# Patient Record
Sex: Female | Born: 1935 | Race: White | Hispanic: No | Marital: Married | Smoking: Never smoker
Health system: Southern US, Community
[De-identification: ages and names within clinical notes are randomized; demographics above are authoritative.]

## PROBLEM LIST (undated history)

## (undated) DIAGNOSIS — L309 Dermatitis, unspecified: Secondary | ICD-10-CM

## (undated) DIAGNOSIS — J449 Chronic obstructive pulmonary disease, unspecified: Secondary | ICD-10-CM

## (undated) DIAGNOSIS — K559 Vascular disorder of intestine, unspecified: Secondary | ICD-10-CM

## (undated) DIAGNOSIS — R42 Dizziness and giddiness: Secondary | ICD-10-CM

## (undated) DIAGNOSIS — K5790 Diverticulosis of intestine, part unspecified, without perforation or abscess without bleeding: Secondary | ICD-10-CM

## (undated) DIAGNOSIS — I4891 Unspecified atrial fibrillation: Secondary | ICD-10-CM

## (undated) DIAGNOSIS — E039 Hypothyroidism, unspecified: Secondary | ICD-10-CM

## (undated) DIAGNOSIS — D649 Anemia, unspecified: Secondary | ICD-10-CM

## (undated) DIAGNOSIS — Z973 Presence of spectacles and contact lenses: Secondary | ICD-10-CM

## (undated) DIAGNOSIS — K589 Irritable bowel syndrome without diarrhea: Secondary | ICD-10-CM

## (undated) DIAGNOSIS — M519 Unspecified thoracic, thoracolumbar and lumbosacral intervertebral disc disorder: Secondary | ICD-10-CM

## (undated) DIAGNOSIS — F419 Anxiety disorder, unspecified: Secondary | ICD-10-CM

## (undated) DIAGNOSIS — F329 Major depressive disorder, single episode, unspecified: Secondary | ICD-10-CM

## (undated) DIAGNOSIS — K222 Esophageal obstruction: Secondary | ICD-10-CM

## (undated) DIAGNOSIS — G5793 Unspecified mononeuropathy of bilateral lower limbs: Secondary | ICD-10-CM

## (undated) DIAGNOSIS — F32A Depression, unspecified: Secondary | ICD-10-CM

## (undated) DIAGNOSIS — K439 Ventral hernia without obstruction or gangrene: Secondary | ICD-10-CM

## (undated) DIAGNOSIS — A048 Other specified bacterial intestinal infections: Secondary | ICD-10-CM

## (undated) DIAGNOSIS — K5010 Crohn's disease of large intestine without complications: Secondary | ICD-10-CM

## (undated) DIAGNOSIS — E785 Hyperlipidemia, unspecified: Secondary | ICD-10-CM

## (undated) DIAGNOSIS — K760 Fatty (change of) liver, not elsewhere classified: Secondary | ICD-10-CM

## (undated) DIAGNOSIS — I509 Heart failure, unspecified: Secondary | ICD-10-CM

## (undated) DIAGNOSIS — I10 Essential (primary) hypertension: Secondary | ICD-10-CM

## (undated) DIAGNOSIS — K220 Achalasia of cardia: Secondary | ICD-10-CM

## (undated) DIAGNOSIS — K219 Gastro-esophageal reflux disease without esophagitis: Secondary | ICD-10-CM

## (undated) DIAGNOSIS — E871 Hypo-osmolality and hyponatremia: Secondary | ICD-10-CM

## (undated) HISTORY — DX: Hypothyroidism, unspecified: E03.9

## (undated) HISTORY — DX: Unspecified atrial fibrillation: I48.91

## (undated) HISTORY — PX: TOTAL ABDOMINAL HYSTERECTOMY: SHX209

## (undated) HISTORY — DX: Gastro-esophageal reflux disease without esophagitis: K22.2

## (undated) HISTORY — DX: Vascular disorder of intestine, unspecified: K55.9

## (undated) HISTORY — DX: Ventral hernia without obstruction or gangrene: K43.9

## (undated) HISTORY — DX: Unspecified thoracic, thoracolumbar and lumbosacral intervertebral disc disorder: M51.9

## (undated) HISTORY — DX: Dermatitis, unspecified: L30.9

## (undated) HISTORY — DX: Other specified bacterial intestinal infections: A04.8

## (undated) HISTORY — PX: WISDOM TOOTH EXTRACTION: SHX21

## (undated) HISTORY — DX: Hyperlipidemia, unspecified: E78.5

## (undated) HISTORY — PX: CARDIAC CATHETERIZATION: SHX172

## (undated) HISTORY — DX: Irritable bowel syndrome, unspecified: K58.9

## (undated) HISTORY — DX: Diverticulosis of intestine, part unspecified, without perforation or abscess without bleeding: K57.90

## (undated) HISTORY — DX: Gastro-esophageal reflux disease without esophagitis: K21.9

## (undated) HISTORY — DX: Heart failure, unspecified: I50.9

## (undated) HISTORY — PX: COLONOSCOPY: SHX174

## (undated) HISTORY — PX: BREAST SURGERY: SHX581

## (undated) HISTORY — DX: Hypo-osmolality and hyponatremia: E87.1

## (undated) HISTORY — DX: Anemia, unspecified: D64.9

## (undated) HISTORY — DX: Achalasia of cardia: K22.0

## (undated) HISTORY — DX: Anxiety disorder, unspecified: F41.9

## (undated) HISTORY — PX: CATARACT EXTRACTION: SUR2

## (undated) HISTORY — DX: Crohn's disease of large intestine without complications: K50.10

## (undated) HISTORY — DX: Fatty (change of) liver, not elsewhere classified: K76.0

---

## 1999-05-16 ENCOUNTER — Emergency Department (HOSPITAL_COMMUNITY): Admission: EM | Admit: 1999-05-16 | Discharge: 1999-05-16 | Payer: Self-pay | Admitting: *Deleted

## 1999-09-13 ENCOUNTER — Emergency Department (HOSPITAL_COMMUNITY): Admission: EM | Admit: 1999-09-13 | Discharge: 1999-09-13 | Payer: Self-pay | Admitting: Emergency Medicine

## 1999-11-02 ENCOUNTER — Ambulatory Visit: Admission: RE | Admit: 1999-11-02 | Discharge: 1999-11-02 | Payer: Self-pay | Admitting: Cardiology

## 2003-04-20 ENCOUNTER — Encounter: Payer: Self-pay | Admitting: Internal Medicine

## 2004-05-08 ENCOUNTER — Encounter: Payer: Self-pay | Admitting: Internal Medicine

## 2005-11-27 ENCOUNTER — Encounter: Payer: Self-pay | Admitting: Internal Medicine

## 2006-12-23 ENCOUNTER — Encounter: Payer: Self-pay | Admitting: Internal Medicine

## 2007-05-05 ENCOUNTER — Encounter: Payer: Self-pay | Admitting: Internal Medicine

## 2008-07-07 ENCOUNTER — Encounter: Payer: Self-pay | Admitting: Internal Medicine

## 2008-07-13 ENCOUNTER — Encounter: Payer: Self-pay | Admitting: Internal Medicine

## 2008-10-07 ENCOUNTER — Ambulatory Visit: Payer: Self-pay | Admitting: Internal Medicine

## 2008-10-22 ENCOUNTER — Encounter: Payer: Self-pay | Admitting: Internal Medicine

## 2008-10-22 ENCOUNTER — Ambulatory Visit: Payer: Self-pay | Admitting: Internal Medicine

## 2008-10-22 DIAGNOSIS — I10 Essential (primary) hypertension: Secondary | ICD-10-CM | POA: Insufficient documentation

## 2008-10-22 DIAGNOSIS — I4891 Unspecified atrial fibrillation: Secondary | ICD-10-CM | POA: Insufficient documentation

## 2008-10-22 DIAGNOSIS — I482 Chronic atrial fibrillation, unspecified: Secondary | ICD-10-CM

## 2008-10-22 DIAGNOSIS — D509 Iron deficiency anemia, unspecified: Secondary | ICD-10-CM | POA: Insufficient documentation

## 2008-10-22 DIAGNOSIS — I4821 Permanent atrial fibrillation: Secondary | ICD-10-CM | POA: Insufficient documentation

## 2008-10-27 ENCOUNTER — Ambulatory Visit: Payer: Self-pay | Admitting: Cardiology

## 2008-11-02 ENCOUNTER — Ambulatory Visit: Payer: Self-pay | Admitting: Internal Medicine

## 2008-11-03 ENCOUNTER — Ambulatory Visit: Payer: Self-pay

## 2008-11-03 ENCOUNTER — Encounter: Payer: Self-pay | Admitting: Internal Medicine

## 2008-11-03 ENCOUNTER — Encounter (INDEPENDENT_AMBULATORY_CARE_PROVIDER_SITE_OTHER): Payer: Self-pay | Admitting: *Deleted

## 2008-11-04 ENCOUNTER — Ambulatory Visit: Payer: Self-pay | Admitting: Internal Medicine

## 2008-11-12 ENCOUNTER — Telehealth (INDEPENDENT_AMBULATORY_CARE_PROVIDER_SITE_OTHER): Payer: Self-pay | Admitting: *Deleted

## 2008-11-26 ENCOUNTER — Ambulatory Visit: Payer: Self-pay | Admitting: Internal Medicine

## 2008-11-26 LAB — CONVERTED CEMR LAB
POC INR: 2.2
Protime: 18.1

## 2008-11-30 ENCOUNTER — Encounter: Payer: Self-pay | Admitting: *Deleted

## 2008-12-24 ENCOUNTER — Ambulatory Visit: Payer: Self-pay | Admitting: Cardiology

## 2008-12-24 LAB — CONVERTED CEMR LAB
POC INR: 2
Prothrombin Time: 17.3 s

## 2009-01-05 ENCOUNTER — Encounter: Payer: Self-pay | Admitting: *Deleted

## 2009-01-20 ENCOUNTER — Ambulatory Visit: Payer: Self-pay | Admitting: Vascular Surgery

## 2009-01-20 ENCOUNTER — Inpatient Hospital Stay (HOSPITAL_COMMUNITY): Admission: EM | Admit: 2009-01-20 | Discharge: 2009-01-22 | Payer: Self-pay | Admitting: Emergency Medicine

## 2009-01-20 ENCOUNTER — Encounter (INDEPENDENT_AMBULATORY_CARE_PROVIDER_SITE_OTHER): Payer: Self-pay | Admitting: Internal Medicine

## 2009-02-01 ENCOUNTER — Ambulatory Visit: Payer: Self-pay | Admitting: Cardiology

## 2009-02-01 LAB — CONVERTED CEMR LAB
POC INR: 3
Prothrombin Time: 21 s

## 2009-02-14 ENCOUNTER — Ambulatory Visit: Payer: Self-pay | Admitting: Internal Medicine

## 2009-02-16 LAB — CONVERTED CEMR LAB
CK-MB: 2.7 ng/mL (ref 0.3–4.0)
Total CK: 114 units/L (ref 7–177)

## 2009-02-22 ENCOUNTER — Ambulatory Visit: Payer: Self-pay | Admitting: Internal Medicine

## 2009-03-01 ENCOUNTER — Ambulatory Visit: Payer: Self-pay | Admitting: Cardiology

## 2009-03-29 ENCOUNTER — Encounter: Admission: RE | Admit: 2009-03-29 | Discharge: 2009-03-29 | Payer: Self-pay | Admitting: Internal Medicine

## 2009-03-29 ENCOUNTER — Ambulatory Visit: Payer: Self-pay | Admitting: Cardiology

## 2009-03-29 LAB — CONVERTED CEMR LAB: POC INR: 2.5

## 2009-03-30 ENCOUNTER — Encounter (HOSPITAL_COMMUNITY): Admission: RE | Admit: 2009-03-30 | Discharge: 2009-07-01 | Payer: Self-pay | Admitting: Internal Medicine

## 2009-04-07 ENCOUNTER — Telehealth: Payer: Self-pay | Admitting: Internal Medicine

## 2009-04-08 ENCOUNTER — Ambulatory Visit: Payer: Self-pay | Admitting: Internal Medicine

## 2009-04-08 LAB — CONVERTED CEMR LAB: POC INR: 1.5

## 2009-04-18 ENCOUNTER — Ambulatory Visit: Payer: Self-pay | Admitting: Cardiovascular Disease

## 2009-04-18 LAB — CONVERTED CEMR LAB: POC INR: 2.3

## 2009-05-02 ENCOUNTER — Ambulatory Visit: Payer: Self-pay | Admitting: Cardiology

## 2009-05-02 LAB — CONVERTED CEMR LAB: POC INR: 3

## 2009-05-23 ENCOUNTER — Ambulatory Visit: Payer: Self-pay | Admitting: Internal Medicine

## 2009-05-23 LAB — CONVERTED CEMR LAB: POC INR: 3

## 2009-06-13 ENCOUNTER — Ambulatory Visit: Payer: Self-pay | Admitting: Cardiovascular Disease

## 2009-06-13 LAB — CONVERTED CEMR LAB: POC INR: 3.1

## 2009-06-27 ENCOUNTER — Telehealth: Payer: Self-pay | Admitting: Internal Medicine

## 2009-06-28 ENCOUNTER — Ambulatory Visit: Payer: Self-pay | Admitting: Internal Medicine

## 2009-07-02 ENCOUNTER — Encounter (HOSPITAL_COMMUNITY): Admission: RE | Admit: 2009-07-02 | Discharge: 2009-08-06 | Payer: Self-pay | Admitting: Internal Medicine

## 2009-07-11 ENCOUNTER — Ambulatory Visit: Payer: Self-pay | Admitting: Internal Medicine

## 2009-07-29 ENCOUNTER — Ambulatory Visit: Payer: Self-pay | Admitting: Internal Medicine

## 2009-08-04 ENCOUNTER — Encounter: Payer: Self-pay | Admitting: Internal Medicine

## 2009-08-08 ENCOUNTER — Ambulatory Visit: Payer: Self-pay | Admitting: Cardiovascular Disease

## 2009-08-08 LAB — CONVERTED CEMR LAB: POC INR: 2.9

## 2009-08-09 ENCOUNTER — Encounter (HOSPITAL_COMMUNITY): Admission: RE | Admit: 2009-08-09 | Discharge: 2009-10-31 | Payer: Self-pay | Admitting: Internal Medicine

## 2009-08-18 ENCOUNTER — Ambulatory Visit: Payer: Self-pay | Admitting: Internal Medicine

## 2009-08-22 ENCOUNTER — Telehealth: Payer: Self-pay | Admitting: Internal Medicine

## 2009-08-22 ENCOUNTER — Ambulatory Visit: Payer: Self-pay | Admitting: Internal Medicine

## 2009-08-22 LAB — CONVERTED CEMR LAB: POC INR: 2.9

## 2009-09-05 ENCOUNTER — Ambulatory Visit: Payer: Self-pay | Admitting: Internal Medicine

## 2009-09-05 LAB — CONVERTED CEMR LAB: POC INR: 1.9

## 2009-09-27 ENCOUNTER — Ambulatory Visit: Payer: Self-pay | Admitting: Internal Medicine

## 2009-09-27 LAB — CONVERTED CEMR LAB: POC INR: 3

## 2009-09-30 ENCOUNTER — Encounter: Payer: Self-pay | Admitting: Internal Medicine

## 2009-10-25 ENCOUNTER — Ambulatory Visit: Payer: Self-pay | Admitting: Cardiovascular Disease

## 2009-10-25 LAB — CONVERTED CEMR LAB: POC INR: 3.2

## 2009-11-01 ENCOUNTER — Ambulatory Visit: Payer: Self-pay | Admitting: Cardiology

## 2009-11-17 ENCOUNTER — Ambulatory Visit: Payer: Self-pay | Admitting: Internal Medicine

## 2009-11-17 LAB — CONVERTED CEMR LAB: POC INR: 1.9

## 2009-12-01 ENCOUNTER — Ambulatory Visit: Payer: Self-pay | Admitting: Internal Medicine

## 2009-12-01 LAB — CONVERTED CEMR LAB: POC INR: 3

## 2009-12-22 ENCOUNTER — Ambulatory Visit: Payer: Self-pay | Admitting: Internal Medicine

## 2009-12-22 LAB — CONVERTED CEMR LAB: POC INR: 3

## 2010-01-19 ENCOUNTER — Ambulatory Visit: Payer: Self-pay | Admitting: Cardiology

## 2010-01-19 LAB — CONVERTED CEMR LAB: POC INR: 3.7

## 2010-02-02 ENCOUNTER — Ambulatory Visit: Payer: Self-pay | Admitting: Cardiovascular Disease

## 2010-02-02 LAB — CONVERTED CEMR LAB: POC INR: 2.6

## 2010-03-02 ENCOUNTER — Ambulatory Visit: Payer: Self-pay | Admitting: Internal Medicine

## 2010-03-02 LAB — CONVERTED CEMR LAB: POC INR: 2.8

## 2010-03-08 ENCOUNTER — Telehealth (INDEPENDENT_AMBULATORY_CARE_PROVIDER_SITE_OTHER): Payer: Self-pay | Admitting: *Deleted

## 2010-03-10 ENCOUNTER — Ambulatory Visit: Payer: Self-pay | Admitting: Cardiology

## 2010-03-10 LAB — CONVERTED CEMR LAB: POC INR: 1.6

## 2010-03-20 ENCOUNTER — Ambulatory Visit: Payer: Self-pay | Admitting: Cardiovascular Disease

## 2010-03-20 LAB — CONVERTED CEMR LAB: POC INR: 1.7

## 2010-03-31 ENCOUNTER — Ambulatory Visit: Payer: Self-pay | Admitting: Internal Medicine

## 2010-04-03 ENCOUNTER — Encounter (INDEPENDENT_AMBULATORY_CARE_PROVIDER_SITE_OTHER): Payer: Self-pay | Admitting: Pharmacist

## 2010-04-03 ENCOUNTER — Ambulatory Visit: Payer: Self-pay | Admitting: Internal Medicine

## 2010-04-04 ENCOUNTER — Encounter: Payer: Self-pay | Admitting: Cardiology

## 2010-04-04 ENCOUNTER — Ambulatory Visit: Payer: Self-pay | Admitting: Internal Medicine

## 2010-04-04 ENCOUNTER — Ambulatory Visit: Payer: Self-pay | Admitting: Cardiology

## 2010-04-04 LAB — CONVERTED CEMR LAB
INR: 1.9
POC INR: 1.9

## 2010-04-11 ENCOUNTER — Encounter: Admission: RE | Admit: 2010-04-11 | Discharge: 2010-04-11 | Payer: Self-pay | Admitting: Internal Medicine

## 2010-04-14 ENCOUNTER — Ambulatory Visit: Payer: Self-pay | Admitting: Internal Medicine

## 2010-04-17 ENCOUNTER — Telehealth: Payer: Self-pay | Admitting: Internal Medicine

## 2010-04-17 LAB — CONVERTED CEMR LAB
Basophils Absolute: 0 10*3/uL (ref 0.0–0.1)
Basophils Relative: 1 % (ref 0.0–3.0)
Eosinophils Absolute: 0.1 10*3/uL (ref 0.0–0.7)
Eosinophils Relative: 2.4 % (ref 0.0–5.0)
HCT: 38.2 % (ref 36.0–46.0)
Hemoglobin: 13.1 g/dL (ref 12.0–15.0)
Lymphocytes Relative: 36.3 % (ref 12.0–46.0)
Lymphs Abs: 1.8 10*3/uL (ref 0.7–4.0)
MCHC: 34.2 g/dL (ref 30.0–36.0)
MCV: 96.5 fL (ref 78.0–100.0)
Monocytes Absolute: 0.6 10*3/uL (ref 0.1–1.0)
Monocytes Relative: 11.5 % (ref 3.0–12.0)
Neutro Abs: 2.4 10*3/uL (ref 1.4–7.7)
Neutrophils Relative %: 48.8 % (ref 43.0–77.0)
Platelets: 250 10*3/uL (ref 150.0–400.0)
RBC: 3.96 M/uL (ref 3.87–5.11)
RDW: 13.1 % (ref 11.5–14.6)
WBC: 4.9 10*3/uL (ref 4.5–10.5)

## 2010-05-30 ENCOUNTER — Ambulatory Visit: Payer: Self-pay | Admitting: Internal Medicine

## 2010-05-31 ENCOUNTER — Ambulatory Visit: Payer: Self-pay | Admitting: Internal Medicine

## 2010-06-01 ENCOUNTER — Telehealth: Payer: Self-pay | Admitting: Internal Medicine

## 2010-06-05 ENCOUNTER — Ambulatory Visit: Payer: Self-pay | Admitting: Internal Medicine

## 2010-06-05 ENCOUNTER — Encounter: Payer: Self-pay | Admitting: Internal Medicine

## 2010-06-13 ENCOUNTER — Telehealth (INDEPENDENT_AMBULATORY_CARE_PROVIDER_SITE_OTHER): Payer: Self-pay | Admitting: *Deleted

## 2010-07-30 LAB — CONVERTED CEMR LAB: POC INR: 2.7

## 2010-08-01 NOTE — Medication Information (Signed)
Summary: rov/eac  Anticoagulant Therapy  Managed by: Eda Keys, PharmD PCP: Dr. Buren Kos PMD, Dr. Sherryl Manges EP/Cards,  Supervising MD: Shirlee Latch MD, Freida Busman Indication 1: Atrial Fibrillation (ICD-427.31) Lab Used: [facility] Site: [facility] INR POC 1.6 INR RANGE 2 - 3  Dietary changes: no    Health status changes: no    Bleeding/hemorrhagic complications: no    Recent/future hospitalizations: no    Any changes in medication regimen? yes       Details: zpak started today 9/9 for dental procedure  Recent/future dental: yes     Details: tooth implant today 9/9  Any missed doses?: yes     Details: not taken in 2 nights because of tooth implant 9/9  Is patient compliant with meds? yes       Allergies: 1)  ! Pcn 2)  ! * Avelox 3)  ! * Protonix  Anticoagulation Management History:      The patient is taking warfarin and comes in today for a routine follow up visit.  Positive risk factors for bleeding include an age of 23 years or older.  The bleeding index is 'intermediate risk'.  Positive CHADS2 values include History of HTN.  Negative CHADS2 values include Age > 92 years old.  The start date was 10/07/2008.  Anticoagulation responsible provider: Shirlee Latch MD, [place].  INR POC: 1.6.  Cuvette Lot#: [id number].  Exp: 05/2011.    Anticoagulation Management Assessment/Plan:      The patient's current anticoagulation dose is Coumadin 2 mg tabs: Take as directed by coumadin clinic..  The target INR is 2 - 3.  The next INR is due 03/17/2010.  Anticoagulation instructions were given to patient.  Results were reviewed/authorized by Eda Keys, PharmD.  She was notified by Kennieth Francois.         Prior Anticoagulation Instructions: INR 2.8  Continue taking 1 tablet every day. Will call with directions for dental procedure.   Current Anticoagulation Instructions: INR 1.6  This evening after your dental procedure, take two tablets today and tomorrow resume one tablet every  day.  We will see you in 7 days.

## 2010-08-01 NOTE — Medication Information (Signed)
Summary: rov/sl  Anticoagulant Therapy  Managed by: Weston Brass, PharmD PCP: Dr. Buren Kos PMD, Dr. Sherryl Manges EP/Cards,  Supervising MD: Graciela Husbands MD,Steven Indication 1: Atrial Fibrillation (ICD-427.31) Lab Used: [facility] Site: [facility] INR POC 2.8 INR RANGE 2 - 3  Dietary changes: yes       Details: Less greens  Health status changes: no    Bleeding/hemorrhagic complications: no    Recent/future hospitalizations: no    Any changes in medication regimen? no    Recent/future dental: yes     Details: Will have 2 implants places on 03/10/10. Needs to hold Coumadin for 3 days and will discuss with Dr. Graciela Husbands.   Any missed doses?: no       Is patient compliant with meds? yes       Allergies: 1)  ! Pcn 2)  ! * Avelox 3)  ! * Protonix  Anticoagulation Management History:      The patient is taking warfarin and comes in today for a routine follow up visit.  Positive risk factors for bleeding include an age of 75 years or older.  The bleeding index is 'intermediate risk'.  Positive CHADS2 values include History of HTN.  Negative CHADS2 values include Age > 52 years old.  The start date was 10/07/2008.  Anticoagulation responsible provider: Graciela Husbands MD,Steven.  INR POC: 2.8.  Cuvette Lot#: [id number].  Exp: 04/2011.    Anticoagulation Management Assessment/Plan:      The patient's current anticoagulation dose is Coumadin 2 mg tabs: Take as directed by coumadin clinic..  The target INR is 2 - 3.  The next INR is due 03/20/2010.  Anticoagulation instructions were given to patient.  Results were reviewed/authorized by Weston Brass, PharmD.  She was notified by Harrel Carina, PharmD Candidate.         Prior Anticoagulation Instructions: INR 2.6  Continue Coumadin 1 tab (2 mg) daily. Return to clinic in 4 weeks.   Current Anticoagulation Instructions: INR 2.8  Continue taking 1 tablet every day. Will call with directions for dental procedure.

## 2010-08-01 NOTE — Assessment & Plan Note (Signed)
Summary: rov ////kp   Visit Type:  Follow-up Copy to:  Dr. Silvestre Mesi Primary Provider/Referring Provider:  Dr. Buren Kos PMD, Dr. Sherryl Manges EP/Cards,   CC:  Pt here for 5 month follow-up. Pt finishes rehab next thursday.  Pt states breathing is much improved.  Pt has been using adviar as needed and wanted to know if she use it daily. [name] Kitchen  History of Present Illness: 34 yowf never smoker with dx of bronchiectasis and dysnea due to bronchiectasis/overweight.  OV 07/29/2009. Last visit was in August 2010. Since then she came into office acutely on Jun 28, 2009 for acute flare. This has since responded well to antibiotics (doxy made her nauseated). In terms of dysonea, she feels significantly better. Attributes this to pulmonary rehab. She spent 5 minutes singing praises about pulmonary rehab. Cough is very rare. She wants to switch advair to symbicort due to hoarseness. She is taking advair prn and has had no flares due to that and is wondering if she can continue the same for symbciort as well. No other specific complaints.  mana  Pt denies any significant sore throat, dysphagia, itching, sneezing, purulent sputum, fever, chills, sweats, unintended wt loss, pleuritic or exertional cp, hempoptysis, change in activity tolerance  orthopnea pnd or leg swelling.  Current Medications (verified): 1)  Prilosec 20 Mg Cpdr (Omeprazole) .... Take  One 30-60 Min Before First Meal of The Day 2)  Coumadin 2 Mg Tabs (Warfarin Sodium) .... Take As Directed By Coumadin Clinic. 3)  Meclizine Hcl 25 Mg Chew (Meclizine Hcl) .... Three Times A Day As Needed 4)  Pravachol 20 Mg Tabs (Pravastatin Sodium) .[name] Kitchen.. 1 Tab At Bedtime 5)  Lorazepam 0.5 Mg Tabs (Lorazepam) .... Three Times A Day As Needed 6)  Levoxyl 50 Mcg Tabs (Levothyroxine Sodium) .[name] Kitchen.. 1 Tab Once Daily 7)  Potassimin 75 Mg Tabs (Potassium) .[name] Kitchen.. 1 Tab Once Daily 8)  Tiazac 300 Mg Xr24h-Cap (Diltiazem Hcl Er Beads) .[name] Kitchen.. 1 Tab Once Daily 9)  Astepro  137 Mcg/spray Soln (Azelastine Hcl) .... 2 Sprays As Needed 10)  Advair Diskus 250-50 Mcg/dose Misc (Fluticasone-Salmeterol) .... Two Times A Day As Needed 11)  Benicar 40 Mg Tabs (Olmesartan Medoxomil) .... Take 1 Tablet By Mouth Once A Day  Allergies (verified): 1)  ! Pcn 2)  ! * Avelox 3)  ! * Protonix  Past History:  Past Medical History: Last updated: 06/28/2009 Bronchiectasis      - PFT 07/13/2008  Fev 1.9L/76%, FVC 2.45L/74, Ratio 79, TLC 121%, DLCO 64% Hypothyroidism Dyslipidemia Eczema Iron deficiency anemia -> Hgb 9.7gm% on 07/13/2008 in [place] -> Hgg 129gm% wiht normal irone levsl and ferritin 10/27/2008 in [place] Recurrent otitis/sinusitis Glaucoma/cataracts Atrial Fib s/p pneumovax - 2007 Normal renal function. Creat 0.7mg % 10/27/2008 #Elevated CK  >July 2010 - ? cause #Admission July 2010 for A Fib RVR  Past Surgical History: Last updated: 11/02/2008 Cataract surgery TAH Herniated Disc-2009 s/p tube in middle ear  Family History: Last updated: 11/01/2008 Father-[employer]er Lung Mother- CVA, hypertension 4 siblings- atrial fib Brother- Lung Ca  Social History: Last updated: 07/29/2009 Married  Tobacco Use - No.  Alcohol Use - no 2 children 4 grandchildren  moved to [place] from [place] 2009 Retired Runner, broadcasting/film/video Grew up in [place]. Moved to [place]. Moved to [place] Feb 2010 due to duaghther living in [place]. Son lives in [place], [place]. Husband works as Production designer, theatre/television/film in [place].  Risk Factors: Smoking Status: never (10/22/2008)  Family History: Reviewed  history from 11/01/2008 and no changes required. Father-[employer]er Lung Mother- CVA, hypertension 4 siblings- atrial fib Brother- Lung Ca  Social History: Reviewed history from 11/02/2008 and no changes required. Married  Tobacco Use - No.  Alcohol Use - no 2 children 4 grandchildren  moved to [place] from [place] 2009 Retired Runner, broadcasting/film/video Grew up in [place].  Moved to [place]. Moved to [place] Feb 2010 due to duaghther living in [place]. Son lives in [place], [place]. Husband works as Production designer, theatre/television/film in [place].  Review of Systems  The patient denies shortness of breath with activity, shortness of breath at rest, productive cough, non-productive cough, coughing up blood, chest pain, irregular heartbeats, acid heartburn, indigestion, loss of appetite, weight change, abdominal pain, difficulty swallowing, sore throat, tooth/dental problems, headaches, nasal congestion/difficulty breathing through nose, sneezing, itching, ear ache, anxiety, depression, hand/feet swelling, joint stiffness or pain, rash, change in color of mucus, and fever.    Vital Signs:  Patient profile:   77 year old female Height:      68 inches Weight:      200 pounds O2 Sat:      97 % on Room air Temp:     97.6 degrees F oral Pulse rate:   85 / minute BP sitting:   120 / 74  (right arm) Cuff size:   regular  Vitals Entered By: Carron Curie CMA (July 29, 2009 11:22 AM)  O2 Flow:  Room air CC: Pt here for 5 month follow-up. Pt finishes rehab next thursday.  Pt states breathing is much improved.  Pt has been using adviar as needed and wanted to know if she use it daily.  Comments Medications reviewed with patient Daytime phone number verified with patient. Carron Curie CMA  July 29, 2009 11:23 AM    Physical Exam  General:  well developed, well nourished, in no acute distress. Overweight Head:  normocephalic and atraumatic Eyes:  PERRLA/EOM intact; conjunctiva and sclera clear Ears:  TMs intact and clear with normal canals Nose:  no deformity, discharge, inflammation, or lesions Mouth:  no deformity or lesions Neck:  no masses, thyromegaly, or abnormal cervical nodes Chest Wall:  no deformities noted Lungs:  clear bilaterally to auscultation and percussion Heart:  regular rate and rhythm, S1, S2 without murmurs, rubs, gallops, or clicks Abdomen:   bowel sounds positive; abdomen soft and non-tender without masses, or organomegaly Msk:  no deformity or scoliosis noted with normal posture Pulses:  pulses normal Extremities:  no clubbing, cyanosis, edema, or deformity noted Neurologic:  CN II-XII grossly intact with normal reflexes, coordination, muscle strength and tone Skin:  intact without lesions or rashes Cervical Nodes:  no significant adenopathy Axillary Nodes:  no significant adenopathy Psych:  alert and cooperative; normal mood and affect; normal attention span and concentration   Impression & Recommendations:  Problem # 1:  SHORTNESS OF BREATH (ICD-786.05) Assessment Improved  Improved  dyspnea due to bronchiectasis and being overweight  plan continue and finish pulmonary rehab  advised maintenance rehab after completion  Orders: Est. Patient Level II ([address])  Problem # 2:  BRONCHIECTASIS WITHOUT ACUTE EXACERBATION (ICD-494.0) Assessment: Unchanged stable disease  lplan switch advair to symbicort per her request okayed use of symbicort as needed if she has frequent flares then will recommend maintenance compliance rov 9 months  Medications Added to Medication List This Visit: 1)  Symbicort 160-4.5 Mcg/act Aero (Budesonide-formoterol fumarate) .... Two puffs twice daily  Patient Instructions: 1)  I am glad  you are doing well 2)  Please change advair to symbicort 2 puff two times a day 3)  Learn how to use inhaler 4)  Continue your other medicines 5)  Return to see me in 9 mnths Prescriptions: ASTEPRO 137 MCG/SPRAY SOLN (AZELASTINE HCL) 2 sprays as needed  #1 x 6   Entered and Authorized by:   Kalman Shan MD   Signed by:   Kalman Shan MD on 07/29/2009   Method used:   Electronically to        [facility] # [phone]* (retail)       [address]       [place], [place]  [address]       Ph: [phone]       Fax: [phone]   RxID:   [id number] SYMBICORT 160-4.5 MCG/ACT  AERO  (BUDESONIDE-FORMOTEROL FUMARATE) Two puffs twice daily  #1 x 9   Entered and Authorized by:   Kalman Shan MD   Signed by:   Kalman Shan MD on 07/29/2009   Method used:   Electronically to        [facility] # [phone]* (retail)       [address]       [place], [place]  [address]       Ph: [phone]       Fax: [phone]   RxID:   [id number]    Immunization History:  Influenza Immunization History:    Influenza:  fluvax 3+ (06/01/2009)  Pneumovax Immunization History:    Pneumovax:  pneumovax (07/04/2006)

## 2010-08-01 NOTE — Medication Information (Signed)
Summary: rov/mc  Anticoagulant Therapy  Managed by: Vashti Hey RN PCP: Dr. Buren Kos PMD, Dr. Sherryl Manges EP/Cards,  Supervising MD: Dietrich Pates MD  PT 18.1  Dietary changes: no    Health status changes: no    Bleeding/hemorrhagic complications: yes       Details: occ blood in stool  Recent/future hospitalizations: yes       Details: lasar surgery on rt eye  Any changes in medication regimen? no    Recent/future dental: yes     Details: due in september  Any missed doses?: no       Is patient compliant with meds? yes       Allergies (verified): 1)  ! Pcn 2)  ! * Avelox 3)  ! * Protonix  Anticoagulation Management History:      Positive risk factors for bleeding include an age of 75 years or older.  The bleeding index is 'intermediate risk'.  Positive CHADS2 values include History of HTN.  Negative CHADS2 values include Age > 81 years old.    Anticoagulation Management Assessment/Plan:      The patient's current anticoagulation dose is Coumadin 2 mg tabs: take as directed.  She is to have a 12/24/2008.  Anticoagulation instructions were given to patient.  Results were reviewed/authorized by Vashti Hey RN.  She was notified by Vashti Hey RN.         Current Anticoagulation Instructions: pt to take coumadin 2mg  daily except 4mg  on Mondays

## 2010-08-01 NOTE — Medication Information (Signed)
Summary: rov/ewj  Anticoagulant Therapy  Managed by: Cloyde Reams, RN, BSN PCP: Dr. Buren Kos PMD, Dr. Sherryl Manges EP/Cards,  Supervising MD: Excell Seltzer MD, Casimiro Needle Indication 1: Atrial Fibrillation (ICD-427.31) Lab Used: [facility] Site: [facility] INR POC 2.9 INR RANGE 2 - 3  Dietary changes: no    Health status changes: no    Bleeding/hemorrhagic complications: no    Recent/future hospitalizations: no    Any changes in medication regimen? no    Recent/future dental: no  Any missed doses?: no       Is patient compliant with meds? yes       Allergies (verified): 1)  ! Pcn 2)  ! * Avelox 3)  ! * Protonix  Anticoagulation Management History:      The patient is taking warfarin and comes in today for a routine follow up visit.  Positive risk factors for bleeding include an age of 75 years or older.  The bleeding index is 'intermediate risk'.  Positive CHADS2 values include History of HTN.  Negative CHADS2 values include Age > 75 years old.  The start date was 10/07/2008.  Anticoagulation responsible provider: Excell Seltzer MD, Casimiro Needle.  INR POC: 2.9.  Cuvette Lot#: [id number].  Exp: 10/2010.    Anticoagulation Management Assessment/Plan:      The patient's current anticoagulation dose is Coumadin 2 mg tabs: Take as directed by coumadin clinic..  The target INR is 2 - 3.  The next INR is due 09/05/2009.  Anticoagulation instructions were given to patient.  Results were reviewed/authorized by Cloyde Reams, RN, BSN.  She was notified by Cloyde Reams RN.         Prior Anticoagulation Instructions: INR 2.7  Continue on same dosage 1 tablet daily except 2 tablets on Mondays.   Recheck in 4 weeks.    Current Anticoagulation Instructions: INR 2.9  Continue on same dosage 1 tablet daily except 2 tablets on Mondays.  Recheck in 4 weeks.

## 2010-08-01 NOTE — Medication Information (Signed)
Summary: rov/tm  Anticoagulant Therapy  Managed by: Charolotte Eke, PharmD PCP: Dr. Buren Kos PMD, Dr. Sherryl Manges EP/Cards,  Supervising MD: Ladona Ridgel MD, Sharlot Gowda Indication 1: Atrial Fibrillation (ICD-427.31) Lab Used: [facility] Site: [facility] INR POC 1.9 INR RANGE 2 - 3  Dietary changes: no    Health status changes: no    Bleeding/hemorrhagic complications: no    Recent/future hospitalizations: no    Any changes in medication regimen? yes       Details: Taking doxycycline 5/11-6/11 after dental procedure  Recent/future dental: no  Any missed doses?: no       Is patient compliant with meds? yes       Current Medications (verified): 1)  Prilosec 20 Mg Cpdr (Omeprazole) .... Take  One 30-60 Min Before First Meal of The Day 2)  Coumadin 2 Mg Tabs (Warfarin Sodium) .... Take As Directed By Coumadin Clinic. 3)  Meclizine Hcl 25 Mg Chew (Meclizine Hcl) .... Three Times A Day As Needed 4)  Pravachol 20 Mg Tabs (Pravastatin Sodium) .[name] Kitchen.. 1 Tab At Bedtime 5)  Lorazepam 0.5 Mg Tabs (Lorazepam) .... Three Times A Day As Needed 6)  Levoxyl 50 Mcg Tabs (Levothyroxine Sodium) .[name] Kitchen.. 1 Tab Once Daily 7)  Potassimin 75 Mg Tabs (Potassium) .[name] Kitchen.. 1 Tab Once Daily 8)  Tiazac 300 Mg Xr24h-Cap (Diltiazem Hcl Er Beads) .[name] Kitchen.. 1 Tab Once Daily 9)  Astepro 137 Mcg/spray Soln (Azelastine Hcl) .... 2 Sprays As Needed 10)  Benicar 40 Mg Tabs (Olmesartan Medoxomil) .... Take 1 Tablet By Mouth Once A Day 11)  Symbicort 80-4.5 Mcg/act Aero (Budesonide-Formoterol Fumarate) .... 2 Puffs Daily As Needed  Allergies (verified): 1)  ! Pcn 2)  ! * Avelox 3)  ! * Protonix  Anticoagulation Management History:      The patient is taking warfarin and comes in today for a routine follow up visit.  Positive risk factors for bleeding include an age of 75 years or older.  The bleeding index is 'intermediate risk'.  Positive CHADS2 values include History of HTN.  Negative CHADS2 values include Age > 75 years old.   The start date was 10/07/2008.  Anticoagulation responsible provider: Ladona Ridgel MD, Sharlot Gowda.  INR POC: 1.9.  Cuvette Lot#: [id number].  Exp: 12/2010.    Anticoagulation Management Assessment/Plan:      The patient's current anticoagulation dose is Coumadin 2 mg tabs: Take as directed by coumadin clinic..  The target INR is 2 - 3.  The next INR is due 12/01/2009.  Anticoagulation instructions were given to patient.  Results were reviewed/authorized by Charolotte Eke, PharmD.  She was notified by Charolotte Eke, PharmD.         Prior Anticoagulation Instructions: INR 2.1 Resume 1 pill everyday except 2 pills on Mondays. Recheck in 2-3 weeks.   Current Anticoagulation Instructions: The patient is to continue with the same dose of coumadin.  This dosage includes: 2mg  daily except 4mg  on Mondays.

## 2010-08-01 NOTE — Assessment & Plan Note (Signed)
Summary: bronchitis & Bronchiactesis/apc   Visit Type:  Initial Consult Copy to:  Dr. Silvestre Mesi Primary Provider/Referring Provider:  Dr. Buren Kos PMD, Dr. Sherryl Manges EP/Cards,   CC:  Pt here for pulmonary consult for Bronchiectasis.  History of Present Illness: IOV 5/4/20010: 77 year old lady. SHe has relocated from [place], [place] to [place]. She is here at pulmonary to establish care for bronchiectasis. Hx dates back to 2000 when she was diagnosed with Atrial Fibrillation. She recollects being on very high doses of amiodarone and then later Tambicor. Subsequently had cough. Started seeing Dr. Nichola Sizer and was diagnosed with possibly bilateral bronchiectasis. She feels bronchiectasis never came to light till atrial fibrillation developed although she has had chronic cough prior to that. She believes etiology for bronchiectasis was idiopathic. REcollects s/p bronchsscopy in 2005 or so, but negative for tumor/AFB. In terms of flares, between 2000-2005 has had multiple flares that needed opd abx/prednisone. Since 2007 only had 1-2 courses of prednisone and overall is doing much better without flares. She believes one reason for improvement since 2006-2007 was a combination of good [place] weather and being on stable cardiac regimen. At baselin: she has 'very little' dyspnea, cough, sputum., wheeze. In Feb 2010 relocated to [place] and with pollen season she had flare (wheeze, colored sputum, sinusitis) 3 weeks ago. Dr. Clelia Croft prescribed bactrim/cough medicines in addition to baseline advair and astepro. She belivs she is coming out off the flare now. Currently on D4/7 biaxin. Of note, she does not use advair on regular basis. She has also periodic sinus flares. PFts 07/13/2008 shows restricted spirometry but air trapping on lung volumes and low DLCO. Specifically Fev1 1.9L/76%, FVC 2.45L/74%, Ratio 79, TLC 121%, RV 169%, DLCO 28.6.64%  Hx from patient, Dr. Alver Fisher notes and [place] notes.    Current Medications (verified): 1)  Prilosec 20 Mg Cpdr (Omeprazole) .[name] Kitchen.. 1 Tab Once Daily 2)  Hydralazine Hcl 50 Mg Tabs (Hydralazine Hcl) .[name] Kitchen.. 1 Tablet Three Times A Day 3)  Coumadin 2 Mg Tabs (Warfarin Sodium) .... Take As Directed 4)  Meclizine Hcl 25 Mg Chew (Meclizine Hcl) .... Three Times A Day As Needed 5)  Pravachol 20 Mg Tabs (Pravastatin Sodium) .[name] Kitchen.. 1 Tab At Bedtime 6)  Lorazepam 0.5 Mg Tabs (Lorazepam) .... Three Times A Day As Needed 7)  Levoxyl 50 Mcg Tabs (Levothyroxine Sodium) .[name] Kitchen.. 1 Tab Once Daily 8)  Potassimin 75 Mg Tabs (Potassium) .[name] Kitchen.. 1 Tab Once Daily 9)  Tiazac 300 Mg Xr24h-Cap (Diltiazem Hcl Er Beads) .[name] Kitchen.. 1 Tab Once Daily 10)  Astepro 137 Mcg/spray Soln (Azelastine Hcl) .... 2 Sprays As Needed 11)  Advair Diskus 250-50 Mcg/dose Misc (Fluticasone-Salmeterol) .... Two Times A Day As Needed 12)  Benicar 40 Mg Tabs (Olmesartan Medoxomil) .... Take 1 Tablet By Mouth Once A Day 13)  Bactrim Ds 800-160 Mg Tabs (Sulfamethoxazole-Trimethoprim) .... Two Times A Day For 7 Days  Allergies (verified): 1)  ! Pcn 2)  ! * Avelox 3)  ! * Protonix  Past History:  Past Medical History:    Bronchiectasis    Hypothyroidism    Dyslipidemia    Eczema    Iron deficiency anemia    -> Hgb 9.7gm% on 07/13/2008 in [place]    -> Hgg 129gm% wiht normal irone levsl and ferritin 10/27/2008 in [place]    Recurrent otitis/sinusitis    Glaucoma/cataracts    Atrial Fib    s/p pneumovax - 2007    Normal renal function. Creat 0.7mg % 10/27/2008  Past  Surgical History:    Cataract surgery    TAH    Herniated Disc-2009    s/p tube in middle ear  Social History:    Married     Tobacco Use - No.     Alcohol Use - no    2 children 4 grandchildren    Recently moved to [place] from [place]    Retired Runner, broadcasting/film/video     Grew up in [place]. Moved to [place]. Moved to [place] Feb 2010 due to duaghther living in [place]. Son lives in [place], [place]. Husband works as Production designer, theatre/television/film in [place].  Review of Systems       The patient complains of shortness of breath with activity, productive cough, irregular heartbeats, acid heartburn, indigestion, headaches, nasal congestion/difficulty breathing through nose, sneezing, anxiety, and depression.  The patient denies shortness of breath at rest, non-productive cough, coughing up blood, chest pain, loss of appetite, weight change, abdominal pain, sore throat, tooth/dental problems, ear ache, hand/feet swelling, joint stiffness or pain, rash, change in color of mucus, and fever.    Vital Signs:  Patient profile:   77 year old female Height:      68 inches Weight:      196.25 pounds O2 Sat:      96 % Temp:     98.2 degrees F oral Pulse rate:   90 / minute BP sitting:   132 / 78  (right arm) Cuff size:   regular  Vitals Entered By: Carron Curie CMA (Nov 02, 2008 10:58 AM)  O2 Sat at Rest %:  96 O2 Flow:  room air CC: Pt here for pulmonary consult for Bronchiectasis Comments Medications reviewed with patient Carron Curie CMA  Nov 02, 2008 11:14 AM    Physical Exam  General:  well developed, well nourished, in no acute distress. Overweight Head:  normocephalic and atraumatic Eyes:  PERRLA/EOM intact; conjunctiva and sclera clear Ears:  TMs intact and clear with normal canals Nose:  no deformity, discharge, inflammation, or lesions Mouth:  no deformity or lesions Neck:  no masses, thyromegaly, or abnormal cervical nodes Chest Wall:  no deformities noted Lungs:  clear bilaterally to auscultation and percussion Heart:  regular rate and rhythm, S1, S2 without murmurs, rubs, gallops, or clicks Abdomen:  bowel sounds positive; abdomen soft and non-tender without masses, or organomegaly  Msk:  no deformity or scoliosis noted with normal posture Pulses:  pulses normal Extremities:  no clubbing, cyanosis, edema, or deformity noted Neurologic:  CN II-XII grossly intact with normal reflexes, coordination, muscle strength and tone Skin:  intact without lesions or rashes Cervical Nodes:  no significant adenopathy Axillary Nodes:  no significant adenopathy Psych:  alert and cooperative; normal mood and affect; normal attention span and concentration   Impression & Recommendations:  Problem # 1:  BRONCHIECTASIS WITHOUT ACUTE EXACERBATION (ICD-494.0) Assessment New She is here to establish care for bronchiectasis NOS. Currently improving from pollen related exacerbation and is nearly at baseline. From he hr history, and review of outside notes she seems to have dry bronchiectasis. PFTs Jan 2010 show evidence of airtrapping/hyperinflation and lowered diffusion that could along with this. At this poinst she is stable on daily advair regimen. I have nothing specific to offer today. We should get her old PFTs and CT scans (actual films) and copy of etiologic workup. I have asked her to sign release for this. Wil otherwise see her in 2-3 month time. She should finish her bactrim  course  Medications Added to Medication List This Visit: 1)  Bactrim Ds 800-160 Mg Tabs (Sulfamethoxazole-trimethoprim) .... Two times a day for 7 days  Other Orders: Consultation Level V (25956)  Patient Instructions: 1)  please sign release to get actual CT scan and all your PFTs from [place] 2)  return to office in 3 months 3)  in interim, if you have symptoms of flare call us or come to see [place] sooner

## 2010-08-01 NOTE — Progress Notes (Signed)
Summary: lab results  Phone Note Call from Patient Call back at Home Phone [phone]   Caller: Patient Reason for Call: Lab or Test Results Initial call taken by: Judie Grieve,  April 17, 2010 2:06 PM  Follow-up for Phone Call        spoke with pt and she has been taking pradaxa since 10.4.11 and no problems. see where cbc was ordered by coumadin clinic and adv cbc labs were fine. not sure why bmp not ordered. will investigate. Follow-up by: Claris Gladden RN,  April 17, 2010 2:21 PM  Additional Follow-up for Phone Call Additional follow up Details #1::        bmp not ordered since baseline renal func established. adv pt can continue on pradaxa and will call in prescription. Additional Follow-up by: Claris Gladden RN,  April 17, 2010 2:27 PM    New/Updated Medications: PRADAXA 150 MG CAPS (DABIGATRAN ETEXILATE MESYLATE) two times a day Prescriptions: PRADAXA 150 MG CAPS (DABIGATRAN ETEXILATE MESYLATE) two times a day  #60 x 11   Entered by:   Claris Gladden RN   Authorized by:   Nathen May, MD, [facility]   Signed by:   Claris Gladden RN on 04/17/2010   Method used:   Electronically to        [facility] # [phone]* (retail)       [address]       [place], [place]  [address]       Ph: [phone]       Fax: [phone]   RxID:   [id number]

## 2010-08-01 NOTE — Progress Notes (Signed)
Summary: NASAL MUCUS - needs ov  Phone Note Call from Patient   Caller: Patient Call For: RAMASWAMY Summary of Call: NEED PRESCRIPT FOR NASAL MUCUS SOB NEED REFILL FOR ADVAIR [address] [phone] Initial call taken by: Rickard Patience,  June 27, 2009 10:53 AM  Follow-up for Phone Call         MR patient--Pt c/o nasal congestion, headache and increased SOB and wheezing x 2 days. Pt has productive cough but phlegm is clear. pt request abx to have on hold at the pharmacy in case symptoms get worse. MR out of office will forward to doc of the day.  Please advise. Allergies: PCN, Avelox, Protonix. Carron Curie CMA  June 27, 2009 11:23 AM too many allergies to try empiric abx here, needs to be seen if breathing worse   Follow-up by: Nyoka Cowden MD,  June 27, 2009 12:57 PM  Additional Follow-up for Phone Call Additional follow up Details #1::        pt scheduled to see MW 06/28/09 at 12 noon.Carron Curie CMA  June 27, 2009 1:08 PM

## 2010-08-01 NOTE — Letter (Signed)
Summary: OV/[place] [facility]  OV/[place] [facility]   Imported By: Sherian Rein 11/25/2008 15:01:11  _____________________________________________________________________  External Attachment:    Type:   Image     Comment:   External Document

## 2010-08-01 NOTE — Medication Information (Signed)
Summary: ROV/AMR  Anticoagulant Therapy  Managed by: Louann Sjogren, PharmD PCP: Dr. Buren Kos PMD, Dr. Sherryl Manges EP/Cards,  Supervising MD: Shirlee Latch MD, Freida Busman Indication 1: Atrial Fibrillation (ICD-427.31) Lab Used: [facility] Site: [facility] INR POC 1.7 INR RANGE 2 - 3  Dietary changes: no    Health status changes: no    Bleeding/hemorrhagic complications: no    Recent/future hospitalizations: no    Any changes in medication regimen? yes       Details: Z-pack for dental prophylaxis from 9/9-9/14  Recent/future dental: yes     Details: Dental implants on 03/10/2010; restarted Coumadin that evening (2 days off prior)  Any missed doses?: no       Is patient compliant with meds? yes       Current Medications (verified): 1)  Prilosec 20 Mg Cpdr (Omeprazole) .... Take  One 30-60 Min Before First Meal of The Day 2)  Coumadin 2 Mg Tabs (Warfarin Sodium) .... Take As Directed By Coumadin Clinic. 3)  Meclizine Hcl 25 Mg Chew (Meclizine Hcl) .... Three Times A Day As Needed 4)  Pravachol 20 Mg Tabs (Pravastatin Sodium) .[name] Kitchen.. 1 Tab At Bedtime 5)  Lorazepam 0.5 Mg Tabs (Lorazepam) .... Three Times A Day As Needed 6)  Levoxyl 50 Mcg Tabs (Levothyroxine Sodium) .[name] Kitchen.. 1 Tab Once Daily 7)  Potassimin 75 Mg Tabs (Potassium) .[name] Kitchen.. 1 Tab Once Daily 8)  Tiazac 300 Mg Xr24h-Cap (Diltiazem Hcl Er Beads) .[name] Kitchen.. 1 Tab Once Daily 9)  Astepro 137 Mcg/spray Soln (Azelastine Hcl) .... 2 Sprays As Needed 10)  Benicar 40 Mg Tabs (Olmesartan Medoxomil) .... Take 1 Tablet By Mouth Once A Day 11)  Symbicort 80-4.5 Mcg/act Aero (Budesonide-Formoterol Fumarate) .... 2 Puffs Daily As Needed  Allergies (verified): 1)  ! Pcn 2)  ! * Avelox 3)  ! * Protonix  Anticoagulation Management History:      The patient is taking warfarin and comes in today for a routine follow up visit.  Positive risk factors for bleeding include an age of 75 years or older.  The bleeding index is 'intermediate risk'.   Positive CHADS2 values include History of HTN.  Negative CHADS2 values include Age > 86 years old.  The start date was 10/07/2008.  Anticoagulation responsible provider: Shirlee Latch MD, [place].  INR POC: 1.7.  Cuvette Lot#: [id number].  Exp: 05/2011.    Anticoagulation Management Assessment/Plan:      The patient's current anticoagulation dose is Coumadin 2 mg tabs: Take as directed by coumadin clinic..  The target INR is 2 - 3.  The next INR is due 04/03/2010.  Anticoagulation instructions were given to patient.  Results were reviewed/authorized by Louann Sjogren, PharmD.         Prior Anticoagulation Instructions: INR 1.6  This evening after your dental procedure, take two tablets today and tomorrow resume one tablet every day.  We will see you in 7 days.  Current Anticoagulation Instructions: 03/20/2010 INR 1.7  Increase your dose tonight and tomorrow night to 2 tablets (4mg  each night) then continue your normal schedule of 1 tablet a day (2mg ).

## 2010-08-01 NOTE — Assessment & Plan Note (Signed)
Summary: PER CHECK OT/SF   Referring Provider:  Dr. Silvestre Mesi Primary Provider:  Dr. Buren Kos PMD, Dr. Sherryl Manges EP/Cards,   CC:  pt still having episodes intermittently that are causing her to have dizziness.  She has been taking more of the Meclizine.  [name] Kitchen  History of Present Illness: Stephanie Cortez is seen in followup for long-standing history of atrial fibrillation dating back about one decade. She currently is in atrial fibrillation that is permanent. She has failed multiple cardioversions and anti arrhythmic drug trials with flecainide, amiodarone, and sotalol. He has been left in atrial fibrillation and currently she is largely asymptomatic. She denies palpitations. She denies exercise intolerance.  Thromboembolic risk factors include hypertension, gender, and age.  Her cardiac evaluation in the past has included an echo in 2005 notable for mitral regurgitation and tricuspid regurgitation both of which were rated as moderate. She had left atrial enlargement and normal left ventricular function. Multiple Myoview scans have demonstrated normal left ventricular function and no ischemia.  he is doing quite well. She is one pulmonary rehabilitation. She will continue to work on exercise and weight loss.   Current Medications (verified): 1)  Prilosec 20 Mg Cpdr (Omeprazole) .... Take  One 30-60 Min Before First Meal of The Day 2)  Coumadin 2 Mg Tabs (Warfarin Sodium) .... Take As Directed By Coumadin Clinic. 3)  Meclizine Hcl 25 Mg Chew (Meclizine Hcl) .... Three Times A Day As Needed 4)  Pravachol 20 Mg Tabs (Pravastatin Sodium) .[name] Kitchen.. 1 Tab At Bedtime 5)  Lorazepam 0.5 Mg Tabs (Lorazepam) .... Three Times A Day As Needed 6)  Levoxyl 50 Mcg Tabs (Levothyroxine Sodium) .[name] Kitchen.. 1 Tab Once Daily 7)  Potassimin 75 Mg Tabs (Potassium) .[name] Kitchen.. 1 Tab Once Daily 8)  Tiazac 300 Mg Xr24h-Cap (Diltiazem Hcl Er Beads) .[name] Kitchen.. 1 Tab Once Daily 9)  Astepro 137 Mcg/spray Soln (Azelastine Hcl) .... 2 Sprays  As Needed 10)  Benicar 40 Mg Tabs (Olmesartan Medoxomil) .... Take 1 Tablet By Mouth Once A Day 11)  Symbicort 80-4.5 Mcg/act Aero (Budesonide-Formoterol Fumarate) .... 2 Puffs Daily As Needed  Allergies (verified): 1)  ! Pcn 2)  ! * Avelox 3)  ! * Protonix  Past History:  Past Medical History: Last updated: 06/28/2009 Bronchiectasis      - PFT 07/13/2008  Fev 1.9L/76%, FVC 2.45L/74, Ratio 79, TLC 121%, DLCO 64% Hypothyroidism Dyslipidemia Eczema Iron deficiency anemia -> Hgb 9.7gm% on 07/13/2008 in [place] -> Hgg 129gm% wiht normal irone levsl and ferritin 10/27/2008 in [place] Recurrent otitis/sinusitis Glaucoma/cataracts Atrial Fib s/p pneumovax - 2007 Normal renal function. Creat 0.7mg % 10/27/2008 #Elevated CK  >July 2010 - ? cause #Admission July 2010 for A Fib RVR  Past Surgical History: Last updated: 11/02/2008 Cataract surgery TAH Herniated Disc-2009 s/p tube in middle ear  Family History: Last updated: 11/01/2008 Father-[employer] Lung Mother- CVA, hypertension 4 siblings- atrial fib Brother- Lung Ca  Social History: Last updated: 07/29/2009 Married  Tobacco Use - No.  Alcohol Use - no 2 children 4 grandchildren  moved to [place] from [place] 2009 Retired Runner, broadcasting/film/video Grew up in [place]. Moved to [place]. Moved to [place] Feb 2010 due to duaghther living in [place]. Son lives in [place], [place]. Husband works as Production designer, theatre/television/film in [place].  Vital Signs:  Patient profile:   77 year old female Height:      68 inches Weight:      195 pounds Pulse rhythm:   regular BP sitting:  126 / 70  (left arm) Cuff size:   large  Vitals Entered By: Judithe Modest CMA (September 27, 2009 2:41 PM)  Physical Exam  General:  The patient was alert and oriented in no acute distress.Neck veins were flat, carotids were brisk. Lungs were clear. Heart sounds were irregular without murmurs or gallops. Abdomen was soft with active bowel sounds. There is no clubbing  cyanosis or edema.    Impression & Recommendations:  Problem # 1:  OVERWEIGHT (ICD-278.02) we have discussed multiple  strategies to try to attempt weight loss spending greater than 50% of our 32 minute visit on this. we discussed exercise routines as well as dietary bifurcations and behavioral changes  Problem # 2:  ATRIAL FIBRILLATION (ICD-427.31) atrial fibrillation is permanent. Her updated medication list for this problem includes:    Coumadin 2 Mg Tabs (Warfarin sodium) .[name] Kitchen... Take as directed by coumadin clinic.  Orders: EKG w/ Interpretation (93000)

## 2010-08-01 NOTE — Medication Information (Signed)
Summary: Coumadin Clinic  Anticoagulant Therapy  Managed by: Inactive PCP: Dr Marlan Palau Supervising MD: Jens Som MD, Arlys John Indication 1: Atrial Fibrillation (ICD-427.31) Lab Used: [facility] Site: [facility] INR RANGE 2 - 3          Comments: Changed to Pradaxa per Dr. Graciela Husbands  Allergies: 1)  ! Pcn 2)  ! * Avelox 3)  ! * Protonix  Anticoagulation Management History:      Positive risk factors for bleeding include an age of 75 years or older.  The bleeding index is 'intermediate risk'.  Positive CHADS2 values include History of HTN.  Negative CHADS2 values include Age > 65 years old.  The start date was 10/07/2008.  Her last INR was 1.9.  Anticoagulation responsible provider: Jens Som MD, Arlys John.  Exp: 05/2011.    Anticoagulation Management Assessment/Plan:      The target INR is 2 - 3.  The next INR is due 04/03/2010.  Anticoagulation instructions were given to patient.  Results were reviewed/authorized by Inactive.         Prior Anticoagulation Instructions: INR 1.9  Stop Coumadin.  Change to Pradaxa 150mg  two times a day.  Lab work in 7-10 days.

## 2010-08-01 NOTE — Progress Notes (Signed)
Summary: pt having bleeding in ear  Phone Note Call from Patient   Caller: Patient [phone] Reason for Call: Talk to Nurse Summary of Call: pt having bleeding in ear for about three days, subsided some now, denies pain-wants dr Graciela Husbands to see her, feels it's due to pradaxa-pls call Initial call taken by: Glynda Jaeger,  June 01, 2010 11:04 AM  Follow-up for Phone Call        Saw the NP @ Dr. Jane Canary office and she noticed bleeding in her ear canal.  Notified her primary MD who confirmed that her ear was bleeding in the canal with no sign of infection or trauma. She was put on ear drops Neo-Poly-HC. She isn't noticing any other signs of active bleeding & notices that it may be decreasing. Advised her to continue monitoring for any other signs of active bleeding since she is on Pradaxa and to call us if she has any further problems but for now to continue taking Pradaxa.  She was to f/u with Graciela Husbands on Jan.3rd, 2012 but would really like to see him before so I have scheduled her for Monday 12/5 @ 11:15am.  She is very nervous about taking the Pradaxa b/c of the bleeding in her ear canal.  Ellender Hose RN  June 01, 2010 11:35 AM  Follow-up by: Whitney Maeola Sarah RN,  June 01, 2010 11:35 AM

## 2010-08-01 NOTE — Medication Information (Signed)
Summary: rov/ewj  Anticoagulant Therapy  Managed by: Cloyde Reams, RN, BSN PCP: Dr. Buren Kos PMD, Dr. Sherryl Manges EP/Cards,  Supervising MD: Gala Romney MD, Reuel Boom Indication 1: Atrial Fibrillation (ICD-427.31) Lab Used: [facility] Site: [facility] INR POC 3.0 INR RANGE 2 - 3  Dietary changes: no    Health status changes: no    Bleeding/hemorrhagic complications: no    Recent/future hospitalizations: no    Any changes in medication regimen? no    Recent/future dental: no  Any missed doses?: no       Is patient compliant with meds? yes      Comments: Seeing Dr Graciela Husbands today, wishes to discuss Pradaxa.  Allergies: 1)  ! Pcn 2)  ! * Avelox 3)  ! * Protonix  Anticoagulation Management History:      Positive risk factors for bleeding include an age of 75 years or older.  The bleeding index is 'intermediate risk'.  Positive CHADS2 values include History of HTN.  Negative CHADS2 values include Age > 75 years old.  The start date was 10/07/2008.  Anticoagulation responsible provider: Bensimhon MD, Reuel Boom.  INR POC: 3.0.  Exp: 10/2010.    Anticoagulation Management Assessment/Plan:      The patient's current anticoagulation dose is Coumadin 2 mg tabs: Take as directed by coumadin clinic..  The target INR is 2 - 3.  The next INR is due 10/25/2009.  Anticoagulation instructions were given to patient.  Results were reviewed/authorized by Cloyde Reams, RN, BSN.  She was notified by Cloyde Reams RN.         Prior Anticoagulation Instructions: INR 1.9  Take 2 tablets today and stay on current dosage 1 tablet daily except 2 tablets on Mondays.  Recheck in 4 weeks.     Current Anticoagulation Instructions: INR 3.0  Continue on same dosage 1 tablet daily except 2 tablets on Mondays.  Recheck in 4 weeks.

## 2010-08-01 NOTE — Progress Notes (Signed)
  Phone Note Call from Patient   Caller: Patient Call For: CVRR  Summary of Call: Pt telephoned states she is having implants on Friday and wants to know if Dr Bradly Chris have everything he needs for her procedure. I telephoned that office and spoke with Judeth Cornfield and she states that normally  he wants INR between 2.0-2.5, thus would like pt redraw.Thus, telephoned pt and informed her to hold per Pharm D instructions for 3 days and come in on Friday for INR at 8am. Understanding explained. Peter Garter fax # [phone] Initial call taken by: Bethena Midget, RN, BSN,  March 08, 2010 3:16 PM

## 2010-08-01 NOTE — Assessment & Plan Note (Signed)
Summary: f46m/wpa   Visit Type:  3 mo f/u Referring Provider:  Dr. Silvestre Mesi Primary Provider:  Dr Marlan Palau  CC:  sob but say she has a cold right now and is congested from that....edema/ankles...denies any cp..pt states she has a little bleeding in her right ear and is now on ear gtts.  History of Present Illness: Stephanie Cortez is seen in followup for long-standing history of atrial fibrillation dating back about one decade. She currently is in atrial fibrillation that is permanent. She has failed multiple cardioversions and anti arrhythmic drug trials with flecainide, amiodarone, and sotalol. She has been left in atrial fibrillation and currently she is largely asymptomatic. She denies palpitations. She denies exercise intolerance.  Thromboembolic risk factors include hypertension, gender, and age.  Her cardiac evaluation in the past has included an echo in 2005 notable for mitral regurgitation and tricuspid regurgitation both of which were rated as moderate. She had left atrial enlargement and normal left ventricular function. Multiple Myoview scans have demonstrated normal left ventricular function and no ischemia.   She is nonproductive. She went to go see pulmonary because of coughing. She was started on an antibiotic with improvement. However, on examination she is found to have blood in her ear canal. She is intercurrently seen Dr. Lenord Fellers who encouraged her to come see [place] regarding the role of the blood thinner.  Current Medications (verified): 1)  Prilosec 20 Mg Cpdr (Omeprazole) .... Take  One 30-60 Min Before First Meal of The Day 2)  Meclizine Hcl 25 Mg Chew (Meclizine Hcl) .... Three Times A Day As Needed 3)  Pravachol 20 Mg Tabs (Pravastatin Sodium) .[name] Kitchen.. 1 Tab At Bedtime 4)  Lorazepam 0.5 Mg Tabs (Lorazepam) .... Three Times A Day As Needed 5)  Synthroid 50 Mcg Tabs (Levothyroxine Sodium) .... Take 1 Tablet By Mouth Once A Day 6)  Potassimin 75 Mg Tabs (Potassium) .[name] Kitchen.. 1 Tab  Once Daily 7)  Tiazac 300 Mg Xr24h-Cap (Diltiazem Hcl Er Beads) .[name] Kitchen.. 1 Tab Once Daily 8)  Benicar 40 Mg Tabs (Olmesartan Medoxomil) .... Take 1 Tablet By Mouth Once A Day 9)  Symbicort 80-4.5 Mcg/act Aero (Budesonide-Formoterol Fumarate) .... 2 Puffs Daily As Needed 10)  Pradaxa 150 Mg Caps (Dabigatran Etexilate Mesylate) .... Two Times A Day 11)  Cefdinir 300 Mg Caps (Cefdinir) .[name] Kitchen.. 1 By Mouth Two Times A Day 12)  Multivitamins   Tabs (Multiple Vitamin) .[name] Kitchen.. 1 Tab Once Daily 13)  Ear Drops 6.5 % Soln (Carbamide Peroxide) .... 4 Gtts Right Ear Four Times Daily..  Allergies: 1)  ! Pcn 2)  ! * Avelox 3)  ! * Protonix  Past History:  Past Medical History: Last updated: 04/04/2010 Bronchiectasis      - PFT 07/13/2008  Fev 1.9L/76%, FVC 2.45L/74, Ratio 79, TLC 121%, DLCO 64% AE BRonchiectasis  - Dec 2010.New Rx:  outpatient  - Feb 2011 - Rx outpatient Hypothyroidism Dyslipidemia Eczema Iron deficiency anemia - Hgb 9.7gm% on 07/13/2008 in [place] -  Hgg 129gm% wiht normal irone levsl and ferritin 10/27/2008 in [place] Recurrent otitis/sinusitis Glaucoma/cataracts Atrial Fib s/p pneumovax - 2007 Normal renal function. Creat 0.7mg % 10/27/2008 #Elevated CK   - July 2010 - ? cause #Admission July 2010 for A Fib RVR  Vital Signs:  Patient profile:   77 year old female Height:      68 inches Weight:      191 pounds BMI:     29.15 Pulse rate:   81 / minute Pulse rhythm:  irregular BP sitting:   122 / 56  (right arm) Cuff size:   large  Vitals Entered By: Danielle Rankin, CMA (June 05, 2010 11:33 AM)  Physical Exam  General:  The patient was alert and oriented in no acute distress.Neck veins were flat, carotids were brisk. Lungs were clear. Heart sounds were irregular without murmurs or gallops. Abdomen was soft with active bowel sounds. There is no clubbing cyanosis or edema. her ear canal is noted to have blood in the inferior aspect on near the membrane. The right-sided tympanic  membrane was scarred   EKG  Procedure date:  06/05/2010  Findings:      afib ratre controlled  Impression & Recommendations:  Problem # 1:  ATRIAL FIBRILLATION (ICD-427.31) atrial fibrillation is permanent. She is adequately rate controlled and without symptoms. The issue on the table relates to her blood thinner. I have called and rescheduled her appointment to see Dr. Dorma Russell  to examine her ears  Patient Instructions: 1)  Your physician recommends that you continue on your current medications as directed. Please refer to the Current Medication list given to you today. 2)  Your physician wants you to follow-up in: 6 months  You will receive a reminder letter in the mail two months in advance. If you don't receive a letter, please call our office to schedule the follow-up appointment.

## 2010-08-01 NOTE — Assessment & Plan Note (Signed)
Summary: [facility]   CC:  nep/afib.  History of Present Illness: Stephanie Cortez is seen to establish cardiac followup.  She has a long-standing history of atrial fibrillation dating back about one decade. She currently is in atrial fibrillation that is permanent. She has failed multiple cardioversions and anti arrhythmic drug trials with flecainide, amiodarone, and sotalol. He has been left in atrial fibrillation and currently she is largely asymptomatic. She denies palpitations. She denies exercise intolerance.  Thromboembolic risk factors include hypertension, gender, and age.  Her cardiac evaluation in the past has included an echo in 2005 notable for mitral regurgitation and tricuspid regurgitation both of which were rated as moderate. She had left atrial enlargement and normal left ventricular function. Multiple Myoview scans have demonstrated normal left ventricular function and no ischemia.  Current Medications (verified): 1)  Prilosec 20 Mg Cpdr (Omeprazole) .[name] Kitchen.. 1 Tab Once Daily 2)  Hydralazine Hcl 50 Mg Tabs (Hydralazine Hcl) .[name] Kitchen.. 1 Tablet Three Times A Day 3)  Benicar Hct 40-25 Mg Tabs (Olmesartan Medoxomil-Hctz) .[name] Kitchen.. 1 Tab Daily 4)  Coumadin 2 Mg Tabs (Warfarin Sodium) .... Take As Directed 5)  Meclizine Hcl 25 Mg Chew (Meclizine Hcl) .... Three Times A Day As Needed 6)  Pravachol 20 Mg Tabs (Pravastatin Sodium) .[name] Kitchen.. 1 Tab At Bedtime 7)  Lorazepam 0.5 Mg Tabs (Lorazepam) .... As Needed 1 Tab 8)  Levoxyl 50 Mcg Tabs (Levothyroxine Sodium) .[name] Kitchen.. 1 Tab Once Daily 9)  Potassimin 75 Mg Tabs (Potassium) .[name] Kitchen.. 1 Tab Once Daily 10)  Tiazac 300 Mg Xr24h-Cap (Diltiazem Hcl Er Beads) .[name] Kitchen.. 1 Tab Once Daily 11)  Astepro 137 Mcg/spray Soln (Azelastine Hcl) .... 2 Sprays As Needed 12)  Advair Diskus 250-50 Mcg/dose Misc (Fluticasone-Salmeterol) .... Two Times A Day As Needed  Allergies (verified): 1)  ! Pcn  Past History:  Past Medical History:    Bronchiectasis    Hypothyroidism     Dyslipidemia    Eczema    Iron deficiency anemia    Recurrent otitis/sinusitis    Glaucoma/cataracts  Past Surgical History:    Cataract surgery  Social History:    Married     Tobacco Use - No.     Alcohol Use - no    2 children 4 grandchildren    Recently moved to [facility]    Smoking Status:  never  Review of Systems       The patient complains of prolonged cough.         Mild arthritis.  Otherwise negative across multiple organ systems were as noted in the past medical history  Vital Signs:  Patient profile:   77 year old female Height:      68 inches Weight:      197 pounds BMI:     30.06 Pulse rate:   77 / minute Pulse rhythm:   irregular BP sitting:   152 / 74  (left arm) Cuff size:   regular  Vitals Entered By: Judithe Modest CMA (October 22, 2008 10:29 AM)  Physical Exam  General:  Well developed, well nourished, in no acute distress. Head:  normocephalic and atraumatic Eyes:  PERRLA/EOM intact; conjunctiva and lids normal. Ears:   Mouth:  Teeth, gums and palate normal. Oral mucosa normal. Neck:  Neck supple, no JVD. No masses, thyromegaly or abnormal cervical nodes. Lungs:  Clear bilaterally to auscultation and percussion. Heart:  Non-displaced PMI, chest non-tender;irregular rate and rhythm, S1, S2 without murmurs, rubs or gallops. Carotid upstroke normal, no bruit. Normal abdominal  aortic size, no bruits. Femorals normal pulses, no bruits. Pedals normal pulses. No edema, no varicosities. Abdomen:  Bowel sounds positive; abdomen soft and non-tender without masses, organomegaly, or hernias noted. No hepatosplenomegaly. Msk:  Back normal, normal gait. Muscle strength and tone normal. Extremities:  No clubbing or cyanosis. Neurologic:  Alert and oriented x 3. Skin:  Intact without lesions or rashes. Psych:  Normal affect.  average heart rate 77 No pauses greater than 2 seconds Stephanie Cortez [facility] Impression & Recommendations:   Problem # 1:  ATRIAL FIBRILLATION (ICD-427.31) Her atrial fibrillation is chronic. She has very few symptoms associated with it. She discussed the role of atrial fibrillation ablation. In the absence of data her regarding hard outcomes been benefited by ablation, and he has a symptoms driving ablation, I think it is not appropriate at this time to pursue that. She was in agreement. Her updated medication list for this problem includes:    Coumadin 2 Mg Tabs (Warfarin sodium) .[name] Kitchen... Take as directed  Problem # 2:  HYPERTENSION, BENIGN (ICD-401.1) Stable.  Problem # 3:  BRONCHIECTASIS (ICD-494.0)  Problem # 4:  ANEMIA, IRON DEFICIENCY (ICD-280.9) I have suggested that it be important understand why that she has iron deficiency anemia. There may be some provocation of this by her Coumadin but the underlying pathology needs to be explored. She will pursue this with her primary care physician as is established.  Patient Instructions: 1)  The patient will be seen in 4 months time. I called over to medical today to try to help facilitate establishing her primary care physician.

## 2010-08-01 NOTE — Medication Information (Signed)
Summary: rov/td  Anticoagulant Therapy  Managed by: Weston Brass, PharmD PCP: Dr. Buren Kos PMD, Dr. Sherryl Manges EP/Cards,  Supervising MD: Eden Emms MD, Theron Arista Indication 1: Atrial Fibrillation (ICD-427.31) Lab Used: [facility] Site: [facility] INR POC 2.3 INR RANGE 2 - 3  Dietary changes: no    Health status changes: no    Bleeding/hemorrhagic complications: no    Recent/future hospitalizations: no    Any changes in medication regimen? no    Recent/future dental: no  Any missed doses?: no       Is patient compliant with meds? yes       Current Medications (verified): 1)  Prilosec 20 Mg Cpdr (Omeprazole) .[name] Kitchen.. 1 Tab Once Daily 2)  Coumadin 2 Mg Tabs (Warfarin Sodium) .... Take As Directed By Coumadin Clinic. 3)  Meclizine Hcl 25 Mg Chew (Meclizine Hcl) .... Three Times A Day As Needed 4)  Pravachol 20 Mg Tabs (Pravastatin Sodium) .[name] Kitchen.. 1 Tab At Bedtime 5)  Lorazepam 0.5 Mg Tabs (Lorazepam) .... Three Times A Day As Needed 6)  Levoxyl 50 Mcg Tabs (Levothyroxine Sodium) .[name] Kitchen.. 1 Tab Once Daily 7)  Potassimin 75 Mg Tabs (Potassium) .[name] Kitchen.. 1 Tab Once Daily 8)  Tiazac 300 Mg Xr24h-Cap (Diltiazem Hcl Er Beads) .[name] Kitchen.. 1 Tab Once Daily 9)  Astepro 137 Mcg/spray Soln (Azelastine Hcl) .... 2 Sprays As Needed 10)  Advair Diskus 250-50 Mcg/dose Misc (Fluticasone-Salmeterol) .... Two Times A Day As Needed 11)  Benicar 40 Mg Tabs (Olmesartan Medoxomil) .... Take 1 Tablet By Mouth Once A Day  Allergies (verified): 1)  ! Pcn 2)  ! * Avelox 3)  ! * Protonix  Anticoagulation Management History:       The patient is taking warfarin and comes in today for a routine follow up visit.  Positive risk factors for bleeding include an age of 39 years or older.  The bleeding index is 'intermediate risk'.  Positive CHADS2 values include History of HTN.  Negative CHADS2 values include Age > 49 years old.  The start date was 10/07/2008.  Anticoagulation responsible provider: Eden Emms MD, Theron Arista.  INR POC: 2.3.  Cuvette Lot#: [id number].  Exp: 05/2010.    Anticoagulation Management Assessment/Plan:      The patient's current anticoagulation dose is Coumadin 2 mg tabs: Take as directed by coumadin clinic..  The target INR is 2 - 3.  The next INR is due 05/02/2009.  Anticoagulation instructions were given to patient.  Results were reviewed/authorized by Weston Brass, PharmD.  She was notified by Fanny Skates, PharmD Candidate.         Prior Anticoagulation Instructions: INR 1.5  When OK with physician, take 1.5 tablets for 2 days, then resume same dose of 2 tablets on Monday and 1 tablets all the other days of the week.  Current Anticoagulation Instructions: INR 2.3  Continue normal dosing schedule of 2 tablets on Mondays and 1 tablet on all other days.  Return to clinic in 2 weeks.

## 2010-08-01 NOTE — Assessment & Plan Note (Signed)
Summary: f/u per pt ///kp   Visit Type:  Follow-up Copy to:  Dr. Silvestre Mesi Primary Provider/Referring Provider:  Dr. Buren Kos PMD, Dr. Sherryl Manges EP/Cards,   CC:  Pt here for follow-up. Pt states breathing is doing well. Pt was in hospital due to numbness on right side of face. Stroke ruled out.[name] Kitchen  History of Present Illness: cc: followup bronchiectasis  OV 02/14/2009: Followup bronchiectasis. No problems since last visit. Was admitted 7/21- 7/24 with TIA like symptoms that she thinks was related to heat and dehydration. Was found to be in A Fib RVR which was felt the main reason for admit. She agins feels it ws due to heat and dehydration. Was also noticed to have idioparthic elevation of CKs in the 200s (she is on pravachol and continues to be on it). From bronchiectasis standpoing she is asymptomatic. Her dyspnea is at baseline. Brought on by exertion of 1 flight of stairs, working too hard at home. Relieved by rest. No associated symptoms. Her main issue is that she want some sort of comprehensive help to lose weight.   Current Medications (verified): 1)  Prilosec 20 Mg Cpdr (Omeprazole) .[name] Kitchen.. 1 Tab Once Daily 2)  Coumadin 2 Mg Tabs (Warfarin Sodium) .... Take As Directed By Coumadin Clinic. 3)  Meclizine Hcl 25 Mg Chew (Meclizine Hcl) .... Three Times A Day As Needed 4)  Pravachol 20 Mg Tabs (Pravastatin Sodium) .[name] Kitchen.. 1 Tab At Bedtime 5)  Lorazepam 0.5 Mg Tabs (Lorazepam) .... Three Times A Day As Needed 6)  Levoxyl 50 Mcg Tabs (Levothyroxine Sodium) .[name] Kitchen.. 1 Tab Once Daily 7)  Potassimin 75 Mg Tabs (Potassium) .[name] Kitchen.. 1 Tab Once Daily 8)  Tiazac 300 Mg Xr24h-Cap (Diltiazem Hcl Er Beads) .[name] Kitchen.. 1 Tab Once Daily 9)  Astepro 137 Mcg/spray Soln (Azelastine Hcl) .... 2 Sprays As Needed 10)  Advair Diskus 250-50 Mcg/dose Misc (Fluticasone-Salmeterol) .... Two Times A Day As Needed 11)  Benicar 40 Mg Tabs (Olmesartan Medoxomil) .... Take 1 Tablet By Mouth Once A Day  Allergies (verified):  1)  ! Pcn 2)  ! * Avelox 3)  ! * Protonix  Past History:  Family History: Last updated: 11/01/2008 Father-[employer] Lung Mother- CVA, hypertension 4 siblings- atrial fib Brother- Lung Ca  Social History: Last updated: 11/02/2008 Married  Tobacco Use - No.  Alcohol Use - no 2 children 4 grandchildren Recently moved to [place] from [place] Retired Runner, broadcasting/film/video Grew up in [place]. Moved to [place]. Moved to [place] Feb 2010 due to duaghther living in [place]. Son lives in [place], [place]. Husband works as Production designer, theatre/television/film in [place].  Risk Factors: Smoking Status: never (10/22/2008)  Past Medical History: Bronchiectasis >PFT 07/13/2008  Fev 1.9L/76%, FVC 2.45L/74, Ratio 79, TLC 121%, DLCO 64% Hypothyroidism Dyslipidemia Eczema Iron deficiency anemia -> Hgb 9.7gm% on 07/13/2008 in [place] -> Hgg 129gm% wiht normal irone levsl and ferritin 10/27/2008 in [place] Recurrent otitis/sinusitis Glaucoma/cataracts Atrial Fib s/p pneumovax - 2007 Normal renal function. Creat 0.7mg % 10/27/2008 #Elevated CK  >July 2010 - ? cause #Admission July 2010 for A Fib RVR  Past Surgical History: Reviewed history from 11/02/2008 and no changes required. Cataract surgery TAH Herniated Disc-2009 s/p tube in middle ear  Family History: Reviewed history from 11/01/2008 and no changes required. Father-[employer] Lung Mother- CVA, hypertension 4 siblings- atrial fib Brother- Lung Ca  Social History: Reviewed history from 11/02/2008 and no changes required. Married  Tobacco Use - No.  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 Moved to [place]. Moved to [place] Feb 2010 due to duaghther living in [place]. Son lives in [place], [place]. Husband works as Production designer, theatre/television/film in [place].  Review of Systems        The patient complains of shortness of breath with activity.  The patient denies shortness of breath at rest, productive cough, non-productive cough, coughing up blood, chest pain, irregular heartbeats, acid heartburn, indigestion, loss of appetite, weight change, abdominal pain, difficulty swallowing, sore throat, tooth/dental problems, headaches, nasal congestion/difficulty breathing through nose, sneezing, itching, ear ache, anxiety, depression, hand/feet swelling, joint stiffness or pain, rash, change in color of mucus, and fever.    Vital Signs:  Patient profile:   77 year old female Height:      68 inches Weight:      195.13 pounds O2 Sat:      97 % on Room air Temp:     98.0 degrees F oral Pulse rate:   67 / minute BP sitting:   128 / 70  (right arm) Cuff size:   regular  Vitals Entered By: Carron Curie CMA (February 14, 2009 3:14 PM)  O2 Flow:  Room air CC: Pt here for follow-up. Pt states breathing is doing well. Pt was in hospital due to numbness on right side of face. Stroke ruled out. Comments Medications reviewed with patient Carron Curie CMA  February 14, 2009 3:16 PM    Physical Exam  General:  well developed, well nourished, in no acute distress. Overweight Head:  normocephalic and atraumatic Eyes:  PERRLA/EOM intact; conjunctiva and sclera clear Ears:  TMs intact and clear with normal canals Nose:  no deformity, discharge, inflammation, or lesions Mouth:  no deformity or lesions Neck:  no masses, thyromegaly, or abnormal cervical nodes Chest Wall:  no deformities noted Lungs:  clear bilaterally to auscultation and percussion Heart:  regular rate and rhythm, S1, S2 without murmurs, rubs, gallops, or clicks Abdomen:  bowel sounds positive; abdomen soft and non-tender without masses, or organomegaly Msk:  no deformity or scoliosis noted with normal posture Pulses:  pulses normal Extremities:  no clubbing, cyanosis, edema, or deformity noted  Neurologic:  CN II-XII grossly intact with normal reflexes, coordination, muscle strength and tone Skin:  intact without lesions or rashes Cervical Nodes:  no significant adenopathy Axillary Nodes:  no significant adenopathy Psych:  alert and cooperative; normal mood and affect; normal attention span and concentration   Impression & Recommendations:  Problem # 1:  SHORTNESS OF BREATH (ICD-786.05) Assessment Unchanged Stable dyspnea due to bronchiectasis and being overweight  plan pulmonary rehab with nutrtion advise during rehab Orders: Rehabilitation Referral (Rehab) Est. Patient Level III (14782)  Problem # 2:  RHABDOMYOLYSIS (ICD-728.88) Assessment: Unchanged Has abnormal CKs in the 200s in [facility] 2010. ? due to heat related dehydration vs pravachol related  plan recheck ck Orders: TLB-CK Total Only(Creatine Kinase/CPK) (82550-CK) TLB-CK-MB (Creatine Kinase MB) (82553-CKMB) Est. Patient Level III (95621)  Problem # 3:  BRONCHIECTASIS WITHOUT ACUTE EXACERBATION (ICD-494.0) Assessment: Unchanged plan continue inhalers  Patient Instructions: 1)  have  blood test for cpk - this was elevated in the hospital 2)  will refer you to pulmonary rehab 3)  return to see me in 5 months or sooner if there are problems 4)  flu shot in fall please

## 2010-08-01 NOTE — Medication Information (Signed)
Summary: rov/tm  Anticoagulant Therapy  Managed by: Shelby Dubin PharmD BCPS CPP PCP: Dr. Buren Kos PMD, Dr. Sherryl Manges EP/Cards,  Supervising MD: Jens Som MD, Arlys John Indication 1: Atrial Fibrillation (ICD-427.31) Lab Used: [facility] Site: [facility] PT 21.0 INR POC 3.0 INR RANGE 2 - 3  Dietary changes: no    Health status changes: no    Bleeding/hemorrhagic complications: no    Recent/future hospitalizations: yes       Details: Pt was in hospital last week.  7/28-7/31 R/O stroke and MI  Any changes in medication regimen? no    Recent/future dental: no  Any missed doses?: no       Is patient compliant with meds? yes       Current Medications (verified): 1)  Prilosec 20 Mg Cpdr (Omeprazole) .[name] Kitchen.. 1 Tab Once Daily 2)  Hydralazine Hcl 50 Mg Tabs (Hydralazine Hcl) .[name] Kitchen.. 1 Tablet Three Times A Day 3)  Coumadin 2 Mg Tabs (Warfarin Sodium) .... Take As Directed By Coumadin Clinic. 4)  Meclizine Hcl 25 Mg Chew (Meclizine Hcl) .... Three Times A Day As Needed 5)  Pravachol 20 Mg Tabs (Pravastatin Sodium) .[name] Kitchen.. 1 Tab At Bedtime 6)  Lorazepam 0.5 Mg Tabs (Lorazepam) .... Three Times A Day As Needed 7)  Levoxyl 50 Mcg Tabs (Levothyroxine Sodium) .[name] Kitchen.. 1 Tab Once Daily 8)  Potassimin 75 Mg Tabs (Potassium) .[name] Kitchen.. 1 Tab Once Daily 9)  Tiazac 300 Mg Xr24h-Cap (Diltiazem Hcl Er Beads) .[name] Kitchen.. 1 Tab Once Daily 10)  Astepro 137 Mcg/spray Soln (Azelastine Hcl) .... 2 Sprays As Needed 11)  Advair Diskus 250-50 Mcg/dose Misc (Fluticasone-Salmeterol) .... Two Times A Day As Needed 12)  Benicar 40 Mg Tabs (Olmesartan Medoxomil) .... Take 1 Tablet By Mouth Once A Day 13)  Bactrim Ds 800-160 Mg Tabs (Sulfamethoxazole-Trimethoprim) .... Two Times A Day For 7 Days  Allergies (verified): 1)  ! Pcn 2)  ! * Avelox 3)  ! * Protonix  Anticoagulation Management History:       The patient is taking warfarin and comes in today for a routine follow up visit.  Positive risk factors for bleeding include an age of 39 years or older.  The bleeding index is 'intermediate risk'.  Positive CHADS2 values include History of HTN.  Negative CHADS2 values include Age > 12 years old.  The start date was 10/07/2008.  Prothrombin time is 21.0.  Anticoagulation responsible provider: Jens Som MD, Arlys John.  INR POC: 3.0.  Cuvette Lot#: [id number].  Exp: 12/2009.    Anticoagulation Management Assessment/Plan:      The patient's current anticoagulation dose is Coumadin 2 mg tabs: Take as directed by coumadin clinic..  The target INR is 2 - 3.  The next INR is due 03/01/2009.  Anticoagulation instructions were given to patient.  Results were reviewed/authorized by Shelby Dubin PharmD BCPS CPP.  She was notified by Cloyde Reams RN.         Prior Anticoagulation Instructions: INR 2.0 today  Take 2 mg every day except for Mondays take 4 mg.   Current Anticoagulation Instructions: INR 3.0  Take 1/2 tablet today then resume 1 tablet daily except 2 tablets on Mondays.  Recheck in 4 weeks.

## 2010-08-01 NOTE — Progress Notes (Signed)
Summary: echo results  Phone Note Outgoing Call Call back at [facility] Phone [phone]   Call placed by: Gypsy Balsam RN BSN,  Nov 12, 2008 9:19 AM Call placed to: Patient Summary of Call: [facility] to call regarding echo results Initial call taken by: Gypsy Balsam RN BSN,  Nov 12, 2008 9:19 AM  Follow-up for Phone Call        This is patient's wrong contact information.  No other number in system.  Will mail letter Follow-up by: Gypsy Balsam RN BSN,  Nov 17, 2008 8:59 AM      Appended Document: echo results Called patient's daughter and got correct contact information.  Patient notified of results.

## 2010-08-01 NOTE — Letter (Signed)
Summary: [facility]/[place] [facility]  [facility]/[place] [facility]   Imported By: Sherian Rein 11/25/2008 15:02:40  _____________________________________________________________________  External Attachment:    Type:   Image     Comment:   External Document

## 2010-08-01 NOTE — Medication Information (Signed)
Summary: Stephanie Cortez  Anticoagulant Therapy  Managed by: Weston Brass, PharmD PCP: Dr. Buren Kos PMD, Dr. Sherryl Manges EP/Cards,  Supervising MD: Tenny Craw MD, Gunnar Fusi Indication 1: Atrial Fibrillation (ICD-427.31) Lab Used: [facility] Site: [facility] INR POC 1.5 INR RANGE 2 - 3  Dietary changes: no    Health status changes: no    Bleeding/hemorrhagic complications: no    Recent/future hospitalizations: no    Any changes in medication regimen? no    Recent/future dental: yes     Details: Dental implants done today (10/8)  Any missed doses?: yes     Details: Held coumadin since Monday (10/4) for dental procedure  Is patient compliant with meds? yes       Allergies: 1)  ! Pcn 2)  ! * Avelox 3)  ! * Protonix  Anticoagulation Management History:      The patient is taking warfarin and comes in today for a routine follow up visit.  Positive risk factors for bleeding include an age of 75 years or older.  The bleeding index is 'intermediate risk'.  Positive CHADS2 values include History of HTN.  Negative CHADS2 values include Age > 49 years old.  The start date was 10/07/2008.  Anticoagulation responsible provider: Tenny Craw MD, Gunnar Fusi.  INR POC: 1.5.  Cuvette Lot#: [id number].  Exp: 05/2010.    Anticoagulation Management Assessment/Plan:      The patient's current anticoagulation dose is Coumadin 2 mg tabs: Take as directed by coumadin clinic..  The target INR is 2 - 3.  The next INR is due 04/18/2009.  Anticoagulation instructions were given to patient.  Results were reviewed/authorized by Weston Brass, PharmD.  She was notified by Marcheta Grammes, PharmD candidate.         Prior Anticoagulation Instructions: INR 2.5  The patient is to continue with the same dose of coumadin.  This dosage includes:   Current Anticoagulation Instructions: INR 1.5  When OK with physician, take 1.5 tablets for 2 days, then resume same dose of 2 tablets on Monday and 1 tablets all the other days of the week.

## 2010-08-01 NOTE — Assessment & Plan Note (Signed)
Summary: Acute NP office visit - bronchiectasis   Copy to:  Dr. Silvestre Mesi Primary Provider/Referring Provider:  Dr Marlan Palau  CC:  HA, some wheezing and DOE, prod cough with clear mucus, sinus pressure/congestion, and tightness in chest x3days - denies f/c/s.  History of Present Illness: 77 yowf never smoker with dx of bronchiectasis and dysnea due to bronchiectasis/overweight.  OV  04/04/2010: Followup Bronchiectasis. Last seen August 2010, Dec 2010, Jan 2011 and FEb 2011. Last visit was for AE bronchiectasis. Since then doing well. Finishd 3 months rehab. COmpletely asymptomatic now. Denies cough or dyspnea. . No other specific complaints.  Denies chest pain, dyspnea, orthopnea, hemoptysis, fever, n/v/d, edema, headache.  Uses symbicort as needed which is around 2-3 times per week.  Other things: switched PMD to Dr. Lenord Fellers. had flu shot. Dr Graciela Husbands considering pradaxa.   May 30, 2010 --Presents for an acute office visit. Complains of HA, some wheezing and DOE, prod cough with clear mucus, sinus pressure/congestion, tightness in chest x3days. Used netti pot with some help. But cough is getting worse. Denies chest pain, dyspnea, orthopnea, hemoptysis, fever, n/v/d, edema, headache.   Medications Prior to Update: 1)  Prilosec 20 Mg Cpdr (Omeprazole) .... Take  One 30-60 Min Before First Meal of The Day 2)  Meclizine Hcl 25 Mg Chew (Meclizine Hcl) .... Three Times A Day As Needed 3)  Pravachol 20 Mg Tabs (Pravastatin Sodium) .[name] Kitchen.. 1 Tab At Bedtime 4)  Lorazepam 0.5 Mg Tabs (Lorazepam) .... Three Times A Day As Needed 5)  Levoxyl 50 Mcg Tabs (Levothyroxine Sodium) .[name] Kitchen.. 1 Tab Once Daily 6)  Potassimin 75 Mg Tabs (Potassium) .[name] Kitchen.. 1 Tab Once Daily 7)  Tiazac 300 Mg Xr24h-Cap (Diltiazem Hcl Er Beads) .[name] Kitchen.. 1 Tab Once Daily 8)  Astepro 137 Mcg/spray Soln (Azelastine Hcl) .... 2 Sprays As Needed 9)  Benicar 40 Mg Tabs (Olmesartan Medoxomil) .... Take 1 Tablet By Mouth Once A Day 10)  Symbicort  80-4.5 Mcg/act Aero (Budesonide-Formoterol Fumarate) .... 2 Puffs Daily As Needed 11)  Pradaxa 150 Mg Caps (Dabigatran Etexilate Mesylate) .... Take 1 Capsule By Mouth Two Times A Day 12)  Pradaxa 150 Mg Caps (Dabigatran Etexilate Mesylate) .... Two Times A Day  Current Medications (verified): 1)  Prilosec 20 Mg Cpdr (Omeprazole) .... Take  One 30-60 Min Before First Meal of The Day 2)  Meclizine Hcl 25 Mg Chew (Meclizine Hcl) .... Three Times A Day As Needed 3)  Pravachol 20 Mg Tabs (Pravastatin Sodium) .[name] Kitchen.. 1 Tab At Bedtime 4)  Lorazepam 0.5 Mg Tabs (Lorazepam) .... Three Times A Day As Needed 5)  Synthroid 50 Mcg Tabs (Levothyroxine Sodium) .... Take 1 Tablet By Mouth Once A Day 6)  Potassimin 75 Mg Tabs (Potassium) .[name] Kitchen.. 1 Tab Once Daily 7)  Tiazac 300 Mg Xr24h-Cap (Diltiazem Hcl Er Beads) .[name] Kitchen.. 1 Tab Once Daily 8)  Astepro 137 Mcg/spray Soln (Azelastine Hcl) .... 2 Sprays As Needed 9)  Benicar 40 Mg Tabs (Olmesartan Medoxomil) .... Take 1 Tablet By Mouth Once A Day 10)  Symbicort 80-4.5 Mcg/act Aero (Budesonide-Formoterol Fumarate) .... 2 Puffs Daily As Needed 11)  Pradaxa 150 Mg Caps (Dabigatran Etexilate Mesylate) .... Two Times A Day  Allergies (verified): 1)  ! Pcn 2)  ! * Avelox 3)  ! * Protonix  Past History:  Past Medical History: Last updated: 04/04/2010 Bronchiectasis      - PFT 07/13/2008  Fev 1.9L/76%, FVC 2.45L/74, Ratio 79, TLC 121%, DLCO 64% AE BRonchiectasis  - Dec  2010.New Rx:  outpatient  - Feb 2011 - Rx outpatient Hypothyroidism Dyslipidemia Eczema Iron deficiency anemia - Hgb 9.7gm% on 07/13/2008 in [place] -  Hgg 129gm% wiht normal irone levsl and ferritin 10/27/2008 in [place] Recurrent otitis/sinusitis Glaucoma/cataracts Atrial Fib s/p pneumovax - 2007 Normal renal function. Creat 0.7mg % 10/27/2008 #Elevated CK   - July 2010 - ? cause #Admission July 2010 for A Fib RVR  Past Surgical History: Last updated: 11/02/2008 Cataract surgery TAH Herniated  Disc-2009 s/p tube in middle ear  Family History: Last updated: 04/04/2010 Father-[employer] Lung Mother- CVA, hypertension 4 siblings- atrial fib Brother- Lung Ca and emphysema  Social History: Last updated: 07/29/2009 Married  Tobacco Use - No.  Alcohol Use - no 2 children 4 grandchildren  moved to [employer] from [place] 2009 Retired Runner, broadcasting/film/video Grew up in [place]. Moved to [place]. Moved to [place] Feb 2010 due to duaghther living in [place]. Son lives in [place], [place]. Husband works as Production designer, theatre/television/film in [place].  Risk Factors: Smoking Status: never (04/04/2010)  Review of Systems      See HPI  Vital Signs:  Patient profile:   77 year old female Height:      68 inches Weight:      196.25 pounds BMI:     29.95 O2 Sat:      98 % on Room air Temp:     96.9 degrees F oral Pulse rate:   70 / minute BP sitting:   128 / 76  (left arm) Cuff size:   regular  Vitals Entered By: Boone Master CNA/MA (May 30, 2010 4:15 PM)  O2 Flow:  Room air CC: HA, some wheezing and DOE, prod cough with clear mucus, sinus pressure/congestion, tightness in chest x3days - denies f/c/s Is Patient Diabetic? No Comments Medications reviewed with patient Daytime contact number verified with patient. Boone Master CNA/MA  May 30, 2010 4:15 PM    Physical Exam  Additional Exam:  wt 191 > 202 June 28, 2009>>201 August 18, 2009 >>196 May 30, 2010  HEENT mild turbinate edema, mucoid secretions only sltly increased.  Oropharynx no thrush or excess pnd or cobblestoning.  No JVD or cervical adenopathy. Mild accessory muscle hypertrophy. Trachea midline, nl thryroid. Chest was hyperinflated by percussion with diminished breath sounds and moderate increased exp time without wheeze. Hoover sign positive at mid inspiration. Regular rate and rhythm without murmur gallop or rub or increase P2 or edema.  Abd: no hsm, nl excursion. Ext warm without cyanosis or clubbing.       Impression & Recommendations:  Problem # 1:  BRONCHIECTASIS (ICD-494.0)  Mild flare w/ URI  Plan :  xopenex  neb  Omnicef 300mg  two times a day for 7days  Mucinex DM two times a day as needed cough/congestion  Increase fluids and rest.  Please contact office for sooner follow up if symptoms do not improve or worsen  follow up Dr. Marchelle Gearing as scheduled and as needed   Orders: Est. Patient Level IV (69629)  Medications Added to Medication List This Visit: 1)  Synthroid 50 Mcg Tabs (Levothyroxine sodium) .... Take 1 tablet by mouth once a day 2)  Cefdinir 300 Mg Caps (Cefdinir) .[name] Kitchen.. 1 by mouth two times a day  Complete Medication List: 1)  Prilosec 20 Mg Cpdr (Omeprazole) .... Take  one 30-60 min before first meal of the day 2)  Meclizine Hcl 25 Mg Chew (Meclizine hcl) .... Three times a day as needed 3)  Pravachol  20 Mg Tabs (Pravastatin sodium) .[name] Kitchen.. 1 tab at bedtime 4)  Lorazepam 0.5 Mg Tabs (Lorazepam) .... Three times a day as needed 5)  Synthroid 50 Mcg Tabs (Levothyroxine sodium) .... Take 1 tablet by mouth once a day 6)  Potassimin 75 Mg Tabs (Potassium) .[name] Kitchen.. 1 tab once daily 7)  Tiazac 300 Mg Xr24h-cap (Diltiazem hcl er beads) .[name] Kitchen.. 1 tab once daily 8)  Astepro 137 Mcg/spray Soln (Azelastine hcl) .... 2 sprays as needed 9)  Benicar 40 Mg Tabs (Olmesartan medoxomil) .... Take 1 tablet by mouth once a day 10)  Symbicort 80-4.5 Mcg/act Aero (Budesonide-formoterol fumarate) .... 2 puffs daily as needed 11)  Pradaxa 150 Mg Caps (Dabigatran etexilate mesylate) .... Two times a day 12)  Cefdinir 300 Mg Caps (Cefdinir) .[name] Kitchen.. 1 by mouth two times a day  Patient Instructions: 1)  Omnicef 300mg  two times a day for 7days  2)  Mucinex DM two times a day as needed cough/congestion  3)  Increase fluids and rest.  4)  Please contact office for sooner follow up if symptoms do not improve or worsen  5)  follow up Dr. Marchelle Gearing as scheduled and as needed  Prescriptions: CEFDINIR 300  MG CAPS (CEFDINIR) 1 by mouth two times a day  #14 x 0   Entered and Authorized by:   Rubye Oaks NP   Signed by:   Rubye Oaks NP on 05/30/2010   Method used:   Electronically to        [facility] # [phone]* (retail)       [address]       [place], [place]  [address]       Ph: [phone]       Fax: [phone]   RxID:   [id number]     Appended Document: Orders Update - neb treatment documentation    Clinical Lists Changes  Orders: Added new Service order of Nebulizer Tx (44010) - Signed

## 2010-08-01 NOTE — Assessment & Plan Note (Signed)
Summary: Pulmonary/ acute ov with DPI teaching   Copy to:  Dr. Silvestre Mesi Primary Provider/Referring Provider:  Dr. Buren Kos PMD, Dr. Sherryl Manges EP/Cards,   CC:  Acute visit.  Pt c/o increased SOB and fatigue x 3 days.  She denies cough but states that she has some drainage in throat.  [name] Kitchen  History of Present Illness: 16 yowf never smoker with dx of bronchiectasis.  OV 02/14/2009: Followup bronchiectasis  asymptomatic. Her dyspnea is at baseline. Brought on by exertion of 1 flight of stairs, working too hard at home. Relieved by rest. No associated symptoms. Her main issue is that she want some sort of comprehensive help to lose weight.   June 28, 2009 Acute visit.  Pt c/o increased SOB and fatigue x 3 days.  She denies cough but states that she has some drainage in throat.  Started advair and helped night time wheezing but not sense of congestion.  Pt denies any significant sore throat, dysphagia, itching, sneezing, purulent sputum, fever, chills, sweats, unintended wt loss, pleuritic or exertional cp, hempoptysis, change in activity tolerance  orthopnea pnd or leg swelling.  Current Medications (verified): 1)  Prilosec 20 Mg Cpdr (Omeprazole) .[name] Kitchen.. 1 Tab Once Daily 2)  Coumadin 2 Mg Tabs (Warfarin Sodium) .... Take As Directed By Coumadin Clinic. 3)  Meclizine Hcl 25 Mg Chew (Meclizine Hcl) .... Three Times A Day As Needed 4)  Pravachol 20 Mg Tabs (Pravastatin Sodium) .[name] Kitchen.. 1 Tab At Bedtime 5)  Lorazepam 0.5 Mg Tabs (Lorazepam) .... Three Times A Day As Needed 6)  Levoxyl 50 Mcg Tabs (Levothyroxine Sodium) .[name] Kitchen.. 1 Tab Once Daily 7)  Potassimin 75 Mg Tabs (Potassium) .[name] Kitchen.. 1 Tab Once Daily 8)  Tiazac 300 Mg Xr24h-Cap (Diltiazem Hcl Er Beads) .[name] Kitchen.. 1 Tab Once Daily 9)  Astepro 137 Mcg/spray Soln (Azelastine Hcl) .... 2 Sprays As Needed 10)  Advair Diskus 250-50 Mcg/dose Misc (Fluticasone-Salmeterol) .... Two Times A Day As Needed  11)  Benicar 40 Mg Tabs (Olmesartan Medoxomil) .... Take 1 Tablet By Mouth Once A Day  Allergies (verified): 1)  ! Pcn 2)  ! * Avelox 3)  ! * Protonix  Past History:  Past Medical History: Bronchiectasis      - PFT 07/13/2008  Fev 1.9L/76%, FVC 2.45L/74, Ratio 79, TLC 121%, DLCO 64% Hypothyroidism Dyslipidemia Eczema Iron deficiency anemia -> Hgb 9.7gm% on 07/13/2008 in [place] -> Hgg 129gm% wiht normal irone levsl and ferritin 10/27/2008 in [place] Recurrent otitis/sinusitis Glaucoma/cataracts Atrial Fib s/p pneumovax - 2007 Normal renal function. Creat 0.7mg % 10/27/2008 #Elevated CK  >July 2010 - ? cause #Admission July 2010 for A Fib RVR  Vital Signs:  Patient profile:   77 year old female Weight:      202 pounds O2 Sat:      99 % on Room air Temp:     97.8 degrees F oral Pulse rate:   91 / minute BP sitting:   134 / 76  (left arm)  Vitals Entered By: Vernie Murders (June 28, 2009 12:04 PM)  O2 Flow:  Room air  Physical Exam  Additional Exam:  wt 191 > 202 June 28, 2009 HEENT mild turbinate edema, mucoid secretions only sltly increased.  Oropharynx no thrush or excess pnd or cobblestoning.  No JVD or cervical adenopathy. Mild accessory muscle hypertrophy. Trachea midline, nl thryroid. Chest was hyperinflated by percussion with diminished breath sounds and moderate increased exp time without wheeze. Hoover sign positive at mid inspiration. Regular rate and rhythm  without murmur gallop or rub or increase P2 or edema.  Abd: no hsm, nl excursion. Ext warm without cyanosis or clubbing.      Impression & Recommendations:  Problem # 1:  BRONCHIECTASIS (ICD-494.0) Minimal exac already addressed with advair though this may be contributing to some of her sense of throat congestion or she may have poorly controlled rhinitis or reflux as well.  For now, work on optimal DPI technique rinse and garge and use doxy as needed   I spent extra time with the patient today explaining optimal DPI  technique.  This improved from  50-90%  Medications Added to Medication List This Visit: 1)  Prilosec 20 Mg Cpdr (Omeprazole) .... Take  one 30-60 min before first meal of the day 2)  Doxycycline Hyclate 100 Mg Caps (Doxycycline hyclate) .... One twice daily before eating  Other Orders: Est. Patient Level III (16109) HFA Instruction [phone])  Patient Instructions: 1)  Doxycycline 100mg  twice daily before eat x 7 days if develop sore throat, nasty mucus or fever 2)  keep your appt to see Ramaswamy 3)  Work on inhaler technique:  relax and blow all the way out then take a nice smooth deep breath back in Prescriptions: DOXYCYCLINE HYCLATE 100 MG CAPS (DOXYCYCLINE HYCLATE) one twice daily before eating  #14 x 0   Entered and Authorized by:   Nyoka Cowden MD   Signed by:   Nyoka Cowden MD on 06/28/2009   Method used:   Electronically to        [facility] # [phone]* (retail)       [address]       [place], [place]  [address]       Ph: [phone]       Fax: [phone]   RxID:   [id number]

## 2010-08-01 NOTE — Progress Notes (Signed)
  Phone Note Call from Patient   Caller: Patient Details for Reason: New med Summary of Call: Pt called to inform us that she started Levaquin 500mg s daily on 08/18/09 for 10 days. Thus, CVRR appt. s/c for today.   Initial call taken by: Bethena Midget, RN, BSN,  August 22, 2009 12:32 PM

## 2010-08-01 NOTE — Letter (Signed)
Summary: OV/[facility]  OV/[facility]   Imported By: Sherian Rein 11/06/2008 10:08:54  _____________________________________________________________________  External Attachment:    Type:   Image     Comment:   External Document

## 2010-08-01 NOTE — Medication Information (Signed)
Summary: rov/ewj  Anticoagulant Therapy  Managed by: Cloyde Reams, RN, BSN PCP: Dr. Buren Kos PMD, Dr. Sherryl Manges EP/Cards,  Supervising MD: Ladona Ridgel MD, Sharlot Gowda Indication 1: Atrial Fibrillation (ICD-427.31) Lab Used: [facility] Site: [facility] INR POC 1.9 INR RANGE 2 - 3  Dietary changes: no    Health status changes: no    Bleeding/hemorrhagic complications: no    Recent/future hospitalizations: no    Any changes in medication regimen? yes       Details: Completed course of Levaquin since last visit.    Recent/future dental: no  Any missed doses?: no       Is patient compliant with meds? yes       Current Medications (verified): 1)  Prilosec 20 Mg Cpdr (Omeprazole) .... Take  One 30-60 Min Before First Meal of The Day 2)  Coumadin 2 Mg Tabs (Warfarin Sodium) .... Take As Directed By Coumadin Clinic. 3)  Meclizine Hcl 25 Mg Chew (Meclizine Hcl) .... Three Times A Day As Needed 4)  Pravachol 20 Mg Tabs (Pravastatin Sodium) .[name] Kitchen.. 1 Tab At Bedtime 5)  Lorazepam 0.5 Mg Tabs (Lorazepam) .... Three Times A Day As Needed 6)  Levoxyl 50 Mcg Tabs (Levothyroxine Sodium) .[name] Kitchen.. 1 Tab Once Daily 7)  Potassimin 75 Mg Tabs (Potassium) .[name] Kitchen.. 1 Tab Once Daily 8)  Tiazac 300 Mg Xr24h-Cap (Diltiazem Hcl Er Beads) .[name] Kitchen.. 1 Tab Once Daily 9)  Astepro 137 Mcg/spray Soln (Azelastine Hcl) .... 2 Sprays As Needed 10)  Benicar 40 Mg Tabs (Olmesartan Medoxomil) .... Take 1 Tablet By Mouth Once A Day  Allergies (verified): 1)  ! Pcn 2)  ! * Avelox 3)  ! * Protonix  Anticoagulation Management History:      The patient is taking warfarin and comes in today for a routine follow up visit.  Positive risk factors for bleeding include an age of 75 years or older.  The bleeding index is 'intermediate risk'.  Positive CHADS2 values include History of HTN.  Negative CHADS2 values include Age > 75 years old.  The start date was 10/07/2008.  Anticoagulation responsible provider: Ladona Ridgel MD, Sharlot Gowda.  INR POC:  1.9.  Cuvette Lot#: [id number].  Exp: 10/2010.    Anticoagulation Management Assessment/Plan:      The patient's current anticoagulation dose is Coumadin 2 mg tabs: Take as directed by coumadin clinic..  The target INR is 2 - 3.  The next INR is due 09/27/2009.  Anticoagulation instructions were given to patient.  Results were reviewed/authorized by Cloyde Reams, RN, BSN.  She was notified by Cloyde Reams RN.         Prior Anticoagulation Instructions: INR 2.9  Take 2 mg today (1 tablet), 1 tablet on Tuesday and Wednesday, 1/2 tablet on Thursday (1 mg), then resume home dose.  Will end Levaquin on 08/27/09.  Recheck in 3 weeks.  Current Anticoagulation Instructions: INR 1.9  Take 2 tablets today and stay on current dosage 1 tablet daily except 2 tablets on Mondays.  Recheck in 4 weeks.    Prescriptions: COUMADIN 2 MG TABS (WARFARIN SODIUM) Take as directed by coumadin clinic. Brand medically necessary #120 x 1   Entered by:   Cloyde Reams RN   Authorized by:   Nathen May, MD, [facility]   Signed by:   Cloyde Reams RN on 09/05/2009   Method used:   Electronically to        [facility] [address] # [phone]* (retail)       [phone]  [address]       [place], [place]  [address]       Ph: [phone]       Fax: [phone]   RxID:   [id number]

## 2010-08-01 NOTE — Assessment & Plan Note (Signed)
Summary: 6 month return.amber   Visit Type:  Follow-up Referring Provider:  Dr. Silvestre Mesi Primary Provider:  Dr Marlan Palau  CC:  no complaints -- no longer has symptoms from A-Fib.  History of Present Illness: Stephanie Cortez is seen in followup for long-standing history of atrial fibrillation dating back about one decade. She currently is in atrial fibrillation that is permanent. She has failed multiple cardioversions and anti arrhythmic drug trials with flecainide, amiodarone, and sotalol. She has been left in atrial fibrillation and currently she is largely asymptomatic. She denies palpitations. She denies exercise intolerance.  Thromboembolic risk factors include hypertension, gender, and age.  Her cardiac evaluation in the past has included an echo in 2005 notable for mitral regurgitation and tricuspid regurgitation both of which were rated as moderate. She had left atrial enlargement and normal left ventricular function. Multiple Myoview scans have demonstrated normal left ventricular function and no ischemia.   Her inr have been all over the map  Current Medications (verified): 1)  Prilosec 20 Mg Cpdr (Omeprazole) .... Take  One 30-60 Min Before First Meal of The Day 2)  Coumadin 2 Mg Tabs (Warfarin Sodium) .... Take As Directed By Coumadin Clinic. 3)  Meclizine Hcl 25 Mg Chew (Meclizine Hcl) .... Three Times A Day As Needed 4)  Pravachol 20 Mg Tabs (Pravastatin Sodium) .[name] Kitchen.. 1 Tab At Bedtime 5)  Lorazepam 0.5 Mg Tabs (Lorazepam) .... Three Times A Day As Needed 6)  Levoxyl 50 Mcg Tabs (Levothyroxine Sodium) .[name] Kitchen.. 1 Tab Once Daily 7)  Potassimin 75 Mg Tabs (Potassium) .[name] Kitchen.. 1 Tab Once Daily 8)  Tiazac 300 Mg Xr24h-Cap (Diltiazem Hcl Er Beads) .[name] Kitchen.. 1 Tab Once Daily 9)  Astepro 137 Mcg/spray Soln (Azelastine Hcl) .... 2 Sprays As Needed 10)  Benicar 40 Mg Tabs (Olmesartan Medoxomil) .... Take 1 Tablet By Mouth Once A Day 11)  Symbicort 80-4.5 Mcg/act Aero (Budesonide-Formoterol  Fumarate) .... 2 Puffs Daily As Needed  Allergies (verified): 1)  ! Pcn 2)  ! * Avelox 3)  ! * Protonix  Past History:  Past Medical History: Last updated: 06/28/2009 Bronchiectasis      - PFT 07/13/2008  Fev 1.9L/76%, FVC 2.45L/74, Ratio 79, TLC 121%, DLCO 64% Hypothyroidism Dyslipidemia Eczema Iron deficiency anemia -> Hgb 9.7gm% on 07/13/2008 in [place] -> Hgg 129gm% wiht normal irone levsl and ferritin 10/27/2008 in [place] Recurrent otitis/sinusitis Glaucoma/cataracts Atrial Fib s/p pneumovax - 2007 Normal renal function. Creat 0.7mg % 10/27/2008 #Elevated CK  >July 2010 - ? cause #Admission July 2010 for A Fib RVR  Past Surgical History: Last updated: 11/02/2008 Cataract surgery TAH Herniated Disc-2009 s/p tube in middle ear  Family History: Last updated: 11/01/2008 Father-[employer] Lung Mother- CVA, hypertension 4 siblings- atrial fib Brother- Lung Ca  Social History: Last updated: 07/29/2009 Married  Tobacco Use - No.  Alcohol Use - no 2 children 4 grandchildren  moved to [place] from [place] 2009 Retired Runner, broadcasting/film/video Grew up in [place]. Moved to [place]. Moved to [place] Feb 2010 due to duaghther living in [place]. Son lives in [place], [place]. Husband works as Production designer, theatre/television/film in [place].  Risk Factors: Smoking Status: never (10/22/2008)  Vital Signs:  Patient profile:   77 year old female Height:      68 inches Weight:      189 pounds BMI:     28.84 Pulse rate:   79 / minute BP sitting:   132 / 78  (left arm) Cuff size:   regular  Vitals Entered By: Hardin Negus, RMA (March 31, 2010 12:11 PM)  Physical Exam  General:  The patient was alert and oriented in no acute distress. HEENT Normal.  Neck veins were flat, carotids were brisk.  Lungs were clear.  Heart sounds were irregular without murmurs or gallops.  Abdomen was soft with active bowel sounds. There is no clubbing cyanosis or edema. Skin Warm and  dry    Impression & Recommendations:  Problem # 1:  ATRIAL FIBRILLATION (ICD-427.31) the patient remains in permanent atrial fibrillation. Her symptoms are minimal. Exercise tolerance is adequate.  We had a lengthy discussion regarding Pradaxa including benefits and risks compared to Coumadin. Her INRs have been quite variable. She would like to take Pradaxa  Her updated medication list for this problem includes:    Coumadin 2 Mg Tabs (Warfarin sodium) .[name] Kitchen... Take as directed by coumadin clinic.  Orders: EKG w/ Interpretation (93000)  Problem # 2:  HYPERTENSION, BENIGN (ICD-401.1) well controlled Her updated medication list for this problem includes:    Tiazac 300 Mg Xr24h-cap (Diltiazem hcl er beads) .[name] Kitchen... 1 tab once daily    Benicar 40 Mg Tabs (Olmesartan medoxomil) .[name] Kitchen... Take 1 tablet by mouth once a day  Patient Instructions: 1)  Your physician recommends that you schedule a follow-up appointment in:3 months 2)  Your physician has recommended you make the following change in your medication: Start Pradaxa 150 mg twice a day after your blood test next week.  Prescriptions: TIAZAC 300 MG XR24H-CAP (DILTIAZEM HCL ER BEADS) 1 tab once daily  #30 x 3   Entered by:   Claris Gladden RN   Authorized by:   Nathen May, MD, [facility]   Signed by:   Claris Gladden RN on 03/31/2010   Method used:   Electronically to        [facility] # [phone]* (retail)       [address]       [place], [place]  [address]       Ph: [phone]       Fax: [phone]   RxID:   [id number]   Appended Document: 6 month return.amber We will wait to review her renal function prior to initiation   she is scheduled to get blood work with Dr Lenord Fellers next week

## 2010-08-01 NOTE — Medication Information (Signed)
Summary: rov/ewj  Anticoagulant Therapy  Managed by: Bethena Midget, RN, BSN PCP: Dr. Buren Kos PMD, Dr. Sherryl Manges EP/Cards,  Supervising MD: Daleen Squibb MD, Maisie Fus Indication 1: Atrial Fibrillation (ICD-427.31) Lab Used: [facility] Site: [facility] INR RANGE 2 - 3  Dietary changes: no    Health status changes: no    Bleeding/hemorrhagic complications: no    Recent/future hospitalizations: no    Any changes in medication regimen? yes       Details: Zytrec PRN   Recent/future dental: yes     Details: Pending extraction tomorrow  Any missed doses?: no       Is patient compliant with meds? yes       Allergies: 1)  ! Pcn 2)  ! * Avelox 3)  ! * Protonix  Anticoagulation Management History:      The patient is taking warfarin and comes in today for a routine follow up visit.  Positive risk factors for bleeding include an age of 52 years or older.  The bleeding index is 'intermediate risk'.  Positive CHADS2 values include History of HTN.  Negative CHADS2 values include Age > 61 years old.  The start date was 10/07/2008.  Anticoagulation responsible provider: Daleen Squibb MD, Maisie Fus.  Cuvette Lot#: [id number].  Exp: 12/2010.    Anticoagulation Management Assessment/Plan:      The patient's current anticoagulation dose is Coumadin 2 mg tabs: Take as directed by coumadin clinic..  The target INR is 2 - 3.  The next INR is due 11/18/2009.  Anticoagulation instructions were given to patient.  Results were reviewed/authorized by Bethena Midget, RN, BSN.  She was notified by Bethena Midget, RN, BSN.         Prior Anticoagulation Instructions: INR 3.2  Hold today's dosage of coumadin then resume same dosage 1 tablet daily except 2 tablets on Mondays. Called made OV to check INR on 11/01/09 and fax results to Dr Bradly Chris [phone].     Current Anticoagulation Instructions: INR 2.1 Resume 1 pill everyday except 2 pills on Mondays. Recheck in 2-3 weeks.

## 2010-08-01 NOTE — Medication Information (Signed)
Summary: rov/ewj  Anticoagulant Therapy  Managed by: Rolland Porter, PharmD PCP: Dr. Buren Kos PMD, Dr. Sherryl Manges EP/Cards,  Supervising MD: Gala Romney MD, Reuel Boom Indication 1: Atrial Fibrillation (ICD-427.31) Lab Used: [facility] Site: [facility] INR POC 2.9 INR RANGE 2 - 3  Dietary changes: no    Health status changes: no    Bleeding/hemorrhagic complications: no    Recent/future hospitalizations: no    Any changes in medication regimen? yes       Details: Levaquin 500 mg daily, ending 2/26  Recent/future dental: no  Any missed doses?: no       Is patient compliant with meds? yes       Current Medications (verified): 1)  Prilosec 20 Mg Cpdr (Omeprazole) .... Take  One 30-60 Min Before First Meal of The Day 2)  Coumadin 2 Mg Tabs (Warfarin Sodium) .... Take As Directed By Coumadin Clinic. 3)  Meclizine Hcl 25 Mg Chew (Meclizine Hcl) .... Three Times A Day As Needed 4)  Pravachol 20 Mg Tabs (Pravastatin Sodium) .[name] Kitchen.. 1 Tab At Bedtime 5)  Lorazepam 0.5 Mg Tabs (Lorazepam) .... Three Times A Day As Needed 6)  Levoxyl 50 Mcg Tabs (Levothyroxine Sodium) .[name] Kitchen.. 1 Tab Once Daily 7)  Potassimin 75 Mg Tabs (Potassium) .[name] Kitchen.. 1 Tab Once Daily 8)  Tiazac 300 Mg Xr24h-Cap (Diltiazem Hcl Er Beads) .[name] Kitchen.. 1 Tab Once Daily 9)  Astepro 137 Mcg/spray Soln (Azelastine Hcl) .... 2 Sprays As Needed 10)  Symbicort 160-4.5 Mcg/act  Aero (Budesonide-Formoterol Fumarate) .... Two Puffs Twice Daily 11)  Benicar 40 Mg Tabs (Olmesartan Medoxomil) .... Take 1 Tablet By Mouth Once A Day 12)  Levaquin 500 Mg Tabs (Levofloxacin) .[name] Kitchen.. 1 By Mouth Once Daily  Allergies (verified): 1)  ! Pcn 2)  ! * Avelox 3)  ! * Protonix  Anticoagulation Management History:      The patient is taking warfarin and comes in today for a routine follow up visit.  Positive risk factors for bleeding include an age of 75 years or older.  The bleeding index is 'intermediate risk'.  Positive CHADS2 values include History of  HTN.  Negative CHADS2 values include Age > 42 years old.  The start date was 10/07/2008.  Anticoagulation responsible provider: Avir Deruiter MD, Reuel Boom.  INR POC: 2.9.  Cuvette Lot#: [id number].  Exp: 10/2010.    Anticoagulation Management Assessment/Plan:      The patient's current anticoagulation dose is Coumadin 2 mg tabs: Take as directed by coumadin clinic..  The target INR is 2 - 3.  The next INR is due 09/13/2009.  Anticoagulation instructions were given to patient.  Results were reviewed/authorized by Rolland Porter, PharmD.  She was notified by Rolland Porter.         Prior Anticoagulation Instructions: INR 2.9  Continue on same dosage 1 tablet daily except 2 tablets on Mondays.  Recheck in 4 weeks.    Current Anticoagulation Instructions: INR 2.9  Take 2 mg today (1 tablet), 1 tablet on Tuesday and Wednesday, 1/2 tablet on Thursday (1 mg), then resume home dose.  Will end Levaquin on 08/27/09.  Recheck in 3 weeks.

## 2010-08-01 NOTE — Medication Information (Signed)
Summary: rov/amr  Anticoagulant Therapy  Managed by: Weston Brass, PharmD PCP: Dr Marlan Palau Supervising MD: Jens Som MD, Arlys John Indication 1: Atrial Fibrillation (ICD-427.31) Lab Used: [facility] Site: [facility] INR POC 1.9 INR RANGE 2 - 3  Dietary changes: no    Health status changes: no    Bleeding/hemorrhagic complications: no    Recent/future hospitalizations: no    Any changes in medication regimen? no    Recent/future dental: no  Any missed doses?: no       Is patient compliant with meds? yes      Comments: Pt discussed changing to Pradaxa with Dr. Graciela Husbands at last visit.  Per Dr. Odessa Fleming note, needs baseline BMET from Dr. Beryle Quant office.  Received labs from Dr. Lenord Fellers.  SCr- 0.85.  Since pt's INR <2, will start Pradaxa today.  CBC ordered for 10 days.    Current Medications (verified): 1)  Prilosec 20 Mg Cpdr (Omeprazole) .... Take  One 30-60 Min Before First Meal of The Day 2)  Meclizine Hcl 25 Mg Chew (Meclizine Hcl) .... Three Times A Day As Needed 3)  Pravachol 20 Mg Tabs (Pravastatin Sodium) .[name] Kitchen.. 1 Tab At Bedtime 4)  Lorazepam 0.5 Mg Tabs (Lorazepam) .... Three Times A Day As Needed 5)  Levoxyl 50 Mcg Tabs (Levothyroxine Sodium) .[name] Kitchen.. 1 Tab Once Daily 6)  Potassimin 75 Mg Tabs (Potassium) .[name] Kitchen.. 1 Tab Once Daily 7)  Tiazac 300 Mg Xr24h-Cap (Diltiazem Hcl Er Beads) .[name] Kitchen.. 1 Tab Once Daily 8)  Astepro 137 Mcg/spray Soln (Azelastine Hcl) .... 2 Sprays As Needed 9)  Benicar 40 Mg Tabs (Olmesartan Medoxomil) .... Take 1 Tablet By Mouth Once A Day 10)  Symbicort 80-4.5 Mcg/act Aero (Budesonide-Formoterol Fumarate) .... 2 Puffs Daily As Needed 11)  Proair Hfa 108 (90 Base) Mcg/act  Aers (Albuterol Sulfate) .[name] Kitchen.. 1-2 Puffs Every 4-6 Hours As Needed 12)  Pradaxa 150 Mg Caps (Dabigatran Etexilate Mesylate) .... Take 1 Capsule By Mouth Two Times A Day  Allergies: 1)  ! Pcn 2)  ! * Avelox 3)  ! * Protonix  Anticoagulation Management History:      The patient is taking  warfarin and comes in today for a routine follow up visit.  Positive risk factors for bleeding include an age of 24 years or older.  The bleeding index is 'intermediate risk'.  Positive CHADS2 values include History of HTN.  Negative CHADS2 values include Age > 56 years old.  The start date was 10/07/2008.  Today's INR is 1.9.  Anticoagulation responsible provider: Jens Som MD, Arlys John.  INR POC: 1.9.  Exp: 05/2011.    Anticoagulation Management Assessment/Plan:      The target INR is 2 - 3.  The next INR is due 04/03/2010.  Anticoagulation instructions were given to patient.  Results were reviewed/authorized by Weston Brass, PharmD.  She was notified by Weston Brass PharmD.         Prior Anticoagulation Instructions: 03/20/2010 INR 1.7  Increase your dose tonight and tomorrow night to 2 tablets (4mg  each night) then continue your normal schedule of 1 tablet a day (2mg ).  Current Anticoagulation Instructions: INR 1.9  Stop Coumadin.  Change to Pradaxa 150mg  two times a day.  Lab work in 7-10 days.

## 2010-08-01 NOTE — Medication Information (Signed)
Summary: rov/ewj  Anticoagulant Therapy  Managed by: Elaina Pattee, PharmD PCP: Dr. Buren Kos PMD, Dr. Sherryl Manges EP/Cards,  Supervising MD: Johney Frame MD, Fayrene Fearing Indication 1: Atrial Fibrillation (ICD-427.31) Lab Used: [facility] Site: [facility] INR POC 3.0 INR RANGE 2 - 3  Dietary changes: yes       Details: Pt admits she has never eaten a "consistent" diet.  Health status changes: no    Bleeding/hemorrhagic complications: no    Recent/future hospitalizations: no    Any changes in medication regimen? no    Recent/future dental: no  Any missed doses?: no       Is patient compliant with meds? yes       Allergies: 1)  ! Pcn 2)  ! * Avelox 3)  ! * Protonix  Anticoagulation Management History:      The patient is taking warfarin and comes in today for a routine follow up visit.  Positive risk factors for bleeding include an age of 75 years or older.  The bleeding index is 'intermediate risk'.  Positive CHADS2 values include History of HTN.  Negative CHADS2 values include Age > 64 years old.  The start date was 10/07/2008.  Anticoagulation responsible provider: Joram Venson MD, Fayrene Fearing.  INR POC: 3.0.  Cuvette Lot#: [id number].  Exp: 01/2011.    Anticoagulation Management Assessment/Plan:      The patient's current anticoagulation dose is Coumadin 2 mg tabs: Take as directed by coumadin clinic..  The target INR is 2 - 3.  The next INR is due 01/19/2010.  Anticoagulation instructions were given to patient.  Results were reviewed/authorized by Elaina Pattee, PharmD.  She was notified by Elaina Pattee, PharmD.         Prior Anticoagulation Instructions: INR 3.0  Take 1/2 tablet today then resume same dosage 1 tablet daily except 2 tablets on Mondays.  Recheck in 3 weeks.    Current Anticoagulation Instructions: INR 3.0. Take 1/2 tablet today, then take 1 tablet daily except 2 tablets on Mondays. Recheck in 4 weeks.

## 2010-08-01 NOTE — Miscellaneous (Signed)
Summary: Pulm Progress Report/[facility]  Pulm Progress Report/[facility]   Imported By: Sherian Rein 10/31/2009 14:54:45  _____________________________________________________________________  External Attachment:    Type:   Image     Comment:   External Document

## 2010-08-01 NOTE — Progress Notes (Signed)
Summary: Dental procedure  ---- Converted from flag ---- ---- 03/02/2010 4:05 PM, Weston Brass PharmD wrote: f/u ok for pt to stop coumadin.  Dentist- [facility] [phone]) ------------------------------

## 2010-08-01 NOTE — Medication Information (Signed)
Summary: Stephanie Cortez  Anticoagulant Therapy  Managed by: Cloyde Reams, RN, BSN PCP: Dr. Buren Kos PMD, Dr. Sherryl Manges EP/Cards,  Supervising MD: Tenny Craw MD, Gunnar Fusi Indication 1: Atrial Fibrillation (ICD-427.31) Lab Used: [facility]  Site: [facility] INR POC 2.7 INR RANGE 2 - 3  Dietary changes: no    Health status changes: no    Bleeding/hemorrhagic complications: no    Recent/future hospitalizations: no    Any changes in medication regimen? yes       Details: Taking Doxycycline 100mg  bid x 7 days, today is the last day.   Recent/future dental: no  Any missed doses?: no       Is patient compliant with meds? yes       Allergies (verified): 1)  ! Pcn 2)  ! * Avelox 3)  ! * Protonix  Anticoagulation Management History:      The patient is taking warfarin and comes in today for a routine follow up visit.  Positive risk factors for bleeding include an age of 54 years or older.  The bleeding index is 'intermediate risk'.  Positive CHADS2 values include History of HTN.  Negative CHADS2 values include Age > 27 years old.  The start date was 10/07/2008.  Anticoagulation responsible provider: Tenny Craw MD, Gunnar Fusi.  INR POC: 2.7.  Cuvette Lot#: [id number].  Exp: 08/2010.    Anticoagulation Management Assessment/Plan:      The patient's current anticoagulation dose is Coumadin 2 mg tabs: Take as directed by coumadin clinic..  The target INR is 2 - 3.  The next INR is due 08/08/2009.  Anticoagulation instructions were given to patient.  Results were reviewed/authorized by Cloyde Reams, RN, BSN.  She was notified by Cloyde Reams RN.         Prior Anticoagulation Instructions: INR 3.1    Take 1 tablet today, then resume same dosage 1 tablet daily except 2 tablets on Mondays.   Recheck in 3-4 weeks.    Current Anticoagulation Instructions: INR 2.7  Continue on same dosage 1 tablet daily except 2 tablets on Mondays.   Recheck in 4 weeks.

## 2010-08-01 NOTE — Medication Information (Signed)
Summary: rov/ewj  Anticoagulant Therapy  Managed by: Weston Brass, PharmD PCP: Dr. Buren Kos PMD, Dr. Sherryl Manges EP/Cards,  Supervising MD: Riley Kill MD, Maisie Fus Indication 1: Atrial Fibrillation (ICD-427.31) Lab Used: [facility] Site: [facility] INR POC 2.5 INR RANGE 2 - 3  Dietary changes: no    Health status changes: no    Bleeding/hemorrhagic complications: no    Recent/future hospitalizations: no    Any changes in medication regimen? no    Recent/future dental: yes     Details: Dental implants scheduled for 10/8- will need to be off Coumadin 4 days prior  Any missed doses?: yes     Details: Last Monday, took 1 tablet instead of the scheduled 2 tablets  Is patient compliant with meds? yes       Allergies: 1)  ! Pcn 2)  ! * Avelox 3)  ! * Protonix  Anticoagulation Management History:      The patient is taking warfarin and comes in today for a routine follow up visit.  Positive risk factors for bleeding include an age of 75 years or older.  The bleeding index is 'intermediate risk'.  Positive CHADS2 values include History of HTN.  Negative CHADS2 values include Age > 70 years old.  The start date was 10/07/2008.  Anticoagulation responsible provider: Riley Kill MD, Maisie Fus.  INR POC: 2.5.  Cuvette Lot#: [id number].  Exp: 05/2010.    Anticoagulation Management Assessment/Plan:      The patient's current anticoagulation dose is Coumadin 2 mg tabs: Take as directed by coumadin clinic..  The target INR is 2 - 3.  The next INR is due 04/07/2009.  Anticoagulation instructions were given to patient.  Results were reviewed/authorized by Weston Brass, PharmD.  She was notified by Massie Bougie, PharmD [facility]..         Prior Anticoagulation Instructions: INR 3.3  Skip today's dose of coumadin then resume 1 tablet daily except 2 tabs on Mondays.  Recheck in 4 weeks.    Current Anticoagulation Instructions: INR 2.5   The patient is to continue with the same dose of coumadin.  This dosage includes:

## 2010-08-01 NOTE — Medication Information (Signed)
Summary: rov/ewj  Anticoagulant Therapy  Managed by: Reina Fuse, PharmD PCP: Dr. Buren Kos PMD, Dr. Sherryl Manges EP/Cards,  Supervising MD: Clifton James MD, Cristal Deer Indication 1: Atrial Fibrillation (ICD-427.31) Lab Used: [facility] Site: [facility] INR POC 2.6 INR RANGE 2 - 3  Dietary changes: yes       Details: has been eating a few more servings of greens  Health status changes: no    Bleeding/hemorrhagic complications: no    Recent/future hospitalizations: no    Any changes in medication regimen? no    Recent/future dental: yes     Details: scheduled for implants on 03/10/10  Any missed doses?: no       Is patient compliant with meds? yes      Comments: Scheduled for 2 dental implants on September 9th.   Current Medications (verified): 1)  Prilosec 20 Mg Cpdr (Omeprazole) .... Take  One 30-60 Min Before First Meal of The Day 2)  Coumadin 2 Mg Tabs (Warfarin Sodium) .... Take As Directed By Coumadin Clinic. 3)  Meclizine Hcl 25 Mg Chew (Meclizine Hcl) .... Three Times A Day As Needed 4)  Pravachol 20 Mg Tabs (Pravastatin Sodium) .[name] Kitchen.. 1 Tab At Bedtime 5)  Lorazepam 0.5 Mg Tabs (Lorazepam) .... Three Times A Day As Needed 6)  Levoxyl 50 Mcg Tabs (Levothyroxine Sodium) .[name] Kitchen.. 1 Tab Once Daily 7)  Potassimin 75 Mg Tabs (Potassium) .[name] Kitchen.. 1 Tab Once Daily 8)  Tiazac 300 Mg Xr24h-Cap (Diltiazem Hcl Er Beads) .[name] Kitchen.. 1 Tab Once Daily 9)  Astepro 137 Mcg/spray Soln (Azelastine Hcl) .... 2 Sprays As Needed 10)  Benicar 40 Mg Tabs (Olmesartan Medoxomil) .... Take 1 Tablet By Mouth Once A Day 11)  Symbicort 80-4.5 Mcg/act Aero (Budesonide-Formoterol Fumarate) .... 2 Puffs Daily As Needed  Allergies (verified): 1)  ! Pcn 2)  ! * Avelox 3)  ! * Protonix  Anticoagulation Management History:      The patient is taking warfarin and comes in today for a routine follow up visit.  Positive risk factors for bleeding include an age of 75 years or older.  The bleeding index is  'intermediate risk'.  Positive CHADS2 values include History of HTN.  Negative CHADS2 values include Age > 81 years old.  The start date was 10/07/2008.  Anticoagulation responsible provider: Clifton James MD, Cristal Deer.  INR POC: 2.6.  Cuvette Lot#: [id number].  Exp: 04/2011.    Anticoagulation Management Assessment/Plan:      The patient's current anticoagulation dose is Coumadin 2 mg tabs: Take as directed by coumadin clinic..  The target INR is 2 - 3.  The next INR is due 03/02/2010.  Anticoagulation instructions were given to patient.  Results were reviewed/authorized by Reina Fuse, PharmD.  She was notified by Reina Fuse PharmD.         Prior Anticoagulation Instructions: INR 3.7  Skip today's dosage of coumadin then start taking 1 tablet daily.  Recheck in 2 weeks.    Current Anticoagulation Instructions: INR 2.6  Continue Coumadin 1 tab (2 mg) daily. Return to clinic in 4 weeks.

## 2010-08-01 NOTE — Medication Information (Signed)
Summary: rov-tp  Anticoagulant Therapy  Managed by: Cloyde Reams, RN, BSN PCP: Dr. Buren Kos PMD, Dr. Sherryl Manges EP/Cards,  Supervising MD: Graciela Husbands MD, Viviann Spare Indication 1: Atrial Fibrillation (ICD-427.31) Lab Used: [facility] Site: [facility] INR POC 3.0 INR RANGE 2 - 3  Dietary changes: no    Health status changes: no    Bleeding/hemorrhagic complications: no    Recent/future hospitalizations: no    Any changes in medication regimen? yes       Details: Continues on Doxycycline, has been on since 11/09/09.  will complete on 12/10/09.    Recent/future dental: no  Any missed doses?: no       Is patient compliant with meds? yes       Allergies: 1)  ! Pcn 2)  ! * Avelox 3)  ! * Protonix  Anticoagulation Management History:      The patient is taking warfarin and comes in today for a routine follow up visit.  Positive risk factors for bleeding include an age of 79 years or older.  The bleeding index is 'intermediate risk'.  Positive CHADS2 values include History of HTN.  Negative CHADS2 values include Age > 41 years old.  The start date was 10/07/2008.  Anticoagulation responsible provider: Graciela Husbands MD, Viviann Spare.  INR POC: 3.0.  Cuvette Lot#: [id number].  Exp: 01/2011.    Anticoagulation Management Assessment/Plan:      The patient's current anticoagulation dose is Coumadin 2 mg tabs: Take as directed by coumadin clinic..  The target INR is 2 - 3.  The next INR is due 12/22/2009.  Anticoagulation instructions were given to patient.  Results were reviewed/authorized by Cloyde Reams, RN, BSN.  She was notified by Cloyde Reams RN.         Prior Anticoagulation Instructions: The patient is to continue with the same dose of coumadin.  This dosage includes: 2mg  daily except 4mg  on Mondays.  Current Anticoagulation Instructions: INR 3.0  Take 1/2 tablet today then resume same dosage 1 tablet daily except 2 tablets on Mondays.  Recheck in 3 weeks.

## 2010-08-01 NOTE — Assessment & Plan Note (Signed)
Summary: 9 months/apc   Visit Type:  Follow-up Copy to:  Dr. Silvestre Mesi Primary Provider/Referring Provider:  Dr Marlan Palau  CC:  Pt here for 9 month follow-up. Pt states breathing is doing well. [name] Kitchen  History of Present Illness: 74 yowf never smoker with dx of bronchiectasis and dysnea due to bronchiectasis/overweight.  OV  04/04/2010: Followup Bronchiectasis. Last seen August 2010, Dec 2010, Jan 2011 and FEb 2011. Last visit was for AE bronchiectasis. Since then doing well. Finishd 3 months rehab. COmpletely asymptomatic now. Denies cough or dyspnea. . No other specific complaints.  Denies chest pain, dyspnea, orthopnea, hemoptysis, fever, n/v/d, edema, headache.  Uses symbicort as needed which is around 2-3 times per week.   Other things: switched PMD to Dr. Lenord Fellers. had flu shot. Dr Graciela Husbands considering pradaxa.   Preventive Screening-Counseling & Management  Alcohol-Tobacco     Smoking Status: never  Current Medications (verified): 1)  Prilosec 20 Mg Cpdr (Omeprazole) .... Take  One 30-60 Min Before First Meal of The Day 2)  Coumadin 2 Mg Tabs (Warfarin Sodium) .... Take As Directed By Coumadin Clinic. 3)  Meclizine Hcl 25 Mg Chew (Meclizine Hcl) .... Three Times A Day As Needed 4)  Pravachol 20 Mg Tabs (Pravastatin Sodium) .[name] Kitchen.. 1 Tab At Bedtime 5)  Lorazepam 0.5 Mg Tabs (Lorazepam) .... Three Times A Day As Needed 6)  Levoxyl 50 Mcg Tabs (Levothyroxine Sodium) .[name] Kitchen.. 1 Tab Once Daily 7)  Potassimin 75 Mg Tabs (Potassium) .[name] Kitchen.. 1 Tab Once Daily 8)  Tiazac 300 Mg Xr24h-Cap (Diltiazem Hcl Er Beads) .[name] Kitchen.. 1 Tab Once Daily 9)  Astepro 137 Mcg/spray Soln (Azelastine Hcl) .... 2 Sprays As Needed 10)  Benicar 40 Mg Tabs (Olmesartan Medoxomil) .... Take 1 Tablet By Mouth Once A Day 11)  Symbicort 80-4.5 Mcg/act Aero (Budesonide-Formoterol Fumarate) .... 2 Puffs Daily As Needed  Allergies (verified): 1)  ! Pcn 2)  ! * Avelox 3)  ! * Protonix  Past History:  Past medical, surgical, family  and social histories (including risk factors) reviewed, and no changes noted (except as noted below).  Past Medical History: Bronchiectasis      - PFT 07/13/2008  Fev 1.9L/76%, FVC 2.45L/74, Ratio 79, TLC 121%, DLCO 64% AE BRonchiectasis  - Dec 2010.New Rx:  outpatient  - Feb 2011 - Rx outpatient Hypothyroidism Dyslipidemia Eczema Iron deficiency anemia - Hgb 9.7gm% on 07/13/2008 in [place] -  Hgg 129gm% wiht normal irone levsl and ferritin 10/27/2008 in [place] Recurrent otitis/sinusitis Glaucoma/cataracts Atrial Fib s/p pneumovax - 2007 Normal renal function. Creat 0.7mg % 10/27/2008 #Elevated CK   - July 2010 - ? cause #Admission July 2010 for A Fib RVR  Past Surgical History: Reviewed history from 11/02/2008 and no changes required. Cataract surgery TAH Herniated Disc-2009 s/p tube in middle ear  Family History: Reviewed history from 11/01/2008 and no changes required. Father-[employer]er Lung Mother- CVA, hypertension 4 siblings- atrial fib Brother- Lung Ca and emphysema  Social History: Reviewed history from 07/29/2009 and no changes required. Married  Tobacco Use - No.  Alcohol Use - no 2 children 4 grandchildren  moved to [place] from [place] 2009 Retired Runner, broadcasting/film/video Grew up in [place]. Moved to [place]. Moved to [place] Feb 2010 due to duaghther living in [place]. Son lives in [place], [place]. Husband works as Production designer, theatre/television/film in [place].  Review of Systems  The patient denies shortness of breath with activity, shortness of breath at rest, productive cough, non-productive cough, coughing up blood, chest  pain, irregular heartbeats, acid heartburn, indigestion, loss of appetite, weight change, abdominal pain, difficulty swallowing, sore throat, tooth/dental problems, headaches, nasal congestion/difficulty breathing through nose, sneezing, itching, ear ache, anxiety, depression, hand/feet swelling, joint stiffness or pain, rash, change in color of mucus, and  fever.    Vital Signs:  Patient profile:   77 year old female Height:      68 inches Weight:      190 pounds BMI:     28.99 O2 Sat:      96 % on Room air Temp:     98.4 degrees F oral Pulse rate:   85 / minute BP sitting:   118 / 64  (left arm) Cuff size:   regular  Vitals Entered By: Carron Curie CMA (April 04, 2010 1:57 PM)  O2 Flow:  Room air CC: Pt here for 9 month follow-up. Pt states breathing is doing well.  Comments Medications reviewed with patient .dign Daytime phone number verified with patient.    Physical Exam  General:  well developed, well nourished, in no acute distress. Overweight Head:  normocephalic and atraumatic Eyes:  PERRLA/EOM intact; conjunctiva and sclera clear Ears:  TMs intact and clear with normal canals Nose:  no deformity, discharge, inflammation, or lesions Mouth:  no deformity or lesions Neck:  no masses, thyromegaly, or abnormal cervical nodes Chest Wall:  no deformities noted Lungs:  clear bilaterally to auscultation and percussion Heart:  regular rate and rhythm, S1, S2 without murmurs, rubs, gallops, or clicks Abdomen:  bowel sounds positive; abdomen soft and non-tender without masses, or organomegaly Msk:  no deformity or scoliosis noted with normal posture Pulses:  pulses normal Extremities:  no clubbing, cyanosis, edema, or deformity noted Neurologic:  CN II-XII grossly intact with normal reflexes, coordination, muscle strength and tone Skin:  intact without lesions or rashes Cervical Nodes:  no significant adenopathy Axillary Nodes:  no significant adenopathy Psych:  alert and cooperative; normal mood and affect; normal attention span and concentration   Impression & Recommendations:  Problem # 1:  BRONCHIECTASIS (ICD-494.0) Assessment Unchanged stable diseaese. s/p flu shot yesterday  plan advised to use symbicort atleast 1 puff two times a day during winter months continue daily exercise pro-air as needed mdi  tech reinforced check alpha 1 due to family hx of copd rov 9-10 months  Problem # 2:  POSTNASAL DRIP (ICD-784.91) Assessment: Unchanged  try netti pot  Orders: Est. Patient Level III (53664)  Medications Added to Medication List This Visit: 1)  Proair Hfa 108 (90 Base) Mcg/act Aers (Albuterol sulfate) .[name] Kitchen.. 1-2 puffs every 4-6 hours as needed  Other Orders: T- * Misc. Laboratory test [phone])  Patient Instructions: 1)  continue symbicort atleast 1 puff two times a day through winter 2)  take some samples of symbicort 3)  show technique to my nurse 4)  use pro-air 2 puff as needed if symbicort not helping 5)  use netti pot saline wash 3 times a week to once daily atleast 6)  see if nett pot helps come off astepro 7)  continue daily exercise 8)  return in 10 months or sooner if worse 9)  we will send blood for alpha 1 genetic cause of copd Prescriptions: PROAIR HFA 108 (90 BASE) MCG/ACT  AERS (ALBUTEROL SULFATE) 1-2 puffs every 4-6 hours as needed  #1 x 6   Entered and Authorized by:   Kalman Shan MD   Signed by:   Kalman Shan MD on 04/04/2010   Method  used:   Electronically to        [facility] [address] # [phone]* (retail)       [address]       [place], [place]  [address]       Ph: [phone]       Fax: [phone]   RxID:   [id number] SYMBICORT 80-4.5 MCG/ACT AERO (BUDESONIDE-FORMOTEROL FUMARATE) 2 puffs daily as needed  #1 x 6   Entered and Authorized by:   Kalman Shan MD   Signed by:   Kalman Shan MD on 04/04/2010   Method used:   Electronically to        [facility] [address] # [phone]* (retail)       [address]       [place], [place]  [address]       Ph: [phone]       Fax: [phone]   RxID:   [id number]    Immunization History:  Influenza Immunization History:    Influenza:  fluvax 3+ (04/03/2010)

## 2010-08-01 NOTE — Medication Information (Signed)
Summary: rov/ewj  Anticoagulant Therapy  Managed by: Cloyde Reams, RN, BSN PCP: Dr. Buren Kos PMD, Dr. Sherryl Manges EP/Cards,  Supervising MD: Antoine Poche MD, Fayrene Fearing Indication 1: Atrial Fibrillation (ICD-427.31) Lab Used: [facility] Site: [facility] INR RANGE 2 - 3  Dietary changes: no    Health status changes: no    Bleeding/hemorrhagic complications: no    Recent/future hospitalizations: no    Any changes in medication regimen? no    Recent/future dental: no  Any missed doses?: no       Is patient compliant with meds? yes       Current Medications (verified): 1)  Prilosec 20 Mg Cpdr (Omeprazole) .[name] Kitchen.. 1 Tab Once Daily 2)  Coumadin 2 Mg Tabs (Warfarin Sodium) .... Take As Directed By Coumadin Clinic. 3)  Meclizine Hcl 25 Mg Chew (Meclizine Hcl) .... Three Times A Day As Needed 4)  Pravachol 20 Mg Tabs (Pravastatin Sodium) .[name] Kitchen.. 1 Tab At Bedtime 5)  Lorazepam 0.5 Mg Tabs (Lorazepam) .... Three Times A Day As Needed 6)  Levoxyl 50 Mcg Tabs (Levothyroxine Sodium) .[name] Kitchen.. 1 Tab Once Daily 7)  Potassimin 75 Mg Tabs (Potassium) .[name] Kitchen.. 1 Tab Once Daily 8)  Tiazac 300 Mg Xr24h-Cap (Diltiazem Hcl Er Beads) .[name] Kitchen.. 1 Tab Once Daily 9)  Astepro 137 Mcg/spray Soln (Azelastine Hcl) .... 2 Sprays As Needed 10)  Advair Diskus 250-50 Mcg/dose Misc (Fluticasone-Salmeterol) .... Two Times A Day As Needed 11)  Benicar 40 Mg Tabs (Olmesartan Medoxomil) .... Take 1 Tablet By Mouth Once A Day  Allergies (verified): 1)  ! Pcn 2)  ! * Avelox 3)  ! * Protonix  Anticoagulation Management History:       The patient is taking warfarin and comes in today for a routine follow up visit.  Positive risk factors for bleeding include an age of 41 years or older.  The bleeding index is 'intermediate risk'.  Positive CHADS2 values include History of HTN.  Negative CHADS2 values include Age > 26 years old.  The start date was 10/07/2008.  Anticoagulation responsible provider: Antoine Poche MD, Fayrene Fearing.  Cuvette Lot#: [id number].  Exp: 04/2010.    Anticoagulation Management Assessment/Plan:      The patient's current anticoagulation dose is Coumadin 2 mg tabs: Take as directed by coumadin clinic..  The target INR is 2 - 3.  The next INR is due 03/29/2009.  Anticoagulation instructions were given to patient.  Results were reviewed/authorized by Cloyde Reams, RN, BSN.  She was notified by Cloyde Reams RN.         Prior Anticoagulation Instructions: INR 3.0  Take 1/2 tablet today then resume 1 tablet daily except 2 tablets on Mondays.  Recheck in 4 weeks.    Current Anticoagulation Instructions: INR 3.3  Skip today's dose of coumadin then resume 1 tablet daily except 2 tabs on Mondays.  Recheck in 4 weeks.

## 2010-08-01 NOTE — Assessment & Plan Note (Signed)
Summary: Acute NP office visit - bronchiectasis   Copy to:  Dr. Silvestre Mesi Primary Provider/Referring Provider:  Dr. Buren Kos PMD, Dr. Sherryl Manges EP/Cards,   CC:  dyspnea, tightness in chest, prod cough with small amount green mucus, wheezing, and some sinus pressure/congestion x10days.  History of Present Illness: 56 yowf never smoker with dx of bronchiectasis and dysnea due to bronchiectasis/overweight.  OV 07/29/2009. Last visit was in August 2010. Since then she came into office acutely on Jun 28, 2009 for acute flare. This has since responded well to antibiotics (doxy made her nauseated). In terms of dysonea, she feels significantly better. Attributes this to pulmonary rehab. She spent 5 minutes singing praises about pulmonary rehab. Cough is very rare. She wants to switch advair to symbicort due to hoarseness. She is taking advair prn and has had no flares due to that and is wondering if she can continue the same for symbciort as well. No other specific complaints.  mana  August 18, 2009--Presents for an acute office visit. Complains of dyspnea, tightness in chest, prod cough with small amount green mucus, wheezing, some sinus pressure/congestion x10days .OTC not helping. Denies chest pain, dyspnea, orthopnea, hemoptysis, fever, n/v/d, edema, headache.   Medications Prior to Update: 1)  Prilosec 20 Mg Cpdr (Omeprazole) .... Take  One 30-60 Min Before First Meal of The Day 2)  Coumadin 2 Mg Tabs (Warfarin Sodium) .... Take As Directed By Coumadin Clinic. 3)  Meclizine Hcl 25 Mg Chew (Meclizine Hcl) .... Three Times A Day As Needed 4)  Pravachol 20 Mg Tabs (Pravastatin Sodium) .[name] Kitchen.. 1 Tab At Bedtime 5)  Lorazepam 0.5 Mg Tabs (Lorazepam) .... Three Times A Day As Needed 6)  Levoxyl 50 Mcg Tabs (Levothyroxine Sodium) .[name] Kitchen.. 1 Tab Once Daily 7)  Potassimin 75 Mg Tabs (Potassium) .[name] Kitchen.. 1 Tab Once Daily 8)  Tiazac 300 Mg Xr24h-Cap (Diltiazem Hcl Er Beads) .[name] Kitchen.. 1 Tab Once Daily 9)   Astepro 137 Mcg/spray Soln (Azelastine Hcl) .... 2 Sprays As Needed 10)  Symbicort 160-4.5 Mcg/act  Aero (Budesonide-Formoterol Fumarate) .... Two Puffs Twice Daily 11)  Benicar 40 Mg Tabs (Olmesartan Medoxomil) .... Take 1 Tablet By Mouth Once A Day  Current Medications (verified): 1)  Prilosec 20 Mg Cpdr (Omeprazole) .... Take  One 30-60 Min Before First Meal of The Day 2)  Coumadin 2 Mg Tabs (Warfarin Sodium) .... Take As Directed By Coumadin Clinic. 3)  Meclizine Hcl 25 Mg Chew (Meclizine Hcl) .... Three Times A Day As Needed 4)  Pravachol 20 Mg Tabs (Pravastatin Sodium) .[name] Kitchen.. 1 Tab At Bedtime 5)  Lorazepam 0.5 Mg Tabs (Lorazepam) .... Three Times A Day As Needed 6)  Levoxyl 50 Mcg Tabs (Levothyroxine Sodium) .[name] Kitchen.. 1 Tab Once Daily 7)  Potassimin 75 Mg Tabs (Potassium) .[name] Kitchen.. 1 Tab Once Daily 8)  Tiazac 300 Mg Xr24h-Cap (Diltiazem Hcl Er Beads) .[name] Kitchen.. 1 Tab Once Daily 9)  Astepro 137 Mcg/spray Soln (Azelastine Hcl) .... 2 Sprays As Needed 10)  Symbicort 160-4.5 Mcg/act  Aero (Budesonide-Formoterol Fumarate) .... Two Puffs Twice Daily 11)  Benicar 40 Mg Tabs (Olmesartan Medoxomil) .... Take 1 Tablet By Mouth Once A Day  Allergies (verified): 1)  ! Pcn 2)  ! * Avelox 3)  ! * Protonix  Past History:  Past Medical History: Last updated: 06/28/2009 Bronchiectasis      - PFT 07/13/2008  Fev 1.9L/76%, FVC 2.45L/74, Ratio 79, TLC 121%, DLCO 64% Hypothyroidism Dyslipidemia Eczema Iron deficiency anemia -> Hgb 9.7gm% on 07/13/2008 in  [place] -> Hgg 129gm% wiht normal irone levsl and ferritin 10/27/2008 in [place] Recurrent otitis/sinusitis Glaucoma/cataracts Atrial Fib s/p pneumovax - 2007 Normal renal function. Creat 0.7mg % 10/27/2008 #Elevated CK  >July 2010 - ? cause #Admission July 2010 for A Fib RVR  Past Surgical History: Last updated: 11/02/2008 Cataract surgery TAH Herniated Disc-2009 s/p tube in middle ear  Family History: Last updated: 11/01/2008 Father-[employer]  Lung Mother- CVA, hypertension 4 siblings- atrial fib Brother- Lung Ca  Social History: Last updated: 07/29/2009 Married  Tobacco Use - No.  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 Moved to [place]. Moved to [place] Feb 2010 due to duaghther living in [place]. Son lives in [place], [place]. Husband works as Production designer, theatre/television/film in [place].  Risk Factors: Smoking Status: never (10/22/2008)  Review of Systems      See HPI  Vital Signs:  Patient profile:   77 year old female Height:      68 inches Weight:      201.25 pounds BMI:     30.71 O2 Sat:      99 % on Room air Temp:     97.6 degrees F oral Pulse rate:   72 / minute BP sitting:   128 / 78  (left arm) Cuff size:   regular  Vitals Entered By: Boone Master CNA (August 18, 2009 12:18 PM)  O2 Flow:  Room air CC: dyspnea, tightness in chest, prod cough with small amount green mucus, wheezing, some sinus pressure/congestion x10days Is Patient Diabetic? No Comments Medications reviewed with patient Daytime contact number verified with patient. Boone Master CNA  August 18, 2009 12:18 PM    Physical Exam  Additional Exam:  wt 191 > 202 June 28, 2009>>201 August 18, 2009  HEENT mild turbinate edema, mucoid secretions only sltly increased.  Oropharynx no thrush or excess pnd or cobblestoning.  No JVD or cervical adenopathy. Mild accessory muscle hypertrophy. Trachea midline, nl thryroid. Chest was hyperinflated by percussion with diminished breath sounds and moderate increased exp time without wheeze. Hoover sign positive at mid inspiration. Regular rate and rhythm without murmur gallop or rub or increase P2 or edema.  Abd: no hsm, nl excursion. Ext warm without cyanosis or clubbing.      Impression & Recommendations:  Problem # 1:  BRONCHIECTASIS (ICD-494.0) Flare  REC:  Levaquin 500mg  once daily for 10 days  Mucinex  DM two times a day as needed cough/congesiton  Let the coumadin clinic know you are new med/antibiotic Please contact office for sooner follow up if symptoms do not improve or worsen  follow up Dr. Marchelle Gearing as scheduled and as needed   Medications Added to Medication List This Visit: 1)  Levaquin 500 Mg Tabs (Levofloxacin) .[name] Kitchen.. 1 by mouth once daily  Complete Medication List: 1)  Prilosec 20 Mg Cpdr (Omeprazole) .... Take  one 30-60 min before first meal of the day 2)  Coumadin 2 Mg Tabs (Warfarin sodium) .... Take as directed by coumadin clinic. 3)  Meclizine Hcl 25 Mg Chew (Meclizine hcl) .... Three times a day as needed 4)  Pravachol 20 Mg Tabs (Pravastatin sodium) .[name] Kitchen.. 1 tab at bedtime 5)  Lorazepam 0.5 Mg Tabs (Lorazepam) .... Three times a day as needed 6)  Levoxyl 50 Mcg Tabs (Levothyroxine sodium) .[name] Kitchen.. 1 tab once daily 7)  Potassimin 75 Mg Tabs (Potassium) .[name] Kitchen.. 1 tab once daily 8)  Tiazac  300 Mg Xr24h-cap (Diltiazem hcl er beads) .[name] Kitchen.. 1 tab once daily 9)  Astepro 137 Mcg/spray Soln (Azelastine hcl) .... 2 sprays as needed 10)  Symbicort 160-4.5 Mcg/act Aero (Budesonide-formoterol fumarate) .... Two puffs twice daily 11)  Benicar 40 Mg Tabs (Olmesartan medoxomil) .... Take 1 tablet by mouth once a day 12)  Levaquin 500 Mg Tabs (Levofloxacin) .[name] Kitchen.. 1 by mouth once daily  Other Orders: Est. Patient Level IV ([address])  Patient Instructions: 1)  Levaquin 500mg  once daily for 10 days  2)  Mucinex DM two times a day as needed cough/congesiton  3)  Let the coumadin clinic know you are new med/antibiotic 4)  Please contact office for sooner follow up if symptoms do not improve or worsen  5)  follow up Dr. Marchelle Gearing as scheduled and as needed  Prescriptions: LEVAQUIN 500 MG TABS (LEVOFLOXACIN) 1 by mouth once daily  #10 x 0   Entered and Authorized by:   Rubye Oaks NP   Signed by:   Boone Master CNA on 08/18/2009   Method used:   Electronically to        [facility] [address] #  [phone]* (retail)       [address]       [place], [place]  [address]       Ph: [phone]       Fax: [phone]   RxID:   [id number]

## 2010-08-01 NOTE — Miscellaneous (Signed)
Summary: Pulm Progress Note/[facility]  Pulm Progress Note/[facility]   Imported By: Sherian Rein 10/06/2009 11:12:22  _____________________________________________________________________  External Attachment:    Type:   Image     Comment:   External Document

## 2010-08-01 NOTE — Letter (Signed)
Summary: Appointment - Reschedule  [facility], Main Office  [address]   [place], [place] [address]   Phone: [phone]  Fax: [phone]     Nov 03, 2008 MRN: [id number]   Stephanie Cortez [address] [place], [place]  [address]   Dear Ms. Paszkiewicz,   Due to a change in our office schedule, your appointment on February 15, 2009 at 2:15 must be changed.  It is very important that we reach you to reschedule this appointment. We look forward to participating in your health care needs. Please contact us at the number listed above at your earliest convenience to reschedule this appointment.  Sincerely,  Tad Moore Scheduling Team

## 2010-08-01 NOTE — Assessment & Plan Note (Signed)
Summary: 4 MO FU   Referring Provider:  Dr. Silvestre Mesi Primary Provider:  Dr. Buren Kos PMD, Dr. Sherryl Manges EP/Cards,   CC:  4 month follo w up.  History of Present Illness: Stephanie Cortez is seen to establish cardiac followup.  She has a long-standing history of atrial fibrillation dating back about one decade. She currently is in atrial fibrillation that is permanent. She has failed multiple cardioversions and anti arrhythmic drug trials with flecainide, amiodarone, and sotalol. He has been left in atrial fibrillation and currently she is largely asymptomatic. She denies palpitations. She denies exercise intolerance.  Thromboembolic risk factors include hypertension, gender, and age.  Her cardiac evaluation in the past has included an echo in 2005 notable for mitral regurgitation and tricuspid regurgitation both of which were rated as moderate. She had left atrial enlargement and normal left ventricular function. Multiple Myoview scans have demonstrated normal left ventricular function and no ischemia.  Current Medications (verified): 1)  Prilosec 20 Mg Cpdr (Omeprazole) .[name] Kitchen.. 1 Tab Once Daily 2)  Coumadin 2 Mg Tabs (Warfarin Sodium) .... Take As Directed By Coumadin Clinic. 3)  Meclizine Hcl 25 Mg Chew (Meclizine Hcl) .... Three Times A Day As Needed 4)  Pravachol 20 Mg Tabs (Pravastatin Sodium) .[name] Kitchen.. 1 Tab At Bedtime 5)  Lorazepam 0.5 Mg Tabs (Lorazepam) .... Three Times A Day As Needed 6)  Levoxyl 50 Mcg Tabs (Levothyroxine Sodium) .[name] Kitchen.. 1 Tab Once Daily 7)  Potassimin 75 Mg Tabs (Potassium) .[name] Kitchen.. 1 Tab Once Daily 8)  Tiazac 300 Mg Xr24h-Cap (Diltiazem Hcl Er Beads) .[name] Kitchen.. 1 Tab Once Daily 9)  Astepro 137 Mcg/spray Soln (Azelastine Hcl) .... 2 Sprays As Needed 10)  Advair Diskus 250-50 Mcg/dose Misc (Fluticasone-Salmeterol) .... Two Times A Day As Needed 11)  Benicar 40 Mg Tabs (Olmesartan Medoxomil) .... Take 1 Tablet By Mouth Once A Day  Allergies (verified): 1)  ! Pcn  2)  ! * Avelox 3)  ! * Protonix  Past History:  Past Medical History: Last updated: 02/14/2009 Bronchiectasis >PFT 07/13/2008  Fev 1.9L/76%, FVC 2.45L/74, Ratio 79, TLC 121%, DLCO 64% Hypothyroidism Dyslipidemia Eczema Iron deficiency anemia -> Hgb 9.7gm% on 07/13/2008 in [place] -> Hgg 129gm% wiht normal irone levsl and ferritin 10/27/2008 in [place] Recurrent otitis/sinusitis Glaucoma/cataracts Atrial Fib s/p pneumovax - 2007 Normal renal function. Creat 0.7mg % 10/27/2008 #Elevated CK  >July 2010 - ? cause #Admission July 2010 for A Fib RVR  Vital Signs:  Patient profile:   77 year old female Height:      68 inches Weight:      191 pounds BMI:     29.15 Pulse rate:   74 / minute Pulse rhythm:   irregular BP sitting:   128 / 79  (left arm) Cuff size:   large  Vitals Entered By: Judithe Modest CMA (February 22, 2009 10:18 AM)  Physical Exam  General:  The patient was alert and oriented in no acute distress.Neck veins were flat, carotids were brisk. Lungs were clear. Heart sounds were irregular without murmurs or gallops. Abdomen was soft with active bowel sounds. There is no clubbing cyanosis or edema.    EKG  Procedure date:  02/22/2009  Findings:      atrial fibrillation at 79 Intervals-/0.07/0.38 Axis XV Low-voltage   Impression & Recommendations:  Problem # 1:  ATRIAL FIBRILLATION (ICD-427.31) 8 for fibrillation remains permanent. She tolerates it without untoward effects. She had a recent hospitalization with left-sided dysesthesias. It was felt that this was not  a TIA although I guess he certainly could have been. It occurred in the context of being extremely dehydrated and having worked all day long moving into a new house on 103 day Orders: EKG w/ Interpretation (93000)  Patient Instructions: 1)  Your physician recommends that you schedule a follow-up appointment in: 6 MONTHS

## 2010-08-01 NOTE — Medication Information (Signed)
Summary: rov/tm  Anticoagulant Therapy  Managed by: Cloyde Reams, RN, BSN PCP: Dr. Buren Kos PMD, Dr. Sherryl Manges EP/Cards,  Supervising MD: Eden Emms MD, Theron Arista Indication 1: Atrial Fibrillation (ICD-427.31) Lab Used: [facility] Site: [facility] INR POC 3.2 INR RANGE 2 - 3  Dietary changes: no    Health status changes: no    Bleeding/hemorrhagic complications: no    Recent/future hospitalizations: no    Any changes in medication regimen? yes       Details: zyrtec prn.  Recent/future dental: yes     Details: Dental extraction pending on 11/02/09 by Dr Golden Pop.    Any missed doses?: no       Is patient compliant with meds? yes      Comments: Called spoke with Dr Keturah Barre office   Allergies: 1)  ! Pcn 2)  ! * Avelox 3)  ! * Protonix  Anticoagulation Management History:      The patient is taking warfarin and comes in today for a routine follow up visit.  Positive risk factors for bleeding include an age of 31 years or older.  The bleeding index is 'intermediate risk'.  Positive CHADS2 values include History of HTN.  Negative CHADS2 values include Age > 40 years old.  The start date was 10/07/2008.  Anticoagulation responsible provider: Eden Emms MD, Theron Arista.  INR POC: 3.2.  Cuvette Lot#: [id number].  Exp: 12/2010.    Anticoagulation Management Assessment/Plan:      The patient's current anticoagulation dose is Coumadin 2 mg tabs: Take as directed by coumadin clinic..  The target INR is 2 - 3.  The next INR is due 10/25/2009.  Anticoagulation instructions were given to patient.  Results were reviewed/authorized by Cloyde Reams, RN, BSN.  She was notified by Cloyde Reams RN.         Prior Anticoagulation Instructions: INR 3.0  Continue on same dosage 1 tablet daily except 2 tablets on Mondays.  Recheck in 4 weeks.    Current Anticoagulation Instructions: INR 3.2  Hold today's dosage of coumadin then resume same dosage 1 tablet daily except 2 tablets on Mondays. Called  made OV to check INR on 11/01/09 and fax results to Dr Bradly Chris [phone].

## 2010-08-01 NOTE — Medication Information (Signed)
Summary: rov/cb  Anticoagulant Therapy  Managed by: Cloyde Reams, RN, BSN PCP: Dr. Buren Kos PMD, Dr. Sherryl Manges EP/Cards,  Supervising MD: Myrtis Ser MD, Tinnie Gens Indication 1: Atrial Fibrillation (ICD-427.31) Lab Used: [facility] Site: [facility] INR POC 3.7 INR RANGE 2 - 3  Dietary changes: no    Health status changes: no    Bleeding/hemorrhagic complications: no    Recent/future hospitalizations: no    Any changes in medication regimen? no    Recent/future dental: no  Any missed doses?: no       Is patient compliant with meds? yes       Allergies: 1)  ! Pcn 2)  ! * Avelox 3)  ! * Protonix  Anticoagulation Management History:      The patient is taking warfarin and comes in today for a routine follow up visit.  Positive risk factors for bleeding include an age of 75 years or older.  The bleeding index is 'intermediate risk'.  Positive CHADS2 values include History of HTN.  Negative CHADS2 values include Age > 57 years old.  The start date was 10/07/2008.  Anticoagulation responsible provider: Myrtis Ser MD, Tinnie Gens.  INR POC: 3.7.  Cuvette Lot#: [id number].  Exp: 04/2011.    Anticoagulation Management Assessment/Plan:      The patient's current anticoagulation dose is Coumadin 2 mg tabs: Take as directed by coumadin clinic..  The target INR is 2 - 3.  The next INR is due 02/02/2010.  Anticoagulation instructions were given to patient.  Results were reviewed/authorized by Cloyde Reams, RN, BSN.  She was notified by Cloyde Reams RN.         Prior Anticoagulation Instructions: INR 3.0. Take 1/2 tablet today, then take 1 tablet daily except 2 tablets on Mondays. Recheck in 4 weeks.  Current Anticoagulation Instructions: INR 3.7  Skip today's dosage of coumadin then start taking 1 tablet daily.  Recheck in 2 weeks.

## 2010-08-03 NOTE — Progress Notes (Signed)
Summary: sick-LMOMTCB x 1  Phone Note Call from Patient Call back at Home Phone [phone]   Caller: Patient Call For: ramaswamy Reason for Call: Talk to Nurse Summary of Call: Patient finished Cefdinir 300 Mg last week. Patient started to feel better, but now is experiencing cough, green/yellow sputum, congestion.  Patient is asking for another round of abx.  [facility] [phone] Initial call taken by: Lehman Prom,  June 13, 2010 10:00 AM  Follow-up for Phone Call        Please advise if this is okay. Abigail Miyamoto RN  June 13, 2010 11:03 AM   Additional Follow-up for Phone Call Additional follow up Details #1::        I think she needs steroids becuase abx alone did not work. She can have a) doxyycycline 100mg  by mouth two times a day x 5 days - take after meals only and then b) prednisone 40mg  by mouth daily x 2 days, then 30mg  by mouth daily x 2 days, then 20mg  by mouth daily x 2 days, then 10mg  by mouth daily x 3 days, and then 5mg  by mouth daily x 3 days and sthen stop Additional Follow-up by: Kalman Shan MD,  June 13, 2010 3:32 PM    Additional Follow-up for Phone Call Additional follow up Details #2::    LMOTMCB Vernie Murders  June 13, 2010 3:34 PM   Spoke with pt and notified of the above recs per MR.  Pt verbalized understanding. Rxs were sent to pharm. Follow-up by: Vernie Murders,  June 13, 2010 3:47 PM  New/Updated Medications: DOXYCYCLINE HYCLATE 100 MG CAPS (DOXYCYCLINE HYCLATE) 1 two times a day after meals until gone PREDNISONE 10 MG TABS (PREDNISONE) 4 x 2 days, 3 x 2 days, 2 x 2 days, 1 x 3 days, 1/2 x 3 days, then stop Prescriptions: PREDNISONE 10 MG TABS (PREDNISONE) 4 x 2 days, 3 x 2 days, 2 x 2 days, 1 x 3 days, 1/2 x 3 days, then stop  #24 x 0   Entered by:   Vernie Murders   Authorized by:   Kalman Shan MD   Signed by:   Vernie Murders on 06/13/2010   Method used:   Electronically to        [facility]  # [phone]* (retail)       [address]       [place], [place]  [address]       Ph: [phone]       Fax: [phone]   RxID:   [id number] DOXYCYCLINE HYCLATE 100 MG CAPS (DOXYCYCLINE HYCLATE) 1 two times a day after meals until gone  #10 x 0   Entered by:   Vernie Murders   Authorized by:   Kalman Shan MD   Signed by:   Vernie Murders on 06/13/2010   Method used:   Electronically to        [facility] # [phone]* (retail)       [address]       [place], [place]  [address]       Ph: [phone]       Fax: [phone]   RxID:   [id number]

## 2010-08-29 ENCOUNTER — Ambulatory Visit (INDEPENDENT_AMBULATORY_CARE_PROVIDER_SITE_OTHER): Payer: BC Managed Care – PPO | Admitting: Internal Medicine

## 2010-08-29 DIAGNOSIS — I4891 Unspecified atrial fibrillation: Secondary | ICD-10-CM

## 2010-08-29 DIAGNOSIS — R0609 Other forms of dyspnea: Secondary | ICD-10-CM

## 2010-08-29 DIAGNOSIS — R0989 Other specified symptoms and signs involving the circulatory and respiratory systems: Secondary | ICD-10-CM

## 2010-09-23 ENCOUNTER — Encounter: Payer: Self-pay | Admitting: Internal Medicine

## 2010-10-02 ENCOUNTER — Encounter: Payer: Self-pay | Admitting: Internal Medicine

## 2010-10-02 ENCOUNTER — Ambulatory Visit (INDEPENDENT_AMBULATORY_CARE_PROVIDER_SITE_OTHER): Payer: BC Managed Care – PPO | Admitting: Internal Medicine

## 2010-10-02 DIAGNOSIS — J479 Bronchiectasis, uncomplicated: Secondary | ICD-10-CM

## 2010-10-02 NOTE — Patient Instructions (Addendum)
This is your correct set of instructions Your breathing test is unchanged since 2010 So I am unclear why you are more tired Please use  Your symbicort daily Please talk to Dr. Lenord Fellers about your tiredness I will call you in several weeks with alpha 1 results Return to see me in 6-9 months or sooner if there are problems

## 2010-10-02 NOTE — Assessment & Plan Note (Addendum)
Non specifically tired this visit. Some more chest tightness. No acute illness. Spirometry shows no change. Advised to continue symbicort and check alpha 1. Monitor clinically. Fu with PMD for tiredness

## 2010-10-02 NOTE — Progress Notes (Signed)
  Subjective:    Patient ID: Stephanie Cortez, female    DOB: December 25, 1933, 77 y.o.   MRN: [id number]  HPI Follouwp Bronchiectasis. Last ollowup Bronchiectasis. Last seen August 2010, Dec 2010, Jan 2011, FEb 2011 (AE bronciectasis), and  Oct 2011. S/p pulmonary rehab in summer 2011.  Since then had one more visit in Nov 2011 for  AE bronchiectasis. Since then doing well. Denies cough or dyspnea but feels chest is just a bit more tight than usual. . No other specific complaints.  Denies chest pain, dyspnea, orthopnea, hemoptysis, fever, n/v/d, edema, headache.  However, does admit to increased fatigue  + . Uses symbicort as needed which is around 2-3 times per week. Is requesting spirometry  Review of Systems  Constitutional: Negative.        Oveweright, fatigue  HENT: Negative.   Eyes: Negative.   Respiratory: Positive for chest tightness. Negative for cough, choking, shortness of breath, wheezing and stridor.   Cardiovascular: Negative.   Gastrointestinal: Negative.   Genitourinary: Negative.   Musculoskeletal: Positive for arthralgias. Negative for back pain, joint swelling and gait problem.  Neurological: Negative.   Psychiatric/Behavioral: Negative.         Objective:   Physical Exam        Assessment & Plan:

## 2010-10-03 DIAGNOSIS — E039 Hypothyroidism, unspecified: Secondary | ICD-10-CM

## 2010-10-03 DIAGNOSIS — E785 Hyperlipidemia, unspecified: Secondary | ICD-10-CM

## 2010-10-04 ENCOUNTER — Encounter: Payer: Self-pay | Admitting: Internal Medicine

## 2010-10-04 ENCOUNTER — Ambulatory Visit (INDEPENDENT_AMBULATORY_CARE_PROVIDER_SITE_OTHER): Payer: BC Managed Care – PPO | Admitting: Internal Medicine

## 2010-10-04 VITALS — BP 126/72 | HR 94 | Ht 67.0 in | Wt 188.0 lb

## 2010-10-04 DIAGNOSIS — R609 Edema, unspecified: Secondary | ICD-10-CM

## 2010-10-04 DIAGNOSIS — I4891 Unspecified atrial fibrillation: Secondary | ICD-10-CM

## 2010-10-04 MED ORDER — WARFARIN SODIUM 2.5 MG PO TABS: 2.5000 mg | ORAL_TABLET | Freq: Every day | ORAL | Status: DC

## 2010-10-04 NOTE — Assessment & Plan Note (Signed)
The patient's atrial fibrillation is permanent. However, there have been more palpitations. This may be related to increased rate. In the event that the symptoms persist, will plan to undertake a 24-hour Holter monitor to assess heart rate excursion as well as to undertake an echo to see if there has been any change in left ventricular function. With her edema she is advised to make sure she is maintaining a low salt intake.

## 2010-10-04 NOTE — Progress Notes (Signed)
HPI  Stephanie Cortez is a 77 y.o. female seen in followup for long-standing history of atrial fibrillation dating back about one decade. She currently is in atrial fibrillation that is permanent. She has failed multiple cardioversions and anti arrhythmic drug trials with flecainide, amiodarone, and sotalol. She has been left in atrial fibrillation and currently she is largely asymptomatic.   Over recent months she has had problems with palpitations which she has not had in many years. She was seen by Dr. Lenord Fellers; electrocardiogram demonstrated a heart rate of about 90. She also over the last 2 weeks has had problems with peripheral edema. This too has never occurred previously. It has resolved spontaneously.  TSH and hemoglobin are pending w Dr. Lenord Fellers  Thromboembolic risk factors include hypertension, gender, and age.  Her cardiac evaluation in the past has included an echo in 2005 notable for mitral regurgitation and tricuspid regurgitation both of which were rated as moderate. In 2010 She had left atrial enlargement and normal left ventricular function. Multiple Myoview scans have demonstrated normal left ventricular function and no ischemia.   Past Medical History  Diagnosis Date  . Bronchiectasis     >PFT 07/13/2008 in [place]  Fev 1.9L/76%, FVC 2.45L/74, Ratio 79, TLC 121%, DLCO 64%  AE BRonchiectasis - Dec 2010.New Rx:  outpatient - Feb 2011 - Rx outpatient  . Hypothyroidism   . Dyslipidemia   . Eczema   . Anemia     - Hgb 9.7gm% on 07/13/2008 in [place] -  Hgg 129gm% wiht normal irone levsl and ferritin 10/27/2008 in [place] Recurrent otitis/sinusitis  . Atrial fibrillation   . Glaucoma     Past Surgical History  Procedure Date  . Cataract extraction     Current Outpatient Prescriptions  Medication Sig Dispense Refill  . budesonide-formoterol (SYMBICORT) 80-4.5 MCG/ACT inhaler Inhale 2 puffs into the lungs 2 (two) times daily.        . dabigatran (PRADAXA) 150 MG CAPS Take 150 mg by  mouth every 12 (twelve) hours.        [name] Kitchen diltiazem (TIAZAC) 300 MG 24 hr capsule Take 300 mg by mouth daily. Brand name only      . LORazepam (ATIVAN) 0.5 MG tablet Take 0.5 mg by mouth every 8 (eight) hours.        . meclizine (ANTIVERT) 25 MG tablet Take 25 mg by mouth 3 (three) times daily as needed.        . Multiple Vitamin (MULTIVITAMIN) capsule Take 1 capsule by mouth daily.        [name] Kitchen olmesartan (BENICAR) 40 MG tablet Take 40 mg by mouth daily.        [name] Kitchen omeprazole (PRILOSEC) 20 MG capsule Take 20 mg by mouth daily.        . Potassium (POTASSIMIN) 75 MG TABS 1 tab po qd       . doxycycline (VIBRAMYCIN) 100 MG capsule Take 100 mg by mouth 2 (two) times daily.        . pravastatin (PRAVACHOL) 20 MG tablet Take 20 mg by mouth daily.        [name] Kitchen SYNTHROID 50 MCG tablet Take 1 tablet by mouth daily.        Allergies  Allergen Reactions  . Moxifloxacin     REACTION: weakness  . Pantoprazole Sodium     REACTION: rash  . Penicillins     REACTION: rash    Review of Systems negative except from HPI and PMH  Physical Exam Well  developed and well nourished in no acute distress HENT normal E scleral and icterus clear Neck Supple JVP flat; carotids brisk and full Clear to ausculation Irregularly irregular rate and rhythm, no murmurs; Trace edemaand edema Alert and oriented, grossly normal motor and sensory function Skin Warm and Dry  ECGAtrial fibrillation at 94 Intervals-/2008/0.32 Axis is 54  Assessment and  Plan\

## 2010-10-04 NOTE — Assessment & Plan Note (Signed)
As above.

## 2010-10-04 NOTE — Patient Instructions (Signed)
Stop Pradaxa on 4/6 Start Coumadin 2.5 mg today 4/4 See Coumadin clinic on 4/11 Your physician wants you to follow-up in: 4 months You will receive a reminder letter in the mail two months in advance. If you don't receive a letter, please call our office to schedule the follow-up appointment.

## 2010-10-08 LAB — PROTIME-INR
INR: 2.2 — ABNORMAL HIGH (ref 0.00–1.49)
INR: 2.4 — ABNORMAL HIGH (ref 0.00–1.49)
INR: 2.6 — ABNORMAL HIGH (ref 0.00–1.49)
Prothrombin Time: 25.4 seconds — ABNORMAL HIGH (ref 11.6–15.2)
Prothrombin Time: 27.9 seconds — ABNORMAL HIGH (ref 11.6–15.2)
Prothrombin Time: 29.4 seconds — ABNORMAL HIGH (ref 11.6–15.2)

## 2010-10-08 LAB — COMPREHENSIVE METABOLIC PANEL
ALT: 29 U/L (ref 0–35)
AST: 37 U/L (ref 0–37)
Albumin: 4.4 g/dL (ref 3.5–5.2)
Alkaline Phosphatase: 96 U/L (ref 39–117)
BUN: 9 mg/dL (ref 6–23)
CO2: 22 mEq/L (ref 19–32)
Calcium: 9.9 mg/dL (ref 8.4–10.5)
Chloride: 98 mEq/L (ref 96–112)
Creatinine, Ser: 0.8 mg/dL (ref 0.4–1.2)
GFR calc Af Amer: >60 mL/min (ref >60)
GFR calc non Af Amer: >60 mL/min (ref >60)
Glucose, Bld: 140 mg/dL — ABNORMAL HIGH (ref 70–99)
Potassium: 3.6 mEq/L (ref 3.5–5.1)
Sodium: 129 mEq/L — ABNORMAL LOW (ref 135–145)
Total Bilirubin: 1 mg/dL (ref 0.3–1.2)
Total Protein: 7.6 g/dL (ref 6.0–8.3)

## 2010-10-08 LAB — DIFFERENTIAL
Basophils Absolute: 0.1 10*3/uL (ref 0.0–0.1)
Basophils Relative: 1 % (ref 0–1)
Eosinophils Absolute: 0.1 10*3/uL (ref 0.0–0.7)
Eosinophils Relative: 1 % (ref 0–5)
Lymphocytes Relative: 22 % (ref 12–46)
Lymphs Abs: 2.2 10*3/uL (ref 0.7–4.0)
Monocytes Absolute: 0.8 10*3/uL (ref 0.1–1.0)
Monocytes Relative: 8 % (ref 3–12)
Neutro Abs: 6.6 10*3/uL (ref 1.7–7.7)
Neutrophils Relative %: 68 % (ref 43–77)

## 2010-10-08 LAB — TSH: TSH: 1.919 u[IU]/mL (ref 0.350–4.500)

## 2010-10-08 LAB — URINALYSIS, ROUTINE W REFLEX MICROSCOPIC
Bilirubin Urine: NEGATIVE
Glucose, UA: NEGATIVE mg/dL
Ketones, ur: NEGATIVE mg/dL
Leukocytes, UA: NEGATIVE
Nitrite: NEGATIVE
Protein, ur: NEGATIVE mg/dL
Specific Gravity, Urine: 1.007 (ref 1.005–1.030)
Urobilinogen, UA: 0.2 mg/dL (ref 0.0–1.0)
pH: 7 (ref 5.0–8.0)

## 2010-10-08 LAB — CK: Total CK: 193 U/L — ABNORMAL HIGH (ref 7–177)

## 2010-10-08 LAB — BASIC METABOLIC PANEL
BUN: 9 mg/dL (ref 6–23)
CO2: 23 mEq/L (ref 19–32)
Calcium: 9.2 mg/dL (ref 8.4–10.5)
Chloride: 103 mEq/L (ref 96–112)
Creatinine, Ser: 0.75 mg/dL (ref 0.4–1.2)
GFR calc Af Amer: >60 mL/min (ref >60)
GFR calc non Af Amer: >60 mL/min (ref >60)
Glucose, Bld: 140 mg/dL — ABNORMAL HIGH (ref 70–99)
Potassium: 3.9 mEq/L (ref 3.5–5.1)
Sodium: 133 mEq/L — ABNORMAL LOW (ref 135–145)

## 2010-10-08 LAB — LIPID PANEL
Cholesterol: 167 mg/dL (ref 0–200)
HDL: 70 mg/dL (ref >39)
LDL Cholesterol: 88 mg/dL (ref 0–99)
Total CHOL/HDL Ratio: 2.4 RATIO
Triglycerides: 46 mg/dL (ref <150)
VLDL: 9 mg/dL (ref 0–40)

## 2010-10-08 LAB — URINE MICROSCOPIC-ADD ON

## 2010-10-08 LAB — CK TOTAL AND CKMB (NOT AT ARMC)
CK, MB: 5.1 ng/mL — ABNORMAL HIGH (ref 0.3–4.0)
CK, MB: 5.6 ng/mL — ABNORMAL HIGH (ref 0.3–4.0)
CK, MB: 6.5 ng/mL — ABNORMAL HIGH (ref 0.3–4.0)
Relative Index: 2.4 (ref 0.0–2.5)
Relative Index: 2.9 — ABNORMAL HIGH (ref 0.0–2.5)
Relative Index: 3.2 — ABNORMAL HIGH (ref 0.0–2.5)
Total CK: 191 U/L — ABNORMAL HIGH (ref 7–177)
Total CK: 204 U/L — ABNORMAL HIGH (ref 7–177)
Total CK: 209 U/L — ABNORMAL HIGH (ref 7–177)

## 2010-10-08 LAB — D-DIMER, QUANTITATIVE: D-Dimer, Quant: <0.22 ug/mL-FEU (ref 0.00–0.48)

## 2010-10-08 LAB — CBC
HCT: 39.6 % (ref 36.0–46.0)
Hemoglobin: 13.4 g/dL (ref 12.0–15.0)
MCHC: 33.9 g/dL (ref 30.0–36.0)
MCV: 96.7 fL (ref 78.0–100.0)
Platelets: 276 10*3/uL (ref 150–400)
RBC: 4.09 MIL/uL (ref 3.87–5.11)
RDW: 12.6 % (ref 11.5–15.5)
WBC: 9.7 10*3/uL (ref 4.0–10.5)

## 2010-10-08 LAB — TROPONIN I
Troponin I: 0.01 ng/mL (ref 0.00–0.06)
Troponin I: 0.01 ng/mL (ref 0.00–0.06)
Troponin I: 0.02 ng/mL (ref 0.00–0.06)

## 2010-10-08 LAB — HEMOGLOBIN A1C
Hgb A1c MFr Bld: 5.3 % (ref 4.6–6.1)
Mean Plasma Glucose: 105 mg/dL

## 2010-10-08 LAB — APTT: aPTT: 44 seconds — ABNORMAL HIGH (ref 24–37)

## 2010-10-08 LAB — HOMOCYSTEINE: Homocysteine: 7.9 umol/L (ref 4.0–15.4)

## 2010-10-09 ENCOUNTER — Encounter: Payer: Self-pay | Admitting: Internal Medicine

## 2010-10-11 ENCOUNTER — Ambulatory Visit (INDEPENDENT_AMBULATORY_CARE_PROVIDER_SITE_OTHER): Payer: BC Managed Care – PPO | Admitting: *Deleted

## 2010-10-11 ENCOUNTER — Encounter: Payer: Self-pay | Admitting: Internal Medicine

## 2010-10-11 DIAGNOSIS — I4891 Unspecified atrial fibrillation: Secondary | ICD-10-CM

## 2010-10-11 DIAGNOSIS — Z7901 Long term (current) use of anticoagulants: Secondary | ICD-10-CM

## 2010-10-11 LAB — POCT INR: INR: 3.1

## 2010-10-11 NOTE — Patient Instructions (Signed)
Skip today. Then change your Friday dose to 1 tablet. And take 1.5 tablets all other days. Recheck in 1 week.  

## 2010-10-18 ENCOUNTER — Ambulatory Visit (INDEPENDENT_AMBULATORY_CARE_PROVIDER_SITE_OTHER): Payer: BC Managed Care – PPO | Admitting: *Deleted

## 2010-10-18 DIAGNOSIS — I4891 Unspecified atrial fibrillation: Secondary | ICD-10-CM

## 2010-10-18 LAB — POCT INR: INR: 5.1

## 2010-10-18 NOTE — Patient Instructions (Signed)
Skip today. Then change your Friday dose to 1 tablet. And take 1.5 tablets all other days. Recheck in 1 week.

## 2010-10-24 ENCOUNTER — Other Ambulatory Visit: Payer: Self-pay | Admitting: Internal Medicine

## 2010-10-30 ENCOUNTER — Ambulatory Visit (INDEPENDENT_AMBULATORY_CARE_PROVIDER_SITE_OTHER): Payer: BC Managed Care – PPO | Admitting: *Deleted

## 2010-10-30 DIAGNOSIS — I4891 Unspecified atrial fibrillation: Secondary | ICD-10-CM

## 2010-10-30 LAB — POCT INR: INR: 3.6

## 2010-11-13 ENCOUNTER — Ambulatory Visit (INDEPENDENT_AMBULATORY_CARE_PROVIDER_SITE_OTHER): Payer: BC Managed Care – PPO | Admitting: *Deleted

## 2010-11-13 DIAGNOSIS — I4891 Unspecified atrial fibrillation: Secondary | ICD-10-CM

## 2010-11-13 LAB — POCT INR: INR: 2.7

## 2010-11-14 NOTE — H&P (Signed)
Stephanie Cortez, Stephanie Cortez               ACCOUNT NO.:  [id number]   MEDICAL RECORD NO.:  [id number]          PATIENT TYPE:  EMS   LOCATION:  [facility]                         FACILITY:  [facility]   PHYSICIAN:  Lucile Crater, MD         DATE OF BIRTH:  Dec 13, 1933   DATE OF ADMISSION:  01/19/2009  DATE OF DISCHARGE:                              HISTORY & PHYSICAL   PRIMARY CARE PHYSICIAN:  Dr. Martha Cortez.   PRIMARY CARDIOLOGIST:  Dr. Duke Salvia.   CHIEF COMPLAINT:  Shortness of breath, chest pressure, tingling on the  left side of the face.   HISTORY OF PRESENT ILLNESS:  The patient is a 77 year old Caucasian  female who recently moved from [place] to [place].  She is in the  process of moving.  She bought a new house and was moving since the last  4 days.  She reports to have had a lot of stress in her life lately from  the move.  She and her brother were in the midst of rearranging stuff at  home and at 4 p.m. on January 19, 2009, she felt suddenly weak and she also  had dizziness.  The dizziness progressed and she thought she was going  to pass out, but there was no syncopal episode.  EMS was called and she  was brought to the ER.  She also reported that she experienced tightness  in the neck and tightness in the chest at this period.  There was no  specific aggravating or alleviating factors for this chest tightness,  but she has been short of breath.  The pain does not radiate anywhere,  but she has the tingling in the left side of the face as mentioned  above.  The patient has a longstanding history of atrial fibrillation  and is on anticoagulation with Coumadin.  The patient has been moving a  lot of heavy stuff for the past 4-5 days, but she did not have this  intense chest tightness when she was moving the stuff and she never had  any cardiac evaluation done in the past.  Upon further questioning, she  states that the shortness of breath has been going on for the past 3  days, but she  did not bother because she is very tolerant.  She also  experienced some mild tightness in the chest all these days, but not to  the extent that it was today.  She did not get any sublingual  nitroglycerin and did not take aspirin because she was on Coumadin.  She  reports that the dyspnea has very much improved.  She does not have the  chest tightness, but she still has the tingling and numbness in the left  side of the face.  As a part of her initial evaluation in the ER, she  got a CT scan of the head and there was a possible subtle area of  hypoattenuation within the anterior aspect of the right MCA.   REVIEW OF SYSTEMS:  A complete review of systems was done, which  included general, head, eyes,  ears, nose, throat, cardiovascular,  respiratory, GI, GU, endocrine, musculoskeletal, skin, neurologic and  psychiatric and all are within normal limits other than what is  mentioned above.   PAST MEDICAL HISTORY:  1. Paroxysmal atrial fibrillation.  2. Bronchitis/bronchial asthma.  3. Hyperlipidemia.  4. Hypothyroidism.  5. Generalized anxiety disorder.  6. Vertigo.  7. Hypertension.   ALLERGIES:  PENICILLIN.   CURRENT MEDICATIONS AT HOME:  1. Levothyroxine 50 mcg once a day.  2. Advair Diskus 250/50 one inhalation twice a day.  3. Benicar/hydrochlorothiazide 40/25 once a day.  4. Coumadin 2 mg specialized dosing.  5. Hydralazine 50 mg p.o. t.i.d.  6. Meclizine 25 mg p.o. p.r.n.  7. Pravastatin 20 mg p.o. nightly.  8. Tiazac 300 mg p.o. daily.   FAMILY HISTORY:  Her mother had a history of stroke and hypertension.   SOCIAL HISTORY:  She recently moved from [place].  Her daughter lives  here.  There is no history of smoking, alcohol or illicit drug use.   PHYSICAL EXAMINATION:  VITAL SIGNS:  T-max 96.9, pulse rate 85,  respirations 22, blood pressure 157/77.  O2 saturations 100% on room  air.  GENERAL APPEARANCE:  Not in acute distress.  Alert, awake and oriented  x3.   Afebrile.  HEENT:  Normocephalic, atraumatic.  The pupils are equal and react to  light and accommodation.  Extraocular movements are intact.  The mucous  membranes are moist.  NECK:  Supple.  No JVD, lymphadenopathy or carotid bruit.  CV:  Irregular, irregular heart rate.  LUNGS:  Clear to auscultation bilaterally.  ABDOMEN:  Benign.  EXTREMITIES:  No clubbing, cyanosis or edema.  NEUROLOGIC:  On physical, no deficits noted.   LABORATORIES AND STUDIES:  CBC with diff:  WBC 9700, hemoglobin 13.4,  hematocrit 39.6, platelets 276.  INR 2.4.  CK 209, MB 5.1, troponin  0.01.  Sodium 129, potassium 3.6, chloride 98, bicarb 22, glucose 140,  BUN 9, creatinine 0.8, calcium 9.9.  Total protein 7.6, albumin 7.4, ALT 29, AST 37.  Chest x-ray one-view:  1) Severe emphysema and cardiomegaly without  failure.  2)  Left mid lung peripheral pulmonary nodule.  CT of the head without contrast:  A subtle possibility of  hypoattenuation within the anterior MCA distribution on the right,  likely artifactual hypoattenuation about the medial aspect of the right  frontal lobe.  A small amount of hemorrhage is felt less likely.   ASSESSMENT/PLAN:  1. Left sided tingling and numbness with a questionable area of      hypoattenuation on the CT scan.  The patient still has persistent      numbness on the left side of the face.  We will evaluate for a      cerebrovascular accident with an MRI of the head.  We will get 2-D      echocardiogram, carotid Dopplers and transcranial Dopplers as well.      We will continue statin.  We will not be very aggressive with her      blood pressure given the risk of hypoperfusion.  2. Tightness in the chest.  The patient has been working hard for the      past 3-4 days.  She did not have any chest pain, very atypical, but      does have the risk factors of hypertension and      hypercholesterolemia.  We will cycle cardiac enzymes and repeat the      EKG.  3. Atrial  fibrillation.  We will continue diltiazem.  Continue      anticoagulation with Coumadin.  4. Generalized anxiety disorder.  She is on lorazepam at home.  We      will continue lorazepam as needed.  5. Hypothyroidism.  We will check a TSH.  Continue Synthroid.  6. Vertigo.  Continue meclizine.  7. Asthma.  Continue Advair Diskus.  8. Deep venous thrombosis prophylaxis with sequential compression      devices.  9. Fluid/Electrolyte/Nutrition:  She will be made n.p.o. until she      passes a swallow evaluation.  We will replace electrolytes as      needed.  She will be started on normal saline at 75 mL an hour.  10.Disposition:  We will admit the patient to the neuro floor for      evaluation for a cerebrovascular accident.      Lucile Crater, MD  Electronically Signed     TA/MEDQ  D:  01/20/2009  T:  01/20/2009  Job:  [id number]

## 2010-11-14 NOTE — Discharge Summary (Signed)
NAMEJAIDIN, Stephanie Cortez               ACCOUNT NO.:  [id number]   MEDICAL RECORD NO.:  [id number]          PATIENT TYPE:  INP   LOCATION:  5507                         FACILITY:  [facility]   PHYSICIAN:  Altha Harm, MDDATE OF BIRTH:  [birth year]   DATE OF ADMISSION:  01/19/2009  DATE OF DISCHARGE:  01/22/2009                               DISCHARGE SUMMARY   DISCHARGE DISPOSITION:  Home.   FINAL DISCHARGE DIAGNOSES:  1. Left-sided dysesthesias.  2. Paroxysmal atrial fibrillation with rapid ventricular response.  3. History of vertigo.  4. Chronically elevated CK and CK-MB without elevation in troponins.  5. Hypothyroidism.  6. Hyperlipidemia.  7. Chest tightness which is completely resolved.  8. Anemia.   DISCHARGE MEDICATIONS:  Include the following:  1. Tiazac 100 mg p.o. daily.  2. Benicar 40 mg p.o. daily.  3. Coumadin 4 mg p.o. Monday and 2 mg p.o. on Tuesday through Sunday.  4. Meclizine 25 mg p.o. t.i.d. p.r.n.  5. Levothyroxine 50 mcg p.o. daily.  6. Feosol 1 tablet p.o. t.i.d.  7. Pravastatin 20 mg p.o. daily.  8. Advair 250/50, one puff inhaled b.i.d.  9. Astepro nasal spray, 2 sprays each nostril 2 times a day.   CONSULTANTS:  None.   PROCEDURES:  None.   DIAGNOSTIC STUDIES:  MRA, MRI of the brain which showed no acute  intracranial abnormality.  Next impression:  Scattered primarily  subcortical T2 hyperintensities bilaterally greater than expected for  age.  Finding is nonspecific and it can be seen in the setting of  chronic microvascular ischemia, a demyelinating process such as multiple  sclerosis, vasculitis, complicated migraine headaches or a sequelae of a  prior infection or inflammatory process.  Next impression:  Minimal  mastoid fluid without obstruction lesion. Next impression:  MRA shows  normal variant circle of Willis without evidence for significant  proximal stenosis, aneurysm or branch vessel occlusion.   CT of the head without contrast  shows subtle possible area of  hypoattenuation within the anterior aspect of the right MCA  distribution.   Portable chest x-ray done on the 21st which shows severe emphysema and  cardiomegaly without failure.  7 mm left midlung peripheral pulmonary  nodule.  Follow-up PA and lateral with monitoring leads was recommended  after acute illness.   Carotid duplex which shows mild calcific plaque in the proximal IC in  the right.  Mild calcific plaque at the ECA origin on the left.  No  significant ICA stenosis noted bilaterally.  Right vertebral artery not  found, left vertebral artery flow antegrade.   A 2-D echocardiogram performed, results are still pending at this time.   CODE STATUS:  Full code.   ALLERGIES:  To PENICILLIN.   PRIMARY CARE PHYSICIAN:  Dr. Clelia Croft.   PRIMARY CARDIOLOGIST:  Dr. Graciela Husbands.   CHIEF COMPLAINT:  Shortness of breath with pressure and tingling on the  left side of face.   HISTORY OF PRESENT ILLNESS:  Please see the H&P dictated by Dr. Loren Racer at  [place]? for details of the HPI.   HOSPITAL COURSE:  The patient was admitted  with a preliminary diagnosis  of a TIA.  However, in further interview, the most significant symptoms  the patient had was tachycardic pounding of the heart associated with  lightheadedness and some numbness on the left side.  The patient really  states that the numbness and tingling on the left side were that not  significant, that she was most impressed by the palpitations that she  was feeling on her heart.  On the monitor, the patient was shown to be  in atrial fibrillation with a rapid ventricular response.  She was  resumed on her Tiazac and her Benicar.  With this, the heart rate came  down into the 50s and 60s and the patient has been able to maintain her  blood pressure.  She is ambulating and she has had no further  palpitations and her symptoms are completely resolved.  I do believe  that the patient's problem was likely secondary  to atrial fibrillation  with rapid ventricular response.  In addition, the patient did admit  after the fact that she had missed a few doses of her Tiazac as she has  been engrossed in her moved and has not been timely with taking her  Tiazac.   Questionable TIA.  The patient upon admission was being ruled out for a  TIA.  Findings on the MRI and CT scan do not support a CVA.  There were  some abnormal findings on the MRI which should be followed up within 6  months or if the patient has any other neurological symptoms.  I will  defer those to the primary care physician.   Hypertension.  The patient's blood pressures were well controlled  without the use of her hydralazine.  It is quite possible that the  patient did have some periods of hypotension which might have lead to  her dizziness.  I have resumed her Tiazac and Benicar, however, I have  discontinued her hydralazine at this time.  Otherwise, the patient has  remained stable.  She is ambulatory without any problems.  Of note, the  patient did have a finding on the chest x-ray in the form of a 7 mm  peripheral pulmonary nodule.  Recommendation is that the patient has a  two-view chest x-ray to follow this in the convalescent period and I  will defer that her primary care physician.  Otherwise, the patient has  remained stable and is being discharged home today in stable condition.   Persistently elevated CK with CK-MBs.  However, her troponins have  normal throughout all evaluations.   Chronic anticoagulation.  The patient has been therapeutic throughout  her hospital stay on her Coumadin.  She is to follow up with Dr. Clelia Croft,  her primary care physician, for continuation of her Coumadin.  She  should have her INR checked on Monday.   FOLLOW UP:  The patient will follow up with her primary care physician,  Dr. Clelia Croft, within 1 week, and to follow up with her cardiologist as  needed.   DIETARY RESTRICTIONS:  The patient should be  on a heart-healthy diet.   PHYSICAL RESTRICTIONS:  None.   Total time of this discharge process is 1 hour.      Altha Harm, MD  Electronically Signed     MAM/MEDQ  D:  01/22/2009  T:  01/22/2009  Job:  [id number]   cc:   Duke Salvia, MD, [facility]  Dr. Clelia Croft

## 2010-11-23 ENCOUNTER — Ambulatory Visit (INDEPENDENT_AMBULATORY_CARE_PROVIDER_SITE_OTHER): Payer: BC Managed Care – PPO | Admitting: Family Medicine

## 2010-11-23 ENCOUNTER — Encounter: Payer: Self-pay | Admitting: Family Medicine

## 2010-11-23 VITALS — BP 118/64 | HR 76 | Ht 66.5 in | Wt 187.0 lb

## 2010-11-23 DIAGNOSIS — I10 Essential (primary) hypertension: Secondary | ICD-10-CM

## 2010-11-23 DIAGNOSIS — E039 Hypothyroidism, unspecified: Secondary | ICD-10-CM | POA: Insufficient documentation

## 2010-11-23 DIAGNOSIS — D509 Iron deficiency anemia, unspecified: Secondary | ICD-10-CM

## 2010-11-23 DIAGNOSIS — I4891 Unspecified atrial fibrillation: Secondary | ICD-10-CM

## 2010-11-23 DIAGNOSIS — M79673 Pain in unspecified foot: Secondary | ICD-10-CM

## 2010-11-23 DIAGNOSIS — M79609 Pain in unspecified limb: Secondary | ICD-10-CM

## 2010-11-23 DIAGNOSIS — IMO0002 Reserved for concepts with insufficient information to code with codable children: Secondary | ICD-10-CM

## 2010-11-23 NOTE — Patient Instructions (Signed)
Hypothyroidism The thyroid is a large gland located in the lower front of your neck. The thyroid gland helps control metabolism. Metabolism is how your body handles food. It controls metabolism with the hormone thyroxine. When this gland is underactive (hypothyroid), it produces too little hormone.  SYMPTOMS OF HYPOTHYROIDISM  Lethargy (feeling as though you have no energy)   Cold intolerance   Weight gain (in spite of normal food intake)   Dry skin   Coarse hair   Menstrual irregularity (if severe, may lead to infertility)   Slowing of thought processes  Cardiac problems are also caused by insufficient amounts of thyroid hormone. Hypothyroidism in the newborn is cretinism, and is an extreme form. It is important that this form be treated adequately and immediately or it will lead rapidly to retarded physical and mental development. CAUSES OF HYPOTHYROIDISM These include:   Absence or destruction of thyroid tissue.  Goiter due to iodine deficiency.   Goiter due to medications.   Congenital defects (since birth).  Problems with the pituitary. This causes a lack of TSH (thyroid stimulating hormone). This hormone tells the thyroid to turn out more hormone.   DIAGNOSIS To prove hypothyroidism, your caregiver may do blood tests and ultrasound tests. Sometimes the signs are hidden. It may be necessary for your caregiver to watch this illness with blood tests either before or after diagnosis and treatment. TREATMENT  Low levels of thyroid hormone are increased by using synthetic thyroid hormone. This is a safe, effective treatment. It usually takes about four weeks to gain the full effects of the medication. After you have the full effect of the medication, it will generally take another four weeks for problems to leave. Your caregiver may start you on low doses. If you have had heart problems the dose may be gradually increased. It is generally not an emergency to get rapidly to  normal. HOME CARE INSTRUCTIONS  Take your medications as your caregiver suggests. Let your caregiver know of any medications you are taking or start taking. Your caregiver will help you with dosage schedules.   As your condition improves, your dosage needs may increase. It will be necessary to have continuing blood tests as suggested by your caregiver.   Report all suspected medication side effects to your caregiver.  SEEK MEDICAL CARE IF YOU DEVELOP:  Sweating.  Tremulousness (tremors).   Anxiety.   Rapid weight loss.   Heat intolerance.  Emotional swings.   Diarrhea.   Weakness.   SEEK IMMEDIATE MEDICAL CARE IF: You develop chest pain, an irregular heart beat (palpitations), or a rapid heart beat. MAKE SURE YOU:   Understand these instructions.   Will watch your condition.   Will get help right away if you are not doing well or get worse.  Document Released: 06/18/2005 Document Re-Released: 05/31/2008 [employer] Patient Information 2011 [employer]. 

## 2010-11-23 NOTE — Assessment & Plan Note (Signed)
Per pulm 

## 2010-11-23 NOTE — Assessment & Plan Note (Signed)
Refer to ortho.

## 2010-11-23 NOTE — Progress Notes (Signed)
  Subjective:    Patient ID: Stephanie Cortez, female    DOB: [birth year], 77 y.o.   MRN: [id number]  HPI Pt here to establish.  She has hx of a - fib , bronchiectasis and sees Dr Graciela Husbands and Dr Marchelle Gearing.  Pt has no complaints.  Pt also sees Dr Noel Gerold for back pain and is doing better.      Review of Systems As above    Objective:   Physical Exam  Constitutional: She is oriented to person, place, and time. She appears well-developed and well-nourished.  Cardiovascular:       Irreg, irreg  Pulmonary/Chest: Effort normal and breath sounds normal. No respiratory distress. She has no wheezes.  Musculoskeletal: She exhibits no edema.  Neurological: She is alert and oriented to person, place, and time.  Psychiatric: She has a normal mood and affect. Her behavior is normal.          Assessment & Plan:

## 2010-11-23 NOTE — Assessment & Plan Note (Signed)
con't meds  Check labs 

## 2010-11-23 NOTE — Assessment & Plan Note (Signed)
Check labs 

## 2010-11-23 NOTE — Progress Notes (Signed)
  Subjective:    Patient ID: Stephanie Cortez, female    DOB: 12-29-1933, 77 y.o.   MRN: [id number]  HPI    Review of Systems     Objective:   Physical Exam    + pain in R foot---  Metatarsals , no swelling or errythema    Assessment & Plan:

## 2010-11-23 NOTE — Assessment & Plan Note (Signed)
Per cardio 

## 2010-11-23 NOTE — Assessment & Plan Note (Signed)
con't meds Stable Check labs  

## 2010-11-29 ENCOUNTER — Encounter: Payer: BC Managed Care – PPO | Admitting: *Deleted

## 2010-11-30 ENCOUNTER — Other Ambulatory Visit (INDEPENDENT_AMBULATORY_CARE_PROVIDER_SITE_OTHER): Payer: BC Managed Care – PPO

## 2010-11-30 DIAGNOSIS — IMO0002 Reserved for concepts with insufficient information to code with codable children: Secondary | ICD-10-CM

## 2010-11-30 DIAGNOSIS — I10 Essential (primary) hypertension: Secondary | ICD-10-CM

## 2010-11-30 DIAGNOSIS — Z0000 Encounter for general adult medical examination without abnormal findings: Secondary | ICD-10-CM

## 2010-11-30 DIAGNOSIS — I4891 Unspecified atrial fibrillation: Secondary | ICD-10-CM

## 2010-11-30 DIAGNOSIS — E039 Hypothyroidism, unspecified: Secondary | ICD-10-CM

## 2010-11-30 LAB — LIPID PANEL
Cholesterol: 148 mg/dL (ref 0–200)
HDL: 70 mg/dL (ref >39.00)
LDL Cholesterol: 60 mg/dL (ref 0–99)
Total CHOL/HDL Ratio: 2
Triglycerides: 88 mg/dL (ref 0.0–149.0)
VLDL: 17.6 mg/dL (ref 0.0–40.0)

## 2010-11-30 LAB — HEPATIC FUNCTION PANEL
ALT: 20 U/L (ref 0–35)
AST: 27 U/L (ref 0–37)
Albumin: 3.7 g/dL (ref 3.5–5.2)
Alkaline Phosphatase: 78 U/L (ref 39–117)
Bilirubin, Direct: 0.2 mg/dL (ref 0.0–0.3)
Total Bilirubin: 1.1 mg/dL (ref 0.3–1.2)
Total Protein: 5.8 g/dL — ABNORMAL LOW (ref 6.0–8.3)

## 2010-11-30 LAB — CBC WITH DIFFERENTIAL/PLATELET
Basophils Absolute: 0 10*3/uL (ref 0.0–0.1)
Basophils Relative: 0.5 % (ref 0.0–3.0)
Eosinophils Absolute: 0.1 10*3/uL (ref 0.0–0.7)
Eosinophils Relative: 2.7 % (ref 0.0–5.0)
HCT: 37.1 % (ref 36.0–46.0)
Hemoglobin: 12.5 g/dL (ref 12.0–15.0)
Lymphocytes Relative: 36.4 % (ref 12.0–46.0)
Lymphs Abs: 1.8 10*3/uL (ref 0.7–4.0)
MCHC: 33.8 g/dL (ref 30.0–36.0)
MCV: 96.4 fl (ref 78.0–100.0)
Monocytes Absolute: 0.6 10*3/uL (ref 0.1–1.0)
Monocytes Relative: 12.2 % — ABNORMAL HIGH (ref 3.0–12.0)
Neutro Abs: 2.3 10*3/uL (ref 1.4–7.7)
Neutrophils Relative %: 48.2 % (ref 43.0–77.0)
Platelets: 243 10*3/uL (ref 150.0–400.0)
RBC: 3.85 Mil/uL — ABNORMAL LOW (ref 3.87–5.11)
RDW: 13.3 % (ref 11.5–14.6)
WBC: 4.8 10*3/uL (ref 4.5–10.5)

## 2010-11-30 LAB — BASIC METABOLIC PANEL
BUN: 11 mg/dL (ref 6–23)
CO2: 27 mEq/L (ref 19–32)
Calcium: 9.7 mg/dL (ref 8.4–10.5)
Chloride: 101 mEq/L (ref 96–112)
Creatinine, Ser: 0.7 mg/dL (ref 0.4–1.2)
GFR: 86.62 mL/min (ref >60.00)
Glucose, Bld: 92 mg/dL (ref 70–99)
Potassium: 4.8 mEq/L (ref 3.5–5.1)
Sodium: 133 mEq/L — ABNORMAL LOW (ref 135–145)

## 2010-11-30 LAB — TSH: TSH: 1.89 u[IU]/mL (ref 0.35–5.50)

## 2010-12-04 ENCOUNTER — Ambulatory Visit: Payer: Self-pay | Admitting: Internal Medicine

## 2010-12-05 NOTE — Progress Notes (Signed)
Labs only

## 2010-12-14 ENCOUNTER — Ambulatory Visit (INDEPENDENT_AMBULATORY_CARE_PROVIDER_SITE_OTHER): Payer: BC Managed Care – PPO | Admitting: *Deleted

## 2010-12-14 DIAGNOSIS — I4891 Unspecified atrial fibrillation: Secondary | ICD-10-CM

## 2010-12-14 LAB — POCT INR: INR: 3.4

## 2010-12-19 ENCOUNTER — Ambulatory Visit: Payer: Self-pay | Admitting: Internal Medicine

## 2010-12-27 ENCOUNTER — Other Ambulatory Visit: Payer: Self-pay | Admitting: Orthopaedic Surgery

## 2010-12-27 DIAGNOSIS — M545 Low back pain, unspecified: Secondary | ICD-10-CM

## 2010-12-27 DIAGNOSIS — M79604 Pain in right leg: Secondary | ICD-10-CM

## 2010-12-28 ENCOUNTER — Ambulatory Visit (INDEPENDENT_AMBULATORY_CARE_PROVIDER_SITE_OTHER): Payer: BC Managed Care – PPO | Admitting: *Deleted

## 2010-12-28 DIAGNOSIS — I4891 Unspecified atrial fibrillation: Secondary | ICD-10-CM

## 2010-12-28 LAB — POCT INR: INR: 2.3

## 2011-01-08 ENCOUNTER — Ambulatory Visit
Admission: RE | Admit: 2011-01-08 | Discharge: 2011-01-08 | Disposition: A | Discharge status: Home / Self Care | Payer: BC Managed Care – PPO | Source: Ambulatory Visit | Attending: Orthopaedic Surgery | Admitting: Orthopaedic Surgery

## 2011-01-08 DIAGNOSIS — M79604 Pain in right leg: Secondary | ICD-10-CM

## 2011-01-08 DIAGNOSIS — M545 Low back pain, unspecified: Secondary | ICD-10-CM

## 2011-01-10 ENCOUNTER — Telehealth: Payer: Self-pay | Admitting: Internal Medicine

## 2011-01-10 NOTE — Telephone Encounter (Signed)
I spoke with the patient. I explained I have not seen any paperwork on her from Dr. Lestine Box office. She states she brought this to the office herself. She has a copy and will bring another to me this afternoon. She states she is scheduled for a Right Gastroc Slide procedure due to pain on the bottom of her foot. This will be done outpatient and is to release the muscle in the her calf. She is on coumadin for a-fib with no history of stroke. I asked her about anesthesia and she said anesthesia is listed as "choice." I will review with Dr. Graciela Husbands and let her know about clearance and coumadin. She is agreeable.

## 2011-01-10 NOTE — Telephone Encounter (Signed)
Reviewed with Dr. Graciela Husbands. Patient is ok for surgery and may hold coumadin for 5 days prior. I have advised the patient of this and will fax clearance to Dr. Lestine Box office.

## 2011-01-10 NOTE — Telephone Encounter (Signed)
Patient checking on status of paperwork that need to be sign faxed back to Dr. Fonnie Jarvis office. [phone] - medical clearance. Coumadin. Surgery is pending.

## 2011-01-19 ENCOUNTER — Other Ambulatory Visit: Payer: Self-pay | Admitting: Internal Medicine

## 2011-01-25 ENCOUNTER — Ambulatory Visit (INDEPENDENT_AMBULATORY_CARE_PROVIDER_SITE_OTHER): Payer: BC Managed Care – PPO | Admitting: *Deleted

## 2011-01-25 DIAGNOSIS — I4891 Unspecified atrial fibrillation: Secondary | ICD-10-CM

## 2011-01-25 LAB — POCT INR: INR: 3.1

## 2011-01-29 ENCOUNTER — Ambulatory Visit (INDEPENDENT_AMBULATORY_CARE_PROVIDER_SITE_OTHER): Payer: BC Managed Care – PPO | Admitting: Internal Medicine

## 2011-01-29 DIAGNOSIS — J479 Bronchiectasis, uncomplicated: Secondary | ICD-10-CM

## 2011-02-10 ENCOUNTER — Other Ambulatory Visit: Payer: Self-pay | Admitting: Family Medicine

## 2011-02-16 NOTE — Progress Notes (Signed)
  Subjective:    Patient ID: Stephanie Cortez, female    DOB: January 22, 1934, 77 y.o.   MRN: [id number]  HPI Error, she canceled visit   Review of Systems     Objective:   Physical Exam        Assessment & Plan:

## 2011-02-22 ENCOUNTER — Ambulatory Visit (INDEPENDENT_AMBULATORY_CARE_PROVIDER_SITE_OTHER): Payer: BC Managed Care – PPO | Admitting: *Deleted

## 2011-02-22 DIAGNOSIS — I4891 Unspecified atrial fibrillation: Secondary | ICD-10-CM

## 2011-02-22 LAB — POCT INR: INR: 3.4

## 2011-02-22 NOTE — Patient Instructions (Signed)
Last dose of Coumadin 03/08/11, hold 03/09/11 until surgery on 03/14/11.  Resume Coumadin 9/12 if OK with MD take1 tablet the first day, then resume same dosage 1 tablet daily except 1/2 tablet on Mondays, Wednesdays, and Fridays.

## 2011-03-10 ENCOUNTER — Other Ambulatory Visit: Payer: Self-pay | Admitting: Internal Medicine

## 2011-03-11 ENCOUNTER — Other Ambulatory Visit: Payer: Self-pay | Admitting: Family Medicine

## 2011-03-12 ENCOUNTER — Encounter (HOSPITAL_BASED_OUTPATIENT_CLINIC_OR_DEPARTMENT_OTHER)
Admission: RE | Admit: 2011-03-12 | Discharge: 2011-03-12 | Disposition: A | Discharge status: Home / Self Care | Payer: BC Managed Care – PPO | Source: Ambulatory Visit | Attending: Orthopedic Surgery | Admitting: Orthopedic Surgery

## 2011-03-12 ENCOUNTER — Other Ambulatory Visit: Payer: Self-pay | Admitting: Orthopedic Surgery

## 2011-03-12 ENCOUNTER — Ambulatory Visit
Admission: RE | Admit: 2011-03-12 | Discharge: 2011-03-12 | Disposition: A | Discharge status: Home / Self Care | Payer: BC Managed Care – PPO | Source: Ambulatory Visit | Attending: Orthopedic Surgery | Admitting: Orthopedic Surgery

## 2011-03-12 ENCOUNTER — Other Ambulatory Visit: Payer: Self-pay | Admitting: Family Medicine

## 2011-03-12 ENCOUNTER — Other Ambulatory Visit: Payer: Self-pay

## 2011-03-12 DIAGNOSIS — Z1231 Encounter for screening mammogram for malignant neoplasm of breast: Secondary | ICD-10-CM

## 2011-03-12 DIAGNOSIS — Z01811 Encounter for preprocedural respiratory examination: Secondary | ICD-10-CM

## 2011-03-12 LAB — BASIC METABOLIC PANEL
BUN: 12 mg/dL (ref 6–23)
CO2: 25 mEq/L (ref 19–32)
Calcium: 9.9 mg/dL (ref 8.4–10.5)
Chloride: 97 mEq/L (ref 96–112)
Creatinine, Ser: 0.65 mg/dL (ref 0.50–1.10)
GFR calc Af Amer: >60 mL/min (ref >60)
GFR calc non Af Amer: >60 mL/min (ref >60)
Glucose, Bld: 102 mg/dL — ABNORMAL HIGH (ref 70–99)
Potassium: 4.7 mEq/L (ref 3.5–5.1)
Sodium: 131 mEq/L — ABNORMAL LOW (ref 135–145)

## 2011-03-12 MED ORDER — MECLIZINE HCL 25 MG PO TABS: 25.0000 mg | ORAL_TABLET | Freq: Three times a day (TID) | ORAL | Status: DC | PRN

## 2011-03-12 MED ORDER — PRAVASTATIN SODIUM 20 MG PO TABS: 20.0000 mg | ORAL_TABLET | Freq: Every day | ORAL | Status: DC

## 2011-03-12 MED ORDER — LEVOTHYROXINE SODIUM 50 MCG PO TABS: 50.0000 ug | ORAL_TABLET | Freq: Every day | ORAL | Status: DC

## 2011-03-12 MED ORDER — OLMESARTAN MEDOXOMIL 40 MG PO TABS: 40.0000 mg | ORAL_TABLET | Freq: Every day | ORAL | Status: DC

## 2011-03-12 MED ORDER — LORAZEPAM 0.5 MG PO TABS: 0.5000 mg | ORAL_TABLET | Freq: Three times a day (TID) | ORAL | Status: DC

## 2011-03-12 NOTE — Telephone Encounter (Signed)
Call from patient Stephanie Cortez in may and Labs are current and good for 6 mos request Rx be faxed to the pharmacy [facility] on [address]--- Patient requested Ativan last seen 11/23/10 and never filled here. Appt scheduled or 05/02/11 please advise.    KP

## 2011-03-12 NOTE — Telephone Encounter (Signed)
Faxed.   KP 

## 2011-03-14 ENCOUNTER — Ambulatory Visit (HOSPITAL_BASED_OUTPATIENT_CLINIC_OR_DEPARTMENT_OTHER)
Admission: RE | Admit: 2011-03-14 | Discharge: 2011-03-14 | Disposition: A | Discharge status: Home / Self Care | Payer: BC Managed Care – PPO | Source: Ambulatory Visit | Attending: Orthopedic Surgery | Admitting: Orthopedic Surgery

## 2011-03-14 ENCOUNTER — Encounter (HOSPITAL_BASED_OUTPATIENT_CLINIC_OR_DEPARTMENT_OTHER): Payer: BC Managed Care – PPO | Attending: Orthopedic Surgery

## 2011-03-14 DIAGNOSIS — R197 Diarrhea, unspecified: Secondary | ICD-10-CM | POA: Insufficient documentation

## 2011-03-14 DIAGNOSIS — I509 Heart failure, unspecified: Secondary | ICD-10-CM | POA: Insufficient documentation

## 2011-03-14 DIAGNOSIS — I4891 Unspecified atrial fibrillation: Secondary | ICD-10-CM | POA: Insufficient documentation

## 2011-03-14 DIAGNOSIS — Z01818 Encounter for other preprocedural examination: Secondary | ICD-10-CM | POA: Insufficient documentation

## 2011-03-14 DIAGNOSIS — G609 Hereditary and idiopathic neuropathy, unspecified: Secondary | ICD-10-CM | POA: Insufficient documentation

## 2011-03-14 DIAGNOSIS — Z01812 Encounter for preprocedural laboratory examination: Secondary | ICD-10-CM | POA: Insufficient documentation

## 2011-03-14 HISTORY — PX: FOOT SURGERY: SHX648

## 2011-03-14 LAB — PROTIME-INR
INR: 1.16 (ref 0.00–1.49)
Prothrombin Time: 15 seconds (ref 11.6–15.2)

## 2011-03-14 LAB — APTT: aPTT: 34 seconds (ref 24–37)

## 2011-03-15 LAB — POCT HEMOGLOBIN-HEMACUE: Hemoglobin: 13.4 g/dL (ref 12.0–15.0)

## 2011-03-15 NOTE — Op Note (Signed)
  [name], [name] NO.:  [id number]  MEDICAL RECORD NO.:  [id number]  LOCATION:                                 FACILITY:  PHYSICIAN:  Leonides Grills, M.D.          DATE OF BIRTH:  DATE OF PROCEDURE:  03/14/2011 DATE OF DISCHARGE:                              OPERATIVE REPORT   PREOPERATIVE DIAGNOSIS:  Right tight gastroc.  POSTOPERATIVE DIAGNOSIS:  Right tight gastroc.  OPERATION:  Right gastroc slide.  ANESTHESIA:  General.  SURGEON:  Leonides Grills, MD  ASSISTANT:  Rexene Edison, PA-C  ESTIMATED BLOOD LOSS:  Minimal.  TOURNIQUET:  None.  COMPLICATIONS:  None.  DISPOSITION:  Stable to PR.  INDICATIONS:  This is a 77 year old female who has had right forefoot pain due to the above pathology as well as second hammertoe with slight digitalization of fat pad and second MTP joint synovitis.  Instead of undergoing an extensive forefoot reconstructing, we elected to perform a gastroc slide to see if this would help with her forefoot pain.  She was consented of the above procedure.  All risks of infection, vessel injury, persistent pain, worse pain, prolonged recovery, wound healing problems, DVT, PE and possibility that was still may need to do a forefoot reconstruction if her pain is no better were all explained, questions were encouraged and answered.  OPERATION:  The patient was brought to the operating room, placed in supine position after adequate general anesthesia was administered as well as Ancef 1 g IV piggyback.  The right lower extremity was prepped and draped in sterile manner proximally thigh tourniquet.  We started procedure with a longitudinal incision on medial aspect of gastrocnemius muscle tendinous junction.  Dissection was carried down through skin. Hemostasis was obtained.  Fascia was opened in line with the incision. Conjoined region was then developed between gastroc and soleus.  Soft tissue was elevated off posterior aspect of  the gastrocnemius.  Sural nerves identified and protected posteriorly throughout the case. Gastrocnemius then released with curved Mayo scissors.  This had excellent release tight gastroc.  The area was copiously irrigated with normal saline.  Subcu was closed with 3-0 Vicryl.  Skin was closed with 4-0 Monocryl subcuticular stitch.  Steri-Strips were applied.  Sterile dressing was applied.  CAM walker boot was applied.  The patient was stable to PR.     Leonides Grills, M.D.     PB/MEDQ  D:  03/14/2011  T:  03/14/2011  Job:  [id number]  Electronically Signed by Leonides Grills M.D. on 03/15/2011 06:42:43 PM

## 2011-03-22 ENCOUNTER — Ambulatory Visit (INDEPENDENT_AMBULATORY_CARE_PROVIDER_SITE_OTHER): Payer: BC Managed Care – PPO | Admitting: *Deleted

## 2011-03-22 DIAGNOSIS — I4891 Unspecified atrial fibrillation: Secondary | ICD-10-CM

## 2011-03-22 LAB — POCT INR: INR: 1.5

## 2011-04-03 ENCOUNTER — Ambulatory Visit (INDEPENDENT_AMBULATORY_CARE_PROVIDER_SITE_OTHER): Payer: BC Managed Care – PPO | Admitting: Internal Medicine

## 2011-04-03 ENCOUNTER — Ambulatory Visit (INDEPENDENT_AMBULATORY_CARE_PROVIDER_SITE_OTHER): Payer: BC Managed Care – PPO | Admitting: *Deleted

## 2011-04-03 ENCOUNTER — Encounter: Payer: Self-pay | Admitting: Internal Medicine

## 2011-04-03 VITALS — BP 142/74 | HR 96 | Ht 67.0 in | Wt 191.0 lb

## 2011-04-03 DIAGNOSIS — I4891 Unspecified atrial fibrillation: Secondary | ICD-10-CM

## 2011-04-03 LAB — POCT INR: INR: 2.5

## 2011-04-03 NOTE — Progress Notes (Signed)
HPI  Stephanie Cortez is a 77 y.o. female  seen in followup for long-standing history of atrial fibrillation dating back about one decade. She currently is in atrial fibrillation that is permanent. She has failed multiple cardioversions and anti arrhythmic drug trials with flecainide, amiodarone, and sotalol. She has been left in atrial fibrillation and currently she is largely asymptomatic.   In the spring she was having problems with palpitations which she has not had in many years as as well as peripheral edema This too has never occurred previously. It has resolved spontaneously.   He has undergone recent gastroc slide surgery. She is concerned about possible infection. She's been put on antibiotics by her orthopedist. Her sister is coming to visit and this has been quite stressful. As her sisters also been quite ill.  Her cardiac evaluation in the past has included an echo in 2005 notable for mitral regurgitation and tricuspid regurgitation both of which were rated as moderate. In 2010 She had left atrial enlargement and normal left ventricular function. Multiple Myoview scans have demonstrated normal left ventricular function and no ischemia.   Past Medical History  Diagnosis Date  . Bronchiectasis     >PFT 07/13/2008 in [place]  Fev 1.9L/76%, FVC 2.45L/74, Ratio 79, TLC 121%, DLCO 64%  AE BRonchiectasis - Dec 2010.New Rx:  outpatient - Feb 2011 - Rx outpatient  . Hypothyroidism   . Dyslipidemia   . Eczema   . Anemia     - Hgb 9.7gm% on 07/13/2008 in [place] -  Hgg 129gm% wiht normal irone levsl and ferritin 10/27/2008 in [place] Recurrent otitis/sinusitis  . Atrial fibrillation   . Glaucoma   . Back pain 2009    Past Surgical History  Procedure Date  . Cataract extraction   . Foot surgery   . Total abdominal hysterectomy     Current Outpatient Prescriptions  Medication Sig Dispense Refill  . budesonide-formoterol (SYMBICORT) 80-4.5 MCG/ACT inhaler Inhale 2 puffs into the lungs 2  (two) times daily.        [name] Kitchen doxycycline (DORYX) 100 MG DR capsule Take 100 mg by mouth daily.        [name] Kitchen levothyroxine (SYNTHROID) 50 MCG tablet Take 1 tablet (50 mcg total) by mouth daily.  30 tablet  2  . LORazepam (ATIVAN) 0.5 MG tablet Take 1 tablet (0.5 mg total) by mouth every 8 (eight) hours.  30 tablet  0  . meclizine (ANTIVERT) 25 MG tablet Take 1 tablet (25 mg total) by mouth 3 (three) times daily as needed.  30 tablet  2  . Multiple Vitamin (MULTIVITAMIN) capsule Take 1 capsule by mouth daily.        [name] Kitchen olmesartan (BENICAR) 40 MG tablet Take 1 tablet (40 mg total) by mouth daily.  30 tablet  2  . omeprazole (PRILOSEC) 20 MG capsule Take 20 mg by mouth daily.        . Potassium (POTASSIMIN) 75 MG TABS 1 tab po qd       . pravastatin (PRAVACHOL) 20 MG tablet Take 1 tablet (20 mg total) by mouth daily.  30 tablet  2  . TIAZAC 300 MG 24 hr capsule TAKE ONE CAPSULE BY MOUTH DAILY  30 capsule  2  . warfarin (COUMADIN) 2.5 MG tablet Take 1 tablet (2.5 mg total) by mouth daily.  30 tablet  11    Allergies  Allergen Reactions  . Moxifloxacin     REACTION: weakness  . Pantoprazole Sodium  REACTION: rash  . Penicillins     REACTION: rash    Review of Systems negative except from HPI and PMH  Physical Exam Well developed and well nourished in no acute distress HENT normal E scleral and icterus clear Neck Supple JVP flat; carotids brisk and full Clear to ausculation Irregularly irregular rate and rhythm, no murmurs gallops or rub Soft with active bowel sounds No clubbing cyanosis and edema Alert and oriented, grossly normal motor and sensory function Skin Warm and Dry  ECG atrial fibrillation at 96 Interval-/0.08/0.36 Axis is 32  Assessment and  Plan

## 2011-04-03 NOTE — Assessment & Plan Note (Signed)
Atrial fibrillation is somewhat rapid. She is to see Dr. Laury Axon this week. We will await the results of her TSH and hemoglobin to see if there is any primary cause as to why her heart is going faster.

## 2011-04-03 NOTE — Patient Instructions (Signed)
Your physician wants you to follow-up in: 1 year with Dr. Klein. You will receive a reminder letter in the mail two months in advance. If you don't receive a letter, please call our office to schedule the follow-up appointment.  Your physician recommends that you continue on your current medications as directed. Please refer to the Current Medication list given to you today.  

## 2011-04-05 ENCOUNTER — Encounter: Payer: BC Managed Care – PPO | Admitting: *Deleted

## 2011-04-06 ENCOUNTER — Encounter: Payer: Self-pay | Admitting: Family Medicine

## 2011-04-06 ENCOUNTER — Ambulatory Visit (INDEPENDENT_AMBULATORY_CARE_PROVIDER_SITE_OTHER): Payer: BC Managed Care – PPO | Admitting: Family Medicine

## 2011-04-06 VITALS — BP 138/82 | HR 79 | Temp 98.8°F | Wt 193.6 lb

## 2011-04-06 DIAGNOSIS — Z23 Encounter for immunization: Secondary | ICD-10-CM

## 2011-04-06 DIAGNOSIS — I4891 Unspecified atrial fibrillation: Secondary | ICD-10-CM

## 2011-04-06 DIAGNOSIS — E785 Hyperlipidemia, unspecified: Secondary | ICD-10-CM

## 2011-04-06 DIAGNOSIS — H8110 Benign paroxysmal vertigo, unspecified ear: Secondary | ICD-10-CM

## 2011-04-06 DIAGNOSIS — F419 Anxiety disorder, unspecified: Secondary | ICD-10-CM

## 2011-04-06 DIAGNOSIS — E039 Hypothyroidism, unspecified: Secondary | ICD-10-CM

## 2011-04-06 DIAGNOSIS — D509 Iron deficiency anemia, unspecified: Secondary | ICD-10-CM

## 2011-04-06 DIAGNOSIS — I10 Essential (primary) hypertension: Secondary | ICD-10-CM

## 2011-04-06 DIAGNOSIS — M48061 Spinal stenosis, lumbar region without neurogenic claudication: Secondary | ICD-10-CM

## 2011-04-06 LAB — POCT URINALYSIS DIPSTICK
Bilirubin, UA: NEGATIVE
Blood, UA: NEGATIVE
Glucose, UA: NEGATIVE
Ketones, UA: NEGATIVE
Leukocytes, UA: NEGATIVE
Nitrite, UA: NEGATIVE
Protein, UA: NEGATIVE
Spec Grav, UA: 1.02
Urobilinogen, UA: 0.2
pH, UA: 5

## 2011-04-06 LAB — HEPATIC FUNCTION PANEL
ALT: 24 U/L (ref 0–35)
AST: 27 U/L (ref 0–37)
Albumin: 3.9 g/dL (ref 3.5–5.2)
Alkaline Phosphatase: 82 U/L (ref 39–117)
Bilirubin, Direct: 0.1 mg/dL (ref 0.0–0.3)
Total Bilirubin: 0.9 mg/dL (ref 0.3–1.2)
Total Protein: 6.8 g/dL (ref 6.0–8.3)

## 2011-04-06 LAB — BASIC METABOLIC PANEL
BUN: 10 mg/dL (ref 6–23)
CO2: 27 mEq/L (ref 19–32)
Calcium: 9.7 mg/dL (ref 8.4–10.5)
Chloride: 99 mEq/L (ref 96–112)
Creatinine, Ser: 0.7 mg/dL (ref 0.4–1.2)
GFR: 91.03 mL/min (ref >60.00)
Glucose, Bld: 97 mg/dL (ref 70–99)
Potassium: 5.2 mEq/L — ABNORMAL HIGH (ref 3.5–5.1)
Sodium: 134 mEq/L — ABNORMAL LOW (ref 135–145)

## 2011-04-06 LAB — CBC WITH DIFFERENTIAL/PLATELET
Basophils Absolute: 0 10*3/uL (ref 0.0–0.1)
Basophils Relative: 0.7 % (ref 0.0–3.0)
Eosinophils Absolute: 0.1 10*3/uL (ref 0.0–0.7)
Eosinophils Relative: 1.3 % (ref 0.0–5.0)
HCT: 38.2 % (ref 36.0–46.0)
Hemoglobin: 12.6 g/dL (ref 12.0–15.0)
Lymphocytes Relative: 26.1 % (ref 12.0–46.0)
Lymphs Abs: 1.6 10*3/uL (ref 0.7–4.0)
MCHC: 33 g/dL (ref 30.0–36.0)
MCV: 95.1 fl (ref 78.0–100.0)
Monocytes Absolute: 0.5 10*3/uL (ref 0.1–1.0)
Monocytes Relative: 8.2 % (ref 3.0–12.0)
Neutro Abs: 3.9 10*3/uL (ref 1.4–7.7)
Neutrophils Relative %: 63.7 % (ref 43.0–77.0)
Platelets: 331 10*3/uL (ref 150.0–400.0)
RBC: 4.02 Mil/uL (ref 3.87–5.11)
RDW: 12.8 % (ref 11.5–14.6)
WBC: 6.1 10*3/uL (ref 4.5–10.5)

## 2011-04-06 LAB — LIPID PANEL
Cholesterol: 176 mg/dL (ref 0–200)
HDL: 79.1 mg/dL (ref >39.00)
LDL Cholesterol: 82 mg/dL (ref 0–99)
Total CHOL/HDL Ratio: 2
Triglycerides: 74 mg/dL (ref 0.0–149.0)
VLDL: 14.8 mg/dL (ref 0.0–40.0)

## 2011-04-06 LAB — HEMOGLOBIN A1C: Hgb A1c MFr Bld: 5.7 % (ref 4.6–6.5)

## 2011-04-06 MED ORDER — MECLIZINE HCL 25 MG PO TABS: 25.0000 mg | ORAL_TABLET | Freq: Three times a day (TID) | ORAL | Status: DC | PRN

## 2011-04-06 MED ORDER — LORAZEPAM 0.5 MG PO TABS: 0.5000 mg | ORAL_TABLET | Freq: Three times a day (TID) | ORAL | Status: DC

## 2011-04-06 MED ORDER — PRAVASTATIN SODIUM 20 MG PO TABS: 20.0000 mg | ORAL_TABLET | Freq: Every day | ORAL | Status: DC

## 2011-04-06 MED ORDER — SYNTHROID 50 MCG PO TABS: 50.0000 ug | ORAL_TABLET | Freq: Every day | ORAL | Status: DC

## 2011-04-06 NOTE — Progress Notes (Signed)
  Subjective:    Patient ID: Stephanie Cortez, female    DOB: [birth year], 77 y.o.   MRN: [id number]  HPI Pt here for f/u and labs.  She would also like a flu shot.  Pt will be having spinal surgery soon secondary to lumbar stenosis and bulging discs.  She would like a second opinion.   No complaints-- doing well after gastroc slide.    Review of Systems    as above Objective:   Physical Exam  Constitutional: She is oriented to person, place, and time. She appears well-nourished.  Musculoskeletal: She exhibits edema. She exhibits no tenderness.       R low ext --s/p surgery  Neurological: She is alert and oriented to person, place, and time.  Psychiatric: She has a normal mood and affect. Her behavior is normal.          Assessment & Plan:

## 2011-04-06 NOTE — Assessment & Plan Note (Signed)
Check labs today.

## 2011-04-06 NOTE — Patient Instructions (Signed)
Cholesterol Cholesterol is a white, waxy, fat-like protein needed by your body in small amounts. The liver makes all the cholesterol you need. It is carried from the liver by the blood through the blood vessels. Deposits (plaque) may build up on blood vessel walls. This makes the arteries narrower and stiffer. Plaque increases the risk for heart attack and stroke. You cannot feel your cholesterol level even if it is very high. The only way to know is by a blood test to check your lipid (fats) levels. Once you know your cholesterol levels, you should keep a record of the test results. Work with your caregiver to to keep your levels in the desired range. WHAT THE RESULTS MEAN:  Total cholesterol is a rough measure of all the cholesterol in your blood.   LDL is the so-called bad cholesterol. This is the type that deposits cholesterol   in the walls of the arteries. You want this level to be low.   HDL is the good cholesterol because it cleans the arteries and carries the LDL away. You want this level to be high.   Triglycerides are fat that the body can either burn for energy or store. High levels are closely linked to heart disease.  DESIRED LEVELS:  Total cholesterol below 200.   LDL below 100 for people at risk, below 70 for very high risk.   HDL above 50 is good, above 60 is best.   Triglycerides below 150.  HOW TO LOWER YOUR CHOLESTEROL:  Diet.   Choose fish or white meat chicken and turkey, roasted or baked. Limit fatty cuts of red meat, fried foods, and processed meats, such as sausage and lunch meat.   Eat lots of fresh fruits and vegetables. Choose whole grains, beans, pasta, potatoes and cereals.   Use only small amounts of olive, corn or canola oils. Avoid butter, mayonnaise, shortening or palm kernel oils. Avoid foods with trans-fats.   Use skim/nonfat milk and low-fat/nonfat yogurt and cheeses. Avoid whole milk, cream, ice cream, egg yolks and cheeses. Healthy desserts  include angel food cake, gingersnaps, animal crackers, hard candy, popsicles, and low-fat/nonfat frozen yogurt. Avoid pastries, cakes, pies and cookies.   Exercise.   A regular program helps decrease LDL and raises HDL.   Helps with weight control.   Do things that increase your activity level like gardening, walking, or taking the stairs.   Medication.   May be prescribed by your caregiver to help lowering cholesterol and the risk for heart disease.   You may need medicine even if your levels are normal if you have several risk factors.  HOME CARE INSTRUCTIONS  Follow your diet and exercise programs as suggested by your caregiver.   Take medications as directed.   Have blood work done when your caregiver feels it is necessary.  MAKE SURE YOU:   Understand these instructions.   Will watch your condition.   Will get help right away if you are not doing well or get worse.  Document Released: 03/13/2001 Document Re-Released: 05/31/2008 [employer] Patient Information 2011 [employer]. 

## 2011-04-06 NOTE — Assessment & Plan Note (Signed)
con't meds rto 3-6 months 

## 2011-04-06 NOTE — Assessment & Plan Note (Signed)
Per cardio 

## 2011-04-06 NOTE — Assessment & Plan Note (Signed)
con't meds Check labs today 

## 2011-04-08 ENCOUNTER — Other Ambulatory Visit: Payer: Self-pay | Admitting: Internal Medicine

## 2011-04-11 ENCOUNTER — Ambulatory Visit: Payer: BC Managed Care – PPO | Admitting: Internal Medicine

## 2011-04-12 ENCOUNTER — Encounter: Payer: Self-pay | Admitting: Internal Medicine

## 2011-04-12 ENCOUNTER — Ambulatory Visit (INDEPENDENT_AMBULATORY_CARE_PROVIDER_SITE_OTHER): Payer: BC Managed Care – PPO | Admitting: Internal Medicine

## 2011-04-12 DIAGNOSIS — J479 Bronchiectasis, uncomplicated: Secondary | ICD-10-CM

## 2011-04-12 NOTE — Progress Notes (Signed)
Subjective:    Patient ID: Stephanie Cortez, female    DOB: 1934-02-08, 77 y.o.   MRN: [id number]  HPI Foll[place] Bronchiectasis. Last o[place] Bronchiectasis. Last seen August 2010, Dec 2010, Jan 2011, FEb 2011 (AE bronciectasis), and  Oct 2011. S/p pulmonary rehab in summer 2011.  Since then had one more visit in Nov 2011 for  AE bronchiectasis. Since then doing well. Denies cough or dyspnea but feels chest is just a bit more tight than usual. . No other specific complaints.  Denies chest pain, dyspnea, orthopnea, hemoptysis, fever, n/v/d, edema, headache.  However, does admit to increased fatigue  + . Uses symbicort as needed which is around 2-3 times per week. Is requesting spirometry  REC Your breathing test is unchanged since 2010  So I am unclear why you are more tired  Please use Your symbicort daily  Please talk to Dr. Lenord Fellers about your tiredness  I will call you in several weeks with alpha 1 results  Return to see me in 6-9 months or sooner if there are problems   OV 04/12/11:  Using symbicort prn and overall doing well. Only has minimal wheeze. She is anticipatory and pro-acitve in care. When sinus congestion develops uses netti pot and lemon-honey vapors for relief. This prevents wheezing. Typically winter is when she has AE bronchiectasis. Wants to know if she should take prn symbicort or go on daily.   Past Med: has changed PMD to Dr Laury Axon.  Social: taking care of elder sister in her 24s who has now become dependent Fam hx: as in soccial. No other change    Review of Systems  Constitutional: Negative for fever and unexpected weight change.  HENT: Negative for ear pain, nosebleeds, congestion, sore throat, rhinorrhea, sneezing, trouble swallowing, dental problem, postnasal drip and sinus pressure.   Eyes: Negative for redness and itching.  Respiratory: Negative for cough, chest tightness, shortness of breath and wheezing.   Cardiovascular: Negative for palpitations and leg  swelling.  Gastrointestinal: Negative for nausea and vomiting.  Genitourinary: Negative for dysuria.  Musculoskeletal: Negative for joint swelling.  Skin: Negative for rash.  Neurological: Negative for headaches.  Hematological: Does not bruise/bleed easily.  Psychiatric/Behavioral: Negative for dysphoric mood. The patient is not nervous/anxious.        Objective:   Physical Exam  Vitals reviewed. Constitutional: She is oriented to person, place, and time. She appears well-developed and well-nourished. No distress.  HENT:  Head: Normocephalic and atraumatic.  Right Ear: External ear normal.  Left Ear: External ear normal.  Mouth/Throat: Oropharynx is clear and moist. No oropharyngeal exudate.  Eyes: Conjunctivae and EOM are normal. Pupils are equal, round, and reactive to light. Right eye exhibits no discharge. Left eye exhibits no discharge. No scleral icterus.  Neck: Normal range of motion. Neck supple. No JVD present. No tracheal deviation present. No thyromegaly present.  Cardiovascular: Normal rate, regular rhythm, normal heart sounds and intact distal pulses.  Exam reveals no gallop and no friction rub.   No murmur heard. Pulmonary/Chest: Effort normal and breath sounds normal. No respiratory distress. She has no wheezes. She has no rales. She exhibits no tenderness.  Abdominal: Soft. Bowel sounds are normal. She exhibits no distension and no mass. There is no tenderness. There is no rebound and no guarding.  Musculoskeletal: Normal range of motion. She exhibits no edema and no tenderness.  Lymphadenopathy:    She has no cervical adenopathy.  Neurological: She is alert and oriented to person, place, and  time. She has normal reflexes. No cranial nerve deficit. She exhibits normal muscle tone. Coordination normal.  Skin: Skin is warm and dry. No rash noted. She is not diaphoretic. No erythema. No pallor.  Psychiatric: She has a normal mood and affect. Her behavior is normal.  Judgment and thought content normal.          Assessment & Plan:

## 2011-04-12 NOTE — Patient Instructions (Signed)
Please use symbicort atleast once per day Congrats on flu shot Return in 6 months Spirometry test at return

## 2011-04-13 ENCOUNTER — Ambulatory Visit
Admission: RE | Admit: 2011-04-13 | Discharge: 2011-04-13 | Disposition: A | Discharge status: Home / Self Care | Payer: BC Managed Care – PPO | Source: Ambulatory Visit | Attending: Family Medicine | Admitting: Family Medicine

## 2011-04-13 DIAGNOSIS — Z1231 Encounter for screening mammogram for malignant neoplasm of breast: Secondary | ICD-10-CM

## 2011-04-16 ENCOUNTER — Other Ambulatory Visit: Payer: Self-pay | Admitting: Internal Medicine

## 2011-04-16 ENCOUNTER — Telehealth: Payer: Self-pay | Admitting: Internal Medicine

## 2011-04-16 MED ORDER — BUDESONIDE-FORMOTEROL FUMARATE 80-4.5 MCG/ACT IN AERO: 2.0000 | INHALATION_SPRAY | Freq: Two times a day (BID) | RESPIRATORY_TRACT | Status: DC

## 2011-04-16 NOTE — Telephone Encounter (Signed)
Pt is aware Symbicort sent to her pharmacy and anything regarding a diagnosis or something stated in her Ov notes will need to be discussed at her next OV with MR. Pt verbalized understanding.

## 2011-04-16 NOTE — Telephone Encounter (Signed)
[email or web address]  

## 2011-04-18 ENCOUNTER — Other Ambulatory Visit: Payer: Self-pay | Admitting: Internal Medicine

## 2011-04-24 ENCOUNTER — Ambulatory Visit (INDEPENDENT_AMBULATORY_CARE_PROVIDER_SITE_OTHER): Payer: BC Managed Care – PPO | Admitting: *Deleted

## 2011-04-24 DIAGNOSIS — I4891 Unspecified atrial fibrillation: Secondary | ICD-10-CM

## 2011-04-24 LAB — POCT INR: INR: 2.8

## 2011-04-30 NOTE — Assessment & Plan Note (Signed)
Stable disease  Plan Counseled on importance of daily symbicort atleast 1 per day for the winter

## 2011-05-02 ENCOUNTER — Ambulatory Visit: Payer: BC Managed Care – PPO | Admitting: Family Medicine

## 2011-05-07 ENCOUNTER — Other Ambulatory Visit: Payer: Self-pay | Admitting: Family Medicine

## 2011-05-07 NOTE — Telephone Encounter (Signed)
Last seen and filled 04/06/11 please advise     KP

## 2011-05-22 ENCOUNTER — Ambulatory Visit (INDEPENDENT_AMBULATORY_CARE_PROVIDER_SITE_OTHER): Payer: BC Managed Care – PPO | Admitting: *Deleted

## 2011-05-22 ENCOUNTER — Telehealth: Payer: Self-pay | Admitting: Internal Medicine

## 2011-05-22 DIAGNOSIS — I4891 Unspecified atrial fibrillation: Secondary | ICD-10-CM

## 2011-05-22 LAB — POCT INR: INR: 1.8

## 2011-05-22 NOTE — Telephone Encounter (Signed)
Spoke with pt. She is c/o increased cough x several days and states also having some chest tightness. Denies changes in her breathing. No fever. Appt with TP for tomorrow at 10:15 am and I advised that she seek emergency care in the meantime if needed. Pt verbalized understanding.

## 2011-05-23 ENCOUNTER — Ambulatory Visit (INDEPENDENT_AMBULATORY_CARE_PROVIDER_SITE_OTHER): Payer: BC Managed Care – PPO | Admitting: Adult Health

## 2011-05-23 ENCOUNTER — Encounter: Payer: Self-pay | Admitting: Adult Health

## 2011-05-23 DIAGNOSIS — J479 Bronchiectasis, uncomplicated: Secondary | ICD-10-CM

## 2011-05-23 MED ORDER — AMOXICILLIN-POT CLAVULANATE 875-125 MG PO TABS: 1.0000 | ORAL_TABLET | Freq: Two times a day (BID) | ORAL | Status: DC

## 2011-05-23 NOTE — Progress Notes (Signed)
Subjective:    Patient ID: Stephanie Cortez, female    DOB: 1934-01-20, 77 y.o.   MRN: [id number]  HPI 77 yo WF with known hx of Bronchietasis  [place] Bronchiectasis. Last [place] Bronchiectasis. Last seen August 2010, Dec 2010, Jan 2011, FEb 2011 (AE bronciectasis), and  Oct 2011. S/p pulmonary rehab in summer 2011.  Since then had one more visit in Nov 2011 for  AE bronchiectasis. Since then doing well. Denies cough or dyspnea but feels chest is just a bit more tight than usual. . No other specific complaints.  Denies chest pain, dyspnea, orthopnea, hemoptysis, fever, n/v/d, edema, headache.  However, does admit to increased fatigue  + . Uses symbicort as needed which is around 2-3 times per week. Is requesting spirometry  >>rx  symbicort daily    OV 04/12/11:  Using symbicort prn and overall doing well. Only has minimal wheeze. She is anticipatory and pro-acitve in care. When sinus congestion develops uses netti pot and lemon-honey vapors for relief. This prevents wheezing. Typically winter is when she has AE bronchiectasis. Wants to know if she should take prn symbicort or go on daily.  >>no changes   05/23/2011 Acute OV  Complains of head congestion, clear mucus production, PND, chest congestion, cough x1week .  Taking mucinex with some help. Mucus is all clear. No fever or discolored mucus.  Worried that she will be sick over the holidays and she is leaving to go out of town.  She is using netti pot with good relief. She has increased her symbicort Twice daily  With decreased cough.      Past Med: has changed PMD to Dr Laury Axon.  Social: taking care of elder sister in her 7s who has now become dependent Fam hx: as in soccial. No other change   Review of Systems Constitutional:   No  weight loss, night sweats,  Fevers, chills, fatigue, or  lassitude.  HEENT:   No headaches,  Difficulty swallowing,  Tooth/dental problems, or  Sore throat,                No sneezing, itching, ear  ache,  ++nasal congestion, post nasal drip,   CV:  No chest pain,  Orthopnea, PND, swelling in lower extremities, anasarca, dizziness, palpitations, syncope.   GI  No heartburn, indigestion, abdominal pain, nausea, vomiting, diarrhea, change in bowel habits, loss of appetite, bloody stools.   Resp:    No coughing up of blood.  No change in color of mucus.  No wheezing.  No chest wall deformity  Skin: no rash or lesions.  GU: no dysuria, change in color of urine, no urgency or frequency.  No flank pain, no hematuria   MS:  No joint pain or swelling.  No decreased range of motion.  No back pain.  Psych:  No change in mood or affect. No depression or anxiety.  No memory loss.         Objective:   Physical Exam GEN: A/Ox3; pleasant , NAD, well nourished   HEENT:  [place]/AT,  EACs-clear, TMs-wnl, NOSE-clear mucus, THROAT-clear, no lesions, no postnasal drip or exudate noted.   NECK:  Supple w/ fair ROM; no JVD; normal carotid impulses w/o bruits; no thyromegaly or nodules palpated; no lymphadenopathy.  RESP  Clear  P & A; w/o, wheezes/ rales/ or rhonchi.no accessory muscle use, no dullness to percussion  CARD:  RRR, no m/r/g  , no peripheral edema, pulses intact, no cyanosis or clubbing.  GI:  Soft & nt; nml bowel sounds; no organomegaly or masses detected.  Musco: Warm bil, no deformities or joint swelling noted.   Neuro: alert, no focal deficits noted.    Skin: Warm, no lesions or rashes         Assessment & Plan:

## 2011-05-23 NOTE — Patient Instructions (Signed)
Continue on Symbicort 2 puffs Twice daily  Until back to your baseline Mucinex Twice daily  As needed  Cough/congestion  Fluids and rest  I have given you a prescription for antibiotic--Augmentin 875mg   To have on hold in case You developed discolored mucus that is not improving.  Saline nasal rinses As needed   Align is a good Probiotic to take daily  DanActive is a good probiotic drink that tastes good.  Please contact office for sooner follow up if symptoms do not improve or worsen or seek emergency care  follow up Dr. Marchelle Gearing as planned and As needed

## 2011-05-23 NOTE — Assessment & Plan Note (Signed)
URI with obvious bronchiectatic flare   Plan; Continue on Symbicort 2 puffs Twice daily  Until back to your baseline Mucinex Twice daily  As needed  Cough/congestion  Fluids and rest  I have given you a prescription for antibiotic--Augmentin 875mg   To have on hold in case You developed discolored mucus that is not improving.  Saline nasal rinses As needed   Align is a good Probiotic to take daily  DanActive is a good probiotic drink that tastes good.  Please contact office for sooner follow up if symptoms do not improve or worsen or seek emergency care  follow up Dr. Marchelle Gearing as planned and As needed

## 2011-05-28 ENCOUNTER — Telehealth: Payer: Self-pay | Admitting: Internal Medicine

## 2011-05-28 MED ORDER — DOXYCYCLINE HYCLATE 100 MG PO TABS: 100.0000 mg | ORAL_TABLET | Freq: Two times a day (BID) | ORAL | Status: DC

## 2011-05-28 NOTE — Telephone Encounter (Signed)
I spoke with pt and she states she does not want to take her Augmentin bc she is allergic to penicillin. She states she does not want to take any chances on taking the Augmentin. Pt is requesting this be changed. Please advise Tammy, thanks  Allergies  Allergen Reactions  . Moxifloxacin     REACTION: weakness  . Pantoprazole Sodium     REACTION: rash  . Penicillins     REACTION: rash

## 2011-05-28 NOTE — Telephone Encounter (Signed)
That is fine, did not see that on her allergy list  Can have a Rx for Doxycycline 100mg  #20 1 po Twice daily -no refills   Cont w/ ov recs  Please contact office for sooner follow up if symptoms do not improve or worsen or seek emergency care

## 2011-05-29 ENCOUNTER — Telehealth: Payer: Self-pay

## 2011-05-29 ENCOUNTER — Encounter: Payer: Self-pay | Admitting: Family Medicine

## 2011-05-29 ENCOUNTER — Ambulatory Visit (INDEPENDENT_AMBULATORY_CARE_PROVIDER_SITE_OTHER): Payer: BC Managed Care – PPO | Admitting: Family Medicine

## 2011-05-29 VITALS — BP 130/80 | HR 103 | Temp 98.9°F | Wt 187.4 lb

## 2011-05-29 DIAGNOSIS — J40 Bronchitis, not specified as acute or chronic: Secondary | ICD-10-CM

## 2011-05-29 DIAGNOSIS — F419 Anxiety disorder, unspecified: Secondary | ICD-10-CM

## 2011-05-29 DIAGNOSIS — E039 Hypothyroidism, unspecified: Secondary | ICD-10-CM

## 2011-05-29 DIAGNOSIS — F411 Generalized anxiety disorder: Secondary | ICD-10-CM

## 2011-05-29 DIAGNOSIS — R079 Chest pain, unspecified: Secondary | ICD-10-CM

## 2011-05-29 DIAGNOSIS — J329 Chronic sinusitis, unspecified: Secondary | ICD-10-CM

## 2011-05-29 LAB — T3, FREE: T3, Free: 2.7 pg/mL (ref 2.3–4.2)

## 2011-05-29 LAB — TSH: TSH: 1.29 u[IU]/mL (ref 0.35–5.50)

## 2011-05-29 MED ORDER — DOXYCYCLINE HYCLATE 100 MG PO TABS: 100.0000 mg | ORAL_TABLET | Freq: Two times a day (BID) | ORAL | Status: AC

## 2011-05-29 MED ORDER — FLUTICASONE FUROATE 27.5 MCG/SPRAY NA SUSP: 2.0000 | Freq: Every day | NASAL | Status: DC

## 2011-05-29 MED ORDER — LORAZEPAM 0.5 MG PO TABS: ORAL_TABLET | ORAL | Status: DC

## 2011-05-29 NOTE — Progress Notes (Signed)
  Subjective:     Stephanie Cortez is a 77 y.o. female who presents for evaluation of symptoms of a URI. Symptoms include congestion, nasal congestion and productive cough with  green colored sputum. Onset of symptoms was 2 weeks ago, and has been gradually worsening since that time. Treatment to date: neti pot and mucinex.  Pt is also very anxious and stressed because her sister is very sick 6 hours away.  She is asking for a refill on her ativan.    The following portions of the patient's history were reviewed and updated as appropriate: allergies, current medications, past family history, past medical history, past social history, past surgical history and problem list.  Review of Systems Pertinent items are noted in HPI.   Objective:    BP 130/80  Pulse 103  Temp(Src) 98.9 F (37.2 C) (Oral)  Wt 187 lb 6.4 oz (85.004 kg)  SpO2 98% General appearance: alert, cooperative, appears stated age and no distress Head: Normocephalic, without obvious abnormality, atraumatic Ears: normal TM's and external ear canals both ears Nose: green discharge, moderate congestion, sinus tenderness bilateral Throat: lips, mucosa, and tongue normal; teeth and gums normal Neck: mild anterior cervical adenopathy, supple, symmetrical, trachea midline and thyroid not enlarged, symmetric, no tenderness/mass/nodules Lungs: clear to auscultation bilaterally Heart: regular rate and rhythm, S1, S2 normal, no murmur, click, rub or gallop Extremities: extremities normal, atraumatic, no cyanosis or edema Neurologic: Mental status: Alert, oriented, thought content appropriate psych-- + anxious   Assessment:    bronchitis and sinusitis   Plan:    Discussed the diagnosis and treatment of sinusitis. Suggested symptomatic OTC remedies. Nasal saline spray for congestion. doxy per pulm per orders. Nasal steroids per orders. Follow up as needed. Call in several days if symptoms aren't resolving.

## 2011-05-29 NOTE — Progress Notes (Signed)
Agree with plan 

## 2011-05-29 NOTE — Telephone Encounter (Signed)
Pt left message but did not state what she needed.  Returned call to pt and left message for pt to call back

## 2011-05-29 NOTE — Patient Instructions (Signed)

## 2011-05-30 LAB — T4, FREE: Free T4: 1.09 ng/dL (ref 0.60–1.60)

## 2011-05-31 ENCOUNTER — Ambulatory Visit (INDEPENDENT_AMBULATORY_CARE_PROVIDER_SITE_OTHER): Payer: BC Managed Care – PPO | Admitting: Adult Health

## 2011-05-31 ENCOUNTER — Encounter: Payer: Self-pay | Admitting: Adult Health

## 2011-05-31 DIAGNOSIS — J479 Bronchiectasis, uncomplicated: Secondary | ICD-10-CM

## 2011-05-31 NOTE — Assessment & Plan Note (Addendum)
Recent flare now resolving   Plan:  Continue on Symbicort 2 puffs Twice daily  Until back to your baseline Mucinex Twice daily  As needed  Cough/congestion  Fluids and rest  Saline nasal rinses As needed      Please contact office for sooner follow up if symptoms do not improve or worsen or seek emergency care  follow up Dr. Marchelle Gearing 6-8 weeks

## 2011-05-31 NOTE — Progress Notes (Signed)
Subjective:    Patient ID: Stephanie Cortez, female    DOB: 1933/07/05, 77 y.o.   MRN: [id number]  HPI  77 yo WF with known hx of Bronchietasis  [place] Bronchiectasis. Last [place] Bronchiectasis. Last seen August 2010, Dec 2010, Jan 2011, FEb 2011 (AE bronciectasis), and  Oct 2011. S/p pulmonary rehab in summer 2011.  Since then had one more visit in Nov 2011 for  AE bronchiectasis. Since then doing well. Denies cough or dyspnea but feels chest is just a bit more tight than usual. . No other specific complaints.  Denies chest pain, dyspnea, orthopnea, hemoptysis, fever, n/v/d, edema, headache.  However, does admit to increased fatigue  + . Uses symbicort as needed which is around 2-3 times per week. Is requesting spirometry  >>rx  symbicort daily    OV 04/12/11:  Using symbicort prn and overall doing well. Only has minimal wheeze. She is anticipatory and pro-acitve in care. When sinus congestion develops uses netti pot and lemon-honey vapors for relief. This prevents wheezing. Typically winter is when she has AE bronchiectasis. Wants to know if she should take prn symbicort or go on daily.  >>no changes   05/23/2011 Acute OV  Complains of head congestion, clear mucus production, PND, chest congestion, cough x1week .  Taking mucinex with some help. Mucus is all clear. No fever or discolored mucus.  Worried that she will be sick over the holidays and she is leaving to go out of town.  She is using netti pot with good relief. She has increased her symbicort Twice daily  With decreased cough.  >>rx doxycycline  05/31/2011 Follow up  Pt returns for follow up . She is feeling is better. Currently  taking doxycycline.  reports breathing has improved but still having sinuscongestion and drainage. Last ov she was given Augmentin -unfortunately has hx of PCN allergy and abx was changed prior to taking. She is tolerating abx well. Congestion and cough are much better.  No fever or chest pain.     Past Med: has changed PMD to Dr Laury Axon.  Social: taking care of elder sister in her 72s who has now become dependent Fam hx: as in soccial. No other change   Review of Systems  Constitutional:   No  weight loss, night sweats,  Fevers, chills, fatigue, or  lassitude.  HEENT:   No headaches,  Difficulty swallowing,  Tooth/dental problems, or  Sore throat,                No sneezing, itching, ear ache,  ++nasal congestion, post nasal drip,   CV:  No chest pain,  Orthopnea, PND, swelling in lower extremities, anasarca, dizziness, palpitations, syncope.   GI  No heartburn, indigestion, abdominal pain, nausea, vomiting, diarrhea, change in bowel habits, loss of appetite, bloody stools.   Resp:    No coughing up of blood.  No change in color of mucus.  No wheezing.  No chest wall deformity  Skin: no rash or lesions.  GU: no dysuria, change in color of urine, no urgency or frequency.  No flank pain, no hematuria   MS:  No joint pain or swelling.  No decreased range of motion.  No back pain.  Psych:  No change in mood or affect. No depression or anxiety.  No memory loss.         Objective:   Physical Exam  GEN: A/Ox3; pleasant , NAD, well nourished   HEENT:  [place]/AT,  EACs-clear, TMs-wnl, NOSE-clear mucus,  THROAT-clear, no lesions, no postnasal drip or exudate noted.   NECK:  Supple w/ fair ROM; no JVD; normal carotid impulses w/o bruits; no thyromegaly or nodules palpated; no lymphadenopathy.  RESP  Clear  P & A; w/o, wheezes/ rales/ or rhonchi.no accessory muscle use, no dullness to percussion  CARD:  RRR, no m/r/g  , no peripheral edema, pulses intact, no cyanosis or clubbing.  GI:   Soft & nt; nml bowel sounds; no organomegaly or masses detected.  Musco: Warm bil, no deformities or joint swelling noted.   Neuro: alert, no focal deficits noted.    Skin: Warm, no lesions or rashes         Assessment & Plan:

## 2011-05-31 NOTE — Patient Instructions (Addendum)
Continue on Symbicort 2 puffs Twice daily  Until back to your baseline Mucinex Twice daily  As needed  Cough/congestion  Fluids and rest  Saline nasal rinses As needed      Please contact office for sooner follow up if symptoms do not improve or worsen or seek emergency care  follow up Dr. Marchelle Gearing 6-8 weeks

## 2011-06-01 NOTE — Progress Notes (Signed)
Noted. Agree with plan.

## 2011-06-02 ENCOUNTER — Other Ambulatory Visit: Payer: Self-pay | Admitting: Family Medicine

## 2011-06-18 ENCOUNTER — Telehealth: Payer: Self-pay

## 2011-06-18 ENCOUNTER — Telehealth: Payer: Self-pay | Admitting: Internal Medicine

## 2011-06-18 NOTE — Telephone Encounter (Signed)
Seen 07/28/10 for Bronchitis and she stated she took 10 days of Doxy and her symptoms are back, she is not wheezing, some Headache and weakness, she has used the nettie pot without relief. Wants to get the Doxy refilled. ----Msg left to call the office to discuss.       KP

## 2011-06-18 NOTE — Telephone Encounter (Signed)
Spoke with patient and she stated she is having some nausea and feels like her Bronchitis is coming back, she is using mucinex to try to keep the mucus thin, she had a low grade temp this morning and has been sleep most of the day, has some discolored mucus, chest congestion, and discoloration when using the nettie pot, denied bodyaches, she wants to know if she could get a refill on the Doxy. Please advise     KP

## 2011-06-18 NOTE — Telephone Encounter (Signed)
Discussed with patient and she voiced understanding she stated she would contact Dr.Ramaswamy's office    KP

## 2011-06-18 NOTE — Telephone Encounter (Signed)
I spoke with pt and she c/o nasal congestion, cough w/ yellow phlem, sweats, ? Fever, chest feels funny. Pt is scheduled to come in and see TP in the am at 10:00 for an evaluation. Pt aware to seek emergency care if she worsens overnight. Pt voiced her understanding

## 2011-06-18 NOTE — Telephone Encounter (Signed)
Pulmonary wanted to see her if her symptoms returned

## 2011-06-19 ENCOUNTER — Ambulatory Visit (INDEPENDENT_AMBULATORY_CARE_PROVIDER_SITE_OTHER): Payer: BC Managed Care – PPO | Admitting: Adult Health

## 2011-06-19 ENCOUNTER — Ambulatory Visit (INDEPENDENT_AMBULATORY_CARE_PROVIDER_SITE_OTHER): Payer: BC Managed Care – PPO | Admitting: *Deleted

## 2011-06-19 ENCOUNTER — Encounter: Payer: Self-pay | Admitting: Adult Health

## 2011-06-19 VITALS — BP 144/86 | HR 98 | Temp 97.1°F | Ht 67.0 in | Wt 188.8 lb

## 2011-06-19 DIAGNOSIS — I4891 Unspecified atrial fibrillation: Secondary | ICD-10-CM

## 2011-06-19 DIAGNOSIS — J069 Acute upper respiratory infection, unspecified: Secondary | ICD-10-CM | POA: Insufficient documentation

## 2011-06-19 LAB — POCT INR: INR: 2.5

## 2011-06-19 MED ORDER — AZITHROMYCIN 250 MG PO TABS: ORAL_TABLET | ORAL | Status: AC

## 2011-06-19 NOTE — Patient Instructions (Signed)
Continue on Symbicort 2 puffs Twice daily  Until back to your baseline Mucinex Twice daily  As needed  Cough/congestion  Fluids and rest  I have given you a prescription for antibiotic--Zpack  To have on hold in case You developed discolored mucus that is not improving.  Saline nasal rinses As needed    Please contact office for sooner follow up if symptoms do not improve or worsen or seek emergency care  follow up Dr. Marchelle Gearing as planned and As needed

## 2011-06-19 NOTE — Progress Notes (Signed)
Subjective:    Patient ID: Stephanie Cortez, female    DOB: 12-21-1933, 77 y.o.   MRN: [id number]  HPI  77 yo WF with known hx of Bronchietasis  F[place] Bronchiectasis. Last [place] Bronchiectasis. Last seen August 2010, Dec 2010, Jan 2011, FEb 2011 (AE bronciectasis), and  Oct 2011. S/p pulmonary rehab in summer 2011.  Since then had one more visit in Nov 2011 for  AE bronchiectasis. Since then doing well. Denies cough or dyspnea but feels chest is just a bit more tight than usual. . No other specific complaints.  Denies chest pain, dyspnea, orthopnea, hemoptysis, fever, n/v/d, edema, headache.  However, does admit to increased fatigue  + . Uses symbicort as needed which is around 2-3 times per week. Is requesting spirometry  >>rx  symbicort daily    OV 04/12/11:  Using symbicort prn and overall doing well. Only has minimal wheeze. She is anticipatory and pro-acitve in care. When sinus congestion develops uses netti pot and lemon-honey vapors for relief. This prevents wheezing. Typically winter is when she has AE bronchiectasis. Wants to know if she should take prn symbicort or go on daily.  >>no changes   05/23/2011 Acute OV  Complains of head congestion, clear mucus production, PND, chest congestion, cough x1week .  Taking mucinex with some help. Mucus is all clear. No fever or discolored mucus.  Worried that she will be sick over the holidays and she is leaving to go out of town.  She is using netti pot with good relief. She has increased her symbicort Twice daily  With decreased cough.  >>rx doxycycline  11/29 /2012 Follow up  Pt returns for follow up . She is feeling is better. Currently  taking doxycycline.  reports breathing has improved but still having sinuscongestion and drainage. Last ov she was given Augmentin -unfortunately has hx of PCN allergy and abx was changed prior to taking. She is tolerating abx well. Congestion and cough are much better.  No fever or chest pain.  >>No  changes   06/19/2011 Acute OV Complains of wheezing, head congestion with green drainage, hacky cough x3-4days. OTC not helping. Using Netti pot rinses .  No hemotpysis . Worse in am and late at night.  Cough is getting  Worse. Does not want to be sick for the holidays-has too much to do.  Under a lot of stress with sister that is ill. Traveling back and forth to [place].     Past Med: has changed PMD to Dr Laury Axon.  Social: taking care of elder sister in her 47s who has now become dependent Fam hx: as in soccial. No other change   Review of Systems  Constitutional:   No  weight loss, night sweats,  Fevers, chills, fatigue, or  lassitude.  HEENT:   No headaches,  Difficulty swallowing,  Tooth/dental problems, or  Sore throat,                No sneezing, itching, ear ache,  ++nasal congestion, post nasal drip,   CV:  No chest pain,  Orthopnea, PND, swelling in lower extremities, anasarca, dizziness, palpitations, syncope.   GI  No heartburn, indigestion, abdominal pain, nausea, vomiting, diarrhea, change in bowel habits, loss of appetite, bloody stools.   Resp:    No coughing up of blood.  No change in color of mucus.  No wheezing.  No chest wall deformity  Skin: no rash or lesions.  GU: no dysuria, change in color of urine, no  urgency or frequency.  No flank pain, no hematuria   MS:  No joint pain or swelling.  No decreased range of motion.  No back pain.  Psych:  No change in mood or affect. No depression or anxiety.  No memory loss.         Objective:   Physical Exam  GEN: A/Ox3; pleasant , NAD, well nourished   HEENT:  [place]/AT,  EACs-clear, TMs-wnl, NOSE-clear mucus,, THROAT-clear, no lesions, no postnasal drip or exudate noted.   NECK:  Supple w/ fair ROM; no JVD; normal carotid impulses w/o bruits; no thyromegaly or nodules palpated; no lymphadenopathy.  RESP  Coarse BS ; w/o, wheezes/ rales/ or rhonchi.no accessory muscle use, no dullness to percussion  CARD:   RRR, no m/r/g  , no peripheral edema, pulses intact, no cyanosis or clubbing.  GI:   Soft & nt; nml bowel sounds; no organomegaly or masses detected.  Musco: Warm bil, no deformities or joint swelling noted.   Neuro: alert, no focal deficits noted.    Skin: Warm, no lesions or rashes         Assessment & Plan:

## 2011-06-19 NOTE — Assessment & Plan Note (Signed)
URI   Plan:  Continue on Symbicort 2 puffs Twice daily  Until back to your baseline Mucinex Twice daily  As needed  Cough/congestion  Fluids and rest  I have given you a prescription for antibiotic--Zpack  To have on hold in case You developed discolored mucus that is not improving.  Saline nasal rinses As needed    Please contact office for sooner follow up if symptoms do not improve or worsen or seek emergency care  follow up Dr. Marchelle Gearing as planned and As needed

## 2011-07-09 NOTE — Progress Notes (Signed)
Agree with mgmt of URI

## 2011-07-17 ENCOUNTER — Encounter: Payer: BC Managed Care – PPO | Admitting: *Deleted

## 2011-07-18 ENCOUNTER — Telehealth: Payer: Self-pay | Admitting: *Deleted

## 2011-07-18 NOTE — Telephone Encounter (Signed)
Pt left VM that she is currently out of town and has a ear infection and would like to know if Dr Laury Axon can call in a antibiotic. Pt c/o a lot of pain/pressure in left ear. Pt notes that she has been using neti-pot. Advise Pt that OV would be needed in order for med to be Rx and that since she is out of town she should be seen at local UC for symptoms. Pt ok but stated that she has pending appt for Friday with Dr Laury Axon when she get back in town. Pt advise ok to keep appt if she can wait til then but if symptoms worsen then she needs to be see locally, Pt ok

## 2011-07-20 ENCOUNTER — Ambulatory Visit: Payer: BC Managed Care – PPO | Admitting: Family Medicine

## 2011-07-23 ENCOUNTER — Other Ambulatory Visit: Payer: Self-pay | Admitting: Family Medicine

## 2011-07-23 ENCOUNTER — Ambulatory Visit (INDEPENDENT_AMBULATORY_CARE_PROVIDER_SITE_OTHER): Payer: BC Managed Care – PPO | Admitting: Family Medicine

## 2011-07-23 ENCOUNTER — Encounter: Payer: Self-pay | Admitting: Family Medicine

## 2011-07-23 VITALS — BP 124/74 | HR 87 | Temp 99.0°F | Wt 191.0 lb

## 2011-07-23 DIAGNOSIS — J329 Chronic sinusitis, unspecified: Secondary | ICD-10-CM

## 2011-07-23 DIAGNOSIS — F411 Generalized anxiety disorder: Secondary | ICD-10-CM | POA: Diagnosis not present

## 2011-07-23 DIAGNOSIS — F419 Anxiety disorder, unspecified: Secondary | ICD-10-CM

## 2011-07-23 MED ORDER — CLARITHROMYCIN ER 500 MG PO TB24: 1000.0000 mg | ORAL_TABLET | Freq: Every day | ORAL | Status: AC

## 2011-07-23 NOTE — Telephone Encounter (Signed)
Pt is also taking coumadin and diltiazem from Dr Graciela Husbands do you still want to prescribe clarithromycin. [name] KitchenPlease advise

## 2011-07-23 NOTE — Telephone Encounter (Signed)
Discuss with patient pharmacy will call coumadin clinic in AM.

## 2011-07-23 NOTE — Patient Instructions (Signed)

## 2011-07-23 NOTE — Progress Notes (Signed)
  Subjective:     Stephanie Cortez is a 78 y.o. female who presents for evaluation of sinus pain. Symptoms include: congestion, facial pain, nasal congestion, sinus pressure and L ear pain. Onset of symptoms was 2 weeks ago. Symptoms have been gradually worsening since that time. Past history is significant for no history of pneumonia or bronchitis. Patient is a non-smoker.  The following portions of the patient's history were reviewed and updated as appropriate: allergies, current medications, past family history, past medical history, past social history, past surgical history and problem list.  Review of Systems Pertinent items are noted in HPI.   Objective:    BP 124/74  Pulse 87  Temp(Src) 99 F (37.2 C) (Oral)  Wt 191 lb (86.637 kg)  SpO2 97% General appearance: alert, cooperative, appears stated age and no distress Ears: R ear normal,  l ear--+ fluid Nose: green discharge, moderate congestion, sinus tenderness bilateral Throat: lips, mucosa, and tongue normal; teeth and gums normal Neck: mild anterior cervical adenopathy, supple, symmetrical, trachea midline and thyroid not enlarged, symmetric, no tenderness/mass/nodules Lungs: clear to auscultation bilaterally Heart: regular rate and rhythm, S1, S2 normal, no murmur, click, rub or gallop Extremities: extremities normal, atraumatic, no cyanosis or edema    Assessment:    Acute bacterial sinusitis.   Anxiety--- stable but sisters health is declining--- alz dementia Plan:    Neti pot recommended. Instructions given. Nasal steroids per medication orders. Biaxin per medication orders.

## 2011-07-23 NOTE — Telephone Encounter (Signed)
Yes -- she will just need to Pt checked sooner----please call coumadin clinic and let them know we put her on abx for her let so they can check her sooner.

## 2011-07-24 ENCOUNTER — Encounter: Payer: BC Managed Care – PPO | Admitting: *Deleted

## 2011-07-24 NOTE — Telephone Encounter (Signed)
Coumadin clinic aware will contact Pt for INR check.

## 2011-07-27 ENCOUNTER — Ambulatory Visit (INDEPENDENT_AMBULATORY_CARE_PROVIDER_SITE_OTHER): Payer: BC Managed Care – PPO | Admitting: *Deleted

## 2011-07-27 ENCOUNTER — Encounter: Payer: BC Managed Care – PPO | Admitting: *Deleted

## 2011-07-27 DIAGNOSIS — I4891 Unspecified atrial fibrillation: Secondary | ICD-10-CM

## 2011-07-27 LAB — POCT INR: INR: 2

## 2011-08-01 ENCOUNTER — Ambulatory Visit (INDEPENDENT_AMBULATORY_CARE_PROVIDER_SITE_OTHER): Payer: BC Managed Care – PPO | Admitting: Internal Medicine

## 2011-08-01 ENCOUNTER — Encounter: Payer: Self-pay | Admitting: Internal Medicine

## 2011-08-01 VITALS — BP 120/78 | HR 86 | Temp 98.1°F | Ht 67.0 in | Wt 197.0 lb

## 2011-08-01 DIAGNOSIS — J329 Chronic sinusitis, unspecified: Secondary | ICD-10-CM | POA: Diagnosis not present

## 2011-08-01 NOTE — Progress Notes (Signed)
Subjective:    Patient ID: Stephanie Cortez, female    DOB: 1933/07/07, 78 y.o.   MRN: [id number]  HPI 78 yo WF with known hx of Bronchietasis  Follouwp Bronchiectasis. Last ollowup Bronchiectasis. Last seen August 2010, Dec 2010, Jan 2011, FEb 2011 (AE bronciectasis), and  Oct 2011. S/p pulmonary rehab in summer 2011.  Since then had one more visit in Nov 2011 for  AE bronchiectasis. Since then doing well. Denies cough or dyspnea but feels chest is just a bit more tight than usual. . No other specific complaints.  Denies chest pain, dyspnea, orthopnea, hemoptysis, fever, n/v/d, edema, headache.  However, does admit to increased fatigue  + . Uses symbicort as needed which is around 2-3 times per week. Is requesting spirometry  >>rx  symbicort daily    OV 04/12/11:  Using symbicort prn and overall doing well. Only has minimal wheeze. She is anticipatory and pro-acitve in care. When sinus congestion develops uses netti pot and lemon-honey vapors for relief. This prevents wheezing. Typically winter is when she has AE bronchiectasis. Wants to know if she should take prn symbicort or go on daily.  >>no changes   05/23/2011 Acute OV  Complains of head congestion, clear mucus production, PND, chest congestion, cough x1week .  Taking mucinex with some help. Mucus is all clear. No fever or discolored mucus.  Worried that she will be sick over the holidays and she is leaving to go out of town.  She is using netti pot with good relief. She has increased her symbicort Twice daily  With decreased cough.  >>rx doxycycline  11/29 /2012 Follow up  Pt returns for follow up . She is feeling is better. Currently  taking doxycycline.  reports breathing has improved but still having sinuscongestion and drainage. Last ov she was given Augmentin -unfortunately has hx of PCN allergy and abx was changed prior to taking. She is tolerating abx well. Congestion and cough are much better.  No fever or chest pain.  >>No  changes   06/19/2011 Acute OV Complains of wheezing, head congestion with green drainage, hacky cough x3-4days. OTC not helping. Using Netti pot rinses .  No hemotpysis . Worse in am and late at night.  Cough is getting  Worse. Does not want to be sick for the holidays-has too much to do.  Under a lot of stress with sister that is ill. Traveling back and forth to [place].     Past Med: has changed PMD to Dr Laury Axon.  Social: taking care of elder sister in her 55s who has now become dependent Fam hx: as in soccial. No other change   REC Continue on Symbicort 2 puffs Twice daily Until back to your baseline  Mucinex Twice daily As needed Cough/congestion  Fluids and rest  I have given you a prescription for antibiotic--Zpack To have on hold in case  You developed discolored mucus that is not improving.  Saline nasal rinses As needed  Please contact office for sooner follow up if symptoms do not improve or worsen or seek emergency care  follow up Dr. Marchelle Gearing as planned and As needed   OV 08/01/2011  D 23 of 28 biaxin. Blocked sinus. Sinus pressure. Hx of eustachian tube grommets. No resp issues. Netti pot not working - too blocked. Denies fever, sputum, resp exacerbation, wheeze, edema, orthopnea, paroxysmal nocturnal dyspnea    Review of Systems  Constitutional: Negative for fever and unexpected weight change.  HENT: Positive for congestion and  sinus pressure. Negative for ear pain, nosebleeds, sore throat, rhinorrhea, sneezing, trouble swallowing, dental problem and postnasal drip.   Eyes: Negative for redness and itching.  Respiratory: Positive for cough and wheezing. Negative for chest tightness and shortness of breath.   Cardiovascular: Negative for palpitations and leg swelling.  Gastrointestinal: Negative for nausea and vomiting.  Genitourinary: Negative for dysuria.  Musculoskeletal: Negative for joint swelling.  Skin: Negative for rash.  Neurological: Negative for  headaches.  Hematological: Does not bruise/bleed easily.  Psychiatric/Behavioral: Negative for dysphoric mood. The patient is not nervous/anxious.        Objective:   Physical Exam  GEN: A/Ox3; pleasant , NAD, well nourished   HEENT:  [place]/AT,  EACs-clear, TMs-wnl, NOSE-clear mucus,, THROAT-clear, no lesions, no postnasal drip or exudate noted.  LEFT TURBINATE SWOLLEN  NECK:  Supple w/ fair ROM; no JVD; normal carotid impulses w/o bruits; no thyromegaly or nodules palpated; no lymphadenopathy.  RESP  Coarse BS ; w/o, wheezes/ rales/ or rhonchi.no accessory muscle use, no dullness to percussion  CARD:  RRR, no m/r/g  , no peripheral edema, pulses intact, no cyanosis or clubbing.  GI:   Soft & nt; nml bowel sounds; no organomegaly or masses detected.  Musco: Warm bil, no deformities or joint swelling noted.   Neuro: alert, no focal deficits noted.    Skin: Warm, no lesions or rashes      Assessment & Plan:

## 2011-08-01 NOTE — Patient Instructions (Signed)
Currently lungs appear ok Our coordinator will find out who saaw you in [facility]. We will try to have you seen this week REturn in 3 months or sooner if needed Please continue with your medications

## 2011-08-02 ENCOUNTER — Encounter: Payer: Self-pay | Admitting: Internal Medicine

## 2011-08-02 DIAGNOSIS — J329 Chronic sinusitis, unspecified: Secondary | ICD-10-CM | POA: Insufficient documentation

## 2011-08-02 NOTE — Assessment & Plan Note (Signed)
She has hx of prior ENT procedures and currently chronic sinus issues despite 4 week biaxin Rx. Have advised her that eNT is best to deal with this. Refer [place] [facility]; she might have been there before

## 2011-08-06 DIAGNOSIS — H40019 Open angle with borderline findings, low risk, unspecified eye: Secondary | ICD-10-CM | POA: Diagnosis not present

## 2011-08-06 DIAGNOSIS — H35369 Drusen (degenerative) of macula, unspecified eye: Secondary | ICD-10-CM | POA: Diagnosis not present

## 2011-08-06 DIAGNOSIS — H43399 Other vitreous opacities, unspecified eye: Secondary | ICD-10-CM | POA: Diagnosis not present

## 2011-08-08 DIAGNOSIS — J479 Bronchiectasis, uncomplicated: Secondary | ICD-10-CM | POA: Diagnosis not present

## 2011-08-08 DIAGNOSIS — J310 Chronic rhinitis: Secondary | ICD-10-CM | POA: Diagnosis not present

## 2011-08-08 DIAGNOSIS — H6980 Other specified disorders of Eustachian tube, unspecified ear: Secondary | ICD-10-CM | POA: Diagnosis not present

## 2011-08-08 DIAGNOSIS — J019 Acute sinusitis, unspecified: Secondary | ICD-10-CM | POA: Diagnosis not present

## 2011-08-20 ENCOUNTER — Other Ambulatory Visit: Payer: Self-pay | Admitting: Family Medicine

## 2011-08-20 NOTE — Telephone Encounter (Signed)
Last OV 06-01-11, last Filled 05-29-11 #90 1

## 2011-08-24 ENCOUNTER — Ambulatory Visit (INDEPENDENT_AMBULATORY_CARE_PROVIDER_SITE_OTHER): Payer: BC Managed Care – PPO | Admitting: Pharmacist

## 2011-08-24 DIAGNOSIS — I4891 Unspecified atrial fibrillation: Secondary | ICD-10-CM | POA: Diagnosis not present

## 2011-08-24 LAB — POCT INR: INR: 2.3

## 2011-09-27 ENCOUNTER — Other Ambulatory Visit: Payer: Self-pay | Admitting: Family Medicine

## 2011-10-03 ENCOUNTER — Other Ambulatory Visit: Payer: Self-pay | Admitting: Internal Medicine

## 2011-10-05 ENCOUNTER — Ambulatory Visit (INDEPENDENT_AMBULATORY_CARE_PROVIDER_SITE_OTHER): Payer: BC Managed Care – PPO | Admitting: Pharmacist

## 2011-10-05 DIAGNOSIS — I4891 Unspecified atrial fibrillation: Secondary | ICD-10-CM | POA: Diagnosis not present

## 2011-10-05 LAB — POCT INR: INR: 1.8

## 2011-10-08 ENCOUNTER — Ambulatory Visit (INDEPENDENT_AMBULATORY_CARE_PROVIDER_SITE_OTHER)
Admission: RE | Admit: 2011-10-08 | Discharge: 2011-10-08 | Disposition: A | Discharge status: Home / Self Care | Payer: BC Managed Care – PPO | Source: Ambulatory Visit | Attending: Family Medicine | Admitting: Family Medicine

## 2011-10-08 ENCOUNTER — Ambulatory Visit (INDEPENDENT_AMBULATORY_CARE_PROVIDER_SITE_OTHER): Payer: BC Managed Care – PPO | Admitting: Family Medicine

## 2011-10-08 ENCOUNTER — Encounter: Payer: Self-pay | Admitting: Family Medicine

## 2011-10-08 VITALS — BP 138/70 | HR 88 | Temp 98.6°F | Wt 191.0 lb

## 2011-10-08 DIAGNOSIS — R1032 Left lower quadrant pain: Secondary | ICD-10-CM

## 2011-10-08 DIAGNOSIS — R109 Unspecified abdominal pain: Secondary | ICD-10-CM | POA: Diagnosis not present

## 2011-10-08 LAB — BASIC METABOLIC PANEL
BUN: 12 mg/dL (ref 6–23)
CO2: 24 mEq/L (ref 19–32)
Calcium: 9.7 mg/dL (ref 8.4–10.5)
Chloride: 102 mEq/L (ref 96–112)
Creatinine, Ser: 0.8 mg/dL (ref 0.4–1.2)
GFR: 77.43 mL/min (ref >60.00)
Glucose, Bld: 93 mg/dL (ref 70–99)
Potassium: 4.3 mEq/L (ref 3.5–5.1)
Sodium: 134 mEq/L — ABNORMAL LOW (ref 135–145)

## 2011-10-08 LAB — LIPID PANEL
Cholesterol: 200 mg/dL (ref 0–200)
HDL: 91.9 mg/dL (ref >39.00)
LDL Cholesterol: 93 mg/dL (ref 0–99)
Total CHOL/HDL Ratio: 2
Triglycerides: 75 mg/dL (ref 0.0–149.0)
VLDL: 15 mg/dL (ref 0.0–40.0)

## 2011-10-08 LAB — CBC WITH DIFFERENTIAL/PLATELET
Basophils Absolute: 0 10*3/uL (ref 0.0–0.1)
Basophils Relative: 0.5 % (ref 0.0–3.0)
Eosinophils Absolute: 0.1 10*3/uL (ref 0.0–0.7)
Eosinophils Relative: 2.1 % (ref 0.0–5.0)
HCT: 38.6 % (ref 36.0–46.0)
Hemoglobin: 12.8 g/dL (ref 12.0–15.0)
Lymphocytes Relative: 22 % (ref 12.0–46.0)
Lymphs Abs: 1.3 10*3/uL (ref 0.7–4.0)
MCHC: 33 g/dL (ref 30.0–36.0)
MCV: 95.8 fl (ref 78.0–100.0)
Monocytes Absolute: 0.4 10*3/uL (ref 0.1–1.0)
Monocytes Relative: 7.2 % (ref 3.0–12.0)
Neutro Abs: 4 10*3/uL (ref 1.4–7.7)
Neutrophils Relative %: 68.2 % (ref 43.0–77.0)
Platelets: 263 10*3/uL (ref 150.0–400.0)
RBC: 4.03 Mil/uL (ref 3.87–5.11)
RDW: 13.2 % (ref 11.5–14.6)
WBC: 5.8 10*3/uL (ref 4.5–10.5)

## 2011-10-08 LAB — HEPATIC FUNCTION PANEL
ALT: 25 U/L (ref 0–35)
AST: 27 U/L (ref 0–37)
Albumin: 4.2 g/dL (ref 3.5–5.2)
Alkaline Phosphatase: 82 U/L (ref 39–117)
Bilirubin, Direct: 0 mg/dL (ref 0.0–0.3)
Total Bilirubin: 0.6 mg/dL (ref 0.3–1.2)
Total Protein: 6.9 g/dL (ref 6.0–8.3)

## 2011-10-08 LAB — LIPASE: Lipase: 29 U/L (ref 11.0–59.0)

## 2011-10-08 LAB — AMYLASE: Amylase: 59 U/L (ref 27–131)

## 2011-10-08 LAB — H. PYLORI ANTIBODY, IGG: H Pylori IgG: POSITIVE

## 2011-10-08 MED ORDER — IOHEXOL 300 MG/ML  SOLN
100.0000 mL | Freq: Once | INTRAMUSCULAR | Status: AC | PRN
  Administered 2011-10-08: 100 mL via INTRAVENOUS

## 2011-10-08 NOTE — Progress Notes (Signed)
  Subjective:     Stephanie Cortez is a 78 y.o. female who presents for evaluation of abdominal pain. Onset was 1 year ago. Symptoms have been gradually worsening. The pain is described as sharp, and is 10/10 in intensity. Pain is located in the LLQ without radiation.  Aggravating factors: none.  Alleviating factors: none. Associated symptoms: melena. The patient denies anorexia, arthralagias, belching, chills, constipation, diarrhea, dysuria, fever, flatus, frequency, headache, hematochezia, hematuria, myalgias, nausea, sweats and vomiting.  Pt has been in [place] with her sister and has been struggling with LLq pain that comes and goes and blood in stool.    The patient's history has been marked as reviewed and updated as appropriate.  Review of Systems Pertinent items are noted in HPI.     Objective:    BP 138/70  Pulse 88  Temp(Src) 98.6 F (37 C) (Oral)  Wt 191 lb (86.637 kg)  SpO2 97% General appearance: alert, cooperative, appears stated age and no distress Abdomen: abnormal findings:  mild tenderness in the LLQ    Assessment:    Abdominal pain, ? diverticulitis .    Plan:    See orders for lab and imaging studies. Adhere to simple, bland diet. Further follow-up plans will be based on outcome of lab/imaging studies; see orders. Follow up as needed.

## 2011-10-08 NOTE — Progress Notes (Signed)
  Subjective:    Patient ID: Stephanie Cortez, female    DOB: 1933-07-24, 78 y.o.   MRN: [id number]  HPI    Review of Systems     Objective:   Physical Exam  Abdominal:       Rectal --heme neg brown stool                + ext hem          Assessment & Plan:

## 2011-10-08 NOTE — Patient Instructions (Signed)

## 2011-10-09 ENCOUNTER — Encounter: Payer: Self-pay | Admitting: *Deleted

## 2011-10-09 ENCOUNTER — Telehealth: Payer: Self-pay | Admitting: Family Medicine

## 2011-10-09 NOTE — Telephone Encounter (Signed)
Discuss with patient   Neg CT

## 2011-10-09 NOTE — Telephone Encounter (Signed)
Patient is requesting CT results from yesterday °

## 2011-10-12 DIAGNOSIS — A048 Other specified bacterial intestinal infections: Secondary | ICD-10-CM

## 2011-10-15 ENCOUNTER — Other Ambulatory Visit: Payer: Self-pay | Admitting: *Deleted

## 2011-10-15 MED ORDER — CLARITHROMYCIN 500 MG PO TABS: 500.0000 mg | ORAL_TABLET | Freq: Two times a day (BID) | ORAL | Status: DC

## 2011-10-15 MED ORDER — METRONIDAZOLE 500 MG PO TABS: 500.0000 mg | ORAL_TABLET | Freq: Two times a day (BID) | ORAL | Status: DC

## 2011-10-16 ENCOUNTER — Telehealth: Payer: Self-pay | Admitting: *Deleted

## 2011-10-16 NOTE — Telephone Encounter (Signed)
Take omeprazole twice a day. She should hold the pravastatin until she has no nausea or vomiting.

## 2011-10-16 NOTE — Telephone Encounter (Signed)
[facility] Triage Call Report Triage Record Num: [id number] Operator: Maryfrances Bunnell Patient Name: Stephanie Cortez Call Date & Time: 10/16/2011 11:21:06AM Patient Phone: [phone] PCP: [name] Patient Gender: Female PCP Fax : [phone] Patient DOB: [birth year] Practice Name: [facility] Reason for Call: Caller: Janet Berlin; PCP: [name].; CB#: [phone]; ; ; Call regarding N/V Diarrhea On Abx; Dx'd with H-pylori and ABX started 10/15/11. Onset vomiting and diarrhea 2300 4/15 after first doses of medicines started at 1600 w/food. Last vomited at 0800am. Has not taken any home medications today. States even with ice chips she has diarrhea. Per Nausea or Vomiting Protocol disposition to Call Provider Immediately back line called and instructed to send message to office for MD and they will contact patient. Home care advice given. Protocol(s) Used: Nausea or Vomiting Recommended Outcome per Protocol: Call Provider Immediately Reason for Outcome: Unable to take or keep down prescription medication (heart, respiratory, diabetes, thyroid, antibiotics, birth control pills, corticosteroids) because of nausea/vomiting Care Advice: ~ Avoid drinking alcoholic or caffeinated beverages. ~ Speak with provider before next dose of medication is due. ~ SYMPTOM / CONDITION MANAGEMENT ~ IMMEDIATE ACTION Discontinue nonprescribed medications and complementary/alternative medications. Continue prescribed medication(s) at ordered dosage/frequency until discussed with provider. ~ See another provider immediately if unable to talk with your provider within 1 hour. Consider the closest urgent care or ED if an office or clinic is not available. Another adult should drive. ~ Vomiting Care Advice: - Do not eat solid foods until vomiting subsides. - Begin taking fluids by sucking on ice chips or popsicles or taking sips of cool clear, nonprescription oral  rehydration solution). - Gradually drink larger amounts of these fluids so that you are drinking six to eight 8 oz. (.2 liter) of fluids a day. - Keep activity to a minimum. - After vomiting subsides, eat smaller, more frequent meals of easily digested foods such as crackers, toast, bananas, rice, cooked cereal, applesauce, broth, baked or mashed potatoes, chicken or [place] without skin. Eat slowly. - Take fluids 30 minutes before or 60 minutes after meals. - Avoid high fat, highly seasoned, high fiber or high sugar content foods. - Avoid extremely hot or cold foods. - Do not take pain medication (such as aspirin, NSAIDs) while nauseated or vomiting. - Consult your provider for advice regarding continuing prescription medication. - Rest as much as possible in a sitting or in a propped lying position. Do not lie flat for at least 2 hours after eating. ~ 10/16/2011 11:48:01AM

## 2011-10-16 NOTE — Telephone Encounter (Signed)
Pt states that symptoms have improved however she does not have anything in her stomach now, Pt notes that she will try med again today and if symptoms occur again will given [place] a call in am.

## 2011-10-17 ENCOUNTER — Encounter: Payer: Self-pay | Admitting: Gastroenterology

## 2011-10-17 ENCOUNTER — Telehealth: Payer: Self-pay | Admitting: Gastroenterology

## 2011-10-17 ENCOUNTER — Telehealth: Payer: Self-pay | Admitting: *Deleted

## 2011-10-17 NOTE — Telephone Encounter (Signed)
Discuss with patient  

## 2011-10-17 NOTE — Telephone Encounter (Signed)
Spoke with Renee at Dr [facility] of[facility] about pt. She reports she has already informed pt of the appt on Nov 06, 2011, but she is on a waiting list. Luster Landsberg stated pt is ok with this. Pt reports she [place] GI doc [place]. Instructed Renee to call us back for worsening s&s.

## 2011-10-17 NOTE — Telephone Encounter (Signed)
Pt called and states she has been placed on Metronidazole 500 mg bid and Clarithromycin 500 mg 2 times a day and states she took dose on Monday and became nauseated and did not take these meds on Tuesday but did take dose today  Concerned about her INR  Appt  Made for her to be seen in Coumadin clinic on Friday April 19th

## 2011-10-18 ENCOUNTER — Other Ambulatory Visit: Payer: Self-pay | Admitting: Internal Medicine

## 2011-10-18 ENCOUNTER — Other Ambulatory Visit: Payer: Self-pay | Admitting: *Deleted

## 2011-10-19 ENCOUNTER — Ambulatory Visit (INDEPENDENT_AMBULATORY_CARE_PROVIDER_SITE_OTHER): Payer: BC Managed Care – PPO | Admitting: *Deleted

## 2011-10-19 ENCOUNTER — Telehealth: Payer: Self-pay | Admitting: Internal Medicine

## 2011-10-19 DIAGNOSIS — I4891 Unspecified atrial fibrillation: Secondary | ICD-10-CM | POA: Diagnosis not present

## 2011-10-19 LAB — POCT INR: INR: 2.8

## 2011-10-19 NOTE — Telephone Encounter (Signed)
Forwarding to Dr. Graciela Husbands. Patient is due for a colonoscopy on 5/7. If she needs to stop coumadin, can she have the ok to do this for 5 days prior? I do not see a history of stroke listed.

## 2011-10-22 NOTE — Telephone Encounter (Signed)
Yes she shoujld be at sufficiently low risk

## 2011-10-23 NOTE — Telephone Encounter (Signed)
I have spoken with the patient and she is aware of Dr. Odessa Fleming recommendations.

## 2011-10-24 ENCOUNTER — Emergency Department (HOSPITAL_COMMUNITY): Payer: BC Managed Care – PPO

## 2011-10-24 ENCOUNTER — Telehealth: Payer: Self-pay | Admitting: Family Medicine

## 2011-10-24 ENCOUNTER — Emergency Department (HOSPITAL_COMMUNITY)
Admission: EM | Admit: 2011-10-24 | Discharge: 2011-10-25 | Disposition: A | Discharge status: Home / Self Care | Payer: BC Managed Care – PPO | Attending: Emergency Medicine | Admitting: Emergency Medicine

## 2011-10-24 DIAGNOSIS — R1032 Left lower quadrant pain: Secondary | ICD-10-CM | POA: Insufficient documentation

## 2011-10-24 DIAGNOSIS — R109 Unspecified abdominal pain: Secondary | ICD-10-CM | POA: Diagnosis not present

## 2011-10-24 DIAGNOSIS — R112 Nausea with vomiting, unspecified: Secondary | ICD-10-CM | POA: Insufficient documentation

## 2011-10-24 DIAGNOSIS — R42 Dizziness and giddiness: Secondary | ICD-10-CM | POA: Diagnosis not present

## 2011-10-24 DIAGNOSIS — I4891 Unspecified atrial fibrillation: Secondary | ICD-10-CM | POA: Diagnosis not present

## 2011-10-24 DIAGNOSIS — N390 Urinary tract infection, site not specified: Secondary | ICD-10-CM | POA: Diagnosis not present

## 2011-10-24 DIAGNOSIS — E039 Hypothyroidism, unspecified: Secondary | ICD-10-CM | POA: Insufficient documentation

## 2011-10-24 DIAGNOSIS — R5381 Other malaise: Secondary | ICD-10-CM | POA: Diagnosis not present

## 2011-10-24 DIAGNOSIS — R404 Transient alteration of awareness: Secondary | ICD-10-CM | POA: Diagnosis not present

## 2011-10-24 DIAGNOSIS — Z79899 Other long term (current) drug therapy: Secondary | ICD-10-CM | POA: Insufficient documentation

## 2011-10-24 LAB — CBC
HCT: 41 % (ref 36.0–46.0)
Hemoglobin: 14.2 g/dL (ref 12.0–15.0)
MCH: 31.6 pg (ref 26.0–34.0)
MCHC: 34.6 g/dL (ref 30.0–36.0)
MCV: 91.3 fL (ref 78.0–100.0)
Platelets: 290 10*3/uL (ref 150–400)
RBC: 4.49 MIL/uL (ref 3.87–5.11)
RDW: 12.2 % (ref 11.5–15.5)
WBC: 8 10*3/uL (ref 4.0–10.5)

## 2011-10-24 LAB — URINALYSIS, ROUTINE W REFLEX MICROSCOPIC
Bilirubin Urine: NEGATIVE
Glucose, UA: NEGATIVE mg/dL
Ketones, ur: 15 mg/dL — AB
Nitrite: NEGATIVE
Protein, ur: NEGATIVE mg/dL
Specific Gravity, Urine: 1.013 (ref 1.005–1.030)
Urobilinogen, UA: 0.2 mg/dL (ref 0.0–1.0)
pH: 6.5 (ref 5.0–8.0)

## 2011-10-24 LAB — COMPREHENSIVE METABOLIC PANEL
ALT: 37 U/L — ABNORMAL HIGH (ref 0–35)
AST: 42 U/L — ABNORMAL HIGH (ref 0–37)
Albumin: 4.3 g/dL (ref 3.5–5.2)
Alkaline Phosphatase: 91 U/L (ref 39–117)
BUN: 12 mg/dL (ref 6–23)
CO2: 21 mEq/L (ref 19–32)
Calcium: 9.8 mg/dL (ref 8.4–10.5)
Chloride: 96 mEq/L (ref 96–112)
Creatinine, Ser: 0.68 mg/dL (ref 0.50–1.10)
GFR calc Af Amer: >90 mL/min (ref >90)
GFR calc non Af Amer: 83 mL/min — ABNORMAL LOW (ref >90)
Glucose, Bld: 119 mg/dL — ABNORMAL HIGH (ref 70–99)
Potassium: 4.4 mEq/L (ref 3.5–5.1)
Sodium: 130 mEq/L — ABNORMAL LOW (ref 135–145)
Total Bilirubin: 0.5 mg/dL (ref 0.3–1.2)
Total Protein: 7.2 g/dL (ref 6.0–8.3)

## 2011-10-24 LAB — DIFFERENTIAL
Basophils Absolute: 0 10*3/uL (ref 0.0–0.1)
Basophils Relative: 1 % (ref 0–1)
Eosinophils Absolute: 0.1 10*3/uL (ref 0.0–0.7)
Eosinophils Relative: 2 % (ref 0–5)
Lymphocytes Relative: 21 % (ref 12–46)
Lymphs Abs: 1.7 10*3/uL (ref 0.7–4.0)
Monocytes Absolute: 0.7 10*3/uL (ref 0.1–1.0)
Monocytes Relative: 9 % (ref 3–12)
Neutro Abs: 5.5 10*3/uL (ref 1.7–7.7)
Neutrophils Relative %: 69 % (ref 43–77)

## 2011-10-24 LAB — URINE MICROSCOPIC-ADD ON

## 2011-10-24 LAB — LACTIC ACID, PLASMA: Lactic Acid, Venous: 1 mmol/L (ref 0.5–2.2)

## 2011-10-24 MED ORDER — ONDANSETRON HCL 4 MG/2ML IJ SOLN
4.0000 mg | Freq: Once | INTRAMUSCULAR | Status: AC
  Administered 2011-10-24: 4 mg via INTRAVENOUS
  Filled 2011-10-24: qty 2

## 2011-10-24 MED ORDER — LORAZEPAM 2 MG/ML IJ SOLN
0.5000 mg | Freq: Once | INTRAMUSCULAR | Status: AC
  Administered 2011-10-24: 0.5 mg via INTRAVENOUS
  Filled 2011-10-24: qty 1

## 2011-10-24 MED ORDER — ONDANSETRON HCL 4 MG/2ML IJ SOLN
4.0000 mg | Freq: Once | INTRAMUSCULAR | Status: AC
  Administered 2011-10-24: 4 mg via INTRAVENOUS

## 2011-10-24 MED ORDER — ONDANSETRON HCL 4 MG/2ML IJ SOLN
INTRAMUSCULAR | Status: AC
  Filled 2011-10-24: qty 2

## 2011-10-24 MED ORDER — SODIUM CHLORIDE 0.9 % IV BOLUS (SEPSIS)
1000.0000 mL | Freq: Once | INTRAVENOUS | Status: AC
  Administered 2011-10-24: 1000 mL via INTRAVENOUS

## 2011-10-24 NOTE — Telephone Encounter (Signed)
Call from patient and she stated she is on Biaxin and Metronidazole and she not has a white tongue and would like something called in for it. Please advise     KP 

## 2011-10-24 NOTE — Telephone Encounter (Signed)
#   15 ; 1 tid  prn only

## 2011-10-24 NOTE — Telephone Encounter (Signed)
Caller: Stephanie Cortez/Patient; PCP: [name].; CB#: [phone];  Call regarding Med ? Wants Appt For 10/25/11. Pt is taking metronidazole for H pylori which she feels is causing her increased dizziness and a white tongue. She requests an appt. Emergent sx r/o. Appt sched for 10/25/11 @ 1200 with Dr. Alwyn Ren. Call back parameters reviewed.

## 2011-10-24 NOTE — ED Notes (Signed)
Patient now c/o headache and neck pain.

## 2011-10-24 NOTE — ED Provider Notes (Addendum)
History     CSN: [id number]  Arrival date & time 10/24/11  2006   First MD Initiated Contact with Patient 10/24/11 2024      Chief Complaint  Patient presents with  . Dizziness  . Chest Pain  . Morning Sickness     HPI The patient presents with nausea, retching and weakness.  She notes that she is associated chest pain with retching, but otherwise no chest pain.  The patient was a generally well until several weeks ago, when she gradually developed left lower quadrant pain.  Approximately 2 weeks the patient had an episode of nausea, vomiting, diarrhea with a worsening of her pain.  She was evaluated by her physician and diagnosed with H. pylori.  Since that time she's been taking Flagyl and clarithromycin.  She notes that she continues to have lower quadrant pain, described as dull, nonradiating, without clear exacerbating or alleviating factors.  She denies any fevers, chills, diarrhea.  She does endorse anorexia with persistent nausea, worse after medication use.  Today, in the hours prior to arrival she developed an acute worsening of her dizziness and nausea with retching.  The patient has a history of intermittent chronic dizziness for which takes meclizine.  His medication has not helped with this episode.  Denies any syncope, near-syncope, confusion, visual changes, ataxia, other abdominal pain. Past Medical History  Diagnosis Date  . Bronchiectasis     >PFT 07/13/2008 in [place]  Fev 1.9L/76%, FVC 2.45L/74, Ratio 79, TLC 121%, DLCO 64%  AE BRonchiectasis - Dec 2010.New Rx:  outpatient - Feb 2011 - Rx outpatient  . Hypothyroidism   . Dyslipidemia   . Eczema   . Anemia     - Hgb 9.7gm% on 07/13/2008 in [place] -  Hgg 129gm% wiht normal irone levsl and ferritin 10/27/2008 in [place] Recurrent otitis/sinusitis  . Atrial fibrillation   . Glaucoma   . Back pain 2009    Past Surgical History  Procedure Date  . Cataract extraction   . Foot surgery 03/14/2011    gastroc slide  . Total  abdominal hysterectomy     Family History  Problem Relation Age of Onset  . Hypertension Mother   . Atrial fibrillation      siblings  . Lung cancer Brother   . Emphysema Brother   . Other Father     miner's lung    History  Substance Use Topics  . Smoking status: Never Smoker   . Smokeless tobacco: Not on file  . Alcohol Use: No    OB History    Grav Para Term Preterm Abortions TAB SAB Ect Mult Living                  Review of Systems  Constitutional:       HPI  HENT:       HPI otherwise negative  Eyes: Negative.   Respiratory:       HPI, otherwise negative  Cardiovascular:       HPI, otherwise nmegative  Gastrointestinal: Positive for vomiting.  Genitourinary:       HPI, otherwise negative  Musculoskeletal:       HPI, otherwise negative  Skin: Negative.   Neurological: Negative for syncope.    Allergies  Moxifloxacin; Pantoprazole sodium; and Penicillins  Home Medications   Current Outpatient Rx  Name Route Sig Dispense Refill  . BUDESONIDE-FORMOTEROL FUMARATE 80-4.5 MCG/ACT IN AERO Inhalation Inhale 2 puffs into the lungs 2 (two) times daily.    [name] Kitchen  CLARITHROMYCIN 500 MG PO TABS Oral Take 1 tablet (500 mg total) by mouth 2 (two) times daily. For 2 weeks 28 tablet 0  . DILTIAZEM HCL ER BEADS 300 MG PO CP24 Oral Take 300 mg by mouth daily.    [name] Kitchen FLUTICASONE FUROATE 27.5 MCG/SPRAY NA SUSP Nasal Place 2 sprays into the nose daily. 10 g 2  . LEVOTHYROXINE SODIUM 50 MCG PO TABS Oral Take 50 mcg by mouth daily.    [name] Kitchen LORAZEPAM 0.5 MG PO TABS Oral Take 0.5 mg by mouth every 8 (eight) hours.    [name] Kitchen MAGNESIUM PO Oral Take by mouth. Takes 2 tbsp daily     . MECLIZINE HCL 25 MG PO TABS Oral Take 25 mg by mouth 3 (three) times daily as needed. For vertigo    . METRONIDAZOLE 500 MG PO TABS Oral Take 1 tablet (500 mg total) by mouth 2 (two) times daily. For 2 weeks 28 tablet 0  . OLMESARTAN MEDOXOMIL 40 MG PO TABS Oral Take 40 mg by mouth daily.    [name] Kitchen OMEPRAZOLE 20 MG PO  CPDR Oral Take 20 mg by mouth daily.      [name] Kitchen POTASSIUM 75 MG PO TABS Oral Take 1 tablet by mouth daily. 1 tab po qd    . PRAVASTATIN SODIUM 20 MG PO TABS Oral Take 20 mg by mouth daily.    . WARFARIN SODIUM 2.5 MG PO TABS Oral Take 1 tablet (2.5 mg total) by mouth daily. 30 tablet 11    BP 158/69  Pulse 75  Temp(Src) 98 F (36.7 C) (Oral)  Resp 16  SpO2 97%  Physical Exam  Nursing note and vitals reviewed. Constitutional: She is oriented to person, place, and time. She appears well-developed and well-nourished. No distress.       On my initial evaluation the patient is sitting upright retching, seems uncomfortable  HENT:  Head: Normocephalic and atraumatic.  Eyes: Conjunctivae and EOM are normal.  Cardiovascular: Normal rate and regular rhythm.   Pulmonary/Chest: Effort normal and breath sounds normal. No stridor. No respiratory distress.  Abdominal: Soft. Normal appearance. She exhibits no distension. There is tenderness in the left lower quadrant.  Musculoskeletal: She exhibits no edema.  Neurological: She is alert and oriented to person, place, and time. No cranial nerve deficit.  Skin: Skin is warm and dry.  Psychiatric: She has a normal mood and affect.    ED Course  Procedures (including critical care time)  Labs Reviewed  COMPREHENSIVE METABOLIC PANEL - Abnormal; Notable for the following:    Sodium 130 (*)    Glucose, Bld 119 (*)    AST 42 (*) HEMOLYSIS AT THIS LEVEL MAY AFFECT RESULT   ALT 37 (*)    GFR calc non Af Amer 83 (*)    All other components within normal limits  URINALYSIS, ROUTINE W REFLEX MICROSCOPIC - Abnormal; Notable for the following:    Hgb urine dipstick SMALL (*)    Ketones, ur 15 (*)    Leukocytes, UA SMALL (*)    All other components within normal limits  URINE MICROSCOPIC-ADD ON - Abnormal; Notable for the following:    Squamous Epithelial / LPF FEW (*)    Casts HYALINE CASTS (*)    All other components within normal limits  CBC    DIFFERENTIAL  LACTIC ACID, PLASMA   No results found.   No diagnosis found.  Cardiac monitor 95 atrial fibrillation abnormal Oximetry 9% room air normal    Date: 10/24/2011  Rate: 91  Rhythm: atrial fibrillation  QRS Axis: normal  Intervals: afib  ST/T Wave abnormalities: nonspecific T wave changes  Conduction Disutrbances:none  Narrative Interpretation:   Old EKG Reviewed: unchanged ABNORMAL but unchanged and typical for this patient   On repeat eval following IVF, ativan / zofran, the patient was substantially improved, MDM  This patient p/w ongoing n/v and LLQ pain.  On initial exam she is quite uncomfortable, but she improved substantially following ED interventions.  The W/U is most suggestive of UTI, with consideration of medication effects as contributing to this new persistent nausea.  (The patient also has a long Hx of intermittent vertigo). The patient's labs, CT scan, vital signs were all reassuring.  The patient was advised to cease both Flagyl and clarithromycin.  She will be provided Bactrim for her urinary tract infection.  She is already scheduled for GI evaluation for this lower abdominal pain.  She was advised to follow up with both his gastroenterologist and her primary care physician.  Gerhard Munch, MD 10/25/11 0041  Gerhard Munch, MD 10/25/11 [phone]

## 2011-10-24 NOTE — Telephone Encounter (Signed)
Patient called & stated she only has 1 & 1/2 left of Meclizine HCl (Tab) she would appreciate a refill today if possible Patient PH# [phone]

## 2011-10-24 NOTE — Telephone Encounter (Signed)
Call from patient and she stated she is on Biaxin and Metronidazole and she not has a white tongue and would like something called in for it. Please advise     KP

## 2011-10-24 NOTE — ED Notes (Addendum)
Patient states she has dizziness and light headedness. Dizziness and vomiting started after she started taking medication to H Pyloric disorder x 2 weeks. Condition worsen today and as arriving to [facility] patient started experiencing pressure in he chest. Patient denies SOB. Patient placed on monitor and sats of 100% on RA. EKG captured on arrival to [facility]. HX of A-fib since 2000.  Family at bedside.

## 2011-10-24 NOTE — ED Notes (Signed)
Radiology made aware of pt's completion of PO contrast x1 hr ago

## 2011-10-24 NOTE — ED Notes (Signed)
Ambulatory to restroom with assistance.

## 2011-10-24 NOTE — ED Notes (Signed)
Per EMS - patient from home. C/o dizziness and generalized weakness started about 1 hour ago with onset of chest pain on arrival to [facility]. BP 210/100. Diagnoses with NORA virus x 1 week ago. Hx A-Fib HR 90's. Denies SOB and 1 episode vomiting.

## 2011-10-25 ENCOUNTER — Encounter (HOSPITAL_COMMUNITY): Payer: Self-pay | Admitting: Radiology

## 2011-10-25 ENCOUNTER — Ambulatory Visit (INDEPENDENT_AMBULATORY_CARE_PROVIDER_SITE_OTHER): Payer: BC Managed Care – PPO | Admitting: Internal Medicine

## 2011-10-25 DIAGNOSIS — R29818 Other symptoms and signs involving the nervous system: Secondary | ICD-10-CM | POA: Diagnosis not present

## 2011-10-25 DIAGNOSIS — K143 Hypertrophy of tongue papillae: Secondary | ICD-10-CM | POA: Diagnosis not present

## 2011-10-25 DIAGNOSIS — E871 Hypo-osmolality and hyponatremia: Secondary | ICD-10-CM

## 2011-10-25 DIAGNOSIS — R7402 Elevation of levels of lactic acid dehydrogenase (LDH): Secondary | ICD-10-CM

## 2011-10-25 DIAGNOSIS — R109 Unspecified abdominal pain: Secondary | ICD-10-CM | POA: Diagnosis not present

## 2011-10-25 DIAGNOSIS — R7401 Elevation of levels of liver transaminase levels: Secondary | ICD-10-CM | POA: Diagnosis not present

## 2011-10-25 MED ORDER — IOHEXOL 300 MG/ML  SOLN
100.0000 mL | Freq: Once | INTRAMUSCULAR | Status: AC | PRN
  Administered 2011-10-25: 100 mL via INTRAVENOUS

## 2011-10-25 MED ORDER — CEPHALEXIN 250 MG PO CAPS
500.0000 mg | ORAL_CAPSULE | Freq: Once | ORAL | Status: AC
  Administered 2011-10-25: 500 mg via ORAL
  Filled 2011-10-25: qty 2

## 2011-10-25 MED ORDER — MAGIC MOUTHWASH: ORAL | Status: DC

## 2011-10-25 MED ORDER — LORAZEPAM 0.5 MG PO TABS: 0.5000 mg | ORAL_TABLET | Freq: Three times a day (TID) | ORAL | Status: DC | PRN

## 2011-10-25 MED ORDER — SULFAMETHOXAZOLE-TRIMETHOPRIM 800-160 MG PO TABS: 1.0000 | ORAL_TABLET | Freq: Two times a day (BID) | ORAL | Status: DC

## 2011-10-25 MED ORDER — MECLIZINE HCL 25 MG PO TABS: 25.0000 mg | ORAL_TABLET | Freq: Three times a day (TID) | ORAL | Status: DC | PRN

## 2011-10-25 MED ORDER — CEPHALEXIN 500 MG PO CAPS: 500.0000 mg | ORAL_CAPSULE | Freq: Two times a day (BID) | ORAL | Status: AC

## 2011-10-25 MED ORDER — SULFAMETHOXAZOLE-TMP DS 800-160 MG PO TABS: 1.0000 | ORAL_TABLET | Freq: Once | ORAL | Status: DC

## 2011-10-25 NOTE — Discharge Instructions (Signed)

## 2011-10-25 NOTE — Progress Notes (Signed)
Subjective:    Patient ID: Stephanie Cortez, female    DOB: October 15, 1933, 78 y.o.   MRN: [id number]  HPI 3 weeks ago - pt diagnosed with H. Pylori - pt received clindamycin and generic of flagyl and was told to take 2 Prilosec tablets instead of 1 per day like her usual dosage. Pt took the clindamycin and flagyl for about 11 days as directed. Approximately after the first two doses of medication clindamycin and flagyl however, pt began having vomiting and diarrhea. Symptoms were first assocaited with the medication but later when the rest of the family had similar symptoms, the pt associated the vomiting and diarrhea with the norovirus and continued to take the medication. The vomiting and diarrhea began on the Monday night after the first doses of the new medications and lasted until 11am tuesday morning.  Pt has a history of dizziness and takes Meclizine as needed to help with dizziness. Pt ran out of this medication so she called yesterday to get a refill because she was having dizziness.  For approximately one week after the vomiting and diarrhea subsided, Pt began feeling weak, having tingling sensations in bilateral lower extremities, mild blurred vision, lack of energy, dizziness, nausea, headache, feverish, and some dry heaving that all increased in intensity yesterday that caused the patient to call 911 at 530 pm last night. Once at the hospital, MD changed the patients medications to keflex and pt received first dose at hospital and was given a prescription which the pt plans on filling today. At ED last night, UTI was also diagnosed.  Pt here today to share this information with her primary care provider. Pt also hear to make sure keflex is a good choice of a medication for her to be taking. Pt would like clindamycin and flagyl noted as an intolerance on her allergy list so she is not given these medications again. Pt also has noticed some white coloring and build up on her tongue that the MD told  the patient was thrush but did not prescribe any medication for it.  She previously was on a diuretic but this was discontinued because of hypokalemia. Her sodium was 130 in the emergency room 4/24. There is minimal elevation in her AST and ALT as well at 42 and 37. The remainder of the labs were normal except for mild elevation of  sugar @119  ( nonfasting)   Review of Systems Pt has an appointment prior to her colonoscopy on May 7th. Pt was referred for this colonoscopy due to possible diverticulitis and the recent H pylori diagnosis. Pt received a limited prescription for Ativan and Meclizine. She inquires as to whether the Ativan and meclizine could be made maintenance medications.     She is having some delay in urination; she denies dysuria, pyuria, hematuria                                                       Objective:   Physical Exam General appearance is one of good health and nourishment w/o distress.Appears younger than stated age   Eyes: No conjunctival inflammation or scleral icterus is present. Extraocular motion intact. No nystagmus present  Oral exam: Dental hygiene is good; lips and gums are healthy appearing.There is no oropharyngeal erythema or exudate noted. The tongue is coated but she does not  have frank Candida  Heart:   heart rhythm is essentially regular with only minimal intermittent dysrrhythmia.      Lungs:Chest clear to auscultation; no wheezes, rhonchi,rales ,or rubs present.No increased work of breathing.   Abdomen: bowel sounds normal, soft and non-tender without masses, organomegaly or hernias noted.  No guarding or rebound   Skin:Warm & dry.  Intact without suspicious lesions or rashes ; no jaundice ; minimal tenting  Lymphatic: No lymphadenopathy is noted about the head, neck, axilla   Neuropsych: Alert and oriented x3       Assessment & Plan:    #1 chronic, recurrent balance issues. Meclizine as needed only is appropriate.  #2 tongue  coating  without frank Candida  #3 possible urinary tract infection; cultures and sensitivities pending. I asked her to hold the Keflex until the urine sensitivities returned. She's had significant amount of antibiotics recently and does not clinically have uncontrolled urinary tract infection  #4 mild hyponatremia and mild elevation of liver function test  Plan: See orders and recommendations

## 2011-10-25 NOTE — Progress Notes (Signed)
Addended by: Arnette Norris on: 10/25/2011 02:42 PM   Modules accepted: Orders

## 2011-10-25 NOTE — Patient Instructions (Addendum)
The low sodium (hyponatremia ) can be due to excess fluid intake (dilutional)  or medication effect (Ex diuretics such as Hydrochlorothiazide) . Please see me to discuss options before refilling the diuretic. Mild elevation of liver enzyme test; avoid excess Tylenol, alcohol & vitamin A. Recheck fasting labs in 2 weeks : BMET, AST, ALT.PLEASE BRING THESE INSTRUCTIONS TO FOLLOW UP  LAB APPOINTMENT.This will guarantee correct labs are drawn, eliminating need for repeat blood sampling ( needle sticks ! ). Diagnoses /Codes:hyponatremia, 790.4  Please try to go on My Chart within the next 24 hours to allow me to release the results directly to you.     Magic mouthwash 5 cc (1 teaspoon) gargled well  and swallowed  3 times a day

## 2011-10-26 ENCOUNTER — Ambulatory Visit (INDEPENDENT_AMBULATORY_CARE_PROVIDER_SITE_OTHER): Payer: BC Managed Care – PPO | Admitting: Pharmacist

## 2011-10-26 DIAGNOSIS — I4891 Unspecified atrial fibrillation: Secondary | ICD-10-CM | POA: Diagnosis not present

## 2011-10-26 LAB — POCT INR: INR: 6

## 2011-10-26 LAB — PROTIME-INR
INR: 6 ratio (ref 0.8–1.0)
Prothrombin Time: 67 s (ref 10.2–12.4)

## 2011-10-30 ENCOUNTER — Encounter: Payer: Self-pay | Admitting: Internal Medicine

## 2011-10-30 ENCOUNTER — Ambulatory Visit (INDEPENDENT_AMBULATORY_CARE_PROVIDER_SITE_OTHER): Payer: BC Managed Care – PPO | Admitting: Internal Medicine

## 2011-10-30 DIAGNOSIS — J479 Bronchiectasis, uncomplicated: Secondary | ICD-10-CM

## 2011-10-30 NOTE — Patient Instructions (Signed)
Currently lungs appear ok Our nurse will have you see Dr in [facility] asap for sinus  Issues Take care of your gi issues REturn in 6 months or sooner if needed Please continue with your medications  #Overweight  - Victorino Dike will show you the [facility] lipid diet sheet and how to use it  #Followup  - 6 - 9 months or sooner if needed  - spirometry at followup

## 2011-10-30 NOTE — Progress Notes (Signed)
Subjective:    Patient ID: Stephanie Cortez, female    DOB: 11/26/1933, 78 y.o.   MRN: [id number]  HPI 78 yo WF with known hx of Bronchietasis  Follouwp Bronchiectasis. Last ollowup Bronchiectasis. Last seen August 2010, Dec 2010, Jan 2011, FEb 2011 (AE bronciectasis), and  Oct 2011. S/p pulmonary rehab in summer 2011.  Since then had one more visit in Nov 2011 for  AE bronchiectasis. Since then doing well. Denies cough or dyspnea but feels chest is just a bit more tight than usual. . No other specific complaints.  Denies chest pain, dyspnea, orthopnea, hemoptysis, fever, n/v/d, edema, headache.  However, does admit to increased fatigue  + . Uses symbicort as needed which is around 2-3 times per week. Is requesting spirometry  >>rx  symbicort daily    OV 04/12/11:  Using symbicort prn and overall doing well. Only has minimal wheeze. She is anticipatory and pro-acitve in care. When sinus congestion develops uses netti pot and lemon-honey vapors for relief. This prevents wheezing. Typically winter is when she has AE bronchiectasis. Wants to know if she should take prn symbicort or go on daily.  >>no changes   05/23/2011 Acute OV  Complains of head congestion, clear mucus production, PND, chest congestion, cough x1week .  Taking mucinex with some help. Mucus is all clear. No fever or discolored mucus.  Worried that she will be sick over the holidays and she is leaving to go out of town.  She is using netti pot with good relief. She has increased her symbicort Twice daily  With decreased cough.  >>rx doxycycline  11/29 /2012 Follow up  Pt returns for follow up . She is feeling is better. Currently  taking doxycycline.  reports breathing has improved but still having sinuscongestion and drainage. Last ov she was given Augmentin -unfortunately has hx of PCN allergy and abx was changed prior to taking. She is tolerating abx well. Congestion and cough are much better.  No fever or chest pain.  >>No  changes   06/19/2011 Acute OV Complains of wheezing, head congestion with green drainage, hacky cough x3-4days. OTC not helping. Using Netti pot rinses .  No hemotpysis . Worse in am and late at night.  Cough is getting  Worse. Does not want to be sick for the holidays-has too much to do.  Under a lot of stress with sister that is ill. Traveling back and forth to [place].     Past Med: has changed PMD to Dr Laury Axon.  Social: taking care of elder sister in her 9s who has now become dependent Fam hx: as in soccial. No other change   REC Continue on Symbicort 2 puffs Twice daily Until back to your baseline  Mucinex Twice daily As needed Cough/congestion  Fluids and rest  I have given you a prescription for antibiotic--Zpack To have on hold in case  You developed discolored mucus that is not improving.  Saline nasal rinses As needed  Please contact office for sooner follow up if symptoms do not improve or worsen or seek emergency care  follow up Dr. Marchelle Gearing as planned and As needed   OV 08/01/2011  D 23 of 28 biaxin. Blocked sinus. Sinus pressure. Hx of eustachian tube grommets. No resp issues. Netti pot not working - too blocked. Denies fever, sputum, resp exacerbation, wheeze, edema, orthopnea, paroxysmal nocturnal dyspnea    Currently lungs appear ok  Our coordinator will find out who saaw you in [facility]. We will  try to have you seen this week  REturn in 3 months or sooner if needed  Please continue with your medications  OV 10/30/2011  Sinus drainage copious - intolerant to netti pot  - burns, does not want nasal steroids or anti histamine. Wants to go back to ENT. This is making tickle in throat that is severe x 1 week with mucus stuck in throat and gag. CT abdomen lung cut April 2013 for abdominal pain shows clear lung fields with some basal atelectasis. Do not see any basal bronchiectasis  Past, Family, Social reviewed: gi issues, gi scope pending   Review of Systems    Constitutional: Negative for fever and unexpected weight change.  HENT: Positive for congestion and sinus pressure. Negative for ear pain, nosebleeds, sore throat, rhinorrhea, sneezing, trouble swallowing, dental problem and postnasal drip.   Eyes: Negative for redness and itching.  Respiratory: Positive for shortness of breath. Negative for cough, chest tightness and wheezing.   Cardiovascular: Negative for palpitations and leg swelling.  Gastrointestinal: Negative for nausea and vomiting.  Genitourinary: Negative for dysuria.  Musculoskeletal: Negative for joint swelling.  Skin: Negative for rash.  Neurological: Negative for headaches.  Hematological: Does not bruise/bleed easily.  Psychiatric/Behavioral: Negative for dysphoric mood. The patient is not nervous/anxious.        Objective:   Physical Exam GEN: A/Ox3; pleasant , NAD, well nourished   HEENT:  [place]/AT,  EACs-clear, TMs-wnl, NOSE-clear mucus,, THROAT-clear, no lesions, no postnasal drip or exudate noted.  LEFT TURBINATE SWOLLEN  NECK:  Supple w/ fair ROM; no JVD; normal carotid impulses w/o bruits; no thyromegaly or nodules palpated; no lymphadenopathy.  RESP  Coarse BS ; w/o, wheezes/ rales/ or rhonchi.no accessory muscle use, no dullness to percussion  CARD:  RRR, no m/r/g  , no peripheral edema, pulses intact, no cyanosis or clubbing.  GI:   Soft & nt; nml bowel sounds; no organomegaly or masses detected.  Musco: Warm bil, no deformities or joint swelling noted.   Neuro: alert, no focal deficits noted.    Skin: Warm, no lesions or rashes         Assessment & Plan:

## 2011-10-30 NOTE — Assessment & Plan Note (Signed)
Currently lungs appear ok Our nurse will have you see Dr in [facility] asap for sinus  Issues Take care of your gi issues REturn in 6 months or sooner if needed Please continue with your medications Might get ct chest in future to see extent of bronchiectasis because I do not see any on CT abdomen lung cut April 2013  #Overweight  - Victorino Dike will show you the [facility] lipid diet sheet and how to use it  #Followup  - 6 - 9 months or sooner if needed  - spirometry at followup

## 2011-11-02 ENCOUNTER — Ambulatory Visit (INDEPENDENT_AMBULATORY_CARE_PROVIDER_SITE_OTHER): Payer: BC Managed Care – PPO | Admitting: Pharmacist

## 2011-11-02 DIAGNOSIS — R1310 Dysphagia, unspecified: Secondary | ICD-10-CM | POA: Diagnosis not present

## 2011-11-02 DIAGNOSIS — I4891 Unspecified atrial fibrillation: Secondary | ICD-10-CM | POA: Diagnosis not present

## 2011-11-02 LAB — POCT INR: INR: 1.9

## 2011-11-06 ENCOUNTER — Ambulatory Visit (INDEPENDENT_AMBULATORY_CARE_PROVIDER_SITE_OTHER): Payer: BC Managed Care – PPO | Admitting: Gastroenterology

## 2011-11-06 ENCOUNTER — Encounter: Payer: Self-pay | Admitting: Gastroenterology

## 2011-11-06 VITALS — BP 124/62 | HR 72 | Ht 67.0 in | Wt 187.0 lb

## 2011-11-06 DIAGNOSIS — K625 Hemorrhage of anus and rectum: Secondary | ICD-10-CM

## 2011-11-06 DIAGNOSIS — R109 Unspecified abdominal pain: Secondary | ICD-10-CM

## 2011-11-06 DIAGNOSIS — K219 Gastro-esophageal reflux disease without esophagitis: Secondary | ICD-10-CM

## 2011-11-06 DIAGNOSIS — K5730 Diverticulosis of large intestine without perforation or abscess without bleeding: Secondary | ICD-10-CM

## 2011-11-06 MED ORDER — MOVIPREP 100 G PO SOLR: 1.0000 | Freq: Once | ORAL | Status: DC

## 2011-11-06 NOTE — Progress Notes (Signed)
History of Present Illness:  This is a very complicated and very verbose 78 year old Caucasian female with chronic bronchiectasis followed by pulmonary, chronic GERD on PPI therapy, well-controlled atrial fibrillation, chronic hypothyroidism, who is referred for evaluation of a fairly positive GI review of systems. She apparently had left lower quadrant pain one month ago which responded to by mouth antibiotic therapy, but she developed allegedly"norovirus" infection and had to discontinue these antibiotics, and apparently has been in the emergency room for rehydration. Her left lower quadrant pain resolved, and she allegedly was treated at some point for" H. pylori infection". She has chronic dyspepsia and acid reflux symptoms, and is on chronic PPI therapy without associated dysphagia or any specific hepatobiliary complaints, history of hepatitis or pancreatitis. Her left lower quadrant pain seems to have resolved, and she has regular bowel movements without melena or hematochezia. She had endoscopy and colonoscopy perhaps 10 years ago. Family history is noncontributory in terms of gastrointestinal issues except for younger sister that may have had pancreatic cancer. Patient denies any specific food intolerances. She had one episode of rectal bleeding 6-8 weeks ago. Recent CT scan of the abdomen x2 have been reviewed and shows left colon diverticulosis without evidence of diverticulitis. Apparently the patient also has recurrent urinary tract infections, has recently completed another unknown antibiotic. She is chronically on Coumadin 2.5 mg a day and multiple other medications listed and reviewed her chart. She also has a history of multiple drug allergies.  I have reviewed this patient's present history, medical and surgical past history, allergies and medications.     ROS: The remainder of the 10 point ROS is negative... she complains of allergic sinusitis, chronic low back pain, possible glaucoma,  chronic nonproductive cough, chronic fatigue, sinus headaches, shortness of breath with exertion, and periodic peripheral edema. She has had previous hysterectomy.     Physical Exam: Healthy-appearing patient in no distress. Blood pressure 124 but 62, pulse 72 and irregular, and weight 187 pounds with a BMI of 29.29. General well developed well nourished patient in no acute distress, appearing her stated age Eyes PERRLA, no icterus, fundoscopic exam per opthamologist Skin no lesions noted Neck supple, no adenopathy, no thyroid enlargement, no tenderness Chest clear to percussion and auscultation, irregular well controlled rhythm. Heart no significant murmurs, gallops or rubs noted Abdomen no hepatosplenomegaly masses or tenderness, BS normal.  Rectal inspection normal no fissures, or fistulae noted.  No masses or tenderness on digital exam. Stool guaiac negative. Extremities no acute joint lesions, edema, phlebitis or evidence of cellulitis. Neurologic patient oriented x 3, cranial nerves intact, no focal neurologic deficits noted. Psychological mental status normal and normal affect.  Assessment and plan: Complicated patient with multiple complaints. She is very verbose and is hard to sort out many of her difficulties. GI-wise, it seems that she may have had subacute diverticulitis which has resolved. Other considerations would be acute ischemic colitis which has resolved; associated with her chronic atrial fibrillation. However, she denies any active cardiovascular issues. She has not had colonoscopy in over 10 years which has been scheduled at her convenience, and I think wecan hold her Coumadin 5 days before this procedure. Also because of her chronic acid reflux, we will obtain endoscopic exam and evaluation for H. pylori at that time. I have asked her to continue her Prilosec 20 mg a day. Her bronchiectasis seems to be under fairly good control, and she frequently is on multiple, multiple  antibiotics over the last several years. She has  no current symptoms of bacterial overgrowth or C. difficile infection. She gives a history of severe reactions in the past to beta blocker therapy. We will have nurse anesthesia present to provide propofol anesthesia  for her endoscopic procedures with close cardiopulmonary monitorzation.  Encounter Diagnoses  Name Primary?  . Abdominal  pain, other specified site Yes  . Rectal bleeding

## 2011-11-06 NOTE — Patient Instructions (Signed)
Your procedure has been scheduled for 11/14/2011, please follow the seperate instructions.  Your prescription(s) have been sent to you pharmacy.

## 2011-11-07 ENCOUNTER — Other Ambulatory Visit: Payer: Self-pay | Admitting: Family Medicine

## 2011-11-07 DIAGNOSIS — R7402 Elevation of levels of lactic acid dehydrogenase (LDH): Secondary | ICD-10-CM

## 2011-11-08 ENCOUNTER — Other Ambulatory Visit: Payer: Self-pay | Admitting: Internal Medicine

## 2011-11-08 ENCOUNTER — Other Ambulatory Visit (INDEPENDENT_AMBULATORY_CARE_PROVIDER_SITE_OTHER): Payer: BC Managed Care – PPO

## 2011-11-08 DIAGNOSIS — E039 Hypothyroidism, unspecified: Secondary | ICD-10-CM | POA: Diagnosis not present

## 2011-11-08 DIAGNOSIS — R7401 Elevation of levels of liver transaminase levels: Secondary | ICD-10-CM | POA: Diagnosis not present

## 2011-11-08 DIAGNOSIS — R7402 Elevation of levels of lactic acid dehydrogenase (LDH): Secondary | ICD-10-CM

## 2011-11-08 LAB — BASIC METABOLIC PANEL
BUN: 10 mg/dL (ref 6–23)
CO2: 24 mEq/L (ref 19–32)
Calcium: 9.3 mg/dL (ref 8.4–10.5)
Chloride: 101 mEq/L (ref 96–112)
Creatinine, Ser: 0.7 mg/dL (ref 0.4–1.2)
GFR: 92.48 mL/min (ref >60.00)
Glucose, Bld: 78 mg/dL (ref 70–99)
Potassium: 4.1 mEq/L (ref 3.5–5.1)
Sodium: 134 mEq/L — ABNORMAL LOW (ref 135–145)

## 2011-11-08 LAB — ALT: ALT: 22 U/L (ref 0–35)

## 2011-11-08 LAB — TSH: TSH: 2.84 u[IU]/mL (ref 0.35–5.50)

## 2011-11-08 LAB — AST: AST: 27 U/L (ref 0–37)

## 2011-11-08 NOTE — Progress Notes (Signed)
Lab only 

## 2011-11-13 DIAGNOSIS — J310 Chronic rhinitis: Secondary | ICD-10-CM | POA: Diagnosis not present

## 2011-11-13 DIAGNOSIS — J479 Bronchiectasis, uncomplicated: Secondary | ICD-10-CM | POA: Diagnosis not present

## 2011-11-14 ENCOUNTER — Ambulatory Visit (AMBULATORY_SURGERY_CENTER): Payer: BC Managed Care – PPO | Admitting: Gastroenterology

## 2011-11-14 ENCOUNTER — Encounter: Payer: Self-pay | Admitting: Gastroenterology

## 2011-11-14 VITALS — BP 138/91 | HR 88 | Temp 98.0°F | Resp 16 | Ht 67.0 in | Wt 187.0 lb

## 2011-11-14 DIAGNOSIS — Z8719 Personal history of other diseases of the digestive system: Secondary | ICD-10-CM

## 2011-11-14 DIAGNOSIS — R109 Unspecified abdominal pain: Secondary | ICD-10-CM

## 2011-11-14 DIAGNOSIS — K625 Hemorrhage of anus and rectum: Secondary | ICD-10-CM

## 2011-11-14 DIAGNOSIS — K5730 Diverticulosis of large intestine without perforation or abscess without bleeding: Secondary | ICD-10-CM | POA: Diagnosis not present

## 2011-11-14 DIAGNOSIS — F411 Generalized anxiety disorder: Secondary | ICD-10-CM | POA: Diagnosis not present

## 2011-11-14 DIAGNOSIS — K219 Gastro-esophageal reflux disease without esophagitis: Secondary | ICD-10-CM

## 2011-11-14 MED ORDER — SODIUM CHLORIDE 0.9 % IV SOLN: 500.0000 mL | INTRAVENOUS | Status: DC

## 2011-11-14 NOTE — Progress Notes (Signed)
Patient did not experience any of the following events: a burn prior to discharge; a fall within the facility; wrong site/side/patient/procedure/implant event; or a hospital transfer or hospital admission upon discharge from the facility. (G8907) Patient did not have preoperative order for IV antibiotic SSI prophylaxis. (G8918)  

## 2011-11-14 NOTE — Op Note (Signed)
[facility] [address] [place], [place]  [address]  ENDOSCOPY PROCEDURE REPORT  PATIENT:  Stephanie, Cortez  MR#:  [id number] BIRTHDATE:  [birth year], 78 yrs. old  GENDER:  female  ENDOSCOPIST:  Vania Rea. Jarold Motto, MD, [facility] Referred by:  PROCEDURE DATE:  11/14/2011 PROCEDURE:  Esophagoscopy ASA CLASS:  Class II INDICATIONS:  CHRONIC GERD  MEDICATIONS:   propofol (Diprivan) 100 mg IV TOPICAL ANESTHETIC:  DESCRIPTION OF PROCEDURE:   After the risks and benefits of the procedure were explained, informed consent was obtained.  The LB GIF-H180 [id number] endoscope was introduced through the mouth and advanced to the pharynx.  The instrument was slowly withdrawn as the mucosa was fully examined.  stenosis. COULD NOT ADVANCE SCOPE BEYOND PHARYNX.    Retroflexion was not performed.  The scope was then withdrawn from the patient and the procedure completed.  COMPLICATIONS:  None  ENDOSCOPIC IMPRESSION: 1) Stenosis ?? CERVICAL SPURS OR ZENKER'S DIVERTICULUM. RECOMMENDATIONS: BARIUM SWALLOW EXAM  ______________________________ Vania Rea. Jarold Motto, MD, [facility]  CC:  [name], DO  n. eSIGNED:   Vania Rea. Kayd Launer at 11/14/2011 02:13 PM  Cecile Sheerer, [phone]

## 2011-11-14 NOTE — Patient Instructions (Signed)
discharge instructions given with verbal understanding. Handouts on diverticulosis and a high fiber diet given. Resume previous medications.YOU HAD AN ENDOSCOPIC PROCEDURE TODAY AT THE [facility]: Refer to the procedure report that was given to you for any specific questions about what was found during the examination.  If the procedure report does not answer your questions, please call your gastroenterologist to clarify.  If you requested that your care partner not be given the details of your procedure findings, then the procedure report has been included in a sealed envelope for you to review at your convenience later.  YOU SHOULD EXPECT: Some feelings of bloating in the abdomen. Passage of more gas than usual.  Walking can help get rid of the air that was put into your GI tract during the procedure and reduce the bloating. If you had a lower endoscopy (such as a colonoscopy or flexible sigmoidoscopy) you may notice spotting of blood in your stool or on the toilet paper. If you underwent a bowel prep for your procedure, then you may not have a normal bowel movement for a few days.  DIET: Your first meal following the procedure should be a light meal and then it is ok to progress to your normal diet.  A half-sandwich or bowl of soup is an example of a good first meal.  Heavy or fried foods are harder to digest and may make you feel nauseous or bloated.  Likewise meals heavy in dairy and vegetables can cause extra gas to form and this can also increase the bloating.  Drink plenty of fluids but you should avoid alcoholic beverages for 24 hours.  ACTIVITY: Your care partner should take you home directly after the procedure.  You should plan to take it easy, moving slowly for the rest of the day.  You can resume normal activity the day after the procedure however you should NOT DRIVE or use heavy machinery for 24 hours (because of the sedation medicines used during the test).    SYMPTOMS TO  REPORT IMMEDIATELY: A gastroenterologist can be reached at any hour.  During normal business hours, 8:30 AM to 5:00 PM Monday through Friday, call [phone].  After hours and on weekends, please call the GI answering service at [phone] who will take a message and have the physician on call contact you.   Following lower endoscopy (colonoscopy or flexible sigmoidoscopy):  Excessive amounts of blood in the stool  Significant tenderness or worsening of abdominal pains  Swelling of the abdomen that is new, acute  Fever of 100F or higher  Following upper endoscopy (EGD)  Vomiting of blood or coffee ground material  New chest pain or pain under the shoulder blades  Painful or persistently difficult swallowing  New shortness of breath  Fever of 100F or higher  Black, tarry-looking stools  FOLLOW UP: If any biopsies were taken you will be contacted by phone or by letter within the next 1-3 weeks.  Call your gastroenterologist if you have not heard about the biopsies in 3 weeks.  Our staff will call the home number listed on your records the next business day following your procedure to check on you and address any questions or concerns that you may have at that time regarding the information given to you following your procedure. This is a courtesy call and so if there is no answer at the home number and we have not heard from you through the emergency physician on call, we will assume  that you have returned to your regular daily activities without incident.  SIGNATURES/CONFIDENTIALITY: You and/or your care partner have signed paperwork which will be entered into your electronic medical record.  These signatures attest to the fact that that the information above on your After Visit Summary has been reviewed and is understood.  Full responsibility of the confidentiality of this discharge information lies with you and/or your care-partner.

## 2011-11-14 NOTE — Op Note (Signed)
[facility] [address] [place], [place]  [address]  COLONOSCOPY PROCEDURE REPORT  PATIENT:  Stephanie Cortez, Stephanie Cortez  MR#:  [id number] BIRTHDATE:  [birth year], 78 yrs. old  GENDER:  female ENDOSCOPIST:  Vania Rea. Jarold Motto, MD, [facility] REF. BY: PROCEDURE DATE:  11/14/2011 PROCEDURE:  Average-risk screening colonoscopy G0121 ASA CLASS:  Class II INDICATIONS:  CRC Screening abd pain,gas and bloating MEDICATIONS:   propofol (Diprivan) 150 mg IV  DESCRIPTION OF PROCEDURE:   After the risks and benefits and of the procedure were explained, informed consent was obtained. Digital rectal exam was performed and revealed no abnormalities. The LB CF-H180AL [id number] endoscope was introduced through the anus and advanced to the cecum, which was identified by both the appendix and ileocecal valve.  The quality of the prep was excellent, using MoviPrep.  The instrument was then slowly withdrawn as the colon was fully examined. <<PROCEDUREIMAGES>>  FINDINGS:  Moderate diverticulosis was found in the sigmoid to descending colon segments.   Retroflexed views in the rectum revealed no abnormalities.    The scope was then withdrawn from the patient and the procedure completed.  COMPLICATIONS:  None ENDOSCOPIC IMPRESSION: 1) Moderate diverticulosis in the sigmoid to descending colon segments IBS AND DIVERTICULOSIS RECOMMENDATIONS: 1) High fiber diet with liberal fluid intake.  REPEAT EXAM:  No  ______________________________ Vania Rea. Jarold Motto, MD, [facility]  CC:  [name], DO  n. eSIGNED:   Vania Rea. Marvette Schamp at 11/14/2011 02:07 PM  Cecile Sheerer, [phone]

## 2011-11-15 ENCOUNTER — Telehealth: Payer: Self-pay | Admitting: *Deleted

## 2011-11-15 NOTE — Telephone Encounter (Signed)
  Follow up Call-  Call back number 11/14/2011  Post procedure Call Back phone  # [phone]  Permission to leave phone message Yes     Patient questions:  Do you have a fever, pain , or abdominal swelling? no Pain Score  0 *  Have you tolerated food without any problems? yes  Have you been able to return to your normal activities? yes  Do you have any questions about your discharge instructions: Diet   no Medications  no Follow up visit  no  Do you have questions or concerns about your Care? no  Actions: * If pain score is 4 or above: No action needed, pain <4.

## 2011-11-16 ENCOUNTER — Telehealth: Payer: Self-pay | Admitting: *Deleted

## 2011-11-16 ENCOUNTER — Ambulatory Visit (INDEPENDENT_AMBULATORY_CARE_PROVIDER_SITE_OTHER): Payer: BC Managed Care – PPO | Admitting: Pharmacist

## 2011-11-16 ENCOUNTER — Other Ambulatory Visit: Payer: Self-pay | Admitting: *Deleted

## 2011-11-16 DIAGNOSIS — K219 Gastro-esophageal reflux disease without esophagitis: Secondary | ICD-10-CM

## 2011-11-16 DIAGNOSIS — I4891 Unspecified atrial fibrillation: Secondary | ICD-10-CM

## 2011-11-16 DIAGNOSIS — K222 Esophageal obstruction: Secondary | ICD-10-CM

## 2011-11-16 LAB — POCT INR: INR: 1.3

## 2011-11-16 MED ORDER — MAGIC MOUTHWASH: ORAL | Status: DC

## 2011-11-16 NOTE — Telephone Encounter (Signed)
Pt left VM requesting refill on MAGIC MOUTHWASH,last OV, last filled 4.25-13. [name] KitchenPlease advise

## 2011-11-16 NOTE — Telephone Encounter (Signed)
lmom for pt and daughter, Forde Radon to call back.

## 2011-11-16 NOTE — Telephone Encounter (Signed)
Refill x1 

## 2011-11-16 NOTE — Telephone Encounter (Signed)
Rx faxed.    KP 

## 2011-11-16 NOTE — Telephone Encounter (Signed)
Spoke with pt to inform her of BS  appt and instructions for 11/21/11 arrive at 10:15am at [facility]; there is no prep. Pt stated understanding.

## 2011-11-21 ENCOUNTER — Ambulatory Visit (HOSPITAL_COMMUNITY)
Admission: RE | Admit: 2011-11-21 | Discharge: 2011-11-21 | Disposition: A | Discharge status: Home / Self Care | Payer: BC Managed Care – PPO | Source: Ambulatory Visit | Attending: Gastroenterology | Admitting: Gastroenterology

## 2011-11-21 DIAGNOSIS — R1310 Dysphagia, unspecified: Secondary | ICD-10-CM | POA: Insufficient documentation

## 2011-11-21 DIAGNOSIS — K222 Esophageal obstruction: Secondary | ICD-10-CM

## 2011-11-21 DIAGNOSIS — Q391 Atresia of esophagus with tracheo-esophageal fistula: Secondary | ICD-10-CM | POA: Insufficient documentation

## 2011-11-21 DIAGNOSIS — K219 Gastro-esophageal reflux disease without esophagitis: Secondary | ICD-10-CM

## 2011-11-22 ENCOUNTER — Telehealth: Payer: Self-pay | Admitting: *Deleted

## 2011-11-22 DIAGNOSIS — Q394 Esophageal web: Secondary | ICD-10-CM

## 2011-11-22 NOTE — Telephone Encounter (Signed)
Per Dr Jarold Motto pt needs a appt with Dr Dillard Cannon for esophageal Web I have called Dr Butch Penny office but they are closed for lunch so i will call back after lunch.

## 2011-11-22 NOTE — Telephone Encounter (Signed)
Message copied by Leonette Monarch on Thu Nov 22, 2011 11:38 AM ------      Message from: Harlow Mares D      Created: Wed Nov 21, 2011  4:37 PM       Refer to Dr Dillard Cannon for webbed esoph

## 2011-11-23 NOTE — Telephone Encounter (Signed)
pts appt 11/28/2011 9 am pt aware and records faxed.

## 2011-11-28 ENCOUNTER — Ambulatory Visit (INDEPENDENT_AMBULATORY_CARE_PROVIDER_SITE_OTHER): Payer: BC Managed Care – PPO | Admitting: Pharmacist

## 2011-11-28 ENCOUNTER — Other Ambulatory Visit: Payer: Self-pay | Admitting: Family Medicine

## 2011-11-28 DIAGNOSIS — I4891 Unspecified atrial fibrillation: Secondary | ICD-10-CM

## 2011-11-28 LAB — POCT INR: INR: 2.2

## 2011-11-29 ENCOUNTER — Other Ambulatory Visit: Payer: Self-pay | Admitting: Family Medicine

## 2011-11-29 NOTE — Telephone Encounter (Signed)
Last seen 10/08/11 and filled 09/27/11 # [phone]. Please advise    KP

## 2011-12-06 ENCOUNTER — Encounter (HOSPITAL_BASED_OUTPATIENT_CLINIC_OR_DEPARTMENT_OTHER): Payer: Self-pay | Admitting: *Deleted

## 2011-12-06 NOTE — Progress Notes (Signed)
To come in for bmet-pt,ptt

## 2011-12-10 ENCOUNTER — Encounter (HOSPITAL_BASED_OUTPATIENT_CLINIC_OR_DEPARTMENT_OTHER)
Admission: RE | Admit: 2011-12-10 | Discharge: 2011-12-10 | Disposition: A | Discharge status: Home / Self Care | Payer: BC Managed Care – PPO | Source: Ambulatory Visit | Attending: Otolaryngology | Admitting: Otolaryngology

## 2011-12-10 LAB — BASIC METABOLIC PANEL
BUN: 7 mg/dL (ref 6–23)
CO2: 26 mEq/L (ref 19–32)
Calcium: 9.9 mg/dL (ref 8.4–10.5)
Chloride: 96 mEq/L (ref 96–112)
Creatinine, Ser: 0.68 mg/dL (ref 0.50–1.10)
GFR calc Af Amer: >90 mL/min (ref >90)
GFR calc non Af Amer: 83 mL/min — ABNORMAL LOW (ref >90)
Glucose, Bld: 82 mg/dL (ref 70–99)
Potassium: 5.8 mEq/L — ABNORMAL HIGH (ref 3.5–5.1)
Sodium: 128 mEq/L — ABNORMAL LOW (ref 135–145)

## 2011-12-10 LAB — PROTIME-INR
INR: 1.34 (ref 0.00–1.49)
Prothrombin Time: 16.8 seconds — ABNORMAL HIGH (ref 11.6–15.2)

## 2011-12-10 LAB — APTT: aPTT: 40 seconds — ABNORMAL HIGH (ref 24–37)

## 2011-12-10 NOTE — Progress Notes (Signed)
Called Dr. Allene Pyo office to give lab results of PT and PTT.-answering service responded, Dr Haroldine Laws on call.

## 2011-12-10 NOTE — Progress Notes (Signed)
Dr. Haroldine Laws called back and gave him PT and PTT results on pt., concerned with elevation and will call Dr. Narda Bonds and have him call us for results.

## 2011-12-10 NOTE — H&P (Signed)
PREOPERATIVE H&P  Chief Complaint: dysphagia  HPI: Stephanie Cortez is a 78 y.o. female who presents for evaluation of dysphagia. She underwent a Ba swallow which showed a prominent cricopharyngeus impression with difficulty passing a barium tab. She had attempted endoscopy with Dr. Jarold Motto but apparently had difficulty passing the scope past the UES. She's taken to the OR for botox injection and dilation of the UES.  Past Medical History  Diagnosis Date  . Bronchiectasis     >PFT 07/13/2008 in [place]  Fev 1.9L/76%, FVC 2.45L/74, Ratio 79, TLC 121%, DLCO 64%  AE BRonchiectasis - Dec 2010.New Rx:  outpatient - Feb 2011 - Rx outpatient  . Hypothyroidism   . Dyslipidemia   . Eczema   . Anemia     - Hgb 9.7gm% on 07/13/2008 in [place] -  Hgg 129gm% wiht normal irone levsl and ferritin 10/27/2008 in [place] Recurrent otitis/sinusitis  . Atrial fibrillation   . Glaucoma   . Back pain 2009  . Anxiety   . Diverticulosis   . COPD (chronic obstructive pulmonary disease)     never smoked  . Shortness of breath   . Hypertension   . GERD (gastroesophageal reflux disease)   . Arthritis    Past Surgical History  Procedure Date  . Cataract extraction     both  . Foot surgery 03/14/2011    gastroc slide-rt  . Total abdominal hysterectomy   . Colonoscopy   . Breast surgery     br bx   History   Social History  . Marital Status: Married    Spouse Name: N/A    Number of Children: 2  . Years of Education: N/A   Occupational History  . retired Runner, broadcasting/film/video    Social History Main Topics  . Smoking status: Never Smoker   . Smokeless tobacco: Never Used  . Alcohol Use: No  . Drug Use: No  . Sexually Active: Not on file   Other Topics Concern  . Not on file   Social History Narrative   2 children 4 grandchildrenRecently moved to [place] from [place] TeacherGrew up in [place]. Moved to [place]. Moved to [place] Feb 2010 due to duaghther living in [place]. Son lives in  [place], [place]. Husband works as Production designer, theatre/television/film in [place].   Family History  Problem Relation Age of Onset  . Hypertension Mother   . Atrial fibrillation      siblings  . Lung cancer Brother   . Emphysema Brother   . Other Father     miner's lung  . Colon polyps Sister   . Pancreatic cancer Sister   . Kidney disease Sister    Allergies  Allergen Reactions  . Biaxin (Clarithromycin) Nausea And Vomiting  . Flagyl (Metronidazole) Nausea And Vomiting  . Moxifloxacin     REACTION: weakness  . Pantoprazole Sodium     REACTION: rash  . Penicillins     REACTION: rash   Prior to Admission medications   Medication Sig Start Date End Date Taking? Authorizing Provider  Alum & Mag Hydroxide-Simeth (MAGIC MOUTHWASH) SOLN 5 cc (1 teaspoon) gargled well  and swallowed  3 times a day 11/16/11   [name], DO  BENICAR 40 MG tablet TAKE 1 TABLET BY MOUTH DAILY 11/29/11   [name], DO  budesonide-formoterol (SYMBICORT) 80-4.5 MCG/ACT inhaler Inhale 2 puffs into the lungs 2 (two) times daily.    Historical Provider, MD  diltiazem (TAZTIA XT) 300 MG 24 hr  capsule Take 300 mg by mouth daily.    Historical Provider, MD  levothyroxine (SYNTHROID, LEVOTHROID) 50 MCG tablet Take 50 mcg by mouth daily.    Historical Provider, MD  LORazepam (ATIVAN) 0.5 MG tablet TAKE 1 TABLET BY MOUTH EVERY 8 HOURS 11/28/11   [name], DO  MAGNESIUM PO Take by mouth. Takes 2 tbsp daily     Historical Provider, MD  meclizine (ANTIVERT) 25 MG tablet TAKE 1 TABLET BY MOUTH THREE TIMES DAILY AS NEEDED FOR VERTIGO 11/28/11   Grayling Congress Lowne, DO  MOVIPREP 100 G SOLR Take 1 kit (100 g total) by mouth once. 11/06/11   Mardella Layman, MD  olmesartan (BENICAR) 40 MG tablet Take 40 mg by mouth daily.    Historical Provider, MD  omeprazole (PRILOSEC) 20 MG capsule Take 20 mg by mouth daily.      Historical Provider, MD  Potassium (POTASSIMIN) 75 MG TABS Take 1 tablet by mouth daily. 1 tab po qd    Historical  Provider, MD  pravastatin (PRAVACHOL) 20 MG tablet Take 20 mg by mouth daily.    Historical Provider, MD  warfarin (COUMADIN) 2.5 MG tablet TAKE 1 TABLET BY MOUTH EVERY DAY 11/08/11   Duke Salvia, MD     Positive ROS: difficulty swallowing  All other systems have been reviewed and were otherwise negative with the exception of those mentioned in the HPI and as above.  Physical Exam: There were no vitals filed for this visit.  General: Alert, no acute distress Oral: Normal oral mucosa and tonsils Nasal: Clear nasal passages Neck: No palpable adenopathy or thyroid nodules Ear: Ear canal is clear with normal appearing TMs Cardiovascular: Regular rate and rhythm, no murmur.  Respiratory: Clear to auscultation Neurologic: Alert and oriented x 3   Assessment/Plan: dysphagia Plan for Procedure(s): ESOPHAGOSCOPY WITH BOTOX INJECTION   Dillard Cannon, MD 12/10/2011 5:04 PM

## 2011-12-10 NOTE — Progress Notes (Signed)
Dr. Narda Bonds called back and gave him results of PT and PTT --- OK for surgery.

## 2011-12-11 ENCOUNTER — Encounter (HOSPITAL_BASED_OUTPATIENT_CLINIC_OR_DEPARTMENT_OTHER): Payer: Self-pay | Admitting: *Deleted

## 2011-12-11 ENCOUNTER — Encounter (HOSPITAL_BASED_OUTPATIENT_CLINIC_OR_DEPARTMENT_OTHER)
Admission: RE | Disposition: A | Discharge status: Home / Self Care | Payer: Self-pay | Source: Ambulatory Visit | Attending: Otolaryngology

## 2011-12-11 ENCOUNTER — Encounter (HOSPITAL_BASED_OUTPATIENT_CLINIC_OR_DEPARTMENT_OTHER): Payer: Self-pay | Admitting: Anesthesiology

## 2011-12-11 ENCOUNTER — Ambulatory Visit (HOSPITAL_BASED_OUTPATIENT_CLINIC_OR_DEPARTMENT_OTHER)
Admission: RE | Admit: 2011-12-11 | Discharge: 2011-12-11 | Disposition: A | Discharge status: Home / Self Care | Payer: BC Managed Care – PPO | Source: Ambulatory Visit | Attending: Otolaryngology | Admitting: Otolaryngology

## 2011-12-11 ENCOUNTER — Ambulatory Visit (HOSPITAL_BASED_OUTPATIENT_CLINIC_OR_DEPARTMENT_OTHER): Payer: BC Managed Care – PPO | Admitting: Anesthesiology

## 2011-12-11 DIAGNOSIS — K220 Achalasia of cardia: Secondary | ICD-10-CM | POA: Diagnosis not present

## 2011-12-11 DIAGNOSIS — J449 Chronic obstructive pulmonary disease, unspecified: Secondary | ICD-10-CM | POA: Insufficient documentation

## 2011-12-11 DIAGNOSIS — K219 Gastro-esophageal reflux disease without esophagitis: Secondary | ICD-10-CM | POA: Insufficient documentation

## 2011-12-11 DIAGNOSIS — E039 Hypothyroidism, unspecified: Secondary | ICD-10-CM | POA: Insufficient documentation

## 2011-12-11 DIAGNOSIS — J4489 Other specified chronic obstructive pulmonary disease: Secondary | ICD-10-CM | POA: Insufficient documentation

## 2011-12-11 DIAGNOSIS — K222 Esophageal obstruction: Secondary | ICD-10-CM | POA: Diagnosis not present

## 2011-12-11 DIAGNOSIS — I10 Essential (primary) hypertension: Secondary | ICD-10-CM | POA: Insufficient documentation

## 2011-12-11 DIAGNOSIS — R1310 Dysphagia, unspecified: Secondary | ICD-10-CM | POA: Diagnosis not present

## 2011-12-11 DIAGNOSIS — E785 Hyperlipidemia, unspecified: Secondary | ICD-10-CM | POA: Insufficient documentation

## 2011-12-11 HISTORY — PX: ESOPHAGOSCOPY W/ BOTOX INJECTION: SHX1533

## 2011-12-11 HISTORY — DX: Chronic obstructive pulmonary disease, unspecified: J44.9

## 2011-12-11 HISTORY — DX: Essential (primary) hypertension: I10

## 2011-12-11 LAB — POCT I-STAT, CHEM 8
BUN: 8 mg/dL (ref 6–23)
Calcium, Ion: 1.29 mmol/L (ref 1.12–1.32)
Chloride: 103 mEq/L (ref 96–112)
Creatinine, Ser: 0.8 mg/dL (ref 0.50–1.10)
Glucose, Bld: 91 mg/dL (ref 70–99)
HCT: 39 % (ref 36.0–46.0)
Hemoglobin: 13.3 g/dL (ref 12.0–15.0)
Potassium: 4.1 mEq/L (ref 3.5–5.1)
Sodium: 134 mEq/L — ABNORMAL LOW (ref 135–145)
TCO2: 23 mmol/L (ref 0–100)

## 2011-12-11 LAB — POCT HEMOGLOBIN-HEMACUE: Hemoglobin: 10.4 g/dL — ABNORMAL LOW (ref 12.0–15.0)

## 2011-12-11 SURGERY — EGD, WITH BOTULINUM TOXIN INJECTION
Anesthesia: General | Site: Mouth | Wound class: Clean Contaminated

## 2011-12-11 MED ORDER — PROPOFOL 10 MG/ML IV EMUL
INTRAVENOUS | Status: DC | PRN
  Administered 2011-12-11: 180 mg via INTRAVENOUS

## 2011-12-11 MED ORDER — DEXAMETHASONE SODIUM PHOSPHATE 4 MG/ML IJ SOLN
INTRAMUSCULAR | Status: DC | PRN
  Administered 2011-12-11: 10 mg via INTRAVENOUS

## 2011-12-11 MED ORDER — SUCCINYLCHOLINE CHLORIDE 20 MG/ML IJ SOLN
INTRAMUSCULAR | Status: DC | PRN
  Administered 2011-12-11: 100 mg via INTRAVENOUS

## 2011-12-11 MED ORDER — FENTANYL CITRATE 0.05 MG/ML IJ SOLN: 25.0000 ug | INTRAMUSCULAR | Status: DC | PRN

## 2011-12-11 MED ORDER — METOCLOPRAMIDE HCL 5 MG/ML IJ SOLN
INTRAMUSCULAR | Status: DC | PRN
  Administered 2011-12-11: 10 mg via INTRAVENOUS

## 2011-12-11 MED ORDER — METOCLOPRAMIDE HCL 5 MG/ML IJ SOLN: 10.0000 mg | Freq: Once | INTRAMUSCULAR | Status: DC | PRN

## 2011-12-11 MED ORDER — LACTATED RINGERS IV SOLN
INTRAVENOUS | Status: DC
  Administered 2011-12-11 (×2): via INTRAVENOUS

## 2011-12-11 MED ORDER — LIDOCAINE HCL (CARDIAC) 20 MG/ML IV SOLN
INTRAVENOUS | Status: DC | PRN
  Administered 2011-12-11: 100 mg via INTRAVENOUS

## 2011-12-11 MED ORDER — ONABOTULINUMTOXINA 100 UNITS IJ SOLR
INTRAMUSCULAR | Status: DC | PRN
  Administered 2011-12-11: 65 [IU] via INTRAMUSCULAR

## 2011-12-11 MED ORDER — SODIUM CHLORIDE 0.9 % IJ SOLN
INTRAMUSCULAR | Status: DC | PRN
  Administered 2011-12-11: 3 mL via INTRAVENOUS

## 2011-12-11 MED ORDER — FENTANYL CITRATE 0.05 MG/ML IJ SOLN
INTRAMUSCULAR | Status: DC | PRN
  Administered 2011-12-11: 50 ug via INTRAVENOUS

## 2011-12-11 MED ORDER — ONDANSETRON HCL 4 MG/2ML IJ SOLN
INTRAMUSCULAR | Status: DC | PRN
  Administered 2011-12-11: 4 mg via INTRAVENOUS

## 2011-12-11 MED ORDER — ACETAMINOPHEN 10 MG/ML IV SOLN
1000.0000 mg | Freq: Once | INTRAVENOUS | Status: AC
  Administered 2011-12-11: 1000 mg via INTRAVENOUS

## 2011-12-11 MED ORDER — OXYCODONE HCL 5 MG PO TABS: 5.0000 mg | ORAL_TABLET | Freq: Once | ORAL | Status: DC | PRN

## 2011-12-11 SURGICAL SUPPLY — 34 items
CANISTER SUCTION 1200CC (MISCELLANEOUS) ×2 IMPLANT
CATH ROBINSON RED A/P 12FR (CATHETERS) IMPLANT
CATH ROBINSON RED A/P 8FR (CATHETERS) IMPLANT
CLOTH BEACON ORANGE TIMEOUT ST (SAFETY) ×2 IMPLANT
CONT SPEC 4OZ CLIKSEAL STRL BL (MISCELLANEOUS) IMPLANT
GAUZE SPONGE 4X4 12PLY STRL LF (GAUZE/BANDAGES/DRESSINGS) ×3 IMPLANT
GLOVE BIOGEL PI IND STRL 7.5 (GLOVE) IMPLANT
GLOVE BIOGEL PI INDICATOR 7.5 (GLOVE) ×1
GLOVE SKINSENSE NS SZ7.0 (GLOVE) ×1
GLOVE SKINSENSE STRL SZ7.0 (GLOVE) IMPLANT
GLOVE SS BIOGEL STRL SZ 7.5 (GLOVE) ×1 IMPLANT
GLOVE SUPERSENSE BIOGEL SZ 7.5 (GLOVE) ×1
GOWN PREVENTION PLUS XLARGE (GOWN DISPOSABLE) IMPLANT
GOWN PREVENTION PLUS XXLARGE (GOWN DISPOSABLE) IMPLANT
GUARD TEETH (MISCELLANEOUS) ×1 IMPLANT
NDL SAFETY ECLIPSE 18X1.5 (NEEDLE) ×1 IMPLANT
NDL SPNL 22GX7 QUINCKE BK (NEEDLE) ×1 IMPLANT
NEEDLE HYPO 18GX1.5 SHARP (NEEDLE) ×2
NEEDLE SPNL 22GX7 QUINCKE BK (NEEDLE) ×2 IMPLANT
NS IRRIG 1000ML POUR BTL (IV SOLUTION) ×2 IMPLANT
PAD ALCOHOL SWAB (MISCELLANEOUS) IMPLANT
PATTIES SURGICAL .5 X3 (DISPOSABLE) IMPLANT
SHEET MEDIUM DRAPE 40X70 STRL (DRAPES) ×2 IMPLANT
SLEEVE SCD COMPRESS KNEE MED (MISCELLANEOUS) ×2 IMPLANT
SOLUTION BUTLER CLEAR DIP (MISCELLANEOUS) ×1 IMPLANT
SURGILUBE 2OZ TUBE FLIPTOP (MISCELLANEOUS) ×2 IMPLANT
SUT SILK 3 0 TIES 17X18 (SUTURE)
SUT SILK 3-0 18XBRD TIE BLK (SUTURE) IMPLANT
SYR 5ML LL (SYRINGE) IMPLANT
SYR CONTROL 10ML LL (SYRINGE) ×2 IMPLANT
SYR TB 1ML LL NO SAFETY (SYRINGE) ×2 IMPLANT
TOWEL OR 17X24 6PK STRL BLUE (TOWEL DISPOSABLE) ×2 IMPLANT
TUBE CONNECTING 20X1/4 (TUBING) ×2 IMPLANT
WATER STERILE IRR 1000ML POUR (IV SOLUTION) ×1 IMPLANT

## 2011-12-11 NOTE — Discharge Instructions (Signed)
Start with soft foods and advance diet as tolerated Use throat lozenges for sore throat and tylenol for pain. Restart your coumadin tomorrow. Call office for follow up appt in 1 week     [phone]    Post Anesthesia Home Care Instructions  Activity: Get plenty of rest for the remainder of the day. A responsible adult should stay with you for 24 hours following the procedure.  For the next 24 hours, DO NOT: -Drive a car -Advertising copywriter -Drink alcoholic beverages -Take any medication unless instructed by your physician -Make any legal decisions or sign important papers.  Meals: Start with liquid foods such as gelatin or soup. Progress to regular foods as tolerated. Avoid greasy, spicy, heavy foods. If nausea and/or vomiting occur, drink only clear liquids until the nausea and/or vomiting subsides. Call your physician if vomiting continues.  Special Instructions/Symptoms: Your throat may feel dry or sore from the anesthesia or the breathing tube placed in your throat during surgery. If this causes discomfort, gargle with warm salt water. The discomfort should disappear within 24 hours.    Esophageal Dilatation The esophagus is the long, narrow tube which carries food and liquid from the mouth to the stomach. Esophageal dilatation is the technique used to stretch a blocked or narrowed portion of the esophagus. This procedure is used when a part of the esophagus has become so narrow that it becomes difficult, painful or even impossible to swallow. This is generally an uncomplicated form of treatment. When this is not successful, chest surgery may be required. This is a much more extensive form of treatment with a longer recovery time. CAUSES  Some of the more common causes of blockage or strictures of the esophagus are:  Narrowing from longstanding inflammation (soreness and redness) of the lower esophagus. This comes from the constant exposure of the lower esophagus to the acid which  bubbles up from the stomach. Over time this causes scarring and narrowing of the lower esophagus.   Hiatal hernia in which a small part of the stomach bulges (herniates) up through the diaphragm. This can cause a gradual narrowing of the end of the esophagus.   Schatzki's Ring is a narrow ring of benign (non-cancerous) fibrous tissue which constricts the lower esophagus. The reason for this is not known.   Scleroderma is a connective tissue disorder that affects the esophagus and makes swallowing difficult.   Achalasia is an absence of nerves to the lower esophagus and to the esophageal sphincter. This is the circular muscle between the stomach and esophagus that relaxes to allow food into the stomach. After swallowing, it contracts to keep food in the stomach. This absence of nerves may be congenital (present since birth). This can cause irregular spasms of the lower esophageal muscle. This spasm does not open up to allow food and fluid through. The result is a persistent blockage with subsequent slow trickling of the esophageal contents into the stomach.   Strictures may develop from swallowing materials which damage the esophagus. Some examples are strong acids or alkalis such as lye.   Growths such as benign (non-cancerous) and malignant (cancerous) tumors can block the esophagus.   Heredity (present since birth) causes.  DIAGNOSIS  Your caregiver often suspects this problem by taking a medical history. They will also do a physical exam. They can then prove their suspicions using X-rays and endoscopy. Endoscopy is an exam in which a tube like a small flexible telescope is used to look at your esophagus.  TREATMENT  There are different stretching (dilating) techniques which can be used. Simple bougie dilatation may be done in the office. This usually takes only a couple minutes. A numbing (anesthetic) spray of the throat is used. Endoscopy, when done, is done in an endoscopy suite, under mild  sedation. When fluoroscopy is used, the procedure is performed in X-ray. Other techniques require a little longer time. Recovery is usually quick. There is no waiting time to begin eating and drinking to test success of the treatment. Following are some of the methods used. Narrowing of the esophagus is treated by making it bigger. Commonly this is a mechanical problem which can be treated with stretching. This can be done in different ways. Your caregiver will discuss these with you. Some of the means used are:  A series of graduated (increasing thickness) flexible dilators can be used. These are weighted tubes passed through the esophagus into the stomach. The tubes used become progressively larger until the desired stretched size is reached. Graduated dilators are a simple and quick way of opening the esophagus. No visualization is required.   Another method is the use of endoscopy to place a flexible wire across the stricture. The endoscope is removed and the wire left in place. A dilator with a hole through it from end to end is guided down the esophagus and across the stricture. One or more of these dilators are passed over the wire. At the end of the exam, the wire is removed. This type of treatment may be performed in the X-ray department under fluoroscopy. An advantage of this procedure is the examiner is visualizing the end opening in the esophagus.   Stretching of the esophagus may be done using balloons. Deflated balloons are placed through the endoscope and across the stricture. This type of balloon dilatation is often done at the time of endoscopy or fluoroscopy. Flexible endoscopy allows the examiner to directly view the stricture. A balloon is inserted in the deflated form into the area of narrowing. It is then inflated with air to a certain pressure that is pre-set for a given circumference. When inflated, it becomes sausage shaped, stretched, and makes the stricture larger.   Achalasia  requires a longer larger balloon-type dilator. This is frequently done under X-ray control. In this situation, the spastic muscle fibers in the lower esophagus are stretched.  All of the above procedures make the passage of food and water into the stomach easier. They also make it easier for stomach contents to reflux back into the esophagus. Special medications may be used following the procedure to help prevent further stricturing. Proton-pump inhibitor medications are good at decreasing the amount of acid in the stomach juice. When stomach juice refluxes into the esophagus, the juice is no longer as acidic and is less likely to burn or scar the esophagus. RISKS AND COMPLICATIONS Esophageal dilatation is usually performed effectively and without problems. Some complications that can occur are:  A small amount of bleeding almost always happens where the stretching takes place. If this is too excessive it may require more aggressive treatment.   An uncommon complication is perforation (making a hole) of the esophagus. The esophagus is thin. It is easy to make a hole in it. If this happens, an operation may be necessary to repair this.   A small, undetected perforation could lead to an infection in the chest. This can be very serious.  HOME CARE INSTRUCTIONS   If you received sedation for your procedure, do not drive,  make important decisions, or perform any activities requiring your full coordination. Do not drink alcohol, take sedatives, or use any mind altering chemicals unless instructed by your caregiver.   You may use throat lozenges or warm salt water gargles if you have throat discomfort   You can begin eating and drinking normally on return home unless instructed otherwise. Do not purposely try to force large chunks of food down to test the benefits of your procedure.   Mild discomfort can be eased with sips of ice water.   Medications for discomfort may or may not be needed.  SEEK  IMMEDIATE MEDICAL CARE IF:   You begin vomiting up blood.   You develop black tarry stools   You develop chills or an unexplained temperature of over 101 F (38.3 C)   You develop chest or abdominal pain.   You develop shortness of breath or feel lightheaded or faint.   Your swallowing is becoming more painful, difficult, or you are unable to swallow.  MAKE SURE YOU:   Understand these instructions.   Will watch your condition.   Will get help right away if you are not doing well or get worse.  Document Released: 08/09/2005 Document Revised: 06/07/2011 Document Reviewed: 09/26/2005 [facility] Patient Information 2012 [place], [place].

## 2011-12-11 NOTE — Anesthesia Procedure Notes (Signed)
Procedure Name: Intubation Date/Time: 12/11/2011 7:55 AM Performed by: Zenia Resides D Pre-anesthesia Checklist: Patient identified, Emergency Drugs available, Suction available, Patient being monitored and Timeout performed Patient Re-evaluated:Patient Re-evaluated prior to inductionOxygen Delivery Method: Circle System Utilized Preoxygenation: Pre-oxygenation with 100% oxygen Intubation Type: IV induction Ventilation: Mask ventilation without difficulty Laryngoscope Size: Mac and 3 Grade View: Grade I Tube type: Oral Tube size: 6.0 mm Number of attempts: 1 Airway Equipment and Method: stylet and oral airway Placement Confirmation: ETT inserted through vocal cords under direct vision,  positive ETCO2 and breath sounds checked- equal and bilateral Secured at: 21 cm Tube secured with: Tape Dental Injury: Teeth and Oropharynx as per pre-operative assessment

## 2011-12-11 NOTE — Op Note (Signed)
NAME:  ADREENA, WILLITS             ACCOUNT NO.:  [id number]  MEDICAL RECORD NO.:  [id number]  LOCATION:  XRAY                         FACILITY:  [facility]  PHYSICIAN:  Kristine Garbe. Ezzard Standing, M.D.DATE OF BIRTH:  1934-01-11  DATE OF PROCEDURE:  12/11/2011 DATE OF DISCHARGE:  12/11/2011                              OPERATIVE REPORT   PREOPERATIVE DIAGNOSIS:  Dysphagia with prominent cricopharyngeus.  POSTOPERATIVE DIAGNOSIS:  Dysphagia with prominent cricopharyngeus  OPERATION PERFORMED:  Esophagoscopy with dilation using Savary dilators to #18 mm dilation.  Botox injection into the cricopharyngeus muscle, 60 units.  SURGEON:  Kristine Garbe. Ezzard Standing, MD  ANESTHESIA:  General endotracheal.  COMPLICATIONS:  None.  BRIEF CLINICAL NOTE:  Mercer Stallworth is a 78 year old female who has had chronic problems with dysphagia.  She gets food as well as tablets caught in the upper esophagus at the level of the upper esophageal sphincter.  She underwent a barium swallow, which showed very prominent cricopharyngeus muscle and it showed a barium tablet getting stuck at the upper esophageal sphincter at level of the cricopharyngeus which may __________ to her symptoms.  Dr. Jarold Motto had difficulty passing the flexible GI endoscope and referred her to me for subsequent treatment. She is taken to the operating room at this time for Botox injection and dilation of the upper esophageal sphincter.  DESCRIPTION OF PROCEDURE:  After adequate endotracheal anesthesia, first direct laryngoscopy was performed.  The base of tongue, vallecula, epiglottis were all normal.  Piriform sinuses were clear as was the mucosa.  At the level of the cricopharyngeus which was very prominent, the opening of the upper esophageal sphincter was very anterior. Initially before dilation, the right posterior and left quadrants of the cricopharyngeus were injected with approximately 15 units of Botox each. The guidewire of the  Savary dilators were then passed through the upper esophageal sphincter very anteriorly and dilation of the upper esophageal sphincter was performed starting with the 10 mm  Savary dilator and was sequentially advanced to the 18 mm Savary dilator. Following dilation, the rigid cervical esophagoscope was easily passed and the membranes all appeared clear.  At the very completion of the procedure, additional 15 units of Botox was injected into the more prominent posterior aspect of the cricopharyngeus muscle.  This completed the procedure.  Keryn was awoke from anesthesia and transferred to recovery room, postop doing well.  DISPOSITION:  She is discharged home later this morning.  We will have her follow up in my office in 1 week for recheck.  She is instructed to take Tylenol p.r.n. pain and throat lozenges for sore throat and will notify us if she has any problems and otherwise follow up in 1 week for recheck in the office.          ______________________________ Kristine Garbe. Ezzard Standing, M.D.     CEN/MEDQ  D:  12/11/2011  T:  12/11/2011  Job:  [id number]  cc:   Vania Rea. Jarold Motto, MD, Caleen Essex, FAGA

## 2011-12-11 NOTE — Anesthesia Postprocedure Evaluation (Signed)
Anesthesia Post Note  Patient: Stephanie Cortez  Procedure(s) Performed: Procedure(s) (LRB): ESOPHAGOSCOPY WITH BOTOX INJECTION (N/A)  Anesthesia type: General  Patient location: PACU  Post pain: Pain level controlled  Post assessment: Patient's Cardiovascular Status Stable  Last Vitals:  Filed Vitals:   12/11/11 1000  BP: 125/58  Pulse:   Temp: 36.2 C  Resp: 20    Post vital signs: Reviewed and stable  Level of consciousness: alert  Complications: No apparent anesthesia complications

## 2011-12-11 NOTE — Transfer of Care (Signed)
Immediate Anesthesia Transfer of Care Note  Patient: Stephanie Cortez  Procedure(s) Performed: Procedure(s) (LRB): ESOPHAGOSCOPY WITH BOTOX INJECTION (N/A)  Patient Location: PACU  Anesthesia Type: General  Level of Consciousness: awake, alert  and oriented  Airway & Oxygen Therapy: Patient Spontanous Breathing and Patient connected to face mask oxygen  Post-op Assessment: Report given to PACU RN and Post -op Vital signs reviewed and stable  Post vital signs: Reviewed and stable  Complications: No apparent anesthesia complications

## 2011-12-11 NOTE — Brief Op Note (Signed)
12/11/2011  8:37 AM  PATIENT:  Stephanie Cortez  78 y.o. female  PRE-OPERATIVE DIAGNOSIS:  dysphagia  POST-OPERATIVE DIAGNOSIS:  dysphagia  PROCEDURE:  Procedure(s) (LRB): ESOPHAGOSCOPY  With DLATION WITH BOTOX INJECTION (N/A)  SURGEON:  Surgeon(s) and Role:    * Drema Halon, MD - Primary  PHYSICIAN ASSISTANT:   ASSISTANTS: none   ANESTHESIA:   general  EBL:  Total I/O In: 1000 [I.V.:1000] Out: -   BLOOD ADMINISTERED:none  DRAINS: none   LOCAL MEDICATIONS USED:  NONE  SPECIMEN:  No Specimen  DISPOSITION OF SPECIMEN:  N/A  COUNTS:  YES  TOURNIQUET:  * No tourniquets in log *  DICTATION: .Other Dictation: Dictation Number [id number]  PLAN OF CARE: Discharge to home after PACU  PATIENT DISPOSITION:  PACU - hemodynamically stable.   Delay start of Pharmacological VTE agent (>24hrs) due to surgical blood loss or risk of bleeding: not applicable

## 2011-12-11 NOTE — Interval H&P Note (Signed)
History and Physical Interval Note:  12/11/2011 7:40 AM  Stephanie Cortez  has presented today for surgery, with the diagnosis of dysphagia  The various methods of treatment have been discussed with the patient and family. After consideration of risks, benefits and other options for treatment, the patient has consented to  Procedure(s) (LRB): ESOPHAGOSCOPY WITH BOTOX INJECTION (N/A) as a surgical intervention .  The patients' history has been reviewed, patient examined, no change in status, stable for surgery.  I have reviewed the patients' chart and labs.  Questions were answered to the patient's satisfaction.     Jonanthan Bolender

## 2011-12-11 NOTE — Anesthesia Preprocedure Evaluation (Signed)
Anesthesia Evaluation  Patient identified by MRN, date of birth, ID band Patient awake    Reviewed: Allergy & Precautions, H&P , NPO status , Patient's Chart, lab work & pertinent test results, reviewed documented beta blocker date and time   Airway Mallampati: II TM Distance: >3 FB Neck ROM: full    Dental   Pulmonary shortness of breath and with exertion, COPD         Cardiovascular hypertension, On Medications + dysrhythmias Atrial Fibrillation     Neuro/Psych negative neurological ROS  negative psych ROS   GI/Hepatic Neg liver ROS, GERD-  Medicated and Controlled,  Endo/Other  negative endocrine ROS  Renal/GU negative Renal ROS  negative genitourinary   Musculoskeletal   Abdominal   Peds  Hematology negative hematology ROS (+)   Anesthesia Other Findings See surgeon's H&P   Reproductive/Obstetrics negative OB ROS                           Anesthesia Physical Anesthesia Plan  ASA: III  Anesthesia Plan: General   Post-op Pain Management:    Induction: Intravenous  Airway Management Planned: Oral ETT  Additional Equipment:   Intra-op Plan:   Post-operative Plan: Extubation in OR  Informed Consent: I have reviewed the patients History and Physical, chart, labs and discussed the procedure including the risks, benefits and alternatives for the proposed anesthesia with the patient or authorized representative who has indicated his/her understanding and acceptance.   Dental Advisory Given  Plan Discussed with: CRNA and Surgeon  Anesthesia Plan Comments:         Anesthesia Quick Evaluation

## 2011-12-12 ENCOUNTER — Encounter (HOSPITAL_BASED_OUTPATIENT_CLINIC_OR_DEPARTMENT_OTHER): Payer: Self-pay | Admitting: Otolaryngology

## 2011-12-21 ENCOUNTER — Ambulatory Visit (INDEPENDENT_AMBULATORY_CARE_PROVIDER_SITE_OTHER): Payer: BC Managed Care – PPO | Admitting: *Deleted

## 2011-12-21 DIAGNOSIS — I4891 Unspecified atrial fibrillation: Secondary | ICD-10-CM

## 2011-12-21 LAB — POCT INR: INR: 2.3

## 2011-12-26 ENCOUNTER — Other Ambulatory Visit: Payer: Self-pay | Admitting: Family Medicine

## 2012-01-01 ENCOUNTER — Encounter: Payer: Self-pay | Admitting: Family Medicine

## 2012-01-01 ENCOUNTER — Ambulatory Visit (INDEPENDENT_AMBULATORY_CARE_PROVIDER_SITE_OTHER): Payer: BC Managed Care – PPO | Admitting: Family Medicine

## 2012-01-01 VITALS — BP 126/60 | HR 84 | Temp 98.1°F | Wt 189.0 lb

## 2012-01-01 DIAGNOSIS — B370 Candidal stomatitis: Secondary | ICD-10-CM | POA: Diagnosis not present

## 2012-01-01 DIAGNOSIS — K219 Gastro-esophageal reflux disease without esophagitis: Secondary | ICD-10-CM

## 2012-01-01 DIAGNOSIS — I10 Essential (primary) hypertension: Secondary | ICD-10-CM | POA: Diagnosis not present

## 2012-01-01 DIAGNOSIS — K5792 Diverticulitis of intestine, part unspecified, without perforation or abscess without bleeding: Secondary | ICD-10-CM | POA: Insufficient documentation

## 2012-01-01 DIAGNOSIS — E785 Hyperlipidemia, unspecified: Secondary | ICD-10-CM

## 2012-01-01 DIAGNOSIS — E039 Hypothyroidism, unspecified: Secondary | ICD-10-CM | POA: Diagnosis not present

## 2012-01-01 DIAGNOSIS — R300 Dysuria: Secondary | ICD-10-CM

## 2012-01-01 DIAGNOSIS — R42 Dizziness and giddiness: Secondary | ICD-10-CM | POA: Diagnosis not present

## 2012-01-01 HISTORY — DX: Diverticulitis of intestine, part unspecified, without perforation or abscess without bleeding: K57.92

## 2012-01-01 LAB — POCT URINALYSIS DIPSTICK
Bilirubin, UA: NEGATIVE
Glucose, UA: NEGATIVE
Ketones, UA: NEGATIVE
Leukocytes, UA: NEGATIVE
Nitrite, UA: NEGATIVE
Protein, UA: NEGATIVE
Spec Grav, UA: <=1.005
Urobilinogen, UA: 0.2
pH, UA: 6.5

## 2012-01-01 MED ORDER — NYSTATIN 100000 UNIT/ML MT SUSP: 500000.0000 [IU] | Freq: Four times a day (QID) | OROMUCOSAL | Status: AC

## 2012-01-01 MED ORDER — LEVOTHYROXINE SODIUM 50 MCG PO TABS: 50.0000 ug | ORAL_TABLET | Freq: Every day | ORAL | Status: DC

## 2012-01-01 MED ORDER — MECLIZINE HCL 25 MG PO TABS: 25.0000 mg | ORAL_TABLET | Freq: Three times a day (TID) | ORAL | Status: DC | PRN

## 2012-01-01 NOTE — Assessment & Plan Note (Signed)
Stable Cont meds 

## 2012-01-01 NOTE — Progress Notes (Signed)
  Subjective:    Patient ID: Stephanie Cortez, female    DOB: 1934/06/05, 78 y.o.   MRN: [id number]  HPI Pt here for f/u but c/o dizziness and hx of inner ear problems. She saw an ENT in [place] and sees Dr Ezzard Standing her.  GErd better after stretching esophagus but lightheadedness is still there.  She had tubes in L ear several times.  She will make a f/u for this problem.   Pt also still c/o white coated tongue.  She was given dukes magic mouthwash x2  with no relief of symptoms.     Pt was in ER with Norovirus in April and had h pylori at same time.  She was unable to tolerate abx.  Everything was stopped.  Pt finally feeling better.  Pt now c/o dysuria--she was told in ER she had a uti but never took keflex.    Review of Systems as above   Objective:   Physical Exam  Constitutional: She is oriented to person, place, and time. She appears well-developed and well-nourished.  HENT:  Right Ear: External ear normal.  Left Ear: External ear normal.  Nose: Nose normal.  Mouth/Throat: Oropharynx is clear and moist.  Eyes: Pupils are equal, round, and reactive to light.  Neck: Normal range of motion. Neck supple.  Cardiovascular: Normal rate and regular rhythm.   Pulmonary/Chest: Effort normal and breath sounds normal.  Musculoskeletal: She exhibits no edema and no tenderness.  Lymphadenopathy:    She has no cervical adenopathy.  Neurological: She is alert and oriented to person, place, and time.  Psychiatric: She has a normal mood and affect. Her behavior is normal. Judgment and thought content normal.          Assessment & Plan:

## 2012-01-01 NOTE — Patient Instructions (Addendum)
Vertigo Vertigo means you feel like you or your surroundings are moving when they are not. Vertigo can be dangerous if it occurs when you are at work, driving, or performing difficult activities.  CAUSES  Vertigo occurs when there is a conflict of signals sent to your brain from the visual and sensory systems in your body. There are many different causes of vertigo, including:  Infections, especially in the inner ear.   A bad reaction to a drug or misuse of alcohol and medicines.   Withdrawal from drugs or alcohol.   Rapidly changing positions, such as lying down or rolling over in bed.   A migraine headache.   Decreased blood flow to the brain.   Increased pressure in the brain from a head injury, infection, tumor, or bleeding.  SYMPTOMS  You may feel as though the world is spinning around or you are falling to the ground. Because your balance is upset, vertigo can cause nausea and vomiting. You may have involuntary eye movements (nystagmus). DIAGNOSIS  Vertigo is usually diagnosed by physical exam. If the cause of your vertigo is unknown, your caregiver may perform imaging tests, such as an MRI scan (magnetic resonance imaging). TREATMENT  Most cases of vertigo resolve on their own, without treatment. Depending on the cause, your caregiver may prescribe certain medicines. If your vertigo is related to body position issues, your caregiver may recommend movements or procedures to correct the problem. In rare cases, if your vertigo is caused by certain inner ear problems, you may need surgery. HOME CARE INSTRUCTIONS   Follow your caregiver's instructions.   Avoid driving.   Avoid operating heavy machinery.   Avoid performing any tasks that would be dangerous to you or others during a vertigo episode.   Tell your caregiver if you notice that certain medicines seem to be causing your vertigo. Some of the medicines used to treat vertigo episodes can actually make them worse in some  people.  SEEK IMMEDIATE MEDICAL CARE IF:   Your medicines do not relieve your vertigo or are making it worse.   You develop problems with talking, walking, weakness, or using your arms, hands, or legs.   You develop severe headaches.   Your nausea or vomiting continues or gets worse.   You develop visual changes.   A family member notices behavioral changes.   Your condition gets worse.  MAKE SURE YOU:  Understand these instructions.   Will watch your condition.   Will get help right away if you are not doing well or get worse.  Document Released: 03/28/2005 Document Revised: 06/07/2011 Document Reviewed: 01/04/2011 ExitCare Patient Information 2012 [employer]. 

## 2012-01-01 NOTE — Assessment & Plan Note (Signed)
Culture sent Call or rto prn

## 2012-01-01 NOTE — Assessment & Plan Note (Signed)
Lab Results  Component Value Date   TSH 2.84 11/08/2011   con't meds

## 2012-01-03 LAB — URINE CULTURE: Colony Count: 8000

## 2012-01-07 ENCOUNTER — Telehealth: Payer: Self-pay | Admitting: *Deleted

## 2012-01-07 NOTE — Telephone Encounter (Signed)
Request update on U/A [that showed blood]-Cx results/SLS

## 2012-01-07 NOTE — Telephone Encounter (Signed)
Tried calling patient. No ans--- Cp[y of labs mailed    KP

## 2012-01-07 NOTE — Telephone Encounter (Signed)
Notes Recorded by [name], DO on 01/05/2012 at 12:43 PM Neg culture

## 2012-01-08 NOTE — Telephone Encounter (Signed)
Patient aware and she voiced understanding.     KP 

## 2012-01-14 ENCOUNTER — Other Ambulatory Visit: Payer: Self-pay | Admitting: Family Medicine

## 2012-01-14 NOTE — Telephone Encounter (Signed)
Last seen 01/01/12 and filled 11/28/11 # 90. Please advise    KP

## 2012-01-16 ENCOUNTER — Ambulatory Visit (INDEPENDENT_AMBULATORY_CARE_PROVIDER_SITE_OTHER): Payer: BC Managed Care – PPO | Admitting: Pharmacist

## 2012-01-16 DIAGNOSIS — I4891 Unspecified atrial fibrillation: Secondary | ICD-10-CM | POA: Diagnosis not present

## 2012-01-16 LAB — POCT INR: INR: 2.4

## 2012-01-29 ENCOUNTER — Encounter: Payer: Self-pay | Admitting: Family Medicine

## 2012-01-29 ENCOUNTER — Ambulatory Visit (INDEPENDENT_AMBULATORY_CARE_PROVIDER_SITE_OTHER): Payer: BC Managed Care – PPO | Admitting: Family Medicine

## 2012-01-29 VITALS — BP 120/60 | HR 89 | Temp 98.1°F | Wt 187.2 lb

## 2012-01-29 DIAGNOSIS — T63301A Toxic effect of unspecified spider venom, accidental (unintentional), initial encounter: Secondary | ICD-10-CM

## 2012-01-29 DIAGNOSIS — T6391XA Toxic effect of contact with unspecified venomous animal, accidental (unintentional), initial encounter: Secondary | ICD-10-CM | POA: Diagnosis not present

## 2012-01-29 DIAGNOSIS — T63391A Toxic effect of venom of other spider, accidental (unintentional), initial encounter: Secondary | ICD-10-CM | POA: Diagnosis not present

## 2012-01-29 MED ORDER — PREDNISONE 10 MG PO TABS: ORAL_TABLET | ORAL | Status: DC

## 2012-01-29 MED ORDER — METHYLPREDNISOLONE ACETATE 80 MG/ML IJ SUSP
80.0000 mg | Freq: Once | INTRAMUSCULAR | Status: AC
  Administered 2012-01-29: 80 mg via INTRAMUSCULAR

## 2012-01-29 NOTE — Patient Instructions (Signed)
Insect Sting Allergy  An insect sting can cause pain, redness, and itching at the sting site. Symptoms of an allergic reaction are usually contained in the area of the sting site (localized). An allergic reaction usually occurs within minutes of an insect sting. Redness and swelling of the sting site may last as long as 1 week.  SYMPTOMS    A local reaction at the sting site can cause:   Pain.   Redness.   Itching.   Swelling.   A systemic reaction can cause a reaction anywhere on your body. For example, you may develop the following:   Hives.   Generalized swelling.   Body aches.   Itching.   Dizziness.   Nausea or vomiting.   A more serious (anaphylactic) reaction can involve:   Difficulty breathing or wheezing.   Tongue or throat swelling.   Fainting.  HOME CARE INSTRUCTIONS    If you are stung, look to see if the stinger is still in the skin. This can appear as a small, black dot at the sting site. The stinger can be removed by scraping it with a dull object such as a credit card or your fingernail. Do not use tweezers. Tweezers can squeeze the stinger and release more insect venom into the skin.   After the stinger has been removed, wash the sting site with soap and water or rubbing alcohol.   Put ice on the sting area.   Put ice in a plastic bag.   Place a towel between your skin and the bag.   Leave the ice on for 15 to 20 minutes, 3 to 4 times a day.   You can use a topical anti-itch cream, such as hydrocortisone cream, to help reduce itching.   You can take an oral antihistamine medicine to help decrease swelling and other symptoms.   Only take over-the-counter or prescription medicines for pain, discomfort, or fever as directed by your caregiver.   If prescribed, keep an epinephrine injection to temporarily treat emergency allergic reactions with you at all times. It is important to know how and when to give an epinephrine injection.   Avoid contact with stinging insects or the  insect thought to have caused your reaction.   Wear long pants when mowing grass or hiking. Wear gloves when gardening.   Use unscented deodorant and avoid strong perfumes when outdoors.   Wear a medical alert bracelet or necklace that describes your allergies.   Make sure your primary caregiver has a record of your insect sting reaction.   It may be helpful to consult with an allergy specialist. You may have other sensitivities that you are not aware of.  SEEK IMMEDIATE MEDICAL CARE IF:   You experience wheezing or difficulty breathing.   You have difficulty swallowing, or you develop throat tightness.   You have mouth, tongue, or throat swelling.   You feel weak, or you faint.   You have coughing or a change in your voice.   You experience vomiting, diarrhea, or stomach cramps.   You have chest pain or lightheadedness.   You notice raised, red patches on the skin that itch.  These may be early warning signs of a serious generalized or anaphylactic reaction. Call your local emergency services (911 in [place] immediately.  MAKE SURE YOU:    Understand these instructions.   Will watch your condition.   Will get help right away if you are not doing well or get worse.  FOR MORE   INFORMATION  American Academy of Allergy Asthma and Immunology: www.aaaai.org  American College of Allergy, Asthma and Immunology: www.acaai.org  Document Released: 05/17/2006 Document Revised: 06/07/2011 Document Reviewed: 06/28/2009  ExitCare Patient Information 2012 [employer].

## 2012-01-29 NOTE — Progress Notes (Signed)
  Subjective:    Patient ID: Stephanie Cortez, female    DOB: 11-04-1933, 78 y.o.   MRN: [id number]  HPI Pt here c/o possible spider bite yesterday. Pt was working in her basement and there were spiders---she felt something bite her and when she went to bed last night she noticed her lip was swollen and this am it was worse.  No trouble breathing, no chest pain, no sob.    Review of Systems As above    Objective:   Physical Exam  Constitutional: She is oriented to person, place, and time. She appears well-developed and well-nourished.  HENT:       Left side bottom lip swollen and ? Bite marks   Cardiovascular: Normal rate, regular rhythm and normal heart sounds.   No murmur heard. Pulmonary/Chest: Effort normal and breath sounds normal. No respiratory distress. She has no wheezes. She has no rales. She exhibits no tenderness.  Neurological: She is alert and oriented to person, place, and time.  Psychiatric: She has a normal mood and affect. Her behavior is normal. Judgment and thought content normal.          Assessment & Plan:  ? Spider bite--- pred taper and depo medrol                         Benadryl                     Ice                      rto prn

## 2012-01-31 DIAGNOSIS — R51 Headache: Secondary | ICD-10-CM | POA: Diagnosis not present

## 2012-01-31 DIAGNOSIS — H6980 Other specified disorders of Eustachian tube, unspecified ear: Secondary | ICD-10-CM | POA: Diagnosis not present

## 2012-01-31 DIAGNOSIS — J322 Chronic ethmoidal sinusitis: Secondary | ICD-10-CM | POA: Diagnosis not present

## 2012-02-01 ENCOUNTER — Telehealth: Payer: Self-pay | Admitting: Family Medicine

## 2012-02-01 NOTE — Telephone Encounter (Signed)
Caller: Stephanie Cortez/Patient; PCP: [name].; CB#: [phone];  Call regarding Alt #[phone]; Chronic  Intermittent Dizziness/Lightheadness.  Symtpoms worse on 01/28/12.  Seen 01/28/12 for Spider Bite On Lower Lip; On Prednisone; lip edema subsided.  On Meclazine prn dizziness.  Saw ENT, Dr Ezzard Standing 01/31/12; sinus/skull xrays pending.  States no life threatening problems.  Declined triage. Informed dizziness may be side effect of Prednisone per Med Advisor. Requesting appt for week of 02/04/12 with Dr Laury Axon.  Transferred to [facility] for caller requesting appt, triage offered and declined per PCP Call, No Triage Guideline.

## 2012-02-06 ENCOUNTER — Encounter: Payer: Self-pay | Admitting: Internal Medicine

## 2012-02-06 ENCOUNTER — Ambulatory Visit (INDEPENDENT_AMBULATORY_CARE_PROVIDER_SITE_OTHER): Payer: BC Managed Care – PPO | Admitting: Family Medicine

## 2012-02-06 ENCOUNTER — Encounter: Payer: Self-pay | Admitting: Family Medicine

## 2012-02-06 VITALS — BP 120/70 | HR 83 | Temp 98.2°F | Wt 187.6 lb

## 2012-02-06 DIAGNOSIS — K649 Unspecified hemorrhoids: Secondary | ICD-10-CM | POA: Diagnosis not present

## 2012-02-06 DIAGNOSIS — B370 Candidal stomatitis: Secondary | ICD-10-CM | POA: Diagnosis not present

## 2012-02-06 DIAGNOSIS — R319 Hematuria, unspecified: Secondary | ICD-10-CM | POA: Diagnosis not present

## 2012-02-06 DIAGNOSIS — R112 Nausea with vomiting, unspecified: Secondary | ICD-10-CM

## 2012-02-06 DIAGNOSIS — K5732 Diverticulitis of large intestine without perforation or abscess without bleeding: Secondary | ICD-10-CM | POA: Diagnosis not present

## 2012-02-06 DIAGNOSIS — K5792 Diverticulitis of intestine, part unspecified, without perforation or abscess without bleeding: Secondary | ICD-10-CM

## 2012-02-06 LAB — POCT URINALYSIS DIPSTICK
Bilirubin, UA: NEGATIVE
Glucose, UA: NEGATIVE
Ketones, UA: NEGATIVE
Leukocytes, UA: NEGATIVE
Nitrite, UA: NEGATIVE
Protein, UA: NEGATIVE
Spec Grav, UA: <=1.005
Urobilinogen, UA: 0.2
pH, UA: 7

## 2012-02-06 MED ORDER — SULFAMETHOXAZOLE-TRIMETHOPRIM 800-160 MG PO TABS: 1.0000 | ORAL_TABLET | Freq: Two times a day (BID) | ORAL | Status: AC

## 2012-02-06 MED ORDER — NYSTATIN 100000 UNIT/ML MT SUSP: 500000.0000 [IU] | Freq: Four times a day (QID) | OROMUCOSAL | Status: DC

## 2012-02-06 MED ORDER — METRONIDAZOLE 500 MG PO TABS: 500.0000 mg | ORAL_TABLET | Freq: Three times a day (TID) | ORAL | Status: AC

## 2012-02-06 MED ORDER — HYDROCORTISONE 2.5 % RE CREA: TOPICAL_CREAM | Freq: Two times a day (BID) | RECTAL | Status: AC

## 2012-02-06 MED ORDER — ONDANSETRON 8 MG PO TBDP: 8.0000 mg | ORAL_TABLET | Freq: Three times a day (TID) | ORAL | Status: AC | PRN

## 2012-02-06 NOTE — Patient Instructions (Addendum)
Diverticulitis A diverticulum is a small pouch or sac on the colon. Diverticulosis is the presence of these diverticula on the colon. Diverticulitis is the irritation (inflammation) or infection of diverticula. CAUSES  The colon and its diverticula contain bacteria. If food particles block the tiny opening to a diverticulum, the bacteria inside can grow and cause an increase in pressure. This leads to infection and inflammation and is called diverticulitis. SYMPTOMS   Abdominal pain and tenderness. Usually, the pain is located on the left side of your abdomen. However, it could be located elsewhere.   Fever.   Bloating.   Feeling sick to your stomach (nausea).   Throwing up (vomiting).   Abnormal stools.  DIAGNOSIS  Your caregiver will take a history and perform a physical exam. Since many things can cause abdominal pain, other tests may be necessary. Tests may include:  Blood tests.   Urine tests.   X-ray of the abdomen.   CT scan of the abdomen.  Sometimes, surgery is needed to determine if diverticulitis or other conditions are causing your symptoms. TREATMENT  Most of the time, you can be treated without surgery. Treatment includes:  Resting the bowels by only having liquids for a few days. As you improve, you will need to eat a low-fiber diet.   Intravenous (IV) fluids if you are losing body fluids (dehydrated).   Antibiotic medicines that treat infections may be given.   Pain and nausea medicine, if needed.   Surgery if the inflamed diverticulum has burst.  HOME CARE INSTRUCTIONS   Try a clear liquid diet (broth, tea, or water for as long as directed by your caregiver). You may then gradually begin a low-fiber diet as tolerated. A low-fiber diet is a diet with less than 10 grams of fiber. Choose the foods below to reduce fiber in the diet:   White breads, cereals, rice, and pasta.   Cooked fruits and vegetables or soft fresh fruits and vegetables without the skin.     Ground or well-cooked tender beef, ham, veal, lamb, pork, or poultry.   Eggs and seafood.   After your diverticulitis symptoms have improved, your caregiver may put you on a high-fiber diet. A high-fiber diet includes 14 grams of fiber for every 1000 calories consumed. For a standard 2000 calorie diet, you would need 28 grams of fiber. Follow these diet guidelines to help you increase the fiber in your diet. It is important to slowly increase the amount fiber in your diet to avoid gas, constipation, and bloating.   Choose whole-grain breads, cereals, pasta, and brown rice.   Choose fresh fruits and vegetables with the skin on. Do not overcook vegetables because the more vegetables are cooked, the more fiber is lost.   Choose more nuts, seeds, legumes, dried peas, beans, and lentils.   Look for food products that have greater than 3 grams of fiber per serving on the Nutrition Facts label.   Take all medicine as directed by your caregiver.   If your caregiver has given you a follow-up appointment, it is very important that you go. Not going could result in lasting (chronic) or permanent injury, pain, and disability. If there is any problem keeping the appointment, call to reschedule.  SEEK MEDICAL CARE IF:   Your pain does not improve.   You have a hard time advancing your diet beyond clear liquids.   Your bowel movements do not return to normal.  SEEK IMMEDIATE MEDICAL CARE IF:   Your pain becomes   worse.   You have an oral temperature above 102 F (38.9 C), not controlled by medicine.   You have repeated vomiting.   You have bloody or black, tarry stools.   Symptoms that brought you to your caregiver become worse or are not getting better.  MAKE SURE YOU:   Understand these instructions.   Will watch your condition.   Will get help right away if you are not doing well or get worse.  Document Released: 03/28/2005 Document Revised: 06/07/2011 Document Reviewed:  07/24/2010 ExitCare Patient Information 2012 [employer]. 

## 2012-02-06 NOTE — Progress Notes (Signed)
  Subjective:     Stephanie Cortez is a 78 y.o. female who presents for evaluation of abdominal pain. Onset was 2 days ago. Symptoms have been gradually worsening. The pain is described as dull, and is 3/10 in intensity. Pain is located in the LLQ without radiation.  Aggravating factors: bowel movement.  Alleviating factors: bowel movements. Associated symptoms: none. The patient denies constipation and diarrhea.  The patient's history has been marked as reviewed and updated as appropriate.  Review of Systems Pertinent items are noted in HPI.     Objective:    BP 120/70  Pulse 83  Temp 98.2 F (36.8 C) (Oral)  Wt 187 lb 9.6 oz (85.095 kg)  SpO2 98% General appearance: AAOx3  NAD Abdomen: + LLQ pain with guarding  No rebound, + BS Rectal: Not done   Assessment:    Abdominal pain, likely secondary to diverticulitis.   THrush -- nystatin swish and spit Plan:    Pt has had 2 CT scans in the last several months +diverticulosis---will treat empirically with bactrim and flagyl for diverticulitis If no better we may need to repeat CT vs f/u GI zofram for nausea and urine sent for culture

## 2012-02-08 ENCOUNTER — Telehealth: Payer: Self-pay | Admitting: Family Medicine

## 2012-02-08 LAB — URINE CULTURE
Colony Count: NO GROWTH
Organism ID, Bacteria: NO GROWTH

## 2012-02-08 NOTE — Telephone Encounter (Signed)
To MD for review     KP 

## 2012-02-08 NOTE — Telephone Encounter (Signed)
She can discuss with Dr Jarold Motto-- does she need new referral

## 2012-02-08 NOTE — Telephone Encounter (Signed)
Pt does not want come across demanding at all-(she wanted me to let you know this) BUT she is really concern about the two strong meds that Lowne wants her to take. She has taken them in the past and really doesn't like them. She will do whatever Laury Axon tells her. But she would agree to go back to [place] (Lowne's suggestion) to also talk about different meds if it would mean she didn't have to take the meds. She is really concern that Lowne know she is not second guessing her in anyway just really doesn't want those meds. amym

## 2012-02-08 NOTE — Telephone Encounter (Signed)
Discussed with patient and she stated she will call Dr.Patterson and get and apt on Monday to discuss.      KP

## 2012-02-14 ENCOUNTER — Other Ambulatory Visit: Payer: Self-pay | Admitting: Otolaryngology

## 2012-02-14 ENCOUNTER — Ambulatory Visit (INDEPENDENT_AMBULATORY_CARE_PROVIDER_SITE_OTHER): Payer: BC Managed Care – PPO | Admitting: Pharmacist

## 2012-02-14 DIAGNOSIS — I4891 Unspecified atrial fibrillation: Secondary | ICD-10-CM | POA: Diagnosis not present

## 2012-02-14 DIAGNOSIS — J329 Chronic sinusitis, unspecified: Secondary | ICD-10-CM

## 2012-02-14 LAB — POCT INR: INR: 3.4

## 2012-02-21 ENCOUNTER — Ambulatory Visit: Payer: BC Managed Care – PPO | Admitting: Gastroenterology

## 2012-02-21 ENCOUNTER — Other Ambulatory Visit: Payer: BC Managed Care – PPO

## 2012-02-25 ENCOUNTER — Ambulatory Visit
Admission: RE | Admit: 2012-02-25 | Discharge: 2012-02-25 | Disposition: A | Discharge status: Home / Self Care | Payer: BC Managed Care – PPO | Source: Ambulatory Visit | Attending: Otolaryngology | Admitting: Otolaryngology

## 2012-02-25 DIAGNOSIS — J329 Chronic sinusitis, unspecified: Secondary | ICD-10-CM

## 2012-02-26 ENCOUNTER — Other Ambulatory Visit: Payer: Self-pay | Admitting: Family Medicine

## 2012-02-26 NOTE — Telephone Encounter (Signed)
Last seen 02/06/12 and filled 01/14/12 # 90. Please advise      KP

## 2012-02-28 ENCOUNTER — Ambulatory Visit (INDEPENDENT_AMBULATORY_CARE_PROVIDER_SITE_OTHER): Payer: BC Managed Care – PPO | Admitting: Pharmacist

## 2012-02-28 DIAGNOSIS — H43399 Other vitreous opacities, unspecified eye: Secondary | ICD-10-CM | POA: Diagnosis not present

## 2012-02-28 DIAGNOSIS — H04129 Dry eye syndrome of unspecified lacrimal gland: Secondary | ICD-10-CM | POA: Diagnosis not present

## 2012-02-28 DIAGNOSIS — H02409 Unspecified ptosis of unspecified eyelid: Secondary | ICD-10-CM | POA: Diagnosis not present

## 2012-02-28 DIAGNOSIS — I4891 Unspecified atrial fibrillation: Secondary | ICD-10-CM

## 2012-02-28 DIAGNOSIS — H40019 Open angle with borderline findings, low risk, unspecified eye: Secondary | ICD-10-CM | POA: Diagnosis not present

## 2012-02-28 DIAGNOSIS — H1045 Other chronic allergic conjunctivitis: Secondary | ICD-10-CM | POA: Diagnosis not present

## 2012-02-28 LAB — POCT INR: INR: 4.3

## 2012-03-04 ENCOUNTER — Ambulatory Visit (INDEPENDENT_AMBULATORY_CARE_PROVIDER_SITE_OTHER): Payer: BC Managed Care – PPO | Admitting: Gastroenterology

## 2012-03-04 ENCOUNTER — Encounter: Payer: Self-pay | Admitting: Gastroenterology

## 2012-03-04 VITALS — BP 132/64 | HR 80 | Ht 67.0 in | Wt 192.0 lb

## 2012-03-04 DIAGNOSIS — K219 Gastro-esophageal reflux disease without esophagitis: Secondary | ICD-10-CM | POA: Diagnosis not present

## 2012-03-04 DIAGNOSIS — R1313 Dysphagia, pharyngeal phase: Secondary | ICD-10-CM

## 2012-03-04 DIAGNOSIS — K589 Irritable bowel syndrome without diarrhea: Secondary | ICD-10-CM | POA: Diagnosis not present

## 2012-03-04 DIAGNOSIS — K5730 Diverticulosis of large intestine without perforation or abscess without bleeding: Secondary | ICD-10-CM | POA: Diagnosis not present

## 2012-03-04 DIAGNOSIS — R1032 Left lower quadrant pain: Secondary | ICD-10-CM | POA: Diagnosis not present

## 2012-03-04 MED ORDER — HYOSCYAMINE SULFATE 0.125 MG SL SUBL: 0.1250 mg | SUBLINGUAL_TABLET | SUBLINGUAL | Status: DC | PRN

## 2012-03-04 MED ORDER — PSYLLIUM 58.6 % PO POWD: 1.0000 | Freq: Every day | ORAL | Status: DC

## 2012-03-04 NOTE — Patient Instructions (Addendum)
You have seen the movie on diverticulosis. Your prescriptions have been sent to your pharmacy. Information on high fiber diet has been given to you. [facility] phone number is [phone]. CC:  Loreen Freud MD       High Fiber Diet A high fiber diet changes your normal diet to include more whole grains, legumes, fruits, and vegetables. Changes in the diet involve replacing refined carbohydrates with unrefined foods. The calorie level of the diet is essentially unchanged. The Dietary Reference Intake (recommended amount) for adult males is 38 g per day. For adult females, it is 25 g per day. Pregnant and lactating women should consume 28 g of fiber per day. Fiber is the intact part of a plant that is not broken down during digestion. Functional fiber is fiber that has been isolated from the plant to provide a beneficial effect in the body. PURPOSE  Increase stool bulk.   Ease and regulate bowel movements.   Lower cholesterol.  INDICATIONS THAT YOU NEED MORE FIBER  Constipation and hemorrhoids.   Uncomplicated diverticulosis (intestine condition) and irritable bowel syndrome.   Weight management.   As a protective measure against hardening of the arteries (atherosclerosis), diabetes, and cancer.  NOTE OF CAUTION If you have a digestive or bowel problem, ask your caregiver for advice before adding high fiber foods to your diet. Some of the following medical problems are such that a high fiber diet should not be used without consulting your caregiver:  Acute diverticulitis (intestine infection).   Partial small bowel obstructions.   Complicated diverticular disease involving bleeding, rupture (perforation), or abscess (boil, furuncle).   Presence of autonomic neuropathy (nerve damage) or gastric paresis (stomach cannot empty itself).  GUIDELINES FOR INCREASING FIBER  Start adding fiber to the diet slowly. A gradual increase of about 5 more grams (2 slices of  whole-wheat bread, 2 servings of most fruits or vegetables, or 1 bowl of high fiber cereal) per day is best. Too rapid an increase in fiber may result in constipation, flatulence, and bloating.   Drink enough water and fluids to keep your urine clear or pale yellow. Water, juice, or caffeine-free drinks are recommended. Not drinking enough fluid may cause constipation.   Eat a variety of high fiber foods rather than one type of fiber.   Try to increase your intake of fiber through using high fiber foods rather than fiber pills or supplements that contain small amounts of fiber.   The goal is to change the types of food eaten. Do not supplement your present diet with high fiber foods, but replace foods in your present diet.  INCLUDE A VARIETY OF FIBER SOURCES  Replace refined and processed grains with whole grains, canned fruits with fresh fruits, and incorporate other fiber sources. White rice, white breads, and most bakery goods contain little or no fiber.   Brown whole-grain rice, buckwheat oats, and many fruits and vegetables are all good sources of fiber. These include: broccoli, Brussels sprouts, cabbage, cauliflower, beets, sweet potatoes, white potatoes (skin on), carrots, tomatoes, eggplant, squash, berries, fresh fruits, and dried fruits.   Cereals appear to be the richest source of fiber. Cereal fiber is found in whole grains and bran. Bran is the fiber-rich outer coat of cereal grain, which is largely removed in refining. In whole-grain cereals, the bran remains. In breakfast cereals, the largest amount of fiber is found in those with "bran" in their names. The fiber content is sometimes indicated on the label.  You may need to include additional fruits and vegetables each day.   In baking, for 1 cup white flour, you may use the following substitutions:   1 cup whole-wheat flour minus 2 tbs.    cup white flour plus  cup whole-wheat flour.  Document Released: 06/18/2005 Document  Revised: 06/07/2011 Document Reviewed: 04/26/2009 [facility] Patient Information 2012 [place], [place].

## 2012-03-04 NOTE — Progress Notes (Signed)
This is a very complicated 78 year old Caucasian female chronically anticoagulated because of a history of atrial fibrillation. She has chronic GERD managed with daily Prilosec 20 mg, chronic bronchiectasis, and in the spring of this year had a viral gastroenteritis with associated nausea vomiting, diarrhea, dehydration. Subsequently she had activation of IBS with left lower quadrant pain and soft pellett-like stools. Colonoscopy in May of this year was unremarkable except for diverticulosis. She also had CT scan of her abdomen performed around that time that was otherwise unremarkable. Because of continued dysphagia, she underwent endoscopy, we were unable to intubate her esophagus because of severe cricopharyngeal spasm. This is confirmed by barium swallow, and under the care of Dr. Ovidio Kin in ENT, she has had Botox injections in her cricopharyngeus with good symptomatic improvement. The patient and her mind was previously consumed with assumption is that she had H. pylori infection. She has no dyspepsia or upper GI complaints at this time. She continues with occasional spasmodic left lower quadrant pain and hard pellet-like stools but no rectal bleeding, melena, or systemic complaints.  Current Medications, Allergies, Past Medical History, Past Surgical History, Family History and Social History were reviewed in [employer] record.  Pertinent Review of Systems Negative   Physical Exam: Healthy-appearing patient in no acute distress. Blood pressure 132/64, pulse 80 and regular, weight 192 pounds with BMI of 30.07. Abdominal exam shows no distention, organomegaly, masses or tenderness. Bowel sounds are entirely normal. Mental status is normal.    Assessment and Plan: Chronic left colon diverticulosis with intermittent colonic spasms and IBS. I've stressed importance of a high fiber diet, liberal by mouth fluids, daily Metamucil, and when necessary sublingual Levsin. She will  see me again in 2 months time for followup. I do not think she needs further workup for H. pylori at this time. Her acid reflux symptoms are well-controlled with daily Prilosec. She is followed by Dr. Ezzard Standing for her dysphagia and cricopharyngeal dysfunction. The patient apparently wants to change her primary care to [facility] because of convenience issues.  Please copy Dr. Ovidio Kin in ENT.  No diagnosis found.

## 2012-03-08 ENCOUNTER — Other Ambulatory Visit: Payer: Self-pay | Admitting: Internal Medicine

## 2012-03-10 ENCOUNTER — Ambulatory Visit (INDEPENDENT_AMBULATORY_CARE_PROVIDER_SITE_OTHER): Payer: BC Managed Care – PPO

## 2012-03-10 ENCOUNTER — Other Ambulatory Visit: Payer: Self-pay | Admitting: Family Medicine

## 2012-03-10 ENCOUNTER — Other Ambulatory Visit: Payer: Self-pay | Admitting: *Deleted

## 2012-03-10 ENCOUNTER — Other Ambulatory Visit: Payer: Self-pay | Admitting: Internal Medicine

## 2012-03-10 DIAGNOSIS — I4891 Unspecified atrial fibrillation: Secondary | ICD-10-CM

## 2012-03-10 LAB — POCT INR: INR: 2.7

## 2012-03-10 MED ORDER — DILTIAZEM HCL ER BEADS 300 MG PO CP24: 300.0000 mg | ORAL_CAPSULE | Freq: Every day | ORAL | Status: DC

## 2012-03-10 NOTE — Telephone Encounter (Signed)
Refill done.  

## 2012-03-28 DIAGNOSIS — R0602 Shortness of breath: Secondary | ICD-10-CM | POA: Diagnosis not present

## 2012-03-28 DIAGNOSIS — E871 Hypo-osmolality and hyponatremia: Secondary | ICD-10-CM | POA: Diagnosis not present

## 2012-03-31 ENCOUNTER — Telehealth: Payer: Self-pay | Admitting: Family Medicine

## 2012-03-31 ENCOUNTER — Ambulatory Visit (INDEPENDENT_AMBULATORY_CARE_PROVIDER_SITE_OTHER): Payer: BC Managed Care – PPO | Admitting: *Deleted

## 2012-03-31 DIAGNOSIS — I4891 Unspecified atrial fibrillation: Secondary | ICD-10-CM

## 2012-03-31 LAB — POCT INR: INR: 3

## 2012-03-31 NOTE — Telephone Encounter (Signed)
Please advise      KP 

## 2012-03-31 NOTE — Telephone Encounter (Signed)
Patient needs post hospital follow up visit this week dx low sodium. She went to ED in [place] and needs her sodium level re-checked.

## 2012-03-31 NOTE — Telephone Encounter (Signed)
Can be 15 min visit if she has ER records.

## 2012-03-31 NOTE — Telephone Encounter (Signed)
lmovm for pt to call office. °

## 2012-04-01 NOTE — Telephone Encounter (Signed)
Pt will bring ED records. Scheduled for Thursday, 04/03/12.

## 2012-04-03 ENCOUNTER — Ambulatory Visit: Payer: BC Managed Care – PPO | Admitting: Internal Medicine

## 2012-04-03 ENCOUNTER — Encounter: Payer: Self-pay | Admitting: Family Medicine

## 2012-04-03 ENCOUNTER — Ambulatory Visit (INDEPENDENT_AMBULATORY_CARE_PROVIDER_SITE_OTHER): Payer: BC Managed Care – PPO | Admitting: Family Medicine

## 2012-04-03 VITALS — BP 118/76 | HR 85 | Temp 98.3°F | Wt 186.0 lb

## 2012-04-03 DIAGNOSIS — I4891 Unspecified atrial fibrillation: Secondary | ICD-10-CM

## 2012-04-03 DIAGNOSIS — Z23 Encounter for immunization: Secondary | ICD-10-CM

## 2012-04-03 DIAGNOSIS — E871 Hypo-osmolality and hyponatremia: Secondary | ICD-10-CM | POA: Diagnosis not present

## 2012-04-03 DIAGNOSIS — E039 Hypothyroidism, unspecified: Secondary | ICD-10-CM | POA: Diagnosis not present

## 2012-04-03 DIAGNOSIS — R42 Dizziness and giddiness: Secondary | ICD-10-CM | POA: Diagnosis not present

## 2012-04-03 MED ORDER — LEVOTHYROXINE SODIUM 50 MCG PO TABS: 50.0000 ug | ORAL_TABLET | Freq: Every day | ORAL | Status: DC

## 2012-04-03 MED ORDER — LORAZEPAM 0.5 MG PO TABS: 0.5000 mg | ORAL_TABLET | Freq: Three times a day (TID) | ORAL | Status: DC

## 2012-04-03 MED ORDER — MECLIZINE HCL 25 MG PO TABS: 25.0000 mg | ORAL_TABLET | Freq: Three times a day (TID) | ORAL | Status: DC | PRN

## 2012-04-03 NOTE — Assessment & Plan Note (Signed)
She will f/u with Dr Graciela Husbands next week Off lanoxin

## 2012-04-03 NOTE — Patient Instructions (Signed)
Hyponatremia   Hyponatremia is when the amount of salt (sodium) in your blood is too low. When sodium levels are low, your cells will absorb extra water and swell. The swelling happens throughout the body, but it mostly affects the brain. Severe brain swelling (cerebral edema), seizures, or coma can happen.   CAUSES    Heart, kidney, or liver problems.   Thyroid problems.   Adrenal gland problems.   Severe vomiting and diarrhea.   Certain medicines or illegal drugs.   Dehydration.   Drinking too much water.   Low-sodium diet.  SYMPTOMS    Nausea and vomiting.   Confusion.   Lethargy.   Agitation.   Headache.   Twitching or shaking (seizures).   Unconsciousness.   Appetite loss.   Muscle weakness and cramping.  DIAGNOSIS   Hyponatremia is identified by a simple blood test. Your caregiver will perform a history and physical exam to try to find the cause and type of hyponatremia. Other tests may be needed to measure the amount of sodium in your blood and urine.  TREATMENT   Treatment will depend on the cause.    Fluids may be given through the vein (IV).   Medicines may be used to correct the sodium imbalance. If medicines are causing the problem, they will need to be adjusted.   Water or fluid intake may be restricted to restore proper balance.  The speed of correcting the sodium problem is very important. If the problem is corrected too fast, nerve damage (sometimes unchangeable) can happen.  HOME CARE INSTRUCTIONS    Only take medicines as directed by your caregiver. Many medicines can make hyponatremia worse. Discuss all your medicines with your caregiver.   Carefully follow any recommended diet, including any fluid restrictions.   You may be asked to repeat lab tests. Follow these directions.   Avoid alcohol and recreational drugs.  SEEK MEDICAL CARE IF:    You develop worsening nausea, fatigue, headache, confusion, or weakness.   Your original hyponatremia symptoms return.   You have  problems following the recommended diet.  SEEK IMMEDIATE MEDICAL CARE IF:    You have a seizure.   You faint.   You have ongoing diarrhea or vomiting.  MAKE SURE YOU:    Understand these instructions.   Will watch your condition.   Will get help right away if you are not doing well or get worse.  Document Released: 06/08/2002 Document Revised: 09/10/2011 Document Reviewed: 12/03/2010  ExitCare Patient Information 2013 [employer].

## 2012-04-03 NOTE — Progress Notes (Signed)
  Subjective:    Patient ID: Stephanie Cortez, female    DOB: 03/09/1934, 78 y.o.   MRN: [id number]  HPI Pt here to f/u from ER in [place].  Pt felt her heart racing and saw her sisters cardiologist while she was there---- Dr Reubin [place], [place] [phone].  He started her on digoxin---the next day she felt dizzy and weak.  She went to ER and NA was found to be low Pt has f/u appointment with Dr Graciela Husbands next week.  She stopped lanoxin and was told to f/u here to have Na rechecked. All symptoms have resolved.    Review of Systems    as above Objective:   Physical Exam  Constitutional: She is oriented to person, place, and time. She appears well-developed and well-nourished.  Cardiovascular: Normal rate, regular rhythm and normal heart sounds.   Pulmonary/Chest: Effort normal and breath sounds normal.  Musculoskeletal: She exhibits no edema and no tenderness.  Neurological: She is alert and oriented to person, place, and time.  Psychiatric: She has a normal mood and affect. Her behavior is normal. Judgment and thought content normal.          Assessment & Plan:

## 2012-04-03 NOTE — Assessment & Plan Note (Signed)
Recheck labs 

## 2012-04-04 LAB — BASIC METABOLIC PANEL
BUN: 8 mg/dL (ref 6–23)
CO2: 27 mEq/L (ref 19–32)
Calcium: 9.6 mg/dL (ref 8.4–10.5)
Chloride: 97 mEq/L (ref 96–112)
Creatinine, Ser: 0.6 mg/dL (ref 0.4–1.2)
GFR: 99.29 mL/min (ref >60.00)
Glucose, Bld: 94 mg/dL (ref 70–99)
Potassium: 4.4 mEq/L (ref 3.5–5.1)
Sodium: 129 mEq/L — ABNORMAL LOW (ref 135–145)

## 2012-04-05 ENCOUNTER — Other Ambulatory Visit: Payer: Self-pay | Admitting: Family Medicine

## 2012-04-07 ENCOUNTER — Encounter (HOSPITAL_COMMUNITY): Payer: Self-pay | Admitting: *Deleted

## 2012-04-07 ENCOUNTER — Emergency Department (HOSPITAL_COMMUNITY): Payer: BC Managed Care – PPO

## 2012-04-07 ENCOUNTER — Emergency Department (HOSPITAL_COMMUNITY)
Admission: EM | Admit: 2012-04-07 | Discharge: 2012-04-07 | Disposition: A | Discharge status: Home / Self Care | Payer: BC Managed Care – PPO | Attending: Emergency Medicine | Admitting: Emergency Medicine

## 2012-04-07 DIAGNOSIS — J4489 Other specified chronic obstructive pulmonary disease: Secondary | ICD-10-CM | POA: Insufficient documentation

## 2012-04-07 DIAGNOSIS — R209 Unspecified disturbances of skin sensation: Secondary | ICD-10-CM | POA: Diagnosis not present

## 2012-04-07 DIAGNOSIS — Z7901 Long term (current) use of anticoagulants: Secondary | ICD-10-CM | POA: Diagnosis not present

## 2012-04-07 DIAGNOSIS — E639 Nutritional deficiency, unspecified: Secondary | ICD-10-CM | POA: Insufficient documentation

## 2012-04-07 DIAGNOSIS — R42 Dizziness and giddiness: Secondary | ICD-10-CM | POA: Diagnosis not present

## 2012-04-07 DIAGNOSIS — I4891 Unspecified atrial fibrillation: Secondary | ICD-10-CM | POA: Diagnosis not present

## 2012-04-07 DIAGNOSIS — R5381 Other malaise: Secondary | ICD-10-CM | POA: Insufficient documentation

## 2012-04-07 DIAGNOSIS — R404 Transient alteration of awareness: Secondary | ICD-10-CM | POA: Diagnosis not present

## 2012-04-07 DIAGNOSIS — J449 Chronic obstructive pulmonary disease, unspecified: Secondary | ICD-10-CM | POA: Diagnosis not present

## 2012-04-07 DIAGNOSIS — R202 Paresthesia of skin: Secondary | ICD-10-CM

## 2012-04-07 DIAGNOSIS — R5383 Other fatigue: Secondary | ICD-10-CM

## 2012-04-07 DIAGNOSIS — E871 Hypo-osmolality and hyponatremia: Secondary | ICD-10-CM | POA: Diagnosis not present

## 2012-04-07 DIAGNOSIS — Z79899 Other long term (current) drug therapy: Secondary | ICD-10-CM | POA: Insufficient documentation

## 2012-04-07 DIAGNOSIS — F411 Generalized anxiety disorder: Secondary | ICD-10-CM | POA: Diagnosis not present

## 2012-04-07 DIAGNOSIS — F419 Anxiety disorder, unspecified: Secondary | ICD-10-CM

## 2012-04-07 DIAGNOSIS — R531 Weakness: Secondary | ICD-10-CM

## 2012-04-07 LAB — PROTIME-INR
INR: 2.98 — ABNORMAL HIGH (ref 0.00–1.49)
Prothrombin Time: 29.4 seconds — ABNORMAL HIGH (ref 11.6–15.2)

## 2012-04-07 LAB — COMPREHENSIVE METABOLIC PANEL
ALT: 25 U/L (ref 0–35)
AST: 25 U/L (ref 0–37)
Albumin: 4.2 g/dL (ref 3.5–5.2)
Alkaline Phosphatase: 93 U/L (ref 39–117)
BUN: 7 mg/dL (ref 6–23)
CO2: 22 mEq/L (ref 19–32)
Calcium: 10 mg/dL (ref 8.4–10.5)
Chloride: 91 mEq/L — ABNORMAL LOW (ref 96–112)
Creatinine, Ser: 0.57 mg/dL (ref 0.50–1.10)
GFR calc Af Amer: >90 mL/min (ref >90)
GFR calc non Af Amer: 88 mL/min — ABNORMAL LOW (ref >90)
Glucose, Bld: 104 mg/dL — ABNORMAL HIGH (ref 70–99)
Potassium: 4 mEq/L (ref 3.5–5.1)
Sodium: 127 mEq/L — ABNORMAL LOW (ref 135–145)
Total Bilirubin: 0.5 mg/dL (ref 0.3–1.2)
Total Protein: 7.2 g/dL (ref 6.0–8.3)

## 2012-04-07 LAB — URINE MICROSCOPIC-ADD ON

## 2012-04-07 LAB — URINALYSIS, ROUTINE W REFLEX MICROSCOPIC
Bilirubin Urine: NEGATIVE
Glucose, UA: NEGATIVE mg/dL
Ketones, ur: NEGATIVE mg/dL
Leukocytes, UA: NEGATIVE
Nitrite: NEGATIVE
Protein, ur: NEGATIVE mg/dL
Specific Gravity, Urine: 1.006 (ref 1.005–1.030)
Urobilinogen, UA: 0.2 mg/dL (ref 0.0–1.0)
pH: 7 (ref 5.0–8.0)

## 2012-04-07 LAB — CBC WITH DIFFERENTIAL/PLATELET
Basophils Absolute: 0.1 10*3/uL (ref 0.0–0.1)
Basophils Relative: 1 % (ref 0–1)
Eosinophils Absolute: 0.1 10*3/uL (ref 0.0–0.7)
Eosinophils Relative: 1 % (ref 0–5)
HCT: 40.3 % (ref 36.0–46.0)
Hemoglobin: 14.1 g/dL (ref 12.0–15.0)
Lymphocytes Relative: 14 % (ref 12–46)
Lymphs Abs: 1.2 10*3/uL (ref 0.7–4.0)
MCH: 32.3 pg (ref 26.0–34.0)
MCHC: 35 g/dL (ref 30.0–36.0)
MCV: 92.2 fL (ref 78.0–100.0)
Monocytes Absolute: 0.8 10*3/uL (ref 0.1–1.0)
Monocytes Relative: 9 % (ref 3–12)
Neutro Abs: 6.8 10*3/uL (ref 1.7–7.7)
Neutrophils Relative %: 77 % (ref 43–77)
Platelets: 294 10*3/uL (ref 150–400)
RBC: 4.37 MIL/uL (ref 3.87–5.11)
RDW: 12.6 % (ref 11.5–15.5)
WBC: 8.9 10*3/uL (ref 4.0–10.5)

## 2012-04-07 LAB — APTT: aPTT: 53 seconds — ABNORMAL HIGH (ref 24–37)

## 2012-04-07 LAB — TROPONIN I: Troponin I: <0.3 ng/mL (ref <0.30)

## 2012-04-07 MED ORDER — SODIUM CHLORIDE 0.9 % IV BOLUS (SEPSIS)
500.0000 mL | Freq: Once | INTRAVENOUS | Status: AC
  Administered 2012-04-07: 500 mL via INTRAVENOUS

## 2012-04-07 MED ORDER — SODIUM CHLORIDE 0.9 % IV SOLN: INTRAVENOUS | Status: DC

## 2012-04-07 NOTE — Progress Notes (Signed)
Per discussion with ACT, pt cleared by psych. CSW provided patient with outpatient psychiatric resources. CSW and pt discussed helpful items to assist with choosing a psychiatrist/counslor including gender, age, specialty, and location. Pt and pt daughter thanked csw for concern and support. Pt plans to dc home today. .No further Clinical Social Work needs, signing off.   Catha Gosselin, LCSWA  [phone] 04/07/2012 7:42pm

## 2012-04-07 NOTE — ED Notes (Addendum)
Per EMS: Pt coming from home, c/o generalized weakness, intermittent nausea and head pressure. Pt reports she was in [place] visiting family lastweek,had same symptoms, went to hospital and was diagnosed with low sodium. Per EMS, pt sts she feels same way now.

## 2012-04-07 NOTE — ED Provider Notes (Signed)
Assume care of the patient from Dr. Lynelle Doctor. Patient's MRI was pending at the time Dr. Lynelle Doctor left, the MRI was normal with the exception of some minor small vessel changes - there is no acute CVA, no acute hemorrhage. Patient presented to the ER for generalized weakness and was concerned that her sodium was low because she's had low sodium in the past and her sister had a history of low sodium as well. He should sodium today was 127 and 129 earlier in the week. She relates a history of poor nutritional intake due to stress and anxiety over taking care of her ill elderly sister in [place] for the last 13 months. Patient's sister has been taken into assisted living facility however the patient still has some anxiety and feels the entire situation is overwhelming.  There are no organic reasons was switched to keep the patient hospitalized, I believe her hyponatremia is likely a result of poor nutrition while the patient continues to drink water. Patient's vital signs are within normal limits and she has an adequate blood pressure again supporting the notion of euvolemic hyponatremia. We'll have the patient followup with her cardiologist as she has an appointment next week, instructed her to try to assume a regular diet, Cardizem exercise and will have the acting provide some resources for counseling so she can discuss the events of the last 13 months with her sister. Patient and her daughter are satisfied with this medical plan, and I have answered their questions. Patient will be discharged home stable and in good condition.  Jones Skene, MD 04/07/12 1918

## 2012-04-07 NOTE — Telephone Encounter (Signed)
Last seen 04/03/12 and filled 02/26/12 # [phone]. Please advise    KP

## 2012-04-07 NOTE — ED Notes (Addendum)
Patient transported to MRI 

## 2012-04-07 NOTE — ED Provider Notes (Signed)
History     CSN: [id number]  Arrival date & time 04/07/12  1250   First MD Initiated Contact with Patient 04/07/12 1339      Chief Complaint  Patient presents with  . Weakness    (Consider location/radiation/quality/duration/timing/severity/associated sxs/prior treatment) HPI  Patient states yesterday she started feeling weak and having loss of appetite. She states it got worse yesterday afternoon. She denies feeling like she is spinning or falling. She states it is a lightheaded feeling. She states it feels worse when she stands up or walks and feels better if she sits down. She states she's had it before when her sodium was low. Patient states she has a frontal headache but also has sinusitis. She denies any visual changes, chest pain, or swelling in her extremities. She has nausea without vomiting. She states she's had mild shortness of breath for the past year. She reports about 10 days ago she was seen in ER in [place] and had a sodium of 127. She was seen by her PCP 3 days ago and her sodium was 129 when she called today. She reports she has been having problems with low sodium and and on for a year.  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  PCP Dr. Laury Axon with [place] [facility] Dr Graciela Husbands   Past Medical History  Diagnosis Date  . Bronchiectasis     >PFT 07/13/2008 in [place]  Fev 1.9L/76%, FVC 2.45L/74, Ratio 79, TLC 121%, DLCO 64%  AE BRonchiectasis - Dec 2010.New Rx:  outpatient - Feb 2011 - Rx outpatient  . Hypothyroidism   . Dyslipidemia   . Eczema   . Anemia     - Hgb 9.7gm% on 07/13/2008 in [place] -  Hgg 129gm% wiht normal irone levsl and ferritin 10/27/2008 in [place] Recurrent otitis/sinusitis  . Atrial fibrillation   . Glaucoma(365)   . Back pain 2009  . Anxiety   . Diverticulosis 11/14/2011    . COPD (chronic obstructive pulmonary disease)     never smoked  . Shortness of breath   . Hypertension   . GERD (gastroesophageal reflux disease)   . Arthritis   . IBS (irritable bowel syndrome) 11/14/2011  . Esophageal stenosis 11/14/2011  . Hemorrhoids   . H. pylori infection 10/08/2011     Past Surgical History  Procedure Date  . Cataract extraction     both  . Foot surgery 03/14/2011    gastroc slide-rt  . Total abdominal hysterectomy   . Colonoscopy   . Breast surgery     br bx  . Esophagoscopy w/ botox injection 12/11/2011    Procedure: ESOPHAGOSCOPY WITH BOTOX INJECTION;  Surgeon: Drema Halon, MD;  Location: [facility];  Service: ENT;  Laterality: N/A;  esophogoscopy with dilation, botox injection    Family History  Problem Relation Age of Onset  . Hypertension Mother   . Atrial fibrillation      siblings  . Lung cancer Brother   . Emphysema Brother   . Other Father     miner's lung  . Colon polyps Sister   . Pancreatic cancer Sister   . Kidney disease Sister     History  Substance Use Topics  . Smoking status: Never Smoker   . Smokeless tobacco: Never  Used  . Alcohol Use: No  lives alone  OB History    Grav Para Term Preterm Abortions TAB SAB Ect Mult Living                  Review of Systems  All other systems reviewed and are negative.    Allergies  Biaxin; Flagyl; Moxifloxacin; Pantoprazole sodium; and Penicillins  Home Medications   Current Outpatient Rx  Name Route Sig Dispense Refill  . BUDESONIDE-FORMOTEROL FUMARATE 80-4.5 MCG/ACT IN AERO Inhalation Inhale 2 puffs into the lungs 2 (two) times daily.    [name] Kitchen DILTIAZEM HCL ER BEADS 300 MG PO CP24 Oral Take 300 mg by mouth daily.    [name] Kitchen FLUTICASONE PROPIONATE 50 MCG/ACT NA SUSP Nasal Place 2 sprays into the nose daily.    [name] Kitchen HYOSCYAMINE SULFATE 0.125 MG SL SUBL Sublingual Place 0.125 mg under the tongue every 4 (four) hours as needed.    [name] Kitchen LEVOTHYROXINE SODIUM 50 MCG PO  TABS Oral Take 1 tablet (50 mcg total) by mouth daily. 90 tablet 1  . LORAZEPAM 0.5 MG PO TABS Oral Take 0.5 mg by mouth every 8 (eight) hours as needed. anxiety    . MAGNESIUM PO Oral Take 30 mLs by mouth daily. Takes 2 tbsp daily    . MECLIZINE HCL 25 MG PO TABS Oral Take 25 mg by mouth 3 (three) times daily as needed. vertigo    . ADULT MULTIVITAMIN LIQUID CH Oral Take 15 mLs by mouth daily.    [name] Kitchen OLMESARTAN MEDOXOMIL 40 MG PO TABS Oral Take 40 mg by mouth daily.    [name] Kitchen OMEPRAZOLE 20 MG PO CPDR Oral Take 20 mg by mouth daily.      [name] Kitchen POTASSIUM 75 MG PO TABS Oral Take 1 tablet by mouth daily. 1 tab po qd    . PRAVASTATIN SODIUM 20 MG PO TABS Oral Take 20 mg by mouth daily.    . PSYLLIUM 58.6 % PO POWD Oral Take 1 packet by mouth daily.    . WARFARIN SODIUM 2.5 MG PO TABS Oral Take 1.25-2.5 mg by mouth daily. Takes 1 tablet(2.5mg ) on tues, thurs, sat, Sunday, and takes 1/2tablets(1.25mg ) on mon. Wed, and fri      BP 150/68  Pulse 83  Temp 98.2 F (36.8 C) (Oral)  Resp 17  SpO2 100%  Vital signs normal   OrthoStatic vital signs normal   Physical Exam  Nursing note and vitals reviewed. Constitutional: She is oriented to person, place, and time. She appears well-developed and well-nourished.  Non-toxic appearance. She does not appear ill. No distress.  HENT:  Head: Normocephalic and atraumatic.  Right Ear: External ear normal.  Left Ear: External ear normal.  Nose: Nose normal. No mucosal edema or rhinorrhea.  Mouth/Throat: Oropharynx is clear and moist and mucous membranes are normal. No dental abscesses or uvula swelling.  Eyes: Conjunctivae normal and EOM are normal. Pupils are equal, round, and reactive to light.  Neck: Normal range of motion and full passive range of motion without pain. Neck supple.  Cardiovascular: Normal rate, regular rhythm and normal heart sounds.  Exam reveals no gallop and no friction rub.   No murmur heard. Pulmonary/Chest: Effort normal and breath sounds  normal. No respiratory distress. She has no wheezes. She has no rhonchi. She has no rales. She exhibits no tenderness and no crepitus.  Abdominal: Soft. Normal appearance and bowel sounds are normal. She exhibits no distension. There is no tenderness. There is no rebound and  no guarding.  Musculoskeletal: Normal range of motion. She exhibits no edema and no tenderness.       Moves all extremities well.   Neurological: She is alert and oriented to person, place, and time. She has normal strength. No cranial nerve deficit.  Skin: Skin is warm, dry and intact. No rash noted. No erythema. No pallor.  Psychiatric: She has a normal mood and affect. Her speech is normal and behavior is normal. Her mood appears not anxious.    ED Course  Procedures (including critical care time)  15:05 Pt describes something "funny" on her left side. Pt has no facial nerve deficit, no pronator drift, no weakness of grips, no motor weakness. Pt and daughter are both almost frantic.  Her blood was drawn 10 minutes ago and they want to know when it will be resulted. Reassured she is not having any symptoms of a stroke at this time. Will reassess soon.   Recheck 15:42 pt still has some intermittant tingling in her left face and her LLE, now states not in her left hand or arm.  Will get MR. Pt has no motor deficit. Patient and daughter remain again almost frantic.  16:18 Dr Amada Jupiter, neurology, agrees with MRI, states he would not change her medications, if MR is + call him back.   17:02 Dr Rulon Abide will check MRI and disposition  Results for orders placed during the hospital encounter of 04/07/12  CBC WITH DIFFERENTIAL      Component Value Range   WBC 8.9  4.0 - 10.5 K/uL   RBC 4.37  3.87 - 5.11 MIL/uL   Hemoglobin 14.1  12.0 - 15.0 g/dL   HCT 40.9  81.1 - 91.4 %   MCV 92.2  78.0 - 100.0 fL   MCH 32.3  26.0 - 34.0 pg   MCHC 35.0  30.0 - 36.0 g/dL   RDW 78.2  95.6 - 21.3 %   Platelets 294  150 - 400 K/uL    Neutrophils Relative 77  43 - 77 %   Neutro Abs 6.8  1.7 - 7.7 K/uL   Lymphocytes Relative 14  12 - 46 %   Lymphs Abs 1.2  0.7 - 4.0 K/uL   Monocytes Relative 9  3 - 12 %   Monocytes Absolute 0.8  0.1 - 1.0 K/uL   Eosinophils Relative 1  0 - 5 %   Eosinophils Absolute 0.1  0.0 - 0.7 K/uL   Basophils Relative 1  0 - 1 %   Basophils Absolute 0.1  0.0 - 0.1 K/uL  COMPREHENSIVE METABOLIC PANEL      Component Value Range   Sodium 127 (*) 135 - 145 mEq/L   Potassium 4.0  3.5 - 5.1 mEq/L   Chloride 91 (*) 96 - 112 mEq/L   CO2 22  19 - 32 mEq/L   Glucose, Bld 104 (*) 70 - 99 mg/dL   BUN 7  6 - 23 mg/dL   Creatinine, Ser 0.86  0.50 - 1.10 mg/dL   Calcium 57.8  8.4 - 46.9 mg/dL   Total Protein 7.2  6.0 - 8.3 g/dL   Albumin 4.2  3.5 - 5.2 g/dL   AST 25  0 - 37 U/L   ALT 25  0 - 35 U/L   Alkaline Phosphatase 93  39 - 117 U/L   Total Bilirubin 0.5  0.3 - 1.2 mg/dL   GFR calc non Af Amer 88 (*) >90 mL/min   GFR calc Af Amer >  90  >90 mL/min  URINALYSIS, ROUTINE W REFLEX MICROSCOPIC      Component Value Range   Color, Urine YELLOW  YELLOW   APPearance CLEAR  CLEAR   Specific Gravity, Urine 1.006  1.005 - 1.030   pH 7.0  5.0 - 8.0   Glucose, UA NEGATIVE  NEGATIVE mg/dL   Hgb urine dipstick TRACE (*) NEGATIVE   Bilirubin Urine NEGATIVE  NEGATIVE   Ketones, ur NEGATIVE  NEGATIVE mg/dL   Protein, ur NEGATIVE  NEGATIVE mg/dL   Urobilinogen, UA 0.2  0.0 - 1.0 mg/dL   Nitrite NEGATIVE  NEGATIVE   Leukocytes, UA NEGATIVE  NEGATIVE  TROPONIN I      Component Value Range   Troponin I <0.30  <0.30 ng/mL  APTT      Component Value Range   aPTT 53 (*) 24 - 37 seconds  PROTIME-INR      Component Value Range   Prothrombin Time 29.4 (*) 11.6 - 15.2 seconds   INR 2.98 (*) 0.00 - 1.49  URINE MICROSCOPIC-ADD ON      Component Value Range   RBC / HPF 0-2  <3 RBC/hpf   Bacteria, UA RARE  RARE     No results found.   Date: 04/07/2012  Rate: 80  Rhythm: atrial fibrillation  QRS Axis:  normal  Intervals: normal  ST/T Wave abnormalities: normal  Conduction Disutrbances:none  Narrative Interpretation:   Old EKG Reviewed: unchanged from 01/21/2009    1. Weakness   2. Hyponatremia   3. Tingling     Disposition per Dr Rulon Abide  Devoria Albe, MD, FACEP   MDM          Ward Givens, MD 04/07/12 [phone]

## 2012-04-07 NOTE — ED Notes (Signed)
Per Pt she started feeling bad yesterday, however this am felt much worse, pt sts while in [place] last week she had same symptoms and had to go the ED where she was told she had low sodium. When she came back she went to [facility] and had blood work done but no results yet. She called them this am and was told to come to ED.

## 2012-04-07 NOTE — ED Notes (Signed)
Pt and daughter called nurse pt c/o lt side "feeling funny " Dr Lynelle Doctor at bedside  Neg for stroke tested per NIH scale

## 2012-04-07 NOTE — ED Notes (Signed)
[id number]> Expected date:<BR> Expected time:<BR> Means of arrival:<BR> Comments:<BR> Low sodium

## 2012-04-11 ENCOUNTER — Ambulatory Visit (INDEPENDENT_AMBULATORY_CARE_PROVIDER_SITE_OTHER): Payer: BC Managed Care – PPO | Admitting: *Deleted

## 2012-04-11 ENCOUNTER — Ambulatory Visit (INDEPENDENT_AMBULATORY_CARE_PROVIDER_SITE_OTHER): Payer: BC Managed Care – PPO | Admitting: Internal Medicine

## 2012-04-11 ENCOUNTER — Encounter: Payer: Self-pay | Admitting: Internal Medicine

## 2012-04-11 VITALS — BP 132/82 | HR 74 | Ht 67.0 in | Wt 183.0 lb

## 2012-04-11 DIAGNOSIS — Z733 Stress, not elsewhere classified: Secondary | ICD-10-CM

## 2012-04-11 DIAGNOSIS — F439 Reaction to severe stress, unspecified: Secondary | ICD-10-CM

## 2012-04-11 DIAGNOSIS — F32A Depression, unspecified: Secondary | ICD-10-CM | POA: Insufficient documentation

## 2012-04-11 DIAGNOSIS — I4891 Unspecified atrial fibrillation: Secondary | ICD-10-CM

## 2012-04-11 DIAGNOSIS — F4323 Adjustment disorder with mixed anxiety and depressed mood: Secondary | ICD-10-CM | POA: Insufficient documentation

## 2012-04-11 LAB — POCT INR: INR: 3.2

## 2012-04-11 NOTE — Patient Instructions (Addendum)
Your physician wants you to follow-up in: 1 year with Dr Klein.  You will receive a reminder letter in the mail two months in advance. If you don't receive a letter, please call our office to schedule the follow-up appointment.  

## 2012-04-11 NOTE — Progress Notes (Signed)
Patient Care Team: [name], DO as PCP - General (Family Medicine)   HPI  Stephanie Cortez is a 78 y.o. female  seen in followup for long-standing history of atrial fibrillation dating back about one decade. She currently is in atrial fibrillation that is permanent. She has failed multiple cardioversions and anti arrhythmic drug trials with flecainide, amiodarone, and sotalol. She has been left in atrial fibrillation  She was in [place] a couple of weeks ago and apparently had heart racing. She was seen by a cardiologist there and was started on digoxin.  I am confused as it r through epic as there is a record of a note under the notes tablet but not under the encounter tab describing sodium having been found to be low and that to be rechecked;  sodium level was 127 4 days ago  She's been under a great deal of psychosocial stress trying to help with her sister's care.  She has been somewhat more aware of her atrial fibrillation but she wonders whether this is related to the above     Her cardiac evaluation in the past has included an echo in 2005 notable for mitral regurgitation and tricuspid regurgitation both of which were rated as moderate. In 2010 She had left atrial enlargement and normal left ventricular function. Multiple Myoview scans have demonstrated normal left ventricular function and no ischemia.   Past Medical History  Diagnosis Date  . Bronchiectasis     >PFT 07/13/2008 in [place]  Fev 1.9L/76%, FVC 2.45L/74, Ratio 79, TLC 121%, DLCO 64%  AE BRonchiectasis - Dec 2010.New Rx:  outpatient - Feb 2011 - Rx outpatient  . Hypothyroidism   . Dyslipidemia   . Eczema   . Anemia     - Hgb 9.7gm% on 07/13/2008 in [place] -  Hgg 129gm% wiht normal irone levsl and ferritin 10/27/2008 in [place] Recurrent otitis/sinusitis  . Atrial fibrillation   . Glaucoma(365)   . Back pain 2009  . Anxiety   . Diverticulosis 11/14/2011  . COPD (chronic obstructive pulmonary disease)     never  smoked  . Shortness of breath   . Hypertension   . GERD (gastroesophageal reflux disease)   . Arthritis   . IBS (irritable bowel syndrome) 11/14/2011  . Esophageal stenosis 11/14/2011  . Hemorrhoids   . H. pylori infection 10/08/2011     Past Surgical History  Procedure Date  . Cataract extraction     both  . Foot surgery 03/14/2011    gastroc slide-rt  . Total abdominal hysterectomy   . Colonoscopy   . Breast surgery     br bx  . Esophagoscopy w/ botox injection 12/11/2011    Procedure: ESOPHAGOSCOPY WITH BOTOX INJECTION;  Surgeon: Drema Halon, MD;  Location: [facility];  Service: ENT;  Laterality: N/A;  esophogoscopy with dilation, botox injection    Current Outpatient Prescriptions  Medication Sig Dispense Refill  . budesonide-formoterol (SYMBICORT) 80-4.5 MCG/ACT inhaler Inhale 2 puffs into the lungs 2 (two) times daily.      [name] Kitchen diltiazem (TIAZAC) 300 MG 24 hr capsule Take 300 mg by mouth daily.      . fluticasone (FLONASE) 50 MCG/ACT nasal spray Place 2 sprays into the nose daily.      . hyoscyamine (LEVSIN SL) 0.125 MG SL tablet Place 0.125 mg under the tongue every 4 (four) hours as needed.      [name] Kitchen levothyroxine (SYNTHROID, LEVOTHROID) 50 MCG tablet Take 1 tablet (50 mcg total)  by mouth daily.  90 tablet  1  . LORazepam (ATIVAN) 0.5 MG tablet Take 0.5 mg by mouth every 8 (eight) hours as needed. anxiety      . MAGNESIUM PO Take 30 mLs by mouth daily. Takes 2 tbsp daily      . meclizine (ANTIVERT) 25 MG tablet Take 25 mg by mouth 3 (three) times daily as needed. vertigo      . Multiple Vitamin (MULTIVITAMIN) LIQD Take 15 mLs by mouth daily.      [name] Kitchen olmesartan (BENICAR) 40 MG tablet Take 40 mg by mouth daily.      [name] Kitchen omeprazole (PRILOSEC) 20 MG capsule Take 20 mg by mouth daily.        . Potassium (POTASSIMIN) 75 MG TABS Take 1 tablet by mouth daily. 1 tab po qd      . pravastatin (PRAVACHOL) 20 MG tablet Take 20 mg by mouth daily.      . psyllium  (METAMUCIL) 58.6 % powder Take 1 packet by mouth daily.      [name] Kitchen warfarin (COUMADIN) 2.5 MG tablet Take 1.25-2.5 mg by mouth daily. Takes 1 tablet(2.5mg ) on tues, thurs, sat, Sunday, and takes 1/2tablets(1.25mg ) on mon. Wed, and fri        Allergies  Allergen Reactions  . Biaxin (Clarithromycin) Nausea And Vomiting  . Flagyl (Metronidazole) Nausea And Vomiting  . Moxifloxacin     REACTION: weakness  . Pantoprazole Sodium     REACTION: rash  . Penicillins     REACTION: rash    Review of Systems negative except from HPI and PMH  Physical Exam BP 132/82  Pulse 74  Ht 5\' 7"  (1.702 m)  Wt 183 lb (83.008 kg)  BMI 28.66 kg/m2Well developed and nourished in no acute distress HENT normal Neck supple with JVP-flat Carotids brisk and full without bruits Clear Irregularly irregular rate and rhythm with controlled ventricular response, no murmurs or gallops Abd-soft with active BS without hepatomegaly No Clubbing cyanosis edema Skin-warm and dry A & Oriented  Grossly normal sensory and motor function      Assessment and  Plan

## 2012-04-11 NOTE — Assessment & Plan Note (Signed)
We spent 30 minutes discussing the stressful situation of the care of her sister. She is also quite frustrated with some of the healthcare that she is received. I've encouraged her to followup with her evaluation for hyponatremia. We'll be looking into finding female counselors for her.

## 2012-04-11 NOTE — Assessment & Plan Note (Signed)
Atrial fibrillation is permanent. She will continue on warfarin.

## 2012-04-14 ENCOUNTER — Other Ambulatory Visit: Payer: Self-pay | Admitting: Internal Medicine

## 2012-04-14 ENCOUNTER — Telehealth: Payer: Self-pay | Admitting: Internal Medicine

## 2012-04-14 NOTE — Telephone Encounter (Signed)
Per pt Dr. Graciela Husbands was suppose to getting some names and numbers for Stephanie Cortez to talk to someone for support regarding her assistance with her sister and things she is dealing with. Dr. Graciela Husbands was gonna get back to her.

## 2012-04-15 ENCOUNTER — Ambulatory Visit (INDEPENDENT_AMBULATORY_CARE_PROVIDER_SITE_OTHER): Payer: BC Managed Care – PPO | Admitting: Gastroenterology

## 2012-04-15 ENCOUNTER — Encounter: Payer: Self-pay | Admitting: Gastroenterology

## 2012-04-15 VITALS — BP 120/50 | HR 72 | Ht 67.0 in | Wt 185.5 lb

## 2012-04-15 DIAGNOSIS — K5730 Diverticulosis of large intestine without perforation or abscess without bleeding: Secondary | ICD-10-CM

## 2012-04-15 DIAGNOSIS — Z7901 Long term (current) use of anticoagulants: Secondary | ICD-10-CM | POA: Diagnosis not present

## 2012-04-15 DIAGNOSIS — K5901 Slow transit constipation: Secondary | ICD-10-CM | POA: Diagnosis not present

## 2012-04-15 DIAGNOSIS — R1032 Left lower quadrant pain: Secondary | ICD-10-CM

## 2012-04-15 MED ORDER — DICYCLOMINE HCL 10 MG PO CAPS: 10.0000 mg | ORAL_CAPSULE | Freq: Two times a day (BID) | ORAL | Status: DC

## 2012-04-15 MED ORDER — MESALAMINE 1.2 G PO TBEC: 2.4000 g | DELAYED_RELEASE_TABLET | Freq: Every day | ORAL | Status: DC

## 2012-04-15 NOTE — Progress Notes (Signed)
This is a very nice but complicated 78 year old Caucasian female on multiple medications including daily Coumadin. She has had 2 years of intermittent left lower quadrant pain with mild constipation as per her previous clinic notes. Colonoscopy and CT scan have shown evidence of diverticulosis without diverticulitis. She continues to have gas, bloating, crampy lower nominal pain, and continues to have concerns about H. pylori infection, and has a sister who is dying of pancreatic cancer. She is under a large amount of stress caring for her family. She denies rectal bleeding, fever, chills, anorexia, weight loss. Patient does take daily Metamucil and has a history of hyponatremia from excessive water use. Her sodium levels are followed by primary care. Additionally she is on inhalers for asthmatic bronchitis, Ativan for anxiety,, Prilosec 20 mg a day for GERD, Pravachol, and Coumadin for atrial fibrillation.  Current Medications, Allergies, Past Medical History, Past Surgical History, Family History and Social History were reviewed in [employer] record.  Pertinent Review of Systems Negative... no current symptoms of congestive heart failure or angina. Her previous dysphagia has resolved. Patient is on the care of Dr. Ovidio Kin, ENT, and receives periodic Botox injections to her cricopharyngeal area.   Physical Exam: Healthy-appearing patient in no distress appears stated age. Blood pressure 120/50, pulse 70 and regular, and weight 185 pounds with a BMI of 29.05. I cannot appreciate stigmata of chronic liver disease. Her abdomen shows no distention, organomegaly, masses, and some slight tenderness to deep palpation of her sigmoid colon. Bowel sounds are normal. Mental status is normal.    Assessment and Plan: Symptomatic diverticulosis complicated by problems with her by mouth fluid intake. Her current symptoms suggest possible segmental colitis associated with severe diverticular  disease. I will give her a trial of Lialda 2.4 g a day, change her to Bentyl 10 mg twice a day, continue fiber supplements, liberal by mouth fluids as tolerated per her serum sodium levels, she we have showed her our video on diverticulosis and is management, have discontinued Levsin, and we'll see her back in one month's time for followup. She may need followup flexible sigmoidoscopy exam. No diagnosis found. ....[name] Kitchen

## 2012-04-15 NOTE — Telephone Encounter (Signed)
Notified pt of counselor per Dr. Graciela Husbands.

## 2012-04-15 NOTE — Patient Instructions (Addendum)
Please stop your Levsin. We have sent the following medications to your pharmacy for you to pick up at your convenience:  Bentyl and Lialda Please start Metamucil every morning and Miralax every night before bed.  You have watched the Movie on Diverticulosis. Please follow up with Dr Jarold Motto in 1 month.

## 2012-04-25 ENCOUNTER — Ambulatory Visit (INDEPENDENT_AMBULATORY_CARE_PROVIDER_SITE_OTHER): Payer: BC Managed Care – PPO | Admitting: *Deleted

## 2012-04-25 DIAGNOSIS — I4891 Unspecified atrial fibrillation: Secondary | ICD-10-CM | POA: Diagnosis not present

## 2012-04-25 LAB — POCT INR: INR: 2.4

## 2012-04-28 ENCOUNTER — Other Ambulatory Visit: Payer: Self-pay | Admitting: Family Medicine

## 2012-04-28 DIAGNOSIS — Z1231 Encounter for screening mammogram for malignant neoplasm of breast: Secondary | ICD-10-CM

## 2012-04-29 ENCOUNTER — Ambulatory Visit (INDEPENDENT_AMBULATORY_CARE_PROVIDER_SITE_OTHER): Payer: BC Managed Care – PPO | Admitting: Internal Medicine

## 2012-04-29 ENCOUNTER — Other Ambulatory Visit (INDEPENDENT_AMBULATORY_CARE_PROVIDER_SITE_OTHER): Payer: BC Managed Care – PPO

## 2012-04-29 ENCOUNTER — Encounter: Payer: Self-pay | Admitting: Internal Medicine

## 2012-04-29 VITALS — BP 122/70 | HR 84 | Temp 97.8°F | Ht 67.0 in | Wt 183.6 lb

## 2012-04-29 DIAGNOSIS — Z789 Other specified health status: Secondary | ICD-10-CM | POA: Diagnosis not present

## 2012-04-29 DIAGNOSIS — J209 Acute bronchitis, unspecified: Secondary | ICD-10-CM | POA: Diagnosis not present

## 2012-04-29 DIAGNOSIS — IMO0001 Reserved for inherently not codable concepts without codable children: Secondary | ICD-10-CM

## 2012-04-29 LAB — BASIC METABOLIC PANEL
BUN: 11 mg/dL (ref 6–23)
CO2: 26 mEq/L (ref 19–32)
Calcium: 9.5 mg/dL (ref 8.4–10.5)
Chloride: 101 mEq/L (ref 96–112)
Creatinine, Ser: 0.7 mg/dL (ref 0.4–1.2)
GFR: 90.77 mL/min (ref >60.00)
Glucose, Bld: 86 mg/dL (ref 70–99)
Potassium: 4.4 mEq/L (ref 3.5–5.1)
Sodium: 134 mEq/L — ABNORMAL LOW (ref 135–145)

## 2012-04-29 MED ORDER — DOXYCYCLINE HYCLATE 100 MG PO TABS: 100.0000 mg | ORAL_TABLET | Freq: Two times a day (BID) | ORAL | Status: DC

## 2012-04-29 MED ORDER — PREDNISONE 10 MG PO TABS: ORAL_TABLET | ORAL | Status: DC

## 2012-04-29 NOTE — Progress Notes (Signed)
Subjective:    Patient ID: Stephanie Cortez, female    DOB: 11/26/1933, 78 y.o.   MRN: [id number]  HPI 78 yo WF with known hx of Bronchietasis  Follouwp Bronchiectasis. Last ollowup Bronchiectasis. Last seen August 2010, Dec 2010, Jan 2011, FEb 2011 (AE bronciectasis), and  Oct 2011. S/p pulmonary rehab in summer 2011.  Since then had one more visit in Nov 2011 for  AE bronchiectasis. Since then doing well. Denies cough or dyspnea but feels chest is just a bit more tight than usual. . No other specific complaints.  Denies chest pain, dyspnea, orthopnea, hemoptysis, fever, n/v/d, edema, headache.  However, does admit to increased fatigue  + . Uses symbicort as needed which is around 2-3 times per week. Is requesting spirometry  >>rx  symbicort daily    OV 04/12/11:  Using symbicort prn and overall doing well. Only has minimal wheeze. She is anticipatory and pro-acitve in care. When sinus congestion develops uses netti pot and lemon-honey vapors for relief. This prevents wheezing. Typically winter is when she has AE bronchiectasis. Wants to know if she should take prn symbicort or go on daily.  >>no changes   05/23/2011 Acute OV  Complains of head congestion, clear mucus production, PND, chest congestion, cough x1week .  Taking mucinex with some help. Mucus is all clear. No fever or discolored mucus.  Worried that she will be sick over the holidays and she is leaving to go out of town.  She is using netti pot with good relief. She has increased her symbicort Twice daily  With decreased cough.  >>rx doxycycline  11/29 /2012 Follow up  Pt returns for follow up . She is feeling is better. Currently  taking doxycycline.  reports breathing has improved but still having sinuscongestion and drainage. Last ov she was given Augmentin -unfortunately has hx of PCN allergy and abx was changed prior to taking. She is tolerating abx well. Congestion and cough are much better.  No fever or chest pain.  >>No  changes   06/19/2011 Acute OV Complains of wheezing, head congestion with green drainage, hacky cough x3-4days. OTC not helping. Using Netti pot rinses .  No hemotpysis . Worse in am and late at night.  Cough is getting  Worse. Does not want to be sick for the holidays-has too much to do.  Under a lot of stress with sister that is ill. Traveling back and forth to [place].     Past Med: has changed PMD to Dr Laury Axon.  Social: taking care of elder sister in her 9s who has now become dependent Fam hx: as in soccial. No other change   REC Continue on Symbicort 2 puffs Twice daily Until back to your baseline  Mucinex Twice daily As needed Cough/congestion  Fluids and rest  I have given you a prescription for antibiotic--Zpack To have on hold in case  You developed discolored mucus that is not improving.  Saline nasal rinses As needed  Please contact office for sooner follow up if symptoms do not improve or worsen or seek emergency care  follow up Dr. Marchelle Gearing as planned and As needed   OV 08/01/2011  D 23 of 28 biaxin. Blocked sinus. Sinus pressure. Hx of eustachian tube grommets. No resp issues. Netti pot not working - too blocked. Denies fever, sputum, resp exacerbation, wheeze, edema, orthopnea, paroxysmal nocturnal dyspnea    Currently lungs appear ok  Our coordinator will find out who saaw you in [facility]. We will  try to have you seen this week  REturn in 3 months or sooner if needed  Please continue with your medications  OV 10/30/2011  Sinus drainage copious - intolerant to netti pot  - burns, does not want nasal steroids or anti histamine. Wants to go back to ENT. This is making tickle in throat that is severe x 1 week with mucus stuck in throat and gag. CT abdomen lung cut April 2013 for abdominal pain shows clear lung fields with some basal atelectasis. Do not see any basal bronchiectasis  Past, Family, Social reviewed: gi issues, gi scope pending  Currently lungs appear  ok  Our nurse will have you see Dr in [facility] asap for sinus Issues  Take care of your gi issues  REturn in 6 months or sooner if needed  Please continue with your medications  #Overweight  - Victorino Dike will show you the [facility] lipid diet sheet and how to use it  #Followup  - 6 - 9 months or sooner if needed  - spirometry at followup   OV 04/29/2012  Overall ok. Younger sister in [place] died. She is sad. She is having  1 week of increased cough with chest tightness but no sputum or fever. Is asociated with scratchy throat. She feels antibiptics indicated because she wants to make it to funeral. She wants prednisone on standby. No other new issues. Has seen ENT  Review of Systems  Constitutional: Negative for fever and unexpected weight change.  HENT: Negative for ear pain, nosebleeds, congestion, sore throat, rhinorrhea, sneezing, trouble swallowing, dental problem, postnasal drip and sinus pressure.   Eyes: Negative for redness and itching.  Respiratory: Negative for cough, chest tightness, shortness of breath and wheezing.   Cardiovascular: Negative for palpitations and leg swelling.  Gastrointestinal: Negative for nausea and vomiting.  Genitourinary: Negative for dysuria.  Musculoskeletal: Negative for joint swelling.  Skin: Negative for rash.  Neurological: Negative for headaches.  Hematological: Does not bruise/bleed easily.  Psychiatric/Behavioral: Negative for dysphoric mood. The patient is not nervous/anxious.        Objective:   Physical Exam GEN: A/Ox3; pleasant , NAD, well nourished   HEENT:  [place]/AT,  EACs-clear, TMs-wnl, NOSE-clear mucus,, THROAT-clear, no lesions, no postnasal drip or exudate noted.    NECK:  Supple w/ fair ROM; no JVD; normal carotid impulses w/o bruits; no thyromegaly or nodules palpated; no lymphadenopathy.  RESP  Coarse BS ; w/o, wheezes/ rales/ or rhonchi.no accessory muscle use, no dullness to percussion  CARD:  RRR, no m/r/g  , no peripheral edema,  pulses intact, no cyanosis or clubbing.  GI:   Soft & nt; nml bowel sounds; no organomegaly or masses detected.  Musco: Warm bil, no deformities or joint swelling noted.   Neuro: alert, no focal deficits noted.    Skin: Warm, no lesions or rashes        Assessment & Plan:

## 2012-04-29 NOTE — Patient Instructions (Addendum)
Glad you had flu shot Sorry to hear about your sisters' passing away For current bronchitis  - Take doxycycline 100mg po twice daily x 5 days; take after meals and avoid sunlight   - Take a script for  prednisone 40 mg daily x 2 days, then 20mg daily x 2 days, then 10mg daily x 2 days, then 5mg daily x 2 days and stop    - do not take prednisone unless you are feeling worse REturn in 9 months or sooner if needed   

## 2012-05-01 NOTE — Assessment & Plan Note (Signed)
Glad you had flu shot Sorry to hear about your sisters' passing away For current bronchitis  - Take doxycycline 100mg  po twice daily x 5 days; take after meals and avoid sunlight   - Take a script for  prednisone 40 mg daily x 2 days, then 20mg  daily x 2 days, then 10mg  daily x 2 days, then 5mg  daily x 2 days and stop    - do not take prednisone unless you are feeling worse REturn in 9 months or sooner if needed

## 2012-05-07 ENCOUNTER — Telehealth: Payer: Self-pay | Admitting: Family Medicine

## 2012-05-07 MED ORDER — OLMESARTAN MEDOXOMIL 40 MG PO TABS: 40.0000 mg | ORAL_TABLET | Freq: Every day | ORAL | Status: DC

## 2012-05-07 NOTE — Telephone Encounter (Signed)
Early refill request from [facility] for: Benicar 40 mg tablet. Tk 1 T PO D. Qty 10.30.13

## 2012-05-13 ENCOUNTER — Telehealth: Payer: Self-pay

## 2012-05-13 ENCOUNTER — Other Ambulatory Visit: Payer: Self-pay | Admitting: Family Medicine

## 2012-05-13 DIAGNOSIS — E871 Hypo-osmolality and hyponatremia: Secondary | ICD-10-CM

## 2012-05-13 NOTE — Telephone Encounter (Signed)
Last check was much better--essentially normal but order put in for recheck

## 2012-05-13 NOTE — Telephone Encounter (Signed)
Pt states last night had dizziness, heart started racing, was light headed all this maked pt weak and she had to lay down. Pt states took extra Benicar, and ate salt and drunk Gatorade. Pt asking for a sodium pill and will be making an appt to discuss sodium level and resolutions. (Pt states has an Upper resp problem and takes meds) pt requesting an appt for lab check sodium and a f/u appt with Lowne to go over labs and discuss resolution for low sodium levels    PLz advise   MW

## 2012-05-13 NOTE — Telephone Encounter (Signed)
Please advise      KP 

## 2012-05-13 NOTE — Telephone Encounter (Signed)
Patient has been scheduled for labs on 11.14 and follow up with [facility] 11.18--need to put in lab orders please

## 2012-05-15 ENCOUNTER — Telehealth: Payer: Self-pay | Admitting: *Deleted

## 2012-05-15 ENCOUNTER — Other Ambulatory Visit (INDEPENDENT_AMBULATORY_CARE_PROVIDER_SITE_OTHER): Payer: BC Managed Care – PPO

## 2012-05-15 DIAGNOSIS — E871 Hypo-osmolality and hyponatremia: Secondary | ICD-10-CM

## 2012-05-15 LAB — BASIC METABOLIC PANEL
BUN: 11 mg/dL (ref 6–23)
CO2: 23 mEq/L (ref 19–32)
Calcium: 9.7 mg/dL (ref 8.4–10.5)
Chloride: 97 mEq/L (ref 96–112)
Creatinine, Ser: 0.7 mg/dL (ref 0.4–1.2)
GFR: 92.35 mL/min (ref >60.00)
Glucose, Bld: 125 mg/dL — ABNORMAL HIGH (ref 70–99)
Potassium: 4.2 mEq/L (ref 3.5–5.1)
Sodium: 129 mEq/L — ABNORMAL LOW (ref 135–145)

## 2012-05-15 NOTE — Telephone Encounter (Signed)
Pt calls to tell us that she is presently on Prednisone taper dose that she started on Tuesday for 8 days. She also took Doxycycline for 5 days started on 04/30/12. Will check INR tomorrow.

## 2012-05-16 ENCOUNTER — Ambulatory Visit (INDEPENDENT_AMBULATORY_CARE_PROVIDER_SITE_OTHER): Payer: BC Managed Care – PPO | Admitting: *Deleted

## 2012-05-16 ENCOUNTER — Ambulatory Visit: Payer: BC Managed Care – PPO | Admitting: Gastroenterology

## 2012-05-16 DIAGNOSIS — I4891 Unspecified atrial fibrillation: Secondary | ICD-10-CM

## 2012-05-16 LAB — POCT INR: INR: 2.1

## 2012-05-19 ENCOUNTER — Ambulatory Visit (INDEPENDENT_AMBULATORY_CARE_PROVIDER_SITE_OTHER): Payer: BC Managed Care – PPO | Admitting: Family Medicine

## 2012-05-19 ENCOUNTER — Encounter: Payer: Self-pay | Admitting: Family Medicine

## 2012-05-19 VITALS — BP 132/80 | HR 96 | Temp 98.4°F | Wt 182.2 lb

## 2012-05-19 DIAGNOSIS — E039 Hypothyroidism, unspecified: Secondary | ICD-10-CM | POA: Diagnosis not present

## 2012-05-19 DIAGNOSIS — E871 Hypo-osmolality and hyponatremia: Secondary | ICD-10-CM

## 2012-05-19 LAB — BASIC METABOLIC PANEL
BUN: 13 mg/dL (ref 6–23)
CO2: 24 mEq/L (ref 19–32)
Calcium: 9.8 mg/dL (ref 8.4–10.5)
Chloride: 100 mEq/L (ref 96–112)
Creatinine, Ser: 0.7 mg/dL (ref 0.4–1.2)
GFR: 82.21 mL/min (ref >60.00)
Glucose, Bld: 78 mg/dL (ref 70–99)
Potassium: 3.8 mEq/L (ref 3.5–5.1)
Sodium: 133 mEq/L — ABNORMAL LOW (ref 135–145)

## 2012-05-19 LAB — T3, FREE: T3, Free: 2.1 pg/mL — ABNORMAL LOW (ref 2.3–4.2)

## 2012-05-19 LAB — T4, FREE: Free T4: 1.08 ng/dL (ref 0.60–1.60)

## 2012-05-19 LAB — TSH: TSH: 0.67 u[IU]/mL (ref 0.35–5.50)

## 2012-05-19 NOTE — Patient Instructions (Addendum)
Hyponatremia   Hyponatremia is when the amount of salt (sodium) in your blood is too low. When sodium levels are low, your cells will absorb extra water and swell. The swelling happens throughout the body, but it mostly affects the brain. Severe brain swelling (cerebral edema), seizures, or coma can happen.   CAUSES    Heart, kidney, or liver problems.   Thyroid problems.   Adrenal gland problems.   Severe vomiting and diarrhea.   Certain medicines or illegal drugs.   Dehydration.   Drinking too much water.   Low-sodium diet.  SYMPTOMS    Nausea and vomiting.   Confusion.   Lethargy.   Agitation.   Headache.   Twitching or shaking (seizures).   Unconsciousness.   Appetite loss.   Muscle weakness and cramping.  DIAGNOSIS   Hyponatremia is identified by a simple blood test. Your caregiver will perform a history and physical exam to try to find the cause and type of hyponatremia. Other tests may be needed to measure the amount of sodium in your blood and urine.  TREATMENT   Treatment will depend on the cause.    Fluids may be given through the vein (IV).   Medicines may be used to correct the sodium imbalance. If medicines are causing the problem, they will need to be adjusted.   Water or fluid intake may be restricted to restore proper balance.  The speed of correcting the sodium problem is very important. If the problem is corrected too fast, nerve damage (sometimes unchangeable) can happen.  HOME CARE INSTRUCTIONS    Only take medicines as directed by your caregiver. Many medicines can make hyponatremia worse. Discuss all your medicines with your caregiver.   Carefully follow any recommended diet, including any fluid restrictions.   You may be asked to repeat lab tests. Follow these directions.   Avoid alcohol and recreational drugs.  SEEK MEDICAL CARE IF:    You develop worsening nausea, fatigue, headache, confusion, or weakness.   Your original hyponatremia symptoms return.   You have  problems following the recommended diet.  SEEK IMMEDIATE MEDICAL CARE IF:    You have a seizure.   You faint.   You have ongoing diarrhea or vomiting.  MAKE SURE YOU:    Understand these instructions.   Will watch your condition.   Will get help right away if you are not doing well or get worse.  Document Released: 06/08/2002 Document Revised: 09/10/2011 Document Reviewed: 12/03/2010  Exit[employer] Patient Information 2013 [employer].

## 2012-05-19 NOTE — Progress Notes (Signed)
  Subjective:    Patient ID: Stephanie Cortez, female    DOB: 1933/10/04, 78 y.o.   MRN: [id number]  HPI Pt here to discuss low sodium.   Pt is concerned about it.  She has been a little light headed but otherwise no other symptoms.    See MRI and labs.   Pt has been adding salt to food and denies drinking too much water.   No ETOH.     Review of Systems as above   Objective:   Physical Exam  Constitutional: She is oriented to person, place, and time. She appears well-developed and well-nourished.  Neurological: She is alert and oriented to person, place, and time.  Psychiatric: She has a normal mood and affect. Her behavior is normal. Judgment and thought content normal.          Assessment & Plan:

## 2012-05-19 NOTE — Assessment & Plan Note (Signed)
Check thyroid function Recheck bmp Refer to endo

## 2012-05-20 ENCOUNTER — Ambulatory Visit: Payer: BC Managed Care – PPO | Admitting: Gastroenterology

## 2012-05-21 ENCOUNTER — Other Ambulatory Visit: Payer: Self-pay | Admitting: Family Medicine

## 2012-05-21 NOTE — Telephone Encounter (Signed)
OV 05/19/12. Looks like historical med no dispense quantity info. PLz advise     MW

## 2012-05-21 NOTE — Telephone Encounter (Signed)
OV 05/19/12. Ativan last filled 04/03/12 no other info I could see. PLz advise    MW

## 2012-05-22 ENCOUNTER — Ambulatory Visit (INDEPENDENT_AMBULATORY_CARE_PROVIDER_SITE_OTHER): Payer: BC Managed Care – PPO | Admitting: Gastroenterology

## 2012-05-22 ENCOUNTER — Other Ambulatory Visit: Payer: Self-pay | Admitting: Family Medicine

## 2012-05-22 ENCOUNTER — Encounter: Payer: Self-pay | Admitting: Gastroenterology

## 2012-05-22 VITALS — BP 132/72 | HR 74 | Ht 65.75 in | Wt 185.2 lb

## 2012-05-22 DIAGNOSIS — T887XXA Unspecified adverse effect of drug or medicament, initial encounter: Secondary | ICD-10-CM

## 2012-05-22 DIAGNOSIS — T50905A Adverse effect of unspecified drugs, medicaments and biological substances, initial encounter: Secondary | ICD-10-CM

## 2012-05-22 DIAGNOSIS — K5010 Crohn's disease of large intestine without complications: Secondary | ICD-10-CM

## 2012-05-22 DIAGNOSIS — K5730 Diverticulosis of large intestine without perforation or abscess without bleeding: Secondary | ICD-10-CM | POA: Diagnosis not present

## 2012-05-22 NOTE — Patient Instructions (Addendum)
Please stop Bentyl  Please follow up with Dr. Jarold Motto in one year   Please continue your Lialda as you been taken it

## 2012-05-22 NOTE — Progress Notes (Signed)
History of Present Illness: This is a 78 year old Caucasian female with severe diverticulosis and segmental colitis.  She is currently much improved on Lialda 2.4 g a day.  She also has been using Bentyl 10 mg twice a day which has caused some dizziness, dry mouth, and possibly some blurred vision.  She is with her daughter today discussed side effects of her medications causing these complications.  Her gastrointestinal complaints are markedly improved, and she denies abdominal pain or bowel or regularity.  She is on multiple medications listed and reviewed.  These include Antivert 25 mg 3 times a day for dizziness which I did not prescribe.  She is followed by primary care, and has had some mild hyponatremic, and is being evaluated for thyroid dysfunction.  Blood pressure today 132/72, pulse 74 and regular, and weight 185 pounds.  Resting oxygen saturation 98%.  General physical exam was not performed.    Current Medications, Allergies, Past Medical History, Past Surgical History, Family History and Social History were reviewed in [employer] record.   Assessment and plan: Side effects from anticholinergic medications prescribed for colon spasm.  I have discontinued her Bentyl, but eversion to continue Asacol 1.2 g a day for several more weeks per her segmental colitis.  She also is much improved on daily Metamucil and high-fiber diet.  We will see her on when necessary basis as needed. No diagnosis found.

## 2012-05-23 NOTE — Telephone Encounter (Signed)
Refused refill for Ativan just filled 05/21/12 # 90.     MW

## 2012-05-29 ENCOUNTER — Other Ambulatory Visit: Payer: Self-pay | Admitting: Family Medicine

## 2012-06-03 ENCOUNTER — Ambulatory Visit
Admission: RE | Admit: 2012-06-03 | Discharge: 2012-06-03 | Disposition: A | Discharge status: Home / Self Care | Payer: BC Managed Care – PPO | Source: Ambulatory Visit | Attending: Family Medicine | Admitting: Family Medicine

## 2012-06-03 ENCOUNTER — Telehealth: Payer: Self-pay | Admitting: Internal Medicine

## 2012-06-03 DIAGNOSIS — Z1231 Encounter for screening mammogram for malignant neoplasm of breast: Secondary | ICD-10-CM

## 2012-06-03 NOTE — Telephone Encounter (Signed)
[email or web address]  

## 2012-06-04 ENCOUNTER — Ambulatory Visit (INDEPENDENT_AMBULATORY_CARE_PROVIDER_SITE_OTHER): Payer: BC Managed Care – PPO | Admitting: Endocrinology

## 2012-06-04 ENCOUNTER — Encounter: Payer: Self-pay | Admitting: Endocrinology

## 2012-06-04 VITALS — BP 122/70 | HR 77 | Temp 97.6°F | Wt 193.0 lb

## 2012-06-04 DIAGNOSIS — E871 Hypo-osmolality and hyponatremia: Secondary | ICD-10-CM | POA: Diagnosis not present

## 2012-06-04 DIAGNOSIS — E039 Hypothyroidism, unspecified: Secondary | ICD-10-CM

## 2012-06-04 LAB — CORTISOL: Cortisol, Plasma: 12.2 ug/dL

## 2012-06-04 MED ORDER — COSYNTROPIN 0.25 MG IJ SOLR
0.2500 mg | Freq: Once | INTRAMUSCULAR | Status: AC
  Administered 2012-06-04: 0.25 mg via INTRAVENOUS

## 2012-06-04 MED ORDER — DEMECLOCYCLINE HCL 150 MG PO TABS: 150.0000 mg | ORAL_TABLET | Freq: Two times a day (BID) | ORAL | Status: DC

## 2012-06-04 MED ORDER — DOXYCYCLINE HYCLATE 100 MG PO TABS: 100.0000 mg | ORAL_TABLET | Freq: Two times a day (BID) | ORAL | Status: DC

## 2012-06-04 NOTE — Telephone Encounter (Signed)
Spoke with pt. She is c/o sinus pressure/congestion and increased cough x 2 days. She has green sputum after uses inhalers and green nasal d/c after uses netti pot. She has had slight increased SOB. No fever, wheezing, CP. Would like rx for doxy. She is going to visit sister out of town this wkend and needs to be well. Please advise, thanks! Allergies  Allergen Reactions  . Biaxin (Clarithromycin) Nausea And Vomiting  . Flagyl (Metronidazole) Nausea And Vomiting  . Moxifloxacin     REACTION: weakness  . Pantoprazole Sodium     REACTION: rash  . Penicillins     REACTION: rash

## 2012-06-04 NOTE — Telephone Encounter (Signed)
LMTCB

## 2012-06-04 NOTE — Telephone Encounter (Signed)
Patient returning call.

## 2012-06-04 NOTE — Patient Instructions (Addendum)
blood tests are being requested for you today.  We'll contact you with results. i have sent a prescription to your pharmacy, for a medication to raise the salt level. Please come back for a follow-up appointment for 1 month.   Please have your coumadin level rechecked within the next few days.   (update: the lab will be contacted, to check on the status of the second cortisol level)

## 2012-06-04 NOTE — Telephone Encounter (Signed)
Dr Marchelle Gearing was paged by Verlon Au.  MR returned page; read the message to him as typed by Verlon Au.  Per MR: okay for doxycycline 100mg  #14  1 po bid after meals; avoid sunlight.  If she develops wheezing, ask her to call back for a prednisone taper.  Called spoke with patient, advised of MR's recs as stated above.  Pt verbalized her understanding and is aware to call back if needed for new symptoms.  Rx sent to verified pharmacy.  Will sign off.

## 2012-06-04 NOTE — Progress Notes (Signed)
Subjective:    Patient ID: Stephanie Cortez, female    DOB: 1933-12-19, 78 y.o.   MRN: [id number]  HPI Pt states few years of hyponatremia.  She has never been on rx for this.  She now reports moderate weakness of all 4 extremities, and assoc lightheadedness.  She finished a 10-day course of prednisone, approx 10 days ago.  She was seen in ER for hyponatremia, 2 mos ago.   Past Medical History  Diagnosis Date  . Bronchiectasis     >PFT 07/13/2008 in [place]  Fev 1.9L/76%, FVC 2.45L/74, Ratio 79, TLC 121%, DLCO 64%  AE BRonchiectasis - Dec 2010.New Rx:  outpatient - Feb 2011 - Rx outpatient  . Hypothyroidism   . Dyslipidemia   . Eczema   . Anemia     - Hgb 9.7gm% on 07/13/2008 in [place] -  Hgg 129gm% wiht normal irone levsl and ferritin 10/27/2008 in [place] Recurrent otitis/sinusitis  . Atrial fibrillation   . Glaucoma(365)   . Back pain 2009  . Anxiety   . Diverticulosis 11/14/2011  . COPD (chronic obstructive pulmonary disease)     never smoked  . Shortness of breath   . Hypertension   . GERD (gastroesophageal reflux disease)   . Arthritis   . IBS (irritable bowel syndrome) 11/14/2011  . Esophageal stenosis 11/14/2011  . Hemorrhoids   . H. pylori infection 10/08/2011     Past Surgical History  Procedure Date  . Cataract extraction     both  . Foot surgery 03/14/2011    gastroc slide-rt  . Total abdominal hysterectomy   . Colonoscopy   . Breast surgery     br bx  . Esophagoscopy w/ botox injection 12/11/2011    Procedure: ESOPHAGOSCOPY WITH BOTOX INJECTION;  Surgeon: Drema Halon, MD;  Location: [facility];  Service: ENT;  Laterality: N/A;  esophogoscopy with dilation, botox injection    History   Social History  . Marital Status: Married    Spouse Name: N/A    Number of Children: 2  . Years of Education: N/A   Occupational History  . retired Runner, broadcasting/film/video    Social History Main Topics  . Smoking status: Never Smoker   . Smokeless tobacco: Never Used  .  Alcohol Use: No  . Drug Use: No  . Sexually Active: Not on file   Other Topics Concern  . Not on file   Social History Narrative   2 children 4 grandchildrenRecently moved to [place] from [place] TeacherGrew up in [place]. Moved to [place]. Moved to [place] Feb 2010 due to duaghther living in [place]. Son lives in [place], [place]. Husband works as Production designer, theatre/television/film in [place].    Current Outpatient Prescriptions on File Prior to Visit  Medication Sig Dispense Refill  . BENICAR 40 MG tablet TAKE 1 TABLET BY MOUTH DAILY  30 tablet  5  . budesonide-formoterol (SYMBICORT) 80-4.5 MCG/ACT inhaler Inhale 2 puffs into the lungs 2 (two) times daily.      [name] Kitchen diltiazem (TIAZAC) 300 MG 24 hr capsule Take 300 mg by mouth daily.      . fluticasone (FLONASE) 50 MCG/ACT nasal spray Place 2 sprays into the nose daily.      [name] Kitchen levothyroxine (SYNTHROID, LEVOTHROID) 50 MCG tablet Take 1 tablet (50 mcg total) by mouth daily.  90 tablet  1  . LORazepam (ATIVAN) 0.5 MG tablet TAKE 1 TABLET BY MOUTH EVERY 8 HOURS  90 tablet  0  .  MAGNESIUM PO With Calcium, Takes 2 tbsp by mouth daily      . meclizine (ANTIVERT) 25 MG tablet Take 25 mg by mouth 3 (three) times daily as needed. vertigo      . mesalamine (LIALDA) 1.2 G EC tablet Take 2 tablets (2.4 g total) by mouth daily with breakfast.  60 tablet  3  . Multiple Vitamin (MULTIVITAMIN) LIQD Take 15 mLs by mouth daily.      [name] Kitchen olmesartan (BENICAR) 40 MG tablet Take 1 tablet (40 mg total) by mouth daily.  30 tablet  5  . omeprazole (PRILOSEC) 20 MG capsule Take 20 mg by mouth daily.        . Potassium (POTASSIMIN) 75 MG TABS Take 1 tablet by mouth daily. 1 tab po qd      . pravastatin (PRAVACHOL) 20 MG tablet Take 20 mg by mouth daily.      . predniSONE (DELTASONE) 10 MG tablet Take 4 tabs po x 2 days, then 2 x 2 days, then 1 x 2 days, then 1/2 x 2 days then STOP  20 tablet  0  . psyllium (METAMUCIL) 58.6 % powder Take 1 packet by mouth daily.       [name] Kitchen warfarin (COUMADIN) 2.5 MG tablet Take 1.25-2.5 mg by mouth daily. Takes 1 tablet(2.5mg ) on tues, thurs, sat, Sunday, and takes 1/2tablets(1.25mg ) on mon. Wed, and fri        Allergies  Allergen Reactions  . Biaxin (Clarithromycin) Nausea And Vomiting  . Flagyl (Metronidazole) Nausea And Vomiting  . Moxifloxacin     REACTION: weakness  . Pantoprazole Sodium     REACTION: rash  . Penicillins     REACTION: rash    Family History  Problem Relation Age of Onset  . Hypertension Mother   . Atrial fibrillation      siblings  . Lung cancer Brother   . Emphysema Brother   . Other Father     miner's lung  . Colon polyps Sister   . Pancreatic cancer Sister   . Kidney disease Sister   neg for hyponatremia.  BP 122/70  Pulse 77  Temp 97.6 F (36.4 C) (Oral)  Wt 193 lb (87.544 kg)  SpO2 99%  Review of Systems denies polyuria, menopausal sxs, rash, depression, visual loss, LOC, galactorrhea, weight change, easy bruising, change in facial appearance, rhinorrhea, and n/v  she has slight headache. Objective:   Physical Exam VS: see vs page GEN: no distress HEAD: head: no deformity eyes: no periorbital swelling, no proptosis external nose and ears are normal mouth: no lesion seen NECK: supple, thyroid is not enlarged CHEST WALL: no deformity LUNGS:  Clear to auscultation CV: irreg rhythm, normal rate, no murmur ABD: abdomen is soft, nontender.  no hepatosplenomegaly.  not distended.  no hernia.   MUSCULOSKELETAL: muscle bulk and strength are grossly normal.  no obvious joint swelling.  gait is normal and steady EXTEMITIES: no deformity.  no ulcer on the feet.  feet are of normal color and temp.  no edema PULSES: dorsalis pedis intact bilat.  no carotid bruit NEURO:  cn 2-12 grossly intact.   readily moves all 4's.  sensation is intact to touch on the feet SKIN:  Normal texture and temperature.  No rash or suspicious lesion is visible.   NODES:  None palpable at the  neck PSYCH: alert, oriented x3.  Does not appear anxious nor depressed.    acth stimulation test is done: baseline cortisol level=12 then cosyntropin 250  mcg is given im 45 minutes later, cortisol level=(no result yet) (normal response)  Lab Results  Component Value Date   TSH 0.67 05/19/2012       Assessment & Plan:  Hyponatremia, uncertain etiology AF.  In this setting, she should have normalization of electrolytes Generalized weakness: this could be due to hyponatremia

## 2012-06-08 ENCOUNTER — Telehealth: Payer: Self-pay | Admitting: Endocrinology

## 2012-06-08 NOTE — Telephone Encounter (Signed)
Please call lab.  We need the second cortisol level from a few days ago.

## 2012-06-11 NOTE — Telephone Encounter (Signed)
LEFT MESSAGE ON CELL AND;HOME # OF NEED TO CALL OFFICE CONCERNING LAB TEST THAT NEEDS TO BE DONE OR IF SHE HAD DONE. LAB CORISOL LEVES

## 2012-06-13 ENCOUNTER — Ambulatory Visit (INDEPENDENT_AMBULATORY_CARE_PROVIDER_SITE_OTHER): Payer: BC Managed Care – PPO | Admitting: *Deleted

## 2012-06-13 DIAGNOSIS — I4891 Unspecified atrial fibrillation: Secondary | ICD-10-CM

## 2012-06-13 LAB — POCT INR: INR: 2.5

## 2012-06-13 NOTE — Telephone Encounter (Signed)
The 2 antibiotics are different--take both Did you do the 2nd blood draw when you were here?

## 2012-06-13 NOTE — Telephone Encounter (Signed)
Spoke with patient this AM. States she has been out of town couple days. She stated concerning cortisol level she had seen on My Chart that level was 12. And was to call today to see what that meant. Stated that before her trip she had called Dr. Sandria Manly office to get medication for to take for cough and congestion while out of town. Was called in Doxycycline and started this. Then she realized this was simialr to her medication Dr. Everardo All had called in for her to take from her visit with you on 06/04/2012 after 2 days of taking Demeclomycin she stopped this. Was not aware of any more lab work to be done but will do what Dr. Everardo All suggest for lab work. She is aware of getting her coumadin checked today. Please advise?

## 2012-06-13 NOTE — Telephone Encounter (Signed)
If she did not do 2nd blood draw, please return here in January.  We'll redo the test

## 2012-06-13 NOTE — Telephone Encounter (Signed)
PATIENT NOTIFIED OF BLOOD TEST CORTISOL TO BE DONE IN January VISIT ON 07/07/2012 AND TO CONTINUE ON ANTIBIOTIC AS DR. ELLISON ORDERED.

## 2012-06-14 ENCOUNTER — Other Ambulatory Visit: Payer: Self-pay | Admitting: Internal Medicine

## 2012-06-18 DIAGNOSIS — H6980 Other specified disorders of Eustachian tube, unspecified ear: Secondary | ICD-10-CM | POA: Diagnosis not present

## 2012-06-18 DIAGNOSIS — H8190 Unspecified disorder of vestibular function, unspecified ear: Secondary | ICD-10-CM | POA: Diagnosis not present

## 2012-06-18 DIAGNOSIS — H9209 Otalgia, unspecified ear: Secondary | ICD-10-CM | POA: Diagnosis not present

## 2012-06-26 ENCOUNTER — Other Ambulatory Visit: Payer: Self-pay | Admitting: Family Medicine

## 2012-06-26 NOTE — Telephone Encounter (Signed)
Refill done.  

## 2012-07-07 ENCOUNTER — Ambulatory Visit (INDEPENDENT_AMBULATORY_CARE_PROVIDER_SITE_OTHER): Payer: BC Managed Care – PPO | Admitting: Endocrinology

## 2012-07-07 ENCOUNTER — Encounter: Payer: Self-pay | Admitting: Endocrinology

## 2012-07-07 VITALS — BP 130/78 | HR 87 | Temp 98.6°F | Wt 185.0 lb

## 2012-07-07 DIAGNOSIS — E871 Hypo-osmolality and hyponatremia: Secondary | ICD-10-CM | POA: Diagnosis not present

## 2012-07-07 LAB — CORTISOL
Cortisol, Plasma: 26.8 ug/dL
Cortisol, Plasma: 7.8 ug/dL

## 2012-07-07 MED ORDER — COSYNTROPIN 0.25 MG IJ SOLR
0.2500 mg | Freq: Once | INTRAMUSCULAR | Status: AC
  Administered 2012-07-07: 0.25 mg via INTRAVENOUS

## 2012-07-07 NOTE — Progress Notes (Signed)
Subjective:    Patient ID: Stephanie Cortez, female    DOB: 09-29-1933, 79 y.o.   MRN: [id number]  HPI Pt states few years of hyponatremia.  She has never been on rx for this.  She now reports moderate weakness of all 4 extremities, and assoc lightheadedness.  She finished a 10-day course of prednisone, approx 10 days ago.  She was seen in ER for hyponatremia, 2 mos ago.   She also has nausea Past Medical History  Diagnosis Date  . Bronchiectasis     >PFT 07/13/2008 in [place]  Fev 1.9L/76%, FVC 2.45L/74, Ratio 79, TLC 121%, DLCO 64%  AE BRonchiectasis - Dec 2010.New Rx:  outpatient - Feb 2011 - Rx outpatient  . Hypothyroidism   . Dyslipidemia   . Eczema   . Anemia     - Hgb 9.7gm% on 07/13/2008 in [place] -  Hgg 129gm% wiht normal irone levsl and ferritin 10/27/2008 in [place] Recurrent otitis/sinusitis  . Atrial fibrillation   . Glaucoma(365)   . Back pain 2009  . Anxiety   . Diverticulosis 11/14/2011  . COPD (chronic obstructive pulmonary disease)     never smoked  . Shortness of breath   . Hypertension   . GERD (gastroesophageal reflux disease)   . Arthritis   . IBS (irritable bowel syndrome) 11/14/2011  . Esophageal stenosis 11/14/2011  . Hemorrhoids   . H. pylori infection 10/08/2011     Past Surgical History  Procedure Date  . Cataract extraction     both  . Foot surgery 03/14/2011    gastroc slide-rt  . Total abdominal hysterectomy   . Colonoscopy   . Breast surgery     br bx  . Esophagoscopy w/ botox injection 12/11/2011    Procedure: ESOPHAGOSCOPY WITH BOTOX INJECTION;  Surgeon: Drema Halon, MD;  Location: [facility];  Service: ENT;  Laterality: N/A;  esophogoscopy with dilation, botox injection    History   Social History  . Marital Status: Married    Spouse Name: N/A    Number of Children: 2  . Years of Education: N/A   Occupational History  . retired Runner, broadcasting/film/video    Social History Main Topics  . Smoking status: Never Smoker   . Smokeless  tobacco: Never Used  . Alcohol Use: No  . Drug Use: No  . Sexually Active: Not on file   Other Topics Concern  . Not on file   Social History Narrative   2 children 4 grandchildrenRecently moved to [place] from [employer] TeacherG[place] up in [place]. Moved to [place]. Moved to [place] Feb 2010 due to duaghther living in [place]. Son lives in [place], [place]. Husband works as Production designer, theatre/television/film in [place].    Current Outpatient Prescriptions on File Prior to Visit  Medication Sig Dispense Refill  . BENICAR 40 MG tablet TAKE 1 TABLET BY MOUTH DAILY  30 tablet  5  . budesonide-formoterol (SYMBICORT) 80-4.5 MCG/ACT inhaler Inhale 2 puffs into the lungs 2 (two) times daily.      [name] Kitchen demeclocycline (DECLOMYCIN) 150 MG tablet Take 1 tablet (150 mg total) by mouth 2 (two) times daily.  60 tablet  3  . diltiazem (TIAZAC) 300 MG 24 hr capsule Take 300 mg by mouth daily.      [name] Kitchen doxycycline (VIBRA-TABS) 100 MG tablet Take 1 tablet (100 mg total) by mouth 2 (two) times daily. Take after meals and avoid sunlight.  14 tablet  0  . fluticasone (FLONASE) 50  MCG/ACT nasal spray Place 2 sprays into the nose daily.      [name] Kitchen levothyroxine (SYNTHROID, LEVOTHROID) 50 MCG tablet Take 1 tablet (50 mcg total) by mouth daily.  90 tablet  1  . LORazepam (ATIVAN) 0.5 MG tablet TAKE 1 TABLET BY MOUTH EVERY 8 HOURS  90 tablet  0  . MAGNESIUM PO With Calcium, Takes 2 tbsp by mouth daily      . meclizine (ANTIVERT) 25 MG tablet Take 25 mg by mouth 3 (three) times daily as needed. vertigo      . mesalamine (LIALDA) 1.2 G EC tablet Take 2 tablets (2.4 g total) by mouth daily with breakfast.  60 tablet  3  . Multiple Vitamin (MULTIVITAMIN) LIQD Take 15 mLs by mouth daily.      [name] Kitchen olmesartan (BENICAR) 40 MG tablet Take 1 tablet (40 mg total) by mouth daily.  30 tablet  5  . omeprazole (PRILOSEC) 20 MG capsule Take 20 mg by mouth daily.        . Potassium (POTASSIMIN) 75 MG TABS Take 1 tablet by mouth daily. 1  tab po qd      . pravastatin (PRAVACHOL) 20 MG tablet Take 20 mg by mouth daily.      . pravastatin (PRAVACHOL) 20 MG tablet TAKE 1 TABLET BY MOUTH DAILY  90 tablet  0  . predniSONE (DELTASONE) 10 MG tablet Take 4 tabs po x 2 days, then 2 x 2 days, then 1 x 2 days, then 1/2 x 2 days then STOP  20 tablet  0  . psyllium (METAMUCIL) 58.6 % powder Take 1 packet by mouth daily.      . SYMBICORT 80-4.5 MCG/ACT inhaler INHALE 2 PUFFS INTO THE LUNGS TWICE DAILY  10.2 g  0  . warfarin (COUMADIN) 2.5 MG tablet Take 1.25-2.5 mg by mouth daily. Takes 1 tablet(2.5mg ) on tues, thurs, sat, Sunday, and takes 1/2tablets(1.25mg ) on mon. Wed, and fri        Allergies  Allergen Reactions  . Biaxin (Clarithromycin) Nausea And Vomiting  . Flagyl (Metronidazole) Nausea And Vomiting  . Moxifloxacin     REACTION: weakness  . Pantoprazole Sodium     REACTION: rash  . Penicillins     REACTION: rash    Family History  Problem Relation Age of Onset  . Hypertension Mother   . Atrial fibrillation      siblings  . Lung cancer Brother   . Emphysema Brother   . Other Father     miner's lung  . Colon polyps Sister   . Pancreatic cancer Sister   . Kidney disease Sister     BP 130/78  Pulse 87  Temp 98.6 F (37 C) (Oral)  Wt 185 lb (83.915 kg)  SpO2 98%   Review of Systems Denies vomiting    Objective:   Physical Exam VITAL SIGNS:  See vs page GENERAL: no distress Ext: trace bilat leg edema Gait: normal and steady      Assessment & Plan:  Hyponatremia, with ? Of intolerance to declomycin

## 2012-07-07 NOTE — Patient Instructions (Addendum)
blood tests are being requested for you today.  We'll contact you with results. Please try taking the demeclocycline again.   Please come back for a follow-up appointment for 1 month.

## 2012-07-11 ENCOUNTER — Encounter: Payer: Self-pay | Admitting: *Deleted

## 2012-07-11 ENCOUNTER — Ambulatory Visit (INDEPENDENT_AMBULATORY_CARE_PROVIDER_SITE_OTHER): Payer: BC Managed Care – PPO | Admitting: Family Medicine

## 2012-07-11 ENCOUNTER — Encounter: Payer: Self-pay | Admitting: Family Medicine

## 2012-07-11 VITALS — BP 130/70 | HR 75 | Temp 98.4°F | Wt 186.4 lb

## 2012-07-11 DIAGNOSIS — E871 Hypo-osmolality and hyponatremia: Secondary | ICD-10-CM | POA: Diagnosis not present

## 2012-07-11 DIAGNOSIS — R42 Dizziness and giddiness: Secondary | ICD-10-CM

## 2012-07-11 LAB — BASIC METABOLIC PANEL
BUN: 12 mg/dL (ref 6–23)
CO2: 27 mEq/L (ref 19–32)
Calcium: 9.8 mg/dL (ref 8.4–10.5)
Chloride: 100 mEq/L (ref 96–112)
Creatinine, Ser: 0.7 mg/dL (ref 0.4–1.2)
GFR: 92.31 mL/min (ref >60.00)
Glucose, Bld: 87 mg/dL (ref 70–99)
Potassium: 4.2 mEq/L (ref 3.5–5.1)
Sodium: 134 mEq/L — ABNORMAL LOW (ref 135–145)

## 2012-07-11 NOTE — Patient Instructions (Signed)
Hyponatremia   Hyponatremia is when the amount of salt (sodium) in your blood is too low. When sodium levels are low, your cells will absorb extra water and swell. The swelling happens throughout the body, but it mostly affects the brain. Severe brain swelling (cerebral edema), seizures, or coma can happen.   CAUSES    Heart, kidney, or liver problems.   Thyroid problems.   Adrenal gland problems.   Severe vomiting and diarrhea.   Certain medicines or illegal drugs.   Dehydration.   Drinking too much water.   Low-sodium diet.  SYMPTOMS    Nausea and vomiting.   Confusion.   Lethargy.   Agitation.   Headache.   Twitching or shaking (seizures).   Unconsciousness.   Appetite loss.   Muscle weakness and cramping.  DIAGNOSIS   Hyponatremia is identified by a simple blood test. Your caregiver will perform a history and physical exam to try to find the cause and type of hyponatremia. Other tests may be needed to measure the amount of sodium in your blood and urine.  TREATMENT   Treatment will depend on the cause.    Fluids may be given through the vein (IV).   Medicines may be used to correct the sodium imbalance. If medicines are causing the problem, they will need to be adjusted.   Water or fluid intake may be restricted to restore proper balance.  The speed of correcting the sodium problem is very important. If the problem is corrected too fast, nerve damage (sometimes unchangeable) can happen.  HOME CARE INSTRUCTIONS    Only take medicines as directed by your caregiver. Many medicines can make hyponatremia worse. Discuss all your medicines with your caregiver.   Carefully follow any recommended diet, including any fluid restrictions.   You may be asked to repeat lab tests. Follow these directions.   Avoid alcohol and recreational drugs.  SEEK MEDICAL CARE IF:    You develop worsening nausea, fatigue, headache, confusion, or weakness.   Your original hyponatremia symptoms return.   You have  problems following the recommended diet.  SEEK IMMEDIATE MEDICAL CARE IF:    You have a seizure.   You faint.   You have ongoing diarrhea or vomiting.  MAKE SURE YOU:    Understand these instructions.   Will watch your condition.   Will get help right away if you are not doing well or get worse.  Document Released: 06/08/2002 Document Revised: 09/10/2011 Document Reviewed: 12/03/2010  ExitCare Patient Information 2013 [employer].

## 2012-07-11 NOTE — Assessment & Plan Note (Signed)
I advised pt to call endo and tell them the med make her dizzy. Recheck bmp today rto prn

## 2012-07-11 NOTE — Progress Notes (Signed)
  Subjective:    Patient ID: Stephanie Cortez, female    DOB: 12/06/1933, 79 y.o.   MRN: [id number]  HPI Pt here c/o dizziness with demeclocycline 150mg  from endo-- she stopped it and dizziness resolved except for on occasion.    She is not sure what to do.  Review of Systems    as above Objective:   Physical Exam BP 130/70  Pulse 75  Temp 98.4 F (36.9 C) (Oral)  Wt 186 lb 6.4 oz (84.55 kg)  SpO2 98% General appearance: alert, cooperative, appears stated age and no distress Lungs: clear to auscultation bilaterally Heart: S1, S2 normal Extremities: extremities normal, atraumatic, no cyanosis or edema       Assessment & Plan:

## 2012-07-17 ENCOUNTER — Telehealth: Payer: Self-pay | Admitting: *Deleted

## 2012-07-17 NOTE — Telephone Encounter (Signed)
Pt left msg stating that she saw her BMP results on mychart but she wanted to know what her urinalysis results are. Please advise.

## 2012-07-18 NOTE — Telephone Encounter (Signed)
I made the patient aware Urinalysis was not done, it was a UDS. She voiced understanding.  Call ended.     KP

## 2012-07-22 DIAGNOSIS — M7750 Other enthesopathy of unspecified foot: Secondary | ICD-10-CM | POA: Diagnosis not present

## 2012-07-25 ENCOUNTER — Ambulatory Visit (INDEPENDENT_AMBULATORY_CARE_PROVIDER_SITE_OTHER): Payer: BC Managed Care – PPO | Admitting: *Deleted

## 2012-07-25 DIAGNOSIS — I4891 Unspecified atrial fibrillation: Secondary | ICD-10-CM | POA: Diagnosis not present

## 2012-07-25 LAB — POCT INR: INR: 2.1

## 2012-07-30 ENCOUNTER — Other Ambulatory Visit: Payer: Self-pay | Admitting: Family Medicine

## 2012-07-30 NOTE — Telephone Encounter (Signed)
Last seen 07/11/12 and filled 02/06/12. Please advise     KP

## 2012-08-06 ENCOUNTER — Other Ambulatory Visit: Payer: Self-pay | Admitting: Internal Medicine

## 2012-08-06 DIAGNOSIS — M4310 Spondylolisthesis, site unspecified: Secondary | ICD-10-CM | POA: Diagnosis not present

## 2012-08-07 ENCOUNTER — Telehealth: Payer: Self-pay | Admitting: Internal Medicine

## 2012-08-07 MED ORDER — BUDESONIDE-FORMOTEROL FUMARATE 80-4.5 MCG/ACT IN AERO: 2.0000 | INHALATION_SPRAY | Freq: Two times a day (BID) | RESPIRATORY_TRACT | Status: DC

## 2012-08-07 NOTE — Telephone Encounter (Signed)
Pt is aware rx has been sent. Nothing further was needed 

## 2012-08-15 ENCOUNTER — Other Ambulatory Visit: Payer: Self-pay | Admitting: Family Medicine

## 2012-08-16 NOTE — Telephone Encounter (Signed)
Please advise on RF request.  Last OV:07-11-12.//AB/CMA

## 2012-08-18 ENCOUNTER — Telehealth: Payer: Self-pay | Admitting: Family Medicine

## 2012-08-18 ENCOUNTER — Other Ambulatory Visit: Payer: Self-pay | Admitting: Family Medicine

## 2012-08-18 DIAGNOSIS — E871 Hypo-osmolality and hyponatremia: Secondary | ICD-10-CM

## 2012-08-18 NOTE — Telephone Encounter (Signed)
Future orders in the system, please schedule     KP

## 2012-08-18 NOTE — Telephone Encounter (Signed)
Patient states she needs to have repeat bmet done to check her sodium level. Please advise.

## 2012-08-19 NOTE — Telephone Encounter (Signed)
Pt scheduled for 08/21/12. °

## 2012-08-19 NOTE — Telephone Encounter (Signed)
Patient states the pharmacy never received rx for lorazepam that we show sent 08/15/12.

## 2012-08-21 ENCOUNTER — Ambulatory Visit (INDEPENDENT_AMBULATORY_CARE_PROVIDER_SITE_OTHER): Payer: BC Managed Care – PPO | Admitting: Family Medicine

## 2012-08-21 ENCOUNTER — Encounter: Payer: Self-pay | Admitting: Family Medicine

## 2012-08-21 ENCOUNTER — Other Ambulatory Visit: Payer: BC Managed Care – PPO

## 2012-08-21 VITALS — BP 124/62 | HR 71 | Temp 98.3°F | Wt 187.2 lb

## 2012-08-21 DIAGNOSIS — E871 Hypo-osmolality and hyponatremia: Secondary | ICD-10-CM | POA: Diagnosis not present

## 2012-08-21 DIAGNOSIS — R42 Dizziness and giddiness: Secondary | ICD-10-CM

## 2012-08-21 NOTE — Patient Instructions (Signed)

## 2012-08-22 ENCOUNTER — Other Ambulatory Visit: Payer: Self-pay | Admitting: Neurosurgery

## 2012-08-22 DIAGNOSIS — M545 Low back pain, unspecified: Secondary | ICD-10-CM

## 2012-08-22 LAB — BASIC METABOLIC PANEL
BUN: 10 mg/dL (ref 6–23)
CO2: 25 mEq/L (ref 19–32)
Calcium: 9.8 mg/dL (ref 8.4–10.5)
Chloride: 100 mEq/L (ref 96–112)
Creatinine, Ser: 0.7 mg/dL (ref 0.4–1.2)
GFR: 86.23 mL/min (ref >60.00)
Glucose, Bld: 89 mg/dL (ref 70–99)
Potassium: 4.1 mEq/L (ref 3.5–5.1)
Sodium: 132 mEq/L — ABNORMAL LOW (ref 135–145)

## 2012-08-22 NOTE — Assessment & Plan Note (Signed)
Recheck today. 

## 2012-08-22 NOTE — Assessment & Plan Note (Signed)
ent and endo w/u neg Refer to neuro

## 2012-08-22 NOTE — Addendum Note (Signed)
Addended by: Silvio Pate D on: 08/22/2012 08:07 AM   Modules accepted: Orders

## 2012-08-22 NOTE — Progress Notes (Signed)
  Subjective:    Patient ID: Stephanie Cortez, female    DOB: 10-Mar-1934, 79 y.o.   MRN: [id number]  HPI Pt is here c/o dizziness that cont.  She has seen endo with low Na and ENT and no cause for dizziness has been found.  Ent discussed going to Neuro .   We had discussed that previously as well  No new symptoms.   Review of Systems As aove    Objective:   Physical Exam BP 124/62  Pulse 71  Temp(Src) 98.3 F (36.8 C) (Oral)  Wt 187 lb 3.2 oz (84.913 kg)  BMI 30.45 kg/m2  SpO2 98% General appearance: alert, cooperative, appears stated age and no distress Ears: normal TM's and external ear canals both ears Nose: Nares normal. Septum midline. Mucosa normal. No drainage or sinus tenderness. Throat: lips, mucosa, and tongue normal; teeth and gums normal Lungs: clear to auscultation bilaterally Heart: S1, S2 normal       Assessment & Plan:

## 2012-08-24 ENCOUNTER — Encounter: Payer: Self-pay | Admitting: Family Medicine

## 2012-08-27 ENCOUNTER — Ambulatory Visit (INDEPENDENT_AMBULATORY_CARE_PROVIDER_SITE_OTHER): Payer: BC Managed Care – PPO | Admitting: Adult Health

## 2012-08-27 ENCOUNTER — Encounter: Payer: Self-pay | Admitting: Adult Health

## 2012-08-27 VITALS — BP 130/70 | HR 74 | Temp 97.5°F | Ht 67.0 in | Wt 190.4 lb

## 2012-08-27 DIAGNOSIS — J209 Acute bronchitis, unspecified: Secondary | ICD-10-CM | POA: Diagnosis not present

## 2012-08-27 MED ORDER — LEVALBUTEROL HCL 0.63 MG/3ML IN NEBU
0.6300 mg | INHALATION_SOLUTION | Freq: Once | RESPIRATORY_TRACT | Status: AC
  Administered 2012-08-27: 0.63 mg via RESPIRATORY_TRACT

## 2012-08-27 MED ORDER — DOXYCYCLINE HYCLATE 100 MG PO TABS: 100.0000 mg | ORAL_TABLET | Freq: Two times a day (BID) | ORAL | Status: DC

## 2012-08-27 NOTE — Patient Instructions (Addendum)
Doxycyline 100mg  Twice daily  For 7 days  Mucinex DM Twice daily  As needed  Cough/congestion  Fluids and rest  Saline nasal rinses As needed   Please contact office for sooner follow up if symptoms do not improve or worsen or seek emergency care  follow up Dr. Marchelle Gearing as planned and As needed

## 2012-08-27 NOTE — Assessment & Plan Note (Signed)
Flare  xopenex neb in office x 1   Plan  Doxycyline 100mg  Twice daily  For 7 days  Mucinex DM Twice daily  As needed  Cough/congestion  Fluids and rest  Saline nasal rinses As needed   Please contact office for sooner follow up if symptoms do not improve or worsen or seek emergency care  follow up Dr. Marchelle Gearing as planned and As needed

## 2012-08-27 NOTE — Progress Notes (Signed)
Subjective:    Patient ID: Stephanie Cortez, female    DOB: [birth year], 79 y.o.   MRN: [id number]  HPI 79 yo WF with known hx of Bronchietasis  Follouwp Bronchiectasis. Last ollowup Bronchiectasis. Last seen August 2010, Dec 2010, Jan 2011, FEb 2011 (AE bronciectasis), and  Oct 2011. S/p pulmonary rehab in summer 2011.  Since then had one more visit in Nov 2011 for  AE bronchiectasis. Since then doing well. Denies cough or dyspnea but feels chest is just a bit more tight than usual. . No other specific complaints.  Denies chest pain, dyspnea, orthopnea, hemoptysis, fever, n/v/d, edema, headache.  However, does admit to increased fatigue  + . Uses symbicort as needed which is around 2-3 times per week. Is requesting spirometry  >>rx  symbicort daily    OV 04/12/11:  Using symbicort prn and overall doing well. Only has minimal wheeze. She is anticipatory and pro-acitve in care. When sinus congestion develops uses netti pot and lemon-honey vapors for relief. This prevents wheezing. Typically winter is when she has AE bronchiectasis. Wants to know if she should take prn symbicort or go on daily.  >>no changes   05/23/2011 Acute OV  Complains of head congestion, clear mucus production, PND, chest congestion, cough x1week .  Taking mucinex with some help. Mucus is all clear. No fever or discolored mucus.  Worried that she will be sick over the holidays and she is leaving to go out of town.  She is using netti pot with good relief. She has increased her symbicort Twice daily  With decreased cough.  >>rx doxycycline  11/29 /2012 Follow up  Pt returns for follow up . She is feeling is better. Currently  taking doxycycline.  reports breathing has improved but still having sinuscongestion and drainage. Last ov she was given Augmentin -unfortunately has hx of PCN allergy and abx was changed prior to taking. She is tolerating abx well. Congestion and cough are much better.  No fever or chest pain.  >>No  changes   06/19/2011 Acute OV Complains of wheezing, head congestion with green drainage, hacky cough x3-4days. OTC not helping. Using Netti pot rinses .  No hemotpysis . Worse in am and late at night.  Cough is getting  Worse. Does not want to be sick for the holidays-has too much to do.  Under a lot of stress with sister that is ill. Traveling back and forth to [place].     Past Med: has changed PMD to Dr Laury Axon.  Social: taking care of elder sister in her 20s who has now become dependent Fam hx: as in soccial. No other change   REC Continue on Symbicort 2 puffs Twice daily Until back to your baseline  Mucinex Twice daily As needed Cough/congestion  Fluids and rest  I have given you a prescription for antibiotic--Zpack To have on hold in case  You developed discolored mucus that is not improving.  Saline nasal rinses As needed  Please contact office for sooner follow up if symptoms do not improve or worsen or seek emergency care  follow up Dr. Marchelle Gearing as planned and As needed   OV 08/01/2011  D 23 of 28 biaxin. Blocked sinus. Sinus pressure. Hx of eustachian tube grommets. No resp issues. Netti pot not working - too blocked. Denies fever, sputum, resp exacerbation, wheeze, edema, orthopnea, paroxysmal nocturnal dyspnea    Currently lungs appear ok  Our coordinator will find out who saaw you in [facility]. We will  try to have you seen this week  REturn in 3 months or sooner if needed  Please continue with your medications  OV 10/30/2011  Sinus drainage copious - intolerant to netti pot  - burns, does not want nasal steroids or anti histamine. Wants to go back to ENT. This is making tickle in throat that is severe x 1 week with mucus stuck in throat and gag. CT abdomen lung cut April 2013 for abdominal pain shows clear lung fields with some basal atelectasis. Do not see any basal bronchiectasis  Past, Family, Social reviewed: gi issues, gi scope pending  Currently lungs appear  ok  Our nurse will have you see Dr in [facility] asap for sinus Issues  Take care of your gi issues  REturn in 6 months or sooner if needed  Please continue with your medications  #Overweight  - Victorino Dike will show you the [facility] lipid diet sheet and how to use it  #Followup  - 6 - 9 months or sooner if needed  - spirometry at followup   OV 04/29/2012  Overall ok. Younger sister in [place] died. She is sad. She is having  1 week of increased cough with chest tightness but no sputum or fever. Is asociated with scratchy throat. She feels antibiptics indicated because she wants to make it to funeral. She wants prednisone on standby. No other new issues. Has seen ENT  08/27/2012 Acute OV  Complains of having nasal congestion, wheezing, prod cough at times w green/yellow mucus x2-3 weeks. Has been using netti pot and flonase and symbicort .  No hemoptysis, chest pain , edema or n/v.    Review of Systems  Constitutional:   No  weight loss, night sweats,  Fevers, chills, fatigue, or  lassitude.  HEENT:   No headaches,  Difficulty swallowing,  Tooth/dental problems, or  Sore throat,                No sneezing, itching, ear ache,  +nasal congestion, post nasal drip,   CV:  No chest pain,  Orthopnea, PND, swelling in lower extremities, anasarca, dizziness, palpitations, syncope.   GI  No heartburn, indigestion, abdominal pain, nausea, vomiting, diarrhea, change in bowel habits, loss of appetite, bloody stools.   Resp:    No chest wall deformity  Skin: no rash or lesions.  GU: no dysuria, change in color of urine, no urgency or frequency.  No flank pain, no hematuria   MS:  No joint pain or swelling.  No decreased range of motion.  No back pain.  Psych:  No change in mood or affect. No depression or anxiety.  No memory loss.         Objective:   Physical Exam GEN: A/Ox3; pleasant , NAD, well nourished   HEENT:  [place]/AT,  EACs-clear, TMs-wnl, NOSE-clear mucus,, THROAT-clear, no lesions, no  postnasal drip or exudate noted.    NECK:  Supple w/ fair ROM; no JVD; normal carotid impulses w/o bruits; no thyromegaly or nodules palpated; no lymphadenopathy.  RESP  Few rhonchi  w/o, wheezes/ rales no accessory muscle use, no dullness to percussion  CARD:  RRR, no m/r/g  , no peripheral edema, pulses intact, no cyanosis or clubbing.  GI:   Soft & nt; nml bowel sounds; no organomegaly or masses detected.  Musco: Warm bil, no deformities or joint swelling noted.   Neuro: alert, no focal deficits noted.    Skin: Warm, no lesions or rashes  Assessment & Plan:

## 2012-08-27 NOTE — Addendum Note (Signed)
Addended by: Orma Flaming D on: 08/27/2012 11:27 AM   Modules accepted: Orders

## 2012-08-30 ENCOUNTER — Other Ambulatory Visit: Payer: BC Managed Care – PPO

## 2012-09-03 ENCOUNTER — Ambulatory Visit (INDEPENDENT_AMBULATORY_CARE_PROVIDER_SITE_OTHER): Payer: BC Managed Care – PPO | Admitting: Internal Medicine

## 2012-09-03 ENCOUNTER — Encounter: Payer: Self-pay | Admitting: Internal Medicine

## 2012-09-03 ENCOUNTER — Ambulatory Visit (INDEPENDENT_AMBULATORY_CARE_PROVIDER_SITE_OTHER): Payer: BC Managed Care – PPO | Admitting: *Deleted

## 2012-09-03 VITALS — BP 148/79 | HR 76 | Ht 67.0 in | Wt 189.2 lb

## 2012-09-03 DIAGNOSIS — R079 Chest pain, unspecified: Secondary | ICD-10-CM | POA: Insufficient documentation

## 2012-09-03 DIAGNOSIS — F439 Reaction to severe stress, unspecified: Secondary | ICD-10-CM

## 2012-09-03 DIAGNOSIS — Z733 Stress, not elsewhere classified: Secondary | ICD-10-CM | POA: Diagnosis not present

## 2012-09-03 DIAGNOSIS — I10 Essential (primary) hypertension: Secondary | ICD-10-CM | POA: Diagnosis not present

## 2012-09-03 DIAGNOSIS — I4891 Unspecified atrial fibrillation: Secondary | ICD-10-CM

## 2012-09-03 LAB — POCT INR: INR: 3.8

## 2012-09-03 NOTE — Assessment & Plan Note (Signed)
A lttle bit elevated

## 2012-09-03 NOTE — Progress Notes (Signed)
Patient Care Team: [name], DO as PCP - General (Family Medicine)   HPI  Stephanie Cortez is a 79 y.o. female Seen in followup for long-standing atrial fibrillation dating back about 10 years. It is felt to be permanent having failed multiple cardioversions and antiarrhythmic drugs.  She was last seen in October when , having been seen in [place] a few weeks before a complaint of heart racing, she was referred back. She had been under great deal of psychosocial stress take care of her sister  She has complaints of this unusual head sensation which he feels pulling on the left side. It is associated with dizziness that is not positional. She lays down 3045 minutes and goes away it is associated with some fatigue. This is much worse in the context of a stress with her sister which is ongoing. She did see a Veterinary surgeon. thise unfortunately was not particularly useful.  She also complains of chest discomfort lasting one to 2 minutes it may be related to exertion they do not. Is described as a heaviness with radiation to the neck. There is no associated dyspnea.  Past Medical History  Diagnosis Date  . Bronchiectasis     >PFT 07/13/2008 in [place]  Fev 1.9L/76%, FVC 2.45L/74, Ratio 79, TLC 121%, DLCO 64%  AE BRonchiectasis - Dec 2010.New Rx:  outpatient - Feb 2011 - Rx outpatient  . Hypothyroidism   . Dyslipidemia   . Eczema   . Anemia     - Hgb 9.7gm% on 07/13/2008 in [place] -  Hgg 129gm% wiht normal irone levsl and ferritin 10/27/2008 in [place] Recurrent otitis/sinusitis  . Atrial fibrillation   . Glaucoma(365)   . Back pain 2009  . Anxiety   . Diverticulosis 11/14/2011  . COPD (chronic obstructive pulmonary disease)     never smoked  . Shortness of breath   . Hypertension   . GERD (gastroesophageal reflux disease)   . Arthritis   . IBS (irritable bowel syndrome) 11/14/2011  . Esophageal stenosis 11/14/2011  . Hemorrhoids   . H. pylori infection 10/08/2011     Past Surgical History   Procedure Laterality Date  . Cataract extraction      both  . Foot surgery  03/14/2011    gastroc slide-rt  . Total abdominal hysterectomy    . Colonoscopy    . Breast surgery      br bx  . Esophagoscopy w/ botox injection  12/11/2011    Procedure: ESOPHAGOSCOPY WITH BOTOX INJECTION;  Surgeon: Drema Halon, MD;  Location: [facility];  Service: ENT;  Laterality: N/A;  esophogoscopy with dilation, botox injection    Current Outpatient Prescriptions  Medication Sig Dispense Refill  . budesonide-formoterol (SYMBICORT) 80-4.5 MCG/ACT inhaler Inhale 2 puffs into the lungs 2 (two) times daily.  1 Inhaler  5  . diltiazem (TIAZAC) 300 MG 24 hr capsule Take 300 mg by mouth daily.      [name] Kitchen doxycycline (VIBRA-TABS) 100 MG tablet Take 1 tablet (100 mg total) by mouth 2 (two) times daily.  14 tablet  0  . fluticasone (FLONASE) 50 MCG/ACT nasal spray Place 2 sprays into the nose daily.      [name] Kitchen levothyroxine (SYNTHROID, LEVOTHROID) 50 MCG tablet Take 1 tablet (50 mcg total) by mouth daily.  90 tablet  1  . LORazepam (ATIVAN) 0.5 MG tablet TAKE 1 TABLET BY MOUTH EVERY 8 HOURS AS NEEDED  90 tablet  0  . MAGNESIUM PO With Calcium, Takes 2  tbsp by mouth daily      . meclizine (ANTIVERT) 25 MG tablet Take 25 mg by mouth 3 (three) times daily as needed. vertigo      . mesalamine (LIALDA) 1.2 G EC tablet Take 2 tablets (2.4 g total) by mouth daily with breakfast.  60 tablet  3  . Multiple Vitamin (MULTIVITAMIN) LIQD Take 15 mLs by mouth daily.      [name] Kitchen nystatin (MYCOSTATIN) 100000 UNIT/ML suspension TAKE 5 ML BY MOUTH FOUR TIMES DAILY  120 mL  0  . olmesartan (BENICAR) 40 MG tablet Take 1 tablet (40 mg total) by mouth daily.  30 tablet  5  . omeprazole (PRILOSEC) 20 MG capsule Take 20 mg by mouth daily.        . Potassium (POTASSIMIN) 75 MG TABS Take 1 tablet by mouth daily. 1 tab po qd      . pravastatin (PRAVACHOL) 20 MG tablet TAKE 1 TABLET BY MOUTH DAILY  90 tablet  0  . psyllium  (METAMUCIL) 58.6 % powder Take 1 packet by mouth daily.      [name] Kitchen warfarin (COUMADIN) 2.5 MG tablet Take 1.25-2.5 mg by mouth daily. Takes 1 tablet(2.5mg ) on tues, thurs, sat, Sunday, and takes 1/2tablets(1.25mg ) on mon. Wed, and fri       No current facility-administered medications for this visit.    Allergies  Allergen Reactions  . Biaxin (Clarithromycin) Nausea And Vomiting  . Flagyl (Metronidazole) Nausea And Vomiting  . Moxifloxacin     REACTION: weakness  . Pantoprazole Sodium     REACTION: rash  . Penicillins     REACTION: rash    Review of Systems negative except from HPI and PMH  Physical Exam BP 148/79  Pulse 76  Ht 5\' 7"  (1.702 m)  Wt 189 lb 3.2 oz (85.821 kg)  BMI 29.63 kg/m2 Well developed and well nourished in no acute distress HENT normal E scleral and icterus clear Neck Supple JVP flat; carotids brisk and full Clear to ausculation Irregularly irregular  rate and rhythm, no murmurs gallops or rub Soft with active bowel sounds No clubbing cyanosis none Edema Alert and oriented, grossly normal motor and sensory function Skin Warm and Dry  ECG demonstrates atrial fibrillation at 74 intervals-/08/27 the axis XV Nonspecific ST segment flattening Assessment and  Plan

## 2012-09-03 NOTE — Assessment & Plan Note (Signed)
Chest pain with typical and atypical features in the context of the electrocardiogram with ST segment flattening. We'll undertake Myoview scanning for risk stratification

## 2012-09-03 NOTE — Assessment & Plan Note (Signed)
I suspect and raised the issue with the patient is whether she might be depressed from all distress. She is agreeable to that diagnosis. She is to see Dr. Felicity Coyer later this month. Recommend that \\that they consider antidepressant therapy

## 2012-09-03 NOTE — Patient Instructions (Addendum)
Your physician wants you to follow-up in: October with Dr Logan Bores will receive a reminder letter in the mail two months in advance. If you don't receive a letter, please call our office to schedule the follow-up appointment. Your physician has requested that you have a lexiscan myoview. For further information please visit [email or web address] Please follow instruction sheet, as given.

## 2012-09-03 NOTE — Assessment & Plan Note (Signed)
Stable and permanent

## 2012-09-04 ENCOUNTER — Telehealth: Payer: Self-pay | Admitting: Family Medicine

## 2012-09-04 ENCOUNTER — Ambulatory Visit
Admission: RE | Admit: 2012-09-04 | Discharge: 2012-09-04 | Disposition: A | Discharge status: Home / Self Care | Payer: BC Managed Care – PPO | Source: Ambulatory Visit | Attending: Neurosurgery | Admitting: Neurosurgery

## 2012-09-04 DIAGNOSIS — M47817 Spondylosis without myelopathy or radiculopathy, lumbosacral region: Secondary | ICD-10-CM | POA: Diagnosis not present

## 2012-09-04 DIAGNOSIS — M5137 Other intervertebral disc degeneration, lumbosacral region: Secondary | ICD-10-CM | POA: Diagnosis not present

## 2012-09-04 DIAGNOSIS — M545 Low back pain, unspecified: Secondary | ICD-10-CM

## 2012-09-04 DIAGNOSIS — M48061 Spinal stenosis, lumbar region without neurogenic claudication: Secondary | ICD-10-CM | POA: Diagnosis not present

## 2012-09-04 NOTE — Telephone Encounter (Signed)
noted 

## 2012-09-04 NOTE — Telephone Encounter (Signed)
In reference to neurology referral, patient has declined.  Per phone call from [place] with [facility], she contacted the patient to schedule her for an appointment, patient stated she just has too much going on at this time.  Kayla informed patient they will hold her referral for 3 months if she decides within that time she would like to call them back and make an appointment.

## 2012-09-04 NOTE — Telephone Encounter (Signed)
Forwarded to MD for review--  KP

## 2012-09-15 ENCOUNTER — Encounter (HOSPITAL_COMMUNITY): Payer: BC Managed Care – PPO

## 2012-09-22 ENCOUNTER — Ambulatory Visit (HOSPITAL_COMMUNITY): Payer: BC Managed Care – PPO | Attending: Cardiovascular Disease | Admitting: Radiology

## 2012-09-22 ENCOUNTER — Ambulatory Visit (INDEPENDENT_AMBULATORY_CARE_PROVIDER_SITE_OTHER): Payer: BC Managed Care – PPO | Admitting: Pharmacist

## 2012-09-22 VITALS — BP 116/54 | Ht 67.0 in | Wt 185.0 lb

## 2012-09-22 DIAGNOSIS — I4891 Unspecified atrial fibrillation: Secondary | ICD-10-CM

## 2012-09-22 DIAGNOSIS — R079 Chest pain, unspecified: Secondary | ICD-10-CM

## 2012-09-22 DIAGNOSIS — R0602 Shortness of breath: Secondary | ICD-10-CM | POA: Insufficient documentation

## 2012-09-22 DIAGNOSIS — R5381 Other malaise: Secondary | ICD-10-CM | POA: Insufficient documentation

## 2012-09-22 DIAGNOSIS — R002 Palpitations: Secondary | ICD-10-CM | POA: Insufficient documentation

## 2012-09-22 DIAGNOSIS — R42 Dizziness and giddiness: Secondary | ICD-10-CM | POA: Insufficient documentation

## 2012-09-22 LAB — POCT INR: INR: 2.9

## 2012-09-22 MED ORDER — TECHNETIUM TC 99M SESTAMIBI GENERIC - CARDIOLITE
33.0000 | Freq: Once | INTRAVENOUS | Status: AC | PRN
  Administered 2012-09-22: 33 via INTRAVENOUS

## 2012-09-22 MED ORDER — TECHNETIUM TC 99M SESTAMIBI GENERIC - CARDIOLITE
11.0000 | Freq: Once | INTRAVENOUS | Status: AC | PRN
  Administered 2012-09-22: 11 via INTRAVENOUS

## 2012-09-22 MED ORDER — REGADENOSON 0.4 MG/5ML IV SOLN
0.4000 mg | Freq: Once | INTRAVENOUS | Status: AC
  Administered 2012-09-22: 0.4 mg via INTRAVENOUS

## 2012-09-22 NOTE — Progress Notes (Signed)
[facility] SITE 3 NUCLEAR MED [address] [place], [place] [address] [phone]    Cardiology Nuclear Med Study  Stephanie Cortez is a 79 y.o. female     MRN : [id number]     DOB: Oct 22, 1933  Procedure Date: 09/22/2012  Nuclear Med Background Indication for Stress Test:  Evaluation for Ischemia History:  COPD and AFIB Several Cardioversions-all unsuccessful, anemia Cardiac Risk Factors: Hypertension and Lipids  Symptoms:  Chest Pain, Dizziness, Fatigue, Light-Headedness, Palpitations and SOB   Nuclear Pre-Procedure Caffeine/Decaff Intake:  None NPO After: 8:00am   Lungs:  clear O2 Sat: 97% on room air. IV 0.9% NS with Angio Cath:  22g  IV Site: R Wrist  IV Started by:  Stanton Kidney, EMT-P  Chest Size (in):  40 Cup Size: D  Height: 5\' 7"  (1.702 m)  Weight:  185 lb (83.915 kg)  BMI:  Body mass index is 28.97 kg/(m^2). Tech Comments:  NA    Nuclear Med Study 1 or 2 day study: 1 day  Stress Test Type:  Eugenie Birks  Reading MD: Charlton Haws, MD  Order Authorizing Provider:  S.Klein MD  Resting Radionuclide: Technetium 73m Sestamibi  Resting Radionuclide Dose: 11.0 mCi   Stress Radionuclide:  Technetium 84m Sestamibi  Stress Radionuclide Dose: 33.0 mCi           Stress Protocol Rest HR: 55 Stress HR: 68  Rest BP: 116/54 Stress BP: 125/57  Exercise Time (min): n/a METS: n/a   Predicted Max HR: 143 bpm % Max HR: 47.55 bpm Rate Pressure Product: 8500   Dose of Adenosine (mg):  n/a Dose of Lexiscan: 0.4 mg  Dose of Atropine (mg): n/a Dose of Dobutamine: n/a mcg/kg/min (at max HR)  Stress Test Technologist: Milana Na, EMT-P  Nuclear Technologist:  Domenic Polite, CNMT     Rest Procedure:  Myocardial perfusion imaging was performed at rest 45 minutes following the intravenous administration of Technetium 74m Sestamibi. Rest ECG: Intermitant junctional rhythm  Stress Procedure:  The patient received IV Lexiscan 0.4 mg over 15-seconds.  Technetium 49m  Sestamibi injected at 30-seconds. This patient had wooziness and was lt. Headed on the (L) side of her head with the Lexiscan infusion. Quantitative spect images were obtained after a 45 minute delay. Stress ECG: No significant change from baseline ECG  QPS Raw Data Images:  Normal; no motion artifact; normal heart/lung ratio. Stress Images:  Normal homogeneous uptake in all areas of the myocardium. Rest Images:  Normal homogeneous uptake in all areas of the myocardium. Subtraction (SDS):  Normal Transient Ischemic Dilatation (Normal <1.22):  1.02 Lung/Heart Ratio (Normal <0.45):  0.42  Quantitative Gated Spect Images QGS EDV:  66 ml QGS ESV:  14 ml  Impression Exercise Capacity:  Lexiscan with no exercise. BP Response:  Normal blood pressure response. Clinical Symptoms:  Light headed ECG Impression:  No significant ST segment change suggestive of ischemia. Comparison with Prior Nuclear Study: No images to compare  Overall Impression:  Normal stress nuclear study.  Baseline ECG with intermitant junctional rhythm  LV Ejection Fraction: 78%.  LV Wall Motion:  NL LV Function; NL Wall Motion  Charlton Haws

## 2012-09-23 ENCOUNTER — Ambulatory Visit (INDEPENDENT_AMBULATORY_CARE_PROVIDER_SITE_OTHER): Payer: BC Managed Care – PPO | Admitting: Internal Medicine

## 2012-09-23 ENCOUNTER — Encounter: Payer: Self-pay | Admitting: Internal Medicine

## 2012-09-23 VITALS — BP 112/68 | HR 81 | Temp 97.9°F | Ht 67.0 in | Wt 190.3 lb

## 2012-09-23 DIAGNOSIS — E039 Hypothyroidism, unspecified: Secondary | ICD-10-CM

## 2012-09-23 DIAGNOSIS — F4323 Adjustment disorder with mixed anxiety and depressed mood: Secondary | ICD-10-CM | POA: Diagnosis not present

## 2012-09-23 DIAGNOSIS — E871 Hypo-osmolality and hyponatremia: Secondary | ICD-10-CM

## 2012-09-23 DIAGNOSIS — I4891 Unspecified atrial fibrillation: Secondary | ICD-10-CM

## 2012-09-23 MED ORDER — LORAZEPAM 0.5 MG PO TABS: 0.5000 mg | ORAL_TABLET | Freq: Three times a day (TID) | ORAL | Status: DC | PRN

## 2012-09-23 MED ORDER — FLUOXETINE HCL 10 MG PO CAPS: 10.0000 mg | ORAL_CAPSULE | Freq: Every day | ORAL | Status: DC

## 2012-09-23 NOTE — Assessment & Plan Note (Signed)
Chronic anxiety, and uses lorazepam as needed for same -refill provided Increasing depression related to situational stress (older sister with medical issues) - history of same Discussed treatment options including medcation and counseling - Remote use of Prozac effective 1990s and patient would like to resume same Start low-dose 10 mg daily - we reviewed potential risk/benefit and possible side effects - pt understands and agrees to same  Monitor sodium while on SSRI Consider counseling, patient interested in [place] therapy - she will investigate these options and let me know if referral is needed Extensive counseling and support offered today

## 2012-09-23 NOTE — Patient Instructions (Signed)
It was good to see you today. We have reviewed your prior records including labs and tests today Medications reviewed and updated. We'll start low-dose generic Prozac once daily to help balance mood for situational depression and anxiety. Okay to take this once daily in addition to lorazepam as needed - Your prescription(s) have been submitted to your pharmacy. Please take as directed and contact our office if you believe you are having problem(s) with the medication(s). No other medication changes recommended today Continue working with your other specialists as reviewed Followup in 6 weeks for labs and medication/symptom review, please call sooner if problem

## 2012-09-23 NOTE — Progress Notes (Signed)
Subjective:    Patient ID: Stephanie Cortez, female    DOB: 1933-07-28, 79 y.o.   MRN: [id number]  HPI  New patient to me but known to our practice in addition, here to transfer to [facility] location Reviewed chronic medical issues today  Atrial fibrillation, chronic anticoagulation ongoing - rate controlled - follows with cardiology and Coumadin clinic for same  Hypothyroidism - the patient reports compliance with medication(s) as prescribed. Denies adverse side effects.  GI issues - IBS, GERD, history of esophageal stricture - compliant with PPI medications as prescribed - denies recent weight loss or change in symptoms  Bronchiectasis - no history of smoker - uses Symbicort inhaler twice daily for same - follows with pulmonary annually and as needed - reports symptoms currently well-controlled without recent exacerbation  Complains of "stress" and situational depression/anxiety - precipitated and exacerbated by concern over her older sister's memory and behavior changes (sister in [place], currently [place] and contemplating move to [place]) - remote history of same, on Prozac in the 1990s with good relief. Uses lorazepam as needed for associated anxiety. Denies SI/HI. Also interested in potential counseling, specifically Ephriam Knuckles  Past Medical History  Diagnosis Date  . Bronchiectasis     >PFT 07/13/2008 in [place]  Fev 1.9L/76%, FVC 2.45L/74, Ratio 79, TLC 121%, DLCO 64%  AE BRonchiectasis - Dec 2010.New Rx:  outpatient - Feb 2011 - Rx outpatient  . Hypothyroidism   . Dyslipidemia   . Eczema   . Anemia     - Hgb 9.7gm% on 07/13/2008 in [place] -  Hgg 129gm% wiht normal irone levsl and ferritin 10/27/2008 in [place] Recurrent otitis/sinusitis  . Atrial fibrillation     chronic anticoag  . Glaucoma(365)   . Back pain 2009  . Anxiety   . Diverticulosis   . COPD (chronic obstructive pulmonary disease)     bronchiectasis  . Hypertension   . Arthritis   . IBS (irritable bowel  syndrome)   . H. pylori infection   . GERD with stricture   . Hyponatremia     chronic, s/p endo eval 06/2012  . Hypothyroid    Family History  Problem Relation Age of Onset  . Hypertension Mother   . Stroke Mother   . Atrial fibrillation      siblings  . Lung cancer Brother   . Emphysema Brother   . Other Father     miner's lung  . Cancer Father     Lung  . Colon polyps Sister   . Pancreatic cancer Sister   . Kidney disease Sister    History  Substance Use Topics  . Smoking status: Never Smoker   . Smokeless tobacco: Never Used  . Alcohol Use: No    Review of Systems Constitutional: Negative for fever or weight change.  Respiratory: Negative for cough and shortness of breath.   Cardiovascular: Negative for chest pain or palpitations.  Gastrointestinal: Negative for abdominal pain, no bowel changes.  Musculoskeletal: Negative for gait problem or joint swelling. chronic low back pain, ongoing neurosurgical evaluation (stern) Skin: Negative for rash.  Neurological: Negative for dizziness or headache. +lightheaded sensation when stressed No other specific complaints in a complete review of systems (except as listed in HPI above).     Objective:   Physical Exam BP 112/68  Pulse 81  Temp(Src) 97.9 F (36.6 C) (Oral)  Ht 5\' 7"  (1.702 m)  Wt 190 lb 4.8 oz (86.32 kg)  BMI 29.8 kg/m2  SpO2 97% Wt Readings  from Last 3 Encounters:  09/23/12 190 lb 4.8 oz (86.32 kg)  09/22/12 185 lb (83.915 kg)  09/03/12 189 lb 3.2 oz (85.821 kg)   Constitutional: She appears well-developed and well-nourished. No distress.  HENT: Head: Normocephalic and atraumatic. Ears: B TMs ok, no erythema or effusion; Nose: Nose normal. Mouth/Throat: Oropharynx is clear and moist. No oropharyngeal exudate.  Eyes: Conjunctivae and EOM are normal. Pupils are equal, round, and reactive to light. No scleral icterus.  Neck: Normal range of motion. Neck supple. No JVD present. No thyromegaly present.   Cardiovascular: Normal rate, irregular rhythm and normal heart sounds.  No murmur heard. No BLE edema. Pulmonary/Chest: Effort normal and breath sounds normal. No respiratory distress. She has no wheezes.  Abdominal: Soft. Bowel sounds are normal. She exhibits no distension. There is no tenderness. no masses Musculoskeletal: Normal range of motion, no joint effusions. No gross deformities Neurological: She is alert and oriented to person, place, and time. No cranial nerve deficit. Coordination normal.  Skin: Skin is warm and dry. No rash noted. No erythema.  Psychiatric: She has a pleasant but mildly dysphoric mood and affect. Her behavior is normal. Judgment and thought content normal.   Lab Results  Component Value Date   WBC 8.9 04/07/2012   HGB 14.1 04/07/2012   HCT 40.3 04/07/2012   PLT 294 04/07/2012   GLUCOSE 89 08/22/2012   CHOL 200 10/08/2011   TRIG 75.0 10/08/2011   HDL 91.90 10/08/2011   LDLCALC 93 10/08/2011   ALT 25 04/07/2012   AST 25 04/07/2012   NA 132* 08/22/2012   K 4.1 08/22/2012   CL 100 08/22/2012   CREATININE 0.7 08/22/2012   BUN 10 08/22/2012   CO2 25 08/22/2012   TSH 0.67 05/19/2012   INR 2.9 09/22/2012   HGBA1C 5.7 04/06/2011       Assessment & Plan:   See problem list. Medications and labs reviewed today. Time spent with pt today 40 minutes, greater than 50% time spent counseling patient on depression, AF, hyponatremia hx and medication review. Also review of prior records

## 2012-09-23 NOTE — Assessment & Plan Note (Signed)
Chronic history of same over past 10-15 years ( predating move to [place]) Prior Endo evaluation unremarkable, normal cortisol Monitor for potential effect of other medications Currently asymptomatic Recheck next visit

## 2012-09-23 NOTE — Assessment & Plan Note (Signed)
Rate controlled on current medications, chronic anticoagulation ongoing ([place]) The current medical regimen is effective;  continue present plan and medications.

## 2012-09-23 NOTE — Assessment & Plan Note (Signed)
The current medical regimen is effective;  continue present plan and medications. Lab Results  Component Value Date   TSH 0.67 05/19/2012

## 2012-09-24 ENCOUNTER — Other Ambulatory Visit: Payer: Self-pay | Admitting: *Deleted

## 2012-09-24 ENCOUNTER — Telehealth: Payer: Self-pay | Admitting: Internal Medicine

## 2012-09-24 DIAGNOSIS — E039 Hypothyroidism, unspecified: Secondary | ICD-10-CM

## 2012-09-24 MED ORDER — WARFARIN SODIUM 2.5 MG PO TABS: 2.5000 mg | ORAL_TABLET | ORAL | Status: DC

## 2012-09-24 MED ORDER — LEVOTHYROXINE SODIUM 50 MCG PO TABS: 50.0000 ug | ORAL_TABLET | Freq: Every day | ORAL | Status: DC

## 2012-09-24 NOTE — Telephone Encounter (Signed)
REFILLS NEEDED ON SYNTHROID 0.05.MG ( 50 MCG ) TABLETS # 90  SIG: TAKE 1 TABLET  PO DAILY  LAST FILLED 03.21.2014

## 2012-09-24 NOTE — Telephone Encounter (Signed)
Rx sent 

## 2012-10-06 ENCOUNTER — Ambulatory Visit (INDEPENDENT_AMBULATORY_CARE_PROVIDER_SITE_OTHER): Payer: BC Managed Care – PPO | Admitting: *Deleted

## 2012-10-06 DIAGNOSIS — I4891 Unspecified atrial fibrillation: Secondary | ICD-10-CM | POA: Diagnosis not present

## 2012-10-06 LAB — POCT INR: INR: 2.1

## 2012-10-07 LAB — BASIC METABOLIC PANEL
BUN: 8 mg/dL (ref 4–21)
Creatinine: 0.6 mg/dL (ref 0.5–1.1)
Glucose: 89 mg/dL
Potassium: 3.9 mmol/L (ref 3.4–5.3)
Sodium: 130 mmol/L — AB (ref 137–147)

## 2012-10-07 LAB — LIPID PANEL
Cholesterol: 197 mg/dL (ref 0–200)
HDL: 88 mg/dL — AB (ref 35–70)
LDL Cholesterol: 93 mg/dL
Triglycerides: 78 mg/dL (ref 40–160)

## 2012-10-07 LAB — HEMOGLOBIN A1C: Hgb A1c MFr Bld: 5.4 % (ref 4.0–6.0)

## 2012-10-07 LAB — CBC AND DIFFERENTIAL
HCT: 41 % (ref 36–46)
Hemoglobin: 13.6 g/dL (ref 12.0–16.0)
Platelets: 314 10*3/uL (ref 150–399)
WBC: 8.7 10^3/mL

## 2012-10-07 LAB — HEPATIC FUNCTION PANEL
ALT: 24 U/L (ref 7–35)
AST: 28 U/L (ref 13–35)
Alkaline Phosphatase: 87 U/L (ref 25–125)
Bilirubin, Total: 0.7 mg/dL

## 2012-10-07 LAB — TSH: TSH: 1.7 u[IU]/mL (ref 0.41–5.90)

## 2012-10-13 ENCOUNTER — Other Ambulatory Visit: Payer: Self-pay | Admitting: Internal Medicine

## 2012-10-20 ENCOUNTER — Encounter: Payer: Self-pay | Admitting: *Deleted

## 2012-10-21 ENCOUNTER — Telehealth: Payer: Self-pay | Admitting: Internal Medicine

## 2012-10-21 DIAGNOSIS — R002 Palpitations: Secondary | ICD-10-CM

## 2012-10-21 NOTE — Telephone Encounter (Signed)
New problem   Calling to get test results

## 2012-10-21 NOTE — Telephone Encounter (Signed)
Left message for pt to call.

## 2012-10-22 NOTE — Telephone Encounter (Signed)
Nuclear results placed in the mail as pt requested. Left message for pt to call as to why she needs a monitor.

## 2012-10-22 NOTE — Telephone Encounter (Signed)
Follow-up:    Patient called in wanting to request the results of her latest Stress Test be mailed to her.  Would also like to wear a 24hr monitor.  Please call back.

## 2012-10-23 NOTE — Telephone Encounter (Signed)
Spoke with pt, she is being seen by a dr Alessandra Bevels. This dr has started her on some supplements for her thyroid, sodium and pituitary gland. She feels a lot better but she cont to have episodes if lightheadedness and a tight feeling in her chest. Dr Alessandra Bevels had mentioned to her that wearing a monitor maybe beneficial if this was related to her heart. These episodes are not everyday and have lessoned greatly but do still occur. She wants to know what dr Graciela Husbands thinks. Aware dr Graciela Husbands is not in the office today and will talk to him tomorrow and call her back. Pt agreed with this plan.

## 2012-10-23 NOTE — Telephone Encounter (Signed)
F/u ° ° °Pt returning your call °

## 2012-10-24 ENCOUNTER — Ambulatory Visit (INDEPENDENT_AMBULATORY_CARE_PROVIDER_SITE_OTHER): Payer: BC Managed Care – PPO

## 2012-10-24 DIAGNOSIS — I4891 Unspecified atrial fibrillation: Secondary | ICD-10-CM

## 2012-10-24 LAB — POCT INR: INR: 3

## 2012-10-24 NOTE — Telephone Encounter (Signed)
Discussed with dr Graciela Husbands, pt aware dr Graciela Husbands thinks an event monitor maybe helpful. Order placed and sent to [facility] to schedule.

## 2012-11-05 ENCOUNTER — Encounter: Payer: Self-pay | Admitting: Internal Medicine

## 2012-11-05 ENCOUNTER — Ambulatory Visit (INDEPENDENT_AMBULATORY_CARE_PROVIDER_SITE_OTHER): Payer: BC Managed Care – PPO | Admitting: Internal Medicine

## 2012-11-05 VITALS — BP 142/70 | HR 78 | Temp 98.4°F | Wt 190.0 lb

## 2012-11-05 DIAGNOSIS — E871 Hypo-osmolality and hyponatremia: Secondary | ICD-10-CM | POA: Diagnosis not present

## 2012-11-05 DIAGNOSIS — F4323 Adjustment disorder with mixed anxiety and depressed mood: Secondary | ICD-10-CM | POA: Diagnosis not present

## 2012-11-05 DIAGNOSIS — E039 Hypothyroidism, unspecified: Secondary | ICD-10-CM | POA: Diagnosis not present

## 2012-11-05 NOTE — Patient Instructions (Signed)
It was good to see you today.  We have reviewed your prior records including labs and tests today  Medications reviewed and updated, no changes recommended at this time.  Please schedule followup in 3-4 months, call sooner if problems.  

## 2012-11-05 NOTE — Assessment & Plan Note (Signed)
Chronic anxiety, and uses lorazepam as needed for same - Started low dose prozac 10 mg daily 09/24/11 -improved - continue same Consider counseling, patient interested in [place] therapy - she will investigate these options and let me know if referral is needed Extensive counseling and support offered today

## 2012-11-05 NOTE — Assessment & Plan Note (Signed)
09/2012 changed to Cytomel by Dr Alessandra Bevels - following there for now rather than endo The current medical regimen is effective;  continue present plan and medications.   Lab Results  Component Value Date   TSH 0.67 05/19/2012

## 2012-11-05 NOTE — Assessment & Plan Note (Signed)
Chronic history of same over past 10-15 years (predating move to [place]) Prior Endo evaluation unremarkable, normal cortisol Monitor for potential effect of other medications, namely SSRI begun 08/2012 Currently asymptomatic Recheck next visit (labs from Dr Justice Rocher 09/2012 reviewed Na=130)

## 2012-11-05 NOTE — Progress Notes (Signed)
  Subjective:    Patient ID: Stephanie Cortez, female    DOB: 1934/03/06, 79 y.o.   MRN: [id number]  HPI Patient presents to the office today for a 6 week follow-up for a med recheck and labs.  Patient started  10 mg of Fluoxetine on 09/23/13 for situational mixed anxiety and depression.  She takes lorazepam as need for same.  Patient states that she is doing well with the medication.  She is feeling considerably better.  Patient found a Librarian, academic and was able to see her twice, and has also been following with Dr. Graciela Husbands.  She feels that it was not effective because she was told to just let somebody else take care of her sister for her.  She doesn't feel like this is acceptable.  She is considering joining a support group called [facility].         Review of Systems  Constitutional: Negative for fever, chills and fatigue.  Respiratory: Negative for chest tightness and shortness of breath.   Cardiovascular: Negative for chest pain.  Psychiatric/Behavioral: Negative for suicidal ideas, self-injury and dysphoric mood. The patient is nervous/anxious.   All other systems reviewed and are negative.       Objective:   Physical Exam  Nursing note and vitals reviewed. Constitutional: She appears well-developed and well-nourished.  HENT:  Head: Normocephalic and atraumatic.  Eyes: Conjunctivae are normal. No scleral icterus.  Pulmonary/Chest: Effort normal and breath sounds normal.  Skin: Skin is warm and dry.  Psychiatric: She has a normal mood and affect. Her behavior is normal.          Assessment & Plan:   Patient here for 6 week follow up of situational depression and axiety.   Patient is currently doing well on 10 mg of Prozac.  We will continue this therapy and have offered a referral if necessary to a group therapy for care givers.  Patient is to come back in 3-4 months for follow-up.  Patient to call sooner if necessary.  I have personally reviewed this case with PA  student. I also personally examined this patient. I agree with history and findings as documented above. I reviewed, discussed and approve of the assessment and plan as listed above. Rene Paci, MD

## 2012-11-05 NOTE — Progress Notes (Signed)
  Subjective:    Patient ID: Stephanie Cortez, female    DOB: 1934-01-09, 79 y.o.   MRN: [id number]  HPI  Here for follow up - reviewed interval events  Past Medical History  Diagnosis Date  . Bronchiectasis     >PFT 07/13/2008 in [place]  Fev 1.9L/76%, FVC 2.45L/74, Ratio 79, TLC 121%, DLCO 64%  AE BRonchiectasis - Dec 2010.New Rx:  outpatient - Feb 2011 - Rx outpatient  . Dyslipidemia   . Eczema   . Anemia     - Hgb 9.7gm% on 07/13/2008 in [place] -  Hgg 129gm% wiht normal irone levsl and ferritin 10/27/2008 in [place] Recurrent otitis/sinusitis  . Atrial fibrillation     chronic anticoag  . Glaucoma(365)   . Back pain 2009  . Anxiety     chronic BZ prn  . Diverticulosis   . COPD (chronic obstructive pulmonary disease)     bronchiectasis  . Hypertension   . Arthritis   . IBS (irritable bowel syndrome)   . H. pylori infection   . GERD with stricture   . Hyponatremia     chronic, s/p endo eval 06/2012  . Hypothyroid   . DDD (degenerative disc disease), lumbar     MRI 08/2012 - working with [facility]    Review of Systems  Respiratory: Negative for cough and shortness of breath.   Cardiovascular: Negative for chest pain or palpitations.      Objective:   Physical Exam  BP 142/70  Pulse 78  Temp(Src) 98.4 F (36.9 C) (Oral)  Wt 190 lb (86.183 kg)  BMI 29.75 kg/m2  SpO2 98% Wt Readings from Last 3 Encounters:  11/05/12 190 lb (86.183 kg)  09/23/12 190 lb 4.8 oz (86.32 kg)  09/22/12 185 lb (83.915 kg)   Constitutional: She appears well-developed and well-nourished. No distress.  Cardiovascular: Normal rate, irregular rhythm and normal heart sounds.  No murmur heard. No BLE edema. Pulmonary/Chest: Effort normal and breath sounds normal. No respiratory distress. She has no wheezes.  Psychiatric: She has a pleasant but mildly anxious mood and affect. Her behavior is normal. Judgment and thought content normal.   Lab Results  Component Value Date   WBC 8.9 04/07/2012   HGB  14.1 04/07/2012   HCT 40.3 04/07/2012   PLT 294 04/07/2012   GLUCOSE 89 08/22/2012   CHOL 200 10/08/2011   TRIG 75.0 10/08/2011   HDL 91.90 10/08/2011   LDLCALC 93 10/08/2011   ALT 25 04/07/2012   AST 25 04/07/2012   NA 132* 08/22/2012   K 4.1 08/22/2012   CL 100 08/22/2012   CREATININE 0.7 08/22/2012   BUN 10 08/22/2012   CO2 25 08/22/2012   TSH 0.67 05/19/2012   INR 3.0 10/24/2012   HGBA1C 5.7 04/06/2011       Assessment & Plan:   See problem list. Medications and labs reviewed today.  Time spent with pt today 25 minutes, greater than 50% time spent counseling patient on anxiety and depression, tyroid, sodium and medication review. Also review of prior records from interval visit with Dr Alessandra Bevels - scanned ito EMR

## 2012-11-06 ENCOUNTER — Telehealth: Payer: Self-pay | Admitting: Gastroenterology

## 2012-11-06 NOTE — Telephone Encounter (Signed)
Advised patient she should come in and discuss her problems with Dr Jarold Motto. She agreed but did not want to come in until June 3rd, 2014 even though I offered  Her next wk tues or Thursday.

## 2012-11-08 ENCOUNTER — Other Ambulatory Visit: Payer: Self-pay | Admitting: Internal Medicine

## 2012-11-10 ENCOUNTER — Ambulatory Visit (INDEPENDENT_AMBULATORY_CARE_PROVIDER_SITE_OTHER): Payer: BC Managed Care – PPO | Admitting: Internal Medicine

## 2012-11-10 ENCOUNTER — Encounter: Payer: Self-pay | Admitting: Internal Medicine

## 2012-11-10 VITALS — BP 122/84 | HR 76 | Temp 98.3°F | Ht 67.0 in | Wt 191.2 lb

## 2012-11-10 DIAGNOSIS — J209 Acute bronchitis, unspecified: Secondary | ICD-10-CM

## 2012-11-10 MED ORDER — DOXYCYCLINE HYCLATE 100 MG PO TABS: 100.0000 mg | ORAL_TABLET | Freq: Two times a day (BID) | ORAL | Status: DC

## 2012-11-10 NOTE — Progress Notes (Signed)
Subjective:    Patient ID: Stephanie Cortez, female    DOB: 1933-08-01, 79 y.o.   MRN: [id number]  HPI 79 yo WF with known hx of Bronchietasis  Follouwp Bronchiectasis. Last ollowup Bronchiectasis. Last seen August 2010, Dec 2010, Jan 2011, FEb 2011 (AE bronciectasis), and  Oct 2011. S/p pulmonary rehab in summer 2011.  Since then had one more visit in Nov 2011 for  AE bronchiectasis. Since then doing well. Denies cough or dyspnea but feels chest is just a bit more tight than usual. . No other specific complaints.  Denies chest pain, dyspnea, orthopnea, hemoptysis, fever, n/v/d, edema, headache.  However, does admit to increased fatigue  + . Uses symbicort as needed which is around 2-3 times per week. Is requesting spirometry  >>rx  symbicort daily    OV 04/12/11:  Using symbicort prn and overall doing well. Only has minimal wheeze. She is anticipatory and pro-acitve in care. When sinus congestion develops uses netti pot and lemon-honey vapors for relief. This prevents wheezing. Typically winter is when she has AE bronchiectasis. Wants to know if she should take prn symbicort or go on daily.  >>no changes   05/23/2011 Acute OV  Complains of head congestion, clear mucus production, PND, chest congestion, cough x1week .  Taking mucinex with some help. Mucus is all clear. No fever or discolored mucus.  Worried that she will be sick over the holidays and she is leaving to go out of town.  She is using netti pot with good relief. She has increased her symbicort Twice daily  With decreased cough.  >>rx doxycycline  11/29 /2012 Follow up  Pt returns for follow up . She is feeling is better. Currently  taking doxycycline.  reports breathing has improved but still having sinuscongestion and drainage. Last ov she was given Augmentin -unfortunately has hx of PCN allergy and abx was changed prior to taking. She is tolerating abx well. Congestion and cough are much better.  No fever or chest pain.  >>No  changes   06/19/2011 Acute OV Complains of wheezing, head congestion with green drainage, hacky cough x3-4days. OTC not helping. Using Netti pot rinses .  No hemotpysis . Worse in am and late at night.  Cough is getting  Worse. Does not want to be sick for the holidays-has too much to do.  Under a lot of stress with sister that is ill. Traveling back and forth to [place].     Past Med: has changed PMD to Dr Laury Axon.  Social: taking care of elder sister in her 68s who has now become dependent Fam hx: as in soccial. No other change   REC Continue on Symbicort 2 puffs Twice daily Until back to your baseline  Mucinex Twice daily As needed Cough/congestion  Fluids and rest  I have given you a prescription for antibiotic--Zpack To have on hold in case  You developed discolored mucus that is not improving.  Saline nasal rinses As needed  Please contact office for sooner follow up if symptoms do not improve or worsen or seek emergency care  follow up Dr. Marchelle Gearing as planned and As needed   OV 08/01/2011  D 23 of 28 biaxin. Blocked sinus. Sinus pressure. Hx of eustachian tube grommets. No resp issues. Netti pot not working - too blocked. Denies fever, sputum, resp exacerbation, wheeze, edema, orthopnea, paroxysmal nocturnal dyspnea    Currently lungs appear ok  Our coordinator will find out who saaw you in [facility]. We will  try to have you seen this week  REturn in 3 months or sooner if needed  Please continue with your medications  OV 10/30/2011  Sinus drainage copious - intolerant to netti pot  - burns, does not want nasal steroids or anti histamine. Wants to go back to ENT. This is making tickle in throat that is severe x 1 week with mucus stuck in throat and gag. CT abdomen lung cut April 2013 for abdominal pain shows clear lung fields with some basal atelectasis. Do not see any basal bronchiectasis  Past, Family, Social reviewed: gi issues, gi scope pending  Currently lungs appear  ok  Our nurse will have you see Dr in [facility] asap for sinus Issues  Take care of your gi issues  REturn in 6 months or sooner if needed  Please continue with your medications  #Overweight  - Victorino Dike will show you the [facility] lipid diet sheet and how to use it  #Followup  - 6 - 9 months or sooner if needed  - spirometry at followup   OV 04/29/2012  Overall ok. Younger sister in [place] died. She is sad. She is having  1 week of increased cough with chest tightness but no sputum or fever. Is asociated with scratchy throat. She feels antibiptics indicated because she wants to make it to funeral. She wants prednisone on standby. No other new issues. Has seen ENT  08/27/2012 Acute OV  Complains of having nasal congestion, wheezing, prod cough at times w green/yellow mucus x2-3 weeks. Has been using netti pot and flonase and symbicort .  No hemoptysis, chest pain , edema or n/v.   Doxycyline 100mg  Twice daily For 7 days  Mucinex DM Twice daily As needed Cough/congestion  Fluids and rest  Saline nasal rinses As needed  Please contact office for sooner follow up if symptoms do not improve or worsen or seek emergency care  follow up Dr. Marchelle Gearing as planned and As needed   OV 11/10/2012  Followup bronchiectasis not otherwise specified.  - Overall doing well. The past few weeks for the allergy season is having increased sinus symptoms. This also corresponds to the fact that she has not taken her Flonase as advised. For the last couple of days she feels like she is getting a bronchitis infection with yellow phlegm. She feels associated fatigue. She is scheduled trip to [place] to see her sister but she feels that she cannot make it and would prefer not to go. Mild increase in wheezing but otherwise no problems. She is compliant with Symbicort. No fever no hemoptysis  Review of Systems  Constitutional: Negative for fever and unexpected weight change.  HENT: Negative for ear pain, nosebleeds,  congestion, sore throat, rhinorrhea, sneezing, trouble swallowing, dental problem, postnasal drip and sinus pressure.   Eyes: Negative for redness and itching.  Respiratory: Negative for cough, chest tightness, shortness of breath and wheezing.   Cardiovascular: Negative for palpitations and leg swelling.  Gastrointestinal: Negative for nausea and vomiting.  Genitourinary: Negative for dysuria.  Musculoskeletal: Negative for joint swelling.  Skin: Negative for rash.  Neurological: Negative for headaches.  Hematological: Does not bruise/bleed easily.  Psychiatric/Behavioral: Negative for dysphoric mood. The patient is not nervous/anxious.        Objective:   Physical Exam GEN: A/Ox3; pleasant , NAD, well nourished   HEENT:  /AT,  EACs-clear, TMs-wnl, NOSE-clear mucus,, THROAT-clear, no lesions, mild post nasal drip present   NECK:  Supple w/ fair ROM; no JVD; normal  carotid impulses w/o bruits; no thyromegaly or nodules palpated; no lymphadenopathy.  RESP  Few rhonchi  w/o, wheezes/ rales no accessory muscle use, no dullness to percussion  CARD:  RRR, no m/r/g  , no peripheral edema, pulses intact, no cyanosis or clubbing.  GI:   Soft & nt; nml bowel sounds; no organomegaly or masses detected.  Musco: Warm bil, no deformities or joint swelling noted.   Neuro: alert, no focal deficits noted.    Skin: Warm, no lesions or rashes           Assessment & Plan:

## 2012-11-10 NOTE — Patient Instructions (Addendum)
You are having some acute bronchitis brought on by allergies and lack of flonase Take doxycycline 100mg  po twice daily x 5 days; take after meals and avoid sunlight Restart flonase as before Take enough rest. No traveling tfor another week REturn in 6 months or so; have flu shot in fall

## 2012-11-10 NOTE — Assessment & Plan Note (Signed)
You are having some acute bronchitis brought on by allergies and lack of flonase Take doxycycline 100mg po twice daily x 5 days; take after meals and avoid sunlight Restart flonase as before Take enough rest. No traveling tfor another week REturn in 6 months or so; have flu shot in fall 

## 2012-11-11 ENCOUNTER — Telehealth: Payer: Self-pay | Admitting: *Deleted

## 2012-11-11 NOTE — Telephone Encounter (Signed)
Pt called stating that she has been placed on Doxycycline 100mg  bid due to Acute Bronchitis and she started the Doxycycline on 11/10/2012 and appt made for her to recheck INR on  Thursday  11/13/2012 and pt states understanding

## 2012-11-13 ENCOUNTER — Ambulatory Visit (INDEPENDENT_AMBULATORY_CARE_PROVIDER_SITE_OTHER): Payer: BC Managed Care – PPO

## 2012-11-13 DIAGNOSIS — I4891 Unspecified atrial fibrillation: Secondary | ICD-10-CM

## 2012-11-13 LAB — POCT INR: INR: 3.1

## 2012-11-30 ENCOUNTER — Other Ambulatory Visit: Payer: Self-pay | Admitting: Family Medicine

## 2012-12-02 ENCOUNTER — Telehealth: Payer: Self-pay

## 2012-12-02 ENCOUNTER — Ambulatory Visit (INDEPENDENT_AMBULATORY_CARE_PROVIDER_SITE_OTHER): Payer: BC Managed Care – PPO | Admitting: Gastroenterology

## 2012-12-02 ENCOUNTER — Encounter: Payer: Self-pay | Admitting: Gastroenterology

## 2012-12-02 VITALS — BP 140/60 | HR 91 | Ht 65.75 in | Wt 189.1 lb

## 2012-12-02 DIAGNOSIS — K219 Gastro-esophageal reflux disease without esophagitis: Secondary | ICD-10-CM | POA: Diagnosis not present

## 2012-12-02 DIAGNOSIS — R1032 Left lower quadrant pain: Secondary | ICD-10-CM

## 2012-12-02 DIAGNOSIS — K5010 Crohn's disease of large intestine without complications: Secondary | ICD-10-CM

## 2012-12-02 DIAGNOSIS — K5730 Diverticulosis of large intestine without perforation or abscess without bleeding: Secondary | ICD-10-CM | POA: Diagnosis not present

## 2012-12-02 DIAGNOSIS — G8929 Other chronic pain: Secondary | ICD-10-CM

## 2012-12-02 MED ORDER — MESALAMINE 1.2 G PO TBEC: 1200.0000 mg | DELAYED_RELEASE_TABLET | Freq: Every day | ORAL | Status: DC

## 2012-12-02 NOTE — Patient Instructions (Addendum)
  Fiber Supplement sheet given today for your review. Please purchase Benefiber over the counter and take one tablespoon by mouth two to three times daily. Lialda was renewed, new prescription was sent to your pharmacy.  Lavonna Rua, RN will call you with your breath test appointment      ________________________________________________________________________                                               We are excited to introduce MyChart, a new best-in-class service that provides you online access to important information in your electronic medical record. We want to make it easier for you to view your health information - all in one secure location - when and where you need it. We expect MyChart will enhance the quality of care and service we provide.  When you register for MyChart, you can:    View your test results.    Request appointments and receive appointment reminders via email.    Request medication renewals.    View your medical history, allergies, medications and immunizations.    Communicate with your physician's office through a password-protected site.    Conveniently print information such as your medication lists.  To find out if MyChart is right for you, please talk to a member of our clinical staff today. We will gladly answer your questions about this free health and wellness tool.  If you are age 73 or older and want a member of your family to have access to your record, you must provide written consent by completing a proxy form available at our office. Please speak to our clinical staff about guidelines regarding accounts for patients younger than age 38.  As you activate your MyChart account and need any technical assistance, please call the MyChart technical support line at [phone]) [phone]-CHART [phone]) or email your question to [email or web address]. If you email your question(s), please include your name, a return phone number and the best time to reach  you.  If you have non-urgent health-related questions, you can send a message to our office through MyChart at [email or web address]. If you have a medical emergency, call 911.  Thank you for using MyChart as your new health and wellness resource!   MyChart licensed from [employer],  2841-3244. Patents Pending.

## 2012-12-02 NOTE — Progress Notes (Signed)
This is a complex 79 year old Caucasian female with chronic vertigo and recurrent hyponatremia.  She has severe diverticulosis with SCAD , segmental colitis associated with diverticulosis.  She is on Lialda 2.4 g a day and has good control of her abdominal pain and diarrhea but occasionally has some left lower quadrant cramps.  She denies rectal bleeding, upper GI or hepatobiliary complaints.  Patient is concerned that she has H. pylori infection, and desires a breath test for H. pylori.  She is on Prilosec 20 mg a day chronically for acid reflux and multiple other medications listed and reviewed her record.  Current Medications, Allergies, Past Medical History, Past Surgical History, Family History and Social History were reviewed in [employer] record.  ROS: All systems were reviewed and are negative unless otherwise stated in the HPI.          Physical Exam: Blood pressure 140/60, pulse 91 and regular weight 189 pounds with a BMI of 30.76.  I cannot appreciate hepatosplenomegaly, abdominal masses or tenderness.  Bowel sounds are normal.  No status is normal.    Assessment and Plan:SCAD doing well on Lialda 2.4 g a day.  I've asked her to continue a high-fiber diet with Benefiber 1 tablespoon in 2-3 times a day with liberal by mouth fluids, mostly Gatorade and fluids containing electrolytes since she has a problem with dilutionall hyponatremia.  We have scheduled H. pylori breath test at her request.  Apparently she had previous positive serum antibody, but was unable to complete antibiotic therapy several years ago.  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  Abdominal CT scan in April 2013 was unremarkable. No diagnosis found.

## 2012-12-02 NOTE — Telephone Encounter (Signed)
I have left a message for the patient to call back to schedule a urea breath test

## 2012-12-02 NOTE — Telephone Encounter (Signed)
Message copied by Annett Fabian on Tue Dec 02, 2012  3:40 PM ------      Message from: Ok Anis A      Created: Tue Dec 02, 2012 12:23 PM       Need breath test scheduled please  ------

## 2012-12-03 NOTE — Telephone Encounter (Signed)
Patient is scheduled for urea breath test for 12/29/12 8:30.  She is aware that I will be mailing her instructions and to stop her Prilosec on 11/14/12

## 2012-12-04 ENCOUNTER — Other Ambulatory Visit: Payer: Self-pay | Admitting: Internal Medicine

## 2012-12-04 ENCOUNTER — Telehealth: Payer: Self-pay | Admitting: Internal Medicine

## 2012-12-04 MED ORDER — DOXYCYCLINE MONOHYDRATE 100 MG PO TABS: 100.0000 mg | ORAL_TABLET | Freq: Two times a day (BID) | ORAL | Status: DC

## 2012-12-04 NOTE — Telephone Encounter (Signed)
Spoke with pt and notified of recs per CDY She verbalized understanding  Rx was sent to pharm  

## 2012-12-04 NOTE — Telephone Encounter (Signed)
I spoke with Stephanie Cortez. She c/o cough w. Yellow phlem, congestion, drainage, wheezing at night. Per Stephanie Cortez this has eben going on x 1 month. She is wanting a refill on doxy. Please advise Dr. Maple Hudson as MR is scheduled off.  11/10/12 last OV  01/28/13 pending  Allergies  Allergen Reactions  . Biaxin (Clarithromycin) Nausea And Vomiting  . Flagyl (Metronidazole) Nausea And Vomiting  . Moxifloxacin     REACTION: weakness  . Pantoprazole Sodium     REACTION: rash  . Penicillins     REACTION: rash

## 2012-12-04 NOTE — Telephone Encounter (Signed)
Per CY-okay to give refill on Doxycycline.

## 2012-12-08 DIAGNOSIS — M48061 Spinal stenosis, lumbar region without neurogenic claudication: Secondary | ICD-10-CM | POA: Diagnosis not present

## 2012-12-08 DIAGNOSIS — IMO0002 Reserved for concepts with insufficient information to code with codable children: Secondary | ICD-10-CM | POA: Diagnosis not present

## 2012-12-08 DIAGNOSIS — M4310 Spondylolisthesis, site unspecified: Secondary | ICD-10-CM | POA: Diagnosis not present

## 2012-12-08 DIAGNOSIS — M5126 Other intervertebral disc displacement, lumbar region: Secondary | ICD-10-CM | POA: Diagnosis not present

## 2012-12-10 ENCOUNTER — Other Ambulatory Visit: Payer: Self-pay | Admitting: Neurosurgery

## 2012-12-16 ENCOUNTER — Other Ambulatory Visit: Payer: Self-pay | Admitting: Internal Medicine

## 2012-12-23 ENCOUNTER — Encounter: Payer: Self-pay | Admitting: *Deleted

## 2012-12-23 ENCOUNTER — Ambulatory Visit (INDEPENDENT_AMBULATORY_CARE_PROVIDER_SITE_OTHER): Payer: BC Managed Care – PPO | Admitting: *Deleted

## 2012-12-23 ENCOUNTER — Encounter (INDEPENDENT_AMBULATORY_CARE_PROVIDER_SITE_OTHER): Payer: BC Managed Care – PPO

## 2012-12-23 DIAGNOSIS — I4891 Unspecified atrial fibrillation: Secondary | ICD-10-CM

## 2012-12-23 DIAGNOSIS — R002 Palpitations: Secondary | ICD-10-CM

## 2012-12-23 LAB — POCT INR: INR: 2.2

## 2012-12-23 NOTE — Progress Notes (Signed)
Patient ID: Stephanie Cortez, female   DOB: 02/07/1934, 79 y.o.   MRN: [id number] Patient leaving for vacation in [place] 01/12/2013.  E-[employer] verite 21 Day event monitor placed on patient to accommodate plans.

## 2012-12-29 ENCOUNTER — Ambulatory Visit (INDEPENDENT_AMBULATORY_CARE_PROVIDER_SITE_OTHER): Payer: BC Managed Care – PPO | Admitting: Gastroenterology

## 2012-12-29 ENCOUNTER — Telehealth: Payer: Self-pay | Admitting: *Deleted

## 2012-12-29 ENCOUNTER — Telehealth: Payer: Self-pay | Admitting: Nurse Practitioner

## 2012-12-29 DIAGNOSIS — R1013 Epigastric pain: Secondary | ICD-10-CM

## 2012-12-29 DIAGNOSIS — K3189 Other diseases of stomach and duodenum: Secondary | ICD-10-CM | POA: Diagnosis not present

## 2012-12-29 DIAGNOSIS — I4891 Unspecified atrial fibrillation: Secondary | ICD-10-CM

## 2012-12-29 MED ORDER — ATENOLOL 25 MG PO TABS: 25.0000 mg | ORAL_TABLET | Freq: Every day | ORAL | Status: DC

## 2012-12-29 NOTE — Telephone Encounter (Signed)
I received a call this AM from [employer] stating that the pt activated her device and reported a rapid HR followed by a syncopal spell.  [employer] was unable to get in touch with the patient.  I called Stephanie Cortez and she answered the phone.  She reported that she did not intentionally activate her device this AM or have any symptoms.  She says that her monitor frequently alerts her that it needs to be recharged and that she may have accidentally activated it in an attempt to clear an error message.  She also says that she has had some redness associated with the leads and I advised that she call the office upon opening to discuss with our staff and obtain leads for sensitive skin.  She verbalized understanding and was grateful for the call back.

## 2012-12-29 NOTE — Telephone Encounter (Signed)
Follow Up ° ° °Pt returning call from earlier. Please call back. °

## 2012-12-29 NOTE — Telephone Encounter (Signed)
Left message for pt to call, alert sent by ecardio regarding atrial fib with RVR. Reviewed by dr Graciela Husbands, the pt needs to start atenolol 25 mg once daily. She needs a TSH and then a follow up with dr Graciela Husbands in 4 weeks.

## 2012-12-29 NOTE — Progress Notes (Signed)
Patient here for Breath Test. She has not eaten this AM. She has not taken PPI or ABX recently. Patient tolerated procedure well.

## 2012-12-29 NOTE — Telephone Encounter (Signed)
Spoke with pt, aware of dr [employer] recommendations.

## 2012-12-31 ENCOUNTER — Telehealth: Payer: Self-pay | Admitting: *Deleted

## 2012-12-31 ENCOUNTER — Telehealth: Payer: Self-pay | Admitting: Internal Medicine

## 2012-12-31 NOTE — Telephone Encounter (Signed)
New problem    Has questions regarding heart monitor she wearing.

## 2012-12-31 NOTE — Telephone Encounter (Signed)
Returned patient phone call.  Patient stated had problems with cardiac monitor sending signal to [facility] due to cell signal coverage in her residence.  Reviewed  event strips from [facility] and assured patient they were receiving a significant amount of data from her and at hours of the day that would most likely be from her home.  Patient instructed to reapply charged monitor, send an event to [facility] and then call them to see if they received her transmission.

## 2012-12-31 NOTE — Telephone Encounter (Signed)
Spoke with pt, she is having problems with the monitor working in her home. Pt number taken to shelley in the monitor she will call the pt back.

## 2013-01-05 ENCOUNTER — Telehealth: Payer: Self-pay | Admitting: Internal Medicine

## 2013-01-05 NOTE — Telephone Encounter (Signed)
New problem  Pt wants to speak with you regarding her heart monitor.  She said that you can try her cell if you do not reach her on her home phone.

## 2013-01-05 NOTE — Telephone Encounter (Signed)
Spoke with pt, questions regarding whether or not the company is getting any of the strips if she pushes the button. Will look in the computer to find out if they are receiving any strips from her.

## 2013-01-06 ENCOUNTER — Telehealth: Payer: Self-pay | Admitting: *Deleted

## 2013-01-06 DIAGNOSIS — E871 Hypo-osmolality and hyponatremia: Secondary | ICD-10-CM

## 2013-01-06 NOTE — Telephone Encounter (Signed)
Spoke with pt, aware the strips are getting to the company.

## 2013-01-06 NOTE — Telephone Encounter (Signed)
Lab ordered.

## 2013-01-06 NOTE — Telephone Encounter (Signed)
Left msg on vm have a problem with having low sodium. Want to have Metabolic panel check if possible. Leaving going out of town on & wanting to check levels before leaving...lmb

## 2013-01-07 ENCOUNTER — Telehealth: Payer: Self-pay | Admitting: Internal Medicine

## 2013-01-07 ENCOUNTER — Other Ambulatory Visit (INDEPENDENT_AMBULATORY_CARE_PROVIDER_SITE_OTHER): Payer: BC Managed Care – PPO

## 2013-01-07 DIAGNOSIS — E871 Hypo-osmolality and hyponatremia: Secondary | ICD-10-CM | POA: Diagnosis not present

## 2013-01-07 LAB — BASIC METABOLIC PANEL
BUN: 12 mg/dL (ref 6–23)
CO2: 27 mEq/L (ref 19–32)
Calcium: 9.3 mg/dL (ref 8.4–10.5)
Chloride: 103 mEq/L (ref 96–112)
Creatinine, Ser: 0.8 mg/dL (ref 0.4–1.2)
GFR: 76.03 mL/min (ref >60.00)
Glucose, Bld: 108 mg/dL — ABNORMAL HIGH (ref 70–99)
Potassium: 4.6 mEq/L (ref 3.5–5.1)
Sodium: 133 mEq/L — ABNORMAL LOW (ref 135–145)

## 2013-01-07 NOTE — Telephone Encounter (Signed)
Will call once md addresses labs...lmb

## 2013-01-07 NOTE — Telephone Encounter (Signed)
Called pt no answer LMOM md ok lab. Can come in at your convience...lmb

## 2013-01-07 NOTE — Telephone Encounter (Signed)
Patient just had lab work done.  She would like results by Friday if possible b/c she is going out of state.

## 2013-01-09 ENCOUNTER — Encounter: Payer: Self-pay | Admitting: Gastroenterology

## 2013-01-09 ENCOUNTER — Telehealth: Payer: Self-pay | Admitting: *Deleted

## 2013-01-09 NOTE — Telephone Encounter (Signed)
Pt called requesting lab results.  Advised pt of results as per result note. 

## 2013-01-10 ENCOUNTER — Other Ambulatory Visit: Payer: Self-pay | Admitting: Internal Medicine

## 2013-01-12 NOTE — Telephone Encounter (Signed)
Faxed script bck to [facility].../lmb 

## 2013-01-20 ENCOUNTER — Ambulatory Visit (INDEPENDENT_AMBULATORY_CARE_PROVIDER_SITE_OTHER): Payer: BC Managed Care – PPO | Admitting: Pharmacist

## 2013-01-20 DIAGNOSIS — I4891 Unspecified atrial fibrillation: Secondary | ICD-10-CM

## 2013-01-20 LAB — POCT INR: INR: 2.9

## 2013-01-22 ENCOUNTER — Telehealth: Payer: Self-pay | Admitting: *Deleted

## 2013-01-22 NOTE — Telephone Encounter (Signed)
I called the patient with her event monitor results. Per Dr. Graciela Husbands " atrial fib with some RVR... Increase atenolol from 25 mg to 50 mg once daily." Per the patient, she started atenolol 12/29/12. She has noticed more pronounced wheezing and some swelling to her feet on atenolol. She states she has started taking the atenolol 25 mg every other day. On the days she does not take it, she notices her wheezing is improved as well as her edema. She states she would like to be on something different than a beta blocker. She has not tolerated amiodarone and some other cardiac medications in the past, but she doesn't recall what these are. She is due to see Dr. Marchelle Gearing next week. She is due to see Dr. Graciela Husbands on 8/11. I advised I will review with Dr. Graciela Husbands to see if he wants to try something different or continue atenolol until 8/11. We will call her back. She is agreeable.

## 2013-01-23 ENCOUNTER — Other Ambulatory Visit: Payer: Self-pay | Admitting: Internal Medicine

## 2013-01-23 MED ORDER — NEBIVOLOL HCL 2.5 MG PO TABS: 2.5000 mg | ORAL_TABLET | Freq: Every day | ORAL | Status: DC

## 2013-01-23 NOTE — Telephone Encounter (Signed)
Discussed with dr Graciela Husbands, pt instructed to stop atenolol and start  nebivolol 2.5 mg once daily. If she tolerates this med we will increase to 5 mg. Pt voiced understanding.

## 2013-01-26 ENCOUNTER — Telehealth: Payer: Self-pay | Admitting: Internal Medicine

## 2013-01-26 NOTE — Telephone Encounter (Signed)
Medication hold- Coumadin hold per Neurosurgery-signed SK

## 2013-01-27 ENCOUNTER — Telehealth: Payer: Self-pay | Admitting: Gastroenterology

## 2013-01-28 ENCOUNTER — Encounter: Payer: Self-pay | Admitting: Internal Medicine

## 2013-01-28 ENCOUNTER — Ambulatory Visit (INDEPENDENT_AMBULATORY_CARE_PROVIDER_SITE_OTHER): Payer: BC Managed Care – PPO | Admitting: Internal Medicine

## 2013-01-28 ENCOUNTER — Other Ambulatory Visit: Payer: Self-pay | Admitting: Family Medicine

## 2013-01-28 VITALS — BP 132/80 | HR 67 | Temp 97.1°F | Ht 67.0 in | Wt 190.8 lb

## 2013-01-28 DIAGNOSIS — J479 Bronchiectasis, uncomplicated: Secondary | ICD-10-CM | POA: Diagnosis not present

## 2013-01-28 NOTE — Telephone Encounter (Signed)
Informed pt her Breath Test was negative for H. Pylori; pt stated understanding.

## 2013-01-28 NOTE — Patient Instructions (Addendum)
Please have PFT and CT chest Will call with results Will also refer to pulmonary rehab FU in3-4 months

## 2013-01-28 NOTE — Progress Notes (Signed)
Subjective:    Patient ID: Stephanie Cortez, female    DOB: 10-16-1933, 79 y.o.   MRN: [id number]  HPI   FU bronchiectasis - dry on flonase and symbicort. Overall stable but having chronic worsening fatigue and dyspnea and pesistent mild cough. HEart med changed to bystolic but still persistent symptoms. Last PFT 2012. NEver had cT chest since moving from [place] to [place] several years ago  Past, Family, Social reviewed: no change since last visit   -    Review of Systems  Constitutional: Negative for fever and unexpected weight change.  HENT: Negative for ear pain, nosebleeds, congestion, sore throat, rhinorrhea, sneezing, trouble swallowing, dental problem, postnasal drip and sinus pressure.   Eyes: Negative for redness and itching.  Respiratory: Positive for shortness of breath. Negative for cough, chest tightness and wheezing.   Cardiovascular: Positive for leg swelling. Negative for palpitations.  Gastrointestinal: Negative for nausea and vomiting.  Genitourinary: Negative for dysuria.  Musculoskeletal: Negative for joint swelling.  Skin: Negative for rash.  Neurological: Negative for headaches.  Hematological: Does not bruise/bleed easily.  Psychiatric/Behavioral: Negative for dysphoric mood. The patient is not nervous/anxious.    Current outpatient prescriptions:BENICAR 40 MG tablet, TAKE 1 TABLET BY MOUTH DAILY, Disp: 30 tablet, Rfl: 2;  budesonide-formoterol (SYMBICORT) 80-4.5 MCG/ACT inhaler, Inhale 2 puffs into the lungs 2 (two) times daily., Disp: 1 Inhaler, Rfl: 5;  diltiazem (TIAZAC) 300 MG 24 hr capsule, Take 300 mg by mouth daily., Disp: , Rfl: ;  FLUoxetine (PROZAC) 10 MG capsule, Take 1 capsule (10 mg total) by mouth daily., Disp: 30 capsule, Rfl: 3 fluticasone (FLONASE) 50 MCG/ACT nasal spray, Place 2 sprays into the nose daily., Disp: , Rfl: ;  IODINE, KELP, PO, Take 12.5 mg by mouth daily., Disp: , Rfl: ;  levothyroxine (SYNTHROID, LEVOTHROID) 50 MCG tablet, Take  1 tablet (50 mcg total) by mouth daily., Disp: 90 tablet, Rfl: 0;  LORazepam (ATIVAN) 0.5 MG tablet, TAKE 1 TABLET BY MOUTH EVERY 8 HOURS AS NEEDED FOR ANXIETY, Disp: 90 tablet, Rfl: 0 MAGNESIUM PO, With Calcium, Takes 2 tbsp by mouth daily, Disp: , Rfl: ;  meclizine (ANTIVERT) 25 MG tablet, TAKE 1 TABLET BY MOUTH THREE TIMES DAILY AS NEEDED FOR VERTIGO, Disp: 90 tablet, Rfl: 0;  mesalamine (LIALDA) 1.2 G EC tablet, Take 1 tablet (1.2 g total) by mouth daily with breakfast., Disp: 30 tablet, Rfl: 2;  Multiple Vitamin (MULTIVITAMIN) LIQD, Take 15 mLs by mouth daily., Disp: , Rfl:  nebivolol (BYSTOLIC) 2.5 MG tablet, Take 1 tablet (2.5 mg total) by mouth daily., Disp: 30 tablet, Rfl: 12;  omeprazole (PRILOSEC) 20 MG capsule, Take 20 mg by mouth daily.  , Disp: , Rfl: ;  Potassium (POTASSIMIN) 75 MG TABS, Take 1 tablet by mouth daily. 1 tab po qd, Disp: , Rfl: ;  pravastatin (PRAVACHOL) 20 MG tablet, TAKE 1 TABLET BY MOUTH DAILY, Disp: 90 tablet, Rfl: 0 psyllium (METAMUCIL) 58.6 % powder, Take 1 packet by mouth daily., Disp: , Rfl: ;  selenium 50 MCG TABS, Take 50 mcg by mouth daily., Disp: , Rfl: ;  warfarin (COUMADIN) 2.5 MG tablet, Take 1 tablet (2.5 mg total) by mouth as directed., Disp: 30 tablet, Rfl: 3;  Zinc 22.5 MG TABS, Take by mouth daily., Disp: , Rfl:       Objective:   Physical Exam  Vitals reviewed. Constitutional: She is oriented to person, place, and time. She appears well-developed and well-nourished. No distress.  HENT:  Head: Normocephalic and  atraumatic.  Right Ear: External ear normal.  Left Ear: External ear normal.  Mouth/Throat: Oropharynx is clear and moist. No oropharyngeal exudate.  Eyes: Conjunctivae and EOM are normal. Pupils are equal, round, and reactive to light. Right eye exhibits no discharge. Left eye exhibits no discharge. No scleral icterus.  Neck: Normal range of motion. Neck supple. No JVD present. No tracheal deviation present. No thyromegaly present.   Cardiovascular: Normal rate, regular rhythm, normal heart sounds and intact distal pulses.  Exam reveals no gallop and no friction rub.   No murmur heard. Pulmonary/Chest: Effort normal. No respiratory distress. She has no wheezes. She has rales. She exhibits no tenderness.  Abdominal: Soft. Bowel sounds are normal. She exhibits no distension and no mass. There is no tenderness. There is no rebound and no guarding.  Musculoskeletal: Normal range of motion. She exhibits no edema and no tenderness.  Lymphadenopathy:    She has no cervical adenopathy.  Neurological: She is alert and oriented to person, place, and time. She has normal reflexes. No cranial nerve deficit. She exhibits normal muscle tone. Coordination normal.  Skin: Skin is warm and dry. No rash noted. She is not diaphoretic. No erythema. No pallor.  Psychiatric: She has a normal mood and affect. Her behavior is normal. Judgment and thought content normal.          Assessment & Plan:

## 2013-01-29 ENCOUNTER — Ambulatory Visit (INDEPENDENT_AMBULATORY_CARE_PROVIDER_SITE_OTHER): Payer: BC Managed Care – PPO | Admitting: Internal Medicine

## 2013-01-29 ENCOUNTER — Telehealth: Payer: Self-pay | Admitting: *Deleted

## 2013-01-29 DIAGNOSIS — E039 Hypothyroidism, unspecified: Secondary | ICD-10-CM

## 2013-01-29 DIAGNOSIS — J479 Bronchiectasis, uncomplicated: Secondary | ICD-10-CM | POA: Diagnosis not present

## 2013-01-29 LAB — PULMONARY FUNCTION TEST

## 2013-01-29 MED ORDER — LEVOTHYROXINE SODIUM 50 MCG PO TABS: 50.0000 ug | ORAL_TABLET | Freq: Every day | ORAL | Status: DC

## 2013-01-29 NOTE — Progress Notes (Signed)
PFT done today. 

## 2013-01-29 NOTE — Telephone Encounter (Signed)
Refill done.  

## 2013-01-30 ENCOUNTER — Ambulatory Visit (INDEPENDENT_AMBULATORY_CARE_PROVIDER_SITE_OTHER)
Admission: RE | Admit: 2013-01-30 | Discharge: 2013-01-30 | Disposition: A | Discharge status: Home / Self Care | Payer: BC Managed Care – PPO | Source: Ambulatory Visit | Attending: Internal Medicine | Admitting: Internal Medicine

## 2013-01-30 DIAGNOSIS — J479 Bronchiectasis, uncomplicated: Secondary | ICD-10-CM | POA: Diagnosis not present

## 2013-02-03 NOTE — Assessment & Plan Note (Signed)
Stable disease but having chronic porogreesive fatigue and dyspneaa. She has not had PFT or CT chest in several years  PLAN  Please have PFT and CT chest Will call with results Will also refer to pulmonary rehab FU in3-4 months

## 2013-02-08 ENCOUNTER — Telehealth: Payer: Self-pay | Admitting: Internal Medicine

## 2013-02-08 NOTE — Telephone Encounter (Signed)
Let her know  `1. Bronhciectasis - real mild and limited to right upper lobe only. No change in Rx plan  2. Evidence of asthma on CT. No change in Rx  3. Lung nodule seen - LUL 1.6cm. Unclear cause. Needs repeat CT chest in 3 months. If she has questions on this and wishes to talk to me let me know   Dr. Kalman Shan, M.D., [facility].C.P Pulmonary and Critical Care Medicine Staff Physician  [facility] [place] [facility] Pager: [phone], If no answer or between  15:00h - 7:00h: call [phone]  02/08/2013 9:14 PM        *RADIOLOGY REPORT*  Clinical Data: Bronchiectasis. Cough. Evaluate for interstitial  lung disease.  CT CHEST WITHOUT CONTRAST  Technique: Multidetector CT imaging of the chest was performed  following the standard protocol without IV contrast.  Comparison: None.  Findings:  Mediastinum: Heart size is enlarged with dilatation of both the  left and right atria. There is no significant pericardial fluid,  thickening or pericardial calcification. There is atherosclerosis  of the thoracic aorta, the great vessels of the mediastinum and the  coronary arteries, including calcified atherosclerotic plaque in  the left main, left anterior descending and right coronary  arteries. No pathologically enlarged mediastinal or hilar lymph  nodes. Please note that accurate exclusion of hilar adenopathy is  limited on noncontrast CT scans. Esophagus is unremarkable in  appearance.  Lungs/Pleura: Within the right upper lobe there is a focal area of  mild cylindrical bronchiectasis (image 18 of series 5) beyond which  there is an irregular shaped area of ground-glass attenuation,  septal thickening and thickening of the peribronchovascular  interstitium. There is an additional ill-defined area of  peripheral bronchiolectasis and septal thickening in the anterior  aspect of the right upper lobe on images 20 and 21, which is  nonspecific. In the  posterior aspect of the left upper lobe  abutting the major fissure there is a 1.0 x 1.6 cm ground-glass  attenuation nodule with a 4 mm central solid component (image 13 of  series 5 and two respectively). 4 mm ground-glass attenuation  nodule in the left upper lobe (image 22 of series 5). Calcified  granuloma the periphery of the left upper lobe (image 20 of series  5). Throughout the remainder of the lung parenchyma there are no  significant regions of subpleural reticulation, honeycombing,  parenchymal banding or areas of traction bronchiectasis to strongly  suggest an underlying interstitial lung disease. Inspiratory and  expiratory imaging demonstrates a profound mosaic appearance of the  lung parenchyma on the expiratory phase with areas of lucency  interspersed with areas of ground-glass attenuation with  intervening geographic margins, compatible with severe air  trapping, suggesting small airways disease. In addition, during  expiratory phase imaging, the trachea and mainstem bronchi appear  slightly collapsed, which may indicate a mild  tracheobronchomalacia.  Upper Abdomen: Unremarkable.  Musculoskeletal: There are no aggressive appearing lytic or blastic  lesions noted in the visualized portions of the skeleton.  IMPRESSION:  1. While there is one area of mild cylindrical bronchiectasis in  the right upper lobe and an additional area of peripheral  bronchiolectasis in the right upper lobe, as above, these findings  are favored to be indicative of an infectious or inflammatory  process on today's examination, rather than indicative of an  underlying interstitial lung disease. No other imaging findings on  today's study strongly suggest an interstitial  lung disease at this  time.  2. 1.0 x 1.7 cm mixed subsolid and solid nodule in the left upper  lobe, as above. This is nonspecific, and may be part of this same  infectious or inflammatory process. At this time, attention on  a  short term follow-up chest CT in 3 months is recommended to ensure  stability or resolution of this finding. This recommendation  follows the consensus statement: Recommendations for the Management  of Subsolid Pulmonary Nodules Detected at CT: A Statement from the  Fleischner Society as published in Radiology 2013; 266:304-317.  This follow-up examination will also serve to reassess the findings  in the right upper lobe discussed above.  3. Findings suggest mild tracheobronchomalacia. In addition,  there is severe air trapping throughout the lungs bilaterally,  suggesting small airways disease.  4. Atherosclerosis, including left main and two-vessel coronary  artery disease. Assessment for potential risk factor  modification, dietary therapy or pharmacologic therapy may be  warranted, if clinically indicated.  5. Cardiomegaly with biatrial dilatation.  6. Additional findings, as above.  Original Report Authenticated By: Trudie Reed, M.D

## 2013-02-09 ENCOUNTER — Ambulatory Visit (INDEPENDENT_AMBULATORY_CARE_PROVIDER_SITE_OTHER): Payer: BC Managed Care – PPO | Admitting: Internal Medicine

## 2013-02-09 ENCOUNTER — Ambulatory Visit (INDEPENDENT_AMBULATORY_CARE_PROVIDER_SITE_OTHER): Payer: BC Managed Care – PPO | Admitting: *Deleted

## 2013-02-09 ENCOUNTER — Encounter: Payer: Self-pay | Admitting: Internal Medicine

## 2013-02-09 VITALS — BP 134/73 | HR 58 | Ht 66.0 in | Wt 189.0 lb

## 2013-02-09 DIAGNOSIS — I4891 Unspecified atrial fibrillation: Secondary | ICD-10-CM

## 2013-02-09 DIAGNOSIS — R42 Dizziness and giddiness: Secondary | ICD-10-CM | POA: Insufficient documentation

## 2013-02-09 DIAGNOSIS — R609 Edema, unspecified: Secondary | ICD-10-CM | POA: Diagnosis not present

## 2013-02-09 DIAGNOSIS — Z01818 Encounter for other preprocedural examination: Secondary | ICD-10-CM

## 2013-02-09 LAB — POCT INR: INR: 1.5

## 2013-02-09 MED ORDER — PYRIDOSTIGMINE BROMIDE 60 MG PO TABS: 30.0000 mg | ORAL_TABLET | Freq: Two times a day (BID) | ORAL | Status: DC

## 2013-02-09 MED ORDER — WARFARIN SODIUM 2.5 MG PO TABS: 2.5000 mg | ORAL_TABLET | Freq: Every day | ORAL | Status: DC

## 2013-02-09 NOTE — Assessment & Plan Note (Signed)
Permanent. Continue current medications

## 2013-02-09 NOTE — Progress Notes (Signed)
[facility] Patient Care Team: Newt Lukes, MD as PCP - General (Internal Medicine) Duke Salvia, MD (Cardiology) Mardella Layman, MD (Gastroenterology) Kalman Shan, MD (Pulmonary Disease) Romero Belling, MD (Endocrinology) Maeola Harman, MD (Neurosurgery) Sherri Rad, MD (Orthopedic Surgery) Drema Halon, MD (Otolaryngology)   HPI  Stephanie Cortez is a 79 y.o. female Seen in followup for long-standing atrial fibrillation dating back about 10 years. It is felt to be permanent having failed multiple cardioversions and antiarrhythmic drugs.   She was last seen 3/14 when , having been seen in [place] a few weeks before a complaint of heart racing, she was referred back. She had been under great deal of psychosocial stress take care of her sister She did see a Veterinary surgeon. thise unfortunately was not particularly useful.  She saw her PCP and she began Prozac. She is much better and very appreciative.  She's also been having problems of lightheadedness when she stands. For this Dr. Ananias Pilgrim start her on salt added to her water. This has been complicated by edema and ankle discomfort.   She had complaints of chest discomfort lasting one to 2 minutes it may be related to exertion they do not. Is described as a heaviness with radiation to the neck.  . Myoview scanning 3/14 was normal  She also has chronic bronchiectasis and is seen pulmonary   Past Medical History  Diagnosis Date  . Bronchiectasis     >PFT 07/13/2008 in [place]  Fev 1.9L/76%, FVC 2.45L/74, Ratio 79, TLC 121%, DLCO 64%  AE BRonchiectasis - Dec 2010.New Rx:  outpatient - Feb 2011 - Rx outpatient  . Dyslipidemia   . Eczema   . Anemia     - Hgb 9.7gm% on 07/13/2008 in [place] -  Hgg 129gm% wiht normal irone levsl and ferritin 10/27/2008 in [place] Recurrent otitis/sinusitis  . Atrial fibrillation     chronic anticoag  . Glaucoma   . Back pain 2009  . Anxiety     chronic BZ prn  . Diverticulosis   . COPD  (chronic obstructive pulmonary disease)     bronchiectasis  . Hypertension   . Arthritis   . IBS (irritable bowel syndrome)   . H. pylori infection   . GERD with stricture   . Hyponatremia     chronic, s/p endo eval 06/2012  . Hypothyroid   . DDD (degenerative disc disease), lumbar     MRI 08/2012 - working with NSurg    Past Surgical History  Procedure Laterality Date  . Cataract extraction      both  . Foot surgery  03/14/2011    gastroc slide-rt  . Total abdominal hysterectomy    . Colonoscopy    . Breast surgery      br bx  . Esophagoscopy w/ botox injection  12/11/2011    Procedure: ESOPHAGOSCOPY WITH BOTOX INJECTION;  Surgeon: Drema Halon, MD;  Location: [facility];  Service: ENT;  Laterality: N/A;  esophogoscopy with dilation, botox injection    Current Outpatient Prescriptions  Medication Sig Dispense Refill  . BENICAR 40 MG tablet TAKE 1 TABLET BY MOUTH DAILY  30 tablet  2  . budesonide-formoterol (SYMBICORT) 80-4.5 MCG/ACT inhaler Inhale 2 puffs into the lungs 2 (two) times daily.  1 Inhaler  5  . diltiazem (TIAZAC) 300 MG 24 hr capsule Take 300 mg by mouth daily.      [name] Kitchen FLUoxetine (PROZAC) 10 MG capsule Take 1 capsule (10 mg total) by mouth  daily.  30 capsule  3  . fluticasone (FLONASE) 50 MCG/ACT nasal spray Place 2 sprays into the nose daily.      . IODINE, KELP, PO Take 12.5 mg by mouth daily.      [name] Kitchen levothyroxine (SYNTHROID, LEVOTHROID) 50 MCG tablet Take 1 tablet (50 mcg total) by mouth daily.  90 tablet  0  . LORazepam (ATIVAN) 0.5 MG tablet TAKE 1 TABLET BY MOUTH EVERY 8 HOURS AS NEEDED FOR ANXIETY  90 tablet  0  . MAGNESIUM PO With Calcium, Takes 2 tbsp by mouth daily      . meclizine (ANTIVERT) 25 MG tablet TAKE 1 TABLET BY MOUTH THREE TIMES DAILY AS NEEDED FOR VERTIGO  90 tablet  0  . mesalamine (LIALDA) 1.2 G EC tablet Take 1 tablet (1.2 g total) by mouth daily with breakfast.  30 tablet  2  . Multiple Vitamin (MULTIVITAMIN) LIQD  Take 15 mLs by mouth daily.      . nebivolol (BYSTOLIC) 2.5 MG tablet Take 1 tablet (2.5 mg total) by mouth daily.  30 tablet  12  . omeprazole (PRILOSEC) 20 MG capsule Take 20 mg by mouth daily.        . Potassium (POTASSIMIN) 75 MG TABS Take 1 tablet by mouth daily. 1 tab po qd      . pravastatin (PRAVACHOL) 20 MG tablet TAKE 1 TABLET BY MOUTH DAILY  90 tablet  0  . psyllium (METAMUCIL) 58.6 % powder Take 1 packet by mouth daily.      [name] Kitchen selenium 50 MCG TABS Take 50 mcg by mouth daily.      [name] Kitchen warfarin (COUMADIN) 2.5 MG tablet Take 1 tablet (2.5 mg total) by mouth as directed.  30 tablet  3  . Zinc 22.5 MG TABS Take by mouth daily.       No current facility-administered medications for this visit.    Allergies  Allergen Reactions  . Biaxin (Clarithromycin) Nausea And Vomiting  . Flagyl (Metronidazole) Nausea And Vomiting  . Moxifloxacin     REACTION: weakness  . Pantoprazole Sodium     REACTION: rash  . Penicillins     REACTION: rash    Review of Systems negative except from HPI and PMH  Physical Exam BP 134/73  Pulse 58  Ht 5\' 6"  (1.676 m)  Wt 189 lb (85.73 kg)  BMI 30.52 kg/m2 Well developed and well nourished in no acute distress HENT normal E scleral and icterus clear Neck Supple JVP flat; carotids brisk and full Clear to ausculation Irregularly irregular rate and rhythm Soft with active bowel sounds No clubbing cyanosis 1+Edema Alert and oriented, grossly normal motor and sensory function Skin Warm and Dry  ECG demonstrates atrial fibrillation at 58 Intervals-/08/41 axis LVIII  Assessment and  Plan

## 2013-02-09 NOTE — Patient Instructions (Addendum)
Your physician recommends that you schedule a follow-up appointment in: 4 weeks with Dr. Graciela Husbands  Stop Meclizine  Stop salt intake  Start Mestinon 60mg  (take one half tablet twice daily)

## 2013-02-09 NOTE — Patient Instructions (Signed)
Dr Venetia Maxon wants patient off coumadin for 7 days, this would mean she would take her last dose of coumadin 03/11/2013, I have routed note to Dr Graciela Husbands for Cardiac clearance  [phone] coumadin  clinic

## 2013-02-09 NOTE — Assessment & Plan Note (Signed)
Her orthostatic lightheadedness is been treated with salt repletion the discontinuation of her diuretic. I'm not sure that this is the best approach. We have discussed the use of Mestinon the context of her hypertension. We will try this and have her discontinue her salt. We'll also discontinue her meclizine.

## 2013-02-09 NOTE — Assessment & Plan Note (Signed)
As above.

## 2013-02-10 ENCOUNTER — Encounter: Payer: Self-pay | Admitting: Internal Medicine

## 2013-02-10 ENCOUNTER — Ambulatory Visit (INDEPENDENT_AMBULATORY_CARE_PROVIDER_SITE_OTHER): Payer: BC Managed Care – PPO | Admitting: Internal Medicine

## 2013-02-10 ENCOUNTER — Other Ambulatory Visit (INDEPENDENT_AMBULATORY_CARE_PROVIDER_SITE_OTHER): Payer: BC Managed Care – PPO

## 2013-02-10 VITALS — BP 132/72 | HR 64 | Temp 97.8°F | Wt 187.1 lb

## 2013-02-10 DIAGNOSIS — E039 Hypothyroidism, unspecified: Secondary | ICD-10-CM

## 2013-02-10 DIAGNOSIS — F4323 Adjustment disorder with mixed anxiety and depressed mood: Secondary | ICD-10-CM | POA: Diagnosis not present

## 2013-02-10 DIAGNOSIS — I10 Essential (primary) hypertension: Secondary | ICD-10-CM

## 2013-02-10 DIAGNOSIS — R609 Edema, unspecified: Secondary | ICD-10-CM | POA: Diagnosis not present

## 2013-02-10 LAB — BASIC METABOLIC PANEL
BUN: 10 mg/dL (ref 6–23)
CO2: 23 mEq/L (ref 19–32)
Calcium: 9.8 mg/dL (ref 8.4–10.5)
Chloride: 102 mEq/L (ref 96–112)
Creatinine, Ser: 0.8 mg/dL (ref 0.4–1.2)
GFR: 72.77 mL/min (ref >60.00)
Glucose, Bld: 100 mg/dL — ABNORMAL HIGH (ref 70–99)
Potassium: 4.4 mEq/L (ref 3.5–5.1)
Sodium: 134 mEq/L — ABNORMAL LOW (ref 135–145)

## 2013-02-10 LAB — TSH: TSH: 1.62 u[IU]/mL (ref 0.35–5.50)

## 2013-02-10 MED ORDER — LEVOTHYROXINE SODIUM 50 MCG PO TABS: 50.0000 ug | ORAL_TABLET | Freq: Every day | ORAL | Status: DC

## 2013-02-10 MED ORDER — OLMESARTAN MEDOXOMIL 40 MG PO TABS: 40.0000 mg | ORAL_TABLET | Freq: Every day | ORAL | Status: DC

## 2013-02-10 MED ORDER — OLMESARTAN MEDOXOMIL 40 MG PO TABS: ORAL_TABLET | ORAL | Status: DC

## 2013-02-10 MED ORDER — FLUOXETINE HCL 10 MG PO CAPS: 10.0000 mg | ORAL_CAPSULE | Freq: Every day | ORAL | Status: DC

## 2013-02-10 NOTE — Assessment & Plan Note (Signed)
BP Readings from Last 3 Encounters:  02/10/13 132/72  02/09/13 134/73  01/28/13 132/80   The current medical regimen is effective;  continue present plan and medications.

## 2013-02-10 NOTE — Assessment & Plan Note (Signed)
Chronic anxiety, and uses lorazepam as needed for same - Started low dose prozac 10 mg daily 09/24/11 -improved - continue same Consider counseling, patient interested in [place] therapy - she will investigate these options and let me know if referral is needed Extensive counseling and support offered today

## 2013-02-10 NOTE — Assessment & Plan Note (Signed)
As per cards recommendations - exacerbated by treatment of orthostatic hypotension and associated dizziness To begin trial Mestinon, no longer on meclizine and no longer on salt restriction (<24h) Continue same and followup with cardiology or here as planned Labs today to monitor creatinine, electrolytes

## 2013-02-10 NOTE — Patient Instructions (Signed)
It was good to see you today. We have reviewed your prior records including labs and tests today Medications reviewed and updated, no changes recommended at this time. Test(s) ordered today. Your results will be released to MyChart (or called to you) after review, usually within 72hours after test completion. If any changes need to be made, you will be notified at that same time. Please schedule followup in 6 months, call sooner if problems. 

## 2013-02-10 NOTE — Assessment & Plan Note (Signed)
09/2012 changed to Cytomel by Dr Alessandra Bevels -  Now back on traditional levothyroxine Check TSH and advise adjustment as needed The current medical regimen is effective;  continue present plan and medications.   Lab Results  Component Value Date   TSH 1.70 10/07/2012

## 2013-02-10 NOTE — Progress Notes (Signed)
  Subjective:    Patient ID: Stephanie Cortez, female    DOB: 11-01-1933, 79 y.o.   MRN: [id number]  HPI  Here for follow up - reviewed interval events  Past Medical History  Diagnosis Date  . Bronchiectasis     >PFT 07/13/2008 in [place]  Fev 1.9L/76%, FVC 2.45L/74, Ratio 79, TLC 121%, DLCO 64%  AE BRonchiectasis - Dec 2010.New Rx:  outpatient - Feb 2011 - Rx outpatient  . Dyslipidemia   . Eczema   . Anemia     - Hgb 9.7gm% on 07/13/2008 in [place] -  Hgg 129gm% wiht normal irone levsl and ferritin 10/27/2008 in [place] Recurrent otitis/sinusitis  . Atrial fibrillation     chronic anticoag  . Glaucoma   . Back pain 2009  . Anxiety     chronic BZ prn  . Diverticulosis   . COPD (chronic obstructive pulmonary disease)     bronchiectasis  . Hypertension   . Arthritis   . IBS (irritable bowel syndrome)   . H. pylori infection   . GERD with stricture   . Hyponatremia     chronic, s/p endo eval 06/2012  . Hypothyroid   . DDD (degenerative disc disease), lumbar     MRI 08/2012 - working with [facility]    Review of Systems  Respiratory: Negative for cough and shortness of breath.   Cardiovascular: Negative for chest pain or palpitations.      Objective:   Physical Exam  BP 132/72  Pulse 64  Temp(Src) 97.8 F (36.6 C) (Oral)  Wt 187 lb 1.9 oz (84.877 kg)  BMI 30.22 kg/m2  SpO2 98% Wt Readings from Last 3 Encounters:  02/10/13 187 lb 1.9 oz (84.877 kg)  02/09/13 189 lb (85.73 kg)  01/28/13 190 lb 12.8 oz (86.546 kg)   Constitutional: She appears well-developed and well-nourished. No distress.  Cardiovascular: Normal rate, irregular rhythm and normal heart sounds.  No murmur heard. very mild BLE edema at ankles. Pulmonary/Chest: Effort normal and breath sounds normal. No respiratory distress. She has no wheezes.  Psychiatric: She has a pleasant but mildly anxious mood and affect. Her behavior is normal. Judgment and thought content normal.   Lab Results  Component Value Date   WBC 8.7 10/07/2012   HGB 13.6 10/07/2012   HCT 41 10/07/2012   PLT 314 10/07/2012   GLUCOSE 108* 01/07/2013   CHOL 197 10/07/2012   TRIG 78 10/07/2012   HDL 88* 10/07/2012   LDLCALC 93 10/07/2012   ALT 24 10/07/2012   AST 28 10/07/2012   NA 133* 01/07/2013   K 4.6 01/07/2013   CL 103 01/07/2013   CREATININE 0.8 01/07/2013   BUN 12 01/07/2013   CO2 27 01/07/2013   TSH 1.70 10/07/2012   INR 1.5 02/09/2013   HGBA1C 5.4 10/07/2012       Assessment & Plan:   See problem list. Medications and labs reviewed today.  Time spent with pt today 25 minutes, greater than 50% time spent counseling patient on anxiety and depression, thyroid, sodium and medication review. Also review of prior records

## 2013-02-11 ENCOUNTER — Ambulatory Visit: Payer: BC Managed Care – PPO | Admitting: Internal Medicine

## 2013-02-12 ENCOUNTER — Telehealth: Payer: Self-pay | Admitting: Internal Medicine

## 2013-02-12 DIAGNOSIS — Z01818 Encounter for other preprocedural examination: Secondary | ICD-10-CM | POA: Insufficient documentation

## 2013-02-12 NOTE — Telephone Encounter (Signed)
Pt advised. Jennifer Castillo, CMA  

## 2013-02-12 NOTE — Telephone Encounter (Signed)
LMOM x 1 

## 2013-02-12 NOTE — Telephone Encounter (Signed)
Spoke with patient-- Patient requesting results of PFTs done 01/29/13 Patient would also like to know what Dr. Marchelle Gearing thinks about the nodule found in her CT chest-- is this smaller or larger than previous scans, is this worrisome, etc Patient states she feels anxious and would like a little more clarity on this to ease her mind Please advise Dr. Marchelle Gearing, thank you!

## 2013-02-12 NOTE — Assessment & Plan Note (Signed)
Functional status is quite good. She should be an acceptable risk for her surgery. In the absence of a prior stroke, anticoagulation bridging I don't think is necessary.

## 2013-02-12 NOTE — Telephone Encounter (Signed)
The discussion of nodule is best done face to face. So. Have her come in to discuss.  Dr. Kalman Shan, M.D., [facility].C.P Pulmonary and Critical Care Medicine Staff Physician [place] [facility] [place] [facility] Pager: [phone], If no answer or between  15:00h - 7:00h: call [phone]  02/12/2013 9:37 PM

## 2013-02-13 NOTE — Telephone Encounter (Signed)
I spoke with pt and appt made. Nothing further needed

## 2013-02-15 ENCOUNTER — Telehealth: Payer: Self-pay | Admitting: Internal Medicine

## 2013-02-15 NOTE — Telephone Encounter (Signed)
pft 01/29/13 is similar to 2010 0 fvc 1.9L/65%, fev1 1.66L/74%, ratio86/116%, No BD resopnse. TLC 80%, DLCO 72%. Mild restriction. No change sinc 2010  Wil discuss fu visit 03/10/13   Dr. Kalman Shan, M.D., F.C.C.P Pulmonary and Critical Care Medicine Staff Physician [facility] [place] [facility] Pager: [phone], If no answer or between  15:00h - 7:00h: call [phone]  02/15/2013 10:53 AM

## 2013-02-16 ENCOUNTER — Other Ambulatory Visit: Payer: Self-pay | Admitting: Family Medicine

## 2013-02-18 NOTE — Telephone Encounter (Signed)
Pt advised. Jennifer Castillo, CMA  

## 2013-02-24 ENCOUNTER — Other Ambulatory Visit: Payer: Self-pay | Admitting: *Deleted

## 2013-02-24 MED ORDER — LORAZEPAM 0.5 MG PO TABS: 0.5000 mg | ORAL_TABLET | Freq: Three times a day (TID) | ORAL | Status: DC | PRN

## 2013-02-24 NOTE — Telephone Encounter (Signed)
Faxed script bck to [facility].../lmb 

## 2013-02-25 ENCOUNTER — Encounter: Payer: Self-pay | Admitting: Internal Medicine

## 2013-02-26 ENCOUNTER — Other Ambulatory Visit: Payer: Self-pay | Admitting: Internal Medicine

## 2013-02-26 ENCOUNTER — Ambulatory Visit
Admission: RE | Admit: 2013-02-26 | Discharge: 2013-02-26 | Disposition: A | Discharge status: Home / Self Care | Payer: BC Managed Care – PPO | Source: Ambulatory Visit | Attending: Internal Medicine | Admitting: Internal Medicine

## 2013-02-26 DIAGNOSIS — R911 Solitary pulmonary nodule: Secondary | ICD-10-CM

## 2013-03-10 ENCOUNTER — Other Ambulatory Visit (HOSPITAL_COMMUNITY): Payer: BC Managed Care – PPO

## 2013-03-10 ENCOUNTER — Telehealth: Payer: Self-pay | Admitting: *Deleted

## 2013-03-10 ENCOUNTER — Ambulatory Visit (INDEPENDENT_AMBULATORY_CARE_PROVIDER_SITE_OTHER): Payer: BC Managed Care – PPO | Admitting: Internal Medicine

## 2013-03-10 ENCOUNTER — Encounter: Payer: Self-pay | Admitting: Internal Medicine

## 2013-03-10 VITALS — BP 128/82 | HR 51 | Ht 67.0 in | Wt 190.0 lb

## 2013-03-10 DIAGNOSIS — Z23 Encounter for immunization: Secondary | ICD-10-CM | POA: Diagnosis not present

## 2013-03-10 DIAGNOSIS — R0982 Postnasal drip: Secondary | ICD-10-CM

## 2013-03-10 DIAGNOSIS — J479 Bronchiectasis, uncomplicated: Secondary | ICD-10-CM

## 2013-03-10 DIAGNOSIS — R911 Solitary pulmonary nodule: Secondary | ICD-10-CM

## 2013-03-10 DIAGNOSIS — I2510 Atherosclerotic heart disease of native coronary artery without angina pectoris: Secondary | ICD-10-CM

## 2013-03-10 NOTE — Telephone Encounter (Signed)
Message copied by Carmela Hurt on Tue Mar 10, 2013  8:41 AM ------      Message from: Baird Lyons      Created: Tue Mar 10, 2013  8:27 AM      Regarding: RE: clearance for surgery       She does not need Lovenox bridging, per Dr. Graciela Husbands.                  ----- Message -----         From: Deliah Goody, RN         Sent: 02/12/2013   8:15 AM           To: Ninetta Lights, RN      Subject: FW: clearance for surgery                                            ----- Message -----         From: Carmela Hurt, RN         Sent: 02/09/2013  12:03 PM           To: Deliah Goody, RN, Duke Salvia, MD, #      Subject: clearance for surgery                                    She forgot to inform you today:            Ms Excell Seltzer for surgery 9/18 by Dr [place] Blas LAMINECTOMY/DECOMPRESSION MICRODISCECTOMY 3 LEVELS N/A General       L3 L4 L5 S1 Laminectomy             Will she need lovenox bridging?            Thanks, Kim A.              ------

## 2013-03-10 NOTE — Telephone Encounter (Signed)
Forward to coumadin clinic

## 2013-03-10 NOTE — Telephone Encounter (Signed)
Telephoned pt and she states she's not having surgery on 03/19/13 with Dr Venetia Maxon due to her stating she's experiencing lung complications. She will inform us when she reschedule.

## 2013-03-10 NOTE — Progress Notes (Signed)
Subjective:    Patient ID: Stephanie Cortez, female    DOB: 1934/02/29, 79 y.o.   MRN: [id number]  HPI 79 yo WF with known hx of Bronchietasis  Follouwp Bronchiectasis. Last ollowup Bronchiectasis. Last seen August 2010, Dec 2010, Jan 2011, FEb 2011 (AE bronciectasis), and  Oct 2011. S/p pulmonary rehab in summer 2011.  Since then had one more visit in Nov 2011 for  AE bronchiectasis. Since then doing well. Denies cough or dyspnea but feels chest is just a bit more tight than usual. . No other specific complaints.  Denies chest pain, dyspnea, orthopnea, hemoptysis, fever, n/v/d, edema, headache.  However, does admit to increased fatigue  + . Uses symbicort as needed which is around 2-3 times per week. Is requesting spirometry  >>rx  symbicort daily    OV 04/12/11:  Using symbicort prn and overall doing well. Only has minimal wheeze. She is anticipatory and pro-acitve in care. When sinus congestion develops uses netti pot and lemon-honey vapors for relief. This prevents wheezing. Typically winter is when she has AE bronchiectasis. Wants to know if she should take prn symbicort or go on daily.  >>no changes   05/23/2011 Acute OV  Complains of head congestion, clear mucus production, PND, chest congestion, cough x1week .  Taking mucinex with some help. Mucus is all clear. No fever or discolored mucus.  Worried that she will be sick over the holidays and she is leaving to go out of town.  She is using netti pot with good relief. She has increased her symbicort Twice daily  With decreased cough.  >>rx doxycycline  11/29 /2012 Follow up  Pt returns for follow up . She is feeling is better. Currently  taking doxycycline.  reports breathing has improved but still having sinuscongestion and drainage. Last ov she was given Augmentin -unfortunately has hx of PCN allergy and abx was changed prior to taking. She is tolerating abx well. Congestion and cough are much better.  No fever or chest pain.  >>No  changes   06/19/2011 Acute OV Complains of wheezing, head congestion with green drainage, hacky cough x3-4days. OTC not helping. Using Netti pot rinses .  No hemotpysis . Worse in am and late at night.  Cough is getting  Worse. Does not want to be sick for the holidays-has too much to do.  Under a lot of stress with sister that is ill. Traveling back and forth to [place].     Past Med: has changed PMD to Dr Laury Axon.  Social: taking care of elder sister in her 48s who has now become dependent Fam hx: as in soccial. No other change   REC Continue on Symbicort 2 puffs Twice daily Until back to your baseline  Mucinex Twice daily As needed Cough/congestion  Fluids and rest  I have given you a prescription for antibiotic--Zpack To have on hold in case  You developed discolored mucus that is not improving.  Saline nasal rinses As needed  Please contact office for sooner follow up if symptoms do not improve or worsen or seek emergency care  follow up Dr. Marchelle Gearing as planned and As needed   OV 08/01/2011  D 23 of 28 biaxin. Blocked sinus. Sinus pressure. Hx of eustachian tube grommets. No resp issues. Netti pot not working - too blocked. Denies fever, sputum, resp exacerbation, wheeze, edema, orthopnea, paroxysmal nocturnal dyspnea    Currently lungs appear ok  Our coordinator will find out who saaw you in [facility]. We will  try to have you seen this week  REturn in 3 months or sooner if needed  Please continue with your medications  OV 10/30/2011  Sinus drainage copious - intolerant to netti pot  - burns, does not want nasal steroids or anti histamine. Wants to go back to ENT. This is making tickle in throat that is severe x 1 week with mucus stuck in throat and gag. CT abdomen lung cut April 2013 for abdominal pain shows clear lung fields with some basal atelectasis. Do not see any basal bronchiectasis  Past, Family, Social reviewed: gi issues, gi scope pending  Currently lungs appear  ok  Our nurse will have you see Dr in [facility] asap for sinus Issues  Take care of your gi issues  REturn in 6 months or sooner if needed  Please continue with your medications  #Overweight  - Victorino Dike will show you the [facility] lipid diet sheet and how to use it  #Followup  - 6 - 9 months or sooner if needed  - spirometry at followup   OV 04/29/2012  Overall ok. Younger sister in [place] died. She is sad. She is having  1 week of increased cough with chest tightness but no sputum or fever. Is asociated with scratchy throat. She feels antibiptics indicated because she wants to make it to funeral. She wants prednisone on standby. No other new issues. Has seen ENT  08/27/2012 Acute OV  Complains of having nasal congestion, wheezing, prod cough at times w green/yellow mucus x2-3 weeks. Has been using netti pot and flonase and symbicort .  No hemoptysis, chest pain , edema or n/v.   Doxycyline 100mg  Twice daily For 7 days  Mucinex DM Twice daily As needed Cough/congestion  Fluids and rest  Saline nasal rinses As needed  Please contact office for sooner follow up if symptoms do not improve or worsen or seek emergency care  follow up Dr. Marchelle Gearing as planned and As needed   OV 11/10/2012  Followup bronchiectasis not otherwise specified.  - Overall doing well. The past few weeks for the allergy season is having increased sinus symptoms. This also corresponds to the fact that she has not taken her Flonase as advised. For the last couple of days she feels like she is getting a bronchitis infection with yellow phlegm. She feels associated fatigue. She is scheduled trip to [place] to see her sister but she feels that she cannot make it and would prefer not to go. Mild increase in wheezing but otherwise no problems. She is compliant with Symbicort. No fever no hemoptysis   Please have PFT and CT chest  Will call with results  Will also refer to pulmonary rehab  FU in3-4 months   OV 03/10/2013 FU  bronchiectasis. Here to discuss PFT and CT results    Pft 01/29/13 is similar to 2010 : fvc 1.9L/65%, fev1 1.66L/74%, ratio86/116%, No BD resopnse. TLC 80%, DLCO 72%. Mild restriction. No change sinc 2010. HAs Mixed REstriction-Obstruction with mild reduced DLCO    CT chest 01/30/13 1. Bronhciectasis - real mild and limited to right upper lobe only.   2. Evidence of asthma /obstructive lung disease on CT.   - Inspiratory and  expiratory imaging demonstrates a profound mosaic appearance of the lung parenchyma on the expiratory phase with areas of lucency interspersed with areas of ground-glass attenuation with intervening geographic margins, compatible with severe air  trapping, suggesting small airways disease. In addition, during expiratory phase imaging, the trachea and mainstem  bronchi appear slightly collapsed, which may indicate a mild tracheobronchomalacia.     3. Lung nodule seen   - LUL 1 x 1.6cm. GGO with solid component.   - 4 mm ground-glass attenuation  nodule in the left upper lobe (image 22 of series 5)  4. Coronary artery atherosclrosis seen - she denies having had cardiac stress test in past; due to see dr Graciela Husbands 03/12/13 per hx       Review of Systems  Constitutional: Negative for fever and unexpected weight change.  HENT: Negative for ear pain, nosebleeds, congestion, sore throat, rhinorrhea, sneezing, trouble swallowing, dental problem, postnasal drip and sinus pressure.   Eyes: Negative for redness and itching.  Respiratory: Negative for cough, chest tightness, shortness of breath and wheezing.   Cardiovascular: Negative for palpitations and leg swelling.  Gastrointestinal: Negative for nausea and vomiting.  Genitourinary: Negative for dysuria.  Musculoskeletal: Negative for joint swelling.  Skin: Negative for rash.  Neurological: Negative for headaches.  Hematological: Does not bruise/bleed easily.  Psychiatric/Behavioral: Negative for dysphoric mood. The  patient is not nervous/anxious.    Current outpatient prescriptions:budesonide-formoterol (SYMBICORT) 80-4.5 MCG/ACT inhaler, Inhale 2 puffs into the lungs 2 (two) times daily., Disp: 1 Inhaler, Rfl: 5;  diltiazem (TIAZAC) 300 MG 24 hr capsule, Take 300 mg by mouth daily., Disp: , Rfl: ;  FLUoxetine (PROZAC) 10 MG capsule, Take 1 capsule (10 mg total) by mouth daily., Disp: 90 capsule, Rfl: 1 fluticasone (FLONASE) 50 MCG/ACT nasal spray, Place 2 sprays into the nose daily., Disp: , Rfl: ;  IODINE, KELP, PO, Take 12.5 mg by mouth daily., Disp: , Rfl: ;  levothyroxine (SYNTHROID, LEVOTHROID) 50 MCG tablet, Take 1 tablet (50 mcg total) by mouth daily., Disp: 90 tablet, Rfl: 1;  LORazepam (ATIVAN) 0.5 MG tablet, Take 1 tablet (0.5 mg total) by mouth every 8 (eight) hours as needed for anxiety., Disp: 90 tablet, Rfl: 1 MAGNESIUM PO, With Calcium, Takes 2 tbsp by mouth daily, Disp: , Rfl: ;  mesalamine (LIALDA) 1.2 G EC tablet, Take 1 tablet (1.2 g total) by mouth daily with breakfast., Disp: 30 tablet, Rfl: 2;  Multiple Vitamin (MULTIVITAMIN) LIQD, Take 15 mLs by mouth daily., Disp: , Rfl: ;  nebivolol (BYSTOLIC) 2.5 MG tablet, Take 1 tablet (2.5 mg total) by mouth daily., Disp: 30 tablet, Rfl: 12 olmesartan (BENICAR) 40 MG tablet, Take 1 tablet (40 mg total) by mouth daily., Disp: 90 tablet, Rfl: 3;  omeprazole (PRILOSEC) 20 MG capsule, Take 20 mg by mouth daily.  , Disp: , Rfl: ;  Potassium (POTASSIMIN) 75 MG TABS, Take 1 tablet by mouth daily. 1 tab po qd, Disp: , Rfl: ;  pravastatin (PRAVACHOL) 20 MG tablet, TAKE 1 TABLET BY MOUTH DAILY, Disp: 90 tablet, Rfl: 0 psyllium (METAMUCIL) 58.6 % powder, Take 1 packet by mouth daily., Disp: , Rfl: ;  pyridostigmine (MESTINON) 60 MG tablet, Take 0.5 tablets (30 mg total) by mouth 2 (two) times daily., Disp: 30 tablet, Rfl: 12;  selenium 50 MCG TABS, Take 50 mcg by mouth daily., Disp: , Rfl: ;  warfarin (COUMADIN) 2.5 MG tablet, Take 1 tablet (2.5 mg total) by mouth  daily., Disp: 35 tablet, Rfl: 3;  Zinc 22.5 MG TABS, Take by mouth daily., Disp: , Rfl:      Objective:   Physical Exam  Vitals reviewed. Constitutional: She is oriented to person, place, and time. She appears well-developed and well-nourished. No distress.  HENT:  Head: Normocephalic and atraumatic.  Right Ear: External  ear normal.  Left Ear: External ear normal.  Mouth/Throat: Oropharynx is clear and moist. No oropharyngeal exudate.  Eyes: Conjunctivae and EOM are normal. Pupils are equal, round, and reactive to light. Right eye exhibits no discharge. Left eye exhibits no discharge. No scleral icterus.  Neck: Normal range of motion. Neck supple. No JVD present. No tracheal deviation present. No thyromegaly present.  Cardiovascular: Normal rate, regular rhythm, normal heart sounds and intact distal pulses.  Exam reveals no gallop and no friction rub.   No murmur heard. Pulmonary/Chest: Effort normal and breath sounds normal. No respiratory distress. She has no wheezes. She has no rales. She exhibits no tenderness.  Abdominal: Soft. Bowel sounds are normal. She exhibits no distension and no mass. There is no tenderness. There is no rebound and no guarding.  Musculoskeletal: Normal range of motion. She exhibits no edema and no tenderness.  Lymphadenopathy:    She has no cervical adenopathy.  Neurological: She is alert and oriented to person, place, and time. She has normal reflexes. No cranial nerve deficit. She exhibits normal muscle tone. Coordination normal.  Skin: Skin is warm and dry. No rash noted. She is not diaphoretic. No erythema. No pallor.  Psychiatric: She has a normal mood and affect. Her behavior is normal. Judgment and thought content normal.          Assessment & Plan:

## 2013-03-10 NOTE — Patient Instructions (Addendum)
#  Coronary ARtery Atherosclerosis  - please see dr Graciela Husbands and discuss having cardiac stress test  #Bronchiectasis and Obstructive Lung disease  - no evidence of flare; so stop doxycycline  - have flu shot 03/10/2013  - continue symbicort  - try tudorza 1 puff twice daily - take several samples  #Left lung upper lobe nodule 1.6cm   - there is intermediate probability that this is slow growing lung cancer called BAC  - will have [facility] blood test call you and do test; test helpful if negative  - REpeat CT chest wo contrast first week Nov 2014  - might order Oncimmune lab test and PET scan depending on fu scan in Nov 2014  #SInus drainage  - take some sample of nasal steroid  - use 3% hypertonic saline spray - OTC by a company called [employer]  #Followup   - Early November 2014 after CT chest

## 2013-03-11 ENCOUNTER — Encounter: Payer: Self-pay | Admitting: *Deleted

## 2013-03-11 NOTE — Progress Notes (Signed)
Faxed requested to [facility] per Dr. Marchelle Gearing

## 2013-03-12 ENCOUNTER — Ambulatory Visit (INDEPENDENT_AMBULATORY_CARE_PROVIDER_SITE_OTHER): Payer: BC Managed Care – PPO | Admitting: Pharmacist

## 2013-03-12 ENCOUNTER — Encounter: Payer: Self-pay | Admitting: Internal Medicine

## 2013-03-12 ENCOUNTER — Ambulatory Visit (INDEPENDENT_AMBULATORY_CARE_PROVIDER_SITE_OTHER): Payer: BC Managed Care – PPO | Admitting: Internal Medicine

## 2013-03-12 VITALS — BP 152/78 | HR 73 | Ht 67.0 in | Wt 186.4 lb

## 2013-03-12 DIAGNOSIS — I4891 Unspecified atrial fibrillation: Secondary | ICD-10-CM

## 2013-03-12 LAB — POCT INR: INR: 1.6

## 2013-03-12 MED ORDER — NEBIVOLOL HCL 5 MG PO TABS: 5.0000 mg | ORAL_TABLET | Freq: Every day | ORAL | Status: DC

## 2013-03-12 NOTE — Patient Instructions (Addendum)
Your physician wants you to follow-up in: 7 months with Dr. Graciela Husbands.  You will receive a reminder letter in the mail two months in advance. If you don't receive a letter, please call our office to schedule the follow-up appointment.  Your physician has recommended you make the following change in your medication:  1) Stop Diltiazem 2) Increase Nebivolol to 5mg  daily

## 2013-03-12 NOTE — Progress Notes (Signed)
[facility] Patient Care Team: Newt Lukes, MD as PCP - General (Internal Medicine) Duke Salvia, MD (Cardiology) Mardella Layman, MD (Gastroenterology) Kalman Shan, MD (Pulmonary Disease) Romero Belling, MD (Endocrinology) Maeola Harman, MD (Neurosurgery) Sherri Rad, MD (Orthopedic Surgery) Drema Halon, MD (Otolaryngology)   HPI  Stephanie Cortez is a 79 y.o. female Seen in followup for long-standing atrial fibrillation dating back about 10 years. It is felt to be permanent having failed multiple cardioversions and antiarrhythmic drugs.   She was last seen 3/14 when , having been seen in [place] a few weeks before a complaint of heart racing, she was referred back. She had been under great deal of psychosocial stress take care of her sister She did see a Veterinary surgeon. thise unfortunately was not particularly useful.  She saw her PCP and she began Prozac. She is much better and very appreciative.   She was noted on chest x-ray to have cardiomegaly. We went back and looked at a series of x-rays over the years. Cardiomegaly was present in the past; there has been intercurrent Myoview scan 3/14 which demonstrated normal left ventricular function. Intercurrent CT scan demonstrated no pericardial effusion. Her chest CT also demonstrated atrial enlargement and atherosclerosis.  She's been taking multiple supplements prescribed by Dr. Ananias Pilgrim.    12  Past Medical History  Diagnosis Date  . Bronchiectasis     >PFT 07/13/2008 in [place]  Fev 1.9L/76%, FVC 2.45L/74, Ratio 79, TLC 121%, DLCO 64%  AE BRonchiectasis - Dec 2010.New Rx:  outpatient - Feb 2011 - Rx outpatient  . Dyslipidemia   . Eczema   . Anemia     - Hgb 9.7gm% on 07/13/2008 in [place] -  Hgg 129gm% wiht normal irone levsl and ferritin 10/27/2008 in [place] Recurrent otitis/sinusitis  . Atrial fibrillation     chronic anticoag  . Glaucoma   . Back pain 2009  . Anxiety     chronic BZ prn  . Diverticulosis   . COPD  (chronic obstructive pulmonary disease)     bronchiectasis  . Hypertension   . Arthritis   . IBS (irritable bowel syndrome)   . H. pylori infection   . GERD with stricture   . Hyponatremia     chronic, s/p endo eval 06/2012  . Hypothyroid   . DDD (degenerative disc disease), lumbar     MRI 08/2012 - working with [facility]    Past Surgical History  Procedure Laterality Date  . Cataract extraction      both  . Foot surgery  03/14/2011    gastroc slide-rt  . Total abdominal hysterectomy    . Colonoscopy    . Breast surgery      br bx  . Esophagoscopy w/ botox injection  12/11/2011    Procedure: ESOPHAGOSCOPY WITH BOTOX INJECTION;  Surgeon: Drema Halon, MD;  Location: [facility];  Service: ENT;  Laterality: N/A;  esophogoscopy with dilation, botox injection    Current Outpatient Prescriptions  Medication Sig Dispense Refill  . budesonide-formoterol (SYMBICORT) 80-4.5 MCG/ACT inhaler Inhale 2 puffs into the lungs 2 (two) times daily.  1 Inhaler  5  . diltiazem (TIAZAC) 300 MG 24 hr capsule Take 300 mg by mouth daily.      [name] Kitchen FLUoxetine (PROZAC) 10 MG capsule Take 1 capsule (10 mg total) by mouth daily.  90 capsule  1  . fluticasone (FLONASE) 50 MCG/ACT nasal spray Place 2 sprays into the nose daily.      [name] Kitchen  IODINE, KELP, PO Take 12.5 mg by mouth daily.      [name] Kitchen levothyroxine (SYNTHROID, LEVOTHROID) 50 MCG tablet Take 1 tablet (50 mcg total) by mouth daily.  90 tablet  1  . LORazepam (ATIVAN) 0.5 MG tablet Take 1 tablet (0.5 mg total) by mouth every 8 (eight) hours as needed for anxiety.  90 tablet  1  . MAGNESIUM PO With Calcium, Takes 2 tbsp by mouth daily      . mesalamine (LIALDA) 1.2 G EC tablet Take 1 tablet (1.2 g total) by mouth daily with breakfast.  30 tablet  2  . Multiple Vitamin (MULTIVITAMIN) LIQD Take 15 mLs by mouth daily.      . nebivolol (BYSTOLIC) 2.5 MG tablet Take 1 tablet (2.5 mg total) by mouth daily.  30 tablet  12  . olmesartan (BENICAR) 40 MG  tablet Take 1 tablet (40 mg total) by mouth daily.  90 tablet  3  . omeprazole (PRILOSEC) 20 MG capsule Take 20 mg by mouth daily.        . Potassium (POTASSIMIN) 75 MG TABS Take 1 tablet by mouth daily. 1 tab po qd      . pravastatin (PRAVACHOL) 20 MG tablet TAKE 1 TABLET BY MOUTH DAILY  90 tablet  0  . pyridostigmine (MESTINON) 60 MG tablet Take 0.5 tablets (30 mg total) by mouth 2 (two) times daily.  30 tablet  12  . selenium 50 MCG TABS Take 50 mcg by mouth daily.      [name] Kitchen warfarin (COUMADIN) 2.5 MG tablet Take 1 tablet (2.5 mg total) by mouth daily.  35 tablet  3  . Zinc 22.5 MG TABS Take by mouth daily.      . psyllium (METAMUCIL) 58.6 % powder Take 1 packet by mouth daily.       No current facility-administered medications for this visit.    Allergies  Allergen Reactions  . Biaxin [Clarithromycin] Nausea And Vomiting  . Flagyl [Metronidazole] Nausea And Vomiting  . Moxifloxacin     REACTION: weakness  . Pantoprazole Sodium     REACTION: rash  . Penicillins     REACTION: rash    Review of Systems negative except from HPI and PMH  Physical Exam BP 152/78  Pulse 73  Ht 5\' 7"  (1.702 m)  Wt 186 lb 6.4 oz (84.55 kg)  BMI 29.19 kg/m2 Well developed and well nourished in no acute distress HENT normal E scleral and icterus clear Neck Supple JVP flat; carotids brisk and full Clear to ausculation Irregularly irregular rate and rhythm Soft with active bowel sounds No clubbing cyanosis 1+Edema Alert and oriented, grossly normal motor and sensory function Skin Warm and Dry     Assessment and  Plan  1. Atrial fibrillation permanent. She is on anticoagulation. Rate control is adequate.  2. Orthostatic hypotension . We will continue her on Mestinon.  3. Hypertension moderately elevated. Given her orthostatic intolerance, however, I think a systolic target of 150-160 is reasonable.  4. Peripheral edema.  I wonder whether part of this may not be related to her diltiazem. She  also has been consuming too much sodium. We will back off. We'll also decrease her volume intake. We'll need to keep track of her heart rate coming off of diltiazem as well as her blood pressure.  4. Bronchiectasis stable  5. Anxiety improved. Reassured.

## 2013-03-15 DIAGNOSIS — R911 Solitary pulmonary nodule: Secondary | ICD-10-CM | POA: Insufficient documentation

## 2013-03-15 DIAGNOSIS — I2510 Atherosclerotic heart disease of native coronary artery without angina pectoris: Secondary | ICD-10-CM | POA: Insufficient documentation

## 2013-03-15 DIAGNOSIS — R0982 Postnasal drip: Secondary | ICD-10-CM | POA: Insufficient documentation

## 2013-03-15 NOTE — Assessment & Plan Note (Signed)
  #  Left lung upper lobe nodule 1.6cm   - there is intermediate probability that this is slow growing lung cancer called BAC  - will have [facility] blood test call you and do test; test helpful if negative  - REpeat CT chest wo contrast first week Nov 2014  - might order Oncimmune lab test and PET scan depending on fu scan in Nov 2014

## 2013-03-15 NOTE — Assessment & Plan Note (Signed)
#  Coronary ARtery Atherosclerosis  - please see dr Graciela Husbands and discuss having cardiac stress test

## 2013-03-15 NOTE — Assessment & Plan Note (Signed)
#  Bronchiectasis and Obstructive Lung disease  - no evidence of flare; so stop doxycycline  - have flu shot 03/10/2013  - continue symbicort  - try tudorza 1 puff twice daily - take several samples

## 2013-03-15 NOTE — Assessment & Plan Note (Signed)
#  Bronchiectasis and Obstructive Lung disease  - no evidence of flare; so stop doxycycline  - have flu shot 03/10/2013  - continue symbicort  - try tudorza 1 puff twice daily - take several samples  #Left lung upper lobe nodule 1.6cm   - there is intermediate probability that this is slow growing lung cancer called BAC  - will have [facility] blood test call you and do test; test helpful if negative  - REpeat CT chest wo contrast first week Nov 2014  - might order Oncimmune lab test and PET scan depending on fu scan in Nov 2014  #SInus drainage  - take some sample of nasal steroid  - use 3% hypertonic saline spray - OTC by a company called [employer] [facility]  #Followup   - Early November 2014 after CT chest

## 2013-03-18 ENCOUNTER — Other Ambulatory Visit: Payer: Self-pay | Admitting: Internal Medicine

## 2013-03-19 ENCOUNTER — Inpatient Hospital Stay (HOSPITAL_COMMUNITY): Admission: RE | Admit: 2013-03-19 | Payer: BC Managed Care – PPO | Source: Ambulatory Visit | Admitting: Neurosurgery

## 2013-03-19 ENCOUNTER — Encounter (HOSPITAL_COMMUNITY): Admission: RE | Payer: Self-pay | Source: Ambulatory Visit

## 2013-03-19 ENCOUNTER — Telehealth: Payer: Self-pay | Admitting: Internal Medicine

## 2013-03-19 DIAGNOSIS — M25579 Pain in unspecified ankle and joints of unspecified foot: Secondary | ICD-10-CM | POA: Diagnosis not present

## 2013-03-19 DIAGNOSIS — M24673 Ankylosis, unspecified ankle: Secondary | ICD-10-CM | POA: Diagnosis not present

## 2013-03-19 DIAGNOSIS — IMO0002 Reserved for concepts with insufficient information to code with codable children: Secondary | ICD-10-CM | POA: Diagnosis not present

## 2013-03-19 SURGERY — LUMBAR LAMINECTOMY/DECOMPRESSION MICRODISCECTOMY 3 LEVELS
Anesthesia: General

## 2013-03-19 NOTE — Telephone Encounter (Signed)
I spoke with the pt and she states she has taken 10 doses of tudorza and since starting it she has had an increase in hoarseness. Pt states it is much worse.  She states she swishes and gargles after using all her inhalers but this has not helped. Pt also has not seen any improvement in her breathing, in fact she feels it may be slightly worse since starting tudorza. Please advise. Carron Curie, CMA

## 2013-03-20 NOTE — Telephone Encounter (Signed)
Stop tudorza. Continue symbicort   Dr. Kalman Shan, M.D., [facility].C.P Pulmonary and Critical Care Medicine Staff Physician [facility] Pager: [phone], If no answer or between  15:00h - 7:00h: call [phone]  03/20/2013 9:02 AM

## 2013-03-20 NOTE — Telephone Encounter (Signed)
Pt advised. Jennifer Castillo, CMA  

## 2013-03-26 ENCOUNTER — Ambulatory Visit (INDEPENDENT_AMBULATORY_CARE_PROVIDER_SITE_OTHER): Payer: BC Managed Care – PPO | Admitting: *Deleted

## 2013-03-26 DIAGNOSIS — I4891 Unspecified atrial fibrillation: Secondary | ICD-10-CM | POA: Diagnosis not present

## 2013-03-26 LAB — POCT INR: INR: 1.6

## 2013-04-01 ENCOUNTER — Telehealth: Payer: Self-pay | Admitting: Internal Medicine

## 2013-04-01 NOTE — Telephone Encounter (Signed)
lmomtcb x1---- did not see anything on pt in MR look at

## 2013-04-02 NOTE — Telephone Encounter (Signed)
indi xpresyus test done 03/17/13 is indeterminate. Please tell her this means test was not helpful in sorting out if nodule is cancer or not. If the test was negative it would have helped but indeterminate  Means we do not know any more than before. So, stick to plan. Do CT chest first week nov 2014. IF nodule gone, no problem. IF nodule still there then we will do PET scan - all plan outline in prior OV   Dr. Kalman Shan, M.D., [facility].C.P Pulmonary and Critical Care Medicine Staff Physician [facility] [place] [facility] Pager: [phone], If no answer or between  15:00h - 7:00h: call [phone]  04/02/2013 5:39 PM

## 2013-04-02 NOTE — Telephone Encounter (Signed)
I spoke with becky and she is going to fax this to triage fax. Will await fax

## 2013-04-02 NOTE — Telephone Encounter (Signed)
Faxed receive and giving to MR. Will forward so he is aware

## 2013-04-03 DIAGNOSIS — H04129 Dry eye syndrome of unspecified lacrimal gland: Secondary | ICD-10-CM | POA: Diagnosis not present

## 2013-04-03 DIAGNOSIS — H1045 Other chronic allergic conjunctivitis: Secondary | ICD-10-CM | POA: Diagnosis not present

## 2013-04-03 DIAGNOSIS — Z961 Presence of intraocular lens: Secondary | ICD-10-CM | POA: Diagnosis not present

## 2013-04-03 DIAGNOSIS — H524 Presbyopia: Secondary | ICD-10-CM | POA: Diagnosis not present

## 2013-04-03 DIAGNOSIS — H40019 Open angle with borderline findings, low risk, unspecified eye: Secondary | ICD-10-CM | POA: Diagnosis not present

## 2013-04-03 DIAGNOSIS — H43399 Other vitreous opacities, unspecified eye: Secondary | ICD-10-CM | POA: Diagnosis not present

## 2013-04-03 NOTE — Telephone Encounter (Signed)
Called and spoke with pt about her results per MR.  Pt voiced her understanding and she has pending appt for ct scan on 11/5.  Pt is aware of this appt and that we will follow up with her after the scan.

## 2013-04-06 NOTE — Telephone Encounter (Signed)
I spoke with Rose at CT and advised CT needs to be a Super-D. This has been scheduled. Carron Curie, CMA

## 2013-04-06 NOTE — Telephone Encounter (Signed)
Victorino Dike, please ensure that upcoming CT scan is SUPER-D protocoo   tHanks  Dr. Kalman Shan, M.D., [facility].C.P Pulmonary and Critical Care Medicine Staff Physician [facility] [place]monary and Critical Care Pager: [phone], If no answer or between  15:00h - 7:00h: call [phone]  04/06/2013 8:44 AM

## 2013-04-10 ENCOUNTER — Ambulatory Visit (INDEPENDENT_AMBULATORY_CARE_PROVIDER_SITE_OTHER): Payer: BC Managed Care – PPO | Admitting: General Practice

## 2013-04-10 DIAGNOSIS — I4891 Unspecified atrial fibrillation: Secondary | ICD-10-CM | POA: Diagnosis not present

## 2013-04-10 LAB — POCT INR: INR: 2.5

## 2013-04-14 ENCOUNTER — Telehealth: Payer: Self-pay | Admitting: Internal Medicine

## 2013-04-14 MED ORDER — PREDNISONE 10 MG PO TABS: ORAL_TABLET | ORAL | Status: DC

## 2013-04-14 MED ORDER — CEPHALEXIN 500 MG PO CAPS: 500.0000 mg | ORAL_CAPSULE | Freq: Three times a day (TID) | ORAL | Status: DC

## 2013-04-14 NOTE — Telephone Encounter (Signed)
Called spoke with patient who reports her chest congestion is not much improved since last ov.  Still having cough occasionally producing clear mucus, wheezing, some chest tightness, runny nose and PND.  Pt denies any f/c/s and is using saline nasal spray and has increased her fluid intake.  She recently saw Dr Alessandra Bevels who gave her doxycycline and reports that this did help her symptoms.  Pt is leaving for [facility] tomorrow for a 5hr drive to visit her sister and would like recommendations prior to leaving for this trip.  MR please advise, thank you.  [facility] Last ov 7.30.14 w/ MR  Allergies  Allergen Reactions  . Biaxin [Clarithromycin] Nausea And Vomiting  . Flagyl [Metronidazole] Nausea And Vomiting  . Moxifloxacin     REACTION: weakness  . Pantoprazole Sodium     REACTION: rash  . Penicillins     REACTION: rash

## 2013-04-14 NOTE — Telephone Encounter (Signed)
Definitely do 5 day prednisone starting today  - Please take prednisone 40 mg x1 day, then 30 mg x1 day, then 20 mg x1 day, then 10 mg x1 day, and then 5 mg x1 day and stop  For the trip take  - cephalexin 500mg  tid x 5 days and prednisone Please take prednisone 40 mg x1 day, then 30 mg x1 day, then 20 mg x1 day, then 10 mg x1 day, and then 5 mg x1 day and stop  - this is stand by for futrue or she can always call from there   Dr. Kalman Shan, M.D., [facility].C.P Pulmonary and Critical Care Medicine Staff Physician [facility] Pager: [phone], If no answer or between  15:00h - 7:00h: call [phone]  04/14/2013 3:36 PM

## 2013-04-14 NOTE — Telephone Encounter (Signed)
Pt advised and rx sent. Ivyanna Sibert, CMA  

## 2013-04-28 ENCOUNTER — Ambulatory Visit (INDEPENDENT_AMBULATORY_CARE_PROVIDER_SITE_OTHER): Payer: BC Managed Care – PPO | Admitting: *Deleted

## 2013-04-28 DIAGNOSIS — I4891 Unspecified atrial fibrillation: Secondary | ICD-10-CM | POA: Diagnosis not present

## 2013-04-28 LAB — POCT INR: INR: 3

## 2013-04-30 ENCOUNTER — Telehealth: Payer: Self-pay | Admitting: Internal Medicine

## 2013-04-30 ENCOUNTER — Other Ambulatory Visit: Payer: Self-pay

## 2013-04-30 DIAGNOSIS — Z1231 Encounter for screening mammogram for malignant neoplasm of breast: Secondary | ICD-10-CM

## 2013-04-30 NOTE — Telephone Encounter (Signed)
Pt called in on 04-14-13 and was prescribed prednisone taper and abx script to hold if not better. Pt states she does not feel like she is better so she has started the abx. Pt aware to call if after this course she is not better or anytime if she is getting worse. Carron Curie, CMA

## 2013-05-04 ENCOUNTER — Ambulatory Visit (INDEPENDENT_AMBULATORY_CARE_PROVIDER_SITE_OTHER): Payer: BC Managed Care – PPO | Admitting: Pulmonary Disease

## 2013-05-04 ENCOUNTER — Telehealth: Payer: Self-pay | Admitting: Internal Medicine

## 2013-05-04 ENCOUNTER — Other Ambulatory Visit: Payer: Self-pay | Admitting: Pulmonary Disease

## 2013-05-04 ENCOUNTER — Encounter: Payer: Self-pay | Admitting: Pulmonary Disease

## 2013-05-04 VITALS — BP 164/98 | HR 95 | Temp 98.4°F | Ht 67.0 in | Wt 189.6 lb

## 2013-05-04 DIAGNOSIS — J471 Bronchiectasis with (acute) exacerbation: Secondary | ICD-10-CM

## 2013-05-04 DIAGNOSIS — R911 Solitary pulmonary nodule: Secondary | ICD-10-CM

## 2013-05-04 MED ORDER — CIPROFLOXACIN HCL 500 MG PO TABS: 500.0000 mg | ORAL_TABLET | Freq: Two times a day (BID) | ORAL | Status: DC

## 2013-05-04 MED ORDER — FLUTTER DEVI: Status: DC

## 2013-05-04 MED ORDER — PREDNISONE 10 MG PO TABS: ORAL_TABLET | ORAL | Status: DC

## 2013-05-04 NOTE — Telephone Encounter (Signed)
Error.Stephanie Cortez ° °

## 2013-05-04 NOTE — Assessment & Plan Note (Signed)
The patient is continuing to have large volume of purulent mucus despite a course of Keflex.  I suspect her lower respiratory tract is colonized with a more resistant organism, and we'll therefore treat her with a more broad-spectrum antibiotic.  She is penicillin allergic, and states that Avelox "made her feel funny".  She has never been on Cipro, and we'll therefore treat her with this medication.  Will also give her another course of prednisone since she clearly has lower airway wheezing, and we'll also give her a flutter valve to help with mucociliary clearance.

## 2013-05-04 NOTE — Patient Instructions (Signed)
Will treat with cipro 500mg  one in am and pm for next 7 days Will treat with a 6 day prednisone taper. Use flutter valve twice a day until better, continue with your hydration.  Will postpone your upcoming ct chest for 2-3 weeks.  Please call if you are not improving.

## 2013-05-04 NOTE — Progress Notes (Signed)
  Subjective:    Patient ID: Stephanie Cortez, female    DOB: Feb 16, 1934, 79 y.o.   MRN: [id number]  HPI The patient comes in today for an acute sick visit.  She has known bronchiectasis with associated obstructive lung disease.  She has had recent increase in congestion, cough, and purulent mucus.  A course of prednisone and Keflex were called in, but she continues to have large volume of purulent mucus that is difficult to mobilize.  She has continued to have chest congestion and wheezing.  Surprisingly, she does not feel that her breathing is that much worse.   Review of Systems  Constitutional: Negative for fever and unexpected weight change.  HENT: Positive for congestion ( chest congestion - worse in AM), postnasal drip and sore throat. Negative for dental problem, ear pain, nosebleeds, rhinorrhea, sinus pressure, sneezing and trouble swallowing.   Eyes: Negative for redness and itching.  Respiratory: Positive for cough, shortness of breath and wheezing. Negative for chest tightness.   Cardiovascular: Negative for palpitations and leg swelling.  Gastrointestinal: Negative for nausea and vomiting.  Genitourinary: Negative for dysuria.  Musculoskeletal: Negative for joint swelling.  Skin: Negative for rash.  Neurological: Negative for headaches.  Hematological: Does not bruise/bleed easily.  Psychiatric/Behavioral: Negative for dysphoric mood. The patient is not nervous/anxious.        Objective:   Physical Exam Well-developed female in no acute distress Nose without purulence or discharge noted Oropharynx clear Neck without lymphadenopathy or thyromegaly Chest with diffuse wheezing both lower and upper airway.  Adequate air flow noted. Cardiac exam with regular rate and rhythm Lower extremities with no significant edema, no cyanosis Alert and oriented, moves all 4 extremities.       Assessment & Plan:

## 2013-05-04 NOTE — Addendum Note (Signed)
Addended by: Maisie Fus on: 05/04/2013 12:04 PM   Modules accepted: Orders

## 2013-05-06 ENCOUNTER — Inpatient Hospital Stay: Admission: RE | Admit: 2013-05-06 | Payer: BC Managed Care – PPO | Source: Ambulatory Visit

## 2013-05-11 ENCOUNTER — Other Ambulatory Visit: Payer: Self-pay | Admitting: Internal Medicine

## 2013-05-11 NOTE — Telephone Encounter (Signed)
[facility].../lmb 

## 2013-05-12 ENCOUNTER — Ambulatory Visit (INDEPENDENT_AMBULATORY_CARE_PROVIDER_SITE_OTHER): Payer: BC Managed Care – PPO | Admitting: General Practice

## 2013-05-12 DIAGNOSIS — I4891 Unspecified atrial fibrillation: Secondary | ICD-10-CM

## 2013-05-12 LAB — POCT INR: INR: 4

## 2013-05-13 ENCOUNTER — Encounter: Payer: Self-pay | Admitting: Internal Medicine

## 2013-05-14 ENCOUNTER — Ambulatory Visit: Payer: BC Managed Care – PPO | Admitting: Internal Medicine

## 2013-05-25 ENCOUNTER — Ambulatory Visit (INDEPENDENT_AMBULATORY_CARE_PROVIDER_SITE_OTHER): Payer: BC Managed Care – PPO | Admitting: *Deleted

## 2013-05-25 ENCOUNTER — Ambulatory Visit (INDEPENDENT_AMBULATORY_CARE_PROVIDER_SITE_OTHER)
Admission: RE | Admit: 2013-05-25 | Discharge: 2013-05-25 | Disposition: A | Discharge status: Home / Self Care | Payer: BC Managed Care – PPO | Source: Ambulatory Visit | Attending: Internal Medicine | Admitting: Internal Medicine

## 2013-05-25 DIAGNOSIS — R911 Solitary pulmonary nodule: Secondary | ICD-10-CM | POA: Diagnosis not present

## 2013-05-25 DIAGNOSIS — I4891 Unspecified atrial fibrillation: Secondary | ICD-10-CM

## 2013-05-25 LAB — POCT INR: INR: 3.6

## 2013-05-27 ENCOUNTER — Telehealth: Payer: Self-pay | Admitting: Internal Medicine

## 2013-05-27 ENCOUNTER — Other Ambulatory Visit: Payer: Self-pay | Admitting: *Deleted

## 2013-05-27 MED ORDER — BUDESONIDE-FORMOTEROL FUMARATE 80-4.5 MCG/ACT IN AERO: 2.0000 | INHALATION_SPRAY | Freq: Two times a day (BID) | RESPIRATORY_TRACT | Status: DC

## 2013-05-27 NOTE — Telephone Encounter (Signed)
I called and spoke with Stephanie Cortez. She was calling to thank [facility] for prescribing her the abx and has made her feel so much better. She has never had cipro in the past. She wanted to thank [facility] for taking the time he did with her. I advised will forward to [facility]. FYI.

## 2013-06-02 ENCOUNTER — Other Ambulatory Visit: Payer: Self-pay | Admitting: Internal Medicine

## 2013-06-04 ENCOUNTER — Ambulatory Visit
Admission: RE | Admit: 2013-06-04 | Discharge: 2013-06-04 | Disposition: A | Discharge status: Home / Self Care | Payer: Medicare Other | Source: Ambulatory Visit

## 2013-06-04 DIAGNOSIS — Z1231 Encounter for screening mammogram for malignant neoplasm of breast: Secondary | ICD-10-CM

## 2013-06-05 DIAGNOSIS — E669 Obesity, unspecified: Secondary | ICD-10-CM | POA: Diagnosis not present

## 2013-06-05 DIAGNOSIS — E2749 Other adrenocortical insufficiency: Secondary | ICD-10-CM | POA: Diagnosis not present

## 2013-06-05 DIAGNOSIS — E871 Hypo-osmolality and hyponatremia: Secondary | ICD-10-CM | POA: Diagnosis not present

## 2013-06-05 DIAGNOSIS — I10 Essential (primary) hypertension: Secondary | ICD-10-CM | POA: Diagnosis not present

## 2013-06-05 DIAGNOSIS — E8840 Mitochondrial metabolism disorder, unspecified: Secondary | ICD-10-CM | POA: Diagnosis not present

## 2013-06-08 ENCOUNTER — Encounter: Payer: Self-pay | Admitting: Internal Medicine

## 2013-06-08 ENCOUNTER — Ambulatory Visit (INDEPENDENT_AMBULATORY_CARE_PROVIDER_SITE_OTHER): Payer: BC Managed Care – PPO | Admitting: Internal Medicine

## 2013-06-08 ENCOUNTER — Ambulatory Visit (INDEPENDENT_AMBULATORY_CARE_PROVIDER_SITE_OTHER): Payer: BC Managed Care – PPO | Admitting: Pharmacist

## 2013-06-08 VITALS — BP 140/88 | HR 90 | Ht 67.0 in | Wt 191.4 lb

## 2013-06-08 DIAGNOSIS — J479 Bronchiectasis, uncomplicated: Secondary | ICD-10-CM

## 2013-06-08 DIAGNOSIS — R911 Solitary pulmonary nodule: Secondary | ICD-10-CM

## 2013-06-08 DIAGNOSIS — I4891 Unspecified atrial fibrillation: Secondary | ICD-10-CM | POA: Diagnosis not present

## 2013-06-08 LAB — POCT INR: INR: 4.6

## 2013-06-08 NOTE — Patient Instructions (Signed)
#  Lung nodule with post inflammatory fibrosis - CMA will print copy of NOv  2014 CT report  - resolved nodule but for fibrosis part do CT chest in 5 months  - High Resolution CT chest without contrast on ILD protocol. Only  Dr Melinda Blietz or Dr. Daniel Entrikin to read  #Bronchiectasis  - stable   Continue inhalers 

## 2013-06-08 NOTE — Progress Notes (Signed)
Subjective:    Patient ID: Stephanie Cortez, female    DOB: Sep 21, 1933, 79 y.o.   MRN: [id number]  HPI   79 yo WF with known hx of Bronchietasis  Follouwp Bronchiectasis. Last ollowup Bronchiectasis. Last seen August 2010, Dec 2010, Jan 2011, FEb 2011 (AE bronciectasis), and  Oct 2011. S/p pulmonary rehab in summer 2011.  Since then had one more visit in Nov 2011 for  AE bronchiectasis. Since then doing well. Denies cough or dyspnea but feels chest is just a bit more tight than usual. . No other specific complaints.  Denies chest pain, dyspnea, orthopnea, hemoptysis, fever, n/v/d, edema, headache.  However, does admit to increased fatigue  + . Uses symbicort as needed which is around 2-3 times per week. Is requesting spirometry  >>rx  symbicort daily    OV 04/12/11:  Using symbicort prn and overall doing well. Only has minimal wheeze. She is anticipatory and pro-acitve in care. When sinus congestion develops uses netti pot and lemon-honey vapors for relief. This prevents wheezing. Typically winter is when she has AE bronchiectasis. Wants to know if she should take prn symbicort or go on daily.  >>no changes   05/23/2011 Acute OV  Complains of head congestion, clear mucus production, PND, chest congestion, cough x1week .  Taking mucinex with some help. Mucus is all clear. No fever or discolored mucus.  Worried that she will be sick over the holidays and she is leaving to go out of town.  She is using netti pot with good relief. She has increased her symbicort Twice daily  With decreased cough.  >>rx doxycycline  11/29 /2012 Follow up  Pt returns for follow up . She is feeling is better. Currently  taking doxycycline.  reports breathing has improved but still having sinuscongestion and drainage. Last ov she was given Augmentin -unfortunately has hx of PCN allergy and abx was changed prior to taking. She is tolerating abx well. Congestion and cough are much better.  No fever or chest pain.   >>No changes   06/19/2011 Acute OV Complains of wheezing, head congestion with green drainage, hacky cough x3-4days. OTC not helping. Using Netti pot rinses .  No hemotpysis . Worse in am and late at night.  Cough is getting  Worse. Does not want to be sick for the holidays-has too much to do.  Under a lot of stress with sister that is ill. Traveling back and forth to [place].     Past Med: has changed PMD to Dr Laury Axon.  Social: taking care of elder sister in her 41s who has now become dependent Fam hx: as in soccial. No other change   REC Continue on Symbicort 2 puffs Twice daily Until back to your baseline  Mucinex Twice daily As needed Cough/congestion  Fluids and rest  I have given you a prescription for antibiotic--Zpack To have on hold in case  You developed discolored mucus that is not improving.  Saline nasal rinses As needed  Please contact office for sooner follow up if symptoms do not improve or worsen or seek emergency care  follow up Dr. Marchelle Gearing as planned and As needed   OV 08/01/2011  D 23 of 28 biaxin. Blocked sinus. Sinus pressure. Hx of eustachian tube grommets. No resp issues. Netti pot not working - too blocked. Denies fever, sputum, resp exacerbation, wheeze, edema, orthopnea, paroxysmal nocturnal dyspnea    Currently lungs appear ok  Our coordinator will find out who saaw you in [place]  [facility]. We will try to have you seen this week  REturn in 3 months or sooner if needed  Please continue with your medications  OV 10/30/2011  Sinus drainage copious - intolerant to netti pot  - burns, does not want nasal steroids or anti histamine. Wants to go back to ENT. This is making tickle in throat that is severe x 1 week with mucus stuck in throat and gag. CT abdomen lung cut April 2013 for abdominal pain shows clear lung fields with some basal atelectasis. Do not see any basal bronchiectasis  Past, Family, Social reviewed: gi issues, gi scope pending  Currently lungs  appear ok  Our nurse will have you see Dr in [facility] asap for sinus Issues  Take care of your gi issues  REturn in 6 months or sooner if needed  Please continue with your medications  #Overweight  - Anderson Malta will show you the [facility] lipid diet sheet and how to use it  #Followup  - 6 - 9 months or sooner if needed  - spirometry at followup   OV 04/29/2012  Overall ok. Younger sister in [place] died. She is sad. She is having  1 week of increased cough with chest tightness but no sputum or fever. Is asociated with scratchy throat. She feels antibiptics indicated because she wants to make it to funeral. She wants prednisone on standby. No other new issues. Has seen ENT  08/27/2012 Acute OV  Complains of having nasal congestion, wheezing, prod cough at times w green/yellow mucus x2-3 weeks. Has been using netti pot and flonase and symbicort .  No hemoptysis, chest pain , edema or n/v.   Doxycyline 100mg  Twice daily For 7 days  Mucinex DM Twice daily As needed Cough/congestion  Fluids and rest  Saline nasal rinses As needed  Please contact office for sooner follow up if symptoms do not improve or worsen or seek emergency care  follow up Dr. Chase Caller as planned and As needed   OV 11/10/2012  Followup bronchiectasis not otherwise specified.  - Overall doing well. The past few weeks for the allergy season is having increased sinus symptoms. This also corresponds to the fact that she has not taken her Flonase as advised. For the last couple of days she feels like she is getting a bronchitis infection with yellow phlegm. She feels associated fatigue. She is scheduled trip to [place] to see her sister but she feels that she cannot make it and would prefer not to go. Mild increase in wheezing but otherwise no problems. She is compliant with Symbicort. No fever no hemoptysis   Please have PFT and CT chest  Will call with results  Will also refer to pulmonary rehab  FU in3-4 months   OV  03/10/2013 FU bronchiectasis. Here to discuss PFT and CT results    Pft 01/29/13 is similar to 2010 : fvc 1.9L/65%, fev1 1.66L/74%, ratio86/116%, No BD resopnse. TLC 80%, DLCO 72%. Mild restriction. No change sinc 2010. HAs Mixed REstriction-Obstruction with mild reduced DLCO    CT chest 01/30/13 1. Bronhciectasis - real mild and limited to right upper lobe only.   2. Evidence of asthma /obstructive lung disease on CT.   - Inspiratory and  expiratory imaging demonstrates a profound mosaic appearance of the lung parenchyma on the expiratory phase with areas of lucency interspersed with areas of ground-glass attenuation with intervening geographic margins, compatible with severe air  trapping, suggesting small airways disease. In addition, during expiratory phase imaging, the  trachea and mainstem bronchi appear slightly collapsed, which may indicate a mild tracheobronchomalacia.     3. Lung nodule seen   - LUL 1 x 1.6cm. GGO with solid component.   - 4 mm ground-glass attenuation  nodule in the left upper lobe (image 22 of series 5)  4. Coronary artery atherosclrosis seen - she denies having had cardiac stress test in past; due to see dr Graciela Husbands 03/12/13 per hx  #Coronary ARtery Atherosclerosis  - please see dr Graciela Husbands and discuss having cardiac stress test  #Bronchiectasis and Obstructive Lung disease  - no evidence of flare; so stop doxycycline  - have flu shot 03/10/2013  - continue symbicort  - try tudorza 1 puff twice daily - take several samples  #Left lung upper lobe nodule 1.6cm   - there is intermediate probability that this is slow growing lung cancer called BAC  - will have [facility] blood test call you and do test; test helpful if negative  - REpeat CT chest wo contrast first week Nov 2014  - might order Oncimmune lab test and PET scan depending on fu scan in Nov 2014  #SInus drainage  - take some sample of nasal steroid  - use 3% hypertonic saline spray - OTC by a company  called [employer]  #Followup   - Early November 2014 after CT chest   OV 06/08/2013  Here to discuss results. Overall stable  CT chestr Nov 2014 CT chest COMPARISON: 01/30/2013.  FINDINGS:  No pathologically enlarged mediastinal or axillary lymph nodes.  Hilar regions are difficult to definitively evaluate without IV  contrast but appear grossly unremarkable. Pulmonary arteries are  borderline enlarged. Heart is enlarged. Atherosclerotic  calcification of the arterial vasculature with involvement of the  proximal left coronary artery. No pericardial effusion.  Biapical pleural parenchymal scarring. There is an upper lobe  predominant pattern of subpleural reticulation, patchy ground-glass  and mild architectural distortion. Scattered mild bronchiectasis.  Confluent airspace opacities seen in the upper lobes on the prior  exam appear less prominent but not resolved today (example image  15). Calcified granuloma in the lingula. No pleural fluid. Airway is  unremarkable.  Incidental imaging of the upper abdomen shows no acute findings. A  peripherally calcified splenic artery aneurysm measures 9 mm, as  before. No worrisome lytic or sclerotic lesions. Degenerative  changes are seen in the spine.  IMPRESSION:  1. Mild subpleural reticulation, patchy ground-glass, scattered  bronchiectasis and architectural distortion in the upper lobes,  somewhat evolved from 01/30/2013, but without a discrete pulmonary  nodule. 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  2. Splenic artery aneurysm, stable.  Electronically Signed  By: Leanna Battles M.D.  On: 05/25/2013 17:32    Review of Systems  Constitutional: Negative for fever and unexpected weight change.  HENT: Negative for congestion, dental problem, ear pain, nosebleeds, postnasal drip, rhinorrhea, sinus pressure, sneezing, sore throat and trouble swallowing.   Eyes: Negative for redness and itching.   Respiratory: Negative for cough, chest tightness, shortness of breath and wheezing.   Cardiovascular: Negative for palpitations and leg swelling.  Gastrointestinal: Negative for nausea and vomiting.  Genitourinary: Negative for dysuria.  Musculoskeletal: Negative for joint swelling.  Skin: Negative for rash.  Neurological: Negative for headaches.  Hematological: Does not bruise/bleed easily.  Psychiatric/Behavioral: Negative for dysphoric mood. The patient is not nervous/anxious.        Objective:   Physical Exam  Filed Vitals:   06/08/13 1404  BP:  140/88  Pulse: 90  Height: 5\' 7"  (1.702 m)  Weight: 191 lb 6.4 oz (86.818 kg)  SpO2: 99%   Discussion only visit      Assessment & Plan:

## 2013-06-18 ENCOUNTER — Other Ambulatory Visit: Payer: Self-pay | Admitting: Internal Medicine

## 2013-06-18 NOTE — Telephone Encounter (Signed)
[facility].../lmb 

## 2013-06-19 ENCOUNTER — Ambulatory Visit (INDEPENDENT_AMBULATORY_CARE_PROVIDER_SITE_OTHER): Payer: BC Managed Care – PPO | Admitting: Pharmacist

## 2013-06-19 DIAGNOSIS — I4891 Unspecified atrial fibrillation: Secondary | ICD-10-CM | POA: Diagnosis not present

## 2013-06-19 LAB — POCT INR: INR: 1.6

## 2013-06-19 NOTE — Assessment & Plan Note (Signed)
#  Lung nodule with post inflammatory fibrosis - CMA will print copy of NOv  2014 CT report  - resolved nodule but for fibrosis part do CT chest in 5 months  - High Resolution CT chest without contrast on ILD protocol. Only  Dr Leanna Battles or Dr. Trudie Reed to read  #Bronchiectasis  - stable   Continue inhalers

## 2013-06-29 DIAGNOSIS — J449 Chronic obstructive pulmonary disease, unspecified: Secondary | ICD-10-CM | POA: Diagnosis present

## 2013-06-29 DIAGNOSIS — E871 Hypo-osmolality and hyponatremia: Secondary | ICD-10-CM | POA: Diagnosis not present

## 2013-06-29 DIAGNOSIS — R079 Chest pain, unspecified: Secondary | ICD-10-CM | POA: Diagnosis not present

## 2013-06-29 DIAGNOSIS — I5030 Unspecified diastolic (congestive) heart failure: Secondary | ICD-10-CM | POA: Diagnosis not present

## 2013-06-29 DIAGNOSIS — Z90710 Acquired absence of both cervix and uterus: Secondary | ICD-10-CM | POA: Diagnosis not present

## 2013-06-29 DIAGNOSIS — R072 Precordial pain: Secondary | ICD-10-CM | POA: Diagnosis not present

## 2013-06-29 DIAGNOSIS — J479 Bronchiectasis, uncomplicated: Secondary | ICD-10-CM | POA: Diagnosis present

## 2013-06-29 DIAGNOSIS — Z7901 Long term (current) use of anticoagulants: Secondary | ICD-10-CM | POA: Diagnosis not present

## 2013-06-29 DIAGNOSIS — I5031 Acute diastolic (congestive) heart failure: Secondary | ICD-10-CM | POA: Diagnosis not present

## 2013-06-29 DIAGNOSIS — I509 Heart failure, unspecified: Secondary | ICD-10-CM | POA: Diagnosis not present

## 2013-06-29 DIAGNOSIS — I10 Essential (primary) hypertension: Secondary | ICD-10-CM | POA: Diagnosis not present

## 2013-06-29 DIAGNOSIS — F411 Generalized anxiety disorder: Secondary | ICD-10-CM | POA: Diagnosis present

## 2013-06-29 DIAGNOSIS — Z79899 Other long term (current) drug therapy: Secondary | ICD-10-CM | POA: Diagnosis not present

## 2013-06-29 DIAGNOSIS — I200 Unstable angina: Secondary | ICD-10-CM | POA: Diagnosis not present

## 2013-06-29 DIAGNOSIS — R0602 Shortness of breath: Secondary | ICD-10-CM | POA: Diagnosis not present

## 2013-06-29 DIAGNOSIS — I369 Nonrheumatic tricuspid valve disorder, unspecified: Secondary | ICD-10-CM | POA: Diagnosis not present

## 2013-06-29 DIAGNOSIS — I059 Rheumatic mitral valve disease, unspecified: Secondary | ICD-10-CM | POA: Diagnosis not present

## 2013-06-29 DIAGNOSIS — E039 Hypothyroidism, unspecified: Secondary | ICD-10-CM | POA: Diagnosis not present

## 2013-06-29 DIAGNOSIS — I209 Angina pectoris, unspecified: Secondary | ICD-10-CM | POA: Diagnosis not present

## 2013-06-29 DIAGNOSIS — I4891 Unspecified atrial fibrillation: Secondary | ICD-10-CM | POA: Diagnosis not present

## 2013-07-01 HISTORY — PX: CARDIAC CATHETERIZATION: SHX172

## 2013-07-03 ENCOUNTER — Telehealth: Payer: Self-pay | Admitting: Internal Medicine

## 2013-07-03 NOTE — Telephone Encounter (Signed)
LMTCB

## 2013-07-06 ENCOUNTER — Ambulatory Visit (INDEPENDENT_AMBULATORY_CARE_PROVIDER_SITE_OTHER): Payer: BC Managed Care – PPO | Admitting: Internal Medicine

## 2013-07-06 ENCOUNTER — Encounter: Payer: Self-pay | Admitting: Internal Medicine

## 2013-07-06 VITALS — BP 126/82 | HR 115 | Ht 67.0 in | Wt 187.8 lb

## 2013-07-06 DIAGNOSIS — I5030 Unspecified diastolic (congestive) heart failure: Secondary | ICD-10-CM | POA: Diagnosis not present

## 2013-07-06 DIAGNOSIS — I509 Heart failure, unspecified: Secondary | ICD-10-CM

## 2013-07-06 MED ORDER — AZELASTINE-FLUTICASONE 137-50 MCG/ACT NA SUSP: 2.0000 | Freq: Every day | NASAL | Status: DC

## 2013-07-06 NOTE — Patient Instructions (Signed)
SOunds like you had Diastolic Heart Failure 44/62/86 in [place] This might have been related to medication change, salt intake, high bp Please follow with cardiology office   #FOllowup 4-5 months from now at time of next CT chest which should be on our schedule

## 2013-07-06 NOTE — Progress Notes (Signed)
Subjective:    Patient ID: Stephanie Cortez, female    DOB: Jul 06, 1933, 80 y.o.   MRN: [id number]  HPI  80 yo WF with known hx of Bronchietasis  Follouwp Bronchiectasis. Last ollowup Bronchiectasis. Last seen August 2010, Dec 2010, Jan 2011, FEb 2011 (AE bronciectasis), and  Oct 2011. S/p pulmonary rehab in summer 2011.  Since then had one more visit in Nov 2011 for  AE bronchiectasis. Since then doing well. Denies cough or dyspnea but feels chest is just a bit more tight than usual. . No other specific complaints.  Denies chest pain, dyspnea, orthopnea, hemoptysis, fever, n/v/d, edema, headache.  However, does admit to increased fatigue  + . Uses symbicort as needed which is around 2-3 times per week. Is requesting spirometry  >>rx  symbicort daily    OV 04/12/11:  Using symbicort prn and overall doing well. Only has minimal wheeze. She is anticipatory and pro-acitve in care. When sinus congestion develops uses netti pot and lemon-honey vapors for relief. This prevents wheezing. Typically winter is when she has AE bronchiectasis. Wants to know if she should take prn symbicort or go on daily.  >>no changes   05/23/2011 Acute OV  Complains of head congestion, clear mucus production, PND, chest congestion, cough x1week .  Taking mucinex with some help. Mucus is all clear. No fever or discolored mucus.  Worried that she will be sick over the holidays and she is leaving to go out of town.  She is using netti pot with good relief. She has increased her symbicort Twice daily  With decreased cough.  >>rx doxycycline  11/29 /2012 Follow up  Pt returns for follow up . She is feeling is better. Currently  taking doxycycline.  reports breathing has improved but still having sinuscongestion and drainage. Last ov she was given Augmentin -unfortunately has hx of PCN allergy and abx was changed prior to taking. She is tolerating abx well. Congestion and cough are much better.  No fever or chest pain.   >>No changes   06/19/2011 Acute OV Complains of wheezing, head congestion with green drainage, hacky cough x3-4days. OTC not helping. Using Netti pot rinses .  No hemotpysis . Worse in am and late at night.  Cough is getting  Worse. Does not want to be sick for the holidays-has too much to do.  Under a lot of stress with sister that is ill. Traveling back and forth to [place].     Past Med: has changed PMD to Dr Etter Sjogren.  Social: taking care of elder sister in her 77s who has now become dependent Fam hx: as in soccial. No other change   REC Continue on Symbicort 2 puffs Twice daily Until back to your baseline  Mucinex Twice daily As needed Cough/congestion  Fluids and rest  I have given you a prescription for antibiotic--Zpack To have on hold in case  You developed discolored mucus that is not improving.  Saline nasal rinses As needed  Please contact office for sooner follow up if symptoms do not improve or worsen or seek emergency care  follow up Dr. Chase Caller as planned and As needed   OV 08/01/2011  D 23 of 28 biaxin. Blocked sinus. Sinus pressure. Hx of eustachian tube grommets. No resp issues. Netti pot not working - too blocked. Denies fever, sputum, resp exacerbation, wheeze, edema, orthopnea, paroxysmal nocturnal dyspnea    Currently lungs appear ok  Our coordinator will find out who saaw you in [facility].  We will try to have you seen this week  REturn in 3 months or sooner if needed  Please continue with your medications  OV 10/30/2011  Sinus drainage copious - intolerant to netti pot  - burns, does not want nasal steroids or anti histamine. Wants to go back to ENT. This is making tickle in throat that is severe x 1 week with mucus stuck in throat and gag. CT abdomen lung cut April 2013 for abdominal pain shows clear lung fields with some basal atelectasis. Do not see any basal bronchiectasis  Past, Family, Social reviewed: gi issues, gi scope pending  Currently lungs  appear ok  Our nurse will have you see Dr in [facility] asap for sinus Issues  Take care of your gi issues  REturn in 6 months or sooner if needed  Please continue with your medications  #Overweight  - Anderson Malta will show you the [facility] lipid diet sheet and how to use it  #Followup  - 6 - 9 months or sooner if needed  - spirometry at followup   OV 04/29/2012  Overall ok. Younger sister in [place] died. She is sad. She is having  1 week of increased cough with chest tightness but no sputum or fever. Is asociated with scratchy throat. She feels antibiptics indicated because she wants to make it to funeral. She wants prednisone on standby. No other new issues. Has seen ENT  08/27/2012 Acute OV  Complains of having nasal congestion, wheezing, prod cough at times w green/yellow mucus x2-3 weeks. Has been using netti pot and flonase and symbicort .  No hemoptysis, chest pain , edema or n/v.   Doxycyline 100mg  Twice daily For 7 days  Mucinex DM Twice daily As needed Cough/congestion  Fluids and rest  Saline nasal rinses As needed  Please contact office for sooner follow up if symptoms do not improve or worsen or seek emergency care  follow up Dr. Chase Caller as planned and As needed   OV 11/10/2012  Followup bronchiectasis not otherwise specified.  - Overall doing well. The past few weeks for the allergy season is having increased sinus symptoms. This also corresponds to the fact that she has not taken her Flonase as advised. For the last couple of days she feels like she is getting a bronchitis infection with yellow phlegm. She feels associated fatigue. She is scheduled trip to [place] to see her sister but she feels that she cannot make it and would prefer not to go. Mild increase in wheezing but otherwise no problems. She is compliant with Symbicort. No fever no hemoptysis   Please have PFT and CT chest  Will call with results  Will also refer to pulmonary rehab  FU in3-4 months   OV  03/10/2013 FU bronchiectasis. Here to discuss PFT and CT results    Pft 01/29/13 is similar to 2010 : fvc 1.9L/65%, fev1 1.66L/74%, ratio86/116%, No BD resopnse. TLC 80%, DLCO 72%. Mild restriction. No change sinc 2010. HAs Mixed REstriction-Obstruction with mild reduced DLCO    CT chest 01/30/13 1. Bronhciectasis - real mild and limited to right upper lobe only.   2. Evidence of asthma /obstructive lung disease on CT.   - Inspiratory and  expiratory imaging demonstrates a profound mosaic appearance of the lung parenchyma on the expiratory phase with areas of lucency interspersed with areas of ground-glass attenuation with intervening geographic margins, compatible with severe air  trapping, suggesting small airways disease. In addition, during expiratory phase imaging, the trachea  and mainstem bronchi appear slightly collapsed, which may indicate a mild tracheobronchomalacia.     3. Lung nodule seen   - LUL 1 x 1.6cm. GGO with solid component.   - 4 mm ground-glass attenuation  nodule in the left upper lobe (image 22 of series 5)  4. Coronary artery atherosclrosis seen - she denies having had cardiac stress test in past; due to see dr Caryl Comes 03/12/13 per hx  #Coronary ARtery Atherosclerosis  - please see dr Caryl Comes and discuss having cardiac stress test  #Bronchiectasis and Obstructive Lung disease  - no evidence of flare; so stop doxycycline  - have flu shot 03/10/2013  - continue symbicort  - try tudorza 1 puff twice daily - take several samples  #Left lung upper lobe nodule 1.6cm   - there is intermediate probability that this is slow growing lung cancer called BAC  - will have [facility] blood test call you and do test; test helpful if negative - INDETER<OMATE  - REpeat CT chest wo contrast first week Nov 2014  - might order Oncimmune lab test and PET scan depending on fu scan in Nov 2014  #SInus drainage  - take some sample of nasal steroid  - use 3% hypertonic saline spray -  OTC by a company called [facility]  #Followup   - Early November 2014 after CT chest   OV 06/08/2013  Here to discuss results. Overall stable  CT chestr Nov 2014 CT chest COMPARISON: 01/30/2013.  FINDINGS:  No pathologically enlarged mediastinal or axillary lymph nodes.  Hilar regions are difficult to definitively evaluate without IV  contrast but appear grossly unremarkable. Pulmonary arteries are  borderline enlarged. Heart is enlarged. Atherosclerotic  calcification of the arterial vasculature with involvement of the  proximal left coronary artery. No pericardial effusion.  Biapical pleural parenchymal scarring. There is an upper lobe  predominant pattern of subpleural reticulation, patchy ground-glass  and mild architectural distortion. Scattered mild bronchiectasis.  Confluent airspace opacities seen in the upper lobes on the prior  exam appear less prominent but not resolved today (example image  15). Calcified granuloma in the lingula. No pleural fluid. Airway is  unremarkable.  Incidental imaging of the upper abdomen shows no acute findings. A  peripherally calcified splenic artery aneurysm measures 9 mm, as  before. No worrisome lytic or sclerotic lesions. Degenerative  changes are seen in the spine.  IMPRESSION:  1. Mild subpleural reticulation, patchy ground-glass, scattered  bronchiectasis and architectural distortion in the upper lobes,  somewhat evolved from 01/30/2013, but without a discrete pulmonary  nodule. 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  2. Splenic artery aneurysm, stable.  Electronically Signed  By: Lorin Picket M.D.  On: 05/25/2013 17:32    REC #Lung nodule with post inflammatory fibrosis - CMA will print copy of NOv  2014 CT report  - resolved nodule but for fibrosis part do CT chest in 5 months  - High Resolution CT chest without contrast on ILD protocol. Only  Dr Lorin Picket or Dr. Vinnie Langton to  read  #Bronchiectasis  - stable   Continue inhalers   OV 07/06/2013 - ACUTE VISIT  Apparently 2 months ago Dr Caryl Comes cardiologist changed some of her heart(A Fib ) med. THen over the holidays was in [facility]. On 06/29/13 had acute rise in BP associated wiuth ddyspnea and feeling clammy. REview of her hx suggests normal cath and a diagnosis of DIastolic CHF presumably. Diuresed and she is well now. SHe  is emotpinally upset over whole incident and presents with her daughter for review. SHe denies pumopnary issues. Hs fu with cards next few days  Past, Family, Social reviewed: no change since last visit     Review of Systems  Constitutional: Negative for fever and unexpected weight change.  HENT: Negative for congestion, dental problem, ear pain, nosebleeds, postnasal drip, rhinorrhea, sinus pressure, sneezing, sore throat and trouble swallowing.   Eyes: Negative for redness and itching.  Respiratory: Negative for cough, chest tightness, shortness of breath and wheezing.   Cardiovascular: Negative for palpitations and leg swelling.  Gastrointestinal: Negative for nausea and vomiting.  Genitourinary: Negative for dysuria.  Musculoskeletal: Negative for joint swelling.  Skin: Negative for rash.  Neurological: Negative for headaches.  Hematological: Does not bruise/bleed easily.  Psychiatric/Behavioral: Negative for dysphoric mood. The patient is not nervous/anxious.        Objective:   Physical Exam  Vitals reviewed. Constitutional: She is oriented to person, place, and time. She appears well-developed and well-nourished. No distress.  HENT:  Head: Normocephalic and atraumatic.  Right Ear: External ear normal.  Left Ear: External ear normal.  Mouth/Throat: Oropharynx is clear and moist. No oropharyngeal exudate.  Eyes: Conjunctivae and EOM are normal. Pupils are equal, round, and reactive to light. Right eye exhibits no discharge. Left eye exhibits no discharge. No scleral icterus.  Neck:  Normal range of motion. Neck supple. No JVD present. No tracheal deviation present. No thyromegaly present.  Cardiovascular: Normal rate, regular rhythm, normal heart sounds and intact distal pulses.  Exam reveals no gallop and no friction rub.   No murmur heard. Pulmonary/Chest: Effort normal and breath sounds normal. No respiratory distress. She has no wheezes. She has no rales. She exhibits no tenderness.  Abdominal: Soft. Bowel sounds are normal. She exhibits no distension and no mass. There is no tenderness. There is no rebound and no guarding.  Musculoskeletal: Normal range of motion. She exhibits no edema and no tenderness.  Lymphadenopathy:    She has no cervical adenopathy.  Neurological: She is alert and oriented to person, place, and time. She has normal reflexes. No cranial nerve deficit. She exhibits normal muscle tone. Coordination normal.  Skin: Skin is warm and dry. No rash noted. She is not diaphoretic. No erythema. No pallor.  Psychiatric: She has a normal mood and affect. Her behavior is normal. Judgment and thought content normal.          Assessment & Plan:  SOunds like you had Diastolic Heart Failure [id number] in [place] This might have been related to medication change, salt intake, high bp Please follow with cardiology office   #FOllowup 4-5 months from now at time of next CT chest which should be on our schedule  (> 50% of this 15 min visit spent in face to face counseling)

## 2013-07-06 NOTE — Telephone Encounter (Signed)
lmtcb x2 

## 2013-07-07 ENCOUNTER — Ambulatory Visit (INDEPENDENT_AMBULATORY_CARE_PROVIDER_SITE_OTHER): Payer: BC Managed Care – PPO | Admitting: *Deleted

## 2013-07-07 DIAGNOSIS — I4891 Unspecified atrial fibrillation: Secondary | ICD-10-CM

## 2013-07-07 LAB — POCT INR: INR: 1.8

## 2013-07-07 NOTE — Telephone Encounter (Signed)
Pt was seen in the office 07/06/13

## 2013-07-08 ENCOUNTER — Ambulatory Visit (INDEPENDENT_AMBULATORY_CARE_PROVIDER_SITE_OTHER): Payer: BC Managed Care – PPO | Admitting: Physician Assistant

## 2013-07-08 ENCOUNTER — Inpatient Hospital Stay: Payer: BC Managed Care – PPO | Admitting: Internal Medicine

## 2013-07-08 ENCOUNTER — Encounter: Payer: Self-pay | Admitting: Physician Assistant

## 2013-07-08 VITALS — BP 146/65 | HR 82 | Ht 66.5 in | Wt 186.0 lb

## 2013-07-08 DIAGNOSIS — H40019 Open angle with borderline findings, low risk, unspecified eye: Secondary | ICD-10-CM | POA: Diagnosis not present

## 2013-07-08 DIAGNOSIS — I2510 Atherosclerotic heart disease of native coronary artery without angina pectoris: Secondary | ICD-10-CM | POA: Diagnosis not present

## 2013-07-08 DIAGNOSIS — I509 Heart failure, unspecified: Secondary | ICD-10-CM | POA: Diagnosis not present

## 2013-07-08 DIAGNOSIS — I4891 Unspecified atrial fibrillation: Secondary | ICD-10-CM | POA: Diagnosis not present

## 2013-07-08 DIAGNOSIS — R609 Edema, unspecified: Secondary | ICD-10-CM

## 2013-07-08 DIAGNOSIS — I5030 Unspecified diastolic (congestive) heart failure: Secondary | ICD-10-CM | POA: Insufficient documentation

## 2013-07-08 NOTE — Assessment & Plan Note (Signed)
Edema improved off diltiazem but then she had an admission with heart failure while in [place].

## 2013-07-08 NOTE — Progress Notes (Signed)
HPI: This is a 80 year old female patient of Dr. Jolyn Nap who has a history of chronic atrial fibrillation after failed multiple cardioversions and antiarrhythmic drugs. She also has history of orthostatic hypotension, moderately elevated hypertension and recently peripheral edema for which diltiazem was stopped.   Patient was recently traveling through [place] for the holidays. She was admitted to hospital with chest pain, elevated blood pressure and diagnosed with congestive heart failure and rapid atrial fibrillation. She underwent cardiac catheterization and 2-D echo. She says the cath was okay but we are trying to obtain all these records. She just wants to know what she needs to do to take care herself. She was not sent home on a diuretic. She is on bysystolic 5 mg daily. She denies any recurrent chest pain, palpitations, dyspnea, dyspnea on exertion, dizziness or presyncope.  Allergies -- Biaxin [Clarithromycin] -- Nausea And Vomiting  -- Flagyl [Metronidazole] -- Nausea And Vomiting  -- Moxifloxacin    --  REACTION: weakness  -- Pantoprazole Sodium    --  REACTION: rash  -- Penicillins    --  REACTION: rash  Current Outpatient Prescriptions on File Prior to Visit: Azelastine-Fluticasone (DYMISTA) 137-50 MCG/ACT SUSP, Place 2 sprays into the nose daily., Disp: 23 g, Rfl: 6 budesonide-formoterol (SYMBICORT) 80-4.5 MCG/ACT inhaler, Inhale 2 puffs into the lungs 2 (two) times daily., Disp: 1 Inhaler, Rfl: 5 DHEA 10 MG TABS, Take 5 mg by mouth daily., Disp: , Rfl:  FLUoxetine (PROZAC) 10 MG capsule, Take 1 capsule (10 mg total) by mouth daily., Disp: 90 capsule, Rfl: 1 IODINE, KELP, PO, Take 12.5 mg by mouth daily., Disp: , Rfl:  levothyroxine (SYNTHROID, LEVOTHROID) 50 MCG tablet, Take 1 tablet (50 mcg total) by mouth daily., Disp: 90 tablet, Rfl: 1 LORazepam (ATIVAN) 0.5 MG tablet, TAKE 1 TABLET BY MOUTH EVERY 8 HOURS AS NEEDED FOR ANXIETY, Disp: 90 tablet, Rfl: 0 MAGNESIUM PO, With  Calcium, Takes 2 tbsp by mouth daily, Disp: , Rfl:  mesalamine (LIALDA) 1.2 G EC tablet, Take 1 tablet (1.2 g total) by mouth daily with breakfast., Disp: 30 tablet, Rfl: 2 Multiple Vitamin (MULTIVITAMIN) LIQD, Take 15 mLs by mouth daily., Disp: , Rfl:  nebivolol (BYSTOLIC) 5 MG tablet, Take 1 tablet (5 mg total) by mouth daily., Disp: 30 tablet, Rfl: 12 NON FORMULARY, Take 100 mg by mouth daily. AnnatooTocotrienols, Disp: , Rfl:  olmesartan (BENICAR) 40 MG tablet, Take 1 tablet (40 mg total) by mouth daily., Disp: 90 tablet, Rfl: 3 omeprazole (PRILOSEC) 20 MG capsule, Take 20 mg by mouth daily.  , Disp: , Rfl:  Potassium (POTASSIMIN) 75 MG TABS, Take 1 tablet by mouth daily. 1 tab po qd, Disp: , Rfl:  pravastatin (PRAVACHOL) 20 MG tablet, TAKE 1/2 TABLET BY MOUTH DAILY, Disp: , Rfl:  pyridostigmine (MESTINON) 60 MG tablet, Take 0.5 tablets (30 mg total) by mouth 2 (two) times daily., Disp: 30 tablet, Rfl: 12 Respiratory Therapy Supplies (FLUTTER) DEVI, Use as directed., Disp: 1 each, Rfl: 0 selenium 50 MCG TABS, Take 50 mcg by mouth daily., Disp: , Rfl:  warfarin (COUMADIN) 2.5 MG tablet, Take 1 tablet (2.5 mg total) by mouth daily., Disp: 35 tablet, Rfl: 3 Zinc 22.5 MG TABS, Take by mouth daily., Disp: , Rfl:   No current facility-administered medications on file prior to visit.   Past Medical History:   Bronchiectasis  Comment:>PFT 07/13/2008 in [place] 1.9L/76%, FVC               2.45L/74, Ratio 79, TLC 121%, DLCO 64%  AE               BRonchiectasis - Dec 2010.New Rx:  outpatient -              Feb 2011 - Rx outpatient   Dyslipidemia                                                 Eczema                                                       Anemia                                                         Comment:- Hgb 9.7gm% on 07/13/2008 in [place] -  Hgg               129gm% wiht normal irone levsl and ferritin                10/27/2008 in [place] Recurrent otitis/sinusitis   Atrial fibrillation                                            Comment:chronic anticoag   Glaucoma                                                     Back pain                                       2009         Anxiety                                                        Comment:chronic BZ prn   Diverticulosis                                               COPD (chronic obstructive pulmonary disease)                   Comment:bronchiectasis   Hypertension  Arthritis                                                    IBS (irritable bowel syndrome)                               H. pylori infection                                          GERD with stricture                                          Hyponatremia                                                   Comment:chronic, s/p endo eval 06/2012   Hypothyroid                                                  DDD (degenerative disc disease), lumbar                        Comment:MRI 08/2012 - working with [facility]  Past Surgical History:   CATARACT EXTRACTION                                             Comment:both   FOOT SURGERY                                     03/14/2011      Comment:gastroc slide-rt   TOTAL ABDOMINAL HYSTERECTOMY                                  COLONOSCOPY                                                   BREAST SURGERY                                                  Comment:br bx   ESOPHAGOSCOPY W/ BOTOX INJECTION                 12/11/2011      Comment:Procedure: ESOPHAGOSCOPY WITH BOTOX INJECTION;  Surgeon: Rozetta Nunnery, MD;  Location:               [place];  Service: ENT;                Laterality: N/A;  esophogoscopy with dilation,               botox injection  Review of patient's family history indicates:   Hypertension                   Mother                   Stroke                          Mother                   Atrial fibrillation                                       Comment: siblings   Lung cancer                    Brother                  Emphysema                      Brother                  Other                          Father                     Comment: miner's lung   Cancer                         Father                     Comment: Lung   Colon polyps                   Sister                   Pancreatic cancer              Sister                   Kidney disease                 Sister                   Social History   Marital Status: Married             Spouse Name:                      Years of Education:                 Number of children: 2           Occupational History Occupation          Tax inspector  retired Pharmacist, hospital                           Social History Main Topics   Smoking Status: Never Smoker                     Smokeless Status: Never Used                       Alcohol Use: No             Drug Use: No             Sexual Activity: Not on file        Other Topics            Concern   None on file  Social History Narrative   2 children 4 grandchildren   Recently moved to [place] from [place]   Retired Pharmacist, hospital   Grew up in [place]. Moved to [place]. Moved to [place] Feb 2010 due to duaghther living in [place]. Son lives in [place], [place]. Husband works as Scientist, physiological in [place].    ROS: Stress and anxiety otherwise see history of present illness   PHYSICAL EXAM: Well-nournished, in no acute distress. Neck: No JVD, HJR, Bruit, or thyroid enlargement  Lungs: No tachypnea, clear without wheezing, rales, or rhonchi  Cardiovascular: RRR, PMI not displaced, heart sounds normal, no murmurs, gallops, bruit, thrill, or heave.  Abdomen: BS normal. Soft without organomegaly, masses, lesions or tenderness.  Extremities: without cyanosis, clubbing or edema. Good  distal pulses bilateral  SKin: Warm, no lesions or rashes   Musculoskeletal: No deformities  Neuro: no focal signs  BP 146/65  Pulse 82  Ht 5' 6.5" (1.689 m)  Wt 186 lb (84.369 kg)  BMI 29.57 kg/m2   EKG: Atrial fibrillation at 82 beats per minute no acute change

## 2013-07-08 NOTE — Assessment & Plan Note (Signed)
Recent cardiac catheterization performed at a [facility]. Patient claims that it was normal but we are trying to obtain records.

## 2013-07-08 NOTE — Assessment & Plan Note (Signed)
Currently the patient's heart rate is controlled on increased bystolic. We will try to obtain all records from [place] to review and have her follow up with Dr. Caryl Comes.

## 2013-07-08 NOTE — Patient Instructions (Signed)
Your physician recommends that you schedule a follow-up appointment in 1 month with Dr. Caryl Comes.  Restrict Sodium to no more than 2 grams a day.   Sodium and Fluid Restriction Some health conditions may require you to restrict your sodium and fluid intake. Sodium is part of the salt in the blood. Sodium may be restricted because when you take in a lot of salt, you become thirsty. Limiting salt with help you become less thirsty and may make it easier to restrict fluid. Talk to your caregiver or dietician about how many cups of fluid and how many milligrams of sodium you are allowed each day. If your caregiver has restricted your sodium and fluids, usually the amount you can drink depends on several things, such as:  Your urine output.  How much fluid you are retaining.  Your blood pressure. Every 2 cups (500 mL) of fluid retained in the body becomes an extra 1 pound (0.5 kg) of body weight. The following are examples of some fluids you will have to restrict:  Tea, coffee, soda, lemonade, milk, water, and juice.  Alcoholic beverages.  Cream.  Gravy.  Ice cubes.  Soup and broth. The following are foods that become liquid at room temperature. These foods will count towards your fluid intake.  Ice cream and ice milk.  Frozen yogurt and sherbet.  Frozen ice pops.  Flavored gelatin. YOU MAY BE TAKING IN TOO MUCH FLUID IF:  Your weight increases.  Your face, hands, legs, feet, and abdomen start to swell.  You have trouble breathing. HOME CARE INSTRUCTIONS If you follow a low sodium diet closely, you will eat approximately 1,500 mg of sodium a day.   Avoid salty foods. This increases your thirst and makes fluid control more difficult. Foods high in sodium include:  Most canned foods, including most meats.  Most processed foods, including most meats.  Cheese.  Dried pasta and rice mixes.  Snack foods (chips, popcorn, pretzels, cheese puffs, salted nuts).  Dips, sauces, and  salad dressings.  Do not use salt in cooking or add salt to your meal. Lacinda Axon with herbs and spices, but not those that have salt in the name. Ask your caregiver if it is okay to use salt substitutes.  Eat home-prepared meals. Use fresh ingredients. Avoid canned, frozen, or packaged meals.  Read food labels to see how much sodium is in the food. Know how much sodium you are allowed each day.  When eating out, ask for dressings and sauces on the side.  Weigh yourself every morning with an empty bladder before you eat or drink. If your weight is going up, you are retaining fluid.  Freeze fruit juice or water in an ice cube tray. Use this as part of your fluid allowance.  Brush your teeth often or rinse your mouth with mouthwash to help your dry mouth. Lemon wedges, hard sour candies, chewing gum, or breath spray may help to moisten your mouth too.  Add a slice of fresh lemon or lemon juice to water or ice. This helps satisfy your thirst.  Try frozen fruits between meals, such as grapes or strawberries.  Swallow your pills along with meals or soft foods. This helps you save your fluid for something you enjoy.  Use small cups and glasses and learn to sip fluids slowly.  Keep your home cooler. Keep the air in your home as humid as possible. Dry air increases thirst.  Avoid being out in the hot sun. Each morning, fill a  jug with the amount of water you are allowed for the day. You can use this water as a guideline for fluid allowance. Each time you take in fluid, pour an equal amount of water out of the container. This helps you to see how much fluid you are taking in. It also helps plan your fluid intake for the rest of the day. CONVERSIONS TO HELP MEASURE FLUID INTAKE  1 cup equals 8 oz (240 mL).   cup equals 6 oz (180 mL).   cup equals 5  oz (160 mL).   cup equals 4 oz (120 mL).   cup equals 2  oz (80 mL).   cup equals 2 oz (60 mL).  2 tbs equals 1 oz (30 mL). Document  Released: 04/15/2007 Document Revised: 12/18/2011 Document Reviewed: 11/30/2011 [facility] Patient Information 2014 [place], [place].

## 2013-07-08 NOTE — Assessment & Plan Note (Signed)
Patient had recent hospitalization and was diagnosed with congestive heart failure. Based on what she describes I believe it was diastolic heart failure. She did have a 2-D echo. We are waiting records to verify. Continue 2 g sodium diet and weigh daily. Call if any fluctuation or shortness of breath.

## 2013-07-09 ENCOUNTER — Telehealth: Payer: Self-pay | Admitting: Physician Assistant

## 2013-07-09 NOTE — Telephone Encounter (Signed)
Records rec From [facility] gave to Scheduling Dept

## 2013-07-09 NOTE — Telephone Encounter (Signed)
ROI faxed to [facility] @ [phone]

## 2013-07-13 ENCOUNTER — Other Ambulatory Visit: Payer: Self-pay | Admitting: Internal Medicine

## 2013-07-15 ENCOUNTER — Telehealth: Payer: Self-pay | Admitting: *Deleted

## 2013-07-15 NOTE — Telephone Encounter (Signed)
We received a fax from the pt pharmacy stating dymista needs a PA. We have had success in the past with prescribing the components of dymista as separate medications and insurance has been covering this. These would be fluticasone and astelin. Please advise if it is ok to prescribe these medications. Celina Bing, CMA

## 2013-07-15 NOTE — Telephone Encounter (Signed)
i dont believe I started on dymista. IF is easier to do 2 separate do it but check with her how she wants it  Dr. Brand Males, M.D., [facility].C.P Pulmonary and Critical Care Medicine Staff Physician [facility] Pager: [phone], If no answer or between  15:00h - 7:00h: call [phone]  07/15/2013 5:08 PM

## 2013-07-16 MED ORDER — FLUTICASONE PROPIONATE 50 MCG/ACT NA SUSP: 2.0000 | Freq: Every day | NASAL | Status: DC

## 2013-07-16 MED ORDER — AZELASTINE HCL 0.1 % NA SOLN: 2.0000 | Freq: Every day | NASAL | Status: DC

## 2013-07-16 NOTE — Telephone Encounter (Signed)
lmomtcb x1 for pt 

## 2013-07-16 NOTE — Telephone Encounter (Signed)
Rx was sent at visit on 07-06-13. Pt advised and is ok with separate meds. Rx sent. [place] Bing, CMA

## 2013-07-21 ENCOUNTER — Ambulatory Visit (INDEPENDENT_AMBULATORY_CARE_PROVIDER_SITE_OTHER): Payer: BC Managed Care – PPO | Admitting: *Deleted

## 2013-07-21 DIAGNOSIS — I4891 Unspecified atrial fibrillation: Secondary | ICD-10-CM

## 2013-07-21 LAB — POCT INR: INR: 2.9

## 2013-07-23 ENCOUNTER — Ambulatory Visit (INDEPENDENT_AMBULATORY_CARE_PROVIDER_SITE_OTHER): Payer: BC Managed Care – PPO | Admitting: Gastroenterology

## 2013-07-23 ENCOUNTER — Encounter: Payer: Self-pay | Admitting: Gastroenterology

## 2013-07-23 VITALS — BP 122/66 | HR 78 | Ht 66.5 in | Wt 183.4 lb

## 2013-07-23 DIAGNOSIS — K219 Gastro-esophageal reflux disease without esophagitis: Secondary | ICD-10-CM | POA: Diagnosis not present

## 2013-07-23 DIAGNOSIS — R1032 Left lower quadrant pain: Secondary | ICD-10-CM | POA: Diagnosis not present

## 2013-07-23 MED ORDER — OMEPRAZOLE 20 MG PO CPDR: 20.0000 mg | DELAYED_RELEASE_CAPSULE | Freq: Every day | ORAL | Status: DC

## 2013-07-23 MED ORDER — MESALAMINE 1.2 G PO TBEC: 1200.0000 mg | DELAYED_RELEASE_TABLET | Freq: Every day | ORAL | Status: DC

## 2013-07-23 NOTE — Progress Notes (Signed)
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 Radene Journey in ENT.  She denies esophageal problems at this time.  She also has diverticulosis with segmental colitis and is doing well on high fiber diet and Lialda 2.4 g a day.  She is up-to-date on her colonoscopy, endoscopy cannot be completed because of her severe cricopharyngeal spasm.  She denies rectal bleeding, melena, severe abdominal pain, or systemic complaints.  She has dry mouth syndrome and cannot use anticholinergic medications.  When she has left lower quadrant discomfort she uses by mouth Tylenol.  She has atrial fibrillation is chronically anticoagulated with warfarin.    Current Medications, Allergies, Past Medical History, Past Surgical History, Family History and Social History were reviewed in [employer] record.  ROS: All systems were reviewed and are negative unless otherwise stated in the HPI.         Assessment and plan: She is to continue other medications as previously outlined.  I have added twice a day Benefiber to her diet because of her diverticulosis and segmental colitis.  She takes daily omeprazole for acid reflux and is having no difficulty with this problem.  She sees Dr. [name] periodically per her swallowing difficulties.  She wishes to establish with Dr. Hilarie Fredrickson, and I have set up an appointment to see him in 4 months.

## 2013-07-23 NOTE — Patient Instructions (Addendum)
Please follow up with Dr. Hilarie Fredrickson in four months, schedule is not out yet so please call back to make that appointment  Please purchase Benefiber over the counter and take twice daily, can sprinkle in your food  New prescription for Omeprazole and Liadla  was sent to your pharmacy

## 2013-07-23 NOTE — Addendum Note (Signed)
Addended by: Hope Pigeon A on: 07/23/2013 10:54 AM   Modules accepted: Orders

## 2013-07-30 ENCOUNTER — Other Ambulatory Visit: Payer: Self-pay | Admitting: Internal Medicine

## 2013-08-11 ENCOUNTER — Ambulatory Visit (INDEPENDENT_AMBULATORY_CARE_PROVIDER_SITE_OTHER): Payer: BC Managed Care – PPO | Admitting: Internal Medicine

## 2013-08-11 ENCOUNTER — Encounter: Payer: Self-pay | Admitting: Internal Medicine

## 2013-08-11 ENCOUNTER — Ambulatory Visit (INDEPENDENT_AMBULATORY_CARE_PROVIDER_SITE_OTHER): Payer: BC Managed Care – PPO | Admitting: *Deleted

## 2013-08-11 VITALS — BP 144/75 | HR 74 | Ht 66.5 in | Wt 183.8 lb

## 2013-08-11 DIAGNOSIS — Z5181 Encounter for therapeutic drug level monitoring: Secondary | ICD-10-CM | POA: Insufficient documentation

## 2013-08-11 DIAGNOSIS — I4891 Unspecified atrial fibrillation: Secondary | ICD-10-CM

## 2013-08-11 LAB — POCT INR: INR: 3.1

## 2013-08-11 MED ORDER — FUROSEMIDE 20 MG PO TABS: 20.0000 mg | ORAL_TABLET | Freq: Every day | ORAL | Status: DC | PRN

## 2013-08-11 NOTE — Progress Notes (Addendum)
Patient Care Team: Rowe Clack, MD as PCP - General (Internal Medicine) Deboraha Sprang, MD (Cardiology) Sable Feil, MD (Gastroenterology) Brand Males, MD (Pulmonary Disease) Renato Shin, MD (Endocrinology) Erline Levine, MD (Neurosurgery) Colin Rhein, MD (Orthopedic Surgery) Rozetta Nunnery, MD (Otolaryngology)   HPI  Stephanie Cortez is a 80 y.o. female Seen in followup for long-standing atrial fibrillation dating back about 10 years. It is felt to be permanent having failed multiple cardioversions and antiarrhythmic drugs.   She was admitted for a because of chest pain elevated blood pressure and confirmed atrial fibrillation with a rapid rate. She underwent a catheterization which was nonobstructive apparently. An echocardiogram was also normal except for left atrial enlargement. These records were reviewed. The chest x-ray report showed some pulmonary edema. Her BNP was 1900(upper limit 1800)    She had been under great deal of psychosocial stress take care of her sister She did see a Social worker. Things withher sister are much better She saw her PCP and she began Prozac. She is much better and very appreciative.    She also has a history of bronchiectasis has been seeing pulmonary for this.   Past Medical History  Diagnosis Date  . Bronchiectasis     >PFT 07/13/2008 in [place] 1.9L/76%, FVC 2.45L/74, Ratio 79, TLC 121%, DLCO 64%  AE BRonchiectasis - Dec 2010.New Rx:  outpatient - Feb 2011 - Rx outpatient  . Dyslipidemia   . Eczema   . Anemia     - Hgb 9.7gm% on 07/13/2008 in [place] -  Hgg 129gm% wiht normal irone levsl and ferritin 10/27/2008 in [place] Recurrent otitis/sinusitis  . Atrial fibrillation     chronic anticoag  . Glaucoma   . Back pain 2009  . Anxiety     chronic BZ prn  . Diverticulosis   . COPD (chronic obstructive pulmonary disease)     bronchiectasis  . Hypertension   . Arthritis   . IBS (irritable bowel syndrome)   .  H. pylori infection   . GERD with stricture   . Hyponatremia     chronic, s/p endo eval 06/2012  . Hypothyroid   . DDD (degenerative disc disease), lumbar     MRI 08/2012 - working with NSurg  . CHF (congestive heart failure)     Past Surgical History  Procedure Laterality Date  . Cataract extraction      both  . Foot surgery  03/14/2011    gastroc slide-rt  . Total abdominal hysterectomy    . Colonoscopy    . Breast surgery      br bx  . Esophagoscopy w/ botox injection  12/11/2011    Procedure: ESOPHAGOSCOPY WITH BOTOX INJECTION;  Surgeon: Rozetta Nunnery, MD;  Location: [facility];  Service: ENT;  Laterality: N/A;  esophogoscopy with dilation, botox injection  . Cardiac catheterization      Current Outpatient Prescriptions  Medication Sig Dispense Refill  . azelastine (ASTELIN) 137 MCG/SPRAY nasal spray Place 2 sprays into both nostrils daily. Use in each nostril as directed  30 mL  2  . Azelastine-Fluticasone (DYMISTA) 137-50 MCG/ACT SUSP Place 2 sprays into the nose daily.  23 g  6  . budesonide-formoterol (SYMBICORT) 80-4.5 MCG/ACT inhaler Inhale 2 puffs into the lungs 2 (two) times daily.  1 Inhaler  5  . DHEA 10 MG TABS Take 5 mg by mouth daily.      [name] Kitchen FLUoxetine (PROZAC) 10 MG  capsule Take 1 capsule (10 mg total) by mouth daily.  90 capsule  1  . fluticasone (FLONASE) 50 MCG/ACT nasal spray Place 2 sprays into both nostrils daily.  16 g  2  . IODINE, KELP, PO Take 12.5 mg by mouth daily.      [name] Kitchen levothyroxine (SYNTHROID, LEVOTHROID) 50 MCG tablet Take 1 tablet (50 mcg total) by mouth daily.  90 tablet  1  . LORazepam (ATIVAN) 0.5 MG tablet TAKE 1 TABLET BY MOUTH EVERY 8 HOURS AS NEEDED FOR ANXIETY  90 tablet  0  . MAGNESIUM PO With Calcium, Takes 2 tbsp by mouth daily      . mesalamine (LIALDA) 1.2 G EC tablet Take 1 tablet (1.2 g total) by mouth daily with breakfast.  30 tablet  8  . Multiple Vitamin (MULTIVITAMIN) LIQD Take 15 mLs by mouth daily.        . nebivolol (BYSTOLIC) 5 MG tablet Take 1 tablet (5 mg total) by mouth daily.  30 tablet  12  . NON FORMULARY Take 100 mg by mouth daily. AnnatooTocotrienols      . olmesartan (BENICAR) 40 MG tablet Take 1 tablet (40 mg total) by mouth daily.  90 tablet  3  . omeprazole (PRILOSEC) 20 MG capsule Take 1 capsule (20 mg total) by mouth daily.  30 capsule  11  . Potassium (POTASSIMIN) 75 MG TABS Take 1 tablet by mouth daily. 1 tab po qd      . pravastatin (PRAVACHOL) 20 MG tablet TAKE 1/2 TABLET BY MOUTH DAILY      . pyridostigmine (MESTINON) 60 MG tablet Take 0.5 tablets (30 mg total) by mouth 2 (two) times daily.  30 tablet  12  . Respiratory Therapy Supplies (FLUTTER) DEVI Use as directed.  1 each  0  . selenium 50 MCG TABS Take 50 mcg by mouth daily.      [name] Kitchen warfarin (COUMADIN) 2.5 MG tablet TAKE 1 TABLET BY MOUTH DAILY  35 tablet  3  . Zinc 22.5 MG TABS Take by mouth daily.       No current facility-administered medications for this visit.    Allergies  Allergen Reactions  . Biaxin [Clarithromycin] Nausea And Vomiting  . Flagyl [Metronidazole] Nausea And Vomiting  . Moxifloxacin     REACTION: weakness  . Pantoprazole Sodium     REACTION: rash  . Penicillins     REACTION: rash    Review of Systems negative except from HPI and PMH  Physical Exam BP 144/75  Pulse 74  Ht 5' 6.5" (1.689 m)  Wt 183 lb 12.8 oz (83.371 kg)  BMI 29.23 kg/m2 Well developed and well nourished in no acute distress HENT normal E scleral and icterus clear Neck Supple JVP flat; carotids brisk and full Clear to ausculation Irregularly irregular rate and rhythm, 2/6 murmurs  Soft with active bowel sounds No clubbing cyanosis none Edema Alert and oriented, grossly normal motor and sensory function Skin Warm and Dry  ECG demonstrates atrial fibrillation at 72 Intervals-/08/38 Axis LI Otherwise normal  Assessment and  Plan  Atrial fibrillation-permanent  HFpEF The patient was hospitalized chest  pain and shortness of breath. Chest x-ray and labs suggested HFpEF. Catheterization demonstrated normal LVEDP and clean coronaries.  We have reviewed the physiology. We'll give her a prescription for Lasix 20 mg to take on an as-needed basis if her weight goes up more than 5 pounds in 3 day. r she is also advised that she does not need  to aspire to drinking   half a gallon of water a day.

## 2013-08-11 NOTE — Patient Instructions (Signed)
Your physician has recommended you make the following change in your medication:  1) Start Lasix 20 mg daily as needed  Your physician wants you to follow-up in: 6 months with [employer], PA-C.  You will receive a reminder letter in the mail two months in advance. If you don't receive a letter, please call our office to schedule the follow-up appointment.

## 2013-08-12 ENCOUNTER — Encounter: Payer: Self-pay | Admitting: Internal Medicine

## 2013-08-12 ENCOUNTER — Ambulatory Visit (INDEPENDENT_AMBULATORY_CARE_PROVIDER_SITE_OTHER): Payer: BC Managed Care – PPO | Admitting: Internal Medicine

## 2013-08-12 ENCOUNTER — Ambulatory Visit: Payer: BC Managed Care – PPO | Admitting: Internal Medicine

## 2013-08-12 VITALS — BP 118/70 | HR 77 | Temp 98.3°F | Wt 183.4 lb

## 2013-08-12 DIAGNOSIS — E039 Hypothyroidism, unspecified: Secondary | ICD-10-CM | POA: Diagnosis not present

## 2013-08-12 DIAGNOSIS — I4891 Unspecified atrial fibrillation: Secondary | ICD-10-CM | POA: Diagnosis not present

## 2013-08-12 DIAGNOSIS — R0982 Postnasal drip: Secondary | ICD-10-CM | POA: Diagnosis not present

## 2013-08-12 DIAGNOSIS — I2510 Atherosclerotic heart disease of native coronary artery without angina pectoris: Secondary | ICD-10-CM

## 2013-08-12 MED ORDER — LORATADINE 10 MG PO TABS: 10.0000 mg | ORAL_TABLET | Freq: Every day | ORAL | Status: DC

## 2013-08-12 MED ORDER — LEVOTHYROXINE SODIUM 50 MCG PO TABS: 50.0000 ug | ORAL_TABLET | Freq: Every day | ORAL | Status: DC

## 2013-08-12 MED ORDER — PRAVASTATIN SODIUM 20 MG PO TABS: ORAL_TABLET | ORAL | Status: DC

## 2013-08-12 MED ORDER — OLMESARTAN MEDOXOMIL 40 MG PO TABS: 40.0000 mg | ORAL_TABLET | Freq: Every day | ORAL | Status: DC

## 2013-08-12 MED ORDER — NEBIVOLOL HCL 5 MG PO TABS: 5.0000 mg | ORAL_TABLET | Freq: Every day | ORAL | Status: DC

## 2013-08-12 NOTE — Assessment & Plan Note (Signed)
09/2012 changed to Cytomel by Dr Sharol Roussel -  Now back on traditional levothyroxine Check TSH q48mo and advise adjustment as needed The current medical regimen is effective;  continue present plan and medications.   Lab Results  Component Value Date   TSH 1.62 02/10/2013

## 2013-08-12 NOTE — Assessment & Plan Note (Signed)
Advise use of daily antihistamine in addition to nasal spray as ongoing No pulmonary flare evident -to followup with pulmonary or ENT as needed

## 2013-08-12 NOTE — Progress Notes (Signed)
Pre-visit discussion using our clinic review tool. No additional management support is needed unless otherwise documented below in the visit note.  

## 2013-08-12 NOTE — Progress Notes (Signed)
Subjective:    Patient ID: Stephanie Cortez, female    DOB: 1934-02-01, 80 y.o.   MRN: [id number]  HPI Here for follow up - reviewed interval events  Past Medical History  Diagnosis Date  . Bronchiectasis     >PFT 07/13/2008 in [place] 1.9L/76%, FVC 2.45L/74, Ratio 79, TLC 121%, DLCO 64%  AE BRonchiectasis - Dec 2010.New Rx:  outpatient - Feb 2011 - Rx outpatient  . Dyslipidemia   . Eczema   . Anemia     - Hgb 9.7gm% on 07/13/2008 in [place] -  Hgg 129gm% wiht normal irone levsl and ferritin 10/27/2008 in [place] Recurrent otitis/sinusitis  . Atrial fibrillation     chronic anticoag  . Glaucoma   . Back pain 2009  . Anxiety     chronic BZ prn  . Diverticulosis   . COPD (chronic obstructive pulmonary disease)     bronchiectasis  . Hypertension   . Arthritis   . IBS (irritable bowel syndrome)   . H. pylori infection   . GERD with stricture   . Hyponatremia     chronic, s/p endo eval 06/2012  . Hypothyroid   . DDD (degenerative disc disease), lumbar     MRI 08/2012 - working with NSurg  . CHF (congestive heart failure)     Review of Systems      Objective:   Physical Exam BP 118/70  Pulse 77  Temp(Src) 98.3 F (36.8 C) (Oral)  Wt 183 lb 6.4 oz (83.19 kg)  SpO2 98% Wt Readings from Last 3 Encounters:  08/12/13 183 lb 6.4 oz (83.19 kg)  08/11/13 183 lb 12.8 oz (83.371 kg)  07/23/13 183 lb 6.4 oz (83.19 kg)   Constitutional: She appears well-developed and well-nourished. No distress.  Cardiovascular: Normal rate, irregular rhythm and normal heart sounds.  No murmur heard. very mild BLE edema at ankles. Pulmonary/Chest: Effort normal and breath sounds normal. No respiratory distress. She has no wheezes.  Psychiatric: She has a pleasant but mildly anxious mood and affect. Her behavior is normal. Judgment and thought content normal.   Lab Results  Component Value Date   WBC 8.7 10/07/2012   HGB 13.6 10/07/2012   HCT 41 10/07/2012   PLT 314 10/07/2012   GLUCOSE 100*  02/10/2013   CHOL 197 10/07/2012   TRIG 78 10/07/2012   HDL 88* 10/07/2012   LDLCALC 93 10/07/2012   ALT 24 10/07/2012   AST 28 10/07/2012   NA 134* 02/10/2013   K 4.4 02/10/2013   CL 102 02/10/2013   CREATININE 0.8 02/10/2013   BUN 10 02/10/2013   CO2 23 02/10/2013   TSH 1.62 02/10/2013   INR 3.1 08/11/2013   HGBA1C 5.4 10/07/2012       Assessment & Plan:   Problem List Items Addressed This Visit   ATRIAL FIBRILLATION - Primary     Hospitalization for same December 2014 in [place] Catheterization performed December 29, clean coronaries with normal LV function Continue medical management as ongoing with rate control and anticoagulation Followup with cardiology as ongoing Caryl Comes) - last office visit reviewed    Relevant Medications      pravastatin (PRAVACHOL) tablet      olmesartan (BENICAR) tablet      nebivolol (BYSTOLIC) tablet   Hypothyroid      09/2012 changed to Cytomel by Dr Sharol Roussel -  Now back on traditional levothyroxine Check TSH q72mo and advise adjustment as needed The current medical regimen is effective;  continue present  plan and medications.   Lab Results  Component Value Date   TSH 1.62 02/10/2013      Relevant Medications      levothyroxine (SYNTHROID, LEVOTHROID) tablet   Post-nasal drip     Advise use of daily antihistamine in addition to nasal spray as ongoing No pulmonary flare evident -to followup with pulmonary or ENT as needed        Time spent with pt today 25 minutes, greater than 50% time spent counseling patient on anxiety and depression, thyroid, sodium and medication review. Also review of prior records

## 2013-08-12 NOTE — Patient Instructions (Signed)
It was good to see you today.  We have reviewed your prior records including labs and tests today  Medications reviewed and updated,  Begin claritin once daily for postnasal drip -no other changes recommended at this time. If unimproved, please followup with your GI specialist or Dr. [name] as planned  Refill on medication(s) as discussed today.  Please schedule followup in 6 months, call sooner if problems.

## 2013-08-12 NOTE — Assessment & Plan Note (Signed)
Hospitalization for same December 2014 in [place] Catheterization performed December 29, clean coronaries with normal LV function Continue medical management as ongoing with rate control and anticoagulation Followup with cardiology as ongoing Caryl Comes) - last office visit reviewed

## 2013-08-26 DIAGNOSIS — R490 Dysphonia: Secondary | ICD-10-CM | POA: Diagnosis not present

## 2013-08-26 DIAGNOSIS — J310 Chronic rhinitis: Secondary | ICD-10-CM | POA: Diagnosis not present

## 2013-09-01 ENCOUNTER — Ambulatory Visit (INDEPENDENT_AMBULATORY_CARE_PROVIDER_SITE_OTHER): Payer: BC Managed Care – PPO | Admitting: *Deleted

## 2013-09-01 DIAGNOSIS — I4891 Unspecified atrial fibrillation: Secondary | ICD-10-CM

## 2013-09-01 DIAGNOSIS — Z5181 Encounter for therapeutic drug level monitoring: Secondary | ICD-10-CM | POA: Diagnosis not present

## 2013-09-01 LAB — POCT INR: INR: 2.7

## 2013-09-07 ENCOUNTER — Other Ambulatory Visit: Payer: Self-pay | Admitting: Internal Medicine

## 2013-09-22 ENCOUNTER — Ambulatory Visit (INDEPENDENT_AMBULATORY_CARE_PROVIDER_SITE_OTHER): Payer: BC Managed Care – PPO | Admitting: Pharmacist

## 2013-09-22 DIAGNOSIS — Z5181 Encounter for therapeutic drug level monitoring: Secondary | ICD-10-CM | POA: Diagnosis not present

## 2013-09-22 DIAGNOSIS — I4891 Unspecified atrial fibrillation: Secondary | ICD-10-CM

## 2013-09-22 LAB — POCT INR: INR: 1.7

## 2013-09-23 ENCOUNTER — Ambulatory Visit (INDEPENDENT_AMBULATORY_CARE_PROVIDER_SITE_OTHER): Payer: BC Managed Care – PPO | Admitting: Internal Medicine

## 2013-09-23 ENCOUNTER — Encounter: Payer: Self-pay | Admitting: Internal Medicine

## 2013-09-23 ENCOUNTER — Other Ambulatory Visit (INDEPENDENT_AMBULATORY_CARE_PROVIDER_SITE_OTHER): Payer: BC Managed Care – PPO

## 2013-09-23 ENCOUNTER — Telehealth: Payer: Self-pay | Admitting: Pulmonary Disease

## 2013-09-23 VITALS — BP 144/76 | HR 63 | Temp 98.1°F | Ht 65.25 in | Wt 182.0 lb

## 2013-09-23 DIAGNOSIS — J479 Bronchiectasis, uncomplicated: Secondary | ICD-10-CM | POA: Diagnosis not present

## 2013-09-23 DIAGNOSIS — R059 Cough, unspecified: Secondary | ICD-10-CM

## 2013-09-23 DIAGNOSIS — R05 Cough: Secondary | ICD-10-CM

## 2013-09-23 DIAGNOSIS — I2510 Atherosclerotic heart disease of native coronary artery without angina pectoris: Secondary | ICD-10-CM

## 2013-09-23 DIAGNOSIS — R058 Other specified cough: Secondary | ICD-10-CM

## 2013-09-23 LAB — CBC WITH DIFFERENTIAL/PLATELET
Basophils Absolute: 0 10*3/uL (ref 0.0–0.1)
Basophils Relative: 0.7 % (ref 0.0–3.0)
Eosinophils Absolute: 0.3 10*3/uL (ref 0.0–0.7)
Eosinophils Relative: 5.2 % — ABNORMAL HIGH (ref 0.0–5.0)
HCT: 39.1 % (ref 36.0–46.0)
Hemoglobin: 12.9 g/dL (ref 12.0–15.0)
Lymphocytes Relative: 23.9 % (ref 12.0–46.0)
Lymphs Abs: 1.4 10*3/uL (ref 0.7–4.0)
MCHC: 33 g/dL (ref 30.0–36.0)
MCV: 95.3 fl (ref 78.0–100.0)
Monocytes Absolute: 0.6 10*3/uL (ref 0.1–1.0)
Monocytes Relative: 10.7 % (ref 3.0–12.0)
Neutro Abs: 3.5 10*3/uL (ref 1.4–7.7)
Neutrophils Relative %: 59.5 % (ref 43.0–77.0)
Platelets: 239 10*3/uL (ref 150.0–400.0)
RBC: 4.11 Mil/uL (ref 3.87–5.11)
RDW: 12.9 % (ref 11.5–14.6)
WBC: 5.9 10*3/uL (ref 4.5–10.5)

## 2013-09-23 MED ORDER — PREDNISONE 10 MG PO TABS: ORAL_TABLET | ORAL | Status: DC

## 2013-09-23 NOTE — Telephone Encounter (Signed)
MR pt. Called, spoke with pt - c/o runny nose with clear drainage, cough with thick mucus, and fatigue.  Symptoms have been present off and on since Dec.  Has increased SOB x few days.  Is using saline rinses and flonase with not much relief.  Requesting OV -- this has been scheduled for today at 10:45 am with MW.  Pt aware and in agreement with this.

## 2013-09-23 NOTE — Patient Instructions (Addendum)
Continue symbicort 80 Take 2 puffs first thing in am and then another 2 puffs about 12 hours later - smooth deep breath and if not better stop it and see Dr Jamey Reas two weeks    GERD (REFLUX)  is an extremely common cause of respiratory symptoms, many times with no significant heartburn at all.    It can be treated with medication, but also with lifestyle changes including avoidance of late meals, excessive alcohol, smoking cessation, and avoid fatty foods, chocolate, peppermint, colas, red wine, and acidic juices such as orange juice.  NO MINT OR MENTHOL PRODUCTS SO NO COUGH DROPS  USE SUGARLESS CANDY INSTEAD (jolley ranchers or Stover's or life savers)  NO OIL BASED VITAMINS - use powdered substitutes.  For drainage ok to try chlortrimeton 4 mg take one  every 4 hours as needed instead zyrtec (but may cause cause drowsiness)    If all else fails, ok to Prednisone 10 mg take  4 each am x 2 days,   2 each am x 2 days,  1 each am x 2 days and stop   Please remember to go to the lab  department downstairs for your tests - we will call you with the results when they are available.   Late add: Dr Mariel Sleet records requested

## 2013-09-23 NOTE — Progress Notes (Signed)
Subjective:    Patient ID: Stephanie Cortez, female    DOB: 11-02-1933, 80 y.o.   MRN: [id number]  HPI  80 yo WF with known hx of Bronchietasis  Follouwp Bronchiectasis. Last ollowup Bronchiectasis. Last seen August 2010, Dec 2010, Jan 2011, FEb 2011 (AE bronciectasis), and  Oct 2011. S/p pulmonary rehab in summer 2011.  Since then had one more visit in Nov 2011 for  AE bronchiectasis. Since then doing well. Denies cough or dyspnea but feels chest is just a bit more tight than usual. . No other specific complaints.  Denies chest pain, dyspnea, orthopnea, hemoptysis, fever, n/v/d, edema, headache.  However, does admit to increased fatigue  + . Uses symbicort as needed which is around 2-3 times per week. Is requesting spirometry  >>rx  symbicort daily    OV 04/12/11:  Using symbicort prn and overall doing well. Only has minimal wheeze. She is anticipatory and pro-acitve in care. When sinus congestion develops uses netti pot and lemon-honey vapors for relief. This prevents wheezing. Typically winter is when she has AE bronchiectasis. Wants to know if she should take prn symbicort or go on daily.  >>no changes   05/23/2011 Acute OV  Complains of head congestion, clear mucus production, PND, chest congestion, cough x1week .  Taking mucinex with some help. Mucus is all clear. No fever or discolored mucus.  Worried that she will be sick over the holidays and she is leaving to go out of town.  She is using netti pot with good relief. She has increased her symbicort Twice daily  With decreased cough.  >>rx doxycycline  11/29 /2012 Follow up  Pt returns for follow up . She is feeling is better. Currently  taking doxycycline.  reports breathing has improved but still having sinuscongestion and drainage. Last ov she was given Augmentin -unfortunately has hx of PCN allergy and abx was changed prior to taking. She is tolerating abx well. Congestion and cough are much better.  No fever or chest pain.   >>No changes   06/19/2011 Acute OV Complains of wheezing, head congestion with green drainage, hacky cough x3-4days. OTC not helping. Using Netti pot rinses .  No hemotpysis . Worse in am and late at night.  Cough is getting  Worse. Does not want to be sick for the holidays-has too much to do.  Under a lot of stress with sister that is ill. Traveling back and forth to [place].     Past Med: has changed PMD to Dr Etter Sjogren.  Social: taking care of elder sister in her 34s who has now become dependent Fam hx: as in soccial. No other change   REC Continue on Symbicort 2 puffs Twice daily Until back to your baseline  Mucinex Twice daily As needed Cough/congestion  Fluids and rest  I have given you a prescription for antibiotic--Zpack To have on hold in case  You developed discolored mucus that is not improving.  Saline nasal rinses As needed  Please contact office for sooner follow up if symptoms do not improve or worsen or seek emergency care  follow up Dr. Chase Caller as planned and As needed   OV 08/01/2011  D 23 of 28 biaxin. Blocked sinus. Sinus pressure. Hx of eustachian tube grommets. No resp issues. Netti pot not working - too blocked. Denies fever, sputum, resp exacerbation, wheeze, edema, orthopnea, paroxysmal nocturnal dyspnea    Currently lungs appear ok  Our coordinator will find out who saaw you in [facility].  We will try to have you seen this week  REturn in 3 months or sooner if needed  Please continue with your medications  OV 10/30/2011  Sinus drainage copious - intolerant to netti pot  - burns, does not want nasal steroids or anti histamine. Wants to go back to ENT. This is making tickle in throat that is severe x 1 week with mucus stuck in throat and gag. CT abdomen lung cut April 2013 for abdominal pain shows clear lung fields with some basal atelectasis. Do not see any basal bronchiectasis  Past, Family, Social reviewed: gi issues, gi scope pending  Currently lungs  appear ok  Our nurse will have you see Dr in [facility] asap for sinus Issues  Take care of your gi issues  REturn in 6 months or sooner if needed  Please continue with your medications  #Overweight  - Anderson Malta will show you the [facility] lipid diet sheet and how to use it  #Followup  - 6 - 9 months or sooner if needed  - spirometry at followup   OV 04/29/2012  Overall ok. Younger sister in [place] died. She is sad. She is having  1 week of increased cough with chest tightness but no sputum or fever. Is asociated with scratchy throat. She feels antibiptics indicated because she wants to make it to funeral. She wants prednisone on standby. No other new issues. Has seen ENT  08/27/2012 Acute OV  Complains of having nasal congestion, wheezing, prod cough at times w green/yellow mucus x2-3 weeks. Has been using netti pot and flonase and symbicort .  No hemoptysis, chest pain , edema or n/v.   Doxycyline 100mg  Twice daily For 7 days  Mucinex DM Twice daily As needed Cough/congestion  Fluids and rest  Saline nasal rinses As needed  Please contact office for sooner follow up if symptoms do not improve or worsen or seek emergency care  follow up Dr. Chase Caller as planned and As needed   OV 11/10/2012  Followup bronchiectasis not otherwise specified.  - Overall doing well. The past few weeks for the allergy season is having increased sinus symptoms. This also corresponds to the fact that she has not taken her Flonase as advised. For the last couple of days she feels like she is getting a bronchitis infection with yellow phlegm. She feels associated fatigue. She is scheduled trip to [place] to see her sister but she feels that she cannot make it and would prefer not to go. Mild increase in wheezing but otherwise no problems. She is compliant with Symbicort. No fever no hemoptysis   Please have PFT and CT chest  Will call with results  Will also refer to pulmonary rehab  FU in3-4 months   OV  03/10/2013 FU bronchiectasis. Here to discuss PFT and CT results    Pft 01/29/13 is similar to 2010 : fvc 1.9L/65%, fev1 1.66L/74%, ratio86/116%, No BD resopnse. TLC 80%, DLCO 72%. Mild restriction. No change sinc 2010. HAs Mixed REstriction-Obstruction with mild reduced DLCO    CT chest 01/30/13 1. Bronhciectasis - real mild and limited to right upper lobe only.   2. Evidence of asthma /obstructive lung disease on CT.   - Inspiratory and  expiratory imaging demonstrates a profound mosaic appearance of the lung parenchyma on the expiratory phase with areas of lucency interspersed with areas of ground-glass attenuation with intervening geographic margins, compatible with severe air  trapping, suggesting small airways disease. In addition, during expiratory phase imaging, the trachea  and mainstem bronchi appear slightly collapsed, which may indicate a mild tracheobronchomalacia.     3. Lung nodule seen   - LUL 1 x 1.6cm. GGO with solid component.   - 4 mm ground-glass attenuation  nodule in the left upper lobe (image 22 of series 5)  4. Coronary artery atherosclrosis seen - she denies having had cardiac stress test in past; due to see dr Caryl Comes 03/12/13 per hx  #Coronary ARtery Atherosclerosis  - please see dr Caryl Comes and discuss having cardiac stress test  #Bronchiectasis and Obstructive Lung disease  - no evidence of flare; so stop doxycycline  - have flu shot 03/10/2013  - continue symbicort  - try tudorza 1 puff twice daily - take several samples  #Left lung upper lobe nodule 1.6cm   - there is intermediate probability that this is slow growing lung cancer called BAC  - will have [facility] blood test call you and do test; test helpful if negative - INDETER<OMATE  - REpeat CT chest wo contrast first week Nov 2014  - might order Oncimmune lab test and PET scan depending on fu scan in Nov 2014  #SInus drainage  - take some sample of nasal steroid  - use 3% hypertonic saline spray -  OTC by a company called [facility]  #Followup   - Early November 2014 after CT chest   OV 06/08/2013  Here to discuss results. Overall stable  CT chestr Nov 2014 CT chest COMPARISON: 01/30/2013.  FINDINGS:  No pathologically enlarged mediastinal or axillary lymph nodes.  Hilar regions are difficult to definitively evaluate without IV  contrast but appear grossly unremarkable. Pulmonary arteries are  borderline enlarged. Heart is enlarged. Atherosclerotic  calcification of the arterial vasculature with involvement of the  proximal left coronary artery. No pericardial effusion.  Biapical pleural parenchymal scarring. There is an upper lobe  predominant pattern of subpleural reticulation, patchy ground-glass  and mild architectural distortion. Scattered mild bronchiectasis.  Confluent airspace opacities seen in the upper lobes on the prior  exam appear less prominent but not resolved today (example image  15). Calcified granuloma in the lingula. No pleural fluid. Airway is  unremarkable.  Incidental imaging of the upper abdomen shows no acute findings. A  peripherally calcified splenic artery aneurysm measures 9 mm, as  before. No worrisome lytic or sclerotic lesions. Degenerative  changes are seen in the spine.  IMPRESSION:  1. Mild subpleural reticulation, patchy ground-glass, scattered  bronchiectasis and architectural distortion in the upper lobes,  somewhat evolved from 01/30/2013, but without a discrete pulmonary  nodule. 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  2. Splenic artery aneurysm, stable.  Electronically Signed  By: Lorin Picket M.D.  On: 05/25/2013 17:32    REC #Lung nodule with post inflammatory fibrosis - CMA will print copy of NOv  2014 CT report  - resolved nodule but for fibrosis part do CT chest in 5 months  - High Resolution CT chest without contrast on ILD protocol. Only  Dr Lorin Picket or Dr. Vinnie Langton to  read  #Bronchiectasis  - stable   Continue inhalers   OV 07/06/2013 - ACUTE VISIT  Apparently 2 months ago Dr Caryl Comes cardiologist changed some of her heart(A Fib ) med. THen over the holidays was in [facility]. On 06/29/13 had acute rise in BP associated wiuth ddyspnea and feeling clammy. REview of her hx suggests normal cath and a diagnosis of DIastolic CHF presumably. Diuresed and she is well now. SHe  is emotpinally upset over whole incident and presents with her daughter for review. SHe denies pumopnary issues. Hs fu with cards next few days rec SOunds like you had Diastolic Heart Failure [id number] in [place] This might have been related to medication change, salt intake, high bp Please follow with cardiology office   09/23/2013 f/u ov/Alixander Rallis re:  Chief Complaint  Patient presents with  . Acute Visit    Pt c/o increased cough and SOB for the past month. Cough is occ prod with minimal clear sputum.   on symbiocrt 2 bid and prilosec x 30 min ac  Cough /sob go to together x one month more spells during the day   Cough when breath in, has sensation of excess mucus/sinus drainage  "confirmed by [employer]" > rec flonase did not help drianage  Though. Not really sob when not coughing   No obvious other patterns in day to day or daytime variabilty or assoc  cp or chest tightness, subjective wheeze overt sinus or hb symptoms. No unusual exp hx or h/o childhood pna/ asthma or knowledge of premature birth.  Sleeping ok without nocturnal  or early am exacerbation  of respiratory  c/o's or need for noct saba. Also denies any obvious fluctuation of symptoms with weather or environmental changes or other aggravating or alleviating factors except as outlined above   Current Medications, Allergies, Complete Past Medical History, Past Surgical History, Family History, and Social History were reviewed in [employer] record.  ROS  The following are not active complaints unless bolded sore  throat, dysphagia, dental problems, itching, sneezing,  nasal congestion or excess/ purulent secretions, ear ache,   fever, chills, sweats, unintended wt loss, pleuritic or exertional cp, hemoptysis,  orthopnea pnd or leg swelling, presyncope, palpitations, heartburn, abdominal pain, anorexia, nausea, vomiting, diarrhea  or change in bowel or urinary habits, change in stools or urine, dysuria,hematuria,  rash, arthralgias, visual complaints, headache, numbness weakness or ataxia or problems with walking or coordination,  change in mood/affect or memory.                   Objective:   Physical Exam   amb wf nad mod hoarse with rambling speech  Wt Readings from Last 3 Encounters:  09/23/13 182 lb (82.555 kg)  08/12/13 183 lb 6.4 oz (83.19 kg)  08/11/13 183 lb 12.8 oz (83.371 kg)     HEENT: nl dentition, turbinates, and orophanx. Nl external ear canals without cough reflex   NECK :  without JVD/Nodes/TM/ nl carotid upstrokes bilaterally   LUNGS: no acc muscle use, clear to A and P bilaterally without cough on insp or exp maneuvers   CV:  RRR  no s3 or murmur or increase in P2, no edema   ABD:  soft and nontender with nl excursion in the supine position. No bruits or organomegaly, bowel sounds nl  MS:  warm without deformities, calf tenderness, cyanosis or clubbing  SKIN: warm and dry without lesions    NEURO:  alert, approp, no deficits                Assessment & Plan:

## 2013-09-24 LAB — ALLERGY PROFILE REGION II-DC, DE, MD, ~~LOC~~, VA
Allergen, D pternoyssinus,d7: <0.1 kU/L
Alternaria Alternata: <0.1 kU/L
Aspergillus fumigatus, m3: <0.1 kU/L
Bermuda Grass: <0.1 kU/L
Box Elder IgE: <0.1 kU/L
Cat Dander: <0.1 kU/L
Cladosporium Herbarum: <0.1 kU/L
Cockroach: <0.1 kU/L
Common Ragweed: <0.1 kU/L
D. farinae: <0.1 kU/L
Dog Dander: <0.1 kU/L
Elm IgE: <0.1 kU/L
IgE (Immunoglobulin E), Serum: 12.7 IU/mL (ref 0.0–180.0)
Johnson Grass: <0.1 kU/L
Lamb's Quarters: <0.1 kU/L
Meadow Grass: <0.1 kU/L
Oak: <0.1 kU/L
Pecan/Hickory Tree IgE: <0.1 kU/L

## 2013-09-25 ENCOUNTER — Telehealth: Payer: Self-pay | Admitting: Internal Medicine

## 2013-09-25 NOTE — Assessment & Plan Note (Signed)
-   [facility] eval  08/2013 > requested records 09/25/2013  - Allergy profile 09/23/2013 >> Eos 5.2%, IgE 12.7  With neg RAST   Classic Upper airway cough syndrome, so named because it's frequently impossible to sort out how much is  CR/sinusitis with freq throat clearing (which can be related to primary GERD)   vs  causing  secondary (" extra esophageal")  GERD from wide swings in gastric pressure that occur with throat clearing, often  promoting self use of mint and menthol lozenges that reduce the lower esophageal sphincter tone and exacerbate the problem further in a cyclical fashion.   These are the same pts (now being labeled as having "irritable larynx syndrome" by some cough centers) who not infrequently have a history of having failed to tolerate ace inhibitors,  dry powder inhalers or biphosphonates or report having atypical reflux symptoms that don't respond to standard doses of PPI , and are easily confused as having aecopd or asthma flares by even experienced allergists/ pulmonologists.   For now add 1st gen H1 per guidelines on a trial basis and max gerd rx/ reviewed

## 2013-09-25 NOTE — Assessment & Plan Note (Signed)
The proper method of use, as well as anticipated side effects, of a metered-dose inhaler are discussed and demonstrated to the patient. Improved effectiveness after extensive coaching during this visit to a level of approximately  75% so ok to continue low dose symbicort 2 bid  No indication for escalation of care

## 2013-09-25 NOTE — Telephone Encounter (Signed)
Called, spoke with pt.  She was seen by MW on 09/23/13:  Patient Instructions     Continue symbicort 80 Take 2 puffs first thing in am and then another 2 puffs about 12 hours later - smooth deep breath and if not better stop it and see Dr Jamey Reas two weeks  GERD (REFLUX) is an extremely common cause of respiratory symptoms, many times with no significant heartburn at all.  It can be treated with medication, but also with lifestyle changes including avoidance of late meals, excessive alcohol, smoking cessation, and avoid fatty foods, chocolate, peppermint, colas, red wine, and acidic juices such as orange juice.  NO MINT OR MENTHOL PRODUCTS SO NO COUGH DROPS  USE SUGARLESS CANDY INSTEAD (jolley ranchers or Stover's or life savers)  NO OIL BASED VITAMINS - use powdered substitutes.  For drainage ok to try chlortrimeton 4 mg take one every 4 hours as needed instead zyrtec (but may cause cause drowsiness)  If all else fails, ok to Prednisone 10 mg take 4 each am x 2 days, 2 each am x 2 days, 1 each am x 2 days and stop  Please remember to go to the lab department downstairs for your tests - we will call you with the results when they are available.  Late add: Dr Mariel Sleet records requested   --------  Pt states she is a little better but still has the chest congestion and cough with thick, yellow mucus.  States she has started the chlortrimeton, is working on her symbicort technique, and has stocked up on sugar free candy.  Pt feels she should go ahead and start prednisone d/t the cough and congestion.  She would like MW to be aware she is taking the pred rx to pharm today to get filled.  Will sign off and route to MW as Juluis Rainier.

## 2013-09-25 NOTE — Progress Notes (Signed)
Quick Note:  LMTCB ______ 

## 2013-09-28 ENCOUNTER — Telehealth: Payer: Self-pay | Admitting: Internal Medicine

## 2013-09-28 ENCOUNTER — Telehealth: Payer: Self-pay | Admitting: *Deleted

## 2013-09-28 NOTE — Progress Notes (Signed)
Quick Note:  LMTCB ______ 

## 2013-09-28 NOTE — Telephone Encounter (Signed)
Pt is aware of normal lab results.

## 2013-09-28 NOTE — Telephone Encounter (Signed)
error 

## 2013-10-09 ENCOUNTER — Ambulatory Visit (INDEPENDENT_AMBULATORY_CARE_PROVIDER_SITE_OTHER): Payer: BC Managed Care – PPO | Admitting: *Deleted

## 2013-10-09 ENCOUNTER — Telehealth: Payer: Self-pay | Admitting: Internal Medicine

## 2013-10-09 DIAGNOSIS — Z5181 Encounter for therapeutic drug level monitoring: Secondary | ICD-10-CM

## 2013-10-09 DIAGNOSIS — I4891 Unspecified atrial fibrillation: Secondary | ICD-10-CM

## 2013-10-09 LAB — POCT INR: INR: 2.8

## 2013-10-09 MED ORDER — CEFDINIR 300 MG PO CAPS: 300.0000 mg | ORAL_CAPSULE | Freq: Two times a day (BID) | ORAL | Status: DC

## 2013-10-09 NOTE — Telephone Encounter (Signed)
Per OV w/ MW 09/23/13: Continue symbicort 80 Take 2 puffs first thing in am and then another 2 puffs about 12 hours later - smooth deep breath and if not better stop it and see Dr Jamey Reas two weeks   GERD (REFLUX)  is an extremely common cause of respiratory symptoms, many times with no significant heartburn at all.   It can be treated with medication, but also with lifestyle changes including avoidance of late meals, excessive alcohol, smoking cessation, and avoid fatty foods, chocolate, peppermint, colas, red wine, and acidic juices such as orange juice.   NO MINT OR MENTHOL PRODUCTS SO NO COUGH DROPS  USE SUGARLESS CANDY INSTEAD (jolley ranchers or Stover's or life savers)   NO OIL BASED VITAMINS - use powdered substitutes. For drainage ok to try chlortrimeton 4 mg take one  every 4 hours as needed instead zyrtec (but may cause cause drowsiness)   If all else fails, ok to Prednisone 10 mg take  4 each am x 2 days,   2 each am x 2 days,  1 each am x 2 days and stop  Please remember to go to the lab  department downstairs for your tests - we will call you with the results when they are available.   Late add: Dr Mariel Sleet records requested ----  Spoke with pt. She tried the prednisone and still not feeling better. C/o blowing out orange color phlem, prod cough-orange tint phlem, PND, nasal congestion. Sometimes talking makes her cough. She is using the flutter valve and it helps. The chlortrimeton did not help. Denies any SOB, no lung pain. Pt has pending appt with MR on Monday. Requesting recs since it is the weekend coming up. Doxy upsets stomach per pt. Please advise MW thanks  --call cell phone [facility] Allergies  Allergen Reactions  . Biaxin [Clarithromycin] Nausea And Vomiting  . Flagyl [Metronidazole] Nausea And Vomiting  . Moxifloxacin     REACTION: weakness  . Pantoprazole Sodium     REACTION: rash  . Penicillins     REACTION: rash

## 2013-10-09 NOTE — Telephone Encounter (Signed)
omnicef 300 mg bid x 10 days - has very good GI tolerance and extremely low likelihood of pcn interaction but if gets rash stop it right away

## 2013-10-09 NOTE — Telephone Encounter (Signed)
Pt aware RX called in. Nothing further needed 

## 2013-10-12 ENCOUNTER — Encounter: Payer: Self-pay | Admitting: Internal Medicine

## 2013-10-12 ENCOUNTER — Ambulatory Visit (INDEPENDENT_AMBULATORY_CARE_PROVIDER_SITE_OTHER): Payer: BC Managed Care – PPO | Admitting: Internal Medicine

## 2013-10-12 VITALS — BP 128/80 | HR 89 | Ht 67.0 in | Wt 184.0 lb

## 2013-10-12 DIAGNOSIS — I2510 Atherosclerotic heart disease of native coronary artery without angina pectoris: Secondary | ICD-10-CM

## 2013-10-12 DIAGNOSIS — J471 Bronchiectasis with (acute) exacerbation: Secondary | ICD-10-CM

## 2013-10-12 MED ORDER — PREDNISONE 10 MG PO TABS: 10.0000 mg | ORAL_TABLET | Freq: Every day | ORAL | Status: DC

## 2013-10-12 NOTE — Progress Notes (Signed)
Subjective:    Patient ID: Stephanie Cortez, female    DOB: 10/19/1933, 80 y.o.   MRN: [id number]  HPI   80 yo WF with known hx of Bronchietasis  Follouwp Bronchiectasis. Last ollowup Bronchiectasis. Last seen August 2010, Dec 2010, Jan 2011, FEb 2011 (AE bronciectasis), and  Oct 2011. S/p pulmonary rehab in summer 2011.  Since then had one more visit in Nov 2011 for  AE bronchiectasis. Since then doing well. Denies cough or dyspnea but feels chest is just a bit more tight than usual. . No other specific complaints.  Denies chest pain, dyspnea, orthopnea, hemoptysis, fever, n/v/d, edema, headache.  However, does admit to increased fatigue  + . Uses symbicort as needed which is around 2-3 times per week. Is requesting spirometry  >>rx  symbicort daily    OV 04/12/11:  Using symbicort prn and overall doing well. Only has minimal wheeze. She is anticipatory and pro-acitve in care. When sinus congestion develops uses netti pot and lemon-honey vapors for relief. This prevents wheezing. Typically winter is when she has AE bronchiectasis. Wants to know if she should take prn symbicort or go on daily.  >>no changes   05/23/2011 Acute OV  Complains of head congestion, clear mucus production, PND, chest congestion, cough x1week .  Taking mucinex with some help. Mucus is all clear. No fever or discolored mucus.  Worried that she will be sick over the holidays and she is leaving to go out of town.  She is using netti pot with good relief. She has increased her symbicort Twice daily  With decreased cough.  >>rx doxycycline  11/29 /2012 Follow up  Pt returns for follow up . She is feeling is better. Currently  taking doxycycline.  reports breathing has improved but still having sinuscongestion and drainage. Last ov she was given Augmentin -unfortunately has hx of PCN allergy and abx was changed prior to taking. She is tolerating abx well. Congestion and cough are much better.  No fever or chest pain.   >>No changes   06/19/2011 Acute OV Complains of wheezing, head congestion with green drainage, hacky cough x3-4days. OTC not helping. Using Netti pot rinses .  No hemotpysis . Worse in am and late at night.  Cough is getting  Worse. Does not want to be sick for the holidays-has too much to do.  Under a lot of stress with sister that is ill. Traveling back and forth to [place].     Past Med: has changed PMD to Dr Laury Axon.  Social: taking care of elder sister in her 17s who has now become dependent Fam hx: as in soccial. No other change   REC Continue on Symbicort 2 puffs Twice daily Until back to your baseline  Mucinex Twice daily As needed Cough/congestion  Fluids and rest  I have given you a prescription for antibiotic--Zpack To have on hold in case  You developed discolored mucus that is not improving.  Saline nasal rinses As needed  Please contact office for sooner follow up if symptoms do not improve or worsen or seek emergency care  follow up Dr. Marchelle Gearing as planned and As needed   OV 08/01/2011  D 23 of 28 biaxin. Blocked sinus. Sinus pressure. Hx of eustachian tube grommets. No resp issues. Netti pot not working - too blocked. Denies fever, sputum, resp exacerbation, wheeze, edema, orthopnea, paroxysmal nocturnal dyspnea    Currently lungs appear ok  Our coordinator will find out who saaw you in [place]  [facility]. We will try to have you seen this week  REturn in 3 months or sooner if needed  Please continue with your medications  OV 10/30/2011  Sinus drainage copious - intolerant to netti pot  - burns, does not want nasal steroids or anti histamine. Wants to go back to ENT. This is making tickle in throat that is severe x 1 week with mucus stuck in throat and gag. CT abdomen lung cut April 2013 for abdominal pain shows clear lung fields with some basal atelectasis. Do not see any basal bronchiectasis  Past, Family, Social reviewed: gi issues, gi scope pending  Currently lungs  appear ok  Our nurse will have you see Dr in [facility] asap for sinus Issues  Take care of your gi issues  REturn in 6 months or sooner if needed  Please continue with your medications  #Overweight  - Anderson Malta will show you the [facility] lipid diet sheet and how to use it  #Followup  - 6 - 9 months or sooner if needed  - spirometry at followup   OV 04/29/2012  Overall ok. Younger sister in [place] died. She is sad. She is having  1 week of increased cough with chest tightness but no sputum or fever. Is asociated with scratchy throat. She feels antibiptics indicated because she wants to make it to funeral. She wants prednisone on standby. No other new issues. Has seen ENT  08/27/2012 Acute OV  Complains of having nasal congestion, wheezing, prod cough at times w green/yellow mucus x2-3 weeks. Has been using netti pot and flonase and symbicort .  No hemoptysis, chest pain , edema or n/v.   Doxycyline 100mg  Twice daily For 7 days  Mucinex DM Twice daily As needed Cough/congestion  Fluids and rest  Saline nasal rinses As needed  Please contact office for sooner follow up if symptoms do not improve or worsen or seek emergency care  follow up Dr. Chase Caller as planned and As needed   OV 11/10/2012  Followup bronchiectasis not otherwise specified.  - Overall doing well. The past few weeks for the allergy season is having increased sinus symptoms. This also corresponds to the fact that she has not taken her Flonase as advised. For the last couple of days she feels like she is getting a bronchitis infection with yellow phlegm. She feels associated fatigue. She is scheduled trip to [place] to see her sister but she feels that she cannot make it and would prefer not to go. Mild increase in wheezing but otherwise no problems. She is compliant with Symbicort. No fever no hemoptysis   Please have PFT and CT chest  Will call with results  Will also refer to pulmonary rehab  FU in3-4 months   OV  03/10/2013 FU bronchiectasis. Here to discuss PFT and CT results    Pft 01/29/13 is similar to 2010 : fvc 1.9L/65%, fev1 1.66L/74%, ratio86/116%, No BD resopnse. TLC 80%, DLCO 72%. Mild restriction. No change sinc 2010. HAs Mixed REstriction-Obstruction with mild reduced DLCO    CT chest 01/30/13 1. Bronhciectasis - real mild and limited to right upper lobe only.   2. Evidence of asthma /obstructive lung disease on CT.   - Inspiratory and  expiratory imaging demonstrates a profound mosaic appearance of the lung parenchyma on the expiratory phase with areas of lucency interspersed with areas of ground-glass attenuation with intervening geographic margins, compatible with severe air  trapping, suggesting small airways disease. In addition, during expiratory phase imaging, the  trachea and mainstem bronchi appear slightly collapsed, which may indicate a mild tracheobronchomalacia.     3. Lung nodule seen   - LUL 1 x 1.6cm. GGO with solid component.   - 4 mm ground-glass attenuation  nodule in the left upper lobe (image 22 of series 5)  4. Coronary artery atherosclrosis seen - she denies having had cardiac stress test in past; due to see dr Caryl Comes 03/12/13 per hx  #Coronary ARtery Atherosclerosis  - please see dr Caryl Comes and discuss having cardiac stress test  #Bronchiectasis and Obstructive Lung disease  - no evidence of flare; so stop doxycycline  - have flu shot 03/10/2013  - continue symbicort  - try tudorza 1 puff twice daily - take several samples  #Left lung upper lobe nodule 1.6cm   - there is intermediate probability that this is slow growing lung cancer called BAC  - will have [employer] XPRES[employer] blood test call you and do test; test helpful if negative - INDETER<OMATE  - REpeat CT chest wo contrast first week Nov 2014  - might order Oncimmune lab test and PET scan depending on fu scan in Nov 2014  #SInus drainage  - take some sample of nasal steroid  - use 3% hypertonic saline spray -  OTC by a company called [employer] 06/08/2013  Here to discuss results. Overall stable  CT chestr Nov 2014 CT chest COMPARISON: 01/30/2013.  FINDINGS:  No pathologically enlarged mediastinal or axillary lymph nodes.  Hilar regions are difficult to definitively evaluate without IV  contrast but appear grossly unremarkable. Pulmonary arteries are  borderline enlarged. Heart is enlarged. Atherosclerotic  calcification of the arterial vasculature with involvement of the  proximal left coronary artery. No pericardial effusion.  Biapical pleural parenchymal scarring. There is an upper lobe  predominant pattern of subpleural reticulation, patchy ground-glass  and mild architectural distortion. Scattered mild bronchiectasis.  Confluent airspace opacities seen in the upper lobes on the prior  exam appear less prominent but not resolved today (example image  15). Calcified granuloma in the lingula. No pleural fluid. Airway is  unremarkable.  Incidental imaging of the upper abdomen shows no acute findings. A  peripherally calcified splenic artery aneurysm measures 9 mm, as  before. No worrisome lytic or sclerotic lesions. Degenerative  changes are seen in the spine.  IMPRESSION:  1. Mild subpleural reticulation, patchy ground-glass, scattered  bronchiectasis and architectural distortion in the upper lobes,  somewhat evolved from 01/30/2013, but without a discrete pulmonary  nodule. 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  2. Splenic artery aneurysm, stable.  Electronically Signed  By: Lorin Picket M.D.  On: 05/25/2013 17:32    REC #Lung nodule with post inflammatory fibrosis - CMA will print copy of NOv  2014 CT report  - resolved nodule but for fibrosis part do CT chest in 5 months  - High Resolution CT chest without contrast on ILD protocol. Only  Dr Lorin Picket or Dr. Vinnie Langton to read  #Bronchiectasis  - stable   Continue  inhalers   OV 07/06/2013 - ACUTE VISIT  Apparently 2 months ago Dr Caryl Comes cardiologist changed some of her heart(A Fib ) med. THen over the holidays was in [facility]. On 06/29/13 had acute rise in BP associated wiuth ddyspnea and feeling clammy. REview of her hx suggests normal cath and a diagnosis of DIastolic CHF presumably. Diuresed and she is well now. SHe is emotpinally upset over whole incident and presents  with her daughter for review. SHe denies pumopnary issues. Hs fu with cards next few days  Past, Family, Social reviewed: no change since last visit  SOunds like you had Diastolic Heart Failure 08/67/61 in [place] This might have been related to medication change, salt intake, high bp Please follow with cardiology office   #FOllowup 4-5 months from now at time of next CT chest which should be on our schedule   OV 10/12/2013  Chief Complaint  Patient presents with  . Acute Visit    Pt c/o having a productive cough with green phlegm, sinus congestion as well x  on and off x 3 months. Pt was prescribed omnicef on 10-09-13, she took 3 tablets of this and stopped due to fear of allergy.     Follow bronchiectasis  She saw my partner Dr. Legrand Como wert on 09/18/2013 acutely. Lab work showed mild eosinophilia of 300 cells per microliter but otherwise normal. She also had respiratory allergen profile that was normal. She was given a prednisone taper but apparently when she finishes her symptoms got worse. Then on 10/09/2013 she apparently had orange  sputum and Omnicef was called in. She took this for 3 days and had improvement in her symptoms and her sputum is now back to baseline mild green in color and volume. However, she scared of taking more Omnicef because of prior allergies with rash to penicillin. Nevertheless she does have some wheezing ongoing  Review of Systems  Constitutional: Negative for fever and unexpected weight change.  HENT: Positive for congestion. Negative for dental problem,  ear pain, nosebleeds, postnasal drip, rhinorrhea, sinus pressure, sneezing, sore throat and trouble swallowing.   Eyes: Negative for redness and itching.  Respiratory: Positive for cough. Negative for chest tightness, shortness of breath and wheezing.   Cardiovascular: Negative for palpitations and leg swelling.  Gastrointestinal: Negative for nausea and vomiting.  Genitourinary: Negative for dysuria.  Musculoskeletal: Negative for joint swelling.  Skin: Negative for rash.  Neurological: Negative for headaches.  Hematological: Does not bruise/bleed easily.  Psychiatric/Behavioral: Negative for dysphoric mood. The patient is not nervous/anxious.        Objective:   Physical Exam   amb wf nad mod hoarse with rambling speech  Wt Readings from Last 3 Encounters:  09/23/13 182 lb (82.555 kg)  08/12/13 183 lb 6.4 oz (83.19 kg)  08/11/13 183 lb 12.8 oz (83.371 kg)     HEENT: nl dentition, turbinates, and orophanx. Nl external ear canals without cough reflex   NECK :  without JVD/Nodes/TM/ nl carotid upstrokes bilaterally   LUNGS: no acc muscle use, clear to A and P bilaterally without cough on insp or exp maneuvers. SOME WHEEZE + -= NEW   CV:  RRR  no s3 or murmur or increase in P2, no edema   ABD:  soft and nontender with nl excursion in the supine position. No bruits or organomegaly, bowel sounds nl  MS:  warm without deformities, calf tenderness, cyanosis or clubbing  SKIN: warm and dry without lesions    NEURO:  alert, approp, no deficits       Assessment & Plan:

## 2013-10-12 NOTE — Patient Instructions (Signed)
Finish 2 more days of omnicef Please take prednisone 40 mg x1 day, then 30 mg x1 day, then 20 mg x1 day, then 10 mg x1 day, and then 5 mg x1 day and stop  REturn May 2015 after Ct chest based on prior schedule

## 2013-10-14 ENCOUNTER — Other Ambulatory Visit: Payer: Self-pay | Admitting: Internal Medicine

## 2013-10-14 NOTE — Telephone Encounter (Signed)
Faxed script back [facility].../lmb 

## 2013-10-18 NOTE — Assessment & Plan Note (Signed)
Finish 2 more days of omnicef Please take prednisone 40 mg x1 day, then 30 mg x1 day, then 20 mg x1 day, then 10 mg x1 day, and then 5 mg x1 day and stop  REturn May 2015 after Ct chest based on prior schedule 

## 2013-10-19 ENCOUNTER — Telehealth: Payer: Self-pay | Admitting: Internal Medicine

## 2013-10-19 NOTE — Telephone Encounter (Signed)
Called pt and made her an appt to be seen tomorrow as she started Prednisone on April 17th and also instructed pt to call her dentist to be sure does not need to hold coumadin for a crown and she states understanding

## 2013-10-19 NOTE — Telephone Encounter (Signed)
New message     Pt need a crown on 10-21-13.  She is on coumadin.  Should she stay off coumadin

## 2013-10-19 NOTE — Telephone Encounter (Signed)
New message     Pt is on prednisone--will be finished tomorrow--started on 10-16-13.  She was on prednisone for lung congestion.

## 2013-10-20 ENCOUNTER — Ambulatory Visit (INDEPENDENT_AMBULATORY_CARE_PROVIDER_SITE_OTHER): Payer: BC Managed Care – PPO | Admitting: *Deleted

## 2013-10-20 DIAGNOSIS — I4891 Unspecified atrial fibrillation: Secondary | ICD-10-CM

## 2013-10-20 DIAGNOSIS — Z5181 Encounter for therapeutic drug level monitoring: Secondary | ICD-10-CM

## 2013-10-20 LAB — POCT INR: INR: 2.4

## 2013-11-06 ENCOUNTER — Ambulatory Visit (INDEPENDENT_AMBULATORY_CARE_PROVIDER_SITE_OTHER): Payer: BC Managed Care – PPO | Admitting: Pharmacist

## 2013-11-06 DIAGNOSIS — Z5181 Encounter for therapeutic drug level monitoring: Secondary | ICD-10-CM | POA: Diagnosis not present

## 2013-11-06 DIAGNOSIS — I4891 Unspecified atrial fibrillation: Secondary | ICD-10-CM | POA: Diagnosis not present

## 2013-11-06 LAB — POCT INR: INR: 1.7

## 2013-11-09 ENCOUNTER — Telehealth: Payer: Self-pay | Admitting: Internal Medicine

## 2013-11-09 NOTE — Telephone Encounter (Signed)
Stephanie Cortez has been obtained pt is aware [name]

## 2013-11-09 NOTE — Telephone Encounter (Signed)
Spoke with pt  She states that it was [employer]  She is going to mail all of the information to MR's attn  Nothing further needed today

## 2013-11-09 NOTE — Telephone Encounter (Signed)
Called and spoke with pt and she stated that she was seen by MR in sept 2014.  She stated that MR recs that she have the oncimmune and she did this.  She stated that she is now getting a bill from her insurance company that this will cost her $7500.  She stated that they are trying to appeal this with her insurance company.  Pt stated that she is scheduled for CT scan on 5/26 and she will have this done.  Pt is wanting to know if there is anything else she should do.    Pt stated that at the end of may she will no longer have BCBS due to her husband will be retiring.  She is worried that this may affect them going after her insurance. Pt will mailing information to MR.  MR please advise. Thanks  Allergies  Allergen Reactions  . Biaxin [Clarithromycin] Nausea And Vomiting  . Flagyl [Metronidazole] Nausea And Vomiting  . Moxifloxacin     REACTION: weakness  . Pantoprazole Sodium     REACTION: rash  . Penicillins     REACTION: rash

## 2013-11-09 NOTE — Telephone Encounter (Signed)
It mus be for [employer] XPRES[employer] (not oncimmune). Please clarify with her? Nevertheless, yes please have her send the invoice/bill copy to me. I will contact [place] area rep to work on waiver  Thanks  Dr. Brand Males, M.D., F.C.C.P Pulmonary and Critical Care Medicine Staff Physician [facility] Pager: [phone], If no answer or between  15:00h - 7:00h: call [phone]  11/09/2013 3:00 PM

## 2013-11-24 ENCOUNTER — Ambulatory Visit (INDEPENDENT_AMBULATORY_CARE_PROVIDER_SITE_OTHER): Payer: BC Managed Care – PPO | Admitting: Pharmacist Clinician (PhC)/ Clinical Pharmacy Specialist

## 2013-11-24 ENCOUNTER — Other Ambulatory Visit: Payer: Self-pay | Admitting: Internal Medicine

## 2013-11-24 ENCOUNTER — Ambulatory Visit (INDEPENDENT_AMBULATORY_CARE_PROVIDER_SITE_OTHER)
Admission: RE | Admit: 2013-11-24 | Discharge: 2013-11-24 | Disposition: A | Discharge status: Home / Self Care | Payer: BC Managed Care – PPO | Source: Ambulatory Visit | Attending: Internal Medicine | Admitting: Internal Medicine

## 2013-11-24 ENCOUNTER — Telehealth: Payer: Self-pay | Admitting: Internal Medicine

## 2013-11-24 DIAGNOSIS — I4891 Unspecified atrial fibrillation: Secondary | ICD-10-CM

## 2013-11-24 DIAGNOSIS — Z5181 Encounter for therapeutic drug level monitoring: Secondary | ICD-10-CM | POA: Diagnosis not present

## 2013-11-24 DIAGNOSIS — J479 Bronchiectasis, uncomplicated: Secondary | ICD-10-CM | POA: Diagnosis not present

## 2013-11-24 LAB — POCT INR: INR: 1.6

## 2013-11-24 NOTE — Telephone Encounter (Signed)
Patient returning Dr Golden Pop call Pt states that Dr Chase Caller called her this morning and patient missed call. Call was in regards to a bill that she received for a blood test that she has done in the Fall time.  Pt states that she is going to have her CT chest today at [facility] location. Pt scheduled for f/u appt with MR 12/09/13 at 3pm to review CT Scan.  Pt would like this message to be passed along to MR- pt received message that MR left on her voicemail and she sends her "thanks", pt states that she does not need a phone call back unless there was something in addition that MR needed to speak with her about.

## 2013-11-25 NOTE — Telephone Encounter (Signed)
Patient last seen 08/12/2013 Med last filled 10/14/13 #90

## 2013-11-26 DIAGNOSIS — F411 Generalized anxiety disorder: Secondary | ICD-10-CM | POA: Diagnosis not present

## 2013-11-26 DIAGNOSIS — I4891 Unspecified atrial fibrillation: Secondary | ICD-10-CM | POA: Diagnosis not present

## 2013-11-26 DIAGNOSIS — E785 Hyperlipidemia, unspecified: Secondary | ICD-10-CM | POA: Diagnosis not present

## 2013-11-26 DIAGNOSIS — I10 Essential (primary) hypertension: Secondary | ICD-10-CM | POA: Diagnosis not present

## 2013-12-08 ENCOUNTER — Ambulatory Visit (INDEPENDENT_AMBULATORY_CARE_PROVIDER_SITE_OTHER): Payer: Medicare Other | Admitting: *Deleted

## 2013-12-08 DIAGNOSIS — I4891 Unspecified atrial fibrillation: Secondary | ICD-10-CM | POA: Diagnosis not present

## 2013-12-08 DIAGNOSIS — Z5181 Encounter for therapeutic drug level monitoring: Secondary | ICD-10-CM

## 2013-12-08 LAB — POCT INR: INR: 2.5

## 2013-12-09 ENCOUNTER — Encounter: Payer: Self-pay | Admitting: Internal Medicine

## 2013-12-09 ENCOUNTER — Ambulatory Visit (INDEPENDENT_AMBULATORY_CARE_PROVIDER_SITE_OTHER): Payer: Medicare Other | Admitting: Internal Medicine

## 2013-12-09 VITALS — BP 154/80 | HR 97 | Ht 66.0 in | Wt 188.4 lb

## 2013-12-09 DIAGNOSIS — J479 Bronchiectasis, uncomplicated: Secondary | ICD-10-CM

## 2013-12-09 DIAGNOSIS — R911 Solitary pulmonary nodule: Secondary | ICD-10-CM | POA: Diagnosis not present

## 2013-12-09 DIAGNOSIS — I2510 Atherosclerotic heart disease of native coronary artery without angina pectoris: Secondary | ICD-10-CM | POA: Diagnosis not present

## 2013-12-09 NOTE — Patient Instructions (Addendum)
I will see you back in 6 months I will talk to radiologist and get back to you with followup plan on CT

## 2013-12-09 NOTE — Progress Notes (Signed)
Subjective:    Patient ID: Stephanie Cortez, female    DOB: 15-Jul-1933, 80 y.o.   MRN: [id number]  HPI   80 yo WF with known hx of Bronchietasis  Follouwp Bronchiectasis. Last ollowup Bronchiectasis. Last seen August 2010, Dec 2010, Jan 2011, FEb 2011 (AE bronciectasis), and  Oct 2011. S/p pulmonary rehab in summer 2011.  Since then had one more visit in Nov 2011 for  AE bronchiectasis. Since then doing well. Denies cough or dyspnea but feels chest is just a bit more tight than usual. . No other specific complaints.  Denies chest pain, dyspnea, orthopnea, hemoptysis, fever, n/v/d, edema, headache.  However, does admit to increased fatigue  + . Uses symbicort as needed which is around 2-3 times per week. Is requesting spirometry  >>rx  symbicort daily    OV 04/12/11:  Using symbicort prn and overall doing well. Only has minimal wheeze. She is anticipatory and pro-acitve in care. When sinus congestion develops uses netti pot and lemon-honey vapors for relief. This prevents wheezing. Typically winter is when she has AE bronchiectasis. Wants to know if she should take prn symbicort or go on daily.  >>no changes   05/23/2011 Acute OV  Complains of head congestion, clear mucus production, PND, chest congestion, cough x1week .  Taking mucinex with some help. Mucus is all clear. No fever or discolored mucus.  Worried that she will be sick over the holidays and she is leaving to go out of town.  She is using netti pot with good relief. She has increased her symbicort Twice daily  With decreased cough.  >>rx doxycycline  11/29 /2012 Follow up  Pt returns for follow up . She is feeling is better. Currently  taking doxycycline.  reports breathing has improved but still having sinuscongestion and drainage. Last ov she was given Augmentin -unfortunately has hx of PCN allergy and abx was changed prior to taking. She is tolerating abx well. Congestion and cough are much better.  No fever or chest pain.    >>No changes   06/19/2011 Acute OV Complains of wheezing, head congestion with green drainage, hacky cough x3-4days. OTC not helping. Using Netti pot rinses .  No hemotpysis . Worse in am and late at night.  Cough is getting  Worse. Does not want to be sick for the holidays-has too much to do.  Under a lot of stress with sister that is ill. Traveling back and forth to [place].     Past Med: has changed PMD to Dr Etter Sjogren.  Social: taking care of elder sister in her 79s who has now become dependent Fam hx: as in soccial. No other change   REC Continue on Symbicort 2 puffs Twice daily Until back to your baseline  Mucinex Twice daily As needed Cough/congestion  Fluids and rest  I have given you a prescription for antibiotic--Zpack To have on hold in case  You developed discolored mucus that is not improving.  Saline nasal rinses As needed  Please contact office for sooner follow up if symptoms do not improve or worsen or seek emergency care  follow up Dr. Chase Caller as planned and As needed   OV 08/01/2011  D 23 of 28 biaxin. Blocked sinus. Sinus pressure. Hx of eustachian tube grommets. No resp issues. Netti pot not working - too blocked. Denies fever, sputum, resp exacerbation, wheeze, edema, orthopnea, paroxysmal nocturnal dyspnea    Currently lungs appear ok  Our coordinator will find out who saaw you in  [facility]. We will try to have you seen this week  REturn in 3 months or sooner if needed  Please continue with your medications  OV 10/30/2011  Sinus drainage copious - intolerant to netti pot  - burns, does not want nasal steroids or anti histamine. Wants to go back to ENT. This is making tickle in throat that is severe x 1 week with mucus stuck in throat and gag. CT abdomen lung cut April 2013 for abdominal pain shows clear lung fields with some basal atelectasis. Do not see any basal bronchiectasis  Past, Family, Social reviewed: gi issues, gi scope pending  Currently lungs  appear ok  Our nurse will have you see Dr in [facility] asap for sinus Issues  Take care of your gi issues  REturn in 6 months or sooner if needed  Please continue with your medications  #Overweight  - Anderson Malta will show you the [facility] lipid diet sheet and how to use it  #Followup  - 6 - 9 months or sooner if needed  - spirometry at followup   OV 04/29/2012  Overall ok. Younger sister in [place] died. She is sad. She is having  1 week of increased cough with chest tightness but no sputum or fever. Is asociated with scratchy throat. She feels antibiptics indicated because she wants to make it to funeral. She wants prednisone on standby. No other new issues. Has seen ENT  08/27/2012 Acute OV  Complains of having nasal congestion, wheezing, prod cough at times w green/yellow mucus x2-3 weeks. Has been using netti pot and flonase and symbicort .  No hemoptysis, chest pain , edema or n/v.   Doxycyline 100mg  Twice daily For 7 days  Mucinex DM Twice daily As needed Cough/congestion  Fluids and rest  Saline nasal rinses As needed  Please contact office for sooner follow up if symptoms do not improve or worsen or seek emergency care  follow up Dr. Chase Caller as planned and As needed   OV 11/10/2012  Followup bronchiectasis not otherwise specified.  - Overall doing well. The past few weeks for the allergy season is having increased sinus symptoms. This also corresponds to the fact that she has not taken her Flonase as advised. For the last couple of days she feels like she is getting a bronchitis infection with yellow phlegm. She feels associated fatigue. She is scheduled trip to [place] to see her sister but she feels that she cannot make it and would prefer not to go. Mild increase in wheezing but otherwise no problems. She is compliant with Symbicort. No fever no hemoptysis   Please have PFT and CT chest  Will call with results  Will also refer to pulmonary rehab  FU in3-4 months   OV  03/10/2013 FU bronchiectasis. Here to discuss PFT and CT results    Pft 01/29/13 is similar to 2010 : fvc 1.9L/65%, fev1 1.66L/74%, ratio86/116%, No BD resopnse. TLC 80%, DLCO 72%. Mild restriction. No change sinc 2010. HAs Mixed REstriction-Obstruction with mild reduced DLCO    CT chest 01/30/13 1. Bronhciectasis - real mild and limited to right upper lobe only.   2. Evidence of asthma /obstructive lung disease on CT.   - Inspiratory and  expiratory imaging demonstrates a profound mosaic appearance of the lung parenchyma on the expiratory phase with areas of lucency interspersed with areas of ground-glass attenuation with intervening geographic margins, compatible with severe air  trapping, suggesting small airways disease. In addition, during expiratory phase imaging,  the trachea and mainstem bronchi appear slightly collapsed, which may indicate a mild tracheobronchomalacia.     3. Lung nodule seen   - LUL 1 x 1.6cm. GGO with solid component.   - 4 mm ground-glass attenuation  nodule in the left upper lobe (image 22 of series 5)  4. Coronary artery atherosclrosis seen - she denies having had cardiac stress test in past; due to see dr Caryl Comes 03/12/13 per hx  #Coronary ARtery Atherosclerosis  - please see dr Caryl Comes and discuss having cardiac stress test  #Bronchiectasis and Obstructive Lung disease  - no evidence of flare; so stop doxycycline  - have flu shot 03/10/2013  - continue symbicort  - try tudorza 1 puff twice daily - take several samples  #Left lung upper lobe nodule 1.6cm   - there is intermediate probability that this is slow growing lung cancer called BAC  - will have [facility] blood test call you and do test; test helpful if negative - INDETER<OMATE  - REpeat CT chest wo contrast first week Nov 2014  - might order Oncimmune lab test and PET scan depending on fu scan in Nov 2014  #SInus drainage  - take some sample of nasal steroid  - use 3% hypertonic saline spray -  OTC by a company called [employer] 06/08/2013  Here to discuss results. Overall stable  CT chestr Nov 2014 CT chest COMPARISON: 01/30/2013.  FINDINGS:  No pathologically enlarged mediastinal or axillary lymph nodes.  Hilar regions are difficult to definitively evaluate without IV  contrast but appear grossly unremarkable. Pulmonary arteries are  borderline enlarged. Heart is enlarged. Atherosclerotic  calcification of the arterial vasculature with involvement of the  proximal left coronary artery. No pericardial effusion.  Biapical pleural parenchymal scarring. There is an upper lobe  predominant pattern of subpleural reticulation, patchy ground-glass  and mild architectural distortion. Scattered mild bronchiectasis.  Confluent airspace opacities seen in the upper lobes on the prior  exam appear less prominent but not resolved today (example image  15). Calcified granuloma in the lingula. No pleural fluid. Airway is  unremarkable.  Incidental imaging of the upper abdomen shows no acute findings. A  peripherally calcified splenic artery aneurysm measures 9 mm, as  before. No worrisome lytic or sclerotic lesions. Degenerative  changes are seen in the spine.  IMPRESSION:  1. Mild subpleural reticulation, patchy ground-glass, scattered  bronchiectasis and architectural distortion in the upper lobes,  somewhat evolved from 01/30/2013, but without a discrete pulmonary  nodule. 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  2. Splenic artery aneurysm, stable.  Electronically Signed  By: Lorin Picket M.D.  On: 05/25/2013 17:32    REC #Lung nodule with post inflammatory fibrosis - CMA will print copy of NOv  2014 CT report  - resolved nodule but for fibrosis part do CT chest in 5 months  - High Resolution CT chest without contrast on ILD protocol. Only  Dr Lorin Picket or Dr. Vinnie Langton to read  #Bronchiectasis  - stable   Continue  inhalers   OV 07/06/2013 - ACUTE VISIT  Apparently 2 months ago Dr Caryl Comes cardiologist changed some of her heart(A Fib ) med. THen over the holidays was in [facility]. On 06/29/13 had acute rise in BP associated wiuth ddyspnea and feeling clammy. REview of her hx suggests normal cath and a diagnosis of DIastolic CHF presumably. Diuresed and she is well now. SHe is emotpinally upset over whole incident and  presents with her daughter for review. SHe denies pumopnary issues. Hs fu with cards next few days  Past, Family, Social reviewed: no change since last visit  SOunds like you had Diastolic Heart Failure 63/87/56 in [place] This might have been related to medication change, salt intake, high bp Please follow with cardiology office   #FOllowup 4-5 months from now at time of next CT chest which should be on our schedule   OV 10/12/2013  Chief Complaint  Patient presents with  . Acute Visit    Pt c/o having a productive cough with green phlegm, sinus congestion as well x  on and off x 3 months. Pt was prescribed omnicef on 10-09-13, she took 3 tablets of this and stopped due to fear of allergy.     Follow bronchiectasis  She saw my partner Dr. Legrand Como wert on 09/18/2013 acutely. Lab work showed mild eosinophilia of 300 cells per microliter but otherwise normal. She also had respiratory allergen profile that was normal. She was given a prednisone taper but apparently when she finishes her symptoms got worse. Then on 10/09/2013 she apparently had orange  sputum and Omnicef was called in. She took this for 3 days and had improvement in her symptoms and her sputum is now back to baseline mild green in color and volume. However, she scared of taking more Omnicef because of prior allergies with rash to penicillin. Nevertheless she does have some wheezing ongoing   Finish 2 more days of omnicef Please take prednisone 40 mg x1 day, then 30 mg x1 day, then 20 mg x1 day, then 10 mg x1 day, and then 5 mg x1 day  and stop REturn May 2015 after Ct chest based on prior schedule   OV 12/09/2013  Chief Complaint  Patient presents with  . Follow-up    Pt states her breathing has improved. Pt here to review HRCT. Pt c/o minimal dry cough and minimal SOB. Denies CP.     No major issues. Lot of issues at home with husband and troubled > 10 years of marriage. She is worried pulmonary issues will impact her life (same as before) but says overall she is stable.    has a past medical history of Bronchiectasis; Dyslipidemia; Eczema; Anemia; Atrial fibrillation; Glaucoma; Back pain (2009); Anxiety; Diverticulosis; COPD (chronic obstructive pulmonary disease); Hypertension; Arthritis; IBS (irritable bowel syndrome); H. pylori infection; GERD with stricture; Hyponatremia; Hypothyroid; DDD (degenerative disc disease), lumbar; and CHF (congestive heart failure).   reports that she has never smoked. She has never used smokeless tobacco.     CLINICAL DATA: Bronchiectasis, postinflammatory fibrosis.  EXAM:  CT CHEST WITHOUT CONTRAST 11/25/13 TECHNIQUE:  Multidetector CT imaging of the chest was performed following the  standard protocol without intravenous contrast. High resolution  imaging of the lungs, as well as inspiratory and expiratory imaging,  was performed.  COMPARISON: 05/25/2013, 01/30/2013.  FINDINGS:  Mediastinal lymph nodes are not enlarged by CT size criteria. Hilar  regions are difficult to definitively evaluate without IV contrast  but appear grossly unremarkable. No axillary adenopathy. Coronary  artery calcification. Heart is enlarged with predominantly right  atrial enlargement. Decreased attenuation of the intravascular  compartment is indicative of anemia. No pericardial effusion.  There is a somewhat upper lung zone predominant pattern of scattered  patchy ground-glass with mild architectural distortion and traction  bronchiolectasis. Findings do not appear progressive from prior   exams. No honeycombing. Calcified granuloma in the lingula. There  may be subtle scattered air  trapping on inspiratory and expiratory  imaging. No pleural fluid. Airway is unremarkable.  Incidental imaging of the upper abdomen shows the visualized  portions of the liver and adrenal glands to be unremarkable. A 9 mm  peripherally calcified splenic artery aneurysm is unchanged.  Visualized portions of the spleen, pancreas and stomach are  otherwise grossly unremarkable. No upper abdominal adenopathy. No  worrisome lytic or sclerotic lesions. Degenerative changes are seen  in the spine.  IMPRESSION:  1. Upper lung zone predominant pattern of mild patchy ground-glass  with slight architectural distortion and traction bronchiolectasis,  unchanged from prior exams and indicative of either nonspecific  interstitial pneumonitis (NSIP) or postinflammatory fibrosis.  2. Splenic artery aneurysm, stable.  3. email from Dr Rosario Jacks: I" had to go back to [facility]'s report on 01/30/13 to find mention of a LUL nodule.  There is some mild scarring or fibrosis in that location (series 5, image 15).  I wouldn't do any further followup for it." Electronically Signed  By: Lorin Picket M.D.  On: 11/25/2013 13:07   Review of Systems  Constitutional: Negative for fever and unexpected weight change.  HENT: Negative for congestion, dental problem, ear pain, nosebleeds, postnasal drip, rhinorrhea, sinus pressure, sneezing, sore throat and trouble swallowing.   Eyes: Negative for redness and itching.  Respiratory: Positive for cough and shortness of breath. Negative for chest tightness and wheezing.   Cardiovascular: Negative for palpitations and leg swelling.  Gastrointestinal: Negative for nausea and vomiting.  Genitourinary: Negative for dysuria.  Musculoskeletal: Negative for joint swelling.  Skin: Negative for rash.  Neurological: Negative for headaches.  Hematological: Does not bruise/bleed easily.   Psychiatric/Behavioral: Negative for dysphoric mood. The patient is not nervous/anxious.        Objective:   Physical Exam   Filed Vitals:   12/09/13 1513  BP: 154/80  Pulse: 97  Height: 5\' 6"  (1.676 m)  Weight: 188 lb 6.4 oz (85.458 kg)  SpO2: 97%      HEENT: nl dentition, turbinates, and orophanx. Nl external ear canals without cough reflex   NECK :  without JVD/Nodes/TM/ nl carotid upstrokes bilaterally   LUNGS: no acc muscle use, clear to A and P bilaterally without cough on insp or exp maneuvers. No wheeze  CV:  RRR  no s3 or murmur or increase in P2, no edema   ABD:  soft and nontender with nl excursion in the supine position. No bruits or organomegaly, bowel sounds nl  MS:  warm without deformities, calf tenderness, cyanosis or clubbing  SKIN: warm and dry without lesions    NEURO:  alert, approp, no deficits       Assessment & Plan:  #Bronchiectasis with ILD NOS findings on CT  - stable.  - might consider repeat ct chest in a 1 year or so depending on clnical situation  #Nodule of left lung  - this has resolvd; will let her know   #followup  6 months

## 2013-12-10 ENCOUNTER — Encounter: Payer: Self-pay | Admitting: Internal Medicine

## 2013-12-14 ENCOUNTER — Ambulatory Visit (INDEPENDENT_AMBULATORY_CARE_PROVIDER_SITE_OTHER): Payer: Medicare Other | Admitting: Internal Medicine

## 2013-12-14 ENCOUNTER — Encounter: Payer: Self-pay | Admitting: Internal Medicine

## 2013-12-14 VITALS — BP 124/64 | HR 76 | Ht 65.5 in | Wt 193.2 lb

## 2013-12-14 DIAGNOSIS — K5730 Diverticulosis of large intestine without perforation or abscess without bleeding: Secondary | ICD-10-CM | POA: Diagnosis not present

## 2013-12-14 DIAGNOSIS — K5010 Crohn's disease of large intestine without complications: Secondary | ICD-10-CM | POA: Diagnosis not present

## 2013-12-14 DIAGNOSIS — K220 Achalasia of cardia: Secondary | ICD-10-CM

## 2013-12-14 DIAGNOSIS — I2510 Atherosclerotic heart disease of native coronary artery without angina pectoris: Secondary | ICD-10-CM | POA: Diagnosis not present

## 2013-12-14 MED ORDER — OMEPRAZOLE 20 MG PO CPDR: 20.0000 mg | DELAYED_RELEASE_CAPSULE | Freq: Every day | ORAL | Status: DC

## 2013-12-14 MED ORDER — MESALAMINE 1.2 G PO TBEC: 1200.0000 mg | DELAYED_RELEASE_TABLET | Freq: Two times a day (BID) | ORAL | Status: DC

## 2013-12-14 NOTE — Patient Instructions (Signed)
We have sent the following medications to your pharmacy for you to pick up at your convenience: Lialda and Omeprazole   Follow up in 6 months

## 2013-12-14 NOTE — Progress Notes (Signed)
Patient ID: Stephanie Cortez, female   DOB: 04-26-1934, 80 y.o.   MRN: [id number] HPI: Stephanie Cortez is a 80 year old female with a past medical history of atrial fibrillation on warfarin, bronchiectasis, segmental colitis in the left colon, diverticulosis, hypothyroidism, IBS, cricopharyngeal achalasia treated with Botox and managed by ENT hypertension who is seen in followup. She is here alone today. She was previously managed by Dr. Sharlett Iles and this is her first visit with me. She reports overall she is doing well. She has had about a week of increased left lower quadrant discomfort. This has not been associated with any change in bowel habit. She does have a history of constipation and other times loose stool. This has been somewhat chronic for her. She reports stress seems to worsen her left lower quadrant abdominal pain. She denies blood in her stool or melena. Swallowing has been not a problem for her since Botox injection performed by Dr. [name] with ENT. She reports her heartburn has been extremely well-controlled on omeprazole 20 mg daily. No nausea or vomiting. She is taking Lialda 1.2 g daily. No fevers or chills.  Colonoscopy performed 11/14/2011 showed left-sided diverticulosis without polyps. Attempted EGD the same day though the esophagus could not be intubated.  She has a remote history of H. pylori which was treated.  Past Medical History  Diagnosis Date  . Bronchiectasis     >PFT 07/13/2008 in [place] 1.9L/76%, FVC 2.45L/74, Ratio 79, TLC 121%, DLCO 64%  AE BRonchiectasis - Dec 2010.New Rx:  outpatient - Feb 2011 - Rx outpatient  . Dyslipidemia   . Eczema   . Anemia     - Hgb 9.7gm% on 07/13/2008 in [place] -  Hgg 129gm% wiht normal irone levsl and ferritin 10/27/2008 in [place] Recurrent otitis/sinusitis  . Atrial fibrillation     chronic anticoag  . Glaucoma   . Back pain 2009  . Anxiety     chronic BZ prn  . Diverticulosis   . COPD (chronic obstructive pulmonary disease)     bronchiectasis  . Hypertension   . Arthritis   . IBS (irritable bowel syndrome)   . H. pylori infection   . GERD with stricture   . Hyponatremia     chronic, s/p endo eval 06/2012  . Hypothyroid   . DDD (degenerative disc disease), lumbar     MRI 08/2012 - working with NSurg  . CHF (congestive heart failure)     Past Surgical History  Procedure Laterality Date  . Cataract extraction      both  . Foot surgery  03/14/2011    gastroc slide-rt  . Total abdominal hysterectomy    . Colonoscopy    . Breast surgery      br bx  . Esophagoscopy w/ botox injection  12/11/2011    Procedure: ESOPHAGOSCOPY WITH BOTOX INJECTION;  Surgeon: Rozetta Nunnery, MD;  Location: [facility];  Service: ENT;  Laterality: N/A;  esophogoscopy with dilation, botox injection  . Cardiac catheterization      Current Outpatient Prescriptions  Medication Sig Dispense Refill  . budesonide-formoterol (SYMBICORT) 80-4.5 MCG/ACT inhaler Inhale 2 puffs into the lungs 2 (two) times daily.  1 Inhaler  5  . cetirizine (ZYRTEC) 10 MG tablet Take 10 mg by mouth daily as needed.       . Cholecalciferol (VITAMIN D PO) Take 1 tablet by mouth daily.      . Cyanocobalamin (VITAMIN B-12 PO) Take 1 tablet by mouth daily.      [name] Kitchen  FLUoxetine (PROZAC) 10 MG capsule Take 1 capsule (10 mg total) by mouth daily.  90 capsule  1  . furosemide (LASIX) 20 MG tablet Take 1 tablet (20 mg total) by mouth daily as needed.  30 tablet  1  . levothyroxine (SYNTHROID, LEVOTHROID) 50 MCG tablet Take 1 tablet (50 mcg total) by mouth daily.  90 tablet  3  . LORazepam (ATIVAN) 0.5 MG tablet TAKE 1 TABLET BY MOUTH EVERY 8 HOURS AS NEEDED FOR ANXIETY  90 tablet  3  . MAGNESIUM PO With Calcium, Takes 2 tbsp by mouth daily      . mesalamine (LIALDA) 1.2 G EC tablet Take 1 tablet (1.2 g total) by mouth 2 (two) times daily.  60 tablet  6  . Multiple Vitamins-Minerals (EMERGEN-C VITAMIN C PO) Take 1 tablet by mouth daily.      .  nebivolol (BYSTOLIC) 5 MG tablet Take 1 tablet (5 mg total) by mouth daily.  30 tablet  12  . olmesartan (BENICAR) 40 MG tablet Take 1 tablet (40 mg total) by mouth daily.  90 tablet  3  . omeprazole (PRILOSEC) 20 MG capsule Take 1 capsule (20 mg total) by mouth daily.  30 capsule  11  . Potassium (POTASSIMIN) 75 MG TABS Take 1 tablet by mouth daily. 1 tab po qd      . pravastatin (PRAVACHOL) 20 MG tablet TAKE 1/2 TABLET BY MOUTH DAILY  45 tablet  3  . pyridostigmine (MESTINON) 60 MG tablet Take 0.5 tablets (30 mg total) by mouth 2 (two) times daily.  30 tablet  12  . Respiratory Therapy Supplies (FLUTTER) DEVI Use as directed.  1 each  0  . selenium 50 MCG TABS Take 50 mcg by mouth daily.      [name] Kitchen warfarin (COUMADIN) 2.5 MG tablet take as directed      . Zinc 22.5 MG TABS Take by mouth daily.       No current facility-administered medications for this visit.    Allergies  Allergen Reactions  . Biaxin [Clarithromycin] Nausea And Vomiting  . Flagyl [Metronidazole] Nausea And Vomiting  . Moxifloxacin     REACTION: weakness  . Pantoprazole Sodium     REACTION: rash  . Penicillins     REACTION: rash    Family History  Problem Relation Age of Onset  . Hypertension Mother   . Stroke Mother   . Atrial fibrillation      siblings  . Lung cancer Brother   . Emphysema Brother   . Other Father     miner's lung  . Cancer Father     Lung  . Colon polyps Sister   . Pancreatic cancer Sister   . Kidney disease Sister     History  Substance Use Topics  . Smoking status: Never Smoker   . Smokeless tobacco: Never Used  . Alcohol Use: No    ROS: As per history of present illness, otherwise negative  BP 124/64  Pulse 76  Ht 5' 5.5" (1.664 m)  Wt 193 lb 4 oz (87.658 kg)  BMI 31.66 kg/m2 Constitutional: Well-developed and well-nourished. No distress. HEENT: Normocephalic and atraumatic.Conjunctivae are normal.  No scleral icterus. Neck: Neck supple. Trachea midline. Cardiovascular:  Normal rate, regular rhythm and intact distal pulses. No M/R/G Pulmonary/chest: Effort normal and breath sounds normal. No wheezing, rales or rhonchi. Abdominal: Soft, very mild left lower quadrant tenderness without rebound or guarding, nondistended. Bowel sounds active throughout.  Extremities: no clubbing, cyanosis, or  edema Neurological: Alert and oriented to person place and time. Psychiatric: Normal mood and affect. Behavior is normal.  RELEVANT LABS AND IMAGING: CBC    Component Value Date/Time   WBC 5.9 09/23/2013 1156   WBC 8.7 10/07/2012   RBC 4.11 09/23/2013 1156   HGB 12.9 09/23/2013 1156   HCT 39.1 09/23/2013 1156   PLT 239.0 09/23/2013 1156   MCV 95.3 09/23/2013 1156   MCH 32.3 04/07/2012 1450   MCHC 33.0 09/23/2013 1156   RDW 12.9 09/23/2013 1156   LYMPHSABS 1.4 09/23/2013 1156   MONOABS 0.6 09/23/2013 1156   EOSABS 0.3 09/23/2013 1156   BASOSABS 0.0 09/23/2013 1156    CMP     Component Value Date/Time   NA 134* 02/10/2013 1306   NA 130* 10/07/2012   K 4.4 02/10/2013 1306   CL 102 02/10/2013 1306   CO2 23 02/10/2013 1306   GLUCOSE 100* 02/10/2013 1306   BUN 10 02/10/2013 1306   BUN 8 10/07/2012   CREATININE 0.8 02/10/2013 1306   CREATININE 0.6 10/07/2012   CALCIUM 9.8 02/10/2013 1306   PROT 7.2 04/07/2012 1450   ALBUMIN 4.2 04/07/2012 1450   AST 28 10/07/2012   ALT 24 10/07/2012   ALKPHOS 87 10/07/2012   BILITOT 0.5 04/07/2012 1450   GFRNONAA 88* 04/07/2012 1450   GFRAA >90 04/07/2012 1450    ASSESSMENT/PLAN: 80 year old female with a past medical history of atrial fibrillation on warfarin, bronchiectasis, segmental colitis in the left colon, diverticulosis, hypothyroidism, IBS, cricopharyngeal achalasia treated with Botox and managed by ENT hypertension who is seen in followup.  1.  Segmental colitis/history of left-sided diverticulosis -- her current symptoms, mild left lower quadrant discomfort, worsened by stress, is felt secondary to irritable bowel flare versus flare of segmental  colitis associated with diverticulitis. She does not clinically have diverticulitis, and overall looks well. I have recommended increasing Lialda to 2.4 g daily until symptoms improve. She can then return to 1.2 g daily thereafter. I would like her to call me if her symptoms worsen or change in any way, and she voices understanding. She is happy with this plan.  2.  Cricopharyngeal achalasia -- excellent response to Botox injection. She can follow at Dr. [name] periodically as needed.  3.   CRC screening -- up-to-date with last colonoscopy May 2013. Will likely not require surveillance colonoscopy or repeat screening based on age. We can discuss this as time passes   Return in 6 months, sooner if necessary.

## 2013-12-20 DIAGNOSIS — J479 Bronchiectasis, uncomplicated: Secondary | ICD-10-CM | POA: Insufficient documentation

## 2013-12-22 ENCOUNTER — Ambulatory Visit (INDEPENDENT_AMBULATORY_CARE_PROVIDER_SITE_OTHER): Payer: Medicare Other | Admitting: *Deleted

## 2013-12-22 DIAGNOSIS — Z5181 Encounter for therapeutic drug level monitoring: Secondary | ICD-10-CM | POA: Diagnosis not present

## 2013-12-22 DIAGNOSIS — I4891 Unspecified atrial fibrillation: Secondary | ICD-10-CM | POA: Diagnosis not present

## 2013-12-22 LAB — POCT INR: INR: 2.6

## 2013-12-24 ENCOUNTER — Other Ambulatory Visit: Payer: Self-pay | Admitting: Internal Medicine

## 2014-01-10 ENCOUNTER — Telehealth: Payer: Self-pay | Admitting: Internal Medicine

## 2014-01-10 NOTE — Telephone Encounter (Signed)
Stephanie Cortez  Please tell Stephanie Cortez that per radiologist on may 2015 ct chest the lung nodule has resolved.   Thanks  Dr. Brand Males, M.D., [facility].C.P Pulmonary and Critical Care Medicine Staff Physician [facility] Pager: [phone], If no answer or between  15:00h - 7:00h: call [phone]  01/10/2014 5:55 PM

## 2014-01-13 NOTE — Telephone Encounter (Signed)
I spoke with patient about results and she verbalized understanding and had no questions 

## 2014-01-13 NOTE — Telephone Encounter (Signed)
Returning a call to [place] call her cell phone #

## 2014-01-13 NOTE — Telephone Encounter (Signed)
lmt cb on both number

## 2014-01-15 ENCOUNTER — Ambulatory Visit (INDEPENDENT_AMBULATORY_CARE_PROVIDER_SITE_OTHER): Payer: Medicare Other | Admitting: *Deleted

## 2014-01-15 DIAGNOSIS — I4891 Unspecified atrial fibrillation: Secondary | ICD-10-CM | POA: Diagnosis not present

## 2014-01-15 DIAGNOSIS — Z5181 Encounter for therapeutic drug level monitoring: Secondary | ICD-10-CM

## 2014-01-15 LAB — POCT INR: INR: 3.3

## 2014-02-02 ENCOUNTER — Ambulatory Visit (INDEPENDENT_AMBULATORY_CARE_PROVIDER_SITE_OTHER): Payer: Medicare Other | Admitting: Pharmacist Clinician (PhC)/ Clinical Pharmacy Specialist

## 2014-02-02 DIAGNOSIS — Z5181 Encounter for therapeutic drug level monitoring: Secondary | ICD-10-CM | POA: Diagnosis not present

## 2014-02-02 DIAGNOSIS — I4891 Unspecified atrial fibrillation: Secondary | ICD-10-CM

## 2014-02-02 LAB — POCT INR: INR: 3.3

## 2014-02-16 ENCOUNTER — Other Ambulatory Visit: Payer: Self-pay | Admitting: Internal Medicine

## 2014-02-17 ENCOUNTER — Other Ambulatory Visit: Payer: Self-pay | Admitting: Internal Medicine

## 2014-02-18 ENCOUNTER — Telehealth: Payer: Self-pay | Admitting: Internal Medicine

## 2014-02-18 NOTE — Telephone Encounter (Signed)
Called pt. Unable to leave message d/t mailbox being full. WCB.

## 2014-02-19 NOTE — Telephone Encounter (Signed)
LMTCB x 1 

## 2014-02-22 NOTE — Telephone Encounter (Addendum)
Called, spoke with pt - Per Chart, Symbicort 80 was sent to [facility] in [place] on 02/18/14. Pt states [facility] in [place] transferred the Symbicort rx sent on 02/18/[facility] [place]. Per pt, she has now picked rx up and nothing further is needed.

## 2014-02-25 ENCOUNTER — Other Ambulatory Visit: Payer: Self-pay | Admitting: Internal Medicine

## 2014-03-02 ENCOUNTER — Ambulatory Visit (INDEPENDENT_AMBULATORY_CARE_PROVIDER_SITE_OTHER): Payer: Medicare Other | Admitting: Pharmacist

## 2014-03-02 DIAGNOSIS — Z5181 Encounter for therapeutic drug level monitoring: Secondary | ICD-10-CM

## 2014-03-02 DIAGNOSIS — I4891 Unspecified atrial fibrillation: Secondary | ICD-10-CM

## 2014-03-02 LAB — POCT INR: INR: 2.2

## 2014-03-18 DIAGNOSIS — R42 Dizziness and giddiness: Secondary | ICD-10-CM | POA: Diagnosis not present

## 2014-03-18 DIAGNOSIS — Z23 Encounter for immunization: Secondary | ICD-10-CM | POA: Diagnosis not present

## 2014-03-18 DIAGNOSIS — M549 Dorsalgia, unspecified: Secondary | ICD-10-CM | POA: Diagnosis not present

## 2014-03-18 DIAGNOSIS — IMO0002 Reserved for concepts with insufficient information to code with codable children: Secondary | ICD-10-CM | POA: Diagnosis not present

## 2014-03-21 ENCOUNTER — Other Ambulatory Visit: Payer: Self-pay | Admitting: Internal Medicine

## 2014-03-30 ENCOUNTER — Encounter: Payer: Self-pay | Admitting: Internal Medicine

## 2014-03-30 ENCOUNTER — Ambulatory Visit (INDEPENDENT_AMBULATORY_CARE_PROVIDER_SITE_OTHER): Payer: Medicare Other | Admitting: Internal Medicine

## 2014-03-30 ENCOUNTER — Other Ambulatory Visit: Payer: Self-pay

## 2014-03-30 ENCOUNTER — Ambulatory Visit (INDEPENDENT_AMBULATORY_CARE_PROVIDER_SITE_OTHER): Payer: Medicare Other | Admitting: Pharmacist Clinician (PhC)/ Clinical Pharmacy Specialist

## 2014-03-30 VITALS — BP 142/70 | HR 73 | Ht 66.0 in | Wt 192.2 lb

## 2014-03-30 DIAGNOSIS — I4891 Unspecified atrial fibrillation: Secondary | ICD-10-CM

## 2014-03-30 DIAGNOSIS — Z5181 Encounter for therapeutic drug level monitoring: Secondary | ICD-10-CM

## 2014-03-30 DIAGNOSIS — I482 Chronic atrial fibrillation, unspecified: Secondary | ICD-10-CM

## 2014-03-30 DIAGNOSIS — I2510 Atherosclerotic heart disease of native coronary artery without angina pectoris: Secondary | ICD-10-CM

## 2014-03-30 LAB — POCT INR: INR: 1.7

## 2014-03-30 MED ORDER — PYRIDOSTIGMINE BROMIDE 60 MG PO TABS: ORAL_TABLET | ORAL | Status: DC

## 2014-03-30 MED ORDER — NEBIVOLOL HCL 5 MG PO TABS
5.0000 mg | ORAL_TABLET | Freq: Every day | ORAL | Status: DC
Start: 2014-03-30 — End: 2015-03-21

## 2014-03-30 MED ORDER — OLMESARTAN MEDOXOMIL 40 MG PO TABS: 40.0000 mg | ORAL_TABLET | Freq: Every day | ORAL | Status: DC

## 2014-03-30 MED ORDER — FUROSEMIDE 20 MG PO TABS: 20.0000 mg | ORAL_TABLET | Freq: Every day | ORAL | Status: DC | PRN

## 2014-03-30 MED ORDER — WARFARIN SODIUM 2.5 MG PO TABS: ORAL_TABLET | ORAL | Status: DC

## 2014-03-30 NOTE — Patient Instructions (Signed)
Your physician wants you to follow-up in: 6 months with Dr. Klein. You will receive a reminder letter in the mail two months in advance. If you don't receive a letter, please call our office to schedule the follow-up appointment.  Your physician recommends that you continue on your current medications as directed. Please refer to the Current Medication list given to you today.  

## 2014-03-30 NOTE — Progress Notes (Signed)
Patient Care Team: Rowe Clack, MD as PCP - General (Internal Medicine) Deboraha Sprang, MD (Cardiology) Sable Feil, MD (Gastroenterology) Brand Males, MD (Pulmonary Disease) Renato Shin, MD (Endocrinology) Erline Levine, MD (Neurosurgery) Colin Rhein, MD (Orthopedic Surgery) Rozetta Nunnery, MD (Otolaryngology)   HPI  Stephanie Cortez is a 80 y.o. female Seen in followup for long-standing atrial fibrillation dating back about 10 years. It is felt to be permanent having failed multiple cardioversions and antiarrhythmic drugs.   She was admitted   because of chest pain elevated blood pressure and confirmed atrial fibrillation with a rapid rate. She underwent a catheterization which was nonobstructive apparently. An echocardiogram was also normal except for left atrial enlargement. These records were reviewed. The chest x-ray report showed some pulmonary edema. Her BNP was 1900(upper limit 1800)   Overall she is doing pretty well. Dyspnea is stable. There is no significant edema.  Her sister remains ill assisted living in her husband has recently retired.   Past Medical History  Diagnosis Date  . Bronchiectasis     >PFT 07/13/2008 in [place] 1.9L/76%, FVC 2.45L/74, Ratio 79, TLC 121%, DLCO 64%  AE BRonchiectasis - Dec 2010.New Rx:  outpatient - Feb 2011 - Rx outpatient  . Dyslipidemia   . Eczema   . Anemia     - Hgb 9.7gm% on 07/13/2008 in [place] -  Hgg 129gm% wiht normal irone levsl and ferritin 10/27/2008 in [place] Recurrent otitis/sinusitis  . Atrial fibrillation     chronic anticoag  . Glaucoma   . Back pain 2009  . Anxiety     chronic BZ prn  . Diverticulosis   . COPD (chronic obstructive pulmonary disease)     bronchiectasis  . Hypertension   . Arthritis   . IBS (irritable bowel syndrome)   . H. pylori infection   . GERD with stricture   . Hyponatremia     chronic, s/p endo eval 06/2012  . Hypothyroid   . DDD (degenerative disc  disease), lumbar     MRI 08/2012 - working with NSurg  . CHF (congestive heart failure)     Past Surgical History  Procedure Laterality Date  . Cataract extraction      both  . Foot surgery  03/14/2011    gastroc slide-rt  . Total abdominal hysterectomy    . Colonoscopy    . Breast surgery      br bx  . Esophagoscopy w/ botox injection  12/11/2011    Procedure: ESOPHAGOSCOPY WITH BOTOX INJECTION;  Surgeon: Rozetta Nunnery, MD;  Location: [facility];  Service: ENT;  Laterality: N/A;  esophogoscopy with dilation, botox injection  . Cardiac catheterization      Current Outpatient Prescriptions  Medication Sig Dispense Refill  . cetirizine (ZYRTEC) 10 MG tablet Take 10 mg by mouth daily as needed.       . Cholecalciferol (VITAMIN D PO) Take 1 tablet by mouth daily.      . Cyanocobalamin (VITAMIN B-12 PO) Take 1 tablet by mouth daily.      [name] Kitchen FLUoxetine (PROZAC) 10 MG capsule TAKE ONE CAPSULE BY MOUTH EVERY DAY  90 capsule  0  . furosemide (LASIX) 20 MG tablet Take 1 tablet (20 mg total) by mouth daily as needed.  30 tablet  1  . levothyroxine (SYNTHROID, LEVOTHROID) 50 MCG tablet Take 1 tablet (50 mcg total) by mouth daily.  90 tablet  3  . LORazepam (  ATIVAN) 0.5 MG tablet TAKE 1 TABLET BY MOUTH EVERY 8 HOURS AS NEEDED FOR ANXIETY  90 tablet  3  . MAGNESIUM PO Take 1 tablet by mouth daily.       . mesalamine (LIALDA) 1.2 G EC tablet Take 1,200 mg by mouth as needed (for diverticulitis).      . Multiple Vitamins-Minerals (EMERGEN-C VITAMIN C PO) Take 1 tablet by mouth daily.      . nebivolol (BYSTOLIC) 5 MG tablet Take 1 tablet (5 mg total) by mouth daily.  30 tablet  12  . olmesartan (BENICAR) 40 MG tablet Take 1 tablet (40 mg total) by mouth daily.  90 tablet  3  . omeprazole (PRILOSEC) 20 MG capsule Take 1 capsule (20 mg total) by mouth daily.  30 capsule  11  . Potassium (POTASSIMIN) 75 MG TABS Take 1 tablet by mouth daily. 1 tab po qd      . pravastatin (PRAVACHOL)  20 MG tablet TAKE 1/2 TABLET BY MOUTH DAILY  45 tablet  3  . pyridostigmine (MESTINON) 60 MG tablet TAKE 1/2 TABLET BY MOUTH TWICE DAILY  30 tablet  0  . Respiratory Therapy Supplies (FLUTTER) DEVI Use as directed.  1 each  0  . selenium 50 MCG TABS Take 50 mcg by mouth daily.      . SYMBICORT 80-4.5 MCG/ACT inhaler INHALE 2 PUFFS INTO THE LUNGS TWICE DAILY  10.2 g  0  . warfarin (COUMADIN) 2.5 MG tablet 1 tablet everyday except 1/2 tablet on Mondays and Fridays or as directed by coumadin clinic  35 tablet  2  . Zinc 22.5 MG TABS Take by mouth daily.       No current facility-administered medications for this visit.    Allergies  Allergen Reactions  . Biaxin [Clarithromycin] Nausea And Vomiting  . Flagyl [Metronidazole] Nausea And Vomiting  . Moxifloxacin Other (See Comments)    REACTION: weakness  . Pantoprazole Sodium Other (See Comments)    REACTION: rash  . Penicillins Other (See Comments)    REACTION: rash    Review of Systems negative except from HPI and PMH  Physical Exam BP 142/70  Pulse 73  Ht 5\' 6"  (1.676 m)  Wt 192 lb 3.2 oz (87.181 kg)  BMI 31.04 kg/m2 Well developed and well nourished in no acute distress HENT normal E scleral and icterus clear Neck Supple JVP flat; carotids brisk and full Clear to ausculation Irregularly irregular rate and rhythm, 2/6 murmurs  Soft with active bowel sounds No clubbing cyanosis none Edema Alert and oriented, grossly normal motor and sensory function Skin Warm and Dry  ECG demonstrates atrial fibrillation at 72 Intervals-/08/38 Axis LI Otherwise normal  Assessment and  Plan  Atrial fibrillation-permanent  HFpEF  Hypertension   ,Euvolemic continue current meds  Bp reasonable  On anticoagulation

## 2014-04-02 DIAGNOSIS — M4806 Spinal stenosis, lumbar region: Secondary | ICD-10-CM | POA: Diagnosis not present

## 2014-04-02 DIAGNOSIS — M545 Low back pain: Secondary | ICD-10-CM | POA: Diagnosis not present

## 2014-04-05 DIAGNOSIS — Z961 Presence of intraocular lens: Secondary | ICD-10-CM | POA: Diagnosis not present

## 2014-04-05 DIAGNOSIS — H524 Presbyopia: Secondary | ICD-10-CM | POA: Diagnosis not present

## 2014-04-05 DIAGNOSIS — D3131 Benign neoplasm of right choroid: Secondary | ICD-10-CM | POA: Diagnosis not present

## 2014-04-05 DIAGNOSIS — H40013 Open angle with borderline findings, low risk, bilateral: Secondary | ICD-10-CM | POA: Diagnosis not present

## 2014-04-05 DIAGNOSIS — H1013 Acute atopic conjunctivitis, bilateral: Secondary | ICD-10-CM | POA: Diagnosis not present

## 2014-04-08 DIAGNOSIS — H40013 Open angle with borderline findings, low risk, bilateral: Secondary | ICD-10-CM | POA: Diagnosis not present

## 2014-04-13 ENCOUNTER — Ambulatory Visit (INDEPENDENT_AMBULATORY_CARE_PROVIDER_SITE_OTHER): Payer: Medicare Other

## 2014-04-13 DIAGNOSIS — I4891 Unspecified atrial fibrillation: Secondary | ICD-10-CM

## 2014-04-13 DIAGNOSIS — Z5181 Encounter for therapeutic drug level monitoring: Secondary | ICD-10-CM | POA: Diagnosis not present

## 2014-04-13 LAB — POCT INR: INR: 2.9

## 2014-04-16 DIAGNOSIS — M47816 Spondylosis without myelopathy or radiculopathy, lumbar region: Secondary | ICD-10-CM | POA: Diagnosis not present

## 2014-04-22 DIAGNOSIS — M4806 Spinal stenosis, lumbar region: Secondary | ICD-10-CM | POA: Diagnosis not present

## 2014-04-26 DIAGNOSIS — M4806 Spinal stenosis, lumbar region: Secondary | ICD-10-CM | POA: Diagnosis not present

## 2014-04-28 ENCOUNTER — Telehealth: Payer: Self-pay | Admitting: Internal Medicine

## 2014-04-28 NOTE — Telephone Encounter (Signed)
Pt called to see if she can be clear for a spinal epidural Cortisone shot at [facility] orthopedic. Pt state that the orthopedic doctor would like for pt to be off coumadin for 7 days prior the procedure. This procedure will be scheduled after clearance. Pt is very apprehensive to be off coumadin for so may days. Pt last O/V  With Dr. Caryl Comes was on 03/30/14. Pt. Is aware that this message will be send to MD for recommendations.

## 2014-04-28 NOTE — Telephone Encounter (Signed)
New Message      Pt calling stating she is needing surgical clearance to be off Coumadin for a procedure with [employer]. Pt has questions about being off coumadin for this procedure. Please call pt back to advise.

## 2014-04-30 NOTE — Telephone Encounter (Signed)
Follow up      Pt needs clearance to be off coumadin prior to spinal epidural shot.  She is scared to be off coumadin for 7 days.  She want to know what does Dr Caryl Comes think?

## 2014-04-30 NOTE — Telephone Encounter (Signed)
The pt called back and asked if I would get a message to Dr Caryl Comes asking him how he feels about her having these epidural injections for her back pain or if she should not get them.   She is also concerned about being off Coumadin for 7 days. I reassured her that Dr Caryl Comes will make the best decision as to how long she can safely be off of Coumadin.  She is requesting a comment from Dr Caryl Comes to these question (can be from nurse reporting Dr Olin Pia response) as she states that he is such a good doctor that she trust his judgement.  Please advise.

## 2014-05-01 NOTE — Telephone Encounter (Signed)
Her risks of coing off coumadin in the absence of prior storkes should be acceptable.  Hopefully this will help

## 2014-05-03 NOTE — Telephone Encounter (Signed)
Per Dr Caryl Comes the pt is advised that she may stop taking Coumadin for 7 days prior to epidural injection and that he will leave the decision as to whether or not the pt should get the injection to the pt to decide whats best for her. She verbalized understanding.

## 2014-05-10 ENCOUNTER — Ambulatory Visit (INDEPENDENT_AMBULATORY_CARE_PROVIDER_SITE_OTHER): Payer: Medicare Other | Admitting: *Deleted

## 2014-05-10 DIAGNOSIS — Z5181 Encounter for therapeutic drug level monitoring: Secondary | ICD-10-CM

## 2014-05-10 DIAGNOSIS — I4891 Unspecified atrial fibrillation: Secondary | ICD-10-CM | POA: Diagnosis not present

## 2014-05-10 LAB — POCT INR: INR: 3.4

## 2014-05-17 ENCOUNTER — Encounter: Payer: Self-pay | Admitting: Critical Care Medicine

## 2014-05-17 ENCOUNTER — Ambulatory Visit (INDEPENDENT_AMBULATORY_CARE_PROVIDER_SITE_OTHER): Payer: Medicare Other | Admitting: Critical Care Medicine

## 2014-05-17 ENCOUNTER — Other Ambulatory Visit: Payer: Self-pay | Admitting: *Deleted

## 2014-05-17 VITALS — BP 116/78 | HR 95 | Temp 98.4°F | Ht 65.5 in | Wt 199.2 lb

## 2014-05-17 DIAGNOSIS — I2510 Atherosclerotic heart disease of native coronary artery without angina pectoris: Secondary | ICD-10-CM

## 2014-05-17 DIAGNOSIS — J471 Bronchiectasis with (acute) exacerbation: Secondary | ICD-10-CM

## 2014-05-17 MED ORDER — METHYLPREDNISOLONE ACETATE 80 MG/ML IJ SUSP
80.0000 mg | Freq: Once | INTRAMUSCULAR | Status: AC
  Administered 2014-05-17: 80 mg via INTRAMUSCULAR

## 2014-05-17 MED ORDER — BUDESONIDE-FORMOTEROL FUMARATE 80-4.5 MCG/ACT IN AERO: INHALATION_SPRAY | RESPIRATORY_TRACT | Status: DC

## 2014-05-17 NOTE — Patient Instructions (Signed)
A depomedrol injection will be obtained 80mg  No change in medications, use your flutter valve 3-4 times daily, use your saline nasal 3 sprays three times daily Return as needed

## 2014-05-17 NOTE — Progress Notes (Signed)
Subjective:    Patient ID: Stephanie Cortez, female    DOB: 1934/05/18, 80 y.o.   MRN: [id number]  HPI   80 yo WF with known hx of Bronchietasis  05/17/2014 Chief Complaint  Patient presents with  . Acute Visit    MR pt - increased SOB, increased cough, and wheezing - worse qhs x 3 months.  Cough is prod with clear mucus.  No chest tightness/pain or f/c/s.  MR pt. Acute work in   ILD /bronchiectasis Pt notes more wheezing, worse at night, cough is more, min prod, severe spells. Cough day and night. Notes more dyspnea. Major stress issues. No fever. Notes  pndrip.  Flutter valve helps    Review of Systems  Constitutional: Negative for fever and unexpected weight change.  HENT: Negative for congestion, dental problem, ear pain, nosebleeds, postnasal drip, rhinorrhea, sinus pressure, sneezing, sore throat and trouble swallowing.   Eyes: Negative for redness and itching.  Respiratory: Positive for cough and shortness of breath. Negative for chest tightness and wheezing.   Cardiovascular: Negative for palpitations and leg swelling.  Gastrointestinal: Negative for nausea and vomiting.  Genitourinary: Negative for dysuria.  Musculoskeletal: Negative for joint swelling.  Skin: Negative for rash.  Neurological: Negative for headaches.  Hematological: Does not bruise/bleed easily.  Psychiatric/Behavioral: Negative for dysphoric mood. The patient is not nervous/anxious.        Objective:   Physical Exam  Filed Vitals:   05/17/14 0945  BP: 116/78  Pulse: 95  Temp: 98.4 F (36.9 C)  TempSrc: Oral  Height: 5' 5.5" (1.664 m)  Weight: 199 lb 3.2 oz (90.357 kg)  SpO2: 98%    Gen: Pleasant, well-nourished, in no distress,  normal affect  ENT: No lesions,  mouth clear,  oropharynx clear, no postnasal drip  Neck: No JVD, no TMG, no carotid bruits  Lungs: No use of accessory muscles, no dullness to percussion, expired wheezes  Cardiovascular: RRR, heart sounds normal, no murmur  or gallops, no peripheral edema  Abdomen: soft and NT, no HSM,  BS normal  Musculoskeletal: No deformities, no cyanosis or clubbing  Neuro: alert, non focal  Skin: Warm, no lesions or rashes  No results found.      Assessment & Plan:   Bronchiectasis with acute exacerbation Bronchiectasis with acute exacerbation that does not appear to be infectious in nature Plan Depo-Medrol injection 80 mg was given No change in other medications   Updated Medication List Outpatient Encounter Prescriptions as of 05/17/2014  Medication Sig  . cetirizine (ZYRTEC) 10 MG tablet Take 10 mg by mouth daily as needed.   . Cholecalciferol (VITAMIN D PO) Take 1 tablet by mouth daily.  . Cyanocobalamin (VITAMIN B-12 PO) Take 1 tablet by mouth daily.  [name] Kitchen FLUoxetine (PROZAC) 10 MG capsule TAKE ONE CAPSULE BY MOUTH EVERY DAY  . furosemide (LASIX) 20 MG tablet Take 1 tablet (20 mg total) by mouth daily as needed.  [name] Kitchen levothyroxine (SYNTHROID, LEVOTHROID) 50 MCG tablet Take 1 tablet (50 mcg total) by mouth daily.  [name] Kitchen LORazepam (ATIVAN) 0.5 MG tablet TAKE 1 TABLET BY MOUTH EVERY 8 HOURS AS NEEDED FOR ANXIETY  . MAGNESIUM PO Take 1 tablet by mouth daily.   . mesalamine (LIALDA) 1.2 G EC tablet Take 1,200 mg by mouth as needed (for diverticulitis).  . Multiple Vitamins-Minerals (EMERGEN-C VITAMIN C PO) Take 1 tablet by mouth daily.  . nebivolol (BYSTOLIC) 5 MG tablet Take 1 tablet (5 mg total) by mouth daily.  [name] Kitchen  olmesartan (BENICAR) 40 MG tablet Take 1 tablet (40 mg total) by mouth daily.  [name] Kitchen omeprazole (PRILOSEC) 20 MG capsule Take 1 capsule (20 mg total) by mouth daily.  . Potassium (POTASSIMIN) 75 MG TABS Take 1 tablet by mouth daily. 1 tab po qd  . pravastatin (PRAVACHOL) 20 MG tablet TAKE 1/2 TABLET BY MOUTH DAILY  . pyridostigmine (MESTINON) 60 MG tablet TAKE 1/2 TABLET BY MOUTH TWICE DAILY  . Respiratory Therapy Supplies (FLUTTER) DEVI Use as directed.  . selenium 50 MCG TABS Take 50 mcg by mouth daily.    . sodium chloride (OCEAN) 0.65 % SOLN nasal spray Place 1 spray into both nostrils as needed for congestion.  [name] Kitchen warfarin (COUMADIN) 2.5 MG tablet 1 tablet everyday except 1/2 tablet on Mondays and Fridays or as directed by coumadin clinic (Patient taking differently: 1 tablet everyday except 1/2 tablet on Mondays, Wednesdays, and Fridays or as directed by coumadin clinic)  . Zinc 22.5 MG TABS Take by mouth daily.  . [DISCONTINUED] SYMBICORT 80-4.5 MCG/ACT inhaler INHALE 2 PUFFS INTO THE LUNGS TWICE DAILY  . [EXPIRED] methylPREDNISolone acetate (DEPO-MEDROL) injection 80 mg

## 2014-05-18 DIAGNOSIS — R5383 Other fatigue: Secondary | ICD-10-CM | POA: Diagnosis not present

## 2014-05-18 NOTE — Assessment & Plan Note (Signed)
Bronchiectasis with acute exacerbation that does not appear to be infectious in nature Plan Depo-Medrol injection 80 mg was given No change in other medications

## 2014-05-24 ENCOUNTER — Ambulatory Visit (INDEPENDENT_AMBULATORY_CARE_PROVIDER_SITE_OTHER): Payer: Medicare Other | Admitting: Pharmacist

## 2014-05-24 DIAGNOSIS — Z5181 Encounter for therapeutic drug level monitoring: Secondary | ICD-10-CM

## 2014-05-24 DIAGNOSIS — I4891 Unspecified atrial fibrillation: Secondary | ICD-10-CM | POA: Diagnosis not present

## 2014-05-24 LAB — POCT INR: INR: 2.5

## 2014-06-09 ENCOUNTER — Other Ambulatory Visit: Payer: Self-pay

## 2014-06-09 DIAGNOSIS — Z1231 Encounter for screening mammogram for malignant neoplasm of breast: Secondary | ICD-10-CM

## 2014-06-17 ENCOUNTER — Ambulatory Visit (INDEPENDENT_AMBULATORY_CARE_PROVIDER_SITE_OTHER): Payer: Medicare Other | Admitting: *Deleted

## 2014-06-17 DIAGNOSIS — I4891 Unspecified atrial fibrillation: Secondary | ICD-10-CM | POA: Diagnosis not present

## 2014-06-17 DIAGNOSIS — Z5181 Encounter for therapeutic drug level monitoring: Secondary | ICD-10-CM | POA: Diagnosis not present

## 2014-06-17 LAB — POCT INR: INR: 2.6

## 2014-06-22 ENCOUNTER — Encounter: Payer: Self-pay | Admitting: Internal Medicine

## 2014-06-22 ENCOUNTER — Ambulatory Visit (INDEPENDENT_AMBULATORY_CARE_PROVIDER_SITE_OTHER): Payer: Medicare Other | Admitting: Internal Medicine

## 2014-06-22 VITALS — BP 148/70 | HR 85 | Ht 65.5 in | Wt 200.0 lb

## 2014-06-22 DIAGNOSIS — J479 Bronchiectasis, uncomplicated: Secondary | ICD-10-CM

## 2014-06-22 DIAGNOSIS — I2510 Atherosclerotic heart disease of native coronary artery without angina pectoris: Secondary | ICD-10-CM | POA: Diagnosis not present

## 2014-06-22 DIAGNOSIS — J310 Chronic rhinitis: Secondary | ICD-10-CM | POA: Diagnosis not present

## 2014-06-22 DIAGNOSIS — J471 Bronchiectasis with (acute) exacerbation: Secondary | ICD-10-CM

## 2014-06-22 MED ORDER — METHYLPREDNISOLONE ACETATE 80 MG/ML IJ SUSP: 80.0000 mg | Freq: Once | INTRAMUSCULAR | Status: DC

## 2014-06-22 MED ORDER — CEFDINIR 300 MG PO CAPS: 300.0000 mg | ORAL_CAPSULE | Freq: Two times a day (BID) | ORAL | Status: DC

## 2014-06-22 MED ORDER — FLUTTER DEVI: Status: DC

## 2014-06-22 NOTE — Progress Notes (Signed)
Subjective:    Patient ID: Stephanie Cortez, female    DOB: May 07, 1934, 80 y.o.   MRN: [id number]  HPI   80 yo WF with known hx of Bronchietasis  05/17/2014 Chief Complaint  Patient presents with  . Acute Visit    MR pt - increased SOB, increased cough, and wheezing - worse qhs x 3 months.  Cough is prod with clear mucus.  No chest tightness/pain or f/c/s.  MR pt. Acute work in   ILD /bronchiectasis Pt notes more wheezing, worse at night, cough is more, min prod, severe spells. Cough day and night. Notes more dyspnea. Major stress issues. No fever. Notes  pndrip.  Flutter valve helps  06/22/14- Acute OV- 80 yo WF with known hx of Bronchietasis MR pt c/o wheezing and runny nose (sl. yellow tint), cough off and on. Patient denies chest tightness.fever. Flutter valve has been big help and she asks replacement. She had overheated hers trying to clean it and it melted. Now increased nasal congestion 2 weeks with increased wheeze. Feels tired but no fever.  Review of Systems  Constitutional: Negative for fever and unexpected weight change.  HENT: Negative for congestion, dental problem, ear pain, nosebleeds, postnasal drip, rhinorrhea, sinus pressure, sneezing, sore throat and trouble swallowing.   Eyes: Negative for redness and itching.  Respiratory: Positive for cough and shortness of breath. Negative for chest tightness and wheezing.   Cardiovascular: Negative for palpitations and leg swelling.  Gastrointestinal: Negative for nausea and vomiting.  Genitourinary: Negative for dysuria.  Musculoskeletal: Negative for joint swelling.  Skin: Negative for rash.  Neurological: Negative for headaches.  Hematological: Does not bruise/bleed easily.  Psychiatric/Behavioral: Negative for dysphoric mood. The patient is not nervous/anxious.        Objective:   Physical Exam  Filed Vitals:   06/22/14 1113  BP: 148/70  Pulse: 85  Height: 5' 5.5" (1.664 m)  Weight: 200 lb (90.719 kg)    SpO2: 98%    Gen: Pleasant, well-nourished, in no distress,  normal affect  ENT: No lesions,  mouth clear,  oropharynx clear, no postnasal drip  Neck: No JVD, no TMG, no carotid bruits  Lungs: No use of accessory muscles, no dullness to percussion, no wheeze, few crackles  Cardiovascular: RRR, heart sounds normal, no murmur or gallops, no peripheral edema  Abdomen: soft and NT, no HSM,  BS normal  Musculoskeletal: No deformities, no cyanosis or clubbing  Neuro: alert, non focal  Skin: Warm, no lesions or rashes  No results found.      Assessment & Plan:   No problem-specific assessment & plan notes found for this encounter.   Updated Medication List Outpatient Encounter Prescriptions as of 06/22/2014  Medication Sig  . budesonide-formoterol (SYMBICORT) 80-4.5 MCG/ACT inhaler INHALE 2 PUFFS INTO THE LUNGS TWICE DAILY  . cetirizine (ZYRTEC) 10 MG tablet Take 10 mg by mouth daily as needed.   . Cholecalciferol (VITAMIN D PO) Take 1 tablet by mouth daily.  . Cyanocobalamin (VITAMIN B-12 PO) Take 1 tablet by mouth daily.  [name] Kitchen FLUoxetine (PROZAC) 10 MG capsule TAKE ONE CAPSULE BY MOUTH EVERY DAY  . furosemide (LASIX) 20 MG tablet Take 1 tablet (20 mg total) by mouth daily as needed.  [name] Kitchen levothyroxine (SYNTHROID, LEVOTHROID) 50 MCG tablet Take 1 tablet (50 mcg total) by mouth daily.  [name] Kitchen LORazepam (ATIVAN) 0.5 MG tablet TAKE 1 TABLET BY MOUTH EVERY 8 HOURS AS NEEDED FOR ANXIETY  . MAGNESIUM PO Take 1 tablet by mouth  daily.   . mesalamine (LIALDA) 1.2 G EC tablet Take 1,200 mg by mouth as needed (for diverticulitis).  . Multiple Vitamins-Minerals (EMERGEN-C VITAMIN C PO) Take 1 tablet by mouth daily.  . nebivolol (BYSTOLIC) 5 MG tablet Take 1 tablet (5 mg total) by mouth daily.  [name] Kitchen olmesartan (BENICAR) 40 MG tablet Take 1 tablet (40 mg total) by mouth daily.  [name] Kitchen omeprazole (PRILOSEC) 20 MG capsule Take 1 capsule (20 mg total) by mouth daily.  . Potassium (POTASSIMIN) 75 MG TABS Take  1 tablet by mouth daily. 1 tab po qd  . pravastatin (PRAVACHOL) 20 MG tablet TAKE 1/2 TABLET BY MOUTH DAILY  . pyridostigmine (MESTINON) 60 MG tablet TAKE 1/2 TABLET BY MOUTH TWICE DAILY  . Respiratory Therapy Supplies (FLUTTER) DEVI Use as directed.  . selenium 50 MCG TABS Take 50 mcg by mouth daily.  . sodium chloride (OCEAN) 0.65 % SOLN nasal spray Place 1 spray into both nostrils as needed for congestion.  [name] Kitchen warfarin (COUMADIN) 2.5 MG tablet 1 tablet everyday except 1/2 tablet on Mondays and Fridays or as directed by coumadin clinic (Patient taking differently: 1 tablet everyday except 1/2 tablet on Mondays, Wednesdays, and Fridays or as directed by coumadin clinic)  . Zinc 22.5 MG TABS Take by mouth daily.

## 2014-06-22 NOTE — Assessment & Plan Note (Signed)
She thinks she is starting to have an exacerbation and wanted attention before the holiday with office closures. Plan-replacement flutter valve, Cefdinir, Depo-Medrol

## 2014-06-22 NOTE — Patient Instructions (Addendum)
Script printed for Flutter Valve to use as you have been finding helpful  Script sent for cefdinir antibiotic  Depo 80  Please call as needed, and follow up with Dr Chase Caller as planned

## 2014-06-22 NOTE — Assessment & Plan Note (Signed)
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 Increasing pressure and congestion now, not yet with headache or discolored discharge. Plan-discussed saline rinse

## 2014-06-23 ENCOUNTER — Ambulatory Visit: Payer: Medicare Other

## 2014-06-24 ENCOUNTER — Other Ambulatory Visit: Payer: Self-pay | Admitting: Internal Medicine

## 2014-07-09 ENCOUNTER — Ambulatory Visit
Admission: RE | Admit: 2014-07-09 | Discharge: 2014-07-09 | Disposition: A | Discharge status: Home / Self Care | Payer: Medicare Other | Source: Ambulatory Visit

## 2014-07-09 DIAGNOSIS — Z1231 Encounter for screening mammogram for malignant neoplasm of breast: Secondary | ICD-10-CM

## 2014-07-12 ENCOUNTER — Encounter: Payer: Self-pay | Admitting: Internal Medicine

## 2014-07-12 ENCOUNTER — Ambulatory Visit (INDEPENDENT_AMBULATORY_CARE_PROVIDER_SITE_OTHER): Payer: Medicare Other | Admitting: Internal Medicine

## 2014-07-12 VITALS — BP 160/84 | HR 88 | Ht 65.0 in | Wt 191.0 lb

## 2014-07-12 DIAGNOSIS — R0982 Postnasal drip: Secondary | ICD-10-CM

## 2014-07-12 DIAGNOSIS — J849 Interstitial pulmonary disease, unspecified: Secondary | ICD-10-CM

## 2014-07-12 DIAGNOSIS — J479 Bronchiectasis, uncomplicated: Secondary | ICD-10-CM | POA: Diagnosis not present

## 2014-07-12 NOTE — Progress Notes (Signed)
Subjective:    Patient ID: Stephanie Cortez, female    DOB: [birth year], 81 y.o.   MRN: [id number]  HPI    OV 07/12/2014 Chief Complaint  Patient presents with  . Follow-up    Pt stated she is using the flutter valve and is helping bring mucus up and breath deeper. Pt stated her breathing has improved since last OV. Pt c/o mild SOB, rhinorrhea, prod cough with clear mucus.     Follow-up mild bronchiectasis initially thought was isolated but CT scan of the chest in May 2015 shows this to be a upper lobe pattern with mild NSIP type of interstitial lung disease   Presents for routine follow-up. Since I last saw her several months ago she's had some visits here for sinus drainage and respiratory exacerbation. She is now on flutter valve along with a Symbicort given to her by one of my colleagues. She is extremely pleased with her flutter valve. Overall respirator status is stable but she is worried about sinus drainage that is moderate to severe in severity and that is constant throughout the day and is new in the last several months. CT scan of the sinuses in 2013 was normal except for mucosal thickening. She has ENT specialist Dr. [name] who she has not seen in a while. Otherwise no acute issues   Past, Family, Social reviewed: no change since last visit   Review of Systems  Constitutional: Negative for fever and unexpected weight change.  HENT: Positive for rhinorrhea. Negative for congestion, dental problem, ear pain, nosebleeds, postnasal drip, sinus pressure, sneezing, sore throat and trouble swallowing.   Eyes: Negative for redness and itching.  Respiratory: Positive for cough and shortness of breath. Negative for chest tightness and wheezing.   Cardiovascular: Negative for palpitations and leg swelling.  Gastrointestinal: Negative for nausea and vomiting.  Genitourinary: Negative for dysuria.  Musculoskeletal: Negative for joint swelling.  Skin: Negative for rash.  Neurological:  Negative for headaches.  Hematological: Does not bruise/bleed easily.  Psychiatric/Behavioral: Negative for dysphoric mood. The patient is not nervous/anxious.        Objective:   Physical Exam  Filed Vitals:   07/12/14 1552  BP: 160/84  Pulse: 88  Height: 5\' 5"  (1.651 m)  Weight: 191 lb (86.637 kg)  SpO2: 99%      HEENT: nl dentition, turbinates, and orophanx. Nl external ear canals without cough reflex   NECK :  without JVD/Nodes/TM/ nl carotid upstrokes bilaterally   LUNGS: no acc muscle use, clear to A and P bilaterally without cough on insp or exp maneuvers. No wheeze  CV:  RRR  no s3 or murmur or increase in P2, no edema   ABD:  soft and nontender with nl excursion in the supine position. No bruits or organomegaly, bowel sounds nl  MS:  warm without deformities, calf tenderness, cyanosis or clubbing  SKIN: warm and dry without lesions    NEURO:  alert, approp, no deficits        Assessment & Plan:     ICD-9-CM ICD-10-CM   1. Post-nasal drip 784.91 R09.82   2. Bronchiectasis without complication 323.5 T73.2 CT Chest High Resolution  3. ILD (interstitial lung disease) 515 J84.9 Pulmonary Function Test     CT Chest High Resolution   #Sinus drainage -no need for another sinus CT -you had one in 2013   take generic fluticasone inhaler 2 squirts each nostril daily - follow with Dr [name] for sinus drip issues  #Bronchietassis  with ILD pattern - stable currently  - High Resolution CT chest without contrast on ILD protocol in June 2016  - Full PFT in June 2016 - continue flutter valve and symbicort  #Followup  June 2016 after above     Dr. Brand Males, M.D., [facility].C.P Pulmonary and Critical Care Medicine Staff Physician [facility] Pager: [phone], If no answer or between  15:00h - 7:00h: call [phone]  07/12/2014 4:24 PM

## 2014-07-12 NOTE — Patient Instructions (Addendum)
ICD-9-CM ICD-10-CM   1. Post-nasal drip 784.91 R09.82   2. Bronchiectasis without complication 568.1 E75.1    #Sinus drainage -no need for another sinus CT -you had one in 2013   take generic fluticasone inhaler 2 squirts each nostril daily - follow with Dr [name] for sinus drip issues  #Bronchietassis with ILD pattern - stable currently  - High Resolution CT chest without contrast on ILD protocol in June 2016  - Full PFT in June 2016 - continue flutter valve and symbicort  #Followup  June 2016 after above

## 2014-07-15 ENCOUNTER — Ambulatory Visit (INDEPENDENT_AMBULATORY_CARE_PROVIDER_SITE_OTHER): Payer: Medicare Other | Admitting: *Deleted

## 2014-07-15 DIAGNOSIS — Z5181 Encounter for therapeutic drug level monitoring: Secondary | ICD-10-CM | POA: Diagnosis not present

## 2014-07-15 DIAGNOSIS — I4891 Unspecified atrial fibrillation: Secondary | ICD-10-CM

## 2014-07-15 LAB — POCT INR: INR: 2.6

## 2014-07-28 DIAGNOSIS — E039 Hypothyroidism, unspecified: Secondary | ICD-10-CM | POA: Diagnosis not present

## 2014-07-28 DIAGNOSIS — E785 Hyperlipidemia, unspecified: Secondary | ICD-10-CM | POA: Diagnosis not present

## 2014-07-28 DIAGNOSIS — Z1389 Encounter for screening for other disorder: Secondary | ICD-10-CM | POA: Diagnosis not present

## 2014-07-28 DIAGNOSIS — Z0000 Encounter for general adult medical examination without abnormal findings: Secondary | ICD-10-CM | POA: Diagnosis not present

## 2014-07-28 DIAGNOSIS — I10 Essential (primary) hypertension: Secondary | ICD-10-CM | POA: Diagnosis not present

## 2014-07-28 DIAGNOSIS — Z23 Encounter for immunization: Secondary | ICD-10-CM | POA: Diagnosis not present

## 2014-07-28 DIAGNOSIS — F419 Anxiety disorder, unspecified: Secondary | ICD-10-CM | POA: Diagnosis not present

## 2014-08-02 DIAGNOSIS — J310 Chronic rhinitis: Secondary | ICD-10-CM | POA: Diagnosis not present

## 2014-08-20 ENCOUNTER — Encounter: Payer: Self-pay | Admitting: Internal Medicine

## 2014-08-20 ENCOUNTER — Ambulatory Visit (INDEPENDENT_AMBULATORY_CARE_PROVIDER_SITE_OTHER): Payer: Medicare Other | Admitting: Internal Medicine

## 2014-08-20 VITALS — BP 132/74 | HR 78 | Ht 65.5 in | Wt 201.0 lb

## 2014-08-20 DIAGNOSIS — R109 Unspecified abdominal pain: Secondary | ICD-10-CM | POA: Diagnosis not present

## 2014-08-20 DIAGNOSIS — K589 Irritable bowel syndrome without diarrhea: Secondary | ICD-10-CM

## 2014-08-20 DIAGNOSIS — K5010 Crohn's disease of large intestine without complications: Secondary | ICD-10-CM | POA: Diagnosis not present

## 2014-08-20 DIAGNOSIS — R195 Other fecal abnormalities: Secondary | ICD-10-CM

## 2014-08-20 DIAGNOSIS — K220 Achalasia of cardia: Secondary | ICD-10-CM

## 2014-08-20 MED ORDER — MESALAMINE 1.2 G PO TBEC: 2.4000 g | DELAYED_RELEASE_TABLET | Freq: Every day | ORAL | Status: DC

## 2014-08-20 MED ORDER — HYOSCYAMINE SULFATE 0.125 MG SL SUBL: SUBLINGUAL_TABLET | SUBLINGUAL | Status: DC

## 2014-08-20 MED ORDER — MESALAMINE 1.2 G PO TBEC: 2.4000 g | DELAYED_RELEASE_TABLET | ORAL | Status: DC | PRN

## 2014-08-20 NOTE — Progress Notes (Signed)
Subjective:    Patient ID: Stephanie Cortez, female    DOB: [birth year], 81 y.o.   MRN: [id number]  HPI [name] is a 81 year old female with past medical history of segmental colitis of the left colon, diverticulosis, IBS, cricopharyngeal achalasia treated with Botox and managed by ENT, atrial fibrillation on warfarin, bronchiectasis, and hypertension who is seen for follow-up. She is here alone today. I last saw her on 12/14/2013. At that time I recommended Lialda at 2.4 g per day for segment colitis but she has been only using this as needed. She never really used it at that dose on a consistent basis. She continues to have lower abdominal discomfort and loose stools. She denies frank diarrhea as stools are not watery. They occur 2-3 times daily but she does report feeling like it is "difficult to finish" or "I just can't get out of the bathroom sometimes". Can be worse after eating. There are times when she gets a more severe left lower quadrant cramping type pain. This can bend her over and require her to lie down. When it is most severe she can feel slightly lightheaded and slightly diaphoretic. No syncope. Her nausea or vomiting with this pain. This is intermittent and not consistent. No fevers or chills. She reports a good appetite though she has never been a breakfast eater. She does report a lot of stress and this makes her loose stools and lower abdominal discomfort worse.  Colonoscopy performed on 11/14/2011 showed left-sided diverticulosis without polyps. Endoscopy was attempted on the same day but could not be performed due to trouble intubating the esophagus in the setting of cricopharyngeal achalasia   Review of Systems As per history of present illness, otherwise negative  Current Medications, Allergies, Past Medical History, Past Surgical History, Family History and Social History were reviewed in [employer] record.     Objective:   Physical  Exam BP 132/74 mmHg  Pulse 78  Ht 5' 5.5" (1.664 m)  Wt 201 lb (91.173 kg)  BMI 32.93 kg/m2 Constitutional: Well-developed and well-nourished. No distress. HEENT: Normocephalic and atraumatic. Oropharynx is clear and moist. No oropharyngeal exudate. Conjunctivae are normal.  No scleral icterus. Neck:  Trachea midline. Cardiovascular: Normal rate, regular rhythm and intact distal pulses. No M/R/G Pulmonary/chest: Effort normal and breath sounds normal. No wheezing, rales or rhonchi. Abdominal: Soft, obese, there is mild tenderness in the left abdomen but also bilateral lower abdomen without rebound or guarding and no focal severe tenderness, nondistended. Bowel sounds active throughout.  Extremities: no clubbing, cyanosis, or edema Lymphadenopathy: No cervical adenopathy noted. Neurological: Alert and oriented to person place and time. Skin: Skin is warm and dry. No rashes noted. Psychiatric: Normal mood and affect. Behavior is normal.  Colonoscopy report from Dr. Sharlett Iles reviewed today  Last abdominal CT 2013 reviewed, scattered diverticulosis without acute abnormality     Assessment & Plan:  81 year old female with past medical history of segmental colitis of the left colon, diverticulosis, IBS, cricopharyngeal achalasia treated with Botox and managed by ENT, atrial fibrillation on warfarin, bronchiectasis, and hypertension who is seen for follow-up.   1. IBS/segmental colitis/Left-sided abdominal pain -- her symptoms could be consistent with both the segment all type colitis but also irritable bowel with cramping and loose stools. We discussed this at length today. I do think that she would benefit from taking Lialda 2.4 g daily on a scheduled regular basis. She understands this recommendation and samples were given today. I also would  like her to begin Benefiber 1 tablespoon daily to try to help bulk stools and aid with evacuation. Finally when her cramping is most severe she is having  some mild vagal symptoms and I will give her Levsin 0.125 mg to be used sublingual 1-2 tablets every 4-6 hours for cramping type lower abdominal pain. I have asked that she start with 1 tablet only but can increase to 2 if necessary. Pain today is not consistent with diverticulitis. I would like to see her back in 6-8 weeks to assess her response and ensure there is some improvement.  2. Cricopharyngeal achalasia -- getting intermittent Botox with Dr. [name], prior excellent response

## 2014-08-20 NOTE — Patient Instructions (Signed)
We have sent the following medications to your pharmacy for you to pick up at your convenience: Levsin SL- Dissolve 1-2 tablets under the tongue (start out with 1 tablet) every 4-6 hours as needed Lialda 2.4 grams daily  We have given you samples of Lialda.  Please follow up with Dr Hilarie Fredrickson in 8 weeks.  Call our office if not better in the next few weeks. Our phone number is [phone].  CC:Dr Candace Tamala Julian

## 2014-08-26 ENCOUNTER — Ambulatory Visit (INDEPENDENT_AMBULATORY_CARE_PROVIDER_SITE_OTHER): Payer: Medicare Other | Admitting: *Deleted

## 2014-08-26 DIAGNOSIS — I4891 Unspecified atrial fibrillation: Secondary | ICD-10-CM | POA: Diagnosis not present

## 2014-08-26 DIAGNOSIS — Z5181 Encounter for therapeutic drug level monitoring: Secondary | ICD-10-CM

## 2014-08-26 LAB — POCT INR: INR: 2.8

## 2014-08-31 ENCOUNTER — Telehealth: Payer: Self-pay | Admitting: Internal Medicine

## 2014-08-31 MED ORDER — PREDNISONE 10 MG PO TABS: ORAL_TABLET | ORAL | Status: DC

## 2014-08-31 MED ORDER — DOXYCYCLINE HYCLATE 100 MG PO TABS: 100.0000 mg | ORAL_TABLET | Freq: Two times a day (BID) | ORAL | Status: DC

## 2014-08-31 NOTE — Addendum Note (Signed)
Addended by: Len Blalock on: 08/31/2014 09:40 AM   Modules accepted: Orders

## 2014-08-31 NOTE — Telephone Encounter (Addendum)
--  Message closed in error-- Pt c/o PND, chest congestion, cough with light tan mucus and deep/raspy wheeze. Pt denies fever. States that all this started with a sinus infection she believes.  Requesting prednisone sent to pharmacy with abx if needed.   Allergies  Allergen Reactions  . Biaxin [Clarithromycin] Nausea And Vomiting  . Flagyl [Metronidazole] Nausea And Vomiting  . Moxifloxacin Other (See Comments)    REACTION: weakness  . Pantoprazole Sodium Other (See Comments)    REACTION: rash  . Penicillins Other (See Comments)    REACTION: rash   Please advise Dr Chase Caller. Thanks.

## 2014-08-31 NOTE — Telephone Encounter (Signed)
Please take prednisone 40 mg x1 day, then 30 mg x1 day, then 20 mg x1 day, then 10 mg x1 day, and then 5 mg x1 day and stop  Also  I think she best do some antibiotic  Take doxycycline 100mg  po twice daily x 5 days; take after meals and avoid sunlight

## 2014-08-31 NOTE — Telephone Encounter (Signed)
Spoke with pt, she is aware of recs.  meds sent.  Nothing further needed.

## 2014-09-02 ENCOUNTER — Telehealth: Payer: Self-pay | Admitting: Internal Medicine

## 2014-09-02 NOTE — Telephone Encounter (Signed)
Per Dr Chase Caller:  As long as Doxy is helping her try to finish out at least 1 more day.  And complete Pred taper.  Call us if no better. Spoke with pt and advised of Dr Golden Pop recommendations.  PT verbalized understanding.

## 2014-09-02 NOTE — Telephone Encounter (Signed)
Pt states since taking Doxycycline she is having red spots on lower face and neck and a lot of skin itching.  She has had a similar reaction to Doxy in the past.  She has 3 days left and would like to try to finish it out if MR thinks this is safe.  She took Benadryl last night that did help her sleep but did not help with red spots.  She also has 2 days left or Pred taper.  Please advise.

## 2014-09-03 ENCOUNTER — Telehealth: Payer: Self-pay | Admitting: Internal Medicine

## 2014-09-03 ENCOUNTER — Ambulatory Visit (INDEPENDENT_AMBULATORY_CARE_PROVIDER_SITE_OTHER): Payer: Medicare Other | Admitting: *Deleted

## 2014-09-03 DIAGNOSIS — Z5181 Encounter for therapeutic drug level monitoring: Secondary | ICD-10-CM

## 2014-09-03 DIAGNOSIS — I4891 Unspecified atrial fibrillation: Secondary | ICD-10-CM | POA: Diagnosis not present

## 2014-09-03 LAB — POCT INR: INR: 4.1

## 2014-09-03 NOTE — Telephone Encounter (Signed)
Spoke with pt. Pt requesting someone listen to her lungs to see if she sounds ok because she has been on a pred taper and had to stop doxy early d/t reaction. Auscultated pt's lung sounds and all fields were clear. Pt verbalized understanding. Pt stated she is feeling better since taking the medications but wanted someone to listen to her. Advised pt that if she worsens over the weekend to seek emergency care or call on-call doctor and then to also call on Monday if needed. Pt verbalized understanding and denied any further questions or concerns at this time.   Will forward to MR as FYI.

## 2014-09-04 NOTE — Telephone Encounter (Signed)
Triage  Please followup with patient week of 09/06/14

## 2014-09-06 NOTE — Telephone Encounter (Signed)
Scheduled pt for tomorrow at 4:15 with BQ.  Pt aware.  Nothing further needed.

## 2014-09-06 NOTE — Telephone Encounter (Signed)
Pt with too many allergies to throw more abx at at this point and if prednisone doesn't help needs ov - if the sinus/ ears are a problem needs to be seen by ent

## 2014-09-06 NOTE — Telephone Encounter (Signed)
Spoke with pt, states her L ear feels full, c/o intermittent audible "rattling" wheezing.  Pt states she feels better and is doing better but states "something is just not right", still has some mucus in her chest.  Pt using saline in sinus which helps, and honey and lemon tea helps to suppress her coughing.   Pt had to d/c doxy after 3 days due to developing red splotches on skin.  Has also finished prednisone. Pt is requesting further recs.  Sending to MW as MR is on vacation today.  Thanks!

## 2014-09-07 ENCOUNTER — Encounter: Payer: Self-pay | Admitting: Pulmonary Disease

## 2014-09-07 ENCOUNTER — Ambulatory Visit (INDEPENDENT_AMBULATORY_CARE_PROVIDER_SITE_OTHER): Payer: Medicare Other | Admitting: Pulmonary Disease

## 2014-09-07 ENCOUNTER — Ambulatory Visit (INDEPENDENT_AMBULATORY_CARE_PROVIDER_SITE_OTHER)
Admission: RE | Admit: 2014-09-07 | Discharge: 2014-09-07 | Disposition: A | Discharge status: Home / Self Care | Payer: Medicare Other | Source: Ambulatory Visit | Attending: Pulmonary Disease | Admitting: Pulmonary Disease

## 2014-09-07 VITALS — BP 144/78 | HR 81 | Temp 98.3°F | Ht 65.0 in | Wt 201.0 lb

## 2014-09-07 DIAGNOSIS — R05 Cough: Secondary | ICD-10-CM

## 2014-09-07 DIAGNOSIS — R059 Cough, unspecified: Secondary | ICD-10-CM

## 2014-09-07 DIAGNOSIS — J471 Bronchiectasis with (acute) exacerbation: Secondary | ICD-10-CM | POA: Diagnosis not present

## 2014-09-07 DIAGNOSIS — R058 Other specified cough: Secondary | ICD-10-CM

## 2014-09-07 DIAGNOSIS — J449 Chronic obstructive pulmonary disease, unspecified: Secondary | ICD-10-CM | POA: Diagnosis not present

## 2014-09-07 MED ORDER — ALBUTEROL SULFATE (2.5 MG/3ML) 0.083% IN NEBU: 2.5000 mg | INHALATION_SOLUTION | Freq: Four times a day (QID) | RESPIRATORY_TRACT | Status: DC | PRN

## 2014-09-07 NOTE — Progress Notes (Signed)
Subjective:    Patient ID: Stephanie Cortez, female    DOB: 1934/03/10, 81 y.o.   MRN: [id number]  HPI Chief Complaint  Patient presents with  . Acute Visit    MR pt c/o rattling, wheezing in chest, prod cough with light yellow mucus X1-2 weeks.    [name] says that she has had a rattling in her chest for several weeks now.  She has been experiencing a cough, chest congestion and increased wheezing.  She took prednisone and doxycycline for 5 days. She developed a rash on her face so she called our office and she was told that she needed to stop taking the doxycycline.  She only took 5 days of both doxycycline and prednisone.  She says that this particular episode of the chest congestion and the mucus production has contributed to the prolonged symptoms. She says that she has a lot of mucus in her chest.  She has been experiencing more mucus in her upper throat.  She continues to use the flutter valve but she still feels a lot of mucus in her chest.  She denies fevers or chills.  Her symptoms this time around have lasted for too long.  She has not lost weight and in fact she has gained weight on the prednisone.  She is coughing up clear mucus primarily. She has been experiencing more sinus congestion recently.  She notes eating most of her calories in the middle of the night and then lying flat afterwards.   Past Medical History  Diagnosis Date  . Bronchiectasis     >PFT 07/13/2008 in [place] 1.9L/76%, FVC 2.45L/74, Ratio 79, TLC 121%, DLCO 64%  AE BRonchiectasis - Dec 2010.New Rx:  outpatient - Feb 2011 - Rx outpatient  . Dyslipidemia   . Eczema   . Anemia     - Hgb 9.7gm% on 07/13/2008 in [place] -  Hgg 129gm% wiht normal irone levsl and ferritin 10/27/2008 in [place] Recurrent otitis/sinusitis  . Atrial fibrillation     chronic anticoag  . Glaucoma   . Back pain 2009  . Anxiety     chronic BZ prn  . Diverticulosis   . COPD (chronic obstructive pulmonary disease)     bronchiectasis  .  Hypertension   . Arthritis   . IBS (irritable bowel syndrome)   . H. pylori infection   . GERD with stricture   . Hyponatremia     chronic, s/p endo eval 06/2012  . Hypothyroid   . DDD (degenerative disc disease), lumbar     MRI 08/2012 - working with [facility]  . CHF (congestive heart failure)       Review of Systems  Constitutional: Positive for fatigue. Negative for fever and chills.  HENT: Positive for postnasal drip, rhinorrhea and sinus pressure.   Respiratory: Positive for cough, shortness of breath and wheezing.   Cardiovascular: Negative for chest pain, palpitations and leg swelling.       Objective:   Physical Exam Filed Vitals:   09/07/14 1628  BP: 144/78  Pulse: 81  Temp: 98.3 F (36.8 C)  TempSrc: Oral  Height: 5\' 5"  (1.651 m)  Weight: 201 lb (91.173 kg)  SpO2: 97%   RA  Gen: well appearing, no acute distress HEENT: NCAT, PERRL, EOMi, OP clear, neck supple without masses PULM: very mild wheezing in the left lower lobe CV: RRR, no mgr, no JVD AB: BS+, soft, nontender, no hsm Ext: warm, no edema, no clubbing, no cyanosis Derm: no  rash or skin breakdown Neuro: A&Ox4, CN II-XII intact, strength 5/5 in all 4 extremities  2015 CT chest reviewed> very mild bronchiectasis (one focus in the RML), scattered non-specific mild interstitial changes bilaterally     Assessment & Plan:   Upper airway cough syndrome Stephanie Cortez comes in complaining of mucus production from her throat but no shortness of breath. It sounds like she has had a lot of increased sinus symptoms in the last several weeks. She has been very diligent with her sinus toilet regimen. On physical exam she has a slight amount of wheezing in the lower lobes but the remainder of her exam is normal.  I spent a long time talking to her trying to figure out where the symptoms are coming from. She does not describe cough productive of purulent sputum as you would expect for someone with bronchiectasis. I then  took the time to examine her CT chest and I feel that the finding of bronchiectasis is quite mild at best and I doubt that that explains her symptoms.  I think that the most likely explanation for her symptoms is postnasal drip causing upper airway cough syndrome. I think this is exacerbated by acid reflux which she makes worse by eating most of her meals at night and then lying flat.  Plan: -Check a chest x-ray to ensure there is nothing else going on -She was given detailed instructions on acid reflux lifestyle modification changes -Increase Prilosec to twice a day -Use albuterol nebulizer as needed for the mild wheezing -Continue Symbicort -Would hold any further antibiotics or prednisone as I do not believe she has a lower respiratory tract infection. -Follow-up with her primary pulmonologist as previously scheduled.  Greater than 25 minutes were spent in consultation with the patient today.     Updated Medication List Outpatient Encounter Prescriptions as of 09/07/2014  Medication Sig  . budesonide-formoterol (SYMBICORT) 80-4.5 MCG/ACT inhaler INHALE 2 PUFFS INTO THE LUNGS TWICE DAILY  . cetirizine (ZYRTEC) 10 MG tablet Take 10 mg by mouth daily as needed.   . Cholecalciferol (VITAMIN D PO) Take 1 tablet by mouth daily.  . Cyanocobalamin (VITAMIN B-12 PO) Take 1 tablet by mouth daily.  [name] Kitchen FLUoxetine (PROZAC) 10 MG capsule TAKE ONE CAPSULE BY MOUTH EVERY DAY  . furosemide (LASIX) 20 MG tablet Take 1 tablet (20 mg total) by mouth daily as needed.  . hyoscyamine (LEVSIN SL) 0.125 MG SL tablet Dissolve 1-2 tablets under the tongue every 4-6 hours as needed for left lower quadrant abdominal pain/cramping  . levothyroxine (SYNTHROID, LEVOTHROID) 50 MCG tablet Take 1 tablet (50 mcg total) by mouth daily.  [name] Kitchen LORazepam (ATIVAN) 0.5 MG tablet TAKE 1 TABLET BY MOUTH EVERY 8 HOURS AS NEEDED FOR ANXIETY  . MAGNESIUM PO Take 1 tablet by mouth daily.   . mesalamine (LIALDA) 1.2 G EC tablet Take 2  tablets (2.4 g total) by mouth daily.  . Multiple Vitamins-Minerals (EMERGEN-C VITAMIN C PO) Take 1 tablet by mouth daily.  . nebivolol (BYSTOLIC) 5 MG tablet Take 1 tablet (5 mg total) by mouth daily.  [name] Kitchen olmesartan (BENICAR) 40 MG tablet Take 1 tablet (40 mg total) by mouth daily.  [name] Kitchen omeprazole (PRILOSEC) 20 MG capsule Take 1 capsule (20 mg total) by mouth daily.  . Potassium (POTASSIMIN) 75 MG TABS Take 1 tablet by mouth daily. 1 tab po qd  . pravastatin (PRAVACHOL) 20 MG tablet TAKE 1/2 TABLET BY MOUTH DAILY  . pyridostigmine (MESTINON) 60 MG tablet TAKE 1/2 TABLET  BY MOUTH TWICE DAILY  . Respiratory Therapy Supplies (FLUTTER) DEVI Use as directed  . selenium 50 MCG TABS Take 50 mcg by mouth daily.  . sodium chloride (OCEAN) 0.65 % SOLN nasal spray Place 1 spray into both nostrils as needed for congestion.  [name] Kitchen warfarin (COUMADIN) 2.5 MG tablet 1 tablet everyday except 1/2 tablet on Mondays and Fridays or as directed by coumadin clinic (Patient taking differently: 1 tablet everyday except 1/2 tablet on Mondays, Wednesdays, and Fridays or as directed by coumadin clinic)  . Zinc 22.5 MG TABS Take by mouth daily.  [name] Kitchen albuterol (PROVENTIL) (2.5 MG/3ML) 0.083% nebulizer solution Take 3 mLs (2.5 mg total) by nebulization every 6 (six) hours as needed for wheezing or shortness of breath.  . [DISCONTINUED] doxycycline (VIBRA-TABS) 100 MG tablet Take 1 tablet (100 mg total) by mouth 2 (two) times daily. (Patient not taking: Reported on 09/07/2014)  . [DISCONTINUED] predniSONE (DELTASONE) 10 MG tablet 40 mg x1day, then 30 mg x1day, then 20 mg x1day, then 10 mg x1day, and then 5 mg x1day. (Patient not taking: Reported on 09/07/2014)

## 2014-09-07 NOTE — Assessment & Plan Note (Signed)
Stephanie Cortez comes in complaining of mucus production from her throat but no shortness of breath. It sounds like she has had a lot of increased sinus symptoms in the last several weeks. She has been very diligent with her sinus toilet regimen. On physical exam she has a slight amount of wheezing in the lower lobes but the remainder of her exam is normal.  I spent a long time talking to her trying to figure out where the symptoms are coming from. She does not describe cough productive of purulent sputum as you would expect for someone with bronchiectasis. I then took the time to examine her CT chest and I feel that the finding of bronchiectasis is quite mild at best and I doubt that that explains her symptoms.  I think that the most likely explanation for her symptoms is postnasal drip causing upper airway cough syndrome. I think this is exacerbated by acid reflux which she makes worse by eating most of her meals at night and then lying flat.  Plan: -Check a chest x-ray to ensure there is nothing else going on -She was given detailed instructions on acid reflux lifestyle modification changes -Increase Prilosec to twice a day -Use albuterol nebulizer as needed for the mild wheezing -Continue Symbicort -Would hold any further antibiotics or prednisone as I do not believe she has a lower respiratory tract infection. -Follow-up with her primary pulmonologist as previously scheduled.  Greater than 25 minutes were spent in consultation with the patient today.

## 2014-09-07 NOTE — Patient Instructions (Signed)
Follow the GERD diet we provided you today Take prilosec twice a day instead of once a day  Use the albuterol nebulizer three times a day for the next 3-4 days, then use as needed  We will see you back as ordered by Dr. Chase Caller

## 2014-09-09 DIAGNOSIS — J310 Chronic rhinitis: Secondary | ICD-10-CM | POA: Diagnosis not present

## 2014-09-09 DIAGNOSIS — R490 Dysphonia: Secondary | ICD-10-CM | POA: Diagnosis not present

## 2014-09-09 DIAGNOSIS — J3089 Other allergic rhinitis: Secondary | ICD-10-CM | POA: Diagnosis not present

## 2014-09-10 NOTE — Progress Notes (Signed)
Quick Note:  Pt aware of recs. ______

## 2014-09-13 ENCOUNTER — Ambulatory Visit: Payer: Medicare Other | Admitting: Internal Medicine

## 2014-09-13 ENCOUNTER — Telehealth: Payer: Self-pay | Admitting: *Deleted

## 2014-09-13 NOTE — Telephone Encounter (Signed)
Mandy called from [place]. BQ wrote order for pt neb medications at acute visit. This is MR patient but he is working over in the hospital and BQ on vacation. She needs the CMN signed today for pt neb medication. Since VS is DOD this afternoon, she will fax this over to triage fax to see if he can sign this. She needs this back today. \ Please advise VS if okay to sign this afternoon? thanks

## 2014-09-13 NOTE — Telephone Encounter (Signed)
Form given to Ashtyn to have Dr. Halford Chessman sign.  Awaiting signature.

## 2014-09-13 NOTE — Telephone Encounter (Signed)
I can sign form.

## 2014-09-14 NOTE — Telephone Encounter (Signed)
I signed form on 09/13/14.

## 2014-09-17 ENCOUNTER — Ambulatory Visit (INDEPENDENT_AMBULATORY_CARE_PROVIDER_SITE_OTHER): Payer: Medicare Other | Admitting: *Deleted

## 2014-09-17 DIAGNOSIS — I4891 Unspecified atrial fibrillation: Secondary | ICD-10-CM

## 2014-09-17 DIAGNOSIS — Z5181 Encounter for therapeutic drug level monitoring: Secondary | ICD-10-CM | POA: Diagnosis not present

## 2014-09-17 LAB — POCT INR: INR: 2

## 2014-09-28 ENCOUNTER — Ambulatory Visit (INDEPENDENT_AMBULATORY_CARE_PROVIDER_SITE_OTHER): Payer: Medicare Other | Admitting: Internal Medicine

## 2014-09-28 ENCOUNTER — Encounter: Payer: Self-pay | Admitting: Internal Medicine

## 2014-09-28 VITALS — BP 112/78 | HR 82 | Ht 66.0 in | Wt 199.8 lb

## 2014-09-28 DIAGNOSIS — I482 Chronic atrial fibrillation, unspecified: Secondary | ICD-10-CM

## 2014-09-28 NOTE — Progress Notes (Signed)
Patient Care Team: [name], MD as PCP - General (Family Medicine) Deboraha Sprang, MD (Cardiology) Sable Feil, MD (Gastroenterology) Brand Males, MD (Pulmonary Disease) Renato Shin, MD (Endocrinology) Erline Levine, MD (Neurosurgery) Weber Cooks, MD (Orthopedic Surgery) Rozetta Nunnery, MD (Otolaryngology)   HPI  Stephanie Cortez is a 81 y.o. female Seen in followup for long-standing atrial fibrillation dating back about 10 years. It is felt to be permanent having failed multiple cardioversions and antiarrhythmic drugs.   She was admitted 12/14 [place]   because of chest pain elevated blood pressure and confirmed atrial fibrillation with a rapid rate. She underwent a catheterization which was nonobstructive apparently. An echocardiogram was also normal except for left atrial enlargement. These records were reviewed. The chest x-ray report showed some pulmonary edema. Her BNP was 1900(upper limit 1800)   Overall she is doing pretty well. Dyspnea is stable. There is no significant edema.  Her sister is much improved assisted living.  Her biggest problem is with her back and she seems advice regarding back surgeons.   Past Medical History  Diagnosis Date  . Bronchiectasis     >PFT 07/13/2008 in [place] 1.9L/76%, FVC 2.45L/74, Ratio 79, TLC 121%, DLCO 64%  AE BRonchiectasis - Dec 2010.New Rx:  outpatient - Feb 2011 - Rx outpatient  . Dyslipidemia   . Eczema   . Anemia     - Hgb 9.7gm% on 07/13/2008 in [place] -  Hgg 129gm% wiht normal irone levsl and ferritin 10/27/2008 in [place] Recurrent otitis/sinusitis  . Atrial fibrillation     chronic anticoag  . Glaucoma   . Back pain 2009  . Anxiety     chronic BZ prn  . Diverticulosis   . COPD (chronic obstructive pulmonary disease)     bronchiectasis  . Hypertension   . Arthritis   . IBS (irritable bowel syndrome)   . H. pylori infection   . GERD with stricture   . Hyponatremia     chronic, s/p endo  eval 06/2012  . Hypothyroid   . DDD (degenerative disc disease), lumbar     MRI 08/2012 - working with NSurg  . CHF (congestive heart failure)     Past Surgical History  Procedure Laterality Date  . Cataract extraction      both  . Foot surgery  03/14/2011    gastroc slide-rt  . Total abdominal hysterectomy    . Colonoscopy    . Breast surgery      br bx  . Esophagoscopy w/ botox injection  12/11/2011    Procedure: ESOPHAGOSCOPY WITH BOTOX INJECTION;  Surgeon: Rozetta Nunnery, MD;  Location: [facility];  Service: ENT;  Laterality: N/A;  esophogoscopy with dilation, botox injection  . Cardiac catheterization      Current Outpatient Prescriptions  Medication Sig Dispense Refill  . albuterol (PROVENTIL) (2.5 MG/3ML) 0.083% nebulizer solution Take 3 mLs (2.5 mg total) by nebulization every 6 (six) hours as needed for wheezing or shortness of breath. 75 mL 12  . budesonide-formoterol (SYMBICORT) 80-4.5 MCG/ACT inhaler INHALE 2 PUFFS INTO THE LUNGS TWICE DAILY 10.2 g 5  . cetirizine (ZYRTEC) 10 MG tablet Take 10 mg by mouth daily as needed.     . Cholecalciferol (VITAMIN D PO) Take 1 tablet by mouth daily.    . Cyanocobalamin (VITAMIN B-12 PO) Take 1 tablet by mouth daily.    [name] Kitchen FLUoxetine (PROZAC) 10 MG capsule TAKE ONE CAPSULE BY MOUTH EVERY  DAY 90 capsule 0  . furosemide (LASIX) 20 MG tablet Take 1 tablet (20 mg total) by mouth daily as needed. 30 tablet 1  . hyoscyamine (LEVSIN SL) 0.125 MG SL tablet Dissolve 1-2 tablets under the tongue every 4-6 hours as needed for left lower quadrant abdominal pain/cramping 60 tablet 1  . levothyroxine (SYNTHROID, LEVOTHROID) 50 MCG tablet Take 1 tablet (50 mcg total) by mouth daily. 90 tablet 3  . LORazepam (ATIVAN) 0.5 MG tablet TAKE 1 TABLET BY MOUTH EVERY 8 HOURS AS NEEDED FOR ANXIETY 90 tablet 3  . MAGNESIUM PO Take 1 tablet by mouth daily.     . mesalamine (LIALDA) 1.2 G EC tablet Take 2 tablets (2.4 g total) by mouth daily.  60 tablet 2  . Multiple Vitamins-Minerals (EMERGEN-C VITAMIN C PO) Take 1 tablet by mouth daily.    . nebivolol (BYSTOLIC) 5 MG tablet Take 1 tablet (5 mg total) by mouth daily. 30 tablet 12  . olmesartan (BENICAR) 40 MG tablet Take 1 tablet (40 mg total) by mouth daily. 90 tablet 3  . omeprazole (PRILOSEC) 20 MG capsule Take 1 capsule (20 mg total) by mouth daily. 30 capsule 11  . Potassium (POTASSIMIN) 75 MG TABS Take 1 tablet by mouth daily. 1 tab po qd    . pravastatin (PRAVACHOL) 20 MG tablet TAKE 1/2 TABLET BY MOUTH DAILY 45 tablet 3  . pyridostigmine (MESTINON) 60 MG tablet TAKE 1/2 TABLET BY MOUTH TWICE DAILY 30 tablet 4  . Respiratory Therapy Supplies (FLUTTER) DEVI Use as directed 1 each 0  . selenium 50 MCG TABS Take 50 mcg by mouth daily.    . sodium chloride (OCEAN) 0.65 % SOLN nasal spray Place 1 spray into both nostrils as needed for congestion.    [name] Kitchen warfarin (COUMADIN) 2.5 MG tablet 1 tablet everyday except 1/2 tablet on Mondays and Fridays or as directed by coumadin clinic (Patient taking differently: 1 tablet everyday except 1/2 tablet on Mondays, Wednesdays, and Fridays or as directed by coumadin clinic) 35 tablet 2  . Zinc 22.5 MG TABS Take by mouth daily.     No current facility-administered medications for this visit.    Allergies  Allergen Reactions  . Doxycycline     Rash on face and neck  . Biaxin [Clarithromycin] Nausea And Vomiting  . Flagyl [Metronidazole] Nausea And Vomiting  . Moxifloxacin Other (See Comments)    REACTION: weakness  . Pantoprazole Sodium Other (See Comments)    REACTION: rash  . Penicillins Other (See Comments)    REACTION: rash    Review of Systems negative except from HPI and PMH  Physical Exam BP 112/78 mmHg  Pulse 82  Ht 5\' 6"  (1.676 m)  Wt 199 lb 12.8 oz (90.629 kg)  BMI 32.26 kg/m2 Well developed and well nourished in no acute distress HENT normal E scleral and icterus clear Neck Supple JVP flat; carotids brisk and  full Clear to ausculation Irregularly irregular rate and rhythm, 2/6 murmurs  Soft with active bowel sounds No clubbing cyanosis none Edema Alert and oriented, grossly normal motor and sensory function Skin Warm and Dry  ECG demonstrates atrial fibrillation at 72 Intervals-/08/38 Axis LI Otherwise normal  Assessment and  Plan  Atrial fibrillation-permanent  HFpEF  Hypertension  Orthostatic hypotension   She is doing relatively well.   We will continue her on her current medications.   Blood pressure is well-controlled. There is a little orthostasis.   She remains on anticoagulation.  She is  euvolemic.

## 2014-09-28 NOTE — Patient Instructions (Signed)
Your physician recommends that you continue on your current medications as directed. Please refer to the Current Medication list given to you today.  Your physician wants you to follow-up in: 1 year with Dr. Klein.  You will receive a reminder letter in the mail two months in advance. If you don't receive a letter, please call our office to schedule the follow-up appointment.  

## 2014-09-29 ENCOUNTER — Encounter: Payer: Self-pay | Admitting: *Deleted

## 2014-10-07 ENCOUNTER — Ambulatory Visit (INDEPENDENT_AMBULATORY_CARE_PROVIDER_SITE_OTHER): Payer: Medicare Other | Admitting: *Deleted

## 2014-10-07 DIAGNOSIS — I4891 Unspecified atrial fibrillation: Secondary | ICD-10-CM | POA: Diagnosis not present

## 2014-10-07 DIAGNOSIS — I482 Chronic atrial fibrillation, unspecified: Secondary | ICD-10-CM

## 2014-10-07 DIAGNOSIS — Z5181 Encounter for therapeutic drug level monitoring: Secondary | ICD-10-CM

## 2014-10-07 LAB — POCT INR: INR: 3.1

## 2014-10-13 ENCOUNTER — Other Ambulatory Visit: Payer: Self-pay | Admitting: Internal Medicine

## 2014-10-14 ENCOUNTER — Ambulatory Visit (INDEPENDENT_AMBULATORY_CARE_PROVIDER_SITE_OTHER): Payer: Medicare Other | Admitting: Internal Medicine

## 2014-10-14 ENCOUNTER — Telehealth: Payer: Self-pay | Admitting: Pulmonary Disease

## 2014-10-14 ENCOUNTER — Encounter: Payer: Self-pay | Admitting: Internal Medicine

## 2014-10-14 VITALS — BP 134/78 | HR 68 | Ht 65.5 in | Wt 201.2 lb

## 2014-10-14 DIAGNOSIS — K589 Irritable bowel syndrome without diarrhea: Secondary | ICD-10-CM | POA: Diagnosis not present

## 2014-10-14 DIAGNOSIS — K220 Achalasia of cardia: Secondary | ICD-10-CM | POA: Diagnosis not present

## 2014-10-14 DIAGNOSIS — K5010 Crohn's disease of large intestine without complications: Secondary | ICD-10-CM | POA: Diagnosis not present

## 2014-10-14 MED ORDER — HYOSCYAMINE SULFATE 0.125 MG SL SUBL: SUBLINGUAL_TABLET | SUBLINGUAL | Status: DC

## 2014-10-14 MED ORDER — MESALAMINE 1.2 G PO TBEC: 2.4000 g | DELAYED_RELEASE_TABLET | Freq: Every day | ORAL | Status: DC

## 2014-10-14 NOTE — Telephone Encounter (Signed)
Spoke with Bethanne Ginger at [facility], is checking on the status of a CMN sent on 4/7 for pt's neb meds.  I asked Bethanne Ginger to re-fax this to our main office.

## 2014-10-14 NOTE — Progress Notes (Signed)
Subjective:    Patient ID: Stephanie Cortez, female    DOB: Jun 29, 1934, 81 y.o.   MRN: [id number]  HPI Stephanie Cortez is a 81 year old female with a past medical history of segmental colitis of the left colon, diverticulosis, IBS, cricopharyngeal achalasia treated with Botox and managed by ENT, A. fib on warfarin, bronchiectasis and hypertension who seen in follow-up. She was last seen in the office on 08/20/2014. At that time we began Lialda 2.4 g daily on a regular basis and also Benefiber was recommended. Levsin was given for cramping. She returns today and states she is definitely feeling better. She is having much less left-sided abdominal discomfort with using Lialda. She tolerated this medicine well. She occasionally gets very lower left-sided abdominal discomfort which starts quickly and last for very short period of time. This feels like a spasm to her. Bowel movements have been easier to pass without blood or melena. She is also feeling more complete evacuation though occasionally still has to strain. She denies tenesmus. Denies fevers or chills. Good appetite. No weight loss. No hepatobiliary complaint.  Prior colonoscopy reviewed on 11/14/2011 -- left-sided diverticulosis without polyps.   Review of Systems as per history of present illness, otherwise negative  Current Medications, Allergies, Past Medical History, Past Surgical History, Family History and Social History were reviewed in [employer] record.      Objective:   Physical Exam BP 134/78 mmHg  Pulse 68  Ht 5' 5.5" (1.664 m)  Wt 201 lb 3.2 oz (91.264 kg)  BMI 32.96 kg/m2 Constitutional: Well-developed and well-nourished. No distress. HEENT: Normocephalic and atraumatic. Conjunctivae are normal.  No scleral icterus. Neck: Neck supple. Trachea midline. Cardiovascular: Normal rate, regular rhythm and intact distal pulses.  Pulmonary/chest: Effort normal and breath sounds normal. No wheezing,  rales or rhonchi. Abdominal: Soft, nontender, nondistended. Bowel sounds active throughout.  Extremities: no clubbing, cyanosis, or edema Lymphadenopathy: No cervical adenopathy noted. Neurological: Alert and oriented to person place and time. Skin: Skin is warm and dry. No rashes noted. Psychiatric: Normal mood and affect. Behavior is normal.  CBC    Component Value Date/Time   WBC 5.9 09/23/2013 1156   WBC 8.7 10/07/2012   RBC 4.11 09/23/2013 1156   HGB 12.9 09/23/2013 1156   HCT 39.1 09/23/2013 1156   PLT 239.0 09/23/2013 1156   MCV 95.3 09/23/2013 1156   MCH 32.3 04/07/2012 1450   MCHC 33.0 09/23/2013 1156   RDW 12.9 09/23/2013 1156   LYMPHSABS 1.4 09/23/2013 1156   MONOABS 0.6 09/23/2013 1156   EOSABS 0.3 09/23/2013 1156   BASOSABS 0.0 09/23/2013 1156    CMP     Component Value Date/Time   NA 134* 02/10/2013 1306   NA 130* 10/07/2012   K 4.4 02/10/2013 1306   CL 102 02/10/2013 1306   CO2 23 02/10/2013 1306   GLUCOSE 100* 02/10/2013 1306   BUN 10 02/10/2013 1306   BUN 8 10/07/2012   CREATININE 0.8 02/10/2013 1306   CREATININE 0.6 10/07/2012   CALCIUM 9.8 02/10/2013 1306   PROT 7.2 04/07/2012 1450   ALBUMIN 4.2 04/07/2012 1450   AST 28 10/07/2012   ALT 24 10/07/2012   ALKPHOS 87 10/07/2012   BILITOT 0.5 04/07/2012 1450   GFRNONAA 88* 04/07/2012 1450   GFRAA >90 04/07/2012 1450      Assessment & Plan:  81 year old female with a past medical history of segmental colitis of the left colon, diverticulosis, IBS, cricopharyngeal achalasia treated with Botox  and managed by ENT, A. fib on warfarin, bronchiectasis and hypertension who seen in follow-up.   1. IBS/segment colitis in left colon -- clinical improvement with Lialda 2.4 g daily. I recommended she continue this dose on a regular scheduled basis. I do think she would benefit from initiation of Benefiber. She bought this but never started it. She will start 1 tablespoon daily. Levsin can be prescribed again for  the as needed left lower quadrant cramping abdominal pain. This works well for her. She can use 1-2 tablets every 4-6 hours as needed. She is very happy with her current spots to therapy.   2. Cricopharyngeal achalasia -- getting intermittent Botox injection with Dr. [name] with excellent response in the past   3. CRC screening -- no polyps at last colonoscopy in 2013 , will likely not require additional screening colonoscopy based on age.

## 2014-10-14 NOTE — Patient Instructions (Signed)
We have given you samples of Lialda to take.  We have sent the following medications to your pharmacy for you to pick up at your convenience: [facility]  Please purchase the following medications over the counter and take as directed: Benefiber 1 tablespoon  Please follow up in 6 months with Dr Hilarie Fredrickson.

## 2014-10-18 NOTE — Telephone Encounter (Signed)
Alida have we received this CMN? Thanks.

## 2014-10-19 NOTE — Telephone Encounter (Signed)
This order was signed by Dr Lake Bells and stamped faxed on 10/18/14 to [facility] at [phone].  I will refax and let [facility] know .Verdie Mosher

## 2014-10-26 ENCOUNTER — Ambulatory Visit (INDEPENDENT_AMBULATORY_CARE_PROVIDER_SITE_OTHER): Payer: Medicare Other

## 2014-10-26 DIAGNOSIS — Z5181 Encounter for therapeutic drug level monitoring: Secondary | ICD-10-CM

## 2014-10-26 DIAGNOSIS — I4891 Unspecified atrial fibrillation: Secondary | ICD-10-CM | POA: Diagnosis not present

## 2014-10-26 LAB — POCT INR: INR: 2

## 2014-11-03 ENCOUNTER — Telehealth: Payer: Self-pay | Admitting: Internal Medicine

## 2014-11-03 NOTE — Telephone Encounter (Signed)
Per 07/12/14 OV note; #Bronchietassis with ILD pattern - stable currently  - High Resolution CT chest without contrast on ILD protocol in June 2016  - Full PFT in June 2016 - continue flutter valve and symbicort #Followup  June 2016 after above --  Order is already in [employer]. Please advise PCC's thanks

## 2014-11-04 NOTE — Telephone Encounter (Signed)
Chest ct 12/20/14@2pm  @[facility]  office pt is aware [name]

## 2014-11-23 ENCOUNTER — Ambulatory Visit (INDEPENDENT_AMBULATORY_CARE_PROVIDER_SITE_OTHER): Payer: Medicare Other | Admitting: *Deleted

## 2014-11-23 DIAGNOSIS — Z5181 Encounter for therapeutic drug level monitoring: Secondary | ICD-10-CM | POA: Diagnosis not present

## 2014-11-23 DIAGNOSIS — I4891 Unspecified atrial fibrillation: Secondary | ICD-10-CM

## 2014-11-23 LAB — POCT INR: INR: 2.1

## 2014-11-30 ENCOUNTER — Other Ambulatory Visit: Payer: Self-pay | Admitting: Internal Medicine

## 2014-12-01 NOTE — Telephone Encounter (Signed)
Yes, ok to fill.  Patient may have 10 refills.

## 2014-12-01 NOTE — Telephone Encounter (Signed)
Ok to refill? Patient stated that she is completely out of medication. Please advise. Thanks, MI

## 2014-12-02 DIAGNOSIS — H40013 Open angle with borderline findings, low risk, bilateral: Secondary | ICD-10-CM | POA: Diagnosis not present

## 2014-12-02 DIAGNOSIS — D3131 Benign neoplasm of right choroid: Secondary | ICD-10-CM | POA: Diagnosis not present

## 2014-12-02 DIAGNOSIS — Z961 Presence of intraocular lens: Secondary | ICD-10-CM | POA: Diagnosis not present

## 2014-12-02 DIAGNOSIS — H43812 Vitreous degeneration, left eye: Secondary | ICD-10-CM | POA: Diagnosis not present

## 2014-12-20 ENCOUNTER — Ambulatory Visit (INDEPENDENT_AMBULATORY_CARE_PROVIDER_SITE_OTHER): Payer: Medicare Other | Admitting: *Deleted

## 2014-12-20 ENCOUNTER — Other Ambulatory Visit: Payer: Self-pay | Admitting: Internal Medicine

## 2014-12-20 ENCOUNTER — Ambulatory Visit (INDEPENDENT_AMBULATORY_CARE_PROVIDER_SITE_OTHER)
Admission: RE | Admit: 2014-12-20 | Discharge: 2014-12-20 | Disposition: A | Discharge status: Home / Self Care | Payer: Medicare Other | Source: Ambulatory Visit | Attending: Internal Medicine | Admitting: Internal Medicine

## 2014-12-20 DIAGNOSIS — I4891 Unspecified atrial fibrillation: Secondary | ICD-10-CM

## 2014-12-20 DIAGNOSIS — J849 Interstitial pulmonary disease, unspecified: Secondary | ICD-10-CM

## 2014-12-20 DIAGNOSIS — R0602 Shortness of breath: Secondary | ICD-10-CM | POA: Diagnosis not present

## 2014-12-20 DIAGNOSIS — J479 Bronchiectasis, uncomplicated: Secondary | ICD-10-CM | POA: Diagnosis not present

## 2014-12-20 DIAGNOSIS — Z5181 Encounter for therapeutic drug level monitoring: Secondary | ICD-10-CM | POA: Diagnosis not present

## 2014-12-20 LAB — POCT INR: INR: 3.9

## 2014-12-22 ENCOUNTER — Encounter: Payer: Self-pay | Admitting: Internal Medicine

## 2014-12-22 ENCOUNTER — Ambulatory Visit (INDEPENDENT_AMBULATORY_CARE_PROVIDER_SITE_OTHER): Payer: Medicare Other | Admitting: Internal Medicine

## 2014-12-22 ENCOUNTER — Other Ambulatory Visit (INDEPENDENT_AMBULATORY_CARE_PROVIDER_SITE_OTHER): Payer: Medicare Other

## 2014-12-22 VITALS — BP 152/92 | HR 78 | Ht 66.0 in | Wt 203.0 lb

## 2014-12-22 DIAGNOSIS — J479 Bronchiectasis, uncomplicated: Secondary | ICD-10-CM

## 2014-12-22 DIAGNOSIS — J470 Bronchiectasis with acute lower respiratory infection: Secondary | ICD-10-CM | POA: Diagnosis not present

## 2014-12-22 DIAGNOSIS — J849 Interstitial pulmonary disease, unspecified: Secondary | ICD-10-CM

## 2014-12-22 LAB — PULMONARY FUNCTION TEST
DL/VA % pred: 111 %
DL/VA: 5.63 ml/min/mmHg/L
DLCO unc % pred: 63 %
DLCO unc: 17.07 ml/min/mmHg
FEF 25-75 Post: 2.27 L/sec
FEF 25-75 Pre: 1.44 L/sec
FEF2575-%Change-Post: 58 %
FEF2575-%Pred-Post: 145 %
FEF2575-%Pred-Pre: 92 %
FEV1-%Change-Post: 13 %
FEV1-%Pred-Post: 62 %
FEV1-%Pred-Pre: 55 %
FEV1-Post: 1.35 L
FEV1-Pre: 1.19 L
FEV1FVC-%Change-Post: 2 %
FEV1FVC-%Pred-Pre: 116 %
FEV6-%Change-Post: 10 %
FEV6-%Pred-Post: 55 %
FEV6-%Pred-Pre: 50 %
FEV6-Post: 1.53 L
FEV6-Pre: 1.38 L
FEV6FVC-%Pred-Post: 105 %
FEV6FVC-%Pred-Pre: 105 %
FVC-%Change-Post: 10 %
FVC-%Pred-Post: 53 %
FVC-%Pred-Pre: 47 %
FVC-Post: 1.53 L
FVC-Pre: 1.38 L
Post FEV1/FVC ratio: 88 %
Post FEV6/FVC ratio: 100 %
Pre FEV1/FVC ratio: 86 %
Pre FEV6/FVC Ratio: 100 %
RV % pred: 89 %
RV: 2.22 L
TLC % pred: 75 %
TLC: 4.04 L

## 2014-12-22 LAB — RHEUMATOID FACTOR: Rhuematoid fact SerPl-aCnc: 13 IU/mL (ref <=14)

## 2014-12-22 LAB — SEDIMENTATION RATE: Sed Rate: 29 mm/hr — ABNORMAL HIGH (ref 0–22)

## 2014-12-22 NOTE — Progress Notes (Signed)
Subjective:    Patient ID: Stephanie Cortez, female    DOB: 28-Nov-1933, 81 y.o.   MRN: [id number]  HPI  81 yo WF with known hx of Bronchietasis  Follouwp Bronchiectasis. Last ollowup Bronchiectasis. Last seen August 2010, Dec 2010, Jan 2011, FEb 2011 (AE bronciectasis), and  Oct 2011. S/p pulmonary rehab in summer 2011.  Since then had one more visit in Nov 2011 for  AE bronchiectasis. Since then doing well. Denies cough or dyspnea but feels chest is just a bit more tight than usual. . No other specific complaints.  Denies chest pain, dyspnea, orthopnea, hemoptysis, fever, n/v/d, edema, headache.  However, does admit to increased fatigue  + . Uses symbicort as needed which is around 2-3 times per week. Is requesting spirometry  >>rx  symbicort daily    OV 04/12/11:  Using symbicort prn and overall doing well. Only has minimal wheeze. She is anticipatory and pro-acitve in care. When sinus congestion develops uses netti pot and lemon-honey vapors for relief. This prevents wheezing. Typically winter is when she has AE bronchiectasis. Wants to know if she should take prn symbicort or go on daily.  >>no changes   05/23/2011 Acute OV  Complains of head congestion, clear mucus production, PND, chest congestion, cough x1week .  Taking mucinex with some help. Mucus is all clear. No fever or discolored mucus.  Worried that she will be sick over the holidays and she is leaving to go out of town.  She is using netti pot with good relief. She has increased her symbicort Twice daily  With decreased cough.  >>rx doxycycline  11/29 /2012 Follow up  Pt returns for follow up . She is feeling is better. Currently  taking doxycycline.  reports breathing has improved but still having sinuscongestion and drainage. Last ov she was given Augmentin -unfortunately has hx of PCN allergy and abx was changed prior to taking. She is tolerating abx well. Congestion and cough are much better.  No fever or chest pain.    >>No changes   06/19/2011 Acute OV Complains of wheezing, head congestion with green drainage, hacky cough x3-4days. OTC not helping. Using Netti pot rinses .  No hemotpysis . Worse in am and late at night.  Cough is getting  Worse. Does not want to be sick for the holidays-has too much to do.  Under a lot of stress with sister that is ill. Traveling back and forth to [place].     Past Med: has changed PMD to Dr Etter Sjogren.  Social: taking care of elder sister in her 29s who has now become dependent Fam hx: as in soccial. No other change   REC Continue on Symbicort 2 puffs Twice daily Until back to your baseline  Mucinex Twice daily As needed Cough/congestion  Fluids and rest  I have given you a prescription for antibiotic--Zpack To have on hold in case  You developed discolored mucus that is not improving.  Saline nasal rinses As needed  Please contact office for sooner follow up if symptoms do not improve or worsen or seek emergency care  follow up Dr. Chase Caller as planned and As needed   OV 08/01/2011  D 23 of 28 biaxin. Blocked sinus. Sinus pressure. Hx of eustachian tube grommets. No resp issues. Netti pot not working - too blocked. Denies fever, sputum, resp exacerbation, wheeze, edema, orthopnea, paroxysmal nocturnal dyspnea    Currently lungs appear ok  Our coordinator will find out who saaw you in [place]  [facility]. We will try to have you seen this week  REturn in 3 months or sooner if needed  Please continue with your medications  OV 10/30/2011  Sinus drainage copious - intolerant to netti pot  - burns, does not want nasal steroids or anti histamine. Wants to go back to ENT. This is making tickle in throat that is severe x 1 week with mucus stuck in throat and gag. CT abdomen lung cut April 2013 for abdominal pain shows clear lung fields with some basal atelectasis. Do not see any basal bronchiectasis  Past, Family, Social reviewed: gi issues, gi scope pending  Currently lungs  appear ok  Our nurse will have you see Dr in [facility] asap for sinus Issues  Take care of your gi issues  REturn in 6 months or sooner if needed  Please continue with your medications  #Overweight  - Anderson Malta will show you the [facility] lipid diet sheet and how to use it  #Followup  - 6 - 9 months or sooner if needed  - spirometry at followup   OV 04/29/2012  Overall ok. Younger sister in [place] died. She is sad. She is having  1 week of increased cough with chest tightness but no sputum or fever. Is asociated with scratchy throat. She feels antibiptics indicated because she wants to make it to funeral. She wants prednisone on standby. No other new issues. Has seen ENT  08/27/2012 Acute OV  Complains of having nasal congestion, wheezing, prod cough at times w green/yellow mucus x2-3 weeks. Has been using netti pot and flonase and symbicort .  No hemoptysis, chest pain , edema or n/v.   Doxycyline $RemoveBef'100mg'uvWlPSKVuj$  Twice daily For 7 days  Mucinex DM Twice daily As needed Cough/congestion  Fluids and rest  Saline nasal rinses As needed  Please contact office for sooner follow up if symptoms do not improve or worsen or seek emergency care  follow up Dr. Chase Caller as planned and As needed    OV 11/10/2012  Followup bronchiectasis not otherwise specified.  - Overall doing well. The past few weeks for the allergy season is having increased sinus symptoms. This also corresponds to the fact that she has not taken her Flonase as advised. For the last couple of days she feels like she is getting a bronchitis infection with yellow phlegm. She feels associated fatigue. She is scheduled trip to [place] to see her sister but she feels that she cannot make it and would prefer not to go. Mild increase in wheezing but otherwise no problems. She is compliant with Symbicort. No fever no hemoptysis   Please have PFT and CT chest  Will call with results  Will also refer to pulmonary rehab  FU in3-4 months   OV  03/10/2013 FU bronchiectasis. Here to discuss PFT and CT results    Pft 01/29/13 is similar to 2010 : fvc 1.9L/65%, fev1 1.66L/74%, ratio86/116%, No BD resopnse. TLC 80%, DLCO 72%. Mild restriction. No change sinc 2010. HAs Mixed REstriction-Obstruction with mild reduced DLCO    CT chest 01/30/13 1. Bronhciectasis - real mild and limited to right upper lobe only.   2. Evidence of asthma /obstructive lung disease on CT.   - Inspiratory and  expiratory imaging demonstrates a profound mosaic appearance of the lung parenchyma on the expiratory phase with areas of lucency interspersed with areas of ground-glass attenuation with intervening geographic margins, compatible with severe air  trapping, suggesting small airways disease. In addition, during expiratory phase imaging,  the trachea and mainstem bronchi appear slightly collapsed, which may indicate a mild tracheobronchomalacia.     3. Lung nodule seen   - LUL 1 x 1.6cm. GGO with solid component.   - 4 mm ground-glass attenuation  nodule in the left upper lobe (image 22 of series 5)  4. Coronary artery atherosclrosis seen - she denies having had cardiac stress test in past; due to see dr Caryl Comes 03/12/13 per hx  #Coronary ARtery Atherosclerosis  - please see dr Caryl Comes and discuss having cardiac stress test  #Bronchiectasis and Obstructive Lung disease  - no evidence of flare; so stop doxycycline  - have flu shot 03/10/2013  - continue symbicort  - try tudorza 1 puff twice daily - take several samples  #Left lung upper lobe nodule 1.6cm   - there is intermediate probability that this is slow growing lung cancer called BAC  - will have [facility] blood test call you and do test; test helpful if negative  - REpeat CT chest wo contrast first week Nov 2014  - might order Oncimmune lab test and PET scan depending on fu scan in Nov 2014  #SInus drainage  - take some sample of nasal steroid  - use 3% hypertonic saline spray - OTC by a company  called [facility]  #Followup   - Early November 2014 after CT chest   OV 06/08/2013  Here to discuss results. Overall stable  CT chestr Nov 2014 CT chest COMPARISON: 01/30/2013.  FINDINGS:  No pathologically enlarged mediastinal or axillary lymph nodes.  Hilar regions are difficult to definitively evaluate without IV  contrast but appear grossly unremarkable. Pulmonary arteries are  borderline enlarged. Heart is enlarged. Atherosclerotic  calcification of the arterial vasculature with involvement of the  proximal left coronary artery. No pericardial effusion.  Biapical pleural parenchymal scarring. There is an upper lobe  predominant pattern of subpleural reticulation, patchy ground-glass  and mild architectural distortion. Scattered mild bronchiectasis.  Confluent airspace opacities seen in the upper lobes on the prior  exam appear less prominent but not resolved today (example image  15). Calcified granuloma in the lingula. No pleural fluid. Airway is  unremarkable.  Incidental imaging of the upper abdomen shows no acute findings. A  peripherally calcified splenic artery aneurysm measures 9 mm, as  before. No worrisome lytic or sclerotic lesions. Degenerative  changes are seen in the spine.  IMPRESSION:  1. Mild subpleural reticulation, patchy ground-glass, scattered  bronchiectasis and architectural distortion in the upper lobes,  somewhat evolved from 01/30/2013, but without a discrete pulmonary  nodule. 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  2. Splenic artery aneurysm, stable.  Electronically Signed  By: Lorin Picket M.D.  On: 05/25/2013   OV 07/06/2013 - ACUTE VISIT  Apparently 2 months ago Dr Caryl Comes cardiologist changed some of her heart(A Fib ) med. THen over the holidays was in [facility]. On 06/29/13 had acute rise in BP associated wiuth ddyspnea and feeling clammy. REview of her hx suggests normal cath and a diagnosis of DIastolic CHF  presumably. Diuresed and she is well now. SHe is emotpinally upset over whole incident and presents with her daughter for review. SHe denies pumopnary issues. Hs fu with cards next few days  Past, Family, Social reviewed: no change since last visit  SOunds like you had Diastolic Heart Failure 81/01/75 in [place] This might have been related to medication change, salt intake, high bp Please follow with cardiology office   #FOllowup 4-5 months  from now at time of next CT chest which should be on our schedule   OV 10/12/2013  Chief Complaint  Patient presents with  . Acute Visit    Pt c/o having a productive cough with green phlegm, sinus congestion as well x  on and off x 3 months. Pt was prescribed omnicef on 10-09-13, she took 3 tablets of this and stopped due to fear of allergy.     Follow bronchiectasis  She saw my partner Dr. Legrand Como wert on 09/18/2013 acutely. Lab work showed mild eosinophilia of 300 cells per microliter but otherwise normal. She also had respiratory allergen profile that was normal. She was given a prednisone taper but apparently when she finishes her symptoms got worse. Then on 10/09/2013 she apparently had orange  sputum and Omnicef was called in. She took this for 3 days and had improvement in her symptoms and her sputum is now back to baseline mild green in color and volume. However, she scared of taking more Omnicef because of prior allergies with rash to penicillin. Nevertheless she does have some wheezing ongoing    OV 12/09/2013  Chief Complaint  Patient presents with  . Follow-up    Pt states her breathing has improved. Pt here to review HRCT. Pt c/o minimal dry cough and minimal SOB. Denies CP.     No major issues. Lot of issues at home with husband and troubled > 9 years of marriage. She is worried pulmonary issues will impact her life (same as before) but says overall she is stable.    has a past medical history of Bronchiectasis; Dyslipidemia;  Eczema; Anemia; Atrial fibrillation; Glaucoma; Back pain (2009); Anxiety; Diverticulosis; COPD (chronic obstructive pulmonary disease); Hypertension; Arthritis; IBS (irritable bowel syndrome); H. pylori infection; GERD with stricture; Hyponatremia; Hypothyroid; DDD (degenerative disc disease), lumbar; and CHF (congestive heart failure).   reports that she has never smoked. She has never used smokeless tobacco.     CLINICAL DATA: Bronchiectasis, postinflammatory fibrosis.  EXAM:  CT CHEST WITHOUT CONTRAST 11/25/13 TECHNIQUE:  Multidetector CT imaging of the chest was performed following the  standard protocol without intravenous contrast. High resolution  imaging of the lungs, as well as inspiratory and expiratory imaging,  was performed.  COMPARISON: 05/25/2013, 01/30/2013.  FINDINGS:  Mediastinal lymph nodes are not enlarged by CT size criteria. Hilar  regions are difficult to definitively evaluate without IV contrast  but appear grossly unremarkable. No axillary adenopathy. Coronary  artery calcification. Heart is enlarged with predominantly right  atrial enlargement. Decreased attenuation of the intravascular  compartment is indicative of anemia. No pericardial effusion.  There is a somewhat upper lung zone predominant pattern of scattered  patchy ground-glass with mild architectural distortion and traction  bronchiolectasis. Findings do not appear progressive from prior  exams. No honeycombing. Calcified granuloma in the lingula. There  may be subtle scattered air trapping on inspiratory and expiratory  imaging. No pleural fluid. Airway is unremarkable.  Incidental imaging of the upper abdomen shows the visualized  portions of the liver and adrenal glands to be unremarkable. A 9 mm  peripherally calcified splenic artery aneurysm is unchanged.  Visualized portions of the spleen, pancreas and stomach are  otherwise grossly unremarkable. No upper abdominal adenopathy. No  worrisome  lytic or sclerotic lesions. Degenerative changes are seen  in the spine.  IMPRESSION:  1. Upper lung zone predominant pattern of mild patchy ground-glass  with slight architectural distortion and traction bronchiolectasis,  unchanged from prior exams and indicative of  either nonspecific  interstitial pneumonitis (NSIP) or postinflammatory fibrosis.  2. Splenic artery aneurysm, stable.  3. email from Dr Rosario Jacks: I" had to go back to [facility]rikin's report on 01/30/13 to find mention of a LUL nodule.  There is some mild scarring or fibrosis in that location (series 5, image 15).  I wouldn't do any further followup for it." Electronically Signed  By: Lorin Picket M.D.  On: 11/25/2013 13:07    OV 07/12/2014 Chief Complaint  Patient presents with  . Follow-up    Pt stated she is using the flutter valve and is helping bring mucus up and breath deeper. Pt stated her breathing has improved since last OV. Pt c/o mild SOB, rhinorrhea, prod cough with clear mucus.     Follow-up mild bronchiectasis initially thought was isolated but CT scan of the chest in May 2015 shows this to be a upper lobe pattern with mild NSIP type of interstitial lung disease   Presents for routine follow-up. Since I last saw her several months ago she's had some visits here for sinus drainage and respiratory exacerbation. She is now on flutter valve along with a Symbicort given to her by one of my colleagues. She is extremely pleased with her flutter valve. Overall respirator status is stable but she is worried about sinus drainage that is moderate to severe in severity and that is constant throughout the day and is new in the last several months. CT scan of the sinuses in 2013 was normal except for mucosal thickening. She has ENT specialist Dr. [name] who she has not seen in a while. Otherwise no acute issues    OV 12/22/2014  - PCP Reginia Naas, MD   Chief Complaint  Patient presents with  . Follow-up    Pt saw BQ  for an acute visit. Pt stated she feels she has improved since last OV. Pt c/o prod cough with little white mucus, rhinorrhea, chest congestion. Pt denies CP/tightness.    Last seen jan 2016. Acute visit with [facility] march 2016. Now beter from that  . Still having chronic DOE that is worse over past few years. Clas 2. ALso cough and gi issues  Pulmonary function test 12/22/2014 FVC 1.4 L/47%, FEV1 1.2 L/55% ratio of 86 and suggestive of restriction. There is a 10%" better response with FVC and 13% postbronchodilator response to 1.35/62% with FEV1 but still suggestive of restriction. TLC is 4.04 L/75% and consistent with restriction. DLCO 17.1/62% and suggest perfusion defect suggestive of interstitial lung disease. This has DEclined snce few years ago - see below   CT chest 12/20/14 - compared to may 2015- still showing ILD/NSIP pattern. No nodes. Normal esophagus. Read by Dr Rosario Jacks. I visualized image and agree . Based on reports it appear this is a new finding since 2014 end   Walking desat test: walked 185 feet x 3 laps - HR rose from 106 -> 130 but did not deate below 96%   ACCP ILD questions in detail   - symptoms: cougn - most days, x 3-4 months, day and night, wakes up from it, mild white mucus + - Dyspnea - started fall 2015, Level 2 of 5ll notices it on level ground. Walks slower than people of her age - associated sympotoms - mild dysphagia +, Gerd  + dry eyes /mouth +, arthralgia + - associate diseases  - denies vasculitis, cvd, bleeding disease but does have IBS symptoms  - cricopharyngeal achalasia - Rx intermittent BotOx with Dr [name] ENT -  extracted from GI notes Feb 2016  - severe diverticulosis with SCAD , segmental colitis associated with diverticulosis.Colonoscopy performed on 11/14/2011 showed left-sided diverticulosis without polyps. Endoscopy was attempted on the same day but could not be performed due to trouble intubating the esophagus in the setting of cricopharyngeal  achalasia She is on Lialda  (extracted from old Chart review - sees Dr Sharlett Iles - last seen June 2014 and  Pyrtle Feb 2016). Has ongoing cramping, loose stools  - Mestinon appeared in med list 2015 based on chart review - need to know who is Rx for this and why - personaly exposures - denies tobacco or street drugs - famil hx - brother deceased age 89 with emphysema - home exposure - home is not old, denies humidifer, sauan, hot tube, birds, mold or water damage - occupational hx - worked in offices, no dust or  Pain or fine particle expsure. Circled yes for metal exposure - need to clarify. Denies farm work , Nurse, learning disability, Curator, Building control surveyor, Forensic psychologist, Engineer, maintenance (IT), vineyard work, Water quality scientist, rail road, [place] , smelting, tunnels, Child psychotherapist, Social research officer, government.,  - other exosures   - houshold cleaning with clorox +  - sister house in [place] had some mildew = June 2016 - medications   - denies chemotherapy, XRT . Amiodarone large $RemoveBeforeDE'800mg'cRlKoiUvxvdPsuX$  dosage in 2000   Serial PFT - shows progfresssion. Old traces visualized personally again this vsiti  PFT FVC fev1 ratio BD fev1 TLC DLCO  10/02/10 2.5L/77% 1.8L/76% 73/101%     01/29/2013 - similar to [place] in 2010 1.9L/65% 1.66L/74% 86/116% No bd respo 80% 72%  12/22/2014 1.38L/47% 1.19L/47% 86/116% 13% Bd in fev1 4.04L/75% 17.07L/63%   Review of Systems  Constitutional: Negative for fever and unexpected weight change.  HENT: Negative for congestion, dental problem, ear pain, nosebleeds, postnasal drip, rhinorrhea, sinus pressure, sneezing, sore throat and trouble swallowing.   Eyes: Negative for redness and itching.  Respiratory: Positive for cough and shortness of breath. Negative for chest tightness and wheezing.   Cardiovascular: Negative for palpitations and leg swelling.  Gastrointestinal: Negative for nausea and vomiting.  Genitourinary: Negative for dysuria.  Musculoskeletal: Negative for joint swelling.  Skin: Negative for rash.  Neurological: Negative for headaches.    Hematological: Does not bruise/bleed easily.  Psychiatric/Behavioral: Negative for dysphoric mood. The patient is not nervous/anxious.    Current outpatient prescriptions:  .  budesonide-formoterol (SYMBICORT) 80-4.5 MCG/ACT inhaler, INHALE 2 PUFFS INTO THE LUNGS TWICE DAILY, Disp: 10.2 g, Rfl: 5 .  cetirizine (ZYRTEC) 10 MG tablet, Take 10 mg by mouth daily as needed. , Disp: , Rfl:  .  Cholecalciferol (VITAMIN D PO), Take 1 tablet by mouth daily., Disp: , Rfl:  .  Cyanocobalamin (VITAMIN B-12 PO), Take 1 tablet by mouth daily., Disp: , Rfl:  .  FLUoxetine (PROZAC) 10 MG capsule, TAKE ONE CAPSULE BY MOUTH EVERY DAY, Disp: 90 capsule, Rfl: 0 .  furosemide (LASIX) 20 MG tablet, Take 1 tablet (20 mg total) by mouth daily as needed., Disp: 30 tablet, Rfl: 1 .  hyoscyamine (LEVSIN SL) 0.125 MG SL tablet, Dissolve 1-2 tablets under the tongue every 4-6 hours as needed for left lower quadrant abdominal pain/cramping, Disp: 60 tablet, Rfl: 2 .  levothyroxine (SYNTHROID, LEVOTHROID) 50 MCG tablet, Take 1 tablet (50 mcg total) by mouth daily., Disp: 90 tablet, Rfl: 3 .  LORazepam (ATIVAN) 0.5 MG tablet, TAKE 1 TABLET BY MOUTH EVERY 8 HOURS AS NEEDED FOR ANXIETY, Disp: 90 tablet, Rfl: 3 .  MAGNESIUM  PO, Take 1 tablet by mouth daily. , Disp: , Rfl:  .  mesalamine (LIALDA) 1.2 G EC tablet, Take 2 tablets (2.4 g total) by mouth daily., Disp: 72 tablet, Rfl: 0 .  Multiple Vitamins-Minerals (EMERGEN-C VITAMIN C PO), Take 1 tablet by mouth daily., Disp: , Rfl:  .  nebivolol (BYSTOLIC) 5 MG tablet, Take 1 tablet (5 mg total) by mouth daily., Disp: 30 tablet, Rfl: 12 .  olmesartan (BENICAR) 40 MG tablet, Take 1 tablet (40 mg total) by mouth daily., Disp: 90 tablet, Rfl: 3 .  omeprazole (PRILOSEC) 20 MG capsule, TAKE ONE CAPSULE BY MOUTH DAILY, Disp: 30 capsule, Rfl: 0 .  Potassium (POTASSIMIN) 75 MG TABS, Take 1 tablet by mouth daily. 1 tab po qd, Disp: , Rfl:  .  pravastatin (PRAVACHOL) 20 MG tablet, TAKE 1/2  TABLET BY MOUTH DAILY, Disp: 45 tablet, Rfl: 3 .  pyridostigmine (MESTINON) 60 MG tablet, TAKE 1/2 TABLET BY MOUTH TWICE DAILY, Disp: 30 tablet, Rfl: 10 .  Respiratory Therapy Supplies (FLUTTER) DEVI, Use as directed, Disp: 1 each, Rfl: 0 .  selenium 50 MCG TABS, Take 50 mcg by mouth daily., Disp: , Rfl:  .  sodium chloride (OCEAN) 0.65 % SOLN nasal spray, Place 1 spray into both nostrils as needed for congestion., Disp: , Rfl:  .  warfarin (COUMADIN) 2.5 MG tablet, Take as directed by Coumadin clinic, Disp: 35 tablet, Rfl: 3 .  Zinc 22.5 MG TABS, Take by mouth daily., Disp: , Rfl:       Objective:   Physical Exam  Constitutional: She is oriented to person, place, and time. She appears well-developed and well-nourished. No distress.  HENT:  Head: Normocephalic and atraumatic.  Right Ear: External ear normal.  Left Ear: External ear normal.  Mouth/Throat: Oropharynx is clear and moist. No oropharyngeal exudate.  Eyes: Conjunctivae and EOM are normal. Pupils are equal, round, and reactive to light. Right eye exhibits no discharge. Left eye exhibits no discharge. No scleral icterus.  Neck: Normal range of motion. Neck supple. No JVD present. No tracheal deviation present. No thyromegaly present.  Cardiovascular: Normal rate, regular rhythm, normal heart sounds and intact distal pulses.  Exam reveals no gallop and no friction rub.   No murmur heard. Pulmonary/Chest: Effort normal and breath sounds normal. No respiratory distress. She has no wheezes. She has no rales. She exhibits no tenderness.  Abdominal: Soft. Bowel sounds are normal. She exhibits no distension and no mass. There is no tenderness. There is no rebound and no guarding.  Musculoskeletal: Normal range of motion. She exhibits no edema or tenderness.  Lymphadenopathy:    She has no cervical adenopathy.  Neurological: She is alert and oriented to person, place, and time. She has normal reflexes. No cranial nerve deficit. She  exhibits normal muscle tone. Coordination normal.  Skin: Skin is warm and dry. No rash noted. She is not diaphoretic. No erythema. No pallor.  Psychiatric: She has a normal mood and affect. Her behavior is normal. Judgment and thought content normal.  Vitals reviewed.   Filed Vitals:   12/22/14 1125  BP: 152/92  Pulse: 78  Height: _0  (1.676 m)  Weight: 203 lb (92.08 kg)  SpO2: 98%         Assessment & Plan:       ICD-9-CM ICD-10-CM   1. Bronchiectasis without complication 482.7 M78.6   2. ILD (interstitial lung disease) 515 J84.9    I have known her since 2010 when she moved  from [facility] [place] with diagnosis of bronchiectasis nos and seemed to have mild restrictive/obstructive spirometry c/w her diagnosis and obesity. 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 This seemed to persist in summer 2015 and very subtle (CT initially done for nodule that since resolved). 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 IN additinon, PFTs seemed to have declined. So, making new dx call of ILD   Unclear etiology.   She has associated mestinon intake and IBS symptoms and is on mesalamine. So possibiliyt of autoimmune or vasculituis  She has hx of recc resp exacerbatio needing steroids and abx so ? HP  Other ddx is Sarcoid or NSIP  PLAN -  do Serum: ESR, ACE, ANA, DS-DNA, RF, anti-CCP, ssA, ssB, scl-70, ANCA screen, MPO, PR-3, Total CK,  RNP, Aldolase,  Hypersensitivity Pneumonitis Panel - do ONO at home - fnd out why on mestinon - need to discuss potential surgical lung bx based on autoimmune results   #Followup 1-2 weeks from 12/22/2014 for followup with me; if schedule issues will try to do special accommodation   Dr. Brand Males, M.D., [facility].C.P Pulmonary and Critical Care Medicine Staff Physician [place] [facility] Pager: [phone], If no answer or between  15:00h - 7:00h:  call [phone]  12/23/2014 1:24 AM

## 2014-12-22 NOTE — Patient Instructions (Addendum)
ICD-9-CM ICD-10-CM   1. Bronchiectasis without complication 567.0 L41.0   2. ILD (interstitial lung disease) 515 J84.9      #Bronchietassis with ILD pattern - do ACCP ILD questions now  - do Serum: ESR, ACE, ANA, DS-DNA, RF, anti-CCP, ssA, ssB, scl-70, ANCA screen, MPO, PR-3, Total CK,  RNP, Aldolase,  Hypersensitivity Pneumonitis Panel - do ONO at home   #Followup 1-2 weeks from 12/22/2014 for followup with me; if schedule issues will try to do special accommodation

## 2014-12-22 NOTE — Progress Notes (Signed)
PFT done today. 

## 2014-12-23 ENCOUNTER — Telehealth: Payer: Self-pay | Admitting: Internal Medicine

## 2014-12-23 LAB — ANCA SCREEN W REFLEX TITER: ANCA Screen: NEGATIVE

## 2014-12-23 LAB — ANTI-DNA ANTIBODY, DOUBLE-STRANDED: ds DNA Ab: <1 IU/mL

## 2014-12-23 LAB — SJOGRENS SYNDROME-B EXTRACTABLE NUCLEAR ANTIBODY: SSB (La) (ENA) Antibody, IgG: <1

## 2014-12-23 LAB — ANA: Anti Nuclear Antibody(ANA): NEGATIVE

## 2014-12-23 LAB — SJOGRENS SYNDROME-A EXTRACTABLE NUCLEAR ANTIBODY: SSA (Ro) (ENA) Antibody, IgG: <1

## 2014-12-23 LAB — MPO/PR-3 (ANCA) ANTIBODIES
Myeloperoxidase Abs: <1
Serine Protease 3: <1

## 2014-12-23 LAB — CK TOTAL AND CKMB (NOT AT ARMC)
CK, MB: 5.3 ng/mL — ABNORMAL HIGH (ref 0.0–5.0)
Relative Index: 3.5 (ref 0.0–4.0)
Total CK: 152 U/L (ref 7–177)

## 2014-12-23 LAB — RNP ANTIBODY: Ribonucleic Protein(ENA) Antibody, IgG: <1

## 2014-12-23 LAB — ANGIOTENSIN CONVERTING ENZYME: Angiotensin-Converting Enzyme: 77 U/L — ABNORMAL HIGH (ref 8–52)

## 2014-12-23 LAB — ANTI-SCLERODERMA ANTIBODY: Scleroderma (Scl-70) (ENA) Antibody, IgG: <1

## 2014-12-23 LAB — CYCLIC CITRUL PEPTIDE ANTIBODY, IGG: Cyclic Citrullin Peptide Ab: <2 U/mL (ref 0.0–5.0)

## 2014-12-23 NOTE — Telephone Encounter (Signed)
Called [facility] and spoke with [name]. Total CK is 152 and CKMB 5.3 high. Relative index was 3.5 Pravastatin is prescribed by Dr. Tamala Julian (PCP) Mestinon by Dr. Caryl Comes for her AFIB and change in her balance when she gets up to move around per pt.  Please advise thanks

## 2014-12-23 NOTE — Telephone Encounter (Signed)
[facility] called with critical lab- CKMB elevated at 5.3 Will forward to MR marked urgent

## 2014-12-23 NOTE — Telephone Encounter (Signed)
I dont see it in EPIC  Need to know  A) what is total CK in addition to CKMB B) why is she on mestinon and for how long and who put her on it? C) how long has she been pn pravachol and who is monitoring that?  THanks  Dr. Brand Males, M.D., [facility].C.P Pulmonary and Critical Care Medicine Staff Physician [place] [facility] Pager: [phone], If no answer or between  15:00h - 7:00h: call [phone]  12/23/2014 2:05 PM      Current outpatient prescriptions:  .  budesonide-formoterol (SYMBICORT) 80-4.5 MCG/ACT inhaler, INHALE 2 PUFFS INTO THE LUNGS TWICE DAILY, Disp: 10.2 g, Rfl: 5 .  cetirizine (ZYRTEC) 10 MG tablet, Take 10 mg by mouth daily as needed. , Disp: , Rfl:  .  Cholecalciferol (VITAMIN D PO), Take 1 tablet by mouth daily., Disp: , Rfl:  .  Cyanocobalamin (VITAMIN B-12 PO), Take 1 tablet by mouth daily., Disp: , Rfl:  .  FLUoxetine (PROZAC) 10 MG capsule, TAKE ONE CAPSULE BY MOUTH EVERY DAY, Disp: 90 capsule, Rfl: 0 .  furosemide (LASIX) 20 MG tablet, Take 1 tablet (20 mg total) by mouth daily as needed., Disp: 30 tablet, Rfl: 1 .  hyoscyamine (LEVSIN SL) 0.125 MG SL tablet, Dissolve 1-2 tablets under the tongue every 4-6 hours as needed for left lower quadrant abdominal pain/cramping, Disp: 60 tablet, Rfl: 2 .  levothyroxine (SYNTHROID, LEVOTHROID) 50 MCG tablet, Take 1 tablet (50 mcg total) by mouth daily., Disp: 90 tablet, Rfl: 3 .  LORazepam (ATIVAN) 0.5 MG tablet, TAKE 1 TABLET BY MOUTH EVERY 8 HOURS AS NEEDED FOR ANXIETY, Disp: 90 tablet, Rfl: 3 .  MAGNESIUM PO, Take 1 tablet by mouth daily. , Disp: , Rfl:  .  mesalamine (LIALDA) 1.2 G EC tablet, Take 2 tablets (2.4 g total) by mouth daily., Disp: 72 tablet, Rfl: 0 .  Multiple Vitamins-Minerals (EMERGEN-C VITAMIN C PO), Take 1 tablet by mouth daily., Disp: , Rfl:  .  nebivolol (BYSTOLIC) 5 MG tablet, Take 1 tablet (5 mg total) by mouth daily., Disp: 30 tablet, Rfl: 12 .   olmesartan (BENICAR) 40 MG tablet, Take 1 tablet (40 mg total) by mouth daily., Disp: 90 tablet, Rfl: 3 .  omeprazole (PRILOSEC) 20 MG capsule, TAKE ONE CAPSULE BY MOUTH DAILY, Disp: 30 capsule, Rfl: 0 .  Potassium (POTASSIMIN) 75 MG TABS, Take 1 tablet by mouth daily. 1 tab po qd, Disp: , Rfl:  .  pravastatin (PRAVACHOL) 20 MG tablet, TAKE 1/2 TABLET BY MOUTH DAILY, Disp: 45 tablet, Rfl: 3 .  pyridostigmine (MESTINON) 60 MG tablet, TAKE 1/2 TABLET BY MOUTH TWICE DAILY, Disp: 30 tablet, Rfl: 10 .  Respiratory Therapy Supplies (FLUTTER) DEVI, Use as directed, Disp: 1 each, Rfl: 0 .  selenium 50 MCG TABS, Take 50 mcg by mouth daily., Disp: , Rfl:  .  sodium chloride (OCEAN) 0.65 % SOLN nasal spray, Place 1 spray into both nostrils as needed for congestion., Disp: , Rfl:  .  warfarin (COUMADIN) 2.5 MG tablet, Take as directed by Coumadin clinic, Disp: 35 tablet, Rfl: 3 .  Zinc 22.5 MG TABS, Take by mouth daily., Disp: , Rfl:

## 2014-12-24 NOTE — Telephone Encounter (Signed)
Pt calling to get result of test.Stephanie Cortez

## 2014-12-24 NOTE — Telephone Encounter (Signed)
Pt requesting rec's based on results today.  Please advise Dr Chase Caller. Thanks.

## 2014-12-24 NOTE — Telephone Encounter (Signed)
Pt aware that we will call her once MR gives rec's on results.  Please advise. Thanks.

## 2014-12-24 NOTE — Telephone Encounter (Signed)
Patient aware of results and rec's per MR. Nothing further needed.

## 2014-12-24 NOTE — Telephone Encounter (Signed)
HEr CK is borderline hihg like this for a long time. Maybe related to statins. She needs to talk about it with PCP Stephanie Naas, MD.   Other autoimmune all pending; not sure when it will come back

## 2014-12-25 LAB — ALDOLASE: Aldolase: 5.6 U/L (ref <=8.1)

## 2014-12-27 ENCOUNTER — Other Ambulatory Visit: Payer: Self-pay

## 2014-12-27 LAB — HYPERSENSITIVITY PNEUMONITIS
A. Pullulans Abs: NEGATIVE
A.Fumigatus #1 Abs: NEGATIVE
Micropolyspora faeni, IgG: NEGATIVE
Pigeon Serum Abs: NEGATIVE
Thermoact. Saccharii: NEGATIVE
Thermoactinomyces vulgaris, IgG: NEGATIVE

## 2014-12-29 ENCOUNTER — Telehealth: Payer: Self-pay | Admitting: Internal Medicine

## 2014-12-29 DIAGNOSIS — J849 Interstitial pulmonary disease, unspecified: Secondary | ICD-10-CM

## 2014-12-29 NOTE — Telephone Encounter (Signed)
I called spoke with Stephanie Cortez. She is requesting lab work from 6/22. Nothing in epic yet. Called [facility] to get these faxed over to triage. Will await fax Stephanie Cortez had PFT done 12/22/14. She is going to [place] 01/06/15-01/14/15, will be flying 2 hr flight. She wants to make sure she would be able to fly? Please advise MR thanks

## 2014-12-29 NOTE — Telephone Encounter (Signed)
Please call [name]. This patient and another one we are hvavoing problems. I recommend you fax the results to elink [phone] to my attention 12/29/2014 or 12/30/14 and I cn take a look. Send message back to me please

## 2014-12-29 NOTE — Telephone Encounter (Signed)
Faxed results to MR in [place] at [phone].

## 2014-12-29 NOTE — Telephone Encounter (Addendum)
Stephanie Cortez  In bold below for you  Thanks  .............................................[name] Kitchen   Lab 12/21/14  Spoke to patient and gavie hjer the bleow   - CK MB high - chronically high  - I told her to talk to PCP [facility], MD abut this and statin use as a cause  - ACE - elevated 77 - so sarcoid is apossible cause of ILD  - Other autoimmen and vasculitis - RF, CCP, ANA, DS DNA, RNP, ANCA, MPO, PR3, ssA, ssB  - all negative   Etiology for ILD  - doubt cancer  -could be sarcoid or NSIP  Severity  - per Dr Rosario Jacks - NSIP present since 2014 and is unchanged . So, possible current PFT worsening could be red herring  REC  - ok to go to [facility] (she ddid not drop o2 with exertion) - so ok to fly  - avoid biopsy now of lung - REFER PULM REHAB NOW - do FULL PFT in  24months - [name] please order - ROV with me in 3 months afterPFT; bring family member for discussion - Nocturnal o2 1L [name] - to help with desats at night that are only mild 3% of her sleep (on o < / = 88% for 14 min total - I DID NOT HAVE THIS INFO WHEN I CALLED HER). No rush ins tarting this and no need o2 for plane travel or for use in [place]  Dr. Brand Males, M.D., [facility].C.P Pulmonary and Critical Care Medicine Staff Physician [place] [facility] Pager: [phone], If no answer or between  15:00h - 7:00h: call [phone]  12/29/2014 4:57 PM

## 2014-12-30 ENCOUNTER — Telehealth: Payer: Self-pay | Admitting: Internal Medicine

## 2014-12-30 NOTE — Telephone Encounter (Signed)
lmtcb X1 for pt to make aware of below bolded recs.

## 2014-12-30 NOTE — Addendum Note (Signed)
Addended by: Maurice March on: 12/30/2014 11:54 AM   Modules accepted: Orders

## 2014-12-30 NOTE — Telephone Encounter (Addendum)
Called and spoke to pt. Informed pt of the recs per MR. Orders and recall placed. Pt verbalized understanding and denied any further questions or concerns at this time.

## 2014-12-30 NOTE — Telephone Encounter (Signed)
I looked in MR look at and did not see results. Called [place] and she is out of the office until tomorrow AM. WCB to get results faxed

## 2014-12-30 NOTE — Telephone Encounter (Signed)
Pt returning call can be reached @ [phone].Stephanie Cortez

## 2014-12-31 NOTE — Telephone Encounter (Signed)
Called and spoke to [place] at [facility]. [facility] does not have pt's ONO on file. Spoke with Suanne Marker, [facility], and per the pt's chart the ONO order was confirmed by Rodena Piety at [facility]. ONO was sent down to be scanned earlier in the week. Medical records called to have pt's ONO scanned asap in the computer to see what DME company did ONO. Will await scan document.

## 2014-12-31 NOTE — Telephone Encounter (Signed)
Spoke with Stephanie Cortez at [facility]. Pt had ONO at [facility]. [facility] will get ONO from [facility] and begin pt's O2 therapy. Nothing further needed at this time.

## 2015-01-05 ENCOUNTER — Ambulatory Visit (INDEPENDENT_AMBULATORY_CARE_PROVIDER_SITE_OTHER): Payer: Medicare Other | Admitting: *Deleted

## 2015-01-05 DIAGNOSIS — Z5181 Encounter for therapeutic drug level monitoring: Secondary | ICD-10-CM

## 2015-01-05 DIAGNOSIS — I4891 Unspecified atrial fibrillation: Secondary | ICD-10-CM

## 2015-01-05 LAB — POCT INR: INR: 3

## 2015-01-17 ENCOUNTER — Encounter: Payer: Self-pay | Admitting: Internal Medicine

## 2015-01-21 ENCOUNTER — Telehealth (HOSPITAL_COMMUNITY): Payer: Self-pay

## 2015-01-21 NOTE — Telephone Encounter (Signed)
Called patient regarding entrance to Pulmonary Rehab.  Patient states that they are interested in attending the program.  Adriann is going to verify insurance coverage and follow up.    

## 2015-01-27 ENCOUNTER — Ambulatory Visit (INDEPENDENT_AMBULATORY_CARE_PROVIDER_SITE_OTHER): Payer: Medicare Other

## 2015-01-27 DIAGNOSIS — I4891 Unspecified atrial fibrillation: Secondary | ICD-10-CM

## 2015-01-27 DIAGNOSIS — Z5181 Encounter for therapeutic drug level monitoring: Secondary | ICD-10-CM

## 2015-01-27 LAB — POCT INR: INR: 2.3

## 2015-01-31 ENCOUNTER — Encounter (HOSPITAL_COMMUNITY)
Admission: RE | Admit: 2015-01-31 | Discharge: 2015-01-31 | Disposition: A | Discharge status: Home / Self Care | Payer: Medicare Other | Source: Ambulatory Visit | Attending: Internal Medicine | Admitting: Internal Medicine

## 2015-01-31 VITALS — BP 150/76 | HR 72

## 2015-01-31 DIAGNOSIS — J849 Interstitial pulmonary disease, unspecified: Secondary | ICD-10-CM | POA: Diagnosis not present

## 2015-01-31 NOTE — Progress Notes (Signed)
Stephanie Cortez 81 y.o. female Pulmonary Rehab Orientation Note Patient arrived today in Cardiac and Pulmonary Rehab for orientation to Pulmonary Rehab. She was transported from [employer] via wheel chair. She does not carry portable oxygen. Per pt, she uses oxygen at night when going to sleep. Color good, skin warm and dry. Patient is oriented to time and place. Patient's medical history and medications reviewed. Heart rate is normal, breath sounds clear to auscultation, no wheezes, rales, or rhonchi. She does have a history of atrial fib and sees Dr. Caryl Comes and is on coumadin.   Grip strength equal, strong. Distal pulses 3+ bilateral posterior tibial pulses present. Patient reports she does take medications as prescribed. Patient states she follows a Regular diet. The patient reports no specific efforts to gain or lose weight..  Patient's weight will be monitored closely. Demonstration and practice of PLB using pulse oximeter.Patient has been through pulmonary rehab in 2011 and is familiar with purse lip breathing and can demonstrate. Safety and hand hygiene in the exercise area reviewed with patient. Patient voices understanding of the information reviewed. Department expectations discussed with patient and achievable goals were set. The patient shows enthusiasm about attending the program and we look forward to working with this nice lady. The patient is scheduled for a 6 min walk test on Thursday, February 03, 2015 @ 3:30pm and to begin exercise on Tuesday, February 08, 2015 in the 1030 class 1200-1430.

## 2015-02-01 DIAGNOSIS — I10 Essential (primary) hypertension: Secondary | ICD-10-CM | POA: Diagnosis not present

## 2015-02-01 DIAGNOSIS — I480 Paroxysmal atrial fibrillation: Secondary | ICD-10-CM | POA: Diagnosis not present

## 2015-02-01 DIAGNOSIS — J479 Bronchiectasis, uncomplicated: Secondary | ICD-10-CM | POA: Diagnosis not present

## 2015-02-01 DIAGNOSIS — F439 Reaction to severe stress, unspecified: Secondary | ICD-10-CM | POA: Diagnosis not present

## 2015-02-03 ENCOUNTER — Ambulatory Visit (HOSPITAL_COMMUNITY): Payer: Medicare Other

## 2015-02-08 ENCOUNTER — Encounter (HOSPITAL_COMMUNITY): Payer: Medicare Other

## 2015-02-08 ENCOUNTER — Encounter (HOSPITAL_COMMUNITY)
Admission: RE | Admit: 2015-02-08 | Discharge: 2015-02-08 | Disposition: A | Discharge status: Home / Self Care | Payer: Medicare Other | Source: Ambulatory Visit | Attending: Internal Medicine | Admitting: Internal Medicine

## 2015-02-08 DIAGNOSIS — J849 Interstitial pulmonary disease, unspecified: Secondary | ICD-10-CM | POA: Diagnosis not present

## 2015-02-08 NOTE — Progress Notes (Signed)
Stephanie Cortez completed a Six-Minute Walk Test on 02/08/15 . Stephanie Cortez walked 922 feet with 0 breaks.  The patient's lowest oxygen saturation was 90 %, highest heart rate was 122 bpm , and highest blood pressure was 168/90. The patient was on room air. Patient stated that lightheadedness hindered their walk test.

## 2015-02-10 ENCOUNTER — Encounter (HOSPITAL_COMMUNITY): Payer: Medicare Other

## 2015-02-15 ENCOUNTER — Encounter (HOSPITAL_COMMUNITY): Payer: Medicare Other

## 2015-02-15 ENCOUNTER — Telehealth: Payer: Self-pay | Admitting: Internal Medicine

## 2015-02-15 NOTE — Telephone Encounter (Signed)
New message     Pt has questions regarding some of her medications Please call to discuss

## 2015-02-15 NOTE — Telephone Encounter (Addendum)
Patient started out needing BP control advice.  Informed her to contact PCP on recommendations, and that Dr Caryl Comes does not typically advise on this matter. She verbalized understanding. She also is concerned that since starting Mestinon she has experienced "floaters" in her eyes. She would like to know what to do about this. Informed that it may be end of month/beginning of next month before this is addressed and she is agreeable to plan.

## 2015-02-17 ENCOUNTER — Encounter (HOSPITAL_COMMUNITY): Payer: Medicare Other

## 2015-02-17 NOTE — Telephone Encounter (Signed)
Reviewed with Dr. Caryl Comes - orders to stop Mestinon and follow up in a couple of weeks to see how patient is doing and determine if follow up is needed. Explained orders to patient who verbalized understanding.   I will touch base with her in several weeks.

## 2015-02-18 ENCOUNTER — Other Ambulatory Visit: Payer: Self-pay | Admitting: Internal Medicine

## 2015-02-20 ENCOUNTER — Telehealth: Payer: Self-pay | Admitting: Internal Medicine

## 2015-02-20 NOTE — Telephone Encounter (Signed)
Pls let Jolene Provost daughter [name] know that I received her card dates 12/29/14. Read it only 02/20/2015 . I appreciate her kind words of appreciation

## 2015-02-22 ENCOUNTER — Encounter (HOSPITAL_COMMUNITY): Payer: Medicare Other

## 2015-02-22 NOTE — Telephone Encounter (Signed)
Left detailed message on [name]'s VM thanking her for the card and to call if there is anything else needed. Nothing further needed at this time.

## 2015-02-24 ENCOUNTER — Encounter (HOSPITAL_COMMUNITY)
Admission: RE | Admit: 2015-02-24 | Discharge: 2015-02-24 | Disposition: A | Discharge status: Home / Self Care | Payer: Medicare Other | Source: Ambulatory Visit | Attending: Internal Medicine | Admitting: Internal Medicine

## 2015-02-24 DIAGNOSIS — J849 Interstitial pulmonary disease, unspecified: Secondary | ICD-10-CM | POA: Diagnosis not present

## 2015-02-24 NOTE — Progress Notes (Signed)
Today, Stephanie Cortez exercised at [employer]. [facility]. Service time was from 1330 to 1545.  The patient exercised by performing aerobic, strengthening, and stretching exercises. Oxygen saturation, heart rate, blood pressure, rate of perceived exertion, and shortness of breath were all monitored before, during, and after exercise. [name] presented with no problems at today's exercise session.  Patient attended the Oxygen Safety class today.  The patient did not have an increase in workload intensity during today's exercise session.  Pre-exercise vitals: . Weight kg: 87.6 . Liters of O2: RA . SpO2: 97 . HR: 102 . BP: 136/80 . CBG: na  Exercise vitals: . Highest heartrate:  119 . Lowest oxygen saturation: 96 . Highest blood pressure: 146/80 . Liters of 02: RA  Post-exercise vitals: . SpO2: 99 . HR: 93 . BP: 134/78 . Liters of O2: RA . CBG: na Dr. Brand Males, Medical Director Dr. Candiss Norse is immediately available during today's Pulmonary Rehab session for Stephanie Cortez on 02/24/2015  at 1330 class time.  [name] Kitchen

## 2015-02-25 ENCOUNTER — Ambulatory Visit (INDEPENDENT_AMBULATORY_CARE_PROVIDER_SITE_OTHER): Payer: Medicare Other | Admitting: *Deleted

## 2015-02-25 DIAGNOSIS — H1013 Acute atopic conjunctivitis, bilateral: Secondary | ICD-10-CM | POA: Diagnosis not present

## 2015-02-25 DIAGNOSIS — D3131 Benign neoplasm of right choroid: Secondary | ICD-10-CM | POA: Diagnosis not present

## 2015-02-25 DIAGNOSIS — I4891 Unspecified atrial fibrillation: Secondary | ICD-10-CM

## 2015-02-25 DIAGNOSIS — Z5181 Encounter for therapeutic drug level monitoring: Secondary | ICD-10-CM

## 2015-02-25 DIAGNOSIS — H40013 Open angle with borderline findings, low risk, bilateral: Secondary | ICD-10-CM | POA: Diagnosis not present

## 2015-02-25 DIAGNOSIS — Z961 Presence of intraocular lens: Secondary | ICD-10-CM | POA: Diagnosis not present

## 2015-02-25 LAB — POCT INR: INR: 3.3

## 2015-03-01 ENCOUNTER — Encounter: Payer: Self-pay | Admitting: Internal Medicine

## 2015-03-01 ENCOUNTER — Encounter (HOSPITAL_COMMUNITY): Payer: Medicare Other

## 2015-03-01 ENCOUNTER — Ambulatory Visit (INDEPENDENT_AMBULATORY_CARE_PROVIDER_SITE_OTHER): Payer: Medicare Other | Admitting: Internal Medicine

## 2015-03-01 VITALS — BP 142/80 | HR 80 | Ht 65.5 in | Wt 189.6 lb

## 2015-03-01 DIAGNOSIS — F418 Other specified anxiety disorders: Secondary | ICD-10-CM

## 2015-03-01 DIAGNOSIS — K5010 Crohn's disease of large intestine without complications: Secondary | ICD-10-CM

## 2015-03-01 DIAGNOSIS — I10 Essential (primary) hypertension: Secondary | ICD-10-CM

## 2015-03-01 DIAGNOSIS — K5730 Diverticulosis of large intestine without perforation or abscess without bleeding: Secondary | ICD-10-CM

## 2015-03-01 DIAGNOSIS — K589 Irritable bowel syndrome without diarrhea: Secondary | ICD-10-CM | POA: Diagnosis not present

## 2015-03-01 DIAGNOSIS — F064 Anxiety disorder due to known physiological condition: Secondary | ICD-10-CM

## 2015-03-01 MED ORDER — MESALAMINE 1.2 G PO TBEC: 2.4000 g | DELAYED_RELEASE_TABLET | Freq: Every day | ORAL | Status: DC

## 2015-03-01 NOTE — Progress Notes (Signed)
Subjective:    Patient ID: Stephanie Cortez, female    DOB: 1933-12-23, 81 y.o.   MRN: [id number]  HPI Teirra Carapia is a 81 year old female with history of segmental colitis associated with diverticulosis in the left colon, IBS, cricopharyngeal achalasia treated with Botox and managed by ENT, A. fib on warfarin, bronchiectasis with recent diagnosis of interstitial lung disease, hypertension who is here for follow-up. She was last seen in April 2016. Today she follows up because she would like to discuss medication and her concern that they may be negatively affecting her. She admits to being quite anxious recently particularly with the new diagnosis of interstitial lung disease. She was having issues with her hyoscyamine causing floaters in her vision and also blurry vision. She stopped this about 3 weeks ago and visual symptoms have improved. She also stopped pyridostigmine which was started by another physician. She has continued on Lialda 2.4 g daily for her history of segmental colitis. This did improve her lower abdominal pain. She is still having occasional lower abdominal warmness before bowel movement relieved by bowel movement. This is not described as pain or pressure. Bowel movements have been fairly regular occurring 1 or 2 times a day became be urgent. She denies sharp left-sided abdominal pain. She has lost about 10 pounds for unclear reasons. She reports her bowel movements have been formed, nonbloody and without melena. He is concerned about Benicar because she read this can negatively affect your got. She would like to discuss this with Dr. Caryl Comes but she reports meeting resistance when contacting his staff. She has started a long rehabilitation program under the direction of Dr. Chase Caller after diagnosis of ILD.  Last colonoscopy 11/14/2011  -- left-sided diverticulosis without polyps    Review of Systems  as per history of present illness, otherwise negative   Current Medications,  Allergies, Past Medical History, Past Surgical History, Family History and Social History were reviewed in [employer] record.     Objective:   Physical Exam BP 142/80 mmHg  Pulse 80  Ht 5' 5.5" (1.664 m)  Wt 189 lb 9.6 oz (86.002 kg)  BMI 31.06 kg/m2 Constitutional: Well-developed and well-nourished. No distress. HEENT: Normocephalic and atraumatic. [name] Kitchen Conjunctivae are normal.  No scleral icterus. Neck: Neck supple. Trachea midline. Cardiovascular: Irregularly irregular Pulmonary/chest: Effort normal and breath sounds normal. Abdominal: Soft, nontender, nondistended. Bowel sounds active throughout. Extremities: no clubbing, cyanosis, or edema Neurological: Alert and oriented to person place and time. Skin: Skin is warm and dry. No rashes noted. Psychiatric: Normal mood and affect. Behavior is normal.      Assessment & Plan:  81 year old female with history of segmental colitis associated with diverticulosis in the left colon, IBS, cricopharyngeal achalasia treated with Botox and managed by ENT, A. fib on warfarin, bronchiectasis with recent diagnosis of interstitial lung disease, hypertension who is here for follow-up.  1. IBS and diverticulosis with segmental colitis of the left colon -- from GI perspective she is doing fairly well though it seems that she was having some visual disturbance with Levsin. This is been stopped which was appropriate. I've encouraged her to continue Lialda 2.4 g daily given her overall clinical improvement of colitis 1 starting this medication. She is happy to continue this medication.   2. ILD -- causing significant anxiety with new diagnosis. She follows up with pulmonary soon.   3. Anxiety -- I do think she is having anxiety over her overall medical condition and we discussed this  today. Some of this is due to the unknown with a new diagnosis of ILD.  I encouraged her to speak to her primary physician about her anxiety if it  continues to bother her   4. Hypertension  -- we discussed how Benicar can cause a sprue like enteropathy.  We talked about this today and I do not think this medication is causing any of her GI symptoms. That said, she would like to consider another antihypertension agent if possible. I will involve Dr. Caryl Comes to help make this decision, possible to suggest another ARB to replace olmesartan.   Follow-up in 4-5 months, sooner if necessary 25 minutes spent with patient today

## 2015-03-01 NOTE — Patient Instructions (Signed)
Dr. Hilarie Fredrickson will contact Dr.Klein about your Benicar.   Continue Lialda 2.4 g daily. A refill has been sent to your pharmacy.   Stop taking Levsin.   Follow-up in four months or sooner if needed.

## 2015-03-02 ENCOUNTER — Ambulatory Visit (INDEPENDENT_AMBULATORY_CARE_PROVIDER_SITE_OTHER): Payer: Medicare Other | Admitting: Internal Medicine

## 2015-03-02 DIAGNOSIS — J849 Interstitial pulmonary disease, unspecified: Secondary | ICD-10-CM | POA: Diagnosis not present

## 2015-03-02 LAB — PULMONARY FUNCTION TEST
DL/VA % pred: 103 %
DL/VA: 5.21 ml/min/mmHg/L
DLCO unc % pred: 62 %
DLCO unc: 16.9 ml/min/mmHg
FEF 25-75 Post: 1.58 L/sec
FEF 25-75 Pre: 1.52 L/sec
FEF2575-%Change-Post: 3 %
FEF2575-%Pred-Post: 102 %
FEF2575-%Pred-Pre: 98 %
FEV1-%Change-Post: 3 %
FEV1-%Pred-Post: 71 %
FEV1-%Pred-Pre: 68 %
FEV1-Post: 1.54 L
FEV1-Pre: 1.48 L
FEV1FVC-%Change-Post: 7 %
FEV1FVC-%Pred-Pre: 106 %
FEV6-%Change-Post: 3 %
FEV6-%Pred-Post: 66 %
FEV6-%Pred-Pre: 64 %
FEV6-Post: 1.82 L
FEV6-Pre: 1.77 L
FEV6FVC-%Change-Post: 0 %
FEV6FVC-%Pred-Post: 105 %
FEV6FVC-%Pred-Pre: 105 %
FVC-%Change-Post: -2 %
FVC-%Pred-Post: 63 %
FVC-%Pred-Pre: 65 %
FVC-Post: 1.82 L
FVC-Pre: 1.88 L
Post FEV1/FVC ratio: 84 %
Post FEV6/FVC ratio: 100 %
Pre FEV1/FVC ratio: 79 %
Pre FEV6/FVC Ratio: 100 %
RV % pred: 83 %
RV: 2.08 L
TLC % pred: 71 %
TLC: 3.84 L

## 2015-03-02 NOTE — Progress Notes (Signed)
PFT done today. 

## 2015-03-03 ENCOUNTER — Encounter (HOSPITAL_COMMUNITY)
Admission: RE | Admit: 2015-03-03 | Discharge: 2015-03-03 | Disposition: A | Discharge status: Home / Self Care | Payer: Medicare Other | Source: Ambulatory Visit | Attending: Internal Medicine | Admitting: Internal Medicine

## 2015-03-03 ENCOUNTER — Encounter (HOSPITAL_COMMUNITY): Payer: Medicare Other

## 2015-03-03 ENCOUNTER — Ambulatory Visit (HOSPITAL_COMMUNITY): Payer: Medicare Other

## 2015-03-03 DIAGNOSIS — J849 Interstitial pulmonary disease, unspecified: Secondary | ICD-10-CM | POA: Diagnosis not present

## 2015-03-03 NOTE — Progress Notes (Signed)
Today, Stephanie Cortez exercised at [employer]. [facility]. Service time was from 1330 to 1510.  The patient exercised by performing aerobic, strengthening, and stretching exercises. Oxygen saturation, heart rate, blood pressure, rate of perceived exertion, and shortness of breath were all monitored before, during, and after exercise. Stephanie Cortez presented with no problems at today's exercise session. Stephanie Cortez also attended an education session on diaphragmatic and pursed lip breathing.  The patient did not have an increase in workload intensity during today's exercise session.  Pre-exercise vitals: . Weight kg: 87.1 . Liters of O2: ra . SpO2: 98 . HR: 81 . BP: 140/70 . CBG: na  Exercise vitals: . Highest heartrate:  104 . Lowest oxygen saturation: 95 . Highest blood pressure: 140/80 . Liters of 02: ra  Post-exercise vitals: . SpO2: 96 . HR: 94 . BP: 130/70 . Liters of O2: ra . CBG: na  Dr. Brand Males, Medical Director Dr. Carles Collet is immediately available during today's Pulmonary Rehab session for Stephanie Cortez on 03/03/2015 at 1330 class time.

## 2015-03-08 ENCOUNTER — Encounter (HOSPITAL_COMMUNITY): Payer: Medicare Other

## 2015-03-08 ENCOUNTER — Encounter (HOSPITAL_COMMUNITY)
Admission: RE | Admit: 2015-03-08 | Discharge: 2015-03-08 | Disposition: A | Discharge status: Home / Self Care | Payer: Medicare Other | Source: Ambulatory Visit | Attending: Internal Medicine | Admitting: Internal Medicine

## 2015-03-08 DIAGNOSIS — J849 Interstitial pulmonary disease, unspecified: Secondary | ICD-10-CM | POA: Diagnosis not present

## 2015-03-08 NOTE — Progress Notes (Signed)
Today, Jerine exercised at [employer]. [facility]. Service time was from 1330 to 1500.  The patient exercised by performing aerobic, strengthening, and stretching exercises. Oxygen saturation, heart rate, blood pressure, rate of perceived exertion, and shortness of breath were all monitored before, during, and after exercise. Stephanie Cortez presented with no problems at today's exercise session.  The patient did a have an increase in workload intensity during today's exercise session.  Pre-exercise vitals: . Weight kg: 86.4 . Liters of O2: ra . SpO2: 99 . HR: 83 . BP: 170/74, down to 162/72 with rest . CBG: na  Exercise vitals: . Highest heartrate:  112 . Lowest oxygen saturation: 97 . Highest blood pressure: 150/68 . Liters of 02: ra  Post-exercise vitals: . SpO2: 97 . HR: 87 . BP: 130/62 . Liters of O2: ra . CBG: na  Dr. Brand Males, Medical Director Dr. Carles Collet is immediately available during today's Pulmonary Rehab session for Jolene Provost on 03/08/2015 at 1330 class time.

## 2015-03-10 ENCOUNTER — Encounter (HOSPITAL_COMMUNITY)
Admission: RE | Admit: 2015-03-10 | Discharge: 2015-03-10 | Disposition: A | Discharge status: Home / Self Care | Payer: Medicare Other | Source: Ambulatory Visit | Attending: Internal Medicine | Admitting: Internal Medicine

## 2015-03-10 ENCOUNTER — Encounter (HOSPITAL_COMMUNITY): Payer: Medicare Other

## 2015-03-10 DIAGNOSIS — J849 Interstitial pulmonary disease, unspecified: Secondary | ICD-10-CM | POA: Diagnosis not present

## 2015-03-10 NOTE — Progress Notes (Signed)
Stephanie Cortez 81 y.o. female Nutrition Note Spoke with pt. Pt is obese. Pt wants to lose wt. Pt wt is down 2 kg over the last month. Pt eats 3 meals a day; most prepared at home. Pt reports she is a night owl and tends to snack on peanut butter and/or crackers in the evening. Pt is making healthy food choices the majority of the time.  Pt's Rate Your Plate results reviewed with pt. Pt does not avoid salty food due "low sodium levels." Pt uses canned food and adds salt to food.  The role of sodium in lung disease reviewed with pt. Pt expressed understanding of the information reviewed. Pt with dx of CHF. Pt reports she is not on a fluid restriction. Pt expressed understanding of the information reviewed.   Nutrition Diagnosis ? Food-and nutrition-related knowledge deficit related to lack of exposure to information as related to diagnosis of pulmonary disease ? Obesity related to excessive energy intake as evidenced by a BMI of 30.5  Nutrition Rx/Est. Daily Nutrition Needs for: ? wt loss 1200-1600 Kcal  70-85 gm protein    Nutrition Intervention ? Pt's individual nutrition plan and goals reviewed with pt. ? Benefits of adopting healthy eating habits discussed when pt's Rate Your Plate reviewed. ? Pt to attend the Nutrition and Lung Disease class ? Continual client-centered nutrition education by RD, as part of interdisciplinary care. Goal(s) 1. Identify food quantities necessary to achieve wt loss of  -2# per week to a goal wt of 77.5-85.7 kg (170-180 lb) at graduation from pulmonary rehab. Monitor and Evaluate progress toward nutrition goal with team.   Derek Mound, M.Ed, RD, LDN, CDE 03/10/2015 3:18 PM

## 2015-03-10 NOTE — Progress Notes (Signed)
Today, Stephanie Cortez exercised at [facility]. Service time was from 1330 to 1530.  The patient exercised by performing aerobic, strengthening, and stretching exercises. Oxygen saturation, heart rate, blood pressure, rate of perceived exertion, and shortness of breath were all monitored before, during, and after exercise. Stephanie Cortez presented with no problems at today's exercise session.  Patient attended the [place] class today..    The patient did  have an increase in workload intensity during today's exercise session.  Pre-exercise vitals: . Weight kg: 85.8 . Liters of O2: ra . SpO2: 97 . HR: 96 . BP: 150/72 . CBG: na  Exercise vitals: . Highest heartrate:  88 . Lowest oxygen saturation: 96 . Highest blood pressure: 120/80 . Liters of 02: ra  Post-exercise vitals: . SpO2: 97 . HR: 80 . BP: 130/82 . Liters of O2: ra . CBG: na Dr. Brand Males, Medical Director Dr. Candiss Norse is immediately available during today's Pulmonary Rehab session for Stephanie Cortez on 03/10/2015  at 1330 class time.  [name] Kitchen

## 2015-03-15 ENCOUNTER — Encounter (HOSPITAL_COMMUNITY): Payer: Medicare Other

## 2015-03-15 ENCOUNTER — Telehealth: Payer: Self-pay | Admitting: Internal Medicine

## 2015-03-15 ENCOUNTER — Telehealth (HOSPITAL_COMMUNITY): Payer: Self-pay | Admitting: *Deleted

## 2015-03-15 ENCOUNTER — Encounter (HOSPITAL_COMMUNITY)
Admission: RE | Admit: 2015-03-15 | Discharge: 2015-03-15 | Disposition: A | Discharge status: Home / Self Care | Payer: Medicare Other | Source: Ambulatory Visit | Attending: Internal Medicine | Admitting: Internal Medicine

## 2015-03-15 DIAGNOSIS — J849 Interstitial pulmonary disease, unspecified: Secondary | ICD-10-CM | POA: Diagnosis not present

## 2015-03-15 NOTE — Telephone Encounter (Signed)
New message      Pt c/o BP issue: STAT if pt c/o blurred vision, one-sided weakness or slurred speech  1. What are your last 5 BP readings? 170/100, 170/74, 162/70  2. Are you having any other symptoms (ex. Dizziness, headache, blurred vision, passed out)? no 3. What is your BP issue? Elevated bp during rehab.  Pt has not missed any bp medications. Please call pt and let her know what to do.  Joan leaves at 4 today and she is off on wed.

## 2015-03-15 NOTE — Progress Notes (Signed)
Today, Stephanie Cortez exercised at [facility]. Service time was from 1330 to 1510.  The patient exercised by performing aerobic, strengthening, and stretching exercises. Oxygen saturation, heart rate, blood pressure, rate of perceived exertion, and shortness of breath were all monitored before, during, and after exercise. Rosea presented with no problems at today's exercise session.  The patient did not have an increase in workload intensity during today's exercise session.  Pre-exercise vitals: . Weight kg: 85.4 . Liters of O2: ra . SpO2: 97 . HR: 96 . BP: 162/70, recheck after rest 124/70 . CBG: na  Exercise vitals: . Highest heartrate:  122 . Lowest oxygen saturation: ra . Highest blood pressure: 176/100, recheck 142/94 . Liters of 02: ra  Post-exercise vitals: . SpO2: 95 . HR: 93 . BP: 136/80 . Liters of O2: ra . CBG: na  Dr. Brand Males, Medical Director Dr. Candiss Norse is immediately available during today's Pulmonary Rehab session for Stephanie Cortez on 03/15/2015 at 1330 class time.

## 2015-03-17 ENCOUNTER — Encounter (HOSPITAL_COMMUNITY): Payer: Medicare Other

## 2015-03-18 ENCOUNTER — Ambulatory Visit (INDEPENDENT_AMBULATORY_CARE_PROVIDER_SITE_OTHER): Payer: Medicare Other | Admitting: Pharmacist

## 2015-03-18 DIAGNOSIS — Z5181 Encounter for therapeutic drug level monitoring: Secondary | ICD-10-CM | POA: Diagnosis not present

## 2015-03-18 DIAGNOSIS — I4891 Unspecified atrial fibrillation: Secondary | ICD-10-CM | POA: Diagnosis not present

## 2015-03-18 LAB — POCT INR: INR: 3.8

## 2015-03-18 NOTE — Telephone Encounter (Signed)
Late entry- I spoke with the patient around 5:45 pm on 9/13.  Time spent on the phone with the patient was ~ 30 minutes. I called to advise her we had received the message from Cardiac Rehab about her BP and that Dr. Caryl Comes had made recommendations to have her increase her Bystolic from 5 mg daily to 7.5 mg daily. She proceeded to tell me that she is very upset about the management of her BP. She felt that Dr. Caryl Comes was going to be the one to manage this. She then reports that she spoke with a staff member in our office that told her Dr. Caryl Comes was a "highly trained physician" and that he wouldn't be the one to manage her BP.  She explained that she went back to see her PCP and told her the comment that was made to her from our office and they were both very surprised to hear this.  She states that Dr. Caryl Comes was recommended to be her cardiologist from her previous doctor prior to moving to town. She feels that she should not see him on once a year especially with her BP issues. The patient states that Dr. Hilarie Fredrickson was also supposed to be in contact with Dr. Caryl Comes about her BP. I advised the patient I had seen Dr. Vena Rua note and that he felt her BP needed to be looked at by Dr. Caryl Comes, but also explained to her that we did not see a copy of the note forwarded to Dr. Caryl Comes in his in-basket, and no call was received by Dr. Hilarie Fredrickson. Since the patient is very disheartened by the care she is currently receiving from our practice, I advised her I would speak with Dr. Caryl Comes and see is he can speak with her PCP about who will be managing her BP control. I explained to her that cardiology in general can manage BP, but if this is the only issue, then typically, cardiology will request that the patient's PCP manage this. The patient was appreciative of the call and that I will speak with Dr. Caryl Comes and we will determine who will follow her BP. I advised her to please make the change in her bystolic as recommended by  Dr. Caryl Comes. I will call her back on Monday after I review further with Dr. Caryl Comes.  I also explained we could make a follow up appointment for her to see him before the end of the year. The patient was very appreciative of the call. I will follow up with her on Monday to see how her BP is doing, discuss who will manage her BP, and schedule follow up with Dr. Caryl Comes prior to the end of the year.

## 2015-03-21 NOTE — Telephone Encounter (Signed)
Dr. Caryl Comes called and spoke with the patient to follow up regarding her BP- this is somewhat improved on increased bystolic dose. He did relay to her that her BP would be best managed through her PCP and she is agreeable with this. A referral was also received for evaluation of vascular disease with Dr. Livingston Diones- per Dr. Caryl Comes, the patient wishes to defer this until she follows up with Dr. Caryl Comes in December. I will forward a message to Lorenda Hatchet to please call the patient to schedule follow up.

## 2015-03-22 ENCOUNTER — Encounter (HOSPITAL_COMMUNITY): Payer: Medicare Other

## 2015-03-22 ENCOUNTER — Encounter (HOSPITAL_COMMUNITY)
Admission: RE | Admit: 2015-03-22 | Discharge: 2015-03-22 | Disposition: A | Discharge status: Home / Self Care | Payer: Medicare Other | Source: Ambulatory Visit | Attending: Internal Medicine | Admitting: Internal Medicine

## 2015-03-22 ENCOUNTER — Other Ambulatory Visit: Payer: Self-pay | Admitting: Internal Medicine

## 2015-03-22 DIAGNOSIS — J849 Interstitial pulmonary disease, unspecified: Secondary | ICD-10-CM | POA: Diagnosis not present

## 2015-03-22 NOTE — Progress Notes (Signed)
Today, Stephanie Cortez exercised at [facility]. Service time was from 1330 to 1500.  The patient exercised by performing aerobic, strengthening, and stretching exercises. Oxygen saturation, heart rate, blood pressure, rate of perceived exertion, and shortness of breath were all monitored before, during, and after exercise. Andretta presented with no problems at today's exercise session.  The patient did have an increase in workload intensity during today's exercise session.  Pre-exercise vitals: . Weight kg: 86.5 . Liters of O2: ra . SpO2: 97 . HR: 98 . BP: 142/80 . CBG: na  Exercise vitals: . Highest heartrate: 124 . Lowest oxygen saturation: 95 . Highest blood pressure: 156/80 . Liters of 02: ra  Post-exercise vitals: . SpO2: 96 . HR: 80 . BP: 134/70 . Liters of O2: ra . CBG: na  Dr. Brand Males, Medical Director Dr. Carles Collet is immediately available during today's Pulmonary Rehab session for Stephanie Cortez on 03/22/2015 at 1330 class time.

## 2015-03-24 ENCOUNTER — Encounter (HOSPITAL_COMMUNITY): Payer: Medicare Other

## 2015-03-24 ENCOUNTER — Ambulatory Visit (INDEPENDENT_AMBULATORY_CARE_PROVIDER_SITE_OTHER): Payer: Medicare Other | Admitting: Internal Medicine

## 2015-03-24 ENCOUNTER — Encounter: Payer: Self-pay | Admitting: Internal Medicine

## 2015-03-24 VITALS — BP 148/90 | HR 93 | Ht 65.5 in | Wt 191.2 lb

## 2015-03-24 DIAGNOSIS — Z23 Encounter for immunization: Secondary | ICD-10-CM

## 2015-03-24 DIAGNOSIS — J849 Interstitial pulmonary disease, unspecified: Secondary | ICD-10-CM | POA: Diagnosis not present

## 2015-03-24 DIAGNOSIS — J479 Bronchiectasis, uncomplicated: Secondary | ICD-10-CM

## 2015-03-24 NOTE — Progress Notes (Signed)
Subjective:    Patient ID: Stephanie Cortez, female    DOB: 06-27-1934, 81 y.o.   MRN: [id number]  HPI     OV 03/24/2015  Chief Complaint  Patient presents with  . Follow-up    Pt here after PFT in 01/2015. Pt using nocturnal O2 and has noticed a difference the next day in her breathing. Pt states she has a prod cough with discolored mucus, PND, sinus pressure.    Stephanie Cortez is here for follow-up with her daughter Stephanie Cortez, they are presenting for review of ILD workup. I'd always followed up with her baseline diagnosis of bronchiectasis but it started becoming more apparent that she actually has ILD. 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 According to the radiologist it has been stable since 2014 at least. Autoimmune workup showed elevated angiotensin-converting enzyme and CK but otherwise normal. Since the diagnosis of elevated CK her primary care physician has stopped her statin. There is no follow-up CK on this. We also notices that she had nocturnal desaturations restarted her on oxygen and sent her to pulmonary rehabilitation. These 2 measures haven't helped her immensely. She still continues a Symbicort for the prior diagnosis of bronchiectasis. She is inclined to continue that. She will have a flu shot today. She wants to know more about interstitial lung disease and had lot of questions about it     Current outpatient prescriptions:  .  budesonide-formoterol (SYMBICORT) 80-4.5 MCG/ACT inhaler, INHALE 2 PUFFS INTO THE LUNGS TWICE DAILY, Disp: 10.2 g, Rfl: 5 .  cetirizine (ZYRTEC) 10 MG tablet, Take 10 mg by mouth daily as needed. , Disp: , Rfl:  .  Cholecalciferol (VITAMIN D PO), Take 1 tablet by mouth daily., Disp: , Rfl:  .  Cyanocobalamin (VITAMIN B-12 PO), Take 1 tablet by mouth daily., Disp: , Rfl:  .  FLUoxetine (PROZAC) 10 MG capsule, TAKE ONE CAPSULE BY MOUTH EVERY DAY, Disp: 90 capsule, Rfl: 0 .  levothyroxine (SYNTHROID, LEVOTHROID) 50 MCG tablet,  Take 1 tablet (50 mcg total) by mouth daily., Disp: 90 tablet, Rfl: 3 .  MAGNESIUM PO, Take 1 tablet by mouth daily. , Disp: , Rfl:  .  mesalamine (LIALDA) 1.2 G EC tablet, Take 2 tablets (2.4 g total) by mouth daily., Disp: 60 tablet, Rfl: 3 .  nebivolol (BYSTOLIC) 5 MG tablet, Take 1 & 1/2 tablets (7.5 mg) by mouth once daily, Disp: , Rfl:  .  olmesartan (BENICAR) 40 MG tablet, Take 1 tablet (40 mg total) by mouth daily., Disp: 90 tablet, Rfl: 3 .  omeprazole (PRILOSEC) 20 MG capsule, Take 1 capsule (20 mg total) by mouth daily., Disp: 30 capsule, Rfl: 3 .  OXYGEN, Inhale into the lungs. 2 Liters only at night, Disp: , Rfl:  .  Potassium (POTASSIMIN) 75 MG TABS, Take 1 tablet by mouth daily. 1 tab po qd, Disp: , Rfl:  .  Respiratory Therapy Supplies (FLUTTER) DEVI, Use as directed, Disp: 1 each, Rfl: 0 .  selenium 50 MCG TABS, Take 50 mcg by mouth daily., Disp: , Rfl:  .  sodium chloride (OCEAN) 0.65 % SOLN nasal spray, Place 1 spray into both nostrils as needed for congestion., Disp: , Rfl:  .  warfarin (COUMADIN) 2.5 MG tablet, Take as directed by Coumadin clinic, Disp: 35 tablet, Rfl: 3 .  Zinc 22.5 MG TABS, Take by mouth daily., Disp: , Rfl:  .  furosemide (LASIX) 20 MG tablet, Take 1 tablet (20  mg total) by mouth daily as needed. (Patient not taking: Reported on 03/24/2015), Disp: 30 tablet, Rfl: 1    Immunization History  Administered Date(s) Administered  . Influenza Split 04/06/2011, 04/03/2012  . Influenza Whole 06/01/2009, 04/03/2010  . Influenza,inj,Quad PF,36+ Mos 03/10/2013  . Influenza-Unspecified 04/01/2014  . Pneumococcal Polysaccharide-23 07/04/2006      Review of Systems  Constitutional: Negative for fever and unexpected weight change.  HENT: Negative for congestion, dental problem, ear pain, nosebleeds, postnasal drip, rhinorrhea, sinus pressure, sneezing, sore throat and trouble swallowing.   Eyes: Negative for redness and itching.  Respiratory: Positive for cough  and shortness of breath. Negative for chest tightness and wheezing.   Cardiovascular: Negative for palpitations and leg swelling.  Gastrointestinal: Negative for nausea and vomiting.  Genitourinary: Negative for dysuria.  Musculoskeletal: Negative for joint swelling.  Skin: Negative for rash.  Neurological: Negative for headaches.  Hematological: Does not bruise/bleed easily.  Psychiatric/Behavioral: Negative for dysphoric mood. The patient is not nervous/anxious.        Objective:   Physical Exam  Constitutional: She is oriented to person, place, and time. She appears well-developed and well-nourished. No distress.  HENT:  Head: Normocephalic and atraumatic.  Right Ear: External ear normal.  Left Ear: External ear normal.  Mouth/Throat: Oropharynx is clear and moist. No oropharyngeal exudate.  Eyes: Conjunctivae and EOM are normal. Pupils are equal, round, and reactive to light. Right eye exhibits no discharge. Left eye exhibits no discharge. No scleral icterus.  Neck: Normal range of motion. Neck supple. No JVD present. No tracheal deviation present. No thyromegaly present.  Cardiovascular: Normal rate, regular rhythm, normal heart sounds and intact distal pulses.  Exam reveals no gallop and no friction rub.   No murmur heard. Pulmonary/Chest: Effort normal and breath sounds normal. No respiratory distress. She has no wheezes. She has no rales. She exhibits no tenderness.  Scattered crackles +  Abdominal: Soft. Bowel sounds are normal. She exhibits no distension and no mass. There is no tenderness. There is no rebound and no guarding.  Musculoskeletal: Normal range of motion. She exhibits no edema or tenderness.  Lymphadenopathy:    She has no cervical adenopathy.  Neurological: She is alert and oriented to person, place, and time. She has normal reflexes. No cranial nerve deficit. She exhibits normal muscle tone. Coordination normal.  Skin: Skin is warm and dry. No rash noted. She  is not diaphoretic. No erythema. No pallor.  Psychiatric: She has a normal mood and affect. Her behavior is normal. Judgment and thought content normal.  Vitals reviewed.    Filed Vitals:   03/24/15 1514  BP: 148/90  Pulse: 93  Height: 5' 5.5" (1.664 m)  Weight: 191 lb 3.2 oz (86.728 kg)  SpO2: 98%         Assessment & Plan:     ICD-9-CM ICD-10-CM   1. ILD (interstitial lung disease) 515 J84.9   2. 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 Brand Males, M.D., [facility]C.P Pulmonary and Critical Care Medicine Staff Physician [facility]  Pulmonary and [facility] Care Pager: [phone], If no answer or between  15:00h - 7:00h: call [phone]  03/24/2015 3:48 PM

## 2015-03-24 NOTE — Patient Instructions (Signed)
ICD-9-CM ICD-10-CM   1. ILD (interstitial lung disease) 515 J84.9   2. 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

## 2015-03-25 ENCOUNTER — Other Ambulatory Visit: Payer: Self-pay | Admitting: *Deleted

## 2015-03-25 MED ORDER — NEBIVOLOL HCL 5 MG PO TABS: ORAL_TABLET | ORAL | Status: DC

## 2015-03-28 ENCOUNTER — Telehealth: Payer: Self-pay

## 2015-03-28 NOTE — Telephone Encounter (Signed)
Prior auth for Bystolic 5mg  1 1/2 tabs daily sent to [facility]

## 2015-03-29 ENCOUNTER — Telehealth: Payer: Self-pay

## 2015-03-29 ENCOUNTER — Encounter (HOSPITAL_COMMUNITY)
Admission: RE | Admit: 2015-03-29 | Discharge: 2015-03-29 | Disposition: A | Discharge status: Home / Self Care | Payer: Medicare Other | Source: Ambulatory Visit | Attending: Internal Medicine | Admitting: Internal Medicine

## 2015-03-29 ENCOUNTER — Encounter (HOSPITAL_COMMUNITY): Payer: Medicare Other

## 2015-03-29 DIAGNOSIS — J849 Interstitial pulmonary disease, unspecified: Secondary | ICD-10-CM | POA: Diagnosis not present

## 2015-03-29 NOTE — Progress Notes (Signed)
Today, Stephanie Cortez exercised at [facility]. Service time was from 1330 to 1500.  The patient exercised by performing aerobic, strengthening, and stretching exercises. Oxygen saturation, heart rate, blood pressure, rate of perceived exertion, and shortness of breath were all monitored before, during, and after exercise. Stephanie Cortez presented with no problems at today's exercise session.  The patient did not have an increase in workload intensity during today's exercise session.  Pre-exercise vitals: . Weight kg: 87.2 . Liters of O2: ra . SpO2: 99 . HR: 86 . BP: 156/70 . CBG: na  Exercise vitals: . Highest heartrate:  113 . Lowest oxygen saturation: 94 . Highest blood pressure: 134/80 . Liters of 02: ra  Post-exercise vitals: . SpO2: 96 . HR: 96 . BP: 128/80 . Liters of O2: ra . CBG: na  Dr. Brand Males, Medical Director Dr. Ree Kida is immediately available during today's Pulmonary Rehab session for Stephanie Cortez on 03/29/2015 at 1330 class time.

## 2015-03-29 NOTE — Telephone Encounter (Signed)
Bystolic approved but only till 07/02/2015. [id number].

## 2015-03-30 ENCOUNTER — Other Ambulatory Visit: Payer: Self-pay | Admitting: Internal Medicine

## 2015-03-31 ENCOUNTER — Encounter (HOSPITAL_COMMUNITY)
Admission: RE | Admit: 2015-03-31 | Discharge: 2015-03-31 | Disposition: A | Discharge status: Home / Self Care | Payer: Medicare Other | Source: Ambulatory Visit | Attending: Internal Medicine | Admitting: Internal Medicine

## 2015-03-31 ENCOUNTER — Encounter (HOSPITAL_COMMUNITY): Payer: Medicare Other

## 2015-03-31 DIAGNOSIS — J849 Interstitial pulmonary disease, unspecified: Secondary | ICD-10-CM | POA: Diagnosis not present

## 2015-03-31 NOTE — Progress Notes (Deleted)
Today, Stephanie Cortez exercised at [facility]. Service time was from 1330 to 1530.  The patient exercised by performing aerobic, strengthening, and stretching exercises. Oxygen saturation, heart rate, blood pressure, rate of perceived exertion, and shortness of breath were all monitored before, during, and after exercise. Stephanie Cortez presented with no problems at today's exercise session. Stephanie Cortez also attended an education session on anatomy and physiology of the respiratory system.  The patient did not have an increase in workload intensity during today's exercise session.  Pre-exercise vitals: . Weight kg: 84.9 . Liters of O2: 2L . SpO2: 92 . HR: 71 . BP: 100/58 . CBG: na  Exercise vitals: . Highest heartrate:  75 . Lowest oxygen saturation: 89 . Highest blood pressure: 114/64 . Liters of 02: 4L  Post-exercise vitals: . SpO2: 92 . HR: 69 . BP: 96/62 . Liters of O2: 3L . CBG: na  Dr. Brand Males, Medical Director Dr. Carles Collet is immediately available during today's Pulmonary Rehab session for Stephanie Cortez on 03/31/2015 at 1330 class time.

## 2015-03-31 NOTE — Progress Notes (Signed)
Today, Min exercised at [facility]. Service time was from 1330 to 1530.  The patient exercised by performing aerobic, strengthening, and stretching exercises. Oxygen saturation, heart rate, blood pressure, rate of perceived exertion, and shortness of breath were all monitored before, during, and after exercise. Stephanie Cortez presented with no problems at today's exercise session. Stephanie Cortez also attended an education session on anatomy and physiology on the respiratory system.  The patient did not have an increase in workload intensity during today's exercise session.  Pre-exercise vitals: . Weight kg: 87.7 . Liters of O2: ra . SpO2: 97 . HR: 85 . BP: 132/70 . CBG: na  Exercise vitals: . Highest heartrate:  112 . Lowest oxygen saturation: 94 . Highest blood pressure: 170/90 . Liters of 02: ra  Post-exercise vitals: . SpO2: 96 . HR: 79 . BP: 142/70 . Liters of O2: ra CBG: na  Dr. Brand Males, Medical Director Dr. Carles Collet is immediately available during today's Pulmonary Rehab session for Stephanie Cortez on 03/31/2015 at 1330 class time.

## 2015-04-01 ENCOUNTER — Ambulatory Visit (INDEPENDENT_AMBULATORY_CARE_PROVIDER_SITE_OTHER): Payer: Medicare Other | Admitting: *Deleted

## 2015-04-01 DIAGNOSIS — I4891 Unspecified atrial fibrillation: Secondary | ICD-10-CM

## 2015-04-01 DIAGNOSIS — Z5181 Encounter for therapeutic drug level monitoring: Secondary | ICD-10-CM

## 2015-04-01 LAB — POCT INR: INR: 2

## 2015-04-05 ENCOUNTER — Encounter (HOSPITAL_COMMUNITY): Payer: Medicare Other

## 2015-04-07 ENCOUNTER — Encounter (HOSPITAL_COMMUNITY)
Admission: RE | Admit: 2015-04-07 | Discharge: 2015-04-07 | Disposition: A | Discharge status: Home / Self Care | Payer: Medicare Other | Source: Ambulatory Visit | Attending: Internal Medicine | Admitting: Internal Medicine

## 2015-04-07 ENCOUNTER — Encounter (HOSPITAL_COMMUNITY): Payer: Medicare Other

## 2015-04-07 DIAGNOSIS — J849 Interstitial pulmonary disease, unspecified: Secondary | ICD-10-CM | POA: Insufficient documentation

## 2015-04-07 NOTE — Progress Notes (Signed)
Today, Bryanna exercised at [facility]. Service time was from 1:30pm to 3:30pm.  The patient exercised by performing aerobic, strengthening, and stretching exercises. Oxygen saturation, heart rate, blood pressure, rate of perceived exertion, and shortness of breath were all monitored before, during, and after exercise. Kresta presented with no problems at today's exercise session. The patient attended education today with Derek Mound on Nutrition for the Pulmonary Patient.  The patient did have an increase in workload intensity during today's exercise session.  Pre-exercise vitals: . Weight kg: 87.0 . Liters of O2: ra . SpO2: 96 . HR: 76 . BP: 140/80 . CBG: na  Exercise vitals: . Highest heartrate:  116 . Lowest oxygen saturation: 95 . Highest blood pressure: 158/70 . Liters of 02: ra  Post-exercise vitals: . SpO2: 95 . HR: 82 . BP: 136/80 . Liters of O2: ra . CBG: na  Dr. Brand Males, Medical Director Dr. Allyson Sabal is immediately available during today's Pulmonary Rehab session for Jolene Provost on 04/07/15 at 1:30pm class time

## 2015-04-12 ENCOUNTER — Encounter (HOSPITAL_COMMUNITY)
Admission: RE | Admit: 2015-04-12 | Discharge: 2015-04-12 | Disposition: A | Discharge status: Home / Self Care | Payer: Medicare Other | Source: Ambulatory Visit | Attending: Internal Medicine | Admitting: Internal Medicine

## 2015-04-12 ENCOUNTER — Encounter (HOSPITAL_COMMUNITY): Payer: Medicare Other

## 2015-04-12 DIAGNOSIS — J849 Interstitial pulmonary disease, unspecified: Secondary | ICD-10-CM | POA: Diagnosis not present

## 2015-04-12 NOTE — Progress Notes (Signed)
Today, Stephanie Cortez exercised at [facility]. Service time was from 1330 to 1500.  The patient exercised by performing aerobic, strengthening, and stretching exercises. Oxygen saturation, heart rate, blood pressure, rate of perceived exertion, and shortness of breath were all monitored before, during, and after exercise. Stephanie Cortez presented with no problems at today's exercise session.  The patient did not have an increase in workload intensity during today's exercise session.  Pre-exercise vitals: . Weight kg: 86.1 . Liters of O2: RA . SpO2: 97 . HR: 89 . BP: 142/70 . CBG: NA  Exercise vitals: . Highest heartrate:  120 . Lowest oxygen saturation: 95 . Highest blood pressure: 160/82 . Liters of 02: RA  Post-exercise vitals: . SpO2: 96 . HR: 90 . BP: 138/80 . Liters of O2: RA . CBG: NA Dr. Brand Males, Medical Director Dr. Marily Memos is immediately available during today's Pulmonary Rehab session for Stephanie Cortez on 04/12/2015  at 1330 class time.  [name] Kitchen

## 2015-04-12 NOTE — Progress Notes (Signed)
I have reviewed a Home Exercise Prescription with Stephanie Cortez . Stephanie Cortez is not currently exercising at home.  The patient was advised to walk 2-3 days a week for 30 minutes.  Stephanie Cortez and I discussed how to progress their exercise prescription.  The patient stated that their goals were to sleep right, eat right, and improve outcomes.  The patient stated that they understand the exercise prescription.  We reviewed exercise guidelines, target heart rate during exercise, oxygen use, weather, home pulse oximeter, endpoints for exercise, and goals.  Patient is encouraged to come to me with any questions. I will continue to follow up with the patient to assist them with progression and safety.

## 2015-04-14 ENCOUNTER — Encounter (HOSPITAL_COMMUNITY): Payer: Medicare Other

## 2015-04-14 ENCOUNTER — Encounter (HOSPITAL_COMMUNITY)
Admission: RE | Admit: 2015-04-14 | Discharge: 2015-04-14 | Disposition: A | Discharge status: Home / Self Care | Payer: Medicare Other | Source: Ambulatory Visit | Attending: Internal Medicine | Admitting: Internal Medicine

## 2015-04-14 DIAGNOSIS — J849 Interstitial pulmonary disease, unspecified: Secondary | ICD-10-CM | POA: Diagnosis not present

## 2015-04-14 NOTE — Progress Notes (Signed)
Today, Giordana exercised at [facility]. Service time was from 1330 to 1530.  The patient exercised by performing aerobic, strengthening, and stretching exercises. Oxygen saturation, heart rate, blood pressure, rate of perceived exertion, and shortness of breath were all monitored before, during, and after exercise. Carena presented with no problems at today's exercise session.She attended risk factor reduction class today.  The patient did not have an increase in workload intensity during today's exercise session.  Pre-exercise vitals: . Weight kg: 87.1 . Liters of O2: RA . SpO2: 97 . HR: 85 . BP: 128/70 . CBG: NA  Exercise vitals: . Highest heartrate:  121 . Lowest oxygen saturation: 94 . Highest blood pressure: 136/74 . Liters of 02: RA  Post-exercise vitals: . SpO2: 96 . HR: 97 . BP: 114/86 . Liters of O2: RA . CBG: NA Dr. Brand Males, Medical Director Dr. Tamala Julian is immediately available during today's Pulmonary Rehab session for Jolene Provost on 04/14/2015  at 1330 class time.  [name] Kitchen

## 2015-04-19 ENCOUNTER — Encounter (HOSPITAL_COMMUNITY)
Admission: RE | Admit: 2015-04-19 | Discharge: 2015-04-19 | Disposition: A | Discharge status: Home / Self Care | Payer: Medicare Other | Source: Ambulatory Visit | Attending: Internal Medicine | Admitting: Internal Medicine

## 2015-04-19 ENCOUNTER — Encounter (HOSPITAL_COMMUNITY): Payer: Medicare Other

## 2015-04-19 DIAGNOSIS — J849 Interstitial pulmonary disease, unspecified: Secondary | ICD-10-CM | POA: Diagnosis not present

## 2015-04-19 NOTE — Progress Notes (Signed)
Today, Stephanie Cortez exercised at [facility]. Service time was from 1330 to 1500.  The patient exercised by performing aerobic, strengthening, and stretching exercises. Oxygen saturation, heart rate, blood pressure, rate of perceived exertion, and shortness of breath were all monitored before, during, and after exercise. Stephanie Cortez presented with a sinus headach at today's exercise session.  She exercised, but did not feel well and her blood pressure was elevated possibly due to the sinus pain she was having.  I called Dr. Hal Hope Smith's office and made her an appointment with Dr. Hosie Poisson for tomorrow 04/20/2015 @ 0930.  She will go see him tomorrow.  Her pain was an 8-10, lying down relieves it.    The patient did not have an increase in workload intensity during today's exercise session.  Pre-exercise vitals: . Weight kg: 86.8 . Liters of O2: ra . SpO2: 98 . HR: 84 . BP: 150/82 . CBG: na  Exercise vitals: . Highest heartrate:  112 . Lowest oxygen saturation: 93 . Highest blood pressure: 185/101 . Liters of 02: ra  Post-exercise vitals: . SpO2: 96 . HR: 78 . BP: 136/76 . Liters of O2: ra . CBG: na Dr. Brand Males, Medical Director Dr. Tamala Julian is immediately available during today's Pulmonary Rehab session for Stephanie Cortez on 04/19/2015  at 1330 class time  .

## 2015-04-20 DIAGNOSIS — J321 Chronic frontal sinusitis: Secondary | ICD-10-CM | POA: Diagnosis not present

## 2015-04-20 DIAGNOSIS — I10 Essential (primary) hypertension: Secondary | ICD-10-CM | POA: Diagnosis not present

## 2015-04-21 ENCOUNTER — Encounter (HOSPITAL_COMMUNITY): Payer: Medicare Other

## 2015-04-22 ENCOUNTER — Ambulatory Visit (INDEPENDENT_AMBULATORY_CARE_PROVIDER_SITE_OTHER): Payer: Medicare Other | Admitting: Pharmacist

## 2015-04-22 DIAGNOSIS — Z5181 Encounter for therapeutic drug level monitoring: Secondary | ICD-10-CM | POA: Diagnosis not present

## 2015-04-22 DIAGNOSIS — I4891 Unspecified atrial fibrillation: Secondary | ICD-10-CM | POA: Diagnosis not present

## 2015-04-22 LAB — POCT INR: INR: 2.2

## 2015-04-25 ENCOUNTER — Telehealth: Payer: Self-pay | Admitting: *Deleted

## 2015-04-25 MED ORDER — WARFARIN SODIUM 2.5 MG PO TABS: ORAL_TABLET | ORAL | Status: DC

## 2015-04-25 NOTE — Telephone Encounter (Signed)
Pt called needing coumadin refill

## 2015-04-25 NOTE — Telephone Encounter (Signed)
Warfarin refilled to pharmacy on record.

## 2015-04-26 ENCOUNTER — Encounter (HOSPITAL_COMMUNITY): Payer: Medicare Other

## 2015-04-28 ENCOUNTER — Encounter (HOSPITAL_COMMUNITY): Payer: Medicare Other

## 2015-05-03 ENCOUNTER — Encounter (HOSPITAL_COMMUNITY): Admission: RE | Admit: 2015-05-03 | Payer: Medicare Other | Source: Ambulatory Visit

## 2015-05-03 ENCOUNTER — Encounter (HOSPITAL_COMMUNITY): Payer: Medicare Other

## 2015-05-03 ENCOUNTER — Other Ambulatory Visit: Payer: Self-pay | Admitting: Family Medicine

## 2015-05-03 DIAGNOSIS — I10 Essential (primary) hypertension: Secondary | ICD-10-CM | POA: Diagnosis not present

## 2015-05-03 DIAGNOSIS — M79604 Pain in right leg: Secondary | ICD-10-CM | POA: Diagnosis not present

## 2015-05-03 DIAGNOSIS — E785 Hyperlipidemia, unspecified: Secondary | ICD-10-CM | POA: Diagnosis not present

## 2015-05-05 ENCOUNTER — Encounter (HOSPITAL_COMMUNITY): Payer: Medicare Other

## 2015-05-09 ENCOUNTER — Ambulatory Visit
Admission: RE | Admit: 2015-05-09 | Discharge: 2015-05-09 | Disposition: A | Discharge status: Home / Self Care | Payer: Medicare Other | Source: Ambulatory Visit | Attending: Family Medicine | Admitting: Family Medicine

## 2015-05-09 DIAGNOSIS — I739 Peripheral vascular disease, unspecified: Secondary | ICD-10-CM | POA: Diagnosis not present

## 2015-05-09 DIAGNOSIS — M79604 Pain in right leg: Secondary | ICD-10-CM

## 2015-05-10 ENCOUNTER — Encounter (HOSPITAL_COMMUNITY): Payer: Medicare Other

## 2015-05-10 ENCOUNTER — Encounter (HOSPITAL_COMMUNITY)
Admission: RE | Admit: 2015-05-10 | Discharge: 2015-05-10 | Disposition: A | Discharge status: Home / Self Care | Payer: Medicare Other | Source: Ambulatory Visit | Attending: Internal Medicine | Admitting: Internal Medicine

## 2015-05-10 DIAGNOSIS — J849 Interstitial pulmonary disease, unspecified: Secondary | ICD-10-CM | POA: Insufficient documentation

## 2015-05-10 NOTE — Progress Notes (Signed)
Today, Stephanie Cortez exercised at [employer]. Service time was from 1:30pm to 2:55pm.  The patient exercised by performing aerobic, strengthening, and stretching exercises. Oxygen saturation, heart rate, blood pressure, rate of perceived exertion, and shortness of breath were all monitored before, during, and after exercise. Stephanie Cortez presented with no problems at today's exercise session.  The patient did not have an increase in workload intensity during today's exercise session.  Pre-exercise vitals: . Weight kg: 90.4 . Liters of O2: a . SpO2: 100 . HR: 85 . BP: 140/70 . CBG: na  Exercise vitals: . Highest heartrate:  118 . Lowest oxygen saturation: 93 . Highest blood pressure: 160/86 . Liters of 02: ra  Post-exercise vitals: . SpO2: 95 . HR: 90 . BP: 130/50 . Liters of O2: ra . CBG: na  Stephanie Cortez, Medical Director Stephanie Cortez is immediately available during today's Pulmonary Rehab session for Stephanie Cortez on 05/10/15 at 1:30pm class time.

## 2015-05-12 ENCOUNTER — Encounter (HOSPITAL_COMMUNITY)
Admission: RE | Admit: 2015-05-12 | Discharge: 2015-05-12 | Disposition: A | Discharge status: Home / Self Care | Payer: Medicare Other | Source: Ambulatory Visit | Attending: Internal Medicine | Admitting: Internal Medicine

## 2015-05-12 ENCOUNTER — Encounter (HOSPITAL_COMMUNITY): Payer: Medicare Other

## 2015-05-12 DIAGNOSIS — J849 Interstitial pulmonary disease, unspecified: Secondary | ICD-10-CM | POA: Diagnosis not present

## 2015-05-12 NOTE — Progress Notes (Signed)
Today, Sharra exercised at [facility]. Service time was from 1330 to 1455.  The patient exercised by performing aerobic, strengthening, and stretching exercises. Oxygen saturation, heart rate, blood pressure, rate of perceived exertion, and shortness of breath were all monitored before, during, and after exercise. Stephanie Cortez presented with no problems at today's exercise session.She watched COPD video in class today.  The patient did  have an increase in workload intensity during today's exercise session.  Pre-exercise vitals: . Weight kg: 87.7 . Liters of O2: RA . SpO2: 96 . HR: 96 . BP: 118/64 . CBG: NA  Exercise vitals: . Highest heartrate:  120 . Lowest oxygen saturation: 92 . Highest blood pressure: 154/80 . Liters of 02: RA  Post-exercise vitals: . SpO2: 94 . HR: 80 . BP: 114/70 . Liters of O2: RA . CBG: NA Dr. Brand Males, Medical Director Dr. Marily Memos is immediately available during today's Pulmonary Rehab session for Jolene Provost on 05/12/2015  at 1330 class time

## 2015-05-17 ENCOUNTER — Other Ambulatory Visit: Payer: Self-pay | Admitting: Internal Medicine

## 2015-05-17 ENCOUNTER — Ambulatory Visit (INDEPENDENT_AMBULATORY_CARE_PROVIDER_SITE_OTHER): Payer: Medicare Other | Admitting: *Deleted

## 2015-05-17 ENCOUNTER — Encounter (HOSPITAL_COMMUNITY): Payer: Medicare Other

## 2015-05-17 DIAGNOSIS — Z5181 Encounter for therapeutic drug level monitoring: Secondary | ICD-10-CM | POA: Diagnosis not present

## 2015-05-17 DIAGNOSIS — I4891 Unspecified atrial fibrillation: Secondary | ICD-10-CM | POA: Diagnosis not present

## 2015-05-17 LAB — POCT INR: INR: 2

## 2015-05-19 ENCOUNTER — Encounter (HOSPITAL_COMMUNITY)
Admission: RE | Admit: 2015-05-19 | Discharge: 2015-05-19 | Disposition: A | Discharge status: Home / Self Care | Payer: Medicare Other | Source: Ambulatory Visit | Attending: Internal Medicine | Admitting: Internal Medicine

## 2015-05-19 ENCOUNTER — Encounter (HOSPITAL_COMMUNITY): Payer: Medicare Other

## 2015-05-19 DIAGNOSIS — J849 Interstitial pulmonary disease, unspecified: Secondary | ICD-10-CM | POA: Diagnosis not present

## 2015-05-19 NOTE — Progress Notes (Signed)
Today, Stephanie Cortez exercised at [facility]. Service time was from 1:30pm to 3:45pm.  The patient exercised by performing aerobic, strengthening, and stretching exercises. Oxygen saturation, heart rate, blood pressure, rate of perceived exertion, and shortness of breath were all monitored before, during, and after exercise. Stephanie Cortez presented with no problems at today's exercise session. The patient attended education today with Stephanie Cortez on Exercise for the Pulmonary Patient.   The patient did not have an increase in workload intensity during today's exercise session.  Pre-exercise vitals: . Weight kg: 88.8 . Liters of O2: ra . SpO2: 98 . HR: 73 . BP: 122/64 . CBG: na  Exercise vitals: . Highest heartrate:  97 . Lowest oxygen saturation: 95 . Highest blood pressure: 144/68 . Liters of 02: ra  Post-exercise vitals: . SpO2: 97 . HR: 83 . BP: 124/70 . Liters of O2: ra . CBG: na  Dr. Brand Males, Medical Director Dr. Cruzita Lederer is immediately available during today's Pulmonary Rehab session for Stephanie Cortez on 05/19/15 at 1:30pm class time.

## 2015-05-24 ENCOUNTER — Encounter (HOSPITAL_COMMUNITY): Payer: Medicare Other

## 2015-06-02 ENCOUNTER — Encounter (HOSPITAL_COMMUNITY)
Admission: RE | Admit: 2015-06-02 | Discharge: 2015-06-02 | Disposition: A | Discharge status: Home / Self Care | Payer: Medicare Other | Source: Ambulatory Visit | Attending: Internal Medicine | Admitting: Internal Medicine

## 2015-06-02 DIAGNOSIS — J849 Interstitial pulmonary disease, unspecified: Secondary | ICD-10-CM | POA: Diagnosis not present

## 2015-06-02 NOTE — Progress Notes (Deleted)
Today, Miara exercised at [employer]. Service time was from 1330 to 1530.  The patient exercised by performing aerobic, strengthening, and stretching exercises. Oxygen saturation, heart rate, blood pressure, rate of perceived exertion, and shortness of breath were all monitored before, during, and after exercise. Jillisa presented with no problems at today's exercise session. Nikia also attended an education session on warning signs and symptoms.  The patient did not have an increase in workload intensity during today's exercise session.  Pre-exercise vitals: . Weight kg: 93.5 . Liters of O2: 3L . SpO2: 96 . HR: 73 . BP: 152/80 . CBG: na  Exercise vitals: . Highest heartrate:  86 . Lowest oxygen saturation: 95 . Highest blood pressure: 140/70 . Liters of 02: 2L  Post-exercise vitals: . SpO2: 98 . HR: 80 . BP: 132/74 . Liters of O2: 2L . CBG: na  Dr. Rush Farmer, Medical Director Dr. Eliseo Squires is immediately available during today's Pulmonary Rehab session for Jolene Provost on 06/02/2015 at 1330 class time

## 2015-06-02 NOTE — Progress Notes (Signed)
Today, Stephanie Cortez exercised at [facility]. Service time was from 1330 to 1530.  The patient exercised by performing aerobic, strengthening, and stretching exercises. Oxygen saturation, heart rate, blood pressure, rate of perceived exertion, and shortness of breath were all monitored before, during, and after exercise. Stephanie Cortez presented with no problems at today's exercise session. Stephanie Cortez also attended an education session on warning signs and symptoms.  The patient did not have an increase in workload intensity during today's exercise session.  Pre-exercise vitals: . Weight kg: 91.7 . Liters of O2: ra . SpO2: 96 . HR: 81 . BP: 130/76 . CBG: na  Exercise vitals: . Highest heartrate:  104 . Lowest oxygen saturation: 95 . Highest blood pressure: 130/82 . Liters of 02: ra  Post-exercise vitals: . SpO2: 97 . HR: 91 . BP: 128/76 . Liters of O2: ra . CBG: na  Dr. Rush Farmer, Medical Director Dr. Eliseo Squires is immediately available during today's Pulmonary Rehab session for Stephanie Cortez on 06/02/2015 at 1330 class time

## 2015-06-07 ENCOUNTER — Telehealth: Payer: Self-pay | Admitting: Internal Medicine

## 2015-06-07 DIAGNOSIS — F411 Generalized anxiety disorder: Secondary | ICD-10-CM | POA: Diagnosis not present

## 2015-06-07 DIAGNOSIS — R8290 Unspecified abnormal findings in urine: Secondary | ICD-10-CM | POA: Diagnosis not present

## 2015-06-07 DIAGNOSIS — I10 Essential (primary) hypertension: Secondary | ICD-10-CM | POA: Diagnosis not present

## 2015-06-07 DIAGNOSIS — H6692 Otitis media, unspecified, left ear: Secondary | ICD-10-CM | POA: Diagnosis not present

## 2015-06-07 DIAGNOSIS — S00412A Abrasion of left ear, initial encounter: Secondary | ICD-10-CM | POA: Diagnosis not present

## 2015-06-07 MED ORDER — MESALAMINE 1.2 G PO TBEC: 2.4000 g | DELAYED_RELEASE_TABLET | Freq: Every day | ORAL | Status: DC

## 2015-06-07 NOTE — Telephone Encounter (Signed)
Returned a call to the patinet & she informed me that she will start her z-pak tonight for 5 days for URI.  Informed the patient that z-pak can interfere with coumadin, therefore we will need to see her this week.  Advised that since she missed last night's dose that we would like to keep her on the same dose of Coumadin since she is starting Z-pak & it can increase her INR.  She verbalized understanding & appt set.  Also, she informed me that she had some bleeding from her ear after using a q-tip in her ear.  She went to see Dr. [name] who examined her ear & gave her instructions about her ear.

## 2015-06-07 NOTE — Telephone Encounter (Signed)
Samples placed at front desk. Patient advised.

## 2015-06-07 NOTE — Telephone Encounter (Signed)
New message      Pt is on warfarin.  She has a URI and PCP prescribed a z-pak. And, pt did not take warfarin pill last night because her ear was bleeding.  PCP told her that she scratched her ear.  Since she missed a pill, how should she restart her warfarin for today?

## 2015-06-08 DIAGNOSIS — H6123 Impacted cerumen, bilateral: Secondary | ICD-10-CM | POA: Diagnosis not present

## 2015-06-09 ENCOUNTER — Ambulatory Visit (INDEPENDENT_AMBULATORY_CARE_PROVIDER_SITE_OTHER): Payer: Medicare Other | Admitting: Pharmacist

## 2015-06-09 DIAGNOSIS — I4891 Unspecified atrial fibrillation: Secondary | ICD-10-CM

## 2015-06-09 DIAGNOSIS — Z5181 Encounter for therapeutic drug level monitoring: Secondary | ICD-10-CM

## 2015-06-09 LAB — POCT INR: INR: 1.8

## 2015-06-21 ENCOUNTER — Telehealth: Payer: Self-pay | Admitting: Internal Medicine

## 2015-06-21 NOTE — Telephone Encounter (Signed)
Called & spoke with patient, she stated she was started on a Z-Pak 250mg  on 06/20/15, she took 2 tablets that day then will continue with 1 tablet daily for 4 days. Advised that the Z-Pak can interfere with Coumadin and she verbalized understanding & stated she was aware of this.  Advised to report to the ER with any bleeding and she verbalized understanding.  Appointment set & confirmed with patient.

## 2015-06-21 NOTE — Telephone Encounter (Signed)
New message      Pt is on coumadin and her doctor started her on a z-pak.  Pt has already had 2 tablets.  Will this interfere with her coumadin?

## 2015-06-22 ENCOUNTER — Ambulatory Visit (INDEPENDENT_AMBULATORY_CARE_PROVIDER_SITE_OTHER): Payer: Medicare Other | Admitting: *Deleted

## 2015-06-22 DIAGNOSIS — I4891 Unspecified atrial fibrillation: Secondary | ICD-10-CM | POA: Diagnosis not present

## 2015-06-22 DIAGNOSIS — Z5181 Encounter for therapeutic drug level monitoring: Secondary | ICD-10-CM

## 2015-06-22 LAB — POCT INR: INR: 2.1

## 2015-06-28 ENCOUNTER — Telehealth (HOSPITAL_COMMUNITY): Payer: Self-pay | Admitting: *Deleted

## 2015-07-05 DIAGNOSIS — J310 Chronic rhinitis: Secondary | ICD-10-CM | POA: Diagnosis not present

## 2015-07-05 NOTE — Addendum Note (Signed)
Encounter addended by: Lance Morin, RN on: 07/05/2015 10:10 AM<BR>     Documentation filed: Notes Section

## 2015-07-05 NOTE — Progress Notes (Signed)
Pulmonary Rehab Discharge Note Stephanie Cortez has been discharged from pulmonary rehab.  She attended 16 exercise sessions and experienced multiple absences due to ongoing sinus infections.  She started the program February 24, 2015 and we suggested that she drop out until her sinus infections improve.  She is not getting the full effect of the program due to her frequent absences.  She did not meet the 90% attendance of the program.  She was instructed to ask Dr. Chase Caller to re-enroll in the program when she is well.  She did not meet her program goals which were to improve her mental and physical outlook on her health and to avoid over-thinking her ILD.

## 2015-07-06 ENCOUNTER — Telehealth: Payer: Self-pay | Admitting: Pharmacist

## 2015-07-06 NOTE — Telephone Encounter (Signed)
Patient called because starting Zpak for 5 day course for respiratory infection. She states she has been on this antibiotic several times before and it has not influenced her INR. I advised patient that usually Zpaks do not effect the INR greatly and to continue taking coumadin as prescribed and keep appointment on 1/25. Patient stated she understood.

## 2015-07-12 ENCOUNTER — Other Ambulatory Visit: Payer: Self-pay | Admitting: Internal Medicine

## 2015-07-18 ENCOUNTER — Encounter: Payer: Self-pay | Admitting: Internal Medicine

## 2015-07-25 DIAGNOSIS — J018 Other acute sinusitis: Secondary | ICD-10-CM | POA: Diagnosis not present

## 2015-07-27 ENCOUNTER — Ambulatory Visit (INDEPENDENT_AMBULATORY_CARE_PROVIDER_SITE_OTHER): Payer: Medicare Other | Admitting: *Deleted

## 2015-07-27 DIAGNOSIS — Z5181 Encounter for therapeutic drug level monitoring: Secondary | ICD-10-CM

## 2015-07-27 DIAGNOSIS — I4891 Unspecified atrial fibrillation: Secondary | ICD-10-CM | POA: Diagnosis not present

## 2015-07-27 LAB — POCT INR: INR: 1.6

## 2015-08-01 ENCOUNTER — Ambulatory Visit (INDEPENDENT_AMBULATORY_CARE_PROVIDER_SITE_OTHER): Payer: Medicare Other | Admitting: Pharmacist

## 2015-08-01 DIAGNOSIS — Z5181 Encounter for therapeutic drug level monitoring: Secondary | ICD-10-CM

## 2015-08-01 DIAGNOSIS — I4891 Unspecified atrial fibrillation: Secondary | ICD-10-CM | POA: Diagnosis not present

## 2015-08-01 LAB — POCT INR: INR: 2.6

## 2015-08-02 ENCOUNTER — Ambulatory Visit (INDEPENDENT_AMBULATORY_CARE_PROVIDER_SITE_OTHER): Payer: Medicare Other | Admitting: Internal Medicine

## 2015-08-02 ENCOUNTER — Encounter: Payer: Self-pay | Admitting: Internal Medicine

## 2015-08-02 VITALS — BP 130/68 | HR 76 | Ht 65.5 in | Wt 194.1 lb

## 2015-08-02 DIAGNOSIS — K589 Irritable bowel syndrome without diarrhea: Secondary | ICD-10-CM | POA: Diagnosis not present

## 2015-08-02 DIAGNOSIS — K5010 Crohn's disease of large intestine without complications: Secondary | ICD-10-CM

## 2015-08-02 DIAGNOSIS — K5732 Diverticulitis of large intestine without perforation or abscess without bleeding: Secondary | ICD-10-CM | POA: Diagnosis not present

## 2015-08-02 MED ORDER — MESALAMINE 1.2 G PO TBEC: 2.4000 g | DELAYED_RELEASE_TABLET | Freq: Every day | ORAL | Status: DC

## 2015-08-02 NOTE — Patient Instructions (Addendum)
We have given you a prescription for the following medications : Lialda 2.4 grams daily (1-800-[id number]))  Follow up with Dr Hilarie Fredrickson in 6 months.

## 2015-08-02 NOTE — Progress Notes (Signed)
   Subjective:    Patient ID: Stephanie Cortez, female    DOB: [birth year], 82 y.o.   MRN: [id number]  HPI Stephanie Cortez is an 82 year old female with a history of segmental colitis associated with diverticulosis of the left colon, IBS, cricopharyngeal achalasia treated with Botox and managed by ENT, A. fib on warfarin, ILD and hypertension who is here for follow-up. She was last seen in August 2016. She reports that she has been doing well recently. She continues on Lialda 2.4 g daily which she feels significant helps her left-sided abdominal discomfort. She's having regular bowel movements occurring 1-2 times per day. No further left-sided abdominal pain. Her weight has been stable. She has not seen blood in her stool or melena. She reports appetite has been good. No nausea or vomiting. She is having issues with lower back pain related to lumbar disc disease which is been somewhat long-standing for her. She has stopped Levsin because the pharmacy recall that for subpotent/suprapotent issues. With stopping it she noted no change in symptoms and doesn't feel the medicine ever really benefited her. She has recently been started on oxygen at night by her pulmonologist  Last colonoscopy 11/14/2011 left-sided diverticulosis without polyps  Review of Systems As per history of present illness, otherwise negative  Current Medications, Allergies, Past Medical History, Past Surgical History, Family History and Social History were reviewed in [employer] record.     Objective:   Physical Exam BP 130/68 mmHg  Pulse 76  Ht 5' 5.5" (1.664 m)  Wt 194 lb 2 oz (88.055 kg)  BMI 31.80 kg/m2 Constitutional: Well-developed and well-nourished. No distress. HEENT: Normocephalic and atraumatic.Conjunctivae are normal.  No scleral icterus. Neck: Neck supple. Trachea midline. Cardiovascular: Normal rate, regular rhythm and intact distal pulses.  Abdominal: Soft, nontender, nondistended. Bowel  sounds active throughout.  Extremities: no clubbing, cyanosis, or edema Neurological: Alert and oriented to person place and time. Skin: Skin is warm and dry. Psychiatric: Normal mood and affect. Behavior is normal.     Assessment & Plan:  82 year old female with a history of segmental colitis associated with diverticulosis of the left colon, IBS, cricopharyngeal achalasia treated with Botox and managed by ENT, A. fib on warfarin, ILD and hypertension who is here for follow-up.  1. Segmental colitis with diverticulosis and IBS -- doing well from GI perspective on Lialda. Lialda is expensive for her. I would like her to continue 2.4 g daily given the beneficial effects of this medication for her. It may be cheaper for her to obtain this from [place]. Continue Benefiber 1 tablespoon daily. Discontinue Levsin.  Follow-up in 6 months, sooner if necessary

## 2015-08-08 ENCOUNTER — Other Ambulatory Visit: Payer: Self-pay | Admitting: Internal Medicine

## 2015-08-10 ENCOUNTER — Encounter: Payer: Self-pay | Admitting: Acute Care

## 2015-08-10 ENCOUNTER — Ambulatory Visit (INDEPENDENT_AMBULATORY_CARE_PROVIDER_SITE_OTHER): Payer: Medicare Other | Admitting: Acute Care

## 2015-08-10 VITALS — BP 104/70 | HR 77 | Ht 65.5 in | Wt 192.4 lb

## 2015-08-10 DIAGNOSIS — J310 Chronic rhinitis: Secondary | ICD-10-CM | POA: Diagnosis not present

## 2015-08-10 DIAGNOSIS — J849 Interstitial pulmonary disease, unspecified: Secondary | ICD-10-CM | POA: Diagnosis not present

## 2015-08-10 DIAGNOSIS — J479 Bronchiectasis, uncomplicated: Secondary | ICD-10-CM

## 2015-08-10 MED ORDER — GUAIFENESIN ER 600 MG PO TB12: 600.0000 mg | ORAL_TABLET | Freq: Two times a day (BID) | ORAL | Status: DC

## 2015-08-10 MED ORDER — BUDESONIDE-FORMOTEROL FUMARATE 80-4.5 MCG/ACT IN AERO: INHALATION_SPRAY | RESPIRATORY_TRACT | Status: DC

## 2015-08-10 NOTE — Assessment & Plan Note (Signed)
Chronic Rhinosinusitis with congestion  Plan: Continue saline rinses and saline mist Add Zyrtec once daily You can try Ayr saline gel for nasal dryness

## 2015-08-10 NOTE — Assessment & Plan Note (Signed)
Compliant with nocturnal oxygen Nasal Dryness, irritation due to oxygen  Plan: [facility] to evaluate nocturnal oxygen for humidification for nasal dryness. Continue using saline mist or AYR Gel to help with nasal dryness

## 2015-08-10 NOTE — Patient Instructions (Addendum)
It is nice to meet you today.  I am sorry you are not feeling well. Continue your clarithromycin until competed. Let's try Mucinex twice daily with water Continue saline rinses as you have been doing. Saline mist as you have been doing Add Zyrtec once a day. We will give you a prescription for Symbicort 80  today. Try AYR saline gel for nasal dryness. Continue your oxygen at night. [facility] to evaluate nocturnal oxygen for humidification for nasal dryness. Continue protonix daily Follow up with Dr. Chase Caller 09/26/15 for 6 minute walk/ PFT's and office visit. Please contact office for sooner follow up if symptoms do not improve or worsen or seek emergency care

## 2015-08-10 NOTE — Assessment & Plan Note (Addendum)
Seen by PCP for what appears to be a flare. Treated initially with Z-pack, and then started on a 10 day course of Clarithromycin 500 mg BID on 07/25/15. No fever Cough with some yellow mucus No wheezing upon auscultation  Continue your clarithromycin until competed. Patient did not want to try a prednisone taper due to the way it affects her appetite. As I did not auscultate wheezing upon exam I did not prescribe a prednisone taper. Let's try Mucinex twice daily with water Continue saline rinses as you have been doing. Saline mist as you have been doing Add Zyrtec once a day. We will give you a prescription for Symbicort 80  today. Try AYR saline gel for nasal dryness. Continue Protonix daily  Follow up with Dr. Chase Caller 09/26/15 for 6 minute walk/ PFT's and office visit. Please contact office for sooner follow up if symptoms do not improve or worsen or seek emergency care

## 2015-08-10 NOTE — Progress Notes (Signed)
Subjective:    Patient ID: Stephanie Cortez, female    DOB: 1933-08-07, 81 y.o.   MRN: [id number]  HPI  Stephanie Cortez is an 82 year old female with a history of A. fib on warfarin, ILD and hypertension.   Significant events/procedures:  08/2014:  EXAM: CHEST 2 VIEW  COMPARISON: 11/24/2013  FINDINGS: Cardiac shadow is enlarged. The lungs are hyperinflated consistent with COPD. Mild interstitial changes are identified as well as changes of prior granulomatous disease. No acute bony abnormality is seen. No focal infiltrate is noted.  IMPRESSION: COPD and prior granulomatous disease. No acute abnormality noted.   08/11/2015: Acute office visit:  Patient presents to the office today with complaint of chest congestion and cough with yellow mucus intermittent wheezing and shortness of breath that has been ongoing for about 2-3 weeks, but per patient this has worsened over the last 2-3 days. Patient denies fever, chills, chest pain or hemoptysis. Patient endorses scant bloody mucus from her nose, that she feels this is probably related to her nocturnal oxygen use. She presented to her primary care physician [name] at [facility] and was treated initially with a Z-Pak. When Z-Pak did not resolve patient's symptoms she was then treated with clarithromycin 500 mg twice daily starting 07/25/2015 for a 10 day treatment period . She is currently still taking this antibiotic. States since starting the clarithromycin that she has noticed some improvement.    Current outpatient prescriptions:  .  amLODipine (NORVASC) 2.5 MG tablet, Take 2.5 mg by mouth daily., Disp: , Rfl:  .  BENICAR 40 MG tablet, TAKE 1 TABLET BY MOUTH DAILY, Disp: 90 tablet, Rfl: 1 .  budesonide-formoterol (SYMBICORT) 80-4.5 MCG/ACT inhaler, INHALE 2 PUFFS INTO THE LUNGS TWICE DAILY, Disp: 10.2 g, Rfl: 6 .  cetirizine (ZYRTEC) 10 MG tablet, Take 10 mg by mouth daily as needed. , Disp: , Rfl:  .   Cholecalciferol (VITAMIN D PO), Take 1 tablet by mouth daily., Disp: , Rfl:  .  Cyanocobalamin (VITAMIN B-12 PO), Take 1 tablet by mouth daily., Disp: , Rfl:  .  FLUoxetine (PROZAC) 10 MG capsule, TAKE ONE CAPSULE BY MOUTH EVERY DAY, Disp: 90 capsule, Rfl: 0 .  furosemide (LASIX) 20 MG tablet, Take 1 tablet (20 mg total) by mouth daily as needed., Disp: 30 tablet, Rfl: 1 .  levothyroxine (SYNTHROID, LEVOTHROID) 50 MCG tablet, Take 1 tablet (50 mcg total) by mouth daily., Disp: 90 tablet, Rfl: 3 .  MAGNESIUM PO, Take 1 tablet by mouth daily. , Disp: , Rfl:  .  mesalamine (LIALDA) 1.2 g EC tablet, Take 2 tablets (2.4 g total) by mouth daily., Disp: 60 tablet, Rfl: 5 .  nebivolol (BYSTOLIC) 5 MG tablet, Take 1 & 1/2 tablets (7.5 mg) by mouth once daily, Disp: 45 tablet, Rfl: 6 .  omeprazole (PRILOSEC) 20 MG capsule, TAKE 1 CAPSULE(20 MG) BY MOUTH DAILY, Disp: 30 capsule, Rfl: 4 .  OXYGEN, Inhale into the lungs. 2 Liters only at night, Disp: , Rfl:  .  Potassium (POTASSIMIN) 75 MG TABS, Take 1 tablet by mouth daily. 1 tab po qd, Disp: , Rfl:  .  Respiratory Therapy Supplies (FLUTTER) DEVI, Use as directed, Disp: 1 each, Rfl: 0 .  selenium 50 MCG TABS, Take 50 mcg by mouth daily., Disp: , Rfl:  .  sodium chloride (OCEAN) 0.65 % SOLN nasal spray, Place 1 spray into both nostrils as needed for congestion., Disp: , Rfl:  .  warfarin (COUMADIN) 2.5 MG tablet,  Take as directed by Coumadin clinic, Disp: 35 tablet, Rfl: 3 .  Zinc 22.5 MG TABS, Take by mouth daily., Disp: , Rfl:  .  guaiFENesin (MUCINEX) 600 MG 12 hr tablet, Take 1 tablet (600 mg total) by mouth 2 (two) times daily., Disp: 60 tablet, Rfl: 1   Past Medical History  Diagnosis Date  . Bronchiectasis     >PFT 07/13/2008 in [place] 1.9L/76%, FVC 2.45L/74, Ratio 79, TLC 121%, DLCO 64%  AE BRonchiectasis - Dec 2010.New Rx:  outpatient - Feb 2011 - Rx outpatient  . Dyslipidemia   . Eczema   . Anemia     - Hgb 9.7gm% on 07/13/2008 in [place] -   Hgg 129gm% wiht normal irone levsl and ferritin 10/27/2008 in [place] Recurrent otitis/sinusitis  . Atrial fibrillation (HCC)     chronic anticoag  . Glaucoma   . Back pain 2009  . Anxiety     chronic BZ prn  . Diverticulosis   . COPD (chronic obstructive pulmonary disease) (HCC)     bronchiectasis  . Hypertension   . Arthritis   . IBS (irritable bowel syndrome)   . H. pylori infection   . GERD with stricture   . Hyponatremia     chronic, s/p endo eval 06/2012  . Hypothyroid   . DDD (degenerative disc disease), lumbar     MRI 08/2012 - working with [facility]  . CHF (congestive heart failure) ([place])   . Cricopharyngeal achalasia   . Segmental colitis ([place])     Allergies  Allergen Reactions  . Doxycycline     Rash on face and neck  . Flagyl [Metronidazole] Nausea And Vomiting  . Moxifloxacin Other (See Comments)    REACTION: weakness  . Pantoprazole Sodium Other (See Comments)    REACTION: rash  . Penicillins Other (See Comments)    REACTION: rash     Review of Systems Constitutional:   No  weight loss, night sweats,  Fevers, chills, fatigue, or  lassitude.  HEENT:   No headaches,  Difficulty swallowing,  Tooth/dental problems, or  Sore throat,                No sneezing, itching, ear ache, nasal congestion, +post nasal drip,   CV:  No chest pain,  Orthopnea, PND, +swelling in lower extremities since starting amlodipine, no anasarca, dizziness, palpitations, syncope.   GI  No heartburn, indigestion, abdominal pain, nausea, vomiting, diarrhea, change in bowel habits, loss of appetite, bloody stools.   Resp: + shortness of breath with exertion not at rest.  + excess mucus, + productive cough,  No non-productive cough,  No coughing up of blood.  + change in color of mucus.  No wheezing.  No chest wall deformity  Skin: no rash or lesions.  GU: no dysuria, change in color of urine, no urgency or frequency.  No flank pain, no hematuria   MS:  No joint pain or swelling.  No  decreased range of motion.  Chronic low back pain.  Psych:  No change in mood or affect. No depression or anxiety.  No memory loss.        Objective:   Physical Exam  BP 104/70 mmHg  Pulse 77  Ht 5' 5.5" (1.664 m)  Wt 192 lb 6.4 oz (87.272 kg)  BMI 31.52 kg/m2  SpO2 99%  Physical Exam:  General- No distress,  A&Ox3, nontoxic appearing, pleasant ENT: No sinus tenderness, TM clear, pale nasal mucosa, no oral exudate,no post nasal  drip, no LAN Cardiac: S1, S2, regular rate and rhythm, no murmur Chest: No wheeze/ +crackles per bases/ no dullness; no accessory muscle use, no nasal flaring, no sternal retractions Abd.: Soft Non-tender Ext: No clubbing cyanosis, 2+ lower extremity edema Neuro:  normal strength Skin: No rashes, warm and dry Psych: normal mood and behavior     Assessment & Plan:

## 2015-08-11 ENCOUNTER — Telehealth: Payer: Self-pay | Admitting: Internal Medicine

## 2015-08-11 ENCOUNTER — Telehealth: Payer: Self-pay | Admitting: Acute Care

## 2015-08-11 NOTE — Telephone Encounter (Signed)
I explained to her yesterday that the oxygen attached to her tank will humidify the oxygen coming out of the tank, which is what is drying her nose. The in home humidifier will not be as effective, but it is her choice certainly

## 2015-08-11 NOTE — Telephone Encounter (Signed)
Spoke with pt. States that when she was here yesterday to see Judson Roch, she recommended ordering a humidifier for pt's oxygen to help with sinus dryness. Pt has been thinking about this and would like to buy a humidifier for her home instead. She is would rather to be able to move to the humidifier from room to room instead of having it attached to her concentrator. Pt would like Sarah's thoughts on this.  Judson Roch - please advise. Thanks.

## 2015-08-11 NOTE — Telephone Encounter (Signed)
Error.Stanley A Dalton ° °

## 2015-08-11 NOTE — Telephone Encounter (Signed)
Spoke with pt. She is aware of Sarah's recommendation. States, "All of this makes sense now." Pt will proceed with order from [facility] for the humidifier. Nothing further was needed at this time.

## 2015-08-15 ENCOUNTER — Telehealth: Payer: Self-pay | Admitting: Internal Medicine

## 2015-08-15 MED ORDER — PREDNISONE 10 MG PO TABS: ORAL_TABLET | ORAL | Status: DC

## 2015-08-15 NOTE — Telephone Encounter (Signed)
Agree with pred request  Take prednisone 40 mg daily x 2 days, then 20mg  daily x 2 days, then 10mg  daily x 2 days, then 5mg  daily x 2 days and stop

## 2015-08-15 NOTE — Telephone Encounter (Signed)
Called spoke with pt. Aware of recs below. RX sent in. Nothing further needed 

## 2015-08-15 NOTE — Telephone Encounter (Signed)
Per 08/10/15 OV with Sarah: Patient Instructions       It is nice to meet you today.  I am sorry you are not feeling well. Continue your clarithromycin until competed. Let's try Mucinex twice daily with water Continue saline rinses as you have been doing. Saline mist as you have been doing Add Zyrtec once a day. We will give you a prescription for Symbicort 80  today. Try AYR saline gel for nasal dryness. Continue your oxygen at night. [facility] to evaluate nocturnal oxygen for humidification for nasal dryness. Continue protonix daily Follow up with Dr. Chase Caller 09/26/15 for 6 minute walk/ PFT's and office visit. Please contact office for sooner follow up if symptoms do not improve or worsen or seek emergency care   ---  Called spoke with pt. She is still coughing up clear-light yellow phlem, Loud audible wheezing per pt, chest tx. She reports she is better but not completely 100%. She did finish the ABX. She is taking mucinex still daily. Wants prednisone called in. Please advise MR thanks

## 2015-08-22 ENCOUNTER — Ambulatory Visit (INDEPENDENT_AMBULATORY_CARE_PROVIDER_SITE_OTHER): Payer: Medicare Other

## 2015-08-22 DIAGNOSIS — Z5181 Encounter for therapeutic drug level monitoring: Secondary | ICD-10-CM

## 2015-08-22 DIAGNOSIS — I4891 Unspecified atrial fibrillation: Secondary | ICD-10-CM | POA: Diagnosis not present

## 2015-08-22 LAB — POCT INR: INR: 3.1

## 2015-08-25 ENCOUNTER — Telehealth: Payer: Self-pay | Admitting: Pharmacist

## 2015-08-25 NOTE — Telephone Encounter (Signed)
Pt is having a root canal on 09/06/15 by Dr. Zollie Beckers - asking for pt to hold Coumadin x48 hours prior to procedure. Pt on Coumadin for afib with CHADS2 score of 3 (HF, HTN, age). Pt ok to hold for 48 hours and restart in the evening on 3/7. Pt has been given instructions, clearance faxed to Dr. Sue Lush [phone].

## 2015-08-26 ENCOUNTER — Encounter: Payer: Self-pay | Admitting: Internal Medicine

## 2015-09-08 ENCOUNTER — Other Ambulatory Visit: Payer: Self-pay | Admitting: Internal Medicine

## 2015-09-08 ENCOUNTER — Other Ambulatory Visit: Payer: Self-pay | Admitting: *Deleted

## 2015-09-08 MED ORDER — FUROSEMIDE 20 MG PO TABS: 20.0000 mg | ORAL_TABLET | Freq: Every day | ORAL | Status: DC | PRN

## 2015-09-13 ENCOUNTER — Ambulatory Visit (INDEPENDENT_AMBULATORY_CARE_PROVIDER_SITE_OTHER): Payer: Medicare Other

## 2015-09-13 DIAGNOSIS — I4891 Unspecified atrial fibrillation: Secondary | ICD-10-CM | POA: Diagnosis not present

## 2015-09-13 DIAGNOSIS — Z5181 Encounter for therapeutic drug level monitoring: Secondary | ICD-10-CM | POA: Diagnosis not present

## 2015-09-13 LAB — POCT INR: INR: 1.3

## 2015-09-23 ENCOUNTER — Ambulatory Visit (INDEPENDENT_AMBULATORY_CARE_PROVIDER_SITE_OTHER): Payer: Medicare Other | Admitting: *Deleted

## 2015-09-23 ENCOUNTER — Other Ambulatory Visit: Payer: Self-pay | Admitting: Internal Medicine

## 2015-09-23 DIAGNOSIS — R0600 Dyspnea, unspecified: Secondary | ICD-10-CM

## 2015-09-23 DIAGNOSIS — Z5181 Encounter for therapeutic drug level monitoring: Secondary | ICD-10-CM | POA: Diagnosis not present

## 2015-09-23 DIAGNOSIS — I4891 Unspecified atrial fibrillation: Secondary | ICD-10-CM | POA: Diagnosis not present

## 2015-09-23 LAB — POCT INR: INR: 1.8

## 2015-09-26 ENCOUNTER — Ambulatory Visit (INDEPENDENT_AMBULATORY_CARE_PROVIDER_SITE_OTHER): Payer: Medicare Other | Admitting: Internal Medicine

## 2015-09-26 ENCOUNTER — Encounter: Payer: Self-pay | Admitting: Internal Medicine

## 2015-09-26 VITALS — BP 132/80 | HR 99 | Temp 98.3°F | Ht 66.0 in | Wt 192.0 lb

## 2015-09-26 DIAGNOSIS — R0609 Other forms of dyspnea: Secondary | ICD-10-CM

## 2015-09-26 DIAGNOSIS — J849 Interstitial pulmonary disease, unspecified: Secondary | ICD-10-CM | POA: Diagnosis not present

## 2015-09-26 DIAGNOSIS — J8489 Other specified interstitial pulmonary diseases: Secondary | ICD-10-CM

## 2015-09-26 DIAGNOSIS — J328 Other chronic sinusitis: Secondary | ICD-10-CM | POA: Diagnosis not present

## 2015-09-26 DIAGNOSIS — J329 Chronic sinusitis, unspecified: Secondary | ICD-10-CM | POA: Insufficient documentation

## 2015-09-26 DIAGNOSIS — R0600 Dyspnea, unspecified: Secondary | ICD-10-CM

## 2015-09-26 LAB — PULMONARY FUNCTION TEST
FEF 25-75 Pre: 1.26 L/sec
FEF2575-%Pred-Pre: 82 %
FEV1-%Pred-Pre: 59 %
FEV1-Pre: 1.28 L
FEV1FVC-%Pred-Pre: 111 %
FEV6-%Pred-Pre: 57 %
FEV6-Pre: 1.55 L
FEV6FVC-%Pred-Pre: 105 %
FVC-%Pred-Pre: 54 %
FVC-Pre: 1.55 L
Pre FEV1/FVC ratio: 83 %
Pre FEV6/FVC Ratio: 100 %

## 2015-09-26 MED ORDER — PREDNISONE 10 MG PO TABS: ORAL_TABLET | ORAL | Status: DC

## 2015-09-26 NOTE — Progress Notes (Signed)
Spirometry done today. 

## 2015-09-26 NOTE — Addendum Note (Signed)
Addended by: Collier Salina on: 09/26/2015 11:56 AM   Modules accepted: Orders

## 2015-09-26 NOTE — Progress Notes (Signed)
Signed  Dr. Brand Males, M.D., F.C.C.P Pulmonary and Critical Care Medicine Staff Physician [facility] Pager: [phone], If no answer or between  15:00h - 7:00h: call [phone]  09/26/2015 11:04 AM

## 2015-09-26 NOTE — Progress Notes (Signed)
Subjective:     Patient ID: Stephanie Cortez, female   DOB: 07-16-1933, 82 y.o.   MRN: [id number]  HPI  OV 03/24/2015  Chief Complaint  Patient presents with  . Follow-up    Pt here after PFT in 01/2015. Pt using nocturnal O2 and has noticed a difference the next day in her breathing. Pt states she has a prod cough with discolored mucus, PND, sinus pressure.    [name] is here for follow-up with her daughter Stephanie Cortez, they are presenting for review of ILD workup. I'd always followed up with her baseline diagnosis of bronchiectasis but it started becoming more apparent that she actually has ILD. 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 According to the radiologist it has been stable since 2014 at least. Autoimmune workup showed elevated angiotensin-converting enzyme and CK but otherwise normal. Since the diagnosis of elevated CK her primary care physician has stopped her statin. There is no follow-up CK on this. We also notices that she had nocturnal desaturations restarted her on oxygen and sent her to pulmonary rehabilitation. These 2 measures haven't helped her immensely. She still continues a Symbicort for the prior diagnosis of bronchiectasis. She is inclined to continue that. She will have a flu shot today. She wants to know more about interstitial lung disease and had lot of questions about it  OV  08/11/2015: Acute office visit:  Patient presents to the office today with complaint of chest congestion and cough with yellow mucus intermittent wheezing and shortness of breath that has been ongoing for about 2-3 weeks, but per patient this has worsened over the last 2-3 days. Patient denies fever, chills, chest pain or hemoptysis. Patient endorses scant bloody mucus from her nose, that she feels this is probably related to her nocturnal oxygen use. She presented to her primary care physician [name] at [facility] and was treated initially with a Z-Pak. When  Z-Pak did not resolve patient's symptoms she was then treated with clarithromycin 500 mg twice daily starting 07/25/2015 for a 10 day treatment period . She is currently still taking this antibiotic. States since starting the clarithromycin that she has noticed some improvement.    OV 09/26/2015  Chief Complaint  Patient presents with  . Follow-up    Pt here after seeing SG on 08/10/15 for an acute visit. Pt here after 21mwt and PFT. Pt states she is still not feeling well. Pt c/o headache, hoarseness, wheezing, prod cough. Pt denies increase in SOB and f/c/s.    Routine office visit for this lady with ILD/NSIP pattern (previous diagnosis of bronchiectasis and on Symbicort]. Has associated sinus problems. Her daughter Stephanie Cortez is not with her today.   82 year old female presents for follow-up. She is frustrated by her chronic sinus drainage and chronic sinus tension  in the frontal area. This is been going on for many months a few years. She seen Dr. [name] in ENT and apparently is planning a procedure. In the last several days to a few weeks she's having increasing headache particularly in the last 3 days. However there is no increase in sinus drainage volume or change in color of the sinus drainage [baseline color is much reading between white and yellow and green]  However in the last day or 2 she's increasing cough and wheezing despite taking Symbicort.  She is frustrated by her symptoms. She wants a definite answer currently same time she is reluctant to have surgical lung  biopsy. She is also reluctant to have chronic daily prednisone because of side effects. She is also at the same time frustrated about her extreme level of curiosity and need to search the Internet about her disease condition and then this works her into an anxiety      Spirometry today 09/26/2015 shows FVC 1.55 L/54%, FEV1 1.28 L/59% and a ratio of 83. This comes 6 weeks of her short prednisone burst. This a 300 mL decline in  FVC since August 2016 but an improvement by 170 mL since June 2016   6 minute walk test today: Walk 336 m. Resting pulse ox was 98%. Peak exertion pulse ox was 94%. She had back pain but not shortness of breath.  Last CT scan of the chest was June 2016 which showed NSIP pattern  Last CT scan of the sinuses August 2013 that showed mild mucosal edema in the paranasal sinuses  Review of Systems Per hpi    Objective:   Physical Exam  Constitutional: She is oriented to person, place, and time. She appears well-developed and well-nourished. No distress.  HENT:  Head: Normocephalic and atraumatic.  Right Ear: External ear normal.  Left Ear: External ear normal.  Mouth/Throat: Oropharynx is clear and moist. No oropharyngeal exudate.  Eyes: Conjunctivae and EOM are normal. Pupils are equal, round, and reactive to light. Right eye exhibits no discharge. Left eye exhibits no discharge. No scleral icterus.  Neck: Normal range of motion. Neck supple. No JVD present. No tracheal deviation present. No thyromegaly present.  Cardiovascular: Normal rate, regular rhythm, normal heart sounds and intact distal pulses.  Exam reveals no gallop and no friction rub.   No murmur heard. Pulmonary/Chest: Effort normal and breath sounds normal. No respiratory distress. She has no wheezes. She has no rales. She exhibits no tenderness.  Abdominal: Soft. Bowel sounds are normal. She exhibits no distension and no mass. There is no tenderness. There is no rebound and no guarding.  Musculoskeletal: Normal range of motion. She exhibits no edema or tenderness.  Lymphadenopathy:    She has no cervical adenopathy.  Neurological: She is alert and oriented to person, place, and time. She has normal reflexes. No cranial nerve deficit. She exhibits normal muscle tone. Coordination normal.  Skin: Skin is warm and dry. No rash noted. She is not diaphoretic. No erythema. No pallor.  Psychiatric: She has a normal mood and affect.  Her behavior is normal. Judgment and thought content normal.  Vitals reviewed.   Filed Vitals:   09/26/15 1048  BP: 132/80  Pulse: 99  Temp: 98.3 F (36.8 C)  TempSrc: Oral  Height: 5\' 6"  (1.676 m)  Weight: 192 lb (87.091 kg)  SpO2: 98%        Assessment:       ICD-9-CM ICD-10-CM   1. ILD (interstitial lung disease) ([place]) 515 J84.9   2. NSIP (nonspecific interstitial pneumonia) ([place]) 516.8 J84.89   3. Other chronic sinusitis 473.8 J32.8        Plan:      You have ILD disease - severity is moderate and seems stable There are 100 varieties of this but I think you might have NSIP variety Only way to confirm diagnosis is surgical lung biopsy but I worry about risk Other alternative is try to daily maintenance steroids for many months   - but as discussed will hold off due to side effect but will cosnider this if lung fucntion declines One other alternative is to try a minimally  invasive lung biopsy through a research protocol   - we might have avaialble in summer/fall 2017  For now we will monitor disease with scheduled office visits, serial lung function and CT chest  Currently some sinus flare up  - Take prednisone 40 mg daily x 2 days, then 20mg  daily x 2 days, then 10mg  daily x 2 days, then 5mg  daily x 2 days and stop   Continue Symbicort as before that she might have some coexistent asthma  Followup  - HRCT chest in 3 months whic hiwill be 1 year since prior CT - ROV Dr Chase Caller in 3 months      > 50% of this > 25 min visit spent in face to face counseling or coordination of care    Dr. Brand Males, M.D., [facility].C.P Pulmonary and Critical Care Medicine Staff Physician [facility] Pager: [phone], If no answer or between  15:00h - 7:00h: call [phone]  09/26/2015 11:48 AM

## 2015-09-26 NOTE — Patient Instructions (Addendum)
ICD-9-CM ICD-10-CM   1. ILD (interstitial lung disease) ([place]) 515 J84.9   2. NSIP (nonspecific interstitial pneumonia) ([place]) 516.8 J84.89   3. Other chronic sinusitis 473.8 J32.8     You have ILD disease - severity is moderate and seems stable There are 100 varieties of this but I think you might have NSIP variety Only way to confirm diagnosis is surgical lung biopsy but I worry about risk Other alternative is try to daily maintenance steroids for many months   - but as discussed will hold off due to side effect but will cosnider this if lung fucntion declines One other alternative is to try a minimally invasive lung biopsy through a research protocol   - we might have avaialble in summer/fall 2017 For now we will monitor disease with scheduled office visits, serial lung function and CT chest  Currently some sinus flare up  - Take prednisone 40 mg daily x 2 days, then 20mg  daily x 2 days, then 10mg  daily x 2 days, then 5mg  daily x 2 days and stop     Continue Symbicort as before that she might have some coexistent asthm  Followup  - HRCT chest in 3 months - ROV Dr Chase Caller in 3 months

## 2015-09-29 ENCOUNTER — Ambulatory Visit: Payer: Medicare Other | Admitting: Internal Medicine

## 2015-10-04 ENCOUNTER — Ambulatory Visit: Payer: Medicare Other | Admitting: Internal Medicine

## 2015-10-11 ENCOUNTER — Ambulatory Visit (INDEPENDENT_AMBULATORY_CARE_PROVIDER_SITE_OTHER): Payer: Medicare Other | Admitting: *Deleted

## 2015-10-11 ENCOUNTER — Ambulatory Visit (INDEPENDENT_AMBULATORY_CARE_PROVIDER_SITE_OTHER): Payer: Medicare Other | Admitting: Internal Medicine

## 2015-10-11 ENCOUNTER — Encounter: Payer: Self-pay | Admitting: Internal Medicine

## 2015-10-11 VITALS — BP 140/82 | HR 84 | Ht 66.0 in | Wt 188.0 lb

## 2015-10-11 DIAGNOSIS — Z5181 Encounter for therapeutic drug level monitoring: Secondary | ICD-10-CM

## 2015-10-11 DIAGNOSIS — I5030 Unspecified diastolic (congestive) heart failure: Secondary | ICD-10-CM

## 2015-10-11 DIAGNOSIS — I10 Essential (primary) hypertension: Secondary | ICD-10-CM

## 2015-10-11 DIAGNOSIS — I951 Orthostatic hypotension: Secondary | ICD-10-CM

## 2015-10-11 DIAGNOSIS — I4891 Unspecified atrial fibrillation: Secondary | ICD-10-CM | POA: Diagnosis not present

## 2015-10-11 DIAGNOSIS — I482 Chronic atrial fibrillation, unspecified: Secondary | ICD-10-CM

## 2015-10-11 LAB — POCT INR: INR: 2.3

## 2015-10-11 MED ORDER — DILTIAZEM HCL ER COATED BEADS 120 MG PO CP24: 120.0000 mg | ORAL_CAPSULE | Freq: Every day | ORAL | Status: DC

## 2015-10-11 MED ORDER — NEBIVOLOL HCL 5 MG PO TABS: 5.0000 mg | ORAL_TABLET | Freq: Every day | ORAL | Status: DC

## 2015-10-11 NOTE — Patient Instructions (Signed)
Medication Instructions: 1) Stop norvasc (amlodipine) 2) Decrease bystolic (nebivolol) to 5 mg one tablet by mouth once daily 3) Start cardizem (diltiazem) 120 mg one capsule by mouth once daily  Labwork: - none  Procedures/Testing: - none  Follow-Up: - Your physician wants you to follow-up in: 6 months with Tommye Standard, PA for Dr. Caryl Comes. You will receive a reminder letter in the mail two months in advance. If you don't receive a letter, please call our office to schedule the follow-up appointment.  Any Additional Special Instructions Will Be Listed Below (If Applicable).     If you need a refill on your cardiac medications before your next appointment, please call your pharmacy.

## 2015-10-11 NOTE — Progress Notes (Signed)
Patient Care Team: [name], MD as PCP - General (Family Medicine) Deboraha Sprang, MD (Cardiology) Sable Feil, MD (Gastroenterology) Brand Males, MD (Pulmonary Disease) Renato Shin, MD (Endocrinology) Erline Levine, MD (Neurosurgery) Weber Cooks, MD (Orthopedic Surgery) Rozetta Nunnery, MD (Otolaryngology)   HPI  Stephanie Cortez is a 82 y.o. female Seen in followup for long-standing atrial fibrillation dating back about 10 years. It is felt to be permanent having failed multiple cardioversions and antiarrhythmic drugs.   She was admitted 12/14in  [place]  because of chest pain elevated blood pressure and   atrial fibrillation with a rapid rate. She underwent a catheterization which was nonobstructive apparently. An echocardiogram was also normal except for left atrial enlargement. These records have been reviewed    Overall she is doing pretty well. Dyspnea is stable. There is no significant edema.  He has been diagnosed with ILD and is followed by Dr Chase Caller  Her sister is much improved assisted living.     Past Medical History  Diagnosis Date  . Bronchiectasis     >PFT 07/13/2008 in [place] 1.9L/76%, FVC 2.45L/74, Ratio 79, TLC 121%, DLCO 64%  AE BRonchiectasis - Dec 2010.New Rx:  outpatient - Feb 2011 - Rx outpatient  . Dyslipidemia   . Eczema   . Anemia     - Hgb 9.7gm% on 07/13/2008 in [place] -  Hgg 129gm% wiht normal irone levsl and ferritin 10/27/2008 in [place] Recurrent otitis/sinusitis  . Atrial fibrillation (HCC)     chronic anticoag  . Glaucoma   . Back pain 2009  . Anxiety     chronic BZ prn  . Diverticulosis   . COPD (chronic obstructive pulmonary disease) (HCC)     bronchiectasis  . Hypertension   . Arthritis   . IBS (irritable bowel syndrome)   . H. pylori infection   . GERD with stricture   . Hyponatremia     chronic, s/p endo eval 06/2012  . Hypothyroid   . DDD (degenerative disc disease), lumbar     MRI 08/2012 -  working with [facility]  . CHF (congestive heart failure) ([place])   . Cricopharyngeal achalasia   . Segmental colitis [facility])     Past Surgical History  Procedure Laterality Date  . Cataract extraction      both  . Foot surgery  03/14/2011    gastroc slide-rt  . Total abdominal hysterectomy    . Colonoscopy    . Breast surgery      br bx  . Esophagoscopy w/ botox injection  12/11/2011    Procedure: ESOPHAGOSCOPY WITH BOTOX INJECTION;  Surgeon: Rozetta Nunnery, MD;  Location: [facility];  Service: ENT;  Laterality: N/A;  esophogoscopy with dilation, botox injection  . Cardiac catheterization      Current Outpatient Prescriptions  Medication Sig Dispense Refill  . amLODipine (NORVASC) 2.5 MG tablet Take 2.5 mg by mouth daily.    [name] Kitchen BENICAR 40 MG tablet TAKE 1 TABLET BY MOUTH DAILY 90 tablet 1  . budesonide-formoterol (SYMBICORT) 80-4.5 MCG/ACT inhaler INHALE 2 PUFFS INTO THE LUNGS TWICE DAILY 10.2 g 6  . cetirizine (ZYRTEC) 10 MG tablet Take 10 mg by mouth daily as needed.     . Cholecalciferol (VITAMIN D PO) Take 1 tablet by mouth daily.    . Cyanocobalamin (VITAMIN B-12 PO) Take 1 tablet by mouth daily.    [name] Kitchen FLUoxetine (PROZAC) 10 MG capsule TAKE ONE CAPSULE BY MOUTH  EVERY DAY 90 capsule 0  . furosemide (LASIX) 20 MG tablet Take 1 tablet (20 mg total) by mouth daily as needed. 30 tablet 1  . guaiFENesin (MUCINEX) 600 MG 12 hr tablet Take 1 tablet (600 mg total) by mouth 2 (two) times daily. 60 tablet 1  . levothyroxine (SYNTHROID, LEVOTHROID) 50 MCG tablet Take 1 tablet (50 mcg total) by mouth daily. 90 tablet 3  . MAGNESIUM PO Take 1 tablet by mouth daily.     . mesalamine (LIALDA) 1.2 g EC tablet Take 2 tablets (2.4 g total) by mouth daily. 60 tablet 5  . nebivolol (BYSTOLIC) 5 MG tablet Take 1 & 1/2 tablets (7.5 mg) by mouth once daily 45 tablet 6  . omeprazole (PRILOSEC) 20 MG capsule TAKE 1 CAPSULE(20 MG) BY MOUTH DAILY 30 capsule 4  . OXYGEN Inhale into the lungs. 2  Liters only at night    . Potassium (POTASSIMIN) 75 MG TABS Take 1 tablet by mouth daily. 1 tab po qd    . predniSONE (DELTASONE) 10 MG tablet 40 mg daily x 2 days, then 20mg  daily x 2 days, then 10mg  daily x 2 days, then 5mg  daily x 2 days and stop 15 tablet 0  . Respiratory Therapy Supplies (FLUTTER) DEVI Use as directed 1 each 0  . selenium 50 MCG TABS Take 50 mcg by mouth daily.    . sodium chloride (OCEAN) 0.65 % SOLN nasal spray Place 1 spray into both nostrils as needed for congestion.    [name] Kitchen warfarin (COUMADIN) 2.5 MG tablet Take as directed by Coumadin clinic 35 tablet 3  . Zinc 22.5 MG TABS Take by mouth daily.     No current facility-administered medications for this visit.    Allergies  Allergen Reactions  . Doxycycline     Rash on face and neck  . Flagyl [Metronidazole] Nausea And Vomiting  . Moxifloxacin Other (See Comments)    REACTION: weakness  . Pantoprazole Sodium Other (See Comments)    REACTION: rash  . Penicillins Other (See Comments)    REACTION: rash    Review of Systems negative except from HPI and PMH  Physical Exam BP 140/82 mmHg  Pulse 84  Ht 5\' 6"  (1.676 m)  Wt 188 lb (85.276 kg)  BMI 30.36 kg/m2 Well developed and well nourished in no acute distress HENT normal E scleral and icterus clear Neck Supple JVP flat; carotids brisk and full Clear to ausculation Irregularly irregular rate and rhythm, 2/6 murmurs  Soft with active bowel sounds No clubbing cyanosis none Edema Alert and oriented, grossly normal motor and sensory function Skin Warm and Dry  ECG demonstrates atrial fibrillation at 72 Intervals-/08/38 Axis LI Otherwise normal  Assessment and  Plan  Atrial fibrillation-permanent  HFpEF  Hypertension  Orthostatic hypotension   She is doing relatively well.   We will continue her on her current medications.   Blood pressure is well-controlled. There is a little orthostasis.   She remains on anticoagulation.  She is  euvolemic.

## 2015-10-12 DIAGNOSIS — J310 Chronic rhinitis: Secondary | ICD-10-CM | POA: Diagnosis not present

## 2015-10-12 DIAGNOSIS — K219 Gastro-esophageal reflux disease without esophagitis: Secondary | ICD-10-CM | POA: Diagnosis not present

## 2015-10-12 DIAGNOSIS — R1313 Dysphagia, pharyngeal phase: Secondary | ICD-10-CM | POA: Diagnosis not present

## 2015-10-19 ENCOUNTER — Other Ambulatory Visit: Payer: Self-pay | Admitting: Internal Medicine

## 2015-11-02 DIAGNOSIS — J310 Chronic rhinitis: Secondary | ICD-10-CM | POA: Diagnosis not present

## 2015-11-02 DIAGNOSIS — H6532 Chronic mucoid otitis media, left ear: Secondary | ICD-10-CM | POA: Diagnosis not present

## 2015-11-02 DIAGNOSIS — H9072 Mixed conductive and sensorineural hearing loss, unilateral, left ear, with unrestricted hearing on the contralateral side: Secondary | ICD-10-CM | POA: Diagnosis not present

## 2015-11-05 ENCOUNTER — Other Ambulatory Visit: Payer: Self-pay | Admitting: Internal Medicine

## 2015-11-11 ENCOUNTER — Ambulatory Visit (INDEPENDENT_AMBULATORY_CARE_PROVIDER_SITE_OTHER): Payer: Medicare Other | Admitting: Surgery

## 2015-11-11 DIAGNOSIS — Z5181 Encounter for therapeutic drug level monitoring: Secondary | ICD-10-CM | POA: Diagnosis not present

## 2015-11-11 DIAGNOSIS — I4891 Unspecified atrial fibrillation: Secondary | ICD-10-CM

## 2015-11-11 LAB — POCT INR: INR: 2.2

## 2015-12-05 ENCOUNTER — Ambulatory Visit (INDEPENDENT_AMBULATORY_CARE_PROVIDER_SITE_OTHER): Payer: Medicare Other | Admitting: *Deleted

## 2015-12-05 DIAGNOSIS — I4891 Unspecified atrial fibrillation: Secondary | ICD-10-CM | POA: Diagnosis not present

## 2015-12-05 DIAGNOSIS — Z5181 Encounter for therapeutic drug level monitoring: Secondary | ICD-10-CM

## 2015-12-05 LAB — POCT INR: INR: 2.7

## 2015-12-26 ENCOUNTER — Telehealth: Payer: Self-pay | Admitting: Internal Medicine

## 2015-12-26 ENCOUNTER — Ambulatory Visit (INDEPENDENT_AMBULATORY_CARE_PROVIDER_SITE_OTHER)
Admission: RE | Admit: 2015-12-26 | Discharge: 2015-12-26 | Disposition: A | Discharge status: Home / Self Care | Payer: Medicare Other | Source: Ambulatory Visit | Attending: Internal Medicine | Admitting: Internal Medicine

## 2015-12-26 DIAGNOSIS — F419 Anxiety disorder, unspecified: Secondary | ICD-10-CM | POA: Diagnosis not present

## 2015-12-26 DIAGNOSIS — J849 Interstitial pulmonary disease, unspecified: Secondary | ICD-10-CM | POA: Diagnosis not present

## 2015-12-26 DIAGNOSIS — R42 Dizziness and giddiness: Secondary | ICD-10-CM | POA: Diagnosis not present

## 2015-12-26 DIAGNOSIS — I480 Paroxysmal atrial fibrillation: Secondary | ICD-10-CM | POA: Diagnosis not present

## 2015-12-26 DIAGNOSIS — J8489 Other specified interstitial pulmonary diseases: Secondary | ICD-10-CM

## 2015-12-26 DIAGNOSIS — E039 Hypothyroidism, unspecified: Secondary | ICD-10-CM | POA: Diagnosis not present

## 2015-12-26 DIAGNOSIS — I10 Essential (primary) hypertension: Secondary | ICD-10-CM | POA: Diagnosis not present

## 2015-12-26 DIAGNOSIS — J984 Other disorders of lung: Secondary | ICD-10-CM | POA: Diagnosis not present

## 2015-12-26 DIAGNOSIS — E785 Hyperlipidemia, unspecified: Secondary | ICD-10-CM | POA: Diagnosis not present

## 2015-12-26 DIAGNOSIS — M545 Low back pain: Secondary | ICD-10-CM | POA: Diagnosis not present

## 2015-12-26 NOTE — Telephone Encounter (Signed)
Please let Stephanie Cortez know that stable ILD since CT last year   Ct Chest High Resolution  12/26/2015  CLINICAL DATA:  82 year old female with interstitial lung disease presenting for follow-up. EXAM: CT CHEST WITHOUT CONTRAST TECHNIQUE: Multidetector CT imaging of the chest was performed following the standard protocol without intravenous contrast. High resolution imaging of the lungs, as well as inspiratory and expiratory imaging, was performed. COMPARISON:  12/20/2014 high-resolution chest CT study. FINDINGS: Mediastinum/Nodes: Mild cardiomegaly, stable. No significant pericardial fluid/thickening. Left main and left anterior descending coronary atherosclerosis. Atherosclerotic nonaneurysmal thoracic aorta. Stable top-normal main pulmonary artery caliber (3.2 cm diameter). No discrete thyroid nodules. Unremarkable esophagus. No pathologically enlarged axillary, mediastinal or gross hilar lymph nodes, noting limited sensitivity for the detection of hilar adenopathy on this noncontrast study. Lungs/Pleura: No pneumothorax. No pleural effusion. Stable calcified 7 mm granuloma in the left upper lobe. No acute consolidative airspace disease, significant pulmonary nodules or lung masses. Patchy peribronchovascular and subpleural reticulation and faint ground-glass attenuation with associated mild traction bronchiectasis is present in both lungs, upper lobe predominant, not appreciably changed. No frank honeycombing. There is extensive air trapping throughout both lungs on the expiration sequence. Upper abdomen: Granulomatous splenic calcification. Musculoskeletal: No aggressive appearing focal osseous lesions. Moderate thoracic spondylosis. IMPRESSION: 1. Interstitial lung disease characterized by upper lobe predominant patchy peribronchovascular and subpleural reticulation and faint ground-glass attenuation with associated mild traction bronchiectasis. No frank honeycombing. Extensive air trapping. These  findings have not appreciably changed and are most consistent with chronic hypersensitivity pneumonitis. These findings are not consistent with usual interstitial pneumonia (UIP). 2. Two vessel coronary atherosclerosis.  Stable mild cardiomegaly. 3. Aortic atherosclerosis. Electronically Signed   By: Ilona Sorrel M.D.   On: 12/26/2015 15:44

## 2015-12-28 NOTE — Telephone Encounter (Signed)
Patient notified of CT results.  Nothing further needed. 

## 2015-12-30 DIAGNOSIS — E871 Hypo-osmolality and hyponatremia: Secondary | ICD-10-CM | POA: Diagnosis not present

## 2016-01-02 DIAGNOSIS — M5416 Radiculopathy, lumbar region: Secondary | ICD-10-CM | POA: Diagnosis not present

## 2016-01-02 DIAGNOSIS — M4806 Spinal stenosis, lumbar region: Secondary | ICD-10-CM | POA: Diagnosis not present

## 2016-01-04 ENCOUNTER — Ambulatory Visit (INDEPENDENT_AMBULATORY_CARE_PROVIDER_SITE_OTHER): Payer: Medicare Other | Admitting: Internal Medicine

## 2016-01-04 ENCOUNTER — Encounter: Payer: Self-pay | Admitting: Internal Medicine

## 2016-01-04 VITALS — BP 170/90 | HR 82 | Ht 66.0 in | Wt 195.0 lb

## 2016-01-04 DIAGNOSIS — J849 Interstitial pulmonary disease, unspecified: Secondary | ICD-10-CM

## 2016-01-04 DIAGNOSIS — Z01811 Encounter for preprocedural respiratory examination: Secondary | ICD-10-CM | POA: Diagnosis not present

## 2016-01-04 DIAGNOSIS — R0989 Other specified symptoms and signs involving the circulatory and respiratory systems: Secondary | ICD-10-CM | POA: Diagnosis not present

## 2016-01-04 DIAGNOSIS — J8489 Other specified interstitial pulmonary diseases: Secondary | ICD-10-CM

## 2016-01-04 HISTORY — DX: Encounter for preprocedural respiratory examination: Z01.811

## 2016-01-04 NOTE — Progress Notes (Signed)
Subjective:     Patient ID: Stephanie Cortez, female   DOB: 07-16-1933, 82 y.o.   MRN: [id number]  HPI  OV 03/24/2015  Chief Complaint  Patient presents with  . Follow-up    Pt here after PFT in 01/2015. Pt using nocturnal O2 and has noticed a difference the next day in her breathing. Pt states she has a prod cough with discolored mucus, PND, sinus pressure.    Stephanie Cortez is here for follow-up with her daughter Stephanie Cortez, they are presenting for review of ILD workup. I'd always followed up with her baseline diagnosis of bronchiectasis but it started becoming more apparent that she actually has ILD. 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 According to the radiologist it has been stable since 2014 at least. Autoimmune workup showed elevated angiotensin-converting enzyme and CK but otherwise normal. Since the diagnosis of elevated CK her primary care physician has stopped her statin. There is no follow-up CK on this. We also notices that she had nocturnal desaturations restarted her on oxygen and sent her to pulmonary rehabilitation. These 2 measures haven't helped her immensely. She still continues a Symbicort for the prior diagnosis of bronchiectasis. She is inclined to continue that. She will have a flu shot today. She wants to know more about interstitial lung disease and had lot of questions about it  OV  08/11/2015: Acute office visit:  Patient presents to the office today with complaint of chest congestion and cough with yellow mucus intermittent wheezing and shortness of breath that has been ongoing for about 2-3 weeks, but per patient this has worsened over the last 2-3 days. Patient denies fever, chills, chest pain or hemoptysis. Patient endorses scant bloody mucus from her nose, that she feels this is probably related to her nocturnal oxygen use. She presented to her primary care physician Stephanie Cortez at [facility] and was treated initially with a Z-Pak. When  Z-Pak did not resolve patient's symptoms she was then treated with clarithromycin 500 mg twice daily starting 07/25/2015 for a 10 day treatment period . She is currently still taking this antibiotic. States since starting the clarithromycin that she has noticed some improvement.    OV 09/26/2015  Chief Complaint  Patient presents with  . Follow-up    Pt here after seeing SG on 08/10/15 for an acute visit. Pt here after 21mwt and PFT. Pt states she is still not feeling well. Pt c/o headache, hoarseness, wheezing, prod cough. Pt denies increase in SOB and f/c/s.    Routine office visit for this lady with ILD/NSIP pattern (previous diagnosis of bronchiectasis and on Symbicort]. Has associated sinus problems. Her daughter Stephanie Cortez is not with her today.   82 year old female presents for follow-up. She is frustrated by her chronic sinus drainage and chronic sinus tension  in the frontal area. This is been going on for many months a few years. She seen Dr. [name] in ENT and apparently is planning a procedure. In the last several days to a few weeks she's having increasing headache particularly in the last 3 days. However there is no increase in sinus drainage volume or change in color of the sinus drainage [baseline color is much reading between white and yellow and green]  However in the last day or 2 she's increasing cough and wheezing despite taking Symbicort.  She is frustrated by her symptoms. She wants a definite answer currently same time she is reluctant to have surgical lung  biopsy. She is also reluctant to have chronic daily prednisone because of side effects. She is also at the same time frustrated about her extreme level of curiosity and need to search the Internet about her disease condition and then this works her into an anxiety      Spirometry today 09/26/2015 shows FVC 1.55 L/54%, FEV1 1.28 L/59% and a ratio of 83. This comes 6 weeks of her short prednisone burst. This a 300 mL decline in  FVC since August 2016 but an improvement by 170 mL since June 2016   6 minute walk test today: Walk 336 m. Resting pulse ox was 98%. Peak exertion pulse ox was 94%. She had back pain but not shortness of breath.  Last CT scan of the chest was June 2016 which showed NSIP pattern  Last CT scan of the sinuses August 2013 that showed mild mucosal edema in the paranasal sinuses  OV 01/04/2016  Chief Complaint  Patient presents with  . Follow-up    Breathing has been doing well, same as usual.  Discuss oxygen, has not worn oxygen x 2 weeks.  patient only uses at night, discuss testing to get off oxygen.  Having surgery on spine in August, wants to discuss. Wheezing.    FU NSIP with air trapping and cough/wheeze - on symbicort (prior dx in 2015)  - Routine fu. Had annual HRCT - stable ILD with air trapping x 2 years. Overall well. Does not want to use nocturnal o2. But having wheeze and cough that is stable but says symbicort is not helping. Also, reports saysShe is having spine surgery to the low spine in the next few months by Dr [name] and she wants know her preoperative pulmonary risk. She also wants to know the basis of her interstitial lung disease which we have discussed in the past but at this point in time her main issues symptoms. Walking desaturation test 185 feet 3 laps on room at in the office she did not desaturate      [place] Postperative Pulmonary Risk Score Comment Score  Type of surgery - abd ao aneurysm (27), thoracic (21), neurosurgery / upper abdominal / vascular (21), neck (11) lwo spine 0  Emergency Surgery - (11) elective 0  ALbumin < 3 or poor nutritional state - (9) No presume 0  BUN > 30 -  (8) No presunme 0  Partial or completely dependent functional status - (7) no 0  COPD -  (6) ILD + 6  Age - 69 to 70 (4), > 70  (6) Age 40 6  TOTAL  12  Risk Stratifcation scores  - < 10, 11-19, 20-27, 28-40, >40  Low-modearate     CANET Postperative Pulmonary Risk  Score Comment Score  Age - <50 (0), 50-80 (3), >80 (16) 80 16  Preoperative pulse ox - >96 (0), 91-95 (8), <90 (24) 98% RA  0  Respiratory infection in last month - Yes (17) no 0  Preoperative anemia - < 10gm% - Yes (11) No presume 0  Surgical incision - Upper abdominal (15), Thoracic (24) spine 0  Duration of surgery - <2h (0), 2-3h (16), >3h (23) 3h per patinet 16  Emergency Surgery - Yes (8) elective 0  TOTAL  32  Risk Stratification - Low (<26), Intermediate (26-44), High (>45)  intermediate      has a past medical history of Bronchiectasis; Dyslipidemia; Eczema; Anemia; Atrial fibrillation ([place]); Glaucoma; Back pain (2009); Anxiety; Diverticulosis; COPD (chronic obstructive pulmonary disease) ([place]);  Hypertension; Arthritis; IBS (irritable bowel syndrome); H. 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   reports that she has never smoked. She has never used smokeless tobacco.  Past Surgical History  Procedure Laterality Date  . Cataract extraction      both  . Foot surgery  03/14/2011    gastroc slide-rt  . Total abdominal hysterectomy    . Colonoscopy    . Breast surgery      br bx  . Esophagoscopy w/ botox injection  12/11/2011    Procedure: ESOPHAGOSCOPY WITH BOTOX INJECTION;  Surgeon: Rozetta Nunnery, MD;  Location: [facility];  Service: ENT;  Laterality: N/A;  esophogoscopy with dilation, botox injection  . Cardiac catheterization      Allergies  Allergen Reactions  . Doxycycline     Rash on face and neck  . Flagyl [Metronidazole] Nausea And Vomiting  . Moxifloxacin Other (See Comments)    REACTION: weakness  . Pantoprazole Sodium Other (See Comments)    REACTION: rash  . Penicillins Other (See Comments)    REACTION: rash    Immunization History  Administered Date(s) Administered  .  Influenza Split 04/06/2011, 04/03/2012  . Influenza Whole 06/01/2009, 04/03/2010  . Influenza,inj,Quad PF,36+ Mos 03/10/2013, 03/24/2015  . Influenza-Unspecified 04/01/2014  . Pneumococcal Polysaccharide-23 07/04/2006    Family History  Problem Relation Age of Onset  . Hypertension Mother   . Stroke Mother   . Atrial fibrillation      siblings  . Emphysema Brother   . Other Father     miner's lung  . Cancer Father     Lung  . Colon polyps Sister   . Pancreatic cancer Sister   . Kidney disease Sister      Current outpatient prescriptions:  .  BENICAR 40 MG tablet, TAKE 1 TABLET BY MOUTH DAILY, Disp: 90 tablet, Rfl: 1 .  budesonide-formoterol (SYMBICORT) 80-4.5 MCG/ACT inhaler, INHALE 2 PUFFS INTO THE LUNGS TWICE DAILY, Disp: 10.2 g, Rfl: 6 .  cetirizine (ZYRTEC) 10 MG tablet, Take 10 mg by mouth daily as needed. , Disp: , Rfl:  .  Cholecalciferol (VITAMIN D PO), Take 1 tablet by mouth daily., Disp: , Rfl:  .  Cyanocobalamin (VITAMIN B-12 PO), Take 1 tablet by mouth daily., Disp: , Rfl:  .  diltiazem (CARDIZEM CD) 120 MG 24 hr capsule, Take 1 capsule (120 mg total) by mouth daily., Disp: 90 capsule, Rfl: 3 .  FLUoxetine (PROZAC) 10 MG capsule, TAKE ONE CAPSULE BY MOUTH EVERY DAY, Disp: 90 capsule, Rfl: 0 .  furosemide (LASIX) 20 MG tablet, TAKE 1 TABLET(20 MG) BY MOUTH DAILY AS NEEDED, Disp: 30 tablet, Rfl: 11 .  guaiFENesin (MUCINEX) 600 MG 12 hr tablet, Take 1 tablet (600 mg total) by mouth 2 (two) times daily., Disp: 60 tablet, Rfl: 1 .  levothyroxine (SYNTHROID, LEVOTHROID) 50 MCG tablet, Take 1 tablet (50 mcg total) by mouth daily., Disp: 90 tablet, Rfl: 3 .  MAGNESIUM PO, Take 1 tablet by mouth daily. , Disp: , Rfl:  .  mesalamine (LIALDA) 1.2 g EC tablet, Take 2 tablets (2.4 g total) by mouth daily., Disp: 60 tablet, Rfl: 5 .  nebivolol (BYSTOLIC) 5 MG tablet, Take 1 tablet (5 mg total) by mouth daily., Disp: 90 tablet, Rfl: 3 .  omeprazole (PRILOSEC) 20 MG capsule, TAKE 1  CAPSULE(20 MG) BY MOUTH DAILY, Disp: 90 capsule, Rfl: 4 .  OXYGEN, Inhale into the lungs. 2 Liters only at night, Disp: , Rfl:  .  Potassium (POTASSIMIN) 75 MG TABS, Take 1 tablet by mouth daily. 1 tab po qd, Disp: , Rfl:  .  Respiratory Therapy Supplies (FLUTTER) DEVI, Use as directed, Disp: 1 each, Rfl: 0 .  selenium 50 MCG TABS, Take 50 mcg by mouth daily., Disp: , Rfl:  .  sodium chloride (OCEAN) 0.65 % SOLN nasal spray, Place 1 spray into both nostrils as needed for congestion., Disp: , Rfl:  .  warfarin (COUMADIN) 2.5 MG tablet, TAKE AS DIRECTED BY COUMADIN CLINIC, Disp: 35 tablet, Rfl: 3 .  Zinc 22.5 MG TABS, Take by mouth daily., Disp: , Rfl:      Review of Systems     Objective:   Physical Exam  Constitutional: She is oriented to person, place, and time. She appears well-developed and well-nourished. No distress.  HENT:  Head: Normocephalic and atraumatic.  Right Ear: External ear normal.  Left Ear: External ear normal.  Mouth/Throat: Oropharynx is clear and moist. No oropharyngeal exudate.  Eyes: Conjunctivae and EOM are normal. Pupils are equal, round, and reactive to light. Right eye exhibits no discharge. Left eye exhibits no discharge. No scleral icterus.  Neck: Normal range of motion. Neck supple. No JVD present. No tracheal deviation present. No thyromegaly present.  Cardiovascular: Normal rate, regular rhythm, normal heart sounds and intact distal pulses.  Exam reveals no gallop and no friction rub.   No murmur heard. Pulmonary/Chest: Effort normal and breath sounds normal. No respiratory distress. She has no wheezes. She has no rales. She exhibits no tenderness.  Abdominal: Soft. Bowel sounds are normal. She exhibits no distension and no mass. There is no tenderness. There is no rebound and no guarding.  Musculoskeletal: Normal range of motion. She exhibits no edema or tenderness.  Lymphadenopathy:    She has no cervical adenopathy.  Neurological: She is alert and  oriented to person, place, and time. She has normal reflexes. No cranial nerve deficit. She exhibits normal muscle tone. Coordination normal.  Skin: Skin is warm and dry. No rash noted. She is not diaphoretic. No erythema. No pallor.  Psychiatric: She has a normal mood and affect. Her behavior is normal. Judgment and thought content normal.  Vitals reviewed.   Filed Vitals:   01/04/16 1341  BP: 170/90  Pulse: 82  Height: 5\' 6"  (1.676 m)  Weight: 195 lb (88.451 kg)  SpO2: 98%       Assessment:       ICD-9-CM ICD-10-CM   1. ILD (interstitial lung disease) ([place]) 515 J84.9   2. NSIP (nonspecific interstitial pneumonia) ([place]) 516.8 J84.89   3. Pulmonary air trapping 786.9 R09.89   4. Encounter for preoperative pulmonary examination V72.82 Z01.811        Plan:     1. ILD (interstitial lung disease) ([place]) 2. NSIP (nonspecific interstitial pneumonia) ([place]) - stable disease since 2015  - discussed biopsy but given stability, risk and non-IPF pattern will continue to watch  3. Pulmonary air trapping - I think symptoms are due to this and likely related to above  - will try inhaler rotation  - stop symbicort -= start spiriva respimat daily AM and breo daily PM  4. Encounter for preoperative pulmonary examination *Low- moderate risk for pulmonary complicatiions from spine surgery  - Wil let DR [name] know  Followup Flu shot in fall 6 months or sooner if needed      > 50% of  this > 25 min visit spent in face to face counseling or coordination of care   Dr. Brand Males, M.D., [employer].C.P Pulmonary and Critical Care Medicine Staff Physician [facility] Pager: [phone], If no answer or between  15:00h - 7:00h: call [phone]  01/04/2016 2:21 PM

## 2016-01-04 NOTE — Patient Instructions (Signed)
1. ILD (interstitial lung disease) ([place]) 2. NSIP (nonspecific interstitial pneumonia) ([place]) - stable disease since 2015  - discussed biopsy but given stability, risk and non-IPF pattern will continue to watch  3. Pulmonary air trapping - I think symptoms are due to this and likely related to above  - will try inhaler rotation  - stop symbicort -= start spiriva respimat daily AM and breo daily PM  4. Encounter for preoperative pulmonary examination *Low- moderate risk for pulmonary complicatiions from spine surgery  - Wil let DR [name] know  Followup Flu shot in fall 6 months or sooner if needed

## 2016-01-12 DIAGNOSIS — M545 Low back pain: Secondary | ICD-10-CM | POA: Diagnosis not present

## 2016-01-18 DIAGNOSIS — E871 Hypo-osmolality and hyponatremia: Secondary | ICD-10-CM | POA: Diagnosis not present

## 2016-01-19 ENCOUNTER — Telehealth: Payer: Self-pay | Admitting: Internal Medicine

## 2016-01-19 DIAGNOSIS — J849 Interstitial pulmonary disease, unspecified: Secondary | ICD-10-CM

## 2016-01-19 NOTE — Telephone Encounter (Signed)
LM for Mandy/[facility] x 1

## 2016-01-19 NOTE — Telephone Encounter (Signed)
Received a call from [place] at [facility], she said that she noticed in the last OV note that patient was requesting to get off oxygen at night.  She wants to know if Dr. Chase Caller wants patient to have ONO to determine if she should be off the oxygen at night.   MR - please advise.

## 2016-01-19 NOTE — Telephone Encounter (Signed)
It would be ideal to get ono room air to determine if she can come off o2 at night but if patient does not really care and wants to come off it is choice of patient Stephanie Cortez and she can come off

## 2016-01-20 ENCOUNTER — Encounter: Payer: Self-pay | Admitting: Cardiology

## 2016-01-20 DIAGNOSIS — I10 Essential (primary) hypertension: Secondary | ICD-10-CM | POA: Diagnosis not present

## 2016-01-20 DIAGNOSIS — Z7901 Long term (current) use of anticoagulants: Secondary | ICD-10-CM | POA: Diagnosis not present

## 2016-01-20 DIAGNOSIS — I482 Chronic atrial fibrillation: Secondary | ICD-10-CM | POA: Diagnosis not present

## 2016-01-20 DIAGNOSIS — E785 Hyperlipidemia, unspecified: Secondary | ICD-10-CM | POA: Diagnosis not present

## 2016-01-20 NOTE — Telephone Encounter (Signed)
Called spoke with [facility]. I reviewed MR's recs. She voiced understanding and had no further questions. ONO order has been placed. Nothing further needed at this time.

## 2016-01-26 ENCOUNTER — Telehealth: Payer: Self-pay | Admitting: Internal Medicine

## 2016-01-26 MED ORDER — CLARITHROMYCIN 500 MG PO TABS
500.0000 mg | ORAL_TABLET | Freq: Every day | ORAL | 0 refills | Status: AC
Start: 2016-01-26 — End: 2016-02-02

## 2016-01-26 NOTE — Telephone Encounter (Signed)
Pt called to say that she is on spiriva and breo. Been on symbicort but decided to change. She is just about done with them and looked up side effects and said that some of the effects are what she is experiencing. Says she may not need new meds if the spiriva and breo could be causing the issues??? pls advise

## 2016-01-26 NOTE — Telephone Encounter (Signed)
Pt c/o some chest congestion with little tightness, cough with very little mucus production and some SOB - pt states that she is not sure it is related to her current symptoms. Mucus production is very little not much color to it. Pt states that her symptoms have been present for about 4 days. Denies fever. Pt states that she is okay with getting a Zpak but would prefer something different - has taken Clarithromycin in the past and tolerates it well.   Pt aware that MR is not available and that we will send this message to the DOD Dr Annamaria Boots.  Please advise Dr Annamaria Boots. Thanks.    Allergies  Allergen Reactions  . Doxycycline     Rash on face and neck  . Flagyl [Metronidazole] Nausea And Vomiting  . Moxifloxacin Other (See Comments)    REACTION: weakness  . Pantoprazole Sodium Other (See Comments)    REACTION: rash  . Penicillins Other (See Comments)    REACTION: rash     Medication List       Accurate as of 01/26/16 11:20 AM. Always use your most recent med list.          BENICAR 40 MG tablet Generic drug:  olmesartan TAKE 1 TABLET BY MOUTH DAILY   budesonide-formoterol 80-4.5 MCG/ACT inhaler Commonly known as:  SYMBICORT INHALE 2 PUFFS INTO THE LUNGS TWICE DAILY   cetirizine 10 MG tablet Commonly known as:  ZYRTEC Take 10 mg by mouth daily as needed.   diltiazem 120 MG 24 hr capsule Commonly known as:  CARDIZEM CD Take 1 capsule (120 mg total) by mouth daily.   FLUoxetine 10 MG capsule Commonly known as:  PROZAC TAKE ONE CAPSULE BY MOUTH EVERY DAY   FLUTTER Devi Use as directed   furosemide 20 MG tablet Commonly known as:  LASIX TAKE 1 TABLET(20 MG) BY MOUTH DAILY AS NEEDED   guaiFENesin 600 MG 12 hr tablet Commonly known as:  MUCINEX Take 1 tablet (600 mg total) by mouth 2 (two) times daily.   levothyroxine 50 MCG tablet Commonly known as:  SYNTHROID, LEVOTHROID Take 1 tablet (50 mcg total) by mouth daily.   MAGNESIUM PO Take 1 tablet by mouth daily.    mesalamine 1.2 g EC tablet Commonly known as:  LIALDA Take 2 tablets (2.4 g total) by mouth daily.   nebivolol 5 MG tablet Commonly known as:  BYSTOLIC Take 1 tablet (5 mg total) by mouth daily.   omeprazole 20 MG capsule Commonly known as:  PRILOSEC TAKE 1 CAPSULE(20 MG) BY MOUTH DAILY   OXYGEN Inhale into the lungs. 2 Liters only at night   POTASSIMIN 75 MG Tabs Generic drug:  Potassium Take 1 tablet by mouth daily. 1 tab po qd   selenium 50 MCG Tabs tablet Take 50 mcg by mouth daily.   sodium chloride 0.65 % Soln nasal spray Commonly known as:  OCEAN Place 1 spray into both nostrils as needed for congestion.   VITAMIN B-12 PO Take 1 tablet by mouth daily.   VITAMIN D PO Take 1 tablet by mouth daily.   warfarin 2.5 MG tablet Commonly known as:  COUMADIN TAKE AS DIRECTED BY COUMADIN CLINIC   Zinc 22.5 MG Tabs Take by mouth daily.

## 2016-01-26 NOTE — Telephone Encounter (Signed)
Spoke with the pt and notified of recs per MW  She verbalized understanding  Nothing further needed 

## 2016-01-26 NOTE — Telephone Encounter (Signed)
Called spoke with patient, advised of CY's recommendations as stated below  Advised pt will also send to MR regarding her concerns about the [facility]  When sending Rx to pharmacy, these warnings came up - Dr Annamaria Boots please advise, thank you. Triage is aware that pt requested Clarithromycin  Drug-Drug: clarithromycin and warfarin  Hypoprothrombinemic effects of Anticoagulants may be increased by Macrolide and Ketolides. Bleeding may occur.  Details      Remove clarithromycin (BIAXIN) 500 MG tablet Prescription. New.   Discontinue warfarin (COUMADIN) 2.5 MG tablet Prescription. Active. Long-term.    Drug/Drug  High  Drug-Drug: clarithromycin and diltiazem  Plasma concentrations of Macrolides may be increased by concurrent use of Diltiazem increasing the risk for sudden death from cardiac causes. Additionally, coadministration of Macrolides with Diltiazem may increase the risk of hypotension requiring hospitalization.  Details      Remove clarithromycin (BIAXIN) 500 MG tablet Prescription. New.   Discontinue diltiazem (CARDIZEM CD) 120 MG 24 hr capsule Prescription. Active. Long-term.   Dose High  High  High Dose: clarithromycin, 500 mg, Oral, 2 times daily  Single dose of 500 mg exceeds recommended maximum of 250 mg, over by 100%  Daily dose of 1,000 mg exceeds recommended maximum of 500 mg, over by 100%  Details      Remove clarithromycin (BIAXIN) 500 MG tablet Prescription. New.

## 2016-01-26 NOTE — Telephone Encounter (Signed)
Offer biaxin 500 mg, # 10, 1 twice daily Ok to take Mucinex-DM

## 2016-01-26 NOTE — Telephone Encounter (Signed)
Would just use biaxin 500 mg daily x 7 days and should be able to tolerate it but let the coumdin clinic know she's on it.    The cardizem dose is on the low side and should not be a problem if shes's been on biaxin and cardizem combo previously

## 2016-01-27 ENCOUNTER — Other Ambulatory Visit: Payer: Self-pay | Admitting: Internal Medicine

## 2016-01-27 DIAGNOSIS — M5416 Radiculopathy, lumbar region: Secondary | ICD-10-CM | POA: Diagnosis not present

## 2016-01-30 DIAGNOSIS — I10 Essential (primary) hypertension: Secondary | ICD-10-CM | POA: Diagnosis not present

## 2016-01-31 ENCOUNTER — Ambulatory Visit (INDEPENDENT_AMBULATORY_CARE_PROVIDER_SITE_OTHER): Payer: Medicare Other | Admitting: *Deleted

## 2016-01-31 DIAGNOSIS — I4891 Unspecified atrial fibrillation: Secondary | ICD-10-CM

## 2016-01-31 DIAGNOSIS — Z5181 Encounter for therapeutic drug level monitoring: Secondary | ICD-10-CM

## 2016-01-31 DIAGNOSIS — I482 Chronic atrial fibrillation, unspecified: Secondary | ICD-10-CM

## 2016-01-31 LAB — POCT INR: INR: 2.4

## 2016-02-03 ENCOUNTER — Telehealth: Payer: Self-pay | Admitting: Internal Medicine

## 2016-02-03 MED ORDER — TIOTROPIUM BROMIDE MONOHYDRATE 2.5 MCG/ACT IN AERS: 2.0000 | INHALATION_SPRAY | Freq: Every day | RESPIRATORY_TRACT | 0 refills | Status: DC

## 2016-02-03 MED ORDER — FLUTICASONE FUROATE-VILANTEROL 100-25 MCG/INH IN AEPB: 1.0000 | INHALATION_SPRAY | Freq: Every day | RESPIRATORY_TRACT | 0 refills | Status: DC

## 2016-02-03 NOTE — Telephone Encounter (Signed)
Spoke with the pt  She states that she is doing well with Spiriva and Breo and states she would like 1 more sample before we sent rx  She wants to be sure she really wants rx  I left her samples up front  Nothing further needed per pt

## 2016-02-06 ENCOUNTER — Other Ambulatory Visit: Payer: Self-pay | Admitting: Internal Medicine

## 2016-02-06 ENCOUNTER — Telehealth: Payer: Self-pay | Admitting: Internal Medicine

## 2016-02-06 NOTE — Telephone Encounter (Signed)
Patient states that she saw cardiologist, Dr. Wynonia Lawman, recommended that she have a sleep study.  Patient does not want to have sleep study done through their office, only through our office.  She said that she is doing her ONO tonight and we should have results from that within a day or so.  She wants to know if Dr. Chase Caller feels she needs a sleep study and if so, can we order a Home Sleep Test from our office?  Dr. Chase Caller, please advise.

## 2016-02-07 NOTE — Telephone Encounter (Signed)
1 . Why she does not want sleep study with cards? 2. I want to review her ono before deciding on sleep study 3. I do not spcialize in sleep med wil have to have her see one of our sleep docs if we decide to go that route  Thanks  Dr. Brand Males, M.D., [facility].C.P Pulmonary and Critical Care Medicine Staff Physician [place] [facility] Pager: [phone], If no answer or between  15:00h - 7:00h: call [phone]  02/07/2016 5:22 AM

## 2016-02-07 NOTE — Telephone Encounter (Signed)
lmtcb x1 for pt. 

## 2016-02-08 NOTE — Telephone Encounter (Signed)
Spoke with pt. States that she does not have any issue with cardiology with ordering sleep study. She had the ONO done several nights ago and would like to have the results of this.  MR - please advise. Thanks.

## 2016-02-10 ENCOUNTER — Encounter (HOSPITAL_BASED_OUTPATIENT_CLINIC_OR_DEPARTMENT_OTHER): Payer: Self-pay

## 2016-02-13 NOTE — Telephone Encounter (Signed)
Called and spoke with pt and she stated that she has only had the ONO done.  This was done through [facility].  I have called [facility] and am waiting on fax of these results to give to MR.

## 2016-02-13 NOTE — Telephone Encounter (Signed)
pls get me the ono result asap;  Thanks  MR  Dr. Brand Males, M.D., [facility].C.P Pulmonary and Critical Care Medicine Staff Physician [facility] Pager: [phone], If no answer or between  15:00h - 7:00h: call [phone]  02/13/2016 3:04 PM

## 2016-02-14 ENCOUNTER — Telehealth: Payer: Self-pay | Admitting: Pharmacist

## 2016-02-14 NOTE — Telephone Encounter (Signed)
Daneil Dan have you seen this ONO report from [facility]?  thanks

## 2016-02-14 NOTE — Telephone Encounter (Signed)
Received fax from [facility].  Pt needs to have a lumbar epidural spinal injection.  Reviewed chart.  Pt has a CHADS score of 3 (HTN, CHF, age).  No history of stroke or TIA.  Ok to hold Coumadin x 7 days.  Will fax to [place].

## 2016-02-14 NOTE — Telephone Encounter (Signed)
ATC [facility] but since after 5pm the office is closed, will call 8/16 for ONO as I have not received this.

## 2016-02-15 NOTE — Telephone Encounter (Signed)
Spoke with Gilda at [place] is being refaxed to our office.  Will await fax.

## 2016-02-16 NOTE — Telephone Encounter (Signed)
ONO still has not been received. Called and spoke to Yancey at [facility] to have ONO re-faxed. Will await fax.

## 2016-02-17 ENCOUNTER — Telehealth: Payer: Self-pay | Admitting: Internal Medicine

## 2016-02-17 NOTE — Telephone Encounter (Signed)
rec  Try change back to symbicort and stop the breo and spiriva (powders can def cause cough whereas symbicort less likely

## 2016-02-17 NOTE — Telephone Encounter (Signed)
Pt c/o wheezing - feels from the Spiriva Resp. Pt states that she stopped the Symbicort as directed per MR.  Pt is now taking her second set of samples of Spiriva Resp and Breo. Pt states that her symptoms didn't start until she began her second round of the sample meds. Pt states that after she take the medications she begins to cough and has coughing spasms with wheezing - pt states that she feels it is the Spiriva and not the Breo but states that she coughs/wheezes after she take both meds. Take Spiriva in AM and Breo in PM.  Pt states that she is rinsing her mouth after using both inhalers.    Please advise Dr Melvyn Novas as Dr Chase Caller is not available today. Thanks.   Plan:   1. ILD (interstitial lung disease) ([place]) 2. NSIP (nonspecific interstitial pneumonia) ([place]) - stable disease since 2015  - discussed biopsy but given stability, risk and non-IPF pattern will continue to watch  3. Pulmonary air trapping - I think symptoms are due to this and likely related to above  - will try inhaler rotation  - stop symbicort -= start spiriva respimat daily AM and breo daily PM  4. Encounter for preoperative pulmonary examination *Low- moderate risk for pulmonary complicatiions from spine surgery  - Wil let DR [name] know  Followup Flu shot in fall 6 months or sooner if needed

## 2016-02-17 NOTE — Telephone Encounter (Signed)
Called and spoke with pt and she is aware of MW recs. . She will call back in 1 week to give an update.

## 2016-02-20 ENCOUNTER — Ambulatory Visit (INDEPENDENT_AMBULATORY_CARE_PROVIDER_SITE_OTHER): Payer: Medicare Other | Admitting: Internal Medicine

## 2016-02-20 ENCOUNTER — Encounter: Payer: Self-pay | Admitting: Internal Medicine

## 2016-02-20 VITALS — BP 158/72 | HR 84 | Temp 98.3°F | Ht 66.0 in | Wt 193.8 lb

## 2016-02-20 DIAGNOSIS — J849 Interstitial pulmonary disease, unspecified: Secondary | ICD-10-CM | POA: Diagnosis not present

## 2016-02-20 DIAGNOSIS — J209 Acute bronchitis, unspecified: Secondary | ICD-10-CM | POA: Diagnosis not present

## 2016-02-20 DIAGNOSIS — R0989 Other specified symptoms and signs involving the circulatory and respiratory systems: Secondary | ICD-10-CM

## 2016-02-20 DIAGNOSIS — H04129 Dry eye syndrome of unspecified lacrimal gland: Secondary | ICD-10-CM | POA: Diagnosis not present

## 2016-02-20 DIAGNOSIS — J8489 Other specified interstitial pulmonary diseases: Secondary | ICD-10-CM

## 2016-02-20 DIAGNOSIS — H524 Presbyopia: Secondary | ICD-10-CM | POA: Diagnosis not present

## 2016-02-20 DIAGNOSIS — H1013 Acute atopic conjunctivitis, bilateral: Secondary | ICD-10-CM | POA: Diagnosis not present

## 2016-02-20 DIAGNOSIS — H40013 Open angle with borderline findings, low risk, bilateral: Secondary | ICD-10-CM | POA: Diagnosis not present

## 2016-02-20 HISTORY — DX: Acute bronchitis, unspecified: J20.9

## 2016-02-20 MED ORDER — AZITHROMYCIN 250 MG PO TABS: ORAL_TABLET | ORAL | 0 refills | Status: DC

## 2016-02-20 MED ORDER — BUDESONIDE-FORMOTEROL FUMARATE 80-4.5 MCG/ACT IN AERO: INHALATION_SPRAY | RESPIRATORY_TRACT | 6 refills | Status: DC

## 2016-02-20 NOTE — Progress Notes (Signed)
Subjective:     Patient ID: Stephanie Cortez, female   DOB: 11-15-1933, 82 y.o.   MRN: [id number]  HPI  OV 03/24/2015  Chief Complaint  Patient presents with  . Follow-up    Pt here after PFT in 01/2015. Pt using nocturnal O2 and has noticed a difference the next day in her breathing. Pt states she has a prod cough with discolored mucus, PND, sinus pressure.    Ms. Zenner is here for follow-up with her daughter Stephanie Cortez, they are presenting for review of ILD workup. I'd always followed up with her baseline diagnosis of bronchiectasis but it started becoming more apparent that she actually has ILD. 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 According to the radiologist it has been stable since 2014 at least. Autoimmune workup showed elevated angiotensin-converting enzyme and CK but otherwise normal. Since the diagnosis of elevated CK her primary care physician has stopped her statin. There is no follow-up CK on this. We also notices that she had nocturnal desaturations restarted her on oxygen and sent her to pulmonary rehabilitation. These 2 measures haven't helped her immensely. She still continues a Symbicort for the prior diagnosis of bronchiectasis. She is inclined to continue that. She will have a flu shot today. She wants to know more about interstitial lung disease and had lot of questions about it  OV  08/11/2015: Acute office visit:  Patient presents to the office today with complaint of chest congestion and cough with yellow mucus intermittent wheezing and shortness of breath that has been ongoing for about 2-3 weeks, but per patient this has worsened over the last 2-3 days. Patient denies fever, chills, chest pain or hemoptysis. Patient endorses scant bloody mucus from her nose, that she feels this is probably related to her nocturnal oxygen use. She presented to her primary care physician [name] at [facility] and was treated initially with a Z-Pak. When  Z-Pak did not resolve patient's symptoms she was then treated with clarithromycin 500 mg twice daily starting 07/25/2015 for a 10 day treatment period . She is currently still taking this antibiotic. States since starting the clarithromycin that she has noticed some improvement.    OV 09/26/2015  Chief Complaint  Patient presents with  . Follow-up    Pt here after seeing SG on 08/10/15 for an acute visit. Pt here after 63mwt and PFT. Pt states she is still not feeling well. Pt c/o headache, hoarseness, wheezing, prod cough. Pt denies increase in SOB and f/c/s.    Routine office visit for this lady with ILD/NSIP pattern (previous diagnosis of bronchiectasis and on Symbicort]. Has associated sinus problems. Her daughter Stephanie Cortez is not with her today.   82 year old female presents for follow-up. She is frustrated by her chronic sinus drainage and chronic sinus tension  in the frontal area. This is been going on for many months a few years. She seen Dr. [name] in ENT and apparently is planning a procedure. In the last several days to a few weeks she's having increasing headache particularly in the last 3 days. However there is no increase in sinus drainage volume or change in color of the sinus drainage [baseline color is much reading between white and yellow and green]  However in the last day or 2 she's increasing cough and wheezing despite taking Symbicort.  She is frustrated by her symptoms. She wants a definite answer currently same time she is reluctant to have surgical lung  biopsy. She is also reluctant to have chronic daily prednisone because of side effects. She is also at the same time frustrated about her extreme level of curiosity and need to search the Internet about her disease condition and then this works her into an anxiety      Spirometry today 09/26/2015 shows FVC 1.55 L/54%, FEV1 1.28 L/59% and a ratio of 83. This comes 6 weeks of her short prednisone burst. This a 300 mL decline in  FVC since August 2016 but an improvement by 170 mL since June 2016   6 minute walk test today: Walk 336 m. Resting pulse ox was 98%. Peak exertion pulse ox was 94%. She had back pain but not shortness of breath.  Last CT scan of the chest was June 2016 which showed NSIP pattern  Last CT scan of the sinuses August 2013 that showed mild mucosal edema in the paranasal sinuses    OV 01/04/2016  Chief Complaint  Patient presents with  . Follow-up    Breathing has been doing well, same as usual.  Discuss oxygen, has not worn oxygen x 2 weeks.  patient only uses at night, discuss testing to get off oxygen.  Having surgery on spine in August, wants to discuss. Wheezing.    FU NSIP with air trapping and cough/wheeze - on symbicort (prior dx in 2015)  - Routine fu. Had annual HRCT - stable ILD with air trapping x 2 years. Overall well. Does not want to use nocturnal o2. But having wheeze and cough that is stable but says symbicort is not helping. Also, reports saysShe is having spine surgery to the low spine in the next few months by Dr [name] and she wants know her preoperative pulmonary risk. She also wants to know the basis of her interstitial lung disease which we have discussed in the past but at this point in time her main issues symptoms. Walking desaturation test 185 feet 3 laps on room at in the office she did not desaturate   OV 02/20/2016  Chief Complaint  Patient presents with  . Acute Visit    Pt c/o prod cough with yellow mucus with scant amount of blood, increase in SOB, wheezing. Pt states the spiriva makes her cough.    FU NSIP with air trapping and cough/wheeze - on symbicort (prior dx in 2015).  Last visit in July 2017 because of air trapping we took her off Symbicort and tried Spiriva and Brio she called on 02/17/2016 increasing cough. She was rotated back to Symbicort. She clearly attributes the worsening cough and symptoms to Spiriva. She feels that after going on  Symbicort she is better. However she is having some yellow mucus and feels she is having acute bronchitis. She has multiple drug allergies listed below. Regarding her interstitial lung disease this is been stable for 2 years. Last visit walking desaturation test did not desaturate. She then had overnight oxygen study 02/07/2016 and results are below and she does not need nocturnal oxygen. Therefore she will not need sleep study  Allergies  Allergen Reactions  . Doxycycline     Rash on face and neck  . Flagyl [Metronidazole] Nausea And Vomiting  . Moxifloxacin Other (See Comments)    REACTION: weakness  . Pantoprazole Sodium Other (See Comments)    REACTION: rash  . Penicillins Other (See Comments)    REACTION: rash      Overnight oxygen test 02/07/2016 on Room air total recording time was 4 hours and 46  minutes. Highest pulse was 89/m) 50/m. Highest pulse ox was 98%. Lowest pulse ox was 86%. Mean pulse ox was 93.6%. Total time with pulse ox less than 90% was 1 minute and 16 seconds. Total pulse ox less than 89 was 44 seconds. Longest continuous time with saturation less than or equal to 88% was 16 seconds.    has a past medical history of Anemia; Anxiety; Atrial fibrillation ([place]); Bronchiectasis; CHF (congestive heart failure) ([place]); COPD (chronic obstructive pulmonary disease) ([place]); Cricopharyngeal achalasia; Diverticulosis; Dyslipidemia; Eczema; GERD with stricture; Glaucoma; H. pylori infection; Hypertension; Hyponatremia; Hypothyroid; IBS (irritable bowel syndrome); Lumbar disc disease; and Segmental colitis ([place]).   reports that she has never smoked. She has never used smokeless tobacco.  Past Surgical History:  Procedure Laterality Date  . BREAST SURGERY     br bx  . CARDIAC CATHETERIZATION    . CATARACT EXTRACTION     both  . COLONOSCOPY    . ESOPHAGOSCOPY W/ BOTOX INJECTION  12/11/2011   Procedure: ESOPHAGOSCOPY WITH BOTOX INJECTION;  Surgeon: Rozetta Nunnery, MD;   Location: [place];  Service: ENT;  Laterality: N/A;  esophogoscopy with dilation, botox injection  . FOOT SURGERY  03/14/2011   gastroc slide-rt  . TOTAL ABDOMINAL HYSTERECTOMY      Allergies  Allergen Reactions  . Doxycycline     Rash on face and neck  . Flagyl [Metronidazole] Nausea And Vomiting  . Moxifloxacin Other (See Comments)    REACTION: weakness  . Pantoprazole Sodium Other (See Comments)    REACTION: rash  . Penicillins Other (See Comments)    REACTION: rash    Immunization History  Administered Date(s) Administered  . Influenza Split 04/06/2011, 04/03/2012  . Influenza Whole 06/01/2009, 04/03/2010  . Influenza,inj,Quad PF,36+ Mos 03/10/2013, 03/24/2015  . Influenza-Unspecified 04/01/2014  . Pneumococcal Polysaccharide-23 07/04/2006    Family History  Problem Relation Age of Onset  . Hypertension Mother   . Stroke Mother   . Atrial fibrillation      siblings  . Emphysema Brother   . Other Father     miner's lung  . Cancer Father     Lung  . Colon polyps Sister   . Pancreatic cancer Sister   . Kidney disease Sister      Current Outpatient Prescriptions:  .  BENICAR 40 MG tablet, TAKE 1 TABLET BY MOUTH DAILY, Disp: 90 tablet, Rfl: 1 .  cetirizine (ZYRTEC) 10 MG tablet, Take 10 mg by mouth daily as needed. , Disp: , Rfl:  .  Cholecalciferol (VITAMIN D PO), Take 1 tablet by mouth daily., Disp: , Rfl:  .  Cyanocobalamin (VITAMIN B-12 PO), Take 1 tablet by mouth daily., Disp: , Rfl:  .  diltiazem (CARDIZEM CD) 120 MG 24 hr capsule, Take 1 capsule (120 mg total) by mouth daily., Disp: 90 capsule, Rfl: 3 .  FLUoxetine (PROZAC) 10 MG capsule, TAKE ONE CAPSULE BY MOUTH EVERY DAY, Disp: 90 capsule, Rfl: 0 .  fluticasone furoate-vilanterol (BREO ELLIPTA) 100-25 MCG/INH AEPB, Inhale 1 puff into the lungs daily., Disp: 28 each, Rfl: 0 .  furosemide (LASIX) 20 MG tablet, TAKE 1 TABLET(20 MG) BY MOUTH DAILY AS NEEDED, Disp: 30 tablet, Rfl: 11 .   guaiFENesin (MUCINEX) 600 MG 12 hr tablet, Take 1 tablet (600 mg total) by mouth 2 (two) times daily., Disp: 60 tablet, Rfl: 1 .  levothyroxine (SYNTHROID, LEVOTHROID) 50 MCG tablet, Take 1 tablet (50 mcg total) by mouth daily., Disp: 90 tablet, Rfl: 3 .  MAGNESIUM PO, Take 1 tablet by mouth daily. , Disp: , Rfl:  .  mesalamine (LIALDA) 1.2 g EC tablet, Take 2 tablets (2.4 g total) by mouth daily., Disp: 60 tablet, Rfl: 5 .  nebivolol (BYSTOLIC) 5 MG tablet, Take 1 tablet (5 mg total) by mouth daily., Disp: 90 tablet, Rfl: 3 .  omeprazole (PRILOSEC) 20 MG capsule, TAKE 1 CAPSULE(20 MG) BY MOUTH DAILY, Disp: 90 capsule, Rfl: 4 .  OXYGEN, Inhale into the lungs. 2 Liters only at night, Disp: , Rfl:  .  Potassium (POTASSIMIN) 75 MG TABS, Take 1 tablet by mouth daily. 1 tab po qd, Disp: , Rfl:  .  Respiratory Therapy Supplies (FLUTTER) DEVI, Use as directed, Disp: 1 each, Rfl: 0 .  selenium 50 MCG TABS, Take 50 mcg by mouth daily., Disp: , Rfl:  .  sodium chloride (OCEAN) 0.65 % SOLN nasal spray, Place 1 spray into both nostrils as needed for congestion., Disp: , Rfl:  .  warfarin (COUMADIN) 2.5 MG tablet, Take 1/2-1 tablet by mouth daily as directed by [facility], Disp: 90 tablet, Rfl: 1 .  Zinc 22.5 MG TABS, Take by mouth daily., Disp: , Rfl:  .  Tiotropium Bromide Monohydrate (SPIRIVA RESPIMAT) 2.5 MCG/ACT AERS, Inhale 2 puffs into the lungs daily. (Patient not taking: Reported on 02/20/2016), Disp: 2 Inhaler, Rfl: 0    Review of Systems     Objective:   Physical Exam  Constitutional: She is oriented to person, place, and time. She appears well-developed and well-nourished. No distress.  HENT:  Head: Normocephalic and atraumatic.  Right Ear: External ear normal.  Left Ear: External ear normal.  Mouth/Throat: Oropharynx is clear and moist. No oropharyngeal exudate.  Eyes: Conjunctivae and EOM are normal. Pupils are equal, round, and reactive to light. Right eye exhibits no discharge.  Left eye exhibits no discharge. No scleral icterus.  Neck: Normal range of motion. Neck supple. No JVD present. No tracheal deviation present. No thyromegaly present.  Cardiovascular: Normal rate, regular rhythm, normal heart sounds and intact distal pulses.  Exam reveals no gallop and no friction rub.   No murmur heard. Pulmonary/Chest: Effort normal and breath sounds normal. No respiratory distress. She has no wheezes. She has no rales. She exhibits no tenderness.  Scattered squeaks  Abdominal: Soft. Bowel sounds are normal. She exhibits no distension and no mass. There is no tenderness. There is no rebound and no guarding.  Musculoskeletal: Normal range of motion. She exhibits no edema or tenderness.  Lymphadenopathy:    She has no cervical adenopathy.  Neurological: She is alert and oriented to person, place, and time. She has normal reflexes. No cranial nerve deficit. She exhibits normal muscle tone. Coordination normal.  Skin: Skin is warm and dry. No rash noted. She is not diaphoretic. No erythema. No pallor.  Psychiatric: She has a normal mood and affect. Her behavior is normal. Judgment and thought content normal.  Vitals reviewed.   Vitals:   02/20/16 1405  BP: (!) 158/72  Pulse: 84  Temp: 98.3 F (36.8 C)  TempSrc: Oral  SpO2: 98%  Weight: 193 lb 12.8 oz (87.9 kg)  Height: 5\' 6"  (1.676 m)        Assessment:       ICD-9-CM ICD-10-CM   1. ILD (interstitial lung disease) ([place]) 515 J84.9   2. NSIP (nonspecific interstitial pneumonia) ([place]) 516.8 J84.89   3. Pulmonary air trapping 786.9 R09.89   4. Acute bronchitis, unspecified organism 466.0 J20.9  Plan:     1. ILD (interstitial lung disease) ([place]) 2. NSIP (nonspecific interstitial pneumonia) ([name]) - stable disease since 2015  - discussed biopsy but given stability, risk and non-IPF pattern will continue to watch - no need for oxygen  3. Pulmonary air trapping - too bad spirivan and symbicort did not work  well for you - re-continue symbicort  4. Acute bronchitis Z pak  Followup Flu shot in fall 6 months or sooner if needed  Dr. Brand Males, M.D., [facility].C.P Pulmonary and Critical Care Medicine Staff Physician [facility] Pager: [phone], If no answer or between  15:00h - 7:00h: call [phone]  02/20/2016 2:42 PM

## 2016-02-20 NOTE — Addendum Note (Signed)
Addended by: Collier Salina on: 02/20/2016 02:52 PM   Modules accepted: Orders

## 2016-02-20 NOTE — Telephone Encounter (Signed)
Pt had OV with MR today 02/20/16 and ONO results were reviewed with pt. Nothing further needed at this time.

## 2016-02-20 NOTE — Telephone Encounter (Signed)
This has been received and placed in MRs look-ats. Will update chart with results per MRs instructions.

## 2016-02-20 NOTE — Patient Instructions (Addendum)
1. ILD (interstitial lung disease) ([place]) 2. NSIP (nonspecific interstitial pneumonia) ([place]) - stable disease since 2015  - discussed biopsy but given stability, risk and non-IPF pattern will continue to watch - no need for oxygen  3. Pulmonary air trapping - too bad spirivan and symbicort did not work well for you - re-continue symbicort  4. Acute bronchitis Z pak  Followup Flu shot in fall 6 months or sooner if needed

## 2016-02-21 ENCOUNTER — Encounter: Payer: Self-pay | Admitting: Internal Medicine

## 2016-02-21 ENCOUNTER — Ambulatory Visit: Payer: Medicare Other | Admitting: Physician Assistant

## 2016-02-22 ENCOUNTER — Telehealth: Payer: Self-pay | Admitting: *Deleted

## 2016-02-22 NOTE — Telephone Encounter (Signed)
Pt called to inform [facility] that she was prescribed a zpak 250mg  for 6 days, she started on Tuesday, 02/21/16 & she will complete on Saturday, 02/25/16.  Also, she has cancelled her upcoming epidural spinal injection that was scheduled for 02/23/16 because she forget to stop her Coumadin.  The pt stated she will notify [facility] once her spinal injection is rescheduled.  The pt will be seen on 02/28/16 & confirmed over the phone with pt.

## 2016-02-28 ENCOUNTER — Ambulatory Visit (INDEPENDENT_AMBULATORY_CARE_PROVIDER_SITE_OTHER): Payer: Medicare Other | Admitting: Pharmacist

## 2016-02-28 DIAGNOSIS — Z5181 Encounter for therapeutic drug level monitoring: Secondary | ICD-10-CM | POA: Diagnosis not present

## 2016-02-28 DIAGNOSIS — I4891 Unspecified atrial fibrillation: Secondary | ICD-10-CM | POA: Diagnosis not present

## 2016-02-28 DIAGNOSIS — I482 Chronic atrial fibrillation, unspecified: Secondary | ICD-10-CM

## 2016-02-28 LAB — POCT INR: INR: 1.9

## 2016-03-06 NOTE — Progress Notes (Signed)
Cardiology Office Note Date:  03/07/2016  Patient ID:  Stephanie, Cortez 1933-08-26, MRN [id number] PCP:  Stephanie Naas, MD  Cardiologist:  Dr. Graceann Congress, MD (Gastroenterology) Stephanie Males, MD (Pulmonary Disease) Stephanie Shin, MD (Endocrinology) Stephanie Levine, MD (Neurosurgery) Stephanie Cooks, MD (Orthopedic Surgery) Stephanie Nunnery, MD (Otolaryngology)    Chief Complaint: routine visit  History of Present Illness: Stephanie Cortez is a 82 y.o. female with history of  Permanent AFib (historically failed multiple DCCV and AAD), bronchiectasis, COPD, HTN, CBP, anhalasia, IBS/colitis, Cortez to the office today to be seen for Dr. Caryl Cortez.  She was last seen by Dr. Caryl Cortez April 2017, his note makes mention in Dec 2014 in  [place]  because of chest pain elevated blood pressure and atrial fibrillation with a rapid rate, she underwent a catheterization which was nonobstructive apparently. An echocardiogram was also normal except for left atrial enlargement.   At her visit with Dr. Caryl Cortez she was doing well at that time noting mild orthostasis, no changes were made.  She Cortez today concerned about her BP being hisgh, brings home readings 150-170/70-90 range.  She feels "OK", no new or particular symptoms, states that with as many medical problems as she has she just tends not to feel perfect.  She denies any anginal complaints, no palpitations, her breathing at it's baseline, no near syncope or syncope.   Past Medical History:  Diagnosis Date  . Anemia    - Hgb 9.7gm% on 07/13/2008 in [place] -  Hgg 129gm% wiht normal irone levsl and ferritin 10/27/2008 in [place] Recurrent otitis/sinusitis  . Anxiety    chronic BZ prn  . Atrial fibrillation (HCC)    chronic anticoag  . Bronchiectasis    >PFT 07/13/2008 in [place] 1.9L/76%, FVC 2.45L/74, Ratio 79, TLC 121%, DLCO 64%  AE BRonchiectasis - Dec 2010.New Rx:  outpatient - Feb 2011 - Rx outpatient  . CHF (congestive heart  failure) ([place])   . COPD (chronic obstructive pulmonary disease) (HCC)    bronchiectasis  . Cricopharyngeal achalasia   . Diverticulosis   . Dyslipidemia   . Eczema   . GERD with stricture   . Glaucoma   . H. pylori infection   . Hypertension   . Hyponatremia    chronic, s/p endo eval 06/2012  . Hypothyroid   . IBS (irritable bowel syndrome)   . Lumbar disc disease   . Segmental colitis [facility])     Past Surgical History:  Procedure Laterality Date  . BREAST SURGERY     br bx  . CARDIAC CATHETERIZATION    . CATARACT EXTRACTION     both  . COLONOSCOPY    . ESOPHAGOSCOPY W/ BOTOX INJECTION  12/11/2011   Procedure: ESOPHAGOSCOPY WITH BOTOX INJECTION;  Surgeon: Stephanie Nunnery, MD;  Location: [facility];  Service: ENT;  Laterality: N/A;  esophogoscopy with dilation, botox injection  . FOOT SURGERY  03/14/2011   gastroc slide-rt  . TOTAL ABDOMINAL HYSTERECTOMY      Current Outpatient Prescriptions  Medication Sig Dispense Refill  . BENICAR 40 MG tablet TAKE 1 TABLET BY MOUTH DAILY 90 tablet 1  . budesonide-formoterol (SYMBICORT) 80-4.5 MCG/ACT inhaler INHALE 2 PUFFS INTO THE LUNGS TWICE DAILY 10.2 g 6  . cetirizine (ZYRTEC) 10 MG tablet Take 10 mg by mouth daily as needed.     . Cholecalciferol (VITAMIN D PO) Take 1 tablet by mouth daily.    . Cyanocobalamin (VITAMIN B-12 PO)  Take 1 tablet by mouth daily.    [name] Kitchen diltiazem (CARDIZEM CD) 180 MG 24 hr capsule Take 1 capsule (180 mg total) by mouth daily. 90 capsule 1  . FLUoxetine (PROZAC) 10 MG capsule TAKE ONE CAPSULE BY MOUTH EVERY DAY 90 capsule 0  . furosemide (LASIX) 20 MG tablet TAKE 1 TABLET(20 MG) BY MOUTH DAILY AS NEEDED 30 tablet 11  . guaiFENesin (MUCINEX) 600 MG 12 hr tablet Take 1 tablet (600 mg total) by mouth 2 (two) times daily. 60 tablet 1  . levothyroxine (SYNTHROID, LEVOTHROID) 50 MCG tablet Take 1 tablet (50 mcg total) by mouth daily. 90 tablet 3  . MAGNESIUM PO Take 1 tablet by mouth daily.       . mesalamine (LIALDA) 1.2 g EC tablet Take 2 tablets (2.4 g total) by mouth daily. 60 tablet 5  . nebivolol (BYSTOLIC) 5 MG tablet Take 1 tablet (5 mg total) by mouth daily. 90 tablet 3  . omeprazole (PRILOSEC) 20 MG capsule TAKE 1 CAPSULE(20 MG) BY MOUTH DAILY 90 capsule 4  . OXYGEN Inhale into the lungs. 2 Liters only at night    . Potassium (POTASSIMIN) 75 MG TABS Take 1 tablet by mouth daily. 1 tab po qd    . Respiratory Therapy Supplies (FLUTTER) DEVI Use as directed 1 each 0  . selenium 50 MCG TABS Take 50 mcg by mouth daily.    . sodium chloride (OCEAN) 0.65 % SOLN nasal spray Place 1 spray into both nostrils as needed for congestion.    . Tiotropium Bromide Monohydrate (SPIRIVA RESPIMAT) 2.5 MCG/ACT AERS Inhale 2 puffs into the lungs daily. 2 Inhaler 0  . warfarin (COUMADIN) 2.5 MG tablet Take 1/2-1 tablet by mouth daily as directed by [facility] Clinic 90 tablet 1  . Zinc 22.5 MG TABS Take by mouth daily.     No current facility-administered medications for this visit.     Allergies:   Doxycycline; Flagyl [metronidazole]; Moxifloxacin; Pantoprazole sodium; and Penicillins   Social History:  The patient  reports that she has never smoked. She has never used smokeless tobacco. She reports that she does not drink alcohol or use drugs.   Family History:  The patient's family history includes Cancer in her father; Colon polyps in her sister; Emphysema in her brother; Hypertension in her mother; Kidney disease in her sister; Other in her father; Pancreatic cancer in her sister; Stroke in her mother.  ROS:  Please see the history of present illness.  All other systems are reviewed and otherwise negative.   PHYSICAL EXAM:  VS:  BP (!) 146/82   Pulse 84   Ht 5\' 6"  (1.676 m)   Wt 186 lb (84.4 kg)   BMI 30.02 kg/m  BMI: Body mass index is 30.02 kg/m. Well nourished, well developed, in no acute distress  HEENT: normocephalic, atraumatic  Neck: no JVD, carotid bruits or masses Cardiac:   IRRR; no significant murmurs, no rubs, or gallops Lungs:  clear to auscultation bilaterally, no wheezing, rhonchi or rales  Abd: soft, nontender MS: no deformity or atrophy Ext: no edema  Skin: warm and dry, no rash Neuro:  No gross deficits appreciated Psych: euthymic mood, full affect     EKG:  Done 10/11/15 showed AFib, 94bpm, RBBB  07/01/13: LHC (done in [place]) 1. Normal coronaries 2. Normal LV size and function, EF 60%  06/30/13: TTE LVEF 123456 Diastolic dysfunction LA mildly dilated (60mm) No significant VHD described RV 25-18mmhg  Recent Labs: No results found  for requested labs within last 8760 hours.  No results found for requested labs within last 8760 hours.   CrCl cannot be calculated (Patient's most recent lab result is older than the maximum 21 days allowed.).   Wt Readings from Last 3 Encounters:  03/07/16 186 lb (84.4 kg)  02/20/16 193 lb 12.8 oz (87.9 kg)  01/04/16 195 lb (88.5 kg)     Other studies reviewed: Additional studies/records reviewed today include: summarized above, and Dr. Olin Pia notes  ASSESSMENT AND PLAN:  1. Permanent AFib     CHA2DS2Vasc is at least 4, on warfarin     Follows with coumadin clinic  2. HTN     Last few visits have been elevated, a recheck here today as well 158/88     Discussed increasing her Bystolic though she hesitated concerned about BB and pulmonary side effects (does nt report any currently at the low dose)     Discussed increasing her diltiazem which she is agreeable to, but mentions when on higher doses of 300mg  she felt weak  3. Ortostatic hypotension     Denies any symptoms since her last visit  4. Hx of HFpEF     Exam appears euvolemic     Weight is down from prior visit   Disposition: F/u with with an RN/BP visit in 2 months, Dr. Caryl Cortez in 91mo sooner if needed.  Current medicines are reviewed at length with the patient today.  The patient did not have any concerns regarding  medicines.  Haywood Lasso, PA-C 03/07/2016 5:13 PM     [place] [address] [phone] (office)  [phone] (fax)

## 2016-03-07 ENCOUNTER — Ambulatory Visit (INDEPENDENT_AMBULATORY_CARE_PROVIDER_SITE_OTHER): Payer: Medicare Other | Admitting: Physician Assistant

## 2016-03-07 VITALS — BP 146/82 | HR 84 | Ht 66.0 in | Wt 186.0 lb

## 2016-03-07 DIAGNOSIS — I10 Essential (primary) hypertension: Secondary | ICD-10-CM | POA: Diagnosis not present

## 2016-03-07 DIAGNOSIS — I482 Chronic atrial fibrillation: Secondary | ICD-10-CM | POA: Diagnosis not present

## 2016-03-07 DIAGNOSIS — I4821 Permanent atrial fibrillation: Secondary | ICD-10-CM

## 2016-03-07 DIAGNOSIS — I5032 Chronic diastolic (congestive) heart failure: Secondary | ICD-10-CM

## 2016-03-07 MED ORDER — DILTIAZEM HCL ER COATED BEADS 180 MG PO CP24: 180.0000 mg | ORAL_CAPSULE | Freq: Every day | ORAL | 1 refills | Status: DC

## 2016-03-07 NOTE — Patient Instructions (Signed)
Medication Instructions:   START TAKING DILTIAZEM 180 MG ONCE  A DAY   If you need a refill on your cardiac medications before your next appointment, please call your pharmacy.  Labwork: NONE ORDER TODAY    Testing/Procedures: NONE ORDER TODAY    Follow-Up:  IN 8 WEEKS WITH A PHARM D OR NURSE VISIT FOR BLOOD PRESSURE CHECK AND MEDICATION CHANGES   Your physician wants you to follow-up in:  IN  [place] will receive a reminder letter in the mail two months in advance. If you don't receive a letter, please call our office to schedule the follow-up appointment.      Any Other Special Instructions Will Be Listed Below (If Applicable).

## 2016-03-27 ENCOUNTER — Ambulatory Visit (INDEPENDENT_AMBULATORY_CARE_PROVIDER_SITE_OTHER): Payer: Medicare Other | Admitting: *Deleted

## 2016-03-27 DIAGNOSIS — I4891 Unspecified atrial fibrillation: Secondary | ICD-10-CM | POA: Diagnosis not present

## 2016-03-27 DIAGNOSIS — I482 Chronic atrial fibrillation, unspecified: Secondary | ICD-10-CM

## 2016-03-27 DIAGNOSIS — Z5181 Encounter for therapeutic drug level monitoring: Secondary | ICD-10-CM

## 2016-03-27 LAB — POCT INR: INR: 2.8

## 2016-04-06 ENCOUNTER — Other Ambulatory Visit: Payer: Self-pay | Admitting: Internal Medicine

## 2016-04-09 DIAGNOSIS — J310 Chronic rhinitis: Secondary | ICD-10-CM | POA: Diagnosis not present

## 2016-04-12 ENCOUNTER — Ambulatory Visit (INDEPENDENT_AMBULATORY_CARE_PROVIDER_SITE_OTHER): Payer: Medicare Other | Admitting: *Deleted

## 2016-04-12 DIAGNOSIS — I482 Chronic atrial fibrillation, unspecified: Secondary | ICD-10-CM

## 2016-04-12 DIAGNOSIS — Z5181 Encounter for therapeutic drug level monitoring: Secondary | ICD-10-CM

## 2016-04-12 DIAGNOSIS — I4891 Unspecified atrial fibrillation: Secondary | ICD-10-CM

## 2016-04-12 LAB — POCT INR: INR: 1.1

## 2016-04-17 DIAGNOSIS — E782 Mixed hyperlipidemia: Secondary | ICD-10-CM | POA: Diagnosis not present

## 2016-04-17 DIAGNOSIS — Z23 Encounter for immunization: Secondary | ICD-10-CM | POA: Diagnosis not present

## 2016-04-17 DIAGNOSIS — I480 Paroxysmal atrial fibrillation: Secondary | ICD-10-CM | POA: Diagnosis not present

## 2016-04-17 DIAGNOSIS — F419 Anxiety disorder, unspecified: Secondary | ICD-10-CM | POA: Diagnosis not present

## 2016-04-17 DIAGNOSIS — I10 Essential (primary) hypertension: Secondary | ICD-10-CM | POA: Diagnosis not present

## 2016-04-24 ENCOUNTER — Ambulatory Visit (INDEPENDENT_AMBULATORY_CARE_PROVIDER_SITE_OTHER): Payer: Medicare Other | Admitting: *Deleted

## 2016-04-24 DIAGNOSIS — I482 Chronic atrial fibrillation, unspecified: Secondary | ICD-10-CM

## 2016-04-24 DIAGNOSIS — Z5181 Encounter for therapeutic drug level monitoring: Secondary | ICD-10-CM

## 2016-04-24 DIAGNOSIS — I4891 Unspecified atrial fibrillation: Secondary | ICD-10-CM

## 2016-04-24 LAB — POCT INR: INR: 2.3

## 2016-05-02 ENCOUNTER — Ambulatory Visit: Payer: Medicare Other

## 2016-05-07 ENCOUNTER — Telehealth: Payer: Self-pay | Admitting: Internal Medicine

## 2016-05-07 MED ORDER — PREDNISONE 10 MG PO TABS: ORAL_TABLET | ORAL | 0 refills | Status: DC

## 2016-05-07 NOTE — Telephone Encounter (Signed)
Called and spoke to pt. Pt c/o increase in SOB, prod cough with clear mucus, sinus congestion with green mucus, and chest discomfort d/t chest congestion x 4 days. Pt denies f/c/s. Pt states she is taking flonase, saline nasal rinse, and Mucinex D. Dr. Chase Caller is unavailable, will send to DOD.   Dr. Vaughan Browner please advise.

## 2016-05-07 NOTE — Telephone Encounter (Signed)
Called and spoke to pt. Informed her of the recs per PM. Rx sent to preferred pharmacy. Pt verbalized understanding and denied any further questions or concerns at this time.

## 2016-05-07 NOTE — Telephone Encounter (Signed)
Pt returning call.Stephanie Cortez ° °

## 2016-05-07 NOTE — Telephone Encounter (Signed)
Prednisone at 40 mg. Reduce dose by 10 mg every 3 days. If she is not improving then have her call back for CXR

## 2016-05-07 NOTE — Telephone Encounter (Signed)
LMTCB. Will await call back. 

## 2016-05-08 ENCOUNTER — Ambulatory Visit: Payer: Medicare Other

## 2016-05-14 ENCOUNTER — Ambulatory Visit (INDEPENDENT_AMBULATORY_CARE_PROVIDER_SITE_OTHER): Payer: Medicare Other | Admitting: Pharmacist

## 2016-05-14 VITALS — BP 170/84 | HR 82

## 2016-05-14 DIAGNOSIS — I10 Essential (primary) hypertension: Secondary | ICD-10-CM | POA: Diagnosis not present

## 2016-05-14 DIAGNOSIS — I4891 Unspecified atrial fibrillation: Secondary | ICD-10-CM | POA: Diagnosis not present

## 2016-05-14 DIAGNOSIS — I482 Chronic atrial fibrillation, unspecified: Secondary | ICD-10-CM

## 2016-05-14 DIAGNOSIS — Z5181 Encounter for therapeutic drug level monitoring: Secondary | ICD-10-CM | POA: Diagnosis not present

## 2016-05-14 LAB — POCT INR: INR: 3.6

## 2016-05-14 NOTE — Progress Notes (Signed)
Patient ID: Stephanie Cortez                 DOB: 1933/07/14                      MRN: [id number]     HPI: Stephanie Cortez is a 82 y.o. female a patient of Dr. Caryl Comes referred  to HTN clinic. PMH permanent AFib (historically failed multiple DCCV and AAD), HTN, CHF (HFpEF), edema, hypothyroidism, and COPD. Patient's BP at her last clinic visit on 09/06 was 146/82 on nebivolol 5 mg daily, furosemide 20 mg PRN, diltiazem 180 Q24hr, and olmesartan 40 mg daily. Since then, pt saw Dr Wynonia Lawman once and he increased her Bystolic to 10mg  daily. Pt has been hesitant to increase diltiazem dose due to fatigue with higher doses.  Pt has been on prednisone taper recently for a pulmonary infection. She has approximately 4-5 days of therapy left. She also reports having pain in back and hips recently. She states she is trying to improve her dietary habits and wishes to lose weight to help with BP control.   Current HTN meds: nebivolol 10 mg daily (increased by Dr. Wynonia Lawman ~3 weeks ago), furosemide 20 mg PRN, diltiazem 180mg  Q24hr, olmesartan 40 mg daily Previously tried: N/A BP goal: <150/90 mmHg  Family History: HTN, Stroke (mother), Emphysema (brother), Lung cancer (father)  Social History: Never smoker, denies alcohol and illicit drug use  Diet: Patient endorses stress eating and nighttime snacking. She wishes to lose weight and has tried several times, but gets off- track easily. She reports reading labels, but she still has trouble with limiting foods with salt.    Exercise: Does not exercise, has trouble with foot and back pain.   Wt Readings from Last 3 Encounters:  03/07/16 186 lb (84.4 kg)  02/20/16 193 lb 12.8 oz (87.9 kg)  01/04/16 195 lb (88.5 kg)   BP Readings from Last 3 Encounters:  03/07/16 (!) 146/82  02/20/16 (!) 158/72  01/04/16 (!) 170/90   Pulse Readings from Last 3 Encounters:  03/07/16 84  02/20/16 84  01/04/16 82    Renal function: CrCl cannot be calculated (Patient's most  recent lab result is older than the maximum 21 days allowed.).  Past Medical History:  Diagnosis Date  . Anemia    - Hgb 9.7gm% on 07/13/2008 in [place] -  Hgg 129gm% wiht normal irone levsl and ferritin 10/27/2008 in [place] Recurrent otitis/sinusitis  . Anxiety    chronic BZ prn  . Atrial fibrillation (HCC)    chronic anticoag  . Bronchiectasis    >PFT 07/13/2008 in [place] 1.9L/76%, FVC 2.45L/74, Ratio 79, TLC 121%, DLCO 64%  AE BRonchiectasis - Dec 2010.New Rx:  outpatient - Feb 2011 - Rx outpatient  . CHF (congestive heart failure) ([place])   . COPD (chronic obstructive pulmonary disease) (HCC)    bronchiectasis  . Cricopharyngeal achalasia   . Diverticulosis   . Dyslipidemia   . Eczema   . GERD with stricture   . Glaucoma   . H. pylori infection   . Hypertension   . Hyponatremia    chronic, s/p endo eval 06/2012  . Hypothyroid   . IBS (irritable bowel syndrome)   . Lumbar disc disease   . Segmental colitis [facility])     Current Outpatient Prescriptions on File Prior to Visit  Medication Sig Dispense Refill  . BENICAR 40 MG tablet TAKE 1 TABLET BY MOUTH DAILY 90 tablet 1  .  budesonide-formoterol (SYMBICORT) 80-4.5 MCG/ACT inhaler INHALE 2 PUFFS INTO THE LUNGS TWICE DAILY 10.2 g 6  . cetirizine (ZYRTEC) 10 MG tablet Take 10 mg by mouth daily as needed.     . Cholecalciferol (VITAMIN D PO) Take 1 tablet by mouth daily.    . Cyanocobalamin (VITAMIN B-12 PO) Take 1 tablet by mouth daily.    [name] Kitchen diltiazem (CARDIZEM CD) 180 MG 24 hr capsule Take 1 capsule (180 mg total) by mouth daily. 90 capsule 1  . FLUoxetine (PROZAC) 10 MG capsule TAKE ONE CAPSULE BY MOUTH EVERY DAY 90 capsule 0  . furosemide (LASIX) 20 MG tablet TAKE 1 TABLET(20 MG) BY MOUTH DAILY AS NEEDED 30 tablet 11  . guaiFENesin (MUCINEX) 600 MG 12 hr tablet Take 1 tablet (600 mg total) by mouth 2 (two) times daily. 60 tablet 1  . levothyroxine (SYNTHROID, LEVOTHROID) 50 MCG tablet Take 1 tablet (50 mcg total) by mouth daily.  90 tablet 3  . MAGNESIUM PO Take 1 tablet by mouth daily.     . mesalamine (LIALDA) 1.2 g EC tablet TAKE 2 TABLETS(2.4 GRAMS) BY MOUTH DAILY 60 tablet 1  . nebivolol (BYSTOLIC) 5 MG tablet Take 1 tablet (5 mg total) by mouth daily. 90 tablet 3  . omeprazole (PRILOSEC) 20 MG capsule TAKE 1 CAPSULE(20 MG) BY MOUTH DAILY 90 capsule 4  . OXYGEN Inhale into the lungs. 2 Liters only at night    . Potassium (POTASSIMIN) 75 MG TABS Take 1 tablet by mouth daily. 1 tab po qd    . predniSONE (DELTASONE) 10 MG tablet Take 40mg  for 3 days, then 30mg  for 3 days, 20mg  for 3 days, 10mg  for 3 days, then stop 30 tablet 0  . Respiratory Therapy Supplies (FLUTTER) DEVI Use as directed 1 each 0  . selenium 50 MCG TABS Take 50 mcg by mouth daily.    . sodium chloride (OCEAN) 0.65 % SOLN nasal spray Place 1 spray into both nostrils as needed for congestion.    . Tiotropium Bromide Monohydrate (SPIRIVA RESPIMAT) 2.5 MCG/ACT AERS Inhale 2 puffs into the lungs daily. 2 Inhaler 0  . warfarin (COUMADIN) 2.5 MG tablet Take 1/2-1 tablet by mouth daily as directed by Coumadin Clinic 90 tablet 1  . Zinc 22.5 MG TABS Take by mouth daily.     No current facility-administered medications on file prior to visit.     Allergies  Allergen Reactions  . Doxycycline     Rash on face and neck  . Flagyl [Metronidazole] Nausea And Vomiting  . Moxifloxacin Other (See Comments)    REACTION: weakness  . Pantoprazole Sodium Other (See Comments)    REACTION: rash  . Penicillins Other (See Comments)    REACTION: rash    Assessment/Plan:  1- Hypertension - Pt's BP today was 170/84 which is above the goal of <150/90 mmHg. This BP is noticeably higher than previous readings.  She is currently taking a corticosteroid for a pulmonary infection and is experiencing baseline pain which can both increase her BP. Discussed the option of adding spironolactone vs hydralazine vs lifestyle modifications. She is determined to make lifestyle  improvements to help control her sodium intake and lose weight. Advised her to limit caffeine intake, chose leaner meat options, read labels, and avoid adding salt to foods. She wishes to hold off starting a new therapy for now.   Follow-up on BP in 3 weeks at next Coumadin visit at which time will assess the need for medication changes. Will  need baseline BMET if she starts spironolactone.  Patient seen in clinic by Stephanie Cortez, P4 pharmacy student.   Stephanie Cortez, PharmD, [place] [id number], [place], [place] [address] Phone: [phone]; Fax: [phone] 05/14/2016 4:55 PM

## 2016-06-04 ENCOUNTER — Ambulatory Visit (INDEPENDENT_AMBULATORY_CARE_PROVIDER_SITE_OTHER): Payer: Medicare Other | Admitting: Pharmacist

## 2016-06-04 VITALS — BP 152/84 | HR 80 | Wt 193.0 lb

## 2016-06-04 DIAGNOSIS — I4891 Unspecified atrial fibrillation: Secondary | ICD-10-CM

## 2016-06-04 DIAGNOSIS — I10 Essential (primary) hypertension: Secondary | ICD-10-CM

## 2016-06-04 DIAGNOSIS — I482 Chronic atrial fibrillation, unspecified: Secondary | ICD-10-CM

## 2016-06-04 DIAGNOSIS — Z5181 Encounter for therapeutic drug level monitoring: Secondary | ICD-10-CM | POA: Diagnosis not present

## 2016-06-04 LAB — POCT INR: INR: 2.5

## 2016-06-04 NOTE — Progress Notes (Signed)
Patient ID: Stephanie Cortez                 DOB: [birth year]                      MRN: [id number]     HPI: Stephanie Cortez is a 82 y.o. female a patient of Dr. Caryl Comes referred  to HTN clinic. PMH permanent AFib (historically failed multiple DCCV and AAD), HTN, CHF (HFpEF), edema, hypothyroidism, and COPD. Patient's BP at her last clinic visit was 170/84. Of note, she was in pain at baseline and was on steroids for a pulmonary infection which can increase BP. Pt did not want to start new medications at that time and instead wanted to focus on lifestyle changes. She has been hesitant to increase diltiazem dose due to fatigue with higher doses. Presents today for follow up.  Pt feels like her BP has improved although she has not been checking it at home. She has discontinued prednisone since the last visit. However, her back and hip pain is much worse now and is starting to affect her quality of life. She is planning to see a specialist.  She has still been trying to improve her diet and lose weight. 186 lbs in Sept but has increased to 193 lbs today. She has been cutting back on caffeine and salt. Still snacking in the evening.  Current HTN meds: nebivolol 10 mg daily (increased by Dr. Wynonia Lawman), furosemide 20 mg PRN, diltiazem 180mg  daily, olmesartan 40 mg daily Previously tried: N/A BP goal: <150/90 mmHg given age  Family History: HTN, Stroke (mother), Emphysema (brother), Lung cancer (father)  Social History: Never smoker, denies alcohol and illicit drug use  Diet: Patient endorses stress eating and nighttime snacking. She wishes to lose weight and has tried several times, but gets off- track easily. She reports reading labels, but she still has trouble with limiting foods with salt.    Exercise: Does not exercise, has trouble with foot and back pain.   Wt Readings from Last 3 Encounters:  03/07/16 186 lb (84.4 kg)  02/20/16 193 lb 12.8 oz (87.9 kg)  01/04/16 195 lb (88.5 kg)   BP  Readings from Last 3 Encounters:  05/14/16 (!) 170/84  03/07/16 (!) 146/82  02/20/16 (!) 158/72   Pulse Readings from Last 3 Encounters:  05/14/16 82  03/07/16 84  02/20/16 84    Renal function: CrCl cannot be calculated (Patient's most recent lab result is older than the maximum 21 days allowed.).  Past Medical History:  Diagnosis Date  . Anemia    - Hgb 9.7gm% on 07/13/2008 in [place] -  Hgg 129gm% wiht normal irone levsl and ferritin 10/27/2008 in [place] Recurrent otitis/sinusitis  . Anxiety    chronic BZ prn  . Atrial fibrillation (HCC)    chronic anticoag  . Bronchiectasis    >PFT 07/13/2008 in [place] 1.9L/76%, FVC 2.45L/74, Ratio 79, TLC 121%, DLCO 64%  AE BRonchiectasis - Dec 2010.New Rx:  outpatient - Feb 2011 - Rx outpatient  . CHF (congestive heart failure) ([place])   . COPD (chronic obstructive pulmonary disease) (HCC)    bronchiectasis  . Cricopharyngeal achalasia   . Diverticulosis   . Dyslipidemia   . Eczema   . GERD with stricture   . Glaucoma   . H. pylori infection   . Hypertension   . Hyponatremia    chronic, s/p endo eval 06/2012  . Hypothyroid   . IBS (  irritable bowel syndrome)   . Lumbar disc disease   . Segmental colitis [facility])     Current Outpatient Prescriptions on File Prior to Visit  Medication Sig Dispense Refill  . BENICAR 40 MG tablet TAKE 1 TABLET BY MOUTH DAILY 90 tablet 1  . budesonide-formoterol (SYMBICORT) 80-4.5 MCG/ACT inhaler INHALE 2 PUFFS INTO THE LUNGS TWICE DAILY 10.2 g 6  . cetirizine (ZYRTEC) 10 MG tablet Take 10 mg by mouth daily as needed.     . Cholecalciferol (VITAMIN D PO) Take 1 tablet by mouth daily.    . Cyanocobalamin (VITAMIN B-12 PO) Take 1 tablet by mouth daily.    [name] Kitchen diltiazem (CARDIZEM CD) 180 MG 24 hr capsule Take 1 capsule (180 mg total) by mouth daily. 90 capsule 1  . FLUoxetine (PROZAC) 10 MG capsule TAKE ONE CAPSULE BY MOUTH EVERY DAY 90 capsule 0  . furosemide (LASIX) 20 MG tablet TAKE 1 TABLET(20 MG) BY  MOUTH DAILY AS NEEDED 30 tablet 11  . guaiFENesin (MUCINEX) 600 MG 12 hr tablet Take 1 tablet (600 mg total) by mouth 2 (two) times daily. 60 tablet 1  . levothyroxine (SYNTHROID, LEVOTHROID) 50 MCG tablet Take 1 tablet (50 mcg total) by mouth daily. 90 tablet 3  . MAGNESIUM PO Take 1 tablet by mouth daily.     . mesalamine (LIALDA) 1.2 g EC tablet TAKE 2 TABLETS(2.4 GRAMS) BY MOUTH DAILY 60 tablet 1  . nebivolol (BYSTOLIC) 10 MG tablet Take 10 mg by mouth daily.    [name] Kitchen omeprazole (PRILOSEC) 20 MG capsule TAKE 1 CAPSULE(20 MG) BY MOUTH DAILY 90 capsule 4  . OXYGEN Inhale into the lungs. 2 Liters only at night    . Potassium (POTASSIMIN) 75 MG TABS Take 1 tablet by mouth daily. 1 tab po qd    . predniSONE (DELTASONE) 10 MG tablet Take 40mg  for 3 days, then 30mg  for 3 days, 20mg  for 3 days, 10mg  for 3 days, then stop 30 tablet 0  . Respiratory Therapy Supplies (FLUTTER) DEVI Use as directed 1 each 0  . selenium 50 MCG TABS Take 50 mcg by mouth daily.    . sodium chloride (OCEAN) 0.65 % SOLN nasal spray Place 1 spray into both nostrils as needed for congestion.    . Tiotropium Bromide Monohydrate (SPIRIVA RESPIMAT) 2.5 MCG/ACT AERS Inhale 2 puffs into the lungs daily. 2 Inhaler 0  . warfarin (COUMADIN) 2.5 MG tablet Take 1/2-1 tablet by mouth daily as directed by Coumadin Clinic 90 tablet 1  . Zinc 22.5 MG TABS Take by mouth daily.     No current facility-administered medications on file prior to visit.     Allergies  Allergen Reactions  . Doxycycline     Rash on face and neck  . Flagyl [Metronidazole] Nausea And Vomiting  . Moxifloxacin Other (See Comments)    REACTION: weakness  . Pantoprazole Sodium Other (See Comments)    REACTION: rash  . Penicillins Other (See Comments)    REACTION: rash     Assessment/Plan:  1. Hypertension - BP improved significantly since last visit to 152/84. Given pt's age, will try to keep BP< 150/67mmHg.  Pt still prefers to make dietary changes and work  on losing weight rather than adding a new medication like hydralazine. Will follow up in 1 month at next Coumadin check.   Takoda Siedlecki E. Micha Dosanjh, PharmD, [place] [id number] [address], [place], [place] [address] Phone: [phone]; Fax: [phone] 06/04/2016 4:31 PM

## 2016-06-13 DIAGNOSIS — I480 Paroxysmal atrial fibrillation: Secondary | ICD-10-CM | POA: Diagnosis not present

## 2016-06-13 DIAGNOSIS — Z13820 Encounter for screening for osteoporosis: Secondary | ICD-10-CM | POA: Diagnosis not present

## 2016-06-13 DIAGNOSIS — E785 Hyperlipidemia, unspecified: Secondary | ICD-10-CM | POA: Diagnosis not present

## 2016-06-13 DIAGNOSIS — I10 Essential (primary) hypertension: Secondary | ICD-10-CM | POA: Diagnosis not present

## 2016-06-13 DIAGNOSIS — Z1389 Encounter for screening for other disorder: Secondary | ICD-10-CM | POA: Diagnosis not present

## 2016-06-13 DIAGNOSIS — J849 Interstitial pulmonary disease, unspecified: Secondary | ICD-10-CM | POA: Diagnosis not present

## 2016-06-13 DIAGNOSIS — Z23 Encounter for immunization: Secondary | ICD-10-CM | POA: Diagnosis not present

## 2016-06-13 DIAGNOSIS — M545 Low back pain: Secondary | ICD-10-CM | POA: Diagnosis not present

## 2016-06-13 DIAGNOSIS — Z0000 Encounter for general adult medical examination without abnormal findings: Secondary | ICD-10-CM | POA: Diagnosis not present

## 2016-06-13 DIAGNOSIS — F419 Anxiety disorder, unspecified: Secondary | ICD-10-CM | POA: Diagnosis not present

## 2016-06-13 DIAGNOSIS — E039 Hypothyroidism, unspecified: Secondary | ICD-10-CM | POA: Diagnosis not present

## 2016-06-20 IMAGING — CT CT CHEST HIGH RESOLUTION W/O CM
3 of 7 series · 13 of 36 positions shown, 14 images · non-contrast
Comparison: Chest CT 11/24/2013.

CLINICAL DATA: 81-year-old female with shortness of breath. History
of interstitial lung disease and bronchiectasis. History of CHF and
COPD.

EXAM:
CT CHEST WITHOUT CONTRAST
TECHNIQUE: Multidetector CT imaging of the chest was performed following the
standard protocol without intravenous contrast. High resolution
imaging of the lungs, as well as inspiratory and expiratory imaging,
was performed.

[Series 5: lung · axial · 0.62mm/px · z∈[-274,-64]mm · 7 of 56 slices shown]
[im 7/56  lung]
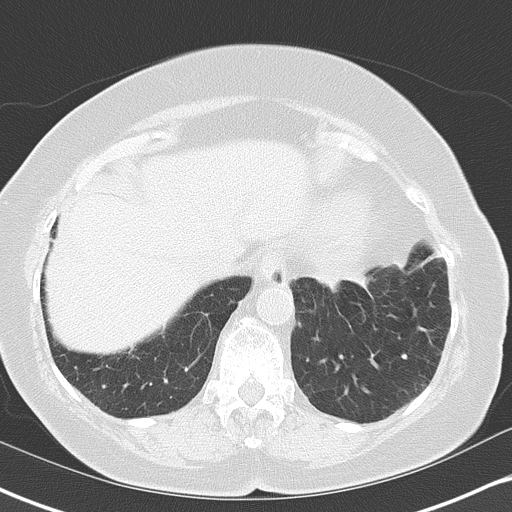
[im 14/56  lung]
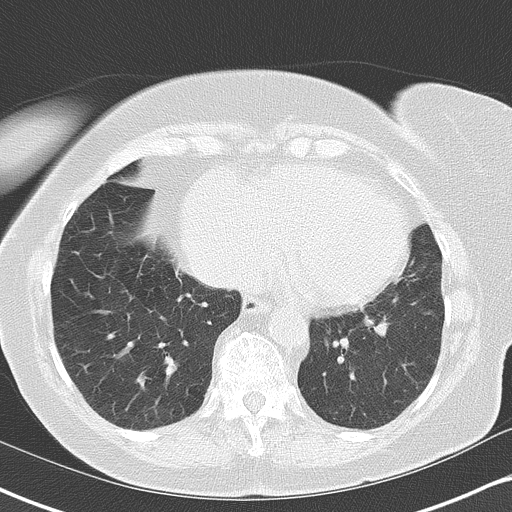
[im 21/56  lung]
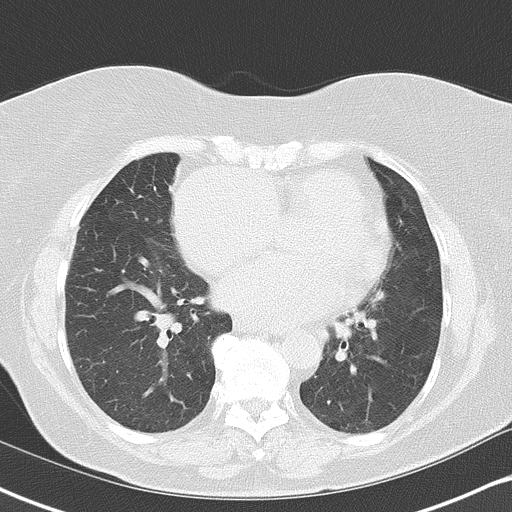
[im 28/56  lung]
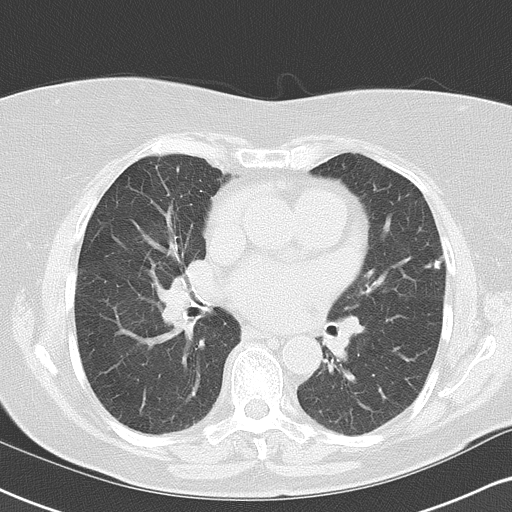
[im 35/56  lung]
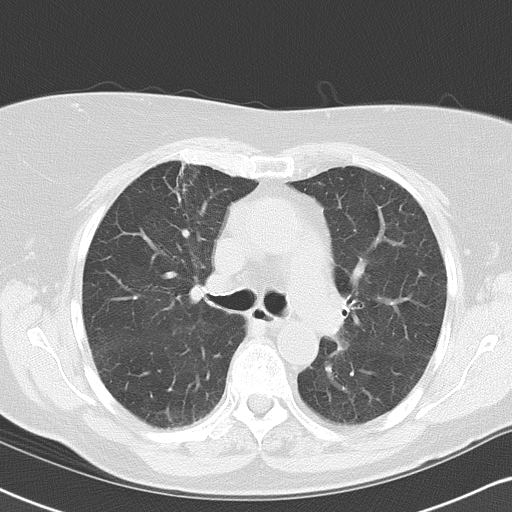
[im 42/56  lung]
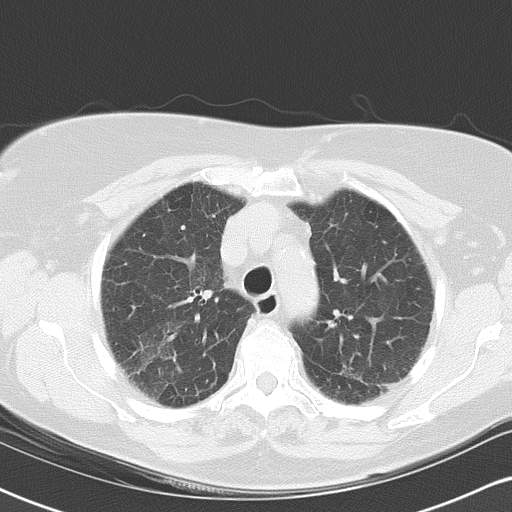
[im 49/56  lung]
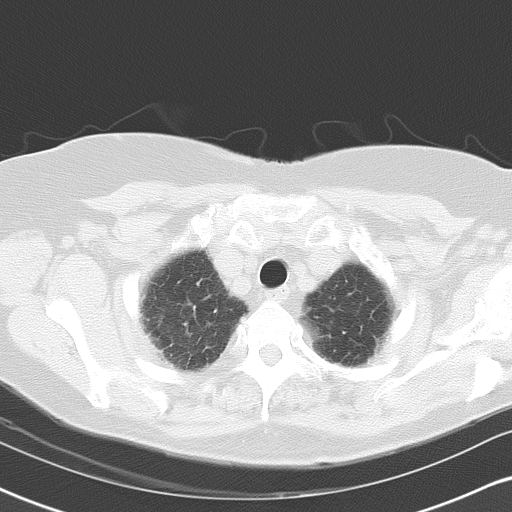

[Series 8: hires retro entire lungs · axial · 0.62mm/px · z∈[-254,-104]mm · 3 of 31 slices shown, 4 images]
[im 8/31  mediastinal]
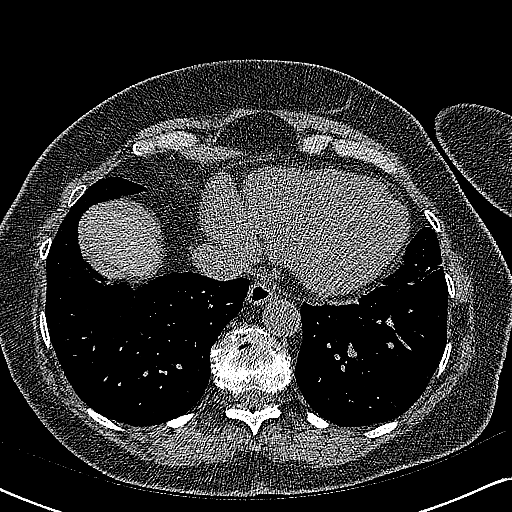
[im 8/31  lung]
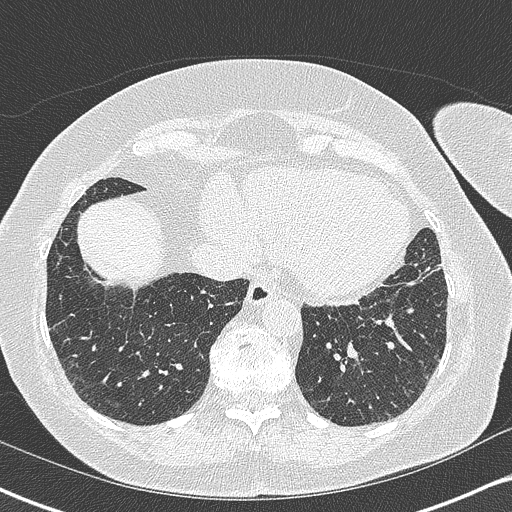
[im 16/31  lung]
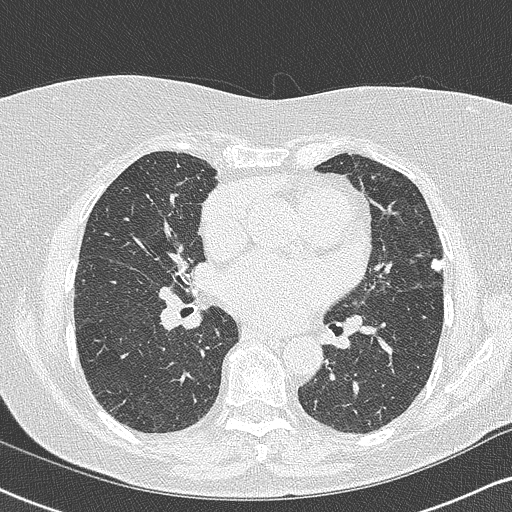
[im 23/31  lung]
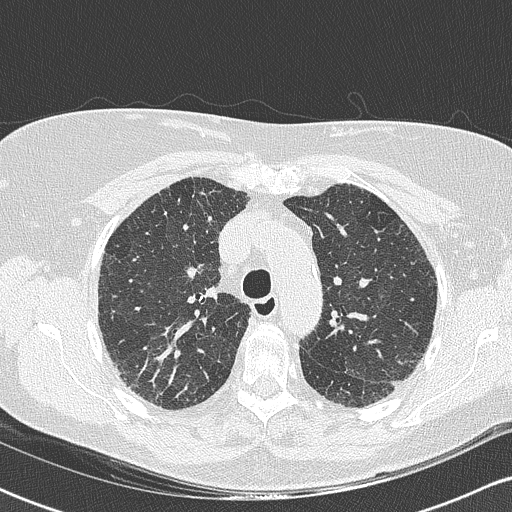

[Series 602: cor · coronal · 0.62mm/px · 3 of 108 slices shown]
[im 22/108  lung]
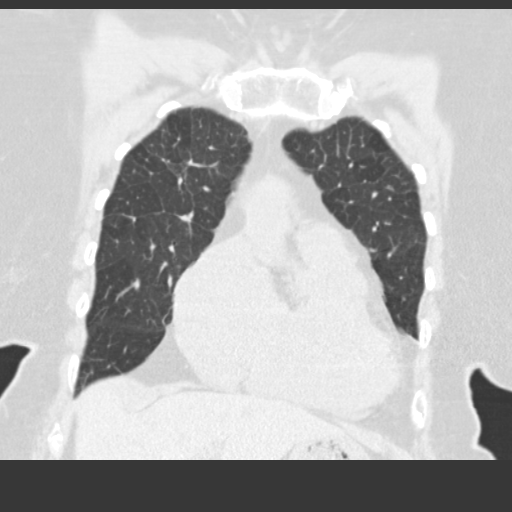
[im 43/108  lung]
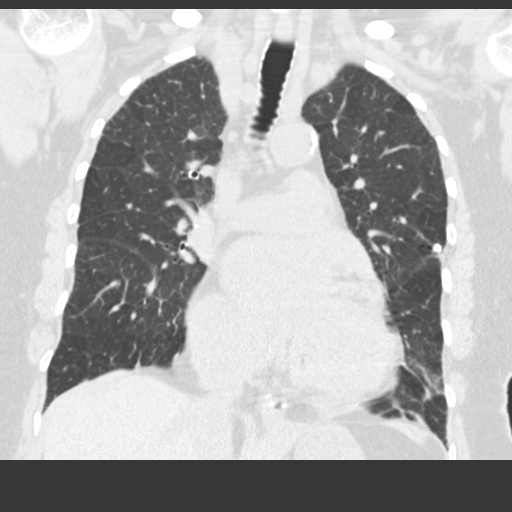
[im 65/108  lung]
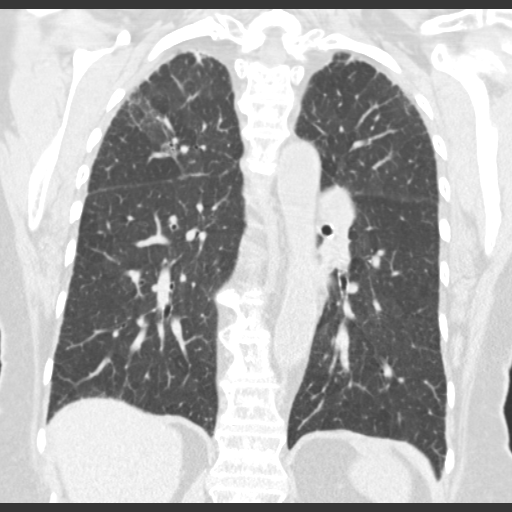

[13 of 36 positions shown; findings below may reference images not displayed]

FINDINGS: Mediastinum/Lymph Nodes: Heart size is enlarged with biatrial (right
greater than left) dilatation. There is no significant pericardial
fluid, thickening or pericardial calcification. There is
atherosclerosis of the thoracic aorta, the great vessels of the
mediastinum and the coronary arteries, including calcified
atherosclerotic plaque in the left main and right coronary arteries.
No pathologically enlarged mediastinal or hilar lymph nodes. Please
note that accurate exclusion of hilar adenopathy is limited on
noncontrast CT scans. Esophagus is unremarkable in appearance. No
axillary lymphadenopathy.

Lungs/Pleura: 6 mm calcified granuloma in the periphery of the
inferior segment of the lingula. No other suspicious appearing
pulmonary nodules or masses. No acute consolidative airspace
disease. No pleural effusions. High-resolution images demonstrate
some patchy areas of ground-glass attenuation with some very mild
septal thickening and scattered areas of mild subpleural
reticulation. There is a slight upper lung predominance of these
findings, an no significant change compared to the prior study from
11/24/2013. Some scattered areas of very mild cylindrical
bronchiectasis are noted, most evident in the anterior aspect of the
right upper lobe. No honeycombing. Inspiratory and expiratory
imaging demonstrates extensive air trapping, compatible with small
airways disease.

Upper Abdomen: Small calcified granuloma in the spleen.

Musculoskeletal/Soft Tissues: There are no aggressive appearing
lytic or blastic lesions noted in the visualized portions of the
skeleton.
IMPRESSION: 1. The appearance of the lungs is very similar to the prior study,
again demonstrating findings that are most compatible with
nonspecific interstitial pneumonia (NSIP).
2. Atherosclerosis, including left main and right coronary artery
disease. Assessment for potential risk factor modification, dietary
therapy or pharmacologic therapy may be warranted, if clinically
indicated.
3. Cardiomegaly with biatrial dilatation.

## 2016-06-29 DIAGNOSIS — M5416 Radiculopathy, lumbar region: Secondary | ICD-10-CM | POA: Diagnosis not present

## 2016-06-29 DIAGNOSIS — M48062 Spinal stenosis, lumbar region with neurogenic claudication: Secondary | ICD-10-CM | POA: Diagnosis not present

## 2016-07-03 ENCOUNTER — Ambulatory Visit (INDEPENDENT_AMBULATORY_CARE_PROVIDER_SITE_OTHER): Payer: Medicare Other | Admitting: Internal Medicine

## 2016-07-03 ENCOUNTER — Encounter: Payer: Self-pay | Admitting: Internal Medicine

## 2016-07-03 VITALS — BP 130/72 | HR 76 | Ht 65.5 in | Wt 199.5 lb

## 2016-07-03 DIAGNOSIS — K589 Irritable bowel syndrome without diarrhea: Secondary | ICD-10-CM

## 2016-07-03 DIAGNOSIS — K5010 Crohn's disease of large intestine without complications: Secondary | ICD-10-CM

## 2016-07-03 NOTE — Patient Instructions (Signed)
Continue to remain off of mesalamine.  Please purchase the following medications over the counter and take as directed: Miralax 17 grams daily for mild constipation.  Please follow up as needed.  If you are age 81 or older, your body mass index should be between 23-30. Your Body mass index is 32.69 kg/m. If this is out of the aforementioned range listed, please consider follow up with your Primary Care Provider.  If you are age 22 or younger, your body mass index should be between 19-25. Your Body mass index is 32.69 kg/m. If this is out of the aformentioned range listed, please consider follow up with your Primary Care Provider.

## 2016-07-03 NOTE — Progress Notes (Signed)
   Subjective:    Patient ID: Stephanie Cortez, female    DOB: February 12, 1934, 83 y.o.   MRN: [id number]  HPI Stephanie Cortez is an 83 year old female with history of segmental colitis associated with diverticulosis of the left colon, IBS, cricopharyngeal achalasia treated with Botox and managed by ENT, A. fib on warfarin, ILD, hypertension and lumbar disc disease who is here for follow-up. She was last seen in January 2017. She reports that she has been feeling very well that shear. She stopped Lialda 3 or 4 months ago due to its expense. Fortunately with this her left-sided abdominal discomfort did not return. She has had some mild constipation. For this she's tried milk of magnesia as well as Dulcolax. Dulcolax causes cramping and some discomfort. She denies seeing blood in her stool or melena. She's had issues with lower back pain and is considering an epidural injection. This pain radiates primarily down her right leg. Appetite has been good. No trouble swallowing. No nausea or vomiting. She denies heartburn and is using omeprazole 20 mg daily. She continues warfarin with no reported bleeding.  Last colonoscopy 11/14/2011; left-sided diverticulosis without polyps  Review of Systems As per history of present illness, otherwise negative   Current Medications, Allergies, Past Medical History, Past Surgical History, Family History and Social History were reviewed in [employer] record.     Objective:   Physical Exam BP 130/72 (BP Location: Left Arm, Patient Position: Sitting, Cuff Size: Normal)   Pulse 76   Ht 5' 5.5" (1.664 m)   Wt 199 lb 8 oz (90.5 kg)   BMI 32.69 kg/m  Constitutional: Well-developed and well-nourished. No distress. HEENT: Normocephalic and atraumatic. Oropharynx is clear and moist. No oropharyngeal exudate. Conjunctivae are normal.  No scleral icterus. Neck: Neck supple. Trachea midline. Cardiovascular: Normal rate, regular rhythm and intact distal  pulses.  Pulmonary/chest: Effort normal and breath sounds normal. Abdominal: Soft, nontender, nondistended. Bowel sounds active throughout.  Extremities: no clubbing, cyanosis, or edema Neurological: Alert and oriented to person place and time. Skin: Skin is warm and dry. Psychiatric: Normal mood and affect. Behavior is normal.     Assessment & Plan:  83 year old female with history of segmental colitis associated with diverticulosis of the left colon, IBS, cricopharyngeal achalasia treated with Botox and managed by ENT, A. fib on warfarin, ILD, hypertension and lumbar disc disease who is here for follow-up  1. Segmental colitis with diverticulosis and IBS -- doing well without recurrent left-sided colitis symptoms despite stopping Lialda. Given resolution of this complaint and the fact that she has been off therapy 3-4 months, I'm going to leave her off Lialda for now. She is asked to notify me if her left-sided abdominal pain or any change in bowel habits occur. She voices understanding.  2. Mild constipation -- add MiraLAX 17 g daily.  Follow-up as needed, patient would like to follow-up in about one year which is fine with me. 15 minutes spent with the patient today. Greater than 50% was spent in counseling and coordination of care with the patient

## 2016-07-04 ENCOUNTER — Ambulatory Visit (INDEPENDENT_AMBULATORY_CARE_PROVIDER_SITE_OTHER): Payer: Medicare Other | Admitting: Pharmacist

## 2016-07-04 VITALS — BP 140/72 | HR 70

## 2016-07-04 DIAGNOSIS — Z5181 Encounter for therapeutic drug level monitoring: Secondary | ICD-10-CM

## 2016-07-04 DIAGNOSIS — I4891 Unspecified atrial fibrillation: Secondary | ICD-10-CM

## 2016-07-04 DIAGNOSIS — I10 Essential (primary) hypertension: Secondary | ICD-10-CM

## 2016-07-04 DIAGNOSIS — I482 Chronic atrial fibrillation, unspecified: Secondary | ICD-10-CM

## 2016-07-04 LAB — POCT INR: INR: 2.5

## 2016-07-04 MED ORDER — METOPROLOL TARTRATE 50 MG PO TABS: 50.0000 mg | ORAL_TABLET | Freq: Two times a day (BID) | ORAL | 3 refills | Status: DC

## 2016-07-04 MED ORDER — VALSARTAN 320 MG PO TABS: 320.0000 mg | ORAL_TABLET | Freq: Every day | ORAL | 3 refills | Status: DC

## 2016-07-04 NOTE — Progress Notes (Signed)
Patient ID: Stephanie Cortez                 DOB: 05/24/1934                      MRN: [id number]     HPI: ANNY GROGG a 83 y.o.femalea patient of Dr. Caryl Comes referred to HTN clinic.PMH permanent AFib (historically failed multiple DCCV and AAD), HTN, CHF (HFpEF with LVEF 60% in 2014), edema, hypothyroidism, and COPD. Patient's BP at her last clinic visit was 170/84. Of note, she was in pain at baseline and was on steroids for a pulmonary infection which can increase BP. Pt did not want to start new medications at that time and instead wanted to focus on lifestyle changes. She has been hesitant to increasediltiazem dose due to fatigue with higher doses. Presents today for follow up.  Pt went to see Dr Hilarie Fredrickson yesterday and BP at that time was 130/72. She is using tramadol for pain which is helping. She's also sleeping better. Pt took her BP medications today. She does not check her BP at home. Denies dizziness, orthostasis, blurred vision, and headache.  Pt's main complaint is that her Benicar and Bystolic are no longer on her formulary for Medicare [employer] in 2018.   Current HTN meds:nebivolol 10mg  daily (increased by Dr. Wynonia Lawman), furosemide 20 mg PRN, diltiazem 180mg  daily, olmesartan 40 mg daily Previously tried:N/A BP goal: <150/90 mmHg given age  Family History: HTN, Stroke (mother), Emphysema (brother), Lung cancer (father)  Social History: Never smoker, denies alcohol and illicit drug use  Diet:Patient endorses stress eating and nighttime snacking. She wishes to lose weight and has tried several times, but gets off- track easily. She reports reading labels, but she still has trouble with limiting foods with salt.   Exercise:Does not exercise, has trouble with foot and back pain.   Wt Readings from Last 3 Encounters:  07/03/16 199 lb 8 oz (90.5 kg)  06/04/16 193 lb (87.5 kg)  03/07/16 186 lb (84.4 kg)   BP Readings from Last 3 Encounters:  07/03/16 130/72  06/04/16  (!) 152/84  05/14/16 (!) 170/84   Pulse Readings from Last 3 Encounters:  07/03/16 76  06/04/16 80  05/14/16 82    Renal function: CrCl cannot be calculated (Patient's most recent lab result is older than the maximum 21 days allowed.).  Past Medical History:  Diagnosis Date  . Anemia    - Hgb 9.7gm% on 07/13/2008 in [place] -  Hgg 129gm% wiht normal irone levsl and ferritin 10/27/2008 in [place] Recurrent otitis/sinusitis  . Anxiety    chronic BZ prn  . Atrial fibrillation (HCC)    chronic anticoag  . Bronchiectasis    >PFT 07/13/2008 in [place] 1.9L/76%, FVC 2.45L/74, Ratio 79, TLC 121%, DLCO 64%  AE BRonchiectasis - Dec 2010.New Rx:  outpatient - Feb 2011 - Rx outpatient  . CHF (congestive heart failure) ([place])   . COPD (chronic obstructive pulmonary disease) (HCC)    bronchiectasis  . Cricopharyngeal achalasia   . Diverticulosis   . Dyslipidemia   . Eczema   . GERD with stricture   . Glaucoma   . H. pylori infection   . Hypertension   . Hyponatremia    chronic, s/p endo eval 06/2012  . Hypothyroid   . IBS (irritable bowel syndrome)   . Lumbar disc disease   . Segmental colitis [facility])     Current Outpatient Prescriptions on File Prior to  Visit  Medication Sig Dispense Refill  . BENICAR 40 MG tablet TAKE 1 TABLET BY MOUTH DAILY 90 tablet 1  . budesonide-formoterol (SYMBICORT) 80-4.5 MCG/ACT inhaler INHALE 2 PUFFS INTO THE LUNGS TWICE DAILY 10.2 g 6  . cetirizine (ZYRTEC) 10 MG tablet Take 10 mg by mouth daily as needed.     . Cholecalciferol (VITAMIN D PO) Take 1 tablet by mouth daily.    . Cyanocobalamin (VITAMIN B-12 PO) Take 1 tablet by mouth daily.    [name] Kitchen diltiazem (CARDIZEM CD) 180 MG 24 hr capsule Take 1 capsule (180 mg total) by mouth daily. 90 capsule 1  . FLUoxetine (PROZAC) 10 MG capsule TAKE ONE CAPSULE BY MOUTH EVERY DAY 90 capsule 0  . furosemide (LASIX) 20 MG tablet TAKE 1 TABLET(20 MG) BY MOUTH DAILY AS NEEDED 30 tablet 11  . guaiFENesin (MUCINEX) 600 MG  12 hr tablet Take 1 tablet (600 mg total) by mouth 2 (two) times daily. 60 tablet 1  . levothyroxine (SYNTHROID, LEVOTHROID) 50 MCG tablet Take 1 tablet (50 mcg total) by mouth daily. 90 tablet 3  . MAGNESIUM PO Take 1 tablet by mouth daily.     . mesalamine (LIALDA) 1.2 g EC tablet TAKE 2 TABLETS(2.4 GRAMS) BY MOUTH DAILY (Patient not taking: Reported on 07/03/2016) 60 tablet 1  . nebivolol (BYSTOLIC) 10 MG tablet Take 10 mg by mouth daily.    [name] Kitchen omeprazole (PRILOSEC) 20 MG capsule TAKE 1 CAPSULE(20 MG) BY MOUTH DAILY 90 capsule 4  . OXYGEN Inhale into the lungs. 2 Liters only at night    . Potassium (POTASSIMIN) 75 MG TABS Take 1 tablet by mouth daily. 1 tab po qd    . Respiratory Therapy Supplies (FLUTTER) DEVI Use as directed 1 each 0  . selenium 50 MCG TABS Take 50 mcg by mouth daily.    . sodium chloride (OCEAN) 0.65 % SOLN nasal spray Place 1 spray into both nostrils as needed for congestion.    . Tiotropium Bromide Monohydrate (SPIRIVA RESPIMAT) 2.5 MCG/ACT AERS Inhale 2 puffs into the lungs daily. 2 Inhaler 0  . warfarin (COUMADIN) 2.5 MG tablet Take 1/2-1 tablet by mouth daily as directed by [facility] 90 tablet 1  . Zinc 22.5 MG TABS Take by mouth daily.     No current facility-administered medications on file prior to visit.     Allergies  Allergen Reactions  . Doxycycline     Rash on face and neck  . Flagyl [Metronidazole] Nausea And Vomiting  . Moxifloxacin Other (See Comments)    REACTION: weakness  . Pantoprazole Sodium Other (See Comments)    REACTION: rash  . Penicillins Other (See Comments)    REACTION: rash     Assessment/Plan:  1. Hypertension - BP now at goal, however multiple medications are no longer affordable due to her insurance change for 2018. Benicar and Bystolic are both non formulary. Spent 45 minutes with pt looking through her insurance formulary to find more cost effective options. Will switch pt to equivalent dose of valsartan 320mg  daily and  metoprolol tartrate 50mg  BID. Sent rx in for 90 day supply with mail order since this will be the most cost affordable. Will check BP in 1 month at next INR check to ensure that BP is still at goal on alternative BP medications.  Megan E. Supple, PharmD, CPP, [place] [id number], [place], [place] [address] Phone: [phone]; Fax: [phone] 07/04/2016 5:03 PM

## 2016-07-04 NOTE — Progress Notes (Deleted)
Patient ID: Stephanie Cortez                 DOB: 07/07/1933                      MRN: [id number]     HPI: Stephanie Cortez is a 83 y.o. female patient of Dr. Caryl Comes referred to HTN clinic.PMH permanent AFib (historically failed multiple DCCV and AAD), HTN, CHF (HFpEF), edema, hypothyroidism, and COPD. Patient's BP at her last clinic visit was 170/84. Of note, she was in pain at baseline and was on steroids for a pulmonary infection which can increase BP. Pt did not want to start new medications at that time and instead wanted to focus on lifestyle changes. She has been hesitant to increasediltiazem dose due to fatigue with higher doses. At her last visit her prednisone had been discontinued and she was in significant pain. Presents today for follow up.    Current HTN meds:nebivolol 10mg  daily (increased by Dr. Wynonia Lawman), furosemide 20 mg PRN, diltiazem 180mg  daily, olmesartan 40 mg daily Previously tried:N/A BP goal: <150/90 mmHg given age  Family History: HTN, Stroke (mother), Emphysema (brother), Lung cancer (father)  Social History: Never smoker, denies alcohol and illicit drug use  Diet:Patient endorses stress eating and nighttime snacking. She wishes to lose weight and has tried several times, but gets off- track easily. She reports reading labels, but she still has trouble with limiting foods with salt.   Exercise:Does not exercise, has trouble with foot and back pain.  Home BP readings:   Wt Readings from Last 3 Encounters:  07/03/16 199 lb 8 oz (90.5 kg)  06/04/16 193 lb (87.5 kg)  03/07/16 186 lb (84.4 kg)   BP Readings from Last 3 Encounters:  07/03/16 130/72  06/04/16 (!) 152/84  05/14/16 (!) 170/84   Pulse Readings from Last 3 Encounters:  07/03/16 76  06/04/16 80  05/14/16 82    Renal function: CrCl cannot be calculated (Patient's most recent lab result is older than the maximum 21 days allowed.).  Past Medical History:  Diagnosis Date  . Anemia    -  Hgb 9.7gm% on 07/13/2008 in [place] -  Hgg 129gm% wiht normal irone levsl and ferritin 10/27/2008 in [place] Recurrent otitis/sinusitis  . Anxiety    chronic BZ prn  . Atrial fibrillation (HCC)    chronic anticoag  . Bronchiectasis    >PFT 07/13/2008 in [place] 1.9L/76%, FVC 2.45L/74, Ratio 79, TLC 121%, DLCO 64%  AE BRonchiectasis - Dec 2010.New Rx:  outpatient - Feb 2011 - Rx outpatient  . CHF (congestive heart failure) ([place])   . COPD (chronic obstructive pulmonary disease) (HCC)    bronchiectasis  . Cricopharyngeal achalasia   . Diverticulosis   . Dyslipidemia   . Eczema   . GERD with stricture   . Glaucoma   . H. pylori infection   . Hypertension   . Hyponatremia    chronic, s/p endo eval 06/2012  . Hypothyroid   . IBS (irritable bowel syndrome)   . Lumbar disc disease   . Segmental colitis [facility])     Current Outpatient Prescriptions on File Prior to Visit  Medication Sig Dispense Refill  . BENICAR 40 MG tablet TAKE 1 TABLET BY MOUTH DAILY 90 tablet 1  . budesonide-formoterol (SYMBICORT) 80-4.5 MCG/ACT inhaler INHALE 2 PUFFS INTO THE LUNGS TWICE DAILY 10.2 g 6  . cetirizine (ZYRTEC) 10 MG tablet Take 10 mg by mouth daily as  needed.     . Cholecalciferol (VITAMIN D PO) Take 1 tablet by mouth daily.    . Cyanocobalamin (VITAMIN B-12 PO) Take 1 tablet by mouth daily.    [name] Kitchen diltiazem (CARDIZEM CD) 180 MG 24 hr capsule Take 1 capsule (180 mg total) by mouth daily. 90 capsule 1  . FLUoxetine (PROZAC) 10 MG capsule TAKE ONE CAPSULE BY MOUTH EVERY DAY 90 capsule 0  . furosemide (LASIX) 20 MG tablet TAKE 1 TABLET(20 MG) BY MOUTH DAILY AS NEEDED 30 tablet 11  . guaiFENesin (MUCINEX) 600 MG 12 hr tablet Take 1 tablet (600 mg total) by mouth 2 (two) times daily. 60 tablet 1  . levothyroxine (SYNTHROID, LEVOTHROID) 50 MCG tablet Take 1 tablet (50 mcg total) by mouth daily. 90 tablet 3  . MAGNESIUM PO Take 1 tablet by mouth daily.     . mesalamine (LIALDA) 1.2 g EC tablet TAKE 2 TABLETS(2.4  GRAMS) BY MOUTH DAILY (Patient not taking: Reported on 07/03/2016) 60 tablet 1  . nebivolol (BYSTOLIC) 10 MG tablet Take 10 mg by mouth daily.    [name] Kitchen omeprazole (PRILOSEC) 20 MG capsule TAKE 1 CAPSULE(20 MG) BY MOUTH DAILY 90 capsule 4  . OXYGEN Inhale into the lungs. 2 Liters only at night    . Potassium (POTASSIMIN) 75 MG TABS Take 1 tablet by mouth daily. 1 tab po qd    . Respiratory Therapy Supplies (FLUTTER) DEVI Use as directed 1 each 0  . selenium 50 MCG TABS Take 50 mcg by mouth daily.    . sodium chloride (OCEAN) 0.65 % SOLN nasal spray Place 1 spray into both nostrils as needed for congestion.    . Tiotropium Bromide Monohydrate (SPIRIVA RESPIMAT) 2.5 MCG/ACT AERS Inhale 2 puffs into the lungs daily. 2 Inhaler 0  . warfarin (COUMADIN) 2.5 MG tablet Take 1/2-1 tablet by mouth daily as directed by [facility] 90 tablet 1  . Zinc 22.5 MG TABS Take by mouth daily.     No current facility-administered medications on file prior to visit.     Allergies  Allergen Reactions  . Doxycycline     Rash on face and neck  . Flagyl [Metronidazole] Nausea And Vomiting  . Moxifloxacin Other (See Comments)    REACTION: weakness  . Pantoprazole Sodium Other (See Comments)    REACTION: rash  . Penicillins Other (See Comments)    REACTION: rash    There were no vitals taken for this visit.   Assessment/Plan: Hypertension:    Thank you, Lelan Pons. Patterson Hammersmith, [facility]  07/04/2016 11:12 AM

## 2016-07-04 NOTE — Patient Instructions (Signed)
Continue taking your Bystolic and Benicar until you run out.  We will switch your Benicar to an equivalent dose of valsartan 320mg  once a day since this is preferred by your insurance plan.  We will switch your Bystolic to metoprolol tartrate 50mg  twice a day since this is preferred by your insurance plan.  Check your blood pressure at home.  Follow up in 1 month when you have your next Coumadin check.

## 2016-07-11 ENCOUNTER — Telehealth: Payer: Self-pay | Admitting: Pharmacist

## 2016-07-11 NOTE — Telephone Encounter (Signed)
Received fax from Normajean Glasgow with [facility] with request for pt to hold Coumadin for 5-7 days prior to spinal injection. Pt takes Coumadin for afib with CHADS2 score of 3 (HTN, HF, and age). Ok to hold Coumadin for 5 days prior to procedure. Clearance letter states that they will check patient's INR prior to procedure so we will not need to. Clearance faxed to [phone].

## 2016-07-20 ENCOUNTER — Telehealth: Payer: Self-pay | Admitting: *Deleted

## 2016-07-20 NOTE — Telephone Encounter (Signed)
Pt called Spinal Epidural for 07/19/16 was cancelled and rescheduled for 08/09/16. Thus, her last dose will be 08/03/16. She did restart her Coumadin  yesterday with her normal dosage after they called to cancel epidural. Thus instructed her to take extra 1/2 pill  today and tomorrow along with normal dosage. Per Erasmo Downer D we will recheck her on 08/09/16 in AM prior to injection.

## 2016-08-01 ENCOUNTER — Ambulatory Visit (INDEPENDENT_AMBULATORY_CARE_PROVIDER_SITE_OTHER): Payer: Medicare Other | Admitting: Pharmacist

## 2016-08-01 VITALS — BP 138/64 | HR 86

## 2016-08-01 DIAGNOSIS — Z780 Asymptomatic menopausal state: Secondary | ICD-10-CM | POA: Diagnosis not present

## 2016-08-01 DIAGNOSIS — I10 Essential (primary) hypertension: Secondary | ICD-10-CM | POA: Diagnosis not present

## 2016-08-01 DIAGNOSIS — M8588 Other specified disorders of bone density and structure, other site: Secondary | ICD-10-CM | POA: Diagnosis not present

## 2016-08-01 DIAGNOSIS — Z13820 Encounter for screening for osteoporosis: Secondary | ICD-10-CM | POA: Diagnosis not present

## 2016-08-01 NOTE — Progress Notes (Signed)
Patient ID: Stephanie Cortez                 DOB: 02-Oct-1933                      MRN: [id number]     HPI: ZAYLEI PLUFF a 83 y.o.femalea patient of Dr. Caryl Comes referred to HTN clinic.PMH permanent AFib (historically failed multiple DCCV and AAD), HTN, CHF (HFpEF with LVEF 60% in 2014), edema, hypothyroidism, and COPD. BP improved at last visit but pt had many formulary changes on her insurance for [id number] so that Winfred would no longer be covered. She was switched to equivalent dose of valsartan and metoprolol tartrate. Pt presents for HTN follow up to ensure BP readings remain stable on new regimen.  Pt states she never switched to valsartan and metoprolol because she didn't want to change her medications since they were working to control her BP. She found an independent pharmacy [facility]) that has Scientist, water quality for $40 per month and Bystolic for 99991111 per month. She prefers to pay for these rather than equivalent doses of metoprolol and valsartan which are free.  Current HTN meds:Benicar 40mg  daily, Bystolic 10mg  daily, furosemide 20 mg PRN, diltiazem 180mg  daily Previously tried:fatigue with higher doses of diltiazem BP goal: <150/90 mmHg given age  Family History: HTN, Stroke (mother), Emphysema (brother), Lung cancer (father)  Social History: Never smoker, denies alcohol and illicit drug use  Diet:Patient endorses stress eating and nighttime snacking. She wishes to lose weight and has tried several times, but gets off- track easily. She reports reading labels, but she still has trouble with limiting foods with salt.   Exercise:Does not exercise, has trouble with foot and back pain.  Wt Readings from Last 3 Encounters:  07/03/16 199 lb 8 oz (90.5 kg)  06/04/16 193 lb (87.5 kg)  03/07/16 186 lb (84.4 kg)   BP Readings from Last 3 Encounters:  07/04/16 140/72  07/03/16 130/72  06/04/16 (!) 152/84   Pulse Readings from Last 3 Encounters:  07/04/16 70  07/03/16  76  06/04/16 80    Renal function: CrCl cannot be calculated (Patient's most recent lab result is older than the maximum 21 days allowed.).  Past Medical History:  Diagnosis Date  . Anemia    - Hgb 9.7gm% on 07/13/2008 in [place] -  Hgg 129gm% wiht normal irone levsl and ferritin 10/27/2008 in [place] Recurrent otitis/sinusitis  . Anxiety    chronic BZ prn  . Atrial fibrillation (HCC)    chronic anticoag  . Bronchiectasis    >PFT 07/13/2008 in [place] 1.9L/76%, FVC 2.45L/74, Ratio 79, TLC 121%, DLCO 64%  AE BRonchiectasis - Dec 2010.New Rx:  outpatient - Feb 2011 - Rx outpatient  . CHF (congestive heart failure) ([place])   . COPD (chronic obstructive pulmonary disease) (HCC)    bronchiectasis  . Cricopharyngeal achalasia   . Diverticulosis   . Dyslipidemia   . Eczema   . GERD with stricture   . Glaucoma   . H. pylori infection   . Hypertension   . Hyponatremia    chronic, s/p endo eval 06/2012  . Hypothyroid   . IBS (irritable bowel syndrome)   . Lumbar disc disease   . Segmental colitis [facility])     Current Outpatient Prescriptions on File Prior to Visit  Medication Sig Dispense Refill  . budesonide-formoterol (SYMBICORT) 80-4.5 MCG/ACT inhaler INHALE 2 PUFFS INTO THE LUNGS TWICE DAILY 10.2 g 6  .  cetirizine (ZYRTEC) 10 MG tablet Take 10 mg by mouth daily as needed.     . Cholecalciferol (VITAMIN D PO) Take 1 tablet by mouth daily.    . Cyanocobalamin (VITAMIN B-12 PO) Take 1 tablet by mouth daily.    [name] Kitchen diltiazem (CARDIZEM CD) 180 MG 24 hr capsule Take 1 capsule (180 mg total) by mouth daily. 90 capsule 1  . FLUoxetine (PROZAC) 10 MG capsule TAKE ONE CAPSULE BY MOUTH EVERY DAY 90 capsule 0  . furosemide (LASIX) 20 MG tablet TAKE 1 TABLET(20 MG) BY MOUTH DAILY AS NEEDED 30 tablet 11  . guaiFENesin (MUCINEX) 600 MG 12 hr tablet Take 1 tablet (600 mg total) by mouth 2 (two) times daily. 60 tablet 1  . levothyroxine (SYNTHROID, LEVOTHROID) 50 MCG tablet Take 1 tablet (50 mcg total)  by mouth daily. 90 tablet 3  . MAGNESIUM PO Take 1 tablet by mouth daily.     . mesalamine (LIALDA) 1.2 g EC tablet TAKE 2 TABLETS(2.4 GRAMS) BY MOUTH DAILY (Patient not taking: Reported on 07/03/2016) 60 tablet 1  . metoprolol (LOPRESSOR) 50 MG tablet Take 1 tablet (50 mg total) by mouth 2 (two) times daily. 180 tablet 3  . omeprazole (PRILOSEC) 20 MG capsule TAKE 1 CAPSULE(20 MG) BY MOUTH DAILY 90 capsule 4  . OXYGEN Inhale into the lungs. 2 Liters only at night    . Potassium (POTASSIMIN) 75 MG TABS Take 1 tablet by mouth daily. 1 tab po qd    . Respiratory Therapy Supplies (FLUTTER) DEVI Use as directed 1 each 0  . selenium 50 MCG TABS Take 50 mcg by mouth daily.    . sodium chloride (OCEAN) 0.65 % SOLN nasal spray Place 1 spray into both nostrils as needed for congestion.    . Tiotropium Bromide Monohydrate (SPIRIVA RESPIMAT) 2.5 MCG/ACT AERS Inhale 2 puffs into the lungs daily. 2 Inhaler 0  . valsartan (DIOVAN) 320 MG tablet Take 1 tablet (320 mg total) by mouth daily. 90 tablet 3  . warfarin (COUMADIN) 2.5 MG tablet Take 1/2-1 tablet by mouth daily as directed by [facility] 90 tablet 1  . Zinc 22.5 MG TABS Take by mouth daily.     No current facility-administered medications on file prior to visit.     Allergies  Allergen Reactions  . Doxycycline     Rash on face and neck  . Flagyl [Metronidazole] Nausea And Vomiting  . Moxifloxacin Other (See Comments)    REACTION: weakness  . Pantoprazole Sodium Other (See Comments)    REACTION: rash  . Penicillins Other (See Comments)    REACTION: rash     Assessment/Plan:  1. Hypertension - BP consistently at goal <150/68mmHg. At last visit, spent over an hour with pt looking through her Medicare formulary for 99991111 since Bystolic and Benicar were no longer covered. Had found equivalent doses of metoprolol and valsartan which would be free for pt. She states she thought about it and prefers to pay for Benicar and Bystolic (99991111 for a 3  month supply) since they are controlling her BP. Advised her that therapeutic alternatives would do the same, but she prefers to continue Benicar and Bystolic. No other changes needed, advised pt to call with any questions.   Elna Radovich E. Noelie Renfrow, PharmD, CPP, [place] [id number] [place], [place] [address] Phone: [phone]; Fax: [phone] 08/01/2016 3:49 PM

## 2016-08-08 DIAGNOSIS — M8588 Other specified disorders of bone density and structure, other site: Secondary | ICD-10-CM | POA: Diagnosis not present

## 2016-08-09 ENCOUNTER — Ambulatory Visit (INDEPENDENT_AMBULATORY_CARE_PROVIDER_SITE_OTHER): Payer: Medicare Other | Admitting: *Deleted

## 2016-08-09 DIAGNOSIS — I482 Chronic atrial fibrillation, unspecified: Secondary | ICD-10-CM

## 2016-08-09 DIAGNOSIS — I4891 Unspecified atrial fibrillation: Secondary | ICD-10-CM | POA: Diagnosis not present

## 2016-08-09 DIAGNOSIS — Z5181 Encounter for therapeutic drug level monitoring: Secondary | ICD-10-CM | POA: Diagnosis not present

## 2016-08-09 LAB — POCT INR: INR: 1.2

## 2016-08-16 ENCOUNTER — Ambulatory Visit (INDEPENDENT_AMBULATORY_CARE_PROVIDER_SITE_OTHER): Payer: Medicare Other | Admitting: *Deleted

## 2016-08-16 DIAGNOSIS — I482 Chronic atrial fibrillation, unspecified: Secondary | ICD-10-CM

## 2016-08-16 DIAGNOSIS — I4891 Unspecified atrial fibrillation: Secondary | ICD-10-CM | POA: Diagnosis not present

## 2016-08-16 DIAGNOSIS — Z5181 Encounter for therapeutic drug level monitoring: Secondary | ICD-10-CM | POA: Diagnosis not present

## 2016-08-16 LAB — POCT INR: INR: 1.7

## 2016-08-29 DIAGNOSIS — H40013 Open angle with borderline findings, low risk, bilateral: Secondary | ICD-10-CM | POA: Diagnosis not present

## 2016-08-29 DIAGNOSIS — H1013 Acute atopic conjunctivitis, bilateral: Secondary | ICD-10-CM | POA: Diagnosis not present

## 2016-08-29 DIAGNOSIS — H179 Unspecified corneal scar and opacity: Secondary | ICD-10-CM | POA: Diagnosis not present

## 2016-08-29 DIAGNOSIS — H04129 Dry eye syndrome of unspecified lacrimal gland: Secondary | ICD-10-CM | POA: Diagnosis not present

## 2016-08-30 ENCOUNTER — Ambulatory Visit (INDEPENDENT_AMBULATORY_CARE_PROVIDER_SITE_OTHER): Payer: Medicare Other | Admitting: *Deleted

## 2016-08-30 DIAGNOSIS — Z5181 Encounter for therapeutic drug level monitoring: Secondary | ICD-10-CM | POA: Diagnosis not present

## 2016-08-30 DIAGNOSIS — I482 Chronic atrial fibrillation, unspecified: Secondary | ICD-10-CM

## 2016-08-30 DIAGNOSIS — I4891 Unspecified atrial fibrillation: Secondary | ICD-10-CM | POA: Diagnosis not present

## 2016-08-30 LAB — POCT INR: INR: 1.9

## 2016-09-13 ENCOUNTER — Ambulatory Visit (INDEPENDENT_AMBULATORY_CARE_PROVIDER_SITE_OTHER): Payer: Medicare Other | Admitting: *Deleted

## 2016-09-13 DIAGNOSIS — I482 Chronic atrial fibrillation, unspecified: Secondary | ICD-10-CM

## 2016-09-13 DIAGNOSIS — I4891 Unspecified atrial fibrillation: Secondary | ICD-10-CM

## 2016-09-13 DIAGNOSIS — Z5181 Encounter for therapeutic drug level monitoring: Secondary | ICD-10-CM | POA: Diagnosis not present

## 2016-09-13 LAB — POCT INR: INR: 1.7

## 2016-09-28 ENCOUNTER — Ambulatory Visit (INDEPENDENT_AMBULATORY_CARE_PROVIDER_SITE_OTHER): Payer: Medicare Other | Admitting: *Deleted

## 2016-09-28 ENCOUNTER — Encounter: Payer: Self-pay | Admitting: Internal Medicine

## 2016-09-28 ENCOUNTER — Ambulatory Visit (INDEPENDENT_AMBULATORY_CARE_PROVIDER_SITE_OTHER): Payer: Medicare Other | Admitting: Internal Medicine

## 2016-09-28 VITALS — BP 140/74 | HR 80 | Ht 66.0 in | Wt 197.0 lb

## 2016-09-28 DIAGNOSIS — I482 Chronic atrial fibrillation, unspecified: Secondary | ICD-10-CM

## 2016-09-28 DIAGNOSIS — I4821 Permanent atrial fibrillation: Secondary | ICD-10-CM

## 2016-09-28 DIAGNOSIS — I951 Orthostatic hypotension: Secondary | ICD-10-CM

## 2016-09-28 DIAGNOSIS — I5030 Unspecified diastolic (congestive) heart failure: Secondary | ICD-10-CM | POA: Diagnosis not present

## 2016-09-28 DIAGNOSIS — Z5181 Encounter for therapeutic drug level monitoring: Secondary | ICD-10-CM

## 2016-09-28 DIAGNOSIS — I4891 Unspecified atrial fibrillation: Secondary | ICD-10-CM | POA: Diagnosis not present

## 2016-09-28 LAB — POCT INR: INR: 3.4

## 2016-09-28 NOTE — Patient Instructions (Signed)
Medication Instructions: - Your physician recommends that you continue on your current medications as directed. Please refer to the Current Medication list given to you today.  Labwork: - none ordered  Procedures/Testing: - none ordered  Follow-Up: - Your physician wants you to follow-up in: 1 year with Dr. Caryl Comes. You will receive a reminder letter in the mail two months in advance. If you don't receive a letter, please call our office to schedule the follow-up appointment.   Any Additional Special Instructions Will Be Listed Below (If Applicable). ** please call the office at [phone] to let Dr. Caryl Comes know if you decide to start on a blood thinner**  - Eliquis - Pradaxa -Xarelto   If you need a refill on your cardiac medications before your next appointment, please call your pharmacy.

## 2016-09-28 NOTE — Progress Notes (Signed)
Patient Care Team: [name], MD as PCP - General (Family Medicine) Deboraha Sprang, MD (Cardiology) Sable Feil, MD (Gastroenterology) Brand Males, MD (Pulmonary Disease) Renato Shin, MD (Endocrinology) Erline Levine, MD (Neurosurgery) Weber Cooks, MD (Inactive) (Orthopedic Surgery) Rozetta Nunnery, MD (Otolaryngology)   HPI  Stephanie Cortez is a 83 y.o. female Seen in followup for long-standing atrial fibrillation dating back about 10 years. It is felt to be permanent having failed multiple cardioversions and antiarrhythmic drugs.   She was admitted 12/14  [place]  because of chest pain elevated blood pressure and   atrial fibrillation with a rapid rate. She underwent a catheterization which was nonobstructive apparently. An echocardiogram was also normal except for left atrial enlargement.    Sh has been diagnosed with ILD and is followed by Dr Chase Caller  This has been stable  Difficult to manage blood pressures been assisted blood pressure clinic/pharmacy.  stable On Bystolic diltiazem and Benicar   No orthostatic issues of late  Her sister is much improved assisted living.     Past Medical History:  Diagnosis Date  . Anemia    - Hgb 9.7gm% on 07/13/2008 in [place] -  Hgg 129gm% wiht normal irone levsl and ferritin 10/27/2008 in [place] Recurrent otitis/sinusitis  . Anxiety    chronic BZ prn  . Atrial fibrillation (HCC)    chronic anticoag  . Bronchiectasis    >PFT 07/13/2008 in [place] 1.9L/76%, FVC 2.45L/74, Ratio 79, TLC 121%, DLCO 64%  AE BRonchiectasis - Dec 2010.New Rx:  outpatient - Feb 2011 - Rx outpatient  . CHF (congestive heart failure) ([place])   . COPD (chronic obstructive pulmonary disease) (HCC)    bronchiectasis  . Cricopharyngeal achalasia   . Diverticulosis   . Dyslipidemia   . Eczema   . GERD with stricture   . Glaucoma   . H. pylori infection   . Hypertension   . Hyponatremia    chronic, s/p endo eval 06/2012  .  Hypothyroid   . IBS (irritable bowel syndrome)   . Lumbar disc disease   . Segmental colitis [facility])     Past Surgical History:  Procedure Laterality Date  . BREAST SURGERY     br bx  . CARDIAC CATHETERIZATION    . CATARACT EXTRACTION     both  . COLONOSCOPY    . ESOPHAGOSCOPY W/ BOTOX INJECTION  12/11/2011   Procedure: ESOPHAGOSCOPY WITH BOTOX INJECTION;  Surgeon: Rozetta Nunnery, MD;  Location: [facility];  Service: ENT;  Laterality: N/A;  esophogoscopy with dilation, botox injection  . FOOT SURGERY  03/14/2011   gastroc slide-rt  . TOTAL ABDOMINAL HYSTERECTOMY      Current Outpatient Prescriptions  Medication Sig Dispense Refill  . budesonide-formoterol (SYMBICORT) 80-4.5 MCG/ACT inhaler INHALE 2 PUFFS INTO THE LUNGS TWICE DAILY 10.2 g 6  . cetirizine (ZYRTEC) 10 MG tablet Take 10 mg by mouth daily as needed.     . Cholecalciferol (VITAMIN D PO) Take 1 tablet by mouth daily.    . Cyanocobalamin (VITAMIN B-12 PO) Take 1 tablet by mouth daily.    [name] Kitchen diltiazem (CARDIZEM CD) 180 MG 24 hr capsule Take 1 capsule (180 mg total) by mouth daily. 90 capsule 1  . FLUoxetine (PROZAC) 10 MG capsule TAKE ONE CAPSULE BY MOUTH EVERY DAY 90 capsule 0  . furosemide (LASIX) 20 MG tablet TAKE 1 TABLET(20 MG) BY MOUTH DAILY AS NEEDED 30 tablet 11  . guaiFENesin (  MUCINEX) 600 MG 12 hr tablet Take 1 tablet (600 mg total) by mouth 2 (two) times daily. 60 tablet 1  . levothyroxine (SYNTHROID, LEVOTHROID) 50 MCG tablet Take 1 tablet (50 mcg total) by mouth daily. 90 tablet 3  . MAGNESIUM PO Take 1 tablet by mouth daily.     . nebivolol (BYSTOLIC) 10 MG tablet Take 1 tablet (10 mg total) by mouth daily. 30 tablet 11  . olmesartan (BENICAR) 40 MG tablet Take 1 tablet (40 mg total) by mouth daily. 30 tablet 11  . omeprazole (PRILOSEC) 20 MG capsule TAKE 1 CAPSULE(20 MG) BY MOUTH DAILY 90 capsule 4  . Potassium (POTASSIMIN) 75 MG TABS Take 1 tablet by mouth daily. 1 tab po qd    .  Respiratory Therapy Supplies (FLUTTER) DEVI Use as directed 1 each 0  . selenium 50 MCG TABS Take 50 mcg by mouth daily.    . sodium chloride (OCEAN) 0.65 % SOLN nasal spray Place 1 spray into both nostrils as needed for congestion.    [name] Kitchen warfarin (COUMADIN) 2.5 MG tablet Take 1/2-1 tablet by mouth daily as directed by [facility]in Clinic 90 tablet 1  . Zinc 22.5 MG TABS Take by mouth daily.     No current facility-administered medications for this visit.     Allergies  Allergen Reactions  . Doxycycline     Rash on face and neck  . Flagyl [Metronidazole] Nausea And Vomiting  . Moxifloxacin Other (See Comments)    REACTION: weakness  . Pantoprazole Sodium Other (See Comments)    REACTION: rash  . Penicillins Other (See Comments)    REACTION: rash    Review of Systems negative except from HPI and PMH  Physical Exam BP 140/74   Pulse 80   Ht 5\' 6"  (1.676 m)   Wt 197 lb (89.4 kg)   SpO2 96%   BMI 31.80 kg/m  Well developed and well nourished in no acute distress HENT normal E scleral and icterus clear Neck Supple JVP flat; carotids brisk and full  No bruit Clear to ausculation Irregularly irregular rate and rhythm, 2/6 murmurs  Soft with active bowel sounds No clubbing cyanosis none Edema Alert and oriented, grossly normal motor and sensory function Skin Warm and Dry  ECG personally reviewed  Afib Intervals-/12/38 Axis35 Unchanged from 4/17  Assessment and  Plan  Atrial fibrillation-permanent  HFpEF  Hypertension      She is doing relatively well.   We will continue her on her current medications.   Blood pressure is well-controlled. There is a little orthostasis.   She remains on anticoagulation.  We discussed the use of the NOACs compared to Coumadin. We briefly reviewed the data of at least comparability in stroke prevention, bleeding and outcome. We discussed some of the new once wherein somewhat associated with decreased ischemic stroke risk, one to be  taken daily, and has been shown to be comparable and bleeding risk to aspirin.  We also discussed bleeding associated with warfarin as well as NOACs and a wall bleeding as a complication of all these drugs intracranial bleeding is more frequently associated with warfarin then the NOACs and a GI bleeding is more commonly associated with the latter   She is euvolemic.      We spent more than 50% of our >25 min visit in face to face counseling regarding the above

## 2016-10-03 ENCOUNTER — Other Ambulatory Visit: Payer: Self-pay | Admitting: Emergency Medicine

## 2016-10-03 MED ORDER — BUDESONIDE-FORMOTEROL FUMARATE 80-4.5 MCG/ACT IN AERO: INHALATION_SPRAY | RESPIRATORY_TRACT | 2 refills | Status: DC

## 2016-10-05 ENCOUNTER — Other Ambulatory Visit: Payer: Self-pay | Admitting: *Deleted

## 2016-10-05 MED ORDER — DILTIAZEM HCL ER COATED BEADS 180 MG PO CP24: 180.0000 mg | ORAL_CAPSULE | Freq: Every day | ORAL | 3 refills | Status: DC

## 2016-10-12 ENCOUNTER — Ambulatory Visit (INDEPENDENT_AMBULATORY_CARE_PROVIDER_SITE_OTHER): Payer: Medicare Other | Admitting: *Deleted

## 2016-10-12 DIAGNOSIS — Z5181 Encounter for therapeutic drug level monitoring: Secondary | ICD-10-CM | POA: Diagnosis not present

## 2016-10-12 DIAGNOSIS — I4891 Unspecified atrial fibrillation: Secondary | ICD-10-CM

## 2016-10-12 DIAGNOSIS — I482 Chronic atrial fibrillation, unspecified: Secondary | ICD-10-CM

## 2016-10-12 LAB — POCT INR: INR: 3.7

## 2016-10-29 ENCOUNTER — Ambulatory Visit (INDEPENDENT_AMBULATORY_CARE_PROVIDER_SITE_OTHER): Payer: Medicare Other | Admitting: *Deleted

## 2016-10-29 DIAGNOSIS — I482 Chronic atrial fibrillation, unspecified: Secondary | ICD-10-CM

## 2016-10-29 DIAGNOSIS — Z6831 Body mass index (BMI) 31.0-31.9, adult: Secondary | ICD-10-CM | POA: Diagnosis not present

## 2016-10-29 DIAGNOSIS — I4891 Unspecified atrial fibrillation: Secondary | ICD-10-CM | POA: Diagnosis not present

## 2016-10-29 DIAGNOSIS — M5416 Radiculopathy, lumbar region: Secondary | ICD-10-CM | POA: Diagnosis not present

## 2016-10-29 DIAGNOSIS — Z5181 Encounter for therapeutic drug level monitoring: Secondary | ICD-10-CM | POA: Diagnosis not present

## 2016-10-29 DIAGNOSIS — M4316 Spondylolisthesis, lumbar region: Secondary | ICD-10-CM | POA: Diagnosis not present

## 2016-10-29 DIAGNOSIS — M9983 Other biomechanical lesions of lumbar region: Secondary | ICD-10-CM | POA: Diagnosis not present

## 2016-10-29 DIAGNOSIS — I10 Essential (primary) hypertension: Secondary | ICD-10-CM | POA: Diagnosis not present

## 2016-10-29 LAB — POCT INR: INR: 3.2

## 2016-10-30 ENCOUNTER — Telehealth: Payer: Self-pay | Admitting: Pharmacist

## 2016-10-30 ENCOUNTER — Telehealth: Payer: Self-pay | Admitting: *Deleted

## 2016-10-30 ENCOUNTER — Other Ambulatory Visit: Payer: Self-pay | Admitting: Neurosurgery

## 2016-10-30 ENCOUNTER — Telehealth: Payer: Self-pay | Admitting: Internal Medicine

## 2016-10-30 NOTE — Telephone Encounter (Signed)
New message    Pt is calling about her medication.   Pt c/o medication issue:  1. Name of Medication: bystolic   2. How are you currently taking this medication (dosage and times per day)? Once daily   3. Are you having a reaction (difficulty breathing--STAT)? no  4. What is your medication issue? Pt is calling to find out if she can get help with this medication because her insurance drop it.

## 2016-10-30 NOTE — Telephone Encounter (Signed)
I called and spoke with the patient- she states that her insurance has dropped her bystolic and benicar- she is able to afford the benicar out of pocket, the the bystolic is about $833/ month. She is afraid to switch medications as this combination of drugs seems to help control her BP very well.   I advised her I will discuss with the Prior Authorization nurse to see it there is patient assistance for bystolic/ other ideas they have that we could try to help her without actually switching her off the bystolic.  I will call her back once I hear back from the Prior [place]. She is agreeable.

## 2016-10-30 NOTE — Telephone Encounter (Signed)
Called Dr. Melven Sartorius office because pt stated a yesterday's CV[facility] appt that she will have back surgery on 11/27/16. Spoke with Janett Billow at Dr. Melven Sartorius office & she stated they have not sent any forms at this time therefore, gave her the [facility] fax number to use to ensure we get a clearance form.

## 2016-10-30 NOTE — Telephone Encounter (Signed)
Received fax from [facility] with request for pt to hold Coumadin prior to L4-S1 decompression with maximum access posterior lumbar fusion scheduled with Dr Vertell Limber on 11/27/16. Pt takes Coumadin for afib with CHADS2 score of 3 (HTN, HF, and age) and CHADS2VASC of 5. Ok to hold Coumadin for 5 days prior to procedure.  Clearance faxed to [phone].

## 2016-10-31 NOTE — Telephone Encounter (Signed)
We do have pt assistance forms for Bystolic. Also, the pt can try web sites such as Needymeds.org (Help line: 860-817-0850), RXassist, [place] or RX Outreach (for people with little to no health insurance coverage) as they have coupons that can save the patient a lot of money. Please let me know if I can assist with any of these suggestions.

## 2016-10-31 NOTE — Telephone Encounter (Signed)
I left a message for the patient to call. 

## 2016-10-31 NOTE — Telephone Encounter (Signed)
I spoke with the patient and gave her the names of the websites to go to for coupon access for bystolic. I have asked her to review this and then call back if she needs further assistance.  She voices understanding.

## 2016-11-12 ENCOUNTER — Ambulatory Visit (INDEPENDENT_AMBULATORY_CARE_PROVIDER_SITE_OTHER): Payer: Medicare Other | Admitting: Pharmacist

## 2016-11-12 ENCOUNTER — Telehealth: Payer: Self-pay

## 2016-11-12 DIAGNOSIS — Z5181 Encounter for therapeutic drug level monitoring: Secondary | ICD-10-CM | POA: Diagnosis not present

## 2016-11-12 DIAGNOSIS — I482 Chronic atrial fibrillation, unspecified: Secondary | ICD-10-CM

## 2016-11-12 DIAGNOSIS — I4891 Unspecified atrial fibrillation: Secondary | ICD-10-CM | POA: Diagnosis not present

## 2016-11-12 LAB — POCT INR: INR: 2.8

## 2016-11-12 NOTE — Telephone Encounter (Signed)
**Note De-Identified  Obfuscation** PA for Bystolic has been done through covermymeds. Awaiting response.

## 2016-11-12 NOTE — Telephone Encounter (Addendum)
Bystolic PA has been approved by [employer]. Approval good until 07/01/2017. [id number].

## 2016-11-13 DIAGNOSIS — M545 Low back pain: Secondary | ICD-10-CM | POA: Diagnosis not present

## 2016-11-13 DIAGNOSIS — M4316 Spondylolisthesis, lumbar region: Secondary | ICD-10-CM | POA: Diagnosis not present

## 2016-11-19 ENCOUNTER — Inpatient Hospital Stay (HOSPITAL_COMMUNITY): Admission: RE | Admit: 2016-11-19 | Payer: Medicare Other | Source: Ambulatory Visit

## 2016-11-22 ENCOUNTER — Telehealth: Payer: Self-pay | Admitting: Internal Medicine

## 2016-11-22 NOTE — Telephone Encounter (Signed)
Spoke with patient regarding the price of Symbicort 80. Offered to help patient with patient assistance forms but she stated she has too much income coming in and knows that she will be denied.   She wants to know if you will be willing to write a letter to her insurance company to make her Symbicort more affordable.

## 2016-11-22 NOTE — Telephone Encounter (Signed)
Ok got it. For medicare patients cheaper optin is duoneb 4 times daily via DME company part B + inahled steroids via pharmacy such as arnuity once daily  We could try this   Dr. Brand Males, M.D., [facility].C.P Pulmonary and Critical Care Medicine Staff Physician [place] [facility] Pager: [phone], If no answer or between  15:00h - 7:00h: call [phone]  11/22/2016 3:07 PM

## 2016-11-22 NOTE — Telephone Encounter (Signed)
lmomtcb x1 for pt 

## 2016-11-22 NOTE — Telephone Encounter (Signed)
To clarify- pt's income is too high to qualify for manufacturer's financial assistance, but also cannot afford the copay of the Symbicort (over $160/month per pt).  Pt is stable on symbicort and does not wish to switch inhalers.  We can provide samples for now but pt is asking for a long term solution.    Please advise.  Thanks.

## 2016-11-22 NOTE — Telephone Encounter (Signed)
Ok this message is not making sense. She says she has too much money coming in but wants assistance for symbicort. Ok please give 2 samples for now. I doubt that they the insurance company will listen to my letter  Dr. Brand Males, M.D., [facility].C.P Pulmonary and Critical Care Medicine Staff Physician [place] [facility] Pager: [phone], If no answer or between  15:00h - 7:00h: call [phone]  11/22/2016 1:16 PM

## 2016-11-23 NOTE — Telephone Encounter (Signed)
Called and spoke with pt and she stated that at this time we will stay on the symbicort since this has helped her such a great deal over the years.  She stated that since starting on the symbicort she has not had any issues and wanted to stay on this instead of changing to the duoneb and arnuity.  Pt wanted MR to be aware of this and she has scheduled appt for yearly follow up for 8/22.

## 2016-12-03 ENCOUNTER — Ambulatory Visit (INDEPENDENT_AMBULATORY_CARE_PROVIDER_SITE_OTHER): Payer: Medicare Other | Admitting: *Deleted

## 2016-12-03 DIAGNOSIS — I4891 Unspecified atrial fibrillation: Secondary | ICD-10-CM | POA: Diagnosis not present

## 2016-12-03 DIAGNOSIS — I482 Chronic atrial fibrillation, unspecified: Secondary | ICD-10-CM

## 2016-12-03 DIAGNOSIS — Z5181 Encounter for therapeutic drug level monitoring: Secondary | ICD-10-CM

## 2016-12-03 LAB — POCT INR: INR: 2.6

## 2016-12-07 ENCOUNTER — Other Ambulatory Visit: Payer: Self-pay | Admitting: Internal Medicine

## 2016-12-12 DIAGNOSIS — I480 Paroxysmal atrial fibrillation: Secondary | ICD-10-CM | POA: Diagnosis not present

## 2016-12-12 DIAGNOSIS — E785 Hyperlipidemia, unspecified: Secondary | ICD-10-CM | POA: Diagnosis not present

## 2016-12-12 DIAGNOSIS — I10 Essential (primary) hypertension: Secondary | ICD-10-CM | POA: Diagnosis not present

## 2016-12-12 DIAGNOSIS — E039 Hypothyroidism, unspecified: Secondary | ICD-10-CM | POA: Diagnosis not present

## 2016-12-12 DIAGNOSIS — F419 Anxiety disorder, unspecified: Secondary | ICD-10-CM | POA: Diagnosis not present

## 2016-12-12 NOTE — Pre-Procedure Instructions (Signed)
    Stephanie Cortez  12/12/2016      [place], [place] [address] Phone: [phone] Fax: [phone]    Your procedure is scheduled on 12/20/16.  Report to [facility] Admitting at 9 A.M.  Call this number if you have problems the morning of surgery:  [phone]   Remember:  Do not eat food or drink liquids after midnight.  Take these medicines the morning of surgery with A SIP OF WATER ---tylenol,cardizem,prozac,bystolic,prilosec,all inhalers   Do not wear jewelry, make-up or nail polish.  Do not wear lotions, powders, or perfumes, or deoderant.  Do not shave 48 hours prior to surgery.  Men may shave face and neck.  Do not bring valuables to the hospital.  [name] is not responsible for any belongings or valuables.  Contacts, dentures or bridgework may not be worn into surgery.  Leave your suitcase in the car.  After surgery it may be brought to your room.  For patients admitted to the hospital, discharge time will be determined by your treatment team.  Patients discharged the day of surgery will not be allowed to drive home.   Name and phone number of your driver:   Special instructions:  Do not take any aspirin,anti-inflammatories,vitamins,or herbal supplements 5-7 days prior to surgery.  Please read over the following fact sheets that you were given. MRSA Information

## 2016-12-13 ENCOUNTER — Encounter (HOSPITAL_COMMUNITY)
Admission: RE | Admit: 2016-12-13 | Discharge: 2016-12-13 | Disposition: A | Discharge status: Home / Self Care | Payer: Medicare Other | Source: Ambulatory Visit | Attending: Neurosurgery | Admitting: Neurosurgery

## 2016-12-13 ENCOUNTER — Encounter (HOSPITAL_COMMUNITY): Payer: Self-pay

## 2016-12-13 DIAGNOSIS — I509 Heart failure, unspecified: Secondary | ICD-10-CM | POA: Diagnosis not present

## 2016-12-13 DIAGNOSIS — I4891 Unspecified atrial fibrillation: Secondary | ICD-10-CM | POA: Insufficient documentation

## 2016-12-13 DIAGNOSIS — Z7901 Long term (current) use of anticoagulants: Secondary | ICD-10-CM | POA: Diagnosis not present

## 2016-12-13 DIAGNOSIS — E785 Hyperlipidemia, unspecified: Secondary | ICD-10-CM | POA: Insufficient documentation

## 2016-12-13 DIAGNOSIS — E039 Hypothyroidism, unspecified: Secondary | ICD-10-CM | POA: Insufficient documentation

## 2016-12-13 DIAGNOSIS — K219 Gastro-esophageal reflux disease without esophagitis: Secondary | ICD-10-CM | POA: Diagnosis not present

## 2016-12-13 DIAGNOSIS — J449 Chronic obstructive pulmonary disease, unspecified: Secondary | ICD-10-CM | POA: Insufficient documentation

## 2016-12-13 DIAGNOSIS — E871 Hypo-osmolality and hyponatremia: Secondary | ICD-10-CM | POA: Diagnosis not present

## 2016-12-13 DIAGNOSIS — D649 Anemia, unspecified: Secondary | ICD-10-CM | POA: Insufficient documentation

## 2016-12-13 DIAGNOSIS — Z01812 Encounter for preprocedural laboratory examination: Secondary | ICD-10-CM | POA: Insufficient documentation

## 2016-12-13 DIAGNOSIS — I110 Hypertensive heart disease with heart failure: Secondary | ICD-10-CM | POA: Insufficient documentation

## 2016-12-13 DIAGNOSIS — Z79899 Other long term (current) drug therapy: Secondary | ICD-10-CM | POA: Insufficient documentation

## 2016-12-13 HISTORY — DX: Depression, unspecified: F32.A

## 2016-12-13 HISTORY — DX: Major depressive disorder, single episode, unspecified: F32.9

## 2016-12-13 HISTORY — DX: Dizziness and giddiness: R42

## 2016-12-13 LAB — CBC
HCT: 38.9 % (ref 36.0–46.0)
Hemoglobin: 12.7 g/dL (ref 12.0–15.0)
MCH: 31.4 pg (ref 26.0–34.0)
MCHC: 32.6 g/dL (ref 30.0–36.0)
MCV: 96 fL (ref 78.0–100.0)
Platelets: 223 10*3/uL (ref 150–400)
RBC: 4.05 MIL/uL (ref 3.87–5.11)
RDW: 12.7 % (ref 11.5–15.5)
WBC: 8.8 10*3/uL (ref 4.0–10.5)

## 2016-12-13 LAB — BASIC METABOLIC PANEL
Anion gap: 7 (ref 5–15)
BUN: 10 mg/dL (ref 6–20)
CO2: 25 mmol/L (ref 22–32)
Calcium: 9.3 mg/dL (ref 8.9–10.3)
Chloride: 98 mmol/L — ABNORMAL LOW (ref 101–111)
Creatinine, Ser: 0.71 mg/dL (ref 0.44–1.00)
GFR calc Af Amer: >60 mL/min (ref >60)
GFR calc non Af Amer: >60 mL/min (ref >60)
Glucose, Bld: 81 mg/dL (ref 65–99)
Potassium: 4.8 mmol/L (ref 3.5–5.1)
Sodium: 130 mmol/L — ABNORMAL LOW (ref 135–145)

## 2016-12-13 LAB — APTT: aPTT: 46 seconds — ABNORMAL HIGH (ref 24–36)

## 2016-12-13 LAB — TYPE AND SCREEN
ABO/RH(D): O POS
Antibody Screen: NEGATIVE

## 2016-12-13 LAB — SURGICAL PCR SCREEN
MRSA, PCR: NEGATIVE
Staphylococcus aureus: NEGATIVE

## 2016-12-13 LAB — ABO/RH: ABO/RH(D): O POS

## 2016-12-13 MED ORDER — CHLORHEXIDINE GLUCONATE CLOTH 2 % EX PADS: 6.0000 | MEDICATED_PAD | Freq: Once | CUTANEOUS | Status: DC

## 2016-12-14 ENCOUNTER — Encounter (HOSPITAL_COMMUNITY): Payer: Self-pay | Admitting: Emergency Medicine

## 2016-12-14 NOTE — Progress Notes (Signed)
Anesthesia Chart Review:  Pt is an 83 year old female scheduled for L4-S1 decompression with maximum access posterior lumbar fusion without interbodies on 12/20/2016 with Erline Levine, MD  - PCP is [name], MD - EP cardiologist is Virl Axe, MD, last office visit 09/28/16.  - Pulmonologist is Brand Males, MD  PMH includes:  Atrial fibrillation, CHF, HTN, hyperlipidemia, COPD, bronchiectasis, chronic hyponatremia, cricopharyngeal achalasia, hypothyroidism, anemia, GERD with stricture. Never smoker. BMI 32.   Medications include: dilitazem, lasix, levothyroxine, nebivolol, olmesartan, prilosec, potassium, symbicort, coumadin.  Pt to stop coumadin 12/15/16.    BP (!) 181/90   Pulse 67   Temp 36.8 C   Resp 20   Ht 5\' 6"  (1.676 m)   Wt 199 lb 8 oz (90.5 kg)   SpO2 97%   BMI 32.20 kg/m    Preoperative labs reviewed.   - Na 130, Cl 98 - PTT 46.   - Will check PT and recheck PTT DOS  EKG 09/28/16: Atrial fibrillation (81 bpm) with premature aberrantly conducted complexes. RBBB.  Cardiac cath 07/01/13 [facility]):  1. Normal coronary arteries. 2. Normal LV size and function, EF 60%. 3. No mitral regurgitation, no aortic stenosis. 4. Normal left and-diastolic pressure.  Echo 06/30/13 [facility]):  1. LV chamber size normal. Global LV wall motion and contractility are within normal limits. Estimated EF 60-65%. Abnormal LV diastolic filling is observed. 2. LA is mildly dilated. 3. RV global systolic function is normal. RV systolic pressure estimated to be 25-30 mmHg. 4. RA cavity is mildly dilated. 5. Mild mitral regurgitation. 6. Mild tricuspid regurgitation. 7. Trace pulmonic regurgitation.  If PT and PTT acceptable DOS, I anticipate pt can proceed with surgery as scheduled.   Willeen Cass, FNP-BC [facility]/Anesthesiology Phone: [phone] 12/14/2016 9:43 AM

## 2016-12-18 ENCOUNTER — Telehealth: Payer: Self-pay | Admitting: Internal Medicine

## 2016-12-18 NOTE — Telephone Encounter (Signed)
-----   Message from Carvel Getting, NP sent at 12/18/2016 10:25 AM EDT ----- Regarding: cardiac clearance for surgery Hi Leno Mathes,   I am a NP with anesthesiology at [facility].  I'm the one driving the surgeon's office to get cardiac clearance on this patient.  I've tried calling your office to discuss my concerns with you so we can straighten this out, but I can't get through.  I've been on hold for 5 minutes.    Can you call me? [phone]  Thank you,   Willeen Cass, FNP-BC [facility] Short Stay Surgical Center/Anesthesiology Phone: [phone] 12/18/2016 10:27 AM

## 2016-12-18 NOTE — Telephone Encounter (Signed)
I called and spoke with Levada Dy, NP for anesthesiology department.  Per Levada Dy, she feels the patient is stable from a cardiac perspective.  She does not feel they will require cardiac clearance at this time.  They will reassess the patient the day she comes in for surgery (12/20/16).

## 2016-12-18 NOTE — Telephone Encounter (Signed)
Fax received from [facility] stating that the patient is scheduled for a L4 to S1 Decompression with maximum access posterior lumbar fusion with Dr. Vertell Limber on 12/20/16.  There is a note at the bottom of the clearance form stating that "this was signed by the pharmacist, but we acutally need Dr. Caryl Comes to address.."  I left a message for surgery scheduling to call me to clarify if they are needing cardiac clearance for this to be done under general anesthesia.   Pharmacy had noted on 10/30/16: [name]      10/30/16 12:26 PM  Note    Received fax from [facility] with request for pt to hold Coumadin prior to L4-S1 decompression with maximum access posterior lumbar fusion scheduled with Dr Vertell Limber on 11/27/16. Pt takes Coumadin for afib with CHADS2 score of 3 (HTN, HF, and age) and CHADS2VASC of 5. Ok to hold Coumadin for 5 days prior to procedure.  Clearance faxed to [phone].

## 2016-12-19 ENCOUNTER — Telehealth: Payer: Self-pay | Admitting: Pharmacist

## 2016-12-19 DIAGNOSIS — M4316 Spondylolisthesis, lumbar region: Secondary | ICD-10-CM | POA: Diagnosis not present

## 2016-12-19 DIAGNOSIS — M9983 Other biomechanical lesions of lumbar region: Secondary | ICD-10-CM | POA: Diagnosis not present

## 2016-12-19 DIAGNOSIS — M5416 Radiculopathy, lumbar region: Secondary | ICD-10-CM | POA: Diagnosis not present

## 2016-12-19 NOTE — Telephone Encounter (Signed)
Pt states she is not going through with her surgery tomorrow and is postponing it until 7/31 (2nd time procedure date has been moved). Advised pt to resume Coumadin with an extra 1/2 tab today and tomorrow, then back to usual dose. Scheduled pt for follow up INR in 2 weeks.

## 2017-01-09 ENCOUNTER — Ambulatory Visit (INDEPENDENT_AMBULATORY_CARE_PROVIDER_SITE_OTHER): Payer: Medicare Other | Admitting: *Deleted

## 2017-01-09 DIAGNOSIS — I4891 Unspecified atrial fibrillation: Secondary | ICD-10-CM | POA: Diagnosis not present

## 2017-01-09 DIAGNOSIS — Z5181 Encounter for therapeutic drug level monitoring: Secondary | ICD-10-CM

## 2017-01-09 DIAGNOSIS — I482 Chronic atrial fibrillation, unspecified: Secondary | ICD-10-CM

## 2017-01-09 LAB — POCT INR: INR: 2.8

## 2017-01-29 ENCOUNTER — Encounter (HOSPITAL_COMMUNITY): Admission: RE | Payer: Self-pay | Source: Ambulatory Visit

## 2017-01-29 ENCOUNTER — Inpatient Hospital Stay (HOSPITAL_COMMUNITY): Admission: RE | Admit: 2017-01-29 | Payer: Medicare Other | Source: Ambulatory Visit | Admitting: Neurosurgery

## 2017-01-29 SURGERY — FOR MAXIMUM ACCESS (MAS) POSTERIOR LUMBAR INTERBODY FUSION (PLIF) 2 LEVEL
Anesthesia: General | Site: Back

## 2017-02-04 DIAGNOSIS — H6992 Unspecified Eustachian tube disorder, left ear: Secondary | ICD-10-CM | POA: Diagnosis not present

## 2017-02-04 DIAGNOSIS — J029 Acute pharyngitis, unspecified: Secondary | ICD-10-CM | POA: Diagnosis not present

## 2017-02-04 DIAGNOSIS — J300 Vasomotor rhinitis: Secondary | ICD-10-CM | POA: Diagnosis not present

## 2017-02-05 ENCOUNTER — Ambulatory Visit (INDEPENDENT_AMBULATORY_CARE_PROVIDER_SITE_OTHER): Payer: Medicare Other | Admitting: *Deleted

## 2017-02-05 DIAGNOSIS — I4891 Unspecified atrial fibrillation: Secondary | ICD-10-CM | POA: Diagnosis not present

## 2017-02-05 DIAGNOSIS — I482 Chronic atrial fibrillation, unspecified: Secondary | ICD-10-CM

## 2017-02-05 DIAGNOSIS — Z5181 Encounter for therapeutic drug level monitoring: Secondary | ICD-10-CM | POA: Diagnosis not present

## 2017-02-05 LAB — POCT INR: INR: 2.1

## 2017-02-20 ENCOUNTER — Ambulatory Visit: Payer: Medicare Other | Admitting: Internal Medicine

## 2017-02-21 ENCOUNTER — Ambulatory Visit: Payer: 59 | Admitting: Internal Medicine

## 2017-02-21 DIAGNOSIS — H40013 Open angle with borderline findings, low risk, bilateral: Secondary | ICD-10-CM | POA: Diagnosis not present

## 2017-02-21 DIAGNOSIS — H35361 Drusen (degenerative) of macula, right eye: Secondary | ICD-10-CM | POA: Diagnosis not present

## 2017-02-21 DIAGNOSIS — H43813 Vitreous degeneration, bilateral: Secondary | ICD-10-CM | POA: Diagnosis not present

## 2017-02-21 DIAGNOSIS — H35033 Hypertensive retinopathy, bilateral: Secondary | ICD-10-CM | POA: Diagnosis not present

## 2017-02-25 ENCOUNTER — Telehealth: Payer: Self-pay | Admitting: Internal Medicine

## 2017-02-25 ENCOUNTER — Other Ambulatory Visit: Payer: Self-pay | Admitting: Internal Medicine

## 2017-02-25 ENCOUNTER — Ambulatory Visit: Payer: 59 | Admitting: Adult Health

## 2017-02-25 NOTE — Telephone Encounter (Signed)
Spoke with patient. I advised her that a RX has already been sent over to [facility] for her this morning. She verbalized understanding. Nothing else needed at time of call.

## 2017-03-11 ENCOUNTER — Ambulatory Visit (INDEPENDENT_AMBULATORY_CARE_PROVIDER_SITE_OTHER): Payer: Medicare Other | Admitting: *Deleted

## 2017-03-11 DIAGNOSIS — I482 Chronic atrial fibrillation, unspecified: Secondary | ICD-10-CM

## 2017-03-11 DIAGNOSIS — Z5181 Encounter for therapeutic drug level monitoring: Secondary | ICD-10-CM

## 2017-03-11 DIAGNOSIS — I4891 Unspecified atrial fibrillation: Secondary | ICD-10-CM | POA: Diagnosis not present

## 2017-03-11 LAB — POCT INR: INR: 2.8

## 2017-03-12 DIAGNOSIS — M4316 Spondylolisthesis, lumbar region: Secondary | ICD-10-CM | POA: Diagnosis not present

## 2017-03-12 DIAGNOSIS — M5416 Radiculopathy, lumbar region: Secondary | ICD-10-CM | POA: Diagnosis not present

## 2017-03-26 ENCOUNTER — Other Ambulatory Visit: Payer: Self-pay | Admitting: Neurosurgery

## 2017-03-27 ENCOUNTER — Telehealth: Payer: Self-pay | Admitting: Pharmacist

## 2017-03-27 NOTE — Telephone Encounter (Signed)
Received fax from [facility] that pt is scheduled for an L4 to S1 lumbar fusion with Dr Vertell Limber on 10/19. Pt takes warfarin for afib with CHADS2 of 3 (age, HTN, CHF) and CHADS2VASc of 62 (age x2, HTN, CHF, CAD, sex). Ok to hold warfarin for 5 days prior to procedure. Clearance faxed to [phone].

## 2017-04-04 ENCOUNTER — Ambulatory Visit (INDEPENDENT_AMBULATORY_CARE_PROVIDER_SITE_OTHER): Payer: Medicare Other | Admitting: Internal Medicine

## 2017-04-04 ENCOUNTER — Encounter: Payer: Self-pay | Admitting: Internal Medicine

## 2017-04-04 VITALS — BP 120/80 | HR 56 | Ht 66.0 in | Wt 196.6 lb

## 2017-04-04 DIAGNOSIS — Z23 Encounter for immunization: Secondary | ICD-10-CM | POA: Diagnosis not present

## 2017-04-04 DIAGNOSIS — J8489 Other specified interstitial pulmonary diseases: Secondary | ICD-10-CM

## 2017-04-04 DIAGNOSIS — R0989 Other specified symptoms and signs involving the circulatory and respiratory systems: Secondary | ICD-10-CM | POA: Diagnosis not present

## 2017-04-04 MED ORDER — BUDESONIDE-FORMOTEROL FUMARATE 80-4.5 MCG/ACT IN AERO: 2.0000 | INHALATION_SPRAY | Freq: Two times a day (BID) | RESPIRATORY_TRACT | 5 refills | Status: DC

## 2017-04-04 NOTE — Progress Notes (Signed)
Subjective:     Patient ID: Stephanie Cortez, female   DOB: [birth year], 83 y.o.   MRN: [id number]  HPI   OV 03/24/2015  Chief Complaint  Patient presents with  . Follow-up    Pt here after PFT in 01/2015. Pt using nocturnal O2 and has noticed a difference the next day in her breathing. Pt states she has a prod cough with discolored mucus, PND, sinus pressure.    Ms. Stephanie Cortez is here for follow-up with her daughter Stephanie Cortez, they are presenting for review of ILD workup. I'd always followed up with her baseline diagnosis of bronchiectasis but it started becoming more apparent that she actually has ILD. 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 According to the radiologist it has been stable since 2014 at least. Autoimmune workup showed elevated angiotensin-converting enzyme and CK but otherwise normal. Since the diagnosis of elevated CK her primary care physician has stopped her statin. There is no follow-up CK on this. We also notices that she had nocturnal desaturations restarted her on oxygen and sent her to pulmonary rehabilitation. These 2 measures haven't helped her immensely. She still continues a Symbicort for the prior diagnosis of bronchiectasis. She is inclined to continue that. She will have a flu shot today. She wants to know more about interstitial lung disease and had lot of questions about it  OV  08/11/2015: Acute office visit:  Patient presents to the office today with complaint of chest congestion and cough with yellow mucus intermittent wheezing and shortness of breath that has been ongoing for about 2-3 weeks, but per patient this has worsened over the last 2-3 days. Patient denies fever, chills, chest pain or hemoptysis. Patient endorses scant bloody mucus from her nose, that she feels this is probably related to her nocturnal oxygen use. She presented to her primary care physician Stephanie Cortez at [facility] and was treated initially with a Z-Pak. When  Z-Pak did not resolve patient's symptoms she was then treated with clarithromycin 500 mg twice daily starting 07/25/2015 for a 10 day treatment period . She is currently still taking this antibiotic. States since starting the clarithromycin that she has noticed some improvement.    OV 09/26/2015  Chief Complaint  Patient presents with  . Follow-up    Pt here after seeing SG on 08/10/15 for an acute visit. Pt here after 24mwt and PFT. Pt states she is still not feeling well. Pt c/o headache, hoarseness, wheezing, prod cough. Pt denies increase in SOB and f/c/s.    Routine office visit for this lady with ILD/NSIP pattern (previous diagnosis of bronchiectasis and on Symbicort]. Has associated sinus problems. Her daughter Stephanie Cortez is not with her today.   83 year old female presents for follow-up. She is frustrated by her chronic sinus drainage and chronic sinus tension  in the frontal area. This is been going on for many months a few years. She seen Stephanie Cortez in ENT and apparently is planning a procedure. In the last several days to a few weeks she's having increasing headache particularly in the last 3 days. However there is no increase in sinus drainage volume or change in color of the sinus drainage [baseline color is much reading between white and yellow and green]  However in the last day or 2 she's increasing cough and wheezing despite taking Symbicort.  She is frustrated by her symptoms. She wants a definite answer currently same time she is reluctant to have surgical  lung biopsy. She is also reluctant to have chronic daily prednisone because of side effects. She is also at the same time frustrated about her extreme level of curiosity and need to search the Internet about her disease condition and then this works her into an anxiety      Spirometry today 09/26/2015 shows FVC 1.55 L/54%, FEV1 1.28 L/59% and a ratio of 83. This comes 6 weeks of her short prednisone burst. This a 300 mL decline in  FVC since August 2016 but an improvement by 170 mL since June 2016   6 minute walk test today: Walk 336 m. Resting pulse ox was 98%. Peak exertion pulse ox was 94%. She had back pain but not shortness of breath.  Last CT scan of the chest was June 2016 which showed NSIP pattern  Last CT scan of the sinuses August 2013 that showed mild mucosal edema in the paranasal sinuses    OV 01/04/2016  Chief Complaint  Patient presents with  . Follow-up    Breathing has been doing well, same as usual.  Discuss oxygen, has not worn oxygen x 2 weeks.  patient only uses at night, discuss testing to get off oxygen.  Having surgery on spine in August, wants to discuss. Wheezing.    FU NSIP with air trapping and cough/wheeze - on symbicort (prior dx in 2015)  - Routine fu. Had annual HRCT - stable ILD with air trapping x 2 years. Overall well. Does not want to use nocturnal o2. But having wheeze and cough that is stable but says symbicort is not helping. Also, reports saysShe is having spine surgery to the low spine in the next few months by Dr Stephanie Cortez and she wants know her preoperative pulmonary risk. She also wants to know the basis of her interstitial lung disease which we have discussed in the past but at this point in time her main issues symptoms. Walking desaturation test 185 feet 3 laps on room at in the office she did not desaturate   OV 02/20/2016  Chief Complaint  Patient presents with  . Acute Visit    Pt c/o prod cough with yellow mucus with scant amount of blood, increase in SOB, wheezing. Pt states the spiriva makes her cough.    FU NSIP with air trapping and cough/wheeze - on symbicort (prior dx in 2015).  Last visit in July 2017 because of air trapping we took her off Symbicort and tried Spiriva and Brio she called on 02/17/2016 increasing cough. She was rotated back to Symbicort. She clearly attributes the worsening cough and symptoms to Spiriva. She feels that after going on  Symbicort she is better. However she is having some yellow mucus and feels she is having acute bronchitis. She has multiple drug allergies listed below. Regarding her interstitial lung disease this is been stable for 2 years. Last visit walking desaturation test did not desaturate. She then had overnight oxygen study 02/07/2016 and results are below and she does not need nocturnal oxygen. Therefore she will not need sleep study  OV 04/04/2017  Chief Complaint  Patient presents with  . Follow-up    Pt states that she has been doing well. Breathing is at a stable point at this moment. Occ. cough and occ. SOB. Denies any CP.    Follow-up NSIP with a trapping and cough/wheeze and Symbicort responsive   83-year-ol overall stable. No new complaints. Not seen her in over a year. No interim health issues other than back pain  for which she is avoiding back surgery. She is in need of a flu shot. She feels Symbicort is working well for her and she wants to continue this. Without Symbicort she decompensated. Walking desaturation test 185 feet 3 laps on room air: Resting heart rate 63. Final 197. Resting pulse ox 100%. Final pulse ox 99%.    has a past medical history of Anemia; Anxiety; Atrial fibrillation ([place]); Bronchiectasis; CHF (congestive heart failure) ([place]); COPD (chronic obstructive pulmonary disease) ([place]); Cricopharyngeal achalasia; Depression; Diverticulosis; Dyslipidemia; Eczema; GERD with stricture; Glaucoma; H. pylori infection; Hypertension; Hyponatremia; Hypothyroid; IBS (irritable bowel syndrome); Lumbar disc disease; Segmental colitis ([place]); and Vertigo.   reports that she has never smoked. She has never used smokeless tobacco.  Past Surgical History:  Procedure Laterality Date  . BREAST SURGERY     br bx  . CARDIAC CATHETERIZATION    . CATARACT EXTRACTION     both  . COLONOSCOPY    . ESOPHAGOSCOPY W/ BOTOX INJECTION  12/11/2011   Procedure: ESOPHAGOSCOPY WITH BOTOX INJECTION;   Surgeon: Rozetta Nunnery, MD;  Location: [facility];  Service: ENT;  Laterality: N/A;  esophogoscopy with dilation, botox injection  . FOOT SURGERY  03/14/2011   gastroc slide-rt  . TOTAL ABDOMINAL HYSTERECTOMY      Allergies  Allergen Reactions  . Doxycycline     Rash on face and neck  . Flagyl [Metronidazole] Nausea And Vomiting  . Moxifloxacin Other (See Comments)     Weakness/fatigue  . Pantoprazole Sodium Rash  . Penicillins Rash    Has patient had a PCN reaction causing immediate rash, facial/tongue/throat swelling, SOB or lightheadedness with hypotension:unsure Has patient had a PCN reaction causing severe rash involving mucus membranes or skin necrosis:unsure Has patient had a PCN reaction that required hospitalization:No Has patient had a PCN reaction occurring within the last 10 years:Yes Cannot recall exact reaction due to time lapse If all of the above answers are "NO", then may proceed with Cephalosporin use.     Immunization History  Administered Date(s) Administered  . Influenza Split 04/06/2011, 04/03/2012  . Influenza Whole 06/01/2009, 04/03/2010  . Influenza,inj,Quad PF,6+ Mos 03/10/2013, 03/24/2015  . Influenza-Unspecified 04/01/2014  . Pneumococcal Polysaccharide-23 07/04/2006    Family History  Problem Relation Age of Onset  . Hypertension Mother   . Stroke Mother   . Emphysema Brother   . Other Father        miner's lung  . Cancer Father        Lung  . Colon polyps Sister   . Pancreatic cancer Sister   . Kidney disease Sister   . Atrial fibrillation Unknown        siblings     Current Outpatient Prescriptions:  .  acetaminophen (TYLENOL 8 HOUR) 650 MG CR tablet, Take 650 mg by mouth every 8 (eight) hours as needed (for severe back pain.)., Disp: , Rfl:  .  acetaminophen (TYLENOL) 500 MG tablet, Take 500-1,000 mg by mouth every 4 (four) hours as needed (for mild to moderate pain.)., Disp: , Rfl:  .  Calcium-Magnesium-Zinc  (CAL-MAG-ZINC PO), Take 1 tablet by mouth 3 (three) times daily., Disp: , Rfl:  .  cetirizine (ZYRTEC) 10 MG tablet, Take 10 mg by mouth daily as needed (for allergies). , Disp: , Rfl:  .  Cholecalciferol (VITAMIN D-3) 1000 units CAPS, Take 4,000 Units by mouth daily., Disp: , Rfl:  .  Cyanocobalamin (VITAMIN B-12 PO), Take 1 tablet by mouth daily., Disp: ,  Rfl:  .  dextromethorphan-guaiFENesin (MUCINEX DM) 30-600 MG 12hr tablet, Take 1-2 tablets by mouth 2 (two) times daily as needed (for allegies/respiratory issues.)., Disp: , Rfl:  .  diltiazem (CARDIZEM CD) 180 MG 24 hr capsule, Take 1 capsule (180 mg total) by mouth daily. (Patient taking differently: Take 180 mg by mouth every evening. ), Disp: 90 capsule, Rfl: 3 .  FLUoxetine (PROZAC) 10 MG capsule, TAKE ONE CAPSULE BY MOUTH EVERY DAY, Disp: 90 capsule, Rfl: 0 .  fluticasone (FLONASE) 50 MCG/ACT nasal spray, Place 1 spray into both nostrils 2 (two) times daily as needed (for nasal congestion)., Disp: , Rfl:  .  levothyroxine (SYNTHROID, LEVOTHROID) 50 MCG tablet, Take 1 tablet (50 mcg total) by mouth daily., Disp: 90 tablet, Rfl: 3 .  nebivolol (BYSTOLIC) 10 MG tablet, Take 1 tablet (10 mg total) by mouth daily., Disp: 30 tablet, Rfl: 11 .  olmesartan (BENICAR) 40 MG tablet, Take 1 tablet (40 mg total) by mouth daily., Disp: 30 tablet, Rfl: 11 .  omeprazole (PRILOSEC) 20 MG capsule, TAKE 1 CAPSULE(20 MG) BY MOUTH DAILY, Disp: 90 capsule, Rfl: 4 .  POTASSIUM PO, Take 1 tablet by mouth daily., Disp: , Rfl:  .  pravastatin (PRAVACHOL) 40 MG tablet, Take 40 mg by mouth daily., Disp: , Rfl:  .  SELENIUM PO, Take 1 tablet by mouth daily., Disp: , Rfl:  .  SYMBICORT 80-4.5 MCG/ACT inhaler, INHALE 2 PUFFS TWICE DAILY, Disp: 10.2 g, Rfl: 0 .  warfarin (COUMADIN) 2.5 MG tablet, Take 1/2-1 tablet by mouth daily as directed by [facility] (Patient taking differently: Take 1.25-2.5 mg by mouth See admin instructions. Take 1 tablet (2.5 mg)  Monday,  Wednesday,  Thursday & Saturday; Take 0.5 tablet (1.25 mg) Tuesday, Friday, & Sunday), Disp: 90 tablet, Rfl: 1    Review of Systems     Objective:   Physical Exam  Constitutional: She is oriented to person, place, and time. She appears well-developed and well-nourished. No distress.  HENT:  Head: Normocephalic and atraumatic.  Right Ear: External ear normal.  Left Ear: External ear normal.  Mouth/Throat: Oropharynx is clear and moist. No oropharyngeal exudate.  Eyes: Pupils are equal, round, and reactive to light. Conjunctivae and EOM are normal. Right eye exhibits no discharge. Left eye exhibits no discharge. No scleral icterus.  Neck: Normal range of motion. Neck supple. No JVD present. No tracheal deviation present. No thyromegaly present.  Cardiovascular: Normal rate, regular rhythm, normal heart sounds and intact distal pulses.  Exam reveals no gallop and no friction rub.   No murmur heard. Pulmonary/Chest: Effort normal and breath sounds normal. No respiratory distress. She has no wheezes. She has no rales. She exhibits no tenderness.  Abdominal: Soft. Bowel sounds are normal. She exhibits no distension and no mass. There is no tenderness. There is no rebound and no guarding.  Musculoskeletal: Normal range of motion. She exhibits no edema or tenderness.  Lymphadenopathy:    She has no cervical adenopathy.  Neurological: She is alert and oriented to person, place, and time. She has normal reflexes. No cranial nerve deficit. She exhibits normal muscle tone. Coordination normal.  Skin: Skin is warm and dry. No rash noted. She is not diaphoretic. No erythema. No pallor.  Psychiatric: She has a normal mood and affect. Her behavior is normal. Judgment and thought content normal.  Vitals reviewed.  Vitals:   04/04/17 1044  BP: 120/80  Pulse: (!) 56  SpO2: 97%  Weight: 196 lb 9.6  oz (89.2 kg)  Height: 5\' 6"  (1.676 m)    Estimated body mass index is 31.73 kg/m as calculated from  the following:   Height as of this encounter: 5\' 6"  (1.676 m).   Weight as of this encounter: 196 lb 9.6 oz (89.2 kg).     Assessment:       ICD-10-CM   1. NSIP (nonspecific interstitial pneumonia) ([place]) J84.89   2. Pulmonary air trapping R09.89        Plan:      Clinically stable You are responsive to symbicort  Plan - high dose flu shot 04/04/2017 - continue symbicort as before   Followup - return in 9 months or sooner if needed    Dr. Brand Males, M.D., [facility].C.P Pulmonary and Critical Care Medicine Staff Physician [facility] Pager: [phone], If no answer or between  15:00h - 7:00h: call [phone]  04/04/2017 11:05 AM

## 2017-04-04 NOTE — Patient Instructions (Signed)
ICD-10-CM   1. NSIP (nonspecific interstitial pneumonia) ([place]) J84.89   2. Pulmonary air trapping R09.89    Clinically stable You are responsive to symbicort  Plan - high dose flu shot 04/04/2017 - continue symbicort as before   Followup - return in 9 months or sooner if needed

## 2017-04-04 NOTE — Addendum Note (Signed)
Addended by: Rosana Berger on: 04/04/2017 11:13 AM   Modules accepted: Orders

## 2017-04-08 ENCOUNTER — Ambulatory Visit (INDEPENDENT_AMBULATORY_CARE_PROVIDER_SITE_OTHER): Payer: Medicare Other | Admitting: *Deleted

## 2017-04-08 DIAGNOSIS — Z5181 Encounter for therapeutic drug level monitoring: Secondary | ICD-10-CM | POA: Diagnosis not present

## 2017-04-08 DIAGNOSIS — I4891 Unspecified atrial fibrillation: Secondary | ICD-10-CM

## 2017-04-08 DIAGNOSIS — I482 Chronic atrial fibrillation, unspecified: Secondary | ICD-10-CM

## 2017-04-08 LAB — POCT INR: INR: 1.7

## 2017-04-16 DIAGNOSIS — I10 Essential (primary) hypertension: Secondary | ICD-10-CM | POA: Diagnosis not present

## 2017-04-19 ENCOUNTER — Inpatient Hospital Stay: Admit: 2017-04-19 | Payer: 59 | Admitting: Neurosurgery

## 2017-04-19 DIAGNOSIS — M48062 Spinal stenosis, lumbar region with neurogenic claudication: Secondary | ICD-10-CM | POA: Diagnosis not present

## 2017-04-19 SURGERY — FOR MAXIMUM ACCESS (MAS) POSTERIOR LUMBAR INTERBODY FUSION (PLIF) 2 LEVEL
Anesthesia: General | Site: Back

## 2017-04-22 ENCOUNTER — Ambulatory Visit (INDEPENDENT_AMBULATORY_CARE_PROVIDER_SITE_OTHER): Payer: Medicare Other | Admitting: Pharmacist

## 2017-04-22 DIAGNOSIS — I482 Chronic atrial fibrillation, unspecified: Secondary | ICD-10-CM

## 2017-04-22 DIAGNOSIS — I4891 Unspecified atrial fibrillation: Secondary | ICD-10-CM

## 2017-04-22 DIAGNOSIS — Z5181 Encounter for therapeutic drug level monitoring: Secondary | ICD-10-CM | POA: Diagnosis not present

## 2017-04-22 LAB — POCT INR: INR: 2

## 2017-05-08 ENCOUNTER — Ambulatory Visit (INDEPENDENT_AMBULATORY_CARE_PROVIDER_SITE_OTHER): Payer: Medicare Other | Admitting: Pharmacist

## 2017-05-08 DIAGNOSIS — I482 Chronic atrial fibrillation, unspecified: Secondary | ICD-10-CM

## 2017-05-08 DIAGNOSIS — I4891 Unspecified atrial fibrillation: Secondary | ICD-10-CM | POA: Diagnosis not present

## 2017-05-08 DIAGNOSIS — Z5181 Encounter for therapeutic drug level monitoring: Secondary | ICD-10-CM | POA: Diagnosis not present

## 2017-05-08 LAB — POCT INR: INR: 1.8

## 2017-05-13 DIAGNOSIS — R944 Abnormal results of kidney function studies: Secondary | ICD-10-CM | POA: Diagnosis not present

## 2017-05-29 DIAGNOSIS — E871 Hypo-osmolality and hyponatremia: Secondary | ICD-10-CM | POA: Diagnosis not present

## 2017-05-29 DIAGNOSIS — R944 Abnormal results of kidney function studies: Secondary | ICD-10-CM | POA: Diagnosis not present

## 2017-05-29 DIAGNOSIS — J321 Chronic frontal sinusitis: Secondary | ICD-10-CM | POA: Diagnosis not present

## 2017-06-27 ENCOUNTER — Ambulatory Visit (INDEPENDENT_AMBULATORY_CARE_PROVIDER_SITE_OTHER): Payer: Medicare Other | Admitting: *Deleted

## 2017-06-27 DIAGNOSIS — I4891 Unspecified atrial fibrillation: Secondary | ICD-10-CM

## 2017-06-27 DIAGNOSIS — I482 Chronic atrial fibrillation, unspecified: Secondary | ICD-10-CM

## 2017-06-27 DIAGNOSIS — Z5181 Encounter for therapeutic drug level monitoring: Secondary | ICD-10-CM

## 2017-06-27 LAB — POCT INR: INR: 2.1

## 2017-06-27 NOTE — Patient Instructions (Signed)
Description   Continue taking 1 tablet every day except 1/2 tablet on Sundays, Tuesdays, and Fridays. Call us with any medication changes or concerns, [phone]. Continue to do 2 servings of dark leafy greens a week. Recheck in 4 weeks.

## 2017-07-01 ENCOUNTER — Ambulatory Visit (HOSPITAL_BASED_OUTPATIENT_CLINIC_OR_DEPARTMENT_OTHER)
Admission: RE | Admit: 2017-07-01 | Discharge: 2017-07-01 | Disposition: A | Discharge status: Home / Self Care | Payer: Medicare Other | Source: Ambulatory Visit

## 2017-07-01 ENCOUNTER — Other Ambulatory Visit: Payer: Self-pay | Admitting: Physician Assistant

## 2017-07-01 ENCOUNTER — Ambulatory Visit (HOSPITAL_COMMUNITY)
Admission: RE | Admit: 2017-07-01 | Discharge: 2017-07-01 | Disposition: A | Discharge status: Home / Self Care | Payer: Medicare Other | Source: Other Acute Inpatient Hospital | Attending: Physician Assistant | Admitting: Physician Assistant

## 2017-07-01 DIAGNOSIS — M7989 Other specified soft tissue disorders: Secondary | ICD-10-CM | POA: Insufficient documentation

## 2017-07-01 DIAGNOSIS — M25562 Pain in left knee: Secondary | ICD-10-CM | POA: Diagnosis not present

## 2017-07-01 NOTE — Progress Notes (Signed)
Left lower extremity venous duplex completed. No evidence of DVT, superficial thrombosis, or Reale's cyst. Toma Copier, RVS 07/01/2017, 8:36 pm

## 2017-07-04 DIAGNOSIS — M25562 Pain in left knee: Secondary | ICD-10-CM | POA: Diagnosis not present

## 2017-07-08 DIAGNOSIS — F419 Anxiety disorder, unspecified: Secondary | ICD-10-CM | POA: Diagnosis not present

## 2017-07-08 DIAGNOSIS — E785 Hyperlipidemia, unspecified: Secondary | ICD-10-CM | POA: Diagnosis not present

## 2017-07-08 DIAGNOSIS — Z1389 Encounter for screening for other disorder: Secondary | ICD-10-CM | POA: Diagnosis not present

## 2017-07-08 DIAGNOSIS — Z1211 Encounter for screening for malignant neoplasm of colon: Secondary | ICD-10-CM | POA: Diagnosis not present

## 2017-07-08 DIAGNOSIS — I10 Essential (primary) hypertension: Secondary | ICD-10-CM | POA: Diagnosis not present

## 2017-07-08 DIAGNOSIS — Z0000 Encounter for general adult medical examination without abnormal findings: Secondary | ICD-10-CM | POA: Diagnosis not present

## 2017-07-08 DIAGNOSIS — J849 Interstitial pulmonary disease, unspecified: Secondary | ICD-10-CM | POA: Diagnosis not present

## 2017-07-08 DIAGNOSIS — I480 Paroxysmal atrial fibrillation: Secondary | ICD-10-CM | POA: Diagnosis not present

## 2017-07-08 DIAGNOSIS — E039 Hypothyroidism, unspecified: Secondary | ICD-10-CM | POA: Diagnosis not present

## 2017-07-17 DIAGNOSIS — Z1211 Encounter for screening for malignant neoplasm of colon: Secondary | ICD-10-CM | POA: Diagnosis not present

## 2017-07-25 ENCOUNTER — Ambulatory Visit (INDEPENDENT_AMBULATORY_CARE_PROVIDER_SITE_OTHER): Payer: Medicare Other | Admitting: Pharmacist

## 2017-07-25 DIAGNOSIS — I4891 Unspecified atrial fibrillation: Secondary | ICD-10-CM | POA: Diagnosis not present

## 2017-07-25 DIAGNOSIS — I482 Chronic atrial fibrillation, unspecified: Secondary | ICD-10-CM

## 2017-07-25 DIAGNOSIS — Z5181 Encounter for therapeutic drug level monitoring: Secondary | ICD-10-CM | POA: Diagnosis not present

## 2017-07-25 LAB — POCT INR: INR: 2.5

## 2017-07-25 NOTE — Patient Instructions (Signed)
Description   Continue taking 1 tablet every day except 1/2 tablet on Sundays, Tuesdays, and Fridays. Call us with any medication changes or concerns, [phone]. Continue to do 2 servings of dark leafy greens a week. Recheck in 5 weeks.

## 2017-08-19 ENCOUNTER — Ambulatory Visit (INDEPENDENT_AMBULATORY_CARE_PROVIDER_SITE_OTHER): Payer: Medicare Other | Admitting: Internal Medicine

## 2017-08-19 ENCOUNTER — Telehealth: Payer: Self-pay | Admitting: *Deleted

## 2017-08-19 ENCOUNTER — Encounter: Payer: Self-pay | Admitting: Internal Medicine

## 2017-08-19 VITALS — BP 126/84 | HR 84 | Temp 98.5°F | Ht 66.0 in | Wt 193.2 lb

## 2017-08-19 DIAGNOSIS — R058 Other specified cough: Secondary | ICD-10-CM

## 2017-08-19 DIAGNOSIS — Z5181 Encounter for therapeutic drug level monitoring: Secondary | ICD-10-CM

## 2017-08-19 DIAGNOSIS — J45991 Cough variant asthma: Secondary | ICD-10-CM | POA: Insufficient documentation

## 2017-08-19 DIAGNOSIS — J329 Chronic sinusitis, unspecified: Secondary | ICD-10-CM | POA: Diagnosis not present

## 2017-08-19 DIAGNOSIS — R05 Cough: Secondary | ICD-10-CM | POA: Diagnosis not present

## 2017-08-19 MED ORDER — BUDESONIDE-FORMOTEROL FUMARATE 80-4.5 MCG/ACT IN AERO: 2.0000 | INHALATION_SPRAY | Freq: Two times a day (BID) | RESPIRATORY_TRACT | 0 refills | Status: DC

## 2017-08-19 MED ORDER — CEFDINIR 300 MG PO CAPS: 300.0000 mg | ORAL_CAPSULE | Freq: Two times a day (BID) | ORAL | 0 refills | Status: DC

## 2017-08-19 MED ORDER — PREDNISONE 10 MG PO TABS: ORAL_TABLET | ORAL | 0 refills | Status: DC

## 2017-08-19 NOTE — Telephone Encounter (Signed)
ATC X 3 and line was busy, [facility]

## 2017-08-19 NOTE — Assessment & Plan Note (Signed)
08/19/2017  After extensive coaching inhaler device  effectiveness =    90% though trigger is variably slt early or slightly late and could use a little bit more work   Despite evidence of uri/ typical trigger there is really min wheeze so no need to change rx from symb 80 2bid and no need for rescue rx   see avs for instructions unique to this ov

## 2017-08-19 NOTE — Progress Notes (Signed)
Subjective:     Patient ID: Stephanie Cortez, female   DOB: [birth year]    MRN: [id number]             Spirometry 09/26/2015 shows FVC 1.55 L/54%, FEV1 1.28 L/59% and a ratio of 83. This comes 6 weeks of her short prednisone burst. This a 300 mL decline in FVC since August 2016 but an improvement by 170 mL since June 2016  Last CT scan of the sinuses August 2013 that showed mild mucosal edema in the paranasal sinuses      Baseline never smoker  Always sinuses since her 41s > Dr [name] follows seen w/in last year symb 80 bid   08/19/2017 acute extended ov/Aadya Kindler re: acute cough in setting of flare of rhinitis? sinusitis Chief Complaint  Patient presents with  . Acute Visit    wheezing,SOB with activity,productive cough with green, thick sputum, has started mucinex   nasal symptoms flared  X one week prior to OV with facial pressure/ nasal congestion  Then worse cough/ wheeze with mucus turning green, some epistaxis but no green nasal d/c Not normally needing any kind of rescue rx symptoms Worse hs and off and on during  noct / props up on several pillows helps some Not limited by breathing from desired activities  But very sedentary    No obvious day to day or daytime variability or assoc   hemoptysis or cp or chest tightness, subjective wheeze or overt   hb symptoms. No unusual exposure hx or h/o childhood pna/ asthma or knowledge of premature birth.   Also denies any obvious fluctuation of symptoms with weather or environmental changes or other aggravating or alleviating factors except as outlined above   Current Allergies, Complete Past Medical History, Past Surgical History, Family History, and Social History were reviewed in [employer] record.  ROS  The following are not active complaints unless bolded Hoarseness, sore throat, dysphagia, dental problems, itching, sneezing,  nasal congestion or discharge of excess mucus or purulent secretions, ear ache,    fever, chills, sweats, unintended wt loss or wt gain, classically pleuritic or exertional cp,  orthopnea pnd or leg swelling, presyncope, palpitations, abdominal pain, anorexia, nausea, vomiting, diarrhea  or change in bowel habits or change in bladder habits, change in stools or change in urine, dysuria, hematuria,  rash, arthralgias, visual complaints, headache, numbness, weakness or ataxia or problems with walking or coordination,  change in mood/affect or memory.        Current Meds  Medication Sig  . acetaminophen (TYLENOL 8 HOUR) 650 MG CR tablet Take 650 mg by mouth every 8 (eight) hours as needed (for severe back pain.).  [name] Kitchen acetaminophen (TYLENOL) 500 MG tablet Take 500-1,000 mg by mouth every 4 (four) hours as needed (for mild to moderate pain.).  [name] Kitchen budesonide-formoterol (SYMBICORT) 80-4.5 MCG/ACT inhaler Inhale 2 puffs into the lungs 2 (two) times daily.  . Calcium-Magnesium-Zinc (CAL-MAG-ZINC PO) Take 1 tablet by mouth 3 (three) times daily.  . cetirizine (ZYRTEC) 10 MG tablet Take 10 mg by mouth daily as needed (for allergies).   . Cholecalciferol (VITAMIN D-3) 1000 units CAPS Take 4,000 Units by mouth daily.  . Cyanocobalamin (VITAMIN B-12 PO) Take 1 tablet by mouth daily.  [name] Kitchen dextromethorphan-guaiFENesin (MUCINEX DM) 30-600 MG 12hr tablet Take 1-2 tablets by mouth 2 (two) times daily as needed (for allegies/respiratory issues.).  [name] Kitchen diltiazem (CARDIZEM CD) 180 MG 24 hr capsule Take 1 capsule (180 mg total) by mouth daily. (  Patient taking differently: Take 180 mg by mouth every evening. )  . fluticasone (FLONASE) 50 MCG/ACT nasal spray Place 1 spray into both nostrils 2 (two) times daily as needed (for nasal congestion).  [name] Kitchen levothyroxine (SYNTHROID, LEVOTHROID) 50 MCG tablet Take 1 tablet (50 mcg total) by mouth daily.  . nebivolol (BYSTOLIC) 10 MG tablet Take 1 tablet (10 mg total) by mouth daily.  [name] Kitchen olmesartan (BENICAR) 40 MG tablet Take 1 tablet (40 mg total) by mouth daily.  [name] Kitchen  omeprazole (PRILOSEC) 20 MG capsule TAKE 1 CAPSULE(20 MG) BY MOUTH DAILY  . POTASSIUM PO Take 1 tablet by mouth daily.  . pravastatin (PRAVACHOL) 40 MG tablet Take 40 mg by mouth daily.  . SELENIUM PO Take 1 tablet by mouth daily.  [name] Kitchen warfarin (COUMADIN) 2.5 MG tablet Take 1/2-1 tablet by mouth daily as directed by [facility] Clinic (Patient taking differently: Take 1.25-2.5 mg by mouth See admin instructions. Take 1 tablet (2.5 mg)  Monday, Wednesday,  Thursday & Saturday; Take 0.5 tablet (1.25 mg) Tuesday, Friday, & Sunday)  . [DISCONTINUED] budesonide-formoterol (SYMBICORT) 80-4.5 MCG/ACT inhaler Inhale 2 puffs into the lungs 2 (two) times daily.  . [DISCONTINUED] FLUoxetine (PROZAC) 10 MG capsule TAKE ONE CAPSULE BY MOUTH EVERY DAY                   Objective:   Physical Exam     amb hoarse wf nad   Wt Readings from Last 3 Encounters:  08/19/17 193 lb 3.2 oz (87.6 kg)  04/04/17 196 lb 9.6 oz (89.2 kg)  12/13/16 199 lb 8 oz (90.5 kg)      Vital signs reviewed - Note on arrival 02 sats  95% on RA    Pops/ squeaks and moderate bilateral non-specific turbinate edema    HEENT: nl dentition,   and oropharynx. Nl external ear canals without cough reflex -  moderate bilateral non-specific turbinate edema with mucoid secretions    NECK :  without JVD/Nodes/TM/ nl carotid upstrokes bilaterally   LUNGS: no acc muscle use,  Nl contour chest with insp pops/ squeaks and a few exp rhonchi bilaterally s true wheeze  CV:  RRR  no s3 or murmur or increase in P2, and no edema   ABD:  soft and nontender with nl inspiratory excursion in the supine position. No bruits or organomegaly appreciated, bowel sounds nl  MS:  Nl gait/ ext warm without deformities, calf tenderness, cyanosis or clubbing No obvious joint restrictions   SKIN: warm and dry without lesions    NEURO:  alert, approp, nl sensorium with  no motor or cerebellar deficits apparent.         Assessment:

## 2017-08-19 NOTE — Assessment & Plan Note (Addendum)
Reviewed how to use afrin to help flonase penetrate to intended target tissue   F/u with Dr [name] prn

## 2017-08-19 NOTE — Telephone Encounter (Signed)
-----   Message from Tanda Rockers, MD sent at 08/19/2017 10:32 AM EST ----- Remind to let the coumadin dose know she's on 10 d of abx

## 2017-08-19 NOTE — Patient Instructions (Addendum)
Omnicef 300 mg twice daily x 10 days  Prednisone 10 mg take  4 each am x 2 days,   2 each am x 2 days,  1 each am x 2 days and stop   I emphasized that nasal steroids (flonase) ave no immediate benefit in terms of improving symptoms.  To help them reached the target tissue, the patient should use Afrin two puffs every 12 hours applied one min before using the nasal steroids.  Afrin should be stopped after no more than 5 days.  If the symptoms worsen, Afrin can be restarted after 5 days off of therapy to prevent rebound congestion from overuse of Afrin.  I also emphasized that in no way are nasal steroids a concern in terms of "addiction".    Work on Doctor, hospital technique:  relax and gently blow all the way out then take a nice smooth deep breath back in, triggering the inhaler at same time you start breathing in.  Hold for up to 5 seconds if you can. Blow out thru nose. Rinse and gargle with water when done - late add: should notify coumadin adviser re 10 day course of abx

## 2017-08-19 NOTE — Assessment & Plan Note (Addendum)
-   [facility] eval  08/2013   - Allergy profile 09/23/2013 >> Eos 5.2%, IgE 12.7  With neg RAST   Flare in setting of uri ? Sinusitis > rec omnicef 300 bid x 10 days and pred x 6 days then f/u if not back 100% - MAR reviewed and confirmed with pt no problem taking keflex despite pcn allergy/ non-anaphylactoid   I had an extended discussion with the patient reviewing all relevant studies completed to date and  lasting 15 to 20 minutes of a 25 minute acute visit    Each maintenance medication was reviewed in detail including most importantly the difference between maintenance and prns and under what circumstances the prns are to be triggered using an action plan format that is not reflected in the computer generated alphabetically organized AVS.    Please see AVS for specific instructions unique to this visit that I personally wrote and verbalized to the the pt in detail and then reviewed with pt  by my nurse highlighting any  changes in therapy recommended at today's visit to their plan of care.

## 2017-08-19 NOTE — Assessment & Plan Note (Signed)
On coumadin/ advised re interaction with omnicef

## 2017-08-21 NOTE — Telephone Encounter (Signed)
LMTCB

## 2017-08-22 ENCOUNTER — Telehealth: Payer: Self-pay | Admitting: *Deleted

## 2017-08-22 ENCOUNTER — Ambulatory Visit (INDEPENDENT_AMBULATORY_CARE_PROVIDER_SITE_OTHER): Payer: Medicare Other | Admitting: *Deleted

## 2017-08-22 DIAGNOSIS — I4891 Unspecified atrial fibrillation: Secondary | ICD-10-CM

## 2017-08-22 DIAGNOSIS — Z5181 Encounter for therapeutic drug level monitoring: Secondary | ICD-10-CM

## 2017-08-22 DIAGNOSIS — I482 Chronic atrial fibrillation, unspecified: Secondary | ICD-10-CM

## 2017-08-22 LAB — POCT INR: INR: 3.1

## 2017-08-22 NOTE — Patient Instructions (Signed)
Description   Take half tablet today then continue taking 1 tablet every day except 1/2 tablet on Sundays, Tuesdays, and Fridays. Call us with any medication changes or concerns, [phone]. Continue to do 2 servings of dark leafy greens a week; please ensure to have a serving greens today. Recheck in 4 weeks.

## 2017-08-22 NOTE — Telephone Encounter (Signed)
Spoke with pt who states she started Omniecf  300 mg bid for 10 days and also Prednisone 10 mg  4 tabs for 2 days then 2 tabs for 2 days then 1 tab for 2 days and started these on 08/19/2017 Instructed there is interaction between Prednisone and Coumadin and made an appt for her to be seen in coumadin clinic today and she states understanding

## 2017-08-22 NOTE — Telephone Encounter (Signed)
Called pt and advised message from the provider. Pt understood and verbalized understanding. Nothing further is needed.    

## 2017-08-23 ENCOUNTER — Other Ambulatory Visit: Payer: Self-pay | Admitting: Internal Medicine

## 2017-08-27 ENCOUNTER — Encounter: Payer: Self-pay | Admitting: Internal Medicine

## 2017-08-27 ENCOUNTER — Ambulatory Visit (INDEPENDENT_AMBULATORY_CARE_PROVIDER_SITE_OTHER): Payer: Medicare Other | Admitting: Internal Medicine

## 2017-08-27 ENCOUNTER — Telehealth: Payer: Self-pay | Admitting: Internal Medicine

## 2017-08-27 ENCOUNTER — Ambulatory Visit (INDEPENDENT_AMBULATORY_CARE_PROVIDER_SITE_OTHER)
Admission: RE | Admit: 2017-08-27 | Discharge: 2017-08-27 | Disposition: A | Discharge status: Home / Self Care | Payer: Medicare Other | Source: Ambulatory Visit | Attending: Internal Medicine | Admitting: Internal Medicine

## 2017-08-27 VITALS — BP 156/74 | HR 78 | Temp 98.3°F | Ht 66.0 in | Wt 191.6 lb

## 2017-08-27 DIAGNOSIS — R059 Cough, unspecified: Secondary | ICD-10-CM

## 2017-08-27 DIAGNOSIS — R05 Cough: Secondary | ICD-10-CM | POA: Diagnosis not present

## 2017-08-27 DIAGNOSIS — J45991 Cough variant asthma: Secondary | ICD-10-CM | POA: Diagnosis not present

## 2017-08-27 DIAGNOSIS — J8489 Other specified interstitial pulmonary diseases: Secondary | ICD-10-CM | POA: Diagnosis not present

## 2017-08-27 DIAGNOSIS — R058 Other specified cough: Secondary | ICD-10-CM

## 2017-08-27 MED ORDER — FAMOTIDINE 20 MG PO TABS
ORAL_TABLET | ORAL | Status: DC
Start: 2017-08-27 — End: 2017-10-07

## 2017-08-27 MED ORDER — TRAMADOL HCL 50 MG PO TABS: ORAL_TABLET | ORAL | 0 refills | Status: DC

## 2017-08-27 MED ORDER — BUDESONIDE-FORMOTEROL FUMARATE 80-4.5 MCG/ACT IN AERO: 2.0000 | INHALATION_SPRAY | Freq: Two times a day (BID) | RESPIRATORY_TRACT | 0 refills | Status: DC

## 2017-08-27 NOTE — Telephone Encounter (Signed)
Spoke with patient. She was seen last week by MW on 08/19/17 for bronchitis. She was prescribed a prednisone taper and cefdinir. Patient reports that since taking these medications, she is not feeling any better. She still has a productive cough, increased fatigue and SOB. She denies any fever or body aches.   She wants to know if she needs to have an additional prednisone taper or another antibiotic.   She wishes to use [employer].   MW, please advise. Thanks!

## 2017-08-27 NOTE — Telephone Encounter (Signed)
Called pt and advised message from the provider. Pt understood and verbalized understanding. Nothing further is needed.   Appt made for today at :3:15pm

## 2017-08-27 NOTE — Progress Notes (Signed)
Subjective:     Patient ID: Stephanie Cortez, female   DOB: Feb 08, 1934    MRN: [id number]     Spirometry 09/26/2015 shows FVC 1.55 L/54%, FEV1 1.28 L/59% and a ratio of 83. This comes 6 weeks of her short prednisone burst. This a 300 mL decline in FVC since August 2016 but an improvement by 170 mL since June 2016  Last CT scan of the sinuses August 2013 that showed mild mucosal edema in the paranasal sinuses      Never smoker  "Always sinus problems" since her 19s > Dr [name] follows seen w/in last year  [facility]  12/26/15 ? [facility] /ILD   08/19/2017 acute extended ov/Wert re: acute cough in setting of flare of rhinitis? sinusitis Chief Complaint  Patient presents with  . Acute Visit    wheezing,SOB with activity,productive cough with green, thick sputum, has started mucinex   nasal symptoms flared  X one week prior to OV with facial pressure/ nasal congestion  Then worse cough/ wheeze with mucus turning green, some epistaxis but no green nasal d/c Not normally needing any kind of rescue rx / does not even have one  symptoms Worse hs and off and on during  noct / props up on several pillows helps some Not limited by breathing from desired activities  But very sedentary  rec Omnicef 300 mg twice daily x 10 days Prednisone 10 mg take  4 each am x 2 days,   2 each am x 2 days,  1 each am x 2 days and stop  I emphasized that nasal steroids (flonase) ave no immediate benefit in terms of improving symptoms.  To help them reached the target tissue, the patient should use Afrin two puffs every 12 hours  Work on perfecting inhaler technique:   - late add: should notify coumadin adviser re 10 day course of abx       08/27/2017  Acute extended  ov/Wert re: refractory cough/ wheeze Chief Complaint  Patient presents with  . Acute Visit    cough and wheezing are unchanged since last visit. She is coughing up some green sputum- still taking omnicef.    Dyspnea:  No really sob Cough: less in volume,  still green, some from nose/mostly from chest/ wakes her up each am / worse also when head hit pillow  Sleep: poorly / 45 degrees SABA use:  N/a Has flutter valve not suing   No obvious day to day or daytime variability or assoc   mucus plugs or hemoptysis or cp or chest tightness, subjective wheeze or overt sinus or hb symptoms. No unusual exposure hx or h/o childhood pna/ asthma or knowledge of premature birth.  Sleeping ok flat without nocturnal  or early am exacerbation  of respiratory  c/o's or need for noct saba. Also denies any obvious fluctuation of symptoms with weather or environmental changes or other aggravating or alleviating factors except as outlined above   Current Allergies, Complete Past Medical History, Past Surgical History, Family History, and Social History were reviewed in [employer] record.  ROS  The following are not active complaints unless bolded Hoarseness, sore throat, dysphagia, dental problems, itching, sneezing,  nasal congestion or discharge of excess mucus or purulent secretions, ear ache,   fever, chills, sweats, unintended wt loss or wt gain, classically pleuritic or exertional cp,  orthopnea pnd or leg swelling, presyncope, palpitations, abdominal pain, anorexia, nausea, vomiting, diarrhea  or change in bowel habits or change in  bladder habits, change in stools or change in urine, dysuria, hematuria,  rash, arthralgias, visual complaints, headache, numbness, weakness or ataxia or problems with walking or coordination,  change in mood/affect or memory.        Current Meds  Medication Sig  . acetaminophen (TYLENOL 8 HOUR) 650 MG CR tablet Take 650 mg by mouth every 8 (eight) hours as needed (for severe back pain.).  [name] Kitchen acetaminophen (TYLENOL) 500 MG tablet Take 500-1,000 mg by mouth every 4 (four) hours as needed (for mild to moderate pain.).  [name] Kitchen budesonide-formoterol (SYMBICORT) 80-4.5 MCG/ACT inhaler Inhale 2 puffs into the lungs 2 (two)  times daily.  . Calcium-Magnesium-Zinc (CAL-MAG-ZINC PO) Take 1 tablet by mouth 3 (three) times daily.  . cefdinir (OMNICEF) 300 MG capsule Take 1 capsule (300 mg total) by mouth 2 (two) times daily.  . cetirizine (ZYRTEC) 10 MG tablet Take 10 mg by mouth daily as needed (for allergies).   . Cholecalciferol (VITAMIN D-3) 1000 units CAPS Take 4,000 Units by mouth daily.  . Cyanocobalamin (VITAMIN B-12 PO) Take 1 tablet by mouth daily.  [name] Kitchen dextromethorphan-guaiFENesin (MUCINEX DM) 30-600 MG 12hr tablet Take 1-2 tablets by mouth 2 (two) times daily as needed (for allegies/respiratory issues.).  [name] Kitchen diltiazem (CARDIZEM CD) 180 MG 24 hr capsule Take 1 capsule (180 mg total) by mouth daily. (Patient taking differently: Take 180 mg by mouth every evening. )  . fluticasone (FLONASE) 50 MCG/ACT nasal spray Place 1 spray into both nostrils 2 (two) times daily as needed (for nasal congestion).  [name] Kitchen levothyroxine (SYNTHROID, LEVOTHROID) 50 MCG tablet Take 1 tablet (50 mcg total) by mouth daily.  . nebivolol (BYSTOLIC) 10 MG tablet Take 1 tablet (10 mg total) by mouth daily.  [name] Kitchen olmesartan (BENICAR) 40 MG tablet Take 1 tablet (40 mg total) by mouth daily. Please keep upcoming appt in March for future refills. Thank you  . omeprazole (PRILOSEC) 20 MG capsule TAKE 1 CAPSULE(20 MG) BY MOUTH DAILY  . POTASSIUM PO Take 1 tablet by mouth daily.  . pravastatin (PRAVACHOL) 40 MG tablet Take 40 mg by mouth daily.  . predniSONE (DELTASONE) 10 MG tablet Take  4 each am x 2 days,   2 each am x 2 days,  1 each am x 2 days and stop  . SELENIUM PO Take 1 tablet by mouth daily.  [name] Kitchen warfarin (COUMADIN) 2.5 MG tablet Take 1/2-1 tablet by mouth daily as directed by Coumadin Clinic (Patient taking differently: Take 1.25-2.5 mg by mouth See admin instructions. Take 1 tablet (2.5 mg)  Monday, Wednesday,  Thursday & Saturday; Take 0.5 tablet (1.25 mg) Tuesday, Friday, & Sunday)           Objective:   Physical Exam     amb hoarse  wf nad   08/27/2017        19 1   08/19/17 193 lb 3.2 oz (87.6 kg)  04/04/17 196 lb 9.6 oz (89.2 kg)  12/13/16 199 lb 8 oz (90.5 kg)     Vital signs reviewed - Note on arrival 02 sats  97% on RA         HEENT: nl dentition, turbinates bilaterally, and oropharynx which is pristine s pnd. Nl external ear canals without cough reflex   NECK :  without JVD/Nodes/TM/ nl carotid upstrokes bilaterally   LUNGS: no acc muscle use,  Nl contour chest with min exp rhonchi bilaterally with urge to cough on exp   CV:  RRR  no s3 or murmur  or increase in P2, and no edema   ABD:  soft and nontender with nl inspiratory excursion in the supine position. No bruits or organomegaly appreciated, bowel sounds nl  MS:  Nl gait/ ext warm without deformities, calf tenderness, cyanosis or clubbing No obvious joint restrictions   SKIN: warm and dry without lesions    NEURO:  alert, approp, nl sensorium with  no motor or cerebellar deficits apparent.      CXR PA and Lateral:   08/27/2017 :    I personally reviewed images and   impression as follows:    my review:  Chronically increased markings/ CM s chf or acute as dz     Assessment:

## 2017-08-27 NOTE — Patient Instructions (Addendum)
Take mucinex dm up to 1200 mg  every 12 hours and use the flutter valve as much as possible  supplement if needed with  tramadol 50 mg up to 2 every 4 hours to suppress the urge to cough. Swallowing water or using ice chips/non mint and menthol or chocalate containing candies (such as lifesavers or sugarless jolly ranchers) are also effective.  You should rest your voice and avoid activities that you know make you cough.  Once you have eliminated the cough for 3 straight days try reducing the tramadol first,  then the delsym as tolerated.     Work on inhaler technique:  relax and gently blow all the way out then take a nice smooth deep breath back in, triggering the inhaler at same time you start breathing in.  Hold for up to 5 seconds if you can. Blow out thru nose. Rinse and gargle with water when done   Please see patient coordinator before you leave today  to schedule sinus CT asap   Please remember to go to the  x-ray department downstairs in the basement  for your tests - we will call you with the results when they are available.      Try prilosec otc 20mg  x 2  Take 30-60 min before first meal of the day and Pepcid ac (famotidine) 20 mg one @  bedtime until cough is completely gone for at least a week without the need for cough suppression   Please schedule a follow up office visit in 2 weeks, sooner if needed  with all medications /inhalers/ solutions in hand so we can verify exactly what you are taking. This includes all medications from all doctors and over the counters  - To see Stephanie Cortez on return

## 2017-08-27 NOTE — Telephone Encounter (Signed)
Ok to add on to end of day but needs to return with all meds in hand to regroup - nothing else to offer over the phone

## 2017-08-28 ENCOUNTER — Encounter: Payer: Self-pay | Admitting: Internal Medicine

## 2017-08-28 NOTE — Assessment & Plan Note (Signed)
-   [facility] eval  08/2013   - Allergy profile 09/23/2013 >> Eos 5.2%, IgE 12.7  With neg RAST  - 08/27/2017 encourage use of flutter/ cyclical cough regimen with tramdol - Sinus CT ordered 08/27/2017    Upper airway cough syndrome (previously labeled PNDS),  is so named because it's frequently impossible to sort out how much is  CR/sinusitis with freq throat clearing (which can be related to primary GERD)   vs  causing  secondary (" extra esophageal")  GERD from wide swings in gastric pressure that occur with throat clearing, often  promoting self use of mint and menthol lozenges that reduce the lower esophageal sphincter tone and exacerbate the problem further in a cyclical fashion.   These are the same pts (now being labeled as having "irritable larynx syndrome" by some cough centers) who not infrequently have a history of having failed to tolerate ace inhibitors,  dry powder inhalers or biphosphonates or report having atypical/extraesophageal reflux symptoms that don't respond to standard doses of PPI  and are easily confused as having aecopd or asthma flares by even experienced allergists/ pulmonologists (myself included).   Of the three most common causes of  Sub-acute or recurrent or chronic cough, only one (GERD)  can actually contribute to/ trigger  the other two (asthma and post nasal drip syndrome)  and perpetuate the cylce of cough.  While not intuitively obvious, many patients with chronic low grade reflux do not cough until there is a primary insult that disturbs the protective epithelial barrier and exposes sensitive nerve endings.   This is typically viral but can due to pnds from chronic sinus dz which may be the case here.  The point is that once this occurs, it is difficult to eliminate the cycle  using anything but a maximally effective acid suppression regimen at least in the short run, accompanied by an appropriate diet to address non acid GERD and control / eliminate the cough itself for  at least 3 days with flutter valve/tramadol and no more abx pending sinus CT   Next step is return with all meds in hand using a trust but verify approach to confirm accurate Medication  Reconciliation The principal here is that until we are certain that the  patients are doing what we've asked, it makes no sense to ask them to do more.   I had an extended discussion with the patient reviewing all relevant studies completed to date and  lasting 25 minutes of a 40  minute acute office visit  Addressing refractory and severe non-specific but potentially very serious   respiratory symptoms of uncertain and potentially multiple  etiologies.   Each maintenance medication was reviewed in detail including most importantly the difference between maintenance and prns and under what circumstances the prns are to be triggered using an action plan format that is not reflected in the computer generated alphabetically organized AVS.    Please see AVS for specific instructions unique to this office visit that I personally wrote and verbalized to the the pt in detail and then reviewed with pt  by my nurse highlighting any changes in therapy/plan of care  recommended at today's visit.

## 2017-08-28 NOTE — Assessment & Plan Note (Signed)
No evidence of dz progression by plain cxr but needs continued serial f/u with Dr Chase Caller

## 2017-08-28 NOTE — Progress Notes (Signed)
Was able to talk to the patient regarding their results.  They verbalized an understanding of what was discussed. No further questions at this time. States the Tramadol is working for the cough

## 2017-08-28 NOTE — Assessment & Plan Note (Signed)
08/27/2017  After extensive coaching inhaler device  effectiveness =    75% from a basline of < 50%   rec continue symb 80 2 bid

## 2017-09-02 ENCOUNTER — Other Ambulatory Visit: Payer: Self-pay | Admitting: Internal Medicine

## 2017-09-03 ENCOUNTER — Ambulatory Visit (INDEPENDENT_AMBULATORY_CARE_PROVIDER_SITE_OTHER)
Admission: RE | Admit: 2017-09-03 | Discharge: 2017-09-03 | Disposition: A | Discharge status: Home / Self Care | Payer: Medicare Other | Source: Ambulatory Visit | Attending: Internal Medicine | Admitting: Internal Medicine

## 2017-09-03 DIAGNOSIS — R05 Cough: Secondary | ICD-10-CM | POA: Diagnosis not present

## 2017-09-03 DIAGNOSIS — R059 Cough, unspecified: Secondary | ICD-10-CM

## 2017-09-03 DIAGNOSIS — R058 Other specified cough: Secondary | ICD-10-CM

## 2017-09-03 DIAGNOSIS — J320 Chronic maxillary sinusitis: Secondary | ICD-10-CM | POA: Diagnosis not present

## 2017-09-04 ENCOUNTER — Other Ambulatory Visit: Payer: Self-pay

## 2017-09-04 DIAGNOSIS — J329 Chronic sinusitis, unspecified: Secondary | ICD-10-CM

## 2017-09-05 DIAGNOSIS — H1013 Acute atopic conjunctivitis, bilateral: Secondary | ICD-10-CM | POA: Diagnosis not present

## 2017-09-05 DIAGNOSIS — H04129 Dry eye syndrome of unspecified lacrimal gland: Secondary | ICD-10-CM | POA: Diagnosis not present

## 2017-09-05 DIAGNOSIS — H179 Unspecified corneal scar and opacity: Secondary | ICD-10-CM | POA: Diagnosis not present

## 2017-09-05 DIAGNOSIS — H40013 Open angle with borderline findings, low risk, bilateral: Secondary | ICD-10-CM | POA: Diagnosis not present

## 2017-09-06 ENCOUNTER — Telehealth: Payer: Self-pay | Admitting: Internal Medicine

## 2017-09-06 DIAGNOSIS — R05 Cough: Secondary | ICD-10-CM

## 2017-09-06 DIAGNOSIS — R058 Other specified cough: Secondary | ICD-10-CM

## 2017-09-06 DIAGNOSIS — R059 Cough, unspecified: Secondary | ICD-10-CM | POA: Insufficient documentation

## 2017-09-06 DIAGNOSIS — J019 Acute sinusitis, unspecified: Secondary | ICD-10-CM | POA: Diagnosis not present

## 2017-09-06 MED ORDER — TRAMADOL HCL 50 MG PO TABS: ORAL_TABLET | ORAL | 0 refills | Status: DC

## 2017-09-06 NOTE — Telephone Encounter (Signed)
Ok to refill but be sure to keep appt with Tammy NP

## 2017-09-06 NOTE — Telephone Encounter (Signed)
Pt is requesting refill for Tramadol 50mg  to be mailed to address on file- verified with pt. Tramadol last refilled 08/27/17 #40 with 0 refills. Pt states she has 6 tabs left. Pt states Tramadol 50mg  has been effective for cough.  MW please advise on refill. Thanks  08/27/17 AVS: 3. Tanda Rockers, MD (Physician) at 08/27/2017 3:56 PM - Signed    Take mucinex dm up to 1200 mg  every 12 hours and use the flutter valve as much as possible  supplement if needed with  tramadol 50 mg up to 2 every 4 hours to suppress the urge to cough. Swallowing water or using ice chips/non mint and menthol or chocalate containing candies (such as lifesavers or sugarless jolly ranchers) are also effective.  You should rest your voice and avoid activities that you know make you cough.  Once you have eliminated the cough for 3 straight days try reducing the tramadol first,  then the delsym as tolerated.     Work on inhaler technique:  relax and gently blow all the way out then take a nice smooth deep breath back in, triggering the inhaler at same time you start breathing in.  Hold for up to 5 seconds if you can. Blow out thru nose. Rinse and gargle with water when done   Please see patient coordinator before you leave today  to schedule sinus CT asap   Please remember to go to the  x-ray department downstairs in the basement  for your tests - we will call you with the results when they are available.      Try prilosec otc 20mg  x 2  Take 30-60 min before first meal of the day and Pepcid ac (famotidine) 20 mg one @  bedtime until cough is completely gone for at least a week without the need for cough suppression   Please schedule a follow up office visit in 2 weeks, sooner if needed  with all medications /inhalers/ solutions in hand so we can verify exactly what you are taking. This includes all medications from all doctors and over the counters  - To see Tammy NP on return

## 2017-09-06 NOTE — Telephone Encounter (Signed)
Called pt and left detailed message, Rx will be mailed to her address as requested.

## 2017-09-09 ENCOUNTER — Encounter: Payer: Self-pay | Admitting: Internal Medicine

## 2017-09-10 ENCOUNTER — Encounter: Payer: Medicare Other | Admitting: Adult Health

## 2017-09-18 ENCOUNTER — Ambulatory Visit: Payer: Medicare Other | Admitting: Internal Medicine

## 2017-09-18 ENCOUNTER — Ambulatory Visit (INDEPENDENT_AMBULATORY_CARE_PROVIDER_SITE_OTHER): Payer: Medicare Other | Admitting: *Deleted

## 2017-09-18 DIAGNOSIS — Z5181 Encounter for therapeutic drug level monitoring: Secondary | ICD-10-CM

## 2017-09-18 DIAGNOSIS — I4891 Unspecified atrial fibrillation: Secondary | ICD-10-CM | POA: Diagnosis not present

## 2017-09-18 DIAGNOSIS — I482 Chronic atrial fibrillation, unspecified: Secondary | ICD-10-CM

## 2017-09-18 LAB — POCT INR: INR: 2.9

## 2017-09-18 NOTE — Patient Instructions (Signed)
Description   Continue taking 1 tablet every day except 1/2 tablet on Sundays, Tuesdays, and Fridays. Call us with any medication changes or concerns, [phone]. Continue to do 2 servings of dark leafy greens a week. Recheck in 4 weeks.

## 2017-09-20 ENCOUNTER — Encounter: Payer: Medicare Other | Admitting: Adult Health

## 2017-09-24 ENCOUNTER — Ambulatory Visit (INDEPENDENT_AMBULATORY_CARE_PROVIDER_SITE_OTHER): Payer: Medicare Other | Admitting: Internal Medicine

## 2017-09-24 ENCOUNTER — Encounter: Payer: Self-pay | Admitting: Internal Medicine

## 2017-09-24 VITALS — BP 171/83 | HR 72 | Ht 66.0 in | Wt 192.0 lb

## 2017-09-24 DIAGNOSIS — I5032 Chronic diastolic (congestive) heart failure: Secondary | ICD-10-CM | POA: Diagnosis not present

## 2017-09-24 DIAGNOSIS — I482 Chronic atrial fibrillation: Secondary | ICD-10-CM | POA: Diagnosis not present

## 2017-09-24 DIAGNOSIS — I4821 Permanent atrial fibrillation: Secondary | ICD-10-CM

## 2017-09-24 MED ORDER — NEBIVOLOL HCL 10 MG PO TABS: 10.0000 mg | ORAL_TABLET | Freq: Every day | ORAL | 3 refills | Status: DC

## 2017-09-24 MED ORDER — OLMESARTAN MEDOXOMIL 40 MG PO TABS: 40.0000 mg | ORAL_TABLET | Freq: Every day | ORAL | 3 refills | Status: DC

## 2017-09-24 MED ORDER — DILTIAZEM HCL ER COATED BEADS 180 MG PO CP24: 180.0000 mg | ORAL_CAPSULE | Freq: Every evening | ORAL | 3 refills | Status: DC

## 2017-09-24 NOTE — Progress Notes (Signed)
Patient Care Team: [name], MD as PCP - General (Family Medicine) Deboraha Sprang, MD (Cardiology) Sable Feil, MD (Gastroenterology) Brand Males, MD (Pulmonary Disease) Renato Shin, MD (Endocrinology) Erline Levine, MD (Neurosurgery) Weber Cooks, MD (Inactive) (Orthopedic Surgery) Rozetta Nunnery, MD (Otolaryngology)   HPI  Stephanie Cortez is a 84 y.o. female Seen in followup for permanent atrial fibrillation having failed multiple cardioversions and antiarrhythmic drugs.  She is maintained on coumadin  She was admitted 12/14  [place]  because of chest pain elevated blood pressure and   atrial fibrillation with a rapid rate. She underwent a catheterization which was nonobstructive apparently. An echocardiogram was also normal except for left atrial enlargement.    Sh has been diagnosed with ILD and is followed by Dr Chase Caller she has been dealing with a bronchitis for the last 6-8 weeks.  Blood pressures been relatively stable on her home regime with numbers in the 130-40 range  Difficult to manage blood pressures been assisted blood pressure clinic/pharmacy.  No chest pain shortness of breath peripheral edema nocturnal dyspnea orthopnea   Her sister is nearing her end  assisted living.  Date Cr K Hgb  1/19 0.76 4.8 12.7           Past Medical History:  Diagnosis Date  . Anemia    - Hgb 9.7gm% on 07/13/2008 in [place] -  Hgg 129gm% wiht normal irone levsl and ferritin 10/27/2008 in [place] Recurrent otitis/sinusitis  . Anxiety    chronic BZ prn  . Atrial fibrillation (HCC)    chronic anticoag  . Bronchiectasis    >PFT 07/13/2008 in [place] 1.9L/76%, FVC 2.45L/74, Ratio 79, TLC 121%, DLCO 64%  AE BRonchiectasis - Dec 2010.New Rx:  outpatient - Feb 2011 - Rx outpatient  . CHF (congestive heart failure) ([place])   . COPD (chronic obstructive pulmonary disease) (HCC)    bronchiectasis  . Cricopharyngeal achalasia   . Depression   .  Diverticulosis   . Dyslipidemia   . Eczema   . GERD with stricture   . Glaucoma   . H. pylori infection   . Hypertension   . Hyponatremia    chronic, s/p endo eval 06/2012  . Hypothyroid   . IBS (irritable bowel syndrome)   . Lumbar disc disease   . Segmental colitis ([place])   . Vertigo     Past Surgical History:  Procedure Laterality Date  . BREAST SURGERY     br bx  . CARDIAC CATHETERIZATION    . CATARACT EXTRACTION     both  . COLONOSCOPY    . ESOPHAGOSCOPY W/ BOTOX INJECTION  12/11/2011   Procedure: ESOPHAGOSCOPY WITH BOTOX INJECTION;  Surgeon: Rozetta Nunnery, MD;  Location: [facility];  Service: ENT;  Laterality: N/A;  esophogoscopy with dilation, botox injection  . FOOT SURGERY  03/14/2011   gastroc slide-rt  . TOTAL ABDOMINAL HYSTERECTOMY      Current Outpatient Medications  Medication Sig Dispense Refill  . acetaminophen (TYLENOL 8 HOUR) 650 MG CR tablet Take 650 mg by mouth every 8 (eight) hours as needed (for severe back pain.).    [name] Kitchen acetaminophen (TYLENOL) 500 MG tablet Take 500-1,000 mg by mouth every 4 (four) hours as needed (for mild to moderate pain.).    [name] Kitchen budesonide-formoterol (SYMBICORT) 80-4.5 MCG/ACT inhaler Inhale 2 puffs into the lungs 2 (two) times daily. 10.2 g 0  . Calcium-Magnesium-Zinc (CAL-MAG-ZINC PO) Take 1 tablet by mouth 3 (  three) times daily.    . cetirizine (ZYRTEC) 10 MG tablet Take 10 mg by mouth daily as needed (for allergies).     . Cholecalciferol (VITAMIN D-3) 1000 units CAPS Take 4,000 Units by mouth daily.    . Cyanocobalamin (VITAMIN B-12 PO) Take 1 tablet by mouth daily.    [name] Kitchen dextromethorphan-guaiFENesin (MUCINEX DM) 30-600 MG 12hr tablet Take 1-2 tablets by mouth 2 (two) times daily as needed (for allegies/respiratory issues.).    [name] Kitchen diltiazem (CARDIZEM CD) 180 MG 24 hr capsule Take 1 capsule (180 mg total) by mouth every evening. 90 capsule 3  . famotidine (PEPCID) 20 MG tablet One at bedtime    . fluticasone  (FLONASE) 50 MCG/ACT nasal spray Place 1 spray into both nostrils 2 (two) times daily as needed (for nasal congestion).    [name] Kitchen levothyroxine (SYNTHROID, LEVOTHROID) 50 MCG tablet Take 1 tablet (50 mcg total) by mouth daily. 90 tablet 3  . nebivolol (BYSTOLIC) 10 MG tablet Take 1 tablet (10 mg total) by mouth daily. 90 tablet 3  . olmesartan (BENICAR) 40 MG tablet Take 1 tablet (40 mg total) by mouth daily. 90 tablet 3  . omeprazole (PRILOSEC) 20 MG capsule TAKE 1 CAPSULE(20 MG) BY MOUTH DAILY 90 capsule 4  . POTASSIUM PO Take 1 tablet by mouth daily.    . pravastatin (PRAVACHOL) 40 MG tablet Take 40 mg by mouth daily.    . predniSONE (DELTASONE) 10 MG tablet Take  4 each am x 2 days,   2 each am x 2 days,  1 each am x 2 days and stop 14 tablet 0  . SELENIUM PO Take 1 tablet by mouth daily.    . traMADol (ULTRAM) 50 MG tablet 1-2 every 4 hours as needed for cough or pain 40 tablet 0  . warfarin (COUMADIN) 2.5 MG tablet Take 1/2-1 tablet by mouth daily as directed by [facility] (Patient taking differently: Take 1.25-2.5 mg by mouth See admin instructions. Take 1 tablet (2.5 mg)  Monday, Wednesday,  Thursday & Saturday; Take 0.5 tablet (1.25 mg) Tuesday, Friday, & Sunday) 90 tablet 1   No current facility-administered medications for this visit.     Allergies  Allergen Reactions  . Doxycycline     Rash on face and neck  . Flagyl [Metronidazole] Nausea And Vomiting  . Moxifloxacin Other (See Comments)     Weakness/fatigue  . Pantoprazole Sodium Rash  . Penicillins Rash    Has patient had a PCN reaction causing immediate rash, facial/tongue/throat swelling, SOB or lightheadedness with hypotension:unsure Has patient had a PCN reaction causing severe rash involving mucus membranes or skin necrosis:unsure Has patient had a PCN reaction that required hospitalization:No Has patient had a PCN reaction occurring within the last 10 years:Yes Cannot recall exact reaction due to time lapse If all  of the above answers are "NO", then may proceed with Cephalosporin use.     Review of Systems negative except from HPI and PMH  Physical Exam BP (!) 171/83   Pulse 72   Ht 5\' 6"  (1.676 m)   Wt 192 lb (87.1 kg)   SpO2 98%   BMI 30.99 kg/m  Well developed and nourished in no acute distress HENT normal Neck supple with JVP-flat Clear Irregularly irregular rate and rhythm, no murmurs or gallops Abd-soft with active BS No Clubbing cyanosis edema Skin-warm and dry A & Oriented  Grossly normal sensory and motor function  ECG  afib @ 76 -/13/43   Atrial fibrillation-permanent  HFpEF  Hypertension  Blood pressures at home have been in the 130 range.  Will continue current medications.  Euvolemic continue current meds'  On Anticoagulation;  No bleeding issues    We spent more than 50% of our >25 min visit in face to face counseling regarding the above      We spent more than 50% of our >25 min visit in face to face counseling regarding the above

## 2017-09-26 ENCOUNTER — Ambulatory Visit (INDEPENDENT_AMBULATORY_CARE_PROVIDER_SITE_OTHER): Payer: Medicare Other | Admitting: Adult Health

## 2017-09-26 ENCOUNTER — Encounter: Payer: Self-pay | Admitting: Adult Health

## 2017-09-26 VITALS — BP 126/82 | HR 87 | Ht 66.0 in | Wt 191.0 lb

## 2017-09-26 DIAGNOSIS — J849 Interstitial pulmonary disease, unspecified: Secondary | ICD-10-CM | POA: Diagnosis not present

## 2017-09-26 DIAGNOSIS — J329 Chronic sinusitis, unspecified: Secondary | ICD-10-CM

## 2017-09-26 DIAGNOSIS — J45991 Cough variant asthma: Secondary | ICD-10-CM | POA: Diagnosis not present

## 2017-09-26 NOTE — Progress Notes (Signed)
@Patient  ID: Stephanie Cortez, female    DOB: 01-10-1934, 84 y.o.   MRN: [id number]  Chief Complaint  Patient presents with  . Follow-up    Cough     Referring provider: [name], MD  HPI: 84 yo female never smoker followed for ILD   TEST Spirometry today 09/26/2015 shows FVC 1.55 L/54%, FEV1 1.28 L/59% and a ratio of 83. This comes 6 weeks of her short prednisone burst. This a 300 mL decline in FVC since August 2016 but an improvement by 170 mL since June 2016   6 minute walk test today: Walk 336 m. Resting pulse ox was 98%. Peak exertion pulse ox was 94%. She had back pain but not shortness of breath.  Last CT scan of the chest was June 2016 which showed NSIP pattern  Last CT scan of the sinuses August 2013 that showed mild mucosal edema in the paranasal sinuses  09/26/2017 Follow up : ILD /Chronic cough  Patient returns for a 2-week follow-up.  Patient was seen last visit with a slow to resolve bronchitic exacerbation.  She was initially treated with 10 days of Omnicef and a prednisone taper.  CT sinus on September 03, 2017 showed mild mucosal edema bilaterally with a small air-fluid level in the sphenoid sinus.  He was referred to ENT . Seen Dr. Redmond Baseman, records reviewed in care everywhere. She was given additional 10 days of Omnicef. She is feeling better. Cough and congestion and sinus drainage are improved.  Feels she is getting back to her baseline.  We reviewed all her meds and organized them into a med calendar with pt education. Appears to be taking correctly       Allergies  Allergen Reactions  . Doxycycline     Rash on face and neck  . Flagyl [Metronidazole] Nausea And Vomiting  . Moxifloxacin Other (See Comments)     Weakness/fatigue  . Pantoprazole Sodium Rash  . Penicillins Rash    Has patient had a PCN reaction causing immediate rash, facial/tongue/throat swelling, SOB or lightheadedness with hypotension:unsure Has patient had a PCN reaction causing  severe rash involving mucus membranes or skin necrosis:unsure Has patient had a PCN reaction that required hospitalization:No Has patient had a PCN reaction occurring within the last 10 years:Yes Cannot recall exact reaction due to time lapse If all of the above answers are "NO", then may proceed with Cephalosporin use.     Immunization History  Administered Date(s) Administered  . Influenza Split 04/06/2011, 04/03/2012  . Influenza Whole 06/01/2009, 04/03/2010  . Influenza, High Dose Seasonal PF 04/04/2017  . Influenza,inj,Quad PF,6+ Mos 03/10/2013, 03/24/2015  . Influenza-Unspecified 04/01/2014  . Pneumococcal Polysaccharide-23 07/04/2006    Past Medical History:  Diagnosis Date  . Anemia    - Hgb 9.7gm% on 07/13/2008 in [place] -  Hgg 129gm% wiht normal irone levsl and ferritin 10/27/2008 in [place] Recurrent otitis/sinusitis  . Anxiety    chronic BZ prn  . Atrial fibrillation (HCC)    chronic anticoag  . Bronchiectasis    >PFT 07/13/2008 in [place] 1.9L/76%, FVC 2.45L/74, Ratio 79, TLC 121%, DLCO 64%  AE BRonchiectasis - Dec 2010.New Rx:  outpatient - Feb 2011 - Rx outpatient  . CHF (congestive heart failure) ([place])   . COPD (chronic obstructive pulmonary disease) (HCC)    bronchiectasis  . Cricopharyngeal achalasia   . Depression   . Diverticulosis   . Dyslipidemia   . Eczema   . GERD with stricture   .  Glaucoma   . H. pylori infection   . Hypertension   . Hyponatremia    chronic, s/p endo eval 06/2012  . Hypothyroid   . IBS (irritable bowel syndrome)   . Lumbar disc disease   . Segmental colitis ([place])   . Vertigo     Tobacco History: Social History   Tobacco Use  Smoking Status Never Smoker  Smokeless Tobacco Never Used   Counseling given: Not Answered   Outpatient Encounter Medications as of 09/26/2017  Medication Sig  . acetaminophen (TYLENOL 8 HOUR) 650 MG CR tablet Take 650 mg by mouth every 8 (eight) hours as needed (for severe back pain.).  [name] Kitchen  acetaminophen (TYLENOL) 500 MG tablet Take 500-1,000 mg by mouth every 4 (four) hours as needed (for mild to moderate pain.).  [name] Kitchen budesonide-formoterol (SYMBICORT) 80-4.5 MCG/ACT inhaler Inhale 2 puffs into the lungs 2 (two) times daily.  . Calcium-Magnesium-Zinc (CAL-MAG-ZINC PO) Take 1 tablet by mouth 3 (three) times daily.  . cetirizine (ZYRTEC) 10 MG tablet Take 10 mg by mouth daily as needed (for allergies).   . Cholecalciferol (VITAMIN D-3) 1000 units CAPS Take 4,000 Units by mouth daily.  . Cyanocobalamin (VITAMIN B-12 PO) Take 1 tablet by mouth daily.  [name] Kitchen dextromethorphan-guaiFENesin (MUCINEX DM) 30-600 MG 12hr tablet Take 1-2 tablets by mouth 2 (two) times daily as needed (for allegies/respiratory issues.).  [name] Kitchen diltiazem (CARDIZEM CD) 180 MG 24 hr capsule Take 1 capsule (180 mg total) by mouth every evening.  . famotidine (PEPCID) 20 MG tablet One at bedtime  . fluticasone (FLONASE) 50 MCG/ACT nasal spray Place 1 spray into both nostrils 2 (two) times daily as needed (for nasal congestion).  [name] Kitchen levothyroxine (SYNTHROID, LEVOTHROID) 50 MCG tablet Take 1 tablet (50 mcg total) by mouth daily.  . nebivolol (BYSTOLIC) 10 MG tablet Take 1 tablet (10 mg total) by mouth daily.  [name] Kitchen olmesartan (BENICAR) 40 MG tablet Take 1 tablet (40 mg total) by mouth daily.  [name] Kitchen omeprazole (PRILOSEC) 20 MG capsule TAKE 1 CAPSULE(20 MG) BY MOUTH DAILY  . POTASSIUM PO Take 1 tablet by mouth daily.  . pravastatin (PRAVACHOL) 40 MG tablet Take 40 mg by mouth daily.  . predniSONE (DELTASONE) 10 MG tablet Take  4 each am x 2 days,   2 each am x 2 days,  1 each am x 2 days and stop  . SELENIUM PO Take 1 tablet by mouth daily.  . traMADol (ULTRAM) 50 MG tablet 1-2 every 4 hours as needed for cough or pain  . warfarin (COUMADIN) 2.5 MG tablet Take 1/2-1 tablet by mouth daily as directed by [facility] (Patient taking differently: Take 1.25-2.5 mg by mouth See admin instructions. Take 1 tablet (2.5 mg)  Monday, Wednesday,   Thursday & Saturday; Take 0.5 tablet (1.25 mg) Tuesday, Friday, & Sunday)   No facility-administered encounter medications on file as of 09/26/2017.      Review of Systems  Constitutional:   No  weight loss, night sweats,  Fevers, chills, fatigue, or  lassitude.  HEENT:   No headaches,  Difficulty swallowing,  Tooth/dental problems, or  Sore throat,                No sneezing, itching, ear ache,  +nasal congestion, post nasal drip,   CV:  No chest pain,  Orthopnea, PND, swelling in lower extremities, anasarca, dizziness, palpitations, syncope.   GI  No heartburn, indigestion, abdominal pain, nausea, vomiting, diarrhea, change in bowel habits, loss of appetite, bloody  stools.   Resp:    No chest wall deformity  Skin: no rash or lesions.  GU: no dysuria, change in color of urine, no urgency or frequency.  No flank pain, no hematuria   MS:  No joint pain or swelling.  No decreased range of motion.  No back pain.    Physical Exam  BP 126/82 (BP Location: Left Arm, Cuff Size: Normal)   Pulse 87   Ht 5\' 6"  (1.676 m)   Wt 191 lb (86.6 kg)   SpO2 97%   BMI 30.83 kg/m   GEN: A/Ox3; pleasant , NAD, elderly    HEENT:  [place]/AT,  EACs-clear, TMs-wnl, NOSE-clear, THROAT-clear, no lesions, no postnasal drip or exudate noted.   NECK:  Supple w/ fair ROM; no JVD; normal carotid impulses w/o bruits; no thyromegaly or nodules palpated; no lymphadenopathy.    RESP  Clear  P & A; w/o, wheezes/ rales/ or rhonchi. no accessory muscle use, no dullness to percussion  CARD:  RRR, no m/r/g, no peripheral edema, pulses intact, no cyanosis or clubbing.  GI:   Soft & nt; nml bowel sounds; no organomegaly or masses detected.   Musco: Warm bil, no deformities or joint swelling noted.   Neuro: alert, no focal deficits noted.    Skin: Warm, no lesions or rashes    Lab Results:  CBC  No results found for: BNP  ProBNP No results found for: PROBNP  Imaging: Dg Chest 2 View  Result  Date: 08/28/2017 CLINICAL DATA:  Cough, wheezing, history of interstitial lung disease EXAM: CHEST  2 VIEW COMPARISON:  High-resolution CT chest dated 12/26/2015 FINDINGS: Subpleural reticulation/fibrosis in the bilateral upper lobes and left lower lobe, compatible with the history of interstitial lung disease. Calcified granuloma in the lateral left upper lobe. No focal consolidation. No pleural effusion or pneumothorax. Cardiomegaly. Mild degenerative changes of the visualized thoracolumbar spine. IMPRESSION: No evidence of acute cardiopulmonary disease. Electronically Signed   By: Julian Hy M.D.   On: 08/28/2017 09:13   Ct Maxillofacial Ltd Wo Cm  Result Date: 09/03/2017 CLINICAL DATA:  Upper airway cough syndrome EXAM: CT PARANASAL SINUS LIMITED WITHOUT CONTRAST TECHNIQUE: Non-contiguous multidetector CT images of the paranasal sinuses were obtained in a single plane without contrast. COMPARISON:  CT sinus 02/25/2012 FINDINGS: Mild mucosal edema in the maxillary sinuses bilaterally. Small air-fluid level right sphenoid sinus. Nasal passages clear. Mild nasal septal deviation to the left. No acute skeletal abnormality. Regional soft tissues negative. IMPRESSION: Mild mucosal edema maxillary sinus bilaterally. Small air-fluid level sphenoid sinus. Electronically Signed   By: Franchot Gallo M.D.   On: 09/03/2017 16:20     Assessment & Plan:   ILD (interstitial lung disease) NSIP - recent flare with bronchitis and sinusitis  Now improving  Check PFT w/ DLCO on return   Plan  Patient Instructions  Continue on current regimen .  Follow med calendar closely and bring to each visit.  Follow up with Dr. Chase Caller in 2 months with PFT w/ DLCO .  Please contact office for sooner follow up if symptoms do not improve or worsen or seek emergency care       Chronic sinusitis Recent flare now resolved   Plan  Patient Instructions  Continue on current regimen .  Follow med calendar closely  and bring to each visit.  Follow up with Dr. Chase Caller in 2 months with PFT w/ DLCO .  Please contact office for sooner follow up if symptoms do not improve  or worsen or seek emergency care          Rexene Edison, NP 09/26/2017

## 2017-09-26 NOTE — Patient Instructions (Addendum)
Continue on current regimen .  Follow med calendar closely and bring to each visit.  Follow up with Dr. Chase Caller in 2 months with PFT w/ DLCO .  Please contact office for sooner follow up if symptoms do not improve or worsen or seek emergency care

## 2017-09-26 NOTE — Assessment & Plan Note (Signed)
NSIP - recent flare with bronchitis and sinusitis  Now improving  Check PFT w/ DLCO on return   Plan  Patient Instructions  Continue on current regimen .  Follow med calendar closely and bring to each visit.  Follow up with Dr. Chase Caller in 2 months with PFT w/ DLCO .  Please contact office for sooner follow up if symptoms do not improve or worsen or seek emergency care

## 2017-09-26 NOTE — Assessment & Plan Note (Signed)
Recent flare now resolved   Plan  Patient Instructions  Continue on current regimen .  Follow med calendar closely and bring to each visit.  Follow up with Dr. Chase Caller in 2 months with PFT w/ DLCO .  Please contact office for sooner follow up if symptoms do not improve or worsen or seek emergency care

## 2017-09-30 ENCOUNTER — Telehealth: Payer: Self-pay | Admitting: Internal Medicine

## 2017-09-30 NOTE — Telephone Encounter (Signed)
Pt's pharmacy [facility] calling stating that pt's medication olmesartan is on back order and would like to know if Dr. Caryl Comes could change this medication to something else. Please address.

## 2017-10-02 MED ORDER — TELMISARTAN 40 MG PO TABS: 40.0000 mg | ORAL_TABLET | Freq: Every day | ORAL | 3 refills | Status: DC

## 2017-10-02 NOTE — Telephone Encounter (Signed)
Spoke with pt pharmacy regarding Olmesartan back order. Per Pharmacy, we will change her to Telmisartan 40mg  qd. Pt is to monitor BP daily for a few weeks to look for any changes.   Spoke with patient and she is in agreement to medication change and states she will monitor her BP frequently with the medication change.

## 2017-10-02 NOTE — Telephone Encounter (Signed)
Yes - would recommend change to telmisartan 40mg  daily. If patient is able have her monitor her pressures for 3-4 weeks after change and call with any changes.

## 2017-10-07 NOTE — Addendum Note (Signed)
Addended by: Della Goo C on: 10/07/2017 10:43 AM   Modules accepted: Orders

## 2017-10-16 ENCOUNTER — Ambulatory Visit (INDEPENDENT_AMBULATORY_CARE_PROVIDER_SITE_OTHER): Payer: Medicare Other | Admitting: Pharmacist

## 2017-10-16 DIAGNOSIS — I4891 Unspecified atrial fibrillation: Secondary | ICD-10-CM | POA: Diagnosis not present

## 2017-10-16 DIAGNOSIS — Z5181 Encounter for therapeutic drug level monitoring: Secondary | ICD-10-CM

## 2017-10-16 DIAGNOSIS — I482 Chronic atrial fibrillation, unspecified: Secondary | ICD-10-CM

## 2017-10-16 LAB — POCT INR: INR: 2.6

## 2017-10-16 NOTE — Patient Instructions (Signed)
Description   Continue taking 1 tablet every day except 1/2 tablet on Sundays, Tuesdays, and Fridays. Call us with any medication changes or concerns, [phone]. Continue to do 2 servings of dark leafy greens a week. Recheck in 5 weeks.

## 2017-10-21 DIAGNOSIS — I10 Essential (primary) hypertension: Secondary | ICD-10-CM | POA: Diagnosis not present

## 2017-10-21 DIAGNOSIS — Z6830 Body mass index (BMI) 30.0-30.9, adult: Secondary | ICD-10-CM | POA: Diagnosis not present

## 2017-10-21 DIAGNOSIS — M9983 Other biomechanical lesions of lumbar region: Secondary | ICD-10-CM | POA: Diagnosis not present

## 2017-10-23 ENCOUNTER — Other Ambulatory Visit: Payer: Self-pay | Admitting: Internal Medicine

## 2017-10-31 DIAGNOSIS — F439 Reaction to severe stress, unspecified: Secondary | ICD-10-CM | POA: Diagnosis not present

## 2017-10-31 DIAGNOSIS — E871 Hypo-osmolality and hyponatremia: Secondary | ICD-10-CM | POA: Diagnosis not present

## 2017-11-15 DIAGNOSIS — H9313 Tinnitus, bilateral: Secondary | ICD-10-CM | POA: Diagnosis not present

## 2017-11-15 DIAGNOSIS — H903 Sensorineural hearing loss, bilateral: Secondary | ICD-10-CM | POA: Diagnosis not present

## 2017-11-18 DIAGNOSIS — I10 Essential (primary) hypertension: Secondary | ICD-10-CM | POA: Diagnosis not present

## 2017-11-18 DIAGNOSIS — Z6829 Body mass index (BMI) 29.0-29.9, adult: Secondary | ICD-10-CM | POA: Diagnosis not present

## 2017-11-18 DIAGNOSIS — M48062 Spinal stenosis, lumbar region with neurogenic claudication: Secondary | ICD-10-CM | POA: Diagnosis not present

## 2017-11-18 DIAGNOSIS — M4316 Spondylolisthesis, lumbar region: Secondary | ICD-10-CM | POA: Diagnosis not present

## 2017-11-18 DIAGNOSIS — M4727 Other spondylosis with radiculopathy, lumbosacral region: Secondary | ICD-10-CM | POA: Diagnosis not present

## 2017-11-18 DIAGNOSIS — M9983 Other biomechanical lesions of lumbar region: Secondary | ICD-10-CM | POA: Diagnosis not present

## 2017-11-18 DIAGNOSIS — M5117 Intervertebral disc disorders with radiculopathy, lumbosacral region: Secondary | ICD-10-CM | POA: Diagnosis not present

## 2017-11-18 DIAGNOSIS — M5136 Other intervertebral disc degeneration, lumbar region: Secondary | ICD-10-CM | POA: Diagnosis not present

## 2017-11-20 ENCOUNTER — Encounter: Payer: Self-pay | Admitting: Internal Medicine

## 2017-11-20 ENCOUNTER — Ambulatory Visit (INDEPENDENT_AMBULATORY_CARE_PROVIDER_SITE_OTHER): Payer: Medicare Other | Admitting: *Deleted

## 2017-11-20 ENCOUNTER — Ambulatory Visit (INDEPENDENT_AMBULATORY_CARE_PROVIDER_SITE_OTHER): Payer: Medicare Other | Admitting: Internal Medicine

## 2017-11-20 VITALS — BP 128/78 | HR 50 | Ht 66.0 in | Wt 191.8 lb

## 2017-11-20 DIAGNOSIS — Z5181 Encounter for therapeutic drug level monitoring: Secondary | ICD-10-CM

## 2017-11-20 DIAGNOSIS — I482 Chronic atrial fibrillation, unspecified: Secondary | ICD-10-CM

## 2017-11-20 DIAGNOSIS — I4891 Unspecified atrial fibrillation: Secondary | ICD-10-CM

## 2017-11-20 DIAGNOSIS — J849 Interstitial pulmonary disease, unspecified: Secondary | ICD-10-CM | POA: Diagnosis not present

## 2017-11-20 DIAGNOSIS — J45991 Cough variant asthma: Secondary | ICD-10-CM | POA: Diagnosis not present

## 2017-11-20 LAB — POCT INR: INR: 2.8 (ref 2.0–3.0)

## 2017-11-20 NOTE — Patient Instructions (Signed)
Description   Continue taking 1 tablet every day except 1/2 tablet on Sundays, Tuesdays, and Fridays. Call us with any medication changes or concerns, [phone]. Continue to do 2 servings of dark leafy greens a week. Recheck in 6 weeks.

## 2017-11-20 NOTE — Patient Instructions (Signed)
ICD-10-CM   1. ILD (interstitial lung disease) ([place]) J84.9   2. Cough variant asthma J45.991    Glad you are stable and  Improved and back to baseline from your flare up feb 2019 Glad symbicort continues to work Basis of ILD (aka pulmonary fibrosis) not known but is stable over time  Plan contnue  symbicort and flonase scheduled Allbuterol as needed Do Pre-bd spiro and dlco only. No lung volume or bd response. No post-bd spiro  - do this oct 2019  Followup - oct 2019 after CT -earlier if needed

## 2017-11-20 NOTE — Progress Notes (Signed)
Subjective:     Patient ID: Stephanie Cortez, female   DOB: August 06, 1933, 84 y.o.   MRN: [id number]  HPI  OV 03/24/2015  Chief Complaint  Patient presents with  . Follow-up    Pt here after PFT in 01/2015. Pt using nocturnal O2 and has noticed a difference the next day in her breathing. Pt states she has a prod cough with discolored mucus, PND, sinus pressure.    Stephanie Cortez is here for follow-up with her daughter Stephanie Cortez, they are presenting for review of ILD workup. I'd always followed up with her baseline diagnosis of bronchiectasis but it started becoming more apparent that she actually has ILD. 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 According to the radiologist it has been stable since 2014 at least. Autoimmune workup showed elevated angiotensin-converting enzyme and CK but otherwise normal. Since the diagnosis of elevated CK her primary care physician has stopped her statin. There is no follow-up CK on this. We also notices that she had nocturnal desaturations restarted her on oxygen and sent her to pulmonary rehabilitation. These 2 measures haven't helped her immensely. She still continues a Symbicort for the prior diagnosis of bronchiectasis. She is inclined to continue that. She will have a flu shot today. She wants to know more about interstitial lung disease and had lot of questions about it  OV  08/11/2015: Acute office visit:  Patient presents to the office today with complaint of chest congestion and cough with yellow mucus intermittent wheezing and shortness of breath that has been ongoing for about 2-3 weeks, but per patient this has worsened over the last 2-3 days. Patient denies fever, chills, chest pain or hemoptysis. Patient endorses scant bloody mucus from her nose, that she feels this is probably related to her nocturnal oxygen use. She presented to her primary care physician Stephanie Cortez at [facility] and was treated initially with a Z-Pak. When  Z-Pak did not resolve patient's symptoms she was then treated with clarithromycin 500 mg twice daily starting 07/25/2015 for a 10 day treatment period . She is currently still taking this antibiotic. States since starting the clarithromycin that she has noticed some improvement.    OV 09/26/2015  Chief Complaint  Patient presents with  . Follow-up    Pt here after seeing SG on 08/10/15 for an acute visit. Pt here after 27mwt and PFT. Pt states she is still not feeling well. Pt c/o headache, hoarseness, wheezing, prod cough. Pt denies increase in SOB and f/c/s.    Routine office visit for this lady with ILD/NSIP pattern (previous diagnosis of bronchiectasis and on Symbicort]. Has associated sinus problems. Her daughter Stephanie Cortez is not with her today.   84 year old female presents for follow-up. She is frustrated by her chronic sinus drainage and chronic sinus tension  in the frontal area. This is been going on for many months a few years. She seen Dr. [name] in ENT and apparently is planning a procedure. In the last several days to a few weeks she's having increasing headache particularly in the last 3 days. However there is no increase in sinus drainage volume or change in color of the sinus drainage [baseline color is much reading between white and yellow and green]  However in the last day or 2 she's increasing cough and wheezing despite taking Symbicort.  She is frustrated by her symptoms. She wants a definite answer currently same time she is reluctant to have surgical lung  biopsy. She is also reluctant to have chronic daily prednisone because of side effects. She is also at the same time frustrated about her extreme level of curiosity and need to search the Internet about her disease condition and then this works her into an anxiety      Spirometry today 09/26/2015 shows FVC 1.55 L/54%, FEV1 1.28 L/59% and a ratio of 83. This comes 6 weeks of her short prednisone burst. This a 300 mL decline in  FVC since August 2016 but an improvement by 170 mL since June 2016   6 minute walk test today: Walk 336 m. Resting pulse ox was 98%. Peak exertion pulse ox was 94%. She had back pain but not shortness of breath.  Last CT scan of the chest was June 2016 which showed NSIP pattern  Last CT scan of the sinuses August 2013 that showed mild mucosal edema in the paranasal sinuses    OV 01/04/2016  Chief Complaint  Patient presents with  . Follow-up    Breathing has been doing well, same as usual.  Discuss oxygen, has not worn oxygen x 2 weeks.  patient only uses at night, discuss testing to get off oxygen.  Having surgery on spine in August, wants to discuss. Wheezing.    FU NSIP with air trapping and cough/wheeze - on symbicort (prior dx in 2015)  - Routine fu. Had annual HRCT - stable ILD with air trapping x 2 years. Overall well. Does not want to use nocturnal o2. But having wheeze and cough that is stable but says symbicort is not helping. Also, reports saysShe is having spine surgery to the low spine in the next few months by Dr [name] and she wants know her preoperative pulmonary risk. She also wants to know the basis of her interstitial lung disease which we have discussed in the past but at this point in time her main issues symptoms. Walking desaturation test 185 feet 3 laps on room at in the office she did not desaturate   OV 02/20/2016  Chief Complaint  Patient presents with  . Acute Visit    Pt c/o prod cough with yellow mucus with scant amount of blood, increase in SOB, wheezing. Pt states the spiriva makes her cough.    FU NSIP with air trapping and cough/wheeze - on symbicort (prior dx in 2015).  Last visit in July 2017 because of air trapping we took her off Symbicort and tried Spiriva and Brio she called on 02/17/2016 increasing cough. She was rotated back to Symbicort. She clearly attributes the worsening cough and symptoms to Spiriva. She feels that after going on  Symbicort she is better. However she is having some yellow mucus and feels she is having acute bronchitis. She has multiple drug allergies listed below. Regarding her interstitial lung disease this is been stable for 2 years. Last visit walking desaturation test did not desaturate. She then had overnight oxygen study 02/07/2016 and results are below and she does not need nocturnal oxygen. Therefore she will not need sleep study  OV 04/04/2017  Chief Complaint  Patient presents with  . Follow-up    Pt states that she has been doing well. Breathing is at a stable point at this moment. Occ. cough and occ. SOB. Denies any CP.    Follow-up NSIP with a trapping and cough/wheeze and Symbicort responsive   83-year-ol overall stable. No new complaints. Not seen her in over a year. No interim health issues other than back pain for  which she is avoiding back surgery. She is in need of a flu shot. She feels Symbicort is working well for her and she wants to continue this. Without Symbicort she decompensated. Walking desaturation test 185 feet 3 laps on room air: Resting heart rate 63. Final 197. Resting pulse ox 100%. Final pulse ox 99%.   08/19/2017 acute extended ov/Wert re: acute cough in setting of flare of rhinitis? sinusitis Chief Complaint  Patient presents with  . Acute Visit    wheezing,SOB with activity,productive cough with green, thick sputum, has started mucinex   nasal symptoms flared  X one week prior to OV with facial pressure/ nasal congestion  Then worse cough/ wheeze with mucus turning green, some epistaxis but no green nasal d/c Not normally needing any kind of rescue rx / does not even have one  symptoms Worse hs and off and on during  noct / props up on several pillows helps some Not limited by breathing from desired activities  But very sedentary  rec Omnicef 300 mg twice daily x 10 days Prednisone 10 mg take  4 each am x 2 days,   2 each am x 2 days,  1 each am x 2 days and stop   I emphasized that nasal steroids (flonase) ave no immediate benefit in terms of improving symptoms.  To help them reached the target tissue, the patient should use Afrin two puffs every 12 hours  Work on perfecting inhaler technique:   - late add: should notify coumadin adviser re 10 day course of abx       08/27/2017  Acute extended  ov/Wert re: refractory cough/ wheeze Chief Complaint  Patient presents with  . Acute Visit    cough and wheezing are unchanged since last visit. She is coughing up some green sputum- still taking omnicef.    Dyspnea:  No really sob Cough: less in volume, still green, some from nose/mostly from chest/ wakes her up each am / worse also when head hit pillow  Sleep: poorly / 45 degrees SABA use:  N/a Has flutter valve not suing   OV 11/20/2017    Chief Complaint  Patient presents with  . Follow-up    Pt supposed to have PFT but was unable to show for it. Pt saw MW twice February 2019 due to a cough. Pt states her cough is now better. Has some mild SOB.    Follow-up NSIP with a trapping and cough/wheeze and Symbicort responsive    Follow-up interstitial lung disease associated with air trapping and Symbicort responsive.  She presents for follow-up.  Since I saw her last in October 2018 she presented 2 times in February 2019 acutely to this office and treated with Omnicef and prednisone and subsequently symptomatic treatment.  Followed up with nurse practitioner in March.  Pulmonary function test recommended for today's visit but because of delays in her previous appointment at the Coumadin clinic she could not get it done.  But at this point in time she feels her cough is resolved back to baseline she feels good.  She feels Symbicort is helping her a lot.  Walking desaturation test today is essentially close to normal other than mild desaturation to 3 points.  There are no new issues.  Simple office walk 185 feet x  3 laps goal with forehead probe  11/20/2017   O2 used Room air  Number laps completed 3 laps  Comments about pace Slow pace  Resting Pulse Ox/HR 100% and 58/min  Final Pulse Ox/HR 97% and 88/min  Desaturated </= 88% no  Desaturated <= 3% points yes  Got Tachycardic >/= 90/min no  Symptoms at end of test Mild dyspnea and back pain  Miscellaneous comments Mild antalgic gait   Results for JERMAINE, THOLL (MRN [id number]) as of 11/20/2017 11:40  Ref. Range 12/22/2014 10:11 03/02/2015 16:52 09/26/2015 10:04  FVC-Pre Latest Units: L 1.38 1.88 1.55  FVC-%Pred-Pre Latest Units: % 47 65 54   Results for ALTA, GODING (MRN [id number]) as of 11/20/2017 11:40  Ref. Range 12/22/2014 10:11 03/02/2015 16:52  DLCO unc Latest Units: ml/min/mmHg 17.07 16.90  DLCO unc % pred Latest Units: % 63 62   IMPRESSION: 1. Interstitial lung disease characterized by upper lobe predominant patchy peribronchovascular and subpleural reticulation and faint ground-glass attenuation with associated mild traction bronchiectasis. No frank honeycombing. Extensive air trapping. These findings have not appreciably changed and are most consistent with chronic hypersensitivity pneumonitis. These findings are not consistent with usual interstitial pneumonia (UIP). 2. Two vessel coronary atherosclerosis.  Stable mild cardiomegaly. 3. Aortic atherosclerosis.   Electronically Signed   By: Ilona Sorrel M.D.   On: 12/26/2015 15:44    has a past medical history of Anemia, Anxiety, Atrial fibrillation ([place]), Bronchiectasis, CHF (congestive heart failure) ([place]), COPD (chronic obstructive pulmonary disease) ([facility]), Cricopharyngeal achalasia, Depression, Diverticulosis, Dyslipidemia, Eczema, GERD with stricture, Glaucoma, H. pylori infection, Hypertension, Hyponatremia, Hypothyroid, IBS (irritable bowel syndrome), Lumbar disc disease, Segmental colitis ([place]), and Vertigo.   reports that she has never smoked. She has never used smokeless tobacco.  Past Surgical  History:  Procedure Laterality Date  . BREAST SURGERY     br bx  . CARDIAC CATHETERIZATION    . CATARACT EXTRACTION     both  . COLONOSCOPY    . ESOPHAGOSCOPY W/ BOTOX INJECTION  12/11/2011   Procedure: ESOPHAGOSCOPY WITH BOTOX INJECTION;  Surgeon: Rozetta Nunnery, MD;  Location: [facility];  Service: ENT;  Laterality: N/A;  esophogoscopy with dilation, botox injection  . FOOT SURGERY  03/14/2011   gastroc slide-rt  . TOTAL ABDOMINAL HYSTERECTOMY      Allergies  Allergen Reactions  . Doxycycline     Rash on face and neck  . Flagyl [Metronidazole] Nausea And Vomiting  . Moxifloxacin Other (See Comments)     Weakness/fatigue  . Pantoprazole Sodium Rash  . Penicillins Rash    Has patient had a PCN reaction causing immediate rash, facial/tongue/throat swelling, SOB or lightheadedness with hypotension:unsure Has patient had a PCN reaction causing severe rash involving mucus membranes or skin necrosis:unsure Has patient had a PCN reaction that required hospitalization:No Has patient had a PCN reaction occurring within the last 10 years:Yes Cannot recall exact reaction due to time lapse If all of the above answers are "NO", then may proceed with Cephalosporin use.     Immunization History  Administered Date(s) Administered  . Influenza Split 04/06/2011, 04/03/2012  . Influenza Whole 06/01/2009, 04/03/2010  . Influenza, High Dose Seasonal PF 04/04/2017  . Influenza,inj,Quad PF,6+ Mos 03/10/2013, 03/24/2015  . Influenza-Unspecified 04/01/2014  . Pneumococcal Polysaccharide-23 07/04/2006    Family History  Problem Relation Age of Onset  . Hypertension Mother   . Stroke Mother   . Emphysema Brother   . Other Father        miner's lung  . Cancer Father        Lung  . Colon polyps Sister   . Pancreatic cancer Sister   . Kidney disease Sister   .  Atrial fibrillation Unknown        siblings     Current Outpatient Medications:  .  budesonide-formoterol  (SYMBICORT) 80-4.5 MCG/ACT inhaler, Inhale 2 puffs into the lungs 2 (two) times daily., Disp: 10.2 g, Rfl: 0 .  Calcium-Magnesium-Zinc (CAL-MAG-ZINC PO), Take 1 tablet by mouth 3 (three) times daily., Disp: , Rfl:  .  Cholecalciferol (VITAMIN D-3) 1000 units CAPS, Take 4,000 Units by mouth daily., Disp: , Rfl:  .  Cyanocobalamin (VITAMIN B-12 PO), Take 1 tablet by mouth daily., Disp: , Rfl:  .  dextromethorphan-guaiFENesin (MUCINEX DM) 30-600 MG 12hr tablet, Take 1-2 tablets by mouth 2 (two) times daily as needed (for allegies/respiratory issues.)., Disp: , Rfl:  .  diltiazem (CARDIZEM CD) 180 MG 24 hr capsule, Take 1 capsule (180 mg total) by mouth every evening., Disp: 90 capsule, Rfl: 3 .  fluticasone (FLONASE) 50 MCG/ACT nasal spray, Place 1 spray into both nostrils 2 (two) times daily as needed (for nasal congestion)., Disp: , Rfl:  .  folic acid (FOLVITE) 1 MG tablet, Take 1 mg by mouth daily., Disp: , Rfl:  .  levothyroxine (SYNTHROID, LEVOTHROID) 50 MCG tablet, Take 1 tablet (50 mcg total) by mouth daily., Disp: 90 tablet, Rfl: 3 .  nebivolol (BYSTOLIC) 10 MG tablet, Take 1 tablet (10 mg total) by mouth daily., Disp: 90 tablet, Rfl: 3 .  olmesartan (BENICAR) 40 MG tablet, TAKE 1 TABLET ONCE DAILY., Disp: 30 tablet, Rfl: 12 .  omeprazole (PRILOSEC) 20 MG capsule, TAKE 1 CAPSULE(20 MG) BY MOUTH DAILY, Disp: 90 capsule, Rfl: 4 .  POTASSIUM PO, Take 1 tablet by mouth daily., Disp: , Rfl:  .  pravastatin (PRAVACHOL) 40 MG tablet, Take 40 mg by mouth daily., Disp: , Rfl:  .  warfarin (COUMADIN) 2.5 MG tablet, Take 1/2-1 tablet by mouth daily as directed by Coumadin Clinic (Patient taking differently: Take 1.25-2.5 mg by mouth See admin instructions. Take 1 tablet (2.5 mg)  Monday, Wednesday,  Thursday & Saturday; Take 0.5 tablet (1.25 mg) Tuesday, Friday, & Sunday), Disp: 90 tablet, Rfl: 1   Review of Systems     Objective:   Physical Exam  Constitutional: She is oriented to person, place,  and time. She appears well-developed and well-nourished. No distress.  HENT:  Head: Normocephalic and atraumatic.  Right Ear: External ear normal.  Left Ear: External ear normal.  Mouth/Throat: Oropharynx is clear and moist. No oropharyngeal exudate.  Eyes: Pupils are equal, round, and reactive to light. Conjunctivae and EOM are normal. Right eye exhibits no discharge. Left eye exhibits no discharge. No scleral icterus.  Neck: Normal range of motion. Neck supple. No JVD present. No tracheal deviation present. No thyromegaly present.  Cardiovascular: Normal rate, regular rhythm, normal heart sounds and intact distal pulses. Exam reveals no gallop and no friction rub.  No murmur heard. Pulmonary/Chest: Effort normal and breath sounds normal. No respiratory distress. She has no wheezes. She has no rales. She exhibits no tenderness.  Mild scattered crackles +  Abdominal: Soft. Bowel sounds are normal. She exhibits no distension and no mass. There is no tenderness. There is no rebound and no guarding.  Musculoskeletal: Normal range of motion. She exhibits no edema or tenderness.  Lymphadenopathy:    She has no cervical adenopathy.  Neurological: She is alert and oriented to person, place, and time. She has normal reflexes. No cranial nerve deficit. She exhibits normal muscle tone. Coordination normal.  Skin: Skin is warm and dry. No rash noted. She  is not diaphoretic. No erythema. No pallor.  Psychiatric: She has a normal mood and affect. Her behavior is normal. Judgment and thought content normal.  Vitals reviewed.  Vitals:   11/20/17 1131  BP: 128/78  Pulse: (!) 50  SpO2: 98%  Weight: 191 lb 12.8 oz (87 kg)  Height: 5\' 6"  (1.676 m)    Estimated body mass index is 30.96 kg/m as calculated from the following:   Height as of this encounter: 5\' 6"  (1.676 m).   Weight as of this encounter: 191 lb 12.8 oz (87 kg).       Assessment:       ICD-10-CM   1. ILD (interstitial lung disease)  ([place]) J84.9   2. Cough variant asthma J45.991        Plan:      Glad you are stable and  Improved and back to baseline from your flare up feb 2019 Glad symbicort continues to work Basis of ILD (aka pulmonary fibrosis) not known but is stable over time  Plan contnue  symbicort and flonase scheduled Allbuterol as needed Do Pre-bd spiro and dlco only. No lung volume or bd response. No post-bd spiro  - do this oct 2019  Followup - oct 2019 after CT -earlier if needed   Dr. Brand Males, M.D., [facility].C.P Pulmonary and Critical Care Medicine Staff Physician, [place] Director - Interstitial Lung Disease  Program  Pulmonary [place] at [place], [place], [address]  Pager: [phone], If no answer or between  15:00h - 7:00h: call [phone] Telephone: [phone]

## 2017-12-09 DIAGNOSIS — M5416 Radiculopathy, lumbar region: Secondary | ICD-10-CM | POA: Diagnosis not present

## 2017-12-09 DIAGNOSIS — R202 Paresthesia of skin: Secondary | ICD-10-CM | POA: Diagnosis not present

## 2017-12-09 DIAGNOSIS — F439 Reaction to severe stress, unspecified: Secondary | ICD-10-CM | POA: Diagnosis not present

## 2017-12-09 DIAGNOSIS — M25562 Pain in left knee: Secondary | ICD-10-CM | POA: Diagnosis not present

## 2017-12-09 DIAGNOSIS — I10 Essential (primary) hypertension: Secondary | ICD-10-CM | POA: Diagnosis not present

## 2017-12-10 ENCOUNTER — Telehealth: Payer: Self-pay | Admitting: Internal Medicine

## 2017-12-10 NOTE — Telephone Encounter (Signed)
New message    Patient will concerns about high BP readings. Requesting medication adjustment.  Patient does not take her blood pressure at home, she states she is afraid to take pressure.  Had an episode of chest pain about 1 month ago, she thought was related to reflux.    1. What are your last 5 BP readings? n/a  2. Are you having any other symptoms (ex. Dizziness, headache, blurred vision, passed out)? headache  3. What is your BP issue? High BP    1. Are you currently SOB (can you hear that pt is SOB on the phone)? no  2. How long have you been experiencing SOB? 2-3 months  3. Are you SOB when sitting or when up moving around? moving  4. Are you currently experiencing any other symptoms? headache

## 2017-12-10 NOTE — Telephone Encounter (Signed)
I spoke with pt today who states she has been unusually stressed and has had some recent increases in her BP. She does not take her BP regularly, only when she visits her doctors. I have advised her to begin taking them every day for a week a few hours after she takes her BP meds so we may trend her measurements. I suggested she try to do some relaxing exercises to help manage her stress. She agreed to plan and will contact me back later this week with her BP trends.

## 2017-12-12 ENCOUNTER — Telehealth: Payer: Self-pay | Admitting: Internal Medicine

## 2017-12-12 MED ORDER — SPIRONOLACTONE 25 MG PO TABS: 25.0000 mg | ORAL_TABLET | Freq: Every day | ORAL | 0 refills | Status: DC

## 2017-12-12 NOTE — Telephone Encounter (Signed)
Per Dr Caryl Comes, pt will need to follow up with her PCP regarding her HTN management. Dr Caryl Comes will be in and out of the office for the next few months and would like her to be followed regularly by her PCP. For now, Dr Caryl Comes would like her to begin Aldactone, 25mg , once daily until following up with her PCP. Pt agrees with plan and verbalized understanding.  Pt will hold her potassium supplements until following up with her PCP regarding medication managements.

## 2017-12-12 NOTE — Telephone Encounter (Signed)
New Message  Pt c/o BP issue: STAT if pt c/o blurred vision, one-sided weakness or slurred speech  1. What are your last 5 BP readings? 182/97   2. Are you having any other symptoms (ex. Dizziness, headache, blurred vision, passed out)? headache  3. What is your BP issue? Pt states her blood pressure is high and she doesn't want to do through the weekend without speaking with the nurse again

## 2017-12-19 DIAGNOSIS — M1712 Unilateral primary osteoarthritis, left knee: Secondary | ICD-10-CM | POA: Diagnosis not present

## 2017-12-19 DIAGNOSIS — M1711 Unilateral primary osteoarthritis, right knee: Secondary | ICD-10-CM | POA: Diagnosis not present

## 2018-01-09 DIAGNOSIS — I10 Essential (primary) hypertension: Secondary | ICD-10-CM | POA: Diagnosis not present

## 2018-01-09 DIAGNOSIS — M5416 Radiculopathy, lumbar region: Secondary | ICD-10-CM | POA: Diagnosis not present

## 2018-01-09 DIAGNOSIS — E039 Hypothyroidism, unspecified: Secondary | ICD-10-CM | POA: Diagnosis not present

## 2018-01-09 DIAGNOSIS — F419 Anxiety disorder, unspecified: Secondary | ICD-10-CM | POA: Diagnosis not present

## 2018-01-09 DIAGNOSIS — E785 Hyperlipidemia, unspecified: Secondary | ICD-10-CM | POA: Diagnosis not present

## 2018-01-13 ENCOUNTER — Ambulatory Visit (INDEPENDENT_AMBULATORY_CARE_PROVIDER_SITE_OTHER): Payer: Medicare Other | Admitting: *Deleted

## 2018-01-13 DIAGNOSIS — Z5181 Encounter for therapeutic drug level monitoring: Secondary | ICD-10-CM

## 2018-01-13 DIAGNOSIS — I482 Chronic atrial fibrillation, unspecified: Secondary | ICD-10-CM

## 2018-01-13 DIAGNOSIS — I4891 Unspecified atrial fibrillation: Secondary | ICD-10-CM | POA: Diagnosis not present

## 2018-01-13 DIAGNOSIS — I10 Essential (primary) hypertension: Secondary | ICD-10-CM | POA: Diagnosis not present

## 2018-01-13 LAB — POCT INR: INR: 3.2 — AB (ref 2.0–3.0)

## 2018-01-13 NOTE — Patient Instructions (Signed)
Description   Today take 1/2 tablet then continue taking 1 tablet every day except 1/2 tablet on Sundays, Tuesdays, and Fridays. Call us with any medication changes or concerns, [phone]. Continue to do 2 servings of dark leafy greens a week. Recheck in 3 weeks.

## 2018-01-29 DIAGNOSIS — I10 Essential (primary) hypertension: Secondary | ICD-10-CM | POA: Diagnosis not present

## 2018-02-03 ENCOUNTER — Telehealth: Payer: Self-pay | Admitting: *Deleted

## 2018-02-03 NOTE — Telephone Encounter (Signed)
Pt called to state she is going to try to get her Coumadin managed at [facility]. She doesn't have a PCP at [facility] her PCP is at [facility]. I contacted [facility] and they will not manage unless pt has PCP at their office. Pt made aware of the information she thanked me for checking for her. She does have an appt her today for INR.

## 2018-02-03 NOTE — Telephone Encounter (Signed)
Telephoned pt due to her missed appt at 245pm. She states she forgot the appt. But has a call out to her PCP-Dr. Alben Spittle because they states they might do INR for her and will let her know. Instructed pt to call and let us know if they will manage or we will have to R/S appt here.

## 2018-02-04 DIAGNOSIS — Z7901 Long term (current) use of anticoagulants: Secondary | ICD-10-CM | POA: Diagnosis not present

## 2018-02-04 DIAGNOSIS — I480 Paroxysmal atrial fibrillation: Secondary | ICD-10-CM | POA: Diagnosis not present

## 2018-02-18 ENCOUNTER — Telehealth: Payer: Self-pay | Admitting: Internal Medicine

## 2018-02-18 DIAGNOSIS — I480 Paroxysmal atrial fibrillation: Secondary | ICD-10-CM | POA: Diagnosis not present

## 2018-02-18 DIAGNOSIS — M1711 Unilateral primary osteoarthritis, right knee: Secondary | ICD-10-CM | POA: Diagnosis not present

## 2018-02-18 DIAGNOSIS — M1712 Unilateral primary osteoarthritis, left knee: Secondary | ICD-10-CM | POA: Diagnosis not present

## 2018-02-18 DIAGNOSIS — Z7901 Long term (current) use of anticoagulants: Secondary | ICD-10-CM | POA: Diagnosis not present

## 2018-02-18 NOTE — Telephone Encounter (Signed)
Spoke with pt. She has lots of questions about her diagnosis of IPF. Advised her that she would need an appointment with MR so that he could answer all of her questions. Pt has been scheduled to see MR on 03/05/18 at 11:45am. Nothing further was needed.

## 2018-02-19 DIAGNOSIS — R490 Dysphonia: Secondary | ICD-10-CM | POA: Insufficient documentation

## 2018-02-19 DIAGNOSIS — H9313 Tinnitus, bilateral: Secondary | ICD-10-CM | POA: Diagnosis not present

## 2018-02-19 DIAGNOSIS — H903 Sensorineural hearing loss, bilateral: Secondary | ICD-10-CM | POA: Insufficient documentation

## 2018-02-19 DIAGNOSIS — K219 Gastro-esophageal reflux disease without esophagitis: Secondary | ICD-10-CM | POA: Diagnosis not present

## 2018-02-26 DIAGNOSIS — H35361 Drusen (degenerative) of macula, right eye: Secondary | ICD-10-CM | POA: Diagnosis not present

## 2018-02-26 DIAGNOSIS — H40013 Open angle with borderline findings, low risk, bilateral: Secondary | ICD-10-CM | POA: Diagnosis not present

## 2018-02-26 DIAGNOSIS — H43813 Vitreous degeneration, bilateral: Secondary | ICD-10-CM | POA: Diagnosis not present

## 2018-02-26 DIAGNOSIS — H35033 Hypertensive retinopathy, bilateral: Secondary | ICD-10-CM | POA: Diagnosis not present

## 2018-03-05 ENCOUNTER — Ambulatory Visit: Payer: Medicare Other | Admitting: Internal Medicine

## 2018-03-06 ENCOUNTER — Encounter: Payer: Self-pay | Admitting: Internal Medicine

## 2018-03-06 ENCOUNTER — Ambulatory Visit (INDEPENDENT_AMBULATORY_CARE_PROVIDER_SITE_OTHER): Payer: Medicare Other | Admitting: Internal Medicine

## 2018-03-06 ENCOUNTER — Ambulatory Visit: Payer: Medicare Other | Admitting: Internal Medicine

## 2018-03-06 VITALS — BP 136/68 | HR 63 | Ht 66.0 in | Wt 195.2 lb

## 2018-03-06 DIAGNOSIS — J45991 Cough variant asthma: Secondary | ICD-10-CM | POA: Diagnosis not present

## 2018-03-06 DIAGNOSIS — Z23 Encounter for immunization: Secondary | ICD-10-CM | POA: Diagnosis not present

## 2018-03-06 DIAGNOSIS — J849 Interstitial pulmonary disease, unspecified: Secondary | ICD-10-CM

## 2018-03-06 DIAGNOSIS — J8489 Other specified interstitial pulmonary diseases: Secondary | ICD-10-CM

## 2018-03-06 NOTE — Progress Notes (Signed)
Patient ID: Stephanie Cortez, female   DOB: Aug 07, 1933, 84 y.o.   MRN: [id number]       HPI  OV 03/24/2015  Chief Complaint  Patient presents with  . Follow-up    Pt here after PFT in 01/2015. Pt using nocturnal O2 and has noticed a difference the next day in her breathing. Pt states she has a prod cough with discolored mucus, PND, sinus pressure.    Ms. Stephanie Cortez is here for follow-up with her daughter [name], they are presenting for review of ILD workup. I'd always followed up with her baseline diagnosis of bronchiectasis but it started becoming more apparent that she actually has ILD. 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 According to the radiologist it has been stable since 2014 at least. Autoimmune workup showed elevated angiotensin-converting enzyme and CK but otherwise normal. Since the diagnosis of elevated CK her primary care physician has stopped her statin. There is no follow-up CK on this. We also notices that she had nocturnal desaturations restarted her on oxygen and sent her to pulmonary rehabilitation. These 2 measures haven't helped her immensely. She still continues a Symbicort for the prior diagnosis of bronchiectasis. She is inclined to continue that. She will have a flu shot today. She wants to know more about interstitial lung disease and had lot of questions about it  OV  08/11/2015: Acute office visit:  Patient presents to the office today with complaint of chest congestion and cough with yellow mucus intermittent wheezing and shortness of breath that has been ongoing for about 2-3 weeks, but per patient this has worsened over the last 2-3 days. Patient denies fever, chills, chest pain or hemoptysis. Patient endorses scant bloody mucus from her nose, that she feels this is probably related to her nocturnal oxygen use. She presented to her primary care physician [name] at [facility] and was treated initially with a Z-Pak. When Z-Pak did  not resolve patient's symptoms she was then treated with clarithromycin 500 mg twice daily starting 07/25/2015 for a 10 day treatment period . She is currently still taking this antibiotic. States since starting the clarithromycin that she has noticed some improvement.    OV 09/26/2015  Chief Complaint  Patient presents with  . Follow-up    Pt here after seeing SG on 08/10/15 for an acute visit. Pt here after 29mwt and PFT. Pt states she is still not feeling well. Pt c/o headache, hoarseness, wheezing, prod cough. Pt denies increase in SOB and f/c/s.    Routine office visit for this lady with ILD/NSIP pattern (previous diagnosis of bronchiectasis and on Symbicort]. Has associated sinus problems. Her daughter [name] is not with her today.   84 year old female presents for follow-up. She is frustrated by her chronic sinus drainage and chronic sinus tension  in the frontal area. This is been going on for many months a few years. She seen Dr. [name] in ENT and apparently is planning a procedure. In the last several days to a few weeks she's having increasing headache particularly in the last 3 days. However there is no increase in sinus drainage volume or change in color of the sinus drainage [baseline color is much reading between white and yellow and green]  However in the last day or 2 she's increasing cough and wheezing despite taking Symbicort.  She is frustrated by her symptoms. She wants a definite answer currently same time she is reluctant to have surgical lung  biopsy. She is also reluctant to have chronic daily prednisone because of side effects. She is also at the same time frustrated about her extreme level of curiosity and need to search the Internet about her disease condition and then this works her into an anxiety      Spirometry today 09/26/2015 shows FVC 1.55 L/54%, FEV1 1.28 L/59% and a ratio of 83. This comes 6 weeks of her short prednisone burst. This a 300 mL decline in FVC since  August 2016 but an improvement by 170 mL since June 2016   6 minute walk test today: Walk 336 m. Resting pulse ox was 98%. Peak exertion pulse ox was 94%. She had back pain but not shortness of breath.  Last CT scan of the chest was June 2016 which showed NSIP pattern  Last CT scan of the sinuses August 2013 that showed mild mucosal edema in the paranasal sinuses    OV 01/04/2016  Chief Complaint  Patient presents with  . Follow-up    Breathing has been doing well, same as usual.  Discuss oxygen, has not worn oxygen x 2 weeks.  patient only uses at night, discuss testing to get off oxygen.  Having surgery on spine in August, wants to discuss. Wheezing.    FU NSIP with air trapping and cough/wheeze - on symbicort (prior dx in 2015)  - Routine fu. Had annual HRCT - stable ILD with air trapping x 2 years. Overall well. Does not want to use nocturnal o2. But having wheeze and cough that is stable but says symbicort is not helping. Also, reports saysShe is having spine surgery to the low spine in the next few months by Dr [name] and she wants know her preoperative pulmonary risk. She also wants to know the basis of her interstitial lung disease which we have discussed in the past but at this point in time her main issues symptoms. Walking desaturation test 185 feet 3 laps on room at in the office she did not desaturate   OV 02/20/2016  Chief Complaint  Patient presents with  . Acute Visit    Pt c/o prod cough with yellow mucus with scant amount of blood, increase in SOB, wheezing. Pt states the spiriva makes her cough.    FU NSIP with air trapping and cough/wheeze - on symbicort (prior dx in 2015).  Last visit in July 2017 because of air trapping we took her off Symbicort and tried Spiriva and Brio she called on 02/17/2016 increasing cough. She was rotated back to Symbicort. She clearly attributes the worsening cough and symptoms to Spiriva. She feels that after going on Symbicort she is  better. However she is having some yellow mucus and feels she is having acute bronchitis. She has multiple drug allergies listed below. Regarding her interstitial lung disease this is been stable for 2 years. Last visit walking desaturation test did not desaturate. She then had overnight oxygen study 02/07/2016 and results are below and she does not need nocturnal oxygen. Therefore she will not need sleep study  OV 04/04/2017  Chief Complaint  Patient presents with  . Follow-up    Pt states that she has been doing well. Breathing is at a stable point at this moment. Occ. cough and occ. SOB. Denies any CP.    Follow-up NSIP with a trapping and cough/wheeze and Symbicort responsive   83-year-ol overall stable. No new complaints. Not seen her in over a year. No interim health issues other than back pain for  which she is avoiding back surgery. She is in need of a flu shot. She feels Symbicort is working well for her and she wants to continue this. Without Symbicort she decompensated. Walking desaturation test 185 feet 3 laps on room air: Resting heart rate 63. Final 197. Resting pulse ox 100%. Final pulse ox 99%.   08/19/2017 acute extended ov/Wert re: acute cough in setting of flare of rhinitis? sinusitis Chief Complaint  Patient presents with  . Acute Visit    wheezing,SOB with activity,productive cough with green, thick sputum, has started mucinex   nasal symptoms flared  X one week prior to OV with facial pressure/ nasal congestion  Then worse cough/ wheeze with mucus turning green, some epistaxis but no green nasal d/c Not normally needing any kind of rescue rx / does not even have one  symptoms Worse hs and off and on during  noct / props up on several pillows helps some Not limited by breathing from desired activities  But very sedentary  rec Omnicef 300 mg twice daily x 10 days Prednisone 10 mg take  4 each am x 2 days,   2 each am x 2 days,  1 each am x 2 days and stop  I emphasized  that nasal steroids (flonase) ave no immediate benefit in terms of improving symptoms.  To help them reached the target tissue, the patient should use Afrin two puffs every 12 hours  Work on perfecting inhaler technique:   - late add: should notify coumadin adviser re 10 day course of abx       08/27/2017  Acute extended  ov/Wert re: refractory cough/ wheeze Chief Complaint  Patient presents with  . Acute Visit    cough and wheezing are unchanged since last visit. She is coughing up some green sputum- still taking omnicef.    Dyspnea:  No really sob Cough: less in volume, still green, some from nose/mostly from chest/ wakes her up each am / worse also when head hit pillow  Sleep: poorly / 45 degrees SABA use:  N/a Has flutter valve not suing   OV 11/20/2017    Chief Complaint  Patient presents with  . Follow-up    Pt supposed to have PFT but was unable to show for it. Pt saw MW twice February 2019 due to a cough. Pt states her cough is now better. Has some mild SOB.    Follow-up NSIP with a trapping and cough/wheeze and Symbicort responsive    Follow-up interstitial lung disease associated with air trapping and Symbicort responsive.  She presents for follow-up.  Since I saw her last in October 2018 she presented 2 times in February 2019 acutely to this office and treated with Omnicef and prednisone and subsequently symptomatic treatment.  Followed up with nurse practitioner in March.  Pulmonary function test recommended for today's visit but because of delays in her previous appointment at the Coumadin clinic she could not get it done.  But at this point in time she feels her cough is resolved back to baseline she feels good.  She feels Symbicort is helping her a lot.  Walking desaturation test today is essentially close to normal other than mild desaturation to 3 points.  There are no new issues.   OV 03/06/2018  Subjective:  Patient ID: Stephanie Cortez, female , DOB: 11-03-1933 ,  age 29 y.o. , MRN: [id number] , ADDRESS: [address] [place] [address]   03/06/2018 -   Chief Complaint  Patient presents with  . Follow-up    Pt states she has been doing okay since last visit and is mainly here today to discuss prognosis and wants more information abotu her diagnosis. Pt states she has not been sleeping well and states she still has the dry cough. Pt states SOB is stable.     HPI JANET HUMPHREYS 84 y.o. -ast seen in May 2019. She was supposed to see her back in October 2019 with monitoring pulmonary function testing and high risk CT for her ILD not otherwise specified and associated possible cough variant asthma. Howevershe started thinking about this appointment and got extremely worried and anxious. She started looking at the Internet and she thought she might have IPF and she would die in the next few years.Therefore she is here to discuss this. Overall she's stable. She'll have a high dose flu shot. There are no new complaints. Back pain bothers her more This no worsening in dyspnea. There is no wheeze or cough     ROS - per HPI   Simple office walk 185 feet x  3 laps goal with forehead probe 11/20/2017   O2 used Room air  Number laps completed 3 laps  Comments about pace Slow pace  Resting Pulse Ox/HR 100% and 58/min  Final Pulse Ox/HR 97% and 88/min  Desaturated </= 88% no  Desaturated <= 3% points yes  Got Tachycardic >/= 90/min no  Symptoms at end of test Mild dyspnea and back pain  Miscellaneous comments Mild antalgic gait   Results for CLORENE, NERIO (MRN [id number]) as of 11/20/2017 11:40  Ref. Range 12/22/2014 10:11 03/02/2015 16:52 09/26/2015 10:04  FVC-Pre Latest Units: L 1.38 1.88 1.55  FVC-%Pred-Pre Latest Units: % 47 65 54   Results for BURNETT, LIEBER (MRN [id number]) as of 11/20/2017 11:40  Ref. Range 12/22/2014 10:11 03/02/2015 16:52  DLCO unc Latest Units: ml/min/mmHg 17.07 16.90  DLCO unc % pred Latest Units: % 63 62    IMPRESSION: 1. Interstitial lung disease characterized by upper lobe predominant patchy peribronchovascular and subpleural reticulation and faint ground-glass attenuation with associated mild traction bronchiectasis. No frank honeycombing. Extensive air trapping. These findings have not appreciably changed and are most consistent with chronic hypersensitivity pneumonitis. These findings are not consistent with usual interstitial pneumonia (UIP). 2. Two vessel coronary atherosclerosis.  Stable mild cardiomegaly. 3. Aortic atherosclerosis.   Electronically Signed   By: Ilona Sorrel M.D.   On: 12/26/2015 15:44      has a past medical history of Anemia, Anxiety, Atrial fibrillation ([place]), Bronchiectasis, CHF (congestive heart failure) ([place]), COPD (chronic obstructive pulmonary disease) (H[facility]), Cricopharyngeal achalasia, Depression, Diverticulosis, Dyslipidemia, Eczema, GERD with stricture, Glaucoma, H. pylori infection, Hypertension, Hyponatremia, Hypothyroid, IBS (irritable bowel syndrome), Lumbar disc disease, Segmental colitis ([place]), and Vertigo.   reports that she has never smoked. She has never used smokeless tobacco.  Past Surgical History:  Procedure Laterality Date  . BREAST SURGERY     br bx  . CARDIAC CATHETERIZATION    . CATARACT EXTRACTION     both  . COLONOSCOPY    . ESOPHAGOSCOPY W/ BOTOX INJECTION  12/11/2011   Procedure: ESOPHAGOSCOPY WITH BOTOX INJECTION;  Surgeon: Rozetta Nunnery, MD;  Location: [facility];  Service: ENT;  Laterality: N/A;  esophogoscopy with dilation, botox injection  . FOOT SURGERY  03/14/2011   gastroc slide-rt  . TOTAL ABDOMINAL HYSTERECTOMY      Allergies  Allergen Reactions  . Doxycycline  Rash on face and neck  . Flagyl [Metronidazole] Nausea And Vomiting  . Moxifloxacin Other (See Comments)     Weakness/fatigue  . Pantoprazole Sodium Rash  . Penicillins Rash    Has patient had a PCN reaction causing  immediate rash, facial/tongue/throat swelling, SOB or lightheadedness with hypotension:unsure Has patient had a PCN reaction causing severe rash involving mucus membranes or skin necrosis:unsure Has patient had a PCN reaction that required hospitalization:No Has patient had a PCN reaction occurring within the last 10 years:Yes Cannot recall exact reaction due to time lapse If all of the above answers are "NO", then may proceed with Cephalosporin use.     Immunization History  Administered Date(s) Administered  . Influenza Split 04/06/2011, 04/03/2012  . Influenza Whole 06/01/2009, 04/03/2010  . Influenza, High Dose Seasonal PF 04/04/2017  . Influenza,inj,Quad PF,6+ Mos 03/10/2013, 03/24/2015  . Influenza-Unspecified 04/01/2014  . Pneumococcal Polysaccharide-23 07/04/2006    Family History  Problem Relation Age of Onset  . Hypertension Mother   . Stroke Mother   . Emphysema Brother   . Other Father        miner's lung  . Cancer Father        Lung  . Colon polyps Sister   . Pancreatic cancer Sister   . Kidney disease Sister   . Atrial fibrillation Unknown        siblings     Current Outpatient Medications:  .  budesonide-formoterol (SYMBICORT) 80-4.5 MCG/ACT inhaler, Inhale 2 puffs into the lungs 2 (two) times daily., Disp: 10.2 g, Rfl: 0 .  Calcium-Magnesium-Zinc (CAL-MAG-ZINC PO), Take 1 tablet by mouth 3 (three) times daily., Disp: , Rfl:  .  Cholecalciferol (VITAMIN D-3) 1000 units CAPS, Take 4,000 Units by mouth daily., Disp: , Rfl:  .  Cyanocobalamin (VITAMIN B-12 PO), Take 1 tablet by mouth daily., Disp: , Rfl:  .  dextromethorphan-guaiFENesin (MUCINEX DM) 30-600 MG 12hr tablet, Take 1-2 tablets by mouth 2 (two) times daily as needed (for allegies/respiratory issues.)., Disp: , Rfl:  .  diltiazem (CARDIZEM CD) 180 MG 24 hr capsule, Take 1 capsule (180 mg total) by mouth every evening., Disp: 90 capsule, Rfl: 3 .  fluticasone (FLONASE) 50 MCG/ACT nasal spray, Place 1  spray into both nostrils 2 (two) times daily as needed (for nasal congestion)., Disp: , Rfl:  .  folic acid (FOLVITE) 1 MG tablet, Take 1 mg by mouth daily., Disp: , Rfl:  .  gabapentin (NEURONTIN) 100 MG capsule, , Disp: , Rfl:  .  levothyroxine (SYNTHROID, LEVOTHROID) 50 MCG tablet, Take 1 tablet (50 mcg total) by mouth daily., Disp: 90 tablet, Rfl: 3 .  nebivolol (BYSTOLIC) 10 MG tablet, Take 1 tablet (10 mg total) by mouth daily., Disp: 90 tablet, Rfl: 3 .  olmesartan (BENICAR) 40 MG tablet, TAKE 1 TABLET ONCE DAILY., Disp: 30 tablet, Rfl: 12 .  omeprazole (PRILOSEC) 20 MG capsule, TAKE 1 CAPSULE(20 MG) BY MOUTH DAILY, Disp: 90 capsule, Rfl: 4 .  pravastatin (PRAVACHOL) 40 MG tablet, Take 40 mg by mouth daily., Disp: , Rfl:  .  warfarin (COUMADIN) 2.5 MG tablet, Take 1/2-1 tablet by mouth daily as directed by [facility]mad[facility] Clinic (Patient taking differently: Take 1.25-2.5 mg by mouth See admin instructions. Take 1 tablet (2.5 mg)  Monday, Wednesday,  Thursday & Saturday; Take 0.5 tablet (1.25 mg) Tuesday, Friday, & Sunday), Disp: 90 tablet, Rfl: 1      Objective:   Vitals:   03/06/18 1154  BP: 136/68  Pulse: 63  SpO2: 97%  Weight: 195 lb 3.2 oz (88.5 kg)  Height: 5\' 6"  (1.676 m)    Estimated body mass index is 31.51 kg/m as calculated from the following:   Height as of this encounter: 5\' 6"  (1.676 m).   Weight as of this encounter: 195 lb 3.2 oz (88.5 kg).    Filed Weights   03/06/18 1154  Weight: 195 lb 3.2 oz (88.5 kg)     Physical Exam  General Appearance:    Alert, cooperative, no distress, appears stated age - yes , sitting on - chair  Head:    Normocephalic, without obvious abnormality, atraumatic  Eyes:    PERRL, conjunctiva/corneas clear,  Ears:    Normal TM's and external ear canals, both ears  Nose:   Nares normal, septum midline, mucosa normal, no drainage    or sinus tenderness. OXYGEN ON  - no . Patient is @ room air   Throat:   Lips, mucosa, and tongue normal;  teeth and gums normal. Cyanosis on lips - no  Neck:   Supple, symmetrical, trachea midline, no adenopathy;    thyroid:  no enlargement/tenderness/nodules; no carotid   bruit or JVD  Back:     Symmetric, no curvature, ROM normal, no CVA tenderness  Lungs:     Distress - no , Wheeze no, Barrell Chest - no, Purse lip breathing - no, Crackles - yes scattered, faint   Chest Wall:    No tenderness or deformity. Scars in chest no   Heart:    Regular rate and rhythm, S1 and S2 normal, no murmur, rub   or gallop  Breast Exam:    NOT DONE  Abdomen:     Soft, non-tender, bowel sounds active all four quadrants,    no masses, no organomegaly  Genitalia:   NOT DONE  Rectal:   NOT DONE  Extremities:   Extremities normal, atraumatic, Clubbing - no, Edema - no  Pulses:   2+ and symmetric all extremities  Skin:   Stigmata of Connective Tissue Disease - no  Lymph nodes:   Cervical, supraclavicular, and axillary nodes normal  Psychiatric:  Neurologic:   plesant CNII-XII intact, normal strength, sensation  throughout           Assessment:       ICD-10-CM   1. ILD (interstitial lung disease) ([place]) J84.9   2. NSIP (nonspecific interstitial pneumonia) ([place]) J84.89   3. Cough variant asthma J45.991   4. Interstitial pulmonary disease (H[facility]) J84.9 CT Chest High Resolution       Plan:       There is no evidence you have IPF variety of ILD Glad you are feeling stable  Plan High dose flu shot 03/06/2018 Restage ILD with   - HRCT oct/nov 2019  - Pre-bd spiro and dlco only. No lung volume or bd response. No post-bd spiro - do oct/nov 2019  -  ONO on room air within 20 days prior to next visit Continue symbicort scheduled Use alb as needed- take script  Followup  - Oct  Nov after above; to ILD clinic            SIGNATURE    Dr. Brand Males, M.D., F.C.C.P,  Pulmonary and Critical Care Medicine Staff Physician, [place] Director - Interstitial Lung Disease   Program  [facility] [place], [place], [address]  Pager: [phone], If no answer or between  15:00h - 7:00h: call  [phone]  [phone] Telephone: [phone]  12:42 PM 03/06/2018

## 2018-03-06 NOTE — Patient Instructions (Signed)
ICD-10-CM   1. ILD (interstitial lung disease) ([facility]) J84.9   2. NSIP (nonspecific interstitial pneumonia) ([place]) J84.89   3. Cough variant asthma J45.991     There is no evidence you have IPF variety of ILD Glad you are feeling stable  Plan High dose flu shot 03/06/2018 Restage ILD with   - HRCT oct/nov 2019  - Pre-bd spiro and dlco only. No lung volume or bd response. No post-bd spiro - do oct/nov 2019  -  ONO on room air within 20 days prior to next visit Continue symbicort scheduled Use alb as needed- take script  Followup  - Oct  Nov after above; to ILD clinic

## 2018-03-11 DIAGNOSIS — Z7901 Long term (current) use of anticoagulants: Secondary | ICD-10-CM | POA: Diagnosis not present

## 2018-03-11 DIAGNOSIS — I480 Paroxysmal atrial fibrillation: Secondary | ICD-10-CM | POA: Diagnosis not present

## 2018-03-27 DIAGNOSIS — M5136 Other intervertebral disc degeneration, lumbar region: Secondary | ICD-10-CM | POA: Diagnosis not present

## 2018-03-27 DIAGNOSIS — M545 Low back pain: Secondary | ICD-10-CM | POA: Diagnosis not present

## 2018-03-27 DIAGNOSIS — M48062 Spinal stenosis, lumbar region with neurogenic claudication: Secondary | ICD-10-CM | POA: Diagnosis not present

## 2018-04-08 ENCOUNTER — Inpatient Hospital Stay: Admission: RE | Admit: 2018-04-08 | Payer: Medicare Other | Source: Ambulatory Visit

## 2018-04-08 DIAGNOSIS — I480 Paroxysmal atrial fibrillation: Secondary | ICD-10-CM | POA: Diagnosis not present

## 2018-04-08 DIAGNOSIS — Z7901 Long term (current) use of anticoagulants: Secondary | ICD-10-CM | POA: Diagnosis not present

## 2018-04-09 ENCOUNTER — Other Ambulatory Visit: Payer: Self-pay | Admitting: Internal Medicine

## 2018-04-14 ENCOUNTER — Inpatient Hospital Stay: Admission: RE | Admit: 2018-04-14 | Payer: Medicare Other | Source: Ambulatory Visit

## 2018-04-15 ENCOUNTER — Ambulatory Visit: Payer: Medicare Other | Admitting: Internal Medicine

## 2018-04-15 DIAGNOSIS — I10 Essential (primary) hypertension: Secondary | ICD-10-CM | POA: Diagnosis not present

## 2018-04-15 DIAGNOSIS — E871 Hypo-osmolality and hyponatremia: Secondary | ICD-10-CM | POA: Diagnosis not present

## 2018-04-15 DIAGNOSIS — E039 Hypothyroidism, unspecified: Secondary | ICD-10-CM | POA: Diagnosis not present

## 2018-04-24 DIAGNOSIS — E871 Hypo-osmolality and hyponatremia: Secondary | ICD-10-CM | POA: Diagnosis not present

## 2018-05-06 ENCOUNTER — Ambulatory Visit: Payer: Medicare Other | Admitting: Internal Medicine

## 2018-05-06 DIAGNOSIS — I480 Paroxysmal atrial fibrillation: Secondary | ICD-10-CM | POA: Diagnosis not present

## 2018-05-06 DIAGNOSIS — Z7901 Long term (current) use of anticoagulants: Secondary | ICD-10-CM | POA: Diagnosis not present

## 2018-05-20 DIAGNOSIS — Z7901 Long term (current) use of anticoagulants: Secondary | ICD-10-CM | POA: Diagnosis not present

## 2018-05-20 DIAGNOSIS — I480 Paroxysmal atrial fibrillation: Secondary | ICD-10-CM | POA: Diagnosis not present

## 2018-06-04 DIAGNOSIS — I480 Paroxysmal atrial fibrillation: Secondary | ICD-10-CM | POA: Diagnosis not present

## 2018-06-04 DIAGNOSIS — Z7901 Long term (current) use of anticoagulants: Secondary | ICD-10-CM | POA: Diagnosis not present

## 2018-06-18 DIAGNOSIS — Z7901 Long term (current) use of anticoagulants: Secondary | ICD-10-CM | POA: Diagnosis not present

## 2018-06-18 DIAGNOSIS — I480 Paroxysmal atrial fibrillation: Secondary | ICD-10-CM | POA: Diagnosis not present

## 2018-07-01 DIAGNOSIS — Z7901 Long term (current) use of anticoagulants: Secondary | ICD-10-CM | POA: Diagnosis not present

## 2018-07-01 DIAGNOSIS — I480 Paroxysmal atrial fibrillation: Secondary | ICD-10-CM | POA: Diagnosis not present

## 2018-07-10 DIAGNOSIS — Z1389 Encounter for screening for other disorder: Secondary | ICD-10-CM | POA: Diagnosis not present

## 2018-07-10 DIAGNOSIS — I480 Paroxysmal atrial fibrillation: Secondary | ICD-10-CM | POA: Diagnosis not present

## 2018-07-10 DIAGNOSIS — Z0000 Encounter for general adult medical examination without abnormal findings: Secondary | ICD-10-CM | POA: Diagnosis not present

## 2018-07-10 DIAGNOSIS — F324 Major depressive disorder, single episode, in partial remission: Secondary | ICD-10-CM | POA: Diagnosis not present

## 2018-07-10 DIAGNOSIS — M545 Low back pain: Secondary | ICD-10-CM | POA: Diagnosis not present

## 2018-07-10 DIAGNOSIS — E039 Hypothyroidism, unspecified: Secondary | ICD-10-CM | POA: Diagnosis not present

## 2018-07-10 DIAGNOSIS — E785 Hyperlipidemia, unspecified: Secondary | ICD-10-CM | POA: Diagnosis not present

## 2018-07-10 DIAGNOSIS — Z1211 Encounter for screening for malignant neoplasm of colon: Secondary | ICD-10-CM | POA: Diagnosis not present

## 2018-07-10 DIAGNOSIS — J849 Interstitial pulmonary disease, unspecified: Secondary | ICD-10-CM | POA: Diagnosis not present

## 2018-07-10 DIAGNOSIS — I10 Essential (primary) hypertension: Secondary | ICD-10-CM | POA: Diagnosis not present

## 2018-07-10 DIAGNOSIS — Z7901 Long term (current) use of anticoagulants: Secondary | ICD-10-CM | POA: Diagnosis not present

## 2018-07-15 ENCOUNTER — Ambulatory Visit (INDEPENDENT_AMBULATORY_CARE_PROVIDER_SITE_OTHER): Payer: Medicare Other

## 2018-07-15 ENCOUNTER — Encounter: Payer: Self-pay | Admitting: Podiatry

## 2018-07-15 ENCOUNTER — Ambulatory Visit (INDEPENDENT_AMBULATORY_CARE_PROVIDER_SITE_OTHER): Payer: Medicare Other | Admitting: Podiatry

## 2018-07-15 VITALS — BP 158/79 | HR 61 | Resp 16

## 2018-07-15 DIAGNOSIS — M779 Enthesopathy, unspecified: Secondary | ICD-10-CM | POA: Diagnosis not present

## 2018-07-15 DIAGNOSIS — G629 Polyneuropathy, unspecified: Secondary | ICD-10-CM

## 2018-07-15 DIAGNOSIS — M778 Other enthesopathies, not elsewhere classified: Secondary | ICD-10-CM

## 2018-07-16 NOTE — Progress Notes (Signed)
Subjective:  Patient ID: Stephanie Cortez, female    DOB: February 04, 1934,  MRN: [id number] HPI Chief Complaint  Patient presents with  . Foot Pain    1st MPJ right - patient had surgery on right foot in [address] in [place] for HAV, HT and neuroma - no ROM and stiffness in big toe joint, hallux doesn't touch the ground when standing, aches all the time, also has burning in both feet, but says she has back issues as well  . New Patient (Initial Visit)    85 y.o. female presents with the above complaint.   ROS: Denies fever chills nausea vomiting muscle aches pains calf pain back pain chest pain shortness of breath and headache.  Past Medical History:  Diagnosis Date  . Anemia    - Hgb 9.7gm% on 07/13/2008 in [place] -  Hgg 129gm% wiht normal irone levsl and ferritin 10/27/2008 in [place] Recurrent otitis/sinusitis  . Anxiety    chronic BZ prn  . Atrial fibrillation (HCC)    chronic anticoag  . Bronchiectasis    >PFT 07/13/2008 in [place] 1.9L/76%, FVC 2.45L/74, Ratio 79, TLC 121%, DLCO 64%  AE BRonchiectasis - Dec 2010.New Rx:  outpatient - Feb 2011 - Rx outpatient  . CHF (congestive heart failure) ([facility])   . COPD (chronic obstructive pulmonary disease) (HCC)    bronchiectasis  . Cricopharyngeal achalasia   . Depression   . Diverticulosis   . Dyslipidemia   . Eczema   . GERD with stricture   . Glaucoma   . H. pylori infection   . Hypertension   . Hyponatremia    chronic, s/p endo eval 06/2012  . Hypothyroid   . IBS (irritable bowel syndrome)   . Lumbar disc disease   . 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 Vertigo    Past Surgical History:  Procedure Laterality Date  . BREAST SURGERY     br bx  . CARDIAC CATHETERIZATION    . CATARACT EXTRACTION     both  . COLONOSCOPY    . ESOPHAGOSCOPY W/ BOTOX INJECTION  12/11/2011   Procedure: ESOPHAGOSCOPY WITH BOTOX INJECTION;  Surgeon: Rozetta Nunnery, MD;  Location: [facility];  Service: ENT;  Laterality: N/A;  esophogoscopy  with dilation, botox injection  . FOOT SURGERY  03/14/2011   gastroc slide-rt  . TOTAL ABDOMINAL HYSTERECTOMY      Current Outpatient Medications:  .  Calcium-Magnesium-Zinc (CAL-MAG-ZINC PO), Take 1 tablet by mouth 3 (three) times daily., Disp: , Rfl:  .  Cholecalciferol (VITAMIN D-3) 1000 units CAPS, Take 4,000 Units by mouth daily., Disp: , Rfl:  .  Cyanocobalamin (VITAMIN B-12 PO), Take 1 tablet by mouth daily., Disp: , Rfl:  .  dextromethorphan-guaiFENesin (MUCINEX DM) 30-600 MG 12hr tablet, Take 1-2 tablets by mouth 2 (two) times daily as needed (for allegies/respiratory issues.)., Disp: , Rfl:  .  diltiazem (CARDIZEM CD) 180 MG 24 hr capsule, Take 1 capsule (180 mg total) by mouth every evening., Disp: 90 capsule, Rfl: 3 .  fluticasone (FLONASE) 50 MCG/ACT nasal spray, Place 1 spray into both nostrils 2 (two) times daily as needed (for nasal congestion)., Disp: , Rfl:  .  folic acid (FOLVITE) 1 MG tablet, Take 1 mg by mouth daily., Disp: , Rfl:  .  gabapentin (NEURONTIN) 100 MG capsule, , Disp: , Rfl:  .  levothyroxine (SYNTHROID, LEVOTHROID) 50 MCG tablet, Take 1 tablet (50 mcg total) by mouth daily., Disp: 90 tablet, Rfl: 3 .  nebivolol (  BYSTOLIC) 10 MG tablet, Take 1 tablet (10 mg total) by mouth daily., Disp: 90 tablet, Rfl: 3 .  olmesartan (BENICAR) 40 MG tablet, TAKE 1 TABLET ONCE DAILY., Disp: 30 tablet, Rfl: 12 .  omeprazole (PRILOSEC) 20 MG capsule, TAKE 1 CAPSULE(20 MG) BY MOUTH DAILY, Disp: 90 capsule, Rfl: 4 .  pravastatin (PRAVACHOL) 40 MG tablet, Take 40 mg by mouth daily., Disp: , Rfl:  .  SYMBICORT 80-4.5 MCG/ACT inhaler, INHALE 2 PUFFS INTO THE LUNGS TWICE DAILY, Disp: 10.2 g, Rfl: 11 .  warfarin (COUMADIN) 2.5 MG tablet, Take 1/2-1 tablet by mouth daily as directed by [facility] (Patient taking differently: Take 1.25-2.5 mg by mouth See admin instructions. Take 1 tablet (2.5 mg)  Monday, Wednesday,  Thursday & Saturday; Take 0.5 tablet (1.25 mg) Tuesday, Friday, &  Sunday), Disp: 90 tablet, Rfl: 1  Allergies  Allergen Reactions  . Doxycycline     Rash on face and neck  . Flagyl [Metronidazole] Nausea And Vomiting  . Moxifloxacin Other (See Comments)     Weakness/fatigue  . Pantoprazole Sodium Rash  . Penicillins Rash    Has patient had a PCN reaction causing immediate rash, facial/tongue/throat swelling, SOB or lightheadedness with hypotension:unsure Has patient had a PCN reaction causing severe rash involving mucus membranes or skin necrosis:unsure Has patient had a PCN reaction that required hospitalization:No Has patient had a PCN reaction occurring within the last 10 years:Yes Cannot recall exact reaction due to time lapse If all of the above answers are "NO", then may proceed with Cephalosporin use.    Review of Systems Objective:   Vitals:   07/15/18 1556  BP: (!) 158/79  Pulse: 61  Resp: 16    General: Well developed, nourished, in no acute distress, alert and oriented x3   Dermatological: Skin is warm, dry and supple bilateral. Nails x 10 are well maintained; remaining integument appears unremarkable at this time. There are no open sores, no preulcerative lesions, no rash or signs of infection present.  Nail dystrophy bilateral.  Vascular: Dorsalis Pedis artery and Posterior Tibial artery pedal pulses are 2/4 bilateral with immedate capillary fill time. Pedal hair growth present. No varicosities and no lower extremity edema present bilateral.   Neruologic: Grossly intact via light touch bilateral. Vibratory intact via tuning fork bilateral. Protective threshold with Semmes Wienstein monofilament diminished to all pedal sites bilateral. Patellar and Achilles deep tendon reflexes 2+ bilateral. No Babinski or clonus noted bilateral.   Musculoskeletal: No gross boney pedal deformities bilateral. No pain, crepitus, or limitation noted with foot and ankle range of motion bilateral. Muscular strength 5/5 in all groups tested bilateral.   Limitation on range of motion of the first metatarsophalangeal joint of the right foot significant osteoarthritis with an elevated first metatarsal.  Previous surgeries have been performed.  She also has hammertoe deformity to the lesser toes most likely consistent with previous surgeries as well.  Contralateral foot does not demonstrate similar findings.  Gait: Unassisted, Nonantalgic.    Radiographs:  No acute findings chronic findings consist of osteoarthritis.  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  She also has hammertoe deformities.  Neuropathy.  Osteoarthritis bilateral foot.  Dystrophic toenails.  Plan: At this point we will increase her gabapentin to 400 mg at night she is currently taking 200.  I will follow-up with her in 1 month at which time we will discuss increasing the gabapentin.  She will follow-up with Dr. Elisha Ponder for nail debridement.     Max T. Branch, [place]

## 2018-07-17 DIAGNOSIS — M5417 Radiculopathy, lumbosacral region: Secondary | ICD-10-CM | POA: Diagnosis not present

## 2018-07-17 DIAGNOSIS — M545 Low back pain: Secondary | ICD-10-CM | POA: Diagnosis not present

## 2018-07-17 DIAGNOSIS — M48062 Spinal stenosis, lumbar region with neurogenic claudication: Secondary | ICD-10-CM | POA: Diagnosis not present

## 2018-07-17 DIAGNOSIS — M5136 Other intervertebral disc degeneration, lumbar region: Secondary | ICD-10-CM | POA: Diagnosis not present

## 2018-07-18 DIAGNOSIS — M4807 Spinal stenosis, lumbosacral region: Secondary | ICD-10-CM | POA: Diagnosis not present

## 2018-07-21 ENCOUNTER — Ambulatory Visit (INDEPENDENT_AMBULATORY_CARE_PROVIDER_SITE_OTHER): Payer: Medicare Other | Admitting: Podiatry

## 2018-07-21 ENCOUNTER — Encounter: Payer: Self-pay | Admitting: Podiatry

## 2018-07-21 DIAGNOSIS — B351 Tinea unguium: Secondary | ICD-10-CM

## 2018-07-21 DIAGNOSIS — M79675 Pain in left toe(s): Secondary | ICD-10-CM | POA: Diagnosis not present

## 2018-07-21 DIAGNOSIS — M79674 Pain in right toe(s): Secondary | ICD-10-CM | POA: Diagnosis not present

## 2018-07-21 NOTE — Patient Instructions (Signed)

## 2018-07-21 NOTE — Progress Notes (Signed)
Subjective: Stephanie Cortez presents today with painful, thick toenails 1-5 b/l that she cannot cut and which interfere with daily activities.  Pain is aggravated when wearing enclosed shoe gear.  She would like to discuss treatment options for her mycotic toenails.   She states she will be having back surgery in the near future.  [name], MD is her PCP.   Current Outpatient Medications:  .  azithromycin (ZITHROMAX) 250 MG tablet, , Disp: , Rfl:  .  Calcium-Magnesium-Zinc (CAL-MAG-ZINC PO), Take 1 tablet by mouth 3 (three) times daily., Disp: , Rfl:  .  Cholecalciferol (VITAMIN D-3) 1000 units CAPS, Take 4,000 Units by mouth daily., Disp: , Rfl:  .  Cyanocobalamin (VITAMIN B-12 PO), Take 1 tablet by mouth daily., Disp: , Rfl:  .  dextromethorphan-guaiFENesin (MUCINEX DM) 30-600 MG 12hr tablet, Take 1-2 tablets by mouth 2 (two) times daily as needed (for allegies/respiratory issues.)., Disp: , Rfl:  .  diltiazem (CARDIZEM CD) 180 MG 24 hr capsule, Take 1 capsule (180 mg total) by mouth every evening., Disp: 90 capsule, Rfl: 3 .  FLUoxetine (PROZAC) 10 MG capsule, , Disp: , Rfl:  .  fluticasone (FLONASE) 50 MCG/ACT nasal spray, Place 1 spray into both nostrils 2 (two) times daily as needed (for nasal congestion)., Disp: , Rfl:  .  folic acid (FOLVITE) 1 MG tablet, Take 1 mg by mouth daily., Disp: , Rfl:  .  gabapentin (NEURONTIN) 100 MG capsule, , Disp: , Rfl:  .  levothyroxine (SYNTHROID, LEVOTHROID) 50 MCG tablet, Take 1 tablet (50 mcg total) by mouth daily., Disp: 90 tablet, Rfl: 3 .  nebivolol (BYSTOLIC) 10 MG tablet, Take 1 tablet (10 mg total) by mouth daily., Disp: 90 tablet, Rfl: 3 .  olmesartan (BENICAR) 40 MG tablet, TAKE 1 TABLET ONCE DAILY., Disp: 30 tablet, Rfl: 12 .  omeprazole (PRILOSEC) 20 MG capsule, TAKE 1 CAPSULE(20 MG) BY MOUTH DAILY, Disp: 90 capsule, Rfl: 4 .  pravastatin (PRAVACHOL) 40 MG tablet, Take 40 mg by mouth daily., Disp: , Rfl:  .  SYMBICORT 80-4.5  MCG/ACT inhaler, INHALE 2 PUFFS INTO THE LUNGS TWICE DAILY, Disp: 10.2 g, Rfl: 11 .  warfarin (COUMADIN) 2.5 MG tablet, Take 1/2-1 tablet by mouth daily as directed by [facility] (Patient taking differently: Take 1.25-2.5 mg by mouth See admin instructions. Take 1 tablet (2.5 mg)  Monday, Wednesday,  Thursday & Saturday; Take 0.5 tablet (1.25 mg) Tuesday, Friday, & Sunday), Disp: 90 tablet, Rfl: 1  Allergies  Allergen Reactions  . Doxycycline     Rash on face and neck  . Flagyl [Metronidazole] Nausea And Vomiting  . Moxifloxacin Other (See Comments)     Weakness/fatigue  . Pantoprazole Sodium Rash  . Penicillins Rash    Has patient had a PCN reaction causing immediate rash, facial/tongue/throat swelling, SOB or lightheadedness with hypotension:unsure Has patient had a PCN reaction causing severe rash involving mucus membranes or skin necrosis:unsure Has patient had a PCN reaction that required hospitalization:No Has patient had a PCN reaction occurring within the last 10 years:Yes Cannot recall exact reaction due to time lapse If all of the above answers are "NO", then may proceed with Cephalosporin use.     Objective:  Vascular Examination: Capillary refill time immediate x 10 digits  Dorsalis pedis and Posterior tibial pulses palpable b/l  Digital hair present x 10 digits  Skin temperature gradient WNL b/l  Dermatological Examination: Skin with normal turgor, texture and tone b/l  Toenails 1-5 b/l discolored,  thick, dystrophic with subungual debris and pain with palpation to nailbeds due to thickness of nails.  Musculoskeletal: Muscle strength 5/5 to all LE muscle groups  Hammertoe deformity lesser toes right foot.   Noted limitation of joint dorsiflexion/plantarflexion right 1st MPJ.  Neurological: Sensation intact with 10 gram monofilament. Vibratory sensation intact.  Assessment: Painful onychomycosis toenails 1-5 b/l   Plan: 1. Toenails 1-5 b/l were  debrided  in length and girth without iatrogenic bleeding. 2. We discussed treatment options for onychomycosis. Will start topical compounded antifungal from [facility]. She is to apply to toenails at bedtime.  3. Patient to continue soft, supportive shoe gear 4. Patient to report any pedal injuries to medical professional immediately. 5. Follow up 3 months. Patient/POA to call should there be a concern in the interim.

## 2018-07-22 DIAGNOSIS — Z1211 Encounter for screening for malignant neoplasm of colon: Secondary | ICD-10-CM | POA: Diagnosis not present

## 2018-07-22 MED ORDER — NONFORMULARY OR COMPOUNDED ITEM: 5 refills | Status: DC

## 2018-07-22 NOTE — Addendum Note (Signed)
Addended by: Harriett Sine D on: 07/22/2018 11:02 AM   Modules accepted: Orders

## 2018-07-23 ENCOUNTER — Telehealth: Payer: Self-pay | Admitting: *Deleted

## 2018-07-23 NOTE — Telephone Encounter (Signed)
Patient with diagnosis of afib on warfarin for anticoagulation.    Procedure: L4-5 LUMBAR MICROLAMINECTOMY  Date of procedure: TBD  CHADS2-VASc score of  6 (CHF, HTN, AGE, DM2, stroke/tia x 2, CAD, AGE, female)  Per office protocol, patient can hold warfarin for 5 days prior to procedure.

## 2018-07-23 NOTE — Telephone Encounter (Signed)
   [facility] Pre-operative Risk Assessment    Request for surgical clearance:  1. What type of surgery is being performed? L4-5 LUMBAR MICROLAMINECTOMY   2. When is this surgery scheduled? TBD   3. What type of clearance is required (medical clearance vs. Pharmacy clearance to hold med vs. Both)? BOTH  4. Are there any medications that need to be held prior to surgery and how long?WARFRIN X 5 DAYS PRIOR    5. Practice name and name of physician performing surgery? [facility]; DR. Bennie Pierini [place]   6. What is your office phone number [phone]    7.   What is your office fax number [phone]  8.   Anesthesia type (None, local, MAC, general) ? GENERAL   Stephanie Cortez 07/23/2018, 9:21 AM  _________________________________________________________________   (provider comments below)

## 2018-07-27 DIAGNOSIS — S42295A Other nondisplaced fracture of upper end of left humerus, initial encounter for closed fracture: Secondary | ICD-10-CM | POA: Diagnosis not present

## 2018-07-28 DIAGNOSIS — M25512 Pain in left shoulder: Secondary | ICD-10-CM | POA: Diagnosis not present

## 2018-07-29 ENCOUNTER — Other Ambulatory Visit: Payer: Self-pay | Admitting: *Deleted

## 2018-07-29 MED ORDER — GABAPENTIN 100 MG PO CAPS: 100.0000 mg | ORAL_CAPSULE | Freq: Four times a day (QID) | ORAL | 3 refills | Status: DC

## 2018-07-31 ENCOUNTER — Other Ambulatory Visit: Payer: Self-pay | Admitting: Internal Medicine

## 2018-08-06 NOTE — Telephone Encounter (Signed)
   Primary Cardiologist:Steven Caryl Comes, MD  Chart reviewed as part of pre-operative protocol coverage. Because of Stephanie Cortez past medical history and time since last visit, he/she will require a follow-up visit in order to better assess preoperative cardiovascular risk.  If pt can wait she has appt with Dr. Caryl Comes 09/04/18   Pre-op covering staff: - Please schedule appointment and call patient to inform them. - Please contact requesting surgeon's office via preferred method (i.e, phone, fax) to inform them of need for appointment prior to surgery.  If applicable, this message will also be routed to pharmacy pool and/or primary cardiologist for input on holding anticoagulant/antiplatelet agent as requested below so that this information is available at time of patient's appointment.   Cecilie Kicks, NP  08/06/2018, 12:11 PM

## 2018-08-06 NOTE — Telephone Encounter (Signed)
Tried to reach pt re: surgical clearance. Everytime I call, it rings then goes to busy signa. Will try again later.

## 2018-08-07 NOTE — Telephone Encounter (Signed)
Tried to contact pt re: surgical clearance.  Phone rings 1 x and then rings busy. No DPR on file to contact.  Will try again later.

## 2018-08-07 NOTE — Telephone Encounter (Signed)
Unable to leave pt a message to get appt scheduled. Called dtr and left her a message, asking her to get Ms Sachdeva to call, to arrange an appt preop.  Rosaria Ferries, PA-C 08/07/2018 4:07 PM [facility] [phone]

## 2018-08-08 NOTE — Telephone Encounter (Signed)
Have been unable to reach patient. Please provide clearance during her upcoming appt with Dr. Caryl Comes.  I will remove from preop pool.

## 2018-08-11 DIAGNOSIS — Z7901 Long term (current) use of anticoagulants: Secondary | ICD-10-CM | POA: Diagnosis not present

## 2018-08-11 DIAGNOSIS — I480 Paroxysmal atrial fibrillation: Secondary | ICD-10-CM | POA: Diagnosis not present

## 2018-08-12 ENCOUNTER — Ambulatory Visit: Payer: Medicare Other | Admitting: Podiatry

## 2018-08-13 DIAGNOSIS — M25512 Pain in left shoulder: Secondary | ICD-10-CM | POA: Diagnosis not present

## 2018-08-14 NOTE — Telephone Encounter (Signed)
I s/w Deanna at [facility] to inform her that we have attempted to reach the pt x 4 in regards to appt. I did advised she has appt with dr. Caryl Comes 09/24/18. Deanna also states she has not been not able to reach the pt as well. She did ask if she does reach the pt does she need to tell the pt to call our office for a sooner appt. I advised that if she is needing her surgery she probably should call and try to get a sooner appt for surgery clearance . Deanna thanked me for the update.

## 2018-08-26 DIAGNOSIS — I480 Paroxysmal atrial fibrillation: Secondary | ICD-10-CM | POA: Diagnosis not present

## 2018-08-26 DIAGNOSIS — Z7901 Long term (current) use of anticoagulants: Secondary | ICD-10-CM | POA: Diagnosis not present

## 2018-08-27 DIAGNOSIS — M25512 Pain in left shoulder: Secondary | ICD-10-CM | POA: Diagnosis not present

## 2018-09-02 DIAGNOSIS — S42295D Other nondisplaced fracture of upper end of left humerus, subsequent encounter for fracture with routine healing: Secondary | ICD-10-CM | POA: Diagnosis not present

## 2018-09-03 DIAGNOSIS — M7741 Metatarsalgia, right foot: Secondary | ICD-10-CM | POA: Diagnosis not present

## 2018-09-03 DIAGNOSIS — M7742 Metatarsalgia, left foot: Secondary | ICD-10-CM | POA: Diagnosis not present

## 2018-09-05 NOTE — Telephone Encounter (Signed)
I am not sure if Pharm-D routed this back to the Pre Op pool. I will route to pre Op pool.

## 2018-09-08 DIAGNOSIS — I480 Paroxysmal atrial fibrillation: Secondary | ICD-10-CM | POA: Diagnosis not present

## 2018-09-08 DIAGNOSIS — Z7901 Long term (current) use of anticoagulants: Secondary | ICD-10-CM | POA: Diagnosis not present

## 2018-09-15 ENCOUNTER — Telehealth: Payer: Self-pay | Admitting: Internal Medicine

## 2018-09-15 NOTE — Telephone Encounter (Signed)
Called and spoke with patient, she stated that she is supposed to be going to her PT appointments but she is worried about going due to the COVID-19 outbreak. Patient stated that she can do some workouts at home.   MR please advise, thank you.

## 2018-09-16 NOTE — Telephone Encounter (Signed)
Stay home. Hunker down. No PT. AGreed

## 2018-09-17 NOTE — Telephone Encounter (Signed)
Left message for patient to call back for MR's recs.

## 2018-09-18 NOTE — Telephone Encounter (Signed)
LMTCB x2 for pt 

## 2018-09-19 MED ORDER — BUDESONIDE-FORMOTEROL FUMARATE 80-4.5 MCG/ACT IN AERO: 2.0000 | INHALATION_SPRAY | Freq: Two times a day (BID) | RESPIRATORY_TRACT | 11 refills | Status: DC

## 2018-09-19 NOTE — Telephone Encounter (Signed)
Called and spoke with pt telling her that MR said for her to stay at home and not go to PT appts. Told her if she can do home PT, that would be very helpful for her to do activities at home. Pt expressed understanding. Also asked pt if she still needed a refill of her symbicort and pt stated she did. Verified pt's preferred pharmacy and sent refill in for pt. Nothing further needed.

## 2018-09-22 DIAGNOSIS — I480 Paroxysmal atrial fibrillation: Secondary | ICD-10-CM | POA: Diagnosis not present

## 2018-09-22 DIAGNOSIS — Z7901 Long term (current) use of anticoagulants: Secondary | ICD-10-CM | POA: Diagnosis not present

## 2018-09-24 ENCOUNTER — Ambulatory Visit: Payer: Medicare Other | Admitting: Internal Medicine

## 2018-10-02 ENCOUNTER — Other Ambulatory Visit: Payer: Self-pay | Admitting: Internal Medicine

## 2018-10-09 ENCOUNTER — Other Ambulatory Visit: Payer: Self-pay | Admitting: Internal Medicine

## 2018-10-17 DIAGNOSIS — R042 Hemoptysis: Secondary | ICD-10-CM | POA: Diagnosis not present

## 2018-10-20 ENCOUNTER — Ambulatory Visit: Payer: Medicare Other | Admitting: Podiatry

## 2018-10-21 DIAGNOSIS — I480 Paroxysmal atrial fibrillation: Secondary | ICD-10-CM | POA: Diagnosis not present

## 2018-10-21 DIAGNOSIS — Z7901 Long term (current) use of anticoagulants: Secondary | ICD-10-CM | POA: Diagnosis not present

## 2018-10-27 DIAGNOSIS — L03115 Cellulitis of right lower limb: Secondary | ICD-10-CM | POA: Diagnosis not present

## 2018-11-10 ENCOUNTER — Other Ambulatory Visit: Payer: Self-pay | Admitting: Family Medicine

## 2018-11-10 DIAGNOSIS — M7989 Other specified soft tissue disorders: Secondary | ICD-10-CM

## 2018-11-11 ENCOUNTER — Ambulatory Visit
Admission: RE | Admit: 2018-11-11 | Discharge: 2018-11-11 | Disposition: A | Discharge status: Home / Self Care | Payer: Medicare Other | Source: Ambulatory Visit | Attending: Family Medicine | Admitting: Family Medicine

## 2018-11-11 DIAGNOSIS — M7989 Other specified soft tissue disorders: Secondary | ICD-10-CM

## 2018-11-11 DIAGNOSIS — R600 Localized edema: Secondary | ICD-10-CM | POA: Diagnosis not present

## 2018-11-18 DIAGNOSIS — I480 Paroxysmal atrial fibrillation: Secondary | ICD-10-CM | POA: Diagnosis not present

## 2018-11-18 DIAGNOSIS — Z7901 Long term (current) use of anticoagulants: Secondary | ICD-10-CM | POA: Diagnosis not present

## 2018-12-11 ENCOUNTER — Telehealth: Payer: Self-pay

## 2018-12-11 NOTE — Telephone Encounter (Signed)
I tried to call patient about appointment on 12/22/18. We need to change office visit to telehealth visit. There was no answer and the call dropped, I could not leave a message.

## 2018-12-16 NOTE — Telephone Encounter (Signed)
I called and spoke with patient, she is ok with switching office visit to telehealth visit on 12/16/18.      Virtual Visit Pre-Appointment Phone Call  "(Name), I am calling you today to discuss your upcoming appointment. We are currently trying to limit exposure to the virus that causes COVID-19 by seeing patients at home rather than in the office."  1. "What is the BEST phone number to call the day of the visit?" - include this in appointment notes  2. "Do you have or have access to (through a family member/friend) a smartphone with video capability that we can use for your visit?" a. If yes - list this number in appt notes as "cell" (if different from BEST phone #) and list the appointment type as a VIDEO visit in appointment notes b. If no - list the appointment type as a PHONE visit in appointment notes  3. Confirm consent - "In the setting of the current Covid19 crisis, you are scheduled for a (phone or video) visit with your provider on (date) at (time).  Just as we do with many in-office visits, in order for you to participate in this visit, we must obtain consent.  If you'd like, I can send this to your mychart (if signed up) or email for you to review.  Otherwise, I can obtain your verbal consent now.  All virtual visits are billed to your insurance company just like a normal visit would be.  By agreeing to a virtual visit, we'd like you to understand that the technology does not allow for your provider to perform an examination, and thus may limit your provider's ability to fully assess your condition. If your provider identifies any concerns that need to be evaluated in person, we will make arrangements to do so.  Finally, though the technology is pretty good, we cannot assure that it will always work on either your or our end, and in the setting of a video visit, we may have to convert it to a phone-only visit.  In either situation, we cannot ensure that we have a secure connection.  Are  you willing to proceed?" STAFF: Did the patient verbally acknowledge consent to telehealth visit? Document YES/NO here: YES  4. Advise patient to be prepared - "Two hours prior to your appointment, go ahead and check your blood pressure, pulse, oxygen saturation, and your weight (if you have the equipment to check those) and write them all down. When your visit starts, your provider will ask you for this information. If you have an Apple Watch or Kardia device, please plan to have heart rate information ready on the day of your appointment. Please have a pen and paper handy nearby the day of the visit as well."  5. Give patient instructions for MyChart download to smartphone OR Doximity/Doxy.me as below if video visit (depending on what platform provider is using)  6. Inform patient they will receive a phone call 15 minutes prior to their appointment time (may be from unknown caller ID) so they should be prepared to answer    TELEPHONE CALL NOTE  SAMIRAH SCARPATI has been deemed a candidate for a follow-up tele-health visit to limit community exposure during the Covid-19 pandemic. I spoke with the patient via phone to ensure availability of phone/video source, confirm preferred email & phone number, and discuss instructions and expectations.  I reminded CHANIE SOUCEK to be prepared with any vital sign and/or heart rhythm information that could potentially be obtained  via home monitoring, at the time of her visit. I reminded BRIANNIA LABA to expect a phone call prior to her visit.  Mady Haagensen, [place] 12/16/2018 8:23 AM   INSTRUCTIONS FOR DOWNLOADING THE MYCHART APP TO SMARTPHONE  - The patient must first make sure to have activated MyChart and know their login information - If Apple, go to [employer] and type in MyChart in the search bar and download the app. If Android, ask patient to go to [employer] and type in [place] in the search bar and download the app. The app is free but as  with any other app downloads, their phone may require them to verify saved payment information or Apple/Android password.  - The patient will need to then log into the app with their MyChart username and password, and select [facility] as their healthcare provider to link the account. When it is time for your visit, go to the MyChart app, find appointments, and click Begin Video Visit. Be sure to Select Allow for your device to access the Microphone and Camera for your visit. You will then be connected, and your provider will be with you shortly.  **If they have any issues connecting, or need assistance please contact MyChart service desk [phone])**  **If using a computer, in order to ensure the best quality for their visit they will need to use either of the following Internet Browsers: [employer], or Google Chrome**  IF USING DOXIMITY or DOXY.ME - The patient will receive a link just prior to their visit by text.     FULL LENGTH CONSENT FOR TELE-HEALTH VISIT   I hereby voluntarily request, consent and authorize [facility] and its employed or contracted physicians, physician assistants, nurse practitioners or other licensed health care professionals (the Practitioner), to provide me with telemedicine health care services (the "Services") as deemed necessary by the treating Practitioner. I acknowledge and consent to receive the Services by the Practitioner via telemedicine. I understand that the telemedicine visit will involve communicating with the Practitioner through live audiovisual communication technology and the disclosure of certain medical information by electronic transmission. I acknowledge that I have been given the opportunity to request an in-person assessment or other available alternative prior to the telemedicine visit and am voluntarily participating in the telemedicine visit.  I understand that I have the right to withhold or withdraw my consent to the  use of telemedicine in the course of my care at any time, without affecting my right to future care or treatment, and that the Practitioner or I may terminate the telemedicine visit at any time. I understand that I have the right to inspect all information obtained and/or recorded in the course of the telemedicine visit and may receive copies of available information for a reasonable fee.  I understand that some of the potential risks of receiving the Services via telemedicine include:  [name] Kitchen Delay or interruption in medical evaluation due to technological equipment failure or disruption; . Information transmitted may not be sufficient (e.g. poor resolution of images) to allow for appropriate medical decision making by the Practitioner; and/or  . In rare instances, security protocols could fail, causing a breach of personal health information.  Furthermore, I acknowledge that it is my responsibility to provide information about my medical history, conditions and care that is complete and accurate to the best of my ability. I acknowledge that Practitioner's advice, recommendations, and/or decision may be based on factors not within their control, such  as incomplete or inaccurate data provided by me or distortions of diagnostic images or specimens that may result from electronic transmissions. I understand that the practice of medicine is not an exact science and that Practitioner makes no warranties or guarantees regarding treatment outcomes. I acknowledge that I will receive a copy of this consent concurrently upon execution via email to the email address I last provided but may also request a printed copy by calling the office of [facility].    I understand that my insurance will be billed for this visit.   I have read or had this consent read to me. . I understand the contents of this consent, which adequately explains the benefits and risks of the Services being provided via telemedicine.  . I have  been provided ample opportunity to ask questions regarding this consent and the Services and have had my questions answered to my satisfaction. . I give my informed consent for the services to be provided through the use of telemedicine in my medical care  By participating in this telemedicine visit I agree to the above.

## 2018-12-18 DIAGNOSIS — S00411A Abrasion of right ear, initial encounter: Secondary | ICD-10-CM | POA: Diagnosis not present

## 2018-12-18 DIAGNOSIS — Z7901 Long term (current) use of anticoagulants: Secondary | ICD-10-CM | POA: Diagnosis not present

## 2018-12-18 DIAGNOSIS — H6121 Impacted cerumen, right ear: Secondary | ICD-10-CM | POA: Diagnosis not present

## 2018-12-18 DIAGNOSIS — R609 Edema, unspecified: Secondary | ICD-10-CM | POA: Diagnosis not present

## 2018-12-19 DIAGNOSIS — H6123 Impacted cerumen, bilateral: Secondary | ICD-10-CM | POA: Diagnosis not present

## 2018-12-22 ENCOUNTER — Encounter: Payer: Self-pay | Admitting: Internal Medicine

## 2018-12-22 ENCOUNTER — Telehealth (INDEPENDENT_AMBULATORY_CARE_PROVIDER_SITE_OTHER): Payer: Medicare Other | Admitting: Internal Medicine

## 2018-12-22 ENCOUNTER — Other Ambulatory Visit: Payer: Self-pay

## 2018-12-22 VITALS — BP 134/62 | HR 60 | Ht 66.0 in | Wt 182.0 lb

## 2018-12-22 DIAGNOSIS — I4821 Permanent atrial fibrillation: Secondary | ICD-10-CM

## 2018-12-22 DIAGNOSIS — I110 Hypertensive heart disease with heart failure: Secondary | ICD-10-CM | POA: Diagnosis not present

## 2018-12-22 DIAGNOSIS — I5032 Chronic diastolic (congestive) heart failure: Secondary | ICD-10-CM | POA: Diagnosis not present

## 2018-12-22 DIAGNOSIS — I10 Essential (primary) hypertension: Secondary | ICD-10-CM

## 2018-12-22 NOTE — Progress Notes (Signed)
Electrophysiology TeleHealth Note   Due to national recommendations of social distancing due to COVID 19, an audio/video telehealth visit is felt to be most appropriate for this patient at this time.  See MyChart message from today for the patient's consent to telehealth for [facility].   Date:  12/22/2018   ID:  Stephanie Cortez, DOB Jul 30, 1933, MRN [id number]  Location: patient's home  Provider location: [address] [place]  Evaluation Performed: Follow-up visit  PCP:  [name], MD  Cardiologist:     Electrophysiologist:  SK   Chief Complaint: Afib  History of Present Illness:    Stephanie Cortez is a 85 y.o. female who presents via audio/video conferencing for a telehealth visit today.The patient did not have access to video technology/had technical difficulties with video requiring transitioning to audio format only (telephone).  All issues noted in this document were discussed and addressed.  No physical exam could be performed with this format.      Since last being seen in our clinic for atrial fibrillation-permanent on chronic coumadin   the patient reports having falling with a shoulder fracture and interval cellulitis--bought a cocker spaniel>> Bella<<  No bleeding on coumadin  No chest pain Breathing is much better--able to get the mail without dypsnea No edema   Diagnosed with ILD-- followed by Dr Chase Caller  Difficult to manage blood pressures   Her sister died last fall.  Ambivalent resting in the [facility]  Date Cr K Hgb  1/19 0.76 4.8 12.7  1/20   12.3    The patient denies symptoms of fevers, chills, cough, or new SOB worrisome for COVID 19.    Past Medical History:  Diagnosis Date  . Anemia    - Hgb 9.7gm% on 07/13/2008 in [place] -  Hgg 129gm% wiht normal [facility] levsl and ferritin 10/27/2008 in [place] Recurrent otitis/sinusitis  . Anxiety    chronic BZ prn  . Atrial fibrillation (HCC)    chronic anticoag  .  Bronchiectasis    >PFT 07/13/2008 in [place] 1.9L/76%, FVC 2.45L/74, Ratio 79, TLC 121%, DLCO 64%  AE BRonchiectasis - Dec 2010.New Rx:  outpatient - Feb 2011 - Rx outpatient  . CHF (congestive heart failure) ([facility])   . COPD (chronic obstructive pulmonary disease) (HCC)    bronchiectasis  . Cricopharyngeal achalasia   . Depression   . Diverticulosis   . Dyslipidemia   . Eczema   . GERD with stricture   . Glaucoma   . H. pylori infection   . Hypertension   . Hyponatremia    chronic, s/p endo eval 06/2012  . Hypothyroid   . IBS (irritable bowel syndrome)   . Lumbar disc disease   . Segmental colitis ([facility])   . Vertigo     Past Surgical History:  Procedure Laterality Date  . BREAST SURGERY     br bx  . CARDIAC CATHETERIZATION    . CATARACT EXTRACTION     both  . COLONOSCOPY    . ESOPHAGOSCOPY W/ BOTOX INJECTION  12/11/2011   Procedure: ESOPHAGOSCOPY WITH BOTOX INJECTION;  Surgeon: Rozetta Nunnery, MD;  Location: [facility];  Service: ENT;  Laterality: N/A;  esophogoscopy with dilation, botox injection  . FOOT SURGERY  03/14/2011   gastroc slide-rt  . TOTAL ABDOMINAL HYSTERECTOMY      Current Outpatient Medications  Medication Sig Dispense Refill  . budesonide-formoterol (SYMBICORT) 80-4.5 MCG/ACT inhaler Inhale 2 puffs  into the lungs 2 (two) times daily. 10.2 g 11  . Calcium-Magnesium-Zinc (CAL-MAG-ZINC PO) Take 1 tablet by mouth 3 (three) times daily.    . Cholecalciferol (VITAMIN D-3) 1000 units CAPS Take 4,000 Units by mouth daily.    . Cyanocobalamin (VITAMIN B-12 PO) Take 1 tablet by mouth daily.    [name] Kitchen dextromethorphan-guaiFENesin (MUCINEX DM) 30-600 MG 12hr tablet Take 1-2 tablets by mouth 2 (two) times daily as needed (for allegies/respiratory issues.).    [name] Kitchen diltiazem (CARDIZEM CD) 180 MG 24 hr capsule Take 180 mg by mouth daily.    [name] Kitchen FLUoxetine (PROZAC) 10 MG tablet Take 10 mg by mouth daily.    . fluticasone (FLONASE) 50 MCG/ACT nasal spray  Place 1 spray into both nostrils 2 (two) times daily as needed (for nasal congestion).    . folic acid (FOLVITE) 1 MG tablet Take 1 mg by mouth daily.    [name] Kitchen levothyroxine (SYNTHROID, LEVOTHROID) 50 MCG tablet Take 1 tablet (50 mcg total) by mouth daily. 90 tablet 3  . nebivolol (BYSTOLIC) 10 MG tablet Take 10 mg by mouth daily.    [name] Kitchen olmesartan (BENICAR) 40 MG tablet Take 40 mg by mouth daily.    [name] Kitchen omeprazole (PRILOSEC) 20 MG capsule TAKE 1 CAPSULE(20 MG) BY MOUTH DAILY 90 capsule 4  . pravastatin (PRAVACHOL) 40 MG tablet Take 40 mg by mouth daily.    [name] Kitchen warfarin (COUMADIN) 2.5 MG tablet Take 1/2-1 tablet by mouth daily as directed by [facility] (Patient taking differently: Take 1.25-2.5 mg by mouth See admin instructions. Take 1 tablet (2.5 mg)  Monday, Wednesday,  Thursday & Saturday; Take 0.5 tablet (1.25 mg) Tuesday, Friday, & Sunday) 90 tablet 1   No current facility-administered medications for this visit.     Allergies:   Doxycycline, Flagyl [metronidazole], Moxifloxacin, Pantoprazole sodium, and Penicillins   Social History:  The patient  reports that she has never smoked. She has never used smokeless tobacco. She reports that she does not drink alcohol or use drugs.   Family History:  The patient's   family history includes Atrial fibrillation in her unknown relative; Cancer in her father; Colon polyps in her sister; Emphysema in her brother; Hypertension in her mother; Kidney disease in her sister; Other in her father; Pancreatic cancer in her sister; Stroke in her mother.   ROS:  Please see the history of present illness.   All other systems are personally reviewed and negative.    Exam:    Vital Signs:  BP 134/62   Pulse 60   Ht 5\' 6"  (1.676 m)   Wt 182 lb (82.6 kg)   BMI 29.38 kg/m     Labs/Other Tests and Data Reviewed:    Recent Labs: No results found for requested labs within last 8760 hours.   Wt Readings from Last 3 Encounters:  12/22/18 182 lb (82.6 kg)   03/06/18 195 lb 3.2 oz (88.5 kg)  11/20/17 191 lb 12.8 oz (87 kg)     Other studies personally reviewed: Additional studies/ records that were reviewed today include:As above *    ASSESSMENT & PLAN:      Atrial fibrillation-permanent  HFpEF  Hypertension  BP reasonably well  Hgb stable   By report euvolemic   Continue current mes  Dyspnea much improved by pulmonary input     COVID 19 screen The patient denies symptoms of COVID 19 at this time.  The importance of social distancing was discussed today.  Follow-up:  2m  Current medicines are reviewed at length with the patient today.   The patient has no concerns regarding her medicines.  The following changes were made today:     Labs/ tests ordered today include:   No orders of the defined types were placed in this encounter.   Future tests ( post COVID )   hs  Patient Risk:  after full review of this patients clinical status, I feel that they are at moderate risk at this time.  Today, I have spent 18 minutes with the patient with telehealth technology discussing the above.  Signed, Virl Axe, MD  12/22/2018 10:42 AM     [place] [address] [phone] (office) [phone] (fax)

## 2019-01-06 DIAGNOSIS — F324 Major depressive disorder, single episode, in partial remission: Secondary | ICD-10-CM | POA: Diagnosis not present

## 2019-01-06 DIAGNOSIS — Z7901 Long term (current) use of anticoagulants: Secondary | ICD-10-CM | POA: Diagnosis not present

## 2019-01-06 DIAGNOSIS — E039 Hypothyroidism, unspecified: Secondary | ICD-10-CM | POA: Diagnosis not present

## 2019-01-06 DIAGNOSIS — I480 Paroxysmal atrial fibrillation: Secondary | ICD-10-CM | POA: Diagnosis not present

## 2019-01-06 DIAGNOSIS — I10 Essential (primary) hypertension: Secondary | ICD-10-CM | POA: Diagnosis not present

## 2019-01-06 DIAGNOSIS — E785 Hyperlipidemia, unspecified: Secondary | ICD-10-CM | POA: Diagnosis not present

## 2019-01-06 DIAGNOSIS — R609 Edema, unspecified: Secondary | ICD-10-CM | POA: Diagnosis not present

## 2019-01-14 ENCOUNTER — Telehealth: Payer: Self-pay | Admitting: Internal Medicine

## 2019-01-14 NOTE — Telephone Encounter (Signed)
Called & spoke w/ pt. Pt states she been a bit more SOB than usual and mildly lightheaded. She notes that she's "not running out of breath" and that she "can still get a deep breath" when she's using her Symbicort 80 2 puffs twice daily. Pt is not on home O2. She denies fever/chills/muscle . Reports mild wheezing at night when she first goes to bed. Also states she has a nonproductive cough w/ occasional clear mucus and mild chest tightness. Pt reports having high blood pressure and other comorbidities (followed by PCP) that she states may be contributing factors, however she would like to be assessed by MR. I let her know MR is not in the office currently and does not have an established future schedule. Pt verbalized understanding.   I offered pt an appointment with NP tomorrow 01/15/2019, which pt agreed to. We set up an appt with TN at 2:00 PM. Pt is negative for COVID screen. She is aware of [facility]'s universal mask and visitor policy and our office's precautions.   Routing to TN as an [place]. Nothing further needed at this time.

## 2019-01-15 ENCOUNTER — Other Ambulatory Visit: Payer: Self-pay

## 2019-01-15 ENCOUNTER — Ambulatory Visit: Payer: Medicare Other | Admitting: Nurse Practitioner

## 2019-01-15 DIAGNOSIS — M7989 Other specified soft tissue disorders: Secondary | ICD-10-CM

## 2019-01-15 NOTE — Progress Notes (Signed)
reflux

## 2019-01-16 DIAGNOSIS — I480 Paroxysmal atrial fibrillation: Secondary | ICD-10-CM | POA: Diagnosis not present

## 2019-01-16 DIAGNOSIS — Z7901 Long term (current) use of anticoagulants: Secondary | ICD-10-CM | POA: Diagnosis not present

## 2019-01-20 ENCOUNTER — Telehealth (HOSPITAL_COMMUNITY): Payer: Self-pay

## 2019-01-20 NOTE — Telephone Encounter (Signed)
The above patient or their representative was contacted and gave the following answers to these questions:         Do you have any of the following symptoms? No  Fever                    Cough                   Shortness of breath  Do  you have any of the following other symptoms?    muscle pain         vomiting,        diarrhea        rash         weakness        red eye        abdominal pain         bruising          bruising or bleeding              joint pain           severe headache    Have you been in contact with someone who was or has been sick in the past 2 weeks? No  Yes                 Unsure                         Unable to assess   Does the person that you were in contact with have any of the following symptoms?   Cough         shortness of breath           muscle pain         vomiting,            diarrhea            rash            weakness           fever            red eye           abdominal pain           bruising  or  bleeding                joint pain                severe headache               Have you  or someone you have been in contact with traveled internationally in th last month?  No        If yes, which countries?   Have you  or someone you have been in contact with traveled outside [place] in th last month? No         If yes, which state and city?   COMMENTS OR ACTION PLAN FOR THIS PATIENT:          

## 2019-01-21 ENCOUNTER — Encounter: Payer: Self-pay | Admitting: Vascular Surgery

## 2019-01-21 ENCOUNTER — Ambulatory Visit (INDEPENDENT_AMBULATORY_CARE_PROVIDER_SITE_OTHER): Payer: Medicare Other | Admitting: Vascular Surgery

## 2019-01-21 ENCOUNTER — Ambulatory Visit (HOSPITAL_COMMUNITY)
Admission: RE | Admit: 2019-01-21 | Discharge: 2019-01-21 | Disposition: A | Discharge status: Home / Self Care | Payer: Medicare Other | Source: Ambulatory Visit | Attending: Vascular Surgery | Admitting: Vascular Surgery

## 2019-01-21 ENCOUNTER — Other Ambulatory Visit: Payer: Self-pay

## 2019-01-21 VITALS — BP 157/88 | HR 77 | Temp 97.9°F | Resp 20 | Ht 66.0 in | Wt 181.0 lb

## 2019-01-21 DIAGNOSIS — M7989 Other specified soft tissue disorders: Secondary | ICD-10-CM | POA: Insufficient documentation

## 2019-01-21 DIAGNOSIS — I872 Venous insufficiency (chronic) (peripheral): Secondary | ICD-10-CM | POA: Diagnosis not present

## 2019-01-21 NOTE — Progress Notes (Signed)
REASON FOR CONSULT:    Persistent swelling of the right leg with erythema.  The consult is requested by Dr. [name].  ASSESSMENT & PLAN:   CHRONIC VENOUS INSUFFICIENCY: This patient does have some reflux in the small saphenous vein on the right which may be contributing to her swelling.  I have discussed with her the importance of intermittent leg elevation and the proper positioning for this.  I also written her a prescription for some compression stockings with a very mild gradient so that these will be easier for her to get on.  I recommended an 8-15 gradient.  I encouraged her to avoid prolonged sitting and standing.  We discussed the importance of exercise specifically walking and water aerobics which I think is very helpful for patients with venous disease.  I reassured her that she has excellent arterial flow and no evidence of DVT.  I will be happy to see her back at any time if her symptoms progress or worsen.  Deitra Mayo, MD, FACS Beeper [phone] Office: [phone]   HPI:   Stephanie Cortez is a pleasant 85 y.o. female, referred for evaluation of swelling in the right leg with cellulitis.  I reviewed the records from the referring office.  The patient was seen on 12/18/2018.  The patient is on long-term anticoagulation for paroxysmal atrial fibrillation.  The patient had presented with bleeding from the right ear secondary to an abrasion.  Patient was noted to have significant peripheral edema.  She is sent for vascular consultation.  The patient tells me that she developed some swelling and redness in her right leg about 2 months ago.  Of note I think she had a new dog and perhaps the dog scratched her.  She was treated with antibiotics and the redness improved significantly although she states she still has some residual erythema.  She denies fever or chills.  She denies any history of DVT or phlebitis.  She denies any history of claudication or rest pain.  She does  have a history of neuropathy.  Past Medical History:  Diagnosis Date  . Anemia    - Hgb 9.7gm% on 07/13/2008 in [place] -  Hgg 129gm% wiht normal irone levsl and ferritin 10/27/2008 in [place] Recurrent otitis/sinusitis  . Anxiety    chronic BZ prn  . Atrial fibrillation (HCC)    chronic anticoag  . Bronchiectasis    >PFT 07/13/2008 in [place] 1.9L/76%, FVC 2.45L/74, Ratio 79, TLC 121%, DLCO 64%  AE BRonchiectasis - Dec 2010.New Rx:  outpatient - Feb 2011 - Rx outpatient  . CHF (congestive heart failure) ([place])   . COPD (chronic obstructive pulmonary disease) (HCC)    bronchiectasis  . Cricopharyngeal achalasia   . Depression   . Diverticulosis   . Dyslipidemia   . Eczema   . GERD with stricture   . Glaucoma   . H. pylori infection   . Hypertension   . Hyponatremia    chronic, s/p endo eval 06/2012  . Hypothyroid   . IBS (irritable bowel syndrome)   . Lumbar disc disease   . 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 Vertigo     Family History  Problem Relation Age of Onset  . Hypertension Mother   . Stroke Mother   . Emphysema Brother   . Other Father        miner's lung  . Cancer Father        Lung  . Colon polyps Sister   .  Pancreatic cancer Sister   . Kidney disease Sister   . Atrial fibrillation Other        siblings    SOCIAL HISTORY: Social History   Socioeconomic History  . Marital status: Married    Spouse name: Not on file  . Number of children: 2  . Years of education: Not on file  . Highest education level: Not on file  Occupational History  . Occupation: retired Product manager: [employer]  Social Needs  . Financial resource strain: Not on file  . Food insecurity    Worry: Not on file    Inability: Not on file  . Transportation needs    Medical: Not on file    Non-medical: Not on file  Tobacco Use  . Smoking status: Never Smoker  . Smokeless tobacco: Never Used  Substance and Sexual Activity  . Alcohol use: No  . Drug use: No  . Sexual activity:  Not on file  Lifestyle  . Physical activity    Days per week: Not on file    Minutes per session: Not on file  . Stress: Not on file  Relationships  . Social Herbalist on phone: Not on file    Gets together: Not on file    Attends religious service: Not on file    Active member of club or organization: Not on file    Attends meetings of clubs or organizations: Not on file    Relationship status: Not on file  . Intimate partner violence    Fear of current or ex partner: Not on file    Emotionally abused: Not on file    Physically abused: Not on file    Forced sexual activity: Not on file  Other Topics Concern  . Not on file  Social History Narrative   2 children 4 grandchildren   Recently moved to [place] from [place]   Retired Pharmacist, hospital   Grew up in [place]. Moved to [place]. Moved to [place] Feb 2010 due to duaghther living in [place]. Son lives in [place], [place]. Husband works as Scientist, physiological in [place].    Allergies  Allergen Reactions  . Doxycycline     Rash on face and neck  . Flagyl [Metronidazole] Nausea And Vomiting  . Moxifloxacin Other (See Comments)     Weakness/fatigue  . Pantoprazole Sodium Rash  . Penicillins Rash    Has patient had a PCN reaction causing immediate rash, facial/tongue/throat swelling, SOB or lightheadedness with hypotension:unsure Has patient had a PCN reaction causing severe rash involving mucus membranes or skin necrosis:unsure Has patient had a PCN reaction that required hospitalization:No Has patient had a PCN reaction occurring within the last 10 years:Yes Cannot recall exact reaction due to time lapse If all of the above answers are "NO", then may proceed with Cephalosporin use.     Current Outpatient Medications  Medication Sig Dispense Refill  . budesonide-formoterol (SYMBICORT) 80-4.5 MCG/ACT inhaler Inhale 2 puffs into the lungs 2 (two) times daily. 10.2 g 11  . Calcium-Magnesium-Zinc  (CAL-MAG-ZINC PO) Take 1 tablet by mouth 3 (three) times daily.    . Cholecalciferol (VITAMIN D-3) 1000 units CAPS Take 4,000 Units by mouth daily.    . Cyanocobalamin (VITAMIN B-12 PO) Take 1 tablet by mouth daily.    [name] Kitchen dextromethorphan-guaiFENesin (MUCINEX DM) 30-600 MG 12hr tablet Take 1-2 tablets by mouth 2 (two) times daily as needed (for allegies/respiratory issues.).    [name] Kitchen diltiazem (  CARDIZEM CD) 180 MG 24 hr capsule Take 180 mg by mouth daily.    [name] Kitchen FLUoxetine (PROZAC) 10 MG tablet Take 10 mg by mouth daily.    . fluticasone (FLONASE) 50 MCG/ACT nasal spray Place 1 spray into both nostrils 2 (two) times daily as needed (for nasal congestion).    . folic acid (FOLVITE) 1 MG tablet Take 1 mg by mouth daily.    . furosemide (LASIX) 20 MG tablet Take 10 mg by mouth.    . levothyroxine (SYNTHROID, LEVOTHROID) 50 MCG tablet Take 1 tablet (50 mcg total) by mouth daily. 90 tablet 3  . nebivolol (BYSTOLIC) 10 MG tablet Take 10 mg by mouth daily.    [name] Kitchen olmesartan (BENICAR) 40 MG tablet Take 40 mg by mouth daily.    [name] Kitchen omeprazole (PRILOSEC) 20 MG capsule TAKE 1 CAPSULE(20 MG) BY MOUTH DAILY 90 capsule 4  . pravastatin (PRAVACHOL) 40 MG tablet Take 40 mg by mouth daily.    [name] Kitchen warfarin (COUMADIN) 2.5 MG tablet Take 1/2-1 tablet by mouth daily as directed by [facility] (Patient taking differently: Take 1.25-2.5 mg by mouth See admin instructions. Take 1 tablet (2.5 mg)  Monday, Wednesday,  Thursday & Saturday; Take 0.5 tablet (1.25 mg) Tuesday, Friday, & Sunday) 90 tablet 1   No current facility-administered medications for this visit.     REVIEW OF SYSTEMS:  [X]  denotes positive finding, [ ]  denotes negative finding Cardiac  Comments:  Chest pain or chest pressure:    Shortness of breath upon exertion:    Short of breath when lying flat:    Irregular heart rhythm:        Vascular    Pain in calf, thigh, or hip brought on by ambulation:    Pain in feet at night that wakes you up from your  sleep:     Blood clot in your veins:    Leg swelling:  x       Pulmonary    Oxygen at home:    Productive cough:     Wheezing:         Neurologic    Sudden weakness in arms or legs:     Sudden numbness in arms or legs:     Sudden onset of difficulty speaking or slurred speech:    Temporary loss of vision in one eye:     Problems with dizziness:         Gastrointestinal    Blood in stool:     Vomited blood:         Genitourinary    Burning when urinating:     Blood in urine:        Psychiatric    Major depression:         Hematologic    Bleeding problems:    Problems with blood clotting too easily:        Skin    Rashes or ulcers:        Constitutional    Fever or chills:     PHYSICAL EXAM:   Vitals:   01/21/19 1358  BP: (!) 157/88  Pulse: 77  Resp: 20  Temp: 97.9 F (36.6 C)  SpO2: 97%  Weight: 181 lb (82.1 kg)  Height: 5\' 6"  (2.725 m)    GENERAL: The patient is a well-nourished female, in no acute distress. The vital signs are documented above. CARDIAC: There is a regular rate and rhythm.  VASCULAR: I do not detect carotid bruits. She has  palpable dorsalis pedis pulses bilaterally. She has mild right lower extremity swelling with minimal cellulitis. She does not have any significant hyperpigmentation. PULMONARY: There is good air exchange bilaterally without wheezing or rales. ABDOMEN: Soft and non-tender with normal pitched bowel sounds.  MUSCULOSKELETAL: There are no major deformities or cyanosis. NEUROLOGIC: No focal weakness or paresthesias are detected. SKIN: There are no ulcers or rashes noted. PSYCHIATRIC: The patient has a normal affect.  DATA:    VENOUS DUPLEX: I have independently interpreted the venous duplex scan of the right lower extremity.  There is no evidence of deep venous thrombosis.  There is no evidence of superficial venous thrombosis.  There is no significant deep venous reflux.  There is superficial venous reflux in the  proximal and mid short saphenous vein.  The vein is not especially dilated.

## 2019-02-02 DIAGNOSIS — Z7901 Long term (current) use of anticoagulants: Secondary | ICD-10-CM | POA: Diagnosis not present

## 2019-02-02 DIAGNOSIS — I480 Paroxysmal atrial fibrillation: Secondary | ICD-10-CM | POA: Diagnosis not present

## 2019-02-11 ENCOUNTER — Telehealth: Payer: Self-pay | Admitting: Internal Medicine

## 2019-02-11 NOTE — Telephone Encounter (Signed)
New message:    Patient calling she is having discomfrot under her left breast it's and she is in pain. Patient would like to see some and would like for some one to call her back.

## 2019-02-11 NOTE — Telephone Encounter (Signed)
Pt calling today with c/o ongoing L CP under her L breast for the past 3 weeks. She states nothing seems to relieve it including rest and tylenol. She denies increase in severity with activity. She states she is occasionally SOB, but this could be due to interstitial lung dx. She has waited a while to call us bc she thought this could be indigestion. She would like to be seen in the office for evaluation. She has agreed to be seen by Oda Kilts, PA next week.   In the meantime, she understands if her sx worsen, are accompanied with increased SOB, diaphoresis, increase in CP without relief, she should report to the ED for evaluation and not wait for her appt.   She has verbalized understanding and had no additional questions.

## 2019-02-16 ENCOUNTER — Ambulatory Visit (INDEPENDENT_AMBULATORY_CARE_PROVIDER_SITE_OTHER): Payer: Medicare Other | Admitting: Student

## 2019-02-16 ENCOUNTER — Other Ambulatory Visit: Payer: Self-pay

## 2019-02-16 ENCOUNTER — Encounter: Payer: Self-pay | Admitting: Student

## 2019-02-16 VITALS — BP 140/72 | HR 71 | Ht 66.0 in | Wt 185.0 lb

## 2019-02-16 DIAGNOSIS — I4821 Permanent atrial fibrillation: Secondary | ICD-10-CM | POA: Diagnosis not present

## 2019-02-16 MED ORDER — DILTIAZEM HCL ER COATED BEADS 240 MG PO CP24: 240.0000 mg | ORAL_CAPSULE | Freq: Every day | ORAL | 1 refills | Status: DC

## 2019-02-16 NOTE — Patient Instructions (Addendum)
Medication Instructions:   START TAKING DILTIAZEM  240 MG ONCE A DAY   If you need a refill on your cardiac medications before your next appointment, please call your pharmacy.   Lab work: [place]   If you have labs (blood work) drawn today and your tests are completely normal, you will receive your results only by: [name] Kitchen MyChart Message (if you have MyChart) OR . A paper copy in the mail If you have any lab test that is abnormal or we need to change your treatment, we will call you to review the results.  Testing/Procedures: NONE ORDERED  TODAY    Follow-Up: At [facility], you and your health needs are our priority.  As part of our continuing mission to provide you with exceptional heart care, we have created designated Provider Care Teams.  These Care Teams include your primary Cardiologist (physician) and Advanced Practice Providers (APPs -  Physician Assistants and Nurse Practitioners) who all work together to provide you with the care you need, when you need it. You will need a follow up appointment in  6 months  Please call our office 2 months in advance to schedule this appointment.  You may see Dr. . Caryl Comes  or one of the following Electrical engineer on your designated Care Team:   Chanetta Marshall, NP . Tommye Standard, PA-C  Any Other Special Instructions Will Be Listed Below (If Applicable).

## 2019-02-16 NOTE — Progress Notes (Signed)
PCP:  [name], MD Primary Cardiologist: Virl Axe, MD Electrophysiologist: None   [name] is a 85 y.o. female who presents today for routine electrophysiology followup. They are seen for Dr Caryl Comes.   Since last being seen in our clinic, the patient reports doing well overall.  Though the past two months she has been having a mild, central chest pressure. She has no clear relieving or aggravating factors.  She says it feels like something is sitting "in" the center of her chest. She has a h/o of esphogeal dilatation due to dysphagia, but finds it difficult to compare the sensation as it was years ago.  It is not related to exertion, nor is it clearly related to food.  She is not sure if anxiety makes it worse.  The episodes last for minutes at a time, but usually only 2-3 times a week. She also complains of poor sleep, and low appetite. She eats usually one to two meals a day.   The patient feels that she is tolerating medications without difficulties and is otherwise without complaint today.   Past Medical History:  Diagnosis Date  . Anemia    - Hgb 9.7gm% on 07/13/2008 in [place] -  Hgg 129gm% wiht normal irone levsl and ferritin 10/27/2008 in [place] Recurrent otitis/sinusitis  . Anxiety    chronic BZ prn  . Atrial fibrillation (HCC)    chronic anticoag  . Bronchiectasis    >PFT 07/13/2008 in [place] 1.9L/76%, FVC 2.45L/74, Ratio 79, TLC 121%, DLCO 64%  AE BRonchiectasis - Dec 2010.New Rx:  outpatient - Feb 2011 - Rx outpatient  . CHF (congestive heart failure) ([place])   . COPD (chronic obstructive pulmonary disease) (HCC)    bronchiectasis  . Cricopharyngeal achalasia   . Depression   . Diverticulosis   . Dyslipidemia   . Eczema   . GERD with stricture   . Glaucoma   . H. pylori infection   . Hypertension   . Hyponatremia    chronic, s/p endo eval 06/2012  . Hypothyroid   . IBS (irritable bowel syndrome)   . Lumbar disc disease   . Segmental colitis ([place])   .  Vertigo    Past Surgical History:  Procedure Laterality Date  . BREAST SURGERY     br bx  . CARDIAC CATHETERIZATION    . CATARACT EXTRACTION     both  . COLONOSCOPY    . ESOPHAGOSCOPY W/ BOTOX INJECTION  12/11/2011   Procedure: ESOPHAGOSCOPY WITH BOTOX INJECTION;  Surgeon: Rozetta Nunnery, MD;  Location: [facility];  Service: ENT;  Laterality: N/A;  esophogoscopy with dilation, botox injection  . FOOT SURGERY  03/14/2011   gastroc slide-rt  . TOTAL ABDOMINAL HYSTERECTOMY      Current Outpatient Medications  Medication Sig Dispense Refill  . budesonide-formoterol (SYMBICORT) 80-4.5 MCG/ACT inhaler Inhale 2 puffs into the lungs 2 (two) times daily. 10.2 g 11  . Calcium-Magnesium-Zinc (CAL-MAG-ZINC PO) Take 1 tablet by mouth 3 (three) times daily.    . Cholecalciferol (VITAMIN D-3) 1000 units CAPS Take 4,000 Units by mouth daily.    . Cyanocobalamin (VITAMIN B-12 PO) Take 1 tablet by mouth daily.    [name] Kitchen dextromethorphan-guaiFENesin (MUCINEX DM) 30-600 MG 12hr tablet Take 1-2 tablets by mouth 2 (two) times daily as needed (for allegies/respiratory issues.).    [name] Kitchen diltiazem (CARDIZEM CD) 240 MG 24 hr capsule Take 1 capsule (240 mg total) by mouth daily. 90 capsule 1  .  FLUoxetine (PROZAC) 10 MG tablet Take 10 mg by mouth daily.    . fluticasone (FLONASE) 50 MCG/ACT nasal spray Place 1 spray into both nostrils 2 (two) times daily as needed (for nasal congestion).    . folic acid (FOLVITE) 1 MG tablet Take 1 mg by mouth daily.    . furosemide (LASIX) 20 MG tablet Take 10 mg by mouth.    . levothyroxine (SYNTHROID, LEVOTHROID) 50 MCG tablet Take 1 tablet (50 mcg total) by mouth daily. 90 tablet 3  . nebivolol (BYSTOLIC) 10 MG tablet Take 10 mg by mouth daily.    [name] Kitchen olmesartan (BENICAR) 40 MG tablet Take 40 mg by mouth daily.    [name] Kitchen omeprazole (PRILOSEC) 20 MG capsule TAKE 1 CAPSULE(20 MG) BY MOUTH DAILY 90 capsule 4  . pravastatin (PRAVACHOL) 40 MG tablet Take 40 mg by mouth  daily.    [name] Kitchen warfarin (COUMADIN) 2.5 MG tablet Take 1/2-1 tablet by mouth daily as directed by [facility] (Patient taking differently: Take 1.25-2.5 mg by mouth See admin instructions. Take 1 tablet (2.5 mg)  Monday, Wednesday,  Thursday & Saturday; Take 0.5 tablet (1.25 mg) Tuesday, Friday, & Sunday) 90 tablet 1   No current facility-administered medications for this visit.     Allergies  Allergen Reactions  . Doxycycline     Rash on face and neck  . Flagyl [Metronidazole] Nausea And Vomiting  . Moxifloxacin Other (See Comments)     Weakness/fatigue  . Pantoprazole Sodium Rash  . Penicillins Rash    Has patient had a PCN reaction causing immediate rash, facial/tongue/throat swelling, SOB or lightheadedness with hypotension:unsure Has patient had a PCN reaction causing severe rash involving mucus membranes or skin necrosis:unsure Has patient had a PCN reaction that required hospitalization:No Has patient had a PCN reaction occurring within the last 10 years:Yes Cannot recall exact reaction due to time lapse If all of the above answers are "NO", then may proceed with Cephalosporin use.     Social History   Socioeconomic History  . Marital status: Married    Spouse name: Not on file  . Number of children: 2  . Years of education: Not on file  . Highest education level: Not on file  Occupational History  . Occupation: retired Product manager: [employer]  Social Needs  . Financial resource strain: Not on file  . Food insecurity    Worry: Not on file    Inability: Not on file  . Transportation needs    Medical: Not on file    Non-medical: Not on file  Tobacco Use  . Smoking status: Never Smoker  . Smokeless tobacco: Never Used  Substance and Sexual Activity  . Alcohol use: No  . Drug use: No  . Sexual activity: Not on file  Lifestyle  . Physical activity    Days per week: Not on file    Minutes per session: Not on file  . Stress: Not on file  Relationships  .  Social Herbalist on phone: Not on file    Gets together: Not on file    Attends religious service: Not on file    Active member of club or organization: Not on file    Attends meetings of clubs or organizations: Not on file    Relationship status: Not on file  . Intimate partner violence    Fear of current or ex partner: Not on file    Emotionally abused: Not on file  Physically abused: Not on file    Forced sexual activity: Not on file  Other Topics Concern  . Not on file  Social History Narrative   2 children 4 grandchildren   Recently moved to [place] from [place]   Retired Pharmacist, hospital   Grew up in [place]. Moved to [place]. Moved to [place] Feb 2010 due to duaghther living in [place]. Son lives in [place], [place]. Husband works as Scientist, physiological in [place].     Review of Systems: General: No chills, fever, night sweats or weight changes  Cardiovascular:  No chest pain, dyspnea on exertion, edema, orthopnea, palpitations, paroxysmal nocturnal dyspnea Dermatological: No rash, lesions or masses Respiratory: No cough, dyspnea Urologic: No hematuria, dysuria Abdominal: No nausea, vomiting, diarrhea, bright red blood per rectum, melena, or hematemesis Neurologic: No visual changes, weakness, changes in mental status All other systems reviewed and are otherwise negative except as noted above.  Physical Exam: Vitals:   02/16/19 1356 02/16/19 1415  BP: (!) 158/82 140/72  Pulse: 71   Weight: 185 lb (83.9 kg)   Height: 5\' 6"  (1.676 m)     GEN- The patient is well appearing, alert and oriented x 3 today.   HEENT: normocephalic, atraumatic; sclera clear, conjunctiva pink; hearing intact; oropharynx clear; neck supple, no JVP Lymph- no cervical lymphadenopathy Lungs- Clear to ausculation bilaterally, normal work of breathing.  No wheezes, rales, rhonchi Heart- Regular rate and rhythm, no murmurs, rubs or gallops, PMI not laterally displaced GI- soft,  non-tender, non-distended, bowel sounds present, no hepatosplenomegaly Extremities- no clubbing, cyanosis, or edema; DP/PT/radial pulses 2+ bilaterally MS- no significant deformity or atrophy Skin- warm and dry, no rash or lesion Psych- euthymic mood, full affect Neuro- strength and sensation are intact  EKG is ordered today. Personal review shows Afib with PVCs at 71 bpm.  Assessment and Plan:  1. Atypical chest pain Non-exertional. No clear aggravating or relieving factors. With normal LHC 07/01/2013 and no change in risk factors, she has an overall low pre-test probability.  She herself questions if the pain is related to anxiety, and also describes the sensation as something "sitting" in the middle of her chest.  She has a h/o esophogeal dilatation due to dysphagia but this was many years ago and she finds it difficult to compare the sensations.  Pt knows to call back if the pain changes in quality, duration, or frequency.   2. Permanent Afib Rate appears controlled Denies bleeding on Warfarin  3. HTN Elevated on arrival but improved. She would prefer not to monitor at home due to anxiety Will increase diltiazem to 240 mg daily.   4. Low appetite Encouraged follow up with PCP. Will check BMET and CBC today.   Shirley Friar, PA-C  02/16/19 3:10 PM

## 2019-02-17 LAB — CBC
Hematocrit: 33.9 % — ABNORMAL LOW (ref 34.0–46.6)
Hemoglobin: 11.3 g/dL (ref 11.1–15.9)
MCH: 30.5 pg (ref 26.6–33.0)
MCHC: 33.3 g/dL (ref 31.5–35.7)
MCV: 92 fL (ref 79–97)
Platelets: 256 10*3/uL (ref 150–450)
RBC: 3.7 x10E6/uL — ABNORMAL LOW (ref 3.77–5.28)
RDW: 12.1 % (ref 11.7–15.4)
WBC: 7.9 10*3/uL (ref 3.4–10.8)

## 2019-02-17 LAB — BASIC METABOLIC PANEL
BUN/Creatinine Ratio: 17 (ref 12–28)
BUN: 13 mg/dL (ref 8–27)
CO2: 21 mmol/L (ref 20–29)
Calcium: 9.4 mg/dL (ref 8.7–10.3)
Chloride: 94 mmol/L — ABNORMAL LOW (ref 96–106)
Creatinine, Ser: 0.75 mg/dL (ref 0.57–1.00)
GFR calc Af Amer: 85 mL/min/{1.73_m2} (ref >59)
GFR calc non Af Amer: 74 mL/min/{1.73_m2} (ref >59)
Glucose: 97 mg/dL (ref 65–99)
Potassium: 4.7 mmol/L (ref 3.5–5.2)
Sodium: 130 mmol/L — ABNORMAL LOW (ref 134–144)

## 2019-03-13 DIAGNOSIS — Z23 Encounter for immunization: Secondary | ICD-10-CM | POA: Diagnosis not present

## 2019-03-13 DIAGNOSIS — I480 Paroxysmal atrial fibrillation: Secondary | ICD-10-CM | POA: Diagnosis not present

## 2019-03-13 DIAGNOSIS — Z7901 Long term (current) use of anticoagulants: Secondary | ICD-10-CM | POA: Diagnosis not present

## 2019-04-07 DIAGNOSIS — M1711 Unilateral primary osteoarthritis, right knee: Secondary | ICD-10-CM | POA: Diagnosis not present

## 2019-04-07 DIAGNOSIS — M1712 Unilateral primary osteoarthritis, left knee: Secondary | ICD-10-CM | POA: Diagnosis not present

## 2019-04-09 DIAGNOSIS — Z7901 Long term (current) use of anticoagulants: Secondary | ICD-10-CM | POA: Diagnosis not present

## 2019-04-09 DIAGNOSIS — I480 Paroxysmal atrial fibrillation: Secondary | ICD-10-CM | POA: Diagnosis not present

## 2019-04-11 ENCOUNTER — Other Ambulatory Visit: Payer: Self-pay | Admitting: Internal Medicine

## 2019-04-25 ENCOUNTER — Encounter (INDEPENDENT_AMBULATORY_CARE_PROVIDER_SITE_OTHER): Payer: Self-pay

## 2019-05-07 DIAGNOSIS — Z7901 Long term (current) use of anticoagulants: Secondary | ICD-10-CM | POA: Diagnosis not present

## 2019-05-07 DIAGNOSIS — I480 Paroxysmal atrial fibrillation: Secondary | ICD-10-CM | POA: Diagnosis not present

## 2019-05-12 DIAGNOSIS — H40013 Open angle with borderline findings, low risk, bilateral: Secondary | ICD-10-CM | POA: Diagnosis not present

## 2019-05-12 DIAGNOSIS — H43813 Vitreous degeneration, bilateral: Secondary | ICD-10-CM | POA: Diagnosis not present

## 2019-05-12 DIAGNOSIS — H35363 Drusen (degenerative) of macula, bilateral: Secondary | ICD-10-CM | POA: Diagnosis not present

## 2019-05-12 DIAGNOSIS — H35033 Hypertensive retinopathy, bilateral: Secondary | ICD-10-CM | POA: Diagnosis not present

## 2019-05-22 DIAGNOSIS — Z03818 Encounter for observation for suspected exposure to other biological agents ruled out: Secondary | ICD-10-CM | POA: Diagnosis not present

## 2019-05-22 DIAGNOSIS — J069 Acute upper respiratory infection, unspecified: Secondary | ICD-10-CM | POA: Diagnosis not present

## 2019-05-23 DIAGNOSIS — J069 Acute upper respiratory infection, unspecified: Secondary | ICD-10-CM | POA: Diagnosis not present

## 2019-06-01 ENCOUNTER — Other Ambulatory Visit: Payer: Self-pay

## 2019-06-01 ENCOUNTER — Encounter: Payer: Self-pay | Admitting: Internal Medicine

## 2019-06-01 ENCOUNTER — Ambulatory Visit (INDEPENDENT_AMBULATORY_CARE_PROVIDER_SITE_OTHER): Payer: Medicare Other | Admitting: Internal Medicine

## 2019-06-01 VITALS — BP 128/70 | HR 70 | Ht 66.0 in | Wt 190.2 lb

## 2019-06-01 DIAGNOSIS — J849 Interstitial pulmonary disease, unspecified: Secondary | ICD-10-CM

## 2019-06-01 NOTE — Patient Instructions (Signed)
ICD-10-CM   1. ILD (interstitial lung disease) ([place])  J84.9     I am concerned that the interstitial lung disease that has been stable for many years is now worse in the last 1 year  Plan -  -Do high-resolution CT chest supine and prone with inspiratory and expiratory volume phases   -do this in the next few to several weeks -Do overnight oxygen study    -do this in the next few to several weeks  -Do spirometry and DLCO with and without bronchodilator response  -Do this first available in January/February 2021  Follow-up -Return to see me in the next few to several weeks but after completing the CT scan and overnight oxygen study above   -next few to several weeks in a 30-minute slot  -No need to wait for the breathing test before seeing me -

## 2019-06-01 NOTE — Progress Notes (Signed)
OV 03/24/2015  Chief Complaint  Patient presents with  . Follow-up    Pt here after PFT in 01/2015. Pt using nocturnal O2 and has noticed a difference the next day in her breathing. Pt states she has a prod cough with discolored mucus, PND, sinus pressure.    Stephanie Cortez is here for follow-up with her daughter Stephanie Cortez, they are presenting for review of ILD workup. I'd always followed up with her baseline diagnosis of bronchiectasis but it started becoming more apparent that she actually has ILD. 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 According to the radiologist it has been stable since 2014 at least. Autoimmune workup showed elevated angiotensin-converting enzyme and CK but otherwise normal. Since the diagnosis of elevated CK her primary care physician has stopped her statin. There is no follow-up CK on this. We also notices that she had nocturnal desaturations restarted her on oxygen and sent her to pulmonary rehabilitation. These 2 measures haven't helped her immensely. She still continues a Symbicort for the prior diagnosis of bronchiectasis. She is inclined to continue that. She will have a flu shot today. She wants to know more about interstitial lung disease and had lot of questions about it  OV  08/11/2015: Acute office visit:  Patient presents to the office today with complaint of chest congestion and cough with yellow mucus intermittent wheezing and shortness of breath that has been ongoing for about 2-3 weeks, but per patient this has worsened over the last 2-3 days. Patient denies fever, chills, chest pain or hemoptysis. Patient endorses scant bloody mucus from her nose, that she feels this is probably related to her nocturnal oxygen use. She presented to her primary care physician Stephanie Cortez at [facility] and was treated initially with a Z-Pak. When Z-Pak did not resolve patient's symptoms she was then treated with clarithromycin 500 mg twice daily  starting 07/25/2015 for a 10 day treatment period . She is currently still taking this antibiotic. States since starting the clarithromycin that she has noticed some improvement.    OV 09/26/2015  Chief Complaint  Patient presents with  . Follow-up    Pt here after seeing Stephanie Cortez on 08/10/15 for an acute visit. Pt here after 31mwt and PFT. Pt states she is still not feeling well. Pt c/o headache, hoarseness, wheezing, prod cough. Pt denies increase in SOB and f/c/s.    Routine office visit for this lady with ILD/NSIP pattern (previous diagnosis of bronchiectasis and on Symbicort]. Has associated sinus problems. Her daughter Stephanie Cortez is not with her today.   85 year old female presents for follow-up. She is frustrated by her chronic sinus drainage and chronic sinus tension  in the frontal area. This is been going on for many months a few years. She seen Stephanie Cortez in ENT and apparently is planning a procedure. In the last several days to a few weeks she's having increasing headache particularly in the last 3 days. However there is no increase in sinus drainage volume or change in color of the sinus drainage [baseline color is much reading between white and yellow and green]  However in the last day or 2 she's increasing cough and wheezing despite taking Symbicort.  She is frustrated by her symptoms. She wants a definite answer currently same time she is reluctant to have surgical lung biopsy. She is also reluctant to have chronic daily prednisone because of side effects. She is also at the same time frustrated  about her extreme level of curiosity and need to search the Internet about her disease condition and then this works her into an anxiety      Spirometry today 09/26/2015 shows FVC 1.55 L/54%, FEV1 1.28 L/59% and a ratio of 83. This comes 6 weeks of her short prednisone burst. This a 300 mL decline in FVC since August 2016 but an improvement by 170 mL since June 2016   6 minute walk test today: Walk  336 m. Resting pulse ox was 98%. Peak exertion pulse ox was 94%. She had back pain but not shortness of breath.  Last CT scan of the chest was June 2016 which showed NSIP pattern  Last CT scan of the sinuses August 2013 that showed mild mucosal edema in the paranasal sinuses    OV 01/04/2016  Chief Complaint  Patient presents with  . Follow-up    Breathing has been doing well, same as usual.  Discuss oxygen, has not worn oxygen x 2 weeks.  patient only uses at night, discuss testing to get off oxygen.  Having surgery on spine in August, wants to discuss. Wheezing.    FU NSIP with air trapping and cough/wheeze - on symbicort (prior dx in 2015)  - Routine fu. Had annual HRCT - stable ILD with air trapping x 2 years. Overall well. Does not want to use nocturnal o2. But having wheeze and cough that is stable but says symbicort is not helping. Also, reports saysShe is having spine surgery to the low spine in the next few months by Stephanie Cortez and she wants know her preoperative pulmonary risk. She also wants to know the basis of her interstitial lung disease which we have discussed in the past but at this point in time her main issues symptoms. Walking desaturation test 185 feet 3 laps on room at in the office she did not desaturate   OV 02/20/2016  Chief Complaint  Patient presents with  . Acute Visit    Pt c/o prod cough with yellow mucus with scant amount of blood, increase in SOB, wheezing. Pt states the spiriva makes her cough.    FU NSIP with air trapping and cough/wheeze - on symbicort (prior dx in 2015).  Last visit in July 2017 because of air trapping we took her off Symbicort and tried Spiriva and Brio she called on 02/17/2016 increasing cough. She was rotated back to Symbicort. She clearly attributes the worsening cough and symptoms to Spiriva. She feels that after going on Symbicort she is better. However she is having some yellow mucus and feels she is having acute bronchitis. She  has multiple drug allergies listed below. Regarding her interstitial lung disease this is been stable for 2 years. Last visit walking desaturation test did not desaturate. She then had overnight oxygen study 02/07/2016 and results are below and she does not need nocturnal oxygen. Therefore she will not need sleep study  OV 04/04/2017  Chief Complaint  Patient presents with  . Follow-up    Pt states that she has been doing well. Breathing is at a stable point at this moment. Occ. cough and occ. SOB. Denies any CP.    Follow-up NSIP with a trapping and cough/wheeze and Symbicort responsive   83-year-ol overall stable. No new complaints. Not seen her in over a year. No interim health issues other than back pain for which she is avoiding back surgery. She is in need of a flu shot. She feels Symbicort is working well for her  and she wants to continue this. Without Symbicort she decompensated. Walking desaturation test 185 feet 3 laps on room air: Resting heart rate 63. Final 197. Resting pulse ox 100%. Final pulse ox 99%.   08/19/2017 acute extended ov/Wert re: acute cough in setting of flare of rhinitis? sinusitis Chief Complaint  Patient presents with  . Acute Visit    wheezing,SOB with activity,productive cough with green, thick sputum, has started mucinex   nasal symptoms flared  X one week prior to OV with facial pressure/ nasal congestion  Then worse cough/ wheeze with mucus turning green, some epistaxis but no green nasal d/c Not normally needing any kind of rescue rx / does not even have one  symptoms Worse hs and off and on during  noct / props up on several pillows helps some Not limited by breathing from desired activities  But very sedentary  rec Omnicef 300 mg twice daily x 10 days Prednisone 10 mg take  4 each am x 2 days,   2 each am x 2 days,  1 each am x 2 days and stop  I emphasized that nasal steroids (flonase) ave no immediate benefit in terms of improving symptoms.  To help  them reached the target tissue, the patient should use Afrin two puffs every 12 hours  Work on perfecting inhaler technique:   - late add: should notify coumadin adviser re 10 day course of abx       08/27/2017  Acute extended  ov/Wert re: refractory cough/ wheeze Chief Complaint  Patient presents with  . Acute Visit    cough and wheezing are unchanged since last visit. She is coughing up some green sputum- still taking omnicef.    Dyspnea:  No really sob Cough: less in volume, still green, some from nose/mostly from chest/ wakes her up each am / worse also when head hit pillow  Sleep: poorly / 45 degrees SABA use:  N/a Has flutter valve not suing   OV 11/20/2017    Chief Complaint  Patient presents with  . Follow-up    Pt supposed to have PFT but was unable to show for it. Pt saw MW twice February 2019 due to a cough. Pt states her cough is now better. Has some mild SOB.    Follow-up NSIP with a trapping and cough/wheeze and Symbicort responsive    Follow-up interstitial lung disease associated with air trapping and Symbicort responsive.  She presents for follow-up.  Since I saw her last in October 2018 she presented 2 times in February 2019 acutely to this office and treated with Omnicef and prednisone and subsequently symptomatic treatment.  Followed up with nurse practitioner in March.  Pulmonary function test recommended for today's visit but because of delays in her previous appointment at the Coumadin clinic she could not get it done.  But at this point in time she feels her cough is resolved back to baseline she feels good.  She feels Symbicort is helping her a lot.  Walking desaturation test today is essentially close to normal other than mild desaturation to 3 points.  There are no new issues.   OV 03/06/2018  Subjective:  Patient ID: Stephanie Cortez, female , DOB: 05/25/1934 , age 46 y.o. , MRN: [id number] , ADDRESS: [place] [id number]   03/06/2018 -    Chief Complaint  Patient presents with  . Follow-up    Pt states she has been doing okay since last visit and is mainly  here today to discuss prognosis and wants more information abotu her diagnosis. Pt states she has not been sleeping well and states she still has the dry cough. Pt states SOB is stable.     HPI Stephanie Cortez 72 y.o. -ast seen in May 2019. She was supposed to see her back in October 2019 with monitoring pulmonary function testing and high risk CT for her ILD not otherwise specified and associated possible cough variant asthma. Howevershe started thinking about this appointment and got extremely worried and anxious. She started looking at the Internet and she thought she might have IPF and she would die in the next few years.Therefore she is here to discuss this. Overall she's stable. She'll have a high dose flu shot. There are no new complaints. Back pain bothers her more This no worsening in dyspnea. There is no wheeze or cough     ROS - per HPI     OV 06/01/2019  Subjective:  Patient ID: Stephanie Cortez, female , DOB: 1934/04/24 , age 30 y.o. , MRN: [id number] , ADDRESS: [place] [id number]   06/01/2019 -   Chief Complaint  Patient presents with  . Follow-up    Pt states she has been doing okay since last visit. States she will become breathless faster than before.    Follow-up history of asthma and interstitial lung disease not otherwise specified  HPI Stephanie Cortez 59 y.o. -returns for follow-up.  Last seen September 2019.  At that point in time it was becoming more and more apparent that she had definite interstitial lung disease.  It was stable.  She was worried it might be IPF although the pattern did not fit.  Nevertheless she was stable.  I did recommend to her that she have a high-resolution CT chest and pulmonary function test as soon as possible and return in October 2019.  However she never did.  She does not have a satisfactory  answer as to why she did did not return.  It appears that was the end of 2019 her sister in [place] got ill and by 08/04/2018 she passed away.  Then COVID-19 pandemic here.  She says she has been social distancing at home.  Her daughter and visits with her.  She had a limited Thanksgiving with 6 people.  Everybody is doing well after Thanksgiving.  She has been masking as well.  Nevertheless she feels overall her shortness of breath is declined steadily in the last 1 year.  Symptom score is listed below.  In walking desaturation test she was only able to do 2 out of the 3 laps.  A year ago she was able to do all 3 laps.  Her pulse ox drop is worse than before.  She also has chronic cough and postnasal drainage and this is stable.  Most recent pulmonary function test and CT scan numbers are listed below.  Symptom score is listed below.    SYMPTOM SCALE - ILD 06/01/2019   O2 use RA  Shortness of Breath 0 -> 5 scale with 5 being worst (score 6 If unable to do)  At rest 0.25  Simple tasks - showers, clothes change, eating, shaving 1  Household (dishes, doing bed, laundry) 1  Shopping 2  Walking level at own pace 1  Walking keeping up with others of same age 16  Walking up Stairs 2.5  Walking up Hill 3.5  Total (40 - 48) Dyspnea Score 12.5  How bad  is your cough? slight  How bad is your fatigue mld        Simple office walk 185 feet x  3 laps goal with forehead probe 11/20/2017  06/01/2019   O2 used Room air RA  Number laps completed 3 laps 3 laps -was goal. Stopped at 2 laps due to severe dyspnea  Comments about pace Slow pace Slow pace  Resting Pulse Ox/HR 100% and 58/min 99% and 70.min  Final Pulse Ox/HR 97% and 88/min 93% and 110/min  Desaturated </= 88% no no  Desaturated <= 3% points yes Yes, 6 points  Got Tachycardic >/= 90/min no yes  Symptoms at end of test Mild dyspnea and back pain Moderate dyspnea and stopped prematurely  Miscellaneous comments Mild antalgic gait Back and  calf pain too   Results for Stephanie Cortez, Stephanie Cortez (MRN [id number]) as of 11/20/2017 11:40  Ref. Range 12/22/2014 10:11 03/02/2015 16:52 09/26/2015 10:04  FVC-Pre Latest Units: L 1.38 1.88 1.55  FVC-%Pred-Pre Latest Units: % 47 65 54   Results for Stephanie Cortez, Stephanie Cortez (MRN [id number]) as of 11/20/2017 11:40  Ref. Range 12/22/2014 10:11 03/02/2015 16:52  DLCO unc Latest Units: ml/min/mmHg 17.07 16.90  DLCO unc % pred Latest Units: % 63 62   IMPRESSION: 1. Interstitial lung disease characterized by upper lobe predominant patchy peribronchovascular and subpleural reticulation and faint ground-glass attenuation with associated mild traction bronchiectasis. No frank honeycombing. Extensive air trapping. These findings have not appreciably changed and are most consistent with chronic hypersensitivity pneumonitis. These findings are not consistent with usual interstitial pneumonia (UIP). 2. Two vessel coronary atherosclerosis.  Stable mild cardiomegaly. 3. Aortic atherosclerosis.   Electronically Signed   By: Ilona Sorrel M.D.   On: 12/26/2015 15:44  Results for Stephanie Cortez, Stephanie Cortez (MRN [id number]) as of 06/01/2019 10:58  Ref. Range 12/22/2014 12:33  Anti Nuclear Antibody (ANA) Latest Ref Range: NEGATIVE  NEG  Angiotensin-Converting Enzyme Latest Ref Range: 8 - 52 U/L 77 (H)  Cyclic Citrullin Peptide Ab Latest Ref Range: 0.0 - 5.0 U/mL <2.0  ds DNA Ab Latest Units: IU/mL <1  Myeloperoxidase Abs Latest Ref Range: <1.0 AI  <1.0  Serine Protease 3 Latest Ref Range: <1.0 AI  <1.0  RA Latex Turbid. Latest Ref Range: <=14 IU/mL 13  Ribonucleic Protein(ENA) Antibody, IgG Latest Ref Range: <1.0 NEG AI  <1.0 NEG  SSA (Ro) (ENA) Antibody, IgG Latest Ref Range: <1.0 NEG AI  <1.0 NEG  SSB (La) (ENA) Antibody, IgG Latest Ref Range: <1.0 NEG AI  <1.0 NEG  Scleroderma (Scl-70) (ENA) Antibody, IgG Latest Ref Range: <1.0 NEG AI  <1.0 NEG    ROS - per HPI     has a past medical history of Anemia, Anxiety, Atrial  fibrillation (HCC), Bronchiectasis, CHF (congestive heart failure) (HCC), COPD (chronic obstructive pulmonary disease) (HCC), Cricopharyngeal achalasia, Depression, Diverticulosis, Dyslipidemia, Eczema, GERD with stricture, Glaucoma, H. 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   reports that she has never smoked. She has never used smokeless tobacco.  Past Surgical History:  Procedure Laterality Date  . BREAST SURGERY     br bx  . CARDIAC CATHETERIZATION    . CATARACT EXTRACTION     both  . COLONOSCOPY    . ESOPHAGOSCOPY W/ BOTOX INJECTION  12/11/2011   Procedure: ESOPHAGOSCOPY WITH BOTOX INJECTION;  Surgeon: Rozetta Nunnery, MD;  Location: [facility];  Service: ENT;  Laterality: N/A;  esophogoscopy with dilation,  botox injection  . FOOT SURGERY  03/14/2011   gastroc slide-rt  . TOTAL ABDOMINAL HYSTERECTOMY      Allergies  Allergen Reactions  . Doxycycline     Rash on face and neck  . Flagyl [Metronidazole] Nausea And Vomiting  . Moxifloxacin Other (See Comments)     Weakness/fatigue  . Pantoprazole Sodium Rash  . Penicillins Rash    Has patient had a PCN reaction causing immediate rash, facial/tongue/throat swelling, SOB or lightheadedness with hypotension:unsure Has patient had a PCN reaction causing severe rash involving mucus membranes or skin necrosis:unsure Has patient had a PCN reaction that required hospitalization:No Has patient had a PCN reaction occurring within the last 10 years:Yes Cannot recall exact reaction due to time lapse If all of the above answers are "NO", then may proceed with Cephalosporin use.     Immunization History  Administered Date(s) Administered  . Influenza Split 04/06/2011, 04/03/2012  . Influenza Whole 06/01/2009, 04/03/2010  . Influenza, High Dose Seasonal PF 04/04/2017, 03/06/2018, 03/03/2019  . Influenza,inj,Quad  PF,6+ Mos 03/10/2013, 03/24/2015  . Influenza-Unspecified 04/01/2014  . Pneumococcal Polysaccharide-23 07/04/2006    Family History  Problem Relation Age of Onset  . Hypertension Mother   . Stroke Mother   . Emphysema Brother   . Other Father        miner's lung  . Cancer Father        Lung  . Colon polyps Sister   . Pancreatic cancer Sister   . Kidney disease Sister   . Atrial fibrillation Other        siblings     Current Outpatient Medications:  .  budesonide-formoterol (SYMBICORT) 80-4.5 MCG/ACT inhaler, Inhale 2 puffs into the lungs 2 (two) times daily., Disp: 10.2 g, Rfl: 11 .  BYSTOLIC 10 MG tablet, TAKE 1 TABLET ONCE DAILY., Disp: 30 tablet, Rfl: 9 .  Calcium-Magnesium-Zinc (CAL-MAG-ZINC PO), Take 1 tablet by mouth 3 (three) times daily., Disp: , Rfl:  .  Cholecalciferol (VITAMIN D-3) 1000 units CAPS, Take 4,000 Units by mouth daily., Disp: , Rfl:  .  Cyanocobalamin (VITAMIN B-12 PO), Take 1 tablet by mouth daily., Disp: , Rfl:  .  dextromethorphan-guaiFENesin (MUCINEX DM) 30-600 MG 12hr tablet, Take 1-2 tablets by mouth 2 (two) times daily as needed (for allegies/respiratory issues.)., Disp: , Rfl:  .  diltiazem (CARDIZEM CD) 240 MG 24 hr capsule, Take 1 capsule (240 mg total) by mouth daily., Disp: 90 capsule, Rfl: 1 .  FLUoxetine (PROZAC) 10 MG tablet, Take 10 mg by mouth daily., Disp: , Rfl:  .  fluticasone (FLONASE) 50 MCG/ACT nasal spray, Place 1 spray into both nostrils 2 (two) times daily as needed (for nasal congestion)., Disp: , Rfl:  .  folic acid (FOLVITE) 1 MG tablet, Take 1 mg by mouth daily., Disp: , Rfl:  .  furosemide (LASIX) 20 MG tablet, Take 10 mg by mouth., Disp: , Rfl:  .  levothyroxine (SYNTHROID, LEVOTHROID) 50 MCG tablet, Take 1 tablet (50 mcg total) by mouth daily., Disp: 90 tablet, Rfl: 3 .  olmesartan (BENICAR) 40 MG tablet, Take 40 mg by mouth daily., Disp: , Rfl:  .  omeprazole (PRILOSEC) 20 MG capsule, TAKE 1 CAPSULE(20 MG) BY MOUTH DAILY,  Disp: 90 capsule, Rfl: 4 .  pravastatin (PRAVACHOL) 40 MG tablet, Take 40 mg by mouth daily., Disp: , Rfl:  .  warfarin (COUMADIN) 2.5 MG tablet, Take 1/2-1 tablet by mouth daily as directed by [facility] Clinic (Patient taking differently: Take 1.25-2.5 mg  by mouth See admin instructions. Take 1 tablet (2.5 mg)  Monday, Wednesday,  Thursday & Saturday; Take 0.5 tablet (1.25 mg) Tuesday, Friday, & Sunday), Disp: 90 tablet, Rfl: 1      Objective:   Vitals:   06/01/19 1051  BP: 128/70  Pulse: 70  SpO2: 99%  Weight: 190 lb 3.2 oz (86.3 kg)  Height: 5\' 6"  (1.676 m)    Estimated body mass index is 30.7 kg/m as calculated from the following:   Height as of this encounter: 5\' 6"  (1.676 m).   Weight as of this encounter: 190 lb 3.2 oz (86.3 kg).  @WEIGHTCHANGE @  [employer]   06/01/19 1051  Weight: 190 lb 3.2 oz (86.3 kg)     Physical Exam  General Appearance:    Alert, cooperative, no distress, appears stated age - yes , Deconditioned looking - no , OBESE  - no, Sitting on Wheelchair -  no  Head:    Normocephalic, without obvious abnormality, atraumatic  Eyes:    PERRL, conjunctiva/corneas clear,  Ears:    Normal TM's and external ear canals, both ears  Nose:   Nares normal, septum midline, mucosa normal, no drainage    or sinus tenderness. OXYGEN ON  - no . Patient is @ ra   Throat: MASK  Neck:   Supple, symmetrical, trachea midline, no adenopathy;    thyroid:  no enlargement/tenderness/nodules; no carotid   bruit or JVD  Back:     Symmetric, no curvature, ROM normal, no CVA tenderness  Lungs:     Distress - no , Wheeze no, Barrell Chest - no, Purse lip breathing - no, Crackles - maybe at base and apices   Chest Wall:    No tenderness or deformity.    Heart:    Regular rate and rhythm, S1 and S2 normal, no rub   or gallop, Murmur - no  Breast Exam:    NOT DONE  Abdomen:     Soft, non-tender, bowel sounds active all four quadrants,    no masses, no organomegaly. Visceral  obesity - yes  Genitalia:   NOT DONE  Rectal:   NOT DONE  Extremities:   Extremities - normal, Has Cane - no, Clubbing - no, Edema - no  Pulses:   2+ and symmetric all extremities  Skin:   Stigmata of Connective Tissue Disease - no  Lymph nodes:   Cervical, supraclavicular, and axillary nodes normal  Psychiatric:  Neurologic:   Pleasant - yes, Anxious - no, Flat affect - no  CAm-ICU - neg, Alert and Oriented x 3 - yes, Moves all 4s - yes, Speech - normal, Cognition - intact           Assessment:       ICD-10-CM   1. ILD (interstitial lung disease) (HCC)  J84.9 Pulse oximetry, overnight    Pulmonary function test  2. Interstitial pulmonary disease ([place])  J84.9 CT Chest High Resolution       Plan:     Patient Instructions     ICD-10-CM   1. ILD (interstitial lung disease) ([place])  J84.9     I am concerned that the interstitial lung disease that has been stable for many years is now worse in the last 1 year  Plan -  -Do high-resolution CT chest supine and prone with inspiratory and expiratory volume phases   -do this in the next few to several weeks -Do overnight oxygen study    -do this in the next  few to several weeks  -Do spirometry and DLCO with and without bronchodilator response  -Do this first available in January/February 2021  Follow-up -Return to see me in the next few to several weeks but after completing the CT scan and overnight oxygen study above   -next few to several weeks in a 30-minute slot  -No need to wait for the breathing test before seeing me -   > 50% of this > 25 min visit spent in  face to face counseling or coordination of care - by this undersigned MD - Stephanie Brand Males. This includes one or more of the following documented above: discussion of test results, diagnostic or treatment recommendations, prognosis, risks and benefits of management options, instructions, education, compliance or risk-factor reduction    SIGNATURE    Stephanie.  Brand Males, M.D., F.C.C.P,  Pulmonary and Critical Care Medicine Staff Physician, [place] Director - Interstitial Lung Disease  Program  [facility] [place], [place], [address]  Pager: [phone], If no answer or between  15:00h - 7:00h: call [phone] Telephone: [phone]  11:27 AM 06/01/2019

## 2019-06-04 DIAGNOSIS — Z7901 Long term (current) use of anticoagulants: Secondary | ICD-10-CM | POA: Diagnosis not present

## 2019-06-04 DIAGNOSIS — I480 Paroxysmal atrial fibrillation: Secondary | ICD-10-CM | POA: Diagnosis not present

## 2019-06-08 DIAGNOSIS — J8489 Other specified interstitial pulmonary diseases: Secondary | ICD-10-CM | POA: Diagnosis not present

## 2019-06-09 ENCOUNTER — Telehealth: Payer: Self-pay | Admitting: Internal Medicine

## 2019-06-09 DIAGNOSIS — G4734 Idiopathic sleep related nonobstructive alveolar hypoventilation: Secondary | ICD-10-CM

## 2019-06-09 NOTE — Telephone Encounter (Signed)
Called pt and advised message from the provider. Pt understood and verbalized understanding. Nothing further is needed.   Order was sent to [facility].

## 2019-06-09 NOTE — Telephone Encounter (Signed)
Overnight oxygen study done on June 08, 2019 reviewed.  Total time less than or equal to 88% was 205 minutes.  Lowest pulse ox was 79%.  She also spent 10.8 minutes at less than or equal to 88% consecutively.  Her high pulse was 77 bpm.  Low pulse was 36 bpm.  Average pulse was 53 bpm.  She spent 79.6% of her time in bradycardia.  There was no time spent in tachycardia.  Plan -Start 2 L oxygen for ILD related overnight hypoxemia. -Discussed with cardiology about overnight bradycardia -Dr. Caryl Comes

## 2019-06-09 NOTE — Telephone Encounter (Signed)
Just signed the order to do ONO and gave to [place]

## 2019-06-09 NOTE — Telephone Encounter (Signed)
Dr. Chase Caller did you ever receive these results? They are stating the pt needs an order for 2l of oxygen at night. Please advise. Ashly stated he faxed the report yesterday. He also states the pt could possibly be qualified for 24 hour oxygen possibly.   Below 66% for 205 minutes. Baseline is at 88.6%

## 2019-06-17 ENCOUNTER — Other Ambulatory Visit: Payer: Self-pay | Admitting: Internal Medicine

## 2019-07-13 ENCOUNTER — Other Ambulatory Visit: Payer: Medicare Other

## 2019-07-15 ENCOUNTER — Telehealth: Payer: Self-pay

## 2019-07-15 NOTE — Telephone Encounter (Signed)
**Note De-Identified  Obfuscation** I started a Bystolic PA through covermymeds. Key: [id number]

## 2019-07-16 NOTE — Telephone Encounter (Signed)
**Note De-Identified  Obfuscation** Letter received from [employer] stating that they have denied the pts Bystolic PA. Reason: The pt must try and fail Bisoprolol or the prescribing MD must give specific medical reason why covered med is not appropriate for the patient.  Will forward this phone note to Dr Caryl Comes and his nurse for advisement to the pt.

## 2019-07-17 ENCOUNTER — Ambulatory Visit
Admission: RE | Admit: 2019-07-17 | Discharge: 2019-07-17 | Disposition: A | Discharge status: Home / Self Care | Payer: Medicare Other | Source: Ambulatory Visit | Attending: Internal Medicine | Admitting: Internal Medicine

## 2019-07-17 DIAGNOSIS — J8410 Pulmonary fibrosis, unspecified: Secondary | ICD-10-CM | POA: Diagnosis not present

## 2019-07-17 DIAGNOSIS — J849 Interstitial pulmonary disease, unspecified: Secondary | ICD-10-CM

## 2019-07-17 DIAGNOSIS — I517 Cardiomegaly: Secondary | ICD-10-CM | POA: Diagnosis not present

## 2019-07-17 NOTE — Telephone Encounter (Signed)
She was started on nebiviolol in [place],  I have no problem with her trying bisoprolol ( dose equivalent )  Thanks SK

## 2019-07-18 DIAGNOSIS — L303 Infective dermatitis: Secondary | ICD-10-CM | POA: Diagnosis not present

## 2019-07-20 ENCOUNTER — Other Ambulatory Visit: Payer: Self-pay | Admitting: Internal Medicine

## 2019-07-20 ENCOUNTER — Inpatient Hospital Stay (HOSPITAL_COMMUNITY): Admission: RE | Admit: 2019-07-20 | Payer: Medicare Other | Source: Ambulatory Visit

## 2019-07-20 MED ORDER — BISOPROLOL FUMARATE 10 MG PO TABS: ORAL_TABLET | ORAL | 1 refills | Status: DC

## 2019-07-20 MED ORDER — BISOPROLOL FUMARATE 10 MG PO TABS: 10.0000 mg | ORAL_TABLET | Freq: Every day | ORAL | 1 refills | Status: DC

## 2019-07-20 NOTE — Telephone Encounter (Signed)
**Note De-Identified  Obfuscation** The pt is advised and she verbalized understanding. Bystolic removed from the pts med list and Bisoprolol 10 mg daily was added.   Bisoprolol RX sent to [facility] to fill as requested.

## 2019-07-20 NOTE — Telephone Encounter (Signed)
She can change to bisoprolol 10mg  daily - would have her monitor her BP and HR at home for the next few weeks to make sure they remain stable.

## 2019-07-21 DIAGNOSIS — I10 Essential (primary) hypertension: Secondary | ICD-10-CM | POA: Diagnosis not present

## 2019-07-21 DIAGNOSIS — E782 Mixed hyperlipidemia: Secondary | ICD-10-CM | POA: Diagnosis not present

## 2019-07-21 DIAGNOSIS — M545 Low back pain: Secondary | ICD-10-CM | POA: Diagnosis not present

## 2019-07-21 DIAGNOSIS — I480 Paroxysmal atrial fibrillation: Secondary | ICD-10-CM | POA: Diagnosis not present

## 2019-07-21 DIAGNOSIS — Z7901 Long term (current) use of anticoagulants: Secondary | ICD-10-CM | POA: Diagnosis not present

## 2019-07-21 DIAGNOSIS — Z1389 Encounter for screening for other disorder: Secondary | ICD-10-CM | POA: Diagnosis not present

## 2019-07-21 DIAGNOSIS — E039 Hypothyroidism, unspecified: Secondary | ICD-10-CM | POA: Diagnosis not present

## 2019-07-21 DIAGNOSIS — F419 Anxiety disorder, unspecified: Secondary | ICD-10-CM | POA: Diagnosis not present

## 2019-07-21 DIAGNOSIS — J849 Interstitial pulmonary disease, unspecified: Secondary | ICD-10-CM | POA: Diagnosis not present

## 2019-07-21 DIAGNOSIS — R609 Edema, unspecified: Secondary | ICD-10-CM | POA: Diagnosis not present

## 2019-07-21 DIAGNOSIS — Z0000 Encounter for general adult medical examination without abnormal findings: Secondary | ICD-10-CM | POA: Diagnosis not present

## 2019-07-23 ENCOUNTER — Telehealth: Payer: Self-pay | Admitting: Internal Medicine

## 2019-07-23 NOTE — Progress Notes (Signed)
See phone note

## 2019-07-23 NOTE — Telephone Encounter (Addendum)
     Pls give jher CT report that shows progression of fibrosis (just as we thought) compared to past and after  Years of being stable  Plan  = pls schedule followoup with me feb or march 2021 . Ideal is 30 min slot - any day with ILD preferred

## 2019-07-27 NOTE — Telephone Encounter (Signed)
Attempted to call pt but unable to reach. Left message for pt to return call. 

## 2019-07-27 NOTE — Telephone Encounter (Signed)
Pt calling back [phone]

## 2019-07-28 ENCOUNTER — Other Ambulatory Visit (HOSPITAL_COMMUNITY): Payer: Medicare Other

## 2019-07-28 NOTE — Telephone Encounter (Signed)
Called and spoke with pt letting her know the info stated by MR about her CT and pt said she would like to have a copy of results mailed to her. Also scheduled pt a f/u visit with MR and also scheduled covid test which will need to be done prior to PFT. Nothing further needed.

## 2019-08-03 DIAGNOSIS — Z7901 Long term (current) use of anticoagulants: Secondary | ICD-10-CM | POA: Diagnosis not present

## 2019-08-03 DIAGNOSIS — I480 Paroxysmal atrial fibrillation: Secondary | ICD-10-CM | POA: Diagnosis not present

## 2019-08-11 ENCOUNTER — Ambulatory Visit: Payer: Self-pay | Admitting: Internal Medicine

## 2019-08-25 NOTE — Progress Notes (Signed)
Interstitial Lung Disease Multidisciplinary Conference   Stephanie Cortez    MRN [id number]    DOB [birth year]  Primary Care Physician:Smith, Hal Hope, MD  Referring Physician: [name] Lions  Time of Conference: 7.30am- 8.30am 0/209/2021 Date of conference: Virtual Location of Conference: -  Virtual  Participating Pulmonary: Dr. Brand Males, MD - yes,  Dr Marshell Garfinkel, MD - yes Pathology: Dr Jaquita Folds, MD - yes , Dr Enid Cutter - no Radiology: Dr Salvatore Marvel MD - yes, Dr Vinnie Langton MD - no,  Dr Lorin Picket, MD - no , Dr Eddie Candle - no Others: Alethia Berthold RN and Dr Kara Mead  Brief History:  -86 year old female recognized to have ILD since 2014.  Remote history of asthma/bronchiectasis.  But recognized to have ILD in 2014 and in  2016 it was considered stable.  Last pulmonary function test was in 2017 and it was stable  Serology: Elevated angiotensin-converting enzyme in the past but otherwise normal -this was in 2016.  In 2015 blood allergy work-up was normal including normal IgE..  MDD discussion of CT scan    - Date or time period of scan: Most recent high-resolution CT chest was July 17, 2019.  The early CT scan of the chest was August 2014.  - Features mentioned: The official report on the January 2021 CT scan of the chest suggested alternative to UIP and specifically chronic hypersensitivity pneumonitis.  In the conference the conference radiologist reported that in 2015 patient only had air trapping.  This persisted without change in 2017.  The subsequent CT scan of the chest was high-resolution in January 2021.  This shows dramatic change with upper lobe patchy groundglass opacity with upper lobe predominance and significantly worse.  These opacities are new in 2021.  The air trapping is severe.  - What is the final conclusion per 2018 ATS/Fleischner Criteria -alternative to UIP per ATS criteria.  Most consistent with hypersensitive pneumonitis.   Sarcoidosis is in the differential based on previous angiotensin-converting enzyme elevation but there is no adenopathy to support the diagnosis.  Pathology discussion of biopsy no pathology available:  PFTs: Results for Stephanie, Cortez (MRN [id number]) as of 08/25/2019 10:01  Ref. Range 12/22/2014 10:11 03/02/2015 16:52 09/26/2015 10:04  FVC-Pre Latest Units: L 1.38 1.88 1.55  FVC-%Pred-Pre Latest Units: % 47 65 54   Results for Stephanie, Cortez (MRN [id number]) as of 08/25/2019 10:01  Ref. Range 12/22/2014 10:11 03/02/2015 16:52  DLCO unc Latest Units: ml/min/mmHg 17.07 16.90  DLCO unc % pred Latest Units: % 63 62   Labs: x  MDD Impression/Recs:  -Radiologic pattern here is of hypersensitive pneumonitis.  The clinical recommendation is to do a detailed interstitial lung disease questionnaire and consider bronchoscopy with lavage with or without transbronchial biopsies for the patient.   Time Spent in preparation and discussion:  > 30 min    SIGNATURE   Dr. Brand Males, M.D., F.C.C.P,  Pulmonary and Critical Care Medicine Staff Physician, [place] Director - Interstitial Lung Disease  Program  [facility] [place] [facility] [place], [place], [address]  Pager: [phone], If no answer or between  15:00h - 7:00h: call [phone] Telephone: [phone]  10:07 AM 08/25/2019 ...................................................................................................................[name] Kitchen References: Diagnosis of Hypersensitivity Pneumonitis in Adults. An Official ATS/JRS/ALAT Clinical Practice Guideline. Ragu G et al, [place] Aug 1;202(3):e36-e69.       Diagnosis of  Idiopathic Pulmonary Fibrosis. An Official ATS/ERS/JRS/ALAT Clinical Practice Guideline. Raghu G et al, [place]. 2018 Sep 1;198(5):e44-e68.   IPF Suspected   Histopath ology Pattern      UIP  Probable UIP    Indeterminate for  UIP  Alternative  diagnosis    UIP  IPF  IPF  IPF  Non-IPF dx   HRCT   Probabe UIP  IPF  IPF  IPF (Likely)**  Non-IPF dx  Pattern  Indeterminate for UIP  IPF  IPF (Likely)**  Indeterminate  for IPF**  Non-IPF dx    Alternative diagnosis  IPF (Likely)**/ non-IPF dx  Non-IPF dx  Non-IPF dx  Non-IPF dx     Idiopathic pulmonary fibrosis diagnosis based upon HRCT and Biopsy paterns.  ** IPF is the likely diagnosis when any of following features are present:  . Moderate-to-severe traction bronchiectasis/bronchiolectasis (defined as mild traction bronchiectasis/bronchiolectasis in four or more lobes including the lingual as a lobe, or moderate to severe traction bronchiectasis in two or more lobes) in a man over age 25 years or in a woman over age 66 years . Extensive (>30%) reticulation on HRCT and an age >70 years  . Increased neutrophils and/or absence of lymphocytosis in BAL fluid  . Multidisciplinary discussion reaches a confident diagnosis of IPF.   **Indeterminate for IPF  . Without an adequate biopsy is unlikely to be IPF  . With an adequate biopsy may be reclassified to a more specific diagnosis after multidisciplinary discussion and/or additional consultation.   dx = diagnosis; HRCT = high-resolution computed tomography; IPF = idiopathic pulmonary fibrosis; UIP = usual interstitial pneumonia.

## 2019-08-31 DIAGNOSIS — Z7901 Long term (current) use of anticoagulants: Secondary | ICD-10-CM | POA: Diagnosis not present

## 2019-08-31 DIAGNOSIS — I480 Paroxysmal atrial fibrillation: Secondary | ICD-10-CM | POA: Diagnosis not present

## 2019-09-03 ENCOUNTER — Telehealth: Payer: Self-pay | Admitting: Internal Medicine

## 2019-09-03 ENCOUNTER — Encounter: Payer: Self-pay | Admitting: Internal Medicine

## 2019-09-03 ENCOUNTER — Other Ambulatory Visit: Payer: Self-pay

## 2019-09-03 ENCOUNTER — Ambulatory Visit (INDEPENDENT_AMBULATORY_CARE_PROVIDER_SITE_OTHER): Payer: Medicare Other | Admitting: Internal Medicine

## 2019-09-03 VITALS — BP 116/74 | HR 78 | Ht 66.0 in | Wt 191.2 lb

## 2019-09-03 DIAGNOSIS — J849 Interstitial pulmonary disease, unspecified: Secondary | ICD-10-CM

## 2019-09-03 DIAGNOSIS — Z01812 Encounter for preprocedural laboratory examination: Secondary | ICD-10-CM

## 2019-09-03 DIAGNOSIS — G4734 Idiopathic sleep related nonobstructive alveolar hypoventilation: Secondary | ICD-10-CM | POA: Diagnosis not present

## 2019-09-03 DIAGNOSIS — Z5181 Encounter for therapeutic drug level monitoring: Secondary | ICD-10-CM

## 2019-09-03 DIAGNOSIS — I2510 Atherosclerotic heart disease of native coronary artery without angina pectoris: Secondary | ICD-10-CM

## 2019-09-03 DIAGNOSIS — Z01811 Encounter for preprocedural respiratory examination: Secondary | ICD-10-CM

## 2019-09-03 NOTE — Telephone Encounter (Signed)
Thanks STeve. Reply not needed

## 2019-09-03 NOTE — Progress Notes (Signed)
OV 03/24/2015  Chief Complaint  Patient presents with  . Follow-up    Pt here after PFT in 01/2015. Pt using nocturnal O2 and has noticed a difference the next day in her breathing. Pt states she has a prod cough with discolored mucus, PND, sinus pressure.    Stephanie Cortez is here for follow-up with her daughter Stephanie Cortez, they are presenting for review of ILD workup. I'd always followed up with her baseline diagnosis of bronchiectasis but it started becoming more apparent that she actually has ILD. 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 According to the radiologist it has been stable since 2014 at least. Autoimmune workup showed elevated angiotensin-converting enzyme and CK but otherwise normal. Since the diagnosis of elevated CK her primary care physician has stopped her statin. There is no follow-up CK on this. We also notices that she had nocturnal desaturations restarted her on oxygen and sent her to pulmonary rehabilitation. These 2 measures haven't helped her immensely. She still continues a Symbicort for the prior diagnosis of bronchiectasis. She is inclined to continue that. She will have a flu shot today. She wants to know more about interstitial lung disease and had lot of questions about it  OV  08/11/2015: Acute office visit:  Patient presents to the office today with complaint of chest congestion and cough with yellow mucus intermittent wheezing and shortness of breath that has been ongoing for about 2-3 weeks, but per patient this has worsened over the last 2-3 days. Patient denies fever, chills, chest pain or hemoptysis. Patient endorses scant bloody mucus from her nose, that she feels this is probably related to her nocturnal oxygen use. She presented to her primary care physician Stephanie Cortez at [facility] and was treated initially with a Z-Pak. When Z-Pak did not resolve patient's symptoms she was then treated with clarithromycin 500 mg twice daily  starting 07/25/2015 for a 10 day treatment period . She is currently still taking this antibiotic. States since starting the clarithromycin that she has noticed some improvement.    OV 09/26/2015  Chief Complaint  Patient presents with  . Follow-up    Pt here after seeing SG on 08/10/15 for an acute visit. Pt here after 24mwt and PFT. Pt states she is still not feeling well. Pt c/o headache, hoarseness, wheezing, prod cough. Pt denies increase in SOB and f/c/s.    Routine office visit for this lady with ILD/NSIP pattern (previous diagnosis of bronchiectasis and on Symbicort]. Has associated sinus problems. Her daughter Stephanie Cortez is not with her today.   86 year old female presents for follow-up. She is frustrated by her chronic sinus drainage and chronic sinus tension  in the frontal area. This is been going on for many months a few years. She seen Dr. Lucia Cortez in ENT and apparently is planning a procedure. In the last several days to a few weeks she's having increasing headache particularly in the last 3 days. However there is no increase in sinus drainage volume or change in color of the sinus drainage [baseline color is much reading between white and yellow and green]  However in the last day or 2 she's increasing cough and wheezing despite taking Symbicort.  She is frustrated by her symptoms. She wants a definite answer currently same time she is reluctant to have surgical lung biopsy. She is also reluctant to have chronic daily prednisone because of side effects. She is also at the same time  frustrated about her extreme level of curiosity and need to search the Internet about her disease condition and then this works her into an anxiety      Spirometry today 09/26/2015 shows FVC 1.55 L/54%, FEV1 1.28 L/59% and a ratio of 83. This comes 6 weeks of her short prednisone burst. This a 300 mL decline in FVC since August 2016 but an improvement by 170 mL since June 2016   6 minute walk test today: Walk  336 m. Resting pulse ox was 98%. Peak exertion pulse ox was 94%. She had back pain but not shortness of breath.  Last CT scan of the chest was June 2016 which showed NSIP pattern  Last CT scan of the sinuses August 2013 that showed mild mucosal edema in the paranasal sinuses    OV 01/04/2016  Chief Complaint  Patient presents with  . Follow-up    Breathing has been doing well, same as usual.  Discuss oxygen, has not worn oxygen x 2 weeks.  patient only uses at night, discuss testing to get off oxygen.  Having surgery on spine in August, wants to discuss. Wheezing.    FU NSIP with air trapping and cough/wheeze - on symbicort (prior dx in 2015)  - Routine fu. Had annual HRCT - stable ILD with air trapping x 2 years. Overall well. Does not want to use nocturnal o2. But having wheeze and cough that is stable but says symbicort is not helping. Also, reports saysShe is having spine surgery to the low spine in the next few months by Dr Stephanie Cortez and she wants know her preoperative pulmonary risk. She also wants to know the basis of her interstitial lung disease which we have discussed in the past but at this point in time her main issues symptoms. Walking desaturation test 185 feet 3 laps on room at in the office she did not desaturate   OV 02/20/2016  Chief Complaint  Patient presents with  . Acute Visit    Pt c/o prod cough with yellow mucus with scant amount of blood, increase in SOB, wheezing. Pt states the spiriva makes her cough.    FU NSIP with air trapping and cough/wheeze - on symbicort (prior dx in 2015).  Last visit in July 2017 because of air trapping we took her off Symbicort and tried Spiriva and Brio she called on 02/17/2016 increasing cough. She was rotated back to Symbicort. She clearly attributes the worsening cough and symptoms to Spiriva. She feels that after going on Symbicort she is better. However she is having some yellow mucus and feels she is having acute bronchitis. She  has multiple drug allergies listed below. Regarding her interstitial lung disease this is been stable for 2 years. Last visit walking desaturation test did not desaturate. She then had overnight oxygen study 02/07/2016 and results are below and she does not need nocturnal oxygen. Therefore she will not need sleep study  OV 04/04/2017  Chief Complaint  Patient presents with  . Follow-up    Pt states that she has been doing well. Breathing is at a stable point at this moment. Occ. cough and occ. SOB. Denies any CP.    Follow-up NSIP with a trapping and cough/wheeze and Symbicort responsive   83-year-ol overall stable. No new complaints. Not seen her in over a year. No interim health issues other than back pain for which she is avoiding back surgery. She is in need of a flu shot. She feels Symbicort is working well for  her and she wants to continue this. Without Symbicort she decompensated. Walking desaturation test 185 feet 3 laps on room air: Resting heart rate 63. Final 197. Resting pulse ox 100%. Final pulse ox 99%.   08/19/2017 acute extended ov/Wert re: acute cough in setting of flare of rhinitis? sinusitis Chief Complaint  Patient presents with  . Acute Visit    wheezing,SOB with activity,productive cough with green, thick sputum, has started mucinex   nasal symptoms flared  X one week prior to OV with facial pressure/ nasal congestion  Then worse cough/ wheeze with mucus turning green, some epistaxis but no green nasal d/c Not normally needing any kind of rescue rx / does not even have one  symptoms Worse hs and off and on during  noct / props up on several pillows helps some Not limited by breathing from desired activities  But very sedentary  rec Omnicef 300 mg twice daily x 10 days Prednisone 10 mg take  4 each am x 2 days,   2 each am x 2 days,  1 each am x 2 days and stop  I emphasized that nasal steroids (flonase) ave no immediate benefit in terms of improving symptoms.  To help  them reached the target tissue, the patient should use Afrin two puffs every 12 hours  Work on perfecting inhaler technique:   - late add: should notify coumadin adviser re 10 day course of abx       08/27/2017  Acute extended  ov/Wert re: refractory cough/ wheeze Chief Complaint  Patient presents with  . Acute Visit    cough and wheezing are unchanged since last visit. She is coughing up some green sputum- still taking omnicef.    Dyspnea:  No really sob Cough: less in volume, still green, some from nose/mostly from chest/ wakes her up each am / worse also when head hit pillow  Sleep: poorly / 45 degrees SABA use:  N/a Has flutter valve not suing   OV 11/20/2017    Chief Complaint  Patient presents with  . Follow-up    Pt supposed to have PFT but was unable to show for it. Pt saw MW twice February 2019 due to a cough. Pt states her cough is now better. Has some mild SOB.    Follow-up NSIP with a trapping and cough/wheeze and Symbicort responsive    Follow-up interstitial lung disease associated with air trapping and Symbicort responsive.  She presents for follow-up.  Since I saw her last in October 2018 she presented 2 times in February 2019 acutely to this office and treated with Omnicef and prednisone and subsequently symptomatic treatment.  Followed up with nurse practitioner in March.  Pulmonary function test recommended for today's visit but because of delays in her previous appointment at the Coumadin clinic she could not get it done.  But at this point in time she feels her cough is resolved back to baseline she feels good.  She feels Symbicort is helping her a lot.  Walking desaturation test today is essentially close to normal other than mild desaturation to 3 points.  There are no new issues.   OV 03/06/2018  Subjective:  Patient ID: Stephanie Cortez, female , DOB: 13-Oct-1933 , age 25 y.o. , MRN: [id number] , ADDRESS: [place] [id number]   03/06/2018 -    Chief Complaint  Patient presents with  . Follow-up    Pt states she has been doing okay since last visit and is  mainly here today to discuss prognosis and wants more information abotu her diagnosis. Pt states she has not been sleeping well and states she still has the dry cough. Pt states SOB is stable.     HPI Stephanie Cortez 28 y.o. -ast seen in May 2019. She was supposed to see her back in October 2019 with monitoring pulmonary function testing and high risk CT for her ILD not otherwise specified and associated possible cough variant asthma. Howevershe started thinking about this appointment and got extremely worried and anxious. She started looking at the Internet and she thought she might have IPF and she would die in the next few years.Therefore she is here to discuss this. Overall she's stable. She'll have a high dose flu shot. There are no new complaints. Back pain bothers her more This no worsening in dyspnea. There is no wheeze or cough     ROS - per HPI     OV 06/01/2019  Subjective:  Patient ID: Stephanie Cortez, female , DOB: 06-11-1934 , age 30 y.o. , MRN: [id number] , ADDRESS: [place] [address]   06/01/2019 -   Chief Complaint  Patient presents with  . Follow-up    Pt states she has been doing okay since last visit. States she will become breathless faster than before.    Follow-up history of asthma and interstitial lung disease not otherwise specified  HPI Stephanie Cortez 89 y.o. -returns for follow-up.  Last seen September 2019.  At that point in time it was becoming more and more apparent that she had definite interstitial lung disease.  It was stable.  She was worried it might be IPF although the pattern did not fit.  Nevertheless she was stable.  I did recommend to her that she have a high-resolution CT chest and pulmonary function test as soon as possible and return in October 2019.  However she never did.  She does not have a satisfactory  answer as to why she did did not return.  It appears that was the end of 2019 her sister in [place] got ill and by 08/08/2018 she passed away.  Then COVID-19 pandemic here.  She says she has been social distancing at home.  Her daughter and visits with her.  She had a limited Thanksgiving with 6 people.  Everybody is doing well after Thanksgiving.  She has been masking as well.  Nevertheless she feels overall her shortness of breath is declined steadily in the last 1 year.  Symptom score is listed below.  In walking desaturation test she was only able to do 2 out of the 3 laps.  A year ago she was able to do all 3 laps.  Her pulse ox drop is worse than before.  She also has chronic cough and postnasal drainage and this is stable.  Most recent pulmonary function test and CT scan numbers are listed below.  Symptom score is listed below.       IMPRESSION: 1. Interstitial lung disease characterized by upper lobe predominant patchy peribronchovascular and subpleural reticulation and faint ground-glass attenuation with associated mild traction bronchiectasis. No frank honeycombing. Extensive air trapping. These findings have not appreciably changed and are most consistent with chronic hypersensitivity pneumonitis. These findings are not consistent with usual interstitial pneumonia (UIP). 2. Two vessel coronary atherosclerosis.  Stable mild cardiomegaly. 3. Aortic atherosclerosis.   Electronically Signed   By: Ilona Sorrel M.D.   On: 12/26/2015 15:44  Results for Blakesley,  Stephanie Cortez (MRN [id number]) as of 06/01/2019 10:58  Ref. Range 12/22/2014 12:33  Anti Nuclear Antibody (ANA) Latest Ref Range: NEGATIVE  NEG  Angiotensin-Converting Enzyme Latest Ref Range: 8 - 52 U/L 77 (H)  Cyclic Citrullin Peptide Ab Latest Ref Range: 0.0 - 5.0 U/mL <2.0  ds DNA Ab Latest Units: IU/mL <1  Myeloperoxidase Abs Latest Ref Range: <1.0 AI  <1.0  Serine Protease 3 Latest Ref Range: <1.0 AI  <1.0  RA Latex  Turbid. Latest Ref Range: <=14 IU/mL 13  Ribonucleic Protein(ENA) Antibody, IgG Latest Ref Range: <1.0 NEG AI  <1.0 NEG  SSA (Ro) (ENA) Antibody, IgG Latest Ref Range: <1.0 NEG AI  <1.0 NEG  SSB (La) (ENA) Antibody, IgG Latest Ref Range: <1.0 NEG AI  <1.0 NEG  Scleroderma (Scl-70) (ENA) Antibody, IgG Latest Ref Range: <1.0 NEG AI  <1.0 NEG     OV 09/03/2019  Subjective:  Patient ID: Stephanie Cortez, female , DOB: 1934-02-15 , age 23 y.o. , MRN: [id number] , ADDRESS: [place] [address]   09/03/2019 -   Chief Complaint  Patient presents with  . Follow-up    Pt states she wears O2 at night which she states is helping. Pt will still become SOB with exertion.   Follow-up interstitial lung disease not otherwise specified. MDD discussion 08/11/2019: Very concerning for hypersensitive pneumonitis.  HPI Stephanie Cortez 57 y.o. -last visit in our 2020 she reported worsening shortness of breath.  I was worried that her ILD was getting worse.  Therefore she did a high-resolution CT chest and it shows significant worsening especially with development of upper lobe groundglass opacities inflammatory findings with air trapping.  Very suggestive of hypersensitive pneumonitis.  She tells me that ever since moving from [place] to [facility] she is lived in the same home for 10 years.  In the year of COVID-19 in 2020 she has been mostly homebound.  There is mold in the basement and the basement is damp but she never goes there.  So she is puzzled about these findings.   [facility] Comprehensive ILD Questionnaire  Symptoms:  -Insidious onset of shortness of breath that is getting worse for the last several months.  Severity of dyspnea listed below.  She also has a cough from time to time.  The cough has been going on for years.  The cough is the same and ranges anywhere from mild to moderate.  She does cough at night some but not often.  She also brings up some phlegm but  not often.  The phlegm is clear to slightly yellow.  Never had hemoptysis does clear the throat.  Occasional wheezing present.  No nausea vomiting or diarrhea.   SYMPTOM SCALE - ILD 06/01/2019  09/03/2019    O2 use RA Night o2  Shortness of Breath 0 -> 5 scale with 5 being worst (score 6 If unable to do)   At rest 0.25 0  Simple tasks - showers, clothes change, eating, shaving 1 0  Household (dishes, doing bed, laundry) 1 0  Shopping 2 1.5  Walking level at own pace 1 0  Walking up Stairs 2.5 3  Total (40 - 48) Dyspnea Score 7.75 4.5  How bad is your cough? slight 1.5  How bad is your fatigue mld 1.5  nause  0  vomit  0  darrhea  0  anxiety  1  depressio  0      Past Medical History : She  got diagnosis COPD but she has ILD.  Likewise she got diagnosed with asthma but she has ILD.  There is in the past she has had diastolic heart failure and she has atrial fibrillation on Coumadin.  She has thyroid disease for the last few decades.  No collagen vascular disease no lupus no polymyositis no Sjogren's.  No diabetes n   ROS: Positive for arthralgia and fatigue.  No dysphagia no dry eyes no Raynaud's no nausea no vomiting.  Does have acid reflux occasionally.   FAMILY HISTORY of LUNG DISEASE: Denies.  Denies pulmonary fibrosis in the family.   EXPOSURE HISTORY: No smoke exposure.  No cigarettes no vaping no cocaine no marijuana no IV drug use no electronic cigarettes.   HOME and HOBBY DETAILS : She lives in a home that was built in the 63s.  She is lived in this home for [place] years [place] single-family.  The basement is damp and she thinks there is mold in it but she never goes there.  There is no mold or mildew in the shower curtain no mold or mildew in the bathroom does not use a humidifier.  No CPAP use no nebulizer use no steam iron use no misting Fountain use.  No pet birds or parakeets no pet gerbils.  She does not use any feather pillows or duvet.  She is not  tested of any mold in the [facility] duct system so she does not know/does not play any wind instruments.  No guarding habits.   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  Denies completely   PULMONARY TOXICITY HISTORY (27 items): She took amiodarone for short.  At age 24 but otherwise negative.       Simple office walk 185 feet x  3 laps goal with forehead probe 11/20/2017  06/01/2019  09/03/2019   O2 used Room air RA ra  Number laps completed 3 laps 3 laps -was goal. Stopped at 2 laps due to severe dyspnea 3 laps goal but only walked1.5 laps   Comments about pace Slow pace Slow pace avg pace  Resting Pulse Ox/HR 100% and 58/min 99% and 70.min 100% and 78/min  Final Pulse Ox/HR 97% and 88/min 93% and 110/min 92% and 104/min  Desaturated </= 88% no no no  Desaturated <= 3% points yes Yes, 6 points Yes, 8 points  Got Tachycardic >/= 90/min no yes yes  Symptoms at end of test Mild dyspnea and back pain Moderate dyspnea and stopped prematurely Severe dyspnea, stopped prematureley  Miscellaneous comments Mild antalgic gait Back and calf pain too    Results for Stephanie Cortez, Stephanie Cortez (MRN [id number]) as of 11/20/2017 11:40  Ref. Range 12/22/2014 10:11 03/02/2015 16:52 09/26/2015 10:04  FVC-Pre Latest Units: L 1.38 1.88 1.55  FVC-%Pred-Pre Latest Units: % 47 65 54   Results for Stephanie Cortez, Stephanie Cortez (MRN [id number]) as of 11/20/2017 11:40  Ref. Range 12/22/2014 10:11 03/02/2015 16:52  DLCO unc Latest Units: ml/min/mmHg 17.07 16.90  DLCO unc % pred Latest Units: % 63 62     IMPRESSION: 1. The appearance of the lungs is compatible with interstitial lung disease, with a spectrum of findings considered most compatible with an alternative diagnosis to usual interstitial pneumonia (UIP) per current ATS guidelines. Specifically, imaging findings are most suggestive of progressive chronic hypersensitivity pneumonitis. 2. Aortic atherosclerosis, in addition to left main and  right coronary artery disease. 3. Dilatation of the pulmonic trunk (3.6 cm in  diameter), concerning for pulmonary arterial hypertension. 4. Cardiomegaly with biatrial dilatation (right greater than left).  Aortic Atherosclerosis (ICD10-I70.0).   Electronically Signed   By: Vinnie Langton M.D.   On: 07/17/2019 15:19  ROS - per HPI     has a past medical history of Anemia, Anxiety, Atrial fibrillation ([place]), Bronchiectasis, CHF (congestive heart failure) ([place]), COPD (chronic obstructive pulmonary disease) ([place]), Cricopharyngeal achalasia, Depression, Diverticulosis, Dyslipidemia, Eczema, GERD with stricture, Glaucoma, H. pylori infection, Hypertension, Hyponatremia, Hypothyroid, IBS (irritable bowel syndrome), Lumbar disc disease, Segmental colitis ([place]), and Vertigo.   reports that she has never smoked. She has never used smokeless tobacco.  Past Surgical History:  Procedure Laterality Date  . BREAST SURGERY     br bx  . CARDIAC CATHETERIZATION    . CATARACT EXTRACTION     both  . COLONOSCOPY    . ESOPHAGOSCOPY W/ BOTOX INJECTION  12/11/2011   Procedure: ESOPHAGOSCOPY WITH BOTOX INJECTION;  Surgeon: Rozetta Nunnery, MD;  Location: [facility];  Service: ENT;  Laterality: N/A;  esophogoscopy with dilation, botox injection  . FOOT SURGERY  03/14/2011   gastroc slide-rt  . TOTAL ABDOMINAL HYSTERECTOMY      Allergies  Allergen Reactions  . Doxycycline     Rash on face and neck  . Flagyl [Metronidazole] Nausea And Vomiting  . Moxifloxacin Other (See Comments)     Weakness/fatigue  . Pantoprazole Sodium Rash  . Penicillins Rash    Has patient had a PCN reaction causing immediate rash, facial/tongue/throat swelling, SOB or lightheadedness with hypotension:unsure Has patient had a PCN reaction causing severe rash involving mucus membranes or skin necrosis:unsure Has patient had a PCN reaction that required hospitalization:No Has patient had a PCN reaction  occurring within the last 10 years:Yes Cannot recall exact reaction due to time lapse If all of the above answers are "NO", then may proceed with Cephalosporin use.     Immunization History  Administered Date(s) Administered  . Influenza Split 04/06/2011, 04/03/2012  . Influenza Whole 06/01/2009, 04/03/2010  . Influenza, High Dose Seasonal PF 04/04/2017, 03/06/2018, 03/03/2019  . Influenza,inj,Quad PF,6+ Mos 03/10/2013, 03/24/2015  . Influenza-Unspecified 04/01/2014  . Pneumococcal Polysaccharide-23 07/04/2006    Family History  Problem Relation Age of Onset  . Hypertension Mother   . Stroke Mother   . Emphysema Brother   . Other Father        miner's lung  . Cancer Father        Lung  . Colon polyps Sister   . Pancreatic cancer Sister   . Kidney disease Sister   . Atrial fibrillation Other        siblings     Current Outpatient Medications:  .  bisoprolol (ZEBETA) 10 MG tablet, Take 1 tablet by mouth daily. Please monitor your blood pressure and heart rate daily for a couple of weeks to make sure they remain stable., Disp: 90 tablet, Rfl: 1 .  Calcium-Magnesium-Zinc (CAL-MAG-ZINC PO), Take 1 tablet by mouth 3 (three) times daily., Disp: , Rfl:  .  Cholecalciferol (VITAMIN D-3) 1000 units CAPS, Take 4,000 Units by mouth daily., Disp: , Rfl:  .  Cyanocobalamin (VITAMIN B-12 PO), Take 1 tablet by mouth daily., Disp: , Rfl:  .  dextromethorphan-guaiFENesin (MUCINEX DM) 30-600 MG 12hr tablet, Take 1-2 tablets by mouth 2 (two) times daily as needed (for allegies/respiratory issues.)., Disp: , Rfl:  .  diltiazem (CARDIZEM CD) 240 MG 24 hr capsule, Take 1 capsule (240 mg total) by mouth  daily., Disp: 90 capsule, Rfl: 1 .  FLUoxetine (PROZAC) 10 MG tablet, Take 10 mg by mouth daily., Disp: , Rfl:  .  fluticasone (FLONASE) 50 MCG/ACT nasal spray, Place 1 spray into both nostrils 2 (two) times daily as needed (for nasal congestion)., Disp: , Rfl:  .  folic acid (FOLVITE) 1 MG tablet,  Take 1 mg by mouth daily., Disp: , Rfl:  .  furosemide (LASIX) 20 MG tablet, Take 10 mg by mouth., Disp: , Rfl:  .  levothyroxine (SYNTHROID, LEVOTHROID) 50 MCG tablet, Take 1 tablet (50 mcg total) by mouth daily., Disp: 90 tablet, Rfl: 3 .  olmesartan (BENICAR) 40 MG tablet, Take 40 mg by mouth daily., Disp: , Rfl:  .  omeprazole (PRILOSEC) 20 MG capsule, TAKE 1 CAPSULE(20 MG) BY MOUTH DAILY, Disp: 90 capsule, Rfl: 4 .  pravastatin (PRAVACHOL) 40 MG tablet, Take 40 mg by mouth daily., Disp: , Rfl:  .  SYMBICORT 80-4.5 MCG/ACT inhaler, INHALE 2 PUFFS INTO THE LUNGS TWICE DAILY, Disp: 10.2 g, Rfl: 3 .  warfarin (COUMADIN) 2.5 MG tablet, Take 1/2-1 tablet by mouth daily as directed by Coumadin Clinic (Patient taking differently: Take 1.25-2.5 mg by mouth See admin instructions. Take 1 tablet (2.5 mg)  Monday, Wednesday,  Thursday & Saturday; Take 0.5 tablet (1.25 mg) Tuesday, Friday, & Sunday), Disp: 90 tablet, Rfl: 1      Objective:   Vitals:   09/03/19 1216  BP: 116/74  Pulse: 78  SpO2: 100%  Weight: 191 lb 3.2 oz (86.7 kg)  Height: 5\' 6"  (1.676 m)    Estimated body mass index is 30.86 kg/m as calculated from the following:   Height as of this encounter: 5\' 6"  (1.676 m).   Weight as of this encounter: 191 lb 3.2 oz (86.7 kg).  @WEIGHTCHANGE @  [employer]   09/03/19 1216  Weight: 191 lb 3.2 oz (86.7 kg)     Physical Exam  Pleasant female.  BMI 30.  Bilateral upper lobe crackles/wheeze.  Abdomen soft normal heart sounds.  Alert and oriented x3.  No cyanosis no clubbing no edema.      Assessment:       ICD-10-CM   1. ILD (interstitial lung disease) ([place])  J84.9   2. Nocturnal hypoxemia  G47.34        Plan:     Patient Instructions     ICD-10-CM   1. ILD (interstitial lung disease) ([place])  J84.9   2. Nocturnal hypoxemia  G47.34      The interstitial lung disease that has been stable for many years is now worse in the last 1 year - and on CT Jan 2021 there is new  inflammation esp in upper lobes  WE suspect a condition called HP -hypersensitive pneumonitis.  It is possible that this some kind of offending "organic antigen" in new home environment  Glad the nighttime oxygen is helping you  Plan -  -Continue nighttime oxygen -Do interstitial lung disease questionnaire -preferably do it today and drop it off in the office front desk -Plan for bronchoscopy with lavage with or without biopsy sometime in March 2021  -We will let you know the date in the next few to several days  -Ahead of that we will have to stop your Coumadin and for this we will get clearance from Dr. Caryl Comes  -The risks, benefits and limitations of the biopsy procedure was explained and he verbalized understanding to proceed  -Keep the breathing test appointment later in March  2021  Follow-up -4-5 weeks from today with nurse practitioner [make this appointment even today so that we can discuss the bronchoscopy results at this visit]  -We will need the results of the bronchoscopy and the questionnaire to help you with the hypersensitivity pneumonitis diagnosis  -Treatment option at this visit would involve discussion of few to several months of steroid therapy and avoiding offending "antigen" if found  Addendum after the visit: We will ask her to do a mold evaluation in the house particularly in the basement in the [facility] duct system.   ( Level 05 visit: Estb 40-54 min in  visit type: on-site physical face to visit  in total care time and counseling or/and coordination of care by this undersigned MD - Dr Brand Males. This includes one or more of the following on this same day 09/03/2019: pre-charting, chart review, note writing, documentation discussion of test results, diagnostic or treatment recommendations, prognosis, risks and benefits of management options, instructions, education, compliance or risk-factor reduction. It excludes time spent by the Branch or office staff in the care of the  patient. Actual time 45 min)  D/w Dr Valeta Harms - recommends opd bronch at [facility] in our time slot  SIGNATURE    Dr. Brand Males, M.D., F.C.C.P,  Pulmonary and Critical Care Medicine Staff Physician, [place] Director - Interstitial Lung Disease  Program  Pulmonary [place] at [place], [place], [address]  Pager: [phone], If no answer or between  15:00h - 7:00h: call [phone] Telephone: [phone]  6:16 PM 09/03/2019

## 2019-09-03 NOTE — Telephone Encounter (Signed)
M  prob best about 5 days Resuming depends on perceived bleeding risk If high would wait a few days otherwise the day of procedure as will take 48-72 hrs to begin to move [facility] this helps SK

## 2019-09-03 NOTE — Patient Instructions (Addendum)
ICD-10-CM   1. ILD (interstitial lung disease) ([place])  J84.9   2. Nocturnal hypoxemia  G47.34      The interstitial lung disease that has been stable for many years is now worse in the last 1 year - and on CT Jan 2021 there is new inflammation esp in upper lobes  WE suspect a condition called HP -hypersensitive pneumonitis.  It is possible that this some kind of offending "organic antigen" in new home environment  Glad the nighttime oxygen is helping you  Plan -  -Continue nighttime oxygen -Do interstitial lung disease questionnaire -preferably do it today and drop it off in the office front desk -Plan for bronchoscopy with lavage with or without biopsy sometime in March 2021  -We will let you know the date in the next few to several days  -Ahead of that we will have to stop your Coumadin and for this we will get clearance from Dr. Caryl Comes  -The risks, benefits and limitations of the biopsy procedure was explained and he verbalized understanding to proceed  -Keep the breathing test appointment later in March 2021  Follow-up -4-5 weeks from today with nurse practitioner Barbra Sarks this appointment even today so that we can discuss the bronchoscopy results at this visit]  -We will need the results of the bronchoscopy and the questionnaire to help you with the hypersensitivity pneumonitis diagnosis  -Treatment option at this visit would involve discussion of few to several months of steroid therapy and avoiding offending "antigen" if found  Addendum after the visit: We will ask her to do a mold evaluation in the house particularly in the basement in the [facility] duct system.

## 2019-09-03 NOTE — Telephone Encounter (Signed)
Hi Stephanie Cortez needs a bronchoscopy because of suspected worsening interstitial lung disease possible hypersensitive pneumonitis.  To this and she will need her Coumadin to be on hold.  Question for you: How long can we hold the Coumadin before procedure and is it okay to hold Coumadin and if ever to do a transbronchial biopsy when can I restart the Coumadin  She is on it for A. fib but apparently the INR checks through the primary care physician  Thanks    SIGNATURE    Dr. Brand Males, M.D., F.C.C.P,  Pulmonary and Critical Care Medicine Staff Physician, [place] Director - Interstitial Lung Disease  Program  Pulmonary [place] at [place], [place], [address]  Pager: [phone], If no answer or between  15:00h - 7:00h: call [phone] Telephone: [phone]  12:56 PM 09/03/2019

## 2019-09-03 NOTE — Telephone Encounter (Signed)
    Stephanie Cortez  1. Please tell PRACHI ASSINK to do a formal mold evaluatio in the basement, and AC duct system 0 usually out o fpocket and she has to afford it.   2. Please schedule the following through [facility]  Diagnosis: ILD Procedure: bronchoscoy with BAL and +/- transbronchial biopsy  Date: 3/22, 3/23, 3/24, 3/25 and 3/26 at [place] Alternate Date: as above - 3/15 and 3/16 though less preferred Time: AM - ok/ PM early PM Location: [place] Medication Restriction: come npo but can take meds . Needs to stop copumadin 5 days before Anticoagulate/Antiplatelet: stop coumadin 5 days before Pre-op Labs Ordered: CBC, CMP, PT/INR, PTT - get 1 -2 days pre procedure Imaging request: none  Need - anesthesia for LMA support due to 86 y.o. and    (Please coordinate Pre-op COVID Testing    Current Outpatient Medications:  .  bisoprolol (ZEBETA) 10 MG tablet, Take 1 tablet by mouth daily. Please monitor your blood pressure and heart rate daily for a couple of weeks to make sure they remain stable., Disp: 90 tablet, Rfl: 1 .  Calcium-Magnesium-Zinc (CAL-MAG-ZINC PO), Take 1 tablet by mouth 3 (three) times daily., Disp: , Rfl:  .  Cholecalciferol (VITAMIN D-3) 1000 units CAPS, Take 4,000 Units by mouth daily., Disp: , Rfl:  .  Cyanocobalamin (VITAMIN B-12 PO), Take 1 tablet by mouth daily., Disp: , Rfl:  .  dextromethorphan-guaiFENesin (MUCINEX DM) 30-600 MG 12hr tablet, Take 1-2 tablets by mouth 2 (two) times daily as needed (for allegies/respiratory issues.)., Disp: , Rfl:  .  diltiazem (CARDIZEM CD) 240 MG 24 hr capsule, Take 1 capsule (240 mg total) by mouth daily., Disp: 90 capsule, Rfl: 1 .  FLUoxetine (PROZAC) 10 MG tablet, Take 10 mg by mouth daily., Disp: , Rfl:  .  fluticasone (FLONASE) 50 MCG/ACT nasal spray, Place 1 spray into both nostrils 2 (two) times daily as needed (for nasal congestion)., Disp: , Rfl:  .  folic acid (FOLVITE) 1 MG tablet, Take 1 mg by mouth daily., Disp: , Rfl:  .   furosemide (LASIX) 20 MG tablet, Take 10 mg by mouth., Disp: , Rfl:  .  levothyroxine (SYNTHROID, LEVOTHROID) 50 MCG tablet, Take 1 tablet (50 mcg total) by mouth daily., Disp: 90 tablet, Rfl: 3 .  olmesartan (BENICAR) 40 MG tablet, Take 40 mg by mouth daily., Disp: , Rfl:  .  omeprazole (PRILOSEC) 20 MG capsule, TAKE 1 CAPSULE(20 MG) BY MOUTH DAILY, Disp: 90 capsule, Rfl: 4 .  pravastatin (PRAVACHOL) 40 MG tablet, Take 40 mg by mouth daily., Disp: , Rfl:  .  SYMBICORT 80-4.5 MCG/ACT inhaler, INHALE 2 PUFFS INTO THE LUNGS TWICE DAILY, Disp: 10.2 g, Rfl: 3 .  warfarin (COUMADIN) 2.5 MG tablet, Take 1/2-1 tablet by mouth daily as directed by Coumadin Clinic (Patient taking differently: Take 1.25-2.5 mg by mouth See admin instructions. Take 1 tablet (2.5 mg)  Monday, Wednesday,  Thursday & Saturday; Take 0.5 tablet (1.25 mg) Tuesday, Friday, & Sunday), Disp: 90 tablet, Rfl: 1

## 2019-09-04 NOTE — Telephone Encounter (Signed)
Golden Circle, is there any way you can help me out with getting this scheduled for pt please.

## 2019-09-07 NOTE — Telephone Encounter (Signed)
Raquel Sarna routed to [facility] b/c MR stated the following:  2. Please schedule the following through [facility]  Diagnosis: ILD Procedure: bronchoscoy with BAL and +/- transbronchial biopsy  Date: 3/22, 3/23, 3/24, 3/25 and 3/26 at [place] Alternate Date: as above - 3/15 and 3/16 though less preferred Time: AM - ok/ PM early PM Location: [place] Medication Restriction: come npo but can take meds . Needs to stop copumadin 5 days before Anticoagulate/Antiplatelet: stop coumadin 5 days before Pre-op Labs Ordered: CBC, CMP, PT/INR, PTT - get 1 -2 days pre procedure Imaging request: none  Need - anesthesia for LMA support due to 86 y.o. and

## 2019-09-07 NOTE — Telephone Encounter (Addendum)
LMTCB for the pt  I have placed orders for labs and for the bronch and will forward to [place] to f/u on thanks

## 2019-09-07 NOTE — Telephone Encounter (Signed)
I really don't know of any such program for this . I do know [facility] has some sort of financial help but I do know that [id number] Stephanie Cortez this will help Stephanie Cortez

## 2019-09-07 NOTE — Telephone Encounter (Signed)
Please place  A order in the pcc work que for this thanks libby

## 2019-09-07 NOTE — Telephone Encounter (Signed)
Going to also route this to [facility] pool.

## 2019-09-08 NOTE — Telephone Encounter (Signed)
Spoke with [facility]. Pt has been scheduled for bronch 3/23 at 1/15 at [facility].  Routing to MR.

## 2019-09-12 ENCOUNTER — Inpatient Hospital Stay (HOSPITAL_COMMUNITY): Admission: RE | Admit: 2019-09-12 | Payer: Medicare Other | Source: Ambulatory Visit

## 2019-09-19 ENCOUNTER — Other Ambulatory Visit (HOSPITAL_COMMUNITY)
Admission: RE | Admit: 2019-09-19 | Discharge: 2019-09-19 | Disposition: A | Discharge status: Home / Self Care | Payer: Medicare Other | Source: Ambulatory Visit | Attending: Internal Medicine | Admitting: Internal Medicine

## 2019-09-19 DIAGNOSIS — Z20822 Contact with and (suspected) exposure to covid-19: Secondary | ICD-10-CM | POA: Insufficient documentation

## 2019-09-19 DIAGNOSIS — Z01812 Encounter for preprocedural laboratory examination: Secondary | ICD-10-CM | POA: Insufficient documentation

## 2019-09-19 LAB — SARS CORONAVIRUS 2 (TAT 6-24 HRS): SARS Coronavirus 2: NEGATIVE

## 2019-09-21 ENCOUNTER — Telehealth: Payer: Self-pay | Admitting: Internal Medicine

## 2019-09-21 NOTE — Telephone Encounter (Signed)
1. Holding coumadn - oops. Is ok  Plan  - cancel procedure for 09/22/19 - can they do it at [facility] on April 20 or 22 or 23? -I am at 4N. Need anestehsia support - meanwhile she has to do mold eval of her home

## 2019-09-21 NOTE — Telephone Encounter (Signed)
Called and spoke to pt. Informed her of the recs per Dr. Chase Caller. Pt states she is in the process of getting a mold eval for her home. Called central scheduling at [phone] and canceled bronch for 09/22/2019 and rescheduled for [facility] on 10/20/19 at 0730.   Dr. Chase Caller please advise if time/date works.

## 2019-09-21 NOTE — Telephone Encounter (Signed)
See other message. This has to be cancelled 09/22/19 becase she forgot to stop her coumading=

## 2019-09-21 NOTE — Telephone Encounter (Signed)
Called and spoke to pt. Pt states she forgot to HOLD her Coumadin, this was suppose to be held 5 days prior to procedure. Pt is scheduled for a bronch on 3/23. Pt extremely apologetic.   Dr. Chase Caller please advise when you would like to reschedule this. Thanks.

## 2019-09-23 ENCOUNTER — Other Ambulatory Visit: Payer: Self-pay

## 2019-09-23 ENCOUNTER — Ambulatory Visit (INDEPENDENT_AMBULATORY_CARE_PROVIDER_SITE_OTHER): Payer: Medicare Other | Admitting: Internal Medicine

## 2019-09-23 DIAGNOSIS — J849 Interstitial pulmonary disease, unspecified: Secondary | ICD-10-CM | POA: Diagnosis not present

## 2019-09-23 LAB — PULMONARY FUNCTION TEST
DL/VA % pred: 101 %
DL/VA: 4.05 ml/min/mmHg/L
DLCO cor % pred: 46 %
DLCO cor: 9.24 ml/min/mmHg
DLCO unc % pred: 46 %
DLCO unc: 9.24 ml/min/mmHg
FEF 25-75 Post: 1.52 L/sec
FEF 25-75 Pre: 1.4 L/sec
FEF2575-%Change-Post: 8 %
FEF2575-%Pred-Post: 115 %
FEF2575-%Pred-Pre: 106 %
FEV1-%Change-Post: 1 %
FEV1-%Pred-Post: 57 %
FEV1-%Pred-Pre: 56 %
FEV1-Post: 1.15 L
FEV1-Pre: 1.13 L
FEV1FVC-%Change-Post: 0 %
FEV1FVC-%Pred-Pre: 118 %
FEV6-%Change-Post: 0 %
FEV6-%Pred-Post: 51 %
FEV6-%Pred-Pre: 51 %
FEV6-Post: 1.31 L
FEV6-Pre: 1.3 L
FEV6FVC-%Pred-Post: 106 %
FEV6FVC-%Pred-Pre: 106 %
FVC-%Change-Post: 0 %
FVC-%Pred-Post: 48 %
FVC-%Pred-Pre: 48 %
FVC-Post: 1.31 L
FVC-Pre: 1.3 L
Post FEV1/FVC ratio: 87 %
Post FEV6/FVC ratio: 100 %
Pre FEV1/FVC ratio: 86 %
Pre FEV6/FVC Ratio: 100 %

## 2019-09-23 NOTE — Progress Notes (Signed)
Spirometry pre and post and Dlco done today. ?

## 2019-09-28 ENCOUNTER — Ambulatory Visit: Payer: Medicare Other | Admitting: Internal Medicine

## 2019-09-28 NOTE — Telephone Encounter (Signed)
Spoke with patient to let her know we were waiting for Dr. Chase Caller response on a good day to rescheduled. Patient asked for not first thing in morning or on the 10/21/19 as she is having a dental procedure.  She said the 20th would be good if she could get it later, but if not that was fine too.   MR please advise on rescheduling this patient.

## 2019-09-28 NOTE — Telephone Encounter (Signed)
Pt is ready to reschedule bronch-- please call to arrange.

## 2019-09-29 DIAGNOSIS — Z7901 Long term (current) use of anticoagulants: Secondary | ICD-10-CM | POA: Diagnosis not present

## 2019-09-29 DIAGNOSIS — I480 Paroxysmal atrial fibrillation: Secondary | ICD-10-CM | POA: Diagnosis not present

## 2019-10-01 ENCOUNTER — Ambulatory Visit: Payer: Medicare Other | Admitting: Student

## 2019-10-01 ENCOUNTER — Ambulatory Visit: Payer: Medicare Other | Admitting: Adult Health

## 2019-10-05 NOTE — Progress Notes (Deleted)
Cardiology Office Note Date:  10/05/2019  Patient ID:  Stephanie Cortez, Stephanie Cortez 15-Sep-1933, MRN [id number] PCP:  [name], MD  Cardiologist:  Dr. Graceann Congress, MD (Gastroenterology) Brand Males, MD (Pulmonary Disease) Renato Shin, MD (Endocrinology) Erline Levine, MD (Neurosurgery) Weber Cooks, MD (Orthopedic Surgery) Rozetta Nunnery, MD (Otolaryngology)    Chief Complaint: *** advised by her PMD to have follow up ?    History of Present Illness: Stephanie Cortez is a 86 y.o. female with history of  Permanent AFib (historically failed multiple DCCV and AAD), bronchiectasis, COPD, HTN, CBP, anhalasia, IBS/colitis  She comes in today to be seen for Dr. Caryl Comes, last seen by him via tele-health visit June 2020.  She was doing well, following with pulmonary.  No changes were made.  Recommended annual visit.  Her last visit with pulmonary noted marked worsening in her CT, with associated worsening in her SOB.  Suspected hypersensitive pneumonitis.  Continued with HS oxygen, planned for bronch.  Dr. Caryl Comes addressed warfarin for this already.  *** who manages her warfarin? *** card symptoms? *** labs, lipids *** bleeding, warfarin *** h/o orthostatic sx *** HFpEF, volume   Past Medical History:  Diagnosis Date  . Anemia    - Hgb 9.7gm% on 07/13/2008 in [place] -  Hgg 129gm% wiht normal irone levsl and ferritin 10/27/2008 in [place] Recurrent otitis/sinusitis  . Anxiety    chronic BZ prn  . Atrial fibrillation (HCC)    chronic anticoag  . Bronchiectasis    >PFT 07/13/2008 in [place] 1.9L/76%, FVC 2.45L/74, Ratio 79, TLC 121%, DLCO 64%  AE BRonchiectasis - Dec 2010.New Rx:  outpatient - Feb 2011 - Rx outpatient  . CHF (congestive heart failure) ([place])   . COPD (chronic obstructive pulmonary disease) (HCC)    bronchiectasis  . Cricopharyngeal achalasia   . Depression   . Diverticulosis   . Dyslipidemia   . Eczema   . GERD with stricture   . Glaucoma   .  H. pylori infection   . Hypertension   . Hyponatremia    chronic, s/p endo eval 06/2012  . Hypothyroid   . IBS (irritable bowel syndrome)   . Lumbar disc disease   . Segmental colitis ([place])   . Vertigo     Past Surgical History:  Procedure Laterality Date  . BREAST SURGERY     br bx  . CARDIAC CATHETERIZATION    . CATARACT EXTRACTION     both  . COLONOSCOPY    . ESOPHAGOSCOPY W/ BOTOX INJECTION  12/11/2011   Procedure: ESOPHAGOSCOPY WITH BOTOX INJECTION;  Surgeon: Rozetta Nunnery, MD;  Location: [place];  Service: ENT;  Laterality: N/A;  esophogoscopy with dilation, botox injection  . FOOT SURGERY  03/14/2011   gastroc slide-rt  . TOTAL ABDOMINAL HYSTERECTOMY      Current Outpatient Medications  Medication Sig Dispense Refill  . Biotin 5000 MCG TABS Take 5,000 mcg by mouth daily.    . bisoprolol (ZEBETA) 10 MG tablet Take 1 tablet by mouth daily. Please monitor your blood pressure and heart rate daily for a couple of weeks to make sure they remain stable. (Patient taking differently: Take 10 mg by mouth daily. Please monitor your blood pressure and heart rate daily for a couple of weeks to make sure they remain stable.) 90 tablet 1  . Cholecalciferol (VITAMIN D-3) 125 MCG (5000 UT) TABS Take 5,000 Units by mouth daily.     [name] Kitchen dextromethorphan-guaiFENesin (  MUCINEX DM) 30-600 MG 12hr tablet Take 1-2 tablets by mouth 2 (two) times daily as needed (for allegies/respiratory issues.).    [name] Kitchen diltiazem (CARDIZEM CD) 240 MG 24 hr capsule Take 1 capsule (240 mg total) by mouth daily. (Patient taking differently: Take 240 mg by mouth at bedtime. ) 90 capsule 1  . diphenhydrAMINE (BENADRYL) 25 MG tablet Take 25 mg by mouth every 6 (six) hours as needed for allergies.    [name] Kitchen FLUoxetine (PROZAC) 10 MG tablet Take 10 mg by mouth 2 (two) times daily as needed (anxiety).     . fluticasone (FLONASE) 50 MCG/ACT nasal spray Place 1 spray into both nostrils 2 (two) times daily as  needed (for nasal congestion).    . folic acid (FOLVITE) A999333 MCG tablet Take 1 mg by mouth 3 (three) times a week.     . furosemide (LASIX) 20 MG tablet Take 20 mg by mouth daily as needed for fluid.     [name] Kitchen levothyroxine (SYNTHROID, LEVOTHROID) 50 MCG tablet Take 1 tablet (50 mcg total) by mouth daily. (Patient taking differently: Take 50 mcg by mouth daily before breakfast. ) 90 tablet 3  . olmesartan (BENICAR) 40 MG tablet Take 40 mg by mouth daily.    [name] Kitchen omeprazole (PRILOSEC) 20 MG capsule TAKE 1 CAPSULE(20 MG) BY MOUTH DAILY (Patient taking differently: Take 20 mg by mouth daily. ) 90 capsule 4  . pravastatin (PRAVACHOL) 40 MG tablet Take 40 mg by mouth at bedtime.     . SYMBICORT 80-4.5 MCG/ACT inhaler INHALE 2 PUFFS INTO THE LUNGS TWICE DAILY (Patient taking differently: Inhale 2 puffs into the lungs in the morning and at bedtime. ) 10.2 g 3  . vitamin B-12 (CYANOCOBALAMIN) 1000 MCG tablet Take 1,000 mcg by mouth 3 (three) times a week.     . warfarin (COUMADIN) 2.5 MG tablet Take 1/2-1 tablet by mouth daily as directed by Coumadin Clinic (Patient taking differently: Take 1.25-2.5 mg by mouth See admin instructions. Take 1 tablet (2.5 mg)  Tuesday Thursday & Saturday Sunday Take 0.5 tablet (1.25 mg) Monday. Wednesday & Friday) 90 tablet 1  . zinc gluconate 50 MG tablet Take 50 mg by mouth daily.     No current facility-administered medications for this visit.    Allergies:   Doxycycline, Flagyl [metronidazole], Moxifloxacin, Pantoprazole sodium, and Penicillins   Social History:  The patient  reports that she has never smoked. She has never used smokeless tobacco. She reports that she does not drink alcohol or use drugs.   Family History:  The patient's family history includes Atrial fibrillation in an other family member; Cancer in her father; Colon polyps in her sister; Emphysema in her brother; Hypertension in her mother; Kidney disease in her sister; Other in her father; Pancreatic cancer  in her sister; Stroke in her mother.  ROS:  Please see the history of present illness.  All other systems are reviewed and otherwise negative.   PHYSICAL EXAM:  VS:  There were no vitals taken for this visit. BMI: There is no height or weight on file to calculate BMI. Well nourished, well developed, in no acute distress  HEENT: normocephalic, atraumatic  Neck: no JVD, carotid bruits or masses Cardiac:  *** RRR; no significant murmurs, no rubs, or gallops Lungs:  *** CTA b/l, no wheezing, rhonchi or rales  Abd: soft, nontender MS: no deformity or *** atrophy Ext: *** no edema  Skin: warm and dry, no rash Neuro:  No gross deficits appreciated Psych:  euthymic mood, full affect     EKG:  Not done today   07/01/13: LHC (done in [place]) 1. Normal coronaries 2. Normal LV size and function, EF 60%  06/30/13: TTE LVEF 123456 Diastolic dysfunction LA mildly dilated (76mm) No significant VHD described RV 25-60mmhg  Recent Labs: 02/16/2019: BUN 13; Creatinine, Ser 0.75; Hemoglobin 11.3; Platelets 256; Potassium 4.7; Sodium 130  No results found for requested labs within last 8760 hours.   CrCl cannot be calculated (Patient's most recent lab result is older than the maximum 21 days allowed.).   Wt Readings from Last 3 Encounters:  09/03/19 191 lb 3.2 oz (86.7 kg)  06/01/19 190 lb 3.2 oz (86.3 kg)  02/16/19 185 lb (83.9 kg)     Other studies reviewed: Additional studies/records reviewed today include: summarized above, and Dr. Olin Pia notes  ASSESSMENT AND PLAN:  1. Permanent AFib     CHA2DS2Vasc is at least 4, *** on warfarin     *** Follows with coumadin clinic  2. HTN     *** 3. Ortostatic hypotension     ***  4. Hx of HFpEF     *** Exam appears euvolemic     *** Weight   Disposition: ***   Current medicines are reviewed at length with the patient today.  The patient did not have any concerns regarding medicines.  Haywood Lasso, PA-C 10/05/2019  8:38 AM     [place] [address] [phone] (office)  [phone] (fax)

## 2019-10-07 ENCOUNTER — Ambulatory Visit: Payer: Medicare Other | Admitting: Physician Assistant

## 2019-10-08 NOTE — Progress Notes (Deleted)
Cardiology Office Note Date:  10/08/2019  Patient ID:  Stephanie Cortez, Stephanie Cortez 09/11/1933, MRN [id number] PCP:  [name], MD  Cardiologist:  Dr. Graceann Congress, MD (Gastroenterology) Brand Males, MD (Pulmonary Disease) Renato Shin, MD (Endocrinology) Erline Levine, MD (Neurosurgery) Weber Cooks, MD (Orthopedic Surgery) Rozetta Nunnery, MD (Otolaryngology)    Chief Complaint: *** advised by her PMD to have follow up ?    History of Present Illness: Stephanie Cortez is a 86 y.o. female with history of  Permanent AFib (historically failed multiple DCCV and AAD), bronchiectasis, COPD, HTN, CBP, anhalasia, IBS/colitis  She comes in today to be seen for Dr. Caryl Comes, last seen by him via tele-health visit June 2020.  She was doing well, following with pulmonary.  No changes were made.  Recommended annual visit.  Her last visit with pulmonary noted marked worsening in her CT, with associated worsening in her SOB.  Suspected hypersensitive pneumonitis.  Continued with HS oxygen, planned for bronch.  Dr. Caryl Comes addressed warfarin for this already.  *** who manages her warfarin? *** card symptoms? *** labs, lipids *** bleeding, warfarin *** h/o orthostatic sx *** HFpEF, volume   Past Medical History:  Diagnosis Date  . Anemia    - Hgb 9.7gm% on 07/13/2008 in [place] -  Hgg 129gm% wiht normal irone levsl and ferritin 10/27/2008 in [place] Recurrent otitis/sinusitis  . Anxiety    chronic BZ prn  . Atrial fibrillation (HCC)    chronic anticoag  . Bronchiectasis    >PFT 07/13/2008 in [place] 1.9L/76%, FVC 2.45L/74, Ratio 79, TLC 121%, DLCO 64%  AE BRonchiectasis - Dec 2010.New Rx:  outpatient - Feb 2011 - Rx outpatient  . CHF (congestive heart failure) ([place])   . COPD (chronic obstructive pulmonary disease) (HCC)    bronchiectasis  . Cricopharyngeal achalasia   . Depression   . Diverticulosis   . Dyslipidemia   . Eczema   . GERD with stricture   . Glaucoma   .  H. pylori infection   . Hypertension   . Hyponatremia    chronic, s/p endo eval 06/2012  . Hypothyroid   . IBS (irritable bowel syndrome)   . Lumbar disc disease   . Segmental colitis ([place])   . Vertigo     Past Surgical History:  Procedure Laterality Date  . BREAST SURGERY     br bx  . CARDIAC CATHETERIZATION    . CATARACT EXTRACTION     both  . COLONOSCOPY    . ESOPHAGOSCOPY W/ BOTOX INJECTION  12/11/2011   Procedure: ESOPHAGOSCOPY WITH BOTOX INJECTION;  Surgeon: Rozetta Nunnery, MD;  Location: [facility];  Service: ENT;  Laterality: N/A;  esophogoscopy with dilation, botox injection  . FOOT SURGERY  03/14/2011   gastroc slide-rt  . TOTAL ABDOMINAL HYSTERECTOMY      Current Outpatient Medications  Medication Sig Dispense Refill  . Biotin 5000 MCG TABS Take 5,000 mcg by mouth daily.    . bisoprolol (ZEBETA) 10 MG tablet Take 1 tablet by mouth daily. Please monitor your blood pressure and heart rate daily for a couple of weeks to make sure they remain stable. (Patient taking differently: Take 10 mg by mouth daily. Please monitor your blood pressure and heart rate daily for a couple of weeks to make sure they remain stable.) 90 tablet 1  . Cholecalciferol (VITAMIN D-3) 125 MCG (5000 UT) TABS Take 5,000 Units by mouth daily.     [name] Kitchen dextromethorphan-guaiFENesin (  MUCINEX DM) 30-600 MG 12hr tablet Take 1-2 tablets by mouth 2 (two) times daily as needed (for allegies/respiratory issues.).    [name] Kitchen diltiazem (CARDIZEM CD) 240 MG 24 hr capsule Take 1 capsule (240 mg total) by mouth daily. (Patient taking differently: Take 240 mg by mouth at bedtime. ) 90 capsule 1  . diphenhydrAMINE (BENADRYL) 25 MG tablet Take 25 mg by mouth every 6 (six) hours as needed for allergies.    [name] Kitchen FLUoxetine (PROZAC) 10 MG tablet Take 10 mg by mouth 2 (two) times daily as needed (anxiety).     . fluticasone (FLONASE) 50 MCG/ACT nasal spray Place 1 spray into both nostrils 2 (two) times daily as  needed (for nasal congestion).    . folic acid (FOLVITE) A999333 MCG tablet Take 1 mg by mouth 3 (three) times a week.     . furosemide (LASIX) 20 MG tablet Take 20 mg by mouth daily as needed for fluid.     [name] Kitchen levothyroxine (SYNTHROID, LEVOTHROID) 50 MCG tablet Take 1 tablet (50 mcg total) by mouth daily. (Patient taking differently: Take 50 mcg by mouth daily before breakfast. ) 90 tablet 3  . olmesartan (BENICAR) 40 MG tablet Take 40 mg by mouth daily.    [name] Kitchen omeprazole (PRILOSEC) 20 MG capsule TAKE 1 CAPSULE(20 MG) BY MOUTH DAILY (Patient taking differently: Take 20 mg by mouth daily. ) 90 capsule 4  . pravastatin (PRAVACHOL) 40 MG tablet Take 40 mg by mouth at bedtime.     . SYMBICORT 80-4.5 MCG/ACT inhaler INHALE 2 PUFFS INTO THE LUNGS TWICE DAILY (Patient taking differently: Inhale 2 puffs into the lungs in the morning and at bedtime. ) 10.2 g 3  . vitamin B-12 (CYANOCOBALAMIN) 1000 MCG tablet Take 1,000 mcg by mouth 3 (three) times a week.     . warfarin (COUMADIN) 2.5 MG tablet Take 1/2-1 tablet by mouth daily as directed by [facility]madin Clinic (Patient taking differently: Take 1.25-2.5 mg by mouth See admin instructions. Take 1 tablet (2.5 mg)  Tuesday Thursday & Saturday Sunday Take 0.5 tablet (1.25 mg) Monday. Wednesday & Friday) 90 tablet 1  . zinc gluconate 50 MG tablet Take 50 mg by mouth daily.     No current facility-administered medications for this visit.    Allergies:   Doxycycline, Flagyl [metronidazole], Moxifloxacin, Pantoprazole sodium, and Penicillins   Social History:  The patient  reports that she has never smoked. She has never used smokeless tobacco. She reports that she does not drink alcohol or use drugs.   Family History:  The patient's family history includes Atrial fibrillation in an other family member; Cancer in her father; Colon polyps in her sister; Emphysema in her brother; Hypertension in her mother; Kidney disease in her sister; Other in her father; Pancreatic cancer  in her sister; Stroke in her mother.  ROS:  Please see the history of present illness.  All other systems are reviewed and otherwise negative.   PHYSICAL EXAM:  VS:  There were no vitals taken for this visit. BMI: There is no height or weight on file to calculate BMI. Well nourished, well developed, in no acute distress  HEENT: normocephalic, atraumatic  Neck: no JVD, carotid bruits or masses Cardiac:  *** RRR; no significant murmurs, no rubs, or gallops Lungs:  *** CTA b/l, no wheezing, rhonchi or rales  Abd: soft, nontender MS: no deformity or *** atrophy Ext: *** no edema  Skin: warm and dry, no rash Neuro:  No gross deficits appreciated Psych:  euthymic mood, full affect     EKG:  Not done today   07/01/13: LHC (done in [place]) 1. Normal coronaries 2. Normal LV size and function, EF 60%  06/30/13: TTE LVEF 123456 Diastolic dysfunction LA mildly dilated (20mm) No significant VHD described RV 25-53mmhg  Recent Labs: 02/16/2019: BUN 13; Creatinine, Ser 0.75; Hemoglobin 11.3; Platelets 256; Potassium 4.7; Sodium 130  No results found for requested labs within last 8760 hours.   CrCl cannot be calculated (Patient's most recent lab result is older than the maximum 21 days allowed.).   Wt Readings from Last 3 Encounters:  09/03/19 191 lb 3.2 oz (86.7 kg)  06/01/19 190 lb 3.2 oz (86.3 kg)  02/16/19 185 lb (83.9 kg)     Other studies reviewed: Additional studies/records reviewed today include: summarized above, and Dr. Olin Pia notes  ASSESSMENT AND PLAN:  1. Permanent AFib     CHA2DS2Vasc is at least 4, *** on warfarin     *** Follows with coumadin clinic  2. HTN     *** 3. Ortostatic hypotension     ***  4. Hx of HFpEF     *** Exam appears euvolemic     *** Weight   Disposition: ***   Current medicines are reviewed at length with the patient today.  The patient did not have any concerns regarding medicines.  Haywood Lasso, PA-C 10/08/2019  7:35 PM     [address] [place] [id number] [phone] (office)  [phone] (fax)

## 2019-10-08 NOTE — Telephone Encounter (Signed)
MR please advise. Thanks! 

## 2019-10-09 ENCOUNTER — Ambulatory Visit: Payer: Medicare Other | Admitting: Physician Assistant

## 2019-10-09 ENCOUNTER — Telehealth: Payer: Self-pay | Admitting: Internal Medicine

## 2019-10-09 NOTE — Telephone Encounter (Signed)
I called and spoke with the patient in regards to the medication and the procedure. She has not been prescribed this medication she says. I can see that a bronch is scheduled but patient has not been notified of this and I did not want to give her any information without speaking with Dr. Chase Caller. Please advise.   I will forward to his nurse as Juluis Rainier.

## 2019-10-09 NOTE — Telephone Encounter (Signed)
Pt returning missed. Can be reached at [phone].

## 2019-10-11 NOTE — Telephone Encounter (Signed)
I can do it any day during the week  If 10/20/19 at [facility] based on scheduling. [facility] m at [facility] all day 7a-7p. She needs to stop her coumadin 5 days before. She should come NPO x 6h but can take other medications. I do not know what this new medication Pamasone is . Can she give more details on this please?  Thanks  MR

## 2019-10-11 NOTE — Telephone Encounter (Signed)
See other phone message. IF this is a steroid cream that is fine to use prior to bronch

## 2019-10-12 ENCOUNTER — Ambulatory Visit (INDEPENDENT_AMBULATORY_CARE_PROVIDER_SITE_OTHER): Payer: Medicare Other | Admitting: Internal Medicine

## 2019-10-12 ENCOUNTER — Other Ambulatory Visit: Payer: Self-pay

## 2019-10-12 ENCOUNTER — Encounter: Payer: Self-pay | Admitting: Internal Medicine

## 2019-10-12 VITALS — BP 148/82 | HR 83 | Ht 66.0 in | Wt 192.0 lb

## 2019-10-12 DIAGNOSIS — I509 Heart failure, unspecified: Secondary | ICD-10-CM

## 2019-10-12 DIAGNOSIS — I2510 Atherosclerotic heart disease of native coronary artery without angina pectoris: Secondary | ICD-10-CM | POA: Diagnosis not present

## 2019-10-12 DIAGNOSIS — I4821 Permanent atrial fibrillation: Secondary | ICD-10-CM | POA: Diagnosis not present

## 2019-10-12 NOTE — Patient Instructions (Addendum)
Medication Instructions:  Your physician recommends that you continue on your current medications as directed. Please refer to the Current Medication list given to you today.  Labwork: None ordered.  Testing/Procedures: None ordered.  Follow-Up: Your physician wants you to follow-up in: 12 months with Dr Gari Crown will receive a reminder letter in the mail two months in advance. If you don't receive a letter, please call our office to schedule the follow-up appointment.  Any Other Special Instructions Will Be Listed Below (If Applicable).  If you need a refill on your cardiac medications before your next appointment, please call your pharmacy.

## 2019-10-12 NOTE — Telephone Encounter (Signed)
ATC pt, no answer. Left message for pt to call back.  [facility] can you schedule bronch when pt returns call?

## 2019-10-12 NOTE — Progress Notes (Signed)
Patient Care Team: [name], MD as PCP - General (Family Medicine) Deboraha Sprang, MD as PCP - Cardiology (Cardiology) Deboraha Sprang, MD (Cardiology) Sable Feil, MD (Gastroenterology) Brand Males, MD (Pulmonary Disease) Renato Shin, MD (Endocrinology) Erline Levine, MD (Neurosurgery) Weber Cooks, MD (Inactive) (Orthopedic Surgery) Rozetta Nunnery, MD (Otolaryngology)   HPI  Stephanie Cortez is a 86 y.o. female Seen in followup for permanent atrial fibrillation having failed multiple cardioversions and antiarrhythmic drugs.  She is maintained on coumadin  She was admitted 12/14  [place]  because of chest pain elevated blood pressure and   atrial fibrillation with a rapid rate. She underwent a catheterization which was nonobstructive  Echocardiogram was also normal except for left atrial enlargement.    Sh has been diagnosed with ILD and is followed by Dr Chase Caller; increasing shortness of breath with exertion.  Denies chest discomfort with exertion.  Does have fleeting left-sided chest pains.  She is now wearing oxygen at sleep.  She is scheduled for a bronchoscopy.  She has been reluctant to get the Covid vaccine.   [employer] spaniel named Elyse Hsu; sister died fall 28-Sep-2017  Date Cr K Hgb  1/19 0.76 4.8 12.7   8/20 0.75 4.7 11.3     Past Medical History:  Diagnosis Date  . Anemia    - Hgb 9.7gm% on 07/13/2008 in [place] -  Hgg 129gm% wiht normal irone levsl and ferritin 10/27/2008 in [place] Recurrent otitis/sinusitis  . Anxiety    chronic BZ prn  . Atrial fibrillation (HCC)    chronic anticoag  . Bronchiectasis    >PFT 07/13/2008 in [place] 1.9L/76%, FVC 2.45L/74, Ratio 79, TLC 121%, DLCO 64%  AE BRonchiectasis - Dec 2010.New Rx:  outpatient - Feb 2011 - Rx outpatient  . CHF (congestive heart failure) ([place])   . COPD (chronic obstructive pulmonary disease) (HCC)    bronchiectasis  . Cricopharyngeal achalasia   . Depression   .  Diverticulosis   . Dyslipidemia   . Eczema   . GERD with stricture   . Glaucoma   . H. pylori infection   . Hypertension   . Hyponatremia    chronic, s/p endo eval 06/2012  . Hypothyroid   . IBS (irritable bowel syndrome)   . Lumbar disc disease   . Segmental colitis ([place])   . Vertigo     Past Surgical History:  Procedure Laterality Date  . BREAST SURGERY     br bx  . CARDIAC CATHETERIZATION    . CATARACT EXTRACTION     both  . COLONOSCOPY    . ESOPHAGOSCOPY W/ BOTOX INJECTION  12/11/2011   Procedure: ESOPHAGOSCOPY WITH BOTOX INJECTION;  Surgeon: Rozetta Nunnery, MD;  Location: [facility];  Service: ENT;  Laterality: N/A;  esophogoscopy with dilation, botox injection  . FOOT SURGERY  03/14/2011   gastroc slide-rt  . TOTAL ABDOMINAL HYSTERECTOMY      Current Outpatient Medications  Medication Sig Dispense Refill  . Biotin 5000 MCG TABS Take 5,000 mcg by mouth daily.    . bisoprolol (ZEBETA) 10 MG tablet Take 1 tablet by mouth daily. Please monitor your blood pressure and heart rate daily for a couple of weeks to make sure they remain stable. (Patient taking differently: Take 10 mg by mouth daily. Please monitor your blood pressure and heart rate daily for a couple of weeks to make sure they remain stable.) 90 tablet 1  . Cholecalciferol (  VITAMIN D-3) 125 MCG (5000 UT) TABS Take 5,000 Units by mouth daily.     [name] Kitchen dextromethorphan-guaiFENesin (MUCINEX DM) 30-600 MG 12hr tablet Take 1-2 tablets by mouth 2 (two) times daily as needed (for allegies/respiratory issues.).    [name] Kitchen diltiazem (CARDIZEM CD) 240 MG 24 hr capsule Take 1 capsule (240 mg total) by mouth daily. (Patient taking differently: Take 240 mg by mouth at bedtime. ) 90 capsule 1  . diphenhydrAMINE (BENADRYL) 25 MG tablet Take 25 mg by mouth every 6 (six) hours as needed for allergies.    [name] Kitchen FLUoxetine (PROZAC) 10 MG tablet Take 10 mg by mouth 2 (two) times daily as needed (anxiety).     . fluticasone  (FLONASE) 50 MCG/ACT nasal spray Place 1 spray into both nostrils 2 (two) times daily as needed (for nasal congestion).    . folic acid (FOLVITE) A999333 MCG tablet Take 1 mg by mouth 3 (three) times a week.     . furosemide (LASIX) 20 MG tablet Take 20 mg by mouth daily as needed for fluid.     [name] Kitchen levothyroxine (SYNTHROID, LEVOTHROID) 50 MCG tablet Take 1 tablet (50 mcg total) by mouth daily. (Patient taking differently: Take 50 mcg by mouth daily before breakfast. ) 90 tablet 3  . olmesartan (BENICAR) 40 MG tablet Take 40 mg by mouth daily.    [name] Kitchen omeprazole (PRILOSEC) 20 MG capsule TAKE 1 CAPSULE(20 MG) BY MOUTH DAILY (Patient taking differently: Take 20 mg by mouth daily. ) 90 capsule 4  . pravastatin (PRAVACHOL) 40 MG tablet Take 40 mg by mouth at bedtime.     . SYMBICORT 80-4.5 MCG/ACT inhaler INHALE 2 PUFFS INTO THE LUNGS TWICE DAILY (Patient taking differently: Inhale 2 puffs into the lungs in the morning and at bedtime. ) 10.2 g 3  . vitamin B-12 (CYANOCOBALAMIN) 1000 MCG tablet Take 1,000 mcg by mouth 3 (three) times a week.     . warfarin (COUMADIN) 2.5 MG tablet Take 1/2-1 tablet by mouth daily as directed by Coumadin Clinic (Patient taking differently: Take 1.25-2.5 mg by mouth See admin instructions. Take 1 tablet (2.5 mg)  Tuesday Thursday & Saturday Sunday Take 0.5 tablet (1.25 mg) Monday. Wednesday & Friday) 90 tablet 1  . zinc gluconate 50 MG tablet Take 50 mg by mouth daily.     No current facility-administered medications for this visit.    Allergies  Allergen Reactions  . Doxycycline     Rash on face and neck  . Flagyl [Metronidazole] Nausea And Vomiting  . Moxifloxacin Other (See Comments)     Weakness/fatigue  . Pantoprazole Sodium Rash  . Penicillins Rash    Has patient had a PCN reaction causing immediate rash, facial/tongue/throat swelling, SOB or lightheadedness with hypotension:unsure Has patient had a PCN reaction causing severe rash involving mucus membranes or skin  necrosis:unsure Has patient had a PCN reaction that required hospitalization:No Has patient had a PCN reaction occurring within the last 10 years:Yes Cannot recall exact reaction due to time lapse If all of the above answers are "NO", then may proceed with Cephalosporin use.     Review of Systems negative except from HPI and PMH  Physical Exam BP (!) 148/82   Pulse 83   Ht 5\' 6"  (1.676 m)   Wt 192 lb (87.1 kg)   SpO2 96%   BMI 30.99 kg/m  Well developed and nourished in no acute distress HENT normal Neck supple with JVP-flat Clear Irregularly irregular rate and rhythm, no murmurs or  gallops Abd-soft with active BS No Clubbing cyanosis edema Skin-warm and dry A & Oriented  Grossly normal sensory and motor function  ECG atrial fibrillation at 85 Intervals-/14/40 Right bundle branch block Septal MI Criteria new since 8/20.  Suspect lead placement error   Assessment and plan  Atrial fibrillation-permanent  HFpEF  Hypertension  Interstitial lung disease  Dyspnea on exertion  On Anticoagulation;  No bleeding issues   Blood pressure reasonably controlled based on the last office blood pressure 116  Encouraged her to get the Covid vaccine  Euvolemic continue current meds   We spent more than 50% of our >25 min visit in face to face counseling regarding the above      We spent more than 50% of our >25 min visit in face to face counseling regarding the above

## 2019-10-12 NOTE — Telephone Encounter (Signed)
Will close this message. See other message, MR response.

## 2019-10-12 NOTE — Telephone Encounter (Signed)
pcc's dont schedule bronch just enb and ebus you will have to call [phone] to schedule the [place]

## 2019-10-12 NOTE — Telephone Encounter (Signed)
Called and spoke with patient. Let them know their Bronch is scheduled for 10/20/19 at [facility] with Dr. Chase Caller at [place].  Patient was instructed to arrive at hospital at 0600. They were instructed to bring someone with them as they will not be able to drive home from procedure. Patient instructed not to have anything to eat or drink after midnight. Patient needs to hold their blood thinner Coumadin , 5 days days prior to procedure.   Patient's covid screening is scheduled at [facility]  for 10/17/19 at 1140.  Patient voiced understanding, nothing further needed  Routing to MR as [facility]

## 2019-10-12 NOTE — Telephone Encounter (Signed)
Patient would like to know when the Bronch Procedure is scheduled.  Patient phone number is [phone].

## 2019-10-15 ENCOUNTER — Encounter (INDEPENDENT_AMBULATORY_CARE_PROVIDER_SITE_OTHER): Payer: Self-pay | Admitting: Otolaryngology

## 2019-10-15 ENCOUNTER — Ambulatory Visit (INDEPENDENT_AMBULATORY_CARE_PROVIDER_SITE_OTHER): Payer: Medicare Other | Admitting: Otolaryngology

## 2019-10-15 ENCOUNTER — Other Ambulatory Visit: Payer: Self-pay

## 2019-10-15 VITALS — Temp 97.3°F

## 2019-10-15 DIAGNOSIS — J310 Chronic rhinitis: Secondary | ICD-10-CM

## 2019-10-15 DIAGNOSIS — I2510 Atherosclerotic heart disease of native coronary artery without angina pectoris: Secondary | ICD-10-CM | POA: Diagnosis not present

## 2019-10-15 NOTE — Progress Notes (Signed)
HPI: Stephanie Cortez is a 86 y.o. female who returns today for evaluation of headache and sinus complaints.  She has not been using nasal cannula O2 at night on a regular basis.  She is scheduled to undergo bronchoscopy next week.  She has been having some chronic scabbing and crusting in her nose and some "frontal sinus pressure". She has had no fever..  Past Medical History:  Diagnosis Date  . Anemia    - Hgb 9.7gm% on 07/13/2008 in [place] -  Hgg 129gm% wiht normal irone levsl and ferritin 10/27/2008 in [place] Recurrent otitis/sinusitis  . Anxiety    chronic BZ prn  . Atrial fibrillation (HCC)    chronic anticoag  . Bronchiectasis    >PFT 07/13/2008 in [place] 1.9L/76%, FVC 2.45L/74, Ratio 79, TLC 121%, DLCO 64%  AE BRonchiectasis - Dec 2010.New Rx:  outpatient - Feb 2011 - Rx outpatient  . CHF (congestive heart failure) ([place])   . COPD (chronic obstructive pulmonary disease) (HCC)    bronchiectasis  . Cricopharyngeal achalasia   . Depression   . Diverticulosis   . Dyslipidemia   . Eczema   . GERD with stricture   . Glaucoma   . H. pylori infection   . Hypertension   . Hyponatremia    chronic, s/p endo eval 06/2012  . Hypothyroid   . IBS (irritable bowel syndrome)   . Lumbar disc disease   . Segmental colitis ([place])   . Vertigo    Past Surgical History:  Procedure Laterality Date  . BREAST SURGERY     br bx  . CARDIAC CATHETERIZATION    . CATARACT EXTRACTION     both  . COLONOSCOPY    . ESOPHAGOSCOPY W/ BOTOX INJECTION  12/11/2011   Procedure: ESOPHAGOSCOPY WITH BOTOX INJECTION;  Surgeon: Rozetta Nunnery, MD;  Location: [facility];  Service: ENT;  Laterality: N/A;  esophogoscopy with dilation, botox injection  . FOOT SURGERY  03/14/2011   gastroc slide-rt  . TOTAL ABDOMINAL HYSTERECTOMY     Social History   Socioeconomic History  . Marital status: Married    Spouse name: Not on file  . Number of children: 2  . Years of education: Not on file  .  Highest education level: Not on file  Occupational History  . Occupation: retired Product manager: [employer]  Tobacco Use  . Smoking status: Never Smoker  . Smokeless tobacco: Never Used  Substance and Sexual Activity  . Alcohol use: No  . Drug use: No  . Sexual activity: Not on file  Other Topics Concern  . Not on file  Social History Narrative   2 children 4 grandchildren   Recently moved to [place] from [place]   Retired Pharmacist, hospital   Grew up in [place]. Moved to [place]. Moved to [place] Feb 2010 due to duaghther living in [place]. Son lives in [place], [place]. Husband works as Scientist, physiological in [place].   Social Determinants of Health   Financial Resource Strain:   . Difficulty of Paying Living Expenses:   Food Insecurity:   . Worried About Charity fundraiser in the Last Year:   . Arboriculturist in the Last Year:   Transportation Needs:   . Film/video editor (Medical):   [name] Kitchen Lack of Transportation (Non-Medical):   Physical Activity:   . Days of Exercise per Week:   . Minutes of Exercise per Session:   Stress:   .  Feeling of Stress :   Social Connections:   . Frequency of Communication with Friends and Family:   . Frequency of Social Gatherings with Friends and Family:   . Attends Religious Services:   . Active Member of Clubs or Organizations:   . Attends Archivist Meetings:   [name] Kitchen Marital Status:    Family History  Problem Relation Age of Onset  . Hypertension Mother   . Stroke Mother   . Emphysema Brother   . Other Father        miner's lung  . Cancer Father        Lung  . Colon polyps Sister   . Pancreatic cancer Sister   . Kidney disease Sister   . Atrial fibrillation Other        siblings   Allergies  Allergen Reactions  . Doxycycline     Rash on face and neck  . Flagyl [Metronidazole] Nausea And Vomiting  . Moxifloxacin Other (See Comments)     Weakness/fatigue  . Pantoprazole Sodium Rash  . Penicillins Rash     Has patient had a PCN reaction causing immediate rash, facial/tongue/throat swelling, SOB or lightheadedness with hypotension:unsure Has patient had a PCN reaction causing severe rash involving mucus membranes or skin necrosis:unsure Has patient had a PCN reaction that required hospitalization:No Has patient had a PCN reaction occurring within the last 10 years:Yes Cannot recall exact reaction due to time lapse If all of the above answers are "NO", then may proceed with Cephalosporin use.    Prior to Admission medications   Medication Sig Start Date End Date Taking? Authorizing Provider  Biotin 5000 MCG TABS Take 5,000 mcg by mouth daily.   Yes [provider]  bisoprolol (ZEBETA) 10 MG tablet Take 1 tablet by mouth daily. Please monitor your blood pressure and heart rate daily for a couple of weeks to make sure they remain stable. Patient taking differently: Take 10 mg by mouth daily. Please monitor your blood pressure and heart rate daily for a couple of weeks to make sure they remain stable. 07/20/19  Yes Deboraha Sprang, MD  Cholecalciferol (VITAMIN D-3) 125 MCG (5000 UT) TABS Take 5,000 Units by mouth daily.    Yes [provider]  dextromethorphan-guaiFENesin (MUCINEX DM) 30-600 MG 12hr tablet Take 1-2 tablets by mouth 2 (two) times daily as needed (for allegies/respiratory issues.).   Yes [provider]  diltiazem (CARDIZEM CD) 240 MG 24 hr capsule Take 1 capsule (240 mg total) by mouth daily. Patient taking differently: Take 240 mg by mouth at bedtime.  02/16/19  Yes Shirley Friar, PA-C  diphenhydrAMINE (BENADRYL) 25 MG tablet Take 25 mg by mouth every 6 (six) hours as needed for allergies.   Yes [provider]  FLUoxetine (PROZAC) 10 MG tablet Take 10 mg by mouth 2 (two) times daily as needed (anxiety).    Yes [provider]  fluticasone (FLONASE) 50 MCG/ACT nasal spray Place 1 spray into both nostrils 2 (two) times daily as needed  (for nasal congestion).   Yes [provider]  folic acid (FOLVITE) A999333 MCG tablet Take 1 mg by mouth 3 (three) times a week.    Yes [provider]  furosemide (LASIX) 20 MG tablet Take 20 mg by mouth daily as needed for fluid.    Yes [provider]  levothyroxine (SYNTHROID, LEVOTHROID) 50 MCG tablet Take 1 tablet (50 mcg total) by mouth daily. Patient taking differently: Take 50 mcg by  mouth daily before breakfast.  08/12/13  Yes Rowe Clack, MD  olmesartan (BENICAR) 40 MG tablet Take 40 mg by mouth daily.   Yes [provider]  omeprazole (PRILOSEC) 20 MG capsule TAKE 1 CAPSULE(20 MG) BY MOUTH DAILY Patient taking differently: Take 20 mg by mouth daily.  11/07/15  Yes Pyrtle, Lajuan Lines, MD  pravastatin (PRAVACHOL) 40 MG tablet Take 40 mg by mouth at bedtime.  10/03/16  Yes [provider]  SYMBICORT 80-4.5 MCG/ACT inhaler INHALE 2 PUFFS INTO THE LUNGS TWICE DAILY Patient taking differently: Inhale 2 puffs into the lungs in the morning and at bedtime.  06/17/19  Yes Brand Males, MD  vitamin B-12 (CYANOCOBALAMIN) 1000 MCG tablet Take 1,000 mcg by mouth 3 (three) times a week.    Yes [provider]  warfarin (COUMADIN) 2.5 MG tablet Take 1/2-1 tablet by mouth daily as directed by [facility] Patient taking differently: Take 1.25-2.5 mg by mouth See admin instructions. Take 1 tablet (2.5 mg)  Tuesday Thursday & Saturday Sunday Take 0.5 tablet (1.25 mg) Monday. Wednesday & Friday 01/27/16  Yes Deboraha Sprang, MD  zinc gluconate 50 MG tablet Take 50 mg by mouth daily.   Yes [provider]     Positive ROS: Otherwise negative  All other systems have been reviewed and were otherwise negative with the exception of those mentioned in the HPI and as above.  Physical Exam: Constitutional: Alert, well-appearing, no acute distress Ears: External ears without lesions or tenderness. Ear canals are clear bilaterally with intact,  clear TMs bilaterally. Nasal: External nose without lesions. Septum midline with some crusting scabbing along the septum on both sides most likely related to O2 use at night..  On nasal endoscopy the left middle meatus region is clear although slightly edematous.  No obvious mucopurulent discharge noted.  However on the right side on nasal endoscopy patient had a small amount of thick mucoid discharge within the right middle meatus.  No polyps noted. Oral: Lips and gums without lesions. Tongue and palate mucosa without lesions. Posterior oropharynx clear. Neck: No palpable adenopathy or masses Respiratory: Breathing comfortably  Skin: No facial/neck lesions or rash noted.  Nasal/sinus endoscopy  Date/Time: 10/15/2019 4:55 PM Performed by: Rozetta Nunnery, MD Authorized by: Rozetta Nunnery, MD   Consent:    Consent obtained:  Verbal   Consent given by:  Patient Procedure details:    Indications: sino-nasal symptoms     Medication:  Afrin   Instrument: flexible fiberoptic nasal endoscope   Comments:     On sinonasal endoscopy patient had some minimal thick mucus discharge from the right middle meatus.  She also has some slight scabbing along the septum most likely related to nasal cannula O2 use at night.    Assessment: Questionable sinus infection on nasal endoscopy Chronic rhinitis Chronic pulmonary disease.  Plan: Recommended regular use of Nasacort 2 sprays each nostril at night.  Suggested trying nasal gel spray to help with the dryness in her nose from use of nasal cannula O2. Prescribed Ceftin 250 mg twice daily for the 10 days as she may have a sinus infection noted with thick mucus discharge from the right middle meatus. She will follow-up as needed. Of note she had a CT scan of her sinuses performed in 2019 that showed mostly clear paranasal sinuses with small frontal sinuses.    Radene Journey, MD

## 2019-10-16 ENCOUNTER — Encounter (HOSPITAL_COMMUNITY): Payer: Self-pay | Admitting: Internal Medicine

## 2019-10-16 NOTE — Progress Notes (Signed)
Patient denies shortness of breath, fever, cough and chest pain.  PCP - Dr [name] Cardiologist - Dr Virl Axe Pulmonary - Dr Brand Males  Chest x-ray - n/a EKG - 10/12/19 Stress Test - 09/23/12 ECHO - 06/30/13 Cardiac Cath - 07/01/13  Blood Thinner Instructions:  Hold for 5 days prior to procedure per MD.  Last dose of Coumadin was on 10/15/19.  Anesthesia review:  Yes   STOP now taking any Aspirin (unless otherwise instructed by your surgeon), Aleve, Naproxen, Ibuprofen, Motrin, Advil, Goody's, BC's, all herbal medications, fish oil, and all vitamins.   Coronavirus Screening Covid test on 10/17/19 was negative.  Patient verbalized understanding of instructions that were given via phone.

## 2019-10-17 ENCOUNTER — Other Ambulatory Visit (HOSPITAL_COMMUNITY)
Admission: RE | Admit: 2019-10-17 | Discharge: 2019-10-17 | Disposition: A | Discharge status: Home / Self Care | Payer: Medicare Other | Source: Ambulatory Visit | Attending: Internal Medicine | Admitting: Internal Medicine

## 2019-10-17 DIAGNOSIS — Z20822 Contact with and (suspected) exposure to covid-19: Secondary | ICD-10-CM | POA: Diagnosis not present

## 2019-10-17 DIAGNOSIS — Z01812 Encounter for preprocedural laboratory examination: Secondary | ICD-10-CM | POA: Diagnosis not present

## 2019-10-17 LAB — SARS CORONAVIRUS 2 (TAT 6-24 HRS): SARS Coronavirus 2: NEGATIVE

## 2019-10-19 ENCOUNTER — Telehealth: Payer: Self-pay | Admitting: Internal Medicine

## 2019-10-19 ENCOUNTER — Telehealth: Payer: Self-pay | Admitting: Pulmonary Disease

## 2019-10-19 ENCOUNTER — Other Ambulatory Visit: Payer: Self-pay

## 2019-10-19 ENCOUNTER — Encounter (HOSPITAL_COMMUNITY): Payer: Self-pay | Admitting: Certified Registered Nurse Anesthetist

## 2019-10-19 ENCOUNTER — Encounter (HOSPITAL_COMMUNITY): Payer: Self-pay | Admitting: Internal Medicine

## 2019-10-19 NOTE — Telephone Encounter (Signed)
10/19/2019  What are Dr. Golden Pop concerns? This should be documented in the chart.   What was the discussion today with him over the phone? What are the topics that he wants addressed?   Per chart review seems like he has recently last seen the patient. PCP started the antibiotic.   What needs to be addressed prior to the scheduled bronch?  Routing back to triage and MR.  Wyn Quaker FNP

## 2019-10-19 NOTE — Telephone Encounter (Addendum)
I spoke with Dr. Chase Caller over the phone and he thinks pt should have a video visit with TP to go over patient concerns. He still thinks she can have the procedure done tomorrow. TP doesn't have any openings. Aaron Edelman if pt can set up a video visit, can I place her on the schedule to see you? Please advise.

## 2019-10-19 NOTE — Anesthesia Preprocedure Evaluation (Deleted)
Anesthesia Evaluation Anesthesia Physical Anesthesia Plan  ASA:   Anesthesia Plan:    Post-op Pain Management:    Induction:   PONV Risk Score and Plan:   Airway Management Planned:   Additional Equipment:   Intra-op Plan:   Post-operative Plan:   Informed Consent:   Plan Discussed with:   Anesthesia Plan Comments: (PAT note written 10/19/2019 by Myra Gianotti, PA-C. SAME DAY WORK-UP   )        Anesthesia Quick Evaluation

## 2019-10-19 NOTE — Telephone Encounter (Signed)
Pt called concerned about procedure tomorrow. Wanted to also mention that she has a rash on legs and left hips, been treated by Dr. [name] but has lasted 57yr., in addition to her concerns about medication she has been taking. Can be reached at [phone]

## 2019-10-19 NOTE — Telephone Encounter (Signed)
I called pt and she states she is feeling much better from sinus infection. She is still on Ceftin 250 and has a couple more days left to take. Does this effect her procedure tomorrow? The hospital needs orders as well. MR please advise.

## 2019-10-19 NOTE — Telephone Encounter (Signed)
Called by answering service that Ms. Richens called to say that she can't make her appointment for her Video Bronchoscopy procedure in the AM with Dr. Chase Caller.

## 2019-10-19 NOTE — Telephone Encounter (Signed)
I agree with triage as stated answers.  The bronchoscopy is invasive but this will be very helpful to further evaluate the patient's lungs.  Thank you for speaking with them.Wyn Quaker, FNP

## 2019-10-19 NOTE — Telephone Encounter (Signed)
The pt is having concerns about the bronch procedure. Dr. Chase Caller wants to make sure that she feels ok enough to have the procedure done. I spoke with Stephanie Cortez in admissions and she spoke with pt (Pt is very anxious) and she would like to proceed with the procedure. The video visit was to go over patients concerns and questions about procedure and to reassure that she seems well enough to move forward with it. She called back about the rash and that is when Dr. Chase Caller suggested a video visit with TP but she has no openings at this time.   On another note, I spoke with pt and she is not able to do a video visit anyway and feels comfortable doing the bronch procedure. I answered all her questions, the main question being, is the procedure invasive? I reassured her that MR is comfortable doing the procedure. The pt wants to get the bronch done because she is worried about her lungs. Dr. Chase Caller will just need to place the orders.   She thanked [place] for being a supportive staff and answering her questions.

## 2019-10-19 NOTE — Progress Notes (Signed)
Anesthesia Chart Review: SAME DAY WORK-UP   Case: [id number] Date/Time: 10/20/19 0730   Procedure: VIDEO BRONCHOSCOPY WITHOUT FLUORO and FLEX BRONCH WITH TRANSBRONCHIAL BIOPSY (N/A ) - NEED ANESTHESIA FOR LMA SUPPORT DUE TO 86 YEAR OLD   Anesthesia type: Monitor Anesthesia Care   Pre-op diagnosis: ILD   Location: [facility] / [facility]   Surgeons: Brand Males, MD      DISCUSSION: Patient is an 86 year old female scheduled for the above procedure.  History includes never smoker, COPD, ILD, bronchiectasis, home O2, cricopharyngeal achalasia, dyslipidemia, anemia, atrial fibrillation (multiple failed cardioversions and antiarrhythmic drugs; on warfarin), HTN, CHF, hypothyroidism, glaucoma, IBS, GERD, segmental colitis, vertigo, hyponatremia. Normal coronaries in 06/2013.   Dr. Dillard Essex did communicate with Dr. Caryl Comes regarding bronchoscopy plans and perioperative warfarin recommendations. Reported last dose of warfarin 10/15/19.   10/17/2019 presurgical COVID-19 test was negative.  She is for labs and anesthesia team evaluation on the day of surgery.   VS: Ht 5\' 6"  (1.676 m)   Wt 86.6 kg   LMP  (LMP Unknown)   BMI 30.83 kg/m   BP Readings from Last 3 Encounters:  10/12/19 (!) 148/82  09/03/19 116/74  06/01/19 128/70    PROVIDERS: PCP - Dr [name] Cardiologist - Dr Virl Axe Pulmonary - Dr Brand Males   LABS: For day of surgery. Cr 0.75, H/H 11.3/33.9 on 02/16/19.   PFTs 09/23/19: Results for PRICE, PHILLABAUM (MRN [id number]) as of 10/19/2019 14:34  Ref. Range 09/23/2019 13:08  FVC-Pre Latest Units: L 1.30  FVC-%Pred-Pre Latest Units: % 48  FEV1-Pre Latest Units: L 1.13  FEV1-%Pred-Pre Latest Units: % 56  Pre FEV1/FVC ratio Latest Units: % 86  FEV1FVC-%Pred-Pre Latest Units: % 118  FEF 25-75 Pre Latest Units: L/sec 1.40  FEF2575-%Pred-Pre Latest Units: % 106  FEV6-Pre Latest Units: L 1.30  FEV6-%Pred-Pre Latest Units: % 51  Pre FEV6/FVC Ratio Latest  Units: % 100  FEV6FVC-%Pred-Pre Latest Units: % 106  FVC-Post Latest Units: L 1.31  FVC-%Pred-Post Latest Units: % 48  FVC-%Change-Post Latest Units: % 0  FEV1-Post Latest Units: L 1.15  FEV1-%Pred-Post Latest Units: % 57  FEV1-%Change-Post Latest Units: % 1  Post FEV1/FVC ratio Latest Units: % 87  FEV1FVC-%Change-Post Latest Units: % 0  FEF 25-75 Post Latest Units: L/sec 1.52  FEF2575-%Pred-Post Latest Units: % 115  FEF2575-%Change-Post Latest Units: % 8  FEV6-Post Latest Units: L 1.31  FEV6-%Pred-Post Latest Units: % 51  FEV6-%Change-Post Latest Units: % 0  Post FEV6/FVC ratio Latest Units: % 100  FEV6FVC-%Pred-Post Latest Units: % 106     IMAGES: CT Chest (high resolution) 07/17/19: IMPRESSION: 1. The appearance of the lungs is compatible with interstitial lung disease, with a spectrum of findings considered most compatible with an alternative diagnosis to usual interstitial pneumonia (UIP) per current ATS guidelines. Specifically, imaging findings are most suggestive of progressive chronic hypersensitivity pneumonitis. 2. Aortic atherosclerosis, in addition to left main and right coronary artery disease. 3. Dilatation of the pulmonic trunk (3.6 cm in diameter), concerning for pulmonary arterial hypertension. 4. Cardiomegaly with biatrial dilatation (right greater than left). Aortic Atherosclerosis (ICD10-I70.0).   EKG: 10/12/19 (per Dr. Caryl Comes interpretation): ECG atrial fibrillation at 44 Intervals-/14/40 Right bundle branch block Septal MI Criteria new since 8/20.  Suspect lead placement error   CV: Cardiac cath 07/01/13 [facility]): Conclusions Normal coronary arteries Normal left ventricular size and function, EF 60% No mitral regurgitation, no aortic stenosis Normal left end-diastolic pressure   Echo [id number] [facility]):  Conclusions The left ventricular chamber size is normal Global left ventricular wall motion and contractility are within normal  limits The estimated EF is 60 to 65% Abnormal left ventricular diastolic filling is observed The left atrium is mildly dilated The right ventricular global systolic function is normal The right atrial cavity size is mildly dilated There is mild mitral regurgitation There is mild tricuspid regurgitation The RV PSP is estimated to be 25 to 30 mmHg There is trace pulmonic regurgitation  Past Medical History:  Diagnosis Date  . Anemia    - Hgb 9.7gm% on 07/13/2008 in [place] -  Hgg 129gm% wiht normal irone levsl and ferritin 10/27/2008 in [place] Recurrent otitis/sinusitis  . Anxiety    chronic BZ prn  . Atrial fibrillation (HCC)    chronic anticoag  . Bronchiectasis    >PFT 07/13/2008 in [place] 1.9L/76%, FVC 2.45L/74, Ratio 79, TLC 121%, DLCO 64%  AE BRonchiectasis - Dec 2010.New Rx:  outpatient - Feb 2011 - Rx outpatient  . CHF (congestive heart failure) ([place])   . COPD (chronic obstructive pulmonary disease) (HCC)    bronchiectasis  . Cricopharyngeal achalasia   . Depression   . Diverticulosis   . Dyslipidemia   . Eczema   . GERD with stricture   . Glaucoma   . H. pylori infection   . Hypertension   . Hyponatremia    chronic, s/p endo eval 06/2012  . Hypothyroid   . IBS (irritable bowel syndrome)   . Lumbar disc disease   . Neuropathy of both feet   . Segmental colitis ([place])   . Vertigo   . Wears glasses     Past Surgical History:  Procedure Laterality Date  . BREAST SURGERY     br bx  . CARDIAC CATHETERIZATION  07/01/2013  . CATARACT EXTRACTION     both  . COLONOSCOPY    . ESOPHAGOSCOPY W/ BOTOX INJECTION  12/11/2011   Procedure: ESOPHAGOSCOPY WITH BOTOX INJECTION;  Surgeon: Rozetta Nunnery, MD;  Location: [facility];  Service: ENT;  Laterality: N/A;  esophogoscopy with dilation, botox injection  . FOOT SURGERY  03/14/2011   gastroc slide-rt  . TOTAL ABDOMINAL HYSTERECTOMY    . WISDOM TOOTH EXTRACTION      MEDICATIONS: No current  facility-administered medications for this encounter.   . Biotin 5000 MCG TABS  . bisoprolol (ZEBETA) 10 MG tablet  . Cholecalciferol (VITAMIN D-3) 125 MCG (5000 UT) TABS  . dextromethorphan-guaiFENesin (MUCINEX DM) 30-600 MG 12hr tablet  . diltiazem (CARDIZEM CD) 240 MG 24 hr capsule  . diphenhydrAMINE (BENADRYL) 25 MG tablet  . FLUoxetine (PROZAC) 10 MG tablet  . fluticasone (FLONASE) 50 MCG/ACT nasal spray  . folic acid (FOLVITE) A999333 MCG tablet  . furosemide (LASIX) 20 MG tablet  . levothyroxine (SYNTHROID, LEVOTHROID) 50 MCG tablet  . olmesartan (BENICAR) 40 MG tablet  . omeprazole (PRILOSEC) 20 MG capsule  . pravastatin (PRAVACHOL) 40 MG tablet  . SYMBICORT 80-4.5 MCG/ACT inhaler  . vitamin B-12 (CYANOCOBALAMIN) 1000 MCG tablet  . warfarin (COUMADIN) 2.5 MG tablet  . zinc gluconate 50 MG tablet  . cefUROXime (CEFTIN) 250 MG tablet    Myra Gianotti, PA-C Surgical Short Stay/Anesthesiology [facility] Phone [phone] [facility] Phone [phone] 10/19/2019 2:42 PM

## 2019-10-20 ENCOUNTER — Ambulatory Visit (HOSPITAL_COMMUNITY)
Admission: RE | Admit: 2019-10-20 | Payer: Commercial Managed Care - PPO | Source: Home / Self Care | Admitting: Internal Medicine

## 2019-10-20 ENCOUNTER — Telehealth: Payer: Self-pay | Admitting: Internal Medicine

## 2019-10-20 HISTORY — DX: Presence of spectacles and contact lenses: Z97.3

## 2019-10-20 HISTORY — DX: Unspecified mononeuropathy of bilateral lower limbs: G57.93

## 2019-10-20 SURGERY — VIDEO BRONCHOSCOPY WITHOUT FLUORO
Anesthesia: Monitor Anesthesia Care

## 2019-10-20 NOTE — Telephone Encounter (Signed)
Spoke with the pt  She states cancelled her bronch today due to ongoing rash she has been having on her legs, arms, back- ithcy  She has been seeing her PCP for this- Dr Tamala Julian, and she is going to be referring her to a dermatologist  She wants to know if we can refer to her one as well so she can be seen as soon as possible

## 2019-10-20 NOTE — Telephone Encounter (Signed)
Triage  I have done the preop orders for bronch but Jolene Provost cancelled for bronchoscopy today. This is the 2nd cancellation  Plan  - pls give her face to face visit first avail with me next 2-3 weeks idealy  - Let me know when the followup date is - if she is very anxious I can just do lavage without biopsy but is best we do face to face

## 2019-10-20 NOTE — Telephone Encounter (Signed)
lmtcb for pt.   MR your first available is 5/20 for ILD day. Please advise. Thanks.   Will also forward to [facility], CMA, as message is closed.

## 2019-10-21 NOTE — Telephone Encounter (Signed)
LMTCB x 1 

## 2019-10-21 NOTE — Telephone Encounter (Signed)
I do not want to do prednisone for prednisone sake. That is why I wanted to do bronch to find out what ws wrong in lungs.   Plan  - 36  Min tele phone visit 8.45am early next week M, T, W o

## 2019-10-21 NOTE — Telephone Encounter (Signed)
There are ony few dermatologists in town and so there is a waiting list. You can try referring to [facility] - Dr Ronnald Ramp or Dr Warren Lacy or Dr Sarajane Jews or Dr Ubaldo Glassing or anyone. Not sure a 2nd referral will help  Also, plse set up 30 min visit to see me first avail next few weeks    [place] [id number]

## 2019-10-21 NOTE — Telephone Encounter (Signed)
Spoke with pt, she states she will come in but you don't have anything until the end of May. In the meanwhile she is requesting treatment with Prednisone for her lungs since she didn't do the bronchoscopy. She states she wants to get rid of the lung inflammation so she can breather better. MR please advise.

## 2019-10-22 NOTE — Telephone Encounter (Signed)
Will hold until the schedule is open.

## 2019-10-22 NOTE — Telephone Encounter (Signed)
Pt called back about rescheduling bronchoscopy and if she can get prednisone in the meantime. Please call to advise.   [phone] anytime today

## 2019-10-22 NOTE — Telephone Encounter (Signed)
Stephanie Cortez just opened up some new slots some afternoons early May 2021. Please get her in for face to face 30 min on any of those. OR atleast 15 min. Now thse are open, face to face better

## 2019-10-22 NOTE — Telephone Encounter (Signed)
Spoke with patient. She is aware of MR's response. I attempted to get her scheduled for a televisit early next week. She stated that 845am is not a good time for her. She wants to know if MR can work her into his schedule at a later time. I did advise her that MR's schedule is full.   MR, please advise. Thanks!

## 2019-10-22 NOTE — Telephone Encounter (Signed)
LMTCB x2 for pt 

## 2019-10-23 NOTE — Telephone Encounter (Signed)
Patient states Solomon had Prednisone 10 mg last filled 08/19/2017. Patient phone number is [phone].

## 2019-10-26 NOTE — Telephone Encounter (Signed)
Spoke with pt. She has been made aware again that MR does not want to put her back in prednisone at this time. Pt has been scheduled with MR 11/03/2019 at 1430. States that she has not been on prednisone since 2019.

## 2019-10-26 NOTE — Telephone Encounter (Signed)
Pt called back about this. Really wants to go on prednisone. Please call back. Pt has questions.  [phone]

## 2019-10-27 ENCOUNTER — Emergency Department (HOSPITAL_COMMUNITY): Payer: Medicare Other

## 2019-10-27 ENCOUNTER — Inpatient Hospital Stay (HOSPITAL_COMMUNITY)
Admission: EM | Admit: 2019-10-27 | Discharge: 2019-10-30 | DRG: 521 | Disposition: A | Discharge status: Rehabilitation Facility | Payer: Medicare Other | Attending: Internal Medicine | Admitting: Internal Medicine

## 2019-10-27 ENCOUNTER — Encounter (HOSPITAL_COMMUNITY): Payer: Self-pay

## 2019-10-27 ENCOUNTER — Other Ambulatory Visit: Payer: Self-pay

## 2019-10-27 DIAGNOSIS — I361 Nonrheumatic tricuspid (valve) insufficiency: Secondary | ICD-10-CM | POA: Diagnosis not present

## 2019-10-27 DIAGNOSIS — W010XXA Fall on same level from slipping, tripping and stumbling without subsequent striking against object, initial encounter: Secondary | ICD-10-CM | POA: Diagnosis present

## 2019-10-27 DIAGNOSIS — S72001A Fracture of unspecified part of neck of right femur, initial encounter for closed fracture: Secondary | ICD-10-CM

## 2019-10-27 DIAGNOSIS — E039 Hypothyroidism, unspecified: Secondary | ICD-10-CM | POA: Diagnosis not present

## 2019-10-27 DIAGNOSIS — I5032 Chronic diastolic (congestive) heart failure: Secondary | ICD-10-CM | POA: Diagnosis present

## 2019-10-27 DIAGNOSIS — Z9889 Other specified postprocedural states: Secondary | ICD-10-CM | POA: Diagnosis not present

## 2019-10-27 DIAGNOSIS — Z7901 Long term (current) use of anticoagulants: Secondary | ICD-10-CM | POA: Diagnosis not present

## 2019-10-27 DIAGNOSIS — S72011S Unspecified intracapsular fracture of right femur, sequela: Secondary | ICD-10-CM | POA: Diagnosis not present

## 2019-10-27 DIAGNOSIS — I110 Hypertensive heart disease with heart failure: Secondary | ICD-10-CM | POA: Diagnosis not present

## 2019-10-27 DIAGNOSIS — K5900 Constipation, unspecified: Secondary | ICD-10-CM | POA: Diagnosis not present

## 2019-10-27 DIAGNOSIS — E871 Hypo-osmolality and hyponatremia: Secondary | ICD-10-CM | POA: Diagnosis present

## 2019-10-27 DIAGNOSIS — R0902 Hypoxemia: Secondary | ICD-10-CM

## 2019-10-27 DIAGNOSIS — Z79899 Other long term (current) drug therapy: Secondary | ICD-10-CM

## 2019-10-27 DIAGNOSIS — R809 Proteinuria, unspecified: Secondary | ICD-10-CM | POA: Diagnosis present

## 2019-10-27 DIAGNOSIS — Z03818 Encounter for observation for suspected exposure to other biological agents ruled out: Secondary | ICD-10-CM | POA: Diagnosis not present

## 2019-10-27 DIAGNOSIS — S299XXA Unspecified injury of thorax, initial encounter: Secondary | ICD-10-CM | POA: Diagnosis not present

## 2019-10-27 DIAGNOSIS — Z7951 Long term (current) use of inhaled steroids: Secondary | ICD-10-CM

## 2019-10-27 DIAGNOSIS — I509 Heart failure, unspecified: Secondary | ICD-10-CM | POA: Diagnosis not present

## 2019-10-27 DIAGNOSIS — D72829 Elevated white blood cell count, unspecified: Secondary | ICD-10-CM | POA: Diagnosis not present

## 2019-10-27 DIAGNOSIS — S72001D Fracture of unspecified part of neck of right femur, subsequent encounter for closed fracture with routine healing: Secondary | ICD-10-CM | POA: Diagnosis not present

## 2019-10-27 DIAGNOSIS — J9621 Acute and chronic respiratory failure with hypoxia: Secondary | ICD-10-CM | POA: Diagnosis not present

## 2019-10-27 DIAGNOSIS — Z881 Allergy status to other antibiotic agents status: Secondary | ICD-10-CM | POA: Diagnosis not present

## 2019-10-27 DIAGNOSIS — R519 Headache, unspecified: Secondary | ICD-10-CM | POA: Diagnosis not present

## 2019-10-27 DIAGNOSIS — Z888 Allergy status to other drugs, medicaments and biological substances status: Secondary | ICD-10-CM | POA: Diagnosis not present

## 2019-10-27 DIAGNOSIS — Z9981 Dependence on supplemental oxygen: Secondary | ICD-10-CM | POA: Diagnosis not present

## 2019-10-27 DIAGNOSIS — M25551 Pain in right hip: Secondary | ICD-10-CM | POA: Diagnosis not present

## 2019-10-27 DIAGNOSIS — R7989 Other specified abnormal findings of blood chemistry: Secondary | ICD-10-CM

## 2019-10-27 DIAGNOSIS — M978XXA Periprosthetic fracture around other internal prosthetic joint, initial encounter: Secondary | ICD-10-CM | POA: Diagnosis not present

## 2019-10-27 DIAGNOSIS — J9811 Atelectasis: Secondary | ICD-10-CM | POA: Diagnosis not present

## 2019-10-27 DIAGNOSIS — I4821 Permanent atrial fibrillation: Secondary | ICD-10-CM | POA: Diagnosis not present

## 2019-10-27 DIAGNOSIS — Z96641 Presence of right artificial hip joint: Secondary | ICD-10-CM | POA: Diagnosis not present

## 2019-10-27 DIAGNOSIS — G8918 Other acute postprocedural pain: Secondary | ICD-10-CM | POA: Diagnosis not present

## 2019-10-27 DIAGNOSIS — F419 Anxiety disorder, unspecified: Secondary | ICD-10-CM | POA: Diagnosis present

## 2019-10-27 DIAGNOSIS — W010XXD Fall on same level from slipping, tripping and stumbling without subsequent striking against object, subsequent encounter: Secondary | ICD-10-CM | POA: Diagnosis present

## 2019-10-27 DIAGNOSIS — S72011A Unspecified intracapsular fracture of right femur, initial encounter for closed fracture: Secondary | ICD-10-CM | POA: Diagnosis not present

## 2019-10-27 DIAGNOSIS — K220 Achalasia of cardia: Secondary | ICD-10-CM | POA: Diagnosis present

## 2019-10-27 DIAGNOSIS — E46 Unspecified protein-calorie malnutrition: Secondary | ICD-10-CM | POA: Diagnosis not present

## 2019-10-27 DIAGNOSIS — I340 Nonrheumatic mitral (valve) insufficiency: Secondary | ICD-10-CM | POA: Diagnosis not present

## 2019-10-27 DIAGNOSIS — Z8249 Family history of ischemic heart disease and other diseases of the circulatory system: Secondary | ICD-10-CM

## 2019-10-27 DIAGNOSIS — E785 Hyperlipidemia, unspecified: Secondary | ICD-10-CM | POA: Diagnosis present

## 2019-10-27 DIAGNOSIS — Z841 Family history of disorders of kidney and ureter: Secondary | ICD-10-CM | POA: Diagnosis not present

## 2019-10-27 DIAGNOSIS — I4891 Unspecified atrial fibrillation: Secondary | ICD-10-CM | POA: Diagnosis present

## 2019-10-27 DIAGNOSIS — D62 Acute posthemorrhagic anemia: Secondary | ICD-10-CM | POA: Diagnosis not present

## 2019-10-27 DIAGNOSIS — Z20822 Contact with and (suspected) exposure to covid-19: Secondary | ICD-10-CM | POA: Diagnosis present

## 2019-10-27 DIAGNOSIS — D72828 Other elevated white blood cell count: Secondary | ICD-10-CM | POA: Diagnosis not present

## 2019-10-27 DIAGNOSIS — K219 Gastro-esophageal reflux disease without esophagitis: Secondary | ICD-10-CM | POA: Diagnosis present

## 2019-10-27 DIAGNOSIS — M9701XA Periprosthetic fracture around internal prosthetic right hip joint, initial encounter: Secondary | ICD-10-CM | POA: Diagnosis not present

## 2019-10-27 DIAGNOSIS — Z825 Family history of asthma and other chronic lower respiratory diseases: Secondary | ICD-10-CM

## 2019-10-27 DIAGNOSIS — F329 Major depressive disorder, single episode, unspecified: Secondary | ICD-10-CM | POA: Diagnosis present

## 2019-10-27 DIAGNOSIS — R0989 Other specified symptoms and signs involving the circulatory and respiratory systems: Secondary | ICD-10-CM | POA: Diagnosis not present

## 2019-10-27 DIAGNOSIS — I482 Chronic atrial fibrillation, unspecified: Secondary | ICD-10-CM | POA: Diagnosis not present

## 2019-10-27 DIAGNOSIS — Z823 Family history of stroke: Secondary | ICD-10-CM

## 2019-10-27 DIAGNOSIS — J449 Chronic obstructive pulmonary disease, unspecified: Secondary | ICD-10-CM | POA: Diagnosis present

## 2019-10-27 DIAGNOSIS — J479 Bronchiectasis, uncomplicated: Secondary | ICD-10-CM | POA: Diagnosis present

## 2019-10-27 DIAGNOSIS — J849 Interstitial pulmonary disease, unspecified: Secondary | ICD-10-CM | POA: Diagnosis present

## 2019-10-27 DIAGNOSIS — I5030 Unspecified diastolic (congestive) heart failure: Secondary | ICD-10-CM

## 2019-10-27 DIAGNOSIS — R52 Pain, unspecified: Secondary | ICD-10-CM | POA: Diagnosis not present

## 2019-10-27 DIAGNOSIS — I10 Essential (primary) hypertension: Secondary | ICD-10-CM | POA: Diagnosis not present

## 2019-10-27 DIAGNOSIS — Z880 Allergy status to penicillin: Secondary | ICD-10-CM

## 2019-10-27 DIAGNOSIS — R55 Syncope and collapse: Secondary | ICD-10-CM | POA: Diagnosis not present

## 2019-10-27 DIAGNOSIS — I959 Hypotension, unspecified: Secondary | ICD-10-CM | POA: Diagnosis present

## 2019-10-27 DIAGNOSIS — Z79890 Hormone replacement therapy (postmenopausal): Secondary | ICD-10-CM

## 2019-10-27 DIAGNOSIS — S0990XA Unspecified injury of head, initial encounter: Secondary | ICD-10-CM | POA: Diagnosis not present

## 2019-10-27 DIAGNOSIS — M25572 Pain in left ankle and joints of left foot: Secondary | ICD-10-CM | POA: Diagnosis not present

## 2019-10-27 DIAGNOSIS — Z96649 Presence of unspecified artificial hip joint: Secondary | ICD-10-CM | POA: Diagnosis not present

## 2019-10-27 DIAGNOSIS — Z471 Aftercare following joint replacement surgery: Secondary | ICD-10-CM | POA: Diagnosis not present

## 2019-10-27 DIAGNOSIS — K5903 Drug induced constipation: Secondary | ICD-10-CM | POA: Diagnosis not present

## 2019-10-27 DIAGNOSIS — R7401 Elevation of levels of liver transaminase levels: Secondary | ICD-10-CM | POA: Diagnosis present

## 2019-10-27 DIAGNOSIS — E8809 Other disorders of plasma-protein metabolism, not elsewhere classified: Secondary | ICD-10-CM | POA: Diagnosis present

## 2019-10-27 HISTORY — DX: Fracture of unspecified part of neck of right femur, initial encounter for closed fracture: S72.001A

## 2019-10-27 LAB — CBC WITH DIFFERENTIAL/PLATELET
Abs Immature Granulocytes: 0.09 10*3/uL — ABNORMAL HIGH (ref 0.00–0.07)
Basophils Absolute: 0.1 10*3/uL (ref 0.0–0.1)
Basophils Relative: 0 %
Eosinophils Absolute: 0 10*3/uL (ref 0.0–0.5)
Eosinophils Relative: 0 %
HCT: 41.7 % (ref 36.0–46.0)
Hemoglobin: 13.1 g/dL (ref 12.0–15.0)
Immature Granulocytes: 1 %
Lymphocytes Relative: 4 %
Lymphs Abs: 0.5 10*3/uL — ABNORMAL LOW (ref 0.7–4.0)
MCH: 30.4 pg (ref 26.0–34.0)
MCHC: 31.4 g/dL (ref 30.0–36.0)
MCV: 96.8 fL (ref 80.0–100.0)
Monocytes Absolute: 0.7 10*3/uL (ref 0.1–1.0)
Monocytes Relative: 5 %
Neutro Abs: 14.2 10*3/uL — ABNORMAL HIGH (ref 1.7–7.7)
Neutrophils Relative %: 90 %
Platelets: 253 10*3/uL (ref 150–400)
RBC: 4.31 MIL/uL (ref 3.87–5.11)
RDW: 13.6 % (ref 11.5–15.5)
WBC: 15.6 10*3/uL — ABNORMAL HIGH (ref 4.0–10.5)
nRBC: 0 % (ref 0.0–0.2)

## 2019-10-27 LAB — COMPREHENSIVE METABOLIC PANEL
ALT: 18 U/L (ref 0–44)
AST: 30 U/L (ref 15–41)
Albumin: 3.4 g/dL — ABNORMAL LOW (ref 3.5–5.0)
Alkaline Phosphatase: 100 U/L (ref 38–126)
Anion gap: 12 (ref 5–15)
BUN: 12 mg/dL (ref 8–23)
CO2: 21 mmol/L — ABNORMAL LOW (ref 22–32)
Calcium: 8.8 mg/dL — ABNORMAL LOW (ref 8.9–10.3)
Chloride: 99 mmol/L (ref 98–111)
Creatinine, Ser: 0.54 mg/dL (ref 0.44–1.00)
GFR calc Af Amer: >60 mL/min (ref >60)
GFR calc non Af Amer: >60 mL/min (ref >60)
Glucose, Bld: 146 mg/dL — ABNORMAL HIGH (ref 70–99)
Potassium: 4.4 mmol/L (ref 3.5–5.1)
Sodium: 132 mmol/L — ABNORMAL LOW (ref 135–145)
Total Bilirubin: 1 mg/dL (ref 0.3–1.2)
Total Protein: 6.6 g/dL (ref 6.5–8.1)

## 2019-10-27 LAB — PROTIME-INR
INR: 1.5 — ABNORMAL HIGH (ref 0.8–1.2)
Prothrombin Time: 17.9 seconds — ABNORMAL HIGH (ref 11.4–15.2)

## 2019-10-27 LAB — RESPIRATORY PANEL BY RT PCR (FLU A&B, COVID)
Influenza A by PCR: NEGATIVE
Influenza B by PCR: NEGATIVE
SARS Coronavirus 2 by RT PCR: NEGATIVE

## 2019-10-27 MED ORDER — LEVOTHYROXINE SODIUM 50 MCG PO TABS
50.0000 ug | ORAL_TABLET | Freq: Every day | ORAL | Status: DC
  Administered 2019-10-28 – 2019-10-30 (×3): 50 ug via ORAL
  Filled 2019-10-27 (×3): qty 1

## 2019-10-27 MED ORDER — IRBESARTAN 150 MG PO TABS
300.0000 mg | ORAL_TABLET | Freq: Every day | ORAL | Status: DC
  Administered 2019-10-28 – 2019-10-30 (×3): 300 mg via ORAL
  Filled 2019-10-27 (×3): qty 2

## 2019-10-27 MED ORDER — MORPHINE SULFATE (PF) 2 MG/ML IV SOLN
2.0000 mg | INTRAVENOUS | Status: DC | PRN
  Administered 2019-10-27 – 2019-10-28 (×2): 2 mg via INTRAVENOUS
  Filled 2019-10-27 (×2): qty 1

## 2019-10-27 MED ORDER — MOMETASONE FURO-FORMOTEROL FUM 100-5 MCG/ACT IN AERO
2.0000 | INHALATION_SPRAY | Freq: Two times a day (BID) | RESPIRATORY_TRACT | Status: DC
  Administered 2019-10-27 – 2019-10-30 (×6): 2 via RESPIRATORY_TRACT
  Filled 2019-10-27: qty 8.8

## 2019-10-27 MED ORDER — MUPIROCIN 2 % EX OINT
1.0000 "application " | TOPICAL_OINTMENT | Freq: Two times a day (BID) | CUTANEOUS | Status: DC
  Administered 2019-10-27 – 2019-10-29 (×4): 1 via NASAL
  Filled 2019-10-27: qty 22

## 2019-10-27 MED ORDER — FLUTICASONE PROPIONATE 50 MCG/ACT NA SUSP
1.0000 | Freq: Two times a day (BID) | NASAL | Status: DC | PRN
  Filled 2019-10-27: qty 16

## 2019-10-27 MED ORDER — FUROSEMIDE 20 MG PO TABS: 20.0000 mg | ORAL_TABLET | Freq: Every day | ORAL | Status: DC | PRN

## 2019-10-27 MED ORDER — ONDANSETRON HCL 4 MG PO TABS: 4.0000 mg | ORAL_TABLET | Freq: Four times a day (QID) | ORAL | Status: DC | PRN

## 2019-10-27 MED ORDER — DILTIAZEM HCL ER COATED BEADS 240 MG PO CP24
240.0000 mg | ORAL_CAPSULE | Freq: Every day | ORAL | Status: DC
  Administered 2019-10-27 – 2019-10-28 (×2): 240 mg via ORAL
  Filled 2019-10-27 (×2): qty 1
  Filled 2019-10-27: qty 2

## 2019-10-27 MED ORDER — PRAVASTATIN SODIUM 20 MG PO TABS
40.0000 mg | ORAL_TABLET | Freq: Every day | ORAL | Status: DC
  Administered 2019-10-27 – 2019-10-29 (×3): 40 mg via ORAL
  Filled 2019-10-27 (×3): qty 2

## 2019-10-27 MED ORDER — BISOPROLOL FUMARATE 5 MG PO TABS
10.0000 mg | ORAL_TABLET | Freq: Every day | ORAL | Status: DC
  Administered 2019-10-28 – 2019-10-30 (×3): 10 mg via ORAL
  Filled 2019-10-27 (×3): qty 2

## 2019-10-27 MED ORDER — ACETAMINOPHEN 650 MG RE SUPP: 650.0000 mg | Freq: Four times a day (QID) | RECTAL | Status: DC | PRN

## 2019-10-27 MED ORDER — ACETAMINOPHEN 325 MG PO TABS: 650.0000 mg | ORAL_TABLET | Freq: Four times a day (QID) | ORAL | Status: DC | PRN

## 2019-10-27 MED ORDER — LACTATED RINGERS IV SOLN: INTRAVENOUS | Status: DC

## 2019-10-27 MED ORDER — ONDANSETRON HCL 4 MG/2ML IJ SOLN: 4.0000 mg | Freq: Four times a day (QID) | INTRAMUSCULAR | Status: DC | PRN

## 2019-10-27 MED ORDER — FENTANYL CITRATE (PF) 100 MCG/2ML IJ SOLN
50.0000 ug | Freq: Once | INTRAMUSCULAR | Status: AC
  Administered 2019-10-27: 50 ug via INTRAVENOUS
  Filled 2019-10-27: qty 2

## 2019-10-27 MED ORDER — OXYCODONE HCL 5 MG PO TABS
5.0000 mg | ORAL_TABLET | ORAL | Status: DC | PRN
  Administered 2019-10-27: 5 mg via ORAL
  Filled 2019-10-27: qty 1

## 2019-10-27 MED ORDER — FLUOXETINE HCL 10 MG PO CAPS
10.0000 mg | ORAL_CAPSULE | Freq: Two times a day (BID) | ORAL | Status: DC | PRN
  Filled 2019-10-27: qty 1

## 2019-10-27 NOTE — Plan of Care (Signed)
Plan of care for admission day discussed with patient and family at bedside (son in law: Drew).   All questions answered at this time.    Awaiting ortho consult.      S[place], Therapist, sports

## 2019-10-27 NOTE — Progress Notes (Signed)
RN called Dr. Lorin Mercy to assess when he would be rounding.   Dr. Lorin Mercy reported that the patient would be having surgery tomorrow later afternoon and he plans to see her in the morning.   I will inform patient and family of OrthoPod's plan.    SWhittemore, Therapist, sports

## 2019-10-27 NOTE — ED Triage Notes (Addendum)
Pt arrives [facility] from home. Pt reports a fall last night at [place], Pt reports her family helped her off the floor. Pt reports right hip pain. Pt unable to bear weight.EMS administered 23mcg Fentanyl IV. No obvious deformity noted. Pt endorses head injury but denies LOC. Pt is on blood thinners.

## 2019-10-27 NOTE — ED Notes (Signed)
Tech taking patient up 

## 2019-10-27 NOTE — ED Provider Notes (Signed)
[facility] DEPT Provider Note   CSN: [id number] Arrival date & time: 10/27/19  1053     History Chief Complaint  Patient presents with  . Fall  . Hip Pain    Stephanie Cortez is a 86 y.o. female.  HPI HPI Comments: Stephanie Cortez is a 86 y.o. female with history of atrial fibrillation anticoagulated on Coumadin who presents to the Emergency Department complaining of a fall that occurred last night.  Patient states a small dog stepped in front of her and she tripped over the dog resulting in a fall on her right side. She was on the floor for about 20 minutes before she called her son-in-law who came and helped her from the floor. Patient states she felt a hard surface and landed on her right hip and struck her head.  She reports a very mild pain along the right parietal region of her scalp.  Patient reports exquisite pain in her right posterior hip that worsens with any movement of the right lower extremity.  This morning her son-in-law urged patient to come to the emergency department for evaluation and she contacted EMS who transported her here.  She was given 50 mcg of fentanyl in route.  She states her pain is about a 7 or 8 out of 10 at the moment.  Her pain worsens to a 10 out of 10 with any movement of the right lower extremity.  She denies any syncope last night.  No chest pain or shortness of breath.  No numbness, tingling, weakness.    Past Medical History:  Diagnosis Date  . Anemia    - Hgb 9.7gm% on 07/13/2008 in [place] -  Hgg 129gm% wiht normal irone levsl and ferritin 10/27/2008 in [place] Recurrent otitis/sinusitis  . Anxiety    chronic BZ prn  . Atrial fibrillation (HCC)    chronic anticoag  . Bronchiectasis    >PFT 07/13/2008 in [place] 1.9L/76%, FVC 2.45L/74, Ratio 79, TLC 121%, DLCO 64%  AE BRonchiectasis - Dec 2010.New Rx:  outpatient - Feb 2011 - Rx outpatient  . CHF (congestive heart failure) ([place])   . COPD (chronic obstructive  pulmonary disease) (HCC)    bronchiectasis  . Cricopharyngeal achalasia   . Depression   . Diverticulosis   . Dyslipidemia   . Eczema   . GERD with stricture   . Glaucoma   . H. pylori infection   . Hypertension   . Hyponatremia    chronic, s/p endo eval 06/2012  . Hypothyroid   . IBS (irritable bowel syndrome)   . Lumbar disc disease   . Neuropathy of both feet   . 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 Vertigo   . Wears glasses     Patient Active Problem List   Diagnosis Date Noted  . Hoarseness 02/19/2018  . Sensorineural hearing loss (SNHL) of both ears 02/19/2018  . Tinnitus, bilateral 02/19/2018  . Cough 09/06/2017  . Cough variant asthma 08/19/2017  . Acute bronchitis 02/20/2016  . Pulmonary air trapping 01/04/2016  . Encounter for preoperative pulmonary examination 01/04/2016  . NSIP (nonspecific interstitial pneumonia) ([place]) 09/26/2015  . Chronic sinusitis 09/26/2015  . ILD (interstitial lung disease) ([place]) 07/12/2014  . Upper airway cough syndrome 09/23/2013  . Encounter for therapeutic drug monitoring 08/11/2013  . Congestive heart failure ([place]) 07/08/2013  . Coronary artery calcification seen on CAT scan 03/15/2013  . Nodule of left lung 03/15/2013  . Orthostatic lightheadedness 02/09/2013  . Situational  mixed anxiety and depressive disorder   . Hyponatremia 04/03/2012  . Diverticulitis 01/01/2012  . GERD with stricture 01/01/2012  . Hypothyroid 11/23/2010  . Edema 10/04/2010  . Bronchiectasis ([place])   . HYPERTENSION, BENIGN 10/22/2008  . A-fib [facility])     Past Surgical History:  Procedure Laterality Date  . BREAST SURGERY     br bx  . CARDIAC CATHETERIZATION  07/01/2013  . CATARACT EXTRACTION     both  . COLONOSCOPY    . ESOPHAGOSCOPY W/ BOTOX INJECTION  12/11/2011   Procedure: ESOPHAGOSCOPY WITH BOTOX INJECTION;  Surgeon: Rozetta Nunnery, MD;  Location: [facility];  Service: ENT;  Laterality: N/A;  esophogoscopy with dilation, botox  injection  . FOOT SURGERY  03/14/2011   gastroc slide-rt  . TOTAL ABDOMINAL HYSTERECTOMY    . WISDOM TOOTH EXTRACTION       OB History   No obstetric history on file.     Family History  Problem Relation Age of Onset  . Hypertension Mother   . Stroke Mother   . Emphysema Brother   . Other Father        miner's lung  . Cancer Father        Lung  . Colon polyps Sister   . Pancreatic cancer Sister   . Kidney disease Sister   . Atrial fibrillation Other        siblings    Social History   Tobacco Use  . Smoking status: Never Smoker  . Smokeless tobacco: Never Used  Substance Use Topics  . Alcohol use: No  . Drug use: No    Home Medications Prior to Admission medications   Medication Sig Start Date End Date Taking? Authorizing Provider  Biotin 5000 MCG TABS Take 5,000 mcg by mouth daily.    [provider]  bisoprolol (ZEBETA) 10 MG tablet Take 1 tablet by mouth daily. Please monitor your blood pressure and heart rate daily for a couple of weeks to make sure they remain stable. Patient taking differently: Take 10 mg by mouth daily. Please monitor your blood pressure and heart rate daily for a couple of weeks to make sure they remain stable. 07/20/19   Deboraha Sprang, MD  cefUROXime (CEFTIN) 250 MG tablet Take 250 mg by mouth 2 (two) times daily. 10/16/19   [provider]  Cholecalciferol (VITAMIN D-3) 125 MCG (5000 UT) TABS Take 5,000 Units by mouth daily.     [provider]  dextromethorphan-guaiFENesin (MUCINEX DM) 30-600 MG 12hr tablet Take 1-2 tablets by mouth 2 (two) times daily as needed (for allegies/respiratory issues.).    [provider]  diltiazem (CARDIZEM CD) 240 MG 24 hr capsule Take 1 capsule (240 mg total) by mouth daily. Patient taking differently: Take 240 mg by mouth at bedtime.  02/16/19   Shirley Friar, PA-C  diphenhydrAMINE (BENADRYL) 25 MG tablet Take 25 mg by mouth every 6 (six) hours as needed for  allergies.    [provider]  FLUoxetine (PROZAC) 10 MG tablet Take 10 mg by mouth 2 (two) times daily as needed (anxiety).     [provider]  fluticasone (FLONASE) 50 MCG/ACT nasal spray Place 1 spray into both nostrils 2 (two) times daily as needed (for nasal congestion).    [provider]  folic acid (FOLVITE) A999333 MCG tablet Take 1 mg by mouth 3 (three) times a week.     [provider]  furosemide (LASIX) 20 MG tablet Take  20 mg by mouth daily as needed for fluid.     [provider]  levothyroxine (SYNTHROID, LEVOTHROID) 50 MCG tablet Take 1 tablet (50 mcg total) by mouth daily. Patient taking differently: Take 50 mcg by mouth daily before breakfast.  08/12/13   Rowe Clack, MD  olmesartan (BENICAR) 40 MG tablet Take 40 mg by mouth daily.    [provider]  omeprazole (PRILOSEC) 20 MG capsule TAKE 1 CAPSULE(20 MG) BY MOUTH DAILY Patient taking differently: Take 20 mg by mouth daily.  11/07/15   Pyrtle, Lajuan Lines, MD  pravastatin (PRAVACHOL) 40 MG tablet Take 40 mg by mouth at bedtime.  10/03/16   [provider]  SYMBICORT 80-4.5 MCG/ACT inhaler INHALE 2 PUFFS INTO THE LUNGS TWICE DAILY Patient taking differently: Inhale 2 puffs into the lungs in the morning and at bedtime.  06/17/19   Brand Males, MD  vitamin B-12 (CYANOCOBALAMIN) 1000 MCG tablet Take 1,000 mcg by mouth 3 (three) times a week.     [provider]  warfarin (COUMADIN) 2.5 MG tablet Take 1/2-1 tablet by mouth daily as directed by [facility] Patient taking differently: Take 1.25-2.5 mg by mouth See admin instructions. Take 1 tablet (2.5 mg)  Tuesday Thursday & Saturday Sunday Take 0.5 tablet (1.25 mg) Monday. Wednesday & Friday 01/27/16   Deboraha Sprang, MD  zinc gluconate 50 MG tablet Take 50 mg by mouth daily.    [provider]    Allergies    Doxycycline, Flagyl [metronidazole], Moxifloxacin, Pantoprazole sodium, and  Penicillins  Review of Systems   Review of Systems  All other systems reviewed and are negative. Ten systems reviewed and are negative for acute change, except as noted in the HPI.   Physical Exam Updated Vital Signs Resp 18   LMP  (LMP Unknown)   SpO2 95%   Physical Exam Vitals and nursing note reviewed.  Constitutional:      General: She is in acute distress.     Appearance: Normal appearance. She is normal weight. She is not ill-appearing, toxic-appearing or diaphoretic.     Comments: Pleasant well-developed elderly female lying in semi-Fowlers position and speaking in complete sentences.  She is a poor historian but does answer questions clearly when asked.  HENT:     Head: Normocephalic.     Comments: Ecchymosis noted to the right parietal temporal region of the scalp.  Minimal TTP overlying the site.  No visible signs of bleeding or lacerations.    Right Ear: External ear normal.     Left Ear: External ear normal.     Nose: Nose normal.     Mouth/Throat:     Mouth: Mucous membranes are moist.     Pharynx: Oropharynx is clear. No oropharyngeal exudate or posterior oropharyngeal erythema.  Eyes:     General: No scleral icterus.       Right eye: No discharge.        Left eye: No discharge.     Extraocular Movements: Extraocular movements intact.     Pupils: Pupils are equal, round, and reactive to light.  Cardiovascular:     Rate and Rhythm: Normal rate and regular rhythm.     Pulses: Normal pulses.     Heart sounds: Normal heart sounds. No murmur. No friction rub. No gallop.   Pulmonary:     Effort: Pulmonary effort is normal. No respiratory distress.     Breath sounds: Normal breath sounds. No stridor. No wheezing, rhonchi or  rales.  Chest:     Chest wall: No tenderness.  Abdominal:     General: Abdomen is flat.     Palpations: Abdomen is soft.     Tenderness: There is no abdominal tenderness.  Musculoskeletal:        General: Tenderness present. Normal range of  motion.     Cervical back: Normal range of motion and neck supple. No tenderness.     Comments: Moderate TTP noted to the right posterior hip.  Unable to assess range of motion of the right lower extremity due to pain.  Exquisite pain with any motion of the right lower extremity.  Patient keeps right lower extremity in a flexed position due to pain.  Skin:    General: Skin is warm and dry.     Capillary Refill: Capillary refill takes 2 to 3 seconds.  Neurological:     General: No focal deficit present.     Mental Status: She is alert and oriented to person, place, and time.     Comments: Patient is oriented to person, place, time.  Strength 5 out of 5 in the bilateral upper extremities.  Distal sensation intact.  Patient able to move fingers and toes spontaneously and without difficulty.  Unable to assess strength in the right lower extremity due to pain.  Unable to assess gait due to pain.  2+ pedal pulses noted bilaterally.  Psychiatric:        Mood and Affect: Mood normal.        Behavior: Behavior normal.    ED Results / Procedures / Treatments   Labs (all labs ordered are listed, but only abnormal results are displayed) Labs Reviewed  COMPREHENSIVE METABOLIC PANEL - Abnormal; Notable for the following components:      Result Value   Sodium 132 (*)    CO2 21 (*)    Glucose, Bld 146 (*)    Calcium 8.8 (*)    Albumin 3.4 (*)    All other components within normal limits  CBC WITH DIFFERENTIAL/PLATELET - Abnormal; Notable for the following components:   WBC 15.6 (*)    Neutro Abs 14.2 (*)    Lymphs Abs 0.5 (*)    Abs Immature Granulocytes 0.09 (*)    All other components within normal limits  URINE CULTURE  RESPIRATORY PANEL BY RT PCR (FLU A&B, COVID)  URINALYSIS, ROUTINE W REFLEX MICROSCOPIC   EKG EKG Interpretation  Date/Time:  Tuesday October 27 2019 11:29:00 EDT Ventricular Rate:  97 PR Interval:    QRS Duration: 141 QT Interval:  399 QTC Calculation: 507 R  Axis:   62 Text Interpretation: Atrial fibrillation Right bundle branch block No acute changes No significant change since last tracing Confirmed by Varney Biles [id number]) on 10/27/2019 11:44:14 AM  Radiology DG Pelvis 1-2 Views  Result Date: 10/27/2019 CLINICAL DATA:  Fall, right hip pain EXAM: PELVIS - 1-2 VIEW COMPARISON:  None. FINDINGS: Acute, mildly impacted subcapital fracture of the proximal right femur. No significant displacement or angulation evident on AP view. Moderate osteoarthritis of the hips, right worse than left. No dislocation. SI joints are intact mild-to-moderate osteoarthritis. No SI joint or pubic symphysis diastasis. IMPRESSION: Acute, mildly impacted subcapital fracture of the proximal right femur. With Electronically Signed   By: Davina Poke D.O.   On: 10/27/2019 12:15   CT Head Wo Contrast  Result Date: 10/27/2019 CLINICAL DATA:  86 year old female status post fall last night at 1930 hours. Pain. EXAM: CT HEAD WITHOUT CONTRAST  TECHNIQUE: Contiguous axial images were obtained from the base of the skull through the vertex without intravenous contrast. COMPARISON:  Brain MRI 04/07/2012.  Head CT 01/20/2009. FINDINGS: Brain: Cerebral volume has not significantly changed and remains within normal limits. No midline shift, ventriculomegaly, mass effect, evidence of mass lesion, intracranial hemorrhage or evidence of cortically based acute infarction. Chronic perivascular space at the right inferior basal ganglia (normal variant). Normal for age gray-white matter differentiation throughout the brain. Vascular: Calcified atherosclerosis at the skull base. Skull: No acute osseous abnormality identified. Sinuses/Orbits: Visualized paranasal sinuses and mastoids are stable and well pneumatized. Other: No acute orbit or scalp soft tissue finding. IMPRESSION: No acute intracranial abnormality or acute traumatic injury identified. Normal for age non contrast CT appearance of the brain.  Electronically Signed   By: Genevie [name] M.D.   On: 10/27/2019 11:55   DG Chest Port 1 View  Result Date: 10/27/2019 CLINICAL DATA:  Fracture. Status post fall. Patient reports weakness. EXAM: PORTABLE CHEST 1 VIEW COMPARISON:  Chest CT 07/17/2019, chest radiograph 08/07/2017 FINDINGS: Unchanged cardiomegaly. Aortic atherosclerosis. Redemonstrated findings consistent with known chronic interstitial lung disease most notable within the upper to mid lung fields. New from prior exams, there is superimposed more confluent airspace consolidation within the left upper lobe and left lung base which is nonspecific, but suspicious for superimposed pneumonia. A small left pleural effusion may be present. No evidence of pneumothorax. Chronic fracture deformity of the proximal left humerus. No acute bony abnormality is identified. IMPRESSION: Redemonstrated findings consistent with known chronic interstitial lung disease, most notably within the upper to mid lungs. New from prior exams, there is superimposed confluent airspace consolidation within the left upper lobe and left lung base which is nonspecific, but suspicious for superimposed pneumonia. Radiographic follow-up is recommended. A small left pleural effusion may be present. Unchanged cardiomegaly. Aortic Atherosclerosis (ICD10-I70.0). Electronically Signed   By: Kellie Simmering DO   On: 10/27/2019 13:15   Procedures Procedures (including critical care time)  Medications Ordered in ED Medications  fentaNYL (SUBLIMAZE) injection 50 mcg (50 mcg Intravenous Given 10/27/19 1224)    ED Course  I have reviewed the triage vital signs and the nursing notes.  Pertinent labs & imaging results that were available during my care of the patient were reviewed by me and considered in my medical decision making (see chart for details).    MDM Rules/Calculators/A&P                      11:25 AM patient is an 86 year old female that presents today after a fall last night.   She has bruising on her right scalp as well as significant right posterior hip pain.  Patient has a history of atrial fibrillation and is anticoagulated on Coumadin.  Due to the nature of her fall and her being chronically anticoagulated will obtain CT of the head without contrast.  We will additionally obtain x-rays of her pelvis as well as basic labs.  We will closely monitor and reassess.  12:54 PM patient's pain was elevated once again and she was given an additional 50 mcg of fentanyl.  She states her current pain is about a 5/10.  X-ray of the pelvis shows an acute mildly displaced subcapital fracture of the proximal right femur.  The CT of her head is negative for acute abnormalities.  I discussed this with patient and her son-in-law.  They understand that I am going to discuss with the on-call orthopedist and  she will likely be admitted.  1:41 PM spoke to Dr. Rodell Perna who is the on-call orthopedic surgeon. He would like to have patient admitted. He states that she can eat today and needs to be n.p.o. at midnight. He will do surgery tomorrow. Thinks this will likely be a hemiarthroplasty or total hip. I will discuss with hospitalist team for admission.   Final Clinical Impression(s) / ED Diagnoses Final diagnoses:  Closed subcapital fracture of right femur, initial encounter [facility])    Rx / [place] Orders ED Discharge Orders    None       Rayna Sexton, PA-C 10/27/19 [place], MD 10/28/19 [phone]

## 2019-10-27 NOTE — Progress Notes (Addendum)
Discussed with patient's daughter and and patient's son-in-law by phone her diagnosis of femoral neck hip fracture and recommendations for hemiarthroplasty and scheduling for surgery tomorrow Wednesday afternoon.  Patient uses oxygen , has weak arm strength and they report patient's overweight.  We will see patient in a.m. to reviewed planned surgery.  Urgent order set for surgery placed.  Patient does not need to sign permit until I see her in the morning and discussed and explained to her in detail.  My cell phone [phone].

## 2019-10-27 NOTE — H&P (Signed)
History and Physical    Stephanie Cortez [id number] DOB: 08/03/1933 DOA: 10/27/2019  PCP: [name], MD   Patient coming from: Home  I have personally briefly reviewed patient's old medical records in [place]  Chief Complaint: Home  HPI: Stephanie Cortez is a 86 y.o. female with medical history significant of permanent A. fib on Coumadin, chronic diastolic heart failure, hypertension, probable ILD, hypothyroidism, hyperlipidemia, depression presented with a fall that occurred last night.  Patient states that her dog stepped in front of her and she tripped over the dog resulting in a fall on her right side.  She landed on her right hip and struck her head as well.  Subsequently, she has been having severe pain in her right hip, sharp in nature, 7-8 out of 10 intensity, aggravated by any little movement, relieved by nothing.  Patient denies loss of consciousness, syncope, chest pain, shortness of breath, fever, diarrhea, dysuria, seizures, blurred vision.  ED Course: CT of the head was negative for acute abnormalities.  X-ray of the pelvis showed an acute mildly displaced subcapital fracture of the proximal right femur.  Orthopedics/Dr. Lorin Mercy was consulted by ED provider and he stated that patient will have surgery tomorrow. Hospitalist service was called to evaluate the patient.  Review of Systems: As per HPI otherwise all other systems were reviewed and are negative.   Past Medical History:  Diagnosis Date  . Anemia    - Hgb 9.7gm% on 07/13/2008 in [place] -  Hgg 129gm% wiht normal irone levsl and ferritin 10/27/2008 in [place] Recurrent otitis/sinusitis  . Anxiety    chronic BZ prn  . Atrial fibrillation (HCC)    chronic anticoag  . Bronchiectasis    >PFT 07/13/2008 in [place] 1.9L/76%, FVC 2.45L/74, Ratio 79, TLC 121%, DLCO 64%  AE BRonchiectasis - Dec 2010.New Rx:  outpatient - Feb 2011 - Rx outpatient  . CHF (congestive heart failure) ([place])   . COPD (chronic  obstructive pulmonary disease) (HCC)    bronchiectasis  . Cricopharyngeal achalasia   . Depression   . Diverticulosis   . Dyslipidemia   . Eczema   . GERD with stricture   . Glaucoma   . H. pylori infection   . Hypertension   . Hyponatremia    chronic, s/p endo eval 06/2012  . Hypothyroid   . IBS (irritable bowel syndrome)   . Lumbar disc disease   . Neuropathy of both feet   . Segmental colitis ([place])   . Vertigo   . Wears glasses     Past Surgical History:  Procedure Laterality Date  . BREAST SURGERY     br bx  . CARDIAC CATHETERIZATION  07/01/2013  . CATARACT EXTRACTION     both  . COLONOSCOPY    . ESOPHAGOSCOPY W/ BOTOX INJECTION  12/11/2011   Procedure: ESOPHAGOSCOPY WITH BOTOX INJECTION;  Surgeon: Rozetta Nunnery, MD;  Location: [facility];  Service: ENT;  Laterality: N/A;  esophogoscopy with dilation, botox injection  . FOOT SURGERY  03/14/2011   gastroc slide-rt  . TOTAL ABDOMINAL HYSTERECTOMY    . WISDOM TOOTH EXTRACTION     Social history  reports that she has never smoked. She has never used smokeless tobacco. She reports that she does not drink alcohol or use drugs.  Allergies  Allergen Reactions  . Doxycycline     Rash on face and neck  . Flagyl [Metronidazole] Nausea And Vomiting  . Moxifloxacin Other (See Comments)  Weakness/fatigue  . Pantoprazole Sodium Rash  . Penicillins Rash    Has patient had a PCN reaction causing immediate rash, facial/tongue/throat swelling, SOB or lightheadedness with hypotension:unsure Has patient had a PCN reaction causing severe rash involving mucus membranes or skin necrosis:unsure Has patient had a PCN reaction that required hospitalization:No Has patient had a PCN reaction occurring within the last 10 years:Yes Cannot recall exact reaction due to time lapse If all of the above answers are "NO", then may proceed with Cephalosporin use.     Family History  Problem Relation Age of Onset  .  Hypertension Mother   . Stroke Mother   . Emphysema Brother   . Other Father        miner's lung  . Cancer Father        Lung  . Colon polyps Sister   . Pancreatic cancer Sister   . Kidney disease Sister   . Atrial fibrillation Other        siblings    Prior to Admission medications   Medication Sig Start Date End Date Taking? Authorizing Provider  Biotin 5000 MCG TABS Take 5,000 mcg by mouth daily.   Yes [provider]  bisoprolol (ZEBETA) 10 MG tablet Take 1 tablet by mouth daily. Please monitor your blood pressure and heart rate daily for a couple of weeks to make sure they remain stable. Patient taking differently: Take 10 mg by mouth daily. Please monitor your blood pressure and heart rate daily for a couple of weeks to make sure they remain stable. 07/20/19  Yes Deboraha Sprang, MD  cefUROXime (CEFTIN) 250 MG tablet Take 250 mg by mouth 2 (two) times daily. 10/16/19  Yes [provider]  Cholecalciferol (VITAMIN D-3) 125 MCG (5000 UT) TABS Take 5,000 Units by mouth daily.    Yes [provider]  dextromethorphan-guaiFENesin (MUCINEX DM) 30-600 MG 12hr tablet Take 1-2 tablets by mouth 2 (two) times daily as needed (for allegies/respiratory issues.).   Yes [provider]  diltiazem (CARDIZEM CD) 240 MG 24 hr capsule Take 1 capsule (240 mg total) by mouth daily. Patient taking differently: Take 240 mg by mouth at bedtime.  02/16/19  Yes Shirley Friar, PA-C  diphenhydrAMINE (BENADRYL) 25 MG tablet Take 25 mg by mouth every 6 (six) hours as needed for allergies.   Yes [provider]  FLUoxetine (PROZAC) 10 MG tablet Take 10 mg by mouth 2 (two) times daily as needed (anxiety).    Yes [provider]  fluticasone (FLONASE) 50 MCG/ACT nasal spray Place 1 spray into both nostrils 2 (two) times daily as needed (for nasal congestion).   Yes [provider]  folic acid (FOLVITE) A999333 MCG tablet Take 1 mg by mouth 3 (three)  times a week. MWF   Yes [provider]  furosemide (LASIX) 20 MG tablet Take 20 mg by mouth daily as needed for fluid.    Yes [provider]  levothyroxine (SYNTHROID, LEVOTHROID) 50 MCG tablet Take 1 tablet (50 mcg total) by mouth daily. Patient taking differently: Take 50 mcg by mouth daily before breakfast.  08/12/13  Yes Rowe Clack, MD  olmesartan (BENICAR) 40 MG tablet Take 40 mg by mouth daily.   Yes [provider]  omeprazole (PRILOSEC) 20 MG capsule TAKE 1 CAPSULE(20 MG) BY MOUTH DAILY Patient taking differently: Take 20 mg by mouth daily.  11/07/15  Yes Pyrtle, Lajuan Lines, MD  pravastatin (PRAVACHOL) 40 MG tablet Take 40 mg by  mouth at bedtime.  10/03/16  Yes [provider]  SYMBICORT 80-4.5 MCG/ACT inhaler INHALE 2 PUFFS INTO THE LUNGS TWICE DAILY Patient taking differently: Inhale 2 puffs into the lungs in the morning and at bedtime.  06/17/19  Yes Brand Males, MD  vitamin B-12 (CYANOCOBALAMIN) 1000 MCG tablet Take 1,000 mcg by mouth 3 (three) times a week. MWF   Yes [provider]  warfarin (COUMADIN) 2.5 MG tablet Take 1/2-1 tablet by mouth daily as directed by [facility] Patient taking differently: Take 1.25-2.5 mg by mouth See admin instructions. Take 1 tablet (2.5 mg)  Tuesday Thursday & Saturday Sunday Take 0.5 tablet (1.25 mg) Monday. Wednesday & Friday 01/27/16  Yes Deboraha Sprang, MD  zinc gluconate 50 MG tablet Take 50 mg by mouth daily.   Yes [provider]    Physical Exam: Vitals:   10/27/19 1114 10/27/19 1215 10/27/19 1315 10/27/19 1322  BP: (!) 158/73 (!) 178/89 132/79 132/79  Pulse: 97 92 82 97  Resp: 18 (!) 30 (!) 31 (!) 21  Temp: 99.1 F (37.3 C)     TempSrc: Oral     SpO2: 94% 98% 94% 96%    Constitutional: NAD, calm, comfortable Vitals:   10/27/19 1114 10/27/19 1215 10/27/19 1315 10/27/19 1322  BP: (!) 158/73 (!) 178/89 132/79 132/79  Pulse: 97 92 82 97  Resp: 18 (!) 30 (!) 31 (!) 21   Temp: 99.1 F (37.3 C)     TempSrc: Oral     SpO2: 94% 98% 94% 96%   Eyes: PERRL, lids and conjunctivae normal ENMT: Mucous membranes are moist. Posterior pharynx clear of any exudate or lesions. Neck: normal, supple, no masses, no thyromegaly Respiratory: bilateral decreased breath sounds at bases, no wheezing; some scattered crackles.  Intermittently tachypneic. No accessory muscle use.  Cardiovascular: S1 S2 positive, rate controlled. No extremity edema. 2+ pedal pulses.  Abdomen: no tenderness, no masses palpated. No hepatosplenomegaly. Bowel sounds positive.  Musculoskeletal: no clubbing / cyanosis.  Right hip tenderness present.   Skin: no rashes, lesions, ulcers. No induration Neurologic: CN 2-12 grossly intact. Moving extremities. No focal neurologic deficits.  Psychiatric: Normal judgment and insight. Alert and oriented x 3. Normal mood.    Labs on Admission: I have personally reviewed following labs and imaging studies  CBC: Recent Labs  Lab 10/27/19 1200  WBC 15.6*  NEUTROABS 14.2*  HGB 13.1  HCT 41.7  MCV 96.8  PLT 123456   Basic Metabolic Panel: Recent Labs  Lab 10/27/19 1200  NA 132*  K 4.4  CL 99  CO2 21*  GLUCOSE 146*  BUN 12  CREATININE 0.54  CALCIUM 8.8*   GFR: Estimated Creatinine Clearance: 58 mL/min (by C-G formula based on SCr of 0.54 mg/dL). Liver Function Tests: Recent Labs  Lab 10/27/19 1200  AST 30  ALT 18  ALKPHOS 100  BILITOT 1.0  PROT 6.6  ALBUMIN 3.4*   No results for input(s): LIPASE, AMYLASE in the last 168 hours. No results for input(s): AMMONIA in the last 168 hours. Coagulation Profile: No results for input(s): INR, PROTIME in the last 168 hours. Cardiac Enzymes: No results for input(s): CKTOTAL, CKMB, CKMBINDEX, TROPONINI in the last 168 hours. BNP (last 3 results) No results for input(s): PROBNP in the last 8760 hours. HbA1C: No results for input(s): HGBA1C in the last 72 hours. CBG: No results for input(s): GLUCAP  in the last 168 hours. Lipid Profile: No results for input(s): CHOL, HDL, LDLCALC, TRIG, CHOLHDL,  LDLDIRECT in the last 72 hours. Thyroid Function Tests: No results for input(s): TSH, T4TOTAL, FREET4, T3FREE, THYROIDAB in the last 72 hours. Anemia Panel: No results for input(s): VITAMINB12, FOLATE, FERRITIN, TIBC, IRON, RETICCTPCT in the last 72 hours. Urine analysis:    Component Value Date/Time   COLORURINE YELLOW 04/07/2012 [place] 04/07/2012 1443   LABSPEC 1.006 04/07/2012 1443   PHURINE 7.0 04/07/2012 1443   GLUCOSEU NEGATIVE 04/07/2012 1443   HGBUR TRACE (A) 04/07/2012 1443   BILIRUBINUR NEGATIVE 04/07/2012 1443   BILIRUBINUR Neg 02/06/2012 1022   KETONESUR NEGATIVE 04/07/2012 1443   PROTEINUR NEGATIVE 04/07/2012 1443   UROBILINOGEN 0.2 04/07/2012 1443   NITRITE NEGATIVE 04/07/2012 1443   LEUKOCYTESUR NEGATIVE 04/07/2012 1443    Radiological Exams on Admission: DG Pelvis 1-2 Views  Result Date: 10/27/2019 CLINICAL DATA:  Fall, right hip pain EXAM: PELVIS - 1-2 VIEW COMPARISON:  None. FINDINGS: Acute, mildly impacted subcapital fracture of the proximal right femur. No significant displacement or angulation evident on AP view. Moderate osteoarthritis of the hips, right worse than left. No dislocation. SI joints are intact mild-to-moderate osteoarthritis. No SI joint or pubic symphysis diastasis. IMPRESSION: Acute, mildly impacted subcapital fracture of the proximal right femur. With Electronically Signed   By: Davina Poke D.O.   On: 10/27/2019 12:15   CT Head Wo Contrast  Result Date: 10/27/2019 CLINICAL DATA:  86 year old female status post fall last night at 1930 hours. Pain. EXAM: CT HEAD WITHOUT CONTRAST TECHNIQUE: Contiguous axial images were obtained from the base of the skull through the vertex without intravenous contrast. COMPARISON:  Brain MRI 04/07/2012.  Head CT 01/20/2009. FINDINGS: Brain: Cerebral volume has not significantly changed and  remains within normal limits. No midline shift, ventriculomegaly, mass effect, evidence of mass lesion, intracranial hemorrhage or evidence of cortically based acute infarction. Chronic perivascular space at the right inferior basal ganglia (normal variant). Normal for age gray-white matter differentiation throughout the brain. Vascular: Calcified atherosclerosis at the skull base. Skull: No acute osseous abnormality identified. Sinuses/Orbits: Visualized paranasal sinuses and mastoids are stable and well pneumatized. Other: No acute orbit or scalp soft tissue finding. IMPRESSION: No acute intracranial abnormality or acute traumatic injury identified. Normal for age non contrast CT appearance of the brain. Electronically Signed   By: Genevie [name] M.D.   On: 10/27/2019 11:55   DG Chest Port 1 View  Result Date: 10/27/2019 CLINICAL DATA:  Fracture. Status post fall. Patient reports weakness. EXAM: PORTABLE CHEST 1 VIEW COMPARISON:  Chest CT 07/17/2019, chest radiograph 08/07/2017 FINDINGS: Unchanged cardiomegaly. Aortic atherosclerosis. Redemonstrated findings consistent with known chronic interstitial lung disease most notable within the upper to mid lung fields. New from prior exams, there is superimposed more confluent airspace consolidation within the left upper lobe and left lung base which is nonspecific, but suspicious for superimposed pneumonia. A small left pleural effusion may be present. No evidence of pneumothorax. Chronic fracture deformity of the proximal left humerus. No acute bony abnormality is identified. IMPRESSION: Redemonstrated findings consistent with known chronic interstitial lung disease, most notably within the upper to mid lungs. New from prior exams, there is superimposed confluent airspace consolidation within the left upper lobe and left lung base which is nonspecific, but suspicious for superimposed pneumonia. Radiographic follow-up is recommended. A small left pleural effusion may be  present. Unchanged cardiomegaly. Aortic Atherosclerosis (ICD10-I70.0). Electronically Signed   By: Kellie Simmering DO   On: 10/27/2019 13:15    EKG: Independently reviewed.  A. fib.  No ST elevation or depression.  Assessment/Plan  Subcapital fracture of the right proximal femur Mechanical fall -Orthopedics/Dr. Lorin Mercy has been consulted by ED provider.  Orthopedics will probably plan for surgical intervention in a.m. -Keep n.p.o. past midnight.  Fall precautions.  Pain management -Patient will need PT/OT eval after surgical intervention  Permanent A. Fib -Continue bisoprolol and Cardizem.  Hold Coumadin for surgical intervention -Monitor INR  Leukocytosis -Most likely reactive.  Monitor  Chronic diastolic heart failure -Currently compensated.  Strict input output.  Daily weights.  Fluid restriction.  Continue bisoprolol and ARB.  Outpatient follow-up with cardiology  Hypertension -Continue current antihypertensive regimen.  Monitor  Hypothyroidism -Continue levothyroxine  Hyperlipidemia -Continue statin  Depression -Continue fluoxetine as needed   DVT prophylaxis: SCDs. will avoid Lovenox as patient will have hip surgery in a.m. Code Status: Full Family Communication: Spoke to son-in-law at bedside Disposition Plan: Will remain inpatient for 2 to 4 days pending surgical outcome and PT eval Consults called: Orthopedics/Dr. Lorin Mercy called by ED provider Admission status: Inpatient/MedSurg  Severity of Illness: The appropriate patient status for this patient is INPATIENT. Inpatient status is judged to be reasonable and necessary in order to provide the required intensity of service to ensure the patient's safety. The patient's presenting symptoms, physical exam findings, and initial radiographic and laboratory data in the context of their chronic comorbidities is felt to place them at high risk for further clinical deterioration. Furthermore, it is not anticipated that the patient  will be medically stable for discharge from the hospital within 2 midnights of admission. The following factors support the patient status of inpatient.   " The patient's presenting symptoms include fall and hip pain. " The worrisome physical exam findings include right hip tenderness. " The initial radiographic and laboratory data are worrisome because of right hip fracture. " The chronic co-morbidities include permanent A. fib on Coumadin/chronic diastolic heart failure/ILD.   * I certify that at the point of admission it is my clinical judgment that the patient will require inpatient hospital care spanning beyond 2 midnights from the point of admission due to high intensity of service, high risk for further deterioration and high frequency of surveillance required.Aline August MD [facility]  10/27/2019, 2:02 PM

## 2019-10-27 NOTE — Progress Notes (Signed)
Paged APP on call about fluid orders as patient is NPO at midnight.   Paged to PAM, Night RN portable phone.    [place], Therapist, sports

## 2019-10-28 ENCOUNTER — Encounter (HOSPITAL_COMMUNITY): Payer: Self-pay | Admitting: Internal Medicine

## 2019-10-28 ENCOUNTER — Inpatient Hospital Stay (HOSPITAL_COMMUNITY): Payer: Medicare Other

## 2019-10-28 ENCOUNTER — Inpatient Hospital Stay (HOSPITAL_COMMUNITY): Payer: Medicare Other | Admitting: Anesthesiology

## 2019-10-28 ENCOUNTER — Encounter (HOSPITAL_COMMUNITY)
Admission: EM | Disposition: A | Discharge status: Rehabilitation Facility | Payer: Self-pay | Source: Home / Self Care | Attending: Internal Medicine

## 2019-10-28 DIAGNOSIS — S72001A Fracture of unspecified part of neck of right femur, initial encounter for closed fracture: Secondary | ICD-10-CM

## 2019-10-28 DIAGNOSIS — I340 Nonrheumatic mitral (valve) insufficiency: Secondary | ICD-10-CM | POA: Diagnosis not present

## 2019-10-28 DIAGNOSIS — I361 Nonrheumatic tricuspid (valve) insufficiency: Secondary | ICD-10-CM | POA: Diagnosis not present

## 2019-10-28 HISTORY — PX: HIP ARTHROPLASTY: SHX981

## 2019-10-28 LAB — PROTIME-INR
INR: 1.6 — ABNORMAL HIGH (ref 0.8–1.2)
Prothrombin Time: 18.6 seconds — ABNORMAL HIGH (ref 11.4–15.2)

## 2019-10-28 LAB — BASIC METABOLIC PANEL
Anion gap: 10 (ref 5–15)
BUN: 13 mg/dL (ref 8–23)
CO2: 22 mmol/L (ref 22–32)
Calcium: 8.5 mg/dL — ABNORMAL LOW (ref 8.9–10.3)
Chloride: 97 mmol/L — ABNORMAL LOW (ref 98–111)
Creatinine, Ser: 0.66 mg/dL (ref 0.44–1.00)
GFR calc Af Amer: >60 mL/min (ref >60)
GFR calc non Af Amer: >60 mL/min (ref >60)
Glucose, Bld: 117 mg/dL — ABNORMAL HIGH (ref 70–99)
Potassium: 4.7 mmol/L (ref 3.5–5.1)
Sodium: 129 mmol/L — ABNORMAL LOW (ref 135–145)

## 2019-10-28 LAB — CBC
HCT: 37.4 % (ref 36.0–46.0)
Hemoglobin: 11.7 g/dL — ABNORMAL LOW (ref 12.0–15.0)
MCH: 30.5 pg (ref 26.0–34.0)
MCHC: 31.3 g/dL (ref 30.0–36.0)
MCV: 97.4 fL (ref 80.0–100.0)
Platelets: 206 10*3/uL (ref 150–400)
RBC: 3.84 MIL/uL — ABNORMAL LOW (ref 3.87–5.11)
RDW: 13.7 % (ref 11.5–15.5)
WBC: 13.8 10*3/uL — ABNORMAL HIGH (ref 4.0–10.5)
nRBC: 0 % (ref 0.0–0.2)

## 2019-10-28 LAB — URINALYSIS, ROUTINE W REFLEX MICROSCOPIC
Bacteria, UA: NONE SEEN
Bilirubin Urine: NEGATIVE
Glucose, UA: NEGATIVE mg/dL
Ketones, ur: NEGATIVE mg/dL
Nitrite: NEGATIVE
Protein, ur: >=300 mg/dL — AB
Specific Gravity, Urine: 1.023 (ref 1.005–1.030)
WBC, UA: >50 WBC/hpf — ABNORMAL HIGH (ref 0–5)
pH: 6 (ref 5.0–8.0)

## 2019-10-28 LAB — SURGICAL PCR SCREEN
MRSA, PCR: NEGATIVE
Staphylococcus aureus: NEGATIVE

## 2019-10-28 LAB — ECHOCARDIOGRAM LIMITED
Height: 66 in
Weight: 3072.33 oz

## 2019-10-28 SURGERY — HEMIARTHROPLASTY, HIP, DIRECT ANTERIOR APPROACH, FOR FRACTURE
Anesthesia: General | Site: Hip | Laterality: Right

## 2019-10-28 MED ORDER — METOCLOPRAMIDE HCL 5 MG/ML IJ SOLN: 5.0000 mg | Freq: Three times a day (TID) | INTRAMUSCULAR | Status: DC | PRN

## 2019-10-28 MED ORDER — DEXAMETHASONE SODIUM PHOSPHATE 10 MG/ML IJ SOLN
INTRAMUSCULAR | Status: AC
  Filled 2019-10-28: qty 1

## 2019-10-28 MED ORDER — ONDANSETRON HCL 4 MG/2ML IJ SOLN
INTRAMUSCULAR | Status: DC | PRN
  Administered 2019-10-28: 4 mg via INTRAVENOUS

## 2019-10-28 MED ORDER — MORPHINE SULFATE (PF) 2 MG/ML IV SOLN
0.5000 mg | INTRAVENOUS | Status: DC | PRN
  Administered 2019-10-29: 1 mg via INTRAVENOUS
  Filled 2019-10-28: qty 1

## 2019-10-28 MED ORDER — ALBUTEROL SULFATE (2.5 MG/3ML) 0.083% IN NEBU
INHALATION_SOLUTION | RESPIRATORY_TRACT | Status: AC
  Filled 2019-10-28: qty 3

## 2019-10-28 MED ORDER — POVIDONE-IODINE 10 % EX SWAB
2.0000 "application " | Freq: Once | CUTANEOUS | Status: AC
  Administered 2019-10-28: 2 via TOPICAL

## 2019-10-28 MED ORDER — PROPOFOL 10 MG/ML IV BOLUS
INTRAVENOUS | Status: DC | PRN
  Administered 2019-10-28: 100 mg via INTRAVENOUS

## 2019-10-28 MED ORDER — PHENYLEPHRINE 40 MCG/ML (10ML) SYRINGE FOR IV PUSH (FOR BLOOD PRESSURE SUPPORT)
PREFILLED_SYRINGE | INTRAVENOUS | Status: DC | PRN
  Administered 2019-10-28: 120 ug via INTRAVENOUS
  Administered 2019-10-28: 80 ug via INTRAVENOUS

## 2019-10-28 MED ORDER — SUGAMMADEX SODIUM 200 MG/2ML IV SOLN
INTRAVENOUS | Status: DC | PRN
  Administered 2019-10-28: 350 mg via INTRAVENOUS

## 2019-10-28 MED ORDER — DOCUSATE SODIUM 100 MG PO CAPS
100.0000 mg | ORAL_CAPSULE | Freq: Two times a day (BID) | ORAL | Status: DC
  Administered 2019-10-28 – 2019-10-30 (×4): 100 mg via ORAL
  Filled 2019-10-28 (×4): qty 1

## 2019-10-28 MED ORDER — ACETAMINOPHEN 325 MG PO TABS
325.0000 mg | ORAL_TABLET | Freq: Four times a day (QID) | ORAL | Status: DC | PRN
  Administered 2019-10-30 (×2): 650 mg via ORAL
  Filled 2019-10-28 (×3): qty 2

## 2019-10-28 MED ORDER — CHLORHEXIDINE GLUCONATE 4 % EX LIQD: 60.0000 mL | Freq: Once | CUTANEOUS | Status: DC

## 2019-10-28 MED ORDER — TRAMADOL HCL 50 MG PO TABS
50.0000 mg | ORAL_TABLET | Freq: Four times a day (QID) | ORAL | Status: DC
  Administered 2019-10-29 – 2019-10-30 (×5): 50 mg via ORAL
  Filled 2019-10-28 (×5): qty 1

## 2019-10-28 MED ORDER — HYDROCODONE-ACETAMINOPHEN 5-325 MG PO TABS
1.0000 | ORAL_TABLET | ORAL | Status: DC | PRN
  Administered 2019-10-29 – 2019-10-30 (×3): 1 via ORAL
  Filled 2019-10-28 (×3): qty 1

## 2019-10-28 MED ORDER — STERILE WATER FOR IRRIGATION IR SOLN
Status: DC | PRN
  Administered 2019-10-28: 2000 mL

## 2019-10-28 MED ORDER — SODIUM CHLORIDE 0.45 % IV SOLN: INTRAVENOUS | Status: DC

## 2019-10-28 MED ORDER — WARFARIN - PHARMACIST DOSING INPATIENT: Freq: Every day | Status: DC

## 2019-10-28 MED ORDER — ROCURONIUM BROMIDE 10 MG/ML (PF) SYRINGE
PREFILLED_SYRINGE | INTRAVENOUS | Status: DC | PRN
  Administered 2019-10-28: 15 mg via INTRAVENOUS
  Administered 2019-10-28: 50 mg via INTRAVENOUS

## 2019-10-28 MED ORDER — LIDOCAINE 2% (20 MG/ML) 5 ML SYRINGE
INTRAMUSCULAR | Status: DC | PRN
  Administered 2019-10-28: 50 mg via INTRAVENOUS

## 2019-10-28 MED ORDER — HYDROCODONE-ACETAMINOPHEN 7.5-325 MG PO TABS: 1.0000 | ORAL_TABLET | ORAL | Status: DC | PRN

## 2019-10-28 MED ORDER — OXYCODONE HCL 5 MG PO TABS: 5.0000 mg | ORAL_TABLET | Freq: Once | ORAL | Status: DC | PRN

## 2019-10-28 MED ORDER — PHENOL 1.4 % MT LIQD: 1.0000 | OROMUCOSAL | Status: DC | PRN

## 2019-10-28 MED ORDER — CHLORHEXIDINE GLUCONATE CLOTH 2 % EX PADS: 6.0000 | MEDICATED_PAD | Freq: Every day | CUTANEOUS | Status: DC

## 2019-10-28 MED ORDER — ROCURONIUM BROMIDE 10 MG/ML (PF) SYRINGE
PREFILLED_SYRINGE | INTRAVENOUS | Status: AC
  Filled 2019-10-28: qty 10

## 2019-10-28 MED ORDER — FENTANYL CITRATE (PF) 100 MCG/2ML IJ SOLN
INTRAMUSCULAR | Status: DC | PRN
  Administered 2019-10-28 (×2): 50 ug via INTRAVENOUS

## 2019-10-28 MED ORDER — ONDANSETRON HCL 4 MG/2ML IJ SOLN: 4.0000 mg | Freq: Four times a day (QID) | INTRAMUSCULAR | Status: DC | PRN

## 2019-10-28 MED ORDER — MENTHOL 3 MG MT LOZG: 1.0000 | LOZENGE | OROMUCOSAL | Status: DC | PRN

## 2019-10-28 MED ORDER — ONDANSETRON HCL 4 MG/2ML IJ SOLN
INTRAMUSCULAR | Status: AC
  Filled 2019-10-28: qty 2

## 2019-10-28 MED ORDER — WARFARIN SODIUM 2.5 MG PO TABS
2.5000 mg | ORAL_TABLET | Freq: Once | ORAL | Status: AC
  Administered 2019-10-28: 2.5 mg via ORAL
  Filled 2019-10-28: qty 1

## 2019-10-28 MED ORDER — FENTANYL CITRATE (PF) 100 MCG/2ML IJ SOLN
INTRAMUSCULAR | Status: AC
  Filled 2019-10-28: qty 2

## 2019-10-28 MED ORDER — ONDANSETRON HCL 4 MG PO TABS: 4.0000 mg | ORAL_TABLET | Freq: Four times a day (QID) | ORAL | Status: DC | PRN

## 2019-10-28 MED ORDER — BUPIVACAINE HCL (PF) 0.5 % IJ SOLN
INTRAMUSCULAR | Status: AC
  Filled 2019-10-28: qty 30

## 2019-10-28 MED ORDER — ALBUTEROL SULFATE (2.5 MG/3ML) 0.083% IN NEBU
2.5000 mg | INHALATION_SOLUTION | Freq: Four times a day (QID) | RESPIRATORY_TRACT | Status: DC | PRN
  Administered 2019-10-28: 2.5 mg via RESPIRATORY_TRACT

## 2019-10-28 MED ORDER — OXYCODONE HCL 5 MG/5ML PO SOLN: 5.0000 mg | Freq: Once | ORAL | Status: DC | PRN

## 2019-10-28 MED ORDER — DEXAMETHASONE SODIUM PHOSPHATE 10 MG/ML IJ SOLN
INTRAMUSCULAR | Status: DC | PRN
  Administered 2019-10-28: 8 mg via INTRAVENOUS

## 2019-10-28 MED ORDER — METOCLOPRAMIDE HCL 5 MG PO TABS: 5.0000 mg | ORAL_TABLET | Freq: Three times a day (TID) | ORAL | Status: DC | PRN

## 2019-10-28 MED ORDER — BUPIVACAINE HCL (PF) 0.5 % IJ SOLN
INTRAMUSCULAR | Status: DC | PRN
  Administered 2019-10-28: 30 mL

## 2019-10-28 MED ORDER — CEFAZOLIN SODIUM-DEXTROSE 2-4 GM/100ML-% IV SOLN
2.0000 g | INTRAVENOUS | Status: AC
  Administered 2019-10-28: 15:00:00 2 g via INTRAVENOUS
  Filled 2019-10-28: qty 100

## 2019-10-28 MED ORDER — PROPOFOL 10 MG/ML IV BOLUS
INTRAVENOUS | Status: AC
  Filled 2019-10-28: qty 20

## 2019-10-28 MED ORDER — 0.9 % SODIUM CHLORIDE (POUR BTL) OPTIME
TOPICAL | Status: DC | PRN
  Administered 2019-10-28: 1000 mL

## 2019-10-28 MED ORDER — TRANEXAMIC ACID-NACL 1000-0.7 MG/100ML-% IV SOLN
1000.0000 mg | INTRAVENOUS | Status: AC
  Administered 2019-10-28: 1000 mg via INTRAVENOUS
  Filled 2019-10-28: qty 100

## 2019-10-28 MED ORDER — FENTANYL CITRATE (PF) 100 MCG/2ML IJ SOLN: 25.0000 ug | INTRAMUSCULAR | Status: DC | PRN

## 2019-10-28 SURGICAL SUPPLY — 63 items
APL PRP STRL LF DISP 70% ISPRP (MISCELLANEOUS) ×1
BIPOLAR PROS AML 52 (Hips) ×2 IMPLANT
BLADE SAW SGTL 73X25 THK (BLADE) ×2 IMPLANT
BRUSH FEMORAL CANAL (MISCELLANEOUS) IMPLANT
CHLORAPREP W/TINT 26 (MISCELLANEOUS) ×2 IMPLANT
COVER SURGICAL LIGHT HANDLE (MISCELLANEOUS) ×2 IMPLANT
COVER WAND RF STERILE (DRAPES) IMPLANT
DRAPE INCISE IOBAN 66X45 STRL (DRAPES) ×2 IMPLANT
DRAPE ORTHO SPLIT 77X108 STRL (DRAPES) ×4
DRAPE POUCH INSTRU U-SHP 10X18 (DRAPES) ×2 IMPLANT
DRAPE SURG ORHT 6 SPLT 77X108 (DRAPES) ×2 IMPLANT
DRAPE U-SHAPE 47X51 STRL (DRAPES) ×2 IMPLANT
DRAPE WARM FLUID 44X44 (DRAPES) ×2 IMPLANT
DRSG PAD ABDOMINAL 8X10 ST (GAUZE/BANDAGES/DRESSINGS) ×1 IMPLANT
ELECT BLADE TIP CTD 4 INCH (ELECTRODE) ×2 IMPLANT
ELECT REM PT RETURN 15FT ADLT (MISCELLANEOUS) ×2 IMPLANT
EVACUATOR 1/8 PVC DRAIN (DRAIN) ×2 IMPLANT
FACESHIELD WRAPAROUND (MASK) ×10 IMPLANT
FACESHIELD WRAPAROUND OR TEAM (MASK) ×5 IMPLANT
GAUZE SPONGE 4X4 12PLY STRL (GAUZE/BANDAGES/DRESSINGS) ×3 IMPLANT
GAUZE XEROFORM 1X8 LF (GAUZE/BANDAGES/DRESSINGS) ×1 IMPLANT
GAUZE XEROFORM 5X9 LF (GAUZE/BANDAGES/DRESSINGS) ×2 IMPLANT
GLOVE BIOGEL PI IND STRL 8 (GLOVE) ×1 IMPLANT
GLOVE BIOGEL PI INDICATOR 8 (GLOVE) ×1
GLOVE ORTHO TXT STRL SZ7.5 (GLOVE) ×2 IMPLANT
GOWN STRL REUS W/TWL LRG LVL3 (GOWN DISPOSABLE) ×2 IMPLANT
HANDPIECE INTERPULSE COAX TIP (DISPOSABLE)
HEAD BIPOLAR PROS AML 52 (Hips) IMPLANT
HEAD FEM STD 28X+1.5 STRL (Hips) ×1 IMPLANT
IMMOBILIZER KNEE 20 (SOFTGOODS) ×2 IMPLANT
IMMOBILIZER KNEE 20 THIGH 36 (SOFTGOODS) ×1 IMPLANT
KIT BASIN (CUSTOM PROCEDURE TRAY) ×2 IMPLANT
KIT TURNOVER KIT A (KITS) IMPLANT
NDL MA TROC 1/2 (NEEDLE) IMPLANT
NDL MAYO CATGUT SZ4 TPR NDL (NEEDLE) ×1 IMPLANT
NEEDLE HYPO 22GX1.5 SAFETY (NEEDLE) ×1 IMPLANT
NEEDLE MA TROC 1/2 (NEEDLE) ×2 IMPLANT
NEEDLE MAYO CATGUT SZ4 (NEEDLE) ×2 IMPLANT
NS IRRIG 1000ML POUR BTL (IV SOLUTION) ×4 IMPLANT
PACK TOTAL JOINT (CUSTOM PROCEDURE TRAY) ×2 IMPLANT
PASSER SUT SWANSON 36MM LOOP (INSTRUMENTS) ×2 IMPLANT
PENCIL SMOKE EVACUATOR (MISCELLANEOUS) ×1 IMPLANT
PROTECTOR NERVE ULNAR (MISCELLANEOUS) ×2 IMPLANT
SET HNDPC FAN SPRY TIP SCT (DISPOSABLE) IMPLANT
SPONGE LAP 18X18 RF (DISPOSABLE) ×1 IMPLANT
STAPLER VISISTAT 35W (STAPLE) ×2 IMPLANT
STEM FEMORAL SZ 6MM STD ACTIS (Stem) ×1 IMPLANT
SUCTION FRAZIER HANDLE 12FR (TUBING) ×2
SUCTION TUBE FRAZIER 12FR DISP (TUBING) ×1 IMPLANT
SUT ETHIBOND NAB CT1 #1 30IN (SUTURE) ×9 IMPLANT
SUT VIC AB 0 CT1 27 (SUTURE) ×2
SUT VIC AB 0 CT1 27XBRD ANTBC (SUTURE) ×1 IMPLANT
SUT VIC AB 1 CT1 27 (SUTURE)
SUT VIC AB 1 CT1 27XBRD ANTBC (SUTURE) IMPLANT
SUT VIC AB 1 CT1 36 (SUTURE) ×1 IMPLANT
SUT VIC AB 1 CTX 36 (SUTURE)
SUT VIC AB 1 CTX36XBRD ANBCTR (SUTURE) IMPLANT
SUT VIC AB 2-0 CT1 36 (SUTURE) ×6 IMPLANT
SYR 30ML LL (SYRINGE) IMPLANT
TOWEL OR 17X26 10 PK STRL BLUE (TOWEL DISPOSABLE) ×4 IMPLANT
TOWER CARTRIDGE SMART MIX (DISPOSABLE) IMPLANT
TRAY FOLEY MTR SLVR 16FR STAT (SET/KITS/TRAYS/PACK) ×2 IMPLANT
WATER STERILE IRR 1000ML POUR (IV SOLUTION) ×2 IMPLANT

## 2019-10-28 NOTE — Progress Notes (Signed)
PROGRESS NOTE    Stephanie Cortez    Code Status: Full Code  [id number] DOB: 11-02-1933 DOA: 10/27/2019 LOS: 1 days  PCP: [name], MD CC:  Chief Complaint  Patient presents with  . Fall  . Hip Pain       Hospital Summary   This is an 86 year old female with history of permanent atrial fibrillation on Coumadin, chronic diastolic heart failure, hypertension, suspected ILD, hypothyroidism, hyperlipidemia, depression with recent fall after tripping over her dog landing on right hip.  In the ED, patient had CT of the head which was negative for acute abnormalities, x-ray of pelvis showed acute mildly displaced subcapital fracture of the proximal right femur.  Orthopedic surgery was consulted.    A & P   Principal Problem:   Closed right hip fracture, initial encounter ([place]) Active Problems:   HYPERTENSION, BENIGN   A-fib (HCC)   Congestive heart failure (HCC)   ILD (interstitial lung disease) ([place])   1. Acute impacted subcapital fracture of proximal right femur secondary to mechanical fall from standing height  a. Orthopedic surgery consulted, plan for surgery today b. PT eval  2. Permanent atrial fibrillation a. INR 1.6 b. Coumadin on hold for surgery and to be managed by pharmacy c. Continue bisoprolol and Cardizem  3. Hypertension a. Soft BP this a.m. prior to receiving antihypertensives (irbesartan and bisoprolol, gets Cardizem this evening) which has not been rechecked b. repeat BP and hold antihypertensives as necessary  4. Chronic diastolic heart failure, not in acute decompensation a. Echo today: EF 60 to 65% with moderately reduced RV systolic function and moderately enlarged RV with moderately elevated pulmonary artery systolic pressure b. I/O and daily weights with fluid restriction c. Continue home medications  5. Hypothyroidism on levothyroxine  6. Hyperlipidemia on statin  7. Depression on fluoxetine, as needed?  8. Proteinuria a. Continue  ARB b. Consider outpatient nephrology at discharge  9. Chronic ILD on 2 L nasal cannula at baseline  10. Abnormal CXR a. CXR with new superimposed confluent airspace consolidation in LUL concerning for superimposed pneumonia number patient is afebrile and without cough so this is unlikely pneumonia at this time. will continue to monitor   DVT prophylaxis: SCD Family Communication: Patient's family at bedside has been updated  Disposition Plan:  Status is: Inpatient  Remains inpatient appropriate because:Inpatient level of care appropriate due to severity of illness and Surgery   Dispo: The patient is from: Home              Anticipated d/c is to: TBD              Anticipated d/c date is: 2 days              Patient currently is not medically stable to d/c.           Pressure injury documentation    None  Consultants  Orthopedic surgery   Procedures  None  Antibiotics   Anti-infectives (From admission, onward)   Start     Dose/Rate Route Frequency Ordered Stop   10/28/19 1400  ceFAZolin (ANCEF) IVPB 2g/100 mL premix     2 g 200 mL/hr over 30 Minutes Intravenous On call to O.R. 10/28/19 [id number] 10/29/19 0559        Subjective   Patient seen and examined at bedside in no acute distress and resting comfortably. No acute events overnight. Denies any acute complaints at this time.   Objective   Vitals:  10/27/19 2136 10/27/19 2147 10/27/19 2148 10/28/19 0550  BP:  (!) 155/67 (!) 155/67 (!) 92/56  Pulse:  77 91 99  Resp:  (!) 22 (!) 22 18  Temp:  98.1 F (36.7 C) 98.1 F (36.7 C) 98.2 F (36.8 C)  TempSrc:  Oral Oral Oral  SpO2: 92% (!) 89% 96% 91%  Weight:      Height:        Intake/Output Summary (Last 24 hours) at 10/28/2019 1248 Last data filed at 10/28/2019 1000 Gross per 24 hour  Intake 1121.19 ml  Output 200 ml  Net 921.19 ml   Filed Weights   10/27/19 1502  Weight: 87.1 kg    Examination:  Physical Exam Vitals and nursing note  reviewed.  Constitutional:      Appearance: Normal appearance.  HENT:     Head: Normocephalic and atraumatic.  Eyes:     Conjunctiva/sclera: Conjunctivae normal.  Cardiovascular:     Rate and Rhythm: Normal rate and regular rhythm.  Pulmonary:     Comments: Bilateral upper lobe rales, L > R while lying flat. On 2 L nasal cannula Abdominal:     General: Abdomen is flat.     Palpations: Abdomen is soft.  Musculoskeletal:     Comments: Going on left hip, internally rotated right leg  Skin:    Coloration: Skin is not jaundiced or pale.  Neurological:     Mental Status: She is alert. Mental status is at baseline.  Psychiatric:        Mood and Affect: Mood normal.        Behavior: Behavior normal.     Data Reviewed: I have personally reviewed following labs and imaging studies  CBC: Recent Labs  Lab 10/27/19 1200 10/28/19 0250  WBC 15.6* 13.8*  NEUTROABS 14.2*  --   HGB 13.1 11.7*  HCT 41.7 37.4  MCV 96.8 97.4  PLT 253 [id number]   Basic Metabolic Panel: Recent Labs  Lab 10/27/19 1200 10/28/19 0250  NA 132* 129*  K 4.4 4.7  CL 99 97*  CO2 21* 22  GLUCOSE 146* 117*  BUN 12 13  CREATININE 0.54 0.66  CALCIUM 8.8* 8.5*   GFR: Estimated Creatinine Clearance: 58.2 mL/min (by C-G formula based on SCr of 0.66 mg/dL). Liver Function Tests: Recent Labs  Lab 10/27/19 1200  AST 30  ALT 18  ALKPHOS 100  BILITOT 1.0  PROT 6.6  ALBUMIN 3.4*   No results for input(s): LIPASE, AMYLASE in the last 168 hours. No results for input(s): AMMONIA in the last 168 hours. Coagulation Profile: Recent Labs  Lab 10/27/19 1159 10/28/19 0250  INR 1.5* 1.6*   Cardiac Enzymes: No results for input(s): CKTOTAL, CKMB, CKMBINDEX, TROPONINI in the last 168 hours. BNP (last 3 results) No results for input(s): PROBNP in the last 8760 hours. HbA1C: No results for input(s): HGBA1C in the last 72 hours. CBG: No results for input(s): GLUCAP in the last 168 hours. Lipid Profile: No results  for input(s): CHOL, HDL, LDLCALC, TRIG, CHOLHDL, LDLDIRECT in the last 72 hours. Thyroid Function Tests: No results for input(s): TSH, T4TOTAL, FREET4, T3FREE, THYROIDAB in the last 72 hours. Anemia Panel: No results for input(s): VITAMINB12, FOLATE, FERRITIN, TIBC, IRON, RETICCTPCT in the last 72 hours. Sepsis Labs: No results for input(s): PROCALCITON, LATICACIDVEN in the last 168 hours.  Recent Results (from the past 240 hour(s))  Respiratory Panel by RT PCR (Flu A&B, Covid) - Nasopharyngeal Swab     Status: None  Collection Time: 10/27/19  1:12 PM   Specimen: Nasopharyngeal Swab  Result Value Ref Range Status   SARS Coronavirus 2 by RT PCR NEGATIVE NEGATIVE Final    Comment: (NOTE) SARS-CoV-2 target nucleic acids are NOT DETECTED. The SARS-CoV-2 RNA is generally detectable in upper respiratoy specimens during the acute phase of infection. The lowest concentration of SARS-CoV-2 viral copies this assay can detect is 131 copies/mL. A negative result does not preclude SARS-Cov-2 infection and should not be used as the sole basis for treatment or other patient management decisions. A negative result may occur with  improper specimen collection/handling, submission of specimen other than nasopharyngeal swab, presence of viral mutation(s) within the areas targeted by this assay, and inadequate number of viral copies (<131 copies/mL). A negative result must be combined with clinical observations, patient history, and epidemiological information. The expected result is Negative. Fact Sheet for Patients:  PinkCheek.be Fact Sheet for Healthcare Providers:  GravelBags.it This test is not yet ap proved or cleared by the [place] FDA and  has been authorized for detection and/or diagnosis of SARS-CoV-2 by FDA under an Emergency Use Authorization (EUA). This EUA will remain  in effect (meaning this test can be used) for the  duration of the COVID-19 declaration under Section 564(b)(1) of the Act, 21 U.S.C. section 360bbb-3(b)(1), unless the authorization is terminated or revoked sooner.    Influenza A by PCR NEGATIVE NEGATIVE Final   Influenza B by PCR NEGATIVE NEGATIVE Final    Comment: (NOTE) The Xpert Xpress SARS-CoV-2/FLU/RSV assay is intended as an aid in  the diagnosis of influenza from Nasopharyngeal swab specimens and  should not be used as a sole basis for treatment. Nasal washings and  aspirates are unacceptable for Xpert Xpress SARS-CoV-2/FLU/RSV  testing. Fact Sheet for Patients: PinkCheek.be Fact Sheet for Healthcare Providers: GravelBags.it This test is not yet approved or cleared by the [place] FDA and  has been authorized for detection and/or diagnosis of SARS-CoV-2 by  FDA under an Emergency Use Authorization (EUA). This EUA will remain  in effect (meaning this test can be used) for the duration of the  Covid-19 declaration under Section 564(b)(1) of the Act, 21  U.S.C. section 360bbb-3(b)(1), unless the authorization is  terminated or revoked. Performed at [facility], [place] [address], [place], [place] [address]   Surgical PCR screen     Status: None   Collection Time: 10/28/19  3:00 AM   Specimen: Nasal Mucosa; Nasal Swab  Result Value Ref Range Status   MRSA, PCR NEGATIVE NEGATIVE Final   Staphylococcus aureus NEGATIVE NEGATIVE Final    Comment: (NOTE) The Xpert SA Assay (FDA approved for NASAL specimens in patients 71 years of age and older), is one component of a comprehensive surveillance program. It is not intended to diagnose infection nor to guide or monitor treatment. Performed at [facility], [place] [address] [place], [place] [address]          Radiology Studies: DG Pelvis 1-2 Views  Result Date: 10/27/2019 CLINICAL DATA:  Fall, right hip pain EXAM: PELVIS -  1-2 VIEW COMPARISON:  None. FINDINGS: Acute, mildly impacted subcapital fracture of the proximal right femur. No significant displacement or angulation evident on AP view. Moderate osteoarthritis of the hips, right worse than left. No dislocation. SI joints are intact mild-to-moderate osteoarthritis. No SI joint or pubic symphysis diastasis. IMPRESSION: Acute, mildly impacted subcapital fracture of the proximal right femur. With Electronically Signed   By: Hart Carwin  Plundo D.O.   On: 10/27/2019 12:15   CT Head Wo Contrast  Result Date: 10/27/2019 CLINICAL DATA:  86 year old female status post fall last night at 1930 hours. Pain. EXAM: CT HEAD WITHOUT CONTRAST TECHNIQUE: Contiguous axial images were obtained from the base of the skull through the vertex without intravenous contrast. COMPARISON:  Brain MRI 04/07/2012.  Head CT 01/20/2009. FINDINGS: Brain: Cerebral volume has not significantly changed and remains within normal limits. No midline shift, ventriculomegaly, mass effect, evidence of mass lesion, intracranial hemorrhage or evidence of cortically based acute infarction. Chronic perivascular space at the right inferior basal ganglia (normal variant). Normal for age gray-white matter differentiation throughout the brain. Vascular: Calcified atherosclerosis at the skull base. Skull: No acute osseous abnormality identified. Sinuses/Orbits: Visualized paranasal sinuses and mastoids are stable and well pneumatized. Other: No acute orbit or scalp soft tissue finding. IMPRESSION: No acute intracranial abnormality or acute traumatic injury identified. Normal for age non contrast CT appearance of the brain. Electronically Signed   By: Genevie [name] M.D.   On: 10/27/2019 11:55   DG Chest Port 1 View  Result Date: 10/27/2019 CLINICAL DATA:  Fracture. Status post fall. Patient reports weakness. EXAM: PORTABLE CHEST 1 VIEW COMPARISON:  Chest CT 07/17/2019, chest radiograph 08/07/2017 FINDINGS: Unchanged cardiomegaly.  Aortic atherosclerosis. Redemonstrated findings consistent with known chronic interstitial lung disease most notable within the upper to mid lung fields. New from prior exams, there is superimposed more confluent airspace consolidation within the left upper lobe and left lung base which is nonspecific, but suspicious for superimposed pneumonia. A small left pleural effusion may be present. No evidence of pneumothorax. Chronic fracture deformity of the proximal left humerus. No acute bony abnormality is identified. IMPRESSION: Redemonstrated findings consistent with known chronic interstitial lung disease, most notably within the upper to mid lungs. New from prior exams, there is superimposed confluent airspace consolidation within the left upper lobe and left lung base which is nonspecific, but suspicious for superimposed pneumonia. Radiographic follow-up is recommended. A small left pleural effusion may be present. Unchanged cardiomegaly. Aortic Atherosclerosis (ICD10-I70.0). Electronically Signed   By: Kellie Simmering DO   On: 10/27/2019 13:15   ECHOCARDIOGRAM LIMITED  Result Date: 10/28/2019    ECHOCARDIOGRAM LIMITED REPORT   Patient Name:   Stephanie Cortez Date of Exam: 10/28/2019 Medical Rec #:  [id number]       Height:       66.0 in Accession #:    [id number]      Weight:       192.0 lb Date of Birth:  07/02/1934       BSA:          1.965 m Patient Age:    77 years        BP:           92/56 mmHg Patient Gender: F               HR:           99 bpm. Exam Location:  Inpatient Procedure: Limited Echo STAT ECHO Indications:    perioperative evaluation  History:        Patient has prior history of Echocardiogram examinations, most                 recent 06/30/2013. CHF, COPD, Arrythmias:Atrial Fibrillation;                 Risk Factors:Hypertension and Dyslipidemia.  Sonographer:    Jannett Celestine RDCS (AE)  Referring Phys: [place] Comments: restricted mobility IMPRESSIONS  1. Left ventricular  ejection fraction, by estimation, is 60 to 65%. The left ventricle has normal function. The left ventricle has no regional wall motion abnormalities. Unable to assess LV diastolic filling due to underlying atrial fibrillation.  2. Right ventricular systolic function is moderately reduced. The right ventricular size is moderately enlarged. There is moderately elevated pulmonary artery systolic pressure. The estimated right ventricular systolic pressure is [id number] mmHg.  3. Right atrial size was severely dilated.  4. The mitral valve is normal in structure. Mild mitral valve regurgitation. No evidence of mitral stenosis.  5. Tricuspid valve regurgitation is mild to moderate.  6. The aortic valve is tricuspid. Aortic valve regurgitation is not visualized. Mild aortic valve sclerosis is present, with no evidence of aortic valve stenosis.  7. The inferior vena cava is dilated in size with <50% respiratory variability, suggesting right atrial pressure of 15 mmHg. FINDINGS  Left Ventricle: Left ventricular ejection fraction, by estimation, is 60 to 65%. The left ventricle has normal function. The left ventricle has no regional wall motion abnormalities. The left ventricular internal cavity size was normal in size. There is  no left ventricular hypertrophy. Right Ventricle: The right ventricular size is moderately enlarged. No increase in right ventricular wall thickness. Right ventricular systolic function is moderately reduced. There is moderately elevated pulmonary artery systolic pressure. The tricuspid  regurgitant velocity is 3.37 m/s, and with an assumed right atrial pressure of 15 mmHg, the estimated right ventricular systolic pressure is [id number] mmHg. Left Atrium: Left atrial size was normal in size. Right Atrium: Right atrial size was severely dilated. Pericardium: There is no evidence of pericardial effusion. Mitral Valve: The mitral valve is normal in structure. Normal mobility of the mitral valve leaflets. Mild  mitral annular calcification. Mild mitral valve regurgitation. No evidence of mitral valve stenosis. Tricuspid Valve: The tricuspid valve is normal in structure. Tricuspid valve regurgitation is mild to moderate. No evidence of tricuspid stenosis. Aortic Valve: The aortic valve is tricuspid. Aortic valve regurgitation is not visualized. Mild aortic valve sclerosis is present, with no evidence of aortic valve stenosis. Pulmonic Valve: The pulmonic valve was normal in structure. Pulmonic valve regurgitation is mild. No evidence of pulmonic stenosis. Aorta: The aortic root is normal in size and structure. Venous: The inferior vena cava is dilated in size with less than 50% respiratory variability, suggesting right atrial pressure of 15 mmHg. IAS/Shunts: No atrial level shunt detected by color flow Doppler.  TRICUSPID VALVE TR Peak grad:   45.4 mmHg TR Vmax:        337.00 cm/s Fransico Him MD Electronically signed by Fransico Him MD Signature Date/Time: 10/28/2019/10:44:56 AM    Final         Scheduled Meds: . bisoprolol  10 mg Oral Daily  . chlorhexidine  60 mL Topical Once  . diltiazem  240 mg Oral QHS  . irbesartan  300 mg Oral Daily  . levothyroxine  50 mcg Oral QAC breakfast  . mometasone-formoterol  2 puff Inhalation BID  . mupirocin ointment  1 application Nasal BID  . pravastatin  40 mg Oral QHS   Continuous Infusions: .  ceFAZolin (ANCEF) IV    . lactated ringers 20 mL/hr at 10/28/19 1000  . tranexamic acid       Time spent: 28 minutes with over 50% of the time coordinating the patient's care    Harold Hedge, DO [facility] Pager  [phone]  Call night coverage person covering after 7pm

## 2019-10-28 NOTE — H&P (View-Only) (Signed)
Reason for Consult:fall with right femoral neck fracture, closed.  Referring Physician: Lodema Hong MD  Stephanie Cortez is an 86 y.o. female.  HPI: 86 year old female with portable oxygen use household and occasional community ambulator tripped over her dog with a fall injuring her right hip.  Patient's son is present to help with history.  Patient has positive history of atrial fib on chronic Coumadin.  INR at last 24 hours has been 1.5 and 1.6.  Patient has history of hyperlipidemia hypertension and chronic diastolic heart failure.  Patient lives alone.  History of bronchiectasis, COPD diverticulitis and GERD.  No history of hip osteoarthritis.  Past Medical History:  Diagnosis Date  . Anemia    - Hgb 9.7gm% on 07/13/2008 in [place] -  Hgg 129gm% wiht normal irone levsl and ferritin 10/27/2008 in [place] Recurrent otitis/sinusitis  . Anxiety    chronic BZ prn  . Atrial fibrillation (HCC)    chronic anticoag  . Bronchiectasis    >PFT 07/13/2008 in [place] 1.9L/76%, FVC 2.45L/74, Ratio 79, TLC 121%, DLCO 64%  AE BRonchiectasis - Dec 2010.New Rx:  outpatient - Feb 2011 - Rx outpatient  . CHF (congestive heart failure) ([place])   . COPD (chronic obstructive pulmonary disease) (HCC)    bronchiectasis  . Cricopharyngeal achalasia   . Depression   . Diverticulosis   . Dyslipidemia   . Eczema   . GERD with stricture   . Glaucoma   . H. pylori infection   . Hypertension   . Hyponatremia    chronic, s/p endo eval 06/2012  . Hypothyroid   . IBS (irritable bowel syndrome)   . Lumbar disc disease   . Neuropathy of both feet   . Segmental colitis ([place])   . Vertigo   . Wears glasses     Past Surgical History:  Procedure Laterality Date  . BREAST SURGERY     br bx  . CARDIAC CATHETERIZATION  07/01/2013  . CATARACT EXTRACTION     both  . COLONOSCOPY    . ESOPHAGOSCOPY W/ BOTOX INJECTION  12/11/2011   Procedure: ESOPHAGOSCOPY WITH BOTOX INJECTION;  Surgeon: Rozetta Nunnery, MD;   Location: [facility];  Service: ENT;  Laterality: N/A;  esophogoscopy with dilation, botox injection  . FOOT SURGERY  03/14/2011   gastroc slide-rt  . TOTAL ABDOMINAL HYSTERECTOMY    . WISDOM TOOTH EXTRACTION      Family History  Problem Relation Age of Onset  . Hypertension Mother   . Stroke Mother   . Emphysema Brother   . Other Father        miner's lung  . Cancer Father        Lung  . Colon polyps Sister   . Pancreatic cancer Sister   . Kidney disease Sister   . Atrial fibrillation Other        siblings    Social History:  reports that she has never smoked. She has never used smokeless tobacco. She reports that she does not drink alcohol or use drugs.  Allergies:  Allergies  Allergen Reactions  . Doxycycline     Rash on face and neck  . Flagyl [Metronidazole] Nausea And Vomiting  . Moxifloxacin Other (See Comments)     Weakness/fatigue  . Pantoprazole Sodium Rash  . Penicillins Rash    Has patient had a PCN reaction causing immediate rash, facial/tongue/throat swelling, SOB or lightheadedness with hypotension:unsure Has patient had a PCN reaction causing severe rash  involving mucus membranes or skin necrosis:unsure Has patient had a PCN reaction that required hospitalization:No Has patient had a PCN reaction occurring within the last 10 years:Yes Cannot recall exact reaction due to time lapse If all of the above answers are "NO", then may proceed with Cephalosporin use.     Medications: I have reviewed the patient's current medications.  Results for orders placed or performed during the hospital encounter of 10/27/19 (from the past 48 hour(s))  Protime-INR     Status: Abnormal   Collection Time: 10/27/19 11:59 AM  Result Value Ref Range   Prothrombin Time 17.9 (H) 11.4 - 15.2 seconds   INR 1.5 (H) 0.8 - 1.2    Comment: (NOTE) INR goal varies based on device and disease states. Performed at [facility], [place] [address] [place], [place] [address]   Comprehensive metabolic panel     Status: Abnormal   Collection Time: 10/27/19 12:00 PM  Result Value Ref Range   Sodium 132 (L) 135 - 145 mmol/L   Potassium 4.4 3.5 - 5.1 mmol/L   Chloride 99 98 - 111 mmol/L   CO2 21 (L) 22 - 32 mmol/L   Glucose, Bld 146 (H) 70 - 99 mg/dL    Comment: Glucose reference range applies only to samples taken after fasting for at least 8 hours.   BUN 12 8 - 23 mg/dL   Creatinine, Ser 0.54 0.44 - 1.00 mg/dL   Calcium 8.8 (L) 8.9 - 10.3 mg/dL   Total Protein 6.6 6.5 - 8.1 g/dL   Albumin 3.4 (L) 3.5 - 5.0 g/dL   AST 30 15 - 41 U/L   ALT 18 0 - 44 U/L   Alkaline Phosphatase 100 38 - 126 U/L   Total Bilirubin 1.0 0.3 - 1.2 mg/dL   GFR calc non Af Amer >60 >60 mL/min   GFR calc Af Amer >60 >60 mL/min   Anion gap 12 5 - 15    Comment: Performed at [facility], [place] [address] [place], [place] [address]  CBC with Differential     Status: Abnormal   Collection Time: 10/27/19 12:00 PM  Result Value Ref Range   WBC 15.6 (H) 4.0 - 10.5 K/uL   RBC 4.31 3.87 - 5.11 MIL/uL   Hemoglobin 13.1 12.0 - 15.0 g/dL   HCT 41.7 36.0 - 46.0 %   MCV 96.8 80.0 - 100.0 fL   MCH 30.4 26.0 - 34.0 pg   MCHC 31.4 30.0 - 36.0 g/dL   RDW 13.6 11.5 - 15.5 %   Platelets 253 150 - 400 K/uL   nRBC 0.0 0.0 - 0.2 %   Neutrophils Relative % 90 %   Neutro Abs 14.2 (H) 1.7 - 7.7 K/uL   Lymphocytes Relative 4 %   Lymphs Abs 0.5 (L) 0.7 - 4.0 K/uL   Monocytes Relative 5 %   Monocytes Absolute 0.7 0.1 - 1.0 K/uL   Eosinophils Relative 0 %   Eosinophils Absolute 0.0 0.0 - 0.5 K/uL   Basophils Relative 0 %   Basophils Absolute 0.1 0.0 - 0.1 K/uL   Immature Granulocytes 1 %   Abs Immature Granulocytes 0.09 (H) 0.00 - 0.07 K/uL    Comment: Performed at [facility], [place] [address], [place], [place] [address]  Respiratory Panel by RT PCR (Flu A&B, Covid) - Nasopharyngeal Swab     Status: None   Collection Time: 10/27/19   1:12 PM   Specimen: Nasopharyngeal Swab  Result Value Ref Range   SARS Coronavirus 2 by RT PCR NEGATIVE NEGATIVE    Comment: (NOTE) SARS-CoV-2 target nucleic acids are NOT DETECTED. The SARS-CoV-2 RNA is generally detectable in upper respiratoy specimens during the acute phase of infection. The lowest concentration of SARS-CoV-2 viral copies this assay can detect is 131 copies/mL. A negative result does not preclude SARS-Cov-2 infection and should not be used as the sole basis for treatment or other patient management decisions. A negative result may occur with  improper specimen collection/handling, submission of specimen other than nasopharyngeal swab, presence of viral mutation(s) within the areas targeted by this assay, and inadequate number of viral copies (<131 copies/mL). A negative result must be combined with clinical observations, patient history, and epidemiological information. The expected result is Negative. Fact Sheet for Patients:  PinkCheek.be Fact Sheet for Healthcare Providers:  GravelBags.it This test is not yet ap proved or cleared by the [place] FDA and  has been authorized for detection and/or diagnosis of SARS-CoV-2 by FDA under an Emergency Use Authorization (EUA). This EUA will remain  in effect (meaning this test can be used) for the duration of the COVID-19 declaration under Section 564(b)(1) of the Act, 21 U.S.C. section 360bbb-3(b)(1), unless the authorization is terminated or revoked sooner.    Influenza A by PCR NEGATIVE NEGATIVE   Influenza B by PCR NEGATIVE NEGATIVE    Comment: (NOTE) The Xpert Xpress SARS-CoV-2/FLU/RSV assay is intended as an aid in  the diagnosis of influenza from Nasopharyngeal swab specimens and  should not be used as a sole basis for treatment. Nasal washings and  aspirates are unacceptable for Xpert Xpress SARS-CoV-2/FLU/RSV  testing. Fact Sheet for  Patients: PinkCheek.be Fact Sheet for Healthcare Providers: GravelBags.it This test is not yet approved or cleared by the [place] FDA and  has been authorized for detection and/or diagnosis of SARS-CoV-2 by  FDA under an Emergency Use Authorization (EUA). This EUA will remain  in effect (meaning this test can be used) for the duration of the  Covid-19 declaration under Section 564(b)(1) of the Act, 21  U.S.C. section 360bbb-3(b)(1), unless the authorization is  terminated or revoked. Performed at [facility], [place] [address] [place], [place] [address]   CBC     Status: Abnormal   Collection Time: 10/28/19  2:50 AM  Result Value Ref Range   WBC 13.8 (H) 4.0 - 10.5 K/uL   RBC 3.84 (L) 3.87 - 5.11 MIL/uL   Hemoglobin 11.7 (L) 12.0 - 15.0 g/dL   HCT 37.4 36.0 - 46.0 %   MCV 97.4 80.0 - 100.0 fL   MCH 30.5 26.0 - 34.0 pg   MCHC 31.3 30.0 - 36.0 g/dL   RDW 13.7 11.5 - 15.5 %   Platelets 206 150 - 400 K/uL   nRBC 0.0 0.0 - 0.2 %    Comment: Performed at [facility], [place] [address] [place], [place] [address]  Protime-INR     Status: Abnormal   Collection Time: 10/28/19  2:50 AM  Result Value Ref Range   Prothrombin Time 18.6 (H) 11.4 - 15.2 seconds   INR 1.6 (H) 0.8 - 1.2    Comment: (NOTE) INR goal varies based on device and disease states. Performed at [facility], [place] [address] [place], [place] [id number]   Basic metabolic panel     Status: Abnormal   Collection Time: 10/28/19  2:50 AM  Result Value Ref Range   Sodium 129 (  L) 135 - 145 mmol/L   Potassium 4.7 3.5 - 5.1 mmol/L   Chloride 97 (L) 98 - 111 mmol/L   CO2 22 22 - 32 mmol/L   Glucose, Bld 117 (H) 70 - 99 mg/dL    Comment: Glucose reference range applies only to samples taken after fasting for at least 8 hours.   BUN 13 8 - 23 mg/dL   Creatinine, Ser 0.66 0.44 - 1.00 mg/dL   Calcium 8.5 (L)  8.9 - 10.3 mg/dL   GFR calc non Af Amer >60 >60 mL/min   GFR calc Af Amer >60 >60 mL/min   Anion gap 10 5 - 15    Comment: Performed at [facility], [place] [address], [place], [place] [address]  Surgical PCR screen     Status: None   Collection Time: 10/28/19  3:00 AM   Specimen: Nasal Mucosa; Nasal Swab  Result Value Ref Range   MRSA, PCR NEGATIVE NEGATIVE   Staphylococcus aureus NEGATIVE NEGATIVE    Comment: (NOTE) The Xpert SA Assay (FDA approved for NASAL specimens in patients 55 years of age and older), is one component of a comprehensive surveillance program. It is not intended to diagnose infection nor to guide or monitor treatment. Performed at [facility], [place] [address] [place], [place] [address]   Urinalysis, Routine w reflex microscopic     Status: Abnormal   Collection Time: 10/28/19  6:08 AM  Result Value Ref Range   Color, Urine AMBER (A) YELLOW    Comment: BIOCHEMICALS MAY BE AFFECTED BY COLOR   APPearance HAZY (A) CLEAR   Specific Gravity, Urine 1.023 1.005 - 1.030   pH 6.0 5.0 - 8.0   Glucose, UA NEGATIVE NEGATIVE mg/dL   Hgb urine dipstick MODERATE (A) NEGATIVE   Bilirubin Urine NEGATIVE NEGATIVE   Ketones, ur NEGATIVE NEGATIVE mg/dL   Protein, ur >=300 (A) NEGATIVE mg/dL   Nitrite NEGATIVE NEGATIVE   Leukocytes,Ua SMALL (A) NEGATIVE   RBC / HPF 21-50 0 - 5 RBC/hpf   WBC, UA >50 (H) 0 - 5 WBC/hpf   Bacteria, UA NONE SEEN NONE SEEN   Squamous Epithelial / LPF 0-5 0 - 5    Comment: Performed at [facility], [place] [address], [place], [place] [address]    DG Pelvis 1-2 Views  Result Date: 10/27/2019 CLINICAL DATA:  Fall, right hip pain EXAM: PELVIS - 1-2 VIEW COMPARISON:  None. FINDINGS: Acute, mildly impacted subcapital fracture of the proximal right femur. No significant displacement or angulation evident on AP view. Moderate osteoarthritis of the hips, right worse than left. No dislocation. SI  joints are intact mild-to-moderate osteoarthritis. No SI joint or pubic symphysis diastasis. IMPRESSION: Acute, mildly impacted subcapital fracture of the proximal right femur. With Electronically Signed   By: Davina Poke D.O.   On: 10/27/2019 12:15   CT Head Wo Contrast  Result Date: 10/27/2019 CLINICAL DATA:  86 year old female status post fall last night at 1930 hours. Pain. EXAM: CT HEAD WITHOUT CONTRAST TECHNIQUE: Contiguous axial images were obtained from the base of the skull through the vertex without intravenous contrast. COMPARISON:  Brain MRI 04/07/2012.  Head CT 01/20/2009. FINDINGS: Brain: Cerebral volume has not significantly changed and remains within normal limits. No midline shift, ventriculomegaly, mass effect, evidence of mass lesion, intracranial hemorrhage or evidence of cortically based acute infarction. Chronic perivascular space at the right inferior basal ganglia (normal variant). Normal for age gray-white matter differentiation throughout the brain.  Vascular: Calcified atherosclerosis at the skull base. Skull: No acute osseous abnormality identified. Sinuses/Orbits: Visualized paranasal sinuses and mastoids are stable and well pneumatized. Other: No acute orbit or scalp soft tissue finding. IMPRESSION: No acute intracranial abnormality or acute traumatic injury identified. Normal for age non contrast CT appearance of the brain. Electronically Signed   By: Genevie [name] M.D.   On: 10/27/2019 11:55   DG Chest Port 1 View  Result Date: 10/27/2019 CLINICAL DATA:  Fracture. Status post fall. Patient reports weakness. EXAM: PORTABLE CHEST 1 VIEW COMPARISON:  Chest CT 07/17/2019, chest radiograph 08/07/2017 FINDINGS: Unchanged cardiomegaly. Aortic atherosclerosis. Redemonstrated findings consistent with known chronic interstitial lung disease most notable within the upper to mid lung fields. New from prior exams, there is superimposed more confluent airspace consolidation within the left  upper lobe and left lung base which is nonspecific, but suspicious for superimposed pneumonia. A small left pleural effusion may be present. No evidence of pneumothorax. Chronic fracture deformity of the proximal left humerus. No acute bony abnormality is identified. IMPRESSION: Redemonstrated findings consistent with known chronic interstitial lung disease, most notably within the upper to mid lungs. New from prior exams, there is superimposed confluent airspace consolidation within the left upper lobe and left lung base which is nonspecific, but suspicious for superimposed pneumonia. Radiographic follow-up is recommended. A small left pleural effusion may be present. Unchanged cardiomegaly. Aortic Atherosclerosis (ICD10-I70.0). Electronically Signed   By: Kellie Simmering DO   On: 10/27/2019 13:15    ROS Blood pressure (!) 92/56, pulse 99, temperature 98.2 F (36.8 C), temperature source Oral, resp. rate 18, height 5\' 6"  (1.676 m), weight 87.1 kg, SpO2 91 %. Physical Exam  Constitutional: She is oriented to person, place, and time. She appears well-developed and well-nourished.  On oxygen. Alert conversant  HENT:  Head: Normocephalic and atraumatic.  Eyes: Pupils are equal, round, and reactive to light. Conjunctivae are normal.  Neck: No tracheal deviation present. No thyromegaly present.  Cardiovascular:  IRR  Respiratory: No respiratory distress. She has no wheezes.  GI: Soft. She exhibits no distension. There is no abdominal tenderness.  Musculoskeletal:     Cervical back: Normal range of motion.     Comments: Shortening right lower extremity.  Normal range of motion left hip left knee.  Sensation the foot is intact on the right.  She is short right lower extremity pain with hip range of motion.  Neurological: She is alert and oriented to person, place, and time.  Skin: No rash noted. No erythema.  Psychiatric: She has a normal mood and affect. Her behavior is normal. Judgment and thought  content normal.    Assessment/Plan: Patient's INR is 1.6 which is acceptable.  Plan right hip bipolar hemiarthroplasty from a posterior approach so she should be able to immediately weight-bear after the surgery.  I discussed procedure risks of surgery with patient and son.  Risk factors include her heart failure, atrial fibrillation, COPD with chronic oxygen use.  Plan restarting her Coumadin after the surgery without loading to avoid significant postoperative blood loss which could be a problem with her heart failure.  Questions were elicited and answered they understand agree to proceed.  Marybelle Killings 10/28/2019, 8:06 AM

## 2019-10-28 NOTE — Plan of Care (Signed)
  Problem: Pain Management: Goal: Pain level will decrease Outcome: Progressing   

## 2019-10-28 NOTE — Anesthesia Preprocedure Evaluation (Addendum)
Anesthesia Evaluation  Patient identified by MRN, date of birth, ID band Patient awake    Reviewed: Allergy & Precautions, NPO status , Patient's Chart, lab work & pertinent test results  Airway Mallampati: II  TM Distance: >3 FB Neck ROM: Full    Dental no notable dental hx. (+) Teeth Intact, Dental Advisory Given   Pulmonary COPD,    Pulmonary exam normal breath sounds clear to auscultation       Cardiovascular hypertension, Pt. on medications + CAD and +CHF  Normal cardiovascular exam Rhythm:Regular Rate:Normal  ECHO 21 FINDINGS  Left Ventricle: Left ventricular ejection fraction, by estimation, is 60  to 65%. The left ventricle has normal function. The left ventricle has no  regional wall motion abnormalities. The left ventricular internal cavity  size was normal in size. There is  no left ventricular hypertrophy.   Right Ventricle: The right ventricular size is moderately enlarged. No  increase in right ventricular wall thickness. Right ventricular systolic  function is moderately reduced. There is moderately elevated pulmonary  artery systolic pressure. The tricuspid  regurgitant velocity is 3.37 m/s, and with an assumed right atrial  pressure of 15 mmHg, the estimated right ventricular systolic pressure is  [id number] mmHg.    Neuro/Psych PSYCHIATRIC DISORDERS Anxiety Depression negative neurological ROS     GI/Hepatic Neg liver ROS, GERD  ,  Endo/Other  Hypothyroidism   Renal/GU negative Renal ROS  negative genitourinary   Musculoskeletal negative musculoskeletal ROS (+)   Abdominal   Peds negative pediatric ROS (+)  Hematology negative hematology ROS (+) anemia ,   Anesthesia Other Findings   Reproductive/Obstetrics negative OB ROS                           Anesthesia Physical Anesthesia Plan  ASA: III  Anesthesia Plan: General   Post-op Pain Management:    Induction:    PONV Risk Score and Plan: 2  Airway Management Planned: Oral ETT and LMA  Additional Equipment:   Intra-op Plan:   Post-operative Plan:   Informed Consent:   Plan Discussed with: Anesthesiologist, CRNA and Surgeon  Anesthesia Plan Comments: (  )       Anesthesia Quick Evaluation

## 2019-10-28 NOTE — Anesthesia Postprocedure Evaluation (Signed)
Anesthesia Post Note  Patient: Stephanie Cortez  Procedure(s) Performed: ARTHROPLASTY BIPOLAR HIP (HEMIARTHROPLASTY) (Right Hip)     Patient location during evaluation: PACU Anesthesia Type: General Level of consciousness: awake and alert Pain management: pain level controlled Vital Signs Assessment: post-procedure vital signs reviewed and stable Respiratory status: spontaneous breathing, nonlabored ventilation, respiratory function stable and patient connected to nasal cannula oxygen Cardiovascular status: blood pressure returned to baseline and stable Postop Assessment: no apparent nausea or vomiting Anesthetic complications: no    Last Vitals:  Vitals:   10/28/19 2044 10/28/19 2057  BP:  114/62  Pulse:  64  Resp:  16  Temp:  (!) 36.4 C  SpO2: 93% 95%    Last Pain:  Vitals:   10/28/19 2057  TempSrc: Oral  PainSc:                  Cherri Yera S

## 2019-10-28 NOTE — Interval H&P Note (Signed)
History and Physical Interval Note:  10/28/2019 3:03 PM  Stephanie Cortez  has presented today for surgery, with the diagnosis of RIGHT FEMORAL NECK FRACTURE.  The various methods of treatment have been discussed with the patient and family. After consideration of risks, benefits and other options for treatment, the patient has consented to  Procedure(s): ARTHROPLASTY BIPOLAR HIP (HEMIARTHROPLASTY) (Right) as a surgical intervention.  The patient's history has been reviewed, patient examined, no change in status, stable for surgery.  I have reviewed the patient's chart and labs.  Questions were answered to the patient's satisfaction.     Stephanie Cortez

## 2019-10-28 NOTE — Progress Notes (Signed)
  Echocardiogram 2D Echocardiogram has been performed.  Stephanie Cortez 10/28/2019, 10:12 AM

## 2019-10-28 NOTE — Progress Notes (Signed)
D; O2 sat dropped to 84% with [place] 3 l/min. IS to encouraged to deep breath, , remains O2 sat 88~91% with 4L/min via [place].   anesthesiology aware , ordered cont. O2 sat monitoring tonight.

## 2019-10-28 NOTE — Anesthesia Procedure Notes (Signed)
Procedure Name: Intubation Date/Time: 10/28/2019 3:15 PM Performed by: Niel Hummer, CRNA Pre-anesthesia Checklist: Patient identified, Emergency Drugs available, Suction available and Patient being monitored Patient Re-evaluated:Patient Re-evaluated prior to induction Oxygen Delivery Method: Circle system utilized Preoxygenation: Pre-oxygenation with 100% oxygen Induction Type: IV induction Ventilation: Mask ventilation without difficulty Laryngoscope Size: Mac and 4 Grade View: Grade I Tube type: Oral Tube size: 7.0 mm Number of attempts: 1 Airway Equipment and Method: Stylet Placement Confirmation: ETT inserted through vocal cords under direct vision,  positive ETCO2 and breath sounds checked- equal and bilateral Secured at: 22 cm Tube secured with: Tape Dental Injury: Teeth and Oropharynx as per pre-operative assessment

## 2019-10-28 NOTE — Op Note (Signed)
Preop diagnosis: Right femoral neck fracture, closed.  Postop diagnosis: Same  Procedure: Right hip hemiarthroplasty for femoral neck fracture.  Surgeon: Rodell Perna, MD  Anesthesia: General orotracheal +30 cc Marcaine local.  EBL: 100 cc  Implants:Depuy Actis size 6 femoral stem.  52 mm bipolar snap-on with 1.5 standard neck length.  Complications: None.  Procedure after induction of general anesthesia orotracheal ovation patient placed in lateral position with marked 7 frame axillary roll.  DuraPrep from mid calf up to the waist.  Usual total hip sheets drapes impervious stockinette Coban sterile skin marker and Betadine Steri-Drape x2 was used to seal the skin.  Timeout procedure was completed Ancef was given prophylactically.  Posterior approach was made Charnley retractor was used.  Piriformis was tagged and cut and was repaired at the end of the case back to the gluteus medius.  Capsule was opened femoral neck was fracture neck was cut 12 mm above the lesser trochanter and there was some comminution of the fracture pieces of bone were removed and some small pieces were still in excellent joint which was removed out of the acetabulum.  Sizing showed 50 to size gave excellent suction fit.  Sequential preparation of the femur with cookie cutter lateralizer and then progressing up to #6 with trials.  6 permanent stem was inserted.  50 mm ball was assembled popped on impacted checked and then protecting the sciatic nerve using the bone at the hip was reduced with excellent stability flexion 90 internal rotation 80 degrees without subluxation of the hip.  Copious irrigation.  Repair the piriformis and gluteus medius.  #1 Vicryl in the tensor fascia and gluteus maximus fascia reapproximation.  2-0 Vicryl subtendinous tissue skin staple closure Marcaine infiltration 30 cc and postop dressing.  Postop dressing with Xeroform 4 x 4's ABD taped and knee immobilizer was applied.

## 2019-10-28 NOTE — Transfer of Care (Signed)
Immediate Anesthesia Transfer of Care Note  Patient: Stephanie Cortez  Procedure(s) Performed: ARTHROPLASTY BIPOLAR HIP (HEMIARTHROPLASTY) (Right Hip)  Patient Location: PACU  Anesthesia Type:General  Level of Consciousness: drowsy and responds to stimulation  Airway & Oxygen Therapy: Patient Spontanous Breathing and Patient connected to face mask oxygen  Post-op Assessment: Report given to RN and Post -op Vital signs reviewed and stable  Post vital signs: Reviewed and stable  Last Vitals:  Vitals Value Taken Time  BP 110/98 10/28/19 1641  Temp 37.4 C 10/28/19 1638  Pulse 70 10/28/19 1644  Resp 18 10/28/19 1644  SpO2 91 % 10/28/19 1644  Vitals shown include unvalidated device data.  Last Pain:  Vitals:   10/28/19 1443  TempSrc: Oral  PainSc:       Patients Stated Pain Goal: 2 ([id number])  Complications: No apparent anesthesia complications

## 2019-10-28 NOTE — Progress Notes (Signed)
ANTICOAGULATION CONSULT NOTE - Initial Consult  Pharmacy Consult for warfarin Indication: atrial fibrillation  Allergies  Allergen Reactions  . Doxycycline     Rash on face and neck  . Flagyl [Metronidazole] Nausea And Vomiting  . Moxifloxacin Other (See Comments)     Weakness/fatigue  . Pantoprazole Sodium Rash  . Penicillins Rash    Has patient had a PCN reaction causing immediate rash, facial/tongue/throat swelling, SOB or lightheadedness with hypotension:unsure Has patient had a PCN reaction causing severe rash involving mucus membranes or skin necrosis:unsure Has patient had a PCN reaction that required hospitalization:No Has patient had a PCN reaction occurring within the last 10 years:Yes Cannot recall exact reaction due to time lapse If all of the above answers are "NO", then may proceed with Cephalosporin use.     Patient Measurements: Height: 5\' 6"  (167.6 cm) Weight: 87.1 kg (192 lb 0.3 oz)(pulled from 4/12 weight) IBW/kg (Calculated) : 59.3   Vital Signs: Temp: 99.4 F (37.4 C) (04/28 1638) Temp Source: Oral (04/28 1443) BP: 115/66 (04/28 1645) Pulse Rate: 58 (04/28 1645)  Labs: Recent Labs    10/27/19 1159 10/27/19 1200 10/28/19 0250  HGB  --  13.1 11.7*  HCT  --  41.7 37.4  PLT  --  253 206  LABPROT 17.9*  --  18.6*  INR 1.5*  --  1.6*  CREATININE  --  0.54 0.66    Estimated Creatinine Clearance: 58.2 mL/min (by C-G formula based on SCr of 0.66 mg/dL).   Medical History: Past Medical History:  Diagnosis Date  . Anemia    - Hgb 9.7gm% on 07/13/2008 in [place] -  Hgg 129gm% wiht normal irone levsl and ferritin 10/27/2008 in [place] Recurrent otitis/sinusitis  . Anxiety    chronic BZ prn  . Atrial fibrillation (HCC)    chronic anticoag  . Bronchiectasis    >PFT 07/13/2008 in [place] 1.9L/76%, FVC 2.45L/74, Ratio 79, TLC 121%, DLCO 64%  AE BRonchiectasis - Dec 2010.New Rx:  outpatient - Feb 2011 - Rx outpatient  . CHF (congestive heart failure)  ([place])   . COPD (chronic obstructive pulmonary disease) (HCC)    bronchiectasis  . Cricopharyngeal achalasia   . Depression   . Diverticulosis   . Dyslipidemia   . Eczema   . GERD with stricture   . Glaucoma   . H. pylori infection   . Hypertension   . Hyponatremia    chronic, s/p endo eval 06/2012  . Hypothyroid   . IBS (irritable bowel syndrome)   . Lumbar disc disease   . Neuropathy of both feet   . Segmental colitis ([place])   . Vertigo   . Wears glasses     Assessment: 86 y.o. female with medical history significant of A. fib on Coumadin, chronic diastolic heart failure, hypertension, probable ILD, hypothyroidism, hyperlipidemia, depression presented with a fall. Pt now p/o right hip bipolar hemiarthroplasty pharmacy consulted to resume warfarin.  Home dose 1.25mg  on M,W, F and 2.5mg  all other days.  LD 4/26 @ 2200  10/28/2019 INR 1.6 Hgb 11.7 Scr 0.66   Goal of Therapy:  INR 2-3   Plan:  Warfarin 2.5mg  po x 1 tonight Daily INR   Dolly Rias RPh 10/28/2019, 5:00 PM

## 2019-10-28 NOTE — Consult Note (Signed)
Reason for Consult:fall with right femoral neck fracture, closed.  Referring Physician: Lodema Hong MD  Stephanie Cortez is an 86 y.o. female.  HPI: 86 year old female with portable oxygen use household and occasional community ambulator tripped over her dog with a fall injuring her right hip.  Patient's son is present to help with history.  Patient has positive history of atrial fib on chronic Coumadin.  INR at last 24 hours has been 1.5 and 1.6.  Patient has history of hyperlipidemia hypertension and chronic diastolic heart failure.  Patient lives alone.  History of bronchiectasis, COPD diverticulitis and GERD.  No history of hip osteoarthritis.  Past Medical History:  Diagnosis Date  . Anemia    - Hgb 9.7gm% on 07/13/2008 in [place] -  Hgg 129gm% wiht normal irone levsl and ferritin 10/27/2008 in [place] Recurrent otitis/sinusitis  . Anxiety    chronic BZ prn  . Atrial fibrillation (HCC)    chronic anticoag  . Bronchiectasis    >PFT 07/13/2008 in [place] 1.9L/76%, FVC 2.45L/74, Ratio 79, TLC 121%, DLCO 64%  AE BRonchiectasis - Dec 2010.New Rx:  outpatient - Feb 2011 - Rx outpatient  . CHF (congestive heart failure) ([place])   . COPD (chronic obstructive pulmonary disease) (HCC)    bronchiectasis  . Cricopharyngeal achalasia   . Depression   . Diverticulosis   . Dyslipidemia   . Eczema   . GERD with stricture   . Glaucoma   . H. pylori infection   . Hypertension   . Hyponatremia    chronic, s/p endo eval 06/2012  . Hypothyroid   . IBS (irritable bowel syndrome)   . Lumbar disc disease   . Neuropathy of both feet   . Segmental colitis ([place])   . Vertigo   . Wears glasses     Past Surgical History:  Procedure Laterality Date  . BREAST SURGERY     br bx  . CARDIAC CATHETERIZATION  07/01/2013  . CATARACT EXTRACTION     both  . COLONOSCOPY    . ESOPHAGOSCOPY W/ BOTOX INJECTION  12/11/2011   Procedure: ESOPHAGOSCOPY WITH BOTOX INJECTION;  Surgeon: Rozetta Nunnery, MD;   Location: [facility];  Service: ENT;  Laterality: N/A;  esophogoscopy with dilation, botox injection  . FOOT SURGERY  03/14/2011   gastroc slide-rt  . TOTAL ABDOMINAL HYSTERECTOMY    . WISDOM TOOTH EXTRACTION      Family History  Problem Relation Age of Onset  . Hypertension Mother   . Stroke Mother   . Emphysema Brother   . Other Father        miner's lung  . Cancer Father        Lung  . Colon polyps Sister   . Pancreatic cancer Sister   . Kidney disease Sister   . Atrial fibrillation Other        siblings    Social History:  reports that she has never smoked. She has never used smokeless tobacco. She reports that she does not drink alcohol or use drugs.  Allergies:  Allergies  Allergen Reactions  . Doxycycline     Rash on face and neck  . Flagyl [Metronidazole] Nausea And Vomiting  . Moxifloxacin Other (See Comments)     Weakness/fatigue  . Pantoprazole Sodium Rash  . Penicillins Rash    Has patient had a PCN reaction causing immediate rash, facial/tongue/throat swelling, SOB or lightheadedness with hypotension:unsure Has patient had a PCN reaction causing severe rash  involving mucus membranes or skin necrosis:unsure Has patient had a PCN reaction that required hospitalization:No Has patient had a PCN reaction occurring within the last 10 years:Yes Cannot recall exact reaction due to time lapse If all of the above answers are "NO", then may proceed with Cephalosporin use.     Medications: I have reviewed the patient's current medications.  Results for orders placed or performed during the hospital encounter of 10/27/19 (from the past 48 hour(s))  Protime-INR     Status: Abnormal   Collection Time: 10/27/19 11:59 AM  Result Value Ref Range   Prothrombin Time 17.9 (H) 11.4 - 15.2 seconds   INR 1.5 (H) 0.8 - 1.2    Comment: (NOTE) INR goal varies based on device and disease states. Performed at [facility], [place] [address] [place], [place] [address]   Comprehensive metabolic panel     Status: Abnormal   Collection Time: 10/27/19 12:00 PM  Result Value Ref Range   Sodium 132 (L) 135 - 145 mmol/L   Potassium 4.4 3.5 - 5.1 mmol/L   Chloride 99 98 - 111 mmol/L   CO2 21 (L) 22 - 32 mmol/L   Glucose, Bld 146 (H) 70 - 99 mg/dL    Comment: Glucose reference range applies only to samples taken after fasting for at least 8 hours.   BUN 12 8 - 23 mg/dL   Creatinine, Ser 0.54 0.44 - 1.00 mg/dL   Calcium 8.8 (L) 8.9 - 10.3 mg/dL   Total Protein 6.6 6.5 - 8.1 g/dL   Albumin 3.4 (L) 3.5 - 5.0 g/dL   AST 30 15 - 41 U/L   ALT 18 0 - 44 U/L   Alkaline Phosphatase 100 38 - 126 U/L   Total Bilirubin 1.0 0.3 - 1.2 mg/dL   GFR calc non Af Amer >60 >60 mL/min   GFR calc Af Amer >60 >60 mL/min   Anion gap 12 5 - 15    Comment: Performed at [facility], [place] [address], [place], [place] [address]  CBC with Differential     Status: Abnormal   Collection Time: 10/27/19 12:00 PM  Result Value Ref Range   WBC 15.6 (H) 4.0 - 10.5 K/uL   RBC 4.31 3.87 - 5.11 MIL/uL   Hemoglobin 13.1 12.0 - 15.0 g/dL   HCT 41.7 36.0 - 46.0 %   MCV 96.8 80.0 - 100.0 fL   MCH 30.4 26.0 - 34.0 pg   MCHC 31.4 30.0 - 36.0 g/dL   RDW 13.6 11.5 - 15.5 %   Platelets 253 150 - 400 K/uL   nRBC 0.0 0.0 - 0.2 %   Neutrophils Relative % 90 %   Neutro Abs 14.2 (H) 1.7 - 7.7 K/uL   Lymphocytes Relative 4 %   Lymphs Abs 0.5 (L) 0.7 - 4.0 K/uL   Monocytes Relative 5 %   Monocytes Absolute 0.7 0.1 - 1.0 K/uL   Eosinophils Relative 0 %   Eosinophils Absolute 0.0 0.0 - 0.5 K/uL   Basophils Relative 0 %   Basophils Absolute 0.1 0.0 - 0.1 K/uL   Immature Granulocytes 1 %   Abs Immature Granulocytes 0.09 (H) 0.00 - 0.07 K/uL    Comment: Performed at [facility], [place] [address], [place],  [address]  Respiratory Panel by RT PCR (Flu A&B, Covid) - Nasopharyngeal Swab     Status: None   Collection Time: 10/27/19   1:12 PM   Specimen: Nasopharyngeal Swab  Result Value Ref Range   SARS Coronavirus 2 by RT PCR NEGATIVE NEGATIVE    Comment: (NOTE) SARS-CoV-2 target nucleic acids are NOT DETECTED. The SARS-CoV-2 RNA is generally detectable in upper respiratoy specimens during the acute phase of infection. The lowest concentration of SARS-CoV-2 viral copies this assay can detect is 131 copies/mL. A negative result does not preclude SARS-Cov-2 infection and should not be used as the sole basis for treatment or other patient management decisions. A negative result may occur with  improper specimen collection/handling, submission of specimen other than nasopharyngeal swab, presence of viral mutation(s) within the areas targeted by this assay, and inadequate number of viral copies (<131 copies/mL). A negative result must be combined with clinical observations, patient history, and epidemiological information. The expected result is Negative. Fact Sheet for Patients:  PinkCheek.be Fact Sheet for Healthcare Providers:  GravelBags.it This test is not yet ap proved or cleared by the [place] FDA and  has been authorized for detection and/or diagnosis of SARS-CoV-2 by FDA under an Emergency Use Authorization (EUA). This EUA will remain  in effect (meaning this test can be used) for the duration of the COVID-19 declaration under Section 564(b)(1) of the Act, 21 U.S.C. section 360bbb-3(b)(1), unless the authorization is terminated or revoked sooner.    Influenza A by PCR NEGATIVE NEGATIVE   Influenza B by PCR NEGATIVE NEGATIVE    Comment: (NOTE) The Xpert Xpress SARS-CoV-2/FLU/RSV assay is intended as an aid in  the diagnosis of influenza from Nasopharyngeal swab specimens and  should not be used as a sole basis for treatment. Nasal washings and  aspirates are unacceptable for Xpert Xpress SARS-CoV-2/FLU/RSV  testing. Fact Sheet for  Patients: PinkCheek.be Fact Sheet for Healthcare Providers: GravelBags.it This test is not yet approved or cleared by the [place] FDA and  has been authorized for detection and/or diagnosis of SARS-CoV-2 by  FDA under an Emergency Use Authorization (EUA). This EUA will remain  in effect (meaning this test can be used) for the duration of the  Covid-19 declaration under Section 564(b)(1) of the Act, 21  U.S.C. section 360bbb-3(b)(1), unless the authorization is  terminated or revoked. Performed at [facility], [place] [address] [place], [place] [address]   CBC     Status: Abnormal   Collection Time: 10/28/19  2:50 AM  Result Value Ref Range   WBC 13.8 (H) 4.0 - 10.5 K/uL   RBC 3.84 (L) 3.87 - 5.11 MIL/uL   Hemoglobin 11.7 (L) 12.0 - 15.0 g/dL   HCT 37.4 36.0 - 46.0 %   MCV 97.4 80.0 - 100.0 fL   MCH 30.5 26.0 - 34.0 pg   MCHC 31.3 30.0 - 36.0 g/dL   RDW 13.7 11.5 - 15.5 %   Platelets 206 150 - 400 K/uL   nRBC 0.0 0.0 - 0.2 %    Comment: Performed at [facility], [place] [address], [place], [place] [address]  Protime-INR     Status: Abnormal   Collection Time: 10/28/19  2:50 AM  Result Value Ref Range   Prothrombin Time 18.6 (H) 11.4 - 15.2 seconds   INR 1.6 (H) 0.8 - 1.2    Comment: (NOTE) INR goal varies based on device and disease states. Performed at [facility], [place] [address] [place], [place] [id number]   Basic metabolic panel     Status: Abnormal   Collection Time: 10/28/19  2:50 AM  Result Value Ref Range   Sodium 129 (  L) 135 - 145 mmol/L   Potassium 4.7 3.5 - 5.1 mmol/L   Chloride 97 (L) 98 - 111 mmol/L   CO2 22 22 - 32 mmol/L   Glucose, Bld 117 (H) 70 - 99 mg/dL    Comment: Glucose reference range applies only to samples taken after fasting for at least 8 hours.   BUN 13 8 - 23 mg/dL   Creatinine, Ser 0.66 0.44 - 1.00 mg/dL   Calcium 8.5 (L)  8.9 - 10.3 mg/dL   GFR calc non Af Amer >60 >60 mL/min   GFR calc Af Amer >60 >60 mL/min   Anion gap 10 5 - 15    Comment: Performed at [facility], [place] [address] [place], [place] [address]  Surgical PCR screen     Status: None   Collection Time: 10/28/19  3:00 AM   Specimen: Nasal Mucosa; Nasal Swab  Result Value Ref Range   MRSA, PCR NEGATIVE NEGATIVE   Staphylococcus aureus NEGATIVE NEGATIVE    Comment: (NOTE) The Xpert SA Assay (FDA approved for NASAL specimens in patients 90 years of age and older), is one component of a comprehensive surveillance program. It is not intended to diagnose infection nor to guide or monitor treatment. Performed at [facility], [place] [address], [place], [place] [address]   Urinalysis, Routine w reflex microscopic     Status: Abnormal   Collection Time: 10/28/19  6:08 AM  Result Value Ref Range   Color, Urine AMBER (A) YELLOW    Comment: BIOCHEMICALS MAY BE AFFECTED BY COLOR   APPearance HAZY (A) CLEAR   Specific Gravity, Urine 1.023 1.005 - 1.030   pH 6.0 5.0 - 8.0   Glucose, UA NEGATIVE NEGATIVE mg/dL   Hgb urine dipstick MODERATE (A) NEGATIVE   Bilirubin Urine NEGATIVE NEGATIVE   Ketones, ur NEGATIVE NEGATIVE mg/dL   Protein, ur >=300 (A) NEGATIVE mg/dL   Nitrite NEGATIVE NEGATIVE   Leukocytes,Ua SMALL (A) NEGATIVE   RBC / HPF 21-50 0 - 5 RBC/hpf   WBC, UA >50 (H) 0 - 5 WBC/hpf   Bacteria, UA NONE SEEN NONE SEEN   Squamous Epithelial / LPF 0-5 0 - 5    Comment: Performed at [facility], [place] [address], [place], [place] [address]    DG Pelvis 1-2 Views  Result Date: 10/27/2019 CLINICAL DATA:  Fall, right hip pain EXAM: PELVIS - 1-2 VIEW COMPARISON:  None. FINDINGS: Acute, mildly impacted subcapital fracture of the proximal right femur. No significant displacement or angulation evident on AP view. Moderate osteoarthritis of the hips, right worse than left. No dislocation. SI  joints are intact mild-to-moderate osteoarthritis. No SI joint or pubic symphysis diastasis. IMPRESSION: Acute, mildly impacted subcapital fracture of the proximal right femur. With Electronically Signed   By: Davina Poke D.O.   On: 10/27/2019 12:15   CT Head Wo Contrast  Result Date: 10/27/2019 CLINICAL DATA:  86 year old female status post fall last night at 1930 hours. Pain. EXAM: CT HEAD WITHOUT CONTRAST TECHNIQUE: Contiguous axial images were obtained from the base of the skull through the vertex without intravenous contrast. COMPARISON:  Brain MRI 04/07/2012.  Head CT 01/20/2009. FINDINGS: Brain: Cerebral volume has not significantly changed and remains within normal limits. No midline shift, ventriculomegaly, mass effect, evidence of mass lesion, intracranial hemorrhage or evidence of cortically based acute infarction. Chronic perivascular space at the right inferior basal ganglia (normal variant). Normal for age gray-white matter differentiation throughout the brain.  Vascular: Calcified atherosclerosis at the skull base. Skull: No acute osseous abnormality identified. Sinuses/Orbits: Visualized paranasal sinuses and mastoids are stable and well pneumatized. Other: No acute orbit or scalp soft tissue finding. IMPRESSION: No acute intracranial abnormality or acute traumatic injury identified. Normal for age non contrast CT appearance of the brain. Electronically Signed   By: Genevie [name] M.D.   On: 10/27/2019 11:55   DG Chest Port 1 View  Result Date: 10/27/2019 CLINICAL DATA:  Fracture. Status post fall. Patient reports weakness. EXAM: PORTABLE CHEST 1 VIEW COMPARISON:  Chest CT 07/17/2019, chest radiograph 08/07/2017 FINDINGS: Unchanged cardiomegaly. Aortic atherosclerosis. Redemonstrated findings consistent with known chronic interstitial lung disease most notable within the upper to mid lung fields. New from prior exams, there is superimposed more confluent airspace consolidation within the left  upper lobe and left lung base which is nonspecific, but suspicious for superimposed pneumonia. A small left pleural effusion may be present. No evidence of pneumothorax. Chronic fracture deformity of the proximal left humerus. No acute bony abnormality is identified. IMPRESSION: Redemonstrated findings consistent with known chronic interstitial lung disease, most notably within the upper to mid lungs. New from prior exams, there is superimposed confluent airspace consolidation within the left upper lobe and left lung base which is nonspecific, but suspicious for superimposed pneumonia. Radiographic follow-up is recommended. A small left pleural effusion may be present. Unchanged cardiomegaly. Aortic Atherosclerosis (ICD10-I70.0). Electronically Signed   By: Kellie Simmering DO   On: 10/27/2019 13:15    ROS Blood pressure (!) 92/56, pulse 99, temperature 98.2 F (36.8 C), temperature source Oral, resp. rate 18, height 5\' 6"  (1.676 m), weight 87.1 kg, SpO2 91 %. Physical Exam  Constitutional: She is oriented to person, place, and time. She appears well-developed and well-nourished.  On oxygen. Alert conversant  HENT:  Head: Normocephalic and atraumatic.  Eyes: Pupils are equal, round, and reactive to light. Conjunctivae are normal.  Neck: No tracheal deviation present. No thyromegaly present.  Cardiovascular:  IRR  Respiratory: No respiratory distress. She has no wheezes.  GI: Soft. She exhibits no distension. There is no abdominal tenderness.  Musculoskeletal:     Cervical back: Normal range of motion.     Comments: Shortening right lower extremity.  Normal range of motion left hip left knee.  Sensation the foot is intact on the right.  She is short right lower extremity pain with hip range of motion.  Neurological: She is alert and oriented to person, place, and time.  Skin: No rash noted. No erythema.  Psychiatric: She has a normal mood and affect. Her behavior is normal. Judgment and thought  content normal.    Assessment/Plan: Patient's INR is 1.6 which is acceptable.  Plan right hip bipolar hemiarthroplasty from a posterior approach so she should be able to immediately weight-bear after the surgery.  I discussed procedure risks of surgery with patient and son.  Risk factors include her heart failure, atrial fibrillation, COPD with chronic oxygen use.  Plan restarting her Coumadin after the surgery without loading to avoid significant postoperative blood loss which could be a problem with her heart failure.  Questions were elicited and answered they understand agree to proceed.  Marybelle Killings 10/28/2019, 8:06 AM

## 2019-10-29 ENCOUNTER — Other Ambulatory Visit: Payer: Self-pay

## 2019-10-29 ENCOUNTER — Inpatient Hospital Stay (HOSPITAL_COMMUNITY): Payer: Medicare Other

## 2019-10-29 ENCOUNTER — Encounter: Payer: Self-pay | Admitting: *Deleted

## 2019-10-29 ENCOUNTER — Telehealth: Payer: Self-pay | Admitting: Internal Medicine

## 2019-10-29 DIAGNOSIS — J9621 Acute and chronic respiratory failure with hypoxia: Secondary | ICD-10-CM

## 2019-10-29 LAB — CBC
HCT: 35.7 % — ABNORMAL LOW (ref 36.0–46.0)
Hemoglobin: 10.7 g/dL — ABNORMAL LOW (ref 12.0–15.0)
MCH: 30.1 pg (ref 26.0–34.0)
MCHC: 30 g/dL (ref 30.0–36.0)
MCV: 100.3 fL — ABNORMAL HIGH (ref 80.0–100.0)
Platelets: 171 10*3/uL (ref 150–400)
RBC: 3.56 MIL/uL — ABNORMAL LOW (ref 3.87–5.11)
RDW: 13.7 % (ref 11.5–15.5)
WBC: 9.7 10*3/uL (ref 4.0–10.5)
nRBC: 0 % (ref 0.0–0.2)

## 2019-10-29 LAB — URINE CULTURE

## 2019-10-29 LAB — PROTIME-INR
INR: 1.6 — ABNORMAL HIGH (ref 0.8–1.2)
Prothrombin Time: 18.9 seconds — ABNORMAL HIGH (ref 11.4–15.2)

## 2019-10-29 LAB — BASIC METABOLIC PANEL
Anion gap: 8 (ref 5–15)
BUN: 20 mg/dL (ref 8–23)
CO2: 22 mmol/L (ref 22–32)
Calcium: 8.3 mg/dL — ABNORMAL LOW (ref 8.9–10.3)
Chloride: 97 mmol/L — ABNORMAL LOW (ref 98–111)
Creatinine, Ser: 0.69 mg/dL (ref 0.44–1.00)
GFR calc Af Amer: >60 mL/min (ref >60)
GFR calc non Af Amer: >60 mL/min (ref >60)
Glucose, Bld: 146 mg/dL — ABNORMAL HIGH (ref 70–99)
Potassium: 4.4 mmol/L (ref 3.5–5.1)
Sodium: 127 mmol/L — ABNORMAL LOW (ref 135–145)

## 2019-10-29 MED ORDER — FUROSEMIDE 10 MG/ML IJ SOLN
40.0000 mg | Freq: Once | INTRAMUSCULAR | Status: AC
  Administered 2019-10-29: 40 mg via INTRAVENOUS
  Filled 2019-10-29: qty 4

## 2019-10-29 MED ORDER — WARFARIN SODIUM 2.5 MG PO TABS
2.5000 mg | ORAL_TABLET | Freq: Once | ORAL | Status: AC
  Administered 2019-10-29: 2.5 mg via ORAL
  Filled 2019-10-29: qty 1

## 2019-10-29 MED ORDER — HYDROCODONE-ACETAMINOPHEN 5-325 MG PO TABS: 1.0000 | ORAL_TABLET | Freq: Four times a day (QID) | ORAL | 0 refills | Status: DC | PRN

## 2019-10-29 NOTE — Evaluation (Signed)
Occupational Therapy Evaluation Patient Details Name: Stephanie Cortez MRN: [id number] DOB: 17-Jun-1934 Today's Date: 10/29/2019    History of Present Illness Patient is a 86 year old female admitted after a fall with closed femoral neck fracture. Pt is s/p R hemiarthoplasty.     Clinical Impression   Patient with functional deficits listed below impacting safety and independence with self care. Educated patient on hip precautions prior to mobility and initiated education regarding necessary AE for self care due to precautions. Pt require mod/max A with bed mobility with max cues for sequencing. Prolonged rest break in sitting due to patient feeling dizzy, with time improved with BP 111/60. Patient require min/mod A x2 to stand from elevated bed height and max cues for hand placement/body mechanics. Patient able to take a few steps with initially with mod A x2, progressed to min A x2 and max verbal/tactile cues for sequencing and transfer into bedside chair. Will continue to follow.    Follow Up Recommendations  SNF    Equipment Recommendations  Other (comment)(defer to next venue)       Precautions / Restrictions Precautions Precautions: Posterior Hip Precaution Booklet Issued: Yes (comment) Restrictions Weight Bearing Restrictions: Yes RLE Weight Bearing: Weight bearing as tolerated      Mobility Bed Mobility Overal bed mobility: Needs Assistance Bed Mobility: Supine to Sit     Supine to sit: Mod assist;Max assist;HOB elevated     General bed mobility comments: pt requires support of R LE while mobilizing towards EOB and mod A for trunk elevation   Transfers Overall transfer level: Needs assistance Equipment used: Rolling walker (2 wheeled) Transfers: Sit to/from Stand Sit to Stand: Min assist;Mod assist;+2 physical assistance;+2 safety/equipment;From elevated surface         General transfer comment: max cues for sequencing, proper hand placement and body mechanics    Balance Overall balance assessment: Needs assistance Sitting-balance support: No upper extremity supported;Feet supported Sitting balance-Leahy Scale: Good     Standing balance support: Bilateral upper extremity supported;During functional activity Standing balance-Leahy Scale: Poor Standing balance comment: reliant on external support                            ADL either performed or assessed with clinical judgement   ADL Overall ADL's : Needs assistance/impaired     Grooming: Set up;Sitting   Upper Body Bathing: Sitting;Set up   Lower Body Bathing: Maximal assistance;Sitting/lateral leans;Sit to/from stand   Upper Body Dressing : Set up;Sitting   Lower Body Dressing: Maximal assistance;Total assistance;Sitting/lateral leans;Sit to/from stand Lower Body Dressing Details (indicate cue type and reason): total A don socks due to precautions, initiated education regarding LB AE  Toilet Transfer: Minimal assistance;Moderate assistance;+2 for physical assistance;+2 for safety/equipment;BSC;RW;Ambulation;Cueing for safety;Cueing for sequencing;Adhering to hip precautions Toilet Transfer Details (indicate cue type and reason): simulated with functional mobility and reclincer transfer, requires step by step cues for sequencing movement of walker, LEs to take steps Toileting- Clothing Manipulation and Hygiene: Maximal assistance;Total assistance;Sitting/lateral lean;Sit to/from stand       Functional mobility during ADLs: Moderate assistance;Minimal assistance;Cueing for safety;Cueing for sequencing;+2 for safety/equipment;+2 for physical assistance;Rolling walker General ADL Comments: Patient requires increased assistance with self care due to decreased strength, balance, safety awareness, knowledge of precautions                  Pertinent Vitals/Pain Pain Assessment: Faces Faces Pain Scale: Hurts even more Pain Location: R hip  with mobility Pain Descriptors /  Indicators: Grimacing;Discomfort;Operative site guarding Pain Intervention(s): Monitored during session;RN gave pain meds during session     Hand Dominance Right   Extremity/Trunk Assessment Upper Extremity Assessment Upper Extremity Assessment: Generalized weakness   Lower Extremity Assessment Lower Extremity Assessment: Defer to PT evaluation       Communication Communication Communication: No difficulties   Cognition Arousal/Alertness: Awake/alert Behavior During Therapy: WFL for tasks assessed/performed Overall Cognitive Status: Within Functional Limits for tasks assessed                                 General Comments: some difficulty with sequencing however suspect due to post op day 1/medications   General Comments  pt desaturate on room air to mid 80s therefore donned prior to bed mobility.             Home Living Family/patient expects to be discharged to:: Private residence Living Arrangements: Alone Available Help at Discharge: Family Type of Home: House Home Access: Stairs to enter [employer] of Steps: 2 front, 3 in back Entrance Stairs-Rails: None Home Layout: One level     Bathroom Shower/Tub: Occupational psychologist: Handicapped height     Home Equipment: [place];Shower seat - built in          Prior Functioning/Environment Level of Independence: Independent        Comments: still drives        OT Problem List: Decreased strength;Decreased activity tolerance;Impaired balance (sitting and/or standing);Decreased safety awareness;Decreased knowledge of use of DME or AE;Decreased knowledge of precautions;Pain      OT Treatment/Interventions: Self-care/ADL training;Therapeutic exercise;Energy conservation;DME and/or AE instruction;Therapeutic activities;Patient/family education;Balance training    OT Goals(Current goals can be found in the care plan section) Acute Rehab OT Goals Patient  Stated Goal: "to see what I can do" OT Goal Formulation: With patient Time For Goal Achievement: 11/12/19 Potential to Achieve Goals: Good  OT Frequency: Min 2X/week           Co-evaluation PT/OT/SLP Co-Evaluation/Treatment: Yes Reason for Co-Treatment: For patient/therapist safety;To address functional/ADL transfers   OT goals addressed during session: ADL's and self-care      AM-PAC OT "6 Clicks" Daily Activity     Outcome Measure Help from another person eating meals?: None Help from another person taking care of personal grooming?: A Little Help from another person toileting, which includes using toliet, bedpan, or urinal?: A Lot Help from another person bathing (including washing, rinsing, drying)?: A Lot Help from another person to put on and taking off regular upper body clothing?: A Little Help from another person to put on and taking off regular lower body clothing?: Total 6 Click Score: 15   End of Session Equipment Utilized During Treatment: Rolling walker;Oxygen Nurse Communication: Mobility status  Activity Tolerance: Patient tolerated treatment well;Patient limited by pain Patient left: in chair;with call bell/phone within reach;with chair alarm set;with family/visitor present  OT Visit Diagnosis: Other abnormalities of gait and mobility (R26.89);Unsteadiness on feet (R26.81);Muscle weakness (generalized) (M62.81);History of falling (Z91.81);Pain Pain - Right/Left: Right Pain - part of body: Hip                Time: [id number] OT Time Calculation (min): 65 min Charges:  OT General Charges $OT Visit: 1 Visit OT Evaluation $OT Eval Moderate Complexity: 1 Mod OT Treatments $Self Care/Home Management : 23-37 mins  Delbert Phenix OT Pager:  [phone]  Rosemary Holms 10/29/2019, 12:00 PM

## 2019-10-29 NOTE — TOC Initial Note (Addendum)
Transition of Care [facility]) - Initial/Assessment Note    Patient Details  Name: Stephanie Cortez MRN: [id number] Date of Birth: [birth year]  Transition of [facility]) CM/SW Contact:    Lia Hopping, [place] Phone Number: 10/29/2019, 10:54 AM  Clinical Narrative:     Patient admitted after a fall with femoral neck fracture, closed. Patient independent prior to her fall. CSW met with the patient, son and daughter to discuss SNF rehab placement. This is the first time the patient will need rehab at [facility]. CSW  explained the SNF process and answered all patient and concerns about facility placement. CSW will follow up with list of facility choices.  FL2 complete.   11:45am CSW discuss CIR protcol with patient daughter/and therapist Yong Channel.    Expected Discharge Plan: Skilled Nursing Facility Barriers to Discharge: Continued Medical Work up   Patient Goals and CMS Choice   CMS Medicare.gov Compare Post Acute Care list provided to:: Patient Choice offered to / list presented to : Patient, Adult Children  Expected Discharge Plan and Services Expected Discharge Plan: [place] In-house Referral: Clinical Social Work Discharge Planning Services: NA Post Acute Care Choice: [place] Living arrangements for the past 2 months: Single Family Home                 DME Arranged: N/A DME Agency: NA         [place] Agency: NA        Prior Living Arrangements/Services Living arrangements for the past 2 months: [place] Lives with:: Self Patient language and need for interpreter reviewed:: No Do you feel safe going back to the place where you live?: Yes      Need for Family Participation in Patient Care: Yes (Comment) Care giver support system in place?: Yes (comment) Current home services: DME Criminal Activity/Legal Involvement Pertinent to Current Situation/Hospitalization: No - Comment as needed  Activities of Daily Living Home Assistive  Devices/Equipment: Eyeglasses ADL Screening (condition at time of admission) Patient's cognitive ability adequate to safely complete daily activities?: Yes Is the patient deaf or have difficulty hearing?: No Does the patient have difficulty seeing, even when wearing glasses/contacts?: No Does the patient have difficulty concentrating, remembering, or making decisions?: No Patient able to express need for assistance with ADLs?: Yes Does the patient have difficulty dressing or bathing?: Yes Independently performs ADLs?: No Communication: Independent Dressing (OT): Needs assistance Is this a change from baseline?: Change from baseline, expected to last >3 days Grooming: Needs assistance Is this a change from baseline?: Change from baseline, expected to last >3 days Feeding: Needs assistance Is this a change from baseline?: Change from baseline, expected to last >3 days Bathing: Needs assistance Is this a change from baseline?: Change from baseline, expected to last >3 days Toileting: Dependent Is this a change from baseline?: Change from baseline, expected to last >3days In/Out Bed: Dependent Is this a change from baseline?: Change from baseline, expected to last >3 days Walks in Home: Dependent Is this a change from baseline?: Change from baseline, expected to last >3 days Does the patient have difficulty walking or climbing stairs?: Yes Weakness of Legs: Right Weakness of Arms/Hands: None  Permission Sought/Granted Permission sought to share information with : Facility Sport and exercise psychologist, Family Supports Permission granted to share information with : Yes, Verbal Permission Granted  Share Information with NAME: Chapin,[name] Daughter  Permission granted to share info w AGENCY: SNF's  Permission granted to share info w Relationship: Daughter  Permission granted to share info w Contact Information: [phone]  [phone]  Emotional Assessment Appearance:: Appears stated  age Attitude/Demeanor/Rapport: Engaged Affect (typically observed): Accepting, Pleasant Orientation: : Oriented to Self, Oriented to Place, Oriented to  Time, Oriented to Situation Alcohol / Substance Use: Not Applicable Psych Involvement: No (comment)  Admission diagnosis:  Closed subcapital fracture of right femur, initial encounter ([place]) [S72.011A] Closed right hip fracture, initial encounter [facility]) [S72.001A] Patient Active Problem List   Diagnosis Date Noted  . Closed right hip fracture, initial encounter ([place]) 10/27/2019  . Hoarseness 02/19/2018  . Sensorineural hearing loss (SNHL) of both ears 02/19/2018  . Tinnitus, bilateral 02/19/2018  . Cough 09/06/2017  . Cough variant asthma 08/19/2017  . Acute bronchitis 02/20/2016  . Pulmonary air trapping 01/04/2016  . Encounter for preoperative pulmonary examination 01/04/2016  . NSIP (nonspecific interstitial pneumonia) ([place]) 09/26/2015  . Chronic sinusitis 09/26/2015  . ILD (interstitial lung disease) ([place]) 07/12/2014  . Upper airway cough syndrome 09/23/2013  . Encounter for therapeutic drug monitoring 08/11/2013  . Congestive heart failure ([place]) 07/08/2013  . Coronary artery calcification seen on CAT scan 03/15/2013  . Nodule of left lung 03/15/2013  . Orthostatic lightheadedness 02/09/2013  . Situational mixed anxiety and depressive disorder   . Hyponatremia 04/03/2012  . Diverticulitis 01/01/2012  . GERD with stricture 01/01/2012  . Hypothyroid 11/23/2010  . Edema 10/04/2010  . Bronchiectasis ([place])   . HYPERTENSION, BENIGN 10/22/2008  . A-fib [facility])    PCP:  [name], MD Pharmacy:   [place], [place] [address] Phone: [phone] Fax: [phone]     Social Determinants of Health (SDOH) Interventions    Readmission Risk Interventions No flowsheet data found.

## 2019-10-29 NOTE — Progress Notes (Addendum)
PROGRESS NOTE    Stephanie Cortez    Code Status: Full Code  [id number] DOB: 11-02-1933 DOA: 10/27/2019 LOS: 2 days  PCP: [name], MD CC:  Chief Complaint  Patient presents with  . Fall  . Hip Pain       Hospital Summary   This is an 86 year old female with history of permanent atrial fibrillation on Coumadin, chronic diastolic heart failure, hypertension, suspected ILD, hypothyroidism, hyperlipidemia, depression with recent fall after tripping over her dog landing on right hip.  In the ED, patient had CT of the head which was negative for acute abnormalities, x-ray of pelvis showed acute mildly displaced subcapital fracture of the proximal right femur.  Orthopedic surgery was consulted.  4/28: Right hip hemiarthroplasty for femoral neck fracture with Dr. Lorin Mercy -Overnight patient requiring increased amounts of supplemental O2   A & P   Principal Problem:   Closed right hip fracture, initial encounter [facility]) Active Problems:   HYPERTENSION, BENIGN   A-fib ([place])   Congestive heart failure ([place])   ILD (interstitial lung disease) ([place])   1. Acute impacted subcapital fracture of proximal right femur secondary to mechanical fall from standing height, s/p right hip hemiarthroplasty with Dr. Lorin Mercy (10/28/2019) a. Having postop pain b. PT eval: SNF at DC  2. Acute on chronic hypoxic respiratory failure, concern for atelectasis and mild volume overload a. Chronic ILD on 2 L nasal cannula nightly at baseline b. Overnight requiring 3 L/min O2 without much improvement from incentive spirometer.  On 5 L/min O2 this a.m. however patient relatively asymptomatic c. Noted patient had been receiving high volumes of half-normal saline d. CXR with improved findings from prior and concern of mild atelectasis e. Weight increased from 87.1->88.9 kg as well as +2870 intake f. Patient was advised on proper use of incentive spirometer today at bedside g. Discontinue half-normal saline h. On  Lasix as needed at home, has not been getting here.  will give a dose of Lasix 40 mg IV x1  3. Permanent atrial fibrillation a. INR 1.6 b. Coumadin restarted and to be managed by pharmacy c. Continue bisoprolol and Cardizem  4. Hypertension a. Continue current regimen  5. Chronic diastolic heart failure, with mild volume overload a. Echo today: EF 60 to 65% with moderately reduced RV systolic function and moderately enlarged RV with moderately elevated pulmonary artery systolic pressure b. I/O and daily weights with fluid restriction c. Continue home medications and Lasix as above  6. Hypothyroidism on levothyroxine  7. Hyponatremia, Suspect secondary to half-normal saline a. Discontinue IV fluids and give Lasix  8. Hyperlipidemia on statin  9. Depression on fluoxetine, as needed?  10. Proteinuria a. Continue ARB b. Consider outpatient nephrology at discharge  11. Abnormal CXR a. CXR with new superimposed confluent airspace consolidation in LUL concerning for superimposed pneumonia number patient is afebrile and without cough so this is unlikely pneumonia at this time.  Resolved on repeat x-ray   DVT prophylaxis: SCD, Coumadin Family Communication: Patient's family at bedside has been updated  Disposition Plan:  Status is: Inpatient  Remains inpatient appropriate because:Inpatient level of care appropriate due to severity of illness and Surgery   Dispo: The patient is from: Home              Anticipated d/c is to: TBD              Anticipated d/c date is: 2 days  Patient currently is not medically stable to d/c.  Needs improved sodium and INR prior to discharge to SNF    Pressure injury documentation    None  Consultants  Orthopedic surgery   Procedures  4/28 right hip hemiarthroplasty  Antibiotics   Anti-infectives (From admission, onward)   Start     Dose/Rate Route Frequency Ordered Stop   10/28/19 1400  ceFAZolin (ANCEF) IVPB 2g/100 mL  premix     2 g 200 mL/hr over 30 Minutes Intravenous On call to O.R. 10/28/19 [id number] 10/28/19 1545        Subjective   Complaining of persistent right hip discomfort.  Denies any shortness of breath, chest pain, nausea or vomiting.  Overnight patient required increased amounts of O2 without much improvement with incentive spirometer.  She was on 5 L/min O2 at my presentation which was turned down to 2 L/min and patient remained at SPO2 95%  Objective   Vitals:   10/29/19 0617 10/29/19 0805 10/29/19 1035 10/29/19 1322  BP: (!) 99/46  (!) 105/51 (!) 105/51  Pulse: (!) 55  67 (!) 56  Resp: 14  16 16   Temp: 98.4 F (36.9 C)     TempSrc: Oral     SpO2: 94% 95% 95% 92%  Weight: 88.9 kg     Height:        Intake/Output Summary (Last 24 hours) at 10/29/2019 1407 Last data filed at 10/29/2019 1000 Gross per 24 hour  Intake 2574.1 ml  Output 625 ml  Net 1949.1 ml   Filed Weights   10/27/19 1502 10/29/19 0617  Weight: 87.1 kg 88.9 kg    Examination:  Physical Exam Vitals and nursing note reviewed.  Constitutional:      Appearance: Normal appearance.  HENT:     Head: Normocephalic and atraumatic.  Eyes:     Conjunctiva/sclera: Conjunctivae normal.  Cardiovascular:     Rate and Rhythm: Normal rate and regular rhythm.  Pulmonary:     Effort: Pulmonary effort is normal.     Breath sounds: Rales present.  Abdominal:     General: Abdomen is flat.     Palpations: Abdomen is soft.  Musculoskeletal:        General: No swelling or tenderness.     Comments: Right hip with postsurgical dressing clean dry and intact  Skin:    Coloration: Skin is not jaundiced or pale.  Neurological:     Mental Status: She is alert. Mental status is at baseline.  Psychiatric:        Mood and Affect: Mood normal.        Behavior: Behavior normal.     Data Reviewed: I have personally reviewed following labs and imaging studies  CBC: Recent Labs  Lab 10/27/19 1200 10/28/19 0250  10/29/19 0309  WBC 15.6* 13.8* 9.7  NEUTROABS 14.2*  --   --   HGB 13.1 11.7* 10.7*  HCT 41.7 37.4 35.7*  MCV 96.8 97.4 100.3*  PLT 253 206 [id number]   Basic Metabolic Panel: Recent Labs  Lab 10/27/19 1200 10/28/19 0250 10/29/19 0309  NA 132* 129* 127*  K 4.4 4.7 4.4  CL 99 97* 97*  CO2 21* 22 22  GLUCOSE 146* 117* 146*  BUN 12 13 20   CREATININE 0.54 0.66 0.69  CALCIUM 8.8* 8.5* 8.3*   GFR: Estimated Creatinine Clearance: 58.8 mL/min (by C-G formula based on SCr of 0.69 mg/dL). Liver Function Tests: Recent Labs  Lab 10/27/19 1200  AST 30  ALT 18  ALKPHOS 100  BILITOT 1.0  PROT 6.6  ALBUMIN 3.4*   No results for input(s): LIPASE, AMYLASE in the last 168 hours. No results for input(s): AMMONIA in the last 168 hours. Coagulation Profile: Recent Labs  Lab 10/27/19 1159 10/28/19 0250 10/29/19 0309  INR 1.5* 1.6* 1.6*   Cardiac Enzymes: No results for input(s): CKTOTAL, CKMB, CKMBINDEX, TROPONINI in the last 168 hours. BNP (last 3 results) No results for input(s): PROBNP in the last 8760 hours. HbA1C: No results for input(s): HGBA1C in the last 72 hours. CBG: No results for input(s): GLUCAP in the last 168 hours. Lipid Profile: No results for input(s): CHOL, HDL, LDLCALC, TRIG, CHOLHDL, LDLDIRECT in the last 72 hours. Thyroid Function Tests: No results for input(s): TSH, T4TOTAL, FREET4, T3FREE, THYROIDAB in the last 72 hours. Anemia Panel: No results for input(s): VITAMINB12, FOLATE, FERRITIN, TIBC, IRON, RETICCTPCT in the last 72 hours. Sepsis Labs: No results for input(s): PROCALCITON, LATICACIDVEN in the last 168 hours.  Recent Results (from the past 240 hour(s))  Respiratory Panel by RT PCR (Flu A&B, Covid) - Nasopharyngeal Swab     Status: None   Collection Time: 10/27/19  1:12 PM   Specimen: Nasopharyngeal Swab  Result Value Ref Range Status   SARS Coronavirus 2 by RT PCR NEGATIVE NEGATIVE Final    Comment: (NOTE) SARS-CoV-2 target nucleic acids are  NOT DETECTED. The SARS-CoV-2 RNA is generally detectable in upper respiratoy specimens during the acute phase of infection. The lowest concentration of SARS-CoV-2 viral copies this assay can detect is 131 copies/mL. A negative result does not preclude SARS-Cov-2 infection and should not be used as the sole basis for treatment or other patient management decisions. A negative result may occur with  improper specimen collection/handling, submission of specimen other than nasopharyngeal swab, presence of viral mutation(s) within the areas targeted by this assay, and inadequate number of viral copies (<131 copies/mL). A negative result must be combined with clinical observations, patient history, and epidemiological information. The expected result is Negative. Fact Sheet for Patients:  PinkCheek.be Fact Sheet for Healthcare Providers:  GravelBags.it This test is not yet ap proved or cleared by the [place] FDA and  has been authorized for detection and/or diagnosis of SARS-CoV-2 by FDA under an Emergency Use Authorization (EUA). This EUA will remain  in effect (meaning this test can be used) for the duration of the COVID-19 declaration under Section 564(b)(1) of the Act, 21 U.S.C. section 360bbb-3(b)(1), unless the authorization is terminated or revoked sooner.    Influenza A by PCR NEGATIVE NEGATIVE Final   Influenza B by PCR NEGATIVE NEGATIVE Final    Comment: (NOTE) The Xpert Xpress SARS-CoV-2/FLU/RSV assay is intended as an aid in  the diagnosis of influenza from Nasopharyngeal swab specimens and  should not be used as a sole basis for treatment. Nasal washings and  aspirates are unacceptable for Xpert Xpress SARS-CoV-2/FLU/RSV  testing. Fact Sheet for Patients: PinkCheek.be Fact Sheet for Healthcare Providers: GravelBags.it This test is not yet approved or  cleared by the [place] FDA and  has been authorized for detection and/or diagnosis of SARS-CoV-2 by  FDA under an Emergency Use Authorization (EUA). This EUA will remain  in effect (meaning this test can be used) for the duration of the  Covid-19 declaration under Section 564(b)(1) of the Act, 21  U.S.C. section 360bbb-3(b)(1), unless the authorization is  terminated or revoked. Performed at [facility], [place] [address] [place],  [address]   Surgical  PCR screen     Status: None   Collection Time: 10/28/19  3:00 AM   Specimen: Nasal Mucosa; Nasal Swab  Result Value Ref Range Status   MRSA, PCR NEGATIVE NEGATIVE Final   Staphylococcus aureus NEGATIVE NEGATIVE Final    Comment: (NOTE) The Xpert SA Assay (FDA approved for NASAL specimens in patients 38 years of age and older), is one component of a comprehensive surveillance program. It is not intended to diagnose infection nor to guide or monitor treatment. Performed at [facility], [place] [address] [place], [place] [address]   Urine culture     Status: Abnormal   Collection Time: 10/28/19  6:09 AM   Specimen: Urine, Clean Catch  Result Value Ref Range Status   Specimen Description   Final    URINE, CLEAN CATCH Performed at [facility], [place] [address] [place], [place] [address]    Special Requests   Final    NONE Performed at [facility], [place] [address] [place], [place] [address]    Culture MULTIPLE SPECIES PRESENT, SUGGEST RECOLLECTION (A)  Final   Report Status 10/29/2019 FINAL  Final         Radiology Studies: Pelvis Portable  Result Date: 10/28/2019 CLINICAL DATA:  Right hip replacement EXAM: PORTABLE PELVIS 1-2 VIEWS COMPARISON:  None. FINDINGS: Interval right total hip arthroplasty. Expected appearance on this single view. IMPRESSION: Post right total hip arthroplasty. Electronically Signed   By: Macy Mis M.D.   On:  10/28/2019 19:20   DG CHEST PORT 1 VIEW  Result Date: 10/29/2019 CLINICAL DATA:  Postoperative right hip arthroplasty, CHF, hypoxia EXAM: PORTABLE CHEST 1 VIEW COMPARISON:  10/27/2019 FINDINGS: Interval improvement in bilateral interstitial and heterogeneous airspace opacity. Probable left basilar atelectasis or consolidation. Cardiomegaly. Chronic fracture deformity of the proximal left humerus. IMPRESSION: 1. Interval improvement in bilateral interstitial and heterogeneous airspace opacity, most consistent with improved edema. 2.  Probable left basilar atelectasis or consolidation. 3.  Cardiomegaly. Electronically Signed   By: Eddie Candle M.D.   On: 10/29/2019 09:33   ECHOCARDIOGRAM LIMITED  Result Date: 10/28/2019    ECHOCARDIOGRAM LIMITED REPORT   Patient Name:   Stephanie Cortez Date of Exam: 10/28/2019 Medical Rec #:  [id number]       Height:       66.0 in Accession #:    [id number]      Weight:       192.0 lb Date of Birth:  1933/12/06       BSA:          1.965 m Patient Age:    14 years        BP:           92/56 mmHg Patient Gender: F               HR:           99 bpm. Exam Location:  Inpatient Procedure: Limited Echo STAT ECHO Indications:    perioperative evaluation  History:        Patient has prior history of Echocardiogram examinations, most                 recent 06/30/2013. CHF, COPD, Arrythmias:Atrial Fibrillation;                 Risk Factors:Hypertension and Dyslipidemia.  Sonographer:    Jannett Celestine RDCS (AE) Referring Phys: [place] Comments: restricted mobility IMPRESSIONS  1.  Left ventricular ejection fraction, by estimation, is 60 to 65%. The left ventricle has normal function. The left ventricle has no regional wall motion abnormalities. Unable to assess LV diastolic filling due to underlying atrial fibrillation.  2. Right ventricular systolic function is moderately reduced. The right ventricular size is moderately enlarged. There is moderately elevated  pulmonary artery systolic pressure. The estimated right ventricular systolic pressure is [id number] mmHg.  3. Right atrial size was severely dilated.  4. The mitral valve is normal in structure. Mild mitral valve regurgitation. No evidence of mitral stenosis.  5. Tricuspid valve regurgitation is mild to moderate.  6. The aortic valve is tricuspid. Aortic valve regurgitation is not visualized. Mild aortic valve sclerosis is present, with no evidence of aortic valve stenosis.  7. The inferior vena cava is dilated in size with <50% respiratory variability, suggesting right atrial pressure of 15 mmHg. FINDINGS  Left Ventricle: Left ventricular ejection fraction, by estimation, is 60 to 65%. The left ventricle has normal function. The left ventricle has no regional wall motion abnormalities. The left ventricular internal cavity size was normal in size. There is  no left ventricular hypertrophy. Right Ventricle: The right ventricular size is moderately enlarged. No increase in right ventricular wall thickness. Right ventricular systolic function is moderately reduced. There is moderately elevated pulmonary artery systolic pressure. The tricuspid  regurgitant velocity is 3.37 m/s, and with an assumed right atrial pressure of 15 mmHg, the estimated right ventricular systolic pressure is [id number] mmHg. Left Atrium: Left atrial size was normal in size. Right Atrium: Right atrial size was severely dilated. Pericardium: There is no evidence of pericardial effusion. Mitral Valve: The mitral valve is normal in structure. Normal mobility of the mitral valve leaflets. Mild mitral annular calcification. Mild mitral valve regurgitation. No evidence of mitral valve stenosis. Tricuspid Valve: The tricuspid valve is normal in structure. Tricuspid valve regurgitation is mild to moderate. No evidence of tricuspid stenosis. Aortic Valve: The aortic valve is tricuspid. Aortic valve regurgitation is not visualized. Mild aortic valve sclerosis is  present, with no evidence of aortic valve stenosis. Pulmonic Valve: The pulmonic valve was normal in structure. Pulmonic valve regurgitation is mild. No evidence of pulmonic stenosis. Aorta: The aortic root is normal in size and structure. Venous: The inferior vena cava is dilated in size with less than 50% respiratory variability, suggesting right atrial pressure of 15 mmHg. IAS/Shunts: No atrial level shunt detected by color flow Doppler.  TRICUSPID VALVE TR Peak grad:   45.4 mmHg TR Vmax:        337.00 cm/s Fransico Him MD Electronically signed by Fransico Him MD Signature Date/Time: 10/28/2019/10:44:56 AM    Final         Scheduled Meds: . bisoprolol  10 mg Oral Daily  . Chlorhexidine Gluconate Cloth  6 each Topical Daily  . diltiazem  240 mg Oral QHS  . docusate sodium  100 mg Oral BID  . irbesartan  300 mg Oral Daily  . levothyroxine  50 mcg Oral QAC breakfast  . mometasone-formoterol  2 puff Inhalation BID  . mupirocin ointment  1 application Nasal BID  . pravastatin  40 mg Oral QHS  . traMADol  50 mg Oral Q6H  . warfarin  2.5 mg Oral ONCE-1600  . Warfarin - Pharmacist Dosing Inpatient   Does not apply q1600   Continuous Infusions:    Time spent: 30 minutes with over 50% of the time coordinating the patient's care    Harold Hedge,  DO [facility] Pager [phone]  Call night coverage person covering after 7pm

## 2019-10-29 NOTE — Telephone Encounter (Signed)
Cont'd:  Discuss options for getting pt admitted to [facility] inpatient as pt has multiple underlying conditions. Pt and Pt's daughter trust Stephanie Cortez and would like to hear from him directly if possible.

## 2019-10-29 NOTE — Progress Notes (Signed)
Physical Therapy Treatment Patient Details Name: Stephanie Cortez MRN: [id number] DOB: 03-06-1934 Today's Date: 10/29/2019    History of Present Illness Patient is a 86 year old female admitted after a fall with closed femoral neck fracture. Pt is s/p R hemiarthoplasty.      PT Comments    Pt very cooperative but requiring increased time and step-by-step instruction for performance of all tasks.   Follow Up Recommendations  CIR     Equipment Recommendations  None recommended by PT    Recommendations for Other Services       Precautions / Restrictions Precautions Precautions: Posterior Hip Precaution Booklet Issued: Yes (comment) Restrictions Weight Bearing Restrictions: No RLE Weight Bearing: Weight bearing as tolerated    Mobility  Bed Mobility Overal bed mobility: Needs Assistance Bed Mobility: Sit to Supine     Supine to sit: Mod assist;Max assist;HOB elevated Sit to supine: Mod assist;Max assist;+2 for physical assistance;+2 for safety/equipment   General bed mobility comments: Increased time with cues for sequence and physical assist to manage R LE and control trunk  Transfers Overall transfer level: Needs assistance Equipment used: Rolling walker (2 wheeled) Transfers: Sit to/from Stand Sit to Stand: Mod assist;+2 physical assistance;+2 safety/equipment         General transfer comment: max cues for sequencing, proper hand placement and body mechanics  Ambulation/Gait Ambulation/Gait assistance: Min assist;Mod assist;+2 physical assistance;+2 safety/equipment Gait Distance (Feet): 9 Feet Assistive device: Rolling walker (2 wheeled) Gait Pattern/deviations: Step-to pattern;Decreased step length - right;Decreased step length - left;Shuffle;Trunk flexed Gait velocity: decr   General Gait Details: Step-be-step cues for sequence, foot placement and UE WB.     Stairs             Wheelchair Mobility    Modified Rankin (Stroke Patients Only)        Balance Overall balance assessment: Needs assistance Sitting-balance support: No upper extremity supported;Feet supported Sitting balance-Leahy Scale: Good     Standing balance support: Bilateral upper extremity supported;During functional activity Standing balance-Leahy Scale: Poor Standing balance comment: reliant on external support                             Cognition Arousal/Alertness: Awake/alert Behavior During Therapy: WFL for tasks assessed/performed Overall Cognitive Status: Within Functional Limits for tasks assessed                                 General Comments: some difficulty with sequencing however suspect due to post op day 1/medications      Exercises      General Comments General comments (skin integrity, edema, etc.): pt desaturate on room air to mid 80s therefore donned prior to bed mobility.       Pertinent Vitals/Pain Pain Assessment: Faces Faces Pain Scale: Hurts even more Pain Location: R hip with mobility Pain Descriptors / Indicators: Grimacing;Discomfort;Operative site guarding Pain Intervention(s): Limited activity within patient's tolerance;Monitored during session;Premedicated before session;Ice applied    Home Living Family/patient expects to be discharged to:: Private residence Living Arrangements: Alone Available Help at Discharge: Family Type of Home: House Home Access: Stairs to enter Entrance Stairs-Rails: None Home Layout: One level Home Equipment: Grab bars - tub/shower;Shower seat - built in      Prior Function Level of Independence: Independent      Comments: still drives   PT Goals (current goals can now be  found in the care plan section) Acute Rehab PT Goals Patient Stated Goal: "to see what I can do" PT Goal Formulation: With patient Time For Goal Achievement: 11/12/19 Potential to Achieve Goals: Fair Progress towards PT goals: Progressing toward goals    Frequency    Min  5X/week      PT Plan Current plan remains appropriate    Co-evaluation PT/OT/SLP Co-Evaluation/Treatment: Yes Reason for Co-Treatment: For patient/therapist safety PT goals addressed during session: Mobility/safety with mobility OT goals addressed during session: ADL's and self-care      AM-PAC PT "6 Clicks" Mobility   Outcome Measure  Help needed turning from your back to your side while in a flat bed without using bedrails?: A Lot Help needed moving from lying on your back to sitting on the side of a flat bed without using bedrails?: A Lot Help needed moving to and from a bed to a chair (including a wheelchair)?: A Lot Help needed standing up from a chair using your arms (e.g., wheelchair or bedside chair)?: A Lot Help needed to walk in hospital room?: A Lot Help needed climbing 3-5 steps with a railing? : Total 6 Click Score: 11    End of Session Equipment Utilized During Treatment: Gait belt Activity Tolerance: Patient tolerated treatment well;Patient limited by fatigue Patient left: with call bell/phone within reach;with family/visitor present;in bed;with bed alarm set Nurse Communication: Mobility status PT Visit Diagnosis: Difficulty in walking, not elsewhere classified (R26.2)     Time: [id number] PT Time Calculation (min) (ACUTE ONLY): 19 min  Charges:  $Gait Training: 8-22 mins                     [place] Pager [phone] Office [phone]    Stephanie Cortez 10/29/2019, 2:48 PM

## 2019-10-29 NOTE — [place] FL2 (Addendum)
[place] MEDICAID FL2 LEVEL OF CARE SCREENING TOOL     IDENTIFICATION  Patient Name: Stephanie Cortez Birthdate: 1934/06/05 Sex: female Admission Date (Current Location): 10/27/2019  [facility] and [place] Number:  Herbalist and Address:  [facility],  [address] [place], [place]      Provider Number: [id number]  Attending Physician Name and Address:  Harold Hedge, MD  Relative Name and Phone Number:    [name] Daughter [phone]                                                   [phone]      Current Level of Care: SNF Recommended Level of Care: [place] Prior Approval Number:    Date Approved/Denied:   [place] Number:    [id number]  Discharge Plan: SNF    Current Diagnoses: Patient Active Problem List   Diagnosis Date Noted  . Closed right hip fracture, initial encounter ([place]) 10/27/2019  . Hoarseness 02/19/2018  . Sensorineural hearing loss (SNHL) of both ears 02/19/2018  . Tinnitus, bilateral 02/19/2018  . Cough 09/06/2017  . Cough variant asthma 08/19/2017  . Acute bronchitis 02/20/2016  . Pulmonary air trapping 01/04/2016  . Encounter for preoperative pulmonary examination 01/04/2016  . NSIP (nonspecific interstitial pneumonia) ([place]) 09/26/2015  . Chronic sinusitis 09/26/2015  . ILD (interstitial lung disease) ([place]) 07/12/2014  . Upper airway cough syndrome 09/23/2013  . Encounter for therapeutic drug monitoring 08/11/2013  . Congestive heart failure ([place]) 07/08/2013  . Coronary artery calcification seen on CAT scan 03/15/2013  . Nodule of left lung 03/15/2013  . Orthostatic lightheadedness 02/09/2013  . Situational mixed anxiety and depressive disorder   . Hyponatremia 04/03/2012  . Diverticulitis 01/01/2012  . GERD with stricture 01/01/2012  . Hypothyroid 11/23/2010  . Edema 10/04/2010  . Bronchiectasis ([place])   . HYPERTENSION, BENIGN 10/22/2008  . A-fib (HCC)     Orientation RESPIRATION  BLADDER Height & Weight     Self, Time, Situation, Place  O2 Continent Weight: 195 lb 15.8 oz (88.9 kg) Height:  5\' 6"  (167.6 cm)  BEHAVIORAL SYMPTOMS/MOOD NEUROLOGICAL BOWEL NUTRITION STATUS      Continent Diet(Heart Healthy)  AMBULATORY STATUS COMMUNICATION OF NEEDS Skin   Extensive Assist Verbally Surgical wounds                       Personal Care Assistance Level of Assistance  Bathing, Dressing, Feeding Bathing Assistance: Limited assistance Feeding assistance: Independent Dressing Assistance: Limited assistance     Functional Limitations Info  Sight, Hearing, Speech Sight Info: Adequate Hearing Info: Adequate Speech Info: Adequate    SPECIAL CARE FACTORS FREQUENCY  PT (By licensed PT), OT (By licensed OT)     PT Frequency: 5x/week OT Frequency: 5x/week            Contractures Contractures Info: Not present    Additional Factors Info  Code Status, Allergies, Psychotropic Code Status Info: Fullcode Allergies Info: Doxycycline, Flagyl Metronidazole, Moxifloxacin, Pantoprazole Sodium, Penicillins Psychotropic Info: Ultram         Current Medications (10/29/2019):  This is the current hospital active medication list Current Facility-Administered Medications  Medication Dose Route Frequency Provider Last Rate Last Admin  . acetaminophen (TYLENOL) tablet 325-650 mg  325-650 mg Oral Q6H PRN Marybelle Killings, MD      .  bisoprolol (ZEBETA) tablet 10 mg  10 mg Oral Daily Marybelle Killings, MD   10 mg at 10/29/19 0958  . Chlorhexidine Gluconate Cloth 2 % PADS 6 each  6 each Topical Daily Marybelle Killings, MD      . diltiazem (CARDIZEM CD) 24 hr capsule 240 mg  240 mg Oral QHS Marybelle Killings, MD   240 mg at 10/28/19 2113  . docusate sodium (COLACE) capsule 100 mg  100 mg Oral BID Marybelle Killings, MD   100 mg at 10/29/19 0958  . FLUoxetine (PROZAC) capsule 10 mg  10 mg Oral BID PRN Marybelle Killings, MD      . fluticasone (FLONASE) 50 MCG/ACT nasal spray 1 spray  1 spray Each Nare  BID PRN Marybelle Killings, MD      . furosemide (LASIX) tablet 20 mg  20 mg Oral Daily PRN Marybelle Killings, MD      . HYDROcodone-acetaminophen (NORCO) 7.5-325 MG per tablet 1-2 tablet  1-2 tablet Oral Q4H PRN Marybelle Killings, MD      . HYDROcodone-acetaminophen (NORCO/VICODIN) 5-325 MG per tablet 1-2 tablet  1-2 tablet Oral Q4H PRN Marybelle Killings, MD   1 tablet at 10/29/19 0844  . irbesartan (AVAPRO) tablet 300 mg  300 mg Oral Daily Marybelle Killings, MD   300 mg at 10/29/19 0958  . levothyroxine (SYNTHROID) tablet 50 mcg  50 mcg Oral QAC breakfast Marybelle Killings, MD   50 mcg at 10/29/19 0534  . menthol-cetylpyridinium (CEPACOL) lozenge 3 mg  1 lozenge Oral PRN Marybelle Killings, MD       Or  . phenol (CHLORASEPTIC) mouth spray 1 spray  1 spray Mouth/Throat PRN Marybelle Killings, MD      . metoCLOPramide (REGLAN) tablet 5-10 mg  5-10 mg Oral Q8H PRN Marybelle Killings, MD       Or  . metoCLOPramide (REGLAN) injection 5-10 mg  5-10 mg Intravenous Q8H PRN Marybelle Killings, MD      . mometasone-formoterol [facility]) 100-5 MCG/ACT inhaler 2 puff  2 puff Inhalation BID Marybelle Killings, MD   2 puff at 10/29/19 0804  . morphine 2 MG/ML injection 0.5-1 mg  0.5-1 mg Intravenous Q2H PRN Marybelle Killings, MD   1 mg at 10/29/19 0355  . mupirocin ointment (BACTROBAN) 2 % 1 application  1 application Nasal BID Marybelle Killings, MD   1 application at [id number]  . ondansetron (ZOFRAN) tablet 4 mg  4 mg Oral Q6H PRN Marybelle Killings, MD       Or  . ondansetron [facility]) injection 4 mg  4 mg Intravenous Q6H PRN Marybelle Killings, MD      . pravastatin (PRAVACHOL) tablet 40 mg  40 mg Oral QHS Marybelle Killings, MD   40 mg at 10/28/19 2113  . traMADol (ULTRAM) tablet 50 mg  50 mg Oral Q6H Marybelle Killings, MD   50 mg at 10/29/19 0534  . Warfarin - Pharmacist Dosing Inpatient   Does not apply q1600 Angela Adam [facility]         Discharge Medications: Please see discharge summary for a list of discharge medications.  Relevant Imaging Results:  Relevant Lab  Results:   Additional Information [id number]  Lia Hopping, LCSW

## 2019-10-29 NOTE — Progress Notes (Signed)
Subjective: 1 Day Post-Op Procedure(s) (LRB): ARTHROPLASTY BIPOLAR HIP (HEMIARTHROPLASTY) (Right) Patient reports pain as moderate.    Objective: Vital signs in last 24 hours: Temp:  [97.5 F (36.4 C)-99.6 F (37.6 C)] 98.4 F (36.9 C) (04/29 0617) Pulse Rate:  [54-78] 55 (04/29 0617) Resp:  [14-24] 14 (04/29 0617) BP: (99-130)/(46-102) 99/46 (04/29 0617) SpO2:  [84 %-100 %] 94 % (04/29 0617) Weight:  [88.9 kg] 88.9 kg (04/29 0617)  Intake/Output from previous day: 04/28 0701 - 04/29 0700 In: 2930.9 [P.O.:480; I.V.:2250.9; IV Piggyback:200] Out: 625 [Urine:525; Blood:100] Intake/Output this shift: No intake/output data recorded.  Recent Labs    10/27/19 1200 10/28/19 0250 10/29/19 0309  HGB 13.1 11.7* 10.7*   Recent Labs    10/28/19 0250 10/29/19 0309  WBC 13.8* 9.7  RBC 3.84* 3.56*  HCT 37.4 35.7*  PLT 206 171   Recent Labs    10/28/19 0250 10/29/19 0309  NA 129* 127*  K 4.7 4.4  CL 97* 97*  CO2 22 22  BUN 13 20  CREATININE 0.66 0.69  GLUCOSE 117* 146*  CALCIUM 8.5* 8.3*   Recent Labs    10/28/19 0250 10/29/19 0309  INR 1.6* 1.6*    Neurologically intact Pelvis Portable  Result Date: 10/28/2019 CLINICAL DATA:  Right hip replacement EXAM: PORTABLE PELVIS 1-2 VIEWS COMPARISON:  None. FINDINGS: Interval right total hip arthroplasty. Expected appearance on this single view. IMPRESSION: Post right total hip arthroplasty. Electronically Signed   By: Macy Mis M.D.   On: 10/28/2019 19:20   ECHOCARDIOGRAM LIMITED  Result Date: 10/28/2019    ECHOCARDIOGRAM LIMITED REPORT   Patient Name:   Stephanie Cortez Date of Exam: 10/28/2019 Medical Rec #:  [id number]       Height:       66.0 in Accession #:    [id number]      Weight:       192.0 lb Date of Birth:  [birth year]       BSA:          1.965 m Patient Age:    86 years        BP:           92/56 mmHg Patient Gender: F               HR:           99 bpm. Exam Location:  Inpatient Procedure: Limited Echo  STAT ECHO Indications:    perioperative evaluation  History:        Patient has prior history of Echocardiogram examinations, most                 recent 06/30/2013. CHF, COPD, Arrythmias:Atrial Fibrillation;                 Risk Factors:Hypertension and Dyslipidemia.  Sonographer:    Jannett Celestine RDCS (AE) Referring Phys: [place] Comments: restricted mobility IMPRESSIONS  1. Left ventricular ejection fraction, by estimation, is 60 to 65%. The left ventricle has normal function. The left ventricle has no regional wall motion abnormalities. Unable to assess LV diastolic filling due to underlying atrial fibrillation.  2. Right ventricular systolic function is moderately reduced. The right ventricular size is moderately enlarged. There is moderately elevated pulmonary artery systolic pressure. The estimated right ventricular systolic pressure is [id number] mmHg.  3. Right atrial size was severely dilated.  4. The mitral valve is normal in structure. Mild mitral valve regurgitation. No evidence of  mitral stenosis.  5. Tricuspid valve regurgitation is mild to moderate.  6. The aortic valve is tricuspid. Aortic valve regurgitation is not visualized. Mild aortic valve sclerosis is present, with no evidence of aortic valve stenosis.  7. The inferior vena cava is dilated in size with <50% respiratory variability, suggesting right atrial pressure of 15 mmHg. FINDINGS  Left Ventricle: Left ventricular ejection fraction, by estimation, is 60 to 65%. The left ventricle has normal function. The left ventricle has no regional wall motion abnormalities. The left ventricular internal cavity size was normal in size. There is  no left ventricular hypertrophy. Right Ventricle: The right ventricular size is moderately enlarged. No increase in right ventricular wall thickness. Right ventricular systolic function is moderately reduced. There is moderately elevated pulmonary artery systolic pressure. The tricuspid   regurgitant velocity is 3.37 m/s, and with an assumed right atrial pressure of 15 mmHg, the estimated right ventricular systolic pressure is [id number] mmHg. Left Atrium: Left atrial size was normal in size. Right Atrium: Right atrial size was severely dilated. Pericardium: There is no evidence of pericardial effusion. Mitral Valve: The mitral valve is normal in structure. Normal mobility of the mitral valve leaflets. Mild mitral annular calcification. Mild mitral valve regurgitation. No evidence of mitral valve stenosis. Tricuspid Valve: The tricuspid valve is normal in structure. Tricuspid valve regurgitation is mild to moderate. No evidence of tricuspid stenosis. Aortic Valve: The aortic valve is tricuspid. Aortic valve regurgitation is not visualized. Mild aortic valve sclerosis is present, with no evidence of aortic valve stenosis. Pulmonic Valve: The pulmonic valve was normal in structure. Pulmonic valve regurgitation is mild. No evidence of pulmonic stenosis. Aorta: The aortic root is normal in size and structure. Venous: The inferior vena cava is dilated in size with less than 50% respiratory variability, suggesting right atrial pressure of 15 mmHg. IAS/Shunts: No atrial level shunt detected by color flow Doppler.  TRICUSPID VALVE TR Peak grad:   45.4 mmHg TR Vmax:        337.00 cm/s Fransico Him MD Electronically signed by Fransico Him MD Signature Date/Time: 10/28/2019/10:44:56 AM    Final     Assessment/Plan: 1 Day Post-Op Procedure(s) (LRB): ARTHROPLASTY BIPOLAR HIP (HEMIARTHROPLASTY) (Right) Up with therapy, dressing change by RN, back on coumadin.   Stephanie Cortez 10/29/2019, 7:18 AM

## 2019-10-29 NOTE — Progress Notes (Signed)
Rehab Admissions Coordinator Note:  Patient was screened by Cleatrice Burke for appropriateness for an Inpatient Acute Rehab Consult per PT recs. OT recs SNF.  Please clarify if patient would like to be considered for CIR admit .  Hser, Autio RN MSN 10/29/2019, 3:51 PM  I can be reached at [phone].

## 2019-10-29 NOTE — Discharge Instructions (Signed)
Hip Fracture Treated With ORIF, Care After This sheet gives you information about how to care for yourself after your procedure. Your health care provider may also give you more specific instructions. If you have problems or questions, contact your health care provider. What can I expect after the procedure? After the procedure, it is common to have:  Pain. You will be given medicines to treat this.  Swelling.  Difficulty walking.  Some redness or bruising around the incision.  A small amount of fluid or blood from the incision. Follow these instructions at home: Medicines  Take over-the-counter and prescription medicines only as told by your health care provider.  You may be given a blood thinner to take for up to six weeks. This will help reduce the risk of developing a blood clot. It is important to use this medicine exactly as directed.  You may be given calcium and vitamin D supplements to strengthen your bones.  If you are taking prescription pain medicine, take actions to prevent or treat constipation. Your health care provider may recommend that you: ? Drink enough fluid to keep your urine pale yellow. ? Eat foods that are high in fiber, such as fresh fruits and vegetables, whole grains, and beans. ? Limit foods that are high in fat and processed sugars, such as fried or sweet foods. ? Take an over-the-counter or prescription medicine for constipation. Bathing  Do not take baths, swim, or use a hot tub until your health care provider approves. Ask your health care provider if you can take showers. You may only be allowed to take sponge baths.  Keep the bandage (dressing) dry until your health care provider says it can be removed. Incision care   Follow instructions from your health care provider about how to take care of your incision. Make sure you: ? Wash your hands with soap and water before you change your dressing. If soap and water are not available, use hand  sanitizer. ? Change your dressing as told by your health care provider. ? Leave stitches (sutures), skin glue, or adhesive strips in place. These skin closures may need to stay in place for 2 weeks or longer. If adhesive strip edges start to loosen and curl up, you may trim the loose edges. Do not remove adhesive strips completely unless your health care provider tells you to do that.  Check your incision area every day for signs of infection. Check for: ? More redness, swelling, or pain. ? More fluid or blood. ? Warmth. ? Pus or a bad smell. Managing pain, stiffness, and swelling   If directed, put ice on the affected area to prevent pain and swelling. ? Put ice in a plastic bag. ? Place a towel between your skin and the bag. ? Leave the ice on for 20 minutes, 2-3 times a day.  Move your toes often to avoid stiffness and to lessen swelling.  Raise (elevate) your leg above the level of your heart while you are sitting or lying down. To do this, try putting a few pillows under your leg. Activity   Return to your normal activities as told by your health care provider. Ask your health care provider what activities are safe for you.  Do exercises as told by your health care provider or physical therapist. This will help make your hip stronger and help you recover more quickly.  Do not use your injured limb to support (bear) your body weight until your health care provider says that   you can. Follow weight-bearing restrictions as told. Use crutches or other devices to help you move around (assistive devices) as directed.  You may feel most comfortable using a raised surface when sitting on the toilet or in a chair.  Consider using a toilet seat riser over the toilet for comfort. Driving  Do not drive or use heavy machinery while taking prescription pain medicine.  Ask your health care provider when it is safe for you to drive. General instructions  Wear compression stockings as told  by your health care provider. These stockings help to prevent blood clots and reduce swelling in your legs.  Do not use any products that contain nicotine or tobacco, such as cigarettes and e-cigarettes. These can delay bone healing. If you need help quitting, ask your health care provider.  Keep all follow-up visits as told by your health care provider. This is important. This may include visits for: ? Physical therapy. ? Screening for osteoporosis. Osteoporosis is thinning and loss of density in your bones. Contact a health care provider if you:  Have a fever.  Have pain that is not helped with medicine.  Have more redness, swelling, or pain at your incision area.  Have more fluid or blood coming from your incision or leaking through your dressing.  Notice that your incision feels warm to the touch.  Have pus or a bad smell coming from your incision area. Get help right away if you:  Notice that the edges of your incision have come apart after the sutures or staples have been removed.  Have pain, warmth, or tenderness in the back of your lower leg (calf).  Have tingling or numbness in your leg.  Have a pale and cold leg.  Have trouble breathing.  Have chest pain. Summary  After the procedure, it is common to have some pain and swelling.  Take pain medicines as directed by your health care provider. Icing may also help with pain control.  Contact your health care provider if you have signs of infection, severe pain, or more fluid or blood coming from your incision. This information is not intended to replace advice given to you by your health care provider. Make sure you discuss any questions you have with your health care provider. Document Revised: 03/08/2018 Document Reviewed: 07/29/2017 Elsevier Patient Education  2020 Elsevier Inc.  

## 2019-10-29 NOTE — Progress Notes (Signed)
ANTICOAGULATION CONSULT NOTE - Follow Up Consult  Pharmacy Consult for warfarin Indication: atrial fibrillation, s/p R THA  Allergies  Allergen Reactions  . Doxycycline     Rash on face and neck  . Flagyl [Metronidazole] Nausea And Vomiting  . Moxifloxacin Other (See Comments)     Weakness/fatigue  . Pantoprazole Sodium Rash  . Penicillins Rash    Has patient had a PCN reaction causing immediate rash, facial/tongue/throat swelling, SOB or lightheadedness with hypotension:unsure Has patient had a PCN reaction causing severe rash involving mucus membranes or skin necrosis:unsure Has patient had a PCN reaction that required hospitalization:No Has patient had a PCN reaction occurring within the last 10 years:Yes Cannot recall exact reaction due to time lapse If all of the above answers are "NO", then may proceed with Cephalosporin use.     Patient Measurements: Height: 5\' 6"  (167.6 cm) Weight: 88.9 kg (195 lb 15.8 oz) IBW/kg (Calculated) : 59.3   Vital Signs: Temp: 98.4 F (36.9 C) (04/29 0617) Temp Source: Oral (04/29 0617) BP: 105/51 (04/29 1035) Pulse Rate: 67 (04/29 1035)  Labs: Recent Labs    10/27/19 1159 10/27/19 1200 10/27/19 1200 10/28/19 0250 10/29/19 0309  HGB  --  13.1   < > 11.7* 10.7*  HCT  --  41.7  --  37.4 35.7*  PLT  --  253  --  206 171  LABPROT 17.9*  --   --  18.6* 18.9*  INR 1.5*  --   --  1.6* 1.6*  CREATININE  --  0.54  --  0.66 0.69   < > = values in this interval not displayed.    Estimated Creatinine Clearance: 58.8 mL/min (by C-G formula based on SCr of 0.69 mg/dL).   Medical History: Past Medical History:  Diagnosis Date  . Anemia    - Hgb 9.7gm% on 07/13/2008 in [place] -  Hgg 129gm% wiht normal irone levsl and ferritin 10/27/2008 in [place] Recurrent otitis/sinusitis  . Anxiety    chronic BZ prn  . Atrial fibrillation (HCC)    chronic anticoag  . Bronchiectasis    >PFT 07/13/2008 in [place] 1.9L/76%, FVC 2.45L/74, Ratio 79,  TLC 121%, DLCO 64%  AE BRonchiectasis - Dec 2010.New Rx:  outpatient - Feb 2011 - Rx outpatient  . CHF (congestive heart failure) ([place])   . COPD (chronic obstructive pulmonary disease) (HCC)    bronchiectasis  . Cricopharyngeal achalasia   . Depression   . Diverticulosis   . Dyslipidemia   . Eczema   . GERD with stricture   . Glaucoma   . H. pylori infection   . Hypertension   . Hyponatremia    chronic, s/p endo eval 06/2012  . Hypothyroid   . IBS (irritable bowel syndrome)   . Lumbar disc disease   . Neuropathy of both feet   . Segmental colitis ([place])   . Vertigo   . Wears glasses     Assessment: 86 y.o. female with medical history significant of A. fib on Coumadin, chronic diastolic heart failure, hypertension, probable ILD, hypothyroidism, hyperlipidemia, depression presented with a fall. Pt now p/o right hip bipolar hemiarthroplasty pharmacy consulted to resume warfarin.    - Home dose 1.25mg  on M,W, F and 2.5mg  all other days.  LD 4/26 @ 2200   10/29/2019  INR subtherapeutic and unchanged - received 2.5mg  warfarin last PM  Hgb 10.7, Plts 171  No reported bleeding  Goal of Therapy:  INR 2-3   Plan:  Repeat warfarin 2.5mg  this PM  Daily INR   Adrian Saran, PharmD, BCPS 10/29/2019 10:48 AM

## 2019-10-29 NOTE — Progress Notes (Signed)
Inpatient Rehabilitation-Admissions Coordinator   Received call from pt's daughter requesting information about CIR. Noted my coworker Mazlyn Russin has already screened this patient and is asking for clarification from therapies for post acute rehab venue. After speaking with pt's family during screening process, feel this patient warrants a full consult assessment. Will place order for consult per our protocol.   Raechel Ache, OTR/L  Rehab Admissions Coordinator  [phone] 10/29/2019 5:40 PM

## 2019-10-29 NOTE — Evaluation (Signed)
Physical Therapy Evaluation Patient Details Name: Stephanie Cortez MRN: [id number] DOB: 04-27-1934 Today's Date: 10/29/2019   History of Present Illness  Patient is a 86 year old female admitted after a fall with closed femoral neck fracture. Pt is s/p R hemiarthoplasty.    Clinical Impression  Pt s/p R hip hemi-arthroplasty following fall and hip fx.  Pt presents with decreased R LE strength/ROM, post op pain, and posterior THP limiting functional mobility .  Pt would greatly benefit from follow inpt rehab to maximize IND and safety prior to return home.    Follow Up Recommendations CIR    Equipment Recommendations  None recommended by PT    Recommendations for Other Services       Precautions / Restrictions Precautions Precautions: Posterior Hip Precaution Booklet Issued: Yes (comment) Restrictions Weight Bearing Restrictions: No RLE Weight Bearing: Weight bearing as tolerated      Mobility  Bed Mobility Overal bed mobility: Needs Assistance Bed Mobility: Supine to Sit     Supine to sit: Mod assist;Max assist;HOB elevated     General bed mobility comments: Increased time.  pt requires support of R LE while mobilizing towards EOB and mod A for trunk elevation   Transfers Overall transfer level: Needs assistance Equipment used: Rolling walker (2 wheeled) Transfers: Sit to/from Stand Sit to Stand: Min assist;Mod assist;+2 physical assistance;+2 safety/equipment;From elevated surface         General transfer comment: max cues for sequencing, proper hand placement and body mechanics  Ambulation/Gait Ambulation/Gait assistance: Min assist;Mod assist;+2 physical assistance;+2 safety/equipment Gait Distance (Feet): 4 Feet Assistive device: Rolling walker (2 wheeled) Gait Pattern/deviations: Step-to pattern;Decreased step length - right;Decreased step length - left;Shuffle;Trunk flexed Gait velocity: decr   General Gait Details: Step-be-step cues for sequence, foot  placement and UE WB.    Stairs            Wheelchair Mobility    Modified Rankin (Stroke Patients Only)       Balance Overall balance assessment: Needs assistance Sitting-balance support: No upper extremity supported;Feet supported Sitting balance-Leahy Scale: Good     Standing balance support: Bilateral upper extremity supported;During functional activity Standing balance-Leahy Scale: Poor Standing balance comment: reliant on external support                              Pertinent Vitals/Pain Pain Assessment: Faces Faces Pain Scale: Hurts even more Pain Location: R hip with mobility Pain Descriptors / Indicators: Grimacing;Discomfort;Operative site guarding Pain Intervention(s): Limited activity within patient's tolerance;Monitored during session;Premedicated before session;Ice applied    Home Living Family/patient expects to be discharged to:: Private residence Living Arrangements: Alone Available Help at Discharge: Family Type of Home: House Home Access: Stairs to enter Entrance Stairs-Rails: None Entrance Stairs-Number of Steps: 2 front, 3 in back Home Layout: One level Home Equipment: Grab bars - tub/shower;Shower seat - built in      Prior Function Level of Independence: Independent         Comments: still drives     Hand Dominance   Dominant Hand: Right    Extremity/Trunk Assessment   Upper Extremity Assessment Upper Extremity Assessment: Generalized weakness    Lower Extremity Assessment Lower Extremity Assessment: Generalized weakness;RLE deficits/detail    Cervical / Trunk Assessment Cervical / Trunk Assessment: Kyphotic  Communication   Communication: No difficulties  Cognition Arousal/Alertness: Awake/alert Behavior During Therapy: WFL for tasks assessed/performed Overall Cognitive Status: Within Functional Limits for tasks  assessed                                 General Comments: some difficulty with  sequencing however suspect due to post op day 1/medications      General Comments General comments (skin integrity, edema, etc.): pt desaturate on room air to mid 80s therefore donned prior to bed mobility.     Exercises     Assessment/Plan    PT Assessment Patient needs continued PT services  PT Problem List Decreased strength;Decreased range of motion;Decreased activity tolerance;Decreased balance;Decreased mobility;Decreased cognition;Decreased knowledge of use of DME;Pain;Decreased knowledge of precautions       PT Treatment Interventions DME instruction;Gait training;Stair training;Functional mobility training;Therapeutic activities;Therapeutic exercise;Patient/family education    PT Goals (Current goals can be found in the Care Plan section)  Acute Rehab PT Goals Patient Stated Goal: "to see what I can do" PT Goal Formulation: With patient Time For Goal Achievement: 11/12/19 Potential to Achieve Goals: Fair    Frequency Min 5X/week   Barriers to discharge Decreased caregiver support Pt lives alone    Co-evaluation PT/OT/SLP Co-Evaluation/Treatment: Yes Reason for Co-Treatment: For patient/therapist safety PT goals addressed during session: Mobility/safety with mobility OT goals addressed during session: ADL's and self-care       AM-PAC PT "6 Clicks" Mobility  Outcome Measure Help needed turning from your back to your side while in a flat bed without using bedrails?: A Lot Help needed moving from lying on your back to sitting on the side of a flat bed without using bedrails?: A Lot Help needed moving to and from a bed to a chair (including a wheelchair)?: A Lot Help needed standing up from a chair using your arms (e.g., wheelchair or bedside chair)?: A Lot Help needed to walk in hospital room?: A Lot Help needed climbing 3-5 steps with a railing? : Total 6 Click Score: 11    End of Session Equipment Utilized During Treatment: Gait belt Activity Tolerance:  Patient tolerated treatment well;Patient limited by fatigue Patient left: in chair;with call bell/phone within reach;with family/visitor present Nurse Communication: Mobility status PT Visit Diagnosis: Difficulty in walking, not elsewhere classified (R26.2)    Time: 0810-0850 PT Time Calculation (min) (ACUTE ONLY): 40 min   Charges:   PT Evaluation $PT Eval Low Complexity: 1 Low PT Treatments $Gait Training: 8-22 mins        [place] Pager [phone] Office [phone]   Gearldene Fiorenza 10/29/2019, 2:42 PM

## 2019-10-29 NOTE — Plan of Care (Signed)
  Problem: Education: Goal: Verbalization of understanding the information provided (i.e., activity precautions, restrictions, etc) will improve Outcome: Progressing Goal: Individualized Educational Video(s) Outcome: Progressing   Problem: Activity: Goal: Ability to ambulate and perform ADLs will improve Outcome: Progressing   Problem: Self-Concept: Goal: Ability to maintain and perform role responsibilities to the fullest extent possible will improve Outcome: Progressing   Problem: Pain Management: Goal: Pain level will decrease Outcome: Progressing   Problem: Education: Goal: Knowledge of General Education information will improve Description: Including pain rating scale, medication(s)/side effects and non-pharmacologic comfort measures Outcome: Progressing   Problem: Health Behavior/Discharge Planning: Goal: Ability to manage health-related needs will improve Outcome: Progressing   Problem: Clinical Measurements: Goal: Ability to maintain clinical measurements within normal limits will improve Outcome: Progressing Goal: Will remain free from infection Outcome: Progressing Goal: Diagnostic test results will improve Outcome: Progressing Goal: Respiratory complications will improve Outcome: Progressing Goal: Cardiovascular complication will be avoided Outcome: Progressing   Problem: Activity: Goal: Risk for activity intolerance will decrease Outcome: Progressing   Problem: Nutrition: Goal: Adequate nutrition will be maintained Outcome: Progressing   Problem: Coping: Goal: Level of anxiety will decrease Outcome: Progressing   Problem: Elimination: Goal: Will not experience complications related to bowel motility Outcome: Progressing Goal: Will not experience complications related to urinary retention Outcome: Progressing   Problem: Pain Managment: Goal: General experience of comfort will improve Outcome: Progressing   Problem: Safety: Goal: Ability to  remain free from injury will improve Outcome: Progressing   Problem: Skin Integrity: Goal: Risk for impaired skin integrity will decrease Outcome: Progressing   

## 2019-10-30 ENCOUNTER — Other Ambulatory Visit: Payer: Self-pay

## 2019-10-30 ENCOUNTER — Encounter (HOSPITAL_COMMUNITY): Payer: Self-pay | Admitting: Physical Medicine & Rehabilitation

## 2019-10-30 ENCOUNTER — Inpatient Hospital Stay (HOSPITAL_COMMUNITY)
Admission: RE | Admit: 2019-10-30 | Discharge: 2019-11-16 | DRG: 560 | Disposition: A | Discharge status: Other Acute Inpatient Hospital | Payer: Medicare Other | Source: Intra-hospital | Attending: Physical Medicine & Rehabilitation | Admitting: Physical Medicine & Rehabilitation

## 2019-10-30 DIAGNOSIS — Z880 Allergy status to penicillin: Secondary | ICD-10-CM

## 2019-10-30 DIAGNOSIS — I10 Essential (primary) hypertension: Secondary | ICD-10-CM

## 2019-10-30 DIAGNOSIS — E039 Hypothyroidism, unspecified: Secondary | ICD-10-CM | POA: Diagnosis present

## 2019-10-30 DIAGNOSIS — I4891 Unspecified atrial fibrillation: Secondary | ICD-10-CM | POA: Diagnosis present

## 2019-10-30 DIAGNOSIS — D62 Acute posthemorrhagic anemia: Secondary | ICD-10-CM | POA: Diagnosis not present

## 2019-10-30 DIAGNOSIS — Z7951 Long term (current) use of inhaled steroids: Secondary | ICD-10-CM

## 2019-10-30 DIAGNOSIS — Z96649 Presence of unspecified artificial hip joint: Secondary | ICD-10-CM | POA: Diagnosis not present

## 2019-10-30 DIAGNOSIS — D72829 Elevated white blood cell count, unspecified: Secondary | ICD-10-CM | POA: Diagnosis not present

## 2019-10-30 DIAGNOSIS — F329 Major depressive disorder, single episode, unspecified: Secondary | ICD-10-CM | POA: Diagnosis present

## 2019-10-30 DIAGNOSIS — J479 Bronchiectasis, uncomplicated: Secondary | ICD-10-CM | POA: Diagnosis not present

## 2019-10-30 DIAGNOSIS — I110 Hypertensive heart disease with heart failure: Secondary | ICD-10-CM | POA: Diagnosis not present

## 2019-10-30 DIAGNOSIS — R0989 Other specified symptoms and signs involving the circulatory and respiratory systems: Secondary | ICD-10-CM | POA: Diagnosis not present

## 2019-10-30 DIAGNOSIS — Z8249 Family history of ischemic heart disease and other diseases of the circulatory system: Secondary | ICD-10-CM

## 2019-10-30 DIAGNOSIS — S72011S Unspecified intracapsular fracture of right femur, sequela: Secondary | ICD-10-CM | POA: Diagnosis not present

## 2019-10-30 DIAGNOSIS — Z7901 Long term (current) use of anticoagulants: Secondary | ICD-10-CM

## 2019-10-30 DIAGNOSIS — E46 Unspecified protein-calorie malnutrition: Secondary | ICD-10-CM | POA: Diagnosis not present

## 2019-10-30 DIAGNOSIS — K220 Achalasia of cardia: Secondary | ICD-10-CM | POA: Diagnosis present

## 2019-10-30 DIAGNOSIS — K219 Gastro-esophageal reflux disease without esophagitis: Secondary | ICD-10-CM | POA: Diagnosis not present

## 2019-10-30 DIAGNOSIS — Z9889 Other specified postprocedural states: Secondary | ICD-10-CM

## 2019-10-30 DIAGNOSIS — Z841 Family history of disorders of kidney and ureter: Secondary | ICD-10-CM

## 2019-10-30 DIAGNOSIS — E785 Hyperlipidemia, unspecified: Secondary | ICD-10-CM | POA: Diagnosis present

## 2019-10-30 DIAGNOSIS — Z79899 Other long term (current) drug therapy: Secondary | ICD-10-CM | POA: Diagnosis not present

## 2019-10-30 DIAGNOSIS — Z888 Allergy status to other drugs, medicaments and biological substances status: Secondary | ICD-10-CM | POA: Diagnosis not present

## 2019-10-30 DIAGNOSIS — E871 Hypo-osmolality and hyponatremia: Secondary | ICD-10-CM

## 2019-10-30 DIAGNOSIS — D72828 Other elevated white blood cell count: Secondary | ICD-10-CM | POA: Diagnosis present

## 2019-10-30 DIAGNOSIS — R7989 Other specified abnormal findings of blood chemistry: Secondary | ICD-10-CM | POA: Diagnosis not present

## 2019-10-30 DIAGNOSIS — S72121A Displaced fracture of lesser trochanter of right femur, initial encounter for closed fracture: Secondary | ICD-10-CM | POA: Diagnosis not present

## 2019-10-30 DIAGNOSIS — K5903 Drug induced constipation: Secondary | ICD-10-CM | POA: Diagnosis not present

## 2019-10-30 DIAGNOSIS — I5032 Chronic diastolic (congestive) heart failure: Secondary | ICD-10-CM

## 2019-10-30 DIAGNOSIS — R55 Syncope and collapse: Secondary | ICD-10-CM

## 2019-10-30 DIAGNOSIS — Z823 Family history of stroke: Secondary | ICD-10-CM

## 2019-10-30 DIAGNOSIS — W010XXD Fall on same level from slipping, tripping and stumbling without subsequent striking against object, subsequent encounter: Secondary | ICD-10-CM | POA: Diagnosis present

## 2019-10-30 DIAGNOSIS — S72001D Fracture of unspecified part of neck of right femur, subsequent encounter for closed fracture with routine healing: Secondary | ICD-10-CM | POA: Diagnosis not present

## 2019-10-30 DIAGNOSIS — Z9981 Dependence on supplemental oxygen: Secondary | ICD-10-CM | POA: Diagnosis not present

## 2019-10-30 DIAGNOSIS — I4821 Permanent atrial fibrillation: Secondary | ICD-10-CM

## 2019-10-30 DIAGNOSIS — J849 Interstitial pulmonary disease, unspecified: Secondary | ICD-10-CM | POA: Diagnosis present

## 2019-10-30 DIAGNOSIS — Z825 Family history of asthma and other chronic lower respiratory diseases: Secondary | ICD-10-CM | POA: Diagnosis not present

## 2019-10-30 DIAGNOSIS — S72001A Fracture of unspecified part of neck of right femur, initial encounter for closed fracture: Secondary | ICD-10-CM

## 2019-10-30 DIAGNOSIS — R7401 Elevation of levels of liver transaminase levels: Secondary | ICD-10-CM | POA: Diagnosis not present

## 2019-10-30 DIAGNOSIS — M978XXA Periprosthetic fracture around other internal prosthetic joint, initial encounter: Secondary | ICD-10-CM

## 2019-10-30 DIAGNOSIS — J189 Pneumonia, unspecified organism: Secondary | ICD-10-CM | POA: Diagnosis not present

## 2019-10-30 DIAGNOSIS — J449 Chronic obstructive pulmonary disease, unspecified: Secondary | ICD-10-CM | POA: Diagnosis not present

## 2019-10-30 DIAGNOSIS — G8918 Other acute postprocedural pain: Secondary | ICD-10-CM

## 2019-10-30 DIAGNOSIS — Z881 Allergy status to other antibiotic agents status: Secondary | ICD-10-CM

## 2019-10-30 DIAGNOSIS — R52 Pain, unspecified: Secondary | ICD-10-CM

## 2019-10-30 DIAGNOSIS — S72019A Unspecified intracapsular fracture of unspecified femur, initial encounter for closed fracture: Secondary | ICD-10-CM | POA: Diagnosis present

## 2019-10-30 DIAGNOSIS — I482 Chronic atrial fibrillation, unspecified: Secondary | ICD-10-CM | POA: Diagnosis not present

## 2019-10-30 DIAGNOSIS — E8809 Other disorders of plasma-protein metabolism, not elsewhere classified: Secondary | ICD-10-CM | POA: Diagnosis not present

## 2019-10-30 LAB — CBC
HCT: 34.7 % — ABNORMAL LOW (ref 36.0–46.0)
Hemoglobin: 10.7 g/dL — ABNORMAL LOW (ref 12.0–15.0)
MCH: 30.3 pg (ref 26.0–34.0)
MCHC: 30.8 g/dL (ref 30.0–36.0)
MCV: 98.3 fL (ref 80.0–100.0)
Platelets: 178 10*3/uL (ref 150–400)
RBC: 3.53 MIL/uL — ABNORMAL LOW (ref 3.87–5.11)
RDW: 13.6 % (ref 11.5–15.5)
WBC: 14.5 10*3/uL — ABNORMAL HIGH (ref 4.0–10.5)
nRBC: 0 % (ref 0.0–0.2)

## 2019-10-30 LAB — BASIC METABOLIC PANEL
Anion gap: 10 (ref 5–15)
BUN: 34 mg/dL — ABNORMAL HIGH (ref 8–23)
CO2: 21 mmol/L — ABNORMAL LOW (ref 22–32)
Calcium: 8.4 mg/dL — ABNORMAL LOW (ref 8.9–10.3)
Chloride: 97 mmol/L — ABNORMAL LOW (ref 98–111)
Creatinine, Ser: 1 mg/dL (ref 0.44–1.00)
GFR calc Af Amer: 60 mL/min — ABNORMAL LOW (ref >60)
GFR calc non Af Amer: 52 mL/min — ABNORMAL LOW (ref >60)
Glucose, Bld: 125 mg/dL — ABNORMAL HIGH (ref 70–99)
Potassium: 4.1 mmol/L (ref 3.5–5.1)
Sodium: 128 mmol/L — ABNORMAL LOW (ref 135–145)

## 2019-10-30 LAB — PROTIME-INR
INR: 2.3 — ABNORMAL HIGH (ref 0.8–1.2)
Prothrombin Time: 24.9 seconds — ABNORMAL HIGH (ref 11.4–15.2)

## 2019-10-30 MED ORDER — BISOPROLOL FUMARATE 10 MG PO TABS
10.0000 mg | ORAL_TABLET | Freq: Every day | ORAL | Status: DC
  Administered 2019-10-31 – 2019-11-02 (×3): 10 mg via ORAL
  Filled 2019-10-30 (×3): qty 1

## 2019-10-30 MED ORDER — PROCHLORPERAZINE 25 MG RE SUPP: 12.5000 mg | Freq: Four times a day (QID) | RECTAL | Status: DC | PRN

## 2019-10-30 MED ORDER — SENNA 8.6 MG PO TABS
2.0000 | ORAL_TABLET | Freq: Every day | ORAL | Status: DC
  Administered 2019-10-30 – 2019-11-01 (×3): 17.2 mg via ORAL
  Filled 2019-10-30 (×3): qty 2

## 2019-10-30 MED ORDER — HYDROCODONE-ACETAMINOPHEN 7.5-325 MG PO TABS
1.0000 | ORAL_TABLET | ORAL | Status: DC | PRN
  Administered 2019-10-31 – 2019-11-01 (×2): 2 via ORAL
  Administered 2019-11-02: 1 via ORAL
  Filled 2019-10-30 (×3): qty 2
  Filled 2019-10-30: qty 1
  Filled 2019-10-30: qty 2

## 2019-10-30 MED ORDER — ONDANSETRON HCL 4 MG PO TABS
4.0000 mg | ORAL_TABLET | Freq: Four times a day (QID) | ORAL | Status: DC | PRN
  Filled 2019-10-30: qty 1

## 2019-10-30 MED ORDER — DILTIAZEM HCL ER COATED BEADS 240 MG PO CP24
240.0000 mg | ORAL_CAPSULE | Freq: Every day | ORAL | Status: DC
  Administered 2019-10-30 – 2019-11-01 (×3): 240 mg via ORAL
  Filled 2019-10-30 (×4): qty 1

## 2019-10-30 MED ORDER — TRAZODONE HCL 50 MG PO TABS
25.0000 mg | ORAL_TABLET | Freq: Every evening | ORAL | Status: DC | PRN
  Administered 2019-11-10: 23:00:00 25 mg via ORAL
  Filled 2019-10-30: qty 1

## 2019-10-30 MED ORDER — WARFARIN 0.5 MG HALF TABLET
0.5000 mg | ORAL_TABLET | Freq: Once | ORAL | Status: DC
  Filled 2019-10-30: qty 1

## 2019-10-30 MED ORDER — GUAIFENESIN-DM 100-10 MG/5ML PO SYRP
5.0000 mL | ORAL_SOLUTION | Freq: Four times a day (QID) | ORAL | Status: DC | PRN
  Administered 2019-11-05 (×2): 10 mL via ORAL
  Filled 2019-10-30 (×2): qty 10

## 2019-10-30 MED ORDER — DIPHENHYDRAMINE HCL 12.5 MG/5ML PO ELIX: 12.5000 mg | ORAL_SOLUTION | Freq: Four times a day (QID) | ORAL | Status: DC | PRN

## 2019-10-30 MED ORDER — WARFARIN - PHARMACIST DOSING INPATIENT: Freq: Every day | Status: DC

## 2019-10-30 MED ORDER — MENTHOL 3 MG MT LOZG
1.0000 | LOZENGE | OROMUCOSAL | Status: DC | PRN
  Filled 2019-10-30: qty 9

## 2019-10-30 MED ORDER — ONDANSETRON HCL 4 MG/2ML IJ SOLN: 4.0000 mg | Freq: Four times a day (QID) | INTRAMUSCULAR | Status: DC | PRN

## 2019-10-30 MED ORDER — FLEET ENEMA 7-19 GM/118ML RE ENEM: 1.0000 | ENEMA | Freq: Once | RECTAL | Status: DC | PRN

## 2019-10-30 MED ORDER — PRAVASTATIN SODIUM 40 MG PO TABS
40.0000 mg | ORAL_TABLET | Freq: Every day | ORAL | Status: DC
  Administered 2019-10-30 – 2019-11-15 (×17): 40 mg via ORAL
  Filled 2019-10-30 (×17): qty 1

## 2019-10-30 MED ORDER — ALUM & MAG HYDROXIDE-SIMETH 200-200-20 MG/5ML PO SUSP: 30.0000 mL | ORAL | Status: DC | PRN

## 2019-10-30 MED ORDER — PHENOL 1.4 % MT LIQD: 1.0000 | OROMUCOSAL | Status: DC | PRN

## 2019-10-30 MED ORDER — ACETAMINOPHEN 325 MG PO TABS: 325.0000 mg | ORAL_TABLET | ORAL | Status: DC | PRN

## 2019-10-30 MED ORDER — FLUOXETINE HCL 10 MG PO CAPS: 10.0000 mg | ORAL_CAPSULE | Freq: Two times a day (BID) | ORAL | Status: DC | PRN

## 2019-10-30 MED ORDER — DOCUSATE SODIUM 100 MG PO CAPS: 100.0000 mg | ORAL_CAPSULE | Freq: Two times a day (BID) | ORAL | 0 refills | Status: DC

## 2019-10-30 MED ORDER — FLUTICASONE PROPIONATE 50 MCG/ACT NA SUSP
1.0000 | Freq: Two times a day (BID) | NASAL | Status: DC | PRN
  Filled 2019-10-30: qty 16

## 2019-10-30 MED ORDER — MOMETASONE FURO-FORMOTEROL FUM 100-5 MCG/ACT IN AERO
2.0000 | INHALATION_SPRAY | Freq: Two times a day (BID) | RESPIRATORY_TRACT | Status: DC
  Administered 2019-10-30 – 2019-11-15 (×32): 2 via RESPIRATORY_TRACT
  Filled 2019-10-30: qty 8.8

## 2019-10-30 MED ORDER — PROCHLORPERAZINE EDISYLATE 10 MG/2ML IJ SOLN: 5.0000 mg | Freq: Four times a day (QID) | INTRAMUSCULAR | Status: DC | PRN

## 2019-10-30 MED ORDER — TRAMADOL HCL 50 MG PO TABS
50.0000 mg | ORAL_TABLET | Freq: Four times a day (QID) | ORAL | Status: DC
  Administered 2019-10-30 – 2019-11-02 (×12): 50 mg via ORAL
  Filled 2019-10-30 (×11): qty 1

## 2019-10-30 MED ORDER — POLYETHYLENE GLYCOL 3350 17 G PO PACK
17.0000 g | PACK | Freq: Every day | ORAL | Status: DC | PRN
  Administered 2019-10-31: 17 g via ORAL
  Filled 2019-10-30: qty 1

## 2019-10-30 MED ORDER — IRBESARTAN 300 MG PO TABS
300.0000 mg | ORAL_TABLET | Freq: Every day | ORAL | Status: DC
  Administered 2019-10-31 – 2019-11-02 (×3): 300 mg via ORAL
  Filled 2019-10-30 (×3): qty 1

## 2019-10-30 MED ORDER — PROCHLORPERAZINE MALEATE 5 MG PO TABS: 5.0000 mg | ORAL_TABLET | Freq: Four times a day (QID) | ORAL | Status: DC | PRN

## 2019-10-30 MED ORDER — METHOCARBAMOL 500 MG PO TABS
500.0000 mg | ORAL_TABLET | Freq: Four times a day (QID) | ORAL | Status: DC | PRN
  Administered 2019-11-01 – 2019-11-09 (×15): 500 mg via ORAL
  Filled 2019-10-30 (×16): qty 1

## 2019-10-30 MED ORDER — WARFARIN 0.5 MG HALF TABLET
0.5000 mg | ORAL_TABLET | Freq: Once | ORAL | Status: AC
  Administered 2019-10-30: 0.5 mg via ORAL
  Filled 2019-10-30: qty 1

## 2019-10-30 MED ORDER — BISACODYL 10 MG RE SUPP
10.0000 mg | Freq: Every day | RECTAL | Status: DC | PRN
  Administered 2019-10-31: 10 mg via RECTAL
  Filled 2019-10-30 (×3): qty 1

## 2019-10-30 MED ORDER — FUROSEMIDE 20 MG PO TABS: 20.0000 mg | ORAL_TABLET | Freq: Every day | ORAL | Status: DC | PRN

## 2019-10-30 MED ORDER — DOCUSATE SODIUM 100 MG PO CAPS: 100.0000 mg | ORAL_CAPSULE | Freq: Two times a day (BID) | ORAL | Status: DC

## 2019-10-30 MED ORDER — LEVOTHYROXINE SODIUM 50 MCG PO TABS
50.0000 ug | ORAL_TABLET | Freq: Every day | ORAL | Status: DC
  Administered 2019-10-31 – 2019-11-16 (×17): 50 ug via ORAL
  Filled 2019-10-30 (×17): qty 1

## 2019-10-30 NOTE — Progress Notes (Signed)
Physical Therapy Treatment Patient Details Name: Stephanie Cortez MRN: [id number] DOB: [birth year] Today's Date: 10/30/2019    History of Present Illness Patient is a 86 year old female admitted after a fall with closed femoral neck fracture. Pt is s/p R hemiarthoplasty.      PT Comments    Pt continues motivated and with noted improvement in quality of all tasks performed and with decreased physical assist required.   Follow Up Recommendations  CIR     Equipment Recommendations  None recommended by PT    Recommendations for Other Services       Precautions / Restrictions Precautions Precautions: Posterior Hip Precaution Booklet Issued: Yes (comment) Precaution Comments: Pt able to recall 1/3 THP without cues.  All THP reviewed Restrictions Weight Bearing Restrictions: No RLE Weight Bearing: Weight bearing as tolerated    Mobility  Bed Mobility Overal bed mobility: Needs Assistance Bed Mobility: Supine to Sit     Supine to sit: Mod assist;+2 for physical assistance;+2 for safety/equipment     General bed mobility comments: Increased time with cues for sequence and physical assist to manage R LE and control trunk  Transfers Overall transfer level: Needs assistance Equipment used: Rolling walker (2 wheeled) Transfers: Sit to/from Stand Sit to Stand: Min assist;Mod assist;+2 physical assistance;+2 safety/equipment;From elevated surface         General transfer comment: max cues for sequencing, proper hand placement and body mechanics  Ambulation/Gait Ambulation/Gait assistance: Min assist;Mod assist;+2 safety/equipment Gait Distance (Feet): 24 Feet Assistive device: Rolling walker (2 wheeled) Gait Pattern/deviations: Step-to pattern;Decreased step length - right;Decreased step length - left;Shuffle;Trunk flexed Gait velocity: decr   General Gait Details: Step-be-step cues for sequence, foot placement and UE WB.     Stairs             Wheelchair  Mobility    Modified Rankin (Stroke Patients Only)       Balance Overall balance assessment: Needs assistance Sitting-balance support: No upper extremity supported;Feet supported Sitting balance-Leahy Scale: Good     Standing balance support: Bilateral upper extremity supported;During functional activity Standing balance-Leahy Scale: Poor Standing balance comment: reliant on external support                             Cognition Arousal/Alertness: Awake/alert Behavior During Therapy: WFL for tasks assessed/performed Overall Cognitive Status: Within Functional Limits for tasks assessed                                 General Comments: some difficulty with sequencing however suspect due to post op day 1/medications      Exercises Total Joint Exercises Ankle Circles/Pumps: AROM;Both;15 reps;Supine Quad Sets: AROM;Both;10 reps;Supine Heel Slides: AAROM;Right;20 reps;Supine Hip ABduction/ADduction: AAROM;Right;15 reps;Supine    General Comments        Pertinent Vitals/Pain Pain Assessment: Faces Faces Pain Scale: Hurts little more Pain Location: R hip with mobility Pain Descriptors / Indicators: Grimacing;Discomfort;Operative site guarding Pain Intervention(s): Limited activity within patient's tolerance;Monitored during session;Premedicated before session;Ice applied    Home Living                      Prior Function            PT Goals (current goals can now be found in the care plan section) Acute Rehab PT Goals Patient Stated Goal: Regain IND PT Goal Formulation:  With patient Time For Goal Achievement: 11/12/19 Potential to Achieve Goals: Fair Progress towards PT goals: Progressing toward goals    Frequency    Min 5X/week      PT Plan Current plan remains appropriate    Co-evaluation              AM-PAC PT "6 Clicks" Mobility   Outcome Measure  Help needed turning from your back to your side while in a  flat bed without using bedrails?: A Lot Help needed moving from lying on your back to sitting on the side of a flat bed without using bedrails?: A Lot Help needed moving to and from a bed to a chair (including a wheelchair)?: A Lot Help needed standing up from a chair using your arms (e.g., wheelchair or bedside chair)?: A Lot Help needed to walk in hospital room?: A Lot Help needed climbing 3-5 steps with a railing? : Total 6 Click Score: 11    End of Session Equipment Utilized During Treatment: Gait belt Activity Tolerance: Patient tolerated treatment well Patient left: with call bell/phone within reach;with family/visitor present;in chair;with chair alarm set Nurse Communication: Mobility status PT Visit Diagnosis: Difficulty in walking, not elsewhere classified (R26.2)     Time: [id number] PT Time Calculation (min) (ACUTE ONLY): 38 min  Charges:  $Gait Training: 8-22 mins $Therapeutic Exercise: 8-22 mins $Therapeutic Activity: 8-22 mins                     [place] Pager [phone] Office [phone]    Dontaye Hur 10/30/2019, 12:49 PM

## 2019-10-30 NOTE — TOC Transition Note (Signed)
Transition of Care [facility]) - CM/SW Discharge Note   Patient Details  Name: EDY ATCHESON MRN: [id number] Date of Birth: 02/08/1934  Transition of Care [facility]) CM/SW Contact:  Lia Hopping, [place] Phone Number: 10/30/2019, 1:09 PM   Clinical Narrative:    CIR accepted. Patient will discharge today. CIR will coordinate discharge.    Final next level of care: IP Rehab Facility Barriers to Discharge: Barriers Resolved   Patient Goals and CMS Choice   CMS Medicare.gov Compare Post Acute Care list provided to:: Patient Choice offered to / list presented to : Patient, Adult Children  Discharge Placement                       Discharge Plan and Services In-house Referral: Clinical Social Work Discharge Planning Services: NA Post Acute Care Choice: [place]          DME Arranged: N/A DME Agency: NA       HH Arranged: NA HH Agency: NA        Social Determinants of Health (SDOH) Interventions     Readmission Risk Interventions No flowsheet data found.

## 2019-10-30 NOTE — Telephone Encounter (Signed)
Daughter Stephanie Cortez called back. We have canceled appointment for 5/4 with MR since she is going to inpatient rehab. Daughter is going to talk to facility and get their policy and procedures for while her mother is there and will call the office back to reschedule appointment and update [place]. When she calls back to reschedule patient will need to be scheduled for 30 min per MR.  Nothing further needed at this time.

## 2019-10-30 NOTE — Progress Notes (Signed)
ANTICOAGULATION CONSULT NOTE - Follow Up Consult  Pharmacy Consult for warfarin Indication: atrial fibrillation, s/p R THA  Allergies  Allergen Reactions  . Doxycycline     Rash on face and neck  . Flagyl [Metronidazole] Nausea And Vomiting  . Moxifloxacin Other (See Comments)     Weakness/fatigue  . Pantoprazole Sodium Rash  . Penicillins Rash    Has patient had a PCN reaction causing immediate rash, facial/tongue/throat swelling, SOB or lightheadedness with hypotension:unsure Has patient had a PCN reaction causing severe rash involving mucus membranes or skin necrosis:unsure Has patient had a PCN reaction that required hospitalization:No Has patient had a PCN reaction occurring within the last 10 years:Yes Cannot recall exact reaction due to time lapse If all of the above answers are "NO", then may proceed with Cephalosporin use.     Patient Measurements: Height: 5\' 6"  (167.6 cm) Weight: 86.7 kg (191 lb 2.2 oz) IBW/kg (Calculated) : 59.3   Vital Signs: Temp: 97.7 F (36.5 C) (04/30 0551) Temp Source: Oral (04/30 0551) BP: 129/63 (04/30 0551) Pulse Rate: 57 (04/30 0551)  Labs: Recent Labs    10/28/19 0250 10/28/19 0250 10/29/19 0309 10/30/19 0313  HGB 11.7*   < > 10.7* 10.7*  HCT 37.4  --  35.7* 34.7*  PLT 206  --  171 178  LABPROT 18.6*  --  18.9* 24.9*  INR 1.6*  --  1.6* 2.3*  CREATININE 0.66  --  0.69 1.00   < > = values in this interval not displayed.    Estimated Creatinine Clearance: 46.5 mL/min (by C-G formula based on SCr of 1 mg/dL).   Medical History: Past Medical History:  Diagnosis Date  . Anemia    - Hgb 9.7gm% on 07/13/2008 in [place] -  Hgg 129gm% wiht normal irone levsl and ferritin 10/27/2008 in [place] Recurrent otitis/sinusitis  . Anxiety    chronic BZ prn  . Atrial fibrillation (HCC)    chronic anticoag  . Bronchiectasis    >PFT 07/13/2008 in [place] 1.9L/76%, FVC 2.45L/74, Ratio 79, TLC 121%, DLCO 64%  AE BRonchiectasis - Dec  2010.New Rx:  outpatient - Feb 2011 - Rx outpatient  . CHF (congestive heart failure) ([place])   . COPD (chronic obstructive pulmonary disease) (HCC)    bronchiectasis  . Cricopharyngeal achalasia   . Depression   . Diverticulosis   . Dyslipidemia   . Eczema   . GERD with stricture   . Glaucoma   . H. pylori infection   . Hypertension   . Hyponatremia    chronic, s/p endo eval 06/2012  . Hypothyroid   . IBS (irritable bowel syndrome)   . Lumbar disc disease   . Neuropathy of both feet   . Segmental colitis ([place])   . Vertigo   . Wears glasses     Assessment: 73 yoF with PMH significant for HF, HTN, probable ILD. Pt on warfarin PTA for hx of atrial fibrillation. Pt admitted s/p fall, underwent right hip arthroplasty on 4/28. Pharmacy consulted to resume warfarin dosing on 4/28 post-op.   Home dose: Warfarin 1.25mg  on M,W, F and 2.5mg  all other days.   LD 4/26 PTA @ 2200   10/30/2019  CBC: Hgb (10.7) slightly low but stable post-op. Plt WNL & stable  INR 2.3 is therapeutic today, increased from yesterday  No documented bleeding issues  No significant DDI  100% of meal charted as eaten yesterday. Albumin 3.4  Goal of Therapy:  INR 2-3   Plan:  Warfarin 0.5 mg PO once, give reduced dose today given INR increase today  INR daily, follow CBC. Monitor for signs of bleeding  Lenis Noon, PharmD 10/30/19 12:48 PM

## 2019-10-30 NOTE — Telephone Encounter (Signed)
ATC daughter [name] [phone] to touch base with her about message from MR. Looks like patient is at [facility] long waiting to go to [facility] inpatient rehab. MR wants her to have follow up appointment in 30 min slot but may have to wait till she is discharged from rehab. She also has appointment scheduled with him for Tuesday 5/4 this may need to be canceled/resceduled. Will wait for daughter to call back.

## 2019-10-30 NOTE — IPOC Note (Signed)
Individualized overall Plan of Care [facility]) Patient Details Name: Stephanie Cortez MRN: [id number] DOB: 30-May-1934  Admitting Diagnosis: Subcapital fracture of femur [facility])  Hospital Problems: Principal Problem:   Subcapital fracture of femur ([place]) Active Problems:   Acute blood loss anemia   Transaminitis   Hypoalbuminemia due to protein-calorie malnutrition (HCC)   Labile blood pressure   Drug induced constipation   Supplemental oxygen dependent   Postoperative pain     Functional Problem List: Nursing Bladder, Bowel, Motor, Endurance, Pain, Skin Integrity, Safety  PT Balance, Edema, Endurance, Pain, Safety  OT Balance, Endurance, Motor, Pain, Perception, Safety, Skin Integrity, Sensory, Edema  SLP    TR         Basic ADL's: OT Grooming, Bathing, Dressing, Toileting     Advanced  ADL's: OT       Transfers: PT Bed Mobility, Floor, Bed to Chair, Car, Manufacturing systems engineer, Metallurgist: PT Ambulation, Emergency planning/management officer, Stairs     Additional Impairments: OT None  SLP        TR      Anticipated Outcomes Item Anticipated Outcome  Self Feeding S  Swallowing      Basic self-care  S  Toileting  S   Bathroom Transfers S  Bowel/Bladder  Pt will manage bowel and bladder with min assist  Transfers  supervision  Locomotion  supervision short distance gait w/ LRAD  Communication     Cognition     Pain  Pt will manage pain at 3 or less on a scale of 0-10.  Safety/Judgment  Pt will remain free of falls and injury with min assist   Therapy Plan: PT Intensity: Minimum of 1-2 x/day ,45 to 90 minutes PT Frequency: 5 out of 7 days PT Duration Estimated Length of Stay: 10-12 days OT Intensity: Minimum of 1-2 x/day, 45 to 90 minutes OT Frequency: 5 out of 7 days OT Duration/Estimated Length of Stay: 12-16 days      Team Interventions: Nursing Interventions Patient/Family Education, Bladder Management, Disease Management/Prevention, Medication  Management, Pain Management, Discharge Planning  PT interventions Ambulation/gait training, Discharge planning, DME/adaptive equipment instruction, Functional mobility training, Pain management, Psychosocial support, Splinting/orthotics, Therapeutic Activities, UE/LE Strength taining/ROM, Wheelchair propulsion/positioning, UE/LE Coordination activities, Therapeutic Exercise, Stair training, Skin care/wound management, Patient/family education, Disease management/prevention, Neuromuscular re-education, Academic librarian, Training and development officer  OT Interventions Training and development officer, Discharge planning, Pain management, Self Care/advanced ADL retraining, Therapeutic Activities, UE/LE Coordination activities, Visual/perceptual remediation/compensation, Therapeutic Exercise, Skin care/wound managment, Patient/family education, Functional mobility training, Disease mangement/prevention, Academic librarian, Engineer, drilling, Neuromuscular re-education, Psychosocial support, Splinting/orthotics, UE/LE Strength taining/ROM, Wheelchair propulsion/positioning  SLP Interventions    TR Interventions    SW/CM Interventions     Barriers to Discharge MD  Medical stability and Wound care  Nursing      PT Other (comments), Inaccessible home environment posterior hip precautions  OT Inaccessible home environment, Home environment access/layout    SLP      SW       Team Discharge Planning: Destination: PT-Home ,OT- Home , SLP-  Projected Follow-up: PT-Home health PT, OT-  Home health OT, SLP-  Projected Equipment Needs: PT-To be determined, OT- Tub/shower seat, 3 in 1 bedside comode, SLP-  Equipment Details: PT- , OT-  Patient/family involved in discharge planning: PT- Patient,  OT-Patient, SLP-   MD ELOS: 14-16 days. Medical Rehab Prognosis:  Good Assessment: 86 year old female with history of CHF-Coumadin, chronic diastolic HF, HTN, depression, neuropathy,  COPD/bronchiectasis--2L oxygen  dependent nightly; who tripped and fell over her dog night prior to admission on 10/27/2019 with progressive right hip pain.  CT head unremarkable for acute intracranial process.  Imaging revealed right femur fracture.  She was found to have subcapital fracture proximal right femur and underwent right hip hemi by Dr. Lorin Mercy on 10/28/2019. Post op to be WBAT and Coumadin resumed.  Hospital course complicated by hypotension, acute on chronic hyponatremia, as well as increase in oxygen needs (upto 5 L) felt to be due to fluid overload. IVF were d/ced and she received IV Lasix X 1 with improvement in respiratory status. Per nursing reports, she has had issues with sundowning also. Therapy evaluations completed revealing patients with decreased endurance, dizziness with activity, problems with sequencing and poor safety awareness.  We will set goals for supervision with PT/OT.  Due to the current state of emergency, patients may not be receiving their 3-hours of Medicare-mandated therapy.  See Team Conference Notes for weekly updates to the plan of care

## 2019-10-30 NOTE — TOC Progression Note (Signed)
Transition of Care [facility]) - Progression Note    Patient Details  Name: Stephanie Cortez MRN: [id number] Date of Birth: 10/21/1933  Transition of Care [facility]) CM/SW [place], LCSW Phone Number: 10/30/2019, 10:32 AM  Clinical Narrative:    CIR evaluation pending.   TOC staff will continue to follow for SNF need if CIR does not approve.    Expected Discharge Plan: [place] Barriers to Discharge: Continued Medical Work up  Expected Discharge Plan and Services Expected Discharge Plan: [place] In-house Referral: Clinical Social Work Discharge Planning Services: NA Post Acute Care Choice: [place] Living arrangements for the past 2 months: Single Family Home                 DME Arranged: N/A DME Agency: NA         HH Agency: NA         Social Determinants of Health (SDOH) Interventions    Readmission Risk Interventions No flowsheet data found.

## 2019-10-30 NOTE — Progress Notes (Signed)
Jamse Arn, MD  Physician  Physical Medicine and Rehabilitation  PMR Pre-admission     Signed  Date of Service:  10/30/2019 12:08 PM      Related encounter: ED to Hosp-Admission (Discharged) from 10/27/2019 in [place] Admission Coordinator Pre-Admission Assessment   Patient: Stephanie Cortez is an 86 y.o., female MRN: [id number] DOB: 1933/08/31 Height: 5' 6"  (167.6 cm) Weight: 86.7 kg   Insurance Information HMO:     PPO:      PCP:      IPA:      80/20: yes     OTHER:  PRIMARY: Medicare A and B      Policy#: [id number]      Subscriber: patient CM Name:       Phone#:      Fax#:  Pre-Cert#:       Employer:  Benefits:  Phone #: verified online     Name: verified eligibility online via [place] on 10/30/19 Eff. Date: Part A effective 07/02/2000, part B effective 07/02/2009     Deduct: $1,484      Out of Pocket Max: NA      Life Max: NA CIR: covered per Medicare guidelines once yearly deductible has been met.      SNF: days 1-20, 100%, days 21-100, 80% Outpatient: 80%     Co-Pay: 20% Home Health: 100%      Co-Pay:  DME: 80%     Co-Pay: 20% Providers: Pt's choice SECONDARY: UHC      Policy#: [id number]     Phone#: [phone]   Financial Counselor:       Phone#:    The "Data Collection Information Summary" for patients in Inpatient Rehabilitation Facilities with attached "Privacy Act [place] Records" was provided and verbally reviewed with: Patient   Emergency Contact Information         Contact Information     Name Relation Home Work Christmas Daughter [phone]   [phone]    Mayda, [place] [phone]             Current Medical History  Patient Admitting Diagnosis: Right femoral neck fracture, closed. S/p right hip hemiarthroplasty   History of Present Illness: Pt is an 86 yo Female with history of permanent A. Fib on coumadin, chronic diastolic heart failure, HTN, probably  ILD, hypothyroidism, hyperlipidemia, and depression. She was admitted to [facility] on 10/27/19 after patient tripped over her dog, resulting in a subcapital fracture of the right proximal femur. Orthopedics was consulted and recommended an arthoplasty bipolar hip repair for her right femoral neck fracture. Surgery was performed by Dr. Lorin Mercy on 10/28/19. Post op, pt with oxygen desaturation, requiring up to 5L [place]. Throughout her stay, pt required management of her acute on chronic hypoxic respiratory failure, concern for atelectasis and mild volume overload. Pt has been having hyponatremia, which is suspected to be secondary to half-normal saline; IV fluids discontinued and lasix given. Pt has been evaluated by therapies with recommendation by PT for CIR. Pt is to be admitted to CIR on 10/30/19.         Patient's medical record from [facility] has been reviewed by the rehabilitation admission coordinator and physician.   Past Medical History      Past Medical History:  Diagnosis Date  . Anemia      - Hgb 9.7gm% on 07/13/2008 in [place] -  Hgg 129gm% wiht normal irone levsl and ferritin 10/27/2008 in [place] Recurrent otitis/sinusitis  . Anxiety      chronic BZ prn  . Atrial fibrillation (HCC)      chronic anticoag  . Bronchiectasis      >PFT 07/13/2008 in [place] 1.9L/76%, FVC 2.45L/74, Ratio 79, TLC 121%, DLCO 64%  AE BRonchiectasis - Dec 2010.New Rx:  outpatient - Feb 2011 - Rx outpatient  . CHF (congestive heart failure) ([place])    . COPD (chronic obstructive pulmonary disease) (HCC)      bronchiectasis  . Cricopharyngeal achalasia    . Depression    . Diverticulosis    . Dyslipidemia    . Eczema    . GERD with stricture    . Glaucoma    . H. pylori infection    . Hypertension    . Hyponatremia      chronic, s/p endo eval 06/2012  . Hypothyroid    . IBS (irritable bowel syndrome)    . Lumbar disc disease    . Neuropathy of both feet    . Segmental colitis ([place])    . Vertigo    .  Wears glasses        Family History   family history includes Atrial fibrillation in an other family member; Cancer in her father; Colon polyps in her sister; Emphysema in her brother; Hypertension in her mother; Kidney disease in her sister; Other in her father; Pancreatic cancer in her sister; Stroke in her mother.   Prior Rehab/Hospitalizations Has the patient had prior rehab or hospitalizations prior to admission? No   Has the patient had major surgery during 100 days prior to admission? Yes              Current Medications   Current Facility-Administered Medications:  .  acetaminophen (TYLENOL) tablet 325-650 mg, 325-650 mg, Oral, Q6H PRN, Marybelle Killings, MD, 650 mg at 10/30/19 0909 .  bisoprolol (ZEBETA) tablet 10 mg, 10 mg, Oral, Daily, Marybelle Killings, MD, 10 mg at 10/30/19 0909 .  Chlorhexidine Gluconate Cloth 2 % PADS 6 each, 6 each, Topical, Daily, Marybelle Killings, MD .  diltiazem (CARDIZEM CD) 24 hr capsule 240 mg, 240 mg, Oral, QHS, Marybelle Killings, MD, Stopped at 10/29/19 2218 .  docusate sodium (COLACE) capsule 100 mg, 100 mg, Oral, BID, Marybelle Killings, MD, 100 mg at 10/30/19 0909 .  FLUoxetine (PROZAC) capsule 10 mg, 10 mg, Oral, BID PRN, Marybelle Killings, MD .  fluticasone (FLONASE) 50 MCG/ACT nasal spray 1 spray, 1 spray, Each Nare, BID PRN, Marybelle Killings, MD .  furosemide (LASIX) tablet 20 mg, 20 mg, Oral, Daily PRN, Marybelle Killings, MD .  HYDROcodone-acetaminophen (NORCO) 7.5-325 MG per tablet 1-2 tablet, 1-2 tablet, Oral, Q4H PRN, Marybelle Killings, MD .  HYDROcodone-acetaminophen (NORCO/VICODIN) 5-325 MG per tablet 1-2 tablet, 1-2 tablet, Oral, Q4H PRN, Marybelle Killings, MD, 1 tablet at 10/29/19 1810 .  irbesartan (AVAPRO) tablet 300 mg, 300 mg, Oral, Daily, Marybelle Killings, MD, 300 mg at 10/30/19 0909 .  levothyroxine (SYNTHROID) tablet 50 mcg, 50 mcg, Oral, QAC breakfast, Marybelle Killings, MD, 50 mcg at 10/30/19 0516 .  menthol-cetylpyridinium (CEPACOL) lozenge 3 mg, 1 lozenge, Oral, PRN  **OR** phenol (CHLORASEPTIC) mouth spray 1 spray, 1 spray, Mouth/Throat, PRN, Marybelle Killings, MD .  metoCLOPramide (REGLAN) tablet 5-10 mg, 5-10 mg, Oral, Q8H PRN **OR** metoCLOPramide (REGLAN) injection 5-10 mg, 5-10 mg, Intravenous, Q8H  PRN, Marybelle Killings, MD .  mometasone-formoterol [facility]) 100-5 MCG/ACT inhaler 2 puff, 2 puff, Inhalation, BID, Marybelle Killings, MD, 2 puff at 10/30/19 0912 .  morphine 2 MG/ML injection 0.5-1 mg, 0.5-1 mg, Intravenous, Q2H PRN, Marybelle Killings, MD, 1 mg at 10/29/19 0355 .  ondansetron (ZOFRAN) tablet 4 mg, 4 mg, Oral, Q6H PRN **OR** ondansetron (ZOFRAN) injection 4 mg, 4 mg, Intravenous, Q6H PRN, Marybelle Killings, MD .  pravastatin (PRAVACHOL) tablet 40 mg, 40 mg, Oral, QHS, Marybelle Killings, MD, 40 mg at 10/29/19 2121 .  traMADol (ULTRAM) tablet 50 mg, 50 mg, Oral, Q6H, Marybelle Killings, MD, 50 mg at 10/30/19 1147 .  Warfarin - Pharmacist Dosing Inpatient, , Does not apply, q1600, Angela Adam, [facility]   Patients Current Diet:     Diet Order                      Diet Heart Room service appropriate? Yes; Fluid consistency: Thin  Diet effective now                   Precautions / Restrictions Precautions Precautions: Posterior Hip Precaution Booklet Issued: Yes (comment) Restrictions Weight Bearing Restrictions: No RLE Weight Bearing: Weight bearing as tolerated    Has the patient had 2 or more falls or a fall with injury in the past year? Yes   Prior Activity Level Community (5-7x/wk): active PTA, independent with ambulation and ran her own errands   Prior Functional Level Self Care: Did the patient need help bathing, dressing, using the toilet or eating? Independent   Indoor Mobility: Did the patient need assistance with walking from room to room (with or without device)? Independent   Stairs: Did the patient need assistance with internal or external stairs (with or without device)? Independent   Functional Cognition: Did the patient need help planning  regular tasks such as shopping or remembering to take medications? Independent   Home Assistive Devices / Equipment Home Assistive Devices/Equipment: Eyeglasses Home Equipment: Grab bars - tub/shower, Shower seat - built in   Prior Device Use: Indicate devices/aids used by the patient prior to current illness, exacerbation or injury? None of the above   Current Functional Level Cognition   Overall Cognitive Status: Within Functional Limits for tasks assessed Orientation Level: Oriented to person, Oriented to place, Oriented to situation, Disoriented to time General Comments: some difficulty with sequencing however suspect due to post op day 1/medications    Extremity Assessment (includes Sensation/Coordination)   Upper Extremity Assessment: Generalized weakness  Lower Extremity Assessment: Generalized weakness, RLE deficits/detail     ADLs   Overall ADL's : Needs assistance/impaired Grooming: Set up, Sitting Upper Body Bathing: Sitting, Set up Lower Body Bathing: Maximal assistance, Sitting/lateral leans, Sit to/from stand Upper Body Dressing : Set up, Sitting Lower Body Dressing: Maximal assistance, Total assistance, Sitting/lateral leans, Sit to/from stand Lower Body Dressing Details (indicate cue type and reason): total A don socks due to precautions, initiated education regarding LB AE  Toilet Transfer: Minimal assistance, Moderate assistance, +2 for physical assistance, +2 for safety/equipment, BSC, RW, Ambulation, Cueing for safety, Cueing for sequencing, Adhering to hip precautions Toilet Transfer Details (indicate cue type and reason): simulated with functional mobility and reclincer transfer, requires step by step cues for sequencing movement of walker, LEs to take steps Toileting- Clothing Manipulation and Hygiene: Maximal assistance, Total assistance, Sitting/lateral lean, Sit to/from stand Functional mobility during ADLs: Moderate assistance, Minimal assistance, Cueing for  safety, Cueing for sequencing, +2 for safety/equipment, +2 for physical assistance, Rolling walker General ADL Comments: Patient requires increased assistance with self care due to decreased strength, balance, safety awareness, knowledge of precautions     Mobility   Overal bed mobility: Needs Assistance Bed Mobility: Sit to Supine Supine to sit: Mod assist, Max assist, HOB elevated Sit to supine: Mod assist, Max assist, +2 for physical assistance, +2 for safety/equipment General bed mobility comments: Increased time with cues for sequence and physical assist to manage R LE and control trunk     Transfers   Overall transfer level: Needs assistance Equipment used: Rolling walker (2 wheeled) Transfers: Sit to/from Stand Sit to Stand: Mod assist, +2 physical assistance, +2 safety/equipment General transfer comment: max cues for sequencing, proper hand placement and body mechanics     Ambulation / Gait / Stairs / Wheelchair Mobility   Ambulation/Gait Ambulation/Gait assistance: Min assist, Mod assist, +2 physical assistance, +2 safety/equipment Gait Distance (Feet): 9 Feet Assistive device: Rolling walker (2 wheeled) Gait Pattern/deviations: Step-to pattern, Decreased step length - right, Decreased step length - left, Shuffle, Trunk flexed General Gait Details: Step-be-step cues for sequence, foot placement and UE WB.   Gait velocity: decr     Posture / Balance Balance Overall balance assessment: Needs assistance Sitting-balance support: No upper extremity supported, Feet supported Sitting balance-Leahy Scale: Good Standing balance support: Bilateral upper extremity supported, During functional activity Standing balance-Leahy Scale: Poor Standing balance comment: reliant on external support      Special needs/care consideration Oxygen 2L [place] , Skin: ecchymosis to right, left arm , skin tear to wrist (right and left) and right hip closed incision.   Designated visitor [name] and Mitzi Hansen  (daugther and son-in-law)    Previous Environmental health practitioner (from acute therapy documentation) Living Arrangements: Alone Available Help at Discharge: Family Type of Home: House Home Layout: One level Home Access: Stairs to enter Entrance Stairs-Rails: None Entrance Stairs-Number of Steps: 2 front, 3 in back Bathroom Shower/Tub: Multimedia programmer: Handicapped height [place]: No   Discharge Living Setting Plans for Discharge Living Setting: Patient's home(will have family stay with her at DC) Type of Home at Discharge: House Discharge Home Layout: One level Discharge Home Access: Stairs to enter Entrance Stairs-Rails: None Entrance Stairs-Number of Steps: 3 steps vs 2 steps entrance Discharge Bathroom Shower/Tub: Walk-in shower Discharge Bathroom Toilet: Handicapped height Discharge Bathroom Accessibility: Yes How Accessible: Accessible via walker Does the patient have any problems obtaining your medications?: No   Social/Family/Support Systems Patient Roles: Spouse, Other (Comment)(has a spouse but he does not live with her; active and Indep) Contact Information: daugther: [phone] Anticipated Caregiver: daugther and her husband (andrew) Anticipated Caregiver's Contact Information: see above Ability/Limitations of Caregiver: supervision Caregiver Availability: 24/7 Discharge Plan Discussed with Primary Caregiver: Yes(pt and her daugther ) Is Caregiver In Agreement with Plan?: Yes Does Caregiver/Family have Issues with Lodging/Transportation while Pt is in Rehab?: No   Goals Patient/Family Goal for Rehab: PT/OT: Mod I/Supervision; SLP: NA Expected length of stay: 8-12 days Cultural Considerations: NA Pt/Family Agrees to Admission and willing to participate: Yes Program Orientation Provided & Reviewed with Pt/Caregiver Including Roles  & Responsibilities: Yes  Barriers to Discharge: Home environment access/layout, New oxygen  Barriers to Discharge  Comments: steps to enter home; on new O2 during the day   Decrease burden of Care through IP rehab admission: NA   Possible need for SNF placement upon discharge: Not anticipated. Pt has good social support  at DC from family and anticipate pt can progress to at least a Mod I/Supervision level in a reasonable amount of time to allow her to return home safely with support in place.    Patient Condition: I have reviewed medical records from [facility], spoken with MD, SW, and patient and daughter. I discussed via phone for inpatient rehabilitation assessment.  Patient will benefit from ongoing PT and OT, can actively participate in 3 hours of therapy a day 5 days of the week, and can make measurable gains during the admission.  Patient will also benefit from the coordinated team approach during an Inpatient Acute Rehabilitation admission.  The patient will receive intensive therapy as well as Rehabilitation physician, nursing, social worker, and care management interventions.  Due to safety, skin/wound care, disease management, medication administration, pain management and patient education the patient requires 24 hour a day rehabilitation nursing.  The patient is currently Min/Mod A +2 with mobility and Min A to Total A for basic ADLs.  Discharge setting and therapy post discharge at home with home health is anticipated.  Patient has agreed to participate in the Acute Inpatient Rehabilitation Program and will admit today.   Preadmission Screen Completed By:  Raechel Ache, 10/30/2019 12:08 PM ______________________________________________________________________   Discussed status with Dr. Posey Pronto on 10/30/19 at 12:23PM and received approval for admission today.   Admission Coordinator:  Raechel Ache, OT, time 12:23PM/Date 10/30/19    Assessment/Plan: Diagnosis: Right femoral neck fracture, closed. S/p right hip hemiarthroplasty   1. Does the need for close, 24 hr/day Medical supervision  in concert with the patient's rehab needs make it unreasonable for this patient to be served in a less intensive setting? Yes  2. Co-Morbidities requiring supervision/potential complications: permanent A. Fib on coumadin, chronic diastolic heart failure, HTN, probably ILD, hypothyroidism, hyperlipidemia, depression 3. Due to bladder management, bowel management, safety, skin/wound care, disease management, pain management and patient education, does the patient require 24 hr/day rehab nursing? Yes 4. Does the patient require coordinated care of a physician, rehab nurse, PT, OT to address physical and functional deficits in the context of the above medical diagnosis(es)? Yes Addressing deficits in the following areas: balance, endurance, locomotion, strength, transferring, bathing, dressing, toileting and psychosocial support 5. Can the patient actively participate in an intensive therapy program of at least 3 hrs of therapy 5 days a week? Yes 6. The potential for patient to make measurable gains while on inpatient rehab is excellent 7. Anticipated functional outcomes upon discharge from inpatient rehab: supervision and min assist PT, supervision and min assist OT, n/a SLP 8. Estimated rehab length of stay to reach the above functional goals is: 10-14 days. 9. Anticipated discharge destination: Home 10. Overall Rehab/Functional Prognosis: good     MD Signature: Delice Lesch, MD, ABPMR        Revision History

## 2019-10-30 NOTE — Progress Notes (Signed)
ANTICOAGULATION CONSULT NOTE - Follow Up Consult  Pharmacy Consult for warfarin Indication: atrial fibrillation, s/p R THA  Allergies  Allergen Reactions  . Doxycycline     Rash on face and neck  . Flagyl [Metronidazole] Nausea And Vomiting  . Moxifloxacin Other (See Comments)     Weakness/fatigue  . Pantoprazole Sodium Rash  . Penicillins Rash    Has patient had a PCN reaction causing immediate rash, facial/tongue/throat swelling, SOB or lightheadedness with hypotension:unsure Has patient had a PCN reaction causing severe rash involving mucus membranes or skin necrosis:unsure Has patient had a PCN reaction that required hospitalization:No Has patient had a PCN reaction occurring within the last 10 years:Yes Cannot recall exact reaction due to time lapse If all of the above answers are "NO", then may proceed with Cephalosporin use.     Patient Measurements: Height: 5\' 6"  (167.6 cm) Weight: 89.7 kg (197 lb 12 oz) IBW/kg (Calculated) : 59.3  Vital Signs: Temp: 97.7 F (36.5 C) (04/30 1604) Temp Source: Oral (04/30 1604) BP: 136/71 (04/30 1604) Pulse Rate: 72 (04/30 1604)  Labs: Recent Labs    10/28/19 0250 10/28/19 0250 10/29/19 0309 10/30/19 0313  HGB 11.7*   < > 10.7* 10.7*  HCT 37.4  --  35.7* 34.7*  PLT 206  --  171 178  LABPROT 18.6*  --  18.9* 24.9*  INR 1.6*  --  1.6* 2.3*  CREATININE 0.66  --  0.69 1.00   < > = values in this interval not displayed.    Estimated Creatinine Clearance: 47.3 mL/min (by C-G formula based on SCr of 1 mg/dL).   Medical History: Past Medical History:  Diagnosis Date  . Anemia    - Hgb 9.7gm% on 07/13/2008 in [place] -  Hgg 129gm% wiht normal irone levsl and ferritin 10/27/2008 in [place] Recurrent otitis/sinusitis  . Anxiety    chronic BZ prn  . Atrial fibrillation (HCC)    chronic anticoag  . Bronchiectasis    >PFT 07/13/2008 in [place] 1.9L/76%, FVC 2.45L/74, Ratio 79, TLC 121%, DLCO 64%  AE BRonchiectasis - Dec 2010.New  Rx:  outpatient - Feb 2011 - Rx outpatient  . CHF (congestive heart failure) ([place])   . COPD (chronic obstructive pulmonary disease) (HCC)    bronchiectasis  . Cricopharyngeal achalasia   . Depression   . Diverticulosis   . Dyslipidemia   . Eczema   . GERD with stricture   . Glaucoma   . H. pylori infection   . Hypertension   . Hyponatremia    chronic, s/p endo eval 06/2012  . Hypothyroid   . IBS (irritable bowel syndrome)   . Lumbar disc disease   . Neuropathy of both feet   . Segmental colitis ([place])   . Vertigo   . Wears glasses     Assessment: 86 yr old female with PMH significant for HF, HTN, probable ILD. Pt on warfarin PTA for hx of atrial fibrillation. Pt admitted s/p fall, underwent right hip arthroplasty on 4/28. Pharmacy consulted to resume warfarin dosing on 4/28 post-op.   Home dose: Warfarin 1.25 mg on M,W, F and 2.5 mg all other days   (LD 4/26 PTA @ 2200)  10/30/2019  CBC: Hgb (10.7) slightly low, but stable post-op. Plt WNL & stable  INR 2.3 is therapeutic today, increased from 1.6 yesterday  No documented bleeding issues  No significant DDI (on levothyroxine)  100% of meal charted as eaten yesterday. Albumin 3.4 on 4/27  Goal of  Therapy:  INR 2-3   Plan:   Warfarin 0.5 mg PO once today (give reduced dose today given INR increase today)  INR daily, follow CBC. Monitor for signs of bleeding  Gillermina Hu, PharmD, BCPS, [facility] Clinical Pharmacist 10/30/19 4:21 PM

## 2019-10-30 NOTE — Care Management Important Message (Signed)
Important Message  Patient Details IM Letter given to [place] Case Manager to present to the Patient Name: Stephanie Cortez MRN: [id number] Date of Birth: [birth year]   Medicare Important Message Given:  Yes     Kerin Salen 10/30/2019, 2:14 PM

## 2019-10-30 NOTE — Telephone Encounter (Signed)
Called daughter [name] 2:35 PM 10/30/2019 -> She had hip fracture after fall due to dog. They are still at [facility]. Currently on o2 2L [place]  Plan  - do inpatient [facility] - might justg be at rehab 7 days or so  - pls give followup 30 min first available  - however, if o2 needs persistent and interfering with rehab -> then pulm consult needed for eval of bronch     SIGNATURE    Dr. Brand Males, M.D., F.C.C.P,  Pulmonary and Critical Care Medicine Staff Physician, [place] Director - Interstitial Lung Disease  Program  Pulmonary [place] at [place], [place], [address]  Pager: [phone], If no answer or between  15:00h - 7:00h: call [phone] Telephone: [phone]  2:39 PM 10/30/2019

## 2019-10-30 NOTE — Progress Notes (Signed)
Inpatient Rehabilitation-Admissions Coordinator   Spoke with Stephanie Cortez and her daughter via phone for CIR assessment. Feel Stephanie Cortez is a good candidate for CIR at this time. We reviewed expectations, anticipated LOS, and anticipated assistance at DC. Stephanie Cortez and her daugther would like to proceed. Stephanie Cortez's family has confirmed DC support.   Received medical clearance from Dr. Neysa Bonito for admit to CIR today. Stephanie Cortez and her daughter notified of bed offer and have accepted. RN and [facility] team aware of plan for today.   Awaiting DC order to set up transport through [facility]. Dr. Neysa Bonito to notify me once the order has been placed.   Please call if questions.   Raechel Ache, OTR/L  Rehab Admissions Coordinator  [phone] 10/30/2019 1:04 PM

## 2019-10-30 NOTE — PMR Pre-admission (Signed)
PMR Admission Coordinator Pre-Admission Assessment  Patient: Stephanie Cortez is an 86 y.o., female MRN: [id number] DOB: December 18, 1933 Height: _0  (167.6 cm) Weight: 86.7 kg  Insurance Information HMO:     PPO:      PCP:      IPA:      80/20: yes     OTHER:  PRIMARY: Medicare A and B      Policy#: [id number]      Subscriber: patient CM Name:       Phone#:      Fax#:  Pre-Cert#:       Employer:  Benefits:  Phone #: verified online     Name: verified eligibility online via [place] on 10/30/19 Eff. Date: Part A effective 07/02/2000, part B effective 07/02/2009     Deduct: $1,484      Out of Pocket Max: NA      Life Max: NA CIR: covered per Medicare guidelines once yearly deductible has been met.      SNF: days 1-20, 100%, days 21-100, 80% Outpatient: 80%     Co-Pay: 20% Home Health: 100%      Co-Pay:  DME: 80%     Co-Pay: 20% Providers: Pt's choice SECONDARY: [employer]      Policy#: [id number]     Phone#: [phone]  Financial Counselor:       Phone#:   The "Data Collection Information Summary" for patients in Inpatient Rehabilitation Facilities with attached "Privacy Act [place] Records" was provided and verbally reviewed with: Patient  Emergency Contact Information Contact Information    Name Relation Home Work Stephanie Cortez [phone]  [phone]   Stephanie, Cortez [phone]        Current Medical History  Patient Admitting Diagnosis: Right femoral neck fracture, closed. S/p right hip hemiarthroplasty  History of Present Illness: Pt is an 86 yo Female with history of permanent A. Fib on coumadin, chronic diastolic heart failure, HTN, probably ILD, hypothyroidism, hyperlipidemia, and depression. She was admitted to [facility] on 10/27/19 after patient tripped over her dog, resulting in a subcapital fracture of the right proximal femur. Orthopedics was consulted and recommended an arthoplasty bipolar hip repair for her right femoral neck fracture.  Surgery was performed by Dr. Lorin Mercy on 10/28/19. Post op, pt with oxygen desaturation, requiring up to 5L [place]. Throughout her stay, pt required management of her acute on chronic hypoxic respiratory failure, concern for atelectasis and mild volume overload. Pt has been having hyponatremia, which is suspected to be secondary to half-normal saline; IV fluids discontinued and lasix given. Pt has been evaluated by therapies with recommendation by PT for CIR. Pt is to be admitted to CIR on 10/30/19.       Patient's medical record from [facility] has been reviewed by the rehabilitation admission coordinator and physician.  Past Medical History  Past Medical History:  Diagnosis Date  . Anemia    - Hgb 9.7gm% on 07/13/2008 in [place] -  Hgg 129gm% wiht normal irone levsl and ferritin 10/27/2008 in [place] Recurrent otitis/sinusitis  . Anxiety    chronic BZ prn  . Atrial fibrillation (HCC)    chronic anticoag  . Bronchiectasis    >PFT 07/13/2008 in [place] 1.9L/76%, FVC 2.45L/74, Ratio 79, TLC 121%, DLCO 64%  AE BRonchiectasis - Dec 2010.New Rx:  outpatient - Feb 2011 - Rx outpatient  . CHF (congestive heart failure) ([place])   . COPD (chronic obstructive pulmonary disease) (HCC)    bronchiectasis  .  Cricopharyngeal achalasia   . Depression   . Diverticulosis   . Dyslipidemia   . Eczema   . GERD with stricture   . Glaucoma   . H. pylori infection   . Hypertension   . Hyponatremia    chronic, s/p endo eval 06/2012  . Hypothyroid   . IBS (irritable bowel syndrome)   . Lumbar disc disease   . Neuropathy of both feet   . Segmental colitis ([place])   . Vertigo   . Wears glasses     Family History   family history includes Atrial fibrillation in an other family member; Cancer in her father; Colon polyps in her sister; Emphysema in her brother; Hypertension in her mother; Kidney disease in her sister; Other in her father; Pancreatic cancer in her sister; Stroke in her mother.  Prior  Rehab/Hospitalizations Has the patient had prior rehab or hospitalizations prior to admission? No  Has the patient had major surgery during 100 days prior to admission? Yes   Current Medications  Current Facility-Administered Medications:  .  acetaminophen (TYLENOL) tablet 325-650 mg, 325-650 mg, Oral, Q6H PRN, Marybelle Killings, MD, 650 mg at 10/30/19 0909 .  bisoprolol (ZEBETA) tablet 10 mg, 10 mg, Oral, Daily, Marybelle Killings, MD, 10 mg at 10/30/19 0909 .  Chlorhexidine Gluconate Cloth 2 % PADS 6 each, 6 each, Topical, Daily, Marybelle Killings, MD .  diltiazem (CARDIZEM CD) 24 hr capsule 240 mg, 240 mg, Oral, QHS, Marybelle Killings, MD, Stopped at 10/29/19 2218 .  docusate sodium (COLACE) capsule 100 mg, 100 mg, Oral, BID, Marybelle Killings, MD, 100 mg at 10/30/19 0909 .  FLUoxetine (PROZAC) capsule 10 mg, 10 mg, Oral, BID PRN, Marybelle Killings, MD .  fluticasone (FLONASE) 50 MCG/ACT nasal spray 1 spray, 1 spray, Each Nare, BID PRN, Marybelle Killings, MD .  furosemide (LASIX) tablet 20 mg, 20 mg, Oral, Daily PRN, Marybelle Killings, MD .  HYDROcodone-acetaminophen (NORCO) 7.5-325 MG per tablet 1-2 tablet, 1-2 tablet, Oral, Q4H PRN, Marybelle Killings, MD .  HYDROcodone-acetaminophen (NORCO/VICODIN) 5-325 MG per tablet 1-2 tablet, 1-2 tablet, Oral, Q4H PRN, Marybelle Killings, MD, 1 tablet at 10/29/19 1810 .  irbesartan (AVAPRO) tablet 300 mg, 300 mg, Oral, Daily, Marybelle Killings, MD, 300 mg at 10/30/19 0909 .  levothyroxine (SYNTHROID) tablet 50 mcg, 50 mcg, Oral, QAC breakfast, Marybelle Killings, MD, 50 mcg at 10/30/19 0516 .  menthol-cetylpyridinium (CEPACOL) lozenge 3 mg, 1 lozenge, Oral, PRN **OR** phenol (CHLORASEPTIC) mouth spray 1 spray, 1 spray, Mouth/Throat, PRN, Marybelle Killings, MD .  metoCLOPramide (REGLAN) tablet 5-10 mg, 5-10 mg, Oral, Q8H PRN **OR** metoCLOPramide (REGLAN) injection 5-10 mg, 5-10 mg, Intravenous, Q8H PRN, Marybelle Killings, MD .  mometasone-formoterol (DULERA) 100-5 MCG/ACT inhaler 2 puff, 2 puff, Inhalation,  BID, Marybelle Killings, MD, 2 puff at 10/30/19 0912 .  morphine 2 MG/ML injection 0.5-1 mg, 0.5-1 mg, Intravenous, Q2H PRN, Marybelle Killings, MD, 1 mg at 10/29/19 0355 .  ondansetron (ZOFRAN) tablet 4 mg, 4 mg, Oral, Q6H PRN **OR** ondansetron (ZOFRAN) injection 4 mg, 4 mg, Intravenous, Q6H PRN, Marybelle Killings, MD .  pravastatin (PRAVACHOL) tablet 40 mg, 40 mg, Oral, QHS, Marybelle Killings, MD, 40 mg at 10/29/19 2121 .  traMADol (ULTRAM) tablet 50 mg, 50 mg, Oral, Q6H, Marybelle Killings, MD, 50 mg at 10/30/19 1147 .  Warfarin - Pharmacist Dosing Inpatient, , Does not apply, q1600, Angela Adam, [facility]  Patients Current Diet:  Diet Order            Diet Heart Room service appropriate? Yes; Fluid consistency: Thin  Diet effective now              Precautions / Restrictions Precautions Precautions: Posterior Hip Precaution Booklet Issued: Yes (comment) Restrictions Weight Bearing Restrictions: No RLE Weight Bearing: Weight bearing as tolerated   Has the patient had 2 or more falls or a fall with injury in the past year? Yes  Prior Activity Level Community (5-7x/wk): active PTA, independent with ambulation and ran her own errands  Prior Functional Level Self Care: Did the patient need help bathing, dressing, using the toilet or eating? Independent  Indoor Mobility: Did the patient need assistance with walking from room to room (with or without device)? Independent  Stairs: Did the patient need assistance with internal or external stairs (with or without device)? Independent  Functional Cognition: Did the patient need help planning regular tasks such as shopping or remembering to take medications? Independent  Home Assistive Devices / Equipment Home Assistive Devices/Equipment: Eyeglasses Home Equipment: Grab bars - tub/shower, Shower seat - built in  Prior Device Use: Indicate devices/aids used by the patient prior to current illness, exacerbation or injury? None of the above  Current  Functional Level Cognition  Overall Cognitive Status: Within Functional Limits for tasks assessed Orientation Level: Oriented to person, Oriented to place, Oriented to situation, Disoriented to time General Comments: some difficulty with sequencing however suspect due to post op day 1/medications    Extremity Assessment (includes Sensation/Coordination)  Upper Extremity Assessment: Generalized weakness  Lower Extremity Assessment: Generalized weakness, RLE deficits/detail    ADLs  Overall ADL's : Needs assistance/impaired Grooming: Set up, Sitting Upper Body Bathing: Sitting, Set up Lower Body Bathing: Maximal assistance, Sitting/lateral leans, Sit to/from stand Upper Body Dressing : Set up, Sitting Lower Body Dressing: Maximal assistance, Total assistance, Sitting/lateral leans, Sit to/from stand Lower Body Dressing Details (indicate cue type and reason): total A don socks due to precautions, initiated education regarding LB AE  Toilet Transfer: Minimal assistance, Moderate assistance, +2 for physical assistance, +2 for safety/equipment, BSC, RW, Ambulation, Cueing for safety, Cueing for sequencing, Adhering to hip precautions Toilet Transfer Details (indicate cue type and reason): simulated with functional mobility and reclincer transfer, requires step by step cues for sequencing movement of walker, LEs to take steps Toileting- Clothing Manipulation and Hygiene: Maximal assistance, Total assistance, Sitting/lateral lean, Sit to/from stand Functional mobility during ADLs: Moderate assistance, Minimal assistance, Cueing for safety, Cueing for sequencing, +2 for safety/equipment, +2 for physical assistance, Rolling walker General ADL Comments: Patient requires increased assistance with self care due to decreased strength, balance, safety awareness, knowledge of precautions    Mobility  Overal bed mobility: Needs Assistance Bed Mobility: Sit to Supine Supine to sit: Mod assist, Max assist,  HOB elevated Sit to supine: Mod assist, Max assist, +2 for physical assistance, +2 for safety/equipment General bed mobility comments: Increased time with cues for sequence and physical assist to manage R LE and control trunk    Transfers  Overall transfer level: Needs assistance Equipment used: Rolling walker (2 wheeled) Transfers: Sit to/from Stand Sit to Stand: Mod assist, +2 physical assistance, +2 safety/equipment General transfer comment: max cues for sequencing, proper hand placement and body mechanics    Ambulation / Gait / Stairs / Wheelchair Mobility  Ambulation/Gait Ambulation/Gait assistance: Min assist, Mod assist, +2 physical assistance, +2 safety/equipment Gait Distance (Feet): 9 Feet Assistive  device: Rolling walker (2 wheeled) Gait Pattern/deviations: Step-to pattern, Decreased step length - right, Decreased step length - left, Shuffle, Trunk flexed General Gait Details: Step-be-step cues for sequence, foot placement and UE WB.   Gait velocity: decr    Posture / Balance Balance Overall balance assessment: Needs assistance Sitting-balance support: No upper extremity supported, Feet supported Sitting balance-Leahy Scale: Good Standing balance support: Bilateral upper extremity supported, During functional activity Standing balance-Leahy Scale: Poor Standing balance comment: reliant on external support     Special needs/care consideration Oxygen 2L [place] , Skin: ecchymosis to right, left arm , skin tear to wrist (right and left) and right hip closed incision.   Designated visitor [name] and Mitzi Hansen (daugther and son-in-law)   Previous Environmental health practitioner (from acute therapy documentation) Living Arrangements: Alone Available Help at Discharge: Family Type of Home: House Home Layout: One level Home Access: Stairs to enter Entrance Stairs-Rails: None Entrance Stairs-Number of Steps: 2 front, 3 in back Bathroom Shower/Tub: Multimedia programmer: Handicapped  height [place]: No  Discharge Living Setting Plans for Discharge Living Setting: Patient's home(will have family stay with her at DC) Type of Home at Discharge: House Discharge Home Layout: One level Discharge Home Access: Stairs to enter Entrance Stairs-Rails: None Entrance Stairs-Number of Steps: 3 steps vs 2 steps entrance Discharge Bathroom Shower/Tub: Walk-in shower Discharge Bathroom Toilet: Handicapped height Discharge Bathroom Accessibility: Yes How Accessible: Accessible via walker Does the patient have any problems obtaining your medications?: No  Social/Family/Support Systems Patient Roles: Spouse, Other (Comment)(has a spouse but he does not live with her; active and Indep) Contact Information: daugther: [phone] Anticipated Caregiver: daugther and her husband (andrew) Anticipated Caregiver's Contact Information: see above Ability/Limitations of Caregiver: supervision Caregiver Availability: 24/7 Discharge Plan Discussed with Primary Caregiver: Yes(pt and her daugther ) Is Caregiver In Agreement with Plan?: Yes Does Caregiver/Family have Issues with Lodging/Transportation while Pt is in Rehab?: No  Goals Patient/Family Goal for Rehab: PT/OT: Mod I/Supervision; SLP: NA Expected length of stay: 8-12 days Cultural Considerations: NA Pt/Family Agrees to Admission and willing to participate: Yes Program Orientation Provided & Reviewed with Pt/Caregiver Including Roles  & Responsibilities: Yes  Barriers to Discharge: Home environment access/layout, New oxygen  Barriers to Discharge Comments: steps to enter home; on new O2 during the day  Decrease burden of Care through IP rehab admission: NA  Possible need for SNF placement upon discharge: Not anticipated. Pt has good social support at DC from family and anticipate pt can progress to at least a Mod I/Supervision level in a reasonable amount of time to allow her to return home safely with support in place.    Patient Condition: I have reviewed medical records from [facility], spoken with MD, SW, and patient and Cortez. I discussed via phone for inpatient rehabilitation assessment.  Patient will benefit from ongoing PT and OT, can actively participate in 3 hours of therapy a day 5 days of the week, and can make measurable gains during the admission.  Patient will also benefit from the coordinated team approach during an Inpatient Acute Rehabilitation admission.  The patient will receive intensive therapy as well as Rehabilitation physician, nursing, social worker, and care management interventions.  Due to safety, skin/wound care, disease management, medication administration, pain management and patient education the patient requires 24 hour a day rehabilitation nursing.  The patient is currently Min/Mod A +2 with mobility and Min A to Total A for basic ADLs.  Discharge setting and therapy  post discharge at home with home health is anticipated.  Patient has agreed to participate in the Acute Inpatient Rehabilitation Program and will admit today.  Preadmission Screen Completed By:  Raechel Ache, 10/30/2019 12:08 PM ______________________________________________________________________   Discussed status with Dr. Posey Pronto on 10/30/19 at 12:23PM and received approval for admission today.  Admission Coordinator:  Raechel Ache, OT, time 12:23PM/Date 10/30/19   Assessment/Plan: Diagnosis: Right femoral neck fracture, closed. S/p right hip hemiarthroplasty  1. Does the need for close, 24 hr/day Medical supervision in concert with the patient's rehab needs make it unreasonable for this patient to be served in a less intensive setting? Yes  2. Co-Morbidities requiring supervision/potential complications: permanent A. Fib on coumadin, chronic diastolic heart failure, HTN, probably ILD, hypothyroidism, hyperlipidemia, depression 3. Due to bladder management, bowel management, safety, skin/wound care,  disease management, pain management and patient education, does the patient require 24 hr/day rehab nursing? Yes 4. Does the patient require coordinated care of a physician, rehab nurse, PT, OT to address physical and functional deficits in the context of the above medical diagnosis(es)? Yes Addressing deficits in the following areas: balance, endurance, locomotion, strength, transferring, bathing, dressing, toileting and psychosocial support 5. Can the patient actively participate in an intensive therapy program of at least 3 hrs of therapy 5 days a week? Yes 6. The potential for patient to make measurable gains while on inpatient rehab is excellent 7. Anticipated functional outcomes upon discharge from inpatient rehab: supervision and min assist PT, supervision and min assist OT, n/a SLP 8. Estimated rehab length of stay to reach the above functional goals is: 10-14 days. 9. Anticipated discharge destination: Home 10. Overall Rehab/Functional Prognosis: good   MD Signature: Delice Lesch, MD, ABPMR

## 2019-10-30 NOTE — Progress Notes (Signed)
Pt arrived to 4W26 via carelink. Pt is alert and oriented and vitals are WNL for patient. Pt on 2L of 02 [place]. Pt has no complaints of pain. Pt oriented to unit and safety plan and all questions were addressed. Pt resting with bed alarm in place. Continue plan of care.

## 2019-10-30 NOTE — Progress Notes (Signed)
Physical Therapy Treatment Patient Details Name: Stephanie Cortez MRN: [id number] DOB: 1933/11/06 Today's Date: 10/30/2019    History of Present Illness Patient is a 86 year old female admitted after a fall with closed femoral neck fracture. Pt is s/p R hemiarthoplasty.      PT Comments    Pt continues to progress with mobility including up to bathroom for toileting and performing hand hygiene at sink but with noted reports of increased pain..  Pt eager for dc to CIR this pm.   Follow Up Recommendations  CIR     Equipment Recommendations  None recommended by PT    Recommendations for Other Services       Precautions / Restrictions Precautions Precautions: Posterior Hip Precaution Comments: Pt able to recall 1/3 THP without cues.  All THP reviewed Restrictions Weight Bearing Restrictions: No RLE Weight Bearing: Weight bearing as tolerated    Mobility  Bed Mobility Overal bed mobility: Needs Assistance Bed Mobility: Sit to Supine       Sit to supine: Mod assist   General bed mobility comments: Increased time with cues for sequence and physical assist to manage R LE and control trunk  Transfers Overall transfer level: Needs assistance Equipment used: Rolling walker (2 wheeled) Transfers: Sit to/from Stand Sit to Stand: +2 safety/equipment;From elevated surface;Min assist;Mod assist         General transfer comment: max cues for sequencing, proper hand placement and body mechanics  Ambulation/Gait Ambulation/Gait assistance: Min assist;Mod assist;+2 safety/equipment Gait Distance (Feet): 21 Feet(twice) Assistive device: Rolling walker (2 wheeled) Gait Pattern/deviations: Step-to pattern;Decreased step length - right;Decreased step length - left;Shuffle;Trunk flexed Gait velocity: decr   General Gait Details: cues for sequence, foot placement and UE WB.     Stairs             Wheelchair Mobility    Modified Rankin (Stroke Patients Only)        Balance Overall balance assessment: Needs assistance Sitting-balance support: No upper extremity supported;Feet supported Sitting balance-Leahy Scale: Good     Standing balance support: Bilateral upper extremity supported;During functional activity Standing balance-Leahy Scale: Poor Standing balance comment: reliant on external support                             Cognition Arousal/Alertness: Awake/alert Behavior During Therapy: WFL for tasks assessed/performed Overall Cognitive Status: Within Functional Limits for tasks assessed                                 General Comments: some difficulty with sequencing      Exercises      General Comments        Pertinent Vitals/Pain Pain Assessment: 0-10 Pain Score: 8  Pain Location: R hip/groin Pain Descriptors / Indicators: Grimacing;Discomfort;Operative site guarding;Spasm Pain Intervention(s): Limited activity within patient's tolerance;Monitored during session;Premedicated before session;Patient requesting pain meds-RN notified;Ice applied    Home Living                      Prior Function            PT Goals (current goals can now be found in the care plan section) Acute Rehab PT Goals Patient Stated Goal: Regain IND PT Goal Formulation: With patient Time For Goal Achievement: 11/12/19 Potential to Achieve Goals: Fair Progress towards PT goals: Progressing toward goals    Frequency  Min 5X/week      PT Plan Current plan remains appropriate    Co-evaluation              AM-PAC PT "6 Clicks" Mobility   Outcome Measure  Help needed turning from your back to your side while in a flat bed without using bedrails?: A Lot Help needed moving from lying on your back to sitting on the side of a flat bed without using bedrails?: A Lot Help needed moving to and from a bed to a chair (including a wheelchair)?: A Lot Help needed standing up from a chair using your arms (e.g.,  wheelchair or bedside chair)?: A Lot Help needed to walk in hospital room?: A Lot Help needed climbing 3-5 steps with a railing? : Total 6 Click Score: 11    End of Session Equipment Utilized During Treatment: Gait belt Activity Tolerance: Patient tolerated treatment well Patient left: with call bell/phone within reach;with family/visitor present;in bed;with bed alarm set Nurse Communication: Mobility status PT Visit Diagnosis: Difficulty in walking, not elsewhere classified (R26.2)     Time: [id number] PT Time Calculation (min) (ACUTE ONLY): 31 min  Charges:  $Gait Training: 8-22 mins $Therapeutic Activity: 8-22 mins                     [place] Pager [phone] Office [phone]    Rossana Molchan 10/30/2019, 5:47 PM

## 2019-10-30 NOTE — Discharge Summary (Signed)
Physician Discharge Summary  Stephanie Cortez [id number] DOB: July 26, 1933   PCP: [name], MD  Admit date: 10/27/2019 Discharge date: 10/30/2019 Length of Stay: 3 days   Code Status: Full Code  Admitted From:  Home Discharged to:  CIR Discharge Condition:  Stable  Recommendations for Outpatient Follow-up   1. Follow up with ortho outpatient 2. Increase PO sodium intake for one day and follow up in AM, try to avoid volume overload 3. Continue to work with [facility]   This is an 86 year old female with history of permanent atrial fibrillation on Coumadin, chronic diastolic heart failure, hypertension, suspected ILD, hypothyroidism, hyperlipidemia, depression with recent fall after tripping over her dog landing on right hip.  In the ED, patient had CT of the head which was negative for acute abnormalities, x-ray of pelvis showed acute mildly displaced subcapital fracture of the proximal right femur.  Orthopedic surgery was consulted.  4/28: Right hip hemiarthroplasty for femoral neck fracture with Dr. Lorin Mercy -Overnight patient requiring increased amounts of supplemental O2, thought to be secondary to large volumes of 1/2 NS which also led to acute on chronic hyponatremia. She was given Lasix and had improved respiratory requirements back to baseline.  4/29: Volume status improved, sodium improved but still low (127->128). Encouraged PO intake. Discharged to CIR in stable condition.   A & P   Principal Problem:   Closed right hip fracture, initial encounter [facility]) Active Problems:   HYPERTENSION, BENIGN   A-fib (HCC)   Congestive heart failure (HCC)   ILD (interstitial lung disease) (HCC)     Acute impacted subcapital fracture of proximal right femur secondary to mechanical fall from standing height, s/p right hip hemiarthroplasty with Dr. Lorin Mercy (10/28/2019) a. Discharged to CIR b. Norco and bowel regimen at discharge c. Follow up with Ortho  outpatient  Acute on chronic hypoxic respiratory failure, secondary to atelectasis and volume overload a. History of Chronic ILD on 2 L nasal cannula nightly at baseline (has been on 2L/min during the day here but might not actually need it - this should be titrated off) b. 4/28-29 Overnight required 2-> 3->5 L/min O2 without much improvement with incentive spirometer. Noted patient had been receiving high volumes of half-normal saline. CXR with improved findings from prior and concern of mild atelectasis. Weight increased from 87.1->88.9 kg as well as +2870 intake. Discontinued half-normal saline and was given an dose of Lasix 40 mg IV. Volume status improved and respiratory status improved to at/near baseline. c. Encourage Incentive spirometry. Instructed on proper technique at bedside d. Monitor volume status daily and avoid large volumes of fluids if possible  Acute on chronic hyponatremia, secondary to 1/2 normal saline e. Improved with diuresis and holding IV fluids f. Slightly on the dry side today but sensitive to IV fluids.  g. Advised to increase PO salt intake today and follow up in AM  Permanent atrial fibrillation a. INR at goal b. Continue home Coumadin  c. Continue bisoprolol and Cardizem  Leukocytosis, suspect reactive due to recent surgery 1. Afebrile and hemodynamically stable without signs of infection 2. Continue to monitor  Hypertension a. Continue current regimen  Chronic diastolic heart failure, with mild volume overload a. Echo today: EF 60 to 65% with moderately reduced RV systolic function and moderately enlarged RV with moderately elevated pulmonary artery systolic pressure b. I/O and daily weights with fluid restriction c. Continue home medications and Lasix as above  Hypothyroidism on levothyroxine  Hyperlipidemia on statin  Depression  on fluoxetine, as needed?  Proteinuria a. Continue ARB b. Consider outpatient nephrology at  discharge  Abnormal CXR resolved a. CXR 4/27 with new superimposed confluent airspace consolidation in LUL concerning for superimposed pneumonia number patient is afebrile and without cough so this is unlikely pneumonia at this time.  Resolved on repeat x-ray 4/29    Consultants  . ortho  Procedures   s/p right hip hemiarthroplasty with Dr. Lorin Mercy (10/28/2019)  Antibiotics   Anti-infectives (From admission, onward)   Start     Dose/Rate Route Frequency Ordered Stop   10/28/19 1400  ceFAZolin (ANCEF) IVPB 2g/100 mL premix     2 g 200 mL/hr over 30 Minutes Intravenous On call to O.R. 10/28/19 [id number] 10/28/19 1545       Subjective  Patient seen and examined at bedside no acute distress and resting comfortably. Family at bedside. No events overnight.  Tolerating diet. In good spirits and anticipating discharge.   Denies any chest pain, shortness of breath, fever, nausea, vomiting, urinary or bowel complaints. Otherwise ROS negative    Objective   Discharge Exam: Vitals:   10/29/19 2142 10/30/19 0551  BP: (!) 103/48 129/63  Pulse: (!) 57 (!) 57  Resp: 18 18  Temp: 98 F (36.7 C) 97.7 F (36.5 C)  SpO2: 92% 98%   Vitals:   10/29/19 2049 10/29/19 2142 10/30/19 0500 10/30/19 0551  BP:  (!) 103/48  129/63  Pulse:  (!) 57  (!) 57  Resp:  18  18  Temp:  98 F (36.7 C)  97.7 F (36.5 C)  TempSrc:  Oral  Oral  SpO2: 94% 92%  98%  Weight:   86.7 kg   Height:        Physical Exam Vitals and nursing note reviewed.  Constitutional:      Appearance: Normal appearance.  HENT:     Head: Normocephalic and atraumatic.     Mouth/Throat:     Mouth: Mucous membranes are dry.  Eyes:     Conjunctiva/sclera: Conjunctivae normal.  Cardiovascular:     Rate and Rhythm: Normal rate and regular rhythm.  Pulmonary:     Effort: Pulmonary effort is normal.     Breath sounds: Normal breath sounds. No wheezing or rales.     Comments: On 2L/min Abdominal:     General: Abdomen is  flat.     Palpations: Abdomen is soft.  Musculoskeletal:        General: No swelling.  Skin:    Coloration: Skin is not jaundiced or pale.  Neurological:     Mental Status: She is alert. Mental status is at baseline.  Psychiatric:        Mood and Affect: Mood normal.        Behavior: Behavior normal.       The results of significant diagnostics from this hospitalization (including imaging, microbiology, ancillary and laboratory) are listed below for reference.     Microbiology: Recent Results (from the past 240 hour(s))  Respiratory Panel by RT PCR (Flu A&B, Covid) - Nasopharyngeal Swab     Status: None   Collection Time: 10/27/19  1:12 PM   Specimen: Nasopharyngeal Swab  Result Value Ref Range Status   SARS Coronavirus 2 by RT PCR NEGATIVE NEGATIVE Final    Comment: (NOTE) SARS-CoV-2 target nucleic acids are NOT DETECTED. The SARS-CoV-2 RNA is generally detectable in upper respiratoy specimens during the acute phase of infection. The lowest concentration of SARS-CoV-2 viral copies this assay can detect is  131 copies/mL. A negative result does not preclude SARS-Cov-2 infection and should not be used as the sole basis for treatment or other patient management decisions. A negative result may occur with  improper specimen collection/handling, submission of specimen other than nasopharyngeal swab, presence of viral mutation(s) within the areas targeted by this assay, and inadequate number of viral copies (<131 copies/mL). A negative result must be combined with clinical observations, patient history, and epidemiological information. The expected result is Negative. Fact Sheet for Patients:  PinkCheek.be Fact Sheet for Healthcare Providers:  GravelBags.it This test is not yet ap proved or cleared by the [place] FDA and  has been authorized for detection and/or diagnosis of SARS-CoV-2 by FDA under an Emergency Use  Authorization (EUA). This EUA will remain  in effect (meaning this test can be used) for the duration of the COVID-19 declaration under Section 564(b)(1) of the Act, 21 U.S.C. section 360bbb-3(b)(1), unless the authorization is terminated or revoked sooner.    Influenza A by PCR NEGATIVE NEGATIVE Final   Influenza B by PCR NEGATIVE NEGATIVE Final    Comment: (NOTE) The Xpert Xpress SARS-CoV-2/FLU/RSV assay is intended as an aid in  the diagnosis of influenza from Nasopharyngeal swab specimens and  should not be used as a sole basis for treatment. Nasal washings and  aspirates are unacceptable for Xpert Xpress SARS-CoV-2/FLU/RSV  testing. Fact Sheet for Patients: PinkCheek.be Fact Sheet for Healthcare Providers: GravelBags.it This test is not yet approved or cleared by the [place] FDA and  has been authorized for detection and/or diagnosis of SARS-CoV-2 by  FDA under an Emergency Use Authorization (EUA). This EUA will remain  in effect (meaning this test can be used) for the duration of the  Covid-19 declaration under Section 564(b)(1) of the Act, 21  U.S.C. section 360bbb-3(b)(1), unless the authorization is  terminated or revoked. Performed at [facility], [place] [address], [place], [place] [address]   Surgical PCR screen     Status: None   Collection Time: 10/28/19  3:00 AM   Specimen: Nasal Mucosa; Nasal Swab  Result Value Ref Range Status   MRSA, PCR NEGATIVE NEGATIVE Final   Staphylococcus aureus NEGATIVE NEGATIVE Final    Comment: (NOTE) The Xpert SA Assay (FDA approved for NASAL specimens in patients 24 years of age and older), is one component of a comprehensive surveillance program. It is not intended to diagnose infection nor to guide or monitor treatment. Performed at [facility], [place] [address] [place], [place] [address]   Urine culture     Status: Abnormal    Collection Time: 10/28/19  6:09 AM   Specimen: Urine, Clean Catch  Result Value Ref Range Status   Specimen Description   Final    URINE, CLEAN CATCH Performed at [facility], [place] [address], [place], [place] [address]    Special Requests   Final    NONE Performed at [facility], [place] [address] [place], [place] [address]    Culture MULTIPLE SPECIES PRESENT, SUGGEST RECOLLECTION (A)  Final   Report Status 10/29/2019 FINAL  Final     Labs: BNP (last 3 results) No results for input(s): BNP in the last 8760 hours. Basic Metabolic Panel: Recent Labs  Lab 10/27/19 1200 10/28/19 0250 10/29/19 0309 10/30/19 0313  NA 132* 129* 127* 128*  K 4.4 4.7 4.4 4.1  CL 99 97* 97* 97*  CO2 21* 22 22 21*  GLUCOSE 146* 117* 146* 125*  BUN 12 13 20  34*  CREATININE 0.54 0.66 0.69 1.00  CALCIUM 8.8* 8.5* 8.3* 8.4*   Liver Function Tests: Recent Labs  Lab 10/27/19 1200  AST 30  ALT 18  ALKPHOS 100  BILITOT 1.0  PROT 6.6  ALBUMIN 3.4*   No results for input(s): LIPASE, AMYLASE in the last 168 hours. No results for input(s): AMMONIA in the last 168 hours. CBC: Recent Labs  Lab 10/27/19 1200 10/28/19 0250 10/29/19 0309 10/30/19 0313  WBC 15.6* 13.8* 9.7 14.5*  NEUTROABS 14.2*  --   --   --   HGB 13.1 11.7* 10.7* 10.7*  HCT 41.7 37.4 35.7* 34.7*  MCV 96.8 97.4 100.3* 98.3  PLT 253 206 171 178   Cardiac Enzymes: No results for input(s): CKTOTAL, CKMB, CKMBINDEX, TROPONINI in the last 168 hours. BNP: Invalid input(s): POCBNP CBG: No results for input(s): GLUCAP in the last 168 hours. D-Dimer No results for input(s): DDIMER in the last 72 hours. Hgb A1c No results for input(s): HGBA1C in the last 72 hours. Lipid Profile No results for input(s): CHOL, HDL, LDLCALC, TRIG, CHOLHDL, LDLDIRECT in the last 72 hours. Thyroid function studies No results for input(s): TSH, T4TOTAL, T3FREE, THYROIDAB in the last 72 hours.  Invalid input(s):  FREET3 Anemia work up No results for input(s): VITAMINB12, FOLATE, FERRITIN, TIBC, IRON, RETICCTPCT in the last 72 hours. Urinalysis    Component Value Date/Time   COLORURINE AMBER (A) 10/28/2019 0608   APPEARANCEUR HAZY (A) 10/28/2019 0608   LABSPEC 1.023 10/28/2019 0608   PHURINE 6.0 10/28/2019 0608   GLUCOSEU NEGATIVE 10/28/2019 0608   HGBUR MODERATE (A) 10/28/2019 0608   BILIRUBINUR NEGATIVE 10/28/2019 0608   BILIRUBINUR Neg 02/06/2012 1022   KETONESUR NEGATIVE 10/28/2019 0608   PROTEINUR >=300 (A) 10/28/2019 0608   UROBILINOGEN 0.2 04/07/2012 1443   NITRITE NEGATIVE 10/28/2019 0608   LEUKOCYTESUR SMALL (A) 10/28/2019 0608   Sepsis Labs Invalid input(s): PROCALCITONIN,  WBC,  LACTICIDVEN Microbiology Recent Results (from the past 240 hour(s))  Respiratory Panel by RT PCR (Flu A&B, Covid) - Nasopharyngeal Swab     Status: None   Collection Time: 10/27/19  1:12 PM   Specimen: Nasopharyngeal Swab  Result Value Ref Range Status   SARS Coronavirus 2 by RT PCR NEGATIVE NEGATIVE Final    Comment: (NOTE) SARS-CoV-2 target nucleic acids are NOT DETECTED. The SARS-CoV-2 RNA is generally detectable in upper respiratoy specimens during the acute phase of infection. The lowest concentration of SARS-CoV-2 viral copies this assay can detect is 131 copies/mL. A negative result does not preclude SARS-Cov-2 infection and should not be used as the sole basis for treatment or other patient management decisions. A negative result may occur with  improper specimen collection/handling, submission of specimen other than nasopharyngeal swab, presence of viral mutation(s) within the areas targeted by this assay, and inadequate number of viral copies (<131 copies/mL). A negative result must be combined with clinical observations, patient history, and epidemiological information. The expected result is Negative. Fact Sheet for Patients:  PinkCheek.be Fact Sheet for  Healthcare Providers:  GravelBags.it This test is not yet ap proved or cleared by the [place] FDA and  has been authorized for detection and/or diagnosis of SARS-CoV-2 by FDA under an Emergency Use Authorization (EUA). This EUA will remain  in effect (meaning this test can be used) for the duration of the COVID-19 declaration under Section 564(b)(1) of the Act, 21 U.S.C. section 360bbb-3(b)(1), unless the authorization is terminated or revoked sooner.  Influenza A by PCR NEGATIVE NEGATIVE Final   Influenza B by PCR NEGATIVE NEGATIVE Final    Comment: (NOTE) The Xpert Xpress SARS-CoV-2/FLU/RSV assay is intended as an aid in  the diagnosis of influenza from Nasopharyngeal swab specimens and  should not be used as a sole basis for treatment. Nasal washings and  aspirates are unacceptable for Xpert Xpress SARS-CoV-2/FLU/RSV  testing. Fact Sheet for Patients: PinkCheek.be Fact Sheet for Healthcare Providers: GravelBags.it This test is not yet approved or cleared by the [place] FDA and  has been authorized for detection and/or diagnosis of SARS-CoV-2 by  FDA under an Emergency Use Authorization (EUA). This EUA will remain  in effect (meaning this test can be used) for the duration of the  Covid-19 declaration under Section 564(b)(1) of the Act, 21  U.S.C. section 360bbb-3(b)(1), unless the authorization is  terminated or revoked. Performed at [facility], [place] [address] [place], [place] [address]   Surgical PCR screen     Status: None   Collection Time: 10/28/19  3:00 AM   Specimen: Nasal Mucosa; Nasal Swab  Result Value Ref Range Status   MRSA, PCR NEGATIVE NEGATIVE Final   Staphylococcus aureus NEGATIVE NEGATIVE Final    Comment: (NOTE) The Xpert SA Assay (FDA approved for NASAL specimens in patients 88 years of age and older), is one component of a  comprehensive surveillance program. It is not intended to diagnose infection nor to guide or monitor treatment. Performed at [facility], [place] [address], [place], [place] [address]   Urine culture     Status: Abnormal   Collection Time: 10/28/19  6:09 AM   Specimen: Urine, Clean Catch  Result Value Ref Range Status   Specimen Description   Final    URINE, CLEAN CATCH Performed at [facility], [place] [address], [place], [place] [address]    Special Requests   Final    NONE Performed at [facility], [place] [address], [place],  [address]    Culture MULTIPLE SPECIES PRESENT, SUGGEST RECOLLECTION (A)  Final   Report Status 10/29/2019 FINAL  Final    Discharge Instructions     Discharge Instructions    Discharge instructions   Complete by: As directed    - Continue with Norco as needed with bowel regimen - Encourage incentive spirometry for atelectasis - Encourage oral salt intake for one day with follow up labs - follow up with orthopedic surgery, to be arranged   Increase activity slowly   Complete by: As directed      Allergies as of 10/30/2019      Reactions   Doxycycline    Rash on face and neck   Flagyl [metronidazole] Nausea And Vomiting   Moxifloxacin Other (See Comments)    Weakness/fatigue   Pantoprazole Sodium Rash   Penicillins Rash   Has patient had a PCN reaction causing immediate rash, facial/tongue/throat swelling, SOB or lightheadedness with hypotension:unsure Has patient had a PCN reaction causing severe rash involving mucus membranes or skin necrosis:unsure Has patient had a PCN reaction that required hospitalization:No Has patient had a PCN reaction occurring within the last 10 years:Yes Cannot recall exact reaction due to time lapse If all of the above answers are "NO", then may proceed with Cephalosporin use.      Medication List    STOP taking these medications   cefUROXime 250 MG  tablet Commonly known as: CEFTIN     TAKE these medications  Biotin 5000 MCG Tabs Take 5,000 mcg by mouth daily.   bisoprolol 10 MG tablet Commonly known as: ZEBETA Take 1 tablet by mouth daily. Please monitor your blood pressure and heart rate daily for a couple of weeks to make sure they remain stable. What changed:   how much to take  how to take this  when to take this  additional instructions   dextromethorphan-guaiFENesin 30-600 MG 12hr tablet Commonly known as: MUCINEX DM Take 1-2 tablets by mouth 2 (two) times daily as needed (for allegies/respiratory issues.).   diltiazem 240 MG 24 hr capsule Commonly known as: CARDIZEM CD Take 1 capsule (240 mg total) by mouth daily. What changed: when to take this   diphenhydrAMINE 25 MG tablet Commonly known as: BENADRYL Take 25 mg by mouth every 6 (six) hours as needed for allergies.   docusate sodium 100 MG capsule Commonly known as: COLACE Take 1 capsule (100 mg total) by mouth 2 (two) times daily.   Flonase 50 MCG/ACT nasal spray Generic drug: fluticasone Place 1 spray into both nostrils 2 (two) times daily as needed (for nasal congestion).   FLUoxetine 10 MG tablet Commonly known as: PROZAC Take 10 mg by mouth 2 (two) times daily as needed (anxiety).   folic acid [id number] MCG tablet Commonly known as: FOLVITE Take 1 mg by mouth 3 (three) times a week. MWF   furosemide 20 MG tablet Commonly known as: LASIX Take 20 mg by mouth daily as needed for fluid.   HYDROcodone-acetaminophen 5-325 MG tablet Commonly known as: NORCO/VICODIN Take 1-2 tablets by mouth every 6 (six) hours as needed for moderate pain.   levothyroxine 50 MCG tablet Commonly known as: SYNTHROID Take 1 tablet (50 mcg total) by mouth daily. What changed: when to take this   olmesartan 40 MG tablet Commonly known as: BENICAR Take 40 mg by mouth daily.   omeprazole 20 MG capsule Commonly known as: PRILOSEC TAKE 1 CAPSULE(20 MG) BY MOUTH  DAILY What changed: See the new instructions.   pravastatin 40 MG tablet Commonly known as: PRAVACHOL Take 40 mg by mouth at bedtime.   Symbicort 80-4.5 MCG/ACT inhaler Generic drug: budesonide-formoterol INHALE 2 PUFFS INTO THE LUNGS TWICE DAILY What changed: when to take this   vitamin B-12 1000 MCG tablet Commonly known as: CYANOCOBALAMIN Take 1,000 mcg by mouth 3 (three) times a week. MWF   Vitamin D-3 125 MCG (5000 UT) Tabs Take 5,000 Units by mouth daily.   warfarin 2.5 MG tablet Commonly known as: COUMADIN Take 1/2-1 tablet by mouth daily as directed by [facility]mad[facility] Clinic What changed:   how much to take  how to take this  when to take this  additional instructions   zinc gluconate 50 MG tablet Take 50 mg by mouth daily.      Follow-up Information    Marybelle Killings, MD Follow up in 2 week(s).   Specialty: Orthopedic Surgery Contact information: [address] [place]  [address] [phone]          Allergies  Allergen Reactions  . Doxycycline     Rash on face and neck  . Flagyl [Metronidazole] Nausea And Vomiting  . Moxifloxacin Other (See Comments)     Weakness/fatigue  . Pantoprazole Sodium Rash  . Penicillins Rash    Has patient had a PCN reaction causing immediate rash, facial/tongue/throat swelling, SOB or lightheadedness with hypotension:unsure Has patient had a PCN reaction causing severe rash involving mucus membranes or skin necrosis:unsure Has patient had a  PCN reaction that required hospitalization:No Has patient had a PCN reaction occurring within the last 10 years:Yes Cannot recall exact reaction due to time lapse If all of the above answers are "NO", then may proceed with Cephalosporin use.    Dispo: The patient is from: Home              Anticipated d/c is to: CIR              Anticipated d/c date is: today              Patient currently is medically stable to d/c.        Time coordinating discharge: Over 30 minutes    SIGNED:   Harold Hedge, D.O. [facility] Pager: [phone]  10/30/2019, 1:05 PM

## 2019-10-30 NOTE — H&P (Addendum)
Physical Medicine and Rehabilitation Admission H&P    Chief Complaint  Patient presents with  . Functional decline due to fall with hip fracture.    HPI: Stephanie Cortez. Stephanie Cortez is an 86 year old female with history of CHF-Coumadin, chronic diastolic HF, HTN, depression, neuropathy, COPD/bronchiectasis--2L oxygen dependent nightly; who tripped and fell over her dog night prior to admission on 10/27/2019 with progressive right hip pain.  History taken from chart review and patient.  CT head unremarkable for acute intracranial process.  Imaging revealed right femur fracture.  She was found to have subcapital fracture proximal right femur and underwent right hip hemi by Dr. Lorin Mercy on 10/28/2019. Post op to be WBAT and Coumadin resumed.  Hospital course complicated by hypotension, acute on chronic hyponatremia, as well as increase in oxygen needs (upto 5 L) felt to be due to fluid overload. IVF were d/ced and she received IV Lasix X 1 with improvement in respiratory status. Per nursing reports, she has had issues with sundowning also. Therapy evaluations completed revealing patients with decreased endurance, dizziness with activity, problems with sequencing and poor safety awareness.  CIR recommended due to functional decline.  Please see preadmission assessment from earlier today as well.  Review of Systems  Constitutional: Negative for chills and fever.  HENT: Negative for hearing loss and tinnitus.   Eyes: Negative for blurred vision and double vision.  Respiratory: Positive for shortness of breath (at baseline).   Cardiovascular: Positive for leg swelling. Negative for chest pain and palpitations.  Gastrointestinal: Positive for constipation (no BM since admission). Negative for heartburn and nausea.       Feels that she needs to get esophagus dilatated again.   Musculoskeletal: Positive for joint pain and myalgias.  Neurological: Positive for weakness.  Psychiatric/Behavioral: The patient is not  nervous/anxious.     Past Medical History:  Diagnosis Date  . Anemia    - Hgb 9.7gm% on 07/13/2008 in [place] -  Hgg 129gm% wiht normal irone levsl and ferritin 10/27/2008 in [place] Recurrent otitis/sinusitis  . Anxiety    chronic BZ prn  . Atrial fibrillation (HCC)    chronic anticoag  . Bronchiectasis    >PFT 07/13/2008 in [place] 1.9L/76%, FVC 2.45L/74, Ratio 79, TLC 121%, DLCO 64%  AE BRonchiectasis - Dec 2010.New Rx:  outpatient - Feb 2011 - Rx outpatient  . CHF (congestive heart failure) ([place])   . COPD (chronic obstructive pulmonary disease) (HCC)    bronchiectasis  . Cricopharyngeal achalasia   . Depression   . Diverticulosis   . Dyslipidemia   . Eczema   . GERD with stricture   . Glaucoma   . H. pylori infection   . Hypertension   . Hyponatremia    chronic, s/p endo eval 06/2012  . Hypothyroid   . IBS (irritable bowel syndrome)   . Lumbar disc disease   . Neuropathy of both feet   . Segmental colitis ([place])   . Vertigo   . Wears glasses     Past Surgical History:  Procedure Laterality Date  . BREAST SURGERY     br bx  . CARDIAC CATHETERIZATION  07/01/2013  . CATARACT EXTRACTION     both  . COLONOSCOPY    . ESOPHAGOSCOPY W/ BOTOX INJECTION  12/11/2011   Procedure: ESOPHAGOSCOPY WITH BOTOX INJECTION;  Surgeon: Rozetta Nunnery, MD;  Location: [facility];  Service: ENT;  Laterality: N/A;  esophogoscopy with dilation, botox injection  . FOOT SURGERY  03/14/2011  gastroc slide-rt  . HIP ARTHROPLASTY Right 10/28/2019   Procedure: ARTHROPLASTY BIPOLAR HIP (HEMIARTHROPLASTY);  Surgeon: Marybelle Killings, MD;  Location: [facility];  Service: Orthopedics;  Laterality: Right;  . TOTAL ABDOMINAL HYSTERECTOMY    . WISDOM TOOTH EXTRACTION      Family History  Problem Relation Age of Onset  . Hypertension Mother   . Stroke Mother   . Emphysema Brother   . Other Father        miner's lung  . Cancer Father        Lung  . Colon polyps Sister   . Pancreatic  cancer Sister   . Kidney disease Sister   . Atrial fibrillation Other        siblings    Social History:  Lives alone--separated. Worked in Editor, commissioning taught it . Likes to read old historical novels and walk her 84 year old cocker spaniel. She was independent PTA. She per reports that she has never smoked. She has never used smokeless tobacco. She reports that she does not drink alcohol or use drugs.    Allergies  Allergen Reactions  . Doxycycline     Rash on face and neck  . Flagyl [Metronidazole] Nausea And Vomiting  . Moxifloxacin Other (See Comments)     Weakness/fatigue  . Pantoprazole Sodium Rash  . Penicillins Rash    Has patient had a PCN reaction causing immediate rash, facial/tongue/throat swelling, SOB or lightheadedness with hypotension:unsure Has patient had a PCN reaction causing severe rash involving mucus membranes or skin necrosis:unsure Has patient had a PCN reaction that required hospitalization:No Has patient had a PCN reaction occurring within the last 10 years:Yes Cannot recall exact reaction due to time lapse If all of the above answers are "NO", then may proceed with Cephalosporin use.     Medications Prior to Admission  Medication Sig Dispense Refill  . Biotin 5000 MCG TABS Take 5,000 mcg by mouth daily.    . bisoprolol (ZEBETA) 10 MG tablet Take 1 tablet by mouth daily. Please monitor your blood pressure and heart rate daily for a couple of weeks to make sure they remain stable. (Patient taking differently: Take 10 mg by mouth daily. Please monitor your blood pressure and heart rate daily for a couple of weeks to make sure they remain stable.) 90 tablet 1  . cefUROXime (CEFTIN) 250 MG tablet Take 250 mg by mouth 2 (two) times daily.    . Cholecalciferol (VITAMIN D-3) 125 MCG (5000 UT) TABS Take 5,000 Units by mouth daily.     [name] Kitchen dextromethorphan-guaiFENesin (MUCINEX DM) 30-600 MG 12hr tablet Take 1-2 tablets by mouth 2 (two) times daily as needed (for  allegies/respiratory issues.).    [name] Kitchen diltiazem (CARDIZEM CD) 240 MG 24 hr capsule Take 1 capsule (240 mg total) by mouth daily. (Patient taking differently: Take 240 mg by mouth at bedtime. ) 90 capsule 1  . diphenhydrAMINE (BENADRYL) 25 MG tablet Take 25 mg by mouth every 6 (six) hours as needed for allergies.    [name] Kitchen FLUoxetine (PROZAC) 10 MG tablet Take 10 mg by mouth 2 (two) times daily as needed (anxiety).     . fluticasone (FLONASE) 50 MCG/ACT nasal spray Place 1 spray into both nostrils 2 (two) times daily as needed (for nasal congestion).    . folic acid (FOLVITE) A999333 MCG tablet Take 1 mg by mouth 3 (three) times a week. MWF    . furosemide (LASIX) 20 MG tablet Take 20 mg by mouth daily  as needed for fluid.     [name] Kitchen levothyroxine (SYNTHROID, LEVOTHROID) 50 MCG tablet Take 1 tablet (50 mcg total) by mouth daily. (Patient taking differently: Take 50 mcg by mouth daily before breakfast. ) 90 tablet 3  . olmesartan (BENICAR) 40 MG tablet Take 40 mg by mouth daily.    [name] Kitchen omeprazole (PRILOSEC) 20 MG capsule TAKE 1 CAPSULE(20 MG) BY MOUTH DAILY (Patient taking differently: Take 20 mg by mouth daily. ) 90 capsule 4  . pravastatin (PRAVACHOL) 40 MG tablet Take 40 mg by mouth at bedtime.     . SYMBICORT 80-4.5 MCG/ACT inhaler INHALE 2 PUFFS INTO THE LUNGS TWICE DAILY (Patient taking differently: Inhale 2 puffs into the lungs in the morning and at bedtime. ) 10.2 g 3  . vitamin B-12 (CYANOCOBALAMIN) 1000 MCG tablet Take 1,000 mcg by mouth 3 (three) times a week. MWF    . warfarin (COUMADIN) 2.5 MG tablet Take 1/2-1 tablet by mouth daily as directed by [facility] (Patient taking differently: Take 1.25-2.5 mg by mouth See admin instructions. Take 1 tablet (2.5 mg)  Tuesday Thursday & Saturday Sunday Take 0.5 tablet (1.25 mg) Monday. Wednesday & Friday) 90 tablet 1  . zinc gluconate 50 MG tablet Take 50 mg by mouth daily.      Drug Regimen Review  Drug regimen was reviewed and remains appropriate with no  significant issues identified  Home: Home Living Family/patient expects to be discharged to:: Private residence Living Arrangements: Alone Available Help at Discharge: Family Type of Home: House Home Access: Stairs to enter [employer] of Steps: 2 front, 3 in back Entrance Stairs-Rails: None Home Layout: One level Bathroom Shower/Tub: Multimedia programmer: Handicapped height Home Equipment: Grab bars - tub/shower, Shower seat - built in   Functional History: Prior Function Level of Independence: Independent Comments: still drives  Functional Status:  Mobility: Bed Mobility Overal bed mobility: Needs Assistance Bed Mobility: Supine to Sit Supine to sit: Mod assist, +2 for physical assistance, +2 for safety/equipment Sit to supine: Mod assist, Max assist, +2 for physical assistance, +2 for safety/equipment General bed mobility comments: Increased time with cues for sequence and physical assist to manage R LE and control trunk Transfers Overall transfer level: Needs assistance Equipment used: Rolling walker (2 wheeled) Transfers: Sit to/from Stand Sit to Stand: Min assist, Mod assist, +2 physical assistance, +2 safety/equipment, From elevated surface General transfer comment: max cues for sequencing, proper hand placement and body mechanics Ambulation/Gait Ambulation/Gait assistance: Min assist, Mod assist, +2 safety/equipment Gait Distance (Feet): 24 Feet Assistive device: Rolling walker (2 wheeled) Gait Pattern/deviations: Step-to pattern, Decreased step length - right, Decreased step length - left, Shuffle, Trunk flexed General Gait Details: Step-be-step cues for sequence, foot placement and UE WB.   Gait velocity: decr    ADL: ADL Overall ADL's : Needs assistance/impaired Grooming: Set up, Sitting Upper Body Bathing: Sitting, Set up Lower Body Bathing: Maximal assistance, Sitting/lateral leans, Sit to/from stand Upper Body Dressing : Set up,  Sitting Lower Body Dressing: Maximal assistance, Total assistance, Sitting/lateral leans, Sit to/from stand Lower Body Dressing Details (indicate cue type and reason): total A don socks due to precautions, initiated education regarding LB AE  Toilet Transfer: Minimal assistance, Moderate assistance, +2 for physical assistance, +2 for safety/equipment, BSC, RW, Ambulation, Cueing for safety, Cueing for sequencing, Adhering to hip precautions Toilet Transfer Details (indicate cue type and reason): simulated with functional mobility and reclincer transfer, requires step by step cues for sequencing movement of walker,  LEs to take steps Toileting- Clothing Manipulation and Hygiene: Maximal assistance, Total assistance, Sitting/lateral lean, Sit to/from stand Functional mobility during ADLs: Moderate assistance, Minimal assistance, Cueing for safety, Cueing for sequencing, +2 for safety/equipment, +2 for physical assistance, Rolling walker General ADL Comments: Patient requires increased assistance with self care due to decreased strength, balance, safety awareness, knowledge of precautions  Cognition: Cognition Overall Cognitive Status: Within Functional Limits for tasks assessed Orientation Level: Oriented to person, Oriented to place, Oriented to situation, Disoriented to time Cognition Arousal/Alertness: Awake/alert Behavior During Therapy: WFL for tasks assessed/performed Overall Cognitive Status: Within Functional Limits for tasks assessed General Comments: some difficulty with sequencing however suspect due to post op day 1/medications   Blood pressure 129/63, pulse (!) 57, temperature 97.7 F (36.5 C), temperature source Oral, resp. rate 18, height 5\' 6"  (1.676 m), weight 86.7 kg, SpO2 98 %. Physical Exam  Nursing note and vitals reviewed. Constitutional: She is oriented to person, place, and time. She appears well-developed and well-nourished.  On 2 L oxygen per [place]  HENT:  Head:  Normocephalic and atraumatic.  Eyes: EOM are normal. Right eye exhibits chemosis. Right eye exhibits no discharge. Left eye exhibits chemosis. Left eye exhibits no discharge.  Neck: No tracheal deviation present. No thyromegaly present.  Cardiovascular: An irregularly irregular rhythm present.  Respiratory: Effort normal. No stridor. No respiratory distress.  + [place]  GI: Soft. She exhibits no distension.  Musculoskeletal:     Comments: Right hip with edema and tenderness   Neurological: She is alert and oriented to person, place, and time.  Motor: Bilateral upper extremities: 5/5 proximal distal Left lower extremity: 3-3+/5 hip flexion, knee extension, 4/5 ankle dorsiflexion Right lower extremity: Hip flexion, knee extension 2+/5, ankle dorsiflexion 4/5 Sensation intact light touch  Skin:  Right hip with dressing C/D/I  Psychiatric: She has a normal mood and affect. Her behavior is normal. Thought content normal.    Results for orders placed or performed during the hospital encounter of 10/27/19 (from the past 48 hour(s))  CBC     Status: Abnormal   Collection Time: 10/29/19  3:09 AM  Result Value Ref Range   WBC 9.7 4.0 - 10.5 K/uL   RBC 3.56 (L) 3.87 - 5.11 MIL/uL   Hemoglobin 10.7 (L) 12.0 - 15.0 g/dL   HCT 35.7 (L) 36.0 - 46.0 %   MCV 100.3 (H) 80.0 - 100.0 fL   MCH 30.1 26.0 - 34.0 pg   MCHC 30.0 30.0 - 36.0 g/dL   RDW 13.7 11.5 - 15.5 %   Platelets 171 150 - 400 K/uL   nRBC 0.0 0.0 - 0.2 %    Comment: Performed at [facility], [place] [address] [place], [place] [id number]  Basic metabolic panel     Status: Abnormal   Collection Time: 10/29/19  3:09 AM  Result Value Ref Range   Sodium 127 (L) 135 - 145 mmol/L   Potassium 4.4 3.5 - 5.1 mmol/L   Chloride 97 (L) 98 - 111 mmol/L   CO2 22 22 - 32 mmol/L   Glucose, Bld 146 (H) 70 - 99 mg/dL    Comment: Glucose reference range applies only to samples taken after fasting for at least 8 hours.   BUN 20 8 - 23  mg/dL   Creatinine, Ser 0.69 0.44 - 1.00 mg/dL   Calcium 8.3 (L) 8.9 - 10.3 mg/dL   GFR calc non Af Amer >60 >60 mL/min   GFR calc Af Amer >  60 >60 mL/min   Anion gap 8 5 - 15    Comment: Performed at [facility], [place] [address] [place], [place] [address]  Protime-INR     Status: Abnormal   Collection Time: 10/29/19  3:09 AM  Result Value Ref Range   Prothrombin Time 18.9 (H) 11.4 - 15.2 seconds   INR 1.6 (H) 0.8 - 1.2    Comment: (NOTE) INR goal varies based on device and disease states. Performed at [facility], [place] [address] [place], [place] [address]   Protime-INR     Status: Abnormal   Collection Time: 10/30/19  3:13 AM  Result Value Ref Range   Prothrombin Time 24.9 (H) 11.4 - 15.2 seconds   INR 2.3 (H) 0.8 - 1.2    Comment: (NOTE) INR goal varies based on device and disease states. Performed at [facility], [place] [address] [place], [place] [address]   CBC     Status: Abnormal   Collection Time: 10/30/19  3:13 AM  Result Value Ref Range   WBC 14.5 (H) 4.0 - 10.5 K/uL   RBC 3.53 (L) 3.87 - 5.11 MIL/uL   Hemoglobin 10.7 (L) 12.0 - 15.0 g/dL   HCT 34.7 (L) 36.0 - 46.0 %   MCV 98.3 80.0 - 100.0 fL   MCH 30.3 26.0 - 34.0 pg   MCHC 30.8 30.0 - 36.0 g/dL   RDW 13.6 11.5 - 15.5 %   Platelets 178 150 - 400 K/uL   nRBC 0.0 0.0 - 0.2 %    Comment: Performed at [facility], [place] [address] [place], [place] [id number]  Basic metabolic panel     Status: Abnormal   Collection Time: 10/30/19  3:13 AM  Result Value Ref Range   Sodium 128 (L) 135 - 145 mmol/L   Potassium 4.1 3.5 - 5.1 mmol/L   Chloride 97 (L) 98 - 111 mmol/L   CO2 21 (L) 22 - 32 mmol/L   Glucose, Bld 125 (H) 70 - 99 mg/dL    Comment: Glucose reference range applies only to samples taken after fasting for at least 8 hours.   BUN 34 (H) 8 - 23 mg/dL   Creatinine, Ser 1.00 0.44 - 1.00 mg/dL   Calcium 8.4 (L) 8.9 - 10.3 mg/dL   GFR  calc non Af Amer 52 (L) >60 mL/min   GFR calc Af Amer 60 (L) >60 mL/min   Anion gap 10 5 - 15    Comment: Performed at [facility], [place] [address], [place], [place] [address]   Pelvis Portable  Result Date: 10/28/2019 CLINICAL DATA:  Right hip replacement EXAM: PORTABLE PELVIS 1-2 VIEWS COMPARISON:  None. FINDINGS: Interval right total hip arthroplasty. Expected appearance on this single view. IMPRESSION: Post right total hip arthroplasty. Electronically Signed   By: Macy Mis M.D.   On: 10/28/2019 19:20   DG CHEST PORT 1 VIEW  Result Date: 10/29/2019 CLINICAL DATA:  Postoperative right hip arthroplasty, CHF, hypoxia EXAM: PORTABLE CHEST 1 VIEW COMPARISON:  10/27/2019 FINDINGS: Interval improvement in bilateral interstitial and heterogeneous airspace opacity. Probable left basilar atelectasis or consolidation. Cardiomegaly. Chronic fracture deformity of the proximal left humerus. IMPRESSION: 1. Interval improvement in bilateral interstitial and heterogeneous airspace opacity, most consistent with improved edema. 2.  Probable left basilar atelectasis or consolidation. 3.  Cardiomegaly. Electronically Signed   By: Eddie Candle M.D.   On: 10/29/2019 09:33       Medical  Problem List and Plan: 1.  Decreased endurance, dizziness with activity, problems with sequencing and poor safety awareness secondary to right hip fracture s/p right hip hemiarthroplasty.  -patient may not shower  -ELOS/Goals: 12-16 days/supervision/min a  Admit to CIR  2.  Antithrombotics: -DVT/anticoagulation:  Pharmaceutical: Coumadin  -antiplatelet therapy: N/A 3. Pain Management: Continue tramadol qid with tylenol prn. Will add ice prn for local measures.   Monitor with increased exertion 4. Mood: LCSW to follow for evaluation and support.   -antipsychotic agents: N/A 5. Neuropsych: This patient is capable of making decisions on her own behalf. 6. Skin/Wound Care: Monitor wound daily for  healing.  7. Fluids/Electrolytes/Nutrition: Monitor I/O.  CMP ordered. 8. Hyponatremia: Acute on chronic --baseline 130. Will monitor with strict I/O and monitor lytes daily for now. Added FR if  improvement not seen.   CMP ordered. 9.CAF: Monitor HR tid-on Cardizem, Zebata and coumadin.   Monitor with increased activity. 10. Chronic diastolic CHF: I/Os.  Continue irbesartan, Zebeta and pravachol.   Daily weights. 11. Bronchiectasis/COPD: On Dulera bid  Wean to supplemental oxygen nightly only as tolerated 13. Reactive leucocytosis:   WBC has fluctuated 15.6-->9.7-->14.5, CBC ordered. 14. Pre-renal azotemia: Question due to diuresis.   CMP ordered. 15. GERD/cricopharyngeal achalasia: Resume PPI. Needs to be upright for meals.  16. Constipation: Will add Senna to colace as had not had BM since admission.   Adjust bowel meds as necessary  Bary Leriche, PA-C 10/30/2019  I have personally performed a face to face diagnostic evaluation, including, but not limited to relevant history and physical exam findings, of this patient and developed relevant assessment and plan.  Additionally, I have reviewed and concur with the physician assistant's documentation above.  Delice Lesch, MD, ABPMR  The patient's status has not changed. The original post admission physician evaluation remains appropriate, and any changes from the pre-admission screening or documentation from the acute chart are noted above.   Delice Lesch, MD, ABPMR

## 2019-10-30 NOTE — H&P (Addendum)
Physical Medicine and Rehabilitation Admission H&P    Chief Complaint  Patient presents with  . Functional decline due to fall with hip fracture.    HPI: Stephanie Cortez. Melina Copa is an 86 year old female with history of CHF-Coumadin, chronic diastolic HF, HTN, depression, neuropathy, COPD/bronchiectasis--2L oxygen dependent nightly; who tripped and fell over her dog night prior to admission on 10/27/2019 with progressive right hip pain.  History taken from chart review and patient.  CT head unremarkable for acute intracranial process.  Imaging revealed right femur fracture.  She was found to have subcapital fracture proximal right femur and underwent right hip hemi by Dr. Lorin Mercy on 10/28/2019. Post op to be WBAT and Coumadin resumed.  Hospital course complicated by hypotension, acute on chronic hyponatremia, as well as increase in oxygen needs (upto 5 L) felt to be due to fluid overload. IVF were d/ced and she received IV Lasix X 1 with improvement in respiratory status. Per nursing reports, she has had issues with sundowning also. Therapy evaluations completed revealing patients with decreased endurance, dizziness with activity, problems with sequencing and poor safety awareness.  CIR recommended due to functional decline.  Please see preadmission assessment from earlier today as well.  Review of Systems  Constitutional: Negative for chills and fever.  HENT: Negative for hearing loss and tinnitus.   Eyes: Negative for blurred vision and double vision.  Respiratory: Positive for shortness of breath (at baseline).   Cardiovascular: Positive for leg swelling. Negative for chest pain and palpitations.  Gastrointestinal: Positive for constipation (no BM since admission). Negative for heartburn and nausea.       Feels that she needs to get esophagus dilatated again.   Musculoskeletal: Positive for joint pain and myalgias.  Neurological: Positive for weakness.  Psychiatric/Behavioral: The patient is not  nervous/anxious.     Past Medical History:  Diagnosis Date  . Anemia    - Hgb 9.7gm% on 07/13/2008 in [place] -  Hgg 129gm% wiht normal irone levsl and ferritin 10/27/2008 in [place] Recurrent otitis/sinusitis  . Anxiety    chronic BZ prn  . Atrial fibrillation (HCC)    chronic anticoag  . Bronchiectasis    >PFT 07/13/2008 in [place] 1.9L/76%, FVC 2.45L/74, Ratio 79, TLC 121%, DLCO 64%  AE BRonchiectasis - Dec 2010.New Rx:  outpatient - Feb 2011 - Rx outpatient  . CHF (congestive heart failure) ([place])   . COPD (chronic obstructive pulmonary disease) (HCC)    bronchiectasis  . Cricopharyngeal achalasia   . Depression   . Diverticulosis   . Dyslipidemia   . Eczema   . GERD with stricture   . Glaucoma   . H. pylori infection   . Hypertension   . Hyponatremia    chronic, s/p endo eval 06/2012  . Hypothyroid   . IBS (irritable bowel syndrome)   . Lumbar disc disease   . Neuropathy of both feet   . Segmental colitis ([place])   . Vertigo   . Wears glasses     Past Surgical History:  Procedure Laterality Date  . BREAST SURGERY     br bx  . CARDIAC CATHETERIZATION  07/01/2013  . CATARACT EXTRACTION     both  . COLONOSCOPY    . ESOPHAGOSCOPY W/ BOTOX INJECTION  12/11/2011   Procedure: ESOPHAGOSCOPY WITH BOTOX INJECTION;  Surgeon: Rozetta Nunnery, MD;  Location: [facility];  Service: ENT;  Laterality: N/A;  esophogoscopy with dilation, botox injection  . FOOT SURGERY  03/14/2011  gastroc slide-rt  . HIP ARTHROPLASTY Right 10/28/2019   Procedure: ARTHROPLASTY BIPOLAR HIP (HEMIARTHROPLASTY);  Surgeon: Marybelle Killings, MD;  Location: [facility];  Service: Orthopedics;  Laterality: Right;  . TOTAL ABDOMINAL HYSTERECTOMY    . WISDOM TOOTH EXTRACTION      Family History  Problem Relation Age of Onset  . Hypertension Mother   . Stroke Mother   . Emphysema Brother   . Other Father        miner's lung  . Cancer Father        Lung  . Colon polyps Sister   . Pancreatic  cancer Sister   . Kidney disease Sister   . Atrial fibrillation Other        siblings    Social History:  Lives alone--separated. Worked in Editor, commissioning taught it . Likes to read old historical novels and walk her 84 year old cocker spaniel. She was independent PTA. She per reports that she has never smoked. She has never used smokeless tobacco. She reports that she does not drink alcohol or use drugs.    Allergies  Allergen Reactions  . Doxycycline     Rash on face and neck  . Flagyl [Metronidazole] Nausea And Vomiting  . Moxifloxacin Other (See Comments)     Weakness/fatigue  . Pantoprazole Sodium Rash  . Penicillins Rash    Has patient had a PCN reaction causing immediate rash, facial/tongue/throat swelling, SOB or lightheadedness with hypotension:unsure Has patient had a PCN reaction causing severe rash involving mucus membranes or skin necrosis:unsure Has patient had a PCN reaction that required hospitalization:No Has patient had a PCN reaction occurring within the last 10 years:Yes Cannot recall exact reaction due to time lapse If all of the above answers are "NO", then may proceed with Cephalosporin use.     Medications Prior to Admission  Medication Sig Dispense Refill  . Biotin 5000 MCG TABS Take 5,000 mcg by mouth daily.    . bisoprolol (ZEBETA) 10 MG tablet Take 1 tablet by mouth daily. Please monitor your blood pressure and heart rate daily for a couple of weeks to make sure they remain stable. (Patient taking differently: Take 10 mg by mouth daily. Please monitor your blood pressure and heart rate daily for a couple of weeks to make sure they remain stable.) 90 tablet 1  . cefUROXime (CEFTIN) 250 MG tablet Take 250 mg by mouth 2 (two) times daily.    . Cholecalciferol (VITAMIN D-3) 125 MCG (5000 UT) TABS Take 5,000 Units by mouth daily.     [name] Kitchen dextromethorphan-guaiFENesin (MUCINEX DM) 30-600 MG 12hr tablet Take 1-2 tablets by mouth 2 (two) times daily as needed (for  allegies/respiratory issues.).    [name] Kitchen diltiazem (CARDIZEM CD) 240 MG 24 hr capsule Take 1 capsule (240 mg total) by mouth daily. (Patient taking differently: Take 240 mg by mouth at bedtime. ) 90 capsule 1  . diphenhydrAMINE (BENADRYL) 25 MG tablet Take 25 mg by mouth every 6 (six) hours as needed for allergies.    [name] Kitchen FLUoxetine (PROZAC) 10 MG tablet Take 10 mg by mouth 2 (two) times daily as needed (anxiety).     . fluticasone (FLONASE) 50 MCG/ACT nasal spray Place 1 spray into both nostrils 2 (two) times daily as needed (for nasal congestion).    . folic acid (FOLVITE) A999333 MCG tablet Take 1 mg by mouth 3 (three) times a week. MWF    . furosemide (LASIX) 20 MG tablet Take 20 mg by mouth daily  as needed for fluid.     [name] Kitchen levothyroxine (SYNTHROID, LEVOTHROID) 50 MCG tablet Take 1 tablet (50 mcg total) by mouth daily. (Patient taking differently: Take 50 mcg by mouth daily before breakfast. ) 90 tablet 3  . olmesartan (BENICAR) 40 MG tablet Take 40 mg by mouth daily.    [name] Kitchen omeprazole (PRILOSEC) 20 MG capsule TAKE 1 CAPSULE(20 MG) BY MOUTH DAILY (Patient taking differently: Take 20 mg by mouth daily. ) 90 capsule 4  . pravastatin (PRAVACHOL) 40 MG tablet Take 40 mg by mouth at bedtime.     . SYMBICORT 80-4.5 MCG/ACT inhaler INHALE 2 PUFFS INTO THE LUNGS TWICE DAILY (Patient taking differently: Inhale 2 puffs into the lungs in the morning and at bedtime. ) 10.2 g 3  . vitamin B-12 (CYANOCOBALAMIN) 1000 MCG tablet Take 1,000 mcg by mouth 3 (three) times a week. MWF    . warfarin (COUMADIN) 2.5 MG tablet Take 1/2-1 tablet by mouth daily as directed by [facility] (Patient taking differently: Take 1.25-2.5 mg by mouth See admin instructions. Take 1 tablet (2.5 mg)  Tuesday Thursday & Saturday Sunday Take 0.5 tablet (1.25 mg) Monday. Wednesday & Friday) 90 tablet 1  . zinc gluconate 50 MG tablet Take 50 mg by mouth daily.      Drug Regimen Review  Drug regimen was reviewed and remains appropriate with no  significant issues identified  Home: Home Living Family/patient expects to be discharged to:: Private residence Living Arrangements: Alone Available Help at Discharge: Family Type of Home: House Home Access: Stairs to enter [employer] of Steps: 2 front, 3 in back Entrance Stairs-Rails: None Home Layout: One level Bathroom Shower/Tub: Multimedia programmer: Handicapped height Home Equipment: Grab bars - tub/shower, Shower seat - built in   Functional History: Prior Function Level of Independence: Independent Comments: still drives  Functional Status:  Mobility: Bed Mobility Overal bed mobility: Needs Assistance Bed Mobility: Supine to Sit Supine to sit: Mod assist, +2 for physical assistance, +2 for safety/equipment Sit to supine: Mod assist, Max assist, +2 for physical assistance, +2 for safety/equipment General bed mobility comments: Increased time with cues for sequence and physical assist to manage R LE and control trunk Transfers Overall transfer level: Needs assistance Equipment used: Rolling walker (2 wheeled) Transfers: Sit to/from Stand Sit to Stand: Min assist, Mod assist, +2 physical assistance, +2 safety/equipment, From elevated surface General transfer comment: max cues for sequencing, proper hand placement and body mechanics Ambulation/Gait Ambulation/Gait assistance: Min assist, Mod assist, +2 safety/equipment Gait Distance (Feet): 24 Feet Assistive device: Rolling walker (2 wheeled) Gait Pattern/deviations: Step-to pattern, Decreased step length - right, Decreased step length - left, Shuffle, Trunk flexed General Gait Details: Step-be-step cues for sequence, foot placement and UE WB.   Gait velocity: decr    ADL: ADL Overall ADL's : Needs assistance/impaired Grooming: Set up, Sitting Upper Body Bathing: Sitting, Set up Lower Body Bathing: Maximal assistance, Sitting/lateral leans, Sit to/from stand Upper Body Dressing : Set up,  Sitting Lower Body Dressing: Maximal assistance, Total assistance, Sitting/lateral leans, Sit to/from stand Lower Body Dressing Details (indicate cue type and reason): total A don socks due to precautions, initiated education regarding LB AE  Toilet Transfer: Minimal assistance, Moderate assistance, +2 for physical assistance, +2 for safety/equipment, BSC, RW, Ambulation, Cueing for safety, Cueing for sequencing, Adhering to hip precautions Toilet Transfer Details (indicate cue type and reason): simulated with functional mobility and reclincer transfer, requires step by step cues for sequencing movement of walker,  LEs to take steps Toileting- Clothing Manipulation and Hygiene: Maximal assistance, Total assistance, Sitting/lateral lean, Sit to/from stand Functional mobility during ADLs: Moderate assistance, Minimal assistance, Cueing for safety, Cueing for sequencing, +2 for safety/equipment, +2 for physical assistance, Rolling walker General ADL Comments: Patient requires increased assistance with self care due to decreased strength, balance, safety awareness, knowledge of precautions  Cognition: Cognition Overall Cognitive Status: Within Functional Limits for tasks assessed Orientation Level: Oriented to person, Oriented to place, Oriented to situation, Disoriented to time Cognition Arousal/Alertness: Awake/alert Behavior During Therapy: WFL for tasks assessed/performed Overall Cognitive Status: Within Functional Limits for tasks assessed General Comments: some difficulty with sequencing however suspect due to post op day 1/medications   Blood pressure 129/63, pulse (!) 57, temperature 97.7 F (36.5 C), temperature source Oral, resp. rate 18, height 5\' 6"  (1.676 m), weight 86.7 kg, SpO2 98 %. Physical Exam  Nursing note and vitals reviewed. Constitutional: She is oriented to person, place, and time. She appears well-developed and well-nourished.  On 2 L oxygen per [place]  HENT:  Head:  Normocephalic and atraumatic.  Eyes: EOM are normal. Right eye exhibits chemosis. Right eye exhibits no discharge. Left eye exhibits chemosis. Left eye exhibits no discharge.  Neck: No tracheal deviation present. No thyromegaly present.  Cardiovascular: An irregularly irregular rhythm present.  Respiratory: Effort normal. No stridor. No respiratory distress.  + [place]  GI: Soft. She exhibits no distension.  Musculoskeletal:     Comments: Right hip with edema and tenderness   Neurological: She is alert and oriented to person, place, and time.  Motor: Bilateral upper extremities: 5/5 proximal distal Left lower extremity: 3-3+/5 hip flexion, knee extension, 4/5 ankle dorsiflexion Right lower extremity: Hip flexion, knee extension 2+/5, ankle dorsiflexion 4/5 Sensation intact light touch  Skin:  Right hip with dressing C/D/I  Psychiatric: She has a normal mood and affect. Her behavior is normal. Thought content normal.    Results for orders placed or performed during the hospital encounter of 10/27/19 (from the past 48 hour(s))  CBC     Status: Abnormal   Collection Time: 10/29/19  3:09 AM  Result Value Ref Range   WBC 9.7 4.0 - 10.5 K/uL   RBC 3.56 (L) 3.87 - 5.11 MIL/uL   Hemoglobin 10.7 (L) 12.0 - 15.0 g/dL   HCT 35.7 (L) 36.0 - 46.0 %   MCV 100.3 (H) 80.0 - 100.0 fL   MCH 30.1 26.0 - 34.0 pg   MCHC 30.0 30.0 - 36.0 g/dL   RDW 13.7 11.5 - 15.5 %   Platelets 171 150 - 400 K/uL   nRBC 0.0 0.0 - 0.2 %    Comment: Performed at [facility], [place] [address] [place], [place] [id number]  Basic metabolic panel     Status: Abnormal   Collection Time: 10/29/19  3:09 AM  Result Value Ref Range   Sodium 127 (L) 135 - 145 mmol/L   Potassium 4.4 3.5 - 5.1 mmol/L   Chloride 97 (L) 98 - 111 mmol/L   CO2 22 22 - 32 mmol/L   Glucose, Bld 146 (H) 70 - 99 mg/dL    Comment: Glucose reference range applies only to samples taken after fasting for at least 8 hours.   BUN 20 8 - 23  mg/dL   Creatinine, Ser 0.69 0.44 - 1.00 mg/dL   Calcium 8.3 (L) 8.9 - 10.3 mg/dL   GFR calc non Af Amer >60 >60 mL/min   GFR calc Af Amer >  60 >60 mL/min   Anion gap 8 5 - 15    Comment: Performed at [facility], [place] [address] [place], [place] [address]  Protime-INR     Status: Abnormal   Collection Time: 10/29/19  3:09 AM  Result Value Ref Range   Prothrombin Time 18.9 (H) 11.4 - 15.2 seconds   INR 1.6 (H) 0.8 - 1.2    Comment: (NOTE) INR goal varies based on device and disease states. Performed at [facility], [place] [address] [place], [place] [address]   Protime-INR     Status: Abnormal   Collection Time: 10/30/19  3:13 AM  Result Value Ref Range   Prothrombin Time 24.9 (H) 11.4 - 15.2 seconds   INR 2.3 (H) 0.8 - 1.2    Comment: (NOTE) INR goal varies based on device and disease states. Performed at [facility] [place] [address], [place], [place] [address]   CBC     Status: Abnormal   Collection Time: 10/30/19  3:13 AM  Result Value Ref Range   WBC 14.5 (H) 4.0 - 10.5 K/uL   RBC 3.53 (L) 3.87 - 5.11 MIL/uL   Hemoglobin 10.7 (L) 12.0 - 15.0 g/dL   HCT 34.7 (L) 36.0 - 46.0 %   MCV 98.3 80.0 - 100.0 fL   MCH 30.3 26.0 - 34.0 pg   MCHC 30.8 30.0 - 36.0 g/dL   RDW 13.6 11.5 - 15.5 %   Platelets 178 150 - 400 K/uL   nRBC 0.0 0.0 - 0.2 %    Comment: Performed at [facility], [place] [address], [place], [place] [id number]  Basic metabolic panel     Status: Abnormal   Collection Time: 10/30/19  3:13 AM  Result Value Ref Range   Sodium 128 (L) 135 - 145 mmol/L   Potassium 4.1 3.5 - 5.1 mmol/L   Chloride 97 (L) 98 - 111 mmol/L   CO2 21 (L) 22 - 32 mmol/L   Glucose, Bld 125 (H) 70 - 99 mg/dL    Comment: Glucose reference range applies only to samples taken after fasting for at least 8 hours.   BUN 34 (H) 8 - 23 mg/dL   Creatinine, Ser 1.00 0.44 - 1.00 mg/dL   Calcium 8.4 (L) 8.9 - 10.3 mg/dL   GFR  calc non Af Amer 52 (L) >60 mL/min   GFR calc Af Amer 60 (L) >60 mL/min   Anion gap 10 5 - 15    Comment: Performed at [facility], [place] [address] [place],  [address]   Pelvis Portable  Result Date: 10/28/2019 CLINICAL DATA:  Right hip replacement EXAM: PORTABLE PELVIS 1-2 VIEWS COMPARISON:  None. FINDINGS: Interval right total hip arthroplasty. Expected appearance on this single view. IMPRESSION: Post right total hip arthroplasty. Electronically Signed   By: Macy Mis M.D.   On: 10/28/2019 19:20   DG CHEST PORT 1 VIEW  Result Date: 10/29/2019 CLINICAL DATA:  Postoperative right hip arthroplasty, CHF, hypoxia EXAM: PORTABLE CHEST 1 VIEW COMPARISON:  10/27/2019 FINDINGS: Interval improvement in bilateral interstitial and heterogeneous airspace opacity. Probable left basilar atelectasis or consolidation. Cardiomegaly. Chronic fracture deformity of the proximal left humerus. IMPRESSION: 1. Interval improvement in bilateral interstitial and heterogeneous airspace opacity, most consistent with improved edema. 2.  Probable left basilar atelectasis or consolidation. 3.  Cardiomegaly. Electronically Signed   By: Eddie Candle M.D.   On: 10/29/2019 09:33       Medical  Problem List and Plan: 1.  Decreased endurance, dizziness with activity, problems with sequencing and poor safety awareness secondary to right hip fracture s/p right hip hemiarthroplasty.  -patient may not shower  -ELOS/Goals: 12-16 days/supervision/min a  Admit to CIR  2.  Antithrombotics: -DVT/anticoagulation:  Pharmaceutical: Coumadin  -antiplatelet therapy: N/A 3. Pain Management: Continue tramadol qid with tylenol prn. Will add ice prn for local measures.   Monitor with increased exertion 4. Mood: LCSW to follow for evaluation and support.   -antipsychotic agents: N/A 5. Neuropsych: This patient is capable of making decisions on her own behalf. 6. Skin/Wound Care: Monitor wound daily for  healing.  7. Fluids/Electrolytes/Nutrition: Monitor I/O.  CMP ordered. 8. Hyponatremia: Acute on chronic --baseline 130. Will monitor with strict I/O and monitor lytes daily for now. Added FR if  improvement not seen.   CMP ordered. 9.CAF: Monitor HR tid-on Cardizem, Zebata and coumadin.   Monitor with increased activity. 10. Chronic diastolic CHF: I/Os.  Continue irbesartan, Zebeta and pravachol.   Daily weights. 11. Bronchiectasis/COPD: On Dulera bid.   Wean to home supplemental oxygen nightly only. 13. Reactive leucocytosis:   WBC has fluctuated 15.6-->9.7-->14.5, CBC ordered. 14. Pre-renal azotemia: Question due to diuresis.   CMP ordered. 15. GERD/cricopharyngeal achalasia: Resume PPI. Needs to be upright for meals.  16. Constipation: Will add Senna to colace as had not had BM since admission.   Adjust bowel meds as necessary  Bary Leriche, PA-C 10/30/2019  I have personally performed a face to face diagnostic evaluation, including, but not limited to relevant history and physical exam findings, of this patient and developed relevant assessment and plan.  Additionally, I have reviewed and concur with the physician assistant's documentation above.  Delice Lesch, MD, ABPMR

## 2019-10-30 NOTE — Evaluation (Signed)
Physical Therapy Assessment and Plan  Patient Details  Name: Stephanie Cortez MRN: [id number] Date of Birth: 1934/04/08  PT Diagnosis: Abnormality of gait, Difficulty walking, Muscle weakness and Pain in R hip Rehab Potential: Good ELOS: 10-12 days   Today's Date: 10/31/2019 PT Individual Time: 2831-5176 AND 1330-1430 PT Individual Time Calculation (min): 55 min AND 60 min  Problem List:  Patient Active Problem List   Diagnosis Date Noted  . Subcapital fracture of femur ([place]) 10/30/2019  . Post-operative state   . Prerenal azotemia   . Leukocytosis   . Closed right hip fracture, initial encounter ([place]) 10/27/2019  . Hoarseness 02/19/2018  . Sensorineural hearing loss (SNHL) of both ears 02/19/2018  . Tinnitus, bilateral 02/19/2018  . Cough 09/06/2017  . Cough variant asthma 08/19/2017  . Acute bronchitis 02/20/2016  . Pulmonary air trapping 01/04/2016  . Encounter for preoperative pulmonary examination 01/04/2016  . NSIP (nonspecific interstitial pneumonia) ([place]) 09/26/2015  . Chronic sinusitis 09/26/2015  . ILD (interstitial lung disease) ([place]) 07/12/2014  . Upper airway cough syndrome 09/23/2013  . Encounter for therapeutic drug monitoring 08/11/2013  . Congestive heart failure ([place]) 07/08/2013  . Coronary artery calcification seen on CAT scan 03/15/2013  . Nodule of left lung 03/15/2013  . Orthostatic lightheadedness 02/09/2013  . Situational mixed anxiety and depressive disorder   . Hyponatremia 04/03/2012  . Diverticulitis 01/01/2012  . GERD with stricture 01/01/2012  . Hypothyroid 11/23/2010  . Edema 10/04/2010  . Bronchiectasis ([place])   . HYPERTENSION, BENIGN 10/22/2008  . A-fib [facility])     Past Medical History:  Past Medical History:  Diagnosis Date  . Anemia    - Hgb 9.7gm% on 07/13/2008 in [place] -  Hgg 129gm% wiht normal irone levsl and ferritin 10/27/2008 in [place] Recurrent otitis/sinusitis  . Anxiety    chronic BZ prn  . Atrial fibrillation (HCC)     chronic anticoag  . Bronchiectasis    >PFT 07/13/2008 in [place] 1.9L/76%, FVC 2.45L/74, Ratio 79, TLC 121%, DLCO 64%  AE BRonchiectasis - Dec 2010.New Rx:  outpatient - Feb 2011 - Rx outpatient  . CHF (congestive heart failure) ([place])   . COPD (chronic obstructive pulmonary disease) (HCC)    bronchiectasis  . Cricopharyngeal achalasia   . Depression   . Diverticulosis   . Dyslipidemia   . Eczema   . GERD with stricture   . Glaucoma   . H. pylori infection   . Hypertension   . Hyponatremia    chronic, s/p endo eval 06/2012  . Hypothyroid   . IBS (irritable bowel syndrome)   . Lumbar disc disease   . Neuropathy of both feet   . Segmental colitis ([place])   . Vertigo   . Wears glasses    Past Surgical History:  Past Surgical History:  Procedure Laterality Date  . BREAST SURGERY     br bx  . CARDIAC CATHETERIZATION  07/01/2013  . CATARACT EXTRACTION     both  . COLONOSCOPY    . ESOPHAGOSCOPY W/ BOTOX INJECTION  12/11/2011   Procedure: ESOPHAGOSCOPY WITH BOTOX INJECTION;  Surgeon: Rozetta Nunnery, MD;  Location: [place];  Service: ENT;  Laterality: N/A;  esophogoscopy with dilation, botox injection  . FOOT SURGERY  03/14/2011   gastroc slide-rt  . HIP ARTHROPLASTY Right 10/28/2019   Procedure: ARTHROPLASTY BIPOLAR HIP (HEMIARTHROPLASTY);  Surgeon: Marybelle Killings, MD;  Location: [facility];  Service: Orthopedics;  Laterality: Right;  . TOTAL ABDOMINAL HYSTERECTOMY    .  WISDOM TOOTH EXTRACTION      Assessment & Plan Clinical Impression: Patient is a 86 yo Female with history of permanent A. Fib on coumadin, chronic diastolic heart failure, HTN, probably ILD, hypothyroidism, hyperlipidemia, and depression. She was admitted to [facility] on 10/27/19 after patient tripped over her dog, resulting in a subcapital fracture of the right proximal femur. Orthopedics was consulted and recommended an arthoplasty bipolar hip repair for her right femoral neck fracture. Surgery was  performed by Dr. Lorin Mercy on 10/28/19. Post op, pt with oxygen desaturation, requiring up to 5L [place]. Throughout her stay, pt required management of her acute on chronic hypoxic respiratory failure, concern for atelectasis and mild volume overload. Pt has been having hyponatremia, which is suspected to be secondary to half-normal saline; IV fluids discontinued and lasix given. Pt has been evaluated by therapies with recommendation by PT for CIR. Patient transferred to CIR on 10/30/2019 .   Patient currently requires mod with mobility secondary to muscle weakness and muscle joint tightness, decreased cardiorespiratoy endurance and decreased oxygen support and decreased sitting balance, decreased standing balance, decreased postural control, decreased balance strategies and difficulty maintaining precautions.  Prior to hospitalization, patient was independent  with mobility and lived with Family(son and daughter-in-law) in a House home.  Home access is 2 front, 3 in backStairs to enter.  Patient will benefit from skilled PT intervention to maximize safe functional mobility, minimize fall risk and decrease caregiver burden for planned discharge home with 24 hour supervision.  Anticipate patient will benefit from follow up [place] at discharge.  PT - End of Session Activity Tolerance: Tolerates < 10 min activity, no significant change in vital signs Endurance Deficit: Yes Endurance Deficit Description: decreased PT Assessment Rehab Potential (ACUTE/IP ONLY): Good PT Barriers to Discharge: Other (comments);Inaccessible home environment PT Barriers to Discharge Comments: posterior hip precautions PT Patient demonstrates impairments in the following area(s): Balance;Edema;Endurance;Pain;Safety PT Transfers Functional Problem(s): Bed Mobility;Floor;Bed to Chair;Car;Furniture PT Locomotion Functional Problem(s): Ambulation;Wheelchair Mobility;Stairs PT Plan PT Intensity: Minimum of 1-2 x/day ,45 to 90 minutes PT  Frequency: 5 out of 7 days PT Duration Estimated Length of Stay: 10-12 days PT Treatment/Interventions: Ambulation/gait training;Discharge planning;DME/adaptive equipment instruction;Functional mobility training;Pain management;Psychosocial support;Splinting/orthotics;Therapeutic Activities;UE/LE Strength taining/ROM;Wheelchair propulsion/positioning;UE/LE Coordination activities;Therapeutic Exercise;Stair training;Skin care/wound management;Patient/family education;Disease management/prevention;Neuromuscular re-education;Community reintegration;Balance/vestibular training PT Transfers Anticipated Outcome(s): supervision PT Locomotion Anticipated Outcome(s): supervision short distance gait w/ LRAD PT Recommendation Follow Up Recommendations: Home health PT Patient destination: Home Equipment Recommended: To be determined  Skilled Therapeutic Intervention  Session 1:  Pt in w/c and agreeable to therapy, denies pain at rest and is requesting to toilet. Mod assist stand pivot w/o AD to toilet, heavy use of grab bars. Pt continent of void. She was able to pull pants down prior to sitting, but needed total assist to pull pants up over hips after toileting 2/2 body habitus and pants being too small. Pt self-propelled w/c w/ BUEs x50' towards therapy gym. Pt very slow and it was energetically taxing for her. Total assist remainder of way. Ambulated 20' w/ RW, close w/c follow for safety but did not need it. Min-mod assist for upright balance (pt w/ posterior lean at times 2/2 pain w/ RLE WB) and for RW management. Pain decreases once she returns to sitting. Pt very hyper-aware of her gait pattern, she's very concerned w/ performing tasks the "correct way". Encouraged her to not over think her movements other than maintaining posterior hip precautions. Performed car transfer w/ mod assist, verbal cues for technique.  Returned to room total assist via w/c. Stand pivot to EOB w/ min assist w/ RW, min assist bed  mobility. Ended session in supine, all needs in reach. Discussed home set-up w/ pt and possibility of her son-in-law building at least 1 rail at the stairs for increased independence and safety w/ home access. Pt very open to this and plans to discuss it with him. Instructed pt in results of PT evaluation as detailed below, PT POC, rehab potential, rehab goals, and discharge recommendations.   Session 2:  Pt in supine and agreeable to therapy, reports pain in R hip 8-9/10. Made RN aware who provided pain medication. Pt's daughter and son-in-law present, briefly discussed benefits of installing rail at home for increased safety w/ home access. Both in agreement and plan to install at least 1 prior to d/c. Supine>sit w/ min assist and mod assist sit>stand to RW, stand pivot to w/c w/ min assist. Total assist w/c transport to therapy gym. Performed RLE strengthening/ROM exercises as listed below. Emphasized pain-tolerable ROM and moving slowly for gentle ROM to minimize exacerbation in pain. Pt self-propelled w/c back towards room w/ BUEs to work on endurance training, x50;. Total assist remainder of distance. Stand pivot to EOB w/ mod assist and min assist sit>supine. Ended session in supine, all needs in reach. Pain down to 2-3/10 at end of session.   RLE strengthening/ROM: -knee march 4x5 -LAQ 3x10 -heel slide 1x10  -isometric hip abduction 2x10  -isometric hip adduction 2x10 -heel raise 2x10   PT Evaluation Precautions/Restrictions Precautions Precautions: Posterior Hip Precaution Booklet Issued: Yes (comment) Precaution Comments: Unable to recall.  All THP reviewed Restrictions Weight Bearing Restrictions: No RLE Weight Bearing: Weight bearing as tolerated General   Vital SignsOxygen Therapy SpO2: 94 % O2 Device: Nasal Cannula O2 Flow Rate (L/min): 2 L/min Pain Pain Assessment Pain Scale: 0-10 Pain Score: 0-No pain Home Living/Prior Functioning Home Living Available Help at  Discharge: Family Type of Home: House Home Access: Stairs to enter [employer] of Steps: 2 front, 3 in back Entrance Stairs-Rails: None Home Layout: One level Bathroom Shower/Tub: Multimedia programmer: Handicapped height  Lives With: Family(son and daughter-in-law) Prior Function Level of Independence: Independent with basic ADLs;Independent with homemaking with ambulation;Independent with transfers;Independent with gait  Able to Take Stairs?: Yes Driving: Yes Comments: Pt active, still drives and runs her own errands. Vision/Perception  Perception Perception: Within Functional Limits Praxis Praxis: Intact  Cognition Overall Cognitive Status: Within Functional Limits for tasks assessed Arousal/Alertness: Awake/alert Orientation Level: Oriented X4 Memory: Appears intact Immediate Memory Recall: Sock;Blue;Bed Memory Recall Sock: Without Cue Memory Recall Blue: With Cue Memory Recall Bed: Not able to recall Awareness: Appears intact Safety/Judgment: Impaired Comments: Decreased awareness of her posterior hip precautions Sensation Sensation Light Touch: Impaired by gross assessment(neuropathy in B feet at baseline, describes as tingling) Coordination Gross Motor Movements are Fluid and Coordinated: Yes Fine Motor Movements are Fluid and Coordinated: Yes Motor  Motor Motor: Within Functional Limits  Mobility Bed Mobility Bed Mobility: Rolling Right;Rolling Left;Supine to Sit;Sit to Supine Rolling Right: Minimal Assistance - Patient > 75% Rolling Left: Minimal Assistance - Patient > 75% Supine to Sit: Minimal Assistance - Patient > 75% Sit to Supine: Minimal Assistance - Patient > 75% Transfers Transfers: Sit to Stand;Stand to Sit;Stand Pivot Transfers Sit to Stand: Moderate Assistance - Patient 50-74% Stand to Sit: Moderate Assistance - Patient 50-74% Stand Pivot Transfers: Moderate Assistance - Patient 50 - 74% Stand Pivot Transfer Details: Manual  facilitation for weight shifting;Verbal cues for sequencing;Verbal cues for technique;Verbal cues for precautions/safety Transfer (Assistive device): None Locomotion  Gait Ambulation: Yes Gait Assistance: Minimal Assistance - Patient > 75% Gait Distance (Feet): 20 Feet Assistive device: Rolling walker Gait Assistance Details: Verbal cues for precautions/safety;Verbal cues for safe use of DME/AE;Manual facilitation for weight shifting;Verbal cues for gait pattern;Verbal cues for technique Gait Gait: Yes Gait Pattern: Impaired Gait Pattern: Antalgic;Shuffle;Trunk flexed Gait velocity: decreased Stairs / Additional Locomotion Stairs: No Architect: Yes Wheelchair Assistance: Chartered loss adjuster: Both upper extremities Wheelchair Parts Management: Needs assistance Distance: 50'  Trunk/Postural Assessment  Cervical Assessment Cervical Assessment: Within Functional Limits Thoracic Assessment Thoracic Assessment: Within Functional Limits Lumbar Assessment Lumbar Assessment: Exceptions to WFL(posterior pelvic tilt) Postural Control Postural Control: Deficits on evaluation(posterior lean bias when she first stands up, appears related to RLE pain in WB)  Balance Balance Balance Assessed: Yes Static Sitting Balance Static Sitting - Level of Assistance: 5: Stand by assistance Dynamic Sitting Balance Dynamic Sitting - Level of Assistance: 5: Stand by assistance Static Standing Balance Static Standing - Level of Assistance: 4: Min assist Dynamic Standing Balance Dynamic Standing - Level of Assistance: 3: Mod assist Extremity Assessment  RLE Assessment RLE Assessment: Exceptions to [facility] Passive Range of Motion (PROM) Comments: Increased hip stiffness globally 2/2 pain, pt also w/ posterior hip precautions General Strength Comments: Knee and ankle WFL. Isometric testing of hip is painful but strong contraction of all hip  musculature noted. LLE Assessment LLE Assessment: Within Functional Limits    Refer to Care Plan for Long Term Goals  Recommendations for other services: None   Discharge Criteria: Patient will be discharged from PT if patient refuses treatment 3 consecutive times without medical reason, if treatment goals not met, if there is a change in medical status, if patient makes no progress towards goals or if patient is discharged from hospital.  The above assessment, treatment plan, treatment alternatives and goals were discussed and mutually agreed upon: by patient  Stephanie Cortez K Frimy Uffelman 10/31/2019, 11:05 AM

## 2019-10-30 NOTE — Progress Notes (Signed)
OT recommendation changed to CIR based on speaking with PT and reviewing chart  Kari Baars, [place] [phone] [phone]

## 2019-10-31 ENCOUNTER — Inpatient Hospital Stay (HOSPITAL_COMMUNITY): Payer: Medicare Other | Admitting: Physical Therapy

## 2019-10-31 ENCOUNTER — Inpatient Hospital Stay (HOSPITAL_COMMUNITY): Payer: Medicare Other

## 2019-10-31 DIAGNOSIS — Z9981 Dependence on supplemental oxygen: Secondary | ICD-10-CM

## 2019-10-31 DIAGNOSIS — D62 Acute posthemorrhagic anemia: Secondary | ICD-10-CM

## 2019-10-31 DIAGNOSIS — I5032 Chronic diastolic (congestive) heart failure: Secondary | ICD-10-CM

## 2019-10-31 DIAGNOSIS — E8809 Other disorders of plasma-protein metabolism, not elsewhere classified: Secondary | ICD-10-CM

## 2019-10-31 DIAGNOSIS — E871 Hypo-osmolality and hyponatremia: Secondary | ICD-10-CM

## 2019-10-31 DIAGNOSIS — R0989 Other specified symptoms and signs involving the circulatory and respiratory systems: Secondary | ICD-10-CM | POA: Insufficient documentation

## 2019-10-31 DIAGNOSIS — G8918 Other acute postprocedural pain: Secondary | ICD-10-CM

## 2019-10-31 DIAGNOSIS — R7401 Elevation of levels of liver transaminase levels: Secondary | ICD-10-CM

## 2019-10-31 DIAGNOSIS — S72011S Unspecified intracapsular fracture of right femur, sequela: Secondary | ICD-10-CM

## 2019-10-31 DIAGNOSIS — R7989 Other specified abnormal findings of blood chemistry: Secondary | ICD-10-CM

## 2019-10-31 DIAGNOSIS — K5903 Drug induced constipation: Secondary | ICD-10-CM

## 2019-10-31 DIAGNOSIS — E46 Unspecified protein-calorie malnutrition: Secondary | ICD-10-CM

## 2019-10-31 LAB — COMPREHENSIVE METABOLIC PANEL
ALT: 49 U/L — ABNORMAL HIGH (ref 0–44)
AST: 60 U/L — ABNORMAL HIGH (ref 15–41)
Albumin: 2.3 g/dL — ABNORMAL LOW (ref 3.5–5.0)
Alkaline Phosphatase: 97 U/L (ref 38–126)
Anion gap: 7 (ref 5–15)
BUN: 24 mg/dL — ABNORMAL HIGH (ref 8–23)
CO2: 26 mmol/L (ref 22–32)
Calcium: 8.6 mg/dL — ABNORMAL LOW (ref 8.9–10.3)
Chloride: 99 mmol/L (ref 98–111)
Creatinine, Ser: 0.8 mg/dL (ref 0.44–1.00)
GFR calc Af Amer: >60 mL/min (ref >60)
GFR calc non Af Amer: >60 mL/min (ref >60)
Glucose, Bld: 98 mg/dL (ref 70–99)
Potassium: 4.3 mmol/L (ref 3.5–5.1)
Sodium: 132 mmol/L — ABNORMAL LOW (ref 135–145)
Total Bilirubin: 0.9 mg/dL (ref 0.3–1.2)
Total Protein: 5 g/dL — ABNORMAL LOW (ref 6.5–8.1)

## 2019-10-31 LAB — CBC WITH DIFFERENTIAL/PLATELET
Abs Immature Granulocytes: 0.05 10*3/uL (ref 0.00–0.07)
Basophils Absolute: 0 10*3/uL (ref 0.0–0.1)
Basophils Relative: 0 %
Eosinophils Absolute: 0.4 10*3/uL (ref 0.0–0.5)
Eosinophils Relative: 5 %
HCT: 33.3 % — ABNORMAL LOW (ref 36.0–46.0)
Hemoglobin: 10.5 g/dL — ABNORMAL LOW (ref 12.0–15.0)
Immature Granulocytes: 1 %
Lymphocytes Relative: 9 %
Lymphs Abs: 0.8 10*3/uL (ref 0.7–4.0)
MCH: 29.8 pg (ref 26.0–34.0)
MCHC: 31.5 g/dL (ref 30.0–36.0)
MCV: 94.6 fL (ref 80.0–100.0)
Monocytes Absolute: 1.1 10*3/uL — ABNORMAL HIGH (ref 0.1–1.0)
Monocytes Relative: 12 %
Neutro Abs: 6.9 10*3/uL (ref 1.7–7.7)
Neutrophils Relative %: 73 %
Platelets: 222 10*3/uL (ref 150–400)
RBC: 3.52 MIL/uL — ABNORMAL LOW (ref 3.87–5.11)
RDW: 13.5 % (ref 11.5–15.5)
WBC: 9.4 10*3/uL (ref 4.0–10.5)
nRBC: 0 % (ref 0.0–0.2)

## 2019-10-31 LAB — PROTIME-INR
INR: 2.4 — ABNORMAL HIGH (ref 0.8–1.2)
Prothrombin Time: 25.7 seconds — ABNORMAL HIGH (ref 11.4–15.2)

## 2019-10-31 MED ORDER — WARFARIN SODIUM 1 MG PO TABS
1.0000 mg | ORAL_TABLET | Freq: Once | ORAL | Status: AC
  Administered 2019-10-31: 1 mg via ORAL
  Filled 2019-10-31: qty 1

## 2019-10-31 NOTE — Progress Notes (Signed)
ANTICOAGULATION CONSULT NOTE - Follow Up Consult  Pharmacy Consult for warfarin Indication: atrial fibrillation, s/p R THA  Allergies  Allergen Reactions  . Doxycycline     Rash on face and neck  . Flagyl [Metronidazole] Nausea And Vomiting  . Moxifloxacin Other (See Comments)     Weakness/fatigue  . Pantoprazole Sodium Rash  . Penicillins Rash    Has patient had a PCN reaction causing immediate rash, facial/tongue/throat swelling, SOB or lightheadedness with hypotension:unsure Has patient had a PCN reaction causing severe rash involving mucus membranes or skin necrosis:unsure Has patient had a PCN reaction that required hospitalization:No Has patient had a PCN reaction occurring within the last 10 years:Yes Cannot recall exact reaction due to time lapse If all of the above answers are "NO", then may proceed with Cephalosporin use.     Patient Measurements: Height: 5\' 6"  (167.6 cm) Weight: 84.8 kg (186 lb 14.4 oz) IBW/kg (Calculated) : 59.3  Vital Signs: Temp: 97.7 F (36.5 C) (05/01 0509) Temp Source: Oral (05/01 0509) BP: 151/48 (05/01 0509) Pulse Rate: 71 (05/01 0509)  Labs: Recent Labs    10/29/19 0309 10/29/19 0309 10/30/19 0313 10/31/19 0544  HGB 10.7*   < > 10.7* 10.5*  HCT 35.7*  --  34.7* 33.3*  PLT 171  --  178 222  LABPROT 18.9*  --  24.9* 25.7*  INR 1.6*  --  2.3* 2.4*  CREATININE 0.69  --  1.00 0.80   < > = values in this interval not displayed.    Estimated Creatinine Clearance: 57.4 mL/min (by C-G formula based on SCr of 0.8 mg/dL).  Assessment: 86 year old female with past medical history significant for HF, HTN, probable ILD. Patient on warfarin PTA for hx of atrial fibrillation. Pt admitted for fall d/t hip fracture, underwent right hip arthroplasty on 4/28. Pharmacy consulted to resume warfarin dosing post-op.  Home dose: Warfarin 1.25 mg on MWF and 2.5 mg all other days    10/31/2019 - CBC: Hgb (10.5) slightly low, but stable post-op. Plt  WNL & stable - INR 2.4 remains therapeutic today - No documented bleeding issues - No significant DDI (on levothyroxine) - Eating >75% of meals. Albumin 2.3 today, down from admission.  Goal of Therapy:  INR 2-3  Plan:  - Warfarin 1 mg by mouth once today at 1600 (reduced dose from home regimen due to recent increase in INR) - Daily INR, CBC - Monitor for signs and symptoms of bleed     Sakoya Win L. Devin Going, [place] PGY1 Pharmacy Resident [phone] 10/31/19      8:36 AM  Please check AMION for all [place] phone numbers After 10:00 PM, call the [place] [phone]

## 2019-10-31 NOTE — Plan of Care (Signed)
  Problem: Consults Goal: RH GENERAL PATIENT EDUCATION Description: See Patient Education module for education specifics. Outcome: Progressing   Problem: RH BLADDER ELIMINATION Goal: RH STG MANAGE BLADDER WITH ASSISTANCE Description: STG Manage Bladder With Min Assistance Outcome: Progressing   Problem: RH SKIN INTEGRITY Goal: RH STG SKIN FREE OF INFECTION/BREAKDOWN Description: No new breakdown with min assist  Outcome: Progressing Goal: RH STG ABLE TO PERFORM INCISION/WOUND CARE W/ASSISTANCE Description: STG Able To Perform Incision/Wound Care With [employer]. Outcome: Progressing   Problem: RH SAFETY Goal: RH STG ADHERE TO SAFETY PRECAUTIONS W/ASSISTANCE/DEVICE Description: STG Adhere to Safety Precautions With Min Assistance/Device. Outcome: Progressing   Problem: RH PAIN MANAGEMENT Goal: RH STG PAIN MANAGED AT OR BELOW PT'S PAIN GOAL Description: < 3 out of 10.  Outcome: Progressing

## 2019-10-31 NOTE — Evaluation (Signed)
Occupational Therapy Assessment and Plan  Patient Details  Name: Stephanie Cortez MRN: [id number] Date of Birth: Dec 03, 1933  OT Diagnosis: abnormal posture, apraxia, cognitive deficits, muscle weakness (generalized) and pain in joint Rehab Potential:   ELOS: 12-16 days   Today's Date: 10/31/2019 OT Individual Time: 0800-0900 OT Individual Time Calculation (min): 60 min     Problem List:  Patient Active Problem List   Diagnosis Date Noted  . Subcapital fracture of femur ([place]) 10/30/2019  . Post-operative state   . Prerenal azotemia   . Leukocytosis   . Closed right hip fracture, initial encounter ([place]) 10/27/2019  . Hoarseness 02/19/2018  . Sensorineural hearing loss (SNHL) of both ears 02/19/2018  . Tinnitus, bilateral 02/19/2018  . Cough 09/06/2017  . Cough variant asthma 08/19/2017  . Acute bronchitis 02/20/2016  . Pulmonary air trapping 01/04/2016  . Encounter for preoperative pulmonary examination 01/04/2016  . NSIP (nonspecific interstitial pneumonia) ([place]) 09/26/2015  . Chronic sinusitis 09/26/2015  . ILD (interstitial lung disease) ([place]) 07/12/2014  . Upper airway cough syndrome 09/23/2013  . Encounter for therapeutic drug monitoring 08/11/2013  . Congestive heart failure ([place]) 07/08/2013  . Coronary artery calcification seen on CAT scan 03/15/2013  . Nodule of left lung 03/15/2013  . Orthostatic lightheadedness 02/09/2013  . Situational mixed anxiety and depressive disorder   . Hyponatremia 04/03/2012  . Diverticulitis 01/01/2012  . GERD with stricture 01/01/2012  . Hypothyroid 11/23/2010  . Edema 10/04/2010  . Bronchiectasis ([place])   . HYPERTENSION, BENIGN 10/22/2008  . A-fib [facility])     Past Medical History:  Past Medical History:  Diagnosis Date  . Anemia    - Hgb 9.7gm% on 07/13/2008 in [place] -  Hgg 129gm% wiht normal irone levsl and ferritin 10/27/2008 in [place] Recurrent otitis/sinusitis  . Anxiety    chronic BZ prn  . Atrial fibrillation (H[facility])    chronic  anticoag  . Bronchiectasis    >PFT 07/13/2008 in [place] 1.9L/76%, FVC 2.45L/74, Ratio 79, TLC 121%, DLCO 64%  AE BRonchiectasis - Dec 2010.New Rx:  outpatient - Feb 2011 - Rx outpatient  . CHF (congestive heart failure) ([place])   . COPD (chronic obstructive pulmonary disease) (H[facility])    bronchiectasis  . Cricopharyngeal achalasia   . Depression   . Diverticulosis   . Dyslipidemia   . Eczema   . GERD with stricture   . Glaucoma   . H. pylori infection   . Hypertension   . Hyponatremia    chronic, s/p endo eval 06/2012  . Hypothyroid   . IBS (irritable bowel syndrome)   . Lumbar disc disease   . Neuropathy of both feet   . Segmental colitis ([place])   . Vertigo   . Wears glasses    Past Surgical History:  Past Surgical History:  Procedure Laterality Date  . BREAST SURGERY     br bx  . CARDIAC CATHETERIZATION  07/01/2013  . CATARACT EXTRACTION     both  . COLONOSCOPY    . ESOPHAGOSCOPY W/ BOTOX INJECTION  12/11/2011   Procedure: ESOPHAGOSCOPY WITH BOTOX INJECTION;  Surgeon: Rozetta Nunnery, MD;  Location: [facility];  Service: ENT;  Laterality: N/A;  esophogoscopy with dilation, botox injection  . FOOT SURGERY  03/14/2011   gastroc slide-rt  . HIP ARTHROPLASTY Right 10/28/2019   Procedure: ARTHROPLASTY BIPOLAR HIP (HEMIARTHROPLASTY);  Surgeon: Marybelle Killings, MD;  Location: [facility];  Service: Orthopedics;  Laterality: Right;  . TOTAL ABDOMINAL HYSTERECTOMY    .  WISDOM TOOTH EXTRACTION      Assessment & Plan Clinical Impression:  Stephanie Cortez is an 86 year old female with history of CHF-Coumadin, chronic diastolic HF, HTN, depression, neuropathy, COPD/bronchiectasis--2L oxygen dependent nightly; who tripped and fell over her dog night prior to admission on 10/27/2019 with progressive right hip pain.  History taken from chart review and patient.  CT head unremarkable for acute intracranial process.  Imaging revealed right femur fracture.  She was found to have  subcapital fracture proximal right femur and underwent right hip hemi by Dr. Lorin Mercy on 10/28/2019. Post op to be WBAT and Coumadin resumed.  Hospital course complicated by hypotension, acute on chronic hyponatremia, as well as increase in oxygen needs (upto 5 L) felt to be due to fluid overload. IVF were d/ced and she received IV Lasix X 1 with improvement in respiratory status. Per nursing reports, she has had issues with sundowning also. Therapy evaluations completed revealing patients with decreased endurance, dizziness with activity, problems with sequencing and poor safety awareness.  CIR recommended due to functional decline.  Please see preadmission assessment from earlier today   Patient currently requires max with basic self-care skills secondary to muscle weakness, decreased cardiorespiratoy endurance, decreased coordination and decreased motor planning, decreased safety awareness and decreased memory and decreased sitting balance, decreased standing balance, decreased postural control, decreased balance strategies and difficulty maintaining precautions.  Prior to hospitalization, patient could complete BADL/IADL with modified independent .  Patient will benefit from skilled intervention to decrease level of assist with basic self-care skills and increase independence with basic self-care skills prior to discharge home with care partner.  Anticipate patient will require 24 hour supervision and follow up home health.  OT - End of Session Activity Tolerance: Tolerates 30+ min activity with multiple rests Endurance Deficit: Yes Endurance Deficit Description: decreased OT Assessment Rehab Potential (ACUTE ONLY): Fair OT Barriers to Discharge: Inaccessible home environment;Home environment access/layout OT Patient demonstrates impairments in the following area(s): Balance;Endurance;Motor;Pain;Perception;Safety;Skin Integrity;Sensory;Edema OT Basic ADL's Functional Problem(s):  Grooming;Bathing;Dressing;Toileting OT Transfers Functional Problem(s): Toilet;Tub/Shower OT Additional Impairment(s): None OT Plan OT Intensity: Minimum of 1-2 x/day, 45 to 90 minutes OT Frequency: 5 out of 7 days OT Duration/Estimated Length of Stay: 12-16 days OT Treatment/Interventions: Balance/vestibular training;Discharge planning;Pain management;Self Care/advanced ADL retraining;Therapeutic Activities;UE/LE Coordination activities;Visual/perceptual remediation/compensation;Therapeutic Exercise;Skin care/wound managment;Patient/family education;Functional mobility training;Disease mangement/prevention;Community reintegration;DME/adaptive equipment instruction;Neuromuscular re-education;Psychosocial support;Splinting/orthotics;UE/LE Strength taining/ROM;Wheelchair propulsion/positioning OT Self Feeding Anticipated Outcome(s): S OT Basic Self-Care Anticipated Outcome(s): S OT Toileting Anticipated Outcome(s): S OT Bathroom Transfers Anticipated Outcome(s): S OT Recommendation Patient destination: Home Follow Up Recommendations: Home health OT Equipment Recommended: Tub/shower seat;3 in 1 bedside comode   Skilled Therapeutic Intervention 1;1. Pt received in bed agreeable too OT after edu re OT role/purpose, CIR, ELOS and POC. Pt very verbose thoruhgout session requiring VC for redireection. Pt unable to recall any hip precautions and reeducated. Pt completes supine>sitting EOB with A for RLE to EOB. Pt stand pivot transfer from WOB with VC for hand palcement on RW/bed with MOD A with posterior lean. Pt bathes at sink with A to wash B lower legs, backa nd buttocks in sit to stand at sink with MOD A to power up into standing at sink. Pt requires total A for LB dressing and CGA for spinning bra around to front after clipping. Pt grooms with set up seated at sink for energy conservation. Attempted seated tasks with no O@ but dropped to 89% therefore remainder of session on 2L O2. Exited session  with pt  seated in w/c exit alarm on and call light in reach.  OT Evaluation Precautions/Restrictions  Precautions Precautions: Posterior Hip Precaution Booklet Issued: Yes (comment) Precaution Comments: Pt able to recall 1/3 THP without cues.  All THP reviewed Restrictions Weight Bearing Restrictions: No RLE Weight Bearing: Weight bearing as tolerated General Chart Reviewed: Yes Family/Caregiver Present: No Vital Signs Therapy Vitals Temp: 97.7 F (36.5 C) Temp Source: Oral Pulse Rate: 71 Resp: 19 BP: (!) 151/48 Patient Position (if appropriate): Lying Oxygen Therapy SpO2: 94 % O2 Device: Nasal Cannula Pain Pain Assessment Pain Scale: 0-10 Pain Score: 0-No pain Home Living/Prior Functioning Home Living Family/patient expects to be discharged to:: Private residence Living Arrangements: Alone Available Help at Discharge: Family Type of Home: House Home Access: Stairs to enter [employer] of Steps: 2 front, 3 in back Entrance Stairs-Rails: None Home Layout: One level Bathroom Shower/Tub: Multimedia programmer: Handicapped height Prior Function Level of Independence: Independent with basic ADLs, Independent with homemaking with ambulation Comments: still drives ADL   Vision Baseline Vision/History: Wears glasses Wears Glasses: Reading only Patient Visual Report: No change from baseline Vision Assessment?: No apparent visual deficits Perception  Perception: Within Functional Limits Praxis Praxis: Intact Cognition Overall Cognitive Status: Within Functional Limits for tasks assessed Arousal/Alertness: Awake/alert Orientation Level: Person;Place;Situation Person: Oriented Place: Oriented Situation: Oriented Year: 2021 Month: May Day of Week: Correct Memory: Appears intact Immediate Memory Recall: Sock;Blue;Bed Memory Recall Sock: Without Cue Memory Recall Blue: With Cue Memory Recall Bed: Not able to recall Awareness: Appears  intact Safety/Judgment: Impaired Sensation Sensation Light Touch: Impaired by gross assessment(neuropathy in B feet; increased in R d/t swelling from surgery) Coordination Gross Motor Movements are Fluid and Coordinated: Yes Fine Motor Movements are Fluid and Coordinated: Yes Motor  Motor Motor: Within Functional Limits Mobility  Transfers Sit to Stand: Moderate Assistance - Patient 50-74% Stand to Sit: Moderate Assistance - Patient 50-74%  Trunk/Postural Assessment  Cervical Assessment Cervical Assessment: Within Functional Limits Thoracic Assessment Thoracic Assessment: Within Functional Limits Lumbar Assessment Lumbar Assessment: Exceptions to [facility] Postural Control Postural Control: Deficits on evaluation(delayed)  Balance Balance Balance Assessed: Yes Static Sitting Balance Static Sitting - Level of Assistance: 5: Stand by assistance Dynamic Sitting Balance Dynamic Sitting - Level of Assistance: 5: Stand by assistance Static Standing Balance Static Standing - Level of Assistance: 4: Min assist Dynamic Standing Balance Dynamic Standing - Level of Assistance: 3: Mod assist Extremity/Trunk Assessment RUE Assessment RUE Assessment: Within Functional Limits General Strength Comments: generalized weakness LUE Assessment LUE Assessment: Within Functional Limits General Strength Comments: generalized weakness     Refer to Care Plan for Long Term Goals  Recommendations for other services: Therapeutic Recreation  Pet therapy and Stress management   Discharge Criteria: Patient will be discharged from OT if patient refuses treatment 3 consecutive times without medical reason, if treatment goals not met, if there is a change in medical status, if patient makes no progress towards goals or if patient is discharged from hospital.  The above assessment, treatment plan, treatment alternatives and goals were discussed and mutually agreed upon: by patient  Tonny Branch 10/31/2019, 8:34 AM

## 2019-10-31 NOTE — Progress Notes (Signed)
[facility] PHYSICAL MEDICINE & REHABILITATION PROGRESS NOTE  Subjective/Complaints: Patient seen sitting up in bed this morning.  She states she slept well overnight.  She states she is ready begin therapies.  Discussed dressing to right hip with nursing.  Patient also noted to be constipated.  ROS: + Constipation. Denies CP, SOB, N/V/D   Objective: Vital Signs: Blood pressure (!) 108/51, pulse (!) 48, temperature 97.7 F (36.5 C), temperature source Oral, resp. rate 17, height 5\' 6"  (1.676 m), weight 84.8 kg, SpO2 99 %. No results found. Recent Labs    10/30/19 0313 10/31/19 0544  WBC 14.5* 9.4  HGB 10.7* 10.5*  HCT 34.7* 33.3*  PLT 178 222   Recent Labs    10/30/19 0313 10/31/19 0544  NA 128* 132*  K 4.1 4.3  CL 97* 99  CO2 21* 26  GLUCOSE 125* 98  BUN 34* 24*  CREATININE 1.00 0.80  CALCIUM 8.4* 8.6*    Physical Exam: BP (!) 108/51 (BP Location: Left Arm)   Pulse (!) 48   Temp 97.7 F (36.5 C) (Oral)   Resp 17   Ht 5\' 6"  (1.676 m)   Wt 84.8 kg   LMP  (LMP Unknown)   SpO2 99%   BMI 30.17 kg/m  Constitutional: No distress . Vital signs reviewed. HENT: Normocephalic.  Atraumatic. Eyes: EOMI. No discharge. Cardiovascular: No JVD. Respiratory: Normal effort.  No stridor.  + [place]. GI: Non-distended. Skin: Right hip with serosanguineous drainage Psych: Normal mood.  Normal behavior. Musc: Right hip with edema and tenderness Neurological: Alert Motor: Motor: Bilateral upper extremities: 5/5 proximal distal Left lower extremity: 4/5 hip flexion, knee extension, 4+/5 ankle dorsiflexion Right lower extremity: Hip flexion, knee extension 2+/5, ankle dorsiflexion 4/5, unchanged  Assessment/Plan: 1. Functional deficits secondary to right hip fracture status post right hip hemiarthroplasty which require 3+ hours per day of interdisciplinary therapy in a comprehensive inpatient rehab setting.  Physiatrist is providing close team supervision and 24 hour management of  active medical problems listed below.  Physiatrist and rehab team continue to assess barriers to discharge/monitor patient progress toward functional and medical goals  Care Tool:  Bathing    Body parts bathed by patient: Right arm, Left arm, Chest, Abdomen, Front perineal area, Right upper leg, Left upper leg   Body parts bathed by helper: Buttocks, Right lower leg, Left lower leg     Bathing assist Assist Level: Moderate Assistance - Patient 50 - 74%     Upper Body Dressing/Undressing Upper body dressing   What is the patient wearing?: Button up shirt, Pull over shirt, Bra(pull button up around back, spin bra)    Upper body assist Assist Level: Minimal Assistance - Patient > 75%    Lower Body Dressing/Undressing Lower body dressing      What is the patient wearing?: Incontinence brief, Pants     Lower body assist Assist for lower body dressing: Total Assistance - Patient < 25%     Toileting Toileting    Toileting assist Assist for toileting: Maximal Assistance - Patient 25 - 49%     Transfers Chair/bed transfer  Transfers assist  Chair/bed transfer activity did not occur: Safety/medical concerns  Chair/bed transfer assist level: Moderate Assistance - Patient 50 - 74%     Locomotion Ambulation   Ambulation assist   Ambulation activity did not occur: Safety/medical concerns          Walk 10 feet activity   Assist  Walk 10 feet activity did not  occur: Safety/medical concerns        Walk 50 feet activity   Assist Walk 50 feet with 2 turns activity did not occur: Safety/medical concerns         Walk 150 feet activity   Assist Walk 150 feet activity did not occur: Safety/medical concerns         Walk 10 feet on uneven surface  activity   Assist Walk 10 feet on uneven surfaces activity did not occur: Safety/medical concerns         Wheelchair     Assist Will patient use wheelchair at discharge?: (TBD) Type of Wheelchair:  Manual    Wheelchair assist level: Supervision/Verbal cueing Max wheelchair distance: 78'    Wheelchair 50 feet with 2 turns activity    Assist        Assist Level: Supervision/Verbal cueing   Wheelchair 150 feet activity     Assist     Assist Level: Maximal Assistance - Patient 25 - 49%      Medical Problem List and Plan: 1.  Decreased endurance, dizziness with activity, problems with sequencing and poor safety awareness secondary to right hip fracture s/p right hip hemiarthroplasty.  Begin CIR evaluations 2.  Antithrombotics: -DVT/anticoagulation:  Pharmaceutical: Coumadin             -antiplatelet therapy: N/A 3. Pain Management: Continue tramadol qid with tylenol prn.  Added ice prn for local measures.              Monitor with increased exertion 4. Mood: LCSW to follow for evaluation and support.              -antipsychotic agents: N/A 5. Neuropsych: This patient is capable of making decisions on her own behalf. 6. Skin/Wound Care: Monitor wound daily for healing.  7. Fluids/Electrolytes/Nutrition: Monitor I/O.   8. Hyponatremia: Acute on chronic --baseline 130. Will monitor with strict I/Os  Added FR if  improvement not seen.              Sodium 132 on 5/1  Continue to monitor 9. CAF: Monitor HR tid-on Cardizem, Zebata and coumadin.              Monitor with increased activity. 10. Chronic diastolic CHF: I/Os.  Continue irbesartan, Zebeta and pravachol.  Filed Weights   10/30/19 1604 10/31/19 0509  Weight: 89.7 kg 84.8 kg   11. Bronchiectasis/COPD: On Dulera bid             Wean to supplemental oxygen nightly only as tolerated 13. Reactive leucocytosis: Resolved 14. Pre-renal azotemia: Question due to diuresis.              BUN elevated on 5/1, but improving  Will consider the same fluid restriction if lack of improvement 15. GERD/cricopharyngeal achalasia: Resumed PPI. Needs to be upright for meals.  16. Constipation: Added senna to colace as had  not had BM since admission.              Plan for sorbitol this afternoon if no bowel movement this a.m. 17.  Labile blood pressure  Monitor with increased activity 18.  Moderate hypoalbuminemia  Supplement initiated on 5/1 19.  Transaminitis  LFTs elevated on 5/1  Continue to monitor 20.  Acute blood loss anemia  Hemoglobin 10.5 on 5/1  Continue to monitor  LOS: 1 days A FACE TO FACE EVALUATION WAS PERFORMED  Gianne Shugars [name] 10/31/2019, 2:06 PM

## 2019-11-01 DIAGNOSIS — Z7901 Long term (current) use of anticoagulants: Secondary | ICD-10-CM

## 2019-11-01 LAB — CBC
HCT: 33.1 % — ABNORMAL LOW (ref 36.0–46.0)
Hemoglobin: 10.6 g/dL — ABNORMAL LOW (ref 12.0–15.0)
MCH: 31.2 pg (ref 26.0–34.0)
MCHC: 32 g/dL (ref 30.0–36.0)
MCV: 97.4 fL (ref 80.0–100.0)
Platelets: 225 10*3/uL (ref 150–400)
RBC: 3.4 MIL/uL — ABNORMAL LOW (ref 3.87–5.11)
RDW: 13.7 % (ref 11.5–15.5)
WBC: 10.4 10*3/uL (ref 4.0–10.5)
nRBC: 0 % (ref 0.0–0.2)

## 2019-11-01 LAB — BASIC METABOLIC PANEL
Anion gap: 6 (ref 5–15)
BUN: 23 mg/dL (ref 8–23)
CO2: 26 mmol/L (ref 22–32)
Calcium: 8.8 mg/dL — ABNORMAL LOW (ref 8.9–10.3)
Chloride: 100 mmol/L (ref 98–111)
Creatinine, Ser: 0.83 mg/dL (ref 0.44–1.00)
GFR calc Af Amer: >60 mL/min (ref >60)
GFR calc non Af Amer: >60 mL/min (ref >60)
Glucose, Bld: 92 mg/dL (ref 70–99)
Potassium: 4.5 mmol/L (ref 3.5–5.1)
Sodium: 132 mmol/L — ABNORMAL LOW (ref 135–145)

## 2019-11-01 LAB — PROTIME-INR
INR: 2.1 — ABNORMAL HIGH (ref 0.8–1.2)
Prothrombin Time: 23 seconds — ABNORMAL HIGH (ref 11.4–15.2)

## 2019-11-01 MED ORDER — POLYETHYLENE GLYCOL 3350 17 G PO PACK
17.0000 g | PACK | Freq: Two times a day (BID) | ORAL | Status: DC
  Administered 2019-11-01 – 2019-11-11 (×14): 17 g via ORAL
  Filled 2019-11-01 (×24): qty 1

## 2019-11-01 MED ORDER — WARFARIN SODIUM 2 MG PO TABS
2.0000 mg | ORAL_TABLET | Freq: Once | ORAL | Status: AC
  Administered 2019-11-01: 2 mg via ORAL
  Filled 2019-11-01: qty 1

## 2019-11-01 NOTE — Plan of Care (Signed)
  Problem: Consults Goal: RH GENERAL PATIENT EDUCATION Description: See Patient Education module for education specifics. Outcome: Progressing   Problem: RH BLADDER ELIMINATION Goal: RH STG MANAGE BLADDER WITH ASSISTANCE Description: STG Manage Bladder With Min Assistance Outcome: Progressing   Problem: RH SKIN INTEGRITY Goal: RH STG SKIN FREE OF INFECTION/BREAKDOWN Description: No new breakdown with min assist  Outcome: Progressing Goal: RH STG ABLE TO PERFORM INCISION/WOUND CARE W/ASSISTANCE Description: STG Able To Perform Incision/Wound Care With [employer]. Outcome: Progressing   Problem: RH SAFETY Goal: RH STG ADHERE TO SAFETY PRECAUTIONS W/ASSISTANCE/DEVICE Description: STG Adhere to Safety Precautions With Min Assistance/Device. Outcome: Progressing   Problem: RH PAIN MANAGEMENT Goal: RH STG PAIN MANAGED AT OR BELOW PT'S PAIN GOAL Description: < 3 out of 10.  Outcome: Progressing   Problem: RH BOWEL ELIMINATION Goal: RH STG MANAGE BOWEL WITH ASSISTANCE Description: STG Manage Bowel with min Assistance. Outcome: Progressing Goal: RH STG MANAGE BOWEL W/MEDICATION W/ASSISTANCE Description: STG Manage Bowel with Medication with min Assistance. Outcome: Progressing

## 2019-11-01 NOTE — Progress Notes (Signed)
[facility] PHYSICAL MEDICINE & REHABILITATION PROGRESS NOTE  Subjective/Complaints: Patient seen sitting up in bed this AM.  She states she slept well overnight.  She states she had a good first day of therapies yesterday.   ROS: Denies CP, SOB, N/V/D   Objective: Vital Signs: Blood pressure (!) 114/53, pulse (!) 58, temperature 98.3 F (36.8 C), temperature source Oral, resp. rate 20, height 5\' 6"  (1.676 m), weight 85.2 kg, SpO2 97 %. No results found. Recent Labs    10/31/19 0544 11/01/19 0533  WBC 9.4 10.4  HGB 10.5* 10.6*  HCT 33.3* 33.1*  PLT 222 225   Recent Labs    10/31/19 0544 11/01/19 0533  NA 132* 132*  K 4.3 4.5  CL 99 100  CO2 26 26  GLUCOSE 98 92  BUN 24* 23  CREATININE 0.80 0.83  CALCIUM 8.6* 8.8*    Physical Exam: BP (!) 114/53 (BP Location: Left Arm)   Pulse (!) 58   Temp 98.3 F (36.8 C) (Oral)   Resp 20   Ht 5\' 6"  (1.676 m)   Wt 85.2 kg   LMP  (LMP Unknown)   SpO2 97%   BMI 30.32 kg/m  Constitutional: No distress . Vital signs reviewed. HENT: Normocephalic.  Atraumatic. Eyes: EOMI. No discharge. Cardiovascular: No JVD. Respiratory: Normal effort.  No stridor. +[place]. GI: Non-distended. Skin: Right hip with dressing C/D/I. Psych: Normal mood.  Normal behavior. Musc: Right hip with edema and tenderness, improving Neurological: Alert Motor: Motor: Left lower extremity: 4/5 hip flexion, knee extension, 4+/5 ankle dorsiflexion, improving Right lower extremity: Hip flexion, knee extension 3-/5, ankle dorsiflexion 4/5  Assessment/Plan: 1. Functional deficits secondary to right hip fracture status post right hip hemiarthroplasty which require 3+ hours per day of interdisciplinary therapy in a comprehensive inpatient rehab setting.  Physiatrist is providing close team supervision and 24 hour management of active medical problems listed below.  Physiatrist and rehab team continue to assess barriers to discharge/monitor patient progress toward  functional and medical goals  Care Tool:  Bathing    Body parts bathed by patient: Right arm, Left arm, Chest, Abdomen, Front perineal area, Right upper leg, Left upper leg   Body parts bathed by helper: Buttocks, Right lower leg, Left lower leg     Bathing assist Assist Level: Moderate Assistance - Patient 50 - 74%     Upper Body Dressing/Undressing Upper body dressing   What is the patient wearing?: Button up shirt    Upper body assist Assist Level: Minimal Assistance - Patient > 75%    Lower Body Dressing/Undressing Lower body dressing      What is the patient wearing?: Incontinence brief     Lower body assist Assist for lower body dressing: Total Assistance - Patient < 25%     Toileting Toileting    Toileting assist Assist for toileting: Maximal Assistance - Patient 25 - 49%     Transfers Chair/bed transfer  Transfers assist  Chair/bed transfer activity did not occur: Safety/medical concerns  Chair/bed transfer assist level: Moderate Assistance - Patient 50 - 74%     Locomotion Ambulation   Ambulation assist   Ambulation activity did not occur: Safety/medical concerns          Walk 10 feet activity   Assist  Walk 10 feet activity did not occur: Safety/medical concerns        Walk 50 feet activity   Assist Walk 50 feet with 2 turns activity did not occur: Safety/medical concerns  Walk 150 feet activity   Assist Walk 150 feet activity did not occur: Safety/medical concerns         Walk 10 feet on uneven surface  activity   Assist Walk 10 feet on uneven surfaces activity did not occur: Safety/medical concerns         Wheelchair     Assist Will patient use wheelchair at discharge?: (TBD) Type of Wheelchair: Manual    Wheelchair assist level: Supervision/Verbal cueing Max wheelchair distance: 76'    Wheelchair 50 feet with 2 turns activity    Assist        Assist Level: Supervision/Verbal cueing    Wheelchair 150 feet activity     Assist     Assist Level: Maximal Assistance - Patient 25 - 49%      Medical Problem List and Plan: 1.  Decreased endurance, dizziness with activity, problems with sequencing and poor safety awareness secondary to right hip fracture s/p right hip hemiarthroplasty.  Continue CIR 2.  Antithrombotics: -DVT/anticoagulation:  Pharmaceutical: Coumadin  INR therapeutic on 5/2             -antiplatelet therapy: N/A 3. Pain Management: Continue tramadol qid with tylenol prn.  Added ice prn for local measures.   Controlled with meds on 5/2             Monitor with increased exertion 4. Mood: LCSW to follow for evaluation and support.              -antipsychotic agents: N/A 5. Neuropsych: This patient is capable of making decisions on her own behalf. 6. Skin/Wound Care: Monitor wound daily for healing.  7. Fluids/Electrolytes/Nutrition: Monitor I/O.   8. Hyponatremia: Acute on chronic --baseline 130. Will monitor with strict I/Os  Added FR if  improvement not seen.              Sodium 132 on 5/2  Continue to monitor 9. CAF: Monitor HR tid-on Cardizem, Zebata and coumadin.   Rate controlled on 5/2             Monitor with increased activity. 10. Chronic diastolic CHF: I/Os.  Continue irbesartan, Zebeta and pravachol.  Filed Weights   10/30/19 1604 10/31/19 0509 11/01/19 0557  Weight: 89.7 kg 84.8 kg 85.2 kg   ?Stable on 5/2 11. Bronchiectasis/COPD: On Dulera bid             Wean to supplemental oxygen nightly only as tolerated 13. Reactive leucocytosis: Resolved 14. Pre-renal azotemia: Resolved 15. GERD/cricopharyngeal achalasia: Resumed PPI. Needs to be upright for meals.  16. Drug induced constipation: Added senna to colace as had not had BM since admission.              Bowel meds increased on 5/2 17.  Labile blood pressure  Controlled on 5/2 18.  Moderate hypoalbuminemia  Supplement initiated on 5/1 19.  Transaminitis  LFTs elevated on  5/1  Continue to monitor 20.  Acute blood loss anemia  Hemoglobin 10.6 on 5/2  Continue to monitor  LOS: 2 days A FACE TO FACE EVALUATION WAS PERFORMED  Stephanie Cortez 11/01/2019, 6:57 PM

## 2019-11-01 NOTE — Progress Notes (Signed)
ANTICOAGULATION CONSULT NOTE - Follow Up Consult  Pharmacy Consult for warfarin Indication: atrial fibrillation, s/p R THA  Allergies  Allergen Reactions  . Doxycycline     Rash on face and neck  . Flagyl [Metronidazole] Nausea And Vomiting  . Moxifloxacin Other (See Comments)     Weakness/fatigue  . Pantoprazole Sodium Rash  . Penicillins Rash    Has patient had a PCN reaction causing immediate rash, facial/tongue/throat swelling, SOB or lightheadedness with hypotension:unsure Has patient had a PCN reaction causing severe rash involving mucus membranes or skin necrosis:unsure Has patient had a PCN reaction that required hospitalization:No Has patient had a PCN reaction occurring within the last 10 years:Yes Cannot recall exact reaction due to time lapse If all of the above answers are "NO", then may proceed with Cephalosporin use.     Patient Measurements: Height: 5\' 6"  (167.6 cm) Weight: 85.2 kg (187 lb 13.3 oz) IBW/kg (Calculated) : 59.3  Vital Signs: Temp: 98 F (36.7 C) (05/02 0557) Temp Source: Oral (05/02 0557) BP: 123/55 (05/02 0557) Pulse Rate: 55 (05/02 0557)  Labs: Recent Labs    10/30/19 0313 10/30/19 0313 10/31/19 0544 11/01/19 0533  HGB 10.7*   < > 10.5* 10.6*  HCT 34.7*  --  33.3* 33.1*  PLT 178  --  222 225  LABPROT 24.9*  --  25.7* 23.0*  INR 2.3*  --  2.4* 2.1*  CREATININE 1.00  --  0.80 0.83   < > = values in this interval not displayed.    Estimated Creatinine Clearance: 55.5 mL/min (by C-G formula based on SCr of 0.83 mg/dL).  Assessment: 86 year old female with past medical history significant for HF, HTN, probable ILD. Patient on warfarin PTA for hx of atrial fibrillation. Pt admitted for fall d/t hip fracture, underwent right hip arthroplasty on 4/28. Pharmacy consulted to resume warfarin dosing post-op.  Home dose: Warfarin 1.25 mg on MWF and 2.5 mg all other days    11/01/2019 - CBC: Hgb (10.6) slightly low, but stable post-op. Plt  WNL & stable - INR remains therapeutic today at 2.1 - No documented bleeding issues - No significant DDI (on levothyroxine) - Eating ~75% of meals. Albumin 2.3, down from admission.  Goal of Therapy:  INR 2-3  Plan:  - Warfarin 2 mg by mouth once today at 1600 (reduced dose from home regimen due to recent increase in INR) - Daily INR, CBC - Monitor for signs and symptoms of bleed     Khadejah Son L. Devin Going, [facility] PGY1 Pharmacy Resident [phone] 11/01/19      7:37 AM  Please check AMION for all [facility] phone numbers After 10:00 PM, call the [facility] [phone]

## 2019-11-02 ENCOUNTER — Inpatient Hospital Stay (HOSPITAL_COMMUNITY): Payer: Medicare Other

## 2019-11-02 ENCOUNTER — Inpatient Hospital Stay (HOSPITAL_COMMUNITY): Payer: Medicare Other | Admitting: Speech Pathology

## 2019-11-02 ENCOUNTER — Inpatient Hospital Stay (HOSPITAL_COMMUNITY): Payer: Medicare Other | Admitting: Occupational Therapy

## 2019-11-02 DIAGNOSIS — I482 Chronic atrial fibrillation, unspecified: Secondary | ICD-10-CM

## 2019-11-02 LAB — BASIC METABOLIC PANEL
Anion gap: 8 (ref 5–15)
BUN: 16 mg/dL (ref 8–23)
CO2: 27 mmol/L (ref 22–32)
Calcium: 9.1 mg/dL (ref 8.9–10.3)
Chloride: 98 mmol/L (ref 98–111)
Creatinine, Ser: 0.77 mg/dL (ref 0.44–1.00)
GFR calc Af Amer: >60 mL/min (ref >60)
GFR calc non Af Amer: >60 mL/min (ref >60)
Glucose, Bld: 93 mg/dL (ref 70–99)
Potassium: 4.7 mmol/L (ref 3.5–5.1)
Sodium: 133 mmol/L — ABNORMAL LOW (ref 135–145)

## 2019-11-02 LAB — PROTIME-INR
INR: 1.7 — ABNORMAL HIGH (ref 0.8–1.2)
Prothrombin Time: 19.5 seconds — ABNORMAL HIGH (ref 11.4–15.2)

## 2019-11-02 LAB — CBC
HCT: 36.6 % (ref 36.0–46.0)
Hemoglobin: 11.7 g/dL — ABNORMAL LOW (ref 12.0–15.0)
MCH: 30.5 pg (ref 26.0–34.0)
MCHC: 32 g/dL (ref 30.0–36.0)
MCV: 95.3 fL (ref 80.0–100.0)
Platelets: 268 10*3/uL (ref 150–400)
RBC: 3.84 MIL/uL — ABNORMAL LOW (ref 3.87–5.11)
RDW: 13.6 % (ref 11.5–15.5)
WBC: 11.9 10*3/uL — ABNORMAL HIGH (ref 4.0–10.5)
nRBC: 0 % (ref 0.0–0.2)

## 2019-11-02 MED ORDER — BISOPROLOL FUMARATE 5 MG PO TABS
5.0000 mg | ORAL_TABLET | Freq: Every day | ORAL | Status: DC
  Administered 2019-11-03 – 2019-11-15 (×13): 5 mg via ORAL
  Filled 2019-11-02 (×15): qty 1

## 2019-11-02 MED ORDER — SENNA 8.6 MG PO TABS
2.0000 | ORAL_TABLET | Freq: Two times a day (BID) | ORAL | Status: DC
  Administered 2019-11-02 – 2019-11-04 (×4): 17.2 mg via ORAL
  Filled 2019-11-02 (×6): qty 2

## 2019-11-02 MED ORDER — WARFARIN SODIUM 2.5 MG PO TABS
2.5000 mg | ORAL_TABLET | Freq: Once | ORAL | Status: AC
  Administered 2019-11-02: 2.5 mg via ORAL
  Filled 2019-11-02: qty 1

## 2019-11-02 MED ORDER — DILTIAZEM HCL ER COATED BEADS 120 MG PO CP24
120.0000 mg | ORAL_CAPSULE | Freq: Every day | ORAL | Status: DC
  Administered 2019-11-02 – 2019-11-05 (×4): 120 mg via ORAL
  Filled 2019-11-02 (×4): qty 1

## 2019-11-02 MED ORDER — FLEET ENEMA 7-19 GM/118ML RE ENEM
1.0000 | ENEMA | Freq: Once | RECTAL | Status: AC
  Filled 2019-11-02: qty 1

## 2019-11-02 MED ORDER — SORBITOL 70 % SOLN
30.0000 mL | Status: AC
  Administered 2019-11-02: 30 mL via ORAL
  Filled 2019-11-02: qty 30

## 2019-11-02 MED ORDER — FLEET ENEMA 7-19 GM/118ML RE ENEM
1.0000 | ENEMA | Freq: Once | RECTAL | Status: AC
  Administered 2019-11-02: 1 via RECTAL

## 2019-11-02 MED ORDER — TRAMADOL HCL 50 MG PO TABS
25.0000 mg | ORAL_TABLET | Freq: Four times a day (QID) | ORAL | Status: DC
  Administered 2019-11-02: 25 mg via ORAL
  Administered 2019-11-03 (×2): 50 mg via ORAL
  Filled 2019-11-02 (×3): qty 1

## 2019-11-02 NOTE — Progress Notes (Signed)
ANTICOAGULATION CONSULT NOTE - Follow Up Consult  Pharmacy Consult for warfarin Indication: atrial fibrillation, s/p R THA  Allergies  Allergen Reactions  . Doxycycline     Rash on face and neck  . Flagyl [Metronidazole] Nausea And Vomiting  . Moxifloxacin Other (See Comments)     Weakness/fatigue  . Pantoprazole Sodium Rash  . Penicillins Rash    Has patient had a PCN reaction causing immediate rash, facial/tongue/throat swelling, SOB or lightheadedness with hypotension:unsure Has patient had a PCN reaction causing severe rash involving mucus membranes or skin necrosis:unsure Has patient had a PCN reaction that required hospitalization:No Has patient had a PCN reaction occurring within the last 10 years:Yes Cannot recall exact reaction due to time lapse If all of the above answers are "NO", then may proceed with Cephalosporin use.     Patient Measurements: Height: 5\' 6"  (167.6 cm) Weight: 84.6 kg (186 lb 8 oz) IBW/kg (Calculated) : 59.3  Vital Signs: Temp: 98.5 F (36.9 C) (05/03 0402) Temp Source: Oral (05/03 0402) BP: 140/54 (05/03 0402) Pulse Rate: 61 (05/03 0402)  Labs: Recent Labs    10/31/19 0544 10/31/19 0544 11/01/19 0533 11/02/19 0604  HGB 10.5*   < > 10.6* 11.7*  HCT 33.3*  --  33.1* 36.6  PLT 222  --  225 268  LABPROT 25.7*  --  23.0* 19.5*  INR 2.4*  --  2.1* 1.7*  CREATININE 0.80  --  0.83 0.77   < > = values in this interval not displayed.    Estimated Creatinine Clearance: 57.4 mL/min (by C-G formula based on SCr of 0.77 mg/dL).  Assessment: 85 year old female with past medical history significant for HF, HTN, probable ILD. Patient on warfarin PTA for hx of atrial fibrillation. Pt admitted for fall d/t hip fracture, underwent right hip arthroplasty on 4/28. Pharmacy consulted 10/30/19 to resume warfarin dosing post-op.  Home dose: Warfarin 1.25 mg on MWF and 2.5 mg all other days    11/02/2019 - INR decreased to 1.7, subtherapeutic today.  No  bridge per Dr. Naaman Plummer.  I will adjust dose today. - CBC: Hgb (11.7) low/stable with slight improvement today,  stable post-op. Plt WNL & stable - No documented bleeding issues - No significant DDI (on levothyroxine) - Eating ~50-75% of meals. Albumin 2.3, down from admission.   Goal of Therapy:  INR 2-3  Plan:  - Warfarin 2.5 mg today at 160 - Daily INR, CBC - Monitor for signs and symptoms of bleed   Nicole Cella, RPh Clinical Pharmacist [phone] Please check AMION for all [facility] phone numbers After 10:00 PM, call the [facility] [phone] 11/02/19      12:58 PM

## 2019-11-02 NOTE — Consult Note (Signed)
Cardiology Consultation:   Patient ID: Stephanie Cortez MRN: [id number]; DOB: 1934-04-01  Admit date: 10/30/2019 Date of Consult: 11/02/2019  Primary Care Provider: [name], MD Primary Cardiologist: Stephanie Axe, MD  Primary Electrophysiologist:  None    Patient Profile:   Stephanie Cortez is a 86 y.o. female with a hx of permanent atrial fibrillation, COPD, ILD on home O2, pulmonary hypertension, chronic diastolic heart failure, hyperlipidemia, hypertension, nonobstructive CAD, and chronic anemia who is being seen today for the evaluation of syncope at the request of Stephanie Chew, PA-C .  History of Present Illness:   Stephanie Cortez tripped and fell over her dog on the night prior to admission (10/27/2019.  She subsequently developed progressive hip pain.  She was found to have a subcapital fracture of the right femur.  She had right hip hemiarthroplasty on 4/28.  Hospitalization was complicated by postoperative hypotension and acute on chronic hyponatremia.  Her home oxygen was increased to 5 L due to fluid overload.  She was diuresed with IV Lasix and discharged to [place] on 4/30.  She had she had an echo 10/2019 that revealed LVEF 60 to 65%.  Pulmonary pressures were severely elevated at 60 mmHg.  She had mild to moderate tricuspid regurgitation and right atrial pressure was 15 mmHg.  Today she was washing her hands at the sink when she had a vasovagal episode.  She had just gotten off of the bedside commode after trying to have a bowel movement.  She has struggled with constipation since sugery.  She was bradycardic and pale.  Blood pressure was low and EKG revealed atrial fibrillation with ventricular rate of 53 bpm.  She has a chronic right bundle branch block.  Her bisoprolol was held and cardiology was consulted.  She reports that her initial fall was mechanical.  She denies any chest pain or pressure.  Breathing has been worse lately.  She has been very sleepy which she  attributes to pain medication.   She follows with Stephanie Cortez for her atrial fibrillation.  She has failed multiple cardioversions and antiarrhythmics.  She last saw him on 10/12/2019 and had stable exertional dyspnea.  She was euvolemic at that time.  No changes were made.    Past Medical History:  Diagnosis Date  . Anemia    - Hgb 9.7gm% on 07/13/2008 in [place] -  Hgg 129gm% wiht normal irone levsl and ferritin 10/27/2008 in [place] Recurrent otitis/sinusitis  . Anxiety    chronic BZ prn  . Atrial fibrillation (HCC)    chronic anticoag  . Bronchiectasis    >PFT 07/13/2008 in [place] 1.9L/76%, FVC 2.45L/74, Ratio 79, TLC 121%, DLCO 64%  AE BRonchiectasis - Dec 2010.New Rx:  outpatient - Feb 2011 - Rx outpatient  . CHF (congestive heart failure) ([facility])   . COPD (chronic obstructive pulmonary disease) (HCC)    bronchiectasis  . Cricopharyngeal achalasia   . Depression   . Diverticulosis   . Dyslipidemia   . Eczema   . GERD with stricture   . Glaucoma   . H. pylori infection   . Hypertension   . Hyponatremia    chronic, s/p endo eval 06/2012  . Hypothyroid   . IBS (irritable bowel syndrome)   . Lumbar disc disease   . Neuropathy of both feet   . Segmental colitis ([facility])   . Vertigo   . Wears glasses     Past Surgical History:  Procedure Laterality Date  . BREAST  SURGERY     br bx  . CARDIAC CATHETERIZATION  07/01/2013  . CATARACT EXTRACTION     both  . COLONOSCOPY    . ESOPHAGOSCOPY W/ BOTOX INJECTION  12/11/2011   Procedure: ESOPHAGOSCOPY WITH BOTOX INJECTION;  Surgeon: Rozetta Nunnery, MD;  Location: [facility];  Service: ENT;  Laterality: N/A;  esophogoscopy with dilation, botox injection  . FOOT SURGERY  03/14/2011   gastroc slide-rt  . HIP ARTHROPLASTY Right 10/28/2019   Procedure: ARTHROPLASTY BIPOLAR HIP (HEMIARTHROPLASTY);  Surgeon: Marybelle Killings, MD;  Location: [facility];  Service: Orthopedics;  Laterality: Right;  . TOTAL ABDOMINAL HYSTERECTOMY      . WISDOM TOOTH EXTRACTION       Home Medications:  Prior to Admission medications   Medication Sig Start Date End Date Taking? Authorizing Provider  Biotin 5000 MCG TABS Take 5,000 mcg by mouth daily.    [provider]  bisoprolol (ZEBETA) 10 MG tablet Take 1 tablet by mouth daily. Please monitor your blood pressure and heart rate daily for a couple of weeks to make sure they remain stable. Patient taking differently: Take 10 mg by mouth daily. Please monitor your blood pressure and heart rate daily for a couple of weeks to make sure they remain stable. 07/20/19   Deboraha Sprang, MD  Cholecalciferol (VITAMIN D-3) 125 MCG (5000 UT) TABS Take 5,000 Units by mouth daily.     [provider]  dextromethorphan-guaiFENesin (MUCINEX DM) 30-600 MG 12hr tablet Take 1-2 tablets by mouth 2 (two) times daily as needed (for allegies/respiratory issues.).    [provider]  diltiazem (CARDIZEM CD) 240 MG 24 hr capsule Take 1 capsule (240 mg total) by mouth daily. Patient taking differently: Take 240 mg by mouth at bedtime.  02/16/19   Shirley Friar, PA-C  diphenhydrAMINE (BENADRYL) 25 MG tablet Take 25 mg by mouth every 6 (six) hours as needed for allergies.    [provider]  docusate sodium (COLACE) 100 MG capsule Take 1 capsule (100 mg total) by mouth 2 (two) times daily. 10/30/19   Harold Hedge, MD  FLUoxetine (PROZAC) 10 MG tablet Take 10 mg by mouth 2 (two) times daily as needed (anxiety).     [provider]  fluticasone (FLONASE) 50 MCG/ACT nasal spray Place 1 spray into both nostrils 2 (two) times daily as needed (for nasal congestion).    [provider]  folic acid (FOLVITE) A999333 MCG tablet Take 1 mg by mouth 3 (three) times a week. MWF    [provider]  furosemide (LASIX) 20 MG tablet Take 20 mg by mouth daily as needed for fluid.     [provider]  HYDROcodone-acetaminophen (NORCO/VICODIN) 5-325 MG tablet Take  1-2 tablets by mouth every 6 (six) hours as needed for moderate pain. 10/29/19 10/28/20  Marybelle Killings, MD  levothyroxine (SYNTHROID, LEVOTHROID) 50 MCG tablet Take 1 tablet (50 mcg total) by mouth daily. Patient taking differently: Take 50 mcg by mouth daily before breakfast.  08/12/13   Rowe Clack, MD  olmesartan (BENICAR) 40 MG tablet Take 40 mg by mouth daily.    [provider]  omeprazole (PRILOSEC) 20 MG capsule TAKE 1 CAPSULE(20 MG) BY MOUTH DAILY Patient taking differently: Take 20 mg by mouth daily.  11/07/15   Pyrtle, Lajuan Lines, MD  pravastatin (PRAVACHOL) 40 MG tablet Take 40 mg by mouth at bedtime.  10/03/16   [provider]  SYMBICORT 80-4.5 MCG/ACT  inhaler INHALE 2 PUFFS INTO THE LUNGS TWICE DAILY Patient taking differently: Inhale 2 puffs into the lungs in the morning and at bedtime.  06/17/19   Brand Males, MD  vitamin B-12 (CYANOCOBALAMIN) 1000 MCG tablet Take 1,000 mcg by mouth 3 (three) times a week. MWF    [provider]  warfarin (COUMADIN) 2.5 MG tablet Take 1/2-1 tablet by mouth daily as directed by [facility] Patient taking differently: Take 1.25-2.5 mg by mouth See admin instructions. Take 1 tablet (2.5 mg)  Tuesday Thursday & Saturday Sunday Take 0.5 tablet (1.25 mg) Monday. Wednesday & Friday 01/27/16   Deboraha Sprang, MD  zinc gluconate 50 MG tablet Take 50 mg by mouth daily.    [provider]    Inpatient Medications: Scheduled Meds: . diltiazem  240 mg Oral QHS  . levothyroxine  50 mcg Oral QAC breakfast  . mometasone-formoterol  2 puff Inhalation BID  . polyethylene glycol  17 g Oral BID  . pravastatin  40 mg Oral QHS  . senna  2 tablet Oral BID  . traMADol  25-50 mg Oral Q6H  . warfarin  2.5 mg Oral ONCE-1600  . Warfarin - Pharmacist Dosing Inpatient   Does not apply q1600   Continuous Infusions:  PRN Meds: acetaminophen, alum & mag hydroxide-simeth, bisacodyl, diphenhydrAMINE, FLUoxetine, fluticasone,  furosemide, guaiFENesin-dextromethorphan, menthol-cetylpyridinium **OR** phenol, methocarbamol, ondansetron **OR** ondansetron (ZOFRAN) IV, prochlorperazine **OR** prochlorperazine **OR** prochlorperazine, sodium phosphate, traZODone  Allergies:    Allergies  Allergen Reactions  . Doxycycline     Rash on face and neck  . Flagyl [Metronidazole] Nausea And Vomiting  . Moxifloxacin Other (See Comments)     Weakness/fatigue  . Pantoprazole Sodium Rash  . Penicillins Rash    Has patient had a PCN reaction causing immediate rash, facial/tongue/throat swelling, SOB or lightheadedness with hypotension:unsure Has patient had a PCN reaction causing severe rash involving mucus membranes or skin necrosis:unsure Has patient had a PCN reaction that required hospitalization:No Has patient had a PCN reaction occurring within the last 10 years:Yes Cannot recall exact reaction due to time lapse If all of the above answers are "NO", then may proceed with Cephalosporin use.     Social History:   Social History   Socioeconomic History  . Marital status: Married    Spouse name: Not on file  . Number of children: 2  . Years of education: Not on file  . Highest education level: Not on file  Occupational History  . Occupation: retired Product manager: [employer]  Tobacco Use  . Smoking status: Never Smoker  . Smokeless tobacco: Never Used  Substance and Sexual Activity  . Alcohol use: No  . Drug use: No  . Sexual activity: Not on file    Comment: Hysterectomy  Other Topics Concern  . Not on file  Social History Narrative   2 children 4 grandchildren   Recently moved to [place] from [place]   Retired Pharmacist, hospital   Grew up in [place]. Moved to [place]. Moved to [place] Feb 2010 due to duaghther living in [place]. Son lives in [place], [place]. Husband works as Scientist, physiological in [place].   Social Determinants of Health   Financial Resource Strain:   . Difficulty of Paying  Living Expenses:   Food Insecurity:   . Worried About Charity fundraiser in the Last Year:   . Arboriculturist in the Last Year:   Transportation Needs:   .  Lack of Transportation (Medical):   [name] Kitchen Lack of Transportation (Non-Medical):   Physical Activity:   . Days of Exercise per Week:   . Minutes of Exercise per Session:   Stress:   . Feeling of Stress :   Social Connections:   . Frequency of Communication with Friends and Family:   . Frequency of Social Gatherings with Friends and Family:   . Attends Religious Services:   . Active Member of Clubs or Organizations:   . Attends Archivist Meetings:   [name] Kitchen Marital Status:   Intimate Partner Violence:   . Fear of Current or Ex-Partner:   . Emotionally Abused:   [name] Kitchen Physically Abused:   . Sexually Abused:     Family History:    Family History  Problem Relation Age of Onset  . Hypertension Mother   . Stroke Mother   . Emphysema Brother   . Other Father        miner's lung  . Cancer Father        Lung  . Colon polyps Sister   . Pancreatic cancer Sister   . Kidney disease Sister   . Atrial fibrillation Other        siblings     ROS:  Please see the history of present illness.   All other ROS reviewed and negative.     Physical Exam/Data:   Vitals:   11/01/19 1423 11/01/19 2014 11/02/19 0402 11/02/19 1426  BP: (!) 114/53 (!) 109/47 (!) 140/54 (!) 104/46  Pulse: (!) 58 (!) 48 61 (!) 53  Resp: 20 19 18 20   Temp: 98.3 F (36.8 C) 97.9 F (36.6 C) 98.5 F (36.9 C) 98 F (36.7 C)  TempSrc: Oral Oral Oral Oral  SpO2: 97% 96% 93% 95%  Weight:   84.6 kg   Height:        Intake/Output Summary (Last 24 hours) at 11/02/2019 1647 Last data filed at 11/02/2019 1321 Gross per 24 hour  Intake 680 ml  Output 900 ml  Net -220 ml   Last 3 Weights 11/02/2019 11/01/2019 10/31/2019  Weight (lbs) 186 lb 8 oz 187 lb 13.3 oz 186 lb 14.4 oz  Weight (kg) 84.596 kg 85.2 kg 84.777 kg     Body mass index is 30.1 kg/m.  VS:  BP  (!) 104/46 (BP Location: Left Arm)   Pulse (!) 53   Temp 98 F (36.7 C) (Oral)   Resp 20   Ht 5\' 6"  (1.676 m)   Wt 84.6 kg   LMP  (LMP Unknown)   SpO2 95%   BMI 30.10 kg/m  , BMI Body mass index is 30.1 kg/m. GENERAL:  Well appearing.  Sleepy HEENT: Pupils equal round and reactive, fundi not visualized, oral mucosa unremarkable NECK:  No jugular venous distention, waveform within normal limits, carotid upstroke brisk and symmetric, no bruits LUNGS:  Diffuse expiratory squeaks HEART:  RRR.  PMI not displaced or sustained,S1 and S2 within normal limits, no S3, no S4, no clicks, no rubs, no murmurs ABD:  Flat, positive bowel sounds normal in frequency in pitch, no bruits, no rebound, no guarding, no midline pulsatile mass, no hepatomegaly, no splenomegaly EXT:  2 plus pulses throughout, trace edema, no cyanosis no clubbing SKIN:  No rashes no nodules NEURO:  Cranial nerves II through XII grossly intact, motor grossly intact throughout PSYCH:  Cognitively intact, oriented to person place and time   EKG:  The EKG was personally reviewed and demonstrates:  Atrial  fibrillation.  Rate 53 bpm.  RBBB.  LAD. Telemetry:  Telemetry was personally reviewed and demonstrates:  n/a  Relevant CV Studies:  Echo 10/28/19: 1. Left ventricular ejection fraction, by estimation, is 60 to 65%. The  left ventricle has normal function. The left ventricle has no regional  wall motion abnormalities. Unable to assess LV diastolic filling due to  underlying atrial fibrillation.  2. Right ventricular systolic function is moderately reduced. The right  ventricular size is moderately enlarged. There is moderately elevated  pulmonary artery systolic pressure. The estimated right ventricular  systolic pressure is [id number] mmHg.  3. Right atrial size was severely dilated.  4. The mitral valve is normal in structure. Mild mitral valve  regurgitation. No evidence of mitral stenosis.  5. Tricuspid valve  regurgitation is mild to moderate.  6. The aortic valve is tricuspid. Aortic valve regurgitation is not  visualized. Mild aortic valve sclerosis is present, with no evidence of  aortic valve stenosis.  7. The inferior vena cava is dilated in size with <50% respiratory  variability, suggesting right atrial pressure of 15 mmHg.   Laboratory Data:  High Sensitivity Troponin:  No results for input(s): TROPONINIHS in the last 720 hours.   Chemistry Recent Labs  Lab 10/31/19 0544 11/01/19 0533 11/02/19 0604  NA 132* 132* 133*  K 4.3 4.5 4.7  CL 99 100 98  CO2 26 26 27   GLUCOSE 98 92 93  BUN 24* 23 16  CREATININE 0.80 0.83 0.77  CALCIUM 8.6* 8.8* 9.1  GFRNONAA >60 >60 >60  GFRAA >60 >60 >60  ANIONGAP 7 6 8     Recent Labs  Lab 10/27/19 1200 10/31/19 0544  PROT 6.6 5.0*  ALBUMIN 3.4* 2.3*  AST 30 60*  ALT 18 49*  ALKPHOS 100 97  BILITOT 1.0 0.9   Hematology Recent Labs  Lab 10/31/19 0544 11/01/19 0533 11/02/19 0604  WBC 9.4 10.4 11.9*  RBC 3.52* 3.40* 3.84*  HGB 10.5* 10.6* 11.7*  HCT 33.3* 33.1* 36.6  MCV 94.6 97.4 95.3  MCH 29.8 31.2 30.5  MCHC 31.5 32.0 32.0  RDW 13.5 13.7 13.6  PLT 222 225 268   BNPNo results for input(s): BNP, PROBNP in the last 168 hours.  DDimer No results for input(s): DDIMER in the last 168 hours.   Radiology/Studies:  DG Chest 2 View  Result Date: 11/02/2019 CLINICAL DATA:  Leukocytosis EXAM: CHEST - 2 VIEW COMPARISON:  October 29, 2019 FINDINGS: There remains airspace opacity with consolidation in portions of the left upper lobe. There is a calcified granuloma in this area as well. There is more ill-defined opacity in the right upper lobe and left base regions. There is cardiomegaly with pulmonary vascularity normal. No adenopathy. There is aortic atherosclerosis. There is a prior fracture of the proximal left humerus, unchanged. IMPRESSION: Multifocal pneumonia with consolidation most notable in the left upper lobe, stable. Stable  cardiomegaly. Aortic Atherosclerosis (ICD10-I70.0). Fracture proximal left humerus, essentially unchanged from recent prior chest radiograph. Electronically Signed   By: Lowella Grip III M.D.   On: 11/02/2019 16:24      Assessment and Plan:   # Pre-syncope: Blood pressures and heart rates have been running low.  She had a classic vasovagal episode after straining to have a BM.  BP and heart rate are low likely 2/2 post-op state and pain mediation.  Encouraged oral hydration.  LVEF normal on pre-op echo.  # Hypertension: At baseline she is on bisoprolol, diltiazem, and telmisartan.  Will reduce  diltiazem to 120mg  and bisoprolol to 5 mg.   # Chronic atrial fibrillation:  Rates are controlled on bisoprolol, diltiazem.  She is on warfarin for anticoagulation. Reduce bisoprolol and diltiazem as above.   # Chronic diastolic heart failure: # Pulmonary hypertension: Pulmonary pressure elevated 2/2 underlying lung disease.  Once BP improves, diurese as able.  IVC was dilated on 4/28.    # Hyperlipidemia:  Continue pravastatin.       For questions or updates, please contact [facility] Please consult [email or web address] for contact info under     Signed, Skeet Latch, MD  11/02/2019 4:47 PM

## 2019-11-02 NOTE — Progress Notes (Signed)
Inpatient Rehabilitation  Patient information reviewed and entered into eRehab system by Abhay Godbolt M. Taunja Brickner, M.A., CCC/SLP, PPS Coordinator.  Information including medical coding, functional ability and quality indicators will be reviewed and updated through discharge.    

## 2019-11-02 NOTE — Care Management (Signed)
[facility] Individual Statement of Services  Patient Name:  Stephanie Cortez  Date:  11/02/2019  Welcome to the [facility].  Our goal is to provide you with an individualized program based on your diagnosis and situation, designed to meet your specific needs.  With this comprehensive rehabilitation program, you will be expected to participate in at least 3 hours of rehabilitation therapies Monday-Friday, with modified therapy programming on the weekends.  Your rehabilitation program will include the following services:  Physical Therapy (PT), Occupational Therapy (OT), Speech Therapy (ST), 24 hour per day rehabilitation nursing, Therapeutic Recreaction (TR), Neuropsychology, Case Management (Social Worker), Rehabilitation Medicine, Nutrition Services, Pharmacy Services and Other  Weekly team conferences will be held on Wednesdays to discuss your progress.  Your Social Worker will talk with you frequently to get your input and to update you on team discussions.  Team conferences with you and your family in attendance may also be held.  Expected length of stay: 8-12 Days  Overall anticipated outcome: MOD I/Supervision  Depending on your progress and recovery, your program may change. Your Social Worker will coordinate services and will keep you informed of any changes. Your Social Worker's name and contact numbers are listed  below.  The following services may also be recommended but are not provided by the [facility]:    [facility] will be made to provide these services after discharge if needed.  Arrangements include referral to agencies that provide these services.  Your insurance has been verified to be:  Medicare Your primary doctor is:  [name]  Pertinent information will be shared with your doctor and your insurance company.  Social Worker:   Erlene Quan, [facility] or ([phone]   Information discussed with and copy given to patient by: Dyanne Iha, 11/02/2019, 11:37 AM

## 2019-11-02 NOTE — Progress Notes (Signed)
Occupational Therapy Session Note  Patient Details  Name: Stephanie Cortez MRN: [id number] Date of Birth: May 22, 1934  Today's Date: 11/02/2019 OT Individual Time: [id number] OT Individual Time Calculation (min): 55 min    Short Term Goals: Week 1:  OT Short Term Goal 1 (Week 1): Pt will thread BLE into pants wiht AE PRN and MIN VC OT Short Term Goal 2 (Week 1): Pt will don socks with AE PRN and MIN VC OT Short Term Goal 3 (Week 1): Pt will complete CM for toileting with MIN A for standing balnace OT Short Term Goal 4 (Week 1): Pt will complete toilet transfer wiht MIN A  Skilled Therapeutic Interventions/Progress Updates:    Treatment session with focus on sit > stand, dynamic standing balance, and awareness of hip precautions during functional tasks.  Pt received upright in w/c asleep.  Pt easily aroused, reporting she is sleepy from therapy session and medications.  Pt agreeable to participation in this therapy session.  Pt on 2L O2 94% at rest.  Engaged in sit > stand at high low table in therapy gym with mod assist overall, requiring frequent cues for hand placement and anterior weight shift for sit > stand.  Pt demonstrating difficulty recalling directions of simple matching activity.  O2 sats 97% on 2L after standing activity, therefore removed O2 during next stand.  Sats dropped to 87% on room air, unable to increase despite cues for breathing technique therefore returned to 2L O2 via nasal cannula with sats returning to 95%.  Engaged in blocked practice sit > stand with pt consistently requiring mod assist, one instance of min but unable to replicate during next attempt.  Returned to room and transferred back to bed due to fatigue.  Pt required mod assist stand pivot transfer to Lt and assist to lift BLE back in to bed.  Pt falling asleep as therapist exiting room.  Therapy Documentation Precautions:  Precautions Precautions: Posterior Hip Precaution Booklet Issued: Yes  (comment) Precaution Comments: Unable to recall.  All THP reviewed Restrictions Weight Bearing Restrictions: No RLE Weight Bearing: Weight bearing as tolerated Pain:  Pt with c/o pain in Rt hip, rated 5-6/10.  Premedicated.   Therapy/Group: Individual Therapy  Simonne Come 11/02/2019, 12:20 PM

## 2019-11-02 NOTE — Progress Notes (Signed)
Social Work Assessment and Plan   Patient Details  Name: Stephanie Cortez MRN: [id number] Date of Birth: 06/09/1934  Today's Date: 11/02/2019  Problem List:  Patient Active Problem List   Diagnosis Date Noted  . Chronic anticoagulation   . Acute blood loss anemia   . Transaminitis   . Hypoalbuminemia due to protein-calorie malnutrition ([place])   . Labile blood pressure   . Drug induced constipation   . Supplemental oxygen dependent   . Postoperative pain   . Subcapital fracture of femur ([place]) 10/30/2019  . Post-operative state   . Prerenal azotemia   . Leukocytosis   . Closed right hip fracture, initial encounter ([place]) 10/27/2019  . Hoarseness 02/19/2018  . Sensorineural hearing loss (SNHL) of both ears 02/19/2018  . Tinnitus, bilateral 02/19/2018  . Cough 09/06/2017  . Cough variant asthma 08/19/2017  . Acute bronchitis 02/20/2016  . Pulmonary air trapping 01/04/2016  . Encounter for preoperative pulmonary examination 01/04/2016  . NSIP (nonspecific interstitial pneumonia) ([place]) 09/26/2015  . Chronic sinusitis 09/26/2015  . ILD (interstitial lung disease) ([place]) 07/12/2014  . Upper airway cough syndrome 09/23/2013  . Encounter for therapeutic drug monitoring 08/11/2013  . Congestive heart failure ([place]) 07/08/2013  . Coronary artery calcification seen on CAT scan 03/15/2013  . Nodule of left lung 03/15/2013  . Orthostatic lightheadedness 02/09/2013  . Situational mixed anxiety and depressive disorder   . Hyponatremia 04/03/2012  . Diverticulitis 01/01/2012  . GERD with stricture 01/01/2012  . Hypothyroid 11/23/2010  . Edema 10/04/2010  . Bronchiectasis ([place])   . HYPERTENSION, BENIGN 10/22/2008  . A-fib [facility])    Past Medical History:  Past Medical History:  Diagnosis Date  . Anemia    - Hgb 9.7gm% on 07/13/2008 in [place] -  Hgg 129gm% wiht normal irone levsl and ferritin 10/27/2008 in [place] Recurrent otitis/sinusitis  . Anxiety    chronic BZ prn  . Atrial fibrillation  (HCC)    chronic anticoag  . Bronchiectasis    >PFT 07/13/2008 in [place] 1.9L/76%, FVC 2.45L/74, Ratio 79, TLC 121%, DLCO 64%  AE BRonchiectasis - Dec 2010.New Rx:  outpatient - Feb 2011 - Rx outpatient  . CHF (congestive heart failure) ([place])   . COPD (chronic obstructive pulmonary disease) (HCC)    bronchiectasis  . Cricopharyngeal achalasia   . Depression   . Diverticulosis   . Dyslipidemia   . Eczema   . GERD with stricture   . Glaucoma   . H. pylori infection   . Hypertension   . Hyponatremia    chronic, s/p endo eval 06/2012  . Hypothyroid   . IBS (irritable bowel syndrome)   . Lumbar disc disease   . Neuropathy of both feet   . Segmental colitis ([place])   . Vertigo   . Wears glasses    Past Surgical History:  Past Surgical History:  Procedure Laterality Date  . BREAST SURGERY     br bx  . CARDIAC CATHETERIZATION  07/01/2013  . CATARACT EXTRACTION     both  . COLONOSCOPY    . ESOPHAGOSCOPY W/ BOTOX INJECTION  12/11/2011   Procedure: ESOPHAGOSCOPY WITH BOTOX INJECTION;  Surgeon: Rozetta Nunnery, MD;  Location: [place];  Service: ENT;  Laterality: N/A;  esophogoscopy with dilation, botox injection  . FOOT SURGERY  03/14/2011   gastroc slide-rt  . HIP ARTHROPLASTY Right 10/28/2019   Procedure: ARTHROPLASTY BIPOLAR HIP (HEMIARTHROPLASTY);  Surgeon: Marybelle Killings, MD;  [facility];  Service:  Orthopedics;  Laterality: Right;  . TOTAL ABDOMINAL HYSTERECTOMY    . WISDOM TOOTH EXTRACTION     Social History:  reports that she has never smoked. She has never used smokeless tobacco. She reports that she does not drink alcohol or use drugs.  Family / Support Systems Marital Status: Separated Children: 2 Children (daughter in [place], son in [place]) Other Supports: Daughters husband Anticipated Caregiver: Daughter [name] Ability/Limitations of Caregiver: N/a Caregiver Availability: 24/7  Social History Preferred language: English Religion:  Unknown Read: Yes Write: Yes Employment Status: Retired   Abuse/Neglect Abuse/Neglect Assessment Can Be Completed: Yes Physical Abuse: Denies Verbal Abuse: Denies Sexual Abuse: Denies Exploitation of patient/patient's resources: Denies Self-Neglect: Denies  Emotional Status Pt's affect, behavior and adjustment status: Patient states she is just in shock Recent Psychosocial Issues: N/a Psychiatric History: N/a Substance Abuse History: N/a  Patient / Family Perceptions, Expectations & Goals Pt/Family understanding of illness & functional limitations: Yes Pt/family expectations/goals: Goal to discharge back home  [place]: None Premorbid Home Care/DME Agencies: None Transportation available at discharge: Son in [place] able to tranport  Discharge Planning Living Arrangements: [place]: Children Type of Residence: Private residence(About 2 steps to enter) Financial Resources: Social Security Financial Screen Referred: Yes Living Expenses: Own Does the patient have any problems obtaining your medications?: No Social Work Anticipated Follow Up Needs: HH/OP Expected length of stay: 5-10 Days  Clinical Impression SW entered room to meet patient, introduce self and explain role. Daughter at beside answered some discharge questions. Both very attentive and pleasant. SW will follow up with any question or concerns.   Dyanne Iha 11/02/2019, 1:03 PM

## 2019-11-02 NOTE — Plan of Care (Signed)
  Problem: Consults Goal: RH GENERAL PATIENT EDUCATION Description: See Patient Education module for education specifics. Outcome: Progressing   Problem: RH BLADDER ELIMINATION Goal: RH STG MANAGE BLADDER WITH ASSISTANCE Description: STG Manage Bladder With Min Assistance Outcome: Progressing   Problem: RH SKIN INTEGRITY Goal: RH STG SKIN FREE OF INFECTION/BREAKDOWN Description: No new breakdown with min assist  Outcome: Progressing Goal: RH STG ABLE TO PERFORM INCISION/WOUND CARE W/ASSISTANCE Description: STG Able To Perform Incision/Wound Care With [employer]. Outcome: Progressing   Problem: RH SAFETY Goal: RH STG ADHERE TO SAFETY PRECAUTIONS W/ASSISTANCE/DEVICE Description: STG Adhere to Safety Precautions With Min Assistance/Device. Outcome: Progressing   Problem: RH PAIN MANAGEMENT Goal: RH STG PAIN MANAGED AT OR BELOW PT'S PAIN GOAL Description: < 3 out of 10.  Outcome: Progressing   Problem: RH BOWEL ELIMINATION Goal: RH STG MANAGE BOWEL WITH ASSISTANCE Description: STG Manage Bowel with min Assistance. Outcome: Progressing Goal: RH STG MANAGE BOWEL W/MEDICATION W/ASSISTANCE Description: STG Manage Bowel with Medication with min Assistance. Outcome: Progressing

## 2019-11-02 NOTE — Progress Notes (Signed)
Social Work Patient ID: Stephanie Cortez, female   DOB: 02-22-1934, 86 y.o.   MRN: [id number]  SW provided daughter with [place] and [place].

## 2019-11-02 NOTE — Progress Notes (Signed)
Physical Therapy Session Note  Patient Details  Name: Stephanie Cortez MRN: [id number] Date of Birth: August 04, 1933  Today's Date: 11/02/2019 PT Individual Time: 0800-0856 PT Individual Time Calculation (min): 56 min   Short Term Goals: Week 1:  PT Short Term Goal 1 (Week 1): Pt will transfer bed<>chair w/ min assist PT Short Term Goal 2 (Week 1): Pt will ambulate 25' w/ min assist w/ LRAD PT Short Term Goal 3 (Week 1): Pt will initiate stair training  Skilled Therapeutic Interventions/Progress Updates:   Received pt supine in bed, pt agreeable to therapy, and reported 7/10 pain in R hip, however reported she recently received pain medication. Repositioning and distraction done to reduce pain. Pt on 2L O2 via [place] throughout session. Session focused on functional mobility/transfers, LE strength, dynamic standing balance/coordination, ambulation, and improved activity tolerance. Therapist located cushion for pt's 18x18 WC. Pt educated on posterior hip precautions and educated pt on importance of avoiding hip flexion >90 degrees, hip adduction, and IR; pt verbalized understanding but with poor carry over requiring verbal cues throughout session. Pt donned pants in supine with mod A and transferred supine<>sitting EOB with min A and use of bedrails. Pt transferred sit<>stand with RW mod A and required max A to pull pants over hips. Pt with posterior lean upon standing requiring mod A to correct. Pt transferred bed<>WC stand<>pivot with RW mod A. O2 sat 86% increasing to 96% on 2L O2 with 3 minute seated rest break. Pt transported to therapy gym in [facility] total assist for time management purposes. Pt ambulated 75ft with RW min A. O2 sat 88% increasing to 96% on 2L O2 with 2 minute seated rest break. Pt ambulated additional 4ft with RW min A with significantly increased time. O2 sat 80% increasing to 92% on 2L O2 with 4 minute seated rest break. Pt educated heavily on pursed lip breathing techniques throughout  session. Pt required multiple rest/water breaks throughout session due to increased fatigue and poor activity tolerance. Pt transferred sit<>stand with RW mod A x 3 trials with verbal cues for hand placement when pushing up. Pt transferred stand<>pivot mat<>WC with RW mod A and when stepping backwards to get to [facility], demonstrated LOB posteriorly into WC requiring max A to control descent. Pt educated on technique and turning sequence for safety. Pt transported back to room in [facility] total assist. Concluded session with pt sitting in WC, needs within reach, and seatbelt alarm on.   Therapy Documentation Precautions:  Precautions Precautions: Posterior Hip Precaution Booklet Issued: Yes (comment) Precaution Comments: Unable to recall.  All THP reviewed Restrictions Weight Bearing Restrictions: No RLE Weight Bearing: Weight bearing as tolerated  Therapy/Group: Individual Therapy Alfonse Alpers PT, DPT   11/02/2019, 7:24 AM

## 2019-11-02 NOTE — Progress Notes (Signed)
Patient had vasovagal episode while at sink washing hands. Had gotten off BSC trying to have BM. Reported to be bradycardic and pale. SBP low and EKG showed A fib with slow ventricular response. Will hold Zebeta. Reorder orthostatic vitals. Check CXR as has been fatigued and hypoxic with activity. Cardiology consulted to evaluate meds and for input on additional work up.

## 2019-11-02 NOTE — Progress Notes (Signed)
Occupational Therapy Session Note  Patient Details  Name: Stephanie Cortez MRN: [id number] Date of Birth: 1933/10/08  Today's Date: 11/02/2019 OT Individual Time: [id number] OT Individual Time Calculation (min): 42 min    Short Term Goals: Week 1:  OT Short Term Goal 1 (Week 1): Pt will thread BLE into pants wiht AE PRN and MIN VC OT Short Term Goal 2 (Week 1): Pt will don socks with AE PRN and MIN VC OT Short Term Goal 3 (Week 1): Pt will complete CM for toileting with MIN A for standing balnace OT Short Term Goal 4 (Week 1): Pt will complete toilet transfer wiht MIN A  Skilled Therapeutic Interventions/Progress Updates:    Treatment session with focus on functional mobility, sit > stand, and functional transfers.  Pt received semi-reclined in bed with daughter present.  Pt and daughter report that pt had had a ~1 hr nap before lunch and just recently received some pain meds and something for her bowels.  Pt reports desire to attempt toileting during session.  Completed bed mobility with increased time and min assist to come to EOB.  Completed sit > stand with mod assist and mod multimodal cues for hand placement.  Completed stand pivot transfer bed > w/c > BSC over toilet with RW with mod assist.  Pt required max assist with clothing management pre and post toileting.  Returned to w/c and set up at sink to engage in grooming tasks.  Pt had placed soap in hand and then became increasingly unresponsive.  Pt with head rolling back and eyes gazing upward, lasted ~30 seconds.  Vitals assessed - BP 123/58, HR 53, and O2 98 on 2L O2.  RN notified and in to assess pt.  Pt diaphoretic and returned to bed due to increased lethargy.  Mod assist for stand pivot back to bed with pt keeping eyes closed remainder of session with bed positioning.   EKG arriving at end of session.  Therapy Documentation Precautions:  Precautions Precautions: Posterior Hip Precaution Booklet Issued: Yes (comment) Precaution  Comments: Unable to recall.  All THP reviewed Restrictions Weight Bearing Restrictions: No RLE Weight Bearing: Weight bearing as tolerated General:   Vital Signs: Therapy Vitals Temp: 98 F (36.7 C) Temp Source: Oral Pulse Rate: (!) 53 Resp: 20 BP: (!) 104/46 Oxygen Therapy SpO2: 95 % Pain:  Pt with no c/o pain during session.   Therapy/Group: Individual Therapy  Simonne Come 11/02/2019, 2:28 PM

## 2019-11-02 NOTE — Consult Note (Signed)
NAME:  Stephanie Cortez, MRN:  [id number], DOB:  1933-09-11, LOS: 3 ADMISSION DATE:  10/30/2019, CONSULTATION DATE:  11/02/2019 REFERRING MD:  Algis Liming, PA, CHIEF COMPLAINT:  Syncopal/ LUL opacity  Brief History   16 yoF followed by Dr. Chase Caller for ILD and being evaluated for HSP, currently in CIR following right hip fracture s/p repair.  Had vasovagal event 5/3 with workup concerning for LUL changes. Pulmonary asked to evaluate.   History of present illness    86 year old female with prior history significant for but not limited to ILD, chronic hypoxic respiratory failure on 2L [address], afib on coumadin, pulmonary hypertension, HFpEF, HTN who is currently in patient rehab following right proximal femur after mechanical fall on 10/27/2019.  Underwent right hip hemiarthroplasty 4/28.  Hospitalization complicated by post operative hypotension and hyponatremia.  Discharged to rehab on 4/30.    She is followed in our clinic by Dr. Chase Caller for ILD.  Most recently seen 09/03/2019 with ongoing shortness of breath.  HRCT showed significant worsening with development of upper lobe ground glass opacities and inflammatory changes with air trapping suggestive of hypersensitive pneumonitis.  Plans were to keep off steroids with plans for bronchoscopy.  Patient has canceled outpatient bronch twice, daughter not specific on reasons.     Today, had vasovagal episode while washing her hands after using bedside commode trying to have bowel movement due to ongoing constipation since surgery.  She was bradycardic and pale with slow afib on EKG.  Her bisoprolol was held and cardiology consulted.  She had also complained of being fatigued with exertional desaturations.  CXR obtained which showed ongoing left upper lobe consolidation, noted back on 4/27 which was concerning for superimposed pneumonia.  However, given patient clinically did not have any symptoms, afebrile, and had interval improvement on follow up CXR 4/29, she was  clinically watched.  She remains afebrile with low fluctuating WBC, most recently 11.9 yesterday.  Patient reports currently no shortness of breath at rest, just with exertion which is her baseline.  No change in cough or sputum production.  O2 needs unchanged.  Pulmonary asked to evaluate for further recommendations.   Past Medical History  afib on coumadin, pulmonary hypertension, HFrEF, HTN, ILD, bronchiectasis, chronic hypoxic respiratory failure on 2L [place], depression, neuropathy, hyponatremia, CAD, chronic anemia  Significant Hospital Events   4/27 >>4/30 Hospitalized 4/30 CIR >>  Consults:  Cardiology   Procedures:   Significant Diagnostic Tests:   Micro Data:  UC >>  Antimicrobials:   Interim history/subjective:  Currently asymptomatic   Objective   Blood pressure (!) 104/46, pulse (!) 53, temperature 98 F (36.7 C), temperature source Oral, resp. rate 20, height 5\' 6"  (1.676 m), weight 84.6 kg, SpO2 95 %.        Intake/Output Summary (Last 24 hours) at 11/02/2019 1744 Last data filed at 11/02/2019 1321 Gross per 24 hour  Intake 680 ml  Output 900 ml  Net -220 ml   Filed Weights   10/31/19 0509 11/01/19 0557 11/02/19 0402  Weight: 84.8 kg 85.2 kg 84.6 kg    Examination: General:  Frail appearing elderly female lying in bed in NAD HEENT: MM pink/moist Neuro: Alert, oriented, MAE CV: RR PULM:  Non labored, diffuse crackles RUL/ LUL/ LLL GI: soft, bs+ Extremities: warm/dry, no LE edema  Skin: no rashes   Resolved Hospital Problem list    Assessment & Plan:   LUL opacification - unchanged from prior CXR, clinically asymptomatic, no cough, sputum  production, change in baseline chronic but progressive DOE which is being worked up as outpatient  P:  Continue to clinically monitor.  Expect some degree of deconditioning from fall/ hip fx.   If clinically worsens or increased O2 demands, beyond exertional dyspnea, will need further workup.  Continue aggressive  pulmonary hygiene   ILD with concern for HSP Chronic hypoxic respiratory failure, O2 dependent - follow-up with Dr. Chase Caller as outpatient, hopefully will reschedule outpatient bronchoscopy, as she has canceled twice prior  Remainder per primary.   Nothing further to add.  PCCM will sign off.  Please do not hesitate to call us back if we can be of any further assistance.   Labs   CBC: Recent Labs  Lab 10/27/19 1200 10/28/19 0250 10/29/19 0309 10/30/19 0313 10/31/19 0544 11/01/19 0533 11/02/19 0604  WBC 15.6*   < > 9.7 14.5* 9.4 10.4 11.9*  NEUTROABS 14.2*  --   --   --  6.9  --   --   HGB 13.1   < > 10.7* 10.7* 10.5* 10.6* 11.7*  HCT 41.7   < > 35.7* 34.7* 33.3* 33.1* 36.6  MCV 96.8   < > 100.3* 98.3 94.6 97.4 95.3  PLT 253   < > 171 178 222 225 268   < > = values in this interval not displayed.    Basic Metabolic Panel: Recent Labs  Lab 10/29/19 0309 10/30/19 0313 10/31/19 0544 11/01/19 0533 11/02/19 0604  NA 127* 128* 132* 132* 133*  K 4.4 4.1 4.3 4.5 4.7  CL 97* 97* 99 100 98  CO2 22 21* 26 26 27   GLUCOSE 146* 125* 98 92 93  BUN 20 34* 24* 23 16  CREATININE 0.69 1.00 0.80 0.83 0.77  CALCIUM 8.3* 8.4* 8.6* 8.8* 9.1   GFR: Estimated Creatinine Clearance: 57.4 mL/min (by C-G formula based on SCr of 0.77 mg/dL). Recent Labs  Lab 10/30/19 0313 10/31/19 0544 11/01/19 0533 11/02/19 0604  WBC 14.5* 9.4 10.4 11.9*    Liver Function Tests: Recent Labs  Lab 10/27/19 1200 10/31/19 0544  AST 30 60*  ALT 18 49*  ALKPHOS 100 97  BILITOT 1.0 0.9  PROT 6.6 5.0*  ALBUMIN 3.4* 2.3*   No results for input(s): LIPASE, AMYLASE in the last 168 hours. No results for input(s): AMMONIA in the last 168 hours.  ABG    Component Value Date/Time   TCO2 23 12/11/2011 0707     Coagulation Profile: Recent Labs  Lab 10/29/19 0309 10/30/19 0313 10/31/19 0544 11/01/19 0533 11/02/19 0604  INR 1.6* 2.3* 2.4* 2.1* 1.7*    Cardiac Enzymes: No results for  input(s): CKTOTAL, CKMB, CKMBINDEX, TROPONINI in the last 168 hours.  HbA1C: Hgb A1c MFr Bld  Date/Time Value Ref Range Status  10/07/2012 12:00 AM 5.4 4.0 - 6.0 % Final  04/06/2011 12:14 PM 5.7 4.6 - 6.5 % Final    Comment:    Glycemic Control Guidelines for People with Diabetes:Non Diabetic:  <6%Goal of Therapy: <7%Additional Action Suggested:  >8%     CBG: No results for input(s): GLUCAP in the last 168 hours.  Review of Systems:   As per HPI otherwise negative.   Past Medical History  She,  has a past medical history of Anemia, Anxiety, Atrial fibrillation ([place]), Bronchiectasis, CHF (congestive heart failure) ([place]), COPD (chronic obstructive pulmonary disease) ([place]), Cricopharyngeal achalasia, Depression, Diverticulosis, Dyslipidemia, Eczema, GERD with stricture, Glaucoma, H. pylori infection, Hypertension, Hyponatremia, Hypothyroid, IBS (irritable bowel syndrome), Lumbar disc disease, Neuropathy  of both feet, Segmental colitis ([place]), Vertigo, and Wears glasses.   Surgical History    Past Surgical History:  Procedure Laterality Date  . BREAST SURGERY     br bx  . CARDIAC CATHETERIZATION  07/01/2013  . CATARACT EXTRACTION     both  . COLONOSCOPY    . ESOPHAGOSCOPY W/ BOTOX INJECTION  12/11/2011   Procedure: ESOPHAGOSCOPY WITH BOTOX INJECTION;  Surgeon: Rozetta Nunnery, MD;  Location: [place];  Service: ENT;  Laterality: N/A;  esophogoscopy with dilation, botox injection  . FOOT SURGERY  03/14/2011   gastroc slide-rt  . HIP ARTHROPLASTY Right 10/28/2019   Procedure: ARTHROPLASTY BIPOLAR HIP (HEMIARTHROPLASTY);  Surgeon: Marybelle Killings, MD;  [facility];  Service: Orthopedics;  Laterality: Right;  . TOTAL ABDOMINAL HYSTERECTOMY    . WISDOM TOOTH EXTRACTION       Social History   reports that she has never smoked. She has never used smokeless tobacco. She reports that she does not drink alcohol or use drugs.   Family History   Her family history  includes Atrial fibrillation in an other family member; Cancer in her father; Colon polyps in her sister; Emphysema in her brother; Hypertension in her mother; Kidney disease in her sister; Other in her father; Pancreatic cancer in her sister; Stroke in her mother.   Allergies Allergies  Allergen Reactions  . Doxycycline     Rash on face and neck  . Flagyl [Metronidazole] Nausea And Vomiting  . Moxifloxacin Other (See Comments)     Weakness/fatigue  . Pantoprazole Sodium Rash  . Penicillins Rash    Has patient had a PCN reaction causing immediate rash, facial/tongue/throat swelling, SOB or lightheadedness with hypotension:unsure Has patient had a PCN reaction causing severe rash involving mucus membranes or skin necrosis:unsure Has patient had a PCN reaction that required hospitalization:No Has patient had a PCN reaction occurring within the last 10 years:Yes Cannot recall exact reaction due to time lapse If all of the above answers are "NO", then may proceed with Cephalosporin use.      Home Medications  Prior to Admission medications   Medication Sig Start Date End Date Taking? Authorizing Provider  Biotin 5000 MCG TABS Take 5,000 mcg by mouth daily.    [provider]  bisoprolol (ZEBETA) 10 MG tablet Take 1 tablet by mouth daily. Please monitor your blood pressure and heart rate daily for a couple of weeks to make sure they remain stable. Patient taking differently: Take 10 mg by mouth daily. Please monitor your blood pressure and heart rate daily for a couple of weeks to make sure they remain stable. 07/20/19   Deboraha Sprang, MD  Cholecalciferol (VITAMIN D-3) 125 MCG (5000 UT) TABS Take 5,000 Units by mouth daily.     [provider]  dextromethorphan-guaiFENesin (MUCINEX DM) 30-600 MG 12hr tablet Take 1-2 tablets by mouth 2 (two) times daily as needed (for allegies/respiratory issues.).    [provider]  diltiazem (CARDIZEM CD) 240 MG 24 hr capsule  Take 1 capsule (240 mg total) by mouth daily. Patient taking differently: Take 240 mg by mouth at bedtime.  02/16/19   Shirley Friar, PA-C  diphenhydrAMINE (BENADRYL) 25 MG tablet Take 25 mg by mouth every 6 (six) hours as needed for allergies.    [provider]  docusate sodium (COLACE) 100 MG capsule Take 1 capsule (100 mg total) by mouth 2 (two) times daily. 10/30/19   Harold Hedge, MD  FLUoxetine (PROZAC) 10 MG tablet Take 10 mg by mouth 2 (two) times daily as needed (anxiety).     [provider]  fluticasone (FLONASE) 50 MCG/ACT nasal spray Place 1 spray into both nostrils 2 (two) times daily as needed (for nasal congestion).    [provider]  folic acid (FOLVITE) A999333 MCG tablet Take 1 mg by mouth 3 (three) times a week. MWF    [provider]  furosemide (LASIX) 20 MG tablet Take 20 mg by mouth daily as needed for fluid.     [provider]  HYDROcodone-acetaminophen (NORCO/VICODIN) 5-325 MG tablet Take 1-2 tablets by mouth every 6 (six) hours as needed for moderate pain. 10/29/19 10/28/20  Marybelle Killings, MD  levothyroxine (SYNTHROID, LEVOTHROID) 50 MCG tablet Take 1 tablet (50 mcg total) by mouth daily. Patient taking differently: Take 50 mcg by mouth daily before breakfast.  08/12/13   Rowe Clack, MD  olmesartan (BENICAR) 40 MG tablet Take 40 mg by mouth daily.    [provider]  omeprazole (PRILOSEC) 20 MG capsule TAKE 1 CAPSULE(20 MG) BY MOUTH DAILY Patient taking differently: Take 20 mg by mouth daily.  11/07/15   Pyrtle, Lajuan Lines, MD  pravastatin (PRAVACHOL) 40 MG tablet Take 40 mg by mouth at bedtime.  10/03/16   [provider]  SYMBICORT 80-4.5 MCG/ACT inhaler INHALE 2 PUFFS INTO THE LUNGS TWICE DAILY Patient taking differently: Inhale 2 puffs into the lungs in the morning and at bedtime.  06/17/19   Brand Males, MD  vitamin B-12 (CYANOCOBALAMIN) 1000 MCG tablet Take 1,000 mcg by mouth 3 (three) times a  week. MWF    [provider]  warfarin (COUMADIN) 2.5 MG tablet Take 1/2-1 tablet by mouth daily as directed by [facility] Patient taking differently: Take 1.25-2.5 mg by mouth See admin instructions. Take 1 tablet (2.5 mg)  Tuesday Thursday & Saturday Sunday Take 0.5 tablet (1.25 mg) Monday. Wednesday & Friday 01/27/16   Deboraha Sprang, MD  zinc gluconate 50 MG tablet Take 50 mg by mouth daily.    [provider]          CCT:   Kennieth Rad, MSN, AGACNP-BC [facility] 11/02/2019, 6:53 PM  See Amion for personal pager PCCM on call pager [phone]

## 2019-11-02 NOTE — Progress Notes (Signed)
[place] PHYSICAL MEDICINE & REHABILITATION PROGRESS NOTE  Subjective/Complaints: Doing fairly well. Says she likes to drink a lot but doesn't want to bother staff to have to take her to the bathroom.   ROS: Patient denies fever, rash, sore throat, blurred vision, nausea, vomiting, diarrhea, cough, shortness of breath or chest pain, joint or back pain, headache, or mood change.    Objective: Vital Signs: Blood pressure (!) 140/54, pulse 61, temperature 98.5 F (36.9 C), temperature source Oral, resp. rate 18, height 5\' 6"  (1.676 m), weight 84.6 kg, SpO2 93 %. No results found. Recent Labs    11/01/19 0533 11/02/19 0604  WBC 10.4 11.9*  HGB 10.6* 11.7*  HCT 33.1* 36.6  PLT 225 268   Recent Labs    11/01/19 0533 11/02/19 0604  NA 132* 133*  K 4.5 4.7  CL 100 98  CO2 26 27  GLUCOSE 92 93  BUN 23 16  CREATININE 0.83 0.77  CALCIUM 8.8* 9.1    Physical Exam: BP (!) 140/54 (BP Location: Right Arm)   Pulse 61   Temp 98.5 F (36.9 C) (Oral)   Resp 18   Ht 5\' 6"  (1.676 m)   Wt 84.6 kg   LMP  (LMP Unknown)   SpO2 93%   BMI 30.10 kg/m  Constitutional: No distress . Vital signs reviewed. HEENT: EOMI, oral membranes moist Neck: supple Cardiovascular: RRR without murmur. No JVD    Respiratory/Chest: CTA Bilaterally without wheezes or rales. Normal effort. O2 via [place] GI/Abdomen: BS +, non-tender, non-distended Ext: no clubbing, cyanosis, or edema Psych: pleasant and cooperative Skin: Right wound CDI with dressing Psych: Normal mood.  Normal behavior. Musc: right hip/thigh edema Neurological: Alert Motor: Motor: Left lower extremity: 4/5 hip flexion, knee extension, 4+/5 ankle dorsiflexion,stable Right lower extremity: Hip flexion, knee extension 3-/5, ankle dorsiflexion 4/5  Assessment/Plan: 1. Functional deficits secondary to right hip fracture status post right hip hemiarthroplasty which require 3+ hours per day of interdisciplinary therapy in a comprehensive  inpatient rehab setting.  Physiatrist is providing close team supervision and 24 hour management of active medical problems listed below.  Physiatrist and rehab team continue to assess barriers to discharge/monitor patient progress toward functional and medical goals  Care Tool:  Bathing    Body parts bathed by patient: Right arm, Left arm, Chest, Abdomen, Front perineal area, Right upper leg, Left upper leg   Body parts bathed by helper: Buttocks, Right lower leg, Left lower leg     Bathing assist Assist Level: Moderate Assistance - Patient 50 - 74%     Upper Body Dressing/Undressing Upper body dressing   What is the patient wearing?: Pull over shirt    Upper body assist Assist Level: Minimal Assistance - Patient > 75%    Lower Body Dressing/Undressing Lower body dressing      What is the patient wearing?: Incontinence brief     Lower body assist Assist for lower body dressing: Total Assistance - Patient < 25%     Toileting Toileting    Toileting assist Assist for toileting: Moderate Assistance - Patient 50 - 74%     Transfers Chair/bed transfer  Transfers assist  Chair/bed transfer activity did not occur: Safety/medical concerns  Chair/bed transfer assist level: Moderate Assistance - Patient 50 - 74%     Locomotion Ambulation   Ambulation assist   Ambulation activity did not occur: Safety/medical concerns          Walk 10 feet activity   Assist  Walk 10 feet activity did not occur: Safety/medical concerns        Walk 50 feet activity   Assist Walk 50 feet with 2 turns activity did not occur: Safety/medical concerns         Walk 150 feet activity   Assist Walk 150 feet activity did not occur: Safety/medical concerns         Walk 10 feet on uneven surface  activity   Assist Walk 10 feet on uneven surfaces activity did not occur: Safety/medical concerns         Wheelchair     Assist Will patient use wheelchair at  discharge?: (TBD) Type of Wheelchair: Manual    Wheelchair assist level: Supervision/Verbal cueing Max wheelchair distance: 51'    Wheelchair 50 feet with 2 turns activity    Assist        Assist Level: Supervision/Verbal cueing   Wheelchair 150 feet activity     Assist     Assist Level: Maximal Assistance - Patient 25 - 49%      Medical Problem List and Plan: 1.  Decreased endurance, dizziness with activity, problems with sequencing and poor safety awareness secondary to right hip fracture s/p right hip hemiarthroplasty.  Continue CIR 2.  Antithrombotics: -DVT/anticoagulation:  Pharmaceutical: Coumadin  INR 1.7 5/3, adjust warfarin dosing per pharmacy. No bridge             -antiplatelet therapy: N/A 3. Pain Management: Continue tramadol qid with tylenol prn.  Added ice prn for local measures.   Controlled with meds on 5/3             Monitor with increased exertion 4. Mood: LCSW to follow for evaluation and support.              -antipsychotic agents: N/A 5. Neuropsych: This patient is capable of making decisions on her own behalf. 6. Skin/Wound Care: Monitor wound daily for healing.  7. Fluids/Electrolytes/Nutrition: Monitor I/O.   8. Hyponatremia: Acute on chronic --baseline 130. Will monitor with strict I/Os   Sodium 133 5/3  Follow up again later in week 9. CAF: Monitor HR tid-on Cardizem, Zebata and coumadin.   Rate controlled on 5/3             Monitor with increased activity. 10. Chronic diastolic CHF: I/Os.  Continue irbesartan, Zebeta and pravachol.  Filed Weights   10/31/19 0509 11/01/19 0557 11/02/19 0402  Weight: 84.8 kg 85.2 kg 84.6 kg   ?Stable on 5/3 11. Bronchiectasis/COPD: On Dulera bid             weaning oxygen as able 13. Reactive leucocytosis: Resolved 14. Pre-renal azotemia: Resolved 15. GERD/cricopharyngeal achalasia: Resumed PPI. Needs to be upright for meals.  16. Drug induced constipation: Added senna to colace as had not had  BM since admission.              had small bm 5/1, tends to be constipated at home  -try sorbitol, fleet enema today 17.  Labile blood pressure  Controlled on 5/3 18.  Moderate hypoalbuminemia  Supplement initiated on 5/1 19.  Transaminitis  LFTs elevated on 5/1  Continue to monitor 20.  Acute blood loss anemia  Hemoglobin up to 11.7 5/3  LOS: 3 days A FACE TO [place] 11/02/2019, 11:37 AM

## 2019-11-02 NOTE — Progress Notes (Signed)
Pt given fleets enema as ordered with large results. Pt states that she feels much better after vagal episode. Pt resting in bed. Continue plan of care.

## 2019-11-03 ENCOUNTER — Inpatient Hospital Stay (HOSPITAL_COMMUNITY): Payer: Medicare Other

## 2019-11-03 ENCOUNTER — Ambulatory Visit: Payer: Medicare Other | Admitting: Internal Medicine

## 2019-11-03 ENCOUNTER — Inpatient Hospital Stay (HOSPITAL_COMMUNITY): Payer: Medicare Other | Admitting: Occupational Therapy

## 2019-11-03 DIAGNOSIS — R55 Syncope and collapse: Secondary | ICD-10-CM

## 2019-11-03 LAB — CBC
HCT: 31.8 % — ABNORMAL LOW (ref 36.0–46.0)
Hemoglobin: 10 g/dL — ABNORMAL LOW (ref 12.0–15.0)
MCH: 30.4 pg (ref 26.0–34.0)
MCHC: 31.4 g/dL (ref 30.0–36.0)
MCV: 96.7 fL (ref 80.0–100.0)
Platelets: 236 10*3/uL (ref 150–400)
RBC: 3.29 MIL/uL — ABNORMAL LOW (ref 3.87–5.11)
RDW: 13.7 % (ref 11.5–15.5)
WBC: 10.5 10*3/uL (ref 4.0–10.5)
nRBC: 0 % (ref 0.0–0.2)

## 2019-11-03 LAB — BASIC METABOLIC PANEL
Anion gap: 8 (ref 5–15)
BUN: 16 mg/dL (ref 8–23)
CO2: 24 mmol/L (ref 22–32)
Calcium: 8.5 mg/dL — ABNORMAL LOW (ref 8.9–10.3)
Chloride: 97 mmol/L — ABNORMAL LOW (ref 98–111)
Creatinine, Ser: 0.77 mg/dL (ref 0.44–1.00)
GFR calc Af Amer: >60 mL/min (ref >60)
GFR calc non Af Amer: >60 mL/min (ref >60)
Glucose, Bld: 90 mg/dL (ref 70–99)
Potassium: 5 mmol/L (ref 3.5–5.1)
Sodium: 129 mmol/L — ABNORMAL LOW (ref 135–145)

## 2019-11-03 LAB — URINALYSIS, ROUTINE W REFLEX MICROSCOPIC
Bacteria, UA: NONE SEEN
Bilirubin Urine: NEGATIVE
Glucose, UA: NEGATIVE mg/dL
Hgb urine dipstick: NEGATIVE
Ketones, ur: NEGATIVE mg/dL
Nitrite: NEGATIVE
Protein, ur: NEGATIVE mg/dL
Specific Gravity, Urine: 1.013 (ref 1.005–1.030)
pH: 6 (ref 5.0–8.0)

## 2019-11-03 LAB — PROTIME-INR
INR: 2.2 — ABNORMAL HIGH (ref 0.8–1.2)
Prothrombin Time: 23.7 seconds — ABNORMAL HIGH (ref 11.4–15.2)

## 2019-11-03 MED ORDER — TRAMADOL HCL 50 MG PO TABS
25.0000 mg | ORAL_TABLET | Freq: Four times a day (QID) | ORAL | Status: DC | PRN
  Administered 2019-11-03 – 2019-11-11 (×20): 25 mg via ORAL
  Filled 2019-11-03 (×21): qty 1

## 2019-11-03 MED ORDER — WARFARIN 1.25 MG HALF TABLET
1.2500 mg | ORAL_TABLET | ORAL | Status: DC
  Administered 2019-11-04 – 2019-11-06 (×2): 1.25 mg via ORAL
  Filled 2019-11-03 (×2): qty 1

## 2019-11-03 MED ORDER — WARFARIN SODIUM 2.5 MG PO TABS
2.5000 mg | ORAL_TABLET | ORAL | Status: DC
  Administered 2019-11-05 – 2019-11-07 (×2): 2.5 mg via ORAL
  Filled 2019-11-03 (×2): qty 1

## 2019-11-03 MED ORDER — ACETAMINOPHEN 325 MG PO TABS
650.0000 mg | ORAL_TABLET | Freq: Three times a day (TID) | ORAL | Status: DC
  Administered 2019-11-03 – 2019-11-15 (×50): 650 mg via ORAL
  Filled 2019-11-03 (×51): qty 2

## 2019-11-03 MED ORDER — WARFARIN SODIUM 2 MG PO TABS
2.0000 mg | ORAL_TABLET | Freq: Once | ORAL | Status: AC
  Administered 2019-11-03: 2 mg via ORAL
  Filled 2019-11-03: qty 1

## 2019-11-03 NOTE — Progress Notes (Signed)
Occupational Therapy Session Note  Patient Details  Name: Stephanie Cortez MRN: [id number] Date of Birth: 01-30-1934  Today's Date: 11/03/2019 OT Individual Time: 4315-4008 OT Individual Time Calculation (min): 68 min    Short Term Goals: Week 1:  OT Short Term Goal 1 (Week 1): Pt will thread BLE into pants wiht AE PRN and MIN VC OT Short Term Goal 2 (Week 1): Pt will don socks with AE PRN and MIN VC OT Short Term Goal 3 (Week 1): Pt will complete CM for toileting with MIN A for standing balnace OT Short Term Goal 4 (Week 1): Pt will complete toilet transfer wiht MIN A  Skilled Therapeutic Interventions/Progress Updates:  Patient met lying supine in bed in agreement with OT treatment session with focus on self-care re-education, functional transfers, activity tolerance, and household mobility as detailed below. Patient notes 9/10 pain in R hip secondary to muscle spasms. RN provided meds. Positive for orthostatic vitals monitored in supine, EOB, and in standing. Please refer to vitals section below. Supine to EOB with Min A and vc's for hand placement and sequencing. Stand-pivot transfer from EOB to wc with use of RW and Mod A with increased time/effort secondary to pain. Total A for wc transport to bathroom secondary to symptomatic orthostatics. Patient continent of bowel and bladder requiring Mod A for toileting/hygiene/clothing management. Dressing seated on BSC over toilet with supervision A for UB and Mod A for LB with education and use of AE. Patient able to thread BLE with increased time/effort but required assistance to hike brief/pants over hips in standing. Stand-pivot transfer to wc with Mod A and use of RW with cueing for hand placement. Seated at sink level in wc, patient completed oral hygiene and grooming tasks with set-up A. Session concluded with patient seated in recliner with call bell within reach, belt alarm activated, and all needs met.   Therapy Documentation Precautions:   Precautions Precautions: Posterior Hip Precaution Booklet Issued: Yes (comment) Precaution Comments: Unable to recall.  All THP reviewed Restrictions Weight Bearing Restrictions: No RLE Weight Bearing: Weight bearing as tolerated  Therapy/Group: Individual Therapy  Krosby Ritchie R [place]Davis 11/03/2019, 7:46 AM

## 2019-11-03 NOTE — Progress Notes (Signed)
Physical Therapy Session Note  Patient Details  Name: Stephanie Cortez MRN: [id number] Date of Birth: 1933/10/29  Today's Date: 11/03/2019 PT Individual Time: 1000-1057 PT Individual Time Calculation (min): 57 min   Short Term Goals: Week 1:  PT Short Term Goal 1 (Week 1): Pt will transfer bed<>chair w/ min assist PT Short Term Goal 2 (Week 1): Pt will ambulate 25' w/ min assist w/ LRAD PT Short Term Goal 3 (Week 1): Pt will initiate stair training  Skilled Therapeutic Interventions/Progress Updates:   Received pt sitting in recliner, pt agreeable to therapy, and reported pain 8/10 in R hip, but declined any pain medication stating "I have a high pain tolerance." Therapist educated pt on importance of taking pain medicine if in pain and pt agreeable to taking meds if pain worsens. Session focused on functional mobility/transfers, LE strength, dynamic standing balance/coordinaiton, ambulation, and improved endurance with activity. At rest, O2 sat 85% increasing to 95% on RA with pursed lip breathing techniques. Pt transferred recliner<>WC stand<>pivot with RW mod A. Pt performed WC mobility 191ft using bilateral UEs, supervision, and increased time to dayroom. O2 sat 85% increasing to 96% on RA with 4 minute seated rest break and pursed lip breathing. Pt ambulated 15ft with RW min/mod A with significantly increased time. Pt with decreased stride length, decreased weight shifting to R, and decreased bilateral foot clearance. Ambulation limited by pain during session. Therapist strongly encouraged pt to take pain medication and finally pt agreed. Pt required multiple rest breaks throughout session due to increased fatigue and poor activity tolerance. Pt transported back to room in [facility] total assist. Pt transferred WC<>toilet with bedside commode over top with mod A and use of grab bars. Pt required max A to doff pants/brief and able to void. Pt transferred sit<>stand mod A, required total assist for peri-care,  and max A to pull pants over hips. Pt transferred WC<>bed stand<>pivot with RW mod A. Pt transferred sit<>supine with mod A for LE management. Pt moaning in pain and again therapist educated pt on importance of receiving pain medication in order to fully participate in therapies; pt agreed. RN made aware of pt's pain levels. Concluded session with pt supine in bed, needs within reach, and bed alarm on. Therapist assisted with repositioning in bed for comfort.   Therapy Documentation Precautions:  Precautions Precautions: Posterior Hip Precaution Booklet Issued: Yes (comment) Precaution Comments: Unable to recall.  All THP reviewed Restrictions Weight Bearing Restrictions: No RLE Weight Bearing: Weight bearing as tolerated  Therapy/Group: Individual Therapy Alfonse Alpers PT, DPT   11/03/2019, 7:24 AM

## 2019-11-03 NOTE — Progress Notes (Signed)
Physical Therapy Session Note  Patient Details  Name: Stephanie Cortez MRN: [id number] Date of Birth: 1934/05/10  Today's Date: 11/03/2019 PT Individual Time: 0935-1000 PT Individual Time Calculation (min): 25 min   Short Term Goals: Week 1:  PT Short Term Goal 1 (Week 1): Pt will transfer bed<>chair w/ min assist PT Short Term Goal 2 (Week 1): Pt will ambulate 25' w/ min assist w/ LRAD PT Short Term Goal 3 (Week 1): Pt will initiate stair training  Skilled Therapeutic Interventions/Progress Updates:    Pt received seated in recliner in room, agreeable to therapy make up session. Pt reports 6/10 pain in R hip at rest that increases to 9/10 with mobility, declines any intervention at this time. Session focus on long-sitting BLE ROM and strengthening therex while seated in recliner, AAROM for RLE: heel slides, hip abd, SAQ x 10 reps each. Pt on room air at rest and with activity, SpO2 remains at 94% and above with activity. Pt left seated in recliner in room with needs in reach, quick release belt and chair alarm in place at end of session.  Therapy Documentation Precautions:  Precautions Precautions: Posterior Hip Precaution Booklet Issued: Yes (comment) Precaution Comments: Unable to recall.  All THP reviewed Restrictions Weight Bearing Restrictions: No RLE Weight Bearing: Weight bearing as tolerated    Therapy/Group: Individual Therapy   Excell Seltzer, PT, DPT  11/03/2019, 10:06 AM

## 2019-11-03 NOTE — Progress Notes (Signed)
Occupational Therapy Session Note  Patient Details  Name: Stephanie Cortez MRN: [id number] Date of Birth: May 31, 1934  Today's Date: 11/03/2019 OT Individual Time: 1402-1502 OT Individual Time Calculation (min): 60 min   Short Term Goals: Week 1:  OT Short Term Goal 1 (Week 1): Pt will thread BLE into pants wiht AE PRN and MIN VC OT Short Term Goal 2 (Week 1): Pt will don socks with AE PRN and MIN VC OT Short Term Goal 3 (Week 1): Pt will complete CM for toileting with MIN A for standing balnace OT Short Term Goal 4 (Week 1): Pt will complete toilet transfer wiht MIN A  Skilled Therapeutic Interventions/Progress Updates:    Pt greeted semi-reclined in bed with daughter present and needing to go to the bathroom. Pt completed bed mobility with HOB elevated and Min A to bring R LE towards EOB. Pt completed sit<>stand from EOB with min A and RW. Pt pivoted to [facility] with RW and Min A. Pt able to manage clothing with min A for balance while removing unilateral UE from the RW. Pt sat and voided bladder. Worked on toileting strategies with min A for balance while washing peri-area and buttocks. Pt needed verbal cues to take standing rest break and focus on breathing as she began getting SOB. Pt needed OT assist to reach pants from the floor and don new brief. Min A to then pivot over to wc. Pt washed hands and brushed hair from wc at the sink with set-up A. Pt brought down to therapy gym for UB there-ex. Pt completed 3 minutes x3 on [employer] arm bike. SpO2 checked witth activity at 96% on room air. Pt needed 2 minute rest breaks in between 3 minute sets. Pt completed 3 sets of 10 wc pushups with rest breaks in between. Pt returned to room at end of session and left seated in wc with ice on R hip, alarm belt on, and needs met.     Therapy Documentation Precautions:  Precautions Precautions: Posterior Hip Precaution Booklet Issued: Yes (comment) Precaution Comments: Unable to recall.  All THP  reviewed Restrictions Weight Bearing Restrictions: No RLE Weight Bearing: Weight bearing as tolerated Pain: Pain Assessment Pain Scale: 0-10 Pain Score: 8  Pain Type: Acute pain Pain Location: Hip Pain Orientation: Right Pain Descriptors / Indicators: Pressure Pain Frequency: Constant Pain Onset: On-going Patients Stated Pain Goal: 2 Pain Intervention(s): MD notified (Comment);Medication (See eMAR)   Therapy/Group: Individual Therapy  Valma Cava 11/03/2019, 2:35 PM

## 2019-11-03 NOTE — Progress Notes (Signed)
[place] PHYSICAL MEDICINE & REHABILITATION PROGRESS NOTE  Subjective/Complaints: Patient seen sitting up in bed this morning.  Grandson at bedside.  Grandson with questions regarding medications and pain.  Patient appears more somnolent this AM.  Yesterday, she was noted to have a vasovagal episode, cardiology and pulmonology consulted-notes reviewed, no antibiotics at this time.  ROS: Denies CP, SOB, N/V/D  Objective: Vital Signs: Blood pressure (!) 121/47, pulse 78, temperature 98.3 F (36.8 C), temperature source Oral, resp. rate 16, height 5\' 6"  (1.676 m), weight 84.6 kg, SpO2 (!) 88 %. DG Chest 2 View  Result Date: 11/02/2019 CLINICAL DATA:  Leukocytosis EXAM: CHEST - 2 VIEW COMPARISON:  October 29, 2019 FINDINGS: There remains airspace opacity with consolidation in portions of the left upper lobe. There is a calcified granuloma in this area as well. There is more ill-defined opacity in the right upper lobe and left base regions. There is cardiomegaly with pulmonary vascularity normal. No adenopathy. There is aortic atherosclerosis. There is a prior fracture of the proximal left humerus, unchanged. IMPRESSION: Multifocal pneumonia with consolidation most notable in the left upper lobe, stable. Stable cardiomegaly. Aortic Atherosclerosis (ICD10-I70.0). Fracture proximal left humerus, essentially unchanged from recent prior chest radiograph. Electronically Signed   By: Lowella Grip III M.D.   On: 11/02/2019 16:24   Recent Labs    11/02/19 0604 11/03/19 0523  WBC 11.9* 10.5  HGB 11.7* 10.0*  HCT 36.6 31.8*  PLT 268 236   Recent Labs    11/02/19 0604 11/03/19 0523  NA 133* 129*  K 4.7 5.0  CL 98 97*  CO2 27 24  GLUCOSE 93 90  BUN 16 16  CREATININE 0.77 0.77  CALCIUM 9.1 8.5*    Physical Exam: BP (!) 121/47   Pulse 78   Temp 98.3 F (36.8 C) (Oral)   Resp 16   Ht 5\' 6"  (1.676 m)   Wt 84.6 kg   LMP  (LMP Unknown)   SpO2 (!) 88%   BMI 30.10 kg/m  Constitutional:  No distress . Vital signs reviewed. HENT: Normocephalic.  Atraumatic. Eyes: EOMI. No discharge. Cardiovascular: No JVD. Respiratory: Normal effort.  No stridor. GI: Non-distended. Skin: Right hip incision with serosanguineous drainage. Psych: Slowed.  Confused.  Tangential. Musc: Right hip with edema and tenderness. Neurological: Alert Motor: Motor: Left lower extremity: 4/5 hip flexion, knee extension, 4+/5 ankle dorsiflexion, unchanged  Right lower extremity: Hip flexion, knee extension 3-/5, ankle dorsiflexion 4/5, unchanged  Assessment/Plan: 1. Functional deficits secondary to right hip fracture status post right hip hemiarthroplasty which require 3+ hours per day of interdisciplinary therapy in a comprehensive inpatient rehab setting.  Physiatrist is providing close team supervision and 24 hour management of active medical problems listed below.  Physiatrist and rehab team continue to assess barriers to discharge/monitor patient progress toward functional and medical goals  Care Tool:  Bathing    Body parts bathed by patient: Front perineal area, Buttocks   Body parts bathed by helper: Buttocks, Right lower leg, Left lower leg     Bathing assist Assist Level: Moderate Assistance - Patient 50 - 74%     Upper Body Dressing/Undressing Upper body dressing   What is the patient wearing?: Pull over shirt    Upper body assist Assist Level: Supervision/Verbal cueing    Lower Body Dressing/Undressing Lower body dressing      What is the patient wearing?: Incontinence brief, Pants     Lower body assist Assist for lower body dressing: Moderate Assistance -  Patient 50 - 74%     Toileting Toileting    Toileting assist Assist for toileting: Moderate Assistance - Patient 50 - 74%     Transfers Chair/bed transfer  Transfers assist  Chair/bed transfer activity did not occur: Safety/medical concerns  Chair/bed transfer assist level: Moderate Assistance - Patient 50 -  74%     Locomotion Ambulation   Ambulation assist   Ambulation activity did not occur: Safety/medical concerns  Assist level: Moderate Assistance - Patient 50 - 74% Assistive device: Walker-rolling Max distance: 9ft   Walk 10 feet activity   Assist  Walk 10 feet activity did not occur: Safety/medical concerns  Assist level: Moderate Assistance - Patient - 50 - 74% Assistive device: Walker-rolling   Walk 50 feet activity   Assist Walk 50 feet with 2 turns activity did not occur: Safety/medical concerns         Walk 150 feet activity   Assist Walk 150 feet activity did not occur: Safety/medical concerns         Walk 10 feet on uneven surface  activity   Assist Walk 10 feet on uneven surfaces activity did not occur: Safety/medical concerns         Wheelchair     Assist Will patient use wheelchair at discharge?: (TBD) Type of Wheelchair: Manual    Wheelchair assist level: Supervision/Verbal cueing Max wheelchair distance: 55'    Wheelchair 50 feet with 2 turns activity    Assist        Assist Level: Supervision/Verbal cueing   Wheelchair 150 feet activity     Assist     Assist Level: Maximal Assistance - Patient 25 - 49%      Medical Problem List and Plan: 1.  Decreased endurance, dizziness with activity, problems with sequencing and poor safety awareness secondary to right hip fracture s/p right hip hemiarthroplasty.  Continue CIR 2.  Antithrombotics: -DVT/anticoagulation:  Pharmaceutical: Coumadin  INR therapeutic on 5/4             -antiplatelet therapy: N/A 3. Pain Management: Continue tramadol qid with tylenol prn.  Added ice prn for local measures.   Oxycodone DC'd due to?  Cognitive side effects.  K pad added.  Controlled with meds on 5/3             Monitor with increased exertion 4. Mood: LCSW to follow for evaluation and support.              -antipsychotic agents: N/A 5. Neuropsych: This patient is capable  of making decisions on her own behalf. 6. Skin/Wound Care: Monitor wound daily for healing.  7. Fluids/Electrolytes/Nutrition: Monitor I/O.   8. Hyponatremia: Acute on chronic --baseline 130. Will monitor with strict I/Os   Sodium 129 on 5/4  Follow up again later in week 9. CAF: Monitor HR tid-on Cardizem, Zebata and coumadin.   Rate controlled on 12/4             Monitor with increased activity. 10. Chronic diastolic CHF: I/Os.  Continue irbesartan, Zebeta and pravachol.   Dose adjustments per cards [facility] Weights   10/31/19 0509 11/01/19 0557 11/02/19 0402  Weight: 84.8 kg 85.2 kg 84.6 kg   Stable on 5/4 11. Bronchiectasis/COPD: On Dulera bid             Wean supplemental daytime oxygen as tolerated 13. Reactive leucocytosis: Resolved 14. Pre-renal azotemia: Resolved 15. GERD/cricopharyngeal achalasia: Resumed PPI. Needs to be upright for meals.  16. Drug induced constipation: Added  senna to colace as had not had BM since admission.              Increase bowel meds  Improving 17.  Essential hypertension  Controlled on 5/4 18.  Moderate hypoalbuminemia  Supplement initiated on 5/1 19.  Transaminitis  LFTs elevated on 5/1  Continue to monitor 20.  Acute blood loss anemia  Hemoglobin up to 10.0 on 5/4, labs ordered for tomorrow 21.  Vasovagal episode  See #10  No further work-up per pulmonology after review of chest x-ray -follow-up as outpatient.  LOS: 4 days A FACE TO FACE EVALUATION WAS PERFORMED  Malaiah Viramontes [name] 11/03/2019, 11:14 AM

## 2019-11-03 NOTE — Plan of Care (Signed)
  Problem: Consults Goal: RH GENERAL PATIENT EDUCATION Description: See Patient Education module for education specifics. Outcome: Progressing   Problem: RH BLADDER ELIMINATION Goal: RH STG MANAGE BLADDER WITH ASSISTANCE Description: STG Manage Bladder With Min Assistance Outcome: Progressing   Problem: RH SKIN INTEGRITY Goal: RH STG SKIN FREE OF INFECTION/BREAKDOWN Description: No new breakdown with min assist  Outcome: Progressing Goal: RH STG ABLE TO PERFORM INCISION/WOUND CARE W/ASSISTANCE Description: STG Able To Perform Incision/Wound Care With [employer]. Outcome: Progressing   Problem: RH SAFETY Goal: RH STG ADHERE TO SAFETY PRECAUTIONS W/ASSISTANCE/DEVICE Description: STG Adhere to Safety Precautions With Min Assistance/Device. Outcome: Progressing   Problem: RH PAIN MANAGEMENT Goal: RH STG PAIN MANAGED AT OR BELOW PT'S PAIN GOAL Description: < 3 out of 10.  Outcome: Progressing   Problem: RH BOWEL ELIMINATION Goal: RH STG MANAGE BOWEL WITH ASSISTANCE Description: STG Manage Bowel with min Assistance. Outcome: Progressing Goal: RH STG MANAGE BOWEL W/MEDICATION W/ASSISTANCE Description: STG Manage Bowel with Medication with min Assistance. Outcome: Progressing

## 2019-11-03 NOTE — Progress Notes (Signed)
ANTICOAGULATION CONSULT NOTE - Follow Up Consult  Pharmacy Consult for warfarin Indication: atrial fibrillation, s/p R THA  Allergies  Allergen Reactions  . Doxycycline     Rash on face and neck  . Flagyl [Metronidazole] Nausea And Vomiting  . Moxifloxacin Other (See Comments)     Weakness/fatigue  . Pantoprazole Sodium Rash  . Penicillins Rash    Has patient had a PCN reaction causing immediate rash, facial/tongue/throat swelling, SOB or lightheadedness with hypotension:unsure Has patient had a PCN reaction causing severe rash involving mucus membranes or skin necrosis:unsure Has patient had a PCN reaction that required hospitalization:No Has patient had a PCN reaction occurring within the last 10 years:Yes Cannot recall exact reaction due to time lapse If all of the above answers are "NO", then may proceed with Cephalosporin use.     Patient Measurements: Height: 5\' 6"  (167.6 cm) Weight: 84.6 kg (186 lb 8 oz) IBW/kg (Calculated) : 59.3  Vital Signs: Temp: 98.3 F (36.8 C) (05/04 0601) Temp Source: Oral (05/04 0601) BP: 121/47 (05/04 0824) Pulse Rate: 78 (05/04 0825)  Labs: Recent Labs    11/01/19 0533 11/01/19 0533 11/02/19 0604 11/03/19 0523  HGB 10.6*   < > 11.7* 10.0*  HCT 33.1*  --  36.6 31.8*  PLT 225  --  268 236  LABPROT 23.0*  --  19.5* 23.7*  INR 2.1*  --  1.7* 2.2*  CREATININE 0.83  --  0.77 0.77   < > = values in this interval not displayed.    Estimated Creatinine Clearance: 57.4 mL/min (by C-G formula based on SCr of 0.77 mg/dL).  Assessment: 86 year old female with past medical history significant for HF, HTN, probable ILD. Patient on warfarin PTA for hx of atrial fibrillation. Pt admitted for fall d/t hip fracture, underwent right hip arthroplasty on 4/28. Pharmacy consulted 10/30/19 to resume warfarin dosing post-op.  Home dose: Warfarin 1.25 mg on MWF and 2.5 mg all other days    11/03/2019 - INR 2.2.  No bridge per Dr. Naaman Plummer. We will  resume home dose starting tomorrow.  - CBC: Hgb (11.7) low/stable with slight improvement today,  stable post-op. Plt WNL & stable - No documented bleeding issues - No significant DDI (on levothyroxine) - Eating ~50-75% of meals. Albumin 2.3, down from admission.  Goal of Therapy:  INR 2-3  Plan:  - Warfarin 2mg  x1 then 2.5mg  PO qday except 1.25mg  MWF - Daily INR, CBC - Monitor for signs and symptoms of bleed   Onnie Boer, PharmD, BCIDP, AAHIVP, CPP Infectious Disease Pharmacist 11/03/2019 10:30 AM

## 2019-11-04 ENCOUNTER — Inpatient Hospital Stay (HOSPITAL_COMMUNITY): Payer: Medicare Other

## 2019-11-04 ENCOUNTER — Inpatient Hospital Stay (HOSPITAL_COMMUNITY): Payer: Medicare Other | Admitting: Occupational Therapy

## 2019-11-04 ENCOUNTER — Inpatient Hospital Stay (HOSPITAL_COMMUNITY): Payer: Medicare Other | Admitting: Physical Therapy

## 2019-11-04 LAB — CBC
HCT: 34.3 % — ABNORMAL LOW (ref 36.0–46.0)
Hemoglobin: 10.9 g/dL — ABNORMAL LOW (ref 12.0–15.0)
MCH: 30.5 pg (ref 26.0–34.0)
MCHC: 31.8 g/dL (ref 30.0–36.0)
MCV: 96.1 fL (ref 80.0–100.0)
Platelets: 292 10*3/uL (ref 150–400)
RBC: 3.57 MIL/uL — ABNORMAL LOW (ref 3.87–5.11)
RDW: 13.4 % (ref 11.5–15.5)
WBC: 8.6 10*3/uL (ref 4.0–10.5)
nRBC: 0 % (ref 0.0–0.2)

## 2019-11-04 LAB — BASIC METABOLIC PANEL
Anion gap: 8 (ref 5–15)
BUN: 10 mg/dL (ref 8–23)
CO2: 26 mmol/L (ref 22–32)
Calcium: 8.9 mg/dL (ref 8.9–10.3)
Chloride: 98 mmol/L (ref 98–111)
Creatinine, Ser: 0.63 mg/dL (ref 0.44–1.00)
GFR calc Af Amer: >60 mL/min (ref >60)
GFR calc non Af Amer: >60 mL/min (ref >60)
Glucose, Bld: 93 mg/dL (ref 70–99)
Potassium: 4.5 mmol/L (ref 3.5–5.1)
Sodium: 132 mmol/L — ABNORMAL LOW (ref 135–145)

## 2019-11-04 LAB — PROTIME-INR
INR: 2.5 — ABNORMAL HIGH (ref 0.8–1.2)
Prothrombin Time: 25.8 seconds — ABNORMAL HIGH (ref 11.4–15.2)

## 2019-11-04 NOTE — Progress Notes (Signed)
Physical Therapy Session Note  Patient Details  Name: Stephanie Cortez MRN: [id number] Date of Birth: 1934-02-25  Today's Date: 11/04/2019 PT Individual Time: 1305-1420 PT Individual Time Calculation (min): 75 min   Short Term Goals: Week 1:  PT Short Term Goal 1 (Week 1): Pt will transfer bed<>chair w/ min assist PT Short Term Goal 2 (Week 1): Pt will ambulate 25' w/ min assist w/ LRAD PT Short Term Goal 3 (Week 1): Pt will initiate stair training  Skilled Therapeutic Interventions/Progress Updates: Pt presented in w/c with family and LSW present agreeable to therapy. Pt states no pain at rest. Answered queries from [facility] regarding DME with family expressing current understanding that will have more understanding of DME once closer to d/c. Pt transported to rehab gym for energy conservation and performed stand pivot transfer to high/low mat with minA for STS and CGA for transfer. Performed sit to supine with modA for RLE management. Participated in supine therex as follows for RLE strengthening. Ankle pumps, QS, GS x 20 bilaterally, AAROM to AROM heel slides, hip abd/add, SAQ, and SLR 2 x 10 RLE. After SLR performed long axis distraction grade I for pain management. Performed supine to sit at [facility] modA and performed LAQ 2 x 10 AROM. Participated in gait training x 70f to w/c with CGA and step through pattern. Pt with decreased heel strike and shortened antalgic step on R. Pt transported back to room and ambulated additional x [id number] to bed. Pt required mod A sit to supine for RLE management but was able to perform x 2 bridges with lateral shifts to center of bed. Pt requesting to lay sidelying for relief on R hip. Pt rolled to L with PTA placing pillow between legs to maintain precautions and pillow placed behind pt for support. Pt positioned to comfort and left with bed alarm on, call bell within reach and needs met.   SpO2 checked during therex maintained >94%, after ambulation 91% with near  immediate recovery to mid 90%s on RA.      Therapy Documentation Precautions:  Precautions Precautions: Posterior Hip Precaution Booklet Issued: Yes (comment) Precaution Comments: Unable to recall.  All THP reviewed Restrictions Weight Bearing Restrictions: No RLE Weight Bearing: Weight bearing as tolerated General:   Vital Signs:  Pain:   Mobility:   Locomotion :    Trunk/Postural Assessment :    Balance:   Exercises:   Other Treatments:      Therapy/Group: Individual Therapy  Cara Thaxton 11/04/2019, 2:49 PM

## 2019-11-04 NOTE — Plan of Care (Signed)
  Problem: Consults Goal: RH GENERAL PATIENT EDUCATION Description: See Patient Education module for education specifics. Outcome: Progressing   Problem: RH BLADDER ELIMINATION Goal: RH STG MANAGE BLADDER WITH ASSISTANCE Description: STG Manage Bladder With Min Assistance Outcome: Progressing   Problem: RH SKIN INTEGRITY Goal: RH STG SKIN FREE OF INFECTION/BREAKDOWN Description: No new breakdown with min assist  Outcome: Progressing Goal: RH STG ABLE TO PERFORM INCISION/WOUND CARE W/ASSISTANCE Description: STG Able To Perform Incision/Wound Care With [employer]. Outcome: Progressing   Problem: RH SAFETY Goal: RH STG ADHERE TO SAFETY PRECAUTIONS W/ASSISTANCE/DEVICE Description: STG Adhere to Safety Precautions With Min Assistance/Device. Outcome: Progressing   Problem: RH PAIN MANAGEMENT Goal: RH STG PAIN MANAGED AT OR BELOW PT'S PAIN GOAL Description: < 3 out of 10.  Outcome: Progressing   Problem: RH BOWEL ELIMINATION Goal: RH STG MANAGE BOWEL WITH ASSISTANCE Description: STG Manage Bowel with min Assistance. Outcome: Progressing Goal: RH STG MANAGE BOWEL W/MEDICATION W/ASSISTANCE Description: STG Manage Bowel with Medication with min Assistance. Outcome: Progressing

## 2019-11-04 NOTE — Progress Notes (Signed)
Physical Therapy Session Note  Patient Details  Name: Stephanie Cortez MRN: [id number] Date of Birth: 1934-03-22  Today's Date: 11/04/2019 PT Individual Time: 0900-0955 PT Individual Time Calculation (min): 55 min   Short Term Goals: Week 1:  PT Short Term Goal 1 (Week 1): Pt will transfer bed<>chair w/ min assist PT Short Term Goal 2 (Week 1): Pt will ambulate 25' w/ min assist w/ LRAD PT Short Term Goal 3 (Week 1): Pt will initiate stair training  Skilled Therapeutic Interventions/Progress Updates:   Received pt supine in bed, pt agreeable to therapy, and reported pain 9/10 in R hip along incision. RN notified and administered pain medication. Repositioning and distraction done to reduce pain levels. Session focused on functional mobility/transfers, LE strength, dynamic standing balance/coordination, ambulation, and improved activity tolerance. Pt continues to require increased time with all mobility due to pain and poor endurance. Pt transferred supine<>sitting EOB with min A and use of bedrails. Pt transferred bed<>WC stand<>pivot with RW mod A. Pt brushed teeth and combed hair at sink with supervision while seated in WC. O2 sat 96% and HR 77 bpm sitting in WC on RA. Doffed dirty shirt and donned clean one while sitting in WC with min A. Pt transported to therapy gym in [facility] total assist for time management purposes. Pt ambulated 57ft with RW min A with increased time. Pt demonstrated decreased R weight shifting, decreased bilateral stride length, step to gait pattern, and decreased trunk rotation. Pt required verbal cues for increased step length and upright posture. O2 sat 92% increasing to 100% on RA with 3 minute seated rest break and cues for pursed lip breathing techniques. Pt transferred sit<>stand with RW min A and performed alternating toe taps to 3 cones with bilateral UE support on RW CGA/min A x 2 rounds bilaterally. Pt declined performing activity again due to increased pain. Pt required  verbal cues to maintain R posterior hip precautions when sitting/standning as well as cues for hand placement with pushing up and sitting down from WC. Pt transported back to room in [facility] total assist. Concluded session with pt sitting in WC, needs within reach, and seatbelt alarm on.   Therapy Documentation Precautions:  Precautions Precautions: Posterior Hip Precaution Booklet Issued: Yes (comment) Precaution Comments: Unable to recall.  All THP reviewed Restrictions Weight Bearing Restrictions: No RLE Weight Bearing: Weight bearing as tolerated  Therapy/Group: Individual Therapy Alfonse Alpers PT, DPT   11/04/2019, 7:25 AM

## 2019-11-04 NOTE — Patient Care Conference (Signed)
Inpatient RehabilitationTeam Conference and Plan of Care Update Date: 11/04/2019   Time: 11:40 AM    Patient Name: ALAIIA Cortez      Medical Record Number: [id number]  Date of Birth: 1934/04/01 Sex: Female         Room/Bed: 4W26C/4W26C-01 Payor Info: Payor: MEDICARE / Plan: MEDICARE PART A AND B / Product Type: *No Product type* /    Admit Date/Time:  10/30/2019  3:56 PM  Primary Diagnosis:  Subcapital fracture of femur [facility])  Patient Active Problem List   Diagnosis Date Noted  . Vasovagal episode   . Chronic anticoagulation   . Acute blood loss anemia   . Transaminitis   . Hypoalbuminemia due to protein-calorie malnutrition ([place])   . Labile blood pressure   . Drug induced constipation   . Supplemental oxygen dependent   . Postoperative pain   . Subcapital fracture of femur ([place]) 10/30/2019  . Post-operative state   . Prerenal azotemia   . Leukocytosis   . Closed right hip fracture, initial encounter ([place]) 10/27/2019  . Hoarseness 02/19/2018  . Sensorineural hearing loss (SNHL) of both ears 02/19/2018  . Tinnitus, bilateral 02/19/2018  . Cough 09/06/2017  . Cough variant asthma 08/19/2017  . Acute bronchitis 02/20/2016  . Pulmonary air trapping 01/04/2016  . Encounter for preoperative pulmonary examination 01/04/2016  . NSIP (nonspecific interstitial pneumonia) ([place]) 09/26/2015  . Chronic sinusitis 09/26/2015  . ILD (interstitial lung disease) ([place]) 07/12/2014  . Upper airway cough syndrome 09/23/2013  . Encounter for therapeutic drug monitoring 08/11/2013  . Congestive heart failure ([place]) 07/08/2013  . Coronary artery calcification seen on CAT scan 03/15/2013  . Nodule of left lung 03/15/2013  . Orthostatic lightheadedness 02/09/2013  . Situational mixed anxiety and depressive disorder   . Hyponatremia 04/03/2012  . Diverticulitis 01/01/2012  . GERD with stricture 01/01/2012  . Hypothyroid 11/23/2010  . Edema 10/04/2010  . Bronchiectasis ([place])   . HYPERTENSION,  BENIGN 10/22/2008  . A-fib [facility])     Expected Discharge Date: Expected Discharge Date: 11/14/19  Team Members Present: Physician leading conference: Dr. Delice Lesch Care Coodinator Present: Erlene Quan, BSW;Deborah Tobin Chad, RN, BSN, [place] Nurse Present: Vernie Murders, RN PT Present: Becky Sax, PT OT Present: Simonne Come, OT SLP Present: Weston Anna, SLP PPS Coordinator present : Ileana Ladd, Burna Mortimer, SLP     Current Status/Progress Goal Weekly Team Focus  Bowel/Bladder   Continent of B/B LBM 05/03  remain continent of B/B with normal bowel pattern  toilet prn laxatives prn   Swallow/Nutrition/ Hydration             ADL's   Min/Mod A overall  Supervision  self-care retraining, activity tolerance, general strengthening, transfers, pain management   Mobility   bed mobility min A, transfers mod A with RW, gait 35ft with RW mod A  supervision, min A for stairs  functional mobility/transfers, LE strength, dynamic standing balance/coordination, ambulation, stair navigation, endurance   Communication             Safety/Cognition/ Behavioral Observations            Pain   Right hip pain well controlled with tramadol / tylenol/  Robaxin prn  free of pain  assess for pain qshift and prn   Skin   Ecchymosis to bilateral arms and to right hip, right hip surgical site with staples  pt remains free of infection skin remains intact without any breakdown  Assess skin qshift and prn, dressing changes as  ordered    Rehab Goals Patient on target to meet rehab goals: Yes Rehab Goals Revised: Patient currently being evaluated will update. Goals: MOD I/Supervision *See Care Plan and progress notes for long and short-term goals.     Barriers to Discharge  Current Status/Progress Possible Resolutions Date Resolved   Nursing                  PT  Other (comments);Inaccessible home environment  2 STE with 0 rails, pain, difficulty maintaining precautions              OT                   SLP                SW     Currently being evaluated.          Discharge Planning/Teaching Needs:  Patient and daughter goal to discharge home with [facility].  Daughter requesting family education closer to discharge.   Team Discussion: MD pain controlled, processing delayed today, checking labs, low Na+, pain meds adjusted, had a vasovagal episode.  RN robaxin for pain, O2 2L at night, staples R hip, hip drainage present.  OT min a transfers.  PT min/mod a transfers, amb 40' min A walker.   Revisions to Treatment Plan:      Medical Summary Current Status: Decreased endurance, dizziness with activity, problems with sequencing and poor safety awareness secondary to right hip fracture s/p right hip hemiarthroplasty Weekly Focus/Goal: Improve mobility, transaminitis, HTN, CHF, hyponatremia, pain, cognition  Barriers to Discharge: Medical stability;Wound care;Other (comments)  Barriers to Discharge Comments: Cognition Possible Resolutions to Barriers: Therapies, follow labs, follow weights, balance pain meds with cognition   Continued Need for Acute Rehabilitation Level of Care: The patient requires daily medical management by a physician with specialized training in physical medicine and rehabilitation for the following reasons: Direction of a multidisciplinary physical rehabilitation program to maximize functional independence : Yes Medical management of patient stability for increased activity during participation in an intensive rehabilitation regime.: Yes Analysis of laboratory values and/or radiology reports with any subsequent need for medication adjustment and/or medical intervention. : Yes   I attest that I was present, lead the team conference, and concur with the assessment and plan of the team.   Retta Diones 11/06/2019, 10:25 AM   Team conference was held via web/ teleconference due to [place] - 19

## 2019-11-04 NOTE — Progress Notes (Signed)
Occupational Therapy Session Note  Patient Details  Name: Stephanie Cortez MRN: [id number] Date of Birth: 05/29/1934  Today's Date: 11/04/2019 OT Individual Time: 1030-1130 OT Individual Time Calculation (min): 60 min    Short Term Goals: Week 1:  OT Short Term Goal 1 (Week 1): Pt will thread BLE into pants wiht AE PRN and MIN VC OT Short Term Goal 2 (Week 1): Pt will don socks with AE PRN and MIN VC OT Short Term Goal 3 (Week 1): Pt will complete CM for toileting with MIN A for standing balnace OT Short Term Goal 4 (Week 1): Pt will complete toilet transfer wiht MIN A  Skilled Therapeutic Interventions/Progress Updates:    Treatment session with focus on self-care retraining with bathing and dressing and toilet transfers.  Pt received upright in w/c more alert and engaged this session.  Pt agreeable to bathing/dressing at sink side, asking questions about when she could get in the shower - need to follow up with MD as initial order stated no shower.  Pt completed bathing with min assist for sit > stand and when washing RLE due to hip precautions.  Pt completed sit > stand throughout bathing and dressing with increased time and min assist.  Pt reports need to toilet.  Completed stand pivot transfer min assist with mod assist for toileting tasks.  Returned to sinkside to finish dressing. Educated on use of reacher for LB dressing, requiring min assist to thread RLE.  Remained upright in w/c with seat belt alarm on and all needs in reach.  Daughter arriving at end of session.  Therapy Documentation Precautions:  Precautions Precautions: Posterior Hip Precaution Booklet Issued: Yes (comment) Precaution Comments: Unable to recall.  All THP reviewed Restrictions Weight Bearing Restrictions: No RLE Weight Bearing: Weight bearing as tolerated General:   Vital Signs: Therapy Vitals Temp: 97.7 F (36.5 C) Pulse Rate: (!) 56 Resp: 17 BP: (!) 115/52 Patient Position (if appropriate):  Lying Oxygen Therapy SpO2: 97 % O2 Device: Room Air Pain:   Pt with no c/o pain  Therapy/Group: Individual Therapy  Simonne Come 11/04/2019, 3:47 PM

## 2019-11-04 NOTE — Progress Notes (Addendum)
[facility] PHYSICAL MEDICINE & REHABILITATION PROGRESS NOTE  Subjective/Complaints: Patient seen sitting up in bed this morning.  She states she slept well overnight.  She remains slowed.  ROS: Denies CP, SOB, N/V/D  Objective: Vital Signs: Blood pressure (!) 142/64, pulse 63, temperature 97.6 F (36.4 C), resp. rate 18, height 5\' 6"  (1.676 m), weight 81.3 kg, SpO2 99 %. DG Chest 2 View  Result Date: 11/02/2019 CLINICAL DATA:  Leukocytosis EXAM: CHEST - 2 VIEW COMPARISON:  October 29, 2019 FINDINGS: There remains airspace opacity with consolidation in portions of the left upper lobe. There is a calcified granuloma in this area as well. There is more ill-defined opacity in the right upper lobe and left base regions. There is cardiomegaly with pulmonary vascularity normal. No adenopathy. There is aortic atherosclerosis. There is a prior fracture of the proximal left humerus, unchanged. IMPRESSION: Multifocal pneumonia with consolidation most notable in the left upper lobe, stable. Stable cardiomegaly. Aortic Atherosclerosis (ICD10-I70.0). Fracture proximal left humerus, essentially unchanged from recent prior chest radiograph. Electronically Signed   By: Lowella Grip III M.D.   On: 11/02/2019 16:24   Recent Labs    11/03/19 0523 11/04/19 0656  WBC 10.5 8.6  HGB 10.0* 10.9*  HCT 31.8* 34.3*  PLT 236 292   Recent Labs    11/03/19 0523 11/04/19 0656  NA 129* 132*  K 5.0 4.5  CL 97* 98  CO2 24 26  GLUCOSE 90 93  BUN 16 10  CREATININE 0.77 0.63  CALCIUM 8.5* 8.9    Physical Exam: BP (!) 142/64 (BP Location: Left Arm)   Pulse 63   Temp 97.6 F (36.4 C)   Resp 18   Ht 5\' 6"  (1.676 m)   Wt 81.3 kg   LMP  (LMP Unknown)   SpO2 99%   BMI 28.93 kg/m  Constitutional: No distress . Vital signs reviewed. HENT: Normocephalic.  Atraumatic. Eyes: EOMI. No discharge. Cardiovascular: No JVD. Respiratory: Normal effort.  No stridor. +[place]. GI: Non-distended. Skin: Right hip incision  with dressing C/D/I  Psych: Slowed.  Confused. Musc: Right hip with edema and tenderness. Neurological: Alert Motor: Motor: Left lower extremity: 4/5 hip flexion, knee extension, 4+/5 ankle dorsiflexion Right lower extremity: Hip flexion, knee extension 2+/5, ankle dorsiflexion 4/5  Assessment/Plan: 1. Functional deficits secondary to right hip fracture status post right hip hemiarthroplasty which require 3+ hours per day of interdisciplinary therapy in a comprehensive inpatient rehab setting.  Physiatrist is providing close team supervision and 24 hour management of active medical problems listed below.  Physiatrist and rehab team continue to assess barriers to discharge/monitor patient progress toward functional and medical goals  Care Tool:  Bathing    Body parts bathed by patient: Front perineal area, Buttocks   Body parts bathed by helper: Buttocks, Right lower leg, Left lower leg     Bathing assist Assist Level: Moderate Assistance - Patient 50 - 74%     Upper Body Dressing/Undressing Upper body dressing   What is the patient wearing?: Pull over shirt    Upper body assist Assist Level: Supervision/Verbal cueing    Lower Body Dressing/Undressing Lower body dressing      What is the patient wearing?: Incontinence brief, Pants     Lower body assist Assist for lower body dressing: Moderate Assistance - Patient 50 - 74%     Toileting Toileting    Toileting assist Assist for toileting: Moderate Assistance - Patient 50 - 74%     Transfers Chair/bed transfer  Transfers assist  Chair/bed transfer activity did not occur: Safety/medical concerns  Chair/bed transfer assist level: Moderate Assistance - Patient 50 - 74%     Locomotion Ambulation   Ambulation assist   Ambulation activity did not occur: Safety/medical concerns  Assist level: Minimal Assistance - Patient > 75% Assistive device: Walker-rolling Max distance: 36ft   Walk 10 feet  activity   Assist  Walk 10 feet activity did not occur: Safety/medical concerns  Assist level: Minimal Assistance - Patient > 75% Assistive device: Walker-rolling   Walk 50 feet activity   Assist Walk 50 feet with 2 turns activity did not occur: Safety/medical concerns         Walk 150 feet activity   Assist Walk 150 feet activity did not occur: Safety/medical concerns         Walk 10 feet on uneven surface  activity   Assist Walk 10 feet on uneven surfaces activity did not occur: Safety/medical concerns         Wheelchair     Assist Will patient use wheelchair at discharge?: Yes Type of Wheelchair: Manual    Wheelchair assist level: Supervision/Verbal cueing Max wheelchair distance: 150    Wheelchair 50 feet with 2 turns activity    Assist        Assist Level: Supervision/Verbal cueing   Wheelchair 150 feet activity     Assist     Assist Level: Supervision/Verbal cueing      Medical Problem List and Plan: 1.  Decreased endurance, dizziness with activity, problems with sequencing and poor safety awareness secondary to right hip fracture s/p right hip hemiarthroplasty.  Continue CIR  Team conference today to discuss current and goals and coordination of care, home and environmental barriers, and discharge planning with nursing, case manager, and therapies.  2.  Antithrombotics: -DVT/anticoagulation:  Pharmaceutical: Coumadin, goal 2-3  INR therapeutic on 5/5             -antiplatelet therapy: N/A 3. Pain Management: Continue tramadol qid with tylenol prn.  Added ice prn for local measures.   Oxycodone DC'd due to?  Cognitive side effects.  K pad added.  Appears controlled with meds/interventions on 5/5             Monitor with increased exertion 4. Mood: LCSW to follow for evaluation and support.              -antipsychotic agents: N/A 5. Neuropsych: This patient is ?fully capable of making decisions on her own behalf.  Will  order TSH with next labs 6. Skin/Wound Care: Monitor wound daily for healing.  7. Fluids/Electrolytes/Nutrition: Monitor I/O.   8. Hyponatremia: Acute on chronic --baseline 130. Will monitor with strict I/Os   Sodium 132 on 5/5  Follow up again later in week 9. CAF: Monitor HR tid-on Cardizem, Zebata and coumadin.   Rate controlled on 5/5             Monitor with increased activity. 10. Chronic diastolic CHF: I/Os.  Continue irbesartan, Zebeta and pravachol.   Dose adjustments per cards [facility] Weights   11/01/19 0557 11/02/19 0402 11/04/19 0438  Weight: 85.2 kg 84.6 kg 81.3 kg   Questionable reliability on 5/5 11. Bronchiectasis/COPD: On Dulera bid             Wean supplemental daytime oxygen as tolerated 13. Reactive leucocytosis: Resolved 14. Pre-renal azotemia: Resolved 15. GERD/cricopharyngeal achalasia: Resumed PPI. Needs to be upright for meals.  16. Drug induced constipation: Added senna to  colace as had not had BM since admission.              Increase bowel meds  Improving 17.  Essential hypertension  Controlled on 5/5 18.  Moderate hypoalbuminemia  Supplement initiated on 5/1 19.  Transaminitis  LFTs elevated on 5/1, will order labs later in the week  Continue to monitor 20.  Acute blood loss anemia  Hemoglobin up to 10.9 on 5/5 21.  Vasovagal episode  See #10  No further work-up per pulmonology after review of chest x-ray -follow-up as outpatient.  LOS: 5 days A FACE TO FACE EVALUATION WAS PERFORMED  Naszir Cott [name] 11/04/2019, 10:10 AM

## 2019-11-04 NOTE — Progress Notes (Signed)
Social Work Patient ID: Stephanie Cortez, female   DOB: May 14, 1934, 86 y.o.   MRN: [id number]  SW entered room to provided team conference update. Daughter and spouse present. Patient sitting in chair having lunch. SW informed patient/family of MD, nursing and therapy updates. Patient actively engaging in discharge planning and asking questions. Follow up PT and OT recommended. Therapy will recommend DME closer to discharge date. Therapy entered room to begin PT session. SW will follow up with family questions or concerns.

## 2019-11-04 NOTE — Progress Notes (Signed)
ANTICOAGULATION CONSULT NOTE - Follow Up Consult  Pharmacy Consult for warfarin Indication: atrial fibrillation, s/p R THA  Allergies  Allergen Reactions  . Doxycycline     Rash on face and neck  . Flagyl [Metronidazole] Nausea And Vomiting  . Moxifloxacin Other (See Comments)     Weakness/fatigue  . Pantoprazole Sodium Rash  . Penicillins Rash    Has patient had a PCN reaction causing immediate rash, facial/tongue/throat swelling, SOB or lightheadedness with hypotension:unsure Has patient had a PCN reaction causing severe rash involving mucus membranes or skin necrosis:unsure Has patient had a PCN reaction that required hospitalization:No Has patient had a PCN reaction occurring within the last 10 years:Yes Cannot recall exact reaction due to time lapse If all of the above answers are "NO", then may proceed with Cephalosporin use.     Patient Measurements: Height: 5\' 6"  (167.6 cm) Weight: 81.3 kg (179 lb 3.7 oz) IBW/kg (Calculated) : 59.3  Vital Signs: Temp: 97.6 F (36.4 C) (05/05 0440) BP: 142/64 (05/05 0440) Pulse Rate: 63 (05/05 0440)  Labs: Recent Labs    11/02/19 0604 11/02/19 0604 11/03/19 0523 11/04/19 0656  HGB 11.7*   < > 10.0* 10.9*  HCT 36.6  --  31.8* 34.3*  PLT 268  --  236 292  LABPROT 19.5*  --  23.7* 25.8*  INR 1.7*  --  2.2* 2.5*  CREATININE 0.77  --  0.77 0.63   < > = values in this interval not displayed.    Estimated Creatinine Clearance: 56.3 mL/min (by C-G formula based on SCr of 0.63 mg/dL).  Assessment: 86 year old female with past medical history significant for HF, HTN, probable ILD. Patient on warfarin PTA for hx of atrial fibrillation. Pt admitted for fall d/t hip fracture, underwent right hip arthroplasty on 4/28. Pharmacy consulted 10/30/19 to resume warfarin dosing post-op.  Home dose: Warfarin 1.25 mg on MWF and 2.5 mg all other days    11/04/2019 - INR 2.5.  No bridge per Dr. Naaman Plummer. We will resume home dose starting today -  CBC: Hgb (10.9) low/stable with slight improvement today,  stable post-op. Plt WNL & stable - No documented bleeding issues - No significant DDI (on levothyroxine) - Eating ~50-75% of meals. Albumin 2.3, down from admission.  Goal of Therapy:  INR 2-3  Plan:  - Warfarin 2.5mg  PO qday except 1.25mg  MWF - Daily INR, CBC - Monitor for signs and symptoms of bleed   Onnie Boer, PharmD, BCIDP, AAHIVP, CPP Infectious Disease Pharmacist 11/04/2019 12:52 PM

## 2019-11-05 ENCOUNTER — Inpatient Hospital Stay (HOSPITAL_COMMUNITY): Payer: Medicare Other

## 2019-11-05 ENCOUNTER — Inpatient Hospital Stay (HOSPITAL_COMMUNITY): Payer: Medicare Other | Admitting: Occupational Therapy

## 2019-11-05 ENCOUNTER — Inpatient Hospital Stay (HOSPITAL_COMMUNITY): Payer: Medicare Other | Admitting: Physical Therapy

## 2019-11-05 DIAGNOSIS — S72001A Fracture of unspecified part of neck of right femur, initial encounter for closed fracture: Secondary | ICD-10-CM

## 2019-11-05 LAB — BASIC METABOLIC PANEL
Anion gap: 7 (ref 5–15)
BUN: 8 mg/dL (ref 8–23)
CO2: 27 mmol/L (ref 22–32)
Calcium: 8.9 mg/dL (ref 8.9–10.3)
Chloride: 102 mmol/L (ref 98–111)
Creatinine, Ser: 0.75 mg/dL (ref 0.44–1.00)
GFR calc Af Amer: >60 mL/min (ref >60)
GFR calc non Af Amer: >60 mL/min (ref >60)
Glucose, Bld: 102 mg/dL — ABNORMAL HIGH (ref 70–99)
Potassium: 4.7 mmol/L (ref 3.5–5.1)
Sodium: 136 mmol/L (ref 135–145)

## 2019-11-05 LAB — TSH: TSH: 2.248 u[IU]/mL (ref 0.350–4.500)

## 2019-11-05 LAB — CBC
HCT: 36.6 % (ref 36.0–46.0)
Hemoglobin: 11.5 g/dL — ABNORMAL LOW (ref 12.0–15.0)
MCH: 30 pg (ref 26.0–34.0)
MCHC: 31.4 g/dL (ref 30.0–36.0)
MCV: 95.6 fL (ref 80.0–100.0)
Platelets: 326 10*3/uL (ref 150–400)
RBC: 3.83 MIL/uL — ABNORMAL LOW (ref 3.87–5.11)
RDW: 13.4 % (ref 11.5–15.5)
WBC: 8.1 10*3/uL (ref 4.0–10.5)
nRBC: 0 % (ref 0.0–0.2)

## 2019-11-05 LAB — PROTIME-INR
INR: 2.6 — ABNORMAL HIGH (ref 0.8–1.2)
Prothrombin Time: 27.1 seconds — ABNORMAL HIGH (ref 11.4–15.2)

## 2019-11-05 MED ORDER — FLUOXETINE HCL 10 MG PO CAPS
10.0000 mg | ORAL_CAPSULE | Freq: Every day | ORAL | Status: DC
  Administered 2019-11-05 – 2019-11-15 (×11): 10 mg via ORAL
  Filled 2019-11-05 (×11): qty 1

## 2019-11-05 MED ORDER — VITAMIN B-12 1000 MCG PO TABS
1000.0000 ug | ORAL_TABLET | ORAL | Status: DC
  Administered 2019-11-06 – 2019-11-13 (×4): 1000 ug via ORAL
  Filled 2019-11-05 (×2): qty 1

## 2019-11-05 MED ORDER — SENNA 8.6 MG PO TABS
2.0000 | ORAL_TABLET | Freq: Every day | ORAL | Status: DC
  Administered 2019-11-06 – 2019-11-11 (×6): 17.2 mg via ORAL
  Filled 2019-11-05 (×9): qty 2

## 2019-11-05 MED ORDER — ACETAMINOPHEN 325 MG PO TABS: 650.0000 mg | ORAL_TABLET | Freq: Three times a day (TID) | ORAL | Status: DC

## 2019-11-05 NOTE — Progress Notes (Signed)
Occupational Therapy Session Note  Patient Details  Name: Stephanie Cortez MRN: [id number] Date of Birth: 1933/10/05  Today's Date: 11/05/2019 OT Individual Time: 0920-1000 OT Individual Time Calculation (min): 40 min    Short Term Goals: Week 1:  OT Short Term Goal 1 (Week 1): Pt will thread BLE into pants wiht AE PRN and MIN VC OT Short Term Goal 2 (Week 1): Pt will don socks with AE PRN and MIN VC OT Short Term Goal 3 (Week 1): Pt will complete CM for toileting with MIN A for standing balnace OT Short Term Goal 4 (Week 1): Pt will complete toilet transfer wiht MIN A  Skilled Therapeutic Interventions/Progress Updates:    Upon entering the room, pt seated on EOB with family present in the room but leaving shortly after therapist arrival. Pt very pleasant and agreeable to OT intervention with 4/10 R hip pain reported during session. Family reports she received medication prior to OT arrival. Pt standing with min A and ambulating with RW into bathroom for toileting needed. Pt managed clothing and hygiene with min A for standing balance. Pt exiting the bathroom, and standing at sink for hand hygiene. Pt then engaged in dressing tasks with focus on sit <>stand and maintaining precautions. Pt utilized LH reacher to thread pants onto R foot with min cuing for technique. Pt standing with min A and pulling over B hips. Pt donning UB clothing items with set up A but needing assistance to fasten bra this session. Pt remained in wheelchair at end of session with all needs within reach. Chair alarm belt donned and activated. Call bell within reach.   Therapy Documentation Precautions:  Precautions Precautions: Posterior Hip Precaution Booklet Issued: Yes (comment) Precaution Comments: Unable to recall.  All THP reviewed Restrictions Weight Bearing Restrictions: Yes RLE Weight Bearing: Weight bearing as tolerated   Pain: Pain Assessment Pain Scale: 0-10 Pain Score: 6  Pain Type: Acute  pain;Surgical pain Pain Location: Hip Pain Orientation: Right Pain Descriptors / Indicators: Aching;Throbbing Pain Onset: Progressive Patients Stated Pain Goal: 2 Pain Intervention(s): Medication (See eMAR);Repositioned ADL:   Vision   Perception    Praxis   Exercises:   Other Treatments:     Therapy/Group: Individual Therapy  Gypsy Decant 11/05/2019, 12:54 PM

## 2019-11-05 NOTE — Progress Notes (Signed)
Physical Therapy Session Note  Patient Details  Name: Stephanie Cortez MRN: [id number] Date of Birth: 27-Sep-1933  Today's Date: 11/05/2019 PT Individual Time: 1100-1157 and 1300-1343 PT Individual Time Calculation (min): 57 min and 43 min  Short Term Goals: Week 1:  PT Short Term Goal 1 (Week 1): Pt will transfer bed<>chair w/ min assist PT Short Term Goal 2 (Week 1): Pt will ambulate 25' w/ min assist w/ LRAD PT Short Term Goal 3 (Week 1): Pt will initiate stair training  Skilled Therapeutic Interventions/Progress Updates:   Treatment Session 1: [id number] 57 min Received pt sitting in WC, pt agreeable to therapy, and reported pain 10/10 in R hip along incision. RN notified and administered pain medication during session. Repositioning and distraction done to reduce pain levels. Therapist educated pt on importance of not waiting to take pain medicine until pain levels get too high; pt agreed. Session focused on functional mobility/transfers, LE strength, dynamic standing balance/coordination, ambulation, stair navigation, and improved activity tolerance. Pt reported urge to use restroom and transferred stand<>pivot WC<>toilet with bedside commode over top with min A and use of grab bars. Pt required mod A to doff pants/brief and able to void. Pt able to perform hygiene management with supervision. Pt transferred sit<>stand min A and required max A to pull pants/brief over hips. Pt transferred stand<>pivot back to WC min A. Pt washed hands seated in WC at sink with supervision. Pt transported to therapy gym in [facility] total assist for time management purposes. Pt navigated 2 steps with 1 rail mod A using a lateral stepping technique. Pt required verbal cues for stepping sequence, foot placement on step, and technique. Pt ambulated 28ft with RW min A. Pt demonstrated decreased stride length, decreased R weight shifting, and decreased bilateral foot clearance. Pt transported back to room in [facility] total assist. Pt  transferred stand<>pivot WC<>bed with RW min A and sit<>supine with min A. Concluded session with pt supine in bed, needs within reach, and bed alarm on.   Treatment Session 2: [id number] 43 min Received pt sitting on commode with NT present, PT took over with care, pt agreeable to therapy, and reported pain 5/10 in R hip along incision (premedicated). Repositioning and distraction done to reduce pain levels. Session focused on functional mobility/transfers, LE strength, dynamic standing balance/coordination, ambulation, and improved activity tolerance. Pt transferred sit<>stand min A and required mod A to pull pants/brief over hips. Pt ambulated 11ft with RW min A back to WC and washed hands seated in WC at sink with supervision. Pt's daughter present and therapist educated pt's daughter on pt's 51 and daughter confirmed that they are still planning on getting a rail installed on the stairs and that pt will need a RW. Pt transported to dayroom in [facility] total assist. Pt transferred stand<>pivot WC<>Nustep with RW min A and performed bilateral UE strengthening only (to avoid breaking posterior hip precautions) on Nustep for 5 minutes and 30 seconds at workload 1 for a total of 201 steps. Pt required extensive rest break after activity. Pt ambulated 126ft with RW min A. O2 sat 88% on RA increasing to 93% with 2 minute seated rest break. Pt transported back to room in [facility] total assist. Pt reported urge to use restroom again and ambulated 53ft with RW min A to toilet. Pt required mod A to doff pants/brief. Concluded session with pt sitting on toilet, needs within reach, and NT present attending to care.   Therapy Documentation Precautions:  Precautions Precautions: Posterior Hip  Precaution Booklet Issued: Yes (comment) Precaution Comments: Unable to recall.  All THP reviewed Restrictions Weight Bearing Restrictions: Yes RLE Weight Bearing: Weight bearing as tolerated  Therapy/Group: Individual Therapy Alfonse Alpers PT, DPT   11/05/2019, 7:36 AM

## 2019-11-05 NOTE — Progress Notes (Signed)
Physical Therapy Session Note  Patient Details  Name: Stephanie Cortez MRN: [id number] Date of Birth: 24-Jan-1934  Today's Date: 11/05/2019 PT Individual Time: [id number] PT Individual Time Calculation (min): 64 min   Short Term Goals: Week 1:  PT Short Term Goal 1 (Week 1): Pt will transfer bed<>chair w/ min assist PT Short Term Goal 2 (Week 1): Pt will ambulate 25' w/ min assist w/ LRAD PT Short Term Goal 3 (Week 1): Pt will initiate stair training  Skilled Therapeutic Interventions/Progress Updates:    Pt received L sidelying in bed and agreeable to therapy session. Sidelying>sitting EOB with CGA for steadying with HOB partially elevated. Therapist reviewed hip precautions with pt able to recall 90degrees rule but not other 2 precautions - therapist provided visual demonstration for improved recall. Sit<>stands using RW with min assist throughout session - does require elevating EOB to come to standing while maintaining hip precautions. Gait ~61ft to w/c using RW with CGA/min assist for balance.  Transported to/from gym in w/c for time management and energy conservation. Gait training ~29ft using RW with CGA/min assist for steadying - continues to demonstrate decreased B LE step length, decreased R lateral weight shift during stance (due to pain with WBing), decreased gait speed, sustained B hips and knees flexed during gait with forward trunk flexion (likely due to tight hip flexors as noted during supine stretch). Seated EOM R LE long arc quads 2x20reps with cuing for increased muscle activation. Sit>supine with visual demonstration and education on long sitting technique to maintain hip precautions with pt demonstrating understanding and able to perform with CGA and increased time. Performed the following R LE exercises:  - active assisted heel slides x10 reps - active assisted hip abduction/adduction(to neutral) x10 reps with pt demonstrating very minimal ROM due to pain - glute squeezes x5  reps - hip flexor stretch via just supine lying with R LE straight on mat Supine>sitting L EOB with mod assist for trunk upright and cuing for sequencing to maintain hip precautions. Stand pivot to w/c using RW with CGA. Transported back to room and pt requesting to return to supine. Stand pivot using RW with CGA. Sit>supine with assist for R LE management due to weak hip abductors and pt requiring cuing for recall of long sitting technique to maintain precautions. Pt left supine in bed with needs in reach and bed alarm on.  Therapy Documentation Precautions:  Precautions Precautions: Posterior Hip Precaution Booklet Issued: Yes (comment) Precaution Comments: Unable to recall.  All THP reviewed Restrictions Weight Bearing Restrictions: Yes RLE Weight Bearing: Weight bearing as tolerated   Pain:  Reports R hip pain 7/10 - provided seated rest breaks, repositioning, and distraction for pain management - medication administration.   Therapy/Group: Individual Therapy  Tawana Scale, PT, DPT  11/05/2019, 3:33 PM

## 2019-11-05 NOTE — Progress Notes (Signed)
[place] PHYSICAL MEDICINE & REHABILITATION PROGRESS NOTE  Subjective/Complaints: Patient seen sitting up in bed this AM.  She states she slept well until 12AM, however, had to urinate after that and could not get assistance.  She ultimately voided in bed and had trouble going back to sleep.  Daughter and grandson at bedside.  Patient and daughter with questions regarding pain meds and overnight issues.  Charge nurse discussing with patient. Patient remains tangential, but appears more clear this AM. Provided encouragement for patient.   ROS: Denies CP, SOB, N/V/D  Objective: Vital Signs: Blood pressure (!) 163/66, pulse 82, temperature 98.3 F (36.8 C), temperature source Oral, resp. rate 16, height 5\' 6"  (1.676 m), weight 81.3 kg, SpO2 98 %. No results found. Recent Labs    11/04/19 0656 11/05/19 0520  WBC 8.6 8.1  HGB 10.9* 11.5*  HCT 34.3* 36.6  PLT 292 326   Recent Labs    11/04/19 0656 11/05/19 0520  NA 132* 136  K 4.5 4.7  CL 98 102  CO2 26 27  GLUCOSE 93 102*  BUN 10 8  CREATININE 0.63 0.75  CALCIUM 8.9 8.9    Physical Exam: BP (!) 163/66 (BP Location: Left Arm)   Pulse 82   Temp 98.3 F (36.8 C) (Oral)   Resp 16   Ht 5\' 6"  (1.676 m)   Wt 81.3 kg   LMP  (LMP Unknown)   SpO2 98%   BMI 28.93 kg/m   Constitutional: No distress . Vital signs reviewed. HENT: Normocephalic.  Atraumatic. Eyes: EOMI. No discharge. Cardiovascular: No JVD. Respiratory: Normal effort.  No stridor. +[place].  GI: Non-distended. Skin: Right hip with incision C/D/I. Psych: Tangential.  Musc: No edema in extremities.  No tenderness in extremities. Neurological: Alert Motor: Motor: Left lower extremity: 4/5 hip flexion, knee extension, 4+/5 ankle dorsiflexion Right lower extremity: Hip flexion, knee extension 2+/5, ankle dorsiflexion 4/5, unchanged  Assessment/Plan: 1. Functional deficits secondary to right hip fracture status post right hip hemiarthroplasty which require 3+ hours  per day of interdisciplinary therapy in a comprehensive inpatient rehab setting.  Physiatrist is providing close team supervision and 24 hour management of active medical problems listed below.  Physiatrist and rehab team continue to assess barriers to discharge/monitor patient progress toward functional and medical goals  Care Tool:  Bathing    Body parts bathed by patient: Right arm, Left arm, Chest, Abdomen, Front perineal area, Buttocks, Right upper leg, Left upper leg, Left lower leg, Face   Body parts bathed by helper: Right lower leg     Bathing assist Assist Level: Minimal Assistance - Patient > 75%     Upper Body Dressing/Undressing Upper body dressing   What is the patient wearing?: Pull over shirt, Bra    Upper body assist Assist Level: Supervision/Verbal cueing    Lower Body Dressing/Undressing Lower body dressing      What is the patient wearing?: Incontinence brief, Pants     Lower body assist Assist for lower body dressing: Moderate Assistance - Patient 50 - 74%     Toileting Toileting    Toileting assist Assist for toileting: Moderate Assistance - Patient 50 - 74%     Transfers Chair/bed transfer  Transfers assist  Chair/bed transfer activity did not occur: Safety/medical concerns  Chair/bed transfer assist level: Minimal Assistance - Patient > 75%     Locomotion Ambulation   Ambulation assist   Ambulation activity did not occur: Safety/medical concerns  Assist level: Contact Guard/Touching assist Assistive  device: Walker-rolling Max distance: 56ft   Walk 10 feet activity   Assist  Walk 10 feet activity did not occur: Safety/medical concerns  Assist level: Contact Guard/Touching assist Assistive device: Walker-rolling   Walk 50 feet activity   Assist Walk 50 feet with 2 turns activity did not occur: Safety/medical concerns         Walk 150 feet activity   Assist Walk 150 feet activity did not occur: Safety/medical  concerns         Walk 10 feet on uneven surface  activity   Assist Walk 10 feet on uneven surfaces activity did not occur: Safety/medical concerns         Wheelchair     Assist Will patient use wheelchair at discharge?: Yes Type of Wheelchair: Manual    Wheelchair assist level: Supervision/Verbal cueing Max wheelchair distance: 150    Wheelchair 50 feet with 2 turns activity    Assist        Assist Level: Supervision/Verbal cueing   Wheelchair 150 feet activity     Assist     Assist Level: Supervision/Verbal cueing      Medical Problem List and Plan: 1.  Decreased endurance, dizziness with activity, problems with sequencing and poor safety awareness secondary to right hip fracture s/p right hip hemiarthroplasty.  Continue CIR 2.  Antithrombotics: -DVT/anticoagulation:  Pharmaceutical: Coumadin, goal 2-3  INR therapeutic on 5/6             -antiplatelet therapy: N/A 3. Pain Management: Continue tramadol qid with tylenol prn.  Added ice prn for local measures.   Oxycodone DC'd due to cognitive side effects.  K pad added.  Controlled with meds/interventions on 5/6             Monitor with increased exertion 4. Mood: LCSW to follow for evaluation and support.              -antipsychotic agents: N/A 5. Neuropsych: This patient is ?fully capable of making decisions on her own behalf.  TSH pending  Home Fluoxetine started on 5/6  Patient will require consistent team support 6. Skin/Wound Care: Monitor wound daily for healing.  7. Fluids/Electrolytes/Nutrition: Monitor I/O.   8. Hyponatremia: Acute on chronic --baseline 130. Will monitor with strict I/Os   Sodium 136 on 5/6  Follow up again later in week 9. CAF: Monitor HR tid-on Cardizem, Zebata and coumadin.   Rate controlled on 5/5             Monitor with increased activity. 10. Chronic diastolic CHF: I/Os.  Continue irbesartan, Zebeta and pravachol.   Dose adjustments per cards [facility]  Weights   11/02/19 0402 11/04/19 0438 11/05/19 0544  Weight: 84.6 kg 81.3 kg 81.3 kg   Stable on 5/6 11. Bronchiectasis/COPD: On Dulera bid             Weaned supplemental daytime oxygen as tolerated 13. Reactive leucocytosis: Resolved 14. Pre-renal azotemia: Resolved 15. GERD/cricopharyngeal achalasia: Resumed PPI. Needs to be upright for meals.  16. Drug induced constipation: Added senna to colace as had not had BM since admission.              Increase bowel meds  Improving 17.  Essential hypertension  Labile on 5/6, monitor for trend 18.  Moderate hypoalbuminemia  Supplement initiated on 5/1 19.  Transaminitis  LFTs elevated on 5/1, labs ordered for tomorrow  Continue to monitor 20.  Acute blood loss anemia  Hemoglobin up to 11.5 on 5/6 21.  Vasovagal episode  See #10  No further work-up per pulmonology after review of chest x-ray -follow-up as outpatient.  Resolved  LOS: 6 days A FACE TO FACE EVALUATION WAS PERFORMED  Stephanie Cortez [name] 11/05/2019, 10:09 AM

## 2019-11-05 NOTE — Progress Notes (Signed)
ANTICOAGULATION CONSULT NOTE - Follow Up Consult  Pharmacy Consult for warfarin Indication: atrial fibrillation, s/p R THA  Allergies  Allergen Reactions  . Doxycycline     Rash on face and neck  . Flagyl [Metronidazole] Nausea And Vomiting  . Moxifloxacin Other (See Comments)     Weakness/fatigue  . Pantoprazole Sodium Rash  . Penicillins Rash    Has patient had a PCN reaction causing immediate rash, facial/tongue/throat swelling, SOB or lightheadedness with hypotension:unsure Has patient had a PCN reaction causing severe rash involving mucus membranes or skin necrosis:unsure Has patient had a PCN reaction that required hospitalization:No Has patient had a PCN reaction occurring within the last 10 years:Yes Cannot recall exact reaction due to time lapse If all of the above answers are "NO", then may proceed with Cephalosporin use.     Patient Measurements: Height: 5\' 6"  (167.6 cm) Weight: 81.3 kg (179 lb 3.7 oz) IBW/kg (Calculated) : 59.3  Vital Signs: Temp: 98.3 F (36.8 C) (05/06 0543) Temp Source: Oral (05/06 0543) BP: 163/66 (05/06 0544) Pulse Rate: 82 (05/06 0544)  Labs: Recent Labs    11/03/19 0523 11/03/19 0523 11/04/19 0656 11/05/19 0520  HGB 10.0*   < > 10.9* 11.5*  HCT 31.8*  --  34.3* 36.6  PLT 236  --  292 326  LABPROT 23.7*  --  25.8* 27.1*  INR 2.2*  --  2.5* 2.6*  CREATININE 0.77  --  0.63 0.75   < > = values in this interval not displayed.    Estimated Creatinine Clearance: 56.3 mL/min (by C-G formula based on SCr of 0.75 mg/dL).  Assessment: 86 year old female with past medical history significant for HF, HTN, probable ILD. Patient on warfarin PTA for hx of atrial fibrillation. Pt admitted for fall d/t hip fracture, underwent right hip arthroplasty on 4/28. Pharmacy consulted 10/30/19 to resume warfarin dosing post-op.  INR remains therapeutic at 2.6, home dose has been resumed.  PTA warfarin dose = 1.25 mg on MWF, 2.5 mg all other days     Goal of Therapy:  INR 2-3  Plan:  -Warfarin 2.5mg  PO qday except 1.25mg  MWF -Daily INR, CBC -Monitor for signs and symptoms of bleed   Arrie Senate, PharmD, BCPS Clinical Pharmacist [phone] Please check AMION for all [place] numbers 11/05/2019

## 2019-11-05 NOTE — Plan of Care (Signed)
  Problem: Consults Goal: RH GENERAL PATIENT EDUCATION Description: See Patient Education module for education specifics. Outcome: Progressing   Problem: RH BLADDER ELIMINATION Goal: RH STG MANAGE BLADDER WITH ASSISTANCE Description: STG Manage Bladder With Min Assistance Outcome: Progressing   Problem: RH SKIN INTEGRITY Goal: RH STG SKIN FREE OF INFECTION/BREAKDOWN Description: No new breakdown with min assist  Outcome: Progressing Goal: RH STG ABLE TO PERFORM INCISION/WOUND CARE W/ASSISTANCE Description: STG Able To Perform Incision/Wound Care With [employer]. Outcome: Progressing   Problem: RH SAFETY Goal: RH STG ADHERE TO SAFETY PRECAUTIONS W/ASSISTANCE/DEVICE Description: STG Adhere to Safety Precautions With Min Assistance/Device. Outcome: Progressing   Problem: RH PAIN MANAGEMENT Goal: RH STG PAIN MANAGED AT OR BELOW PT'S PAIN GOAL Description: < 3 out of 10.  Outcome: Progressing   Problem: RH BOWEL ELIMINATION Goal: RH STG MANAGE BOWEL WITH ASSISTANCE Description: STG Manage Bowel with min Assistance. Outcome: Progressing Goal: RH STG MANAGE BOWEL W/MEDICATION W/ASSISTANCE Description: STG Manage Bowel with Medication with min Assistance. Outcome: Progressing

## 2019-11-05 NOTE — Progress Notes (Signed)
   Patient seen by cardiology for pre-syncope. Diltiazem and bisoprolol doses reduced to minimize hypotension/bradycardia. Had isolated soft BP and HR in the 50s yesterday. No recurrent pre-syncope.   [facility] will sign off.   Medication Recommendations:  Continue current medications. Can upitrate diltiazem and/or bisoprolol if needed for HTN/Afib if no recurrent hypotension or pre-syncope Other recommendations (labs, testing, etc):  None Follow up as an outpatient:  Post-hospital follow-up arranged with Tommye Standard, PA-C 11/26/19. AVS updated.  Abigail Butts, PA-C 11/05/19; 12:04 PM

## 2019-11-06 ENCOUNTER — Inpatient Hospital Stay (HOSPITAL_COMMUNITY): Payer: Medicare Other

## 2019-11-06 ENCOUNTER — Inpatient Hospital Stay (HOSPITAL_COMMUNITY): Payer: Medicare Other | Admitting: Occupational Therapy

## 2019-11-06 DIAGNOSIS — I10 Essential (primary) hypertension: Secondary | ICD-10-CM

## 2019-11-06 LAB — COMPREHENSIVE METABOLIC PANEL
ALT: 24 U/L (ref 0–44)
AST: 24 U/L (ref 15–41)
Albumin: 2.3 g/dL — ABNORMAL LOW (ref 3.5–5.0)
Alkaline Phosphatase: 118 U/L (ref 38–126)
Anion gap: 7 (ref 5–15)
BUN: 5 mg/dL — ABNORMAL LOW (ref 8–23)
CO2: 26 mmol/L (ref 22–32)
Calcium: 8.5 mg/dL — ABNORMAL LOW (ref 8.9–10.3)
Chloride: 101 mmol/L (ref 98–111)
Creatinine, Ser: 0.68 mg/dL (ref 0.44–1.00)
GFR calc Af Amer: >60 mL/min (ref >60)
GFR calc non Af Amer: >60 mL/min (ref >60)
Glucose, Bld: 91 mg/dL (ref 70–99)
Potassium: 4.6 mmol/L (ref 3.5–5.1)
Sodium: 134 mmol/L — ABNORMAL LOW (ref 135–145)
Total Bilirubin: 1.1 mg/dL (ref 0.3–1.2)
Total Protein: 5.6 g/dL — ABNORMAL LOW (ref 6.5–8.1)

## 2019-11-06 LAB — PROTIME-INR
INR: 2.4 — ABNORMAL HIGH (ref 0.8–1.2)
Prothrombin Time: 25.3 seconds — ABNORMAL HIGH (ref 11.4–15.2)

## 2019-11-06 LAB — CBC
HCT: 34.2 % — ABNORMAL LOW (ref 36.0–46.0)
Hemoglobin: 10.7 g/dL — ABNORMAL LOW (ref 12.0–15.0)
MCH: 30 pg (ref 26.0–34.0)
MCHC: 31.3 g/dL (ref 30.0–36.0)
MCV: 95.8 fL (ref 80.0–100.0)
Platelets: 326 10*3/uL (ref 150–400)
RBC: 3.57 MIL/uL — ABNORMAL LOW (ref 3.87–5.11)
RDW: 13.4 % (ref 11.5–15.5)
WBC: 7.2 10*3/uL (ref 4.0–10.5)
nRBC: 0 % (ref 0.0–0.2)

## 2019-11-06 MED ORDER — DILTIAZEM HCL ER COATED BEADS 180 MG PO CP24
180.0000 mg | ORAL_CAPSULE | Freq: Every day | ORAL | Status: DC
  Administered 2019-11-06 – 2019-11-15 (×10): 180 mg via ORAL
  Filled 2019-11-06 (×10): qty 1

## 2019-11-06 MED ORDER — PRO-STAT SUGAR FREE PO LIQD
30.0000 mL | Freq: Two times a day (BID) | ORAL | Status: DC
  Administered 2019-11-06 – 2019-11-15 (×19): 30 mL via ORAL
  Filled 2019-11-06 (×19): qty 30

## 2019-11-06 NOTE — Progress Notes (Addendum)
[place] PHYSICAL MEDICINE & REHABILITATION PROGRESS NOTE  Subjective/Complaints: Patient seen laying in bed this AM, working with therapies.  She states she slept well overnight for the most part.  Discussed cognition and functional as well as improvement in pain with therapies.  She is seen by cardiology yesterday, notes reviewed-A. fib meds decreased, may increase if necessary.  ROS: Denies CP, SOB, N/V/D  Objective: Vital Signs: Blood pressure (!) 149/60, pulse 73, temperature 98.4 F (36.9 C), temperature source Oral, resp. rate 18, height 5\' 6"  (1.676 m), weight 81.3 kg, SpO2 98 %. No results found. Recent Labs    11/05/19 0520 11/06/19 0627  WBC 8.1 7.2  HGB 11.5* 10.7*  HCT 36.6 34.2*  PLT 326 326   Recent Labs    11/05/19 0520 11/06/19 0627  NA 136 134*  K 4.7 4.6  CL 102 101  CO2 27 26  GLUCOSE 102* 91  BUN 8 5*  CREATININE 0.75 0.68  CALCIUM 8.9 8.5*    Physical Exam: BP (!) 149/60 (BP Location: Left Arm)   Pulse 73   Temp 98.4 F (36.9 C) (Oral)   Resp 18   Ht 5\' 6"  (1.676 m)   Wt 81.3 kg   LMP  (LMP Unknown)   SpO2 98%   BMI 28.93 kg/m   Constitutional: No distress . Vital signs reviewed. HENT: Normocephalic.  Atraumatic. Eyes: EOMI. No discharge. Cardiovascular: No JVD. Respiratory: Normal effort.  No stridor. GI: Non-distended. Skin: Right hip incision with staples with mild serosanguinous drainage.  Psych: Tangential at times.  Musc: Right hip with edema and tenderness Neurological: Alert Motor: Motor: Left lower extremity: 4+/5 hip flexion, knee extension, 4+/5 ankle dorsiflexion Right lower extremity: Hip flexion, knee extension 3 +/5, ankle dorsiflexion 4/5  Assessment/Plan: 1. Functional deficits secondary to right hip fracture status post right hip hemiarthroplasty which require 3+ hours per day of interdisciplinary therapy in a comprehensive inpatient rehab setting.  Physiatrist is providing close team supervision and 24 hour  management of active medical problems listed below.  Physiatrist and rehab team continue to assess barriers to discharge/monitor patient progress toward functional and medical goals  Care Tool:  Bathing    Body parts bathed by patient: Right arm, Left arm, Chest, Abdomen, Front perineal area, Buttocks, Right upper leg, Left upper leg, Left lower leg, Face   Body parts bathed by helper: Right lower leg     Bathing assist Assist Level: Minimal Assistance - Patient > 75%     Upper Body Dressing/Undressing Upper body dressing   What is the patient wearing?: Pull over shirt, Bra    Upper body assist Assist Level: Minimal Assistance - Patient > 75%    Lower Body Dressing/Undressing Lower body dressing      What is the patient wearing?: Incontinence brief, Pants     Lower body assist Assist for lower body dressing: Minimal Assistance - Patient > 75%     Toileting Toileting    Toileting assist Assist for toileting: Minimal Assistance - Patient > 75%     Transfers Chair/bed transfer  Transfers assist  Chair/bed transfer activity did not occur: Safety/medical concerns  Chair/bed transfer assist level: Minimal Assistance - Patient > 75%     Locomotion Ambulation   Ambulation assist   Ambulation activity did not occur: Safety/medical concerns  Assist level: Minimal Assistance - Patient > 75% Assistive device: Walker-rolling Max distance: 46ft   Walk 10 feet activity   Assist  Walk 10 feet activity did not  occur: Safety/medical concerns  Assist level: Minimal Assistance - Patient > 75% Assistive device: Walker-rolling   Walk 50 feet activity   Assist Walk 50 feet with 2 turns activity did not occur: Safety/medical concerns  Assist level: Minimal Assistance - Patient > 75% Assistive device: Walker-rolling    Walk 150 feet activity   Assist Walk 150 feet activity did not occur: Safety/medical concerns  Assist level: Minimal Assistance - Patient >  75% Assistive device: Walker-rolling    Walk 10 feet on uneven surface  activity   Assist Walk 10 feet on uneven surfaces activity did not occur: Safety/medical concerns         Wheelchair     Assist Will patient use wheelchair at discharge?: Yes Type of Wheelchair: Manual    Wheelchair assist level: Supervision/Verbal cueing Max wheelchair distance: 150    Wheelchair 50 feet with 2 turns activity    Assist        Assist Level: Supervision/Verbal cueing   Wheelchair 150 feet activity     Assist     Assist Level: Supervision/Verbal cueing      Medical Problem List and Plan: 1.  Decreased endurance, dizziness with activity, problems with sequencing and poor safety awareness secondary to right hip fracture s/p right hip hemiarthroplasty.  Continue CIR 2.  Antithrombotics: -DVT/anticoagulation:  Pharmaceutical: Coumadin, goal 2-3  INR therapeutic on 5/6             -antiplatelet therapy: N/A 3. Pain Management: Continue tramadol qid with tylenol prn.  Added ice prn for local measures.   Oxycodone DC'd due to cognitive side effects.  K pad added.  Controlled with meds/interventions on 5/6             Monitor with increased exertion 4. Mood: LCSW to follow for evaluation and support.              -antipsychotic agents: N/A 5. Neuropsych: This patient is ?fully capable of making decisions on her own behalf.  TSH pending  Home Fluoxetine started on 5/6  Patient will require consistent team support 6. Skin/Wound Care: Monitor wound daily for healing.  7. Fluids/Electrolytes/Nutrition: Monitor I/O.   8. Hyponatremia: Acute on chronic --baseline 130. Will monitor with strict I/Os   Sodium 134 on 5/7, labs ordered for Monday  Follow up again later in week 9. CAF: Monitor HR tid-on Cardizem, Zebata and coumadin.   Cardizem increased to 180 on 5/7  Rate controlled on 5/7             Monitor with increased activity. 10. Chronic diastolic CHF: I/Os.   Continue irbesartan, Zebeta and pravachol.   Dose adjustments per cards [facility] Weights   11/02/19 0402 11/04/19 0438 11/05/19 0544  Weight: 84.6 kg 81.3 kg 81.3 kg   Stable on 5/7 11. Bronchiectasis/COPD: On Dulera bid             Weaned supplemental daytime oxygen, continue supplemental oxygen nightly 13. Reactive leucocytosis: Resolved 14. Pre-renal azotemia: Resolved 15. GERD/cricopharyngeal achalasia: Resumed PPI. Needs to be upright for meals.  16. Drug induced constipation: Added senna to colace as had not had BM since admission.              Increase bowel meds  Improving 17.  Essential hypertension  Elevated on 5/7  See #9 18.  Moderate hypoalbuminemia  Supplement initiated on 5/1 19.  Transaminitis: Resolved  LFTs elevated within normal limits on 5/7  Continue to monitor 20.  Acute blood loss  anemia  Hemoglobin up to 10.7 on 5/7 21.  Vasovagal episode  See #10  No further work-up per pulmonology after review of chest x-ray -follow-up as outpatient.  Resolved 22.  Hypoalbuminemia  Supplement initiated on 5/7    LOS: 7 days A FACE TO FACE EVALUATION WAS PERFORMED  Layden Caterino [name] 11/06/2019, 10:38 AM

## 2019-11-06 NOTE — Progress Notes (Signed)
Occupational Therapy Weekly Progress Note  Patient Details  Name: Stephanie Cortez MRN: [id number] Date of Birth: Jun 24, 1934  Beginning of progress report period: Oct 31, 2019 End of progress report period: Nov 06, 2019  Today's Date: 11/06/2019 OT Individual Time: 0905-1020 OT Individual Time Calculation (min): 75 min    Patient has met 3 of 4 short term goals.  Pt is making steady progress towards goals.  Pt currently requires min assist with sit > stand and stand pivot transfers.  Pt requires min assist with LB dressing and will continue to require practice with AE for LB dressing to increase independence.  Pt continues to be limited by pain in Rt hip, however is very motivated to progress towards goals.  Patient continues to demonstrate the following deficits: muscle weakness, decreased cardiorespiratoy endurance, decreased coordination and decreased motor planning, decreased safety awareness and decreased memory and decreased sitting balance, decreased standing balance, decreased postural control, decreased balance strategies and difficulty maintaining precautions and therefore will continue to benefit from skilled OT intervention to enhance overall performance with BADL and Reduce care partner burden.  Patient progressing toward long term goals..  Continue plan of care.  OT Short Term Goals Week 1:  OT Short Term Goal 1 (Week 1): Pt will thread BLE into pants wiht AE PRN and MIN VC OT Short Term Goal 1 - Progress (Week 1): Met OT Short Term Goal 2 (Week 1): Pt will don socks with AE PRN and MIN VC OT Short Term Goal 2 - Progress (Week 1): Progressing toward goal OT Short Term Goal 3 (Week 1): Pt will complete CM for toileting with MIN A for standing balnace OT Short Term Goal 3 - Progress (Week 1): Met OT Short Term Goal 4 (Week 1): Pt will complete toilet transfer wiht MIN A OT Short Term Goal 4 - Progress (Week 1): Met Week 2:  OT Short Term Goal 1 (Week 2): STG = LTGs due to remaining  LOS  Skilled Therapeutic Interventions/Progress Updates:    Treatment session with focus on functional transfers, sit > stand, dynamic standing balance, and use of AE for LB dressing.  Pt received upright in w/c reporting her family has concerns about d/c home with her dog (who she tripped over when she fell), but that pt is very motivated to have her dog home with her.  Discussed various scenarios and possible resources to assist with transition home and eventual return of her dog to her home.  Pt pleased with information provided and reassurance of recommendation for HHOT/PT for continued recovery.  Pt agreeable to shower.  Therapist covered dressing over hip incision to ensure maintaining dry. Pt completed all sit > stand and stand pivot transfers with CGA this session.  Pt will benefit from long handled sponge for LB bathing.  Completed dressing with setup/supervision for UB dressing and utilized reacher for LB dressing.  Therapist provided cues for sequencing with use of reacher.  Pt would benefit from continued practice with AE for LB dressing as pt required total assist when donning socks/shoes.  Pt remained upright in w/c with chair alarm on and all needs in reach.  RN arriving to administer meds.  Therapy Documentation Precautions:  Precautions Precautions: Posterior Hip Precaution Booklet Issued: Yes (comment) Precaution Comments: Unable to recall.  All THP reviewed Restrictions Weight Bearing Restrictions: Yes RLE Weight Bearing: Weight bearing as tolerated General:   Vital Signs: Oxygen Therapy O2 Device: Room Air Pain:  Pt reports pain 6/10 in Rt  hip.  RN provided meds at end of session.   Therapy/Group: Individual Therapy  Simonne Come 11/06/2019, 12:17 PM

## 2019-11-06 NOTE — Progress Notes (Signed)
Occupational Therapy Session Note  Patient Details  Name: Stephanie Cortez MRN: [id number] Date of Birth: 24-Jan-1934  Today's Date: 11/06/2019 OT Individual Time: 1500-1530 OT Individual Time Calculation (min): 30 min    Short Term Goals: Week 2:  OT Short Term Goal 1 (Week 2): STG = LTGs due to remaining LOS  Skilled Therapeutic Interventions/Progress Updates:  Patient met seated in wc in agreement with OT treatment session with focus on household functional mobility and self-care re-education as detailed below. Patient stand-pivot transfer to [facility] over standard toilet in bathroom with Min A and use of RW with vc's for proximity and hand placement. Patient continent of bladder requiring Min A for clothing management in standing. Hand hygiene standing at sink level with CGA. Total A for wc mobility to dayroom in prep for household mobility with use of RW. From dayroom, patient walked around nurses station and back to room with Min A, use of RW and 3 seated rest breaks. OT provided education on pursed lip breathing with patient able to return demonstrate during seated rest breaks. Return to supine with Min A to bring RLE from EOB to bed level. Session concluded with patient lying supine in bed with call bell within reach, bed alarm activated, and all needs met.   Therapy Documentation Precautions:  Precautions Precautions: Posterior Hip Precaution Booklet Issued: Yes (comment) Precaution Comments: Unable to recall.  All THP reviewed Restrictions Weight Bearing Restrictions: Yes RLE Weight Bearing: Weight bearing as tolerated  Therapy/Group: Individual Therapy  Melissa Tomaselli R [place]Davis 11/06/2019, 1:01 PM

## 2019-11-06 NOTE — Progress Notes (Signed)
Physical Therapy Weekly Progress Note  Patient Details  Name: Stephanie Cortez MRN: [id number] Date of Birth: May 18, 1934  Beginning of progress report period: Oct 31, 2019 End of progress report period: Nov 06, 2019  Today's Date: 11/06/2019 PT Individual Time: 4098-1191 and 1330 1427  PT Individual Time Calculation (min): 56 min and 57 min  Patient has met 3 of 3 short term goals. Pt demonstrates improvements in functional mobility/transfers, LE strength, ambulation, stair navigation, and endurance. Pt currently requires min A for bed mobility, min A/CGA for transfers with RW, min A/CGA to ambulate 145f with RW, mod A to navigate 2 steps with 1 rail, and supervision for WC mobility 1526f However, pt continues to be limited by high pain levels in R hip along incision. Pt has been educated on importance of staying ahead of pain by taking medication in order to get maximal benefits from therapy.   Patient continues to demonstrate the following deficits muscle weakness and decreased standing balance, decreased postural control, decreased balance strategies and difficulty maintaining precautions and therefore will continue to benefit from skilled PT intervention to increase functional independence with mobility.  Patient progressing toward long term goals..  Continue plan of care.  PT Short Term Goals Week 1:  PT Short Term Goal 1 (Week 1): Pt will transfer bed<>chair w/ min assist PT Short Term Goal 1 - Progress (Week 1): Met PT Short Term Goal 2 (Week 1): Pt will ambulate 25' w/ min assist w/ LRAD PT Short Term Goal 2 - Progress (Week 1): Met PT Short Term Goal 3 (Week 1): Pt will initiate stair training PT Short Term Goal 3 - Progress (Week 1): Met Week 2:  PT Short Term Goal 1 (Week 2): STG=LTG due to LOS  Skilled Therapeutic Interventions/Progress Updates:  Ambulation/gait training;Discharge planning;DME/adaptive equipment instruction;Functional mobility training;Pain  management;Psychosocial support;Splinting/orthotics;Therapeutic Activities;UE/LE Strength taining/ROM;Wheelchair propulsion/positioning;UE/LE Coordination activities;Therapeutic Exercise;Stair training;Skin care/wound management;Patient/family education;Disease management/prevention;Neuromuscular re-education;Community reintegration;Balance/vestibular training    Today's Interventions: Treatment Session 1: [id number] min Received pt supine in bed with daughter present at bedside and RN administering medications, pt agreeable to therapy, and reported pain 6/10 in R LE along incision (premedicated). Repositioning, rest breaks, and distraction done to reduce pain levels. Session focused on functional mobility/transfers, dressing, toileting, LE strength, dynamic standing balance/coordination, ambulation, and improved activity tolerance. MD in briefly for morning rounds. Pt continues to require cues to maintain posterior hip precautions throughout session as she often forgets. Doffed dirty pants in supine with min A and donned clean pants with mod A to maintain posterior hip precautions. Pt transferred supine<>sitting EOB from flat bed with supervision and use of bedrails. While sitting EOB doffed dirty shirt and donned bra and pull over shirt with supervision. Donned socks and shoes with total assist per posterior hip precautions. Pt transferred sit<>stand with RW min A and ambulated 1569fith RW min A to bathroom and doffed pants with mod A in standing. Pt able to void and perform hygiene management with supervision. Pt transferred sit<>stand min A with RW and required max A to pull pants over hips. Pt ambulated 57f65fth RW min A back to WC. Pt washed hands, brushed teeth, and combed hair seated in WC at sink with supervision. Pt appeared SOB, O2 sat 92% on RA and HR 86 bpm after activities increasing to 95% with cues for pursed lip breathing. Pt ambulated 33ft68fh RW min A. Pt continues to demonstrate step to  gait pattern, decreased bilateral foot clearance, decreased trunk rotation,  and decreased weight shifting to R requiring verbal cues to correct. O2 sat 88% on RA increasing to 93% with 2 minute seated rest break. Pt transported back to room in [facility] total assist. Worked on sit<>stands with RW min A, pt only able to x2 in a row before needing to sit and rest due to SOB. Concluded session with pt sitting in WC, needs within reach, and chair pad alarm on.    Treatment Session 2: 1093-2355 73 min Received pt supine in bed, pt agreeable to therapy, and reported pain 7/10 in R LE along incision (premedicated). Repositioning, rest breaks, and distraction done to reduce pain levels. Session focused on functional mobility/transfers, toileting, LE strength, dynamic standing balance/coordination, ambulation, and improved activity tolerance. Pt transferred supine<>sitting EOB with supervision with use of bedrails. Donned shoes max A for time management purposes. Pt transferred sit<>stand with RW min A and ambulated 72f to/from bathroom with RW min A. Pt able to doff pants/brief with CGA and able to void and perform hygiene management with supervision. Pt transferred sit<>stand with RW min A and required max A to pull pants/brief over hips in standing to maintain posterior hip precautions. Pt washed hands seated in WC at sink with supervision. Pt performed WC mobility 1596fusing bilateral UEs and increased time to therapy gym. Pt navigated 4 steps with 1 rail on R using lateral stepping technique. Pt required verbal cues for stepping sequence, foot placement on step, and technique. Pt ambulated 7595fith RW min A with increased time. O2 sat 88% increasing to 94% on RA after 3 minute seated rest break and cues for pursed lip breathing. Pt [facility]. Concluded session with pt sitting in WC, needs within reach, and chair pad alarm on. Therapist provide fresh ice water to pt.   Therapy  Documentation Precautions:  Precautions Precautions: Posterior Hip Precaution Booklet Issued: Yes (comment) Precaution Comments: Unable to recall.  All THP reviewed Restrictions Weight Bearing Restrictions: Yes RLE Weight Bearing: Weight bearing as tolerated  Therapy/Group: Individual Therapy AnnAlfonse Alpers, DPT   11/06/2019, 7:27 AM

## 2019-11-06 NOTE — Progress Notes (Signed)
ANTICOAGULATION CONSULT NOTE - Follow Up Consult  Pharmacy Consult for warfarin Indication: atrial fibrillation, s/p R THA  Allergies  Allergen Reactions  . Doxycycline     Rash on face and neck  . Flagyl [Metronidazole] Nausea And Vomiting  . Moxifloxacin Other (See Comments)     Weakness/fatigue  . Pantoprazole Sodium Rash  . Penicillins Rash    Has patient had a PCN reaction causing immediate rash, facial/tongue/throat swelling, SOB or lightheadedness with hypotension:unsure Has patient had a PCN reaction causing severe rash involving mucus membranes or skin necrosis:unsure Has patient had a PCN reaction that required hospitalization:No Has patient had a PCN reaction occurring within the last 10 years:Yes Cannot recall exact reaction due to time lapse If all of the above answers are "NO", then may proceed with Cephalosporin use.     Patient Measurements: Height: 5\' 6"  (167.6 cm) Weight: 81.3 kg (179 lb 3.7 oz) IBW/kg (Calculated) : 59.3  Vital Signs: Temp: 98.4 F (36.9 C) (05/07 0633) Temp Source: Oral (05/07 [id number]) BP: 149/60 (05/07 [id number]) Pulse Rate: 73 (05/07 0633)  Labs: Recent Labs    11/04/19 0656 11/04/19 0656 11/05/19 0520 11/06/19 0627  HGB 10.9*   < > 11.5* 10.7*  HCT 34.3*  --  36.6 34.2*  PLT 292  --  326 326  LABPROT 25.8*  --  27.1* 25.3*  INR 2.5*  --  2.6* 2.4*  CREATININE 0.63  --  0.75 0.68   < > = values in this interval not displayed.    Estimated Creatinine Clearance: 56.3 mL/min (by C-G formula based on SCr of 0.68 mg/dL).  Assessment: 86 year old female with past medical history significant for HF, HTN, probable ILD. Patient on warfarin PTA for hx of atrial fibrillation. Pt admitted for fall d/t hip fracture, underwent right hip arthroplasty on 4/28. Pharmacy consulted 10/30/19 to resume warfarin dosing post-op.  INR remains therapeutic at 2.4, home dose has been resumed.  PTA warfarin dose = 1.25 mg on MWF, 2.5 mg all other days     Goal of Therapy:  INR 2-3  Plan:  -Warfarin 2.5mg  PO daily Tues/Thurs/Sat/Sun and 1.25mg  PO daily Mon/Wed/Fri -Daily INR, CBC -Monitor for signs and symptoms of bleed   Arrie Senate, PharmD, BCPS Clinical Pharmacist [phone] Please check AMION for all [place] numbers 11/06/2019

## 2019-11-07 ENCOUNTER — Inpatient Hospital Stay (HOSPITAL_COMMUNITY): Payer: Medicare Other

## 2019-11-07 LAB — CBC
HCT: 33.6 % — ABNORMAL LOW (ref 36.0–46.0)
Hemoglobin: 10.5 g/dL — ABNORMAL LOW (ref 12.0–15.0)
MCH: 30.1 pg (ref 26.0–34.0)
MCHC: 31.3 g/dL (ref 30.0–36.0)
MCV: 96.3 fL (ref 80.0–100.0)
Platelets: 332 10*3/uL (ref 150–400)
RBC: 3.49 MIL/uL — ABNORMAL LOW (ref 3.87–5.11)
RDW: 13.6 % (ref 11.5–15.5)
WBC: 8.4 10*3/uL (ref 4.0–10.5)
nRBC: 0 % (ref 0.0–0.2)

## 2019-11-07 LAB — PROTIME-INR
INR: 2.3 — ABNORMAL HIGH (ref 0.8–1.2)
Prothrombin Time: 24.4 seconds — ABNORMAL HIGH (ref 11.4–15.2)

## 2019-11-07 NOTE — Progress Notes (Signed)
Occupational Therapy Session Note  Patient Details  Name: Stephanie Cortez MRN: [id number] Date of Birth: August 07, 1933  Today's Date: 11/07/2019 OT Individual Time: 1130-1204 OT Individual Time Calculation (min): 34 min    Short Term Goals: Week 1:  OT Short Term Goal 1 (Week 1): Pt will thread BLE into pants wiht AE PRN and MIN VC OT Short Term Goal 1 - Progress (Week 1): Met OT Short Term Goal 2 (Week 1): Pt will don socks with AE PRN and MIN VC OT Short Term Goal 2 - Progress (Week 1): Progressing toward goal OT Short Term Goal 3 (Week 1): Pt will complete CM for toileting with MIN A for standing balnace OT Short Term Goal 3 - Progress (Week 1): Met OT Short Term Goal 4 (Week 1): Pt will complete toilet transfer wiht MIN A OT Short Term Goal 4 - Progress (Week 1): Met  Skilled Therapeutic Interventions/Progress Updates:    OT session focused on ADL retraining and functional transfers/mobility. Pt received supine in bed agreeable to therapy. Pt completed supine>sit EOB with min A and min cues for sequencing. Pt ambulated bed>toilet with min A using RW. Completed toileting with min A for balance and manage brief over R hip. Transferred to w/c to complete UB dressing with min A for bra. OT cued pt for deep breathing techniques as mild SOB noted, however no decline in SpO2. At end of session, pt left in w/c with chair alarm and all needs in reach.   Therapy Documentation Precautions:  Precautions Precautions: Posterior Hip Precaution Booklet Issued: Yes (comment) Precaution Comments: Unable to recall.  All THP reviewed Restrictions Weight Bearing Restrictions: Yes RLE Weight Bearing: Weight bearing as tolerated General:   Vital Signs:  Pain:   ADL:   Vision   Perception    Praxis   Exercises:   Other Treatments:     Therapy/Group: Individual Therapy  Duayne Cal 11/07/2019, 12:09 PM

## 2019-11-07 NOTE — Progress Notes (Signed)
ANTICOAGULATION CONSULT NOTE - Follow Up Consult  Pharmacy Consult for warfarin Indication: atrial fibrillation, s/p R THA  Allergies  Allergen Reactions  . Doxycycline     Rash on face and neck  . Flagyl [Metronidazole] Nausea And Vomiting  . Moxifloxacin Other (See Comments)     Weakness/fatigue  . Pantoprazole Sodium Rash  . Penicillins Rash    Has patient had a PCN reaction causing immediate rash, facial/tongue/throat swelling, SOB or lightheadedness with hypotension:unsure Has patient had a PCN reaction causing severe rash involving mucus membranes or skin necrosis:unsure Has patient had a PCN reaction that required hospitalization:No Has patient had a PCN reaction occurring within the last 10 years:Yes Cannot recall exact reaction due to time lapse If all of the above answers are "NO", then may proceed with Cephalosporin use.     Patient Measurements: Height: 5\' 6"  (167.6 cm) Weight: 81.3 kg (179 lb 3.7 oz) IBW/kg (Calculated) : 59.3  Vital Signs: Temp: 98.3 F (36.8 C) (05/08 0623) Temp Source: Oral (05/08 0623) BP: 128/48 (05/08 0623) Pulse Rate: 72 (05/08 0623)  Labs: Recent Labs    11/05/19 0520 11/05/19 0520 11/06/19 0627 11/07/19 0500  HGB 11.5*   < > 10.7* 10.5*  HCT 36.6  --  34.2* 33.6*  PLT 326  --  326 332  LABPROT 27.1*  --  25.3* 24.4*  INR 2.6*  --  2.4* 2.3*  CREATININE 0.75  --  0.68  --    < > = values in this interval not displayed.    Estimated Creatinine Clearance: 56.3 mL/min (by C-G formula based on SCr of 0.68 mg/dL).  Assessment: 86 year old female with past medical history significant for HF, HTN, probable ILD. Patient on warfarin PTA for hx of atrial fibrillation. Pt admitted for fall d/t hip fracture, underwent right hip arthroplasty on 4/28. Pharmacy consulted 10/30/19 to resume warfarin dosing post-op.  PTA warfarin dose = 1.25 mg on MWF, 2.5 mg all other days    INR remains therapeutic at 2.3, home dose has been  resumed.  Goal of Therapy:  INR 2-3  Plan:  -Warfarin 2.5mg  PO daily Tues/Thurs/Sat/Sun and 1.25mg  PO daily Mon/Wed/Fri -Daily INR, CBC -Monitor for signs and symptoms of bleed   Kennon Holter, PharmD PGY1 Ambulatory Care Pharmacy Resident 11/07/2019

## 2019-11-07 NOTE — Progress Notes (Signed)
[facility] PHYSICAL MEDICINE & REHABILITATION PROGRESS NOTE  Subjective/Complaints: Patient sidelying in bed. Right hip still gives her a lot of discomfort. Asked which positions she could lie in.  ROS: Patient denies fever, rash, sore throat, blurred vision, nausea, vomiting, diarrhea, cough, shortness of breath or chest pain,  headache, or mood change.    Objective: Vital Signs: Blood pressure (!) 128/48, pulse 72, temperature 98.3 F (36.8 C), temperature source Oral, resp. rate 18, height 5\' 6"  (1.676 m), weight 81.3 kg, SpO2 93 %. No results found. Recent Labs    11/06/19 0627 11/07/19 0500  WBC 7.2 8.4  HGB 10.7* 10.5*  HCT 34.2* 33.6*  PLT 326 332   Recent Labs    11/05/19 0520 11/06/19 0627  NA 136 134*  K 4.7 4.6  CL 102 101  CO2 27 26  GLUCOSE 102* 91  BUN 8 5*  CREATININE 0.75 0.68  CALCIUM 8.9 8.5*    Physical Exam: BP (!) 128/48 (BP Location: Right Arm)   Pulse 72   Temp 98.3 F (36.8 C) (Oral)   Resp 18   Ht 5\' 6"  (1.676 m)   Wt 81.3 kg   LMP  (LMP Unknown)   SpO2 93%   BMI 28.93 kg/m   Constitutional: No distress . Vital signs reviewed. HEENT: EOMI, oral membranes moist Neck: supple Cardiovascular: RRR without murmur. No JVD    Respiratory/Chest: CTA Bilaterally without wheezes or rales. Normal effort    GI/Abdomen: BS +, non-tender, non-distended Ext: no clubbing, cyanosis, or edema Psych: pleasant and cooperative Skin: right hip incision almost dry, scant serous drainage from distal aspect Musc: Right hip with edema and tenderness Neurological: Alert Motor: Motor: Left lower extremity: 4+/5 hip flexion, knee extension, 4+/5 ankle dorsiflexion Right lower extremity: Hip flexion 2/5 limited by pain, knee extension 3 +/5, ankle dorsiflexion 4/5  Assessment/Plan: 1. Functional deficits secondary to right hip fracture status post right hip hemiarthroplasty which require 3+ hours per day of interdisciplinary therapy in a comprehensive  inpatient rehab setting.  Physiatrist is providing close team supervision and 24 hour management of active medical problems listed below.  Physiatrist and rehab team continue to assess barriers to discharge/monitor patient progress toward functional and medical goals  Care Tool:  Bathing    Body parts bathed by patient: Right arm, Left arm, Chest, Abdomen, Front perineal area, Right upper leg, Left upper leg, Face   Body parts bathed by helper: Buttocks     Bathing assist Assist Level: Minimal Assistance - Patient > 75%     Upper Body Dressing/Undressing Upper body dressing   What is the patient wearing?: Pull over shirt, Bra    Upper body assist Assist Level: Supervision/Verbal cueing    Lower Body Dressing/Undressing Lower body dressing      What is the patient wearing?: Incontinence brief, Pants     Lower body assist Assist for lower body dressing: Minimal Assistance - Patient > 75%     Toileting Toileting    Toileting assist Assist for toileting: Minimal Assistance - Patient > 75%     Transfers Chair/bed transfer  Transfers assist  Chair/bed transfer activity did not occur: Safety/medical concerns  Chair/bed transfer assist level: Minimal Assistance - Patient > 75%     Locomotion Ambulation   Ambulation assist   Ambulation activity did not occur: Safety/medical concerns  Assist level: Minimal Assistance - Patient > 75% Assistive device: Walker-rolling Max distance: 23ft   Walk 10 feet activity   Assist  Walk  10 feet activity did not occur: Safety/medical concerns  Assist level: Minimal Assistance - Patient > 75% Assistive device: Walker-rolling   Walk 50 feet activity   Assist Walk 50 feet with 2 turns activity did not occur: Safety/medical concerns  Assist level: Minimal Assistance - Patient > 75% Assistive device: Walker-rolling    Walk 150 feet activity   Assist Walk 150 feet activity did not occur: Safety/medical  concerns  Assist level: Minimal Assistance - Patient > 75% Assistive device: Walker-rolling    Walk 10 feet on uneven surface  activity   Assist Walk 10 feet on uneven surfaces activity did not occur: Safety/medical concerns         Wheelchair     Assist Will patient use wheelchair at discharge?: Yes Type of Wheelchair: Manual    Wheelchair assist level: Supervision/Verbal cueing Max wheelchair distance: 150    Wheelchair 50 feet with 2 turns activity    Assist        Assist Level: Supervision/Verbal cueing   Wheelchair 150 feet activity     Assist     Assist Level: Supervision/Verbal cueing      Medical Problem List and Plan: 1.  Decreased endurance, dizziness with activity, problems with sequencing and poor safety awareness secondary to right hip fracture s/p right hip hemiarthroplasty.  Continue CIR 2.  Antithrombotics: -DVT/anticoagulation:  Pharmaceutical: Coumadin, goal 2-3  INR therapeutic on 5/8             -antiplatelet therapy: N/A 3. Pain Management: Continue tramadol qid with tylenol prn.  Added ice prn for local measures.   Oxycodone DC'd due to cognitive side effects.  K pad added. Can use ice too  Controlled with meds/interventions on 5/6             Monitor with increased exertion 4. Mood: LCSW to follow for evaluation and support.              -antipsychotic agents: N/A 5. Neuropsych: This patient is ?fully capable of making decisions on her own behalf.  TSH WNL  Home Fluoxetine started on 5/6  Patient will require consistent team support 6. Skin/Wound Care: Monitor wound daily for healing.  7. Fluids/Electrolytes/Nutrition: Monitor I/O.   8. Hyponatremia: Acute on chronic --baseline 130. Will monitor with strict I/Os   Sodium 134 on 5/7, labs ordered for Monday  Follow up again later in week 9. CAF: Monitor HR tid-on Cardizem, Zebata and coumadin.   Cardizem increased to 180 on 5/7  Rate controlled on 5/8              Monitor with increased activity. 10. Chronic diastolic CHF: I/Os.  Continue irbesartan, Zebeta and pravachol.   Dose adjustments per cards [facility] Weights   11/02/19 0402 11/04/19 0438 11/05/19 0544  Weight: 84.6 kg 81.3 kg 81.3 kg   Stable/decreased on 5/6. No weights this weekend yet. Re-order 11. Bronchiectasis/COPD: On Dulera bid             Weaned supplemental daytime oxygen, continue supplemental oxygen nightly 13. Reactive leucocytosis: Resolved 14. Pre-renal azotemia: Resolved 15. GERD/cricopharyngeal achalasia: Resumed PPI. Needs to be upright for meals.  16. Drug induced constipation: Added senna to colace as had not had BM since admission.              Increase bowel meds  Improving 17.  Essential hypertension  Well controlled 5/8  See #9 18.  Moderate hypoalbuminemia  Supplement initiated on 5/1 19.  Transaminitis: Resolved  LFTs  elevated within normal limits on 5/7  Continue to monitor 20.  Acute blood loss anemia  Hemoglobin up to 10.7 on 5/7 21.  Vasovagal episode  See #10  No further work-up per pulmonology after review of chest x-ray -follow-up as outpatient.  Resolved 22.  Hypoalbuminemia  Supplement initiated on 5/7    LOS: 8 days A FACE TO [place] 11/07/2019, 12:07 PM

## 2019-11-07 NOTE — Plan of Care (Signed)
  Problem: Consults Goal: RH GENERAL PATIENT EDUCATION Description: See Patient Education module for education specifics. Outcome: Progressing   Problem: RH BLADDER ELIMINATION Goal: RH STG MANAGE BLADDER WITH ASSISTANCE Description: STG Manage Bladder With Min Assistance Outcome: Progressing   Problem: RH SKIN INTEGRITY Goal: RH STG SKIN FREE OF INFECTION/BREAKDOWN Description: No new breakdown with min assist  Outcome: Progressing Goal: RH STG ABLE TO PERFORM INCISION/WOUND CARE W/ASSISTANCE Description: STG Able To Perform Incision/Wound Care With [employer]. Outcome: Progressing   Problem: RH SAFETY Goal: RH STG ADHERE TO SAFETY PRECAUTIONS W/ASSISTANCE/DEVICE Description: STG Adhere to Safety Precautions With Min Assistance/Device. Outcome: Progressing   Problem: RH PAIN MANAGEMENT Goal: RH STG PAIN MANAGED AT OR BELOW PT'S PAIN GOAL Description: < 3 out of 10.  Outcome: Progressing   Problem: RH BOWEL ELIMINATION Goal: RH STG MANAGE BOWEL WITH ASSISTANCE Description: STG Manage Bowel with min Assistance. Outcome: Progressing Goal: RH STG MANAGE BOWEL W/MEDICATION W/ASSISTANCE Description: STG Manage Bowel with Medication with min Assistance. Outcome: Progressing

## 2019-11-07 NOTE — Progress Notes (Addendum)
Patient c/o more pain tonight than usual. She asked for pain medication twice & is c/o a pain scale of 8-9/10. Her right hip was assessed. There is slight redness & the skin is firm, no clear signs of infection. There was a small amount of yellowish drainage on the old dressing that was removed, but no new drainage noted & her skin is warm.The staples are intact. When she was ambulated to the restroom a few minutes ago by stand pivot, as she was turning to put her back to the toilet, her left knee buckled & she screamed out in pain. She did not fall, staff was present to help her straighten back up & maneuver herself to take a seat on the bedside commode over the toilet. She seemed much stiffer & cautious than noted in the last few days. No acute distress noted. She was left with staff while the nurse went to get her medication for the morning. Staff was advise to allow her to rest a little prior to taking vitals. She has some labored breathing when she transfers. Will continue to monitor & leave communication for the provider on rounds this morning.

## 2019-11-08 ENCOUNTER — Inpatient Hospital Stay (HOSPITAL_COMMUNITY): Payer: Medicare Other

## 2019-11-08 LAB — CBC
HCT: 35.4 % — ABNORMAL LOW (ref 36.0–46.0)
Hemoglobin: 10.9 g/dL — ABNORMAL LOW (ref 12.0–15.0)
MCH: 30 pg (ref 26.0–34.0)
MCHC: 30.8 g/dL (ref 30.0–36.0)
MCV: 97.5 fL (ref 80.0–100.0)
Platelets: 315 10*3/uL (ref 150–400)
RBC: 3.63 MIL/uL — ABNORMAL LOW (ref 3.87–5.11)
RDW: 13.4 % (ref 11.5–15.5)
WBC: 10.5 10*3/uL (ref 4.0–10.5)
nRBC: 0 % (ref 0.0–0.2)

## 2019-11-08 LAB — PROTIME-INR
INR: 2.8 — ABNORMAL HIGH (ref 0.8–1.2)
Prothrombin Time: 28.7 seconds — ABNORMAL HIGH (ref 11.4–15.2)

## 2019-11-08 MED ORDER — WARFARIN 1.25 MG HALF TABLET
1.2500 mg | ORAL_TABLET | Freq: Once | ORAL | Status: AC
  Administered 2019-11-08: 1.25 mg via ORAL
  Filled 2019-11-08: qty 1

## 2019-11-08 MED ORDER — WARFARIN SODIUM 2.5 MG PO TABS: 2.5000 mg | ORAL_TABLET | ORAL | Status: DC

## 2019-11-08 NOTE — Progress Notes (Signed)
PRN ultram 25mg 's given at 2145. PRN robaxin given at 0057. Right hip with dry dressing. RLE edema. Stephanie Cortez A

## 2019-11-08 NOTE — Plan of Care (Signed)
  Problem: Consults Goal: RH GENERAL PATIENT EDUCATION Description: See Patient Education module for education specifics. Outcome: Progressing   Problem: RH BLADDER ELIMINATION Goal: RH STG MANAGE BLADDER WITH ASSISTANCE Description: STG Manage Bladder With Min Assistance Outcome: Progressing   Problem: RH SKIN INTEGRITY Goal: RH STG SKIN FREE OF INFECTION/BREAKDOWN Description: No new breakdown with min assist  Outcome: Progressing Goal: RH STG ABLE TO PERFORM INCISION/WOUND CARE W/ASSISTANCE Description: STG Able To Perform Incision/Wound Care With [employer]. Outcome: Progressing   Problem: RH SAFETY Goal: RH STG ADHERE TO SAFETY PRECAUTIONS W/ASSISTANCE/DEVICE Description: STG Adhere to Safety Precautions With Min Assistance/Device. Outcome: Progressing   Problem: RH PAIN MANAGEMENT Goal: RH STG PAIN MANAGED AT OR BELOW PT'S PAIN GOAL Description: < 3 out of 10.  Outcome: Progressing   Problem: RH BOWEL ELIMINATION Goal: RH STG MANAGE BOWEL WITH ASSISTANCE Description: STG Manage Bowel with min Assistance. Outcome: Progressing Goal: RH STG MANAGE BOWEL W/MEDICATION W/ASSISTANCE Description: STG Manage Bowel with Medication with min Assistance. Outcome: Progressing

## 2019-11-08 NOTE — Progress Notes (Signed)
ANTICOAGULATION CONSULT NOTE - Follow Up Consult  Pharmacy Consult for warfarin Indication: atrial fibrillation, s/p R THA  Allergies  Allergen Reactions  . Doxycycline     Rash on face and neck  . Flagyl [Metronidazole] Nausea And Vomiting  . Moxifloxacin Other (See Comments)     Weakness/fatigue  . Pantoprazole Sodium Rash  . Penicillins Rash    Has patient had a PCN reaction causing immediate rash, facial/tongue/throat swelling, SOB or lightheadedness with hypotension:unsure Has patient had a PCN reaction causing severe rash involving mucus membranes or skin necrosis:unsure Has patient had a PCN reaction that required hospitalization:No Has patient had a PCN reaction occurring within the last 10 years:Yes Cannot recall exact reaction due to time lapse If all of the above answers are "NO", then may proceed with Cephalosporin use.     Patient Measurements: Height: 5\' 6"  (167.6 cm) Weight: 81.3 kg (179 lb 3.7 oz) IBW/kg (Calculated) : 59.3  Vital Signs: Temp: 98 F (36.7 C) (05/08 1951) Temp Source: Oral (05/08 1951) BP: 138/62 (05/08 1951) Pulse Rate: 75 (05/08 1951)  Labs: Recent Labs    11/06/19 0627 11/06/19 0627 11/07/19 0500 11/08/19 0520  HGB 10.7*   < > 10.5* 10.9*  HCT 34.2*  --  33.6* 35.4*  PLT 326  --  332 315  LABPROT 25.3*  --  24.4* 28.7*  INR 2.4*  --  2.3* 2.8*  CREATININE 0.68  --   --   --    < > = values in this interval not displayed.    Estimated Creatinine Clearance: 56.3 mL/min (by C-G formula based on SCr of 0.68 mg/dL).  Assessment: 86 year old female with past medical history significant for HF, HTN, probable ILD. Patient on warfarin PTA for hx of atrial fibrillation. Pt admitted for fall d/t hip fracture, underwent right hip arthroplasty on 4/28. Pharmacy consulted 10/30/19 to resume warfarin dosing post-op.  PTA warfarin dose = 1.25 mg on MWF, 2.5 mg all other days    INR remains therapeutic at 2.8. Will deviate from home dose  today since INR increased by 0.5 over 24 hours. Could be related to lower PO intake 50-75% of meals eaten. Will give half dose of normally scheduled warfarin. CBC stable. No bleeding noted.  Goal of Therapy:  INR 2-3  Plan:  -Give warfarin 1.25mg  x1 tonight and then plan to resume home dose of warfarin 2.5mg  PO daily Tues/Thurs/Sat/Sun and 1.25mg  PO daily Mon/Wed/Fri -Daily INR, CBC -Monitor for signs and symptoms of bleed   Kennon Holter, PharmD PGY1 Ambulatory Care Pharmacy Resident 11/08/2019

## 2019-11-08 NOTE — Progress Notes (Signed)
Physical Therapy Session Note  Patient Details  Name: Stephanie Cortez MRN: [id number] Date of Birth: 01/23/1934  Today's Date: 11/08/2019 PT Individual Time: [id number] PT Individual Time Calculation (min): 55 min   Short Term Goals:  Week 2:  PT Short Term Goal 1 (Week 2): STG=LTG due to LOS  Skilled Therapeutic Interventions/Progress Updates:  Pt resting in bed.  She denied pain at rest.   Therapeutic exercise performed with LE to increase strength for functional mobility. Supine with head elevated: 20 x 1 alternating ankle pumps; 10 x 1 L straight leg raises, R quad sets, active assistive heel slides R active assistive hip external rotation >< neutral rotation.  Sustained stretch R quads and hip muscles via positioning with knee and hip in flexion resting on pillow, x 3 minutes.  Diaphragmatic breathing exs with good carryover.  PT instructed pt in paced breathing for exertion and painful movements.  Supine> sit to R, no rail, with min/mod assist to slide RLE laterally and position pt's L elbow under her.  Sit> stand with min assist from elevated bed to RW.  Gait training with RW on level tile, x 100' , total ,with multimple turns, CGA.  Pt with SOB requiring standing rest breaks at 60', 80'.    Pt requested getting back to bed.  Sit> supine with mod assist for bil LEs.  At end of session, bed alarm set and needs at hand.     Therapy Documentation Precautions:  Precautions Precautions: Posterior Hip Precaution Booklet Issued: Yes (comment) Precaution Comments: Unable to recall.  All THP reviewed Restrictions Weight Bearing Restrictions: Yes RLE Weight Bearing: Weight bearing as tolerated      Therapy/Group: Individual Therapy  Abiel Antrim 11/08/2019, 2:45 PM

## 2019-11-09 ENCOUNTER — Inpatient Hospital Stay (HOSPITAL_COMMUNITY): Payer: Medicare Other | Admitting: Occupational Therapy

## 2019-11-09 ENCOUNTER — Inpatient Hospital Stay (HOSPITAL_COMMUNITY): Payer: Medicare Other

## 2019-11-09 ENCOUNTER — Inpatient Hospital Stay (HOSPITAL_COMMUNITY): Payer: Medicare Other | Admitting: Physical Therapy

## 2019-11-09 LAB — BASIC METABOLIC PANEL
Anion gap: 11 (ref 5–15)
BUN: 15 mg/dL (ref 8–23)
CO2: 23 mmol/L (ref 22–32)
Calcium: 9.2 mg/dL (ref 8.9–10.3)
Chloride: 97 mmol/L — ABNORMAL LOW (ref 98–111)
Creatinine, Ser: 0.7 mg/dL (ref 0.44–1.00)
GFR calc Af Amer: >60 mL/min (ref >60)
GFR calc non Af Amer: >60 mL/min (ref >60)
Glucose, Bld: 108 mg/dL — ABNORMAL HIGH (ref 70–99)
Potassium: 4.3 mmol/L (ref 3.5–5.1)
Sodium: 131 mmol/L — ABNORMAL LOW (ref 135–145)

## 2019-11-09 LAB — CBC
HCT: 34.8 % — ABNORMAL LOW (ref 36.0–46.0)
Hemoglobin: 10.9 g/dL — ABNORMAL LOW (ref 12.0–15.0)
MCH: 30.2 pg (ref 26.0–34.0)
MCHC: 31.3 g/dL (ref 30.0–36.0)
MCV: 96.4 fL (ref 80.0–100.0)
Platelets: 349 10*3/uL (ref 150–400)
RBC: 3.61 MIL/uL — ABNORMAL LOW (ref 3.87–5.11)
RDW: 13.6 % (ref 11.5–15.5)
WBC: 10.1 10*3/uL (ref 4.0–10.5)
nRBC: 0 % (ref 0.0–0.2)

## 2019-11-09 LAB — PROTIME-INR
INR: 3.3 — ABNORMAL HIGH (ref 0.8–1.2)
Prothrombin Time: 32.1 seconds — ABNORMAL HIGH (ref 11.4–15.2)

## 2019-11-09 NOTE — Progress Notes (Signed)
ANTICOAGULATION CONSULT NOTE - Follow Up Consult  Pharmacy Consult for warfarin Indication: atrial fibrillation, s/p R THA  Allergies  Allergen Reactions  . Doxycycline     Rash on face and neck  . Flagyl [Metronidazole] Nausea And Vomiting  . Moxifloxacin Other (See Comments)     Weakness/fatigue  . Pantoprazole Sodium Rash  . Penicillins Rash    Has patient had a PCN reaction causing immediate rash, facial/tongue/throat swelling, SOB or lightheadedness with hypotension:unsure Has patient had a PCN reaction causing severe rash involving mucus membranes or skin necrosis:unsure Has patient had a PCN reaction that required hospitalization:No Has patient had a PCN reaction occurring within the last 10 years:Yes Cannot recall exact reaction due to time lapse If all of the above answers are "NO", then may proceed with Cephalosporin use.     Patient Measurements: Height: 5\' 6"  (167.6 cm) Weight: 81.3 kg (179 lb 3.7 oz) IBW/kg (Calculated) : 59.3  Vital Signs:    Labs: Recent Labs    11/07/19 0500 11/07/19 0500 11/08/19 0520 11/09/19 0532  HGB 10.5*   < > 10.9* 10.9*  HCT 33.6*  --  35.4* 34.8*  PLT 332  --  315 349  LABPROT 24.4*  --  28.7* 32.1*  INR 2.3*  --  2.8* 3.3*   < > = values in this interval not displayed.    Estimated Creatinine Clearance: 56.3 mL/min (by C-G formula based on SCr of 0.68 mg/dL).  Assessment: 86 year old female with past medical history significant for HF, HTN, probable ILD. Patient on warfarin PTA for hx of atrial fibrillation. Pt admitted for fall d/t hip fracture, underwent right hip arthroplasty on 4/28. Pharmacy consulted 10/30/19 to resume warfarin dosing post-op.  PTA warfarin dose = 1.25 mg on MWF, 2.5 mg all other days    INR went up to 3.3 today despite a dose reduction yesterday. We will hold coumadin today and let the INR trends down to range again. Hgb stable at 10.9  Goal of Therapy:  INR 2-3  Plan:  -No coumadin  today -Daily INR, CBC -Monitor for signs and symptoms of bleed  Onnie Boer, PharmD, BCIDP, AAHIVP, CPP Infectious Disease Pharmacist 11/09/2019 11:10 AM

## 2019-11-09 NOTE — Progress Notes (Signed)
Occupational Therapy Session Note  Patient Details  Name: Stephanie Cortez MRN: [id number] Date of Birth: 20-Apr-1934  Today's Date: 11/09/2019 OT Individual Time: [id number] OT Individual Time Calculation (min): 73 min    Short Term Goals: Week 2:  OT Short Term Goal 1 (Week 2): STG = LTGs due to remaining LOS  Skilled Therapeutic Interventions/Progress Updates:    Treatment session with focus on functional mobility with sit > stand and dynamic standing balance.  Pt received upright in w/c in semi-reclined position due to reports of pain in Rt hip.  Engaged in oral care in sitting at sink with focus on anterior weight shift to position in front of sink.  Pt changed shirt and bra requiring min assist for weight shifting due to positioning in w/c.  Engaged in sit > stand from w/c with mod assist and facilitation for weight shifting due to apprehension about pain in Rt hip.  Engaged in weight shifting in standing with focus on increased acceptance of weight through RLE.  Pt reports lightheadedness/dizzines post standing.  Cued on breathing technique to calm nerves.  O2 sats 87% on room air, requiring increased time to improve to low 90s.  Engaged in blocked practice sit > stand with focus on anterior weight shift and hand placement to increase success with sit <> stand.  Mod assist for sit > stand throughout session, pt with increased anxiety once upright therefore unable to complete any stand pivot or ambulatory transfers at this time.  Manual facilitation for placement of RLE to increase tolerance to weight bearing.  Vitals monitored intermittently with all WNLs.  Pt returned to room and left upright in w/c with all needs in reach.  Therapy Documentation Precautions:  Precautions Precautions: Posterior Hip Precaution Booklet Issued: Yes (comment) Precaution Comments: Unable to recall.  All THP reviewed Restrictions Weight Bearing Restrictions: Yes RLE Weight Bearing: Weight bearing as  tolerated Pain:   Pt with c/o pain 8/10 in Rt hip.  Premedicated.  Therapy/Group: Individual Therapy  Simonne Come 11/09/2019, 12:07 PM

## 2019-11-09 NOTE — Progress Notes (Signed)
Physical Therapy Session Note  Patient Details  Name: Stephanie Cortez MRN: [id number] Date of Birth: Sep 01, 1933  Today's Date: 11/09/2019 PT Individual Time: 1300-1420 PT Individual Time Calculation (min): 80 min   Short Term Goals: Week 1:  PT Short Term Goal 1 (Week 1): Pt will transfer bed<>chair w/ min assist PT Short Term Goal 1 - Progress (Week 1): Met PT Short Term Goal 2 (Week 1): Pt will ambulate 25' w/ min assist w/ LRAD PT Short Term Goal 2 - Progress (Week 1): Met PT Short Term Goal 3 (Week 1): Pt will initiate stair training PT Short Term Goal 3 - Progress (Week 1): Met Week 2:  PT Short Term Goal 1 (Week 2): STG=LTG due to LOS Week 3:     Skilled Therapeutic Interventions/Progress Updates:     PAIN 8/10 w/gait/brief sharp pain, treatment to tolerance, mat therex to increase ROM/decrease pain   Pt initially oob in wc and agreable to session.   Pt transported to gym for session.  Attempted STS from wc but p[t c/o pain/stiffness R hip.   Pt agreed to NuStep for ROM/warm up. STS w/min assist and spt to NuStep w/cues to increase UE use to offset wbing/pain w/transfer. Pt requires assist for set up/positioining on NuStep NuStep x 10 min for purpose of ROM to L hip/warm up for gait. Sit to stand from Nustep w/CGA and cues.   Gait 9f w/min assist and cues to offset RLE wbing/pain w/increaseed UE wbing w/stance RLE, one episode of sharp pain w/buckiling at knees but only min assist required for balance.  Pt able to recall 1/3 hip precautions, reviewed w/pt.  SPT wc to mat w/min assist w/RW.  Sit to supine w/mod assist for LE management.  Supine therex: Clamshells x 20 TKEs x 15 Heel slides AAROM RLE x 15 Hip abd/add w/sliding board and pillowcase under heel to decrease friction x 15  Seated LAQs x 15  SPT to wc w/RW and min assist to cga, cues. Pt transported to room. wc to bed w/min to cga w/RW. Sit to supine w mod assist for LE mgmt. Pt left supine w/rails up  x 3, alarm set, bed in lowest position, and needs in reach.  Limited today by pain w/wbing.  Pt requires additional time w/all mobility activities and is unable to recall hip precautions consistently.  Responded well to mat therex for ROM/strengh/pain mgmt.     Therapy Documentation Precautions:  Precautions Precautions: Posterior Hip Precaution Booklet Issued: Yes (comment) Precaution Comments: Unable to recall.  All THP reviewed Restrictions Weight Bearing Restrictions: Yes RLE Weight Bearing: Weight bearing as tolerated    Therapy/Group: Individual Therapy  BAllis Quirarte PIslandia5/04/2020, 4:08 PM

## 2019-11-09 NOTE — Progress Notes (Signed)
Slept good. PRN robaxin & ultram given together at 2230 & 0556, much better with managing pain. C/O spasms to right thigh. Anxious with transfers. SOB with activity. O2 at 2L/M via [place]. RLE edema. Dressing in place to right hip. Stephanie Cortez A

## 2019-11-09 NOTE — Progress Notes (Signed)
Physical Therapy Session Note  Patient Details  Name: FATIN BACHICHA MRN: [id number] Date of Birth: [birth year]  Today's Date: 11/09/2019 PT Individual Time: 1030-1113 PT Individual Time Calculation (min): 43 min   Short Term Goals: Week 2:  PT Short Term Goal 1 (Week 2): STG=LTG due to LOS  Skilled Therapeutic Interventions/Progress Updates:    Patient received up in [facility] reporting significant pain but also that she had been premedicated, willing to participate in therapy. Verbally reviewed/visually demonstrated posterior hip precautions, then transported her totalA in [place] due to pain. Attempted sit to stand in gym but patient ended up scooting her hips so far forward and with such a significant posterior lean back that she was in imminent danger of sliding off edge of WC seat/cushion and was unable to correct herself, required assist of +2 to scoot back to safety in the chair. Returned to her room and practiced standing mechanics as well as pre-gait exercises and weight shifting at the sink. Very limited by pain today and fatigued by just these activities, required extended time  And high levels of encouragement for all task performance today. Left up in chair with nursing staff aware of request for back to bed, all needs otherwise met this morning.   Therapy Documentation Precautions:  Precautions Precautions: Posterior Hip Precaution Booklet Issued: Yes (comment) Precaution Comments: Unable to recall.  All THP reviewed Restrictions Weight Bearing Restrictions: Yes RLE Weight Bearing: Weight bearing as tolerated Pain: Pain Assessment Pain Scale: 0-10 Pain Score: 8  Pain Type: Acute pain;Surgical pain Pain Location: Hip Pain Orientation: Right Pain Descriptors / Indicators: Aching;Sharp;Spasm Pain Onset: On-going Patients Stated Pain Goal: 5 Pain Intervention(s): Medication (See eMAR);Repositioned;Environmental changes Multiple Pain Sites: No    Therapy/Group: Individual Therapy    Windell Norfolk, DPT, PN1   Supplemental Physical Therapist [place]    Pager [phone] Acute Rehab Office [phone]    11/09/2019, 12:36 PM

## 2019-11-09 NOTE — Progress Notes (Signed)
[place] PHYSICAL MEDICINE & REHABILITATION PROGRESS NOTE  Subjective/Complaints: Patient seen sitting up in bed this morning.  She states she slept well overnight.  She states she had some hip pain over the weekend, but has not tried a heating pad.  ROS: Denies CP, SOB, N/V/D  Objective: Vital Signs: Blood pressure 121/63, pulse 70, temperature 98.2 F (36.8 C), temperature source Oral, resp. rate 18, height 5\' 6"  (1.676 m), weight 81.3 kg, SpO2 92 %. No results found. Recent Labs    11/08/19 0520 11/09/19 0532  WBC 10.5 10.1  HGB 10.9* 10.9*  HCT 35.4* 34.8*  PLT 315 349   Recent Labs    11/09/19 1020  NA 131*  K 4.3  CL 97*  CO2 23  GLUCOSE 108*  BUN 15  CREATININE 0.70  CALCIUM 9.2    Physical Exam: BP 121/63 (BP Location: Left Arm)   Pulse 70   Temp 98.2 F (36.8 C) (Oral)   Resp 18   Ht 5\' 6"  (1.676 m)   Wt 81.3 kg   LMP  (LMP Unknown)   SpO2 92%   BMI 28.93 kg/m   Constitutional: No distress . Vital signs reviewed. HENT: Normocephalic.  Atraumatic. Eyes: EOMI. No discharge. Cardiovascular: No JVD. Respiratory: Normal effort.  No stridor. GI: Non-distended. Skin: Right hip incision C/D/I Psych: Normal mood.  Normal behavior. Musc: Right hip with edema and tenderness, improving Neurological: Alert Motor: Motor: Left lower extremity: 4+/5 hip flexion, knee extension, 4+/5 ankle dorsiflexion, unchanged Right lower extremity: Hip flexion 2/5 limited by pain, knee extension 3 +/5, ankle dorsiflexion 4/5, unchanged  Assessment/Plan: 1. Functional deficits secondary to right hip fracture status post right hip hemiarthroplasty which require 3+ hours per day of interdisciplinary therapy in a comprehensive inpatient rehab setting.  Physiatrist is providing close team supervision and 24 hour management of active medical problems listed below.  Physiatrist and rehab team continue to assess barriers to discharge/monitor patient progress toward functional  and medical goals  Care Tool:  Bathing    Body parts bathed by patient: Right arm, Left arm, Chest, Abdomen, Front perineal area, Right upper leg, Left upper leg, Face   Body parts bathed by helper: Buttocks     Bathing assist Assist Level: Minimal Assistance - Patient > 75%     Upper Body Dressing/Undressing Upper body dressing   What is the patient wearing?: Bra, Pull over shirt    Upper body assist Assist Level: Minimal Assistance - Patient > 75%    Lower Body Dressing/Undressing Lower body dressing      What is the patient wearing?: Incontinence brief, Pants     Lower body assist Assist for lower body dressing: Minimal Assistance - Patient > 75%     Toileting Toileting    Toileting assist Assist for toileting: Minimal Assistance - Patient > 75%     Transfers Chair/bed transfer  Transfers assist  Chair/bed transfer activity did not occur: Safety/medical concerns  Chair/bed transfer assist level: Moderate Assistance - Patient 50 - 74%     Locomotion Ambulation   Ambulation assist   Ambulation activity did not occur: Safety/medical concerns  Assist level: Contact Guard/Touching assist Assistive device: Walker-rolling Max distance: 60(total of 100'; rest breaks standing at 60', 80'.)   Walk 10 feet activity   Assist  Walk 10 feet activity did not occur: Safety/medical concerns  Assist level: Minimal Assistance - Patient > 75% Assistive device: Walker-rolling   Walk 50 feet activity   Assist Walk 50 feet  with 2 turns activity did not occur: Safety/medical concerns  Assist level: Minimal Assistance - Patient > 75% Assistive device: Walker-rolling    Walk 150 feet activity   Assist Walk 150 feet activity did not occur: Safety/medical concerns  Assist level: Minimal Assistance - Patient > 75% Assistive device: Walker-rolling    Walk 10 feet on uneven surface  activity   Assist Walk 10 feet on uneven surfaces activity did not occur:  Safety/medical concerns         Wheelchair     Assist Will patient use wheelchair at discharge?: Yes Type of Wheelchair: Manual    Wheelchair assist level: Supervision/Verbal cueing Max wheelchair distance: 150    Wheelchair 50 feet with 2 turns activity    Assist        Assist Level: Supervision/Verbal cueing   Wheelchair 150 feet activity     Assist     Assist Level: Supervision/Verbal cueing      Medical Problem List and Plan: 1.  Decreased endurance, dizziness with activity, problems with sequencing and poor safety awareness secondary to right hip fracture s/p right hip hemiarthroplasty.  Continue CIR 2.  Antithrombotics: -DVT/anticoagulation:  Pharmaceutical: Coumadin, goal 2-3  INR supratherapeutic on 5/10             -antiplatelet therapy: N/A 3. Pain Management: Continue tramadol qid with tylenol prn.  Added ice prn for local measures.   Oxycodone DC'd due to cognitive side effects.  K pad added. Can use ice too  Controlled with meds/interventions on 5/10             Monitor with increased exertion 4. Mood: LCSW to follow for evaluation and support.              -antipsychotic agents: N/A 5. Neuropsych: This patient is ?fully capable of making decisions on her own behalf.  TSH WNL  Home Fluoxetine started on 5/6  Patient will require consistent team support 6. Skin/Wound Care: Monitor wound daily for healing.  7. Fluids/Electrolytes/Nutrition: Monitor I/O.   8. Hyponatremia: Acute on chronic --baseline 130. Will monitor with strict I/Os   Sodium 131 on 5/10  Follow up again later in week 9. CAF: Monitor HR tid-on Cardizem, Zebata and coumadin.   Cardizem increased to 180 on 5/7  Rate controlled on 5/10             Monitor with increased activity. 10. Chronic diastolic CHF: I/Os.  Continue irbesartan, Zebeta and pravachol.   Dose adjustments per cards [facility] Weights   11/02/19 0402 11/04/19 0438 11/05/19 0544  Weight: 84.6 kg 81.3 kg  81.3 kg   Stable/decreased on 5/6, no repeat weights since that time r  11. Bronchiectasis/COPD: On Dulera bid             Weaned supplemental daytime oxygen, continue supplemental oxygen nightly 13. Reactive leucocytosis: Resolved 14. Pre-renal azotemia: Resolved 15. GERD/cricopharyngeal achalasia: Resumed PPI. Needs to be upright for meals.  16. Drug induced constipation: Added senna to colace as had not had BM since admission.              Increase bowel meds  Improving 17.  Essential hypertension  Well controlled 5/10  See #9 18.  Moderate hypoalbuminemia  Supplement initiated on 5/1 19.  Transaminitis: Resolved  LFTs elevated within normal limits on 5/7  Continue to monitor 20.  Acute blood loss anemia  Hemoglobin up to 10.7 on 5/7 21.  Vasovagal episode  See #10  No further work-up  per pulmonology after review of chest x-ray -follow-up as outpatient.  Resolved 22.  Hypoalbuminemia  Supplement initiated on 5/7    LOS: 10 days A FACE TO FACE EVALUATION WAS PERFORMED  Harles Evetts [name] 11/09/2019, 11:39 AM

## 2019-11-10 ENCOUNTER — Inpatient Hospital Stay (HOSPITAL_COMMUNITY): Payer: Medicare Other | Admitting: Occupational Therapy

## 2019-11-10 ENCOUNTER — Inpatient Hospital Stay (HOSPITAL_COMMUNITY): Payer: Medicare Other

## 2019-11-10 DIAGNOSIS — D72829 Elevated white blood cell count, unspecified: Secondary | ICD-10-CM

## 2019-11-10 LAB — CBC
HCT: 36.5 % (ref 36.0–46.0)
Hemoglobin: 11.5 g/dL — ABNORMAL LOW (ref 12.0–15.0)
MCH: 29.9 pg (ref 26.0–34.0)
MCHC: 31.5 g/dL (ref 30.0–36.0)
MCV: 94.8 fL (ref 80.0–100.0)
Platelets: 383 10*3/uL (ref 150–400)
RBC: 3.85 MIL/uL — ABNORMAL LOW (ref 3.87–5.11)
RDW: 13.7 % (ref 11.5–15.5)
WBC: 10.7 10*3/uL — ABNORMAL HIGH (ref 4.0–10.5)
nRBC: 0 % (ref 0.0–0.2)

## 2019-11-10 LAB — PROTIME-INR
INR: 2.6 — ABNORMAL HIGH (ref 0.8–1.2)
Prothrombin Time: 27.2 seconds — ABNORMAL HIGH (ref 11.4–15.2)

## 2019-11-10 MED ORDER — WARFARIN 1.25 MG HALF TABLET
1.2500 mg | ORAL_TABLET | ORAL | Status: DC
  Administered 2019-11-10: 1.25 mg via ORAL
  Filled 2019-11-10 (×2): qty 1

## 2019-11-10 MED ORDER — METHOCARBAMOL 500 MG PO TABS
500.0000 mg | ORAL_TABLET | Freq: Four times a day (QID) | ORAL | Status: DC
  Administered 2019-11-10 – 2019-11-11 (×5): 500 mg via ORAL
  Filled 2019-11-10 (×5): qty 1

## 2019-11-10 MED ORDER — LIVING BETTER WITH HEART FAILURE BOOK: Freq: Once | Status: AC

## 2019-11-10 MED ORDER — WARFARIN SODIUM 2.5 MG PO TABS
2.5000 mg | ORAL_TABLET | ORAL | Status: DC
  Administered 2019-11-11: 2.5 mg via ORAL
  Filled 2019-11-10: qty 1

## 2019-11-10 NOTE — Progress Notes (Signed)
[place] PHYSICAL MEDICINE & REHABILITATION PROGRESS NOTE  Subjective/Complaints: Patient seen sitting up in bed this AM.  She states she slept well overnight.  She states she feels rested this AM.  Discussed muscle spasms limiting therapies with therapies.   ROS: Denies CP, SOB, N/V/D  Objective: Vital Signs: Blood pressure 114/69, pulse 79, temperature 98.5 F (36.9 C), resp. rate 14, height 5\' 6"  (1.676 m), weight 80.8 kg, SpO2 92 %. No results found. Recent Labs    11/09/19 0532 11/10/19 0500  WBC 10.1 10.7*  HGB 10.9* 11.5*  HCT 34.8* 36.5  PLT 349 383   Recent Labs    11/09/19 1020  NA 131*  K 4.3  CL 97*  CO2 23  GLUCOSE 108*  BUN 15  CREATININE 0.70  CALCIUM 9.2    Physical Exam: BP 114/69 (BP Location: Right Arm)   Pulse 79   Temp 98.5 F (36.9 C)   Resp 14   Ht 5\' 6"  (1.676 m)   Wt 80.8 kg   LMP  (LMP Unknown)   SpO2 92%   BMI 28.75 kg/m  Constitutional: No distress . Vital signs reviewed. HENT: Normocephalic.  Atraumatic. Eyes: EOMI. No discharge. Cardiovascular: No JVD. Respiratory: Normal effort.  No stridor. GI: Non-distended. Skin: Right hip incision with dressing C/D/i Psych: Tangential at times (appears to be baseline) Musc: Right hip with improving edema and tenderness Neurological: Alert Motor: Motor: Left lower extremity: 4+/5 hip flexion, knee extension, 4+/5 ankle dorsiflexion, unchanged Right lower extremity: Hip flexion 2/5 limited by pain, knee extension 3 +/5, ankle dorsiflexion 4/5, stable  Assessment/Plan: 1. Functional deficits secondary to right hip fracture status post right hip hemiarthroplasty which require 3+ hours per day of interdisciplinary therapy in a comprehensive inpatient rehab setting.  Physiatrist is providing close team supervision and 24 hour management of active medical problems listed below.  Physiatrist and rehab team continue to assess barriers to discharge/monitor patient progress toward functional  and medical goals  Care Tool:  Bathing    Body parts bathed by patient: Right arm, Left arm, Chest, Abdomen, Front perineal area, Right upper leg, Left upper leg, Face   Body parts bathed by helper: Buttocks     Bathing assist Assist Level: Minimal Assistance - Patient > 75%     Upper Body Dressing/Undressing Upper body dressing   What is the patient wearing?: Bra, Pull over shirt    Upper body assist Assist Level: Minimal Assistance - Patient > 75%    Lower Body Dressing/Undressing Lower body dressing      What is the patient wearing?: Incontinence brief, Pants     Lower body assist Assist for lower body dressing: Minimal Assistance - Patient > 75%     Toileting Toileting    Toileting assist Assist for toileting: Minimal Assistance - Patient > 75%     Transfers Chair/bed transfer  Transfers assist  Chair/bed transfer activity did not occur: Safety/medical concerns  Chair/bed transfer assist level: Minimal Assistance - Patient > 75%     Locomotion Ambulation   Ambulation assist   Ambulation activity did not occur: Safety/medical concerns  Assist level: Minimal Assistance - Patient > 75% Assistive device: Walker-rolling Max distance: 25   Walk 10 feet activity   Assist  Walk 10 feet activity did not occur: Safety/medical concerns  Assist level: Minimal Assistance - Patient > 75% Assistive device: Walker-rolling   Walk 50 feet activity   Assist Walk 50 feet with 2 turns activity did not occur: Safety/medical  concerns  Assist level: Minimal Assistance - Patient > 75% Assistive device: Walker-rolling    Walk 150 feet activity   Assist Walk 150 feet activity did not occur: Safety/medical concerns  Assist level: Minimal Assistance - Patient > 75% Assistive device: Walker-rolling    Walk 10 feet on uneven surface  activity   Assist Walk 10 feet on uneven surfaces activity did not occur: Safety/medical concerns          Wheelchair     Assist Will patient use wheelchair at discharge?: Yes Type of Wheelchair: Manual    Wheelchair assist level: Supervision/Verbal cueing Max wheelchair distance: 150    Wheelchair 50 feet with 2 turns activity    Assist        Assist Level: Supervision/Verbal cueing   Wheelchair 150 feet activity     Assist     Assist Level: Supervision/Verbal cueing      Medical Problem List and Plan: 1.  Decreased endurance, dizziness with activity, problems with sequencing and poor safety awareness secondary to right hip fracture s/p right hip hemiarthroplasty.  Continue CIR 2.  Antithrombotics: -DVT/anticoagulation:  Pharmaceutical: Coumadin, goal 2-3  INR therapeutic on 5/11             -antiplatelet therapy: N/A 3. Pain Management:   Tramadol prn  Tylenol prn.  Added ice prn for local measures.   Oxycodone DC'd due to cognitive side effects.  K pad added. Can use ice too  Robaxin scheduled on 5/11             Monitor with increased exertion 4. Mood: LCSW to follow for evaluation and support.              -antipsychotic agents: N/A 5. Neuropsych: This patient is ?fully capable of making decisions on her own behalf.  TSH WNL  Home Fluoxetine started on 5/6  Patient will require consistent team support 6. Skin/Wound Care: Monitor wound daily for healing.  7. Fluids/Electrolytes/Nutrition: Monitor I/O.   8. Hyponatremia: Acute on chronic --baseline 130. Will monitor with strict I/Os   Sodium 131 on 5/10  Follow up again later in week 9. CAF: Monitor HR tid-on Cardizem, Zebata and coumadin.   Cardizem increased to 180 on 5/7  Rate controlled on 5/11             Monitor with increased activity. 10. Chronic diastolic CHF: I/Os.  Continue irbesartan, Zebeta and pravachol.   Dose adjustments per cards [facility] Weights   11/04/19 0438 11/05/19 0544 11/10/19 0455  Weight: 81.3 kg 81.3 kg 80.8 kg   Stable on 5/11 11. Bronchiectasis/COPD: On Dulera bid              Weaned supplemental daytime oxygen, continue supplemental oxygen nightly 13. Reactive leucocytosis: Resolved 14. Pre-renal azotemia: Resolved 15. GERD/cricopharyngeal achalasia: Resumed PPI. Needs to be upright for meals.  16. Drug induced constipation: Added senna to colace as had not had BM since admission.              Increase bowel meds  Improving 17.  Essential hypertension  Controlled on 5/11  See #9 18.  Moderate hypoalbuminemia  Supplement initiated on 5/1 19.  Transaminitis: Resolved  LFTs elevated within normal limits on 5/7  Continue to monitor 20.  Acute blood loss anemia  Hemoglobin up to 11.5 on 5/11 21.  Vasovagal episode  See #10  No further work-up per pulmonology after review of chest x-ray -follow-up as outpatient.  Resolved 22.  Hypoalbuminemia  Supplement initiated on 5/7  23. Leukocytosis  WBCs 10.7 on 5/11, labs ordered for tomorrow  Afebrile  LOS: 11 days A FACE TO FACE EVALUATION WAS PERFORMED  [name] [name] 11/10/2019, 11:28 AM

## 2019-11-10 NOTE — Progress Notes (Signed)
Occupational Therapy Session Note  Patient Details  Name: Stephanie Cortez MRN: [id number] Date of Birth: 1934-01-09  Today's Date: 11/10/2019 OT Individual Time: [id number] OT Individual Time Calculation (min): 77 min    Short Term Goals: Week 2:  OT Short Term Goal 1 (Week 2): STG = LTGs due to remaining LOS  Skilled Therapeutic Interventions/Progress Updates:    Treatment session with focus on functional transfers, sit > stand, and LB self-care with use of AE.  Pt received supine in bed reporting spasms in RLE overnight.  Pt completed bed mobility with mod assist and multimodal cues for sequencing with mobility.  Mod assist sit > stand from EOB with facilitation for weight shift.  Pt completed stand pivot transfer bed > w/c > toilet with Min assist with RW when transferring to w/c with cues for stepping pattern and then use of grab bar in bathroom.  Pt able to complete clothing management prior to toileting and perineal hygiene. Therapist assisted with hygiene post BM for thoroughness.  Engaged in bathing and dressing at sit > stand level at sink.  Therapist assisted with washing BLE due to hip precautions.  Issued long handled sponge to utilize during future bathing session.  Pt donned pants with mod assist requiring cues and setup for use of reacher this session. Mod assist sit <> stand throughout session due to pain and hesitancy to put too much weight through RLE.  Pt remained upright in w/c with all needs in reach.  Therapy Documentation Precautions:  Precautions Precautions: Posterior Hip Precaution Booklet Issued: Yes (comment) Precaution Comments: Unable to recall.  All THP reviewed Restrictions Weight Bearing Restrictions: Yes RLE Weight Bearing: Weight bearing as tolerated Pain:  Pt reports pain 7/10 in Rt hip.  Premedicated.  Educated on improved positioning to alleviate pain.   Therapy/Group: Individual Therapy  Simonne Come 11/10/2019, 12:14 PM

## 2019-11-10 NOTE — Progress Notes (Signed)
Physical Therapy Session Note  Patient Details  Name: Stephanie Cortez MRN: [id number] Date of Birth: 07/06/1933  Today's Date: 11/10/2019 PT Individual Time: 1100-1157 PT Individual Time Calculation (min): 57 min   Short Term Goals: Week 1:  PT Short Term Goal 1 (Week 1): Pt will transfer bed<>chair w/ min assist PT Short Term Goal 1 - Progress (Week 1): Met PT Short Term Goal 2 (Week 1): Pt will ambulate 25' w/ min assist w/ LRAD PT Short Term Goal 2 - Progress (Week 1): Met PT Short Term Goal 3 (Week 1): Pt will initiate stair training PT Short Term Goal 3 - Progress (Week 1): Met Week 2:  PT Short Term Goal 1 (Week 2): STG=LTG due to LOS  Skilled Therapeutic Interventions/Progress Updates:   Received pt supine in bed, pt agreeable to therapy, and reported pain 6/10 in R hip along incision. RN made aware and administered pain medication at end of session. Repositioning, rest breaks, and distraction done to reduce pain. Session focused on functional mobility/transfers, LE strength, dynamic standing balance/coordination, ambulation, and improved activity tolerance. Pt reported urge to use restroom and ambulated 22f with RW min/mod A to bathroom with increased time. Noted pt with R hip ER with gait and pt unable to maintain toes pointes forward with cues. Pt required standing rest break when ambulating to bathroom due to increased fatigue. Pt required max A to doff pants/brief and able to void and have small BM. Pt with increased breath sounds and when asked if she felt SOB stated "yes". O2 sat 94% and pt reported decreased symptoms with pursed lip breathing techniques. Pt reported feeling lightheaded. BP sitting on commode 107/60 and HR 68bpm. RN made aware. Doffed pants and donned new pair due to pt's original pants being too tight. Pt transferred sit<>stand mod A and required max A to pull pants/brief over hips. Stand<>pivot back to WC. Pt again with labored breathing; which resolved with  education for pursed lip breathing. O2 sat 95% on RW. Pt washed hands seated in WC at sink with supervision. Pt required multiple rest/water breaks throughout session due to increased fatigue. Pt performed the following exercises with cues for technique: -standing heel raises x10 bilaterally with UE support on sink CGA -hip abduction x2 reps on L LE (pt with increased pain and weakness and stopped exercise) with UE support on sink CGA -seated LAQ x 10 bilaterally (decreased ROM on R LE) -attempted supine heel slides however pt with increased pain on R LE and significantly decreased ROM; able to perform x10 reps on L LE. Pt transferred sit<>supine with mod A for LE management. Concluded session with pt supine in bed, needs within reach, and bed alarm on.   Therapy Documentation Precautions:  Precautions Precautions: Posterior Hip Precaution Booklet Issued: Yes (comment) Precaution Comments: Unable to recall.  All THP reviewed Restrictions Weight Bearing Restrictions: Yes RLE Weight Bearing: Weight bearing as tolerated  Therapy/Group: Individual Therapy AAlfonse AlpersPT, DPT   11/10/2019, 7:38 AM

## 2019-11-10 NOTE — Discharge Instructions (Signed)
Inpatient Rehab Discharge Instructions  Stephanie Cortez Discharge date and time:    Activities/Precautions/ Functional Status: Activity: no lifting, driving, or strenuous exercise tilll cleared by MD.  Diet: regular diet Wound Care: keep wound clean and dry   Functional status:  ___ No restrictions     ___ Walk up steps independently ___ 24/7 supervision/assistance   ___ Walk up steps with assistance ___ Intermittent supervision/assistance  ___ Bathe/dress independently ___ Walk with walker     ___ Bathe/dress with assistance ___ Walk Independently    ___ Shower independently ___ Walk with assistance    ___ Shower with assistance ___ No alcohol     ___ Return to work/school ________ COMMUNITY REFERRALS UPON DISCHARGE:    Home Health:   PT     OT     ST                       Agency:[facility] [phone]    Medical Equipment/Items Ordered:                                                 Agency/Supplier:   Special Instructions: 1. Continue right total hip precautions.    My questions have been answered and I understand these instructions. I will adhere to these goals and the provided educational materials after my discharge from the hospital.  Patient/Caregiver Signature _______________________________ Date __________  Clinician Signature _______________________________________ Date __________  Please bring this form and your medication list with you to all your follow-up doctor's appointments.

## 2019-11-10 NOTE — Progress Notes (Signed)
Occupational Therapy Session Note  Patient Details  Name: Stephanie Cortez MRN: [id number] Date of Birth: 05/31/1934  Today's Date: 11/10/2019 OT Individual Time: 0215-0330 OT Individual Time Calculation (min): 75 min    Short Term Goals: Week 2:  OT Short Term Goal 1 (Week 2): STG = LTGs due to remaining LOS  Skilled Therapeutic Interventions/Progress Updates:  Pt received from NT having just finished toileting with pt agreeable to OT intervention. Extensive education provided on posterior hip precautions and how to incorporate precautions functionally into ADLs. Issued pt reacher bag and walker bag and provided education of functional advantages of utilizing AE. Remainder of session focus on household distance functional mobility with RW in room. Overall, pt requires MOD A and MAX cues for hand and BLE placement during each sit<>stand trial and MIN A for functional mobility with RW. Pt benefits from step by step cues to facilitate gait pattern. Pt able to utilize reacher to gather wash cloths on floor but required 3 seated rest breaks in between ~ 3 ft of mobility. Pts daughter enter at end of session with pt unable to state posterior precautions to daughter despite previous education. Pt visibly SOB during session with O2 briefly dropping to 87%  needing seated rest break and pursed lip breathing to rebound O2 back to >90%. Pt required MOD A to return to supine needing most assist to elevate BLEs back to bed. Education provided on hooking LLE underneath RLE to elevate BLEs back to bed. Pt left supine in bed with bed alarm activated, daughter present and all needs within reach.   Therapy Documentation Precautions:  Precautions Precautions: Posterior Hip Precaution Booklet Issued: Yes (comment) Precaution Comments: Unable to recall.  All THP reviewed Restrictions Weight Bearing Restrictions: Yes RLE Weight Bearing: Weight bearing as tolerated General:   Vital Signs: Therapy Vitals Temp:  98.6 F (37 C) Temp Source: Oral Pulse Rate: (!) 59 Resp: (!) 21 BP: 132/64 Patient Position (if appropriate): Lying Oxygen Therapy SpO2: 91 % O2 Device: Room Air Pain: Pt reports 7/10 pain in R hip with RN entering to provide pain meds during session.   Therapy/Group: Individual Therapy  Ihor Gully 11/10/2019, 4:09 PM

## 2019-11-10 NOTE — Progress Notes (Signed)
ANTICOAGULATION CONSULT NOTE - Follow Up Consult  Pharmacy Consult for warfarin Indication: atrial fibrillation, s/p R THA   Patient Measurements: Height: 5\' 6"  (167.6 cm) Weight: 80.8 kg (178 lb 2.1 oz) IBW/kg (Calculated) : 59.3  Vital Signs: Temp: 98.5 F (36.9 C) (05/11 0431) BP: 114/69 (05/11 0500) Pulse Rate: 79 (05/11 0500)  Labs: Recent Labs    11/08/19 0520 11/08/19 0520 11/09/19 0532 11/09/19 1020 11/10/19 0500  HGB 10.9*   < > 10.9*  --  11.5*  HCT 35.4*  --  34.8*  --  36.5  PLT 315  --  349  --  383  LABPROT 28.7*  --  32.1*  --  27.2*  INR 2.8*  --  3.3*  --  2.6*  CREATININE  --   --   --  0.70  --    < > = values in this interval not displayed.    Estimated Creatinine Clearance: 56.1 mL/min (by C-G formula based on SCr of 0.7 mg/dL).  Assessment: 86 year old female with past medical history significant for HF, HTN, probable ILD. Patient on warfarin PTA for hx of atrial fibrillation. Pt admitted for fall d/t hip fracture, underwent right hip arthroplasty on 4/28. Pharmacy consulted 10/30/19 to resume warfarin dosing post-op.  PTA warfarin dose = 1.25 mg on MWF, 2.5 mg all other days    INR 2.6 after holding warfarin yesterday. Eating 30-75%. No reported bleeding. Will schedule 1.25mg  TTSS and 2.5mg  MWF given supratherapeutic INR on home dose. Decrease lab frequency given overall stability.  Goal of Therapy:  INR 2-3  Plan:  -Warfarin scheduled 1.25mg  TTSS and 2.5mg  MWF  -Monitr Q MWF INR; Q Monday CBC -Monitor for signs and symptoms of bleed   Benetta Spar, PharmD, BCPS, BCCP Clinical Pharmacist  Please check AMION for all [place] phone numbers After 10:00 PM, call [place]

## 2019-11-11 ENCOUNTER — Inpatient Hospital Stay (HOSPITAL_COMMUNITY): Payer: Medicare Other | Admitting: Occupational Therapy

## 2019-11-11 ENCOUNTER — Inpatient Hospital Stay (HOSPITAL_COMMUNITY): Payer: Medicare Other

## 2019-11-11 DIAGNOSIS — J449 Chronic obstructive pulmonary disease, unspecified: Secondary | ICD-10-CM

## 2019-11-11 DIAGNOSIS — D72829 Elevated white blood cell count, unspecified: Secondary | ICD-10-CM

## 2019-11-11 LAB — CBC
HCT: 37.2 % (ref 36.0–46.0)
Hemoglobin: 11.9 g/dL — ABNORMAL LOW (ref 12.0–15.0)
MCH: 30.7 pg (ref 26.0–34.0)
MCHC: 32 g/dL (ref 30.0–36.0)
MCV: 95.9 fL (ref 80.0–100.0)
Platelets: 405 10*3/uL — ABNORMAL HIGH (ref 150–400)
RBC: 3.88 MIL/uL (ref 3.87–5.11)
RDW: 13.8 % (ref 11.5–15.5)
WBC: 10.6 10*3/uL — ABNORMAL HIGH (ref 4.0–10.5)
nRBC: 0 % (ref 0.0–0.2)

## 2019-11-11 LAB — PROTIME-INR
INR: 2.7 — ABNORMAL HIGH (ref 0.8–1.2)
Prothrombin Time: 27.6 seconds — ABNORMAL HIGH (ref 11.4–15.2)

## 2019-11-11 MED ORDER — TRAMADOL HCL 50 MG PO TABS: 50.0000 mg | ORAL_TABLET | ORAL | Status: AC

## 2019-11-11 MED ORDER — TRAMADOL HCL 50 MG PO TABS
25.0000 mg | ORAL_TABLET | ORAL | Status: DC | PRN
  Administered 2019-11-11 – 2019-11-15 (×15): 50 mg via ORAL
  Filled 2019-11-11 (×15): qty 1

## 2019-11-11 MED ORDER — METHOCARBAMOL 500 MG PO TABS
250.0000 mg | ORAL_TABLET | Freq: Four times a day (QID) | ORAL | Status: DC
  Administered 2019-11-11 – 2019-11-15 (×18): 250 mg via ORAL
  Filled 2019-11-11 (×16): qty 1

## 2019-11-11 MED ORDER — IPRATROPIUM-ALBUTEROL 0.5-2.5 (3) MG/3ML IN SOLN
3.0000 mL | Freq: Four times a day (QID) | RESPIRATORY_TRACT | Status: DC
  Administered 2019-11-11 – 2019-11-12 (×4): 3 mL via RESPIRATORY_TRACT
  Filled 2019-11-11 (×4): qty 3

## 2019-11-11 MED ORDER — TRAMADOL HCL 50 MG PO TABS
25.0000 mg | ORAL_TABLET | Freq: Four times a day (QID) | ORAL | Status: DC | PRN
  Administered 2019-11-11: 50 mg via ORAL
  Filled 2019-11-11 (×2): qty 1

## 2019-11-11 NOTE — Progress Notes (Signed)
Occupational Therapy Session Note  Patient Details  Name: Stephanie Cortez MRN: [id number] Date of Birth: 03/29/1934  Today's Date: 11/11/2019 OT Individual Time: 1400-1500 OT Individual Time Calculation (min): 60 min    Short Term Goals: Week 1:  OT Short Term Goal 1 (Week 1): Pt will thread BLE into pants wiht AE PRN and MIN VC OT Short Term Goal 1 - Progress (Week 1): Met OT Short Term Goal 2 (Week 1): Pt will don socks with AE PRN and MIN VC OT Short Term Goal 2 - Progress (Week 1): Progressing toward goal OT Short Term Goal 3 (Week 1): Pt will complete CM for toileting with MIN A for standing balnace OT Short Term Goal 3 - Progress (Week 1): Met OT Short Term Goal 4 (Week 1): Pt will complete toilet transfer wiht MIN A OT Short Term Goal 4 - Progress (Week 1): Met  Skilled Therapeutic Interventions/Progress Updates:    1:1. Pt received in bed back from xray requesting ice for end of session but does not report pain. Focus of session on toileting, toilet transfers and transfer training. Pt completes supine>sitting EOB with max VC for sequending. Pt able to recall 2/3 hip precautions with review of 3rd. Pt overall requires blocked practice of sit to stand and stand pivot transfers throughotu session beginning with EOB<>w/c and w/c<>toilet with pt requring A for 2/3 steps of toileting. Edu re pulling pants past knees seated prior to stand ing after bladder/bowel movement. Pt completes 10' funcitonal mobility with RW and pt flustered she is unable to recall hand placement for sit<>stand transitions. Pt provided with written sign for external cue for hand placement. Pt able to use sign in following 2 transfers with improved motor planning/safety. All transfers min-CGA with RW. Exited session with pt seated in bed, ice provided and call light in reach, exit alarm on.   Therapy Documentation Precautions:  Precautions Precautions: Posterior Hip Precaution Booklet Issued: Yes  (comment) Precaution Comments: Unable to recall.  All THP reviewed Restrictions Weight Bearing Restrictions: Yes RLE Weight Bearing: Weight bearing as tolerated General:   Vital Signs: Therapy Vitals Temp: 98.1 F (36.7 C) Temp Source: Oral Pulse Rate: 70 Resp: 17 BP: 112/65 Patient Position (if appropriate): Sitting Oxygen Therapy SpO2: 95 % O2 Device: Nasal Cannula Pain:   ADL:   Vision   Perception    Praxis   Exercises:   Other Treatments:     Therapy/Group: Individual Therapy  Tonny Branch 11/11/2019, 4:14 PM

## 2019-11-11 NOTE — Patient Care Conference (Signed)
Inpatient RehabilitationTeam Conference and Plan of Care Update Date: 11/11/2019   Time: 4:52 PM    Patient Name: Stephanie Cortez      Medical Record Number: [id number]  Date of Birth: Oct 17, 1933 Sex: Female         Room/Bed: 4W26C/4W26C-01 Payor Info: Payor: MEDICARE / Plan: MEDICARE PART A AND B / Product Type: *No Product type* /    Admit Date/Time:  10/30/2019  3:56 PM  Primary Diagnosis:  Subcapital fracture of femur [facility])  Patient Active Problem List   Diagnosis Date Noted  . Elevated WBC count   . Chronic obstructive pulmonary disease ([place])   . Essential hypertension   . Vasovagal episode   . Chronic anticoagulation   . Acute blood loss anemia   . Transaminitis   . Hypoalbuminemia due to protein-calorie malnutrition ([place])   . Labile blood pressure   . Drug induced constipation   . Supplemental oxygen dependent   . Postoperative pain   . Subcapital fracture of femur ([place]) 10/30/2019  . Post-operative state   . Prerenal azotemia   . Leukocytosis   . Closed right hip fracture, initial encounter ([place]) 10/27/2019  . Hoarseness 02/19/2018  . Sensorineural hearing loss (SNHL) of both ears 02/19/2018  . Tinnitus, bilateral 02/19/2018  . Cough 09/06/2017  . Cough variant asthma 08/19/2017  . Acute bronchitis 02/20/2016  . Pulmonary air trapping 01/04/2016  . Encounter for preoperative pulmonary examination 01/04/2016  . NSIP (nonspecific interstitial pneumonia) ([place]) 09/26/2015  . Chronic sinusitis 09/26/2015  . ILD (interstitial lung disease) ([place]) 07/12/2014  . Upper airway cough syndrome 09/23/2013  . Encounter for therapeutic drug monitoring 08/11/2013  . Congestive heart failure ([place]) 07/08/2013  . Coronary artery calcification seen on CAT scan 03/15/2013  . Nodule of left lung 03/15/2013  . Orthostatic lightheadedness 02/09/2013  . Situational mixed anxiety and depressive disorder   . Hyponatremia 04/03/2012  . Diverticulitis 01/01/2012  . GERD with stricture  01/01/2012  . Hypothyroid 11/23/2010  . Edema 10/04/2010  . Bronchiectasis ([place])   . HYPERTENSION, BENIGN 10/22/2008  . A-fib [facility])     Expected Discharge Date: Expected Discharge Date: 11/18/19  Team Members Present: Physician leading conference: Dr. Delice Lesch Care Coodinator Present: Nestor Lewandowsky, RN, BSN, CRRN;Christina Sampson Goon, BSW Nurse Present: Isla Pence, RN PT Present: Becky Sax, PT OT Present: Simonne Come, OT PPS Coordinator present : Ileana Ladd, Burna Mortimer, SLP     Current Status/Progress Goal Weekly Team Focus  Bowel/Bladder   Continent of bowel and bladder.  LBM 11/10/19.  Patient will remain continent of bowel and bladder with normal bowel pattern  Scheduled and PRN bowel medication regimen. Q2-3H tolieting   Swallow/Nutrition/ Hydration             ADL's   Min-mod A overall, was improving to overall Min assist with bathing, dressing and transfers 5/7, however pt requiring increased assist due to increased pain in Rt hip 5/10 and 5/11  Supervision  ADL retraining, activity tolerance, transfers, general strengthening, pain management, pt/family education   Mobility   bed mobility min A, transfers min/mod A with RW, gait 9ft with RW min A, 4 steps 1 rail mod A  supervision, min A for stairs  functional mobility/transfers, LE strength, dynamic standing balance/coordination, ambulation, stair navigation, endurance   Communication             Safety/Cognition/ Behavioral Observations            Pain   Right hip pain  with ongoing pain management including scheduled Tylenol and Robaxin, PRN Tramadol Q6H  Patient will tolerate pain level less than 4  Pain assessment Qshift and PRN   Skin   Right hip surgical site well approximated with staples intact.  Dry gauze dressing applied. Scattered subcutaneous bruising BUE/surg site  Patient will remain free of infection and breakdown, maintain skin integrity  Skin assessment Qshift and PRN; dressing  changes per order    Rehab Goals Patient on target to meet rehab goals: Yes Rehab Goals Revised: on target with current goals *See Care Plan and progress notes for long and short-term goals.     Barriers to Discharge  Current Status/Progress Possible Resolutions Date Resolved   Nursing                  PT  Other (comments)  pain, decreased endurance              OT                  SLP                SW Medical stability SW aware of none on target          Discharge Planning/Teaching Needs:  Patient discharging 5/15  will schedule if recommended   Team Discussion:  Reviewed medical issues, delayed processing = recheck labs, adjust meds , xray of hip, oxygenation saturations low/CHF exacerbation symptoms, memory issues and slow initiation of action.   Revisions to Treatment Plan:  Rough morning for therapy and decline in functional status - request extension of ELOS as not currently meeting goals for discharge,  and has two steps to entry of home    Medical Summary Current Status: Decreased endurance, dizziness with activity, problems with sequencing and poor safety awareness secondary to right hip fracture s/p right hip hemiarthroplasty. Weekly Focus/Goal: Improve mobility, muscle spasms, HTN, CHF, leukocytosis, COPD  Barriers to Discharge: Medical stability;Wound care  Barriers to Discharge Comments: Respiratory issues Possible Resolutions to Barriers: Therapies, follow labs, follow weights, balance pain meds with cognition, duonebs scheduled, xray ordered for hip   Continued Need for Acute Rehabilitation Level of Care: The patient requires daily medical management by a physician with specialized training in physical medicine and rehabilitation for the following reasons: Direction of a multidisciplinary physical rehabilitation program to maximize functional independence : Yes Medical management of patient stability for increased activity during participation in an intensive  rehabilitation regime.: Yes Analysis of laboratory values and/or radiology reports with any subsequent need for medication adjustment and/or medical intervention. : Yes   I attest that I was present, lead the team conference, and concur with the assessment and plan of the team.   Dorien Chihuahua B 11/11/2019, 4:52 PM

## 2019-11-11 NOTE — Progress Notes (Signed)
Physical Therapy Session Note  Patient Details  Name: Stephanie Cortez MRN: [id number] Date of Birth: 05-28-1934  Today's Date: 11/11/2019 PT Individual Time: 0800-0914 PT Individual Time Calculation (min): 74 min   Short Term Goals: Week 1:  PT Short Term Goal 1 (Week 1): Pt will transfer bed<>chair w/ min assist PT Short Term Goal 1 - Progress (Week 1): Met PT Short Term Goal 2 (Week 1): Pt will ambulate 25' w/ min assist w/ LRAD PT Short Term Goal 2 - Progress (Week 1): Met PT Short Term Goal 3 (Week 1): Pt will initiate stair training PT Short Term Goal 3 - Progress (Week 1): Met Week 2:  PT Short Term Goal 1 (Week 2): STG=LTG due to LOS  Skilled Therapeutic Interventions/Progress Updates:   Received pt supine in bed, pt agreeable to therapy, and reported pain 7/10 in R hip along incision. Pt reported she recently received pain medication. Repositioning, distraction, and rest breaks done to reduce pain. Session focused on dressing, toileting, functional mobility/transfers, LE strength, dynamic standing balance/coordination, and improved activity tolerance. Doffed old brief and donned new one with max A. Pt required mod A and use of bedrail to roll to L and R. However, pt with increased pain and inability to completely roll to R side due to incision. Donned pants in supine with max A. Pt transferred supine<>sitting EOB from flat bed with mod A and verbal cues for logroll technique using bedrails. Pt transferred sit<>stand with RW mod A and required max A to adjust pants/brief in standing. Pt transferred bed<>WC with RW mod A. Pt extremly fatigued after transfer and MD present to perform morning rounds and brief assessment; updated MD on pt's current mobility status and concern for pt leaving on Sat. Pt doffed dirty shirt and donned clean one with supervision and increased time while seated in WC. Pt transferred stand<>pivot WC<>toilet with bedside commode over top with RW mod A. Pt required  increased time and cues to lean forward prior to standing, to get feet underneath her, and to use grab bar for assist. Pt required mod A to pull pants/brief down and able to void and have small BM. Pt with increased difficulty standing from commode requiring multiple attempts. When pt attempted to stand, was unable to straighten her knees, unable to shift weight anteriorly, and required max cues to keep feet on floor and underneath her; returned to sitting position for safety. Pt transferred sit<>stand from toilet with RW mod A and transferred stand<>pivot to WC. O2 sat 89% on RA increasing to 93% with education for pursed lip breathing. Pt with increased difficulty scooting hips back in chair requiring mod A and cues for anterior weight shifting, use of WC armrests, and to get feet back underneath her x 3 trials throughout session. Pt required multiple extended rest/water breaks throughout session due to increased fatigue. Pt washed hands seated in WC at sink with supervision. Pt transported to therapy gym in [facility] total assist for time management purposes. Pt transferred sit<>stand at staircase with mod A and max verbal cues for anterior weight shifting, scooting forwards, and to keep feet underneath her. Attempted to navigate 2 steps with 1 rail to simulate home environment however, pt unable to lift L LE and with increased difficulty problem solving activity; returned to sitting for safety. O2 sat 83% increasing to 94% on RA with 3 minute seated rest break. Pt transported back to room in [facility] total assist. Concluded session with pt sitting in WC, needs within  reach, and seatbelt alarm on. Therapist provided fresh ice water for pt.  Therapy Documentation Precautions:  Precautions Precautions: Posterior Hip Precaution Booklet Issued: Yes (comment) Precaution Comments: Unable to recall.  All THP reviewed Restrictions Weight Bearing Restrictions: Yes RLE Weight Bearing: Weight bearing as  tolerated  Therapy/Group: Individual Therapy Alfonse Alpers PT, DPT   11/11/2019, 7:25 AM

## 2019-11-11 NOTE — Progress Notes (Signed)
Notified PA of results from xray of right hip

## 2019-11-11 NOTE — Progress Notes (Signed)
ANTICOAGULATION CONSULT NOTE - Follow Up Consult  Pharmacy Consult for warfarin Indication: atrial fibrillation, s/p R THA   Patient Measurements: Height: 5\' 6"  (167.6 cm) Weight: 81 kg (178 lb 9.2 oz) IBW/kg (Calculated) : 59.3  Vital Signs: Temp: 98.1 F (36.7 C) (05/12 0624) Temp Source: Oral (05/12 0624) BP: 141/62 (05/12 0626) Pulse Rate: 68 (05/12 0626)  Labs: Recent Labs    11/09/19 0532 11/09/19 1020 11/10/19 0500 11/11/19 0537  HGB 10.9*  --  11.5*  --   HCT 34.8*  --  36.5  --   PLT 349  --  383  --   LABPROT 32.1*  --  27.2* 27.6*  INR 3.3*  --  2.6* 2.7*  CREATININE  --  0.70  --   --     Estimated Creatinine Clearance: 56.2 mL/min (by C-G formula based on SCr of 0.7 mg/dL).  Assessment: 86 year old female with past medical history significant for HF, HTN, probable ILD. Patient on warfarin PTA for hx of atrial fibrillation. Pt admitted for fall d/t hip fracture, underwent right hip arthroplasty on 4/28. Pharmacy consulted 10/30/19 to resume warfarin dosing post-op.  PTA warfarin dose = 1.25 mg on MWF, 2.5 mg all other days    INR 2.7 at goal. Eating 30-75%. No reported bleeding. Will schedule 1.25mg  TTSS and 2.5mg  MWF given supratherapeutic INR on home dose. Decrease lab frequency given overall stability.  Goal of Therapy:  INR 2-3  Plan:  -Warfarin scheduled 1.25mg  TTSS and 2.5mg  MWF  -Monitr Q MWF INR; Q Monday CBC -Monitor for signs and symptoms of bleeding   Benetta Spar, PharmD, BCPS, BCCP Clinical Pharmacist  Please check AMION for all [place] phone numbers After 10:00 PM, call [place]

## 2019-11-11 NOTE — Progress Notes (Signed)
Team Conference Report to Patient/Family  Team Conference discussion was reviewed with the patient and caregiver, including goals, any changes in plan of care and target discharge date.  Patient and caregiver express understanding and are in agreement.  The patient has a target discharge date of 11/18/19.  Dyanne Iha 11/11/2019, 2:50 PM

## 2019-11-11 NOTE — Plan of Care (Signed)
  Problem: RH Bed Mobility Goal: LTG Patient will perform bed mobility with assist (PT) Description: LTG: Patient will perform bed mobility with assistance, with/without cues (PT). Flowsheets (Taken 11/11/2019 1217) LTG: Pt will perform bed mobility with assistance level of: (downgraded due to LE weakness, decreased core strength, and poor activity tolerance) Contact Guard/Touching assist Note: downgraded due to LE weakness, decreased core strength, and poor activity tolerance

## 2019-11-11 NOTE — Plan of Care (Signed)
  Problem: RH Balance Goal: LTG Patient will maintain dynamic standing balance (PT) Description: LTG:  Patient will maintain dynamic standing balance with assistance during mobility activities (PT) Flowsheets (Taken 11/11/2019 0752) LTG: Pt will maintain dynamic standing balance during mobility activities with:: (downgraded due to decreased balance/postural control, weakness, fatigue) Contact Guard/Touching assist Note: downgraded due to decreased balance/postural control, weakness, fatigue   Problem: Sit to Stand Goal: LTG:  Patient will perform sit to stand with assistance level (PT) Description: LTG:  Patient will perform sit to stand with assistance level (PT) Flowsheets (Taken 11/11/2019 0752) LTG: PT will perform sit to stand in preparation for functional mobility with assistance level: (downgraded due to decreased balance/postural control, weakness, fatigue) Contact Guard/Touching assist Note: downgraded due to decreased balance/postural control, weakness, fatigue   Problem: RH Bed to Chair Transfers Goal: LTG Patient will perform bed/chair transfers w/assist (PT) Description: LTG: Patient will perform bed to chair transfers with assistance (PT). Flowsheets (Taken 11/11/2019 0752) LTG: Pt will perform Bed to Chair Transfers with assistance level: (downgraded due to decreased balance/postural control, weakness, fatigue) Contact Guard/Touching assist Note: downgraded due to decreased balance/postural control, weakness, fatigue   Problem: RH Car Transfers Goal: LTG Patient will perform car transfers with assist (PT) Description: LTG: Patient will perform car transfers with assistance (PT). Flowsheets (Taken 11/11/2019 0752) LTG: Pt will perform car transfers with assist:: (downgraded due to decreased balance/postural control, weakness, fatigue) Contact Guard/Touching assist Note: downgraded due to decreased balance/postural control, weakness, fatigue   Problem: RH Ambulation Goal: LTG  Patient will ambulate in controlled environment (PT) Description: LTG: Patient will ambulate in a controlled environment, # of feet with assistance (PT). Flowsheets (Taken 11/11/2019 0752) LTG: Pt will ambulate in controlled environ  assist needed:: (downgraded due to decreased balance/postural control, weakness, fatigue) Contact Guard/Touching assist LTG: Ambulation distance in controlled environment: 5ft with LRAD Note: downgraded due to decreased balance/postural control, weakness, fatigue Goal: LTG Patient will ambulate in home environment (PT) Description: LTG: Patient will ambulate in home environment, # of feet with assistance (PT). Flowsheets (Taken 11/11/2019 0752) LTG: Pt will ambulate in home environ  assist needed:: (downgraded due to decreased balance/postural control, weakness, fatigue) Contact Guard/Touching assist LTG: Ambulation distance in home environment: 36ft with LRAD Note: downgraded due to decreased balance/postural control, weakness, fatigue

## 2019-11-11 NOTE — Progress Notes (Signed)
Discussed Xray report with Dr. Posey Pronto who has reviewed this with Dr.Yates. Latter to discuss three options with patient. Daughter and son-in-law updated and plan to be here by 7:30 am tomorrow to discuss options with Dr. Lorin Mercy.

## 2019-11-11 NOTE — Progress Notes (Signed)
[facility] PHYSICAL MEDICINE & REHABILITATION PROGRESS NOTE  Subjective/Complaints: Patient seen transferring with therapy this morning.  She states she slept well overnight.  Patient with questions regarding pulmonary follow-up.  She notes muscle spasms.  Discussed discussed function, fatigue, assistance, leg rotation with therapies.  ROS: + Muscle spasms.  Denies CP, SOB, N/V/D  Objective: Vital Signs: Blood pressure (!) 141/62, pulse 68, temperature 98.1 F (36.7 C), temperature source Oral, resp. rate 16, height 5\' 6"  (1.676 m), weight 81 kg, SpO2 (!) 89 %. No results found. Recent Labs    11/09/19 0532 11/10/19 0500  WBC 10.1 10.7*  HGB 10.9* 11.5*  HCT 34.8* 36.5  PLT 349 383   Recent Labs    11/09/19 1020  NA 131*  K 4.3  CL 97*  CO2 23  GLUCOSE 108*  BUN 15  CREATININE 0.70  CALCIUM 9.2    Physical Exam: BP (!) 141/62 (BP Location: Left Arm)   Pulse 68   Temp 98.1 F (36.7 C) (Oral)   Resp 16   Ht 5\' 6"  (1.676 m)   Wt 81 kg   LMP  (LMP Unknown)   SpO2 (!) 89%   BMI 28.82 kg/m  Constitutional: No distress . Vital signs reviewed. Constitutional: No distress . Vital signs reviewed. HENT: Normocephalic.  Atraumatic. Eyes: EOMI. No discharge. Cardiovascular: No JVD. Respiratory: Normal effort.  No stridor.  Expiratory wheezes. GI: Non-distended. Skin: Right hip incision with dressing C/D/I Psych: Tangential at times Musc: Right hip edema and tenderness Neurological: Alert Motor: Motor: Left lower extremity: 4+/5 hip flexion, knee extension, 4+/5 ankle dorsiflexion, stable Right lower extremity: Hip flexion 2/5 limited by pain, knee extension 3 +/5, ankle dorsiflexion 4/5, stable  Assessment/Plan: 1. Functional deficits secondary to right hip fracture status post right hip hemiarthroplasty which require 3+ hours per day of interdisciplinary therapy in a comprehensive inpatient rehab setting.  Physiatrist is providing close team supervision and 24 hour  management of active medical problems listed below.  Physiatrist and rehab team continue to assess barriers to discharge/monitor patient progress toward functional and medical goals  Care Tool:  Bathing    Body parts bathed by patient: Right arm, Left arm, Chest, Abdomen, Front perineal area, Right upper leg, Left upper leg, Face   Body parts bathed by helper: Buttocks, Right lower leg, Left lower leg     Bathing assist Assist Level: Minimal Assistance - Patient > 75%     Upper Body Dressing/Undressing Upper body dressing   What is the patient wearing?: Pull over shirt    Upper body assist Assist Level: Set up assist    Lower Body Dressing/Undressing Lower body dressing      What is the patient wearing?: Incontinence brief, Pants     Lower body assist Assist for lower body dressing: Moderate Assistance - Patient 50 - 74%     Toileting Toileting    Toileting assist Assist for toileting: Minimal Assistance - Patient > 75%     Transfers Chair/bed transfer  Transfers assist  Chair/bed transfer activity did not occur: Safety/medical concerns  Chair/bed transfer assist level: Minimal Assistance - Patient > 75%     Locomotion Ambulation   Ambulation assist   Ambulation activity did not occur: Safety/medical concerns  Assist level: Moderate Assistance - Patient 50 - 74% Assistive device: Walker-rolling Max distance: 38ft   Walk 10 feet activity   Assist  Walk 10 feet activity did not occur: Safety/medical concerns  Assist level: Moderate Assistance - Patient -  50 - 74% Assistive device: Walker-rolling   Walk 50 feet activity   Assist Walk 50 feet with 2 turns activity did not occur: Safety/medical concerns  Assist level: Minimal Assistance - Patient > 75% Assistive device: Walker-rolling    Walk 150 feet activity   Assist Walk 150 feet activity did not occur: Safety/medical concerns  Assist level: Minimal Assistance - Patient > 75% Assistive  device: Walker-rolling    Walk 10 feet on uneven surface  activity   Assist Walk 10 feet on uneven surfaces activity did not occur: Safety/medical concerns         Wheelchair     Assist Will patient use wheelchair at discharge?: Yes Type of Wheelchair: Manual    Wheelchair assist level: Supervision/Verbal cueing Max wheelchair distance: 150    Wheelchair 50 feet with 2 turns activity    Assist        Assist Level: Supervision/Verbal cueing   Wheelchair 150 feet activity     Assist     Assist Level: Supervision/Verbal cueing      Medical Problem List and Plan: 1.  Decreased endurance, dizziness with activity, problems with sequencing and poor safety awareness secondary to right hip fracture s/p right hip hemiarthroplasty.  Continue CIR  Team conference today to discuss current and goals and coordination of care, home and environmental barriers, and discharge planning with nursing, case manager, and therapies.   Follow-up x-ray ordered 2.  Antithrombotics: -DVT/anticoagulation:  Pharmaceutical: Coumadin, goal 2-3  INR therapeutic on 5/12             -antiplatelet therapy: N/A 3. Pain Management:   Tramadol prn  Tylenol prn.  Added ice prn for local measures.   Oxycodone DC'd due to cognitive side effects.  K pad added. Can use ice too  Robaxin scheduled on 5/11, with? improvement             Monitor with increased exertion 4. Mood: LCSW to follow for evaluation and support.              -antipsychotic agents: N/A 5. Neuropsych: This patient is ?fully capable of making decisions on her own behalf.  TSH WNL  Home Fluoxetine started on 5/6  Patient will require consistent team support, discussed anxiety and relaxation with patient 6. Skin/Wound Care: Monitor wound daily for healing.  7. Fluids/Electrolytes/Nutrition: Monitor I/O.   8. Hyponatremia: Acute on chronic --baseline 130. Will monitor with strict I/Os   Sodium 131 on 5/10, plan to order  labs for the end of the week  Follow up again later in week 9. CAF: Monitor HR tid-on Cardizem, Zebata and coumadin.   Cardizem increased to 180 on 5/7  Rate controlled on 5/12             Monitor with increased activity. 10. Chronic diastolic CHF: I/Os.  Continue irbesartan, Zebeta and pravachol.   Dose adjustments per cards Filed Weights   11/05/19 0544 11/10/19 0455 11/11/19 0500  Weight: 81.3 kg 80.8 kg 81 kg   Stable on 5/12 11. Bronchiectasis/COPD: On Dulera bid             Weaned supplemental daytime oxygen, continue supplemental oxygen nightly  DuoNebs scheduled  Bronchoscopy as outpatient per patient 13. Reactive leucocytosis: Resolved 14. Pre-renal azotemia: Resolved 15. GERD/cricopharyngeal achalasia: Resumed PPI. Needs to be upright for meals.  16. Drug induced constipation: Added senna to colace as had not had BM since admission.  Increase bowel meds  Improving 17.  Essential hypertension  Controlled on 5/11  See #9 18.  Moderate hypoalbuminemia  Supplement initiated on 5/1 19.  Transaminitis: Resolved  LFTs elevated within normal limits on 5/7  Continue to monitor 20.  Acute blood loss anemia  Hemoglobin up to 11.5 on 5/11 21.  Vasovagal episode  See #10  No further work-up per pulmonology after review of chest x-ray -follow-up as outpatient.  Resolved 22.  Hypoalbuminemia  Supplement initiated on 5/7  23. Leukocytosis  WBCs 10.7 on 5/11, labs pending  Afebrile  LOS: 12 days A FACE TO FACE EVALUATION WAS PERFORMED   [name] 11/11/2019, 10:40 AM

## 2019-11-11 NOTE — Progress Notes (Signed)
Occupational Therapy Session Note  Patient Details  Name: Stephanie Cortez MRN: [id number] Date of Birth: [birth year]  Today's Date: 11/11/2019 OT Individual Time: [id number] OT Individual Time Calculation (min): 60 min    Short Term Goals: Week 2:  OT Short Term Goal 1 (Week 2): STG = LTGs due to remaining LOS  Skilled Therapeutic Interventions/Progress Updates:    Treatment session with focus on functional transfers and sit <> stand. Pt reporting not doing well during PT session and needing increased assist with sit <> stand and mobility. Pt reports that she continues to be concerned about d/c plans and "gets in her head" which impacts her ability to engage fully in therapy sessions.  Engaged in sit > stand and stand pivot transfers increasing to short distance ambulation.  Pt reports need to toilet.  Completed stand pivot transfer w/c > BSC over toilet with mod assist initially fading to min assist and cues for hand placement.  Cues for stepping pattern during transfer as pt tends to slide her feet instead of weight shifting to step.  Pt completed toileting with min assist to pull up incontinence brief - discussed alternative options for home.  Discussed recommended DME for home. Pt completed stand pivot transfer with improved recall of hand placement and improved anterior weight shift for sit > stand.  Pt completed blocked practice transfer training incorporating ambulating ~10' with RW with CGA and incorporating turning 90* and 180*.  Pt with improved carryover and sequencing with sit <> stand and functional transfers with repetition.  Pt returned to w/c and left with seat belt alarm on and all needs in reach.  Therapy Documentation Precautions:  Precautions Precautions: Posterior Hip Precaution Booklet Issued: Yes (comment) Precaution Comments: Unable to recall.  All THP reviewed Restrictions Weight Bearing Restrictions: Yes RLE Weight Bearing: Weight bearing as tolerated General:    Vital Signs: Oxygen Therapy SpO2: (!) 89 % O2 Device: Room Air Pain: Pain Assessment Pain Scale: 0-10 Pain Score: 10-Worst pain ever Pain Type: Surgical pain Pain Location: Hip Pain Orientation: Right Pain Radiating Towards: from hip to foot Pain Descriptors / Indicators: Aching;Discomfort Pain Frequency: Constant Pain Onset: On-going Pain Intervention(s): Medication (See eMAR)   Therapy/Group: Individual Therapy  Simonne Come 11/11/2019, 12:31 PM

## 2019-11-12 ENCOUNTER — Inpatient Hospital Stay (HOSPITAL_COMMUNITY): Payer: Medicare Other

## 2019-11-12 DIAGNOSIS — M978XXA Periprosthetic fracture around other internal prosthetic joint, initial encounter: Secondary | ICD-10-CM

## 2019-11-12 DIAGNOSIS — Z96649 Presence of unspecified artificial hip joint: Secondary | ICD-10-CM

## 2019-11-12 DIAGNOSIS — J479 Bronchiectasis, uncomplicated: Secondary | ICD-10-CM

## 2019-11-12 MED ORDER — IPRATROPIUM-ALBUTEROL 0.5-2.5 (3) MG/3ML IN SOLN: 3.0000 mL | Freq: Two times a day (BID) | RESPIRATORY_TRACT | Status: DC | PRN

## 2019-11-12 MED ORDER — IPRATROPIUM-ALBUTEROL 0.5-2.5 (3) MG/3ML IN SOLN
3.0000 mL | Freq: Two times a day (BID) | RESPIRATORY_TRACT | Status: DC
  Administered 2019-11-12: 3 mL via RESPIRATORY_TRACT
  Filled 2019-11-12: qty 3

## 2019-11-12 NOTE — Progress Notes (Signed)
Occupational Therapy Session Note  Patient Details  Name: STEVI CALK MRN: [id number] Date of Birth: [birth year]  Today's Date: 11/12/2019 OT Individual Time: 0815-0900 OT Individual Time Calculation (min): 45 min    Short Term Goals: Week 2:  OT Short Term Goal 1 (Week 2): STG = LTGs due to remaining LOS  Skilled Therapeutic Interventions/Progress Updates:    1:1 pt reporting 5/10 pain in R hip- "low." Pt still requesting to complete bathing/ UB dressing this date after discussion of donning pants would be more rolling if pt needed to toilet/use bed pan. Pt agreeable to washing up in bed. Pt rolls to pull shirt back up with A and doffs over head and arms. Pt completes UB bathing, face washing and oral care with set up. Pt provided with shampoo cap and washes hair with VC with BUE for strengthening and endurance. Pt combs hair after OT blow dries. OT uses therapuetic use of self to improve anxiety around medical decisions, discuss QoL and improving pt self advocating for needs. Exited session with pt setaed in bed, exi tlaram on and call light tin reach  Therapy Documentation Precautions:  Precautions Precautions: Posterior Hip Precaution Booklet Issued: Yes (comment) Precaution Comments: Unable to recall.  All THP reviewed Restrictions Weight Bearing Restrictions: Yes RLE Weight Bearing: Weight bearing as tolerated(Bedrest at this time) General:   Vital Signs: Therapy Vitals Temp: 97.7 F (36.5 C) Temp Source: Oral Pulse Rate: 69 Resp: 18 BP: 121/66 Patient Position (if appropriate): Lying Oxygen Therapy SpO2: 97 % O2 Device: Nasal Cannula O2 Flow Rate (L/min): 2 L/min Pain: Pain Assessment Pain Score: Asleep ADL:   Vision   Perception    Praxis   Exercises:   Other Treatments:     Therapy/Group: Individual Therapy  Tonny Branch 11/12/2019, 8:24 AM

## 2019-11-12 NOTE — Progress Notes (Addendum)
ANTICOAGULATION CONSULT NOTE - Follow Up Consult  Pharmacy Consult for warfarin >> heparin Indication: atrial fibrillation, s/p R THA   Patient Measurements: Height: 5\' 6"  (167.6 cm) Weight: 81.5 kg (179 lb 10.8 oz) IBW/kg (Calculated) : 59.3  Vital Signs: Temp: 97.7 F (36.5 C) (05/13 0500) Temp Source: Oral (05/13 0500) BP: 121/66 (05/13 0500) Pulse Rate: 69 (05/13 0500)  Labs: Recent Labs    11/10/19 0500 11/11/19 0537 11/11/19 1149  HGB 11.5*  --  11.9*  HCT 36.5  --  37.2  PLT 383  --  405*  LABPROT 27.2* 27.6*  --   INR 2.6* 2.7*  --     Estimated Creatinine Clearance: 56.4 mL/min (by C-G formula based on SCr of 0.7 mg/dL).  Assessment: 86 year old W on warfarin PTA for hx of atrial fibrillation (CHADS2VASc = 4). Pt admitted for fall d/t hip fracture, underwent right hip arthroplasty on 4/28. Pharmacy consulted 10/30/19 to resume warfarin. Patient planned for orthopedic revision surgery on 5/17 so warfarin was held starting 5/13 and pharmacy consulted to start heparin.   Previously had decreased INR to MWF given stability. Patient received warfarin dose 5/12 so anticipate INR is therapeutic today. Will not give reversal agents given OR is set for 5/17. Will monitor INR daily and start heparin when INR is less than 2.   PTA warfarin dose = 1.25 mg on MWF, 2.5 mg all other days     Goal of Therapy:  Heparin Level 0.3 -0.7 units/mL Antiplatelet monitoring per protocol   Plan:  Hold warfarin Start heparin when INR is less than 2  Monitor daily INR, [id number] CBC/plt Monitor for signs/symptoms of bleeding   Benetta Spar, PharmD, BCPS, BCCP Clinical Pharmacist  Please check AMION for all [place] phone numbers After 10:00 PM, call [place]

## 2019-11-12 NOTE — Progress Notes (Signed)
[facility] PHYSICAL MEDICINE & REHABILITATION PROGRESS NOTE  Subjective/Complaints: Patient seen sitting up in bed this morning with family at bedside.  She states she slept well overnight.  She notes improvement with duo nebs.  Ortho also and to discuss with patient.  ROS: Denies CP, SOB, N/V/D  Objective: Vital Signs: Blood pressure 121/66, pulse 69, temperature 97.7 F (36.5 C), temperature source Oral, resp. rate 18, height 5\' 6"  (1.676 m), weight 81.5 kg, SpO2 97 %. DG HIP UNILAT WITH PELVIS 2-3 VIEWS RIGHT  Result Date: 11/11/2019 CLINICAL DATA:  Right hip pain after fall 3 days ago. EXAM: DG HIP (WITH OR WITHOUT PELVIS) 2-3V RIGHT COMPARISON:  October 28, 2019. FINDINGS: Status post right total hip arthroplasty. Moderately displaced fracture is seen involving a portion of the proximal right femur including the lesser trochanter medially. This is new since prior exam. IMPRESSION: Moderately displaced proximal right femoral fracture is noted including lesser trochanter medially. Status post right total hip arthroplasty. Electronically Signed   By: Marijo Conception M.D.   On: 11/11/2019 14:52   Recent Labs    11/10/19 0500 11/11/19 1149  WBC 10.7* 10.6*  HGB 11.5* 11.9*  HCT 36.5 37.2  PLT 383 405*   Recent Labs    11/09/19 1020  NA 131*  K 4.3  CL 97*  CO2 23  GLUCOSE 108*  BUN 15  CREATININE 0.70  CALCIUM 9.2    Physical Exam: BP 121/66 (BP Location: Left Arm)   Pulse 69   Temp 97.7 F (36.5 C) (Oral)   Resp 18   Ht 5\' 6"  (1.676 m)   Wt 81.5 kg   LMP  (LMP Unknown)   SpO2 97%   BMI 29.00 kg/m  Constitutional: No distress . Vital signs reviewed. Constitutional: No distress . Vital signs reviewed. HENT: Normocephalic.  Atraumatic. Eyes: EOMI. No discharge. Cardiovascular: No JVD. Respiratory: Normal effort.  No stridor.  Bilaterally clear to auscultation. GI: Non-distended. Skin: Right hip with dressing C/D/I Psych: Normal mood.  Normal behavior. Musc: Right  hip with edema and tenderness. Neurological: Alert Motor: Motor: Left lower extremity: 4+/5 hip flexion, knee extension, 4+/5 ankle dorsiflexion, stable Right lower extremity: Hip flexion 2/5 limited by pain, knee extension 3 +/5, ankle dorsiflexion 4/5, unchanged  Assessment/Plan: 1. Functional deficits secondary to right hip fracture status post right hip hemiarthroplasty which require 3+ hours per day of interdisciplinary therapy in a comprehensive inpatient rehab setting.  Physiatrist is providing close team supervision and 24 hour management of active medical problems listed below.  Physiatrist and rehab team continue to assess barriers to discharge/monitor patient progress toward functional and medical goals  Care Tool:  Bathing    Body parts bathed by patient: Right arm, Left arm, Chest, Abdomen, Front perineal area, Right upper leg, Left upper leg, Face   Body parts bathed by helper: Buttocks, Right lower leg, Left lower leg     Bathing assist Assist Level: Minimal Assistance - Patient > 75%     Upper Body Dressing/Undressing Upper body dressing   What is the patient wearing?: Pull over shirt    Upper body assist Assist Level: Set up assist    Lower Body Dressing/Undressing Lower body dressing      What is the patient wearing?: Incontinence brief, Pants     Lower body assist Assist for lower body dressing: Moderate Assistance - Patient 50 - 74%     Toileting Toileting    Toileting assist Assist for toileting: Minimal Assistance -  Patient > 75%     Transfers Chair/bed transfer  Transfers assist  Chair/bed transfer activity did not occur: Safety/medical concerns  Chair/bed transfer assist level: Moderate Assistance - Patient 50 - 74%     Locomotion Ambulation   Ambulation assist   Ambulation activity did not occur: Safety/medical concerns  Assist level: Moderate Assistance - Patient 50 - 74% Assistive device: Walker-rolling Max distance: 94ft    Walk 10 feet activity   Assist  Walk 10 feet activity did not occur: Safety/medical concerns  Assist level: Moderate Assistance - Patient - 50 - 74% Assistive device: Walker-rolling   Walk 50 feet activity   Assist Walk 50 feet with 2 turns activity did not occur: Safety/medical concerns  Assist level: Minimal Assistance - Patient > 75% Assistive device: Walker-rolling    Walk 150 feet activity   Assist Walk 150 feet activity did not occur: Safety/medical concerns  Assist level: Minimal Assistance - Patient > 75% Assistive device: Walker-rolling    Walk 10 feet on uneven surface  activity   Assist Walk 10 feet on uneven surfaces activity did not occur: Safety/medical concerns         Wheelchair     Assist Will patient use wheelchair at discharge?: Yes Type of Wheelchair: Manual    Wheelchair assist level: Supervision/Verbal cueing Max wheelchair distance: 150    Wheelchair 50 feet with 2 turns activity    Assist        Assist Level: Supervision/Verbal cueing   Wheelchair 150 feet activity     Assist     Assist Level: Supervision/Verbal cueing      Medical Problem List and Plan: 1.  Decreased endurance, dizziness with activity, problems with sequencing and poor safety awareness secondary to right hip fracture s/p right hip hemiarthroplasty.  Continue CIR  Repeat x-ray personally reviewed, and reviewed with PA as well as Ortho.  Discussed periprosthetic fracture and treatment options with Ortho.  Ultimately, after discussion with family, plan to proceed with revision next week.  Preop UA ordered for the weekend  Plan to make n.p.o. after midnight on 5/16 2.  Antithrombotics: -DVT/anticoagulation:  Pharmaceutical: Hold Coumadin, goal 2-3, for procedure.  Heparin GGT initiated  INR therapeutic on 5/12             -antiplatelet therapy: N/A 3. Pain Management:   Tramadol prn  Tylenol prn.  Added ice prn for local measures.    Oxycodone DC'd due to cognitive side effects.  K pad added. Can use ice too  Robaxin scheduled on 5/11             Monitor with increased exertion 4. Mood: LCSW to follow for evaluation and support.              -antipsychotic agents: N/A 5. Neuropsych: This patient is ?fully capable of making decisions on her own behalf.  TSH WNL  Home Fluoxetine started on 5/6  Patient will require consistent team support, discussed anxiety and relaxation with patient 6. Skin/Wound Care: Monitor wound daily for healing.   Plan for preop labs on Sunday 7. Fluids/Electrolytes/Nutrition: Monitor I/O.   8. Hyponatremia: Acute on chronic --baseline 130. Will monitor with strict I/Os   Sodium 131 on 5/10, plan to order labs for the end of the week  Follow up again later in week 9. CAF: Monitor HR tid-on Cardizem, Zebata and coumadin.   Cardizem increased to 180 on 5/7  Rate controlled on 5/13  Monitor with increased activity. 10. Chronic diastolic CHF: I/Os.  Continue irbesartan, Zebeta and pravachol.   Dose adjustments per cards Filed Weights   11/10/19 0455 11/11/19 0500 11/12/19 0500  Weight: 80.8 kg 81 kg 81.5 kg   Stable on 5/13 11. Bronchiectasis/COPD: On Dulera bid             Weaned supplemental daytime oxygen, continue supplemental oxygen nightly  DuoNebs scheduled with improvement  Bronchoscopy as outpatient per patient  Preop chest x-ray ordered for Sunday 13. Reactive leucocytosis: Resolved 14. Pre-renal azotemia: Resolved 15. GERD/cricopharyngeal achalasia: Resumed PPI. Needs to be upright for meals.  16. Drug induced constipation: Added senna to colace as had not had BM since admission.              Increase bowel meds  Improving 17.  Essential hypertension  Controlled on 5/11  Orthostatics negative on 5/12  See #9 18.  Moderate hypoalbuminemia  Supplement initiated on 5/1 19.  Transaminitis: Resolved  LFTs elevated within normal limits on 5/7  Continue to  monitor 20.  Acute blood loss anemia  Hemoglobin up to 11.9 on 5/12  Plan for preop labs on Sunday 21.  Vasovagal episode  See #10  No further work-up per pulmonology after review of chest x-ray -follow-up as outpatient.  Resolved 22.  Hypoalbuminemia  Supplement initiated on 5/7  23. Leukocytosis  WBCs 10.6 on 5/12  Afebrile  LOS: 13 days A FACE TO FACE EVALUATION WAS PERFORMED  Lonnie Reth [name] 11/12/2019, 10:14 AM

## 2019-11-12 NOTE — Progress Notes (Signed)
Surgery Monday at 3:15 PM for right hip hemiarthroplasty revision with cemented stem and cables.

## 2019-11-12 NOTE — Progress Notes (Signed)
Occupational Therapy Session Note  Patient Details  Name: Stephanie Cortez MRN: [id number] Date of Birth: 02-05-1934  Today's Date: 11/12/2019 OT Individual Time: 1500-1600 OT Individual Time Calculation (min): 60 min    Short Term Goals: Week 1:  OT Short Term Goal 1 (Week 1): Pt will thread BLE into pants wiht AE PRN and MIN VC OT Short Term Goal 1 - Progress (Week 1): Met OT Short Term Goal 2 (Week 1): Pt will don socks with AE PRN and MIN VC OT Short Term Goal 2 - Progress (Week 1): Progressing toward goal OT Short Term Goal 3 (Week 1): Pt will complete CM for toileting with MIN A for standing balnace OT Short Term Goal 3 - Progress (Week 1): Met OT Short Term Goal 4 (Week 1): Pt will complete toilet transfer wiht MIN A OT Short Term Goal 4 - Progress (Week 1): Met  Skilled Therapeutic Interventions/Progress Updates:    1:1. Pt received in bed agreeable to OT. Pt reporting adamantly not willing to get up d/t hairline fracture. Prolonged discussion, education and therapeutic use of self to discuss new TDWB precautions and risks of lying in bed all 4 days till surgery (weakness, poor circulation, orthostasis, etc). Pt eventually agreeable to sitting EOB for UB therex. Pt completes 2x10 dowel rod (1#) shoulder flex/ext, chext press, shoulder press, circles in B directions, elbow flex/ext with demo/VC for slow controlled movement. Exited session with pt seated in bed, exit alarm on and call light I nreach  Therapy Documentation Precautions:  Precautions Precautions: Posterior Hip Precaution Booklet Issued: Yes (comment) Precaution Comments: Unable to recall.  All THP reviewed Restrictions Weight Bearing Restrictions: Yes RLE Weight Bearing: (bed rest) General:   Vital Signs: Therapy Vitals Temp: 97.9 F (36.6 C) Pulse Rate: 68 Resp: 16 BP: (!) 117/54 Patient Position (if appropriate): Lying Oxygen Therapy SpO2: 96 % O2 Device: Room Air Pain:   ADL:   Vision    Perception    Praxis   Exercises:   Other Treatments:     Therapy/Group: Individual Therapy  Tonny Branch 11/12/2019, 4:11 PM

## 2019-11-12 NOTE — Progress Notes (Signed)
86 year old female underwent press-fit bipolar hemiarthroplasty 10/28/2019 for a displaced right femoral neck fracture and was walking out the door short distance in the hall and was transferred to rehab due to her multiple other medical problems. Since that time she is had increased pain problems not walking as well does not remember a specific time with sharp increased pain but x-rays were obtained due to her poor progress which showed periprosthetic fracture with the lesser troches broken off as a separate fragment. 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 X-ray from 10/28/2019 shows moderately displaced lesser trochanter medially with subsidence of the prosthesis 25 mm.  Patient not able lift her leg she has problems with rotation problems getting out of the bed without his help and has had increased problems with increased pain over the last at least 4days. Patient's daughter and son-in-law are at bedside and we went over x-rays with the patient and family and discussed options. Her right hip has remained reduced however with subsidence of the prosthesis hip short and she is having considerable pain. I discussed I recommend revision with cemented stem and cables to the lesser trochanter. We reviewed initial postoperative x-rays that showed good position of the prosthesis. Summer with therapy she may have had a crack occur and then migration of the fragment with subsidence. Patient is on Coumadin and got a dose last night INR is 2.7 and needs to have her Coumadin stopped preoperative labs obtained including c-Met, UA, CBC. She will need a new chest x-ray since last the chest x-ray shows some areas of consolidation of concern the left upper lobe. We discussed options for spinal versus general anesthesia which would be up to the anesthesiologist. I will posted for surgery Monday afternoon. My cell phone [phone].

## 2019-11-12 NOTE — Progress Notes (Signed)
Physical Therapy Weekly Progress Note  Patient Details  Name: Stephanie Cortez MRN: [id number] Date of Birth: Jan 20, 1934  Beginning of progress report period: Oct 31, 2019 End of progress report period: Nov 12, 2019  Today's Date: 11/12/2019 PT Individual Time: 1030-1127 PT Individual Time Calculation (min): 57 min   Patient has met 3 of 11 long term goals. Pt's discharge date recently extended until 5/19 due to pt's decline in functional mobility and increased anxiety. As of 5/7 pt was a min A/CGA level for functional mobility/transfers, ambulating 164f, and able to navigate 4 stairs with 1 rail. Currently, pt requires min/mod A for bed mobility, mod A for stand<>pivot transfers, is ambulating 159fprior to getting fatigued, and was not able to problem solve how to navigate stairs yesterday. Pt with new hairline fracture of R hip and is now TDWB on R LE with plans for surgery on Monday 5/17. Pt is extremely fearful of movement and anxious about "doing anything that could mess up her hip for surgery". Pt continues to be limited by pain and increased anxiety about surgery next week.   Patient continues to demonstrate the following deficits muscle weakness and decreased standing balance, decreased postural control, decreased balance strategies and difficulty maintaining precautions and therefore will continue to benefit from skilled PT intervention to increase functional independence with mobility.  Patient progressing toward long term goals..  Continue plan of care.  PT Short Term Goals Week 2:  PT Short Term Goal 1 (Week 2): STG=LTG due to LOS Week 3:  PT Short Term Goal 1 (Week 3): STG=LTG due to LOS  Skilled Therapeutic Interventions/Progress Updates:  Ambulation/gait training;Discharge planning;DME/adaptive equipment instruction;Functional mobility training;Pain management;Psychosocial support;Splinting/orthotics;Therapeutic Activities;UE/LE Strength taining/ROM;Wheelchair  propulsion/positioning;UE/LE Coordination activities;Therapeutic Exercise;Stair training;Skin care/wound management;Patient/family education;Disease management/prevention;Neuromuscular re-education;Community reintegration;Balance/vestibular training   Today's Interventions: Received pt supine in bed, pt agreeable to therapy, and reported 7/10 pain in R hip along incision (premedicated). Repositioning, rest breaks, and distraction done to reduce pain. Pt with sign in room stating "strict bedrest". Consulted with PA, Pam, and she reported that pt is able to participate in therapy but is under TDWB restrictions on R LE due to new hairline fx in R hip. Therapist attempted to explain this to pt upon entry, however pt with increased anxiety and fear stating "they told me to tell you no to therapy when you come in". Therapist explained PA's report that pt just is not able to put full weight on R LE but can still participate in therapy; however pt continued to disagree with therapist. PA notified and present at bedside to explain restrictions to pt; pt verbalized feeling better after having PA present and agreeable to bed level exercises only due to fear of sitting EOB despite being cleared to do so. Session focused heavily on education, restrictions and precautions, LE strength, and improved activity tolerance. Pt with questions regarding why her R LE was resting in ER. Therapist educated pt on anatomy of hip joint, effects of the fx, muscle compensation patterns due to pain, and introduced exercise for hip IR to assist in maintaining R LE in neutral position (pt performed x5 reps of R LE IR without increased pain). Pt performed the following exercises supine in bed with verbal cues for technique: -heel slides 2x15 on L LE -ankle circles x20 bilaterally clockwise/counterclockwise -SLR x 15 on L LE -Hip abduction 2x15 on L LE -Marching 2x15 on L LE -incentive spirometer x 10 reps Concluded session with pt supine  in bed, needs  within reach, and bed alarm on.   Therapy Documentation Precautions:  Precautions Precautions: Posterior Hip Precaution Booklet Issued: Yes (comment) Precaution Comments: Unable to recall.  All THP reviewed Restrictions Weight Bearing Restrictions: Yes RLE Weight Bearing: Weight bearing as tolerated(Bedrest at this time)  Therapy/Group: Individual Therapy  Alfonse Alpers PT, DPT  11/12/2019, 7:31 AM

## 2019-11-13 ENCOUNTER — Inpatient Hospital Stay (HOSPITAL_COMMUNITY): Payer: Medicare Other

## 2019-11-13 ENCOUNTER — Inpatient Hospital Stay (HOSPITAL_COMMUNITY): Payer: Medicare Other | Admitting: Occupational Therapy

## 2019-11-13 LAB — PROTIME-INR
INR: 2.6 — ABNORMAL HIGH (ref 0.8–1.2)
Prothrombin Time: 27.2 seconds — ABNORMAL HIGH (ref 11.4–15.2)

## 2019-11-13 LAB — MRSA PCR SCREENING: MRSA by PCR: NEGATIVE

## 2019-11-13 MED ORDER — VANCOMYCIN HCL IN DEXTROSE 1-5 GM/200ML-% IV SOLN: 1000.0000 mg | INTRAVENOUS | Status: AC

## 2019-11-13 MED ORDER — PHYTONADIONE 5 MG PO TABS
5.0000 mg | ORAL_TABLET | Freq: Once | ORAL | Status: AC
  Administered 2019-11-13: 5 mg via ORAL
  Filled 2019-11-13: qty 1

## 2019-11-13 NOTE — Progress Notes (Addendum)
Physical Therapy Session Note  Patient Details  Name: Stephanie Cortez MRN: [id number] Date of Birth: 09/22/1933  Today's Date: 11/13/2019 PT Individual Time: 1430-1540 PT Individual Time Calculation (min): 70 min   Short Term Goals: Week 1:  PT Short Term Goal 1 (Week 1): Pt will transfer bed<>chair w/ min assist PT Short Term Goal 1 - Progress (Week 1): Met PT Short Term Goal 2 (Week 1): Pt will ambulate 25' w/ min assist w/ LRAD PT Short Term Goal 2 - Progress (Week 1): Met PT Short Term Goal 3 (Week 1): Pt will initiate stair training PT Short Term Goal 3 - Progress (Week 1): Met Week 2:  PT Short Term Goal 1 (Week 2): STG=LTG due to LOS Week 3:  PT Short Term Goal 1 (Week 3): STG=LTG due to LOS  Skilled Therapeutic Interventions/Progress Updates:    PAIN 5/10 R hip  Pt initially OOB in wc and agreeable to pt session w/emphasis on cardiovascular conditioning and LE AROM exercises. Pt transported to gym for session PT instructed w/SBT for ease of transfer and NWB to RLE. Pt performed SBT to/from wc to /from mat w/set up and min assist   Therex: Seated LAQ x 20 L, 10R  Sit to supine w/min assist for LEs Supine: TKEs x 25 Heel slides AAROM x 20 Hip abd/add w/sliding board and pillowcase to decrease resistance x 20 Clamshells x 25 Hip IR/ER "windshield wipers" x 25 Glut sets w/3 sec count x 20  Supine to sit w/mod assist. SBT to wc w/set up and min assist, cues  wc propulsion x 60ft w/bilat UEs for endurance challenge. UBE x 5    Min for cardiovascular conditioning, RPE 5/10.  Pt transported back to room.  Pt requesting to use BSC due to need for BM.  BSC obtained and set up for SBT by therapist.  wc to bsc w/set up and min assist, cues for sequencing.  Pt requested to be given time on commode for BM. Pt positioned safely on BSC w/all needs in reach and nursing notified.    Therapy Documentation Precautions:  Precautions Precautions: Posterior Hip Precaution  Booklet Issued: Yes (comment) Precaution Comments: Unable to recall.  All THP reviewed Restrictions Weight Bearing Restrictions: Yes RLE Weight Bearing: Weight bearing as tolerated    Therapy/Group: Individual Therapy  [place] , PT   [place] M  11/13/2019, 3:25 PM  

## 2019-11-13 NOTE — Progress Notes (Signed)
Discussed patient's INR with pharmacy and Dr. Lorin Mercy concern about normalizing INR prior to surgery Monday. [facility] recommended Vitamin K 5 mg as this should normalize INR by 48 hours as surgery not till Monday. Patient can always get IV Vitamin K on Sunday if INR is still high.

## 2019-11-13 NOTE — Progress Notes (Addendum)
   Subjective:   Procedure(s) (LRB): revision right hip bipolar to cemented bipolar (Right) Patient reports pain as moderate.  Pain with hip motion and WB.   Objective: Vital signs in last 24 hours: Temp:  [97.9 F (36.6 C)-98.2 F (36.8 C)] 98.2 F (36.8 C) (05/14 0452) Pulse Rate:  [57-81] 81 (05/14 0639) Resp:  [16-18] 18 (05/14 0452) BP: (112-134)/(51-57) 134/57 (05/14 0639) SpO2:  [96 %-99 %] 98 % (05/14 0738) Weight:  [84 kg] 84 kg (05/14 0537)  Intake/Output from previous day: 05/13 0701 - 05/14 0700 In: 666 [P.O.:666] Out: 300 [Urine:300] Intake/Output this shift: No intake/output data recorded.  Recent Labs    11/11/19 1149  HGB 11.9*   Recent Labs    11/11/19 1149  WBC 10.6*  RBC 3.88  HCT 37.2  PLT 405*   No results for input(s): NA, K, CL, CO2, BUN, CREATININE, GLUCOSE, CALCIUM in the last 72 hours. Recent Labs    11/11/19 0537 11/13/19 0615  INR 2.7* 2.6*    right leg short . sensation intact No results found.  Assessment/Plan:   Procedure(s) (LRB): revision right hip bipolar to cemented bipolar (Right) Plan:   Revision surgery on Monday. INR has not moved and likely will need some vit K. Needs to be below 1.6 by Monday AM.  Preferably closer to 1.0 since she will have blood loss with revision surgery.  Thank you Pharmacy for help with reversal and later Heparin pre-op.    She can TDWB to transfer to recliner to have some mobility . OK to logroll.   Marybelle Killings 11/13/2019, 9:35 AM

## 2019-11-13 NOTE — Progress Notes (Signed)
Patient noted in bed during shift. Bed rest order is still in place, At the beginning of the shift, she did not c/o any pain but did ask for pain medication at approximately midnight with a pain scale of 9/10. The incision to the right posterior hip was covered by a foam dressing, has no drainage & the staples are still intact. Redness & edema to the site is minimal. She was able to tell a little about what has occurred with her in the last few days. She knew that she was having surgery again. She stated that the procedure was not explained to her, so consent was not signed & will be passed on to the on coming nurse. No acute distress noted. Oxygen by nasal canula is infusing at 2lpm. MRSA screen for procedure will be performed as part of her preprocedure tasks. Kpad was ordered & put on this morning to help with pain relief. Will continue to monitor

## 2019-11-13 NOTE — Progress Notes (Signed)
Physical Therapy Session Note  Patient Details  Name: Stephanie Cortez MRN: [id number] Date of Birth: [birth year]  Today's Date: 11/13/2019 PT Individual Time: 0900-1011 PT Individual Time Calculation (min): 71 min   Short Term Goals: Week 2:  PT Short Term Goal 1 (Week 2): STG=LTG due to LOS Week 3:  PT Short Term Goal 1 (Week 3): STG=LTG due to LOS  Skilled Therapeutic Interventions/Progress Updates:   Received pt supine in bed, pt agreeable to therapy, and reported pain 5/10 in R hip (premedicated). Repositioning, rest breaks, and distraction done to reduce pain. Pt stated "I am very concerned about this next surgery and doing something to make it worse". Therapist educated pt on weight bearing restrictions and what she is/is not allowed to do but encouraged mobility. Therapist encouraged pt to transfer to [facility] to get OOB for pressure relief, digestion, respiration, and improved orthostatics; however pt refused stating "there's no way to do that without putting weight on my R leg". Therapist demonstrated lateral scoot transfer to pt, however pt continued to refuse OOB mobility. Pt agreeable to therapist creating and going through HEP program with her. Session focused on education, LE strength, dynamic sitting balance, HEP, functional mobility/transfers, and improved activity tolerance. Pt performed the following exercises with verbal cues for technique: -supine ankle circles x 20 clockwise/counterclockwise bilaterally  -supine heel slides x15 on L LE -supine hip abduction x15 on L LE -supine marching x15 on L LE -supine quad sets 2x20 bilaterally (pt with increased difficulty isolating quadriceps muscle) -supine SAQ x20 bilaterally  Orthopedic surgeon present to discuss surgery on Monday. Per surgeon, pt is TDWB on R LE to transfer to [facility] and recliner. Therapist explained to surgeon that pt is hesitant to get OOB. Surgeon proceeded to re-educate pt on importance of OOB mobility for pressure relief,  skin integrity, respiration, and digestion. Pt finally agreed to transfer into chair. Donned pants in supine with mod A and pt transferred supine<>sitting EOB from flat bed with mod A and use of bedrails. Pt with increased anxiety initially when sitting EOB but reported feeling more calm after increased time and pursed lip breathing. Pt transferred bed<>WC via lateral scoot mod A with verbal cues for hand placement, head/hips relationship when scooting, and to maintain TDWB status on R LE. Pt remained seated in WC while therapist provided pt with HEP consisting of the following exercises and educated pt on frequency/technique: -Supine Ankle Circles - 1 x daily - 7 x weekly - 10 reps - 3 sets -Supine Heel Slide - 1 x daily - 7 x weekly - 10 reps - 3 sets -Supine March - 1 x daily - 7 x weekly - 10 reps - 3 sets -Supine Short Arc Quad - 1 x daily - 7 x weekly - 10 reps - 3 sets -Seated Long Arc Quad - 1 x daily - 7 x weekly - 10 reps - 3 sets -Supine Quad Set - 1 x daily - 7 x weekly - 10 reps - 3 sets -Seated March - 1 x daily - 7 x weekly - 10 reps - 3 sets Pt requested to return to bed prior to next therapy session and transferred WC<>bed via lateral scoot min A and sit<>supine with min A. Concluded session with pt supine in bed, needs within reach, and bed alarm on. Therapist provided pt with fresh ice water.   Therapy Documentation Precautions:  Precautions Precautions: Posterior Hip Precaution Booklet Issued: Yes (comment) Precaution Comments: Unable to recall.  All  THP reviewed Restrictions Weight Bearing Restrictions: Yes RLE Weight Bearing: (bed rest)  Therapy/Group: Individual Therapy Alfonse Alpers PT, DPT   11/13/2019, 7:29 AM

## 2019-11-13 NOTE — Progress Notes (Addendum)
ANTICOAGULATION CONSULT NOTE - Follow Up Consult  Pharmacy Consult for warfarin >> heparin Indication: atrial fibrillation, s/p R THA   Patient Measurements: Height: 5\' 6"  (167.6 cm) Weight: 84 kg (185 lb 3 oz) IBW/kg (Calculated) : 59.3  Vital Signs: Temp: 98.2 F (36.8 C) (05/14 0452) Temp Source: Oral (05/14 0452) BP: 134/57 (05/14 0639) Pulse Rate: 81 (05/14 0639)  Labs: Recent Labs    11/11/19 0537 11/11/19 1149 11/13/19 0615  HGB  --  11.9*  --   HCT  --  37.2  --   PLT  --  405*  --   LABPROT 27.6*  --  27.2*  INR 2.7*  --  2.6*    Estimated Creatinine Clearance: 57.2 mL/min (by C-G formula based on SCr of 0.7 mg/dL).  Assessment: 86 year old W on warfarin PTA for hx of atrial fibrillation (CHADS2VASc = 4). Pt admitted for fall d/t hip fracture, underwent right hip arthroplasty on 4/28. Pharmacy consulted 10/30/19 to resume warfarin. Patient planned for orthopedic revision surgery on 5/17 so warfarin was held starting 5/13 and pharmacy consulted to start heparin.   INR continues to be therapeutic. Will not give reversal agents given OR is set for 5/17, may consider PO 2.5-5mg  on 5/15 if not trending down. Will monitor INR daily and start heparin when INR is less than 2.   PTA warfarin dose = 1.25 mg on MWF, 2.5 mg all other days    Goal of Therapy:  Heparin Level 0.3 -0.7 units/mL Antiplatelet monitoring per protocol   Plan:  Start heparin 1200 units/hr when INR is less than 2  Warfarin held since 5/13 Monitor daily INR, QMonday CBC/plt Monitor for signs/symptoms of bleeding   Addendum:  Discussed vitamin K dosing with Rehab PA and Ortho MD to ensure INR is down by Monday. Clarified that ideally INR will be at 1 for surgery. Suggested giving vitamin K 5mg  PO today instead of 10mg  as that would keep the INR down for longer than necessary and we have 3 full days before surgery to allow INR to down trend. Emphasized that the full effect of PO vitamin K will not  be seen until 48 hours from dose. If INR is not at goal by Sunday, can give additional 1mg  IV vitamin K dose then.   Benetta Spar, PharmD, BCPS, BCCP Clinical Pharmacist  Please check AMION for all [place] phone numbers After 10:00 PM, call [place] [phone]

## 2019-11-13 NOTE — Progress Notes (Signed)
Stephanie Cortez PHYSICAL MEDICINE & REHABILITATION PROGRESS NOTE  Subjective/Complaints: Patient seen laying in bed this morning.  She states she slept well overnight.  She has questions regarding timing of recurrence of fracture.  Discussed with patient continuation of therapy, encouraged maximal participation.  Discussed with Ortho plans for surgery.  Ortho notes reviewed-plan for surgery Monday 3:15 PM.  Discussed with pharmacy INR.  ROS: Denies CP, SOB, N/V/D  Objective: Vital Signs: Blood pressure (!) 134/57, pulse 81, temperature 98.2 F (36.8 C), temperature source Oral, resp. rate 18, height 5\' 6"  (1.676 m), weight 84 kg, SpO2 98 %. DG HIP UNILAT WITH PELVIS 2-3 VIEWS RIGHT  Result Date: 11/11/2019 CLINICAL DATA:  Right hip pain after fall 3 days ago. EXAM: DG HIP (WITH OR WITHOUT PELVIS) 2-3V RIGHT COMPARISON:  October 28, 2019. FINDINGS: Status post right total hip arthroplasty. Moderately displaced fracture is seen involving a portion of the proximal right femur including the lesser trochanter medially. This is new since prior exam. IMPRESSION: Moderately displaced proximal right femoral fracture is noted including lesser trochanter medially. Status post right total hip arthroplasty. Electronically Signed   By: Marijo Conception M.D.   On: 11/11/2019 14:52   Recent Labs    11/11/19 1149  WBC 10.6*  HGB 11.9*  HCT 37.2  PLT 405*   No results for input(s): NA, K, CL, CO2, GLUCOSE, BUN, CREATININE, CALCIUM in the last 72 hours.  Physical Exam: BP (!) 134/57 (BP Location: Right Arm)   Pulse 81   Temp 98.2 F (36.8 C) (Oral)   Resp 18   Ht 5\' 6"  (1.676 m)   Wt 84 kg   LMP  (LMP Unknown)   SpO2 98%   BMI 29.89 kg/m  Constitutional: No distress . Vital signs reviewed. Constitutional: No distress . Vital signs reviewed. HENT: Normocephalic.  Atraumatic. Eyes: EOMI. No discharge. Cardiovascular: No JVD. Respiratory: Normal effort.  No stridor. GI: Right hip with dressing  C/D/I Skin: Warm and dry.  Intact. Psych: Normal mood.  Normal behavior. Musc: Right hip with edema and tenderness Neurological: Alert Motor: Motor: Left lower extremity: 4+/5 hip flexion, knee extension, 4+/5 ankle dorsiflexion, stable Right lower extremity: Hip flexion 2/5 limited by pain, knee extension 3 +/5, ankle dorsiflexion 4/5, stable  Assessment/Plan: 1. Functional deficits secondary to right hip fracture status post right hip hemiarthroplasty which require 3+ hours per day of interdisciplinary therapy in a comprehensive inpatient rehab setting.  Physiatrist is providing close team supervision and 24 hour management of active medical problems listed below.  Physiatrist and rehab team continue to assess barriers to discharge/monitor patient progress toward functional and medical goals  Care Tool:  Bathing    Body parts bathed by patient: Right arm, Left arm, Chest, Abdomen, Front perineal area, Right upper leg, Left upper leg, Face   Body parts bathed by helper: Buttocks, Right lower leg, Left lower leg     Bathing assist Assist Level: Minimal Assistance - Patient > 75%     Upper Body Dressing/Undressing Upper body dressing   What is the patient wearing?: Pull over shirt    Upper body assist Assist Level: Set up assist    Lower Body Dressing/Undressing Lower body dressing      What is the patient wearing?: Incontinence brief, Pants     Lower body assist Assist for lower body dressing: Moderate Assistance - Patient 50 - 74%     Toileting Toileting    Toileting assist Assist for toileting: Minimal Assistance -  Patient > 75%     Transfers Chair/bed transfer  Transfers assist  Chair/bed transfer activity did not occur: Safety/medical concerns  Chair/bed transfer assist level: Moderate Assistance - Patient 50 - 74%     Locomotion Ambulation   Ambulation assist   Ambulation activity did not occur: Safety/medical concerns  Assist level: Moderate  Assistance - Patient 50 - 74% Assistive device: Walker-rolling Max distance: 72ft   Walk 10 feet activity   Assist  Walk 10 feet activity did not occur: Safety/medical concerns  Assist level: Moderate Assistance - Patient - 50 - 74% Assistive device: Walker-rolling   Walk 50 feet activity   Assist Walk 50 feet with 2 turns activity did not occur: Safety/medical concerns  Assist level: Minimal Assistance - Patient > 75% Assistive device: Walker-rolling    Walk 150 feet activity   Assist Walk 150 feet activity did not occur: Safety/medical concerns  Assist level: Minimal Assistance - Patient > 75% Assistive device: Walker-rolling    Walk 10 feet on uneven surface  activity   Assist Walk 10 feet on uneven surfaces activity did not occur: Safety/medical concerns         Wheelchair     Assist Will patient use wheelchair at discharge?: Yes Type of Wheelchair: Manual    Wheelchair assist level: Supervision/Verbal cueing Max wheelchair distance: 150    Wheelchair 50 feet with 2 turns activity    Assist        Assist Level: Supervision/Verbal cueing   Wheelchair 150 feet activity     Assist     Assist Level: Supervision/Verbal cueing      Medical Problem List and Plan: 1.  Decreased endurance, dizziness with activity, problems with sequencing and poor safety awareness secondary to right hip fracture s/p right hip hemiarthroplasty.  Continue CIR  Repeat x-ray personally reviewed, and reviewed with PA as well as Ortho.  Discussed periprosthetic fracture and treatment options with Ortho.  Ultimately, after discussion with family, plan to proceed with revision on Monday at 3:15 PM.  Preop UA ordered for the weekend  Plan to make n.p.o. after midnight on 5/16 2.  Antithrombotics: -DVT/anticoagulation:  Pharmaceutical: Hold Coumadin, goal 2-3, for procedure.    INR 2.6 on 5/14, vitamin K ordered  Heparin GGT             -antiplatelet therapy:  N/A 3. Pain Management:   Tramadol prn  Tylenol prn.  Added ice prn for local measures.   Oxycodone DC'd due to cognitive side effects.  K pad added. Can use ice too  Robaxin scheduled on 5/11  Controlled with meds on 5/14             Monitor with increased exertion 4. Mood: LCSW to follow for evaluation and support.              -antipsychotic agents: N/A 5. Neuropsych: This patient is ?fully capable of making decisions on her own behalf.  TSH WNL  Home Fluoxetine started on 5/6  Patient will require consistent team support, discussed anxiety and relaxation with patient 6. Skin/Wound Care: Monitor wound daily for healing.   Plan for preop labs ordered for Monday 7. Fluids/Electrolytes/Nutrition: Monitor I/O.   8. Hyponatremia: Acute on chronic --baseline 130. Will monitor with strict I/Os   Sodium 131 on 5/10, preop labs ordered for Monday  Follow up again later in week 9. CAF: Monitor HR tid-on Cardizem, Zebata   Cardizem increased to 180 on 5/7  Rate controlled on 5/14  See #2             Monitor with increased activity. 10. Chronic diastolic CHF: I/Os.  Continue irbesartan, Zebeta and pravachol.   Dose adjustments per cards [facility] Weights   11/11/19 0500 11/12/19 0500 11/13/19 0537  Weight: 81 kg 81.5 kg 84 kg   ?  Reliability on 5/14 11. Bronchiectasis/COPD: On Dulera bid             Weaned supplemental daytime oxygen, continue supplemental oxygen nightly  DuoNebs scheduled with improvement  Bronchoscopy as outpatient per patient  Preop chest x-ray ordered for Sunday  Improving 13. Reactive leucocytosis: Resolved 14. Pre-renal azotemia: Resolved 15. GERD/cricopharyngeal achalasia: Resumed PPI. Needs to be upright for meals.  16. Drug induced constipation: Added senna to colace as had not had BM since admission.              Increase bowel meds  Improving 17.  Essential hypertension  Controlled on 5/14  Orthostatics negative on 5/12  See #9 18.  Moderate  hypoalbuminemia  Supplement initiated on 5/1 19.  Transaminitis: Resolved  LFTs elevated within normal limits on 5/7  Continue to monitor 20.  Acute blood loss anemia  Hemoglobin up to 11.9 on 5/12  Preop labs ordered for Monday 21.  Vasovagal episode  See #10  No further work-up per pulmonology after review of chest x-ray -follow-up as outpatient.  Resolved 22.  Hypoalbuminemia  Supplement initiated on 5/7  23. Leukocytosis  WBCs 10.6 on 5/12, labs ordered for Monday  Afebrile  >35 minutes spent in total in counseling coordination of care regarding periprosthetic fracture, plan for surgery, preop preparations for surgery, discussion with patient, pharmacy, Ortho.  LOS: 14 days A FACE TO FACE EVALUATION WAS PERFORMED  Shazia Mitchener [name] 11/13/2019, 10:15 AM

## 2019-11-13 NOTE — Progress Notes (Signed)
Occupational Therapy Weekly Progress Note  Patient Details  Name: Stephanie Cortez MRN: [id number] Date of Birth: [birth year]  Beginning of progress report period: Nov 06, 2019 End of progress report period: Nov 13, 2019  Today's Date: 11/13/2019 OT Individual Time: [id number] OT Individual Time Calculation (min): 64 min     Short term goals not set due to estimated length of stay.  Pt is making slow progress towards goals.  Monday 5/10 pt with increased pain in Rt hip and requiring increased physical assistance for sit <> stand and functional transfers.  Pt reporting increased radiating pain Tues 5/11, Weds x-ray of hip shows hairline fracture in Rt hip.  Pt currently requires min assist sit > stand and stand pivot transfers with RW, with mod cues for TDWB during mobility.  Pt initially quite fearful of mobility post x-ray results however with encouragement and direction from surgeon pt with increased tolerance to stand pivot transfers.  Pt currently requires min-mod assist for LB dressing, continue to benefit from education on use of AE for LB dressing.  Plan for surgery 5/17 to address hairline fracture.  Patient continues to demonstrate the following deficits: muscle weakness,decreased cardiorespiratoy endurance,decreased coordination and decreased motor planning,decreased safety awareness and decreased memoryand decreased sitting balance, decreased standing balance, decreased postural control, decreased balance strategies and difficulty maintaining precautions and therefore will continue to benefit from skilled OT intervention to enhance overall performance with BADL and Reduce care partner burden.  See Patient's Care Plan for progression toward long term goals.  Patient progressing toward long term goals..  Continue plan of care.  Skilled Therapeutic Interventions/Progress Updates:    Treatment session with focus on functional transfers and increased activity tolerance.  Pt received supine  in bed reporting update regarding hairline fracture and surgery planned for Monday.  Pt fearful with mobility, but reports understanding of recommendations to increase activity even to standing and stand pivot transfers while adhering to TDWB precautions.  Pt agreeable to engaging in bathing/dressing seated EOB.  Mod assist bed mobility with cues for log rolling and positioning to decrease pain.  Setup assist for UB bathing and dressing.  Pt reports need to toilet.  Completed sit > stand with RW from elevated EOB with mod fading to min assist.  Completed stand pivot transfer with RW with mod cues for TDWB and cues to put weight through BUE to allow for maintaining WB precautions.  Pt required mod assist for clothing management due to fearfulness with dynamic standing and increasing weight shifting through RLE.  Blocked practice stand pivot transfers with decreased fear, pt continuing to require min assist and cues for WB precautions.  Pt remained upright in w/c with seat belt alarm on and all needs in reach.  Therapy Documentation Precautions:  Precautions Precautions: Posterior Hip Precaution Booklet Issued: Yes (comment) Precaution Comments: Unable to recall.  All THP reviewed Restrictions Weight Bearing Restrictions: Yes RLE Weight Bearing: Weight bearing as tolerated General:   Vital Signs: Therapy Vitals Temp: 98.5 F (36.9 C) Pulse Rate: 76 Resp: 16 BP: (!) 126/56 Patient Position (if appropriate): Sitting Oxygen Therapy SpO2: 96 % O2 Device: Room Air Pain:  Pt with c/o pain in Rt hip with mobility.  Premedicated and repositioned.   Therapy/Group: Individual Therapy  Simonne Come 11/13/2019, 3:42 PM

## 2019-11-14 ENCOUNTER — Inpatient Hospital Stay (HOSPITAL_COMMUNITY): Payer: Medicare Other | Admitting: Physical Therapy

## 2019-11-14 LAB — URINALYSIS, COMPLETE (UACMP) WITH MICROSCOPIC
Bacteria, UA: NONE SEEN
Bilirubin Urine: NEGATIVE
Glucose, UA: NEGATIVE mg/dL
Ketones, ur: NEGATIVE mg/dL
Leukocytes,Ua: NEGATIVE
Nitrite: NEGATIVE
Protein, ur: NEGATIVE mg/dL
Specific Gravity, Urine: 1.006 (ref 1.005–1.030)
pH: 7 (ref 5.0–8.0)

## 2019-11-14 LAB — PROTIME-INR
INR: 1.7 — ABNORMAL HIGH (ref 0.8–1.2)
Prothrombin Time: 19.6 seconds — ABNORMAL HIGH (ref 11.4–15.2)

## 2019-11-14 LAB — HEPARIN LEVEL (UNFRACTIONATED): Heparin Unfractionated: 0.41 IU/mL (ref 0.30–0.70)

## 2019-11-14 MED ORDER — HEPARIN (PORCINE) 25000 UT/250ML-% IV SOLN
1050.0000 [IU]/h | INTRAVENOUS | Status: DC
  Administered 2019-11-14 – 2019-11-15 (×2): 1200 [IU]/h via INTRAVENOUS
  Administered 2019-11-16: 1050 [IU]/h via INTRAVENOUS
  Filled 2019-11-14 (×3): qty 250

## 2019-11-14 MED ORDER — VANCOMYCIN HCL IN DEXTROSE 1-5 GM/200ML-% IV SOLN: 1000.0000 mg | INTRAVENOUS | Status: DC

## 2019-11-14 NOTE — Progress Notes (Signed)
ANTICOAGULATION CONSULT NOTE - Follow Up Consult  Pharmacy Consult for warfarin >> heparin Indication: atrial fibrillation, s/p R THA  Patient Measurements: Height: 5\' 6"  (167.6 cm) Weight: 84.1 kg (185 lb 6.5 oz) IBW/kg (Calculated) : 59.3  Vital Signs: Temp: 98.2 F (36.8 C) (05/15 1342) Temp Source: Oral (05/15 1342) BP: 117/55 (05/15 1342) Pulse Rate: 62 (05/15 1342)  Labs: Recent Labs    11/13/19 0615 11/14/19 0700 11/14/19 1634  LABPROT 27.2* 19.6*  --   INR 2.6* 1.7*  --   HEPARINUNFRC  --   --  0.41    Estimated Creatinine Clearance: 57.2 mL/min (by C-G formula based on SCr of 0.7 mg/dL).  Assessment: 86 year old W on warfarin PTA for hx of atrial fibrillation (CHADS2VASc = 4). Pt admitted for fall d/t hip fracture, underwent right hip arthroplasty on 4/28. Pharmacy consulted 10/30/19 to resume warfarin. Patient planned for orthopedic revision surgery on 5/17 so warfarin was held starting 5/13 and pharmacy consulted to start heparin.   PTA warfarin dose = 1.25 mg on MWF, 2.5 mg all other days    Today INR has decreased from 2.6 to 1.7 s/p vit K 5 mg PO on 5/14. INR is below 2, will start heparin. No bleeding per RN.   Heparin drip 1200 uts/hr HL 0.41 at goal.   Goal of Therapy:  Heparin Level 0.3 -0.7 units/mL Antiplatelet monitoring per protocol   Plan:  Continue  heparin 1200 units/hr  Continue holding warfarin  Monitor daily HL, INR, CBC Monitor for signs/symptoms of bleeding   Bonnita Nasuti Pharm.D. CPP, BCPS Clinical Pharmacist [phone] 11/14/2019 7:22 PM    Please check AMION for all [place] phone numbers After 10:00 PM, call [place] [phone]

## 2019-11-14 NOTE — Progress Notes (Signed)
ANTICOAGULATION CONSULT NOTE - Follow Up Consult  Pharmacy Consult for warfarin >> heparin Indication: atrial fibrillation, s/p R THA  Patient Measurements: Height: 5\' 6"  (167.6 cm) Weight: 84.1 kg (185 lb 6.5 oz) IBW/kg (Calculated) : 59.3  Vital Signs: Temp: 97.7 F (36.5 C) (05/15 0533) Temp Source: Oral (05/15 0533) BP: 141/62 (05/15 0624) Pulse Rate: 62 (05/15 0624)  Labs: Recent Labs    11/11/19 1149 11/13/19 0615 11/14/19 0700  HGB 11.9*  --   --   HCT 37.2  --   --   PLT 405*  --   --   LABPROT  --  27.2* 19.6*  INR  --  2.6* 1.7*    Estimated Creatinine Clearance: 57.2 mL/min (by C-G formula based on SCr of 0.7 mg/dL).  Assessment: 86 year old W on warfarin PTA for hx of atrial fibrillation (CHADS2VASc = 4). Pt admitted for fall d/t hip fracture, underwent right hip arthroplasty on 4/28. Pharmacy consulted 10/30/19 to resume warfarin. Patient planned for orthopedic revision surgery on 5/17 so warfarin was held starting 5/13 and pharmacy consulted to start heparin.   PTA warfarin dose = 1.25 mg on MWF, 2.5 mg all other days    Today INR has decreased from 2.6 to 1.7 s/p vit K 5 mg PO on 5/14. INR is below 2, will start heparin when INR is less than 2. No bleeding per RN.   Goal of Therapy:  Heparin Level 0.3 -0.7 units/mL Antiplatelet monitoring per protocol   Plan:  Start heparin 1200 units/hr  Continue holding warfarin  Check 8hr HL @ 1730 Monitor daily HL, INR, CBC Monitor for signs/symptoms of bleeding   Lorel Monaco, PharmD PGY1 Ambulatory Care Resident [place] # [phone]  Please check AMION for all [place] phone numbers After 10:00 PM, call [place] [phone]

## 2019-11-14 NOTE — Progress Notes (Addendum)
ANTICOAGULATION CONSULT NOTE - Follow Up Consult  Pharmacy Consult for warfarin >> heparin Indication: atrial fibrillation, s/p R THA  Patient Measurements: Height: 5\' 6"  (167.6 cm) Weight: 84.1 kg (185 lb 6.5 oz) IBW/kg (Calculated) : 59.3  Vital Signs: Temp: 97.7 F (36.5 C) (05/15 0533) Temp Source: Oral (05/15 0533) BP: 141/62 (05/15 0624) Pulse Rate: 62 (05/15 0624)  Labs: Recent Labs    11/11/19 1149 11/13/19 0615 11/14/19 0700  HGB 11.9*  --   --   HCT 37.2  --   --   PLT 405*  --   --   LABPROT  --  27.2* 19.6*  INR  --  2.6* 1.7*    Estimated Creatinine Clearance: 57.2 mL/min (by C-G formula based on SCr of 0.7 mg/dL).  Assessment: 86 year old W on warfarin PTA for hx of atrial fibrillation (CHADS2VASc = 4). Pt admitted for fall d/t hip fracture, underwent right hip arthroplasty on 4/28. Pharmacy consulted 10/30/19 to resume warfarin. Patient planned for orthopedic revision surgery on 5/17 so warfarin was held starting 5/13 and pharmacy consulted to start heparin.   PTA warfarin dose = 1.25 mg on MWF, 2.5 mg all other days    Today INR has decreased from 2.6 to 1.7 s/p vit K 5 mg PO on 5/14. INR is below 2, will start heparin. No bleeding per RN.   Goal of Therapy:  Heparin Level 0.3 -0.7 units/mL Antiplatelet monitoring per protocol   Plan:  No bolus Start heparin 1200 units/hr  Continue holding warfarin  Check 8hr HL @ 1730 Monitor daily HL, INR, CBC Monitor for signs/symptoms of bleeding   Lorel Monaco, PharmD PGY1 Ambulatory Care Resident [place] # [phone]  Please check AMION for all [place] phone numbers After 10:00 PM, call [place] [phone]

## 2019-11-14 NOTE — Progress Notes (Signed)
[place] PHYSICAL MEDICINE & REHABILITATION PROGRESS NOTE  Subjective/Complaints: Plan for surgery Monday 3:15pm. Ortho note reviewed.  Daughter and patient asking about discharge date.   ROS: Denies CP, SOB, N/V/D  Objective: Vital Signs: Blood pressure (!) 117/55, pulse 62, temperature 98.2 F (36.8 C), temperature source Oral, resp. rate 17, height 5\' 6"  (1.676 m), weight 84.1 kg, SpO2 96 %. No results found. No results for input(s): WBC, HGB, HCT, PLT in the last 72 hours. No results for input(s): NA, K, CL, CO2, GLUCOSE, BUN, CREATININE, CALCIUM in the last 72 hours.  Physical Exam: BP (!) 117/55 (BP Location: Right Arm)   Pulse 62   Temp 98.2 F (36.8 C) (Oral)   Resp 17   Ht 5\' 6"  (1.676 m)   Wt 84.1 kg   LMP  (LMP Unknown)   SpO2 96%   BMI 29.93 kg/m  Constitutional: No distress . Vital signs reviewed. Lying in bed comfortably HENT: Normocephalic.  Atraumatic. Eyes: EOMI. No discharge. Cardiovascular: No JVD. Respiratory: Normal effort.  No stridor. GI: Right hip with dressing C/D/I Skin: Warm and dry.  Intact. Psych: Normal mood.  Normal behavior. Musc: Right hip with edema and tenderness Neurological: Alert Motor: Motor: Left lower extremity: 4+/5 hip flexion, knee extension, 4+/5 ankle dorsiflexion, stable Right lower extremity: Hip flexion 2/5 limited by pain, knee extension 3 +/5, ankle dorsiflexion 4/5, stable  Assessment/Plan: 1. Functional deficits secondary to right hip fracture status post right hip hemiarthroplasty which require 3+ hours per day of interdisciplinary therapy in a comprehensive inpatient rehab setting.  Physiatrist is providing close team supervision and 24 hour management of active medical problems listed below.  Physiatrist and rehab team continue to assess barriers to discharge/monitor patient progress toward functional and medical goals  Care Tool:  Bathing    Body parts bathed by patient: Right arm, Left arm, Chest,  Abdomen, Front perineal area, Right upper leg, Left upper leg, Face   Body parts bathed by helper: Buttocks, Right lower leg, Left lower leg     Bathing assist Assist Level: Minimal Assistance - Patient > 75%     Upper Body Dressing/Undressing Upper body dressing   What is the patient wearing?: Pull over shirt    Upper body assist Assist Level: Set up assist    Lower Body Dressing/Undressing Lower body dressing      What is the patient wearing?: Pants     Lower body assist Assist for lower body dressing: Moderate Assistance - Patient 50 - 74%     Toileting Toileting    Toileting assist Assist for toileting: Minimal Assistance - Patient > 75%     Transfers Chair/bed transfer  Transfers assist  Chair/bed transfer activity did not occur: Safety/medical concerns  Chair/bed transfer assist level: Minimal Assistance - Patient > 75%(sliding board transfer)     Locomotion Ambulation   Ambulation assist   Ambulation activity did not occur: Safety/medical concerns  Assist level: Moderate Assistance - Patient 50 - 74% Assistive device: Walker-rolling Max distance: 79ft   Walk 10 feet activity   Assist  Walk 10 feet activity did not occur: Safety/medical concerns  Assist level: Moderate Assistance - Patient - 50 - 74% Assistive device: Walker-rolling   Walk 50 feet activity   Assist Walk 50 feet with 2 turns activity did not occur: Safety/medical concerns  Assist level: Minimal Assistance - Patient > 75% Assistive device: Walker-rolling    Walk 150 feet activity   Assist Walk 150 feet activity did not  occur: Safety/medical concerns  Assist level: Minimal Assistance - Patient > 75% Assistive device: Walker-rolling    Walk 10 feet on uneven surface  activity   Assist Walk 10 feet on uneven surfaces activity did not occur: Safety/medical concerns         Wheelchair     Assist Will patient use wheelchair at discharge?: Yes Type of  Wheelchair: Manual    Wheelchair assist level: Supervision/Verbal cueing Max wheelchair distance: 60    Wheelchair 50 feet with 2 turns activity    Assist        Assist Level: Supervision/Verbal cueing   Wheelchair 150 feet activity     Assist     Assist Level: Supervision/Verbal cueing      Medical Problem List and Plan: 1.  Decreased endurance, dizziness with activity, problems with sequencing and poor safety awareness secondary to right hip fracture s/p right hip hemiarthroplasty.  Continue CIR  Repeat x-ray personally reviewed, and reviewed with PA as well as Ortho.  Discussed periprosthetic fracture and treatment options with Ortho.  Ultimately, after discussion with family, plan to proceed with revision on Monday at 3:15 PM.  Preop UA ordered for the weekend  Plan to make n.p.o. after midnight on 5/16 2.  Antithrombotics: -DVT/anticoagulation:  Pharmaceutical: Hold Coumadin, goal 2-3, for procedure.    INR 2.6 on 5/14, vitamin K ordered  INR 1.7 on 5/15, closer to goal.   Heparin GGT             -antiplatelet therapy: N/A 3. Pain Management:   Tramadol prn  Tylenol prn.  Added ice prn for local measures.   Oxycodone DC'd due to cognitive side effects.  K pad added. Can use ice too  Robaxin scheduled on 5/11  Controlled with meds on 5/15             Monitor with increased exertion 4. Mood: LCSW to follow for evaluation and support.              -antipsychotic agents: N/A 5. Neuropsych: This patient is ?fully capable of making decisions on her own behalf.  TSH WNL  Home Fluoxetine started on 5/6  Patient will require consistent team support, discussed anxiety and relaxation with patient 6. Skin/Wound Care: Monitor wound daily for healing.   Plan for preop labs ordered for Monday 7. Fluids/Electrolytes/Nutrition: Monitor I/O.   8. Hyponatremia: Acute on chronic --baseline 130. Will monitor with strict I/Os   Sodium 131 on 5/10, preop labs ordered for  Monday  Follow up again later in week 9. CAF: Monitor HR tid-on Cardizem, Zebata   Cardizem increased to 180 on 5/7  Rate controlled on 5/14  See #2             Monitor with increased activity. 10. Chronic diastolic CHF: I/Os.  Continue irbesartan, Zebeta and pravachol.   Dose adjustments per cards [facility] Weights   11/12/19 0500 11/13/19 0537 11/14/19 0624  Weight: 81.5 kg 84 kg 84.1 kg   Weights stable 5/15. 11. Bronchiectasis/COPD: On Dulera bid             Weaned supplemental daytime oxygen, continue supplemental oxygen nightly  DuoNebs scheduled with improvement  Bronchoscopy as outpatient per patient  Preop chest x-ray ordered for Sunday  Improving 13. Reactive leucocytosis: Resolved 14. Pre-renal azotemia: Resolved 15. GERD/cricopharyngeal achalasia: Resumed PPI. Needs to be upright for meals.  16. Drug induced constipation: Added senna to colace as had not had BM since admission.  Increase bowel meds  Improving 17.  Essential hypertension  Controlled on 5/14  Orthostatics negative on 5/12  See #9 18.  Moderate hypoalbuminemia  Supplement initiated on 5/1 19.  Transaminitis: Resolved  LFTs elevated within normal limits on 5/7  Continue to monitor 20.  Acute blood loss anemia  Hemoglobin up to 11.9 on 5/12  Preop labs ordered for Monday 21.  Vasovagal episode  See #10  No further work-up per pulmonology after review of chest x-ray -follow-up as outpatient.  Resolved 22.  Hypoalbuminemia  Supplement initiated on 5/7  23. Leukocytosis  WBCs 10.6 on 5/12, labs ordered for Monday  Afebrile  >35 minutes spent in total in counseling coordination of care regarding periprosthetic fracture, plan for surgery, preop preparations for surgery, discussion with patient, pharmacy, Ortho.  LOS: 15 days A FACE TO FACE EVALUATION WAS PERFORMED  Clide Deutscher Korin Setzler 11/14/2019, 4:04 PM

## 2019-11-14 NOTE — Progress Notes (Signed)
Physical Therapy Session Note  Patient Details  Name: Stephanie Cortez MRN: [id number] Date of Birth: [birth year]  Today's Date: 11/14/2019 PT Individual Time: 4196-2229 PT Individual Time Calculation (min): 45 min   Short Term Goals: Week 3:  PT Short Term Goal 1 (Week 3): STG=LTG due to LOS  Skilled Therapeutic Interventions/Progress Updates:   Pt received supine in bed and agreeable to PT. Pt has been cleared to transfer to recliner with TDWB, but pt declines transfer due to fear of causing more injury to surgical site. PT instructed pt in bed level therex: SAQ x 10 BLE, ankle PF with level 1 tband, LLE clam shell with manual resistance. Hip/knee flexion extension x 12 LLE. Hip abduction x 10 LLE, RLE abduction with AAROM in pain free range ~10 deg then return to neutral. BUE therex woth 3# bar weight, chest press x 10, bicpe curl x 10, lateral trunk rotation x 10 bil. Pt left supine in bed with call bell in reach and all needs met.       Therapy Documentation Precautions:  Precautions Precautions: Posterior Hip Precaution Booklet Issued: Yes (comment) Precaution Comments: Unable to recall.  All THP reviewed Restrictions Weight Bearing Restrictions: Yes RLE Weight Bearing: Touchdown weight bearing General: PT Amount of Missed Time (min): 15 Minutes PT Missed Treatment Reason: Patient fatigue Pain: Denies at rest.  Therapy/Group: Individual Therapy  [name] 11/14/2019, 5:52 PM

## 2019-11-14 NOTE — Progress Notes (Signed)
Pain managed to right thigh & Hip with scheduled robaxin and tylenol and PRN ultram. PRN ultram 50mg 's given at 2003 & 0220. Edema noted to RLE. Foam dressing in place to right hip incision, no drainage. Using bedpan or female urinal. Scheduled miralax held because of loose stools. Stephanie Cortez A

## 2019-11-15 ENCOUNTER — Inpatient Hospital Stay (HOSPITAL_COMMUNITY): Payer: Medicare Other | Admitting: Physical Therapy

## 2019-11-15 ENCOUNTER — Inpatient Hospital Stay (HOSPITAL_COMMUNITY): Payer: Medicare Other

## 2019-11-15 LAB — HEPARIN LEVEL (UNFRACTIONATED)
Heparin Unfractionated: 0.87 IU/mL — ABNORMAL HIGH (ref 0.30–0.70)
Heparin Unfractionated: 0.66 IU/mL (ref 0.30–0.70)

## 2019-11-15 LAB — CBC
HCT: 36.4 % (ref 36.0–46.0)
Hemoglobin: 11.5 g/dL — ABNORMAL LOW (ref 12.0–15.0)
MCH: 30.7 pg (ref 26.0–34.0)
MCHC: 31.6 g/dL (ref 30.0–36.0)
MCV: 97.1 fL (ref 80.0–100.0)
Platelets: 411 10*3/uL — ABNORMAL HIGH (ref 150–400)
RBC: 3.75 MIL/uL — ABNORMAL LOW (ref 3.87–5.11)
RDW: 13.5 % (ref 11.5–15.5)
WBC: 7.4 10*3/uL (ref 4.0–10.5)
nRBC: 0 % (ref 0.0–0.2)

## 2019-11-15 LAB — PROTIME-INR
INR: 1.5 — ABNORMAL HIGH (ref 0.8–1.2)
Prothrombin Time: 17.9 seconds — ABNORMAL HIGH (ref 11.4–15.2)

## 2019-11-15 LAB — VITAMIN D 25 HYDROXY (VIT D DEFICIENCY, FRACTURES): Vit D, 25-Hydroxy: 34.6 ng/mL (ref 30–100)

## 2019-11-15 NOTE — Progress Notes (Addendum)
ANTICOAGULATION CONSULT NOTE - Follow Up Consult  Pharmacy Consult for warfarin >> heparin Indication: atrial fibrillation, s/p R THA  Patient Measurements: Height: 5\' 6"  (167.6 cm) Weight: 84.2 kg (185 lb 10 oz) IBW/kg (Calculated) : 59.3  Vital Signs: Temp: 98.7 F (37.1 C) (05/16 0529) Temp Source: Oral (05/16 0529) BP: 120/53 (05/16 0529) Pulse Rate: 63 (05/16 0529)  Labs: Recent Labs    11/13/19 0615 11/14/19 0700 11/14/19 1634 11/15/19 0718  HGB  --   --   --  11.5*  HCT  --   --   --  36.4  PLT  --   --   --  411*  LABPROT 27.2* 19.6*  --  17.9*  INR 2.6* 1.7*  --  1.5*  HEPARINUNFRC  --   --  0.41 0.87*    Estimated Creatinine Clearance: 57.3 mL/min (by C-G formula based on SCr of 0.7 mg/dL).  Assessment: 86 year old W on warfarin PTA for hx of atrial fibrillation (CHADS2VASc = 4). Pt admitted for fall d/t hip fracture, underwent right hip arthroplasty on 4/28. Pharmacy consulted 10/30/19 to resume warfarin. Patient planned for orthopedic revision surgery on 5/17 so warfarin was held starting 5/13 and pharmacy consulted to start heparin.   PTA warfarin dose = 1.25 mg on MWF, 2.5 mg all other days    Heparin level this AM above goal at 0.87 on 1200 units/hr, however initial HL level was therapeutic on same rate. Nurse reports AM heparin level today was drawn peripherally from the opposite arm of the heparin gtt. CBC stable. No bleeding or infusion issues per RN.   Addendum: F/u heparin level therapeutic at 0.66 on 1050 units/hr.  Goal of Therapy:  Heparin Level 0.3 -0.7 units/mL Antiplatelet monitoring per protocol   Plan:  Continue heparin 1050 units/hr  Continue holding warfarin Daily HL and CBC Monitor for signs/symptoms of bleeding   Lorel Monaco, PharmD PGY1 Ambulatory Care Resident [employer] # [phone]    Please check AMION for all [place] phone numbers After 10:00 PM, call [place] [phone]

## 2019-11-15 NOTE — Progress Notes (Signed)
Physical Therapy Session Note  Patient Details  Name: Stephanie Cortez MRN: [id number] Date of Birth: 05-14-1934  Today's Date: 11/15/2019 PT Individual Time: 1425-1538 PT Individual Time Calculation (min): 73 min   Short Term Goals: Week 3:  PT Short Term Goal 1 (Week 3): STG=LTG due to LOS  Skilled Therapeutic Interventions/Progress Updates:   Pt received supine in bed with her son-in-law present and pt agreeable to therapy session reporting it would feel good to get out of her room. Dietary services in/out for dinner order for tonight - therapist made them aware of NPO at midnight for procedure tomorrow per MD note. Supine>sitting L EOB with min assist for trunk upright, HOB partially elevated and using bedrails. Sit>stand elevated EOB>RW with min assist for lifting into standing. R stand pivot to w/c with min assist for balance and pt shuffling L foot on ground being able to maintain R LE TDWB. Pt noted to have increased work of breathing SpO2 assessed at 91% increasing to 93% in 15seconds.  Transported to/from gym in w/c for time management and energy conservation. Performed B UE arm bike targeting cardiovascular endurance training against level 1 resistance for 5 minutes totaling .37 distance. SpO2 93% after on RA. Sit<>stand w/c<>RW with min assist for lifting/lowering and balance once in standing while relocating R UE from armrest to RW - tolerates standing ~30seconds but requests to sit due to lightheadedness feeling. Provided seated rest break for symptom dissipation. Transported back to room as pt requesting to rest prior to tomorrow. L stand pivot to EOB using RW with min assist for lifting/lowering and balance -  pt shuffling L foot on ground to maintain R LE TDWB. Sit>supine with min assist for B LE management into the bed - max cuing for sequencing to maintain hip precautions with pt demonstrating understanding. Pt agreeable to perform the following LE exercises: - supine L LE heel slides  x15 reps - R LE ankle DF/PF x20 reps  - R LE hip internal rotation to neutral as pt demonstrates excessive resting in external rotation x20reps - pt denies pain with this exercise - R LE hip abduction/adduction (not past midline) x20 reps active assisted (pt denies pain with this exercise) - R LE short arc quads x15 reps  Noticed redness along pt's R lower leg near ankle and foot - pt reports hx of cellulitis - Kayla RN notified. Pt left supine in bed with needs in reach, lines intact, and bed alarm on.  Therapy Documentation Precautions:  Precautions Precautions: Posterior Hip Precaution Booklet Issued: Yes (comment) Precaution Comments: Unable to recall.  All THP reviewed Restrictions Weight Bearing Restrictions: Yes RLE Weight Bearing: Touchdown weight bearing  Pain:   Reports "just a little tiredness type pain in R hip" - this does not limit participation in therapy.   Therapy/Group: Individual Therapy  Tawana Scale, PT, DPT 11/15/2019, 2:52 PM

## 2019-11-15 NOTE — Progress Notes (Signed)
Occupational Therapy Session Note  Patient Details  Name: Stephanie Cortez MRN: [id number] Date of Birth: Jan 20, 1934  Today's Date: 11/15/2019 OT Individual Time: 1132-1202 OT Individual Time Calculation (min): 30 min    Short Term Goals: STG = LTGs due to remaining ELOS  Skilled Therapeutic Interventions/Progress Updates:    Treatment session with focus on self-care retraining and activity tolerance.  Pt received semi-reclined in bed agreeable to completing self-care tasks at EOB.  Pt declined any OOB activity this session due to fearfulness of further injuring her hip.  Pt's daughter present throughout session, therefore engaging in education on hip precautions and techniques to increase success while minimizing pain with mobility.  Pt's daughter asking questions about what comes next, educated on surgery and probable short Acute stay post surgery then maybe reconsult to CIR if appropriate.  Pt engaged in UB bathing and dressing seated EOB with CGA due to posterior lean in unsupported sitting.  Pt continues to report fearfulness of further injuring her hip and therefore requested to return to supine at end of session and not get OOB.  Therapist assisted with positioning and rolling to decrease pain in Rt hip with bed mobility.  Pt remained semi-reclined with all needs in reach.  Therapy Documentation Precautions:  Precautions Precautions: Posterior Hip Precaution Booklet Issued: Yes (comment) Precaution Comments: Unable to recall.  All THP reviewed Restrictions Weight Bearing Restrictions: Yes RLE Weight Bearing: Touchdown weight bearing General:   Vital Signs: Therapy Vitals Temp: 98.1 F (36.7 C) Temp Source: Oral Pulse Rate: 63 Resp: 17 BP: (!) 124/58 Patient Position (if appropriate): Lying Oxygen Therapy SpO2: 94 % O2 Device: Room Air Pain:  Pt with c/o pain in Rt hip, not rated.  Repositioned.   Therapy/Group: Individual Therapy  Simonne Come 11/15/2019, 3:28 PM

## 2019-11-15 NOTE — Progress Notes (Signed)
Slept good. PRN ultram given at [place], for C/O right hip and right thigh pain. RLE with edema and tender to touch. Foam dressing in place to right hip incision.  Declined need for ice pack. Continues to request bedpan. Reluctant to bear weight on RLE. Heparin infusing at 4ml/hr. Wears O2 at HS. Expiratory and inspiratory wheeze to LUL and LLL. Using I.S. Stephanie Cortez A

## 2019-11-15 NOTE — Progress Notes (Addendum)
[facility] PHYSICAL MEDICINE & REHABILITATION PROGRESS NOTE  Subjective/Complaints: NPO at midnight for procedure tomorrow.   Vitals stable. Has no complaints. Denies pain.   ROS: Denies CP, SOB, N/V/D  Objective: Vital Signs: Blood pressure (!) 120/53, pulse 63, temperature 98.7 F (37.1 C), temperature source Oral, resp. rate 19, height 5\' 6"  (1.676 m), weight 84.2 kg, SpO2 99 %. DG Chest 2 View  Result Date: 11/15/2019 CLINICAL DATA:  Bronchiectasis EXAM: CHEST - 2 VIEW COMPARISON:  Chest x-ray dated 11/02/2019 chest CT dated 07/17/2019 FINDINGS: Stable patchy airspace opacities within the upper lungs bilaterally, LEFT slightly greater than RIGHT. No new lung findings. Stable cardiomegaly. Chronic deformity of the LEFT humeral head/neck. No acute appearing osseous abnormality. IMPRESSION: 1. Stable patchy airspace opacities within the upper lungs bilaterally, LEFT slightly greater than RIGHT, corresponding to areas of chronic interstitial lung disease (chronic hypersensitivity pneumonitis) described on earlier chest CT report of 07/17/2019. 2. No acute findings. 3. Stable cardiomegaly. Electronically Signed   By: Franki Cabot M.D.   On: 11/15/2019 09:57   Recent Labs    11/15/19 0718  WBC 7.4  HGB 11.5*  HCT 36.4  PLT 411*   No results for input(s): NA, K, CL, CO2, GLUCOSE, BUN, CREATININE, CALCIUM in the last 72 hours.  Physical Exam: BP (!) 120/53 (BP Location: Right Arm)   Pulse 63   Temp 98.7 F (37.1 C) (Oral)   Resp 19   Ht 5\' 6"  (1.676 m)   Wt 84.2 kg   LMP  (LMP Unknown)   SpO2 99%   BMI 29.96 kg/m  Constitutional: No distress . Vital signs reviewed. Lying in bed comfortably HENT: Normocephalic.  Atraumatic. Eyes: EOMI. No discharge. Cardiovascular: No JVD. Respiratory: Normal effort.  No stridor. GI: NT, ND Skin: Right hip incision C/D/I with staples in place Psych: Normal mood.  Normal behavior. Musc: Right hip with edema and tenderness Neurological:  Alert Motor: Motor: Left lower extremity: 4+/5 hip flexion, knee extension, 4+/5 ankle dorsiflexion, stable Right lower extremity: Hip flexion 2/5 limited by pain, knee extension 3 +/5, ankle dorsiflexion 4/5, stable  Assessment/Plan: 1. Functional deficits secondary to right hip fracture status post right hip hemiarthroplasty which require 3+ hours per day of interdisciplinary therapy in a comprehensive inpatient rehab setting.  Physiatrist is providing close team supervision and 24 hour management of active medical problems listed below.  Physiatrist and rehab team continue to assess barriers to discharge/monitor patient progress toward functional and medical goals  Care Tool:  Bathing    Body parts bathed by patient: Right arm, Left arm, Chest, Abdomen, Front perineal area, Right upper leg, Left upper leg, Face   Body parts bathed by helper: Buttocks, Right lower leg, Left lower leg     Bathing assist Assist Level: Minimal Assistance - Patient > 75%     Upper Body Dressing/Undressing Upper body dressing   What is the patient wearing?: Pull over shirt    Upper body assist Assist Level: Set up assist    Lower Body Dressing/Undressing Lower body dressing      What is the patient wearing?: Pants     Lower body assist Assist for lower body dressing: Moderate Assistance - Patient 50 - 74%     Toileting Toileting    Toileting assist Assist for toileting: Minimal Assistance - Patient > 75%     Transfers Chair/bed transfer  Transfers assist  Chair/bed transfer activity did not occur: Safety/medical concerns  Chair/bed transfer assist level: Minimal Assistance -  Patient > 75%(sliding board transfer)     Locomotion Ambulation   Ambulation assist   Ambulation activity did not occur: Safety/medical concerns  Assist level: Moderate Assistance - Patient 50 - 74% Assistive device: Walker-rolling Max distance: 65ft   Walk 10 feet activity   Assist  Walk 10  feet activity did not occur: Safety/medical concerns  Assist level: Moderate Assistance - Patient - 50 - 74% Assistive device: Walker-rolling   Walk 50 feet activity   Assist Walk 50 feet with 2 turns activity did not occur: Safety/medical concerns  Assist level: Minimal Assistance - Patient > 75% Assistive device: Walker-rolling    Walk 150 feet activity   Assist Walk 150 feet activity did not occur: Safety/medical concerns  Assist level: Minimal Assistance - Patient > 75% Assistive device: Walker-rolling    Walk 10 feet on uneven surface  activity   Assist Walk 10 feet on uneven surfaces activity did not occur: Safety/medical concerns         Wheelchair     Assist Will patient use wheelchair at discharge?: Yes Type of Wheelchair: Manual    Wheelchair assist level: Supervision/Verbal cueing Max wheelchair distance: 60    Wheelchair 50 feet with 2 turns activity    Assist        Assist Level: Supervision/Verbal cueing   Wheelchair 150 feet activity     Assist     Assist Level: Supervision/Verbal cueing      Medical Problem List and Plan: 1.  Decreased endurance, dizziness with activity, problems with sequencing and poor safety awareness secondary to right hip fracture s/p right hip hemiarthroplasty.  Continue CIR  Repeat x-ray personally reviewed, and reviewed with PA as well as Ortho.  Discussed periprosthetic fracture and treatment options with Ortho.  Ultimately, after discussion with family, plan to proceed with revision on Monday at 3:15 PM.  Preop UA ordered for the weekend- reviewed and negative.  2.  Antithrombotics: -DVT/anticoagulation:  Pharmaceutical: Hold Coumadin, goal 2-3, for procedure.    INR 2.6 on 5/14, vitamin K ordered  INR 1.7 on 5/15, closer to goal.   Heparin GGT  INR 1.5 on 5/16- at goal             -antiplatelet therapy: N/A 3. Pain Management:   Tramadol prn  Tylenol prn.  Added ice prn for local measures.    Oxycodone DC'd due to cognitive side effects.  K pad added. Can use ice too  Robaxin scheduled on 5/11  controlled with meds on 5/16             Monitor with increased exertion 4. Mood: LCSW to follow for evaluation and support.              -antipsychotic agents: N/A 5. Neuropsych: This patient is ?fully capable of making decisions on her own behalf.  TSH WNL  Home Fluoxetine started on 5/6  Patient will require consistent team support, discussed anxiety and relaxation with patient 6. Skin/Wound Care: Monitor wound daily for healing.   Plan for preop labs ordered for Monday 7. Fluids/Electrolytes/Nutrition: Monitor I/O.  NPO at midnight for procedure on 5/16. Vitamin D level assessed and within normal limits 8. Hyponatremia: Acute on chronic --baseline 130. Will monitor with strict I/Os   Sodium 131 on 5/10, preop labs ordered for Monday  Follow up again later in week 9. CAF: Monitor HR tid-on Cardizem, Zebata   Cardizem increased to 180 on 5/7  Rate controlled on 5/14  See #2  Monitor with increased activity. 10. Chronic diastolic CHF: I/Os.  Continue irbesartan, Zebeta and pravachol.   Dose adjustments per cards Filed Weights   11/14/19 0624 11/15/19 0529 11/15/19 0800  Weight: 84.1 kg 84.2 kg 84.2 kg   Weights stable 5/16 at 84.2 11. Bronchiectasis/COPD: On Dulera bid             Weaned supplemental daytime oxygen, continue supplemental oxygen nightly  DuoNebs scheduled with improvement  Bronchoscopy as outpatient per patient  Preop chest x-ray ordered for Sunday  Improving 13. Reactive leucocytosis: Resolved 14. Pre-renal azotemia: Resolved 15. GERD/cricopharyngeal achalasia: Resumed PPI. Needs to be upright for meals.  16. Drug induced constipation: Added senna to colace as had not had BM since admission.              Increase bowel meds  Improving 17.  Essential hypertension  Controlled on 5/16   Orthostatics negative on 5/12  See #9 18.  Moderate  hypoalbuminemia  Supplement initiated on 5/1 19.  Transaminitis: Resolved  LFTs elevated within normal limits on 5/7  Continue to monitor 20.  Acute blood loss anemia  Hemoglobin up to 11.9 on 5/12  Preop labs ordered for Monday 21.  Vasovagal episode  See #10  No further work-up per pulmonology after review of chest x-ray -follow-up as outpatient.  Resolved 22.  Hypoalbuminemia  Supplement initiated on 5/7  23. Leukocytosis  WBCs 10.6 on 5/12, labs ordered for Monday  Afebrile    LOS: 16 days A FACE TO [place] 11/15/2019, 10:13 AM

## 2019-11-16 ENCOUNTER — Inpatient Hospital Stay (HOSPITAL_COMMUNITY): Payer: Medicare Other

## 2019-11-16 ENCOUNTER — Inpatient Hospital Stay (HOSPITAL_COMMUNITY): Payer: Medicare Other | Admitting: Anesthesiology

## 2019-11-16 ENCOUNTER — Inpatient Hospital Stay (HOSPITAL_COMMUNITY)
Admission: RE | Admit: 2019-11-16 | Discharge: 2019-11-20 | DRG: 481 | Disposition: A | Discharge status: Skilled Nursing Facility | Payer: Medicare Other | Source: Ambulatory Visit | Attending: Orthopaedic Surgery | Admitting: Orthopaedic Surgery

## 2019-11-16 ENCOUNTER — Encounter (HOSPITAL_COMMUNITY): Payer: Self-pay | Admitting: Orthopaedic Surgery

## 2019-11-16 ENCOUNTER — Encounter (HOSPITAL_COMMUNITY)
Admission: RE | Disposition: A | Discharge status: Skilled Nursing Facility | Payer: Self-pay | Source: Ambulatory Visit | Attending: Orthopaedic Surgery

## 2019-11-16 ENCOUNTER — Other Ambulatory Visit: Payer: Self-pay

## 2019-11-16 DIAGNOSIS — Z880 Allergy status to penicillin: Secondary | ICD-10-CM | POA: Diagnosis not present

## 2019-11-16 DIAGNOSIS — Z881 Allergy status to other antibiotic agents status: Secondary | ICD-10-CM | POA: Diagnosis not present

## 2019-11-16 DIAGNOSIS — Z800 Family history of malignant neoplasm of digestive organs: Secondary | ICD-10-CM

## 2019-11-16 DIAGNOSIS — Z20822 Contact with and (suspected) exposure to covid-19: Secondary | ICD-10-CM | POA: Diagnosis not present

## 2019-11-16 DIAGNOSIS — F419 Anxiety disorder, unspecified: Secondary | ICD-10-CM | POA: Diagnosis not present

## 2019-11-16 DIAGNOSIS — Z471 Aftercare following joint replacement surgery: Secondary | ICD-10-CM | POA: Diagnosis not present

## 2019-11-16 DIAGNOSIS — M9701XD Periprosthetic fracture around internal prosthetic right hip joint, subsequent encounter: Secondary | ICD-10-CM | POA: Diagnosis not present

## 2019-11-16 DIAGNOSIS — R42 Dizziness and giddiness: Secondary | ICD-10-CM | POA: Diagnosis present

## 2019-11-16 DIAGNOSIS — D638 Anemia in other chronic diseases classified elsewhere: Secondary | ICD-10-CM | POA: Diagnosis not present

## 2019-11-16 DIAGNOSIS — Z79890 Hormone replacement therapy (postmenopausal): Secondary | ICD-10-CM | POA: Diagnosis not present

## 2019-11-16 DIAGNOSIS — K589 Irritable bowel syndrome without diarrhea: Secondary | ICD-10-CM | POA: Diagnosis not present

## 2019-11-16 DIAGNOSIS — K5790 Diverticulosis of intestine, part unspecified, without perforation or abscess without bleeding: Secondary | ICD-10-CM | POA: Diagnosis not present

## 2019-11-16 DIAGNOSIS — Z09 Encounter for follow-up examination after completed treatment for conditions other than malignant neoplasm: Secondary | ICD-10-CM

## 2019-11-16 DIAGNOSIS — Z888 Allergy status to other drugs, medicaments and biological substances status: Secondary | ICD-10-CM | POA: Diagnosis not present

## 2019-11-16 DIAGNOSIS — Z8371 Family history of colonic polyps: Secondary | ICD-10-CM

## 2019-11-16 DIAGNOSIS — E785 Hyperlipidemia, unspecified: Secondary | ICD-10-CM | POA: Diagnosis not present

## 2019-11-16 DIAGNOSIS — J449 Chronic obstructive pulmonary disease, unspecified: Secondary | ICD-10-CM | POA: Diagnosis present

## 2019-11-16 DIAGNOSIS — Z841 Family history of disorders of kidney and ureter: Secondary | ICD-10-CM | POA: Diagnosis not present

## 2019-11-16 DIAGNOSIS — I482 Chronic atrial fibrillation, unspecified: Secondary | ICD-10-CM | POA: Diagnosis not present

## 2019-11-16 DIAGNOSIS — Z8249 Family history of ischemic heart disease and other diseases of the circulatory system: Secondary | ICD-10-CM | POA: Diagnosis not present

## 2019-11-16 DIAGNOSIS — M9701XA Periprosthetic fracture around internal prosthetic right hip joint, initial encounter: Principal | ICD-10-CM

## 2019-11-16 DIAGNOSIS — Z825 Family history of asthma and other chronic lower respiratory diseases: Secondary | ICD-10-CM | POA: Diagnosis not present

## 2019-11-16 DIAGNOSIS — I110 Hypertensive heart disease with heart failure: Secondary | ICD-10-CM | POA: Diagnosis present

## 2019-11-16 DIAGNOSIS — Z79899 Other long term (current) drug therapy: Secondary | ICD-10-CM | POA: Diagnosis not present

## 2019-11-16 DIAGNOSIS — I509 Heart failure, unspecified: Secondary | ICD-10-CM | POA: Diagnosis not present

## 2019-11-16 DIAGNOSIS — M978XXA Periprosthetic fracture around other internal prosthetic joint, initial encounter: Secondary | ICD-10-CM | POA: Diagnosis not present

## 2019-11-16 DIAGNOSIS — I4891 Unspecified atrial fibrillation: Secondary | ICD-10-CM | POA: Diagnosis not present

## 2019-11-16 DIAGNOSIS — Z96641 Presence of right artificial hip joint: Secondary | ICD-10-CM | POA: Diagnosis not present

## 2019-11-16 DIAGNOSIS — K220 Achalasia of cardia: Secondary | ICD-10-CM | POA: Diagnosis not present

## 2019-11-16 DIAGNOSIS — M6281 Muscle weakness (generalized): Secondary | ICD-10-CM | POA: Diagnosis not present

## 2019-11-16 DIAGNOSIS — R278 Other lack of coordination: Secondary | ICD-10-CM | POA: Diagnosis not present

## 2019-11-16 DIAGNOSIS — K219 Gastro-esophageal reflux disease without esophagitis: Secondary | ICD-10-CM | POA: Diagnosis not present

## 2019-11-16 DIAGNOSIS — J479 Bronchiectasis, uncomplicated: Secondary | ICD-10-CM | POA: Diagnosis not present

## 2019-11-16 DIAGNOSIS — R2681 Unsteadiness on feet: Secondary | ICD-10-CM | POA: Diagnosis not present

## 2019-11-16 DIAGNOSIS — Z9181 History of falling: Secondary | ICD-10-CM | POA: Diagnosis not present

## 2019-11-16 DIAGNOSIS — Z7901 Long term (current) use of anticoagulants: Secondary | ICD-10-CM

## 2019-11-16 DIAGNOSIS — K5792 Diverticulitis of intestine, part unspecified, without perforation or abscess without bleeding: Secondary | ICD-10-CM | POA: Diagnosis not present

## 2019-11-16 DIAGNOSIS — I5032 Chronic diastolic (congestive) heart failure: Secondary | ICD-10-CM | POA: Diagnosis present

## 2019-11-16 DIAGNOSIS — H903 Sensorineural hearing loss, bilateral: Secondary | ICD-10-CM | POA: Diagnosis not present

## 2019-11-16 DIAGNOSIS — Z96649 Presence of unspecified artificial hip joint: Secondary | ICD-10-CM

## 2019-11-16 DIAGNOSIS — M2550 Pain in unspecified joint: Secondary | ICD-10-CM | POA: Diagnosis not present

## 2019-11-16 DIAGNOSIS — M9702XD Periprosthetic fracture around internal prosthetic left hip joint, subsequent encounter: Secondary | ICD-10-CM | POA: Diagnosis not present

## 2019-11-16 DIAGNOSIS — F329 Major depressive disorder, single episode, unspecified: Secondary | ICD-10-CM | POA: Diagnosis present

## 2019-11-16 DIAGNOSIS — Z7401 Bed confinement status: Secondary | ICD-10-CM | POA: Diagnosis not present

## 2019-11-16 DIAGNOSIS — E039 Hypothyroidism, unspecified: Secondary | ICD-10-CM | POA: Diagnosis not present

## 2019-11-16 DIAGNOSIS — Z823 Family history of stroke: Secondary | ICD-10-CM | POA: Diagnosis not present

## 2019-11-16 DIAGNOSIS — I10 Essential (primary) hypertension: Secondary | ICD-10-CM | POA: Diagnosis not present

## 2019-11-16 DIAGNOSIS — I959 Hypotension, unspecified: Secondary | ICD-10-CM | POA: Diagnosis not present

## 2019-11-16 DIAGNOSIS — Z419 Encounter for procedure for purposes other than remedying health state, unspecified: Secondary | ICD-10-CM

## 2019-11-16 DIAGNOSIS — E46 Unspecified protein-calorie malnutrition: Secondary | ICD-10-CM | POA: Diagnosis not present

## 2019-11-16 DIAGNOSIS — Z4789 Encounter for other orthopedic aftercare: Secondary | ICD-10-CM | POA: Diagnosis not present

## 2019-11-16 DIAGNOSIS — D649 Anemia, unspecified: Secondary | ICD-10-CM | POA: Diagnosis present

## 2019-11-16 DIAGNOSIS — M25551 Pain in right hip: Secondary | ICD-10-CM | POA: Diagnosis not present

## 2019-11-16 HISTORY — PX: TOTAL HIP REVISION: SHX763

## 2019-11-16 LAB — COMPREHENSIVE METABOLIC PANEL
ALT: 18 U/L (ref 0–44)
AST: 24 U/L (ref 15–41)
Albumin: 2.4 g/dL — ABNORMAL LOW (ref 3.5–5.0)
Alkaline Phosphatase: 149 U/L — ABNORMAL HIGH (ref 38–126)
Anion gap: 7 (ref 5–15)
BUN: 13 mg/dL (ref 8–23)
CO2: 24 mmol/L (ref 22–32)
Calcium: 8.9 mg/dL (ref 8.9–10.3)
Chloride: 102 mmol/L (ref 98–111)
Creatinine, Ser: 0.65 mg/dL (ref 0.44–1.00)
GFR calc Af Amer: >60 mL/min (ref >60)
GFR calc non Af Amer: >60 mL/min (ref >60)
Glucose, Bld: 107 mg/dL — ABNORMAL HIGH (ref 70–99)
Potassium: 4.6 mmol/L (ref 3.5–5.1)
Sodium: 133 mmol/L — ABNORMAL LOW (ref 135–145)
Total Bilirubin: 0.4 mg/dL (ref 0.3–1.2)
Total Protein: 5.7 g/dL — ABNORMAL LOW (ref 6.5–8.1)

## 2019-11-16 LAB — CBC WITH DIFFERENTIAL/PLATELET
Abs Immature Granulocytes: 0.03 10*3/uL (ref 0.00–0.07)
Basophils Absolute: 0.1 10*3/uL (ref 0.0–0.1)
Basophils Relative: 1 %
Eosinophils Absolute: 0.7 10*3/uL — ABNORMAL HIGH (ref 0.0–0.5)
Eosinophils Relative: 10 %
HCT: 34.7 % — ABNORMAL LOW (ref 36.0–46.0)
Hemoglobin: 10.9 g/dL — ABNORMAL LOW (ref 12.0–15.0)
Immature Granulocytes: 0 %
Lymphocytes Relative: 17 %
Lymphs Abs: 1.3 10*3/uL (ref 0.7–4.0)
MCH: 30 pg (ref 26.0–34.0)
MCHC: 31.4 g/dL (ref 30.0–36.0)
MCV: 95.6 fL (ref 80.0–100.0)
Monocytes Absolute: 1 10*3/uL (ref 0.1–1.0)
Monocytes Relative: 14 %
Neutro Abs: 4.3 10*3/uL (ref 1.7–7.7)
Neutrophils Relative %: 58 %
Platelets: 382 10*3/uL (ref 150–400)
RBC: 3.63 MIL/uL — ABNORMAL LOW (ref 3.87–5.11)
RDW: 13.6 % (ref 11.5–15.5)
WBC: 7.4 10*3/uL (ref 4.0–10.5)
nRBC: 0 % (ref 0.0–0.2)

## 2019-11-16 LAB — PREPARE RBC (CROSSMATCH)

## 2019-11-16 LAB — HEPARIN LEVEL (UNFRACTIONATED): Heparin Unfractionated: 0.49 IU/mL (ref 0.30–0.70)

## 2019-11-16 LAB — PROTIME-INR
INR: 1.5 — ABNORMAL HIGH (ref 0.8–1.2)
Prothrombin Time: 17.1 seconds — ABNORMAL HIGH (ref 11.4–15.2)

## 2019-11-16 SURGERY — TOTAL HIP REVISION
Anesthesia: General | Site: Hip | Laterality: Right

## 2019-11-16 SURGERY — TOTAL HIP REVISION
Anesthesia: Choice | Site: Hip | Laterality: Right

## 2019-11-16 MED ORDER — IPRATROPIUM-ALBUTEROL 0.5-2.5 (3) MG/3ML IN SOLN: 3.0000 mL | Freq: Two times a day (BID) | RESPIRATORY_TRACT | Status: DC | PRN

## 2019-11-16 MED ORDER — PRAVASTATIN SODIUM 40 MG PO TABS
40.0000 mg | ORAL_TABLET | Freq: Every day | ORAL | Status: DC
  Administered 2019-11-17 – 2019-11-19 (×3): 40 mg via ORAL
  Filled 2019-11-16 (×3): qty 1

## 2019-11-16 MED ORDER — ONDANSETRON HCL 4 MG/2ML IJ SOLN
INTRAMUSCULAR | Status: AC
  Filled 2019-11-16: qty 10

## 2019-11-16 MED ORDER — METHOCARBAMOL 500 MG PO TABS
250.0000 mg | ORAL_TABLET | Freq: Four times a day (QID) | ORAL | Status: DC
  Administered 2019-11-17 – 2019-11-20 (×15): 250 mg via ORAL
  Filled 2019-11-16 (×15): qty 1

## 2019-11-16 MED ORDER — MENTHOL 3 MG MT LOZG: 1.0000 | LOZENGE | OROMUCOSAL | Status: DC | PRN

## 2019-11-16 MED ORDER — FLUTICASONE PROPIONATE 50 MCG/ACT NA SUSP
1.0000 | Freq: Two times a day (BID) | NASAL | Status: DC | PRN
  Filled 2019-11-16: qty 16

## 2019-11-16 MED ORDER — LIDOCAINE 2% (20 MG/ML) 5 ML SYRINGE
INTRAMUSCULAR | Status: DC | PRN
  Administered 2019-11-16: 40 mg via INTRAVENOUS

## 2019-11-16 MED ORDER — ACETAMINOPHEN 325 MG PO TABS
325.0000 mg | ORAL_TABLET | Freq: Four times a day (QID) | ORAL | Status: DC | PRN
  Administered 2019-11-19 (×2): 650 mg via ORAL
  Filled 2019-11-16 (×2): qty 2

## 2019-11-16 MED ORDER — MOMETASONE FURO-FORMOTEROL FUM 100-5 MCG/ACT IN AERO
2.0000 | INHALATION_SPRAY | Freq: Two times a day (BID) | RESPIRATORY_TRACT | Status: DC
  Administered 2019-11-17 – 2019-11-20 (×7): 2 via RESPIRATORY_TRACT
  Filled 2019-11-16: qty 8.8

## 2019-11-16 MED ORDER — CEFAZOLIN SODIUM-DEXTROSE 2-3 GM-%(50ML) IV SOLR
INTRAVENOUS | Status: DC | PRN
Start: 2019-11-16 — End: 2019-11-16
  Administered 2019-11-16: 2 g via INTRAVENOUS

## 2019-11-16 MED ORDER — DOCUSATE SODIUM 100 MG PO CAPS
100.0000 mg | ORAL_CAPSULE | Freq: Two times a day (BID) | ORAL | Status: DC
  Administered 2019-11-16 – 2019-11-19 (×7): 100 mg via ORAL
  Filled 2019-11-16 (×8): qty 1

## 2019-11-16 MED ORDER — ALUM & MAG HYDROXIDE-SIMETH 200-200-20 MG/5ML PO SUSP
30.0000 mL | ORAL | Status: DC | PRN
  Administered 2019-11-18: 30 mL via ORAL
  Filled 2019-11-16: qty 30

## 2019-11-16 MED ORDER — SODIUM CHLORIDE 0.9 % IR SOLN
Status: DC | PRN
Start: 2019-11-16 — End: 2019-11-16
  Administered 2019-11-16: 3000 mL

## 2019-11-16 MED ORDER — ROCURONIUM BROMIDE 10 MG/ML (PF) SYRINGE
PREFILLED_SYRINGE | INTRAVENOUS | Status: DC | PRN
  Administered 2019-11-16: 70 mg via INTRAVENOUS
  Administered 2019-11-16: 20 mg via INTRAVENOUS
  Administered 2019-11-16: 10 mg via INTRAVENOUS

## 2019-11-16 MED ORDER — ACETAMINOPHEN 325 MG PO TABS
650.0000 mg | ORAL_TABLET | Freq: Three times a day (TID) | ORAL | Status: DC
  Administered 2019-11-17 – 2019-11-20 (×14): 650 mg via ORAL
  Filled 2019-11-16 (×15): qty 2

## 2019-11-16 MED ORDER — ONDANSETRON HCL 4 MG/2ML IJ SOLN
INTRAMUSCULAR | Status: DC | PRN
  Administered 2019-11-16: 4 mg via INTRAVENOUS

## 2019-11-16 MED ORDER — METOCLOPRAMIDE HCL 5 MG/ML IJ SOLN: 5.0000 mg | Freq: Three times a day (TID) | INTRAMUSCULAR | Status: DC | PRN

## 2019-11-16 MED ORDER — ACETAMINOPHEN 500 MG PO TABS: 1000.0000 mg | ORAL_TABLET | Freq: Once | ORAL | Status: DC

## 2019-11-16 MED ORDER — FENTANYL CITRATE (PF) 250 MCG/5ML IJ SOLN
INTRAMUSCULAR | Status: AC
  Filled 2019-11-16: qty 5

## 2019-11-16 MED ORDER — LEVOTHYROXINE SODIUM 50 MCG PO TABS
50.0000 ug | ORAL_TABLET | Freq: Every day | ORAL | Status: DC
  Administered 2019-11-17 – 2019-11-20 (×4): 50 ug via ORAL
  Filled 2019-11-16 (×4): qty 1

## 2019-11-16 MED ORDER — PROPOFOL 10 MG/ML IV BOLUS
INTRAVENOUS | Status: DC | PRN
  Administered 2019-11-16: 100 mg via INTRAVENOUS
  Administered 2019-11-16: 30 mg via INTRAVENOUS

## 2019-11-16 MED ORDER — DEXAMETHASONE SODIUM PHOSPHATE 10 MG/ML IJ SOLN
INTRAMUSCULAR | Status: DC | PRN
  Administered 2019-11-16: 10 mg via INTRAVENOUS

## 2019-11-16 MED ORDER — ONDANSETRON HCL 4 MG/2ML IJ SOLN: 4.0000 mg | Freq: Four times a day (QID) | INTRAMUSCULAR | Status: DC | PRN

## 2019-11-16 MED ORDER — VITAMIN B-12 1000 MCG PO TABS
1000.0000 ug | ORAL_TABLET | ORAL | Status: DC
  Administered 2019-11-18 – 2019-11-20 (×2): 1000 ug via ORAL
  Filled 2019-11-16 (×2): qty 1

## 2019-11-16 MED ORDER — TRAZODONE HCL 50 MG PO TABS: 25.0000 mg | ORAL_TABLET | Freq: Every evening | ORAL | Status: DC | PRN

## 2019-11-16 MED ORDER — BISOPROLOL FUMARATE 5 MG PO TABS
5.0000 mg | ORAL_TABLET | Freq: Every day | ORAL | Status: DC
  Administered 2019-11-17 – 2019-11-20 (×4): 5 mg via ORAL
  Filled 2019-11-16 (×4): qty 1

## 2019-11-16 MED ORDER — LACTATED RINGERS IV SOLN: INTRAVENOUS | Status: DC

## 2019-11-16 MED ORDER — PROPOFOL 10 MG/ML IV BOLUS
INTRAVENOUS | Status: AC
  Filled 2019-11-16: qty 20

## 2019-11-16 MED ORDER — TRANEXAMIC ACID-NACL 1000-0.7 MG/100ML-% IV SOLN
INTRAVENOUS | Status: AC
  Filled 2019-11-16: qty 100

## 2019-11-16 MED ORDER — BISACODYL 10 MG RE SUPP: 10.0000 mg | Freq: Every day | RECTAL | Status: DC | PRN

## 2019-11-16 MED ORDER — FLEET ENEMA 7-19 GM/118ML RE ENEM: 1.0000 | ENEMA | Freq: Once | RECTAL | Status: DC | PRN

## 2019-11-16 MED ORDER — ONDANSETRON HCL 4 MG PO TABS: 4.0000 mg | ORAL_TABLET | Freq: Four times a day (QID) | ORAL | Status: DC | PRN

## 2019-11-16 MED ORDER — 0.9 % SODIUM CHLORIDE (POUR BTL) OPTIME
TOPICAL | Status: DC | PRN
  Administered 2019-11-16: 1000 mL

## 2019-11-16 MED ORDER — BUPIVACAINE HCL (PF) 0.25 % IJ SOLN
INTRAMUSCULAR | Status: AC
  Filled 2019-11-16: qty 30

## 2019-11-16 MED ORDER — BUPIVACAINE HCL (PF) 0.25 % IJ SOLN
INTRAMUSCULAR | Status: DC | PRN
  Administered 2019-11-16: 20 mL

## 2019-11-16 MED ORDER — TRAMADOL HCL 50 MG PO TABS
25.0000 mg | ORAL_TABLET | ORAL | Status: DC | PRN
  Administered 2019-11-16: 50 mg via ORAL
  Filled 2019-11-16: qty 1

## 2019-11-16 MED ORDER — MORPHINE SULFATE (PF) 2 MG/ML IV SOLN: 0.5000 mg | INTRAVENOUS | Status: DC | PRN

## 2019-11-16 MED ORDER — GUAIFENESIN-DM 100-10 MG/5ML PO SYRP: 5.0000 mL | ORAL_SOLUTION | Freq: Four times a day (QID) | ORAL | Status: DC | PRN

## 2019-11-16 MED ORDER — CHLORHEXIDINE GLUCONATE 4 % EX LIQD
60.0000 mL | Freq: Once | CUTANEOUS | Status: AC
  Administered 2019-11-16: 4 via TOPICAL
  Filled 2019-11-16: qty 60

## 2019-11-16 MED ORDER — PROCHLORPERAZINE 25 MG RE SUPP
12.5000 mg | Freq: Four times a day (QID) | RECTAL | Status: DC | PRN
  Filled 2019-11-16: qty 1

## 2019-11-16 MED ORDER — DILTIAZEM HCL ER COATED BEADS 180 MG PO CP24
180.0000 mg | ORAL_CAPSULE | Freq: Every day | ORAL | Status: DC
  Administered 2019-11-17 – 2019-11-19 (×3): 180 mg via ORAL
  Filled 2019-11-16 (×3): qty 1

## 2019-11-16 MED ORDER — PHENOL 1.4 % MT LIQD: 1.0000 | OROMUCOSAL | Status: DC | PRN

## 2019-11-16 MED ORDER — ONDANSETRON HCL 4 MG/2ML IJ SOLN
4.0000 mg | Freq: Once | INTRAMUSCULAR | Status: AC | PRN
  Administered 2019-11-16: 4 mg via INTRAVENOUS

## 2019-11-16 MED ORDER — VANCOMYCIN HCL IN DEXTROSE 1-5 GM/200ML-% IV SOLN
1000.0000 mg | INTRAVENOUS | Status: AC
  Filled 2019-11-16: qty 200

## 2019-11-16 MED ORDER — TRANEXAMIC ACID-NACL 1000-0.7 MG/100ML-% IV SOLN
1000.0000 mg | INTRAVENOUS | Status: AC
  Administered 2019-11-16: 1000 mg via INTRAVENOUS

## 2019-11-16 MED ORDER — FENTANYL CITRATE (PF) 100 MCG/2ML IJ SOLN
INTRAMUSCULAR | Status: DC | PRN
  Administered 2019-11-16: 25 ug via INTRAVENOUS
  Administered 2019-11-16 (×2): 50 ug via INTRAVENOUS
  Administered 2019-11-16 (×2): 25 ug via INTRAVENOUS
  Administered 2019-11-16: 50 ug via INTRAVENOUS
  Administered 2019-11-16: 25 ug via INTRAVENOUS

## 2019-11-16 MED ORDER — PROCHLORPERAZINE EDISYLATE 10 MG/2ML IJ SOLN: 5.0000 mg | Freq: Four times a day (QID) | INTRAMUSCULAR | Status: DC | PRN

## 2019-11-16 MED ORDER — PRO-STAT SUGAR FREE PO LIQD
30.0000 mL | Freq: Two times a day (BID) | ORAL | Status: DC
  Administered 2019-11-17 – 2019-11-20 (×7): 30 mL via ORAL
  Filled 2019-11-16 (×7): qty 30

## 2019-11-16 MED ORDER — FUROSEMIDE 20 MG PO TABS: 20.0000 mg | ORAL_TABLET | Freq: Every day | ORAL | Status: DC | PRN

## 2019-11-16 MED ORDER — HYDROCODONE-ACETAMINOPHEN 5-325 MG PO TABS
1.0000 | ORAL_TABLET | Freq: Four times a day (QID) | ORAL | Status: DC | PRN
  Administered 2019-11-17 – 2019-11-18 (×5): 1 via ORAL
  Filled 2019-11-16 (×5): qty 1

## 2019-11-16 MED ORDER — DEXAMETHASONE SODIUM PHOSPHATE 10 MG/ML IJ SOLN
INTRAMUSCULAR | Status: AC
  Filled 2019-11-16: qty 3

## 2019-11-16 MED ORDER — ONDANSETRON HCL 4 MG/2ML IJ SOLN
INTRAMUSCULAR | Status: AC
  Filled 2019-11-16: qty 2

## 2019-11-16 MED ORDER — SUGAMMADEX SODIUM 200 MG/2ML IV SOLN
INTRAVENOUS | Status: DC | PRN
  Administered 2019-11-16: 200 mg via INTRAVENOUS

## 2019-11-16 MED ORDER — SODIUM CHLORIDE 0.9% IV SOLUTION: Freq: Once | INTRAVENOUS | Status: DC

## 2019-11-16 MED ORDER — DIPHENHYDRAMINE HCL 12.5 MG/5ML PO ELIX: 12.5000 mg | ORAL_SOLUTION | Freq: Four times a day (QID) | ORAL | Status: DC | PRN

## 2019-11-16 MED ORDER — FENTANYL CITRATE (PF) 100 MCG/2ML IJ SOLN: 25.0000 ug | INTRAMUSCULAR | Status: DC | PRN

## 2019-11-16 MED ORDER — SENNA 8.6 MG PO TABS
2.0000 | ORAL_TABLET | Freq: Every day | ORAL | Status: DC
  Administered 2019-11-17 – 2019-11-19 (×3): 17.2 mg via ORAL
  Filled 2019-11-16 (×3): qty 2

## 2019-11-16 MED ORDER — POLYETHYLENE GLYCOL 3350 17 G PO PACK: 17.0000 g | PACK | Freq: Every day | ORAL | Status: DC | PRN

## 2019-11-16 MED ORDER — METOCLOPRAMIDE HCL 5 MG PO TABS: 5.0000 mg | ORAL_TABLET | Freq: Three times a day (TID) | ORAL | Status: DC | PRN

## 2019-11-16 MED ORDER — POLYETHYLENE GLYCOL 3350 17 G PO PACK
17.0000 g | PACK | Freq: Two times a day (BID) | ORAL | Status: DC
  Administered 2019-11-17 – 2019-11-19 (×4): 17 g via ORAL
  Filled 2019-11-16 (×6): qty 1

## 2019-11-16 MED ORDER — PROCHLORPERAZINE MALEATE 5 MG PO TABS
5.0000 mg | ORAL_TABLET | Freq: Four times a day (QID) | ORAL | Status: DC | PRN
  Filled 2019-11-16: qty 2

## 2019-11-16 MED ORDER — FLUOXETINE HCL 10 MG PO CAPS
10.0000 mg | ORAL_CAPSULE | Freq: Every day | ORAL | Status: DC
  Administered 2019-11-17 – 2019-11-19 (×3): 10 mg via ORAL
  Filled 2019-11-16 (×5): qty 1

## 2019-11-16 MED ORDER — POVIDONE-IODINE 10 % EX SWAB: 2.0000 "application " | Freq: Once | CUTANEOUS | Status: DC

## 2019-11-16 SURGICAL SUPPLY — 71 items
APL SKNCLS STERI-STRIP NONHPOA (GAUZE/BANDAGES/DRESSINGS) ×1
BENZOIN TINCTURE PRP APPL 2/3 (GAUZE/BANDAGES/DRESSINGS) ×2 IMPLANT
BLADE CLIPPER SURG (BLADE) IMPLANT
BRUSH FEMORAL CANAL (MISCELLANEOUS) IMPLANT
CABLE CERLAGE W/CRIMP 1.8 (Cable) ×3 IMPLANT
CEMENT HV SMART SET (Cement) ×4 IMPLANT
CEMENT RESTRICTOR DEPUY SZ 4 (Cement) ×1 IMPLANT
CEMENTRALIZER 10.0MM (Orthopedic Implant) ×1 IMPLANT
COVER BACK TABLE 24X17X13 BIG (DRAPES) ×2 IMPLANT
COVER SURGICAL LIGHT HANDLE (MISCELLANEOUS) ×2 IMPLANT
COVER WAND RF STERILE (DRAPES) ×2 IMPLANT
DRAPE C-ARM 42X72 X-RAY (DRAPES) IMPLANT
DRAPE IMP U-DRAPE 54X76 (DRAPES) ×2 IMPLANT
DRAPE INCISE IOBAN 66X45 STRL (DRAPES) IMPLANT
DRAPE ORTHO SPLIT 77X108 STRL (DRAPES) ×4
DRAPE SURG ORHT 6 SPLT 77X108 (DRAPES) ×2 IMPLANT
DRAPE U-SHAPE 47X51 STRL (DRAPES) ×2 IMPLANT
DRSG PAD ABDOMINAL 8X10 ST (GAUZE/BANDAGES/DRESSINGS) ×3 IMPLANT
DURAPREP 26ML APPLICATOR (WOUND CARE) ×2 IMPLANT
ELECT CAUTERY BLADE 6.4 (BLADE) ×2 IMPLANT
ELECT REM PT RETURN 9FT ADLT (ELECTROSURGICAL) ×2
ELECTRODE REM PT RTRN 9FT ADLT (ELECTROSURGICAL) ×1 IMPLANT
EVACUATOR 1/8 PVC DRAIN (DRAIN) IMPLANT
FACESHIELD WRAPAROUND (MASK) IMPLANT
FACESHIELD WRAPAROUND OR TEAM (MASK) ×2 IMPLANT
GAUZE SPONGE 4X4 12PLY STRL (GAUZE/BANDAGES/DRESSINGS) ×2 IMPLANT
GAUZE XEROFORM 5X9 LF (GAUZE/BANDAGES/DRESSINGS) ×2 IMPLANT
GLOVE BIOGEL PI IND STRL 8 (GLOVE) ×2 IMPLANT
GLOVE BIOGEL PI INDICATOR 8 (GLOVE) ×2
GLOVE ORTHO TXT STRL SZ7.5 (GLOVE) ×4 IMPLANT
GOWN STRL REUS W/ TWL LRG LVL3 (GOWN DISPOSABLE) ×1 IMPLANT
GOWN STRL REUS W/ TWL XL LVL3 (GOWN DISPOSABLE) ×1 IMPLANT
GOWN STRL REUS W/TWL 2XL LVL3 (GOWN DISPOSABLE) ×2 IMPLANT
GOWN STRL REUS W/TWL LRG LVL3 (GOWN DISPOSABLE) ×2
GOWN STRL REUS W/TWL XL LVL3 (GOWN DISPOSABLE) ×2
HANDPIECE INTERPULSE COAX TIP (DISPOSABLE)
IMMOBILIZER KNEE 20 (SOFTGOODS)
IMMOBILIZER KNEE 20 THIGH 36 (SOFTGOODS) IMPLANT
IMMOBILIZER KNEE 22 UNIV (SOFTGOODS) IMPLANT
IMMOBILIZER KNEE 24 THIGH 36 (MISCELLANEOUS) IMPLANT
IMMOBILIZER KNEE 24 UNIV (MISCELLANEOUS)
KIT BASIN OR (CUSTOM PROCEDURE TRAY) ×2 IMPLANT
KIT TURNOVER KIT B (KITS) ×2 IMPLANT
MANIFOLD NEPTUNE II (INSTRUMENTS) ×2 IMPLANT
NDL 1/2 CIR MAYO (NEEDLE) ×1 IMPLANT
NDL HYPO 25GX1X1/2 BEV (NEEDLE) ×1 IMPLANT
NEEDLE 1/2 CIR MAYO (NEEDLE) ×2 IMPLANT
NEEDLE HYPO 25GX1X1/2 BEV (NEEDLE) ×2 IMPLANT
NS IRRIG 1000ML POUR BTL (IV SOLUTION) ×2 IMPLANT
PACK TOTAL JOINT (CUSTOM PROCEDURE TRAY) ×2 IMPLANT
PACK UNIVERSAL I (CUSTOM PROCEDURE TRAY) ×2 IMPLANT
PAD ARMBOARD 7.5X6 YLW CONV (MISCELLANEOUS) ×4 IMPLANT
SET HNDPC FAN SPRY TIP SCT (DISPOSABLE) IMPLANT
SPONGE LAP 4X18 RFD (DISPOSABLE) ×4 IMPLANT
STAPLER VISISTAT 35W (STAPLE) ×3 IMPLANT
STEM ENDURANCE STRAIGHT SZ2 (Stem) ×1 IMPLANT
SUCTION FRAZIER HANDLE 10FR (MISCELLANEOUS) ×2
SUCTION TUBE FRAZIER 10FR DISP (MISCELLANEOUS) ×1 IMPLANT
SUT ETHIBOND NAB CT1 #1 30IN (SUTURE) ×3 IMPLANT
SUT TICRON (SUTURE) ×2 IMPLANT
SUT VIC AB 1 CTX 36 (SUTURE) ×2
SUT VIC AB 1 CTX36XBRD ANBCTRL (SUTURE) IMPLANT
SUT VIC AB 2-0 CT1 27 (SUTURE) ×4
SUT VIC AB 2-0 CT1 TAPERPNT 27 (SUTURE) ×3 IMPLANT
SUT VICRYL 0 TIES 12 18 (SUTURE) ×1 IMPLANT
SYR CONTROL 10ML LL (SYRINGE) ×2 IMPLANT
TOWEL GREEN STERILE (TOWEL DISPOSABLE) ×2 IMPLANT
TOWEL GREEN STERILE FF (TOWEL DISPOSABLE) ×2 IMPLANT
TOWER CARTRIDGE SMART MIX (DISPOSABLE) IMPLANT
TRAY FOLEY MTR SLVR 16FR STAT (SET/KITS/TRAYS/PACK) IMPLANT
WATER STERILE IRR 1000ML POUR (IV SOLUTION) ×8 IMPLANT

## 2019-11-16 NOTE — Plan of Care (Signed)
  Problem: Education: Goal: Knowledge of General Education information will improve Description: Including pain rating scale, medication(s)/side effects and non-pharmacologic comfort measures Outcome: Progressing   Problem: Clinical Measurements: Goal: Respiratory complications will improve Outcome: Progressing Note: On room air   Problem: Coping: Goal: Level of anxiety will decrease Outcome: Progressing   Problem: Pain Managment: Goal: General experience of comfort will improve Outcome: Progressing Note: Treated right hip pain once with tramadol prior to surgery   Problem: Safety: Goal: Ability to remain free from injury will improve Outcome: Progressing

## 2019-11-16 NOTE — Transfer of Care (Signed)
Immediate Anesthesia Transfer of Care Note  Patient: Stephanie Cortez  Procedure(s) Performed: TOTAL HIP REVISION BIPOLAR TO CEMENTED BIPOLAR (Right Hip)  Patient Location: PACU  Anesthesia Type:General  Level of Consciousness: drowsy and patient cooperative  Airway & Oxygen Therapy: Patient Spontanous Breathing and Patient connected to nasal cannula oxygen  Post-op Assessment: Report given to RN and Post -op Vital signs reviewed and stable  Post vital signs: Reviewed and stable  Last Vitals:  Vitals Value Taken Time  BP 101/52 11/16/19 2058  Temp    Pulse    Resp 22 11/16/19 2100  SpO2    Vitals shown include unvalidated device data.  Last Pain:  Vitals:   11/16/19 1249  TempSrc:   PainSc: 7       Patients Stated Pain Goal: 3 ([id number])  Complications: No apparent anesthesia complications

## 2019-11-16 NOTE — Progress Notes (Signed)
Pt was D/C from unit to 5N-01. Report was given to RN. Pt was transported with daughter and all personal belongings. Pt has been NPO since midnight as per order. Doy Hutching, LPN

## 2019-11-16 NOTE — Anesthesia Postprocedure Evaluation (Signed)
Anesthesia Post Note  Patient: Stephanie Cortez  Procedure(s) Performed: TOTAL HIP REVISION BIPOLAR TO CEMENTED BIPOLAR (Right Hip)     Patient location during evaluation: PACU Anesthesia Type: General Level of consciousness: awake and alert Pain management: pain level controlled Vital Signs Assessment: post-procedure vital signs reviewed and stable Respiratory status: spontaneous breathing, nonlabored ventilation, respiratory function stable and patient connected to nasal cannula oxygen Cardiovascular status: blood pressure returned to baseline and stable Postop Assessment: no apparent nausea or vomiting Anesthetic complications: no    Last Vitals:  Vitals:   11/16/19 2115 11/16/19 2130  BP: (!) 100/48 111/67  Pulse: 76 78  Resp: (!) 21 (!) 33  Temp:    SpO2: 96% 95%    Last Pain:  Vitals:   11/16/19 2130  TempSrc:   PainSc: 4                  Catalina Gravel

## 2019-11-16 NOTE — Progress Notes (Signed)
ANTICOAGULATION CONSULT NOTE - Follow Up Consult  Pharmacy Consult for warfarin >> heparin Indication: atrial fibrillation, s/p R THA  Patient Measurements: Height: 5\' 6"  (167.6 cm) Weight: 84.2 kg (185 lb 10 oz) IBW/kg (Calculated) : 59.3  Vital Signs: Temp: 98 F (36.7 C) (05/17 0458) BP: 121/58 (05/17 0500) Pulse Rate: 72 (05/17 0500)  Labs: Recent Labs    11/14/19 0700 11/14/19 1634 11/15/19 0718 11/15/19 1351 11/16/19 0545  HGB  --   --  11.5*  --  10.9*  HCT  --   --  36.4  --  34.7*  PLT  --   --  411*  --  382  LABPROT 19.6*  --  17.9*  --  17.1*  INR 1.7*  --  1.5*  --  1.5*  HEPARINUNFRC  --    < > 0.87* 0.66 0.49  CREATININE  --   --   --   --  0.65   < > = values in this interval not displayed.    Estimated Creatinine Clearance: 57.3 mL/min (by C-G formula based on SCr of 0.65 mg/dL).  Assessment: 86 year old W on warfarin PTA for hx of atrial fibrillation (CHADS2VASc = 4). Pt admitted for fall d/t hip fracture, underwent right hip arthroplasty on 4/28. Pharmacy consulted 10/30/19 to resume warfarin. Patient planned for orthopedic revision surgery on 5/17 so warfarin was held starting 5/13 and pharmacy consulted to start heparin.   PTA warfarin dose = 1.25 mg on MWF, 2.5 mg all other days    Heparin level this AM at goal 0.49 on 1050 units/hr. INR remains at 1.5. Heparin stopped for OR this morning. Will f/u plan post-op.   Goal of Therapy:  Heparin Level 0.3 -0.7 units/mL Antiplatelet monitoring per protocol   Plan:  Stop heparin for OR F/u anticoagulation plan post-operation  Continue holding warfarin until cleared by Ortho to restart Daily HL and CBC Monitor for signs/symptoms of bleeding   Benetta Spar, PharmD, BCPS, BCCP Clinical Pharmacist  Please check AMION for all [facility] phone numbers After 10:00 PM, call [place]

## 2019-11-16 NOTE — Progress Notes (Signed)
ANTICOAGULATION CONSULT NOTE - Follow Up Consult  Pharmacy Consult for warfarin >> heparin Indication: atrial fibrillation, s/p R THA  Patient Measurements: Height: 5\' 6"  (167.6 cm) Weight: 84.2 kg (185 lb 10 oz) IBW/kg (Calculated) : 59.3  Vital Signs: Temp: 98.1 F (36.7 C) (05/17 1153) Temp Source: Oral (05/17 1153) BP: 135/56 (05/17 1153) Pulse Rate: 73 (05/17 1153)  Labs: Recent Labs    11/14/19 0700 11/14/19 1634 11/15/19 0718 11/15/19 1351 11/16/19 0545  HGB  --   --  11.5*  --  10.9*  HCT  --   --  36.4  --  34.7*  PLT  --   --  411*  --  382  LABPROT 19.6*  --  17.9*  --  17.1*  INR 1.7*  --  1.5*  --  1.5*  HEPARINUNFRC  --    < > 0.87* 0.66 0.49  CREATININE  --   --   --   --  0.65   < > = values in this interval not displayed.    Estimated Creatinine Clearance: 57.3 mL/min (by C-G formula based on SCr of 0.65 mg/dL).  Assessment: 86 year old W on warfarin PTA for hx of atrial fibrillation (CHADS2VASc = 4). Pt admitted for fall d/t hip fracture, underwent right hip arthroplasty on 4/28. Pharmacy consulted 10/30/19 to resume warfarin. Patient planned for orthopedic revision surgery on 5/17 so warfarin was held starting 5/13 and pharmacy consulted to start heparin.   PTA warfarin dose = 1.25 mg on MWF, 2.5 mg all other days    Received consult to resume Warfarin dosing this evening. Contacted Dr. Lorin Mercy to discuss if heparin should be resumed for bridging while INR<2. Per that conversation - given delayed OR time slot and risk of bleeding, no heparin post-op and to push warfarin restart to 5/18 evening "without a load" per Dr. Lorin Mercy.   Goal of Therapy:  Heparin Level 0.3 -0.7 units/mL Antiplatelet monitoring per protocol   Plan:  - Warfarin dosing to resume on 5/18 evening - No heparin bridge post-op for now per Dr. Lorin Mercy - Will consider readdressing if patient continues with INR<2 for a prolonged period - Daily HL and CBC, will monitor for bleeding  Thank  you for allowing pharmacy to be a part of this patient's care.  Alycia Rossetti, PharmD, BCPS Clinical Pharmacist Clinical phone for 11/16/2019: [phone] 11/16/2019 5:20 PM   **Pharmacist phone directory can now be found on [email or web address] ([email or web address]).  Listed under [place].

## 2019-11-16 NOTE — H&P (Signed)
Stephanie Cortez is an 86 y.o. female.   Chief Complaint: right hemiarthroplasty on 10/28/19 with periprosthetic femur fracture with subsided unstable stem HPI: 86 year old female underwent press-fit bipolar hemiarthroplasty 10/28/2019 which she tolerated well.  Postoperatively she was walking in the room short distance in the hall and was transferred to rehabilitation floor at [facility] for further rehab.  She had increased pain decreased ability to ambulate more problems with transfers and x-ray from last week showed periprosthetic femur fracture with medial butterfly fragment of the lesser trochanter and subsidence of the prosthesis with shortening of about 1 inch.  Hip was still reduced.  Patient returns operating room for revision due to periprosthetic fracture. Patient has history of atrial fibrillation and has been on Coumadin.  This stopped last week vitamin K was given INR now 1.5 and she has been on heparin drip until this morning for bridging. Past Medical History:  Diagnosis Date  . Anemia    - Hgb 9.7gm% on 07/13/2008 in [place] -  Hgg 129gm% wiht normal irone levsl and ferritin 10/27/2008 in [place] Recurrent otitis/sinusitis  . Anxiety    chronic BZ prn  . Atrial fibrillation (HCC)    chronic anticoag  . Bronchiectasis    >PFT 07/13/2008 in [place] 1.9L/76%, FVC 2.45L/74, Ratio 79, TLC 121%, DLCO 64%  AE BRonchiectasis - Dec 2010.New Rx:  outpatient - Feb 2011 - Rx outpatient  . CHF (congestive heart failure) ([place])   . COPD (chronic obstructive pulmonary disease) (HCC)    bronchiectasis  . Cricopharyngeal achalasia   . Depression   . Diverticulosis   . Dyslipidemia   . Eczema   . GERD with stricture   . Glaucoma   . H. pylori infection   . Hypertension   . Hyponatremia    chronic, s/p endo eval 06/2012  . Hypothyroid   . IBS (irritable bowel syndrome)   . Lumbar disc disease   . Neuropathy of both feet   . Segmental colitis ([place])   . Vertigo   . Wears glasses     Past Surgical  History:  Procedure Laterality Date  . BREAST SURGERY     br bx  . CARDIAC CATHETERIZATION  07/01/2013  . CATARACT EXTRACTION     both  . COLONOSCOPY    . ESOPHAGOSCOPY W/ BOTOX INJECTION  12/11/2011   Procedure: ESOPHAGOSCOPY WITH BOTOX INJECTION;  Surgeon: Rozetta Nunnery, MD;  Location: [facility];  Service: ENT;  Laterality: N/A;  esophogoscopy with dilation, botox injection  . FOOT SURGERY  03/14/2011   gastroc slide-rt  . HIP ARTHROPLASTY Right 10/28/2019   Procedure: ARTHROPLASTY BIPOLAR HIP (HEMIARTHROPLASTY);  Surgeon: Marybelle Killings, MD;  Location: [facility];  Service: Orthopedics;  Laterality: Right;  . TOTAL ABDOMINAL HYSTERECTOMY    . WISDOM TOOTH EXTRACTION      Family History  Problem Relation Age of Onset  . Hypertension Mother   . Stroke Mother   . Emphysema Brother   . Other Father        miner's lung  . Cancer Father        Lung  . Colon polyps Sister   . Pancreatic cancer Sister   . Kidney disease Sister   . Atrial fibrillation Other        siblings   Social History:  reports that she has never smoked. She has never used smokeless tobacco. She reports that she does not drink alcohol or use drugs.  Allergies:  Allergies  Allergen Reactions  . Doxycycline     Rash on face and neck  . Flagyl [Metronidazole] Nausea And Vomiting  . Moxifloxacin Other (See Comments)     Weakness/fatigue  . Pantoprazole Sodium Rash  . Penicillins Rash    Has patient had a PCN reaction causing immediate rash, facial/tongue/throat swelling, SOB or lightheadedness with hypotension:unsure Has patient had a PCN reaction causing severe rash involving mucus membranes or skin necrosis:unsure Has patient had a PCN reaction that required hospitalization:No Has patient had a PCN reaction occurring within the last 10 years:Yes Cannot recall exact reaction due to time lapse If all of the above answers are "NO", then may proceed with Cephalosporin use.     Medications  Prior to Admission  Medication Sig Dispense Refill  . acetaminophen (TYLENOL) 325 MG tablet Take 2 tablets (650 mg total) by mouth 4 (four) times daily -  with meals and at bedtime.    . Biotin 5000 MCG TABS Take 5,000 mcg by mouth daily.    . bisoprolol (ZEBETA) 10 MG tablet Take 1 tablet by mouth daily. Please monitor your blood pressure and heart rate daily for a couple of weeks to make sure they remain stable. (Patient taking differently: Take 10 mg by mouth daily. Please monitor your blood pressure and heart rate daily for a couple of weeks to make sure they remain stable.) 90 tablet 1  . Cholecalciferol (VITAMIN D-3) 125 MCG (5000 UT) TABS Take 5,000 Units by mouth daily.     [name] Kitchen dextromethorphan-guaiFENesin (MUCINEX DM) 30-600 MG 12hr tablet Take 1-2 tablets by mouth 2 (two) times daily as needed (for allegies/respiratory issues.).    [name] Kitchen diltiazem (CARDIZEM CD) 240 MG 24 hr capsule Take 1 capsule (240 mg total) by mouth daily. (Patient taking differently: Take 240 mg by mouth at bedtime. ) 90 capsule 1  . diphenhydrAMINE (BENADRYL) 25 MG tablet Take 25 mg by mouth every 6 (six) hours as needed for allergies.    [name] Kitchen docusate sodium (COLACE) 100 MG capsule Take 1 capsule (100 mg total) by mouth 2 (two) times daily. 10 capsule 0  . FLUoxetine (PROZAC) 10 MG tablet Take 10 mg by mouth 2 (two) times daily as needed (anxiety).     . fluticasone (FLONASE) 50 MCG/ACT nasal spray Place 1 spray into both nostrils 2 (two) times daily as needed (for nasal congestion).    . folic acid (FOLVITE) A999333 MCG tablet Take 1 mg by mouth 3 (three) times a week. MWF    . furosemide (LASIX) 20 MG tablet Take 20 mg by mouth daily as needed for fluid.     [name] Kitchen HYDROcodone-acetaminophen (NORCO/VICODIN) 5-325 MG tablet Take 1-2 tablets by mouth every 6 (six) hours as needed for moderate pain. 30 tablet 0  . levothyroxine (SYNTHROID, LEVOTHROID) 50 MCG tablet Take 1 tablet (50 mcg total) by mouth daily. (Patient taking differently:  Take 50 mcg by mouth daily before breakfast. ) 90 tablet 3  . olmesartan (BENICAR) 40 MG tablet Take 40 mg by mouth daily.    [name] Kitchen omeprazole (PRILOSEC) 20 MG capsule TAKE 1 CAPSULE(20 MG) BY MOUTH DAILY (Patient taking differently: Take 20 mg by mouth daily. ) 90 capsule 4  . pravastatin (PRAVACHOL) 40 MG tablet Take 40 mg by mouth at bedtime.     . SYMBICORT 80-4.5 MCG/ACT inhaler INHALE 2 PUFFS INTO THE LUNGS TWICE DAILY (Patient taking differently: Inhale 2 puffs into the lungs in the morning and at bedtime. ) 10.2 g 3  .  vitamin B-12 (CYANOCOBALAMIN) 1000 MCG tablet Take 1,000 mcg by mouth 3 (three) times a week. MWF    . warfarin (COUMADIN) 2.5 MG tablet Take 1/2-1 tablet by mouth daily as directed by [facility] (Patient taking differently: Take 1.25-2.5 mg by mouth See admin instructions. Take 1 tablet (2.5 mg)  Tuesday Thursday & Saturday Sunday Take 0.5 tablet (1.25 mg) Monday. Wednesday & Friday) 90 tablet 1  . zinc gluconate 50 MG tablet Take 50 mg by mouth daily.      Results for orders placed or performed during the hospital encounter of 10/30/19 (from the past 48 hour(s))  Heparin level (unfractionated)     Status: None   Collection Time: 11/14/19  4:34 PM  Result Value Ref Range   Heparin Unfractionated 0.41 0.30 - 0.70 IU/mL    Comment: (NOTE) If heparin results are below expected values, and patient dosage has  been confirmed, suggest follow up testing of antithrombin III levels. Performed at [facility], [place] [address] [place], [place] [address]   CBC     Status: Abnormal   Collection Time: 11/15/19  7:18 AM  Result Value Ref Range   WBC 7.4 4.0 - 10.5 K/uL   RBC 3.75 (L) 3.87 - 5.11 MIL/uL   Hemoglobin 11.5 (L) 12.0 - 15.0 g/dL   HCT 36.4 36.0 - 46.0 %   MCV 97.1 80.0 - 100.0 fL   MCH 30.7 26.0 - 34.0 pg   MCHC 31.6 30.0 - 36.0 g/dL   RDW 13.5 11.5 - 15.5 %   Platelets 411 (H) 150 - 400 K/uL   nRBC 0.0 0.0 - 0.2 %    Comment: Performed at [facility], [place] [address], [place], [place] [address]  Protime-INR     Status: Abnormal   Collection Time: 11/15/19  7:18 AM  Result Value Ref Range   Prothrombin Time 17.9 (H) 11.4 - 15.2 seconds   INR 1.5 (H) 0.8 - 1.2    Comment: (NOTE) INR goal varies based on device and disease states. Performed at [facility], [place] [address] [place], [place] [address]   VITAMIN D 25 Hydroxy (Vit-D Deficiency, Fractures)     Status: None   Collection Time: 11/15/19  7:18 AM  Result Value Ref Range   Vit D, 25-Hydroxy 34.60 30 - 100 ng/mL    Comment: (NOTE) Vitamin D deficiency has been defined by the [place] practice guideline as a level of serum 25-OH  vitamin D less than 20 ng/mL (1,2). The Endocrine Society went on to  further define vitamin D insufficiency as a level between 21 and 29  ng/mL (2). 1. IOM (Institute of Medicine). 2010. Dietary reference intakes for  calcium and D. [place]: The [employer]. 2. Holick MF, Binkley [place], Bischoff-Ferrari HA, et al. Evaluation,  treatment, and prevention of vitamin D deficiency: an Endocrine  Society clinical practice guideline, JCEM. 2011 Jul; 96(7): 1911-30. Performed at [facility], [place] [address], [place], [place] [address]   Heparin level (unfractionated)     Status: Abnormal   Collection Time: 11/15/19  7:18 AM  Result Value Ref Range   Heparin Unfractionated 0.87 (H) 0.30 - 0.70 IU/mL    Comment: (NOTE) If heparin results are below expected values, and patient dosage has  been confirmed, suggest follow up testing of antithrombin III levels. Performed at [facility], [place] [address], [place], [place]  [id number]   Heparin level (unfractionated)     Status: None   Collection Time: 11/15/19  1:51 PM  Result Value Ref Range   Heparin Unfractionated 0.66 0.30 - 0.70 IU/mL    Comment: (NOTE) If heparin results are below expected values, and patient dosage has   been confirmed, suggest follow up testing of antithrombin III levels. Performed at [facility], [place] [address], [place], [place] [address]   Comprehensive metabolic panel     Status: Abnormal   Collection Time: 11/16/19  5:45 AM  Result Value Ref Range   Sodium 133 (L) 135 - 145 mmol/L   Potassium 4.6 3.5 - 5.1 mmol/L   Chloride 102 98 - 111 mmol/L   CO2 24 22 - 32 mmol/L   Glucose, Bld 107 (H) 70 - 99 mg/dL    Comment: Glucose reference range applies only to samples taken after fasting for at least 8 hours.   BUN 13 8 - 23 mg/dL   Creatinine, Ser 0.65 0.44 - 1.00 mg/dL   Calcium 8.9 8.9 - 10.3 mg/dL   Total Protein 5.7 (L) 6.5 - 8.1 g/dL   Albumin 2.4 (L) 3.5 - 5.0 g/dL   AST 24 15 - 41 U/L   ALT 18 0 - 44 U/L   Alkaline Phosphatase 149 (H) 38 - 126 U/L   Total Bilirubin 0.4 0.3 - 1.2 mg/dL   GFR calc non Af Amer >60 >60 mL/min   GFR calc Af Amer >60 >60 mL/min   Anion gap 7 5 - 15    Comment: Performed at [facility] [address], [place], [place] [address]  Protime-INR     Status: Abnormal   Collection Time: 11/16/19  5:45 AM  Result Value Ref Range   Prothrombin Time 17.1 (H) 11.4 - 15.2 seconds   INR 1.5 (H) 0.8 - 1.2    Comment: (NOTE) INR goal varies based on device and disease states. Performed at [facility], [place] [address] [place], [place] [address]   CBC with Differential/Platelet     Status: Abnormal   Collection Time: 11/16/19  5:45 AM  Result Value Ref Range   WBC 7.4 4.0 - 10.5 K/uL   RBC 3.63 (L) 3.87 - 5.11 MIL/uL   Hemoglobin 10.9 (L) 12.0 - 15.0 g/dL   HCT 34.7 (L) 36.0 - 46.0 %   MCV 95.6 80.0 - 100.0 fL   MCH 30.0 26.0 - 34.0 pg   MCHC 31.4 30.0 - 36.0 g/dL   RDW 13.6 11.5 - 15.5 %   Platelets 382 150 - 400 K/uL   nRBC 0.0 0.0 - 0.2 %   Neutrophils Relative % 58 %   Neutro Abs 4.3 1.7 - 7.7 K/uL   Lymphocytes Relative 17 %   Lymphs Abs 1.3 0.7 - 4.0 K/uL   Monocytes Relative 14 %   Monocytes Absolute 1.0 0.1 - 1.0  K/uL   Eosinophils Relative 10 %   Eosinophils Absolute 0.7 (H) 0.0 - 0.5 K/uL   Basophils Relative 1 %   Basophils Absolute 0.1 0.0 - 0.1 K/uL   Immature Granulocytes 0 %   Abs Immature Granulocytes 0.03 0.00 - 0.07 K/uL    Comment: Performed at [facility], [place] [address], [place], [place] [address]  Heparin level (unfractionated)     Status: None   Collection Time: 11/16/19  5:45 AM  Result Value Ref Range   Heparin Unfractionated 0.49 0.30 - 0.70 IU/mL  Comment: (NOTE) If heparin results are below expected values, and patient dosage has  been confirmed, suggest follow up testing of antithrombin III levels. Performed at [facility], [place] [address] [place], [place] [address]   Type and screen [place]     Status: None   Collection Time: 11/16/19  8:45 AM  Result Value Ref Range   ABO/RH(D) O POS    Antibody Screen NEG    Sample Expiration      11/19/2019,2359 Performed at [facility], [place] [address], [place], [place] [address]    DG Chest 2 View  Result Date: 11/15/2019 CLINICAL DATA:  Bronchiectasis EXAM: CHEST - 2 VIEW COMPARISON:  Chest x-ray dated 11/02/2019 chest CT dated 07/17/2019 FINDINGS: Stable patchy airspace opacities within the upper lungs bilaterally, LEFT slightly greater than RIGHT. No new lung findings. Stable cardiomegaly. Chronic deformity of the LEFT humeral head/neck. No acute appearing osseous abnormality. IMPRESSION: 1. Stable patchy airspace opacities within the upper lungs bilaterally, LEFT slightly greater than RIGHT, corresponding to areas of chronic interstitial lung disease (chronic hypersensitivity pneumonitis) described on earlier chest CT report of 07/17/2019. 2. No acute findings. 3. Stable cardiomegaly. Electronically Signed   By: Franki Cabot M.D.   On: 11/15/2019 09:57    Review of Systems unchanged from her surgery date 10/28/2019 other than as mentioned in HPI. Blood pressure (!) 135/56, pulse 73,  temperature 98.1 F (36.7 C), temperature source Oral, resp. rate 15, height 5\' 6"  (1.676 m), weight 84.2 kg, SpO2 95 %. Physical Exam  Constitutional: She appears well-developed and well-nourished.  HENT:  Head: Normocephalic.  Eyes: Pupils are equal, round, and reactive to light. Conjunctivae and EOM are normal.  Neck: No tracheal deviation present. No thyromegaly present.  Cardiovascular:  Atrial fib  Respiratory: Effort normal. No respiratory distress. She has no wheezes.  GI: Soft. She exhibits no distension. There is no abdominal tenderness.  Musculoskeletal:     Cervical back: Normal range of motion.     Comments: Pain with right LE hip active motion. Right LE short sciatic motor intact. Sensation to foot intact. Intact stapes right hip  Skin: Skin is warm. No rash noted. No erythema.  Psychiatric: She has a normal mood and affect. Her behavior is normal. Judgment and thought content normal.     Assessment/Plan Plan right femoral stem revision with cemented stem cables for the lesser trochanteric butterfly fragment.  Discussed with patient as well as family previously planned revision.  Risk for periprosthetic fracture fracture of the distal stem, hip dislocation, infection possible need for [place] procedure all discussed in detail.  They understand request to proceed.  Marybelle Killings, MD 11/16/2019, 12:52 PM

## 2019-11-16 NOTE — Progress Notes (Signed)
PT Cancellation Note  Patient Details Name: Stephanie Cortez MRN: [id number] DOB: 07/30/1933   Cancelled Treatment:    Reason Eval/Treat Not Completed: Patient at procedure or test/unavailable - plan for periprosthetic fracture repair in OR today, will check back when appropriate.  [place] Pager [phone]  Office [place] 11/16/2019, 1:26 PM

## 2019-11-16 NOTE — Discharge Summary (Addendum)
Physician Discharge Summary  Patient ID: Stephanie Cortez MRN: [id number] DOB/AGE: Jun 25, 1934 86 y.o.  Admit date: 10/30/2019 Discharge date: 11/16/2019  Discharge Diagnoses:  Principal Problem:   Periprosthetic fracture around internal prosthetic hip joint, initial encounter Active Problems:   ILD (interstitial lung disease) ([place])   Subcapital fracture of femur ([place])   Acute blood loss anemia   Transaminitis   Hypoalbuminemia due to protein-calorie malnutrition (HCC)   Drug induced constipation   Postoperative pain   Chronic anticoagulation   Essential hypertension   Chronic obstructive pulmonary disease ([place])   Discharged Condition: stable   Significant Diagnostic Studies: DG Chest 2 View  Result Date: 11/15/2019 CLINICAL DATA:  Bronchiectasis EXAM: CHEST - 2 VIEW COMPARISON:  Chest x-ray dated 11/02/2019 chest CT dated 07/17/2019 FINDINGS: Stable patchy airspace opacities within the upper lungs bilaterally, LEFT slightly greater than RIGHT. No new lung findings. Stable cardiomegaly. Chronic deformity of the LEFT humeral head/neck. No acute appearing osseous abnormality. IMPRESSION: 1. Stable patchy airspace opacities within the upper lungs bilaterally, LEFT slightly greater than RIGHT, corresponding to areas of chronic interstitial lung disease (chronic hypersensitivity pneumonitis) described on earlier chest CT report of 07/17/2019. 2. No acute findings. 3. Stable cardiomegaly. Electronically Signed   By: Franki Cabot M.D.   On: 11/15/2019 09:57   DG Chest 2 View  Result Date: 11/02/2019 CLINICAL DATA:  Leukocytosis EXAM: CHEST - 2 VIEW COMPARISON:  October 29, 2019 FINDINGS: There remains airspace opacity with consolidation in portions of the left upper lobe. There is a calcified granuloma in this area as well. There is more ill-defined opacity in the right upper lobe and left base regions. There is cardiomegaly with pulmonary vascularity normal. No adenopathy. There is aortic  atherosclerosis. There is a prior fracture of the proximal left humerus, unchanged. IMPRESSION: Multifocal pneumonia with consolidation most notable in the left upper lobe, stable. Stable cardiomegaly. Aortic Atherosclerosis (ICD10-I70.0). Fracture proximal left humerus, essentially unchanged from recent prior chest radiograph. Electronically Signed   By: Lowella Grip III M.D.   On: 11/02/2019 16:24    DG HIP UNILAT WITH PELVIS 2-3 VIEWS RIGHT  Result Date: 11/11/2019 CLINICAL DATA:  Right hip pain after fall 3 days ago. EXAM: DG HIP (WITH OR WITHOUT PELVIS) 2-3V RIGHT COMPARISON:  October 28, 2019. FINDINGS: Status post right total hip arthroplasty. Moderately displaced fracture is seen involving a portion of the proximal right femur including the lesser trochanter medially. This is new since prior exam. IMPRESSION: Moderately displaced proximal right femoral fracture is noted including lesser trochanter medially. Status post right total hip arthroplasty. Electronically Signed   By: Marijo Conception M.D.   On: 11/11/2019 14:52    Labs:  Basic Metabolic Panel: BMP Latest Ref Rng & Units 11/16/2019 11/09/2019 11/06/2019  Glucose 70 - 99 mg/dL 107(H) 108(H) 91  BUN 8 - 23 mg/dL 13 15 5(L)  Creatinine 0.44 - 1.00 mg/dL 0.65 0.70 0.68  BUN/Creat Ratio 12 - 28 - - -  Sodium 135 - 145 mmol/L 133(L) 131(L) 134(L)  Potassium 3.5 - 5.1 mmol/L 4.6 4.3 4.6  Chloride 98 - 111 mmol/L 102 97(L) 101  CO2 22 - 32 mmol/L 24 23 26   Calcium 8.9 - 10.3 mg/dL 8.9 9.2 8.5(L)    CBC: Recent Labs  Lab 11/11/19 1149 11/15/19 0718 11/16/19 0545  WBC 10.6* 7.4 7.4  NEUTROABS  --   --  4.3  HGB 11.9* 11.5* 10.9*  HCT 37.2 36.4 34.7*  MCV 95.9 97.1 95.6  PLT 405* 411* 382    CBG: No results for input(s): GLUCAP in the last 168 hours.  Brief HPI:   Stephanie Cortez is a 86 y.o. female with history of CHF-on Coumadin, chronic diastolic HF, HTN, depression, neuropathy, COPD/bronchiectasis--2 L oxygen at nights;  who tripped and fell over her dog night prior to admission on 10/27/2019 with progressive right hip pain.  Work-up revealed subcapital fracture proximal right femur and she underwent right hip Hemi by Dr. Lorin Mercy on 11/27/2019.  Postop WBAT and Coumadin resumed.  Hospital course significant for issues with hypotension, acute on chronic hyponatremia, increasing oxygen needs felt to be due to fluid overload as well as issues with sundowning.  Therapy evaluations completed revealing decreased endurance, problems with sequencing as well as poor safety awareness.  CIR was recommended due to functional decline   Hospital Course: Stephanie Cortez was admitted to rehab 10/30/2019 for inpatient therapies to consist of PT and OT at least three hours five days a week. Past admission physiatrist, therapy team and rehab RN have worked together to provide customized collaborative inpatient rehab. She continued to require oxygen during the day and IS was encouraged. Her blood pressures were monitored on TID basis and have been on low side. On 05/03, patient had syncope due to vasovagal episode and EKG done revealing A vig with HR of 53. Cardiology consulted and recommended decreasing Diltiazem to 120 mg and bisoprolol to 5 mg daily. Her blood pressures have been running low and diuretics being held. Chronic diastolic CHF has been monitored and no signs of overload noted with daily weights around 185 lbs.    Pulmonary was consulted for input due to ongoing hypoxia and LUL consolidation suspicious for PNA.  Hypoxia felt to be exertional in nature due to deconditioning and monitoring recommended as patient without cough, sputum production or leucocytosis.  Respiratory status has improved and she was no longer requiring oxygen during the day.  Cognition has improved however she continues to have high levels of anxiety.  She was unable to tolerate narcotic pain meds therefore low-dose tramadol and Robaxin were used for pain control.   Follow-up CBC shows acute blood loss anemia stable.  Therapy was ongoing and she was showing gradual improvement in activity but she was found to have increase in external rotation of right lower extremity.  She also reported increase in right hip pain and there were concerns of hip dislocation therefore x-rays of hip done these revealed periprosthetic hip fracture.  Dr. Lorin Mercy was consulted for input and recommended nonweightbearing on RLE.  He discussed surgical v/s non-surgical options and patient elected on surgical repair. Coumadin was held and she was transitioned to IV heparin until INR drifted down. She was discharged to acute services on 11/16/19 for surgery.   Rehab course: During patient's stay in rehab weekly team conferences were held to monitor patient's progress, set goals and discuss barriers to discharge. At admission, patient required mod assist with mobility and max assist with ADL tasks.  During patient's stay in rehab she has shown improvement with activity tolerance, balance, gait has been making progress. She is able to sit at EOB with CGA to complete UB bathing/dressing. She is able to perform stand pivot transfer with RLE TDWB to chair and able to stand for 30 seconds. She requires max cues to maintain THR precautions.  He was able to perform sit to stand transfers with min assist.  No family education done PT discharge to acute services.  Scheduled medications:  . acetaminophen  650 mg Oral TID WC & HS  . bisoprolol  5 mg Oral Daily  . diltiazem  180 mg Oral QHS  . feeding supplement (PRO-STAT SUGAR FREE 64)  30 mL Oral BID  . FLUoxetine  10 mg Oral QHS  . levothyroxine  50 mcg Oral QAC breakfast  . methocarbamol  250 mg Oral QID  . mometasone-formoterol  2 puff Inhalation BID  . polyethylene glycol  17 g Oral BID  . pravastatin  40 mg Oral QHS  . senna  2 tablet Oral QPC supper  . vitamin B-12  1,000 mcg Oral Once per day on Mon Wed Fri    Disposition: Acute hospital  for surgery.   Diet: Heart Healthy.   Special Instructions: 1. Monitor for signs of overload and check daily weights.  2. Oxygen 2 L at bedtime. Encourage IS during the day.  3.  Continue Heparin/coumadin bridge post op and transition to coumadin when able.   Signed: Bary Leriche 11/16/2019, 10:04 AM Patient was seen, face-face, and physical exam performed by me on day of discharge, greater than 30 minutes of total time spent.. Please see progress note from day of discharge as well.  Delice Lesch, MD, ABPMR

## 2019-11-16 NOTE — Anesthesia Preprocedure Evaluation (Addendum)
Anesthesia Evaluation  Patient identified by MRN, date of birth, ID band Patient awake    Reviewed: Allergy & Precautions, NPO status , Patient's Chart, lab work & pertinent test results, reviewed documented beta blocker date and time   Airway Mallampati: II  TM Distance: >3 FB Neck ROM: Full    Dental  (+) Teeth Intact, Dental Advisory Given   Pulmonary asthma , COPD,    breath sounds clear to auscultation       Cardiovascular hypertension, Pt. on medications and Pt. on home beta blockers + CAD and +CHF  + dysrhythmias Atrial Fibrillation + Valvular Problems/Murmurs (mild MR) MR  Rhythm:Irregular Rate:Normal  Afib on coumadin (last dose 5/14)- vit K given, INR today 1.5  Last echo 10/28/19: 1. Left ventricular ejection fraction, by estimation, is 60 to 65%. The  left ventricle has normal function. The left ventricle has no regional  wall motion abnormalities. Unable to assess LV diastolic filling due to  underlying atrial fibrillation.  2. Right ventricular systolic function is moderately reduced. The right  ventricular size is moderately enlarged. There is moderately elevated  pulmonary artery systolic pressure. The estimated right ventricular  systolic pressure is [id number] mmHg.  3. Right atrial size was severely dilated.  4. The mitral valve is normal in structure. Mild mitral valve  regurgitation. No evidence of mitral stenosis.  5. Tricuspid valve regurgitation is mild to moderate.  6. The aortic valve is tricuspid. Aortic valve regurgitation is not  visualized. Mild aortic valve sclerosis is present, with no evidence of  aortic valve stenosis.  7. The inferior vena cava is dilated in size with <50% respiratory  variability, suggesting right atrial pressure of 15 mmHg.    Neuro/Psych PSYCHIATRIC DISORDERS Anxiety Depression Peripheral neuropathy B/L feet Vertigo     GI/Hepatic Neg liver ROS, GERD  Medicated and  Controlled,IBS   Endo/Other  Hypothyroidism   Renal/GU negative Renal ROS  negative genitourinary   Musculoskeletal  (+) Arthritis , Osteoarthritis,  Lumbar disc disease   right hemiarthroplasty on 10/28/19 with periprosthetic femur fracture with subsided unstable stem   Abdominal   Peds negative pediatric ROS (+)  Hematology  (+) Blood dyscrasia, anemia , H/h 10.9/34.7   Anesthesia Other Findings   Reproductive/Obstetrics negative OB ROS                           Anesthesia Physical Anesthesia Plan  ASA: III  Anesthesia Plan: General   Post-op Pain Management:    Induction: Intravenous  PONV Risk Score and Plan: 3 and Ondansetron, Dexamethasone and Treatment may vary due to age or medical condition  Airway Management Planned: Oral ETT  Additional Equipment: None  Intra-op Plan:   Post-operative Plan: Extubation in OR  Informed Consent: I have reviewed the patients History and Physical, chart, labs and discussed the procedure including the risks, benefits and alternatives for the proposed anesthesia with the patient or authorized representative who has indicated his/her understanding and acceptance.     Dental advisory given  Plan Discussed with: CRNA  Anesthesia Plan Comments:         Anesthesia Quick Evaluation

## 2019-11-16 NOTE — Progress Notes (Signed)
[place] PHYSICAL MEDICINE & REHABILITATION PROGRESS NOTE  Subjective/Complaints: Patient seen sitting up in bed this morning.  She states she slept well overnight.  She states he had a good weekend.  She is awaiting surgery.  ROS: Denies CP, SOB, N/V/D  Objective: Vital Signs: Blood pressure (!) 121/58, pulse 72, temperature 98 F (36.7 C), resp. rate 16, height 5\' 6"  (1.676 m), weight 84.2 kg, SpO2 98 %. DG Chest 2 View  Result Date: 11/15/2019 CLINICAL DATA:  Bronchiectasis EXAM: CHEST - 2 VIEW COMPARISON:  Chest x-ray dated 11/02/2019 chest CT dated 07/17/2019 FINDINGS: Stable patchy airspace opacities within the upper lungs bilaterally, LEFT slightly greater than RIGHT. No new lung findings. Stable cardiomegaly. Chronic deformity of the LEFT humeral head/neck. No acute appearing osseous abnormality. IMPRESSION: 1. Stable patchy airspace opacities within the upper lungs bilaterally, LEFT slightly greater than RIGHT, corresponding to areas of chronic interstitial lung disease (chronic hypersensitivity pneumonitis) described on earlier chest CT report of 07/17/2019. 2. No acute findings. 3. Stable cardiomegaly. Electronically Signed   By: Franki Cabot M.D.   On: 11/15/2019 09:57   Recent Labs    11/15/19 0718 11/16/19 0545  WBC 7.4 7.4  HGB 11.5* 10.9*  HCT 36.4 34.7*  PLT 411* 382   Recent Labs    11/16/19 0545  NA 133*  K 4.6  CL 102  CO2 24  GLUCOSE 107*  BUN 13  CREATININE 0.65  CALCIUM 8.9    Physical Exam: BP (!) 121/58 (BP Location: Right Arm)   Pulse 72   Temp 98 F (36.7 C)   Resp 16   Ht 5\' 6"  (1.676 m)   Wt 84.2 kg   LMP  (LMP Unknown)   SpO2 98%   BMI 29.96 kg/m  Constitutional: No distress . Vital signs reviewed. Lying in bed  Constitutional: No distress . Vital signs reviewed. HENT: Normocephalic.  Atraumatic. Eyes: EOMI. No discharge. Cardiovascular: No JVD. Respiratory: Normal effort.  No stridor. GI: Non-distended. Skin: Right hip incision  with dressing C/D/I Psych: Normal mood.  Normal behavior. Musc: Right hip with edema and tenderness  Neurological: Alert Motor: Motor: Left lower extremity: 4+/5 hip flexion, knee extension, 4+/5 ankle dorsiflexion, unchanged Right lower extremity: Hip flexion 2/5 limited by pain, knee extension 3 +/5, ankle dorsiflexion 4/5, stable  Assessment/Plan: 1. Functional deficits secondary to right hip fracture status post right hip hemiarthroplasty which require 3+ hours per day of interdisciplinary therapy in a comprehensive inpatient rehab setting.  Physiatrist is providing close team supervision and 24 hour management of active medical problems listed below.  Physiatrist and rehab team continue to assess barriers to discharge/monitor patient progress toward functional and medical goals  Care Tool:  Bathing    Body parts bathed by patient: Right arm, Left arm, Chest, Abdomen, Front perineal area, Right upper leg, Left upper leg, Face   Body parts bathed by helper: Buttocks, Right lower leg, Left lower leg     Bathing assist Assist Level: Minimal Assistance - Patient > 75%     Upper Body Dressing/Undressing Upper body dressing   What is the patient wearing?: Pull over shirt    Upper body assist Assist Level: Set up assist    Lower Body Dressing/Undressing Lower body dressing      What is the patient wearing?: Pants     Lower body assist Assist for lower body dressing: Moderate Assistance - Patient 50 - 74%     Toileting Toileting    Toileting assist Assist  for toileting: Minimal Assistance - Patient > 75%     Transfers Chair/bed transfer  Transfers assist  Chair/bed transfer activity did not occur: Safety/medical concerns  Chair/bed transfer assist level: Minimal Assistance - Patient > 75%     Locomotion Ambulation   Ambulation assist   Ambulation activity did not occur: Safety/medical concerns  Assist level: Moderate Assistance - Patient 50 -  74% Assistive device: Walker-rolling Max distance: 3ft   Walk 10 feet activity   Assist  Walk 10 feet activity did not occur: Safety/medical concerns  Assist level: Moderate Assistance - Patient - 50 - 74% Assistive device: Walker-rolling   Walk 50 feet activity   Assist Walk 50 feet with 2 turns activity did not occur: Safety/medical concerns  Assist level: Minimal Assistance - Patient > 75% Assistive device: Walker-rolling    Walk 150 feet activity   Assist Walk 150 feet activity did not occur: Safety/medical concerns  Assist level: Minimal Assistance - Patient > 75% Assistive device: Walker-rolling    Walk 10 feet on uneven surface  activity   Assist Walk 10 feet on uneven surfaces activity did not occur: Safety/medical concerns         Wheelchair     Assist Will patient use wheelchair at discharge?: Yes Type of Wheelchair: Manual    Wheelchair assist level: Supervision/Verbal cueing Max wheelchair distance: 60    Wheelchair 50 feet with 2 turns activity    Assist        Assist Level: Supervision/Verbal cueing   Wheelchair 150 feet activity     Assist     Assist Level: Supervision/Verbal cueing      Medical Problem List and Plan: 1.  Decreased endurance, dizziness with activity, problems with sequencing and poor safety awareness secondary to right hip fracture s/p right hip hemiarthroplasty.  Repeat x-ray personally reviewed, and reviewed with PA as well as Ortho.  Discussed periprosthetic fracture and treatment options with Ortho.  Ultimately, after discussion with family, planed to proceed with revision  today  D/c to Ortho today 2.  Antithrombotics: -DVT/anticoagulation:  Pharmaceutical: Hold Coumadin, goal 2-3, for procedure.    Heparin GGT  INR 1.5 on 5/17             -antiplatelet therapy: N/A 3. Pain Management:   Tramadol prn  Tylenol prn.  Added ice prn for local measures.   Oxycodone DC'd due to cognitive side  effects.  K pad added. Can use ice too  Robaxin scheduled on 5/11  controlled with meds on 5/17             Monitor with increased exertion 4. Mood: LCSW to follow for evaluation and support.              -antipsychotic agents: N/A 5. Neuropsych: This patient is ?fully capable of making decisions on her own behalf.  TSH WNL  Home Fluoxetine started on 5/6  Patient will require consistent team support, discussed anxiety and relaxation with patient 6. Skin/Wound Care: Monitor wound daily for healing.  7. Fluids/Electrolytes/Nutrition: Monitor I/O.  NPO at midnight for procedure. Vitamin D level assessed and within normal limits 8. Hyponatremia: Acute on chronic --baseline 130. Will monitor with strict I/Os   Sodium 133 on 5/17  Follow up again later in week 9. CAF: Monitor HR tid-on Cardizem, Zebata   Cardizem increased to 180 on 5/7  Rate controlled on 5/17  See #2             Monitor with  increased activity. 10. Chronic diastolic CHF: I/Os.  Continue irbesartan, Zebeta and pravachol.   Dose adjustments per cards Filed Weights   11/14/19 0624 11/15/19 0529 11/15/19 0800  Weight: 84.1 kg 84.2 kg 84.2 kg   Weights stable on 5/17 11. Bronchiectasis/COPD: On Dulera bid             Weaned supplemental daytime oxygen, continue supplemental oxygen nightly  DuoNebs scheduled with improvement  Bronchoscopy as outpatient per patient  Preop chest x-ray, unchanged  Improving 13. Reactive leucocytosis: Resolved 14. Pre-renal azotemia: Resolved 15. GERD/cricopharyngeal achalasia: Resumed PPI. Needs to be upright for meals.  16. Drug induced constipation: Added senna to colace as had not had BM since admission.              Increase bowel meds  Improving 17.  Essential hypertension  Controlled on 5/17  Orthostatics negative on 5/12  See #9 18.  Moderate hypoalbuminemia  Supplement initiated on 5/1 19.  Transaminitis: Resolved  LFTs elevated within normal limits on 5/7  Continue to  monitor 20.  Acute blood loss anemia  Hemoglobin 10.9 on 5/17 21.  Vasovagal episode  See #10  No further work-up per pulmonology after review of chest x-ray -follow-up as outpatient.  Resolved 22.  Hypoalbuminemia  Supplement initiated on 5/7  23. Leukocytosis: Resolved  WBCs 7.4 on 5/17  Afebrile  > 30 minutes total time spent regarding plans for discharge as well as coordination of care with Ortho regarding revision surgery as well as reviewing imaging results and lab work and discussing future plans with patient.  LOS: 17 days A FACE TO FACE EVALUATION WAS PERFORMED  Ineta Sinning [name] 11/16/2019, 1:16 PM

## 2019-11-16 NOTE — Anesthesia Procedure Notes (Signed)
Procedure Name: Intubation Date/Time: 11/16/2019 6:14 PM Performed by: Clearnce Sorrel, CRNA Pre-anesthesia Checklist: Patient identified, Emergency Drugs available, Suction available, Patient being monitored and Timeout performed Patient Re-evaluated:Patient Re-evaluated prior to induction Oxygen Delivery Method: Circle system utilized Preoxygenation: Pre-oxygenation with 100% oxygen Induction Type: IV induction Ventilation: Mask ventilation without difficulty Laryngoscope Size: Mac and 4 Grade View: Grade I Tube type: Oral Tube size: 7.0 mm Number of attempts: 1 Airway Equipment and Method: Stylet Placement Confirmation: ETT inserted through vocal cords under direct vision,  positive ETCO2 and breath sounds checked- equal and bilateral Secured at: 22 cm Tube secured with: Tape Dental Injury: Teeth and Oropharynx as per pre-operative assessment

## 2019-11-16 NOTE — Interval H&P Note (Signed)
History and Physical Interval Note:  11/16/2019 3:58 PM  Stephanie Cortez  has presented today for surgery, with the diagnosis of RIGHT [place].  The various methods of treatment have been discussed with the patient and family. After consideration of risks, benefits and other options for treatment, the patient has consented to  Procedure(s): TOTAL HIP REVISION BIPOLAR TO CEMENTED BIPOLAR (Right) as a surgical intervention.  The patient's history has been reviewed, patient examined, no change in status, stable for surgery.  I have reviewed the patient's chart and labs.  Questions were answered to the patient's satisfaction.     Marybelle Killings

## 2019-11-16 NOTE — Op Note (Signed)
Preop diagnosis: Right periprosthetic bipolar hemiarthroplasty femur fracture with unstable stem.  Postop diagnosis: Same  Procedure: Revision to cemented bipolar, right hip.  Surgeon: Rodell Perna, MD  Assistant: RNFA  EBL 800 cc  Blood 1 unit packed cells  Procedure: After induction of general anesthesia patient placed laterally with the beanbag careful padding positioning Betty Daidone to frame preop Ancef prophylaxis and TXA.  Prepping with DuraPrep the usual total hip sheets drapes Betadine Steri-Drapes were applied.  Staples were removed after timeout procedure.  Sutures were divided and hip ball was exposed.  Hip ball came out as 1 large solid piece.  Lesser troches was off as a separate piece and cables were passed with the hip down laying on top of the opposite leg with towels between the knees to take the tension off.  Total of 2 cables were passed trying to catch and pull the lesser trochanter using a finger to try to align the piece and then gradually tighten the cables.  Serial x-rays were taken initially as the cable was tightened the cable slipped off the inferior aspect of the lesser troches was repositioned redone.  Eventually was in better position.  Pulse lavage and a trial of a #2 stem.  Lateralizer reamer was used and then Pharmacologist was measured for a #2 DEPUY  Endurance stem.  The previous bipolar head from the previous procedure was disassembled reassembled and after trials were used it was determined that we could get the hip reduced although it was extremely difficult hip was reduced there was good stability.  Pulse lavage cement restrictor placed back to mixing of the cement using a glue gun placing the prosthesis in appropriate anteversion of 20 degrees.  Cement was hard at 15 minutes permanent bipolar was reassembled attached impacted checked to make sure was solid sciatic nerve was protected and the hip was reduced with difficulty using the large bone hook.  Hip was stable.   One final cable was passed for total of 3 cables which were Zimmer recon cables with cramp 1.8 mm cables x3.  Final and was placed right at the calcar level.  Prosthesis was stable quad was not tight.  The vastus lateralis that had been tagged and released off the vastus lateralis tubercle was repaired over the top of the cables.  Multiple sutures were placed total of 6 figure-of-eight sutures with #1 Ethibond.  Tensor fascia was paired with #1 Ethibond.  Gluteus maximus reapproximated with #1 Ethibond.  2-0 Vicryl subtendinous tissue skin staple closure 20 cc Marcaine infiltration postop dressing and knee immobilizer.  Leg lengths were equal static function was intact and patient was transferred recovery room.

## 2019-11-17 LAB — BASIC METABOLIC PANEL
Anion gap: 10 (ref 5–15)
BUN: 12 mg/dL (ref 8–23)
CO2: 22 mmol/L (ref 22–32)
Calcium: 8.8 mg/dL — ABNORMAL LOW (ref 8.9–10.3)
Chloride: 99 mmol/L (ref 98–111)
Creatinine, Ser: 0.65 mg/dL (ref 0.44–1.00)
GFR calc Af Amer: >60 mL/min (ref >60)
GFR calc non Af Amer: >60 mL/min (ref >60)
Glucose, Bld: 153 mg/dL — ABNORMAL HIGH (ref 70–99)
Potassium: 4.9 mmol/L (ref 3.5–5.1)
Sodium: 131 mmol/L — ABNORMAL LOW (ref 135–145)

## 2019-11-17 LAB — CBC
HCT: 37.1 % (ref 36.0–46.0)
Hemoglobin: 11.8 g/dL — ABNORMAL LOW (ref 12.0–15.0)
MCH: 29.6 pg (ref 26.0–34.0)
MCHC: 31.8 g/dL (ref 30.0–36.0)
MCV: 93 fL (ref 80.0–100.0)
Platelets: 354 K/uL (ref 150–400)
RBC: 3.99 MIL/uL (ref 3.87–5.11)
RDW: 14.4 % (ref 11.5–15.5)
WBC: 8.3 K/uL (ref 4.0–10.5)
nRBC: 0 % (ref 0.0–0.2)

## 2019-11-17 LAB — PROTIME-INR
INR: 1.3 — ABNORMAL HIGH (ref 0.8–1.2)
Prothrombin Time: 15.8 s — ABNORMAL HIGH (ref 11.4–15.2)

## 2019-11-17 MED ORDER — WARFARIN - PHARMACIST DOSING INPATIENT: Freq: Every day | Status: DC

## 2019-11-17 MED ORDER — WARFARIN SODIUM 2.5 MG PO TABS
2.5000 mg | ORAL_TABLET | Freq: Once | ORAL | Status: AC
  Administered 2019-11-17: 2.5 mg via ORAL
  Filled 2019-11-17 (×2): qty 1

## 2019-11-17 NOTE — Evaluation (Signed)
Occupational Therapy Evaluation Patient Details Name: Stephanie Cortez MRN: [id number] DOB: 11-07-1933 Today's Date: 11/17/2019    History of Present Illness Patient is a 86 year old female readmitted with Rt periprosthetic fx s/p femoral stem revision 5/17. Pt with fall 4/27 with closed femoral neck fracture s/p R hemiarthoplasty on 4/28 who D/C'd to CIR. PMhx:HTN, AFib,GERD, anxiety, CHF, hearing loss   Clinical Impression   Pt admitted from CIR following total hip revision. Pt currently requires maxA for bed mobility and maxA to stand from elevated EOB, pt attempted to pivot left foot/takes steps but was unable to weight shift for successful completion. While standing reported dizziness with request to return to supine. Following return pt reported dizziness subsided. Spoke with Dr. Lorin Mercy over the phone during session, he reported pt is now WBAT. Pt's daughter present during session and reports family's primary choice for d/c is for pt to return home. Discussed necessary support needed at home and safety concerns based on pt's current level of functioning, daughter reported she would not be able to provide level of support therapist provided this session. However, based on pt's physical limitations this session currently recommend d/c to SNF. Pt and daughter agree with this plan based on pt's status this session. Will continue to follow and adjust pt's d/c venue if necessary.     Follow Up Recommendations  SNF(possibly HHOT pending progress)    Equipment Recommendations  3 in 1 bedside commode    Recommendations for Other Services       Precautions / Restrictions Precautions Precautions: Posterior Hip Precaution Booklet Issued: No Precaution Comments: pt with posterior hip precautions in chart 4/29, maintained precautions Required Braces or Orthoses: Knee Immobilizer - Right Knee Immobilizer - Right: On at all times Restrictions Weight Bearing Restrictions: Yes RLE Weight Bearing:  Weight bearing as tolerated Other Position/Activity Restrictions: per verbal order from Dr. Lorin Mercy      Mobility Bed Mobility Overal bed mobility: Needs Assistance Bed Mobility: Supine to Sit;Sit to Supine     Supine to sit: Max assist;HOB elevated Sit to supine: Max assist;+2 for physical assistance;+2 for safety/equipment;HOB elevated   General bed mobility comments: assist for BLE managment and to progress trunk to upright posture;maxA+2 for return to supine  Transfers Overall transfer level: Needs assistance Equipment used: Rolling walker (2 wheeled) Transfers: Sit to/from Stand Sit to Stand: Max assist         General transfer comment: maxA to stand from elevated surface, cues for body mechanics and hand placement    Balance Overall balance assessment: Needs assistance Sitting-balance support: Single extremity supported;Feet supported Sitting balance-Leahy Scale: Fair Sitting balance - Comments: single UE support for pain management   Standing balance support: Bilateral upper extremity supported;During functional activity Standing balance-Leahy Scale: Poor Standing balance comment: reliant on external support                            ADL either performed or assessed with clinical judgement   ADL Overall ADL's : Needs assistance/impaired Eating/Feeding: Set up;Sitting   Grooming: Set up;Sitting   Upper Body Bathing: Sitting;Set up   Lower Body Bathing: Maximal assistance;Sitting/lateral leans;Sit to/from stand   Upper Body Dressing : Set up;Sitting   Lower Body Dressing: Maximal assistance;Sit to/from stand Lower Body Dressing Details (indicate cue type and reason): maxA to access BLE;maxA to progress into standing   Toilet Transfer Details (indicate cue type and reason): unable this session Toileting- Clothing Manipulation  and Hygiene: Maximal assistance;Sit to/from stand Toileting - Clothing Manipulation Details (indicate cue type and reason):  stood from EOB     Functional mobility during ADLs: Maximal assistance;Rolling walker General ADL Comments: pt limited due to increased dizziness with standing requested to return to supine     Vision Baseline Vision/History: Wears glasses Wears Glasses: Reading only Patient Visual Report: No change from baseline Vision Assessment?: No apparent visual deficits     Perception     Praxis      Pertinent Vitals/Pain Pain Assessment: 0-10 Pain Score: 6  Pain Location: R hip/groin Pain Descriptors / Indicators: Grimacing;Discomfort;Operative site guarding;Spasm Pain Intervention(s): Limited activity within patient's tolerance;Monitored during session     Hand Dominance Right   Extremity/Trunk Assessment Upper Extremity Assessment Upper Extremity Assessment: Generalized weakness   Lower Extremity Assessment Lower Extremity Assessment: Generalized weakness   Cervical / Trunk Assessment Cervical / Trunk Assessment: Kyphotic   Communication Communication Communication: No difficulties   Cognition Arousal/Alertness: Awake/alert Behavior During Therapy: WFL for tasks assessed/performed Overall Cognitive Status: Within Functional Limits for tasks assessed                                     General Comments  SpO2 >93% on 2lnc;HR 121 with sitting EOB    Exercises     Shoulder Instructions      Home Living Family/patient expects to be discharged to:: Inpatient rehab Living Arrangements: Alone Available Help at Discharge: Family Type of Home: House Home Access: Stairs to enter [employer] of Steps: 2 front, 3 in back Entrance Stairs-Rails: None Home Layout: One level     Bathroom Shower/Tub: Occupational psychologist: Handicapped height     Home Equipment: Grab bars - tub/shower;Shower seat - built in      Lives With: Family(son and daughter-in-law)    Prior Functioning/Environment Level of Independence: Independent         Comments: Pt active, still drives and runs her own errands.        OT Problem List: Decreased strength;Decreased activity tolerance;Impaired balance (sitting and/or standing);Decreased safety awareness;Decreased knowledge of use of DME or AE;Decreased knowledge of precautions;Pain      OT Treatment/Interventions: Self-care/ADL training;Therapeutic exercise;Energy conservation;DME and/or AE instruction;Therapeutic activities;Patient/family education;Balance training    OT Goals(Current goals can be found in the care plan section) Acute Rehab OT Goals Patient Stated Goal: Regain IND OT Goal Formulation: With patient Time For Goal Achievement: 12/01/19 Potential to Achieve Goals: Good ADL Goals Pt Will Perform Grooming: standing;with min guard assist Pt Will Perform Lower Body Dressing: with adaptive equipment;sit to/from stand;with min guard assist Pt Will Transfer to Toilet: with min guard assist;bedside commode Pt Will Perform Toileting - Clothing Manipulation and hygiene: with min guard assist;sit to/from stand Additional ADL Goal #1: Pt will progress to EOB with minguard in preparation for ADL.  OT Frequency: Min 2X/week   Barriers to D/C: Inaccessible home environment  stairs to enter       Co-evaluation              AM-PAC OT "6 Clicks" Daily Activity     Outcome Measure Help from another person eating meals?: A Little Help from another person taking care of personal grooming?: A Little Help from another person toileting, which includes using toliet, bedpan, or urinal?: A Lot Help from another person bathing (including washing, rinsing, drying)?: A Lot Help from another  person to put on and taking off regular upper body clothing?: A Little Help from another person to put on and taking off regular lower body clothing?: A Lot 6 Click Score: 15   End of Session Equipment Utilized During Treatment: Rolling walker;Oxygen Nurse Communication: Mobility status  Activity  Tolerance: Patient tolerated treatment well;Patient limited by pain Patient left: with call bell/phone within reach;with family/visitor present;in bed;with bed alarm set  OT Visit Diagnosis: Other abnormalities of gait and mobility (R26.89);Unsteadiness on feet (R26.81);Muscle weakness (generalized) (M62.81);History of falling (Z91.81);Pain Pain - Right/Left: Right Pain - part of body: Hip                Time: [id number] OT Time Calculation (min): 49 min Charges:  OT General Charges $OT Visit: 1 Visit OT Evaluation $OT Eval Moderate Complexity: 1 Mod OT Treatments $Self Care/Home Management : 23-37 mins  Helene Kelp OTR/L Acute Rehabilitation Services Office: [place] 11/17/2019, 10:16 AM

## 2019-11-17 NOTE — Evaluation (Signed)
Physical Therapy Evaluation Patient Details Name: Stephanie Cortez MRN: [id number] DOB: 06/28/1934 Today's Date: 11/17/2019   History of Present Illness  Patient is a 86 year old female readmitted with Rt periprosthetic fx s/p femoral stem revision 5/17. Pt with fall 4/27 with closed femoral neck fracture s/p R hemiarthoplasty on 4/28 who D/C'd to CIR. PMhx:HTN, AFib,GERD, anxiety, CHF, hearing loss  Clinical Impression  Pt supine with report of decreased pain and spasm. Pt with continued orthostatic hypotension limiting mobility this session and unable to progress to OOB. PT with decreased transfers, mobility, cognition and function who will benefit from acute therapy to maximize mobility and independence. Pt self reports that CIR was too intense and she could not do that again with desire to return home. If pt able to significantly advance mobility to meet goals then home may be possible but anticipate SNF for D/C at this time.   Supine 100/53 (68) Sitting 125/59 (77) Standing 57/29 (39) Return to supine 127/76 (92), Hr 98 SpO2 87% on RA with need for 2L maintained with SpO2 90-92%    Follow Up Recommendations SNF;Supervision/Assistance - 24 hour    Equipment Recommendations  Rolling walker with 5" wheels;3in1 (PT)    Recommendations for Other Services       Precautions / Restrictions Precautions Precautions: Posterior Hip;Fall Precaution Booklet Issued: No Precaution Comments: watch BP Required Braces or Orthoses: Knee Immobilizer - Right Knee Immobilizer - Right: Other (comment)(remove for therapy) Restrictions Weight Bearing Restrictions: Yes RLE Weight Bearing: Weight bearing as tolerated Other Position/Activity Restrictions: per verbal order from Dr. Lorin Mercy      Mobility  Bed Mobility Overal bed mobility: Needs Assistance Bed Mobility: Supine to Sit;Sit to Supine     Supine to sit: Mod assist Sit to supine: Max assist   General bed mobility comments: mod assist to  move RLE and elevate trunk to pivot to EOB on right. Return to supine with max assist to control trunk and bring bil LE to surface. Max assist to slide toward [facility]  Transfers Overall transfer level: Needs assistance Equipment used: Rolling walker (2 wheeled) Transfers: Sit to/from Stand Sit to Stand: Min assist         General transfer comment: min assist to rise from elevated surface with cues for sequence and increased time to achieve standing. pt unable to weight shift or side step toward HOB. Pt did not state dizziness but drop in BP noted with pt returned to supine  Ambulation/Gait                Stairs            Wheelchair Mobility    Modified Rankin (Stroke Patients Only)       Balance Overall balance assessment: Needs assistance Sitting-balance support: Single extremity supported;Feet supported Sitting balance-Leahy Scale: Fair Sitting balance - Comments: single UE support for pain management   Standing balance support: Bilateral upper extremity supported;During functional activity Standing balance-Leahy Scale: Poor Standing balance comment: reliant on external support                              Pertinent Vitals/Pain Pain Assessment: 0-10 Pain Score: 6  Pain Location: R hip/groin with mobility Pain Descriptors / Indicators: Guarding;Aching Pain Intervention(s): Limited activity within patient's tolerance;Monitored during session;Repositioned;Premedicated before session    Home Living Family/patient expects to be discharged to:: Private residence Living Arrangements: Alone Available Help at Discharge: Family;Available 24 hours/day Type  of Home: House Home Access: Stairs to enter Entrance Stairs-Rails: None Entrance Stairs-Number of Steps: 2 front, 3 in back Home Layout: One level Home Equipment: Grab bars - tub/shower;Shower seat - built in      Prior Function Level of Independence: Independent         Comments: Pt active,  still drives and runs her own errands.     Hand Dominance   Dominant Hand: Right    Extremity/Trunk Assessment   Upper Extremity Assessment Upper Extremity Assessment: Generalized weakness    Lower Extremity Assessment Lower Extremity Assessment: RLE deficits/detail RLE Deficits / Details: decreased ROM and strength due to pain and precautions RLE: Unable to fully assess due to pain    Cervical / Trunk Assessment Cervical / Trunk Assessment: Kyphotic  Communication   Communication: No difficulties  Cognition Arousal/Alertness: Awake/alert Behavior During Therapy: Flat affect Overall Cognitive Status: Impaired/Different from baseline Area of Impairment: Memory;Following commands                     Memory: Decreased short-term memory;Decreased recall of precautions Following Commands: Follows one step commands inconsistently;Follows one step commands with increased time       General Comments: pt unable to recall WBAT despite correction during OT session      General Comments General comments (skin integrity, edema, etc.): SpO2 >93% on 2lnc;HR 121 with sitting EOB    Exercises     Assessment/Plan    PT Assessment Patient needs continued PT services  PT Problem List Decreased strength;Decreased range of motion;Decreased activity tolerance;Decreased balance;Decreased mobility;Decreased cognition;Decreased knowledge of use of DME;Pain;Decreased knowledge of precautions;Cardiopulmonary status limiting activity;Decreased safety awareness       PT Treatment Interventions DME instruction;Gait training;Stair training;Functional mobility training;Therapeutic activities;Therapeutic exercise;Patient/family education;Balance training    PT Goals (Current goals can be found in the Care Plan section)  Acute Rehab PT Goals Patient Stated Goal: return to home and independence PT Goal Formulation: With patient/family Time For Goal Achievement: 12/01/19 Potential to  Achieve Goals: Fair    Frequency Min 5X/week   Barriers to discharge Decreased caregiver support      Co-evaluation               AM-PAC PT "6 Clicks" Mobility  Outcome Measure Help needed turning from your back to your side while in a flat bed without using bedrails?: A Lot Help needed moving from lying on your back to sitting on the side of a flat bed without using bedrails?: A Lot Help needed moving to and from a bed to a chair (including a wheelchair)?: Total Help needed standing up from a chair using your arms (e.g., wheelchair or bedside chair)?: Total Help needed to walk in hospital room?: Total Help needed climbing 3-5 steps with a railing? : Total 6 Click Score: 8    End of Session Equipment Utilized During Treatment: Gait belt Activity Tolerance: Treatment limited secondary to medical complications (Comment)(orthostatic hypotension) Patient left: with call bell/phone within reach;with family/visitor present;in bed;with bed alarm set Nurse Communication: Mobility status;Precautions;Weight bearing status PT Visit Diagnosis: Difficulty in walking, not elsewhere classified (R26.2);Other abnormalities of gait and mobility (R26.89);Muscle weakness (generalized) (M62.81)    Time: [id number] PT Time Calculation (min) (ACUTE ONLY): 30 min   Charges:   PT Evaluation $PT Eval Moderate Complexity: 1 Mod          Lynna Zamorano P, PT Acute Rehabilitation Services Pager: [phone] Office: Adrian Navayah Sok 11/17/2019, 1:15 PM

## 2019-11-17 NOTE — Plan of Care (Signed)
  Problem: Education: Goal: Knowledge of General Education information will improve Description: Including pain rating scale, medication(s)/side effects and non-pharmacologic comfort measures Outcome: Progressing   Problem: Clinical Measurements: Goal: Respiratory complications will improve Outcome: Progressing Note: On 2L O2 today unable to wear to room air during the day   Problem: Nutrition: Goal: Adequate nutrition will be maintained Outcome: Progressing   Problem: Coping: Goal: Level of anxiety will decrease Outcome: Progressing   Problem: Elimination: Goal: Will not experience complications related to urinary retention Outcome: Progressing   Problem: Pain Managment: Goal: General experience of comfort will improve Outcome: Progressing Note: Treated right hip pain twice with PRN vicodin   Problem: Safety: Goal: Ability to remain free from injury will improve Outcome: Progressing

## 2019-11-17 NOTE — Progress Notes (Signed)
  PT Cancellation Note  Patient Details Name: Stephanie Cortez MRN: [id number] DOB: 31-May-1934   Cancelled Treatment:    Reason Eval/Treat Not Completed: Patient at procedure or test/unavailable(currently working with OT)   Sandy Salaam Prater 11/17/2019, 9:57 AM  Bayard Males, PT Acute Rehabilitation Services Pager: [phone] Office: [phone]

## 2019-11-17 NOTE — Plan of Care (Addendum)
Pt stable when arriving back on the floor from PACU. Pt complained of discomfort, but was able to be repositioned with pillows and encouraged to stay off her bottom to prevent skin breakdown. Pt doing well, pt able to wiggle toes and has full sensation in toes. Will continue to monitor.  Problem: Education: Goal: Knowledge of General Education information will improve Description: Including pain rating scale, medication(s)/side effects and non-pharmacologic comfort measures Outcome: Progressing   Problem: Activity: Goal: Risk for activity intolerance will decrease Outcome: Progressing   Problem: Pain Managment: Goal: General experience of comfort will improve Outcome: Progressing   Problem: Safety: Goal: Ability to remain free from injury will improve Outcome: Progressing   Problem: Skin Integrity: Goal: Risk for impaired skin integrity will decrease Outcome: Progressing

## 2019-11-17 NOTE — Progress Notes (Signed)
   Subjective: 1 Day Post-Op Procedure(s) (LRB): TOTAL HIP REVISION BIPOLAR TO CEMENTED BIPOLAR (Right) Patient reports pain as mild and moderate.    Objective: Vital signs in last 24 hours: Temp:  [97.7 F (36.5 C)-98.1 F (36.7 C)] 97.8 F (36.6 C) (05/18 0237) Pulse Rate:  [73-96] 89 (05/18 0237) Resp:  [15-33] 16 (05/18 0237) BP: (100-135)/(48-117) 127/89 (05/18 0237) SpO2:  [95 %-98 %] 98 % (05/18 0237) Weight:  [84.2 kg] 84.2 kg (05/17 1205)  Intake/Output from previous day: 05/17 0701 - 05/18 0700 In: 1977.5 [I.V.:1612.5; Blood:315] Out: 1250 [Urine:550; Blood:700] Intake/Output this shift: No intake/output data recorded.  Recent Labs    11/15/19 0718 11/16/19 0545 11/17/19 0631  HGB 11.5* 10.9* 11.8*   Recent Labs    11/16/19 0545 11/17/19 0631  WBC 7.4 8.3  RBC 3.63* 3.99  HCT 34.7* 37.1  PLT 382 354   Recent Labs    11/16/19 0545 11/17/19 0631  NA 133* 131*  K 4.6 4.9  CL 102 99  CO2 24 22  BUN 13 12  CREATININE 0.65 0.65  GLUCOSE 107* 153*  CALCIUM 8.9 8.8*   Recent Labs    11/16/19 0545 11/17/19 0631  INR 1.5* 1.3*    Neurologically intact DG Hip Port Unilat With Pelvis 1V Right  Result Date: 11/16/2019 CLINICAL DATA:  Post surgery. EXAM: DG HIP (WITH OR WITHOUT PELVIS) 1V PORT RIGHT COMPARISON:  Radiograph 11/11/2019 FINDINGS: Unipolar right hip arthroplasty remains in place. There is new cerclage wire fixation about the proximal femoral stem. Fracture fragment involving the lesser trochanter is again seen. The femoral stem is midline. Recent postsurgical change in the soft tissues includes air, edema, and skin staples. Remainder of the bony pelvis is unchanged. IMPRESSION: New cerclage wire fixation about the proximal femoral stem of right hip arthroplasty. Periprosthetic fracture with displacement of the lesser trochanter again seen. Electronically Signed   By: Keith Rake M.D.   On: 11/16/2019 21:39   DG HIP UNILAT WITH PELVIS 2-3  VIEWS RIGHT  Result Date: 11/16/2019 CLINICAL DATA:  Total hip revision EXAM: DG HIP (WITH OR WITHOUT PELVIS) 2-3V RIGHT COMPARISON:  11/11/2019 FINDINGS: Multiple intraoperative portable images are submitted demonstrating revision of the right hip replacement. No complicating feature. IMPRESSION: Right hip revision.  No visible complicating feature. Electronically Signed   By: Rolm Baptise M.D.   On: 11/16/2019 20:44    Assessment/Plan: 1 Day Post-Op Procedure(s) (LRB): TOTAL HIP REVISION BIPOLAR TO CEMENTED BIPOLAR (Right) Up with therapy.  PLAN IS TO WORK ON WALKING WITH WALKER AND GETTING OOB.   Stephanie Cortez 11/17/2019, 7:40 AM

## 2019-11-17 NOTE — Plan of Care (Signed)

## 2019-11-17 NOTE — Progress Notes (Signed)
Light headed with standing with therapy. Did not walk. Back supine felt fine. WBAT with cemented stem. Continue therapy.

## 2019-11-18 ENCOUNTER — Encounter: Payer: Self-pay | Admitting: *Deleted

## 2019-11-18 LAB — CBC
HCT: 32.8 % — ABNORMAL LOW (ref 36.0–46.0)
Hemoglobin: 10.5 g/dL — ABNORMAL LOW (ref 12.0–15.0)
MCH: 29.9 pg (ref 26.0–34.0)
MCHC: 32 g/dL (ref 30.0–36.0)
MCV: 93.4 fL (ref 80.0–100.0)
Platelets: 323 10*3/uL (ref 150–400)
RBC: 3.51 MIL/uL — ABNORMAL LOW (ref 3.87–5.11)
RDW: 14.3 % (ref 11.5–15.5)
WBC: 12.5 10*3/uL — ABNORMAL HIGH (ref 4.0–10.5)
nRBC: 0 % (ref 0.0–0.2)

## 2019-11-18 LAB — PROTIME-INR
INR: 1.5 — ABNORMAL HIGH (ref 0.8–1.2)
Prothrombin Time: 17.6 seconds — ABNORMAL HIGH (ref 11.4–15.2)

## 2019-11-18 MED ORDER — TRAMADOL HCL 50 MG PO TABS
50.0000 mg | ORAL_TABLET | Freq: Four times a day (QID) | ORAL | Status: DC | PRN
  Administered 2019-11-18 – 2019-11-20 (×6): 50 mg via ORAL
  Filled 2019-11-18 (×7): qty 1

## 2019-11-18 MED ORDER — WARFARIN SODIUM 2.5 MG PO TABS
2.5000 mg | ORAL_TABLET | Freq: Once | ORAL | Status: AC
  Administered 2019-11-18: 2.5 mg via ORAL
  Filled 2019-11-18: qty 1

## 2019-11-18 NOTE — Consult Note (Signed)
   [facility] Inpatient Consult   11/18/2019  Stephanie Cortez 1933/11/14 [id number]   [facility] ACO Patient:  Medicare ACO  Patient evaluated for community based chronic complex disease management services with [place] [facility] as a benefit of patient's [employer].   Chart review reveals patient is desiring for home instead of [place] level or care nor to return to inpatient rehabilitation level of care. Patient recently underwent a Total Hip Revision Bipolar to cemented Bipolar of the right hip per Dr. Rodell Perna notes 11/17/19.  Call received from inpatient Transition of Care [facility] regarding patient's benefits with [facility].   Explained that the patient qualifies for programs in [place] Management.  Patient's daughter confirms patient's primary care provider to be [name], MD an [place].  This provider's office is listed to the transition of care follow up.    Of note, [facility] Care Management services does not replace or interfere with any services that are arranged by inpatient case management or social work.  For additional questions or referrals please contact:    Natividad Brood, RN BSN [facility] Liaison  [phone] business mobile phone Toll free office [phone]  Fax number: [phone] [name]  [email or web address]

## 2019-11-18 NOTE — Progress Notes (Signed)
ANTICOAGULATION CONSULT NOTE - Follow Up Consult  Pharmacy Consult for warfarin Indication: atrial fibrillation, s/p R THA  Patient Measurements: Height: 5\' 6"  (167.6 cm) Weight: 84.2 kg (185 lb 10 oz) IBW/kg (Calculated) : 59.3  Vital Signs: Temp: 98.8 F (37.1 C) (05/19 0419) BP: 104/55 (05/19 0419) Pulse Rate: 74 (05/19 0419)  Labs: Recent Labs    11/15/19 1351 11/16/19 0545 11/16/19 0545 11/17/19 0631 11/18/19 0252  HGB  --  10.9*   < > 11.8* 10.5*  HCT  --  34.7*  --  37.1 32.8*  PLT  --  382  --  354 323  LABPROT  --  17.1*  --  15.8* 17.6*  INR  --  1.5*  --  1.3* 1.5*  HEPARINUNFRC 0.66 0.49  --   --   --   CREATININE  --  0.65  --  0.65  --    < > = values in this interval not displayed.    Estimated Creatinine Clearance: 57.3 mL/min (by C-G formula based on SCr of 0.65 mg/dL).  Assessment: 86 year old W on warfarin PTA for hx of atrial fibrillation (CHADS2VASc = 4). Pt admitted for fall d/t hip fracture, underwent right hip arthroplasty on 4/28. Pharmacy consulted 10/30/19 to resume warfarin. Patient planned for orthopedic revision surgery on 5/17 so warfarin was held starting 5/13 and pharmacy consulted to start heparin.   PTA warfarin dose = 1.25 mg on MWF, 2.5 mg all other days    Warfarin resumed 5/18 and INR remains subtherapeutic as expected at 1.5. No bleeding noted.   Goal of Therapy:  INR 2-3 Antiplatelet monitoring per protocol   Plan:  Warfarin 2.5mg  PO x 1 tonight Daily INR  Salome Arnt, PharmD, BCPS Clinical Pharmacist Please see AMION for all pharmacy numbers 11/18/2019 7:58 AM

## 2019-11-18 NOTE — NC FL2 (Signed)
[place] MEDICAID FL2 LEVEL OF CARE SCREENING TOOL     IDENTIFICATION  Patient Name: Stephanie Cortez Birthdate: 1934-04-11 Sex: female Admission Date (Current Location): 11/16/2019  [facility] and [place] Number:  Herbalist and Address:  [facility] [place] [address], [place], [place] [address]      Provider Number: [id number]  Attending Physician Name and Address:  Marybelle Killings, MD  Relative Name and Phone Number:       Current Level of Care: Hospital Recommended Level of Care: [place] Prior Approval Number:    Date Approved/Denied:   PASRR Number: [id number]  Discharge Plan: SNF    Current Diagnoses: Patient Active Problem List   Diagnosis Date Noted  . Periprosthetic fracture around internal prosthetic hip joint 11/16/2019  . Periprosthetic fracture around internal prosthetic hip joint, initial encounter   . Chronic obstructive pulmonary disease ([place])   . Essential hypertension   . Chronic anticoagulation   . Acute blood loss anemia   . Transaminitis   . Hypoalbuminemia due to protein-calorie malnutrition ([place])   . Labile blood pressure   . Drug induced constipation   . Postoperative pain   . Subcapital fracture of femur ([place]) 10/30/2019  . Post-operative state   . Prerenal azotemia   . Leukocytosis   . Closed right hip fracture, initial encounter ([place]) 10/27/2019  . Hoarseness 02/19/2018  . Sensorineural hearing loss (SNHL) of both ears 02/19/2018  . Tinnitus, bilateral 02/19/2018  . Cough 09/06/2017  . Cough variant asthma 08/19/2017  . Acute bronchitis 02/20/2016  . Pulmonary air trapping 01/04/2016  . Encounter for preoperative pulmonary examination 01/04/2016  . NSIP (nonspecific interstitial pneumonia) ([place]) 09/26/2015  . Chronic sinusitis 09/26/2015  . ILD (interstitial lung disease) ([place]) 07/12/2014  . Upper airway cough syndrome 09/23/2013  . Encounter for therapeutic drug monitoring 08/11/2013   . Congestive heart failure ([place]) 07/08/2013  . Coronary artery calcification seen on CAT scan 03/15/2013  . Nodule of left lung 03/15/2013  . Orthostatic lightheadedness 02/09/2013  . Situational mixed anxiety and depressive disorder   . Hyponatremia 04/03/2012  . Diverticulitis 01/01/2012  . GERD with stricture 01/01/2012  . Hypothyroid 11/23/2010  . Edema 10/04/2010  . Bronchiectasis ([place])   . HYPERTENSION, BENIGN 10/22/2008  . A-fib (H[facility])     Orientation RESPIRATION BLADDER Height & Weight     Self, Time, Situation  Normal Continent Weight: 84.2 kg Height:  5\' 6"  (167.6 cm)  BEHAVIORAL SYMPTOMS/MOOD NEUROLOGICAL BOWEL NUTRITION STATUS      Continent Diet(refer to d/c summary)  AMBULATORY STATUS COMMUNICATION OF NEEDS Skin   Extensive Assist Verbally Surgical wounds                       Personal Care Assistance Level of Assistance  Bathing, Feeding, Dressing Bathing Assistance: Maximum assistance Feeding assistance: Independent Dressing Assistance: Maximum assistance     Functional Limitations Info  Sight, Hearing, Speech Sight Info: Adequate Hearing Info: Adequate Speech Info: Adequate    SPECIAL CARE FACTORS FREQUENCY  PT (By licensed PT), OT (By licensed OT)     PT Frequency: 5w / week, evaluate and treat OT Frequency: 5w / week, evaluate and treat            Contractures Contractures Info: Not present    Additional Factors Info  Code Status, Allergies Code Status Info: Full Code Allergies Info: Doxycycline, Flagyl Metronidazole, Moxifloxacin, Pantoprazole Sodium, Penicillins  Current Medications (11/18/2019):  This is the current hospital active medication list Current Facility-Administered Medications  Medication Dose Route Frequency Provider Last Rate Last Admin  . 0.9 %  sodium chloride infusion (Manually program via Guardrails IV Fluids)   Intravenous Once Dongell, Tyrone Nine, CRNA      . acetaminophen (TYLENOL) tablet 325-650 mg   325-650 mg Oral Q6H PRN Lanae Crumbly, PA-C      . acetaminophen (TYLENOL) tablet 650 mg  650 mg Oral TID WC & HS Bary Leriche, PA-C   650 mg at 11/18/19 1309  . alum & mag hydroxide-simeth (MAALOX/MYLANTA) 200-200-20 MG/5ML suspension 30 mL  30 mL Oral Q4H PRN Bary Leriche, PA-C   30 mL at 11/18/19 1523  . bisacodyl (DULCOLAX) suppository 10 mg  10 mg Rectal Daily PRN Love, Pamela S, PA-C      . bisoprolol (ZEBETA) tablet 5 mg  5 mg Oral Daily Love, Ivan Anchors, PA-C   5 mg at 11/18/19 0845  . diltiazem (CARDIZEM CD) 24 hr capsule 180 mg  180 mg Oral QHS Bary Leriche, PA-C   180 mg at 11/17/19 2108  . diphenhydrAMINE (BENADRYL) 12.5 MG/5ML elixir 12.5-25 mg  12.5-25 mg Oral Q6H PRN Love, Pamela S, PA-C      . docusate sodium (COLACE) capsule 100 mg  100 mg Oral BID Lanae Crumbly, PA-C   100 mg at 11/18/19 0846  . feeding supplement (PRO-STAT SUGAR FREE 64) liquid 30 mL  30 mL Oral BID Bary Leriche, PA-C   30 mL at 11/18/19 0846  . FLUoxetine (PROZAC) capsule 10 mg  10 mg Oral QHS Bary Leriche, PA-C   10 mg at 11/17/19 2108  . fluticasone (FLONASE) 50 MCG/ACT nasal spray 1 spray  1 spray Each Nare BID PRN Love, Pamela S, PA-C      . furosemide (LASIX) tablet 20 mg  20 mg Oral Daily PRN Love, Pamela S, PA-C      . guaiFENesin-dextromethorphan (ROBITUSSIN DM) 100-10 MG/5ML syrup 5-10 mL  5-10 mL Oral Q6H PRN Love, Pamela S, PA-C      . ipratropium-albuterol (DUONEB) 0.5-2.5 (3) MG/3ML nebulizer solution 3 mL  3 mL Nebulization BID PRN Love, Pamela S, PA-C      . lactated ringers infusion   Intravenous Continuous Hulan Fray L, MD 10 mL/hr at 11/17/19 1247 Rate Verify at 11/17/19 1247  . levothyroxine (SYNTHROID) tablet 50 mcg  50 mcg Oral QAC breakfast Bary Leriche, PA-C   50 mcg at 11/18/19 [id number]  . menthol-cetylpyridinium (CEPACOL) lozenge 3 mg  1 lozenge Oral PRN Love, Pamela S, PA-C       Or  . phenol (CHLORASEPTIC) mouth spray 1 spray  1 spray Mouth/Throat PRN Love, Pamela S,  PA-C      . methocarbamol (ROBAXIN) tablet 250 mg  250 mg Oral QID Love, Pamela S, PA-C   250 mg at 11/18/19 1309  . metoCLOPramide (REGLAN) tablet 5-10 mg  5-10 mg Oral Q8H PRN Lanae Crumbly, PA-C       Or  . metoCLOPramide (REGLAN) injection 5-10 mg  5-10 mg Intravenous Q8H PRN Benjiman Core M, PA-C      . mometasone-formoterol (DULERA) 100-5 MCG/ACT inhaler 2 puff  2 puff Inhalation BID Bary Leriche, PA-C   2 puff at 11/18/19 0834  . ondansetron (ZOFRAN) tablet 4 mg  4 mg Oral Q6H PRN Bary Leriche, PA-C       Or  .  ondansetron (ZOFRAN) injection 4 mg  4 mg Intravenous Q6H PRN Love, Pamela S, PA-C      . ondansetron [facility]) tablet 4 mg  4 mg Oral Q6H PRN Lanae Crumbly, PA-C       Or  . ondansetron [facility]) injection 4 mg  4 mg Intravenous Q6H PRN Benjiman Core M, PA-C      . polyethylene glycol (MIRALAX / GLYCOLAX) packet 17 g  17 g Oral BID Bary Leriche, PA-C   17 g at 11/18/19 0846  . polyethylene glycol (MIRALAX / GLYCOLAX) packet 17 g  17 g Oral Daily PRN Lanae Crumbly, PA-C      . pravastatin (PRAVACHOL) tablet 40 mg  40 mg Oral QHS Bary Leriche, PA-C   40 mg at 11/17/19 2109  . prochlorperazine (COMPAZINE) tablet 5-10 mg  5-10 mg Oral Q6H PRN Love, Pamela S, PA-C       Or  . prochlorperazine (COMPAZINE) injection 5-10 mg  5-10 mg Intramuscular Q6H PRN Love, Pamela S, PA-C       Or  . prochlorperazine (COMPAZINE) suppository 12.5 mg  12.5 mg Rectal Q6H PRN Love, Pamela S, PA-C      . senna (SENOKOT) tablet 17.2 mg  2 tablet Oral QPC supper Reesa Chew S, PA-C   17.2 mg at 11/17/19 1712  . sodium phosphate (FLEET) 7-19 GM/118ML enema 1 enema  1 enema Rectal Once PRN Love, Pamela S, PA-C      . traZODone (DESYREL) tablet 25 mg  25 mg Oral QHS PRN Love, Pamela S, PA-C      . vitamin B-12 (CYANOCOBALAMIN) tablet 1,000 mcg  1,000 mcg Oral Once per day on Mon Wed Fri Love, Pamela S, PA-C   1,000 mcg at 11/18/19 1311  . warfarin (COUMADIN) tablet 2.5 mg  2.5 mg Oral ONCE-1600  Rumbarger, Valeda Malm, RPH      . Warfarin - Pharmacist Dosing Inpatient   Does not apply [id number] Jiles Crocker Valeda Malm [facility]   Given at 11/17/19 1551     Discharge Medications: Please see discharge summary for a list of discharge medications.  Relevant Imaging Results:  Relevant Lab Results:   Additional Information [id number]  Sharin Mons, RN

## 2019-11-18 NOTE — Plan of Care (Signed)

## 2019-11-18 NOTE — Progress Notes (Signed)
Occupational Therapy Treatment Patient Details Name: Stephanie Cortez MRN: [id number] DOB: 1933-07-24 Today's Date: 11/18/2019    History of present illness Patient is a 86 year old female readmitted with Rt periprosthetic fx s/p femoral stem revision 5/17. Pt with fall 4/27 with closed femoral neck fracture s/p R hemiarthoplasty on 4/28 who D/C'd to CIR. PMhx:HTN, AFib,GERD, anxiety, CHF, hearing loss   OT comments  Pt agreeable to OT/PT session this date, RN provided pain meds prior to OT/PT session, pt reporting 5/10 pain. Pt required maxA+2 to progress to EOB, she required modA+2 to stand, pt limited by orthostatic hypotension (see below). Pt required return to sitting, began dry heaving, no emesis. RN notified of nausea and BP. Pt required totalA+2 for return to supine. Pt left in chair position in bed. Pt will continue to benefit from skilled OT services to maximize safety and independence with ADL/IADL and functional mobility. Will continue to follow acutely and progress as tolerated.  Pt's son-in-law present during session, pt and family agreeable to SNF d/c.    SpO2 RA 86%-88%, pt required 2lnc to maintain SpO2 >93%. RN notified.   Orthostatic Blood Pressure: RN aware  Supine: 106/56 (70) Sitting: 95/46 (60) HR 80bpm Standing 1 min: 95/47 (61) HR 85bpm Return to supine: 145/49 (78)   Follow Up Recommendations  SNF    Equipment Recommendations  3 in 1 bedside commode    Recommendations for Other Services      Precautions / Restrictions Precautions Precautions: Posterior Hip;Fall Precaution Booklet Issued: No Precaution Comments: watch BP Required Braces or Orthoses: Knee Immobilizer - Right Knee Immobilizer - Right: Other (comment)(remove for therapy) Restrictions Weight Bearing Restrictions: Yes RLE Weight Bearing: Weight bearing as tolerated Other Position/Activity Restrictions: per verbal order from Dr. Lorin Mercy       Mobility Bed Mobility Overal bed mobility:  Needs Assistance Bed Mobility: Supine to Sit;Sit to Supine     Supine to sit: Max assist;+2 for physical assistance;+2 for safety/equipment Sit to supine: Total assist;+2 for physical assistance;+2 for safety/equipment;HOB elevated   General bed mobility comments: maxA+2 for BLE management and to elevate trunk;totalA+2 for return to supine  Transfers Overall transfer level: Needs assistance Equipment used: Rolling walker (2 wheeled) Transfers: Sit to/from Stand Sit to Stand: Mod assist;+2 physical assistance;+2 safety/equipment         General transfer comment: modA to progress into standing    Balance Overall balance assessment: Needs assistance Sitting-balance support: Single extremity supported;Feet supported Sitting balance-Leahy Scale: Fair Sitting balance - Comments: single UE support for pain management   Standing balance support: Bilateral upper extremity supported;During functional activity Standing balance-Leahy Scale: Poor Standing balance comment: reliant on external support                            ADL either performed or assessed with clinical judgement   ADL Overall ADL's : Needs assistance/impaired     Grooming: Set up;Sitting;Bed level               Lower Body Dressing: Maximal assistance Lower Body Dressing Details (indicate cue type and reason): maxA to access BLE;modA+2 to progress into standing Toilet Transfer: Moderate assistance;+2 for physical assistance;+2 for safety/equipment Toilet Transfer Details (indicate cue type and reason): sit<>stand, unable to pivot this session Toileting- Clothing Manipulation and Hygiene: Moderate assistance;+2 for physical assistance;+2 for safety/equipment;Sit to/from stand Toileting - Clothing Manipulation Details (indicate cue type and reason): stood from EOB, limited by [employer]  Functional mobility during ADLs: Moderate assistance;+2 for physical assistance;+2 for  safety/equipment;Rolling walker General ADL Comments: Pt limited due to orthostatic BP, sitting EOB pt with dry heaving to emesis this session, RN notified     Vision   Vision Assessment?: No apparent visual deficits   Perception     Praxis      Cognition Arousal/Alertness: Awake/alert Behavior During Therapy: Flat affect Overall Cognitive Status: Within Functional Limits for tasks assessed                                 General Comments: for basic mobility         Exercises     Shoulder Instructions       General Comments SpO2 <90% on RA, provided pt with 2lnc to maintain SpO2 >93% RN aware    Pertinent Vitals/ Pain       Pain Assessment: Faces Faces Pain Scale: Hurts little more Pain Location: R hip/groin with mobility Pain Descriptors / Indicators: Guarding;Aching Pain Intervention(s): Limited activity within patient's tolerance;Monitored during session;Repositioned  Home Living                                          Prior Functioning/Environment              Frequency  Min 2X/week        Progress Toward Goals  OT Goals(current goals can now be found in the care plan section)  Progress towards OT goals: Not progressing toward goals - comment(limited by nausea and BP)  Acute Rehab OT Goals Patient Stated Goal: return to home and independence OT Goal Formulation: With patient Time For Goal Achievement: 12/01/19 Potential to Achieve Goals: Good ADL Goals Pt Will Perform Grooming: standing;with min guard assist Pt Will Perform Lower Body Dressing: with adaptive equipment;sit to/from stand;with min guard assist Pt Will Transfer to Toilet: with min guard assist;bedside commode Pt Will Perform Toileting - Clothing Manipulation and hygiene: with min guard assist;sit to/from stand Additional ADL Goal #1: Pt will progress to EOB with minguard in preparation for ADL.  Plan Discharge plan remains appropriate     Co-evaluation    PT/OT/SLP Co-Evaluation/Treatment: Yes Reason for Co-Treatment: For patient/therapist safety;To address functional/ADL transfers   OT goals addressed during session: ADL's and self-care      AM-PAC OT "6 Clicks" Daily Activity     Outcome Measure   Help from another person eating meals?: A Little Help from another person taking care of personal grooming?: A Little Help from another person toileting, which includes using toliet, bedpan, or urinal?: A Lot Help from another person bathing (including washing, rinsing, drying)?: A Lot Help from another person to put on and taking off regular upper body clothing?: A Little Help from another person to put on and taking off regular lower body clothing?: A Lot 6 Click Score: 15    End of Session Equipment Utilized During Treatment: Rolling walker;Oxygen;Gait belt  OT Visit Diagnosis: Other abnormalities of gait and mobility (R26.89);Unsteadiness on feet (R26.81);Muscle weakness (generalized) (M62.81);History of falling (Z91.81);Pain Pain - Right/Left: Right Pain - part of body: Hip   Activity Tolerance Patient tolerated treatment well;Patient limited by pain;Other (comment)(limited by orthostatic hypotension)   Patient Left with call bell/phone within reach;with family/visitor present;in bed;with bed alarm set   Nurse Communication Mobility status(vitals, nauseous)  [id number] OT Time Calculation (min): 39 min  Charges: OT General Charges $OT Visit: 1 Visit OT Treatments $Self Care/Home Management : 23-37 mins  Helene Kelp OTR/L Acute Rehabilitation Services Office: [address] 11/18/2019, 9:58 AM

## 2019-11-18 NOTE — Evaluation (Addendum)
Physical Therapy Treatment Patient Details Name: Stephanie Cortez MRN: [id number] DOB: Nov 13, 1933 Today's Date: 11/18/2019   History of Present Illness  Patient is a 86 year old female readmitted with Rt periprosthetic fx s/p femoral stem revision 5/17. Pt with fall 4/27 with closed femoral neck fracture s/p R hemiarthoplasty on 4/28 who D/C'd to CIR. PMhx:HTN, AFib,GERD, anxiety, CHF, hearing loss  Clinical Impression  Pt remains limited by orthostatic hypotension, nausea, and decreased activity tolerance. Requiring two person max-total assist for bed mobility. Able to stand from edge of bed up to a walker with moderate assist but unable to take steps. Desaturation to 80% on RA, so placed on 1.5 L with extended time required to return > 90%. Education provided on IS use. Continues with gross weakness, decreased endurance, and balance impairments. Continue to recommend SNF for ongoing Physical Therapy.    Orthostatic Vitals: Supine: 128/39 (64) Sitting: 106/56 (70) Standing: 95/46 (60) Returned to supine: 145/49 (78)    Follow Up Recommendations SNF;Supervision/Assistance - 24 hour    Equipment Recommendations  Rolling walker with 5" wheels;3in1 (PT);Wheelchair (measurements PT)    Recommendations for Other Services       Precautions / Restrictions Precautions Precautions: Posterior Hip;Fall Precaution Booklet Issued: No Precaution Comments: watch BP Required Braces or Orthoses: Knee Immobilizer - Right Knee Immobilizer - Right: Other (comment)(remove for therapy) Restrictions Weight Bearing Restrictions: Yes RLE Weight Bearing: Weight bearing as tolerated Other Position/Activity Restrictions: per verbal order from Dr. Lorin Mercy      Mobility  Bed Mobility Overal bed mobility: Needs Assistance Bed Mobility: Supine to Sit;Sit to Supine     Supine to sit: Max assist;+2 for physical assistance;+2 for safety/equipment Sit to supine: Total assist;+2 for physical assistance;+2 for  safety/equipment;HOB elevated   General bed mobility comments: maxA+2 for BLE management and to elevate trunk;totalA+2 for return to supine  Transfers Overall transfer level: Needs assistance Equipment used: Rolling walker (2 wheeled) Transfers: Sit to/from Stand Sit to Stand: Mod assist;+2 physical assistance;+2 safety/equipment         General transfer comment: modA to progress into standing. cues for hand/foot placement.  Ambulation/Gait                Stairs            Wheelchair Mobility    Modified Rankin (Stroke Patients Only)       Balance Overall balance assessment: Needs assistance Sitting-balance support: Single extremity supported;Feet supported Sitting balance-Leahy Scale: Fair Sitting balance - Comments: single UE support for pain management   Standing balance support: Bilateral upper extremity supported;During functional activity Standing balance-Leahy Scale: Poor Standing balance comment: reliant on external support                              Pertinent Vitals/Pain Pain Assessment: Faces Faces Pain Scale: Hurts little more Pain Location: R hip/groin with mobility Pain Descriptors / Indicators: Guarding;Aching Pain Intervention(s): Monitored during session;Limited activity within patient's tolerance;Premedicated before session;Repositioned    Home Living                        Prior Function                 Hand Dominance        Extremity/Trunk Assessment   Upper Extremity Assessment Upper Extremity Assessment: Generalized weakness    Lower Extremity Assessment Lower Extremity Assessment: Defer to PT evaluation RLE  Deficits / Details: decreased ROM and strength due to pain and precautions RLE: Unable to fully assess due to pain       Communication      Cognition Arousal/Alertness: Awake/alert Behavior During Therapy: Flat affect Overall Cognitive Status: Within Functional Limits for tasks  assessed                                 General Comments: for basic mobility       General Comments General comments (skin integrity, edema, etc.): SpO2 <90% on RA, provided pt with 2lnc to maintain SpO2 >93% RN aware    Exercises Total Joint Exercises Quad Sets: Right;5 reps;Supine   Assessment/Plan    PT Assessment    PT Problem List         PT Treatment Interventions      PT Goals (Current goals can be found in the Care Plan section)  Acute Rehab PT Goals Patient Stated Goal: return to home and independence PT Goal Formulation: With patient/family Time For Goal Achievement: 12/01/19 Potential to Achieve Goals: Fair    Frequency Min 5X/week   Barriers to discharge        Co-evaluation PT/OT/SLP Co-Evaluation/Treatment: Yes Reason for Co-Treatment: For patient/therapist safety;To address functional/ADL transfers PT goals addressed during session: Mobility/safety with mobility OT goals addressed during session: ADL's and self-care       AM-PAC PT "6 Clicks" Mobility  Outcome Measure Help needed turning from your back to your side while in a flat bed without using bedrails?: A Lot Help needed moving from lying on your back to sitting on the side of a flat bed without using bedrails?: Total Help needed moving to and from a bed to a chair (including a wheelchair)?: A Lot Help needed standing up from a chair using your arms (e.g., wheelchair or bedside chair)?: A Lot Help needed to walk in hospital room?: Total Help needed climbing 3-5 steps with a railing? : Total 6 Click Score: 9    End of Session Equipment Utilized During Treatment: Gait belt Activity Tolerance: Treatment limited secondary to medical complications (Comment)(orthostatic hypotension) Patient left: with call bell/phone within reach;with family/visitor present;in bed;with bed alarm set Nurse Communication: Mobility status;Other (comment)(vitals) PT Visit Diagnosis: Difficulty in  walking, not elsewhere classified (R26.2);Other abnormalities of gait and mobility (R26.89);Muscle weakness (generalized) (M62.81)    Time: [id number] PT Time Calculation (min) (ACUTE ONLY): 39 min   Charges:    $Therapeutic Activity: 8-22 mins                 Wyona Almas, PT, DPT Acute Rehabilitation Services Pager [phone] Office [phone]   Deno Etienne 11/18/2019, 10:09 AM

## 2019-11-18 NOTE — Progress Notes (Signed)
Subjective: Doing well.  Pain controlled.  Hoping to d/c home but has not ambulated to far out of bed.  Per RN patient getting dizzy and lightheaded when standing.     Objective: Vital signs in last 24 hours: Temp:  [97.7 F (36.5 C)-98.8 F (37.1 C)] 97.7 F (36.5 C) (05/19 0728) Pulse Rate:  [66-92] 66 (05/19 0728) Resp:  [14-17] 17 (05/19 0728) BP: (104-124)/(43-55) 124/50 (05/19 0728) SpO2:  [92 %-98 %] 98 % (05/19 0728)  Intake/Output from previous day: 05/18 0701 - 05/19 0700 In: 557.7 [P.O.:480; I.V.:77.7] Out: -  Intake/Output this shift: Total I/O In: 240 [P.O.:240] Out: -   Recent Labs    11/16/19 0545 11/17/19 0631 11/18/19 0252  HGB 10.9* 11.8* 10.5*   Recent Labs    11/17/19 0631 11/18/19 0252  WBC 8.3 12.5*  RBC 3.99 3.51*  HCT 37.1 32.8*  PLT 354 323   Recent Labs    11/16/19 0545 11/17/19 0631  NA 133* 131*  K 4.6 4.9  CL 102 99  CO2 24 22  BUN 13 12  CREATININE 0.65 0.65  GLUCOSE 107* 153*  CALCIUM 8.9 8.8*   Recent Labs    11/17/19 0631 11/18/19 0252  INR 1.3* 1.5*    Exam: Pleasant elderly female, alert and oriented, NAD.  Hip wound looks good.  Nvi.     Assessment/Plan: Patient and her daughter would like a discharge to home with 24-hour care there.  Patient advised me that her daughter will not be offering much assistance but will move into her home while she recovers.  I advised patient that with the multiple medical issues that she has, her unsteadiness, and recommendations from physical therapist here in the hospital I think it would probably be best if she did go to a skilled nursing facility.  Patient and daughter will discuss that further with social worker this afternoon.  Continue present care.  May need to get a medicine consult while patient is in the hospital.   Benjiman Core 11/18/2019, 12:52 PM

## 2019-11-18 NOTE — TOC Initial Note (Addendum)
Transition of Care [facility]) - Initial/Assessment Note    Patient Details  Name: Stephanie Cortez MRN: [id number] Date of Birth: 30-May-1934  Transition of [facility]) CM/SW Contact:    Sharin Mons, RN Phone Number: [phone] 11/18/2019, 10:08 AM  Clinical Narrative:        Pt readmitted with periprosthetic fx, s/p fermoral stem revision 7/17/202. Hx of closed femoral neck fracture s/p R hemiarthoplasty on 4/28( D/C'd to CIR). Hx:HTN, AFib,GERD, anxiety, CHF, hearing loss      NCM spoke with pt and daughter @ bedside on yesterday regarding POC needs. NCM shared PT/OT's recommendation : SNF;Supervision/Assistance - 24 hour vs HH services pending progress.  Pt's preference,  home with home services. NCM shared [facility] HOME 1st program vs SNF. Pt very interested in Home 1st program.  Referral made with Alvis Lemmings / Tommi Rumps ... awaiting approval. States daughter will provide supervision needed.  TOC following for needs.....    11/18/2019@ 1345  NCM received call from pt/pt's daughter and son in law giving  NCM the ok to do SNF work up as potential SNF placement vs HH. Still preference @ d/c home with home health services.  Patient without  preference for SNF   . NCM discussed insurance authorization process and provided Medicare SNF ratings list. No further questions reported at this time.NCM to continue to follow and assist with discharge planning needs.   Expected Discharge Plan: [place] Services(Home 1st  program/ [facility] vs SNF) Barriers to Discharge: Continued Medical Work up   Patient Goals and CMS Choice   CMS Medicare.gov Compare Post Acute Care list provided to:: Other (Comment Required) Choice offered to / list presented to : Patient  Expected Discharge Plan and Services Expected Discharge Plan: [place] [facility](Home 1st  program/ [facility] vs SNF)   Discharge Planning Services: CM Consult Post Acute Care Choice: Home Health   Expected Discharge Date:  11/16/19                         HH Arranged: PT, OT, Social Work [employer] Agency: [facility] [employer] Date [place]: 11/17/19 Time [place]: 1 Representative spoke with at [place]: [place] Arrangements/Services   Lives with:: Self Patient language and need for interpreter reviewed:: No Do you feel safe going back to the place where you live?: Yes      Need for Family Participation in Patient Care: Yes (Comment) Care giver support system in place?: Yes (comment)   Criminal Activity/Legal Involvement Pertinent to Current Situation/Hospitalization: No - Comment as needed  Activities of Daily Living Home Assistive Devices/Equipment: Eyeglasses ADL Screening (condition at time of admission) Patient's cognitive ability adequate to safely complete daily activities?: Yes Is the patient deaf or have difficulty hearing?: No Does the patient have difficulty seeing, even when wearing glasses/contacts?: No Does the patient have difficulty concentrating, remembering, or making decisions?: No Patient able to express need for assistance with ADLs?: Yes Does the patient have difficulty dressing or bathing?: No Independently performs ADLs?: Yes (appropriate for developmental age) Communication: Independent Dressing (OT): Needs assistance Is this a change from baseline?: Change from baseline, expected to last >3 days Grooming: Needs assistance Is this a change from baseline?: Change from baseline, expected to last >3 days Feeding: Independent Is this a change from baseline?: Pre-admission baseline Bathing: Needs assistance Is this a change from baseline?: Change from baseline, expected to last >3  days Toileting: Needs assistance Is this a change from baseline?: Change from baseline, expected to last >3days In/Out Bed: Needs assistance Is this a change from baseline?: Change from baseline, expected to last >3 days Walks in Home:  Needs assistance Is this a change from baseline?: Change from baseline, expected to last >3 days Does the patient have difficulty walking or climbing stairs?: Yes Weakness of Legs: Right Weakness of Arms/Hands: None  Permission Sought/Granted Permission sought to share information with : Case Manager Permission granted to share information with : Yes, Verbal Permission Granted  Share Information with NAME: Winfield Cunas ( daughter)  Permission granted to share info w AGENCY: SNF, HH  Permission granted to share info w Relationship: daugter     Emotional Assessment Appearance:: Appears stated age     Orientation: : Oriented to Self, Oriented to Place, Oriented to  Time, Oriented to Situation Alcohol / Substance Use: Not Applicable Psych Involvement: No (comment)  Admission diagnosis:  Periprosthetic fracture around internal prosthetic hip joint [id number], [place] Patient Active Problem List   Diagnosis Date Noted  . Periprosthetic fracture around internal prosthetic hip joint 11/16/2019  . Periprosthetic fracture around internal prosthetic hip joint, initial encounter   . Chronic obstructive pulmonary disease ([place])   . Essential hypertension   . Chronic anticoagulation   . Acute blood loss anemia   . Transaminitis   . Hypoalbuminemia due to protein-calorie malnutrition ([place])   . Labile blood pressure   . Drug induced constipation   . Postoperative pain   . Subcapital fracture of femur ([place]) 10/30/2019  . Post-operative state   . Prerenal azotemia   . Leukocytosis   . Closed right hip fracture, initial encounter ([place]) 10/27/2019  . Hoarseness 02/19/2018  . Sensorineural hearing loss (SNHL) of both ears 02/19/2018  . Tinnitus, bilateral 02/19/2018  . Cough 09/06/2017  . Cough variant asthma 08/19/2017  . Acute bronchitis 02/20/2016  . Pulmonary air trapping 01/04/2016  . Encounter for preoperative pulmonary examination 01/04/2016  . NSIP (nonspecific interstitial  pneumonia) ([place]) 09/26/2015  . Chronic sinusitis 09/26/2015  . ILD (interstitial lung disease) ([place]) 07/12/2014  . Upper airway cough syndrome 09/23/2013  . Encounter for therapeutic drug monitoring 08/11/2013  . Congestive heart failure ([place]) 07/08/2013  . Coronary artery calcification seen on CAT scan 03/15/2013  . Nodule of left lung 03/15/2013  . Orthostatic lightheadedness 02/09/2013  . Situational mixed anxiety and depressive disorder   . Hyponatremia 04/03/2012  . Diverticulitis 01/01/2012  . GERD with stricture 01/01/2012  . Hypothyroid 11/23/2010  . Edema 10/04/2010  . Bronchiectasis ([place])   . HYPERTENSION, BENIGN 10/22/2008  . A-fib [facility])    PCP:  [name], MD Pharmacy:   [place], [place] [address] Phone: [phone] Fax: [phone]     Social Determinants of Health (SDOH) Interventions    Readmission Risk Interventions No flowsheet data found.

## 2019-11-18 NOTE — Progress Notes (Signed)
Inpatient Rehabilitation-Admissions Coordinator   [facility] has been following pt since her readmit back to acute for surgery (pt was in CIR from 4/30-5/17). Note pt does not want to return to CIR at this time and pt/family prefer home if appropriate. PT/OT rec's appear to be for SNF. AC will no longer follow for possible readmission back to CIR. Please re-consult if pt/family change their mind.   Raechel Ache, OTR/L  Rehab Admissions Coordinator  [phone] 11/18/2019 10:31 AM

## 2019-11-18 NOTE — Progress Notes (Signed)
MD made aware of orthostatics with PT. RN encouraged pt to drink fluids and work on IS. Son in Sports coach in room.

## 2019-11-19 LAB — PROTIME-INR
INR: 1.6 — ABNORMAL HIGH (ref 0.8–1.2)
Prothrombin Time: 18.7 seconds — ABNORMAL HIGH (ref 11.4–15.2)

## 2019-11-19 MED ORDER — WARFARIN SODIUM 2.5 MG PO TABS
2.5000 mg | ORAL_TABLET | Freq: Once | ORAL | Status: AC
  Administered 2019-11-19: 2.5 mg via ORAL
  Filled 2019-11-19: qty 1

## 2019-11-19 NOTE — Plan of Care (Signed)

## 2019-11-19 NOTE — Progress Notes (Signed)
   Subjective: 3 Days Post-Op Procedure(s) (LRB): TOTAL HIP REVISION BIPOLAR TO CEMENTED BIPOLAR (Right) Patient reports pain as mild.    Objective: Vital signs in last 24 hours: Temp:  [97.2 F (36.2 C)-98.2 F (36.8 C)] 98.2 F (36.8 C) (05/20 0727) Pulse Rate:  [65-77] 65 (05/20 0727) Resp:  [16-18] 17 (05/20 0727) BP: (112-125)/(51-69) 125/69 (05/20 0727) SpO2:  [95 %-99 %] 95 % (05/20 0727)  Intake/Output from previous day: 05/19 0701 - 05/20 0700 In: 480 [P.O.:480] Out: -  Intake/Output this shift: No intake/output data recorded.  Recent Labs    11/17/19 0631 11/18/19 0252  HGB 11.8* 10.5*   Recent Labs    11/17/19 0631 11/18/19 0252  WBC 8.3 12.5*  RBC 3.99 3.51*  HCT 37.1 32.8*  PLT 354 323   Recent Labs    11/17/19 0631  NA 131*  K 4.9  CL 99  CO2 22  BUN 12  CREATININE 0.65  GLUCOSE 153*  CALCIUM 8.8*   Recent Labs    11/18/19 0252 11/19/19 0252  INR 1.5* 1.6*    calf soft.   knee immobilizer removed.  No results found.  Assessment/Plan: 3 Days Post-Op Procedure(s) (LRB): TOTAL HIP REVISION BIPOLAR TO CEMENTED BIPOLAR (Right) Up with therapy.  She has gotten lightheaded trying to stand for the last 2 days . Hgb is fine . Eating better. norco stopped, tramadol ordered which previously did fine and post op pain has decreased. Continue with PT standing , to go home with HHPT she will have to pivot to bedside commode . Daughter looking in to getting assistance at home. Pt has not had vaccine and does not want to go to SNF.   Marybelle Killings 11/19/2019, 8:34 AM

## 2019-11-19 NOTE — Progress Notes (Signed)
ANTICOAGULATION CONSULT NOTE - Follow Up Consult  Pharmacy Consult for warfarin Indication: atrial fibrillation, s/p R THA  Patient Measurements: Height: 5\' 6"  (167.6 cm) Weight: 84.2 kg (185 lb 10 oz) IBW/kg (Calculated) : 59.3  Vital Signs: Temp: 98.2 F (36.8 C) (05/20 0727) Temp Source: Oral (05/20 0727) BP: 125/69 (05/20 0727) Pulse Rate: 65 (05/20 0727)  Labs: Recent Labs    11/17/19 0631 11/18/19 0252 11/19/19 0252  HGB 11.8* 10.5*  --   HCT 37.1 32.8*  --   PLT 354 323  --   LABPROT 15.8* 17.6* 18.7*  INR 1.3* 1.5* 1.6*  CREATININE 0.65  --   --     Estimated Creatinine Clearance: 57.3 mL/min (by C-G formula based on SCr of 0.65 mg/dL).  Assessment: 86 year old W on warfarin PTA for hx of atrial fibrillation (CHADS2VASc = 4). Pt admitted for fall d/t hip fracture, underwent right hip arthroplasty on 4/28. Pharmacy consulted 10/30/19 to resume warfarin. Patient planned for orthopedic revision surgery on 5/17 so warfarin was held starting 5/13 and pharmacy consulted to start heparin.   PTA warfarin dose = 1.25 mg on MWF, 2.5 mg all other days    Warfarin resumed 5/18 and INR remains subtherapeutic as expected at 1.6.   Goal of Therapy:  INR 2-3 Antiplatelet monitoring per protocol   Plan:  Warfarin 2.5mg  PO x 1 tonight Daily INR  Salome Arnt, PharmD, BCPS Clinical Pharmacist Please see AMION for all pharmacy numbers 11/19/2019 8:09 AM

## 2019-11-19 NOTE — Plan of Care (Signed)

## 2019-11-19 NOTE — TOC Progression Note (Addendum)
Transition of Care [facility]) - Progression Note    Patient Details  Name: Stephanie Cortez MRN: [id number] Date of Birth: 1934-02-27  [facility]) CM/SW Contact  Sharin Mons, RN Phone Number: [phone] 11/19/2019, 4:03 PM  Clinical Narrative:    Pt's preference remains to go home with home health services, however, open to SNF placement. NCM received call from admissions liaison @ [facility]. Liaison informed NCM they have accepted pt in the HUB and extended bed offer. NCM made pt aware and left voice message with pt's daughter. Pt to discuss with daughter.   Pt will need updated COVID for SNF  placement. Pt hasn't been vaccinated.  TOC team will continue to monitor.....  11/20/2019 0945 MD informed NCM pt and daughter has agreed to transition to [facility] SNF. NCM confirmed with pt , pt accepted and  admission liaison @ [facility] made aware.  Expected Discharge Plan: [place] [facility](vs SNF) Barriers to Discharge: Continued Medical Work up  Expected Discharge Plan and Services Expected Discharge Plan: [place] [facility](vs SNF)   Discharge Planning Services: CM Consult Post Acute Care Choice: Home Health   Expected Discharge Date: 11/16/19                         HH Arranged: PT, OT, Social Work [employer] Agency: [facility] [employer] Date [place]: 11/17/19 Time [place]: [id number] Representative spoke with at [place]: [place] ([place]) Interventions    Readmission Risk Interventions No flowsheet data found.

## 2019-11-19 NOTE — Progress Notes (Signed)
Physical Therapy Treatment Patient Details Name: Stephanie Cortez MRN: [id number] DOB: 05-10-1934 Today's Date: 11/19/2019    History of Present Illness Patient is a 86 year old female readmitted with Rt periprosthetic fx s/p femoral stem revision 5/17. Pt with fall 4/27 with closed femoral neck fracture s/p R hemiarthoplasty on 4/28 who D/C'd to CIR. PMhx:HTN, AFib,GERD, anxiety, CHF, hearing loss    PT Comments    Pt able to make more progress this session.  She is requiring mod-max assistance to mobilize.  Based on her presentation, continue to recommend SNF placement at d/c.  Daughter at bed side and very supportive.  Her daughter is working on making the best decision for placement for her mother but has not voiced a definitive choice yet. Will continue to follow patient 5x week to maximize functional gains to prepare for next venue of care.     Follow Up Recommendations  SNF;Supervision/Assistance - 24 hour     Equipment Recommendations  Rolling walker with 5" wheels;3in1 (PT);Wheelchair (measurements PT)    Recommendations for Other Services       Precautions / Restrictions Precautions Precautions: Posterior Hip;Fall Precaution Booklet Issued: No Precaution Comments: watch BP Required Braces or Orthoses: Knee Immobilizer - Right Knee Immobilizer - Right: Other (comment)(remove for therapy) Restrictions Weight Bearing Restrictions: Yes RLE Weight Bearing: Weight bearing as tolerated Other Position/Activity Restrictions: per verbal order from Dr. Lorin Mercy    Mobility  Bed Mobility Overal bed mobility: Needs Assistance Bed Mobility: Supine to Sit;Sit to Supine     Supine to sit: Mod assist     General bed mobility comments: Pt required assistance to move B LEs to edge of bed with use of bed pad.  Once feet dangling over side. She was able to use PTA as a railing to pull her trunk into a seated position.  Pt denies dizziness in sitting this session.  Transfers Overall  transfer level: Needs assistance Equipment used: Rolling walker (2 wheeled) Transfers: Sit to/from Stand Sit to Stand: Mod assist         General transfer comment: Cues for hand placement and HOB elevated this session to improve ease.  Pt with posterior bias this session;  Required increased assistance to lower to chair.  Poor ability to eccentrically load.  Ambulation/Gait Ambulation/Gait assistance: Mod assist;Max assist;+2 safety/equipment Gait Distance (Feet): 6 Feet Assistive device: Rolling walker (2 wheeled) Gait Pattern/deviations: Step-to pattern;Decreased stride length;Decreased stance time - right;Decreased weight shift to right Gait velocity: decreased   General Gait Details: Cues for upper trunk control, RW positioning and sequencing.   Pt fatigues very quickly this session and noted to flexed forward more when fatigued.  She does reports mild dizziness this session after gt that resolved when reclined in chair.   Stairs             Wheelchair Mobility    Modified Rankin (Stroke Patients Only)       Balance Overall balance assessment: Needs assistance Sitting-balance support: Single extremity supported;Feet supported Sitting balance-Leahy Scale: Fair Sitting balance - Comments: single UE support for pain management     Standing balance-Leahy Scale: Poor Standing balance comment: reliant on external support                             Cognition Arousal/Alertness: Awake/alert Behavior During Therapy: WFL for tasks assessed/performed;Anxious Overall Cognitive Status: Within Functional Limits for tasks assessed Area of Impairment: Memory;Following commands  Memory: Decreased short-term memory;Decreased recall of precautions Following Commands: Follows one step commands inconsistently;Follows one step commands with increased time       General Comments: for basic mobility       Exercises General Exercises -  Lower Extremity Ankle Circles/Pumps: AROM;Both;20 reps;Supine Quad Sets: AROM;Right;10 reps;Supine Short Arc Quad: AAROM;Right;10 reps;Supine Heel Slides: AAROM;Right;10 reps;Supine Hip ABduction/ADduction: AAROM;Right;10 reps;Supine    General Comments        Pertinent Vitals/Pain Pain Assessment: Faces Pain Score: 8  Pain Location: R hip/groin with mobility Pain Descriptors / Indicators: Guarding;Aching Pain Intervention(s): Monitored during session;Repositioned;Ice applied;RN gave pain meds during session;Patient requesting pain meds-RN notified    Home Living                      Prior Function            PT Goals (current goals can now be found in the care plan section) Acute Rehab PT Goals Patient Stated Goal: To figure out the best place to go for discharge. Potential to Achieve Goals: Fair Progress towards PT goals: Progressing toward goals    Frequency    Min 5X/week      PT Plan Current plan remains appropriate    Co-evaluation              AM-PAC PT "6 Clicks" Mobility   Outcome Measure  Help needed turning from your back to your side while in a flat bed without using bedrails?: A Lot Help needed moving from lying on your back to sitting on the side of a flat bed without using bedrails?: A Lot Help needed moving to and from a bed to a chair (including a wheelchair)?: A Lot   Help needed to walk in hospital room?: Total Help needed climbing 3-5 steps with a railing? : Total 6 Click Score: 8    End of Session Equipment Utilized During Treatment: Gait belt Activity Tolerance: Patient limited by fatigue Patient left: in chair;with call bell/phone within reach Nurse Communication: Mobility status(Pt is not ready to d/c home.) PT Visit Diagnosis: Difficulty in walking, not elsewhere classified (R26.2);Other abnormalities of gait and mobility (R26.89);Muscle weakness (generalized) (M62.81)     Time: [id number] PT Time Calculation (min)  (ACUTE ONLY): 31 min  Charges:  $Gait Training: 8-22 mins $Therapeutic Exercise: 8-22 mins                     Erasmo Leventhal , PTA Acute Rehabilitation Services Pager [phone] Office [phone]     Maika Kaczmarek Eli Hose 11/19/2019, 12:46 PM

## 2019-11-19 NOTE — Care Management Important Message (Signed)
Important Message  Patient Details  Name: Stephanie Cortez MRN: [id number] Date of Birth: 1934-06-06   Medicare Important Message Given:  Yes     Iris Bratton 11/19/2019, 1:57 PM

## 2019-11-19 NOTE — Progress Notes (Signed)
Patient surgical dressing changed because it was socked with blood, complained of pains as we assist her with toileting several times this shift, gave her pain medicine and put ice on it, will continue to monitor.

## 2019-11-20 DIAGNOSIS — M9701XD Periprosthetic fracture around internal prosthetic right hip joint, subsequent encounter: Secondary | ICD-10-CM | POA: Diagnosis not present

## 2019-11-20 DIAGNOSIS — K219 Gastro-esophageal reflux disease without esophagitis: Secondary | ICD-10-CM | POA: Diagnosis not present

## 2019-11-20 DIAGNOSIS — Z96641 Presence of right artificial hip joint: Secondary | ICD-10-CM | POA: Diagnosis not present

## 2019-11-20 DIAGNOSIS — J449 Chronic obstructive pulmonary disease, unspecified: Secondary | ICD-10-CM | POA: Diagnosis not present

## 2019-11-20 DIAGNOSIS — R278 Other lack of coordination: Secondary | ICD-10-CM | POA: Diagnosis not present

## 2019-11-20 DIAGNOSIS — K220 Achalasia of cardia: Secondary | ICD-10-CM | POA: Diagnosis not present

## 2019-11-20 DIAGNOSIS — I509 Heart failure, unspecified: Secondary | ICD-10-CM | POA: Diagnosis not present

## 2019-11-20 DIAGNOSIS — H903 Sensorineural hearing loss, bilateral: Secondary | ICD-10-CM | POA: Diagnosis not present

## 2019-11-20 DIAGNOSIS — K5792 Diverticulitis of intestine, part unspecified, without perforation or abscess without bleeding: Secondary | ICD-10-CM | POA: Diagnosis not present

## 2019-11-20 DIAGNOSIS — I959 Hypotension, unspecified: Secondary | ICD-10-CM | POA: Diagnosis not present

## 2019-11-20 DIAGNOSIS — Z9181 History of falling: Secondary | ICD-10-CM | POA: Diagnosis not present

## 2019-11-20 DIAGNOSIS — E039 Hypothyroidism, unspecified: Secondary | ICD-10-CM | POA: Diagnosis not present

## 2019-11-20 DIAGNOSIS — M9701XA Periprosthetic fracture around internal prosthetic right hip joint, initial encounter: Secondary | ICD-10-CM | POA: Diagnosis not present

## 2019-11-20 DIAGNOSIS — E46 Unspecified protein-calorie malnutrition: Secondary | ICD-10-CM | POA: Diagnosis not present

## 2019-11-20 DIAGNOSIS — R2681 Unsteadiness on feet: Secondary | ICD-10-CM | POA: Diagnosis not present

## 2019-11-20 DIAGNOSIS — M6281 Muscle weakness (generalized): Secondary | ICD-10-CM | POA: Diagnosis not present

## 2019-11-20 DIAGNOSIS — I4891 Unspecified atrial fibrillation: Secondary | ICD-10-CM | POA: Diagnosis not present

## 2019-11-20 DIAGNOSIS — Z4789 Encounter for other orthopedic aftercare: Secondary | ICD-10-CM | POA: Diagnosis not present

## 2019-11-20 DIAGNOSIS — F419 Anxiety disorder, unspecified: Secondary | ICD-10-CM | POA: Diagnosis not present

## 2019-11-20 DIAGNOSIS — I10 Essential (primary) hypertension: Secondary | ICD-10-CM | POA: Diagnosis not present

## 2019-11-20 DIAGNOSIS — J479 Bronchiectasis, uncomplicated: Secondary | ICD-10-CM | POA: Diagnosis not present

## 2019-11-20 DIAGNOSIS — K589 Irritable bowel syndrome without diarrhea: Secondary | ICD-10-CM | POA: Diagnosis not present

## 2019-11-20 DIAGNOSIS — M25551 Pain in right hip: Secondary | ICD-10-CM | POA: Diagnosis not present

## 2019-11-20 DIAGNOSIS — D6869 Other thrombophilia: Secondary | ICD-10-CM | POA: Diagnosis not present

## 2019-11-20 DIAGNOSIS — M2550 Pain in unspecified joint: Secondary | ICD-10-CM | POA: Diagnosis not present

## 2019-11-20 DIAGNOSIS — F329 Major depressive disorder, single episode, unspecified: Secondary | ICD-10-CM | POA: Diagnosis not present

## 2019-11-20 DIAGNOSIS — E785 Hyperlipidemia, unspecified: Secondary | ICD-10-CM | POA: Diagnosis not present

## 2019-11-20 DIAGNOSIS — Z7401 Bed confinement status: Secondary | ICD-10-CM | POA: Diagnosis not present

## 2019-11-20 LAB — BPAM RBC
Blood Product Expiration Date: 202106182359
Blood Product Expiration Date: 202106182359
ISSUE DATE / TIME: 202105171915
ISSUE DATE / TIME: 202105171915
Unit Type and Rh: 5100
Unit Type and Rh: 5100

## 2019-11-20 LAB — TYPE AND SCREEN
ABO/RH(D): O POS
Antibody Screen: NEGATIVE
Unit division: 0
Unit division: 0

## 2019-11-20 LAB — PROTIME-INR
INR: 1.9 — ABNORMAL HIGH (ref 0.8–1.2)
Prothrombin Time: 20.9 seconds — ABNORMAL HIGH (ref 11.4–15.2)

## 2019-11-20 LAB — SARS CORONAVIRUS 2 (TAT 6-24 HRS): SARS Coronavirus 2: NEGATIVE

## 2019-11-20 MED ORDER — WARFARIN 1.25 MG HALF TABLET
1.2500 mg | ORAL_TABLET | Freq: Once | ORAL | Status: AC
  Administered 2019-11-20: 1.25 mg via ORAL
  Filled 2019-11-20: qty 1

## 2019-11-20 MED ORDER — TRAMADOL HCL 50 MG PO TABS: 50.0000 mg | ORAL_TABLET | Freq: Four times a day (QID) | ORAL | 0 refills | Status: DC | PRN

## 2019-11-20 NOTE — Discharge Instructions (Signed)

## 2019-11-20 NOTE — Plan of Care (Signed)

## 2019-11-20 NOTE — TOC Transition Note (Addendum)
Transition of Care [facility]) - CM/SW Discharge Note   Patient Details  Name: Stephanie Cortez MRN: [id number] Date of Birth: 1933/08/17  Transition of Care [facility]) CM/SW Contact:  Sharin Mons, RN Phone Number: [phone] 11/20/2019, 3:20 PM   Clinical Narrative:    Patient will DC to: [facility] Anticipated DC date: 11/20/2019 Family notified: daughter Transport by: Corey Harold   Per MD patient ready for DC today . RN, patient, patient's family, and facility notified of DC. Discharge Summary and FL2 sent to facility. RN to call report prior to discharge [phone]  ). Rm 303 B. DC packet on chart. Ambulance transport requested for patient.   RNCM will sign off for now as intervention is no longer needed. Please consult [place] again if new needs arise. [name] (Daughter)     [phone]        Final next level of care: Skilled Nursing Facility([facility]) Barriers to Discharge: No Barriers Identified   Patient Goals and CMS Choice   CMS Medicare.gov Compare Post Acute Care list provided to:: Other (Comment Required) Choice offered to / list presented to : Patient  Discharge Placement                       Discharge Plan and Services   Discharge Planning Services: CM Consult Post Acute Care Choice: Home Health                    HH Arranged: PT, OT, Social Work [employer] Agency: [facility] [employer] Date [place]: 11/17/19 Time [place]: [place] Representative spoke with at [place]: [place] ([place]) Interventions     Readmission Risk Interventions No flowsheet data found.

## 2019-11-20 NOTE — Progress Notes (Signed)
Occupational Therapy Treatment Patient Details Name: Stephanie Cortez MRN: [id number] DOB: 07-04-1933 Today's Date: 11/20/2019    History of present illness Patient is a 86 year old female readmitted with Rt periprosthetic fx s/p femoral stem revision 5/17. Pt with fall 4/27 with closed femoral neck fracture s/p R hemiarthoplasty on 4/28 who D/C'd to CIR. PMhx:HTN, AFib,GERD, anxiety, CHF, hearing loss   OT comments  Pt with good participation. Able to complete stand pivot transfer to [facility] with mod A @ RW level. Min vc for posterior hip precautions. Daughter present and very supportive.Complaining of dizziness. Systolic BP 99991111 sitting. After activity, BP 107/50 - sitting (most likely lower due to time required to settle pt and take BP). Nsg aware. Encouraged increased time OOB and use of incentive spirometer. Continue to recommend rehab at [facility]. Family very appreciative.   Follow Up Recommendations  Supervision/Assistance - 24 hour;SNF    Equipment Recommendations  3 in 1 bedside commode    Recommendations for Other Services      Precautions / Restrictions Precautions Precautions: Fall;Posterior Hip Required Braces or Orthoses: Knee Immobilizer - Right Knee Immobilizer - Right: (remove for therapy) Restrictions Weight Bearing Restrictions: Yes RLE Weight Bearing: Weight bearing as tolerated       Mobility Bed Mobility Overal bed mobility: Needs Assistance Bed Mobility: Supine to Sit     Supine to sit: Mod assist        Transfers Overall transfer level: Needs assistance Equipment used: Rolling walker (2 wheeled) Transfers: Sit to/from [facility] Sit to Stand: Mod assist Stand pivot transfers: Mod assist            Balance Overall balance assessment: Needs assistance   Sitting balance-Leahy Scale: Fair       Standing balance-Leahy Scale: Poor                             ADL either performed or assessed with clinical judgement    ADL Overall ADL's : Needs assistance/impaired                         Toilet Transfer: Moderate assistance;+2 for physical assistance;Stand-pivot;Ambulation;RW;BSC Toilet Transfer Details (indicate cue type and reason): took a few steps to [facility]; complaining of feeling dizzy Toileting- Clothing Manipulation and Hygiene: Moderate assistance Toileting - Clothing Manipulation Details (indicate cue type and reason): ableto clean front however total A for bottom     Functional mobility during ADLs: Moderate assistance;+2 for safety/equipment;Rolling walker;Cueing for safety General ADL Comments: reviewed posterior hip precautions     Vision       Perception     Praxis      Cognition Arousal/Alertness: Awake/alert Behavior During Therapy: WFL for tasks assessed/performed;Anxious Overall Cognitive Status: Within Functional Limits for tasks assessed                                          Exercises Exercises: Other exercises Other Exercises Other Exercises: inceentive spirometer x 10 - able to pull 750 ml   Shoulder Instructions       General Comments      Pertinent Vitals/ Pain       Pain Assessment: 0-10 Pain Score: 5  Pain Location: R hip/groin with mobility Pain Descriptors / Indicators: Guarding;Aching Pain Intervention(s): Limited activity within patient's tolerance  Home  Living                                          Prior Functioning/Environment              Frequency  Min 2X/week        Progress Toward Goals  OT Goals(current goals can now be found in the care plan section)  Progress towards OT goals: Progressing toward goals  Acute Rehab OT Goals Patient Stated Goal: to get stronger and more independent OT Goal Formulation: With patient Time For Goal Achievement: 12/01/19 Potential to Achieve Goals: Good ADL Goals Pt Will Perform Grooming: standing;with min guard assist Pt Will Perform Lower  Body Dressing: with adaptive equipment;sit to/from stand;with min guard assist Pt Will Transfer to Toilet: with min guard assist;bedside commode Pt Will Perform Toileting - Clothing Manipulation and hygiene: with min guard assist;sit to/from stand Additional ADL Goal #1: Pt will progress to EOB with minguard in preparation for ADL.  Plan Discharge plan remains appropriate    Co-evaluation                 AM-PAC OT "6 Clicks" Daily Activity     Outcome Measure   Help from another person eating meals?: None Help from another person taking care of personal grooming?: A Little Help from another person toileting, which includes using toliet, bedpan, or urinal?: A Lot Help from another person bathing (including washing, rinsing, drying)?: A Lot Help from another person to put on and taking off regular upper body clothing?: A Little Help from another person to put on and taking off regular lower body clothing?: A Lot 6 Click Score: 16    End of Session Equipment Utilized During Treatment: Gait belt;Rolling walker  OT Visit Diagnosis: Other abnormalities of gait and mobility (R26.89);Unsteadiness on feet (R26.81);Muscle weakness (generalized) (M62.81);History of falling (Z91.81);Pain Pain - Right/Left: Right Pain - part of body: Hip   Activity Tolerance Patient tolerated treatment well   Patient Left in chair;with call bell/phone within reach;with family/visitor present   Nurse Communication Mobility status;Other (comment)(BP/ complaints of dizziness)        Time: 1020-1110 OT Time Calculation (min): 50 min  Charges: OT General Charges $OT Visit: 1 Visit OT Treatments $Self Care/Home Management : 38-52 mins  Maurie Boettcher, OT/L   Acute OT Clinical Specialist Acute Rehabilitation Services Pager [phone] Office [phone]    [facility] 11/20/2019, 11:29 AM

## 2019-11-20 NOTE — Progress Notes (Signed)
1900 Discharged patient to [facility]. Reports given to Renae. Patient was transported [place]. Patients daughter aware of discharge.

## 2019-11-20 NOTE — Progress Notes (Signed)
Physical Therapy Treatment Patient Details Name: Stephanie Cortez MRN: [id number] DOB: 05/25/1934 Today's Date: 11/20/2019    History of Present Illness Patient is a 86 year old female readmitted with Rt periprosthetic fx s/p femoral stem revision 5/17. Pt with fall 4/27 with closed femoral neck fracture s/p R hemiarthoplasty on 4/28 who D/C'd to CIR. PMhx:HTN, AFib,GERD, anxiety, CHF, hearing loss    PT Comments    Pt reclined in chair on arrival  Started session with LE exercises then progressed to transfers and short bout of gt training.  Pt only able to tale 3 steps before presenting with very pale skin and c/o increasing dizziness.  Returned patient to supine/reclined position in chair and allowed patient to rest while obtaining a dinamap.  Pt's BP in reclined supine position was 125/63, pt then moved to sitting where BP increased systolically to [id number], Pt stood and BP dropped to 104/66.  In standing noted to have a BM stood a little longer for pericare then returned to recliner and BP obtained @ 94/50.  Informed nursing of drop in BP with standing.  Continue to recommend rehab in a post acute setting to maximize functional gains.     Follow Up Recommendations  SNF;Supervision/Assistance - 24 hour     Equipment Recommendations  Rolling walker with 5" wheels;3in1 (PT);Wheelchair (measurements PT)    Recommendations for Other Services       Precautions / Restrictions Precautions Precautions: Fall;Posterior Hip Precaution Booklet Issued: No Precaution Comments: watch BP Required Braces or Orthoses: Knee Immobilizer - Right Knee Immobilizer - Right: (remove for therapy) Restrictions Weight Bearing Restrictions: Yes RLE Weight Bearing: Weight bearing as tolerated    Mobility  Bed Mobility Overal bed mobility: Needs Assistance Bed Mobility: Supine to Sit     Supine to sit: Mod assist     General bed mobility comments: Pt seated in recliner on arrival.  Transfers Overall  transfer level: Needs assistance Equipment used: Rolling walker (2 wheeled) Transfers: Sit to/from Stand Sit to Stand: Mod assist;+2 physical assistance Stand pivot transfers: Mod assist       General transfer comment: Pt performed transfer from lower recliner, utilized +2 assistance to rise into standing with cues to push from seated surface vs. pulling into standing. Pt reports dizziness each time during standing.  Ambulation/Gait Ambulation/Gait assistance: Mod assist +2 for chair follow.  Gait Distance (Feet): 4 Feet Assistive device: Rolling walker (2 wheeled) Gait Pattern/deviations: Step-to pattern;Decreased stride length;Decreased stance time - right;Decreased weight shift to right Gait velocity: decreased   General Gait Details: Cues for upper trunk control, RW positioning and sequencing.   Pt continues to fatigue quickly. She does reports dizziness this session during gt.  Drop in BP noted.   Stairs             Wheelchair Mobility    Modified Rankin (Stroke Patients Only)       Balance Overall balance assessment: Needs assistance Sitting-balance support: Single extremity supported;Feet supported Sitting balance-Leahy Scale: Fair       Standing balance-Leahy Scale: Poor Standing balance comment: reliant on external support                             Cognition Arousal/Alertness: Awake/alert Behavior During Therapy: WFL for tasks assessed/performed;Anxious Overall Cognitive Status: Within Functional Limits for tasks assessed Area of Impairment: Memory;Following commands  Memory: Decreased short-term memory;Decreased recall of precautions Following Commands: Follows one step commands inconsistently;Follows one step commands with increased time       General Comments: for basic mobility       Exercises General Exercises - Lower Extremity Ankle Circles/Pumps: AROM;Both;20 reps;Supine Quad Sets: AROM;Right;10  reps;Supine Short Arc Quad: AAROM;Right;10 reps;Supine Heel Slides: AAROM;Right;10 reps;Supine Hip ABduction/ADduction: AAROM;Right;10 reps;Supine Other Exercises Other Exercises: inceentive spirometer x 10 - able to pull 750 ml    General Comments        Pertinent Vitals/Pain Pain Assessment: 0-10 Pain Score: 5  Pain Location: R hip/groin with mobility Pain Descriptors / Indicators: Guarding;Aching Pain Intervention(s): Monitored during session;Repositioned    Home Living                      Prior Function            PT Goals (current goals can now be found in the care plan section) Acute Rehab PT Goals Patient Stated Goal: to get stronger and more independent Potential to Achieve Goals: Fair Progress towards PT goals: Progressing toward goals    Frequency    Min 5X/week      PT Plan Current plan remains appropriate    Co-evaluation              AM-PAC PT "6 Clicks" Mobility   Outcome Measure  Help needed turning from your back to your side while in a flat bed without using bedrails?: A Lot Help needed moving from lying on your back to sitting on the side of a flat bed without using bedrails?: A Lot Help needed moving to and from a bed to a chair (including a wheelchair)?: A Lot Help needed standing up from a chair using your arms (e.g., wheelchair or bedside chair)?: A Lot Help needed to walk in hospital room?: Total Help needed climbing 3-5 steps with a railing? : Total 6 Click Score: 10    End of Session Equipment Utilized During Treatment: Gait belt Activity Tolerance: Patient limited by fatigue Patient left: in chair;with call bell/phone within reach Nurse Communication: Mobility status(drop in BP.) PT Visit Diagnosis: Difficulty in walking, not elsewhere classified (R26.2);Other abnormalities of gait and mobility (R26.89);Muscle weakness (generalized) (M62.81)     Time: [id number] PT Time Calculation (min) (ACUTE ONLY): 32  min  Charges:  $Therapeutic Exercise: 8-22 mins $Therapeutic Activity: 8-22 mins                     Erasmo Leventhal , PTA Acute Rehabilitation Services Pager [phone] Office [phone]     Avina Eberle Eli Hose 11/20/2019, 1:07 PM

## 2019-11-20 NOTE — Progress Notes (Signed)
Pt was up by PT/OT to the chair. Dizziness and feeling light-headed noted. Ortho V/S was checked (see flow).  BP goes down to 94/50 P-68 after standing. Dr Lorin Mercy notified. Will apply Teds (thigh-high).

## 2019-11-20 NOTE — Progress Notes (Signed)
   Subjective: 4 Days Post-Op Procedure(s) (LRB): TOTAL HIP REVISION BIPOLAR TO CEMENTED BIPOLAR (Right) Patient reports pain as moderate and severe.    Objective: Vital signs in last 24 hours: Temp:  [97.4 F (36.3 C)-98.9 F (37.2 C)] 98 F (36.7 C) (05/21 0728) Pulse Rate:  [57-70] 66 (05/21 0728) Resp:  [15-20] 18 (05/21 0728) BP: (114-129)/(50-69) 129/69 (05/21 0728) SpO2:  [87 %-98 %] 96 % (05/21 0738)  Intake/Output from previous day: 05/20 0701 - 05/21 0700 In: 775.6 [P.O.:720; I.V.:55.6] Out: 700 [Urine:700] Intake/Output this shift: No intake/output data recorded.  Recent Labs    11/18/19 0252  HGB 10.5*   Recent Labs    11/18/19 0252  WBC 12.5*  RBC 3.51*  HCT 32.8*  PLT 323   No results for input(s): NA, K, CL, CO2, BUN, CREATININE, GLUCOSE, CALCIUM in the last 72 hours. Recent Labs    11/19/19 0252 11/20/19 0645  INR 1.6* 1.9*    Neurologically intact No results found.  Assessment/Plan: 4 Days Post-Op Procedure(s) (LRB): TOTAL HIP REVISION BIPOLAR TO CEMENTED BIPOLAR (Right) Up with therapy again today. Discharge to [facility].  Office one week .Rx for tramadol placed on chart.   Marybelle Killings 11/20/2019, 9:46 AM

## 2019-11-20 NOTE — Progress Notes (Signed)
ANTICOAGULATION CONSULT NOTE - Follow Up Consult  Pharmacy Consult for warfarin Indication: atrial fibrillation, s/p R THA  Patient Measurements: Height: 5\' 6"  (167.6 cm) Weight: 84.2 kg (185 lb 10 oz) IBW/kg (Calculated) : 59.3  Vital Signs: Temp: 98 F (36.7 C) (05/21 0728) Temp Source: Oral (05/21 0353) BP: 129/69 (05/21 0728) Pulse Rate: 66 (05/21 0728)  Labs: Recent Labs    11/18/19 0252 11/19/19 0252 11/20/19 0645  HGB 10.5*  --   --   HCT 32.8*  --   --   PLT 323  --   --   LABPROT 17.6* 18.7* 20.9*  INR 1.5* 1.6* 1.9*    Estimated Creatinine Clearance: 57.3 mL/min (by C-G formula based on SCr of 0.65 mg/dL).  Assessment: 86 year old W on warfarin PTA for hx of atrial fibrillation (CHADS2VASc = 4). Pt admitted for fall d/t hip fracture, underwent right hip arthroplasty on 4/28. Pharmacy consulted 10/30/19 to resume warfarin. Patient planned for orthopedic revision surgery on 5/17 so warfarin was held starting 5/13 and pharmacy consulted to start heparin.   PTA warfarin dose = 1.25 mg on MWF, 2.5 mg all other days    Warfarin resumed 5/18 and INR remains subtherapeutic but is trending up nicely.   Goal of Therapy:  INR 2-3 Antiplatelet monitoring per protocol   Plan:  Warfarin 1.25mg  PO x 1 tonight - pts regular home dose Daily INR  Stephanie Cortez, PharmD, BCPS Clinical Pharmacist Please see AMION for all pharmacy numbers 11/20/2019 8:00 AM

## 2019-11-20 NOTE — Discharge Summary (Addendum)
Physician Discharge Summary  Patient ID: Stephanie Cortez MRN: [id number] DOB/AGE: Jun 24, 1934 86 y.o.  Admit date: 11/16/2019 Discharge date: 11/20/2019  Admission Diagnoses: Right femur periprosthetic fracture with unstable bipolar hemiarthroplasty.  Discharge Diagnoses:  Active Problems:   Periprosthetic fracture around internal prosthetic hip joint, initial encounter   Periprosthetic fracture around internal prosthetic hip joint   Discharged Condition: stable  Hospital Course: Patient initially had fall with the femoral neck fracture treated with bipolar hemiarthroplasty press-fit was making progress in the hospital and transferred to rehab.  Over several days her ability to ambulate decrease pain got worse and x-ray demonstrated periprosthetic fracture.  When patient was in the hospital postop x-rays showed good position and alignment.  Patient had not had a fall but sometime during rehab periprosthetic femur fracture occurred with subsidence and instability shortening and lesser trochanter was a separate piece.  Patient was transferred from rehab floor back to the hospital for treatment on 11/15/2019 and the following day on 11/16/2019 she  underwent removal of the femoral stem and was converted to a cemented bipolar hemiarthroplasty.  Postoperatively she had problems for 2 days with dizziness when she went to sitting position her hemoglobin was stable at 10.7.  Patient had been on Coumadin for atrial fibrillation this was stopped before she came back to the hospital for revision surgery and was restarted postoperatively.  Patient alternates 2.5 mg with 1.25 mg Coumadin daily.  Patient has been taking tramadol for pain when she took Vicodin or stronger narcotic medicine she had increased problems with orthostatic changes.  Current INR was 1.9 today.  Prior to that it had been 1.6, 1.5.  Patient needs to continue chronic Coumadin for her atrial fibrillation.  Patient has been Covid negative on  admission and a rapid Covid test for transfer to a skilled facility is pending at this time.  Hemoglobin yesterday was 10.5 and stable.  She received 1 unit of packed cells while she was in surgery.  Urine output has been good.  Maximum distance patient had walked was yesterday 6 feet.  She is weightbearing as tolerated and postop x-ray shows a well-positioned cemented hemiarthroplasty.  She has been on posterior hip precautions.  Office follow-up will be with Dr. Lorin Mercy in 1 week.  She will be transferred to collapse nursing facility.  Dressing was changed on discharge with trace bloody drainage on the Mepilex dressing.  Consults:   Significant Diagnostic Studies: INR 1.9 . Hgb 10,5 on 5/19.  Treatments: surgery: Conversion to cemented bipolar hemiarthroplasty right hip on 11/16/2019.  Discharge Exam: Blood pressure 129/69, pulse 66, temperature 98 F (36.7 C), resp. rate 18, height 5\' 6"  (1.676 m), weight 84.2 kg, SpO2 96 %. Physical exam.  Lungs are clear at time of discharge.  Vital signs been stable.  She is working with physical therapy but making slow progress.  On 11/19/2019 she ambulated 6 feet.  She is weightbearing as tolerated.  Disposition: Discharge disposition: 03-Skilled [place] information for follow-up providers    [facility] Follow up.   Specialty: Home Health Services Why: home health services arranged Contact information: [address] [place] [address] [phone]        Marybelle Killings, MD Follow up in 1 week(s).   Specialty: Orthopedic Surgery Contact information: [place] [address] [phone]            Contact information for after-discharge care  Destination    [facility] Preferred SNF .   Service: Skilled Nursing Contact information: [place] [phone]                  Signed: Marybelle Killings 11/20/2019, 9:54 AM    Discharge Medications:   Allergies as of 11/20/2019      Reactions   Doxycycline Other (See Comments)   Rash on face and neck   Flagyl [metronidazole] Nausea And Vomiting   Moxifloxacin Other (See Comments)    Weakness/fatigue   Pantoprazole Sodium Rash   Penicillins Rash   Has patient had a PCN reaction causing immediate rash, facial/tongue/throat swelling, SOB or lightheadedness with hypotension:unsure Has patient had a PCN reaction causing severe rash involving mucus membranes or skin necrosis:unsure Has patient had a PCN reaction that required hospitalization:No Has patient had a PCN reaction occurring within the last 10 years:Yes Cannot recall exact reaction due to time lapse If all of the above answers are "NO", then may proceed with Cephalosporin use.      Medication List    STOP taking these medications   acetaminophen 325 MG tablet Commonly known as: TYLENOL   HYDROcodone-acetaminophen 5-325 MG tablet Commonly known as: NORCO/VICODIN     TAKE these medications   Biotin 5000 MCG Tabs Take 5,000 mcg by mouth daily.   bisoprolol 10 MG tablet Commonly known as: ZEBETA Take 1 tablet by mouth daily. Please monitor your blood pressure and heart rate daily for a couple of weeks to make sure they remain stable. What changed:   how much to take  how to take this  when to take this  additional instructions   dextromethorphan-guaiFENesin 30-600 MG 12hr tablet Commonly known as: MUCINEX DM Take 1-2 tablets by mouth 2 (two) times daily as needed (for allegies/respiratory issues.).   diltiazem 240 MG 24 hr capsule Commonly known as: CARDIZEM CD Take 1 capsule (240 mg total) by mouth daily. What changed: when to take this   diphenhydrAMINE 25 MG tablet Commonly known as: BENADRYL Take 25 mg by mouth every 6 (six) hours as needed for allergies.   docusate sodium 100 MG capsule Commonly known as: COLACE Take 1 capsule (100 mg total) by  mouth 2 (two) times daily.   Flonase 50 MCG/ACT nasal spray Generic drug: fluticasone Place 1 spray into both nostrils 2 (two) times daily as needed (for nasal congestion).   FLUoxetine 10 MG tablet Commonly known as: PROZAC Take 10 mg by mouth 2 (two) times daily as needed (anxiety).   folic acid A999333 MCG tablet Commonly known as: FOLVITE Take 1 mg by mouth 3 (three) times a week. MWF   furosemide 20 MG tablet Commonly known as: LASIX Take 20 mg by mouth daily as needed for fluid.   levothyroxine 50 MCG tablet Commonly known as: SYNTHROID Take 1 tablet (50 mcg total) by mouth daily. What changed: when to take this   olmesartan 40 MG tablet Commonly known as: BENICAR Take 40 mg by mouth daily.   omeprazole 20 MG capsule Commonly known as: PRILOSEC TAKE 1 CAPSULE(20 MG) BY MOUTH DAILY What changed: See the new instructions.   pravastatin 40 MG tablet Commonly known as: PRAVACHOL Take 40 mg by mouth at bedtime.   Symbicort 80-4.5 MCG/ACT inhaler Generic drug: budesonide-formoterol INHALE 2 PUFFS INTO THE LUNGS TWICE DAILY What changed: when to take this   traMADol 50 MG tablet Commonly known as: ULTRAM Take 1 tablet (  50 mg total) by mouth every 6 (six) hours as needed for moderate pain.   vitamin B-12 1000 MCG tablet Commonly known as: CYANOCOBALAMIN Take 1,000 mcg by mouth 3 (three) times a week. MWF   Vitamin D-3 125 MCG (5000 UT) Tabs Take 5,000 Units by mouth daily.   warfarin 2.5 MG tablet Commonly known as: COUMADIN Take 1/2-1 tablet by mouth daily as directed by Coumadin Clinic What changed:   how much to take  how to take this  when to take this  additional instructions   zinc gluconate 50 MG tablet Take 50 mg by mouth daily.

## 2019-11-22 DIAGNOSIS — I4891 Unspecified atrial fibrillation: Secondary | ICD-10-CM | POA: Diagnosis not present

## 2019-11-22 DIAGNOSIS — I509 Heart failure, unspecified: Secondary | ICD-10-CM | POA: Diagnosis not present

## 2019-11-22 DIAGNOSIS — M9701XA Periprosthetic fracture around internal prosthetic right hip joint, initial encounter: Secondary | ICD-10-CM | POA: Diagnosis not present

## 2019-11-22 DIAGNOSIS — D6869 Other thrombophilia: Secondary | ICD-10-CM | POA: Diagnosis not present

## 2019-11-23 ENCOUNTER — Telehealth: Payer: Self-pay | Admitting: Internal Medicine

## 2019-11-23 NOTE — Telephone Encounter (Signed)
Patient's daughter, [name], is calling in regards to hip surgeries that the patient had on 10/28/19 and 11/16/19. She states the patient is now in skilled nursing and appointment previously scheduled for 11/26/19 with Tommye Standard has been cancelled. [name] is requesting to speak with Dr. Olin Pia nurse regarding whether or not the patient's appointment needs to be rescheduled. Please advise.

## 2019-11-24 NOTE — Telephone Encounter (Signed)
Attempted phone call.  Left voicemail message to contact RN at [phone]. 

## 2019-11-26 ENCOUNTER — Ambulatory Visit: Payer: Medicare Other | Admitting: Physician Assistant

## 2019-11-27 ENCOUNTER — Ambulatory Visit: Payer: Medicare Other | Admitting: Internal Medicine

## 2019-11-27 ENCOUNTER — Other Ambulatory Visit: Payer: Self-pay | Admitting: *Deleted

## 2019-11-27 NOTE — Patient Outreach (Signed)
Late entry for 11/26/19  Screened for potential [facility] Care Management needs as a benefit of  [employer].  Stephanie Cortez is receiving skilled therapy at [facility].   Writer attended telephonic interdisciplinary team meeting to assess for disposition needs and transition plan for resident.   Facility reports member has had complaints of pain. Reports blood pressure has been running low. Medication adjustments being made.   Will continue to follow for transition plans and potential [facility] needs while member resides in SNF.     Marthenia Rolling, MSN-Ed, RN,BSN [place] Acute Care Coordinator [phone] [facility]) [phone]  (Toll free office)

## 2019-12-02 ENCOUNTER — Other Ambulatory Visit: Payer: Self-pay

## 2019-12-02 ENCOUNTER — Ambulatory Visit (INDEPENDENT_AMBULATORY_CARE_PROVIDER_SITE_OTHER): Payer: Medicare Other | Admitting: Orthopaedic Surgery

## 2019-12-02 ENCOUNTER — Ambulatory Visit (INDEPENDENT_AMBULATORY_CARE_PROVIDER_SITE_OTHER): Payer: Medicare Other

## 2019-12-02 DIAGNOSIS — S72001A Fracture of unspecified part of neck of right femur, initial encounter for closed fracture: Secondary | ICD-10-CM

## 2019-12-02 NOTE — Progress Notes (Signed)
Office Visit Note   Patient: Stephanie Cortez           Date of Birth: 12-27-1933           MRN: [id number] Visit Date: 12/02/2019              Requested by: [name], [place],  [place] [address] PCP: [name], MD   Assessment & Plan: Visit Diagnoses:  1. Closed right hip fracture, initial encounter ([place])     Plan:  WBAT . She has walked 14 steps on the parallel bars.  Patient's daughter and son-in-law are at the visit today and they are making arrangements so when she leaves the skilled facility and goes back home they have care available for her.  Currently she cannot get out of bed on her own.  X-rays look good.  Return 5 weeks.  Patient had some redness and was placed on antibiotics.  Incision looks good today without drainage and staples are harvested.  Follow-Up Instructions: Return in about 5 weeks (around 01/06/2020).   Orders:  Orders Placed This Encounter  Procedures  . XR HIP UNILAT W OR W/O PELVIS 2-3 VIEWS RIGHT   No orders of the defined types were placed in this encounter.     Procedures: No procedures performed   Clinical Data: No additional findings.   Subjective: Chief Complaint  Patient presents with  . Right Hip - Pain    HPI postop cemented revision hemiarthroplasty.  Staples removed Steri-Strips applied. She has made 14 steps with PT so far. Progress delayed for about 4 days due to dizziness with standing.   Review of Systems no change   Objective: Vital Signs: LMP  (LMP Unknown)   Physical Exam no change  Ortho Exam slight erythema at the midportion of the incision.  Specialty Comments:  No specialty comments available.  Imaging: XR HIP UNILAT W OR W/O PELVIS 2-3 VIEWS RIGHT  Result Date: 12/02/2019 Standing AP both hips frog-leg lateral and single view right hip shows cemented stem bipolar hemiarthroplasty 3 cables with still displacement of the lesser trochanter but the top cable holds the  proximal portion.  No subsidence of the prosthesis cement mantle shows no evidence of loosening. Impression: Satisfactory bipolar revision with cemented stem right hip.    PMFS History: Patient Active Problem List   Diagnosis Date Noted  . Periprosthetic fracture around internal prosthetic hip joint 11/16/2019  . Periprosthetic fracture around internal prosthetic hip joint, initial encounter   . Chronic obstructive pulmonary disease ([place])   . Essential hypertension   . Chronic anticoagulation   . Acute blood loss anemia   . Transaminitis   . Hypoalbuminemia due to protein-calorie malnutrition ([place])   . Labile blood pressure   . Drug induced constipation   . Postoperative pain   . Subcapital fracture of femur ([place]) 10/30/2019  . Post-operative state   . Prerenal azotemia   . Leukocytosis   . Closed right hip fracture, initial encounter ([place]) 10/27/2019  . Hoarseness 02/19/2018  . Sensorineural hearing loss (SNHL) of both ears 02/19/2018  . Tinnitus, bilateral 02/19/2018  . Cough 09/06/2017  . Cough variant asthma 08/19/2017  . Acute bronchitis 02/20/2016  . Pulmonary air trapping 01/04/2016  . Encounter for preoperative pulmonary examination 01/04/2016  . NSIP (nonspecific interstitial pneumonia) ([place]) 09/26/2015  . Chronic sinusitis 09/26/2015  . ILD (interstitial lung disease) ([place]) 07/12/2014  . Upper airway cough syndrome 09/23/2013  . Encounter for  therapeutic drug monitoring 08/11/2013  . Congestive heart failure ([place]) 07/08/2013  . Coronary artery calcification seen on CAT scan 03/15/2013  . Nodule of left lung 03/15/2013  . Orthostatic lightheadedness 02/09/2013  . Situational mixed anxiety and depressive disorder   . Hyponatremia 04/03/2012  . Diverticulitis 01/01/2012  . GERD with stricture 01/01/2012  . Hypothyroid 11/23/2010  . Edema 10/04/2010  . Bronchiectasis ([place])   . HYPERTENSION, BENIGN 10/22/2008  . A-fib [facility])    Past Medical History:  Diagnosis  Date  . Anemia    - Hgb 9.7gm% on 07/13/2008 in [place] -  Hgg 129gm% wiht normal irone levsl and ferritin 10/27/2008 in [place] Recurrent otitis/sinusitis  . Anxiety    chronic BZ prn  . Atrial fibrillation (HCC)    chronic anticoag  . Bronchiectasis    >PFT 07/13/2008 in [place] 1.9L/76%, FVC 2.45L/74, Ratio 79, TLC 121%, DLCO 64%  AE BRonchiectasis - Dec 2010.New Rx:  outpatient - Feb 2011 - Rx outpatient  . CHF (congestive heart failure) ([place])   . COPD (chronic obstructive pulmonary disease) (HCC)    bronchiectasis  . Cricopharyngeal achalasia   . Depression   . Diverticulosis   . Dyslipidemia   . Eczema   . GERD with stricture   . Glaucoma   . H. pylori infection   . Hypertension   . Hyponatremia    chronic, s/p endo eval 06/2012  . Hypothyroid   . IBS (irritable bowel syndrome)   . Lumbar disc disease   . Neuropathy of both feet   . Segmental colitis ([place])   . Vertigo   . Wears glasses     Family History  Problem Relation Age of Onset  . Hypertension Mother   . Stroke Mother   . Emphysema Brother   . Other Father        miner's lung  . Cancer Father        Lung  . Colon polyps Sister   . Pancreatic cancer Sister   . Kidney disease Sister   . Atrial fibrillation Other        siblings    Past Surgical History:  Procedure Laterality Date  . BREAST SURGERY     br bx  . CARDIAC CATHETERIZATION  07/01/2013  . CATARACT EXTRACTION     both  . COLONOSCOPY    . ESOPHAGOSCOPY W/ BOTOX INJECTION  12/11/2011   Procedure: ESOPHAGOSCOPY WITH BOTOX INJECTION;  Surgeon: Rozetta Nunnery, MD;  Location: [facility];  Service: ENT;  Laterality: N/A;  esophogoscopy with dilation, botox injection  . FOOT SURGERY  03/14/2011   gastroc slide-rt  . HIP ARTHROPLASTY Right 10/28/2019   Procedure: ARTHROPLASTY BIPOLAR HIP (HEMIARTHROPLASTY);  Surgeon: Marybelle Killings, MD;  Location: [facility];  Service: Orthopedics;  Laterality: Right;  . TOTAL ABDOMINAL HYSTERECTOMY      . TOTAL HIP REVISION Right 11/16/2019   Procedure: TOTAL HIP REVISION BIPOLAR TO CEMENTED BIPOLAR;  Surgeon: Marybelle Killings, MD;  Location: [facility];  Service: Orthopedics;  Laterality: Right;  . WISDOM TOOTH EXTRACTION     Social History   Occupational History  . Occupation: retired Product manager: [employer]  Tobacco Use  . Smoking status: Never Smoker  . Smokeless tobacco: Never Used  Substance and Sexual Activity  . Alcohol use: No  . Drug use: No  . Sexual activity: Not on file    Comment: Hysterectomy

## 2019-12-03 ENCOUNTER — Other Ambulatory Visit: Payer: Self-pay | Admitting: *Deleted

## 2019-12-03 NOTE — Patient Outreach (Signed)
Screened for potential [facility] Care Management needs as a benefit of  [employer] Medicare.  Stephanie Cortez is currently receiving skilled therapy at [facility].   Writer attended telephonic interdisciplinary team meeting to assess for disposition needs and transition plan for resident.   Facility reports member continues to have issues with orthostatic blood pressures. Medication adjustments being made. Receiving antibiotic for cellulitis until June 7th. States daughter [name] calls the facility frequently for updates.   Will plan outreach accordingly to discuss [place] Management services. Will continue to follow while member resides in SNF.    Marthenia Rolling, MSN-Ed, RN,BSN [place] Acute Care Coordinator [phone] [facility]) [phone]  (Toll free office)

## 2019-12-09 NOTE — Telephone Encounter (Signed)
Spoke with pt's daughter, [name], [place] who reports pt remains in rehab d/t hip surgery.  Uncertain at this time when pt will be discharged.  Advised pt's daughter to contact [place] once a pending discharge is established to reschedule pt's follow up appointment.  Pt's daughter verbalizes understanding and agrees with current plan.

## 2019-12-15 ENCOUNTER — Other Ambulatory Visit: Payer: Self-pay | Admitting: *Deleted

## 2019-12-15 NOTE — Patient Outreach (Signed)
[place] Acute Care Coordinator follow up  Stephanie Cortez is receiving skilled therapy at [facility].  Telephone made to Stephanie Cortez's daughter/DPR Stephanie Cortez [phone]. Patient identifiers confirmed.   Stephanie Cortez reports member lived alone independently prior to admission. States Stephanie Cortez unfortunately sustained 2 fractures that has resulted in an extended hospitalization, IRF, and SNF. States the plan is for Stephanie Cortez to return home. Stephanie Cortez states she lives 10 minutes away but plans on living with Stephanie Cortez upon returning home from SNF. Stephanie Cortez is is in the process of looking into caregiver agencies.  Stephanie Cortez states she is not completely certain of member's dc date from [facility]. She also asks to Secondary school teacher back when her husband returns home from the office. Stephanie Cortez wants her husband to be present during discussion.  Writer supplied contact information, along with [place] Management and [facility] website information, and information about [facility] supplied via email.     Explained [facility] Care Management services and the potential for [facility]. Stephanie Cortez states she thinks she would be interested in the care coordination piece of [facility] in assisting her in navigating health care needs when Stephanie Cortez returns home. States "this is all so new to [place] as she was so independent before now." Discussed [facility] as an additional resource due to [employer] multiple medical conditions.   Will await for [name] return call about whether she wants writer to make referrals for [facility] Care Management and [facility].   Will collaborate with facility SW as well. Will continue to follow while Stephanie Cortez resides in SNF.    Marthenia Rolling, MSN-Ed, RN,BSN [place] Acute Care Coordinator [phone] [facility]) [phone]  (Toll free office)

## 2019-12-18 DIAGNOSIS — Z7901 Long term (current) use of anticoagulants: Secondary | ICD-10-CM | POA: Diagnosis not present

## 2019-12-18 DIAGNOSIS — I4891 Unspecified atrial fibrillation: Secondary | ICD-10-CM | POA: Diagnosis not present

## 2019-12-22 ENCOUNTER — Other Ambulatory Visit: Payer: Self-pay | Admitting: *Deleted

## 2019-12-22 NOTE — Patient Outreach (Signed)
Member screened for potential [facility] Care Management needs as a benefit of [employer].  Voicemail message received from [employer] (daughter/DPR) requesting return call.   Writer attempted to reach [place] twice at [phone]. No answer. Left HIPAA compliant voicemail message requesting return call.   Will try back at later time.   Marthenia Rolling, MSN-Ed, RN,BSN [place] Acute Care Coordinator [phone] [facility]) [phone]  (Toll free office)

## 2019-12-23 ENCOUNTER — Other Ambulatory Visit: Payer: Self-pay | Admitting: *Deleted

## 2019-12-23 DIAGNOSIS — I10 Essential (primary) hypertension: Secondary | ICD-10-CM

## 2019-12-23 NOTE — Patient Outreach (Signed)
[place] Coordinator follow up  Telephone call made to [name] (daughter/DPR) [phone]. Patient identifiers confirmed.   [name] endorses Mrs. Mochizuki will return home from [facility] today 12/23/19. Mrs. Bottari will have [facility] for home health. [name] states she will stay with Mrs. Cale post SNF. [name] states she is interested in [place] Management and [facility] as previously discussed. [name] states Mrs. Knepp is a little anxious about health management when she gets home. Member previously had issues with orthostatic blood pressure.   Mrs. Vanderzanden has history of CHF, a fib, GERD, HTN, anxiety, fall, femoral neck fracture, s/p right hemiarthroplasty.   Will make referral to [place] and [facility].   [name] confirms best contact number for her is [phone]. [name] expresses appreciation of the additional resources.   Marthenia Rolling, MSN-Ed, RN,BSN [place] Acute Care Coordinator [phone] [facility]) [phone]  (Toll free office)

## 2019-12-24 ENCOUNTER — Other Ambulatory Visit: Payer: Self-pay | Admitting: *Deleted

## 2019-12-24 DIAGNOSIS — W19XXXD Unspecified fall, subsequent encounter: Secondary | ICD-10-CM | POA: Diagnosis not present

## 2019-12-24 DIAGNOSIS — I509 Heart failure, unspecified: Secondary | ICD-10-CM | POA: Diagnosis not present

## 2019-12-24 DIAGNOSIS — F329 Major depressive disorder, single episode, unspecified: Secondary | ICD-10-CM | POA: Diagnosis not present

## 2019-12-24 DIAGNOSIS — K589 Irritable bowel syndrome without diarrhea: Secondary | ICD-10-CM | POA: Diagnosis not present

## 2019-12-24 DIAGNOSIS — J449 Chronic obstructive pulmonary disease, unspecified: Secondary | ICD-10-CM | POA: Diagnosis not present

## 2019-12-24 DIAGNOSIS — Z79891 Long term (current) use of opiate analgesic: Secondary | ICD-10-CM | POA: Diagnosis not present

## 2019-12-24 DIAGNOSIS — I4891 Unspecified atrial fibrillation: Secondary | ICD-10-CM | POA: Diagnosis not present

## 2019-12-24 DIAGNOSIS — Z9181 History of falling: Secondary | ICD-10-CM | POA: Diagnosis not present

## 2019-12-24 DIAGNOSIS — Z96641 Presence of right artificial hip joint: Secondary | ICD-10-CM | POA: Diagnosis not present

## 2019-12-24 DIAGNOSIS — E039 Hypothyroidism, unspecified: Secondary | ICD-10-CM | POA: Diagnosis not present

## 2019-12-24 DIAGNOSIS — S72001D Fracture of unspecified part of neck of right femur, subsequent encounter for closed fracture with routine healing: Secondary | ICD-10-CM | POA: Diagnosis not present

## 2019-12-24 DIAGNOSIS — Z90710 Acquired absence of both cervix and uterus: Secondary | ICD-10-CM | POA: Diagnosis not present

## 2019-12-24 DIAGNOSIS — I110 Hypertensive heart disease with heart failure: Secondary | ICD-10-CM | POA: Diagnosis not present

## 2019-12-24 DIAGNOSIS — M9701XD Periprosthetic fracture around internal prosthetic right hip joint, subsequent encounter: Secondary | ICD-10-CM | POA: Diagnosis not present

## 2019-12-24 DIAGNOSIS — Z7901 Long term (current) use of anticoagulants: Secondary | ICD-10-CM | POA: Diagnosis not present

## 2019-12-24 DIAGNOSIS — K219 Gastro-esophageal reflux disease without esophagitis: Secondary | ICD-10-CM | POA: Diagnosis not present

## 2019-12-24 DIAGNOSIS — Z5181 Encounter for therapeutic drug level monitoring: Secondary | ICD-10-CM | POA: Diagnosis not present

## 2019-12-24 DIAGNOSIS — K220 Achalasia of cardia: Secondary | ICD-10-CM | POA: Diagnosis not present

## 2019-12-24 DIAGNOSIS — K5790 Diverticulosis of intestine, part unspecified, without perforation or abscess without bleeding: Secondary | ICD-10-CM | POA: Diagnosis not present

## 2019-12-24 NOTE — Patient Outreach (Signed)
[facility]) Care Management  12/24/2019  JAHYRA SUKUP 1933/11/25 [id number]   Referral received from post acute care coordinator as member was discharged yesterday from SNF after hip surgery.  Call placed daughter/caregiver [name] to follow up on discharge, member's identity verified.  This care manager introduced self and stated purpose of call.  [facility] care management services explained.  She report she has received call already from [place] (she think this is with [facility] but unsure due to not knowing last name), will have first visit Monday at [facility] made initial assessment today, will have PT/OT and nursing.  She report it has been overwhelming with all of the calls and organizations, request to delay involvement with [facility].  She is open to contacting this care manager within the next few weeks if member still has needs.    This care manager will close case at this time, will contact Lattie Haw with [facility] to follow up on involvement and plan of care.  Valente David, [place], MSN [place] [phone]

## 2019-12-25 DIAGNOSIS — S72001D Fracture of unspecified part of neck of right femur, subsequent encounter for closed fracture with routine healing: Secondary | ICD-10-CM | POA: Diagnosis not present

## 2019-12-25 DIAGNOSIS — M9701XD Periprosthetic fracture around internal prosthetic right hip joint, subsequent encounter: Secondary | ICD-10-CM | POA: Diagnosis not present

## 2019-12-25 DIAGNOSIS — J449 Chronic obstructive pulmonary disease, unspecified: Secondary | ICD-10-CM | POA: Diagnosis not present

## 2019-12-25 DIAGNOSIS — I4891 Unspecified atrial fibrillation: Secondary | ICD-10-CM | POA: Diagnosis not present

## 2019-12-25 DIAGNOSIS — I110 Hypertensive heart disease with heart failure: Secondary | ICD-10-CM | POA: Diagnosis not present

## 2019-12-25 DIAGNOSIS — I509 Heart failure, unspecified: Secondary | ICD-10-CM | POA: Diagnosis not present

## 2019-12-28 DIAGNOSIS — I509 Heart failure, unspecified: Secondary | ICD-10-CM | POA: Diagnosis not present

## 2019-12-28 DIAGNOSIS — S72001D Fracture of unspecified part of neck of right femur, subsequent encounter for closed fracture with routine healing: Secondary | ICD-10-CM | POA: Diagnosis not present

## 2019-12-28 DIAGNOSIS — J449 Chronic obstructive pulmonary disease, unspecified: Secondary | ICD-10-CM | POA: Diagnosis not present

## 2019-12-28 DIAGNOSIS — I110 Hypertensive heart disease with heart failure: Secondary | ICD-10-CM | POA: Diagnosis not present

## 2019-12-28 DIAGNOSIS — M9701XD Periprosthetic fracture around internal prosthetic right hip joint, subsequent encounter: Secondary | ICD-10-CM | POA: Diagnosis not present

## 2019-12-28 DIAGNOSIS — I4891 Unspecified atrial fibrillation: Secondary | ICD-10-CM | POA: Diagnosis not present

## 2019-12-29 DIAGNOSIS — S72001D Fracture of unspecified part of neck of right femur, subsequent encounter for closed fracture with routine healing: Secondary | ICD-10-CM | POA: Diagnosis not present

## 2019-12-29 DIAGNOSIS — I110 Hypertensive heart disease with heart failure: Secondary | ICD-10-CM | POA: Diagnosis not present

## 2019-12-29 DIAGNOSIS — I4891 Unspecified atrial fibrillation: Secondary | ICD-10-CM | POA: Diagnosis not present

## 2019-12-29 DIAGNOSIS — I509 Heart failure, unspecified: Secondary | ICD-10-CM | POA: Diagnosis not present

## 2019-12-29 DIAGNOSIS — M9701XD Periprosthetic fracture around internal prosthetic right hip joint, subsequent encounter: Secondary | ICD-10-CM | POA: Diagnosis not present

## 2019-12-29 DIAGNOSIS — J449 Chronic obstructive pulmonary disease, unspecified: Secondary | ICD-10-CM | POA: Diagnosis not present

## 2019-12-30 DIAGNOSIS — I4891 Unspecified atrial fibrillation: Secondary | ICD-10-CM | POA: Diagnosis not present

## 2019-12-30 DIAGNOSIS — S72001D Fracture of unspecified part of neck of right femur, subsequent encounter for closed fracture with routine healing: Secondary | ICD-10-CM | POA: Diagnosis not present

## 2019-12-30 DIAGNOSIS — I509 Heart failure, unspecified: Secondary | ICD-10-CM | POA: Diagnosis not present

## 2019-12-30 DIAGNOSIS — M9701XD Periprosthetic fracture around internal prosthetic right hip joint, subsequent encounter: Secondary | ICD-10-CM | POA: Diagnosis not present

## 2019-12-30 DIAGNOSIS — I110 Hypertensive heart disease with heart failure: Secondary | ICD-10-CM | POA: Diagnosis not present

## 2019-12-30 DIAGNOSIS — J449 Chronic obstructive pulmonary disease, unspecified: Secondary | ICD-10-CM | POA: Diagnosis not present

## 2020-01-01 DIAGNOSIS — I4891 Unspecified atrial fibrillation: Secondary | ICD-10-CM | POA: Diagnosis not present

## 2020-01-01 DIAGNOSIS — J449 Chronic obstructive pulmonary disease, unspecified: Secondary | ICD-10-CM | POA: Diagnosis not present

## 2020-01-01 DIAGNOSIS — I110 Hypertensive heart disease with heart failure: Secondary | ICD-10-CM | POA: Diagnosis not present

## 2020-01-01 DIAGNOSIS — M9701XD Periprosthetic fracture around internal prosthetic right hip joint, subsequent encounter: Secondary | ICD-10-CM | POA: Diagnosis not present

## 2020-01-01 DIAGNOSIS — S72001D Fracture of unspecified part of neck of right femur, subsequent encounter for closed fracture with routine healing: Secondary | ICD-10-CM | POA: Diagnosis not present

## 2020-01-01 DIAGNOSIS — I509 Heart failure, unspecified: Secondary | ICD-10-CM | POA: Diagnosis not present

## 2020-01-06 DIAGNOSIS — J449 Chronic obstructive pulmonary disease, unspecified: Secondary | ICD-10-CM | POA: Diagnosis not present

## 2020-01-06 DIAGNOSIS — I110 Hypertensive heart disease with heart failure: Secondary | ICD-10-CM | POA: Diagnosis not present

## 2020-01-06 DIAGNOSIS — S72001D Fracture of unspecified part of neck of right femur, subsequent encounter for closed fracture with routine healing: Secondary | ICD-10-CM | POA: Diagnosis not present

## 2020-01-06 DIAGNOSIS — M9701XD Periprosthetic fracture around internal prosthetic right hip joint, subsequent encounter: Secondary | ICD-10-CM | POA: Diagnosis not present

## 2020-01-06 DIAGNOSIS — I509 Heart failure, unspecified: Secondary | ICD-10-CM | POA: Diagnosis not present

## 2020-01-06 DIAGNOSIS — I4891 Unspecified atrial fibrillation: Secondary | ICD-10-CM | POA: Diagnosis not present

## 2020-01-07 DIAGNOSIS — S72001D Fracture of unspecified part of neck of right femur, subsequent encounter for closed fracture with routine healing: Secondary | ICD-10-CM | POA: Diagnosis not present

## 2020-01-07 DIAGNOSIS — J449 Chronic obstructive pulmonary disease, unspecified: Secondary | ICD-10-CM | POA: Diagnosis not present

## 2020-01-07 DIAGNOSIS — I110 Hypertensive heart disease with heart failure: Secondary | ICD-10-CM | POA: Diagnosis not present

## 2020-01-07 DIAGNOSIS — I509 Heart failure, unspecified: Secondary | ICD-10-CM | POA: Diagnosis not present

## 2020-01-07 DIAGNOSIS — I4891 Unspecified atrial fibrillation: Secondary | ICD-10-CM | POA: Diagnosis not present

## 2020-01-07 DIAGNOSIS — M9701XD Periprosthetic fracture around internal prosthetic right hip joint, subsequent encounter: Secondary | ICD-10-CM | POA: Diagnosis not present

## 2020-01-08 DIAGNOSIS — S72001D Fracture of unspecified part of neck of right femur, subsequent encounter for closed fracture with routine healing: Secondary | ICD-10-CM | POA: Diagnosis not present

## 2020-01-08 DIAGNOSIS — I509 Heart failure, unspecified: Secondary | ICD-10-CM | POA: Diagnosis not present

## 2020-01-08 DIAGNOSIS — M9701XD Periprosthetic fracture around internal prosthetic right hip joint, subsequent encounter: Secondary | ICD-10-CM | POA: Diagnosis not present

## 2020-01-08 DIAGNOSIS — I4891 Unspecified atrial fibrillation: Secondary | ICD-10-CM | POA: Diagnosis not present

## 2020-01-08 DIAGNOSIS — J449 Chronic obstructive pulmonary disease, unspecified: Secondary | ICD-10-CM | POA: Diagnosis not present

## 2020-01-08 DIAGNOSIS — I110 Hypertensive heart disease with heart failure: Secondary | ICD-10-CM | POA: Diagnosis not present

## 2020-01-11 DIAGNOSIS — M9701XD Periprosthetic fracture around internal prosthetic right hip joint, subsequent encounter: Secondary | ICD-10-CM | POA: Diagnosis not present

## 2020-01-11 DIAGNOSIS — I4891 Unspecified atrial fibrillation: Secondary | ICD-10-CM | POA: Diagnosis not present

## 2020-01-11 DIAGNOSIS — I509 Heart failure, unspecified: Secondary | ICD-10-CM | POA: Diagnosis not present

## 2020-01-11 DIAGNOSIS — J449 Chronic obstructive pulmonary disease, unspecified: Secondary | ICD-10-CM | POA: Diagnosis not present

## 2020-01-11 DIAGNOSIS — I110 Hypertensive heart disease with heart failure: Secondary | ICD-10-CM | POA: Diagnosis not present

## 2020-01-11 DIAGNOSIS — S72001D Fracture of unspecified part of neck of right femur, subsequent encounter for closed fracture with routine healing: Secondary | ICD-10-CM | POA: Diagnosis not present

## 2020-01-12 ENCOUNTER — Ambulatory Visit (INDEPENDENT_AMBULATORY_CARE_PROVIDER_SITE_OTHER): Payer: Medicare Other

## 2020-01-12 ENCOUNTER — Ambulatory Visit (INDEPENDENT_AMBULATORY_CARE_PROVIDER_SITE_OTHER): Payer: Medicare Other | Admitting: Orthopaedic Surgery

## 2020-01-12 VITALS — BP 139/88 | HR 123 | Ht 66.0 in | Wt 185.0 lb

## 2020-01-12 DIAGNOSIS — Z96641 Presence of right artificial hip joint: Secondary | ICD-10-CM

## 2020-01-12 NOTE — Progress Notes (Signed)
Post-Op Visit Note   Patient: Stephanie Cortez           Date of Birth: 02-09-1934           MRN: [id number] Visit Date: 01/12/2020 PCP: [name], MD   Assessment & Plan: Patient turns post right hip hemiarthroplasty revision from previous noncemented bipolar hemiarthroplasty for femoral neck fracture.  She fracture lesser trochanter had to be revised to longstem cemented stem.  She is walking well states that she does not have any hip pain she is using her walker and gradually increasing her distance daily.  Chief Complaint:  Chief Complaint  Patient presents with  . Right Hip - Routine Post Op   Visit Diagnoses:  1. History of hemiarthroplasty of right hip     Plan: Continue ambulation.  She is happy with the surgical result.  She can follow-up if she has further problems.  Follow-Up Instructions: Return if symptoms worsen or fail to improve.   Orders:  Orders Placed This Encounter  Procedures  . XR HIP UNILAT W OR W/O PELVIS 1V RIGHT   No orders of the defined types were placed in this encounter.   Imaging: No results found.  PMFS History: Patient Active Problem List   Diagnosis Date Noted  . Periprosthetic fracture around internal prosthetic hip joint 11/16/2019  . Periprosthetic fracture around internal prosthetic hip joint, initial encounter   . Chronic obstructive pulmonary disease ([place])   . Essential hypertension   . Chronic anticoagulation   . Acute blood loss anemia   . Transaminitis   . Hypoalbuminemia due to protein-calorie malnutrition ([place])   . Labile blood pressure   . Drug induced constipation   . Postoperative pain   . Subcapital fracture of femur ([place]) 10/30/2019  . Post-operative state   . Prerenal azotemia   . Leukocytosis   . Closed right hip fracture, initial encounter ([place]) 10/27/2019  . Hoarseness 02/19/2018  . Sensorineural hearing loss (SNHL) of both ears 02/19/2018  . Tinnitus, bilateral 02/19/2018  . Cough 09/06/2017  .  Cough variant asthma 08/19/2017  . Acute bronchitis 02/20/2016  . Pulmonary air trapping 01/04/2016  . Encounter for preoperative pulmonary examination 01/04/2016  . NSIP (nonspecific interstitial pneumonia) ([place]) 09/26/2015  . Chronic sinusitis 09/26/2015  . ILD (interstitial lung disease) ([place]) 07/12/2014  . Upper airway cough syndrome 09/23/2013  . Encounter for therapeutic drug monitoring 08/11/2013  . Congestive heart failure ([place]) 07/08/2013  . Coronary artery calcification seen on CAT scan 03/15/2013  . Nodule of left lung 03/15/2013  . Orthostatic lightheadedness 02/09/2013  . Situational mixed anxiety and depressive disorder   . Hyponatremia 04/03/2012  . Diverticulitis 01/01/2012  . GERD with stricture 01/01/2012  . Hypothyroid 11/23/2010  . Edema 10/04/2010  . Bronchiectasis ([place])   . HYPERTENSION, BENIGN 10/22/2008  . A-fib [facility])    Past Medical History:  Diagnosis Date  . Anemia    - Hgb 9.7gm% on 07/13/2008 in [place] -  Hgg 129gm% wiht normal irone levsl and ferritin 10/27/2008 in [place] Recurrent otitis/sinusitis  . Anxiety    chronic BZ prn  . Atrial fibrillation (HCC)    chronic anticoag  . Bronchiectasis    >PFT 07/13/2008 in [place] 1.9L/76%, FVC 2.45L/74, Ratio 79, TLC 121%, DLCO 64%  AE BRonchiectasis - Dec 2010.New Rx:  outpatient - Feb 2011 - Rx outpatient  . CHF (congestive heart failure) ([place])   . COPD (chronic obstructive pulmonary disease) (HCC)    bronchiectasis  .  Cricopharyngeal achalasia   . Depression   . Diverticulosis   . Dyslipidemia   . Eczema   . GERD with stricture   . Glaucoma   . H. pylori infection   . Hypertension   . Hyponatremia    chronic, s/p endo eval 06/2012  . Hypothyroid   . IBS (irritable bowel syndrome)   . Lumbar disc disease   . Neuropathy of both feet   . Segmental colitis ([place])   . Vertigo   . Wears glasses     Family History  Problem Relation Age of Onset  . Hypertension Mother   . Stroke Mother   .  Emphysema Brother   . Other Father        miner's lung  . Cancer Father        Lung  . Colon polyps Sister   . Pancreatic cancer Sister   . Kidney disease Sister   . Atrial fibrillation Other        siblings    Past Surgical History:  Procedure Laterality Date  . BREAST SURGERY     br bx  . CARDIAC CATHETERIZATION  07/01/2013  . CATARACT EXTRACTION     both  . COLONOSCOPY    . ESOPHAGOSCOPY W/ BOTOX INJECTION  12/11/2011   Procedure: ESOPHAGOSCOPY WITH BOTOX INJECTION;  Surgeon: Rozetta Nunnery, MD;  Location: [place] [facility];  Service: ENT;  Laterality: N/A;  esophogoscopy with dilation, botox injection  . FOOT SURGERY  03/14/2011   gastroc slide-rt  . HIP ARTHROPLASTY Right 10/28/2019   Procedure: ARTHROPLASTY BIPOLAR HIP (HEMIARTHROPLASTY);  Surgeon: Marybelle Killings, MD;  [facility];  Service: Orthopedics;  Laterality: Right;  . TOTAL ABDOMINAL HYSTERECTOMY    . TOTAL HIP REVISION Right 11/16/2019   Procedure: TOTAL HIP REVISION BIPOLAR TO CEMENTED BIPOLAR;  Surgeon: Marybelle Killings, MD;  Location: [facility];  Service: Orthopedics;  Laterality: Right;  . WISDOM TOOTH EXTRACTION     Social History   Occupational History  . Occupation: retired Product manager: [employer]  Tobacco Use  . Smoking status: Never Smoker  . Smokeless tobacco: Never Used  Vaping Use  . Vaping Use: Never used  Substance and Sexual Activity  . Alcohol use: No  . Drug use: No  . Sexual activity: Not on file    Comment: Hysterectomy

## 2020-01-13 DIAGNOSIS — M9701XD Periprosthetic fracture around internal prosthetic right hip joint, subsequent encounter: Secondary | ICD-10-CM | POA: Diagnosis not present

## 2020-01-13 DIAGNOSIS — S72001D Fracture of unspecified part of neck of right femur, subsequent encounter for closed fracture with routine healing: Secondary | ICD-10-CM | POA: Diagnosis not present

## 2020-01-13 DIAGNOSIS — J449 Chronic obstructive pulmonary disease, unspecified: Secondary | ICD-10-CM | POA: Diagnosis not present

## 2020-01-13 DIAGNOSIS — I4891 Unspecified atrial fibrillation: Secondary | ICD-10-CM | POA: Diagnosis not present

## 2020-01-13 DIAGNOSIS — I509 Heart failure, unspecified: Secondary | ICD-10-CM | POA: Diagnosis not present

## 2020-01-13 DIAGNOSIS — I110 Hypertensive heart disease with heart failure: Secondary | ICD-10-CM | POA: Diagnosis not present

## 2020-01-14 DIAGNOSIS — I110 Hypertensive heart disease with heart failure: Secondary | ICD-10-CM | POA: Diagnosis not present

## 2020-01-14 DIAGNOSIS — S72001D Fracture of unspecified part of neck of right femur, subsequent encounter for closed fracture with routine healing: Secondary | ICD-10-CM | POA: Diagnosis not present

## 2020-01-14 DIAGNOSIS — J449 Chronic obstructive pulmonary disease, unspecified: Secondary | ICD-10-CM | POA: Diagnosis not present

## 2020-01-14 DIAGNOSIS — M9701XD Periprosthetic fracture around internal prosthetic right hip joint, subsequent encounter: Secondary | ICD-10-CM | POA: Diagnosis not present

## 2020-01-14 DIAGNOSIS — I509 Heart failure, unspecified: Secondary | ICD-10-CM | POA: Diagnosis not present

## 2020-01-14 DIAGNOSIS — I4891 Unspecified atrial fibrillation: Secondary | ICD-10-CM | POA: Diagnosis not present

## 2020-01-19 DIAGNOSIS — J449 Chronic obstructive pulmonary disease, unspecified: Secondary | ICD-10-CM | POA: Diagnosis not present

## 2020-01-19 DIAGNOSIS — M9701XD Periprosthetic fracture around internal prosthetic right hip joint, subsequent encounter: Secondary | ICD-10-CM | POA: Diagnosis not present

## 2020-01-19 DIAGNOSIS — S72001D Fracture of unspecified part of neck of right femur, subsequent encounter for closed fracture with routine healing: Secondary | ICD-10-CM | POA: Diagnosis not present

## 2020-01-19 DIAGNOSIS — I4891 Unspecified atrial fibrillation: Secondary | ICD-10-CM | POA: Diagnosis not present

## 2020-01-19 DIAGNOSIS — I110 Hypertensive heart disease with heart failure: Secondary | ICD-10-CM | POA: Diagnosis not present

## 2020-01-19 DIAGNOSIS — I509 Heart failure, unspecified: Secondary | ICD-10-CM | POA: Diagnosis not present

## 2020-01-20 DIAGNOSIS — I110 Hypertensive heart disease with heart failure: Secondary | ICD-10-CM | POA: Diagnosis not present

## 2020-01-20 DIAGNOSIS — I4891 Unspecified atrial fibrillation: Secondary | ICD-10-CM | POA: Diagnosis not present

## 2020-01-20 DIAGNOSIS — S72001D Fracture of unspecified part of neck of right femur, subsequent encounter for closed fracture with routine healing: Secondary | ICD-10-CM | POA: Diagnosis not present

## 2020-01-20 DIAGNOSIS — M9701XD Periprosthetic fracture around internal prosthetic right hip joint, subsequent encounter: Secondary | ICD-10-CM | POA: Diagnosis not present

## 2020-01-20 DIAGNOSIS — J449 Chronic obstructive pulmonary disease, unspecified: Secondary | ICD-10-CM | POA: Diagnosis not present

## 2020-01-20 DIAGNOSIS — I509 Heart failure, unspecified: Secondary | ICD-10-CM | POA: Diagnosis not present

## 2020-01-23 DIAGNOSIS — Z7901 Long term (current) use of anticoagulants: Secondary | ICD-10-CM | POA: Diagnosis not present

## 2020-01-23 DIAGNOSIS — Z90710 Acquired absence of both cervix and uterus: Secondary | ICD-10-CM | POA: Diagnosis not present

## 2020-01-23 DIAGNOSIS — I110 Hypertensive heart disease with heart failure: Secondary | ICD-10-CM | POA: Diagnosis not present

## 2020-01-23 DIAGNOSIS — Z96641 Presence of right artificial hip joint: Secondary | ICD-10-CM | POA: Diagnosis not present

## 2020-01-23 DIAGNOSIS — S72001D Fracture of unspecified part of neck of right femur, subsequent encounter for closed fracture with routine healing: Secondary | ICD-10-CM | POA: Diagnosis not present

## 2020-01-23 DIAGNOSIS — K219 Gastro-esophageal reflux disease without esophagitis: Secondary | ICD-10-CM | POA: Diagnosis not present

## 2020-01-23 DIAGNOSIS — I4891 Unspecified atrial fibrillation: Secondary | ICD-10-CM | POA: Diagnosis not present

## 2020-01-23 DIAGNOSIS — J449 Chronic obstructive pulmonary disease, unspecified: Secondary | ICD-10-CM | POA: Diagnosis not present

## 2020-01-23 DIAGNOSIS — Z79891 Long term (current) use of opiate analgesic: Secondary | ICD-10-CM | POA: Diagnosis not present

## 2020-01-23 DIAGNOSIS — F329 Major depressive disorder, single episode, unspecified: Secondary | ICD-10-CM | POA: Diagnosis not present

## 2020-01-23 DIAGNOSIS — K220 Achalasia of cardia: Secondary | ICD-10-CM | POA: Diagnosis not present

## 2020-01-23 DIAGNOSIS — E039 Hypothyroidism, unspecified: Secondary | ICD-10-CM | POA: Diagnosis not present

## 2020-01-23 DIAGNOSIS — I509 Heart failure, unspecified: Secondary | ICD-10-CM | POA: Diagnosis not present

## 2020-01-23 DIAGNOSIS — K589 Irritable bowel syndrome without diarrhea: Secondary | ICD-10-CM | POA: Diagnosis not present

## 2020-01-23 DIAGNOSIS — M9701XD Periprosthetic fracture around internal prosthetic right hip joint, subsequent encounter: Secondary | ICD-10-CM | POA: Diagnosis not present

## 2020-01-23 DIAGNOSIS — K5790 Diverticulosis of intestine, part unspecified, without perforation or abscess without bleeding: Secondary | ICD-10-CM | POA: Diagnosis not present

## 2020-01-23 DIAGNOSIS — Z5181 Encounter for therapeutic drug level monitoring: Secondary | ICD-10-CM | POA: Diagnosis not present

## 2020-01-23 DIAGNOSIS — Z9181 History of falling: Secondary | ICD-10-CM | POA: Diagnosis not present

## 2020-01-23 DIAGNOSIS — W19XXXD Unspecified fall, subsequent encounter: Secondary | ICD-10-CM | POA: Diagnosis not present

## 2020-01-25 DIAGNOSIS — S72001D Fracture of unspecified part of neck of right femur, subsequent encounter for closed fracture with routine healing: Secondary | ICD-10-CM | POA: Diagnosis not present

## 2020-01-25 DIAGNOSIS — I4891 Unspecified atrial fibrillation: Secondary | ICD-10-CM | POA: Diagnosis not present

## 2020-01-25 DIAGNOSIS — I509 Heart failure, unspecified: Secondary | ICD-10-CM | POA: Diagnosis not present

## 2020-01-25 DIAGNOSIS — M9701XD Periprosthetic fracture around internal prosthetic right hip joint, subsequent encounter: Secondary | ICD-10-CM | POA: Diagnosis not present

## 2020-01-25 DIAGNOSIS — J449 Chronic obstructive pulmonary disease, unspecified: Secondary | ICD-10-CM | POA: Diagnosis not present

## 2020-01-25 DIAGNOSIS — I110 Hypertensive heart disease with heart failure: Secondary | ICD-10-CM | POA: Diagnosis not present

## 2020-01-26 DIAGNOSIS — I4891 Unspecified atrial fibrillation: Secondary | ICD-10-CM | POA: Diagnosis not present

## 2020-01-26 DIAGNOSIS — M9701XD Periprosthetic fracture around internal prosthetic right hip joint, subsequent encounter: Secondary | ICD-10-CM | POA: Diagnosis not present

## 2020-01-26 DIAGNOSIS — I110 Hypertensive heart disease with heart failure: Secondary | ICD-10-CM | POA: Diagnosis not present

## 2020-01-26 DIAGNOSIS — S72001D Fracture of unspecified part of neck of right femur, subsequent encounter for closed fracture with routine healing: Secondary | ICD-10-CM | POA: Diagnosis not present

## 2020-01-26 DIAGNOSIS — J449 Chronic obstructive pulmonary disease, unspecified: Secondary | ICD-10-CM | POA: Diagnosis not present

## 2020-01-26 DIAGNOSIS — I509 Heart failure, unspecified: Secondary | ICD-10-CM | POA: Diagnosis not present

## 2020-01-29 DIAGNOSIS — S72001D Fracture of unspecified part of neck of right femur, subsequent encounter for closed fracture with routine healing: Secondary | ICD-10-CM | POA: Diagnosis not present

## 2020-01-29 DIAGNOSIS — J449 Chronic obstructive pulmonary disease, unspecified: Secondary | ICD-10-CM | POA: Diagnosis not present

## 2020-01-29 DIAGNOSIS — I509 Heart failure, unspecified: Secondary | ICD-10-CM | POA: Diagnosis not present

## 2020-01-29 DIAGNOSIS — I110 Hypertensive heart disease with heart failure: Secondary | ICD-10-CM | POA: Diagnosis not present

## 2020-01-29 DIAGNOSIS — M9701XD Periprosthetic fracture around internal prosthetic right hip joint, subsequent encounter: Secondary | ICD-10-CM | POA: Diagnosis not present

## 2020-01-29 DIAGNOSIS — I4891 Unspecified atrial fibrillation: Secondary | ICD-10-CM | POA: Diagnosis not present

## 2020-02-01 DIAGNOSIS — S72001D Fracture of unspecified part of neck of right femur, subsequent encounter for closed fracture with routine healing: Secondary | ICD-10-CM | POA: Diagnosis not present

## 2020-02-01 DIAGNOSIS — I110 Hypertensive heart disease with heart failure: Secondary | ICD-10-CM | POA: Diagnosis not present

## 2020-02-01 DIAGNOSIS — J449 Chronic obstructive pulmonary disease, unspecified: Secondary | ICD-10-CM | POA: Diagnosis not present

## 2020-02-01 DIAGNOSIS — I509 Heart failure, unspecified: Secondary | ICD-10-CM | POA: Diagnosis not present

## 2020-02-01 DIAGNOSIS — M9701XD Periprosthetic fracture around internal prosthetic right hip joint, subsequent encounter: Secondary | ICD-10-CM | POA: Diagnosis not present

## 2020-02-01 DIAGNOSIS — I4891 Unspecified atrial fibrillation: Secondary | ICD-10-CM | POA: Diagnosis not present

## 2020-02-03 DIAGNOSIS — I110 Hypertensive heart disease with heart failure: Secondary | ICD-10-CM | POA: Diagnosis not present

## 2020-02-03 DIAGNOSIS — S72001D Fracture of unspecified part of neck of right femur, subsequent encounter for closed fracture with routine healing: Secondary | ICD-10-CM | POA: Diagnosis not present

## 2020-02-03 DIAGNOSIS — I509 Heart failure, unspecified: Secondary | ICD-10-CM | POA: Diagnosis not present

## 2020-02-03 DIAGNOSIS — J449 Chronic obstructive pulmonary disease, unspecified: Secondary | ICD-10-CM | POA: Diagnosis not present

## 2020-02-03 DIAGNOSIS — M9701XD Periprosthetic fracture around internal prosthetic right hip joint, subsequent encounter: Secondary | ICD-10-CM | POA: Diagnosis not present

## 2020-02-03 DIAGNOSIS — I4891 Unspecified atrial fibrillation: Secondary | ICD-10-CM | POA: Diagnosis not present

## 2020-02-08 ENCOUNTER — Emergency Department (HOSPITAL_COMMUNITY): Payer: Medicare Other

## 2020-02-08 ENCOUNTER — Inpatient Hospital Stay (HOSPITAL_COMMUNITY): Payer: Medicare Other | Admitting: Critical Care Medicine

## 2020-02-08 ENCOUNTER — Other Ambulatory Visit: Payer: Self-pay

## 2020-02-08 ENCOUNTER — Inpatient Hospital Stay (HOSPITAL_COMMUNITY)
Admission: EM | Admit: 2020-02-08 | Discharge: 2020-02-17 | DRG: 853 | Disposition: A | Discharge status: Rehabilitation Facility | Payer: Medicare Other | Attending: Internal Medicine | Admitting: Internal Medicine

## 2020-02-08 ENCOUNTER — Encounter (HOSPITAL_COMMUNITY): Payer: Self-pay | Admitting: Pediatrics

## 2020-02-08 ENCOUNTER — Encounter (HOSPITAL_COMMUNITY)
Admission: EM | Disposition: A | Discharge status: Rehabilitation Facility | Payer: Self-pay | Source: Home / Self Care | Attending: Internal Medicine

## 2020-02-08 ENCOUNTER — Inpatient Hospital Stay (HOSPITAL_COMMUNITY): Payer: Medicare Other

## 2020-02-08 DIAGNOSIS — I5032 Chronic diastolic (congestive) heart failure: Secondary | ICD-10-CM | POA: Diagnosis not present

## 2020-02-08 DIAGNOSIS — Z7901 Long term (current) use of anticoagulants: Secondary | ICD-10-CM | POA: Diagnosis not present

## 2020-02-08 DIAGNOSIS — K559 Vascular disorder of intestine, unspecified: Secondary | ICD-10-CM | POA: Diagnosis not present

## 2020-02-08 DIAGNOSIS — R935 Abnormal findings on diagnostic imaging of other abdominal regions, including retroperitoneum: Secondary | ICD-10-CM | POA: Diagnosis not present

## 2020-02-08 DIAGNOSIS — Z96641 Presence of right artificial hip joint: Secondary | ICD-10-CM | POA: Diagnosis present

## 2020-02-08 DIAGNOSIS — R188 Other ascites: Secondary | ICD-10-CM | POA: Diagnosis not present

## 2020-02-08 DIAGNOSIS — K55019 Acute (reversible) ischemia of small intestine, extent unspecified: Secondary | ICD-10-CM | POA: Diagnosis not present

## 2020-02-08 DIAGNOSIS — K660 Peritoneal adhesions (postprocedural) (postinfection): Secondary | ICD-10-CM | POA: Diagnosis present

## 2020-02-08 DIAGNOSIS — K5792 Diverticulitis of intestine, part unspecified, without perforation or abscess without bleeding: Secondary | ICD-10-CM | POA: Diagnosis present

## 2020-02-08 DIAGNOSIS — R0689 Other abnormalities of breathing: Secondary | ICD-10-CM | POA: Diagnosis not present

## 2020-02-08 DIAGNOSIS — K659 Peritonitis, unspecified: Secondary | ICD-10-CM | POA: Diagnosis not present

## 2020-02-08 DIAGNOSIS — Z90710 Acquired absence of both cervix and uterus: Secondary | ICD-10-CM

## 2020-02-08 DIAGNOSIS — J449 Chronic obstructive pulmonary disease, unspecified: Secondary | ICD-10-CM | POA: Diagnosis present

## 2020-02-08 DIAGNOSIS — D62 Acute posthemorrhagic anemia: Secondary | ICD-10-CM | POA: Diagnosis not present

## 2020-02-08 DIAGNOSIS — A419 Sepsis, unspecified organism: Principal | ICD-10-CM | POA: Diagnosis present

## 2020-02-08 DIAGNOSIS — F32A Depression, unspecified: Secondary | ICD-10-CM | POA: Diagnosis present

## 2020-02-08 DIAGNOSIS — E785 Hyperlipidemia, unspecified: Secondary | ICD-10-CM | POA: Diagnosis present

## 2020-02-08 DIAGNOSIS — E039 Hypothyroidism, unspecified: Secondary | ICD-10-CM | POA: Diagnosis present

## 2020-02-08 DIAGNOSIS — Z931 Gastrostomy status: Secondary | ICD-10-CM | POA: Diagnosis not present

## 2020-02-08 DIAGNOSIS — Z800 Family history of malignant neoplasm of digestive organs: Secondary | ICD-10-CM | POA: Diagnosis not present

## 2020-02-08 DIAGNOSIS — R109 Unspecified abdominal pain: Secondary | ICD-10-CM

## 2020-02-08 DIAGNOSIS — K551 Chronic vascular disorders of intestine: Secondary | ICD-10-CM | POA: Diagnosis not present

## 2020-02-08 DIAGNOSIS — Z79899 Other long term (current) drug therapy: Secondary | ICD-10-CM

## 2020-02-08 DIAGNOSIS — R000 Tachycardia, unspecified: Secondary | ICD-10-CM | POA: Diagnosis present

## 2020-02-08 DIAGNOSIS — Z888 Allergy status to other drugs, medicaments and biological substances status: Secondary | ICD-10-CM

## 2020-02-08 DIAGNOSIS — K581 Irritable bowel syndrome with constipation: Secondary | ICD-10-CM | POA: Diagnosis present

## 2020-02-08 DIAGNOSIS — J849 Interstitial pulmonary disease, unspecified: Secondary | ICD-10-CM | POA: Diagnosis present

## 2020-02-08 DIAGNOSIS — S72001D Fracture of unspecified part of neck of right femur, subsequent encounter for closed fracture with routine healing: Secondary | ICD-10-CM | POA: Diagnosis not present

## 2020-02-08 DIAGNOSIS — Z880 Allergy status to penicillin: Secondary | ICD-10-CM | POA: Diagnosis not present

## 2020-02-08 DIAGNOSIS — Z825 Family history of asthma and other chronic lower respiratory diseases: Secondary | ICD-10-CM | POA: Diagnosis not present

## 2020-02-08 DIAGNOSIS — R1084 Generalized abdominal pain: Secondary | ICD-10-CM | POA: Diagnosis not present

## 2020-02-08 DIAGNOSIS — H409 Unspecified glaucoma: Secondary | ICD-10-CM | POA: Diagnosis present

## 2020-02-08 DIAGNOSIS — I499 Cardiac arrhythmia, unspecified: Secondary | ICD-10-CM | POA: Diagnosis not present

## 2020-02-08 DIAGNOSIS — E871 Hypo-osmolality and hyponatremia: Secondary | ICD-10-CM | POA: Diagnosis present

## 2020-02-08 DIAGNOSIS — Z95828 Presence of other vascular implants and grafts: Secondary | ICD-10-CM

## 2020-02-08 DIAGNOSIS — K55029 Acute infarction of small intestine, extent unspecified: Secondary | ICD-10-CM | POA: Diagnosis not present

## 2020-02-08 DIAGNOSIS — I700 Atherosclerosis of aorta: Secondary | ICD-10-CM | POA: Diagnosis not present

## 2020-02-08 DIAGNOSIS — K220 Achalasia of cardia: Secondary | ICD-10-CM | POA: Diagnosis not present

## 2020-02-08 DIAGNOSIS — R791 Abnormal coagulation profile: Secondary | ICD-10-CM | POA: Diagnosis not present

## 2020-02-08 DIAGNOSIS — E872 Acidosis: Secondary | ICD-10-CM | POA: Diagnosis present

## 2020-02-08 DIAGNOSIS — I4821 Permanent atrial fibrillation: Secondary | ICD-10-CM | POA: Diagnosis not present

## 2020-02-08 DIAGNOSIS — R52 Pain, unspecified: Secondary | ICD-10-CM | POA: Diagnosis not present

## 2020-02-08 DIAGNOSIS — I110 Hypertensive heart disease with heart failure: Secondary | ICD-10-CM | POA: Diagnosis present

## 2020-02-08 DIAGNOSIS — F419 Anxiety disorder, unspecified: Secondary | ICD-10-CM | POA: Diagnosis present

## 2020-02-08 DIAGNOSIS — Z8719 Personal history of other diseases of the digestive system: Secondary | ICD-10-CM | POA: Diagnosis not present

## 2020-02-08 DIAGNOSIS — Z79890 Hormone replacement therapy (postmenopausal): Secondary | ICD-10-CM

## 2020-02-08 DIAGNOSIS — E441 Mild protein-calorie malnutrition: Secondary | ICD-10-CM | POA: Diagnosis not present

## 2020-02-08 DIAGNOSIS — Z8249 Family history of ischemic heart disease and other diseases of the circulatory system: Secondary | ICD-10-CM | POA: Diagnosis not present

## 2020-02-08 DIAGNOSIS — Z7951 Long term (current) use of inhaled steroids: Secondary | ICD-10-CM | POA: Diagnosis not present

## 2020-02-08 DIAGNOSIS — Z881 Allergy status to other antibiotic agents status: Secondary | ICD-10-CM | POA: Diagnosis not present

## 2020-02-08 DIAGNOSIS — K219 Gastro-esophageal reflux disease without esophagitis: Secondary | ICD-10-CM | POA: Diagnosis not present

## 2020-02-08 DIAGNOSIS — R195 Other fecal abnormalities: Secondary | ICD-10-CM | POA: Diagnosis not present

## 2020-02-08 DIAGNOSIS — I10 Essential (primary) hypertension: Secondary | ICD-10-CM | POA: Diagnosis not present

## 2020-02-08 DIAGNOSIS — Z20822 Contact with and (suspected) exposure to covid-19: Secondary | ICD-10-CM | POA: Diagnosis not present

## 2020-02-08 DIAGNOSIS — Z6826 Body mass index (BMI) 26.0-26.9, adult: Secondary | ICD-10-CM

## 2020-02-08 DIAGNOSIS — E46 Unspecified protein-calorie malnutrition: Secondary | ICD-10-CM | POA: Diagnosis not present

## 2020-02-08 DIAGNOSIS — I728 Aneurysm of other specified arteries: Secondary | ICD-10-CM | POA: Diagnosis not present

## 2020-02-08 DIAGNOSIS — E876 Hypokalemia: Secondary | ICD-10-CM | POA: Diagnosis present

## 2020-02-08 DIAGNOSIS — I4891 Unspecified atrial fibrillation: Secondary | ICD-10-CM | POA: Diagnosis not present

## 2020-02-08 DIAGNOSIS — E440 Moderate protein-calorie malnutrition: Secondary | ICD-10-CM | POA: Diagnosis present

## 2020-02-08 DIAGNOSIS — D473 Essential (hemorrhagic) thrombocythemia: Secondary | ICD-10-CM | POA: Diagnosis present

## 2020-02-08 DIAGNOSIS — Z823 Family history of stroke: Secondary | ICD-10-CM | POA: Diagnosis not present

## 2020-02-08 DIAGNOSIS — R21 Rash and other nonspecific skin eruption: Secondary | ICD-10-CM | POA: Diagnosis not present

## 2020-02-08 DIAGNOSIS — E8809 Other disorders of plasma-protein metabolism, not elsewhere classified: Secondary | ICD-10-CM | POA: Diagnosis not present

## 2020-02-08 DIAGNOSIS — G629 Polyneuropathy, unspecified: Secondary | ICD-10-CM | POA: Diagnosis present

## 2020-02-08 DIAGNOSIS — F329 Major depressive disorder, single episode, unspecified: Secondary | ICD-10-CM | POA: Diagnosis present

## 2020-02-08 DIAGNOSIS — Z841 Family history of disorders of kidney and ureter: Secondary | ICD-10-CM | POA: Diagnosis not present

## 2020-02-08 DIAGNOSIS — G8918 Other acute postprocedural pain: Secondary | ICD-10-CM | POA: Diagnosis not present

## 2020-02-08 DIAGNOSIS — I509 Heart failure, unspecified: Secondary | ICD-10-CM | POA: Diagnosis not present

## 2020-02-08 DIAGNOSIS — D75839 Thrombocytosis, unspecified: Secondary | ICD-10-CM | POA: Diagnosis present

## 2020-02-08 DIAGNOSIS — I5042 Chronic combined systolic (congestive) and diastolic (congestive) heart failure: Secondary | ICD-10-CM | POA: Diagnosis present

## 2020-02-08 DIAGNOSIS — R948 Abnormal results of function studies of other organs and systems: Secondary | ICD-10-CM | POA: Diagnosis present

## 2020-02-08 DIAGNOSIS — Z8371 Family history of colonic polyps: Secondary | ICD-10-CM | POA: Diagnosis not present

## 2020-02-08 DIAGNOSIS — R5381 Other malaise: Secondary | ICD-10-CM | POA: Diagnosis not present

## 2020-02-08 DIAGNOSIS — M9701XD Periprosthetic fracture around internal prosthetic right hip joint, subsequent encounter: Secondary | ICD-10-CM | POA: Diagnosis not present

## 2020-02-08 DIAGNOSIS — R0902 Hypoxemia: Secondary | ICD-10-CM | POA: Diagnosis not present

## 2020-02-08 DIAGNOSIS — K55059 Acute (reversible) ischemia of intestine, part and extent unspecified: Secondary | ICD-10-CM | POA: Diagnosis not present

## 2020-02-08 HISTORY — PX: LYSIS OF ADHESION: SHX5961

## 2020-02-08 HISTORY — DX: Vascular disorder of intestine, unspecified: K55.9

## 2020-02-08 HISTORY — PX: GASTROSTOMY: SHX5249

## 2020-02-08 HISTORY — PX: BOWEL RESECTION: SHX1257

## 2020-02-08 HISTORY — PX: LAPAROTOMY: SHX154

## 2020-02-08 LAB — CBC
HCT: 44.8 % (ref 36.0–46.0)
Hemoglobin: 13.9 g/dL (ref 12.0–15.0)
MCH: 28.5 pg (ref 26.0–34.0)
MCHC: 31 g/dL (ref 30.0–36.0)
MCV: 92 fL (ref 80.0–100.0)
Platelets: 513 10*3/uL — ABNORMAL HIGH (ref 150–400)
RBC: 4.87 MIL/uL (ref 3.87–5.11)
RDW: 15 % (ref 11.5–15.5)
WBC: 18.5 10*3/uL — ABNORMAL HIGH (ref 4.0–10.5)
nRBC: 0 % (ref 0.0–0.2)

## 2020-02-08 LAB — POCT I-STAT 7, (LYTES, BLD GAS, ICA,H+H)
Acid-base deficit: 2 mmol/L (ref 0.0–2.0)
Bicarbonate: 23 mmol/L (ref 20.0–28.0)
Calcium, Ion: 1.17 mmol/L (ref 1.15–1.40)
HCT: 40 % (ref 36.0–46.0)
Hemoglobin: 13.6 g/dL (ref 12.0–15.0)
O2 Saturation: 100 %
Patient temperature: 36
Potassium: 4 mmol/L (ref 3.5–5.1)
Sodium: 131 mmol/L — ABNORMAL LOW (ref 135–145)
TCO2: 24 mmol/L (ref 22–32)
pCO2 arterial: 37.3 mmHg (ref 32.0–48.0)
pH, Arterial: 7.393 (ref 7.350–7.450)
pO2, Arterial: 362 mmHg — ABNORMAL HIGH (ref 83.0–108.0)

## 2020-02-08 LAB — COMPREHENSIVE METABOLIC PANEL
ALT: 11 U/L (ref 0–44)
AST: 20 U/L (ref 15–41)
Albumin: 2.9 g/dL — ABNORMAL LOW (ref 3.5–5.0)
Alkaline Phosphatase: 114 U/L (ref 38–126)
Anion gap: 17 — ABNORMAL HIGH (ref 5–15)
BUN: 7 mg/dL — ABNORMAL LOW (ref 8–23)
CO2: 20 mmol/L — ABNORMAL LOW (ref 22–32)
Calcium: 9 mg/dL (ref 8.9–10.3)
Chloride: 95 mmol/L — ABNORMAL LOW (ref 98–111)
Creatinine, Ser: 0.86 mg/dL (ref 0.44–1.00)
GFR calc Af Amer: >60 mL/min (ref >60)
GFR calc non Af Amer: >60 mL/min (ref >60)
Glucose, Bld: 175 mg/dL — ABNORMAL HIGH (ref 70–99)
Potassium: 3.8 mmol/L (ref 3.5–5.1)
Sodium: 132 mmol/L — ABNORMAL LOW (ref 135–145)
Total Bilirubin: 0.8 mg/dL (ref 0.3–1.2)
Total Protein: 6.8 g/dL (ref 6.5–8.1)

## 2020-02-08 LAB — POCT I-STAT, CHEM 8
BUN: 11 mg/dL (ref 8–23)
Calcium, Ion: 1.16 mmol/L (ref 1.15–1.40)
Chloride: 97 mmol/L — ABNORMAL LOW (ref 98–111)
Creatinine, Ser: 0.6 mg/dL (ref 0.44–1.00)
Glucose, Bld: 148 mg/dL — ABNORMAL HIGH (ref 70–99)
HCT: 42 % (ref 36.0–46.0)
Hemoglobin: 14.3 g/dL (ref 12.0–15.0)
Potassium: 4 mmol/L (ref 3.5–5.1)
Sodium: 132 mmol/L — ABNORMAL LOW (ref 135–145)
TCO2: 24 mmol/L (ref 22–32)

## 2020-02-08 LAB — PROTIME-INR
INR: 3.3 — ABNORMAL HIGH (ref 0.8–1.2)
Prothrombin Time: 32.2 seconds — ABNORMAL HIGH (ref 11.4–15.2)

## 2020-02-08 LAB — SARS CORONAVIRUS 2 BY RT PCR (HOSPITAL ORDER, PERFORMED IN ~~LOC~~ HOSPITAL LAB): SARS Coronavirus 2: NEGATIVE

## 2020-02-08 LAB — PREPARE RBC (CROSSMATCH)

## 2020-02-08 LAB — LACTIC ACID, PLASMA
Lactic Acid, Venous: 2.4 mmol/L (ref 0.5–1.9)
Lactic Acid, Venous: 2.4 mmol/L (ref 0.5–1.9)

## 2020-02-08 LAB — POC OCCULT BLOOD, ED: Fecal Occult Bld: POSITIVE — AB

## 2020-02-08 SURGERY — LAPAROTOMY, EXPLORATORY
Anesthesia: General | Site: Abdomen

## 2020-02-08 MED ORDER — BUPIVACAINE LIPOSOME 1.3 % IJ SUSP
20.0000 mL | INTRAMUSCULAR | Status: DC
  Filled 2020-02-08: qty 20

## 2020-02-08 MED ORDER — DILTIAZEM HCL-DEXTROSE 125-5 MG/125ML-% IV SOLN (PREMIX)
5.0000 mg/h | INTRAVENOUS | Status: DC
  Administered 2020-02-09 – 2020-02-10 (×3): 10 mg/h via INTRAVENOUS
  Administered 2020-02-11: 5 mg/h via INTRAVENOUS
  Filled 2020-02-08 (×5): qty 125

## 2020-02-08 MED ORDER — ESMOLOL HCL 100 MG/10ML IV SOLN
INTRAVENOUS | Status: DC | PRN
  Administered 2020-02-08: 20 mg via INTRAVENOUS
  Administered 2020-02-08: 10 mg via INTRAVENOUS
  Administered 2020-02-08: 30 mg via INTRAVENOUS

## 2020-02-08 MED ORDER — SODIUM CHLORIDE 0.9 % IV SOLN
10.0000 mL/h | Freq: Once | INTRAVENOUS | Status: AC
  Administered 2020-02-08: 10 mL/h via INTRAVENOUS

## 2020-02-08 MED ORDER — BUPIVACAINE LIPOSOME 1.3 % IJ SUSP
INTRAMUSCULAR | Status: DC | PRN
  Administered 2020-02-08: 20 mL

## 2020-02-08 MED ORDER — DILTIAZEM HCL-DEXTROSE 125-5 MG/125ML-% IV SOLN (PREMIX)
INTRAVENOUS | Status: DC | PRN
  Administered 2020-02-08: 5 mg via INTRAVENOUS
  Administered 2020-02-08: 10 mg via INTRAVENOUS

## 2020-02-08 MED ORDER — SODIUM CHLORIDE 0.9 % IV SOLN
50.0000 ug/h | INTRAVENOUS | Status: DC
  Filled 2020-02-08: qty 1

## 2020-02-08 MED ORDER — ALBUMIN HUMAN 5 % IV SOLN: INTRAVENOUS | Status: DC | PRN

## 2020-02-08 MED ORDER — PROTHROMBIN COMPLEX CONC HUMAN 500 UNITS IV KIT
1625.0000 [IU] | PACK | Status: AC
  Administered 2020-02-08: 1625 [IU] via INTRAVENOUS
  Filled 2020-02-08: qty 1625

## 2020-02-08 MED ORDER — FENTANYL CITRATE (PF) 250 MCG/5ML IJ SOLN
INTRAMUSCULAR | Status: AC
  Filled 2020-02-08: qty 5

## 2020-02-08 MED ORDER — PHENYLEPHRINE HCL-NACL 10-0.9 MG/250ML-% IV SOLN
INTRAVENOUS | Status: DC | PRN
  Administered 2020-02-08: 50 ug/min via INTRAVENOUS

## 2020-02-08 MED ORDER — SUCCINYLCHOLINE CHLORIDE 20 MG/ML IJ SOLN
INTRAMUSCULAR | Status: DC | PRN
  Administered 2020-02-08: 100 mg via INTRAVENOUS

## 2020-02-08 MED ORDER — MORPHINE SULFATE (PF) 4 MG/ML IV SOLN
4.0000 mg | Freq: Once | INTRAVENOUS | Status: AC
  Administered 2020-02-08: 4 mg via INTRAVENOUS
  Filled 2020-02-08: qty 1

## 2020-02-08 MED ORDER — SODIUM CHLORIDE 0.9 % IV SOLN: 2.0000 g | INTRAVENOUS | Status: DC

## 2020-02-08 MED ORDER — ACETAMINOPHEN 325 MG PO TABS
650.0000 mg | ORAL_TABLET | Freq: Four times a day (QID) | ORAL | Status: DC | PRN
  Administered 2020-02-10 – 2020-02-17 (×5): 650 mg via ORAL
  Filled 2020-02-08 (×5): qty 2

## 2020-02-08 MED ORDER — LACTATED RINGERS IV SOLN: INTRAVENOUS | Status: DC | PRN

## 2020-02-08 MED ORDER — ONDANSETRON HCL 4 MG/2ML IJ SOLN: 4.0000 mg | Freq: Four times a day (QID) | INTRAMUSCULAR | Status: DC | PRN

## 2020-02-08 MED ORDER — LIDOCAINE HCL (CARDIAC) PF 100 MG/5ML IV SOSY
PREFILLED_SYRINGE | INTRAVENOUS | Status: DC | PRN
  Administered 2020-02-08: 60 mg via INTRAVENOUS

## 2020-02-08 MED ORDER — IOHEXOL 350 MG/ML SOLN
100.0000 mL | Freq: Once | INTRAVENOUS | Status: AC | PRN
  Administered 2020-02-08: 100 mL via INTRAVENOUS

## 2020-02-08 MED ORDER — FENTANYL CITRATE (PF) 100 MCG/2ML IJ SOLN
INTRAMUSCULAR | Status: DC | PRN
  Administered 2020-02-08 (×3): 50 ug via INTRAVENOUS
  Administered 2020-02-08: 25 ug via INTRAVENOUS

## 2020-02-08 MED ORDER — ONDANSETRON HCL 4 MG PO TABS: 4.0000 mg | ORAL_TABLET | Freq: Four times a day (QID) | ORAL | Status: DC | PRN

## 2020-02-08 MED ORDER — ONDANSETRON HCL 4 MG/2ML IJ SOLN
INTRAMUSCULAR | Status: DC | PRN
  Administered 2020-02-08: 4 mg via INTRAVENOUS

## 2020-02-08 MED ORDER — ROCURONIUM BROMIDE 100 MG/10ML IV SOLN
INTRAVENOUS | Status: DC | PRN
  Administered 2020-02-08: 5 mg via INTRAVENOUS
  Administered 2020-02-08: 40 mg via INTRAVENOUS

## 2020-02-08 MED ORDER — DILTIAZEM LOAD VIA INFUSION
INTRAVENOUS | Status: DC | PRN
Start: 2020-02-08 — End: 2020-02-08

## 2020-02-08 MED ORDER — MORPHINE SULFATE (PF) 2 MG/ML IV SOLN
2.0000 mg | INTRAVENOUS | Status: DC | PRN
  Administered 2020-02-09 – 2020-02-10 (×5): 2 mg via INTRAVENOUS
  Administered 2020-02-11: 4 mg via INTRAVENOUS
  Administered 2020-02-11 (×2): 2 mg via INTRAVENOUS
  Administered 2020-02-12 (×2): 4 mg via INTRAVENOUS
  Administered 2020-02-12 – 2020-02-13 (×3): 2 mg via INTRAVENOUS
  Administered 2020-02-13 (×2): 4 mg via INTRAVENOUS
  Administered 2020-02-14: 2 mg via INTRAVENOUS
  Filled 2020-02-08 (×3): qty 1
  Filled 2020-02-08: qty 2
  Filled 2020-02-08: qty 1
  Filled 2020-02-08 (×2): qty 2
  Filled 2020-02-08 (×4): qty 1
  Filled 2020-02-08 (×2): qty 2
  Filled 2020-02-08 (×4): qty 1

## 2020-02-08 MED ORDER — DEXAMETHASONE SODIUM PHOSPHATE 4 MG/ML IJ SOLN
INTRAMUSCULAR | Status: DC | PRN
  Administered 2020-02-08: 4 mg via INTRAVENOUS

## 2020-02-08 MED ORDER — ONDANSETRON HCL 4 MG/2ML IJ SOLN
4.0000 mg | Freq: Once | INTRAMUSCULAR | Status: AC
  Administered 2020-02-08: 4 mg via INTRAVENOUS
  Filled 2020-02-08: qty 2

## 2020-02-08 MED ORDER — SODIUM CHLORIDE 0.9 % IV SOLN: INTRAVENOUS | Status: DC

## 2020-02-08 MED ORDER — ACETAMINOPHEN 650 MG RE SUPP: 650.0000 mg | Freq: Four times a day (QID) | RECTAL | Status: DC | PRN

## 2020-02-08 MED ORDER — LACTATED RINGERS IV BOLUS
1000.0000 mL | Freq: Once | INTRAVENOUS | Status: AC
  Administered 2020-02-08: 1000 mL via INTRAVENOUS

## 2020-02-08 MED ORDER — PROPOFOL 10 MG/ML IV BOLUS
INTRAVENOUS | Status: DC | PRN
  Administered 2020-02-08: 70 mg via INTRAVENOUS

## 2020-02-08 MED ORDER — MOMETASONE FURO-FORMOTEROL FUM 100-5 MCG/ACT IN AERO
2.0000 | INHALATION_SPRAY | Freq: Two times a day (BID) | RESPIRATORY_TRACT | Status: DC
  Administered 2020-02-09 – 2020-02-17 (×16): 2 via RESPIRATORY_TRACT
  Filled 2020-02-08: qty 8.8

## 2020-02-08 MED ORDER — PIPERACILLIN-TAZOBACTAM IN DEX 2-0.25 GM/50ML IV SOLN: 2.2500 g | Freq: Once | INTRAVENOUS | Status: DC

## 2020-02-08 MED ORDER — PHENYLEPHRINE 40 MCG/ML (10ML) SYRINGE FOR IV PUSH (FOR BLOOD PRESSURE SUPPORT)
PREFILLED_SYRINGE | INTRAVENOUS | Status: DC | PRN
  Administered 2020-02-08: 80 ug via INTRAVENOUS

## 2020-02-08 MED ORDER — FAMOTIDINE IN NACL 20-0.9 MG/50ML-% IV SOLN
20.0000 mg | INTRAVENOUS | Status: DC
  Administered 2020-02-09 – 2020-02-16 (×8): 20 mg via INTRAVENOUS
  Filled 2020-02-08 (×9): qty 50

## 2020-02-08 MED ORDER — VITAMIN K1 10 MG/ML IJ SOLN
5.0000 mg | Freq: Once | INTRAVENOUS | Status: AC
  Administered 2020-02-08: 5 mg via INTRAVENOUS
  Filled 2020-02-08: qty 0.5

## 2020-02-08 MED ORDER — PROPOFOL 10 MG/ML IV BOLUS
INTRAVENOUS | Status: AC
  Filled 2020-02-08: qty 20

## 2020-02-08 MED ORDER — SODIUM CHLORIDE 0.9 % IV SOLN
1.0000 g | Freq: Three times a day (TID) | INTRAVENOUS | Status: DC
  Administered 2020-02-08 – 2020-02-12 (×12): 1 g via INTRAVENOUS
  Filled 2020-02-08 (×15): qty 1

## 2020-02-08 MED ORDER — OXYCODONE HCL 5 MG PO TABS: 5.0000 mg | ORAL_TABLET | ORAL | Status: DC | PRN

## 2020-02-08 MED ORDER — SUGAMMADEX SODIUM 200 MG/2ML IV SOLN
INTRAVENOUS | Status: DC | PRN
  Administered 2020-02-08: 200 mg via INTRAVENOUS

## 2020-02-08 MED ORDER — MORPHINE SULFATE (PF) 2 MG/ML IV SOLN: 2.0000 mg | INTRAVENOUS | Status: DC | PRN

## 2020-02-08 MED ORDER — LACTATED RINGERS IV SOLN: INTRAVENOUS | Status: DC

## 2020-02-08 MED ORDER — 0.9 % SODIUM CHLORIDE (POUR BTL) OPTIME
TOPICAL | Status: DC | PRN
  Administered 2020-02-08 (×3): 1000 mL

## 2020-02-08 SURGICAL SUPPLY — 50 items
APL PRP STRL LF DISP 70% ISPRP (MISCELLANEOUS) ×1
BAG DRN RND TRDRP ANRFLXCHMBR (UROLOGICAL SUPPLIES) ×1
BAG URINE DRAIN 2000ML AR STRL (UROLOGICAL SUPPLIES) ×1 IMPLANT
CANISTER SUCT 3000ML PPV (MISCELLANEOUS) ×2 IMPLANT
CHLORAPREP W/TINT 26 (MISCELLANEOUS) ×2 IMPLANT
COVER SURGICAL LIGHT HANDLE (MISCELLANEOUS) ×2 IMPLANT
COVER WAND RF STERILE (DRAPES) ×2 IMPLANT
DRAPE LAPAROSCOPIC ABDOMINAL (DRAPES) ×2 IMPLANT
DRAPE WARM FLUID 44X44 (DRAPES) ×2 IMPLANT
DRSG COVADERM 4X14 (GAUZE/BANDAGES/DRESSINGS) ×1 IMPLANT
DRSG OPSITE POSTOP 4X10 (GAUZE/BANDAGES/DRESSINGS) IMPLANT
DRSG OPSITE POSTOP 4X8 (GAUZE/BANDAGES/DRESSINGS) IMPLANT
ELECT BLADE 6.5 EXT (BLADE) ×1 IMPLANT
ELECT CAUTERY BLADE 6.4 (BLADE) ×2 IMPLANT
ELECT REM PT RETURN 9FT ADLT (ELECTROSURGICAL) ×2
ELECTRODE REM PT RTRN 9FT ADLT (ELECTROSURGICAL) ×1 IMPLANT
GLOVE BIO SURGEON STRL SZ7 (GLOVE) ×4 IMPLANT
GLOVE BIOGEL PI IND STRL 7.5 (GLOVE) ×1 IMPLANT
GLOVE BIOGEL PI INDICATOR 7.5 (GLOVE) ×1
GOWN STRL REUS W/ TWL LRG LVL3 (GOWN DISPOSABLE) ×3 IMPLANT
GOWN STRL REUS W/TWL LRG LVL3 (GOWN DISPOSABLE) ×4
HANDLE SUCTION POOLE (INSTRUMENTS) ×1 IMPLANT
KIT BASIN OR (CUSTOM PROCEDURE TRAY) ×2 IMPLANT
KIT TURNOVER KIT B (KITS) ×2 IMPLANT
LIGASURE IMPACT 36 18CM CVD LR (INSTRUMENTS) ×1 IMPLANT
NS IRRIG 1000ML POUR BTL (IV SOLUTION) ×4 IMPLANT
PACK GENERAL/GYN (CUSTOM PROCEDURE TRAY) ×2 IMPLANT
PAD ARMBOARD 7.5X6 YLW CONV (MISCELLANEOUS) ×3 IMPLANT
PENCIL SMOKE EVACUATOR (MISCELLANEOUS) ×2 IMPLANT
RELOAD PROXIMATE 75MM BLUE (ENDOMECHANICALS) ×4 IMPLANT
RELOAD STAPLE 75 3.8 BLU REG (ENDOMECHANICALS) IMPLANT
SPECIMEN JAR LARGE (MISCELLANEOUS) IMPLANT
SPONGE LAP 18X18 RF (DISPOSABLE) ×1 IMPLANT
STAPLER GUN LINEAR PROX 60 (STAPLE) ×1 IMPLANT
STAPLER PROXIMATE 75MM BLUE (STAPLE) ×1 IMPLANT
STAPLER VISISTAT 35W (STAPLE) ×2 IMPLANT
SUCTION POOLE HANDLE (INSTRUMENTS) ×2
SUT ETHILON 2 0 FS 18 (SUTURE) ×1 IMPLANT
SUT NOVA NAB DX-16 0-1 5-0 T12 (SUTURE) ×3 IMPLANT
SUT PDS AB 1 TP1 96 (SUTURE) ×2 IMPLANT
SUT SILK 2 0 SH CR/8 (SUTURE) ×2 IMPLANT
SUT SILK 2 0 TIES 10X30 (SUTURE) ×2 IMPLANT
SUT SILK 3 0 SH CR/8 (SUTURE) ×2 IMPLANT
SUT SILK 3 0 TIES 10X30 (SUTURE) ×2 IMPLANT
SUT VIC AB 3-0 SH 18 (SUTURE) IMPLANT
TOWEL GREEN STERILE (TOWEL DISPOSABLE) ×2 IMPLANT
TOWEL GREEN STERILE FF (TOWEL DISPOSABLE) ×2 IMPLANT
TRAY FOLEY MTR SLVR 14FR STAT (SET/KITS/TRAYS/PACK) ×1 IMPLANT
TUBE GASTROSTOMY 18F (CATHETERS) ×1 IMPLANT
YANKAUER SUCT BULB TIP NO VENT (SUCTIONS) ×2 IMPLANT

## 2020-02-08 NOTE — ED Triage Notes (Signed)
Arrived via EMS; c/o abdominal pain + tenderness. Nausea and coffee ground like emesis. SP total hip replacement last April. EMS endorsed concern for sepsis. NS 500 bolus and Zofran 4 mg given en route.

## 2020-02-08 NOTE — Consult Note (Signed)
NAME:  Stephanie Cortez, MRN:  [id number], DOB:  02-10-1934, LOS: 0 ADMISSION DATE:  02/08/2020, CONSULTATION DATE:  02/08/20 REFERRING MD:  Donne [name] - CCS , CHIEF COMPLAINT:  Ischemic bowel, concern for possible decompensation   Brief History   86 yo hx ILD presents with ischemic bowel. Going to OR urgently with CCS. Stable at present but with risk for decompensation, PCCM asked to consult   History of present illness   86 yo PMH ILD Afib on coumadin, colitis, diverticulitis, IBS, cricopharyngeal achalasia, recent R hip fx s/p replacement 10/2019, who presents to ED 02/08/20 with 4 day hx worsening abdominal pain, coffee ground emesis. Pain 10/10, sore in character.  Imaging in ED reveals small bowel ischmia 2/2/ bowel adhesions and torsions. CCS plans for urgent OR INR 3.3, receiving Vit K and KCentra prior to OR. Receiving IVF and PRN morphine for pain, zofran and rocephin in ED. Admitted to [facility]. CCS has requested PCCM to also consult with risk for decompensation post op.  Past Medical History    Significant Hospital Events   8/9 OR with CCS   Consults:  CCS PCCM  Procedures:    Significant Diagnostic Tests:  CTA a/p>small bowel ischemia   Micro Data:  SARS Cov2> neg   Antimicrobials:  8/9 rocephin>  Interim history/subjective:  Seen in ED as patient is being taken to OR.  HDS and stable on 2LNC   Objective   Blood pressure 117/76, pulse (!) 103, temperature (!) 97.5 F (36.4 C), temperature source Oral, resp. rate (!) 27, height 5\' 6"  (1.676 m), weight 74.8 kg, SpO2 100 %.        Intake/Output Summary (Last 24 hours) at 02/08/2020 1704 Last data filed at 02/08/2020 1639 Gross per 24 hour  Intake 0 ml  Output --  Net 0 ml   Filed Weights   02/08/20 1143  Weight: 74.8 kg    Examination: General: Chronically ill appearing elderly F supine in NAD HENT: NCAT. Scant dried blood on lips. Trachea midline Lungs: Mildly coarse breath sounds. Symmetrical chest expansion.  No accessory muscle use Cardiovascular: irir s1s2 1+ pulses Abdomen: Round tender no guarding  Extremities: Symmetrical bulk and tone. No obvious deformity. No cyanosis or clubbing Neuro: AAO following commands. PERRL GU: adult diaper  Skin: frail with + turgor  Resolved Hospital Problem list     Assessment & Plan:   Small bowel ischemia Sepsis in setting of bowel ischemia Mildly elevated LA  P -OR with CCS 8/9 for exlap, resection  -abx per primary -at risk for intra op/post op decompensation   ILD -followed by Dr. Chase Caller -risk of post op resp failure -- discussed with pt, family at bediside  -2LNC at baseline  -Symbicort   Afib -cardiac monitoring, holding coumadin   Coagulopathy with supratherapeutic INR -on coumadin for Afib, hold  -pre-op KCentra   Hx HTN -PRN hydral; holding pt home antihypertensives as pt is NPO   Hypothyroidism -anticipate likely resume home synthroid in AM   S/p R hip replacement -PT    Best practice:  Diet: NPO Pain/Anxiety/Delirium protocol (if indicated): na VAP protocol (if indicated): na DVT prophylaxis: SCD  GI prophylaxis: per primary Glucose control: per primary Mobility: per primary Code Status: Full Family Communication: I spoke with family at bedside in ED  Disposition: Initially SDU with [facility]. Post op placement pending clinical status post operatively   Labs   CBC: Recent Labs  Lab 02/08/20 1150  WBC 18.5*  HGB 13.9  HCT 44.8  MCV 92.0  PLT 513*    Basic Metabolic Panel: Recent Labs  Lab 02/08/20 1150  NA 132*  K 3.8  CL 95*  CO2 20*  GLUCOSE 175*  BUN 7*  CREATININE 0.86  CALCIUM 9.0   GFR: Estimated Creatinine Clearance: 50.4 mL/min (by C-G formula based on SCr of 0.86 mg/dL). Recent Labs  Lab 02/08/20 1150 02/08/20 1250 02/08/20 1449  WBC 18.5*  --   --   LATICACIDVEN  --  2.4* 2.4*    Liver Function Tests: Recent Labs  Lab 02/08/20 1150  AST 20  ALT 11  ALKPHOS 114  BILITOT  0.8  PROT 6.8  ALBUMIN 2.9*   No results for input(s): LIPASE, AMYLASE in the last 168 hours. No results for input(s): AMMONIA in the last 168 hours.  ABG    Component Value Date/Time   TCO2 23 12/11/2011 0707     Coagulation Profile: Recent Labs  Lab 02/08/20 1246  INR 3.3*    Cardiac Enzymes: No results for input(s): CKTOTAL, CKMB, CKMBINDEX, TROPONINI in the last 168 hours.  HbA1C: Hgb A1c MFr Bld  Date/Time Value Ref Range Status  10/07/2012 12:00 AM 5.4 4.0 - 6.0 % Final  04/06/2011 12:14 PM 5.7 4.6 - 6.5 % Final    Comment:    Glycemic Control Guidelines for People with Diabetes:Non Diabetic:  <6%Goal of Therapy: <7%Additional Action Suggested:  >8%     CBG: No results for input(s): GLUCAP in the last 168 hours.  Review of Systems:   Endorses abdominal pain, n/v/c, chronic SOB, chronic joint pain   Past Medical History  She,  has a past medical history of Anemia, Anxiety, Atrial fibrillation ([place]), Bronchiectasis, CHF (congestive heart failure) ([employer] [place]), COPD (chronic obstructive pulmonary disease) ([place]), Cricopharyngeal achalasia, Depression, Diverticulosis, Dyslipidemia, Eczema, GERD with stricture, Glaucoma, H. pylori infection, Hypertension, Hyponatremia, Hypothyroid, IBS (irritable bowel syndrome), Lumbar disc disease, Neuropathy of both feet, Segmental colitis ([place]), Vertigo, and Wears glasses.   Surgical History    Past Surgical History:  Procedure Laterality Date   BREAST SURGERY     br bx   CARDIAC CATHETERIZATION  07/01/2013   CATARACT EXTRACTION     both   COLONOSCOPY     ESOPHAGOSCOPY W/ BOTOX INJECTION  12/11/2011   Procedure: ESOPHAGOSCOPY WITH BOTOX INJECTION;  Surgeon: Rozetta Nunnery, MD;  Location: [facility];  Service: ENT;  Laterality: N/A;  esophogoscopy with dilation, botox injection   FOOT SURGERY  03/14/2011   gastroc slide-rt   HIP ARTHROPLASTY Right 10/28/2019   Procedure: ARTHROPLASTY BIPOLAR HIP  (HEMIARTHROPLASTY);  Surgeon: Marybelle Killings, MD;  [facility];  Service: Orthopedics;  Laterality: Right;   TOTAL ABDOMINAL HYSTERECTOMY     TOTAL HIP REVISION Right 11/16/2019   Procedure: TOTAL HIP REVISION BIPOLAR TO CEMENTED BIPOLAR;  Surgeon: Marybelle Killings, MD;  Location: [facility];  Service: Orthopedics;  Laterality: Right;   WISDOM TOOTH EXTRACTION       Social History   reports that she has never smoked. She has never used smokeless tobacco. She reports that she does not drink alcohol and does not use drugs.   Family History   Her family history includes Atrial fibrillation in an other family member; Cancer in her father; Colon polyps in her sister; Emphysema in her brother; Hypertension in her mother; Kidney disease in her sister; Other in her father; Pancreatic cancer in her sister; Stroke in her mother.   Allergies Allergies  Allergen  Reactions   Doxycycline Other (See Comments)    Rash on face and neck   Vicodin [Hydrocodone-Acetaminophen]     Hallucinations    Flagyl [Metronidazole] Nausea And Vomiting   Moxifloxacin Other (See Comments)     Weakness/fatigue   Pantoprazole Sodium Rash   Penicillins Rash    Has patient had a PCN reaction causing immediate rash, facial/tongue/throat swelling, SOB or lightheadedness with hypotension:unsure Has patient had a PCN reaction causing severe rash involving mucus membranes or skin necrosis:unsure Has patient had a PCN reaction that required hospitalization:No Has patient had a PCN reaction occurring within the last 10 years:Yes Cannot recall exact reaction due to time lapse If all of the above answers are "NO", then may proceed with Cephalosporin use.      Home Medications  Prior to Admission medications   Medication Sig Start Date End Date Taking? Authorizing Provider  Biotin 5000 MCG TABS Take 5,000 mcg by mouth daily.   Yes [provider]  Cholecalciferol (VITAMIN D-3) 125 MCG (5000 UT) TABS Take 5,000  Units by mouth daily.    Yes [provider]  FLUoxetine (PROZAC) 10 MG tablet Take 10 mg by mouth daily.    Yes [provider]  folic acid (FOLVITE) 914 MCG tablet Take 1 mg by mouth 3 (three) times a week. MWF   Yes [provider]  levothyroxine (SYNTHROID, LEVOTHROID) 50 MCG tablet Take 1 tablet (50 mcg total) by mouth daily. Patient taking differently: Take 50 mcg by mouth daily before breakfast.  08/12/13  Yes Rowe Clack, MD  methocarbamol (ROBAXIN) 500 MG tablet Take 500 mg by mouth 2 (two) times daily. 01/25/20  Yes [provider]  pravastatin (PRAVACHOL) 40 MG tablet Take 40 mg by mouth at bedtime.  10/03/16  Yes [provider]  SYMBICORT 80-4.5 MCG/ACT inhaler INHALE 2 PUFFS INTO THE LUNGS TWICE DAILY Patient taking differently: Inhale 2 puffs into the lungs in the morning and at bedtime.  06/17/19  Yes Brand Males, MD  traMADol (ULTRAM) 50 MG tablet Take 1 tablet (50 mg total) by mouth every 6 (six) hours as needed for moderate pain. 11/20/19  Yes Marybelle Killings, MD  vitamin B-12 (CYANOCOBALAMIN) 1000 MCG tablet Take 1,000 mcg by mouth 3 (three) times a week. MWF   Yes [provider]  warfarin (COUMADIN) 2.5 MG tablet Take 1/2-1 tablet by mouth daily as directed by [facility] Patient taking differently: Take 1.25 mg by mouth one time only at 4 PM.  01/27/16  Yes Deboraha Sprang, MD  zinc gluconate 50 MG tablet Take 50 mg by mouth daily.   Yes [provider]  bisoprolol (ZEBETA) 10 MG tablet Take 1 tablet by mouth daily. Please monitor your blood pressure and heart rate daily for a couple of weeks to make sure they remain stable. Patient not taking: Reported on 02/08/2020 07/20/19   Deboraha Sprang, MD  diltiazem (CARDIZEM CD) 240 MG 24 hr capsule Take 1 capsule (240 mg total) by mouth daily. Patient not taking: Reported on 02/08/2020 02/16/19   Shirley Friar, PA-C  docusate sodium (COLACE) 100 MG capsule  Take 1 capsule (100 mg total) by mouth 2 (two) times daily. Patient not taking: Reported on 02/08/2020 10/30/19   Harold Hedge, MD  omeprazole (PRILOSEC) 20 MG capsule TAKE 1 CAPSULE(20 MG) BY MOUTH DAILY Patient not taking: Reported on 02/08/2020 11/07/15   Pyrtle, Lajuan Lines, MD     Eliseo Gum MSN, AGACNP-BC  [place] [phone] If no answer, [phone] 02/08/2020, 5:04 PM

## 2020-02-08 NOTE — Anesthesia Procedure Notes (Addendum)
Central Venous Catheter Insertion Performed by: Roberts Gaudy, MD, anesthesiologist Start/End8/03/2020 6:00 PM, 02/08/2020 6:10 PM Patient location: OR. Preanesthetic checklist: patient identified, IV checked, site marked, risks and benefits discussed, surgical consent, monitors and equipment checked, pre-op evaluation, timeout performed and anesthesia consent Lidocaine 1% used for infiltration and patient sedated Hand hygiene performed  and maximum sterile barriers used  Catheter size: 8 Fr Total catheter length 16. Central line was placed.Double lumen Procedure performed using ultrasound guided technique. Ultrasound Notes:anatomy identified, needle tip was noted to be adjacent to the nerve/plexus identified, no ultrasound evidence of intravascular and/or intraneural injection and image(s) printed for medical record Attempts: 1 Following insertion, dressing applied and line sutured. Post procedure assessment: blood return through all ports  Patient tolerated the procedure well with no immediate complications.

## 2020-02-08 NOTE — Consult Note (Signed)
[facility] Surgery Consult Note  Stephanie Cortez 11-07-1933  [id number].    Requesting MD: Lysbeth Galas Weekly Chief Complaint/Reason for Consult: ischemic small bowel  HPI:  Stephanie Cortez is an 86yo PMH A fib on coumadin (INR 3.3), interstitial lung disease on nighttime oxygen, HTN, HLD, hypothyroidism, CHF (EF 60-65% 10/2019), who presented to [facility] earlier today complaining of 4 days of worsening abdominal pain. Pain is now diffuse. It is constant and severe. Associated with multiple episodes of n/v. Thought she was constipated and used an enema yesterday with no benefit. Denies fever or chills. States that she has never had pain like this before.  In the ED she was found to have HR 120s-130s and irregular.  BP initially 100s/70s however had numerous episodes of hypotension w/ lowest BP 70s/40s. CBC 18.5, lactic acid 2.4, hgb 13.9, INR 3.3. She was given 5mg  vitamin K.  CTA shows small bowel ischemia within the mid and lower abdomen, demonstrating edematous, nonenhancing pattern; favored to be secondary to mesenteric adhesions/torsion, and not from thromboembolic arterial disease. General surgery asked to see. Covid negative.  Abdominal surgical history: total abdominal hysterectomy Nonsmoker Lived at home independently until hip surgery 10/2019, daughter now lives with her Ambulates with walker  Review of Systems  Constitutional: Negative.   Respiratory: Positive for shortness of breath. Negative for wheezing.   Cardiovascular: Negative for chest pain.  Gastrointestinal: Positive for abdominal pain, constipation, nausea and vomiting.  Genitourinary: Negative.   Musculoskeletal: Positive for joint pain.   All systems reviewed and otherwise negative except for as above  Family History  Problem Relation Age of Onset  . Hypertension Mother   . Stroke Mother   . Emphysema Brother   . Other Father        miner's lung  . Cancer Father        Lung  . Colon polyps Sister   .  Pancreatic cancer Sister   . Kidney disease Sister   . Atrial fibrillation Other        siblings    Past Medical History:  Diagnosis Date  . Anemia    - Hgb 9.7gm% on 07/13/2008 in [place] -  Hgg 129gm% wiht normal irone levsl and ferritin 10/27/2008 in [place] Recurrent otitis/sinusitis  . Anxiety    chronic BZ prn  . Atrial fibrillation (HCC)    chronic anticoag  . Bronchiectasis    >PFT 07/13/2008 in [place] 1.9L/76%, FVC 2.45L/74, Ratio 79, TLC 121%, DLCO 64%  AE BRonchiectasis - Dec 2010.New Rx:  outpatient - Feb 2011 - Rx outpatient  . CHF (congestive heart failure) ([place])   . COPD (chronic obstructive pulmonary disease) (HCC)    bronchiectasis  . Cricopharyngeal achalasia   . Depression   . Diverticulosis   . Dyslipidemia   . Eczema   . GERD with stricture   . Glaucoma   . H. pylori infection   . Hypertension   . Hyponatremia    chronic, s/p endo eval 06/2012  . Hypothyroid   . IBS (irritable bowel syndrome)   . Lumbar disc disease   . Neuropathy of both feet   . Segmental colitis ([place])   . Vertigo   . Wears glasses     Past Surgical History:  Procedure Laterality Date  . BREAST SURGERY     br bx  . CARDIAC CATHETERIZATION  07/01/2013  . CATARACT EXTRACTION     both  . COLONOSCOPY    . ESOPHAGOSCOPY W/ BOTOX INJECTION  12/11/2011   Procedure: ESOPHAGOSCOPY WITH BOTOX INJECTION;  Surgeon: Rozetta Nunnery, MD;  Location: [facility];  Service: ENT;  Laterality: N/A;  esophogoscopy with dilation, botox injection  . FOOT SURGERY  03/14/2011   gastroc slide-rt  . HIP ARTHROPLASTY Right 10/28/2019   Procedure: ARTHROPLASTY BIPOLAR HIP (HEMIARTHROPLASTY);  Surgeon: Marybelle Killings, MD;  Location: [facility];  Service: Orthopedics;  Laterality: Right;  . TOTAL ABDOMINAL HYSTERECTOMY    . TOTAL HIP REVISION Right 11/16/2019   Procedure: TOTAL HIP REVISION BIPOLAR TO CEMENTED BIPOLAR;  Surgeon: Marybelle Killings, MD;  Location: [facility];  Service: Orthopedics;   Laterality: Right;  . WISDOM TOOTH EXTRACTION      Social History:  reports that she has never smoked. She has never used smokeless tobacco. She reports that she does not drink alcohol and does not use drugs.  Allergies:  Allergies  Allergen Reactions  . Doxycycline Other (See Comments)    Rash on face and neck  . Vicodin [Hydrocodone-Acetaminophen]     Hallucinations   . Flagyl [Metronidazole] Nausea And Vomiting  . Moxifloxacin Other (See Comments)     Weakness/fatigue  . Pantoprazole Sodium Rash  . Penicillins Rash    Has patient had a PCN reaction causing immediate rash, facial/tongue/throat swelling, SOB or lightheadedness with hypotension:unsure Has patient had a PCN reaction causing severe rash involving mucus membranes or skin necrosis:unsure Has patient had a PCN reaction that required hospitalization:No Has patient had a PCN reaction occurring within the last 10 years:Yes Cannot recall exact reaction due to time lapse If all of the above answers are "NO", then may proceed with Cephalosporin use.     (Not in a hospital admission)   Prior to Admission medications   Medication Sig Start Date End Date Taking? Authorizing Provider  Biotin 5000 MCG TABS Take 5,000 mcg by mouth daily.   Yes [provider]  Cholecalciferol (VITAMIN D-3) 125 MCG (5000 UT) TABS Take 5,000 Units by mouth daily.    Yes [provider]  FLUoxetine (PROZAC) 10 MG tablet Take 10 mg by mouth daily.    Yes [provider]  folic acid (FOLVITE) 387 MCG tablet Take 1 mg by mouth 3 (three) times a week. MWF   Yes [provider]  levothyroxine (SYNTHROID, LEVOTHROID) 50 MCG tablet Take 1 tablet (50 mcg total) by mouth daily. Patient taking differently: Take 50 mcg by mouth daily before breakfast.  08/12/13  Yes Rowe Clack, MD  methocarbamol (ROBAXIN) 500 MG tablet Take 500 mg by mouth 2 (two) times daily. 01/25/20  Yes [provider]  pravastatin  (PRAVACHOL) 40 MG tablet Take 40 mg by mouth at bedtime.  10/03/16  Yes [provider]  SYMBICORT 80-4.5 MCG/ACT inhaler INHALE 2 PUFFS INTO THE LUNGS TWICE DAILY Patient taking differently: Inhale 2 puffs into the lungs in the morning and at bedtime.  06/17/19  Yes Brand Males, MD  traMADol (ULTRAM) 50 MG tablet Take 1 tablet (50 mg total) by mouth every 6 (six) hours as needed for moderate pain. 11/20/19  Yes Marybelle Killings, MD  vitamin B-12 (CYANOCOBALAMIN) 1000 MCG tablet Take 1,000 mcg by mouth 3 (three) times a week. MWF   Yes [provider]  warfarin (COUMADIN) 2.5 MG tablet Take 1/2-1 tablet by mouth daily as directed by [facility] Patient taking differently: Take 1.25 mg by mouth one time only at 4 PM.  01/27/16  Yes Deboraha Sprang, MD  zinc  gluconate 50 MG tablet Take 50 mg by mouth daily.   Yes [provider]  bisoprolol (ZEBETA) 10 MG tablet Take 1 tablet by mouth daily. Please monitor your blood pressure and heart rate daily for a couple of weeks to make sure they remain stable. Patient not taking: Reported on 02/08/2020 07/20/19   Deboraha Sprang, MD  diltiazem (CARDIZEM CD) 240 MG 24 hr capsule Take 1 capsule (240 mg total) by mouth daily. Patient not taking: Reported on 02/08/2020 02/16/19   Shirley Friar, PA-C  docusate sodium (COLACE) 100 MG capsule Take 1 capsule (100 mg total) by mouth 2 (two) times daily. Patient not taking: Reported on 02/08/2020 10/30/19   Harold Hedge, MD  omeprazole (PRILOSEC) 20 MG capsule TAKE 1 CAPSULE(20 MG) BY MOUTH DAILY Patient not taking: Reported on 02/08/2020 11/07/15   Pyrtle, Lajuan Lines, MD    Blood pressure 117/76, pulse (!) 103, temperature (!) 97.5 F (36.4 C), temperature source Oral, resp. rate (!) 27, height 5\' 6"  (1.676 m), weight 74.8 kg, SpO2 100 %. Physical Exam: General: pleasant, WD/WN white female who is laying in bed in NAD HEENT: head is normocephalic, atraumatic.  Sclera are noninjected.  PERRL.   Ears and nose without any masses or lesions.  Mouth is pink and moist. Dentition fair Heart: irregularly irregular, HR 120's bpm  Normal s1,s2. No obvious murmurs, gallops, or rubs noted.  Feet WWP Lungs: CTAB, no wheezes, rhonchi, or rales noted.  Respiratory effort nonlabored Abd: soft, distended, hypoactive BS, no masses, hernias, or organomegaly. Diffuse tenderness with guarding MS: trace BLE edema, calves soft and nontender Skin: warm and dry with no masses, lesions, or rashes Psych: A&Ox3 with an appropriate affect, some repetitive questioning Neuro: cranial nerves grossly intact, equal strength in BUE/BLE bilaterally, normal speech  Results for orders placed or performed during the hospital encounter of 02/08/20 (from the past 48 hour(s))  Comprehensive metabolic panel     Status: Abnormal   Collection Time: 02/08/20 11:50 AM  Result Value Ref Range   Sodium 132 (L) 135 - 145 mmol/L   Potassium 3.8 3.5 - 5.1 mmol/L   Chloride 95 (L) 98 - 111 mmol/L   CO2 20 (L) 22 - 32 mmol/L   Glucose, Bld 175 (H) 70 - 99 mg/dL    Comment: Glucose reference range applies only to samples taken after fasting for at least 8 hours.   BUN 7 (L) 8 - 23 mg/dL   Creatinine, Ser 0.86 0.44 - 1.00 mg/dL   Calcium 9.0 8.9 - 10.3 mg/dL   Total Protein 6.8 6.5 - 8.1 g/dL   Albumin 2.9 (L) 3.5 - 5.0 g/dL   AST 20 15 - 41 U/L   ALT 11 0 - 44 U/L   Alkaline Phosphatase 114 38 - 126 U/L   Total Bilirubin 0.8 0.3 - 1.2 mg/dL   GFR calc non Af Amer >60 >60 mL/min   GFR calc Af Amer >60 >60 mL/min   Anion gap 17 (H) 5 - 15    Comment: Performed at [facility] [address], [place] [address]  CBC     Status: Abnormal   Collection Time: 02/08/20 11:50 AM  Result Value Ref Range   WBC 18.5 (H) 4.0 - 10.5 K/uL   RBC 4.87 3.87 - 5.11 MIL/uL   Hemoglobin 13.9 12.0 - 15.0 g/dL   HCT 44.8 36 - 46 %   MCV 92.0 80.0 - 100.0 fL  MCH 28.5 26.0 - 34.0 pg   MCHC 31.0 30.0 - 36.0 g/dL   RDW 15.0  11.5 - 15.5 %   Platelets 513 (H) 150 - 400 K/uL   nRBC 0.0 0.0 - 0.2 %    Comment: Performed at [facility], [place] [address], [place], [place] [address]  Type and screen [place]     Status: None (Preliminary result)   Collection Time: 02/08/20 11:50 AM  Result Value Ref Range   ABO/RH(D) O POS    Antibody Screen NEG    Sample Expiration      02/11/2020,2359 Performed at [facility], [place] [address] [place], [place] [address]    Unit Number [id number]    Blood Component Type RBC LR PHER1    Unit division 00    Status of Unit ISSUED    Unit tag comment EMERGENCY RELEASE    Transfusion Status OK TO TRANSFUSE    Crossmatch Result COMPATIBLE    Unit Number [id number]    Blood Component Type RBC LR PHER2    Unit division 00    Status of Unit ISSUED    Unit tag comment EMERGENCY RELEASE    Transfusion Status OK TO TRANSFUSE    Crossmatch Result COMPATIBLE   POC occult blood, ED     Status: Abnormal   Collection Time: 02/08/20 12:07 PM  Result Value Ref Range   Fecal Occult Bld POSITIVE (A) NEGATIVE  Prepare RBC (crossmatch)     Status: None   Collection Time: 02/08/20 12:38 PM  Result Value Ref Range   Order Confirmation      ORDER PROCESSED BY BLOOD BANK Performed at [facility], [place] [address] [place], [place] [address]   Protime-INR     Status: Abnormal   Collection Time: 02/08/20 12:46 PM  Result Value Ref Range   Prothrombin Time 32.2 (H) 11.4 - 15.2 seconds   INR 3.3 (H) 0.8 - 1.2    Comment: (NOTE) INR goal varies based on device and disease states. Performed at [facility], [place] [address] [place], [place] [address]   Lactic acid, plasma     Status: Abnormal   Collection Time: 02/08/20 12:50 PM  Result Value Ref Range   Lactic Acid, Venous 2.4 (HH) 0.5 - 1.9 mmol/L    Comment: CRITICAL RESULT CALLED TO, READ BACK BY AND VERIFIED WITH: L CHILTON RN [phone] BY A BENNETT Performed at [facility], [place] [address] [place], [place] [address]   SARS Coronavirus 2 by RT PCR (hospital order, performed in [facility] hospital lab) Nasopharyngeal Nasopharyngeal Swab     Status: None   Collection Time: 02/08/20  1:26 PM   Specimen: Nasopharyngeal Swab  Result Value Ref Range   SARS Coronavirus 2 NEGATIVE NEGATIVE    Comment: (NOTE) SARS-CoV-2 target nucleic acids are NOT DETECTED.  The SARS-CoV-2 RNA is generally detectable in upper and lower respiratory specimens during the acute phase of infection. The lowest concentration of SARS-CoV-2 viral copies this assay can detect is 250 copies / mL. A negative result does not preclude SARS-CoV-2 infection and should not be used as the sole basis for treatment or other patient management decisions.  A negative result may occur with improper specimen collection / handling, submission of specimen other than nasopharyngeal swab, presence of viral mutation(s) within the areas targeted by this assay, and inadequate number of viral copies (<250 copies / mL). A negative  result must be combined with clinical observations, patient history, and epidemiological information.  Fact Sheet for Patients:   StrictlyIdeas.no  Fact Sheet for Healthcare Providers: BankingDealers.co.za  This test is not yet approved or  cleared by the [place] FDA and has been authorized for detection and/or diagnosis of SARS-CoV-2 by FDA under an Emergency Use Authorization (EUA).  This EUA will remain in effect (meaning this test can be used) for the duration of the COVID-19 declaration under Section 564(b)(1) of the Act, 21 U.S.C. section 360bbb-3(b)(1), unless the authorization is terminated or revoked sooner.  Performed at [facility], [place] [address] [place], [place] [address]   Lactic acid, plasma     Status: Abnormal   Collection Time: 02/08/20  2:49 PM  Result Value Ref Range   Lactic Acid, Venous 2.4  (HH) 0.5 - 1.9 mmol/L    Comment: CRITICAL VALUE NOTED.  VALUE IS CONSISTENT WITH PREVIOUSLY REPORTED AND CALLED VALUE. Performed at [facility], [place] [address] [place], [place] [address]    CT Angio Abd/Pel W and/or Wo Contrast  Result Date: 02/08/2020 CLINICAL DATA:  86 year old female with abdominal pain and possible GI bleeding. EXAM: CTA ABDOMEN AND PELVIS WITHOUT AND WITH CONTRAST TECHNIQUE: Multidetector CT imaging of the abdomen and pelvis was performed using the standard protocol during bolus administration of intravenous contrast. Multiplanar reconstructed images and MIPs were obtained and reviewed to evaluate the vascular anatomy. CONTRAST:  176mL OMNIPAQUE IOHEXOL 350 MG/ML SOLN COMPARISON:  10/25/2011 FINDINGS: VASCULAR Aorta: Mild atherosclerotic changes of the thoracic aorta and mild to moderate atherosclerosis of the abdominal aorta. No dissection. No periaortic fluid. Celiac: Celiac artery patent at the origin without high-grade stenosis. Mild atherosclerosis. Branches are patent and of relatively normal caliber. 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 SMA: The SMA is patent at the origin with minimal atherosclerosis. SMA tapers to very small caliber within the mesentery. There is no occlusion identified on the CT angiogram. Renals: - Right: Right renal artery patent with minimal atherosclerosis. Coronal images demonstrates some beading pattern of the right renal artery. - Left: Left renal artery patent with minimal atherosclerosis. Coronal images demonstrate questionable subtle beading pattern of the left renal artery. IMA: IMA is patent Right lower extremity: Unremarkable course, caliber, and contour of the right iliac system. Mild atherosclerosis. No aneurysm, dissection, or occlusion. Hypogastric artery is patent. Common femoral artery patent. Proximal SFA and profunda femoris patent. Left lower extremity:  Unremarkable course, caliber, and contour of the left iliac system. Mild atherosclerosis. No aneurysm, dissection, or occlusion. Hypogastric artery is patent. Common femoral artery patent. Proximal SFA and profunda femoris patent. Veins: Portal system: Portal venous system patent. No esophageal varices. No evidence of gastric varices. Splenic vein patent of small caliber. The SMV is patent at the confluence with the portal vein/splenic vein. Distal to the confluence, SMV and the SMV branches are completely decompressed and essentially non visible. Review of the MIP images confirms the above findings. NON-VASCULAR Lower chest: Emphysematous changes at the lung bases. Hepatobiliary: Unremarkable appearance of the liver. Unremarkable gall bladder. Pancreas: Unremarkable. Spleen: I few punctate calcifications within the splenic parenchyma possibly secondary to prior granulomatous disease. Adrenals/Urinary Tract: - Right adrenal gland: Unremarkable - Left adrenal gland: Unremarkable. - Right kidney: No hydronephrosis, nephrolithiasis, inflammation, or ureteral dilation. Small hypoechoic/nonenhancing lesions which are too small to characterize. - Left Kidney: No hydronephrosis, nephrolithiasis, inflammation, or ureteral dilation. Small  hypoenhancing hypodense cystic lesions which are too small to characterize. - Urinary Bladder: Unremarkable. Stomach/Bowel: - Stomach: Hiatal hernia. Stomach is decompressed. No fluid fluid level. No accumulation of contrast to suggest extravasation. No focal wall thickening. Mucosa of the stomach enhances within normal limits. - Small bowel: Proximal small bowel decompressed with typical enhancement pattern expected within the small bowel on the venous phase. There is a long segment of small bowel within the mid and lower abdomen demonstrating borderline dilation, extensive edema within the mesentery, and non enhancement on the arterial and venous phases. Single loop within the mid abdomen  anterior to the sacral base on image 131 of series 17 demonstrate is differential enhancement, with enhancement proximally and non enhancement within the adjacent segment. Presumed transition point within the bowel on image 125 of series 17, right abdomen. There is no evidence of accumulation of high density fluid within small bowel that would suggest active extravasation. - Appendix: Appendix not visualized. - Colon: Terminal ileum at the IC valve is decompressed. Proximal colon appears to enhance within normal limits without focal inflammatory changes. Some retained high density material within the transverse colon. Diverticular disease without focal inflammatory changes or distention of the left colon and sigmoid colon. Lymphatic: No lymphadenopathy. Mesenteric: Trace fluid measuring 20 Hounsfield units adjacent to the spleen and the liver. Trace fluid within the pericolic gutter bilaterally. Extensive edema within the small bowel mesentery associated with a nonenhancing bowel loops. Trace free fluid within the anatomic pelvis. No free air. No portal venous gas. Reproductive: Changes of hysterectomy Other: Small fat containing umbilical hernia. Musculoskeletal: Surgical changes of right hip arthroplasty with significant streak artifact limiting evaluation of the pelvis. Degenerative changes of the left hip. Degenerative changes of the visualized thoracolumbar spine. No acute displaced fracture. Osteopenia. IMPRESSION: Small bowel ischemia within the mid and lower abdomen, demonstrating edematous, nonenhancing pattern. This is favored to be secondary to mesenteric adhesions/torsion, and not from thromboembolic arterial disease. Surgical consultation is indicated. CT is negative for evidence of acute gastrointestinal hemorrhage. Small volume reactive ascites. These above preliminary results were discussed by telephone at the time of interpretation on 02/08/2020 at 2:17 pm with Dr. Herbie Baltimore [place] Minimal aortic  atherosclerosis. Aortic Atherosclerosis (ICD10-I70.0). No significant mesenteric arterial disease. Subtle beading changes of the bilateral renal arteries, right greater than left, which may indicate fibromuscular dysplasia. 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 Additional ancillary findings as above. Signed, Dulcy Fanny. Dellia Nims, RPVI [facility] Electronically Signed   By: Corrie Mckusick D.O.   On: 02/08/2020 14:48   Anti-infectives (From admission, onward)   Start     Dose/Rate Route Frequency Ordered Stop   02/08/20 1530  cefTRIAXone (ROCEPHIN) 2 g in sodium chloride 0.9 % 100 mL IVPB     Discontinue     2 g 200 mL/hr over 30 Minutes Intravenous Every 24 hours 02/08/20 1502     02/08/20 1515  cefTRIAXone (ROCEPHIN) 2 g in sodium chloride 0.9 % 100 mL IVPB  Status:  Discontinued        2 g 200 mL/hr over 30 Minutes Intravenous Every 24 hours 02/08/20 1508 02/08/20 1512   02/08/20 1500  piperacillin-tazobactam (ZOSYN) IVPB 2.25 g  Status:  Discontinued        2.25 g 100 mL/hr over 30 Minutes Intravenous  Once 02/08/20 1453 02/08/20 1502        Assessment/Plan A fib on coumadin (INR 3.3) Interstitial lung  disease on nighttime oxygen HTN HLD Hypothyroidism CHF (EF 60-65% 10/2019)  Ischemic small bowel - Patient with peritonitis on exam and ischemic small bowel seen on CTA. Recommend urgent surgery tonight for exploratory laparotomy, bowel resection, possible gastrostomy tube placement. Patient is agreeable. Her INR is 3.3. Will give Eppie Gibson for reversal and plan to take to surgery once this is in. Hold coumadin. Covid test is negative. Keep NPO.   Wellington Hampshire, [facility] 02/08/2020, 3:45 PM Please see Amion for pager number during day hours 7:00am-4:30pm

## 2020-02-08 NOTE — Anesthesia Postprocedure Evaluation (Signed)
Anesthesia Post Note  Patient: Stephanie Cortez  Procedure(s) Performed: EXPLORATORY LAPAROTOMY (N/A Abdomen) SMALL BOWEL RESECTION (N/A Abdomen) INSERTION OF GASTROSTOMY TUBE (N/A Abdomen) LYSIS OF ADHESIONS (N/A Abdomen)     Patient location during evaluation: PACU Anesthesia Type: General Level of consciousness: awake Pain management: pain level controlled Vital Signs Assessment: post-procedure vital signs reviewed and stable Respiratory status: spontaneous breathing, nonlabored ventilation, respiratory function stable and patient connected to nasal cannula oxygen Cardiovascular status: blood pressure returned to baseline and stable Postop Assessment: no apparent nausea or vomiting Anesthetic complications: no   No complications documented.  Last Vitals:  Vitals:   02/08/20 2030 02/08/20 2100  BP: (!) 142/69 138/63  Pulse: 84 86  Resp: 20 20  Temp: 36.6 C   SpO2: 98% 95%    Last Pain:  Vitals:   02/08/20 2030  TempSrc:   PainSc: Asleep                 Jaionna Weisse P Keleigh Kazee

## 2020-02-08 NOTE — Transfer of Care (Addendum)
Immediate Anesthesia Transfer of Care Note  Patient: JASMANE BROCKWAY  Procedure(s) Performed: EXPLORATORY LAPAROTOMY (N/A Abdomen) SMALL BOWEL RESECTION (N/A Abdomen) INSERTION OF GASTROSTOMY TUBE (N/A Abdomen) LYSIS OF ADHESIONS (N/A Abdomen)  Patient Location: PACU  Anesthesia Type:General  Level of Consciousness: awake, alert  and oriented  Airway & Oxygen Therapy: Patient Spontanous Breathing and Patient connected to face mask oxygen  Post-op Assessment: Report given to RN and Post -op Vital signs reviewed and stable  Post vital signs: Reviewed and stable  Last Vitals:  Vitals Value Taken Time  BP 126/55 (ABP)   Temp    Pulse 83 (maintains in A-fib; rate controlled on Diltiazem gtt)   Resp 13   SpO2 99     Last Pain:  Vitals:   02/08/20 1259  TempSrc: Oral  PainSc:          Complications: No complications documented.

## 2020-02-08 NOTE — Anesthesia Preprocedure Evaluation (Signed)
Anesthesia Evaluation  Patient identified by MRN, date of birth, ID band Patient awake    Reviewed: Allergy & Precautions, NPO status , Patient's Chart, lab work & pertinent test results  Airway Mallampati: III  TM Distance: >3 FB Neck ROM: Full    Dental  (+) Teeth Intact, Dental Advisory Given   Pulmonary    breath sounds clear to auscultation       Cardiovascular hypertension,  Rhythm:Irregular Rate:Tachycardia     Neuro/Psych    GI/Hepatic   Endo/Other    Renal/GU      Musculoskeletal   Abdominal Abdomen tender, distended  Peds  Hematology   Anesthesia Other Findings   Reproductive/Obstetrics                             Anesthesia Physical Anesthesia Plan  ASA: IV  Anesthesia Plan: General   Post-op Pain Management:    Induction: Intravenous, Cricoid pressure planned and Rapid sequence  PONV Risk Score and Plan:   Airway Management Planned: Oral ETT  Additional Equipment: Arterial line, CVP and Ultrasound Guidance Line Placement  Intra-op Plan:   Post-operative Plan: Post-operative intubation/ventilation  Informed Consent: I have reviewed the patients History and Physical, chart, labs and discussed the procedure including the risks, benefits and alternatives for the proposed anesthesia with the patient or authorized representative who has indicated his/her understanding and acceptance.     Dental advisory given  Plan Discussed with: CRNA and Anesthesiologist  Anesthesia Plan Comments:         Anesthesia Quick Evaluation

## 2020-02-08 NOTE — H&P (Signed)
History and Physical    Stephanie Cortez QMV:[id number] DOB: Jul 26, 1933 DOA: 02/08/2020  PCP: [name], MD  Patient coming from: Home  I have personally briefly reviewed patient's old medical records in [place]  Chief Complaint: Abdominal pain since 4 days and coffee-ground emesis started this morning.  HPI: Stephanie Cortez is a 86 y.o. female with medical history significant of A. fib on Coumadin, colitis associated with diverticulitis, IBS, cricopharyngeal achalasia, hiatal hernia, chronic back pain, hypertension, hypothyroidism, GERD, depression/anxiety, right hip fracture status post total hip replacement on 11/16/2019 presents to emergency department with abdominal pain since 4 days and coffee-ground emesis started this morning.  Patient tells me that she has worsening generalized abdominal pain, 10 out of 10, constant, sore-like, nonradiating, aggravates with movements, relieved with nothing, associated with coffee-ground emesis which started this morning.  She takes tramadol for her right hip pain.  Has been constipated lately and was prescribed enema by her PCP which was given yesterday evening.  She had regular bowel movement yesterday.  Denies melena.  Denies over-the-counter use of NSAIDs.  No history of headache, blurry vision, chest pain, shortness of breath, palpitation, leg swelling, fever, chills, urinary symptoms.  No history of smoking, alcohol, licit drug use.  ED Course: Upon arrival to ED: Patient tachycardic, tachypneic, initial blood pressure soft, leukocytosis of 18.5, afebrile, platelet: 513, CMP shows sodium of 132, lactic acid: 2.4, POC occult blood positive, INR: 3.3, COVID-19 negative, CT angio of abdomen/pelvis shows small bowel ischemia likely secondary to bowel adhesions/torsions.  Patient received IV fluid bolus, morphine, Zofran, Rocephin in the ED.  EDP consulted general surgery and GI.  [facility] hospitalist consulted for admission.  Review of  Systems: As per HPI otherwise negative.    Past Medical History:  Diagnosis Date  . Anemia    - Hgb 9.7gm% on 07/13/2008 in [place] -  Hgg 129gm% wiht normal irone levsl and ferritin 10/27/2008 in [place] Recurrent otitis/sinusitis  . Anxiety    chronic BZ prn  . Atrial fibrillation (HCC)    chronic anticoag  . Bronchiectasis    >PFT 07/13/2008 in [place] 1.9L/76%, FVC 2.45L/74, Ratio 79, TLC 121%, DLCO 64%  AE BRonchiectasis - Dec 2010.New Rx:  outpatient - Feb 2011 - Rx outpatient  . CHF (congestive heart failure) ([facility])   . COPD (chronic obstructive pulmonary disease) (HCC)    bronchiectasis  . Cricopharyngeal achalasia   . Depression   . Diverticulosis   . Dyslipidemia   . Eczema   . GERD with stricture   . Glaucoma   . H. pylori infection   . Hypertension   . Hyponatremia    chronic, s/p endo eval 06/2012  . Hypothyroid   . IBS (irritable bowel syndrome)   . Lumbar disc disease   . Neuropathy of both feet   . Segmental colitis ([place])   . Vertigo   . Wears glasses     Past Surgical History:  Procedure Laterality Date  . BREAST SURGERY     br bx  . CARDIAC CATHETERIZATION  07/01/2013  . CATARACT EXTRACTION     both  . COLONOSCOPY    . ESOPHAGOSCOPY W/ BOTOX INJECTION  12/11/2011   Procedure: ESOPHAGOSCOPY WITH BOTOX INJECTION;  Surgeon: Rozetta Nunnery, MD;  Location: [facility];  Service: ENT;  Laterality: N/A;  esophogoscopy with dilation, botox injection  . FOOT SURGERY  03/14/2011   gastroc slide-rt  . HIP ARTHROPLASTY Right 10/28/2019   Procedure:  ARTHROPLASTY BIPOLAR HIP (HEMIARTHROPLASTY);  Surgeon: Marybelle Killings, MD;  Location: [facility];  Service: Orthopedics;  Laterality: Right;  . TOTAL ABDOMINAL HYSTERECTOMY    . TOTAL HIP REVISION Right 11/16/2019   Procedure: TOTAL HIP REVISION BIPOLAR TO CEMENTED BIPOLAR;  Surgeon: Marybelle Killings, MD;  Location: [facility];  Service: Orthopedics;  Laterality: Right;  . WISDOM TOOTH EXTRACTION       reports  that she has never smoked. She has never used smokeless tobacco. She reports that she does not drink alcohol and does not use drugs.  Allergies  Allergen Reactions  . Doxycycline Other (See Comments)    Rash on face and neck  . Flagyl [Metronidazole] Nausea And Vomiting  . Moxifloxacin Other (See Comments)     Weakness/fatigue  . Pantoprazole Sodium Rash  . Penicillins Rash    Has patient had a PCN reaction causing immediate rash, facial/tongue/throat swelling, SOB or lightheadedness with hypotension:unsure Has patient had a PCN reaction causing severe rash involving mucus membranes or skin necrosis:unsure Has patient had a PCN reaction that required hospitalization:No Has patient had a PCN reaction occurring within the last 10 years:Yes Cannot recall exact reaction due to time lapse If all of the above answers are "NO", then may proceed with Cephalosporin use.     Family History  Problem Relation Age of Onset  . Hypertension Mother   . Stroke Mother   . Emphysema Brother   . Other Father        miner's lung  . Cancer Father        Lung  . Colon polyps Sister   . Pancreatic cancer Sister   . Kidney disease Sister   . Atrial fibrillation Other        siblings    Prior to Admission medications   Medication Sig Start Date End Date Taking? Authorizing Provider  Biotin 5000 MCG TABS Take 5,000 mcg by mouth daily.   Yes [provider]  Cholecalciferol (VITAMIN D-3) 125 MCG (5000 UT) TABS Take 5,000 Units by mouth daily.    Yes [provider]  FLUoxetine (PROZAC) 10 MG tablet Take 10 mg by mouth daily.    Yes [provider]  folic acid (FOLVITE) 010 MCG tablet Take 1 mg by mouth 3 (three) times a week. MWF   Yes [provider]  levothyroxine (SYNTHROID, LEVOTHROID) 50 MCG tablet Take 1 tablet (50 mcg total) by mouth daily. Patient taking differently: Take 50 mcg by mouth daily before breakfast.  08/12/13  Yes Rowe Clack, MD    methocarbamol (ROBAXIN) 500 MG tablet Take 500 mg by mouth 2 (two) times daily. 01/25/20  Yes [provider]  pravastatin (PRAVACHOL) 40 MG tablet Take 40 mg by mouth at bedtime.  10/03/16  Yes [provider]  SYMBICORT 80-4.5 MCG/ACT inhaler INHALE 2 PUFFS INTO THE LUNGS TWICE DAILY Patient taking differently: Inhale 2 puffs into the lungs in the morning and at bedtime.  06/17/19  Yes Brand Males, MD  traMADol (ULTRAM) 50 MG tablet Take 1 tablet (50 mg total) by mouth every 6 (six) hours as needed for moderate pain. 11/20/19  Yes Marybelle Killings, MD  vitamin B-12 (CYANOCOBALAMIN) 1000 MCG tablet Take 1,000 mcg by mouth 3 (three) times a week. MWF   Yes [provider]  warfarin (COUMADIN) 2.5 MG tablet Take 1/2-1 tablet by mouth daily as directed by [facility] Patient taking differently: Take 1.25 mg by mouth one time only at 4  PM.  01/27/16  Yes Deboraha Sprang, MD  zinc gluconate 50 MG tablet Take 50 mg by mouth daily.   Yes [provider]  bisoprolol (ZEBETA) 10 MG tablet Take 1 tablet by mouth daily. Please monitor your blood pressure and heart rate daily for a couple of weeks to make sure they remain stable. Patient not taking: Reported on 02/08/2020 07/20/19   Deboraha Sprang, MD  diltiazem (CARDIZEM CD) 240 MG 24 hr capsule Take 1 capsule (240 mg total) by mouth daily. Patient not taking: Reported on 02/08/2020 02/16/19   Shirley Friar, PA-C  docusate sodium (COLACE) 100 MG capsule Take 1 capsule (100 mg total) by mouth 2 (two) times daily. Patient not taking: Reported on 02/08/2020 10/30/19   Harold Hedge, MD  omeprazole (PRILOSEC) 20 MG capsule TAKE 1 CAPSULE(20 MG) BY MOUTH DAILY Patient not taking: Reported on 02/08/2020 11/07/15   Jerene Bears, MD    Physical Exam: Vitals:   02/08/20 1415 02/08/20 1430 02/08/20 1431 02/08/20 1445  BP: 126/87 119/83  137/69  Pulse:    (!) 103  Resp: (!) 29 (!) 22  (!) 28  Temp:      TempSrc:       SpO2:   96% 100%  Weight:      Height:        Constitutional: NAD, calm, comfortable, appears weak, lethargic, pale, on 2 L of oxygen via nasal cannula Eyes: PERRL, lids and conjunctivae normal ENMT: Mucous membranes are dry. Posterior pharynx clear of any exudate or lesions.Normal dentition.  Neck: normal, supple, no masses, no thyromegaly Respiratory: Tachypneic, no wheezing, rhonchi or crackles Cardiovascular: Tachycardic no murmurs / rubs / gallops. No extremity edema. 2+ pedal pulses. No carotid bruits.  Abdomen: Generalized abdominal tenderness positive, no rigidity, abdomen soft, bowel sounds positive.   Musculoskeletal: no clubbing / cyanosis. No joint deformity upper and lower extremities. Good ROM, no contractures. Normal muscle tone.  Skin: no rashes, lesions, ulcers. No induration Neurologic: CN 2-12 grossly intact. Sensation intact, DTR normal. Strength 5/5 in all 4.  Psychiatric: Normal judgment and insight. Alert and oriented x 3. Normal mood.    Labs on Admission: I have personally reviewed following labs and imaging studies  CBC: Recent Labs  Lab 02/08/20 1150  WBC 18.5*  HGB 13.9  HCT 44.8  MCV 92.0  PLT 301*   Basic Metabolic Panel: Recent Labs  Lab 02/08/20 1150  NA 132*  K 3.8  CL 95*  CO2 20*  GLUCOSE 175*  BUN 7*  CREATININE 0.86  CALCIUM 9.0   GFR: Estimated Creatinine Clearance: 50.4 mL/min (by C-G formula based on SCr of 0.86 mg/dL). Liver Function Tests: Recent Labs  Lab 02/08/20 1150  AST 20  ALT 11  ALKPHOS 114  BILITOT 0.8  PROT 6.8  ALBUMIN 2.9*   No results for input(s): LIPASE, AMYLASE in the last 168 hours. No results for input(s): AMMONIA in the last 168 hours. Coagulation Profile: Recent Labs  Lab 02/08/20 1246  INR 3.3*   Cardiac Enzymes: No results for input(s): CKTOTAL, CKMB, CKMBINDEX, TROPONINI in the last 168 hours. BNP (last 3 results) No results for input(s): PROBNP in the last 8760 hours. HbA1C: No  results for input(s): HGBA1C in the last 72 hours. CBG: No results for input(s): GLUCAP in the last 168 hours. Lipid Profile: No results for input(s): CHOL, HDL, LDLCALC, TRIG, CHOLHDL, LDLDIRECT in the last 72 hours. Thyroid Function Tests: No results for input(s): TSH,  T4TOTAL, FREET4, T3FREE, THYROIDAB in the last 72 hours. Anemia Panel: No results for input(s): VITAMINB12, FOLATE, FERRITIN, TIBC, IRON, RETICCTPCT in the last 72 hours. Urine analysis:    Component Value Date/Time   COLORURINE STRAW (A) 11/14/2019 0210   APPEARANCEUR CLEAR 11/14/2019 0210   LABSPEC 1.006 11/14/2019 0210   PHURINE 7.0 11/14/2019 0210   GLUCOSEU NEGATIVE 11/14/2019 0210   HGBUR SMALL (A) 11/14/2019 0210   BILIRUBINUR NEGATIVE 11/14/2019 0210   BILIRUBINUR Neg 02/06/2012 1022   KETONESUR NEGATIVE 11/14/2019 0210   PROTEINUR NEGATIVE 11/14/2019 0210   UROBILINOGEN 0.2 04/07/2012 1443   NITRITE NEGATIVE 11/14/2019 0210   LEUKOCYTESUR NEGATIVE 11/14/2019 0210    Radiological Exams on Admission: CT Angio Abd/Pel W and/or Wo Contrast  Result Date: 02/08/2020 CLINICAL DATA:  86 year old female with abdominal pain and possible GI bleeding. EXAM: CTA ABDOMEN AND PELVIS WITHOUT AND WITH CONTRAST TECHNIQUE: Multidetector CT imaging of the abdomen and pelvis was performed using the standard protocol during bolus administration of intravenous contrast. Multiplanar reconstructed images and MIPs were obtained and reviewed to evaluate the vascular anatomy. CONTRAST:  141mL OMNIPAQUE IOHEXOL 350 MG/ML SOLN COMPARISON:  10/25/2011 FINDINGS: VASCULAR Aorta: Mild atherosclerotic changes of the thoracic aorta and mild to moderate atherosclerosis of the abdominal aorta. No dissection. No periaortic fluid. Celiac: Celiac artery patent at the origin without high-grade stenosis. Mild atherosclerosis. Branches are patent and of relatively normal caliber. 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 SMA: The SMA is patent at the origin with minimal atherosclerosis. SMA tapers to very small caliber within the mesentery. There is no occlusion identified on the CT angiogram. Renals: - Right: Right renal artery patent with minimal atherosclerosis. Coronal images demonstrates some beading pattern of the right renal artery. - Left: Left renal artery patent with minimal atherosclerosis. Coronal images demonstrate questionable subtle beading pattern of the left renal artery. IMA: IMA is patent Right lower extremity: Unremarkable course, caliber, and contour of the right iliac system. Mild atherosclerosis. No aneurysm, dissection, or occlusion. Hypogastric artery is patent. Common femoral artery patent. Proximal SFA and profunda femoris patent. Left lower extremity: Unremarkable course, caliber, and contour of the left iliac system. Mild atherosclerosis. No aneurysm, dissection, or occlusion. Hypogastric artery is patent. Common femoral artery patent. Proximal SFA and profunda femoris patent. Veins: Portal system: Portal venous system patent. No esophageal varices. No evidence of gastric varices. Splenic vein patent of small caliber. The SMV is patent at the confluence with the portal vein/splenic vein. Distal to the confluence, SMV and the SMV branches are completely decompressed and essentially non visible. Review of the MIP images confirms the above findings. NON-VASCULAR Lower chest: Emphysematous changes at the lung bases. Hepatobiliary: Unremarkable appearance of the liver. Unremarkable gall bladder. Pancreas: Unremarkable. Spleen: I few punctate calcifications within the splenic parenchyma possibly secondary to prior granulomatous disease. Adrenals/Urinary Tract: - Right adrenal gland: Unremarkable - Left adrenal gland: Unremarkable. - Right kidney: No hydronephrosis, nephrolithiasis, inflammation, or ureteral dilation. Small hypoechoic/nonenhancing lesions which are too  small to characterize. - Left Kidney: No hydronephrosis, nephrolithiasis, inflammation, or ureteral dilation. Small hypoenhancing hypodense cystic lesions which are too small to characterize. - Urinary Bladder: Unremarkable. Stomach/Bowel: - Stomach: Hiatal hernia. Stomach is decompressed. No fluid fluid level. No accumulation of contrast to suggest extravasation. No focal wall thickening. Mucosa of the stomach enhances within normal limits. - Small bowel: Proximal small bowel decompressed with typical enhancement pattern expected within the  small bowel on the venous phase. There is a long segment of small bowel within the mid and lower abdomen demonstrating borderline dilation, extensive edema within the mesentery, and non enhancement on the arterial and venous phases. Single loop within the mid abdomen anterior to the sacral base on image 131 of series 17 demonstrate is differential enhancement, with enhancement proximally and non enhancement within the adjacent segment. Presumed transition point within the bowel on image 125 of series 17, right abdomen. There is no evidence of accumulation of high density fluid within small bowel that would suggest active extravasation. - Appendix: Appendix not visualized. - Colon: Terminal ileum at the IC valve is decompressed. Proximal colon appears to enhance within normal limits without focal inflammatory changes. Some retained high density material within the transverse colon. Diverticular disease without focal inflammatory changes or distention of the left colon and sigmoid colon. Lymphatic: No lymphadenopathy. Mesenteric: Trace fluid measuring 20 Hounsfield units adjacent to the spleen and the liver. Trace fluid within the pericolic gutter bilaterally. Extensive edema within the small bowel mesentery associated with a nonenhancing bowel loops. Trace free fluid within the anatomic pelvis. No free air. No portal venous gas. Reproductive: Changes of hysterectomy Other: Small  fat containing umbilical hernia. Musculoskeletal: Surgical changes of right hip arthroplasty with significant streak artifact limiting evaluation of the pelvis. Degenerative changes of the left hip. Degenerative changes of the visualized thoracolumbar spine. No acute displaced fracture. Osteopenia. IMPRESSION: Small bowel ischemia within the mid and lower abdomen, demonstrating edematous, nonenhancing pattern. This is favored to be secondary to mesenteric adhesions/torsion, and not from thromboembolic arterial disease. Surgical consultation is indicated. CT is negative for evidence of acute gastrointestinal hemorrhage. Small volume reactive ascites. These above preliminary results were discussed by telephone at the time of interpretation on 02/08/2020 at 2:17 pm with Dr. Herbie Baltimore LOCKWOOD Minimal aortic atherosclerosis. Aortic Atherosclerosis (ICD10-I70.0). No significant mesenteric arterial disease. Subtle beading changes of the bilateral renal arteries, right greater than left, which may indicate fibromuscular dysplasia. 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 Additional ancillary findings as above. Signed, Dulcy Fanny. Dellia Nims, RPVI [facility] Electronically Signed   By: Corrie Mckusick D.O.   On: 02/08/2020 14:48    EKG: Independently reviewed.  A. fib with right bundle branch block.   Assessment/Plan Principal Problem:   Small bowel ischemia (HCC) Active Problems:   HYPERTENSION, BENIGN   A-fib (HCC)   Hypothyroid   GERD (gastroesophageal reflux disease)   Hyponatremia   Depression   Sepsis ([place])   Thrombocytosis (HCC)    Sepsis secondary to small bowel ischemia: Secondary to mesenteric irritation/torsion. -Patient tachycardic, tachypneic, blood pressure soft upon arrival, he has leukocytosis of 18.5, lactic acid: 2.4.  She is afebrile.  COVID-19 negative, reviewed CT abdomen/pelvis.  POC occult blood positive,  INR: 3.3. -Patient IV fluid bolus, morphine, Zofran, 1 unit of PRBC and Zofran in ED.   -EDP consulted GI and general surgery-will take her to OR asap. -Admit patient stepdown unit for close monitoring. -Continue IV fluids.  Dilaudid as needed for pain control.  Zofran as needed for nausea and vomiting. -Continue IV Rocephin. -We will keep her n.p.o. -Monitor vitals and H&H closely.  Hypertension: Blood pressure soft upon arrival -Hold home BP meds as patient is n.p.o. -Hydralazine as needed for blood pressure more than 160/100  Hypothyroidism: Check TSH -Hold p.o. levothyroxine  Hyperlipidemia: Hold statin.  Status post right hip replacement: -Dilaudid as  needed for pain control  Chronic constipation: Secondary to tramadol -Had bowel movements yesterday evening after receiving enema at home.  Thrombocytosis: Platelet: 513, -Likely reactive. -Repeat CBC tomorrow a.m.  A. fib on Coumadin: -INR is supratherapeutic.  Hold Coumadin for now.  Status post Kcentra -On telemetry.  Depression/anxiety: Hold home meds for now  Hyponatremia: Sodium of 132 -Continue IV fluids.  Patient is on increased risk of morbidity and mortality.  DVT prophylaxis: SCD, Code Status: Full code Family Communication: Patient's son-in-law present at bedside.  Plan of care discussed with patient in length and she verbalized understanding and agreed with it. Disposition Plan: Home/SNF after surgery Consults called: GI and general surgery Admission status: Inpatient   Mckinley Jewel MD [facility]  If 7PM-7AM, please contact night-coverage [email or web address] Password [email or web address]  02/08/2020, 3:08 PM

## 2020-02-08 NOTE — Consult Note (Addendum)
Referring Provider:  [facility]         Primary Care Physician:  [name], MD Primary Gastroenterologist: Zenovia Jarred, MD             We were asked to see this patient for:   Coffee ground emesis               ASSESSMENT /  PLAN    Stephanie Cortez is a 86 y.o. female PMH significant for, but not necessarily limited to,   Segmental colitis associated with diverticulosis ( SCAD), IBS, cricopharyngeal achalasia, GERD, hiatal hernia, Afib on coumadin, HTN. Chronic hyponatremia,  Lumbar disc disease ,interstitial lung disease on 02 at night, hypothyroidism   # Low volume coffee ground emesis --Most likely related to below or possible Mallory Weiss tear or esophagitis from vomiting.  --Empirically treat with IV PPI daily for now --No plans / need for further GI evaluation at this time --Call for questions.                                                                                                                                 # Small bowel ischemia.  --CTA suggests ischemia result of adhesions / torsion rather than from thromboembolic lesion.  --Hypotensive episodes, leukocytosis, lactic acid 2.4 --General Surgery in the room. Plan is to reverse INR and take for laparotomy as soon as possible.   # Chronic anticoagulation --On coumadin for hx of AFib --Got Vit K. She needs immediate reversal of INR for Surgery ordering  [facility]  HPI:    Chief Complaint: GI bleed  Stephanie Cortez is a 86 y.o. female who las saw [place] in 2018 for evaluation of segmental colitis. She had stopped Lialda and was doing well. The end of April 2021 she fell and sustained right femur fracture. She underwent hemiarthroplasty. She went to rehab but then returned to OR 11/16/19 for a revision. On 11/20/19 she was transferred to a SNF.   Patient brought to ED today with complaints of abdominal pain, nausea and coffee ground emesis. Patient says she has been having generalized abdominal pain for 4 -6  days. Son-Iin-law helps with history and believes the pain actually started yesterday and got progressively. Pain not affected by eating. It may hurt worse in certain positions. The pain was associated with nausea and vomiting with small amounts of dark brown emesis. No fevers. No NSAIDS. She takes daily Prilosec at home. On coumadin. She complains of constipation for a few days. Got an enema yesterday, so far no BM. Over the last few days she has had a scant amount of red blood in stool when able to have BM. She struggles with chronic constipation.   ED workup:  She was hypotensive in route to ED, got 500 ml fluid bolus. In ED SBP in upper 80s, improved now to 119. She has been tachycardic up to 120's but has Afib. Hgb is  13.9 with baseline of 10-11. Hct 44. BUN is 7. Albumin 2.9. Lactic acid 2.4. We were called to see patient prior to CT angio results which showed small bowel ischemia within the mid and lower abdomen favored to be from mesenteric adhesions / torsion rather than thromboembolic arterial disease.    PREVIOUS ENDOSCOPIC EVALUATIONS / GI STUDIES :  Past Medical History:  Diagnosis Date  . Anemia    - Hgb 9.7gm% on 07/13/2008 in [place] -  Hgg 129gm% wiht normal irone levsl and ferritin 10/27/2008 in [place] Recurrent otitis/sinusitis  . Anxiety    chronic BZ prn  . Atrial fibrillation (HCC)    chronic anticoag  . Bronchiectasis    >PFT 07/13/2008 in [place] 1.9L/76%, FVC 2.45L/74, Ratio 79, TLC 121%, DLCO 64%  AE BRonchiectasis - Dec 2010.New Rx:  outpatient - Feb 2011 - Rx outpatient  . CHF (congestive heart failure) ([place])   . COPD (chronic obstructive pulmonary disease) (HCC)    bronchiectasis  . Cricopharyngeal achalasia   . Depression   . Diverticulosis   . Dyslipidemia   . Eczema   . GERD with stricture   . Glaucoma   . H. pylori infection   . Hypertension   . Hyponatremia    chronic, s/p endo eval 06/2012  . Hypothyroid   . IBS (irritable bowel syndrome)   . Lumbar  disc disease   . Neuropathy of both feet   . Segmental colitis ([place])   . Vertigo   . Wears glasses     Past Surgical History:  Procedure Laterality Date  . BREAST SURGERY     br bx  . CARDIAC CATHETERIZATION  07/01/2013  . CATARACT EXTRACTION     both  . COLONOSCOPY    . ESOPHAGOSCOPY W/ BOTOX INJECTION  12/11/2011   Procedure: ESOPHAGOSCOPY WITH BOTOX INJECTION;  Surgeon: Rozetta Nunnery, MD;  Location: [place];  Service: ENT;  Laterality: N/A;  esophogoscopy with dilation, botox injection  . FOOT SURGERY  03/14/2011   gastroc slide-rt  . HIP ARTHROPLASTY Right 10/28/2019   Procedure: ARTHROPLASTY BIPOLAR HIP (HEMIARTHROPLASTY);  Surgeon: Marybelle Killings, MD;  Location: [facility];  Service: Orthopedics;  Laterality: Right;  . TOTAL ABDOMINAL HYSTERECTOMY    . TOTAL HIP REVISION Right 11/16/2019   Procedure: TOTAL HIP REVISION BIPOLAR TO CEMENTED BIPOLAR;  Surgeon: Marybelle Killings, MD;  Location: [facility];  Service: Orthopedics;  Laterality: Right;  . WISDOM TOOTH EXTRACTION      Prior to Admission medications   Medication Sig Start Date End Date Taking? Authorizing Provider  Biotin 5000 MCG TABS Take 5,000 mcg by mouth daily.    [provider]  bisoprolol (ZEBETA) 10 MG tablet Take 1 tablet by mouth daily. Please monitor your blood pressure and heart rate daily for a couple of weeks to make sure they remain stable. Patient taking differently: Take 10 mg by mouth daily. Please monitor your blood pressure and heart rate daily for a couple of weeks to make sure they remain stable. 07/20/19   Deboraha Sprang, MD  Cholecalciferol (VITAMIN D-3) 125 MCG (5000 UT) TABS Take 5,000 Units by mouth daily.     [provider]  dextromethorphan-guaiFENesin (MUCINEX DM) 30-600 MG 12hr tablet Take 1-2 tablets by mouth 2 (two) times daily as needed (for allegies/respiratory issues.).    [provider]  diltiazem (CARDIZEM CD) 240 MG 24 hr capsule Take 1 capsule  (240 mg total) by mouth daily. Patient  taking differently: Take 240 mg by mouth at bedtime.  02/16/19   Shirley Friar, PA-C  diphenhydrAMINE (BENADRYL) 25 MG tablet Take 25 mg by mouth every 6 (six) hours as needed for allergies.    [provider]  docusate sodium (COLACE) 100 MG capsule Take 1 capsule (100 mg total) by mouth 2 (two) times daily. 10/30/19   Harold Hedge, MD  FLUoxetine (PROZAC) 10 MG tablet Take 10 mg by mouth 2 (two) times daily as needed (anxiety).     [provider]  fluticasone (FLONASE) 50 MCG/ACT nasal spray Place 1 spray into both nostrils 2 (two) times daily as needed (for nasal congestion).    [provider]  folic acid (FOLVITE) 696 MCG tablet Take 1 mg by mouth 3 (three) times a week. MWF    [provider]  furosemide (LASIX) 20 MG tablet Take 20 mg by mouth daily as needed for fluid.     [provider]  levothyroxine (SYNTHROID, LEVOTHROID) 50 MCG tablet Take 1 tablet (50 mcg total) by mouth daily. Patient taking differently: Take 50 mcg by mouth daily before breakfast.  08/12/13   Rowe Clack, MD  olmesartan (BENICAR) 40 MG tablet Take 40 mg by mouth daily.    [provider]  omeprazole (PRILOSEC) 20 MG capsule TAKE 1 CAPSULE(20 MG) BY MOUTH DAILY Patient taking differently: Take 20 mg by mouth daily.  11/07/15   Pyrtle, Lajuan Lines, MD  pravastatin (PRAVACHOL) 40 MG tablet Take 40 mg by mouth at bedtime.  10/03/16   [provider]  SYMBICORT 80-4.5 MCG/ACT inhaler INHALE 2 PUFFS INTO THE LUNGS TWICE DAILY Patient taking differently: Inhale 2 puffs into the lungs in the morning and at bedtime.  06/17/19   Brand Males, MD  traMADol (ULTRAM) 50 MG tablet Take 1 tablet (50 mg total) by mouth every 6 (six) hours as needed for moderate pain. 11/20/19   Marybelle Killings, MD  vitamin B-12 (CYANOCOBALAMIN) 1000 MCG tablet Take 1,000 mcg by mouth 3 (three) times a week. MWF    [provider]   warfarin (COUMADIN) 2.5 MG tablet Take 1/2-1 tablet by mouth daily as directed by [facility] Patient taking differently: Take 1.25-2.5 mg by mouth See admin instructions. Take 1 tablet (2.5 mg)  Tuesday Thursday & Saturday Sunday Take 0.5 tablet (1.25 mg) Monday. Wednesday & Friday 01/27/16   Deboraha Sprang, MD  zinc gluconate 50 MG tablet Take 50 mg by mouth daily.    [provider]    Current Facility-Administered Medications  Medication Dose Route Frequency Provider Last Rate Last Admin  . octreotide (SANDOSTATIN) 500 mcg in sodium chloride 0.9 % 250 mL (2 mcg/mL) infusion  50 mcg/hr Intravenous Continuous Carmin Muskrat, MD      . phytonadione (VITAMIN K) 5 mg in dextrose 5 % 50 mL IVPB  5 mg Intravenous Once Silvestre Gunner, MD       Current Outpatient Medications  Medication Sig Dispense Refill  . Biotin 5000 MCG TABS Take 5,000 mcg by mouth daily.    . bisoprolol (ZEBETA) 10 MG tablet Take 1 tablet by mouth daily. Please monitor your blood pressure and heart rate daily for a couple of weeks to make sure they remain stable. (Patient taking differently: Take 10 mg by mouth daily. Please monitor your blood pressure and heart rate daily for a couple of weeks to make sure they remain stable.) 90 tablet 1  . Cholecalciferol (VITAMIN D-3) 125 MCG (  5000 UT) TABS Take 5,000 Units by mouth daily.     [name] Kitchen dextromethorphan-guaiFENesin (MUCINEX DM) 30-600 MG 12hr tablet Take 1-2 tablets by mouth 2 (two) times daily as needed (for allegies/respiratory issues.).    [name] Kitchen diltiazem (CARDIZEM CD) 240 MG 24 hr capsule Take 1 capsule (240 mg total) by mouth daily. (Patient taking differently: Take 240 mg by mouth at bedtime. ) 90 capsule 1  . diphenhydrAMINE (BENADRYL) 25 MG tablet Take 25 mg by mouth every 6 (six) hours as needed for allergies.    [name] Kitchen docusate sodium (COLACE) 100 MG capsule Take 1 capsule (100 mg total) by mouth 2 (two) times daily. 10 capsule 0  . FLUoxetine (PROZAC) 10 MG  tablet Take 10 mg by mouth 2 (two) times daily as needed (anxiety).     . fluticasone (FLONASE) 50 MCG/ACT nasal spray Place 1 spray into both nostrils 2 (two) times daily as needed (for nasal congestion).    . folic acid (FOLVITE) 945 MCG tablet Take 1 mg by mouth 3 (three) times a week. MWF    . furosemide (LASIX) 20 MG tablet Take 20 mg by mouth daily as needed for fluid.     [name] Kitchen levothyroxine (SYNTHROID, LEVOTHROID) 50 MCG tablet Take 1 tablet (50 mcg total) by mouth daily. (Patient taking differently: Take 50 mcg by mouth daily before breakfast. ) 90 tablet 3  . olmesartan (BENICAR) 40 MG tablet Take 40 mg by mouth daily.    [name] Kitchen omeprazole (PRILOSEC) 20 MG capsule TAKE 1 CAPSULE(20 MG) BY MOUTH DAILY (Patient taking differently: Take 20 mg by mouth daily. ) 90 capsule 4  . pravastatin (PRAVACHOL) 40 MG tablet Take 40 mg by mouth at bedtime.     . SYMBICORT 80-4.5 MCG/ACT inhaler INHALE 2 PUFFS INTO THE LUNGS TWICE DAILY (Patient taking differently: Inhale 2 puffs into the lungs in the morning and at bedtime. ) 10.2 g 3  . traMADol (ULTRAM) 50 MG tablet Take 1 tablet (50 mg total) by mouth every 6 (six) hours as needed for moderate pain. 30 tablet 0  . vitamin B-12 (CYANOCOBALAMIN) 1000 MCG tablet Take 1,000 mcg by mouth 3 (three) times a week. MWF    . warfarin (COUMADIN) 2.5 MG tablet Take 1/2-1 tablet by mouth daily as directed by [facility] (Patient taking differently: Take 1.25-2.5 mg by mouth See admin instructions. Take 1 tablet (2.5 mg)  Tuesday Thursday & Saturday Sunday Take 0.5 tablet (1.25 mg) Monday. Wednesday & Friday) 90 tablet 1  . zinc gluconate 50 MG tablet Take 50 mg by mouth daily.      Allergies as of 02/08/2020 - Review Complete 02/08/2020  Allergen Reaction Noted  . Doxycycline Other (See Comments) 09/07/2014  . Flagyl [metronidazole] Nausea And Vomiting 11/14/2011  . Moxifloxacin Other (See Comments) 11/01/2008  . Pantoprazole sodium Rash 11/01/2008  . Penicillins  Rash 11/01/2008    Family History  Problem Relation Age of Onset  . Hypertension Mother   . Stroke Mother   . Emphysema Brother   . Other Father        miner's lung  . Cancer Father        Lung  . Colon polyps Sister   . Pancreatic cancer Sister   . Kidney disease Sister   . Atrial fibrillation Other        siblings    Social History   Socioeconomic History  . Marital status: Married    Spouse name: Not on file  . Number of  children: 2  . Years of education: Not on file  . Highest education level: Not on file  Occupational History  . Occupation: retired Product manager: [employer]  Tobacco Use  . Smoking status: Never Smoker  . Smokeless tobacco: Never Used  Vaping Use  . Vaping Use: Never used  Substance and Sexual Activity  . Alcohol use: No  . Drug use: No  . Sexual activity: Not on file    Comment: Hysterectomy  Other Topics Concern  . Not on file  Social History Narrative   2 children 4 grandchildren   Recently moved to [place] from [place]   Retired Pharmacist, hospital   Grew up in [place]. Moved to [place]. Moved to [place] Feb 2010 due to duaghther living in [place]. Son lives in [place]. Husband works as Scientist, physiological in [place].   Social Determinants of Health   Financial Resource Strain:   . Difficulty of Paying Living Expenses:   Food Insecurity:   . Worried About Charity fundraiser in the Last Year:   . Arboriculturist in the Last Year:   Transportation Needs:   . Film/video editor (Medical):   [name] Kitchen Lack of Transportation (Non-Medical):   Physical Activity:   . Days of Exercise per Week:   . Minutes of Exercise per Session:   Stress:   . Feeling of Stress :   Social Connections:   . Frequency of Communication with Friends and Family:   . Frequency of Social Gatherings with Friends and Family:   . Attends Religious Services:   . Active Member of Clubs or Organizations:   . Attends Archivist Meetings:   [name] Kitchen  Marital Status:   Intimate Partner Violence:   . Fear of Current or Ex-Partner:   . Emotionally Abused:   [name] Kitchen Physically Abused:   . Sexually Abused:     Review of Systems: Mild dysphagia. All other reviewed and negative except where noted in HPI.  Physical Exam: Vital signs in last 24 hours: Temp:  [97.3 F (36.3 C)-97.5 F (36.4 C)] 97.5 F (36.4 C) (08/09 1259) Pulse Rate:  [30-124] 112 (08/09 1325) Resp:  [17-36] 22 (08/09 1430) BP: (88-157)/(67-87) 119/83 (08/09 1430) SpO2:  [95 %-98 %] 96 % (08/09 1431) Weight:  [74.8 kg] 74.8 kg (08/09 1143)   General:   Alert, well-developed,  female in NAD Psych:  Pleasant, cooperative. Normal mood and affect. Eyes:  Pupils equal, sclera clear, no icterus.   Conjunctiva pink. Ears:  Normal auditory acuity. Nose:  No deformity, discharge,  or lesions. Neck:  Supple; no masses Lungs:  Clear throughout to auscultation.   Decreased breath sounds bilaterally but breathing is shallow. .  Heart:  Tachycardic, irreg rhythm; no lower extremity edema Abdomen:  Soft, mildly distended. Significantly tender in whole mid to lower abdomen. Hypoactive bowel sounds.   Rectal:  Deferred  Msk:  Symmetrical without gross deformities. . Neurologic:  Alert and  oriented x4;  grossly normal neurologically. Skin:  Intact without significant lesions or rashes.   Intake/Output from previous day: No intake/output data recorded. Intake/Output this shift: No intake/output data recorded.  Lab Results: Recent Labs    02/08/20 1150  WBC 18.5*  HGB 13.9  HCT 44.8  PLT 513*   BMET Recent Labs    02/08/20 1150  NA 132*  K 3.8  CL 95*  CO2 20*  GLUCOSE 175*  BUN 7*  CREATININE 0.86  CALCIUM  9.0   LFT Recent Labs    02/08/20 1150  PROT 6.8  ALBUMIN 2.9*  AST 20  ALT 11  ALKPHOS 114  BILITOT 0.8   PT/INR Recent Labs    02/08/20 1246  LABPROT 32.2*  INR 3.3*   Hepatitis Panel No results for input(s): HEPBSAG, HCVAB, HEPAIGM,  HEPBIGM in the last 72 hours.   . CBC Latest Ref Rng & Units 02/08/2020 11/18/2019 11/17/2019  WBC 4.0 - 10.5 K/uL 18.5(H) 12.5(H) 8.3  Hemoglobin 12.0 - 15.0 g/dL 13.9 10.5(L) 11.8(L)  Hematocrit 36 - 46 % 44.8 32.8(L) 37.1  Platelets 150 - 400 K/uL 513(H) 323 354    . CMP Latest Ref Rng & Units 02/08/2020 11/17/2019 11/16/2019  Glucose 70 - 99 mg/dL 175(H) 153(H) 107(H)  BUN 8 - 23 mg/dL 7(L) 12 13  Creatinine 0.44 - 1.00 mg/dL 0.86 0.65 0.65  Sodium 135 - 145 mmol/L 132(L) 131(L) 133(L)  Potassium 3.5 - 5.1 mmol/L 3.8 4.9 4.6  Chloride 98 - 111 mmol/L 95(L) 99 102  CO2 22 - 32 mmol/L 20(L) 22 24  Calcium 8.9 - 10.3 mg/dL 9.0 8.8(L) 8.9  Total Protein 6.5 - 8.1 g/dL 6.8 - 5.7(L)  Total Bilirubin 0.3 - 1.2 mg/dL 0.8 - 0.4  Alkaline Phos 38 - 126 U/L 114 - 149(H)  AST 15 - 41 U/L 20 - 24  ALT 0 - 44 U/L 11 - 18   Studies/Results: No results found.  Active Problems:   * No active hospital problems. Tye Savoy, NP-C @  02/08/2020, 2:46 PM

## 2020-02-08 NOTE — H&P (View-Only) (Signed)
[facility] Surgery Consult Note  Stephanie Cortez [birth year]  [id number].    Requesting MD: Lysbeth Galas Weekly Chief Complaint/Reason for Consult: ischemic small bowel  HPI:  Stephanie Cortez is an 86yo PMH A fib on coumadin (INR 3.3), interstitial lung disease on nighttime oxygen, HTN, HLD, hypothyroidism, CHF (EF 60-65% 10/2019), who presented to [facility] earlier today complaining of 4 days of worsening abdominal pain. Pain is now diffuse. It is constant and severe. Associated with multiple episodes of n/v. Thought she was constipated and used an enema yesterday with no benefit. Denies fever or chills. States that she has never had pain like this before.  In the ED she was found to have HR 120s-130s and irregular.  BP initially 100s/70s however had numerous episodes of hypotension w/ lowest BP 70s/40s. CBC 18.5, lactic acid 2.4, hgb 13.9, INR 3.3. She was given 5mg  vitamin K.  CTA shows small bowel ischemia within the mid and lower abdomen, demonstrating edematous, nonenhancing pattern; favored to be secondary to mesenteric adhesions/torsion, and not from thromboembolic arterial disease. General surgery asked to see. Covid negative.  Abdominal surgical history: total abdominal hysterectomy Nonsmoker Lived at home independently until hip surgery 10/2019, daughter now lives with her Ambulates with walker  Review of Systems  Constitutional: Negative.   Respiratory: Positive for shortness of breath. Negative for wheezing.   Cardiovascular: Negative for chest pain.  Gastrointestinal: Positive for abdominal pain, constipation, nausea and vomiting.  Genitourinary: Negative.   Musculoskeletal: Positive for joint pain.   All systems reviewed and otherwise negative except for as above  Family History  Problem Relation Age of Onset  . Hypertension Mother   . Stroke Mother   . Emphysema Brother   . Other Father        miner's lung  . Cancer Father        Lung  . Colon polyps Sister   .  Pancreatic cancer Sister   . Kidney disease Sister   . Atrial fibrillation Other        siblings    Past Medical History:  Diagnosis Date  . Anemia    - Hgb 9.7gm% on 07/13/2008 in [place] -  Hgg 129gm% wiht normal irone levsl and ferritin 10/27/2008 in [place] Recurrent otitis/sinusitis  . Anxiety    chronic BZ prn  . Atrial fibrillation (HCC)    chronic anticoag  . Bronchiectasis    >PFT 07/13/2008 in [place] 1.9L/76%, FVC 2.45L/74, Ratio 79, TLC 121%, DLCO 64%  AE BRonchiectasis - Dec 2010.New Rx:  outpatient - Feb 2011 - Rx outpatient  . CHF (congestive heart failure) ([place])   . COPD (chronic obstructive pulmonary disease) (HCC)    bronchiectasis  . Cricopharyngeal achalasia   . Depression   . Diverticulosis   . Dyslipidemia   . Eczema   . GERD with stricture   . Glaucoma   . H. pylori infection   . Hypertension   . Hyponatremia    chronic, s/p endo eval 06/2012  . Hypothyroid   . IBS (irritable bowel syndrome)   . Lumbar disc disease   . Neuropathy of both feet   . Segmental colitis ([place])   . Vertigo   . Wears glasses     Past Surgical History:  Procedure Laterality Date  . BREAST SURGERY     br bx  . CARDIAC CATHETERIZATION  07/01/2013  . CATARACT EXTRACTION     both  . COLONOSCOPY    . ESOPHAGOSCOPY W/ BOTOX INJECTION  12/11/2011   Procedure: ESOPHAGOSCOPY WITH BOTOX INJECTION;  Surgeon: Rozetta Nunnery, MD;  Location: [facility];  Service: ENT;  Laterality: N/A;  esophogoscopy with dilation, botox injection  . FOOT SURGERY  03/14/2011   gastroc slide-rt  . HIP ARTHROPLASTY Right 10/28/2019   Procedure: ARTHROPLASTY BIPOLAR HIP (HEMIARTHROPLASTY);  Surgeon: Marybelle Killings, MD;  Location: [facility];  Service: Orthopedics;  Laterality: Right;  . TOTAL ABDOMINAL HYSTERECTOMY    . TOTAL HIP REVISION Right 11/16/2019   Procedure: TOTAL HIP REVISION BIPOLAR TO CEMENTED BIPOLAR;  Surgeon: Marybelle Killings, MD;  Location: [facility];  Service: Orthopedics;   Laterality: Right;  . WISDOM TOOTH EXTRACTION      Social History:  reports that she has never smoked. She has never used smokeless tobacco. She reports that she does not drink alcohol and does not use drugs.  Allergies:  Allergies  Allergen Reactions  . Doxycycline Other (See Comments)    Rash on face and neck  . Vicodin [Hydrocodone-Acetaminophen]     Hallucinations   . Flagyl [Metronidazole] Nausea And Vomiting  . Moxifloxacin Other (See Comments)     Weakness/fatigue  . Pantoprazole Sodium Rash  . Penicillins Rash    Has patient had a PCN reaction causing immediate rash, facial/tongue/throat swelling, SOB or lightheadedness with hypotension:unsure Has patient had a PCN reaction causing severe rash involving mucus membranes or skin necrosis:unsure Has patient had a PCN reaction that required hospitalization:No Has patient had a PCN reaction occurring within the last 10 years:Yes Cannot recall exact reaction due to time lapse If all of the above answers are "NO", then may proceed with Cephalosporin use.     (Not in a hospital admission)   Prior to Admission medications   Medication Sig Start Date End Date Taking? Authorizing Provider  Biotin 5000 MCG TABS Take 5,000 mcg by mouth daily.   Yes [provider]  Cholecalciferol (VITAMIN D-3) 125 MCG (5000 UT) TABS Take 5,000 Units by mouth daily.    Yes [provider]  FLUoxetine (PROZAC) 10 MG tablet Take 10 mg by mouth daily.    Yes [provider]  folic acid (FOLVITE) 454 MCG tablet Take 1 mg by mouth 3 (three) times a week. MWF   Yes [provider]  levothyroxine (SYNTHROID, LEVOTHROID) 50 MCG tablet Take 1 tablet (50 mcg total) by mouth daily. Patient taking differently: Take 50 mcg by mouth daily before breakfast.  08/12/13  Yes Rowe Clack, MD  methocarbamol (ROBAXIN) 500 MG tablet Take 500 mg by mouth 2 (two) times daily. 01/25/20  Yes [provider]  pravastatin  (PRAVACHOL) 40 MG tablet Take 40 mg by mouth at bedtime.  10/03/16  Yes [provider]  SYMBICORT 80-4.5 MCG/ACT inhaler INHALE 2 PUFFS INTO THE LUNGS TWICE DAILY Patient taking differently: Inhale 2 puffs into the lungs in the morning and at bedtime.  06/17/19  Yes Brand Males, MD  traMADol (ULTRAM) 50 MG tablet Take 1 tablet (50 mg total) by mouth every 6 (six) hours as needed for moderate pain. 11/20/19  Yes Marybelle Killings, MD  vitamin B-12 (CYANOCOBALAMIN) 1000 MCG tablet Take 1,000 mcg by mouth 3 (three) times a week. MWF   Yes [provider]  warfarin (COUMADIN) 2.5 MG tablet Take 1/2-1 tablet by mouth daily as directed by [facility] Patient taking differently: Take 1.25 mg by mouth one time only at 4 PM.  01/27/16  Yes Deboraha Sprang, MD  zinc  gluconate 50 MG tablet Take 50 mg by mouth daily.   Yes [provider]  bisoprolol (ZEBETA) 10 MG tablet Take 1 tablet by mouth daily. Please monitor your blood pressure and heart rate daily for a couple of weeks to make sure they remain stable. Patient not taking: Reported on 02/08/2020 07/20/19   Deboraha Sprang, MD  diltiazem (CARDIZEM CD) 240 MG 24 hr capsule Take 1 capsule (240 mg total) by mouth daily. Patient not taking: Reported on 02/08/2020 02/16/19   Shirley Friar, PA-C  docusate sodium (COLACE) 100 MG capsule Take 1 capsule (100 mg total) by mouth 2 (two) times daily. Patient not taking: Reported on 02/08/2020 10/30/19   Harold Hedge, MD  omeprazole (PRILOSEC) 20 MG capsule TAKE 1 CAPSULE(20 MG) BY MOUTH DAILY Patient not taking: Reported on 02/08/2020 11/07/15   Pyrtle, Lajuan Lines, MD    Blood pressure 117/76, pulse (!) 103, temperature (!) 97.5 F (36.4 C), temperature source Oral, resp. rate (!) 27, height 5\' 6"  (1.676 m), weight 74.8 kg, SpO2 100 %. Physical Exam: General: pleasant, WD/WN white female who is laying in bed in NAD HEENT: head is normocephalic, atraumatic.  Sclera are noninjected.  PERRL.   Ears and nose without any masses or lesions.  Mouth is pink and moist. Dentition fair Heart: irregularly irregular, HR 120's bpm  Normal s1,s2. No obvious murmurs, gallops, or rubs noted.  Feet WWP Lungs: CTAB, no wheezes, rhonchi, or rales noted.  Respiratory effort nonlabored Abd: soft, distended, hypoactive BS, no masses, hernias, or organomegaly. Diffuse tenderness with guarding MS: trace BLE edema, calves soft and nontender Skin: warm and dry with no masses, lesions, or rashes Psych: A&Ox3 with an appropriate affect, some repetitive questioning Neuro: cranial nerves grossly intact, equal strength in BUE/BLE bilaterally, normal speech  Results for orders placed or performed during the hospital encounter of 02/08/20 (from the past 48 hour(s))  Comprehensive metabolic panel     Status: Abnormal   Collection Time: 02/08/20 11:50 AM  Result Value Ref Range   Sodium 132 (L) 135 - 145 mmol/L   Potassium 3.8 3.5 - 5.1 mmol/L   Chloride 95 (L) 98 - 111 mmol/L   CO2 20 (L) 22 - 32 mmol/L   Glucose, Bld 175 (H) 70 - 99 mg/dL    Comment: Glucose reference range applies only to samples taken after fasting for at least 8 hours.   BUN 7 (L) 8 - 23 mg/dL   Creatinine, Ser 0.86 0.44 - 1.00 mg/dL   Calcium 9.0 8.9 - 10.3 mg/dL   Total Protein 6.8 6.5 - 8.1 g/dL   Albumin 2.9 (L) 3.5 - 5.0 g/dL   AST 20 15 - 41 U/L   ALT 11 0 - 44 U/L   Alkaline Phosphatase 114 38 - 126 U/L   Total Bilirubin 0.8 0.3 - 1.2 mg/dL   GFR calc non Af Amer >60 >60 mL/min   GFR calc Af Amer >60 >60 mL/min   Anion gap 17 (H) 5 - 15    Comment: Performed at [facility] [address], [place] [address]  CBC     Status: Abnormal   Collection Time: 02/08/20 11:50 AM  Result Value Ref Range   WBC 18.5 (H) 4.0 - 10.5 K/uL   RBC 4.87 3.87 - 5.11 MIL/uL   Hemoglobin 13.9 12.0 - 15.0 g/dL   HCT 44.8 36 - 46 %   MCV 92.0 80.0 - 100.0 fL  MCH 28.5 26.0 - 34.0 pg   MCHC 31.0 30.0 - 36.0 g/dL   RDW 15.0  11.5 - 15.5 %   Platelets 513 (H) 150 - 400 K/uL   nRBC 0.0 0.0 - 0.2 %    Comment: Performed at [facility], [place] [address] [place], [place] [address]  Type and screen [place]     Status: None (Preliminary result)   Collection Time: 02/08/20 11:50 AM  Result Value Ref Range   ABO/RH(D) O POS    Antibody Screen NEG    Sample Expiration      02/11/2020,2359 Performed at [facility], [place] [address] [place], [place] [address]    Unit Number [id number]    Blood Component Type RBC LR PHER1    Unit division 00    Status of Unit ISSUED    Unit tag comment EMERGENCY RELEASE    Transfusion Status OK TO TRANSFUSE    Crossmatch Result COMPATIBLE    Unit Number [id number]    Blood Component Type RBC LR PHER2    Unit division 00    Status of Unit ISSUED    Unit tag comment EMERGENCY RELEASE    Transfusion Status OK TO TRANSFUSE    Crossmatch Result COMPATIBLE   POC occult blood, ED     Status: Abnormal   Collection Time: 02/08/20 12:07 PM  Result Value Ref Range   Fecal Occult Bld POSITIVE (A) NEGATIVE  Prepare RBC (crossmatch)     Status: None   Collection Time: 02/08/20 12:38 PM  Result Value Ref Range   Order Confirmation      ORDER PROCESSED BY BLOOD BANK Performed at [facility], [place] [address] [place], [place] [address]   Protime-INR     Status: Abnormal   Collection Time: 02/08/20 12:46 PM  Result Value Ref Range   Prothrombin Time 32.2 (H) 11.4 - 15.2 seconds   INR 3.3 (H) 0.8 - 1.2    Comment: (NOTE) INR goal varies based on device and disease states. Performed at [facility], [place] [address], [place], [place] [address]   Lactic acid, plasma     Status: Abnormal   Collection Time: 02/08/20 12:50 PM  Result Value Ref Range   Lactic Acid, Venous 2.4 (HH) 0.5 - 1.9 mmol/L    Comment: CRITICAL RESULT CALLED TO, READ BACK BY AND VERIFIED WITH: L CHILTON RN [id number] [phone] BY A BENNETT Performed at [place], [place] [address], [place],  [address]   SARS Coronavirus 2 by RT PCR (hospital order, performed in [facility] hospital lab) Nasopharyngeal Nasopharyngeal Swab     Status: None   Collection Time: 02/08/20  1:26 PM   Specimen: Nasopharyngeal Swab  Result Value Ref Range   SARS Coronavirus 2 NEGATIVE NEGATIVE    Comment: (NOTE) SARS-CoV-2 target nucleic acids are NOT DETECTED.  The SARS-CoV-2 RNA is generally detectable in upper and lower respiratory specimens during the acute phase of infection. The lowest concentration of SARS-CoV-2 viral copies this assay can detect is 250 copies / mL. A negative result does not preclude SARS-CoV-2 infection and should not be used as the sole basis for treatment or other patient management decisions.  A negative result may occur with improper specimen collection / handling, submission of specimen other than nasopharyngeal swab, presence of viral mutation(s) within the areas targeted by this assay, and inadequate number of viral copies (<250 copies / mL). A negative  result must be combined with clinical observations, patient history, and epidemiological information.  Fact Sheet for Patients:   StrictlyIdeas.no  Fact Sheet for Healthcare Providers: BankingDealers.co.za  This test is not yet approved or  cleared by the [place] FDA and has been authorized for detection and/or diagnosis of SARS-CoV-2 by FDA under an Emergency Use Authorization (EUA).  This EUA will remain in effect (meaning this test can be used) for the duration of the COVID-19 declaration under Section 564(b)(1) of the Act, 21 U.S.C. section 360bbb-3(b)(1), unless the authorization is terminated or revoked sooner.  Performed at [facility], [place] [address], [place], [place] [address]   Lactic acid, plasma     Status: Abnormal   Collection Time: 02/08/20  2:49 PM  Result Value Ref Range   Lactic Acid, Venous 2.4  (HH) 0.5 - 1.9 mmol/L    Comment: CRITICAL VALUE NOTED.  VALUE IS CONSISTENT WITH PREVIOUSLY REPORTED AND CALLED VALUE. Performed at [facility], [place] [address] [place], [place] [address]    CT Angio Abd/Pel W and/or Wo Contrast  Result Date: 02/08/2020 CLINICAL DATA:  86 year old female with abdominal pain and possible GI bleeding. EXAM: CTA ABDOMEN AND PELVIS WITHOUT AND WITH CONTRAST TECHNIQUE: Multidetector CT imaging of the abdomen and pelvis was performed using the standard protocol during bolus administration of intravenous contrast. Multiplanar reconstructed images and MIPs were obtained and reviewed to evaluate the vascular anatomy. CONTRAST:  119mL OMNIPAQUE IOHEXOL 350 MG/ML SOLN COMPARISON:  10/25/2011 FINDINGS: VASCULAR Aorta: Mild atherosclerotic changes of the thoracic aorta and mild to moderate atherosclerosis of the abdominal aorta. No dissection. No periaortic fluid. Celiac: Celiac artery patent at the origin without high-grade stenosis. Mild atherosclerosis. Branches are patent and of relatively normal caliber. 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 SMA: The SMA is patent at the origin with minimal atherosclerosis. SMA tapers to very small caliber within the mesentery. There is no occlusion identified on the CT angiogram. Renals: - Right: Right renal artery patent with minimal atherosclerosis. Coronal images demonstrates some beading pattern of the right renal artery. - Left: Left renal artery patent with minimal atherosclerosis. Coronal images demonstrate questionable subtle beading pattern of the left renal artery. IMA: IMA is patent Right lower extremity: Unremarkable course, caliber, and contour of the right iliac system. Mild atherosclerosis. No aneurysm, dissection, or occlusion. Hypogastric artery is patent. Common femoral artery patent. Proximal SFA and profunda femoris patent. Left lower extremity:  Unremarkable course, caliber, and contour of the left iliac system. Mild atherosclerosis. No aneurysm, dissection, or occlusion. Hypogastric artery is patent. Common femoral artery patent. Proximal SFA and profunda femoris patent. Veins: Portal system: Portal venous system patent. No esophageal varices. No evidence of gastric varices. Splenic vein patent of small caliber. The SMV is patent at the confluence with the portal vein/splenic vein. Distal to the confluence, SMV and the SMV branches are completely decompressed and essentially non visible. Review of the MIP images confirms the above findings. NON-VASCULAR Lower chest: Emphysematous changes at the lung bases. Hepatobiliary: Unremarkable appearance of the liver. Unremarkable gall bladder. Pancreas: Unremarkable. Spleen: I few punctate calcifications within the splenic parenchyma possibly secondary to prior granulomatous disease. Adrenals/Urinary Tract: - Right adrenal gland: Unremarkable - Left adrenal gland: Unremarkable. - Right kidney: No hydronephrosis, nephrolithiasis, inflammation, or ureteral dilation. Small hypoechoic/nonenhancing lesions which are too small to characterize. - Left Kidney: No hydronephrosis, nephrolithiasis, inflammation, or ureteral dilation. Small  hypoenhancing hypodense cystic lesions which are too small to characterize. - Urinary Bladder: Unremarkable. Stomach/Bowel: - Stomach: Hiatal hernia. Stomach is decompressed. No fluid fluid level. No accumulation of contrast to suggest extravasation. No focal wall thickening. Mucosa of the stomach enhances within normal limits. - Small bowel: Proximal small bowel decompressed with typical enhancement pattern expected within the small bowel on the venous phase. There is a long segment of small bowel within the mid and lower abdomen demonstrating borderline dilation, extensive edema within the mesentery, and non enhancement on the arterial and venous phases. Single loop within the mid abdomen  anterior to the sacral base on image 131 of series 17 demonstrate is differential enhancement, with enhancement proximally and non enhancement within the adjacent segment. Presumed transition point within the bowel on image 125 of series 17, right abdomen. There is no evidence of accumulation of high density fluid within small bowel that would suggest active extravasation. - Appendix: Appendix not visualized. - Colon: Terminal ileum at the IC valve is decompressed. Proximal colon appears to enhance within normal limits without focal inflammatory changes. Some retained high density material within the transverse colon. Diverticular disease without focal inflammatory changes or distention of the left colon and sigmoid colon. Lymphatic: No lymphadenopathy. Mesenteric: Trace fluid measuring 20 Hounsfield units adjacent to the spleen and the liver. Trace fluid within the pericolic gutter bilaterally. Extensive edema within the small bowel mesentery associated with a nonenhancing bowel loops. Trace free fluid within the anatomic pelvis. No free air. No portal venous gas. Reproductive: Changes of hysterectomy Other: Small fat containing umbilical hernia. Musculoskeletal: Surgical changes of right hip arthroplasty with significant streak artifact limiting evaluation of the pelvis. Degenerative changes of the left hip. Degenerative changes of the visualized thoracolumbar spine. No acute displaced fracture. Osteopenia. IMPRESSION: Small bowel ischemia within the mid and lower abdomen, demonstrating edematous, nonenhancing pattern. This is favored to be secondary to mesenteric adhesions/torsion, and not from thromboembolic arterial disease. Surgical consultation is indicated. CT is negative for evidence of acute gastrointestinal hemorrhage. Small volume reactive ascites. These above preliminary results were discussed by telephone at the time of interpretation on 02/08/2020 at 2:17 pm with Dr. Herbie Baltimore LOCKWOOD Minimal aortic  atherosclerosis. Aortic Atherosclerosis (ICD10-I70.0). No significant mesenteric arterial disease. Subtle beading changes of the bilateral renal arteries, right greater than left, which may indicate fibromuscular dysplasia. 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 Additional ancillary findings as above. Signed, Dulcy Fanny. Dellia Nims, RPVI [facility] Radiology Electronically Signed   By: Corrie Mckusick D.O.   On: 02/08/2020 14:48   Anti-infectives (From admission, onward)   Start     Dose/Rate Route Frequency Ordered Stop   02/08/20 1530  cefTRIAXone (ROCEPHIN) 2 g in sodium chloride 0.9 % 100 mL IVPB     Discontinue     2 g 200 mL/hr over 30 Minutes Intravenous Every 24 hours 02/08/20 1502     02/08/20 1515  cefTRIAXone (ROCEPHIN) 2 g in sodium chloride 0.9 % 100 mL IVPB  Status:  Discontinued        2 g 200 mL/hr over 30 Minutes Intravenous Every 24 hours 02/08/20 1508 02/08/20 1512   02/08/20 1500  piperacillin-tazobactam (ZOSYN) IVPB 2.25 g  Status:  Discontinued        2.25 g 100 mL/hr over 30 Minutes Intravenous  Once 02/08/20 1453 02/08/20 1502        Assessment/Plan A fib on coumadin (INR 3.3) Interstitial lung  disease on nighttime oxygen HTN HLD Hypothyroidism CHF (EF 60-65% 10/2019)  Ischemic small bowel - Patient with peritonitis on exam and ischemic small bowel seen on CTA. Recommend urgent surgery tonight for exploratory laparotomy, bowel resection, possible gastrostomy tube placement. Patient is agreeable. Her INR is 3.3. Will give Eppie Gibson for reversal and plan to take to surgery once this is in. Hold coumadin. Covid test is negative. Keep NPO.   Wellington Hampshire, [facility] 02/08/2020, 3:45 PM Please see Amion for pager number during day hours 7:00am-4:30pm

## 2020-02-08 NOTE — Progress Notes (Addendum)
Patient arrived to floor feeling nauseous, asking for emesis bag. Patient started vomiting black moderate amount of black emesis. Connected wall suction and provided patient with yankeuer. Placed patient on cardiac monitor and found patient to be in atrial fibrillation with RVR with a rate of 155 bpm. Anesthesia made aware. Ordered Cardizem drip. CRNA made aware.

## 2020-02-08 NOTE — Interval H&P Note (Signed)
History and Physical Interval Note:  02/08/2020 5:26 PM  Stephanie Cortez  has presented today for surgery, with the diagnosis of MESENTRIC ISCHEMIA.  The various methods of treatment have been discussed with the patient and family. After consideration of risks, benefits and other options for treatment, the patient has consented to  Procedure(s): EXPLORATORY LAPAROTOMY (N/A) SMALL BOWEL RESECTION (N/A) POSSIBLE INSERTION OF GASTROSTOMY TUBE (N/A) as a surgical intervention.  The patient's history has been reviewed, patient examined, no change in status, stable for surgery.  I have reviewed the patient's chart and labs.  Questions were answered to the patient's satisfaction.    I have assumed her care from Dr. Donne [name].  Discussed with the patient and her son-in-law.    [name]

## 2020-02-08 NOTE — ED Notes (Signed)
Louie - RN aware of pt's POSITIVE occult blood result. RN will inform MD.

## 2020-02-08 NOTE — Anesthesia Procedure Notes (Signed)
Arterial Line Insertion Start/End8/03/2020 6:00 PM, 02/08/2020 6:10 PM Performed by: Catalina Gravel, MD, anesthesiologist  Patient location: OR. Preanesthetic checklist: patient identified, IV checked, site marked, risks and benefits discussed, surgical consent, monitors and equipment checked, pre-op evaluation, timeout performed and anesthesia consent Lidocaine 1% used for infiltration Left, radial was placed Catheter size: 20 Fr Hand hygiene performed  and maximum sterile barriers used   Attempts: 1 Procedure performed using ultrasound guided technique. Ultrasound Notes:anatomy identified, needle tip was noted to be adjacent to the nerve/plexus identified, no ultrasound evidence of intravascular and/or intraneural injection and image(s) printed for medical record Following insertion, dressing applied and Biopatch. Post procedure assessment: normal and unchanged  Patient tolerated the procedure well with no immediate complications.

## 2020-02-08 NOTE — Op Note (Signed)
Preop diagnosis: Peritonitis, ischemic bowel, previous achalasia Postop diagnosis: Same Procedure performed: Exploratory laparotomy, lysis of adhesions, small bowel resection, gastrostomy tube placement  Surgeon:Bastien Strawser K Erabella Kuipers Anesthesia: General Indications: This is an 86 year old female anticoagulated on Coumadin for atrial fibrillation, history of interstitial lung disease on home oxygen, hypertension, hyperlipidemia, congestive heart failure, recent hip surgery x2, who presents with several days of worsening abdominal pain.  She finally presented to the emergency department with peritonitis, nausea, and vomiting.  She was profoundly tachycardic in atrial fibrillation.  White count is elevated.  Lactic acid is mildly elevated.  INR is 3.3.  CT angiogram showed what appears to be small bowel ischemia in the mid lower abdomen, likely secondary to torsion of the mesentery.  She was given Kcentra and we proceeded directly to the operating room.  Description of procedure: Patient is brought to the operating room placed in the supine position on the operating room table.  After an adequate level of general anesthesia was obtained, a Foley catheter was placed under sterile technique.  The patient's abdomen was prepped with ChloraPrep and draped in sterile fashion.  A timeout was taken to ensure the proper patient and proper procedure.  I made a vertical midline incision including part of her old lower midline scar.  Dissection was carried down to the fascia.  We entered the peritoneal cavity sharply.  We encountered a large amount of bloody ascites.  The ascites was evacuated and suctioned out.  We then open the fascia widely.  There is a large amount of frankly dead ischemic small bowel.  There is no sign of perforation.  We placed the Balfour retractor.  I was able to bluntly lyse some adhesions on the right side and was able to untwist the bowel.  We delivered this out of the abdomen.  I divided proximal and  distal to the ischemic segment with GIA-75 staplers.  The mesentery was divided with the LigaSure device.  The specimen was passed off the field.  We inspected for hemostasis.  There are no other areas of ischemic small bowel.  There are approximately 30 cm of terminal ileum remaining leading to the ileocecal valve.  There is approximately 120 cm of jejunum distal to the ligament of Treitz.  I identified the cecum.  The ascending colon transverse colon and descending colon all appear to be normal.  There is hard stool within the distal colon but no sign of ischemia or perforation.  The stomach appears grossly normal.  The liver appears normal.  We created cytocide small bowel anastomosis with a GIA 75 stapler.  The common defect was closed with a TA 60 stapler.  The mesentery was closed with 3-0 silk sutures.  A crotch stitch of 3-0 silk was also placed.  The anastomosis was widely patent and appeared to be viable.  We again inspected for hemostasis.  The abdomen was thoroughly irrigated.  Due to her history of achalasia and swallowing difficulties, we had previously discussed placing a gastrostomy tube to help with enteral nutrition.  I created a small stab incision below the left costal margin.  An 35 French gastrostomy tube was inserted through the stab incision into the peritoneal cavity.  A pursestring suture of 3-0 silk was placed in the anterior wall of the stomach just proximal to the antrum.  We created a gastrotomy here and inserted the gastrostomy tube.  The balloon was inflated with 10 cc of saline.  The pursestring suture was tied down.  There is  no sign of leak.  We then secured the stomach to the anterior abdominal wall with multiple 3-0 silk sutures.  The flange of the gastrostomy tube was secured to the skin with 2-0 nylon sutures.  The gastrostomy tube was connected to a catheter bag and left to gravity drain.  We then had a accurate sponge and instrument count.  The fascia was then  reapproximated with multiple interrupted figure-of-eight #1 Novafil sutures.  We irrigated the subcutaneous tissues.  Staples were used to close the skin.  A clean dressing was applied.  The gastrostomy tube was placed to straight drain.  The patient was then extubated and brought to the recovery room in stable condition.  All sponge, instrument, and needle counts are correct.  Imogene Burn. Georgette Dover, MD, [facility] Surgery  General/ Trauma Surgery   02/08/2020 8:13 PM

## 2020-02-08 NOTE — Anesthesia Procedure Notes (Signed)
Procedure Name: Intubation Date/Time: 02/08/2020 5:50 PM Performed by: Sammie Bench, CRNA Pre-anesthesia Checklist: Patient identified, Emergency Drugs available, Suction available and Patient being monitored Patient Re-evaluated:Patient Re-evaluated prior to induction Oxygen Delivery Method: Circle System Utilized Preoxygenation: Pre-oxygenation with 100% oxygen Induction Type: IV induction, Rapid sequence and Cricoid Pressure applied Ventilation: Mask ventilation without difficulty Laryngoscope Size: Mac and 3 Grade View: Grade I Tube type: Oral Number of attempts: 1 Airway Equipment and Method: Stylet and Oral airway Placement Confirmation: ETT inserted through vocal cords under direct vision,  positive ETCO2 and breath sounds checked- equal and bilateral Secured at: 21 cm Tube secured with: Tape Dental Injury: Teeth and Oropharynx as per pre-operative assessment

## 2020-02-08 NOTE — ED Provider Notes (Signed)
[name] EMERGENCY DEPARTMENT Provider Note   CSN: [id number] Arrival date & time: 02/08/20  1133     History Chief Complaint  Patient presents with  . Abdominal Pain  . Diarrhea  . Nausea    Stephanie Cortez is a 86 y.o. female.  The history is provided by the patient and the EMS personnel.  Abdominal Pain Pain location:  Generalized Pain quality: aching   Pain radiates to:  Does not radiate Pain severity:  Severe Onset quality:  Gradual Duration:  4 days Timing:  Constant Progression:  Worsening Chronicity:  New Relieved by:  Nothing Worsened by:  Nothing Ineffective treatments:  None tried Associated symptoms: fatigue and nausea   Associated symptoms: no chest pain, no chills, no cough, no dysuria, no fever, no hematuria, no shortness of breath, no sore throat and no vomiting   Associated symptoms comment:  Coffee ground emesis Diarrhea Associated symptoms: abdominal pain   Associated symptoms: no arthralgias, no chills, no fever and no vomiting    86 year old female with history of anemia, A. fib on warfarin who presents with vomiting and abdominal pain.  Patient has been having worsening abdominal pain for the past 4 days associated with dark brown vomit.  Patient had worsening abdominal pain vomiting today which prompted her arrival to the emergency department.  Patient denies history of similar episodes and denies chest pain, shortness of breath, fevers, chills, dysuria, hematuria, hematochezia.    Past Medical History:  Diagnosis Date  . Anemia    - Hgb 9.7gm% on 07/13/2008 in [place] -  Hgg 129gm% wiht normal irone levsl and ferritin 10/27/2008 in [place] Recurrent otitis/sinusitis  . Anxiety    chronic BZ prn  . Atrial fibrillation (HCC)    chronic anticoag  . Bronchiectasis    >PFT 07/13/2008 in [place] 1.9L/76%, FVC 2.45L/74, Ratio 79, TLC 121%, DLCO 64%  AE BRonchiectasis - Dec 2010.New Rx:  outpatient - Feb 2011 - Rx outpatient  . CHF  (congestive heart failure) ([place])   . COPD (chronic obstructive pulmonary disease) (HCC)    bronchiectasis  . Cricopharyngeal achalasia   . Depression   . Diverticulosis   . Dyslipidemia   . Eczema   . GERD with stricture   . Glaucoma   . H. pylori infection   . Hypertension   . Hyponatremia    chronic, s/p endo eval 06/2012  . Hypothyroid   . IBS (irritable bowel syndrome)   . Lumbar disc disease   . Neuropathy of both feet   . Segmental colitis ([place])   . Vertigo   . Wears glasses     Patient Active Problem List   Diagnosis Date Noted  . Sepsis ([place]) 02/08/2020  . Small bowel ischemia ([place]) 02/08/2020  . Thrombocytosis ([place]) 02/08/2020  . Periprosthetic fracture around internal prosthetic hip joint 11/16/2019  . Periprosthetic fracture around internal prosthetic hip joint, initial encounter   . Chronic obstructive pulmonary disease ([place])   . Essential hypertension   . Chronic anticoagulation   . Acute blood loss anemia   . Transaminitis   . Hypoalbuminemia due to protein-calorie malnutrition ([place])   . Labile blood pressure   . Drug induced constipation   . Postoperative pain   . Subcapital fracture of femur ([place]) 10/30/2019  . Post-operative state   . Prerenal azotemia   . Leukocytosis   . Closed right hip fracture, initial encounter ([place]) 10/27/2019  . Hoarseness 02/19/2018  . Sensorineural hearing loss (SNHL)  of both ears 02/19/2018  . Tinnitus, bilateral 02/19/2018  . Cough 09/06/2017  . Cough variant asthma 08/19/2017  . Acute bronchitis 02/20/2016  . Pulmonary air trapping 01/04/2016  . Encounter for preoperative pulmonary examination 01/04/2016  . NSIP (nonspecific interstitial pneumonia) ([place]) 09/26/2015  . Chronic sinusitis 09/26/2015  . ILD (interstitial lung disease) ([place]) 07/12/2014  . Upper airway cough syndrome 09/23/2013  . Encounter for therapeutic drug monitoring 08/11/2013  . Congestive heart failure ([place]) 07/08/2013  . Coronary artery  calcification seen on CAT scan 03/15/2013  . Nodule of left lung 03/15/2013  . Orthostatic lightheadedness 02/09/2013  . Depression   . Hyponatremia 04/03/2012  . Diverticulitis 01/01/2012  . GERD (gastroesophageal reflux disease) 01/01/2012  . Hypothyroid 11/23/2010  . Edema 10/04/2010  . Bronchiectasis ([place])   . HYPERTENSION, BENIGN 10/22/2008  . A-fib [facility])     Past Surgical History:  Procedure Laterality Date  . BREAST SURGERY     br bx  . CARDIAC CATHETERIZATION  07/01/2013  . CATARACT EXTRACTION     both  . COLONOSCOPY    . ESOPHAGOSCOPY W/ BOTOX INJECTION  12/11/2011   Procedure: ESOPHAGOSCOPY WITH BOTOX INJECTION;  Surgeon: Rozetta Nunnery, MD;  Location: [employer];  Service: ENT;  Laterality: N/A;  esophogoscopy with dilation, botox injection  . FOOT SURGERY  03/14/2011   gastroc slide-rt  . HIP ARTHROPLASTY Right 10/28/2019   Procedure: ARTHROPLASTY BIPOLAR HIP (HEMIARTHROPLASTY);  Surgeon: Marybelle Killings, MD;  Location: [facility];  Service: Orthopedics;  Laterality: Right;  . TOTAL ABDOMINAL HYSTERECTOMY    . TOTAL HIP REVISION Right 11/16/2019   Procedure: TOTAL HIP REVISION BIPOLAR TO CEMENTED BIPOLAR;  Surgeon: Marybelle Killings, MD;  Location: [place];  Service: Orthopedics;  Laterality: Right;  . WISDOM TOOTH EXTRACTION       OB History   No obstetric history on file.     Family History  Problem Relation Age of Onset  . Hypertension Mother   . Stroke Mother   . Emphysema Brother   . Other Father        miner's lung  . Cancer Father        Lung  . Colon polyps Sister   . Pancreatic cancer Sister   . Kidney disease Sister   . Atrial fibrillation Other        siblings    Social History   Tobacco Use  . Smoking status: Never Smoker  . Smokeless tobacco: Never Used  Vaping Use  . Vaping Use: Never used  Substance Use Topics  . Alcohol use: No  . Drug use: No    Home Medications Prior to Admission medications   Medication Sig Start  Date End Date Taking? Authorizing Provider  Biotin 5000 MCG TABS Take 5,000 mcg by mouth daily.   Yes [provider]  Cholecalciferol (VITAMIN D-3) 125 MCG (5000 UT) TABS Take 5,000 Units by mouth daily.    Yes [provider]  FLUoxetine (PROZAC) 10 MG tablet Take 10 mg by mouth daily.    Yes [provider]  folic acid (FOLVITE) 226 MCG tablet Take 1 mg by mouth 3 (three) times a week. MWF   Yes [provider]  levothyroxine (SYNTHROID, LEVOTHROID) 50 MCG tablet Take 1 tablet (50 mcg total) by mouth daily. Patient taking differently: Take 50 mcg by mouth daily before breakfast.  08/12/13  Yes Rowe Clack, MD  methocarbamol (ROBAXIN) 500 MG tablet Take 500 mg by mouth  2 (two) times daily. 01/25/20  Yes [provider]  pravastatin (PRAVACHOL) 40 MG tablet Take 40 mg by mouth at bedtime.  10/03/16  Yes [provider]  SYMBICORT 80-4.5 MCG/ACT inhaler INHALE 2 PUFFS INTO THE LUNGS TWICE DAILY Patient taking differently: Inhale 2 puffs into the lungs in the morning and at bedtime.  06/17/19  Yes Brand Males, MD  traMADol (ULTRAM) 50 MG tablet Take 1 tablet (50 mg total) by mouth every 6 (six) hours as needed for moderate pain. 11/20/19  Yes Marybelle Killings, MD  vitamin B-12 (CYANOCOBALAMIN) 1000 MCG tablet Take 1,000 mcg by mouth 3 (three) times a week. MWF   Yes [provider]  warfarin (COUMADIN) 2.5 MG tablet Take 1/2-1 tablet by mouth daily as directed by [facility] Patient taking differently: Take 1.25 mg by mouth one time only at 4 PM.  01/27/16  Yes Deboraha Sprang, MD  zinc gluconate 50 MG tablet Take 50 mg by mouth daily.   Yes [provider]  bisoprolol (ZEBETA) 10 MG tablet Take 1 tablet by mouth daily. Please monitor your blood pressure and heart rate daily for a couple of weeks to make sure they remain stable. Patient not taking: Reported on 02/08/2020 07/20/19   Deboraha Sprang, MD  diltiazem (CARDIZEM  CD) 240 MG 24 hr capsule Take 1 capsule (240 mg total) by mouth daily. Patient not taking: Reported on 02/08/2020 02/16/19   Shirley Friar, PA-C  docusate sodium (COLACE) 100 MG capsule Take 1 capsule (100 mg total) by mouth 2 (two) times daily. Patient not taking: Reported on 02/08/2020 10/30/19   Harold Hedge, MD  omeprazole (PRILOSEC) 20 MG capsule TAKE 1 CAPSULE(20 MG) BY MOUTH DAILY Patient not taking: Reported on 02/08/2020 11/07/15   Jerene Bears, MD    Allergies    Doxycycline, Flagyl [metronidazole], Moxifloxacin, Pantoprazole sodium, and Penicillins  Review of Systems   Review of Systems  Constitutional: Positive for fatigue. Negative for chills and fever.  HENT: Negative for ear pain and sore throat.   Eyes: Negative for pain and visual disturbance.  Respiratory: Negative for cough and shortness of breath.   Cardiovascular: Negative for chest pain and palpitations.  Gastrointestinal: Positive for abdominal pain and nausea. Negative for vomiting.  Genitourinary: Negative for dysuria and hematuria.  Musculoskeletal: Negative for arthralgias and back pain.  Skin: Negative for color change and rash.  Neurological: Negative for seizures and syncope.  All other systems reviewed and are negative.   Physical Exam Updated Vital Signs BP 117/76   Pulse (!) 103   Temp (!) 97.5 F (36.4 C) (Oral)   Resp (!) 27   Ht 5\' 6"  (1.676 m)   Wt 74.8 kg   LMP  (LMP Unknown)   SpO2 100%   BMI 26.63 kg/m   Physical Exam Vitals and nursing note reviewed.  Constitutional:      General: She is not in acute distress.    Appearance: She is well-developed.  HENT:     Head: Normocephalic and atraumatic.     Comments: Dark substance noted on pt's tongue. Eyes:     Conjunctiva/sclera: Conjunctivae normal.  Cardiovascular:     Rate and Rhythm: Tachycardia present. Rhythm irregular.     Heart sounds: No murmur heard.   Pulmonary:     Effort: Pulmonary effort is normal. No  respiratory distress.     Breath sounds: Normal breath sounds.  Abdominal:     General: Abdomen is  flat. Bowel sounds are normal. There is no distension.     Palpations: Abdomen is soft.     Tenderness: There is generalized abdominal tenderness. There is guarding.  Musculoskeletal:     Cervical back: Neck supple.  Skin:    General: Skin is warm and dry.     Comments: Delayed cap refill  Neurological:     General: No focal deficit present.     Mental Status: She is alert and oriented to person, place, and time.     Cranial Nerves: No cranial nerve deficit.     Motor: No weakness.     ED Results / Procedures / Treatments   Labs (all labs ordered are listed, but only abnormal results are displayed) Labs Reviewed  COMPREHENSIVE METABOLIC PANEL - Abnormal; Notable for the following components:      Result Value   Sodium 132 (*)    Chloride 95 (*)    CO2 20 (*)    Glucose, Bld 175 (*)    BUN 7 (*)    Albumin 2.9 (*)    Anion gap 17 (*)    All other components within normal limits  CBC - Abnormal; Notable for the following components:   WBC 18.5 (*)    Platelets 513 (*)    All other components within normal limits  LACTIC ACID, PLASMA - Abnormal; Notable for the following components:   Lactic Acid, Venous 2.4 (*)    All other components within normal limits  PROTIME-INR - Abnormal; Notable for the following components:   Prothrombin Time 32.2 (*)    INR 3.3 (*)    All other components within normal limits  POC OCCULT BLOOD, ED - Abnormal; Notable for the following components:   Fecal Occult Bld POSITIVE (*)    All other components within normal limits  SARS CORONAVIRUS 2 BY RT PCR (HOSPITAL ORDER, [facility])  LACTIC ACID, PLASMA  MAGNESIUM  PHOSPHORUS  TSH  URINALYSIS, ROUTINE W REFLEX MICROSCOPIC  TYPE AND SCREEN  PREPARE RBC (CROSSMATCH)    EKG EKG Interpretation  Date/Time:  Monday February 08 2020 12:04:45 EDT Ventricular Rate:   133 PR Interval:    QRS Duration: 125 QT Interval:  352 QTC Calculation: 524 R Axis:   21 Text Interpretation: Atrial fibrillation Right bundle branch block ST-t wave abnormality Abnormal ECG Confirmed by Carmin Muskrat [phone]) on 02/08/2020 12:14:31 PM   Radiology CT Angio Abd/Pel W and/or Wo Contrast  Result Date: 02/08/2020 CLINICAL DATA:  86 year old female with abdominal pain and possible GI bleeding. EXAM: CTA ABDOMEN AND PELVIS WITHOUT AND WITH CONTRAST TECHNIQUE: Multidetector CT imaging of the abdomen and pelvis was performed using the standard protocol during bolus administration of intravenous contrast. Multiplanar reconstructed images and MIPs were obtained and reviewed to evaluate the vascular anatomy. CONTRAST:  136mL OMNIPAQUE IOHEXOL 350 MG/ML SOLN COMPARISON:  10/25/2011 FINDINGS: VASCULAR Aorta: Mild atherosclerotic changes of the thoracic aorta and mild to moderate atherosclerosis of the abdominal aorta. No dissection. No periaortic fluid. Celiac: Celiac artery patent at the origin without high-grade stenosis. Mild atherosclerosis. Branches are patent and of relatively normal caliber. 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 SMA: The SMA is patent at the origin with minimal atherosclerosis. SMA tapers to very small caliber within the mesentery. There is no occlusion identified on the CT angiogram. Renals: - Right: Right renal artery patent with minimal atherosclerosis. Coronal images demonstrates some  beading pattern of the right renal artery. - Left: Left renal artery patent with minimal atherosclerosis. Coronal images demonstrate questionable subtle beading pattern of the left renal artery. IMA: IMA is patent Right lower extremity: Unremarkable course, caliber, and contour of the right iliac system. Mild atherosclerosis. No aneurysm, dissection, or occlusion. Hypogastric artery is patent. Common femoral artery  patent. Proximal SFA and profunda femoris patent. Left lower extremity: Unremarkable course, caliber, and contour of the left iliac system. Mild atherosclerosis. No aneurysm, dissection, or occlusion. Hypogastric artery is patent. Common femoral artery patent. Proximal SFA and profunda femoris patent. Veins: Portal system: Portal venous system patent. No esophageal varices. No evidence of gastric varices. Splenic vein patent of small caliber. The SMV is patent at the confluence with the portal vein/splenic vein. Distal to the confluence, SMV and the SMV branches are completely decompressed and essentially non visible. Review of the MIP images confirms the above findings. NON-VASCULAR Lower chest: Emphysematous changes at the lung bases. Hepatobiliary: Unremarkable appearance of the liver. Unremarkable gall bladder. Pancreas: Unremarkable. Spleen: I few punctate calcifications within the splenic parenchyma possibly secondary to prior granulomatous disease. Adrenals/Urinary Tract: - Right adrenal gland: Unremarkable - Left adrenal gland: Unremarkable. - Right kidney: No hydronephrosis, nephrolithiasis, inflammation, or ureteral dilation. Small hypoechoic/nonenhancing lesions which are too small to characterize. - Left Kidney: No hydronephrosis, nephrolithiasis, inflammation, or ureteral dilation. Small hypoenhancing hypodense cystic lesions which are too small to characterize. - Urinary Bladder: Unremarkable. Stomach/Bowel: - Stomach: Hiatal hernia. Stomach is decompressed. No fluid fluid level. No accumulation of contrast to suggest extravasation. No focal wall thickening. Mucosa of the stomach enhances within normal limits. - Small bowel: Proximal small bowel decompressed with typical enhancement pattern expected within the small bowel on the venous phase. There is a long segment of small bowel within the mid and lower abdomen demonstrating borderline dilation, extensive edema within the mesentery, and non  enhancement on the arterial and venous phases. Single loop within the mid abdomen anterior to the sacral base on image 131 of series 17 demonstrate is differential enhancement, with enhancement proximally and non enhancement within the adjacent segment. Presumed transition point within the bowel on image 125 of series 17, right abdomen. There is no evidence of accumulation of high density fluid within small bowel that would suggest active extravasation. - Appendix: Appendix not visualized. - Colon: Terminal ileum at the IC valve is decompressed. Proximal colon appears to enhance within normal limits without focal inflammatory changes. Some retained high density material within the transverse colon. Diverticular disease without focal inflammatory changes or distention of the left colon and sigmoid colon. Lymphatic: No lymphadenopathy. Mesenteric: Trace fluid measuring 20 Hounsfield units adjacent to the spleen and the liver. Trace fluid within the pericolic gutter bilaterally. Extensive edema within the small bowel mesentery associated with a nonenhancing bowel loops. Trace free fluid within the anatomic pelvis. No free air. No portal venous gas. Reproductive: Changes of hysterectomy Other: Small fat containing umbilical hernia. Musculoskeletal: Surgical changes of right hip arthroplasty with significant streak artifact limiting evaluation of the pelvis. Degenerative changes of the left hip. Degenerative changes of the visualized thoracolumbar spine. No acute displaced fracture. Osteopenia. IMPRESSION: Small bowel ischemia within the mid and lower abdomen, demonstrating edematous, nonenhancing pattern. This is favored to be secondary to mesenteric adhesions/torsion, and not from thromboembolic arterial disease. Surgical consultation is indicated. CT is negative for evidence of acute gastrointestinal hemorrhage. Small volume reactive ascites. These above preliminary results were discussed by telephone at the time of  interpretation on 02/08/2020 at 2:17 pm with Dr. Herbie Baltimore LOCK[place]OD Minimal aortic atherosclerosis. Aortic Atherosclerosis (ICD10-I70.0). No significant mesenteric arterial disease. Subtle beading changes of the bilateral renal arteries, right greater than left, which may indicate fibromuscular dysplasia. 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 Additional ancillary findings as above. Signed, Dulcy Fanny. Dellia Nims, RPVI [facility] Radiology Electronically Signed   By: Corrie Mckusick D.O.   On: 02/08/2020 14:48    Procedures Procedures (including critical care time)  Medications Ordered in ED Medications  octreotide (SANDOSTATIN) 500 mcg in sodium chloride 0.9 % 250 mL (2 mcg/mL) infusion (has no administration in time range)  phytonadione (VITAMIN K) 5 mg in dextrose 5 % 50 mL IVPB (5 mg Intravenous New Bag/Given 02/08/20 1452)  cefTRIAXone (ROCEPHIN) 2 g in sodium chloride 0.9 % 100 mL IVPB (has no administration in time range)  0.9 %  sodium chloride infusion (has no administration in time range)  acetaminophen (TYLENOL) tablet 650 mg (has no administration in time range)    Or  acetaminophen (TYLENOL) suppository 650 mg (has no administration in time range)  oxyCODONE (Oxy IR/ROXICODONE) immediate release tablet 5 mg (has no administration in time range)  ondansetron (ZOFRAN) tablet 4 mg (has no administration in time range)    Or  ondansetron (ZOFRAN) injection 4 mg (has no administration in time range)  lactated ringers bolus 1,000 mL (0 mLs Intravenous Stopped 02/08/20 1330)  ondansetron (ZOFRAN) injection 4 mg (4 mg Intravenous Given 02/08/20 1231)  morphine 4 MG/ML injection 4 mg (4 mg Intravenous Given 02/08/20 1303)  0.9 %  sodium chloride infusion (10 mL/hr Intravenous New Bag/Given 02/08/20 1300)  iohexol (OMNIPAQUE) 350 MG/ML injection 100 mL (100 mLs Intravenous Contrast Given 02/08/20 1315)    ED Course  I have reviewed  the triage vital signs and the nursing notes.  Pertinent labs & imaging results that were available during my care of the patient were reviewed by me and considered in my medical decision making (see chart for details).    MDM Rules/Calculators/A&P                          86 yo F p/w coffee ground emesis and abdominal pain.  HR 120s-130s and irregular.  BP initially 100s/70s however had numerous episodes of hypotension w/ lowest BP 70s/40s in the s/o ac.  Exam as above and most notable for generalized abdominal pain w/ voluntary guarding and dried blood on pt's tongue that was hemoccult positive.  Pt was given fluids and 2/2 c/f acute GIB 2U of emergency release blood were ordered.  Lactate 2.4.  INR 3.3.  WC 18.5.  Remainder of CMP, CBC unremarkable.  Given c/f GIB GI was consulted who agreed to evaluate the pt at bedside.    CTA was obtained which showed small bowel ischemia likely 2/2 mesenteric adhesions.  Given these findings Surgery was consulted who recommended admission to medicine and agreed to eval the pt once inpatient.  Spoke w/ medicine regarding this pt's course and they agreed w/ admission to their service.  On reevaluation pt reported improved abdominal pain after fentanyl as well as improved nausea and vomiting w/ zofran.  Pt appeared comfortable at that time and BP and HR were stable.  Pt was to be started on Zosyn however she has a PCN allergy and after consultation w/ Pharm pt will be started on Rocephin instead.  Consideration  was made to reversing Coumadin however will hold off at this time given normal Hgb, stable VS, no evidence of acute GIB and only slightly elevated INR.  Will defer this decision to IP team.  Pt is currently admitted to the hospitalist service in stable condition and had no further episodes of hypotension during my shift.    Final Clinical Impression(s) / ED Diagnoses Final diagnoses:  Mesenteric ischemia [facility])    Rx / DC Orders ED Discharge Orders     None       Silvestre Gunner, MD 02/08/20 [place]    Carmin Muskrat, MD 02/09/20 1304

## 2020-02-09 ENCOUNTER — Encounter (HOSPITAL_COMMUNITY): Payer: Self-pay | Admitting: Surgery

## 2020-02-09 DIAGNOSIS — K559 Vascular disorder of intestine, unspecified: Secondary | ICD-10-CM | POA: Diagnosis not present

## 2020-02-09 LAB — TYPE AND SCREEN
ABO/RH(D): O POS
Antibody Screen: NEGATIVE
Unit division: 0
Unit division: 0

## 2020-02-09 LAB — CBC
HCT: 34.6 % — ABNORMAL LOW (ref 36.0–46.0)
Hemoglobin: 10.9 g/dL — ABNORMAL LOW (ref 12.0–15.0)
MCH: 28.7 pg (ref 26.0–34.0)
MCHC: 31.5 g/dL (ref 30.0–36.0)
MCV: 91.1 fL (ref 80.0–100.0)
Platelets: 322 10*3/uL (ref 150–400)
RBC: 3.8 MIL/uL — ABNORMAL LOW (ref 3.87–5.11)
RDW: 15.6 % — ABNORMAL HIGH (ref 11.5–15.5)
WBC: 16.5 10*3/uL — ABNORMAL HIGH (ref 4.0–10.5)
nRBC: 0 % (ref 0.0–0.2)

## 2020-02-09 LAB — MAGNESIUM: Magnesium: 1.6 mg/dL — ABNORMAL LOW (ref 1.7–2.4)

## 2020-02-09 LAB — COMPREHENSIVE METABOLIC PANEL
ALT: 9 U/L (ref 0–44)
AST: 15 U/L (ref 15–41)
Albumin: 2.5 g/dL — ABNORMAL LOW (ref 3.5–5.0)
Alkaline Phosphatase: 59 U/L (ref 38–126)
Anion gap: 6 (ref 5–15)
BUN: 9 mg/dL (ref 8–23)
CO2: 28 mmol/L (ref 22–32)
Calcium: 8.3 mg/dL — ABNORMAL LOW (ref 8.9–10.3)
Chloride: 100 mmol/L (ref 98–111)
Creatinine, Ser: 0.67 mg/dL (ref 0.44–1.00)
GFR calc Af Amer: >60 mL/min (ref >60)
GFR calc non Af Amer: >60 mL/min (ref >60)
Glucose, Bld: 131 mg/dL — ABNORMAL HIGH (ref 70–99)
Potassium: 3.9 mmol/L (ref 3.5–5.1)
Sodium: 134 mmol/L — ABNORMAL LOW (ref 135–145)
Total Bilirubin: 0.5 mg/dL (ref 0.3–1.2)
Total Protein: 4.8 g/dL — ABNORMAL LOW (ref 6.5–8.1)

## 2020-02-09 LAB — PROCALCITONIN: Procalcitonin: 0.2 ng/mL

## 2020-02-09 LAB — TSH: TSH: 2.376 u[IU]/mL (ref 0.350–4.500)

## 2020-02-09 LAB — HEMOGLOBIN A1C
Hgb A1c MFr Bld: 5.3 % (ref 4.8–5.6)
Mean Plasma Glucose: 105.41 mg/dL

## 2020-02-09 LAB — LACTIC ACID, PLASMA: Lactic Acid, Venous: 1 mmol/L (ref 0.5–1.9)

## 2020-02-09 LAB — BPAM RBC
Blood Product Expiration Date: 202109032359
Blood Product Expiration Date: 202109072359
ISSUE DATE / TIME: 202108091248
ISSUE DATE / TIME: 202108091248
Unit Type and Rh: 5100
Unit Type and Rh: 5100

## 2020-02-09 LAB — PROTIME-INR
INR: 1.4 — ABNORMAL HIGH (ref 0.8–1.2)
Prothrombin Time: 16.5 seconds — ABNORMAL HIGH (ref 11.4–15.2)

## 2020-02-09 LAB — BLOOD PRODUCT ORDER (VERBAL) VERIFICATION

## 2020-02-09 LAB — PHOSPHORUS: Phosphorus: 2.6 mg/dL (ref 2.5–4.6)

## 2020-02-09 MED ORDER — ENOXAPARIN SODIUM 40 MG/0.4ML ~~LOC~~ SOLN
40.0000 mg | SUBCUTANEOUS | Status: DC
  Administered 2020-02-09 – 2020-02-16 (×8): 40 mg via SUBCUTANEOUS
  Filled 2020-02-09 (×8): qty 0.4

## 2020-02-09 MED ORDER — METHOCARBAMOL 1000 MG/10ML IJ SOLN
500.0000 mg | Freq: Three times a day (TID) | INTRAVENOUS | Status: DC | PRN
  Administered 2020-02-10: 500 mg via INTRAVENOUS
  Filled 2020-02-09 (×3): qty 5

## 2020-02-09 MED ORDER — MAGNESIUM SULFATE 4 GM/100ML IV SOLN
4.0000 g | Freq: Once | INTRAVENOUS | Status: DC
  Filled 2020-02-09: qty 100

## 2020-02-09 MED ORDER — LEVOTHYROXINE SODIUM 100 MCG/5ML IV SOLN
25.0000 ug | Freq: Every day | INTRAVENOUS | Status: DC
  Administered 2020-02-12 – 2020-02-15 (×4): 25 ug via INTRAVENOUS
  Filled 2020-02-09 (×4): qty 5

## 2020-02-09 MED ORDER — CHLORHEXIDINE GLUCONATE CLOTH 2 % EX PADS
6.0000 | MEDICATED_PAD | Freq: Every day | CUTANEOUS | Status: DC
  Administered 2020-02-09 – 2020-02-17 (×8): 6 via TOPICAL

## 2020-02-09 MED ORDER — KCL IN DEXTROSE-NACL 20-5-0.45 MEQ/L-%-% IV SOLN
INTRAVENOUS | Status: DC
  Filled 2020-02-09 (×13): qty 1000

## 2020-02-09 NOTE — Addendum Note (Signed)
Addendum  created 02/09/20 0808 by Josephine Igo, CRNA   Order list changed

## 2020-02-09 NOTE — Progress Notes (Signed)
Inpatient Rehabilitation Admissions Coordinator  I met with patient with her son in law at bedside.e Patient previously at [facility] 10/2019, returned to acute for additional hip surgery and then discharged to [facility] with eventual d/c home. I discussed the option of admit to CIR vs SNF. Son in law to further discuss with his wife and patient and I will follow up with their decision.  Danne Baxter, RN, MSN Rehab Admissions Coordinator [phone] 02/09/2020 2:51 PM

## 2020-02-09 NOTE — Progress Notes (Signed)
PROGRESS NOTE  Stephanie Cortez KXF:[id number] DOB: 1934-02-10 DOA: 02/08/2020 PCP: [name], MD  HPI/Recap of past 24 hours: HPI: Stephanie Cortez is a 86 y.o. female with medical history significant of permanent A. fib on Coumadin, colitis associated with diverticulitis, IBS, cricopharyngeal achalasia, hiatal hernia, chronic back pain, hypertension, hypothyroidism, GERD, depression/anxiety, right hip fracture status post recent total hip replacement on 11/16/2019 presents to emergency department with abdominal pain 4 days prior to the edition.  Associated with coffee-ground emesis.  She takes tramadol for her right hip pain.  Has been constipated lately.  Was prescribed enema by her PCP which was given the evening prior to presentation.  ED Course: Upon arrival to ED: Patient tachycardic, tachypneic, initial blood pressure soft, leukocytosis of 18.5K, lactic acid: 2.4, POC occult blood positive, INR: 3.3, COVID-19 negative, CT angio of abdomen/pelvis shows small bowel ischemia likely secondary to bowel adhesions/torsions.  Received IV fluid bolus, morphine, Zofran, Rocephin in the ED.  EDP consulted general surgery and GI.  [facility] hospital[facility] consulted for admission.  Post exploratory laparotomy, lysis of adhsions, small bowel resection, gastrostomy tube placement on 02/08/20 by Dr. Molli Posey.  02/09/20: Seen and examined at her bedside.  Reports mild abdominal pain 3-4 out of 10.  No nausea.  No cardiopulmonary complaints, states she is on 2 L oxygen nasal cannula at baseline as needed.  Denies flatus this morning.   Assessment/Plan: Principal Problem:   Small bowel ischemia (HCC) Active Problems:   HYPERTENSION, BENIGN   A-fib (HCC)   Hypothyroid   GERD (gastroesophageal reflux disease)   Hyponatremia   Depression   Sepsis (HCC)   Thrombocytosis (HCC)   Abnormal CT of the abdomen  Sepsis secondary to small bowel ischemia, peritonitis:  Presented tachycardic, tachypneic, with  leukocytosis 18.5K with evidence of peritonitis, small bowel ischemia on CT scan Taken to the OR on 02/08/2020. Post exploratory laparotomy, lysis of adhsions, small bowel resection, gastrostomy tube placement on 02/08/20 by Dr. Molli Posey. Currently on meropenem, continue Leukocytosis is trending down Repeat CBC with differential in the morning  Post exploratory laparotomy, lysis of adhesions, small bowel resection, gastrostomy tube placement on 02/08/20 by Dr. Molli Posey. Denies flatus this morning Continue IV fluid hydration Replete electrolytes as needed Goal potassium greater than 4.0, goal magnesium greater than 2.0 Pain control Early mobilization as tolerated  Recent right total hip replacement PT assessment Pain control Plan to DC to CIR Fall precautions  Chronic hypoxia On 2 L nasal cannula as needed at home O2 saturation greater than 97% on 2 L. Appears to be at her baseline Start incentive spirometer and flutter valve.  Essential hypertension:  Blood pressures are currently soft  Continue to monitor vital signs  -Hydralazine as needed for blood pressure more than 160/100  Hypothyroidism:  TSH 2.3 Levothyroxine IV, switch to oral when able to tolerate p.o.  Hyperlipidemia:  Hold statin due to n.p.o.  Chronic constipation:  Suspect opiate-induced  Start bowel regimen when no longer n.p.o.  Thrombocytosis:  Platelet: 513, -Likely reactive. Repeat labs in the morning  Persistent A. fib on Coumadin: Presented with supratherapeutic INR, received Kcentra. Will ask surgery when okay to resume anticoagulation  Depression/anxiety:  Stable Hold home meds due to n.p.o.  Resolving hypovolemic hyponatremia:  Sodium of 132> 134 -Continue IV fluids.   DVT prophylaxis: SCD, Code Status: Full code Family Communication:  Updated the patient's son-in-law in person on 02/09/2020.   Consults called:  General surgery    Status is: Inpatient  Dispo:  Patient From:  Home  Planned Disposition: CIR  Expected discharge date: 02/12/20  Medically stable for discharge: No, post surgical care.         Objective: Vitals:   02/09/20 0347 02/09/20 0429 02/09/20 0741 02/09/20 1330  BP: (!) 140/49 (!) 127/54 128/67 (!) 133/57  Pulse: 89 90 95 88  Resp: (!) 27 19 (!) 21 (!) 21  Temp: 98.3 F (36.8 C) 98.3 F (36.8 C) 98.3 F (36.8 C) 97.8 F (36.6 C)  TempSrc: Oral Oral Oral Oral  SpO2: 94% 94% 93% 97%  Weight:      Height:        Intake/Output Summary (Last 24 hours) at 02/09/2020 1518 Last data filed at 02/09/2020 0630 Gross per 24 hour  Intake 1710 ml  Output 530 ml  Net 1180 ml   Filed Weights   02/08/20 1143  Weight: 74.8 kg    Exam:  . General: 86 y.o. year-old female well developed well nourished in no acute distress.  Alert and interactive. . Cardiovascular: Regular rate and rhythm with no rubs or gallops.  No thyromegaly or JVD noted.   [name] Kitchen Respiratory: Mild rales at bases no wheezing noted.   . Abdomen: Soft hypoactive bowel sounds.  Gastrostomy tube present. . Musculoskeletal: No lower extremity edema. 2/4 pulses in all 4 extremities. [name] Kitchen Psychiatry: Mood is appropriate for condition and setting   Data Reviewed: CBC: Recent Labs  Lab 02/08/20 1150 02/08/20 1835 02/08/20 1844 02/09/20 0351  WBC 18.5*  --   --  16.5*  HGB 13.9 14.3 13.6 10.9*  HCT 44.8 42.0 40.0 34.6*  MCV 92.0  --   --  91.1  PLT 513*  --   --  947   Basic Metabolic Panel: Recent Labs  Lab 02/08/20 1150 02/08/20 1835 02/08/20 1844 02/09/20 0351  NA 132* 132* 131* 134*  K 3.8 4.0 4.0 3.9  CL 95* 97*  --  100  CO2 20*  --   --  28  GLUCOSE 175* 148*  --  131*  BUN 7* 11  --  9  CREATININE 0.86 0.60  --  0.67  CALCIUM 9.0  --   --  8.3*  MG  --   --   --  1.6*  PHOS  --   --   --  2.6   GFR: Estimated Creatinine Clearance: 54.1 mL/min (by C-G formula based on SCr of 0.67 mg/dL). Liver Function Tests: Recent Labs  Lab 02/08/20 1150  02/09/20 0351  AST 20 15  ALT 11 9  ALKPHOS 114 59  BILITOT 0.8 0.5  PROT 6.8 4.8*  ALBUMIN 2.9* 2.5*   No results for input(s): LIPASE, AMYLASE in the last 168 hours. No results for input(s): AMMONIA in the last 168 hours. Coagulation Profile: Recent Labs  Lab 02/08/20 1246 02/09/20 0351  INR 3.3* 1.4*   Cardiac Enzymes: No results for input(s): CKTOTAL, CKMB, CKMBINDEX, TROPONINI in the last 168 hours. BNP (last 3 results) No results for input(s): PROBNP in the last 8760 hours. HbA1C: Recent Labs    02/09/20 0640  HGBA1C 5.3   CBG: No results for input(s): GLUCAP in the last 168 hours. Lipid Profile: No results for input(s): CHOL, HDL, LDLCALC, TRIG, CHOLHDL, LDLDIRECT in the last 72 hours. Thyroid Function Tests: Recent Labs    02/09/20 0417  TSH 2.376   Anemia Panel: No results for input(s): VITAMINB12, FOLATE, FERRITIN, TIBC, IRON, RETICCTPCT in the last 72 hours. Urine analysis:  Component Value Date/Time   COLORURINE STRAW (A) 11/14/2019 0210   APPEARANCEUR CLEAR 11/14/2019 0210   LABSPEC 1.006 11/14/2019 0210   PHURINE 7.0 11/14/2019 0210   GLUCOSEU NEGATIVE 11/14/2019 0210   HGBUR SMALL (A) 11/14/2019 0210   BILIRUBINUR NEGATIVE 11/14/2019 0210   BILIRUBINUR Neg 02/06/2012 1022   KETONESUR NEGATIVE 11/14/2019 0210   PROTEINUR NEGATIVE 11/14/2019 0210   UROBILINOGEN 0.2 04/07/2012 1443   NITRITE NEGATIVE 11/14/2019 0210   LEUKOCYTESUR NEGATIVE 11/14/2019 0210   Sepsis Labs: @LABRCNTIP (procalcitonin:4,lacticidven:4)  ) Recent Results (from the past 240 hour(s))  SARS Coronavirus 2 by RT PCR (hospital order, performed in [facility] hospital lab) Nasopharyngeal Nasopharyngeal Swab     Status: None   Collection Time: 02/08/20  1:26 PM   Specimen: Nasopharyngeal Swab  Result Value Ref Range Status   SARS Coronavirus 2 NEGATIVE NEGATIVE Final    Comment: (NOTE) SARS-CoV-2 target nucleic acids are NOT DETECTED.  The SARS-CoV-2 RNA is generally  detectable in upper and lower respiratory specimens during the acute phase of infection. The lowest concentration of SARS-CoV-2 viral copies this assay can detect is 250 copies / mL. A negative result does not preclude SARS-CoV-2 infection and should not be used as the sole basis for treatment or other patient management decisions.  A negative result may occur with improper specimen collection / handling, submission of specimen other than nasopharyngeal swab, presence of viral mutation(s) within the areas targeted by this assay, and inadequate number of viral copies (<250 copies / mL). A negative result must be combined with clinical observations, patient history, and epidemiological information.  Fact Sheet for Patients:   StrictlyIdeas.no  Fact Sheet for Healthcare Providers: BankingDealers.co.za  This test is not yet approved or  cleared by the [place] FDA and has been authorized for detection and/or diagnosis of SARS-CoV-2 by FDA under an Emergency Use Authorization (EUA).  This EUA will remain in effect (meaning this test can be used) for the duration of the COVID-19 declaration under Section 564(b)(1) of the Act, 21 U.S.C. section 360bbb-3(b)(1), unless the authorization is terminated or revoked sooner.  Performed at [facility], [place] [address], [place], [place] [address]       Studies: DG CHEST PORT 1 VIEW  Result Date: 02/08/2020 CLINICAL DATA:  86 year old female status post left-sided PICC placement. EXAM: PORTABLE CHEST 1 VIEW COMPARISON:  Chest radiograph dated 11/15/2019. FINDINGS: Left-sided PICC with tip in the left innominate vein approximately 2.2 cm proximal to the SVC. No pneumothorax. Bilateral upper lobe interstitial and reticular densities progressed since the prior radiograph. No pleural effusion. Stable cardiomegaly. Atherosclerotic calcification of the aorta. Degenerative changes of the spine. Old  left humeral neck fracture. IMPRESSION: 1. Left-sided PICC with tip in the left innominate vein. No pneumothorax. 2. Bilateral upper lobe interstitial and reticular densities progressed since the prior radiograph. Electronically Signed   By: Anner Crete M.D.   On: 02/08/2020 20:33    Scheduled Meds: . Chlorhexidine Gluconate Cloth  6 each Topical Daily  . [START ON 02/12/2020] levothyroxine  25 mcg Intravenous Daily  . mometasone-formoterol  2 puff Inhalation BID    Continuous Infusions: . sodium chloride 10 mL/hr at 02/09/20 0630  . dextrose 5 % and 0.45 % NaCl with KCl 20 mEq/L 100 mL/hr at 02/09/20 1119  . diltiazem (CARDIZEM) infusion 10 mg/hr (02/09/20 0404)  . famotidine (PEPCID) IV    . lactated ringers    . magnesium sulfate bolus IVPB    . meropenem (  MERREM) IV 1 g (02/09/20 1115)  . methocarbamol (ROBAXIN) IV       LOS: 1 day     Kayleen Memos, MD [facility] Pager [phone]  If 7PM-7AM, please contact night-coverage [email or web address] Password [facility] 02/09/2020, 3:18 PM

## 2020-02-09 NOTE — Progress Notes (Signed)
PCCM progress note   Patient appears stable post op. No further critical care needs identified.   PCCM will sign off. Thank you for the opportunity to participate in this patient's care. Please contact if we can be of further assistance.  Johnsie Cancel, NP-C  Pulmonary & Critical Care Contact / Pager information can be found on Amion  02/09/2020, 10:31 AM

## 2020-02-09 NOTE — Consult Note (Signed)
   [facility] CM Inpatient Consult   02/09/2020  ELISHEBA MCDONNELL 1933/12/15 [id number]  [facility] [ACO] Patient: Medicare ACO status is pending community care coordination   Patient screened for high risk score for unplanned readmission score and for hospitalizations to check if potential [place] Management service needs.  Review of patient's medical record reveals patient is being recommended for ongoing need for therapy.  Chart reviewed for potential [facility] needs.   Primary Care Provider is this provider is [name], MD listed to provide the transition of care [TOC] for post hospital follow up.    Patient is being recommended for CIR, hx at a SNF.  Patient had been referred to [facility] post facility. Plan: Continue to follow progress and disposition to assess for post hospital care management needs.  Update patient THN status to not active as she declined services at transition.  Please place a [facility] Care Management consult as appropriate and for questions contact:   Natividad Brood, RN BSN [facility] Liaison  [phone] business mobile phone Toll free office [phone]  Fax number: [phone] [email or web address]

## 2020-02-09 NOTE — Progress Notes (Signed)
Rehab Admissions Coordinator Note:  Patient was screened by Cleatrice Burke for appropriateness for an Inpatient Acute Rehab Consult per therapy recs..  At this time, we are recommending Inpatient Rehab consult. I will place order per protocol.  Cheray, Pardi RN MSN 02/09/2020, 1:36 PM  I can be reached at [phone].

## 2020-02-09 NOTE — Evaluation (Signed)
Physical Therapy Evaluation Patient Details Name: Stephanie Cortez MRN: [id number] DOB: Dec 26, 1933 Today's Date: 02/09/2020   History of Present Illness  86yo female c/o worsening abdominal pain. Tachycardic and tachypneic, also hypotensive in the ED. CTA shows small bowel ischemia in mid/lower abdomen. Received exploratory laparotomy, small bowel resection, and gastrostomy tube placement 8/9. PMH vertigo, BLE neuropathy, IBS, HTN, COPD, CHF, A-fib, anxiety, R THA and subsequent revision, hx foot surgery  Clinical Impression   Patient received in bed, very cooperative and pleasant and motivated to work with therapy today. Requires quite a bit of assistance to perform functional mobility today, limited to bed mobility and squat pivot transfers to/from [facility] and recliner due to poor balance, impaired functional activity tolerance, and gross weakness. Strong posterior lean and very high fall risk requiring assist of two persons to maintain balance during mobility. Left up in recliner with all needs met, chair alarm active and nursing staff aware of patient status. Feel she would benefit from CIR due to complex medical history, current debility and high motivation to work with/success in the past with therapies.     Follow Up Recommendations CIR    Equipment Recommendations  Rolling walker with 5" wheels;3in1 (PT)    Recommendations for Other Services       Precautions / Restrictions Precautions Precautions: Fall;Other (comment) Precaution Comments: gastric drainage bag/abdominal incisions, recent hx of R posterior THA WBAT, posterior lean Restrictions Weight Bearing Restrictions: No      Mobility  Bed Mobility Overal bed mobility: Needs Assistance Bed Mobility: Supine to Sit     Supine to sit: Mod assist;+2 for physical assistance;HOB elevated     General bed mobility comments: ModA+2 to get to EOB, strong posterior lean even in sitting initially but this faded to min guard the longer  she sat at EOB  Transfers Overall transfer level: Needs assistance Equipment used: Rolling walker (2 wheeled) Transfers: Sit to/from [facility] Sit to Stand: Mod assist;+2 physical assistance Stand pivot transfers: Min assist       General transfer comment: modA to boost to full standing position, strong posterior lean requiring two persons to gain balance; once upright and balanced, able to take pivotal steps to [facility] and recliner with MinA  Ambulation/Gait             General Gait Details: deferred- fatigue  Stairs            Wheelchair Mobility    Modified Rankin (Stroke Patients Only)       Balance Overall balance assessment: Needs assistance Sitting-balance support: Feet supported;Bilateral upper extremity supported Sitting balance-Leahy Scale: Poor Sitting balance - Comments: initial strong posterior lean at EOB faded to min guard Postural control: Posterior lean Standing balance support: Bilateral upper extremity supported;During functional activity Standing balance-Leahy Scale: Poor Standing balance comment: needs MinA and RW to maintain balance                             Pertinent Vitals/Pain Pain Assessment: Faces Faces Pain Scale: Hurts little more Pain Location: generalized Pain Descriptors / Indicators: Aching;Sore Pain Intervention(s): Limited activity within patient's tolerance;Monitored during session;Repositioned    Home Living Family/patient expects to be discharged to:: Private residence Living Arrangements: Alone Available Help at Discharge: Family;Available 24 hours/day Type of Home: House Home Access: Stairs to enter Entrance Stairs-Rails: Can reach both Entrance Stairs-Number of Steps: 2 front, 3 in back Home Layout: One level Home Equipment: Grab bars -  tub/shower;Shower seat - built in;Walker - 2 wheels;Wheelchair - manual Additional Comments: has not been using walkin shower bc she can't get to it,  has been using tub bench instead in regualr bathroom    Prior Function Level of Independence: Independent with assistive device(s)         Comments: has had a lot of hip complications recently as well as lots of rehab at CIR and [facility]- but was starting ot stay alone at home at night     Hand Dominance        Extremity/Trunk Assessment   Upper Extremity Assessment Upper Extremity Assessment: Defer to OT evaluation    Lower Extremity Assessment Lower Extremity Assessment: Generalized weakness    Cervical / Trunk Assessment Cervical / Trunk Assessment: Kyphotic  Communication   Communication: No difficulties  Cognition Arousal/Alertness: Awake/alert Behavior During Therapy: Flat affect Overall Cognitive Status: Within Functional Limits for tasks assessed                                 General Comments: very pleasant and cooperative, lots of experience in rehab and reports she wants to work hard to get better      General Comments General comments (skin integrity, edema, etc.): VSS    Exercises     Assessment/Plan    PT Assessment Patient needs continued PT services  PT Problem List Decreased strength;Decreased activity tolerance;Decreased safety awareness;Decreased balance;Decreased mobility;Decreased coordination;Pain       PT Treatment Interventions DME instruction;Balance training;Gait training;Stair training;Cognitive remediation;Functional mobility training;Patient/family education;Therapeutic activities;Therapeutic exercise    PT Goals (Current goals can be found in the Care Plan section)  Acute Rehab PT Goals Patient Stated Goal: go to rehab and get stronger Time For Goal Achievement: 02/23/20 Potential to Achieve Goals: Good    Frequency Min 3X/week   Barriers to discharge        Co-evaluation               AM-PAC PT "6 Clicks" Mobility  Outcome Measure Help needed turning from your back to your side while in a flat bed  without using bedrails?: A Lot Help needed moving from lying on your back to sitting on the side of a flat bed without using bedrails?: A Lot Help needed moving to and from a bed to a chair (including a wheelchair)?: A Lot Help needed standing up from a chair using your arms (e.g., wheelchair or bedside chair)?: A Lot Help needed to walk in hospital room?: A Lot Help needed climbing 3-5 steps with a railing? : Total 6 Click Score: 11    End of Session Equipment Utilized During Treatment: Gait belt;Oxygen Activity Tolerance: Patient tolerated treatment well;Patient limited by pain Patient left: in chair;with call bell/phone within reach;with chair alarm set Nurse Communication: Mobility status PT Visit Diagnosis: Unsteadiness on feet (R26.81);Difficulty in walking, not elsewhere classified (R26.2);Muscle weakness (generalized) (M62.81)    Time: 0211-1735 PT Time Calculation (min) (ACUTE ONLY): 56 min   Charges:   PT Evaluation $PT Eval Moderate Complexity: 1 Mod PT Treatments $Therapeutic Activity: 38-52 mins        Windell Norfolk, DPT, PN1   Supplemental Physical Therapist [place]    Pager [phone] Acute Rehab Office [phone]

## 2020-02-09 NOTE — Progress Notes (Signed)
1 Day Post-Op   Subjective/Chief Complaint: Doing well this am, no n/v, no flatus/bm, pain controlled   Objective: Vital signs in last 24 hours: Temp:  [97.3 F (36.3 C)-98.3 F (36.8 C)] 98.3 F (36.8 C) (08/10 0741) Pulse Rate:  [30-124] 95 (08/10 0741) Resp:  [17-36] 21 (08/10 0741) BP: (88-157)/(49-87) 128/67 (08/10 0741) SpO2:  [93 %-100 %] 93 % (08/10 0741) Arterial Line BP: (130-154)/(56-64) 154/56 (08/09 2100) Weight:  [74.8 kg] 74.8 kg (08/09 1143)    Intake/Output from previous day: 08/09 0701 - 08/10 0700 In: 1710 [I.V.:1110; IV Piggyback:600] Out: 530 [Urine:380; Blood:150] Intake/Output this shift: No intake/output data recorded.  GI: soft dressing dry, approp tender no bs g tube to drainage  Lab Results:  Recent Labs    02/08/20 1150 02/08/20 1835 02/08/20 1844 02/09/20 0351  WBC 18.5*  --   --  16.5*  HGB 13.9   < > 13.6 10.9*  HCT 44.8   < > 40.0 34.6*  PLT 513*  --   --  322   < > = values in this interval not displayed.   BMET Recent Labs    02/08/20 1150 02/08/20 1150 02/08/20 1835 02/08/20 1835 02/08/20 1844 02/09/20 0351  NA 132*   < > 132*   < > 131* 134*  K 3.8   < > 4.0   < > 4.0 3.9  CL 95*   < > 97*  --   --  100  CO2 20*  --   --   --   --  28  GLUCOSE 175*   < > 148*  --   --  131*  BUN 7*   < > 11  --   --  9  CREATININE 0.86   < > 0.60  --   --  0.67  CALCIUM 9.0  --   --   --   --  8.3*   < > = values in this interval not displayed.   PT/INR Recent Labs    02/08/20 1246 02/09/20 0351  LABPROT 32.2* 16.5*  INR 3.3* 1.4*   ABG Recent Labs    02/08/20 1844  PHART 7.393  HCO3 23.0    Studies/Results: DG CHEST PORT 1 VIEW  Result Date: 02/08/2020 CLINICAL DATA:  86 year old female status post left-sided PICC placement. EXAM: PORTABLE CHEST 1 VIEW COMPARISON:  Chest radiograph dated 11/15/2019. FINDINGS: Left-sided PICC with tip in the left innominate vein approximately 2.2 cm proximal to the SVC. No pneumothorax.  Bilateral upper lobe interstitial and reticular densities progressed since the prior radiograph. No pleural effusion. Stable cardiomegaly. Atherosclerotic calcification of the aorta. Degenerative changes of the spine. Old left humeral neck fracture. IMPRESSION: 1. Left-sided PICC with tip in the left innominate vein. No pneumothorax. 2. Bilateral upper lobe interstitial and reticular densities progressed since the prior radiograph. Electronically Signed   By: Anner Crete M.D.   On: 02/08/2020 20:33   CT Angio Abd/Pel W and/or Wo Contrast  Result Date: 02/08/2020 CLINICAL DATA:  86 year old female with abdominal pain and possible GI bleeding. EXAM: CTA ABDOMEN AND PELVIS WITHOUT AND WITH CONTRAST TECHNIQUE: Multidetector CT imaging of the abdomen and pelvis was performed using the standard protocol during bolus administration of intravenous contrast. Multiplanar reconstructed images and MIPs were obtained and reviewed to evaluate the vascular anatomy. CONTRAST:  146mL OMNIPAQUE IOHEXOL 350 MG/ML SOLN COMPARISON:  10/25/2011 FINDINGS: VASCULAR Aorta: Mild atherosclerotic changes of the thoracic aorta and mild to moderate atherosclerosis of the  abdominal aorta. No dissection. No periaortic fluid. Celiac: Celiac artery patent at the origin without high-grade stenosis. Mild atherosclerosis. Branches are patent and of relatively normal caliber. 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 SMA: The SMA is patent at the origin with minimal atherosclerosis. SMA tapers to very small caliber within the mesentery. There is no occlusion identified on the CT angiogram. Renals: - Right: Right renal artery patent with minimal atherosclerosis. Coronal images demonstrates some beading pattern of the right renal artery. - Left: Left renal artery patent with minimal atherosclerosis. Coronal images demonstrate questionable subtle beading pattern of the left  renal artery. IMA: IMA is patent Right lower extremity: Unremarkable course, caliber, and contour of the right iliac system. Mild atherosclerosis. No aneurysm, dissection, or occlusion. Hypogastric artery is patent. Common femoral artery patent. Proximal SFA and profunda femoris patent. Left lower extremity: Unremarkable course, caliber, and contour of the left iliac system. Mild atherosclerosis. No aneurysm, dissection, or occlusion. Hypogastric artery is patent. Common femoral artery patent. Proximal SFA and profunda femoris patent. Veins: Portal system: Portal venous system patent. No esophageal varices. No evidence of gastric varices. Splenic vein patent of small caliber. The SMV is patent at the confluence with the portal vein/splenic vein. Distal to the confluence, SMV and the SMV branches are completely decompressed and essentially non visible. Review of the MIP images confirms the above findings. NON-VASCULAR Lower chest: Emphysematous changes at the lung bases. Hepatobiliary: Unremarkable appearance of the liver. Unremarkable gall bladder. Pancreas: Unremarkable. Spleen: I few punctate calcifications within the splenic parenchyma possibly secondary to prior granulomatous disease. Adrenals/Urinary Tract: - Right adrenal gland: Unremarkable - Left adrenal gland: Unremarkable. - Right kidney: No hydronephrosis, nephrolithiasis, inflammation, or ureteral dilation. Small hypoechoic/nonenhancing lesions which are too small to characterize. - Left Kidney: No hydronephrosis, nephrolithiasis, inflammation, or ureteral dilation. Small hypoenhancing hypodense cystic lesions which are too small to characterize. - Urinary Bladder: Unremarkable. Stomach/Bowel: - Stomach: Hiatal hernia. Stomach is decompressed. No fluid fluid level. No accumulation of contrast to suggest extravasation. No focal wall thickening. Mucosa of the stomach enhances within normal limits. - Small bowel: Proximal small bowel decompressed with  typical enhancement pattern expected within the small bowel on the venous phase. There is a long segment of small bowel within the mid and lower abdomen demonstrating borderline dilation, extensive edema within the mesentery, and non enhancement on the arterial and venous phases. Single loop within the mid abdomen anterior to the sacral base on image 131 of series 17 demonstrate is differential enhancement, with enhancement proximally and non enhancement within the adjacent segment. Presumed transition point within the bowel on image 125 of series 17, right abdomen. There is no evidence of accumulation of high density fluid within small bowel that would suggest active extravasation. - Appendix: Appendix not visualized. - Colon: Terminal ileum at the IC valve is decompressed. Proximal colon appears to enhance within normal limits without focal inflammatory changes. Some retained high density material within the transverse colon. Diverticular disease without focal inflammatory changes or distention of the left colon and sigmoid colon. Lymphatic: No lymphadenopathy. Mesenteric: Trace fluid measuring 20 Hounsfield units adjacent to the spleen and the liver. Trace fluid within the pericolic gutter bilaterally. Extensive edema within the small bowel mesentery associated with a nonenhancing bowel loops. Trace free fluid within the anatomic pelvis. No free air. No portal venous gas. Reproductive: Changes of hysterectomy Other: Small fat containing umbilical hernia. Musculoskeletal: Surgical changes of  right hip arthroplasty with significant streak artifact limiting evaluation of the pelvis. Degenerative changes of the left hip. Degenerative changes of the visualized thoracolumbar spine. No acute displaced fracture. Osteopenia. IMPRESSION: Small bowel ischemia within the mid and lower abdomen, demonstrating edematous, nonenhancing pattern. This is favored to be secondary to mesenteric adhesions/torsion, and not from  thromboembolic arterial disease. Surgical consultation is indicated. CT is negative for evidence of acute gastrointestinal hemorrhage. Small volume reactive ascites. These above preliminary results were discussed by telephone at the time of interpretation on 02/08/2020 at 2:17 pm with Dr. Herbie Baltimore LOCKWOOD Minimal aortic atherosclerosis. Aortic Atherosclerosis (ICD10-I70.0). No significant mesenteric arterial disease. Subtle beading changes of the bilateral renal arteries, right greater than left, which may indicate fibromuscular dysplasia. 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 Additional ancillary findings as above. Signed, Dulcy Fanny. Dellia Nims, RPVI [facility] Radiology Electronically Signed   By: Corrie Mckusick D.O.   On: 02/08/2020 14:48    Anti-infectives: Anti-infectives (From admission, onward)   Start     Dose/Rate Route Frequency Ordered Stop   02/08/20 1700  meropenem (MERREM) 1 g in sodium chloride 0.9 % 100 mL IVPB     Discontinue     1 g 200 mL/hr over 30 Minutes Intravenous Every 8 hours 02/08/20 1639     02/08/20 1530  cefTRIAXone (ROCEPHIN) 2 g in sodium chloride 0.9 % 100 mL IVPB  Status:  Discontinued        2 g 200 mL/hr over 30 Minutes Intravenous Every 24 hours 02/08/20 1502 02/08/20 1639   02/08/20 1515  cefTRIAXone (ROCEPHIN) 2 g in sodium chloride 0.9 % 100 mL IVPB  Status:  Discontinued        2 g 200 mL/hr over 30 Minutes Intravenous Every 24 hours 02/08/20 1508 02/08/20 1512   02/08/20 1500  piperacillin-tazobactam (ZOSYN) IVPB 2.25 g  Status:  Discontinued        2.25 g 100 mL/hr over 30 Minutes Intravenous  Once 02/08/20 1453 02/08/20 1502      Assessment/Plan: POD 1 elap/sbr for ischemic bowel-MT -continue g tube to drainage today -ice chips only -oob/pulm toilet -physical therapy -hold anticoagulation today- can have proph lovenox -add robaxin   A fib on coumadin (INR  3.3) Interstitial lung disease on nighttime oxygen HTN HLD Hypothyroidism CHF (EF 60-65% 10/2019)    Rolm Bookbinder 02/09/2020

## 2020-02-10 DIAGNOSIS — I10 Essential (primary) hypertension: Secondary | ICD-10-CM | POA: Diagnosis not present

## 2020-02-10 DIAGNOSIS — E039 Hypothyroidism, unspecified: Secondary | ICD-10-CM

## 2020-02-10 DIAGNOSIS — K559 Vascular disorder of intestine, unspecified: Secondary | ICD-10-CM | POA: Diagnosis not present

## 2020-02-10 DIAGNOSIS — K219 Gastro-esophageal reflux disease without esophagitis: Secondary | ICD-10-CM | POA: Diagnosis not present

## 2020-02-10 DIAGNOSIS — I4821 Permanent atrial fibrillation: Secondary | ICD-10-CM | POA: Diagnosis not present

## 2020-02-10 LAB — CBC WITH DIFFERENTIAL/PLATELET
Abs Immature Granulocytes: 0.05 10*3/uL (ref 0.00–0.07)
Basophils Absolute: 0 10*3/uL (ref 0.0–0.1)
Basophils Relative: 0 %
Eosinophils Absolute: 0 10*3/uL (ref 0.0–0.5)
Eosinophils Relative: 0 %
HCT: 37.2 % (ref 36.0–46.0)
Hemoglobin: 11.6 g/dL — ABNORMAL LOW (ref 12.0–15.0)
Immature Granulocytes: 1 %
Lymphocytes Relative: 11 %
Lymphs Abs: 1.1 10*3/uL (ref 0.7–4.0)
MCH: 28.9 pg (ref 26.0–34.0)
MCHC: 31.2 g/dL (ref 30.0–36.0)
MCV: 92.8 fL (ref 80.0–100.0)
Monocytes Absolute: 0.9 10*3/uL (ref 0.1–1.0)
Monocytes Relative: 9 %
Neutro Abs: 8.1 10*3/uL — ABNORMAL HIGH (ref 1.7–7.7)
Neutrophils Relative %: 79 %
Platelets: 229 10*3/uL (ref 150–400)
RBC: 4.01 MIL/uL (ref 3.87–5.11)
RDW: 15.8 % — ABNORMAL HIGH (ref 11.5–15.5)
WBC: 10.1 10*3/uL (ref 4.0–10.5)
nRBC: 0 % (ref 0.0–0.2)

## 2020-02-10 LAB — COMPREHENSIVE METABOLIC PANEL
ALT: 9 U/L (ref 0–44)
AST: 15 U/L (ref 15–41)
Albumin: 2.2 g/dL — ABNORMAL LOW (ref 3.5–5.0)
Alkaline Phosphatase: 58 U/L (ref 38–126)
Anion gap: 6 (ref 5–15)
BUN: 9 mg/dL (ref 8–23)
CO2: 28 mmol/L (ref 22–32)
Calcium: 8 mg/dL — ABNORMAL LOW (ref 8.9–10.3)
Chloride: 101 mmol/L (ref 98–111)
Creatinine, Ser: 0.53 mg/dL (ref 0.44–1.00)
GFR calc Af Amer: >60 mL/min (ref >60)
GFR calc non Af Amer: >60 mL/min (ref >60)
Glucose, Bld: 134 mg/dL — ABNORMAL HIGH (ref 70–99)
Potassium: 3.7 mmol/L (ref 3.5–5.1)
Sodium: 135 mmol/L (ref 135–145)
Total Bilirubin: 0.4 mg/dL (ref 0.3–1.2)
Total Protein: 4.6 g/dL — ABNORMAL LOW (ref 6.5–8.1)

## 2020-02-10 LAB — PROTIME-INR
INR: 1.3 — ABNORMAL HIGH (ref 0.8–1.2)
Prothrombin Time: 16 seconds — ABNORMAL HIGH (ref 11.4–15.2)

## 2020-02-10 LAB — SURGICAL PATHOLOGY

## 2020-02-10 LAB — PHOSPHORUS: Phosphorus: 1.1 mg/dL — ABNORMAL LOW (ref 2.5–4.6)

## 2020-02-10 LAB — LACTIC ACID, PLASMA: Lactic Acid, Venous: 0.8 mmol/L (ref 0.5–1.9)

## 2020-02-10 LAB — MAGNESIUM: Magnesium: 2.3 mg/dL (ref 1.7–2.4)

## 2020-02-10 MED ORDER — SODIUM PHOSPHATES 45 MMOLE/15ML IV SOLN
30.0000 mmol | Freq: Once | INTRAVENOUS | Status: AC
  Administered 2020-02-10: 30 mmol via INTRAVENOUS
  Filled 2020-02-10: qty 10

## 2020-02-10 NOTE — Progress Notes (Signed)
[facility] Progress Note  2 Days Post-Op  Subjective: CC-  Daughter at bedside. Patient states that she slept well last night but still feels tired. Abdominal pain well controlled. Denies any n/v. Thinks she passed some flatus yesterday, too early to tell today. No BM.  G tube with 900cc output recorded. She is taking in some ice chips.  Objective: Vital signs in last 24 hours: Temp:  [97.8 F (36.6 C)-99.9 F (37.7 C)] 98 F (36.7 C) (08/11 0744) Pulse Rate:  [61-99] 61 (08/11 0744) Resp:  [19-30] 20 (08/11 0744) BP: (130-146)/(47-96) 137/74 (08/11 0744) SpO2:  [95 %-97 %] 97 % (08/11 0744) Last BM Date: 02/09/20  Intake/Output from previous day: 08/10 0701 - 08/11 0700 In: 2719.6 [P.O.:180; I.V.:2113.4; IV Piggyback:426.1] Out: 900 [Drains:900] Intake/Output this shift: No intake/output data recorded.  PE: Gen:  Alert, NAD, pleasant HEENT: EOM's intact, pupils equal and round Card:  Regular rate Pulm:  rate and effort normal Abd: Soft, mild distension, +BS, midline incision cdi with staples intact and no erytheam or drainage, G tube site cdi  Lab Results:  Recent Labs    02/09/20 0351 02/10/20 0453  WBC 16.5* 10.1  HGB 10.9* 11.6*  HCT 34.6* 37.2  PLT 322 229   BMET Recent Labs    02/09/20 0351 02/10/20 0453  NA 134* 135  K 3.9 3.7  CL 100 101  CO2 28 28  GLUCOSE 131* 134*  BUN 9 9  CREATININE 0.67 0.53  CALCIUM 8.3* 8.0*   PT/INR Recent Labs    02/09/20 0351 02/10/20 0453  LABPROT 16.5* 16.0*  INR 1.4* 1.3*   CMP     Component Value Date/Time   NA 135 02/10/2020 0453   NA 130 (L) 02/16/2019 1452   K 3.7 02/10/2020 0453   CL 101 02/10/2020 0453   CO2 28 02/10/2020 0453   GLUCOSE 134 (H) 02/10/2020 0453   BUN 9 02/10/2020 0453   BUN 13 02/16/2019 1452   CREATININE 0.53 02/10/2020 0453   CALCIUM 8.0 (L) 02/10/2020 0453   PROT 4.6 (L) 02/10/2020 0453   ALBUMIN 2.2 (L) 02/10/2020 0453   AST 15 02/10/2020 0453   ALT 9  02/10/2020 0453   ALKPHOS 58 02/10/2020 0453   BILITOT 0.4 02/10/2020 0453   GFRNONAA >60 02/10/2020 0453   GFRAA >60 02/10/2020 0453   Lipase     Component Value Date/Time   LIPASE 29.0 10/08/2011 1046       Studies/Results: DG CHEST PORT 1 VIEW  Result Date: 02/08/2020 CLINICAL DATA:  86 year old female status post left-sided PICC placement. EXAM: PORTABLE CHEST 1 VIEW COMPARISON:  Chest radiograph dated 11/15/2019. FINDINGS: Left-sided PICC with tip in the left innominate vein approximately 2.2 cm proximal to the SVC. No pneumothorax. Bilateral upper lobe interstitial and reticular densities progressed since the prior radiograph. No pleural effusion. Stable cardiomegaly. Atherosclerotic calcification of the aorta. Degenerative changes of the spine. Old left humeral neck fracture. IMPRESSION: 1. Left-sided PICC with tip in the left innominate vein. No pneumothorax. 2. Bilateral upper lobe interstitial and reticular densities progressed since the prior radiograph. Electronically Signed   By: Anner Crete M.D.   On: 02/08/2020 20:33   CT Angio Abd/Pel W and/or Wo Contrast  Result Date: 02/08/2020 CLINICAL DATA:  86 year old female with abdominal pain and possible GI bleeding. EXAM: CTA ABDOMEN AND PELVIS WITHOUT AND WITH CONTRAST TECHNIQUE: Multidetector CT imaging of the abdomen and pelvis was performed using the standard protocol during bolus administration of  intravenous contrast. Multiplanar reconstructed images and MIPs were obtained and reviewed to evaluate the vascular anatomy. CONTRAST:  119mL OMNIPAQUE IOHEXOL 350 MG/ML SOLN COMPARISON:  10/25/2011 FINDINGS: VASCULAR Aorta: Mild atherosclerotic changes of the thoracic aorta and mild to moderate atherosclerosis of the abdominal aorta. No dissection. No periaortic fluid. Celiac: Celiac artery patent at the origin without high-grade stenosis. Mild atherosclerosis. Branches are patent and of relatively normal caliber. Partially calcified  aneurysm of splenic artery, posterior to the stomach and near the splenic hilum measuring 8 mm. SMA: The SMA is patent at the origin with minimal atherosclerosis. SMA tapers to very small caliber within the mesentery. There is no occlusion identified on the CT angiogram. Renals: - Right: Right renal artery patent with minimal atherosclerosis. Coronal images demonstrates some beading pattern of the right renal artery. - Left: Left renal artery patent with minimal atherosclerosis. Coronal images demonstrate questionable subtle beading pattern of the left renal artery. IMA: IMA is patent Right lower extremity: Unremarkable course, caliber, and contour of the right iliac system. Mild atherosclerosis. No aneurysm, dissection, or occlusion. Hypogastric artery is patent. Common femoral artery patent. Proximal SFA and profunda femoris patent. Left lower extremity: Unremarkable course, caliber, and contour of the left iliac system. Mild atherosclerosis. No aneurysm, dissection, or occlusion. Hypogastric artery is patent. Common femoral artery patent. Proximal SFA and profunda femoris patent. Veins: Portal system: Portal venous system patent. No esophageal varices. No evidence of gastric varices. Splenic vein patent of small caliber. The SMV is patent at the confluence with the portal vein/splenic vein. Distal to the confluence, SMV and the SMV branches are completely decompressed and essentially non visible. Review of the MIP images confirms the above findings. NON-VASCULAR Lower chest: Emphysematous changes at the lung bases. Hepatobiliary: Unremarkable appearance of the liver. Unremarkable gall bladder. Pancreas: Unremarkable. Spleen: I few punctate calcifications within the splenic parenchyma possibly secondary to prior granulomatous disease. Adrenals/Urinary Tract: - Right adrenal gland: Unremarkable - Left adrenal gland: Unremarkable. - Right kidney: No hydronephrosis, nephrolithiasis, inflammation, or ureteral  dilation. Small hypoechoic/nonenhancing lesions which are too small to characterize. - Left Kidney: No hydronephrosis, nephrolithiasis, inflammation, or ureteral dilation. Small hypoenhancing hypodense cystic lesions which are too small to characterize. - Urinary Bladder: Unremarkable. Stomach/Bowel: - Stomach: Hiatal hernia. Stomach is decompressed. No fluid fluid level. No accumulation of contrast to suggest extravasation. No focal wall thickening. Mucosa of the stomach enhances within normal limits. - Small bowel: Proximal small bowel decompressed with typical enhancement pattern expected within the small bowel on the venous phase. There is a long segment of small bowel within the mid and lower abdomen demonstrating borderline dilation, extensive edema within the mesentery, and non enhancement on the arterial and venous phases. Single loop within the mid abdomen anterior to the sacral base on image 131 of series 17 demonstrate is differential enhancement, with enhancement proximally and non enhancement within the adjacent segment. Presumed transition point within the bowel on image 125 of series 17, right abdomen. There is no evidence of accumulation of high density fluid within small bowel that would suggest active extravasation. - Appendix: Appendix not visualized. - Colon: Terminal ileum at the IC valve is decompressed. Proximal colon appears to enhance within normal limits without focal inflammatory changes. Some retained high density material within the transverse colon. Diverticular disease without focal inflammatory changes or distention of the left colon and sigmoid colon. Lymphatic: No lymphadenopathy. Mesenteric: Trace fluid measuring 20 Hounsfield units adjacent to the spleen and the liver. Trace fluid within the  pericolic gutter bilaterally. Extensive edema within the small bowel mesentery associated with a nonenhancing bowel loops. Trace free fluid within the anatomic pelvis. No free air. No portal  venous gas. Reproductive: Changes of hysterectomy Other: Small fat containing umbilical hernia. Musculoskeletal: Surgical changes of right hip arthroplasty with significant streak artifact limiting evaluation of the pelvis. Degenerative changes of the left hip. Degenerative changes of the visualized thoracolumbar spine. No acute displaced fracture. Osteopenia. IMPRESSION: Small bowel ischemia within the mid and lower abdomen, demonstrating edematous, nonenhancing pattern. This is favored to be secondary to mesenteric adhesions/torsion, and not from thromboembolic arterial disease. Surgical consultation is indicated. CT is negative for evidence of acute gastrointestinal hemorrhage. Small volume reactive ascites. These above preliminary results were discussed by telephone at the time of interpretation on 02/08/2020 at 2:17 pm with Dr. Herbie Baltimore LOCKWOOD Minimal aortic atherosclerosis. Aortic Atherosclerosis (ICD10-I70.0). No significant mesenteric arterial disease. Subtle beading changes of the bilateral renal arteries, right greater than left, which may indicate fibromuscular dysplasia. 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 Additional ancillary findings as above. Signed, Dulcy Fanny. Dellia Nims, RPVI [facility] Radiology Electronically Signed   By: Corrie Mckusick D.O.   On: 02/08/2020 14:48    Anti-infectives: Anti-infectives (From admission, onward)   Start     Dose/Rate Route Frequency Ordered Stop   02/08/20 1700  meropenem (MERREM) 1 g in sodium chloride 0.9 % 100 mL IVPB     Discontinue     1 g 200 mL/hr over 30 Minutes Intravenous Every 8 hours 02/08/20 1639     02/08/20 1530  cefTRIAXone (ROCEPHIN) 2 g in sodium chloride 0.9 % 100 mL IVPB  Status:  Discontinued        2 g 200 mL/hr over 30 Minutes Intravenous Every 24 hours 02/08/20 1502 02/08/20 1639   02/08/20 1515  cefTRIAXone (ROCEPHIN) 2 g in sodium chloride 0.9 % 100 mL IVPB   Status:  Discontinued        2 g 200 mL/hr over 30 Minutes Intravenous Every 24 hours 02/08/20 1508 02/08/20 1512   02/08/20 1500  piperacillin-tazobactam (ZOSYN) IVPB 2.25 g  Status:  Discontinued        2.25 g 100 mL/hr over 30 Minutes Intravenous  Once 02/08/20 1453 02/08/20 1502       Assessment/Plan A fib on coumadin - coumadin on hold, INR 1.3 Interstitial lung disease onnighttime oxygen HTN HLD Hypothyroidism CHF (EF 60-65% 10/2019)  Peritonitis, ischemic bowel, previous achalasia -S/p Exploratory laparotomy, lysis of adhesions, small bowel resection, gastrostomy tube placement 8/9 Dr. Georgette Dover - POD#2 - surgical path pending - may be passing some flatus, good bowel sounds today  ID - merrem 8/9>> FEN - IVF, sips clears, intermittently clamp G VTE - SCDs, lovenox Foley - none Follow up - Dr. Georgette Dover  Plan - Intermittently clamp/unclamp G tube every 2 hours today and allow sips of clears. If patient becomes distended or nauseated then return G tube to gravity and back down to just ice chips. Continue PT, mobilize. Electrolyte derangements per primary. CIR following.   LOS: 2 days    Wellington Hampshire, [facility] 02/10/2020, 8:47 AM Please see Amion for pager number during day hours 7:00am-4:30pm

## 2020-02-10 NOTE — Progress Notes (Signed)
Inpatient Rehabilitation Admissions Coordinator  I met with pt's daughter, [name] at bedside to answer her questions about a possible CIR vs SNF admit. I will follow up tomorrow.  Danne Baxter, RN, MSN Rehab Admissions Coordinator [phone] 02/10/2020 12:21 PM

## 2020-02-10 NOTE — TOC Initial Note (Signed)
Transition of Care [facility]) - Initial/Assessment Note    Patient Details  Name: Stephanie Cortez MRN: [id number] Date of Birth: December 14, 1933  Transition of Care [facility]) CM/SW Contact:    Bethena Roys, RN Phone Number: 02/10/2020, 4:24 PM  Clinical Narrative: High risk for readmission assessment completed. Patient presented for abdominal pain. Patient is being followed by inpatient rehab admissions coordinator for possible admission to CIR. Case Manager will continue to follow for additional transition of care needs.     Expected Discharge Plan: IP Rehab Facility Barriers to Discharge: Continued Medical Work up   Expected Discharge Plan and Services Expected Discharge Plan: [place]   Discharge Planning Services: CM Consult   Living arrangements for the past 2 months: Single Family Home                  Prior Living Arrangements/Services Living arrangements for the past 2 months: Single Family Home Lives with::  (has support of children.) Patient language and need for interpreter reviewed:: Yes        Need for Family Participation in Patient Care: Yes (Comment) Care giver support system in place?: Yes (comment)   Criminal Activity/Legal Involvement Pertinent to Current Situation/Hospitalization: No - Comment as needed  Activities of Daily Living Home Assistive Devices/Equipment: Environmental consultant (specify type), Wheelchair ADL Screening (condition at time of admission) Patient's cognitive ability adequate to safely complete daily activities?: Yes Is the patient deaf or have difficulty hearing?: No Does the patient have difficulty seeing, even when wearing glasses/contacts?: No Does the patient have difficulty concentrating, remembering, or making decisions?: No Patient able to express need for assistance with ADLs?: Yes Does the patient have difficulty dressing or bathing?: No Independently performs ADLs?: Yes (appropriate for developmental age) Does the patient have  difficulty walking or climbing stairs?: Yes Weakness of Legs: Both Weakness of Arms/Hands: None     Emotional Assessment Appearance:: Appears stated age       Alcohol / Substance Use: Not Applicable Psych Involvement: No (comment)  Admission diagnosis:  Small bowel ischemia ([place]) [K55.9] Mesenteric ischemia ([place]) [K55.9] Patient Active Problem List   Diagnosis Date Noted  . Sepsis ([place]) 02/08/2020  . Small bowel ischemia ([place]) 02/08/2020  . Thrombocytosis ([place]) 02/08/2020  . Abnormal CT of the abdomen   . Periprosthetic fracture around internal prosthetic hip joint 11/16/2019  . Periprosthetic fracture around internal prosthetic hip joint, initial encounter   . Chronic obstructive pulmonary disease ([place])   . Essential hypertension   . Chronic anticoagulation   . Acute blood loss anemia   . Transaminitis   . Hypoalbuminemia due to protein-calorie malnutrition ([place])   . Labile blood pressure   . Drug induced constipation   . Postoperative pain   . Subcapital fracture of femur ([place]) 10/30/2019  . Post-operative state   . Prerenal azotemia   . Leukocytosis   . Closed right hip fracture, initial encounter ([place]) 10/27/2019  . Hoarseness 02/19/2018  . Sensorineural hearing loss (SNHL) of both ears 02/19/2018  . Tinnitus, bilateral 02/19/2018  . Cough 09/06/2017  . Cough variant asthma 08/19/2017  . Acute bronchitis 02/20/2016  . Pulmonary air trapping 01/04/2016  . Encounter for preoperative pulmonary examination 01/04/2016  . NSIP (nonspecific interstitial pneumonia) ([place]) 09/26/2015  . Chronic sinusitis 09/26/2015  . ILD (interstitial lung disease) ([place]) 07/12/2014  . Upper airway cough syndrome 09/23/2013  . Encounter for therapeutic drug monitoring 08/11/2013  . Congestive heart failure ([place]) 07/08/2013  . Coronary artery calcification seen on  CAT scan 03/15/2013  . Nodule of left lung 03/15/2013  . Orthostatic lightheadedness 02/09/2013  . Depression   .  Hyponatremia 04/03/2012  . Diverticulitis 01/01/2012  . GERD (gastroesophageal reflux disease) 01/01/2012  . Hypothyroid 11/23/2010  . Edema 10/04/2010  . Bronchiectasis ([place])   . HYPERTENSION, BENIGN 10/22/2008  . A-fib [facility])    PCP:  [name], MD Pharmacy:   [facility] [address] [place] [address] Phone: [phone] Fax: [phone]   Readmission Risk Interventions Readmission Risk Prevention Plan 02/10/2020  Transportation Screening Complete  HRI or [place] Complete  Social Work Consult for [place] Planning/Counseling Complete  Palliative Care Screening Not Applicable  Medication Review Press photographer) Complete  Some recent data might be hidden

## 2020-02-10 NOTE — Progress Notes (Signed)
PROGRESS NOTE    Stephanie Cortez  AUQ:[id number] DOB: 1934-06-04 DOA: 02/08/2020 PCP: [name], MD    Chief Complaint  Patient presents with   Abdominal Pain   Diarrhea   Nausea    Brief Narrative:   86 year old lady with prior history of permanent atrial fibrillation on Coumadin, colitis associated with diverticulitis, IBS, achalasia, hiatal hernia, chronic back pain, hypertension, hypothyroidism, GERD, depression, anxiety, right hip fracture s/p recent total hip replacement on 11/16/2019 presents to ED with abdominal pain and coffee-ground emesis.  She was found to have small bowel ischemia secondary to bowel adhesions. General surgery consulted.   Assessment & Plan:   Principal Problem:   Small bowel ischemia (HCC) Active Problems:   HYPERTENSION, BENIGN   A-fib (HCC)   Hypothyroid   GERD (gastroesophageal reflux disease)   Hyponatremia   Depression   Sepsis (HCC)   Thrombocytosis (HCC)   Abnormal CT of the abdomen  Sepsis secondary to small bowel ischemia, peritonitis.  Patient underwent exploratory laparotomy, lysis of additions, small bowel resection, gastrostomy tube placement by Dr. Georgette Dover on 02/08/2020.  Patient was started on meropenem, continue the same.  Patient continues to have G-tube at this time clamping trials today with clear liquid diet. PT OT evaluation recommend getting out of bed and ambulation.    Hypokalemia Replaced .  History of recent right total hip replacement PT evaluation recommending CIR.  Continue to monitor.     Essential hypertension Blood pressure parameters are borderline.     Hypothyroidism Continue with levothyroxine.  Next     Chronic atrial fibrillation Rate controlled with Cardizem gtt. And further anti coagulation as per gen surgery.    Depression and anxiety  Resume home meds.    Mild hyponatremia;  Improving with IV fluids.     DVT prophylaxis: (Lovenox) Code Status: (Full code.  Family  Communication: family at bedside.  Disposition:   Status is: Inpatient  Remains inpatient appropriate because:IV treatments appropriate due to intensity of illness or inability to take PO   Dispo:  Patient From: Home  Planned Disposition: Home with Health Care Svc  Expected discharge date: 02/12/20  Medically stable for discharge: No        Consultants:   Surgery.    Procedures: S/p exploratory laparotomy, lysis of additions, small bowel resection, gastrostomy tube placement by Dr. To ED on 02/08/2020.   Antimicrobials:  Anti-infectives (From admission, onward)   Start     Dose/Rate Route Frequency Ordered Stop   02/08/20 1700  meropenem (MERREM) 1 g in sodium chloride 0.9 % 100 mL IVPB     Discontinue     1 g 200 mL/hr over 30 Minutes Intravenous Every 8 hours 02/08/20 1639     02/08/20 1530  cefTRIAXone (ROCEPHIN) 2 g in sodium chloride 0.9 % 100 mL IVPB  Status:  Discontinued        2 g 200 mL/hr over 30 Minutes Intravenous Every 24 hours 02/08/20 1502 02/08/20 1639   02/08/20 1515  cefTRIAXone (ROCEPHIN) 2 g in sodium chloride 0.9 % 100 mL IVPB  Status:  Discontinued        2 g 200 mL/hr over 30 Minutes Intravenous Every 24 hours 02/08/20 1508 02/08/20 1512   02/08/20 1500  piperacillin-tazobactam (ZOSYN) IVPB 2.25 g  Status:  Discontinued        2.25 g 100 mL/hr over 30 Minutes Intravenous  Once 02/08/20 1453 02/08/20 1502     .    Subjective: No new  complaints.   Objective: Vitals:   02/10/20 0744 02/10/20 0751 02/10/20 1124 02/10/20 1546  BP: 137/74  (!) 129/49 (!) 126/51  Pulse: 61  62 67  Resp: 20  (!) 21 (!) 22  Temp: 98 F (36.7 C)  98.2 F (36.8 C) 98.6 F (37 C)  TempSrc: Oral  Oral Oral  SpO2: 97% 99% 99% 95%  Weight:      Height:        Intake/Output Summary (Last 24 hours) at 02/10/2020 1859 Last data filed at 02/10/2020 1616 Gross per 24 hour  Intake 3844.27 ml  Output 600 ml  Net 3244.27 ml   Filed Weights   02/08/20 1143    Weight: 74.8 kg    Examination:  General exam: Appears calm and comfortable  Respiratory system: Clear to auscultation. Respiratory effort normal. Cardiovascular system: S1 & S2 heard, RRR. No JVD,  No pedal edema. Gastrointestinal system: Abdomen is soft, colostomy in place, mid line incision. Mild tenderness. G tube in place and clamped. Central nervous system: Alert and oriented. No focal neurological deficits. Extremities: Symmetric 5 x 5 power. Skin: No rashes, lesions or ulcers Psychiatry:  Mood & affect appropriate.     Data Reviewed: I have personally reviewed following labs and imaging studies  CBC: Recent Labs  Lab 02/08/20 1150 02/08/20 1835 02/08/20 1844 02/09/20 0351 02/10/20 0453  WBC 18.5*  --   --  16.5* 10.1  NEUTROABS  --   --   --   --  8.1*  HGB 13.9 14.3 13.6 10.9* 11.6*  HCT 44.8 42.0 40.0 34.6* 37.2  MCV 92.0  --   --  91.1 92.8  PLT 513*  --   --  322 812    Basic Metabolic Panel: Recent Labs  Lab 02/08/20 1150 02/08/20 1835 02/08/20 1844 02/09/20 0351 02/10/20 0453  NA 132* 132* 131* 134* 135  K 3.8 4.0 4.0 3.9 3.7  CL 95* 97*  --  100 101  CO2 20*  --   --  28 28  GLUCOSE 175* 148*  --  131* 134*  BUN 7* 11  --  9 9  CREATININE 0.86 0.60  --  0.67 0.53  CALCIUM 9.0  --   --  8.3* 8.0*  MG  --   --   --  1.6* 2.3  PHOS  --   --   --  2.6 1.1*    GFR: Estimated Creatinine Clearance: 54.1 mL/min (by C-G formula based on SCr of 0.53 mg/dL).  Liver Function Tests: Recent Labs  Lab 02/08/20 1150 02/09/20 0351 02/10/20 0453  AST 20 15 15   ALT 11 9 9   ALKPHOS 114 59 58  BILITOT 0.8 0.5 0.4  PROT 6.8 4.8* 4.6*  ALBUMIN 2.9* 2.5* 2.2*    CBG: No results for input(s): GLUCAP in the last 168 hours.   Recent Results (from the past 240 hour(s))  SARS Coronavirus 2 by RT PCR (hospital order, performed in [facility] hospital lab) Nasopharyngeal Nasopharyngeal Swab     Status: None   Collection Time: 02/08/20  1:26 PM    Specimen: Nasopharyngeal Swab  Result Value Ref Range Status   SARS Coronavirus 2 NEGATIVE NEGATIVE Final    Comment: (NOTE) SARS-CoV-2 target nucleic acids are NOT DETECTED.  The SARS-CoV-2 RNA is generally detectable in upper and lower respiratory specimens during the acute phase of infection. The lowest concentration of SARS-CoV-2 viral copies this assay can detect is 250 copies / mL. A negative  result does not preclude SARS-CoV-2 infection and should not be used as the sole basis for treatment or other patient management decisions.  A negative result may occur with improper specimen collection / handling, submission of specimen other than nasopharyngeal swab, presence of viral mutation(s) within the areas targeted by this assay, and inadequate number of viral copies (<250 copies / mL). A negative result must be combined with clinical observations, patient history, and epidemiological information.  Fact Sheet for Patients:   StrictlyIdeas.no  Fact Sheet for Healthcare Providers: BankingDealers.co.za  This test is not yet approved or  cleared by the [place] FDA and has been authorized for detection and/or diagnosis of SARS-CoV-2 by FDA under an Emergency Use Authorization (EUA).  This EUA will remain in effect (meaning this test can be used) for the duration of the COVID-19 declaration under Section 564(b)(1) of the Act, 21 U.S.C. section 360bbb-3(b)(1), unless the authorization is terminated or revoked sooner.  Performed at [facility], [place] [address], [place], [place] [address]          Radiology Studies: DG CHEST PORT 1 VIEW  Result Date: 02/08/2020 CLINICAL DATA:  86 year old female status post left-sided PICC placement. EXAM: PORTABLE CHEST 1 VIEW COMPARISON:  Chest radiograph dated 11/15/2019. FINDINGS: Left-sided PICC with tip in the left innominate vein approximately 2.2 cm proximal to the SVC. No  pneumothorax. Bilateral upper lobe interstitial and reticular densities progressed since the prior radiograph. No pleural effusion. Stable cardiomegaly. Atherosclerotic calcification of the aorta. Degenerative changes of the spine. Old left humeral neck fracture. IMPRESSION: 1. Left-sided PICC with tip in the left innominate vein. No pneumothorax. 2. Bilateral upper lobe interstitial and reticular densities progressed since the prior radiograph. Electronically Signed   By: Anner Crete M.D.   On: 02/08/2020 20:33        Scheduled Meds:  Chlorhexidine Gluconate Cloth  6 each Topical Daily   enoxaparin (LOVENOX) injection  40 mg Subcutaneous Q24H   [START ON 02/12/2020] levothyroxine  25 mcg Intravenous Daily   mometasone-formoterol  2 puff Inhalation BID   Continuous Infusions:  sodium chloride 10 mL/hr at 02/10/20 0300   dextrose 5 % and 0.45 % NaCl with KCl 20 mEq/L 100 mL/hr at 02/10/20 1050   diltiazem (CARDIZEM) infusion 10 mg/hr (02/10/20 1050)   famotidine (PEPCID) IV 20 mg (02/10/20 1534)   lactated ringers     magnesium sulfate bolus IVPB     meropenem (MERREM) IV 1 g (02/10/20 1728)   methocarbamol (ROBAXIN) IV       LOS: 2 days        Hosie Poisson, MD [facility]   To contact the attending provider between 7A-7P or the covering provider during after hours 7P-7A, please log into the web site [email or web address] and access using universal [place] password for that web site. If you do not have the password, please call the hospital operator.  02/10/2020, 6:59 PM

## 2020-02-10 NOTE — Progress Notes (Signed)
Initial Nutrition Assessment  DOCUMENTATION CODES:   Not applicable  INTERVENTION:   Diet advancement as able per Surgery. RD to add PO supplements as appropriate when diet is advanced.   NUTRITION DIAGNOSIS:   Increased nutrient needs related to acute illness (S/P small bowel resection for ischemic bowel) as evidenced by estimated needs.  GOAL:   Patient will meet greater than or equal to 90% of their needs  MONITOR:   Diet advancement, PO intake, Skin, Labs  REASON FOR ASSESSMENT:   Malnutrition Screening Tool    ASSESSMENT:   86 yo female admitted with sepsis d/t small bowel mesenteric ischemia. S/P small bowel resection and G-tube placement on 8/9. PMH includes A fib, achalasia, diverticulitis, IBS, HTN, hypothyroidism, GERD, COPD, CHF.  G-tube is to drainage, 900 ml output x 24 hours.  Labs reviewed. Phos 1.1 (L) Medications reviewed. IVF: D5 1/2 NS with KCL 20 mEq/L at 100 ml/h  Patient has had 11% weight loss within the past month.  83.9 kg 01/12/20 to 74.8 kg 02/08/20  RD working remotely. Unable to reach patient by phone. She is at increased nutrition risk, given significant recent weight loss and GI surgery with ongoing NPO status. Suspect malnutrition, but unable to obtain enough information at this time for identification of malnutrition.   NUTRITION - FOCUSED PHYSICAL EXAM:  unable to complete  Diet Order:   Diet Order            Diet NPO time specified Except for: Ice Chips, Sips with Meds, Other (See Comments)  Diet effective now                 EDUCATION NEEDS:   No education needs have been identified at this time  Skin:  Skin Assessment: Skin Integrity Issues: Skin Integrity Issues:: Incisions Incisions: abdomen  Last BM:  8/10  Height:   Ht Readings from Last 1 Encounters:  02/08/20 5\' 6"  (1.676 m)    Weight:   Wt Readings from Last 1 Encounters:  02/08/20 74.8 kg    Ideal Body Weight:  59.1 kg  BMI:  Body mass index is  26.63 kg/m.  Estimated Nutritional Needs:   Kcal:  1700-1900  Protein:  85-100 gm  Fluid:  1.7-1.9 L    Lucas Mallow, RD, LDN, CNSC Please refer to Amion for contact information.

## 2020-02-10 NOTE — Addendum Note (Signed)
Addendum  created 02/10/20 0917 by Josephine Igo, CRNA   Order list changed

## 2020-02-11 ENCOUNTER — Inpatient Hospital Stay (HOSPITAL_COMMUNITY): Payer: Medicare Other

## 2020-02-11 DIAGNOSIS — K219 Gastro-esophageal reflux disease without esophagitis: Secondary | ICD-10-CM | POA: Diagnosis not present

## 2020-02-11 DIAGNOSIS — K559 Vascular disorder of intestine, unspecified: Secondary | ICD-10-CM | POA: Diagnosis not present

## 2020-02-11 DIAGNOSIS — I4821 Permanent atrial fibrillation: Secondary | ICD-10-CM | POA: Diagnosis not present

## 2020-02-11 DIAGNOSIS — I10 Essential (primary) hypertension: Secondary | ICD-10-CM | POA: Diagnosis not present

## 2020-02-11 LAB — PHOSPHORUS: Phosphorus: 1.7 mg/dL — ABNORMAL LOW (ref 2.5–4.6)

## 2020-02-11 LAB — PROTIME-INR
INR: 1.4 — ABNORMAL HIGH (ref 0.8–1.2)
INR: 1.3 — ABNORMAL HIGH (ref 0.8–1.2)
Prothrombin Time: 16.7 seconds — ABNORMAL HIGH (ref 11.4–15.2)
Prothrombin Time: 15.6 seconds — ABNORMAL HIGH (ref 11.4–15.2)

## 2020-02-11 LAB — BASIC METABOLIC PANEL
Anion gap: 6 (ref 5–15)
BUN: <5 mg/dL — ABNORMAL LOW (ref 8–23)
CO2: 27 mmol/L (ref 22–32)
Calcium: 8.1 mg/dL — ABNORMAL LOW (ref 8.9–10.3)
Chloride: 101 mmol/L (ref 98–111)
Creatinine, Ser: 0.45 mg/dL (ref 0.44–1.00)
GFR calc Af Amer: >60 mL/min (ref >60)
GFR calc non Af Amer: >60 mL/min (ref >60)
Glucose, Bld: 110 mg/dL — ABNORMAL HIGH (ref 70–99)
Potassium: 3.8 mmol/L (ref 3.5–5.1)
Sodium: 134 mmol/L — ABNORMAL LOW (ref 135–145)

## 2020-02-11 MED ORDER — DILTIAZEM HCL 60 MG PO TABS
60.0000 mg | ORAL_TABLET | Freq: Four times a day (QID) | ORAL | Status: DC
  Administered 2020-02-11 – 2020-02-17 (×25): 60 mg via ORAL
  Filled 2020-02-11 (×25): qty 1

## 2020-02-11 MED ORDER — FLUOXETINE HCL 10 MG PO CAPS
10.0000 mg | ORAL_CAPSULE | Freq: Every day | ORAL | Status: DC
  Administered 2020-02-11 – 2020-02-16 (×6): 10 mg via ORAL
  Filled 2020-02-11 (×6): qty 1

## 2020-02-11 MED ORDER — SODIUM PHOSPHATES 45 MMOLE/15ML IV SOLN
30.0000 mmol | Freq: Once | INTRAVENOUS | Status: AC
  Administered 2020-02-11: 30 mmol via INTRAVENOUS
  Filled 2020-02-11: qty 10

## 2020-02-11 NOTE — Progress Notes (Signed)
PROGRESS NOTE    Stephanie Cortez  OVZ:[id number] DOB: 05/06/1934 DOA: 02/08/2020 PCP: [name], MD    Chief Complaint  Patient presents with  . Abdominal Pain  . Diarrhea  . Nausea    Brief Narrative:   86 year old lady with prior history of permanent atrial fibrillation on Coumadin, colitis associated with diverticulitis, IBS, achalasia, hiatal hernia, chronic back pain, hypertension, hypothyroidism, GERD, depression, anxiety, right hip fracture s/p recent total hip replacement on 11/16/2019 presents to ED with abdominal pain and coffee-ground emesis.  She was found to have small bowel ischemia secondary to bowel adhesions. General surgery consulted and she underwent Exploratory laparotomy, lysis of adhesions, small bowel resection, gastrostomy tube placement8/9 Dr. Georgette Dover. Surgical pathology shows hemorrhagic necrosis consistent with ischemia.  G tube clamping trials going on with clear liquid diet . Currently she is able to tolerate sips of clears without any nausea, vomiting or abdominal distention.   Assessment & Plan:   Principal Problem:   Small bowel ischemia (HCC) Active Problems:   HYPERTENSION, BENIGN   A-fib (HCC)   Hypothyroid   GERD (gastroesophageal reflux disease)   Hyponatremia   Depression   Sepsis (HCC)   Thrombocytosis (HCC)   Abnormal CT of the abdomen  Sepsis secondary to small bowel ischemia, peritonitis.   Patient underwent exploratory laparotomy, lysis of additions, small bowel resection, gastrostomy tube placement by Dr. Georgette Dover on 02/08/2020.  Patient was started on meropenem, continue the same.   G tube clamping trials going on with clear liquid diet, . Awaiting bowel function to start.  PT /OT evaluation recommending CIR.  Recommend getting out of bed and ambulation encouraged. Pt afebrile and wbc wnl.  Lactic acid wnl.  Pro calcitonin is 0.20    Hypokalemia, hypophosphatemia;  Replaced   History of recent right total hip replacement PT  evaluation recommending CIR.  CIR on board.    Essential hypertension Blood pressure parameters are optimal. No changes in meds.   Hypothyroidism Continue with levothyroxine.   Chronic atrial fibrillation Rate controlled with Cardizem 60 mg every 6 hours.  Pt presented with supra therapeutic INR AND coumadin has been on hold for the procedure.  Echocardiogram from 10/2019 showed  Left ventricular ejection fraction, by estimation, is 60 to 65%. The  left ventricle has normal function. The left ventricle has no regional wall motion abnormalities. Unable to assess LV diastolic filling due to underlying atrial fibrillation Patient's CHA2DS2-VASc score is 5   Depression and anxiety  Resume fluoxetine 10 mg daily.    Mild hyponatremia;  Improved with IV fluids.   Chronic diastolic heart failure She appears to be compensated. Echocardiogram from 4/21 reviewed.    Lactic acidosis on 02/08/2020 Resolved.   DVT prophylaxis: (Lovenox) Code Status: (Full code.  Family Communication: family at bedside.  Disposition:   Status is: Inpatient  Remains inpatient appropriate because:IV treatments appropriate due to intensity of illness or inability to take PO   Dispo:  Patient From: Home  Planned Disposition: Home with Health Care Svc  Expected discharge date: 02/12/20  Medically stable for discharge: No        Consultants:   Surgery.    Procedures: S/p exploratory laparotomy, lysis of additions, small bowel resection, gastrostomy tube placement by Dr. To ED on 02/08/2020.   Antimicrobials:  Anti-infectives (From admission, onward)   Start     Dose/Rate Route Frequency Ordered Stop   02/08/20 1700  meropenem (MERREM) 1 g in sodium chloride 0.9 % 100 mL IVPB  Discontinue     1 g 200 mL/hr over 30 Minutes Intravenous Every 8 hours 02/08/20 1639     02/08/20 1530  cefTRIAXone (ROCEPHIN) 2 g in sodium chloride 0.9 % 100 mL IVPB  Status:  Discontinued        2 g 200 mL/hr  over 30 Minutes Intravenous Every 24 hours 02/08/20 1502 02/08/20 1639   02/08/20 1515  cefTRIAXone (ROCEPHIN) 2 g in sodium chloride 0.9 % 100 mL IVPB  Status:  Discontinued        2 g 200 mL/hr over 30 Minutes Intravenous Every 24 hours 02/08/20 1508 02/08/20 1512   02/08/20 1500  piperacillin-tazobactam (ZOSYN) IVPB 2.25 g  Status:  Discontinued        2.25 g 100 mL/hr over 30 Minutes Intravenous  Once 02/08/20 1453 02/08/20 1502     .    Subjective: Some left upper quadrant abd pain. No BM yet, not passing flatus.  No nausea or vomiting.  No chest pain or sob.   Objective: Vitals:   02/10/20 2003 02/11/20 0008 02/11/20 0505 02/11/20 0801  BP:  (!) 122/45 (!) 145/72 (!) 153/73  Pulse:  71 83 85  Resp:  20 (!) 25 (!) 23  Temp:  98.2 F (36.8 C) 98.3 F (36.8 C) 97.8 F (36.6 C)  TempSrc:  Oral Oral Oral  SpO2: 94% 96% 95% 97%  Weight:      Height:        Intake/Output Summary (Last 24 hours) at 02/11/2020 0858 Last data filed at 02/10/2020 1616 Gross per 24 hour  Intake 1404.72 ml  Output 600 ml  Net 804.72 ml   Filed Weights   02/08/20 1143  Weight: 74.8 kg    Examination:  General exam: Alert and comfortable not in any kind of distress. Respiratory system: Air entry fair bilateral, no wheezing or rhonchi Cardiovascular system: S1-S2 heard, irregularly irregular, no JVD, no pedal edema. Gastrointestinal system: Abdomen is soft, generalized mild tenderness present, G-tube in place and currently clamped. Central nervous system: Alert and oriented to person and place and answering all questions appropriately Extremities: No pedal edema, cyanosis Skin: No rashes seen Psychiatry: Mood is appropriate    Data Reviewed: I have personally reviewed following labs and imaging studies  CBC: Recent Labs  Lab 02/08/20 1150 02/08/20 1835 02/08/20 1844 02/09/20 0351 02/10/20 0453  WBC 18.5*  --   --  16.5* 10.1  NEUTROABS  --   --   --   --  8.1*  HGB 13.9 14.3  13.6 10.9* 11.6*  HCT 44.8 42.0 40.0 34.6* 37.2  MCV 92.0  --   --  91.1 92.8  PLT 513*  --   --  322 160    Basic Metabolic Panel: Recent Labs  Lab 02/08/20 1150 02/08/20 1150 02/08/20 1835 02/08/20 1844 02/09/20 0351 02/10/20 0453 02/11/20 0527  NA 132*   < > 132* 131* 134* 135 134*  K 3.8   < > 4.0 4.0 3.9 3.7 3.8  CL 95*  --  97*  --  100 101 101  CO2 20*  --   --   --  28 28 27   GLUCOSE 175*  --  148*  --  131* 134* 110*  BUN 7*  --  11  --  9 9 <5*  CREATININE 0.86  --  0.60  --  0.67 0.53 0.45  CALCIUM 9.0  --   --   --  8.3* 8.0* 8.1*  MG  --   --   --   --  1.6* 2.3  --   PHOS  --   --   --   --  2.6 1.1*  --    < > = values in this interval not displayed.    GFR: Estimated Creatinine Clearance: 54.1 mL/min (by C-G formula based on SCr of 0.45 mg/dL).  Liver Function Tests: Recent Labs  Lab 02/08/20 1150 02/09/20 0351 02/10/20 0453  AST 20 15 15   ALT 11 9 9   ALKPHOS 114 59 58  BILITOT 0.8 0.5 0.4  PROT 6.8 4.8* 4.6*  ALBUMIN 2.9* 2.5* 2.2*    CBG: No results for input(s): GLUCAP in the last 168 hours.   Recent Results (from the past 240 hour(s))  SARS Coronavirus 2 by RT PCR (hospital order, performed in [facility] hospital lab) Nasopharyngeal Nasopharyngeal Swab     Status: None   Collection Time: 02/08/20  1:26 PM   Specimen: Nasopharyngeal Swab  Result Value Ref Range Status   SARS Coronavirus 2 NEGATIVE NEGATIVE Final    Comment: (NOTE) SARS-CoV-2 target nucleic acids are NOT DETECTED.  The SARS-CoV-2 RNA is generally detectable in upper and lower respiratory specimens during the acute phase of infection. The lowest concentration of SARS-CoV-2 viral copies this assay can detect is 250 copies / mL. A negative result does not preclude SARS-CoV-2 infection and should not be used as the sole basis for treatment or other patient management decisions.  A negative result may occur with improper specimen collection / handling, submission of  specimen other than nasopharyngeal swab, presence of viral mutation(s) within the areas targeted by this assay, and inadequate number of viral copies (<250 copies / mL). A negative result must be combined with clinical observations, patient history, and epidemiological information.  Fact Sheet for Patients:   StrictlyIdeas.no  Fact Sheet for Healthcare Providers: BankingDealers.co.za  This test is not yet approved or  cleared by the [place] FDA and has been authorized for detection and/or diagnosis of SARS-CoV-2 by FDA under an Emergency Use Authorization (EUA).  This EUA will remain in effect (meaning this test can be used) for the duration of the COVID-19 declaration under Section 564(b)(1) of the Act, 21 U.S.C. section 360bbb-3(b)(1), unless the authorization is terminated or revoked sooner.  Performed at [facility], [place] [address] [place], [place] [address]          Radiology Studies: No results found.      Scheduled Meds: . Chlorhexidine Gluconate Cloth  6 each Topical Daily  . enoxaparin (LOVENOX) injection  40 mg Subcutaneous Q24H  . [START ON 02/12/2020] levothyroxine  25 mcg Intravenous Daily  . mometasone-formoterol  2 puff Inhalation BID   Continuous Infusions: . sodium chloride 10 mL/hr at 02/10/20 0300  . dextrose 5 % and 0.45 % NaCl with KCl 20 mEq/L 100 mL/hr at 02/10/20 2142  . famotidine (PEPCID) IV 20 mg (02/10/20 1534)  . lactated ringers    . magnesium sulfate bolus IVPB    . meropenem (MERREM) IV 1 g (02/11/20 0106)  . methocarbamol (ROBAXIN) IV 500 mg (02/10/20 2227)     LOS: 3 days        Hosie Poisson, MD [facility]   To contact the attending provider between 7A-7P or the covering provider during after hours 7P-7A, please log into the web site [email or web address] and access using universal [place] password for that web site. If you do not have the password, please  call the hospital operator.  02/11/2020, 8:58 AM

## 2020-02-11 NOTE — Progress Notes (Signed)
Physical Therapy Treatment Patient Details Name: Stephanie Cortez MRN: [id number] DOB: Mar 25, 1934 Today's Date: 02/11/2020    History of Present Illness 86yo female c/o worsening abdominal pain. Tachycardic and tachypneic, also hypotensive in the ED. CTA shows small bowel ischemia in mid/lower abdomen. Received exploratory laparotomy, small bowel resection, and gastrostomy tube placement 8/9. PMH vertigo, BLE neuropathy, IBS, HTN, COPD, CHF, A-fib, anxiety, R THA and subsequent revision, hx foot surgery    PT Comments    Pt supine in bed on entry, eager to get out of bed with therapy. Pt limited in safe mobility by abdominal pain and L hip pain with WB, in presence of decreased strength, balance and endurance. Pt is currently modAx2 for bed mobility, min Ax2 for transfers and min A for ambulation of 16 feet with RW. D/c plan continues to remain appropriate at this time. PT will continue to follow acutely.    Follow Up Recommendations  CIR     Equipment Recommendations  Rolling walker with 5" wheels;3in1 (PT)       Precautions / Restrictions Precautions Precautions: Fall;Other (comment) Precaution Comments: gastric drainage bag/abdominal incisions, recent hx of R posterior THA WBAT, posterior lean Restrictions Weight Bearing Restrictions: No    Mobility  Bed Mobility Overal bed mobility: Needs Assistance Bed Mobility: Rolling;Sidelying to Sit Rolling: Min assist Sidelying to sit: Mod assist;+2 for physical assistance       General bed mobility comments: pt abdomen noted to be distended, suggested log rolling and sidelying to sit to decrease trunk flexion, pt requires minAx2 for rolling and modAx2 for bringing trunk to upright   Transfers Overall transfer level: Needs assistance Equipment used: Rolling walker (2 wheeled) Transfers: Sit to/from [facility] Sit to Stand: +2 physical assistance;Min assist         General transfer comment: minAx2 for power up to  standing, good hand placement for power up, requires min Ax2 for steadying  Ambulation/Gait Ambulation/Gait assistance: Min assist Gait Distance (Feet): 16 Feet Assistive device: Rolling walker (2 wheeled) Gait Pattern/deviations: Step-through pattern;Decreased weight shift to left;Decreased stance time - left Gait velocity: slowed Gait velocity interpretation: <1.31 ft/sec, indicative of household ambulator General Gait Details: min A for steadying with gait, decreased weightshift due to L hip pain from prior L THA       Balance Overall balance assessment: Needs assistance Sitting-balance support: Feet supported;Bilateral upper extremity supported Sitting balance-Leahy Scale: Fair Sitting balance - Comments: initial posterior lean with min A for steadying able to progress to independent   Postural control: Posterior lean Standing balance support: Bilateral upper extremity supported;During functional activity Standing balance-Leahy Scale: Poor Standing balance comment: requires B UE support                             Cognition Arousal/Alertness: Awake/alert Behavior During Therapy: WFL for tasks assessed/performed;Flat affect Overall Cognitive Status: Within Functional Limits for tasks assessed                                 General Comments: very pleasant and cooperative, lots of experience in rehab and reports she wants to work hard to get better         General Comments General comments (skin integrity, edema, etc.): VSS on 1L O2 via [place]       Pertinent Vitals/Pain Pain Assessment: Faces Faces Pain Scale: Hurts little more Pain Location:  abdominal  Pain Descriptors / Indicators: Aching;Sore           PT Goals (current goals can now be found in the care plan section) Acute Rehab PT Goals Patient Stated Goal: go to rehab and get stronger Time For Goal Achievement: 02/23/20 Potential to Achieve Goals: Good Progress towards PT goals:  Progressing toward goals    Frequency    Min 3X/week      PT Plan Current plan remains appropriate       AM-PAC PT "6 Clicks" Mobility   Outcome Measure  Help needed turning from your back to your side while in a flat bed without using bedrails?: A Lot Help needed moving from lying on your back to sitting on the side of a flat bed without using bedrails?: A Lot Help needed moving to and from a bed to a chair (including a wheelchair)?: A Lot Help needed standing up from a chair using your arms (e.g., wheelchair or bedside chair)?: A Lot Help needed to walk in hospital room?: A Little Help needed climbing 3-5 steps with a railing? : Total 6 Click Score: 12    End of Session Equipment Utilized During Treatment: Gait belt;Oxygen Activity Tolerance: Patient tolerated treatment well;Patient limited by pain Patient left: in chair;with call bell/phone within reach;with chair alarm set Nurse Communication: Mobility status PT Visit Diagnosis: Unsteadiness on feet (R26.81);Difficulty in walking, not elsewhere classified (R26.2);Muscle weakness (generalized) (M62.81)     Time: 3729-0211 PT Time Calculation (min) (ACUTE ONLY): 25 min  Charges:  $Gait Training: 8-22 mins $Therapeutic Activity: 8-22 mins                     Kampbell Holaway B. Migdalia Dk PT, DPT Acute Rehabilitation Services Pager [phone] Office [phone]    [place] 02/11/2020, 1:55 PM

## 2020-02-11 NOTE — Progress Notes (Signed)
Inpatient Rehabilitation Admissions Coordinator  Daughter has expressed preference for Cir admit rather than SNF when medically ready. I would anticipate early next week. I will follow.  Danne Baxter, RN, MSN Rehab Admissions Coordinator [phone] 02/11/2020 5:21 PM

## 2020-02-11 NOTE — Progress Notes (Signed)
Patient's HR sustaining below 60, Cardizem drip titrated (see MAR), Opyd, MD paged. Will continue to monitor.   Elaina Hoops, RN

## 2020-02-11 NOTE — Progress Notes (Addendum)
[facility] Progress Note  3 Days Post-Op  Subjective: CC-  Tolerated intermittent G tube clamping trials. States that she still has some abdominal discomfort but denies bloating, nausea, or vomiting. No flatus or BM but feels like she may have a BM this morning.  G tube output is very light green/clear.  Objective: Vital signs in last 24 hours: Temp:  [97.8 F (36.6 C)-98.6 F (37 C)] 97.8 F (36.6 C) (08/12 0801) Pulse Rate:  [62-85] 85 (08/12 0801) Resp:  [19-25] 23 (08/12 0801) BP: (122-153)/(45-73) 153/73 (08/12 0801) SpO2:  [94 %-99 %] 97 % (08/12 0801) Last BM Date: 02/09/20  Intake/Output from previous day: 08/11 0701 - 08/12 0700 In: 1404.7 [I.V.:1294.3; IV Piggyback:110.4] Out: 600 [Drains:600] Intake/Output this shift: No intake/output data recorded.  PE: Gen:  Alert, NAD, pleasant HEENT: EOM's intact, pupils equal and round Card:  Regular rate Pulm:  rate and effort normal Abd: Soft, mild distension, +BS, midline incision cdi with staples intact and no erythema or drainage, G tube site cdi with very light green/clear drainage in bag  Lab Results:  Recent Labs    02/09/20 0351 02/10/20 0453  WBC 16.5* 10.1  HGB 10.9* 11.6*  HCT 34.6* 37.2  PLT 322 229   BMET Recent Labs    02/10/20 0453 02/11/20 0527  NA 135 134*  K 3.7 3.8  CL 101 101  CO2 28 27  GLUCOSE 134* 110*  BUN 9 <5*  CREATININE 0.53 0.45  CALCIUM 8.0* 8.1*   PT/INR Recent Labs    02/10/20 0453 02/11/20 0527  LABPROT 16.0* 16.7*  INR 1.3* 1.4*   CMP     Component Value Date/Time   NA 134 (L) 02/11/2020 0527   NA 130 (L) 02/16/2019 1452   K 3.8 02/11/2020 0527   CL 101 02/11/2020 0527   CO2 27 02/11/2020 0527   GLUCOSE 110 (H) 02/11/2020 0527   BUN <5 (L) 02/11/2020 0527   BUN 13 02/16/2019 1452   CREATININE 0.45 02/11/2020 0527   CALCIUM 8.1 (L) 02/11/2020 0527   PROT 4.6 (L) 02/10/2020 0453   ALBUMIN 2.2 (L) 02/10/2020 0453   AST 15 02/10/2020 0453    ALT 9 02/10/2020 0453   ALKPHOS 58 02/10/2020 0453   BILITOT 0.4 02/10/2020 0453   GFRNONAA >60 02/11/2020 0527   GFRAA >60 02/11/2020 0527   Lipase     Component Value Date/Time   LIPASE 29.0 10/08/2011 1046       Studies/Results: No results found.  Anti-infectives: Anti-infectives (From admission, onward)   Start     Dose/Rate Route Frequency Ordered Stop   02/08/20 1700  meropenem (MERREM) 1 g in sodium chloride 0.9 % 100 mL IVPB     Discontinue     1 g 200 mL/hr over 30 Minutes Intravenous Every 8 hours 02/08/20 1639     02/08/20 1530  cefTRIAXone (ROCEPHIN) 2 g in sodium chloride 0.9 % 100 mL IVPB  Status:  Discontinued        2 g 200 mL/hr over 30 Minutes Intravenous Every 24 hours 02/08/20 1502 02/08/20 1639   02/08/20 1515  cefTRIAXone (ROCEPHIN) 2 g in sodium chloride 0.9 % 100 mL IVPB  Status:  Discontinued        2 g 200 mL/hr over 30 Minutes Intravenous Every 24 hours 02/08/20 1508 02/08/20 1512   02/08/20 1500  piperacillin-tazobactam (ZOSYN) IVPB 2.25 g  Status:  Discontinued        2.25 g 100 mL/hr  over 30 Minutes Intravenous  Once 02/08/20 1453 02/08/20 1502       Assessment/Plan A fib on coumadin - coumadin on hold, INR 1.4 Interstitial lung disease onnighttime oxygen HTN HLD Hypothyroidism CHF (EF 60-65% 10/2019)  Peritonitis, ischemic bowel, previous achalasia -S/p Exploratory laparotomy, lysis of adhesions, small bowel resection, gastrostomy tube placement 8/9 Dr. Georgette Dover - POD#3 - surgical path: Hemorrhagic necrosis consistent with ischemia  ID - merrem 8/9>>day#3 FEN - IVF, sips clears, clamp G VTE - SCDs, lovenox Foley - none Follow up - Dr. Georgette Dover  Plan - Recheck phosphorous. Keep G tube clamped and continue sips of clears. If patient becomes distended or nauseated then return G tube to gravity. Will recheck later today for possible diet advancement. Continue PT, mobilize. CIR following. Ok to d/c antibiotics from surgical  standpoint.   LOS: 3 days    [facility] 02/11/2020, 8:36 AM Please see Amion for pager number during day hours 7:00am-4:30pm

## 2020-02-12 DIAGNOSIS — K219 Gastro-esophageal reflux disease without esophagitis: Secondary | ICD-10-CM | POA: Diagnosis not present

## 2020-02-12 DIAGNOSIS — K559 Vascular disorder of intestine, unspecified: Secondary | ICD-10-CM | POA: Diagnosis not present

## 2020-02-12 DIAGNOSIS — I10 Essential (primary) hypertension: Secondary | ICD-10-CM | POA: Diagnosis not present

## 2020-02-12 DIAGNOSIS — I4821 Permanent atrial fibrillation: Secondary | ICD-10-CM | POA: Diagnosis not present

## 2020-02-12 LAB — BASIC METABOLIC PANEL
Anion gap: 5 (ref 5–15)
BUN: <5 mg/dL — ABNORMAL LOW (ref 8–23)
CO2: 28 mmol/L (ref 22–32)
Calcium: 8.2 mg/dL — ABNORMAL LOW (ref 8.9–10.3)
Chloride: 99 mmol/L (ref 98–111)
Creatinine, Ser: 0.48 mg/dL (ref 0.44–1.00)
GFR calc Af Amer: >60 mL/min (ref >60)
GFR calc non Af Amer: >60 mL/min (ref >60)
Glucose, Bld: 115 mg/dL — ABNORMAL HIGH (ref 70–99)
Potassium: 4.3 mmol/L (ref 3.5–5.1)
Sodium: 132 mmol/L — ABNORMAL LOW (ref 135–145)

## 2020-02-12 LAB — PHOSPHORUS: Phosphorus: 3.3 mg/dL (ref 2.5–4.6)

## 2020-02-12 LAB — CBC
HCT: 28.9 % — ABNORMAL LOW (ref 36.0–46.0)
Hemoglobin: 9.1 g/dL — ABNORMAL LOW (ref 12.0–15.0)
MCH: 29.4 pg (ref 26.0–34.0)
MCHC: 31.5 g/dL (ref 30.0–36.0)
MCV: 93.5 fL (ref 80.0–100.0)
Platelets: 310 10*3/uL (ref 150–400)
RBC: 3.09 MIL/uL — ABNORMAL LOW (ref 3.87–5.11)
RDW: 15.2 % (ref 11.5–15.5)
WBC: 7.9 10*3/uL (ref 4.0–10.5)
nRBC: 0 % (ref 0.0–0.2)

## 2020-02-12 LAB — PROTIME-INR
INR: 1.4 — ABNORMAL HIGH (ref 0.8–1.2)
Prothrombin Time: 17 seconds — ABNORMAL HIGH (ref 11.4–15.2)

## 2020-02-12 LAB — MAGNESIUM: Magnesium: 1.7 mg/dL (ref 1.7–2.4)

## 2020-02-12 LAB — PREALBUMIN: Prealbumin: 9.3 mg/dL — ABNORMAL LOW (ref 18–38)

## 2020-02-12 MED ORDER — DOCUSATE SODIUM 100 MG PO CAPS
100.0000 mg | ORAL_CAPSULE | Freq: Two times a day (BID) | ORAL | Status: DC
  Administered 2020-02-12 – 2020-02-17 (×11): 100 mg via ORAL
  Filled 2020-02-12 (×11): qty 1

## 2020-02-12 NOTE — Progress Notes (Signed)
[facility] Progress Note  4 Days Post-Op  Subjective: CC-  Feels about the same as yesterday. G tube remains clamped. She reports some bloating, denies n/v. No flatus or BM.  Hgb down 9.1 from 11.6. INR 1.4 Working well with therapies, looking into CIR.  Objective: Vital signs in last 24 hours: Temp:  [98 F (36.7 C)-98.6 F (37 C)] 98.4 F (36.9 C) (08/13 0700) Pulse Rate:  [69-81] 74 (08/13 0700) Resp:  [22-23] 22 (08/13 0700) BP: (122-154)/(51-64) 139/64 (08/13 0700) SpO2:  [95 %-99 %] 99 % (08/13 0759) Last BM Date: 02/09/20  Intake/Output from previous day: 08/12 0701 - 08/13 0700 In: 3308.6 [I.V.:2579.1; IV Piggyback:729.5] Out: 1450 [Urine:1100; Drains:350] Intake/Output this shift: No intake/output data recorded.  PE: Gen: Alert, NAD, pleasant HEENT: EOM's intact, pupils equal and round Card:Regular rate Pulm: rate and effort normal Abd: Soft,mild distension, few BS heard,midline incision cdi with staples intact and no erythema or drainage, G tube site cdi/ tube is clamped  Lab Results:  Recent Labs    02/10/20 0453 02/12/20 0400  WBC 10.1 7.9  HGB 11.6* 9.1*  HCT 37.2 28.9*  PLT 229 310   BMET Recent Labs    02/11/20 0527 02/12/20 0400  NA 134* 132*  K 3.8 4.3  CL 101 99  CO2 27 28  GLUCOSE 110* 115*  BUN <5* <5*  CREATININE 0.45 0.48  CALCIUM 8.1* 8.2*   PT/INR Recent Labs    02/11/20 1103 02/12/20 0400  LABPROT 15.6* 17.0*  INR 1.3* 1.4*   CMP     Component Value Date/Time   NA 132 (L) 02/12/2020 0400   NA 130 (L) 02/16/2019 1452   K 4.3 02/12/2020 0400   CL 99 02/12/2020 0400   CO2 28 02/12/2020 0400   GLUCOSE 115 (H) 02/12/2020 0400   BUN <5 (L) 02/12/2020 0400   BUN 13 02/16/2019 1452   CREATININE 0.48 02/12/2020 0400   CALCIUM 8.2 (L) 02/12/2020 0400   PROT 4.6 (L) 02/10/2020 0453   ALBUMIN 2.2 (L) 02/10/2020 0453   AST 15 02/10/2020 0453   ALT 9 02/10/2020 0453   ALKPHOS 58 02/10/2020 0453    BILITOT 0.4 02/10/2020 0453   GFRNONAA >60 02/12/2020 0400   GFRAA >60 02/12/2020 0400   Lipase     Component Value Date/Time   LIPASE 29.0 10/08/2011 1046       Studies/Results: DG Abd 2 Views  Result Date: 02/11/2020 CLINICAL DATA:  Abdominal pain post exploratory laparotomy, small-bowel resection, and lysis of adhesions EXAM: ABDOMEN - 2 VIEW COMPARISON:  Portable exam 1033 hours compared to CT abdomen and pelvis 02/08/2020 FINDINGS: Surgical drain projects over LEFT mid abdomen. Skin clips from prior midline laparotomy. Nonobstructive bowel gas pattern. Minimal residual contrast distal colon. No bowel dilatation or bowel wall thickening. Bones demineralized with multilevel degenerative disc disease changes of the thoracolumbar spine. IMPRESSION: Nonobstructive bowel gas pattern. Electronically Signed   By: Lavonia Dana M.D.   On: 02/11/2020 12:13    Anti-infectives: Anti-infectives (From admission, onward)   Start     Dose/Rate Route Frequency Ordered Stop   02/08/20 1700  meropenem (MERREM) 1 g in sodium chloride 0.9 % 100 mL IVPB     Discontinue     1 g 200 mL/hr over 30 Minutes Intravenous Every 8 hours 02/08/20 1639     02/08/20 1530  cefTRIAXone (ROCEPHIN) 2 g in sodium chloride 0.9 % 100 mL IVPB  Status:  Discontinued  2 g 200 mL/hr over 30 Minutes Intravenous Every 24 hours 02/08/20 1502 02/08/20 1639   02/08/20 1515  cefTRIAXone (ROCEPHIN) 2 g in sodium chloride 0.9 % 100 mL IVPB  Status:  Discontinued        2 g 200 mL/hr over 30 Minutes Intravenous Every 24 hours 02/08/20 1508 02/08/20 1512   02/08/20 1500  piperacillin-tazobactam (ZOSYN) IVPB 2.25 g  Status:  Discontinued        2.25 g 100 mL/hr over 30 Minutes Intravenous  Once 02/08/20 1453 02/08/20 1502       Assessment/Plan A fib on coumadin- coumadin on hold, INR 1.4 Interstitial lung disease onnighttime oxygen HTN HLD Hypothyroidism CHF (EF 60-65% 10/2019) Malnutrition - prealbumin 9.3 (8/13),  may need to start TPN if unable to advance diet POD#6-7 ABL - hgb 9.1 from 11.6, monitor  Peritonitis, ischemic bowel, previous achalasia -S/pExploratory laparotomy, lysis of adhesions, small bowel resection, gastrostomy tube placement8/9 Dr. Georgette Dover -POD#4 - surgical path: Hemorrhagic necrosis consistent with ischemia - G tube is clamped, no flatus or BM  ID -merrem 8/9>>day#4 FEN -IVF, sips clears, clamp G VTE -SCDs, lovenox Foley -none Follow up -Dr. Georgette Dover  Plan- Keep G tube clamped, return to gravity if she becomes nauseated. continue sips of clears and await return in bowel function. Wait on restarting coumadin until reliably taking POs. With drop in hemoglobin would wait on starting heparin. Continue PT, mobilization.  CIR following. Ok to d/c antibiotics from surgical standpoint.   LOS: 4 days    [facility] 02/12/2020, 8:24 AM Please see Amion for pager number during day hours 7:00am-4:30pm

## 2020-02-12 NOTE — Care Management Important Message (Signed)
Important Message  Patient Details  Name: Stephanie Cortez MRN: [id number] Date of Birth: 1933-09-25   Medicare Important Message Given:  Yes     [name] 02/12/2020, 1:42 PM

## 2020-02-12 NOTE — Progress Notes (Signed)
PROGRESS NOTE    Stephanie Cortez  OMV:[id number] DOB: 05-01-1934 DOA: 02/08/2020 PCP: [name], MD    Chief Complaint  Patient presents with  . Abdominal Pain  . Diarrhea  . Nausea    Brief Narrative:   86 year old lady with prior history of permanent atrial fibrillation on Coumadin, colitis associated with diverticulitis, IBS, achalasia, hiatal hernia, chronic back pain, hypertension, hypothyroidism, GERD, depression, anxiety, right hip fracture s/p recent total hip replacement on 11/16/2019 presents to ED with abdominal pain and coffee-ground emesis.  She was found to have small bowel ischemia secondary to bowel adhesions. General surgery consulted and she underwent Exploratory laparotomy, lysis of adhesions, small bowel resection, gastrostomy tube placement8/9 Dr. Georgette Dover. Surgical pathology shows hemorrhagic necrosis consistent with ischemia.  G tube clamping trials going on with clear liquid diet . Currently she is able to tolerate sips of clears without any nausea, vomiting or abdominal distention.  Pt seenand examined at bedside. No BM yet, no flatus, no nausea, vomiting . Some abdominal full ness.   Assessment & Plan:   Principal Problem:   Small bowel ischemia (HCC) Active Problems:   HYPERTENSION, BENIGN   A-fib (HCC)   Hypothyroid   GERD (gastroesophageal reflux disease)   Hyponatremia   Depression   Sepsis (HCC)   Thrombocytosis (HCC)   Abnormal CT of the abdomen  Sepsis secondary to small bowel ischemia, peritonitis.   Patient underwent exploratory laparotomy, lysis of additions, small bowel resection, gastrostomy tube placement by Dr. Georgette Dover on 02/08/2020.  Patient was started on meropenem, completed 5 days of treatment.  G tube clamping trials going on with clear liquid diet, . Awaiting bowel function to start. Currently no flatus or BM today.  PT /OT evaluation recommending CIR.  Recommend getting out of bed and ambulation encouraged. Pt afebrile and wbc wnl.   Lactic acid wnl.  Pro calcitonin is 0.20    Hypokalemia, hypophosphatemia;  Replaced.    History of recent right total hip replacement PT evaluation recommending CIR.  CIR on board.    Essential hypertension Well controlled.   Hypothyroidism Continue with levothyroxine.   Chronic atrial fibrillation Rate controlled with Cardizem 60 mg every 6 hours. Change to 240 mg daily when able to tolerate soft diet.  Pt presented with supra therapeutic INR AND coumadin has been on hold for the procedure.  Echocardiogram from 10/2019 showed  Left ventricular ejection fraction, by estimation, is 60 to 65%. The  left ventricle has normal function. The left ventricle has no regional wall motion abnormalities. Unable to assess LV diastolic filling due to underlying atrial fibrillation Patient's CHA2DS2-VASc score is 5   Depression and anxiety  Resume fluoxetine 10 mg daily.    Mild hyponatremia;  Improved.   Chronic diastolic heart failure She appears to be compensated. Echocardiogram from 4/21 reviewed.    Lactic acidosis on 02/08/2020 Resolved.   DVT prophylaxis: (Lovenox) Code Status: (Full code.  Family Communication: family at bedside.  Disposition:   Status is: Inpatient  Remains inpatient appropriate because:IV treatments appropriate due to intensity of illness or inability to take PO   Dispo:  Patient From: Home  Planned Disposition: CIR  Expected discharge date: 02/15/20  Medically stable for discharge: No        Consultants:   Surgery.    Procedures: S/p exploratory laparotomy, lysis of additions, small bowel resection, gastrostomy tube placement by Dr. To ED on 02/08/2020.   Antimicrobials:  Anti-infectives (From admission, onward)   Start  Dose/Rate Route Frequency Ordered Stop   02/08/20 1700  meropenem (MERREM) 1 g in sodium chloride 0.9 % 100 mL IVPB  Status:  Discontinued        1 g 200 mL/hr over 30 Minutes Intravenous Every 8 hours  02/08/20 1639 02/12/20 0959   02/08/20 1530  cefTRIAXone (ROCEPHIN) 2 g in sodium chloride 0.9 % 100 mL IVPB  Status:  Discontinued        2 g 200 mL/hr over 30 Minutes Intravenous Every 24 hours 02/08/20 1502 02/08/20 1639   02/08/20 1515  cefTRIAXone (ROCEPHIN) 2 g in sodium chloride 0.9 % 100 mL IVPB  Status:  Discontinued        2 g 200 mL/hr over 30 Minutes Intravenous Every 24 hours 02/08/20 1508 02/08/20 1512   02/08/20 1500  piperacillin-tazobactam (ZOSYN) IVPB 2.25 g  Status:  Discontinued        2.25 g 100 mL/hr over 30 Minutes Intravenous  Once 02/08/20 1453 02/08/20 1502     .    Subjective: NO BM, flatus, nausea or vomiting. Some abd fullness.   Objective: Vitals:   02/12/20 0647 02/12/20 0700 02/12/20 0759 02/12/20 1150  BP: (!) 122/59 139/64  (!) 136/57  Pulse:  74  80  Resp:  (!) 22  18  Temp:  98.4 F (36.9 C)  98.3 F (36.8 C)  TempSrc:  Oral  Oral  SpO2:  98% 99% 96%  Weight:      Height:        Intake/Output Summary (Last 24 hours) at 02/12/2020 1520 Last data filed at 02/12/2020 1112 Gross per 24 hour  Intake 3308.57 ml  Output 1520 ml  Net 1788.57 ml   Filed Weights   02/08/20 1143  Weight: 74.8 kg    Examination:  General exam: Alert and comfortable, not in any kind of distress Respiratory system: Bilateral air entry fair, no wheezing or rhonchi Cardiovascular system: S1-S2 heard, irregularly irregular, no JVD, no pedal edema Gastrointestinal system: Abdomen is soft, generalized mild tenderness, G-tube in place connected to the drain Central nervous system: Alert and oriented, grossly nonfocal Extremities: No pedal edema or cyanosis Skin: No rashes seen Psychiatry: Mood is appropriate    Data Reviewed: I have personally reviewed following labs and imaging studies  CBC: Recent Labs  Lab 02/08/20 1150 02/08/20 1150 02/08/20 1835 02/08/20 1844 02/09/20 0351 02/10/20 0453 02/12/20 0400  WBC 18.5*  --   --   --  16.5* 10.1 7.9    NEUTROABS  --   --   --   --   --  8.1*  --   HGB 13.9   < > 14.3 13.6 10.9* 11.6* 9.1*  HCT 44.8   < > 42.0 40.0 34.6* 37.2 28.9*  MCV 92.0  --   --   --  91.1 92.8 93.5  PLT 513*  --   --   --  322 229 310   < > = values in this interval not displayed.    Basic Metabolic Panel: Recent Labs  Lab 02/08/20 1150 02/08/20 1150 02/08/20 1835 02/08/20 1835 02/08/20 1844 02/09/20 0351 02/10/20 0453 02/11/20 0527 02/11/20 1103 02/12/20 0400  NA 132*   < > 132*   < > 131* 134* 135 134*  --  132*  K 3.8   < > 4.0   < > 4.0 3.9 3.7 3.8  --  4.3  CL 95*   < > 97*  --   --  100  101 101  --  99  CO2 20*  --   --   --   --  28 28 27   --  28  GLUCOSE 175*   < > 148*  --   --  131* 134* 110*  --  115*  BUN 7*   < > 11  --   --  9 9 <5*  --  <5*  CREATININE 0.86   < > 0.60  --   --  0.67 0.53 0.45  --  0.48  CALCIUM 9.0  --   --   --   --  8.3* 8.0* 8.1*  --  8.2*  MG  --   --   --   --   --  1.6* 2.3  --   --  1.7  PHOS  --   --   --   --   --  2.6 1.1*  --  1.7* 3.3   < > = values in this interval not displayed.    GFR: Estimated Creatinine Clearance: 54.1 mL/min (by C-G formula based on SCr of 0.48 mg/dL).  Liver Function Tests: Recent Labs  Lab 02/08/20 1150 02/09/20 0351 02/10/20 0453  AST 20 15 15   ALT 11 9 9   ALKPHOS 114 59 58  BILITOT 0.8 0.5 0.4  PROT 6.8 4.8* 4.6*  ALBUMIN 2.9* 2.5* 2.2*    CBG: No results for input(s): GLUCAP in the last 168 hours.   Recent Results (from the past 240 hour(s))  SARS Coronavirus 2 by RT PCR (hospital order, performed in [facility] hospital lab) Nasopharyngeal Nasopharyngeal Swab     Status: None   Collection Time: 02/08/20  1:26 PM   Specimen: Nasopharyngeal Swab  Result Value Ref Range Status   SARS Coronavirus 2 NEGATIVE NEGATIVE Final    Comment: (NOTE) SARS-CoV-2 target nucleic acids are NOT DETECTED.  The SARS-CoV-2 RNA is generally detectable in upper and lower respiratory specimens during the acute phase of  infection. The lowest concentration of SARS-CoV-2 viral copies this assay can detect is 250 copies / mL. A negative result does not preclude SARS-CoV-2 infection and should not be used as the sole basis for treatment or other patient management decisions.  A negative result may occur with improper specimen collection / handling, submission of specimen other than nasopharyngeal swab, presence of viral mutation(s) within the areas targeted by this assay, and inadequate number of viral copies (<250 copies / mL). A negative result must be combined with clinical observations, patient history, and epidemiological information.  Fact Sheet for Patients:   StrictlyIdeas.no  Fact Sheet for Healthcare Providers: BankingDealers.co.za  This test is not yet approved or  cleared by the [place] FDA and has been authorized for detection and/or diagnosis of SARS-CoV-2 by FDA under an Emergency Use Authorization (EUA).  This EUA will remain in effect (meaning this test can be used) for the duration of the COVID-19 declaration under Section 564(b)(1) of the Act, 21 U.S.C. section 360bbb-3(b)(1), unless the authorization is terminated or revoked sooner.  Performed at [facility], [place] [address] [place], [place] [address]          Radiology Studies: DG Abd 2 Views  Result Date: 02/11/2020 CLINICAL DATA:  Abdominal pain post exploratory laparotomy, small-bowel resection, and lysis of adhesions EXAM: ABDOMEN - 2 VIEW COMPARISON:  Portable exam 1033 hours compared to CT abdomen and pelvis 02/08/2020 FINDINGS: Surgical drain projects over LEFT mid abdomen. Skin clips from prior midline  laparotomy. Nonobstructive bowel gas pattern. Minimal residual contrast distal colon. No bowel dilatation or bowel wall thickening. Bones demineralized with multilevel degenerative disc disease changes of the thoracolumbar spine. IMPRESSION: Nonobstructive bowel  gas pattern. Electronically Signed   By: Lavonia Dana M.D.   On: 02/11/2020 12:13        Scheduled Meds: . Chlorhexidine Gluconate Cloth  6 each Topical Daily  . diltiazem  60 mg Oral Q6H  . docusate sodium  100 mg Oral BID  . enoxaparin (LOVENOX) injection  40 mg Subcutaneous Q24H  . FLUoxetine  10 mg Oral QHS  . levothyroxine  25 mcg Intravenous Daily  . mometasone-formoterol  2 puff Inhalation BID   Continuous Infusions: . sodium chloride Stopped (02/10/20 1851)  . dextrose 5 % and 0.45 % NaCl with KCl 20 mEq/L 100 mL/hr at 02/12/20 0646  . famotidine (PEPCID) IV Stopped (02/11/20 1734)  . lactated ringers    . magnesium sulfate bolus IVPB    . methocarbamol (ROBAXIN) IV Stopped (02/10/20 2258)     LOS: 4 days        Hosie Poisson, MD [facility]   To contact the attending provider between 7A-7P or the covering provider during after hours 7P-7A, please log into the web site [email or web address] and access using universal  password for that web site. If you do not have the password, please call the hospital operator.  02/12/2020, 3:20 PM

## 2020-02-12 NOTE — Progress Notes (Signed)
Inpatient Rehabilitation Admissions Coordinator  I will follow up next week for possible CIR admit when patient medically ready for d/c.  Danne Baxter, RN, MSN Rehab Admissions Coordinator [phone] 02/12/2020 3:05 PM

## 2020-02-13 DIAGNOSIS — K559 Vascular disorder of intestine, unspecified: Secondary | ICD-10-CM | POA: Diagnosis not present

## 2020-02-13 DIAGNOSIS — I10 Essential (primary) hypertension: Secondary | ICD-10-CM | POA: Diagnosis not present

## 2020-02-13 DIAGNOSIS — K219 Gastro-esophageal reflux disease without esophagitis: Secondary | ICD-10-CM | POA: Diagnosis not present

## 2020-02-13 DIAGNOSIS — I4821 Permanent atrial fibrillation: Secondary | ICD-10-CM | POA: Diagnosis not present

## 2020-02-13 LAB — BASIC METABOLIC PANEL
Anion gap: 6 (ref 5–15)
BUN: <5 mg/dL — ABNORMAL LOW (ref 8–23)
CO2: 28 mmol/L (ref 22–32)
Calcium: 8.4 mg/dL — ABNORMAL LOW (ref 8.9–10.3)
Chloride: 98 mmol/L (ref 98–111)
Creatinine, Ser: 0.46 mg/dL (ref 0.44–1.00)
GFR calc Af Amer: >60 mL/min (ref >60)
GFR calc non Af Amer: >60 mL/min (ref >60)
Glucose, Bld: 114 mg/dL — ABNORMAL HIGH (ref 70–99)
Potassium: 4.7 mmol/L (ref 3.5–5.1)
Sodium: 132 mmol/L — ABNORMAL LOW (ref 135–145)

## 2020-02-13 LAB — PROTIME-INR
INR: 1.4 — ABNORMAL HIGH (ref 0.8–1.2)
Prothrombin Time: 16.6 seconds — ABNORMAL HIGH (ref 11.4–15.2)

## 2020-02-13 NOTE — Progress Notes (Signed)
PROGRESS NOTE    Stephanie Cortez  ZMO:[id number] DOB: 1934/03/09 DOA: 02/08/2020 PCP: [name], MD    Chief Complaint  Patient presents with  . Abdominal Pain  . Diarrhea  . Nausea    Brief Narrative:   86 year old lady with prior history of permanent atrial fibrillation on Coumadin, colitis associated with diverticulitis, IBS, achalasia, hiatal hernia, chronic back pain, hypertension, hypothyroidism, GERD, depression, anxiety, right hip fracture s/p recent total hip replacement on 11/16/2019 presents to ED with abdominal pain and coffee-ground emesis.  She was found to have small bowel ischemia secondary to bowel adhesions. General surgery consulted and she underwent Exploratory laparotomy, lysis of adhesions, small bowel resection, gastrostomy tube placement8/9 Dr. Georgette Dover. Surgical pathology shows hemorrhagic necrosis consistent with ischemia.  G tube clamping trials going on with clear liquid diet . Currently she is able to tolerate sips of clears without any nausea, vomiting or ab dpain.  Pt seen and examined at bedside. No new events overnight. No BM yet or flatus.  Will change to clear liquid diet.   Assessment & Plan:   Principal Problem:   Small bowel ischemia (HCC) Active Problems:   HYPERTENSION, BENIGN   A-fib (HCC)   Hypothyroid   GERD (gastroesophageal reflux disease)   Hyponatremia   Depression   Sepsis (HCC)   Thrombocytosis (HCC)   Abnormal CT of the abdomen  Sepsis secondary to small bowel ischemia, peritonitis.   Patient underwent exploratory laparotomy, lysis of additions, small bowel resection, gastrostomy tube placement by Dr. Georgette Dover on 02/08/2020.  Patient was started on meropenem, completed 5 days of treatment.  G tube clamping trials going on with clear liquid diet, . Awaiting for bowel function to resume, no BM or flatus.  PT /OT evaluation recommending CIR.  Recommend getting out of bed and ambulation encouraged. Pt afebrile and wbc wnl.  Lactic  acid wnl.  Pro calcitonin is 0.20    Hypokalemia, hypophosphatemia;  Replaced.    History of recent right total hip replacement PT evaluation recommending CIR.  CIR on board. Waiting to hear from CIR.    Essential hypertension Well controlled.   Hypothyroidism Continue with levothyroxine.   Chronic atrial fibrillation Rate  Better controlled with Cardizem 60 mg every 6 hours. Change to 240 mg daily when able to tolerate soft diet.  Pt presented with supra therapeutic INR AND coumadin has been on hold for the procedure.  Will check hemoglobin in am and slowly start her on coumadin if hemoglobin remains stable.  Echocardiogram from 10/2019 showed  Left ventricular ejection fraction, by estimation, is 60 to 65%. The  left ventricle has normal function. The left ventricle has no regional wall motion abnormalities. Unable to assess LV diastolic filling due to underlying atrial fibrillation Patient's CHA2DS2-VASc score is 5   Depression and anxiety  Resume fluoxetine 10 mg daily.    Mild hyponatremia;  Much improved.    Chronic diastolic heart failure She appears to be compensated. Echocardiogram from 4/21 reviewed.    Lactic acidosis on 02/08/2020 Resolved.   Anemia of blood loss from the procedure/ post op anemia and anemia of acute illness:  Baseline hemoglobin around 11. It dropped to 9 on 8/13, recheck hemoglobin in am.  Transfuse to keep hemoglobin greater than 7.    DVT prophylaxis: (Lovenox) Code Status: (Full code.  Family Communication: family at bedside.  Disposition:   Status is: Inpatient  Remains inpatient appropriate because:IV treatments appropriate due to intensity of illness or inability to take PO  and Inpatient level of care appropriate due to severity of illness, pt still onclear liquid diet , waiting for the bowel function to resume. Will need clearance from surgery    Dispo:  Patient From: Home  Planned Disposition: CIR  Expected discharge  date: 02/15/20  Medically stable for discharge: No        Consultants:   Surgery.    Procedures: S/p exploratory laparotomy, lysis of additions, small bowel resection, gastrostomy tube placement by Dr. To ED on 02/08/2020.   Antimicrobials:  Anti-infectives (From admission, onward)   Start     Dose/Rate Route Frequency Ordered Stop   02/08/20 1700  meropenem (MERREM) 1 g in sodium chloride 0.9 % 100 mL IVPB  Status:  Discontinued        1 g 200 mL/hr over 30 Minutes Intravenous Every 8 hours 02/08/20 1639 02/12/20 0959   02/08/20 1530  cefTRIAXone (ROCEPHIN) 2 g in sodium chloride 0.9 % 100 mL IVPB  Status:  Discontinued        2 g 200 mL/hr over 30 Minutes Intravenous Every 24 hours 02/08/20 1502 02/08/20 1639   02/08/20 1515  cefTRIAXone (ROCEPHIN) 2 g in sodium chloride 0.9 % 100 mL IVPB  Status:  Discontinued        2 g 200 mL/hr over 30 Minutes Intravenous Every 24 hours 02/08/20 1508 02/08/20 1512   02/08/20 1500  piperacillin-tazobactam (ZOSYN) IVPB 2.25 g  Status:  Discontinued        2.25 g 100 mL/hr over 30 Minutes Intravenous  Once 02/08/20 1453 02/08/20 1502     .    Subjective: No BM, flatus, no nausea, vomiting or abd pain, some full ness.   Objective: Vitals:   02/13/20 0928 02/13/20 1123 02/13/20 1409 02/13/20 1555  BP:  (!) 129/56 (!) 164/88 (!) 142/75  Pulse:  85 94 98  Resp:  (!) 23 18 (!) 25  Temp:  97.6 F (36.4 C) 97.9 F (36.6 C) 97.7 F (36.5 C)  TempSrc:  Oral Oral Oral  SpO2: 98% 96% 95% 94%  Weight:      Height:        Intake/Output Summary (Last 24 hours) at 02/13/2020 1701 Last data filed at 02/13/2020 1555 Gross per 24 hour  Intake 120 ml  Output 3100 ml  Net -2980 ml   Filed Weights   02/08/20 1143 02/13/20 0500  Weight: 74.8 kg 79.3 kg    Examination:  General exam: Alert and comfortable, not in distress. Respiratory system: Air entry fair bilateral, no wheezing or rhonchi Cardiovascular system: S1-S2 heard,  irregularly irregular, no JVD, no pedal edema Gastrointestinal system: Abdomen is soft, mildly distended, G-tube in place and clamped. Central nervous system: Alert and oriented, grossly nonfocal Extremities: No pedal edema Skin: No rashes seen Psychiatry: Mood is appropriate.     Data Reviewed: I have personally reviewed following labs and imaging studies  CBC: Recent Labs  Lab 02/08/20 1150 02/08/20 1150 02/08/20 1835 02/08/20 1844 02/09/20 0351 02/10/20 0453 02/12/20 0400  WBC 18.5*  --   --   --  16.5* 10.1 7.9  NEUTROABS  --   --   --   --   --  8.1*  --   HGB 13.9   < > 14.3 13.6 10.9* 11.6* 9.1*  HCT 44.8   < > 42.0 40.0 34.6* 37.2 28.9*  MCV 92.0  --   --   --  91.1 92.8 93.5  PLT 513*  --   --   --  322 229 310   < > = values in this interval not displayed.    Basic Metabolic Panel: Recent Labs  Lab 02/09/20 0351 02/10/20 0453 02/11/20 0527 02/11/20 1103 02/12/20 0400 02/13/20 0340  NA 134* 135 134*  --  132* 132*  K 3.9 3.7 3.8  --  4.3 4.7  CL 100 101 101  --  99 98  CO2 28 28 27   --  28 28  GLUCOSE 131* 134* 110*  --  115* 114*  BUN 9 9 <5*  --  <5* <5*  CREATININE 0.67 0.53 0.45  --  0.48 0.46  CALCIUM 8.3* 8.0* 8.1*  --  8.2* 8.4*  MG 1.6* 2.3  --   --  1.7  --   PHOS 2.6 1.1*  --  1.7* 3.3  --     GFR: Estimated Creatinine Clearance: 55.6 mL/min (by C-G formula based on SCr of 0.46 mg/dL).  Liver Function Tests: Recent Labs  Lab 02/08/20 1150 02/09/20 0351 02/10/20 0453  AST 20 15 15   ALT 11 9 9   ALKPHOS 114 59 58  BILITOT 0.8 0.5 0.4  PROT 6.8 4.8* 4.6*  ALBUMIN 2.9* 2.5* 2.2*    CBG: No results for input(s): GLUCAP in the last 168 hours.   Recent Results (from the past 240 hour(s))  SARS Coronavirus 2 by RT PCR (hospital order, performed in [facility] hospital lab) Nasopharyngeal Nasopharyngeal Swab     Status: None   Collection Time: 02/08/20  1:26 PM   Specimen: Nasopharyngeal Swab  Result Value Ref Range Status   SARS  Coronavirus 2 NEGATIVE NEGATIVE Final    Comment: (NOTE) SARS-CoV-2 target nucleic acids are NOT DETECTED.  The SARS-CoV-2 RNA is generally detectable in upper and lower respiratory specimens during the acute phase of infection. The lowest concentration of SARS-CoV-2 viral copies this assay can detect is 250 copies / mL. A negative result does not preclude SARS-CoV-2 infection and should not be used as the sole basis for treatment or other patient management decisions.  A negative result may occur with improper specimen collection / handling, submission of specimen other than nasopharyngeal swab, presence of viral mutation(s) within the areas targeted by this assay, and inadequate number of viral copies (<250 copies / mL). A negative result must be combined with clinical observations, patient history, and epidemiological information.  Fact Sheet for Patients:   StrictlyIdeas.no  Fact Sheet for Healthcare Providers: BankingDealers.co.za  This test is not yet approved or  cleared by the [place] FDA and has been authorized for detection and/or diagnosis of SARS-CoV-2 by FDA under an Emergency Use Authorization (EUA).  This EUA will remain in effect (meaning this test can be used) for the duration of the COVID-19 declaration under Section 564(b)(1) of the Act, 21 U.S.C. section 360bbb-3(b)(1), unless the authorization is terminated or revoked sooner.  Performed at [facility], [place] [address], [place], [place] [address]          Radiology Studies: No results found.      Scheduled Meds: . Chlorhexidine Gluconate Cloth  6 each Topical Daily  . diltiazem  60 mg Oral Q6H  . docusate sodium  100 mg Oral BID  . enoxaparin (LOVENOX) injection  40 mg Subcutaneous Q24H  . FLUoxetine  10 mg Oral QHS  . levothyroxine  25 mcg Intravenous Daily  . mometasone-formoterol  2 puff Inhalation BID   Continuous Infusions: .  sodium chloride Stopped (02/10/20 1851)  . dextrose 5 %  and 0.45 % NaCl with KCl 20 mEq/L 100 mL/hr at 02/13/20 0407  . famotidine (PEPCID) IV 20 mg (02/12/20 1802)  . lactated ringers    . magnesium sulfate bolus IVPB    . methocarbamol (ROBAXIN) IV Stopped (02/10/20 2258)     LOS: 5 days        Hosie Poisson, MD [facility]   To contact the attending provider between 7A-7P or the covering provider during after hours 7P-7A, please log into the web site [email or web address] and access using universal [place] password for that web site. If you do not have the password, please call the hospital operator.  02/13/2020, 5:01 PM

## 2020-02-13 NOTE — Progress Notes (Signed)
Physical Therapy Treatment Patient Details Name: Stephanie Cortez MRN: [id number] DOB: 10-24-1933 Today's Date: 02/13/2020    History of Present Illness 86yo female c/o worsening abdominal pain. Tachycardic and tachypneic, also hypotensive in the ED. CTA shows small bowel ischemia in mid/lower abdomen. Received exploratory laparotomy, small bowel resection, and gastrostomy tube placement 8/9. PMH vertigo, BLE neuropathy, IBS, HTN, COPD, CHF, A-fib, anxiety, R THA and subsequent revision, hx foot surgery    PT Comments    Pt was seen for mobility on bed, not able to agree to getting up today.  She is demonstrating struggles with a low effort movement, unsure of her ability.  Follow acutely to work toward her therapy goals and to see how she makes progress with her lower body strength and limited balance.  Follow Up Recommendations  CIR     Equipment Recommendations  Rolling walker with 5" wheels;3in1 (PT)    Recommendations for Other Services       Precautions / Restrictions Precautions Precautions: Fall Precaution Comments: gastric drainage bag/abdominal incisions, recent hx of R posterior THA WBAT, posterior lean Restrictions Weight Bearing Restrictions: No    Mobility  Bed Mobility Overal bed mobility: Needs Assistance Bed Mobility: Rolling Rolling: Mod assist         General bed mobility comments: mod assist to roll and max two to scoot up in the bed  Transfers                 General transfer comment: deferred  Ambulation/Gait                 Stairs             Wheelchair Mobility    Modified Rankin (Stroke Patients Only)       Balance Overall balance assessment: Needs assistance                                          Cognition Arousal/Alertness: Awake/alert Behavior During Therapy: WFL for tasks assessed/performed;Flat affect Overall Cognitive Status: Within Functional Limits for tasks assessed                                         Exercises General Exercises - Lower Extremity Ankle Circles/Pumps: AROM;5 reps Quad Sets: AROM;10 reps Hip ABduction/ADduction: AROM;AAROM;10 reps    General Comments        Pertinent Vitals/Pain Pain Assessment: Faces Faces Pain Scale: Hurts even more Pain Location: R hip with movement Pain Descriptors / Indicators: Grimacing;Guarding Pain Intervention(s): Limited activity within patient's tolerance;Monitored during session;Premedicated before session;Repositioned    Home Living                      Prior Function            PT Goals (current goals can now be found in the care plan section) Acute Rehab PT Goals Patient Stated Goal: go to rehab and get stronger Time For Goal Achievement: 03/11/20 Potential to Achieve Goals: Good Progress towards PT goals: Progressing toward goals    Frequency    Min 3X/week      PT Plan Current plan remains appropriate    Co-evaluation              AM-PAC PT "6 Clicks" Mobility  Outcome Measure  Help needed turning from your back to your side while in a flat bed without using bedrails?: A Little Help needed moving from lying on your back to sitting on the side of a flat bed without using bedrails?: A Little Help needed moving to and from a bed to a chair (including a wheelchair)?: A Little Help needed standing up from a chair using your arms (e.g., wheelchair or bedside chair)?: A Little Help needed to walk in hospital room?: A Little Help needed climbing 3-5 steps with a railing? : A Lot 6 Click Score: 17    End of Session Equipment Utilized During Treatment: Gait belt;Oxygen Activity Tolerance: Patient limited by pain;Patient limited by fatigue Patient left: in bed;with call bell/phone within reach;with nursing/sitter in room Nurse Communication: Mobility status;Other (comment) (the state of her abd drain) PT Visit Diagnosis: Unsteadiness on feet (R26.81);Difficulty  in walking, not elsewhere classified (R26.2);Muscle weakness (generalized) (M62.81)     Time: 7121-9758 PT Time Calculation (min) (ACUTE ONLY): 27 min  Charges:  $Therapeutic Exercise: 8-22 mins $Therapeutic Activity: 8-22 mins               Ramond Dial 02/13/2020, 9:48 PM  Mee Hives, PT MS Acute Rehab Dept. Number: [place] [facility] [place]

## 2020-02-13 NOTE — Progress Notes (Signed)
[facility]:  [phone] General Surgery Progress Note   LOS: 5 days  POD -  5 Days Post-Op  Assessment and Plan: 1.  EXPLORATORY LAPAROTOMY, SMALL BOWEL RESECTION, INSERTION OF GASTROSTOMY TUBE , LYSIS OF ADHESIONS - 8/9 - [place]  Continue clear liquids until BM, encourage ambulation  2.  A fib 3.  Anticoagulation on hold - INR - 1.4 - 02/13/2020 4.  HTN 5.  CHF 6.  Malnutrition  Prealbumin - 9.3 - 02/12/2020 7.  Anemia  Hgb - 9.1 - 02/12/2020 8.  DVT prophylaxis - Lovenox   Principal Problem:   Small bowel ischemia (HCC) Active Problems:   HYPERTENSION, BENIGN   A-fib (HCC)   Hypothyroid   GERD (gastroesophageal reflux disease)   Hyponatremia   Depression   Sepsis (HCC)   Thrombocytosis (HCC)   Abnormal CT of the abdomen  Subjective:  Getting to chair.  No BM yet.  Having pain.  Objective:   Vitals:   02/13/20 0744 02/13/20 0928  BP: (!) 163/61   Pulse: 68   Resp: 16   Temp: 98.1 F (36.7 C)   SpO2: 97% 98%     Intake/Output from previous day:  08/13 0701 - 08/14 0700 In: 120 [P.O.:120] Out: 3470 [Urine:3470]  Intake/Output this shift:  No intake/output data recorded.   Physical Exam:   General: WN F who is alert and oriented.    HEENT: Normal. Pupils equal. .   Lungs: Clear.  IS = 800 cc   Abdomen: Soft.  Rare BS   Wound: Clean.  G tube in LUQ.   Lab Results:    Recent Labs    02/12/20 0400  WBC 7.9  HGB 9.1*  HCT 28.9*  PLT 310    BMET   Recent Labs    02/12/20 0400 02/13/20 0340  NA 132* 132*  K 4.3 4.7  CL 99 98  CO2 28 28  GLUCOSE 115* 114*  BUN <5* <5*  CREATININE 0.48 0.46  CALCIUM 8.2* 8.4*    PT/INR   Recent Labs    02/12/20 0400 02/13/20 0340  LABPROT 17.0* 16.6*  INR 1.4* 1.4*    ABG  No results for input(s): PHART, HCO3 in the last 72 hours.  Invalid input(s): PCO2, PO2   Studies/Results:  DG Abd 2 Views  Result Date: 02/11/2020 CLINICAL DATA:  Abdominal pain post exploratory  laparotomy, small-bowel resection, and lysis of adhesions EXAM: ABDOMEN - 2 VIEW COMPARISON:  Portable exam 1033 hours compared to CT abdomen and pelvis 02/08/2020 FINDINGS: Surgical drain projects over LEFT mid abdomen. Skin clips from prior midline laparotomy. Nonobstructive bowel gas pattern. Minimal residual contrast distal colon. No bowel dilatation or bowel wall thickening. Bones demineralized with multilevel degenerative disc disease changes of the thoracolumbar spine. IMPRESSION: Nonobstructive bowel gas pattern. Electronically Signed   By: Lavonia Dana M.D.   On: 02/11/2020 12:13     Anti-infectives:   Anti-infectives (From admission, onward)   Start     Dose/Rate Route Frequency Ordered Stop   02/08/20 1700  meropenem (MERREM) 1 g in sodium chloride 0.9 % 100 mL IVPB  Status:  Discontinued        1 g 200 mL/hr over 30 Minutes Intravenous Every 8 hours 02/08/20 1639 02/12/20 0959   02/08/20 1530  cefTRIAXone (ROCEPHIN) 2 g in sodium chloride 0.9 % 100 mL IVPB  Status:  Discontinued        2 g 200 mL/hr over 30 Minutes Intravenous Every 24  hours 02/08/20 1502 02/08/20 1639   02/08/20 1515  cefTRIAXone (ROCEPHIN) 2 g in sodium chloride 0.9 % 100 mL IVPB  Status:  Discontinued        2 g 200 mL/hr over 30 Minutes Intravenous Every 24 hours 02/08/20 1508 02/08/20 1512   02/08/20 1500  piperacillin-tazobactam (ZOSYN) IVPB 2.25 g  Status:  Discontinued        2.25 g 100 mL/hr over 30 Minutes Intravenous  Once 02/08/20 1453 02/08/20 1502      Alphonsa Overall, MD, [facility] Surgery Office: [phone] 02/13/2020

## 2020-02-14 DIAGNOSIS — K219 Gastro-esophageal reflux disease without esophagitis: Secondary | ICD-10-CM | POA: Diagnosis not present

## 2020-02-14 DIAGNOSIS — K559 Vascular disorder of intestine, unspecified: Secondary | ICD-10-CM | POA: Diagnosis not present

## 2020-02-14 DIAGNOSIS — I10 Essential (primary) hypertension: Secondary | ICD-10-CM | POA: Diagnosis not present

## 2020-02-14 DIAGNOSIS — I4821 Permanent atrial fibrillation: Secondary | ICD-10-CM | POA: Diagnosis not present

## 2020-02-14 LAB — BASIC METABOLIC PANEL
Anion gap: 7 (ref 5–15)
BUN: <5 mg/dL — ABNORMAL LOW (ref 8–23)
CO2: 28 mmol/L (ref 22–32)
Calcium: 8.7 mg/dL — ABNORMAL LOW (ref 8.9–10.3)
Chloride: 97 mmol/L — ABNORMAL LOW (ref 98–111)
Creatinine, Ser: 0.46 mg/dL (ref 0.44–1.00)
GFR calc Af Amer: >60 mL/min (ref >60)
GFR calc non Af Amer: >60 mL/min (ref >60)
Glucose, Bld: 111 mg/dL — ABNORMAL HIGH (ref 70–99)
Potassium: 4.4 mmol/L (ref 3.5–5.1)
Sodium: 132 mmol/L — ABNORMAL LOW (ref 135–145)

## 2020-02-14 LAB — CBC
HCT: 29.8 % — ABNORMAL LOW (ref 36.0–46.0)
Hemoglobin: 9.3 g/dL — ABNORMAL LOW (ref 12.0–15.0)
MCH: 28.8 pg (ref 26.0–34.0)
MCHC: 31.2 g/dL (ref 30.0–36.0)
MCV: 92.3 fL (ref 80.0–100.0)
Platelets: 353 10*3/uL (ref 150–400)
RBC: 3.23 MIL/uL — ABNORMAL LOW (ref 3.87–5.11)
RDW: 14.7 % (ref 11.5–15.5)
WBC: 9 10*3/uL (ref 4.0–10.5)
nRBC: 0 % (ref 0.0–0.2)

## 2020-02-14 LAB — PROTIME-INR
INR: 1.4 — ABNORMAL HIGH (ref 0.8–1.2)
Prothrombin Time: 16.8 seconds — ABNORMAL HIGH (ref 11.4–15.2)

## 2020-02-14 MED ORDER — WARFARIN - PHARMACIST DOSING INPATIENT: Freq: Every day | Status: DC

## 2020-02-14 MED ORDER — WARFARIN 0.5 MG HALF TABLET
0.5000 mg | ORAL_TABLET | Freq: Once | ORAL | Status: AC
  Administered 2020-02-14: 0.5 mg via ORAL
  Filled 2020-02-14: qty 1

## 2020-02-14 MED ORDER — WARFARIN SODIUM 1 MG PO TABS: 1.0000 mg | ORAL_TABLET | Freq: Once | ORAL | Status: DC

## 2020-02-14 MED ORDER — GUAIFENESIN-DM 100-10 MG/5ML PO SYRP
5.0000 mL | ORAL_SOLUTION | ORAL | Status: DC | PRN
  Administered 2020-02-14: 5 mL via ORAL
  Filled 2020-02-14: qty 5

## 2020-02-14 NOTE — Progress Notes (Signed)
PROGRESS NOTE    Stephanie Cortez  QIW:[id number] DOB: [birth year] DOA: 02/08/2020 PCP: [name], MD    Chief Complaint  Patient presents with  . Abdominal Pain  . Diarrhea  . Nausea    Brief Narrative:   86 year old lady with prior history of permanent atrial fibrillation on Coumadin, colitis associated with diverticulitis, IBS, achalasia, hiatal hernia, chronic back pain, hypertension, hypothyroidism, GERD, depression, anxiety, right hip fracture s/p recent total hip replacement on 11/16/2019 presents to ED with abdominal pain and coffee-ground emesis.  She was found to have small bowel ischemia secondary to bowel adhesions. General surgery consulted and she underwent Exploratory laparotomy, lysis of adhesions, small bowel resection, gastrostomy tube placement8/9 Dr. Georgette Dover. Surgical pathology shows hemorrhagic necrosis consistent with ischemia.  G tube clamping trials going on with clear liquid diet . Currently she is able to tolerate sips of clears without any nausea, vomiting or ab dpain.  Patient seen and and examineD with family at bedside, patient denies any flatus or bowel movement overnight.  She reports burping . no new complaints  Assessment & Plan:   Principal Problem:   Small bowel ischemia (HCC) Active Problems:   HYPERTENSION, BENIGN   A-fib (HCC)   Hypothyroid   GERD (gastroesophageal reflux disease)   Hyponatremia   Depression   Sepsis (HCC)   Thrombocytosis (HCC)   Abnormal CT of the abdomen  Sepsis secondary to small bowel ischemia, peritonitis.   Patient underwent exploratory laparotomy, lysis of additions, small bowel resection, gastrostomy tube placement by Dr. Georgette Dover on 02/08/2020.  Patient was started on meropenem, completed 5 days of treatment.  G tube clamping trials going on with clear liquid diet,  PT /OT evaluation recommending CIR.  Recommend getting out of bed and ambulation encouraged. Pt afebrile and wbc wnl.  Lactic acid wnl.  Pro  calcitonin is 0.20 Patient able to tolerate clear liquid diet without any nausea or vomiting at this time.  Still waiting for bowel function to resume.  No bowel movement or flatus in the last 24 hours.    Hypokalemia, hypophosphatemia;  Replaced.   History of recent right total hip replacement PT evaluation recommending CIR.  CIR on board. Waiting to hear from CIR.    Essential hypertension Well-controlled.  Hypothyroidism Continue his levothyroxine.   Chronic atrial fibrillation Rate  Better controlled with Cardizem 60 mg every 6 hours. Change to 240 mg daily when able to tolerate soft diet.  Pt presented with supra therapeutic INR AND coumadin has been on hold for the procedure.  Repeat hemoglobin stable around 9 and she was started on Coumadin today. Echocardiogram from 10/2019 showed  Left ventricular ejection fraction, by estimation, is 60 to 65%. The  left ventricle has normal function. The left ventricle has no regional wall motion abnormalities. Unable to assess LV diastolic filling due to underlying atrial fibrillation Patient's CHA2DS2-VASc score is 5   Depression and anxiety  Resume fluoxetine 10 mg daily.    Mild hyponatremia;  Much improved.    Chronic diastolic heart failure She appears to be compensated.  Echocardiogram from 4/21 reviewed.    Lactic acidosis on 02/08/2020 Resolved.   Anemia of blood loss from the procedure/ post op anemia and anemia of acute illness:  Baseline hemoglobin around 11. It dropped to 9 on 8/13, recheck hemoglobin is stable around 9. Transfuse to keep hemoglobin greater than 7.    DVT prophylaxis: (Lovenox) Code Status: (Full code.  Family Communication: family at bedside.  Disposition:  Status is: Inpatient  Remains inpatient appropriate because:IV treatments appropriate due to intensity of illness or inability to take PO and Inpatient level of care appropriate due to severity of illness, pt still onclear liquid diet ,  waiting for the bowel function to resume. Will need clearance from surgery    Dispo:  Patient From: Home  Planned Disposition: CIR  Expected discharge date: 02/15/20  Medically stable for discharge: No        Consultants:   Surgery.    Procedures: S/p exploratory laparotomy, lysis of additions, small bowel resection, gastrostomy tube placement by Dr. To ED on 02/08/2020.   Antimicrobials:  Anti-infectives (From admission, onward)   Start     Dose/Rate Route Frequency Ordered Stop   02/08/20 1700  meropenem (MERREM) 1 g in sodium chloride 0.9 % 100 mL IVPB  Status:  Discontinued        1 g 200 mL/hr over 30 Minutes Intravenous Every 8 hours 02/08/20 1639 02/12/20 0959   02/08/20 1530  cefTRIAXone (ROCEPHIN) 2 g in sodium chloride 0.9 % 100 mL IVPB  Status:  Discontinued        2 g 200 mL/hr over 30 Minutes Intravenous Every 24 hours 02/08/20 1502 02/08/20 1639   02/08/20 1515  cefTRIAXone (ROCEPHIN) 2 g in sodium chloride 0.9 % 100 mL IVPB  Status:  Discontinued        2 g 200 mL/hr over 30 Minutes Intravenous Every 24 hours 02/08/20 1508 02/08/20 1512   02/08/20 1500  piperacillin-tazobactam (ZOSYN) IVPB 2.25 g  Status:  Discontinued        2.25 g 100 mL/hr over 30 Minutes Intravenous  Once 02/08/20 1453 02/08/20 1502     .    Subjective: Patient denies any chest pain, shortness of breath nausea or vomiting.  Still no flatus and bowel movement.  Recommended to get out of bed and ambulate as tolerated.  Objective: Vitals:   02/14/20 0445 02/14/20 0713 02/14/20 0829 02/14/20 1629  BP: 127/60  (!) 129/55 (!) 131/59  Pulse: 81  75 74  Resp: (!) 22   20  Temp: 98.9 F (37.2 C)  98 F (36.7 C) 97.9 F (36.6 C)  TempSrc: Oral  Oral Oral  SpO2: 99% 99% 97% 100%  Weight: 76.4 kg     Height:        Intake/Output Summary (Last 24 hours) at 02/14/2020 1751 Last data filed at 02/14/2020 0831 Gross per 24 hour  Intake --  Output 3350 ml  Net -3350 ml   Filed Weights    02/13/20 0500 02/14/20 0445  Weight: 79.3 kg 76.4 kg    Examination:  General exam: Alert and comfortable, not in any kind of distress. Respiratory system: Air entry fair bilateral, no wheezing or rhonchi Cardiovascular system: S1-S2 heard, irregularly irregular, no JVD, no pedal edema Gastrointestinal system: Abdome soft, mildly distended, no tenderness, G-tube in place and clamped.. Central nervous system: Alert and oriented, grossly nonfocal Extremities: No cyanosis or clubbing Skin: No rashes seen Psychiatry: Mood is appropriate    Data Reviewed: I have personally reviewed following labs and imaging studies  CBC: Recent Labs  Lab 02/08/20 1150 02/08/20 1835 02/08/20 1844 02/09/20 0351 02/10/20 0453 02/12/20 0400 02/14/20 0545  WBC 18.5*  --   --  16.5* 10.1 7.9 9.0  NEUTROABS  --   --   --   --  8.1*  --   --   HGB 13.9   < > 13.6 10.9* 11.6*  9.1* 9.3*  HCT 44.8   < > 40.0 34.6* 37.2 28.9* 29.8*  MCV 92.0  --   --  91.1 92.8 93.5 92.3  PLT 513*  --   --  322 229 310 353   < > = values in this interval not displayed.    Basic Metabolic Panel: Recent Labs  Lab 02/09/20 0351 02/09/20 0351 02/10/20 0453 02/11/20 0527 02/11/20 1103 02/12/20 0400 02/13/20 0340 02/14/20 0545  NA 134*   < > 135 134*  --  132* 132* 132*  K 3.9   < > 3.7 3.8  --  4.3 4.7 4.4  CL 100   < > 101 101  --  99 98 97*  CO2 28   < > 28 27  --  28 28 28   GLUCOSE 131*   < > 134* 110*  --  115* 114* 111*  BUN 9   < > 9 <5*  --  <5* <5* <5*  CREATININE 0.67   < > 0.53 0.45  --  0.48 0.46 0.46  CALCIUM 8.3*   < > 8.0* 8.1*  --  8.2* 8.4* 8.7*  MG 1.6*  --  2.3  --   --  1.7  --   --   PHOS 2.6  --  1.1*  --  1.7* 3.3  --   --    < > = values in this interval not displayed.    GFR: Estimated Creatinine Clearance: 54.6 mL/min (by C-G formula based on SCr of 0.46 mg/dL).  Liver Function Tests: Recent Labs  Lab 02/08/20 1150 02/09/20 0351 02/10/20 0453  AST 20 15 15   ALT 11 9 9    ALKPHOS 114 59 58  BILITOT 0.8 0.5 0.4  PROT 6.8 4.8* 4.6*  ALBUMIN 2.9* 2.5* 2.2*    CBG: No results for input(s): GLUCAP in the last 168 hours.   Recent Results (from the past 240 hour(s))  SARS Coronavirus 2 by RT PCR (hospital order, performed in [facility] hospital lab) Nasopharyngeal Nasopharyngeal Swab     Status: None   Collection Time: 02/08/20  1:26 PM   Specimen: Nasopharyngeal Swab  Result Value Ref Range Status   SARS Coronavirus 2 NEGATIVE NEGATIVE Final    Comment: (NOTE) SARS-CoV-2 target nucleic acids are NOT DETECTED.  The SARS-CoV-2 RNA is generally detectable in upper and lower respiratory specimens during the acute phase of infection. The lowest concentration of SARS-CoV-2 viral copies this assay can detect is 250 copies / mL. A negative result does not preclude SARS-CoV-2 infection and should not be used as the sole basis for treatment or other patient management decisions.  A negative result may occur with improper specimen collection / handling, submission of specimen other than nasopharyngeal swab, presence of viral mutation(s) within the areas targeted by this assay, and inadequate number of viral copies (<250 copies / mL). A negative result must be combined with clinical observations, patient history, and epidemiological information.  Fact Sheet for Patients:   StrictlyIdeas.no  Fact Sheet for Healthcare Providers: BankingDealers.co.za  This test is not yet approved or  cleared by the [place] FDA and has been authorized for detection and/or diagnosis of SARS-CoV-2 by FDA under an Emergency Use Authorization (EUA).  This EUA will remain in effect (meaning this test can be used) for the duration of the COVID-19 declaration under Section 564(b)(1) of the Act, 21 U.S.C. section 360bbb-3(b)(1), unless the authorization is terminated or revoked sooner.  Performed at [facility]  Lab, [address] [place], [place] [address]          Radiology Studies: No results found.      Scheduled Meds: . Chlorhexidine Gluconate Cloth  6 each Topical Daily  . diltiazem  60 mg Oral Q6H  . docusate sodium  100 mg Oral BID  . enoxaparin (LOVENOX) injection  40 mg Subcutaneous Q24H  . FLUoxetine  10 mg Oral QHS  . levothyroxine  25 mcg Intravenous Daily  . mometasone-formoterol  2 puff Inhalation BID  . Warfarin - Pharmacist Dosing Inpatient   Does not apply q1600   Continuous Infusions: . sodium chloride Stopped (02/10/20 1851)  . dextrose 5 % and 0.45 % NaCl with KCl 20 mEq/L 100 mL/hr at 02/13/20 0407  . famotidine (PEPCID) IV 20 mg (02/14/20 1645)  . lactated ringers    . magnesium sulfate bolus IVPB    . methocarbamol (ROBAXIN) IV Stopped (02/10/20 2258)     LOS: 6 days        Hosie Poisson, MD [facility]   To contact the attending provider between 7A-7P or the covering provider during after hours 7P-7A, please log into the web site [email or web address] and access using universal [place] password for that web site. If you do not have the password, please call the hospital operator.  02/14/2020, 5:51 PM

## 2020-02-14 NOTE — Progress Notes (Addendum)
ANTICOAGULATION CONSULT NOTE - Initial Consult  Pharmacy Consult for warfarin Indication: atrial fibrillation  Allergies  Allergen Reactions  . Doxycycline Other (See Comments)    Rash on face and neck  . Vicodin [Hydrocodone-Acetaminophen]     Hallucinations   . Flagyl [Metronidazole] Nausea And Vomiting  . Moxifloxacin Other (See Comments)     Weakness/fatigue  . Pantoprazole Sodium Rash  . Penicillins Rash    Has patient had a PCN reaction causing immediate rash, facial/tongue/throat swelling, SOB or lightheadedness with hypotension:unsure Has patient had a PCN reaction causing severe rash involving mucus membranes or skin necrosis:unsure Has patient had a PCN reaction that required hospitalization:No Has patient had a PCN reaction occurring within the last 10 years:Yes Cannot recall exact reaction due to time lapse If all of the above answers are "NO", then may proceed with Cephalosporin use.     Patient Measurements: Height: 5\' 6"  (167.6 cm) Weight: 76.4 kg (168 lb 6.9 oz) IBW/kg (Calculated) : 59.3  Vital Signs: Temp: 98.9 F (37.2 C) (08/15 0445) Temp Source: Oral (08/15 0445) BP: 127/60 (08/15 0445) Pulse Rate: 81 (08/15 0445)  Labs: Recent Labs    02/12/20 0400 02/13/20 0340 02/14/20 0545  HGB 9.1*  --  9.3*  HCT 28.9*  --  29.8*  PLT 310  --  353  LABPROT 17.0* 16.6* 16.8*  INR 1.4* 1.4* 1.4*  CREATININE 0.48 0.46 0.46    Estimated Creatinine Clearance: 54.6 mL/min (by C-G formula based on SCr of 0.46 mg/dL).   Medical History: Past Medical History:  Diagnosis Date  . Anemia    - Hgb 9.7gm% on 07/13/2008 in [place] -  Hgg 129gm% wiht normal irone levsl and ferritin 10/27/2008 in [place] Recurrent otitis/sinusitis  . Anxiety    chronic BZ prn  . Atrial fibrillation (HCC)    chronic anticoag  . Bronchiectasis    >PFT 07/13/2008 in [place] 1.9L/76%, FVC 2.45L/74, Ratio 79, TLC 121%, DLCO 64%  AE BRonchiectasis - Dec 2010.New Rx:  outpatient - Feb  2011 - Rx outpatient  . CHF (congestive heart failure) ([place])   . COPD (chronic obstructive pulmonary disease) (HCC)    bronchiectasis  . Cricopharyngeal achalasia   . Depression   . Diverticulosis   . Dyslipidemia   . Eczema   . GERD with stricture   . Glaucoma   . H. pylori infection   . Hypertension   . Hyponatremia    chronic, s/p endo eval 06/2012  . Hypothyroid   . IBS (irritable bowel syndrome)   . Lumbar disc disease   . Neuropathy of both feet   . Segmental colitis ([place])   . Vertigo   . Wears glasses     Medications:  Scheduled:  . Chlorhexidine Gluconate Cloth  6 each Topical Daily  . diltiazem  60 mg Oral Q6H  . docusate sodium  100 mg Oral BID  . enoxaparin (LOVENOX) injection  40 mg Subcutaneous Q24H  . FLUoxetine  10 mg Oral QHS  . levothyroxine  25 mcg Intravenous Daily  . mometasone-formoterol  2 puff Inhalation BID   Infusions:  . sodium chloride Stopped (02/10/20 1851)  . dextrose 5 % and 0.45 % NaCl with KCl 20 mEq/L 100 mL/hr at 02/13/20 0407  . famotidine (PEPCID) IV 20 mg (02/13/20 1806)  . lactated ringers    . magnesium sulfate bolus IVPB    . methocarbamol (ROBAXIN) IV Stopped (02/10/20 2258)   PRN: acetaminophen **OR** acetaminophen, methocarbamol (ROBAXIN) IV, morphine  injection, ondansetron **OR** ondansetron (ZOFRAN) IV Anti-infectives (From admission, onward)   Start     Dose/Rate Route Frequency Ordered Stop   02/08/20 1700  meropenem (MERREM) 1 g in sodium chloride 0.9 % 100 mL IVPB  Status:  Discontinued        1 g 200 mL/hr over 30 Minutes Intravenous Every 8 hours 02/08/20 1639 02/12/20 0959   02/08/20 1530  cefTRIAXone (ROCEPHIN) 2 g in sodium chloride 0.9 % 100 mL IVPB  Status:  Discontinued        2 g 200 mL/hr over 30 Minutes Intravenous Every 24 hours 02/08/20 1502 02/08/20 1639   02/08/20 1515  cefTRIAXone (ROCEPHIN) 2 g in sodium chloride 0.9 % 100 mL IVPB  Status:  Discontinued        2 g 200 mL/hr over 30 Minutes  Intravenous Every 24 hours 02/08/20 1508 02/08/20 1512   02/08/20 1500  piperacillin-tazobactam (ZOSYN) IVPB 2.25 g  Status:  Discontinued        2.25 g 100 mL/hr over 30 Minutes Intravenous  Once 02/08/20 1453 02/08/20 1502      Assessment: 86 yo female with a history of permanent atrial fibrillation presents with abdominal pain and coffee-ground emesis. The patient was found to have small bowel ischemia and underwent exploratory laparotomy, lysis of adhesions, small bowel resection and gastrostomy tube placementon 02/08/20. The patient's INR upon admit was supratherapeutic at 3.3 and the patient received KCentra 1625 units once prior to surgery.  PTA the patient takes warfarin for their atrial fibrillation. Per the last dispense report, it seems as thought the patient takes 1.25 mg PO daily, but family is being contacted to confirm this. While inpatient, the patient is also on enoxaparin 40 mg SubQ daily for prophylaxis. Will start to bridge the patient on warfarin and enoxaparin but start warfarin at a conservative dose due to the patient's previous coffee-ground emesis, low PTA dose and lack of diet or feeding.  Goal of Therapy:  INR 2-3 Monitor platelets by anticoagulation protocol: Yes   Plan:  Warfarin 0.5 mg PO tonight Enoxaparin 40 mg SubQ q24h Obtain daily PT/INR and CBC Monitor for signs and symptoms of bleeding  Shauna Hugh, PharmD, [place]  PGY-1 Pharmacy Resident 02/14/2020 7:48 AM  Please check [email or web address] for unit-specific pharmacy phone numbers.

## 2020-02-14 NOTE — Progress Notes (Addendum)
[facility] Office:  [phone] General Surgery Progress Note   LOS: 6 days  POD -  6 Days Post-Op  Assessment and Plan: 1.  EXPLORATORY LAPAROTOMY, SMALL BOWEL RESECTION, INSERTION OF GASTROSTOMY TUBE , LYSIS OF ADHESIONS - 8/9 - Tsuei  Still no BM, will continue clear liquids until BM, encourage ambulation  2.  A fib 3.  Anticoagulation on hold - INR - 1.4 - 02/14/2020 4.  HTN 5.  CHF 6.  Malnutrition  Prealbumin - 9.3 - 02/12/2020 7.  Anemia  Hgb - 9.3 - 02/14/2020 8.  DVT prophylaxis - Lovenox   Principal Problem:   Small bowel ischemia (HCC) Active Problems:   HYPERTENSION, BENIGN   A-fib (HCC)   Hypothyroid   GERD (gastroesophageal reflux disease)   Hyponatremia   Depression   Sepsis (HCC)   Thrombocytosis (HCC)   Abnormal CT of the abdomen  Subjective:  Looks good, but no BM yet.  Needs to ambulate more, though PT is working with her.  She is not a fan of rehab.  Objective:   Vitals:   02/14/20 0713 02/14/20 0829  BP:  (!) 129/55  Pulse:  75  Resp:    Temp:  98 F (36.7 C)  SpO2: 99% 97%     Intake/Output from previous day:  08/14 0701 - 08/15 0700 In: -  Out: 3700 [Urine:3700]  Intake/Output this shift:  Total I/O In: -  Out: 800 [Urine:800]   Physical Exam:   General: Older WN F who is alert and oriented.    HEENT: Normal. Pupils equal. .   Lungs: Clear.     Abdomen: Soft.  Rare BS   Wound: Clean.  G tube in LUQ.   Lab Results:    Recent Labs    02/12/20 0400 02/14/20 0545  WBC 7.9 9.0  HGB 9.1* 9.3*  HCT 28.9* 29.8*  PLT 310 353    BMET   Recent Labs    02/13/20 0340 02/14/20 0545  NA 132* 132*  K 4.7 4.4  CL 98 97*  CO2 28 28  GLUCOSE 114* 111*  BUN <5* <5*  CREATININE 0.46 0.46  CALCIUM 8.4* 8.7*    PT/INR   Recent Labs    02/13/20 0340 02/14/20 0545  LABPROT 16.6* 16.8*  INR 1.4* 1.4*    ABG  No results for input(s): PHART, HCO3 in the last 72 hours.  Invalid input(s): PCO2,  PO2   Studies/Results:  No results found.   Anti-infectives:   Anti-infectives (From admission, onward)   Start     Dose/Rate Route Frequency Ordered Stop   02/08/20 1700  meropenem (MERREM) 1 g in sodium chloride 0.9 % 100 mL IVPB  Status:  Discontinued        1 g 200 mL/hr over 30 Minutes Intravenous Every 8 hours 02/08/20 1639 02/12/20 0959   02/08/20 1530  cefTRIAXone (ROCEPHIN) 2 g in sodium chloride 0.9 % 100 mL IVPB  Status:  Discontinued        2 g 200 mL/hr over 30 Minutes Intravenous Every 24 hours 02/08/20 1502 02/08/20 1639   02/08/20 1515  cefTRIAXone (ROCEPHIN) 2 g in sodium chloride 0.9 % 100 mL IVPB  Status:  Discontinued        2 g 200 mL/hr over 30 Minutes Intravenous Every 24 hours 02/08/20 1508 02/08/20 1512   02/08/20 1500  piperacillin-tazobactam (ZOSYN) IVPB 2.25 g  Status:  Discontinued        2.25 g 100 mL/hr  over 30 Minutes Intravenous  Once 02/08/20 1453 02/08/20 Sharpes, MD, [facility] Surgery Office: [phone] 02/14/2020

## 2020-02-15 DIAGNOSIS — I4821 Permanent atrial fibrillation: Secondary | ICD-10-CM | POA: Diagnosis not present

## 2020-02-15 DIAGNOSIS — K219 Gastro-esophageal reflux disease without esophagitis: Secondary | ICD-10-CM | POA: Diagnosis not present

## 2020-02-15 DIAGNOSIS — I10 Essential (primary) hypertension: Secondary | ICD-10-CM | POA: Diagnosis not present

## 2020-02-15 DIAGNOSIS — K559 Vascular disorder of intestine, unspecified: Secondary | ICD-10-CM | POA: Diagnosis not present

## 2020-02-15 LAB — CBC
HCT: 30.7 % — ABNORMAL LOW (ref 36.0–46.0)
Hemoglobin: 9.5 g/dL — ABNORMAL LOW (ref 12.0–15.0)
MCH: 28.2 pg (ref 26.0–34.0)
MCHC: 30.9 g/dL (ref 30.0–36.0)
MCV: 91.1 fL (ref 80.0–100.0)
Platelets: 392 10*3/uL (ref 150–400)
RBC: 3.37 MIL/uL — ABNORMAL LOW (ref 3.87–5.11)
RDW: 14.5 % (ref 11.5–15.5)
WBC: 9.7 10*3/uL (ref 4.0–10.5)
nRBC: 0 % (ref 0.0–0.2)

## 2020-02-15 LAB — BASIC METABOLIC PANEL
Anion gap: 9 (ref 5–15)
BUN: <5 mg/dL — ABNORMAL LOW (ref 8–23)
CO2: 27 mmol/L (ref 22–32)
Calcium: 8.8 mg/dL — ABNORMAL LOW (ref 8.9–10.3)
Chloride: 97 mmol/L — ABNORMAL LOW (ref 98–111)
Creatinine, Ser: 0.47 mg/dL (ref 0.44–1.00)
GFR calc Af Amer: >60 mL/min (ref >60)
GFR calc non Af Amer: >60 mL/min (ref >60)
Glucose, Bld: 108 mg/dL — ABNORMAL HIGH (ref 70–99)
Potassium: 4 mmol/L (ref 3.5–5.1)
Sodium: 133 mmol/L — ABNORMAL LOW (ref 135–145)

## 2020-02-15 LAB — PROTIME-INR
INR: 1.5 — ABNORMAL HIGH (ref 0.8–1.2)
Prothrombin Time: 17.3 seconds — ABNORMAL HIGH (ref 11.4–15.2)

## 2020-02-15 MED ORDER — BOOST / RESOURCE BREEZE PO LIQD CUSTOM
1.0000 | Freq: Three times a day (TID) | ORAL | Status: DC
  Administered 2020-02-15 – 2020-02-17 (×6): 1 via ORAL

## 2020-02-15 MED ORDER — LEVOTHYROXINE SODIUM 50 MCG PO TABS
50.0000 ug | ORAL_TABLET | Freq: Every day | ORAL | Status: DC
  Administered 2020-02-16 – 2020-02-17 (×2): 50 ug via ORAL
  Filled 2020-02-15 (×2): qty 1

## 2020-02-15 MED ORDER — POLYETHYLENE GLYCOL 3350 17 G PO PACK
17.0000 g | PACK | Freq: Every day | ORAL | Status: DC
  Administered 2020-02-15 – 2020-02-17 (×3): 17 g via ORAL
  Filled 2020-02-15 (×3): qty 1

## 2020-02-15 MED ORDER — WARFARIN 0.5 MG HALF TABLET
0.5000 mg | ORAL_TABLET | Freq: Once | ORAL | Status: AC
  Administered 2020-02-15: 0.5 mg via ORAL
  Filled 2020-02-15: qty 1

## 2020-02-15 NOTE — H&P (Signed)
Physical Medicine and Rehabilitation Admission H&P    Chief Complaint  Patient presents with  . Abdominal Pain  . Diarrhea  . Nausea  : HPI: Stephanie Cortez is an 86 year old right-handed female with history of atrial fibrillation maintained on Coumadin, interstitial lung disease maintained on oxygen, anxiety, diastolic congestive heart failure, COPD, hypertension, chronic back pain, history of right hip fracture status post right hip hemiarthroplasty and received inpatient rehab services 10/30/2019 to 11/16/2019 , hyperlipidemia, colitis associate with diverticulitis/IBS.  History taken from chart review, daughter, and patient.  Patient lives alone.  1 level home 2 steps to entry.  Independent with assistive device.  She presented on 02/08/2020 with abdominal pain, coffee-ground emesis as well as nausea and vomiting.  CT angiogram showed what appeared to be small bowel ischemia in the mid lower abdomen likely secondary to torsion of the mesentery.  She was given Kcentra to reverse INR of 3.3.  Admission chemistry sodium 132, glucose 175, WBC 18,500, hemoglobin 13.9, lactic acid 2.4.  Patient underwent exploratory laparotomy lysis of adhesions small bowel resection gastrostomy tube placement 02/08/2020 per Dr.Tsuei.  Surgical pathology hemorrhagic necrosis consistent with ischemia.  Working with clamping of G-tube and diet slowly advanced.  Chronic Coumadin has been resumed.  Acute blood loss anemia of 9.6 and latest INR of 1.4.  Therapy evaluations completed with recommendations of inpatient rehab services. Please see preadmission assessment from earlier today.   Review of Systems  Constitutional: Positive for malaise/fatigue. Negative for chills.  HENT: Negative for hearing loss.   Eyes: Negative for blurred vision and double vision.  Respiratory: Negative for cough.   Cardiovascular: Negative for chest pain and palpitations.  Gastrointestinal: Positive for abdominal pain. Negative for heartburn.        GERD  Genitourinary: Negative for dysuria, flank pain and hematuria.  Musculoskeletal: Positive for joint pain and myalgias.  Skin: Negative for rash.  Neurological: Positive for weakness.  Psychiatric/Behavioral: Positive for depression.       Anxiety  All other systems reviewed and are negative.  Past Medical History:  Diagnosis Date  . Anemia    - Hgb 9.7gm% on 07/13/2008 in [place] -  Hgg 129gm% wiht normal irone levsl and ferritin 10/27/2008 in [place] Recurrent otitis/sinusitis  . Anxiety    chronic BZ prn  . Atrial fibrillation (HCC)    chronic anticoag  . Bronchiectasis    >PFT 07/13/2008 in [place] 1.9L/76%, FVC 2.45L/74, Ratio 79, TLC 121%, DLCO 64%  AE BRonchiectasis - Dec 2010.New Rx:  outpatient - Feb 2011 - Rx outpatient  . CHF (congestive heart failure) ([place])   . COPD (chronic obstructive pulmonary disease) (HCC)    bronchiectasis  . Cricopharyngeal achalasia   . Depression   . Diverticulosis   . Dyslipidemia   . Eczema   . GERD with stricture   . Glaucoma   . H. pylori infection   . Hypertension   . Hyponatremia    chronic, s/p endo eval 06/2012  . Hypothyroid   . IBS (irritable bowel syndrome)   . Lumbar disc disease   . Neuropathy of both feet   . Segmental colitis ([place])   . Vertigo   . Wears glasses    Past Surgical History:  Procedure Laterality Date  . BOWEL RESECTION N/A 02/08/2020   Procedure: SMALL BOWEL RESECTION;  Surgeon: Donnie Mesa, MD;  Location: [facility];  Service: General;  Laterality: N/A;  . BREAST SURGERY     br bx  .  CARDIAC CATHETERIZATION  07/01/2013  . CATARACT EXTRACTION     both  . COLONOSCOPY    . ESOPHAGOSCOPY W/ BOTOX INJECTION  12/11/2011   Procedure: ESOPHAGOSCOPY WITH BOTOX INJECTION;  Surgeon: Rozetta Nunnery, MD;  Location: [facility];  Service: ENT;  Laterality: N/A;  esophogoscopy with dilation, botox injection  . FOOT SURGERY  03/14/2011   gastroc slide-rt  . GASTROSTOMY N/A 02/08/2020    Procedure: INSERTION OF GASTROSTOMY TUBE;  Surgeon: Donnie Mesa, MD;  Location: [facility];  Service: General;  Laterality: N/A;  . HIP ARTHROPLASTY Right 10/28/2019   Procedure: ARTHROPLASTY BIPOLAR HIP (HEMIARTHROPLASTY);  Surgeon: Marybelle Killings, MD;  Location: [facility];  Service: Orthopedics;  Laterality: Right;  . LAPAROTOMY N/A 02/08/2020   Procedure: EXPLORATORY LAPAROTOMY;  Surgeon: Donnie Mesa, MD;  Location: [facility];  Service: General;  Laterality: N/A;  . LYSIS OF ADHESION N/A 02/08/2020   Procedure: LYSIS OF ADHESIONS;  Surgeon: Donnie Mesa, MD;  Location: [facility];  Service: General;  Laterality: N/A;  . TOTAL ABDOMINAL HYSTERECTOMY    . TOTAL HIP REVISION Right 11/16/2019   Procedure: TOTAL HIP REVISION BIPOLAR TO CEMENTED BIPOLAR;  Surgeon: Marybelle Killings, MD;  Location: [facility];  Service: Orthopedics;  Laterality: Right;  . WISDOM TOOTH EXTRACTION     Family History  Problem Relation Age of Onset  . Hypertension Mother   . Stroke Mother   . Emphysema Brother   . Other Father        miner's lung  . Cancer Father        Lung  . Colon polyps Sister   . Pancreatic cancer Sister   . Kidney disease Sister   . Atrial fibrillation Other        siblings   Social History:  reports that she has never smoked. She has never used smokeless tobacco. She reports that she does not drink alcohol and does not use drugs. Allergies:  Allergies  Allergen Reactions  . Doxycycline Other (See Comments)    Rash on face and neck  . Vicodin [Hydrocodone-Acetaminophen]     Hallucinations   . Flagyl [Metronidazole] Nausea And Vomiting  . Moxifloxacin Other (See Comments)     Weakness/fatigue  . Pantoprazole Sodium Rash  . Penicillins Rash    Has patient had a PCN reaction causing immediate rash, facial/tongue/throat swelling, SOB or lightheadedness with hypotension:unsure Has patient had a PCN reaction causing severe rash involving mucus membranes or skin necrosis:unsure Has patient had a PCN reaction  that required hospitalization:No Has patient had a PCN reaction occurring within the last 10 years:Yes Cannot recall exact reaction due to time lapse If all of the above answers are "NO", then may proceed with Cephalosporin use.    Medications Prior to Admission  Medication Sig Dispense Refill  . Biotin 5000 MCG TABS Take 5,000 mcg by mouth daily.    . Cholecalciferol (VITAMIN D-3) 125 MCG (5000 UT) TABS Take 5,000 Units by mouth daily.     [name] Kitchen FLUoxetine (PROZAC) 10 MG tablet Take 10 mg by mouth daily.     . folic acid (FOLVITE) 086 MCG tablet Take 1 mg by mouth 3 (three) times a week. MWF    . levothyroxine (SYNTHROID, LEVOTHROID) 50 MCG tablet Take 1 tablet (50 mcg total) by mouth daily. (Patient taking differently: Take 50 mcg by mouth daily before breakfast. ) 90 tablet 3  . methocarbamol (ROBAXIN) 500 MG tablet Take 500 mg by mouth 2 (two) times daily.    [name] Kitchen  pravastatin (PRAVACHOL) 40 MG tablet Take 40 mg by mouth at bedtime.     . SYMBICORT 80-4.5 MCG/ACT inhaler INHALE 2 PUFFS INTO THE LUNGS TWICE DAILY (Patient taking differently: Inhale 2 puffs into the lungs in the morning and at bedtime. ) 10.2 g 3  . traMADol (ULTRAM) 50 MG tablet Take 1 tablet (50 mg total) by mouth every 6 (six) hours as needed for moderate pain. 30 tablet 0  . vitamin B-12 (CYANOCOBALAMIN) 1000 MCG tablet Take 1,000 mcg by mouth 3 (three) times a week. MWF    . warfarin (COUMADIN) 2.5 MG tablet Take 1/2-1 tablet by mouth daily as directed by [facility] (Patient taking differently: Take 1.25 mg by mouth daily. ) 90 tablet 1  . zinc gluconate 50 MG tablet Take 50 mg by mouth daily.    . bisoprolol (ZEBETA) 10 MG tablet Take 1 tablet by mouth daily. Please monitor your blood pressure and heart rate daily for a couple of weeks to make sure they remain stable. (Patient not taking: Reported on 02/08/2020) 90 tablet 1  . diltiazem (CARDIZEM CD) 240 MG 24 hr capsule Take 1 capsule (240 mg total) by mouth daily. (Patient  not taking: Reported on 02/08/2020) 90 capsule 1  . docusate sodium (COLACE) 100 MG capsule Take 1 capsule (100 mg total) by mouth 2 (two) times daily. (Patient not taking: Reported on 02/08/2020) 10 capsule 0  . omeprazole (PRILOSEC) 20 MG capsule TAKE 1 CAPSULE(20 MG) BY MOUTH DAILY (Patient not taking: Reported on 02/08/2020) 90 capsule 4    Drug Regimen Review Drug regimen was reviewed and remains appropriate with no significant issues identified  Home: Home Living Family/patient expects to be discharged to:: Private residence Living Arrangements: Alone Available Help at Discharge: Family, Available 24 hours/day Type of Home: House Home Access: Stairs to enter [employer] of Steps: 2 front, 3 in back Entrance Stairs-Rails: Can reach both Home Layout: One level Bathroom Shower/Tub: Gaffer, Chiropodist: Handicapped height Bathroom Accessibility: Yes Home Equipment: Grab bars - tub/shower, Shower seat - built in, Environmental consultant - 2 wheels, Wheelchair - manual Additional Comments: has not been using walkin shower bc she can't get to it, has been using tub bench instead in regualr bathroom  Lives With: Alone (daughter had just in past week began not to stay with patien)   Functional History: Prior Function Level of Independence: Independent with assistive device(s) Comments: has had a lot of hip complications recently as well as lots of rehab at [facility] and [facility]- but was starting ot stay alone at home at night  Functional Status:  Mobility: Bed Mobility Overal bed mobility: Needs Assistance Bed Mobility: Rolling Rolling: Mod assist Sidelying to sit: Mod assist, +2 for physical assistance Supine to sit: Mod assist, +2 for physical assistance, HOB elevated General bed mobility comments: mod assist to roll, assist to get LES off bed and for elevation of trunk.  Transfers Overall transfer level: Needs assistance Equipment used: Rolling walker (2  wheeled) Transfers: Sit to/from Stand, [employer] Transfers Sit to Stand: Min assist Stand pivot transfers: Min assist General transfer comment: min assist to power up.  Cues for hand placement. Steadyng assist needed but did obtain balance once up.  Ambulation/Gait Ambulation/Gait assistance: Min assist Gait Distance (Feet): 35 Feet Assistive device: Rolling walker (2 wheeled) Gait Pattern/deviations: Step-through pattern, Decreased weight shift to left, Decreased stance time - left, Trunk flexed General Gait Details: min A for steadying with gait, decreased weightshift due  to L hip pain from prior L THA Gait velocity: slowed Gait velocity interpretation: <1.31 ft/sec, indicative of household ambulator    ADL:    Cognition: Cognition Overall Cognitive Status: Within Functional Limits for tasks assessed Orientation Level: Oriented X4 Cognition Arousal/Alertness: Awake/alert Behavior During Therapy: WFL for tasks assessed/performed, Flat affect Overall Cognitive Status: Within Functional Limits for tasks assessed General Comments: very pleasant and cooperative, lots of experience in rehab and reports she wants to work hard to get better  Physical Exam: Blood pressure (!) 146/54, pulse 69, temperature 98.1 F (36.7 C), temperature source Oral, resp. rate (!) 22, height 5\' 6"  (1.676 m), weight 71.4 kg, SpO2 98 %. Physical Exam Vitals reviewed.  Constitutional:      General: She is not in acute distress. HENT:     Head: Normocephalic and atraumatic.     Right Ear: External ear normal.     Left Ear: External ear normal.     Nose: Nose normal.  Eyes:     General:        Right eye: No discharge.        Left eye: No discharge.     Extraocular Movements: Extraocular movements intact.  Cardiovascular:     Comments: Irregularly irregular Pulmonary:     Effort: Pulmonary effort is normal. No respiratory distress.     Breath sounds: No stridor.  Abdominal:     General: There is  distension.     Tenderness: There is abdominal tenderness.  Musculoskeletal:     Cervical back: Normal range of motion and neck supple.     Comments: No edema or tenderness in extremities  Skin:    Comments: Midline incision clean dry and intact with staples.  Dressing CDI Left neck with dressing CDI  Neurological:     Mental Status: She is alert.     Comments: Patient is alert.   Oriented x3 and follows commands. Motor: Bilateral upper extremities, left lower extremity: 5/5 proximal distal Right lower extremity: Hip flexion 4+/5, knee extension 4+/5, ankle dorsiflexion 5/5  Psychiatric:        Mood and Affect: Mood normal.        Behavior: Behavior normal.     Results for orders placed or performed during the hospital encounter of 02/08/20 (from the past 48 hour(s))  Protime-INR     Status: Abnormal   Collection Time: 02/16/20  4:04 AM  Result Value Ref Range   Prothrombin Time 17.2 (H) 11.4 - 15.2 seconds   INR 1.5 (H) 0.8 - 1.2    Comment: (NOTE) INR goal varies based on device and disease states. Performed at [facility], [place] [address] [place], [place] [address]   CBC     Status: Abnormal   Collection Time: 02/16/20  4:04 AM  Result Value Ref Range   WBC 7.2 4.0 - 10.5 K/uL   RBC 3.28 (L) 3.87 - 5.11 MIL/uL   Hemoglobin 9.5 (L) 12.0 - 15.0 g/dL   HCT 30.4 (L) 36 - 46 %   MCV 92.7 80.0 - 100.0 fL   MCH 29.0 26.0 - 34.0 pg   MCHC 31.3 30.0 - 36.0 g/dL   RDW 14.6 11.5 - 15.5 %   Platelets 387 150 - 400 K/uL   nRBC 0.0 0.0 - 0.2 %    Comment: Performed at [facility], [place] [address] [place], [place] [address]  Protime-INR     Status: Abnormal   Collection Time: 02/17/20  8:33  AM  Result Value Ref Range   Prothrombin Time 16.8 (H) 11.4 - 15.2 seconds   INR 1.4 (H) 0.8 - 1.2    Comment: (NOTE) INR goal varies based on device and disease states. Performed at [facility], [place] [address] [place], [place] [address]   CBC     Status: Abnormal     Collection Time: 02/17/20  8:33 AM  Result Value Ref Range   WBC 8.5 4.0 - 10.5 K/uL   RBC 3.29 (L) 3.87 - 5.11 MIL/uL   Hemoglobin 9.6 (L) 12.0 - 15.0 g/dL   HCT 29.9 (L) 36 - 46 %   MCV 90.9 80.0 - 100.0 fL   MCH 29.2 26.0 - 34.0 pg   MCHC 32.1 30.0 - 36.0 g/dL   RDW 14.4 11.5 - 15.5 %   Platelets 399 150 - 400 K/uL   nRBC 0.0 0.0 - 0.2 %    Comment: Performed at [facility], [place] [address], [place], [place] [address]   No results found.     Medical Problem List and Plan: 1.  Sepsis with debility secondary to small bowel ischemia status post exploratory laparotomy lysis of adhesions small bowel resection gastrostomy tube placement 02/08/2020  -patient may shower  -ELOS/Goals: 10-14 days/supervision  Admit to [facility] 2.  Antithrombotics: -DVT/anticoagulation: Chronic Coumadin for history of atrial fibrillation  Monitor INR  -antiplatelet therapy: N/A 3. Pain Management: Robaxin as needed 4. Mood: Prozac 10 mg daily  -antipsychotic agents: N/A 5. Neuropsych: This patient is capable of making decisions on her own behalf. 6. Skin/Wound Care: Routine skin checks 7. Fluids/Electrolytes/Nutrition: Routine in and outs.  CMP ordered. 8.  Acute blood loss anemia.    CBC ordered for tomorrow a.m. 9.  Atrial fibrillation.  Continue Cardizem 60 mg every 6 hours.  Cardiac rate controlled.  Chronic Coumadin resumed  Monitor with increased exertion. 10.  Hypothyroidism.  Synthroid  Lavon Paganini Angiulli, PA-C 02/17/2020  I have personally performed a face to face diagnostic evaluation, including, but not limited to relevant history and physical exam findings, of this patient and developed relevant assessment and plan.  Additionally, I have reviewed and concur with the physician assistant's documentation above.  Delice Lesch, MD, ABPMR

## 2020-02-15 NOTE — Progress Notes (Signed)
[facility] for warfarin Indication: atrial fibrillation  Allergies  Allergen Reactions  . Doxycycline Other (See Comments)    Rash on face and neck  . Vicodin [Hydrocodone-Acetaminophen]     Hallucinations   . Flagyl [Metronidazole] Nausea And Vomiting  . Moxifloxacin Other (See Comments)     Weakness/fatigue  . Pantoprazole Sodium Rash  . Penicillins Rash    Has patient had a PCN reaction causing immediate rash, facial/tongue/throat swelling, SOB or lightheadedness with hypotension:unsure Has patient had a PCN reaction causing severe rash involving mucus membranes or skin necrosis:unsure Has patient had a PCN reaction that required hospitalization:No Has patient had a PCN reaction occurring within the last 10 years:Yes Cannot recall exact reaction due to time lapse If all of the above answers are "NO", then may proceed with Cephalosporin use.     Patient Measurements: Height: 5\' 6"  (167.6 cm) Weight: 76.4 kg (168 lb 6.9 oz) IBW/kg (Calculated) : 59.3  Vital Signs: Temp: 98.3 F (36.8 C) (08/16 0736) Temp Source: Oral (08/16 0736) BP: 135/58 (08/16 0736) Pulse Rate: 74 (08/16 0736)  Labs: Recent Labs    02/13/20 0340 02/14/20 0545 02/15/20 0515  HGB  --  9.3* 9.5*  HCT  --  29.8* 30.7*  PLT  --  353 392  LABPROT 16.6* 16.8* 17.3*  INR 1.4* 1.4* 1.5*  CREATININE 0.46 0.46 0.47    Estimated Creatinine Clearance: 54.6 mL/min (by C-G formula based on SCr of 0.47 mg/dL).   Medical History: Past Medical History:  Diagnosis Date  . Anemia    - Hgb 9.7gm% on 07/13/2008 in [place] -  Hgg 129gm% wiht normal irone levsl and ferritin 10/27/2008 in [place] Recurrent otitis/sinusitis  . Anxiety    chronic BZ prn  . Atrial fibrillation (HCC)    chronic anticoag  . Bronchiectasis    >PFT 07/13/2008 in [place] 1.9L/76%, FVC 2.45L/74, Ratio 79, TLC 121%, DLCO 64%  AE BRonchiectasis - Dec 2010.New Rx:  outpatient - Feb 2011 - Rx  outpatient  . CHF (congestive heart failure) ([place])   . COPD (chronic obstructive pulmonary disease) (HCC)    bronchiectasis  . Cricopharyngeal achalasia   . Depression   . Diverticulosis   . Dyslipidemia   . Eczema   . GERD with stricture   . Glaucoma   . H. pylori infection   . Hypertension   . Hyponatremia    chronic, s/p endo eval 06/2012  . Hypothyroid   . IBS (irritable bowel syndrome)   . Lumbar disc disease   . Neuropathy of both feet   . Segmental colitis ([place])   . Vertigo   . Wears glasses     Medications:  Scheduled:  . Chlorhexidine Gluconate Cloth  6 each Topical Daily  . diltiazem  60 mg Oral Q6H  . docusate sodium  100 mg Oral BID  . enoxaparin (LOVENOX) injection  40 mg Subcutaneous Q24H  . feeding supplement  1 Container Oral TID BM  . FLUoxetine  10 mg Oral QHS  . levothyroxine  25 mcg Intravenous Daily  . mometasone-formoterol  2 puff Inhalation BID  . polyethylene glycol  17 g Oral Daily  . Warfarin - Pharmacist Dosing Inpatient   Does not apply q1600   Infusions:  . sodium chloride Stopped (02/10/20 1851)  . dextrose 5 % and 0.45 % NaCl with KCl 20 mEq/L 50 mL/hr at 02/15/20 0238  . famotidine (PEPCID) IV 20 mg (02/14/20 1645)  . lactated  ringers    . magnesium sulfate bolus IVPB    . methocarbamol (ROBAXIN) IV Stopped (02/10/20 2258)   PRN: acetaminophen **OR** acetaminophen, guaiFENesin-dextromethorphan, methocarbamol (ROBAXIN) IV, morphine injection, ondansetron **OR** ondansetron (ZOFRAN) IV Anti-infectives (From admission, onward)   Start     Dose/Rate Route Frequency Ordered Stop   02/08/20 1700  meropenem (MERREM) 1 g in sodium chloride 0.9 % 100 mL IVPB  Status:  Discontinued        1 g 200 mL/hr over 30 Minutes Intravenous Every 8 hours 02/08/20 1639 02/12/20 0959   02/08/20 1530  cefTRIAXone (ROCEPHIN) 2 g in sodium chloride 0.9 % 100 mL IVPB  Status:  Discontinued        2 g 200 mL/hr over 30 Minutes Intravenous Every 24 hours  02/08/20 1502 02/08/20 1639   02/08/20 1515  cefTRIAXone (ROCEPHIN) 2 g in sodium chloride 0.9 % 100 mL IVPB  Status:  Discontinued        2 g 200 mL/hr over 30 Minutes Intravenous Every 24 hours 02/08/20 1508 02/08/20 1512   02/08/20 1500  piperacillin-tazobactam (ZOSYN) IVPB 2.25 g  Status:  Discontinued        2.25 g 100 mL/hr over 30 Minutes Intravenous  Once 02/08/20 1453 02/08/20 1502      Assessment: 86 yo female with a history of permanent atrial fibrillation presents with abdominal pain and coffee-ground emesis. The patient was found to have small bowel ischemia and underwent exploratory laparotomy, lysis of adhesions, small bowel resection and gastrostomy tube placementon 02/08/20. The patient's INR upon admit was supratherapeutic at 3.3 and the patient received KCentra 1625 units once prior to surgery.  She is on warfarin PTA for afib and pharmacy consulted to restart on 8/15. Will dose low for now and watch INR trend -INR= 1.5   Goal of Therapy:  INR 2-3 Monitor platelets by anticoagulation protocol: Yes   Plan:  Warfarin 0.5 mg PO tonight Enoxaparin 40 mg SubQ q24h Obtain daily PT/INR Monitor for signs and symptoms of bleeding  Hildred Laser, PharmD Clinical Pharmacist **Pharmacist phone directory can now be found on [email or web address] ([email or web address]).  Listed under [place].

## 2020-02-15 NOTE — Progress Notes (Signed)
[facility] Progress Note  7 Days Post-Op  Subjective: CC-  Tired but feeling ok. G tube remains clamped, bloating resolved, tolerating clears. States that she is passing a lot of flatus but still has not had a BM. States that she does have some baseline constipation at home.  Objective: Vital signs in last 24 hours: Temp:  [97.9 F (36.6 C)-98.8 F (37.1 C)] 98.3 F (36.8 C) (08/16 0736) Pulse Rate:  [73-98] 74 (08/16 0736) Resp:  [18-24] 18 (08/16 0736) BP: (131-154)/(49-67) 135/58 (08/16 0736) SpO2:  [95 %-100 %] 95 % (08/16 0736) Last BM Date: 02/09/20  Intake/Output from previous day: 08/15 0701 - 08/16 0700 In: 240 [P.O.:240] Out: 3250 [Urine:3250] Intake/Output this shift: No intake/output data recorded.  PE: Gen: Alert, NAD, pleasant HEENT: EOM's intact, pupils equal and round Card:Regular rate, irreg rhythm Pulm: rate and effort normal Abd: Soft,nondistended, +BS,midline incision cdi with staples intact and no erythemaor drainage, G tube site cdi/ tube is clamped   Lab Results:  Recent Labs    02/14/20 0545 02/15/20 0515  WBC 9.0 9.7  HGB 9.3* 9.5*  HCT 29.8* 30.7*  PLT 353 392   BMET Recent Labs    02/14/20 0545 02/15/20 0515  NA 132* 133*  K 4.4 4.0  CL 97* 97*  CO2 28 27  GLUCOSE 111* 108*  BUN <5* <5*  CREATININE 0.46 0.47  CALCIUM 8.7* 8.8*   PT/INR Recent Labs    02/14/20 0545 02/15/20 0515  LABPROT 16.8* 17.3*  INR 1.4* 1.5*   CMP     Component Value Date/Time   NA 133 (L) 02/15/2020 0515   NA 130 (L) 02/16/2019 1452   K 4.0 02/15/2020 0515   CL 97 (L) 02/15/2020 0515   CO2 27 02/15/2020 0515   GLUCOSE 108 (H) 02/15/2020 0515   BUN <5 (L) 02/15/2020 0515   BUN 13 02/16/2019 1452   CREATININE 0.47 02/15/2020 0515   CALCIUM 8.8 (L) 02/15/2020 0515   PROT 4.6 (L) 02/10/2020 0453   ALBUMIN 2.2 (L) 02/10/2020 0453   AST 15 02/10/2020 0453   ALT 9 02/10/2020 0453   ALKPHOS 58 02/10/2020 0453   BILITOT  0.4 02/10/2020 0453   GFRNONAA >60 02/15/2020 0515   GFRAA >60 02/15/2020 0515   Lipase     Component Value Date/Time   LIPASE 29.0 10/08/2011 1046       Studies/Results: No results found.  Anti-infectives: Anti-infectives (From admission, onward)   Start     Dose/Rate Route Frequency Ordered Stop   02/08/20 1700  meropenem (MERREM) 1 g in sodium chloride 0.9 % 100 mL IVPB  Status:  Discontinued        1 g 200 mL/hr over 30 Minutes Intravenous Every 8 hours 02/08/20 1639 02/12/20 0959   02/08/20 1530  cefTRIAXone (ROCEPHIN) 2 g in sodium chloride 0.9 % 100 mL IVPB  Status:  Discontinued        2 g 200 mL/hr over 30 Minutes Intravenous Every 24 hours 02/08/20 1502 02/08/20 1639   02/08/20 1515  cefTRIAXone (ROCEPHIN) 2 g in sodium chloride 0.9 % 100 mL IVPB  Status:  Discontinued        2 g 200 mL/hr over 30 Minutes Intravenous Every 24 hours 02/08/20 1508 02/08/20 1512   02/08/20 1500  piperacillin-tazobactam (ZOSYN) IVPB 2.25 g  Status:  Discontinued        2.25 g 100 mL/hr over 30 Minutes Intravenous  Once 02/08/20 1453 02/08/20 1502  Assessment/Plan A fib on coumadin- coumadin on hold, INR 1.5 Interstitial lung disease onnighttime oxygen HTN HLD Hypothyroidism CHF (EF 60-65% 10/2019) Malnutrition - prealbumin 9.3 (8/13) ABL - hgb 9.5, stable  Peritonitis, ischemic bowel, previous achalasia -S/pExploratory laparotomy, lysis of adhesions, small bowel resection, gastrostomy tube placement8/9 Dr. Georgette Dover -POD#7 - surgical path:Hemorrhagic necrosis consistent with ischemia - G tube clamped, passing flatus, no BM  ID -merrem 8/9>>8/13 FEN -IVF, clamp G, FLD, Boost VTE -SCDs, coumadin Foley -none Follow up -Dr. Georgette Dover  Plan-Advance to full liquids, add Boost. Miralax for constipation. Continue PT, mobilization. CIR following.   LOS: 7 days    [facility] 02/15/2020, 8:36 AM Please see Amion for pager number  during day hours 7:00am-4:30pm

## 2020-02-15 NOTE — Plan of Care (Signed)

## 2020-02-15 NOTE — Progress Notes (Signed)
PROGRESS NOTE    Stephanie Cortez  ZJI:[id number] DOB: 08-19-1933 DOA: 02/08/2020 PCP: [name], MD    Chief Complaint  Patient presents with  . Abdominal Pain  . Diarrhea  . Nausea    Brief Narrative:   86 year old lady with prior history of permanent atrial fibrillation on Coumadin, colitis associated with diverticulitis, IBS, achalasia, hiatal hernia, chronic back pain, hypertension, hypothyroidism, GERD, depression, anxiety, right hip fracture s/p recent total hip replacement on 11/16/2019 presents to ED with abdominal pain and coffee-ground emesis.  She was found to have small bowel ischemia secondary to bowel adhesions. General surgery consulted and she underwent Exploratory laparotomy, lysis of adhesions, small bowel resection, gastrostomy tube placement8/9 Dr. Georgette Dover. Surgical pathology shows hemorrhagic necrosis consistent with ischemia. G TUBE clamped and she was started on clears , was able to tolerate without any nausea, vomiting or abd pain. She is able to pass flatus and ambulating as tolerating. gen surgery advanced diet to full liquid today. Waiting for BM. Meanwhile cir on board to assist with disposition   Assessment & Plan:   Principal Problem:   Small bowel ischemia (HCC) Active Problems:   HYPERTENSION, BENIGN   A-fib (HCC)   Hypothyroid   GERD (gastroesophageal reflux disease)   Hyponatremia   Depression   Sepsis (HCC)   Thrombocytosis (HCC)   Abnormal CT of the abdomen  Sepsis secondary to small bowel ischemia, peritonitis.   Patient underwent exploratory laparotomy, lysis of additions, small bowel resection, gastrostomy tube placement by Dr. Georgette Dover on 02/08/2020.  Patient was started on meropenem, completed 5 days of treatment.  G tube clamping trials going on with clear liquid diet,  PT /OT evaluation recommending CIR.  Recommend getting out of bed and ambulation encouraged. Pt afebrile and wbc wnl.  Lactic acid wnl.  Pro calcitonin is 0.20  G TUBE  clamped and she was started on clears , was able to tolerate without any nausea, vomiting or abd pain. She is able to pass flatus and ambulating as tolerating. gen surgery advanced diet to full liquid today. Waiting for BM. Meanwhile CIR on board to assist with disposition      Hypokalemia, hypophosphatemia;  REPLACED.    History of recent right total hip replacement PT evaluation recommending CIR.  CIR on board.   Essential hypertension Well controlled.   Hypothyroidism Continue his levothyroxine.   Chronic atrial fibrillation Rate  Better controlled with Cardizem 60 mg every 6 hours. Change to 240 mg daily when able to tolerate soft diet.  Pt presented with supra therapeutic INR AND coumadin has been on hold for the procedure.  Repeat hemoglobin stable around 9 and she was started on Coumadin. Hemoglobin staying around 9.  Echocardiogram from 10/2019 showed  Left ventricular ejection fraction, by estimation, is 60 to 65%. The  left ventricle has normal function. The left ventricle has no regional wall motion abnormalities. Unable to assess LV diastolic filling due to underlying atrial fibrillation Patient's CHA2DS2-VASc score is 5   Depression and anxiety  Resume fluoxetine 10 mg daily.    Mild hyponatremia;  Much improved.    Chronic diastolic heart failure She appears to be compensated.  Echocardiogram from 4/21 reviewed.  Not on diuretics at this time.    Lactic acidosis on 02/08/2020 Resolved.   Anemia of blood loss from the procedure/ post op anemia and anemia of acute illness:  Baseline hemoglobin around 11. It dropped to 9 on 8/13, recheck hemoglobin is stable around 9. Transfuse to keep  hemoglobin greater than 7.    DVT prophylaxis: (Lovenox) Code Status: (Full code.  Family Communication: family at bedside.  Disposition:   Status is: Inpatient  Remains inpatient appropriate because: Inpatient level of care appropriate due to severity of illness, pt  still on full liquid diet , waiting for the bowel function to resume. Will need clearance from surgery    Dispo:  Patient From: Home  Planned Disposition: CIR  Expected discharge date: 02/15/20  Medically stable for discharge: No        Consultants:   Surgery.    Procedures: S/p exploratory laparotomy, lysis of additions, small bowel resection, gastrostomy tube placement by Dr. To ED on 02/08/2020.   Antimicrobials:  Anti-infectives (From admission, onward)   Start     Dose/Rate Route Frequency Ordered Stop   02/08/20 1700  meropenem (MERREM) 1 g in sodium chloride 0.9 % 100 mL IVPB  Status:  Discontinued        1 g 200 mL/hr over 30 Minutes Intravenous Every 8 hours 02/08/20 1639 02/12/20 0959   02/08/20 1530  cefTRIAXone (ROCEPHIN) 2 g in sodium chloride 0.9 % 100 mL IVPB  Status:  Discontinued        2 g 200 mL/hr over 30 Minutes Intravenous Every 24 hours 02/08/20 1502 02/08/20 1639   02/08/20 1515  cefTRIAXone (ROCEPHIN) 2 g in sodium chloride 0.9 % 100 mL IVPB  Status:  Discontinued        2 g 200 mL/hr over 30 Minutes Intravenous Every 24 hours 02/08/20 1508 02/08/20 1512   02/08/20 1500  piperacillin-tazobactam (ZOSYN) IVPB 2.25 g  Status:  Discontinued        2.25 g 100 mL/hr over 30 Minutes Intravenous  Once 02/08/20 1453 02/08/20 1502     .    Subjective: No chest pain or sob. No nausea, or vomiting or ab pain. Able to tolerate full liquid diet.   Objective: Vitals:   02/15/20 0522 02/15/20 0736 02/15/20 0917 02/15/20 1112  BP: 140/67 (!) 135/58  (!) 120/58  Pulse: 86 74  83  Resp: (!) 21 18  20   Temp: 98.1 F (36.7 C) 98.3 F (36.8 C)  98.4 F (36.9 C)  TempSrc: Oral Oral  Oral  SpO2: 96% 95% 96% 94%  Weight:      Height:        Intake/Output Summary (Last 24 hours) at 02/15/2020 1329 Last data filed at 02/15/2020 1133 Gross per 24 hour  Intake 240 ml  Output 2900 ml  Net -2660 ml   Filed Weights   02/13/20 0500 02/14/20 0445  Weight: 79.3  kg 76.4 kg    Examination:  General exam: Alert and comfortable not in any kind of distress Respiratory system: Bilateral air entry fair, no wheezing or rhonchi Cardiovascular system: S1-S2 heard, irregularly irregular, no JVD, no pedal edema Gastrointestinal system: Abdomen is soft, mildly distended, no tenderness, G-tube in place and clamped Central nervous system: Alert and oriented, grossly nonfocal Extremities: No cyanosis or clubbing Skin: No rashes seen Psychiatry: Mood is appropriate    Data Reviewed: I have personally reviewed following labs and imaging studies  CBC: Recent Labs  Lab 02/09/20 0351 02/10/20 0453 02/12/20 0400 02/14/20 0545 02/15/20 0515  WBC 16.5* 10.1 7.9 9.0 9.7  NEUTROABS  --  8.1*  --   --   --   HGB 10.9* 11.6* 9.1* 9.3* 9.5*  HCT 34.6* 37.2 28.9* 29.8* 30.7*  MCV 91.1 92.8 93.5 92.3 91.1  PLT  322 229 310 353 578    Basic Metabolic Panel: Recent Labs  Lab 02/09/20 0351 02/09/20 0351 02/10/20 0453 02/10/20 0453 02/11/20 0527 02/11/20 1103 02/12/20 0400 02/13/20 0340 02/14/20 0545 02/15/20 0515  NA 134*   < > 135   < > 134*  --  132* 132* 132* 133*  K 3.9   < > 3.7   < > 3.8  --  4.3 4.7 4.4 4.0  CL 100   < > 101   < > 101  --  99 98 97* 97*  CO2 28   < > 28   < > 27  --  28 28 28 27   GLUCOSE 131*   < > 134*   < > 110*  --  115* 114* 111* 108*  BUN 9   < > 9   < > <5*  --  <5* <5* <5* <5*  CREATININE 0.67   < > 0.53   < > 0.45  --  0.48 0.46 0.46 0.47  CALCIUM 8.3*   < > 8.0*   < > 8.1*  --  8.2* 8.4* 8.7* 8.8*  MG 1.6*  --  2.3  --   --   --  1.7  --   --   --   PHOS 2.6  --  1.1*  --   --  1.7* 3.3  --   --   --    < > = values in this interval not displayed.    GFR: Estimated Creatinine Clearance: 54.6 mL/min (by C-G formula based on SCr of 0.47 mg/dL).  Liver Function Tests: Recent Labs  Lab 02/09/20 0351 02/10/20 0453  AST 15 15  ALT 9 9  ALKPHOS 59 58  BILITOT 0.5 0.4  PROT 4.8* 4.6*  ALBUMIN 2.5* 2.2*     CBG: No results for input(s): GLUCAP in the last 168 hours.   Recent Results (from the past 240 hour(s))  SARS Coronavirus 2 by RT PCR (hospital order, performed in [facility] hospital lab) Nasopharyngeal Nasopharyngeal Swab     Status: None   Collection Time: 02/08/20  1:26 PM   Specimen: Nasopharyngeal Swab  Result Value Ref Range Status   SARS Coronavirus 2 NEGATIVE NEGATIVE Final    Comment: (NOTE) SARS-CoV-2 target nucleic acids are NOT DETECTED.  The SARS-CoV-2 RNA is generally detectable in upper and lower respiratory specimens during the acute phase of infection. The lowest concentration of SARS-CoV-2 viral copies this assay can detect is 250 copies / mL. A negative result does not preclude SARS-CoV-2 infection and should not be used as the sole basis for treatment or other patient management decisions.  A negative result may occur with improper specimen collection / handling, submission of specimen other than nasopharyngeal swab, presence of viral mutation(s) within the areas targeted by this assay, and inadequate number of viral copies (<250 copies / mL). A negative result must be combined with clinical observations, patient history, and epidemiological information.  Fact Sheet for Patients:   StrictlyIdeas.no  Fact Sheet for Healthcare Providers: BankingDealers.co.za  This test is not yet approved or  cleared by the [place] FDA and has been authorized for detection and/or diagnosis of SARS-CoV-2 by FDA under an Emergency Use Authorization (EUA).  This EUA will remain in effect (meaning this test can be used) for the duration of the COVID-19 declaration under Section 564(b)(1) of the Act, 21 U.S.C. section 360bbb-3(b)(1), unless the authorization is terminated or revoked sooner.  Performed at [facility]  [facility], [place] [address], [place], [place] [address]          Radiology Studies: No results  found.      Scheduled Meds: . Chlorhexidine Gluconate Cloth  6 each Topical Daily  . diltiazem  60 mg Oral Q6H  . docusate sodium  100 mg Oral BID  . enoxaparin (LOVENOX) injection  40 mg Subcutaneous Q24H  . feeding supplement  1 Container Oral TID BM  . FLUoxetine  10 mg Oral QHS  . [START ON 02/16/2020] levothyroxine  50 mcg Oral Q0600  . mometasone-formoterol  2 puff Inhalation BID  . polyethylene glycol  17 g Oral Daily  . warfarin  0.5 mg Oral ONCE-1600  . Warfarin - Pharmacist Dosing Inpatient   Does not apply q1600   Continuous Infusions: . sodium chloride Stopped (02/10/20 1851)  . dextrose 5 % and 0.45 % NaCl with KCl 20 mEq/L 50 mL/hr at 02/15/20 0238  . famotidine (PEPCID) IV 20 mg (02/14/20 1645)  . lactated ringers    . methocarbamol (ROBAXIN) IV Stopped (02/10/20 2258)     LOS: 7 days        Hosie Poisson, MD [facility]   To contact the attending provider between 7A-7P or the covering provider during after hours 7P-7A, please log into the web site [email or web address] and access using universal [place] password for that web site. If you do not have the password, please call the hospital operator.  02/15/2020, 1:29 PM

## 2020-02-15 NOTE — PMR Pre-admission (Signed)
PMR Admission Coordinator Pre-Admission Assessment  Patient: Stephanie Cortez is an 86 y.o., female MRN: [id number] DOB: 07-12-1933 Height: 5' 6"  (167.6 cm) Weight: 71.4 kg  Insurance Information HMO:     PPO:      PCP:      IPA:      80/20:      OTHER:  PRIMARY: Medicare a and b      Policy#: [id number]      Subscriber: pt Benefits:  Phone #: passport one online     Name: 8/12 Eff. Date: a 07/02/2000 and b 07/02/2009     Deduct: $1484      Out of Pocket Max: none      Life Max: none CIR: 100%      SNF: 20 full days Outpatient: 80%     Co-Pay: 20% Home Health: 1005      Co-Pay: none DME: 80%     Co-Pay: 20% Providers: pt choice  SECONDARY: [place] [facility] /state of [place]      Policy#: [id number]  Financial Counselor:       Phone#:   The "Data Collection Information Summary" for patients in Inpatient Rehabilitation Facilities with attached "Privacy Act [place] Records" was provided and verbally reviewed with: Patient and Family  Emergency Contact Information Contact Information    Name Relation Home Work Mobile   [place] Daughter [phone]  [phone]   [name] [phone]     Sakura, Denis Spouse [phone]        Current Medical History  Patient Admitting Diagnosis: Debility  History of Present Illness: . Hossain is an 86 year old right-handed female with history of atrial fibrillation maintained on Coumadin, interstitial lung disease maintained on oxygen, anxiety, diastolic congestive heart failure, COPD, hypertension, chronic back pain, history of right hip fracture status post right hip hemiarthroplasty and received inpatient rehab services 10/30/2019 to 11/16/2019 , hyperlipidemia, colitis associate with diverticulitis/IBS.   Presented 02/08/2020 with abdominal pain, coffee-ground emesis as well as nausea and vomiting.  CT angiogram showed what appeared to be small bowel ischemia in the mid lower abdomen likely secondary to torsion of the  mesentery.  She was given Kcentra to reverse INR of 3.3.  Admission chemistry sodium 132, glucose 175, WBC 18,500, hemoglobin 13.9, lactic acid 2.4.  Patient underwent exploratory laparotomy lysis of adhesions small bowel resection gastrostomy tube placement 02/08/2020 per Dr.Tsuei.  Surgical pathology hemorrhagic necrosis consistent with ischemia.  Working with clamping of G-tube and diet slowly advanced.  Chronic Coumadin has been resumed.  Acute blood loss anemia 9.6 and latest INR of 1.4.     Patient's medical record from [facility] has been reviewed by the rehabilitation admission coordinator and physician.  Past Medical History  Past Medical History:  Diagnosis Date  . Anemia    - Hgb 9.7gm% on 07/13/2008 in [place] -  Hgg 129gm% wiht normal irone levsl and ferritin 10/27/2008 in [place] Recurrent otitis/sinusitis  . Anxiety    chronic BZ prn  . Atrial fibrillation (HCC)    chronic anticoag  . Bronchiectasis    >PFT 07/13/2008 in [place] 1.9L/76%, FVC 2.45L/74, Ratio 79, TLC 121%, DLCO 64%  AE BRonchiectasis - Dec 2010.New Rx:  outpatient - Feb 2011 - Rx outpatient  . CHF (congestive heart failure) ([place])   . COPD (chronic obstructive pulmonary disease) (HCC)    bronchiectasis  . Cricopharyngeal achalasia   . Depression   . Diverticulosis   . Dyslipidemia   . Eczema   .  GERD with stricture   . Glaucoma   . H. pylori infection   . Hypertension   . Hyponatremia    chronic, s/p endo eval 06/2012  . Hypothyroid   . IBS (irritable bowel syndrome)   . Lumbar disc disease   . Neuropathy of both feet   . Segmental colitis ([place])   . Vertigo   . Wears glasses     Family History   family history includes Atrial fibrillation in an other family member; Cancer in her father; Colon polyps in her sister; Emphysema in her brother; Hypertension in her mother; Kidney disease in her sister; Other in her father; Pancreatic cancer in her sister; Stroke in her mother.  Prior  Rehab/Hospitalizations Has the patient had prior rehab or hospitalizations prior to admission? Yes CIR 10/2019 then returned to acute with complications. Did not choose to return to CIR. Went to [facility] and then returned home with daughter providing 24/7 supervision until a few days prior to admit.  Has the patient had major surgery during 100 days prior to admission? Yes   Current Medications  Current Facility-Administered Medications:  .  0.9 %  sodium chloride infusion, , Intravenous, Continuous, Kayleen Memos, DO, Stopped at 02/10/20 1851 .  acetaminophen (TYLENOL) tablet 650 mg, 650 mg, Oral, Q6H PRN, 650 mg at 02/17/20 2694 **OR** acetaminophen (TYLENOL) suppository 650 mg, 650 mg, Rectal, Q6H PRN, Donnie Mesa, MD .  Chlorhexidine Gluconate Cloth 2 % PADS 6 each, 6 each, Topical, Daily, Darliss Cheney, MD, 6 each at 02/15/20 1000 .  diltiazem (CARDIZEM) tablet 60 mg, 60 mg, Oral, Q6H, Hosie Poisson, MD, 60 mg at 02/17/20 0536 .  diphenhydrAMINE (BENADRYL) capsule 25 mg, 25 mg, Oral, QHS PRN, Shalhoub, Sherryll Burger, MD, 25 mg at 02/17/20 0211 .  docusate sodium (COLACE) capsule 100 mg, 100 mg, Oral, BID, Meuth, Brooke A, PA-C, 100 mg at 02/17/20 1020 .  enoxaparin (LOVENOX) injection 40 mg, 40 mg, Subcutaneous, Q24H, Hall, Carole N, DO, 40 mg at 02/16/20 1753 .  famotidine (PEPCID) IVPB 20 mg premix, 20 mg, Intravenous, Q24H, Donnie Mesa, MD, Last Rate: 100 mL/hr at 02/16/20 1800, 20 mg at 02/16/20 1800 .  feeding supplement (BOOST / RESOURCE BREEZE) liquid 1 Container, 1 Container, Oral, TID BM, Meuth, Brooke A, PA-C, 1 Container at 02/17/20 1020 .  FLUoxetine (PROZAC) capsule 10 mg, 10 mg, Oral, QHS, Akula, Vijaya, MD, 10 mg at 02/16/20 2213 .  guaiFENesin-dextromethorphan (ROBITUSSIN DM) 100-10 MG/5ML syrup 5 mL, 5 mL, Oral, Q4H PRN, Hosie Poisson, MD, 5 mL at 02/14/20 2204 .  lactated ringers infusion, , Intravenous, Continuous, Donnie Mesa, MD .  levothyroxine (SYNTHROID) tablet  50 mcg, 50 mcg, Oral, Q0600, Kris Mouton, RPH, 50 mcg at 02/17/20 0536 .  melatonin tablet 10 mg, 10 mg, Oral, QHS PRN, Shalhoub, Sherryll Burger, MD, 10 mg at 02/16/20 2329 .  methocarbamol (ROBAXIN) 500 mg in dextrose 5 % 50 mL IVPB, 500 mg, Intravenous, Q8H PRN, Rolm Bookbinder, MD, Stopped at 02/10/20 2258 .  mometasone-formoterol (DULERA) 100-5 MCG/ACT inhaler 2 puff, 2 puff, Inhalation, BID, Donnie Mesa, MD, 2 puff at 02/17/20 [phone] .  morphine 2 MG/ML injection 2-4 mg, 2-4 mg, Intravenous, Q4H PRN, Shela Leff, MD, 2 mg at 02/14/20 0157 .  ondansetron (ZOFRAN) tablet 4 mg, 4 mg, Oral, Q6H PRN **OR** ondansetron (ZOFRAN) injection 4 mg, 4 mg, Intravenous, Q6H PRN, Donnie Mesa, MD .  polyethylene glycol (MIRALAX / GLYCOLAX) packet 17 g, 17 g, Oral, Daily,  Meuth, Brooke A, PA-C, 17 g at 02/17/20 1020 .  sodium chloride flush (NS) 0.9 % injection 10-40 mL, 10-40 mL, Intracatheter, Q12H, Hosie Poisson, MD, 10 mL at 02/17/20 1021 .  sodium chloride flush (NS) 0.9 % injection 10-40 mL, 10-40 mL, Intracatheter, PRN, Hosie Poisson, MD .  warfarin (COUMADIN) tablet 1.25 mg, 1.25 mg, Oral, ONCE-1600, Hosie Poisson, MD .  Warfarin - Pharmacist Dosing Inpatient, , Does not apply, q1600, Darlina Sicilian, [facility], Given at 02/14/20 1646  Patients Current Diet:  Diet Order            Diet - low sodium heart healthy           DIET SOFT Room service appropriate? Yes; Fluid consistency: Thin  Diet effective now                 Precautions / Restrictions Precautions Precautions: Fall Precaution Comments: gastric drainage bag/abdominal incisions, recent hx of R posterior THA WBAT, posterior lean Restrictions Weight Bearing Restrictions: No   Has the patient had 2 or more falls or a fall with injury in the past year? Yes  Prior Activity Level Limited Community (1-2x/wk): Mod I with RW  Prior Functional Level Self Care: Did the patient need help bathing, dressing, using the toilet or  eating? Needed some help  Indoor Mobility: Did the patient need assistance with walking from room to room (with or without device)? Independent  Stairs: Did the patient need assistance with internal or external stairs (with or without device)? Needed some help  Functional Cognition: Did the patient need help planning regular tasks such as shopping or remembering to take medications? [place] / Equipment Home Assistive Devices/Equipment: Environmental consultant (specify type), Wheelchair Home Equipment: Grab bars - tub/shower, Shower seat - built in, Environmental consultant - 2 wheels, Wheelchair - manual  Prior Device Use: Indicate devices/aids used by the patient prior to current illness, exacerbation or injury? Walker  Current Functional Level Cognition  Overall Cognitive Status: Within Functional Limits for tasks assessed Orientation Level: Oriented X4 General Comments: very pleasant and cooperative, lots of experience in rehab and reports she wants to work hard to get better    Extremity Assessment (includes Sensation/Coordination)  Upper Extremity Assessment: Defer to OT evaluation  Lower Extremity Assessment: Generalized weakness    ADLs       Mobility  Overal bed mobility: Needs Assistance Bed Mobility: Rolling Rolling: Mod assist Sidelying to sit: Mod assist, +2 for physical assistance Supine to sit: Mod assist, +2 for physical assistance, HOB elevated General bed mobility comments: mod assist to roll, assist to get LES off bed and for elevation of trunk.     Transfers  Overall transfer level: Needs assistance Equipment used: Rolling walker (2 wheeled) Transfers: Sit to/from Stand, [employer] Transfers Sit to Stand: Min assist Stand pivot transfers: Min assist General transfer comment: min assist to power up.  Cues for hand placement. Steadyng assist needed but did obtain balance once up.     Ambulation / Gait / Stairs / Wheelchair Mobility   Ambulation/Gait Ambulation/Gait assistance: Herbalist (Feet): 35 Feet Assistive device: Rolling walker (2 wheeled) Gait Pattern/deviations: Step-through pattern, Decreased weight shift to left, Decreased stance time - left, Trunk flexed General Gait Details: min A for steadying with gait, decreased weightshift due to L hip pain from prior L THA Gait velocity: slowed Gait velocity interpretation: <1.31 ft/sec, indicative of household ambulator    Posture / Balance Dynamic Sitting Balance Sitting  balance - Comments: initial posterior lean with min A for steadying able to progress to independent   Balance Overall balance assessment: Needs assistance Sitting-balance support: Feet supported, Bilateral upper extremity supported Sitting balance-Leahy Scale: Fair Sitting balance - Comments: initial posterior lean with min A for steadying able to progress to independent   Postural control: Posterior lean Standing balance support: Bilateral upper extremity supported, During functional activity Standing balance-Leahy Scale: Poor Standing balance comment: requires B UE support on UEs and external support for balance    Special needs/care consideration Designated visitor on admit, [name] 18 Fr G tube LUQ Abdominal incision   Previous Home Environment  Living Arrangements: Alone  Lives With: Alone (daughter had just in past week began not to stay with patien) Available Help at Discharge: Family, Available 24 hours/day Type of Home: House Home Layout: One level Home Access: Stairs to enter Entrance Stairs-Rails: Can reach both Entrance Stairs-Number of Steps: 2 front, 3 in back Bathroom Shower/Tub: Walk-in shower, Chiropodist: Handicapped height Bathroom Accessibility: Yes How Accessible: Accessible via walker Home Care Services: No Additional Comments: has not been using walkin shower bc she can't get to it, has been using tub bench instead in regualr  bathroom  Discharge Living Setting Plans for Discharge Living Setting: Patient's home, Alone Type of Home at Discharge: House Discharge Home Layout: One level Discharge Home Access: Stairs to enter Entrance Stairs-Rails: Right, Left, Can reach both Entrance Stairs-Number of Steps: 2 in front, 3 in back Discharge Bathroom Shower/Tub: Tub/shower unit, Walk-in shower Discharge Bathroom Toilet: Handicapped height Discharge Bathroom Accessibility: Yes How Accessible: Accessible via walker Does the patient have any problems obtaining your medications?: No  Social/Family/Support Systems Patient Roles: Parent Contact Information: daughter, [name] Anticipated Caregiver: daughter and family Anticipated Caregiver's Contact Information: see above Ability/Limitations of Caregiver: no limitations Caregiver Availability: 24/7 Discharge Plan Discussed with Primary Caregiver: Yes Is Caregiver In Agreement with Plan?: Yes Does Caregiver/Family have Issues with Lodging/Transportation while Pt is in Rehab?: No  Goals Patient/Family Goal for Rehab: Mod I to superivsion with PT and OT Expected length of stay: ELOS 10 to 12 days Additional Information: Recent CIR admit, returned to acute and then went to [facility] Pt/Family Agrees to Admission and willing to participate: Yes Program Orientation Provided & Reviewed with Pt/Caregiver Including Roles  & Responsibilities: Yes  Decrease burden of Care through IP rehab admission: n/a  Possible need for SNF placement upon discharge: not anticipated  Patient Condition: I have reviewed medical records from [facility], spoken with CM, and patient, daughter and family member. I met with patient at the bedside for inpatient rehabilitation assessment.  Patient will benefit from ongoing PT and OT, can actively participate in 3 hours of therapy a day 5 days of the week, and can make measurable gains during the admission.  Patient will also benefit from the  coordinated team approach during an Inpatient Acute Rehabilitation admission.  The patient will receive intensive therapy as well as Rehabilitation physician, nursing, social worker, and care management interventions.  Due to bladder management, bowel management, safety, skin/wound care, disease management, medication administration, pain management and patient education the patient requires 24 hour a day rehabilitation nursing.  The patient is currently min to mod assist overall with mobility and basic ADLs.  Discharge setting and therapy post discharge at home with home health is anticipated.  Patient has agreed to participate in the Acute Inpatient Rehabilitation Program and will admit today.  Preadmission Screen Completed By:  Katalina, Magri, 02/17/2020 11:35 AM ______________________________________________________________________   Discussed status with Dr. Posey Pronto  on  02/17/2020  at 1138 and received approval for admission today.  Admission Coordinator:  Tinna, Kolker, RN, time  2703 Date  02/17/2020   Assessment/Plan: Diagnosis: Debility  1. Does the need for close, 24 hr/day Medical supervision in concert with the patient's rehab needs make it unreasonable for this patient to be served in a less intensive setting? Yes  2. Co-Morbidities requiring supervision/potential complications: atrial fibrillation maintained on Coumadin, interstitial lung disease maintained on oxygen, anxiety, diastolic congestive heart failure, COPD, hypertension, chronic back pain, history of right hip fracture status post right hip hemiarthroplasty and received inpatient rehab services 10/30/2019 to 11/16/2019 , hyperlipidemia, colitis associate with diverticulitis/IBS. 3. Due to bowel management, safety, skin/wound care, disease management, pain management and patient education, does the patient require 24 hr/day rehab nursing? Yes 4. Does the patient require coordinated care of a physician, rehab nurse,  PT, OT to address physical and functional deficits in the context of the above medical diagnosis(es)? Yes Addressing deficits in the following areas: balance, endurance, locomotion, strength, transferring, bathing, dressing, toileting and psychosocial support 5. Can the patient actively participate in an intensive therapy program of at least 3 hrs of therapy 5 days a week? Yes 6. The potential for patient to make measurable gains while on inpatient rehab is excellent 7. Anticipated functional outcomes upon discharge from inpatient rehab: modified independent and supervision PT, modified independent and supervision OT, n/a SLP 8. Estimated rehab length of stay to reach the above functional goals is: 10-14 days. 9. Anticipated discharge destination: Home 10. Overall Rehab/Functional Prognosis: excellent   MD Signature: Delice Lesch, MD, ABPMR

## 2020-02-15 NOTE — Progress Notes (Signed)
Inpatient Rehabilitation Admissions Coordinator  I met at bedside with patient and her daughter. She is eating grits and pudding today. I await medical readiness to admit to CIR. Patient and daughter are in agreement to that plan.  Danne Baxter, RN, MSN Rehab Admissions Coordinator [phone] 02/15/2020 11:48 AM

## 2020-02-16 DIAGNOSIS — I4821 Permanent atrial fibrillation: Secondary | ICD-10-CM | POA: Diagnosis not present

## 2020-02-16 DIAGNOSIS — K219 Gastro-esophageal reflux disease without esophagitis: Secondary | ICD-10-CM | POA: Diagnosis not present

## 2020-02-16 DIAGNOSIS — I10 Essential (primary) hypertension: Secondary | ICD-10-CM | POA: Diagnosis not present

## 2020-02-16 DIAGNOSIS — K559 Vascular disorder of intestine, unspecified: Secondary | ICD-10-CM | POA: Diagnosis not present

## 2020-02-16 LAB — CBC
HCT: 30.4 % — ABNORMAL LOW (ref 36.0–46.0)
Hemoglobin: 9.5 g/dL — ABNORMAL LOW (ref 12.0–15.0)
MCH: 29 pg (ref 26.0–34.0)
MCHC: 31.3 g/dL (ref 30.0–36.0)
MCV: 92.7 fL (ref 80.0–100.0)
Platelets: 387 10*3/uL (ref 150–400)
RBC: 3.28 MIL/uL — ABNORMAL LOW (ref 3.87–5.11)
RDW: 14.6 % (ref 11.5–15.5)
WBC: 7.2 10*3/uL (ref 4.0–10.5)
nRBC: 0 % (ref 0.0–0.2)

## 2020-02-16 LAB — PROTIME-INR
INR: 1.5 — ABNORMAL HIGH (ref 0.8–1.2)
Prothrombin Time: 17.2 seconds — ABNORMAL HIGH (ref 11.4–15.2)

## 2020-02-16 MED ORDER — MELATONIN 5 MG PO TABS
10.0000 mg | ORAL_TABLET | Freq: Every evening | ORAL | Status: DC | PRN
  Administered 2020-02-16: 10 mg via ORAL
  Filled 2020-02-16: qty 2

## 2020-02-16 MED ORDER — SODIUM CHLORIDE 0.9% FLUSH: 10.0000 mL | INTRAVENOUS | Status: DC | PRN

## 2020-02-16 MED ORDER — SODIUM CHLORIDE 0.9% FLUSH
10.0000 mL | Freq: Two times a day (BID) | INTRAVENOUS | Status: DC
  Administered 2020-02-17: 10 mL

## 2020-02-16 MED ORDER — BISACODYL 10 MG RE SUPP
10.0000 mg | Freq: Once | RECTAL | Status: AC
  Administered 2020-02-16: 10 mg via RECTAL
  Filled 2020-02-16: qty 1

## 2020-02-16 MED ORDER — WARFARIN 1.25 MG HALF TABLET
1.2500 mg | ORAL_TABLET | Freq: Once | ORAL | Status: AC
  Administered 2020-02-16: 1.25 mg via ORAL
  Filled 2020-02-16: qty 1

## 2020-02-16 NOTE — Progress Notes (Signed)
8 Days Post-Op  Subjective: Patient states she is doing well.  + flatus, no nausea.  No BM yet.  Tolerating fulls well.  Waiting to go to CIR  ROS: See above, otherwise other systems negative  Objective: Vital signs in last 24 hours: Temp:  [97.1 F (36.2 C)-98.6 F (37 C)] 97.8 F (36.6 C) (08/17 0835) Pulse Rate:  [59-83] 79 (08/17 0835) Resp:  [20-22] 22 (08/17 0835) BP: (95-139)/(45-64) 135/57 (08/17 0835) SpO2:  [94 %-98 %] 98 % (08/17 0848) Weight:  [60.9 kg] 60.9 kg (08/17 0500) Last BM Date: 02/09/20  Intake/Output from previous day: 08/16 0701 - 08/17 0700 In: 1365 [P.O.:240; I.V.:975; IV Piggyback:150] Out: 950 [Urine:950] Intake/Output this shift: No intake/output data recorded.  PE: Abd: soft, appropriately tender, midline incision is c/d/i with staples, g-tube in place and clamped off, +BS  Lab Results:  Recent Labs    02/15/20 0515 02/16/20 0404  WBC 9.7 7.2  HGB 9.5* 9.5*  HCT 30.7* 30.4*  PLT 392 387   BMET Recent Labs    02/14/20 0545 02/15/20 0515  NA 132* 133*  K 4.4 4.0  CL 97* 97*  CO2 28 27  GLUCOSE 111* 108*  BUN <5* <5*  CREATININE 0.46 0.47  CALCIUM 8.7* 8.8*   PT/INR Recent Labs    02/15/20 0515 02/16/20 0404  LABPROT 17.3* 17.2*  INR 1.5* 1.5*   CMP     Component Value Date/Time   NA 133 (L) 02/15/2020 0515   NA 130 (L) 02/16/2019 1452   K 4.0 02/15/2020 0515   CL 97 (L) 02/15/2020 0515   CO2 27 02/15/2020 0515   GLUCOSE 108 (H) 02/15/2020 0515   BUN <5 (L) 02/15/2020 0515   BUN 13 02/16/2019 1452   CREATININE 0.47 02/15/2020 0515   CALCIUM 8.8 (L) 02/15/2020 0515   PROT 4.6 (L) 02/10/2020 0453   ALBUMIN 2.2 (L) 02/10/2020 0453   AST 15 02/10/2020 0453   ALT 9 02/10/2020 0453   ALKPHOS 58 02/10/2020 0453   BILITOT 0.4 02/10/2020 0453   GFRNONAA >60 02/15/2020 0515   GFRAA >60 02/15/2020 0515   Lipase     Component Value Date/Time   LIPASE 29.0 10/08/2011 1046       Studies/Results: No results  found.  Anti-infectives: Anti-infectives (From admission, onward)   Start     Dose/Rate Route Frequency Ordered Stop   02/08/20 1700  meropenem (MERREM) 1 g in sodium chloride 0.9 % 100 mL IVPB  Status:  Discontinued        1 g 200 mL/hr over 30 Minutes Intravenous Every 8 hours 02/08/20 1639 02/12/20 0959   02/08/20 1530  cefTRIAXone (ROCEPHIN) 2 g in sodium chloride 0.9 % 100 mL IVPB  Status:  Discontinued        2 g 200 mL/hr over 30 Minutes Intravenous Every 24 hours 02/08/20 1502 02/08/20 1639   02/08/20 1515  cefTRIAXone (ROCEPHIN) 2 g in sodium chloride 0.9 % 100 mL IVPB  Status:  Discontinued        2 g 200 mL/hr over 30 Minutes Intravenous Every 24 hours 02/08/20 1508 02/08/20 1512   02/08/20 1500  piperacillin-tazobactam (ZOSYN) IVPB 2.25 g  Status:  Discontinued        2.25 g 100 mL/hr over 30 Minutes Intravenous  Once 02/08/20 1453 02/08/20 1502       Assessment/Plan A fib on coumadin- coumadin on hold, INR 1.5 Interstitial lung disease onnighttime oxygen HTN HLD Hypothyroidism CHF (  EF 60-65% 10/2019) Malnutrition - prealbumin 9.3 (8/13) ABL - hgb 9.5, stable  Peritonitis, ischemic bowel, previous achalasia -S/pExploratory laparotomy, lysis of adhesions, small bowel resection, gastrostomy tube placement8/9 Dr. Georgette Dover -POD#8 - surgical path:Hemorrhagic necrosis consistent with ischemia - G tube clamped, passing flatus, no BM, will give suppository today -adv to soft diet -surgically stable for CIR when bed available -follow up arranged with Dr. Georgette Dover in 3 weeks -will need staples removed in CIR on Friday 8/20  ID -merrem 8/9>>8/13 FEN -IVF, clamp G, soft diet, Boost VTE -SCDs, coumadin Foley -none Follow up -Dr. Georgette Dover   LOS: 8 days    Henreitta Cea , [facility] 02/16/2020, 9:12 AM Please see Amion for pager number during day hours 7:00am-4:30pm or 7:00am -11:30am on weekends

## 2020-02-16 NOTE — Progress Notes (Signed)
[place] for warfarin Indication: atrial fibrillation  Allergies  Allergen Reactions  . Doxycycline Other (See Comments)    Rash on face and neck  . Vicodin [Hydrocodone-Acetaminophen]     Hallucinations   . Flagyl [Metronidazole] Nausea And Vomiting  . Moxifloxacin Other (See Comments)     Weakness/fatigue  . Pantoprazole Sodium Rash  . Penicillins Rash    Has patient had a PCN reaction causing immediate rash, facial/tongue/throat swelling, SOB or lightheadedness with hypotension:unsure Has patient had a PCN reaction causing severe rash involving mucus membranes or skin necrosis:unsure Has patient had a PCN reaction that required hospitalization:No Has patient had a PCN reaction occurring within the last 10 years:Yes Cannot recall exact reaction due to time lapse If all of the above answers are "NO", then may proceed with Cephalosporin use.     Patient Measurements: Height: 5\' 6"  (167.6 cm) Weight: 60.9 kg (134 lb 3.2 oz) IBW/kg (Calculated) : 59.3  Vital Signs: Temp: 97.8 F (36.6 C) (08/17 0835) Temp Source: Oral (08/17 0835) BP: 135/57 (08/17 0835) Pulse Rate: 79 (08/17 0835)  Labs: Recent Labs    02/14/20 0545 02/14/20 0545 02/15/20 0515 02/16/20 0404  HGB 9.3*   < > 9.5* 9.5*  HCT 29.8*  --  30.7* 30.4*  PLT 353  --  392 387  LABPROT 16.8*  --  17.3* 17.2*  INR 1.4*  --  1.5* 1.5*  CREATININE 0.46  --  0.47  --    < > = values in this interval not displayed.    Estimated Creatinine Clearance: 49 mL/min (by C-G formula based on SCr of 0.47 mg/dL).   Medical History: Past Medical History:  Diagnosis Date  . Anemia    - Hgb 9.7gm% on 07/13/2008 in [place] -  Hgg 129gm% wiht normal irone levsl and ferritin 10/27/2008 in [place] Recurrent otitis/sinusitis  . Anxiety    chronic BZ prn  . Atrial fibrillation (HCC)    chronic anticoag  . Bronchiectasis    >PFT 07/13/2008 in [place] 1.9L/76%, FVC 2.45L/74, Ratio 79,  TLC 121%, DLCO 64%  AE BRonchiectasis - Dec 2010.New Rx:  outpatient - Feb 2011 - Rx outpatient  . CHF (congestive heart failure) ([place])   . COPD (chronic obstructive pulmonary disease) (HCC)    bronchiectasis  . Cricopharyngeal achalasia   . Depression   . Diverticulosis   . Dyslipidemia   . Eczema   . GERD with stricture   . Glaucoma   . H. pylori infection   . Hypertension   . Hyponatremia    chronic, s/p endo eval 06/2012  . Hypothyroid   . IBS (irritable bowel syndrome)   . Lumbar disc disease   . Neuropathy of both feet   . Segmental colitis ([place])   . Vertigo   . Wears glasses     Medications:  Scheduled:  . Chlorhexidine Gluconate Cloth  6 each Topical Daily  . diltiazem  60 mg Oral Q6H  . docusate sodium  100 mg Oral BID  . enoxaparin (LOVENOX) injection  40 mg Subcutaneous Q24H  . feeding supplement  1 Container Oral TID BM  . FLUoxetine  10 mg Oral QHS  . levothyroxine  50 mcg Oral Q0600  . mometasone-formoterol  2 puff Inhalation BID  . polyethylene glycol  17 g Oral Daily  . Warfarin - Pharmacist Dosing Inpatient   Does not apply q1600   Infusions:  . sodium chloride Stopped (02/10/20 1851)  .  dextrose 5 % and 0.45 % NaCl with KCl 20 mEq/L 50 mL/hr at 02/15/20 2250  . famotidine (PEPCID) IV 20 mg (02/15/20 1724)  . lactated ringers    . methocarbamol (ROBAXIN) IV Stopped (02/10/20 2258)   PRN: acetaminophen **OR** acetaminophen, guaiFENesin-dextromethorphan, methocarbamol (ROBAXIN) IV, morphine injection, ondansetron **OR** ondansetron (ZOFRAN) IV Anti-infectives (From admission, onward)   Start     Dose/Rate Route Frequency Ordered Stop   02/08/20 1700  meropenem (MERREM) 1 g in sodium chloride 0.9 % 100 mL IVPB  Status:  Discontinued        1 g 200 mL/hr over 30 Minutes Intravenous Every 8 hours 02/08/20 1639 02/12/20 0959   02/08/20 1530  cefTRIAXone (ROCEPHIN) 2 g in sodium chloride 0.9 % 100 mL IVPB  Status:  Discontinued        2 g 200 mL/hr over  30 Minutes Intravenous Every 24 hours 02/08/20 1502 02/08/20 1639   02/08/20 1515  cefTRIAXone (ROCEPHIN) 2 g in sodium chloride 0.9 % 100 mL IVPB  Status:  Discontinued        2 g 200 mL/hr over 30 Minutes Intravenous Every 24 hours 02/08/20 1508 02/08/20 1512   02/08/20 1500  piperacillin-tazobactam (ZOSYN) IVPB 2.25 g  Status:  Discontinued        2.25 g 100 mL/hr over 30 Minutes Intravenous  Once 02/08/20 1453 02/08/20 1502      Assessment: 86 yo female with a history of permanent atrial fibrillation presents with abdominal pain and coffee-ground emesis. The patient was found to have small bowel ischemia and underwent exploratory laparotomy, lysis of adhesions, small bowel resection and gastrostomy tube placementon 02/08/20. The patient's INR upon admit was supratherapeutic at 3.3 and the patient received KCentra 1625 units once prior to surgery.  She is on warfarin PTA for afib and pharmacy consulted to restart on 8/15. Will dose low for now and watch INR trend -INR= 1.5, Hg= 9.5   Goal of Therapy:  INR 2-3 Monitor platelets by anticoagulation protocol: Yes   Plan:  Warfarin 1.25 mg PO tonight Enoxaparin 40 mg SubQ q24h Obtain daily PT/INR Will change CBC to MWF only  Hildred Laser, PharmD Clinical Pharmacist **Pharmacist phone directory can now be found on [email or web address] ([email or web address]).  Listed under [place].

## 2020-02-16 NOTE — Progress Notes (Signed)
Physical Therapy Treatment Patient Details Name: Stephanie Cortez MRN: [id number] DOB: 1933-07-25 Today's Date: 02/16/2020    History of Present Illness 86yo female c/o worsening abdominal pain. Tachycardic and tachypneic, also hypotensive in the ED. CTA shows small bowel ischemia in mid/lower abdomen. Received exploratory laparotomy, small bowel resection, and gastrostomy tube placement 8/9. PMH vertigo, BLE neuropathy, IBS, HTN, COPD, CHF, A-fib, anxiety, R THA and subsequent revision, hx foot surgery    PT Comments    Pt admitted with above diagnosis. Pt was able to ambulate with RW a short distance and needs mod assist to come to EOB, min assist to stand and min assist to ambulate with rW.  Progressing slowly.  Also performed some exercises.   Pt currently with functional limitations due to balance and endurance deficits. Pt will benefit from skilled PT to increase their independence and safety with mobility to allow discharge to the venue listed below.     Follow Up Recommendations  CIR     Equipment Recommendations  Rolling walker with 5" wheels;3in1 (PT)    Recommendations for Other Services       Precautions / Restrictions Precautions Precautions: Fall Precaution Comments: gastric drainage bag/abdominal incisions, recent hx of R posterior THA WBAT, posterior lean Restrictions Weight Bearing Restrictions: No    Mobility  Bed Mobility Overal bed mobility: Needs Assistance Bed Mobility: Rolling Rolling: Mod assist Sidelying to sit: Mod assist;+2 for physical assistance Supine to sit: Mod assist;+2 for physical assistance;HOB elevated     General bed mobility comments: mod assist to roll, assist to get LES off bed and for elevation of trunk.   Transfers Overall transfer level: Needs assistance Equipment used: Rolling walker (2 wheeled) Transfers: Sit to/from [facility] Sit to Stand: Min assist         General transfer comment: min assist to power  up.  Cues for hand placement. Steadyng assist needed but did obtain balance once up.   Ambulation/Gait Ambulation/Gait assistance: Min assist Gait Distance (Feet): 35 Feet Assistive device: Rolling walker (2 wheeled) Gait Pattern/deviations: Step-through pattern;Decreased weight shift to left;Decreased stance time - left;Trunk flexed Gait velocity: slowed Gait velocity interpretation: <1.31 ft/sec, indicative of household ambulator General Gait Details: min A for steadying with gait, decreased weightshift due to L hip pain from prior L THA   Stairs             Wheelchair Mobility    Modified Rankin (Stroke Patients Only)       Balance Overall balance assessment: Needs assistance Sitting-balance support: Feet supported;Bilateral upper extremity supported Sitting balance-Leahy Scale: Fair Sitting balance - Comments: initial posterior lean with min A for steadying able to progress to independent   Postural control: Posterior lean Standing balance support: Bilateral upper extremity supported;During functional activity Standing balance-Leahy Scale: Poor Standing balance comment: requires B UE support on UEs and external support for balance                            Cognition Arousal/Alertness: Awake/alert Behavior During Therapy: WFL for tasks assessed/performed;Flat affect Overall Cognitive Status: Within Functional Limits for tasks assessed                                 General Comments: very pleasant and cooperative, lots of experience in rehab and reports she wants to work hard to get better  Exercises General Exercises - Lower Extremity Ankle Circles/Pumps: AROM;5 reps Quad Sets: AROM;10 reps Long Arc Quad: AROM;Both;10 reps;Seated Heel Slides: AROM;Both;10 reps;Supine Hip Flexion/Marching: AROM;Both;10 reps;Seated    General Comments General comments (skin integrity, edema, etc.): VSS      Pertinent Vitals/Pain Pain  Assessment: No/denies pain    Home Living                      Prior Function            PT Goals (current goals can now be found in the care plan section) Acute Rehab PT Goals Patient Stated Goal: go to rehab and get stronger Time For Goal Achievement: 02/23/20 Potential to Achieve Goals: Good Progress towards PT goals: Progressing toward goals    Frequency    Min 3X/week      PT Plan Current plan remains appropriate    Co-evaluation              AM-PAC PT "6 Clicks" Mobility   Outcome Measure  Help needed turning from your back to your side while in a flat bed without using bedrails?: A Little Help needed moving from lying on your back to sitting on the side of a flat bed without using bedrails?: A Lot Help needed moving to and from a bed to a chair (including a wheelchair)?: A Little Help needed standing up from a chair using your arms (e.g., wheelchair or bedside chair)?: A Little Help needed to walk in hospital room?: A Little Help needed climbing 3-5 steps with a railing? : A Lot 6 Click Score: 16    End of Session Equipment Utilized During Treatment: Gait belt Activity Tolerance: Patient limited by pain;Patient limited by fatigue Patient left: with call bell/phone within reach;in chair;with chair alarm set Nurse Communication: Mobility status PT Visit Diagnosis: Unsteadiness on feet (R26.81);Difficulty in walking, not elsewhere classified (R26.2);Muscle weakness (generalized) (M62.81)     Time: 5537-4827 PT Time Calculation (min) (ACUTE ONLY): 16 min  Charges:  $Gait Training: 8-22 mins                     Sharissa Brierley W,PT [place] Pager:  [phone]  Office:  [place] 02/16/2020, 10:09 AM

## 2020-02-16 NOTE — Discharge Instructions (Signed)
Remove abdominal staples on Friday 8/20  [facility], [place] [id number]  OPEN ABDOMINAL SURGERY: POST OP INSTRUCTIONS  Always review your discharge instruction sheet given to you by the facility where your surgery was performed.  IF YOU HAVE DISABILITY OR FAMILY LEAVE FORMS, YOU MUST BRING THEM TO THE OFFICE FOR PROCESSING.  PLEASE DO NOT GIVE THEM TO YOUR DOCTOR.  1. A prescription for pain medication may be given to you upon discharge.  Take your pain medication as prescribed, if needed.  If narcotic pain medicine is not needed, then you may take acetaminophen (Tylenol) or ibuprofen (Advil) as needed. 2. Take your usually prescribed medications unless otherwise directed. 3. If you need a refill on your pain medication, please contact your pharmacy. They will contact our office to request authorization.  Prescriptions will not be filled after 5pm or on week-ends. 4. You should follow a light diet the first few days after arrival home, such as soup and crackers, pudding, etc.unless your doctor has advised otherwise. A high-fiber, low fat diet can be resumed as tolerated.   Be sure to include lots of fluids daily. Most patients will experience some swelling and bruising on the chest and neck area.  Ice packs will help.  Swelling and bruising can take several days to resolve 5. Most patients will experience some swelling and bruising in the area of the incision. Ice pack will help. Swelling and bruising can take several days to resolve..  6. It is common to experience some constipation if taking pain medication after surgery.  Increasing fluid intake and taking a stool softener will usually help or prevent this problem from occurring.  A mild laxative (Milk of Magnesia or Miralax) should be taken according to package directions if there are no bowel movements after 48 hours. 7.  You may have steri-strips (small skin tapes) in place directly over the incision.  These strips should  be left on the skin for 7-10 days.  If your surgeon used skin glue on the incision, you may shower in 24 hours.  The glue will flake off over the next 2-3 weeks.  Any sutures or staples will be removed at the office during your follow-up visit. You may find that a light gauze bandage over your incision may keep your staples from being rubbed or pulled. You may shower and replace the bandage daily. 8. ACTIVITIES:  You may resume regular (light) daily activities beginning the next day--such as daily self-care, walking, climbing stairs--gradually increasing activities as tolerated.  You may have sexual intercourse when it is comfortable.  Refrain from any heavy lifting or straining until approved by your doctor. a. You may drive when you no longer are taking prescription pain medication, you can comfortably wear a seatbelt, and you can safely maneuver your car and apply brakes b. Return to Work: ___________________________________ 22. You should see your doctor in the office for a follow-up appointment approximately two weeks after your surgery.  Make sure that you call for this appointment within a day or two after you arrive home to insure a convenient appointment time. OTHER INSTRUCTIONS:  _____________________________________________________________ _____________________________________________________________  WHEN TO CALL YOUR DOCTOR: 1. Fever over 101.0 2. Inability to urinate 3. Nausea and/or vomiting 4. Extreme swelling or bruising 5. Continued bleeding from incision. 6. Increased pain, redness, or drainage from the incision. 7. Difficulty swallowing or breathing 8. Muscle cramping or spasms. 9. Numbness or tingling in hands or feet or around lips.  The clinic staff is available to answer your questions during regular business hours.  Please dont hesitate to call and ask to speak to one of the nurses if you have concerns.  For further questions, please visit  [email or web address]   Gastrostomy Tube Replacement, Care After This sheet gives you information about how to care for yourself after your procedure. Your health care provider may also give you more specific instructions. If you have problems or questions, contact your health care provider. What can I expect after the procedure? After the procedure, it is common to have:  Mild pain in your abdomen.  A small amount of blood-tinged fluid leaking from the replacement site. Follow these instructions at home:   You may resume your normal level of activity.  You may resume your normal feedings.  Wash your hands before and after caring for your gastrostomy tube.  Check the skin around the tube site insertion for redness, a rash, swelling, drainage, or extra tissue growth. If you notice any of these, call your health care provider.  Care for your gastrostomy tube as you did before, or as directed by your health care provider. Contact a health care provider if:  You have a fever or chills.  You have redness or irritation near the insertion site.  You continue to have pain in the abdomen or leaking around your gastrostomy tube. Get help right away if:  You develop bleeding or a lot of discharge around the tube.  You have severe pain in the abdomen.  Your new tube is not working properly.  You are unable to get feedings into the tube.  Your tube comes out for any reason. Summary  Follow specific instructions as told by your health care provider. If you have problems or questions, contact your health care provider.  After the procedure you may resume your normal level of activity and normal feedings.  Care for your gastrostomy tube as you did before, or as directed by your health care provider. This information is not intended to replace advice given to you by your health care provider. Make sure you discuss any questions you have with your health care  provider. Document Revised: 07/24/2017 Document Reviewed: 07/24/2017 Elsevier Patient Education  2020 [employer].

## 2020-02-16 NOTE — Progress Notes (Signed)
PROGRESS NOTE    Stephanie Cortez  XKP:[id number] DOB: 05-08-1934 DOA: 02/08/2020 PCP: [name], MD    Chief Complaint  Patient presents with  . Abdominal Pain  . Diarrhea  . Nausea    Brief Narrative:   86 year old lady with prior history of permanent atrial fibrillation on Coumadin, colitis associated with diverticulitis, IBS, achalasia, hiatal hernia, chronic back pain, hypertension, hypothyroidism, GERD, depression, anxiety, right hip fracture s/p recent total hip replacement on 11/16/2019 presents to ED with abdominal pain and coffee-ground emesis.  She was found to have small bowel ischemia secondary to bowel adhesions. General surgery consulted and she underwent Exploratory laparotomy, lysis of adhesions, small bowel resection, gastrostomy tube placement8/9 Dr. Georgette Dover. Surgical pathology shows hemorrhagic necrosis consistent with ischemia. G TUBE clamped and she was started on clears , was able to tolerate without any nausea, vomiting or abd pain. She is able to pass flatus and ambulating as tolerating. gen surgery advanced diet to soft diet.  Still no BM yet.  Pt seen and examined at bedside no new complaints.  Assessment & Plan:   Principal Problem:   Small bowel ischemia (HCC) Active Problems:   HYPERTENSION, BENIGN   A-fib (HCC)   Hypothyroid   GERD (gastroesophageal reflux disease)   Hyponatremia   Depression   Sepsis (HCC)   Thrombocytosis (HCC)   Abnormal CT of the abdomen  Sepsis secondary to small bowel ischemia, peritonitis.   Patient underwent exploratory laparotomy, lysis of additions, small bowel resection, gastrostomy tube placement by Dr. Georgette Dover on 02/08/2020.  Patient was started on meropenem, completed 5 days of treatment.  G tube clamping trials going on with clear liquid diet,  PT /OT evaluation recommending CIR.  Recommend getting out of bed and ambulation encouraged. Pt afebrile and wbc wnl.  Lactic acid wnl.  Pro calcitonin is 0.20  G TUBE  clamped and she was started on clears , was able to tolerate without any nausea, vomiting or abd pain. She is able to pass flatus and ambulating as tolerating. gen surgery advanced diet to solids today, suppository ordered by surgery. Waiting for BM. Meanwhile CIR on board to assist with disposition      Hypokalemia, hypophosphatemia;  Replaced.    History of recent right total hip replacement PT evaluation recommending CIR.  CIR on board.   Essential hypertension Well controlled.  Hypothyroidism Continue his levothyroxine.   Chronic atrial fibrillation Rate  Better controlled with Cardizem 60 mg every 6 hours. Change to 240 mg daily when able to tolerate soft diet.  Pt presented with supra therapeutic INR AND coumadin has been on hold for the procedure.  Repeat hemoglobin stable around 9 and she was started on Coumadin. Hemoglobin staying around 9.  Echocardiogram from 10/2019 showed  Left ventricular ejection fraction, by estimation, is 60 to 65%. The  left ventricle has normal function. The left ventricle has no regional wall motion abnormalities. Unable to assess LV diastolic filling due to underlying atrial fibrillation Patient's CHA2DS2-VASc score is 5   Depression and anxiety  Resume fluoxetine 10 mg daily.    Mild hyponatremia;  Much improved.    Chronic diastolic heart failure She appears to be compensated.  Echocardiogram from 4/21 reviewed.  Not on diuretics at this time.    Lactic acidosis on 02/08/2020 Resolved.   Anemia of blood loss from the procedure/ post op anemia and anemia of acute illness:  Baseline hemoglobin around 11. It dropped to 9 on 8/13, recheck hemoglobin is stable around  9. Transfuse to keep hemoglobin greater than 7.    DVT prophylaxis: (Lovenox) Code Status: (Full code.  Family Communication: family at bedside.  Disposition:   Status is: Inpatient  Remains inpatient appropriate unsafe d/c plan. , waiting for a bed at CIR.     Dispo:  Patient From: Home  Planned Disposition: CIR  Expected discharge date: 02/17/20  Medically stable for discharge: YES        Consultants:   Surgery.    Procedures: S/p exploratory laparotomy, lysis of additions, small bowel resection, gastrostomy tube placement by Dr. To ED on 02/08/2020.   Antimicrobials:  Anti-infectives (From admission, onward)   Start     Dose/Rate Route Frequency Ordered Stop   02/08/20 1700  meropenem (MERREM) 1 g in sodium chloride 0.9 % 100 mL IVPB  Status:  Discontinued        1 g 200 mL/hr over 30 Minutes Intravenous Every 8 hours 02/08/20 1639 02/12/20 0959   02/08/20 1530  cefTRIAXone (ROCEPHIN) 2 g in sodium chloride 0.9 % 100 mL IVPB  Status:  Discontinued        2 g 200 mL/hr over 30 Minutes Intravenous Every 24 hours 02/08/20 1502 02/08/20 1639   02/08/20 1515  cefTRIAXone (ROCEPHIN) 2 g in sodium chloride 0.9 % 100 mL IVPB  Status:  Discontinued        2 g 200 mL/hr over 30 Minutes Intravenous Every 24 hours 02/08/20 1508 02/08/20 1512   02/08/20 1500  piperacillin-tazobactam (ZOSYN) IVPB 2.25 g  Status:  Discontinued        2.25 g 100 mL/hr over 30 Minutes Intravenous  Once 02/08/20 1453 02/08/20 1502     .    Subjective: Able to pass flatus, no BM yet. No new complaints.   Objective: Vitals:   02/16/20 0500 02/16/20 0835 02/16/20 0848 02/16/20 1345  BP:  (!) 135/57  (!) 127/56  Pulse:  79    Resp:  (!) 22    Temp:  97.8 F (36.6 C)    TempSrc:  Oral    SpO2:  97% 98%   Weight: 60.9 kg     Height:        Intake/Output Summary (Last 24 hours) at 02/16/2020 1601 Last data filed at 02/16/2020 0630 Gross per 24 hour  Intake 1365 ml  Output 500 ml  Net 865 ml   Filed Weights   02/13/20 0500 02/14/20 0445 02/16/20 0500  Weight: 79.3 kg 76.4 kg 60.9 kg    Examination:  General exam: alert and comfortable , not in distress.  Respiratory system: air entry fair, no wheezing or rhonchi.  Cardiovascular system:  s1s2. Irregularly irregular, no JVD, no pedal edema.  Gastrointestinal system: abd is soft mildly tender generalized, bowel sounds present.  Central nervous system: alert and oriented, non focal  Extremities: no cyanosis or clubbing.  Skin: No rashes seen.  Psychiatry: mood is appropriate.     Data Reviewed: I have personally reviewed following labs and imaging studies  CBC: Recent Labs  Lab 02/10/20 0453 02/12/20 0400 02/14/20 0545 02/15/20 0515 02/16/20 0404  WBC 10.1 7.9 9.0 9.7 7.2  NEUTROABS 8.1*  --   --   --   --   HGB 11.6* 9.1* 9.3* 9.5* 9.5*  HCT 37.2 28.9* 29.8* 30.7* 30.4*  MCV 92.8 93.5 92.3 91.1 92.7  PLT 229 310 353 392 272    Basic Metabolic Panel: Recent Labs  Lab 02/10/20 0453 02/10/20 0453 02/11/20 0527 02/11/20 1103 02/12/20  0400 02/13/20 0340 02/14/20 0545 02/15/20 0515  NA 135   < > 134*  --  132* 132* 132* 133*  K 3.7   < > 3.8  --  4.3 4.7 4.4 4.0  CL 101   < > 101  --  99 98 97* 97*  CO2 28   < > 27  --  28 28 28 27   GLUCOSE 134*   < > 110*  --  115* 114* 111* 108*  BUN 9   < > <5*  --  <5* <5* <5* <5*  CREATININE 0.53   < > 0.45  --  0.48 0.46 0.46 0.47  CALCIUM 8.0*   < > 8.1*  --  8.2* 8.4* 8.7* 8.8*  MG 2.3  --   --   --  1.7  --   --   --   PHOS 1.1*  --   --  1.7* 3.3  --   --   --    < > = values in this interval not displayed.    GFR: Estimated Creatinine Clearance: 49 mL/min (by C-G formula based on SCr of 0.47 mg/dL).  Liver Function Tests: Recent Labs  Lab 02/10/20 0453  AST 15  ALT 9  ALKPHOS 58  BILITOT 0.4  PROT 4.6*  ALBUMIN 2.2*    CBG: No results for input(s): GLUCAP in the last 168 hours.   Recent Results (from the past 240 hour(s))  SARS Coronavirus 2 by RT PCR (hospital order, performed in [facility] hospital lab) Nasopharyngeal Nasopharyngeal Swab     Status: None   Collection Time: 02/08/20  1:26 PM   Specimen: Nasopharyngeal Swab  Result Value Ref Range Status   SARS Coronavirus 2 NEGATIVE  NEGATIVE Final    Comment: (NOTE) SARS-CoV-2 target nucleic acids are NOT DETECTED.  The SARS-CoV-2 RNA is generally detectable in upper and lower respiratory specimens during the acute phase of infection. The lowest concentration of SARS-CoV-2 viral copies this assay can detect is 250 copies / mL. A negative result does not preclude SARS-CoV-2 infection and should not be used as the sole basis for treatment or other patient management decisions.  A negative result may occur with improper specimen collection / handling, submission of specimen other than nasopharyngeal swab, presence of viral mutation(s) within the areas targeted by this assay, and inadequate number of viral copies (<250 copies / mL). A negative result must be combined with clinical observations, patient history, and epidemiological information.  Fact Sheet for Patients:   StrictlyIdeas.no  Fact Sheet for Healthcare Providers: BankingDealers.co.za  This test is not yet approved or  cleared by the [place] FDA and has been authorized for detection and/or diagnosis of SARS-CoV-2 by FDA under an Emergency Use Authorization (EUA).  This EUA will remain in effect (meaning this test can be used) for the duration of the COVID-19 declaration under Section 564(b)(1) of the Act, 21 U.S.C. section 360bbb-3(b)(1), unless the authorization is terminated or revoked sooner.  Performed at [facility], [place] [address], [place], [place] [address]          Radiology Studies: No results found.      Scheduled Meds: . Chlorhexidine Gluconate Cloth  6 each Topical Daily  . diltiazem  60 mg Oral Q6H  . docusate sodium  100 mg Oral BID  . enoxaparin (LOVENOX) injection  40 mg Subcutaneous Q24H  . feeding supplement  1 Container Oral TID BM  . FLUoxetine  10 mg Oral  QHS  . levothyroxine  50 mcg Oral Q0600  . mometasone-formoterol  2 puff Inhalation BID  .  polyethylene glycol  17 g Oral Daily  . warfarin  1.25 mg Oral ONCE-1600  . Warfarin - Pharmacist Dosing Inpatient   Does not apply q1600   Continuous Infusions: . sodium chloride Stopped (02/10/20 1851)  . dextrose 5 % and 0.45 % NaCl with KCl 20 mEq/L 50 mL/hr at 02/15/20 2250  . famotidine (PEPCID) IV 20 mg (02/15/20 1724)  . lactated ringers    . methocarbamol (ROBAXIN) IV Stopped (02/10/20 2258)     LOS: 8 days        Hosie Poisson, MD [facility]   To contact the attending provider between 7A-7P or the covering provider during after hours 7P-7A, please log into the web site [email or web address] and access using universal  password for that web site. If you do not have the password, please call the hospital operator.  02/16/2020, 4:01 PM

## 2020-02-17 ENCOUNTER — Encounter (HOSPITAL_COMMUNITY): Payer: Self-pay | Admitting: Physical Medicine & Rehabilitation

## 2020-02-17 ENCOUNTER — Other Ambulatory Visit: Payer: Self-pay

## 2020-02-17 ENCOUNTER — Inpatient Hospital Stay (HOSPITAL_COMMUNITY)
Admission: RE | Admit: 2020-02-17 | Discharge: 2020-03-02 | DRG: 945 | Disposition: A | Discharge status: Home Health | Payer: Medicare Other | Source: Intra-hospital | Attending: Physical Medicine & Rehabilitation | Admitting: Physical Medicine & Rehabilitation

## 2020-02-17 DIAGNOSIS — M549 Dorsalgia, unspecified: Secondary | ICD-10-CM | POA: Diagnosis present

## 2020-02-17 DIAGNOSIS — Z6826 Body mass index (BMI) 26.0-26.9, adult: Secondary | ICD-10-CM

## 2020-02-17 DIAGNOSIS — Z801 Family history of malignant neoplasm of trachea, bronchus and lung: Secondary | ICD-10-CM

## 2020-02-17 DIAGNOSIS — Z79890 Hormone replacement therapy (postmenopausal): Secondary | ICD-10-CM

## 2020-02-17 DIAGNOSIS — R5381 Other malaise: Secondary | ICD-10-CM | POA: Diagnosis not present

## 2020-02-17 DIAGNOSIS — R0602 Shortness of breath: Secondary | ICD-10-CM | POA: Diagnosis not present

## 2020-02-17 DIAGNOSIS — G8929 Other chronic pain: Secondary | ICD-10-CM | POA: Diagnosis present

## 2020-02-17 DIAGNOSIS — Z841 Family history of disorders of kidney and ureter: Secondary | ICD-10-CM

## 2020-02-17 DIAGNOSIS — Z90710 Acquired absence of both cervix and uterus: Secondary | ICD-10-CM

## 2020-02-17 DIAGNOSIS — R791 Abnormal coagulation profile: Secondary | ICD-10-CM | POA: Diagnosis not present

## 2020-02-17 DIAGNOSIS — E871 Hypo-osmolality and hyponatremia: Secondary | ICD-10-CM | POA: Diagnosis not present

## 2020-02-17 DIAGNOSIS — Z931 Gastrostomy status: Secondary | ICD-10-CM | POA: Diagnosis not present

## 2020-02-17 DIAGNOSIS — K5792 Diverticulitis of intestine, part unspecified, without perforation or abscess without bleeding: Secondary | ICD-10-CM | POA: Diagnosis present

## 2020-02-17 DIAGNOSIS — E8809 Other disorders of plasma-protein metabolism, not elsewhere classified: Secondary | ICD-10-CM | POA: Diagnosis present

## 2020-02-17 DIAGNOSIS — Z9841 Cataract extraction status, right eye: Secondary | ICD-10-CM

## 2020-02-17 DIAGNOSIS — I4891 Unspecified atrial fibrillation: Secondary | ICD-10-CM | POA: Diagnosis present

## 2020-02-17 DIAGNOSIS — R195 Other fecal abnormalities: Secondary | ICD-10-CM | POA: Diagnosis not present

## 2020-02-17 DIAGNOSIS — E785 Hyperlipidemia, unspecified: Secondary | ICD-10-CM | POA: Diagnosis present

## 2020-02-17 DIAGNOSIS — Z7901 Long term (current) use of anticoagulants: Secondary | ICD-10-CM

## 2020-02-17 DIAGNOSIS — G8918 Other acute postprocedural pain: Secondary | ICD-10-CM | POA: Diagnosis not present

## 2020-02-17 DIAGNOSIS — Z8371 Family history of colonic polyps: Secondary | ICD-10-CM | POA: Diagnosis not present

## 2020-02-17 DIAGNOSIS — K219 Gastro-esophageal reflux disease without esophagitis: Secondary | ICD-10-CM | POA: Diagnosis present

## 2020-02-17 DIAGNOSIS — K559 Vascular disorder of intestine, unspecified: Secondary | ICD-10-CM | POA: Diagnosis present

## 2020-02-17 DIAGNOSIS — Z8249 Family history of ischemic heart disease and other diseases of the circulatory system: Secondary | ICD-10-CM

## 2020-02-17 DIAGNOSIS — Z881 Allergy status to other antibiotic agents status: Secondary | ICD-10-CM

## 2020-02-17 DIAGNOSIS — E440 Moderate protein-calorie malnutrition: Secondary | ICD-10-CM | POA: Diagnosis present

## 2020-02-17 DIAGNOSIS — J449 Chronic obstructive pulmonary disease, unspecified: Secondary | ICD-10-CM | POA: Diagnosis present

## 2020-02-17 DIAGNOSIS — Z823 Family history of stroke: Secondary | ICD-10-CM

## 2020-02-17 DIAGNOSIS — F329 Major depressive disorder, single episode, unspecified: Secondary | ICD-10-CM | POA: Diagnosis present

## 2020-02-17 DIAGNOSIS — Z79899 Other long term (current) drug therapy: Secondary | ICD-10-CM

## 2020-02-17 DIAGNOSIS — Z741 Need for assistance with personal care: Secondary | ICD-10-CM | POA: Diagnosis present

## 2020-02-17 DIAGNOSIS — E039 Hypothyroidism, unspecified: Secondary | ICD-10-CM | POA: Diagnosis present

## 2020-02-17 DIAGNOSIS — D62 Acute posthemorrhagic anemia: Secondary | ICD-10-CM | POA: Diagnosis present

## 2020-02-17 DIAGNOSIS — E46 Unspecified protein-calorie malnutrition: Secondary | ICD-10-CM | POA: Diagnosis not present

## 2020-02-17 DIAGNOSIS — J849 Interstitial pulmonary disease, unspecified: Secondary | ICD-10-CM | POA: Diagnosis present

## 2020-02-17 DIAGNOSIS — Z800 Family history of malignant neoplasm of digestive organs: Secondary | ICD-10-CM | POA: Diagnosis not present

## 2020-02-17 DIAGNOSIS — Z825 Family history of asthma and other chronic lower respiratory diseases: Secondary | ICD-10-CM | POA: Diagnosis not present

## 2020-02-17 DIAGNOSIS — M79651 Pain in right thigh: Secondary | ICD-10-CM | POA: Diagnosis not present

## 2020-02-17 DIAGNOSIS — I5042 Chronic combined systolic (congestive) and diastolic (congestive) heart failure: Secondary | ICD-10-CM | POA: Diagnosis present

## 2020-02-17 DIAGNOSIS — I517 Cardiomegaly: Secondary | ICD-10-CM | POA: Diagnosis not present

## 2020-02-17 DIAGNOSIS — I110 Hypertensive heart disease with heart failure: Secondary | ICD-10-CM | POA: Diagnosis present

## 2020-02-17 DIAGNOSIS — K5900 Constipation, unspecified: Secondary | ICD-10-CM | POA: Diagnosis not present

## 2020-02-17 DIAGNOSIS — Z9842 Cataract extraction status, left eye: Secondary | ICD-10-CM

## 2020-02-17 DIAGNOSIS — Z880 Allergy status to penicillin: Secondary | ICD-10-CM

## 2020-02-17 DIAGNOSIS — Z7951 Long term (current) use of inhaled steroids: Secondary | ICD-10-CM | POA: Diagnosis not present

## 2020-02-17 DIAGNOSIS — K580 Irritable bowel syndrome with diarrhea: Secondary | ICD-10-CM | POA: Diagnosis present

## 2020-02-17 DIAGNOSIS — A419 Sepsis, unspecified organism: Secondary | ICD-10-CM | POA: Diagnosis present

## 2020-02-17 DIAGNOSIS — F419 Anxiety disorder, unspecified: Secondary | ICD-10-CM | POA: Diagnosis present

## 2020-02-17 DIAGNOSIS — Z96641 Presence of right artificial hip joint: Secondary | ICD-10-CM | POA: Diagnosis present

## 2020-02-17 DIAGNOSIS — I4821 Permanent atrial fibrillation: Secondary | ICD-10-CM | POA: Diagnosis not present

## 2020-02-17 LAB — CBC
HCT: 29.9 % — ABNORMAL LOW (ref 36.0–46.0)
Hemoglobin: 9.6 g/dL — ABNORMAL LOW (ref 12.0–15.0)
MCH: 29.2 pg (ref 26.0–34.0)
MCHC: 32.1 g/dL (ref 30.0–36.0)
MCV: 90.9 fL (ref 80.0–100.0)
Platelets: 399 10*3/uL (ref 150–400)
RBC: 3.29 MIL/uL — ABNORMAL LOW (ref 3.87–5.11)
RDW: 14.4 % (ref 11.5–15.5)
WBC: 8.5 10*3/uL (ref 4.0–10.5)
nRBC: 0 % (ref 0.0–0.2)

## 2020-02-17 LAB — PROTIME-INR
INR: 1.4 — ABNORMAL HIGH (ref 0.8–1.2)
Prothrombin Time: 16.8 seconds — ABNORMAL HIGH (ref 11.4–15.2)

## 2020-02-17 MED ORDER — POLYETHYLENE GLYCOL 3350 17 G PO PACK
17.0000 g | PACK | Freq: Every day | ORAL | Status: DC
  Administered 2020-02-18 – 2020-02-20 (×3): 17 g via ORAL
  Filled 2020-02-17 (×3): qty 1

## 2020-02-17 MED ORDER — WARFARIN 1.25 MG HALF TABLET
1.2500 mg | ORAL_TABLET | Freq: Once | ORAL | Status: AC
  Administered 2020-02-17: 1.25 mg via ORAL
  Filled 2020-02-17: qty 1

## 2020-02-17 MED ORDER — FAMOTIDINE 20 MG PO TABS
20.0000 mg | ORAL_TABLET | Freq: Every day | ORAL | Status: DC
  Administered 2020-02-17 – 2020-03-02 (×15): 20 mg via ORAL
  Filled 2020-02-17 (×15): qty 1

## 2020-02-17 MED ORDER — POLYETHYLENE GLYCOL 3350 17 G PO PACK: 17.0000 g | PACK | Freq: Every day | ORAL | 0 refills | Status: DC

## 2020-02-17 MED ORDER — ONDANSETRON HCL 4 MG/2ML IJ SOLN: 4.0000 mg | Freq: Four times a day (QID) | INTRAMUSCULAR | Status: DC | PRN

## 2020-02-17 MED ORDER — ONDANSETRON HCL 4 MG PO TABS: 4.0000 mg | ORAL_TABLET | Freq: Four times a day (QID) | ORAL | Status: DC | PRN

## 2020-02-17 MED ORDER — DOCUSATE SODIUM 100 MG PO CAPS
100.0000 mg | ORAL_CAPSULE | Freq: Two times a day (BID) | ORAL | Status: DC
  Administered 2020-02-17 – 2020-02-19 (×4): 100 mg via ORAL
  Filled 2020-02-17 (×7): qty 1

## 2020-02-17 MED ORDER — MELATONIN 10 MG PO TABS: 10.0000 mg | ORAL_TABLET | Freq: Every evening | ORAL | 0 refills | Status: DC | PRN

## 2020-02-17 MED ORDER — MOMETASONE FURO-FORMOTEROL FUM 100-5 MCG/ACT IN AERO
2.0000 | INHALATION_SPRAY | Freq: Two times a day (BID) | RESPIRATORY_TRACT | Status: DC
  Administered 2020-02-17 – 2020-03-01 (×25): 2 via RESPIRATORY_TRACT
  Filled 2020-02-17: qty 8.8

## 2020-02-17 MED ORDER — LEVOTHYROXINE SODIUM 25 MCG PO TABS
50.0000 ug | ORAL_TABLET | Freq: Every day | ORAL | Status: DC
  Administered 2020-02-18 – 2020-03-02 (×14): 50 ug via ORAL
  Filled 2020-02-17 (×14): qty 2

## 2020-02-17 MED ORDER — ACETAMINOPHEN 325 MG PO TABS
650.0000 mg | ORAL_TABLET | Freq: Four times a day (QID) | ORAL | Status: DC | PRN
  Administered 2020-02-19 – 2020-03-02 (×21): 650 mg via ORAL
  Filled 2020-02-17 (×21): qty 2

## 2020-02-17 MED ORDER — BOOST / RESOURCE BREEZE PO LIQD CUSTOM
1.0000 | Freq: Three times a day (TID) | ORAL | Status: DC
  Administered 2020-02-17 – 2020-03-01 (×27): 1 via ORAL

## 2020-02-17 MED ORDER — WARFARIN 1.25 MG HALF TABLET
1.2500 mg | ORAL_TABLET | Freq: Once | ORAL | Status: DC
  Filled 2020-02-17: qty 1

## 2020-02-17 MED ORDER — FLUOXETINE HCL 10 MG PO CAPS
10.0000 mg | ORAL_CAPSULE | Freq: Every day | ORAL | Status: DC
  Administered 2020-02-17 – 2020-03-01 (×14): 10 mg via ORAL
  Filled 2020-02-17 (×14): qty 1

## 2020-02-17 MED ORDER — WARFARIN - PHARMACIST DOSING INPATIENT: Freq: Every day | Status: DC

## 2020-02-17 MED ORDER — ACETAMINOPHEN 650 MG RE SUPP: 650.0000 mg | Freq: Four times a day (QID) | RECTAL | Status: DC | PRN

## 2020-02-17 MED ORDER — DILTIAZEM HCL 60 MG PO TABS
60.0000 mg | ORAL_TABLET | Freq: Four times a day (QID) | ORAL | Status: DC
  Administered 2020-02-17 – 2020-03-02 (×56): 60 mg via ORAL
  Filled 2020-02-17 (×60): qty 1

## 2020-02-17 MED ORDER — ENOXAPARIN SODIUM 40 MG/0.4ML ~~LOC~~ SOLN
40.0000 mg | SUBCUTANEOUS | Status: DC
  Administered 2020-02-17 – 2020-02-26 (×10): 40 mg via SUBCUTANEOUS
  Filled 2020-02-17 (×10): qty 0.4

## 2020-02-17 MED ORDER — DIPHENHYDRAMINE HCL 25 MG PO CAPS
25.0000 mg | ORAL_CAPSULE | Freq: Every evening | ORAL | Status: DC | PRN
  Administered 2020-02-17: 25 mg via ORAL
  Filled 2020-02-17: qty 1

## 2020-02-17 MED ORDER — MELATONIN 5 MG PO TABS
10.0000 mg | ORAL_TABLET | Freq: Every evening | ORAL | Status: DC | PRN
  Administered 2020-02-20 – 2020-03-01 (×11): 10 mg via ORAL
  Filled 2020-02-17 (×12): qty 2

## 2020-02-17 NOTE — Progress Notes (Signed)
Inpatient Rehabilitation Admissions Coordinator  Cir bed is available today and patient is in agreement to admit. I met at bedside with patient and spoke wit her daughter, [name], by phone. They are in agreement,. I will alert Dr. Karleen Hampshire, acute team and TOC. I will make the arrangements to admit today.  Danne Baxter, RN, MSN Rehab Admissions Coordinator [phone] 02/17/2020 11:32 AM

## 2020-02-17 NOTE — H&P (Signed)
Physical Medicine and Rehabilitation Admission H&P    No chief complaint on file. : HPI: Stephanie Cortez. Bently is an 86 year old right-handed female with history of atrial fibrillation maintained on Coumadin, interstitial lung disease maintained on oxygen, anxiety, diastolic congestive heart failure, COPD, hypertension, chronic back pain, history of right hip fracture status post right hip hemiarthroplasty and received inpatient rehab services 10/30/2019 to 11/16/2019 , hyperlipidemia, colitis associate with diverticulitis/IBS.  History taken from chart review, daughter, and patient.  Patient lives alone.  1 level home 2 steps to entry.  Independent with assistive device.  She presented on 02/08/2020 with abdominal pain, coffee-ground emesis as well as nausea and vomiting.  CT angiogram showed what appeared to be small bowel ischemia in the mid lower abdomen likely secondary to torsion of the mesentery.  She was given Kcentra to reverse INR of 3.3.  Admission chemistry sodium 132, glucose 175, WBC 18,500, hemoglobin 13.9, lactic acid 2.4.  Patient underwent exploratory laparotomy lysis of adhesions small bowel resection gastrostomy tube placement 02/08/2020 per Dr.Tsuei.  Surgical pathology hemorrhagic necrosis consistent with ischemia.  Working with clamping of G-tube and diet slowly advanced.  Chronic Coumadin has been resumed.  Acute blood loss anemia of 9.6 and latest INR of 1.4.  Therapy evaluations completed with recommendations of inpatient rehab services. Please see preadmission assessment from earlier today.   Review of Systems  Constitutional: Positive for malaise/fatigue. Negative for chills.  HENT: Negative for hearing loss.   Eyes: Negative for blurred vision and double vision.  Respiratory: Negative for cough.   Cardiovascular: Negative for chest pain and palpitations.  Gastrointestinal: Positive for abdominal pain. Negative for heartburn.       GERD  Genitourinary: Negative for dysuria,  flank pain and hematuria.  Musculoskeletal: Positive for joint pain and myalgias.  Skin: Negative for rash.  Neurological: Positive for weakness.  Psychiatric/Behavioral: Positive for depression.       Anxiety  All other systems reviewed and are negative.  Past Medical History:  Diagnosis Date  . Anemia    - Hgb 9.7gm% on 07/13/2008 in [place] -  Hgg 129gm% wiht normal irone levsl and ferritin 10/27/2008 in [place] Recurrent otitis/sinusitis  . Anxiety    chronic BZ prn  . Atrial fibrillation (HCC)    chronic anticoag  . Bronchiectasis    >PFT 07/13/2008 in [place] 1.9L/76%, FVC 2.45L/74, Ratio 79, TLC 121%, DLCO 64%  AE BRonchiectasis - Dec 2010.New Rx:  outpatient - Feb 2011 - Rx outpatient  . CHF (congestive heart failure) ([place])   . COPD (chronic obstructive pulmonary disease) (HCC)    bronchiectasis  . Cricopharyngeal achalasia   . Depression   . Diverticulosis   . Dyslipidemia   . Eczema   . GERD with stricture   . Glaucoma   . H. pylori infection   . Hypertension   . Hyponatremia    chronic, s/p endo eval 06/2012  . Hypothyroid   . IBS (irritable bowel syndrome)   . Lumbar disc disease   . Neuropathy of both feet   . Segmental colitis ([place])   . Vertigo   . Wears glasses    Past Surgical History:  Procedure Laterality Date  . BOWEL RESECTION N/A 02/08/2020   Procedure: SMALL BOWEL RESECTION;  Surgeon: Donnie Mesa, MD;  Location: [facility];  Service: General;  Laterality: N/A;  . BREAST SURGERY     br bx  . CARDIAC CATHETERIZATION  07/01/2013  . CATARACT EXTRACTION  both  . COLONOSCOPY    . ESOPHAGOSCOPY W/ BOTOX INJECTION  12/11/2011   Procedure: ESOPHAGOSCOPY WITH BOTOX INJECTION;  Surgeon: Rozetta Nunnery, MD;  Location: [facility];  Service: ENT;  Laterality: N/A;  esophogoscopy with dilation, botox injection  . FOOT SURGERY  03/14/2011   gastroc slide-rt  . GASTROSTOMY N/A 02/08/2020   Procedure: INSERTION OF GASTROSTOMY TUBE;  Surgeon:  Donnie Mesa, MD;  Location: [facility];  Service: General;  Laterality: N/A;  . HIP ARTHROPLASTY Right 10/28/2019   Procedure: ARTHROPLASTY BIPOLAR HIP (HEMIARTHROPLASTY);  Surgeon: Marybelle Killings, MD;  Location: [facility];  Service: Orthopedics;  Laterality: Right;  . LAPAROTOMY N/A 02/08/2020   Procedure: EXPLORATORY LAPAROTOMY;  Surgeon: Donnie Mesa, MD;  Location: [facility];  Service: General;  Laterality: N/A;  . LYSIS OF ADHESION N/A 02/08/2020   Procedure: LYSIS OF ADHESIONS;  Surgeon: Donnie Mesa, MD;  Location: [facility];  Service: General;  Laterality: N/A;  . TOTAL ABDOMINAL HYSTERECTOMY    . TOTAL HIP REVISION Right 11/16/2019   Procedure: TOTAL HIP REVISION BIPOLAR TO CEMENTED BIPOLAR;  Surgeon: Marybelle Killings, MD;  Location: [facility];  Service: Orthopedics;  Laterality: Right;  . WISDOM TOOTH EXTRACTION     Family History  Problem Relation Age of Onset  . Hypertension Mother   . Stroke Mother   . Emphysema Brother   . Other Father        miner's lung  . Cancer Father        Lung  . Colon polyps Sister   . Pancreatic cancer Sister   . Kidney disease Sister   . Atrial fibrillation Other        siblings   Social History:  reports that she has never smoked. She has never used smokeless tobacco. She reports that she does not drink alcohol and does not use drugs. Allergies:  Allergies  Allergen Reactions  . Doxycycline Other (See Comments)    Rash on face and neck  . Vicodin [Hydrocodone-Acetaminophen]     Hallucinations   . Flagyl [Metronidazole] Nausea And Vomiting  . Moxifloxacin Other (See Comments)     Weakness/fatigue  . Pantoprazole Sodium Rash  . Penicillins Rash    Has patient had a PCN reaction causing immediate rash, facial/tongue/throat swelling, SOB or lightheadedness with hypotension:unsure Has patient had a PCN reaction causing severe rash involving mucus membranes or skin necrosis:unsure Has patient had a PCN reaction that required hospitalization:No Has patient had a  PCN reaction occurring within the last 10 years:Yes Cannot recall exact reaction due to time lapse If all of the above answers are "NO", then may proceed with Cephalosporin use.    Medications Prior to Admission  Medication Sig Dispense Refill  . Biotin 5000 MCG TABS Take 5,000 mcg by mouth daily.    . Cholecalciferol (VITAMIN D-3) 125 MCG (5000 UT) TABS Take 5,000 Units by mouth daily.     [name] Kitchen diltiazem (CARDIZEM CD) 240 MG 24 hr capsule Take 1 capsule (240 mg total) by mouth daily. (Patient not taking: Reported on 02/08/2020) 90 capsule 1  . docusate sodium (COLACE) 100 MG capsule Take 1 capsule (100 mg total) by mouth 2 (two) times daily. (Patient not taking: Reported on 02/08/2020) 10 capsule 0  . FLUoxetine (PROZAC) 10 MG tablet Take 10 mg by mouth daily.     . folic acid (FOLVITE) 283 MCG tablet Take 1 mg by mouth 3 (three) times a week. MWF    . levothyroxine (  SYNTHROID, LEVOTHROID) 50 MCG tablet Take 1 tablet (50 mcg total) by mouth daily. (Patient taking differently: Take 50 mcg by mouth daily before breakfast. ) 90 tablet 3  . melatonin 10 MG TABS Take 10 mg by mouth at bedtime as needed (sleep).  0  . methocarbamol (ROBAXIN) 500 MG tablet Take 500 mg by mouth 2 (two) times daily.    Derrill Memo ON 02/18/2020] polyethylene glycol (MIRALAX / GLYCOLAX) 17 g packet Take 17 g by mouth daily. 14 each 0  . pravastatin (PRAVACHOL) 40 MG tablet Take 40 mg by mouth at bedtime.     . SYMBICORT 80-4.5 MCG/ACT inhaler INHALE 2 PUFFS INTO THE LUNGS TWICE DAILY (Patient taking differently: Inhale 2 puffs into the lungs in the morning and at bedtime. ) 10.2 g 3  . vitamin B-12 (CYANOCOBALAMIN) 1000 MCG tablet Take 1,000 mcg by mouth 3 (three) times a week. MWF    . warfarin (COUMADIN) 2.5 MG tablet Take 1/2-1 tablet by mouth daily as directed by [facility] (Patient taking differently: Take 1.25 mg by mouth daily. ) 90 tablet 1  . zinc gluconate 50 MG tablet Take 50 mg by mouth daily.      Drug Regimen  Review Drug regimen was reviewed and remains appropriate with no significant issues identified  Home: Home Living Family/patient expects to be discharged to:: Private residence Living Arrangements: Alone   Functional History:    Functional Status:  Mobility:          ADL:    Cognition:      Physical Exam: Blood pressure (!) 121/52, pulse 77, temperature 98.5 F (36.9 C), resp. rate 16, height 5\' 6"  (1.676 m), weight 73.1 kg, SpO2 97 %. Physical Exam Vitals reviewed.  Constitutional:      General: She is not in acute distress. HENT:     Head: Normocephalic and atraumatic.     Right Ear: External ear normal.     Left Ear: External ear normal.     Nose: Nose normal.  Eyes:     General:        Right eye: No discharge.        Left eye: No discharge.     Extraocular Movements: Extraocular movements intact.  Cardiovascular:     Comments: Irregularly irregular Pulmonary:     Effort: Pulmonary effort is normal. No respiratory distress.     Breath sounds: No stridor.  Abdominal:     General: There is distension.     Tenderness: There is abdominal tenderness.  Musculoskeletal:     Cervical back: Normal range of motion and neck supple.     Comments: No edema or tenderness in extremities  Skin:    Comments: Midline incision clean dry and intact with staples.  Dressing CDI Left neck with dressing CDI  Neurological:     Mental Status: She is alert.     Comments: Patient is alert.   Oriented x3 and follows commands. Motor: Bilateral upper extremities, left lower extremity: 5/5 proximal distal Right lower extremity: Hip flexion 4+/5, knee extension 4+/5, ankle dorsiflexion 5/5  Psychiatric:        Mood and Affect: Mood normal.        Behavior: Behavior normal.     Results for orders placed or performed during the hospital encounter of 02/08/20 (from the past 48 hour(s))  Protime-INR     Status: Abnormal   Collection Time: 02/16/20  4:04 AM  Result Value Ref Range    Prothrombin  Time 17.2 (H) 11.4 - 15.2 seconds   INR 1.5 (H) 0.8 - 1.2    Comment: (NOTE) INR goal varies based on device and disease states. Performed at [facility], [place] [address] [place], [place] [address]   CBC     Status: Abnormal   Collection Time: 02/16/20  4:04 AM  Result Value Ref Range   WBC 7.2 4.0 - 10.5 K/uL   RBC 3.28 (L) 3.87 - 5.11 MIL/uL   Hemoglobin 9.5 (L) 12.0 - 15.0 g/dL   HCT 30.4 (L) 36 - 46 %   MCV 92.7 80.0 - 100.0 fL   MCH 29.0 26.0 - 34.0 pg   MCHC 31.3 30.0 - 36.0 g/dL   RDW 14.6 11.5 - 15.5 %   Platelets 387 150 - 400 K/uL   nRBC 0.0 0.0 - 0.2 %    Comment: Performed at [facility], [place] [address] [place], [place] [address]  Protime-INR     Status: Abnormal   Collection Time: 02/17/20  8:33 AM  Result Value Ref Range   Prothrombin Time 16.8 (H) 11.4 - 15.2 seconds   INR 1.4 (H) 0.8 - 1.2    Comment: (NOTE) INR goal varies based on device and disease states. Performed at [facility], [place] [address], [place], [place] [address]   CBC     Status: Abnormal   Collection Time: 02/17/20  8:33 AM  Result Value Ref Range   WBC 8.5 4.0 - 10.5 K/uL   RBC 3.29 (L) 3.87 - 5.11 MIL/uL   Hemoglobin 9.6 (L) 12.0 - 15.0 g/dL   HCT 29.9 (L) 36 - 46 %   MCV 90.9 80.0 - 100.0 fL   MCH 29.2 26.0 - 34.0 pg   MCHC 32.1 30.0 - 36.0 g/dL   RDW 14.4 11.5 - 15.5 %   Platelets 399 150 - 400 K/uL   nRBC 0.0 0.0 - 0.2 %    Comment: Performed at [facility], [place] [address] [place], [place] [address]   No results found.     Medical Problem List and Plan: 1.  Sepsis with debility secondary to small bowel ischemia status post exploratory laparotomy lysis of adhesions small bowel resection gastrostomy tube placement 02/08/2020  -patient may shower  -ELOS/Goals: 10-14 days/supervision  Admit to CIR 2.  Antithrombotics: -DVT/anticoagulation: Chronic Coumadin for history of atrial fibrillation  Monitor INR  -antiplatelet therapy:  N/A 3. Pain Management: Robaxin as needed 4. Mood: Prozac 10 mg daily  -antipsychotic agents: N/A 5. Neuropsych: This patient is capable of making decisions on her own behalf. 6. Skin/Wound Care: Routine skin checks 7. Fluids/Electrolytes/Nutrition: Routine in and outs.  CMP ordered. 8.  Acute blood loss anemia.    CBC ordered for tomorrow a.m. 9.  Atrial fibrillation.  Continue Cardizem 60 mg every 6 hours.  Cardiac rate controlled.  Chronic Coumadin resumed  Monitor with increased exertion. 10.  Hypothyroidism.  Synthroid  Lavon Paganini Angiulli, PA-C 02/17/2020  I have personally performed a face to face diagnostic evaluation, including, but not limited to relevant history and physical exam findings, of this patient and developed relevant assessment and plan.  Additionally, I have reviewed and concur with the physician assistant's documentation above.  Delice Lesch, MD, ABPMR   The patient's status has not changed. The original post admission physician evaluation remains appropriate, and any changes from the pre-admission screening or documentation from the acute chart are noted above.   Jameeka Marcy Posey Pronto,  MD, ABPMR

## 2020-02-17 NOTE — Progress Notes (Signed)
Patient arrived on unit, oriented to unit. Reviewed medications, therapy schedule, rehab routine and plan of care. States an understanding of information reviewed. No complications noted at this time. Patient reports no pain and is AX4 Liesa Tsan L Jaymason Ledesma  

## 2020-02-17 NOTE — Discharge Summary (Signed)
Physician Discharge Summary  Stephanie Cortez JME:[id number] DOB: 02/05/1934 DOA: 02/08/2020  PCP: [name], MD  Admit date: 02/08/2020 Discharge date: 02/17/2020  Admitted From: Home.  Disposition:  CIR   Recommendations for Outpatient Follow-up:  1. Follow up with PCP in 1-2 weeks 2. Please obtain BMP/CBC in one week 3. Please follow up with general surgery as recommended.  4. Removal of staples on Friday.     Discharge Condition: STABLE.  CODE STATUS: FULL CODE Diet recommendation: Heart Healthy    Brief/Interim Summary: 86 year old lady with prior history of permanent atrial fibrillation on Coumadin, colitis associated with diverticulitis, IBS, achalasia, hiatal hernia, chronic back pain, hypertension, hypothyroidism, GERD, depression, anxiety, right hip fracture s/p recent total hip replacement on 11/16/2019 presents to ED with abdominal pain and coffee-ground emesis.  She was found to have small bowel ischemia secondary to bowel adhesions. General surgery consulted and she underwent Exploratory laparotomy, lysis of adhesions, small bowel resection, gastrostomy tube placement8/9 Dr. Georgette Dover. Surgical pathology shows hemorrhagic necrosis consistent with ischemia. G TUBE clamped and she was started on clears , was able to tolerate without any nausea, vomiting or abd pain. She is able to pass flatus and ambulating as tolerating. gen surgery advanced diet to soft diet.   Discharge Diagnoses:  Principal Problem:   Small bowel ischemia (HCC) Active Problems:   HYPERTENSION, BENIGN   A-fib (HCC)   Hypothyroid   GERD (gastroesophageal reflux disease)   Hyponatremia   Depression   Sepsis (HCC)   Thrombocytosis (HCC)   Abnormal CT of the abdomen  Sepsis secondary to small bowel ischemia, peritonitis.   Patient underwent exploratory laparotomy, lysis of additions, small bowel resection, gastrostomy tube placement by Dr. Georgette Dover on 02/08/2020.  Patient was started on meropenem, completed  5 days of treatment.  G tube clamping trials going on with clear liquid diet,  PT /OT evaluation recommending CIR.  Recommend getting out of bed and ambulation encouraged. Pt afebrile and wbc wnl.  Lactic acid wnl.  Pro calcitonin is 0.20  G TUBE clamped and she was started on clears , was able to tolerate without any nausea, vomiting or abd pain. She is able to pass flatus and ambulating as tolerating. gen surgery advanced diet to solids today, suppository ordered by surgery. pt had a bowel movement and surgery cleared her for discharge to CIR when bed available.  Staples will be removed on Friday.      Hypokalemia, hypophosphatemia;  Replaced.    History of recent right total hip replacement PT evaluation recommending CIR.  CIR on board.   Essential hypertension Well controlled.  Hypothyroidism Continue his levothyroxine.   Chronic atrial fibrillation Rate  Better controlled with Cardizem 60 mg every 6 hours. Change to 240 mg daily when able to tolerate soft diet.  Pt presented with supra therapeutic INR AND coumadin has been on hold for the procedure.  Repeat hemoglobin stable around 9 and she was started on Coumadin. Hemoglobin staying around 9.  INR still sub therapeutic.  Echocardiogram from 10/2019 showed  Left ventricular ejection fraction, by estimation, is 60 to 65%. The  left ventricle has normal function. The left ventricle has no regional wall motion abnormalities. Unable to assess LV diastolic filling due to underlying atrial fibrillation Patient's CHA2DS2-VASc score is 5   Depression and anxiety  Resume fluoxetine 10 mg daily.    Mild hyponatremia;  Much improved.    Chronic diastolic heart failure She appears to be compensated.  Echocardiogram from 4/21 reviewed.  Not on diuretics at this time.    Lactic acidosis on 02/08/2020 Resolved.   Anemia of blood loss from the procedure/ post op anemia and anemia of acute illness:  Baseline  hemoglobin around 11. It dropped to 9 on 8/13, recheck hemoglobin is stable around 9. Transfuse to keep hemoglobin greater than 7.    Discharge Instructions  Discharge Instructions    Diet - low sodium heart healthy   Complete by: As directed    Discharge wound care:   Complete by: As directed    As per general surgery recommendations.   Increase activity slowly   Complete by: As directed      Allergies as of 02/17/2020      Reactions   Doxycycline Other (See Comments)   Rash on face and neck   Vicodin [hydrocodone-acetaminophen]    Hallucinations    Flagyl [metronidazole] Nausea And Vomiting   Moxifloxacin Other (See Comments)    Weakness/fatigue   Pantoprazole Sodium Rash   Penicillins Rash   Has patient had a PCN reaction causing immediate rash, facial/tongue/throat swelling, SOB or lightheadedness with hypotension:unsure Has patient had a PCN reaction causing severe rash involving mucus membranes or skin necrosis:unsure Has patient had a PCN reaction that required hospitalization:No Has patient had a PCN reaction occurring within the last 10 years:Yes Cannot recall exact reaction due to time lapse If all of the above answers are "NO", then may proceed with Cephalosporin use.      Medication List    STOP taking these medications   bisoprolol 10 MG tablet Commonly known as: ZEBETA   omeprazole 20 MG capsule Commonly known as: PRILOSEC   traMADol 50 MG tablet Commonly known as: ULTRAM     TAKE these medications   Biotin 5000 MCG Tabs Take 5,000 mcg by mouth daily.   diltiazem 240 MG 24 hr capsule Commonly known as: CARDIZEM CD Take 1 capsule (240 mg total) by mouth daily.   docusate sodium 100 MG capsule Commonly known as: COLACE Take 1 capsule (100 mg total) by mouth 2 (two) times daily.   FLUoxetine 10 MG tablet Commonly known as: PROZAC Take 10 mg by mouth daily.   folic acid 884 MCG tablet Commonly known as: FOLVITE Take 1 mg by mouth 3 (three)  times a week. MWF   levothyroxine 50 MCG tablet Commonly known as: SYNTHROID Take 1 tablet (50 mcg total) by mouth daily. What changed: when to take this   Melatonin 10 MG Tabs Take 10 mg by mouth at bedtime as needed (sleep).   methocarbamol 500 MG tablet Commonly known as: ROBAXIN Take 500 mg by mouth 2 (two) times daily.   polyethylene glycol 17 g packet Commonly known as: MIRALAX / GLYCOLAX Take 17 g by mouth daily. Start taking on: February 18, 2020   pravastatin 40 MG tablet Commonly known as: PRAVACHOL Take 40 mg by mouth at bedtime.   Symbicort 80-4.5 MCG/ACT inhaler Generic drug: budesonide-formoterol INHALE 2 PUFFS INTO THE LUNGS TWICE DAILY What changed: when to take this   vitamin B-12 1000 MCG tablet Commonly known as: CYANOCOBALAMIN Take 1,000 mcg by mouth 3 (three) times a week. MWF   Vitamin D-3 125 MCG (5000 UT) Tabs Take 5,000 Units by mouth daily.   warfarin 2.5 MG tablet Commonly known as: COUMADIN Take 1/2-1 tablet by mouth daily as directed by [facility] What changed:   how much to take  how to take this  when to take this  additional instructions  zinc gluconate 50 MG tablet Take 50 mg by mouth daily.            Discharge Care Instructions  (From admission, onward)         Start     Ordered   02/17/20 0000  Discharge wound care:       Comments: As per general surgery recommendations.   02/17/20 1126          Follow-up Information    Donnie Mesa, MD Follow up on 03/14/2020.   Specialty: General Surgery Why: 9:10 am, please arrive by 8:40am for paperwork and check in process Contact information: [address]   [address] [phone]              Allergies  Allergen Reactions  . Doxycycline Other (See Comments)    Rash on face and neck  . Vicodin [Hydrocodone-Acetaminophen]     Hallucinations   . Flagyl [Metronidazole] Nausea And Vomiting  . Moxifloxacin Other (See Comments)      Weakness/fatigue  . Pantoprazole Sodium Rash  . Penicillins Rash    Has patient had a PCN reaction causing immediate rash, facial/tongue/throat swelling, SOB or lightheadedness with hypotension:unsure Has patient had a PCN reaction causing severe rash involving mucus membranes or skin necrosis:unsure Has patient had a PCN reaction that required hospitalization:No Has patient had a PCN reaction occurring within the last 10 years:Yes Cannot recall exact reaction due to time lapse If all of the above answers are "NO", then may proceed with Cephalosporin use.     Consultations:  General surgery.    Procedures/Studies: DG CHEST PORT 1 VIEW  Result Date: 02/08/2020 CLINICAL DATA:  86 year old female status post left-sided PICC placement. EXAM: PORTABLE CHEST 1 VIEW COMPARISON:  Chest radiograph dated 11/15/2019. FINDINGS: Left-sided PICC with tip in the left innominate vein approximately 2.2 cm proximal to the SVC. No pneumothorax. Bilateral upper lobe interstitial and reticular densities progressed since the prior radiograph. No pleural effusion. Stable cardiomegaly. Atherosclerotic calcification of the aorta. Degenerative changes of the spine. Old left humeral neck fracture. IMPRESSION: 1. Left-sided PICC with tip in the left innominate vein. No pneumothorax. 2. Bilateral upper lobe interstitial and reticular densities progressed since the prior radiograph. Electronically Signed   By: Anner Crete M.D.   On: 02/08/2020 20:33   DG Abd 2 Views  Result Date: 02/11/2020 CLINICAL DATA:  Abdominal pain post exploratory laparotomy, small-bowel resection, and lysis of adhesions EXAM: ABDOMEN - 2 VIEW COMPARISON:  Portable exam 1033 hours compared to CT abdomen and pelvis 02/08/2020 FINDINGS: Surgical drain projects over LEFT mid abdomen. Skin clips from prior midline laparotomy. Nonobstructive bowel gas pattern. Minimal residual contrast distal colon. No bowel dilatation or bowel wall thickening.  Bones demineralized with multilevel degenerative disc disease changes of the thoracolumbar spine. IMPRESSION: Nonobstructive bowel gas pattern. Electronically Signed   By: Lavonia Dana M.D.   On: 02/11/2020 12:13   CT Angio Abd/Pel W and/or Wo Contrast  Result Date: 02/08/2020 CLINICAL DATA:  86 year old female with abdominal pain and possible GI bleeding. EXAM: CTA ABDOMEN AND PELVIS WITHOUT AND WITH CONTRAST TECHNIQUE: Multidetector CT imaging of the abdomen and pelvis was performed using the standard protocol during bolus administration of intravenous contrast. Multiplanar reconstructed images and MIPs were obtained and reviewed to evaluate the vascular anatomy. CONTRAST:  128mL OMNIPAQUE IOHEXOL 350 MG/ML SOLN COMPARISON:  10/25/2011 FINDINGS: VASCULAR Aorta: Mild atherosclerotic changes of the thoracic aorta and mild to moderate atherosclerosis of the abdominal aorta.  No dissection. No periaortic fluid. Celiac: Celiac artery patent at the origin without high-grade stenosis. Mild atherosclerosis. Branches are patent and of relatively normal caliber. 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 SMA: The SMA is patent at the origin with minimal atherosclerosis. SMA tapers to very small caliber within the mesentery. There is no occlusion identified on the CT angiogram. Renals: - Right: Right renal artery patent with minimal atherosclerosis. Coronal images demonstrates some beading pattern of the right renal artery. - Left: Left renal artery patent with minimal atherosclerosis. Coronal images demonstrate questionable subtle beading pattern of the left renal artery. IMA: IMA is patent Right lower extremity: Unremarkable course, caliber, and contour of the right iliac system. Mild atherosclerosis. No aneurysm, dissection, or occlusion. Hypogastric artery is patent. Common femoral artery patent. Proximal SFA and profunda femoris patent. Left lower  extremity: Unremarkable course, caliber, and contour of the left iliac system. Mild atherosclerosis. No aneurysm, dissection, or occlusion. Hypogastric artery is patent. Common femoral artery patent. Proximal SFA and profunda femoris patent. Veins: Portal system: Portal venous system patent. No esophageal varices. No evidence of gastric varices. Splenic vein patent of small caliber. The SMV is patent at the confluence with the portal vein/splenic vein. Distal to the confluence, SMV and the SMV branches are completely decompressed and essentially non visible. Review of the MIP images confirms the above findings. NON-VASCULAR Lower chest: Emphysematous changes at the lung bases. Hepatobiliary: Unremarkable appearance of the liver. Unremarkable gall bladder. Pancreas: Unremarkable. Spleen: I few punctate calcifications within the splenic parenchyma possibly secondary to prior granulomatous disease. Adrenals/Urinary Tract: - Right adrenal gland: Unremarkable - Left adrenal gland: Unremarkable. - Right kidney: No hydronephrosis, nephrolithiasis, inflammation, or ureteral dilation. Small hypoechoic/nonenhancing lesions which are too small to characterize. - Left Kidney: No hydronephrosis, nephrolithiasis, inflammation, or ureteral dilation. Small hypoenhancing hypodense cystic lesions which are too small to characterize. - Urinary Bladder: Unremarkable. Stomach/Bowel: - Stomach: Hiatal hernia. Stomach is decompressed. No fluid fluid level. No accumulation of contrast to suggest extravasation. No focal wall thickening. Mucosa of the stomach enhances within normal limits. - Small bowel: Proximal small bowel decompressed with typical enhancement pattern expected within the small bowel on the venous phase. There is a long segment of small bowel within the mid and lower abdomen demonstrating borderline dilation, extensive edema within the mesentery, and non enhancement on the arterial and venous phases. Single loop within the  mid abdomen anterior to the sacral base on image 131 of series 17 demonstrate is differential enhancement, with enhancement proximally and non enhancement within the adjacent segment. Presumed transition point within the bowel on image 125 of series 17, right abdomen. There is no evidence of accumulation of high density fluid within small bowel that would suggest active extravasation. - Appendix: Appendix not visualized. - Colon: Terminal ileum at the IC valve is decompressed. Proximal colon appears to enhance within normal limits without focal inflammatory changes. Some retained high density material within the transverse colon. Diverticular disease without focal inflammatory changes or distention of the left colon and sigmoid colon. Lymphatic: No lymphadenopathy. Mesenteric: Trace fluid measuring 20 Hounsfield units adjacent to the spleen and the liver. Trace fluid within the pericolic gutter bilaterally. Extensive edema within the small bowel mesentery associated with a nonenhancing bowel loops. Trace free fluid within the anatomic pelvis. No free air. No portal venous gas. Reproductive: Changes of hysterectomy Other: Small fat containing umbilical hernia. Musculoskeletal: Surgical changes of right hip  arthroplasty with significant streak artifact limiting evaluation of the pelvis. Degenerative changes of the left hip. Degenerative changes of the visualized thoracolumbar spine. No acute displaced fracture. Osteopenia. IMPRESSION: Small bowel ischemia within the mid and lower abdomen, demonstrating edematous, nonenhancing pattern. This is favored to be secondary to mesenteric adhesions/torsion, and not from thromboembolic arterial disease. Surgical consultation is indicated. CT is negative for evidence of acute gastrointestinal hemorrhage. Small volume reactive ascites. These above preliminary results were discussed by telephone at the time of interpretation on 02/08/2020 at 2:17 pm with Dr. Herbie Baltimore LOCKWOOD Minimal  aortic atherosclerosis. Aortic Atherosclerosis (ICD10-I70.0). No significant mesenteric arterial disease. Subtle beading changes of the bilateral renal arteries, right greater than left, which may indicate fibromuscular dysplasia. 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 Additional ancillary findings as above. Signed, Dulcy Fanny. Dellia Nims, RPVI [facility] Electronically Signed   By: Corrie Mckusick D.O.   On: 02/08/2020 14:48   Subjective: No new complaints.   Discharge Exam: Vitals:   02/17/20 1053 02/17/20 1059  BP: 138/64   Pulse: 72   Resp: (!) 26 (!) 22  Temp: 98.1 F (36.7 C)   SpO2: 100%    Vitals:   02/17/20 0753 02/17/20 0823 02/17/20 1053 02/17/20 1059  BP: (!) 146/54  138/64   Pulse: 69  72   Resp: (!) 22  (!) 26 (!) 22  Temp: 98.1 F (36.7 C)  98.1 F (36.7 C)   TempSrc: Oral  Oral   SpO2: 98% 98% 100%   Weight:      Height:        General: Pt is alert, awake, not in acute distress Cardiovascular: irregularly irregular. S1/S2 +, no rubs, no gallops Respiratory: CTA bilaterally, no wheezing, no rhonchi Abdominal: Soft, NT, ND, bowel sounds + Extremities: no edema, no cyanosis    The results of significant diagnostics from this hospitalization (including imaging, microbiology, ancillary and laboratory) are listed below for reference.     Microbiology: Recent Results (from the past 240 hour(s))  SARS Coronavirus 2 by RT PCR (hospital order, performed in [facility] hospital lab) Nasopharyngeal Nasopharyngeal Swab     Status: None   Collection Time: 02/08/20  1:26 PM   Specimen: Nasopharyngeal Swab  Result Value Ref Range Status   SARS Coronavirus 2 NEGATIVE NEGATIVE Final    Comment: (NOTE) SARS-CoV-2 target nucleic acids are NOT DETECTED.  The SARS-CoV-2 RNA is generally detectable in upper and lower respiratory specimens during the acute phase of infection. The  lowest concentration of SARS-CoV-2 viral copies this assay can detect is 250 copies / mL. A negative result does not preclude SARS-CoV-2 infection and should not be used as the sole basis for treatment or other patient management decisions.  A negative result may occur with improper specimen collection / handling, submission of specimen other than nasopharyngeal swab, presence of viral mutation(s) within the areas targeted by this assay, and inadequate number of viral copies (<250 copies / mL). A negative result must be combined with clinical observations, patient history, and epidemiological information.  Fact Sheet for Patients:   StrictlyIdeas.no  Fact Sheet for Healthcare Providers: BankingDealers.co.za  This test is not yet approved or  cleared by the [place] FDA and has been authorized for detection and/or diagnosis of SARS-CoV-2 by FDA under an Emergency Use Authorization (EUA).  This EUA will remain in effect (meaning this test can be used) for the duration of the COVID-19  declaration under Section 564(b)(1) of the Act, 21 U.S.C. section 360bbb-3(b)(1), unless the authorization is terminated or revoked sooner.  Performed at [facility], [place] [address], [place], [place] [address]      Labs: BNP (last 3 results) No results for input(s): BNP in the last 8760 hours. Basic Metabolic Panel: Recent Labs  Lab 02/11/20 0527 02/11/20 1103 02/12/20 0400 02/13/20 0340 02/14/20 0545 02/15/20 0515  NA 134*  --  132* 132* 132* 133*  K 3.8  --  4.3 4.7 4.4 4.0  CL 101  --  99 98 97* 97*  CO2 27  --  28 28 28 27   GLUCOSE 110*  --  115* 114* 111* 108*  BUN <5*  --  <5* <5* <5* <5*  CREATININE 0.45  --  0.48 0.46 0.46 0.47  CALCIUM 8.1*  --  8.2* 8.4* 8.7* 8.8*  MG  --   --  1.7  --   --   --   PHOS  --  1.7* 3.3  --   --   --    Liver Function Tests: No results for input(s): AST, ALT, ALKPHOS, BILITOT, PROT,  ALBUMIN in the last 168 hours. No results for input(s): LIPASE, AMYLASE in the last 168 hours. No results for input(s): AMMONIA in the last 168 hours. CBC: Recent Labs  Lab 02/12/20 0400 02/14/20 0545 02/15/20 0515 02/16/20 0404 02/17/20 0833  WBC 7.9 9.0 9.7 7.2 8.5  HGB 9.1* 9.3* 9.5* 9.5* 9.6*  HCT 28.9* 29.8* 30.7* 30.4* 29.9*  MCV 93.5 92.3 91.1 92.7 90.9  PLT 310 353 392 387 399   Cardiac Enzymes: No results for input(s): CKTOTAL, CKMB, CKMBINDEX, TROPONINI in the last 168 hours. BNP: Invalid input(s): POCBNP CBG: No results for input(s): GLUCAP in the last 168 hours. D-Dimer No results for input(s): DDIMER in the last 72 hours. Hgb A1c No results for input(s): HGBA1C in the last 72 hours. Lipid Profile No results for input(s): CHOL, HDL, LDLCALC, TRIG, CHOLHDL, LDLDIRECT in the last 72 hours. Thyroid function studies No results for input(s): TSH, T4TOTAL, T3FREE, THYROIDAB in the last 72 hours.  Invalid input(s): FREET3 Anemia work up No results for input(s): VITAMINB12, FOLATE, FERRITIN, TIBC, IRON, RETICCTPCT in the last 72 hours. Urinalysis    Component Value Date/Time   COLORURINE STRAW (A) 11/14/2019 0210   APPEARANCEUR CLEAR 11/14/2019 0210   LABSPEC 1.006 11/14/2019 0210   PHURINE 7.0 11/14/2019 0210   GLUCOSEU NEGATIVE 11/14/2019 0210   HGBUR SMALL (A) 11/14/2019 0210   BILIRUBINUR NEGATIVE 11/14/2019 0210   BILIRUBINUR Neg 02/06/2012 1022   KETONESUR NEGATIVE 11/14/2019 0210   PROTEINUR NEGATIVE 11/14/2019 0210   UROBILINOGEN 0.2 04/07/2012 1443   NITRITE NEGATIVE 11/14/2019 0210   LEUKOCYTESUR NEGATIVE 11/14/2019 0210   Sepsis Labs Invalid input(s): PROCALCITONIN,  WBC,  LACTICIDVEN Microbiology Recent Results (from the past 240 hour(s))  SARS Coronavirus 2 by RT PCR (hospital order, performed in [facility] hospital lab) Nasopharyngeal Nasopharyngeal Swab     Status: None   Collection Time: 02/08/20  1:26 PM   Specimen: Nasopharyngeal Swab   Result Value Ref Range Status   SARS Coronavirus 2 NEGATIVE NEGATIVE Final    Comment: (NOTE) SARS-CoV-2 target nucleic acids are NOT DETECTED.  The SARS-CoV-2 RNA is generally detectable in upper and lower respiratory specimens during the acute phase of infection. The lowest concentration of SARS-CoV-2 viral copies this assay can detect is 250 copies / mL. A negative result does not preclude SARS-CoV-2  infection and should not be used as the sole basis for treatment or other patient management decisions.  A negative result may occur with improper specimen collection / handling, submission of specimen other than nasopharyngeal swab, presence of viral mutation(s) within the areas targeted by this assay, and inadequate number of viral copies (<250 copies / mL). A negative result must be combined with clinical observations, patient history, and epidemiological information.  Fact Sheet for Patients:   StrictlyIdeas.no  Fact Sheet for Healthcare Providers: BankingDealers.co.za  This test is not yet approved or  cleared by the [place] FDA and has been authorized for detection and/or diagnosis of SARS-CoV-2 by FDA under an Emergency Use Authorization (EUA).  This EUA will remain in effect (meaning this test can be used) for the duration of the COVID-19 declaration under Section 564(b)(1) of the Act, 21 U.S.C. section 360bbb-3(b)(1), unless the authorization is terminated or revoked sooner.  Performed at [facility], [place] [address] [place], [place] [address]      Time coordinating discharge: 35 minutes.  SIGNED:   Hosie Poisson, MD  [facility] 02/17/2020, 11:26 AM

## 2020-02-17 NOTE — IPOC Note (Signed)
Individualized overall Plan of Care [facility]) Patient Details Name: Stephanie Cortez MRN: [id number] DOB: 02/24/1934  Admitting Diagnosis: Debility  Hospital Problems: Principal Problem:   Debility Active Problems:   Sepsis ([place])   Subtherapeutic international normalized ratio (INR)     Functional Problem List: Nursing Pain, Bladder, Bowel  PT Balance, Safety, Edema, Skin Integrity, Endurance, Motor, Nutrition, Pain  OT Balance, Endurance, Motor  SLP    TR         Basic ADL's: OT Grooming, Bathing, Dressing, Toileting     Advanced  ADL's: OT       Transfers: PT Bed Mobility, Bed to Chair, Car, Patent attorney, Agricultural engineer: PT Ambulation, Emergency planning/management officer, Stairs     Additional Impairments: OT None  SLP        TR      Anticipated Outcomes Item Anticipated Outcome  Self Feeding    Swallowing      Basic self-care  Mod I/supervision  Toileting  mod I   Bathroom Transfers supervision  Bowel/Bladder  to be continent x 2  Transfers  mod I using LRAD  Locomotion  mod I using LRAD household 50 ft, supervision community >150 ft  Communication     Cognition     Pain  less than 3 out of 10 on pain scale  Safety/Judgment  pt will remain free from falls while in rehab   Therapy Plan: PT Intensity: Minimum of 1-2 x/day ,45 to 90 minutes PT Frequency: 5 out of 7 days PT Duration Estimated Length of Stay: 10-14 days OT Intensity: Minimum of 1-2 x/day, 45 to 90 minutes OT Frequency: 5 out of 7 days OT Duration/Estimated Length of Stay: 10-12 days      Team Interventions: Nursing Interventions Patient/Family Education, Skin Care/Wound Management, Discharge Planning, Pain Management, Bowel Management, Bladder Management  PT interventions Community reintegration, Ambulation/gait training, DME/adaptive equipment instruction, Neuromuscular re-education, Psychosocial support, Stair training, UE/LE Strength taining/ROM, Wheelchair  propulsion/positioning, Training and development officer, Discharge planning, Functional electrical stimulation, Pain management, Skin care/wound management, Therapeutic Activities, UE/LE Coordination activities, Disease management/prevention, Functional mobility training, Patient/family education, Splinting/orthotics, Therapeutic Exercise  OT Interventions Training and development officer, Community reintegration, Discharge planning, DME/adaptive equipment instruction, Pain management, Patient/family education, Self Care/advanced ADL retraining, Therapeutic Activities, Therapeutic Exercise, UE/LE Strength taining/ROM, UE/LE Coordination activities  SLP Interventions    TR Interventions    SW/CM Interventions Discharge Planning, Psychosocial Support, Patient/Family Education   Barriers to Discharge MD  Medical stability and Wound care  Nursing      PT Home environment access/layout, Lack of/limited family support 2-3 STE home, family can provide 24/7 supervision for limited time after d/c  OT      SLP      SW       Team Discharge Planning: Destination: PT-Home ,OT- Home , SLP-  Projected Follow-up: PT-Home health PT, OT-  Home health OT, SLP-  Projected Equipment Needs: PT-To be determined, OT- To be determined, SLP-  Equipment Details: PT-patient has RW and rental w/c at home, will determine other DME needs based on patient's progress, OT-  Patient/family involved in discharge planning: PT- Patient,  OT-Patient, SLP-   MD ELOS: 10-14 days. Medical Rehab Prognosis:  Good Assessment: 86 year old right-handed female with history of atrial fibrillation maintained on Coumadin, interstitial lung disease maintained on oxygen, anxiety, diastolic congestive heart failure, COPD, hypertension, chronic back pain, history of right hip fracture status post right hip hemiarthroplasty and received inpatient rehab services 10/30/2019 to 11/16/2019 ,  hyperlipidemia, colitis associate with diverticulitis/IBS.  She  presented on 02/08/2020 with abdominal pain, coffee-ground emesis as well as nausea and vomiting.  CT angiogram showed what appeared to be small bowel ischemia in the mid lower abdomen likely secondary to torsion of the mesentery.  She was given Kcentra to reverse INR of 3.3.  Admission chemistry sodium 132, glucose 175, WBC 18,500, hemoglobin 13.9, lactic acid 2.4.  Patient underwent exploratory laparotomy lysis of adhesions small bowel resection gastrostomy tube placement 02/08/2020 per Dr.Tsuei.  Surgical pathology hemorrhagic necrosis consistent with ischemia.  Working with clamping of G-tube and diet slowly advanced.  Chronic Coumadin has been resumed.  Acute blood loss anemia.    Patient with resulting functional deficits with mobility, transfers, endurance, self-care.  We will set goals for Mod I with PT/OT.   Due to the current state of emergency, patients may not be receiving their 3-hours of Medicare-mandated therapy.  See Team Conference Notes for weekly updates to the plan of care

## 2020-02-17 NOTE — Progress Notes (Signed)
Patient Details  Name: Stephanie Cortez MRN: [id number] Date of Birth: Feb 13, 1934  Today's Date: 02/17/2020  Hospital Problems: Active Problems:   Sepsis [facility])  Past Medical History:  Past Medical History:  Diagnosis Date  . Anemia    - Hgb 9.7gm% on 07/13/2008 in [place] -  Hgg 129gm% wiht normal irone levsl and ferritin 10/27/2008 in [place] Recurrent otitis/sinusitis  . Anxiety    chronic BZ prn  . Atrial fibrillation (HCC)    chronic anticoag  . Bronchiectasis    >PFT 07/13/2008 in [place] 1.9L/76%, FVC 2.45L/74, Ratio 79, TLC 121%, DLCO 64%  AE BRonchiectasis - Dec 2010.New Rx:  outpatient - Feb 2011 - Rx outpatient  . CHF (congestive heart failure) ([place])   . COPD (chronic obstructive pulmonary disease) (HCC)    bronchiectasis  . Cricopharyngeal achalasia   . Depression   . Diverticulosis   . Dyslipidemia   . Eczema   . GERD with stricture   . Glaucoma   . H. pylori infection   . Hypertension   . Hyponatremia    chronic, s/p endo eval 06/2012  . Hypothyroid   . IBS (irritable bowel syndrome)   . Lumbar disc disease   . Neuropathy of both feet   . Segmental colitis ([place])   . Vertigo   . Wears glasses    Past Surgical History:  Past Surgical History:  Procedure Laterality Date  . BOWEL RESECTION N/A 02/08/2020   Procedure: SMALL BOWEL RESECTION;  Surgeon: Donnie Mesa, MD;  Location: [place];  Service: General;  Laterality: N/A;  . BREAST SURGERY     br bx  . CARDIAC CATHETERIZATION  07/01/2013  . CATARACT EXTRACTION     both  . COLONOSCOPY    . ESOPHAGOSCOPY W/ BOTOX INJECTION  12/11/2011   Procedure: ESOPHAGOSCOPY WITH BOTOX INJECTION;  Surgeon: Rozetta Nunnery, MD;  Location: [place];  Service: ENT;  Laterality: N/A;  esophogoscopy with dilation, botox injection  . FOOT SURGERY  03/14/2011   gastroc slide-rt  . GASTROSTOMY N/A 02/08/2020   Procedure: INSERTION OF GASTROSTOMY TUBE;  Surgeon: Donnie Mesa, MD;  Location: [place];  Service:  General;  Laterality: N/A;  . HIP ARTHROPLASTY Right 10/28/2019   Procedure: ARTHROPLASTY BIPOLAR HIP (HEMIARTHROPLASTY);  Surgeon: Marybelle Killings, MD;  Location: [facility];  Service: Orthopedics;  Laterality: Right;  . LAPAROTOMY N/A 02/08/2020   Procedure: EXPLORATORY LAPAROTOMY;  Surgeon: Donnie Mesa, MD;  Location: [place];  Service: General;  Laterality: N/A;  . LYSIS OF ADHESION N/A 02/08/2020   Procedure: LYSIS OF ADHESIONS;  Surgeon: Donnie Mesa, MD;  Location: [place];  Service: General;  Laterality: N/A;  . TOTAL ABDOMINAL HYSTERECTOMY    . TOTAL HIP REVISION Right 11/16/2019   Procedure: TOTAL HIP REVISION BIPOLAR TO CEMENTED BIPOLAR;  Surgeon: Marybelle Killings, MD;  Location: [place];  Service: Orthopedics;  Laterality: Right;  . WISDOM TOOTH EXTRACTION     Social History:  reports that she has never smoked. She has never used smokeless tobacco. She reports that she does not drink alcohol and does not use drugs.  Family / Support Systems Marital Status: Widow/Widower Patient Roles: Parent, Other (Comment) (church member) Children: [name] [phone] Other Supports: Son in-law Anticipated Caregiver: Daughter and her family. Pt was doing well at home prior to admission to the hospital. Living alone and managing Ability/Limitations of Caregiver: No limitatons-daughter retired Careers adviser: 24/7 Family Dynamics: Close knit with daughter and son in-law  who live close by. very involved and will assist at DC if needed. Pt recently Dc from [place] after THR  Social History Preferred language: English Religion: [employer] Cultural Background: No issues Education: HS Read: Yes Write: Yes Employment Status: Retired Public relations account executive Issues: No issues Guardian/Conservator: None-according to MD pt is capable of making her own decisions   Abuse/Neglect Abuse/Neglect Assessment Can Be Completed: Yes Physical Abuse: Denies Verbal Abuse: Denies Sexual Abuse:  Denies Exploitation of patient/patient's resources: Denies Self-Neglect: Denies  Emotional Status Pt's affect, behavior and adjustment status: Pt is motivated to do well and was recently here in April-May after her THR. She is glad to be back but her stomach issues were so unexpected. Recent Psychosocial Issues: other recent surgeries-B-THR's Psychiatric History: No issues Substance Abuse History: None  Patient / Family Perceptions, Expectations & Goals Pt/Family understanding of illness & functional limitations: Pt and daughter are able to explain her surgery and issues with her stomach. She is glad this is over and ready to heal and get her strength back. That is the reason she is here on CIR again Premorbid pt/family roles/activities: Mom, grandmother, retiree, church member Anticipated changes in roles/activities/participation: resume Pt/family expectations/goals: Pt states: " I hope to get stronger while here and be back home soon."  Daughter states: " I am glad she could come back there, she did so well before."  [employer]: None Premorbid Home Care/DME Agencies: Other (Comment) [facility] active with PT and OT) Transportation available at discharge: Family  Discharge Planning Living Arrangements: Alone Support Systems: Children, Other relatives, Friends/neighbors, Social worker community Type of Residence: Private residence Insurance Resources: [employer], Multimedia programmer (specify) Sports administrator) Financial Resources: Radio broadcast assistant Screen Referred: No Living Expenses: Own Money Management: Patient Does the patient have any problems obtaining your medications?: No Home Management: Self, daughter has been helping since her [facility] Patient/Family Preliminary Plans: Return home with daughter assisting if needed. Daughter lives 10 minutes from her. If needed daughter can stay with, but will await therapy evaluations. Familiar with CIR was recently here. Care  Coordinator Anticipated Follow Up Needs: HH/OP  Clinical Impression Pleasant female who is motivated to do well and regain her independence. She wants to be able to return home alone, daughter very involved and supportive and will assist if needed. Will await team evaluations and work on discharge needs.  Elease Hashimoto 02/17/2020, 3:55 PM

## 2020-02-17 NOTE — Progress Notes (Signed)
[place] for warfarin Indication: atrial fibrillation  Allergies  Allergen Reactions   Doxycycline Other (See Comments)    Rash on face and neck   Vicodin [Hydrocodone-Acetaminophen]     Hallucinations    Flagyl [Metronidazole] Nausea And Vomiting   Moxifloxacin Other (See Comments)     Weakness/fatigue   Pantoprazole Sodium Rash   Penicillins Rash    Has patient had a PCN reaction causing immediate rash, facial/tongue/throat swelling, SOB or lightheadedness with hypotension:unsure Has patient had a PCN reaction causing severe rash involving mucus membranes or skin necrosis:unsure Has patient had a PCN reaction that required hospitalization:No Has patient had a PCN reaction occurring within the last 10 years:Yes Cannot recall exact reaction due to time lapse If all of the above answers are "NO", then may proceed with Cephalosporin use.     Patient Measurements: Height: 5\' 6"  (167.6 cm) Weight: 71.4 kg (157 lb 4.8 oz) IBW/kg (Calculated) : 59.3  Vital Signs: Temp: 98.1 F (36.7 C) (08/18 1053) Temp Source: Oral (08/18 1053) BP: 138/64 (08/18 1053) Pulse Rate: 72 (08/18 1053)  Labs: Recent Labs    02/15/20 0515 02/15/20 0515 02/16/20 0404 02/17/20 0833  HGB 9.5*   < > 9.5* 9.6*  HCT 30.7*  --  30.4* 29.9*  PLT 392  --  387 399  LABPROT 17.3*  --  17.2* 16.8*  INR 1.5*  --  1.5* 1.4*  CREATININE 0.47  --   --   --    < > = values in this interval not displayed.    Estimated Creatinine Clearance: 53 mL/min (by C-G formula based on SCr of 0.47 mg/dL).   Medical History: Past Medical History:  Diagnosis Date   Anemia    - Hgb 9.7gm% on 07/13/2008 in [place] -  Hgg 129gm% wiht normal irone levsl and ferritin 10/27/2008 in [place] Recurrent otitis/sinusitis   Anxiety    chronic BZ prn   Atrial fibrillation (HCC)    chronic anticoag   Bronchiectasis    >PFT 07/13/2008 in [place] 1.9L/76%, FVC 2.45L/74, Ratio 79,  TLC 121%, DLCO 64%  AE BRonchiectasis - Dec 2010.New Rx:  outpatient - Feb 2011 - Rx outpatient   CHF (congestive heart failure) (HCC)    COPD (chronic obstructive pulmonary disease) (HCC)    bronchiectasis   Cricopharyngeal achalasia    Depression    Diverticulosis    Dyslipidemia    Eczema    GERD with stricture    Glaucoma    H. 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HCC)    Vertigo    Wears glasses     Medications:  Scheduled:   Chlorhexidine Gluconate Cloth  6 each Topical Daily   diltiazem  60 mg Oral Q6H   docusate sodium  100 mg Oral BID   enoxaparin (LOVENOX) injection  40 mg Subcutaneous Q24H   feeding supplement  1 Container Oral TID BM   FLUoxetine  10 mg Oral QHS   levothyroxine  50 mcg Oral Q0600   mometasone-formoterol  2 puff Inhalation BID   polyethylene glycol  17 g Oral Daily   sodium chloride flush  10-40 mL Intracatheter Q12H   Warfarin - Pharmacist Dosing Inpatient   Does not apply [id number]  Infusions:   sodium chloride Stopped (02/10/20 1851)   famotidine (PEPCID) IV 20 mg (02/16/20 1800)   lactated ringers     methocarbamol (ROBAXIN) IV Stopped (02/10/20 2258)   PRN: acetaminophen **OR** acetaminophen, diphenhydrAMINE, guaiFENesin-dextromethorphan, melatonin, methocarbamol (ROBAXIN) IV, morphine injection, ondansetron **OR** ondansetron (ZOFRAN) IV, sodium chloride flush Anti-infectives (From admission, onward)   Start     Dose/Rate Route Frequency Ordered Stop   02/08/20 1700  meropenem (MERREM) 1 g in sodium chloride 0.9 % 100 mL IVPB  Status:  Discontinued        1 g 200 mL/hr over 30 Minutes Intravenous Every 8 hours 02/08/20 1639 02/12/20 0959   02/08/20 1530  cefTRIAXone (ROCEPHIN) 2 g in sodium chloride 0.9 % 100 mL IVPB  Status:  Discontinued         2 g 200 mL/hr over 30 Minutes Intravenous Every 24 hours 02/08/20 1502 02/08/20 1639   02/08/20 1515  cefTRIAXone (ROCEPHIN) 2 g in sodium chloride 0.9 % 100 mL IVPB  Status:  Discontinued        2 g 200 mL/hr over 30 Minutes Intravenous Every 24 hours 02/08/20 1508 02/08/20 1512   02/08/20 1500  piperacillin-tazobactam (ZOSYN) IVPB 2.25 g  Status:  Discontinued        2.25 g 100 mL/hr over 30 Minutes Intravenous  Once 02/08/20 1453 02/08/20 1502      Assessment: 86 yo female with a history of permanent atrial fibrillation presents with abdominal pain and coffee-ground emesis. The patient was found to have small bowel ischemia and underwent exploratory laparotomy, lysis of adhesions, small bowel resection and gastrostomy tube placementon 02/08/20. The patient's INR upon admit was supratherapeutic at 3.3 and the patient received KCentra 1625 units once prior to surgery.  She is on warfarin PTA for afib and pharmacy consulted to restart on 8/15.  INR is subtherapeutic at 1.4. Hgb 9.6, plt 399. No s/sx of bleeding.   Goal of Therapy:  INR 2-3 Monitor platelets by anticoagulation protocol: Yes   Plan:  Warfarin 1.25 mg PO tonight Enoxaparin 40 mg SubQ q24h until INR is therapeutic  Obtain daily PT/INR Will change CBC to MWF only  Stephanie Cortez, PharmD, [place] Pharmacist  Phone: [phone] 02/17/2020 11:13 AM  Please check AMION for all [place] phone numbers After 10:00 PM, call [place] [phone]

## 2020-02-17 NOTE — Consult Note (Signed)
   [facility] CM Inpatient Consult   02/17/2020  Stephanie Cortez [birth year] [id number]   Follow Up:  Medicare NextGen pending community follow up  Patient is admitting to CIR.  Will alert [place] Coordinator of this transition.  For questions,  Natividad Brood, RN BSN [facility] Liaison  [phone] business mobile phone Toll free office [phone]  Fax number: [phone] [email or web address]

## 2020-02-17 NOTE — Progress Notes (Signed)
[facility] Progress Note  9 Days Post-Op  Subjective: CC-  Did not sleep well last night, otherwise doing well. Tolerating soft diet. Continues to pass flatus and did have a BM yesterday. Denies n/v. Waiting for CIR.  Objective: Vital signs in last 24 hours: Temp:  [97.8 F (36.6 C)-98.1 F (36.7 C)] 98.1 F (36.7 C) (08/18 0753) Pulse Rate:  [66-79] 69 (08/18 0753) Resp:  [20-22] 22 (08/18 0753) BP: (125-146)/(51-63) 146/54 (08/18 0753) SpO2:  [97 %-99 %] 98 % (08/18 0753) Weight:  [71.4 kg] 71.4 kg (08/18 0449) Last BM Date: 02/09/20  Intake/Output from previous day: 08/17 0701 - 08/18 0700 In: -  Out: 1000 [Urine:1000] Intake/Output this shift: No intake/output data recorded.  PE: Gen:  Alert, NAD, pleasant Pulm:  rate and effort normal Abd: Soft, NT/ND, +BS, midline incision cdi with staples intact and no erythema or drainage, G tube clamped/ site cdi  Lab Results:  Recent Labs    02/15/20 0515 02/16/20 0404  WBC 9.7 7.2  HGB 9.5* 9.5*  HCT 30.7* 30.4*  PLT 392 387   BMET Recent Labs    02/15/20 0515  NA 133*  K 4.0  CL 97*  CO2 27  GLUCOSE 108*  BUN <5*  CREATININE 0.47  CALCIUM 8.8*   PT/INR Recent Labs    02/15/20 0515 02/16/20 0404  LABPROT 17.3* 17.2*  INR 1.5* 1.5*   CMP     Component Value Date/Time   NA 133 (L) 02/15/2020 0515   NA 130 (L) 02/16/2019 1452   K 4.0 02/15/2020 0515   CL 97 (L) 02/15/2020 0515   CO2 27 02/15/2020 0515   GLUCOSE 108 (H) 02/15/2020 0515   BUN <5 (L) 02/15/2020 0515   BUN 13 02/16/2019 1452   CREATININE 0.47 02/15/2020 0515   CALCIUM 8.8 (L) 02/15/2020 0515   PROT 4.6 (L) 02/10/2020 0453   ALBUMIN 2.2 (L) 02/10/2020 0453   AST 15 02/10/2020 0453   ALT 9 02/10/2020 0453   ALKPHOS 58 02/10/2020 0453   BILITOT 0.4 02/10/2020 0453   GFRNONAA >60 02/15/2020 0515   GFRAA >60 02/15/2020 0515   Lipase     Component Value Date/Time   LIPASE 29.0 10/08/2011 1046        Studies/Results: No results found.  Anti-infectives: Anti-infectives (From admission, onward)   Start     Dose/Rate Route Frequency Ordered Stop   02/08/20 1700  meropenem (MERREM) 1 g in sodium chloride 0.9 % 100 mL IVPB  Status:  Discontinued        1 g 200 mL/hr over 30 Minutes Intravenous Every 8 hours 02/08/20 1639 02/12/20 0959   02/08/20 1530  cefTRIAXone (ROCEPHIN) 2 g in sodium chloride 0.9 % 100 mL IVPB  Status:  Discontinued        2 g 200 mL/hr over 30 Minutes Intravenous Every 24 hours 02/08/20 1502 02/08/20 1639   02/08/20 1515  cefTRIAXone (ROCEPHIN) 2 g in sodium chloride 0.9 % 100 mL IVPB  Status:  Discontinued        2 g 200 mL/hr over 30 Minutes Intravenous Every 24 hours 02/08/20 1508 02/08/20 1512   02/08/20 1500  piperacillin-tazobactam (ZOSYN) IVPB 2.25 g  Status:  Discontinued        2.25 g 100 mL/hr over 30 Minutes Intravenous  Once 02/08/20 1453 02/08/20 1502       Assessment/Plan A fib on coumadin- coumadin on hold, INR 1.5 (8/17) Interstitial lung disease onnighttime oxygen HTN HLD  Hypothyroidism CHF (EF 60-65% 10/2019) Malnutrition - prealbumin 9.3 (8/13) ABL - hgb9.5 (8/17), stable  Peritonitis, ischemic bowel, previous achalasia -S/pExploratory laparotomy, lysis of adhesions, small bowel resection, gastrostomy tube placement8/9 Dr. Georgette Dover -POD#9 - surgical path:Hemorrhagic necrosis consistent with ischemia - G tube clamped, tolerating soft diet, having bowel function - surgically stable for CIR when bed available - follow up arranged with Dr. Georgette Dover in 3 weeks - will need staples removed in CIR on Friday 8/20  ID -merrem 8/9>>8/13 FEN -clamp G, soft diet, Boost VTE -SCDs,coumadin Foley -none Follow up -Dr. Georgette Dover   LOS: 9 days    Wellington [place], [facility] 02/17/2020, 8:02 AM Please see Amion for pager number during day hours 7:00am-4:30pm

## 2020-02-17 NOTE — Progress Notes (Signed)
Inpatient Rehabilitation Medication Review by a Pharmacist  A complete drug regimen review was completed for this patient to identify any potential clinically significant medication issues.  Clinically significant medication issues were identified:  yes   Type of Medication Issue Identified Description of Issue Urgent (address now) Non-Urgent (address on AM team rounds) Plan Plan Accepted by Provider? (Yes / No / Pending AM Rounds)  Drug Interaction(s) (clinically significant)       Duplicate Therapy       Allergy       No Medication Administration End Date       Incorrect Dose       Additional Drug Therapy Needed  Patient was on pravastatin and methocarbamol prior to admission. Per discharge summary to rehab patient should continue taking both medications.  Non-urgent Discuss with rounding team to determine if both medications need to be restarted Pending AM Rounds  Other         For non-urgent medication issues to be resolved on team rounds tomorrow morning a [facility] Secure Chat Handoff was sent to: Cathlyn Parsons PA-C   Pharmacist comments: N/a   Time spent performing this drug regimen review (minutes):  [place], PharmD 02/17/2020 4:08 PM

## 2020-02-17 NOTE — Progress Notes (Signed)
Jamse Arn, MD  Physician  Physical Medicine and Rehabilitation  PMR Pre-admission     Signed  Date of Service:  02/15/2020  1:48 PM      Related encounter: ED to Hosp-Admission (Discharged) from 02/08/2020 in [facility]      Signed       Show:Clear all [x] Manual[x] Template[x] Copied  Added by: [x] Cristina Gong, RN[x] Jamse Arn, MD  [] Hover for details PMR Admission Coordinator Pre-Admission Assessment   Patient: Stephanie Cortez is an 86 y.o., female MRN: [id number] DOB: August 22, 1933 Height: 5' 6"  (167.6 cm) Weight: 71.4 kg   Insurance Information HMO:     PPO:      PCP:      IPA:      80/20:      OTHER:  PRIMARY: Medicare a and b      Policy#: [id number]      Subscriber: pt Benefits:  Phone #: passport one online     Name: 8/12 Eff. Date: a 07/02/2000 and b 07/02/2009     Deduct: $1484      Out of Pocket Max: none      Life Max: none CIR: 100%      SNF: 20 full days Outpatient: 80%     Co-Pay: 20% Home Health: 1005      Co-Pay: none DME: 80%     Co-Pay: 20% Providers: pt choice  SECONDARY: [place] [facility] /state of [place]      Policy#: [id number]   Financial Counselor:       Phone#:    The "Data Collection Information Summary" for patients in Inpatient Rehabilitation Facilities with attached "Privacy Act [place] Records" was provided and verbally reviewed with: Patient and Family   Emergency Contact Information         Contact Information     Name Relation Home Work Mobile    Dolliver Daughter [phone]   [phone]    [name] [phone]        Jeania, Nater Spouse [phone]             Current Medical History  Patient Admitting Diagnosis: Debility   History of Present Illness: . Malphrus is an 86 year old right-handed female with history of atrial fibrillation maintained on Coumadin, interstitial lung disease maintained on oxygen, anxiety, diastolic congestive heart failure, COPD,  hypertension, chronic back pain, history of right hip fracture status post right hip hemiarthroplasty and received inpatient rehab services 10/30/2019 to 11/16/2019 , hyperlipidemia, colitis associate with diverticulitis/IBS.   Presented 02/08/2020 with abdominal pain, coffee-ground emesis as well as nausea and vomiting.  CT angiogram showed what appeared to be small bowel ischemia in the mid lower abdomen likely secondary to torsion of the mesentery.  She was given Kcentra to reverse INR of 3.3.  Admission chemistry sodium 132, glucose 175, WBC 18,500, hemoglobin 13.9, lactic acid 2.4.  Patient underwent exploratory laparotomy lysis of adhesions small bowel resection gastrostomy tube placement 02/08/2020 per Dr.Tsuei.  Surgical pathology hemorrhagic necrosis consistent with ischemia.  Working with clamping of G-tube and diet slowly advanced.  Chronic Coumadin has been resumed.  Acute blood loss anemia 9.6 and latest INR of 1.4.    Patient's medical record from [facility] has been reviewed by the rehabilitation admission coordinator and physician.   Past Medical History      Past Medical History:  Diagnosis Date  . Anemia      - Hgb 9.7gm% on 07/13/2008 in [place] -  Hgg 129gm% wiht  normal irone levsl and ferritin 10/27/2008 in [place] Recurrent otitis/sinusitis  . Anxiety      chronic BZ prn  . Atrial fibrillation (HCC)      chronic anticoag  . Bronchiectasis      >PFT 07/13/2008 in [place] 1.9L/76%, FVC 2.45L/74, Ratio 79, TLC 121%, DLCO 64%  AE BRonchiectasis - Dec 2010.New Rx:  outpatient - Feb 2011 - Rx outpatient  . CHF (congestive heart failure) ([place])    . COPD (chronic obstructive pulmonary disease) (HCC)      bronchiectasis  . Cricopharyngeal achalasia    . Depression    . Diverticulosis    . Dyslipidemia    . Eczema    . GERD with stricture    . Glaucoma    . H. pylori infection    . Hypertension    . Hyponatremia      chronic, s/p endo eval 06/2012  . Hypothyroid    . IBS  (irritable bowel syndrome)    . Lumbar disc disease    . Neuropathy of both feet    . Segmental colitis ([place])    . Vertigo    . Wears glasses        Family History   family history includes Atrial fibrillation in an other family member; Cancer in her father; Colon polyps in her sister; Emphysema in her brother; Hypertension in her mother; Kidney disease in her sister; Other in her father; Pancreatic cancer in her sister; Stroke in her mother.   Prior Rehab/Hospitalizations Has the patient had prior rehab or hospitalizations prior to admission? Yes CIR 10/2019 then returned to acute with complications. Did not choose to return to CIR. Went to [facility] and then returned home with daughter providing 24/7 supervision until a few days prior to admit.   Has the patient had major surgery during 100 days prior to admission? Yes              Current Medications   Current Facility-Administered Medications:  .  0.9 %  sodium chloride infusion, , Intravenous, Continuous, Kayleen Memos, DO, Stopped at 02/10/20 1851 .  acetaminophen (TYLENOL) tablet 650 mg, 650 mg, Oral, Q6H PRN, 650 mg at 02/17/20 2549 **OR** acetaminophen (TYLENOL) suppository 650 mg, 650 mg, Rectal, Q6H PRN, Donnie Mesa, MD .  Chlorhexidine Gluconate Cloth 2 % PADS 6 each, 6 each, Topical, Daily, Darliss Cheney, MD, 6 each at 02/15/20 1000 .  diltiazem (CARDIZEM) tablet 60 mg, 60 mg, Oral, Q6H, Hosie Poisson, MD, 60 mg at 02/17/20 0536 .  diphenhydrAMINE (BENADRYL) capsule 25 mg, 25 mg, Oral, QHS PRN, Shalhoub, Sherryll Burger, MD, 25 mg at 02/17/20 0211 .  docusate sodium (COLACE) capsule 100 mg, 100 mg, Oral, BID, Meuth, Brooke A, PA-C, 100 mg at 02/17/20 1020 .  enoxaparin (LOVENOX) injection 40 mg, 40 mg, Subcutaneous, Q24H, Hall, Carole N, DO, 40 mg at 02/16/20 1753 .  famotidine (PEPCID) IVPB 20 mg premix, 20 mg, Intravenous, Q24H, Donnie Mesa, MD, Last Rate: 100 mL/hr at 02/16/20 1800, 20 mg at 02/16/20 1800 .  feeding  supplement (BOOST / RESOURCE BREEZE) liquid 1 Container, 1 Container, Oral, TID BM, Meuth, Brooke A, PA-C, 1 Container at 02/17/20 1020 .  FLUoxetine (PROZAC) capsule 10 mg, 10 mg, Oral, QHS, Akula, Vijaya, MD, 10 mg at 02/16/20 2213 .  guaiFENesin-dextromethorphan (ROBITUSSIN DM) 100-10 MG/5ML syrup 5 mL, 5 mL, Oral, Q4H PRN, Hosie Poisson, MD, 5 mL at 02/14/20 2204 .  lactated ringers infusion, , Intravenous,  Continuous, Donnie Mesa, MD .  levothyroxine (SYNTHROID) tablet 50 mcg, 50 mcg, Oral, Q0600, Kris Mouton, RPH, 50 mcg at 02/17/20 0536 .  melatonin tablet 10 mg, 10 mg, Oral, QHS PRN, Shalhoub, Sherryll Burger, MD, 10 mg at 02/16/20 2329 .  methocarbamol (ROBAXIN) 500 mg in dextrose 5 % 50 mL IVPB, 500 mg, Intravenous, Q8H PRN, Rolm Bookbinder, MD, Stopped at 02/10/20 2258 .  mometasone-formoterol (DULERA) 100-5 MCG/ACT inhaler 2 puff, 2 puff, Inhalation, BID, Donnie Mesa, MD, 2 puff at 02/17/20 [id number] .  morphine 2 MG/ML injection 2-4 mg, 2-4 mg, Intravenous, Q4H PRN, Shela Leff, MD, 2 mg at 02/14/20 0157 .  ondansetron (ZOFRAN) tablet 4 mg, 4 mg, Oral, Q6H PRN **OR** ondansetron (ZOFRAN) injection 4 mg, 4 mg, Intravenous, Q6H PRN, Donnie Mesa, MD .  polyethylene glycol (MIRALAX / GLYCOLAX) packet 17 g, 17 g, Oral, Daily, Meuth, Brooke A, PA-C, 17 g at 02/17/20 1020 .  sodium chloride flush (NS) 0.9 % injection 10-40 mL, 10-40 mL, Intracatheter, Q12H, Hosie Poisson, MD, 10 mL at 02/17/20 1021 .  sodium chloride flush (NS) 0.9 % injection 10-40 mL, 10-40 mL, Intracatheter, PRN, Hosie Poisson, MD .  warfarin (COUMADIN) tablet 1.25 mg, 1.25 mg, Oral, ONCE-1600, Hosie Poisson, MD .  Warfarin - Pharmacist Dosing Inpatient, , Does not apply, q1600, Darlina Sicilian, [facility], Given at 02/14/20 1646   Patients Current Diet:     Diet Order                      Diet - low sodium heart healthy              DIET SOFT Room service appropriate? Yes; Fluid consistency: Thin  Diet  effective now                      Precautions / Restrictions Precautions Precautions: Fall Precaution Comments: gastric drainage bag/abdominal incisions, recent hx of R posterior THA WBAT, posterior lean Restrictions Weight Bearing Restrictions: No    Has the patient had 2 or more falls or a fall with injury in the past year? Yes   Prior Activity Level Limited Community (1-2x/wk): Mod I with RW   Prior Functional Level Self Care: Did the patient need help bathing, dressing, using the toilet or eating? Needed some help   Indoor Mobility: Did the patient need assistance with walking from room to room (with or without device)? Independent   Stairs: Did the patient need assistance with internal or external stairs (with or without device)? Needed some help   Functional Cognition: Did the patient need help planning regular tasks such as shopping or remembering to take medications? [place] / Equipment Home Assistive Devices/Equipment: Environmental consultant (specify type), Wheelchair Home Equipment: Grab bars - tub/shower, Shower seat - built in, Environmental consultant - 2 wheels, Wheelchair - manual   Prior Device Use: Indicate devices/aids used by the patient prior to current illness, exacerbation or injury? Walker   Current Functional Level Cognition   Overall Cognitive Status: Within Functional Limits for tasks assessed Orientation Level: Oriented X4 General Comments: very pleasant and cooperative, lots of experience in rehab and reports she wants to work hard to get better    Extremity Assessment (includes Sensation/Coordination)   Upper Extremity Assessment: Defer to OT evaluation  Lower Extremity Assessment: Generalized weakness     ADLs         Mobility   Overal bed mobility: Needs Assistance Bed  Mobility: Rolling Rolling: Mod assist Sidelying to sit: Mod assist, +2 for physical assistance Supine to sit: Mod assist, +2 for physical assistance, HOB elevated General  bed mobility comments: mod assist to roll, assist to get LES off bed and for elevation of trunk.      Transfers   Overall transfer level: Needs assistance Equipment used: Rolling walker (2 wheeled) Transfers: Sit to/from Stand, [employer] Transfers Sit to Stand: Min assist Stand pivot transfers: Min assist General transfer comment: min assist to power up.  Cues for hand placement. Steadyng assist needed but did obtain balance once up.      Ambulation / Gait / Stairs / Wheelchair Mobility   Ambulation/Gait Ambulation/Gait assistance: Herbalist (Feet): 35 Feet Assistive device: Rolling walker (2 wheeled) Gait Pattern/deviations: Step-through pattern, Decreased weight shift to left, Decreased stance time - left, Trunk flexed General Gait Details: min A for steadying with gait, decreased weightshift due to L hip pain from prior L THA Gait velocity: slowed Gait velocity interpretation: <1.31 ft/sec, indicative of household ambulator     Posture / Balance Dynamic Sitting Balance Sitting balance - Comments: initial posterior lean with min A for steadying able to progress to independent   Balance Overall balance assessment: Needs assistance Sitting-balance support: Feet supported, Bilateral upper extremity supported Sitting balance-Leahy Scale: Fair Sitting balance - Comments: initial posterior lean with min A for steadying able to progress to independent   Postural control: Posterior lean Standing balance support: Bilateral upper extremity supported, During functional activity Standing balance-Leahy Scale: Poor Standing balance comment: requires B UE support on UEs and external support for balance     Special needs/care consideration Designated visitor on admit, [name] 18 Fr G tube LUQ Abdominal incision    Previous Home Environment  Living Arrangements: Alone  Lives With: Alone (daughter had just in past week began not to stay with patien) Available Help at Discharge:  Family, Available 24 hours/day Type of Home: House Home Layout: One level Home Access: Stairs to enter Entrance Stairs-Rails: Can reach both Entrance Stairs-Number of Steps: 2 front, 3 in back Bathroom Shower/Tub: Walk-in shower, Chiropodist: Handicapped height Bathroom Accessibility: Yes How Accessible: Accessible via walker Home Care Services: No Additional Comments: has not been using walkin shower bc she can't get to it, has been using tub bench instead in regualr bathroom   Discharge Living Setting Plans for Discharge Living Setting: Patient's home, Alone Type of Home at Discharge: House Discharge Home Layout: One level Discharge Home Access: Stairs to enter Entrance Stairs-Rails: Right, Left, Can reach both Entrance Stairs-Number of Steps: 2 in front, 3 in back Discharge Bathroom Shower/Tub: Tub/shower unit, Walk-in shower Discharge Bathroom Toilet: Handicapped height Discharge Bathroom Accessibility: Yes How Accessible: Accessible via walker Does the patient have any problems obtaining your medications?: No   Social/Family/Support Systems Patient Roles: Parent Contact Information: daughter, [name] Anticipated Caregiver: daughter and family Anticipated Caregiver's Contact Information: see above Ability/Limitations of Caregiver: no limitations Caregiver Availability: 24/7 Discharge Plan Discussed with Primary Caregiver: Yes Is Caregiver In Agreement with Plan?: Yes Does Caregiver/Family have Issues with Lodging/Transportation while Pt is in Rehab?: No   Goals Patient/Family Goal for Rehab: Mod I to superivsion with PT and OT Expected length of stay: ELOS 10 to 12 days Additional Information: Recent CIR admit, returned to acute and then went to [facility] Pt/Family Agrees to Admission and willing to participate: Yes Program Orientation Provided & Reviewed with Pt/Caregiver Including Roles  & Responsibilities: Yes  Decrease burden of Care through IP  rehab admission: n/a   Possible need for SNF placement upon discharge: not anticipated   Patient Condition: I have reviewed medical records from [facility], spoken with CM, and patient, daughter and family member. I met with patient at the bedside for inpatient rehabilitation assessment.  Patient will benefit from ongoing PT and OT, can actively participate in 3 hours of therapy a day 5 days of the week, and can make measurable gains during the admission.  Patient will also benefit from the coordinated team approach during an Inpatient Acute Rehabilitation admission.  The patient will receive intensive therapy as well as Rehabilitation physician, nursing, social worker, and care management interventions.  Due to bladder management, bowel management, safety, skin/wound care, disease management, medication administration, pain management and patient education the patient requires 24 hour a day rehabilitation nursing.  The patient is currently min to mod assist overall with mobility and basic ADLs.  Discharge setting and therapy post discharge at home with home health is anticipated.  Patient has agreed to participate in the Acute Inpatient Rehabilitation Program and will admit today.   Preadmission Screen Completed By:  Cleatrice Burke, 02/17/2020 11:35 AM ______________________________________________________________________   Discussed status with Dr. Posey Pronto  on  02/17/2020  at 1138 and received approval for admission today.   Admission Coordinator:  Shadoe, Cryan, RN, time  4514 Date  02/17/2020    Assessment/Plan: Diagnosis: Debility   1. Does the need for close, 24 hr/day Medical supervision in concert with the patient's rehab needs make it unreasonable for this patient to be served in a less intensive setting? Yes  2. Co-Morbidities requiring supervision/potential complications: atrial fibrillation maintained on Coumadin, interstitial lung disease maintained on oxygen,  anxiety, diastolic congestive heart failure, COPD, hypertension, chronic back pain, history of right hip fracture status post right hip hemiarthroplasty and received inpatient rehab services 10/30/2019 to 11/16/2019 , hyperlipidemia, colitis associate with diverticulitis/IBS. 3. Due to bowel management, safety, skin/wound care, disease management, pain management and patient education, does the patient require 24 hr/day rehab nursing? Yes 4. Does the patient require coordinated care of a physician, rehab nurse, PT, OT to address physical and functional deficits in the context of the above medical diagnosis(es)? Yes Addressing deficits in the following areas: balance, endurance, locomotion, strength, transferring, bathing, dressing, toileting and psychosocial support 5. Can the patient actively participate in an intensive therapy program of at least 3 hrs of therapy 5 days a week? Yes 6. The potential for patient to make measurable gains while on inpatient rehab is excellent 7. Anticipated functional outcomes upon discharge from inpatient rehab: modified independent and supervision PT, modified independent and supervision OT, n/a SLP 8. Estimated rehab length of stay to reach the above functional goals is: 10-14 days. 9. Anticipated discharge destination: Home 10. Overall Rehab/Functional Prognosis: excellent     MD Signature: Delice Lesch, MD, ABPMR        Revision History                          Note Details  Author Jamse Arn, MD File Time 02/17/2020 12:04 PM  Author Type Physician Status Signed  Last Editor Jamse Arn, MD Service Physical Medicine and [place] # [phone] Admit Date 02/17/2020

## 2020-02-18 ENCOUNTER — Inpatient Hospital Stay (HOSPITAL_COMMUNITY): Payer: Medicare Other | Admitting: Occupational Therapy

## 2020-02-18 ENCOUNTER — Inpatient Hospital Stay (HOSPITAL_COMMUNITY): Payer: Medicare Other

## 2020-02-18 DIAGNOSIS — D62 Acute posthemorrhagic anemia: Secondary | ICD-10-CM

## 2020-02-18 DIAGNOSIS — R5381 Other malaise: Principal | ICD-10-CM

## 2020-02-18 DIAGNOSIS — R791 Abnormal coagulation profile: Secondary | ICD-10-CM

## 2020-02-18 DIAGNOSIS — G8918 Other acute postprocedural pain: Secondary | ICD-10-CM

## 2020-02-18 DIAGNOSIS — I4891 Unspecified atrial fibrillation: Secondary | ICD-10-CM | POA: Diagnosis not present

## 2020-02-18 LAB — CBC WITH DIFFERENTIAL/PLATELET
Abs Immature Granulocytes: 0.05 10*3/uL (ref 0.00–0.07)
Basophils Absolute: 0.1 10*3/uL (ref 0.0–0.1)
Basophils Relative: 1 %
Eosinophils Absolute: 0.4 10*3/uL (ref 0.0–0.5)
Eosinophils Relative: 4 %
HCT: 32 % — ABNORMAL LOW (ref 36.0–46.0)
Hemoglobin: 10 g/dL — ABNORMAL LOW (ref 12.0–15.0)
Immature Granulocytes: 1 %
Lymphocytes Relative: 18 %
Lymphs Abs: 1.5 10*3/uL (ref 0.7–4.0)
MCH: 28.9 pg (ref 26.0–34.0)
MCHC: 31.3 g/dL (ref 30.0–36.0)
MCV: 92.5 fL (ref 80.0–100.0)
Monocytes Absolute: 0.8 10*3/uL (ref 0.1–1.0)
Monocytes Relative: 10 %
Neutro Abs: 5.5 10*3/uL (ref 1.7–7.7)
Neutrophils Relative %: 66 %
Platelets: 416 10*3/uL — ABNORMAL HIGH (ref 150–400)
RBC: 3.46 MIL/uL — ABNORMAL LOW (ref 3.87–5.11)
RDW: 14.5 % (ref 11.5–15.5)
WBC: 8.2 10*3/uL (ref 4.0–10.5)
nRBC: 0 % (ref 0.0–0.2)

## 2020-02-18 LAB — COMPREHENSIVE METABOLIC PANEL
ALT: 10 U/L (ref 0–44)
AST: 15 U/L (ref 15–41)
Albumin: 2.3 g/dL — ABNORMAL LOW (ref 3.5–5.0)
Alkaline Phosphatase: 119 U/L (ref 38–126)
Anion gap: 8 (ref 5–15)
BUN: <5 mg/dL — ABNORMAL LOW (ref 8–23)
CO2: 24 mmol/L (ref 22–32)
Calcium: 8.8 mg/dL — ABNORMAL LOW (ref 8.9–10.3)
Chloride: 100 mmol/L (ref 98–111)
Creatinine, Ser: 0.48 mg/dL (ref 0.44–1.00)
GFR calc Af Amer: >60 mL/min (ref >60)
GFR calc non Af Amer: >60 mL/min (ref >60)
Glucose, Bld: 109 mg/dL — ABNORMAL HIGH (ref 70–99)
Potassium: 4.1 mmol/L (ref 3.5–5.1)
Sodium: 132 mmol/L — ABNORMAL LOW (ref 135–145)
Total Bilirubin: 0.6 mg/dL (ref 0.3–1.2)
Total Protein: 5.3 g/dL — ABNORMAL LOW (ref 6.5–8.1)

## 2020-02-18 LAB — PROTIME-INR
INR: 1.4 — ABNORMAL HIGH (ref 0.8–1.2)
Prothrombin Time: 16.7 seconds — ABNORMAL HIGH (ref 11.4–15.2)

## 2020-02-18 MED ORDER — METHOCARBAMOL 500 MG PO TABS
500.0000 mg | ORAL_TABLET | Freq: Two times a day (BID) | ORAL | Status: DC
  Administered 2020-02-18 – 2020-02-23 (×11): 500 mg via ORAL
  Filled 2020-02-18 (×11): qty 1

## 2020-02-18 MED ORDER — PROSOURCE PLUS PO LIQD
30.0000 mL | Freq: Two times a day (BID) | ORAL | Status: DC
  Administered 2020-02-18 – 2020-03-02 (×26): 30 mL via ORAL
  Filled 2020-02-18 (×25): qty 30

## 2020-02-18 MED ORDER — PRAVASTATIN SODIUM 40 MG PO TABS
40.0000 mg | ORAL_TABLET | Freq: Every day | ORAL | Status: DC
  Administered 2020-02-18 – 2020-03-01 (×13): 40 mg via ORAL
  Filled 2020-02-18 (×13): qty 1

## 2020-02-18 MED ORDER — WARFARIN 1.25 MG HALF TABLET
1.7500 mg | ORAL_TABLET | Freq: Once | ORAL | Status: AC
  Administered 2020-02-18: 1.75 mg via ORAL
  Filled 2020-02-18: qty 1

## 2020-02-18 NOTE — Plan of Care (Signed)
  Problem: RH Balance Goal: LTG Patient will maintain dynamic standing with ADLs (OT) Description: LTG:  Patient will maintain dynamic standing balance with assist during activities of daily living (OT)  Flowsheets (Taken 02/18/2020 1249) LTG: Pt will maintain dynamic standing balance during ADLs with: Independent with assistive device   Problem: Sit to Stand Goal: LTG:  Patient will perform sit to stand in prep for activites of daily living with assistance level (OT) Description: LTG:  Patient will perform sit to stand in prep for activites of daily living with assistance level (OT) Flowsheets (Taken 02/18/2020 1249) LTG: PT will perform sit to stand in prep for activites of daily living with assistance level: Independent with assistive device   Problem: RH Grooming Goal: LTG Patient will perform grooming w/assist,cues/equip (OT) Description: LTG: Patient will perform grooming with assist, with/without cues using equipment (OT) Flowsheets (Taken 02/18/2020 1249) LTG: Pt will perform grooming with assistance level of: Independent   Problem: RH Bathing Goal: LTG Patient will bathe all body parts with assist levels (OT) Description: LTG: Patient will bathe all body parts with assist levels (OT) Flowsheets (Taken 02/18/2020 1249) LTG: Pt will perform bathing with assistance level/cueing: Supervision/Verbal cueing   Problem: RH Dressing Goal: LTG Patient will perform upper body dressing (OT) Description: LTG Patient will perform upper body dressing with assist, with/without cues (OT). Flowsheets (Taken 02/18/2020 1249) LTG: Pt will perform upper body dressing with assistance level of: Independent Goal: LTG Patient will perform lower body dressing w/assist (OT) Description: LTG: Patient will perform lower body dressing with assist, with/without cues in positioning using equipment (OT) Flowsheets (Taken 02/18/2020 1249) LTG: Pt will perform lower body dressing with assistance level of:  Supervision/Verbal cueing   Problem: RH Toileting Goal: LTG Patient will perform toileting task (3/3 steps) with assistance level (OT) Description: LTG: Patient will perform toileting task (3/3 steps) with assistance level (OT)  Flowsheets (Taken 02/18/2020 1249) LTG: Pt will perform toileting task (3/3 steps) with assistance level: Independent   Problem: RH Toilet Transfers Goal: LTG Patient will perform toilet transfers w/assist (OT) Description: LTG: Patient will perform toilet transfers with assist, with/without cues using equipment (OT) Flowsheets (Taken 02/18/2020 1249) LTG: Pt will perform toilet transfers with assistance level of: Supervision/Verbal cueing   Problem: RH Tub/Shower Transfers Goal: LTG Patient will perform tub/shower transfers w/assist (OT) Description: LTG: Patient will perform tub/shower transfers with assist, with/without cues using equipment (OT) Flowsheets (Taken 02/18/2020 1249) LTG: Pt will perform tub/shower stall transfers with assistance level of: Supervision/Verbal cueing

## 2020-02-18 NOTE — Progress Notes (Signed)
Initial Nutrition Assessment  RD working remotely.  DOCUMENTATION CODES:   Not applicable, suspect malnutrition but unable to confirm at this time  INTERVENTION:   - Continue Boost Breeze po TID, each supplement provides 250 kcal and 9 grams of protein  - ProSource Plus po BID, each supplement provides 100 kcal and 15 grams of protein  - Encourage adequate PO intake  NUTRITION DIAGNOSIS:   Increased nutrient needs related to chronic illness (CHF, COPD) as evidenced by estimated needs.  GOAL:   Patient will meet greater than or equal to 90% of their needs  MONITOR:   PO intake, Supplement acceptance, Labs, Weight trends, Skin  REASON FOR ASSESSMENT:   Diagnosis    ASSESSMENT:   86 year old female with PMH of atrial fibrillation, ILD, anxiety, CHF, COPD, HTN, chronic back pain, right hip fracture s/p hemiarthroplasty (received inpatient rehab services 10/30/19 to 11/16/19), HLD, colitis associated with diverticulitis/IBS. Pt presented on 02/08/20 with abdominal pain, coffee-ground emesis. CT angiogram showed what appeared to be small bowel ischemia in the mid lower abdomen likely secondary to torsion of the mesentery. Pt underwent ex-lap, LOA, small bowel resection, and G-tube placement on 02/08/20. Surgical pathology hemorrhagic necrosis consistent with ischemia. Working with clamping of G-tube and diet slowly advanced. Admitted to CIR on 02/17/20.  G-tube remains in place, currently clamped. Pt tolerating a soft diet.  Unable to reach pt via phone call to room today. Will obtain diet and weight history from pt at follow-up.  Reviewed weight history in chart. Pt with a 14 kg weight loss since 10/12/19. This is a 16.1% weight loss in less than 5 months which is severe and significant for timeframe. Strongly suspect pt with malnutrition but unable to confirm without additional information.  Pt accepting Boost Breeze supplements per [facility] documentation. Will continue with these and add  ProSource Plus to aid pt in meeting kcal and protein needs.  Meal Completion: 80% x 1 documented meal  Medications reviewed and include: colace, pepcid, Boost Breeze TID, miralax, warfarin  Labs reviewed: sodium 132  NUTRITION - FOCUSED PHYSICAL EXAM:  Unable to complete at this time. RD working remotely.  Diet Order:   Diet Order            DIET SOFT Room service appropriate? Yes; Fluid consistency: Thin  Diet effective now                 EDUCATION NEEDS:   No education needs have been identified at this time  Skin:  Skin Assessment: Skin Integrity Issues: Incisions: abdomen  Last BM:  02/18/20  Height:   Ht Readings from Last 1 Encounters:  02/17/20 5\' 6"  (1.676 m)    Weight:   Wt Readings from Last 1 Encounters:  02/17/20 73.1 kg    Ideal Body Weight:  59.1 kg  BMI:  Body mass index is 26.01 kg/m.  Estimated Nutritional Needs:   Kcal:  1700-1900  Protein:  85-100 grams  Fluid:  1.7-1.9 L    Gaynell Face, MS, RD, LDN Inpatient Clinical Dietitian Please see AMiON for contact information.

## 2020-02-18 NOTE — Progress Notes (Signed)
[facility] for warfarin Indication: atrial fibrillation  Allergies  Allergen Reactions   Doxycycline Other (See Comments)    Rash on face and neck   Vicodin [Hydrocodone-Acetaminophen]     Hallucinations    Flagyl [Metronidazole] Nausea And Vomiting   Moxifloxacin Other (See Comments)     Weakness/fatigue   Pantoprazole Sodium Rash   Penicillins Rash    Has patient had a PCN reaction causing immediate rash, facial/tongue/throat swelling, SOB or lightheadedness with hypotension:unsure Has patient had a PCN reaction causing severe rash involving mucus membranes or skin necrosis:unsure Has patient had a PCN reaction that required hospitalization:No Has patient had a PCN reaction occurring within the last 10 years:Yes Cannot recall exact reaction due to time lapse If all of the above answers are "NO", then may proceed with Cephalosporin use.     Patient Measurements: Height: 5\' 6"  (167.6 cm) Weight: 73.1 kg (161 lb 2.5 oz) IBW/kg (Calculated) : 59.3  Vital Signs:    Labs: Recent Labs    02/16/20 0404 02/16/20 0404 02/17/20 0833 02/18/20 0913  HGB 9.5*   < > 9.6* 10.0*  HCT 30.4*  --  29.9* 32.0*  PLT 387  --  399 416*  LABPROT 17.2*  --  16.8* 16.7*  INR 1.5*  --  1.4* 1.4*  CREATININE  --   --   --  0.48   < > = values in this interval not displayed.    Estimated Creatinine Clearance: 53.6 mL/min (by C-G formula based on SCr of 0.47 mg/dL).   Medical History: Past Medical History:  Diagnosis Date   Anemia    - Hgb 9.7gm% on 07/13/2008 in [place] -  Hgg 129gm% wiht normal irone levsl and ferritin 10/27/2008 in [place] Recurrent otitis/sinusitis   Anxiety    chronic BZ prn   Atrial fibrillation (HCC)    chronic anticoag   Bronchiectasis    >PFT 07/13/2008 in [place] 1.9L/76%, FVC 2.45L/74, Ratio 79, TLC 121%, DLCO 64%  AE BRonchiectasis - Dec 2010.New Rx:  outpatient - Feb 2011 - Rx outpatient   CHF (congestive  heart failure) (HCC)    COPD (chronic obstructive pulmonary disease) (HCC)    bronchiectasis   Cricopharyngeal achalasia    Depression    Diverticulosis    Dyslipidemia    Eczema    GERD with stricture    Glaucoma    H. 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HCC)    Vertigo    Wears glasses     Medications:  Scheduled:   diltiazem  60 mg Oral Q6H   docusate sodium  100 mg Oral BID   enoxaparin (LOVENOX) injection  40 mg Subcutaneous Q24H   famotidine  20 mg Oral Daily   feeding supplement  1 Container Oral TID BM   FLUoxetine  10 mg Oral QHS   levothyroxine  50 mcg Oral Q0600   mometasone-formoterol  2 puff Inhalation BID   polyethylene glycol  17 g Oral Daily   Warfarin - Pharmacist Dosing Inpatient   Does not apply q1600   Infusions:   PRN: acetaminophen **OR** acetaminophen, melatonin, ondansetron **OR** ondansetron (ZOFRAN) IV Anti-infectives (From admission, onward)   None      Assessment: 86 yo female  with a history of permanent atrial fibrillation presents with abdominal pain and coffee-ground emesis. The patient was found to have small bowel ischemia and underwent exploratory laparotomy, lysis of adhesions, small bowel resection and gastrostomy tube placementon 02/08/20. The patient's INR upon admit was supratherapeutic at 3.3 and the patient received KCentra 1625 units once prior to surgery.  She is on warfarin PTA for afib and pharmacy consulted to restart on 8/15.  INR remains SUBtherapeutic at 1.4. Hgb stable. No s/sx of bleeding noted.   PTA: In April, was on warfarin 2.5 mg  TTSS and 1.25 mg MWF However, daughter endorses that she has been therapeutic on 1.25 mg daily for the last 6 weeks (dosed by home health)  Goal of Therapy:  INR 2-3 Monitor platelets by  anticoagulation protocol: Yes   Plan:  Warfarin 1.75 mg x 1 PO tonight Continue Enoxaparin 40 mg SubQ q24h until INR is therapeutic  Daily PT/INR CBC to MWF only Monitor for s/sx of bleeding, PO intake, Drug Interactions   Thank you for allowing [place] to participate in this patients care.   Jens Som, PharmD [phone] until 03:30 pm, then see amion for appropriate number for unit. 02/18/2020 1:07 PM

## 2020-02-18 NOTE — Progress Notes (Signed)
Physical Therapy Session Note  Patient Details  Name: Stephanie Cortez MRN: [id number] Date of Birth: [birth year]  Today's Date: 02/18/2020 PT Individual Time: 1345-1445 PT Individual Time Calculation (min): 60 min   Short Term Goals: Week 1:  PT Short Term Goal 1 (Week 1): Patient will perform bed mobility with supervision using bed rails to simulate home set up. PT Short Term Goal 2 (Week 1): Patient will perform basic transfers with supervision using LRAD. PT Short Term Goal 3 (Week 1): Patient will ambulate >150 ft using LRAD with supervision.  Skilled Therapeutic Interventions/Progress Updates:   Received pt supine in bed, pt agreeable to therapy, and denied any pain during session. Session with emphasis on functional mobility/transfers, generalized strengthening, dynamic standing balance/coordination, ambulation, toileting, and improved activity tolerance. Pt transferred supine<>sitting EOB with min A and use of bedrails and increased time and transferred stand<>pivot bed<>WC with RW and min A. Pt performed WC mobility 166ft using bilateral UEs and supervision to therapy gym and ambulated 147ft with RW and min A with +2 for WC follow due to availability. Pt with flexed trunk, narrow BOS, and decreased weight shifting to R due to tightness in R hip and required cues for upright posture. Pt required multiple rest breaks throughout session due to SOB and poor endurance. Pt transferred stand<>pivot WC<>mat with RW min A x 2 additional trials throughout session with cues for hand placement on RW and WC armrests when standing. Pt performed alternating toe taps to 6in step 2x12 with bilateral UE support on RW and min A. Pt reported urge to have BM and transferred back to room total A and ambulated additional 61ft x 2 trials to/from bathroom with RW min A. Pt able to void and with medium sized BM. Pt transferred sit<>stand with RW min A and able to perform hygiene management with CGA with increased time.  Due to time management restrictions therapist assisted with remainder of peri-care total A and assisted pulling pants over hips with mod A. Sit<>supine with supervision. Concluded session with pt supine in bed, needs within reach, and bed alarm on.   Therapy Documentation Precautions:  Restrictions Weight Bearing Restrictions: No   Therapy/Group: Individual Therapy Alfonse Alpers PT, DPT   02/18/2020, 7:27 AM

## 2020-02-18 NOTE — Progress Notes (Signed)
[facility] Individual Statement of Services  Patient Name:  Stephanie Cortez  Date:  02/18/2020  Welcome to the [facility].  Our goal is to provide you with an individualized program based on your diagnosis and situation, designed to meet your specific needs.  With this comprehensive rehabilitation program, you will be expected to participate in at least 3 hours of rehabilitation therapies Monday-Friday, with modified therapy programming on the weekends.  Your rehabilitation program will include the following services:  Physical Therapy (PT), Occupational Therapy (OT), 24 hour per day rehabilitation nursing, Neuropsychology, Care Coordinator, Rehabilitation Medicine, Nutrition Services and Pharmacy Services  Weekly team conferences will be held on Wednesday to discuss your progress.  Your Inpatient Rehabilitation Care Coordinator will talk with you frequently to get your input and to update you on team discussions.  Team conferences with you and your family in attendance  also be held.  Expected length of stay: 7-10 days  Overall anticipated outcome: supervision-mod/i level  Depending on your progress and recovery, your program may change. Your Inpatient Rehabilitation Care Coordinator will coordinate services and will keep you informed of any changes. Your Inpatient Rehabilitation Care Coordinator's name and contact numbers are listed  below.  The following services may also be recommended but are not provided by the [facility] will be made to provide these services after discharge if needed.  Arrangements include referral to agencies that provide these services.  Your insurance has been verified to be:  [employer] Your primary doctor is:  Alben Spittle  Pertinent information will be shared with your doctor and  your insurance company.  Inpatient Rehabilitation Care Coordinator:  Ovidio Kin, [place]  Information discussed with and copy given to patient by: Elease Hashimoto, 02/18/2020, 2:20 PM

## 2020-02-18 NOTE — Progress Notes (Signed)
Inpatient Rehabilitation  Patient information reviewed and entered into eRehab system by Fabiano Ginley M. Radha Coggins, M.A., CCC/SLP, PPS Coordinator.  Information including medical coding, functional ability and quality indicators will be reviewed and updated through discharge.    

## 2020-02-18 NOTE — Evaluation (Signed)
Physical Therapy Assessment and Plan  Patient Details  Name: Stephanie Cortez MRN: [id number] Date of Birth: Feb 26, 1934  PT Diagnosis: Abnormal posture, Difficulty walking, Edema and Muscle weakness Rehab Potential: Excellent ELOS: 10-14 days   Today's Date: 02/18/2020 PT Individual Time: 0855-1010 PT Individual Time Calculation (min): 75 min    Hospital Problem: Principal Problem:   Debility Active Problems:   Sepsis ([place])   Subtherapeutic international normalized ratio (INR)   Past Medical History:  Past Medical History:  Diagnosis Date  . Anemia    - Hgb 9.7gm% on 07/13/2008 in [place] -  Hgg 129gm% wiht normal irone levsl and ferritin 10/27/2008 in [place] Recurrent otitis/sinusitis  . Anxiety    chronic BZ prn  . Atrial fibrillation (HCC)    chronic anticoag  . Bronchiectasis    >PFT 07/13/2008 in [place] 1.9L/76%, FVC 2.45L/74, Ratio 79, TLC 121%, DLCO 64%  AE BRonchiectasis - Dec 2010.New Rx:  outpatient - Feb 2011 - Rx outpatient  . CHF (congestive heart failure) ([place])   . COPD (chronic obstructive pulmonary disease) (HCC)    bronchiectasis  . Cricopharyngeal achalasia   . Depression   . Diverticulosis   . Dyslipidemia   . Eczema   . GERD with stricture   . Glaucoma   . H. pylori infection   . Hypertension   . Hyponatremia    chronic, s/p endo eval 06/2012  . Hypothyroid   . IBS (irritable bowel syndrome)   . Lumbar disc disease   . Neuropathy of both feet   . Segmental colitis ([place])   . Vertigo   . Wears glasses    Past Surgical History:  Past Surgical History:  Procedure Laterality Date  . BOWEL RESECTION N/A 02/08/2020   Procedure: SMALL BOWEL RESECTION;  Surgeon: Donnie Mesa, MD;  Location: [facility];  Service: General;  Laterality: N/A;  . BREAST SURGERY     br bx  . CARDIAC CATHETERIZATION  07/01/2013  . CATARACT EXTRACTION     both  . COLONOSCOPY    . ESOPHAGOSCOPY W/ BOTOX INJECTION  12/11/2011   Procedure: ESOPHAGOSCOPY WITH BOTOX INJECTION;   Surgeon: Rozetta Nunnery, MD;  Location: [facility];  Service: ENT;  Laterality: N/A;  esophogoscopy with dilation, botox injection  . FOOT SURGERY  03/14/2011   gastroc slide-rt  . GASTROSTOMY N/A 02/08/2020   Procedure: INSERTION OF GASTROSTOMY TUBE;  Surgeon: Donnie Mesa, MD;  Location: [facility];  Service: General;  Laterality: N/A;  . HIP ARTHROPLASTY Right 10/28/2019   Procedure: ARTHROPLASTY BIPOLAR HIP (HEMIARTHROPLASTY);  Surgeon: Marybelle Killings, MD;  Location: [facility];  Service: Orthopedics;  Laterality: Right;  . LAPAROTOMY N/A 02/08/2020   Procedure: EXPLORATORY LAPAROTOMY;  Surgeon: Donnie Mesa, MD;  Location: [facility];  Service: General;  Laterality: N/A;  . LYSIS OF ADHESION N/A 02/08/2020   Procedure: LYSIS OF ADHESIONS;  Surgeon: Donnie Mesa, MD;  Location: [facility];  Service: General;  Laterality: N/A;  . TOTAL ABDOMINAL HYSTERECTOMY    . TOTAL HIP REVISION Right 11/16/2019   Procedure: TOTAL HIP REVISION BIPOLAR TO CEMENTED BIPOLAR;  Surgeon: Marybelle Killings, MD;  Location: [facility];  Service: Orthopedics;  Laterality: Right;  . WISDOM TOOTH EXTRACTION      Assessment & Plan Clinical Impression: Patient is a 86 y.o. year old right-handed female with history of atrial fibrillation maintained on Coumadin, interstitial lung disease maintained on oxygen, anxiety, diastolic congestive heart failure, COPD, hypertension, chronic back pain, history of  right hip fracture status post right hip hemiarthroplasty and received inpatient rehab services 10/30/2019 to 11/16/2019 , hyperlipidemia, colitis associate with diverticulitis/IBS.  History taken from chart review, daughter, and patient.  Patient lives alone.  1 level home 2 steps to entry.  Independent with assistive device.  She presented on 02/08/2020 with abdominal pain, coffee-ground emesis as well as nausea and vomiting.  CT angiogram showed what appeared to be small bowel ischemia in the mid lower abdomen likely secondary to torsion  of the mesentery.  She was given Kcentra to reverse INR of 3.3.  Admission chemistry sodium 132, glucose 175, WBC 18,500, hemoglobin 13.9, lactic acid 2.4.  Patient underwent exploratory laparotomy lysis of adhesions small bowel resection gastrostomy tube placement 02/08/2020 per Dr.Tsuei.  Surgical pathology hemorrhagic necrosis consistent with ischemia.  Working with clamping of G-tube and diet slowly advanced.  Chronic Coumadin has been resumed.  Acute blood loss anemia of 9.6 and latest INR of 1.4.  Therapy evaluations completed with recommendations of inpatient rehab services. Please see preadmission assessment from earlier today.  Patient transferred to CIR on 02/17/2020 .   Patient currently requires min with mobility secondary to muscle weakness and muscle joint tightness, decreased cardiorespiratoy endurance and decreased sitting balance, decreased standing balance, decreased postural control and decreased balance strategies.  Prior to hospitalization, patient was modified independent  with mobility and lived with Alone (daughter will stay with pt) in a House home.  Home access is 2 front, 3 in backStairs to enter.  Patient will benefit from skilled PT intervention to maximize safe functional mobility, minimize fall risk and decrease caregiver burden for planned discharge home with intermittent assist.  Anticipate patient will benefit from follow up [facility] at discharge.  PT - End of Session Activity Tolerance: Tolerates 30+ min activity with multiple rests Endurance Deficit: Yes Endurance Deficit Description: Rest breaks within BADL Tasks PT Assessment Rehab Potential (ACUTE/IP ONLY): Excellent PT Barriers to Discharge: Home environment access/layout;Lack of/limited family support PT Barriers to Discharge Comments: 2-3 STE home, family can provide 24/7 supervision for limited time after d/c PT Patient demonstrates impairments in the following area(s): Balance;Safety;Edema;Skin  Integrity;Endurance;Motor;Nutrition;Pain PT Transfers Functional Problem(s): Bed Mobility;Bed to Chair;Car;Furniture PT Locomotion Functional Problem(s): Ambulation;Wheelchair Mobility;Stairs PT Plan PT Intensity: Minimum of 1-2 x/day ,45 to 90 minutes PT Frequency: 5 out of 7 days PT Duration Estimated Length of Stay: 10-14 days PT Treatment/Interventions: Community reintegration;Ambulation/gait training;DME/adaptive equipment instruction;Neuromuscular re-education;Psychosocial support;Stair training;UE/LE Strength taining/ROM;Wheelchair propulsion/positioning;Balance/vestibular training;Discharge planning;Functional electrical stimulation;Pain management;Skin care/wound management;Therapeutic Activities;UE/LE Coordination activities;Disease management/prevention;Functional mobility training;Patient/family education;Splinting/orthotics;Therapeutic Exercise PT Transfers Anticipated Outcome(s): mod I using LRAD PT Locomotion Anticipated Outcome(s): mod I using LRAD household 50 ft, supervision community >150 ft PT Recommendation Follow Up Recommendations: Home health PT Patient destination: Home Equipment Recommended: To be determined Equipment Details: patient has RW and rental w/c at home, will determine other DME needs based on patient's progress   PT Evaluation Precautions/Restrictions Precautions Precautions: Fall Restrictions Weight Bearing Restrictions: No Home Living/Prior Functioning Home Living Available Help at Discharge: Family;Available 24 hours/day;Available PRN/intermittently (availabel 24/7 for a few days at d/c) Type of Home: House Home Access: Stairs to enter [employer] of Steps: 2 front, 3 in back Entrance Stairs-Rails: Can reach both Home Layout: One level Bathroom Shower/Tub: Walk-in shower;Tub/shower unit Bathroom Toilet: Handicapped height Bathroom Accessibility: Yes Additional Comments: Has been using tub shower with tub bench  Lives With: Alone  (daughter will stay with pt) Prior Function Level of Independence: Independent with basic ADLs;Independent with homemaking with  ambulation;Independent with gait;Independent with transfers;Requires assistive device for independence  Able to Take Stairs?: Yes Driving: No (Not since her hip fx earlier this year) Vision/Perception  Perception Perception: Within Functional Limits Praxis Praxis: Intact  Cognition Overall Cognitive Status: Within Functional Limits for tasks assessed Arousal/Alertness: Awake/alert Memory: Appears intact Immediate Memory Recall: Sock;Blue;Bed Memory Recall Sock: Without Cue Memory Recall Blue: Without Cue Memory Recall Bed: Without Cue Awareness: Appears intact Problem Solving: Appears intact Safety/Judgment: Appears intact Sensation Sensation Light Touch: Appears Intact Hot/Cold: Appears Intact Proprioception: Appears Intact Coordination Gross Motor Movements are Fluid and Coordinated: No Fine Motor Movements are Fluid and Coordinated: Yes Coordination and Movement Description: generalized deconditioning and decreased postural control Heel Shin Test: limted on R due to decreased R hip ROM Motor  Motor Motor - Skilled Clinical Observations: slow and generalized weakness   Trunk/Postural Assessment  Cervical Assessment Cervical Assessment: Exceptions to [facility] (forward head) Thoracic Assessment Thoracic Assessment: Exceptions to [facility] (rounded shoulders) Lumbar Assessment Lumbar Assessment: Exceptions to [facility] (posterior pelvic tilt) Postural Control Postural Control: Deficits on evaluation (decreased/delayed)  Balance Balance Balance Assessed: Yes Static Sitting Balance Static Sitting - Balance Support: Feet supported Static Sitting - Level of Assistance: 5: Stand by assistance Dynamic Sitting Balance Dynamic Sitting - Balance Support: Feet supported Dynamic Sitting - Level of Assistance: 5: Stand by assistance Sitting balance - Comments: limited  tolerance in unsupported sitting due to R hip tightness Static Standing Balance Static Standing - Balance Support: During functional activity Static Standing - Level of Assistance: 4: Min assist Dynamic Standing Balance Dynamic Standing - Balance Support: During functional activity Dynamic Standing - Level of Assistance: 4: Min assist Extremity Assessment  RUE Assessment RUE Assessment: Within Functional Limits LUE Assessment LUE Assessment: Within Functional Limits RLE Assessment RLE Assessment: Exceptions to [facility] Active Range of Motion (AROM) Comments: limited R hip flexion ~90 deg to stiffness General Strength Comments: Grossly 3+/5 to 4/5 throughout LLE Assessment LLE Assessment: Exceptions to [facility] Active Range of Motion (AROM) Comments: WFL for functional mobility General Strength Comments: Grossly 3+/5 to 4/5 throughout  Care Tool Care Tool Bed Mobility Roll left and right activity   Roll left and right assist level: Supervision/Verbal cueing (requires bed rail, which she has at home)    Sit to lying activity   Sit to lying assist level: Supervision/Verbal cueing    Lying to sitting edge of bed activity   Lying to sitting edge of bed assist level: Minimal Assistance - Patient > 75%     Care Tool Transfers Sit to stand transfer   Sit to stand assist level: Moderate Assistance - Patient 50 - 74% Sit to stand assistive device: Walker  Chair/bed transfer   Chair/bed transfer assist level: Moderate Assistance - Patient 50 - 74% Chair/bed transfer assistive device: Barrister's clerk transfer   Assist Level: Minimal Assistance - Patient > 75% Assistive Device Comment: RW and BSC  Geneticist, molecular transfer assist level: Minimal Assistance - Patient > 75% Health visitor Comment: RW    Care Tool Locomotion Ambulation   Assist level: Contact Guard/Touching assist Assistive device: Walker-rolling Max distance: 127 ft  Walk 10 feet activity   Assist level:  Contact Guard/Touching assist Assistive device: Walker-rolling   Walk 50 feet with 2 turns activity   Assist level: Contact Guard/Touching assist Assistive device: Walker-rolling  Walk 150 feet activity   Assist level: Contact Guard/Touching assist Assistive device: Walker-rolling  Walk 10 feet on uneven surfaces activity  Walk 10 feet on uneven surfaces activity did not occur: Safety/medical concerns (decreased strength/activity tolerance)      Stairs   Assist level: Minimal Assistance - Patient > 75% Stairs assistive device: 2 hand rails Max number of stairs: 2  Walk up/down 1 step activity   Walk up/down 1 step (curb) assist level: Minimal Assistance - Patient > 75% Walk up/down 1 step or curb assistive device: 2 hand rails Walk up/down 4 steps activity did not occuR: Safety/medical concerns (decreased strength/activity tolerance)  Walk up/down 4 steps activity      Walk up/down 12 steps activity Walk up/down 12 steps activity did not occur: Safety/medical concerns (decreased strength/activity tolerance)      Pick up small objects from floor Pick up small object from the floor (from standing position) activity did not occur: Safety/medical concerns (decreased balance/safety)      Wheelchair   Type of Wheelchair: Manual   Wheelchair assist level: Supervision/Verbal cueing Max wheelchair distance: >150 ft  Wheel 50 feet with 2 turns activity   Assist Level: Supervision/Verbal cueing  Wheel 150 feet activity   Assist Level: Supervision/Verbal cueing    Refer to Care Plan for Long Term Goals  SHORT TERM GOAL WEEK 1 PT Short Term Goal 1 (Week 1): Patient will perform bed mobility with supervision using bed rails to simulate home set up. PT Short Term Goal 2 (Week 1): Patient will perform basic transfers with supervision using LRAD. PT Short Term Goal 3 (Week 1): Patient will ambulate >150 ft using LRAD with supervision.  Recommendations for other services: None   Skilled  Therapeutic Intervention In addition to the PT evaluation above, the patient performed the following skilled PT interventions: Patient in bed with her son-in-law at bedside upon PT arrival. Patient alert and agreeable to PT session. Patient denied pain during session. The patient and her son-in-law had several questions regarding patient's medical status and POC while in inpatient rehab. Provided general education on patient's medical procedure and recovery and encourage the patient and family to address detailed medical questions to the medical team during her stay. Also, provided education on benefits of inpatient rehab and discussed patient's stated goal of achieving mod I functional status at d/c in order to return to living independently and continue her recovery from her hip fractures. Patient states she was receiving HHPT PTA.   Therapeutic Activity: Transfers: Patient performed a toilet transfer bed>BSC performing a stand pivot using the RW with min A. She was continent of bowl and bladder during toileting and required total A for peri-care and LB clothing management. She performed sit to/from stand x2 and stand pivot x1 using a RW with min A for boosting up x1 then CGA with cues for scooting forward, forward weight shift, and hand placement, patient initially puts B UEs on the RW.   Gait Training:  Patient ambulated 127 feet using RW with CGA. Ambulated with step-through gait pattern with decreased stance time and step length on R, increased hip and knee flexion in stance RL, decreased step height, decreased gait speed, forward trunk flexion and downward head gaze. Provided verbal cues for erect posture, looking ahead, and staying close to the RW for safety.  Instructed pt in results of PT evaluation as detailed above, PT POC, rehab potential, rehab goals, and discharge recommendations. Additionally discussed CIR's policies regarding fall safety and use of chair alarm and/or quick release belt. Pt  verbalized understanding and in agreement. Will update pt's family members as they become available.  Patient in bed with her son-in-law at bedside at end of session with breaks locked, bed alarm set, and all needs within reach.    Discharge Criteria: Patient will be discharged from PT if patient refuses treatment 3 consecutive times without medical reason, if treatment goals not met, if there is a change in medical status, if patient makes no progress towards goals or if patient is discharged from hospital.  The above assessment, treatment plan, treatment alternatives and goals were discussed and mutually agreed upon: by patient  Doreene Burke PT, DPT  02/18/2020, 12:54 PM

## 2020-02-18 NOTE — Progress Notes (Signed)
[place] PHYSICAL MEDICINE & REHABILITATION PROGRESS NOTE  Subjective/Complaints: Patient seen laying in bed this morning.  She states she slept well overnight.  She states she is ready begin therapies.  ROS: Denies CP, SOB, N/V/D  Objective: Vital Signs: Blood pressure (!) 128/57, pulse 72, temperature 98.2 F (36.8 C), resp. rate 17, height 5\' 6"  (1.676 m), weight 73.1 kg, SpO2 95 %. No results found. Recent Labs    02/16/20 0404 02/17/20 0833  WBC 7.2 8.5  HGB 9.5* 9.6*  HCT 30.4* 29.9*  PLT 387 399   No results for input(s): NA, K, CL, CO2, GLUCOSE, BUN, CREATININE, CALCIUM in the last 72 hours.  Physical Exam: BP (!) 128/57 (BP Location: Right Arm)   Pulse 72   Temp 98.2 F (36.8 C)   Resp 17   Ht 5\' 6"  (1.676 m)   Wt 73.1 kg   LMP  (LMP Unknown)   SpO2 95%   BMI 26.01 kg/m  Constitutional: No distress . Vital signs reviewed. HENT: Normocephalic.  Atraumatic. Eyes: EOMI. No discharge. Cardiovascular: No JVD.  Irregularly irregular. Respiratory: Normal effort.  No stridor.  Bilateral clear to auscultation. GI: Abdominal tenderness.  BS +. Skin: Abdominal incision with staples CDI.  G-tube in place. Left neck with dressing CDI. Psych: Normal mood.  Normal behavior. Musc: No edema in extremities.  No tenderness in extremities. Neuro: Alert Motor: Bilateral upper extremities, left lower extremity: 5/5 proximal distally Right lower extremity: 4+/5 proximally, 5/5 distally  Assessment/Plan: 1. Functional deficits secondary to debility which require 3+ hours per day of interdisciplinary therapy in a comprehensive inpatient rehab setting.  Physiatrist is providing close team supervision and 24 hour management of active medical problems listed below.  Physiatrist and rehab team continue to assess barriers to discharge/monitor patient progress toward functional and medical goals  Care Tool:  Bathing              Bathing assist       Upper Body  Dressing/Undressing Upper body dressing        Upper body assist      Lower Body Dressing/Undressing Lower body dressing            Lower body assist       Toileting Toileting    Toileting assist       Transfers Chair/bed transfer  Transfers assist           Locomotion Ambulation   Ambulation assist              Walk 10 feet activity   Assist           Walk 50 feet activity   Assist           Walk 150 feet activity   Assist           Walk 10 feet on uneven surface  activity   Assist           Wheelchair     Assist               Wheelchair 50 feet with 2 turns activity    Assist            Wheelchair 150 feet activity     Assist            Medical Problem List and Plan: 1.    Debility secondary to small bowel ischemia status post exploratory laparotomy lysis of adhesions small bowel resection gastrostomy tube placement 02/08/2020  Begin  CIR evaluations 2.  Antithrombotics: -DVT/anticoagulation: Chronic Coumadin for history of atrial fibrillation             INR subtherapeutic on 8/18             -antiplatelet therapy: N/A 3. Pain Management: Robaxin as needed  Monitor with increased exertion 4. Mood: Prozac 10 mg daily             -antipsychotic agents: N/A 5. Neuropsych: This patient is capable of making decisions on her own behalf. 6. Skin/Wound Care: Routine skin checks 7. Fluids/Electrolytes/Nutrition: Routine in and outs.    CMP pending. 8.  Acute blood loss anemia.               Hemoglobin 9.6 on 8/18, labs pending 9.  Atrial fibrillation.  Continue Cardizem 60 mg every 6 hours.  Chronic Coumadin resumed             Rate controlled on 8/19  Monitor with increased exertion. 10.  Hypothyroidism.  Synthroid  LOS: 1 days A FACE TO FACE EVALUATION WAS PERFORMED  Ixel Boehning [name] 02/18/2020, 8:28 AM

## 2020-02-18 NOTE — Evaluation (Addendum)
Occupational Therapy Assessment and Plan  Patient Details  Name: Stephanie Cortez MRN: [id number] Date of Birth: 07-27-1933  OT Diagnosis: muscle weakness (generalized) and decreased activity tolerance Rehab Potential: Rehab Potential (ACUTE ONLY): Excellent ELOS: 7-10 days   Today's Date: 02/18/2020 OT Individual Time: 1100-1200 OT Individual Time Calculation (min): 60 min     Hospital Problem: Principal Problem:   Debility Active Problems:   Sepsis ([place])   Subtherapeutic international normalized ratio (INR)   Past Medical History:  Past Medical History:  Diagnosis Date  . Anemia    - Hgb 9.7gm% on 07/13/2008 in [place] -  Hgg 129gm% wiht normal irone levsl and ferritin 10/27/2008 in [place] Recurrent otitis/sinusitis  . Anxiety    chronic BZ prn  . Atrial fibrillation (HCC)    chronic anticoag  . Bronchiectasis    >PFT 07/13/2008 in [place] 1.9L/76%, FVC 2.45L/74, Ratio 79, TLC 121%, DLCO 64%  AE BRonchiectasis - Dec 2010.New Rx:  outpatient - Feb 2011 - Rx outpatient  . CHF (congestive heart failure) ([place])   . COPD (chronic obstructive pulmonary disease) (HCC)    bronchiectasis  . Cricopharyngeal achalasia   . Depression   . Diverticulosis   . Dyslipidemia   . Eczema   . GERD with stricture   . Glaucoma   . H. pylori infection   . Hypertension   . Hyponatremia    chronic, s/p endo eval 06/2012  . Hypothyroid   . IBS (irritable bowel syndrome)   . Lumbar disc disease   . Neuropathy of both feet   . Segmental colitis ([place])   . Vertigo   . Wears glasses    Past Surgical History:  Past Surgical History:  Procedure Laterality Date  . BOWEL RESECTION N/A 02/08/2020   Procedure: SMALL BOWEL RESECTION;  Surgeon: Donnie Mesa, MD;  Location: [facility];  Service: General;  Laterality: N/A;  . BREAST SURGERY     br bx  . CARDIAC CATHETERIZATION  07/01/2013  . CATARACT EXTRACTION     both  . COLONOSCOPY    . ESOPHAGOSCOPY W/ BOTOX INJECTION  12/11/2011   Procedure:  ESOPHAGOSCOPY WITH BOTOX INJECTION;  Surgeon: Rozetta Nunnery, MD;  Location: [place];  Service: ENT;  Laterality: N/A;  esophogoscopy with dilation, botox injection  . FOOT SURGERY  03/14/2011   gastroc slide-rt  . GASTROSTOMY N/A 02/08/2020   Procedure: INSERTION OF GASTROSTOMY TUBE;  Surgeon: Donnie Mesa, MD;  Location: [place];  Service: General;  Laterality: N/A;  . HIP ARTHROPLASTY Right 10/28/2019   Procedure: ARTHROPLASTY BIPOLAR HIP (HEMIARTHROPLASTY);  Surgeon: Marybelle Killings, MD;  Location: [facility];  Service: Orthopedics;  Laterality: Right;  . LAPAROTOMY N/A 02/08/2020   Procedure: EXPLORATORY LAPAROTOMY;  Surgeon: Donnie Mesa, MD;  Location: [place];  Service: General;  Laterality: N/A;  . LYSIS OF ADHESION N/A 02/08/2020   Procedure: LYSIS OF ADHESIONS;  Surgeon: Donnie Mesa, MD;  Location: [place];  Service: General;  Laterality: N/A;  . TOTAL ABDOMINAL HYSTERECTOMY    . TOTAL HIP REVISION Right 11/16/2019   Procedure: TOTAL HIP REVISION BIPOLAR TO CEMENTED BIPOLAR;  Surgeon: Marybelle Killings, MD;  Location: [place];  Service: Orthopedics;  Laterality: Right;  . WISDOM TOOTH EXTRACTION      Assessment & Plan Clinical Impression: Patient is a 86 y.o. year old female with recent admission to the hospital with history of atrial fibrillation maintained on Coumadin, interstitial lung disease maintained on oxygen, anxiety, diastolic congestive  heart failure, COPD, hypertension, chronic back pain, history of right hip fracture status post right hip hemiarthroplasty and received inpatient rehab services 10/30/2019 to 11/16/2019 , hyperlipidemia, colitis associate with diverticulitis/IBS. History taken from chart review, daughter, and patient. Patient lives alone. 1 level home 2 steps to entry. Independent with assistive device. She presented on 02/08/2020 with abdominal pain, coffee-ground emesis as well as nausea and vomiting. CT angiogram showed what appeared to be small bowel  ischemia in the mid lower abdomen likely secondary to torsion of the mesentery. She was given Kcentra to reverse INR of 3.3. Admission chemistry sodium 132, glucose 175, WBC 18,500, hemoglobin 13.9, lactic acid 2.4. Patient underwent exploratory laparotomy lysis of adhesions small bowel resection gastrostomy tube placement 02/08/2020 per Dr.Tsuei. Surgical pathology hemorrhagic necrosis consistent with ischemia. Working with clamping of G-tube and diet slowly advanced.  Patient transferred to CIR on 02/17/2020 .    Patient currently requires min/mod with basic self-care skills secondary to muscle weakness, decreased cardiorespiratoy endurance and decreased standing balance and decreased balance strategies.  Prior to hospitalization, patient could complete BADL with modified independent .  Patient will benefit from skilled intervention to increase independence with basic self-care skills prior to discharge home with care partner.  Anticipate patient will require intermittent supervision and follow up home health.  OT - End of Session Endurance Deficit: Yes Endurance Deficit Description: Rest breaks within BADL Tasks OT Assessment Rehab Potential (ACUTE ONLY): Excellent OT Patient demonstrates impairments in the following area(s): Balance;Endurance;Motor OT Basic ADL's Functional Problem(s): Grooming;Bathing;Dressing;Toileting OT Transfers Functional Problem(s): Tub/Shower;Toilet OT Additional Impairment(s): None OT Plan OT Intensity: Minimum of 1-2 x/day, 45 to 90 minutes OT Frequency: 5 out of 7 days OT Duration/Estimated Length of Stay: 10-12 days OT Treatment/Interventions: Medical illustrator training;Community reintegration;Discharge planning;DME/adaptive equipment instruction;Pain management;Patient/family education;Self Care/advanced ADL retraining;Therapeutic Activities;Therapeutic Exercise;UE/LE Strength taining/ROM;UE/LE Coordination activities OT Basic Self-Care Anticipated Outcome(s): Mod  I/supervision OT Toileting Anticipated Outcome(s): mod I OT Bathroom Transfers Anticipated Outcome(s): supervision OT Recommendation Patient destination: Home Follow Up Recommendations: Home health OT Equipment Recommended: To be determined   OT Evaluation Precautions/Restrictions  Precautions Precautions: Fall Restrictions Weight Bearing Restrictions: No Pain  Pt reports 4/10 abdominal pain. Reports pain is manageable.  Home Living/Prior Functioning Home Living Family/patient expects to be discharged to:: Private residence Living Arrangements: Alone Available Help at Discharge: Family, Available 24 hours/day, Available PRN/intermittently (availabel 24/7 for a few days at d/c) Type of Home: House Home Access: Stairs to enter [employer] of Steps: 2 front, 3 in back Entrance Stairs-Rails: Can reach both Home Layout: One level Bathroom Shower/Tub: Gaffer, Chiropodist: Handicapped height Bathroom Accessibility: Yes Additional Comments: Has been using tub shower with tub bench  Lives With: Alone (daughter will stay with pt) IADL History IADL Comments: Pt was back to cooking meals and doing chores around home Prior Function Level of Independence: Independent with basic ADLs, Independent with homemaking with ambulation, Independent with gait, Independent with transfers, Requires assistive device for independence  Able to Take Stairs?: Yes Driving: No (Not since her hip fx earlier this year) Cognition Overall Cognitive Status: Within Functional Limits for tasks assessed Arousal/Alertness: Awake/alert Orientation Level: Person;Situation;Place Person: Oriented Place: Oriented Situation: Oriented Year: 2021 Month: August Day of Week: Correct Memory: Appears intact Immediate Memory Recall: Sock;Blue;Bed Memory Recall Sock: Without Cue Memory Recall Blue: Without Cue Memory Recall Bed: Without Cue Awareness: Appears intact Problem  Solving: Appears intact Safety/Judgment: Appears intact Sensation Sensation Light Touch: Appears Intact Hot/Cold: Appears Intact Proprioception:  Appears Intact Coordination Gross Motor Movements are Fluid and Coordinated: No Fine Motor Movements are Fluid and Coordinated: Yes Coordination and Movement Description: generalized deconditioning and decreased postural control Heel Shin Test: limted on R due to decreased R hip ROM Motor  Motor Motor - Skilled Clinical Observations: slow and generalized weakness  Trunk/Postural Assessment  Cervical Assessment Cervical Assessment: Exceptions to [facility] (forward head) Thoracic Assessment Thoracic Assessment: Exceptions to [facility] (rounded shoulders) Lumbar Assessment Lumbar Assessment: Exceptions to [facility] (posterior pelvic tilt) Postural Control Postural Control: Deficits on evaluation (decreased/delayed)  Balance Balance Balance Assessed: Yes Static Sitting Balance Static Sitting - Balance Support: Feet supported Static Sitting - Level of Assistance: 5: Stand by assistance Dynamic Sitting Balance Dynamic Sitting - Balance Support: Feet supported Dynamic Sitting - Level of Assistance: 5: Stand by assistance Sitting balance - Comments: limited tolerance in unsupported sitting due to R hip tightness Static Standing Balance Static Standing - Balance Support: During functional activity Static Standing - Level of Assistance: 4: Min assist Dynamic Standing Balance Dynamic Standing - Balance Support: During functional activity Dynamic Standing - Level of Assistance: 4: Min assist Extremity/Trunk Assessment RUE Assessment RUE Assessment: Within Functional Limits LUE Assessment LUE Assessment: Within Functional Limits  Care Tool Care Tool Self Care Eating   Eating Assist Level: Set up assist    Oral Care    Oral Care Assist Level: Set up assist    Bathing   Body parts bathed by patient: Right arm;Left arm;Chest;Abdomen;Front perineal  area;Buttocks;Right upper leg;Left upper leg;Face Body parts bathed by helper: Right lower leg;Left lower leg   Assist Level: Minimal Assistance - Patient > 75%    Upper Body Dressing(including orthotics)   What is the patient wearing?: Bra;Pull over shirt   Assist Level: Minimal Assistance - Patient > 75%    Lower Body Dressing (excluding footwear)   What is the patient wearing?: Underwear/pull up;Pants Assist for lower body dressing: Moderate Assistance - Patient 50 - 74%    Putting on/Taking off footwear   What is the patient wearing?: Non-skid slipper socks Assist for footwear: Maximal Assistance - Patient 25 - 49%       Care Tool Toileting Toileting activity   Assist for toileting: Minimal Assistance - Patient > 75%     Care Tool Bed Mobility Roll left and right activity        Sit to lying activity        Lying to sitting edge of bed activity         Care Tool Transfers Sit to stand transfer   Sit to stand assist level: Moderate Assistance - Patient 50 - 74%    Chair/bed transfer         Toilet transfer   Assist Level: Minimal Assistance - Patient > 75%     Care Tool Cognition Expression of Ideas and Wants Expression of Ideas and Wants: Without difficulty (complex and basic) - expresses complex messages without difficulty and with speech that is clear and easy to understand   Understanding Verbal and Non-Verbal Content Understanding Verbal and Non-Verbal Content: Understands (complex and basic) - clear comprehension without cues or repetitions   Memory/Recall Ability *first 3 days only Memory/Recall Ability *first 3 days only: Location of own room;Current season;That he or she is in a hospital/hospital unit;Staff names and faces    Refer to Care Plan for Long Term Goals  SHORT TERM GOAL WEEK 1 OT Short Term Goal 1 (Week 1): LTG=STG 2/2 ELOS  Recommendations for other  services: None    Skilled Therapeutic Intervention .OT eval completed addressing  rehab process, OT purpose, POC, ELOS, and goals.  Skilled intervention as outlined in detail below. Pt ambulatory throughout session with CGA/min A and RW. She needed up to mod A to stand, but as little as min A w/ RW. Pt with continent void of bowl and bladder in commode with min A for balance when completing peri-care. BADL tasks completed at the sit<>stand at the sink with min/mod A for LB ADLs 2/2 limited ROM of R hip from old hip fx. Pt left seated in wc at end of session with alarm belt on, call bell in reach, and needs met.   ADL ADL Eating: Set up Grooming: Setup Upper Body Bathing: Setup Lower Body Bathing: Minimal assistance Upper Body Dressing: Minimal assistance Lower Body Dressing: Minimal assistance Toileting: Minimal assistance Toilet Transfer: Minimal assistance Mobility  Bed Mobility Bed Mobility: Supine to Sit Supine to Sit: Minimal Assistance - Patient > 75% Transfers Sit to Stand: Moderate Assistance - Patient 50-74% Stand to Sit: Minimal Assistance - Patient > 75%   Discharge Criteria: Patient will be discharged from OT if patient refuses treatment 3 consecutive times without medical reason, if treatment goals not met, if there is a change in medical status, if patient makes no progress towards goals or if patient is discharged from hospital.  The above assessment, treatment plan, treatment alternatives and goals were discussed and mutually agreed upon: by patient  Valma Cava 02/18/2020, 12:46 PM

## 2020-02-19 ENCOUNTER — Inpatient Hospital Stay (HOSPITAL_COMMUNITY): Payer: Medicare Other

## 2020-02-19 ENCOUNTER — Inpatient Hospital Stay (HOSPITAL_COMMUNITY): Payer: Medicare Other | Admitting: Occupational Therapy

## 2020-02-19 ENCOUNTER — Inpatient Hospital Stay (HOSPITAL_COMMUNITY): Payer: Medicare Other | Admitting: Physical Therapy

## 2020-02-19 DIAGNOSIS — E8809 Other disorders of plasma-protein metabolism, not elsewhere classified: Secondary | ICD-10-CM

## 2020-02-19 DIAGNOSIS — G8918 Other acute postprocedural pain: Secondary | ICD-10-CM

## 2020-02-19 DIAGNOSIS — E46 Unspecified protein-calorie malnutrition: Secondary | ICD-10-CM

## 2020-02-19 DIAGNOSIS — E871 Hypo-osmolality and hyponatremia: Secondary | ICD-10-CM

## 2020-02-19 LAB — CBC
HCT: 31.8 % — ABNORMAL LOW (ref 36.0–46.0)
Hemoglobin: 10 g/dL — ABNORMAL LOW (ref 12.0–15.0)
MCH: 28.6 pg (ref 26.0–34.0)
MCHC: 31.4 g/dL (ref 30.0–36.0)
MCV: 90.9 fL (ref 80.0–100.0)
Platelets: 456 10*3/uL — ABNORMAL HIGH (ref 150–400)
RBC: 3.5 MIL/uL — ABNORMAL LOW (ref 3.87–5.11)
RDW: 14.7 % (ref 11.5–15.5)
WBC: 7.4 10*3/uL (ref 4.0–10.5)
nRBC: 0 % (ref 0.0–0.2)

## 2020-02-19 LAB — PROTIME-INR
INR: 1.4 — ABNORMAL HIGH (ref 0.8–1.2)
Prothrombin Time: 16.6 seconds — ABNORMAL HIGH (ref 11.4–15.2)

## 2020-02-19 MED ORDER — WARFARIN 1.25 MG HALF TABLET
1.2500 mg | ORAL_TABLET | Freq: Once | ORAL | Status: AC
  Administered 2020-02-19: 1.25 mg via ORAL
  Filled 2020-02-19: qty 1

## 2020-02-19 NOTE — Progress Notes (Signed)
Physical Therapy Session Note  Patient Details  Name: Stephanie Cortez MRN: [id number] Date of Birth: 06-18-1934  Today's Date: 02/19/2020 PT Individual Time: 0800-0829 PT Individual Time Calculation (min): 29 min   Short Term Goals: Week 1:  PT Short Term Goal 1 (Week 1): Patient will perform bed mobility with supervision using bed rails to simulate home set up. PT Short Term Goal 2 (Week 1): Patient will perform basic transfers with supervision using LRAD. PT Short Term Goal 3 (Week 1): Patient will ambulate >150 ft using LRAD with supervision.  Skilled Therapeutic Interventions/Progress Updates:   Pt presents supine in bed and agrees to participate in therapy.  Pt transfers sup to sit w/ supervision and use of side rails and occasional verbal cues for scooting to EOB.  Pt required min A and verbal cueing for sit to stand transfer to RW.  Pt able to doff pants to floor and returned to sitting w/ min A for safety, verbal cues for use of UEs.  Pt required threading of pants over feet and then stood to pull up pants.  Pt transferred bed > w/c w/ RW and min A, short distance gait in room.  Pt stood at sink to brush hair.  Pt amb multiple trials w/ RW and CGA up to 100' including turns to return to seat.  Pt remained in w/c at conclusion of rx w/ chair alarm on and all needs in reach.     Therapy Documentation Precautions:  Precautions Precautions: Fall Restrictions Weight Bearing Restrictions: No General:   Vital Signs: Therapy Vitals Temp: 98.4 F (36.9 C) Pulse Rate: 87 Resp: 20 BP: 136/64 Patient Position (if appropriate): Lying Oxygen Therapy SpO2: 96 % O2 Device: Room Air Pain: pt states abdominal discomfort.      Therapy/Group: Individual Therapy  Ladoris Gene 02/19/2020, 8:34 AM

## 2020-02-19 NOTE — Progress Notes (Signed)
Occupational Therapy Session Note  Patient Details  Name: Stephanie Cortez MRN: [id number] Date of Birth: 28-Dec-1933  Today's Date: 02/19/2020 OT Individual Time: 0388-8280 OT Individual Time Calculation (min): 54 min    Short Term Goals: Week 1:  OT Short Term Goal 1 (Week 1): LTG=STG 2/2 ELOS  Skilled Therapeutic Interventions/Progress Updates:    Treatment session with focus on sit > stand, dynamic standing balance, and endurance during self-care tasks of bathing and dressing.  Pt received supine in bed reporting desire to bathe during this session. Pt completed bed mobility supervision, however required min assist to stand from EOB with cues for hand placement.  Completed short distance ambulation with RW to sink with CGA.  Engaged in bathing at sit > stand level with pt completing remainder of sit > stands at supervision level.  CGA for dynamic standing as pt maintained standing for prolonged period while washing LB and perineal area.  Completed LB dressing with mod assist and cues for donning Rt side first due to h/o hip fx.  Pt reports having reacher for LB dressing at home.  Pt remained upright in w/c with seat belt alarm on and all needs in reach.  Therapy Documentation Precautions:  Precautions Precautions: Fall Restrictions Weight Bearing Restrictions: No Pain:  Pt with no c/o pain   Therapy/Group: Individual Therapy  Simonne Come 02/19/2020, 3:19 PM

## 2020-02-19 NOTE — Progress Notes (Signed)
[place] for warfarin Indication: atrial fibrillation  Allergies  Allergen Reactions  . Doxycycline Other (See Comments)    Rash on face and neck  . Vicodin [Hydrocodone-Acetaminophen]     Hallucinations   . Flagyl [Metronidazole] Nausea And Vomiting  . Moxifloxacin Other (See Comments)     Weakness/fatigue  . Pantoprazole Sodium Rash  . Penicillins Rash    Has patient had a PCN reaction causing immediate rash, facial/tongue/throat swelling, SOB or lightheadedness with hypotension:unsure Has patient had a PCN reaction causing severe rash involving mucus membranes or skin necrosis:unsure Has patient had a PCN reaction that required hospitalization:No Has patient had a PCN reaction occurring within the last 10 years:Yes Cannot recall exact reaction due to time lapse If all of the above answers are "NO", then may proceed with Cephalosporin use.     Patient Measurements: Height: 5\' 6"  (167.6 cm) Weight: 73.1 kg (161 lb 2.5 oz) IBW/kg (Calculated) : 59.3  Vital Signs: Temp: 98.4 F (36.9 C) (08/20 0519) BP: 136/64 (08/20 0519) Pulse Rate: 87 (08/20 0519)  Labs: Recent Labs    02/17/20 0833 02/17/20 0833 02/18/20 0913 02/19/20 0749  HGB 9.6*   < > 10.0* 10.0*  HCT 29.9*  --  32.0* 31.8*  PLT 399  --  416* 456*  LABPROT 16.8*  --  16.7* 16.6*  INR 1.4*  --  1.4* 1.4*  CREATININE  --   --  0.48  --    < > = values in this interval not displayed.    Estimated Creatinine Clearance: 53.6 mL/min (by C-G formula based on SCr of 0.48 mg/dL).   Medical History: Past Medical History:  Diagnosis Date  . Anemia    - Hgb 9.7gm% on 07/13/2008 in [place] -  Hgg 129gm% wiht normal irone levsl and ferritin 10/27/2008 in [place] Recurrent otitis/sinusitis  . Anxiety    chronic BZ prn  . Atrial fibrillation (HCC)    chronic anticoag  . Bronchiectasis    >PFT 07/13/2008 in [place] 1.9L/76%, FVC 2.45L/74, Ratio 79, TLC 121%, DLCO 64%  AE  BRonchiectasis - Dec 2010.New Rx:  outpatient - Feb 2011 - Rx outpatient  . CHF (congestive heart failure) ([place])   . COPD (chronic obstructive pulmonary disease) (H[facility])    bronchiectasis  . Cricopharyngeal achalasia   . Depression   . Diverticulosis   . Dyslipidemia   . Eczema   . GERD with stricture   . Glaucoma   . H. pylori infection   . Hypertension   . Hyponatremia    chronic, s/p endo eval 06/2012  . Hypothyroid   . IBS (irritable bowel syndrome)   . Lumbar disc disease   . Neuropathy of both feet   . Segmental colitis ([place])   . Vertigo   . Wears glasses     Medications:  Scheduled:  . (feeding supplement) PROSource Plus  30 mL Oral BID BM  . diltiazem  60 mg Oral Q6H  . docusate sodium  100 mg Oral BID  . enoxaparin (LOVENOX) injection  40 mg Subcutaneous Q24H  . famotidine  20 mg Oral Daily  . feeding supplement  1 Container Oral TID BM  . FLUoxetine  10 mg Oral QHS  . levothyroxine  50 mcg Oral Q0600  . methocarbamol  500 mg Oral BID  . mometasone-formoterol  2 puff Inhalation BID  . polyethylene glycol  17 g Oral Daily  . pravastatin  40 mg Oral q1800  .  Warfarin - Pharmacist Dosing Inpatient   Does not apply q1600   Infusions:   PRN: acetaminophen **OR** acetaminophen, melatonin, ondansetron **OR** ondansetron (ZOFRAN) IV Anti-infectives (From admission, onward)   None      Assessment: 86 yo female with a history of permanent atrial fibrillation presents with abdominal pain and coffee-ground emesis. The patient was found to have small bowel ischemia and underwent exploratory laparotomy, lysis of adhesions, small bowel resection and gastrostomy tube placementon 02/08/20. The patient's INR upon admit was supratherapeutic at 3.3 and the patient received KCentra 1625 units once prior to surgery.  She is on warfarin PTA for afib and pharmacy consulted to restart on 8/15. PO intake increased over the last week, including liquid supplements.  INR remains  SUBtherapeutic at 1.4. Hgb stable. No s/sx of bleeding noted.   PTA: In April, was on warfarin 2.5 mg  TTSS and 1.25 mg MWF However, daughter endorses that she has been therapeutic on 1.25 mg daily for the last 6 weeks (dosed by home health)  Goal of Therapy:  INR 2-3 Monitor platelets by anticoagulation protocol: Yes   Plan:  Warfarin 1.25 mg x 1 PO tonight Continue Enoxaparin 40 mg SubQ q24h until INR is therapeutic  Daily PT/INR CBC to MWF only Monitor for s/sx of bleeding, PO intake, Drug Interactions   Thank you for allowing [place] to participate in this patients care.   Jens Som, PharmD [phone] until 03:30 pm, then see amion for appropriate number for unit. 02/19/2020 1:41 PM

## 2020-02-19 NOTE — Plan of Care (Signed)
  Problem: Consults Goal: RH GENERAL PATIENT EDUCATION Description: See Patient Education module for education specifics. Outcome: Progressing   Problem: RH BOWEL ELIMINATION Goal: RH STG MANAGE BOWEL WITH ASSISTANCE Description: STG Manage Bowel with min Assistance. Outcome: Progressing Goal: RH STG MANAGE BOWEL W/MEDICATION W/ASSISTANCE Description: STG Manage Bowel with Medication with mod I  Assistance. Outcome: Progressing   Problem: RH BLADDER ELIMINATION Goal: RH STG MANAGE BLADDER WITH ASSISTANCE Description: STG Manage Bladder With min Assistance Outcome: Progressing   Problem: RH SKIN INTEGRITY Goal: RH STG SKIN FREE OF INFECTION/BREAKDOWN Description: Skin will be free of infection/breakdown with min assist Outcome: Progressing Goal: RH STG MAINTAIN SKIN INTEGRITY WITH ASSISTANCE Description: STG Maintain Skin Integrity With min Assistance. Outcome: Progressing Goal: RH STG ABLE TO PERFORM INCISION/WOUND CARE W/ASSISTANCE Description: STG Able To Perform Incision/Wound Care With min Assistance. Outcome: Progressing   Problem: RH SAFETY Goal: RH STG ADHERE TO SAFETY PRECAUTIONS W/ASSISTANCE/DEVICE Description: STG Adhere to Safety Precautions With min Assistance/Device. Outcome: Progressing   Problem: RH PAIN MANAGEMENT Goal: RH STG PAIN MANAGED AT OR BELOW PT'S PAIN GOAL Description: Pain will be managed at or below 3 out of 10 with min assist Outcome: Progressing   Problem: RH KNOWLEDGE DEFICIT GENERAL Goal: RH STG INCREASE KNOWLEDGE OF SELF CARE AFTER HOSPITALIZATION Description: Pt will be able to verbalize self care while at home with min assist Outcome: Progressing

## 2020-02-19 NOTE — Progress Notes (Signed)
Physical Therapy Session Note  Patient Details  Name: Stephanie Cortez MRN: [id number] Date of Birth: [birth year]  Today's Date: 02/19/2020 PT Individual Time: 0900-0959 PT Individual Time Calculation (min): 59 min   Short Term Goals: Week 1:  PT Short Term Goal 1 (Week 1): Patient will perform bed mobility with supervision using bed rails to simulate home set up. PT Short Term Goal 2 (Week 1): Patient will perform basic transfers with supervision using LRAD. PT Short Term Goal 3 (Week 1): Patient will ambulate >150 ft using LRAD with supervision.  Skilled Therapeutic Interventions/Progress Updates:   Received pt sitting in WC, pt agreeable to therapy, and reported pain 7/10 in R hip and in abdomen. RN made aware and administered pain medication during session. Session with emphasis on functional mobility/transfers, generalized strengthening, dynamic standing balance/coordination, ambulation, and improved activity tolerance. Pt transported to therapy gym in [facility] total A and ambulated 34ft with RW and CGA/min A with cues for upright posture and forward gaze. Pt ambulated 50ft with RW CGA to // bars and performed forward/backward tandem walking for 56ft x 2 trials with bilateral UE support on // bars with CGA. Pt required multiple rest breaks throughout session due to increased fatigue. Pt transferred sit<>stand min A and performed lateral side stepping inside // bars x 38ft with bilateral UE support on // bars and CGA. O2 sat 86% on RA increased to 91% in <30 seconds with cues for pursed lip breathing techniques. Due to increased fatigue, pt performed the following exercises seated in [facility] with supervision and verbal cues for technique without back support for increased core engagement: -horizontal chest press with 2lb dowel 2x10 -bicep curls with 2lb dowel 2x10 -hip abduction with grn TB 1x8 Pt reported urge to have BM and was transported back to room in [facility] total A and ambulated 27ft with RW CGA to  bathroom. Pt able to manage clothing with CGA and able to void but ultimately unsuccessful with BM. Pt transferred sit<>stand with RW and min A and required total A for peri-care and max A to pull pants over hips for time management purposes. Stand<>pivot toilet<>WC with RW CGA. Pt requested to return to bed due to increased fatigue. Stand<>pivot WC<>bed without AD min A and sit<>supine with min A. Concluded session with pt supine in bed, needs within reach, and bed alarm on.    Therapy Documentation Precautions:  Precautions Precautions: Fall Restrictions Weight Bearing Restrictions: No   Therapy/Group: Individual Therapy Alfonse Alpers PT, DPT   02/19/2020, 7:24 AM

## 2020-02-19 NOTE — Progress Notes (Signed)
Physical Therapy Session Note  Patient Details  Name: Stephanie Cortez MRN: [id number] Date of Birth: 08-17-1933  Today's Date: 02/19/2020 PT Individual Time: 1100-1158 PT Individual Time Calculation (min): 58 min   Short Term Goals: Week 1:  PT Short Term Goal 1 (Week 1): Patient will perform bed mobility with supervision using bed rails to simulate home set up. PT Short Term Goal 2 (Week 1): Patient will perform basic transfers with supervision using LRAD. PT Short Term Goal 3 (Week 1): Patient will ambulate >150 ft using LRAD with supervision.  Skilled Therapeutic Interventions/Progress Updates:    Patient received supine in bed sleeping, but agreeable to PT. She reports that her pain rx from earlier has helped minimize her pain. Patient able to ambulate 227ft with FWW and CGA before requiring seated rest break. She is able to remain standing for roughly 2 mins at a time before requiring extended seated rest break. Mild SOB noted upon sitting, but patient able to control via PLB. Patient demonstrating Fair dynamic standing balance requiring UE support on FWW to complete UE task crossing midline. Patient able to complete 2x5 mins on NuStep for increased endurance and general reciprocal coordination. Patient able to remain standing on unstable foam surface for 1'30" at a time with intermittent UE support and CGA. Patient able to ambulate 13ft with FWW + CGA prior to needing to sit. Patient returned to bed at end of session, bed alarm on, call light within reach. Patient fatigued, but declines pain.   Therapy Documentation Precautions:  Precautions Precautions: Fall Restrictions Weight Bearing Restrictions: No    Therapy/Group: Individual Therapy  Karoline Caldwell, PT, DPT, CBIS 02/19/2020, 11:23 AM

## 2020-02-19 NOTE — Progress Notes (Signed)
[place] PHYSICAL MEDICINE & REHABILITATION PROGRESS NOTE  Subjective/Complaints: Patient seen sitting up in a chair this morning.  She states she slept well overnight.  She states she had a good first day of therapies yesterday.  She has questions regarding discharge follow-up appointments.  ROS: Denies CP, SOB, N/V/D  Objective: Vital Signs: Blood pressure 136/64, pulse 87, temperature 98.4 F (36.9 C), resp. rate 20, height 5\' 6"  (1.676 m), weight 73.1 kg, SpO2 96 %. No results found. Recent Labs    02/18/20 0913 02/19/20 0749  WBC 8.2 7.4  HGB 10.0* 10.0*  HCT 32.0* 31.8*  PLT 416* 456*   Recent Labs    02/18/20 0913  NA 132*  K 4.1  CL 100  CO2 24  GLUCOSE 109*  BUN <5*  CREATININE 0.48  CALCIUM 8.8*    Physical Exam: BP 136/64 (BP Location: Left Arm)   Pulse 87   Temp 98.4 F (36.9 C)   Resp 20   Ht 5\' 6"  (1.676 m)   Wt 73.1 kg   LMP  (LMP Unknown)   SpO2 96%   BMI 26.01 kg/m  Constitutional: No distress . Vital signs reviewed. HENT: Normocephalic.  Atraumatic. Eyes: EOMI. No discharge. Cardiovascular: No JVD.  Irregularly irregular. Respiratory: Normal effort.  No stridor.  Bilateral clear to auscultation. GI: Non-distended.  Abdominal tenderness.  BS +. Skin: Abdominal incision with staples CDI.  G-tube in place Left neck with healing incision Psych: Normal mood.  Normal behavior. Musc: No edema in extremities.  No tenderness in extremities. Neuro: Alert Motor: Bilateral upper extremities, left lower extremity: 5/5 proximal distally Right lower extremity: 4+/5 proximally, 5/5 distally, unchanged  Assessment/Plan: 1. Functional deficits secondary to debility which require 3+ hours per day of interdisciplinary therapy in a comprehensive inpatient rehab setting.  Physiatrist is providing close team supervision and 24 hour management of active medical problems listed below.  Physiatrist and rehab team continue to assess barriers to  discharge/monitor patient progress toward functional and medical goals  Care Tool:  Bathing    Body parts bathed by patient: Right arm, Left arm, Chest, Abdomen, Front perineal area, Buttocks, Right upper leg, Left upper leg, Face   Body parts bathed by helper: Right lower leg, Left lower leg     Bathing assist Assist Level: Minimal Assistance - Patient > 75%     Upper Body Dressing/Undressing Upper body dressing   What is the patient wearing?: Pull over shirt    Upper body assist Assist Level: Minimal Assistance - Patient > 75%    Lower Body Dressing/Undressing Lower body dressing      What is the patient wearing?: Pants     Lower body assist Assist for lower body dressing: Minimal Assistance - Patient > 75%     Toileting Toileting    Toileting assist Assist for toileting: Minimal Assistance - Patient > 75%     Transfers Chair/bed transfer  Transfers assist     Chair/bed transfer assist level: Moderate Assistance - Patient 50 - 74% Chair/bed transfer assistive device: Museum/gallery exhibitions officer assist      Assist level: Contact Guard/Touching assist Assistive device: Walker-rolling Max distance: 100   Walk 10 feet activity   Assist     Assist level: Contact Guard/Touching assist Assistive device: Walker-rolling   Walk 50 feet activity   Assist    Assist level: Contact Guard/Touching assist Assistive device: Walker-rolling    Walk 150 feet activity   Assist  Assist level: Contact Guard/Touching assist Assistive device: Walker-rolling    Walk 10 feet on uneven surface  activity   Assist Walk 10 feet on uneven surfaces activity did not occur: Safety/medical concerns (decreased strength/activity tolerance)         Wheelchair     Assist   Type of Wheelchair: Manual    Wheelchair assist level: Supervision/Verbal cueing Max wheelchair distance: >150 ft    Wheelchair 50 feet with 2 turns  activity    Assist        Assist Level: Supervision/Verbal cueing   Wheelchair 150 feet activity     Assist     Assist Level: Supervision/Verbal cueing      Medical Problem List and Plan: 1.    Debility secondary to small bowel ischemia status post exploratory laparotomy lysis of adhesions small bowel resection gastrostomy tube placement 02/08/2020  Continue CIR 2.  Antithrombotics: -DVT/anticoagulation: Chronic Coumadin for history of atrial fibrillation             INR subtherapeutic on 8/20             -antiplatelet therapy: N/A 3. Pain Management: Robaxin as needed  Controlled with meds on 8/20  Monitor with increased exertion 4. Mood: Prozac 10 mg daily             -antipsychotic agents: N/A 5. Neuropsych: This patient is capable of making decisions on her own behalf. 6. Skin/Wound Care: Routine skin checks 7. Fluids/Electrolytes/Nutrition: Routine in and outs. 8.  Acute blood loss anemia.               Hemoglobin 10.0 on 8/20 9.  Atrial fibrillation.  Continue Cardizem 60 mg every 6 hours.  Chronic Coumadin resumed             Rate controlled on 8/20  Monitor with increased exertion. 10.  Hypothyroidism.  Synthroid 11.  Hypoalbuminemia  Supplement initiated  12.  Hyponatremia  Sodium 132 on 8/19, labs ordered for Monday  Continue to monitor   LOS: 2 days A FACE TO FACE EVALUATION WAS PERFORMED  Tamy Accardo [name] 02/19/2020, 9:10 AM

## 2020-02-19 NOTE — Progress Notes (Addendum)
Patient ID: Stephanie Cortez, female   DOB: 08/29/1933, 86 y.o.   MRN: [id number] Met with the patient to review role of the nurse CM and collaboration with SW (Becky) to facilitate preparations for discharge. Reviewed hx of colitis, copd, HF, Afib and HTN, HLD etc. Reports feels like she has a good understanding of all of her health issues and also had planned a bronch with pulmonologist and will follow up on it after discharge. The bowel ischemia/hospitalization caught her off guard. Asking about removal of staples (order placed for removal per MD today) and g-tube removal. Reviewed dressing to abd and diet. No other concerns noted at present. Just notes she is tired and does not have a lot of reserve. The full therapy schedule was a little too much for her today. Left resting in the bed. ,  B   

## 2020-02-20 LAB — PROTIME-INR
INR: 1.5 — ABNORMAL HIGH (ref 0.8–1.2)
Prothrombin Time: 17.6 seconds — ABNORMAL HIGH (ref 11.4–15.2)

## 2020-02-20 MED ORDER — WARFARIN SODIUM 1 MG PO TABS
1.5000 mg | ORAL_TABLET | Freq: Once | ORAL | Status: AC
  Administered 2020-02-20: 1.5 mg via ORAL
  Filled 2020-02-20: qty 1

## 2020-02-20 NOTE — Progress Notes (Signed)
ANTICOAGULATION CONSULT NOTE  Pharmacy Consult for warfarin Indication: atrial fibrillation  Patient Measurements: Height: 5\' 6"  (167.6 cm) Weight: 73.1 kg (161 lb 2.5 oz) IBW/kg (Calculated) : 59.3  Vital Signs: Temp: 98.3 F (36.8 C) (08/21 1328) BP: 118/57 (08/21 1328) Pulse Rate: 84 (08/21 1328)  Labs: Recent Labs    02/18/20 0913 02/19/20 0749 02/20/20 0517  HGB 10.0* 10.0*  --   HCT 32.0* 31.8*  --   PLT 416* 456*  --   LABPROT 16.7* 16.6* 17.6*  INR 1.4* 1.4* 1.5*  CREATININE 0.48  --   --     Estimated Creatinine Clearance: 53.6 mL/min (by C-G formula based on SCr of 0.48 mg/dL).  Assessment: 86 yo female with a history of permanent atrial fibrillation presents with abdominal pain and coffee-ground emesis. The patient was found to have small bowel ischemia and underwent exploratory laparotomy, lysis of adhesions, small bowel resection and gastrostomy tube placementon 02/08/20. The patient's INR upon admit was supratherapeutic at 3.3 and the patient received KCentra 1625 units once prior to surgery.  She is on warfarin PTA for afib and pharmacy consulted to restart on 8/15. PO intake increased over the last week, including liquid supplements.  INR remains subtherapeutic today at 1.5. Hgb stable. No s/sx of bleeding noted.   PTA: In April, was on warfarin 2.5 mg TTSS and 1.25 mg MWF However, daughter endorses that she has been therapeutic on 1.25 mg daily for the last 6 weeks (dosed by home health)  Goal of Therapy:  INR 2-3 Monitor platelets by anticoagulation protocol: Yes   Plan:  Warfarin 1.5 mg x 1 PO tonight Continue Enoxaparin 40 mg SubQ q24h until INR is therapeutic  Daily PT/INR CBC qMWF  Monitor for s/sx of bleeding, PO intake, Drug Interactions   Thank you for allowing pharmacy to participate in this patient's care.    Rebbeca Paul, PharmD PGY1 Pharmacy Resident 02/20/2020 1:30 PM  Please check [email or web address] for unit-specific pharmacy phone  numbers.

## 2020-02-20 NOTE — Progress Notes (Signed)
[place] PHYSICAL MEDICINE & REHABILITATION PROGRESS NOTE  Subjective/Complaints: Up walking back from bathroom. Had bm.   ROS: Patient denies fever, rash, sore throat, blurred vision, nausea, vomiting, diarrhea, cough, shortness of breath or chest pain, joint or back pain, headache, or mood change.    Objective: Vital Signs: Blood pressure 129/61, pulse 86, temperature 97.9 F (36.6 C), resp. rate 15, height 5\' 6"  (1.676 m), weight 73.1 kg, SpO2 95 %. No results found. Recent Labs    02/18/20 0913 02/19/20 0749  WBC 8.2 7.4  HGB 10.0* 10.0*  HCT 32.0* 31.8*  PLT 416* 456*   Recent Labs    02/18/20 0913  NA 132*  K 4.1  CL 100  CO2 24  GLUCOSE 109*  BUN <5*  CREATININE 0.48  CALCIUM 8.8*    Physical Exam: BP 129/61 (BP Location: Left Arm)   Pulse 86   Temp 97.9 F (36.6 C)   Resp 15   Ht 5\' 6"  (1.676 m)   Wt 73.1 kg   LMP  (LMP Unknown)   SpO2 95%   BMI 26.01 kg/m  Constitutional: No distress . Vital signs reviewed. HEENT: EOMI, oral membranes moist Neck: supple Cardiovascular: RRR without murmur. No JVD    Respiratory/Chest: CTA Bilaterally without wheezes or rales. Normal effort    GI/Abdomen: BS +, slightly-tender, non-distended Ext: no clubbing, cyanosis, or edema Psych: pleasant and cooperative Skin: Abdominal incision with staples, g-tube Left neck with healing incision Psych: Normal mood.  Normal behavior. Musc: No edema in extremities.  No tenderness in extremities. Neuro: Alert Motor: Bilateral upper extremities, left lower extremity: 5/5 proximal distally Right lower extremity: 4+/5 proximally, 5/5 distally, unchanged  Assessment/Plan: 1. Functional deficits secondary to debility which require 3+ hours per day of interdisciplinary therapy in a comprehensive inpatient rehab setting.  Physiatrist is providing close team supervision and 24 hour management of active medical problems listed below.  Physiatrist and rehab team continue to  assess barriers to discharge/monitor patient progress toward functional and medical goals  Care Tool:  Bathing    Body parts bathed by patient: Right arm, Left arm, Chest, Abdomen, Front perineal area, Buttocks, Right upper leg, Left upper leg, Face   Body parts bathed by helper: Right lower leg, Left lower leg     Bathing assist Assist Level: Contact Guard/Touching assist     Upper Body Dressing/Undressing Upper body dressing   What is the patient wearing?: Pull over shirt    Upper body assist Assist Level: Set up assist    Lower Body Dressing/Undressing Lower body dressing      What is the patient wearing?: Underwear/pull up, Pants     Lower body assist Assist for lower body dressing: Total Assistance - Patient < 25%     Toileting Toileting    Toileting assist Assist for toileting: Minimal Assistance - Patient > 75%     Transfers Chair/bed transfer  Transfers assist     Chair/bed transfer assist level: Minimal Assistance - Patient > 75% Chair/bed transfer assistive device: Museum/gallery exhibitions officer assist      Assist level: Contact Guard/Touching assist Assistive device: Walker-rolling Max distance: 20ft   Walk 10 feet activity   Assist     Assist level: Contact Guard/Touching assist Assistive device: Walker-rolling   Walk 50 feet activity   Assist    Assist level: Contact Guard/Touching assist Assistive device: Walker-rolling    Walk 150 feet activity   Assist    Assist  level: Contact Guard/Touching assist Assistive device: Walker-rolling    Walk 10 feet on uneven surface  activity   Assist Walk 10 feet on uneven surfaces activity did not occur: Safety/medical concerns (decreased strength/activity tolerance)         Wheelchair     Assist   Type of Wheelchair: Manual    Wheelchair assist level: Supervision/Verbal cueing Max wheelchair distance: >150 ft    Wheelchair 50 feet with 2 turns  activity    Assist        Assist Level: Supervision/Verbal cueing   Wheelchair 150 feet activity     Assist     Assist Level: Supervision/Verbal cueing     BP 129/61 (BP Location: Left Arm)   Pulse 86   Temp 97.9 F (36.6 C)   Resp 15   Ht 5\' 6"  (1.676 m)   Wt 73.1 kg   LMP  (LMP Unknown)   SpO2 95%   BMI 26.01 kg/m   Medical Problem List and Plan: 1.    Debility secondary to small bowel ischemia status post exploratory laparotomy lysis of adhesions small bowel resection gastrostomy tube placement 02/08/2020  Continue CIR PT, OT 2.  Antithrombotics: -DVT/anticoagulation: Chronic Coumadin for history of atrial fibrillation             INR subtherapeutic on 8/21             -antiplatelet therapy: N/A 3. Pain Management: Robaxin as needed  Controlled with meds on 8/20  Continue current meds 4. Mood: Prozac 10 mg daily             -antipsychotic agents: N/A 5. Neuropsych: This patient is capable of making decisions on her own behalf. 6. Skin/Wound Care: Routine skin checks 7. Fluids/Electrolytes/Nutrition: Routine in and outs. 8.  Acute blood loss anemia.               Hemoglobin 10.0 on 8/20 9.  Atrial fibrillation.  Continue Cardizem 60 mg every 6 hours.  Chronic Coumadin resumed             Rate controlled on 8/21 in 80's  Monitor with increased exertion. 10.  Hypothyroidism.  Synthroid 11.  Hypoalbuminemia  Supplement initiated  12.  Hyponatremia  Sodium 132 on 8/19, labs ordered for Monday  Continue to monitor   LOS: 3 days A FACE TO [place] 02/20/2020, 8:25 AM

## 2020-02-21 ENCOUNTER — Inpatient Hospital Stay (HOSPITAL_COMMUNITY): Payer: Medicare Other | Admitting: Physical Therapy

## 2020-02-21 ENCOUNTER — Inpatient Hospital Stay (HOSPITAL_COMMUNITY): Payer: Medicare Other | Admitting: Occupational Therapy

## 2020-02-21 DIAGNOSIS — R195 Other fecal abnormalities: Secondary | ICD-10-CM

## 2020-02-21 LAB — PROTIME-INR
INR: 1.5 — ABNORMAL HIGH (ref 0.8–1.2)
Prothrombin Time: 17.2 seconds — ABNORMAL HIGH (ref 11.4–15.2)

## 2020-02-21 MED ORDER — WARFARIN 1.25 MG HALF TABLET
1.7500 mg | ORAL_TABLET | Freq: Once | ORAL | Status: AC
  Administered 2020-02-21: 1.75 mg via ORAL
  Filled 2020-02-21: qty 1

## 2020-02-21 NOTE — Progress Notes (Signed)
Physical Therapy Session Note  Patient Details  Name: Stephanie Cortez MRN: [id number] Date of Birth: 1933-09-15  Today's Date: 02/21/2020 PT Individual Time: 1747-1595 PT Individual Time Calculation (min): 46 min   Short Term Goals: Week 1:  PT Short Term Goal 1 (Week 1): Patient will perform bed mobility with supervision using bed rails to simulate home set up. PT Short Term Goal 2 (Week 1): Patient will perform basic transfers with supervision using LRAD. PT Short Term Goal 3 (Week 1): Patient will ambulate >150 ft using LRAD with supervision.  Skilled Therapeutic Interventions/Progress Updates:  Pt was seen bedside in the pm. Pt performed bed mobility with S and side rail with increased time. Pt performed multiple sit to stand transfers with S and rolling walker with increased time. Pt performed toilet transfers with c/g. Pt ambulated 150 feet x 2 with rolling walker and c/g. In gym treatment focused on LE strengthening, performed step taps and alternating step taps, 3 sets x 10 reps each. Following treatment pt left sitting up in bed with all needs within reach and bed alarm on.   Therapy Documentation Precautions:  Precautions Precautions: Fall Restrictions Weight Bearing Restrictions: No General:   Vital Signs: Therapy Vitals Temp: 97.9 F (36.6 C) Pulse Rate: 90 Resp: 20 BP: 114/69 Patient Position (if appropriate): Lying Oxygen Therapy SpO2: 98 % O2 Device: Room Air Pain: No c/o pain.   Therapy/Group: Individual Therapy  Dub Amis 02/21/2020, 3:15 PM

## 2020-02-21 NOTE — Progress Notes (Signed)
Occupational Therapy Session Note  Patient Details  Name: Stephanie Cortez MRN: [id number] Date of Birth: [birth year]  Today's Date: 02/21/2020 OT Individual Time: 1552-0802 and 2336-1224 OT Individual Time Calculation (min): 59 min and 29 min   Short Term Goals: Week 1:  OT Short Term Goal 1 (Week 1): LTG=STG 2/2 ELOS  Skilled Therapeutic Interventions/Progress Updates:    Session 1: Pt greeted at time of session reclined in bed bed agreeable to OT session, no c/o pain throughout. Supine to sit EOB supervision with extended time and SPT bed to wheelchair with CGA/Min without RW and just use of bed rails and wheelchair armrests for support. Set up at sink level to perform bathing, supervision for UB and CGA for LB (declined washing feet/below knee level today) but discussed AE options. UB dress including bra with clasps with Supervision, LB dress Min only to thread, pt thinks she still has hip precautions from surgery in May 2021, recommended ask MD. Pt able to static stand to don over hips in standing with Min/CGA for standing balance. Extended time for all tasks with rest breaks as pt does have SOB, O2 sats remained 95% or higher throughout session. Once dressed, SPT back to bed with CGA with RW and sit to supine in the same manner. Call bell in reach, alarm on.   Session 2: Pt greeted at time of session reclined in bed agreeable to OT session, needing to use the bathroom. Ambulated with RW to bathroom with CGA and transferred in the same manner, Min for clothing management and extended time on toilet d/t potential BM and hygiene tasks. Cues for hand/foot placement throughout and work simplification. Ambulated back to bed CGA with RW, sit to supine supervision. Wanted to change shirt d/t spillage from lunch and to remove bra, performed all with supervision with cues for ocassional problem solving. Discussed POC and OT goals, pt agreeable to plan. Alarm on, call bell in reach.    Therapy  Documentation Precautions:  Precautions Precautions: Fall Restrictions Weight Bearing Restrictions: No     Therapy/Group: Individual Therapy  Viona Gilmore 02/21/2020, 10:13 AM

## 2020-02-21 NOTE — Progress Notes (Signed)
[facility] PHYSICAL MEDICINE & REHABILITATION PROGRESS NOTE  Subjective/Complaints: Notes that she is having frequent loose stools.  Would like to back of bowel meds  ROS: Patient denies fever, rash, sore throat, blurred vision, nausea, vomiting, diarrhea, cough, shortness of breath or chest pain, joint or back pain, headache, or mood change.    Objective: Vital Signs: Blood pressure (!) 112/48, pulse 83, temperature 97.8 F (36.6 C), temperature source Oral, resp. rate 20, height 5\' 6"  (1.676 m), weight 73.1 kg, SpO2 95 %. No results found. Recent Labs    02/18/20 0913 02/19/20 0749  WBC 8.2 7.4  HGB 10.0* 10.0*  HCT 32.0* 31.8*  PLT 416* 456*   Recent Labs    02/18/20 0913  NA 132*  K 4.1  CL 100  CO2 24  GLUCOSE 109*  BUN <5*  CREATININE 0.48  CALCIUM 8.8*    Physical Exam: BP (!) 112/48   Pulse 83   Temp 97.8 F (36.6 C) (Oral)   Resp 20   Ht 5\' 6"  (1.676 m)   Wt 73.1 kg   LMP  (LMP Unknown)   SpO2 95%   BMI 26.01 kg/m  Constitutional: No distress . Vital signs reviewed. HEENT: EOMI, oral membranes moist Neck: supple Cardiovascular: RRR without murmur. No JVD    Respiratory/Chest: CTA Bilaterally without wheezes or rales. Normal effort    GI/Abdomen: BS +, non-tender, non-distended Ext: no clubbing, cyanosis, or edema Psych: pleasant and cooperative Skin: Abdominal incision with staples, g-tube in place Left neck with healing incision Psych: Normal mood.  Normal behavior. Musc: No edema in extremities.  No tenderness in extremities. Neuro: Alert Motor: Bilateral upper extremities, left lower extremity: 5/5 proximal distally Right lower extremity: 4+/5 proximally, 5/5 distally, unchanged  Assessment/Plan: 1. Functional deficits secondary to debility which require 3+ hours per day of interdisciplinary therapy in a comprehensive inpatient rehab setting.  Physiatrist is providing close team supervision and 24 hour management of active medical  problems listed below.  Physiatrist and rehab team continue to assess barriers to discharge/monitor patient progress toward functional and medical goals  Care Tool:  Bathing    Body parts bathed by patient: Right arm, Left arm, Chest, Abdomen, Front perineal area, Buttocks, Right upper leg, Left upper leg, Face   Body parts bathed by helper: Right lower leg, Left lower leg     Bathing assist Assist Level: Contact Guard/Touching assist     Upper Body Dressing/Undressing Upper body dressing   What is the patient wearing?: Pull over shirt    Upper body assist Assist Level: Set up assist    Lower Body Dressing/Undressing Lower body dressing      What is the patient wearing?: Underwear/pull up, Pants     Lower body assist Assist for lower body dressing: Total Assistance - Patient < 25%     Toileting Toileting    Toileting assist Assist for toileting: Minimal Assistance - Patient > 75%     Transfers Chair/bed transfer  Transfers assist     Chair/bed transfer assist level: Minimal Assistance - Patient > 75% Chair/bed transfer assistive device: Museum/gallery exhibitions officer assist      Assist level: Contact Guard/Touching assist Assistive device: Walker-rolling Max distance: 33ft   Walk 10 feet activity   Assist     Assist level: Contact Guard/Touching assist Assistive device: Walker-rolling   Walk 50 feet activity   Assist    Assist level: Contact Guard/Touching assist Assistive device: Walker-rolling  Walk 150 feet activity   Assist    Assist level: Contact Guard/Touching assist Assistive device: Walker-rolling    Walk 10 feet on uneven surface  activity   Assist Walk 10 feet on uneven surfaces activity did not occur: Safety/medical concerns (decreased strength/activity tolerance)         Wheelchair     Assist   Type of Wheelchair: Manual    Wheelchair assist level: Supervision/Verbal cueing Max  wheelchair distance: >150 ft    Wheelchair 50 feet with 2 turns activity    Assist        Assist Level: Supervision/Verbal cueing   Wheelchair 150 feet activity     Assist     Assist Level: Supervision/Verbal cueing     BP (!) 112/48   Pulse 83   Temp 97.8 F (36.6 C) (Oral)   Resp 20   Ht 5\' 6"  (1.676 m)   Wt 73.1 kg   LMP  (LMP Unknown)   SpO2 95%   BMI 26.01 kg/m   Medical Problem List and Plan: 1.    Debility secondary to small bowel ischemia status post exploratory laparotomy lysis of adhesions small bowel resection gastrostomy tube placement 02/08/2020  Continue CIR PT, OT 2.  Antithrombotics: -DVT/anticoagulation: Chronic Coumadin for history of atrial fibrillation             INR subtherapeutic on 8/22             -antiplatelet therapy: N/A 3. Pain Management: Robaxin as needed  Controlled with meds on 8/22  Continue current meds 4. Mood: Prozac 10 mg daily             -antipsychotic agents: N/A 5. Neuropsych: This patient is capable of making decisions on her own behalf. 6. Skin/Wound Care: Routine skin checks 7. Fluids/Electrolytes/Nutrition: Routine in and outs. 8.  Acute blood loss anemia.               Hemoglobin 10.0 on 8/20 9.  Atrial fibrillation.  Continue Cardizem 60 mg every 6 hours.  Chronic Coumadin resumed             Rate controlled on 8/21 in 80's  Monitor with increased exertion. 10.  Hypothyroidism.  Synthroid 11.  Hypoalbuminemia  Supplement initiated  12.  Hyponatremia  Sodium 132 on 8/19, labs ordered for Monday  Continue to monitor  13. Loose stools:  -hold colace and miralax for now  -discussed importance of maintaining regular bm's in light of #1   LOS: 4 days A FACE TO [place] 02/21/2020, 8:27 AM

## 2020-02-21 NOTE — Progress Notes (Signed)
Pt would like to speak to provider in morning about adjusting her bowel meds. Pt has had multiple loose stools. Pt's scheduled colace was held this shift.

## 2020-02-21 NOTE — Progress Notes (Signed)
Physical Therapy Session Note  Patient Details  Name: Stephanie Cortez MRN: [id number] Date of Birth: [birth year]  Today's Date: 02/21/2020 PT Individual Time: 2979-8921 PT Individual Time Calculation (min): 40 min   Short Term Goals: Week 1:  PT Short Term Goal 1 (Week 1): Patient will perform bed mobility with supervision using bed rails to simulate home set up. PT Short Term Goal 2 (Week 1): Patient will perform basic transfers with supervision using LRAD. PT Short Term Goal 3 (Week 1): Patient will ambulate >150 ft using LRAD with supervision.  Skilled Therapeutic Interventions/Progress Updates:  Pt was seen bedside in the am. Pt initially reluctant to participate. Pt stating I just don't think I can do anything today. I saw OT this am for bathing, I just think I need to rest. Pt educated on PT plan of care and interventions. Pt verbalized understanding and eventually agreed to participate with LE exercises. Pt ,oved up in bed with mod A and verbal cues to ease participation with LE exercises.  Pt performed B LEs exercises including heel slides, hip abd/add, SAQs and SLRs, 2 sets x 10 reps each. Following exercises attempted to encourage patient to participate further with therapy including edge of bed or out of bed. Pt continued to state she was tired today and needed a day of rest. Explained to patient she had therapy again at 1 pm. Pt stated maybe if she had a little rest now she would be better able to participate later. Pt repositioned in bed for comfort with bed alarm on and all needs within reach.   Therapy Documentation Precautions:  Precautions Precautions: Fall Restrictions Weight Bearing Restrictions: No General: PT Amount of Missed Time (min): 20 Minutes PT Missed Treatment Reason: Patient fatigue Vital Signs: Oxygen Therapy O2 Device: Room Air Pain: No c/o pain.   Therapy/Group: Individual Therapy  Dub Amis 02/21/2020, 10:46 AM

## 2020-02-21 NOTE — Progress Notes (Signed)
ANTICOAGULATION CONSULT NOTE  Pharmacy Consult for warfarin Indication: atrial fibrillation  Patient Measurements: Height: 5\' 6"  (167.6 cm) Weight: 73.1 kg (161 lb 2.5 oz) IBW/kg (Calculated) : 59.3  Vital Signs: Temp: 97.8 F (36.6 C) (08/22 0253) Temp Source: Oral (08/22 0253) BP: 112/48 (08/22 0500) Pulse Rate: 83 (08/22 0253)  Labs: Recent Labs    02/19/20 0749 02/20/20 0517 02/21/20 0452  HGB 10.0*  --   --   HCT 31.8*  --   --   PLT 456*  --   --   LABPROT 16.6* 17.6* 17.2*  INR 1.4* 1.5* 1.5*    Estimated Creatinine Clearance: 53.6 mL/min (by C-G formula based on SCr of 0.48 mg/dL).  Assessment: 86 yo female with a history of permanent atrial fibrillation presents with abdominal pain and coffee-ground emesis. The patient was found to have small bowel ischemia and underwent exploratory laparotomy, lysis of adhesions, small bowel resection and gastrostomy tube placementon 02/08/20. The patient's INR upon admit was supratherapeutic at 3.3 and the patient received KCentra 1625 units once prior to surgery.  She is on warfarin PTA for afib and pharmacy consulted to restart on 8/15. PO intake increased over the last week, including liquid supplements.  INR remains subtherapeutic today at 1.5 despite slight dose increase yesterday. No s/sx of bleeding noted. Appetite improving today. Warrants another slight dose increase.   PTA: In April, was on warfarin 2.5 mg TTSS and 1.25 mg MWF However, daughter endorses that she has been therapeutic on 1.25 mg daily for the last 6 weeks (dosed by home health)  Goal of Therapy:  INR 2-3 Monitor platelets by anticoagulation protocol: Yes   Plan:  Warfarin 1.75 mg x 1 PO tonight Continue Enoxaparin 40 mg SubQ q24h until INR is therapeutic  Daily PT/INR CBC qMWF  Monitor for s/sx of bleeding, PO intake, Drug Interactions   Thank you for allowing pharmacy to participate in this patient's care.    Rebbeca Paul, PharmD PGY1  Pharmacy Resident 02/21/2020 11:36 AM  Please check [email or web address] for unit-specific pharmacy phone numbers.

## 2020-02-22 ENCOUNTER — Inpatient Hospital Stay (HOSPITAL_COMMUNITY): Payer: Medicare Other

## 2020-02-22 ENCOUNTER — Inpatient Hospital Stay (HOSPITAL_COMMUNITY): Payer: Medicare Other | Admitting: Occupational Therapy

## 2020-02-22 LAB — CBC
HCT: 29.2 % — ABNORMAL LOW (ref 36.0–46.0)
Hemoglobin: 9.2 g/dL — ABNORMAL LOW (ref 12.0–15.0)
MCH: 28.6 pg (ref 26.0–34.0)
MCHC: 31.5 g/dL (ref 30.0–36.0)
MCV: 90.7 fL (ref 80.0–100.0)
Platelets: 437 10*3/uL — ABNORMAL HIGH (ref 150–400)
RBC: 3.22 MIL/uL — ABNORMAL LOW (ref 3.87–5.11)
RDW: 14.6 % (ref 11.5–15.5)
WBC: 6.2 10*3/uL (ref 4.0–10.5)
nRBC: 0 % (ref 0.0–0.2)

## 2020-02-22 LAB — BASIC METABOLIC PANEL
Anion gap: 9 (ref 5–15)
BUN: 5 mg/dL — ABNORMAL LOW (ref 8–23)
CO2: 22 mmol/L (ref 22–32)
Calcium: 9 mg/dL (ref 8.9–10.3)
Chloride: 103 mmol/L (ref 98–111)
Creatinine, Ser: 0.54 mg/dL (ref 0.44–1.00)
GFR calc Af Amer: >60 mL/min (ref >60)
GFR calc non Af Amer: >60 mL/min (ref >60)
Glucose, Bld: 101 mg/dL — ABNORMAL HIGH (ref 70–99)
Potassium: 3.7 mmol/L (ref 3.5–5.1)
Sodium: 134 mmol/L — ABNORMAL LOW (ref 135–145)

## 2020-02-22 LAB — PROTIME-INR
INR: 1.5 — ABNORMAL HIGH (ref 0.8–1.2)
Prothrombin Time: 17.4 seconds — ABNORMAL HIGH (ref 11.4–15.2)

## 2020-02-22 MED ORDER — WARFARIN SODIUM 2.5 MG PO TABS
2.5000 mg | ORAL_TABLET | Freq: Once | ORAL | Status: AC
  Administered 2020-02-22: 2.5 mg via ORAL
  Filled 2020-02-22: qty 1

## 2020-02-22 NOTE — Progress Notes (Signed)
Occupational Therapy Session Note  Patient Details  Name: FUJIE DICKISON MRN: [id number] Date of Birth: 1933/10/16  Today's Date: 02/22/2020 OT Individual Time: 0945-1100 OT Individual Time Calculation (min): 75 min    Short Term Goals: Week 1:  OT Short Term Goal 1 (Week 1): LTG=STG 2/2 ELOS  Skilled Therapeutic Interventions/Progress Updates:    Patient seated in w/c, pleasant and aware of needs.  She states that pain is under control at this time but she struggled last night with increased pain in right LE.  Reviewed and completed posture, deep breathing techniques and light LB and trunk stretching activities with good results.    Completed w/c push ups with focus on body positioning and symmetry.  Completed ambulation X 2 in room approx 20 feet with focus on posture and turning with CS/CGA.  She ambulated from w/c to toilet to bed with RW CS.  Completed toileting with CS pants down, set up for hygiene and min A pants up.  She returned to bed at close of session with CS for sit to supine.  Bed alarm set and call bell in reach.    Therapy Documentation Precautions:  Precautions Precautions: Fall Restrictions Weight Bearing Restrictions: No   Therapy/Group: Individual Therapy  Carlos Levering 02/22/2020, 7:40 AM

## 2020-02-22 NOTE — Progress Notes (Signed)
Physical Therapy Session Note  Patient Details  Name: Stephanie Cortez MRN: [id number] Date of Birth: [birth year]  Today's Date: 02/22/2020 PT Individual Time: 0800-0857 PT Individual Time Calculation (min): 57 min   Short Term Goals: Week 1:  PT Short Term Goal 1 (Week 1): Patient will perform bed mobility with supervision using bed rails to simulate home set up. PT Short Term Goal 2 (Week 1): Patient will perform basic transfers with supervision using LRAD. PT Short Term Goal 3 (Week 1): Patient will ambulate >150 ft using LRAD with supervision.  Skilled Therapeutic Interventions/Progress Updates:   Received pt supine in bed, pt agreeable to therapy, and reported pain 2/10 in R hip (premedicated). Pt declined any additional pain interventions but stated "I don't want to exercise this leg today because it's so sore". Therapist educated pt on healing/recovery process and importance of mobility and exercise for strengthening, however pt still determined that doing nothing on her R LE was most beneficial. Session with emphasis on functional mobility/transfers, dynamic standing balance/coordination, ambulation, and improved activity tolerance. Pt transferred supine<>sitting EOB with bedrails and supervision and transferred bed<>WC stand<>pivot without AD and min A. Pt transferred sit<>stand at sink with CGA x 2 trials to brush teeth, wash face, and comb hair with close supervision. Upon sitting in WC pt SOB and required cues for pursed lip breathing techniques and increased time to recover. Pt performed WC mobility 188ft using bilateral UEs and supervision to therapy gym with emphasis on cardiovascular endurance. Pt reported SOB after activity, O2 sat 91% increasing to 94% in <60 seconds. Pt ambulated 130ft with RW and CGA. Pt demonstrated decreased weight shifting to R due to discomfort on R hip but did not report any increase in pain after ambulating. Pt declined practicing stairs due to R hip discomfort  but agreeable to Nustep. Stand<>pivot WC<>Nustep without AD and min A with cues for hand placement. Pt performed bilateral UE/LE strengthening on Nustep at workload 2 for 1 minute with decreased bilateral knee ROM but switched to bilateral UE strengthening only for additional 6 minutes due to R hip discomfort. Pt required multiple rest breaks throughout session due to poor activity tolerance. Pt transported back to room in [facility] total A and reported urge to use restroom. Stand<>pivot WC<>toilet with bedside commode over top with min A and pt required min A to doff clothes for time management. Concluded session with pt sitting on toilet and NT present in room changing linens and attending to care.   Therapy Documentation Precautions:  Precautions Precautions: Fall Restrictions Weight Bearing Restrictions: No   Therapy/Group: Individual Therapy Alfonse Alpers PT, DPT   02/22/2020, 7:26 AM

## 2020-02-22 NOTE — Progress Notes (Signed)
Foam drsg changed to old IJ site and Drsg changed to peg site. No dng noted to either site. Both sites clean and dry.

## 2020-02-22 NOTE — Progress Notes (Signed)
Physical Therapy Session Note  Patient Details  Name: Stephanie Cortez MRN: [id number] Date of Birth: 06/04/1934   Today's Date: 02/22/2020 PT Individual Time: 7482-7078 PT Individual Time Calculation (min): 58 min   Short Term Goals: Week 1:  PT Short Term Goal 1 (Week 1): Patient will perform bed mobility with supervision using bed rails to simulate home set up. PT Short Term Goal 2 (Week 1): Patient will perform basic transfers with supervision using LRAD. PT Short Term Goal 3 (Week 1): Patient will ambulate >150 ft using LRAD with supervision.  Skilled Therapeutic Interventions/Progress Updates:    Patient received supine in bed, agreeable to PT. She reports increased "tightness" in B hamstrings and L flank pain, 3/10. RN aware of flank pain. Patient able to transfer bed > wc with SBA for a SPT. Patient propelled herself to therapy gym with SPV and verbal cues for directions. SPT with SBA to mat. Once there, patient instructed on seated hamstring stretches 2x30s B and supine hip ER stretch 2x30s. Patient demonstrating limited flexibility in R hip ER > L hip ER. Patient requesting to begin practicing with [facility] asking when she will progress off of walker. PT educated patient on progression of gait from walker given balance, endurance, strength, pain management and gait mechanics. Patient able to trial gait with [facility] using step-to gait pattern and CGA 8x10 ft with seated rest break between bouts. Patient demonstrating Fair+ balance and fairly good follow through of safe gait mechanics. She does require Mod verbal cueing for correct gait sequence with cane. Patient returned to room requesting to go to the bathroom. Per RN, patient safe to transfer to toilet with assist and remain there without SPV until patient pulls call bell. Patient ambulated to toilet c CGA and FWW. Call light in bathroom within reach.   Therapy Documentation Precautions:  Precautions Precautions: Fall Restrictions Weight  Bearing Restrictions: No   Therapy/Group: Individual Therapy  Karoline Caldwell , PT, DPT, CBIS 02/22/2020, 10:33 AM

## 2020-02-22 NOTE — Progress Notes (Signed)
ANTICOAGULATION CONSULT NOTE - Follow-Up  Pharmacy Consult for warfarin Indication: atrial fibrillation  Patient Measurements: Height: 5\' 6"  (167.6 cm) Weight: 73.1 kg (161 lb 2.5 oz) IBW/kg (Calculated) : 59.3  Vital Signs: Temp: 97.6 F (36.4 C) (08/23 0300) Temp Source: Oral (08/23 0300) BP: 111/69 (08/23 0630) Pulse Rate: 73 (08/23 0300)  Labs: Recent Labs    02/20/20 0517 02/21/20 0452 02/22/20 0425  HGB  --   --  9.2*  HCT  --   --  29.2*  PLT  --   --  437*  LABPROT 17.6* 17.2* 17.4*  INR 1.5* 1.5* 1.5*  CREATININE  --   --  0.54    Estimated Creatinine Clearance: 53.6 mL/min (by C-G formula based on SCr of 0.54 mg/dL).  Assessment: 86 yo female with a history of permanent atrial fibrillation presents with abdominal pain and coffee-ground emesis. The patient was found to have small bowel ischemia and underwent exploratory laparotomy, lysis of adhesions, small bowel resection and gastrostomy tube placementon 02/08/20. The patient's INR upon admit was supratherapeutic at 3.3 and the patient received KCentra 1625 units once prior to surgery.  She is on warfarin PTA for afib and pharmacy consulted to restart on 8/15.   INR remains subtherapeutic today at 1.5 despite slight dose increase yesterday. No s/sx of bleeding noted. INR has not increased much over the last week.  Warrants another slight dose increase.   PTA: In April, was on warfarin 2.5 mg TTSS and 1.25 mg MWF However, daughter endorses that she has been therapeutic on 1.25 mg daily for the last 6 weeks (dosed by home health)  Goal of Therapy:  INR 2-3 Monitor platelets by anticoagulation protocol: Yes   Plan:  Warfarin 2.5 mg x 1 PO tonight Continue Enoxaparin 40 mg SubQ q24h until INR is therapeutic  Daily PT/INR CBC qMWF  Monitor for s/sx of bleeding, PO intake, Drug Interactions   Thank you for allowing pharmacy to participate in this patient's care.   [employer], Pharm.D., BCPS Clinical  Pharmacist Clinical phone for 02/22/2020 from 8:30-4:00 is [phone].  **Pharmacist phone directory can be found on [email or web address] listed under [place].  02/22/2020 8:22 AM

## 2020-02-22 NOTE — Plan of Care (Signed)
  Problem: Consults Goal: RH GENERAL PATIENT EDUCATION Description: See Patient Education module for education specifics. Outcome: Progressing   Problem: RH BOWEL ELIMINATION Goal: RH STG MANAGE BOWEL WITH ASSISTANCE Description: STG Manage Bowel with min Assistance. Outcome: Progressing Goal: RH STG MANAGE BOWEL W/MEDICATION W/ASSISTANCE Description: STG Manage Bowel with Medication with mod I  Assistance. Outcome: Progressing   Problem: RH BLADDER ELIMINATION Goal: RH STG MANAGE BLADDER WITH ASSISTANCE Description: STG Manage Bladder With min Assistance Outcome: Progressing   Problem: RH SKIN INTEGRITY Goal: RH STG SKIN FREE OF INFECTION/BREAKDOWN Description: Skin will be free of infection/breakdown with min assist Outcome: Progressing Goal: RH STG MAINTAIN SKIN INTEGRITY WITH ASSISTANCE Description: STG Maintain Skin Integrity With min Assistance. Outcome: Progressing Goal: RH STG ABLE TO PERFORM INCISION/WOUND CARE W/ASSISTANCE Description: STG Able To Perform Incision/Wound Care With min Assistance. Outcome: Progressing   Problem: RH SAFETY Goal: RH STG ADHERE TO SAFETY PRECAUTIONS W/ASSISTANCE/DEVICE Description: STG Adhere to Safety Precautions With min Assistance/Device. Outcome: Progressing   Problem: RH PAIN MANAGEMENT Goal: RH STG PAIN MANAGED AT OR BELOW PT'S PAIN GOAL Description: Pain will be managed at or below 3 out of 10 with min assist Outcome: Progressing   Problem: RH KNOWLEDGE DEFICIT GENERAL Goal: RH STG INCREASE KNOWLEDGE OF SELF CARE AFTER HOSPITALIZATION Description: Pt will be able to verbalize self care while at home with min assist Outcome: Progressing

## 2020-02-22 NOTE — Progress Notes (Signed)
[place] PHYSICAL MEDICINE & REHABILITATION PROGRESS NOTE  Subjective/Complaints:   Pt reports LBM this AM- was more firm after decreasing bowel meds- medium, firm-ISH  No complaints otherwise- ready for therapy.    ROS:  Pt denies SOB, abd pain, CP, N/V/C/D, and vision changes   Objective: Vital Signs: Blood pressure (!) 125/54, pulse 76, temperature (!) 97.4 F (36.3 C), resp. rate 20, height 5\' 6"  (1.676 m), weight 73.1 kg, SpO2 100 %. No results found. Recent Labs    02/22/20 0425  WBC 6.2  HGB 9.2*  HCT 29.2*  PLT 437*   Recent Labs    02/22/20 0425  NA 134*  K 3.7  CL 103  CO2 22  GLUCOSE 101*  BUN 5*  CREATININE 0.54  CALCIUM 9.0    Physical Exam: BP (!) 125/54 (BP Location: Right Arm)   Pulse 76   Temp (!) 97.4 F (36.3 C)   Resp 20   Ht 5\' 6"  (1.676 m)   Wt 73.1 kg   LMP  (LMP Unknown)   SpO2 100%   BMI 26.01 kg/m  Constitutional: No distress . Vital signs reviewed.laying in bed eating breakfast, NAD HEENT: EOMI, oral membranes moist Neck: supple Cardiovascular:RRR  Respiratory/Chest: CTA B/L- no W/R/R- good air movement  GI/Abdomen: Soft, NT, ND, (+)BS  Ext: no clubbing, cyanosis, or edema Psych: pleasant and cooperative- appropriate Skin: Abdominal incision with staples, g-tube in place Left neck with healing incision- dressign in place. Musc: No edema in extremities.  No tenderness in extremities. Neuro: Alert Motor: Bilateral upper extremities, left lower extremity: 5/5 proximal distally Right lower extremity: 4+/5 proximally, 5/5 distally, unchanged  Assessment/Plan: 1. Functional deficits secondary to debility which require 3+ hours per day of interdisciplinary therapy in a comprehensive inpatient rehab setting.  Physiatrist is providing close team supervision and 24 hour management of active medical problems listed below.  Physiatrist and rehab team continue to assess barriers to discharge/monitor patient progress toward  functional and medical goals  Care Tool:  Bathing    Body parts bathed by patient: Right arm, Left arm, Chest, Abdomen, Front perineal area, Buttocks, Right upper leg, Left upper leg, Face   Body parts bathed by helper: Right lower leg, Left lower leg     Bathing assist Assist Level: Contact Guard/Touching assist     Upper Body Dressing/Undressing Upper body dressing   What is the patient wearing?: Pull over shirt    Upper body assist Assist Level: Set up assist    Lower Body Dressing/Undressing Lower body dressing      What is the patient wearing?: Underwear/pull up, Pants     Lower body assist Assist for lower body dressing: Minimal Assistance - Patient > 75%     Toileting Toileting    Toileting assist Assist for toileting: Minimal Assistance - Patient > 75%     Transfers Chair/bed transfer  Transfers assist     Chair/bed transfer assist level: Supervision/Verbal cueing Chair/bed transfer assistive device: Museum/gallery exhibitions officer assist      Assist level: Contact Guard/Touching assist Assistive device: Cane-quad Max distance: 10   Walk 10 feet activity   Assist     Assist level: Contact Guard/Touching assist Assistive device: Cane-quad   Walk 50 feet activity   Assist    Assist level: Contact Guard/Touching assist Assistive device: Walker-rolling    Walk 150 feet activity   Assist    Assist level: Contact Guard/Touching assist Assistive device: Walker-rolling  Walk 10 feet on uneven surface  activity   Assist Walk 10 feet on uneven surfaces activity did not occur: Safety/medical concerns (decreased strength/activity tolerance)         Wheelchair     Assist Will patient use wheelchair at discharge?: Yes Type of Wheelchair: Manual    Wheelchair assist level: Supervision/Verbal cueing Max wheelchair distance: >150 ft    Wheelchair 50 feet with 2 turns activity    Assist         Assist Level: Supervision/Verbal cueing   Wheelchair 150 feet activity     Assist     Assist Level: Supervision/Verbal cueing     BP (!) 125/54 (BP Location: Right Arm)   Pulse 76   Temp (!) 97.4 F (36.3 C)   Resp 20   Ht 5\' 6"  (1.676 m)   Wt 73.1 kg   LMP  (LMP Unknown)   SpO2 100%   BMI 26.01 kg/m   Medical Problem List and Plan: 1.    Debility secondary to small bowel ischemia status post exploratory laparotomy lysis of adhesions small bowel resection gastrostomy tube placement 02/08/2020  Continue CIR PT, OT 2.  Antithrombotics: -DVT/anticoagulation: Chronic Coumadin for history of atrial fibrillation             INR subtherapeutic on 8/22  8/23- INR 1.5 today- con't per pharmacy             -antiplatelet therapy: N/A 3. Pain Management: Robaxin as needed  Controlled with meds on 8/22  Continue current meds 4. Mood: Prozac 10 mg daily             -antipsychotic agents: N/A 5. Neuropsych: This patient is capable of making decisions on her own behalf. 6. Skin/Wound Care: Routine skin checks 7. Fluids/Electrolytes/Nutrition: Routine in and outs. 8.  Acute blood loss anemia.               Hemoglobin 10.0 on 8/20 9.  Atrial fibrillation.  Continue Cardizem 60 mg every 6 hours.  Chronic Coumadin resumed             Rate controlled on 8/21 in 80's  8/23- rate 76- doing well  Monitor with increased exertion. 10.  Hypothyroidism.  Synthroid 11.  Hypoalbuminemia  Supplement initiated  12.  Hyponatremia  Sodium 132 on 8/19, labs ordered for Monday  Continue to monitor  13. Loose stools:  -hold colace and miralax for now  -discussed importance of maintaining regular bm's in light of #1  8/23- still having daily BMs so far- will con't to monitor closely.    LOS: 5 days A FACE TO FACE EVALUATION WAS PERFORMED  Stephanie Cortez 02/22/2020, 7:24 PM

## 2020-02-23 ENCOUNTER — Inpatient Hospital Stay (HOSPITAL_COMMUNITY): Payer: Medicare Other

## 2020-02-23 ENCOUNTER — Inpatient Hospital Stay (HOSPITAL_COMMUNITY): Payer: Medicare Other | Admitting: Occupational Therapy

## 2020-02-23 LAB — PROTIME-INR
INR: 1.6 — ABNORMAL HIGH (ref 0.8–1.2)
Prothrombin Time: 18.5 seconds — ABNORMAL HIGH (ref 11.4–15.2)

## 2020-02-23 MED ORDER — METHOCARBAMOL 500 MG PO TABS
500.0000 mg | ORAL_TABLET | Freq: Three times a day (TID) | ORAL | Status: DC
  Administered 2020-02-23 – 2020-03-02 (×24): 500 mg via ORAL
  Filled 2020-02-23 (×24): qty 1

## 2020-02-23 MED ORDER — WARFARIN SODIUM 2.5 MG PO TABS
2.5000 mg | ORAL_TABLET | Freq: Once | ORAL | Status: AC
  Administered 2020-02-23: 2.5 mg via ORAL
  Filled 2020-02-23: qty 1

## 2020-02-23 NOTE — Progress Notes (Signed)
Physical Therapy Weekly Progress Note  Patient Details  Name: Stephanie Cortez MRN: [id number] Date of Birth: 04/22/1934  Beginning of progress report period: February 18, 2020 End of progress report period: February 23, 2020  Patient has met 1 of 3 short term goals. Pt is making gradual progress towards long terms goals. Pt currently requires supervision for bed mobility using bedrails, min A for stand<>pivot transfers without AD and CGA for stand<>pivot transfers with RW, supervision for WC mobility up to 150ft, and CGA for gait up to 133ft with RW. Pt continues to be limited by poor endurance and activity tolerance and R hip pain and discomfort in abdomen.   Patient continues to demonstrate the following deficits muscle weakness, decreased cardiorespiratoy endurance and decreased standing balance, decreased postural control and decreased balance strategies and therefore will continue to benefit from skilled PT intervention to increase functional independence with mobility.  Patient progressing toward long term goals..  Continue plan of care.  PT Short Term Goals Week 1:  PT Short Term Goal 1 (Week 1): Patient will perform bed mobility with supervision using bed rails to simulate home set up. PT Short Term Goal 1 - Progress (Week 1): Met PT Short Term Goal 2 (Week 1): Patient will perform basic transfers with supervision using LRAD. PT Short Term Goal 2 - Progress (Week 1): Progressing toward goal PT Short Term Goal 3 (Week 1): Patient will ambulate >150 ft using LRAD with supervision. PT Short Term Goal 3 - Progress (Week 1): Progressing toward goal Week 2:  PT Short Term Goal 1 (Week 2): STG=LTG due to LOS  Skilled Therapeutic Interventions/Progress Updates:  Community reintegration;Ambulation/gait training;DME/adaptive equipment instruction;Neuromuscular re-education;Psychosocial support;Stair training;UE/LE Strength taining/ROM;Wheelchair propulsion/positioning;Balance/vestibular  training;Discharge planning;Functional electrical stimulation;Pain management;Skin care/wound management;Therapeutic Activities;UE/LE Coordination activities;Disease management/prevention;Functional mobility training;Patient/family education;Splinting/orthotics;Therapeutic Exercise   Therapy Documentation Precautions:  Precautions Precautions: Fall Restrictions Weight Bearing Restrictions: No   M     PT, DPT  02/23/2020, 7:57 AM   

## 2020-02-23 NOTE — Progress Notes (Signed)
ANTICOAGULATION CONSULT NOTE - Follow-Up  Pharmacy Consult for warfarin Indication: atrial fibrillation  Patient Measurements: Height: 5\' 6"  (167.6 cm) Weight: 73.1 kg (161 lb 2.5 oz) IBW/kg (Calculated) : 59.3  Vital Signs: Temp: 98 F (36.7 C) (08/24 0525) Temp Source: Oral (08/24 0525) BP: 134/60 (08/24 1248) Pulse Rate: 78 (08/24 1248)  Labs: Recent Labs    02/21/20 0452 02/22/20 0425 02/23/20 0719  HGB  --  9.2*  --   HCT  --  29.2*  --   PLT  --  437*  --   LABPROT 17.2* 17.4* 18.5*  INR 1.5* 1.5* 1.6*  CREATININE  --  0.54  --     Estimated Creatinine Clearance: 53.6 mL/min (by C-G formula based on SCr of 0.54 mg/dL).  Assessment: 86 yo female with a history of permanent atrial fibrillation presents with abdominal pain and coffee-ground emesis. The patient was found to have small bowel ischemia and underwent exploratory laparotomy, lysis of adhesions, small bowel resection and gastrostomy tube placementon 02/08/20. The patient's INR upon admit was supratherapeutic at 3.3 and the patient received KCentra 1625 units once prior to surgery.  She is on warfarin PTA for afib and pharmacy consulted to restart on 8/15.   INR remains subtherapeutic today at 1.6; dose increased yesterday. No s/sx of bleeding noted. INR slowly trending up.   PTA: In April, was on warfarin 2.5 mg TTSS and 1.25 mg MWF However, daughter endorses that she has been therapeutic on 1.25 mg daily for the last 6 weeks (dosed by home health).  Goal of Therapy:  INR 2-3 Monitor platelets by anticoagulation protocol: Yes   Plan:  Warfarin 2.5 mg x 1 PO tonight Continue Enoxaparin 40 mg SubQ q24h until INR is therapeutic  Daily PT/INR CBC qMWF  Monitor for s/sx of bleeding, PO intake, Drug Interactions   Thank you for involving pharmacy in this patient's care.  Renold Genta, PharmD, BCPS Clinical Pharmacist Clinical phone for 02/23/2020 until 3p is [phone] 02/23/2020 1:07 PM  **Pharmacist  phone directory can be found on [email or web address] listed under [place]**

## 2020-02-23 NOTE — Progress Notes (Signed)
Occupational Therapy Session Note  Patient Details  Name: Stephanie Cortez MRN: [id number] Date of Birth: 01/30/1934  Today's Date: 02/23/2020 OT Individual Time: 0092-3300 OT Individual Time Calculation (min): 72 min    Short Term Goals: Week 1:  OT Short Term Goal 1 (Week 1): LTG=STG 2/2 ELOS  Skilled Therapeutic Interventions/Progress Updates:    Treatment session with focus on functional transfers, sit > stand, dynamic standing balance, and endurance during ADL retraining tasks of bathing/dressing.  Pt received seated EOB reporting desire to bathe during this session.  Pt reports toileting just prior to this session.  Ambulated to sink with RW with CGA.  Engaged in bathing at sit > stand level at sink with pt requiring increased time and frequent rest breaks due to increased WOB during activity, however all sats WNL.  Therapist provided pt with reacher to assist with LB dressing, pt reports having reachers at home and utilizing them with LB dressing since hip fracture spring 2021.  Pt completed all sit <> stand with close supervision during bathing and dressing tasks.  Pt able to complete LB dressing with question cues for sequencing.  Educated on use of sock aid for donning socks, therapist providing sequencing cues and problem solving techniques to increase pt independence.  Pt also reports having a sock aid at home.  Pt ambulated back to bed with RW with supervision and left seated EOB with all needs in reach and bed alarm on.  Therapy Documentation Precautions:  Precautions Precautions: Fall Restrictions Weight Bearing Restrictions: No General:   Vital Signs: Therapy Vitals Temp: 98 F (36.7 C) Temp Source: Oral Pulse Rate: 68 Resp: 16 BP: 130/60 Patient Position (if appropriate): Lying Oxygen Therapy SpO2: 94 % O2 Device: Room Air Pain: Pain Assessment Pain Scale: 0-10 Pain Score: 5 Pain Type: Acute pain Pain Location: Leg Pain Orientation: Right Pain  Intervention(s): Premedicated   Therapy/Group: Individual Therapy  Simonne Come 02/23/2020, 8:44 AM

## 2020-02-23 NOTE — Progress Notes (Signed)
[facility] PHYSICAL MEDICINE & REHABILITATION PROGRESS NOTE  Subjective/Complaints: Had a BM this morning. Has a little bit of straining.  Vitals look great. Denies SOB. Has some pain in right thigh.    ROS:  Pt denies SOB, abd pain, CP, N/V/C/D, and vision changes   Objective: Vital Signs: Blood pressure 130/60, pulse 68, temperature 98 F (36.7 C), temperature source Oral, resp. rate 16, height 5\' 6"  (1.676 m), weight 73.1 kg, SpO2 94 %. No results found. Recent Labs    02/22/20 0425  WBC 6.2  HGB 9.2*  HCT 29.2*  PLT 437*   Recent Labs    02/22/20 0425  NA 134*  K 3.7  CL 103  CO2 22  GLUCOSE 101*  BUN 5*  CREATININE 0.54  CALCIUM 9.0    Physical Exam: BP 130/60 (BP Location: Right Arm)   Pulse 68   Temp 98 F (36.7 C) (Oral)   Resp 16   Ht 5\' 6"  (1.676 m)   Wt 73.1 kg   LMP  (LMP Unknown)   SpO2 94%   BMI 26.01 kg/m  General: Alert and oriented x 3, No apparent distress HEENT: Head is normocephalic, atraumatic, PERRLA, EOMI, sclera anicteric, oral mucosa pink and moist, dentition intact, ext ear canals clear,  Neck: Supple without JVD or lymphadenopathy Heart: Reg rate and rhythm. No murmurs rubs or gallops Chest: CTA bilaterally without wheezes, rales, or rhonchi; no distress Abdomen: Soft, non-tender, non-distended, bowel sounds positive. Extremities: No clubbing, cyanosis, or edema. Pulses are 2+ Skin: Clean and intact without signs of breakdown Psych: pleasant and cooperative- appropriate Skin: Abdominal incision with staples, g-tube in place Left neck with healing incision- dressign in place. Musc: No edema in extremities.  No tenderness in extremities. Neuro: Alert Motor: Bilateral upper extremities, left lower extremity: 5/5 proximal distally Right lower extremity: 4+/5 proximally, 5/5 distally, unchanged   Assessment/Plan: 1. Functional deficits secondary to debility which require 3+ hours per day of interdisciplinary therapy in a  comprehensive inpatient rehab setting.  Physiatrist is providing close team supervision and 24 hour management of active medical problems listed below.  Physiatrist and rehab team continue to assess barriers to discharge/monitor patient progress toward functional and medical goals  Care Tool:  Bathing    Body parts bathed by patient: Right arm, Left arm, Chest, Abdomen, Front perineal area, Buttocks, Right upper leg, Left upper leg, Face   Body parts bathed by helper: Right lower leg, Left lower leg     Bathing assist Assist Level: Contact Guard/Touching assist     Upper Body Dressing/Undressing Upper body dressing   What is the patient wearing?: Pull over shirt    Upper body assist Assist Level: Set up assist    Lower Body Dressing/Undressing Lower body dressing      What is the patient wearing?: Underwear/pull up, Pants     Lower body assist Assist for lower body dressing: Minimal Assistance - Patient > 75%     Toileting Toileting    Toileting assist Assist for toileting: Minimal Assistance - Patient > 75%     Transfers Chair/bed transfer  Transfers assist     Chair/bed transfer assist level: Supervision/Verbal cueing Chair/bed transfer assistive device: Museum/gallery exhibitions officer assist      Assist level: Contact Guard/Touching assist Assistive device: Cane-quad Max distance: 10   Walk 10 feet activity   Assist     Assist level: Contact Guard/Touching assist Assistive device: Cane-quad   Walk 50  feet activity   Assist    Assist level: Contact Guard/Touching assist Assistive device: Walker-rolling    Walk 150 feet activity   Assist    Assist level: Contact Guard/Touching assist Assistive device: Walker-rolling    Walk 10 feet on uneven surface  activity   Assist Walk 10 feet on uneven surfaces activity did not occur: Safety/medical concerns (decreased strength/activity tolerance)          Wheelchair     Assist Will patient use wheelchair at discharge?: Yes Type of Wheelchair: Manual    Wheelchair assist level: Supervision/Verbal cueing Max wheelchair distance: >150 ft    Wheelchair 50 feet with 2 turns activity    Assist        Assist Level: Supervision/Verbal cueing   Wheelchair 150 feet activity     Assist     Assist Level: Supervision/Verbal cueing     BP 130/60 (BP Location: Right Arm)   Pulse 68   Temp 98 F (36.7 C) (Oral)   Resp 16   Ht 5\' 6"  (1.676 m)   Wt 73.1 kg   LMP  (LMP Unknown)   SpO2 94%   BMI 26.01 kg/m   Medical Problem List and Plan: 1.    Debility secondary to small bowel ischemia status post exploratory laparotomy lysis of adhesions small bowel resection gastrostomy tube placement 02/08/2020  Continue CIR PT, OT 2.  Antithrombotics: -DVT/anticoagulation: Chronic Coumadin for history of atrial fibrillation             INR subtherapeutic on 8/22  8/24 INR is 1.6- continue as per pharmacy             -antiplatelet therapy: N/A 3. Pain Management: Robaxin as needed  Has some increased pain in right thigh- feels sore. Has been taking tylenol and would like something stronger. Discussed increasing Robaxin to three times per day as this is helping and not making her sleepy.   Continue current meds 4. Mood: Prozac 10 mg daily             -antipsychotic agents: N/A 5. Neuropsych: This patient is capable of making decisions on her own behalf. 6. Skin/Wound Care: Routine skin checks 7. Fluids/Electrolytes/Nutrition: Routine in and outs. 8.  Acute blood loss anemia.               Hemoglobin 10.0 on 8/20, 9.2 on 8/23.  9.  Atrial fibrillation.  Continue Cardizem 60 mg every 6 hours.  Chronic Coumadin resumed             Rate controlled on 8/21 in 80's  8/23- rate 76- doing well  Monitor with increased exertion. 10.  Hypothyroidism.  Synthroid 11.  Hypoalbuminemia  Supplement initiated  12.  Hyponatremia  Sodium 132 on  8/19, labs ordered for Monday  Continue to monitor  13. Loose stools:  -hold colace and miralax for now  -discussed importance of maintaining regular bm's in light of #1  8/23- still having daily BMs so far- will con't to monitor closely.    LOS: 6 days A FACE TO FACE EVALUATION WAS PERFORMED  Clide Deutscher Kristene Liberati 02/23/2020, 8:19 AM

## 2020-02-23 NOTE — Progress Notes (Signed)
Physical Therapy Session Note  Patient Details  Name: Stephanie Cortez MRN: [id number] Date of Birth: 1934/03/18  Today's Date: 02/23/2020 PT Individual Time: 1100-1155 and 1415-1528  PT Individual Time Calculation (min): 55 min and 73 min  Short Term Goals: Week 1:  PT Short Term Goal 1 (Week 1): Patient will perform bed mobility with supervision using bed rails to simulate home set up. PT Short Term Goal 2 (Week 1): Patient will perform basic transfers with supervision using LRAD. PT Short Term Goal 3 (Week 1): Patient will ambulate >150 ft using LRAD with supervision.  Skilled Therapeutic Interventions/Progress Updates:   Treatment Session 1: 1100-1155 55 min Received pt supine in bed, pt agreeable to therapy, and reported pain 6/10 in R hip (premedicated). Repositioning, rest breaks, and distraction done to reduce pain levels. Session with emphasis on functional mobility/transfers, generalized strengthening, dynamic standing balance/coordination, ambulation, and improved activity tolerance. Pt transferred supine<>sitting EOB with supervision and use of bedrails and transferred bed<>WC squat<>pivot without AD and CGA. Pt with questions regarding using cane vs RW at home. Therapist informed pt that RW allows for energy conservation to allow pt to ambulate further distances with less assistance but for increased challenge could work on using quad cane with therapy. Pt requested to use restroom and therapist suggested ambulating with quad cane. Pt initially refused but agreeable after convincing. Pt began to ambulate with quad cane but L knee buckled due to weakness and instead pt used RW to ambulate 71ft x 2 trials to/from bathroom with CGA. Pt able to void and manage clothing with CGA. Pt washed hands seated in WC at sink with supervision. Pt transported to ortho gym in [facility] total A and transferred stand<>pivot onto mat with RW CGA. Pt transferred sit<>stand with min A and worked on dynamic standing  balance performed toe taps to 3 cones with R LE x3 trials with mod handheld assist. Pt with fear of falling stating "I can't fall again" therapist reassured pt and encouraged standing rest breaks and deep breathing. Pt refused to attempt activity on L LE due to fear of falling and stated "I'm not doing it and if you want to write that in your report go ahead." However, pt agreed to attempt with RW and performed L LE toe taps to 3 cones with RW and CGA. Worked on dynamic standing balance tossing horseshoes without AD and CGA x 2 trials. Pt initially hesitant to stand without RW but agreeable after extensive convincing. Pt ambulated 56ft on uneven surfaces (ramp) with RW and CGA. Pt worked on increasing step length during gait using agility ladder x 2 trials with RW and CGA with cues for increased step height/length. Pt transported back to room in [facility] total A and transferred stand<>pivot WC<>bed without AD CGA. Sit<>supine with supervision. Concluded session with pt supine in bed, needs within reach, and bed alarm on.   Treatment Session 2: [id number] 56 min Received pt supine in bed, pt agreeable to therapy, and denied any pain during session. Session with emphasis on functional mobility/transfers, generalized strengthening, dynamic standing balance/coordination, ambulation, community navigation, toileting, and improved activity tolerance. Pt performed bed mobility with supervision x 2 trials throughout session with use of bedrails. Pt transferred stand<>pivot x 4 trials throughout session without AD and min A and with RW and CGA. Pt transported outside in [facility] total A and ambulated 65ft with RW CGA/close supervision over uneven concrete surfaces with minimal cues for increased stride length and upward gaze. Pt performed WC mobility >  131ft using bilateral UEs and supervision/min A when descending slopes with emphasis on obstacle navigation, speed control, and cardiovascular endurance. Pt transported to dayroom in [facility]  total A and performed bilateral UE/LE strengthening on Nustep at workload 2 for 10 minutes for a total of 421 steps for improved cardiovascular endurance with 1 rest break. Pt demonstrated improvements with activity tolerance, pain levels, and improved hip/knee ROM compared to last session. Worked on dynamic standing balance playing cornhole without AD and CGA x 3 trials. Pt transported back to room in [facility] total A and ambulated additional 32ft x 2 trials to/from bathroom with RW CGA. Pt able to manage clothes with CGA and void and perform hygeine management independently. Concluded session with pt supine in bed, needs within reach, and bed alarm on.   Therapy Documentation Precautions:  Precautions Precautions: Fall Restrictions Weight Bearing Restrictions: No   Therapy/Group: Individual Therapy Alfonse Alpers PT, DPT   02/23/2020, 7:18 AM

## 2020-02-24 ENCOUNTER — Inpatient Hospital Stay (HOSPITAL_COMMUNITY): Payer: Medicare Other | Admitting: Occupational Therapy

## 2020-02-24 ENCOUNTER — Inpatient Hospital Stay (HOSPITAL_COMMUNITY): Payer: Medicare Other

## 2020-02-24 ENCOUNTER — Inpatient Hospital Stay (HOSPITAL_COMMUNITY): Payer: Medicare Other | Admitting: Physical Therapy

## 2020-02-24 DIAGNOSIS — Z931 Gastrostomy status: Secondary | ICD-10-CM

## 2020-02-24 LAB — CBC
HCT: 29.4 % — ABNORMAL LOW (ref 36.0–46.0)
Hemoglobin: 9.2 g/dL — ABNORMAL LOW (ref 12.0–15.0)
MCH: 28.3 pg (ref 26.0–34.0)
MCHC: 31.3 g/dL (ref 30.0–36.0)
MCV: 90.5 fL (ref 80.0–100.0)
Platelets: 377 10*3/uL (ref 150–400)
RBC: 3.25 MIL/uL — ABNORMAL LOW (ref 3.87–5.11)
RDW: 14.9 % (ref 11.5–15.5)
WBC: 5.8 10*3/uL (ref 4.0–10.5)
nRBC: 0 % (ref 0.0–0.2)

## 2020-02-24 LAB — PROTIME-INR
INR: 2 — ABNORMAL HIGH (ref 0.8–1.2)
Prothrombin Time: 21.8 seconds — ABNORMAL HIGH (ref 11.4–15.2)

## 2020-02-24 MED ORDER — WARFARIN 1.25 MG HALF TABLET
1.2500 mg | ORAL_TABLET | Freq: Once | ORAL | Status: AC
  Administered 2020-02-24: 1.25 mg via ORAL
  Filled 2020-02-24: qty 1

## 2020-02-24 NOTE — Patient Care Conference (Signed)
Inpatient RehabilitationTeam Conference and Plan of Care Update Date: 02/24/2020   Time: 11:47 AM    Patient Name: Stephanie Cortez      Medical Record Number: [id number]  Date of Birth: 1934-01-10 Sex: Female         Room/Bed: 4M01C/4M01C-01 Payor Info: Payor: MEDICARE / Plan: MEDICARE PART A AND B / Product Type: *No Product type* /    Admit Date/Time:  02/17/2020  3:04 PM  Primary Diagnosis:  [facility] Problems: Principal Problem:   Debility Active Problems:   Sepsis ([place])   Subtherapeutic international normalized ratio (INR)   Post-operative pain   S/P percutaneous endoscopic gastrostomy (PEG) tube placement [facility])    Expected Discharge Date: Expected Discharge Date: 03/02/20  Team Members Present: Physician leading conference: Dr. Delice Lesch Care Coodinator Present: Dorien Chihuahua, RN, BSN, CRRN;Other (comment) Jacqlyn Larsen [place], SW) Nurse Present: Serena Croissant, LPN PT Present: Becky Sax, PT OT Present: Simonne Come, OT PPS Coordinator present : Ileana Ladd, Burna Mortimer, SLP     Current Status/Progress Goal Weekly Team Focus  Bowel/Bladder   Pt is continent of bowel/bladder. LBM-08/24.  To remain continent.  Assess tolieting needs   Swallow/Nutrition/ Hydration             ADL's   CGA bathing at sink, Min A LB dressing, CGA transfers with RW  Mod I sit > stand, standing balance, toileting; Supervision bathing, toilet and shower transfers, and LB dressing  ADL retraining, activity tolerance/endurance, dynamic standing balance, transfers   Mobility   bed mobility supervision, transfers without AD min A, gait 143ft with RW CGA, WC mobility 142ft supervision  Mod I; supervision stairs and car  functional mobility/transfers, generalized strengthening, dynamic standing balance/coordination, ambulation, endurance, D/C planning   Communication             Safety/Cognition/ Behavioral Observations            Pain   Pt has complained of mid abdominal pain due  to mid ABD incision discomfort at times, (6/10) Can get prn tylenol q6.  To bring pain level down to 2/10 or no pain at all.  Assess pain q shift or prn.   Skin   Has G-tube in LUQ (Intact), incision to mid abdominal area with adhesive strips.  To promote healing and preventing skin breakdown.  Assess skin q shift or prn.     Discharge Planning:  HOme with daughter assisting-may hire assist for night coverage. Daughter has been in for therapies. Aware will need 24 hr supervision at DC   Team Discussion: On target to meet goals overall.  Patient on target to meet rehab goals: Yes; [place] however requires cues for activities  *See Care Plan and progress notes for long and short-term goals.   Revisions to Treatment Plan:   Teaching Needs: G-tube care, medications, transfers, etc. Family education set for 02/29/20  Current Barriers to Discharge: None  Possible Resolutions to Barriers: N/A     Medical Summary Current Status: Debility secondary to small bowel ischemia status post exploratory laparotomy lysis of adhesions small bowel resection gastrostomy tube placement 02/08/2020     Possible Resolutions to [employer] Focus: Improve mobility, Na+, follow labs, optimize pain meds   Continued Need for Acute Rehabilitation Level of Care: The patient requires daily medical management by a physician with specialized training in physical medicine and rehabilitation for the following reasons: Direction of a multidisciplinary physical rehabilitation program to maximize functional independence : Yes Medical management  of patient stability for increased activity during participation in an intensive rehabilitation regime.: Yes Analysis of laboratory values and/or radiology reports with any subsequent need for medication adjustment and/or medical intervention. : Yes   I attest that I was present, lead the team conference, and concur with the assessment and plan of the  team.   Dorien Chihuahua B 02/24/2020, 3:58 PM

## 2020-02-24 NOTE — Progress Notes (Signed)
Occupational Therapy Weekly Progress Note  Patient Details  Name: Stephanie Cortez MRN: [id number] Date of Birth: [birth year]  Beginning of progress report period: February 18, 2020 End of progress report period: February 24, 2020  Today's Date: 02/24/2020 OT Individual Time: 8280-0349 OT Individual Time Calculation (min): 63 min      Short term goals not set due to estimated length of stay.  Pt is making steady progress towards goals.  Pt is currently completing sit > stand and ambulatory transfers with RW with CGA to close supervision.  Pt is able to complete bathing with supervision to CGA for standing balance and has demonstrated ability to complete LB dressing with use of AE and setup assist to utilize sock aid.  Pt continues to demonstrate decreased endurance, SOB with activity, and persistent pain in Rt hip with increased activity.   Patient continues to demonstrate the following deficits: muscle weakness, decreased cardiorespiratoy endurance and decreased standing balance and decreased balance strategies and therefore will continue to benefit from skilled OT intervention to enhance overall performance with BADL and Reduce care partner burden.  See Patient's Care Plan for progression toward long term goals.  Patient progressing toward long term goals..  Continue plan of care.  Skilled Therapeutic Interventions/Progress Updates:    Treatment session with focus on ADL retraining with bathing/dressing at sit > stand level at sink. Pt received semi-reclined in bed agreeable to bathing and dressing during this session.  Pt completed bed mobility with supervision with use of bed rails.  Completed sit > stand with RW with good carryover of hand placement without any cues.  CGA sit > stand and ambulation with RW to sink.  Engaged in bathing at sit >stand level at sink with supervision.  Pt utilizing reacher when doffing socks and then when donning underwear and pants to allow for increased independence  due to Rt hip discomfort and limited mobility. Pt donned socks this session by crossing LLE into figure 4 position and then utilized sock aid without assistance to don Rt sock.  Pt reports need to toilet.  Ambulated to toilet with close supervision with RW, CGA when transferring to sit on toilet.  Pt completed toileting tasks with close supervision.  Ambulated back to bed and returned to semi-reclined with supervision.  Pt remained in bed with all needs in reach.  Therapy Documentation Precautions:  Precautions Precautions: Fall Restrictions Weight Bearing Restrictions: No General:   Vital Signs: Therapy Vitals Temp: 98.6 F (37 C) Temp Source: Oral Pulse Rate: 69 Resp: 18 BP: (!) 112/57 Patient Position (if appropriate): Lying Oxygen Therapy SpO2: 93 % O2 Device: Room Air Pain:  Pt with no c/o pain   Therapy/Group: Individual Therapy  Simonne Come 02/24/2020, 7:14 AM

## 2020-02-24 NOTE — Progress Notes (Signed)
Nutrition Follow-up  DOCUMENTATION CODES:   Non-severe (moderate) malnutrition in context of chronic illness  INTERVENTION:   - Continue Boost Breeze po TID, each supplement provides 250 kcal and 9 grams of protein  - ProSource Plus po BID, each supplement provides 100 kcal and 15 grams of protein  - Encourage adequate PO intake  NUTRITION DIAGNOSIS:   Moderate Malnutrition related to chronic illness (IBS, COPD, CHF) as evidenced by mild muscle depletion, percent weight loss (16.1% weight loss in less than 5 months).  New diagnosis after completion of NFPE  GOAL:   Patient will meet greater than or equal to 90% of their needs  Progressing  MONITOR:   PO intake, Supplement acceptance, Labs, Weight trends, Skin  REASON FOR ASSESSMENT:   Diagnosis    ASSESSMENT:   86 year old female with PMH of atrial fibrillation, ILD, anxiety, CHF, COPD, HTN, chronic back pain, right hip fracture s/p hemiarthroplasty (received inpatient rehab services 10/30/19 to 11/16/19), HLD, colitis associated with diverticulitis/IBS. Pt presented on 02/08/20 with abdominal pain, coffee-ground emesis. CT angiogram showed what appeared to be small bowel ischemia in the mid lower abdomen likely secondary to torsion of the mesentery. Pt underwent ex-lap, LOA, small bowel resection, and G-tube placement on 02/08/20. Surgical pathology hemorrhagic necrosis consistent with ischemia. Working with clamping of G-tube and diet slowly advanced. Admitted to CIR on 02/17/20.  G-tube remains in place, currently clamped. Pt tolerating a soft diet.  Spoke with pt at bedside. Pt reports appetite is improving somewhat but still not back to normal. She feels like it will be back to normal once she is able to go home and eat the food she likes. Pt reports that she is drinking the [employer] and ProSource Plus supplements. Will continue with current regimen.  Pt with a 14 kg weight loss since 10/12/19. This is a 16.1% weight  loss in less than 5 months which is severe and significant for timeframe. Pt is not aware that she has lost weight and reports that her clothes fit normally. Pt meets criteria for moderate malnutrition at this time.  Meal Completion: 25-100%  Medications reviewed and include: ProSource Plus BID, pepcid, Boost Breeze TID, warfarin  Labs reviewed: sodium 134, hemoglobin 9.2  NUTRITION - FOCUSED PHYSICAL EXAM:    Most Recent Value  Orbital Region No depletion  Upper Arm Region No depletion  Thoracic and Lumbar Region No depletion  Buccal Region Mild depletion  Temple Region Mild depletion  Clavicle Bone Region Mild depletion  Clavicle and Acromion Bone Region Mild depletion  Scapular Bone Region No depletion  Dorsal Hand Mild depletion  Patellar Region No depletion  Anterior Thigh Region Mild depletion  Posterior Calf Region No depletion  Edema (RD Assessment) None  Hair Reviewed  Eyes Reviewed  Mouth Reviewed  Skin Reviewed  Nails Reviewed       Diet Order:   Diet Order            DIET SOFT Room service appropriate? Yes; Fluid consistency: Thin  Diet effective now                 EDUCATION NEEDS:   Education needs have been addressed  Skin:  Skin Assessment: Skin Integrity Issues: Incisions: abdomen  Last BM:  02/23/20  Height:   Ht Readings from Last 1 Encounters:  02/17/20 5\' 6"  (1.676 m)    Weight:   Wt Readings from Last 1 Encounters:  02/17/20 73.1 kg    Ideal Body Weight:  59.1  kg  BMI:  Body mass index is 26.01 kg/m.  Estimated Nutritional Needs:   Kcal:  1700-1900  Protein:  85-100 grams  Fluid:  1.7-1.9 L    Gaynell Face, MS, RD, LDN Inpatient Clinical Dietitian Please see [facility] for contact information.

## 2020-02-24 NOTE — Progress Notes (Signed)
Patient ID: Stephanie Cortez, female   DOB: 11/27/1933, 86 y.o.   MRN: [id number]   Met with pt and spoke with [name]-daughter via telephone to discuss team conference goals supervision-mod/i level goals and target discharge 9/1. Daughter to come in Tuesday at 1:00-3:00 for education. Team aware, pt has equipment from previous admits and will resume home health follow up via [facility]. Continue to follow and work on discharge needs.

## 2020-02-24 NOTE — Progress Notes (Signed)
Occupational Therapy Session Note  Patient Details  Name: Stephanie Cortez MRN: [id number] Date of Birth: 1934/01/15  Today's Date: 02/24/2020 OT Individual Time: 8841-6606 OT Individual Time Calculation (min): 45 min    Short Term Goals: Week 2:  OT Short Term Goal 1 (Week 2): STG = LTGs due to remaining ELOS  Skilled Therapeutic Interventions/Progress Updates:    Treatment session with focus on functional mobility and standing balance during self-care and homemaking tasks.  Pt received semi-reclined in bed voicing concerns about various family health concerns and plan to further coordinate with daughter and son regarding supervision at home after d/c.  Pt voicing concerns about not having any clean clothes.  Discussed typical laundry routine and home and engaged in laundry task during therapy session.  Pt ambulated in to laundry room with RW and placed dirty clothes in washing machine, reached up from laundry detergent and turned on washing machine.  Pt ambulated 50' to therapy mat with RW with close supervision.  Engaged in peg board activity in standing incorporating reaching and problem solving to complete pattern.  Close supervision when reaching outside BOS.  Pt reports increased work of breathing, but sats WNL.  Returned to room via w/c and left upright in w/c with seat belt alarm on and all needs in reach.  Therapy Documentation Precautions:  Precautions Precautions: Fall Restrictions Weight Bearing Restrictions: No General:   Vital Signs: Therapy Vitals Temp: 98.1 F (36.7 C) Temp Source: Oral Pulse Rate: 87 Resp: 15 BP: 111/62 Patient Position (if appropriate): Sitting Oxygen Therapy SpO2: 100 % O2 Device: Room Air Pain:  Pt with no c/o pain  Therapy/Group: Individual Therapy  Simonne Come 02/24/2020, 2:48 PM

## 2020-02-24 NOTE — Progress Notes (Signed)
Physical Therapy Session Note  Patient Details  Name: Stephanie Cortez MRN: [id number] Date of Birth: December 08, 1933  Today's Date: 02/24/2020 PT Individual Time: 8127-5170 PT Individual Time Calculation (min): 43 min   Short Term Goals: Week 1:  PT Short Term Goal 1 (Week 1): Patient will perform bed mobility with supervision using bed rails to simulate home set up. PT Short Term Goal 1 - Progress (Week 1): Met PT Short Term Goal 2 (Week 1): Patient will perform basic transfers with supervision using LRAD. PT Short Term Goal 2 - Progress (Week 1): Progressing toward goal PT Short Term Goal 3 (Week 1): Patient will ambulate >150 ft using LRAD with supervision. PT Short Term Goal 3 - Progress (Week 1): Progressing toward goal Week 2:  PT Short Term Goal 1 (Week 2): STG=LTG due to LOS  Skilled Therapeutic Interventions/Progress Updates:   Received pt supine in bed, pt agreeable to therapy, and denied any pain during session. Session with emphasis on functional mobility/trasfers, toileting, generalized strengthening, dynamic standing balance/coordination, NMR, ambulation, and improved activity tolerance. Pt performed bed mobility with supervision and use of bedrails x 2 trials throughout session. Pt transferred sit<>stand with RW and min A from low surface. Pt ambulated 75f x 2 trials to/from bathroom with RW and CGA. Pt able to void and perform hygiene management independently. Pt able to stand and manage clothing with CGA. Pt stood at sink and washed hands with CGA. Pt ambulated 1025fx 2 trials with RW and CGA to/from therapy gym with cues for upright posture and gaze with 1 standing rest break. Pt ambulated 2075f 2 trials to/from staircase with RW and CGA and navigated 4 steps with 2 rails CGA ascending and descending with step to pattern. Pt required cues for stepping sequence and turning technique. Worked on static standing balance in tandem stance with 1 UE support on RW and CGA/min A for balance  for ~45 seconds on each LE. Pt performed bilateral SLS with 1 UE support on RW with CGA for ~30 seconds each. Pt ambulated 5ft30fth RW CGA to rebounder and worked on dynamic standing balance tossing ball onto reboSCANA Corporationals with CGA for standing balance. Concluded session with pt supine in bed, needs within reach, and bed alarm on.  Therapy Documentation Precautions:  Precautions Precautions: Fall Restrictions Weight Bearing Restrictions: No   Therapy/Group: Individual Therapy AnnaAlfonse Alpers DPT   02/24/2020, 7:21 AM

## 2020-02-24 NOTE — Plan of Care (Signed)
  Problem: Consults Goal: RH GENERAL PATIENT EDUCATION Description: See Patient Education module for education specifics. Outcome: Progressing   Problem: RH BOWEL ELIMINATION Goal: RH STG MANAGE BOWEL WITH ASSISTANCE Description: STG Manage Bowel with min Assistance. Outcome: Progressing Goal: RH STG MANAGE BOWEL W/MEDICATION W/ASSISTANCE Description: STG Manage Bowel with Medication with mod I  Assistance. Outcome: Progressing   Problem: RH BLADDER ELIMINATION Goal: RH STG MANAGE BLADDER WITH ASSISTANCE Description: STG Manage Bladder With min Assistance Outcome: Progressing   Problem: RH SKIN INTEGRITY Goal: RH STG SKIN FREE OF INFECTION/BREAKDOWN Description: Skin will be free of infection/breakdown with min assist Outcome: Progressing Goal: RH STG MAINTAIN SKIN INTEGRITY WITH ASSISTANCE Description: STG Maintain Skin Integrity With min Assistance. Outcome: Progressing Goal: RH STG ABLE TO PERFORM INCISION/WOUND CARE W/ASSISTANCE Description: STG Able To Perform Incision/Wound Care With min Assistance. Outcome: Progressing   Problem: RH SAFETY Goal: RH STG ADHERE TO SAFETY PRECAUTIONS W/ASSISTANCE/DEVICE Description: STG Adhere to Safety Precautions With min Assistance/Device. Outcome: Progressing   Problem: RH PAIN MANAGEMENT Goal: RH STG PAIN MANAGED AT OR BELOW PT'S PAIN GOAL Description: Pain will be managed at or below 3 out of 10 with min assist Outcome: Progressing   Problem: RH KNOWLEDGE DEFICIT GENERAL Goal: RH STG INCREASE KNOWLEDGE OF SELF CARE AFTER HOSPITALIZATION Description: Pt will be able to verbalize self care while at home with min assist Outcome: Progressing

## 2020-02-24 NOTE — Progress Notes (Signed)
Physical Therapy Session Note  Patient Details  Name: Stephanie Cortez MRN: [id number] Date of Birth: [birth year]  Today's Date: 02/24/2020 PT Individual Time: 1435-1515 PT Individual Time Calculation (min): 40 min   Short Term Goals: Week 2:  PT Short Term Goal 1 (Week 2): STG=LTG due to LOS  Skilled Therapeutic Interventions/Progress Updates:   Pt received sitting in WC and agreeable to PT. Pt transported to laundry room to obtain clothes from dryer. Clothes had not finished drying. Gait training in day room with supervision assist 2 x 56f with cues for posture and improved symmetry of step through gait pattern as tolerate. Nusted BLE endurance and RLE ROM training x 832m with cues for increased ROM as tolerated. WC mobility x 15071for UE strengthening with cues for improved turning technique and doorway management. Pt then performed dynanic balance training with ADL of obtaining laundry from dryer and folding clothes with supervision assist and intermittent 1-0 UE support. Pt returned to room and performed stand pivot  transfer to bed with RW and supervision assist. Sit>supine completed with supervision assist as pt managed RLE with UE, and left supine in bed with call bell in reach and all needs met.         Therapy Documentation Precautions:  Precautions Precautions: Fall Restrictions Weight Bearing Restrictions: No    Vital Signs: Therapy Vitals Temp: 98.1 F (36.7 C) Temp Source: Oral Pulse Rate: 87 Resp: 15 BP: 111/62 Patient Position (if appropriate): Sitting Oxygen Therapy SpO2: 100 % O2 Device: Room Air Pain:   denies Therapy/Group: Individual Therapy  AusLorie Phenix25/2021, 3:29 PM

## 2020-02-24 NOTE — Progress Notes (Signed)
ANTICOAGULATION CONSULT NOTE - Follow-Up  Pharmacy Consult for warfarin Indication: atrial fibrillation  Patient Measurements: Height: 5\' 6"  (167.6 cm) Weight: 73.1 kg (161 lb 2.5 oz) IBW/kg (Calculated) : 59.3  Vital Signs: Temp: 98.6 F (37 C) (08/25 0340) Temp Source: Oral (08/25 0340) BP: 112/57 (08/25 0340) Pulse Rate: 69 (08/25 0340)  Labs: Recent Labs    02/22/20 0425 02/23/20 0719 02/24/20 0600  HGB 9.2*  --  9.2*  HCT 29.2*  --  29.4*  PLT 437*  --  377  LABPROT 17.4* 18.5* 21.8*  INR 1.5* 1.6* 2.0*  CREATININE 0.54  --   --     Estimated Creatinine Clearance: 53.6 mL/min (by C-G formula based on SCr of 0.54 mg/dL).  Assessment: 86 yo female with a history of permanent atrial fibrillation presents with abdominal pain and coffee-ground emesis. The patient was found to have small bowel ischemia and underwent exploratory laparotomy, lysis of adhesions, small bowel resection and gastrostomy tube placementon 02/08/20. The patient's INR upon admit was supratherapeutic at 3.3 and the patient received KCentra 1625 units once prior to surgery.  She is on warfarin PTA for afib and pharmacy consulted to restart on 8/15.   INR is therapeutic at 2 today. No s/sx of bleeding noted.  PTA: In April, was on warfarin 2.5 mg TTSS and 1.25 mg MWF However, daughter endorses that she has been therapeutic on 1.25 mg daily for the last 6 weeks (dosed by home health).  Goal of Therapy:  INR 2-3 Monitor platelets by anticoagulation protocol: Yes   Plan:  Warfarin 1.25 mg x 1 PO tonight Consider d/c enoxaparin tomorrow if INR remains therapeutic  Daily PT/INR CBC qMWF  Monitor for s/sx of bleeding, PO intake, Drug Interactions   Thank you for involving pharmacy in this patient's care.  Renold Genta, PharmD, BCPS Clinical Pharmacist Clinical phone for 02/24/2020 until 3p is [phone] 02/24/2020 1:08 PM  **Pharmacist phone directory can be found on [email or web address] listed under [place]**

## 2020-02-24 NOTE — Progress Notes (Addendum)
[name] PHYSICAL MEDICINE & REHABILITATION PROGRESS NOTE  Subjective/Complaints: Patient seen laying in bed this morning.  She states she slept well overnight.  She notes right leg and left upper extremity soreness, but improving.  She believes she is progressing in therapy and is hopeful for discharge soon.   ROS: Denies CP, SOB, N/V/D  Objective: Vital Signs: Blood pressure (!) 112/57, pulse 69, temperature 98.6 F (37 C), temperature source Oral, resp. rate 18, height 5\' 6"  (1.676 m), weight 73.1 kg, SpO2 93 %. No results found. Recent Labs    02/22/20 0425 02/24/20 0600  WBC 6.2 5.8  HGB 9.2* 9.2*  HCT 29.2* 29.4*  PLT 437* 377   Recent Labs    02/22/20 0425  NA 134*  K 3.7  CL 103  CO2 22  GLUCOSE 101*  BUN 5*  CREATININE 0.54  CALCIUM 9.0    Physical Exam: BP (!) 112/57 (BP Location: Right Arm)   Pulse 69   Temp 98.6 F (37 C) (Oral)   Resp 18   Ht 5\' 6"  (1.676 m)   Wt 73.1 kg   LMP  (LMP Unknown)   SpO2 93%   BMI 26.01 kg/m  Constitutional: No distress . Vital signs reviewed. HENT: Normocephalic.  Atraumatic. Eyes: EOMI. No discharge. Cardiovascular: No JVD. Irregularly irregular. Respiratory: Normal effort.  No stridor. Bilaterally clear to auscultation.  GI: Non-distended. +PEG. BS+. Skin: Abdominal incision with steristrips CDI. Warm and dry.   Left neck incision CDI. Psych: Normal mood.  Normal behavior. Musc: No edema in extremities.  No tenderness in extremities. Neuro: Alert Motor: Bilateral upper extremities, left lower extremity: 5/5 proximal distally Right lower extremity: 4+/5 proximally, 5/5 distally  Assessment/Plan: 1. Functional deficits secondary to debility which require 3+ hours per day of interdisciplinary therapy in a comprehensive inpatient rehab setting.  Physiatrist is providing close team supervision and 24 hour management of active medical problems listed below.  Physiatrist and rehab team continue to assess  barriers to discharge/monitor patient progress toward functional and medical goals  Care Tool:  Bathing    Body parts bathed by patient: Right arm, Left arm, Chest, Abdomen, Front perineal area, Buttocks, Right upper leg, Left upper leg, Face   Body parts bathed by helper: Right lower leg, Left lower leg     Bathing assist Assist Level: Contact Guard/Touching assist     Upper Body Dressing/Undressing Upper body dressing   What is the patient wearing?: Pull over shirt, Bra    Upper body assist Assist Level: Set up assist    Lower Body Dressing/Undressing Lower body dressing      What is the patient wearing?: Underwear/pull up, Pants     Lower body assist Assist for lower body dressing: Supervision/Verbal cueing     Toileting Toileting    Toileting assist Assist for toileting: Minimal Assistance - Patient > 75%     Transfers Chair/bed transfer  Transfers assist     Chair/bed transfer assist level: Contact Guard/Touching assist Chair/bed transfer assistive device: Programmer, multimedia   Ambulation assist      Assist level: Contact Guard/Touching assist Assistive device: Cane-quad Max distance: 10   Walk 10 feet activity   Assist     Assist level: Contact Guard/Touching assist Assistive device: Cane-quad   Walk 50 feet activity   Assist    Assist level: Contact Guard/Touching assist Assistive device: Walker-rolling    Walk 150 feet activity   Assist    Assist level: Contact Guard/Touching  assist Assistive device: Walker-rolling    Walk 10 feet on uneven surface  activity   Assist Walk 10 feet on uneven surfaces activity did not occur: Safety/medical concerns (decreased strength/activity tolerance)   Assist level: Contact Guard/Touching assist Assistive device: Aeronautical engineer Will patient use wheelchair at discharge?: Yes Type of Wheelchair: Manual    Wheelchair assist level:  Supervision/Verbal cueing Max wheelchair distance: >150 ft    Wheelchair 50 feet with 2 turns activity    Assist        Assist Level: Supervision/Verbal cueing   Wheelchair 150 feet activity     Assist     Assist Level: Supervision/Verbal cueing     Medical Problem List and Plan: 1.    Debility secondary to small bowel ischemia status post exploratory laparotomy lysis of adhesions small bowel resection gastrostomy tube placement 02/08/2020  Continue CIR  Team conference today to discuss current and goals and coordination of care, home and environmental barriers, and discharge planning with nursing, case manager, and therapies. Please see conference note from today as well.  2.  Antithrombotics: -DVT/anticoagulation: Chronic Coumadin for history of atrial fibrillation             INR therapeutic on 8/25             -antiplatelet therapy: N/A 3. Pain Management: Robaxin as needed  Robaxin to three times per day   Controlled with meds on 8/25  Continue current meds 4. Mood: Prozac 10 mg daily             -antipsychotic agents: N/A 5. Neuropsych: This patient is capable of making decisions on her own behalf. 6. Skin/Wound Care: Routine skin checks 7. Fluids/Electrolytes/Nutrition: Routine in and outs. 8.  Acute blood loss anemia.               Hemoglobin 9.2 on 8/25 9.  Atrial fibrillation.  Continue Cardizem 60 mg every 6 hours.  Chronic Coumadin resumed             Rate controlled on 8/25  Monitor with increased exertion. 10.  Hypothyroidism.  Synthroid 11.  Hypoalbuminemia  Supplement initiated  12.  Hyponatremia  Sodium 134 on 8/23  Continue to monitor  13. Bowel movements:  Improving 14. S/p PEG on 02/08/20  Not using at present.  LOS: 7 days A FACE TO FACE EVALUATION WAS PERFORMED  Stephanie Cortez [name] 02/24/2020, 8:48 AM

## 2020-02-25 ENCOUNTER — Inpatient Hospital Stay (HOSPITAL_COMMUNITY): Payer: Medicare Other | Admitting: Occupational Therapy

## 2020-02-25 ENCOUNTER — Inpatient Hospital Stay (HOSPITAL_COMMUNITY): Payer: Medicare Other | Admitting: Physical Therapy

## 2020-02-25 DIAGNOSIS — E440 Moderate protein-calorie malnutrition: Secondary | ICD-10-CM | POA: Insufficient documentation

## 2020-02-25 LAB — PROTIME-INR
INR: 1.8 — ABNORMAL HIGH (ref 0.8–1.2)
Prothrombin Time: 20.3 seconds — ABNORMAL HIGH (ref 11.4–15.2)

## 2020-02-25 MED ORDER — WARFARIN SODIUM 2.5 MG PO TABS
2.5000 mg | ORAL_TABLET | Freq: Once | ORAL | Status: AC
  Administered 2020-02-25: 2.5 mg via ORAL
  Filled 2020-02-25: qty 1

## 2020-02-25 NOTE — Plan of Care (Signed)
  Problem: Consults Goal: RH GENERAL PATIENT EDUCATION Description: See Patient Education module for education specifics. Outcome: Progressing   Problem: RH BOWEL ELIMINATION Goal: RH STG MANAGE BOWEL WITH ASSISTANCE Description: STG Manage Bowel with min Assistance. Outcome: Progressing Goal: RH STG MANAGE BOWEL W/MEDICATION W/ASSISTANCE Description: STG Manage Bowel with Medication with mod I  Assistance. Outcome: Progressing   Problem: RH BLADDER ELIMINATION Goal: RH STG MANAGE BLADDER WITH ASSISTANCE Description: STG Manage Bladder With min Assistance Outcome: Progressing   Problem: RH SKIN INTEGRITY Goal: RH STG SKIN FREE OF INFECTION/BREAKDOWN Description: Skin will be free of infection/breakdown with min assist Outcome: Progressing Goal: RH STG MAINTAIN SKIN INTEGRITY WITH ASSISTANCE Description: STG Maintain Skin Integrity With min Assistance. Outcome: Progressing Goal: RH STG ABLE TO PERFORM INCISION/WOUND CARE W/ASSISTANCE Description: STG Able To Perform Incision/Wound Care With min Assistance. Outcome: Progressing   Problem: RH SAFETY Goal: RH STG ADHERE TO SAFETY PRECAUTIONS W/ASSISTANCE/DEVICE Description: STG Adhere to Safety Precautions With min Assistance/Device. Outcome: Progressing   Problem: RH PAIN MANAGEMENT Goal: RH STG PAIN MANAGED AT OR BELOW PT'S PAIN GOAL Description: Pain will be managed at or below 3 out of 10 with min assist Outcome: Progressing   Problem: RH KNOWLEDGE DEFICIT GENERAL Goal: RH STG INCREASE KNOWLEDGE OF SELF CARE AFTER HOSPITALIZATION Description: Pt will be able to verbalize self care while at home with min assist Outcome: Progressing

## 2020-02-25 NOTE — Progress Notes (Signed)
Physical Therapy Session Note  Patient Details  Name: Stephanie Cortez MRN: [id number] Date of Birth: 12-24-1933  Today's Date: 02/25/2020 PT Individual Time: 1607-3710 PT Individual Time Calculation (min): 55 min   Short Term Goals: Week 2:  PT Short Term Goal 1 (Week 2): STG=LTG due to LOS  Skilled Therapeutic Interventions/Progress Updates:   Pt received in bathroom with NT present and pt agreeable to therapy session therefore therapist assumed care of pt. Pt required increased time for BM - MD present for morning assessment and made aware of pt's difficulty with BM. Pt completed seated peri-care without assist. Sit>stand BSC over toilet>RW with supervision. Standing with RW performed LB clothing management with close supervision/CGA. Gait ~57ft to sink using RW with close supervision/CGA. Standing hand hygiene at sink with close supervision for safety. Pt demos some SOB while ambulating to sink stating she has been coughing some this AM. Seated EOB donned shoes max assist - pt reporting R hip pain and demos difficulty lifting foot from ground due to the pain. Gait training ~166ft to main therapy gym using RW with CGA - demos very slow gait speed, increased trunk and hip flexion, decreased B LE step lengths, decreased speed of movements, and increased B UE support on RW especially during R stance phase. Pt demos worsening SOB - assessed SpO2 76% increasing to 86% then 97% within 30 seconds (question if it was an accurate reading) because remainder of time pt's Spo2 >90%. Repeated sit<>stands to/from EOM x3 reps prior to pt requesting rest break due to fatigue/SOB - CGA throughout with cuing for decreased reliance on B UE support to promote increased  BLE strengthening. Sitting EOM reclined on burgundy bolster R hip flexor stretch 85min - pt reports strong stretch with this requiring R foot placed on airex to make the stretch more tolerable - pt educated on importance of stretch hip flexors multiple time  daily to ensure full ROM to decrease pain and improve gait mechanics.  Standing balance and upright posture task of placing horseshoes on top of basketball goal from RW - CGA for safety but no LOB. Gait training ~170ft back to room using RW with CGA - demos improving step lengths for more reciprocal pattern and increasing gait speed though continues to rely heavily on B UE support on RW - pt again demos SOB - provided seated rest break upon return to room. Pt reports need to use bathroom. Gait ~28ft to bathroom using RW with CGA - standing using RW  LB clothing management with close supervision for balance safety. Pt left seated on BSC over toilet with NT present to assume care of pt.  Therapy Documentation Precautions:  Precautions Precautions: Fall Restrictions Weight Bearing Restrictions: No  Pain: Reports R hip pain rated as 6-7/10 - RN notified for medication administration - therapist provided rest breaks and repositioning for pain management.   Therapy/Group: Individual Therapy  Tawana Scale , PT, DPT, CSRS  02/25/2020, 7:59 AM

## 2020-02-25 NOTE — Progress Notes (Signed)
ANTICOAGULATION CONSULT NOTE - Follow-Up  Pharmacy Consult for warfarin Indication: atrial fibrillation  Patient Measurements: Height: 5\' 6"  (167.6 cm) Weight: 73.1 kg (161 lb 2.5 oz) IBW/kg (Calculated) : 59.3  Vital Signs: Temp: 97.6 F (36.4 C) (08/26 0527) BP: 143/53 (08/26 0527) Pulse Rate: 79 (08/26 0527)  Labs: Recent Labs    02/23/20 0719 02/24/20 0600 02/25/20 0743  HGB  --  9.2*  --   HCT  --  29.4*  --   PLT  --  377  --   LABPROT 18.5* 21.8* 20.3*  INR 1.6* 2.0* 1.8*    Estimated Creatinine Clearance: 53.6 mL/min (by C-G formula based on SCr of 0.54 mg/dL).  Assessment: 86 yo female with a history of permanent atrial fibrillation presents with abdominal pain and coffee-ground emesis. The patient was found to have small bowel ischemia and underwent exploratory laparotomy, lysis of adhesions, small bowel resection and gastrostomy tube placementon 02/08/20. The patient's INR upon admit was supratherapeutic at 3.3 and the patient received KCentra 1625 units once prior to surgery.  She is on warfarin PTA for afib and pharmacy consulted to restart on 8/15.   INR trended down to 1.8 today.  No s/sx of bleeding noted.  PTA: In April, was on warfarin 2.5 mg TTSS and 1.25 mg MWF However, daughter endorses that she has been therapeutic on 1.25 mg daily for the last 6 weeks (dosed by home health).  Goal of Therapy:  INR 2-3 Monitor platelets by anticoagulation protocol: Yes   Plan:  Increase Warfarin 2.5 mg x 1 PO tonight Daily PT/INR CBC qMWF  Monitor for s/sx of bleeding, PO intake, Drug Interactions   Thank you for involving pharmacy in this patient's care.  Horton Chin, PharmD, BCPS Clinical Pharmacist Clinical phone for 02/25/2020 until 3p is [phone] 02/25/2020 1:30 PM  **Pharmacist phone directory can be found on [email or web address] listed under [place]**

## 2020-02-25 NOTE — Progress Notes (Signed)
Occupational Therapy Session Note  Patient Details  Name: Stephanie Cortez MRN: [id number] Date of Birth: Nov 24, 1933  Today's Date: 02/25/2020 OT Individual Time: 1000-1107 OT Individual Time Calculation (min): 67 min    Short Term Goals: Week 1:  OT Short Term Goal 1 (Week 1): LTG=STG 2/2 ELOS OT Short Term Goal 1 - Progress (Week 1): Progressing toward goal Week 2:  OT Short Term Goal 1 (Week 2): STG = LTGs due to remaining ELOS  Skilled Therapeutic Interventions/Progress Updates:    Pt seen this session for BADL training with a focus on mobility, balance, and use of AE.  Pt received sitting in wc ready for therapy.  Pt did want to bathe and change clothing.  Pt used RW to stand and ambulate to arm chair at the sink with CGA.  At the sink, she stood with S while completing oral care independently.    From the arm chair, completed all UB self care without A.  Using reacher to doff, don clothes over feet - pt dressed LB with close S and min A to reach her feet for bathing.  Pt talking quite a bit while she was working and frequently would get out of breath. Cues to stop and breathe slowly to relax and not try to move too fast or continuously.   Requested to lay in bed to rest, used RW with close S to ambulate to bed and got into bed without A.     Pt resting in bed with all needs met.    Excellent participation.     Therapy Documentation Precautions:  Precautions Precautions: Fall Restrictions Weight Bearing Restrictions: No    Vital Signs: Oxygen Therapy SpO2: 96 % O2 Device: Room Air Pain: Pain Assessment Pain Score: 2  Pain Location: Hip Pain Orientation: Left Pain Descriptors / Indicators: Aching Pain Onset: Gradual    Therapy/Group: Individual Therapy  [place] 02/25/2020, 10:31 AM

## 2020-02-25 NOTE — Progress Notes (Signed)
[facility] PHYSICAL MEDICINE & REHABILITATION PROGRESS NOTE  Subjective/Complaints: Constipated this morning. Does not want laxatives as feels she will have diarrhea. Agreeable to prune juice with meals- diet order edited.  Systolic BP high, other vitals stable  ROS: Denies CP, SOB, N/V/D  Objective: Vital Signs: Blood pressure (!) 143/53, pulse 79, temperature 97.6 F (36.4 C), resp. rate 17, height 5\' 6"  (1.676 m), weight 73.1 kg, SpO2 96 %. No results found. Recent Labs    02/24/20 0600  WBC 5.8  HGB 9.2*  HCT 29.4*  PLT 377   No results for input(s): NA, K, CL, CO2, GLUCOSE, BUN, CREATININE, CALCIUM in the last 72 hours.  Physical Exam: BP (!) 143/53 (BP Location: Left Arm)   Pulse 79   Temp 97.6 F (36.4 C)   Resp 17   Ht 5\' 6"  (1.676 m)   Wt 73.1 kg   LMP  (LMP Unknown)   SpO2 96%   BMI 26.01 kg/m  General: Alert and oriented x 3, No apparent distress HEENT: Head is normocephalic, atraumatic, PERRLA, EOMI, sclera anicteric, oral mucosa pink and moist, dentition intact, ext ear canals clear,  Neck: Supple without JVD or lymphadenopathy Heart: Reg rate and rhythm. No murmurs rubs or gallops Chest: CTA bilaterally without wheezes, rales, or rhonchi; no distress GI: Non-distended. +PEG. BS+. Skin: Abdominal incision with steristrips CDI. Warm and dry.   Left neck incision CDI. Psych: Normal mood.  Normal behavior. Musc: No edema in extremities.  No tenderness in extremities. Neuro: Alert Motor: Bilateral upper extremities, left lower extremity: 5/5 proximal distally Right lower extremity: 4+/5 proximally, 5/5 distally  Assessment/Plan: 1. Functional deficits secondary to debility which require 3+ hours per day of interdisciplinary therapy in a comprehensive inpatient rehab setting.  Physiatrist is providing close team supervision and 24 hour management of active medical problems listed below.  Physiatrist and rehab team continue to assess barriers to  discharge/monitor patient progress toward functional and medical goals  Care Tool:  Bathing    Body parts bathed by patient: Right arm, Left arm, Chest, Abdomen, Front perineal area, Buttocks, Right upper leg, Left upper leg, Face   Body parts bathed by helper: Right lower leg, Left lower leg     Bathing assist Assist Level: Supervision/Verbal cueing     Upper Body Dressing/Undressing Upper body dressing   What is the patient wearing?: Pull over shirt, Bra    Upper body assist Assist Level: Set up assist    Lower Body Dressing/Undressing Lower body dressing      What is the patient wearing?: Underwear/pull up, Pants     Lower body assist Assist for lower body dressing: Supervision/Verbal cueing     Toileting Toileting    Toileting assist Assist for toileting: Contact Guard/Touching assist     Transfers Chair/bed transfer  Transfers assist     Chair/bed transfer assist level: Contact Guard/Touching assist Chair/bed transfer assistive device: Programmer, multimedia   Ambulation assist      Assist level: Contact Guard/Touching assist Assistive device: Walker-rolling Max distance: 174ft   Walk 10 feet activity   Assist     Assist level: Contact Guard/Touching assist Assistive device: Walker-rolling   Walk 50 feet activity   Assist    Assist level: Contact Guard/Touching assist Assistive device: Walker-rolling    Walk 150 feet activity   Assist    Assist level: Contact Guard/Touching assist Assistive device: Walker-rolling    Walk 10 feet on uneven surface  activity  Assist Walk 10 feet on uneven surfaces activity did not occur: Safety/medical concerns (decreased strength/activity tolerance)   Assist level: Contact Guard/Touching assist Assistive device: Aeronautical engineer Will patient use wheelchair at discharge?: Yes Type of Wheelchair: Manual    Wheelchair assist level: Supervision/Verbal  cueing Max wheelchair distance: >150 ft    Wheelchair 50 feet with 2 turns activity    Assist        Assist Level: Supervision/Verbal cueing   Wheelchair 150 feet activity     Assist     Assist Level: Supervision/Verbal cueing     Medical Problem List and Plan: 1.    Debility secondary to small bowel ischemia status post exploratory laparotomy lysis of adhesions small bowel resection gastrostomy tube placement 02/08/2020  Continue CIR  2.  Antithrombotics: -DVT/anticoagulation: Chronic Coumadin for history of atrial fibrillation  INR 1.8 on 8/26             -antiplatelet therapy: N/A 3. Pain Management: Robaxin as needed  Robaxin to three times per day   Controlled with meds on 8/26  Continue current meds 4. Mood: Prozac 10 mg daily             -antipsychotic agents: N/A 5. Neuropsych: This patient is capable of making decisions on her own behalf. 6. Skin/Wound Care: Routine skin checks 7. Fluids/Electrolytes/Nutrition: Routine in and outs. 8.  Acute blood loss anemia.               Hemoglobin 9.2 on 8/25 9.  Atrial fibrillation.  Continue Cardizem 60 mg every 6 hours.  Chronic Coumadin resumed             Rate controlled 8/26.   Monitor with increased exertion. 10.  Hypothyroidism.  Synthroid 11.  Hypoalbuminemia  Supplement initiated  12.  Hyponatremia  Sodium 134 on 8/23  Continue to monitor  13. Bowel movements:  Improving 14. S/p PEG on 02/08/20  Not using at present.  LOS: 8 days A FACE TO FACE EVALUATION WAS PERFORMED  Clide Deutscher Tarell Schollmeyer 02/25/2020, 9:42 AM

## 2020-02-25 NOTE — Progress Notes (Addendum)
Physical Therapy Session Note  Patient Details  Name: Stephanie Cortez MRN: [id number] Date of Birth: 04/06/1934  Today's Date: 02/25/2020 PT Individual Time: 1301-1355 PT Individual Time Calculation (min): 54 min   Short Term Goals: Week 1:  PT Short Term Goal 1 (Week 1): Patient will perform bed mobility with supervision using bed rails to simulate home set up. PT Short Term Goal 1 - Progress (Week 1): Met PT Short Term Goal 2 (Week 1): Patient will perform basic transfers with supervision using LRAD. PT Short Term Goal 2 - Progress (Week 1): Progressing toward goal PT Short Term Goal 3 (Week 1): Patient will ambulate >150 ft using LRAD with supervision. PT Short Term Goal 3 - Progress (Week 1): Progressing toward goal  Skilled Therapeutic Interventions/Progress Updates: Pt presents semi-reclined in bed and agreeable to participate in therapy.  Pt transfers sup to sit w/ supervision and use of side rals.  Pt sat EOB and able to don left shoe w/ Figure-4 pattern and then assist w/ heel of right.  Pt transfers sit to stand w/ CGA and verbal cues for forward lean.  Pt performed standing there ex, including marching, knee flexion and partial squais 3 x 10-15 w/ seated rest breaks between 2/2 fatigue.  Pt amb multiple trials w/ RW of 80', 60' x 2 w/ CGA and cues for visual scanning.  Pt required seated rest breaks.  Pt requested to return to room 2/2 fatigue and then amb to BR w/ RW and CGA x 15'.  Pt wishes to remain in BR, will pull assist rope when done.  Spoke w/ NT and will assist BTB.       Therapy Documentation Precautions:  Precautions Precautions: Fall Restrictions Weight Bearing Restrictions: No General:   Vital Signs:  Pain:6/10 anterior right thigh, increased during rx.  Nursing will give pain meds when appropriate. Pain Assessment Pain Score: 2  Pain Location: Hip Pain Orientation: Left Pain Descriptors / Indicators: Aching Pain Onset: Gradual Mobility:   Locomotion :     Trunk/Postural Assessment :    Balance:   Exercises:   Other Treatments:      Therapy/Group: Individual Therapy   P  02/25/2020, 2:02 PM  

## 2020-02-26 ENCOUNTER — Inpatient Hospital Stay (HOSPITAL_COMMUNITY): Payer: Medicare Other | Admitting: Occupational Therapy

## 2020-02-26 ENCOUNTER — Inpatient Hospital Stay (HOSPITAL_COMMUNITY): Payer: Medicare Other

## 2020-02-26 ENCOUNTER — Inpatient Hospital Stay (HOSPITAL_COMMUNITY): Payer: Medicare Other | Admitting: Physical Therapy

## 2020-02-26 LAB — CBC
HCT: 30.2 % — ABNORMAL LOW (ref 36.0–46.0)
Hemoglobin: 9.3 g/dL — ABNORMAL LOW (ref 12.0–15.0)
MCH: 28.4 pg (ref 26.0–34.0)
MCHC: 30.8 g/dL (ref 30.0–36.0)
MCV: 92.4 fL (ref 80.0–100.0)
Platelets: 378 10*3/uL (ref 150–400)
RBC: 3.27 MIL/uL — ABNORMAL LOW (ref 3.87–5.11)
RDW: 15.1 % (ref 11.5–15.5)
WBC: 5.5 10*3/uL (ref 4.0–10.5)
nRBC: 0 % (ref 0.0–0.2)

## 2020-02-26 LAB — PROTIME-INR
INR: 2.2 — ABNORMAL HIGH (ref 0.8–1.2)
Prothrombin Time: 23.9 seconds — ABNORMAL HIGH (ref 11.4–15.2)

## 2020-02-26 MED ORDER — WARFARIN SODIUM 2.5 MG PO TABS
2.5000 mg | ORAL_TABLET | Freq: Once | ORAL | Status: AC
  Administered 2020-02-26: 2.5 mg via ORAL
  Filled 2020-02-26: qty 1

## 2020-02-26 NOTE — Progress Notes (Signed)
Occupational Therapy Session Note  Patient Details  Name: Stephanie Cortez MRN: [id number] Date of Birth: 02-16-1934  Today's Date: 02/26/2020 OT Individual Time: 8502-7741 OT Individual Time Calculation (min): 57 min    Short Term Goals: Week 2:  OT Short Term Goal 1 (Week 2): STG = LTGs due to remaining ELOS  Skilled Therapeutic Interventions/Progress Updates:    Treatment session with focus on self-care retraining, endurance, dynamic standing balance, and recall of technique to utilize AE to increase independence with bathing and dressing tasks.  Pt received semi-reclined in bed reporting fatigue but expressing desire to bathe and dress this morning.  Pt completed bed mobility Supervision and completed sit > stand with RW with increased time for problem solving hand placement but able to complete without additional cues.  Ambulated to sink with RW with close supervision.  Pt completed oral care in standing at sink with supervision.  Engaged in bathing at sit > stand level at sink with focus on increased independence.  Discussed use of long handled sponge to wash RLE.  Pt able to utilize reacher without cues to doff and don underwear and pants.  Required cues to utilize sock aid when donning Rt sock.  Pt overall distant supervision with sit <> stand during bathing and dressing tasks at sink.  Pt with questions about when she can return to bathing at shower level.  Pt with SOB intermittently throughout session, educated on incorporating rest breaks throughout tasks to increase activity tolerance.  Pt ambulated back to bed with RW with close supervision and returned to supine without assistance.  Pt left semi-reclined in bed with all needs in reach.  Therapy Documentation Precautions:  Precautions Precautions: Fall Restrictions Weight Bearing Restrictions: No General:   Vital Signs: Therapy Vitals Temp: 98.2 F (36.8 C) Temp Source: Oral Pulse Rate: 82 Resp: 16 BP: 133/61 Patient  Position (if appropriate): Lying Oxygen Therapy SpO2: 98 % O2 Device: Room Air Patient Activity (if Appropriate): In bed Pulse Oximetry Type: Intermittent Pain: Pt with c/o pain 5/10 in Rt hip.  Repositioned.  Therapy/Group: Individual Therapy  Simonne Come 02/26/2020, 9:15 AM

## 2020-02-26 NOTE — Progress Notes (Signed)
ANTICOAGULATION CONSULT NOTE - Follow-Up  Pharmacy Consult for warfarin Indication: atrial fibrillation  Patient Measurements: Height: 5\' 6"  (167.6 cm) Weight: 73.1 kg (161 lb 2.5 oz) IBW/kg (Calculated) : 59.3  Vital Signs: Temp: 98.2 F (36.8 C) (08/27 0600) Temp Source: Oral (08/27 0600) BP: 133/61 (08/27 0600) Pulse Rate: 82 (08/27 0600)  Labs: Recent Labs    02/24/20 0600 02/25/20 0743 02/26/20 0535  HGB 9.2*  --  9.3*  HCT 29.4*  --  30.2*  PLT 377  --  378  LABPROT 21.8* 20.3* 23.9*  INR 2.0* 1.8* 2.2*    Estimated Creatinine Clearance: 53.6 mL/min (by C-G formula based on SCr of 0.54 mg/dL).  Assessment: 86 yo female with a history of permanent atrial fibrillation presents with abdominal pain and coffee-ground emesis. The patient was found to have small bowel ischemia and underwent exploratory laparotomy, lysis of adhesions, small bowel resection and gastrostomy tube placementon 02/08/20. The patient's INR upon admit was supratherapeutic at 3.3 and the patient received KCentra 1625 units once prior to surgery.  She is on warfarin PTA for afib and pharmacy consulted to restart on 8/15.   INR trended up to 2.2 today.  No s/sx of bleeding noted.  PTA: In April, was on warfarin 2.5 mg TTSS and 1.25 mg MWF However, daughter endorses that she has been therapeutic on 1.25 mg daily for the last 6 weeks (dosed by home health).  Goal of Therapy:  INR 2-3 Monitor platelets by anticoagulation protocol: Yes   Plan:  Warfarin 2.5 mg x 1 PO tonight Daily PT/INR CBC qMWF  Monitor for s/sx of bleeding, PO intake, Drug Interactions   Thank you for involving pharmacy in this patient's care.  Renold Genta, PharmD, BCPS Clinical Pharmacist Clinical phone for 02/26/2020 until 3p is [phone] 02/26/2020 9:06 AM  **Pharmacist phone directory can be found on [email or web address] listed under [place]**

## 2020-02-26 NOTE — Progress Notes (Signed)
Physical Therapy Session Note  Patient Details  Name: Stephanie Cortez MRN: [id number] Date of Birth: 08/12/1933  Today's Date: 02/26/2020 PT Individual Time: 1594-5859 PT Individual Time Calculation (min): 42 min   Short Term Goals: Week 2:  PT Short Term Goal 1 (Week 2): STG=LTG due to LOS  Skilled Therapeutic Interventions/Progress Updates:  Pt received sitting on EOB & agreeable to tx. Sit<>stand with supervision & extra time to push up with UE. Gait in room/bathroom with RW & supervision with pt demonstrating decreased gait speed, forward flexed trunk, & decreased BLE hip & knee flexion during swing phase, dorsiflexion & heel strike. Pt performs clothing management without assistance, toilet transfer with supervision. Pt with continent void on toilet & performs hand hygiene standing with supervision. Gait room<>gym with RW & supervision, in gym HR = 106 but decreases to 98 bpm, SpO2 = 97% on room air. Pt completes TUG with RW & supervision, see score below. PT educates pt on high fall risk as noted by TUG & need to use RW at all times for mobility. Pt completes sit>supine with supervision. Educated pt on need to use incentive spirometer as pt noted to have labored breathing during session. Pt left in bed with call bell in reach, bed alarm set, all needs at hand.  Therapy Documentation Precautions:  Precautions Precautions: Fall Restrictions Weight Bearing Restrictions: No  Pain: "stays pretty much" a 4-7/10 in RLE with rest breaks provided PRN  Balance: Balance Balance Assessed: Yes Standardized Balance Assessment Standardized Balance Assessment: Timed Up and Go Test Timed Up and Go Test TUG: Normal TUG Normal TUG (seconds): 60 (average of 3 trials (54 seconds, 59 seconds, 67 seconds) with RW & supervision)    Therapy/Group: Individual Therapy  Waunita Schooner 02/26/2020, 1:47 PM

## 2020-02-26 NOTE — Progress Notes (Signed)
Physical Therapy Session Note  Patient Details  Name: Stephanie Cortez MRN: [id number] Date of Birth: Apr 25, 1934  Today's Date: 02/26/2020 PT Individual Time: 1001-1058 PT Individual Time Calculation (min): 57 min   Short Term Goals: Week 2:  PT Short Term Goal 1 (Week 2): STG=LTG due to LOS  Skilled Therapeutic Interventions/Progress Updates:    Patient received supine in bed agreeable to PT. She reports 3/10 pain in R hip and describes it as an ache. She declines further pain intervention at this time. Patient able to transfer to wc with FWW + SBA. TotalA to don shoes in sitting. Patient ambulating 119ft x2 with FWW + SBA and verbal cues for upright posture. Patient does requires extended rest breaks between therapeutic tasks d/t poor endurance/activity tolerance. In // bars, patient able to complete lateral stepping x4 laps for improved hip abd strengthening with SPV and verbal cues for form. She was able to ambulate backwards x 4 laps while maintaining upright posture and B UE support on // bars. Shortened step length noted B. Patient then able to complete 4x8 minisquats in // bars with SBA. Patient will benefit from further proximal mm strengthening for improved stability in standing and with ambulation. Patient requesting to return to bed, bed alarm on, call light within reach.   Therapy Documentation Precautions:  Precautions Precautions: Fall Restrictions Weight Bearing Restrictions: No    Therapy/Group: Individual Therapy  Karoline Caldwell, PT, DPT, CBIS 02/26/2020, 7:44 AM

## 2020-02-26 NOTE — Plan of Care (Signed)
°  Problem: Consults Goal: RH GENERAL PATIENT EDUCATION Description: See Patient Education module for education specifics. Outcome: Progressing   Problem: RH BOWEL ELIMINATION Goal: RH STG MANAGE BOWEL WITH ASSISTANCE Description: STG Manage Bowel with min Assistance. Outcome: Progressing Goal: RH STG MANAGE BOWEL W/MEDICATION W/ASSISTANCE Description: STG Manage Bowel with Medication with mod I  Assistance. Outcome: Progressing   Problem: RH BLADDER ELIMINATION Goal: RH STG MANAGE BLADDER WITH ASSISTANCE Description: STG Manage Bladder With min Assistance Outcome: Progressing   Problem: RH SKIN INTEGRITY Goal: RH STG SKIN FREE OF INFECTION/BREAKDOWN Description: Skin will be free of infection/breakdown with min assist Outcome: Progressing Goal: RH STG MAINTAIN SKIN INTEGRITY WITH ASSISTANCE Description: STG Maintain Skin Integrity With min Assistance. Outcome: Progressing Goal: RH STG ABLE TO PERFORM INCISION/WOUND CARE W/ASSISTANCE Description: STG Able To Perform Incision/Wound Care With min Assistance. Outcome: Progressing   Problem: RH SAFETY Goal: RH STG ADHERE TO SAFETY PRECAUTIONS W/ASSISTANCE/DEVICE Description: STG Adhere to Safety Precautions With min Assistance/Device. Outcome: Progressing   Problem: RH PAIN MANAGEMENT Goal: RH STG PAIN MANAGED AT OR BELOW PT'S PAIN GOAL Description: Pain will be managed at or below 3 out of 10 with min assist Outcome: Progressing   Problem: RH KNOWLEDGE DEFICIT GENERAL Goal: RH STG INCREASE KNOWLEDGE OF SELF CARE AFTER HOSPITALIZATION Description: Pt will be able to verbalize self care while at home with min assist Outcome: Progressing

## 2020-02-26 NOTE — Progress Notes (Addendum)
[facility] PHYSICAL MEDICINE & REHABILITATION PROGRESS NOTE  Subjective/Complaints: Patient seen standing up, working up with therapies this AM.  She states she slept well overnight.  She notes limitations in ambulation due to decreased endurance and right leg pain, but this has been since her previous surgery and slowly improving.  ROS: Denies CP, SOB, N/V/D  Objective: Vital Signs: Blood pressure 133/61, pulse 82, temperature 98.2 F (36.8 C), temperature source Oral, resp. rate 16, height 5\' 6"  (1.676 m), weight 73.1 kg, SpO2 98 %. No results found. Recent Labs    02/24/20 0600 02/26/20 0535  WBC 5.8 5.5  HGB 9.2* 9.3*  HCT 29.4* 30.2*  PLT 377 378   No results for input(s): NA, K, CL, CO2, GLUCOSE, BUN, CREATININE, CALCIUM in the last 72 hours.  Physical Exam: BP 133/61 (BP Location: Left Arm)   Pulse 82   Temp 98.2 F (36.8 C) (Oral)   Resp 16   Ht 5\' 6"  (1.676 m)   Wt 73.1 kg   LMP  (LMP Unknown)   SpO2 98%   BMI 26.01 kg/m  Constitutional: No distress . Vital signs reviewed. HENT: Normocephalic.  Atraumatic. Eyes: EOMI. No discharge. Cardiovascular: No JVD.  Irregularly irregular. Respiratory: Normal effort.  No stridor.  Bilateral clear to auscultation. GI: Non-distended. +PEG. BS+. Skin: Abdomen with Steri-Strips CDI. Left neck incision CDI Psych: Normal mood.  Normal behavior. Musc: No edema in extremities.  No tenderness in extremities. Neuro: Alert Motor: Bilateral upper extremities, left lower extremity: 5/5 proximal distally Right lower extremity: 4+/5 proximally, 5/5 distally, stable  Assessment/Plan: 1. Functional deficits secondary to debility which require 3+ hours per day of interdisciplinary therapy in a comprehensive inpatient rehab setting.  Physiatrist is providing close team supervision and 24 hour management of active medical problems listed below.  Physiatrist and rehab team continue to assess barriers to discharge/monitor patient  progress toward functional and medical goals  Care Tool:  Bathing    Body parts bathed by patient: Right arm, Left arm, Chest, Abdomen, Front perineal area, Buttocks, Right upper leg, Left upper leg, Face   Body parts bathed by helper: Right lower leg, Left lower leg     Bathing assist Assist Level: Minimal Assistance - Patient > 75%     Upper Body Dressing/Undressing Upper body dressing   What is the patient wearing?: Pull over shirt, Bra    Upper body assist Assist Level: Set up assist    Lower Body Dressing/Undressing Lower body dressing      What is the patient wearing?: Underwear/pull up, Pants     Lower body assist Assist for lower body dressing: Supervision/Verbal cueing     Toileting Toileting    Toileting assist Assist for toileting: Contact Guard/Touching assist     Transfers Chair/bed transfer  Transfers assist     Chair/bed transfer assist level: Contact Guard/Touching assist Chair/bed transfer assistive device: Programmer, multimedia   Ambulation assist      Assist level: Contact Guard/Touching assist Assistive device: Walker-rolling Max distance: 80   Walk 10 feet activity   Assist     Assist level: Contact Guard/Touching assist Assistive device: Walker-rolling   Walk 50 feet activity   Assist    Assist level: Contact Guard/Touching assist Assistive device: Walker-rolling    Walk 150 feet activity   Assist    Assist level: Contact Guard/Touching assist Assistive device: Walker-rolling    Walk 10 feet on uneven surface  activity   Assist Walk 10 feet  on uneven surfaces activity did not occur: Safety/medical concerns (decreased strength/activity tolerance)   Assist level: Contact Guard/Touching assist Assistive device: Aeronautical engineer Will patient use wheelchair at discharge?: Yes Type of Wheelchair: Manual    Wheelchair assist level: Supervision/Verbal cueing Max  wheelchair distance: >150 ft    Wheelchair 50 feet with 2 turns activity    Assist        Assist Level: Supervision/Verbal cueing   Wheelchair 150 feet activity     Assist     Assist Level: Supervision/Verbal cueing     Medical Problem List and Plan: 1.    Debility secondary to small bowel ischemia status post exploratory laparotomy lysis of adhesions small bowel resection gastrostomy tube placement 02/08/2020  Continue CIR  2.  Antithrombotics: -DVT/anticoagulation: Chronic Coumadin for history of atrial fibrillation   INR therapeutic on 8/27.  Discussed with pharmacy, plan to DC Lovenox tomorrow.             -antiplatelet therapy: N/A 3. Pain Management: Robaxin as needed  Robaxin to three times per day   Controlled with meds on 8/27  Continue current meds 4. Mood: Prozac 10 mg daily             -antipsychotic agents: N/A 5. Neuropsych: This patient is capable of making decisions on her own behalf. 6. Skin/Wound Care: Routine skin checks 7. Fluids/Electrolytes/Nutrition: Routine in and outs. 8.  Acute blood loss anemia.               Hemoglobin 9.3 on 8/27 9.  Atrial fibrillation.  Continue Cardizem 60 mg every 6 hours.  Chronic Coumadin resumed             Rate controlled 8/27.   Monitor with increased exertion. 10.  Hypothyroidism.  Synthroid 11.  Hypoalbuminemia  Supplement initiated  12.  Hyponatremia  Sodium 134 on 8/23, labs ordered for Monday  Continue to monitor  13. Bowel movements:  Improving 14. S/p PEG on 02/08/20  Not using at present.  LOS: 9 days A FACE TO FACE EVALUATION WAS PERFORMED  Kennedie Pardoe [name] 02/26/2020, 9:02 AM

## 2020-02-26 NOTE — Progress Notes (Signed)
Physical Therapy Session Note  Patient Details  Name: Stephanie Cortez MRN: [id number] Date of Birth: Jul 26, 1933  Today's Date: 02/26/2020 PT Individual Time: 4045-9136 PT Individual Time Calculation (min): 35 min   Short Term Goals: Week 1:  PT Short Term Goal 1 (Week 1): Patient will perform bed mobility with supervision using bed rails to simulate home set up. PT Short Term Goal 1 - Progress (Week 1): Met PT Short Term Goal 2 (Week 1): Patient will perform basic transfers with supervision using LRAD. PT Short Term Goal 2 - Progress (Week 1): Progressing toward goal PT Short Term Goal 3 (Week 1): Patient will ambulate >150 ft using LRAD with supervision. PT Short Term Goal 3 - Progress (Week 1): Progressing toward goal Week 2:  PT Short Term Goal 1 (Week 2): STG=LTG due to LOS  Skilled Therapeutic Interventions/Progress Updates:    Patient received supine in bed, agreeable to PT. She reports 3/10 pain in R LE (premedicated). Patient able to ambulate from room to therapy gym (64f) with SPV-CGA and FWW. Antalgic gait pattern observed. NuStep completed x8 mins for improved R LE AROM/PROM. Noted to have decreased ROM to RLE grossly. Patient asking PT to discuss her balance stating that in previous PT session she was told that she has a "100% chance for falling." PT educated patient on interventions being utilized to assist with minimizing risk for falling including LE strengthening, gait tx, postural control and testing limits of stability. Patient verbalizing understanding. She was able to demonstrate Fair static standing balance on Airex with intermittent B UE support. Patient demonstrating Fair- static standing balance on foam with eyes closed. She will benefit from further balance tx interventions to assist with minimizing risk for falling. Patient returned to bed, bed alarm on, call light within reach.   Therapy Documentation Precautions:  Precautions Precautions: Fall Restrictions Weight  Bearing Restrictions: No    Therapy/Group: Individual Therapy  JKaroline Caldwell PT, DPT, CBIS 02/26/2020, 12:54 PM

## 2020-02-27 ENCOUNTER — Inpatient Hospital Stay (HOSPITAL_COMMUNITY): Payer: Medicare Other

## 2020-02-27 ENCOUNTER — Inpatient Hospital Stay (HOSPITAL_COMMUNITY): Payer: Medicare Other | Admitting: Physical Therapy

## 2020-02-27 LAB — PROTIME-INR
INR: 2.4 — ABNORMAL HIGH (ref 0.8–1.2)
Prothrombin Time: 25.3 seconds — ABNORMAL HIGH (ref 11.4–15.2)

## 2020-02-27 MED ORDER — WARFARIN SODIUM 2.5 MG PO TABS
2.5000 mg | ORAL_TABLET | Freq: Once | ORAL | Status: AC
  Administered 2020-02-27: 2.5 mg via ORAL
  Filled 2020-02-27: qty 1

## 2020-02-27 MED ORDER — BISACODYL 5 MG PO TBEC: 5.0000 mg | DELAYED_RELEASE_TABLET | Freq: Every day | ORAL | Status: DC | PRN

## 2020-02-27 MED ORDER — WARFARIN SODIUM 2 MG PO TABS: 2.0000 mg | ORAL_TABLET | Freq: Once | ORAL | Status: DC

## 2020-02-27 MED ORDER — POLYETHYLENE GLYCOL 3350 17 G PO PACK
17.0000 g | PACK | Freq: Every day | ORAL | Status: DC
  Administered 2020-02-27 – 2020-03-01 (×4): 17 g via ORAL
  Filled 2020-02-27 (×5): qty 1

## 2020-02-27 NOTE — Plan of Care (Signed)
  Problem: Consults Goal: RH GENERAL PATIENT EDUCATION Description: See Patient Education module for education specifics. Outcome: Progressing   Problem: RH BOWEL ELIMINATION Goal: RH STG MANAGE BOWEL WITH ASSISTANCE Description: STG Manage Bowel with min Assistance. Outcome: Progressing Goal: RH STG MANAGE BOWEL W/MEDICATION W/ASSISTANCE Description: STG Manage Bowel with Medication with mod I  Assistance. Outcome: Progressing   Problem: RH BLADDER ELIMINATION Goal: RH STG MANAGE BLADDER WITH ASSISTANCE Description: STG Manage Bladder With min Assistance Outcome: Progressing   Problem: RH SKIN INTEGRITY Goal: RH STG SKIN FREE OF INFECTION/BREAKDOWN Description: Skin will be free of infection/breakdown with min assist Outcome: Progressing Goal: RH STG MAINTAIN SKIN INTEGRITY WITH ASSISTANCE Description: STG Maintain Skin Integrity With min Assistance. Outcome: Progressing Goal: RH STG ABLE TO PERFORM INCISION/WOUND CARE W/ASSISTANCE Description: STG Able To Perform Incision/Wound Care With min Assistance. Outcome: Progressing   Problem: RH SAFETY Goal: RH STG ADHERE TO SAFETY PRECAUTIONS W/ASSISTANCE/DEVICE Description: STG Adhere to Safety Precautions With min Assistance/Device. Outcome: Progressing   Problem: RH PAIN MANAGEMENT Goal: RH STG PAIN MANAGED AT OR BELOW PT'S PAIN GOAL Description: Pain will be managed at or below 3 out of 10 with min assist Outcome: Progressing   Problem: RH KNOWLEDGE DEFICIT GENERAL Goal: RH STG INCREASE KNOWLEDGE OF SELF CARE AFTER HOSPITALIZATION Description: Pt will be able to verbalize self care while at home with min assist Outcome: Progressing

## 2020-02-27 NOTE — Progress Notes (Signed)
Physical Therapy Session Note  Patient Details  Name: Stephanie Cortez MRN: [id number] Date of Birth: 11/07/1933  Today's Date: 02/27/2020 PT Individual Time: 1401-1510 PT Individual Time Calculation (min): 69 min   Short Term Goals: Week 2:  PT Short Term Goal 1 (Week 2): STG=LTG due to LOS  Skilled Therapeutic Interventions/Progress Updates:  Pt received in bed & agreeable to tx. Supine>sit with supervision & hospital bed features. Pt requests to change pants and doffs pants sitting/standing EOB with supervision but requires assistance to thread pants on BLE but is able to pull them over hips. Educated pt on use incentive spirometer & pt return demonstrates use of device. Pt reports more labored breathing the past few days (note left for MD) but SpO2 >90% throughout session on room air with PT educating pt on pursed lip breathing. Sit<>stand with supervision and RW. Pt ambulates room<>gym with RW & supervision with kyphotic posture and decreased weight shifting L<>R, decreased hip flexion RLE during swing phase, and decreased heel strike BLE. Standing cone taps with BUE support on RW with supervision, then with LUE support with min assist with focus on standing tolerance, balance, and weight shifting L<>R. Pt performs 5x sit<>stand with BUE support & supervision, then 2 sets of 5x sit<>stand without BUE support with min increasing to mod assist first set, mod increasing to max assist 2nd set, with multimodal cuing for anterior weight shifting. Back in room, pt left sitting EOB in care of nurse tech.  Pt with c/o "knot" in L side of neck, with scab & slight redness noted, as well as a stitch. Nurse made aware & note left for MD in computer.  Pt very concerned about going home and being able "to walk again" (without AD) with PT educating her on current ability to walk with RW and slowly transition to another AD as she progress in independence with therapy.  Therapy Documentation Precautions:   Precautions Precautions: Fall Restrictions Weight Bearing Restrictions: No    Pain: Denies pain during session  Therapy/Group: [place] 02/27/2020, 3:14 PM

## 2020-02-27 NOTE — Progress Notes (Signed)
Patient has experienced a restless night due to feeling constipated although she has had several small bowel movements over the past couple of days.  She indicates that she takes Dulcolax or Miralax at home as needed for constipation.  Patient does not endorse any abdominal pain, nausea or vomiting s/p small bowel ischemia thought to be likely 2/2 torsion of the mesentery.  Current MAR does not include any scheduled or PRN stool softeners or laxatives.  Interventions tonight included increased water intake, ambulation, and warm prune juice.  0355:  Patient had a resultant large, explosive bowel movement following earlier interventions.  Also, c/o of right hip and leg pain unrelieved by PRN Tylenol.  Patient requests consideration for stronger analgesic.  Kameo Bains B. Rulon Eisenmenger, MSN, RN, CNL [facility]

## 2020-02-27 NOTE — Progress Notes (Signed)
PHYSICAL MEDICINE & REHABILITATION PROGRESS NOTE  Subjective/Complaints: Restless overnight due to constipation. Takes prn Dulcolax and Miralax at home- ordered. BP 126/59, up to 124/64  ROS: Denies CP, SOB, N/V/D  Objective: Vital Signs: Blood pressure (!) 126/59, pulse 74, temperature 98 F (36.7 C), temperature source Oral, resp. rate 16, height 5\' 6"  (1.676 m), weight 73.1 kg, SpO2 97 %. No results found. Recent Labs    02/26/20 0535  WBC 5.5  HGB 9.3*  HCT 30.2*  PLT 378   No results for input(s): NA, K, CL, CO2, GLUCOSE, BUN, CREATININE, CALCIUM in the last 72 hours.  Physical Exam: BP (!) 126/59 (BP Location: Left Arm)   Pulse 74   Temp 98 F (36.7 C) (Oral)   Resp 16   Ht 5\' 6"  (1.676 m)   Wt 73.1 kg   LMP  (LMP Unknown)   SpO2 97%   BMI 26.01 kg/m  General: Alert and oriented x 3, No apparent distress HEENT: Head is normocephalic, atraumatic, PERRLA, EOMI, sclera anicteric, oral mucosa pink and moist, dentition intact, ext ear canals clear,  Neck: Supple without JVD or lymphadenopathy Heart: Reg rate and rhythm. No murmurs rubs or gallops Chest: CTA bilaterally without wheezes, rales, or rhonchi; no distress GI: Non-distended. +PEG. BS+. Skin: Abdomen with Steri-Strips CDI. Left neck incision CDI Psych: Normal mood.  Normal behavior. Musc: No edema in extremities.  No tenderness in extremities. Neuro: Alert Motor: Bilateral upper extremities, left lower extremity: 5/5 proximal distally Right lower extremity: 4+/5 proximally, 5/5 distally, stable    Assessment/Plan: 1. Functional deficits secondary to debility which require 3+ hours per day of interdisciplinary therapy in a comprehensive inpatient rehab setting.  Physiatrist is providing close team supervision and 24 hour management of active medical problems listed below.  Physiatrist and rehab team continue to assess barriers to discharge/monitor patient progress toward functional and  medical goals  Care Tool:  Bathing    Body parts bathed by patient: Right arm, Left arm, Chest, Abdomen, Front perineal area, Buttocks, Right upper leg, Left upper leg, Face, Left lower leg   Body parts bathed by helper: Right lower leg     Bathing assist Assist Level: Minimal Assistance - Patient > 75%     Upper Body Dressing/Undressing Upper body dressing   What is the patient wearing?: Pull over shirt, Bra    Upper body assist Assist Level: Set up assist    Lower Body Dressing/Undressing Lower body dressing      What is the patient wearing?: Underwear/pull up, Pants     Lower body assist Assist for lower body dressing: Supervision/Verbal cueing     Toileting Toileting    Toileting assist Assist for toileting: Contact Guard/Touching assist     Transfers Chair/bed transfer  Transfers assist     Chair/bed transfer assist level: Supervision/Verbal cueing Chair/bed transfer assistive device: Programmer, multimedia   Ambulation assist      Assist level: Supervision/Verbal cueing Assistive device: Walker-rolling Max distance: 150 ft   Walk 10 feet activity   Assist     Assist level: Supervision/Verbal cueing Assistive device: Walker-rolling   Walk 50 feet activity   Assist    Assist level: Supervision/Verbal cueing Assistive device: Walker-rolling    Walk 150 feet activity   Assist    Assist level: Supervision/Verbal cueing Assistive device: Walker-rolling    Walk 10 feet on uneven surface  activity   Assist Walk 10 feet on uneven surfaces activity did not  occur: Safety/medical concerns (decreased strength/activity tolerance)   Assist level: Contact Guard/Touching assist Assistive device: Aeronautical engineer Will patient use wheelchair at discharge?: Yes Type of Wheelchair: Manual    Wheelchair assist level: Supervision/Verbal cueing Max wheelchair distance: >150 ft    Wheelchair 50 feet  with 2 turns activity    Assist        Assist Level: Supervision/Verbal cueing   Wheelchair 150 feet activity     Assist     Assist Level: Supervision/Verbal cueing     Medical Problem List and Plan: 1.    Debility secondary to small bowel ischemia status post exploratory laparotomy lysis of adhesions small bowel resection gastrostomy tube placement 02/08/2020  Continue CIR  2.  Antithrombotics: -DVT/anticoagulation: Chronic Coumadin for history of atrial fibrillation INR 2.4 on 8/28/ D/c Lovenox.               -antiplatelet therapy: N/A 3. Pain Management: Robaxin as needed  Robaxin to three times per day   Controlled with meds on 8/28.   Continue current meds 4. Mood: Prozac 10 mg daily             -antipsychotic agents: N/A 5. Neuropsych: This patient is capable of making decisions on her own behalf. 6. Skin/Wound Care: Routine skin checks 7. Fluids/Electrolytes/Nutrition: Routine in and outs. 8.  Acute blood loss anemia.               Hemoglobin 9.3 on 8/27 9.  Atrial fibrillation.  Continue Cardizem 60 mg every 6 hours.  Chronic Coumadin resumed             Rate controlled 8/28.   Monitor with increased exertion. 10.  Hypothyroidism.  Synthroid 11.  Hypoalbuminemia  Supplement initiated  12.  Hyponatremia  Sodium 134 on 8/23, labs ordered for Monday  Continue to monitor  13. Bowel movements:  Constipated- ordered prn Dulcolax and Miralax.  14. S/p PEG on 02/08/20  Not using at present.  LOS: 10 days A FACE TO FACE EVALUATION WAS PERFORMED  Stephanie Cortez 02/27/2020, 6:40 AM

## 2020-02-27 NOTE — Progress Notes (Addendum)
ANTICOAGULATION CONSULT NOTE - Follow-Up  Pharmacy Consult for warfarin Indication: atrial fibrillation  Patient Measurements: Height: 5\' 6"  (167.6 cm) Weight: 73.1 kg (161 lb 2.5 oz) IBW/kg (Calculated) : 59.3  Vital Signs: Temp: 97.9 F (36.6 C) (08/28 0645) Temp Source: Oral (08/28 0645) BP: 134/64 (08/28 0645) Pulse Rate: 80 (08/28 0645)  Labs: Recent Labs    02/25/20 0743 02/26/20 0535 02/27/20 0457  HGB  --  9.3*  --   HCT  --  30.2*  --   PLT  --  378  --   LABPROT 20.3* 23.9* 25.3*  INR 1.8* 2.2* 2.4*    Estimated Creatinine Clearance: 53.6 mL/min (by C-G formula based on SCr of 0.54 mg/dL).  Assessment: 86 yo female with a history of permanent atrial fibrillation presents with abdominal pain and coffee-ground emesis. The patient was found to have small bowel ischemia and underwent exploratory laparotomy, lysis of adhesions, small bowel resection and gastrostomy tube placementon 02/08/20. The patient's INR upon admit was supratherapeutic at 3.3 and the patient received KCentra 1625 units once prior to surgery.  She is on warfarin PTA for afib and pharmacy consulted to restart on 8/15.   PTA: In April, was on warfarin 2.5 mg TTSS and 1.25 mg MWF. However, daughter endorses that she has been therapeutic on 1.25 mg daily for the last 6 weeks (dosed by home health).  INR is therapeutic at 2.4 today. Due to patient's INR >2, enoxaparin is discontinued. There are no signs or symptoms of bleeding noted. The patient is increasingly eating more of their meals. No new interaction medications were started and no significant drug interactions were identified. The patient can be very sensitive to warfarin dose adjustments.  Goal of Therapy:  INR 2-3 Monitor platelets by anticoagulation protocol: Yes   Plan:  Warfarin 2.5 mg PO tonight x1 dose Obtain daily PT/INR and CBC 3 times per week  Monitor for signs and symptoms of bleeding Monitor drug interactions and oral  intake   Thank you for involving pharmacy in this patient's care.  Shauna Hugh, PharmD, [place]  PGY-1 Pharmacy Resident 02/27/2020 10:28 AM  Please check [email or web address] for unit-specific pharmacy phone numbers.

## 2020-02-28 ENCOUNTER — Encounter (HOSPITAL_COMMUNITY): Payer: Medicare Other | Admitting: Occupational Therapy

## 2020-02-28 ENCOUNTER — Inpatient Hospital Stay (HOSPITAL_COMMUNITY): Payer: Medicare Other | Admitting: Occupational Therapy

## 2020-02-28 LAB — PROTIME-INR
INR: 2.5 — ABNORMAL HIGH (ref 0.8–1.2)
Prothrombin Time: 26.4 seconds — ABNORMAL HIGH (ref 11.4–15.2)

## 2020-02-28 MED ORDER — WARFARIN SODIUM 2.5 MG PO TABS: 2.5000 mg | ORAL_TABLET | Freq: Once | ORAL | Status: DC

## 2020-02-28 MED ORDER — WARFARIN SODIUM 2.5 MG PO TABS: 2.5000 mg | ORAL_TABLET | ORAL | Status: DC

## 2020-02-28 MED ORDER — WARFARIN SODIUM 2.5 MG PO TABS
2.5000 mg | ORAL_TABLET | ORAL | Status: DC
  Administered 2020-02-28 – 2020-03-01 (×2): 2.5 mg via ORAL
  Filled 2020-02-28 (×2): qty 1

## 2020-02-28 MED ORDER — WARFARIN 1.25 MG HALF TABLET: 1.2500 mg | ORAL_TABLET | ORAL | Status: DC

## 2020-02-28 MED ORDER — WARFARIN 1.25 MG HALF TABLET
1.2500 mg | ORAL_TABLET | ORAL | Status: DC
  Administered 2020-02-29: 1.25 mg via ORAL
  Filled 2020-02-28: qty 1

## 2020-02-28 NOTE — Progress Notes (Signed)
[place] PHYSICAL MEDICINE & REHABILITATION PROGRESS NOTE  Subjective/Complaints: Has warmth, pain, tenderness left SCM- there is a suture and scab- scab mostly likely from her itching where the suture was. Asked nurse to bring by suture removal kit and I will remove it this morning.  Had large BM after prune juice 2 days ago.   ROS: Denies CP, SOB, N/V/D  Objective: Vital Signs: Blood pressure (!) 127/54, pulse 61, temperature 97.6 F (36.4 C), resp. rate 19, height 5' 6"  (1.676 m), weight 73.1 kg, SpO2 95 %. DG CHEST PORT 1 VIEW  Result Date: 02/27/2020 CLINICAL DATA:  Shortness of breath. EXAM: PORTABLE CHEST 1 VIEW COMPARISON:  Radiograph 02/08/2020.  CT 07/17/2019 FINDINGS: Stable chronic cardiomegaly. Patchy suprahilar densities correspond to geographic ground-glass opacities on prior CT, and are similar to prior exam. There is a calcified granuloma in the left mid lung. No acute airspace disease. No pneumothorax or pleural effusion. Remote left proximal humerus fracture. IMPRESSION: 1. Stable chronic cardiomegaly. 2. Chronic interstitial lung disease with patchy suprahilar densities that correspond to geographic ground-glass opacities on prior CT, unchanged. Electronically Signed   By: Keith Rake M.D.   On: 02/27/2020 21:24   Recent Labs    02/26/20 0535  WBC 5.5  HGB 9.3*  HCT 30.2*  PLT 378   No results for input(s): NA, K, CL, CO2, GLUCOSE, BUN, CREATININE, CALCIUM in the last 72 hours.  Physical Exam: BP (!) 127/54   Pulse 61   Temp 97.6 F (36.4 C)   Resp 19   Ht 5' 6"  (1.676 m)   Wt 73.1 kg   LMP  (LMP Unknown)   SpO2 95%   BMI 26.01 kg/m  General: Alert and oriented x 3, No apparent distress HEENT: Head is normocephalic, atraumatic, PERRLA, EOMI, sclera anicteric, oral mucosa pink and moist, dentition intact, ext ear canals clear,  Neck: Supple without JVD or lymphadenopathy Heart: Reg rate and rhythm. No murmurs rubs or gallops Chest: CTA bilaterally  without wheezes, rales, or rhonchi; no distress GI: Non-distended. +PEG. BS+. Skin: Abdomen with Steri-Strips CDI. Left neck incision CDI Psych: Normal mood.  Normal behavior. Musc: No edema in extremities.  No tenderness in extremities. Neuro: Alert Motor: Bilateral upper extremities, left lower extremity: 5/5 proximal distally Right lower extremity: 4+/5 proximally, 5/5 distally, stable  Assessment/Plan: 1. Functional deficits secondary to debility which require 3+ hours per day of interdisciplinary therapy in a comprehensive inpatient rehab setting.  Physiatrist is providing close team supervision and 24 hour management of active medical problems listed below.  Physiatrist and rehab team continue to assess barriers to discharge/monitor patient progress toward functional and medical goals  Care Tool:  Bathing    Body parts bathed by patient: Right arm, Left arm, Chest, Abdomen, Front perineal area, Buttocks, Right upper leg, Left upper leg, Face, Left lower leg   Body parts bathed by helper: Right lower leg     Bathing assist Assist Level: Minimal Assistance - Patient > 75%     Upper Body Dressing/Undressing Upper body dressing   What is the patient wearing?: Pull over shirt, Bra    Upper body assist Assist Level: Set up assist    Lower Body Dressing/Undressing Lower body dressing      What is the patient wearing?: Underwear/pull up, Pants     Lower body assist Assist for lower body dressing: Supervision/Verbal cueing     Toileting Toileting    Toileting assist Assist for toileting: Contact Guard/Touching assist  Transfers Chair/bed transfer  Transfers assist     Chair/bed transfer assist level: Supervision/Verbal cueing Chair/bed transfer assistive device: Museum/gallery exhibitions officer assist      Assist level: Supervision/Verbal cueing Assistive device: Walker-rolling Max distance: 150 ft   Walk 10 feet  activity   Assist     Assist level: Supervision/Verbal cueing Assistive device: Walker-rolling   Walk 50 feet activity   Assist    Assist level: Supervision/Verbal cueing Assistive device: Walker-rolling    Walk 150 feet activity   Assist    Assist level: Supervision/Verbal cueing Assistive device: Walker-rolling    Walk 10 feet on uneven surface  activity   Assist Walk 10 feet on uneven surfaces activity did not occur: Safety/medical concerns (decreased strength/activity tolerance)   Assist level: Contact Guard/Touching assist Assistive device: Aeronautical engineer Will patient use wheelchair at discharge?: Yes Type of Wheelchair: Manual    Wheelchair assist level: Supervision/Verbal cueing Max wheelchair distance: >150 ft    Wheelchair 50 feet with 2 turns activity    Assist        Assist Level: Supervision/Verbal cueing   Wheelchair 150 feet activity     Assist     Assist Level: Supervision/Verbal cueing     Medical Problem List and Plan: 1.    Debility secondary to small bowel ischemia status post exploratory laparotomy lysis of adhesions small bowel resection gastrostomy tube placement 02/08/2020  Continue CIR  2.  Antithrombotics: -DVT/anticoagulation: Chronic Coumadin for history of atrial fibrillation INR 2.5 on 8/29/ D/c Lovenox.               -antiplatelet therapy: N/A 3. Pain Management: Robaxin as needed  Robaxin to three times per day   Controlled on 8/29   Continue current meds 4. Mood: Prozac 10 mg daily             -antipsychotic agents: N/A 5. Neuropsych: This patient is capable of making decisions on her own behalf. 6. Skin/Wound Care: Routine skin checks 7. Fluids/Electrolytes/Nutrition: Routine in and outs. 8.  Acute blood loss anemia.               Hemoglobin 9.3 on 8/27 9.  Atrial fibrillation.  Continue Cardizem 60 mg every 6 hours.  Chronic Coumadin resumed             Rate controlled  on 8/29  Monitor with increased exertion. 10.  Hypothyroidism.  Synthroid 11.  Hypoalbuminemia  Supplement initiated  12.  Hyponatremia  Sodium 134 on 8/23, labs ordered for Monday  Continue to monitor  13. Bowel movements:  Constipated- ordered prn Dulcolax and Miralax.  14. S/p PEG on 02/08/20  Not using at present. D/c Tuesday morning.  >35 minutes spent in removing patient's retained suture, education regarding ice and heat, ordering ice TID, discussion of PEG removal, assessment of HR and BP, and INR, discussion regarding constipation and pain, ordering prune juice.   LOS: 11 days A FACE TO FACE EVALUATION WAS PERFORMED  Stephanie Cortez 02/28/2020, 8:50 AM

## 2020-02-28 NOTE — Progress Notes (Signed)
Occupational Therapy Session Note  Patient Details  Name: Stephanie Cortez MRN: [id number] Date of Birth: Nov 15, 1933  Today's Date: 02/28/2020 OT Individual Time: 1001-1058 OT Individual Time Calculation (min): 57 min    Short Term Goals: Week 2:  OT Short Term Goal 1 (Week 2): STG = LTGs due to remaining ELOS  Skilled Therapeutic Interventions/Progress Updates:    Pt greeted at time of session reclined in bed with daughter [name] present, agreeable to OT session wanting to wash up and change clothes. Supine to sit Supervision, sit to stand and ambulated to sink with CS/CGA with RW and performed UB bathing seated in wheelchair with set up. Dried off and donned bra/shirt in the same manner seated. LB bathing with Min A to thoroughly wash RLE with pt attempting to wash with compensatory techniques. LB dressing CGA for underwear/pants and pt able to static stand without UE support to don over hips. Pt had several questions about showering, answered that she would need to have drains/tubes removed first, in order to cover area for shower, which she spoke with the MD about removal this am as she is not using feeding tube. Patient's daughter had questions about upcoming family ed that were answered. Pt up in wheelchair with alarm on, call bell in reach. Pt is very excited to go home later this week.   Therapy Documentation Precautions:  Precautions Precautions: Fall Restrictions Weight Bearing Restrictions: No     Therapy/Group: Individual Therapy  Viona Gilmore 02/28/2020, 12:25 PM

## 2020-02-28 NOTE — Progress Notes (Signed)
Occupational Therapy Session Note  Patient Details  Name: Stephanie Cortez MRN: [id number] Date of Birth: 1934-06-20  Today's Date: 02/28/2020 OT Group Time:  - 60 minutes missed    Skilled Therapeutic Interventions/Progress Updates:    Pt declined participation in group today. 60 minutes missed.    Therapy Documentation Precautions:  Precautions Precautions: Fall Restrictions Weight Bearing Restrictions: No Vital Signs: Therapy Vitals BP: (!) 127/54 ADL: ADL Eating: Set up Grooming: Setup Upper Body Bathing: Setup Lower Body Bathing: Minimal assistance Upper Body Dressing: Minimal assistance Lower Body Dressing: Minimal assistance Toileting: Minimal assistance Toilet Transfer: Minimal assistance     Therapy/Group: Group Therapy  [name] A Dj Senteno 02/28/2020, 7:22 AM

## 2020-02-28 NOTE — Plan of Care (Signed)
  Problem: Consults Goal: RH GENERAL PATIENT EDUCATION Description: See Patient Education module for education specifics. Outcome: Progressing   Problem: RH BOWEL ELIMINATION Goal: RH STG MANAGE BOWEL WITH ASSISTANCE Description: STG Manage Bowel with min Assistance. Outcome: Progressing Goal: RH STG MANAGE BOWEL W/MEDICATION W/ASSISTANCE Description: STG Manage Bowel with Medication with mod I  Assistance. Outcome: Progressing   Problem: RH BLADDER ELIMINATION Goal: RH STG MANAGE BLADDER WITH ASSISTANCE Description: STG Manage Bladder With min Assistance Outcome: Progressing   Problem: RH SKIN INTEGRITY Goal: RH STG SKIN FREE OF INFECTION/BREAKDOWN Description: Skin will be free of infection/breakdown with min assist Outcome: Progressing Goal: RH STG MAINTAIN SKIN INTEGRITY WITH ASSISTANCE Description: STG Maintain Skin Integrity With min Assistance. Outcome: Progressing Goal: RH STG ABLE TO PERFORM INCISION/WOUND CARE W/ASSISTANCE Description: STG Able To Perform Incision/Wound Care With min Assistance. Outcome: Progressing   Problem: RH SAFETY Goal: RH STG ADHERE TO SAFETY PRECAUTIONS W/ASSISTANCE/DEVICE Description: STG Adhere to Safety Precautions With min Assistance/Device. Outcome: Progressing   Problem: RH PAIN MANAGEMENT Goal: RH STG PAIN MANAGED AT OR BELOW PT'S PAIN GOAL Description: Pain will be managed at or below 3 out of 10 with min assist Outcome: Progressing   Problem: RH KNOWLEDGE DEFICIT GENERAL Goal: RH STG INCREASE KNOWLEDGE OF SELF CARE AFTER HOSPITALIZATION Description: Pt will be able to verbalize self care while at home with min assist Outcome: Progressing

## 2020-02-28 NOTE — Progress Notes (Addendum)
ANTICOAGULATION CONSULT NOTE - Follow-Up  Pharmacy Consult for warfarin Indication: atrial fibrillation  Patient Measurements: Height: 5\' 6"  (167.6 cm) Weight: 73.1 kg (161 lb 2.5 oz) IBW/kg (Calculated) : 59.3  Vital Signs: Temp: 97.6 F (36.4 C) (08/29 0316) BP: 127/54 (08/29 0538) Pulse Rate: 61 (08/29 0316)  Labs: Recent Labs    02/26/20 0535 02/27/20 0457 02/28/20 0455  HGB 9.3*  --   --   HCT 30.2*  --   --   PLT 378  --   --   LABPROT 23.9* 25.3* 26.4*  INR 2.2* 2.4* 2.5*    Estimated Creatinine Clearance: 53.6 mL/min (by C-G formula based on SCr of 0.54 mg/dL).  Assessment: 86 yo female with a history of permanent atrial fibrillation presents with abdominal pain and coffee-ground emesis. The patient was found to have small bowel ischemia and underwent exploratory laparotomy, lysis of adhesions, small bowel resection and gastrostomy tube placementon 02/08/20. The patient's INR upon admit was supratherapeutic at 3.3 and the patient received KCentra 1625 units once prior to surgery.  She is on warfarin PTA for afib and pharmacy consulted to restart on 8/15.   PTA: In April, was on warfarin 2.5 mg TTSS and 1.25 mg MWF. However, daughter endorses that she has been therapeutic on 1.25 mg daily for the last 6 weeks (dosed by home health).  INR is therapeutic at 2.5 today. The patient has now had 3 therapeutic INR readings in a row. There are no signs or symptoms of bleeding noted. The patient continues to eat more portion of meals than previous and will continue to monitor this. No new interacting medications were started and no significant drug interactions were identified. The patient can be very sensitive to warfarin dose adjustments.  Goal of Therapy:  INR 2-3 Monitor platelets by anticoagulation protocol: Yes   Plan:  Warfarin 2.5 mg PO daily except 1.25 mg on Mon and Thurs Q MWF PT/INR and QMon CBC    Monitor drug interactions and oral intake   Thank you for  involving pharmacy in this patient's care.  Shauna Hugh, PharmD, [place]  PGY-1 Pharmacy Resident 02/28/2020 10:35 AM  Please check [email or web address] for unit-specific pharmacy phone numbers.    I discussed / reviewed the pharmacy note by Dr. Tonia Brooms and I agree with the resident's findings and plans as documented.

## 2020-02-29 ENCOUNTER — Inpatient Hospital Stay (HOSPITAL_COMMUNITY): Payer: Medicare Other

## 2020-02-29 ENCOUNTER — Inpatient Hospital Stay (HOSPITAL_COMMUNITY): Payer: Medicare Other | Admitting: Occupational Therapy

## 2020-02-29 LAB — BASIC METABOLIC PANEL
Anion gap: 8 (ref 5–15)
BUN: 10 mg/dL (ref 8–23)
CO2: 23 mmol/L (ref 22–32)
Calcium: 9.3 mg/dL (ref 8.9–10.3)
Chloride: 103 mmol/L (ref 98–111)
Creatinine, Ser: 0.53 mg/dL (ref 0.44–1.00)
GFR calc Af Amer: >60 mL/min (ref >60)
GFR calc non Af Amer: >60 mL/min (ref >60)
Glucose, Bld: 99 mg/dL (ref 70–99)
Potassium: 3.8 mmol/L (ref 3.5–5.1)
Sodium: 134 mmol/L — ABNORMAL LOW (ref 135–145)

## 2020-02-29 LAB — CBC
HCT: 30.6 % — ABNORMAL LOW (ref 36.0–46.0)
Hemoglobin: 9.6 g/dL — ABNORMAL LOW (ref 12.0–15.0)
MCH: 28.8 pg (ref 26.0–34.0)
MCHC: 31.4 g/dL (ref 30.0–36.0)
MCV: 91.9 fL (ref 80.0–100.0)
Platelets: 407 10*3/uL — ABNORMAL HIGH (ref 150–400)
RBC: 3.33 MIL/uL — ABNORMAL LOW (ref 3.87–5.11)
RDW: 15 % (ref 11.5–15.5)
WBC: 6.1 10*3/uL (ref 4.0–10.5)
nRBC: 0 % (ref 0.0–0.2)

## 2020-02-29 LAB — PROTIME-INR
INR: 2.6 — ABNORMAL HIGH (ref 0.8–1.2)
Prothrombin Time: 27 seconds — ABNORMAL HIGH (ref 11.4–15.2)

## 2020-02-29 NOTE — Progress Notes (Signed)
PHYSICAL MEDICINE & REHABILITATION PROGRESS NOTE  Subjective/Complaints:  Pt notes she's supposed to get PEG out this week sometimes- Tuesday or Wednesday.   Has some swelling on L SCM/neck where they removed stitch yesterday- denies pain in that area.   Had some unusual pain in R thigh yesterday- tylenol usually treats it, but this time it didn't- was SO painful- which is rare for her- but better now.     ROS:  Pt denies SOB, abd pain, CP, N/V/C/D, and vision changes   Objective: Vital Signs: Blood pressure 125/64, pulse 65, temperature 98.5 F (36.9 C), temperature source Oral, resp. rate 18, height 5\' 6"  (1.676 m), weight 73.1 kg, SpO2 95 %. DG CHEST PORT 1 VIEW  Result Date: 02/27/2020 CLINICAL DATA:  Shortness of breath. EXAM: PORTABLE CHEST 1 VIEW COMPARISON:  Radiograph 02/08/2020.  CT 07/17/2019 FINDINGS: Stable chronic cardiomegaly. Patchy suprahilar densities correspond to geographic ground-glass opacities on prior CT, and are similar to prior exam. There is a calcified granuloma in the left mid lung. No acute airspace disease. No pneumothorax or pleural effusion. Remote left proximal humerus fracture. IMPRESSION: 1. Stable chronic cardiomegaly. 2. Chronic interstitial lung disease with patchy suprahilar densities that correspond to geographic ground-glass opacities on prior CT, unchanged. Electronically Signed   By: Keith Rake M.D.   On: 02/27/2020 21:24   Recent Labs    02/29/20 0449  WBC 6.1  HGB 9.6*  HCT 30.6*  PLT 407*   Recent Labs    02/29/20 0449  NA 134*  K 3.8  CL 103  CO2 23  GLUCOSE 99  BUN 10  CREATININE 0.53  CALCIUM 9.3    Physical Exam: BP 125/64 (BP Location: Right Arm)   Pulse 65   Temp 98.5 F (36.9 C) (Oral)   Resp 18   Ht 5\' 6"  (1.676 m)   Wt 73.1 kg   LMP  (LMP Unknown)   SpO2 95%   BMI 26.01 kg/m  General: sitting up EOB eating breakfast, rubbing L neck, NAD HEENT: conjugate gaze Heart: RRR Chest: CTA B/L-  no W/R/R- good air movement GI: Soft, NT, ND, (+)BS  +PEG.  Skin: Abdomen with Steri-Strips CDI. Also L neck over L SCM has a little swelling/irritation from previous suture Psych: appropriate Musc: No edema in extremities.  No tenderness in extremities. Neuro: Alert Motor: Bilateral upper extremities, left lower extremity: 5/5 proximal distally Right lower extremity: 4+/5 proximally, 5/5 distally, stable  Assessment/Plan: 1. Functional deficits secondary to debility which require 3+ hours per day of interdisciplinary therapy in a comprehensive inpatient rehab setting.  Physiatrist is providing close team supervision and 24 hour management of active medical problems listed below.  Physiatrist and rehab team continue to assess barriers to discharge/monitor patient progress toward functional and medical goals  Care Tool:  Bathing    Body parts bathed by patient: Right arm, Left arm, Chest, Abdomen, Front perineal area, Buttocks, Right upper leg, Left upper leg, Face, Left lower leg   Body parts bathed by helper: Right lower leg     Bathing assist Assist Level: Supervision/Verbal cueing     Upper Body Dressing/Undressing Upper body dressing   What is the patient wearing?: Pull over shirt, Bra    Upper body assist Assist Level: Independent    Lower Body Dressing/Undressing Lower body dressing      What is the patient wearing?: Underwear/pull up, Pants     Lower body assist Assist for lower body dressing: Supervision/Verbal cueing  Toileting Toileting    Toileting assist Assist for toileting: Supervision/Verbal cueing     Transfers Chair/bed transfer  Transfers assist     Chair/bed transfer assist level: Supervision/Verbal cueing Chair/bed transfer assistive device: Walker, Clinical biochemist   Ambulation assist      Assist level: Supervision/Verbal cueing Assistive device: Walker-rolling Max distance: 186ft   Walk 10 feet  activity   Assist     Assist level: Supervision/Verbal cueing Assistive device: Walker-rolling   Walk 50 feet activity   Assist    Assist level: Supervision/Verbal cueing Assistive device: Walker-rolling    Walk 150 feet activity   Assist    Assist level: Supervision/Verbal cueing Assistive device: Walker-rolling    Walk 10 feet on uneven surface  activity   Assist Walk 10 feet on uneven surfaces activity did not occur: Safety/medical concerns (decreased strength/activity tolerance)   Assist level: Contact Guard/Touching assist Assistive device: Aeronautical engineer Will patient use wheelchair at discharge?: Yes Type of Wheelchair: Manual    Wheelchair assist level: Supervision/Verbal cueing Max wheelchair distance: >150 ft    Wheelchair 50 feet with 2 turns activity    Assist        Assist Level: Supervision/Verbal cueing   Wheelchair 150 feet activity     Assist     Assist Level: Supervision/Verbal cueing     Medical Problem List and Plan: 1.    Debility secondary to small bowel ischemia status post exploratory laparotomy lysis of adhesions small bowel resection gastrostomy tube placement 02/08/2020  Continue CIR  2.  Antithrombotics: -DVT/anticoagulation: Chronic Coumadin for history of atrial fibrillation INR 2.5 on 8/29/ D/c Lovenox.  8/30- INR 2.6- per pharmacy              -antiplatelet therapy: N/A 3. Pain Management: Robaxin as needed  Robaxin to three times per day   Controlled on 8/29   Continue current meds 4. Mood: Prozac 10 mg daily             -antipsychotic agents: N/A 5. Neuropsych: This patient is capable of making decisions on her own behalf. 6. Skin/Wound Care: Routine skin checks 7. Fluids/Electrolytes/Nutrition: Routine in and outs. 8.  Acute blood loss anemia.               Hemoglobin 9.3 on 8/27 9.  Atrial fibrillation.  Continue Cardizem 60 mg every 6 hours.  Chronic Coumadin resumed              Rate controlled on 8/29  Monitor with increased exertion. 10.  Hypothyroidism.  Synthroid 11.  Hypoalbuminemia  Supplement initiated  12.  Hyponatremia  Sodium 134 on 8/23, labs ordered for Monday  8/30- Na still is 134  Continue to monitor  13. Bowel movements:  Constipated- ordered prn Dulcolax and Miralax.  14. S/p PEG on 02/08/20  Not using at present. D/c Tuesday morning.  8/30- d/c tomorrow    LOS: 12 days A FACE TO FACE EVALUATION WAS PERFORMED  Alexsandro Salek 02/29/2020, 1:51 PM

## 2020-02-29 NOTE — Progress Notes (Signed)
Physical Therapy Session Note  Patient Details  Name: Stephanie Cortez MRN: [id number] Date of Birth: 1934/01/15  Today's Date: 02/29/2020 PT Individual Time: 1300-1400 PT Individual Time Calculation (min): 60 min   Short Term Goals: Week 2:  PT Short Term Goal 1 (Week 2): STG=LTG due to LOS  Skilled Therapeutic Interventions/Progress Updates:    Pt received supine in bed, agreeable to PT session. Pt reports 6/10 R hip pain, denies request for pain medicine intervention. Pt performed supine<>sit with supervision with bed rails and HOB slightly elevated. Stand<>pivot from bed to w/c with CGA and RW. Pt transported to [employer]	[place] in her w/c where she ambulated 3x120ft with close supervision and RW, increased work of breathing towards end of trials. VC for improving upright/erect posture, step length, reducing RLE external rotation, and safety approach when returning to w/c. Pt instructed on RPE scale and energy conservation strategies to use at home, pt thankful and verbalized understanding. Pt performed unsupported standing balance on blue airex foam pad with feet apart and feet together x54min each position, therapist providing CGA/minA for truncal stability. After seated rest break, pt requests further attempts at ambulation and defers balance activities, therefore pt ambulated an additional 11ft + 139ft with supervision and RW, focusing on endurance with deep breathing and RPE scale where she reports 8/10 RPE after ~138ft. Pt transported back to her room in her w/c where she remained seated in w/c with needs in reach at end of session.   Therapy Documentation Precautions:  Precautions Precautions: Fall Restrictions Weight Bearing Restrictions: No  Therapy/Group: Individual Therapy  Oluwaseyi Raffel P Tiphanie Vo PT 02/29/2020, 1:42 PM

## 2020-02-29 NOTE — Discharge Summary (Signed)
Physician Discharge Summary  Patient ID: AMMI HUTT MRN: [id number] DOB/AGE: 84-Jul-1935 84 y.o.  Admit date: 02/17/2020 Discharge date: 03/02/2020  Discharge Diagnoses:  Principal Problem:   Debility Active Problems:   Sepsis ([place])   Subtherapeutic international normalized ratio (INR)   Post-operative pain   S/P percutaneous endoscopic gastrostomy (PEG) tube placement (HCC)   Malnutrition of moderate degree DVT prophylaxis Acute blood loss anemia Hypothyroidism Atrial fibrillation Interstitial lung disease Systolic congestive heart failure Hypertension Diverticulitis  Discharged Condition: Stable  Significant Diagnostic Studies: DG CHEST PORT 1 VIEW  Result Date: 02/27/2020 CLINICAL DATA:  Shortness of breath. EXAM: PORTABLE CHEST 1 VIEW COMPARISON:  Radiograph 02/08/2020.  CT 07/17/2019 FINDINGS: Stable chronic cardiomegaly. Patchy suprahilar densities correspond to geographic ground-glass opacities on prior CT, and are similar to prior exam. There is a calcified granuloma in the left mid lung. No acute airspace disease. No pneumothorax or pleural effusion. Remote left proximal humerus fracture. IMPRESSION: 1. Stable chronic cardiomegaly. 2. Chronic interstitial lung disease with patchy suprahilar densities that correspond to geographic ground-glass opacities on prior CT, unchanged. Electronically Signed   By: Keith Rake M.D.   On: 02/27/2020 21:24   DG CHEST PORT 1 VIEW  Result Date: 02/08/2020 CLINICAL DATA:  86 year old female status post left-sided PICC placement. EXAM: PORTABLE CHEST 1 VIEW COMPARISON:  Chest radiograph dated 11/15/2019. FINDINGS: Left-sided PICC with tip in the left innominate vein approximately 2.2 cm proximal to the SVC. No pneumothorax. Bilateral upper lobe interstitial and reticular densities progressed since the prior radiograph. No pleural effusion. Stable cardiomegaly. Atherosclerotic calcification of the aorta. Degenerative changes of the  spine. Old left humeral neck fracture. IMPRESSION: 1. Left-sided PICC with tip in the left innominate vein. No pneumothorax. 2. Bilateral upper lobe interstitial and reticular densities progressed since the prior radiograph. Electronically Signed   By: Anner Crete M.D.   On: 02/08/2020 20:33   DG Abd 2 Views  Result Date: 02/11/2020 CLINICAL DATA:  Abdominal pain post exploratory laparotomy, small-bowel resection, and lysis of adhesions EXAM: ABDOMEN - 2 VIEW COMPARISON:  Portable exam 1033 hours compared to CT abdomen and pelvis 02/08/2020 FINDINGS: Surgical drain projects over LEFT mid abdomen. Skin clips from prior midline laparotomy. Nonobstructive bowel gas pattern. Minimal residual contrast distal colon. No bowel dilatation or bowel wall thickening. Bones demineralized with multilevel degenerative disc disease changes of the thoracolumbar spine. IMPRESSION: Nonobstructive bowel gas pattern. Electronically Signed   By: Lavonia Dana M.D.   On: 02/11/2020 12:13   CT Angio Abd/Pel W and/or Wo Contrast  Result Date: 02/08/2020 CLINICAL DATA:  86 year old female with abdominal pain and possible GI bleeding. EXAM: CTA ABDOMEN AND PELVIS WITHOUT AND WITH CONTRAST TECHNIQUE: Multidetector CT imaging of the abdomen and pelvis was performed using the standard protocol during bolus administration of intravenous contrast. Multiplanar reconstructed images and MIPs were obtained and reviewed to evaluate the vascular anatomy. CONTRAST:  119mL OMNIPAQUE IOHEXOL 350 MG/ML SOLN COMPARISON:  10/25/2011 FINDINGS: VASCULAR Aorta: Mild atherosclerotic changes of the thoracic aorta and mild to moderate atherosclerosis of the abdominal aorta. No dissection. No periaortic fluid. Celiac: Celiac artery patent at the origin without high-grade stenosis. Mild atherosclerosis. Branches are patent and of relatively normal caliber. Partially calcified aneurysm of splenic artery, posterior to the stomach and near the splenic hilum  measuring 8 mm. SMA: The SMA is patent at the origin with minimal atherosclerosis. SMA tapers to very small caliber within the mesentery. There is no occlusion identified on the CT angiogram. Renals: -  Right: Right renal artery patent with minimal atherosclerosis. Coronal images demonstrates some beading pattern of the right renal artery. - Left: Left renal artery patent with minimal atherosclerosis. Coronal images demonstrate questionable subtle beading pattern of the left renal artery. IMA: IMA is patent Right lower extremity: Unremarkable course, caliber, and contour of the right iliac system. Mild atherosclerosis. No aneurysm, dissection, or occlusion. Hypogastric artery is patent. Common femoral artery patent. Proximal SFA and profunda femoris patent. Left lower extremity: Unremarkable course, caliber, and contour of the left iliac system. Mild atherosclerosis. No aneurysm, dissection, or occlusion. Hypogastric artery is patent. Common femoral artery patent. Proximal SFA and profunda femoris patent. Veins: Portal system: Portal venous system patent. No esophageal varices. No evidence of gastric varices. Splenic vein patent of small caliber. The SMV is patent at the confluence with the portal vein/splenic vein. Distal to the confluence, SMV and the SMV branches are completely decompressed and essentially non visible. Review of the MIP images confirms the above findings. NON-VASCULAR Lower chest: Emphysematous changes at the lung bases. Hepatobiliary: Unremarkable appearance of the liver. Unremarkable gall bladder. Pancreas: Unremarkable. Spleen: I few punctate calcifications within the splenic parenchyma possibly secondary to prior granulomatous disease. Adrenals/Urinary Tract: - Right adrenal gland: Unremarkable - Left adrenal gland: Unremarkable. - Right kidney: No hydronephrosis, nephrolithiasis, inflammation, or ureteral dilation. Small hypoechoic/nonenhancing lesions which are too small to characterize. -  Left Kidney: No hydronephrosis, nephrolithiasis, inflammation, or ureteral dilation. Small hypoenhancing hypodense cystic lesions which are too small to characterize. - Urinary Bladder: Unremarkable. Stomach/Bowel: - Stomach: Hiatal hernia. Stomach is decompressed. No fluid fluid level. No accumulation of contrast to suggest extravasation. No focal wall thickening. Mucosa of the stomach enhances within normal limits. - Small bowel: Proximal small bowel decompressed with typical enhancement pattern expected within the small bowel on the venous phase. There is a long segment of small bowel within the mid and lower abdomen demonstrating borderline dilation, extensive edema within the mesentery, and non enhancement on the arterial and venous phases. Single loop within the mid abdomen anterior to the sacral base on image 131 of series 17 demonstrate is differential enhancement, with enhancement proximally and non enhancement within the adjacent segment. Presumed transition point within the bowel on image 125 of series 17, right abdomen. There is no evidence of accumulation of high density fluid within small bowel that would suggest active extravasation. - Appendix: Appendix not visualized. - Colon: Terminal ileum at the IC valve is decompressed. Proximal colon appears to enhance within normal limits without focal inflammatory changes. Some retained high density material within the transverse colon. Diverticular disease without focal inflammatory changes or distention of the left colon and sigmoid colon. Lymphatic: No lymphadenopathy. Mesenteric: Trace fluid measuring 20 Hounsfield units adjacent to the spleen and the liver. Trace fluid within the pericolic gutter bilaterally. Extensive edema within the small bowel mesentery associated with a nonenhancing bowel loops. Trace free fluid within the anatomic pelvis. No free air. No portal venous gas. Reproductive: Changes of hysterectomy Other: Small fat containing umbilical  hernia. Musculoskeletal: Surgical changes of right hip arthroplasty with significant streak artifact limiting evaluation of the pelvis. Degenerative changes of the left hip. Degenerative changes of the visualized thoracolumbar spine. No acute displaced fracture. Osteopenia. IMPRESSION: Small bowel ischemia within the mid and lower abdomen, demonstrating edematous, nonenhancing pattern. This is favored to be secondary to mesenteric adhesions/torsion, and not from thromboembolic arterial disease. Surgical consultation is indicated. CT is negative for evidence of acute gastrointestinal hemorrhage. Small volume reactive ascites.  These above preliminary results were discussed by telephone at the time of interpretation on 02/08/2020 at 2:17 pm with Dr. Herbie Baltimore LOCKWOOD Minimal aortic atherosclerosis. Aortic Atherosclerosis (ICD10-I70.0). No significant mesenteric arterial disease. Subtle beading changes of the bilateral renal arteries, right greater than left, which may indicate fibromuscular dysplasia. 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 Additional ancillary findings as above. Signed, Dulcy Fanny. Dellia Nims, RPVI [facility] Radiology Electronically Signed   By: Corrie Mckusick D.O.   On: 02/08/2020 14:48    Labs:  Basic Metabolic Panel: Recent Labs  Lab 02/29/20 0449  NA 134*  K 3.8  CL 103  CO2 23  GLUCOSE 99  BUN 10  CREATININE 0.53  CALCIUM 9.3    CBC: Recent Labs  Lab 02/24/20 0600 02/26/20 0535 02/29/20 0449  WBC 5.8 5.5 6.1  HGB 9.2* 9.3* 9.6*  HCT 29.4* 30.2* 30.6*  MCV 90.5 92.4 91.9  PLT 377 378 407*    CBG: No results for input(s): GLUCAP in the last 168 hours.  Family history.  Mother with hypertension and CVA.  Father with lung cancer.  Sister with pancreatic cancer denies any esophageal cancer or colon cancer  Brief HPI:   Stephanie Cortez is a 70 y.o. right-handed female with history of atrial  fibrillation maintained on Coumadin, interstitial lung disease maintained on oxygen, anxiety, diastolic congestive heart failure, COPD, hypertension, history of right hip fracture with hemiarthroplasty received inpatient rehab services 10/30/2019 to 11/16/2019, colitis with diverticulitis.  Per chart review patient lives alone independent with assistive device.  Presented 02/08/2020 with abdominal pain coffee-ground emesis as well as nausea and vomiting.  CT angiogram showed what appeared to be small bowel ischemia in the lower mid abdomen likely secondary to torsion of the mesentery.  She was given Kcentra to reverse INR of 3.3.  Admission chemistry sodium 132 glucose 175 WBC 18,500 hemoglobin 13.9 lactic acid 2.4.  Patient underwent exploratory laparotomy lysis of adhesions small bowel resection gastrostomy tube placement 02/08/2020 per Dr.Tsuei.  Surgical pathology hemorrhagic necrosis consistent with ischemia.  Chronic Coumadin has been resumed.  Acute blood loss anemia 9.6 latest INR of 1.4.  Therapy evaluations completed and patient was admitted for a comprehensive rehab program   Hospital Course: MARTHELLA OSORNO was admitted to rehab 02/17/2020 for inpatient therapies to consist of PT, ST and OT at least three hours five days a week. Past admission physiatrist, therapy team and rehab RN have worked together to provide customized collaborative inpatient rehab.  Pertaining to patient's small bowel ischemia status post exploratory laparotomy lysis of adhesions small bowel resection with gastrostomy tube placement 02/08/2020.  Surgical site healing nicely patient would follow-up with general surgery.  She remained on chronic Coumadin followed by Dr. [name] [phone].  A home health nurse has been arranged.  Pain management use of Robaxin as directed.  Mood stabilization with Prozac.  Acute blood loss anemia stable hemoglobin 9.3.  Patient did have a history of atrial fibrillation cardiac rate controlled remained  on Cardizem as well as Coumadin.  Hypothyroidism with Synthroid as directed.  She was now on mechanical soft diet G-tube not being used placed 02/08/2020 would follow-up with general surgery.   Blood pressures were monitored on TID basis and controlled     Rehab course: During patient's stay in rehab weekly team conferences were held to monitor patient's progress, set goals and discuss barriers to discharge. At admission, patient required moderate assist  for rolling moderate assist for side-lying to sitting moderate assist supine to sit ambulating 35 feet minimal assistance  Physical exam.  Blood pressure 121/52 pulse 77 temperature 98.5 respiration 16 oxygen saturation 97% room air Constitutional.  No acute distress HEENT Head.  Normocephalic and atraumatic Neck.  Supple nontender no JVD without thyromegaly Cardiac irregular irregular Abdomen.  Soft mild distention positive bowel sounds Respiratory effort normal no respiratory distress without wheeze Skin.  Midline incision site clean and dry Neurological.  Alert oriented follows commands Motor.  Bilateral upper extremities left lower extremity 5/5 proximal distal Right lower extremity hip flexors 4+/5 knee extension 4+/5 ankle dorsiflexion 5/5    /She  has had improvement in activity tolerance, balance, postural control as well as ability to compensate for deficits. Marlana Salvage has had improvement in functional use RUE/LUE  and RLE/LLE as well as improvement in awareness.  Working with energy conservation techniques.  Patient can change her pants sitting or standing edge of bed supervision but requires some assistance to thread pants bilateral lower extremities.  Sit to stand with supervision.  Ambulates from her room to the gymnasium rolling walker supervision.  Set up for grooming set up upper body bathing minimal assist lower body bathing minimal assist upper body dressing.  Full family teaching completed plan discharge to home        Disposition: Discharged home    Diet: Soft  Special Instructions: No driving smoking or alcohol  Home health nurse to check INR 03/04/2020 results to Dr. [name] [phone] fax [phone]  Follow-up with Dr.Tseui [phone] for removal of G-tube  Medications at discharge. 1.  Tylenol as needed 2.  Cardizem 60 mg every 6 hours 3.  Pepcid 20 mg p.o. daily 4.  Prozac 10 mg p.o. nightly 5.  Synthroid 50 mcg p.o. daily 6.  Melatonin 10 mg p.o. nightly as needed sleep 7.  Robaxin 500 mg p.o. 3 times daily 8.  Dulera 2 puffs 2 times daily 9.  MiraLAX daily as directed 10.  Pravachol 40 mg p.o. daily 11.  Coumadin 2.5 mg daily except 1.25 mg Monday Wednesday Friday  30-35 minutes were spent completing discharge summary and discharge planning  Discharge Instructions    Ambulatory referral to Physical Medicine Rehab   Complete by: As directed    Moderate complexity follow-up 1 to 2 weeks debility/multimedical       Follow-up Information    Jamse Arn, MD Follow up.   Specialty: Physical Medicine and Rehabilitation Why: Office to call for appointment Contact information: [address] [place] [address] [phone]        Donnie Mesa, MD Follow up.   Specialty: General Surgery Why: Call for appointment Contact information: [place] [address] [phone]        Jerene Bears, MD Follow up.   Specialty: Gastroenterology Why: Call for appointment as needed Contact information: [address] [place] [address] [phone]        [name], MD Follow up.   Specialty: Family Medicine Contact information: [address], [place] [address] [phone]        Deboraha Sprang, MD .   Specialty: Cardiology Contact information: [phone] [address] [place] [address] [phone]               Signed: Cathlyn Parsons 03/01/2020, 5:23 AM

## 2020-02-29 NOTE — Progress Notes (Signed)
Physical Therapy Session Note  Patient Details  Name: Stephanie Cortez MRN: [id number] Date of Birth: [birth year]  Today's Date: 02/29/2020 PT Individual Time: 1100-1154 and 1415-1457 PT Individual Time Calculation (min): 54 min and 42 min  Short Term Goals: Week 1:  PT Short Term Goal 1 (Week 1): Patient will perform bed mobility with supervision using bed rails to simulate home set up. PT Short Term Goal 1 - Progress (Week 1): Met PT Short Term Goal 2 (Week 1): Patient will perform basic transfers with supervision using LRAD. PT Short Term Goal 2 - Progress (Week 1): Progressing toward goal PT Short Term Goal 3 (Week 1): Patient will ambulate >150 ft using LRAD with supervision. PT Short Term Goal 3 - Progress (Week 1): Progressing toward goal Week 2:  PT Short Term Goal 1 (Week 2): STG=LTG due to LOS  Skilled Therapeutic Interventions/Progress Updates:   Treatment Session 1: 1100-1154 54 min Received pt sitting in WC, pt agreeable to therapy, and denied any pain during session. Session with emphasis on discharge planning, functional mobility/transfers, generalized strengthening, HEP education, dynamic standing balance/coordination, ambulation, stair navigation, and improved activity tolerance. Therapist provided HEP consisting of the following exercises and educated pt on frequency/technique: -Mini Squat with Counter Support - 1 x daily - 7 x weekly - 3 sets - 10 reps -Standing Hip Abduction with Counter Support - 1 x daily - 7 x weekly - 3 sets - 10 reps -Standing Hip Extension with Counter Support - 1 x daily - 7 x weekly - 3 sets - 10 reps -Standing March with Counter Support - 1 x daily - 7 x weekly - 3 sets - 10 reps -Heel rises with counter support - 1 x daily - 7 x weekly - 3 sets - 15 reps -Supine Bridge - 1 x daily - 7 x weekly - 3 sets - 12 reps Pt ambulated 167f x 2 trials to/from therapy gym with RW and supervision/CGA. Pt ambulated additional 248fx 2 trials to/from staircase  and navigated 8 steps with 2 rails CGA/supervision ascending and descending with a step to pattern with verbal cues for stepping sequence "up with the good and down with the bad". Pt ambulated additional 5040fith RW and supervision to rehab apartment and performed ambulatory furniture transfer onto couch and recliner with min A overall to stand due to low surface. Pt ambulated additional ~76f37froughout kitchen and simulated steps to make a meal. Pt able to open fridge, grab food items off top and middle shelf, and transport them to counter (~2ft 16fy) using RW and close supervision/CGA. Informed pt that at home she could put items in in RW bag to transport items to/from counter top. Worked on dynamic standing balance and reaching outside BOS to open oven and put/remove baking pan with RW and CGA. Pt able to side step along counter tops with bilateral UE support and remove items from cabinets with close supervision. Pt required multiple standing/sitting rest breaks throughout session due to fatigue and poor activity tolerance. Pt transferred sit<>stand with RW min A from chair without armrests and therapist educated pt on strategies to stand from chairs without armrests. Discussed home setup and strategies to navigate through house (bedroom, bathroom, and kitchen) and transfer on/off various types of furniture. Concluded session with pt supine in bed, needs within reach, and bed alarm on.   Treatment Session 2: [id number] Received pt sitting in WC, pt agreeable to therapy, and denied any pain initially but reported  increased R hip pain to 6/10 with exercises. RN notified and planning to administer medication. Session with emphasis on functional mobility/transfers, toileting, generalized strengthening, dynamic standing balance/coordination, ambulation, and improved activity tolerance. Pt reported urge to use restroom and ambulated 73f x 2 trials to/from bathroom with RW and supervision. Pt able to void  and perform hygiene independently. Pt able to manage clothes with supervision and stand at sink and wash hands with supervision. Pt requested to practice exercises from HEP therapist provided this morning. Pt performed the following exercises standing at sink with supervision and verbal cues for technique: -mini squats x10 -hip abduction x10 bilaterally -hip extension x10 bilaterally -hip flexion x10 bilaterally -heel raises 2x15 Pt stated "I'm so glad I'm practicing these here" multiple times while practicing exercises on HEP printout. Pt ambulated 552fwith RW and supervision back to bed and transferred sit<>supine with supervision. [facility] by pulling on headboard. Concluded session with pt supine in bed, needs within reach, and bed alarm on.    Therapy Documentation Precautions:  Precautions Precautions: Fall Restrictions Weight Bearing Restrictions: No  Therapy/Group: Individual Therapy AnAlfonse AlpersT, DPT   02/29/2020, 7:24 AM

## 2020-02-29 NOTE — Progress Notes (Signed)
Occupational Therapy Session Note  Patient Details  Name: Stephanie Cortez MRN: [id number] Date of Birth: 1934/05/12  Today's Date: 02/29/2020 OT Individual Time: 3435-6861 OT Individual Time Calculation (min): 45 min    Short Term Goals: Week 2:  OT Short Term Goal 1 (Week 2): STG = LTGs due to remaining ELOS  Skilled Therapeutic Interventions/Progress Updates:    Treatment session with focus on self-care retraining with increased independence. Pt received supine in bed reporting a "popping" sensation in RLE at knee - notified RN.  Pt expressing desire to engage in bathing and dressing.  Upon completing bed mobility, pt reports need to toilet.  Pt completed sit > stand and ambulation to [facility] over toilet with RW with supervision.  Pt completed toileting distant supervision.  Pt ambulated to sink with RW with supervision.  Engaged in bathing and dressing at sit > stand level at sink with use of AE independently.  Pt asking therapist for sequence of LB dressing to increase success, recommending donning socks after pants due to gripper socks interfering with ease of donning pants.  Therapist providing cues for pt to utilize sock aid for donning socks, as pt attempting to don with reacher without success.  Pt with increased work of breathing throughout, but able to take self-selected rest breaks and complete tasks without assistance.  Pt remained seated at sink to complete grooming tasks.  Notified nursing staff of position.  Therapy Documentation Precautions:  Precautions Precautions: Fall Restrictions Weight Bearing Restrictions: No Pain:  Pt with no c/o pain   Therapy/Group: Individual Therapy  Simonne Come 02/29/2020, 9:55 AM

## 2020-02-29 NOTE — Progress Notes (Signed)
ANTICOAGULATION CONSULT NOTE - Follow-Up  Pharmacy Consult for warfarin Indication: atrial fibrillation  Patient Measurements: Height: 5\' 6"  (167.6 cm) Weight: 73.1 kg (161 lb 2.5 oz) IBW/kg (Calculated) : 59.3  Vital Signs: Temp: 98.5 F (36.9 C) (08/30 0352) Temp Source: Oral (08/30 0352) BP: 125/64 (08/30 0352) Pulse Rate: 65 (08/30 0352)  Labs: Recent Labs    02/27/20 0457 02/28/20 0455 02/29/20 0449  HGB  --   --  9.6*  HCT  --   --  30.6*  PLT  --   --  407*  LABPROT 25.3* 26.4* 27.0*  INR 2.4* 2.5* 2.6*  CREATININE  --   --  0.53    Estimated Creatinine Clearance: 53.6 mL/min (by C-G formula based on SCr of 0.53 mg/dL).  Assessment: 86 yo Cortez with a history of permanent atrial fibrillation presents with abdominal pain and coffee-ground emesis. The patient was found to have small bowel ischemia and underwent exploratory laparotomy, lysis of adhesions, small bowel resection and gastrostomy tube placementon 02/08/20. The patient's INR upon admit was supratherapeutic at 3.3 and the patient received KCentra 1625 units once prior to surgery.  She is on warfarin PTA for afib and pharmacy consulted to restart on 8/15.   PTA: In April, was on warfarin 2.5 mg TTSS and 1.25 mg MWF. However, daughter endorses that she has been therapeutic on 1.25 mg daily for the last 6 weeks (dosed by home health).  INR remains therapeutic at 2.6 today, however showing slow steady rise.  Patient is due for lower dose warfarin today.  There are no signs or symptoms of bleeding noted. The patient continues to eat more portion of meals than previous and will continue to monitor this. No new interacting medications were started and no significant drug interactions were identified. The patient can be very sensitive to warfarin dose adjustments.  Goal of Therapy:  INR 2-3 Monitor platelets by anticoagulation protocol: Yes   Plan:  Warfarin 2.5 mg PO daily except 1.25 mg on Mon and Thurs Q MWF  PT/INR and QMon CBC    Monitor drug interactions and oral intake   Thank you for involving pharmacy in this patient's care.  [employer], Pharm.D., BCPS Clinical Pharmacist Clinical phone for 02/29/2020 from 8:30-4:00 is [phone].  **Pharmacist phone directory can be found on [email or web address] listed under [place].  02/29/2020 8:05 AM

## 2020-03-01 ENCOUNTER — Inpatient Hospital Stay (HOSPITAL_COMMUNITY): Payer: Medicare Other | Admitting: Occupational Therapy

## 2020-03-01 ENCOUNTER — Inpatient Hospital Stay (HOSPITAL_COMMUNITY): Payer: Medicare Other

## 2020-03-01 MED ORDER — PRAVASTATIN SODIUM 40 MG PO TABS: 40.0000 mg | ORAL_TABLET | Freq: Every day | ORAL | 0 refills | Status: DC

## 2020-03-01 MED ORDER — VITAMIN D-3 125 MCG (5000 UT) PO TABS: 5000.0000 [IU] | ORAL_TABLET | Freq: Every day | ORAL | 0 refills | Status: DC

## 2020-03-01 MED ORDER — LEVOTHYROXINE SODIUM 50 MCG PO TABS
50.0000 ug | ORAL_TABLET | Freq: Every day | ORAL | 3 refills | Status: DC
Start: 2020-03-01 — End: 2022-04-16

## 2020-03-01 MED ORDER — BUDESONIDE-FORMOTEROL FUMARATE 80-4.5 MCG/ACT IN AERO: 2.0000 | INHALATION_SPRAY | Freq: Two times a day (BID) | RESPIRATORY_TRACT | 3 refills | Status: DC

## 2020-03-01 MED ORDER — METHOCARBAMOL 500 MG PO TABS: 500.0000 mg | ORAL_TABLET | Freq: Four times a day (QID) | ORAL | 0 refills | Status: DC | PRN

## 2020-03-01 MED ORDER — FAMOTIDINE 20 MG PO TABS: 20.0000 mg | ORAL_TABLET | Freq: Every day | ORAL | 0 refills | Status: DC

## 2020-03-01 MED ORDER — FOLIC ACID 400 MCG PO TABS: 1.0000 mg | ORAL_TABLET | ORAL | 0 refills | Status: DC

## 2020-03-01 MED ORDER — DILTIAZEM HCL ER COATED BEADS 240 MG PO CP24: 240.0000 mg | ORAL_CAPSULE | Freq: Every day | ORAL | 1 refills | Status: DC

## 2020-03-01 MED ORDER — FLUOXETINE HCL 10 MG PO TABS: 10.0000 mg | ORAL_TABLET | Freq: Every day | ORAL | 3 refills | Status: DC

## 2020-03-01 MED ORDER — ACETAMINOPHEN 325 MG PO TABS: 650.0000 mg | ORAL_TABLET | Freq: Four times a day (QID) | ORAL | Status: DC | PRN

## 2020-03-01 NOTE — Progress Notes (Signed)
Patient ID: Stephanie Cortez, female   DOB: 04-Sep-1933, 86 y.o.   MRN: [id number] Follow up visit with patient and daughter to confirm readiness for discharge.  Patient reported she feels ready however did have questions about g-tube removal. Discussed usual protocol and instructions given at discharge with specifics. Reported Dr. Adam Phenix had told her earlier about getting a phone number to call and a date to set up an appointment for tube removal. Discussed care of tube for bathing and use of cover or remove dressing during shower and then redress after bath/pat dry.  Daughter asking about dressing/supplies and discussed where she can obtain needed items and follow up with [facility] nurse to assure process for procedure is comfortable by family. Discussed tube care and need to flush daily until removed. Daughter reported she had not been trained on this process. Nurse made aware of need to review procedure with the patient and family and confirmed supplies to room for discharge. Also noted that instructions for follow up appointments will be written on the discharge instructions. Margarito Liner

## 2020-03-01 NOTE — Progress Notes (Signed)
Physical Therapy Discharge Summary  Patient Details  Name: Stephanie Cortez MRN: [id number] Date of Birth: 22-Oct-1933  Patient has met 10 of 10 long term goals due to improved activity tolerance, improved balance, improved postural control, increased strength, decreased pain, improved awareness and improved coordination. Patient to discharge at an ambulatory level Modified Independent. Pt's daughter present for family education training on 8/31 and verbalized confidence with all tasks to ensure safe discharge home. However, pt does not require any physical assist as pt is currently at a Mod I/supervision level overall.   All goals met  Recommendation:  Patient will benefit from ongoing skilled PT services in home health setting to continue to advance safe functional mobility, address ongoing impairments in transfers, generalized strengthening, dynamic standing balance/coordination, ambulation, endurance, and to minimize fall risk.  Equipment: No equipment provided  Reasons for discharge: treatment goals met  Patient/family agrees with progress made and goals achieved: Yes  PT Discharge Precautions/Restrictions Precautions Precautions: Fall Precaution Comments: gastric drainage bag/abdominal incisions, recent hx of R posterior THA WBAT Restrictions Weight Bearing Restrictions: No Cognition Overall Cognitive Status: Within Functional Limits for tasks assessed Arousal/Alertness: Awake/alert Orientation Level: Oriented X4 Memory: Appears intact Awareness: Appears intact Problem Solving: Appears intact Safety/Judgment: Appears intact Sensation Sensation Light Touch: Appears Intact Proprioception: Appears Intact Coordination Gross Motor Movements are Fluid and Coordinated: No Fine Motor Movements are Fluid and Coordinated: Yes Coordination and Movement Description: grossly uncoordinated due to generalized weakness, decreased endurance, and decreased balance; able to compensate well  with RW Finger Nose Finger Test: [facility] bilaterally Heel Shin Test: limited on R due to decreased R hip ROM Motor  Motor Motor: Abnormal postural alignment and control Motor - Skilled Clinical Observations: generalized weakness and decreased balance/postural control; able to compensate well with RW  Mobility Bed Mobility Bed Mobility: Rolling Right;Rolling Left;Supine to Sit;Sit to Supine Rolling Right: Independent with assistive device Rolling Left: Independent with assistive device Supine to Sit: Independent with assistive device Sit to Supine: Independent with assistive device Transfers Transfers: Sit to Stand;Stand to Sit;Stand Pivot Transfers Sit to Stand: Independent with assistive device Stand to Sit: Independent with assistive device Stand Pivot Transfers: Independent with assistive device Stand Pivot Transfer Details (indicate cue type and reason): no cues provided Transfer (Assistive device): Rolling walker Locomotion  Gait Ambulation: Yes Gait Assistance: Independent with assistive device Gait Distance (Feet): 150 Feet Assistive device: Rolling walker Gait Gait: Yes Gait Pattern: Impaired Gait Pattern: Decreased step length - right;Decreased step length - left;Step-to pattern;Trunk flexed;Poor foot clearance - right;Poor foot clearance - left;Narrow base of support;Decreased trunk rotation Gait velocity: decreased Stairs / Additional Locomotion Stairs: Yes Stairs Assistance: Supervision/Verbal cueing Stair Management Technique: Two rails Number of Stairs: 8 Height of Stairs: 6 Ramp: Supervision/Verbal cueing (RW) Product manager Mobility: Yes Wheelchair Assistance: Independent with Camera operator: Both upper extremities Wheelchair Parts Management: Needs assistance Distance: 155f  Trunk/Postural Assessment  Cervical Assessment Cervical Assessment: Exceptions to [facility] head) Thoracic Assessment Thoracic Assessment:  Exceptions to [facility](rounded shoulders) Lumbar Assessment Lumbar Assessment: Exceptions to [facility](posterior pelvic tilt) Postural Control Postural Control: Deficits on evaluation  Balance Balance Balance Assessed: Yes Static Sitting Balance Static Sitting - Balance Support: Feet unsupported;No upper extremity supported Static Sitting - Level of Assistance: 7: Independent Dynamic Sitting Balance Dynamic Sitting - Balance Support: Feet supported;No upper extremity supported Dynamic Sitting - Level of Assistance: 7: Independent Static Standing Balance Static Standing - Balance Support: Bilateral upper extremity supported (RW) Static Standing -  Level of Assistance: 6: Modified independent (Device/Increase time) Dynamic Standing Balance Dynamic Standing - Balance Support: Bilateral upper extremity supported (RW) Dynamic Standing - Level of Assistance: 6: Modified independent (Device/Increase time) Extremity Assessment  RLE Assessment RLE Assessment: Exceptions to [facility] General Strength Comments: grossly generalized to 4/5 LLE Assessment LLE Assessment: Exceptions to [facility] General Strength Comments: grossly generalized to 4/5  Alfonse Alpers PT, DPT  03/01/2020, 7:33 AM

## 2020-03-01 NOTE — Progress Notes (Signed)
Occupational Therapy Discharge Summary  Patient Details  Name: Stephanie Cortez MRN: [id number] Date of Birth: [birth year]   Patient has met 9 of 9 long term goals due to improved activity tolerance, improved balance, postural control, ability to compensate for deficits and improved awareness.  Patient to discharge at overall Modified Independent level with dynamic standing balance and toileting, Supervision for transfers and LB dressing.  Patient's care partner is independent to provide the necessary physical assistance at discharge.  Recommend 24/7 supervision initially, pt's daughter to provide supervision initially upon d/c.  Reasons goals not met: N/A  Recommendation:  Patient will benefit from ongoing skilled OT services in home health setting to continue to advance functional skills in the area of BADL and Reduce care partner burden.  Equipment: No equipment provided. Has all from previous hospitalization  Reasons for discharge: treatment goals met and discharge from hospital  Patient/family agrees with progress made and goals achieved: Yes  OT Discharge Precautions/Restrictions  Precautions Precautions: Fall Precaution Comments: gastric drainage bag/abdominal incisions, recent hx of R posterior THA WBAT Restrictions Weight Bearing Restrictions: No Vital Signs Oxygen Therapy SpO2: 95 % O2 Device: Room Air  ADL ADL Equipment Provided: Reacher, Sock aid, Long-handled sponge Eating: Independent Grooming: Independent Where Assessed-Grooming: Standing at sink Upper Body Bathing: Setup Where Assessed-Upper Body Bathing: Sitting at sink Lower Body Bathing: Setup, Supervision/safety Where Assessed-Lower Body Bathing: Sitting at sink, Standing at sink Upper Body Dressing: Independent Where Assessed-Upper Body Dressing: Sitting at sink Lower Body Dressing: Supervision/safety Where Assessed-Lower Body Dressing: Sitting at sink, Standing at sink Toileting: Modified  independent Where Assessed-Toileting: Glass blower/designer: Distant supervision Armed forces technical officer Method: Ambulating (with RW) Science writer: Bedside commode (over toilet) Vision Baseline Vision/History: Wears glasses Wears Glasses: Reading only Patient Visual Report: No change from baseline Vision Assessment?: No apparent visual deficits Perception  Perception: Within Functional Limits Praxis Praxis: Intact Cognition Overall Cognitive Status: Within Functional Limits for tasks assessed Arousal/Alertness: Awake/alert Orientation Level: Oriented X4 Memory: Appears intact Awareness: Appears intact Problem Solving: Appears intact Safety/Judgment: Appears intact Sensation Sensation Light Touch: Appears Intact Proprioception: Appears Intact Coordination Gross Motor Movements are Fluid and Coordinated: No Fine Motor Movements are Fluid and Coordinated: Yes Coordination and Movement Description: generalized deconditioning and impaired movement due to pain and decreased ROM in Rt hip Motor  Motor Motor: Abnormal postural alignment and control Motor - Skilled Clinical Observations: generalized weakness and decreased balance/postural control; able to compensate well with RW Mobility  Bed Mobility Bed Mobility: Rolling Right;Rolling Left;Supine to Sit;Sit to Supine Rolling Right: Independent with assistive device Rolling Left: Independent with assistive device Supine to Sit: Independent with assistive device Sit to Supine: Independent with assistive device Transfers Sit to Stand: Independent with assistive device Stand to Sit: Independent with assistive device  Trunk/Postural Assessment  Cervical Assessment Cervical Assessment: Exceptions to [facility] (forward head) Thoracic Assessment Thoracic Assessment: Exceptions to [facility] (rounded shoulders) Lumbar Assessment Lumbar Assessment: Exceptions to [facility] (posterior pelvic tilt) Postural Control Postural Control: Deficits on  evaluation  Balance Balance Balance Assessed: Yes Static Sitting Balance Static Sitting - Balance Support: Feet unsupported;No upper extremity supported Static Sitting - Level of Assistance: 7: Independent Dynamic Sitting Balance Dynamic Sitting - Balance Support: Feet supported;No upper extremity supported Dynamic Sitting - Level of Assistance: 7: Independent Static Standing Balance Static Standing - Balance Support: Bilateral upper extremity supported (RW) Static Standing - Level of Assistance: 6: Modified independent (Device/Increase time) Dynamic Standing Balance Dynamic Standing - Balance Support: Bilateral upper extremity  supported (RW) Dynamic Standing - Level of Assistance: 6: Modified independent (Device/Increase time) Extremity/Trunk Assessment RUE Assessment RUE Assessment: Within Functional Limits LUE Assessment LUE Assessment: Within Functional Limits   Olukemi Panchal 03/01/2020, 9:00 AM

## 2020-03-01 NOTE — Progress Notes (Signed)
Inpatient Rehabilitation Care Coordinator  Discharge Note  The overall goal for the admission was met for:   Discharge location: [place] 24 HR SUPERVISION  Length of Stay: Yes-14 DAYS  Discharge activity level: Yes-SUPERVISION LEVEL  Home/community participation: Yes  Services provided included: MD, RD, PT, OT, SLP, RN, CM, Pharmacy and SW  Financial Services: Medicare and Private Insurance: Theme park manager  Follow-up services arranged: Home Health: [place] and Patient/Family request agency HH: NO NEEDS, DME: ACTIVE PT WITH [place]  Comments (or additional information):[name] WAS HERE FOR EDUCATION AWARE PT WILL REQUIRE 24 HR SUPERVISION AT DC. GAVE DAUGHTER PRIVATE DUTY LIST TO PURSUE HIRED ASSIST  Patient/Family verbalized understanding of follow-up arrangements: Yes  Individual responsible for coordination of the follow-up plan: [name]-DAUGHTER [phone]  Confirmed correct DME delivered: Elease Hashimoto 03/01/2020    Rilen Shukla, [place]

## 2020-03-01 NOTE — Progress Notes (Signed)
Physical Therapy Session Note  Patient Details  Name: Stephanie Cortez MRN: [id number] Date of Birth: Aug 09, 1933  Today's Date: 03/01/2020 PT Individual Time: 1638-4665 and 9935-7017 PT Individual Time Calculation (min): 34 min and 39 min  Short Term Goals: Week 1:  PT Short Term Goal 1 (Week 1): Patient will perform bed mobility with supervision using bed rails to simulate home set up. PT Short Term Goal 1 - Progress (Week 1): Met PT Short Term Goal 2 (Week 1): Patient will perform basic transfers with supervision using LRAD. PT Short Term Goal 2 - Progress (Week 1): Progressing toward goal PT Short Term Goal 3 (Week 1): Patient will ambulate >150 ft using LRAD with supervision. PT Short Term Goal 3 - Progress (Week 1): Progressing toward goal Week 2:  PT Short Term Goal 1 (Week 2): STG=LTG due to LOS  Skilled Therapeutic Interventions/Progress Updates:   Treatment Session 1: 1124-1158 34 min Received pt supine in bed, pt agreeable to therapy, and denied any pain during session but reported dull ache with mobility. Session with emphasis on discharge planning, functional mobility/transfers, generalized strengthening, dynamic standing balance/coordination, ambulation, simulated car transfers, and improved activity tolerance. Pt performed bed mobility mod I using bedrails. Pt ambulated 157f x 2 trials with RW mod I to/from ortho gym and performed ambulatory simulated car transfer with RW and supervision. Pt ambulated 174fon uneven surfaces (ramp) with RW and supervision and picked up cone from floor with RW and CGA. Worked on dynamic standing balance without AD and CGA tossing horseshoes x 2 trials. Concluded session with pt sitting in WC, needs within reach, and seatbelt alarm.   Treatment Session 2: 1445-1524 39 min Received pt supine in bed with daughter present for family education training, pt agreeable to therapy, and did not state pain level during session. Session with emphasis on  discharge planning/family education training, functional mobility/transfers, generalized strengthening, dynamic standing balance/coordination, ambulation, stair navigation, simulated car transfers, and improved activity tolerance. Pt performed bed mobility mod I using bedrails and ambulated 10024f 2, and [id number] with RW mod I. Pt navigated 8 steps with 2 rails and supervision with cues for stepping technique "up with the good and down with the bad." Pt performed simulated car transfer with RW and supervision and ambulated 10ft69funeven surfaces (ramp) with RW and supervision. Therapist educated pt's daughter on pt's CLOF and informed pt's daughter that pt is mod I overall but does require supervision for stair navigation for safety. Pt and pt's daughter verbalized/demonstrated confidence with all tasks to ensure safe D/C home. Pt ambulated additional 10ft 48f RW and supervision to toilet and able to manage clothes and void independently. Concluded session with pt sitting on toilet, needs within reach, and pt agreed to pull alarm cord to be assisted back to bed.   Therapy Documentation Precautions:  Precautions Precautions: Fall Restrictions Weight Bearing Restrictions: No  Therapy/Group: Individual Therapy Wendel Homeyer MAlfonse AlpersPT   03/01/2020, 7:25 AM

## 2020-03-01 NOTE — Progress Notes (Signed)
Occupational Therapy Session Note  Patient Details  Name: Stephanie Cortez MRN: [id number] Date of Birth: [birth year]  Today's Date: 03/01/2020 OT Individual Time: 0930-1030 and 1305-1350 OT Individual Time Calculation (min): 60 min and 45 min   Short Term Goals: Week 2:  OT Short Term Goal 1 (Week 2): STG = LTGs due to remaining ELOS  Skilled Therapeutic Interventions/Progress Updates:    1) Treatment session with focus on ADL retraining with bathing/dressing and functional mobility with RW.  Pt received semi-reclined in bed expressing desire to bathe and dress during session.  Pt asking questions about family education session and upcoming d/c.  Pt completed bed mobility Mod I and ambulated to sink with RW at Mod I level.  Pt completed oral care in standing without assistance.  Engaged in bathing and dressing at sit > stand level at sink with good carryover of use of reacher and sock aid during LB dressing.  Pt overall Mod I with bathing and dressing at sit > stand level at sink.  Pt reports having a bench in her tub/shower but is unsure if she will shower until g-tube removed.  Pt ambulated to [facility] over toilet with RW supervision and left upright on toilet to complete BM.  Notified nursing staff of pt position.  2) Treatment session with focus on family education with pt and pt's daughter, Stephanie Cortez.  Pt's daughter asking multiple questions about current functional mobility, routine with self-care, and d/c schedule.  Therapist discussed current routine of bathing at sink secondary to g-tube while discussing possibility of showering if tube covered - pt and dtr to confirm with MD.  Discussed technique to cover g-tube.  Pt completed functional mobility in ADL apt while completing tub/shower transfer with tub bench and toilet transfers with use of 3 in1 over toilet both at distant supervision level.  Discussed initial recommendation for supervision for transfers but pt making quick progress to Mod I with RW.   Returned to room, pt reports need to toilet.  Completed transfer to toilet ambulating with RW distant supervision and pt left seated on toilet with daughter present to provide supervision when exiting toilet.    Therapy Documentation Precautions:  Precautions Precautions: Fall Precaution Comments: gastric drainage bag/abdominal incisions, recent hx of R posterior THA WBAT Restrictions Weight Bearing Restrictions: No General:   Vital Signs: Oxygen Therapy SpO2: 95 % O2 Device: Room Air Pain:  1)Pt with no c/o pain  2) Pt with no c/o pain ADL: ADL Equipment Provided: Reacher, Sock aid, Long-handled sponge Eating: Independent Grooming: Independent Where Assessed-Grooming: Standing at sink Upper Body Bathing: Setup Where Assessed-Upper Body Bathing: Sitting at sink Lower Body Bathing: Setup, Supervision/safety Where Assessed-Lower Body Bathing: Sitting at sink, Standing at sink Upper Body Dressing: Independent Where Assessed-Upper Body Dressing: Sitting at sink Lower Body Dressing: Supervision/safety Where Assessed-Lower Body Dressing: Sitting at sink, Standing at sink Toileting: Modified independent Where Assessed-Toileting: Control and instrumentation engineer Transfer: Distant supervision Toilet Transfer Method: Ambulating (with RW) Science writer: Bedside commode (over toilet)   Therapy/Group: Individual Therapy  Stephanie Cortez 03/01/2020, 8:59 AM

## 2020-03-01 NOTE — Progress Notes (Signed)
[place] PHYSICAL MEDICINE & REHABILITATION PROGRESS NOTE  Subjective/Complaints: Breathing is stable. Plan for outpatient bronchoscopy. Swelling in neck improved. Pain has resolved.  Discussed that PEG will be removed at outpatient surgery appointment Still constipated.   ROS:  Pt denies SOB, abd pain, CP, N/V/C/D, and vision changes   Objective: Vital Signs: Blood pressure 132/65, pulse 87, temperature 98 F (36.7 C), resp. rate 20, height 5\' 6"  (1.676 m), weight 73.1 kg, SpO2 95 %. No results found. Recent Labs    02/29/20 0449  WBC 6.1  HGB 9.6*  HCT 30.6*  PLT 407*   Recent Labs    02/29/20 0449  NA 134*  K 3.8  CL 103  CO2 23  GLUCOSE 99  BUN 10  CREATININE 0.53  CALCIUM 9.3    Physical Exam: BP 132/65 (BP Location: Right Arm)   Pulse 87   Temp 98 F (36.7 C)   Resp 20   Ht 5\' 6"  (1.676 m)   Wt 73.1 kg   LMP  (LMP Unknown)   SpO2 95%   BMI 26.01 kg/m  General: Alert and oriented x 3, No apparent distress HEENT: Head is normocephalic, atraumatic, PERRLA, EOMI, sclera anicteric, oral mucosa pink and moist, dentition intact, ext ear canals clear,  Neck: Supple without JVD or lymphadenopathy Heart: Reg rate and rhythm. No murmurs rubs or gallops Chest: CTA bilaterally without wheezes, rales, or rhonchi; no distress GI: Soft, NT, ND, (+)BS  +PEG.  Skin: Abdomen with Steri-Strips CDI. Also L neck over L SCM has a little swelling/irritation from previous suture Psych: appropriate Musc: No edema in extremities.  No tenderness in extremities. Neuro: Alert Motor: Bilateral upper extremities, left lower extremity: 5/5 proximal distally Right lower extremity: 4+/5 proximally, 5/5 distally, stable    Assessment/Plan: 1. Functional deficits secondary to debility which require 3+ hours per day of interdisciplinary therapy in a comprehensive inpatient rehab setting.  Physiatrist is providing close team supervision and 24 hour management of active medical  problems listed below.  Physiatrist and rehab team continue to assess barriers to discharge/monitor patient progress toward functional and medical goals  Care Tool:  Bathing    Body parts bathed by patient: Right arm, Left arm, Chest, Abdomen, Front perineal area, Buttocks, Right upper leg, Left upper leg, Face, Left lower leg   Body parts bathed by helper: Right lower leg     Bathing assist Assist Level: Supervision/Verbal cueing     Upper Body Dressing/Undressing Upper body dressing   What is the patient wearing?: Pull over shirt, Bra    Upper body assist Assist Level: Independent    Lower Body Dressing/Undressing Lower body dressing      What is the patient wearing?: Underwear/pull up, Pants     Lower body assist Assist for lower body dressing: Supervision/Verbal cueing     Toileting Toileting    Toileting assist Assist for toileting: Supervision/Verbal cueing     Transfers Chair/bed transfer  Transfers assist     Chair/bed transfer assist level: Supervision/Verbal cueing Chair/bed transfer assistive device: Walker, Clinical biochemist   Ambulation assist      Assist level: Supervision/Verbal cueing Assistive device: Walker-rolling Max distance: 15ft   Walk 10 feet activity   Assist     Assist level: Supervision/Verbal cueing Assistive device: Walker-rolling   Walk 50 feet activity   Assist    Assist level: Supervision/Verbal cueing Assistive device: Walker-rolling    Walk 150 feet activity   Assist  Assist level: Supervision/Verbal cueing Assistive device: Walker-rolling    Walk 10 feet on uneven surface  activity   Assist Walk 10 feet on uneven surfaces activity did not occur: Safety/medical concerns (decreased strength/activity tolerance)   Assist level: Contact Guard/Touching assist Assistive device: Aeronautical engineer Will patient use wheelchair at discharge?: Yes Type of  Wheelchair: Manual    Wheelchair assist level: Supervision/Verbal cueing Max wheelchair distance: >150 ft    Wheelchair 50 feet with 2 turns activity    Assist        Assist Level: Supervision/Verbal cueing   Wheelchair 150 feet activity     Assist     Assist Level: Supervision/Verbal cueing     Medical Problem List and Plan: 1.    Debility secondary to small bowel ischemia status post exploratory laparotomy lysis of adhesions small bowel resection gastrostomy tube placement 02/08/2020  Continue CIR  2.  Antithrombotics: -DVT/anticoagulation: Chronic Coumadin for history of atrial fibrillation INR 2.5 on 8/29/ D/c Lovenox.  8/30- INR 2.6- per pharmacy              -antiplatelet therapy: N/A 3. Pain Management: Robaxin as needed  Robaxin to three times per day   Controlled 8/30- still with some thigh pain.    Continue current meds 4. Mood: Prozac 10 mg daily             -antipsychotic agents: N/A 5. Neuropsych: This patient is capable of making decisions on her own behalf. 6. Skin/Wound Care: Routine skin checks 7. Fluids/Electrolytes/Nutrition: Routine in and outs. 8.  Acute blood loss anemia.               Hemoglobin 9.3 on 8/27 9.  Atrial fibrillation.  Continue Cardizem 60 mg every 6 hours.  Chronic Coumadin resumed             Controlled 8/31  Monitor with increased exertion. 10.  Hypothyroidism.  Synthroid 11.  Hypoalbuminemia  Supplement initiated  12.  Hyponatremia  Sodium 134 on 8/23, labs ordered for Monday  8/30- Na still is 134  Continue to monitor  13. Bowel movements:  Constipated- ordered prn Dulcolax and Miralax. Please provide prune juice with meals. 14. S/p PEG on 02/08/20  Not using at present.   8/30- d/c 6 weeks from placement (8/9)    LOS: 13 days A FACE TO FACE EVALUATION WAS PERFORMED  Martha Clan P Aviyon Hocevar 03/01/2020, 8:26 AM

## 2020-03-02 ENCOUNTER — Other Ambulatory Visit: Payer: Self-pay

## 2020-03-02 LAB — PROTIME-INR
INR: 2.8 — ABNORMAL HIGH (ref 0.8–1.2)
Prothrombin Time: 28.5 seconds — ABNORMAL HIGH (ref 11.4–15.2)

## 2020-03-02 MED ORDER — WARFARIN 1.25 MG HALF TABLET
1.2500 mg | ORAL_TABLET | ORAL | Status: DC
  Filled 2020-03-02: qty 1

## 2020-03-02 MED ORDER — WARFARIN SODIUM 2.5 MG PO TABS: ORAL_TABLET | ORAL | 11 refills | Status: DC

## 2020-03-02 MED ORDER — WARFARIN SODIUM 2.5 MG PO TABS: 2.5000 mg | ORAL_TABLET | ORAL | Status: DC

## 2020-03-02 NOTE — Progress Notes (Signed)
[place] PHYSICAL MEDICINE & REHABILITATION PROGRESS NOTE  Subjective/Complaints: SOB is noticeable during therapy sessions- she feels ok at rest This morning when she first woke she felt a little nervous that this is her last day.  Ready for DC today  ROS:  Pt denies SOB, abd pain, CP, N/V/C/D, and vision changes   Objective: Vital Signs: Blood pressure (!) 135/54, pulse 72, temperature 97.9 F (36.6 C), resp. rate 14, height 5\' 6"  (1.676 m), weight 73.1 kg, SpO2 96 %. No results found. Recent Labs    02/29/20 0449  WBC 6.1  HGB 9.6*  HCT 30.6*  PLT 407*   Recent Labs    02/29/20 0449  NA 134*  K 3.8  CL 103  CO2 23  GLUCOSE 99  BUN 10  CREATININE 0.53  CALCIUM 9.3    Physical Exam: BP (!) 135/54 (BP Location: Right Arm)   Pulse 72   Temp 97.9 F (36.6 C)   Resp 14   Ht 5\' 6"  (1.676 m)   Wt 73.1 kg   LMP  (LMP Unknown)   SpO2 96%   BMI 26.01 kg/m  General: Alert and oriented x 3, No apparent distress HEENT: Head is normocephalic, atraumatic, PERRLA, EOMI, sclera anicteric, oral mucosa pink and moist, dentition intact, ext ear canals clear,  Neck: Supple without JVD or lymphadenopathy Heart: Reg rate and rhythm. No murmurs rubs or gallops Chest: CTA bilaterally without wheezes, rales, or rhonchi; no distress GI: Soft, NT, ND, (+)BS  +PEG.  Skin: Abdomen with Steri-Strips CDI. Also L neck over L SCM has a little swelling/irritation from previous suture Psych: appropriate Musc: No edema in extremities.  No tenderness in extremities. Neuro: Alert Motor: Bilateral upper extremities, left lower extremity: 5/5 proximal distally Right lower extremity: 4+/5 proximally, 5/5 distally, stable  Assessment/Plan: 1. Functional deficits secondary to debility which require 3+ hours per day of interdisciplinary therapy in a comprehensive inpatient rehab setting.  Physiatrist is providing close team supervision and 24 hour management of active medical problems listed  below.  Physiatrist and rehab team continue to assess barriers to discharge/monitor patient progress toward functional and medical goals  Care Tool:  Bathing    Body parts bathed by patient: Right arm, Left arm, Chest, Abdomen, Front perineal area, Buttocks, Right upper leg, Left upper leg, Face, Left lower leg, Right lower leg   Body parts bathed by helper: Right lower leg     Bathing assist Assist Level: Supervision/Verbal cueing     Upper Body Dressing/Undressing Upper body dressing   What is the patient wearing?: Pull over shirt, Bra    Upper body assist Assist Level: Independent    Lower Body Dressing/Undressing Lower body dressing      What is the patient wearing?: Underwear/pull up, Pants     Lower body assist Assist for lower body dressing: Independent with assitive device     Toileting Toileting    Toileting assist Assist for toileting: Independent     Transfers Chair/bed transfer  Transfers assist     Chair/bed transfer assist level: Independent with assistive device Chair/bed transfer assistive device: Programmer, multimedia   Ambulation assist      Assist level: Independent with assistive device Assistive device: Walker-rolling Max distance: >128ft   Walk 10 feet activity   Assist     Assist level: Independent with assistive device Assistive device: Walker-rolling   Walk 50 feet activity   Assist    Assist level: Independent with assistive device  Assistive device: Walker-rolling    Walk 150 feet activity   Assist    Assist level: Independent with assistive device Assistive device: Walker-rolling    Walk 10 feet on uneven surface  activity   Assist Walk 10 feet on uneven surfaces activity did not occur: Safety/medical concerns (decreased strength/activity tolerance)   Assist level: Supervision/Verbal cueing Assistive device: Aeronautical engineer Will patient use wheelchair at  discharge?: No Type of Wheelchair: Manual    Wheelchair assist level: Independent Max wheelchair distance: >150 ft    Wheelchair 50 feet with 2 turns activity    Assist        Assist Level: Independent   Wheelchair 150 feet activity     Assist     Assist Level: Independent     Medical Problem List and Plan: 1.    Debility secondary to small bowel ischemia status post exploratory laparotomy lysis of adhesions small bowel resection gastrostomy tube placement 02/08/2020  DC home. Follow-up with Dr. Ranell Patrick in 1-2 weeks for transitional care appointment. Patient lives alone.  2.  Antithrombotics: -DVT/anticoagulation: Chronic Coumadin for history of atrial fibrillation 9/1 INR is 2.8             -antiplatelet therapy: N/A 3. Pain Management: Robaxin as needed  Robaxin to three times per day   Controlled 9/1  Continue current meds 4. Mood: Prozac 10 mg daily             -antipsychotic agents: N/A 5. Neuropsych: This patient is capable of making decisions on her own behalf. 6. Skin/Wound Care: Routine skin checks 7. Fluids/Electrolytes/Nutrition: Routine in and outs. 8.  Acute blood loss anemia.               Hemoglobin 9.3 on 8/27 9.  Atrial fibrillation.  Continue Cardizem 60 mg every 6 hours.  Chronic Coumadin resumed             Controlled 9/1  Monitor with increased exertion. 10.  Hypothyroidism.  Synthroid 11.  Hypoalbuminemia  Supplement initiated  12.  Hyponatremia  Sodium 134 on 8/23, labs ordered for Monday  8/30- Na still is 134  Continue to monitor  13. Bowel movements:  Constipated- ordered prn Dulcolax and Miralax. Please provide prune juice with meals. Messaged the nurse to bring some prune juice for her 14. S/p PEG on 02/08/20  Not using at present.   8/30- d/c 6 weeks from placement (8/9)   >30 minutes spent in discharge of patient including review of medications and follow-up appointments, physical examination, discussion regarding her PEG and  left neck pain, support at home and her relationship with her daughter, constipation, and in answering all patient's questions    LOS: 14 days A FACE TO FACE EVALUATION WAS PERFORMED  Stephanie Cortez 03/02/2020, 8:32 AM

## 2020-03-02 NOTE — Progress Notes (Signed)
ANTICOAGULATION CONSULT NOTE - Follow-Up  Pharmacy Consult for warfarin Indication: atrial fibrillation  Patient Measurements: Height: 5\' 6"  (167.6 cm) Weight: 73.1 kg (161 lb 2.5 oz) IBW/kg (Calculated) : 59.3  Vital Signs: Temp: 97.9 F (36.6 C) (09/01 0426) BP: 135/54 (09/01 0426) Pulse Rate: 72 (09/01 0426)  Labs: Recent Labs    02/29/20 0449 03/02/20 0721  HGB 9.6*  --   HCT 30.6*  --   PLT 407*  --   LABPROT 27.0* 28.5*  INR 2.6* 2.8*  CREATININE 0.53  --     Estimated Creatinine Clearance: 53.6 mL/min (by C-G formula based on SCr of 0.53 mg/dL).  Assessment: 86 yo female with a history of permanent atrial fibrillation presents with abdominal pain and coffee-ground emesis. The patient was found to have small bowel ischemia and underwent exploratory laparotomy, lysis of adhesions, small bowel resection and gastrostomy tube placementon 02/08/20. The patient's INR upon admit was supratherapeutic at 3.3 and the patient received KCentra 1625 units once prior to surgery.  She is on warfarin PTA for afib and pharmacy consulted to restart on 8/15.   PTA: In April, was on warfarin 2.5 mg TTSS and 1.25 mg MWF. However, daughter endorses that she has been therapeutic on 1.25 mg daily for the last 6 weeks (dosed by home health).  INR continue to trend up.  Will adjust regimen to 1.25mg  dose 3 x week to lower weekly total dose.    There are no signs or symptoms of bleeding noted. No new interacting medications were started and no significant drug interactions were identified. The patient can be very sensitive to warfarin dose adjustments.  Goal of Therapy:  INR 2-3 Monitor platelets by anticoagulation protocol: Yes   Plan:  Warfarin 2.5 mg PO daily except 1.25 mg on MWF Q MWF PT/INR and QMon CBC    Monitor drug interactions and oral intake   Thank you for involving pharmacy in this patient's care.  [employer], Pharm.D., BCPS Clinical Pharmacist Clinical phone for  03/02/2020 from 8:30-4:00 is [phone].  **Pharmacist phone directory can be found on [email or web address] listed under [place].  03/02/2020 9:22 AM

## 2020-03-02 NOTE — Progress Notes (Signed)
Pt's daughter here to take pt home. Pt taken to car via w/c by NT. Pt discharged.

## 2020-03-02 NOTE — Discharge Instructions (Signed)
Inpatient Rehab Discharge Instructions  DORENE BRUNI Discharge date and time: No discharge date for patient encounter.   Activities/Precautions/ Functional Status: Activity: activity as tolerated Diet: soft Wound Care: keep wound clean and dry Functional status:  ___ No restrictions     ___ Walk up steps independently ___ 24/7 supervision/assistance   ___ Walk up steps with assistance ___ Intermittent supervision/assistance  ___ Bathe/dress independently ___ Walk with walker     _x__ Bathe/dress with assistance ___ Walk Independently    ___ Shower independently ___ Walk with assistance    ___ Shower with assistance ___ No alcohol     ___ Return to work/school ________  Special Instructions: No driving smoking or alcohol  Home health nurse to check INR on 03/04/2020         results to DR [name]  [phone] fax [place] OF PEG TUBE WITH DR TSEUI [phone]  Routine tube flush     COMMUNITY REFERRALS UPON DISCHARGE:    Home Health:   PT, OT, RN                   [place] Phone:[phone]  Medical Equipment/Items Ordered:HAS ALL EQUIPMENT FROM PAST ADMITS                                                   My questions have been answered and I understand these instructions. I will adhere to these goals and the provided educational materials after my discharge from the hospital.  Patient/Caregiver Signature _______________________________ Date __________  Clinician Signature _______________________________________ Date __________  Please bring this form and your medication list with you to all your follow-up doctor's appointments.

## 2020-03-02 NOTE — Patient Care Conference (Signed)
Inpatient RehabilitationTeam Conference and Plan of Care Update Date: 03/02/2020   Time: 11:37 AM    Patient Name: Stephanie Cortez      Medical Record Number: [id number]  Date of Birth: [birth year] Sex: Female         Room/Bed: 4M01C/4M01C-01 Payor Info: Payor: MEDICARE / Plan: MEDICARE PART A AND B / Product Type: *No Product type* /    Admit Date/Time:  02/17/2020  3:04 PM  Primary Diagnosis:  [facility] Problems: Principal Problem:   Debility Active Problems:   Sepsis ([place])   Subtherapeutic international normalized ratio (INR)   Post-operative pain   S/P percutaneous endoscopic gastrostomy (PEG) tube placement ([place])   Malnutrition of moderate degree    Expected Discharge Date: Expected Discharge Date: 03/02/20  Team Members Present: Physician leading conference: Dr. Leeroy Cha Care Coodinator Present: Dorien Chihuahua, RN, BSN, CRRN;Other (comment) Jacqlyn Larsen [place], SW) Nurse Present: Other (comment) Abigail Miyamoto, RN) PT Present: Becky Sax, PT OT Present: Simonne Come, OT PPS Coordinator present : Ileana Ladd, Burna Mortimer, SLP     Current Status/Progress Goal Weekly Team Focus  Bowel/Bladder             Swallow/Nutrition/ Hydration             ADL's             Mobility             Communication             Safety/Cognition/ Behavioral Observations            Pain             Skin               Discharge Planning:      Team Discussion: On target for discharge Patient on target to meet rehab goals: yes  *See Care Plan and progress notes for long and short-term goals.   Revisions to Treatment Plan:    Teaching Needs: G-tube care/dressing  Current Barriers to Discharge:   Possible Resolutions to Barriers:      Medical Summary               I attest that I was present, lead the team conference, and concur with the assessment and plan of the team.   Dorien Chihuahua B 03/02/2020, 3:09 PM

## 2020-03-03 ENCOUNTER — Other Ambulatory Visit: Payer: Self-pay | Admitting: *Deleted

## 2020-03-03 DIAGNOSIS — J479 Bronchiectasis, uncomplicated: Secondary | ICD-10-CM | POA: Diagnosis not present

## 2020-03-03 DIAGNOSIS — M5186 Other intervertebral disc disorders, lumbar region: Secondary | ICD-10-CM | POA: Diagnosis not present

## 2020-03-03 DIAGNOSIS — H409 Unspecified glaucoma: Secondary | ICD-10-CM | POA: Diagnosis not present

## 2020-03-03 DIAGNOSIS — Z48815 Encounter for surgical aftercare following surgery on the digestive system: Secondary | ICD-10-CM | POA: Diagnosis not present

## 2020-03-03 DIAGNOSIS — G629 Polyneuropathy, unspecified: Secondary | ICD-10-CM | POA: Diagnosis not present

## 2020-03-03 DIAGNOSIS — L309 Dermatitis, unspecified: Secondary | ICD-10-CM | POA: Diagnosis not present

## 2020-03-03 DIAGNOSIS — J449 Chronic obstructive pulmonary disease, unspecified: Secondary | ICD-10-CM | POA: Diagnosis not present

## 2020-03-03 DIAGNOSIS — F419 Anxiety disorder, unspecified: Secondary | ICD-10-CM | POA: Diagnosis not present

## 2020-03-03 DIAGNOSIS — E440 Moderate protein-calorie malnutrition: Secondary | ICD-10-CM | POA: Diagnosis not present

## 2020-03-03 DIAGNOSIS — Z431 Encounter for attention to gastrostomy: Secondary | ICD-10-CM | POA: Diagnosis not present

## 2020-03-03 DIAGNOSIS — K5792 Diverticulitis of intestine, part unspecified, without perforation or abscess without bleeding: Secondary | ICD-10-CM | POA: Diagnosis not present

## 2020-03-03 DIAGNOSIS — K219 Gastro-esophageal reflux disease without esophagitis: Secondary | ICD-10-CM | POA: Diagnosis not present

## 2020-03-03 DIAGNOSIS — I5020 Unspecified systolic (congestive) heart failure: Secondary | ICD-10-CM | POA: Diagnosis not present

## 2020-03-03 DIAGNOSIS — Z8781 Personal history of (healed) traumatic fracture: Secondary | ICD-10-CM | POA: Diagnosis not present

## 2020-03-03 DIAGNOSIS — G8929 Other chronic pain: Secondary | ICD-10-CM | POA: Diagnosis not present

## 2020-03-03 DIAGNOSIS — K220 Achalasia of cardia: Secondary | ICD-10-CM | POA: Diagnosis not present

## 2020-03-03 DIAGNOSIS — I110 Hypertensive heart disease with heart failure: Secondary | ICD-10-CM | POA: Diagnosis not present

## 2020-03-03 DIAGNOSIS — J849 Interstitial pulmonary disease, unspecified: Secondary | ICD-10-CM | POA: Diagnosis not present

## 2020-03-03 DIAGNOSIS — I482 Chronic atrial fibrillation, unspecified: Secondary | ICD-10-CM | POA: Diagnosis not present

## 2020-03-03 DIAGNOSIS — D62 Acute posthemorrhagic anemia: Secondary | ICD-10-CM | POA: Diagnosis not present

## 2020-03-03 DIAGNOSIS — I5032 Chronic diastolic (congestive) heart failure: Secondary | ICD-10-CM | POA: Diagnosis not present

## 2020-03-03 DIAGNOSIS — F329 Major depressive disorder, single episode, unspecified: Secondary | ICD-10-CM | POA: Diagnosis not present

## 2020-03-03 DIAGNOSIS — E785 Hyperlipidemia, unspecified: Secondary | ICD-10-CM | POA: Diagnosis not present

## 2020-03-03 DIAGNOSIS — E039 Hypothyroidism, unspecified: Secondary | ICD-10-CM | POA: Diagnosis not present

## 2020-03-03 DIAGNOSIS — K449 Diaphragmatic hernia without obstruction or gangrene: Secondary | ICD-10-CM | POA: Diagnosis not present

## 2020-03-03 NOTE — Patient Outreach (Signed)
[facility] [facility]) Care Management  03/03/2020  Stephanie Cortez [birth year] [id number]   Subjective: Telephone call to patient's home number, spoke with patient's daughter/ designated party release ( per active FYIs in chart) , Stephanie Cortez), states her mother is currently resting, Stephanie Cortez is currently in the middle of something, requested call back at a later date, and time.         Objective: Per KPN (Knowledge Performance Now, point of care tool) and chart review, patient hospitalized 02/17/2020 - 03/02/2020 for inpatient rehab, debility, sepsis.   Patient hospitalized 02/08/2020 - 02/17/2020 for  small bowel ischemia secondary to bowel adhesions, status post Exploratory laparotomy, lysis of adhesions, small bowel resection, gastrostomy tube placementon 02/08/2020. Patient also has a history of the following: permanent atrial fibrillation on Coumadin, colitis associated with diverticulitis, IBS, achalasia, hiatal hernia, chronic back pain, hypertension, hypothyroidism, GERD, depression, anxiety, right hip fracture status post recent total hip replacement on 11/16/2019, hypothyroidism, Chronic diastolic heart failure, Malnutrition of moderate degree, Interstitial lung disease on home oxygen, and Diverticulitis.        Assessment: Received Medicare [facility] Liaison referral on 03/02/2020.   Referral source: Natividad Brood.   Referral reason:   Please assign to [place] Coordinator for complex care and disease management follow up       calls to daughter Stephanie Cortez, support with 24 hour care needs, list was       given per rehab social worker other care needs, and assess for       further needs.  Screening  follow up pending patient and / or patient's daughter / designated party release contact.       Plan: RNCM will send unsuccessful outreach  letter, [facility] pamphlet, will call patient and / or patient's daughter / designated party release,  for 2nd  telephone outreach attempt within 4 business days, screening follow up, and will proceed with case closure, after 4th  Unsuccessful outreach call.        Stephanie Cortez H. Annia Friendly, BSN, [place] Management [facility] Telephonic CM Phone: [phone] Fax: [phone]

## 2020-03-04 ENCOUNTER — Telehealth: Payer: Self-pay | Admitting: Registered Nurse

## 2020-03-04 ENCOUNTER — Other Ambulatory Visit: Payer: Self-pay | Admitting: *Deleted

## 2020-03-04 DIAGNOSIS — Z431 Encounter for attention to gastrostomy: Secondary | ICD-10-CM | POA: Diagnosis not present

## 2020-03-04 DIAGNOSIS — Z48815 Encounter for surgical aftercare following surgery on the digestive system: Secondary | ICD-10-CM | POA: Diagnosis not present

## 2020-03-04 DIAGNOSIS — D62 Acute posthemorrhagic anemia: Secondary | ICD-10-CM | POA: Diagnosis not present

## 2020-03-04 DIAGNOSIS — E440 Moderate protein-calorie malnutrition: Secondary | ICD-10-CM | POA: Diagnosis not present

## 2020-03-04 DIAGNOSIS — I110 Hypertensive heart disease with heart failure: Secondary | ICD-10-CM | POA: Diagnosis not present

## 2020-03-04 DIAGNOSIS — K5792 Diverticulitis of intestine, part unspecified, without perforation or abscess without bleeding: Secondary | ICD-10-CM | POA: Diagnosis not present

## 2020-03-04 NOTE — Telephone Encounter (Signed)
[name] has questions about medication why she was put on it, mom is light headed appx 2 times constipation is still an issue, they will see internist next week.

## 2020-03-04 NOTE — Patient Outreach (Signed)
[place] [facility]) Care Management  03/04/2020  MYLA MAURIELLO 1934-03-01 [id number]   Subjective: Telephone call to patient's home number, spoke with patient's daughter/ designated party release ( per active FYIs in chart) , [name]), stated patient's name, date of birth, and placed call on speaker with patient.    Patient states she is doing fair.   Daughter states they are familiar with [place] Management services from previous admissions, have all the services they need in place, do not need care management services at this time, and are very appreciative of the follow up.  RNCM provided contact name and number: [phone], advised patient was sent letter with [place] Management information, and RNCM's contact information.   RNCM  advised patient and daughter  to contact RNCM or [facility] Care Management, if services needed in the future.    Objective: Per KPN (Knowledge Performance Now, point of care tool) and chart review, patient hospitalized 02/17/2020 - 03/02/2020 for inpatient rehab, debility, sepsis.   Patient hospitalized 02/08/2020 - 02/17/2020 for  small bowel ischemia secondary to bowel adhesions, status post Exploratory laparotomy, lysis of adhesions, small bowel resection, gastrostomy tube placementon 02/08/2020. Patient also has a history of the following: permanent atrial fibrillation on Coumadin, colitis associated with diverticulitis, IBS, achalasia, hiatal hernia, chronic back pain, hypertension, hypothyroidism, GERD, depression, anxiety, right hip fracture status post recent total hip replacement on 11/16/2019, hypothyroidism, Chronic diastolic heart failure, Malnutrition of moderate degree, Interstitial lung disease on home oxygen, and Diverticulitis.        Assessment: Received [facility] Liaison referral on 03/02/2020.   Referral source: Natividad Brood.   Referral reason:   Please assign to [place] Coordinator for complex  care and disease management follow up       calls to daughter [name], support with 24 hour care needs, list was       given per rehab social worker other care needs, and assess for       further needs.  Screening follow up not completed due to patient / patient's designated party release  refusing services and will proceed with case closure.        Plan: RNCM will send MD case closure letter.  RNCM will close case due to patient declining [facility] Care Management services.        Ashby Moskal H. Annia Friendly, BSN, [employer] Management [facility] Telephonic CM Phone: [phone] Fax: [phone]

## 2020-03-04 NOTE — Telephone Encounter (Signed)
Transitional Care call Transition Questions answered by Daughter Stephanie Cortez  Patient name: Stephanie Cortez DOB: [birth year] 1. Are you/is patient experiencing any problems since coming home? No a. Are there any questions regarding any aspect of care? No 2. Are there any questions regarding medications administration/dosing? No a. Are meds being taken as prescribed? Yes b. "Patient should review meds with caller to confirm" Medication List Reviewed 3. Have there been any falls? No 4. Has Home Health been to the house and/or have they contacted you? Yes, [facility]  a. If not, have you tried to contact them? N/A b. Can we help you contact them? No 5. Are bowels and bladder emptying properly? Daughter reports [name] bowel movements are sluggish, she was instructed to continue bowel program and to F/U with GI, she verbalizes understanding.  a. Are there any unexpected incontinence issues? No b. If applicable, is patient following bowel/bladder programs? See above 6. Any fevers, problems with breathing, unexpected pain? No 7. Are there any skin problems or new areas of breakdown? No 8. Has the patient/family member arranged specialty MD follow up (ie cardiology/neurology/renal/surgical/etc.)?  Ms. Stephanie Cortez will call Dr Kaylyn Layer Dr Jonna Munro her other [place] appointments are scheduled.  a. Can we help arrange? NA 9. Does the patient need any other services or support that we can help arrange? No 10. Are caregivers following through as expected in assisting the patient? Yes 11. Has the patient quit smoking, drinking alcohol, or using drugs as recommended? (                        )  Appointment date/time Telephone Call with Dr Posey Pronto: COVID 19 Risk Factors: 7 On 03/15/2020 at 11:00 with Dr Posey Pronto at  [facility]

## 2020-03-04 NOTE — Telephone Encounter (Addendum)
Return Stephanie Cortez call, She reports Stephanie Cortez Cardizem was discontinued while she was in [employer]. She seen the physical therapist today BP 122/64 Pulse 77 oxygen 96%. Spoke with Stephanie Chew PA, she reviewed hospital chart and Stephanie Cortez was running in the 130'2, instructed Stephanie Cortez to keep blood pressure log and to follow up with her PCP, she verbalizes understanding.

## 2020-03-08 DIAGNOSIS — K5792 Diverticulitis of intestine, part unspecified, without perforation or abscess without bleeding: Secondary | ICD-10-CM | POA: Diagnosis not present

## 2020-03-08 DIAGNOSIS — K219 Gastro-esophageal reflux disease without esophagitis: Secondary | ICD-10-CM | POA: Diagnosis not present

## 2020-03-08 DIAGNOSIS — K56609 Unspecified intestinal obstruction, unspecified as to partial versus complete obstruction: Secondary | ICD-10-CM | POA: Diagnosis not present

## 2020-03-08 DIAGNOSIS — Z8619 Personal history of other infectious and parasitic diseases: Secondary | ICD-10-CM | POA: Diagnosis not present

## 2020-03-08 DIAGNOSIS — Z7901 Long term (current) use of anticoagulants: Secondary | ICD-10-CM | POA: Diagnosis not present

## 2020-03-08 DIAGNOSIS — Z8781 Personal history of (healed) traumatic fracture: Secondary | ICD-10-CM | POA: Diagnosis not present

## 2020-03-08 DIAGNOSIS — Z48815 Encounter for surgical aftercare following surgery on the digestive system: Secondary | ICD-10-CM | POA: Diagnosis not present

## 2020-03-08 DIAGNOSIS — Z09 Encounter for follow-up examination after completed treatment for conditions other than malignant neoplasm: Secondary | ICD-10-CM | POA: Diagnosis not present

## 2020-03-08 DIAGNOSIS — I482 Chronic atrial fibrillation, unspecified: Secondary | ICD-10-CM | POA: Diagnosis not present

## 2020-03-08 DIAGNOSIS — Z431 Encounter for attention to gastrostomy: Secondary | ICD-10-CM | POA: Diagnosis not present

## 2020-03-08 DIAGNOSIS — D62 Acute posthemorrhagic anemia: Secondary | ICD-10-CM | POA: Diagnosis not present

## 2020-03-08 DIAGNOSIS — D500 Iron deficiency anemia secondary to blood loss (chronic): Secondary | ICD-10-CM | POA: Diagnosis not present

## 2020-03-08 DIAGNOSIS — E440 Moderate protein-calorie malnutrition: Secondary | ICD-10-CM | POA: Diagnosis not present

## 2020-03-08 DIAGNOSIS — Z5181 Encounter for therapeutic drug level monitoring: Secondary | ICD-10-CM | POA: Diagnosis not present

## 2020-03-08 DIAGNOSIS — I110 Hypertensive heart disease with heart failure: Secondary | ICD-10-CM | POA: Diagnosis not present

## 2020-03-09 DIAGNOSIS — E440 Moderate protein-calorie malnutrition: Secondary | ICD-10-CM | POA: Diagnosis not present

## 2020-03-09 DIAGNOSIS — D62 Acute posthemorrhagic anemia: Secondary | ICD-10-CM | POA: Diagnosis not present

## 2020-03-09 DIAGNOSIS — K5792 Diverticulitis of intestine, part unspecified, without perforation or abscess without bleeding: Secondary | ICD-10-CM | POA: Diagnosis not present

## 2020-03-09 DIAGNOSIS — Z48815 Encounter for surgical aftercare following surgery on the digestive system: Secondary | ICD-10-CM | POA: Diagnosis not present

## 2020-03-09 DIAGNOSIS — Z431 Encounter for attention to gastrostomy: Secondary | ICD-10-CM | POA: Diagnosis not present

## 2020-03-09 DIAGNOSIS — I110 Hypertensive heart disease with heart failure: Secondary | ICD-10-CM | POA: Diagnosis not present

## 2020-03-10 DIAGNOSIS — E440 Moderate protein-calorie malnutrition: Secondary | ICD-10-CM | POA: Diagnosis not present

## 2020-03-10 DIAGNOSIS — Z48815 Encounter for surgical aftercare following surgery on the digestive system: Secondary | ICD-10-CM | POA: Diagnosis not present

## 2020-03-10 DIAGNOSIS — D62 Acute posthemorrhagic anemia: Secondary | ICD-10-CM | POA: Diagnosis not present

## 2020-03-10 DIAGNOSIS — K5792 Diverticulitis of intestine, part unspecified, without perforation or abscess without bleeding: Secondary | ICD-10-CM | POA: Diagnosis not present

## 2020-03-10 DIAGNOSIS — I110 Hypertensive heart disease with heart failure: Secondary | ICD-10-CM | POA: Diagnosis not present

## 2020-03-10 DIAGNOSIS — Z431 Encounter for attention to gastrostomy: Secondary | ICD-10-CM | POA: Diagnosis not present

## 2020-03-14 DIAGNOSIS — Z431 Encounter for attention to gastrostomy: Secondary | ICD-10-CM | POA: Diagnosis not present

## 2020-03-14 DIAGNOSIS — D62 Acute posthemorrhagic anemia: Secondary | ICD-10-CM | POA: Diagnosis not present

## 2020-03-14 DIAGNOSIS — K5792 Diverticulitis of intestine, part unspecified, without perforation or abscess without bleeding: Secondary | ICD-10-CM | POA: Diagnosis not present

## 2020-03-14 DIAGNOSIS — I110 Hypertensive heart disease with heart failure: Secondary | ICD-10-CM | POA: Diagnosis not present

## 2020-03-14 DIAGNOSIS — Z48815 Encounter for surgical aftercare following surgery on the digestive system: Secondary | ICD-10-CM | POA: Diagnosis not present

## 2020-03-14 DIAGNOSIS — E440 Moderate protein-calorie malnutrition: Secondary | ICD-10-CM | POA: Diagnosis not present

## 2020-03-15 ENCOUNTER — Other Ambulatory Visit: Payer: Self-pay

## 2020-03-15 ENCOUNTER — Encounter: Payer: Medicare Other | Attending: Physical Medicine & Rehabilitation | Admitting: Physical Medicine & Rehabilitation

## 2020-03-15 ENCOUNTER — Encounter: Payer: Self-pay | Admitting: Physical Medicine & Rehabilitation

## 2020-03-15 VITALS — BP 139/88 | Ht 66.0 in | Wt 160.0 lb

## 2020-03-15 DIAGNOSIS — Z931 Gastrostomy status: Secondary | ICD-10-CM | POA: Diagnosis not present

## 2020-03-15 DIAGNOSIS — G8918 Other acute postprocedural pain: Secondary | ICD-10-CM

## 2020-03-15 DIAGNOSIS — K559 Vascular disorder of intestine, unspecified: Secondary | ICD-10-CM

## 2020-03-15 NOTE — Progress Notes (Signed)
Subjective:    Patient ID: Stephanie Cortez, female    DOB: May 20, 1934, 86 y.o.   MRN: [id number]  TELEHEALTH NOTE  Due to national recommendations of social distancing due to COVID 19, an audio/video telehealth visit is felt to be most appropriate for this patient at this time.  See Chart message from today for the patient's consent to telehealth from [facility].     I verified that I am speaking with the correct person using two identifiers.  Location of patient: Home Location of provider: Office Method of communication: Telephone visit Names of participants : Zorita Pang scheduling, [place] Scrape obtaining consent and vitals if available Established patient Time spent on call: 13 minutes  HPI Right-handed female with history of atrial fibrillation maintained on Coumadin, interstitial lung disease maintained on oxygen, anxiety, diastolic congestive heart failure, COPD, hypertension, history of right hip fracture with hemiarthroplasty received inpatient rehab services 10/30/2019 to 11/16/2019, colitis with diverticulitis presents for hospital follow-up after receiving CIR for small bowel ischemia status post exploratory laparotomy lysis of adhesions small bowel resection gastrostomy tube placement 02/08/2020.  Admit date: 02/17/2020 Discharge date: 03/02/2020  Daughter supplements history. At discharge, she was instructed to follow up with surgery, who she sees on the 20th.  She has not followed up with GI. She saw her PCP. She is scheduled to see Dr. Caryl Comes before the end of the year.  Pain is relatively controlled with tylenol. She had labs drawn by PCP, and "good" per patient.  Denies falls. Bowel movements are regular with meds.   Therapies: 2/week Mobility: Walker at all times. DME: Previously owned  Pain Inventory Average Pain 4 Pain Right Now 4 My pain is intermittent, constant, burning, tingling and also feeding tube is not comfortable.   In the  last 24 hours, has pain interfered with the following? General activity 2 Relation with others 0 Enjoyment of life 3 What TIME of day is your pain at its worst? daytime and night Sleep (in general) Fair  Pain is worse with: restless leg at rest.  Pain improves with: rest and therapy/exercise Relief from Meds: Tylenol helps  use a walker how many minutes can you walk? more than 15-20 mins ability to climb steps?  yes do you drive?  no  retired I need assistance with the following:  dressing, bathing, toileting, meal prep, household duties, shopping and Family helping out. Do you have any goals in this area?  yes  No problems in this area spasms  NEW PATIENT  New Patient    Family History  Problem Relation Age of Onset  . Hypertension Mother   . Stroke Mother   . Emphysema Brother   . Other Father        miner's lung  . Cancer Father        Lung  . Colon polyps Sister   . Pancreatic cancer Sister   . Kidney disease Sister   . Atrial fibrillation Other        siblings   Social History   Socioeconomic History  . Marital status: Married    Spouse name: Not on file  . Number of children: 2  . Years of education: Not on file  . Highest education level: Not on file  Occupational History  . Occupation: retired Product manager: [employer]  Tobacco Use  . Smoking status: Never Smoker  . Smokeless tobacco: Never Used  Vaping Use  . Vaping Use: Never  used  Substance and Sexual Activity  . Alcohol use: No  . Drug use: No  . Sexual activity: Not on file    Comment: Hysterectomy  Other Topics Concern  . Not on file  Social History Narrative   2 children 4 grandchildren   Recently moved to [place] from [place]   Retired Pharmacist, hospital   Grew up in [place]. Moved to [place]. Moved to [place] Feb 2010 due to duaghther living in [place]. Son lives in [place], [place]. Husband works as Scientist, physiological in [place].   Social Determinants of Health    Financial Resource Strain:   . Difficulty of Paying Living Expenses: Not on file  Food Insecurity:   . Worried About Charity fundraiser in the Last Year: Not on file  . Ran Out of Food in the Last Year: Not on file  Transportation Needs:   . Lack of Transportation (Medical): Not on file  . Lack of Transportation (Non-Medical): Not on file  Physical Activity:   . Days of Exercise per Week: Not on file  . Minutes of Exercise per Session: Not on file  Stress:   . Feeling of Stress : Not on file  Social Connections:   . Frequency of Communication with Friends and Family: Not on file  . Frequency of Social Gatherings with Friends and Family: Not on file  . Attends Religious Services: Not on file  . Active Member of Clubs or Organizations: Not on file  . Attends Archivist Meetings: Not on file  . Marital Status: Not on file   Past Surgical History:  Procedure Laterality Date  . BOWEL RESECTION N/A 02/08/2020   Procedure: SMALL BOWEL RESECTION;  Surgeon: Donnie Mesa, MD;  Location: [facility];  Service: General;  Laterality: N/A;  . BREAST SURGERY     br bx  . CARDIAC CATHETERIZATION  07/01/2013  . CATARACT EXTRACTION     both  . COLONOSCOPY    . ESOPHAGOSCOPY W/ BOTOX INJECTION  12/11/2011   Procedure: ESOPHAGOSCOPY WITH BOTOX INJECTION;  Surgeon: Rozetta Nunnery, MD;  Location: [facility];  Service: ENT;  Laterality: N/A;  esophogoscopy with dilation, botox injection  . FOOT SURGERY  03/14/2011   gastroc slide-rt  . GASTROSTOMY N/A 02/08/2020   Procedure: INSERTION OF GASTROSTOMY TUBE;  Surgeon: Donnie Mesa, MD;  Location: [facility];  Service: General;  Laterality: N/A;  . HIP ARTHROPLASTY Right 10/28/2019   Procedure: ARTHROPLASTY BIPOLAR HIP (HEMIARTHROPLASTY);  Surgeon: Marybelle Killings, MD;  Location: [facility];  Service: Orthopedics;  Laterality: Right;  . LAPAROTOMY N/A 02/08/2020   Procedure: EXPLORATORY LAPAROTOMY;  Surgeon: Donnie Mesa, MD;  Location:  [facility];  Service: General;  Laterality: N/A;  . LYSIS OF ADHESION N/A 02/08/2020   Procedure: LYSIS OF ADHESIONS;  Surgeon: Donnie Mesa, MD;  Location: [facility];  Service: General;  Laterality: N/A;  . TOTAL ABDOMINAL HYSTERECTOMY    . TOTAL HIP REVISION Right 11/16/2019   Procedure: TOTAL HIP REVISION BIPOLAR TO CEMENTED BIPOLAR;  Surgeon: Marybelle Killings, MD;  Location: [facility];  Service: Orthopedics;  Laterality: Right;  . WISDOM TOOTH EXTRACTION     Past Medical History:  Diagnosis Date  . Anemia    - Hgb 9.7gm% on 07/13/2008 in [place] -  Hgg 129gm% wiht normal irone levsl and ferritin 10/27/2008 in [facility] Recurrent otitis/sinusitis  . Anxiety    chronic BZ prn  . Atrial fibrillation (HCC)    chronic anticoag  .  Bronchiectasis    >PFT 07/13/2008 in [facility] 1.9L/76%, FVC 2.45L/74, Ratio 79, TLC 121%, DLCO 64%  AE BRonchiectasis - Dec 2010.New Rx:  outpatient - Feb 2011 - Rx outpatient  . CHF (congestive heart failure) ([facility])   . COPD (chronic obstructive pulmonary disease) (HCC)    bronchiectasis  . Cricopharyngeal achalasia   . Depression   . Diverticulosis   . Dyslipidemia   . Eczema   . GERD with stricture   . Glaucoma   . H. pylori infection   . Hypertension   . Hyponatremia    chronic, s/p endo eval 06/2012  . Hypothyroid   . IBS (irritable bowel syndrome)   . Lumbar disc disease   . Neuropathy of both feet   . Segmental colitis ([facility])   . Vertigo   . Wears glasses    Ht 5\' 6"  (1.676 m)   Wt 160 lb (72.6 kg) Comment: Per pt tele visit  LMP  (LMP Unknown)   BMI 25.82 kg/m   Opioid Risk Score:   Fall Risk Score:  `1  Depression screen PHQ 2/9  Depression screen PHQ 2/9 03/15/2020  Decreased Interest 0  Down, Depressed, Hopeless 0  PHQ - 2 Score 0  Altered sleeping 0  Tired, decreased energy 0  Change in appetite 0  Feeling bad or failure about yourself  0  Trouble concentrating 0  Moving slowly or fidgety/restless 0  Suicidal thoughts 0  PHQ-9 Score 0  Some  recent data might be hidden   Review of Systems  Constitutional: Negative.   HENT: Negative.   Eyes: Negative.   Respiratory: Negative.   Cardiovascular: Negative.   Gastrointestinal:       Feeding tub discomfort in the stomach  Endocrine: Negative.   Genitourinary: Negative.   Musculoskeletal:       Rest leg syndrome  Skin: Negative.   Allergic/Immunologic: Negative.   Neurological: Negative.   Hematological: Bruises/bleeds easily.  Psychiatric/Behavioral: Negative.        Objective:   Physical Exam  Constitutional: No distress. Respiratory: Normal effort.   Psych: Normal mood.  Normal behavior. Neuro: Alert    Assessment & Plan:  Right-handed female with history of atrial fibrillation maintained on Coumadin, interstitial lung disease maintained on oxygen, anxiety, diastolic congestive heart failure, COPD, hypertension, history of right hip fracture with hemiarthroplasty received inpatient rehab services 10/30/2019 to 11/16/2019, colitis with diverticulitis presents for hospital follow-up after receiving CIR for small bowel ischemia status post exploratory laparotomy lysis of adhesions small bowel resection gastrostomy tube placement 02/08/2020.  1.    Debility secondary to small bowel ischemia status post exploratory laparotomy lysis of adhesions small bowel resection gastrostomy tube placement 02/08/2020  Continue therapies  Follow up with surgery for G-tube removal  2. Pain Management:   Continue prn tylenol   3.  Atrial fibrillation.    Continue meds  Follow up with Cards- needs appointment  4.  Constipation  Controlled with meds  5.  Gait abnormality  Continue therapies  Continue walker for safety  Meds reviewed Referrals reviewed All questions answered

## 2020-03-16 DIAGNOSIS — I110 Hypertensive heart disease with heart failure: Secondary | ICD-10-CM | POA: Diagnosis not present

## 2020-03-16 DIAGNOSIS — E440 Moderate protein-calorie malnutrition: Secondary | ICD-10-CM | POA: Diagnosis not present

## 2020-03-16 DIAGNOSIS — D62 Acute posthemorrhagic anemia: Secondary | ICD-10-CM | POA: Diagnosis not present

## 2020-03-16 DIAGNOSIS — Z431 Encounter for attention to gastrostomy: Secondary | ICD-10-CM | POA: Diagnosis not present

## 2020-03-16 DIAGNOSIS — K5792 Diverticulitis of intestine, part unspecified, without perforation or abscess without bleeding: Secondary | ICD-10-CM | POA: Diagnosis not present

## 2020-03-16 DIAGNOSIS — Z48815 Encounter for surgical aftercare following surgery on the digestive system: Secondary | ICD-10-CM | POA: Diagnosis not present

## 2020-03-18 DIAGNOSIS — E440 Moderate protein-calorie malnutrition: Secondary | ICD-10-CM | POA: Diagnosis not present

## 2020-03-18 DIAGNOSIS — K5792 Diverticulitis of intestine, part unspecified, without perforation or abscess without bleeding: Secondary | ICD-10-CM | POA: Diagnosis not present

## 2020-03-18 DIAGNOSIS — Z48815 Encounter for surgical aftercare following surgery on the digestive system: Secondary | ICD-10-CM | POA: Diagnosis not present

## 2020-03-18 DIAGNOSIS — Z431 Encounter for attention to gastrostomy: Secondary | ICD-10-CM | POA: Diagnosis not present

## 2020-03-18 DIAGNOSIS — D62 Acute posthemorrhagic anemia: Secondary | ICD-10-CM | POA: Diagnosis not present

## 2020-03-18 DIAGNOSIS — I110 Hypertensive heart disease with heart failure: Secondary | ICD-10-CM | POA: Diagnosis not present

## 2020-03-22 DIAGNOSIS — I110 Hypertensive heart disease with heart failure: Secondary | ICD-10-CM | POA: Diagnosis not present

## 2020-03-22 DIAGNOSIS — D62 Acute posthemorrhagic anemia: Secondary | ICD-10-CM | POA: Diagnosis not present

## 2020-03-22 DIAGNOSIS — K5792 Diverticulitis of intestine, part unspecified, without perforation or abscess without bleeding: Secondary | ICD-10-CM | POA: Diagnosis not present

## 2020-03-22 DIAGNOSIS — E440 Moderate protein-calorie malnutrition: Secondary | ICD-10-CM | POA: Diagnosis not present

## 2020-03-22 DIAGNOSIS — Z431 Encounter for attention to gastrostomy: Secondary | ICD-10-CM | POA: Diagnosis not present

## 2020-03-22 DIAGNOSIS — Z48815 Encounter for surgical aftercare following surgery on the digestive system: Secondary | ICD-10-CM | POA: Diagnosis not present

## 2020-03-24 DIAGNOSIS — E440 Moderate protein-calorie malnutrition: Secondary | ICD-10-CM | POA: Diagnosis not present

## 2020-03-24 DIAGNOSIS — I110 Hypertensive heart disease with heart failure: Secondary | ICD-10-CM | POA: Diagnosis not present

## 2020-03-24 DIAGNOSIS — Z48815 Encounter for surgical aftercare following surgery on the digestive system: Secondary | ICD-10-CM | POA: Diagnosis not present

## 2020-03-24 DIAGNOSIS — Z431 Encounter for attention to gastrostomy: Secondary | ICD-10-CM | POA: Diagnosis not present

## 2020-03-24 DIAGNOSIS — K5792 Diverticulitis of intestine, part unspecified, without perforation or abscess without bleeding: Secondary | ICD-10-CM | POA: Diagnosis not present

## 2020-03-24 DIAGNOSIS — D62 Acute posthemorrhagic anemia: Secondary | ICD-10-CM | POA: Diagnosis not present

## 2020-03-28 DIAGNOSIS — K5792 Diverticulitis of intestine, part unspecified, without perforation or abscess without bleeding: Secondary | ICD-10-CM | POA: Diagnosis not present

## 2020-03-28 DIAGNOSIS — Z48815 Encounter for surgical aftercare following surgery on the digestive system: Secondary | ICD-10-CM | POA: Diagnosis not present

## 2020-03-28 DIAGNOSIS — D62 Acute posthemorrhagic anemia: Secondary | ICD-10-CM | POA: Diagnosis not present

## 2020-03-28 DIAGNOSIS — I110 Hypertensive heart disease with heart failure: Secondary | ICD-10-CM | POA: Diagnosis not present

## 2020-03-28 DIAGNOSIS — Z431 Encounter for attention to gastrostomy: Secondary | ICD-10-CM | POA: Diagnosis not present

## 2020-03-28 DIAGNOSIS — E440 Moderate protein-calorie malnutrition: Secondary | ICD-10-CM | POA: Diagnosis not present

## 2020-03-29 DIAGNOSIS — E440 Moderate protein-calorie malnutrition: Secondary | ICD-10-CM | POA: Diagnosis not present

## 2020-03-29 DIAGNOSIS — I110 Hypertensive heart disease with heart failure: Secondary | ICD-10-CM | POA: Diagnosis not present

## 2020-03-29 DIAGNOSIS — Z48815 Encounter for surgical aftercare following surgery on the digestive system: Secondary | ICD-10-CM | POA: Diagnosis not present

## 2020-03-29 DIAGNOSIS — D62 Acute posthemorrhagic anemia: Secondary | ICD-10-CM | POA: Diagnosis not present

## 2020-03-29 DIAGNOSIS — Z431 Encounter for attention to gastrostomy: Secondary | ICD-10-CM | POA: Diagnosis not present

## 2020-03-29 DIAGNOSIS — K5792 Diverticulitis of intestine, part unspecified, without perforation or abscess without bleeding: Secondary | ICD-10-CM | POA: Diagnosis not present

## 2020-03-31 DIAGNOSIS — Z431 Encounter for attention to gastrostomy: Secondary | ICD-10-CM | POA: Diagnosis not present

## 2020-03-31 DIAGNOSIS — Z48815 Encounter for surgical aftercare following surgery on the digestive system: Secondary | ICD-10-CM | POA: Diagnosis not present

## 2020-03-31 DIAGNOSIS — I110 Hypertensive heart disease with heart failure: Secondary | ICD-10-CM | POA: Diagnosis not present

## 2020-03-31 DIAGNOSIS — E440 Moderate protein-calorie malnutrition: Secondary | ICD-10-CM | POA: Diagnosis not present

## 2020-03-31 DIAGNOSIS — K5792 Diverticulitis of intestine, part unspecified, without perforation or abscess without bleeding: Secondary | ICD-10-CM | POA: Diagnosis not present

## 2020-03-31 DIAGNOSIS — D62 Acute posthemorrhagic anemia: Secondary | ICD-10-CM | POA: Diagnosis not present

## 2020-04-02 DIAGNOSIS — L309 Dermatitis, unspecified: Secondary | ICD-10-CM | POA: Diagnosis not present

## 2020-04-02 DIAGNOSIS — D62 Acute posthemorrhagic anemia: Secondary | ICD-10-CM | POA: Diagnosis not present

## 2020-04-02 DIAGNOSIS — E440 Moderate protein-calorie malnutrition: Secondary | ICD-10-CM | POA: Diagnosis not present

## 2020-04-02 DIAGNOSIS — F329 Major depressive disorder, single episode, unspecified: Secondary | ICD-10-CM | POA: Diagnosis not present

## 2020-04-02 DIAGNOSIS — E785 Hyperlipidemia, unspecified: Secondary | ICD-10-CM | POA: Diagnosis not present

## 2020-04-02 DIAGNOSIS — K5792 Diverticulitis of intestine, part unspecified, without perforation or abscess without bleeding: Secondary | ICD-10-CM | POA: Diagnosis not present

## 2020-04-02 DIAGNOSIS — H409 Unspecified glaucoma: Secondary | ICD-10-CM | POA: Diagnosis not present

## 2020-04-02 DIAGNOSIS — J849 Interstitial pulmonary disease, unspecified: Secondary | ICD-10-CM | POA: Diagnosis not present

## 2020-04-02 DIAGNOSIS — Z431 Encounter for attention to gastrostomy: Secondary | ICD-10-CM | POA: Diagnosis not present

## 2020-04-02 DIAGNOSIS — I110 Hypertensive heart disease with heart failure: Secondary | ICD-10-CM | POA: Diagnosis not present

## 2020-04-02 DIAGNOSIS — K219 Gastro-esophageal reflux disease without esophagitis: Secondary | ICD-10-CM | POA: Diagnosis not present

## 2020-04-02 DIAGNOSIS — K220 Achalasia of cardia: Secondary | ICD-10-CM | POA: Diagnosis not present

## 2020-04-02 DIAGNOSIS — J479 Bronchiectasis, uncomplicated: Secondary | ICD-10-CM | POA: Diagnosis not present

## 2020-04-02 DIAGNOSIS — M5186 Other intervertebral disc disorders, lumbar region: Secondary | ICD-10-CM | POA: Diagnosis not present

## 2020-04-02 DIAGNOSIS — G629 Polyneuropathy, unspecified: Secondary | ICD-10-CM | POA: Diagnosis not present

## 2020-04-02 DIAGNOSIS — E039 Hypothyroidism, unspecified: Secondary | ICD-10-CM | POA: Diagnosis not present

## 2020-04-02 DIAGNOSIS — F419 Anxiety disorder, unspecified: Secondary | ICD-10-CM | POA: Diagnosis not present

## 2020-04-02 DIAGNOSIS — Z48815 Encounter for surgical aftercare following surgery on the digestive system: Secondary | ICD-10-CM | POA: Diagnosis not present

## 2020-04-02 DIAGNOSIS — I482 Chronic atrial fibrillation, unspecified: Secondary | ICD-10-CM | POA: Diagnosis not present

## 2020-04-02 DIAGNOSIS — G8929 Other chronic pain: Secondary | ICD-10-CM | POA: Diagnosis not present

## 2020-04-02 DIAGNOSIS — K449 Diaphragmatic hernia without obstruction or gangrene: Secondary | ICD-10-CM | POA: Diagnosis not present

## 2020-04-02 DIAGNOSIS — I5020 Unspecified systolic (congestive) heart failure: Secondary | ICD-10-CM | POA: Diagnosis not present

## 2020-04-02 DIAGNOSIS — Z8781 Personal history of (healed) traumatic fracture: Secondary | ICD-10-CM | POA: Diagnosis not present

## 2020-04-02 DIAGNOSIS — I5032 Chronic diastolic (congestive) heart failure: Secondary | ICD-10-CM | POA: Diagnosis not present

## 2020-04-02 DIAGNOSIS — J449 Chronic obstructive pulmonary disease, unspecified: Secondary | ICD-10-CM | POA: Diagnosis not present

## 2020-04-04 DIAGNOSIS — Z431 Encounter for attention to gastrostomy: Secondary | ICD-10-CM | POA: Diagnosis not present

## 2020-04-04 DIAGNOSIS — D62 Acute posthemorrhagic anemia: Secondary | ICD-10-CM | POA: Diagnosis not present

## 2020-04-04 DIAGNOSIS — K5792 Diverticulitis of intestine, part unspecified, without perforation or abscess without bleeding: Secondary | ICD-10-CM | POA: Diagnosis not present

## 2020-04-04 DIAGNOSIS — E440 Moderate protein-calorie malnutrition: Secondary | ICD-10-CM | POA: Diagnosis not present

## 2020-04-04 DIAGNOSIS — I110 Hypertensive heart disease with heart failure: Secondary | ICD-10-CM | POA: Diagnosis not present

## 2020-04-04 DIAGNOSIS — Z48815 Encounter for surgical aftercare following surgery on the digestive system: Secondary | ICD-10-CM | POA: Diagnosis not present

## 2020-04-05 DIAGNOSIS — E440 Moderate protein-calorie malnutrition: Secondary | ICD-10-CM | POA: Diagnosis not present

## 2020-04-05 DIAGNOSIS — I110 Hypertensive heart disease with heart failure: Secondary | ICD-10-CM | POA: Diagnosis not present

## 2020-04-05 DIAGNOSIS — K5792 Diverticulitis of intestine, part unspecified, without perforation or abscess without bleeding: Secondary | ICD-10-CM | POA: Diagnosis not present

## 2020-04-05 DIAGNOSIS — D62 Acute posthemorrhagic anemia: Secondary | ICD-10-CM | POA: Diagnosis not present

## 2020-04-05 DIAGNOSIS — Z48815 Encounter for surgical aftercare following surgery on the digestive system: Secondary | ICD-10-CM | POA: Diagnosis not present

## 2020-04-05 DIAGNOSIS — Z431 Encounter for attention to gastrostomy: Secondary | ICD-10-CM | POA: Diagnosis not present

## 2020-04-07 ENCOUNTER — Ambulatory Visit (INDEPENDENT_AMBULATORY_CARE_PROVIDER_SITE_OTHER): Payer: Medicare Other | Admitting: Podiatry

## 2020-04-07 ENCOUNTER — Ambulatory Visit (INDEPENDENT_AMBULATORY_CARE_PROVIDER_SITE_OTHER): Payer: Medicare Other

## 2020-04-07 ENCOUNTER — Other Ambulatory Visit: Payer: Self-pay

## 2020-04-07 ENCOUNTER — Encounter: Payer: Self-pay | Admitting: Podiatry

## 2020-04-07 DIAGNOSIS — I2510 Atherosclerotic heart disease of native coronary artery without angina pectoris: Secondary | ICD-10-CM

## 2020-04-07 DIAGNOSIS — B351 Tinea unguium: Secondary | ICD-10-CM

## 2020-04-07 DIAGNOSIS — R52 Pain, unspecified: Secondary | ICD-10-CM

## 2020-04-07 DIAGNOSIS — G629 Polyneuropathy, unspecified: Secondary | ICD-10-CM | POA: Diagnosis not present

## 2020-04-07 DIAGNOSIS — M778 Other enthesopathies, not elsewhere classified: Secondary | ICD-10-CM | POA: Diagnosis not present

## 2020-04-07 DIAGNOSIS — M79675 Pain in left toe(s): Secondary | ICD-10-CM

## 2020-04-07 DIAGNOSIS — M79674 Pain in right toe(s): Secondary | ICD-10-CM

## 2020-04-09 NOTE — Progress Notes (Signed)
Subjective:   Patient ID: Stephanie Cortez, female   DOB: 86 y.o.   MRN: [id number]   HPI Patient presents stating that she has just generalized pain in both feet and that they are looking for any type of help that they can get.  Patient presents with caregiver currently and states the whole feet hurt but the top seem worse and patient is in poor health   ROS      Objective:  Physical Exam  Vascular status diminished both sharp dull vibratory also diminished and pulses PT DP pulses diminished.  Patient is noted to have discomfort dorsum of both feet but overall foot pain and also has multiple signs of neuropathic-like condition     Assessment:  Very difficult problem with possible extensor tendinitis along with neuropathic-like symptomatology     Plan:  H&P x-rays reviewed conditions discussed with family at great length.  At this time I did go ahead and I did a sterile prep and injected the extensor complex bilateral 3 mg Kenalog 5 mg Xylocaine and advised this patient on topical medicines diclofenac and also we discussed oral medicines gabapentin and Lyrica but she has not tolerated these well in the past.  Patient will be seen back as needed  X-rays indicated arthritis of the midtarsal joint bilateral moderate osteoporosis no other pathology noted

## 2020-04-11 DIAGNOSIS — Z431 Encounter for attention to gastrostomy: Secondary | ICD-10-CM | POA: Diagnosis not present

## 2020-04-11 DIAGNOSIS — D62 Acute posthemorrhagic anemia: Secondary | ICD-10-CM | POA: Diagnosis not present

## 2020-04-11 DIAGNOSIS — E440 Moderate protein-calorie malnutrition: Secondary | ICD-10-CM | POA: Diagnosis not present

## 2020-04-11 DIAGNOSIS — Z48815 Encounter for surgical aftercare following surgery on the digestive system: Secondary | ICD-10-CM | POA: Diagnosis not present

## 2020-04-11 DIAGNOSIS — K5792 Diverticulitis of intestine, part unspecified, without perforation or abscess without bleeding: Secondary | ICD-10-CM | POA: Diagnosis not present

## 2020-04-11 DIAGNOSIS — I110 Hypertensive heart disease with heart failure: Secondary | ICD-10-CM | POA: Diagnosis not present

## 2020-04-15 DIAGNOSIS — D62 Acute posthemorrhagic anemia: Secondary | ICD-10-CM | POA: Diagnosis not present

## 2020-04-15 DIAGNOSIS — Z431 Encounter for attention to gastrostomy: Secondary | ICD-10-CM | POA: Diagnosis not present

## 2020-04-15 DIAGNOSIS — E440 Moderate protein-calorie malnutrition: Secondary | ICD-10-CM | POA: Diagnosis not present

## 2020-04-15 DIAGNOSIS — I110 Hypertensive heart disease with heart failure: Secondary | ICD-10-CM | POA: Diagnosis not present

## 2020-04-15 DIAGNOSIS — Z48815 Encounter for surgical aftercare following surgery on the digestive system: Secondary | ICD-10-CM | POA: Diagnosis not present

## 2020-04-15 DIAGNOSIS — K5792 Diverticulitis of intestine, part unspecified, without perforation or abscess without bleeding: Secondary | ICD-10-CM | POA: Diagnosis not present

## 2020-04-18 DIAGNOSIS — Z431 Encounter for attention to gastrostomy: Secondary | ICD-10-CM | POA: Diagnosis not present

## 2020-04-18 DIAGNOSIS — E440 Moderate protein-calorie malnutrition: Secondary | ICD-10-CM | POA: Diagnosis not present

## 2020-04-18 DIAGNOSIS — D62 Acute posthemorrhagic anemia: Secondary | ICD-10-CM | POA: Diagnosis not present

## 2020-04-18 DIAGNOSIS — I110 Hypertensive heart disease with heart failure: Secondary | ICD-10-CM | POA: Diagnosis not present

## 2020-04-18 DIAGNOSIS — K5792 Diverticulitis of intestine, part unspecified, without perforation or abscess without bleeding: Secondary | ICD-10-CM | POA: Diagnosis not present

## 2020-04-18 DIAGNOSIS — Z48815 Encounter for surgical aftercare following surgery on the digestive system: Secondary | ICD-10-CM | POA: Diagnosis not present

## 2020-04-19 DIAGNOSIS — E440 Moderate protein-calorie malnutrition: Secondary | ICD-10-CM | POA: Diagnosis not present

## 2020-04-19 DIAGNOSIS — Z48815 Encounter for surgical aftercare following surgery on the digestive system: Secondary | ICD-10-CM | POA: Diagnosis not present

## 2020-04-19 DIAGNOSIS — K5792 Diverticulitis of intestine, part unspecified, without perforation or abscess without bleeding: Secondary | ICD-10-CM | POA: Diagnosis not present

## 2020-04-19 DIAGNOSIS — Z431 Encounter for attention to gastrostomy: Secondary | ICD-10-CM | POA: Diagnosis not present

## 2020-04-19 DIAGNOSIS — I110 Hypertensive heart disease with heart failure: Secondary | ICD-10-CM | POA: Diagnosis not present

## 2020-04-19 DIAGNOSIS — D62 Acute posthemorrhagic anemia: Secondary | ICD-10-CM | POA: Diagnosis not present

## 2020-04-21 DIAGNOSIS — Z96641 Presence of right artificial hip joint: Secondary | ICD-10-CM | POA: Diagnosis not present

## 2020-04-21 DIAGNOSIS — Z471 Aftercare following joint replacement surgery: Secondary | ICD-10-CM | POA: Diagnosis not present

## 2020-04-21 DIAGNOSIS — M1711 Unilateral primary osteoarthritis, right knee: Secondary | ICD-10-CM | POA: Diagnosis not present

## 2020-04-25 DIAGNOSIS — I110 Hypertensive heart disease with heart failure: Secondary | ICD-10-CM | POA: Diagnosis not present

## 2020-04-25 DIAGNOSIS — Z431 Encounter for attention to gastrostomy: Secondary | ICD-10-CM | POA: Diagnosis not present

## 2020-04-25 DIAGNOSIS — K5792 Diverticulitis of intestine, part unspecified, without perforation or abscess without bleeding: Secondary | ICD-10-CM | POA: Diagnosis not present

## 2020-04-25 DIAGNOSIS — D62 Acute posthemorrhagic anemia: Secondary | ICD-10-CM | POA: Diagnosis not present

## 2020-04-25 DIAGNOSIS — E440 Moderate protein-calorie malnutrition: Secondary | ICD-10-CM | POA: Diagnosis not present

## 2020-04-25 DIAGNOSIS — Z48815 Encounter for surgical aftercare following surgery on the digestive system: Secondary | ICD-10-CM | POA: Diagnosis not present

## 2020-04-26 ENCOUNTER — Encounter: Payer: Medicare Other | Admitting: Physical Medicine & Rehabilitation

## 2020-04-28 DIAGNOSIS — D62 Acute posthemorrhagic anemia: Secondary | ICD-10-CM | POA: Diagnosis not present

## 2020-04-28 DIAGNOSIS — I110 Hypertensive heart disease with heart failure: Secondary | ICD-10-CM | POA: Diagnosis not present

## 2020-04-28 DIAGNOSIS — Z431 Encounter for attention to gastrostomy: Secondary | ICD-10-CM | POA: Diagnosis not present

## 2020-04-28 DIAGNOSIS — K5792 Diverticulitis of intestine, part unspecified, without perforation or abscess without bleeding: Secondary | ICD-10-CM | POA: Diagnosis not present

## 2020-04-28 DIAGNOSIS — E440 Moderate protein-calorie malnutrition: Secondary | ICD-10-CM | POA: Diagnosis not present

## 2020-04-28 DIAGNOSIS — Z48815 Encounter for surgical aftercare following surgery on the digestive system: Secondary | ICD-10-CM | POA: Diagnosis not present

## 2020-04-29 DIAGNOSIS — E440 Moderate protein-calorie malnutrition: Secondary | ICD-10-CM | POA: Diagnosis not present

## 2020-04-29 DIAGNOSIS — I110 Hypertensive heart disease with heart failure: Secondary | ICD-10-CM | POA: Diagnosis not present

## 2020-04-29 DIAGNOSIS — Z48815 Encounter for surgical aftercare following surgery on the digestive system: Secondary | ICD-10-CM | POA: Diagnosis not present

## 2020-04-29 DIAGNOSIS — D62 Acute posthemorrhagic anemia: Secondary | ICD-10-CM | POA: Diagnosis not present

## 2020-04-29 DIAGNOSIS — K5792 Diverticulitis of intestine, part unspecified, without perforation or abscess without bleeding: Secondary | ICD-10-CM | POA: Diagnosis not present

## 2020-04-29 DIAGNOSIS — Z431 Encounter for attention to gastrostomy: Secondary | ICD-10-CM | POA: Diagnosis not present

## 2020-05-02 DIAGNOSIS — I110 Hypertensive heart disease with heart failure: Secondary | ICD-10-CM | POA: Diagnosis not present

## 2020-05-02 DIAGNOSIS — G629 Polyneuropathy, unspecified: Secondary | ICD-10-CM | POA: Diagnosis not present

## 2020-05-02 DIAGNOSIS — J449 Chronic obstructive pulmonary disease, unspecified: Secondary | ICD-10-CM | POA: Diagnosis not present

## 2020-05-02 DIAGNOSIS — K5792 Diverticulitis of intestine, part unspecified, without perforation or abscess without bleeding: Secondary | ICD-10-CM | POA: Diagnosis not present

## 2020-05-02 DIAGNOSIS — M5186 Other intervertebral disc disorders, lumbar region: Secondary | ICD-10-CM | POA: Diagnosis not present

## 2020-05-02 DIAGNOSIS — D62 Acute posthemorrhagic anemia: Secondary | ICD-10-CM | POA: Diagnosis not present

## 2020-05-02 DIAGNOSIS — I5032 Chronic diastolic (congestive) heart failure: Secondary | ICD-10-CM | POA: Diagnosis not present

## 2020-05-02 DIAGNOSIS — Z431 Encounter for attention to gastrostomy: Secondary | ICD-10-CM | POA: Diagnosis not present

## 2020-05-02 DIAGNOSIS — E785 Hyperlipidemia, unspecified: Secondary | ICD-10-CM | POA: Diagnosis not present

## 2020-05-02 DIAGNOSIS — L309 Dermatitis, unspecified: Secondary | ICD-10-CM | POA: Diagnosis not present

## 2020-05-02 DIAGNOSIS — E440 Moderate protein-calorie malnutrition: Secondary | ICD-10-CM | POA: Diagnosis not present

## 2020-05-02 DIAGNOSIS — Z48815 Encounter for surgical aftercare following surgery on the digestive system: Secondary | ICD-10-CM | POA: Diagnosis not present

## 2020-05-02 DIAGNOSIS — I5020 Unspecified systolic (congestive) heart failure: Secondary | ICD-10-CM | POA: Diagnosis not present

## 2020-05-02 DIAGNOSIS — I482 Chronic atrial fibrillation, unspecified: Secondary | ICD-10-CM | POA: Diagnosis not present

## 2020-05-02 DIAGNOSIS — Z9981 Dependence on supplemental oxygen: Secondary | ICD-10-CM | POA: Diagnosis not present

## 2020-05-02 DIAGNOSIS — H409 Unspecified glaucoma: Secondary | ICD-10-CM | POA: Diagnosis not present

## 2020-05-02 DIAGNOSIS — Z7951 Long term (current) use of inhaled steroids: Secondary | ICD-10-CM | POA: Diagnosis not present

## 2020-05-02 DIAGNOSIS — F329 Major depressive disorder, single episode, unspecified: Secondary | ICD-10-CM | POA: Diagnosis not present

## 2020-05-02 DIAGNOSIS — Z7901 Long term (current) use of anticoagulants: Secondary | ICD-10-CM | POA: Diagnosis not present

## 2020-05-02 DIAGNOSIS — K219 Gastro-esophageal reflux disease without esophagitis: Secondary | ICD-10-CM | POA: Diagnosis not present

## 2020-05-02 DIAGNOSIS — G8929 Other chronic pain: Secondary | ICD-10-CM | POA: Diagnosis not present

## 2020-05-02 DIAGNOSIS — E039 Hypothyroidism, unspecified: Secondary | ICD-10-CM | POA: Diagnosis not present

## 2020-05-02 DIAGNOSIS — F32A Depression, unspecified: Secondary | ICD-10-CM | POA: Diagnosis not present

## 2020-05-02 DIAGNOSIS — K449 Diaphragmatic hernia without obstruction or gangrene: Secondary | ICD-10-CM | POA: Diagnosis not present

## 2020-05-02 DIAGNOSIS — J479 Bronchiectasis, uncomplicated: Secondary | ICD-10-CM | POA: Diagnosis not present

## 2020-05-02 DIAGNOSIS — F419 Anxiety disorder, unspecified: Secondary | ICD-10-CM | POA: Diagnosis not present

## 2020-05-02 DIAGNOSIS — Z8781 Personal history of (healed) traumatic fracture: Secondary | ICD-10-CM | POA: Diagnosis not present

## 2020-05-02 DIAGNOSIS — Z5181 Encounter for therapeutic drug level monitoring: Secondary | ICD-10-CM | POA: Diagnosis not present

## 2020-05-02 DIAGNOSIS — J849 Interstitial pulmonary disease, unspecified: Secondary | ICD-10-CM | POA: Diagnosis not present

## 2020-05-02 DIAGNOSIS — K220 Achalasia of cardia: Secondary | ICD-10-CM | POA: Diagnosis not present

## 2020-05-03 DIAGNOSIS — J479 Bronchiectasis, uncomplicated: Secondary | ICD-10-CM | POA: Diagnosis not present

## 2020-05-03 DIAGNOSIS — I5020 Unspecified systolic (congestive) heart failure: Secondary | ICD-10-CM | POA: Diagnosis not present

## 2020-05-03 DIAGNOSIS — I482 Chronic atrial fibrillation, unspecified: Secondary | ICD-10-CM | POA: Diagnosis not present

## 2020-05-03 DIAGNOSIS — J849 Interstitial pulmonary disease, unspecified: Secondary | ICD-10-CM | POA: Diagnosis not present

## 2020-05-03 DIAGNOSIS — I110 Hypertensive heart disease with heart failure: Secondary | ICD-10-CM | POA: Diagnosis not present

## 2020-05-03 DIAGNOSIS — I5032 Chronic diastolic (congestive) heart failure: Secondary | ICD-10-CM | POA: Diagnosis not present

## 2020-05-05 DIAGNOSIS — I5020 Unspecified systolic (congestive) heart failure: Secondary | ICD-10-CM | POA: Diagnosis not present

## 2020-05-05 DIAGNOSIS — J849 Interstitial pulmonary disease, unspecified: Secondary | ICD-10-CM | POA: Diagnosis not present

## 2020-05-05 DIAGNOSIS — I5032 Chronic diastolic (congestive) heart failure: Secondary | ICD-10-CM | POA: Diagnosis not present

## 2020-05-05 DIAGNOSIS — J479 Bronchiectasis, uncomplicated: Secondary | ICD-10-CM | POA: Diagnosis not present

## 2020-05-05 DIAGNOSIS — I482 Chronic atrial fibrillation, unspecified: Secondary | ICD-10-CM | POA: Diagnosis not present

## 2020-05-05 DIAGNOSIS — I110 Hypertensive heart disease with heart failure: Secondary | ICD-10-CM | POA: Diagnosis not present

## 2020-05-10 DIAGNOSIS — I10 Essential (primary) hypertension: Secondary | ICD-10-CM | POA: Diagnosis not present

## 2020-05-10 DIAGNOSIS — E782 Mixed hyperlipidemia: Secondary | ICD-10-CM | POA: Diagnosis not present

## 2020-05-10 DIAGNOSIS — I482 Chronic atrial fibrillation, unspecified: Secondary | ICD-10-CM | POA: Diagnosis not present

## 2020-05-10 DIAGNOSIS — Z7901 Long term (current) use of anticoagulants: Secondary | ICD-10-CM | POA: Diagnosis not present

## 2020-05-10 DIAGNOSIS — D649 Anemia, unspecified: Secondary | ICD-10-CM | POA: Diagnosis not present

## 2020-05-10 DIAGNOSIS — F419 Anxiety disorder, unspecified: Secondary | ICD-10-CM | POA: Diagnosis not present

## 2020-05-11 DIAGNOSIS — J479 Bronchiectasis, uncomplicated: Secondary | ICD-10-CM | POA: Diagnosis not present

## 2020-05-11 DIAGNOSIS — I5020 Unspecified systolic (congestive) heart failure: Secondary | ICD-10-CM | POA: Diagnosis not present

## 2020-05-11 DIAGNOSIS — I482 Chronic atrial fibrillation, unspecified: Secondary | ICD-10-CM | POA: Diagnosis not present

## 2020-05-11 DIAGNOSIS — I110 Hypertensive heart disease with heart failure: Secondary | ICD-10-CM | POA: Diagnosis not present

## 2020-05-11 DIAGNOSIS — J849 Interstitial pulmonary disease, unspecified: Secondary | ICD-10-CM | POA: Diagnosis not present

## 2020-05-11 DIAGNOSIS — I5032 Chronic diastolic (congestive) heart failure: Secondary | ICD-10-CM | POA: Diagnosis not present

## 2020-05-12 DIAGNOSIS — I482 Chronic atrial fibrillation, unspecified: Secondary | ICD-10-CM | POA: Diagnosis not present

## 2020-05-12 DIAGNOSIS — I5020 Unspecified systolic (congestive) heart failure: Secondary | ICD-10-CM | POA: Diagnosis not present

## 2020-05-12 DIAGNOSIS — J849 Interstitial pulmonary disease, unspecified: Secondary | ICD-10-CM | POA: Diagnosis not present

## 2020-05-12 DIAGNOSIS — J479 Bronchiectasis, uncomplicated: Secondary | ICD-10-CM | POA: Diagnosis not present

## 2020-05-12 DIAGNOSIS — I5032 Chronic diastolic (congestive) heart failure: Secondary | ICD-10-CM | POA: Diagnosis not present

## 2020-05-12 DIAGNOSIS — I110 Hypertensive heart disease with heart failure: Secondary | ICD-10-CM | POA: Diagnosis not present

## 2020-05-13 ENCOUNTER — Telehealth: Payer: Self-pay | Admitting: Internal Medicine

## 2020-05-13 NOTE — Telephone Encounter (Signed)
New Message  Pt is requesting to switch providers from Dr Caryl Comes to Dr Rayann Heman   Please advise

## 2020-05-14 NOTE — Telephone Encounter (Signed)
[place] with me if ok with SK.

## 2020-05-16 DIAGNOSIS — I5032 Chronic diastolic (congestive) heart failure: Secondary | ICD-10-CM | POA: Diagnosis not present

## 2020-05-16 DIAGNOSIS — I482 Chronic atrial fibrillation, unspecified: Secondary | ICD-10-CM | POA: Diagnosis not present

## 2020-05-16 DIAGNOSIS — J849 Interstitial pulmonary disease, unspecified: Secondary | ICD-10-CM | POA: Diagnosis not present

## 2020-05-16 DIAGNOSIS — I110 Hypertensive heart disease with heart failure: Secondary | ICD-10-CM | POA: Diagnosis not present

## 2020-05-16 DIAGNOSIS — I5020 Unspecified systolic (congestive) heart failure: Secondary | ICD-10-CM | POA: Diagnosis not present

## 2020-05-16 DIAGNOSIS — J479 Bronchiectasis, uncomplicated: Secondary | ICD-10-CM | POA: Diagnosis not present

## 2020-05-16 NOTE — Telephone Encounter (Signed)
Fine wi8th me Thanks SK

## 2020-05-17 DIAGNOSIS — I5020 Unspecified systolic (congestive) heart failure: Secondary | ICD-10-CM | POA: Diagnosis not present

## 2020-05-17 DIAGNOSIS — I110 Hypertensive heart disease with heart failure: Secondary | ICD-10-CM | POA: Diagnosis not present

## 2020-05-17 DIAGNOSIS — I509 Heart failure, unspecified: Secondary | ICD-10-CM | POA: Diagnosis not present

## 2020-05-17 DIAGNOSIS — J479 Bronchiectasis, uncomplicated: Secondary | ICD-10-CM | POA: Diagnosis not present

## 2020-05-17 DIAGNOSIS — I482 Chronic atrial fibrillation, unspecified: Secondary | ICD-10-CM | POA: Diagnosis not present

## 2020-05-17 DIAGNOSIS — J849 Interstitial pulmonary disease, unspecified: Secondary | ICD-10-CM | POA: Diagnosis not present

## 2020-05-17 DIAGNOSIS — I5032 Chronic diastolic (congestive) heart failure: Secondary | ICD-10-CM | POA: Diagnosis not present

## 2020-05-17 NOTE — Telephone Encounter (Signed)
Follow up  [facility] can you please schedule this patient with Dr Rayann Heman?   Thank you!

## 2020-05-18 DIAGNOSIS — I110 Hypertensive heart disease with heart failure: Secondary | ICD-10-CM | POA: Diagnosis not present

## 2020-05-18 DIAGNOSIS — J849 Interstitial pulmonary disease, unspecified: Secondary | ICD-10-CM | POA: Diagnosis not present

## 2020-05-18 DIAGNOSIS — J479 Bronchiectasis, uncomplicated: Secondary | ICD-10-CM | POA: Diagnosis not present

## 2020-05-18 DIAGNOSIS — I5032 Chronic diastolic (congestive) heart failure: Secondary | ICD-10-CM | POA: Diagnosis not present

## 2020-05-18 DIAGNOSIS — I482 Chronic atrial fibrillation, unspecified: Secondary | ICD-10-CM | POA: Diagnosis not present

## 2020-05-18 DIAGNOSIS — I5020 Unspecified systolic (congestive) heart failure: Secondary | ICD-10-CM | POA: Diagnosis not present

## 2020-05-19 ENCOUNTER — Encounter: Payer: Medicare Other | Admitting: Physical Medicine & Rehabilitation

## 2020-05-23 DIAGNOSIS — I5032 Chronic diastolic (congestive) heart failure: Secondary | ICD-10-CM | POA: Diagnosis not present

## 2020-05-23 DIAGNOSIS — J849 Interstitial pulmonary disease, unspecified: Secondary | ICD-10-CM | POA: Diagnosis not present

## 2020-05-23 DIAGNOSIS — J479 Bronchiectasis, uncomplicated: Secondary | ICD-10-CM | POA: Diagnosis not present

## 2020-05-23 DIAGNOSIS — I5020 Unspecified systolic (congestive) heart failure: Secondary | ICD-10-CM | POA: Diagnosis not present

## 2020-05-23 DIAGNOSIS — I482 Chronic atrial fibrillation, unspecified: Secondary | ICD-10-CM | POA: Diagnosis not present

## 2020-05-23 DIAGNOSIS — I110 Hypertensive heart disease with heart failure: Secondary | ICD-10-CM | POA: Diagnosis not present

## 2020-05-24 ENCOUNTER — Encounter: Payer: Medicare Other | Admitting: Physical Medicine & Rehabilitation

## 2020-05-24 DIAGNOSIS — I5032 Chronic diastolic (congestive) heart failure: Secondary | ICD-10-CM | POA: Diagnosis not present

## 2020-05-24 DIAGNOSIS — J849 Interstitial pulmonary disease, unspecified: Secondary | ICD-10-CM | POA: Diagnosis not present

## 2020-05-24 DIAGNOSIS — I5020 Unspecified systolic (congestive) heart failure: Secondary | ICD-10-CM | POA: Diagnosis not present

## 2020-05-24 DIAGNOSIS — I482 Chronic atrial fibrillation, unspecified: Secondary | ICD-10-CM | POA: Diagnosis not present

## 2020-05-24 DIAGNOSIS — I110 Hypertensive heart disease with heart failure: Secondary | ICD-10-CM | POA: Diagnosis not present

## 2020-05-24 DIAGNOSIS — J479 Bronchiectasis, uncomplicated: Secondary | ICD-10-CM | POA: Diagnosis not present

## 2020-05-30 DIAGNOSIS — I482 Chronic atrial fibrillation, unspecified: Secondary | ICD-10-CM | POA: Diagnosis not present

## 2020-05-30 DIAGNOSIS — I110 Hypertensive heart disease with heart failure: Secondary | ICD-10-CM | POA: Diagnosis not present

## 2020-05-30 DIAGNOSIS — I5032 Chronic diastolic (congestive) heart failure: Secondary | ICD-10-CM | POA: Diagnosis not present

## 2020-05-30 DIAGNOSIS — J479 Bronchiectasis, uncomplicated: Secondary | ICD-10-CM | POA: Diagnosis not present

## 2020-05-30 DIAGNOSIS — I5020 Unspecified systolic (congestive) heart failure: Secondary | ICD-10-CM | POA: Diagnosis not present

## 2020-05-30 DIAGNOSIS — J849 Interstitial pulmonary disease, unspecified: Secondary | ICD-10-CM | POA: Diagnosis not present

## 2020-06-01 DIAGNOSIS — I5020 Unspecified systolic (congestive) heart failure: Secondary | ICD-10-CM | POA: Diagnosis not present

## 2020-06-01 DIAGNOSIS — I482 Chronic atrial fibrillation, unspecified: Secondary | ICD-10-CM | POA: Diagnosis not present

## 2020-06-01 DIAGNOSIS — K220 Achalasia of cardia: Secondary | ICD-10-CM | POA: Diagnosis not present

## 2020-06-01 DIAGNOSIS — F419 Anxiety disorder, unspecified: Secondary | ICD-10-CM | POA: Diagnosis not present

## 2020-06-01 DIAGNOSIS — Z8781 Personal history of (healed) traumatic fracture: Secondary | ICD-10-CM | POA: Diagnosis not present

## 2020-06-01 DIAGNOSIS — E785 Hyperlipidemia, unspecified: Secondary | ICD-10-CM | POA: Diagnosis not present

## 2020-06-01 DIAGNOSIS — G629 Polyneuropathy, unspecified: Secondary | ICD-10-CM | POA: Diagnosis not present

## 2020-06-01 DIAGNOSIS — K449 Diaphragmatic hernia without obstruction or gangrene: Secondary | ICD-10-CM | POA: Diagnosis not present

## 2020-06-01 DIAGNOSIS — I110 Hypertensive heart disease with heart failure: Secondary | ICD-10-CM | POA: Diagnosis not present

## 2020-06-01 DIAGNOSIS — K219 Gastro-esophageal reflux disease without esophagitis: Secondary | ICD-10-CM | POA: Diagnosis not present

## 2020-06-01 DIAGNOSIS — D62 Acute posthemorrhagic anemia: Secondary | ICD-10-CM | POA: Diagnosis not present

## 2020-06-01 DIAGNOSIS — Z9981 Dependence on supplemental oxygen: Secondary | ICD-10-CM | POA: Diagnosis not present

## 2020-06-01 DIAGNOSIS — I5032 Chronic diastolic (congestive) heart failure: Secondary | ICD-10-CM | POA: Diagnosis not present

## 2020-06-01 DIAGNOSIS — J479 Bronchiectasis, uncomplicated: Secondary | ICD-10-CM | POA: Diagnosis not present

## 2020-06-01 DIAGNOSIS — E039 Hypothyroidism, unspecified: Secondary | ICD-10-CM | POA: Diagnosis not present

## 2020-06-01 DIAGNOSIS — G8929 Other chronic pain: Secondary | ICD-10-CM | POA: Diagnosis not present

## 2020-06-01 DIAGNOSIS — Z7901 Long term (current) use of anticoagulants: Secondary | ICD-10-CM | POA: Diagnosis not present

## 2020-06-01 DIAGNOSIS — E440 Moderate protein-calorie malnutrition: Secondary | ICD-10-CM | POA: Diagnosis not present

## 2020-06-01 DIAGNOSIS — Z7951 Long term (current) use of inhaled steroids: Secondary | ICD-10-CM | POA: Diagnosis not present

## 2020-06-01 DIAGNOSIS — L309 Dermatitis, unspecified: Secondary | ICD-10-CM | POA: Diagnosis not present

## 2020-06-01 DIAGNOSIS — M5186 Other intervertebral disc disorders, lumbar region: Secondary | ICD-10-CM | POA: Diagnosis not present

## 2020-06-01 DIAGNOSIS — F32A Depression, unspecified: Secondary | ICD-10-CM | POA: Diagnosis not present

## 2020-06-01 DIAGNOSIS — Z5181 Encounter for therapeutic drug level monitoring: Secondary | ICD-10-CM | POA: Diagnosis not present

## 2020-06-01 DIAGNOSIS — H409 Unspecified glaucoma: Secondary | ICD-10-CM | POA: Diagnosis not present

## 2020-06-01 DIAGNOSIS — J849 Interstitial pulmonary disease, unspecified: Secondary | ICD-10-CM | POA: Diagnosis not present

## 2020-06-02 DIAGNOSIS — J479 Bronchiectasis, uncomplicated: Secondary | ICD-10-CM | POA: Diagnosis not present

## 2020-06-02 DIAGNOSIS — I482 Chronic atrial fibrillation, unspecified: Secondary | ICD-10-CM | POA: Diagnosis not present

## 2020-06-02 DIAGNOSIS — I110 Hypertensive heart disease with heart failure: Secondary | ICD-10-CM | POA: Diagnosis not present

## 2020-06-02 DIAGNOSIS — J849 Interstitial pulmonary disease, unspecified: Secondary | ICD-10-CM | POA: Diagnosis not present

## 2020-06-02 DIAGNOSIS — I5032 Chronic diastolic (congestive) heart failure: Secondary | ICD-10-CM | POA: Diagnosis not present

## 2020-06-02 DIAGNOSIS — I5020 Unspecified systolic (congestive) heart failure: Secondary | ICD-10-CM | POA: Diagnosis not present

## 2020-06-06 DIAGNOSIS — I110 Hypertensive heart disease with heart failure: Secondary | ICD-10-CM | POA: Diagnosis not present

## 2020-06-06 DIAGNOSIS — J479 Bronchiectasis, uncomplicated: Secondary | ICD-10-CM | POA: Diagnosis not present

## 2020-06-06 DIAGNOSIS — I5020 Unspecified systolic (congestive) heart failure: Secondary | ICD-10-CM | POA: Diagnosis not present

## 2020-06-06 DIAGNOSIS — I5032 Chronic diastolic (congestive) heart failure: Secondary | ICD-10-CM | POA: Diagnosis not present

## 2020-06-06 DIAGNOSIS — I482 Chronic atrial fibrillation, unspecified: Secondary | ICD-10-CM | POA: Diagnosis not present

## 2020-06-06 DIAGNOSIS — J849 Interstitial pulmonary disease, unspecified: Secondary | ICD-10-CM | POA: Diagnosis not present

## 2020-06-07 DIAGNOSIS — I110 Hypertensive heart disease with heart failure: Secondary | ICD-10-CM | POA: Diagnosis not present

## 2020-06-07 DIAGNOSIS — J479 Bronchiectasis, uncomplicated: Secondary | ICD-10-CM | POA: Diagnosis not present

## 2020-06-07 DIAGNOSIS — I5032 Chronic diastolic (congestive) heart failure: Secondary | ICD-10-CM | POA: Diagnosis not present

## 2020-06-07 DIAGNOSIS — J849 Interstitial pulmonary disease, unspecified: Secondary | ICD-10-CM | POA: Diagnosis not present

## 2020-06-07 DIAGNOSIS — I5020 Unspecified systolic (congestive) heart failure: Secondary | ICD-10-CM | POA: Diagnosis not present

## 2020-06-07 DIAGNOSIS — I482 Chronic atrial fibrillation, unspecified: Secondary | ICD-10-CM | POA: Diagnosis not present

## 2020-06-15 DIAGNOSIS — I5020 Unspecified systolic (congestive) heart failure: Secondary | ICD-10-CM | POA: Diagnosis not present

## 2020-06-15 DIAGNOSIS — I5032 Chronic diastolic (congestive) heart failure: Secondary | ICD-10-CM | POA: Diagnosis not present

## 2020-06-15 DIAGNOSIS — J479 Bronchiectasis, uncomplicated: Secondary | ICD-10-CM | POA: Diagnosis not present

## 2020-06-15 DIAGNOSIS — I110 Hypertensive heart disease with heart failure: Secondary | ICD-10-CM | POA: Diagnosis not present

## 2020-06-15 DIAGNOSIS — J849 Interstitial pulmonary disease, unspecified: Secondary | ICD-10-CM | POA: Diagnosis not present

## 2020-06-15 DIAGNOSIS — I482 Chronic atrial fibrillation, unspecified: Secondary | ICD-10-CM | POA: Diagnosis not present

## 2020-06-16 DIAGNOSIS — I5032 Chronic diastolic (congestive) heart failure: Secondary | ICD-10-CM | POA: Diagnosis not present

## 2020-06-16 DIAGNOSIS — I110 Hypertensive heart disease with heart failure: Secondary | ICD-10-CM | POA: Diagnosis not present

## 2020-06-16 DIAGNOSIS — I482 Chronic atrial fibrillation, unspecified: Secondary | ICD-10-CM | POA: Diagnosis not present

## 2020-06-16 DIAGNOSIS — J849 Interstitial pulmonary disease, unspecified: Secondary | ICD-10-CM | POA: Diagnosis not present

## 2020-06-16 DIAGNOSIS — I5020 Unspecified systolic (congestive) heart failure: Secondary | ICD-10-CM | POA: Diagnosis not present

## 2020-06-16 DIAGNOSIS — J479 Bronchiectasis, uncomplicated: Secondary | ICD-10-CM | POA: Diagnosis not present

## 2020-06-20 ENCOUNTER — Encounter: Payer: Medicare Other | Attending: Physical Medicine & Rehabilitation | Admitting: Physical Medicine & Rehabilitation

## 2020-06-20 ENCOUNTER — Encounter: Payer: Self-pay | Admitting: Physical Medicine & Rehabilitation

## 2020-06-20 ENCOUNTER — Other Ambulatory Visit: Payer: Self-pay

## 2020-06-20 VITALS — BP 157/85 | HR 97 | Temp 98.8°F | Ht 66.5 in | Wt 156.0 lb

## 2020-06-20 DIAGNOSIS — K559 Vascular disorder of intestine, unspecified: Secondary | ICD-10-CM | POA: Diagnosis not present

## 2020-06-20 DIAGNOSIS — M25561 Pain in right knee: Secondary | ICD-10-CM

## 2020-06-20 DIAGNOSIS — I2510 Atherosclerotic heart disease of native coronary artery without angina pectoris: Secondary | ICD-10-CM

## 2020-06-20 DIAGNOSIS — G8929 Other chronic pain: Secondary | ICD-10-CM | POA: Diagnosis not present

## 2020-06-20 DIAGNOSIS — G8918 Other acute postprocedural pain: Secondary | ICD-10-CM

## 2020-06-20 DIAGNOSIS — R269 Unspecified abnormalities of gait and mobility: Secondary | ICD-10-CM | POA: Diagnosis not present

## 2020-06-20 MED ORDER — DICLOFENAC SODIUM 1 % EX GEL: 2.0000 g | Freq: Four times a day (QID) | CUTANEOUS | 1 refills | Status: DC

## 2020-06-20 NOTE — Progress Notes (Addendum)
Subjective:    Patient ID: Stephanie Cortez, female    DOB: 02/04/1934, 86 y.o.   MRN: [id number]  HPI Right-handed female with history of atrial fibrillation maintained on Coumadin, interstitial lung disease maintained on oxygen, anxiety, diastolic congestive heart failure, COPD, hypertension, history of right hip fracture, colitis with diverticulitis presents for follow up for small bowel ischemia status post exploratory laparotomy lysis of adhesions small bowel resection gastrostomy tube placement 02/08/2020.  Last clinic visit on 03/05/20.  Daughter supplements history. Since that time, pt is still in therapies. Her G-tube has been removed. She has an appointment with Cards. Bowel movements are regular.  Denies falls. She complains or right knee pain x3 years.    Pain Inventory Average Pain 7 Pain Right Now 7 My pain is intermittent, constant, burning and tingling  In the last 24 hours, has pain interfered with the following? General activity 7 Relation with others 8 Enjoyment of life 8 What TIME of day is your pain at its worst? daytime and evening Sleep (in general) Fair  Pain is worse with: bending and sitting Pain improves with: rest, therapy/exercise and medication Relief from Meds: Tylenol helps  walk with assistance use a walker how many minutes can you walk? more than 15-20 mins ability to climb steps?  yes do you drive?  no  retired I need assistance with the following:  dressing, bathing, toileting, meal prep, household duties and shopping Do you have any goals in this area?  yes  tingling trouble walking  Any changes since last visit?  no  Any changes since last visit?  no    Family History  Problem Relation Age of Onset  . Hypertension Mother   . Stroke Mother   . Emphysema Brother   . Other Father        miner's lung  . Cancer Father        Lung  . Colon polyps Sister   . Pancreatic cancer Sister   . Kidney disease Sister   . Atrial fibrillation  Other        siblings   Social History   Socioeconomic History  . Marital status: Married    Spouse name: Not on file  . Number of children: 2  . Years of education: Not on file  . Highest education level: Not on file  Occupational History  . Occupation: retired Product manager: [employer]  Tobacco Use  . Smoking status: Never Smoker  . Smokeless tobacco: Never Used  Vaping Use  . Vaping Use: Never used  Substance and Sexual Activity  . Alcohol use: No  . Drug use: No  . Sexual activity: Not on file    Comment: Hysterectomy  Other Topics Concern  . Not on file  Social History Narrative   2 children 4 grandchildren   Recently moved to [place] from [place]   Retired Pharmacist, hospital   Grew up in [place]. Moved to [place]. Moved to [place] Feb 2010 due to duaghther living in [place]. Son lives in [place], [place]. Husband works as Scientist, physiological in [place].   Social Determinants of Health   Financial Resource Strain: Not on file  Food Insecurity: Not on file  Transportation Needs: Not on file  Physical Activity: Not on file  Stress: Not on file  Social Connections: Not on file   Past Surgical History:  Procedure Laterality Date  . BOWEL RESECTION N/A 02/08/2020   Procedure: SMALL BOWEL RESECTION;  Surgeon:  Donnie Mesa, MD;  Location: [facility];  Service: General;  Laterality: N/A;  . BREAST SURGERY     br bx  . CARDIAC CATHETERIZATION  07/01/2013  . CATARACT EXTRACTION     both  . COLONOSCOPY    . ESOPHAGOSCOPY W/ BOTOX INJECTION  12/11/2011   Procedure: ESOPHAGOSCOPY WITH BOTOX INJECTION;  Surgeon: Rozetta Nunnery, MD;  Location: [facility];  Service: ENT;  Laterality: N/A;  esophogoscopy with dilation, botox injection  . FOOT SURGERY  03/14/2011   gastroc slide-rt  . GASTROSTOMY N/A 02/08/2020   Procedure: INSERTION OF GASTROSTOMY TUBE;  Surgeon: Donnie Mesa, MD;  Location: [facility];  Service: General;  Laterality: N/A;  . HIP  ARTHROPLASTY Right 10/28/2019   Procedure: ARTHROPLASTY BIPOLAR HIP (HEMIARTHROPLASTY);  Surgeon: Marybelle Killings, MD;  [facility];  Service: Orthopedics;  Laterality: Right;  . LAPAROTOMY N/A 02/08/2020   Procedure: EXPLORATORY LAPAROTOMY;  Surgeon: Donnie Mesa, MD;  Location: [facility];  Service: General;  Laterality: N/A;  . LYSIS OF ADHESION N/A 02/08/2020   Procedure: LYSIS OF ADHESIONS;  Surgeon: Donnie Mesa, MD;  Location: [facility];  Service: General;  Laterality: N/A;  . TOTAL ABDOMINAL HYSTERECTOMY    . TOTAL HIP REVISION Right 11/16/2019   Procedure: TOTAL HIP REVISION BIPOLAR TO CEMENTED BIPOLAR;  Surgeon: Marybelle Killings, MD;  Location: [facility];  Service: Orthopedics;  Laterality: Right;  . WISDOM TOOTH EXTRACTION     Past Medical History:  Diagnosis Date  . Anemia    - Hgb 9.7gm% on 07/13/2008 in [place] -  Hgg 129gm% wiht normal irone levsl and ferritin 10/27/2008 in [facility] Recurrent otitis/sinusitis  . Anxiety    chronic BZ prn  . Atrial fibrillation (HCC)    chronic anticoag  . Bronchiectasis    >PFT 07/13/2008 in [facility] 1.9L/76%, FVC 2.45L/74, Ratio 79, TLC 121%, DLCO 64%  AE BRonchiectasis - Dec 2010.New Rx:  outpatient - Feb 2011 - Rx outpatient  . CHF (congestive heart failure) ([facility])   . COPD (chronic obstructive pulmonary disease) (HCC)    bronchiectasis  . Cricopharyngeal achalasia   . Depression   . Diverticulosis   . Dyslipidemia   . Eczema   . GERD with stricture   . Glaucoma   . H. pylori infection   . Hypertension   . Hyponatremia    chronic, s/p endo eval 06/2012  . Hypothyroid   . IBS (irritable bowel syndrome)   . Lumbar disc disease   . Neuropathy of both feet   . Segmental colitis ([facility])   . Vertigo   . Wears glasses    BP (!) 157/85   Pulse 97   Temp 98.8 F (37.1 C)   Ht 5' 6.5" (1.689 m)   Wt 156 lb (70.8 kg)   LMP  (LMP Unknown)   SpO2 93%   BMI 24.80 kg/m   Opioid Risk Score:   Fall Risk Score:  `1  Depression screen PHQ  2/9  Depression screen PHQ 2/9 03/15/2020  Decreased Interest 0  Down, Depressed, Hopeless 0  PHQ - 2 Score 0  Altered sleeping 0  Tired, decreased energy 0  Change in appetite 0  Feeling bad or failure about yourself  0  Trouble concentrating 0  Moving slowly or fidgety/restless 0  Suicidal thoughts 0  PHQ-9 Score 0  Some recent data might be hidden   Review of Systems  Constitutional: Negative.   HENT: Negative.   Eyes: Negative.  Respiratory: Negative.   Cardiovascular: Positive for leg swelling.  Gastrointestinal:       Feeding tub discomfort in the stomach  Endocrine: Negative.   Genitourinary: Negative.   Musculoskeletal: Positive for arthralgias, gait problem and myalgias.       Rest leg syndrome  Skin: Negative.   Allergic/Immunologic: Negative.   Neurological: Positive for weakness.       Tingling   Hematological: Bruises/bleeds easily.  Psychiatric/Behavioral: Negative.        Objective:   Physical Exam  Constitutional: No distress . Vital signs reviewed. HENT: Normocephalic.  Atraumatic. Eyes: EOMI. No discharge. Cardiovascular: No JVD.   Respiratory: Normal effort.  No stridor.   GI: Non-distended.   Skin: Warm and dry.  Varicose veins. Psych: Normal mood.  Normal behavior. Musc: LE edema Hyperemia LE  Right knee TTP Neuro: Alert Motor: Bilateral upper extremities, left lower extremity: 5/5 proximal distally Right lower extremity: 4+/5 proximally, 5/5 distally, some pain inhibition    Assessment & Plan:  Right-handed female with history of atrial fibrillation maintained on Coumadin, interstitial lung disease maintained on oxygen, anxiety, diastolic congestive heart failure, COPD, hypertension, history of right hip fracture with hemiarthroplasty, colitis with diverticulitis presents for follow-up for small bowel ischemia status post exploratory laparotomy lysis of adhesions small bowel resection gastrostomy tube placement 02/08/2020.  1.    Debility  secondary to small bowel ischemia status post exploratory laparotomy lysis of adhesions small bowel resection gastrostomy tube placement 02/08/2020  Continue therapies  2. Pain Management:   Continue prn tylenol   Tramadol per PCP, encouraged to wean  3.  Atrial fibrillation.    Continue meds  Follow up with Cards  4.  Constipation  Controlled with meds  5.  Gait abnormality  Continue therapies  Continue walker for safety  6. Right knee pain - exacerbated by right hip pain  Voltaren gel ordered  Will consider imagining if necessary  7. Peripheral edema  Follow up with Cards for varicose veins and edema   >30 minutes spent with patient and daughter in counseling regarding prognosis, knee pain, edema, varicose veins, ambulation, etc.

## 2020-06-20 NOTE — Addendum Note (Signed)
Addended by: Delice Lesch A on: 06/20/2020 03:01 PM   Modules accepted: Level of Service

## 2020-06-21 DIAGNOSIS — I110 Hypertensive heart disease with heart failure: Secondary | ICD-10-CM | POA: Diagnosis not present

## 2020-06-21 DIAGNOSIS — J849 Interstitial pulmonary disease, unspecified: Secondary | ICD-10-CM | POA: Diagnosis not present

## 2020-06-21 DIAGNOSIS — I5020 Unspecified systolic (congestive) heart failure: Secondary | ICD-10-CM | POA: Diagnosis not present

## 2020-06-21 DIAGNOSIS — I5032 Chronic diastolic (congestive) heart failure: Secondary | ICD-10-CM | POA: Diagnosis not present

## 2020-06-21 DIAGNOSIS — I482 Chronic atrial fibrillation, unspecified: Secondary | ICD-10-CM | POA: Diagnosis not present

## 2020-06-21 DIAGNOSIS — J479 Bronchiectasis, uncomplicated: Secondary | ICD-10-CM | POA: Diagnosis not present

## 2020-06-22 DIAGNOSIS — I5020 Unspecified systolic (congestive) heart failure: Secondary | ICD-10-CM | POA: Diagnosis not present

## 2020-06-22 DIAGNOSIS — J479 Bronchiectasis, uncomplicated: Secondary | ICD-10-CM | POA: Diagnosis not present

## 2020-06-22 DIAGNOSIS — I110 Hypertensive heart disease with heart failure: Secondary | ICD-10-CM | POA: Diagnosis not present

## 2020-06-22 DIAGNOSIS — I5032 Chronic diastolic (congestive) heart failure: Secondary | ICD-10-CM | POA: Diagnosis not present

## 2020-06-22 DIAGNOSIS — J849 Interstitial pulmonary disease, unspecified: Secondary | ICD-10-CM | POA: Diagnosis not present

## 2020-06-22 DIAGNOSIS — I482 Chronic atrial fibrillation, unspecified: Secondary | ICD-10-CM | POA: Diagnosis not present

## 2020-06-28 DIAGNOSIS — I110 Hypertensive heart disease with heart failure: Secondary | ICD-10-CM | POA: Diagnosis not present

## 2020-06-28 DIAGNOSIS — I5020 Unspecified systolic (congestive) heart failure: Secondary | ICD-10-CM | POA: Diagnosis not present

## 2020-06-28 DIAGNOSIS — I5032 Chronic diastolic (congestive) heart failure: Secondary | ICD-10-CM | POA: Diagnosis not present

## 2020-06-28 DIAGNOSIS — I482 Chronic atrial fibrillation, unspecified: Secondary | ICD-10-CM | POA: Diagnosis not present

## 2020-06-28 DIAGNOSIS — J479 Bronchiectasis, uncomplicated: Secondary | ICD-10-CM | POA: Diagnosis not present

## 2020-06-28 DIAGNOSIS — J849 Interstitial pulmonary disease, unspecified: Secondary | ICD-10-CM | POA: Diagnosis not present

## 2020-06-29 ENCOUNTER — Telehealth: Payer: Self-pay

## 2020-06-29 DIAGNOSIS — J479 Bronchiectasis, uncomplicated: Secondary | ICD-10-CM | POA: Diagnosis not present

## 2020-06-29 DIAGNOSIS — I5032 Chronic diastolic (congestive) heart failure: Secondary | ICD-10-CM | POA: Diagnosis not present

## 2020-06-29 DIAGNOSIS — I482 Chronic atrial fibrillation, unspecified: Secondary | ICD-10-CM | POA: Diagnosis not present

## 2020-06-29 DIAGNOSIS — J849 Interstitial pulmonary disease, unspecified: Secondary | ICD-10-CM | POA: Diagnosis not present

## 2020-06-29 DIAGNOSIS — I110 Hypertensive heart disease with heart failure: Secondary | ICD-10-CM | POA: Diagnosis not present

## 2020-06-29 DIAGNOSIS — I5020 Unspecified systolic (congestive) heart failure: Secondary | ICD-10-CM | POA: Diagnosis not present

## 2020-06-29 NOTE — Telephone Encounter (Signed)
Recd letter from daughter Forde Radon asking about progress notes -- Dr Allena Katz reviewed - he said HPI is transferred from Acute side of hospital as part of their subjective data - not his "notations" just history - telephone lvm for ann chapin to advise

## 2020-07-01 DIAGNOSIS — Z7901 Long term (current) use of anticoagulants: Secondary | ICD-10-CM | POA: Diagnosis not present

## 2020-07-01 DIAGNOSIS — J479 Bronchiectasis, uncomplicated: Secondary | ICD-10-CM | POA: Diagnosis not present

## 2020-07-01 DIAGNOSIS — E039 Hypothyroidism, unspecified: Secondary | ICD-10-CM | POA: Diagnosis not present

## 2020-07-01 DIAGNOSIS — I5032 Chronic diastolic (congestive) heart failure: Secondary | ICD-10-CM | POA: Diagnosis not present

## 2020-07-01 DIAGNOSIS — M5186 Other intervertebral disc disorders, lumbar region: Secondary | ICD-10-CM | POA: Diagnosis not present

## 2020-07-01 DIAGNOSIS — L309 Dermatitis, unspecified: Secondary | ICD-10-CM | POA: Diagnosis not present

## 2020-07-01 DIAGNOSIS — F32A Depression, unspecified: Secondary | ICD-10-CM | POA: Diagnosis not present

## 2020-07-01 DIAGNOSIS — E785 Hyperlipidemia, unspecified: Secondary | ICD-10-CM | POA: Diagnosis not present

## 2020-07-01 DIAGNOSIS — Z9981 Dependence on supplemental oxygen: Secondary | ICD-10-CM | POA: Diagnosis not present

## 2020-07-01 DIAGNOSIS — G629 Polyneuropathy, unspecified: Secondary | ICD-10-CM | POA: Diagnosis not present

## 2020-07-01 DIAGNOSIS — K220 Achalasia of cardia: Secondary | ICD-10-CM | POA: Diagnosis not present

## 2020-07-01 DIAGNOSIS — K449 Diaphragmatic hernia without obstruction or gangrene: Secondary | ICD-10-CM | POA: Diagnosis not present

## 2020-07-01 DIAGNOSIS — M1990 Unspecified osteoarthritis, unspecified site: Secondary | ICD-10-CM | POA: Diagnosis not present

## 2020-07-01 DIAGNOSIS — I5020 Unspecified systolic (congestive) heart failure: Secondary | ICD-10-CM | POA: Diagnosis not present

## 2020-07-01 DIAGNOSIS — F419 Anxiety disorder, unspecified: Secondary | ICD-10-CM | POA: Diagnosis not present

## 2020-07-01 DIAGNOSIS — J849 Interstitial pulmonary disease, unspecified: Secondary | ICD-10-CM | POA: Diagnosis not present

## 2020-07-01 DIAGNOSIS — K219 Gastro-esophageal reflux disease without esophagitis: Secondary | ICD-10-CM | POA: Diagnosis not present

## 2020-07-01 DIAGNOSIS — D62 Acute posthemorrhagic anemia: Secondary | ICD-10-CM | POA: Diagnosis not present

## 2020-07-01 DIAGNOSIS — G8929 Other chronic pain: Secondary | ICD-10-CM | POA: Diagnosis not present

## 2020-07-01 DIAGNOSIS — Z8781 Personal history of (healed) traumatic fracture: Secondary | ICD-10-CM | POA: Diagnosis not present

## 2020-07-01 DIAGNOSIS — Z7951 Long term (current) use of inhaled steroids: Secondary | ICD-10-CM | POA: Diagnosis not present

## 2020-07-01 DIAGNOSIS — H409 Unspecified glaucoma: Secondary | ICD-10-CM | POA: Diagnosis not present

## 2020-07-01 DIAGNOSIS — Z5181 Encounter for therapeutic drug level monitoring: Secondary | ICD-10-CM | POA: Diagnosis not present

## 2020-07-01 DIAGNOSIS — I482 Chronic atrial fibrillation, unspecified: Secondary | ICD-10-CM | POA: Diagnosis not present

## 2020-07-01 DIAGNOSIS — E440 Moderate protein-calorie malnutrition: Secondary | ICD-10-CM | POA: Diagnosis not present

## 2020-07-01 DIAGNOSIS — I110 Hypertensive heart disease with heart failure: Secondary | ICD-10-CM | POA: Diagnosis not present

## 2020-07-04 DIAGNOSIS — J849 Interstitial pulmonary disease, unspecified: Secondary | ICD-10-CM | POA: Diagnosis not present

## 2020-07-04 DIAGNOSIS — I5020 Unspecified systolic (congestive) heart failure: Secondary | ICD-10-CM | POA: Diagnosis not present

## 2020-07-04 DIAGNOSIS — I5032 Chronic diastolic (congestive) heart failure: Secondary | ICD-10-CM | POA: Diagnosis not present

## 2020-07-04 DIAGNOSIS — I110 Hypertensive heart disease with heart failure: Secondary | ICD-10-CM | POA: Diagnosis not present

## 2020-07-04 DIAGNOSIS — I482 Chronic atrial fibrillation, unspecified: Secondary | ICD-10-CM | POA: Diagnosis not present

## 2020-07-04 DIAGNOSIS — J479 Bronchiectasis, uncomplicated: Secondary | ICD-10-CM | POA: Diagnosis not present

## 2020-07-06 DIAGNOSIS — I482 Chronic atrial fibrillation, unspecified: Secondary | ICD-10-CM | POA: Diagnosis not present

## 2020-07-06 DIAGNOSIS — J849 Interstitial pulmonary disease, unspecified: Secondary | ICD-10-CM | POA: Diagnosis not present

## 2020-07-06 DIAGNOSIS — I110 Hypertensive heart disease with heart failure: Secondary | ICD-10-CM | POA: Diagnosis not present

## 2020-07-06 DIAGNOSIS — I5032 Chronic diastolic (congestive) heart failure: Secondary | ICD-10-CM | POA: Diagnosis not present

## 2020-07-06 DIAGNOSIS — I5020 Unspecified systolic (congestive) heart failure: Secondary | ICD-10-CM | POA: Diagnosis not present

## 2020-07-06 DIAGNOSIS — J479 Bronchiectasis, uncomplicated: Secondary | ICD-10-CM | POA: Diagnosis not present

## 2020-07-08 ENCOUNTER — Ambulatory Visit: Payer: Medicare Other | Admitting: Podiatry

## 2020-07-11 DIAGNOSIS — J849 Interstitial pulmonary disease, unspecified: Secondary | ICD-10-CM | POA: Diagnosis not present

## 2020-07-11 DIAGNOSIS — I482 Chronic atrial fibrillation, unspecified: Secondary | ICD-10-CM | POA: Diagnosis not present

## 2020-07-11 DIAGNOSIS — J479 Bronchiectasis, uncomplicated: Secondary | ICD-10-CM | POA: Diagnosis not present

## 2020-07-11 DIAGNOSIS — I110 Hypertensive heart disease with heart failure: Secondary | ICD-10-CM | POA: Diagnosis not present

## 2020-07-11 DIAGNOSIS — I5032 Chronic diastolic (congestive) heart failure: Secondary | ICD-10-CM | POA: Diagnosis not present

## 2020-07-11 DIAGNOSIS — I5020 Unspecified systolic (congestive) heart failure: Secondary | ICD-10-CM | POA: Diagnosis not present

## 2020-07-15 DIAGNOSIS — I5032 Chronic diastolic (congestive) heart failure: Secondary | ICD-10-CM | POA: Diagnosis not present

## 2020-07-15 DIAGNOSIS — I5020 Unspecified systolic (congestive) heart failure: Secondary | ICD-10-CM | POA: Diagnosis not present

## 2020-07-15 DIAGNOSIS — I110 Hypertensive heart disease with heart failure: Secondary | ICD-10-CM | POA: Diagnosis not present

## 2020-07-15 DIAGNOSIS — J849 Interstitial pulmonary disease, unspecified: Secondary | ICD-10-CM | POA: Diagnosis not present

## 2020-07-15 DIAGNOSIS — J479 Bronchiectasis, uncomplicated: Secondary | ICD-10-CM | POA: Diagnosis not present

## 2020-07-15 DIAGNOSIS — I482 Chronic atrial fibrillation, unspecified: Secondary | ICD-10-CM | POA: Diagnosis not present

## 2020-07-19 DIAGNOSIS — I110 Hypertensive heart disease with heart failure: Secondary | ICD-10-CM | POA: Diagnosis not present

## 2020-07-19 DIAGNOSIS — I5020 Unspecified systolic (congestive) heart failure: Secondary | ICD-10-CM | POA: Diagnosis not present

## 2020-07-19 DIAGNOSIS — J479 Bronchiectasis, uncomplicated: Secondary | ICD-10-CM | POA: Diagnosis not present

## 2020-07-19 DIAGNOSIS — J849 Interstitial pulmonary disease, unspecified: Secondary | ICD-10-CM | POA: Diagnosis not present

## 2020-07-19 DIAGNOSIS — I482 Chronic atrial fibrillation, unspecified: Secondary | ICD-10-CM | POA: Diagnosis not present

## 2020-07-19 DIAGNOSIS — I5032 Chronic diastolic (congestive) heart failure: Secondary | ICD-10-CM | POA: Diagnosis not present

## 2020-07-22 DIAGNOSIS — I5020 Unspecified systolic (congestive) heart failure: Secondary | ICD-10-CM | POA: Diagnosis not present

## 2020-07-22 DIAGNOSIS — J849 Interstitial pulmonary disease, unspecified: Secondary | ICD-10-CM | POA: Diagnosis not present

## 2020-07-22 DIAGNOSIS — J479 Bronchiectasis, uncomplicated: Secondary | ICD-10-CM | POA: Diagnosis not present

## 2020-07-22 DIAGNOSIS — I5032 Chronic diastolic (congestive) heart failure: Secondary | ICD-10-CM | POA: Diagnosis not present

## 2020-07-22 DIAGNOSIS — I482 Chronic atrial fibrillation, unspecified: Secondary | ICD-10-CM | POA: Diagnosis not present

## 2020-07-22 DIAGNOSIS — I110 Hypertensive heart disease with heart failure: Secondary | ICD-10-CM | POA: Diagnosis not present

## 2020-07-25 DIAGNOSIS — I110 Hypertensive heart disease with heart failure: Secondary | ICD-10-CM | POA: Diagnosis not present

## 2020-07-25 DIAGNOSIS — I482 Chronic atrial fibrillation, unspecified: Secondary | ICD-10-CM | POA: Diagnosis not present

## 2020-07-25 DIAGNOSIS — J849 Interstitial pulmonary disease, unspecified: Secondary | ICD-10-CM | POA: Diagnosis not present

## 2020-07-25 DIAGNOSIS — J479 Bronchiectasis, uncomplicated: Secondary | ICD-10-CM | POA: Diagnosis not present

## 2020-07-25 DIAGNOSIS — I5032 Chronic diastolic (congestive) heart failure: Secondary | ICD-10-CM | POA: Diagnosis not present

## 2020-07-25 DIAGNOSIS — I5020 Unspecified systolic (congestive) heart failure: Secondary | ICD-10-CM | POA: Diagnosis not present

## 2020-07-28 ENCOUNTER — Encounter: Payer: Medicare Other | Admitting: Physical Medicine & Rehabilitation

## 2020-07-28 DIAGNOSIS — I482 Chronic atrial fibrillation, unspecified: Secondary | ICD-10-CM | POA: Diagnosis not present

## 2020-07-28 DIAGNOSIS — J849 Interstitial pulmonary disease, unspecified: Secondary | ICD-10-CM | POA: Diagnosis not present

## 2020-07-28 DIAGNOSIS — I5020 Unspecified systolic (congestive) heart failure: Secondary | ICD-10-CM | POA: Diagnosis not present

## 2020-07-28 DIAGNOSIS — J479 Bronchiectasis, uncomplicated: Secondary | ICD-10-CM | POA: Diagnosis not present

## 2020-07-28 DIAGNOSIS — I5032 Chronic diastolic (congestive) heart failure: Secondary | ICD-10-CM | POA: Diagnosis not present

## 2020-07-28 DIAGNOSIS — I110 Hypertensive heart disease with heart failure: Secondary | ICD-10-CM | POA: Diagnosis not present

## 2020-07-31 DIAGNOSIS — H409 Unspecified glaucoma: Secondary | ICD-10-CM | POA: Diagnosis not present

## 2020-07-31 DIAGNOSIS — D62 Acute posthemorrhagic anemia: Secondary | ICD-10-CM | POA: Diagnosis not present

## 2020-07-31 DIAGNOSIS — E440 Moderate protein-calorie malnutrition: Secondary | ICD-10-CM | POA: Diagnosis not present

## 2020-07-31 DIAGNOSIS — M5186 Other intervertebral disc disorders, lumbar region: Secondary | ICD-10-CM | POA: Diagnosis not present

## 2020-07-31 DIAGNOSIS — Z5181 Encounter for therapeutic drug level monitoring: Secondary | ICD-10-CM | POA: Diagnosis not present

## 2020-07-31 DIAGNOSIS — G629 Polyneuropathy, unspecified: Secondary | ICD-10-CM | POA: Diagnosis not present

## 2020-07-31 DIAGNOSIS — I110 Hypertensive heart disease with heart failure: Secondary | ICD-10-CM | POA: Diagnosis not present

## 2020-07-31 DIAGNOSIS — K449 Diaphragmatic hernia without obstruction or gangrene: Secondary | ICD-10-CM | POA: Diagnosis not present

## 2020-07-31 DIAGNOSIS — Z9981 Dependence on supplemental oxygen: Secondary | ICD-10-CM | POA: Diagnosis not present

## 2020-07-31 DIAGNOSIS — K220 Achalasia of cardia: Secondary | ICD-10-CM | POA: Diagnosis not present

## 2020-07-31 DIAGNOSIS — Z7901 Long term (current) use of anticoagulants: Secondary | ICD-10-CM | POA: Diagnosis not present

## 2020-07-31 DIAGNOSIS — Z7951 Long term (current) use of inhaled steroids: Secondary | ICD-10-CM | POA: Diagnosis not present

## 2020-07-31 DIAGNOSIS — E039 Hypothyroidism, unspecified: Secondary | ICD-10-CM | POA: Diagnosis not present

## 2020-07-31 DIAGNOSIS — I5020 Unspecified systolic (congestive) heart failure: Secondary | ICD-10-CM | POA: Diagnosis not present

## 2020-07-31 DIAGNOSIS — I482 Chronic atrial fibrillation, unspecified: Secondary | ICD-10-CM | POA: Diagnosis not present

## 2020-07-31 DIAGNOSIS — J479 Bronchiectasis, uncomplicated: Secondary | ICD-10-CM | POA: Diagnosis not present

## 2020-07-31 DIAGNOSIS — F419 Anxiety disorder, unspecified: Secondary | ICD-10-CM | POA: Diagnosis not present

## 2020-07-31 DIAGNOSIS — K219 Gastro-esophageal reflux disease without esophagitis: Secondary | ICD-10-CM | POA: Diagnosis not present

## 2020-07-31 DIAGNOSIS — Z8781 Personal history of (healed) traumatic fracture: Secondary | ICD-10-CM | POA: Diagnosis not present

## 2020-07-31 DIAGNOSIS — M1990 Unspecified osteoarthritis, unspecified site: Secondary | ICD-10-CM | POA: Diagnosis not present

## 2020-07-31 DIAGNOSIS — F32A Depression, unspecified: Secondary | ICD-10-CM | POA: Diagnosis not present

## 2020-07-31 DIAGNOSIS — E785 Hyperlipidemia, unspecified: Secondary | ICD-10-CM | POA: Diagnosis not present

## 2020-07-31 DIAGNOSIS — G8929 Other chronic pain: Secondary | ICD-10-CM | POA: Diagnosis not present

## 2020-07-31 DIAGNOSIS — J849 Interstitial pulmonary disease, unspecified: Secondary | ICD-10-CM | POA: Diagnosis not present

## 2020-07-31 DIAGNOSIS — I5032 Chronic diastolic (congestive) heart failure: Secondary | ICD-10-CM | POA: Diagnosis not present

## 2020-08-01 ENCOUNTER — Other Ambulatory Visit: Payer: Self-pay

## 2020-08-01 ENCOUNTER — Encounter: Payer: Medicare Other | Attending: Physical Medicine & Rehabilitation | Admitting: Physical Medicine & Rehabilitation

## 2020-08-01 ENCOUNTER — Encounter: Payer: Self-pay | Admitting: Physical Medicine & Rehabilitation

## 2020-08-01 ENCOUNTER — Ambulatory Visit (HOSPITAL_COMMUNITY)
Admission: RE | Admit: 2020-08-01 | Discharge: 2020-08-01 | Disposition: A | Discharge status: Home / Self Care | Payer: Medicare Other | Source: Ambulatory Visit | Attending: Physical Medicine & Rehabilitation | Admitting: Physical Medicine & Rehabilitation

## 2020-08-01 VITALS — BP 174/83 | HR 95 | Temp 98.0°F | Ht 66.5 in | Wt 164.2 lb

## 2020-08-01 DIAGNOSIS — G8929 Other chronic pain: Secondary | ICD-10-CM | POA: Diagnosis not present

## 2020-08-01 DIAGNOSIS — R600 Localized edema: Secondary | ICD-10-CM

## 2020-08-01 DIAGNOSIS — R269 Unspecified abnormalities of gait and mobility: Secondary | ICD-10-CM | POA: Diagnosis not present

## 2020-08-01 DIAGNOSIS — R609 Edema, unspecified: Secondary | ICD-10-CM

## 2020-08-01 DIAGNOSIS — G8918 Other acute postprocedural pain: Secondary | ICD-10-CM | POA: Diagnosis not present

## 2020-08-01 DIAGNOSIS — M25561 Pain in right knee: Secondary | ICD-10-CM | POA: Diagnosis not present

## 2020-08-01 NOTE — Progress Notes (Signed)
Subjective:    Patient ID: Stephanie Cortez, female    DOB: Nov 16, 1933, 87 y.o.   MRN: [id number]  HPI Right-handed female with history of atrial fibrillation maintained on Coumadin, interstitial lung disease maintained on oxygen, anxiety, diastolic congestive heart failure, COPD, hypertension, history of right hip fracture, colitis with diverticulitis presents for follow up for small bowel ischemia status post exploratory laparotomy lysis of adhesions small bowel resection gastrostomy tube placement 02/08/2020.  Last clinic visit on 06/20/20.  Daughter supplements history. Since that time, pt states she is still in therapies. She weaned her Tramadol. She has not seen Cards.  Denies falls. Voltaren gel is helping.   Pain Inventory Average Pain 6 Pain Right Now 6 My pain is constant, burning, tingling and aching  In the last 24 hours, has pain interfered with the following? General activity 6 Relation with others 0 Enjoyment of life 0 What TIME of day is your pain at its worst? morning , daytime and evening Sleep (in general) fair to poor  Pain is worse with: inactivity and unsure Pain improves with: rest, therapy/exercise, medication and heat Relief from Meds: Tylenol helps  walk with assistance use a walker how many minutes can you walk? more than 15-20 mins ability to climb steps?  yes do you drive?  no  retired I need assistance with the following:  dressing, bathing, toileting, meal prep, household duties and shopping Do you have any goals in this area?  yes  tingling trouble walking  Any changes since last visit?  no  Any changes since last visit?  no    Family History  Problem Relation Age of Onset  . Hypertension Mother   . Stroke Mother   . Emphysema Brother   . Other Father        miner's lung  . Cancer Father        Lung  . Colon polyps Sister   . Pancreatic cancer Sister   . Kidney disease Sister   . Atrial fibrillation Other        siblings    Social History   Socioeconomic History  . Marital status: Married    Spouse name: Not on file  . Number of children: 2  . Years of education: Not on file  . Highest education level: Not on file  Occupational History  . Occupation: retired Product manager: [employer]  Tobacco Use  . Smoking status: Never Smoker  . Smokeless tobacco: Never Used  Vaping Use  . Vaping Use: Never used  Substance and Sexual Activity  . Alcohol use: No  . Drug use: No  . Sexual activity: Not on file    Comment: Hysterectomy  Other Topics Concern  . Not on file  Social History Narrative   2 children 4 grandchildren   Recently moved to [place] from [place]   Retired Pharmacist, hospital   Grew up in [place]. Moved to [place]. Moved to [place] Feb 2010 due to duaghther living in [place]. Son lives in [place], [place]. Husband works as Scientist, physiological in [place].   Social Determinants of Health   Financial Resource Strain: Not on file  Food Insecurity: Not on file  Transportation Needs: Not on file  Physical Activity: Not on file  Stress: Not on file  Social Connections: Not on file   Past Surgical History:  Procedure Laterality Date  . BOWEL RESECTION N/A 02/08/2020   Procedure: SMALL BOWEL RESECTION;  Surgeon: Donnie Mesa, MD;  Location: [facility];  Service: General;  Laterality: N/A;  . BREAST SURGERY     br bx  . CARDIAC CATHETERIZATION  07/01/2013  . CATARACT EXTRACTION     both  . COLONOSCOPY    . ESOPHAGOSCOPY W/ BOTOX INJECTION  12/11/2011   Procedure: ESOPHAGOSCOPY WITH BOTOX INJECTION;  Surgeon: Rozetta Nunnery, MD;  Location: [facility];  Service: ENT;  Laterality: N/A;  esophogoscopy with dilation, botox injection  . FOOT SURGERY  03/14/2011   gastroc slide-rt  . GASTROSTOMY N/A 02/08/2020   Procedure: INSERTION OF GASTROSTOMY TUBE;  Surgeon: Donnie Mesa, MD;  Location: [facility];  Service: General;  Laterality: N/A;  . HIP ARTHROPLASTY Right 10/28/2019    Procedure: ARTHROPLASTY BIPOLAR HIP (HEMIARTHROPLASTY);  Surgeon: Marybelle Killings, MD;  Location[facility];  Service: Orthopedics;  Laterality: Right;  . LAPAROTOMY N/A 02/08/2020   Procedure: EXPLORATORY LAPAROTOMY;  Surgeon: Donnie Mesa, MD;  Location: [facility];  Service: General;  Laterality: N/A;  . LYSIS OF ADHESION N/A 02/08/2020   Procedure: LYSIS OF ADHESIONS;  Surgeon: Donnie Mesa, MD;  Location: [facility];  Service: General;  Laterality: N/A;  . TOTAL ABDOMINAL HYSTERECTOMY    . TOTAL HIP REVISION Right 11/16/2019   Procedure: TOTAL HIP REVISION BIPOLAR TO CEMENTED BIPOLAR;  Surgeon: Marybelle Killings, MD;  Location: [facility];  Service: Orthopedics;  Laterality: Right;  . WISDOM TOOTH EXTRACTION     Past Medical History:  Diagnosis Date  . Anemia    - Hgb 9.7gm% on 07/13/2008 in [place] -  Hgg 129gm% wiht normal irone levsl and ferritin 10/27/2008 in [place] Recurrent otitis/sinusitis  . Anxiety    chronic BZ prn  . Atrial fibrillation (HCC)    chronic anticoag  . Bronchiectasis    >PFT 07/13/2008 in [place] 1.9L/76%, FVC 2.45L/74, Ratio 79, TLC 121%, DLCO 64%  AE BRonchiectasis - Dec 2010.New Rx:  outpatient - Feb 2011 - Rx outpatient  . CHF (congestive heart failure) ([place])   . COPD (chronic obstructive pulmonary disease) (HCC)    bronchiectasis  . Cricopharyngeal achalasia   . Depression   . Diverticulosis   . Dyslipidemia   . Eczema   . GERD with stricture   . Glaucoma   . H. pylori infection   . Hypertension   . Hyponatremia    chronic, s/p endo eval 06/2012  . Hypothyroid   . IBS (irritable bowel syndrome)   . Lumbar disc disease   . Neuropathy of both feet   . Segmental colitis ([place])   . Vertigo   . Wears glasses    BP (!) 174/83   Pulse 95   Temp 98 F (36.7 C)   Wt 164 lb 3.2 oz (74.5 kg)   LMP  (LMP Unknown)   SpO2 97%   BMI 26.11 kg/m   Opioid Risk Score:   Fall Risk Score:  `1  Depression screen PHQ 2/9  Depression screen [employer] 2/9 08/01/2020 03/15/2020   Decreased Interest 0 0  Down, Depressed, Hopeless 0 0  PHQ - 2 Score 0 0  Altered sleeping - 0  Tired, decreased energy - 0  Change in appetite - 0  Feeling bad or failure about yourself  - 0  Trouble concentrating - 0  Moving slowly or fidgety/restless - 0  Suicidal thoughts - 0  PHQ-9 Score - 0  Some recent data might be hidden   Review of Systems  Constitutional: Negative.   HENT: Negative.  Eyes: Negative.   Respiratory: Negative.   Cardiovascular: Positive for leg swelling.  Gastrointestinal:       Feeding tub discomfort in the stomach  Endocrine: Negative.   Genitourinary: Negative.   Musculoskeletal: Positive for arthralgias, gait problem and myalgias.       Rest leg syndrome  Skin: Negative.        Itching all over the body  Allergic/Immunologic: Negative.   Neurological: Positive for weakness.       Tingling   Hematological: Bruises/bleeds easily.  Psychiatric/Behavioral: Negative.   All other systems reviewed and are negative.     Objective:   Physical Exam  Constitutional: No distress . Vital signs reviewed. HENT: Normocephalic.  Atraumatic. Eyes: EOMI. No discharge. Cardiovascular: No JVD.   Respiratory: Normal effort.  No stridor.   GI: Non-distended.   Skin: Warm and dry.  Varicose veins.  ? Lymphedema  Psych: Normal mood.  Normal behavior. Musc: LE edema LE Tenderness Hyperemia LE Right knee TTP Tightness in calves Neuro: Alert Motor: Bilateral upper extremities, left lower extremity: 5/5 proximal distally Right lower extremity: 4+/5 proximally, 5/5 distally, some pain inhibition, stable    Assessment & Plan:  Right-handed female with history of atrial fibrillation maintained on Coumadin, interstitial lung disease maintained on oxygen, anxiety, diastolic congestive heart failure, COPD, hypertension, history of right hip fracture with hemiarthroplasty, colitis with diverticulitis presents for follow-up for small bowel ischemia status post  exploratory laparotomy lysis of adhesions small bowel resection gastrostomy tube placement 02/08/2020.  1.    Debility secondary to small bowel ischemia status post exploratory laparotomy lysis of adhesions small bowel resection gastrostomy tube placement 02/08/2020  Continue therapies  2. Pain Management:   Continue prn tylenol   Weaned Tramadol  Trial TENS OTC  3.  Atrial fibrillation.    Continue meds  Referral made to Cards  4.  Constipation  Controlled with meds  5.  Gait abnormality  Continue therapies  Continue walker for safety  6. Right knee pain - exacerbated by right hip pain  Continue Voltaren gel   Will consider imagining if necessary  7. Peripheral edema - ?lymphedema  Follow up with Cards for eval for varicose veins/edema/hyperemia, referral made  Will order LE Dopplers  >30 minutes spent in total in answer questions and discussing etiology of medical concerns

## 2020-08-02 DIAGNOSIS — J849 Interstitial pulmonary disease, unspecified: Secondary | ICD-10-CM | POA: Diagnosis not present

## 2020-08-02 DIAGNOSIS — I482 Chronic atrial fibrillation, unspecified: Secondary | ICD-10-CM | POA: Diagnosis not present

## 2020-08-02 DIAGNOSIS — J479 Bronchiectasis, uncomplicated: Secondary | ICD-10-CM | POA: Diagnosis not present

## 2020-08-02 DIAGNOSIS — I5032 Chronic diastolic (congestive) heart failure: Secondary | ICD-10-CM | POA: Diagnosis not present

## 2020-08-02 DIAGNOSIS — I110 Hypertensive heart disease with heart failure: Secondary | ICD-10-CM | POA: Diagnosis not present

## 2020-08-02 DIAGNOSIS — I5020 Unspecified systolic (congestive) heart failure: Secondary | ICD-10-CM | POA: Diagnosis not present

## 2020-08-04 ENCOUNTER — Telehealth: Payer: Self-pay | Admitting: Internal Medicine

## 2020-08-04 NOTE — Telephone Encounter (Signed)
Pt c/o swelling: STAT is pt has developed SOB within 24 hours  1) How much weight have you gained and in what time span?  no  If swelling, where is the swelling located?both legs are swollen, sometimes they are red and pinkish, they are also real tight  2) Are you currently taking a fluid pill? yes  3) Are you currently SOB?  no  4) Do you have a log of your daily weights (if so, list)?no  5) Have you gained 3 pounds in a day or 5 pounds in a week?no- pt did have an ultrasound on Monday per Dr Posey Pronto- the ultrasound was good  6) Have you traveled recently? No- pt would like to be seen asap by Dr Rayann Heman or aPA

## 2020-08-04 NOTE — Telephone Encounter (Signed)
Spoke to the patient and her daughter about her peripheral edema. Advised them that a referral was made from Dr. Posey Pronto for this problem to Dr. Einar Gip. They said Dr. Einar Gip office has already called them and they understood he would help with this problem.  Patient and daughter verbalized understanding.

## 2020-08-08 DIAGNOSIS — I5020 Unspecified systolic (congestive) heart failure: Secondary | ICD-10-CM | POA: Diagnosis not present

## 2020-08-08 DIAGNOSIS — I5032 Chronic diastolic (congestive) heart failure: Secondary | ICD-10-CM | POA: Diagnosis not present

## 2020-08-08 DIAGNOSIS — I482 Chronic atrial fibrillation, unspecified: Secondary | ICD-10-CM | POA: Diagnosis not present

## 2020-08-08 DIAGNOSIS — J479 Bronchiectasis, uncomplicated: Secondary | ICD-10-CM | POA: Diagnosis not present

## 2020-08-08 DIAGNOSIS — I110 Hypertensive heart disease with heart failure: Secondary | ICD-10-CM | POA: Diagnosis not present

## 2020-08-08 DIAGNOSIS — J849 Interstitial pulmonary disease, unspecified: Secondary | ICD-10-CM | POA: Diagnosis not present

## 2020-08-09 DIAGNOSIS — J479 Bronchiectasis, uncomplicated: Secondary | ICD-10-CM | POA: Diagnosis not present

## 2020-08-09 DIAGNOSIS — I110 Hypertensive heart disease with heart failure: Secondary | ICD-10-CM | POA: Diagnosis not present

## 2020-08-09 DIAGNOSIS — I5020 Unspecified systolic (congestive) heart failure: Secondary | ICD-10-CM | POA: Diagnosis not present

## 2020-08-09 DIAGNOSIS — J849 Interstitial pulmonary disease, unspecified: Secondary | ICD-10-CM | POA: Diagnosis not present

## 2020-08-09 DIAGNOSIS — I482 Chronic atrial fibrillation, unspecified: Secondary | ICD-10-CM | POA: Diagnosis not present

## 2020-08-09 DIAGNOSIS — I5032 Chronic diastolic (congestive) heart failure: Secondary | ICD-10-CM | POA: Diagnosis not present

## 2020-08-15 DIAGNOSIS — I482 Chronic atrial fibrillation, unspecified: Secondary | ICD-10-CM | POA: Diagnosis not present

## 2020-08-15 DIAGNOSIS — I5032 Chronic diastolic (congestive) heart failure: Secondary | ICD-10-CM | POA: Diagnosis not present

## 2020-08-15 DIAGNOSIS — I110 Hypertensive heart disease with heart failure: Secondary | ICD-10-CM | POA: Diagnosis not present

## 2020-08-15 DIAGNOSIS — I5020 Unspecified systolic (congestive) heart failure: Secondary | ICD-10-CM | POA: Diagnosis not present

## 2020-08-15 DIAGNOSIS — J479 Bronchiectasis, uncomplicated: Secondary | ICD-10-CM | POA: Diagnosis not present

## 2020-08-15 DIAGNOSIS — J849 Interstitial pulmonary disease, unspecified: Secondary | ICD-10-CM | POA: Diagnosis not present

## 2020-08-16 DIAGNOSIS — J849 Interstitial pulmonary disease, unspecified: Secondary | ICD-10-CM | POA: Diagnosis not present

## 2020-08-16 DIAGNOSIS — I5020 Unspecified systolic (congestive) heart failure: Secondary | ICD-10-CM | POA: Diagnosis not present

## 2020-08-16 DIAGNOSIS — J479 Bronchiectasis, uncomplicated: Secondary | ICD-10-CM | POA: Diagnosis not present

## 2020-08-16 DIAGNOSIS — I482 Chronic atrial fibrillation, unspecified: Secondary | ICD-10-CM | POA: Diagnosis not present

## 2020-08-16 DIAGNOSIS — I110 Hypertensive heart disease with heart failure: Secondary | ICD-10-CM | POA: Diagnosis not present

## 2020-08-16 DIAGNOSIS — I5032 Chronic diastolic (congestive) heart failure: Secondary | ICD-10-CM | POA: Diagnosis not present

## 2020-08-22 DIAGNOSIS — M79661 Pain in right lower leg: Secondary | ICD-10-CM | POA: Diagnosis not present

## 2020-08-22 DIAGNOSIS — R609 Edema, unspecified: Secondary | ICD-10-CM | POA: Diagnosis not present

## 2020-08-22 DIAGNOSIS — E039 Hypothyroidism, unspecified: Secondary | ICD-10-CM | POA: Diagnosis not present

## 2020-08-22 DIAGNOSIS — F419 Anxiety disorder, unspecified: Secondary | ICD-10-CM | POA: Diagnosis not present

## 2020-08-22 DIAGNOSIS — Z7901 Long term (current) use of anticoagulants: Secondary | ICD-10-CM | POA: Diagnosis not present

## 2020-08-22 DIAGNOSIS — I482 Chronic atrial fibrillation, unspecified: Secondary | ICD-10-CM | POA: Diagnosis not present

## 2020-08-22 DIAGNOSIS — E785 Hyperlipidemia, unspecified: Secondary | ICD-10-CM | POA: Diagnosis not present

## 2020-08-22 DIAGNOSIS — M79662 Pain in left lower leg: Secondary | ICD-10-CM | POA: Diagnosis not present

## 2020-08-22 DIAGNOSIS — J849 Interstitial pulmonary disease, unspecified: Secondary | ICD-10-CM | POA: Diagnosis not present

## 2020-08-22 DIAGNOSIS — I10 Essential (primary) hypertension: Secondary | ICD-10-CM | POA: Diagnosis not present

## 2020-08-23 DIAGNOSIS — I110 Hypertensive heart disease with heart failure: Secondary | ICD-10-CM | POA: Diagnosis not present

## 2020-08-23 DIAGNOSIS — J479 Bronchiectasis, uncomplicated: Secondary | ICD-10-CM | POA: Diagnosis not present

## 2020-08-23 DIAGNOSIS — J849 Interstitial pulmonary disease, unspecified: Secondary | ICD-10-CM | POA: Diagnosis not present

## 2020-08-23 DIAGNOSIS — I5032 Chronic diastolic (congestive) heart failure: Secondary | ICD-10-CM | POA: Diagnosis not present

## 2020-08-23 DIAGNOSIS — I482 Chronic atrial fibrillation, unspecified: Secondary | ICD-10-CM | POA: Diagnosis not present

## 2020-08-23 DIAGNOSIS — I5020 Unspecified systolic (congestive) heart failure: Secondary | ICD-10-CM | POA: Diagnosis not present

## 2020-08-25 DIAGNOSIS — I110 Hypertensive heart disease with heart failure: Secondary | ICD-10-CM | POA: Diagnosis not present

## 2020-08-25 DIAGNOSIS — I5032 Chronic diastolic (congestive) heart failure: Secondary | ICD-10-CM | POA: Diagnosis not present

## 2020-08-25 DIAGNOSIS — J849 Interstitial pulmonary disease, unspecified: Secondary | ICD-10-CM | POA: Diagnosis not present

## 2020-08-25 DIAGNOSIS — I5020 Unspecified systolic (congestive) heart failure: Secondary | ICD-10-CM | POA: Diagnosis not present

## 2020-08-25 DIAGNOSIS — I482 Chronic atrial fibrillation, unspecified: Secondary | ICD-10-CM | POA: Diagnosis not present

## 2020-08-25 DIAGNOSIS — J479 Bronchiectasis, uncomplicated: Secondary | ICD-10-CM | POA: Diagnosis not present

## 2020-08-26 DIAGNOSIS — J849 Interstitial pulmonary disease, unspecified: Secondary | ICD-10-CM | POA: Diagnosis not present

## 2020-08-26 DIAGNOSIS — J479 Bronchiectasis, uncomplicated: Secondary | ICD-10-CM | POA: Diagnosis not present

## 2020-08-26 DIAGNOSIS — I110 Hypertensive heart disease with heart failure: Secondary | ICD-10-CM | POA: Diagnosis not present

## 2020-08-26 DIAGNOSIS — I482 Chronic atrial fibrillation, unspecified: Secondary | ICD-10-CM | POA: Diagnosis not present

## 2020-08-26 DIAGNOSIS — I5020 Unspecified systolic (congestive) heart failure: Secondary | ICD-10-CM | POA: Diagnosis not present

## 2020-08-26 DIAGNOSIS — I5032 Chronic diastolic (congestive) heart failure: Secondary | ICD-10-CM | POA: Diagnosis not present

## 2020-08-26 DIAGNOSIS — D649 Anemia, unspecified: Secondary | ICD-10-CM | POA: Diagnosis not present

## 2020-08-29 DIAGNOSIS — I5020 Unspecified systolic (congestive) heart failure: Secondary | ICD-10-CM | POA: Diagnosis not present

## 2020-08-29 DIAGNOSIS — J479 Bronchiectasis, uncomplicated: Secondary | ICD-10-CM | POA: Diagnosis not present

## 2020-08-29 DIAGNOSIS — I482 Chronic atrial fibrillation, unspecified: Secondary | ICD-10-CM | POA: Diagnosis not present

## 2020-08-29 DIAGNOSIS — I5032 Chronic diastolic (congestive) heart failure: Secondary | ICD-10-CM | POA: Diagnosis not present

## 2020-08-29 DIAGNOSIS — I110 Hypertensive heart disease with heart failure: Secondary | ICD-10-CM | POA: Diagnosis not present

## 2020-08-29 DIAGNOSIS — J849 Interstitial pulmonary disease, unspecified: Secondary | ICD-10-CM | POA: Diagnosis not present

## 2020-08-30 DIAGNOSIS — M5186 Other intervertebral disc disorders, lumbar region: Secondary | ICD-10-CM | POA: Diagnosis not present

## 2020-08-30 DIAGNOSIS — K449 Diaphragmatic hernia without obstruction or gangrene: Secondary | ICD-10-CM | POA: Diagnosis not present

## 2020-08-30 DIAGNOSIS — I5032 Chronic diastolic (congestive) heart failure: Secondary | ICD-10-CM | POA: Diagnosis not present

## 2020-08-30 DIAGNOSIS — D62 Acute posthemorrhagic anemia: Secondary | ICD-10-CM | POA: Diagnosis not present

## 2020-08-30 DIAGNOSIS — E039 Hypothyroidism, unspecified: Secondary | ICD-10-CM | POA: Diagnosis not present

## 2020-08-30 DIAGNOSIS — E440 Moderate protein-calorie malnutrition: Secondary | ICD-10-CM | POA: Diagnosis not present

## 2020-08-30 DIAGNOSIS — M1990 Unspecified osteoarthritis, unspecified site: Secondary | ICD-10-CM | POA: Diagnosis not present

## 2020-08-30 DIAGNOSIS — Z5181 Encounter for therapeutic drug level monitoring: Secondary | ICD-10-CM | POA: Diagnosis not present

## 2020-08-30 DIAGNOSIS — F419 Anxiety disorder, unspecified: Secondary | ICD-10-CM | POA: Diagnosis not present

## 2020-08-30 DIAGNOSIS — I482 Chronic atrial fibrillation, unspecified: Secondary | ICD-10-CM | POA: Diagnosis not present

## 2020-08-30 DIAGNOSIS — Z7951 Long term (current) use of inhaled steroids: Secondary | ICD-10-CM | POA: Diagnosis not present

## 2020-08-30 DIAGNOSIS — J849 Interstitial pulmonary disease, unspecified: Secondary | ICD-10-CM | POA: Diagnosis not present

## 2020-08-30 DIAGNOSIS — Z9181 History of falling: Secondary | ICD-10-CM | POA: Diagnosis not present

## 2020-08-30 DIAGNOSIS — I5020 Unspecified systolic (congestive) heart failure: Secondary | ICD-10-CM | POA: Diagnosis not present

## 2020-08-30 DIAGNOSIS — K219 Gastro-esophageal reflux disease without esophagitis: Secondary | ICD-10-CM | POA: Diagnosis not present

## 2020-08-30 DIAGNOSIS — E785 Hyperlipidemia, unspecified: Secondary | ICD-10-CM | POA: Diagnosis not present

## 2020-08-30 DIAGNOSIS — Z8781 Personal history of (healed) traumatic fracture: Secondary | ICD-10-CM | POA: Diagnosis not present

## 2020-08-30 DIAGNOSIS — H409 Unspecified glaucoma: Secondary | ICD-10-CM | POA: Diagnosis not present

## 2020-08-30 DIAGNOSIS — G629 Polyneuropathy, unspecified: Secondary | ICD-10-CM | POA: Diagnosis not present

## 2020-08-30 DIAGNOSIS — I110 Hypertensive heart disease with heart failure: Secondary | ICD-10-CM | POA: Diagnosis not present

## 2020-08-30 DIAGNOSIS — K220 Achalasia of cardia: Secondary | ICD-10-CM | POA: Diagnosis not present

## 2020-08-30 DIAGNOSIS — F32A Depression, unspecified: Secondary | ICD-10-CM | POA: Diagnosis not present

## 2020-08-30 DIAGNOSIS — J479 Bronchiectasis, uncomplicated: Secondary | ICD-10-CM | POA: Diagnosis not present

## 2020-08-30 DIAGNOSIS — G8929 Other chronic pain: Secondary | ICD-10-CM | POA: Diagnosis not present

## 2020-08-30 DIAGNOSIS — Z7901 Long term (current) use of anticoagulants: Secondary | ICD-10-CM | POA: Diagnosis not present

## 2020-08-31 ENCOUNTER — Telehealth: Payer: Self-pay | Admitting: Internal Medicine

## 2020-08-31 NOTE — Telephone Encounter (Signed)
Spoke to patient and daughter.  Patient has not been seen in greater than a year.  Did have evaluation of their basement has the assessment that they will bring to next visit.  Was made an appointment to follow-up with Dr. Chase Caller on April 4 Has had a very stressful year with a hip fracture requiring 2 surgeries and then another admission for bowel blockage.  Patient is now back home and wants to have a follow-up visit.  Patient is also having ongoing rash issues.  She is recently been seen by primary care provider and rashes not getting better.  Have recommended that she contact primary care again to follow-up on her rash.  Nothing further needed.  Appointment was made.  Will send to Dr. Chase Caller for [facility]

## 2020-09-01 DIAGNOSIS — J849 Interstitial pulmonary disease, unspecified: Secondary | ICD-10-CM | POA: Diagnosis not present

## 2020-09-01 DIAGNOSIS — I5020 Unspecified systolic (congestive) heart failure: Secondary | ICD-10-CM | POA: Diagnosis not present

## 2020-09-01 DIAGNOSIS — J479 Bronchiectasis, uncomplicated: Secondary | ICD-10-CM | POA: Diagnosis not present

## 2020-09-01 DIAGNOSIS — I482 Chronic atrial fibrillation, unspecified: Secondary | ICD-10-CM | POA: Diagnosis not present

## 2020-09-01 DIAGNOSIS — I110 Hypertensive heart disease with heart failure: Secondary | ICD-10-CM | POA: Diagnosis not present

## 2020-09-01 DIAGNOSIS — I5032 Chronic diastolic (congestive) heart failure: Secondary | ICD-10-CM | POA: Diagnosis not present

## 2020-09-02 DIAGNOSIS — I110 Hypertensive heart disease with heart failure: Secondary | ICD-10-CM | POA: Diagnosis not present

## 2020-09-02 DIAGNOSIS — J849 Interstitial pulmonary disease, unspecified: Secondary | ICD-10-CM | POA: Diagnosis not present

## 2020-09-02 DIAGNOSIS — J479 Bronchiectasis, uncomplicated: Secondary | ICD-10-CM | POA: Diagnosis not present

## 2020-09-02 DIAGNOSIS — I5020 Unspecified systolic (congestive) heart failure: Secondary | ICD-10-CM | POA: Diagnosis not present

## 2020-09-02 DIAGNOSIS — I482 Chronic atrial fibrillation, unspecified: Secondary | ICD-10-CM | POA: Diagnosis not present

## 2020-09-02 DIAGNOSIS — I5032 Chronic diastolic (congestive) heart failure: Secondary | ICD-10-CM | POA: Diagnosis not present

## 2020-09-05 DIAGNOSIS — J479 Bronchiectasis, uncomplicated: Secondary | ICD-10-CM | POA: Diagnosis not present

## 2020-09-05 DIAGNOSIS — I110 Hypertensive heart disease with heart failure: Secondary | ICD-10-CM | POA: Diagnosis not present

## 2020-09-05 DIAGNOSIS — D649 Anemia, unspecified: Secondary | ICD-10-CM | POA: Diagnosis not present

## 2020-09-05 DIAGNOSIS — J849 Interstitial pulmonary disease, unspecified: Secondary | ICD-10-CM | POA: Diagnosis not present

## 2020-09-05 DIAGNOSIS — I5020 Unspecified systolic (congestive) heart failure: Secondary | ICD-10-CM | POA: Diagnosis not present

## 2020-09-05 DIAGNOSIS — I482 Chronic atrial fibrillation, unspecified: Secondary | ICD-10-CM | POA: Diagnosis not present

## 2020-09-05 DIAGNOSIS — I5032 Chronic diastolic (congestive) heart failure: Secondary | ICD-10-CM | POA: Diagnosis not present

## 2020-09-06 DIAGNOSIS — I5020 Unspecified systolic (congestive) heart failure: Secondary | ICD-10-CM | POA: Diagnosis not present

## 2020-09-06 DIAGNOSIS — I5032 Chronic diastolic (congestive) heart failure: Secondary | ICD-10-CM | POA: Diagnosis not present

## 2020-09-06 DIAGNOSIS — J849 Interstitial pulmonary disease, unspecified: Secondary | ICD-10-CM | POA: Diagnosis not present

## 2020-09-06 DIAGNOSIS — I110 Hypertensive heart disease with heart failure: Secondary | ICD-10-CM | POA: Diagnosis not present

## 2020-09-06 DIAGNOSIS — J479 Bronchiectasis, uncomplicated: Secondary | ICD-10-CM | POA: Diagnosis not present

## 2020-09-06 DIAGNOSIS — I482 Chronic atrial fibrillation, unspecified: Secondary | ICD-10-CM | POA: Diagnosis not present

## 2020-09-07 DIAGNOSIS — I110 Hypertensive heart disease with heart failure: Secondary | ICD-10-CM | POA: Diagnosis not present

## 2020-09-07 DIAGNOSIS — J849 Interstitial pulmonary disease, unspecified: Secondary | ICD-10-CM | POA: Diagnosis not present

## 2020-09-07 DIAGNOSIS — R609 Edema, unspecified: Secondary | ICD-10-CM | POA: Diagnosis not present

## 2020-09-07 DIAGNOSIS — S81802D Unspecified open wound, left lower leg, subsequent encounter: Secondary | ICD-10-CM | POA: Diagnosis not present

## 2020-09-07 DIAGNOSIS — L299 Pruritus, unspecified: Secondary | ICD-10-CM | POA: Diagnosis not present

## 2020-09-07 DIAGNOSIS — I5020 Unspecified systolic (congestive) heart failure: Secondary | ICD-10-CM | POA: Diagnosis not present

## 2020-09-07 DIAGNOSIS — I482 Chronic atrial fibrillation, unspecified: Secondary | ICD-10-CM | POA: Diagnosis not present

## 2020-09-07 DIAGNOSIS — J479 Bronchiectasis, uncomplicated: Secondary | ICD-10-CM | POA: Diagnosis not present

## 2020-09-07 DIAGNOSIS — I5032 Chronic diastolic (congestive) heart failure: Secondary | ICD-10-CM | POA: Diagnosis not present

## 2020-09-09 DIAGNOSIS — I482 Chronic atrial fibrillation, unspecified: Secondary | ICD-10-CM | POA: Diagnosis not present

## 2020-09-09 DIAGNOSIS — I5020 Unspecified systolic (congestive) heart failure: Secondary | ICD-10-CM | POA: Diagnosis not present

## 2020-09-09 DIAGNOSIS — J479 Bronchiectasis, uncomplicated: Secondary | ICD-10-CM | POA: Diagnosis not present

## 2020-09-09 DIAGNOSIS — I110 Hypertensive heart disease with heart failure: Secondary | ICD-10-CM | POA: Diagnosis not present

## 2020-09-09 DIAGNOSIS — J849 Interstitial pulmonary disease, unspecified: Secondary | ICD-10-CM | POA: Diagnosis not present

## 2020-09-09 DIAGNOSIS — I5032 Chronic diastolic (congestive) heart failure: Secondary | ICD-10-CM | POA: Diagnosis not present

## 2020-09-12 DIAGNOSIS — I110 Hypertensive heart disease with heart failure: Secondary | ICD-10-CM | POA: Diagnosis not present

## 2020-09-12 DIAGNOSIS — I5032 Chronic diastolic (congestive) heart failure: Secondary | ICD-10-CM | POA: Diagnosis not present

## 2020-09-12 DIAGNOSIS — J849 Interstitial pulmonary disease, unspecified: Secondary | ICD-10-CM | POA: Diagnosis not present

## 2020-09-12 DIAGNOSIS — I5020 Unspecified systolic (congestive) heart failure: Secondary | ICD-10-CM | POA: Diagnosis not present

## 2020-09-12 DIAGNOSIS — J479 Bronchiectasis, uncomplicated: Secondary | ICD-10-CM | POA: Diagnosis not present

## 2020-09-12 DIAGNOSIS — I482 Chronic atrial fibrillation, unspecified: Secondary | ICD-10-CM | POA: Diagnosis not present

## 2020-09-13 ENCOUNTER — Encounter: Payer: Medicare Other | Admitting: Physical Medicine & Rehabilitation

## 2020-09-13 DIAGNOSIS — J479 Bronchiectasis, uncomplicated: Secondary | ICD-10-CM | POA: Diagnosis not present

## 2020-09-13 DIAGNOSIS — I5020 Unspecified systolic (congestive) heart failure: Secondary | ICD-10-CM | POA: Diagnosis not present

## 2020-09-13 DIAGNOSIS — J849 Interstitial pulmonary disease, unspecified: Secondary | ICD-10-CM | POA: Diagnosis not present

## 2020-09-13 DIAGNOSIS — I110 Hypertensive heart disease with heart failure: Secondary | ICD-10-CM | POA: Diagnosis not present

## 2020-09-13 DIAGNOSIS — I482 Chronic atrial fibrillation, unspecified: Secondary | ICD-10-CM | POA: Diagnosis not present

## 2020-09-13 DIAGNOSIS — I5032 Chronic diastolic (congestive) heart failure: Secondary | ICD-10-CM | POA: Diagnosis not present

## 2020-09-15 DIAGNOSIS — I5032 Chronic diastolic (congestive) heart failure: Secondary | ICD-10-CM | POA: Diagnosis not present

## 2020-09-15 DIAGNOSIS — I110 Hypertensive heart disease with heart failure: Secondary | ICD-10-CM | POA: Diagnosis not present

## 2020-09-15 DIAGNOSIS — J849 Interstitial pulmonary disease, unspecified: Secondary | ICD-10-CM | POA: Diagnosis not present

## 2020-09-15 DIAGNOSIS — J479 Bronchiectasis, uncomplicated: Secondary | ICD-10-CM | POA: Diagnosis not present

## 2020-09-15 DIAGNOSIS — I5020 Unspecified systolic (congestive) heart failure: Secondary | ICD-10-CM | POA: Diagnosis not present

## 2020-09-15 DIAGNOSIS — I482 Chronic atrial fibrillation, unspecified: Secondary | ICD-10-CM | POA: Diagnosis not present

## 2020-09-16 DIAGNOSIS — J479 Bronchiectasis, uncomplicated: Secondary | ICD-10-CM | POA: Diagnosis not present

## 2020-09-16 DIAGNOSIS — I110 Hypertensive heart disease with heart failure: Secondary | ICD-10-CM | POA: Diagnosis not present

## 2020-09-16 DIAGNOSIS — I5032 Chronic diastolic (congestive) heart failure: Secondary | ICD-10-CM | POA: Diagnosis not present

## 2020-09-16 DIAGNOSIS — J849 Interstitial pulmonary disease, unspecified: Secondary | ICD-10-CM | POA: Diagnosis not present

## 2020-09-16 DIAGNOSIS — I5020 Unspecified systolic (congestive) heart failure: Secondary | ICD-10-CM | POA: Diagnosis not present

## 2020-09-16 DIAGNOSIS — I482 Chronic atrial fibrillation, unspecified: Secondary | ICD-10-CM | POA: Diagnosis not present

## 2020-09-19 ENCOUNTER — Telehealth: Payer: Self-pay | Admitting: Internal Medicine

## 2020-09-19 DIAGNOSIS — I5032 Chronic diastolic (congestive) heart failure: Secondary | ICD-10-CM | POA: Diagnosis not present

## 2020-09-19 DIAGNOSIS — I5020 Unspecified systolic (congestive) heart failure: Secondary | ICD-10-CM | POA: Diagnosis not present

## 2020-09-19 DIAGNOSIS — I110 Hypertensive heart disease with heart failure: Secondary | ICD-10-CM | POA: Diagnosis not present

## 2020-09-19 DIAGNOSIS — I482 Chronic atrial fibrillation, unspecified: Secondary | ICD-10-CM | POA: Diagnosis not present

## 2020-09-19 DIAGNOSIS — J849 Interstitial pulmonary disease, unspecified: Secondary | ICD-10-CM | POA: Diagnosis not present

## 2020-09-19 DIAGNOSIS — J479 Bronchiectasis, uncomplicated: Secondary | ICD-10-CM | POA: Diagnosis not present

## 2020-09-19 NOTE — Telephone Encounter (Signed)
[name], Daughter of the patient called. She was with the patient on speaker with the patient during the call. The Daughter wanted to know if the patient could switch from warfarin to eliquis.  PCP encouraged the patient to switch sooner rather than later.  Please let the daughter know what Dr. Caryl Comes recommends

## 2020-09-19 NOTE — Telephone Encounter (Signed)
Pt's daughter returning a phone call. Pt's daughter can be reached at [phone].

## 2020-09-19 NOTE — Telephone Encounter (Signed)
Called and spoke to patient, who is requesting a order to be placed to [facility] to d/c nighttime ooxygen. Patient last wore oxygen June of 2021.  MR, please advise. thanks

## 2020-09-19 NOTE — Telephone Encounter (Signed)
Pt is about to leave out and the best number to be reached is [phone].

## 2020-09-19 NOTE — Telephone Encounter (Signed)
Lm for patient.  

## 2020-09-20 NOTE — Telephone Encounter (Signed)
Spoke with daughter ok per DPR  She is aware of response per MR  She is going to speak with pt and make sure okay with proceeding with the ONO and will call us back  Will place order to proceed

## 2020-09-20 NOTE — Telephone Encounter (Signed)
She has ILD and I Am worried that she might be desaturating at night.  Plan  - pls ask her if is ok with her -> we test for ONO on room air > and then we can decide about dc o2

## 2020-09-21 NOTE — Telephone Encounter (Signed)
Spoke with pt's daughter, [name],DPR  And advised per Dr Caryl Comes usually pt's are transitioned to Eliquis; however, it would be best for pt to see Dr Rayann Heman on 10/03/2020 to discuss anticoagulation.  Daughter states pt is also being referred to Dr Margaretann Loveless by PCP.  Pt's daughter verbalizes understanding and thanked Therapist, sports for the call.

## 2020-09-22 DIAGNOSIS — I482 Chronic atrial fibrillation, unspecified: Secondary | ICD-10-CM | POA: Diagnosis not present

## 2020-09-22 DIAGNOSIS — I5032 Chronic diastolic (congestive) heart failure: Secondary | ICD-10-CM | POA: Diagnosis not present

## 2020-09-22 DIAGNOSIS — J849 Interstitial pulmonary disease, unspecified: Secondary | ICD-10-CM | POA: Diagnosis not present

## 2020-09-22 DIAGNOSIS — I5020 Unspecified systolic (congestive) heart failure: Secondary | ICD-10-CM | POA: Diagnosis not present

## 2020-09-22 DIAGNOSIS — J479 Bronchiectasis, uncomplicated: Secondary | ICD-10-CM | POA: Diagnosis not present

## 2020-09-22 DIAGNOSIS — I110 Hypertensive heart disease with heart failure: Secondary | ICD-10-CM | POA: Diagnosis not present

## 2020-09-26 DIAGNOSIS — I5020 Unspecified systolic (congestive) heart failure: Secondary | ICD-10-CM | POA: Diagnosis not present

## 2020-09-26 DIAGNOSIS — I482 Chronic atrial fibrillation, unspecified: Secondary | ICD-10-CM | POA: Diagnosis not present

## 2020-09-26 DIAGNOSIS — J479 Bronchiectasis, uncomplicated: Secondary | ICD-10-CM | POA: Diagnosis not present

## 2020-09-26 DIAGNOSIS — I110 Hypertensive heart disease with heart failure: Secondary | ICD-10-CM | POA: Diagnosis not present

## 2020-09-26 DIAGNOSIS — I5032 Chronic diastolic (congestive) heart failure: Secondary | ICD-10-CM | POA: Diagnosis not present

## 2020-09-26 DIAGNOSIS — J849 Interstitial pulmonary disease, unspecified: Secondary | ICD-10-CM | POA: Diagnosis not present

## 2020-09-27 DIAGNOSIS — I482 Chronic atrial fibrillation, unspecified: Secondary | ICD-10-CM | POA: Diagnosis not present

## 2020-09-27 DIAGNOSIS — J479 Bronchiectasis, uncomplicated: Secondary | ICD-10-CM | POA: Diagnosis not present

## 2020-09-27 DIAGNOSIS — J849 Interstitial pulmonary disease, unspecified: Secondary | ICD-10-CM | POA: Diagnosis not present

## 2020-09-27 DIAGNOSIS — I5020 Unspecified systolic (congestive) heart failure: Secondary | ICD-10-CM | POA: Diagnosis not present

## 2020-09-27 DIAGNOSIS — I110 Hypertensive heart disease with heart failure: Secondary | ICD-10-CM | POA: Diagnosis not present

## 2020-09-27 DIAGNOSIS — I5032 Chronic diastolic (congestive) heart failure: Secondary | ICD-10-CM | POA: Diagnosis not present

## 2020-09-27 NOTE — Telephone Encounter (Signed)
Called and spoke with pt and daughter Stephanie Cortez about the info stated by MR. Per Stephanie Cortez, after speaking with pt, they said that they will hold onto the O2 and regroup with MR at upcoming OV. Nothing further needed.

## 2020-09-27 NOTE — Telephone Encounter (Signed)
lmtcb for [name].

## 2020-09-29 DIAGNOSIS — Z9181 History of falling: Secondary | ICD-10-CM | POA: Diagnosis not present

## 2020-09-29 DIAGNOSIS — F32A Depression, unspecified: Secondary | ICD-10-CM | POA: Diagnosis not present

## 2020-09-29 DIAGNOSIS — F419 Anxiety disorder, unspecified: Secondary | ICD-10-CM | POA: Diagnosis not present

## 2020-09-29 DIAGNOSIS — Z5181 Encounter for therapeutic drug level monitoring: Secondary | ICD-10-CM | POA: Diagnosis not present

## 2020-09-29 DIAGNOSIS — M5186 Other intervertebral disc disorders, lumbar region: Secondary | ICD-10-CM | POA: Diagnosis not present

## 2020-09-29 DIAGNOSIS — K219 Gastro-esophageal reflux disease without esophagitis: Secondary | ICD-10-CM | POA: Diagnosis not present

## 2020-09-29 DIAGNOSIS — I482 Chronic atrial fibrillation, unspecified: Secondary | ICD-10-CM | POA: Diagnosis not present

## 2020-09-29 DIAGNOSIS — Z7901 Long term (current) use of anticoagulants: Secondary | ICD-10-CM | POA: Diagnosis not present

## 2020-09-29 DIAGNOSIS — G629 Polyneuropathy, unspecified: Secondary | ICD-10-CM | POA: Diagnosis not present

## 2020-09-29 DIAGNOSIS — I5032 Chronic diastolic (congestive) heart failure: Secondary | ICD-10-CM | POA: Diagnosis not present

## 2020-09-29 DIAGNOSIS — K220 Achalasia of cardia: Secondary | ICD-10-CM | POA: Diagnosis not present

## 2020-09-29 DIAGNOSIS — E039 Hypothyroidism, unspecified: Secondary | ICD-10-CM | POA: Diagnosis not present

## 2020-09-29 DIAGNOSIS — D62 Acute posthemorrhagic anemia: Secondary | ICD-10-CM | POA: Diagnosis not present

## 2020-09-29 DIAGNOSIS — J849 Interstitial pulmonary disease, unspecified: Secondary | ICD-10-CM | POA: Diagnosis not present

## 2020-09-29 DIAGNOSIS — M1990 Unspecified osteoarthritis, unspecified site: Secondary | ICD-10-CM | POA: Diagnosis not present

## 2020-09-29 DIAGNOSIS — I5020 Unspecified systolic (congestive) heart failure: Secondary | ICD-10-CM | POA: Diagnosis not present

## 2020-09-29 DIAGNOSIS — H409 Unspecified glaucoma: Secondary | ICD-10-CM | POA: Diagnosis not present

## 2020-09-29 DIAGNOSIS — I110 Hypertensive heart disease with heart failure: Secondary | ICD-10-CM | POA: Diagnosis not present

## 2020-09-29 DIAGNOSIS — K449 Diaphragmatic hernia without obstruction or gangrene: Secondary | ICD-10-CM | POA: Diagnosis not present

## 2020-09-29 DIAGNOSIS — G8929 Other chronic pain: Secondary | ICD-10-CM | POA: Diagnosis not present

## 2020-09-29 DIAGNOSIS — E785 Hyperlipidemia, unspecified: Secondary | ICD-10-CM | POA: Diagnosis not present

## 2020-09-29 DIAGNOSIS — E440 Moderate protein-calorie malnutrition: Secondary | ICD-10-CM | POA: Diagnosis not present

## 2020-09-29 DIAGNOSIS — Z8781 Personal history of (healed) traumatic fracture: Secondary | ICD-10-CM | POA: Diagnosis not present

## 2020-09-29 DIAGNOSIS — Z7951 Long term (current) use of inhaled steroids: Secondary | ICD-10-CM | POA: Diagnosis not present

## 2020-09-29 DIAGNOSIS — J479 Bronchiectasis, uncomplicated: Secondary | ICD-10-CM | POA: Diagnosis not present

## 2020-09-30 DIAGNOSIS — I5020 Unspecified systolic (congestive) heart failure: Secondary | ICD-10-CM | POA: Diagnosis not present

## 2020-09-30 DIAGNOSIS — I482 Chronic atrial fibrillation, unspecified: Secondary | ICD-10-CM | POA: Diagnosis not present

## 2020-09-30 DIAGNOSIS — N390 Urinary tract infection, site not specified: Secondary | ICD-10-CM | POA: Diagnosis not present

## 2020-09-30 DIAGNOSIS — J849 Interstitial pulmonary disease, unspecified: Secondary | ICD-10-CM | POA: Diagnosis not present

## 2020-09-30 DIAGNOSIS — I5032 Chronic diastolic (congestive) heart failure: Secondary | ICD-10-CM | POA: Diagnosis not present

## 2020-09-30 DIAGNOSIS — J479 Bronchiectasis, uncomplicated: Secondary | ICD-10-CM | POA: Diagnosis not present

## 2020-09-30 DIAGNOSIS — I110 Hypertensive heart disease with heart failure: Secondary | ICD-10-CM | POA: Diagnosis not present

## 2020-10-03 ENCOUNTER — Ambulatory Visit: Payer: Medicare Other | Admitting: Internal Medicine

## 2020-10-03 ENCOUNTER — Encounter: Payer: Self-pay | Admitting: Internal Medicine

## 2020-10-03 ENCOUNTER — Other Ambulatory Visit: Payer: Self-pay

## 2020-10-03 ENCOUNTER — Ambulatory Visit (INDEPENDENT_AMBULATORY_CARE_PROVIDER_SITE_OTHER): Payer: Medicare Other | Admitting: Internal Medicine

## 2020-10-03 VITALS — BP 124/66 | HR 93 | Ht 60.65 in | Wt 156.6 lb

## 2020-10-03 DIAGNOSIS — J849 Interstitial pulmonary disease, unspecified: Secondary | ICD-10-CM | POA: Diagnosis not present

## 2020-10-03 DIAGNOSIS — I4821 Permanent atrial fibrillation: Secondary | ICD-10-CM

## 2020-10-03 DIAGNOSIS — I482 Chronic atrial fibrillation, unspecified: Secondary | ICD-10-CM | POA: Diagnosis not present

## 2020-10-03 DIAGNOSIS — I5020 Unspecified systolic (congestive) heart failure: Secondary | ICD-10-CM | POA: Diagnosis not present

## 2020-10-03 DIAGNOSIS — I110 Hypertensive heart disease with heart failure: Secondary | ICD-10-CM | POA: Diagnosis not present

## 2020-10-03 DIAGNOSIS — I5032 Chronic diastolic (congestive) heart failure: Secondary | ICD-10-CM | POA: Diagnosis not present

## 2020-10-03 DIAGNOSIS — I509 Heart failure, unspecified: Secondary | ICD-10-CM | POA: Diagnosis not present

## 2020-10-03 DIAGNOSIS — J479 Bronchiectasis, uncomplicated: Secondary | ICD-10-CM | POA: Diagnosis not present

## 2020-10-03 MED ORDER — APIXABAN 2.5 MG PO TABS: 2.5000 mg | ORAL_TABLET | Freq: Two times a day (BID) | ORAL | 3 refills | Status: DC

## 2020-10-03 NOTE — Progress Notes (Signed)
PCP: [name], MD Primary Cardiologist: previously Dr Einar Gip Primary EP: previously Dr Jiles Prows is a 87 y.o. female who presents today for routine electrophysiology followup.  I have not seen her previously.  She has permanent afib. She is accompanied by her daughter (care giver) today. Her primary concern is with edema.  Her legs have been wrapped.    Today, she denies symptoms of palpitations, chest pain, shortness of breath, dizziness, presyncope, or syncope.  The patient is otherwise without complaint today.   Past Medical History:  Diagnosis Date  . Anemia    - Hgb 9.7gm% on 07/13/2008 in [place] -  Hgg 129gm% wiht normal irone levsl and ferritin 10/27/2008 in [place] Recurrent otitis/sinusitis  . Anxiety    chronic BZ prn  . Atrial fibrillation (HCC)    chronic anticoag  . Bronchiectasis    >PFT 07/13/2008 in [facility] 1.9L/76%, FVC 2.45L/74, Ratio 79, TLC 121%, DLCO 64%  AE BRonchiectasis - Dec 2010.New Rx:  outpatient - Feb 2011 - Rx outpatient  . CHF (congestive heart failure) ([place])   . COPD (chronic obstructive pulmonary disease) (HCC)    bronchiectasis  . Cricopharyngeal achalasia   . Depression   . Diverticulosis   . Dyslipidemia   . Eczema   . GERD with stricture   . Glaucoma   . H. pylori infection   . Hypertension   . Hyponatremia    chronic, s/p endo eval 06/2012  . Hypothyroid   . IBS (irritable bowel syndrome)   . Lumbar disc disease   . Neuropathy of both feet   . Segmental colitis ([place])   . Vertigo   . Wears glasses    Past Surgical History:  Procedure Laterality Date  . BOWEL RESECTION N/A 02/08/2020   Procedure: SMALL BOWEL RESECTION;  Surgeon: Donnie Mesa, MD;  Location: [facility];  Service: General;  Laterality: N/A;  . BREAST SURGERY     br bx  . CARDIAC CATHETERIZATION  07/01/2013  . CATARACT EXTRACTION     both  . COLONOSCOPY    . ESOPHAGOSCOPY W/ BOTOX INJECTION  12/11/2011   Procedure: ESOPHAGOSCOPY WITH BOTOX INJECTION;   Surgeon: Rozetta Nunnery, MD;  Location: [facility];  Service: ENT;  Laterality: N/A;  esophogoscopy with dilation, botox injection  . FOOT SURGERY  03/14/2011   gastroc slide-rt  . GASTROSTOMY N/A 02/08/2020   Procedure: INSERTION OF GASTROSTOMY TUBE;  Surgeon: Donnie Mesa, MD;  Location: [facility];  Service: General;  Laterality: N/A;  . HIP ARTHROPLASTY Right 10/28/2019   Procedure: ARTHROPLASTY BIPOLAR HIP (HEMIARTHROPLASTY);  Surgeon: Marybelle Killings, MD;  [facility];  Service: Orthopedics;  Laterality: Right;  . LAPAROTOMY N/A 02/08/2020   Procedure: EXPLORATORY LAPAROTOMY;  Surgeon: Donnie Mesa, MD;  Location: [facility];  Service: General;  Laterality: N/A;  . LYSIS OF ADHESION N/A 02/08/2020   Procedure: LYSIS OF ADHESIONS;  Surgeon: Donnie Mesa, MD;  Location: [facility];  Service: General;  Laterality: N/A;  . TOTAL ABDOMINAL HYSTERECTOMY    . TOTAL HIP REVISION Right 11/16/2019   Procedure: TOTAL HIP REVISION BIPOLAR TO CEMENTED BIPOLAR;  Surgeon: Marybelle Killings, MD;  Location: [facility];  Service: Orthopedics;  Laterality: Right;  . WISDOM TOOTH EXTRACTION      ROS- all systems are reviewed and negatives except as per HPI above  Current Outpatient Medications  Medication Sig Dispense Refill  . acetaminophen (TYLENOL) 325 MG tablet Take 2 tablets (650 mg  total) by mouth every 6 (six) hours as needed for mild pain (or Fever >/= 101).    Derrill Memo ON 10/04/2020] apixaban (ELIQUIS) 2.5 MG TABS tablet Take 1 tablet (2.5 mg total) by mouth 2 (two) times daily. 180 tablet 3  . Biotin 5000 MCG TABS Take 5,000 mcg by mouth daily.    . budesonide-formoterol (SYMBICORT) 80-4.5 MCG/ACT inhaler Inhale 2 puffs into the lungs 2 (two) times daily. 10.2 g 3  . Cholecalciferol (VITAMIN D-3) 125 MCG (5000 UT) TABS Take 5,000 Units by mouth daily. 30 tablet 0  . diclofenac Sodium (VOLTAREN) 1 % GEL Apply 2 g topically 4 (four) times daily. 350 g 1  . FLUoxetine (PROZAC) 20 MG capsule Take 1  capsule by mouth daily.    . furosemide (LASIX) 20 MG tablet Take 20 mg by mouth daily.    [name] Kitchen levothyroxine (SYNTHROID) 50 MCG tablet Take 1 tablet (50 mcg total) by mouth daily. 90 tablet 3  . ondansetron (ZOFRAN) 8 MG tablet Take 8 mg by mouth 3 (three) times daily.     . polyethylene glycol (MIRALAX / GLYCOLAX) 17 g packet Take 17 g by mouth daily. 14 each 0  . pravastatin (PRAVACHOL) 40 MG tablet Take 1 tablet (40 mg total) by mouth at bedtime. 30 tablet 0  . vitamin B-12 (CYANOCOBALAMIN) 1000 MCG tablet Take 1,000 mcg by mouth 3 (three) times a week. MWF    . zinc gluconate 50 MG tablet Take 50 mg by mouth daily.     No current facility-administered medications for this visit.    Physical Exam: Vitals:   10/03/20 1528  BP: 124/66  Pulse: 93  SpO2: 93%  Weight: 156 lb 9.6 oz (71 kg)  Height: 5' 0.65" (1.541 m)    GEN- The patient is elderly appearing, alert and oriented x 3 today.   Head- normocephalic, atraumatic Eyes-  Sclera clear, conjunctiva pink Ears- hearing intact Oropharynx- clear Lungs- Clear to ausculation bilaterally, normal work of breathing Heart- Regular rate and rhythm, no murmurs, rubs or gallops, PMI not laterally displaced GI- soft, NT, ND, + BS Extremities- + edema (legs wrapped)  Wt Readings from Last 3 Encounters:  10/03/20 156 lb 9.6 oz (71 kg)  08/01/20 164 lb 3.2 oz (74.5 kg)  06/20/20 156 lb (70.8 kg)    EKG tracing ordered today is personally reviewed and shows rate controlled afib  Echo 10/28/19- EF 60%, moderate RV dysfunction, severe RA enlargement, mild to moderate TR,    Assessment and Plan:  1. Permanent afib Rate controlled chads2vasc score is at least 4.  She is on coumadin for stroke prevention. We discussed [place] options today including coumadin, eliquis and xarelto.  Pt would like to switch to eliquis. Stop coumadin Start eliquis 5mg  BID  2. Chronic systolic dysfunction Stable No change required today Sodium restriction  advised  3. Chronic lung disease Stable No change required today  4. Venous insuffiency Legs are wrapped Support hose Low dose lasix Sodium restriction  Risks, benefits and potential toxicities for medications prescribed and/or refilled reviewed with patient today.   Follow-up with EP PA in 6 months and with Dr Caryl Comes in a year  Thompson Grayer MD, [facility] 10/03/2020 3:54 PM

## 2020-10-03 NOTE — Patient Instructions (Addendum)
Medication Instructions: Stop Coumadin  Tomorrow evening take first dose of Eliquis 2.5 mg two times a day ( one dose tomorrow)   Your physician recommends that you continue on your current medications as directed. Please refer to the Current Medication list given to you today.  Labwork: None ordered.  Testing/Procedures: None ordered.  Follow-Up: Your physician wants you to follow-up in: 6 months with Thompson Grayer, MD or one of the following Advanced Practice Providers on your designated Care Team:      Tommye Standard, PA-C     You will receive a reminder letter in the mail two months in advance. If you don't receive a letter, please call our office to schedule the follow-up appointment.  Any Other Special Instructions Will Be Listed Below (If Applicable).  If you need a refill on your cardiac medications before your next appointment, please call your pharmacy.    Low-Sodium Eating Plan (2 grams a day)  Sodium, which is an element that makes up salt, helps you maintain a healthy balance of fluids in your body. Too much sodium can increase your blood pressure and cause fluid and waste to be held in your body. Your health care provider or dietitian may recommend following this plan if you have high blood pressure (hypertension), kidney disease, liver disease, or heart failure. Eating less sodium can help lower your blood pressure, reduce swelling, and protect your heart, liver, and kidneys. What are tips for following this plan? Reading food labels  The Nutrition Facts label lists the amount of sodium in one serving of the food. If you eat more than one serving, you must multiply the listed amount of sodium by the number of servings.  Choose foods with less than 140 mg of sodium per serving.  Avoid foods with 300 mg of sodium or more per serving. Shopping  Look for lower-sodium products, often labeled as "low-sodium" or "no salt added."  Always check the sodium content, even if  foods are labeled as "unsalted" or "no salt added."  Buy fresh foods. ? Avoid canned foods and pre-made or frozen meals. ? Avoid canned, cured, or processed meats.  Buy breads that have less than 80 mg of sodium per slice.   Cooking  Eat more home-cooked food and less restaurant, buffet, and fast food.  Avoid adding salt when cooking. Use salt-free seasonings or herbs instead of table salt or sea salt. Check with your health care provider or pharmacist before using salt substitutes.  Cook with plant-based oils, such as canola, sunflower, or olive oil.   Meal planning  When eating at a restaurant, ask that your food be prepared with less salt or no salt, if possible. Avoid dishes labeled as brined, pickled, cured, smoked, or made with soy sauce, miso, or teriyaki sauce.  Avoid foods that contain MSG (monosodium glutamate). MSG is sometimes added to [place] food, bouillon, and some canned foods.  Make meals that can be grilled, baked, poached, roasted, or steamed. These are generally made with less sodium. General information Most people on this plan should limit their sodium intake to 1,500-2,000 mg (milligrams) of sodium each day. What foods should I eat? Fruits Fresh, frozen, or canned fruit. Fruit juice. Vegetables Fresh or frozen vegetables. "No salt added" canned vegetables. "No salt added" tomato sauce and paste. Low-sodium or reduced-sodium tomato and vegetable juice. Grains Low-sodium cereals, including oats, puffed wheat and rice, and shredded wheat. Low-sodium crackers. Unsalted rice. Unsalted pasta. Low-sodium bread. Whole-grain breads and whole-grain pasta. Meats  and other proteins Fresh or frozen (no salt added) meat, poultry, seafood, and fish. Low-sodium canned tuna and salmon. Unsalted nuts. Dried peas, beans, and lentils without added salt. Unsalted canned beans. Eggs. Unsalted nut butters. Dairy Milk. Soy milk. Cheese that is naturally low in sodium, such as ricotta  cheese, fresh mozzarella, or Swiss cheese. Low-sodium or reduced-sodium cheese. Cream cheese. Yogurt. Seasonings and condiments Fresh and dried herbs and spices. Salt-free seasonings. Low-sodium mustard and ketchup. Sodium-free salad dressing. Sodium-free light mayonnaise. Fresh or refrigerated horseradish. Lemon juice. Vinegar. Other foods Homemade, reduced-sodium, or low-sodium soups. Unsalted popcorn and pretzels. Low-salt or salt-free chips. The items listed above may not be a complete list of foods and beverages you can eat. Contact a dietitian for more information. What foods should I avoid? Vegetables Sauerkraut, pickled vegetables, and relishes. Olives. [place] fries. Onion rings. Regular canned vegetables (not low-sodium or reduced-sodium). Regular canned tomato sauce and paste (not low-sodium or reduced-sodium). Regular tomato and vegetable juice (not low-sodium or reduced-sodium). Frozen vegetables in sauces. Grains Instant hot cereals. Bread stuffing, pancake, and biscuit mixes. Croutons. Seasoned rice or pasta mixes. Noodle soup cups. Boxed or frozen macaroni and cheese. Regular salted crackers. Self-rising flour. Meats and other proteins Meat or fish that is salted, canned, smoked, spiced, or pickled. Precooked or cured meat, such as sausages or meat loaves. Berniece Salines. Ham. Pepperoni. Hot dogs. Corned beef. Chipped beef. Salt pork. Jerky. Pickled herring. Anchovies and sardines. Regular canned tuna. Salted nuts. Dairy Processed cheese and cheese spreads. Hard cheeses. Cheese curds. Blue cheese. Feta cheese. String cheese. Regular cottage cheese. Buttermilk. Canned milk. Fats and oils Salted butter. Regular margarine. Ghee. Bacon fat. Seasonings and condiments Onion salt, garlic salt, seasoned salt, table salt, and sea salt. Canned and packaged gravies. Worcestershire sauce. Tartar sauce. Barbecue sauce. Teriyaki sauce. Soy sauce, including reduced-sodium. Steak sauce. Fish sauce. Oyster  sauce. Cocktail sauce. Horseradish that you find on the shelf. Regular ketchup and mustard. Meat flavorings and tenderizers. Bouillon cubes. Hot sauce. Pre-made or packaged marinades. Pre-made or packaged taco seasonings. Relishes. Regular salad dressings. Salsa. Other foods Salted popcorn and pretzels. Corn chips and puffs. Potato and tortilla chips. Canned or dried soups. Pizza. Frozen entrees and pot pies. The items listed above may not be a complete list of foods and beverages you should avoid. Contact a dietitian for more information. Summary  Eating less sodium can help lower your blood pressure, reduce swelling, and protect your heart, liver, and kidneys.  Most people on this plan should limit their sodium intake to 1,500-2,000 mg (milligrams) of sodium each day.  Canned, boxed, and frozen foods are high in sodium. Restaurant foods, fast foods, and pizza are also very high in sodium. You also get sodium by adding salt to food.  Try to cook at home, eat more fresh fruits and vegetables, and eat less fast food and canned, processed, or prepared foods. This information is not intended to replace advice given to you by your health care provider. Make sure you discuss any questions you have with your health care provider. Document Revised: 07/24/2019 Document Reviewed: 05/20/2019 [employer] Patient Education  2021 [employer].

## 2020-10-04 DIAGNOSIS — I5032 Chronic diastolic (congestive) heart failure: Secondary | ICD-10-CM | POA: Diagnosis not present

## 2020-10-04 DIAGNOSIS — I5020 Unspecified systolic (congestive) heart failure: Secondary | ICD-10-CM | POA: Diagnosis not present

## 2020-10-04 DIAGNOSIS — I110 Hypertensive heart disease with heart failure: Secondary | ICD-10-CM | POA: Diagnosis not present

## 2020-10-04 DIAGNOSIS — I482 Chronic atrial fibrillation, unspecified: Secondary | ICD-10-CM | POA: Diagnosis not present

## 2020-10-04 DIAGNOSIS — J849 Interstitial pulmonary disease, unspecified: Secondary | ICD-10-CM | POA: Diagnosis not present

## 2020-10-04 DIAGNOSIS — J479 Bronchiectasis, uncomplicated: Secondary | ICD-10-CM | POA: Diagnosis not present

## 2020-10-06 ENCOUNTER — Ambulatory Visit: Payer: Medicare Other | Admitting: Sports Medicine

## 2020-10-06 DIAGNOSIS — I110 Hypertensive heart disease with heart failure: Secondary | ICD-10-CM | POA: Diagnosis not present

## 2020-10-06 DIAGNOSIS — J849 Interstitial pulmonary disease, unspecified: Secondary | ICD-10-CM | POA: Diagnosis not present

## 2020-10-06 DIAGNOSIS — I482 Chronic atrial fibrillation, unspecified: Secondary | ICD-10-CM | POA: Diagnosis not present

## 2020-10-06 DIAGNOSIS — I5032 Chronic diastolic (congestive) heart failure: Secondary | ICD-10-CM | POA: Diagnosis not present

## 2020-10-06 DIAGNOSIS — I5020 Unspecified systolic (congestive) heart failure: Secondary | ICD-10-CM | POA: Diagnosis not present

## 2020-10-06 DIAGNOSIS — J479 Bronchiectasis, uncomplicated: Secondary | ICD-10-CM | POA: Diagnosis not present

## 2020-10-10 DIAGNOSIS — J479 Bronchiectasis, uncomplicated: Secondary | ICD-10-CM | POA: Diagnosis not present

## 2020-10-10 DIAGNOSIS — I5032 Chronic diastolic (congestive) heart failure: Secondary | ICD-10-CM | POA: Diagnosis not present

## 2020-10-10 DIAGNOSIS — J849 Interstitial pulmonary disease, unspecified: Secondary | ICD-10-CM | POA: Diagnosis not present

## 2020-10-10 DIAGNOSIS — I482 Chronic atrial fibrillation, unspecified: Secondary | ICD-10-CM | POA: Diagnosis not present

## 2020-10-10 DIAGNOSIS — I5020 Unspecified systolic (congestive) heart failure: Secondary | ICD-10-CM | POA: Diagnosis not present

## 2020-10-10 DIAGNOSIS — I110 Hypertensive heart disease with heart failure: Secondary | ICD-10-CM | POA: Diagnosis not present

## 2020-10-13 DIAGNOSIS — J479 Bronchiectasis, uncomplicated: Secondary | ICD-10-CM | POA: Diagnosis not present

## 2020-10-13 DIAGNOSIS — I5020 Unspecified systolic (congestive) heart failure: Secondary | ICD-10-CM | POA: Diagnosis not present

## 2020-10-13 DIAGNOSIS — I5032 Chronic diastolic (congestive) heart failure: Secondary | ICD-10-CM | POA: Diagnosis not present

## 2020-10-13 DIAGNOSIS — I509 Heart failure, unspecified: Secondary | ICD-10-CM | POA: Diagnosis not present

## 2020-10-13 DIAGNOSIS — I110 Hypertensive heart disease with heart failure: Secondary | ICD-10-CM | POA: Diagnosis not present

## 2020-10-13 DIAGNOSIS — J849 Interstitial pulmonary disease, unspecified: Secondary | ICD-10-CM | POA: Diagnosis not present

## 2020-10-13 DIAGNOSIS — I482 Chronic atrial fibrillation, unspecified: Secondary | ICD-10-CM | POA: Diagnosis not present

## 2020-10-17 DIAGNOSIS — I5032 Chronic diastolic (congestive) heart failure: Secondary | ICD-10-CM | POA: Diagnosis not present

## 2020-10-17 DIAGNOSIS — I5020 Unspecified systolic (congestive) heart failure: Secondary | ICD-10-CM | POA: Diagnosis not present

## 2020-10-17 DIAGNOSIS — J849 Interstitial pulmonary disease, unspecified: Secondary | ICD-10-CM | POA: Diagnosis not present

## 2020-10-17 DIAGNOSIS — J479 Bronchiectasis, uncomplicated: Secondary | ICD-10-CM | POA: Diagnosis not present

## 2020-10-17 DIAGNOSIS — I482 Chronic atrial fibrillation, unspecified: Secondary | ICD-10-CM | POA: Diagnosis not present

## 2020-10-17 DIAGNOSIS — I110 Hypertensive heart disease with heart failure: Secondary | ICD-10-CM | POA: Diagnosis not present

## 2020-10-18 DIAGNOSIS — J479 Bronchiectasis, uncomplicated: Secondary | ICD-10-CM | POA: Diagnosis not present

## 2020-10-18 DIAGNOSIS — I5020 Unspecified systolic (congestive) heart failure: Secondary | ICD-10-CM | POA: Diagnosis not present

## 2020-10-18 DIAGNOSIS — Z0000 Encounter for general adult medical examination without abnormal findings: Secondary | ICD-10-CM | POA: Diagnosis not present

## 2020-10-18 DIAGNOSIS — Z7901 Long term (current) use of anticoagulants: Secondary | ICD-10-CM | POA: Diagnosis not present

## 2020-10-18 DIAGNOSIS — E039 Hypothyroidism, unspecified: Secondary | ICD-10-CM | POA: Diagnosis not present

## 2020-10-18 DIAGNOSIS — I482 Chronic atrial fibrillation, unspecified: Secondary | ICD-10-CM | POA: Diagnosis not present

## 2020-10-18 DIAGNOSIS — Z1389 Encounter for screening for other disorder: Secondary | ICD-10-CM | POA: Diagnosis not present

## 2020-10-18 DIAGNOSIS — F324 Major depressive disorder, single episode, in partial remission: Secondary | ICD-10-CM | POA: Diagnosis not present

## 2020-10-18 DIAGNOSIS — E785 Hyperlipidemia, unspecified: Secondary | ICD-10-CM | POA: Diagnosis not present

## 2020-10-18 DIAGNOSIS — I10 Essential (primary) hypertension: Secondary | ICD-10-CM | POA: Diagnosis not present

## 2020-10-20 ENCOUNTER — Other Ambulatory Visit: Payer: Self-pay

## 2020-10-20 ENCOUNTER — Ambulatory Visit (INDEPENDENT_AMBULATORY_CARE_PROVIDER_SITE_OTHER): Payer: Medicare Other | Admitting: Internal Medicine

## 2020-10-20 ENCOUNTER — Encounter: Payer: Self-pay | Admitting: Internal Medicine

## 2020-10-20 VITALS — BP 122/86 | HR 65 | Temp 97.6°F | Ht 66.0 in | Wt 155.0 lb

## 2020-10-20 DIAGNOSIS — J849 Interstitial pulmonary disease, unspecified: Secondary | ICD-10-CM | POA: Diagnosis not present

## 2020-10-20 DIAGNOSIS — G4734 Idiopathic sleep related nonobstructive alveolar hypoventilation: Secondary | ICD-10-CM

## 2020-10-20 NOTE — Progress Notes (Signed)
OV 03/24/2015  Chief Complaint  Patient presents with  . Follow-up    Pt here after PFT in 01/2015. Pt using nocturnal O2 and has noticed a difference the next day in her breathing. Pt states she has a prod cough with discolored mucus, PND, sinus pressure.    Stephanie Cortez is here for follow-up with her daughter [name], they are presenting for review of ILD workup. I'd always followed up with her baseline diagnosis of bronchiectasis but it started becoming more apparent that she actually has ILD. 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 According to the radiologist it has been stable since 2014 at least. Autoimmune workup showed elevated angiotensin-converting enzyme and CK but otherwise normal. Since the diagnosis of elevated CK her primary care physician has stopped her statin. There is no follow-up CK on this. We also notices that she had nocturnal desaturations restarted her on oxygen and sent her to pulmonary rehabilitation. These 2 measures haven't helped her immensely. She still continues a Symbicort for the prior diagnosis of bronchiectasis. She is inclined to continue that. She will have a flu shot today. She wants to know more about interstitial lung disease and had lot of questions about it  OV  08/11/2015: Acute office visit:  Patient presents to the office today with complaint of chest congestion and cough with yellow mucus intermittent wheezing and shortness of breath that has been ongoing for about 2-3 weeks, but per patient this has worsened over the last 2-3 days. Patient denies fever, chills, chest pain or hemoptysis. Patient endorses scant bloody mucus from her nose, that she feels this is probably related to her nocturnal oxygen use. She presented to her primary care physician [name] at [facility] and was treated initially with a Z-Pak. When Z-Pak did not resolve patient's symptoms she was then treated with clarithromycin 500 mg twice daily  starting 07/25/2015 for a 10 day treatment period . She is currently still taking this antibiotic. States since starting the clarithromycin that she has noticed some improvement.    OV 09/26/2015  Chief Complaint  Patient presents with  . Follow-up    Pt here after seeing SG on 08/10/15 for an acute visit. Pt here after 24mwt and PFT. Pt states she is still not feeling well. Pt c/o headache, hoarseness, wheezing, prod cough. Pt denies increase in SOB and f/c/s.    Routine office visit for this lady with ILD/NSIP pattern (previous diagnosis of bronchiectasis and on Symbicort]. Has associated sinus problems. Her daughter [name] is not with her today.   87 year old female presents for follow-up. She is frustrated by her chronic sinus drainage and chronic sinus tension  in the frontal area. This is been going on for many months a few years. She seen Dr. [name] in ENT and apparently is planning a procedure. In the last several days to a few weeks she's having increasing headache particularly in the last 3 days. However there is no increase in sinus drainage volume or change in color of the sinus drainage [baseline color is much reading between white and yellow and green]  However in the last day or 2 she's increasing cough and wheezing despite taking Symbicort.  She is frustrated by her symptoms. She wants a definite answer currently same time she is reluctant to have surgical lung biopsy. She is also reluctant to have chronic daily prednisone because of side effects. She is also at the same time  frustrated about her extreme level of curiosity and need to search the Internet about her disease condition and then this works her into an anxiety      Spirometry today 09/26/2015 shows FVC 1.55 L/54%, FEV1 1.28 L/59% and a ratio of 83. This comes 6 weeks of her short prednisone burst. This a 300 mL decline in FVC since August 2016 but an improvement by 170 mL since June 2016   6 minute walk test today: Walk  336 m. Resting pulse ox was 98%. Peak exertion pulse ox was 94%. She had back pain but not shortness of breath.  Last CT scan of the chest was June 2016 which showed NSIP pattern  Last CT scan of the sinuses August 2013 that showed mild mucosal edema in the paranasal sinuses    OV 01/04/2016  Chief Complaint  Patient presents with  . Follow-up    Breathing has been doing well, same as usual.  Discuss oxygen, has not worn oxygen x 2 weeks.  patient only uses at night, discuss testing to get off oxygen.  Having surgery on spine in August, wants to discuss. Wheezing.    FU NSIP with air trapping and cough/wheeze - on symbicort (prior dx in 2015)  - Routine fu. Had annual HRCT - stable ILD with air trapping x 2 years. Overall well. Does not want to use nocturnal o2. But having wheeze and cough that is stable but says symbicort is not helping. Also, reports saysShe is having spine surgery to the low spine in the next few months by Dr [name] and she wants know her preoperative pulmonary risk. She also wants to know the basis of her interstitial lung disease which we have discussed in the past but at this point in time her main issues symptoms. Walking desaturation test 185 feet 3 laps on room at in the office she did not desaturate   OV 02/20/2016  Chief Complaint  Patient presents with  . Acute Visit    Pt c/o prod cough with yellow mucus with scant amount of blood, increase in SOB, wheezing. Pt states the spiriva makes her cough.    FU NSIP with air trapping and cough/wheeze - on symbicort (prior dx in 2015).  Last visit in July 2017 because of air trapping we took her off Symbicort and tried Spiriva and Brio she called on 02/17/2016 increasing cough. She was rotated back to Symbicort. She clearly attributes the worsening cough and symptoms to Spiriva. She feels that after going on Symbicort she is better. However she is having some yellow mucus and feels she is having acute bronchitis. She  has multiple drug allergies listed below. Regarding her interstitial lung disease this is been stable for 2 years. Last visit walking desaturation test did not desaturate. She then had overnight oxygen study 02/07/2016 and results are below and she does not need nocturnal oxygen. Therefore she will not need sleep study  OV 04/04/2017  Chief Complaint  Patient presents with  . Follow-up    Pt states that she has been doing well. Breathing is at a stable point at this moment. Occ. cough and occ. SOB. Denies any CP.    Follow-up NSIP with a trapping and cough/wheeze and Symbicort responsive   83-year-ol overall stable. No new complaints. Not seen her in over a year. No interim health issues other than back pain for which she is avoiding back surgery. She is in need of a flu shot. She feels Symbicort is working well for  her and she wants to continue this. Without Symbicort she decompensated. Walking desaturation test 185 feet 3 laps on room air: Resting heart rate 63. Final 197. Resting pulse ox 100%. Final pulse ox 99%.   08/19/2017 acute extended ov/Wert re: acute cough in setting of flare of rhinitis? sinusitis Chief Complaint  Patient presents with  . Acute Visit    wheezing,SOB with activity,productive cough with green, thick sputum, has started mucinex   nasal symptoms flared  X one week prior to OV with facial pressure/ nasal congestion  Then worse cough/ wheeze with mucus turning green, some epistaxis but no green nasal d/c Not normally needing any kind of rescue rx / does not even have one  symptoms Worse hs and off and on during  noct / props up on several pillows helps some Not limited by breathing from desired activities  But very sedentary  rec Omnicef 300 mg twice daily x 10 days Prednisone 10 mg take  4 each am x 2 days,   2 each am x 2 days,  1 each am x 2 days and stop  I emphasized that nasal steroids (flonase) ave no immediate benefit in terms of improving symptoms.  To help  them reached the target tissue, the patient should use Afrin two puffs every 12 hours  Work on perfecting inhaler technique:   - late add: should notify coumadin adviser re 10 day course of abx       08/27/2017  Acute extended  ov/Wert re: refractory cough/ wheeze Chief Complaint  Patient presents with  . Acute Visit    cough and wheezing are unchanged since last visit. She is coughing up some green sputum- still taking omnicef.    Dyspnea:  No really sob Cough: less in volume, still green, some from nose/mostly from chest/ wakes her up each am / worse also when head hit pillow  Sleep: poorly / 45 degrees SABA use:  N/a Has flutter valve not suing   OV 11/20/2017    Chief Complaint  Patient presents with  . Follow-up    Pt supposed to have PFT but was unable to show for it. Pt saw MW twice February 2019 due to a cough. Pt states her cough is now better. Has some mild SOB.    Follow-up NSIP with a trapping and cough/wheeze and Symbicort responsive    Follow-up interstitial lung disease associated with air trapping and Symbicort responsive.  She presents for follow-up.  Since I saw her last in October 2018 she presented 2 times in February 2019 acutely to this office and treated with Omnicef and prednisone and subsequently symptomatic treatment.  Followed up with nurse practitioner in March.  Pulmonary function test recommended for today's visit but because of delays in her previous appointment at the Coumadin clinic she could not get it done.  But at this point in time she feels her cough is resolved back to baseline she feels good.  She feels Symbicort is helping her a lot.  Walking desaturation test today is essentially close to normal other than mild desaturation to 3 points.  There are no new issues.   OV 03/06/2018  Subjective:  Patient ID: Stephanie Cortez, female , DOB: 13-Oct-1933 , age 25 y.o. , MRN: [id number] , ADDRESS: [place] [address]   03/06/2018 -    Chief Complaint  Patient presents with  . Follow-up    Pt states she has been doing okay since last visit and is  mainly here today to discuss prognosis and wants more information abotu her diagnosis. Pt states she has not been sleeping well and states she still has the dry cough. Pt states SOB is stable.     HPI Stephanie Cortez 28 y.o. -ast seen in May 2019. She was supposed to see her back in October 2019 with monitoring pulmonary function testing and high risk CT for her ILD not otherwise specified and associated possible cough variant asthma. Howevershe started thinking about this appointment and got extremely worried and anxious. She started looking at the Internet and she thought she might have IPF and she would die in the next few years.Therefore she is here to discuss this. Overall she's stable. She'll have a high dose flu shot. There are no new complaints. Back pain bothers her more This no worsening in dyspnea. There is no wheeze or cough     ROS - per HPI     OV 06/01/2019  Subjective:  Patient ID: Stephanie Cortez, female , DOB: 06-11-1934 , age 30 y.o. , MRN: [id number] , ADDRESS: [place] [address]   06/01/2019 -   Chief Complaint  Patient presents with  . Follow-up    Pt states she has been doing okay since last visit. States she will become breathless faster than before.    Follow-up history of asthma and interstitial lung disease not otherwise specified  HPI Stephanie Cortez 89 y.o. -returns for follow-up.  Last seen September 2019.  At that point in time it was becoming more and more apparent that she had definite interstitial lung disease.  It was stable.  She was worried it might be IPF although the pattern did not fit.  Nevertheless she was stable.  I did recommend to her that she have a high-resolution CT chest and pulmonary function test as soon as possible and return in October 2019.  However she never did.  She does not have a satisfactory  answer as to why she did did not return.  It appears that was the end of 2019 her sister in [place] got ill and by 08/08/2018 she passed away.  Then COVID-19 pandemic here.  She says she has been social distancing at home.  Her daughter and visits with her.  She had a limited Thanksgiving with 6 people.  Everybody is doing well after Thanksgiving.  She has been masking as well.  Nevertheless she feels overall her shortness of breath is declined steadily in the last 1 year.  Symptom score is listed below.  In walking desaturation test she was only able to do 2 out of the 3 laps.  A year ago she was able to do all 3 laps.  Her pulse ox drop is worse than before.  She also has chronic cough and postnasal drainage and this is stable.  Most recent pulmonary function test and CT scan numbers are listed below.  Symptom score is listed below.       IMPRESSION: 1. Interstitial lung disease characterized by upper lobe predominant patchy peribronchovascular and subpleural reticulation and faint ground-glass attenuation with associated mild traction bronchiectasis. No frank honeycombing. Extensive air trapping. These findings have not appreciably changed and are most consistent with chronic hypersensitivity pneumonitis. These findings are not consistent with usual interstitial pneumonia (UIP). 2. Two vessel coronary atherosclerosis.  Stable mild cardiomegaly. 3. Aortic atherosclerosis.   Electronically Signed   By: Ilona Sorrel M.D.   On: 12/26/2015 15:44  Results for Blakesley,  Stephanie Cortez (MRN [id number]) as of 06/01/2019 10:58  Ref. Range 12/22/2014 12:33  Anti Nuclear Antibody (ANA) Latest Ref Range: NEGATIVE  NEG  Angiotensin-Converting Enzyme Latest Ref Range: 8 - 52 U/L 77 (H)  Cyclic Citrullin Peptide Ab Latest Ref Range: 0.0 - 5.0 U/mL <2.0  ds DNA Ab Latest Units: IU/mL <1  Myeloperoxidase Abs Latest Ref Range: <1.0 AI  <1.0  Serine Protease 3 Latest Ref Range: <1.0 AI  <1.0  RA Latex  Turbid. Latest Ref Range: <=14 IU/mL 13  Ribonucleic Protein(ENA) Antibody, IgG Latest Ref Range: <1.0 NEG AI  <1.0 NEG  SSA (Ro) (ENA) Antibody, IgG Latest Ref Range: <1.0 NEG AI  <1.0 NEG  SSB (La) (ENA) Antibody, IgG Latest Ref Range: <1.0 NEG AI  <1.0 NEG  Scleroderma (Scl-70) (ENA) Antibody, IgG Latest Ref Range: <1.0 NEG AI  <1.0 NEG     OV 09/03/2019  Subjective:  Patient ID: Stephanie Cortez, female , DOB: 1934-02-15 , age 23 y.o. , MRN: [id number] , ADDRESS: [place] [address]   09/03/2019 -   Chief Complaint  Patient presents with  . Follow-up    Pt states she wears O2 at night which she states is helping. Pt will still become SOB with exertion.   Follow-up interstitial lung disease not otherwise specified. MDD discussion 08/11/2019: Very concerning for hypersensitive pneumonitis.  HPI Stephanie Cortez 57 y.o. -last visit in our 2020 she reported worsening shortness of breath.  I was worried that her ILD was getting worse.  Therefore she did a high-resolution CT chest and it shows significant worsening especially with development of upper lobe groundglass opacities inflammatory findings with air trapping.  Very suggestive of hypersensitive pneumonitis.  She tells me that ever since moving from [place] to [facility] she is lived in the same home for 10 years.  In the year of COVID-19 in 2020 she has been mostly homebound.  There is mold in the basement and the basement is damp but she never goes there.  So she is puzzled about these findings.   [facility] Comprehensive ILD Questionnaire  Symptoms:  -Insidious onset of shortness of breath that is getting worse for the last several months.  Severity of dyspnea listed below.  She also has a cough from time to time.  The cough has been going on for years.  The cough is the same and ranges anywhere from mild to moderate.  She does cough at night some but not often.  She also brings up some phlegm but  not often.  The phlegm is clear to slightly yellow.  Never had hemoptysis does clear the throat.  Occasional wheezing present.  No nausea vomiting or diarrhea.   SYMPTOM SCALE - ILD 06/01/2019  09/03/2019    O2 use RA Night o2  Shortness of Breath 0 -> 5 scale with 5 being worst (score 6 If unable to do)   At rest 0.25 0  Simple tasks - showers, clothes change, eating, shaving 1 0  Household (dishes, doing bed, laundry) 1 0  Shopping 2 1.5  Walking level at own pace 1 0  Walking up Stairs 2.5 3  Total (40 - 48) Dyspnea Score 7.75 4.5  How bad is your cough? slight 1.5  How bad is your fatigue mld 1.5  nause  0  vomit  0  darrhea  0  anxiety  1  depressio  0      Past Medical History : She  got diagnosis COPD but she has ILD.  Likewise she got diagnosed with asthma but she has ILD.  There is in the past she has had diastolic heart failure and she has atrial fibrillation on Coumadin.  She has thyroid disease for the last few decades.  No collagen vascular disease no lupus no polymyositis no Sjogren's.  No diabetes n   ROS: Positive for arthralgia and fatigue.  No dysphagia no dry eyes no Raynaud's no nausea no vomiting.  Does have acid reflux occasionally.   FAMILY HISTORY of LUNG DISEASE: Denies.  Denies pulmonary fibrosis in the family.   EXPOSURE HISTORY: No smoke exposure.  No cigarettes no vaping no cocaine no marijuana no IV drug use no electronic cigarettes.   HOME and HOBBY DETAILS : She lives in a home that was built in the 101s.  She is lived in this home for [place] years [place] single-family.  The basement is damp and she thinks there is mold in it but she never goes there.  There is no mold or mildew in the shower curtain no mold or mildew in the bathroom does not use a humidifier.  No CPAP use no nebulizer use no steam iron use no misting Fountain use.  No pet birds or parakeets no pet gerbils.  She does not use any feather pillows or duvet.  She is not  tested of any mold in the [facility] duct system so she does not know/does not play any wind instruments.  No guarding habits.   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  Denies completely   PULMONARY TOXICITY HISTORY (27 items): She took amiodarone for short.  At age 60 but otherwise negative.        Results for Stephanie Cortez, Stephanie Cortez (MRN [id number]) as of 11/20/2017 11:40  Ref. Range 12/22/2014 10:11 03/02/2015 16:52 09/26/2015 10:04  FVC-Pre Latest Units: L 1.38 1.88 1.55  FVC-%Pred-Pre Latest Units: % 47 65 54   Results for Stephanie Cortez, Stephanie Cortez (MRN [id number]) as of 11/20/2017 11:40  Ref. Range 12/22/2014 10:11 03/02/2015 16:52  DLCO unc Latest Units: ml/min/mmHg 17.07 16.90  DLCO unc % pred Latest Units: % 63 62     IMPRESSION: 1. The appearance of the lungs is compatible with interstitial lung disease, with a spectrum of findings considered most compatible with an alternative diagnosis to usual interstitial pneumonia (UIP) per current ATS guidelines. Specifically, imaging findings are most suggestive of progressive chronic hypersensitivity pneumonitis. 2. Aortic atherosclerosis, in addition to left main and right coronary artery disease. 3. Dilatation of the pulmonic trunk (3.6 cm in diameter), concerning for pulmonary arterial hypertension. 4. Cardiomegaly with biatrial dilatation (right greater than left).  Aortic Atherosclerosis (ICD10-I70.0).   Electronically Signed   By: Vinnie Langton M.D.   On: 07/17/2019 15:19  ROS - per HPI   OV 10/20/2020  Subjective:  Patient ID: Stephanie Cortez, female , DOB: 13-May-1934 , age 40 y.o. , MRN: [id number] , ADDRESS: [address] [place] [address] PCP [name], MD Patient Care Team: [name], MD as PCP - General (Family Medicine) Deboraha Sprang, MD as PCP - Cardiology (Cardiology) Deboraha Sprang, MD (Cardiology) Sable Feil, MD (Gastroenterology) Brand Males, MD  (Pulmonary Disease) Renato Shin, MD (Endocrinology) Erline Levine, MD (Neurosurgery) Weber Cooks, MD (Inactive) (Orthopedic Surgery) Rozetta Nunnery, MD (Otolaryngology)  This Provider for this visit: Treatment Team:  Attending Provider: Brand Males, MD  Follow-up interstitial lung  disease of alternative to UIP pattern.  Stable for many years but between 2020 and 2021 progressive.  Concern for hypersensitive pneumonitis  Last CT chest January 2021 Last PFT March 2021  10/20/2020 -   Chief Complaint  Patient presents with  . Follow-up    Reports concern that breathing has gotten more shallow over past year. Still wants to get scheduled for bronchoscopy. Concerned about clear phlegm.      HPI Stephanie Cortez 59 y.o. -last seen in the spring 2021.  At that time because of progressive ILD and concern for hypersensitive pneumonitis we discussed s doing a bronchoscopy with lavage to help narrow the differential diagnosis.  However this did not happen..  Initially patient was concerned but later by April 2021 she fractured her hip.  Since then she has not followed up.  Today she presents with her daughter and.  They both tell me that after the fracture she went to inpatient rehab.  And that she sustained a recurrent fracture and had to have another surgery in the month of May/June 2021.  Subsequently went home with PT.  After that in August 2021 also had bowel obstruction presumably due to opioids and had to have laparotomy.  Since then she has been doing home physical therapy and she is graduating from that.  Because of although she is lost a lot of weight and she is frail.  However she has never needed oxygen.  She not even using oxygen at night and is questioning whether she needs it 1 year ago she told me that actually helped her.  She continues with the Symbicort.  She does have crackles in the lung and intermittent squeaks which is baseline.  However she does not feel this.  She  is back now because she wants to reestablish with myself at the ILD center.  She is interested in bronchoscopy.  She is also interested in getting better and stronger.  ILD symptom score and simple walking desaturation test is deferred at this visit   [place] - ILD 06/01/2019  09/03/2019   10/20/2020 Lost lot of weight. Uses walker. Frail.   O2 use RA Night o2   Shortness of Breath 0 -> 5 scale with 5 being worst (score 6 If unable to do)    At rest 0.25 0   Simple tasks - showers, clothes change, eating, shaving 1 0   Household (dishes, doing bed, laundry) 1 0   Shopping 2 1.5   Walking level at own pace 1 0   Walking up Stairs 2.5 3   Total (40 - 48) Dyspnea Score 7.75 4.5   How bad is your cough? slight 1.5   How bad is your fatigue mld 1.5   nause  0   vomit  0   darrhea  0   anxiety  1   depressio  0       Simple office walk 185 feet x  3 laps goal with forehead probe 11/20/2017  06/01/2019  09/03/2019    O2 used Room air RA ra   Number laps completed 3 laps 3 laps -was goal. Stopped at 2 laps due to severe dyspnea 3 laps goal but only walked1.5 laps    Comments about pace Slow pace Slow pace avg pace   Resting Pulse Ox/HR 100% and 58/min 99% and 70.min 100% and 78/min   Final Pulse Ox/HR 97% and 88/min 93% and 110/min 92% and 104/min   Desaturated </= 88% no no  no   Desaturated <= 3% points yes Yes, 6 points Yes, 8 points   Got Tachycardic >/= 90/min no yes yes   Symptoms at end of test Mild dyspnea and back pain Moderate dyspnea and stopped prematurely Severe dyspnea, stopped prematureley   Miscellaneous comments Mild antalgic gait Back and calf pain too      PFT  PFT Results Latest Ref Rng & Units 09/23/2019 09/26/2015 03/02/2015 12/22/2014  FVC-Pre L 1.30 1.55 1.88 1.38  FVC-Predicted Pre % 48 54 65 47  FVC-Post L 1.31 - 1.82 1.53  FVC-Predicted Post % 48 - 63 53  Pre FEV1/FVC % % 86 83 79 86  Post FEV1/FCV % % 87 - 84 88  FEV1-Pre L 1.13 1.28 1.48 1.19   FEV1-Predicted Pre % 56 59 68 55  FEV1-Post L 1.15 - 1.54 1.35  DLCO uncorrected ml/min/mmHg 9.24 - 16.90 17.07  DLCO UNC% % 46 - 62 63  DLCO corrected ml/min/mmHg 9.24 - - -  DLCO COR %Predicted % 46 - - -  DLVA Predicted % 101 - 103 111  TLC L - - 3.84 4.04  TLC % Predicted % - - 71 75  RV % Predicted % - - 83 89       has a past medical history of Anemia, Anxiety, Atrial fibrillation (HCC), Bronchiectasis, CHF (congestive heart failure) (HCC), COPD (chronic obstructive pulmonary disease) (HCC), Cricopharyngeal achalasia, Depression, Diverticulosis, Dyslipidemia, Eczema, GERD with stricture, Glaucoma, H. 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   reports that she has never smoked. She has never used smokeless tobacco.  Past Surgical History:  Procedure Laterality Date  . BOWEL RESECTION N/A 02/08/2020   Procedure: SMALL BOWEL RESECTION;  Surgeon: Donnie Mesa, MD;  Location: [facility];  Service: General;  Laterality: N/A;  . BREAST SURGERY     br bx  . CARDIAC CATHETERIZATION  07/01/2013  . CATARACT EXTRACTION     both  . COLONOSCOPY    . ESOPHAGOSCOPY W/ BOTOX INJECTION  12/11/2011   Procedure: ESOPHAGOSCOPY WITH BOTOX INJECTION;  Surgeon: Rozetta Nunnery, MD;  Location: [facility];  Service: ENT;  Laterality: N/A;  esophogoscopy with dilation, botox injection  . FOOT SURGERY  03/14/2011   gastroc slide-rt  . GASTROSTOMY N/A 02/08/2020   Procedure: INSERTION OF GASTROSTOMY TUBE;  Surgeon: Donnie Mesa, MD;  Location: [facility];  Service: General;  Laterality: N/A;  . HIP ARTHROPLASTY Right 10/28/2019   Procedure: ARTHROPLASTY BIPOLAR HIP (HEMIARTHROPLASTY);  Surgeon: Marybelle Killings, MD;  Location[facility];  Service: Orthopedics;  Laterality: Right;  . LAPAROTOMY N/A 02/08/2020   Procedure: EXPLORATORY LAPAROTOMY;  Surgeon:  Donnie Mesa, MD;  Location: [facility];  Service: General;  Laterality: N/A;  . LYSIS OF ADHESION N/A 02/08/2020   Procedure: LYSIS OF ADHESIONS;  Surgeon: Donnie Mesa, MD;  Location: [facility];  Service: General;  Laterality: N/A;  . TOTAL ABDOMINAL HYSTERECTOMY    . TOTAL HIP REVISION Right 11/16/2019   Procedure: TOTAL HIP REVISION BIPOLAR TO CEMENTED BIPOLAR;  Surgeon: Marybelle Killings, MD;  Location: [facility];  Service: Orthopedics;  Laterality: Right;  . WISDOM TOOTH EXTRACTION      Allergies  Allergen Reactions  . Doxycycline Other (See Comments)    Rash on face and neck  . Hydrocodone     Other reaction(s): hallucinations  . Vicodin [Hydrocodone-Acetaminophen]     Hallucinations   .  Flagyl [Metronidazole] Nausea And Vomiting  . Moxifloxacin Other (See Comments)     Weakness/fatigue  . Pantoprazole Sodium Rash  . Penicillins Rash    Has patient had a PCN reaction causing immediate rash, facial/tongue/throat swelling, SOB or lightheadedness with hypotension:unsure Has patient had a PCN reaction causing severe rash involving mucus membranes or skin necrosis:unsure Has patient had a PCN reaction that required hospitalization:No Has patient had a PCN reaction occurring within the last 10 years:Yes Cannot recall exact reaction due to time lapse If all of the above answers are "NO", then may proceed with Cephalosporin use.  Other reaction(s): Unknown    Immunization History  Administered Date(s) Administered  . Influenza Split 04/06/2011, 04/03/2012  . Influenza Whole 06/01/2009, 04/03/2010  . Influenza, High Dose Seasonal PF 04/04/2017, 03/06/2018, 03/03/2019  . Influenza,inj,Quad PF,6+ Mos 03/10/2013, 03/24/2015  . Influenza-Unspecified 04/01/2014  . Pneumococcal Polysaccharide-23 07/04/2006    Family History  Problem Relation Age of Onset  . Hypertension Mother   . Stroke Mother   . Emphysema Brother   . Other Father        miner's lung  . Cancer Father        Lung  . Colon  polyps Sister   . Pancreatic cancer Sister   . Kidney disease Sister   . Atrial fibrillation Other        siblings     Current Outpatient Medications:  .  acetaminophen (TYLENOL) 325 MG tablet, Take 2 tablets (650 mg total) by mouth every 6 (six) hours as needed for mild pain (or Fever >/= 101)., Disp: , Rfl:  .  apixaban (ELIQUIS) 2.5 MG TABS tablet, Take 1 tablet (2.5 mg total) by mouth 2 (two) times daily., Disp: 180 tablet, Rfl: 3 .  Biotin 5000 MCG TABS, Take 5,000 mcg by mouth daily., Disp: , Rfl:  .  budesonide-formoterol (SYMBICORT) 80-4.5 MCG/ACT inhaler, Inhale 2 puffs into the lungs 2 (two) times daily., Disp: 10.2 g, Rfl: 3 .  Cholecalciferol (VITAMIN D-3) 125 MCG (5000 UT) TABS, Take 5,000 Units by mouth daily., Disp: 30 tablet, Rfl: 0 .  diclofenac Sodium (VOLTAREN) 1 % GEL, Apply 2 g topically 4 (four) times daily., Disp: 350 g, Rfl: 1 .  FLUoxetine (PROZAC) 20 MG capsule, Take 1 capsule by mouth daily., Disp: , Rfl:  .  furosemide (LASIX) 20 MG tablet, Take 20 mg by mouth daily., Disp: , Rfl:  .  hydrOXYzine (ATARAX/VISTARIL) 25 MG tablet, Take 12.5-25 mg by mouth every 8 (eight) hours as needed., Disp: , Rfl:  .  levothyroxine (SYNTHROID) 50 MCG tablet, Take 1 tablet (50 mcg total) by mouth daily., Disp: 90 tablet, Rfl: 3 .  polyethylene glycol (MIRALAX / GLYCOLAX) 17 g packet, Take 17 g by mouth daily., Disp: 14 each, Rfl: 0 .  pravastatin (PRAVACHOL) 40 MG tablet, Take 1 tablet (40 mg total) by mouth at bedtime., Disp: 30 tablet, Rfl: 0 .  vitamin B-12 (CYANOCOBALAMIN) 1000 MCG tablet, Take 1,000 mcg by mouth 3 (three) times a week. MWF, Disp: , Rfl:  .  zinc gluconate 50 MG tablet, Take 50 mg by mouth daily., Disp: , Rfl:  .  ondansetron (ZOFRAN) 8 MG tablet, Take 8 mg by mouth 3 (three) times daily.  (Patient not taking: Reported on 10/20/2020), Disp: , Rfl:       Objective:   Vitals:   10/20/20 1138  BP: 122/86  Pulse: 65  Temp: 97.6 F (36.4 C)  TempSrc:  Temporal  SpO2: 97%  Weight: 155 lb (70.3 kg)  Height: 5\' 6"  (1.676 m)    Estimated body mass index is 25.02 kg/m as calculated from the following:   Height as of this encounter: 5\' 6"  (1.676 m).   Weight as of this encounter: 155 lb (70.3 kg).  @WEIGHTCHANGE @  [employer]   10/20/20 1138  Weight: 155 lb (70.3 kg)     Physical Exam Frail lady.  Has a walker.  Has bilateral crackles and in the lung bases has wheezes in the upper lobe is more crackles.  Is slower with her movement.  Cognitively intact but has slower memory recall than a year ago.  Abdomen soft heart sounds are normal.     Assessment:       ICD-10-CM   1. ILD (interstitial lung disease) ([facility])  J84.9   2. Nocturnal hypoxemia  G47.34        Plan:     Patient Instructions     ICD-10-CM   1. ILD (interstitial lung disease) ([place])  J84.9   2. Nocturnal hypoxemia  G47.34      The interstitial lung disease that has been stable for many years is now worse in the  1 year -2020 through CT Jan 2021 there is new inflammation esp in upper lobes. 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  Possible is some worse since early 2021 but definitely not worse because you are not on o2  Unclear what your current night o2 needs are are  Too frail for prednisome or anti-fibrotic therapy after events of 2021 - so hold off on bronch  Plan -  -retest for ONO on room air  - Do HRCT supine and prone  - do spiro and dlco - continue to get physically stronger - continue symbicort as before  Followup - next 4-8 weeks but after completing agove   ( Level 05 visit: Estb 40-54 min in  visit type: on-site physical face to visit  in total care time and counseling or/and coordination of care by this undersigned MD - Dr Brand Males. This includes one or more of the following on this same day 10/20/2020: pre-charting, chart review, note writing, documentation discussion of test results, diagnostic or  treatment recommendations, prognosis, risks and benefits of management options, instructions, education, compliance or risk-factor reduction. It excludes time spent by the [facility] or office staff in the care of the patient. Actual time 50 min)   SIGNATURE    Dr. Brand Males, M.D., F.C.C.P,  Pulmonary and Critical Care Medicine Staff Physician, [place] Director - Interstitial Lung Disease  Program  [facility] [place] [facility] [place], [place], [address]  Pager: [phone], If no answer or between  15:00h - 7:00h: call [phone] Telephone: [phone]  12:24 PM 10/20/2020

## 2020-10-20 NOTE — Patient Instructions (Addendum)
ICD-10-CM   1. ILD (interstitial lung disease) ([place])  J84.9   2. Nocturnal hypoxemia  G47.34      The interstitial lung disease that has been stable for many years is now worse in the  1 year -2020 through CT Jan 2021 there is new inflammation esp in upper lobes. 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  Possible is some worse since early 2021 but definitely not worse because you are not on o2  Unclear what your current night o2 needs are are  Too frail for prednisome or anti-fibrotic therapy after events of 2021 - so hold off on bronch  Plan -  -retest for ONO on room air  - Do HRCT supine and prone  - do spiro and dlco - continue to get physically stronger - continue symbicort as before  Followup - next 4-8 weeks but after completing agove

## 2020-10-20 NOTE — Addendum Note (Signed)
Addended by: Geanie Logan on: 10/20/2020 12:44 PM   Modules accepted: Orders

## 2020-10-21 DIAGNOSIS — J479 Bronchiectasis, uncomplicated: Secondary | ICD-10-CM | POA: Diagnosis not present

## 2020-10-21 DIAGNOSIS — I5032 Chronic diastolic (congestive) heart failure: Secondary | ICD-10-CM | POA: Diagnosis not present

## 2020-10-21 DIAGNOSIS — I482 Chronic atrial fibrillation, unspecified: Secondary | ICD-10-CM | POA: Diagnosis not present

## 2020-10-21 DIAGNOSIS — J849 Interstitial pulmonary disease, unspecified: Secondary | ICD-10-CM | POA: Diagnosis not present

## 2020-10-21 DIAGNOSIS — I5020 Unspecified systolic (congestive) heart failure: Secondary | ICD-10-CM | POA: Diagnosis not present

## 2020-10-21 DIAGNOSIS — I110 Hypertensive heart disease with heart failure: Secondary | ICD-10-CM | POA: Diagnosis not present

## 2020-10-25 ENCOUNTER — Ambulatory Visit (INDEPENDENT_AMBULATORY_CARE_PROVIDER_SITE_OTHER): Payer: Medicare Other | Admitting: Internal Medicine

## 2020-10-25 ENCOUNTER — Encounter: Payer: Self-pay | Admitting: Internal Medicine

## 2020-10-25 ENCOUNTER — Other Ambulatory Visit: Payer: Self-pay

## 2020-10-25 VITALS — BP 141/65 | HR 90 | Ht 66.0 in | Wt 156.4 lb

## 2020-10-25 DIAGNOSIS — I10 Essential (primary) hypertension: Secondary | ICD-10-CM

## 2020-10-25 DIAGNOSIS — I482 Chronic atrial fibrillation, unspecified: Secondary | ICD-10-CM | POA: Diagnosis not present

## 2020-10-25 DIAGNOSIS — J479 Bronchiectasis, uncomplicated: Secondary | ICD-10-CM | POA: Diagnosis not present

## 2020-10-25 DIAGNOSIS — I110 Hypertensive heart disease with heart failure: Secondary | ICD-10-CM | POA: Diagnosis not present

## 2020-10-25 DIAGNOSIS — I5020 Unspecified systolic (congestive) heart failure: Secondary | ICD-10-CM | POA: Diagnosis not present

## 2020-10-25 DIAGNOSIS — J849 Interstitial pulmonary disease, unspecified: Secondary | ICD-10-CM | POA: Diagnosis not present

## 2020-10-25 DIAGNOSIS — I5032 Chronic diastolic (congestive) heart failure: Secondary | ICD-10-CM | POA: Diagnosis not present

## 2020-10-25 DIAGNOSIS — I4821 Permanent atrial fibrillation: Secondary | ICD-10-CM

## 2020-10-25 NOTE — Progress Notes (Signed)
Cardiology Office Note:    Date:  10/25/2020   ID:  Stephanie Cortez, DOB 1933/10/20, MRN [id number]  PCP:  [name], MD  Cardiologist:  Virl Axe, MD  Electrophysiologist:  None   Referring MD: [name], MD   Chief Complaint/Reason for Referral: Afib, diastolic HF  History of Present Illness:    Stephanie Cortez is a 87 y.o. female with a history of atrial fibrillation, ILD, hyperlipidemia, hypertension, hypothyroidism.  Today, she is accompanied by her daughter who also provides some history. Last year, she underwent 3 surgeries within five months. Prior to this she was independently living. Now she is dependent on using a walker. She is frustrated at not being to do what she could before and wants to work to be independent again. Her at home blood pressure averages in the 120s when the [facility] nurse visits twice a week.   She is interested continued physical therapy, and I suggested some options that may work for her. She also has questions about taking Lasix, as well as palliative care which was recommended by Dr. Tamala Julian. In addition, driving is a concern, she would like to drive again and be fully independent.   The patient denies chest pain, chest pressure, dyspnea at rest or with exertion, palpitations, PND, orthopnea, or leg swelling. Denies cough, fever, chills. Denies nausea, vomiting. Denies syncope or presyncope. Denies dizziness or lightheadedness.  She reports her sodium has been low lately, and we discussed limiting free water intake. I do not have current lab values to advise further.   Her regimen includes pravastatin for cholesterol and Eliquis. She is currently waiting to hear results from a sleep test, and waiting to schedule a lung function test.   Past Medical History:  Diagnosis Date  . Anemia    - Hgb 9.7gm% on 07/13/2008 in [place] -  Hgg 129gm% wiht normal irone levsl and ferritin 10/27/2008 in [facility] Recurrent otitis/sinusitis  . Anxiety     chronic BZ prn  . Atrial fibrillation (HCC)    chronic anticoag  . Bronchiectasis    >PFT 07/13/2008 in [place] 1.9L/76%, FVC 2.45L/74, Ratio 79, TLC 121%, DLCO 64%  AE BRonchiectasis - Dec 2010.New Rx:  outpatient - Feb 2011 - Rx outpatient  . CHF (congestive heart failure) ([facility])   . COPD (chronic obstructive pulmonary disease) (HCC)    bronchiectasis  . Cricopharyngeal achalasia   . Depression   . Diverticulosis   . Dyslipidemia   . Eczema   . GERD with stricture   . Glaucoma   . H. pylori infection   . Hypertension   . Hyponatremia    chronic, s/p endo eval 06/2012  . Hypothyroid   . IBS (irritable bowel syndrome)   . Lumbar disc disease   . Neuropathy of both feet   . Segmental colitis ([place])   . Vertigo   . Wears glasses     Past Surgical History:  Procedure Laterality Date  . BOWEL RESECTION N/A 02/08/2020   Procedure: SMALL BOWEL RESECTION;  Surgeon: Donnie Mesa, MD;  Location: [facility];  Service: General;  Laterality: N/A;  . BREAST SURGERY     br bx  . CARDIAC CATHETERIZATION  07/01/2013  . CATARACT EXTRACTION     both  . COLONOSCOPY    . ESOPHAGOSCOPY W/ BOTOX INJECTION  12/11/2011   Procedure: ESOPHAGOSCOPY WITH BOTOX INJECTION;  Surgeon: Rozetta Nunnery, MD;  Location: [facility];  Service: ENT;  Laterality: N/A;  esophogoscopy with dilation, botox injection  . FOOT SURGERY  03/14/2011   gastroc slide-rt  . GASTROSTOMY N/A 02/08/2020   Procedure: INSERTION OF GASTROSTOMY TUBE;  Surgeon: Donnie Mesa, MD;  Location: [facility];  Service: General;  Laterality: N/A;  . HIP ARTHROPLASTY Right 10/28/2019   Procedure: ARTHROPLASTY BIPOLAR HIP (HEMIARTHROPLASTY);  Surgeon: Marybelle Killings, MD;  [facility];  Service: Orthopedics;  Laterality: Right;  . LAPAROTOMY N/A 02/08/2020   Procedure: EXPLORATORY LAPAROTOMY;  Surgeon: Donnie Mesa, MD;  Location: [facility];  Service: General;  Laterality: N/A;  . LYSIS OF ADHESION N/A 02/08/2020   Procedure: LYSIS  OF ADHESIONS;  Surgeon: Donnie Mesa, MD;  Location: [facility];  Service: General;  Laterality: N/A;  . TOTAL ABDOMINAL HYSTERECTOMY    . TOTAL HIP REVISION Right 11/16/2019   Procedure: TOTAL HIP REVISION BIPOLAR TO CEMENTED BIPOLAR;  Surgeon: Marybelle Killings, MD;  Location: [facility];  Service: Orthopedics;  Laterality: Right;  . WISDOM TOOTH EXTRACTION      Current Medications: Current Meds  Medication Sig  . acetaminophen (TYLENOL) 325 MG tablet Take 2 tablets (650 mg total) by mouth every 6 (six) hours as needed for mild pain (or Fever >/= 101).  [name] Kitchen apixaban (ELIQUIS) 2.5 MG TABS tablet Take 1 tablet (2.5 mg total) by mouth 2 (two) times daily.  . Biotin 5000 MCG TABS Take 5,000 mcg by mouth daily.  . budesonide-formoterol (SYMBICORT) 80-4.5 MCG/ACT inhaler Inhale 2 puffs into the lungs 2 (two) times daily.  . Cholecalciferol (VITAMIN D-3) 125 MCG (5000 UT) TABS Take 5,000 Units by mouth daily.  . diclofenac Sodium (VOLTAREN) 1 % GEL Apply 2 g topically 4 (four) times daily.  [name] Kitchen FLUoxetine (PROZAC) 20 MG capsule Take 1 capsule by mouth daily.  . furosemide (LASIX) 20 MG tablet Take 20 mg by mouth daily.  . hydrOXYzine (ATARAX/VISTARIL) 25 MG tablet Take 12.5-25 mg by mouth every 8 (eight) hours as needed.  [name] Kitchen levothyroxine (SYNTHROID) 50 MCG tablet Take 1 tablet (50 mcg total) by mouth daily.  . ondansetron (ZOFRAN) 8 MG tablet Take 8 mg by mouth 3 (three) times daily.  . polyethylene glycol (MIRALAX / GLYCOLAX) 17 g packet Take 17 g by mouth daily.  . pravastatin (PRAVACHOL) 40 MG tablet Take 1 tablet (40 mg total) by mouth at bedtime.  . vitamin B-12 (CYANOCOBALAMIN) 1000 MCG tablet Take 1,000 mcg by mouth 3 (three) times a week. MWF  . zinc gluconate 50 MG tablet Take 50 mg by mouth daily.     Allergies:   Doxycycline, Hydrocodone, Vicodin [hydrocodone-acetaminophen], Flagyl [metronidazole], Moxifloxacin, Pantoprazole sodium, and Penicillins   Social History   Tobacco Use  . Smoking  status: Never Smoker  . Smokeless tobacco: Never Used  Vaping Use  . Vaping Use: Never used  Substance Use Topics  . Alcohol use: No  . Drug use: No     Family History: The patient's family history includes Atrial fibrillation in an other family member; Cancer in her father; Colon polyps in her sister; Emphysema in her brother; Hypertension in her mother; Kidney disease in her sister; Other in her father; Pancreatic cancer in her sister; Stroke in her mother.  ROS:   Please see the history of present illness.    All other systems reviewed and are negative.  EKGs/Labs/Other Studies Reviewed:    The following studies were reviewed today:  EKG:   10/25/2020: Afib. Rate 90 bpm. RBBB.  Lower Venous DVT Study 08/01/2020: Summary:  RIGHT:  -  There is no evidence of deep vein thrombosis in the lower extremity.  However, portions of this examination were limited- see technologist  comments above.    - No cystic structure found in the popliteal fossa.    LEFT:  - There is no evidence of deep vein thrombosis in the lower extremity.    - A cystic structure is found in the popliteal fossa.    Recent Labs: 02/09/2020: TSH 2.376 02/12/2020: Magnesium 1.7 02/18/2020: ALT 10 02/29/2020: BUN 10; Creatinine, Ser 0.53; Hemoglobin 9.6; Platelets 407; Potassium 3.8; Sodium 134  Recent Lipid Panel    Component Value Date/Time   CHOL 197 10/07/2012 0000   TRIG 78 10/07/2012 0000   HDL 88 (A) 10/07/2012 0000   CHOLHDL 2 10/08/2011 1046   VLDL 15.0 10/08/2011 1046   LDLCALC 93 10/07/2012 0000    Physical Exam:    VS:  BP (!) 141/65   Pulse 90   Ht 5\' 6"  (1.676 m)   Wt 156 lb 6.4 oz (70.9 kg)   LMP  (LMP Unknown)   SpO2 97%   BMI 25.24 kg/m     Wt Readings from Last 5 Encounters:  10/25/20 156 lb 6.4 oz (70.9 kg)  10/20/20 155 lb (70.3 kg)  10/03/20 156 lb 9.6 oz (71 kg)  08/01/20 164 lb 3.2 oz (74.5 kg)  06/20/20 156 lb (70.8 kg)    Constitutional: No acute distress Eyes:  sclera non-icteric, normal conjunctiva and lids ENMT: normal dentition, moist mucous membranes Cardiovascular: irregular rhythm, normal rate, no murmurs. S1 and S2 normal. Radial pulses normal bilaterally. No jugular venous distention.  Respiratory: diffuse inspiratory wheeze, fine rhonchi GI : normal bowel sounds, soft and nontender. No distention.   MSK: extremities warm, well perfused. No edema. LLE wrapped. NEURO: grossly nonfocal exam, moves all extremities. PSYCH: alert and oriented x 3, normal mood and affect.   ASSESSMENT:    1. Permanent atrial fibrillation ([place])   2. Chronic diastolic heart failure ([place])   3. Essential hypertension    PLAN:    Permanent atrial fibrillation (HCC) - continue eliquis, tolerating well.  - rate controlled without medical therapy.  Chronic diastolic heart failure (HCC) - Mild LE edema otherwise no significant concerns. Discussed free water restriction for report of hyponatremia.  Essential hypertension - Plan: EKG 12-Lead - overall well controlled, not on antihypertensive therapy. Has been experiencing some balance issues and wants to get stronger, at this point would not recommend additional antihypertensive therapy.   Total time of encounter: 60 minutes total time of encounter, including 45 minutes spent in face-to-face patient care on the date of this encounter. This time includes coordination of care and counseling regarding above mentioned problem list. Remainder of non-face-to-face time involved reviewing chart documents/testing relevant to the patient encounter and documentation in the medical record. I have independently reviewed documentation from referring provider.   Cherlynn Kaiser, MD, [facility]   Medication Adjustments/Labs and Tests Ordered: Current medicines are reviewed at length with the patient today.  Concerns regarding medicines are outlined above.   Orders Placed This Encounter  Procedures  . EKG  12-Lead    No orders of the defined types were placed in this encounter.   Patient Instructions  Medication Instructions:  Your physician recommends that you continue on your current medications as directed. Please refer to the Current Medication list given to you today.  *If you need a refill on your cardiac medications before your next appointment, please  call your pharmacy*  Follow-Up: At [facility], you and your health needs are our priority.  As part of our continuing mission to provide you with exceptional heart care, we have created designated Provider Care Teams.  These Care Teams include your primary Cardiologist (physician) and Advanced Practice Providers (APPs -  Physician Assistants and Nurse Practitioners) who all work together to provide you with the care you need, when you need it.  We recommend signing up for the patient portal called "MyChart".  Sign up information is provided on this After Visit Summary.  MyChart is used to connect with patients for Virtual Visits (Telemedicine).  Patients are able to view lab/test results, encounter notes, upcoming appointments, etc.  Non-urgent messages can be sent to your provider as well.   To learn more about what you can do with MyChart, go to [email or web address].    Your next appointment:   12 month(s)  The format for your next appointment:   In Person  Provider:   Cherlynn Kaiser, MD       [facility],acting as a scribe for Elouise Munroe, MD.,have documented all relevant documentation on the behalf of Elouise Munroe, MD,as directed by  Elouise Munroe, MD while in the presence of Elouise Munroe, MD.  I, Elouise Munroe, MD, have reviewed all documentation for this visit. The documentation on 10/30/20 for the exam, diagnosis, procedures, and orders are all accurate and complete.

## 2020-10-25 NOTE — Patient Instructions (Signed)
Medication Instructions:  Your physician recommends that you continue on your current medications as directed. Please refer to the Current Medication list given to you today.  *If you need a refill on your cardiac medications before your next appointment, please call your pharmacy*   Follow-Up: At [facility], you and your health needs are our priority.  As part of our continuing mission to provide you with exceptional heart care, we have created designated Provider Care Teams.  These Care Teams include your primary Cardiologist (physician) and Advanced Practice Providers (APPs -  Physician Assistants and Nurse Practitioners) who all work together to provide you with the care you need, when you need it.  We recommend signing up for the patient portal called "MyChart".  Sign up information is provided on this After Visit Summary.  MyChart is used to connect with patients for Virtual Visits (Telemedicine).  Patients are able to view lab/test results, encounter notes, upcoming appointments, etc.  Non-urgent messages can be sent to your provider as well.   To learn more about what you can do with MyChart, go to [email or web address].    Your next appointment:   12 month(s)  The format for your next appointment:   In Person  Provider:   Gayatri Acharya, MD 

## 2020-10-26 ENCOUNTER — Ambulatory Visit (INDEPENDENT_AMBULATORY_CARE_PROVIDER_SITE_OTHER): Payer: Medicare Other | Admitting: Otolaryngology

## 2020-10-26 ENCOUNTER — Encounter (INDEPENDENT_AMBULATORY_CARE_PROVIDER_SITE_OTHER): Payer: Self-pay | Admitting: Otolaryngology

## 2020-10-26 VITALS — Temp 96.3°F

## 2020-10-26 DIAGNOSIS — J849 Interstitial pulmonary disease, unspecified: Secondary | ICD-10-CM | POA: Diagnosis not present

## 2020-10-26 DIAGNOSIS — I5020 Unspecified systolic (congestive) heart failure: Secondary | ICD-10-CM | POA: Diagnosis not present

## 2020-10-26 DIAGNOSIS — I110 Hypertensive heart disease with heart failure: Secondary | ICD-10-CM | POA: Diagnosis not present

## 2020-10-26 DIAGNOSIS — J310 Chronic rhinitis: Secondary | ICD-10-CM

## 2020-10-26 DIAGNOSIS — I482 Chronic atrial fibrillation, unspecified: Secondary | ICD-10-CM | POA: Diagnosis not present

## 2020-10-26 DIAGNOSIS — I5032 Chronic diastolic (congestive) heart failure: Secondary | ICD-10-CM | POA: Diagnosis not present

## 2020-10-26 DIAGNOSIS — J479 Bronchiectasis, uncomplicated: Secondary | ICD-10-CM | POA: Diagnosis not present

## 2020-10-26 NOTE — Progress Notes (Signed)
HPI: Stephanie Cortez is a 87 y.o. female who returns today for evaluation of excessive phlegm in her throat.  She feels like it takes longer to get the food down when she swallows.  She complains mostly of postnasal drainage and excessive phlegm in her throat..  Past Medical History:  Diagnosis Date  . Anemia    - Hgb 9.7gm% on 07/13/2008 in [place] -  Hgg 129gm% wiht normal irone levsl and ferritin 10/27/2008 in [facility] Recurrent otitis/sinusitis  . Anxiety    chronic BZ prn  . Atrial fibrillation (HCC)    chronic anticoag  . Bronchiectasis    >PFT 07/13/2008 in [facility] 1.9L/76%, FVC 2.45L/74, Ratio 79, TLC 121%, DLCO 64%  AE BRonchiectasis - Dec 2010.New Rx:  outpatient - Feb 2011 - Rx outpatient  . CHF (congestive heart failure) ([facility])   . COPD (chronic obstructive pulmonary disease) (HCC)    bronchiectasis  . Cricopharyngeal achalasia   . Depression   . Diverticulosis   . Dyslipidemia   . Eczema   . GERD with stricture   . Glaucoma   . H. pylori infection   . Hypertension   . Hyponatremia    chronic, s/p endo eval 06/2012  . Hypothyroid   . IBS (irritable bowel syndrome)   . Lumbar disc disease   . Neuropathy of both feet   . Segmental colitis ([facility])   . Vertigo   . Wears glasses    Past Surgical History:  Procedure Laterality Date  . BOWEL RESECTION N/A 02/08/2020   Procedure: SMALL BOWEL RESECTION;  Surgeon: Donnie Mesa, MD;  Location: [facility];  Service: General;  Laterality: N/A;  . BREAST SURGERY     br bx  . CARDIAC CATHETERIZATION  07/01/2013  . CATARACT EXTRACTION     both  . COLONOSCOPY    . ESOPHAGOSCOPY W/ BOTOX INJECTION  12/11/2011   Procedure: ESOPHAGOSCOPY WITH BOTOX INJECTION;  Surgeon: Rozetta Nunnery, MD;  Location: [facility];  Service: ENT;  Laterality: N/A;  esophogoscopy with dilation, botox injection  . FOOT SURGERY  03/14/2011   gastroc slide-rt  . GASTROSTOMY N/A 02/08/2020   Procedure: INSERTION OF GASTROSTOMY TUBE;  Surgeon:  Donnie Mesa, MD;  Location: [facility];  Service: General;  Laterality: N/A;  . HIP ARTHROPLASTY Right 10/28/2019   Procedure: ARTHROPLASTY BIPOLAR HIP (HEMIARTHROPLASTY);  Surgeon: Marybelle Killings, MD;  Location[facility];  Service: Orthopedics;  Laterality: Right;  . LAPAROTOMY N/A 02/08/2020   Procedure: EXPLORATORY LAPAROTOMY;  Surgeon: Donnie Mesa, MD;  Location: [facility];  Service: General;  Laterality: N/A;  . LYSIS OF ADHESION N/A 02/08/2020   Procedure: LYSIS OF ADHESIONS;  Surgeon: Donnie Mesa, MD;  Location: [facility];  Service: General;  Laterality: N/A;  . TOTAL ABDOMINAL HYSTERECTOMY    . TOTAL HIP REVISION Right 11/16/2019   Procedure: TOTAL HIP REVISION BIPOLAR TO CEMENTED BIPOLAR;  Surgeon: Marybelle Killings, MD;  Location: [facility];  Service: Orthopedics;  Laterality: Right;  . WISDOM TOOTH EXTRACTION     Social History   Socioeconomic History  . Marital status: Married    Spouse name: Not on file  . Number of children: 2  . Years of education: Not on file  . Highest education level: Not on file  Occupational History  . Occupation: retired Product manager: [employer]  Tobacco Use  . Smoking status: Never Smoker  . Smokeless tobacco: Never Used  Vaping Use  . Vaping Use: Never  used  Substance and Sexual Activity  . Alcohol use: No  . Drug use: No  . Sexual activity: Not on file    Comment: Hysterectomy  Other Topics Concern  . Not on file  Social History Narrative   2 children 4 grandchildren   Recently moved to [place] from [place]   Retired Pharmacist, hospital   Grew up in [place]. Moved to [place]. Moved to [place] Feb 2010 due to duaghther living in [place]. Son lives in [place], [place]. Husband works as Scientist, physiological in [place].   Social Determinants of Health   Financial Resource Strain: Not on file  Food Insecurity: Not on file  Transportation Needs: Not on file  Physical Activity: Not on file  Stress: Not on file  Social Connections: Not on file    Family History  Problem Relation Age of Onset  . Hypertension Mother   . Stroke Mother   . Emphysema Brother   . Other Father        miner's lung  . Cancer Father        Lung  . Colon polyps Sister   . Pancreatic cancer Sister   . Kidney disease Sister   . Atrial fibrillation Other        siblings   Allergies  Allergen Reactions  . Doxycycline Other (See Comments)    Rash on face and neck  . Hydrocodone     Other reaction(s): hallucinations  . Vicodin [Hydrocodone-Acetaminophen]     Hallucinations   . Flagyl [Metronidazole] Nausea And Vomiting  . Moxifloxacin Other (See Comments)     Weakness/fatigue  . Pantoprazole Sodium Rash  . Penicillins Rash    Has patient had a PCN reaction causing immediate rash, facial/tongue/throat swelling, SOB or lightheadedness with hypotension:unsure Has patient had a PCN reaction causing severe rash involving mucus membranes or skin necrosis:unsure Has patient had a PCN reaction that required hospitalization:No Has patient had a PCN reaction occurring within the last 10 years:Yes Cannot recall exact reaction due to time lapse If all of the above answers are "NO", then may proceed with Cephalosporin use.  Other reaction(s): Unknown   Prior to Admission medications   Medication Sig Start Date End Date Taking? Authorizing Provider  acetaminophen (TYLENOL) 325 MG tablet Take 2 tablets (650 mg total) by mouth every 6 (six) hours as needed for mild pain (or Fever >/= 101). 03/01/20   Angiulli, Lavon Paganini, PA-C  apixaban (ELIQUIS) 2.5 MG TABS tablet Take 1 tablet (2.5 mg total) by mouth 2 (two) times daily. 10/04/20   Allred, Jeneen Rinks, MD  Biotin 5000 MCG TABS Take 5,000 mcg by mouth daily.    [provider]  budesonide-formoterol (SYMBICORT) 80-4.5 MCG/ACT inhaler Inhale 2 puffs into the lungs 2 (two) times daily. 03/01/20   Angiulli, Lavon Paganini, PA-C  Cholecalciferol (VITAMIN D-3) 125 MCG (5000 UT) TABS Take 5,000 Units by mouth daily. 03/01/20    Angiulli, Lavon Paganini, PA-C  diclofenac Sodium (VOLTAREN) 1 % GEL Apply 2 g topically 4 (four) times daily. 06/20/20   Jamse Arn, MD  FLUoxetine (PROZAC) 20 MG capsule Take 1 capsule by mouth daily. 08/22/20   [provider]  furosemide (LASIX) 20 MG tablet Take 20 mg by mouth daily. 03/16/20   [provider]  hydrOXYzine (ATARAX/VISTARIL) 25 MG tablet Take 12.5-25 mg by mouth every 8 (eight) hours as needed. 10/07/20   [provider]  levothyroxine (SYNTHROID) 50 MCG tablet Take 1 tablet (50 mcg total)  by mouth daily. 03/01/20   Angiulli, Lavon Paganini, PA-C  ondansetron (ZOFRAN) 8 MG tablet Take 8 mg by mouth 3 (three) times daily. 03/02/20   [provider]  polyethylene glycol (MIRALAX / GLYCOLAX) 17 g packet Take 17 g by mouth daily. 02/18/20   Hosie Poisson, MD  pravastatin (PRAVACHOL) 40 MG tablet Take 1 tablet (40 mg total) by mouth at bedtime. 03/01/20   Angiulli, Lavon Paganini, PA-C  vitamin B-12 (CYANOCOBALAMIN) 1000 MCG tablet Take 1,000 mcg by mouth 3 (three) times a week. MWF    [provider]  zinc gluconate 50 MG tablet Take 50 mg by mouth daily.    [provider]     Positive ROS: Otherwise negative  All other systems have been reviewed and were otherwise negative with the exception of those mentioned in the HPI and as above.  Physical Exam: Constitutional: Alert, well-appearing, no acute distress Ears: External ears without lesions or tenderness. Ear canals are clear bilaterally with intact, clear TMs.  Nasal: External nose without lesions. Septum is slightly deviated to the right.  Minimal rhinitis with only clear mucus discharge.  Both middle meatus regions were clear..  She is breathing comfortably through her nose. Oral: Lips and gums without lesions. Tongue and palate mucosa without lesions. Posterior oropharynx clear.  Indirect laryngoscopy revealed clear base of tongue vallecula and epiglottis.  Vocal cords were clear with  normal vocal mobility.  No excessive supraglottic mucus noted.  Piriform sinuses were clear. Neck: No palpable adenopathy or masses. Respiratory: Breathing comfortably  Skin: No facial/neck lesions or rash noted.  Procedures  Assessment: Chronic rhinitis with postnasal drainage. Clear hypopharynx and larynx on indirect laryngoscopy.  Plan: Recommended use of Flonase 2 sprays each nostril at night as well as saline rinse during the day as needed postnasal drainage. Reassured patient as well as her daughter clear hypopharynx and larynx on examination. She will follow-up as needed.   Radene Journey, MD

## 2020-10-28 DIAGNOSIS — I5020 Unspecified systolic (congestive) heart failure: Secondary | ICD-10-CM | POA: Diagnosis not present

## 2020-10-28 DIAGNOSIS — J479 Bronchiectasis, uncomplicated: Secondary | ICD-10-CM | POA: Diagnosis not present

## 2020-10-28 DIAGNOSIS — J849 Interstitial pulmonary disease, unspecified: Secondary | ICD-10-CM | POA: Diagnosis not present

## 2020-10-28 DIAGNOSIS — I482 Chronic atrial fibrillation, unspecified: Secondary | ICD-10-CM | POA: Diagnosis not present

## 2020-10-28 DIAGNOSIS — I5032 Chronic diastolic (congestive) heart failure: Secondary | ICD-10-CM | POA: Diagnosis not present

## 2020-10-28 DIAGNOSIS — I110 Hypertensive heart disease with heart failure: Secondary | ICD-10-CM | POA: Diagnosis not present

## 2020-10-29 DIAGNOSIS — K449 Diaphragmatic hernia without obstruction or gangrene: Secondary | ICD-10-CM | POA: Diagnosis not present

## 2020-10-29 DIAGNOSIS — K5792 Diverticulitis of intestine, part unspecified, without perforation or abscess without bleeding: Secondary | ICD-10-CM | POA: Diagnosis not present

## 2020-10-29 DIAGNOSIS — I482 Chronic atrial fibrillation, unspecified: Secondary | ICD-10-CM | POA: Diagnosis not present

## 2020-10-29 DIAGNOSIS — H409 Unspecified glaucoma: Secondary | ICD-10-CM | POA: Diagnosis not present

## 2020-10-29 DIAGNOSIS — I5032 Chronic diastolic (congestive) heart failure: Secondary | ICD-10-CM | POA: Diagnosis not present

## 2020-10-29 DIAGNOSIS — J479 Bronchiectasis, uncomplicated: Secondary | ICD-10-CM | POA: Diagnosis not present

## 2020-10-29 DIAGNOSIS — F419 Anxiety disorder, unspecified: Secondary | ICD-10-CM | POA: Diagnosis not present

## 2020-10-29 DIAGNOSIS — L97921 Non-pressure chronic ulcer of unspecified part of left lower leg limited to breakdown of skin: Secondary | ICD-10-CM | POA: Diagnosis not present

## 2020-10-29 DIAGNOSIS — E039 Hypothyroidism, unspecified: Secondary | ICD-10-CM | POA: Diagnosis not present

## 2020-10-29 DIAGNOSIS — K220 Achalasia of cardia: Secondary | ICD-10-CM | POA: Diagnosis not present

## 2020-10-29 DIAGNOSIS — I872 Venous insufficiency (chronic) (peripheral): Secondary | ICD-10-CM | POA: Diagnosis not present

## 2020-10-29 DIAGNOSIS — F32A Depression, unspecified: Secondary | ICD-10-CM | POA: Diagnosis not present

## 2020-10-29 DIAGNOSIS — Z7951 Long term (current) use of inhaled steroids: Secondary | ICD-10-CM | POA: Diagnosis not present

## 2020-10-29 DIAGNOSIS — G8929 Other chronic pain: Secondary | ICD-10-CM | POA: Diagnosis not present

## 2020-10-29 DIAGNOSIS — Z8719 Personal history of other diseases of the digestive system: Secondary | ICD-10-CM | POA: Diagnosis not present

## 2020-10-29 DIAGNOSIS — M1990 Unspecified osteoarthritis, unspecified site: Secondary | ICD-10-CM | POA: Diagnosis not present

## 2020-10-29 DIAGNOSIS — I5020 Unspecified systolic (congestive) heart failure: Secondary | ICD-10-CM | POA: Diagnosis not present

## 2020-10-29 DIAGNOSIS — I110 Hypertensive heart disease with heart failure: Secondary | ICD-10-CM | POA: Diagnosis not present

## 2020-10-29 DIAGNOSIS — E440 Moderate protein-calorie malnutrition: Secondary | ICD-10-CM | POA: Diagnosis not present

## 2020-10-29 DIAGNOSIS — E785 Hyperlipidemia, unspecified: Secondary | ICD-10-CM | POA: Diagnosis not present

## 2020-10-29 DIAGNOSIS — G629 Polyneuropathy, unspecified: Secondary | ICD-10-CM | POA: Diagnosis not present

## 2020-10-29 DIAGNOSIS — J849 Interstitial pulmonary disease, unspecified: Secondary | ICD-10-CM | POA: Diagnosis not present

## 2020-10-29 DIAGNOSIS — K219 Gastro-esophageal reflux disease without esophagitis: Secondary | ICD-10-CM | POA: Diagnosis not present

## 2020-10-29 DIAGNOSIS — M5186 Other intervertebral disc disorders, lumbar region: Secondary | ICD-10-CM | POA: Diagnosis not present

## 2020-10-29 DIAGNOSIS — Z7901 Long term (current) use of anticoagulants: Secondary | ICD-10-CM | POA: Diagnosis not present

## 2020-10-31 DIAGNOSIS — I872 Venous insufficiency (chronic) (peripheral): Secondary | ICD-10-CM | POA: Diagnosis not present

## 2020-10-31 DIAGNOSIS — I5020 Unspecified systolic (congestive) heart failure: Secondary | ICD-10-CM | POA: Diagnosis not present

## 2020-10-31 DIAGNOSIS — I5032 Chronic diastolic (congestive) heart failure: Secondary | ICD-10-CM | POA: Diagnosis not present

## 2020-10-31 DIAGNOSIS — I482 Chronic atrial fibrillation, unspecified: Secondary | ICD-10-CM | POA: Diagnosis not present

## 2020-10-31 DIAGNOSIS — L97921 Non-pressure chronic ulcer of unspecified part of left lower leg limited to breakdown of skin: Secondary | ICD-10-CM | POA: Diagnosis not present

## 2020-10-31 DIAGNOSIS — I110 Hypertensive heart disease with heart failure: Secondary | ICD-10-CM | POA: Diagnosis not present

## 2020-11-01 DIAGNOSIS — I482 Chronic atrial fibrillation, unspecified: Secondary | ICD-10-CM | POA: Diagnosis not present

## 2020-11-01 DIAGNOSIS — I5032 Chronic diastolic (congestive) heart failure: Secondary | ICD-10-CM | POA: Diagnosis not present

## 2020-11-01 DIAGNOSIS — I110 Hypertensive heart disease with heart failure: Secondary | ICD-10-CM | POA: Diagnosis not present

## 2020-11-01 DIAGNOSIS — I5020 Unspecified systolic (congestive) heart failure: Secondary | ICD-10-CM | POA: Diagnosis not present

## 2020-11-01 DIAGNOSIS — I872 Venous insufficiency (chronic) (peripheral): Secondary | ICD-10-CM | POA: Diagnosis not present

## 2020-11-01 DIAGNOSIS — L97921 Non-pressure chronic ulcer of unspecified part of left lower leg limited to breakdown of skin: Secondary | ICD-10-CM | POA: Diagnosis not present

## 2020-11-03 DIAGNOSIS — I110 Hypertensive heart disease with heart failure: Secondary | ICD-10-CM | POA: Diagnosis not present

## 2020-11-03 DIAGNOSIS — I5020 Unspecified systolic (congestive) heart failure: Secondary | ICD-10-CM | POA: Diagnosis not present

## 2020-11-03 DIAGNOSIS — I482 Chronic atrial fibrillation, unspecified: Secondary | ICD-10-CM | POA: Diagnosis not present

## 2020-11-03 DIAGNOSIS — I5032 Chronic diastolic (congestive) heart failure: Secondary | ICD-10-CM | POA: Diagnosis not present

## 2020-11-03 DIAGNOSIS — I872 Venous insufficiency (chronic) (peripheral): Secondary | ICD-10-CM | POA: Diagnosis not present

## 2020-11-03 DIAGNOSIS — L97921 Non-pressure chronic ulcer of unspecified part of left lower leg limited to breakdown of skin: Secondary | ICD-10-CM | POA: Diagnosis not present

## 2020-11-04 DIAGNOSIS — I482 Chronic atrial fibrillation, unspecified: Secondary | ICD-10-CM | POA: Diagnosis not present

## 2020-11-04 DIAGNOSIS — I872 Venous insufficiency (chronic) (peripheral): Secondary | ICD-10-CM | POA: Diagnosis not present

## 2020-11-04 DIAGNOSIS — I110 Hypertensive heart disease with heart failure: Secondary | ICD-10-CM | POA: Diagnosis not present

## 2020-11-04 DIAGNOSIS — I5032 Chronic diastolic (congestive) heart failure: Secondary | ICD-10-CM | POA: Diagnosis not present

## 2020-11-04 DIAGNOSIS — L97921 Non-pressure chronic ulcer of unspecified part of left lower leg limited to breakdown of skin: Secondary | ICD-10-CM | POA: Diagnosis not present

## 2020-11-04 DIAGNOSIS — I5020 Unspecified systolic (congestive) heart failure: Secondary | ICD-10-CM | POA: Diagnosis not present

## 2020-11-07 DIAGNOSIS — I872 Venous insufficiency (chronic) (peripheral): Secondary | ICD-10-CM | POA: Diagnosis not present

## 2020-11-07 DIAGNOSIS — I482 Chronic atrial fibrillation, unspecified: Secondary | ICD-10-CM | POA: Diagnosis not present

## 2020-11-07 DIAGNOSIS — I5020 Unspecified systolic (congestive) heart failure: Secondary | ICD-10-CM | POA: Diagnosis not present

## 2020-11-07 DIAGNOSIS — L97921 Non-pressure chronic ulcer of unspecified part of left lower leg limited to breakdown of skin: Secondary | ICD-10-CM | POA: Diagnosis not present

## 2020-11-07 DIAGNOSIS — I110 Hypertensive heart disease with heart failure: Secondary | ICD-10-CM | POA: Diagnosis not present

## 2020-11-07 DIAGNOSIS — I5032 Chronic diastolic (congestive) heart failure: Secondary | ICD-10-CM | POA: Diagnosis not present

## 2020-11-10 DIAGNOSIS — R21 Rash and other nonspecific skin eruption: Secondary | ICD-10-CM | POA: Diagnosis not present

## 2020-11-11 ENCOUNTER — Other Ambulatory Visit: Payer: Self-pay | Admitting: *Deleted

## 2020-11-11 DIAGNOSIS — I5020 Unspecified systolic (congestive) heart failure: Secondary | ICD-10-CM | POA: Diagnosis not present

## 2020-11-11 DIAGNOSIS — I872 Venous insufficiency (chronic) (peripheral): Secondary | ICD-10-CM | POA: Diagnosis not present

## 2020-11-11 DIAGNOSIS — I5032 Chronic diastolic (congestive) heart failure: Secondary | ICD-10-CM | POA: Diagnosis not present

## 2020-11-11 DIAGNOSIS — I110 Hypertensive heart disease with heart failure: Secondary | ICD-10-CM | POA: Diagnosis not present

## 2020-11-11 DIAGNOSIS — I482 Chronic atrial fibrillation, unspecified: Secondary | ICD-10-CM | POA: Diagnosis not present

## 2020-11-11 DIAGNOSIS — D649 Anemia, unspecified: Secondary | ICD-10-CM | POA: Diagnosis not present

## 2020-11-11 DIAGNOSIS — L97921 Non-pressure chronic ulcer of unspecified part of left lower leg limited to breakdown of skin: Secondary | ICD-10-CM | POA: Diagnosis not present

## 2020-11-14 DIAGNOSIS — I5020 Unspecified systolic (congestive) heart failure: Secondary | ICD-10-CM | POA: Diagnosis not present

## 2020-11-14 DIAGNOSIS — I482 Chronic atrial fibrillation, unspecified: Secondary | ICD-10-CM | POA: Diagnosis not present

## 2020-11-14 DIAGNOSIS — I5032 Chronic diastolic (congestive) heart failure: Secondary | ICD-10-CM | POA: Diagnosis not present

## 2020-11-14 DIAGNOSIS — I872 Venous insufficiency (chronic) (peripheral): Secondary | ICD-10-CM | POA: Diagnosis not present

## 2020-11-14 DIAGNOSIS — I110 Hypertensive heart disease with heart failure: Secondary | ICD-10-CM | POA: Diagnosis not present

## 2020-11-14 DIAGNOSIS — L97921 Non-pressure chronic ulcer of unspecified part of left lower leg limited to breakdown of skin: Secondary | ICD-10-CM | POA: Diagnosis not present

## 2020-11-16 DIAGNOSIS — M6281 Muscle weakness (generalized): Secondary | ICD-10-CM | POA: Diagnosis not present

## 2020-11-16 DIAGNOSIS — M792 Neuralgia and neuritis, unspecified: Secondary | ICD-10-CM | POA: Diagnosis not present

## 2020-11-16 DIAGNOSIS — I739 Peripheral vascular disease, unspecified: Secondary | ICD-10-CM | POA: Diagnosis not present

## 2020-11-16 DIAGNOSIS — B351 Tinea unguium: Secondary | ICD-10-CM | POA: Diagnosis not present

## 2020-11-17 ENCOUNTER — Encounter: Payer: Medicare Other | Admitting: Vascular Surgery

## 2020-11-17 ENCOUNTER — Ambulatory Visit (HOSPITAL_COMMUNITY): Payer: Medicare Other

## 2020-11-17 ENCOUNTER — Telehealth: Payer: Self-pay | Admitting: *Deleted

## 2020-11-17 DIAGNOSIS — R609 Edema, unspecified: Secondary | ICD-10-CM

## 2020-11-17 NOTE — Telephone Encounter (Signed)
Stephanie Cortez, [name]'s daughter called to see if Dr Posey Pronto could send another referral to Dr Einar Gip for her mom's feet swelling. They did not make it to the first time referral and Dr Irven Shelling office said just to send another one over.

## 2020-11-17 NOTE — Telephone Encounter (Signed)
Referral made again.  Thank you.

## 2020-11-17 NOTE — Telephone Encounter (Signed)
Notified. 

## 2020-11-18 DIAGNOSIS — I110 Hypertensive heart disease with heart failure: Secondary | ICD-10-CM | POA: Diagnosis not present

## 2020-11-18 DIAGNOSIS — I5032 Chronic diastolic (congestive) heart failure: Secondary | ICD-10-CM | POA: Diagnosis not present

## 2020-11-18 DIAGNOSIS — I872 Venous insufficiency (chronic) (peripheral): Secondary | ICD-10-CM | POA: Diagnosis not present

## 2020-11-18 DIAGNOSIS — I5020 Unspecified systolic (congestive) heart failure: Secondary | ICD-10-CM | POA: Diagnosis not present

## 2020-11-18 DIAGNOSIS — L97921 Non-pressure chronic ulcer of unspecified part of left lower leg limited to breakdown of skin: Secondary | ICD-10-CM | POA: Diagnosis not present

## 2020-11-18 DIAGNOSIS — I482 Chronic atrial fibrillation, unspecified: Secondary | ICD-10-CM | POA: Diagnosis not present

## 2020-11-22 DIAGNOSIS — I482 Chronic atrial fibrillation, unspecified: Secondary | ICD-10-CM | POA: Diagnosis not present

## 2020-11-22 DIAGNOSIS — I110 Hypertensive heart disease with heart failure: Secondary | ICD-10-CM | POA: Diagnosis not present

## 2020-11-22 DIAGNOSIS — I5020 Unspecified systolic (congestive) heart failure: Secondary | ICD-10-CM | POA: Diagnosis not present

## 2020-11-22 DIAGNOSIS — L97921 Non-pressure chronic ulcer of unspecified part of left lower leg limited to breakdown of skin: Secondary | ICD-10-CM | POA: Diagnosis not present

## 2020-11-22 DIAGNOSIS — I5032 Chronic diastolic (congestive) heart failure: Secondary | ICD-10-CM | POA: Diagnosis not present

## 2020-11-22 DIAGNOSIS — I872 Venous insufficiency (chronic) (peripheral): Secondary | ICD-10-CM | POA: Diagnosis not present

## 2020-11-23 DIAGNOSIS — B351 Tinea unguium: Secondary | ICD-10-CM | POA: Diagnosis not present

## 2020-11-24 ENCOUNTER — Ambulatory Visit
Admission: RE | Admit: 2020-11-24 | Discharge: 2020-11-24 | Disposition: A | Discharge status: Home / Self Care | Payer: Medicare Other | Source: Ambulatory Visit | Attending: Internal Medicine | Admitting: Internal Medicine

## 2020-11-24 DIAGNOSIS — I872 Venous insufficiency (chronic) (peripheral): Secondary | ICD-10-CM | POA: Diagnosis not present

## 2020-11-24 DIAGNOSIS — R0602 Shortness of breath: Secondary | ICD-10-CM | POA: Diagnosis not present

## 2020-11-24 DIAGNOSIS — I110 Hypertensive heart disease with heart failure: Secondary | ICD-10-CM | POA: Diagnosis not present

## 2020-11-24 DIAGNOSIS — J849 Interstitial pulmonary disease, unspecified: Secondary | ICD-10-CM

## 2020-11-24 DIAGNOSIS — I5020 Unspecified systolic (congestive) heart failure: Secondary | ICD-10-CM | POA: Diagnosis not present

## 2020-11-24 DIAGNOSIS — I5032 Chronic diastolic (congestive) heart failure: Secondary | ICD-10-CM | POA: Diagnosis not present

## 2020-11-24 DIAGNOSIS — L97921 Non-pressure chronic ulcer of unspecified part of left lower leg limited to breakdown of skin: Secondary | ICD-10-CM | POA: Diagnosis not present

## 2020-11-24 DIAGNOSIS — I482 Chronic atrial fibrillation, unspecified: Secondary | ICD-10-CM | POA: Diagnosis not present

## 2020-11-25 DIAGNOSIS — I5032 Chronic diastolic (congestive) heart failure: Secondary | ICD-10-CM | POA: Diagnosis not present

## 2020-11-25 DIAGNOSIS — L97921 Non-pressure chronic ulcer of unspecified part of left lower leg limited to breakdown of skin: Secondary | ICD-10-CM | POA: Diagnosis not present

## 2020-11-25 DIAGNOSIS — I5020 Unspecified systolic (congestive) heart failure: Secondary | ICD-10-CM | POA: Diagnosis not present

## 2020-11-25 DIAGNOSIS — I482 Chronic atrial fibrillation, unspecified: Secondary | ICD-10-CM | POA: Diagnosis not present

## 2020-11-25 DIAGNOSIS — I872 Venous insufficiency (chronic) (peripheral): Secondary | ICD-10-CM | POA: Diagnosis not present

## 2020-11-25 DIAGNOSIS — I110 Hypertensive heart disease with heart failure: Secondary | ICD-10-CM | POA: Diagnosis not present

## 2020-11-28 DIAGNOSIS — I482 Chronic atrial fibrillation, unspecified: Secondary | ICD-10-CM | POA: Diagnosis not present

## 2020-11-28 DIAGNOSIS — E440 Moderate protein-calorie malnutrition: Secondary | ICD-10-CM | POA: Diagnosis not present

## 2020-11-28 DIAGNOSIS — H409 Unspecified glaucoma: Secondary | ICD-10-CM | POA: Diagnosis not present

## 2020-11-28 DIAGNOSIS — L97921 Non-pressure chronic ulcer of unspecified part of left lower leg limited to breakdown of skin: Secondary | ICD-10-CM | POA: Diagnosis not present

## 2020-11-28 DIAGNOSIS — F419 Anxiety disorder, unspecified: Secondary | ICD-10-CM | POA: Diagnosis not present

## 2020-11-28 DIAGNOSIS — Z7951 Long term (current) use of inhaled steroids: Secondary | ICD-10-CM | POA: Diagnosis not present

## 2020-11-28 DIAGNOSIS — I110 Hypertensive heart disease with heart failure: Secondary | ICD-10-CM | POA: Diagnosis not present

## 2020-11-28 DIAGNOSIS — I872 Venous insufficiency (chronic) (peripheral): Secondary | ICD-10-CM | POA: Diagnosis not present

## 2020-11-28 DIAGNOSIS — G8929 Other chronic pain: Secondary | ICD-10-CM | POA: Diagnosis not present

## 2020-11-28 DIAGNOSIS — J479 Bronchiectasis, uncomplicated: Secondary | ICD-10-CM | POA: Diagnosis not present

## 2020-11-28 DIAGNOSIS — M5186 Other intervertebral disc disorders, lumbar region: Secondary | ICD-10-CM | POA: Diagnosis not present

## 2020-11-28 DIAGNOSIS — I5020 Unspecified systolic (congestive) heart failure: Secondary | ICD-10-CM | POA: Diagnosis not present

## 2020-11-28 DIAGNOSIS — K220 Achalasia of cardia: Secondary | ICD-10-CM | POA: Diagnosis not present

## 2020-11-28 DIAGNOSIS — Z8719 Personal history of other diseases of the digestive system: Secondary | ICD-10-CM | POA: Diagnosis not present

## 2020-11-28 DIAGNOSIS — M1990 Unspecified osteoarthritis, unspecified site: Secondary | ICD-10-CM | POA: Diagnosis not present

## 2020-11-28 DIAGNOSIS — E039 Hypothyroidism, unspecified: Secondary | ICD-10-CM | POA: Diagnosis not present

## 2020-11-28 DIAGNOSIS — I5032 Chronic diastolic (congestive) heart failure: Secondary | ICD-10-CM | POA: Diagnosis not present

## 2020-11-28 DIAGNOSIS — J849 Interstitial pulmonary disease, unspecified: Secondary | ICD-10-CM | POA: Diagnosis not present

## 2020-11-28 DIAGNOSIS — Z7901 Long term (current) use of anticoagulants: Secondary | ICD-10-CM | POA: Diagnosis not present

## 2020-11-28 DIAGNOSIS — K219 Gastro-esophageal reflux disease without esophagitis: Secondary | ICD-10-CM | POA: Diagnosis not present

## 2020-11-28 DIAGNOSIS — E785 Hyperlipidemia, unspecified: Secondary | ICD-10-CM | POA: Diagnosis not present

## 2020-11-28 DIAGNOSIS — F32A Depression, unspecified: Secondary | ICD-10-CM | POA: Diagnosis not present

## 2020-11-28 DIAGNOSIS — K5792 Diverticulitis of intestine, part unspecified, without perforation or abscess without bleeding: Secondary | ICD-10-CM | POA: Diagnosis not present

## 2020-11-28 DIAGNOSIS — G629 Polyneuropathy, unspecified: Secondary | ICD-10-CM | POA: Diagnosis not present

## 2020-11-28 DIAGNOSIS — K449 Diaphragmatic hernia without obstruction or gangrene: Secondary | ICD-10-CM | POA: Diagnosis not present

## 2020-11-29 DIAGNOSIS — I482 Chronic atrial fibrillation, unspecified: Secondary | ICD-10-CM | POA: Diagnosis not present

## 2020-11-29 DIAGNOSIS — I110 Hypertensive heart disease with heart failure: Secondary | ICD-10-CM | POA: Diagnosis not present

## 2020-11-29 DIAGNOSIS — I5032 Chronic diastolic (congestive) heart failure: Secondary | ICD-10-CM | POA: Diagnosis not present

## 2020-11-29 DIAGNOSIS — L97921 Non-pressure chronic ulcer of unspecified part of left lower leg limited to breakdown of skin: Secondary | ICD-10-CM | POA: Diagnosis not present

## 2020-11-29 DIAGNOSIS — I872 Venous insufficiency (chronic) (peripheral): Secondary | ICD-10-CM | POA: Diagnosis not present

## 2020-11-29 DIAGNOSIS — I5020 Unspecified systolic (congestive) heart failure: Secondary | ICD-10-CM | POA: Diagnosis not present

## 2020-11-30 DIAGNOSIS — I5020 Unspecified systolic (congestive) heart failure: Secondary | ICD-10-CM | POA: Diagnosis not present

## 2020-11-30 DIAGNOSIS — I482 Chronic atrial fibrillation, unspecified: Secondary | ICD-10-CM | POA: Diagnosis not present

## 2020-11-30 DIAGNOSIS — I110 Hypertensive heart disease with heart failure: Secondary | ICD-10-CM | POA: Diagnosis not present

## 2020-11-30 DIAGNOSIS — I872 Venous insufficiency (chronic) (peripheral): Secondary | ICD-10-CM | POA: Diagnosis not present

## 2020-11-30 DIAGNOSIS — L97921 Non-pressure chronic ulcer of unspecified part of left lower leg limited to breakdown of skin: Secondary | ICD-10-CM | POA: Diagnosis not present

## 2020-11-30 DIAGNOSIS — I5032 Chronic diastolic (congestive) heart failure: Secondary | ICD-10-CM | POA: Diagnosis not present

## 2020-12-02 DIAGNOSIS — I110 Hypertensive heart disease with heart failure: Secondary | ICD-10-CM | POA: Diagnosis not present

## 2020-12-02 DIAGNOSIS — I5032 Chronic diastolic (congestive) heart failure: Secondary | ICD-10-CM | POA: Diagnosis not present

## 2020-12-02 DIAGNOSIS — I5020 Unspecified systolic (congestive) heart failure: Secondary | ICD-10-CM | POA: Diagnosis not present

## 2020-12-02 DIAGNOSIS — L97921 Non-pressure chronic ulcer of unspecified part of left lower leg limited to breakdown of skin: Secondary | ICD-10-CM | POA: Diagnosis not present

## 2020-12-02 DIAGNOSIS — I482 Chronic atrial fibrillation, unspecified: Secondary | ICD-10-CM | POA: Diagnosis not present

## 2020-12-02 DIAGNOSIS — I872 Venous insufficiency (chronic) (peripheral): Secondary | ICD-10-CM | POA: Diagnosis not present

## 2020-12-05 ENCOUNTER — Telehealth: Payer: Self-pay | Admitting: Internal Medicine

## 2020-12-05 DIAGNOSIS — I5032 Chronic diastolic (congestive) heart failure: Secondary | ICD-10-CM | POA: Diagnosis not present

## 2020-12-05 DIAGNOSIS — I872 Venous insufficiency (chronic) (peripheral): Secondary | ICD-10-CM | POA: Diagnosis not present

## 2020-12-05 DIAGNOSIS — I5020 Unspecified systolic (congestive) heart failure: Secondary | ICD-10-CM | POA: Diagnosis not present

## 2020-12-05 DIAGNOSIS — I110 Hypertensive heart disease with heart failure: Secondary | ICD-10-CM | POA: Diagnosis not present

## 2020-12-05 DIAGNOSIS — L97921 Non-pressure chronic ulcer of unspecified part of left lower leg limited to breakdown of skin: Secondary | ICD-10-CM | POA: Diagnosis not present

## 2020-12-05 DIAGNOSIS — I482 Chronic atrial fibrillation, unspecified: Secondary | ICD-10-CM | POA: Diagnosis not present

## 2020-12-05 NOTE — Telephone Encounter (Signed)
ILD is progressive . Pleae give firsta vail followup Jun- Aug  Thanks  MR

## 2020-12-07 NOTE — Telephone Encounter (Signed)
Patient mentioned that she was to have a PFT - do you want that prior the next appt? -pr

## 2020-12-07 NOTE — Telephone Encounter (Signed)
No avail openings with MR in June or July. No August schedule yet.  Will keep encounter open but also routing to front desk pool to help get pt scheduled for an appt.  Pt is an ILD pt and needs to have 24min OV.

## 2020-12-09 ENCOUNTER — Encounter: Payer: Self-pay | Admitting: Internal Medicine

## 2020-12-09 ENCOUNTER — Other Ambulatory Visit: Payer: Self-pay

## 2020-12-09 NOTE — Telephone Encounter (Signed)
If she can doa  spiro.dlco before visit great. If not, will just do walk test. The CT shows progression. So Rx decision are - registry trial pariticipation. Anti fibotic and o2. We cnan manage this without PFT but I PFT is there great   / PFT Results Latest Ref Rng & Units 09/23/2019 09/26/2015 03/02/2015 12/22/2014  FVC-Pre L 1.30 1.55 1.88 1.38  FVC-Predicted Pre % 48 54 65 47  FVC-Post L 1.31 - 1.82 1.53  FVC-Predicted Post % 48 - 63 53  Pre FEV1/FVC % % 86 83 79 86  Post FEV1/FCV % % 87 - 84 88  FEV1-Pre L 1.13 1.28 1.48 1.19  FEV1-Predicted Pre % 56 59 68 55  FEV1-Post L 1.15 - 1.54 1.35  DLCO uncorrected ml/min/mmHg 9.24 - 16.90 17.07  DLCO UNC% % 46 - 62 63  DLCO corrected ml/min/mmHg 9.24 - - -  DLCO COR %Predicted % 46 - - -  DLVA Predicted % 101 - 103 111  TLC L - - 3.84 4.04  TLC % Predicted % - - 71 75  RV % Predicted % - - 83 89

## 2020-12-09 NOTE — Telephone Encounter (Signed)
Called and spoke with daughter Stephanie Cortez per DPR to let her know that her CT showed some progression in her ILD and that Dr. Chase Caller would like for her to do a breathing test before her visit. I was able to get patient scheduled on the same day. And her covid test is set up for 12/16/20. Daughter asked about overnight oximetry test from [facility]. I told her I would have to call them to get them to fax over the results. Called [facility] and was told that the person that had access to them was not there and they would have to get to it on Monday. Provided her with fax number and to put attention Dr. Chase Caller. Called daughter back to let her know. She expressed understanding. Nothing further needed at this time.

## 2020-12-13 DIAGNOSIS — I110 Hypertensive heart disease with heart failure: Secondary | ICD-10-CM | POA: Diagnosis not present

## 2020-12-13 DIAGNOSIS — L97921 Non-pressure chronic ulcer of unspecified part of left lower leg limited to breakdown of skin: Secondary | ICD-10-CM | POA: Diagnosis not present

## 2020-12-13 DIAGNOSIS — I5032 Chronic diastolic (congestive) heart failure: Secondary | ICD-10-CM | POA: Diagnosis not present

## 2020-12-13 DIAGNOSIS — I482 Chronic atrial fibrillation, unspecified: Secondary | ICD-10-CM | POA: Diagnosis not present

## 2020-12-13 DIAGNOSIS — I5020 Unspecified systolic (congestive) heart failure: Secondary | ICD-10-CM | POA: Diagnosis not present

## 2020-12-13 DIAGNOSIS — I872 Venous insufficiency (chronic) (peripheral): Secondary | ICD-10-CM | POA: Diagnosis not present

## 2020-12-15 ENCOUNTER — Ambulatory Visit (HOSPITAL_COMMUNITY)
Admission: RE | Admit: 2020-12-15 | Discharge: 2020-12-15 | Disposition: A | Discharge status: Home / Self Care | Payer: Medicare Other | Source: Ambulatory Visit | Attending: Vascular Surgery | Admitting: Vascular Surgery

## 2020-12-15 ENCOUNTER — Ambulatory Visit (INDEPENDENT_AMBULATORY_CARE_PROVIDER_SITE_OTHER): Payer: Medicare Other | Admitting: Physician Assistant

## 2020-12-15 ENCOUNTER — Other Ambulatory Visit: Payer: Self-pay

## 2020-12-15 VITALS — BP 149/90 | HR 103 | Temp 98.5°F | Resp 18 | Ht 66.0 in | Wt 157.1 lb

## 2020-12-15 DIAGNOSIS — I872 Venous insufficiency (chronic) (peripheral): Secondary | ICD-10-CM | POA: Diagnosis not present

## 2020-12-15 DIAGNOSIS — M7989 Other specified soft tissue disorders: Secondary | ICD-10-CM

## 2020-12-15 DIAGNOSIS — R609 Edema, unspecified: Secondary | ICD-10-CM | POA: Diagnosis not present

## 2020-12-15 NOTE — Progress Notes (Signed)
Requested by:  [name], [address] [place],  [place] [id number]  Reason for consultation: Venous insufficiency   History of Present Illness   Stephanie Cortez is a 87 y.o. (Jan 27, 1934) female who presents for evaluation of lower extremity swelling, erythema.  The patient is primarily concerned of itching and swelling of her lower extremities.  She is accompanied by her daughter.  She has no prior history of DVT.  From bilateral hip surgery and exploratory laparotomy.  She is undergoing physical therapy and ambulates with a rolling walker.  She wears 15 to 20 mmHg knee-high stockings and elevates her legs in a recliner.  Her daughter has photographs of her mother's legs some months ago that show significant edema and skin breakdown.  She was placed in Unna boots with resolution of skin changes.  She denies lower extremity pain due to walking or exercise and denies rest pain.  She denies fever or chills.  Evaluated for persistent swelling of the right leg with erythema by Dr. Scot Dock on January 21, 2019.  At the time of that evaluation, she had some reflux and small saphenous vein on the right and she was instructed on intermittent leg elevation and compression stockings.  No prior history of deep venous thrombosis or phlebitis. She is maintained on apixaban for paroxysmal atrial fibrillation. She is not diabetic. Of note, she had work-up for abdominal pain to include CT a of abdomen and pelvis in August 2021.  Small bowel ischemia was noted and favored to be secondary to mesenteric adhesions/torsion and not from thromboembolic arterial disease.  There was no significant mesenteric arterial disease.    Venous symptoms include: positive if (X) [ x ] aching [  x] heavy [ x ] tired  [ x ] throbbing [ x ] burning  [ x ] itching [ xx ]swelling [  ] bleeding [ x] ulcer (in the past) Onset/duration: Approximately 2+ years Occupation: Retired Aggravating factors: Sitting  and standing  alleviating factors: Elevation Compression: Yes helps: Yes Pain medications: Tylenol Previous vein procedures: No History of DVT: No  Past Medical History:  Diagnosis Date   Anemia    - Hgb 9.7gm% on 07/13/2008 in [facility] 129gm% wiht normal irone levsl and ferritin 10/27/2008 in [facility] Recurrent otitis/sinusitis   Anxiety    chronic BZ prn   Atrial fibrillation ([facility])    chronic anticoag   Bronchiectasis    >PFT 07/13/2008 in [facility] 1.9L/76%, FVC 2.45L/74, Ratio 79, TLC 121%, DLCO 64%  AE BRonchiectasis - Dec 2010.New Rx:  outpatient - Feb 2011 - Rx outpatient   CHF (congestive heart failure) ([facility])    COPD (chronic obstructive pulmonary disease) (HCC)    bronchiectasis   Cricopharyngeal achalasia    Depression    Diverticulosis    Dyslipidemia    Eczema    GERD with stricture    Glaucoma    H. 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[facility])    Vertigo    Wears glasses     Past Surgical History:  Procedure Laterality Date   BOWEL RESECTION N/A 02/08/2020   Procedure: SMALL BOWEL RESECTION;  Surgeon: Donnie Mesa, MD;  Location: [facility];  Service: General;  Laterality:  N/A;   BREAST SURGERY     br bx   CARDIAC CATHETERIZATION  07/01/2013   CATARACT EXTRACTION     both   COLONOSCOPY     ESOPHAGOSCOPY W/ BOTOX INJECTION  12/11/2011   Procedure: ESOPHAGOSCOPY WITH BOTOX INJECTION;  Surgeon: Rozetta Nunnery, MD;  Location: [facility];  Service: ENT;  Laterality: N/A;  esophogoscopy with dilation, botox injection   FOOT SURGERY  03/14/2011   gastroc slide-rt   GASTROSTOMY N/A 02/08/2020   Procedure: INSERTION OF GASTROSTOMY TUBE;  Surgeon: Donnie Mesa, MD;  Location: [facility];  Service: General;  Laterality: N/A;   HIP ARTHROPLASTY Right 10/28/2019   Procedure: ARTHROPLASTY  BIPOLAR HIP (HEMIARTHROPLASTY);  Surgeon: Marybelle Killings, MD;  Location[facility];  Service: Orthopedics;  Laterality: Right;   LAPAROTOMY N/A 02/08/2020   Procedure: EXPLORATORY LAPAROTOMY;  Surgeon: Donnie Mesa, MD;  Location: [facility];  Service: General;  Laterality: N/A;   LYSIS OF ADHESION N/A 02/08/2020   Procedure: LYSIS OF ADHESIONS;  Surgeon: Donnie Mesa, MD;  Location: [facility];  Service: General;  Laterality: N/A;   TOTAL ABDOMINAL HYSTERECTOMY     TOTAL HIP REVISION Right 11/16/2019   Procedure: TOTAL HIP REVISION BIPOLAR TO CEMENTED BIPOLAR;  Surgeon: Marybelle Killings, MD;  Location: [facility];  Service: Orthopedics;  Laterality: Right;   WISDOM TOOTH EXTRACTION      Social History   Socioeconomic History   Marital status: Married    Spouse name: Not on file   Number of children: 2   Years of education: Not on file   Highest education level: Not on file  Occupational History   Occupation: retired Product manager: [employer]  Tobacco Use   Smoking status: Never   Smokeless tobacco: Never  Vaping Use   Vaping Use: Never used  Substance and Sexual Activity   Alcohol use: No   Drug use: No   Sexual activity: Not on file    Comment: Hysterectomy  Other Topics Concern   Not on file  Social History Narrative   2 children 4 grandchildren   Recently moved to [place] from [place]   Retired Pharmacist, hospital   Grew up in [place]. Moved to [place]. Moved to [place] Feb 2010 due to duaghther living in [place]. Son lives in [place], [place]. Husband works as Scientist, physiological in [place].   Social Determinants of Health   Financial Resource Strain: Not on file  Food Insecurity: Not on file  Transportation Needs: Not on file  Physical Activity: Not on file  Stress: Not on file  Social Connections: Not on file  Intimate Partner Violence: Not on file    Family History  Problem Relation Age of Onset   Hypertension Mother    Stroke Mother    Emphysema Brother    Other Father         miner's lung   Cancer Father        Lung   Colon polyps Sister    Pancreatic cancer Sister    Kidney disease Sister    Atrial fibrillation Other        siblings    Current Outpatient Medications  Medication Sig Dispense Refill   acetaminophen (TYLENOL) 325 MG tablet Take 2 tablets (650 mg total) by mouth every 6 (six) hours as needed for mild pain (or Fever >/= 101).     apixaban (ELIQUIS) 2.5 MG TABS tablet Take 1 tablet (2.5 mg total) by mouth  2 (two) times daily. 180 tablet 3   Biotin 5000 MCG TABS Take 5,000 mcg by mouth daily.     budesonide-formoterol (SYMBICORT) 80-4.5 MCG/ACT inhaler Inhale 2 puffs into the lungs 2 (two) times daily. 10.2 g 3   Cholecalciferol (VITAMIN D-3) 125 MCG (5000 UT) TABS Take 5,000 Units by mouth daily. 30 tablet 0   diclofenac Sodium (VOLTAREN) 1 % GEL Apply 2 g topically 4 (four) times daily. 350 g 1   FLUoxetine (PROZAC) 20 MG capsule Take 1 capsule by mouth daily.     furosemide (LASIX) 20 MG tablet Take 20 mg by mouth daily.     hydrOXYzine (ATARAX/VISTARIL) 25 MG tablet Take 12.5-25 mg by mouth every 8 (eight) hours as needed.     levothyroxine (SYNTHROID) 50 MCG tablet Take 1 tablet (50 mcg total) by mouth daily. 90 tablet 3   ondansetron (ZOFRAN) 8 MG tablet Take 8 mg by mouth 3 (three) times daily.     polyethylene glycol (MIRALAX / GLYCOLAX) 17 g packet Take 17 g by mouth daily. 14 each 0   pravastatin (PRAVACHOL) 40 MG tablet Take 1 tablet (40 mg total) by mouth at bedtime. 30 tablet 0   vitamin B-12 (CYANOCOBALAMIN) 1000 MCG tablet Take 1,000 mcg by mouth 3 (three) times a week. MWF     zinc gluconate 50 MG tablet Take 50 mg by mouth daily.     No current facility-administered medications for this visit.    Allergies  Allergen Reactions   Doxycycline Other (See Comments)    Rash on face and neck   Hydrocodone     Other reaction(s): hallucinations   Vicodin [Hydrocodone-Acetaminophen]     Hallucinations    Flagyl  [Metronidazole] Nausea And Vomiting   Moxifloxacin Other (See Comments)     Weakness/fatigue   Pantoprazole Sodium Rash   Penicillins Rash    Has patient had a PCN reaction causing immediate rash, facial/tongue/throat swelling, SOB or lightheadedness with hypotension:unsure Has patient had a PCN reaction causing severe rash involving mucus membranes or skin necrosis:unsure Has patient had a PCN reaction that required hospitalization:No Has patient had a PCN reaction occurring within the last 10 years:Yes Cannot recall exact reaction due to time lapse If all of the above answers are "NO", then may proceed with Cephalosporin use.  Other reaction(s): Unknown    REVIEW OF SYSTEMS (negative unless checked):   Cardiac:  []  Chest pain or chest pressure? []  Shortness of breath upon activity? []  Shortness of breath when lying flat? [x]  Irregular heart rhythm?  Vascular:  []  Pain in calf, thigh, or hip brought on by walking? []  Pain in feet at night that wakes you up from your sleep? []  Blood clot in your veins? [x]  Leg swelling?  Pulmonary:  []  Oxygen at home? []  Productive cough? []  Wheezing?  Neurologic:  []  Sudden weakness in arms or legs? []  Sudden numbness in arms or legs? []  Sudden onset of difficult speaking or slurred speech? []  Temporary loss of vision in one eye? []  Problems with dizziness?  Gastrointestinal:  []  Blood in stool? []  Vomited blood?  Genitourinary:  []  Burning when urinating? []  Blood in urine?  Psychiatric:  []  Major depression  Hematologic:  []  Bleeding problems? []  Problems with blood clotting?  Dermatologic:  []  Rashes or ulcers?  Constitutional:  []  Fever or chills?  Ear/Nose/Throat:  []  Change in hearing? []  Nose bleeds? []  Sore throat?  Musculoskeletal:  []  Back pain? [x]  Joint pain? []  Muscle pain?  Physical Examination     Vitals:   12/15/20 1344  Weight: 157 lb 1.6 oz (71.3 kg)  Height: 5\' 6"  (1.676 m)      General:  WDWN in NAD; vital signs documented above Gait: Not observed HENT: WNL, normocephalic Pulmonary: normal non-labored breathing , without Rales, rhonchi,  wheezing Cardiac: regular HR,  Skin: with rashes>> erythema of the lower legs in gaiter distribution Vascular Exam/Pulses: 2+ dorsalis pedis pulses bilaterally.  Capillary refill is less than 2 seconds Extremities: without varicose veins, without reticular veins, with edema, with stasis pigmentation, with lipodermatosclerosis, without ulcers Musculoskeletal: no muscle wasting or atrophy  Neurologic: A&O X 3;  No focal weakness or paresthesias are detected Psychiatric:  The pt has Normal affect.  Non-invasive Vascular Imaging   BLE Venous Insufficiency Duplex  12/15/2020 Bilateral:  - No evidence of deep vein thrombosis seen in the lower extremities,  bilaterally, from the common femoral through the popliteal veins.  - No evidence of superficial venous thrombosis in the lower extremities,  bilaterally.     Right:  - Venous reflux is noted in the right sapheno-femoral junction.  - Venous reflux is noted in the right greater saphenous vein in the thigh.  - Venous reflux is noted in the right greater saphenous vein in the calf.  - Venous reflux is noted in the right short saphenous vein.      Left:  - Venous reflux is noted in the left sapheno-femoral junction.  - Venous reflux is noted in the left greater saphenous vein in the thigh.  - Venous reflux is noted in the left greater saphenous vein in the calf.  - Venous reflux is noted in the left short saphenous vein.      Medical Decision Making   MARYAMA KURIAKOSE is a 87 y.o. female who presents with: BLE chronic venous insufficiency, BLE venous reflux.  She has bilateral greater saphenous vein on small saphenous vein reflux, however these veins are not sufficiently dilated to enable laser ablation therapy.  There is no evidence of deep venous thrombosis or superficial  venous thrombosis of the lower extremities.  She has palpable pulses and no evidence of arterial insufficiency.  Based on the patient's history and examination, I recommend: Compression stockings, elevation in the proper position, routine exercise (in particular water aerobics), avoid prolonged sitting or standing.  Patient in regards to this. I discussed with the patient the use of 20-30 mm knee high compression stockings  I discussed with the patient and her daughter should she develop venous ulcers, would recommend referral to home health or wound care clinic for Unna boot dressings. Thank you for allowing [place] to participate in this patient's care.   Barbie Banner, PA-C [facility] Office: [phone]  12/15/2020, 1:38 PM  Clinic MD: Dr. Oneida Alar

## 2020-12-16 ENCOUNTER — Telehealth: Payer: Self-pay | Admitting: Internal Medicine

## 2020-12-16 ENCOUNTER — Other Ambulatory Visit (HOSPITAL_COMMUNITY)
Admission: RE | Admit: 2020-12-16 | Discharge: 2020-12-16 | Disposition: A | Discharge status: Home / Self Care | Payer: Medicare Other | Source: Ambulatory Visit | Attending: Internal Medicine | Admitting: Internal Medicine

## 2020-12-16 DIAGNOSIS — L97921 Non-pressure chronic ulcer of unspecified part of left lower leg limited to breakdown of skin: Secondary | ICD-10-CM | POA: Diagnosis not present

## 2020-12-16 DIAGNOSIS — I482 Chronic atrial fibrillation, unspecified: Secondary | ICD-10-CM | POA: Diagnosis not present

## 2020-12-16 DIAGNOSIS — Z01812 Encounter for preprocedural laboratory examination: Secondary | ICD-10-CM | POA: Diagnosis not present

## 2020-12-16 DIAGNOSIS — I5020 Unspecified systolic (congestive) heart failure: Secondary | ICD-10-CM | POA: Diagnosis not present

## 2020-12-16 DIAGNOSIS — I5032 Chronic diastolic (congestive) heart failure: Secondary | ICD-10-CM | POA: Diagnosis not present

## 2020-12-16 DIAGNOSIS — I110 Hypertensive heart disease with heart failure: Secondary | ICD-10-CM | POA: Diagnosis not present

## 2020-12-16 DIAGNOSIS — I872 Venous insufficiency (chronic) (peripheral): Secondary | ICD-10-CM | POA: Diagnosis not present

## 2020-12-16 DIAGNOSIS — Z20822 Contact with and (suspected) exposure to covid-19: Secondary | ICD-10-CM | POA: Diagnosis not present

## 2020-12-16 LAB — SARS CORONAVIRUS 2 (TAT 6-24 HRS): SARS Coronavirus 2: NEGATIVE

## 2020-12-16 NOTE — Telephone Encounter (Signed)
PT's daughter called in to confirm the name of the outpatient physical therapy center she referred her mother to go

## 2020-12-19 ENCOUNTER — Other Ambulatory Visit: Payer: Self-pay

## 2020-12-19 ENCOUNTER — Ambulatory Visit (INDEPENDENT_AMBULATORY_CARE_PROVIDER_SITE_OTHER): Payer: Medicare Other | Admitting: Internal Medicine

## 2020-12-19 ENCOUNTER — Encounter: Payer: Self-pay | Admitting: Internal Medicine

## 2020-12-19 VITALS — BP 128/70 | HR 110 | Ht 66.0 in | Wt 157.0 lb

## 2020-12-19 DIAGNOSIS — J849 Interstitial pulmonary disease, unspecified: Secondary | ICD-10-CM

## 2020-12-19 DIAGNOSIS — J679 Hypersensitivity pneumonitis due to unspecified organic dust: Secondary | ICD-10-CM

## 2020-12-19 LAB — PULMONARY FUNCTION TEST
DL/VA % pred: 99 %
DL/VA: 3.95 ml/min/mmHg/L
DLCO cor % pred: 39 %
DLCO cor: 7.79 ml/min/mmHg
DLCO unc % pred: 39 %
DLCO unc: 7.79 ml/min/mmHg
FEF 25-75 Pre: 1.24 L/sec
FEF2575-%Pred-Pre: 98 %
FEV1-%Pred-Pre: 48 %
FEV1-Pre: 0.95 L
FEV1FVC-%Pred-Pre: 121 %
FEV6-%Pred-Pre: 43 %
FEV6-Pre: 1.08 L
FEV6FVC-%Pred-Pre: 106 %
FVC-%Pred-Pre: 40 %
FVC-Pre: 1.08 L
Pre FEV1/FVC ratio: 88 %
Pre FEV6/FVC Ratio: 100 %

## 2020-12-19 NOTE — Progress Notes (Signed)
Spirometry/DLCO performed today. 
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
## 2020-12-19 NOTE — Patient Instructions (Signed)
Spirometry/DLCO performed today. 

## 2020-12-19 NOTE — Progress Notes (Addendum)
OV 03/24/2015  Chief Complaint  Patient presents with   Follow-up    Pt here after PFT in 01/2015. Pt using nocturnal O2 and has noticed a difference the next day in her breathing. Pt states she has a prod cough with discolored mucus, PND, sinus pressure.    Ms. Verne is here for follow-up with her daughter [name], they are presenting for review of ILD workup. I'd always followed up with her baseline diagnosis of bronchiectasis but it started becoming more apparent that she actually has ILD. 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 According to the radiologist it has been stable since 2014 at least. Autoimmune workup showed elevated angiotensin-converting enzyme and CK but otherwise normal. Since the diagnosis of elevated CK her primary care physician has stopped her statin. There is no follow-up CK on this. We also notices that she had nocturnal desaturations restarted her on oxygen and sent her to pulmonary rehabilitation. These 2 measures haven't helped her immensely. She still continues a Symbicort for the prior diagnosis of bronchiectasis. She is inclined to continue that. She will have a flu shot today. She wants to know more about interstitial lung disease and had lot of questions about it  OV  08/11/2015: Acute office visit:  Patient presents to the office today with complaint of chest congestion and cough with yellow mucus intermittent wheezing and shortness of breath that has been ongoing for about 2-3 weeks, but per patient this has worsened over the last 2-3 days. Patient denies fever, chills, chest pain or hemoptysis. Patient endorses scant bloody mucus from her nose, that she feels this is probably related to her nocturnal oxygen use. She presented to her primary care physician [name] at [facility] and was treated initially with a Z-Pak. When Z-Pak did not resolve patient's symptoms she was then treated with clarithromycin 500 mg twice daily  starting 07/25/2015 for a 10 day treatment period . She is currently still taking this antibiotic. States since starting the clarithromycin that she has noticed some improvement.    OV 09/26/2015  Chief Complaint  Patient presents with   Follow-up    Pt here after seeing SG on 08/10/15 for an acute visit. Pt here after 36mt and PFT. Pt states she is still not feeling well. Pt c/o headache, hoarseness, wheezing, prod cough. Pt denies increase in SOB and f/c/s.    Routine office visit for this lady with ILD/NSIP pattern (previous diagnosis of bronchiectasis and on Symbicort]. Has associated sinus problems. Her daughter [name] not with her today.   87year old female presents for follow-up. She is frustrated by her chronic sinus drainage and chronic sinus tension  in the frontal area. This is been going on for many months a few years. She seen Dr. [name] ENT and apparently is planning a procedure. In the last several days to a few weeks she's having increasing headache particularly in the last 3 days. However there is no increase in sinus drainage volume or change in color of the sinus drainage [baseline color is much reading between white and yellow and green]  However in the last day or 2 she's increasing cough and wheezing despite taking Symbicort.  She is frustrated by her symptoms. She wants a definite answer currently same time she is reluctant to have surgical lung biopsy. She is also reluctant to have chronic daily prednisone because of side effects. She is also at the same time frustrated  about her extreme level of curiosity and need to search the Internet about her disease condition and then this works her into an anxiety      Spirometry today 09/26/2015 shows FVC 1.55 L/54%, FEV1 1.28 L/59% and a ratio of 83. This comes 6 weeks of her short prednisone burst. This a 300 mL decline in FVC since August 2016 but an improvement by 170 mL since June 2016   6 minute walk test today: Walk  336 m. Resting pulse ox was 98%. Peak exertion pulse ox was 94%. She had back pain but not shortness of breath.  Last CT scan of the chest was June 2016 which showed NSIP pattern  Last CT scan of the sinuses August 2013 that showed mild mucosal edema in the paranasal sinuses    OV 01/04/2016  Chief Complaint  Patient presents with   Follow-up    Breathing has been doing well, same as usual.  Discuss oxygen, has not worn oxygen x 2 weeks.  patient only uses at night, discuss testing to get off oxygen.  Having surgery on spine in August, wants to discuss. Wheezing.    FU NSIP with air trapping and cough/wheeze - on symbicort (prior dx in 85)  - Routine fu. Had annual HRCT - stable ILD with air trapping x 2 years. Overall well. Does not want to use nocturnal o2. But having wheeze and cough that is stable but says symbicort is not helping. Also, reports saysShe is having spine surgery to the low spine in the next few months by Dr [name] and she wants know her preoperative pulmonary risk. She also wants to know the basis of her interstitial lung disease which we have discussed in the past but at this point in time her main issues symptoms. Walking desaturation test 185 feet 3 laps on room at in the office she did not desaturate   OV 02/20/2016  Chief Complaint  Patient presents with   Acute Visit    Pt c/o prod cough with yellow mucus with scant amount of blood, increase in SOB, wheezing. Pt states the spiriva makes her cough.    FU NSIP with air trapping and cough/wheeze - on symbicort (prior dx in 2015).  Last visit in July 2017 because of air trapping we took her off Symbicort and tried Spiriva and Brio she called on 02/17/2016 increasing cough. She was rotated back to Symbicort. She clearly attributes the worsening cough and symptoms to Spiriva. She feels that after going on Symbicort she is better. However she is having some yellow mucus and feels she is having acute bronchitis. She  has multiple drug allergies listed below. Regarding her interstitial lung disease this is been stable for 2 years. Last visit walking desaturation test did not desaturate. She then had overnight oxygen study 02/07/2016 and results are below and she does not need nocturnal oxygen. Therefore she will not need sleep study  OV 04/04/2017  Chief Complaint  Patient presents with   Follow-up    Pt states that she has been doing well. Breathing is at a stable point at this moment. Occ. cough and occ. SOB. Denies any CP.    Follow-up NSIP with a trapping and cough/wheeze and Symbicort responsive   83-year-ol overall stable. No new complaints. Not seen her in over a year. No interim health issues other than back pain for which she is avoiding back surgery. She is in need of a flu shot. She feels Symbicort is working well for her  and she wants to continue this. Without Symbicort she decompensated. Walking desaturation test 185 feet 3 laps on room air: Resting heart rate 63. Final 197. Resting pulse ox 100%. Final pulse ox 99%.   08/19/2017 acute extended ov/Wert re: acute cough in setting of flare of rhinitis? sinusitis Chief Complaint  Patient presents with   Acute Visit    wheezing,SOB with activity,productive cough with green, thick sputum, has started mucinex   nasal symptoms flared  X one week prior to OV with facial pressure/ nasal congestion  Then worse cough/ wheeze with mucus turning green, some epistaxis but no green nasal d/c Not normally needing any kind of rescue rx / does not even have one  symptoms Worse hs and off and on during  noct / props up on several pillows helps some Not limited by breathing from desired activities  But very sedentary  rec Omnicef 300 mg twice daily x 10 days Prednisone 10 mg take  4 each am x 2 days,   2 each am x 2 days,  1 each am x 2 days and stop  I emphasized that nasal steroids (flonase) ave no immediate benefit in terms of improving symptoms.  To help  them reached the target tissue, the patient should use Afrin two puffs every 12 hours  Work on perfecting inhaler technique:   - late add: should notify coumadin adviser re 10 day course of abx       08/27/2017  Acute extended  ov/Wert re: refractory cough/ wheeze Chief Complaint  Patient presents with   Acute Visit    cough and wheezing are unchanged since last visit. She is coughing up some green sputum- still taking omnicef.    Dyspnea:  No really sob Cough: less in volume, still green, some from nose/mostly from chest/ wakes her up each am / worse also when head hit pillow  Sleep: poorly / 45 degrees SABA use:  N/a Has flutter valve not suing   OV 11/20/2017    Chief Complaint  Patient presents with   Follow-up    Pt supposed to have PFT but was unable to show for it. Pt saw MW twice February 2019 due to a cough. Pt states her cough is now better. Has some mild SOB.    Follow-up NSIP with a trapping and cough/wheeze and Symbicort responsive    Follow-up interstitial lung disease associated with air trapping and Symbicort responsive.  She presents for follow-up.  Since I saw her last in October 2018 she presented 2 times in February 2019 acutely to this office and treated with Omnicef and prednisone and subsequently symptomatic treatment.  Followed up with nurse practitioner in March.  Pulmonary function test recommended for today's visit but because of delays in her previous appointment at the Coumadin clinic she could not get it done.  But at this point in time she feels her cough is resolved back to baseline she feels good.  She feels Symbicort is helping her a lot.  Walking desaturation test today is essentially close to normal other than mild desaturation to 3 points.  There are no new issues.   OV 03/06/2018  Subjective:  Patient ID: Jolene Provost, female , DOB: 25-Oct-1933 , age 37 y.o. , MRN: [id number] , ADDRESS: [place] [address]   03/06/2018 -    Chief Complaint  Patient presents with   Follow-up    Pt states she has been doing okay since last visit and is mainly  here today to discuss prognosis and wants more information abotu her diagnosis. Pt states she has not been sleeping well and states she still has the dry cough. Pt states SOB is stable.     HPI ALEKHYA GRAVLIN 87 y.o. -ast seen in May 2019. She was supposed to see her back in October 2019 with monitoring pulmonary function testing and high risk CT for her ILD not otherwise specified and associated possible cough variant asthma. Howevershe started thinking about this appointment and got extremely worried and anxious. She started looking at the Internet and she thought she might have IPF and she would die in the next few years.Therefore she is here to discuss this. Overall she's stable. She'll have a high dose flu shot. There are no new complaints. Back pain bothers her more This no worsening in dyspnea. There is no wheeze or cough     ROS - per HPI     OV 06/01/2019  Subjective:  Patient ID: Jolene Provost, female , DOB: 07-13-1933 , age 87 y.o. , MRN: [id number] , ADDRESS: [place] [address]   06/01/2019 -   Chief Complaint  Patient presents with   Follow-up    Pt states she has been doing okay since last visit. States she will become breathless faster than before.    Follow-up history of asthma and interstitial lung disease not otherwise specified  HPI Jolene Provost 87 y.o. -returns for follow-up.  Last seen September 2019.  At that point in time it was becoming more and more apparent that she had definite interstitial lung disease.  It was stable.  She was worried it might be IPF although the pattern did not fit.  Nevertheless she was stable.  I did recommend to her that she have a high-resolution CT chest and pulmonary function test as soon as possible and return in October 2019.  However she never did.  She does not have a satisfactory  answer as to why she did did not return.  It appears that was the end of 2019 her sister in [place] got ill and by July 21, 2018 she passed away.  Then COVID-19 pandemic here.  She says she has been social distancing at home.  Her daughter and visits with her.  She had a limited Thanksgiving with 6 people.  Everybody is doing well after Thanksgiving.  She has been masking as well.  Nevertheless she feels overall her shortness of breath is declined steadily in the last 1 year.  Symptom score is listed below.  In walking desaturation test she was only able to do 2 out of the 3 laps.  A year ago she was able to do all 3 laps.  Her pulse ox drop is worse than before.  She also has chronic cough and postnasal drainage and this is stable.  Most recent pulmonary function test and CT scan numbers are listed below.  Symptom score is listed below.       IMPRESSION: 1. Interstitial lung disease characterized by upper lobe predominant patchy peribronchovascular and subpleural reticulation and faint ground-glass attenuation with associated mild traction bronchiectasis. No frank honeycombing. Extensive air trapping. These findings have not appreciably changed and are most consistent with chronic hypersensitivity pneumonitis. These findings are not consistent with usual interstitial pneumonia (UIP). 2. Two vessel coronary atherosclerosis.  Stable mild cardiomegaly. 3. Aortic atherosclerosis.     Electronically Signed   By: Ilona Sorrel M.D.   On: 12/26/2015 15:44   Results  for OSIE, MERKIN (MRN [id number]) as of 06/01/2019 10:58  Ref. Range 12/22/2014 12:33  Anti Nuclear Antibody (ANA) Latest Ref Range: NEGATIVE  NEG  Angiotensin-Converting Enzyme Latest Ref Range: 8 - 52 U/L 77 (H)  Cyclic Citrullin Peptide Ab Latest Ref Range: 0.0 - 5.0 U/mL <2.0  ds DNA Ab Latest Units: IU/mL <1  Myeloperoxidase Abs Latest Ref Range: <1.0 AI  <1.0  Serine Protease 3 Latest Ref Range: <1.0 AI  <1.0  RA Latex  Turbid. Latest Ref Range: <=14 IU/mL 13  Ribonucleic Protein(ENA) Antibody, IgG Latest Ref Range: <1.0 NEG AI  <1.0 NEG  SSA (Ro) (ENA) Antibody, IgG Latest Ref Range: <1.0 NEG AI  <1.0 NEG  SSB (La) (ENA) Antibody, IgG Latest Ref Range: <1.0 NEG AI  <1.0 NEG  Scleroderma (Scl-70) (ENA) Antibody, IgG Latest Ref Range: <1.0 NEG AI  <1.0 NEG     OV 09/03/2019  Subjective:  Patient ID: Jolene Provost, female , DOB: 11/16/1933 , age 35 y.o. , MRN: [id number] , ADDRESS: [place] [address]   09/03/2019 -   Chief Complaint  Patient presents with   Follow-up    Pt states she wears O2 at night which she states is helping. Pt will still become SOB with exertion.   Follow-up interstitial lung disease not otherwise specified. MDD discussion 08/11/2019: Very concerning for hypersensitive pneumonitis.  HPI APPOLLONIA KLEE 87 y.o. -last visit in our 2020 she reported worsening shortness of breath.  I was worried that her ILD was getting worse.  Therefore she did a high-resolution CT chest and it shows significant worsening especially with development of upper lobe groundglass opacities inflammatory findings with air trapping.  Very suggestive of hypersensitive pneumonitis.  She tells me that ever since moving from [place] to [place] she is lived in the same home for 10 years.  In the year of COVID-19 in 2020 she has been mostly homebound.  There is mold in the basement and the basement is damp but she never goes there.  So she is puzzled about these findings.   [facility] Comprehensive ILD Questionnaire  Symptoms:  -Insidious onset of shortness of breath that is getting worse for the last several months.  Severity of dyspnea listed below.  She also has a cough from time to time.  The cough has been going on for years.  The cough is the same and ranges anywhere from mild to moderate.  She does cough at night some but not often.  She also brings up some phlegm but  not often.  The phlegm is clear to slightly yellow.  Never had hemoptysis does clear the throat.  Occasional wheezing present.  No nausea vomiting or diarrhea.   SYMPTOM SCALE - ILD 06/01/2019  09/03/2019    O2 use RA Night o2  Shortness of Breath 0 -> 5 scale with 5 being worst (score 6 If unable to do)   At rest 0.25 0  Simple tasks - showers, clothes change, eating, shaving 1 0  Household (dishes, doing bed, laundry) 1 0  Shopping 2 1.5  Walking level at own pace 1 0  Walking up Stairs 2.5 3  Total (40 - 48) Dyspnea Score 7.75 4.5  How bad is your cough? slight 1.5  How bad is your fatigue mld 1.5  nause  0  vomit  0  darrhea  0  anxiety  1  depressio  0      Past Medical History :  She got diagnosis COPD but she has ILD.  Likewise she got diagnosed with asthma but she has ILD.  There is in the past she has had diastolic heart failure and she has atrial fibrillation on Coumadin.  She has thyroid disease for the last few decades.  No collagen vascular disease no lupus no polymyositis no Sjogren's.  No diabetes n   ROS: Positive for arthralgia and fatigue.  No dysphagia no dry eyes no Raynaud's no nausea no vomiting.  Does have acid reflux occasionally.   FAMILY HISTORY of LUNG DISEASE: Denies.  Denies pulmonary fibrosis in the family.   EXPOSURE HISTORY: No smoke exposure.  No cigarettes no vaping no cocaine no marijuana no IV drug use no electronic cigarettes.   HOME and HOBBY DETAILS : She lives in a home that was built in the 56s.  She is lived in this home for [place] years in [place] single-family.  The basement is damp and she thinks there is mold in it but she never goes there.  There is no mold or mildew in the shower curtain no mold or mildew in the bathroom does not use a humidifier.  No CPAP use no nebulizer use no steam iron use no misting Fountain use.  No pet birds or parakeets no pet gerbils.  She does not use any feather pillows or duvet.  She is not  tested of any mold in the [facility] duct system so she does not know/does not play any wind instruments.  No guarding habits.   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  Denies completely   PULMONARY TOXICITY HISTORY (27 items): She took amiodarone for short.  At age 66 but otherwise negative.        Results for LAPORSHA, GREALISH (MRN [id number]) as of 11/20/2017 11:40  Ref. Range 12/22/2014 10:11 03/02/2015 16:52 09/26/2015 10:04  FVC-Pre Latest Units: L 1.38 1.88 1.55  FVC-%Pred-Pre Latest Units: % 47 65 54   Results for CAMBREA, KIRT (MRN [id number]) as of 11/20/2017 11:40  Ref. Range 12/22/2014 10:11 03/02/2015 16:52  DLCO unc Latest Units: ml/min/mmHg 17.07 16.90  DLCO unc % pred Latest Units: % 63 62     IMPRESSION: 1. The appearance of the lungs is compatible with interstitial lung disease, with a spectrum of findings considered most compatible with an alternative diagnosis to usual interstitial pneumonia (UIP) per current ATS guidelines. Specifically, imaging findings are most suggestive of progressive chronic hypersensitivity pneumonitis. 2. Aortic atherosclerosis, in addition to left main and right coronary artery disease. 3. Dilatation of the pulmonic trunk (3.6 cm in diameter), concerning for pulmonary arterial hypertension. 4. Cardiomegaly with biatrial dilatation (right greater than left).   Aortic Atherosclerosis (ICD10-I70.0).     Electronically Signed   By: Vinnie Langton M.D.   On: 07/17/2019 15:19  ROS - per HPI   OV 10/20/2020  Subjective:  Patient ID: Jolene Provost, female , DOB: 08-29-1933 , age 25 y.o. , MRN: [id number] , ADDRESS: [address] [place] [address] PCP [name], MD Patient Care Team: [name], MD as PCP - General (Family Medicine) Deboraha Sprang, MD as PCP - Cardiology (Cardiology) Deboraha Sprang, MD (Cardiology) Sable Feil, MD (Gastroenterology) Brand Males, MD  (Pulmonary Disease) Renato Shin, MD (Endocrinology) Erline Levine, MD (Neurosurgery) Weber Cooks, MD (Inactive) (Orthopedic Surgery) Rozetta Nunnery, MD (Otolaryngology)  This Provider for this visit: Treatment Team:  Attending Provider: Brand Males, MD  Follow-up interstitial lung disease of alternative to UIP pattern.  Stable for many years but between 2020 and 2021 progressive.  Concern for hypersensitive pneumonitis  Last CT chest January 2021 Last PFT March 2021  10/20/2020 -   Chief Complaint  Patient presents with   Follow-up    Reports concern that breathing has gotten more shallow over past year. Still wants to get scheduled for bronchoscopy. Concerned about clear phlegm.      HPI LATUNYA KISSICK 87 y.o. -last seen in the spring 2021.  At that time because of progressive ILD and concern for hypersensitive pneumonitis we discussed s doing a bronchoscopy with lavage to help narrow the differential diagnosis.  However this did not happen..  Initially patient was concerned but later by April 2021 she fractured her hip.  Since then she has not followed up.  Today she presents with her daughter and.  They both tell me that after the fracture she went to inpatient rehab.  And that she sustained a recurrent fracture and had to have another surgery in the month of May/June 2021.  Subsequently went home with PT.  After that in August 2021 also had bowel obstruction presumably due to opioids and had to have laparotomy.  Since then she has been doing home physical therapy and she is graduating from that.  Because of although she is lost a lot of weight and she is frail.  However she has never needed oxygen.  She not even using oxygen at night and is questioning whether she needs it 1 year ago she told me that actually helped her.  She continues with the Symbicort.  She does have crackles in the lung and intermittent squeaks which is baseline.  However she does not feel this.  She  is back now because she wants to reestablish with myself at the ILD center.  She is interested in bronchoscopy.  She is also interested in getting better and stronger.  ILD symptom score and simple walking desaturation test is deferred at this visit   OV 12/19/2020  Subjective:  Patient ID: Jolene Provost, female , DOB: 02/29/1934 , age 39 y.o. , MRN: [id number] , ADDRESS: [address] [place] [address] PCP [name], MD Patient Care Team: [name], MD as PCP - General (Family Medicine) Deboraha Sprang, MD as PCP - Cardiology (Cardiology) Deboraha Sprang, MD (Cardiology) Sable Feil, MD (Gastroenterology) Brand Males, MD (Pulmonary Disease) Renato Shin, MD (Endocrinology) Erline Levine, MD (Neurosurgery) Weber Cooks, MD (Inactive) (Orthopedic Surgery) Rozetta Nunnery, MD (Otolaryngology)  This Provider for this visit: Treatment Team:  Attending Provider: Brand Males, MD    12/19/2020 -   Chief Complaint  Patient presents with   Follow-up    PFT performed today. Pt states that her breathing feels worse since last visit.   Follow-up interstitial lung disease type/clinical hypersensitive pneumonitis progressive phenotype -ATS 2022 progressive pulmonary fibrosis [PPF]  HPI JYLLIAN HAYNIE 87 y.o. -returns for follow-up.  Her daughter and accompanies her.  Since her last visit she is undergoing physical therapy and rehab she is gotten stronger has gained weight but still using a walker.  When we walked her today she only walked 1 lap dropped 8 points.  She could not walk any further.  She had pulmonary function test and high-resolution CT chest.  Both showed progressive pulmonary fibrosis.  She meets definition for this by ATS 2022 criteria.  We discussed this and the possibility that the etiology  being hypersensitive pneumonitis.  We discussed that prednisone would be first-line treatment but given her frailty and hip fractures and age 26  we both took a shared decision making this not an ideal fit for her.  We next turned the conversation to nintedanib.  She is on Eliquis she has history of diverticulitis.  Explained high probably of diarrhea and the black box warnings of slight cardiac risk, bleeding risk and diverticulitis flareups.  Also fatigue.  She is not interested in this because of the side effects.  Explained the preventative nature.  At this point in time she prefers supportive care.  She wants to continue to get stronger and then reassess nintedanib.  We ordered a overnight oxygen study last time .  She and daughter report that it was done but I do not have the results.  They are willing to get it retested.  Meanwhile she is continue Symbicort because it helps her symptomatically.   SYMPTOM SCALE - ILD 06/01/2019  09/03/2019   10/20/2020 Lost lot of weight. Uses walker. Frail.  12/19/2020 ra  O2 use RA Night o2 O Not using nigh 02  Shortness of Breath 0 -> 5 scale with 5 being worst (score 6 If unable to do)     At rest 0.25 0  0  Simple tasks - showers, clothes change, eating, shaving 1 0  0  Household (dishes, doing bed, laundry) 1 0  0  Shopping 2 1.5  x  Walking level at own pace 1 0  1  Walking up Stairs 2.5 3  3   Total (40 - 48) Dyspnea Score 7.75 4.5  4  How bad is your cough? slight 1.5  1  How bad is your fatigue mld 1.5  0  nause  0  0  vomit  0  0  darrhea  0  0  anxiety  1  0  depressio  0  0      Simple office walk 185 feet x  3 laps goal with forehead probe 11/20/2017  06/01/2019  09/03/2019  12/19/2020   O2 used Room air RA ra ra  Number laps completed 3 laps 3 laps -was goal. Stopped at 2 laps due to severe dyspnea 3 laps goal but only walked1.5 laps  3 lap goal.  Did only 1 lap  Comments about pace Slow pace Slow pace avg pace Slow with walker  Resting Pulse Ox/HR 100% and 58/min 99% and 70.min 100% and 78/min 99% and 110  Final Pulse Ox/HR 97% and 88/min 93% and 110/min 92% and 104/min 91%  and 125  Desaturated </= 88% no no no   Desaturated <= 3% points yes Yes, 6 points Yes, 8 points Yyes 8 points  Got Tachycardic >/= 90/min no yes yes   Symptoms at end of test Mild dyspnea and back pain Moderate dyspnea and stopped prematurely Severe dyspnea, stopped prematureley Mild dyspnea  Miscellaneous comments Mild antalgic gait Back and calf pain too      PFT  PFT  PFT Results Latest Ref Rng & Units 12/19/2020 09/23/2019 09/26/2015 03/02/2015 12/22/2014  FVC-Pre L 1.08 1.30 1.55 1.88 1.38  FVC-Predicted Pre % 40 48 54 65 47  FVC-Post L - 1.31 - 1.82 1.53  FVC-Predicted Post % - 48 - 63 53  Pre FEV1/FVC % % 88 86 83 79 86  Post FEV1/FCV % % - 87 - 84 88  FEV1-Pre L 0.95 1.13 1.28 1.48 1.19  FEV1-Predicted Pre % 48 56 59  68 55  FEV1-Post L - 1.15 - 1.54 1.35  DLCO uncorrected ml/min/mmHg 7.79 9.24 - 16.90 17.07  DLCO UNC% % 39 46 - 62 63  DLCO corrected ml/min/mmHg 7.79 9.24 - - -  DLCO COR %Predicted % 39 46 - - -  DLVA Predicted % 99 101 - 103 111  TLC L - - - 3.84 4.04  TLC % Predicted % - - - 71 75  RV % Predicted % - - - 83 89      HRCT 11/24/20   IMPRESSION: 1. There is redemonstrated moderate pulmonary fibrosis with apical predominance, featuring a background of fine centrilobular and ground-glass nodularity, irregular, patchy geographic interstitial and ground-glass opacity, septal thickening, and overall mosaic attenuation of the airspaces throughout. Mild bronchiectasis and architectural distortion, primarily in the upper lobes. Lobular air trapping on expiratory phase imaging. These findings are substantially worsened compared to prior examination dated 07/17/2019 and further over time on examinations dating back to 12/26/2015. As on prior examination, these findings are in an "alternative diagnosis" pattern, and specifically suggestive of chronic, fibrosing hypersensitivity pneumonitis. Findings are suggestive of an alternative diagnosis (not UIP) per  consensus guidelines: Diagnosis of Idiopathic Pulmonary Fibrosis: An Official ATS/ERS/JRS/ALAT Clinical Practice Guideline. [place], Iss 5, [phone], Mar 02 2017. 2. Cardiomegaly. 3. Enlargement of the main pulmonary artery, as can be seen in pulmonary hypertension.   Aortic Atherosclerosis (ICD10-I70.0).     Electronically Signed   By: Eddie Candle M.D.   On: 11/25/2020 14:44     has a past medical history of Anemia, Anxiety, Atrial fibrillation ([place]), Bronchiectasis, CHF (congestive heart failure) ([place]), COPD (chronic obstructive pulmonary disease) (HCC), Cricopharyngeal achalasia, Depression, Diverticulosis, Dyslipidemia, Eczema, GERD with stricture, Glaucoma, H. pylori infection, Hypertension, Hyponatremia, Hypothyroid, IBS (irritable bowel syndrome), Lumbar disc disease, Neuropathy of both feet, Segmental colitis ([place]), Vertigo, and Wears glasses.   reports that she has never smoked. She has never used smokeless tobacco.  Past Surgical History:  Procedure Laterality Date   BOWEL RESECTION N/A 02/08/2020   Procedure: SMALL BOWEL RESECTION;  Surgeon: Donnie Mesa, MD;  Location: [facility];  Service: General;  Laterality: N/A;   BREAST SURGERY     br bx   CARDIAC CATHETERIZATION  07/01/2013   CATARACT EXTRACTION     both   COLONOSCOPY     ESOPHAGOSCOPY W/ BOTOX INJECTION  12/11/2011   Procedure: ESOPHAGOSCOPY WITH BOTOX INJECTION;  Surgeon: Rozetta Nunnery, MD;  Location: [facility];  Service: ENT;  Laterality: N/A;  esophogoscopy with dilation, botox injection   FOOT SURGERY  03/14/2011   gastroc slide-rt   GASTROSTOMY N/A 02/08/2020   Procedure: INSERTION OF GASTROSTOMY TUBE;  Surgeon: Donnie Mesa, MD;  Location: [facility];  Service: General;  Laterality: N/A;   HIP ARTHROPLASTY Right 10/28/2019   Procedure: ARTHROPLASTY BIPOLAR HIP (HEMIARTHROPLASTY);  Surgeon: Marybelle Killings, MD;  Location[facility];  Service: Orthopedics;  Laterality:  Right;   LAPAROTOMY N/A 02/08/2020   Procedure: EXPLORATORY LAPAROTOMY;  Surgeon: Donnie Mesa, MD;  Location: [facility];  Service: General;  Laterality: N/A;   LYSIS OF ADHESION N/A 02/08/2020   Procedure: LYSIS OF ADHESIONS;  Surgeon: Donnie Mesa, MD;  Location: [facility];  Service: General;  Laterality: N/A;   TOTAL ABDOMINAL HYSTERECTOMY     TOTAL HIP REVISION Right 11/16/2019   Procedure: TOTAL HIP REVISION BIPOLAR TO CEMENTED BIPOLAR;  Surgeon: Marybelle Killings, MD;  Location: [facility];  Service:  Orthopedics;  Laterality: Right;   WISDOM TOOTH EXTRACTION      Allergies  Allergen Reactions   Doxycycline Other (See Comments)    Rash on face and neck   Hydrocodone     Other reaction(s): hallucinations   Vicodin [Hydrocodone-Acetaminophen]     Hallucinations    Flagyl [Metronidazole] Nausea And Vomiting   Moxifloxacin Other (See Comments)     Weakness/fatigue   Pantoprazole Sodium Rash   Penicillins Rash    Has patient had a PCN reaction causing immediate rash, facial/tongue/throat swelling, SOB or lightheadedness with hypotension:unsure Has patient had a PCN reaction causing severe rash involving mucus membranes or skin necrosis:unsure Has patient had a PCN reaction that required hospitalization:No Has patient had a PCN reaction occurring within the last 10 years:Yes Cannot recall exact reaction due to time lapse If all of the above answers are "NO", then may proceed with Cephalosporin use.  Other reaction(s): Unknown    Immunization History  Administered Date(s) Administered   Influenza Split 04/06/2011, 04/03/2012   Influenza Whole 06/01/2009, 04/03/2010   Influenza, High Dose Seasonal PF 04/04/2017, 03/06/2018, 03/03/2019   Influenza,inj,Quad PF,6+ Mos 03/10/2013, 03/24/2015   Influenza-Unspecified 04/01/2014   Pneumococcal Conjugate-13 07/28/2014   Pneumococcal Polysaccharide-23 07/04/2006, 06/13/2016   Tdap 07/28/2014    Family History  Problem Relation Age of Onset    Hypertension Mother    Stroke Mother    Emphysema Brother    Other Father        miner's lung   Cancer Father        Lung   Colon polyps Sister    Pancreatic cancer Sister    Kidney disease Sister    Atrial fibrillation Other        siblings     Current Outpatient Medications:    acetaminophen (TYLENOL) 325 MG tablet, Take 2 tablets (650 mg total) by mouth every 6 (six) hours as needed for mild pain (or Fever >/= 101)., Disp: , Rfl:    apixaban (ELIQUIS) 2.5 MG TABS tablet, Take 1 tablet (2.5 mg total) by mouth 2 (two) times daily., Disp: 180 tablet, Rfl: 3   Biotin 5000 MCG TABS, Take 5,000 mcg by mouth daily., Disp: , Rfl:    budesonide-formoterol (SYMBICORT) 80-4.5 MCG/ACT inhaler, Inhale 2 puffs into the lungs 2 (two) times daily., Disp: 10.2 g, Rfl: 3   Cholecalciferol (VITAMIN D-3) 125 MCG (5000 UT) TABS, Take 5,000 Units by mouth daily., Disp: 30 tablet, Rfl: 0   clindamycin (CLEOCIN) 150 MG capsule, Take by mouth., Disp: , Rfl:    diclofenac Sodium (VOLTAREN) 1 % GEL, Apply 2 g topically 4 (four) times daily., Disp: 350 g, Rfl: 1   FLUoxetine (PROZAC) 20 MG capsule, Take 1 capsule by mouth daily., Disp: , Rfl:    furosemide (LASIX) 20 MG tablet, Take 20 mg by mouth daily., Disp: , Rfl:    hydrOXYzine (ATARAX/VISTARIL) 25 MG tablet, Take 12.5-25 mg by mouth every 8 (eight) hours as needed., Disp: , Rfl:    levothyroxine (SYNTHROID) 50 MCG tablet, Take 1 tablet (50 mcg total) by mouth daily., Disp: 90 tablet, Rfl: 3   polyethylene glycol (MIRALAX / GLYCOLAX) 17 g packet, Take 17 g by mouth daily., Disp: 14 each, Rfl: 0   potassium chloride (KLOR-CON) 8 MEQ tablet, Take 16 mEq by mouth daily., Disp: , Rfl:    pravastatin (PRAVACHOL) 40 MG tablet, Take 1 tablet (40 mg total) by mouth at bedtime., Disp: 30 tablet, Rfl: 0   vitamin  B-12 (CYANOCOBALAMIN) 1000 MCG tablet, Take 1,000 mcg by mouth 3 (three) times a week. MWF, Disp: , Rfl:    zinc gluconate 50 MG tablet, Take 50 mg by  mouth daily., Disp: , Rfl:       Objective:   Vitals:   12/19/20 1454  BP: 128/70  Pulse: (!) 110  SpO2: 99%  Weight: 157 lb (71.2 kg)  Height: 5\' 6"  (1.676 m)    Estimated body mass index is 25.34 kg/m as calculated from the following:   Height as of this encounter: 5\' 6"  (1.676 m).   Weight as of this encounter: 157 lb (71.2 kg).  @WEIGHTCHANGE @  [employer]   12/19/20 1454  Weight: 157 lb (71.2 kg)     Physical Exam Frail elderly female.  Has a walker.  Has crackles on exam.  No clubbing no cyanosis.  No stigmata of connective tissue disease alert and oriented x3.  She looks stronger than last time.       Assessment:       ICD-10-CM   1. ILD (interstitial lung disease) ([place])  J84.9     2. Hypersensitivity pneumonitis (HCC)  J67.9       The type of pulmonary fibrosis you have is likely what is called hypersensitive pneumonitis.  It is progressive over time particularly in the last year or 2.  The future course is 1 of progression.  Plan - We discussed in detail the approach to treatment - Based on side effect profile we will hold off on prednisone - Based on side effect profile we will hold off on nintedanib (esp being on Eliquis and having history of diverticulitis] - We will continue to do supportive care with the focus being you get physically stronger after the events of 2021 with fracture - We will retest overnight oxygen study on room air [do not know what happened to the most recent test]  -If abnormal taking oxygen at night could potentially help in the daytime  Follow-up - Return to see Dr. Chase Caller in 3 months or 4 months for a face-to-face visit -we will do simple walking desaturation test and symptom score at follow-up      Plan:      ( Level 05 visit: Estb 40-54 min     in  visit type: on-site physical face to visit  in total care time and counseling or/and coordination of care by this undersigned MD - Dr Brand Males. This includes  one or more of the following on this same day 12/19/2020: pre-charting, chart review, note writing, documentation discussion of test results, diagnostic or treatment recommendations, prognosis, risks and benefits of management options, instructions, education, compliance or risk-factor reduction. It excludes time spent by the [facility] or office staff in the care of the patient. Actual time 38 min)   SIGNATURE    Dr. Brand Males, M.D., F.C.C.P,  Pulmonary and Critical Care Medicine Staff Physician, [facility] Director - Interstitial Lung Disease  Program  Pulmonary [facility] [place], [place], [address]  Pager: [phone], If no answer or between  15:00h - 7:00h: call [phone] Telephone: [phone]  3:40 PM 12/19/2020

## 2020-12-19 NOTE — Addendum Note (Signed)
Addended by: Lorretta Harp on: 12/19/2020 03:42 PM   Modules accepted: Orders

## 2020-12-20 DIAGNOSIS — L97921 Non-pressure chronic ulcer of unspecified part of left lower leg limited to breakdown of skin: Secondary | ICD-10-CM | POA: Diagnosis not present

## 2020-12-20 DIAGNOSIS — I872 Venous insufficiency (chronic) (peripheral): Secondary | ICD-10-CM | POA: Diagnosis not present

## 2020-12-20 DIAGNOSIS — I5032 Chronic diastolic (congestive) heart failure: Secondary | ICD-10-CM | POA: Diagnosis not present

## 2020-12-20 DIAGNOSIS — I110 Hypertensive heart disease with heart failure: Secondary | ICD-10-CM | POA: Diagnosis not present

## 2020-12-20 DIAGNOSIS — I5020 Unspecified systolic (congestive) heart failure: Secondary | ICD-10-CM | POA: Diagnosis not present

## 2020-12-20 DIAGNOSIS — I482 Chronic atrial fibrillation, unspecified: Secondary | ICD-10-CM | POA: Diagnosis not present

## 2020-12-22 DIAGNOSIS — I872 Venous insufficiency (chronic) (peripheral): Secondary | ICD-10-CM | POA: Diagnosis not present

## 2020-12-22 DIAGNOSIS — I5020 Unspecified systolic (congestive) heart failure: Secondary | ICD-10-CM | POA: Diagnosis not present

## 2020-12-22 DIAGNOSIS — L97921 Non-pressure chronic ulcer of unspecified part of left lower leg limited to breakdown of skin: Secondary | ICD-10-CM | POA: Diagnosis not present

## 2020-12-22 DIAGNOSIS — I110 Hypertensive heart disease with heart failure: Secondary | ICD-10-CM | POA: Diagnosis not present

## 2020-12-22 DIAGNOSIS — I482 Chronic atrial fibrillation, unspecified: Secondary | ICD-10-CM | POA: Diagnosis not present

## 2020-12-22 DIAGNOSIS — I5032 Chronic diastolic (congestive) heart failure: Secondary | ICD-10-CM | POA: Diagnosis not present

## 2020-12-26 NOTE — Telephone Encounter (Signed)
Attempted to call patient, left message for patient to call back to office.   

## 2020-12-27 DIAGNOSIS — I110 Hypertensive heart disease with heart failure: Secondary | ICD-10-CM | POA: Diagnosis not present

## 2020-12-27 DIAGNOSIS — L97921 Non-pressure chronic ulcer of unspecified part of left lower leg limited to breakdown of skin: Secondary | ICD-10-CM | POA: Diagnosis not present

## 2020-12-27 DIAGNOSIS — I5032 Chronic diastolic (congestive) heart failure: Secondary | ICD-10-CM | POA: Diagnosis not present

## 2020-12-27 DIAGNOSIS — I5020 Unspecified systolic (congestive) heart failure: Secondary | ICD-10-CM | POA: Diagnosis not present

## 2020-12-27 DIAGNOSIS — I482 Chronic atrial fibrillation, unspecified: Secondary | ICD-10-CM | POA: Diagnosis not present

## 2020-12-27 DIAGNOSIS — I872 Venous insufficiency (chronic) (peripheral): Secondary | ICD-10-CM | POA: Diagnosis not present

## 2020-12-29 NOTE — Telephone Encounter (Signed)
Attempted to call patient, left message for patient to call back to office.   

## 2021-01-10 DIAGNOSIS — Z9181 History of falling: Secondary | ICD-10-CM | POA: Diagnosis not present

## 2021-01-10 DIAGNOSIS — R2689 Other abnormalities of gait and mobility: Secondary | ICD-10-CM | POA: Diagnosis not present

## 2021-01-11 ENCOUNTER — Encounter: Payer: Self-pay | Admitting: *Deleted

## 2021-01-11 NOTE — Telephone Encounter (Signed)
Attempted to contact patient daughter x3 (Okay per DPR). Left detailed message (okay per DPR) with Dr. Delphina Cahill recommendations and to call back with any issues or questions.

## 2021-01-17 DIAGNOSIS — Z9181 History of falling: Secondary | ICD-10-CM | POA: Diagnosis not present

## 2021-01-17 DIAGNOSIS — R2689 Other abnormalities of gait and mobility: Secondary | ICD-10-CM | POA: Diagnosis not present

## 2021-01-18 DIAGNOSIS — Z9181 History of falling: Secondary | ICD-10-CM | POA: Diagnosis not present

## 2021-01-18 DIAGNOSIS — R2689 Other abnormalities of gait and mobility: Secondary | ICD-10-CM | POA: Diagnosis not present

## 2021-01-19 ENCOUNTER — Telehealth: Payer: Self-pay | Admitting: Internal Medicine

## 2021-01-19 NOTE — Telephone Encounter (Signed)
Patient wanted to switch back to Dr. Caryl Comes from Dr. Rayann Heman. She said she just feels more comfortable with Dr. Caryl Comes.  Please let her know what you both decide

## 2021-01-20 NOTE — Telephone Encounter (Signed)
I am fine with whatever

## 2021-01-21 NOTE — Telephone Encounter (Signed)
Ok with me 

## 2021-01-26 DIAGNOSIS — Z9181 History of falling: Secondary | ICD-10-CM | POA: Diagnosis not present

## 2021-01-26 DIAGNOSIS — R2689 Other abnormalities of gait and mobility: Secondary | ICD-10-CM | POA: Diagnosis not present

## 2021-01-27 DIAGNOSIS — Z9181 History of falling: Secondary | ICD-10-CM | POA: Diagnosis not present

## 2021-01-27 DIAGNOSIS — R2689 Other abnormalities of gait and mobility: Secondary | ICD-10-CM | POA: Diagnosis not present

## 2021-01-30 DIAGNOSIS — R2689 Other abnormalities of gait and mobility: Secondary | ICD-10-CM | POA: Diagnosis not present

## 2021-01-30 DIAGNOSIS — Z9181 History of falling: Secondary | ICD-10-CM | POA: Diagnosis not present

## 2021-02-01 DIAGNOSIS — Z9181 History of falling: Secondary | ICD-10-CM | POA: Diagnosis not present

## 2021-02-01 DIAGNOSIS — R2689 Other abnormalities of gait and mobility: Secondary | ICD-10-CM | POA: Diagnosis not present

## 2021-02-02 DIAGNOSIS — B351 Tinea unguium: Secondary | ICD-10-CM | POA: Diagnosis not present

## 2021-02-02 DIAGNOSIS — I739 Peripheral vascular disease, unspecified: Secondary | ICD-10-CM | POA: Diagnosis not present

## 2021-02-07 DIAGNOSIS — R2689 Other abnormalities of gait and mobility: Secondary | ICD-10-CM | POA: Diagnosis not present

## 2021-02-07 DIAGNOSIS — Z9181 History of falling: Secondary | ICD-10-CM | POA: Diagnosis not present

## 2021-02-10 ENCOUNTER — Ambulatory Visit (INDEPENDENT_AMBULATORY_CARE_PROVIDER_SITE_OTHER): Payer: Medicare Other | Admitting: Internal Medicine

## 2021-02-10 ENCOUNTER — Other Ambulatory Visit: Payer: Self-pay

## 2021-02-10 ENCOUNTER — Encounter: Payer: Self-pay | Admitting: Internal Medicine

## 2021-02-10 VITALS — BP 137/83 | HR 105 | Ht 66.0 in | Wt 154.2 lb

## 2021-02-10 DIAGNOSIS — I509 Heart failure, unspecified: Secondary | ICD-10-CM

## 2021-02-10 DIAGNOSIS — I4821 Permanent atrial fibrillation: Secondary | ICD-10-CM

## 2021-02-10 NOTE — Progress Notes (Signed)
Patient Care Team: [name], MD as PCP - General (Family Medicine) Deboraha Sprang, MD as PCP - Cardiology (Cardiology) Deboraha Sprang, MD (Cardiology) Sable Feil, MD (Gastroenterology) Brand Males, MD (Pulmonary Disease) Renato Shin, MD (Endocrinology) Erline Levine, MD (Neurosurgery) Weber Cooks, MD (Inactive) (Orthopedic Surgery) Rozetta Nunnery, MD (Otolaryngology)   HPI  Stephanie Cortez is a 87 y.o. female Seen in followup for permanent atrial fibrillation having failed multiple cardioversions and antiarrhythmic drugs.  She is maintained on coumadin; she was seen by Dr. Rayann Heman 4/22 with a change to Eliquis.  She has a venous insufficiency followed by vascular surgery  She was admitted 12/14  [place]  because of chest pain elevated blood pressure and   atrial fibrillation with a rapid rate. She underwent a catheterization which was nonobstructive  Echocardiogram was also normal except for left atrial enlargement.    Sh has been diagnosed with ILD and is followed by Dr Chase Caller   A lot of things have occurred with a fractured hip, subsequent discontinuation of rate control.  She has been living with her daughter following at discharge.  Now doing quite well.  Minimal dyspnea.  Uses a walker.  Sometimes with fatigue.  No chest pain.  Mild edema which responds to diuretics as well as compression.   [employer] spaniel named Elyse Hsu; sister died fall 2017-09-29  Date Cr K Hgb  1/19 0.76 4.8 12.7   8/20 0.75 4.7 [id number]   Thromboembolic risk factors ( age -30, HTN-1, , Gender-1) for a CHADSVASc Score of >=4      Past Medical History:  Diagnosis Date   Anemia    - Hgb 9.7gm% on 07/13/2008 in [place] -  Hgg 129gm% wiht normal irone levsl and ferritin 10/27/2008 in [place] Recurrent otitis/sinusitis   Anxiety    chronic BZ prn   Atrial fibrillation (HCC)    chronic anticoag   Bronchiectasis    >PFT 07/13/2008 in [place] 1.9L/76%, FVC 2.45L/74, Ratio  79, TLC 121%, DLCO 64%  AE BRonchiectasis - Dec 2010.New Rx:  outpatient - Feb 2011 - Rx outpatient   CHF (congestive heart failure) ([place])    COPD (chronic obstructive pulmonary disease) (HCC)    bronchiectasis   Cricopharyngeal achalasia    Depression    Diverticulosis    Dyslipidemia    Eczema    GERD with stricture    Glaucoma    H. 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[place])    Vertigo    Wears glasses     Past Surgical History:  Procedure Laterality Date   BOWEL RESECTION N/A 02/08/2020   Procedure: SMALL BOWEL RESECTION;  Surgeon: Donnie Mesa, MD;  Location: [facility];  Service: General;  Laterality: N/A;   BREAST SURGERY     br bx   CARDIAC CATHETERIZATION  07/01/2013   CATARACT EXTRACTION     both   COLONOSCOPY     ESOPHAGOSCOPY W/ BOTOX INJECTION  12/11/2011   Procedure: ESOPHAGOSCOPY WITH BOTOX INJECTION;  Surgeon: Rozetta Nunnery, MD;  Location: [facility];  Service: ENT;  Laterality: N/A;  esophogoscopy with dilation, botox injection   FOOT SURGERY  03/14/2011   gastroc slide-rt   GASTROSTOMY N/A 02/08/2020  Procedure: INSERTION OF GASTROSTOMY TUBE;  Surgeon: Donnie Mesa, MD;  Location: [facility];  Service: General;  Laterality: N/A;   HIP ARTHROPLASTY Right 10/28/2019   Procedure: ARTHROPLASTY BIPOLAR HIP (HEMIARTHROPLASTY);  Surgeon: Marybelle Killings, MD;  [facility];  Service: Orthopedics;  Laterality: Right;   LAPAROTOMY N/A 02/08/2020   Procedure: EXPLORATORY LAPAROTOMY;  Surgeon: Donnie Mesa, MD;  Location: [facility];  Service: General;  Laterality: N/A;   LYSIS OF ADHESION N/A 02/08/2020   Procedure: LYSIS OF ADHESIONS;  Surgeon: Donnie Mesa, MD;  Location: [facility];  Service: General;  Laterality: N/A;   TOTAL ABDOMINAL HYSTERECTOMY     TOTAL HIP REVISION Right 11/16/2019    Procedure: TOTAL HIP REVISION BIPOLAR TO CEMENTED BIPOLAR;  Surgeon: Marybelle Killings, MD;  Location: [facility];  Service: Orthopedics;  Laterality: Right;   WISDOM TOOTH EXTRACTION      Current Outpatient Medications  Medication Sig Dispense Refill   acetaminophen (TYLENOL) 325 MG tablet Take 2 tablets (650 mg total) by mouth every 6 (six) hours as needed for mild pain (or Fever >/= 101).     apixaban (ELIQUIS) 2.5 MG TABS tablet Take 1 tablet (2.5 mg total) by mouth 2 (two) times daily. 180 tablet 3   Biotin 5000 MCG TABS Take 5,000 mcg by mouth daily.     budesonide-formoterol (SYMBICORT) 80-4.5 MCG/ACT inhaler Inhale 2 puffs into the lungs 2 (two) times daily. 10.2 g 3   Cholecalciferol (VITAMIN D-3) 125 MCG (5000 UT) TABS Take 5,000 Units by mouth daily. 30 tablet 0   diclofenac Sodium (VOLTAREN) 1 % GEL Apply 2 g topically 4 (four) times daily. 350 g 1   FLUoxetine (PROZAC) 20 MG capsule Take 1 capsule by mouth daily.     furosemide (LASIX) 20 MG tablet Take 20 mg by mouth daily.     hydrOXYzine (ATARAX/VISTARIL) 25 MG tablet Take 12.5-25 mg by mouth every 8 (eight) hours as needed.     levothyroxine (SYNTHROID) 50 MCG tablet Take 1 tablet (50 mcg total) by mouth daily. 90 tablet 3   polyethylene glycol (MIRALAX / GLYCOLAX) 17 g packet Take 17 g by mouth daily. 14 each 0   potassium chloride (KLOR-CON) 8 MEQ tablet Take 16 mEq by mouth daily.     pravastatin (PRAVACHOL) 40 MG tablet Take 1 tablet (40 mg total) by mouth at bedtime. 30 tablet 0   vitamin B-12 (CYANOCOBALAMIN) 1000 MCG tablet Take 1,000 mcg by mouth 3 (three) times a week. MWF     zinc gluconate 50 MG tablet Take 50 mg by mouth daily.     clindamycin (CLEOCIN) 150 MG capsule Take by mouth. (Patient not taking: Reported on 02/10/2021)     No current facility-administered medications for this visit.    Allergies  Allergen Reactions   Doxycycline Other (See Comments)    Rash on face and neck   Hydrocodone     Other  reaction(s): hallucinations   Vicodin [Hydrocodone-Acetaminophen]     Hallucinations    Flagyl [Metronidazole] Nausea And Vomiting   Moxifloxacin Other (See Comments)     Weakness/fatigue   Pantoprazole Sodium Rash   Penicillins Rash    Has patient had a PCN reaction causing immediate rash, facial/tongue/throat swelling, SOB or lightheadedness with hypotension:unsure Has patient had a PCN reaction causing severe rash involving mucus membranes or skin necrosis:unsure Has patient had a PCN reaction that required hospitalization:No Has patient had a PCN reaction occurring within the last 10 years:Yes Cannot recall exact  reaction due to time lapse If all of the above answers are "NO", then may proceed with Cephalosporin use.  Other reaction(s): Unknown    Review of Systems negative except from HPI and PMH  Physical Exam BP 137/83   Pulse (!) 105   Ht '5\' 6"'$  (1.676 m)   Wt 154 lb 3.2 oz (69.9 kg)   LMP  (LMP Unknown)   SpO2 94%   BMI 24.89 kg/m  Well developed and well nourished in no acute distress HENT normal Neck supple with [id number]-[id number] Clear Device pocket well healed; without hematoma or erythema.  There is no tethering  Irregularly irregular rate and rhythm,  2/6 murmur Abd-soft with active BS No Clubbing cyanosis tr edema Skin-warm and dry A & Oriented  Grossly normal sensory and motor function  ECG atrial fibrillation at 105-/13/38 Right bundle branch block   Assessment and plan  Atrial fibrillation-permanent  HFpEF  Hypertension  Interstitial lung disease  Dyspnea on exertion  Mildly volume overloaded but is really bothered by the frequency of urination associated with her diuretic.  Discussed 2 strategies, 1 with restricting her fluid intake and the other was decreasing her diuretics to every other day so that she can manage its misery.  We will undertake both simultaneously, decreasing her furosemide to 20 every other day and trying to have her decrease her p.o.  intake by about 25%.  We also discussed the role of calf compression  Blood pressures are reasonable.  Her heart rates are little bit on the fast side; however, she feels so much better off rate controlling drugs associated with relatively lower blood pressure that she would like to avoid.  We did discuss the Race 2 data wherein target rates of 110 were randomly compared to 80 and the group w the faster rate control fared better albeit with a very complicated composite endpoint.  Hence, we will leave her with average rates in the 90s and 100 and not resume rate control.

## 2021-02-10 NOTE — Patient Instructions (Signed)
Medication Instructions:  Your physician recommends that you continue on your current medications as directed. Please refer to the Current Medication list given to you today.  Labwork: None ordered.  Testing/Procedures: None ordered.  Follow-Up: Your physician recommends that you schedule a follow-up appointment in:   12 months with Dr. Caryl Comes  Any Other Special Instructions Will Be Listed Below (If Applicable).     If you need a refill on your cardiac medications before your next appointment, please call your pharmacy.

## 2021-02-14 ENCOUNTER — Encounter: Payer: Self-pay | Admitting: Internal Medicine

## 2021-02-14 ENCOUNTER — Ambulatory Visit (INDEPENDENT_AMBULATORY_CARE_PROVIDER_SITE_OTHER): Payer: Medicare Other | Admitting: Internal Medicine

## 2021-02-14 VITALS — BP 130/70 | HR 72 | Ht 66.0 in | Wt 153.5 lb

## 2021-02-14 DIAGNOSIS — R198 Other specified symptoms and signs involving the digestive system and abdomen: Secondary | ICD-10-CM

## 2021-02-14 DIAGNOSIS — R1030 Lower abdominal pain, unspecified: Secondary | ICD-10-CM | POA: Diagnosis not present

## 2021-02-14 DIAGNOSIS — Z9181 History of falling: Secondary | ICD-10-CM | POA: Diagnosis not present

## 2021-02-14 DIAGNOSIS — K219 Gastro-esophageal reflux disease without esophagitis: Secondary | ICD-10-CM

## 2021-02-14 DIAGNOSIS — R2689 Other abnormalities of gait and mobility: Secondary | ICD-10-CM | POA: Diagnosis not present

## 2021-02-14 DIAGNOSIS — K5909 Other constipation: Secondary | ICD-10-CM

## 2021-02-14 DIAGNOSIS — R0989 Other specified symptoms and signs involving the circulatory and respiratory systems: Secondary | ICD-10-CM

## 2021-02-14 MED ORDER — OMEPRAZOLE 40 MG PO CPDR: 40.0000 mg | DELAYED_RELEASE_CAPSULE | Freq: Every day | ORAL | 11 refills | Status: DC

## 2021-02-14 NOTE — Patient Instructions (Signed)
We have sent the following medications to your pharmacy for you to pick up at your convenience: omeprazole.   Please start over the counter Metamucil 2 teaspoons daily increasing to twice daily.   Thank you for entrusting me with your care and for choosing [facility], Dr. Zenovia Jarred

## 2021-02-14 NOTE — Progress Notes (Signed)
Patient ID: ORIELLE COCKEY, female   DOB: [birth year], 87 y.o.   MRN: [id number] HPI: Rayel Schirtzinger is an 87 year old female known to me though not seen since January 2018 with a history of GERD, cricopharyngeal achalasia (previously treated with Botox), history of segmental colitis associated with diverticulosis treated for some time with mesalamine, interstitial lung disease/hypersensitivity pneumonitis, atrial fibrillation on Eliquis (previously warfarin), hypertension, lumbar disc disease, and small bowel resection due to ischemia in 2021 who is seen for follow-up and to reestablish care.  She is here today with her daughter.  In August 2021 she presented to the hospital with fairly acute abdominal pain.  She had peritoneal signs and CT angiography scan of the abdomen pelvis showed concerning small bowel ischemia felt secondary to adhesive disease.  She was taken to the operating room by Dr. Georgette Dover for an exploratory laparotomy, lysis of adhesions, small bowel resection and gastrostomy tube placement which has been subsequently removed.  This recovery was slow.  She reports that she is having several different issues today including phlegm in her throat which she has the cough and clear, globus sensation, belching and heartburn.  She has some emesis and regurgitation and occasionally true vomiting.  She has to eat slowly and take smaller bites.  She is using Tums for heartburn.  Her daughter after her surgery last year weaned her off of omeprazole completely as well as several other heart medications.  Her daughter feels that she is done better off of these medications with less dizziness.  Separate issue is she feels constipated and has hard small stools which she has to strain to produce.  Evacuation is often incomplete.  Normally daily bowel movements but often small and hard.  She has lower abdominal pain bilaterally which can improve after bowel movement.  MiraLAX was not helpful and she reports this  softens the stool but does not help her evacuate it.  She has not seen blood in her stool or melena.  Past Medical History:  Diagnosis Date   Anemia    - Hgb 9.7gm% on 07/13/2008 in [place] -  Hgg 129gm% wiht normal irone levsl and ferritin 10/27/2008 in [place] Recurrent otitis/sinusitis   Anxiety    chronic BZ prn   Atrial fibrillation (HCC)    chronic anticoag   Bronchiectasis    >PFT 07/13/2008 in [place] 1.9L/76%, FVC 2.45L/74, Ratio 79, TLC 121%, DLCO 64%  AE BRonchiectasis - Dec 2010.New Rx:  outpatient - Feb 2011 - Rx outpatient   CHF (congestive heart failure) ([place])    COPD (chronic obstructive pulmonary disease) (HCC)    bronchiectasis   Cricopharyngeal achalasia    Depression    Diverticulosis    Dyslipidemia    Eczema    GERD with stricture    Glaucoma    H. 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[place])    Vertigo    Wears glasses     Past Surgical History:  Procedure Laterality Date   BOWEL RESECTION N/A 02/08/2020   Procedure: SMALL BOWEL RESECTION;  Surgeon: Donnie Mesa, MD;  Location: [facility];  Service: General;  Laterality: N/A;   BREAST SURGERY     br bx   CARDIAC CATHETERIZATION  07/01/2013   CATARACT EXTRACTION  both   COLONOSCOPY     ESOPHAGOSCOPY W/ BOTOX INJECTION  12/11/2011   Procedure: ESOPHAGOSCOPY WITH BOTOX INJECTION;  Surgeon: Rozetta Nunnery, MD;  Location: [facility];  Service: ENT;  Laterality: N/A;  esophogoscopy with dilation, botox injection   FOOT SURGERY  03/14/2011   gastroc slide-rt   GASTROSTOMY N/A 02/08/2020   Procedure: INSERTION OF GASTROSTOMY TUBE;  Surgeon: Donnie Mesa, MD;  Location: [facility];  Service: General;  Laterality: N/A;   HIP ARTHROPLASTY Right 10/28/2019   Procedure: ARTHROPLASTY BIPOLAR HIP (HEMIARTHROPLASTY);  Surgeon: Marybelle Killings, MD;  Location: [facility];  Service: Orthopedics;  Laterality: Right;   LAPAROTOMY N/A 02/08/2020   Procedure: EXPLORATORY LAPAROTOMY;  Surgeon: Donnie Mesa, MD;  Location: [facility];  Service: General;  Laterality: N/A;   LYSIS OF ADHESION N/A 02/08/2020   Procedure: LYSIS OF ADHESIONS;  Surgeon: Donnie Mesa, MD;  Location: [facility];  Service: General;  Laterality: N/A;   TOTAL ABDOMINAL HYSTERECTOMY     TOTAL HIP REVISION Right 11/16/2019   Procedure: TOTAL HIP REVISION BIPOLAR TO CEMENTED BIPOLAR;  Surgeon: Marybelle Killings, MD;  Location: [facility];  Service: Orthopedics;  Laterality: Right;   WISDOM TOOTH EXTRACTION      Outpatient Medications Prior to Visit  Medication Sig Dispense Refill   acetaminophen (TYLENOL) 325 MG tablet Take 2 tablets (650 mg total) by mouth every 6 (six) hours as needed for mild pain (or Fever >/= 101).     apixaban (ELIQUIS) 2.5 MG TABS tablet Take 1 tablet (2.5 mg total) by mouth 2 (two) times daily. 180 tablet 3   Biotin 5000 MCG TABS Take 5,000 mcg by mouth daily.     budesonide-formoterol (SYMBICORT) 80-4.5 MCG/ACT inhaler Inhale 2 puffs into the lungs 2 (two) times daily. 10.2 g 3   Cholecalciferol (VITAMIN D-3) 125 MCG (5000 UT) TABS Take 5,000 Units by mouth daily. 30 tablet 0   diclofenac Sodium (VOLTAREN) 1 % GEL Apply 2 g topically 4 (four) times daily. (Patient taking differently: Apply 2 g topically 4 (four) times daily. On hand) 350 g 1   FLUoxetine (PROZAC) 20 MG capsule Take 1 capsule by mouth daily.     furosemide (LASIX) 20 MG tablet Take 20 mg by mouth daily.     hydrOXYzine (ATARAX/VISTARIL) 25 MG tablet Take 12.5-25 mg by mouth every 8 (eight) hours as needed.     levothyroxine (SYNTHROID) 50 MCG tablet Take 1 tablet (50 mcg total) by mouth daily. 90 tablet 3   polyethylene glycol (MIRALAX / GLYCOLAX) 17 g packet Take 17 g by mouth daily. 14 each 0   potassium chloride (KLOR-CON) 8 MEQ tablet Take 16 mEq by mouth daily.     pravastatin (PRAVACHOL) 40 MG  tablet Take 1 tablet (40 mg total) by mouth at bedtime. 30 tablet 0   vitamin B-12 (CYANOCOBALAMIN) 1000 MCG tablet Take 1,000 mcg by mouth 3 (three) times a week. MWF     zinc gluconate 50 MG tablet Take 50 mg by mouth daily.     clindamycin (CLEOCIN) 150 MG capsule Take by mouth. (Patient not taking: Reported on 02/10/2021)     No facility-administered medications prior to visit.    Allergies  Allergen Reactions   Doxycycline Other (See Comments)    Rash on face and neck   Hydrocodone     Other reaction(s): hallucinations   Vicodin [Hydrocodone-Acetaminophen]     Hallucinations    Flagyl [Metronidazole] Nausea And Vomiting  Moxifloxacin Other (See Comments)     Weakness/fatigue   Pantoprazole Sodium Rash   Penicillins Rash    Has patient had a PCN reaction causing immediate rash, facial/tongue/throat swelling, SOB or lightheadedness with hypotension:unsure Has patient had a PCN reaction causing severe rash involving mucus membranes or skin necrosis:unsure Has patient had a PCN reaction that required hospitalization:No Has patient had a PCN reaction occurring within the last 10 years:Yes Cannot recall exact reaction due to time lapse If all of the above answers are "NO", then may proceed with Cephalosporin use.  Other reaction(s): Unknown    Family History  Problem Relation Age of Onset   Hypertension Mother    Stroke Mother    Emphysema Brother    Other Father        miner's lung   Cancer Father        Lung   Colon polyps Sister    Pancreatic cancer Sister    Kidney disease Sister    Atrial fibrillation Other        siblings    Social History   Tobacco Use   Smoking status: Never   Smokeless tobacco: Never  Vaping Use   Vaping Use: Never used  Substance Use Topics   Alcohol use: No   Drug use: No    ROS: As per history of present illness, otherwise negative  BP 130/70   Pulse 72   Ht '5\' 6"'$  (1.676 m)   Wt 153 lb 8 oz (69.6 kg)   LMP  (LMP Unknown)    BMI 24.78 kg/m  Constitutional: Well-developed and well-nourished. No distress. HEENT: Normocephalic and atraumatic.  No scleral icterus. Cardiovascular: Normal rate, regular rhythm and intact distal pulses. No M/R/G Pulmonary/chest: Effort normal and breath sounds normal. No wheezing, rales or rhonchi. Abdominal: Soft, nontender, nondistended. Bowel sounds active throughout. There are no masses palpable. No hepatosplenomegaly. Extremities: no clubbing, cyanosis, or edema Neurological: Alert and oriented to person place and time. Skin: Skin is warm and dry.  Psychiatric: Normal mood and affect. Behavior is normal.  RELEVANT LABS AND IMAGING: CBC    Component Value Date/Time   WBC 6.1 02/29/2020 0449   RBC 3.33 (L) 02/29/2020 0449   HGB 9.6 (L) 02/29/2020 0449   HGB 11.3 02/16/2019 1452   HCT 30.6 (L) 02/29/2020 0449   HCT 33.9 (L) 02/16/2019 1452   PLT 407 (H) 02/29/2020 0449   PLT 256 02/16/2019 1452   MCV 91.9 02/29/2020 0449   MCV 92 02/16/2019 1452   MCH 28.8 02/29/2020 0449   MCHC 31.4 02/29/2020 0449   RDW 15.0 02/29/2020 0449   RDW 12.1 02/16/2019 1452   LYMPHSABS 1.5 02/18/2020 0913   MONOABS 0.8 02/18/2020 0913   EOSABS 0.4 02/18/2020 0913   BASOSABS 0.1 02/18/2020 0913    CMP     Component Value Date/Time   NA 134 (L) 02/29/2020 0449   NA 130 (L) 02/16/2019 1452   K 3.8 02/29/2020 0449   CL 103 02/29/2020 0449   CO2 23 02/29/2020 0449   GLUCOSE 99 02/29/2020 0449   BUN 10 02/29/2020 0449   BUN 13 02/16/2019 1452   CREATININE 0.53 02/29/2020 0449   CALCIUM 9.3 02/29/2020 0449   PROT 5.3 (L) 02/18/2020 0913   ALBUMIN 2.3 (L) 02/18/2020 0913   AST 15 02/18/2020 0913   ALT 10 02/18/2020 0913   ALKPHOS 119 02/18/2020 0913   BILITOT 0.6 02/18/2020 0913   GFRNONAA >60 02/29/2020 0449   GFRAA >60 02/29/2020  [id number]   Clinical Data: Difficulty swallowing.  Unable to pass endoscope  past the pharynx   ESOPHOGRAM/BARIUM SWALLOW -- May 2013  Technique:   Combined double contrast and single contrast  examination performed using effervescent crystals, thick barium  liquid, and thin barium liquid.   Fluoroscopy time:  2.8 minutes.   Comparison:  None.   Findings:  Frontal and lateral views of the hypopharynx while  swallowing show a marked cricopharyngeus impression on the  posterior esophagus.  Just distal to the cricopharyngeus is a tiny  anterior esophageal web.   Double contrast imaging of the esophagus is otherwise normal  without evidence for esophageal mass lesion, diverticulum,  mid/distal stricture, or mucosal ulceration.  No hiatal hernia.   Primary peristalsis is absent on all swallows.   The patient was given a 13 mm barium tablet to take with water.  This tablet becomes lodged at the level of the cricopharyngeus and  she is able to pass it further only with concerted effort and  multiple swallows of water.  She stated that with the tablet lodged  at this position it recreated the symptoms she has been having  hilum.   IMPRESSION:  Very prominent cricopharyngeus impression blocks the passage of a  13 mm barium tablet although it eventually does pass with effort  and repeated swallows. When the tablet was lodged proximal to the  cricopharyngeus, the patient states that this recreated her  symptoms.  Although she does have a very tiny anterior web in the  proximal esophagus, the cricopharyngeus appears to be the source of  the obstruction to tablet passage.     CTA ABDOMEN AND PELVIS WITHOUT AND WITH CONTRAST - Aug 2021   TECHNIQUE: Multidetector CT imaging of the abdomen and pelvis was performed using the standard protocol during bolus administration of intravenous contrast. Multiplanar reconstructed images and MIPs were obtained and reviewed to evaluate the vascular anatomy.   CONTRAST:  14m OMNIPAQUE IOHEXOL 350 MG/ML SOLN   COMPARISON:  10/25/2011   FINDINGS: VASCULAR   Aorta: Mild atherosclerotic  changes of the thoracic aorta and mild to moderate atherosclerosis of the abdominal aorta. No dissection. No periaortic fluid.   Celiac: Celiac artery patent at the origin without high-grade stenosis. Mild atherosclerosis. Branches are patent and of relatively normal caliber.   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   SMA: The SMA is patent at the origin with minimal atherosclerosis. SMA tapers to very small caliber within the mesentery. There is no occlusion identified on the CT angiogram.   Renals:   - Right: Right renal artery patent with minimal atherosclerosis. Coronal images demonstrates some beading pattern of the right renal artery.   - Left: Left renal artery patent with minimal atherosclerosis. Coronal images demonstrate questionable subtle beading pattern of the left renal artery.   IMA: IMA is patent   Right lower extremity:   Unremarkable course, caliber, and contour of the right iliac system. Mild atherosclerosis. No aneurysm, dissection, or occlusion. Hypogastric artery is patent. Common femoral artery patent. Proximal SFA and profunda femoris patent.   Left lower extremity:   Unremarkable course, caliber, and contour of the left iliac system. Mild atherosclerosis. No aneurysm, dissection, or occlusion. Hypogastric artery is patent. Common femoral artery patent. Proximal SFA and profunda femoris patent.   Veins:   Portal system:   Portal venous system patent. No esophageal varices. No evidence of gastric varices. Splenic vein  patent of small caliber.   The SMV is patent at the confluence with the portal vein/splenic vein. Distal to the confluence, SMV and the SMV branches are completely decompressed and essentially non visible.   Review of the MIP images confirms the above findings.   NON-VASCULAR   Lower chest: Emphysematous changes at the lung bases.   Hepatobiliary:  Unremarkable appearance of the liver. Unremarkable gall bladder.   Pancreas: Unremarkable.   Spleen: I few punctate calcifications within the splenic parenchyma possibly secondary to prior granulomatous disease.   Adrenals/Urinary Tract:   - Right adrenal gland: Unremarkable   - Left adrenal gland: Unremarkable.   - Right kidney: No hydronephrosis, nephrolithiasis, inflammation, or ureteral dilation. Small hypoechoic/nonenhancing lesions which are too small to characterize.   - Left Kidney: No hydronephrosis, nephrolithiasis, inflammation, or ureteral dilation. Small hypoenhancing hypodense cystic lesions which are too small to characterize.   - Urinary Bladder: Unremarkable.   Stomach/Bowel:   - Stomach: Hiatal hernia. Stomach is decompressed. No fluid fluid level. No accumulation of contrast to suggest extravasation. No focal wall thickening. Mucosa of the stomach enhances within normal limits.   - Small bowel: Proximal small bowel decompressed with typical enhancement pattern expected within the small bowel on the venous phase.   There is a long segment of small bowel within the mid and lower abdomen demonstrating borderline dilation, extensive edema within the mesentery, and non enhancement on the arterial and venous phases. Single loop within the mid abdomen anterior to the sacral base on image 131 of series 17 demonstrate is differential enhancement, with enhancement proximally and non enhancement within the adjacent segment.   Presumed transition point within the bowel on image 125 of series 17, right abdomen.   There is no evidence of accumulation of high density fluid within small bowel that would suggest active extravasation.   - Appendix: Appendix not visualized.   - Colon: Terminal ileum at the IC valve is decompressed. Proximal colon appears to enhance within normal limits without focal inflammatory changes. Some retained high density material within  the transverse colon. Diverticular disease without focal inflammatory changes or distention of the left colon and sigmoid colon.   Lymphatic: No lymphadenopathy.   Mesenteric: Trace fluid measuring 20 Hounsfield units adjacent to the spleen and the liver. Trace fluid within the pericolic gutter bilaterally. Extensive edema within the small bowel mesentery associated with a nonenhancing bowel loops. Trace free fluid within the anatomic pelvis. No free air. No portal venous gas.   Reproductive: Changes of hysterectomy   Other: Small fat containing umbilical hernia.   Musculoskeletal: Surgical changes of right hip arthroplasty with significant streak artifact limiting evaluation of the pelvis. Degenerative changes of the left hip. Degenerative changes of the visualized thoracolumbar spine. No acute displaced fracture. Osteopenia.   IMPRESSION: Small bowel ischemia within the mid and lower abdomen, demonstrating edematous, nonenhancing pattern. This is favored to be secondary to mesenteric adhesions/torsion, and not from thromboembolic arterial disease. Surgical consultation is indicated.   CT is negative for evidence of acute gastrointestinal hemorrhage.   Small volume reactive ascites.   These above preliminary results were discussed by telephone at the time of interpretation on 02/08/2020 at 2:17 pm with Dr. Carmin Muskrat  ASSESSMENT/PLAN: 87 year old female known to me though not seen since January 2018 with a history of GERD, cricopharyngeal achalasia (previously treated with Botox), history of segmental colitis associated with diverticulosis treated for some time with mesalamine, interstitial lung disease/hypersensitivity pneumonitis, atrial fibrillation on Eliquis (previously warfarin),  hypertension, lumbar disc disease, and small bowel resection due to ischemia in 2021 who is seen for follow-up and to reestablish care.   GERD/cough/throat clearing/globus sensation/possible  cricopharyngeal achalasia --she has been off of PPI and I am suspicious that most of her symptoms are due to uncontrolled reflux.  At last attempted EGD in 2013 Dr. Sharlett Iles was unable to intubate the esophagus later found out to be secondary to a hypertonic cricopharyngeus muscle as seen by barium esophagram.  My note states that she received Botox for this in the past though the patient did not recall this today.  She has been seen by ENT of late but without any Botox injections.  I recommended the following: --Resume omeprazole 40 mg daily; we reviewed the risks and benefits of PPI and for her I feel that the risks are outweighed by the potential benefits --If persistent symptoms consider repeat barium esophagram at follow-up --We would reserve any upper endoscopy for unresolving or uncontrolled symptoms or abnormality by barium esophagram --Possibly investigate need for cricopharyngeus treatment depending on response  2.  Bowel resection/bowel ischemia --bowel ischemia last year secondary to adhesive disease causing bowel obstruction.  She has recovered from the surgery to this point without overt alarm symptoms.  The CTA done at that time was reviewed.  3.  Constipation/diverticulosis/history of segmental colitis/lower abdominal pain--no segmental colitis symptoms and she has been off of mesalamine for at least 4 years.  She did not have response to MiraLAX and it is likely that her constipation is leading to her lower abdominal pain.  Based on exam I am not suspicious for diverticulitis at this time. -- Begin Metamucil 2 teaspoons daily working to twice daily --If ineffective consider low-dose lubiprostone or Linzess  6 week follow-up with me    [id number], [place], [place],  [place] [id number]

## 2021-02-16 DIAGNOSIS — Z9181 History of falling: Secondary | ICD-10-CM | POA: Diagnosis not present

## 2021-02-16 DIAGNOSIS — R2689 Other abnormalities of gait and mobility: Secondary | ICD-10-CM | POA: Diagnosis not present

## 2021-02-23 DIAGNOSIS — Z9181 History of falling: Secondary | ICD-10-CM | POA: Diagnosis not present

## 2021-02-23 DIAGNOSIS — R2689 Other abnormalities of gait and mobility: Secondary | ICD-10-CM | POA: Diagnosis not present

## 2021-02-28 DIAGNOSIS — Z9181 History of falling: Secondary | ICD-10-CM | POA: Diagnosis not present

## 2021-02-28 DIAGNOSIS — R2689 Other abnormalities of gait and mobility: Secondary | ICD-10-CM | POA: Diagnosis not present

## 2021-03-03 ENCOUNTER — Telehealth: Payer: Self-pay | Admitting: Internal Medicine

## 2021-03-03 DIAGNOSIS — G4734 Idiopathic sleep related nonobstructive alveolar hypoventilation: Secondary | ICD-10-CM

## 2021-03-03 DIAGNOSIS — R2689 Other abnormalities of gait and mobility: Secondary | ICD-10-CM | POA: Diagnosis not present

## 2021-03-03 DIAGNOSIS — Z9181 History of falling: Secondary | ICD-10-CM | POA: Diagnosis not present

## 2021-03-03 NOTE — Telephone Encounter (Signed)
Overnight oxygen study done on 03/01/2021 on room air.  Shows pulse ox less than equal to 88% at 32 minutes no bradycardia.  Tachycardia time was only 9 minutes.  Plan - Patient can do 1 L nasal cannula at night. - We can see if this strategy helps her feel more refreshed and more energetic in the daytime

## 2021-03-07 NOTE — Telephone Encounter (Signed)
Attempted to call pt but unable to reach. Left message for her to return call. 

## 2021-03-09 DIAGNOSIS — R2689 Other abnormalities of gait and mobility: Secondary | ICD-10-CM | POA: Diagnosis not present

## 2021-03-09 DIAGNOSIS — Z9181 History of falling: Secondary | ICD-10-CM | POA: Diagnosis not present

## 2021-03-10 DIAGNOSIS — R2689 Other abnormalities of gait and mobility: Secondary | ICD-10-CM | POA: Diagnosis not present

## 2021-03-10 DIAGNOSIS — Z9181 History of falling: Secondary | ICD-10-CM | POA: Diagnosis not present

## 2021-03-13 ENCOUNTER — Telehealth: Payer: Self-pay | Admitting: Internal Medicine

## 2021-03-13 NOTE — Telephone Encounter (Signed)
Patient called states she is having issues with whipping and is seeking advise

## 2021-03-13 NOTE — Telephone Encounter (Signed)
Pt stated that her left leg was swollen and weeping. Pt was confused about what Dr. that she needed to see. Pt was encouraged to contact her PCP.

## 2021-03-15 NOTE — Telephone Encounter (Signed)
Attempted to call pt x2 but each time when the line is ringing, it goes to a busy signal after three rings. Unable to leave VM. Will try to call back later.

## 2021-03-16 DIAGNOSIS — F419 Anxiety disorder, unspecified: Secondary | ICD-10-CM | POA: Diagnosis not present

## 2021-03-16 DIAGNOSIS — L03116 Cellulitis of left lower limb: Secondary | ICD-10-CM | POA: Diagnosis not present

## 2021-03-16 DIAGNOSIS — R609 Edema, unspecified: Secondary | ICD-10-CM | POA: Diagnosis not present

## 2021-03-16 DIAGNOSIS — L299 Pruritus, unspecified: Secondary | ICD-10-CM | POA: Diagnosis not present

## 2021-03-16 DIAGNOSIS — Z9181 History of falling: Secondary | ICD-10-CM | POA: Diagnosis not present

## 2021-03-16 DIAGNOSIS — R2689 Other abnormalities of gait and mobility: Secondary | ICD-10-CM | POA: Diagnosis not present

## 2021-03-16 NOTE — Telephone Encounter (Signed)
Received call from patient and her daughter, provided ONO results/recommendations per MR.  They used [facility] in the past and would like to use them again.  Advised I would place the order and then [facility] will call them to get it set up.  They wanted to know about setting up  her f/u appointment with Dr. Chase Caller.  She was seen in June and she is to f/u in 3-4 month.  Advised I can set them up today, they were agreeable to making an appointment whil I had them on the phone.  Scheduled to see MR on Monday Oct 24th at 2 pm, advised to arrive by 1:45 pm for check in.  They verbalized understanding.  Nothing further needed.

## 2021-03-21 ENCOUNTER — Other Ambulatory Visit: Payer: Self-pay

## 2021-03-21 ENCOUNTER — Telehealth: Payer: Self-pay | Admitting: Internal Medicine

## 2021-03-21 DIAGNOSIS — R1030 Lower abdominal pain, unspecified: Secondary | ICD-10-CM

## 2021-03-21 DIAGNOSIS — K219 Gastro-esophageal reflux disease without esophagitis: Secondary | ICD-10-CM

## 2021-03-21 MED ORDER — OMEPRAZOLE 40 MG PO CPDR: 40.0000 mg | DELAYED_RELEASE_CAPSULE | Freq: Two times a day (BID) | ORAL | 3 refills | Status: DC

## 2021-03-21 NOTE — Telephone Encounter (Signed)
Patient called is having issues with nausea the Prilosec is not working seeking advise.

## 2021-03-21 NOTE — Telephone Encounter (Signed)
I would recommend the following: CBC, CMP Increase omeprazole to 40 mg BID-AC (currently once daily) RUQ [place], rule of gallbladder disease Have her update me on symptoms in 1-2 weeks, I too see she has followup early next month

## 2021-03-21 NOTE — Telephone Encounter (Signed)
Orders for labs placed in Epic. Pt made aware of the need to come to  the Lab in the basement.  Omeprazole prescription sent to pharmacy. Pt made aware of the change in the prescription. Ultrasound Order in Epic, Radiology Scheduling to contact pt with date and times. Pt made aware of of all Dr. Hilarie Fredrickson recommendations.

## 2021-03-21 NOTE — Telephone Encounter (Signed)
Pt states that she is having nausea/ vomiting that comes and goes after eating. Pt states that the Prilosec is not working. Pt states that she has been having lots of phlegm. Pt states that she is also experiencing Right sided abdominal pain. Please advise

## 2021-03-29 ENCOUNTER — Other Ambulatory Visit (INDEPENDENT_AMBULATORY_CARE_PROVIDER_SITE_OTHER): Payer: Medicare Other

## 2021-03-29 DIAGNOSIS — K219 Gastro-esophageal reflux disease without esophagitis: Secondary | ICD-10-CM | POA: Diagnosis not present

## 2021-03-29 DIAGNOSIS — R1030 Lower abdominal pain, unspecified: Secondary | ICD-10-CM | POA: Diagnosis not present

## 2021-03-29 LAB — COMPREHENSIVE METABOLIC PANEL
ALT: 10 U/L (ref 0–35)
AST: 16 U/L (ref 0–37)
Albumin: 3.7 g/dL (ref 3.5–5.2)
Alkaline Phosphatase: 131 U/L — ABNORMAL HIGH (ref 39–117)
BUN: 9 mg/dL (ref 6–23)
CO2: 27 mEq/L (ref 19–32)
Calcium: 9.2 mg/dL (ref 8.4–10.5)
Chloride: 97 mEq/L (ref 96–112)
Creatinine, Ser: 0.51 mg/dL (ref 0.40–1.20)
GFR: 84.97 mL/min (ref >60.00)
Glucose, Bld: 89 mg/dL (ref 70–99)
Potassium: 4.4 mEq/L (ref 3.5–5.1)
Sodium: 131 mEq/L — ABNORMAL LOW (ref 135–145)
Total Bilirubin: 0.7 mg/dL (ref 0.2–1.2)
Total Protein: 7.3 g/dL (ref 6.0–8.3)

## 2021-03-29 LAB — CBC WITH DIFFERENTIAL/PLATELET
Basophils Absolute: 0.1 10*3/uL (ref 0.0–0.1)
Basophils Relative: 1.1 % (ref 0.0–3.0)
Eosinophils Absolute: 0.5 10*3/uL (ref 0.0–0.7)
Eosinophils Relative: 9 % — ABNORMAL HIGH (ref 0.0–5.0)
HCT: 32.1 % — ABNORMAL LOW (ref 36.0–46.0)
Hemoglobin: 10.2 g/dL — ABNORMAL LOW (ref 12.0–15.0)
Lymphocytes Relative: 20.3 % (ref 12.0–46.0)
Lymphs Abs: 1.2 10*3/uL (ref 0.7–4.0)
MCHC: 31.7 g/dL (ref 30.0–36.0)
MCV: 84.4 fl (ref 78.0–100.0)
Monocytes Absolute: 0.6 10*3/uL (ref 0.1–1.0)
Monocytes Relative: 10.5 % (ref 3.0–12.0)
Neutro Abs: 3.5 10*3/uL (ref 1.4–7.7)
Neutrophils Relative %: 59.1 % (ref 43.0–77.0)
Platelets: 342 10*3/uL (ref 150.0–400.0)
RBC: 3.81 Mil/uL — ABNORMAL LOW (ref 3.87–5.11)
RDW: 15.4 % (ref 11.5–15.5)
WBC: 5.9 10*3/uL (ref 4.0–10.5)

## 2021-03-30 ENCOUNTER — Telehealth: Payer: Self-pay | Admitting: Internal Medicine

## 2021-03-30 NOTE — Telephone Encounter (Signed)
Patient returned your call about results, please call patient one more time.   

## 2021-03-31 NOTE — Telephone Encounter (Signed)
See result note.  

## 2021-04-04 ENCOUNTER — Ambulatory Visit (INDEPENDENT_AMBULATORY_CARE_PROVIDER_SITE_OTHER): Payer: Medicare Other | Admitting: Internal Medicine

## 2021-04-04 ENCOUNTER — Encounter: Payer: Self-pay | Admitting: Internal Medicine

## 2021-04-04 VITALS — BP 120/70 | HR 88 | Ht 66.0 in | Wt 163.0 lb

## 2021-04-04 DIAGNOSIS — R1031 Right lower quadrant pain: Secondary | ICD-10-CM | POA: Diagnosis not present

## 2021-04-04 DIAGNOSIS — K219 Gastro-esophageal reflux disease without esophagitis: Secondary | ICD-10-CM | POA: Diagnosis not present

## 2021-04-04 DIAGNOSIS — K5909 Other constipation: Secondary | ICD-10-CM

## 2021-04-04 NOTE — Patient Instructions (Signed)
You have been scheduled for a CT scan of the abdomen and pelvis at [facility], 1st floor Radiology. You are scheduled on 04/07/21  at 8:30am. You should arrive 15 minutes prior to your appointment time for registration. . The solution may taste better if refrigerated, but do NOT add ice or any other liquid to this solution. Shake well before drinking.   Please follow the written instructions below on the day of your exam:   1) Do not eat anything after 4:30am (4 hours prior to your test)   2) Drink 1 bottle of contrast @ 6:30am (2 hours prior to your exam)  Remember to shake well before drinking and do NOT pour over ice.     Drink 1 bottle of contrast @ 7:30am (1 hour prior to your exam)   You may take any medications as prescribed with a small amount of water, if necessary. If you take any of the following medications: METFORMIN, GLUCOPHAGE, GLUCOVANCE, AVANDAMET, RIOMET, FORTAMET, Cole MET, JANUMET, GLUMETZA or METAGLIP, you MAY be asked to HOLD this medication 48 hours AFTER the exam.   The purpose of you drinking the oral contrast is to aid in the visualization of your intestinal tract. The contrast solution may cause some diarrhea. Depending on your individual set of symptoms, you may also receive an intravenous injection of x-ray contrast/dye. Plan on being at [facility] for 45 minutes or longer, depending on the type of exam you are having performed.   If you have any questions regarding your exam or if you need to reschedule, you may call [facility] at [phone] between the hours of 8:00 am and 5:00 pm, Monday-Friday.   Continue Omeprazole -twice daily.   Continue Metamucil as directed.   Follow up in 3-4 months. Office will contact you to schedule at a later time.   If you are age 43 or older, your body mass index should be between 23-30. Your Body mass index is 26.31 kg/m. If this is out of the aforementioned range listed, please consider follow up with  your Primary Care Provider.   __________________________________________________________  The [facility] providers would like to encourage you to use [facility] to communicate with providers for non-urgent requests or questions.  Due to long hold times on the telephone, sending your provider a message by [facility] may be a faster and more efficient way to get a response.  Please allow 48 business hours for a response.  Please remember that this is for non-urgent requests.   Thank you for choosing me and [facility].  Dr. Ulice Dash Pyrtle

## 2021-04-04 NOTE — Progress Notes (Signed)
Subjective:    Patient ID: Stephanie Cortez, female    DOB: 11/09/1933, 87 y.o.   MRN: [id number]  HPI Laiyla Slagel is an 87 year old female with a history of GERD, cricopharyngeal achalasia (previously treated with Botox), history of segmental colitis associated with diverticulosis treated some time with mesalamine but none recently, small bowel resection due to ischemia in 2021, history of ILD, A. fib on Eliquis, hypertension who is here for follow-up.  She is here today with her daughter.  I last saw her on 02/14/2021.  She reports that she has been doing much better of late.  Her nausea, vomiting and upper abdominal pain has improved dramatically with omeprazole 40 mg twice daily.  We recently increase this to twice daily and symptoms have come under much better control.  She is using Metamucil and noticed increased regularity and easier bowel movements.  Her abdominal pain overall is better but she is still having some right lower quadrant abdominal pain.  This is worse with movement.  Does not seem to vary with bowel movements.  Appetite overall fluctuates but she is able to eat a full meal.  She enjoys eating snicker bars.   Review of Systems As per HPI, otherwise negative  Current Medications, Allergies, Past Medical History, Past Surgical History, Family History and Social History were reviewed in [employer] record.    Objective:   Physical Exam BP 120/70   Pulse 88   Ht 5\' 6"  (1.676 m)   Wt 163 lb (73.9 kg)   LMP  (LMP Unknown)   BMI 26.31 kg/m  Gen: awake, alert, NAD HEENT: anicteric CV: RRR, soft sem Abd: soft, right lower quadrant tenderness to palpation without rebound or guarding, nondistended, +BS throughout Ext: no c/c, trace pretibial edema Msk: I observed her gait and her gait is somewhat shuffling with the right lower extremity externally rotated at the hip, there is tenderness in the inguinal canal right hip with leg raise and both internal  and external rotation. Neuro: nonfocal  CBC    Component Value Date/Time   WBC 5.9 03/29/2021 1618   RBC 3.81 (L) 03/29/2021 1618   HGB 10.2 (L) 03/29/2021 1618   HGB 11.3 02/16/2019 1452   HCT 32.1 (L) 03/29/2021 1618   HCT 33.9 (L) 02/16/2019 1452   PLT 342.0 03/29/2021 1618   PLT 256 02/16/2019 1452   MCV 84.4 03/29/2021 1618   MCV 92 02/16/2019 1452   MCH 28.8 02/29/2020 0449   MCHC 31.7 03/29/2021 1618   RDW 15.4 03/29/2021 1618   RDW 12.1 02/16/2019 1452   LYMPHSABS 1.2 03/29/2021 1618   MONOABS 0.6 03/29/2021 1618   EOSABS 0.5 03/29/2021 1618   BASOSABS 0.1 03/29/2021 1618   CMP     Component Value Date/Time   NA 131 (L) 03/29/2021 1618   NA 130 (L) 02/16/2019 1452   K 4.4 03/29/2021 1618   CL 97 03/29/2021 1618   CO2 27 03/29/2021 1618   GLUCOSE 89 03/29/2021 1618   BUN 9 03/29/2021 1618   BUN 13 02/16/2019 1452   CREATININE 0.51 03/29/2021 1618   CALCIUM 9.2 03/29/2021 1618   PROT 7.3 03/29/2021 1618   ALBUMIN 3.7 03/29/2021 1618   AST 16 03/29/2021 1618   ALT 10 03/29/2021 1618   ALKPHOS 131 (H) 03/29/2021 1618   BILITOT 0.7 03/29/2021 1618   GFRNONAA >60 02/29/2020 0449   GFRAA >60 02/29/2020 0449      Assessment & Plan:  87 year old female  with a history of GERD, cricopharyngeal achalasia (previously treated with Botox), history of segmental colitis associated with diverticulosis treated some time with mesalamine but none recently, small bowel resection due to ischemia in 2021, history of ILD, A. fib on Eliquis, hypertension who is here for follow-up.   GERD/cough/throat clearing/globus sensation/nausea--symptoms significantly improved with twice daily omeprazole --Continue omeprazole 40 mg twice daily AC --Some of her "phlegm" may be secondary to her ILD but could also be postnasal drip.  I asked her to talk about her postnasal drip symptoms with Dr. Tamala Julian her primary care provider.  2.  Constipation/history of diverticulosis --much improved with  Metamucil, continue Metamucil daily  3.  Right lower quadrant pain --I am highly suspicious that this is referred pain from her right hip based on her gait and my exam today.  Pain is also worse with movement.  That said given her history of diverticulosis, bowel ischemia in the distal bowel requiring resection I have recommended we repeat CT scan of the abdomen pelvis with contrast. --CT scan abdomen pelvis with contrast  Follow-up in 3 to 4 months which can be extended to a 50-month interval if all stable and she is well  30 minutes total spent today including patient facing time, coordination of care, reviewing medical history/procedures/pertinent radiology studies, and documentation of the encounter.

## 2021-04-04 NOTE — Progress Notes (Signed)
RLQ

## 2021-04-07 ENCOUNTER — Encounter (HOSPITAL_COMMUNITY): Payer: Self-pay

## 2021-04-07 ENCOUNTER — Other Ambulatory Visit: Payer: Self-pay

## 2021-04-07 ENCOUNTER — Ambulatory Visit (HOSPITAL_COMMUNITY)
Admission: RE | Admit: 2021-04-07 | Discharge: 2021-04-07 | Disposition: A | Discharge status: Home / Self Care | Payer: Medicare Other | Source: Ambulatory Visit | Attending: Internal Medicine | Admitting: Internal Medicine

## 2021-04-07 DIAGNOSIS — R1031 Right lower quadrant pain: Secondary | ICD-10-CM | POA: Diagnosis not present

## 2021-04-07 DIAGNOSIS — D7389 Other diseases of spleen: Secondary | ICD-10-CM | POA: Diagnosis not present

## 2021-04-07 DIAGNOSIS — I700 Atherosclerosis of aorta: Secondary | ICD-10-CM | POA: Diagnosis not present

## 2021-04-07 DIAGNOSIS — K7689 Other specified diseases of liver: Secondary | ICD-10-CM | POA: Diagnosis not present

## 2021-04-07 DIAGNOSIS — K439 Ventral hernia without obstruction or gangrene: Secondary | ICD-10-CM | POA: Diagnosis not present

## 2021-04-07 MED ORDER — IOHEXOL 350 MG/ML SOLN
80.0000 mL | Freq: Once | INTRAVENOUS | Status: AC | PRN
  Administered 2021-04-07: 80 mL via INTRAVENOUS

## 2021-04-10 DIAGNOSIS — B351 Tinea unguium: Secondary | ICD-10-CM | POA: Diagnosis not present

## 2021-04-10 DIAGNOSIS — I739 Peripheral vascular disease, unspecified: Secondary | ICD-10-CM | POA: Diagnosis not present

## 2021-04-12 ENCOUNTER — Ambulatory Visit (HOSPITAL_COMMUNITY): Admission: RE | Admit: 2021-04-12 | Payer: Medicare Other | Source: Ambulatory Visit

## 2021-04-18 ENCOUNTER — Telehealth: Payer: Self-pay | Admitting: Internal Medicine

## 2021-04-18 NOTE — Telephone Encounter (Signed)
Pt is going to see Dr. [name] at [facility] on 04/20/21 at 2 p.m. and would like her bloodwork, CT scan results, and last office visit notes sent for the appointment.  The office phone number is [phone].

## 2021-04-21 ENCOUNTER — Ambulatory Visit (HOSPITAL_COMMUNITY)
Admission: RE | Admit: 2021-04-21 | Discharge: 2021-04-21 | Disposition: A | Discharge status: Home / Self Care | Payer: Medicare Other | Source: Ambulatory Visit | Attending: Internal Medicine | Admitting: Internal Medicine

## 2021-04-21 ENCOUNTER — Other Ambulatory Visit: Payer: Self-pay

## 2021-04-21 DIAGNOSIS — K219 Gastro-esophageal reflux disease without esophagitis: Secondary | ICD-10-CM | POA: Diagnosis not present

## 2021-04-21 DIAGNOSIS — R1030 Lower abdominal pain, unspecified: Secondary | ICD-10-CM | POA: Insufficient documentation

## 2021-04-21 DIAGNOSIS — K760 Fatty (change of) liver, not elsewhere classified: Secondary | ICD-10-CM | POA: Diagnosis not present

## 2021-04-24 ENCOUNTER — Ambulatory Visit: Payer: Medicare Other | Admitting: Internal Medicine

## 2021-04-27 DIAGNOSIS — J849 Interstitial pulmonary disease, unspecified: Secondary | ICD-10-CM | POA: Diagnosis not present

## 2021-04-27 DIAGNOSIS — Z23 Encounter for immunization: Secondary | ICD-10-CM | POA: Diagnosis not present

## 2021-04-27 DIAGNOSIS — E782 Mixed hyperlipidemia: Secondary | ICD-10-CM | POA: Diagnosis not present

## 2021-04-27 DIAGNOSIS — E039 Hypothyroidism, unspecified: Secondary | ICD-10-CM | POA: Diagnosis not present

## 2021-04-27 DIAGNOSIS — I10 Essential (primary) hypertension: Secondary | ICD-10-CM | POA: Diagnosis not present

## 2021-04-27 DIAGNOSIS — F419 Anxiety disorder, unspecified: Secondary | ICD-10-CM | POA: Diagnosis not present

## 2021-04-27 DIAGNOSIS — I480 Paroxysmal atrial fibrillation: Secondary | ICD-10-CM | POA: Diagnosis not present

## 2021-04-27 DIAGNOSIS — R54 Age-related physical debility: Secondary | ICD-10-CM | POA: Diagnosis not present

## 2021-04-27 DIAGNOSIS — R609 Edema, unspecified: Secondary | ICD-10-CM | POA: Diagnosis not present

## 2021-05-05 ENCOUNTER — Telehealth: Payer: Self-pay | Admitting: Internal Medicine

## 2021-05-05 ENCOUNTER — Other Ambulatory Visit: Payer: Self-pay | Admitting: Internal Medicine

## 2021-05-05 NOTE — Telephone Encounter (Signed)
Patient has upcoming appt with Dr. Chase Caller on 05/15/21. Symbicort refill sent to [facility]. The patient and her daughter have both been notified.

## 2021-05-08 DIAGNOSIS — E871 Hypo-osmolality and hyponatremia: Secondary | ICD-10-CM | POA: Diagnosis not present

## 2021-05-15 ENCOUNTER — Telehealth: Payer: Self-pay | Admitting: Internal Medicine

## 2021-05-15 ENCOUNTER — Ambulatory Visit: Payer: Medicare Other | Admitting: Internal Medicine

## 2021-05-15 NOTE — Telephone Encounter (Signed)
Ok for video

## 2021-05-15 NOTE — Telephone Encounter (Signed)
Please advise if ok to change to a Mychart and not in person?

## 2021-05-15 NOTE — Telephone Encounter (Signed)
I have changed the visit for 12.13 to the mychart visit.  Nothing further is needed.

## 2021-06-05 ENCOUNTER — Telehealth: Payer: Self-pay

## 2021-06-05 NOTE — Telephone Encounter (Signed)
(  2:43 pm) SW left a message for patient's  daughter-Stephanie Cortez requesting a call back to set up initial palliative care visit.

## 2021-06-07 ENCOUNTER — Telehealth: Payer: Self-pay

## 2021-06-07 DIAGNOSIS — Z515 Encounter for palliative care: Secondary | ICD-10-CM

## 2021-06-07 NOTE — Telephone Encounter (Signed)
(  4:35 pm) SW left a message for patient's daughter-[name] requesting a call back to schedule initial visit with patient.

## 2021-06-09 ENCOUNTER — Telehealth: Payer: Self-pay

## 2021-06-09 DIAGNOSIS — Z515 Encounter for palliative care: Secondary | ICD-10-CM

## 2021-06-09 NOTE — Telephone Encounter (Signed)
(  3:10 pm) SW returned call to daughter-[name] and left a message requesting a call back to schedule a visit for patient.

## 2021-06-13 ENCOUNTER — Telehealth: Payer: Medicare Other | Admitting: Internal Medicine

## 2021-06-16 DIAGNOSIS — R300 Dysuria: Secondary | ICD-10-CM | POA: Diagnosis not present

## 2021-06-17 ENCOUNTER — Other Ambulatory Visit: Payer: Self-pay

## 2021-06-17 ENCOUNTER — Emergency Department (HOSPITAL_COMMUNITY): Payer: Medicare Other

## 2021-06-17 ENCOUNTER — Encounter (HOSPITAL_COMMUNITY): Payer: Self-pay | Admitting: Internal Medicine

## 2021-06-17 ENCOUNTER — Inpatient Hospital Stay (HOSPITAL_COMMUNITY)
Admission: EM | Admit: 2021-06-17 | Discharge: 2021-06-28 | DRG: 871 | Disposition: A | Discharge status: Skilled Nursing Facility | Payer: Medicare Other | Attending: Internal Medicine | Admitting: Internal Medicine

## 2021-06-17 DIAGNOSIS — Z800 Family history of malignant neoplasm of digestive organs: Secondary | ICD-10-CM | POA: Diagnosis not present

## 2021-06-17 DIAGNOSIS — J470 Bronchiectasis with acute lower respiratory infection: Secondary | ICD-10-CM | POA: Diagnosis present

## 2021-06-17 DIAGNOSIS — R1319 Other dysphagia: Secondary | ICD-10-CM | POA: Diagnosis not present

## 2021-06-17 DIAGNOSIS — W19XXXA Unspecified fall, initial encounter: Secondary | ICD-10-CM | POA: Diagnosis not present

## 2021-06-17 DIAGNOSIS — R0902 Hypoxemia: Secondary | ICD-10-CM | POA: Diagnosis not present

## 2021-06-17 DIAGNOSIS — R531 Weakness: Secondary | ICD-10-CM | POA: Diagnosis not present

## 2021-06-17 DIAGNOSIS — R6520 Severe sepsis without septic shock: Secondary | ICD-10-CM | POA: Diagnosis present

## 2021-06-17 DIAGNOSIS — E039 Hypothyroidism, unspecified: Secondary | ICD-10-CM | POA: Diagnosis present

## 2021-06-17 DIAGNOSIS — I5032 Chronic diastolic (congestive) heart failure: Secondary | ICD-10-CM | POA: Diagnosis not present

## 2021-06-17 DIAGNOSIS — R52 Pain, unspecified: Secondary | ICD-10-CM | POA: Diagnosis not present

## 2021-06-17 DIAGNOSIS — Z9981 Dependence on supplemental oxygen: Secondary | ICD-10-CM

## 2021-06-17 DIAGNOSIS — J8410 Pulmonary fibrosis, unspecified: Secondary | ICD-10-CM | POA: Diagnosis not present

## 2021-06-17 DIAGNOSIS — G629 Polyneuropathy, unspecified: Secondary | ICD-10-CM | POA: Diagnosis present

## 2021-06-17 DIAGNOSIS — A419 Sepsis, unspecified organism: Secondary | ICD-10-CM | POA: Diagnosis not present

## 2021-06-17 DIAGNOSIS — J9621 Acute and chronic respiratory failure with hypoxia: Secondary | ICD-10-CM | POA: Diagnosis present

## 2021-06-17 DIAGNOSIS — Z28310 Unvaccinated for covid-19: Secondary | ICD-10-CM | POA: Diagnosis not present

## 2021-06-17 DIAGNOSIS — K224 Dyskinesia of esophagus: Secondary | ICD-10-CM | POA: Diagnosis not present

## 2021-06-17 DIAGNOSIS — K229 Disease of esophagus, unspecified: Secondary | ICD-10-CM | POA: Diagnosis not present

## 2021-06-17 DIAGNOSIS — H409 Unspecified glaucoma: Secondary | ICD-10-CM | POA: Diagnosis present

## 2021-06-17 DIAGNOSIS — F32A Depression, unspecified: Secondary | ICD-10-CM | POA: Diagnosis present

## 2021-06-17 DIAGNOSIS — E871 Hypo-osmolality and hyponatremia: Secondary | ICD-10-CM | POA: Diagnosis present

## 2021-06-17 DIAGNOSIS — R5381 Other malaise: Secondary | ICD-10-CM | POA: Diagnosis not present

## 2021-06-17 DIAGNOSIS — U071 COVID-19: Secondary | ICD-10-CM | POA: Diagnosis present

## 2021-06-17 DIAGNOSIS — Z7901 Long term (current) use of anticoagulants: Secondary | ICD-10-CM | POA: Diagnosis not present

## 2021-06-17 DIAGNOSIS — R059 Cough, unspecified: Secondary | ICD-10-CM | POA: Diagnosis not present

## 2021-06-17 DIAGNOSIS — Z96641 Presence of right artificial hip joint: Secondary | ICD-10-CM | POA: Diagnosis not present

## 2021-06-17 DIAGNOSIS — Z825 Family history of asthma and other chronic lower respiratory diseases: Secondary | ICD-10-CM

## 2021-06-17 DIAGNOSIS — I4811 Longstanding persistent atrial fibrillation: Secondary | ICD-10-CM

## 2021-06-17 DIAGNOSIS — I482 Chronic atrial fibrillation, unspecified: Secondary | ICD-10-CM | POA: Diagnosis present

## 2021-06-17 DIAGNOSIS — K589 Irritable bowel syndrome without diarrhea: Secondary | ICD-10-CM | POA: Diagnosis not present

## 2021-06-17 DIAGNOSIS — J9600 Acute respiratory failure, unspecified whether with hypoxia or hypercapnia: Secondary | ICD-10-CM | POA: Diagnosis not present

## 2021-06-17 DIAGNOSIS — F419 Anxiety disorder, unspecified: Secondary | ICD-10-CM | POA: Diagnosis present

## 2021-06-17 DIAGNOSIS — K220 Achalasia of cardia: Secondary | ICD-10-CM

## 2021-06-17 DIAGNOSIS — J159 Unspecified bacterial pneumonia: Secondary | ICD-10-CM | POA: Diagnosis present

## 2021-06-17 DIAGNOSIS — K5790 Diverticulosis of intestine, part unspecified, without perforation or abscess without bleeding: Secondary | ICD-10-CM | POA: Diagnosis not present

## 2021-06-17 DIAGNOSIS — Z7951 Long term (current) use of inhaled steroids: Secondary | ICD-10-CM

## 2021-06-17 DIAGNOSIS — I4510 Unspecified right bundle-branch block: Secondary | ICD-10-CM | POA: Diagnosis present

## 2021-06-17 DIAGNOSIS — L89151 Pressure ulcer of sacral region, stage 1: Secondary | ICD-10-CM | POA: Diagnosis present

## 2021-06-17 DIAGNOSIS — J9601 Acute respiratory failure with hypoxia: Secondary | ICD-10-CM | POA: Diagnosis not present

## 2021-06-17 DIAGNOSIS — D709 Neutropenia, unspecified: Secondary | ICD-10-CM | POA: Diagnosis present

## 2021-06-17 DIAGNOSIS — J1282 Pneumonia due to coronavirus disease 2019: Secondary | ICD-10-CM | POA: Diagnosis present

## 2021-06-17 DIAGNOSIS — L299 Pruritus, unspecified: Secondary | ICD-10-CM | POA: Diagnosis not present

## 2021-06-17 DIAGNOSIS — I110 Hypertensive heart disease with heart failure: Secondary | ICD-10-CM | POA: Diagnosis present

## 2021-06-17 DIAGNOSIS — R0602 Shortness of breath: Secondary | ICD-10-CM | POA: Diagnosis not present

## 2021-06-17 DIAGNOSIS — Z90710 Acquired absence of both cervix and uterus: Secondary | ICD-10-CM

## 2021-06-17 DIAGNOSIS — Z823 Family history of stroke: Secondary | ICD-10-CM

## 2021-06-17 DIAGNOSIS — E785 Hyperlipidemia, unspecified: Secondary | ICD-10-CM | POA: Diagnosis present

## 2021-06-17 DIAGNOSIS — L89621 Pressure ulcer of left heel, stage 1: Secondary | ICD-10-CM | POA: Diagnosis present

## 2021-06-17 DIAGNOSIS — A4189 Other specified sepsis: Principal | ICD-10-CM | POA: Diagnosis present

## 2021-06-17 DIAGNOSIS — Z888 Allergy status to other drugs, medicaments and biological substances status: Secondary | ICD-10-CM

## 2021-06-17 DIAGNOSIS — K219 Gastro-esophageal reflux disease without esophagitis: Secondary | ICD-10-CM | POA: Diagnosis not present

## 2021-06-17 DIAGNOSIS — J449 Chronic obstructive pulmonary disease, unspecified: Secondary | ICD-10-CM

## 2021-06-17 DIAGNOSIS — R1310 Dysphagia, unspecified: Secondary | ICD-10-CM

## 2021-06-17 DIAGNOSIS — Z743 Need for continuous supervision: Secondary | ICD-10-CM | POA: Diagnosis not present

## 2021-06-17 DIAGNOSIS — Z79890 Hormone replacement therapy (postmenopausal): Secondary | ICD-10-CM

## 2021-06-17 DIAGNOSIS — J129 Viral pneumonia, unspecified: Secondary | ICD-10-CM | POA: Diagnosis not present

## 2021-06-17 DIAGNOSIS — I4891 Unspecified atrial fibrillation: Secondary | ICD-10-CM | POA: Diagnosis present

## 2021-06-17 DIAGNOSIS — R933 Abnormal findings on diagnostic imaging of other parts of digestive tract: Secondary | ICD-10-CM | POA: Diagnosis not present

## 2021-06-17 DIAGNOSIS — Z8371 Family history of colonic polyps: Secondary | ICD-10-CM

## 2021-06-17 DIAGNOSIS — Z8249 Family history of ischemic heart disease and other diseases of the circulatory system: Secondary | ICD-10-CM

## 2021-06-17 DIAGNOSIS — Z841 Family history of disorders of kidney and ureter: Secondary | ICD-10-CM

## 2021-06-17 DIAGNOSIS — D649 Anemia, unspecified: Secondary | ICD-10-CM | POA: Diagnosis not present

## 2021-06-17 DIAGNOSIS — Z885 Allergy status to narcotic agent status: Secondary | ICD-10-CM

## 2021-06-17 DIAGNOSIS — Z79899 Other long term (current) drug therapy: Secondary | ICD-10-CM | POA: Diagnosis not present

## 2021-06-17 DIAGNOSIS — K5900 Constipation, unspecified: Secondary | ICD-10-CM | POA: Diagnosis not present

## 2021-06-17 DIAGNOSIS — J479 Bronchiectasis, uncomplicated: Secondary | ICD-10-CM | POA: Diagnosis not present

## 2021-06-17 DIAGNOSIS — I10 Essential (primary) hypertension: Secondary | ICD-10-CM | POA: Diagnosis not present

## 2021-06-17 DIAGNOSIS — L8990 Pressure ulcer of unspecified site, unspecified stage: Secondary | ICD-10-CM | POA: Insufficient documentation

## 2021-06-17 DIAGNOSIS — I4821 Permanent atrial fibrillation: Secondary | ICD-10-CM | POA: Diagnosis present

## 2021-06-17 DIAGNOSIS — R1110 Vomiting, unspecified: Secondary | ICD-10-CM | POA: Diagnosis not present

## 2021-06-17 DIAGNOSIS — Z881 Allergy status to other antibiotic agents status: Secondary | ICD-10-CM

## 2021-06-17 DIAGNOSIS — J849 Interstitial pulmonary disease, unspecified: Secondary | ICD-10-CM | POA: Diagnosis not present

## 2021-06-17 HISTORY — DX: COVID-19: U07.1

## 2021-06-17 LAB — RESP PANEL BY RT-PCR (FLU A&B, COVID) ARPGX2
Influenza A by PCR: NEGATIVE
Influenza B by PCR: NEGATIVE
SARS Coronavirus 2 by RT PCR: POSITIVE — AB

## 2021-06-17 LAB — URINALYSIS, ROUTINE W REFLEX MICROSCOPIC
Bacteria, UA: NONE SEEN
Bilirubin Urine: NEGATIVE
Glucose, UA: NEGATIVE mg/dL
Hgb urine dipstick: NEGATIVE
Ketones, ur: NEGATIVE mg/dL
Nitrite: NEGATIVE
Protein, ur: 30 mg/dL — AB
Specific Gravity, Urine: 1.01 (ref 1.005–1.030)
pH: 7 (ref 5.0–8.0)

## 2021-06-17 LAB — TROPONIN I (HIGH SENSITIVITY): Troponin I (High Sensitivity): 18 ng/L — ABNORMAL HIGH (ref <18)

## 2021-06-17 LAB — CBC
HCT: 34.9 % — ABNORMAL LOW (ref 36.0–46.0)
Hemoglobin: 11 g/dL — ABNORMAL LOW (ref 12.0–15.0)
MCH: 25.8 pg — ABNORMAL LOW (ref 26.0–34.0)
MCHC: 31.5 g/dL (ref 30.0–36.0)
MCV: 81.7 fL (ref 80.0–100.0)
Platelets: 224 10*3/uL (ref 150–400)
RBC: 4.27 MIL/uL (ref 3.87–5.11)
RDW: 17.1 % — ABNORMAL HIGH (ref 11.5–15.5)
WBC: 3.5 10*3/uL — ABNORMAL LOW (ref 4.0–10.5)
nRBC: 0 % (ref 0.0–0.2)

## 2021-06-17 LAB — BASIC METABOLIC PANEL
Anion gap: 8 (ref 5–15)
BUN: 9 mg/dL (ref 8–23)
CO2: 26 mmol/L (ref 22–32)
Calcium: 8.2 mg/dL — ABNORMAL LOW (ref 8.9–10.3)
Chloride: 90 mmol/L — ABNORMAL LOW (ref 98–111)
Creatinine, Ser: 0.5 mg/dL (ref 0.44–1.00)
GFR, Estimated: >60 mL/min (ref >60)
Glucose, Bld: 129 mg/dL — ABNORMAL HIGH (ref 70–99)
Potassium: 3.9 mmol/L (ref 3.5–5.1)
Sodium: 124 mmol/L — ABNORMAL LOW (ref 135–145)

## 2021-06-17 MED ORDER — FUROSEMIDE 20 MG PO TABS
20.0000 mg | ORAL_TABLET | Freq: Every day | ORAL | Status: DC
  Administered 2021-06-18 – 2021-06-28 (×11): 20 mg via ORAL
  Filled 2021-06-17 (×11): qty 1

## 2021-06-17 MED ORDER — HYDROXYZINE HCL 25 MG PO TABS
12.5000 mg | ORAL_TABLET | Freq: Three times a day (TID) | ORAL | Status: DC | PRN
  Administered 2021-06-20 – 2021-06-28 (×19): 12.5 mg via ORAL
  Filled 2021-06-17 (×20): qty 1

## 2021-06-17 MED ORDER — PREDNISONE 50 MG PO TABS
50.0000 mg | ORAL_TABLET | Freq: Every day | ORAL | Status: DC
  Administered 2021-06-21 – 2021-06-27 (×7): 50 mg via ORAL
  Filled 2021-06-17 (×7): qty 1

## 2021-06-17 MED ORDER — APIXABAN 2.5 MG PO TABS
2.5000 mg | ORAL_TABLET | Freq: Two times a day (BID) | ORAL | Status: DC
  Administered 2021-06-18 – 2021-06-28 (×22): 2.5 mg via ORAL
  Filled 2021-06-17 (×23): qty 1

## 2021-06-17 MED ORDER — ALBUTEROL SULFATE HFA 108 (90 BASE) MCG/ACT IN AERS
1.0000 | INHALATION_SPRAY | RESPIRATORY_TRACT | Status: DC | PRN
  Administered 2021-06-18 – 2021-06-22 (×3): 2 via RESPIRATORY_TRACT
  Filled 2021-06-17: qty 6.7

## 2021-06-17 MED ORDER — SODIUM CHLORIDE 0.9 % IV SOLN
100.0000 mg | Freq: Once | INTRAVENOUS | Status: AC
  Administered 2021-06-17: 100 mg via INTRAVENOUS
  Filled 2021-06-17: qty 20

## 2021-06-17 MED ORDER — SODIUM CHLORIDE 0.9 % IV SOLN
100.0000 mg | Freq: Once | INTRAVENOUS | Status: AC
  Administered 2021-06-18: 01:00:00 100 mg via INTRAVENOUS
  Filled 2021-06-17: qty 20

## 2021-06-17 MED ORDER — SODIUM CHLORIDE 0.9 % IV SOLN
100.0000 mg | Freq: Every day | INTRAVENOUS | Status: AC
  Administered 2021-06-18 – 2021-06-21 (×4): 100 mg via INTRAVENOUS
  Filled 2021-06-17 (×4): qty 20

## 2021-06-17 MED ORDER — BENZONATATE 100 MG PO CAPS
200.0000 mg | ORAL_CAPSULE | Freq: Three times a day (TID) | ORAL | Status: DC | PRN
  Administered 2021-06-18: 01:00:00 200 mg via ORAL
  Filled 2021-06-17: qty 2

## 2021-06-17 MED ORDER — PRAVASTATIN SODIUM 40 MG PO TABS
40.0000 mg | ORAL_TABLET | Freq: Every day | ORAL | Status: DC
  Administered 2021-06-18 – 2021-06-28 (×10): 40 mg via ORAL
  Filled 2021-06-17 (×11): qty 1

## 2021-06-17 MED ORDER — LEVOTHYROXINE SODIUM 50 MCG PO TABS
50.0000 ug | ORAL_TABLET | Freq: Every day | ORAL | Status: DC
  Administered 2021-06-18 – 2021-06-28 (×11): 50 ug via ORAL
  Filled 2021-06-17 (×11): qty 1

## 2021-06-17 MED ORDER — DEXAMETHASONE SODIUM PHOSPHATE 10 MG/ML IJ SOLN
10.0000 mg | Freq: Once | INTRAMUSCULAR | Status: AC
  Administered 2021-06-17: 10 mg via INTRAVENOUS
  Filled 2021-06-17: qty 1

## 2021-06-17 MED ORDER — ACETAMINOPHEN 650 MG RE SUPP: 650.0000 mg | Freq: Four times a day (QID) | RECTAL | Status: DC | PRN

## 2021-06-17 MED ORDER — ACETAMINOPHEN 325 MG PO TABS
650.0000 mg | ORAL_TABLET | Freq: Four times a day (QID) | ORAL | Status: DC | PRN
  Administered 2021-06-18 – 2021-06-24 (×10): 650 mg via ORAL
  Filled 2021-06-17 (×10): qty 2

## 2021-06-17 MED ORDER — METHYLPREDNISOLONE SODIUM SUCC 40 MG IJ SOLR
0.5000 mg/kg | Freq: Two times a day (BID) | INTRAMUSCULAR | Status: AC
  Administered 2021-06-18 – 2021-06-20 (×6): 36.4 mg via INTRAVENOUS
  Filled 2021-06-17 (×6): qty 1

## 2021-06-17 MED ORDER — IPRATROPIUM-ALBUTEROL 20-100 MCG/ACT IN AERS
1.0000 | INHALATION_SPRAY | Freq: Four times a day (QID) | RESPIRATORY_TRACT | Status: DC
  Administered 2021-06-18 – 2021-06-21 (×14): 1 via RESPIRATORY_TRACT
  Filled 2021-06-17: qty 4

## 2021-06-17 NOTE — ED Triage Notes (Signed)
Patient BIB [facility], patient fell at home 2 days ago, patient not eating as well, unable to get out of bed without help. Covid test came back today and +, productive cough x3 days, patient being evaluated for UTI, pending culture. Patient uses walker at baseline, but unable to use for past few days. Vitals WNL with EMS. Patient reports pain in right leg and hip 4/10, patient had previous surgery on this hip

## 2021-06-17 NOTE — H&P (Signed)
History and Physical    PLEASE NOTE THAT [employer]AGON DICT[employer] SOFTWARE WAS USED IN THE CONSTRUCTION OF THIS NOTE.   TUYEN UNCAPHER GNF:[id number] DOB: [birth year] DOA: 06/17/2021  PCP: [name], MD  Patient coming from: home   I have personally briefly reviewed patient's old medical records in [place]  Chief Complaint: Shortness of breath  HPI: Stephanie Cortez is a 87 y.o. female with medical history significant for chronic atrial fibrillation chronically anticoagulated on Eliquis, CHF with preserved EF, COPD, hyperlipidemia, chronic hyponatremia with baseline sodium 1 31-1 34, acquired hypothyroidism, who is admitted to [facility] on 06/17/2021 with severe COVID-19 infection after presenting from home to [facility] ED complaining of shortness of breath.   The patient reports 3 days of progressive shortness of breath associated with new onset productive cough.  Denies any associated orthopnea, PND, or new onset peripheral edema.  Reports associated subjective fever, but denies any associated chills, full body rigors, or generalized myalgias.  Has had mild rhinitis/rhinorrhea, no sore throat.  No hemoptysis.  Denies any nausea, vomiting, diarrhea, abdominal pain, rash.  Denies any recent chest pain, diaphoresis, palpitations, presyncope, or syncope.  No headache or neck stiffness.  She also denies any dysuria, gross hematuria, or change in urinary urgency/frequency.  No recent trauma or travel.  She confirms that she is not received the COVID-19 vaccine.   Return here, patient has a history of COPD, although patient notes that she is a lifelong non-smoker.  She is on 2 L nasal cannula overnight, without any diarrheal supplemental O2 requirements.  Respiratory regimen includes scheduled Symbicort.  Medical history also notable for chronic will chronic atrial fibrillation chronically anticoagulated on Eliquis, with patient reporting good compliance with this anticoagulant.  Not on any AV  nodal blocking agents at home.  Most recent echocardiogram occurred in April 2021, was notable for LVEF 60-65%, no focal wall showed maladies, inability to assess diastolic parameters in setting of atrial fibrillation, right ventricular systolic function moderately reduced, severely dilated right atrium, mild MR, and mild to moderate TR. outpatient diuretic regimen consists of Lasix 20 mg PO Qdaily.   In the setting of the above symptoms, she underwent home COVID-19 testing on 06/16/2021, which was positive.  The setting of interval progression of the above symptoms, patient elected to present to [facility] emergency department today for further evaluation and management thereof.    ED Course:  Vital signs in the ED were notable for the following: Temperature max 97.9; heart rate 87-1 10; blood pressure 143/70; respiratory rate 16-27, initial oxygen saturation 85 to 87% on room air, which subsequently improved to 100% on 5 L nasal cannula.  Labs were notable for the following: BMP notable for the following: Sodium 124 compared to most recent prior serum sodium data point of 131 on 03/29/2021, bicarbonate 26, anion gap 8, creatinine 0.5 glucose 129.  High-sensitivity troponin 18.  CBC notable for white blood cell count 3500, hemoglobin 11 compared to most recent prior value of 10.2 on 03/29/2021, platelet count 224.  Urinalysis notable for no white blood cells no bacteria, nitrite negative.  COVID-19 PCR performed in the ED today was found to be positive, will influenza A/B were negative.  Imaging and additional notable ED work-up: EKG, in comparison to most recent prior from 10/25/2020 demonstrated atrial fibrillation with heart rate 94, right bundle branch block, T wave inversion in V1/V2 which is unchanged from most recent prior EKG, no evidence of ST changes, including no evidence  of ST elevation.  Chest x-ray, in comparison to CXR from August 2021 showed interval development of mild reticular and  subtle hazy airspace opacities at the right lung base, suspected to represent pneumonia versus atelectasis, in the absence of any evidence of edema, pleural effusion, or pneumothorax.  While in the ED, the following were administered: Decadron 10 mg IV x1, remdesivir.  Subsequently, the patient is admitted to the PCU at [facility] for further evaluation and management of severe [id number] infection complicated by acute hypoxic respiratory failure.     Review of Systems: As per HPI otherwise 10 point review of systems negative.   Past Medical History:  Diagnosis Date   Anemia    - Hgb 9.7gm% on 07/13/2008 in [place] -  Hgg 129gm% wiht normal irone levsl and ferritin 10/27/2008 in [place] Recurrent otitis/sinusitis   Anxiety    chronic BZ prn   Atrial fibrillation (HCC)    chronic anticoag   Bronchiectasis    >PFT 07/13/2008 in [place] 1.9L/76%, FVC 2.45L/74, Ratio 79, TLC 121%, DLCO 64%  AE BRonchiectasis - Dec 2010.New Rx:  outpatient - Feb 2011 - Rx outpatient   CHF (congestive heart failure) ([place])    COPD (chronic obstructive pulmonary disease) (HCC)    bronchiectasis   Cricopharyngeal achalasia    Depression    Diverticulosis    Dyslipidemia    Eczema    GERD with stricture    Glaucoma    H. 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[place])    Vertigo    Wears glasses     Past Surgical History:  Procedure Laterality Date   BOWEL RESECTION N/A 02/08/2020   Procedure: SMALL BOWEL RESECTION;  Surgeon: Donnie Mesa, MD;  Location: [place];  Service: General;  Laterality: N/A;   BREAST SURGERY     br bx   CARDIAC CATHETERIZATION  07/01/2013   CATARACT EXTRACTION     both   COLONOSCOPY     ESOPHAGOSCOPY W/ BOTOX INJECTION  12/11/2011   Procedure: ESOPHAGOSCOPY WITH BOTOX INJECTION;  Surgeon: Rozetta Nunnery, MD;   Location: [place];  Service: ENT;  Laterality: N/A;  esophogoscopy with dilation, botox injection   FOOT SURGERY  03/14/2011   gastroc slide-rt   GASTROSTOMY N/A 02/08/2020   Procedure: INSERTION OF GASTROSTOMY TUBE;  Surgeon: Donnie Mesa, MD;  Location: [place];  Service: General;  Laterality: N/A;   HIP ARTHROPLASTY Right 10/28/2019   Procedure: ARTHROPLASTY BIPOLAR HIP (HEMIARTHROPLASTY);  Surgeon: Marybelle Killings, MD;  Location: [facility];  Service: Orthopedics;  Laterality: Right;   LAPAROTOMY N/A 02/08/2020   Procedure: EXPLORATORY LAPAROTOMY;  Surgeon: Donnie Mesa, MD;  Location: [place];  Service: General;  Laterality: N/A;   LYSIS OF ADHESION N/A 02/08/2020   Procedure: LYSIS OF ADHESIONS;  Surgeon: Donnie Mesa, MD;  Location: [place];  Service: General;  Laterality: N/A;   TOTAL ABDOMINAL HYSTERECTOMY     TOTAL HIP REVISION Right 11/16/2019   Procedure: TOTAL HIP REVISION BIPOLAR TO CEMENTED BIPOLAR;  Surgeon: Marybelle Killings, MD;  Location: [place];  Service: Orthopedics;  Laterality: Right;   WISDOM TOOTH EXTRACTION      Social History:  reports that she has never smoked. She has never used smokeless tobacco.  She reports that she does not drink alcohol and does not use drugs.   Allergies  Allergen Reactions   Doxycycline Other (See Comments)    Rash on face and neck   Hydrocodone     Other reaction(s): hallucinations   Vicodin [Hydrocodone-Acetaminophen]     Hallucinations    Flagyl [Metronidazole] Nausea And Vomiting   Moxifloxacin Other (See Comments)     Weakness/fatigue   Pantoprazole Sodium Rash   Penicillins Rash    Has patient had a PCN reaction causing immediate rash, facial/tongue/throat swelling, SOB or lightheadedness with hypotension:unsure Has patient had a PCN reaction causing severe rash involving mucus membranes or skin necrosis:unsure Has patient had a PCN reaction that required hospitalization:No Has patient had a PCN reaction occurring within the  last 10 years:Yes Cannot recall exact reaction due to time lapse If all of the above answers are "NO", then may proceed with Cephalosporin use.  Other reaction(s): Unknown    Family History  Problem Relation Age of Onset   Hypertension Mother    Stroke Mother    Emphysema Brother    Other Father        miner's lung   Cancer Father        Lung   Colon polyps Sister    Pancreatic cancer Sister    Kidney disease Sister    Atrial fibrillation Other        siblings    Family history reviewed and not pertinent    Prior to Admission medications   Medication Sig Start Date End Date Taking? Authorizing Provider  acetaminophen (TYLENOL) 325 MG tablet Take 2 tablets (650 mg total) by mouth every 6 (six) hours as needed for mild pain (or Fever >/= 101). 03/01/20   Angiulli, Lavon Paganini, PA-C  apixaban (ELIQUIS) 2.5 MG TABS tablet Take 1 tablet (2.5 mg total) by mouth 2 (two) times daily. 10/04/20   Allred, Jeneen Rinks, MD  FLUoxetine (PROZAC) 20 MG capsule Take 1 capsule by mouth daily. 08/22/20   [provider]  furosemide (LASIX) 20 MG tablet Take 20 mg by mouth daily. 03/16/20   [provider]  hydrOXYzine (ATARAX/VISTARIL) 25 MG tablet Take 12.5-25 mg by mouth every 8 (eight) hours as needed. 10/07/20   [provider]  levothyroxine (SYNTHROID) 50 MCG tablet Take 1 tablet (50 mcg total) by mouth daily. 03/01/20   Angiulli, Lavon Paganini, PA-C  omeprazole (PRILOSEC) 40 MG capsule Take 1 capsule (40 mg total) by mouth 2 (two) times daily before a meal. 03/21/21   Pyrtle, Lajuan Lines, MD  potassium chloride (KLOR-CON) 8 MEQ tablet Take 16 mEq by mouth daily. 11/28/20   [provider]  pravastatin (PRAVACHOL) 40 MG tablet Take 1 tablet (40 mg total) by mouth at bedtime. 03/01/20   Angiulli, Lavon Paganini, PA-C  psyllium (METAMUCIL) 58.6 % packet Take 1 packet by mouth daily.    [provider]  SYMBICORT 80-4.5 MCG/ACT inhaler INHALE 2 PUFFS INTO THE LUNGS TWICE DAILY 05/05/21    Brand Males, MD     Objective    Physical Exam: Vitals:   06/17/21 1524 06/17/21 1531 06/17/21 1534 06/17/21 1603  BP:  (!) 152/85    Pulse:  97    Resp:  16    Temp:  97.9 F (36.6 C)    TempSrc:  Oral    SpO2:  (!) 89% 92%   Weight:    72.6 kg  Height: _0  (1.549 m)  General: appears to be stated age; alert, oriented; mildly increased work of breathing noted Skin: warm, dry, no rash Head:  AT/[place] Mouth:  Oral mucosa membranes appear moist, normal dentition Neck: supple; trachea midline Heart: Irregular; did not appreciate any M/R/G Lungs: Mild bilateral expiratory wheezes, but otherwise CTAB, did not appreciate any, rales, or rhonchi Abdomen: + BS; soft, ND, NT Vascular: 2+ pedal pulses b/l; 2+ radial pulses b/l Extremities: no peripheral edema, no muscle wasting Neuro: strength and sensation intact in upper and lower extremities b/l   Labs on Admission: I have personally reviewed following labs and imaging studies  CBC: Recent Labs  Lab 06/17/21 1603  WBC 3.5*  HGB 11.0*  HCT 34.9*  MCV 81.7  PLT 161   Basic Metabolic Panel: Recent Labs  Lab 06/17/21 1603  NA 124*  K 3.9  CL 90*  CO2 26  GLUCOSE 129*  BUN 9  CREATININE 0.50  CALCIUM 8.2*   GFR: Estimated Creatinine Clearance: 46.8 mL/min (by C-G formula based on SCr of 0.5 mg/dL). Liver Function Tests: No results for input(s): AST, ALT, ALKPHOS, BILITOT, PROT, ALBUMIN in the last 168 hours. No results for input(s): LIPASE, AMYLASE in the last 168 hours. No results for input(s): AMMONIA in the last 168 hours. Coagulation Profile: No results for input(s): INR, PROTIME in the last 168 hours. Cardiac Enzymes: No results for input(s): CKTOTAL, CKMB, CKMBINDEX, TROPONINI in the last 168 hours. BNP (last 3 results) No results for input(s): PROBNP in the last 8760 hours. HbA1C: No results for input(s): HGBA1C in the last 72 hours. CBG: No results for input(s): GLUCAP in the last 168  hours. Lipid Profile: No results for input(s): CHOL, HDL, LDLCALC, TRIG, CHOLHDL, LDLDIRECT in the last 72 hours. Thyroid Function Tests: No results for input(s): TSH, T4TOTAL, FREET4, T3FREE, THYROIDAB in the last 72 hours. Anemia Panel: No results for input(s): VITAMINB12, FOLATE, FERRITIN, TIBC, IRON, RETICCTPCT in the last 72 hours. Urine analysis:    Component Value Date/Time   COLORURINE YELLOW 06/17/2021 [place] 06/17/2021 1655   LABSPEC 1.010 06/17/2021 1655   PHURINE 7.0 06/17/2021 1655   GLUCOSEU NEGATIVE 06/17/2021 1655   HGBUR NEGATIVE 06/17/2021 [place] 06/17/2021 1655   BILIRUBINUR Neg 02/06/2012 1022   KETONESUR NEGATIVE 06/17/2021 1655   PROTEINUR 30 (A) 06/17/2021 1655   UROBILINOGEN 0.2 04/07/2012 1443   NITRITE NEGATIVE 06/17/2021 1655   LEUKOCYTESUR TRACE (A) 06/17/2021 1655    Radiological Exams on Admission: DG Chest Port 1 View  Result Date: 06/17/2021 CLINICAL DATA:  Positive COVID test.  Productive cough for 3 days. EXAM: PORTABLE CHEST 1 VIEW COMPARISON:  02/19/2020 and older studies. FINDINGS: Cardiac silhouette mildly enlarged.  No mediastinal or hilar masses. Lungs hyperexpanded. Coarse heterogeneous interstitial thickening with intervening ground-glass opacities, most evident in the upper lobes, similar to the prior exam. Subtle opacity at the right lung base has increased from the prior exam, which could reflect progressive fibrosis or acute infiltrate. No evidence of pulmonary edema. No convincing pleural effusion and no pneumothorax. Old proximal left humeral fracture, healed. IMPRESSION: 1. Mild reticular and subtle hazy airspace opacity at the right lung base new since the prior exam. This may reflect pneumonia or atelectasis or progression of pulmonary fibrosis. 2. Other changes of upper lobe predominant pulmonary fibrosis are stable. No other evidence of acute cardiopulmonary disease. Electronically Signed   By:  Lajean Manes M.D.   On: 06/17/2021 16:11     EKG:  Independently reviewed, with result as described above.    Assessment/Plan    Principal Problem:   COVID-19 virus infection Active Problems:   A-fib (HCC)   Hypothyroid   Hyponatremia   Chronic anticoagulation   Chronic obstructive pulmonary disease (HCC)   SOB (shortness of breath)   Acute respiratory failure with hypoxia (HCC)   Severe sepsis (HCC)   Chronic diastolic CHF (congestive heart failure) (HCC)   Chronic anemia      #) Severe COVID-19 infection: dx on the basis of 3 days of progressive shortness of breath, new onset productive cough, rhinitis, rhinorrhea, subjective fever, with positive home COVID-19 test on 06/16/2021 confirmed via COVID 19 PCR performed in the ED today. Additionally, in context of no known baseline supplemental O2 requirements on diurnal basis, the pt  is requiring 5 L nasal cannula in order to maintain O2 sats greater than or equal to 94%.  This is relative to known overnight supplemental oxygen requirement of 2 L nasal cannula . in setting of this acute hypoxia, criteria are met for patient's COVID-19 infxn to be considered severe in nature. Consequently, there is a Grade 2c rec for systemic corticosteroids, which is further supported by tx guidance recommendations from [facility] Covid Treatment Guidelines.  She has received a dose of Decadron in the ED this evening.  However, in the context of documentation history of COPD, will transition to solumedrol. CXR shows mild reticular and septal hazy airspace opacity at the right lung base, without overt evidence of any edema, pleural effusion, or pneumothorax.   Of note, in the setting of the pt's age greater than 39 as well as multiple co-morbidities that include copd, this pt meets criteria to be considered high risk for a more complicated clinical course of COVID-19 infxn, including increased probability for progression of the severity associated with  this infxn. Therefore, in setting of symptomatic COVID-19 infxn requiring hospitalization for further evaluation and management thereof in this pt with the aforementioned high risk criteria who is felt to be early in the course of their infxn given onset of respiratory symptoms starting less than 5 days ago, indications are met for initiation of remdesivir per tx guidance recommendations from [facility]'s Covid Treatment Guidelines.   Will closely monitor ensuing degree of hypoxia as well as associated trend in supplemental O2 requirements. Will also trend inflammatory markers, as further noted below. Does not appear to be indication for initiation of Tocilizumab at this time, as further described below. No h/o diabetes.   Will add-on procalcitonin, which , if non-elevated in the context of the pro-inflammatory state a/w COVID-19, would provide a high degree of negative predictive value against the likelihood of any contribution from bacterial pneumonia.  Timing.  Duration and sequence of her respiratory symptoms are less suggestive of secondary bacterial pneumonia.  Influenza negative.     Plan: Airborne and contact precautions. Monitor continuous pulse ox and monitor on telemetry. prn supplemental O2 to maintain O2 sats greater than or equal to 94%. Proning protocol initiated. PRN albuterol inhaler. Scheduled combivent inhaler q6H. PRN acetaminophen for fever. Start solumedrol and remdesivir, as above. Check and trend inflammatory markers. Check serum mag and phos levels. Check CMP/CBC in the AM. Flutter valve and incentive spirometry.  Add on procalcitonin level, as above.  Check blood gas.        #) Acute hypoxic respiratory failure: in the context of no known baseline supplemental oxygen requirements during the day, with supplemental O2 requirement limited to 2  L nasal cannula at night, presenting O2 sat while awake during the day today noted to be 85 to 87% on room air, subsequently improving  to 100% on 5 L nasal cannula. Appears to be on the basis of COVID-19 infection, as above, with potential secondary exacerbation of documented history of COPD.   In considering other potential contributory etiologies, ACS is felt to be less likely at this time in the absence of any recent chest pain and in the context of an elevated troponin and presenting EKG showing no evidence of acute ischemic process. No clinical evidence of suggest acutely decompensated heart failure at this time, but will check BNP to further assess.  Acute PE is felt to be less likely given the patient's good compliance with chronic anticoagulation on home Eliquis.  Presentation less suggestive of secondary bacterial pneumonia, as above, but will assess procalcitonin to further evaluate.  Influenza A/B PCR negative today.     Plan: further evaluation and management of presenting COVID-19 infection, as above, including solumedrol and remdesivir .  Monitor continuous pulse oximetry with prn supplemental O2 to maintain O2 sats greater than or equal to 94%. monitor on telemetry. Trending of inflammatory markers, as above. Check CMP/CBC in the morning. Check serum Mg and Phos levels. Check blood gas, particularly in the setting of a history of COPD. Flutter valve and incentive spirometry. PRN albuterol inhaler. Scheduled combivent inhaler q6H. add on BNP.  Check procalcitonin..  Trend troponin.       #) Severe Sepsis: Appears to be on the basis of COVID-19, as above, with SIRS criteria met via presenting leukopenia, tachycardia, and tachypnea.  Patient's sepsis meets criteria to be consider severe in nature on the basis of concomitant acute hypoxic respiratory failure. No evidence of associated hypotension thus far. Will check STAT LA.   At the present time, in the absence of LA level greater than or equal to 4.0 and in the absence of hypotension, criteria are not met at this time for initiation of a 30 mL/kg IVF bolus. No evidence  of additional underlying infectious process beyond COVID-19, and bacterial pneumonia is felt to be less likely.  As patient's sepsis is felt to be viral in nature, will refrain from antibiotic coverage at this time.  Of note, UA inconsistent with UTI.    Plan: Work-up and management of COVID-19 infection, as above. Check blood cultures x2.  Repeat CBC with differential in the morning.  PRN acetaminophen for fever.  Check lactic acid.  Check procalcitonin, as above. Will refrain from antibiotics for now per above rationale.         #) Acute on chronic hypo-osmolar hyponatremia: In setting of a documented history of chronic hyponatremia associated with baseline serum sodium range of 1 31-1 34, with most recent prior sodium noted to be 131 in September 2022, presenting serum sodium noted to be 124.  Patient appears relatively euvolemic at this time, and current differential appears to favor SIADH as a consequence of presenting severe COVID-19 infection.  Consequently, will refrain from aggressive IV fluids at this time, further evaluating for SIADH, as noted below.  While she has a history of chronic heart failure with preserved EF, presentation most suggestive of acute volume.  She also has a history of acquired hypothyroidism on Synthroid .potential pharmacologic contributions include outpatient Prozac use. Of note, no evidence of associated acute focal neurologic deficits and no report of recent trauma.     Plan: monitor strict I's and O's and daily  weights. random urine sodium, urine osmolality.  Check serum osmolality to confirm suspected hypoosmolar etiology. Will repeat BMP now. CMP in repeat BMP in the morning. Check TSH.  Check serum uric acid level, as SIADH can be associated with hypouricemia due to hyperuricuria.  Hold home Prozac for now.  Add on BNP.          #) Chronic atrial fibrillation: Documented history of such. In setting of CHA2DS2-VASc score of 6, there is an indication  for chronic anticoagulation for thromboembolic prophylaxis. Consistent with this, the patient is chronically anticoagulated on Eliquis. Home AV nodal blocking regimen: None.  Most recent echocardiogram occurred in April 2021, with notable findings include LVEF 66 5%, inability to assess diastolic parameters in setting of atrial fibrillation, severely dilated right atrium, mild MR, mild to moderate TR. Presenting EKG demonstrates rate controlled atrial fibrillation without overt evidence of acute ischemic changes..    Plan: monitor strict I's & O's and daily weights. Repeat BMP/CBC in AM. Check serum mag level.  Continue home Eliquis, with next dose now.  Continue home AV nodal blocking regimen.  Monitor on telemetry.  Add on BNP.        #) Depression: Documented history of such, on Prozac as outpatient.  In the setting of acute on chronic hyponatremia, will hold home Prozac for now pending additional evaluation and trending of serum sodium level.  Plan: Hold home Prozac for now, as above.       #) COPD: Per chart review, documentation of a history of COPD, the patient conveys that she has a lifelong non-smoker.  Outpatient respiratory regimen consist of scheduled Symbicort.  It appears that her primary pulmonary pathology leading to this hospitalization is severe COVID-19 infection, she is at risk for ensuing development of acute COPD exacerbation as a consequence of this.   Plan: Further evaluation management of presenting severe COVID-19 infection.  Solumedrol.  Scheduled Combivent inhaler.  Prn albuterol inhaler.  Check blood gas.  Monitor continuous pulse oximetry.  Monitor telemetry.  Add on serum magnesium level and check serum phosphorus level.  Flutter valve, incentive spirometry.       #) Chronic heart failure with preserved EF: Per most recent echocardiogram in April 2021, it appears that she has a history of chronic right-sided systolic heart failure, for which she is on Lasix  20 mg p.o. daily.  This echo was also associated with LVEF 60 to 65% and inability to assess diastolic parameters in the context of atrial fibrillation with.  Patient appears relatively euvolemic at this time.  We will plan to resume home Lasix tomorrow morning,while closely monitoring interval volume status.   Plan: Add on BNP.  Monitor strict I's and O's and daily weights.  Resume home Lasix 20 mg p.o. daily tomorrow morning.  BMP in the morning.  Add on serum magnesium level.         #) Hyperlipidemia: documented h/o such. On pravastatin as outpatient.    Plan: continue home statin.          #) acquired hypothyroidism: documented h/o such, on Synthroid as outpatient.  In the setting of presenting with acute on chronic hyponatremia, will check TSH, as above.   Plan: Check TSH.  In the meantime, will cont home Synthroid.         #) Chronic anemia: Patient with a documented history of anemia of chronic disease, associated with baseline hemoglobin 9-11, with presenting hemoglobin consistent with this range.  No evidence of active bleed  at this time.  Plan: Repeat CBC in the morning.      DVT prophylaxis: Continue home Eliquis Code Status: Full code: Per my discussions with the patient this evening, she wishes to remain full code for now, and she plans to discuss CODE STATUS further with her daughter (POA; [name]).  Family Communication: none Disposition Plan: Per Rounding Team Consults called: none;  Admission status: Inpatient; PCU  Warrants inpatient status on basis of need for further evaluation/management of severe COVID-19 infection associated with acute hypoxic respiratory failure, warranting IV infusion of antiviral medication as well as close monitoring of ensuing supplemental oxygen requirements as well as monitoring on telemetry, as well as expedited evaluation for any additional factors contributing to her presenting acute hypoxic respiratory failure via  expanded laboratory evaluation as outlined above.   PLEASE NOTE THAT DRAGON DICTATION SOFTWARE WAS USED IN THE CONSTRUCTION OF THIS NOTE.   Kenefic DO [facility]   06/17/2021, 11:24 PM

## 2021-06-17 NOTE — ED Notes (Signed)
Patient o2 sat 89% room air. 2 L o2 [place] applied

## 2021-06-17 NOTE — ED Provider Notes (Signed)
[facility] DEPT Provider Note   CSN: [id number] Arrival date & time: 06/17/21  1514     History Chief Complaint  Patient presents with   Weakness    Stephanie Cortez is a 87 y.o. female.  The history is provided by the patient and medical records.  Weakness  87 year old female with history of anemia, anxiety, A. fib on Eliquis, CHF, COPD, GERD, IBS, presenting to the ED with cough and shortness of breath.  States he has felt this way for about 3 days.  She had a home test that was positive for COVID-19.  Cough is mostly been productive with clear mucus, occasionally green/yellow.  She denies any hemoptysis.  She has had decreased appetite and some generalized weakness, a lot of difficulty getting around with her walker lately.  Patient has not had any COVID-19 vaccinations or boosters.  Past Medical History:  Diagnosis Date   Anemia    - Hgb 9.7gm% on 07/13/2008 in [place] -  Hgg 129gm% wiht normal irone levsl and ferritin 10/27/2008 in [place] Recurrent otitis/sinusitis   Anxiety    chronic BZ prn   Atrial fibrillation (HCC)    chronic anticoag   Bronchiectasis    >PFT 07/13/2008 in [place] 1.9L/76%, FVC 2.45L/74, Ratio 79, TLC 121%, DLCO 64%  AE BRonchiectasis - Dec 2010.New Rx:  outpatient - Feb 2011 - Rx outpatient   CHF (congestive heart failure) (HCC)    COPD (chronic obstructive pulmonary disease) (HCC)    bronchiectasis   Cricopharyngeal achalasia    Depression    Diverticulosis    Dyslipidemia    Eczema    GERD with stricture    Glaucoma    H. 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[place])    Vertigo    Wears glasses     Patient Active Problem List   Diagnosis Date Noted   Chronic pain of right knee 06/20/2020   Abnormality of gait 06/20/2020   Malnutrition of  moderate degree 02/25/2020   S/P percutaneous endoscopic gastrostomy (PEG) tube placement (HCC)    Post-operative pain    Subtherapeutic international normalized ratio (INR)    Debility 02/17/2020   Mesenteric ischemia (HCC)    Sepsis ([place]) 02/08/2020   Small bowel ischemia ([place]) 02/08/2020   Thrombocytosis 02/08/2020   Abnormal CT of the abdomen    Periprosthetic fracture around internal prosthetic hip joint 11/16/2019   Periprosthetic fracture around internal prosthetic hip joint, initial encounter    Chronic obstructive pulmonary disease (HCC)    Essential hypertension    Chronic anticoagulation    Acute blood loss anemia    Transaminitis    Hypoalbuminemia due to protein-calorie malnutrition ([place])    Labile blood pressure    Drug induced constipation    Postoperative pain    Subcapital fracture of femur ([place]) 10/30/2019   Post-operative state    Prerenal azotemia    Leukocytosis    Closed right hip fracture, initial encounter ([place]) 10/27/2019   Hoarseness 02/19/2018   Sensorineural hearing loss (SNHL) of both ears 02/19/2018   Tinnitus, bilateral 02/19/2018   Cough 09/06/2017   Cough variant asthma 08/19/2017   Acute bronchitis 02/20/2016   Pulmonary air trapping 01/04/2016   Encounter for preoperative pulmonary examination 01/04/2016  NSIP (nonspecific interstitial pneumonia) ([place]) 09/26/2015   Chronic sinusitis 09/26/2015   ILD (interstitial lung disease) ([place]) 07/12/2014   Upper airway cough syndrome 09/23/2013   Encounter for therapeutic drug monitoring 08/11/2013   Congestive heart failure ([place]) 07/08/2013   Coronary artery calcification seen on CAT scan 03/15/2013   Nodule of left lung 03/15/2013   Orthostatic lightheadedness 02/09/2013   Depression    Hyponatremia 04/03/2012   Diverticulitis 01/01/2012   GERD (gastroesophageal reflux disease) 01/01/2012   Hypothyroid 11/23/2010   Edema 10/04/2010   Bronchiectasis ([place])    HYPERTENSION, BENIGN 10/22/2008    A-fib ([place])     Past Surgical History:  Procedure Laterality Date   BOWEL RESECTION N/A 02/08/2020   Procedure: SMALL BOWEL RESECTION;  Surgeon: Donnie Mesa, MD;  Location: [facility];  Service: General;  Laterality: N/A;   BREAST SURGERY     br bx   CARDIAC CATHETERIZATION  07/01/2013   CATARACT EXTRACTION     both   COLONOSCOPY     ESOPHAGOSCOPY W/ BOTOX INJECTION  12/11/2011   Procedure: ESOPHAGOSCOPY WITH BOTOX INJECTION;  Surgeon: Rozetta Nunnery, MD;  Location: [facility];  Service: ENT;  Laterality: N/A;  esophogoscopy with dilation, botox injection   FOOT SURGERY  03/14/2011   gastroc slide-rt   GASTROSTOMY N/A 02/08/2020   Procedure: INSERTION OF GASTROSTOMY TUBE;  Surgeon: Donnie Mesa, MD;  Location: [facility];  Service: General;  Laterality: N/A;   HIP ARTHROPLASTY Right 10/28/2019   Procedure: ARTHROPLASTY BIPOLAR HIP (HEMIARTHROPLASTY);  Surgeon: Marybelle Killings, MD;  Location: [facility];  Service: Orthopedics;  Laterality: Right;   LAPAROTOMY N/A 02/08/2020   Procedure: EXPLORATORY LAPAROTOMY;  Surgeon: Donnie Mesa, MD;  Location: [facility];  Service: General;  Laterality: N/A;   LYSIS OF ADHESION N/A 02/08/2020   Procedure: LYSIS OF ADHESIONS;  Surgeon: Donnie Mesa, MD;  Location: [facility];  Service: General;  Laterality: N/A;   TOTAL ABDOMINAL HYSTERECTOMY     TOTAL HIP REVISION Right 11/16/2019   Procedure: TOTAL HIP REVISION BIPOLAR TO CEMENTED BIPOLAR;  Surgeon: Marybelle Killings, MD;  Location: [facility];  Service: Orthopedics;  Laterality: Right;   WISDOM TOOTH EXTRACTION       OB History   No obstetric history on file.     Family History  Problem Relation Age of Onset   Hypertension Mother    Stroke Mother    Emphysema Brother    Other Father        miner's lung   Cancer Father        Lung   Colon polyps Sister    Pancreatic cancer Sister    Kidney disease Sister    Atrial fibrillation Other        siblings    Social History   Tobacco Use    Smoking status: Never   Smokeless tobacco: Never  Vaping Use   Vaping Use: Never used  Substance Use Topics   Alcohol use: No   Drug use: No    Home Medications Prior to Admission medications   Medication Sig Start Date End Date Taking? Authorizing Provider  acetaminophen (TYLENOL) 325 MG tablet Take 2 tablets (650 mg total) by mouth every 6 (six) hours as needed for mild pain (or Fever >/= 101). 03/01/20   Angiulli, Lavon Paganini, PA-C  apixaban (ELIQUIS) 2.5 MG TABS tablet Take 1 tablet (2.5 mg total) by mouth 2 (two) times daily. 10/04/20   Thompson Grayer, MD  FLUoxetine (PROZAC) 20  MG capsule Take 1 capsule by mouth daily. 08/22/20   [provider]  furosemide (LASIX) 20 MG tablet Take 20 mg by mouth daily. 03/16/20   [provider]  hydrOXYzine (ATARAX/VISTARIL) 25 MG tablet Take 12.5-25 mg by mouth every 8 (eight) hours as needed. 10/07/20   [provider]  levothyroxine (SYNTHROID) 50 MCG tablet Take 1 tablet (50 mcg total) by mouth daily. 03/01/20   Angiulli, Lavon Paganini, PA-C  omeprazole (PRILOSEC) 40 MG capsule Take 1 capsule (40 mg total) by mouth 2 (two) times daily before a meal. 03/21/21   Pyrtle, Lajuan Lines, MD  potassium chloride (KLOR-CON) 8 MEQ tablet Take 16 mEq by mouth daily. 11/28/20   [provider]  pravastatin (PRAVACHOL) 40 MG tablet Take 1 tablet (40 mg total) by mouth at bedtime. 03/01/20   Angiulli, Lavon Paganini, PA-C  psyllium (METAMUCIL) 58.6 % packet Take 1 packet by mouth daily.    [provider]  SYMBICORT 80-4.5 MCG/ACT inhaler INHALE 2 PUFFS INTO THE LUNGS TWICE DAILY 05/05/21   Brand Males, MD    Allergies    Doxycycline, Hydrocodone, Vicodin [hydrocodone-acetaminophen], Flagyl [metronidazole], Moxifloxacin, Pantoprazole sodium, and Penicillins  Review of Systems   Review of Systems  Neurological:  Positive for weakness.  All other systems reviewed and are negative.  Physical Exam Updated Vital Signs BP (!) 152/85     Pulse 97    Temp 97.9 F (36.6 C) (Oral)    Resp 16    Ht 5\' 1"  (1.549 m)    Wt 72.6 kg    LMP  (LMP Unknown)    SpO2 92%    BMI 30.23 kg/m   Physical Exam Vitals and nursing note reviewed.  Constitutional:      Appearance: She is well-developed.     Comments: Elderly, appears weak  HENT:     Head: Normocephalic and atraumatic.  Eyes:     Conjunctiva/sclera: Conjunctivae normal.     Pupils: Pupils are equal, round, and reactive to light.  Cardiovascular:     Rate and Rhythm: Normal rate and regular rhythm.     Heart sounds: Normal heart sounds.  Pulmonary:     Effort: Pulmonary effort is normal. No respiratory distress.     Breath sounds: Rhonchi present.     Comments: Scattered rhonchi, currently on 5 L nasal cannula and maintaining mid 90's Abdominal:     General: Bowel sounds are normal.     Palpations: Abdomen is soft.     Tenderness: There is no abdominal tenderness. There is no rebound.  Musculoskeletal:        General: Normal range of motion.     Cervical back: Normal range of motion.  Skin:    General: Skin is warm and dry.  Neurological:     Mental Status: She is alert and oriented to person, place, and time.    ED Results / Procedures / Treatments   Labs (all labs ordered are listed, but only abnormal results are displayed) Labs Reviewed  RESP PANEL BY RT-PCR (FLU A&B, COVID) ARPGX2 - Abnormal; Notable for the following components:      Result Value   SARS Coronavirus 2 by RT PCR POSITIVE (*)    All other components within normal limits  BASIC METABOLIC PANEL - Abnormal; Notable for the following components:   Sodium 124 (*)    Chloride 90 (*)    Glucose, Bld 129 (*)    Calcium 8.2 (*)    All other  components within normal limits  CBC - Abnormal; Notable for the following components:   WBC 3.5 (*)    Hemoglobin 11.0 (*)    HCT 34.9 (*)    MCH 25.8 (*)    RDW 17.1 (*)    All other components within normal limits  URINALYSIS, ROUTINE W REFLEX MICROSCOPIC  - Abnormal; Notable for the following components:   Protein, ur 30 (*)    Leukocytes,Ua TRACE (*)    All other components within normal limits  TROPONIN I (HIGH SENSITIVITY) - Abnormal; Notable for the following components:   Troponin I (High Sensitivity) 18 (*)    All other components within normal limits  TROPONIN I (HIGH SENSITIVITY)    EKG EKG Interpretation  Date/Time:  Saturday June 17 2021 16:25:32 EST Ventricular Rate:  94 PR Interval:    QRS Duration: 147 QT Interval:  405 QTC Calculation: 507 R Axis:   87 Text Interpretation: Atrial fibrillation Right bundle branch block Baseline wander in lead(s) II III aVF Poor data quality in current ECG precludes serial comparison Confirmed by Sherwood Gambler [phone]) on 06/17/2021 4:43:52 PM  Radiology DG Chest Port 1 View  Result Date: 06/17/2021 CLINICAL DATA:  Positive COVID test.  Productive cough for 3 days. EXAM: PORTABLE CHEST 1 VIEW COMPARISON:  02/19/2020 and older studies. FINDINGS: Cardiac silhouette mildly enlarged.  No mediastinal or hilar masses. Lungs hyperexpanded. Coarse heterogeneous interstitial thickening with intervening ground-glass opacities, most evident in the upper lobes, similar to the prior exam. Subtle opacity at the right lung base has increased from the prior exam, which could reflect progressive fibrosis or acute infiltrate. No evidence of pulmonary edema. No convincing pleural effusion and no pneumothorax. Old proximal left humeral fracture, healed. IMPRESSION: 1. Mild reticular and subtle hazy airspace opacity at the right lung base new since the prior exam. This may reflect pneumonia or atelectasis or progression of pulmonary fibrosis. 2. Other changes of upper lobe predominant pulmonary fibrosis are stable. No other evidence of acute cardiopulmonary disease. Electronically Signed   By: Lajean Manes M.D.   On: 06/17/2021 16:11    Procedures Procedures   CRITICAL CARE Performed by: Larene Pickett   Total critical care time: 45 minutes  Critical care time was exclusive of separately billable procedures and treating other patients.  Critical care was necessary to treat or prevent imminent or life-threatening deterioration.  Critical care was time spent personally by me on the following activities: development of treatment plan with patient and/or surrogate as well as nursing, discussions with consultants, evaluation of patient's response to treatment, examination of patient, obtaining history from patient or surrogate, ordering and performing treatments and interventions, ordering and review of laboratory studies, ordering and review of radiographic studies, pulse oximetry and re-evaluation of patient's condition.   Medications Ordered in ED Medications - No data to display  ED Course  I have reviewed the triage vital signs and the nursing notes.  Pertinent labs & imaging results that were available during my care of the patient were reviewed by me and considered in my medical decision making (see chart for details).    MDM Rules/Calculators/A&P  87 year old female presenting to the ED with shortness of breath.  Has been feeling unwell for about 3 days now, home test was positive for COVID yesterday, this is confirmed here.  Has had productive cough.  She is afebrile and nontoxic but does appear weak on exam.  She is currently requiring 5 L nasal cannula to maintain saturations  in the mid 90s.  Does have scattered rhonchi on exam.  Labs as above--overall reassuring.  COVID screen is positive.  Chest x-ray with changes of COVID-pneumonia.  She is chronically anticoagulated with Eliquis so I feel risk of PE is low.  She is agreeable to Decadron and remdesivir which has been ordered.  As she is requiring supplemental oxygen, she will require admission.  Discussed with hospitalist, Dr. Velia Meyer-- will admit for ongoing care.  Final Clinical Impression(s) / ED Diagnoses Final  diagnoses:  COVID-19  Acute respiratory failure with hypoxia [facility])    Rx / DC Orders ED Discharge Orders     None        Larene Pickett, PA-C 06/18/21 Aviva Signs, MD 06/20/21 [phone]

## 2021-06-17 NOTE — ED Provider Notes (Signed)
Emergency Medicine Provider Triage Evaluation Note  Stephanie Cortez , a 87 y.o. female  was evaluated in triage.  Pt complains of sob, cough for several days. Tested positive for covid.  Review of Systems  Positive: Cough, sob, generalized weakness Negative: Chest pain  Physical Exam  BP (!) 152/85    Pulse 97    Temp 97.9 F (36.6 C) (Oral)    Resp 16    Ht 5\' 1"  (1.549 m)    LMP  (LMP Unknown)    SpO2 (!) 89%    BMI 30.80 kg/m  Gen:   Awake, no distress   Resp:  Normal effort  MSK:   Moves extremities without difficulty  Other:  tachypneic  Medical Decision Making  Medically screening exam initiated at 3:34 PM.  Appropriate orders placed.  TRINITI GRUETZMACHER was informed that the remainder of the evaluation will be completed by another provider, this initial triage assessment does not replace that evaluation, and the importance of remaining in the ED until their evaluation is complete.     Bishop Dublin 06/17/21 1534    Carmin Muskrat, MD 06/17/21 2226

## 2021-06-18 DIAGNOSIS — D649 Anemia, unspecified: Secondary | ICD-10-CM | POA: Diagnosis present

## 2021-06-18 DIAGNOSIS — R6520 Severe sepsis without septic shock: Secondary | ICD-10-CM | POA: Diagnosis present

## 2021-06-18 DIAGNOSIS — J9621 Acute and chronic respiratory failure with hypoxia: Secondary | ICD-10-CM | POA: Diagnosis present

## 2021-06-18 DIAGNOSIS — R0602 Shortness of breath: Secondary | ICD-10-CM | POA: Insufficient documentation

## 2021-06-18 DIAGNOSIS — J9601 Acute respiratory failure with hypoxia: Secondary | ICD-10-CM | POA: Diagnosis present

## 2021-06-18 DIAGNOSIS — I5032 Chronic diastolic (congestive) heart failure: Secondary | ICD-10-CM | POA: Diagnosis present

## 2021-06-18 LAB — BLOOD GAS, VENOUS
Acid-Base Excess: 2.1 mmol/L — ABNORMAL HIGH (ref 0.0–2.0)
Bicarbonate: 27.6 mmol/L (ref 20.0–28.0)
O2 Saturation: 90.1 %
Patient temperature: 98.6
pCO2, Ven: 50.3 mmHg (ref 44.0–60.0)
pH, Ven: 7.359 (ref 7.250–7.430)
pO2, Ven: 65.3 mmHg — ABNORMAL HIGH (ref 32.0–45.0)

## 2021-06-18 LAB — COMPREHENSIVE METABOLIC PANEL
ALT: 17 U/L (ref 0–44)
AST: 53 U/L — ABNORMAL HIGH (ref 15–41)
Albumin: 2.7 g/dL — ABNORMAL LOW (ref 3.5–5.0)
Alkaline Phosphatase: 116 U/L (ref 38–126)
Anion gap: 6 (ref 5–15)
BUN: 7 mg/dL — ABNORMAL LOW (ref 8–23)
CO2: 27 mmol/L (ref 22–32)
Calcium: 8 mg/dL — ABNORMAL LOW (ref 8.9–10.3)
Chloride: 93 mmol/L — ABNORMAL LOW (ref 98–111)
Creatinine, Ser: 0.54 mg/dL (ref 0.44–1.00)
GFR, Estimated: >60 mL/min (ref >60)
Glucose, Bld: 125 mg/dL — ABNORMAL HIGH (ref 70–99)
Potassium: 4.4 mmol/L (ref 3.5–5.1)
Sodium: 126 mmol/L — ABNORMAL LOW (ref 135–145)
Total Bilirubin: 1.4 mg/dL — ABNORMAL HIGH (ref 0.3–1.2)
Total Protein: 6.7 g/dL (ref 6.5–8.1)

## 2021-06-18 LAB — CBC WITH DIFFERENTIAL/PLATELET
Abs Immature Granulocytes: 0.02 10*3/uL (ref 0.00–0.07)
Basophils Absolute: 0 10*3/uL (ref 0.0–0.1)
Basophils Relative: 0 %
Eosinophils Absolute: 0 10*3/uL (ref 0.0–0.5)
Eosinophils Relative: 0 %
HCT: 33.8 % — ABNORMAL LOW (ref 36.0–46.0)
Hemoglobin: 10.5 g/dL — ABNORMAL LOW (ref 12.0–15.0)
Immature Granulocytes: 1 %
Lymphocytes Relative: 10 %
Lymphs Abs: 0.3 10*3/uL — ABNORMAL LOW (ref 0.7–4.0)
MCH: 25.7 pg — ABNORMAL LOW (ref 26.0–34.0)
MCHC: 31.1 g/dL (ref 30.0–36.0)
MCV: 82.8 fL (ref 80.0–100.0)
Monocytes Absolute: 0.2 10*3/uL (ref 0.1–1.0)
Monocytes Relative: 6 %
Neutro Abs: 2.8 10*3/uL (ref 1.7–7.7)
Neutrophils Relative %: 83 %
Platelets: 230 10*3/uL (ref 150–400)
RBC: 4.08 MIL/uL (ref 3.87–5.11)
RDW: 17.2 % — ABNORMAL HIGH (ref 11.5–15.5)
WBC: 3.4 10*3/uL — ABNORMAL LOW (ref 4.0–10.5)
nRBC: 0 % (ref 0.0–0.2)

## 2021-06-18 LAB — BASIC METABOLIC PANEL
Anion gap: 9 (ref 5–15)
BUN: 8 mg/dL (ref 8–23)
CO2: 26 mmol/L (ref 22–32)
Calcium: 8 mg/dL — ABNORMAL LOW (ref 8.9–10.3)
Chloride: 93 mmol/L — ABNORMAL LOW (ref 98–111)
Creatinine, Ser: 0.61 mg/dL (ref 0.44–1.00)
GFR, Estimated: >60 mL/min (ref >60)
Glucose, Bld: 85 mg/dL (ref 70–99)
Potassium: 3.6 mmol/L (ref 3.5–5.1)
Sodium: 128 mmol/L — ABNORMAL LOW (ref 135–145)

## 2021-06-18 LAB — TSH: TSH: 2.343 u[IU]/mL (ref 0.350–4.500)

## 2021-06-18 LAB — OSMOLALITY, URINE: Osmolality, Ur: 379 mOsm/kg (ref 300–900)

## 2021-06-18 LAB — PHOSPHORUS: Phosphorus: 3.3 mg/dL (ref 2.5–4.6)

## 2021-06-18 LAB — MAGNESIUM
Magnesium: 1.7 mg/dL (ref 1.7–2.4)
Magnesium: 1.8 mg/dL (ref 1.7–2.4)

## 2021-06-18 LAB — D-DIMER, QUANTITATIVE: D-Dimer, Quant: 1.52 ug/mL-FEU — ABNORMAL HIGH (ref 0.00–0.50)

## 2021-06-18 LAB — PROCALCITONIN: Procalcitonin: 0.21 ng/mL

## 2021-06-18 LAB — LACTIC ACID, PLASMA
Lactic Acid, Venous: 1 mmol/L (ref 0.5–1.9)
Lactic Acid, Venous: 0.8 mmol/L (ref 0.5–1.9)

## 2021-06-18 LAB — SODIUM, URINE, RANDOM: Sodium, Ur: 60 mmol/L

## 2021-06-18 LAB — CREATININE, URINE, RANDOM: Creatinine, Urine: 55.52 mg/dL

## 2021-06-18 LAB — C-REACTIVE PROTEIN
CRP: 7.8 mg/dL — ABNORMAL HIGH (ref <1.0)
CRP: 6.8 mg/dL — ABNORMAL HIGH (ref <1.0)

## 2021-06-18 LAB — TROPONIN I (HIGH SENSITIVITY): Troponin I (High Sensitivity): 19 ng/L — ABNORMAL HIGH (ref <18)

## 2021-06-18 LAB — URIC ACID: Uric Acid, Serum: 3.7 mg/dL (ref 2.5–7.1)

## 2021-06-18 LAB — BRAIN NATRIURETIC PEPTIDE: B Natriuretic Peptide: 129.5 pg/mL — ABNORMAL HIGH (ref 0.0–100.0)

## 2021-06-18 LAB — OSMOLALITY: Osmolality: 267 mOsm/kg — ABNORMAL LOW (ref 275–295)

## 2021-06-18 MED ORDER — SODIUM CHLORIDE 1 G PO TABS
1.0000 g | ORAL_TABLET | Freq: Three times a day (TID) | ORAL | Status: DC
  Administered 2021-06-18 – 2021-06-27 (×27): 1 g via ORAL
  Filled 2021-06-18 (×29): qty 1

## 2021-06-18 MED ORDER — SODIUM CHLORIDE 0.9 % IV SOLN
1.0000 g | Freq: Every day | INTRAVENOUS | Status: DC
  Administered 2021-06-18 – 2021-06-22 (×5): 1 g via INTRAVENOUS
  Filled 2021-06-18 (×5): qty 10

## 2021-06-18 MED ORDER — SODIUM CHLORIDE 0.9 % IV SOLN
500.0000 mg | Freq: Every day | INTRAVENOUS | Status: DC
  Administered 2021-06-18 – 2021-06-22 (×5): 500 mg via INTRAVENOUS
  Filled 2021-06-18 (×6): qty 5

## 2021-06-18 MED ORDER — BENZONATATE 100 MG PO CAPS
200.0000 mg | ORAL_CAPSULE | Freq: Three times a day (TID) | ORAL | Status: AC
  Administered 2021-06-18 – 2021-06-20 (×9): 200 mg via ORAL
  Filled 2021-06-18 (×9): qty 2

## 2021-06-18 NOTE — ED Notes (Signed)
Patient resting quietly, cough noted. Denies any needs at this time.

## 2021-06-18 NOTE — ED Notes (Signed)
Spoke with pt.'s daughter [name] for an update on pt.'s condition.

## 2021-06-18 NOTE — Progress Notes (Signed)
PROGRESS NOTE  Stephanie Cortez EVO:[id number] DOB: 03-02-1934 DOA: 06/17/2021 PCP: [name], MD  HPI/Recap of past 24 hours: Stephanie Cortez is a 87 y.o. female with medical history significant for chronic atrial fibrillation on Eliquis, CHF with preserved EF, COPD, hyperlipidemia, chronic hyponatremia, acquired hypothyroidism, who presented to [facility] ED from home with complaints of progressively worsening shortness of breath.  Associated with new onset productive cough.  Work up revealed covid-19 viral pneumonia with concern for superimposed bacterial pneumonary infection.  Started on antiviral Remdesivir, IV steroids, bronchodialators.  IV Rocephin and azithromycin added.  06/16/2021: Patient was seen and examined at bedside.  Productive cough noted on exam.  Denies any chest pain.  Currently on 3 L to maintain a saturation greater than 92%.    Assessment/Plan: Principal Problem:   COVID-19 virus infection Active Problems:   A-fib (HCC)   Hypothyroid   Hyponatremia   Chronic anticoagulation   Chronic obstructive pulmonary disease (HCC)   SOB (shortness of breath)   Acute respiratory failure with hypoxia (HCC)   Severe sepsis (HCC)   Chronic diastolic CHF (congestive heart failure) (HCC)   Chronic anemia  COVID-19 viral pneumonia, with concern for superimposed bacterial pneumonia or infection, POA. Presented without worsening dyspnea x3 days.  Associated with a productive cough. Personally reviewed chest x-ray done on admission which shows multifocal infiltrates suggestive of pneumonia. COVID-19 positive on 06/17/2021 Started on IV remdesivir on 06/17/2021 D2/5. Started on IV Solu-Medrol on 06/17/2021 Start Rocephin and azithromycin and Tessalon Perles. Send sputum for analysis Continue bronchodilators and pulmonary toilet Incentive spirometer Flutter valve Mobilize as tolerated  Acute on chronic hypoxic respiratory failure secondary to COVID-19 viral pneumonia likely  superimposed by bacterial pneumonia. On 2 L nasal cannula nightly at baseline Currently requiring 3 L to maintain a saturation greater than 92% Wean off oxygen supplementation as tolerated Rest of management as stated above  Acute on chronic hyponatremia TSH normal 2.343 Serum sodium 126 from 128 Encourage increase in oral solute intake. We will hold off IV fluid to avoid volume overload and pulmonary edema. Start salt tablet 1 g 3 times daily Repeat BMP  Chronic A. fib on Eliquis Rate is currently controlled, not on AV nodal blocker at this time. Continue Eliquis for CVA prevention  Chronic diastolic CHF Last 2D echo done on 10/28/2019 revealed LVEF 60 to 65%, left ventricle has no regional wall motion abnormalities BNP 129 on 06/18/2021 Continue home p.o. Lasix 20 mg daily Start strict I's and O's and daily weight  Hypothyroidism Continue home levothyroxine  Hyperlipidemia Continue home pravastatin  Physical debility PT OT to assess Fall precautions    Code Status: Full code  Family Communication: Updated her daughter via phone  Disposition Plan: Likely will discharge to home with home health services once oxygen saturation is back to baseline.   Consultants: None  Procedures: None  Antimicrobials: Rocephin 06/18/2021>> Azithromycin 06/18/2021>>>  DVT prophylaxis: Eliquis  Status is: Inpatient  Patient requires at least 2 midnights for further evaluation and treatment of present condition.      Objective: Vitals:   06/18/21 0309 06/18/21 0600 06/18/21 0800 06/18/21 0906  BP:  (!) 142/78 128/66 (!) 163/77  Pulse:  94 95 (!) 105  Resp:  (!) 28 (!) 28 20  Temp: 97.7 F (36.5 C)     TempSrc: Oral     SpO2:  100% 98% 98%  Weight:      Height:        Intake/Output Summary (  Last 24 hours) at 06/18/2021 1056 Last data filed at 06/18/2021 0216 Gross per 24 hour  Intake 200 ml  Output --  Net 200 ml   Filed Weights   06/17/21 1603  Weight:  72.6 kg    Exam:  General: 87 y.o. year-old female well developed well nourished in no acute distress.  Alert and oriented x3. Cardiovascular: Regular rate and rhythm with no rubs or gallops.  No thyromegaly or JVD noted.   Respiratory: Diffuse wheezing bilaterally, mild rales noted at bases. Good inspiratory effort. Abdomen: Soft nontender nondistended with normal bowel sounds x4 quadrants. Musculoskeletal: 1+ pitting edema in lower extremities bilaterally.   Skin: No ulcerative lesions noted or rashes, Psychiatry: Mood is appropriate for condition and setting   Data Reviewed: CBC: Recent Labs  Lab 06/17/21 1603 06/18/21 0440  WBC 3.5* 3.4*  NEUTROABS  --  2.8  HGB 11.0* 10.5*  HCT 34.9* 33.8*  MCV 81.7 82.8  PLT 224 659   Basic Metabolic Panel: Recent Labs  Lab 06/17/21 1603 06/17/21 2343 06/17/21 2357 06/18/21 0440  NA 124*  --  128* 126*  K 3.9  --  3.6 4.4  CL 90*  --  93* 93*  CO2 26  --  26 27  GLUCOSE 129*  --  85 125*  BUN 9  --  8 7*  CREATININE 0.50  --  0.61 0.54  CALCIUM 8.2*  --  8.0* 8.0*  MG  --  1.7  --  1.8  PHOS  --   --   --  3.3   GFR: Estimated Creatinine Clearance: 46.8 mL/min (by C-G formula based on SCr of 0.54 mg/dL). Liver Function Tests: Recent Labs  Lab 06/18/21 0440  AST 53*  ALT 17  ALKPHOS 116  BILITOT 1.4*  PROT 6.7  ALBUMIN 2.7*   No results for input(s): LIPASE, AMYLASE in the last 168 hours. No results for input(s): AMMONIA in the last 168 hours. Coagulation Profile: No results for input(s): INR, PROTIME in the last 168 hours. Cardiac Enzymes: No results for input(s): CKTOTAL, CKMB, CKMBINDEX, TROPONINI in the last 168 hours. BNP (last 3 results) No results for input(s): PROBNP in the last 8760 hours. HbA1C: No results for input(s): HGBA1C in the last 72 hours. CBG: No results for input(s): GLUCAP in the last 168 hours. Lipid Profile: No results for input(s): CHOL, HDL, LDLCALC, TRIG, CHOLHDL, LDLDIRECT in the  last 72 hours. Thyroid Function Tests: Recent Labs    06/18/21 0040  TSH 2.343   Anemia Panel: No results for input(s): VITAMINB12, FOLATE, FERRITIN, TIBC, IRON, RETICCTPCT in the last 72 hours. Urine analysis:    Component Value Date/Time   COLORURINE YELLOW 06/17/2021 [place] 06/17/2021 1655   LABSPEC 1.010 06/17/2021 1655   PHURINE 7.0 06/17/2021 1655   GLUCOSEU NEGATIVE 06/17/2021 1655   HGBUR NEGATIVE 06/17/2021 [place] 06/17/2021 1655   BILIRUBINUR Neg 02/06/2012 1022   KETONESUR NEGATIVE 06/17/2021 1655   PROTEINUR 30 (A) 06/17/2021 1655   UROBILINOGEN 0.2 04/07/2012 1443   NITRITE NEGATIVE 06/17/2021 1655   LEUKOCYTESUR TRACE (A) 06/17/2021 1655   Sepsis Labs: @LABRCNTIP (procalcitonin:4,lacticidven:4)  ) Recent Results (from the past 240 hour(s))  Resp Panel by RT-PCR (Flu A&B, Covid) Nasopharyngeal Swab     Status: Abnormal   Collection Time: 06/17/21  4:03 PM   Specimen: Nasopharyngeal Swab; Nasopharyngeal(NP) swabs in vial transport medium  Result Value Ref Range Status   SARS Coronavirus 2 by  RT PCR POSITIVE (A) NEGATIVE Final    Comment: (NOTE) SARS-CoV-2 target nucleic acids are DETECTED.  The SARS-CoV-2 RNA is generally detectable in upper respiratory specimens during the acute phase of infection. Positive results are indicative of the presence of the identified virus, but do not rule out bacterial infection or co-infection with other pathogens not detected by the test. Clinical correlation with patient history and other diagnostic information is necessary to determine patient infection status. The expected result is Negative.  Fact Sheet for Patients: EntrepreneurPulse.com.au  Fact Sheet for Healthcare Providers: IncredibleEmployment.be  This test is not yet approved or cleared by the [place] FDA and  has been authorized for detection and/or diagnosis of SARS-CoV-2  by FDA under an Emergency Use Authorization (EUA).  This EUA will remain in effect (meaning this test can be used) for the duration of  the COVID-19 declaration under Section 564(b)(1) of the A ct, 21 U.S.C. section 360bbb-3(b)(1), unless the authorization is terminated or revoked sooner.     Influenza A by PCR NEGATIVE NEGATIVE Final   Influenza B by PCR NEGATIVE NEGATIVE Final    Comment: (NOTE) The Xpert Xpress SARS-CoV-2/FLU/RSV plus assay is intended as an aid in the diagnosis of influenza from Nasopharyngeal swab specimens and should not be used as a sole basis for treatment. Nasal washings and aspirates are unacceptable for Xpert Xpress SARS-CoV-2/FLU/RSV testing.  Fact Sheet for Patients: EntrepreneurPulse.com.au  Fact Sheet for Healthcare Providers: IncredibleEmployment.be  This test is not yet approved or cleared by the [place] FDA and has been authorized for detection and/or diagnosis of SARS-CoV-2 by FDA under an Emergency Use Authorization (EUA). This EUA will remain in effect (meaning this test can be used) for the duration of the COVID-19 declaration under Section 564(b)(1) of the Act, 21 U.S.C. section 360bbb-3(b)(1), unless the authorization is terminated or revoked.  Performed at [facility], [place] [address] [place], [place] [address]       Studies: DG Chest Port 1 View  Result Date: 06/17/2021 CLINICAL DATA:  Positive COVID test.  Productive cough for 3 days. EXAM: PORTABLE CHEST 1 VIEW COMPARISON:  02/19/2020 and older studies. FINDINGS: Cardiac silhouette mildly enlarged.  No mediastinal or hilar masses. Lungs hyperexpanded. Coarse heterogeneous interstitial thickening with intervening ground-glass opacities, most evident in the upper lobes, similar to the prior exam. Subtle opacity at the right lung base has increased from the prior exam, which could reflect progressive fibrosis or acute  infiltrate. No evidence of pulmonary edema. No convincing pleural effusion and no pneumothorax. Old proximal left humeral fracture, healed. IMPRESSION: 1. Mild reticular and subtle hazy airspace opacity at the right lung base new since the prior exam. This may reflect pneumonia or atelectasis or progression of pulmonary fibrosis. 2. Other changes of upper lobe predominant pulmonary fibrosis are stable. No other evidence of acute cardiopulmonary disease. Electronically Signed   By: Lajean Manes M.D.   On: 06/17/2021 16:11    Scheduled Meds:  apixaban  2.5 mg Oral BID   furosemide  20 mg Oral Daily   Ipratropium-Albuterol  1 puff Inhalation Q6H WA   levothyroxine  50 mcg Oral Daily   methylPREDNISolone (SOLU-MEDROL) injection  0.5 mg/kg Intravenous Q12H   Followed by   Derrill Memo ON 06/21/2021] predniSONE  50 mg Oral Daily   pravastatin  40 mg Oral QHS    Continuous Infusions:  remdesivir 100 mg in NS 100 mL 100 mg (06/18/21 1008)     LOS: 1 day  Kayleen Memos, MD [facility] Pager [phone]  If 7PM-7AM, please contact night-coverage [email or web address] Password [email or web address] 06/18/2021, 10:56 AM

## 2021-06-18 NOTE — Progress Notes (Signed)
Pt instructed on use of Flutter valve.  Pt demonstrated with fair effort and technique.

## 2021-06-19 ENCOUNTER — Telehealth: Payer: Self-pay

## 2021-06-19 LAB — COMPREHENSIVE METABOLIC PANEL
ALT: 18 U/L (ref 0–44)
AST: 33 U/L (ref 15–41)
Albumin: 2.4 g/dL — ABNORMAL LOW (ref 3.5–5.0)
Alkaline Phosphatase: 108 U/L (ref 38–126)
Anion gap: 8 (ref 5–15)
BUN: 15 mg/dL (ref 8–23)
CO2: 27 mmol/L (ref 22–32)
Calcium: 8.4 mg/dL — ABNORMAL LOW (ref 8.9–10.3)
Chloride: 93 mmol/L — ABNORMAL LOW (ref 98–111)
Creatinine, Ser: 0.45 mg/dL (ref 0.44–1.00)
GFR, Estimated: >60 mL/min (ref >60)
Glucose, Bld: 199 mg/dL — ABNORMAL HIGH (ref 70–99)
Potassium: 3.8 mmol/L (ref 3.5–5.1)
Sodium: 128 mmol/L — ABNORMAL LOW (ref 135–145)
Total Bilirubin: 0.6 mg/dL (ref 0.3–1.2)
Total Protein: 6.3 g/dL — ABNORMAL LOW (ref 6.5–8.1)

## 2021-06-19 LAB — C-REACTIVE PROTEIN: CRP: 5.6 mg/dL — ABNORMAL HIGH (ref <1.0)

## 2021-06-19 LAB — CBC
HCT: 34.9 % — ABNORMAL LOW (ref 36.0–46.0)
Hemoglobin: 11 g/dL — ABNORMAL LOW (ref 12.0–15.0)
MCH: 25.5 pg — ABNORMAL LOW (ref 26.0–34.0)
MCHC: 31.5 g/dL (ref 30.0–36.0)
MCV: 81 fL (ref 80.0–100.0)
Platelets: 219 10*3/uL (ref 150–400)
RBC: 4.31 MIL/uL (ref 3.87–5.11)
RDW: 17 % — ABNORMAL HIGH (ref 11.5–15.5)
WBC: 6.4 10*3/uL (ref 4.0–10.5)
nRBC: 0 % (ref 0.0–0.2)

## 2021-06-19 LAB — MAGNESIUM: Magnesium: 1.9 mg/dL (ref 1.7–2.4)

## 2021-06-19 LAB — D-DIMER, QUANTITATIVE: D-Dimer, Quant: 1.09 ug/mL-FEU — ABNORMAL HIGH (ref 0.00–0.50)

## 2021-06-19 LAB — PHOSPHORUS: Phosphorus: 1.8 mg/dL — ABNORMAL LOW (ref 2.5–4.6)

## 2021-06-19 NOTE — Telephone Encounter (Signed)
(  12:45 pm) Palliative care SW returned call to patient's daughter-[name]. The daughter advised that patient is currently in the hospital at [facility]. Patient is being treated for COVID pneumonia. SW advised [name] that the palliative care visit for tomorrow will be cancelled. Instead, the hospital liaison will follow patient while she is in the hospital. [name] verbalized no other concerns noted. *Palliative admin and Hospital liaison team notified for follow-up.

## 2021-06-19 NOTE — Progress Notes (Signed)
[facility]) Hospital Liaison note:  This is a pending outpatient-based Palliative Care patient. Will continue to follow for disposition.  Please call with any outpatient palliative questions or concerns.  Thank you, Lorelee Market, LPN [facility] Liaison [phone]

## 2021-06-19 NOTE — Evaluation (Signed)
Physical Therapy Evaluation Patient Details Name: Stephanie Cortez MRN: [id number] DOB: 04-Oct-1933 Today's Date: 06/19/2021  History of Present Illness  87 y.o. female admitted with COVID Pna, sepsis, after falling at home.  history significant for chronic atrial fibrillation on Eliquis, CHF with preserved EF, COPD, hyperlipidemia, chronic hyponatremia, acquired hypothyroidism, who presented to [facility] ED from home with complaints of progressively worsening shortness of breath.   Clinical Impression  On eval, pt required Min assist +2 for mobility. She walked ~20 feet with a RW. Pt presents with general weakness, decreased activity tolerance, and impaired gait and balance. She is at risk for falls when mobilizing. O2 91% on RA during session. Will plan to follow during hospital stay. PT recommendation is for ST SNF rehab. If pt declines placement, she will need HHPT and 24/7 supervision/assist.        Recommendations for follow up therapy are one component of a multi-disciplinary discharge planning process, led by the attending physician.  Recommendations may be updated based on patient status, additional functional criteria and insurance authorization.  Follow Up Recommendations Skilled nursing-short term rehab (<3 hours/day)    Assistance Recommended at Discharge Frequent or constant Supervision/Assistance  Functional Status Assessment Patient has had a recent decline in their functional status and demonstrates the ability to make significant improvements in function in a reasonable and predictable amount of time.   Equipment Recommendations  None recommended by PT    Recommendations for Other Services       Precautions / Restrictions Precautions Precautions: Fall Precaution Comments: monitor O2 Restrictions Weight Bearing Restrictions: No      Mobility  Bed Mobility Overal bed mobility: Needs Assistance Bed Mobility: Supine to Sit;Sit to Supine     Supine to sit: Mod assist;HOB  elevated Sit to supine: Mod assist;HOB elevated   General bed mobility comments: Assist for trunk and LEs. Increased time. Cues provided. Pt relied on bedrail. Moderate posterior bias in sitting with intermittent loss of balance    Transfers Overall transfer level: Needs assistance Equipment used: Rolling walker (2 wheels) Transfers: Sit to/from Stand Sit to Stand: Min assist;+2 physical assistance;+2 safety/equipment;From elevated surface           General transfer comment: Assist to power up, stabilize, control descent. Cues for safety, technique, hand placement. Initial posterior bias    Ambulation/Gait Ambulation/Gait assistance: Min assist;+2 physical assistance;+2 safety/equipment Gait Distance (Feet): 20 Feet Assistive device: Rolling walker (2 wheels) Gait Pattern/deviations: Step-to pattern;Trunk flexed;Decreased step length - right;Decreased step length - left       General Gait Details: Assist to stabilize throughout distance and to maneuver with RW. Fall risk. O2 91% on RA, dyspnea 2/4  Stairs            Wheelchair Mobility    Modified Rankin (Stroke Patients Only)       Balance Overall balance assessment: Needs assistance;History of Falls Sitting-balance support: Bilateral upper extremity supported Sitting balance-Leahy Scale: Poor Sitting balance - Comments: required min A to balance/seated  EOB x 4-5 minutes Postural control: Posterior lean Standing balance support: Bilateral upper extremity supported Standing balance-Leahy Scale: Poor                               Pertinent Vitals/Pain Pain Assessment: Faces Faces Pain Scale: Hurts little more Pain Location: R hip/groin area Pain Descriptors / Indicators: Discomfort;Sore Pain Intervention(s): Limited activity within patient's tolerance;Monitored during session;Repositioned    Home Living  Family/patient expects to be discharged to:: Private residence Living Arrangements:  Alone Available Help at Discharge: Family;Friend(s);Available PRN/intermittently Type of Home: House Home Access: Stairs to enter Entrance Stairs-Rails: Psychiatric nurse of Steps: 2   Home Layout: One level Home Equipment: Grab bars - tub/shower;Shower seat - built Medical sales representative (2 wheels);Wheelchair - manual Additional Comments: daughter helps her into shower    Prior Function Prior Level of Function : Independent/Modified Independent             Mobility Comments: pt reports that sh eused a RW for mobility ADLs Comments: Pt reports that she was Ind with ADLs/selfcare, light meal prep, does not drive     Hand Dominance   Dominant Hand: Right    Extremity/Trunk Assessment   Upper Extremity Assessment Upper Extremity Assessment: Defer to OT evaluation    Lower Extremity Assessment Lower Extremity Assessment: Generalized weakness    Cervical / Trunk Assessment Cervical / Trunk Assessment: Kyphotic  Communication   Communication: No difficulties  Cognition Arousal/Alertness: Awake/alert Behavior During Therapy: WFL for tasks assessed/performed Overall Cognitive Status: No family/caregiver present to determine baseline cognitive functioning                                 General Comments: for the most part, cognition appeared [facility]        General Comments      Exercises     Assessment/Plan    PT Assessment Patient needs continued PT services  PT Problem List Decreased strength;Decreased mobility;Decreased activity tolerance;Decreased balance;Decreased knowledge of use of DME       PT Treatment Interventions DME instruction;Gait training;Therapeutic activities;Therapeutic exercise;Patient/family education;Balance training;Functional mobility training    PT Goals (Current goals can be found in the Care Plan section)  Acute Rehab PT Goals Patient Stated Goal: to get better and return home PT Goal Formulation: With  patient Time For Goal Achievement: 07/03/21 Potential to Achieve Goals: Good    Frequency Min 3X/week   Barriers to discharge        Co-evaluation               AM-PAC PT "6 Clicks" Mobility  Outcome Measure Help needed turning from your back to your side while in a flat bed without using bedrails?: A Lot Help needed moving from lying on your back to sitting on the side of a flat bed without using bedrails?: A Lot Help needed moving to and from a bed to a chair (including a wheelchair)?: A Lot Help needed standing up from a chair using your arms (e.g., wheelchair or bedside chair)?: A Lot Help needed to walk in hospital room?: A Lot Help needed climbing 3-5 steps with a railing? : Total 6 Click Score: 11    End of Session Equipment Utilized During Treatment: Gait belt Activity Tolerance: Patient tolerated treatment well Patient left: in bed;with call bell/phone within reach;with bed alarm set   PT Visit Diagnosis: Muscle weakness (generalized) (M62.81);History of falling (Z91.81);Unsteadiness on feet (R26.81);Difficulty in walking, not elsewhere classified (R26.2)    Time: 9528-4132 PT Time Calculation (min) (ACUTE ONLY): 27 min   Charges:   PT Evaluation $PT Eval Moderate Complexity: 1 Mod PT Treatments $Gait Training: 8-22 mins          Doreatha Massed, PT Acute Rehabilitation  Office: [phone] Pager: [phone]

## 2021-06-19 NOTE — Evaluation (Signed)
Occupational Therapy Evaluation Patient Details Name: Stephanie Cortez MRN: [id number] DOB: 1933-08-01 Today's Date: 06/19/2021   History of Present Illness Stephanie Cortez is a 87 y.o. female with medical history significant for chronic atrial fibrillation on Eliquis, CHF with preserved EF, COPD, hyperlipidemia, chronic hyponatremia, acquired hypothyroidism, who presented to [facility] ED from home with complaints of progressively worsening shortness of breath.  Associated with new onset productive cough.  Work up revealed covid-19 viral pneumonia with concern for superimposed bacterial pneumonary infection   Clinical Impression   Pt presents with decline in function and safety with ADLs and ADL mobility with impaired strength, balance, endurance and cognition. Pt reports that PTA she live day home alone and was Ind with ADLs/selcare, light meal prep, used a RW for mobility and that he daughter daughter (lives 10 minutes from her) assists her getting in and out of shower/on and off shower seat. Pt currently requires mod A to sit EOB, unable to stand, mod A with UB ADLs, max A - total A with LB ADLs and total A with toileting at bed level. Pt with some confusion and had difficulty comprehending instructions once seated EOB. Pt would benefit from acute OT services to address impairments to maximize level of function and safety     Recommendations for follow up therapy are one component of a multi-disciplinary discharge planning process, led by the attending physician.  Recommendations may be updated based on patient status, additional functional criteria and insurance authorization.   Follow Up Recommendations  Skilled nursing-short term rehab (<3 hours/day)    Assistance Recommended at Discharge Frequent or constant Supervision/Assistance  Functional Status Assessment  Patient has had a recent decline in their functional status and demonstrates the ability to make significant improvements in function in  a reasonable and predictable amount of time.  Equipment Recommendations  Other (comment) (TBD at next venue of care)    Recommendations for Other Services       Precautions / Restrictions Precautions Precautions: Fall Restrictions Weight Bearing Restrictions: No      Mobility Bed Mobility Overal bed mobility: Needs Assistance Bed Mobility: Supine to Sit;Sit to Supine     Supine to sit: Mod assist;Min assist Sit to supine: Mod assist   General bed mobility comments: min A with LEs off EOB, mod A to elevate trunk. Mod A with LEs back onto bed. Pt able to scoot self to [facility] using rails    Transfers                   General transfer comment: unable; unable to follow instructions for hand placement and to keep her feet on the floow while seated EOB      Balance Overall balance assessment: Needs assistance Sitting-balance support: Bilateral upper extremity supported Sitting balance-Leahy Scale: Poor Sitting balance - Comments: required min A to balance/seated  EOB x 4-5 minutes Postural control: Posterior lean                                 ADL either performed or assessed with clinical judgement   ADL Overall ADL's : Needs assistance/impaired Eating/Feeding: Set up;Sitting;Bed level   Grooming: Wash/dry hands;Wash/dry face;Minimal assistance;Sitting   Upper Body Bathing: Moderate assistance;Sitting   Lower Body Bathing: Maximal assistance   Upper Body Dressing : Moderate assistance;Sitting   Lower Body Dressing: Total assistance       Toileting- Clothing Manipulation and Hygiene: Total assistance;Bed  level         General ADL Comments: pt limited by cognition and poor activity tolrerance     Vision Baseline Vision/History: 1 Wears glasses Ability to See in Adequate Light: 0 Adequate Patient Visual Report: No change from baseline       Perception     Praxis      Pertinent Vitals/Pain Pain Assessment: No/denies pain      Hand Dominance Right   Extremity/Trunk Assessment Upper Extremity Assessment Upper Extremity Assessment: Generalized weakness   Lower Extremity Assessment Lower Extremity Assessment: Defer to PT evaluation       Communication Communication Communication: No difficulties   Cognition Arousal/Alertness: Awake/alert Behavior During Therapy: [facility] for tasks assessed/performed Overall Cognitive Status: No family/caregiver present to determine baseline cognitive functioning                                 General Comments: pt stated that the month was June, got year 2022 correct but could not name [place] Comments       Exercises     Shoulder Instructions      Home Living Family/patient expects to be discharged to:: Private residence Living Arrangements: Alone Available Help at Discharge: Family;Friend(s);Available PRN/intermittently Type of Home: House Home Access: Stairs to enter [employer] of Steps: 2 Entrance Stairs-Rails: Right;Left Home Layout: One level     Bathroom Shower/Tub: Tub/shower unit;Walk-in shower   Bathroom Toilet: Handicapped height     Home Equipment: Grab bars - tub/shower;Shower seat - built Medical sales representative (2 wheels);Wheelchair - manual   Additional Comments: daughter helps her into shower      Prior Functioning/Environment Prior Level of Function : Independent/Modified Independent             Mobility Comments: pt reports that sh eused a RW for mobility ADLs Comments: Pt reports that she was Ind with ADLs/selfcare, light meal prep, does not drive        OT Problem List: Decreased strength;Impaired balance (sitting and/or standing);Decreased cognition;Decreased safety awareness;Decreased activity tolerance;Decreased coordination;Decreased knowledge of use of DME or AE      OT Treatment/Interventions: Self-care/ADL training;Patient/family education;Balance training;Therapeutic  activities;DME and/or AE instruction    OT Goals(Current goals can be found in the care plan section) Acute Rehab OT Goals Patient Stated Goal: go home OT Goal Formulation: With patient Time For Goal Achievement: 07/03/21 Potential to Achieve Goals: Good ADL Goals Pt Will Perform Grooming: with min guard assist;with supervision;sitting Pt Will Perform Upper Body Bathing: with min assist;with min guard assist;sitting Pt Will Perform Upper Body Dressing: with min assist;with min guard assist;sitting Pt Will Transfer to Toilet: with max assist;with mod assist;stand pivot transfer;bedside commode Additional ADL Goal #1: Pt will sit EOB min A in prep for grooming and UB ADL tasks  OT Frequency: Min 2X/week   Barriers to D/C:            Co-evaluation              AM-PAC OT "6 Clicks" Daily Activity     Outcome Measure Help from another person eating meals?: A Little Help from another person taking care of personal grooming?: A Little Help from another person toileting, which includes using toliet, bedpan, or urinal?: Total Help from another person bathing (including washing, rinsing, drying)?: A Lot Help from another person to put on and taking off regular upper body clothing?: A  Lot Help from another person to put on and taking off regular lower body clothing?: Total 6 Click Score: 12   End of Session Equipment Utilized During Treatment: Rolling walker (2 wheels);Gait belt Nurse Communication: Mobility status  Activity Tolerance: Patient limited by fatigue;Other (comment) (limited by cognition) Patient left: in bed;with call bell/phone within reach;with bed alarm set  OT Visit Diagnosis: Other abnormalities of gait and mobility (R26.89);History of falling (Z91.81);Muscle weakness (generalized) (M62.81);Other symptoms and signs involving cognitive function                Time: 6435-3912 OT Time Calculation (min): 25 min Charges:  OT General Charges $OT Visit: 1 Visit OT  Evaluation $OT Eval Moderate Complexity: 1 Mod OT Treatments $Therapeutic Activity: 8-22 mins    Britt Bottom 06/19/2021, 2:21 PM

## 2021-06-19 NOTE — Progress Notes (Signed)
PROGRESS NOTE  Stephanie Cortez BTD:[id number] DOB: 12-06-1933 DOA: 06/17/2021 PCP: [name], MD  HPI/Recap of past 24 hours: Stephanie Cortez is a 87 y.o. female with medical history significant for chronic atrial fibrillation on Eliquis, CHF with preserved EF, COPD, hyperlipidemia, chronic hyponatremia, acquired hypothyroidism, who presented to [facility] ED from home with complaints of progressively worsening shortness of breath.  Associated with new onset productive cough.  Work up revealed covid-19 viral pneumonia with concern for superimposed bacterial pneumonary infection.  Started on antiviral Remdesivir, IV steroids, bronchodialators.  IV Rocephin and azithromycin added.  06/19/2021: Patient was seen and examined at her bedside.  There were no acute events overnight.  She states her cough is improved.  She has no new complaints.  No chest pain.    Assessment/Plan: Principal Problem:   COVID-19 virus infection Active Problems:   A-fib (HCC)   Hypothyroid   Hyponatremia   Chronic anticoagulation   Chronic obstructive pulmonary disease (HCC)   SOB (shortness of breath)   Acute respiratory failure with hypoxia (HCC)   Severe sepsis (HCC)   Chronic diastolic CHF (congestive heart failure) (HCC)   Chronic anemia  COVID-19 viral pneumonia, with concern for superimposed bacterial pneumonia or infection, POA. Presented without worsening dyspnea x3 days.  Associated with a productive cough. Personally reviewed chest x-ray done on admission which shows multifocal infiltrates suggestive of pneumonia. COVID-19 positive on 06/17/2021 Started on IV remdesivir on 06/17/2021 D3/5, continue. Started on IV Solu-Medrol on 06/17/2021, continue Start Rocephin and azithromycin and Tessalon Perles, continue. Send sputum for analysis Continue bronchodilators and pulmonary toilet Continue incentive spirometer Continue flutter valve Continue to mobilize as tolerated  Acute on chronic hypoxic  respiratory failure secondary to COVID-19 viral pneumonia likely superimposed by bacterial pneumonia. On 2 L nasal cannula nightly at baseline Oxygen requirement is improving, 100% on 3 L. Wean off oxygen supplementation as tolerated Rest of management as stated above Home oxygen evaluation on 06/19/2021.  Acute on chronic hyponatremia, improving. TSH normal 2.343 Serum sodium 128 from 126 from 128 Encourage increase in oral solute intake. We will hold off IV fluid to avoid volume overload and pulmonary edema. Continue salt tablet 1 g 3 times daily Repeat BMP  Chronic A. fib on Eliquis Rate is currently controlled, not on AV nodal blocker at this time. Continue Eliquis for CVA prevention  Chronic diastolic CHF Last 2D echo done on 10/28/2019 revealed LVEF 60 to 65%, left ventricle has no regional wall motion abnormalities BNP 129 on 06/18/2021 Continue home p.o. Lasix 20 mg daily Continue strict I's and O's and daily weight  Hypothyroidism Continue home levothyroxine  Hyperlipidemia Continue home pravastatin  Physical debility PT OT to assess Fall precautions    Code Status: Full code  Family Communication: Updated her daughter via phone  Disposition Plan: Likely will discharge to home with home health services once oxygen saturation is back to baseline.   Consultants: None  Procedures: None  Antimicrobials: Rocephin 06/18/2021>> Azithromycin 06/18/2021>>>  DVT prophylaxis: Eliquis  Status is: Inpatient  Patient requires at least 2 midnights for further evaluation and treatment of present condition.      Objective: Vitals:   06/19/21 0131 06/19/21 0437 06/19/21 0456 06/19/21 1143  BP: 113/62 110/65  118/69  Pulse: 80 89  86  Resp: 16 (!) 21  20  Temp: 97.6 F (36.4 C) 97.7 F (36.5 C)    TempSrc: Oral Oral    SpO2: 97% 100%  94%  Weight:  63 kg   Height:        Intake/Output Summary (Last 24 hours) at 06/19/2021 1216 Last data filed at  06/18/2021 2204 Gross per 24 hour  Intake 680.84 ml  Output --  Net 680.84 ml   Filed Weights   06/17/21 1603 06/18/21 2026 06/19/21 0456  Weight: 72.6 kg 63.6 kg 63 kg    Exam:  General: 87 y.o. year-old female well-developed well-nourished in no acute distress.  She is alert and oriented x3. Cardiovascular: Regular rate and rhythm no rubs or gallops.  No JVD or thyromegaly noted.   Respiratory: Clear to auscultation no wheezes or rales.  Poor inspiratory effort.   Abdomen: Soft nontender normal bowel sounds present.   Musculoskeletal: Trace lower extremity edema bilaterally. Skin: No ulcerative lesions noted.   Psychiatry: Mood is appropriate for condition and setting.   Data Reviewed: CBC: Recent Labs  Lab 06/17/21 1603 06/18/21 0440 06/19/21 0605  WBC 3.5* 3.4* 6.4  NEUTROABS  --  2.8  --   HGB 11.0* 10.5* 11.0*  HCT 34.9* 33.8* 34.9*  MCV 81.7 82.8 81.0  PLT 224 230 267   Basic Metabolic Panel: Recent Labs  Lab 06/17/21 1603 06/17/21 2343 06/17/21 2357 06/18/21 0440 06/19/21 0605  NA 124*  --  128* 126* 128*  K 3.9  --  3.6 4.4 3.8  CL 90*  --  93* 93* 93*  CO2 26  --  26 27 27   GLUCOSE 129*  --  85 125* 199*  BUN 9  --  8 7* 15  CREATININE 0.50  --  0.61 0.54 0.45  CALCIUM 8.2*  --  8.0* 8.0* 8.4*  MG  --  1.7  --  1.8 1.9  PHOS  --   --   --  3.3 1.8*   GFR: Estimated Creatinine Clearance: 50 mL/min (by C-G formula based on SCr of 0.45 mg/dL). Liver Function Tests: Recent Labs  Lab 06/18/21 0440 06/19/21 0605  AST 53* 33  ALT 17 18  ALKPHOS 116 108  BILITOT 1.4* 0.6  PROT 6.7 6.3*  ALBUMIN 2.7* 2.4*   No results for input(s): LIPASE, AMYLASE in the last 168 hours. No results for input(s): AMMONIA in the last 168 hours. Coagulation Profile: No results for input(s): INR, PROTIME in the last 168 hours. Cardiac Enzymes: No results for input(s): CKTOTAL, CKMB, CKMBINDEX, TROPONINI in the last 168 hours. BNP (last 3 results) No results for  input(s): PROBNP in the last 8760 hours. HbA1C: No results for input(s): HGBA1C in the last 72 hours. CBG: No results for input(s): GLUCAP in the last 168 hours. Lipid Profile: No results for input(s): CHOL, HDL, LDLCALC, TRIG, CHOLHDL, LDLDIRECT in the last 72 hours. Thyroid Function Tests: Recent Labs    06/18/21 0040  TSH 2.343   Anemia Panel: No results for input(s): VITAMINB12, FOLATE, FERRITIN, TIBC, IRON, RETICCTPCT in the last 72 hours. Urine analysis:    Component Value Date/Time   COLORURINE YELLOW 06/17/2021 [place] 06/17/2021 1655   LABSPEC 1.010 06/17/2021 1655   PHURINE 7.0 06/17/2021 1655   GLUCOSEU NEGATIVE 06/17/2021 1655   HGBUR NEGATIVE 06/17/2021 [place] 06/17/2021 1655   BILIRUBINUR Neg 02/06/2012 1022   KETONESUR NEGATIVE 06/17/2021 1655   PROTEINUR 30 (A) 06/17/2021 1655   UROBILINOGEN 0.2 04/07/2012 1443   NITRITE NEGATIVE 06/17/2021 1655   LEUKOCYTESUR TRACE (A) 06/17/2021 1655   Sepsis Labs: @LABRCNTIP (procalcitonin:4,lacticidven:4)  ) Recent Results (from the past 240  hour(s))  Resp Panel by RT-PCR (Flu A&B, Covid) Nasopharyngeal Swab     Status: Abnormal   Collection Time: 06/17/21  4:03 PM   Specimen: Nasopharyngeal Swab; Nasopharyngeal(NP) swabs in vial transport medium  Result Value Ref Range Status   SARS Coronavirus 2 by RT PCR POSITIVE (A) NEGATIVE Final    Comment: (NOTE) SARS-CoV-2 target nucleic acids are DETECTED.  The SARS-CoV-2 RNA is generally detectable in upper respiratory specimens during the acute phase of infection. Positive results are indicative of the presence of the identified virus, but do not rule out bacterial infection or co-infection with other pathogens not detected by the test. Clinical correlation with patient history and other diagnostic information is necessary to determine patient infection status. The expected result is Negative.  Fact Sheet for  Patients: EntrepreneurPulse.com.au  Fact Sheet for Healthcare Providers: IncredibleEmployment.be  This test is not yet approved or cleared by the [place] FDA and  has been authorized for detection and/or diagnosis of SARS-CoV-2 by FDA under an Emergency Use Authorization (EUA).  This EUA will remain in effect (meaning this test can be used) for the duration of  the COVID-19 declaration under Section 564(b)(1) of the A ct, 21 U.S.C. section 360bbb-3(b)(1), unless the authorization is terminated or revoked sooner.     Influenza A by PCR NEGATIVE NEGATIVE Final   Influenza B by PCR NEGATIVE NEGATIVE Final    Comment: (NOTE) The Xpert Xpress SARS-CoV-2/FLU/RSV plus assay is intended as an aid in the diagnosis of influenza from Nasopharyngeal swab specimens and should not be used as a sole basis for treatment. Nasal washings and aspirates are unacceptable for Xpert Xpress SARS-CoV-2/FLU/RSV testing.  Fact Sheet for Patients: EntrepreneurPulse.com.au  Fact Sheet for Healthcare Providers: IncredibleEmployment.be  This test is not yet approved or cleared by the [place] FDA and has been authorized for detection and/or diagnosis of SARS-CoV-2 by FDA under an Emergency Use Authorization (EUA). This EUA will remain in effect (meaning this test can be used) for the duration of the COVID-19 declaration under Section 564(b)(1) of the Act, 21 U.S.C. section 360bbb-3(b)(1), unless the authorization is terminated or revoked.  Performed at [facility], [place] [address] [place], [place] [address]   Culture, blood (Routine X 2) w Reflex to ID Panel     Status: None (Preliminary result)   Collection Time: 06/18/21  1:20 AM   Specimen: BLOOD  Result Value Ref Range Status   Specimen Description   Final    BLOOD RIGHT ANTECUBITAL Performed at [place] [address] [place], [place] [address]    Special Requests   Final    BOTTLES DRAWN AEROBIC AND ANAEROBIC Blood Culture adequate volume Performed at [place] [address] [place], [place] [address]    Culture   Final    NO GROWTH 1 DAY Performed at [facility], [place] [address], [place], [place] [address]    Report Status PENDING  Incomplete  Culture, blood (Routine X 2) w Reflex to ID Panel     Status: None (Preliminary result)   Collection Time: 06/18/21  1:30 AM   Specimen: BLOOD  Result Value Ref Range Status   Specimen Description   Final    BLOOD BLOOD LEFT FOREARM Performed at [place] [address] [place],  [address]    Special Requests   Final    BOTTLES DRAWN AEROBIC AND ANAEROBIC Blood Culture adequate volume Performed at [facility]  [place] [address] [place], [place] [address]    Culture   Final    NO GROWTH 1 DAY Performed at [facility], [place] [address], [place], [place] [address]    Report Status PENDING  Incomplete      Studies: No results found.  Scheduled Meds:  apixaban  2.5 mg Oral BID   benzonatate  200 mg Oral TID   furosemide  20 mg Oral Daily   Ipratropium-Albuterol  1 puff Inhalation Q6H WA   levothyroxine  50 mcg Oral Daily   methylPREDNISolone (SOLU-MEDROL) injection  0.5 mg/kg Intravenous Q12H   Followed by   Derrill Memo ON 06/21/2021] predniSONE  50 mg Oral Daily   pravastatin  40 mg Oral QHS   sodium chloride  1 g Oral TID WC    Continuous Infusions:  azithromycin 500 mg (06/19/21 1110)   cefTRIAXone (ROCEPHIN)  IV 1 g (06/19/21 1012)   remdesivir 100 mg in NS 100 mL 100 mg (06/19/21 0937)     LOS: 2 days     Kayleen Memos, MD [facility] Pager [phone]  If 7PM-7AM, please contact night-coverage [email or web address] Password [email or web address] 06/19/2021, 12:16 PM

## 2021-06-20 ENCOUNTER — Other Ambulatory Visit: Payer: Medicare Other | Admitting: *Deleted

## 2021-06-20 ENCOUNTER — Other Ambulatory Visit: Payer: Medicare Other

## 2021-06-20 LAB — CBC
HCT: 35.8 % — ABNORMAL LOW (ref 36.0–46.0)
Hemoglobin: 11.3 g/dL — ABNORMAL LOW (ref 12.0–15.0)
MCH: 25.6 pg — ABNORMAL LOW (ref 26.0–34.0)
MCHC: 31.6 g/dL (ref 30.0–36.0)
MCV: 81.2 fL (ref 80.0–100.0)
Platelets: 254 10*3/uL (ref 150–400)
RBC: 4.41 MIL/uL (ref 3.87–5.11)
RDW: 17.1 % — ABNORMAL HIGH (ref 11.5–15.5)
WBC: 11.4 10*3/uL — ABNORMAL HIGH (ref 4.0–10.5)
nRBC: 0 % (ref 0.0–0.2)

## 2021-06-20 LAB — COMPREHENSIVE METABOLIC PANEL
ALT: 18 U/L (ref 0–44)
AST: 29 U/L (ref 15–41)
Albumin: 2.8 g/dL — ABNORMAL LOW (ref 3.5–5.0)
Alkaline Phosphatase: 100 U/L (ref 38–126)
Anion gap: 9 (ref 5–15)
BUN: 16 mg/dL (ref 8–23)
CO2: 26 mmol/L (ref 22–32)
Calcium: 8.5 mg/dL — ABNORMAL LOW (ref 8.9–10.3)
Chloride: 94 mmol/L — ABNORMAL LOW (ref 98–111)
Creatinine, Ser: 0.52 mg/dL (ref 0.44–1.00)
GFR, Estimated: >60 mL/min (ref >60)
Glucose, Bld: 148 mg/dL — ABNORMAL HIGH (ref 70–99)
Potassium: 3.8 mmol/L (ref 3.5–5.1)
Sodium: 129 mmol/L — ABNORMAL LOW (ref 135–145)
Total Bilirubin: 0.7 mg/dL (ref 0.3–1.2)
Total Protein: 6.6 g/dL (ref 6.5–8.1)

## 2021-06-20 LAB — D-DIMER, QUANTITATIVE: D-Dimer, Quant: 0.89 ug/mL-FEU — ABNORMAL HIGH (ref 0.00–0.50)

## 2021-06-20 LAB — C-REACTIVE PROTEIN: CRP: 2.8 mg/dL — ABNORMAL HIGH (ref <1.0)

## 2021-06-20 MED ORDER — FUROSEMIDE 10 MG/ML IJ SOLN
20.0000 mg | Freq: Once | INTRAMUSCULAR | Status: AC
  Administered 2021-06-20: 18:00:00 20 mg via INTRAVENOUS
  Filled 2021-06-20: qty 2

## 2021-06-20 MED ORDER — LIP MEDEX EX OINT
TOPICAL_OINTMENT | CUTANEOUS | Status: AC
  Filled 2021-06-20: qty 7

## 2021-06-20 MED ORDER — SODIUM PHOSPHATES 45 MMOLE/15ML IV SOLN
30.0000 mmol | Freq: Once | INTRAVENOUS | Status: AC
  Administered 2021-06-20: 16:00:00 30 mmol via INTRAVENOUS
  Filled 2021-06-20: qty 10

## 2021-06-20 NOTE — Care Management Important Message (Signed)
Important Message  Patient Details IM Letter placed in Patients room. Name: Stephanie Cortez MRN: [id number] Date of Birth: 07-25-1933   Medicare Important Message Given:  Yes     Kerin Salen 06/20/2021, 12:00 PM

## 2021-06-20 NOTE — Progress Notes (Addendum)
PROGRESS NOTE  Stephanie Cortez NIO:[id number] DOB: 04/27/1934 DOA: 06/17/2021 PCP: [name], MD  HPI/Recap of past 24 hours: Stephanie Cortez is a 87 y.o. female with medical history significant for chronic atrial fibrillation on Eliquis, CHF with preserved EF, COPD, hyperlipidemia, chronic hyponatremia, acquired hypothyroidism, who presented to [facility] ED from home with complaints of progressively worsening shortness of breath.  Associated with new onset productive cough.  Work up revealed covid-19 viral pneumonia with concern for coinfection with bacterial pneumonia.  Started on antiviral Remdesivir, IV steroids, bronchodialators.  IV Rocephin and azithromycin were added.  She was weaned off oxygen supplementation on 06/19/2021, passed her home oxygen evaluation.  06/20/2021: Seen at her bedside.  No acute events overnight.  Weak appearing with mild conversational dyspnea.  Continue IV Solu-Medrol for now and wean off as tolerated.    Assessment/Plan: Principal Problem:   COVID-19 virus infection Active Problems:   A-fib (HCC)   Hypothyroid   Hyponatremia   Chronic anticoagulation   Chronic obstructive pulmonary disease (HCC)   SOB (shortness of breath)   Acute respiratory failure with hypoxia (HCC)   Severe sepsis (HCC)   Chronic diastolic CHF (congestive heart failure) (HCC)   Chronic anemia  COVID-19 viral pneumonia, with concern for coinfection with bacterial pneumonia, POA. Presented with worsening dyspnea x3 days.  Associated with a productive cough. Personally reviewed chest x-ray done on admission which shows multifocal infiltrates suggestive of pneumonia. COVID-19 positive on 06/17/2021 Started on IV remdesivir on 06/17/2021 D5/5, complete course. Started on IV Solu-Medrol on 06/17/2021, continue Start Rocephin and azithromycin and Tessalon Perles, continue. Received a dose of IV Lasix 20 mg x 1 on 06/20/2021. Continue bronchodilators and pulmonary toilet Continue  incentive spirometer Continue flutter valve Continue to mobilize as tolerated  Resolved acute on chronic hypoxic respiratory failure secondary to COVID-19 viral pneumonia likely superimposed by bacterial pneumonia. On 2 L nasal cannula nightly at baseline Currently O2 saturation 94% on room air at rest. She passed her home oxygen evaluation on 06/19/2021.  Acute on chronic hyponatremia, improving. TSH normal 2.343 Serum sodium 129 from 128 from 126  Encourage increase in oral solute intake. We will hold off IV fluid to avoid volume overload and pulmonary edema. Continue salt tablet 1 g 3 times daily Repeat BMP  Chronic A. fib on Eliquis Rate is currently controlled, not on AV nodal blocker at this time. Continue Eliquis for CVA prevention  Chronic diastolic CHF Last 2D echo done on 10/28/2019 revealed LVEF 60 to 65%, left ventricle has no regional wall motion abnormalities BNP 129 on 06/18/2021 Continue home p.o. Lasix 20 mg daily Continue strict I's and O's and daily weight  Hypothyroidism Continue home levothyroxine  Hyperlipidemia Continue home pravastatin  Physical debility PT OT recommended SNF, patient is agreeable. Daughter prefers [facility] since the patient's husband is there. Continue fall precautions    Code Status: Full code  Family Communication: Updated her daughter via phone on 06/20/2021.  Disposition Plan: Likely will discharge to SNF, [facility], their First preference.   Consultants: None  Procedures: None  Antimicrobials: Rocephin 06/18/2021>> Azithromycin 06/18/2021>>>  DVT prophylaxis: Eliquis  Status is: Inpatient  Patient requires at least 2 midnights for further evaluation and treatment of present condition.      Objective: Vitals:   06/20/21 0559 06/20/21 0831 06/20/21 0900 06/20/21 1400  BP: (!) 152/86 (!) 152/88  127/71  Pulse: 94 (!) 107  91  Resp: 20 20 20 18   Temp: 98.2 F (  36.8 C) (!) 97.4 F (36.3 C)  (!)  97.4 F (36.3 C)  TempSrc:  Oral  Oral  SpO2: 95% 95%  94%  Weight:      Height:        Intake/Output Summary (Last 24 hours) at 06/20/2021 1702 Last data filed at 06/20/2021 0846 Gross per 24 hour  Intake --  Output 1650 ml  Net -1650 ml   Filed Weights   06/18/21 2026 06/19/21 0456 06/20/21 0429  Weight: 63.6 kg 63 kg 64.5 kg    Exam:  General: 87 y.o. year-old female well-developed well-nourished in no acute distress.  She is alert and oriented x3.   Cardiovascular: Regular rate and rhythm no rubs or gallops.   Respiratory: Clear to auscultation no wheezes or rales.   Abdomen: Soft nontender normal bowel sounds present.   Musculoskeletal: Trace lower extremity edema bilaterally. Skin: No ulcerative lesions noted. Psychiatry: Mood is appropriate for condition and setting.   Data Reviewed: CBC: Recent Labs  Lab 06/17/21 1603 06/18/21 0440 06/19/21 0605 06/20/21 0357  WBC 3.5* 3.4* 6.4 11.4*  NEUTROABS  --  2.8  --   --   HGB 11.0* 10.5* 11.0* 11.3*  HCT 34.9* 33.8* 34.9* 35.8*  MCV 81.7 82.8 81.0 81.2  PLT 224 230 219 867   Basic Metabolic Panel: Recent Labs  Lab 06/17/21 1603 06/17/21 2343 06/17/21 2357 06/18/21 0440 06/19/21 0605 06/20/21 0357  NA 124*  --  128* 126* 128* 129*  K 3.9  --  3.6 4.4 3.8 3.8  CL 90*  --  93* 93* 93* 94*  CO2 26  --  26 27 27 26   GLUCOSE 129*  --  85 125* 199* 148*  BUN 9  --  8 7* 15 16  CREATININE 0.50  --  0.61 0.54 0.45 0.52  CALCIUM 8.2*  --  8.0* 8.0* 8.4* 8.5*  MG  --  1.7  --  1.8 1.9  --   PHOS  --   --   --  3.3 1.8*  --    GFR: Estimated Creatinine Clearance: 50 mL/min (by C-G formula based on SCr of 0.52 mg/dL). Liver Function Tests: Recent Labs  Lab 06/18/21 0440 06/19/21 0605 06/20/21 0357  AST 53* 33 29  ALT 17 18 18   ALKPHOS 116 108 100  BILITOT 1.4* 0.6 0.7  PROT 6.7 6.3* 6.6  ALBUMIN 2.7* 2.4* 2.8*   No results for input(s): LIPASE, AMYLASE in the last 168 hours. No results for  input(s): AMMONIA in the last 168 hours. Coagulation Profile: No results for input(s): INR, PROTIME in the last 168 hours. Cardiac Enzymes: No results for input(s): CKTOTAL, CKMB, CKMBINDEX, TROPONINI in the last 168 hours. BNP (last 3 results) No results for input(s): PROBNP in the last 8760 hours. HbA1C: No results for input(s): HGBA1C in the last 72 hours. CBG: No results for input(s): GLUCAP in the last 168 hours. Lipid Profile: No results for input(s): CHOL, HDL, LDLCALC, TRIG, CHOLHDL, LDLDIRECT in the last 72 hours. Thyroid Function Tests: Recent Labs    06/18/21 0040  TSH 2.343   Anemia Panel: No results for input(s): VITAMINB12, FOLATE, FERRITIN, TIBC, IRON, RETICCTPCT in the last 72 hours. Urine analysis:    Component Value Date/Time   COLORURINE YELLOW 06/17/2021 [place] 06/17/2021 1655   LABSPEC 1.010 06/17/2021 1655   PHURINE 7.0 06/17/2021 1655   GLUCOSEU NEGATIVE 06/17/2021 1655   HGBUR NEGATIVE 06/17/2021 [place] 06/17/2021  [place] 02/06/2012 1022   [place] 06/17/2021 1655   PROTEINUR 30 (A) 06/17/2021 1655   UROBILINOGEN 0.2 04/07/2012 1443   NITRITE NEGATIVE 06/17/2021 1655   LEUKOCYTESUR TRACE (A) 06/17/2021 1655   Sepsis Labs: @LABRCNTIP (procalcitonin:4,lacticidven:4)  ) Recent Results (from the past 240 hour(s))  Resp Panel by RT-PCR (Flu A&B, Covid) Nasopharyngeal Swab     Status: Abnormal   Collection Time: 06/17/21  4:03 PM   Specimen: Nasopharyngeal Swab; Nasopharyngeal(NP) swabs in vial transport medium  Result Value Ref Range Status   SARS Coronavirus 2 by RT PCR POSITIVE (A) NEGATIVE Final    Comment: (NOTE) SARS-CoV-2 target nucleic acids are DETECTED.  The SARS-CoV-2 RNA is generally detectable in upper respiratory specimens during the acute phase of infection. Positive results are indicative of the presence of the identified virus, but do not rule out bacterial  infection or co-infection with other pathogens not detected by the test. Clinical correlation with patient history and other diagnostic information is necessary to determine patient infection status. The expected result is Negative.  Fact Sheet for Patients: EntrepreneurPulse.com.au  Fact Sheet for Healthcare Providers: IncredibleEmployment.be  This test is not yet approved or cleared by the [place] FDA and  has been authorized for detection and/or diagnosis of SARS-CoV-2 by FDA under an Emergency Use Authorization (EUA).  This EUA will remain in effect (meaning this test can be used) for the duration of  the COVID-19 declaration under Section 564(b)(1) of the A ct, 21 U.S.C. section 360bbb-3(b)(1), unless the authorization is terminated or revoked sooner.     Influenza A by PCR NEGATIVE NEGATIVE Final   Influenza B by PCR NEGATIVE NEGATIVE Final    Comment: (NOTE) The Xpert Xpress SARS-CoV-2/FLU/RSV plus assay is intended as an aid in the diagnosis of influenza from Nasopharyngeal swab specimens and should not be used as a sole basis for treatment. Nasal washings and aspirates are unacceptable for Xpert Xpress SARS-CoV-2/FLU/RSV testing.  Fact Sheet for Patients: EntrepreneurPulse.com.au  Fact Sheet for Healthcare Providers: IncredibleEmployment.be  This test is not yet approved or cleared by the [place] FDA and has been authorized for detection and/or diagnosis of SARS-CoV-2 by FDA under an Emergency Use Authorization (EUA). This EUA will remain in effect (meaning this test can be used) for the duration of the COVID-19 declaration under Section 564(b)(1) of the Act, 21 U.S.C. section 360bbb-3(b)(1), unless the authorization is terminated or revoked.  Performed at [facility], [place] [address], [place], [place] [address]   Culture, blood (Routine X 2) w Reflex to ID Panel      Status: None (Preliminary result)   Collection Time: 06/18/21  1:20 AM   Specimen: BLOOD  Result Value Ref Range Status   Specimen Description   Final    BLOOD RIGHT ANTECUBITAL Performed at [facility] [address] [place], [place] [address]    Special Requests   Final    BOTTLES DRAWN AEROBIC AND ANAEROBIC Blood Culture adequate volume Performed at [facility] [address] [place], [place] [address]    Culture   Final    NO GROWTH 2 DAYS Performed at [facility] [address] [place], [place] [address]    Report Status PENDING  Incomplete  Culture, blood (Routine X 2) w Reflex to ID Panel     Status: None (Preliminary result)   Collection Time: 06/18/21  1:30 AM   Specimen: BLOOD  Result Value Ref  Range Status   Specimen Description   Final    BLOOD BLOOD LEFT FOREARM Performed at [facility] [address], [place], [place] [address]    Special Requests   Final    BOTTLES DRAWN AEROBIC AND ANAEROBIC Blood Culture adequate volume Performed at [facility] [address] [place], [place] [address]    Culture   Final    NO GROWTH 2 DAYS Performed at [facility] [address] [place], [place] [address]    Report Status PENDING  Incomplete      Studies: No results found.  Scheduled Meds:  apixaban  2.5 mg Oral BID   benzonatate  200 mg Oral TID   furosemide  20 mg Oral Daily   Ipratropium-Albuterol  1 puff Inhalation Q6H WA   levothyroxine  50 mcg Oral Daily   methylPREDNISolone (SOLU-MEDROL) injection  0.5 mg/kg Intravenous Q12H   Followed by   Derrill Memo ON 06/21/2021] predniSONE  50 mg Oral Daily   pravastatin  40 mg Oral QHS   sodium chloride  1 g Oral TID WC    Continuous Infusions:  azithromycin 500 mg (06/20/21 1058)   cefTRIAXone (ROCEPHIN)  IV 1 g (06/20/21 1054)   remdesivir 100 mg in NS 100 mL 100 mg (06/20/21 0858)   sodium phosphate   Dextrose 5% IVPB 30 mmol (06/20/21 1621)     LOS: 3 days     Kayleen Memos, MD [facility] Pager [phone]  If 7PM-7AM, please contact night-coverage [email or web address] Password [email or web address] 06/20/2021, 5:02 PM

## 2021-06-20 NOTE — NC FL2 (Signed)
[place] MEDICAID FL2 LEVEL OF CARE SCREENING TOOL     IDENTIFICATION  Patient Name: Stephanie Cortez Birthdate: Jun 02, 1934 Sex: female Admission Date (Current Location): 06/17/2021  [facility] and [place] Number:  Herbalist and Address:  [facility],  [place], [place]      Provider Number: [id number]  Attending Physician Name and Address:  Kayleen Memos, DO  Relative Name and Phone Number:       Current Level of Care: Hospital Recommended Level of Care: [place] Prior Approval Number:    Date Approved/Denied:   PASRR Number: [id number]  Discharge Plan: SNF    Current Diagnoses: Patient Active Problem List   Diagnosis Date Noted   SOB (shortness of breath) 06/18/2021   Acute respiratory failure with hypoxia ([place]) 06/18/2021   Severe sepsis ([place]) 06/18/2021   Chronic diastolic CHF (congestive heart failure) ([place]) 06/18/2021   Chronic anemia 06/18/2021   COVID-19 virus infection 06/17/2021   Chronic pain of right knee 06/20/2020   Abnormality of gait 06/20/2020   Malnutrition of moderate degree 02/25/2020   S/P percutaneous endoscopic gastrostomy (PEG) tube placement ([place])    Post-operative pain    Subtherapeutic international normalized ratio (INR)    Debility 02/17/2020   Mesenteric ischemia ([place])    Sepsis ([place]) 02/08/2020   Small bowel ischemia ([place]) 02/08/2020   Thrombocytosis 02/08/2020   Abnormal CT of the abdomen    Periprosthetic fracture around internal prosthetic hip joint 11/16/2019   Periprosthetic fracture around internal prosthetic hip joint, initial encounter    Chronic obstructive pulmonary disease ([place])    Essential hypertension    Chronic anticoagulation    Acute blood loss anemia    Transaminitis    Hypoalbuminemia due to protein-calorie malnutrition ([place])    Labile blood pressure    Drug induced constipation    Postoperative pain    Subcapital fracture of femur ([place]) 10/30/2019    Post-operative state    Prerenal azotemia    Leukocytosis    Closed right hip fracture, initial encounter ([place]) 10/27/2019   Hoarseness 02/19/2018   Sensorineural hearing loss (SNHL) of both ears 02/19/2018   Tinnitus, bilateral 02/19/2018   Cough 09/06/2017   Cough variant asthma 08/19/2017   Acute bronchitis 02/20/2016   Pulmonary air trapping 01/04/2016   Encounter for preoperative pulmonary examination 01/04/2016   NSIP (nonspecific interstitial pneumonia) ([place]) 09/26/2015   Chronic sinusitis 09/26/2015   ILD (interstitial lung disease) ([place]) 07/12/2014   Upper airway cough syndrome 09/23/2013   Encounter for therapeutic drug monitoring 08/11/2013   Congestive heart failure ([place]) 07/08/2013   Coronary artery calcification seen on CAT scan 03/15/2013   Nodule of left lung 03/15/2013   Orthostatic lightheadedness 02/09/2013   Depression    Hyponatremia 04/03/2012   Diverticulitis 01/01/2012   GERD (gastroesophageal reflux disease) 01/01/2012   Hypothyroid 11/23/2010   Edema 10/04/2010   Bronchiectasis ([place])    HYPERTENSION, BENIGN 10/22/2008   A-fib (HCC)     Orientation RESPIRATION BLADDER Height & Weight     Self, Time, Situation, Place  Normal Continent Weight: 64.5 kg Height:  5\' 7"  (170.2 cm)  BEHAVIORAL SYMPTOMS/MOOD NEUROLOGICAL BOWEL NUTRITION STATUS      Continent Diet (regular)  AMBULATORY STATUS COMMUNICATION OF NEEDS Skin   Extensive Assist Verbally Normal                       Personal Care Assistance Level of Assistance  Bathing,  Feeding, Dressing Bathing Assistance: Independent Feeding assistance: Independent Dressing Assistance: Independent     Functional Limitations Info  Sight, Hearing, Speech Sight Info: Adequate Hearing Info: Adequate Speech Info: Adequate    SPECIAL CARE FACTORS FREQUENCY  PT (By licensed PT), OT (By licensed OT)     PT Frequency: 5 x weekly OT Frequency: 5 x weekly            Contractures Contractures  Info: Not present    Additional Factors Info  Code Status Code Status Info: full             Current Medications (06/20/2021):  This is the current hospital active medication list Current Facility-Administered Medications  Medication Dose Route Frequency Provider Last Rate Last Admin   acetaminophen (TYLENOL) tablet 650 mg  650 mg Oral Q6H PRN Howerter, Justin B, DO   650 mg at 06/19/21 2247   Or   acetaminophen (TYLENOL) suppository 650 mg  650 mg Rectal Q6H PRN Howerter, Justin B, DO       albuterol (VENTOLIN HFA) 108 (90 Base) MCG/ACT inhaler 1-2 puff  1-2 puff Inhalation Q4H PRN Howerter, Justin B, DO   2 puff at 06/18/21 1002   apixaban (ELIQUIS) tablet 2.5 mg  2.5 mg Oral BID Howerter, Justin B, DO   2.5 mg at 06/19/21 2247   azithromycin (ZITHROMAX) 500 mg in sodium chloride 0.9 % 250 mL IVPB  500 mg Intravenous Daily Irene Pap N, DO 250 mL/hr at 06/19/21 1110 500 mg at 06/19/21 1110   benzonatate (TESSALON) capsule 200 mg  200 mg Oral TID Irene Pap N, DO   200 mg at 06/19/21 2247   cefTRIAXone (ROCEPHIN) 1 g in sodium chloride 0.9 % 100 mL IVPB  1 g Intravenous Daily Irene Pap N, DO 200 mL/hr at 06/19/21 1012 1 g at 06/19/21 1012   furosemide (LASIX) tablet 20 mg  20 mg Oral Daily Howerter, Justin B, DO   20 mg at 06/19/21 6226   hydrOXYzine (ATARAX) tablet 12.5 mg  12.5 mg Oral Q8H PRN Howerter, Justin B, DO   12.5 mg at 06/20/21 0406   Ipratropium-Albuterol (COMBIVENT) respimat 1 puff  1 puff Inhalation Q6H WA Howerter, Justin B, DO   1 puff at 06/19/21 2258   levothyroxine (SYNTHROID) tablet 50 mcg  50 mcg Oral Daily Howerter, Justin B, DO   50 mcg at 06/19/21 3335   methylPREDNISolone sodium succinate (SOLU-MEDROL) 40 mg/mL injection 36.4 mg  0.5 mg/kg Intravenous Q12H Howerter, Justin B, DO   36.4 mg at 06/20/21 4562   Followed by   Derrill Memo ON 06/21/2021] predniSONE (DELTASONE) tablet 50 mg  50 mg Oral Daily Howerter, Justin B, DO       pravastatin (PRAVACHOL) tablet  40 mg  40 mg Oral QHS Howerter, Justin B, DO   40 mg at 06/19/21 2247   remdesivir 100 mg in sodium chloride 0.9 % 100 mL IVPB  100 mg Intravenous Daily [place] Gambler, MD 200 mL/hr at 06/19/21 0937 100 mg at 06/19/21 0937   sodium chloride tablet 1 g  1 g Oral TID WC Hall, Carole N, DO   1 g at 06/19/21 1816     Discharge Medications: Please see discharge summary for a list of discharge medications.  Relevant Imaging Results:  Relevant Lab Results:   Additional Information BWL:[id number]  Leeroy Cha, RN

## 2021-06-20 NOTE — TOC Initial Note (Addendum)
Transition of Care [facility]) - Initial/Assessment Note    Patient Details  Name: Stephanie Cortez MRN: [id number] Date of Birth: 1933/09/01  Transition of Care [facility]) CM/SW Contact:    Leeroy Cha, RN Phone Number: 06/20/2021, 8:41 AM  Clinical Narrative:                 Will follow for progression and possible snf placement if needed. [place]ar number is [phone] A Fl2 to be completed. Fl2 completed and saent out to all area snfs for patient choosing. Tct-duaghter given three choice of snf that have made bed offers.  Wants patient to go [facility] husband is there. Tct-tracey [place]-unable to leave message voice mail is full. Tct-tracey on mobile message left to please call back.   Expected Discharge Plan: Home w Hospice Care Barriers to Discharge: Continued Medical Work up   Patient Goals and CMS Choice Patient states their goals for this hospitalization and ongoing recovery are:: to go home if possible CMS Medicare.gov Compare Post Acute Care list provided to:: Patient Choice offered to / list presented to : Patient  Expected Discharge Plan and Services Expected Discharge Plan: Home w Hospice Care   Discharge Planning Services: CM Consult Post Acute Care Choice: [place]                                        Prior Living Arrangements/Services                    Criminal Activity/Legal Involvement Pertinent to Current Situation/Hospitalization: No - Comment as needed  Activities of Daily Living      Permission Sought/Granted                  Emotional Assessment Appearance:: Appears stated age Attitude/Demeanor/Rapport: Engaged Affect (typically observed): Calm Orientation: : Oriented to  Time, Oriented to Place, Oriented to Self, Oriented to Situation Alcohol / Substance Use: Never Used Psych Involvement: No (comment)  Admission diagnosis:  Acute respiratory failure with hypoxia ([place]) [J96.01] COVID-19 virus infection  [U07.1] COVID-19 [U07.1] Patient Active Problem List   Diagnosis Date Noted   SOB (shortness of breath) 06/18/2021   Acute respiratory failure with hypoxia ([place]) 06/18/2021   Severe sepsis ([place]) 06/18/2021   Chronic diastolic CHF (congestive heart failure) ([place]) 06/18/2021   Chronic anemia 06/18/2021   COVID-19 virus infection 06/17/2021   Chronic pain of right knee 06/20/2020   Abnormality of gait 06/20/2020   Malnutrition of moderate degree 02/25/2020   S/P percutaneous endoscopic gastrostomy (PEG) tube placement ([place])    Post-operative pain    Subtherapeutic international normalized ratio (INR)    Debility 02/17/2020   Mesenteric ischemia ([place])    Sepsis ([place]) 02/08/2020   Small bowel ischemia ([place]) 02/08/2020   Thrombocytosis 02/08/2020   Abnormal CT of the abdomen    Periprosthetic fracture around internal prosthetic hip joint 11/16/2019   Periprosthetic fracture around internal prosthetic hip joint, initial encounter    Chronic obstructive pulmonary disease ([place])    Essential hypertension    Chronic anticoagulation    Acute blood loss anemia    Transaminitis    Hypoalbuminemia due to protein-calorie malnutrition ([place])    Labile blood pressure    Drug induced constipation    Postoperative pain    Subcapital fracture of femur ([place]) 10/30/2019   Post-operative state    Prerenal azotemia    Leukocytosis  Closed right hip fracture, initial encounter ([place]) 10/27/2019   Hoarseness 02/19/2018   Sensorineural hearing loss (SNHL) of both ears 02/19/2018   Tinnitus, bilateral 02/19/2018   Cough 09/06/2017   Cough variant asthma 08/19/2017   Acute bronchitis 02/20/2016   Pulmonary air trapping 01/04/2016   Encounter for preoperative pulmonary examination 01/04/2016   NSIP (nonspecific interstitial pneumonia) ([place]) 09/26/2015   Chronic sinusitis 09/26/2015   ILD (interstitial lung disease) ([place]) 07/12/2014   Upper airway cough syndrome 09/23/2013   Encounter for  therapeutic drug monitoring 08/11/2013   Congestive heart failure ([place]) 07/08/2013   Coronary artery calcification seen on CAT scan 03/15/2013   Nodule of left lung 03/15/2013   Orthostatic lightheadedness 02/09/2013   Depression    Hyponatremia 04/03/2012   Diverticulitis 01/01/2012   GERD (gastroesophageal reflux disease) 01/01/2012   Hypothyroid 11/23/2010   Edema 10/04/2010   Bronchiectasis ([place])    HYPERTENSION, BENIGN 10/22/2008   A-fib ([place])    PCP:  [name], MD Pharmacy:   [facility], [place] [address] Phone: [phone] Fax: [phone]     Social Determinants of Health (SDOH) Interventions    Readmission Risk Interventions Readmission Risk Prevention Plan 02/10/2020  Transportation Screening Complete  HRI or Home Care Consult Complete  Social Work Consult for [place] Planning/Counseling Complete  Palliative Care Screening Not Applicable  Medication Review Press photographer) Complete  Some recent data might be hidden

## 2021-06-20 NOTE — TOC Progression Note (Signed)
Transition of Care [facility]) - Progression Note    Patient Details  Name: Stephanie Cortez MRN: [id number] Date of Birth: 15-Apr-1934  Transition of Care [facility]) CM/SW Contact  Leeroy Cha, RN Phone Number: 06/20/2021, 3:23 PM  Clinical Narrative:    Cresenciano Genre to be in quarantine for 10days and come on the eleventh.  06/28/21 can go to [facility].   Expected Discharge Plan: Home w Hospice Care Barriers to Discharge: Continued Medical Work up  Expected Discharge Plan and Services Expected Discharge Plan: [place]   Discharge Planning Services: CM Consult Post Acute Care Choice: [place]                                         Social Determinants of Health (SDOH) Interventions    Readmission Risk Interventions Readmission Risk Prevention Plan 02/10/2020  Transportation Screening Complete  HRI or Home Care Consult Complete  Social Work Consult for [place] Planning/Counseling Complete  Palliative Care Screening Not Applicable  Medication Review Press photographer) Complete  Some recent data might be hidden

## 2021-06-21 DIAGNOSIS — L8990 Pressure ulcer of unspecified site, unspecified stage: Secondary | ICD-10-CM | POA: Insufficient documentation

## 2021-06-21 DIAGNOSIS — R5381 Other malaise: Secondary | ICD-10-CM

## 2021-06-21 DIAGNOSIS — J9621 Acute and chronic respiratory failure with hypoxia: Secondary | ICD-10-CM

## 2021-06-21 LAB — COMPREHENSIVE METABOLIC PANEL
ALT: 17 U/L (ref 0–44)
AST: 26 U/L (ref 15–41)
Albumin: 2.6 g/dL — ABNORMAL LOW (ref 3.5–5.0)
Alkaline Phosphatase: 103 U/L (ref 38–126)
Anion gap: 8 (ref 5–15)
BUN: 17 mg/dL (ref 8–23)
CO2: 29 mmol/L (ref 22–32)
Calcium: 8.2 mg/dL — ABNORMAL LOW (ref 8.9–10.3)
Chloride: 95 mmol/L — ABNORMAL LOW (ref 98–111)
Creatinine, Ser: 0.5 mg/dL (ref 0.44–1.00)
GFR, Estimated: >60 mL/min (ref >60)
Glucose, Bld: 145 mg/dL — ABNORMAL HIGH (ref 70–99)
Potassium: 3.2 mmol/L — ABNORMAL LOW (ref 3.5–5.1)
Sodium: 132 mmol/L — ABNORMAL LOW (ref 135–145)
Total Bilirubin: 0.7 mg/dL (ref 0.3–1.2)
Total Protein: 6.2 g/dL — ABNORMAL LOW (ref 6.5–8.1)

## 2021-06-21 LAB — D-DIMER, QUANTITATIVE: D-Dimer, Quant: 0.69 ug/mL-FEU — ABNORMAL HIGH (ref 0.00–0.50)

## 2021-06-21 LAB — CBC
HCT: 35.5 % — ABNORMAL LOW (ref 36.0–46.0)
Hemoglobin: 11.3 g/dL — ABNORMAL LOW (ref 12.0–15.0)
MCH: 25.7 pg — ABNORMAL LOW (ref 26.0–34.0)
MCHC: 31.8 g/dL (ref 30.0–36.0)
MCV: 80.9 fL (ref 80.0–100.0)
Platelets: 265 10*3/uL (ref 150–400)
RBC: 4.39 MIL/uL (ref 3.87–5.11)
RDW: 17.2 % — ABNORMAL HIGH (ref 11.5–15.5)
WBC: 7.9 10*3/uL (ref 4.0–10.5)
nRBC: 0 % (ref 0.0–0.2)

## 2021-06-21 LAB — PHOSPHORUS: Phosphorus: 3.3 mg/dL (ref 2.5–4.6)

## 2021-06-21 LAB — C-REACTIVE PROTEIN: CRP: 1.6 mg/dL — ABNORMAL HIGH (ref <1.0)

## 2021-06-21 LAB — MAGNESIUM: Magnesium: 1.9 mg/dL (ref 1.7–2.4)

## 2021-06-21 MED ORDER — POTASSIUM CHLORIDE CRYS ER 20 MEQ PO TBCR
40.0000 meq | EXTENDED_RELEASE_TABLET | Freq: Once | ORAL | Status: AC
  Administered 2021-06-21: 09:00:00 40 meq via ORAL
  Filled 2021-06-21: qty 2

## 2021-06-21 NOTE — Assessment & Plan Note (Addendum)
-   from poor oral intake on admission

## 2021-06-21 NOTE — Assessment & Plan Note (Addendum)
-   evaluated by PT/OT - plan is for d/c to [facility] on 12/28; patient is aware

## 2021-06-21 NOTE — Progress Notes (Signed)
Physical Therapy Treatment Patient Details Name: Stephanie Cortez MRN: [id number] DOB: [birth year] Today's Date: 06/21/2021   History of Present Illness 87 y.o. female admitted with COVID Pna, sepsis, after falling at home.  history significant for chronic atrial fibrillation on Eliquis, CHF with preserved EF, COPD, hyperlipidemia, chronic hyponatremia, acquired hypothyroidism, who presented to [facility] ED from home with complaints of progressively worsening shortness of breath.    PT Comments    Pt continues to participate well. She remains weak and at risk for falls 2* poor balance. Will continue to follow and progress activity as tolerated.    Recommendations for follow up therapy are one component of a multi-disciplinary discharge planning process, led by the attending physician.  Recommendations may be updated based on patient status, additional functional criteria and insurance authorization.  Follow Up Recommendations  Skilled nursing-short term rehab (<3 hours/day)     Assistance Recommended at Discharge Frequent or constant Supervision/Assistance  Equipment Recommendations  None recommended by PT    Recommendations for Other Services       Precautions / Restrictions Precautions Precautions: Fall Precaution Comments: monitor O2 Restrictions Weight Bearing Restrictions: No     Mobility  Bed Mobility Overal bed mobility: Needs Assistance Bed Mobility: Sit to Supine     Supine to sit: Mod assist;+2 for physical assistance;HOB elevated Sit to supine: Mod assist;+2 for physical assistance;HOB elevated   General bed mobility comments: Assist for trunk and LEs. Increased time. Cues provided.    Transfers Overall transfer level: Needs assistance Equipment used: Rolling walker (2 wheels) Transfers: Sit to/from Stand Sit to Stand: Mod assist;+2 physical assistance;+2 safety/equipment           General transfer comment: Assist for anterior weightshift, power up,  controlled descent. Cues for safety, technique, hand/LE placement.    Ambulation/Gait Ambulation/Gait assistance: Min assist;+2 safety/equipment Gait Distance (Feet): 10 Feet Assistive device: Rolling walker (2 wheels) Gait Pattern/deviations: Step-through pattern;Decreased stride length;Trunk flexed       General Gait Details: Assist to stabilize pt throughout distance and to maneuver RW. Fall risk.Dyspnea 2/4   Stairs             Wheelchair Mobility    Modified Rankin (Stroke Patients Only)       Balance Overall balance assessment: Needs assistance;History of Falls Sitting-balance support: Bilateral upper extremity supported Sitting balance-Leahy Scale: Poor   Postural control: Posterior lean Standing balance support: Bilateral upper extremity supported Standing balance-Leahy Scale: Poor Standing balance comment: reliant on external assist                            Cognition Arousal/Alertness: Awake/alert Behavior During Therapy: WFL for tasks assessed/performed Overall Cognitive Status: Within Functional Limits for tasks assessed                                 General Comments: for the most part, cognition appeared [facility]        Exercises      General Comments        Pertinent Vitals/Pain Pain Assessment: Faces Faces Pain Scale: No hurt    Home Living                          Prior Function            PT Goals (current goals can now be found in  the care plan section) Progress towards PT goals: Progressing toward goals    Frequency    Min 3X/week      PT Plan Current plan remains appropriate    Co-evaluation              AM-PAC PT "6 Clicks" Mobility   Outcome Measure  Help needed turning from your back to your side while in a flat bed without using bedrails?: A Lot Help needed moving from lying on your back to sitting on the side of a flat bed without using bedrails?: A Lot Help needed  moving to and from a bed to a chair (including a wheelchair)?: A Lot Help needed standing up from a chair using your arms (e.g., wheelchair or bedside chair)?: A Lot Help needed to walk in hospital room?: A Lot Help needed climbing 3-5 steps with a railing? : Total 6 Click Score: 11    End of Session Equipment Utilized During Treatment: Gait belt Activity Tolerance: Patient tolerated treatment well Patient left: in bed;with call bell/phone within reach;with bed alarm set   PT Visit Diagnosis: Muscle weakness (generalized) (M62.81);History of falling (Z91.81);Unsteadiness on feet (R26.81);Difficulty in walking, not elsewhere classified (R26.2)     Time: 8115-7262 PT Time Calculation (min) (ACUTE ONLY): 15 min  Charges:  $Gait Training: 8-22 mins                         Doreatha Massed, PT Acute Rehabilitation  Office: [phone] Pager: [phone]

## 2021-06-21 NOTE — Assessment & Plan Note (Addendum)
-   on 2L nocturnal O2 at home - currently on RA at rest

## 2021-06-21 NOTE — Assessment & Plan Note (Addendum)
-   given underlying lung disease patient is high risk for progression to severe disease - s/p remdesivir - 10 days steroid course completed on 12/27; respiratory status baseline - continue IS and flutter - d/c isolation precautions on 12/28; medically stable for d/c to [facility] on 12/28

## 2021-06-21 NOTE — Assessment & Plan Note (Signed)
-   Continue Eliquis 

## 2021-06-21 NOTE — Hospital Course (Addendum)
Stephanie Cortez is an 87 y.o. female with PMH chronic atrial fibrillation on Eliquis, CHF with preserved EF, COPD, HLD, chronic hyponatremia, acquired hypothyroidism, who presented to [facility] ED from home with complaints of progressively worsening shortness of breath with associated new onset productive cough.   Work up revealed covid-19 viral pneumonia with concern for coinfection with bacterial pneumonia.  Started on antiviral Remdesivir, IV steroids, bronchodialators.  IV Rocephin and azithromycin were added.  She was weaned off oxygen supplementation on 06/19/2021. See below for further A&P.

## 2021-06-21 NOTE — Assessment & Plan Note (Signed)
-   tachycardia, neutropenia, hypoxia and pulm source - see covid

## 2021-06-21 NOTE — Assessment & Plan Note (Signed)
-   Baseline hemoglobin around 10 to 11 g/dL - Currently at baseline 

## 2021-06-21 NOTE — Progress Notes (Signed)
° °  Progress Note   Date: 06/19/2021  Patient Name: Stephanie Cortez        MRN#: [id number]   Clarification of the diagnosis of pressure ulcer(s):       Multiple stage 1 pressure injuries including Pressure injury sacrum, left heel, all present on admission.

## 2021-06-21 NOTE — Progress Notes (Signed)
Progress Note    Stephanie Cortez   CVE:[id number]  DOB: 1933/10/27  DOA: 06/17/2021     4 PCP: [name], MD  Initial CC: SOB  Hospital Course: Stephanie Cortez is an 87 y.o. female with PMH chronic atrial fibrillation on Eliquis, CHF with preserved EF, COPD, HLD, chronic hyponatremia, acquired hypothyroidism, who presented to [facility] ED from home with complaints of progressively worsening shortness of breath with associated new onset productive cough.   Work up revealed covid-19 viral pneumonia with concern for coinfection with bacterial pneumonia.  Started on antiviral Remdesivir, IV steroids, bronchodialators.  IV Rocephin and azithromycin were added.  She was weaned off oxygen supplementation on 06/19/2021.  Interval History:  No events overnight.  She is slightly dyspneic during conversation but this is baseline for her she says.  Assessment & Plan: * COVID-19 virus infection - given underlying lung disease patient is high risk for progression to severe disease - s/p remdesivir - continue steroids to complete course - continue IS and flutter  - continue OOB with PT/OT - plan is for d/c to SNF after isolation period  Acute on chronic respiratory failure with hypoxia ([place]) - on 2L nocturnal O2 at home - currently on RA rest   Severe sepsis (HCC)-resolved as of 06/21/2021 - tachycardia, neutropenia, hypoxia and pulm source - see covid   Hyponatremia - possibly from poor oral intake on admission - has improved with ongoing better oral intake   Physical deconditioning - evaluated by PT/OT - plan is for SNF once isolation period expires; diagnosed 12/27 with Covid  Chronic diastolic CHF (congestive heart failure) (HCC) - no s/s exacerbation  -Continue Lasix  Chronic anemia - Baseline hemoglobin around 10 to 11 g/dL - Currently at baseline  Hypothyroid - Continue Synthroid  A-fib ([place]) - Continue Eliquis    Old records reviewed in assessment of this  patient  Antimicrobials: \Remdesivir 06/17/2021 >> 06/21/21  DVT prophylaxis: Eliquis  Code Status:   Code Status: Full Code  Disposition Plan:  SNF Status is: Inpt  Objective: Blood pressure 132/82, pulse 90, temperature (!) 97.2 F (36.2 C), temperature source Oral, resp. rate 18, height 5\' 7"  (1.702 m), weight 64.4 kg, SpO2 97 %.  Examination:  Physical Exam Constitutional:      Appearance: Normal appearance.  HENT:     Head: Normocephalic and atraumatic.     Mouth/Throat:     Mouth: Mucous membranes are moist.  Eyes:     Extraocular Movements: Extraocular movements intact.  Cardiovascular:     Pulses: Normal pulses.     Heart sounds: Normal heart sounds.  Pulmonary:     Comments: Dry crackles throughout; no wheezing  Abdominal:     General: Bowel sounds are normal. There is no distension.     Palpations: Abdomen is soft.     Tenderness: There is no abdominal tenderness.  Musculoskeletal:        General: Normal range of motion.     Cervical back: Normal range of motion and neck supple.  Skin:    General: Skin is warm and dry.  Neurological:     General: No focal deficit present.     Mental Status: She is alert and oriented to person, place, and time.  Psychiatric:        Mood and Affect: Mood normal.        Behavior: Behavior normal.     Consultants:    Procedures:    Data Reviewed: I have personally reviewed labs  and imaging studies    LOS: 4 days  Time spent: Greater than 50% of the 35 minute visit was spent in counseling/coordination of care for the patient as laid out in the A&P.   Dwyane Dee, MD [facility] 06/21/2021, 4:20 PM

## 2021-06-21 NOTE — Assessment & Plan Note (Signed)
Continue Synthroid °

## 2021-06-21 NOTE — Progress Notes (Signed)
Occupational Therapy Treatment Patient Details Name: Stephanie Cortez MRN: [id number] DOB: 09/17/1933 Today's Date: 06/21/2021   History of present illness 87 y.o. female admitted with COVID Pna, sepsis, after falling at home.  history significant for chronic atrial fibrillation on Eliquis, CHF with preserved EF, COPD, hyperlipidemia, chronic hyponatremia, acquired hypothyroidism, who presented to [facility] ED from home with complaints of progressively worsening shortness of breath.   OT comments  Treatment focused on functional mobility and standing ADL task. Patient mod x 2 to transfer to side of bed and exhibited a posterior lean at edge of bed. Required mod x 2 to stand and mod assist to take steps and stand at sink for grooming task. Continue to recommend short term rehab at discharge.     Recommendations for follow up therapy are one component of a multi-disciplinary discharge planning process, led by the attending physician.  Recommendations may be updated based on patient status, additional functional criteria and insurance authorization.    Follow Up Recommendations  Skilled nursing-short term rehab (<3 hours/day)    Assistance Recommended at Discharge Frequent or constant Supervision/Assistance  Equipment Recommendations  Other (comment) (TBD)    Recommendations for Other Services      Precautions / Restrictions Precautions Precautions: Fall Precaution Comments: monitor O2 Restrictions Weight Bearing Restrictions: No       Mobility Bed Mobility Overal bed mobility: Needs Assistance Bed Mobility: Supine to Sit     Supine to sit: Mod assist;+2 for physical assistance     General bed mobility comments: At edge of bed patient exhibiting some poersior leaning. Required mod x 2 to stand with RW    Transfers Overall transfer level: Needs assistance Equipment used: Rolling walker (2 wheels)   Sit to Stand: Mod assist           General transfer comment: Mod assist with  RW to take steps to sink. Needed assistance for standing balance and walker management.     Balance Overall balance assessment: Needs assistance;History of Falls Sitting-balance support: Bilateral upper extremity supported Sitting balance-Leahy Scale: Poor   Postural control: Posterior lean Standing balance support: Bilateral upper extremity supported Standing balance-Leahy Scale: Poor Standing balance comment: reliant on external assist                           ADL either performed or assessed with clinical judgement   ADL       Grooming: Min guard;Standing Grooming Details (indicate cue type and reason): Physical assistance to maintain balance at sink but patient able to wash hands and face w                                    Extremity/Trunk Assessment Upper Extremity Assessment Upper Extremity Assessment: Overall WFL for tasks assessed       Cervical / Trunk Assessment Cervical / Trunk Assessment: Kyphotic    Vision Baseline Vision/History: 1 Wears glasses Patient Visual Report: No change from baseline     Perception     Praxis      Cognition Arousal/Alertness: Awake/alert Behavior During Therapy: WFL for tasks assessed/performed                                   General Comments: for the most part, cognition appeared [facility]  Exercises     Shoulder Instructions       General Comments      Pertinent Vitals/ Pain       Pain Assessment: No/denies pain  Home Living                                          Prior Functioning/Environment              Frequency  Min 2X/week        Progress Toward Goals  OT Goals(current goals can now be found in the care plan section)  Progress towards OT goals: Progressing toward goals  Acute Rehab OT Goals Patient Stated Goal: to get stronger OT Goal Formulation: With patient Time For Goal Achievement: 07/03/21 Potential to Achieve  Goals: Good  Plan Discharge plan remains appropriate    Co-evaluation                 AM-PAC OT "6 Clicks" Daily Activity     Outcome Measure   Help from another person eating meals?: A Little Help from another person taking care of personal grooming?: A Little Help from another person toileting, which includes using toliet, bedpan, or urinal?: Total Help from another person bathing (including washing, rinsing, drying)?: A Lot Help from another person to put on and taking off regular upper body clothing?: A Lot Help from another person to put on and taking off regular lower body clothing?: Total 6 Click Score: 12    End of Session Equipment Utilized During Treatment: Rolling walker (2 wheels);Gait belt  OT Visit Diagnosis: Other abnormalities of gait and mobility (R26.89);History of falling (Z91.81);Muscle weakness (generalized) (M62.81);Other symptoms and signs involving cognitive function   Activity Tolerance Patient limited by fatigue   Patient Left with call bell/phone within reach;in chair;with chair alarm set   Nurse Communication Mobility status        Time: 1497-0263 OT Time Calculation (min): 18 min  Charges: OT General Charges $OT Visit: 1 Visit OT Treatments $Self Care/Home Management : 8-22 mins  Derl Barrow, OTR/L [place]  Office [phone] Pager: [place] 06/21/2021, 1:16 PM

## 2021-06-21 NOTE — Assessment & Plan Note (Signed)
-   no s/s exacerbation  -Continue Lasix

## 2021-06-22 DIAGNOSIS — J9621 Acute and chronic respiratory failure with hypoxia: Secondary | ICD-10-CM | POA: Diagnosis not present

## 2021-06-22 DIAGNOSIS — U071 COVID-19: Secondary | ICD-10-CM | POA: Diagnosis not present

## 2021-06-22 LAB — CBC WITH DIFFERENTIAL/PLATELET
Abs Immature Granulocytes: 0.09 10*3/uL — ABNORMAL HIGH (ref 0.00–0.07)
Basophils Absolute: 0 10*3/uL (ref 0.0–0.1)
Basophils Relative: 0 %
Eosinophils Absolute: 0 10*3/uL (ref 0.0–0.5)
Eosinophils Relative: 0 %
HCT: 40.6 % (ref 36.0–46.0)
Hemoglobin: 12.6 g/dL (ref 12.0–15.0)
Immature Granulocytes: 1 %
Lymphocytes Relative: 19 %
Lymphs Abs: 2.5 10*3/uL (ref 0.7–4.0)
MCH: 25.4 pg — ABNORMAL LOW (ref 26.0–34.0)
MCHC: 31 g/dL (ref 30.0–36.0)
MCV: 81.7 fL (ref 80.0–100.0)
Monocytes Absolute: 1.4 10*3/uL — ABNORMAL HIGH (ref 0.1–1.0)
Monocytes Relative: 11 %
Neutro Abs: 9.1 10*3/uL — ABNORMAL HIGH (ref 1.7–7.7)
Neutrophils Relative %: 69 %
Platelets: 300 10*3/uL (ref 150–400)
RBC: 4.97 MIL/uL (ref 3.87–5.11)
RDW: 17.2 % — ABNORMAL HIGH (ref 11.5–15.5)
WBC: 13.2 10*3/uL — ABNORMAL HIGH (ref 4.0–10.5)
nRBC: 0 % (ref 0.0–0.2)

## 2021-06-22 LAB — COMPREHENSIVE METABOLIC PANEL
ALT: 19 U/L (ref 0–44)
AST: 28 U/L (ref 15–41)
Albumin: 3.1 g/dL — ABNORMAL LOW (ref 3.5–5.0)
Alkaline Phosphatase: 109 U/L (ref 38–126)
Anion gap: 8 (ref 5–15)
BUN: 14 mg/dL (ref 8–23)
CO2: 25 mmol/L (ref 22–32)
Calcium: 8.3 mg/dL — ABNORMAL LOW (ref 8.9–10.3)
Chloride: 95 mmol/L — ABNORMAL LOW (ref 98–111)
Creatinine, Ser: 0.46 mg/dL (ref 0.44–1.00)
GFR, Estimated: >60 mL/min (ref >60)
Glucose, Bld: 118 mg/dL — ABNORMAL HIGH (ref 70–99)
Potassium: 3.7 mmol/L (ref 3.5–5.1)
Sodium: 128 mmol/L — ABNORMAL LOW (ref 135–145)
Total Bilirubin: 0.8 mg/dL (ref 0.3–1.2)
Total Protein: 7 g/dL (ref 6.5–8.1)

## 2021-06-22 LAB — C-REACTIVE PROTEIN: CRP: 1.2 mg/dL — ABNORMAL HIGH (ref <1.0)

## 2021-06-22 LAB — LACTATE DEHYDROGENASE: LDH: 223 U/L — ABNORMAL HIGH (ref 98–192)

## 2021-06-22 LAB — D-DIMER, QUANTITATIVE: D-Dimer, Quant: 0.95 ug/mL-FEU — ABNORMAL HIGH (ref 0.00–0.50)

## 2021-06-22 LAB — MAGNESIUM: Magnesium: 2 mg/dL (ref 1.7–2.4)

## 2021-06-22 MED ORDER — LORAZEPAM 2 MG/ML IJ SOLN: 0.2500 mg | Freq: Once | INTRAMUSCULAR | Status: DC

## 2021-06-22 MED ORDER — IPRATROPIUM-ALBUTEROL 20-100 MCG/ACT IN AERS
1.0000 | INHALATION_SPRAY | Freq: Three times a day (TID) | RESPIRATORY_TRACT | Status: DC
  Administered 2021-06-22 – 2021-06-28 (×19): 1 via RESPIRATORY_TRACT

## 2021-06-22 NOTE — Progress Notes (Signed)
Progress Note    Stephanie Cortez   GGE:[id number]  DOB: August 05, 1933  DOA: 06/17/2021     5 PCP: [name], MD  Initial CC: SOB  Hospital Course: Stephanie Cortez is an 87 y.o. female with PMH chronic atrial fibrillation on Eliquis, CHF with preserved EF, COPD, HLD, chronic hyponatremia, acquired hypothyroidism, who presented to [facility] ED from home with complaints of progressively worsening shortness of breath with associated new onset productive cough.   Work up revealed covid-19 viral pneumonia with concern for coinfection with bacterial pneumonia.  Started on antiviral Remdesivir, IV steroids, bronchodialators.  IV Rocephin and azithromycin were added.  She was weaned off oxygen supplementation on 06/19/2021.  Interval History:  No events overnight.  Dyspnea at baseline. Comfortable in bed.   Assessment & Plan: * COVID-19 virus infection- (present on admission) - given underlying lung disease patient is high risk for progression to severe disease - s/p remdesivir - continue steroids to complete course - continue IS and flutter  - continue OOB with PT/OT - plan is for d/c to SNF after isolation period  Acute on chronic respiratory failure with hypoxia ([place])- (present on admission) - on 2L nocturnal O2 at home - currently on RA rest   Severe sepsis (HCC)-resolved as of 06/21/2021, (present on admission) - tachycardia, neutropenia, hypoxia and pulm source - see covid   Hyponatremia- (present on admission) - possibly from poor oral intake on admission - has improved with ongoing better oral intake   Physical deconditioning - evaluated by PT/OT - plan is for SNF once isolation period expires; diagnosed 12/27 with Covid  Chronic diastolic CHF (congestive heart failure) ([place])- (present on admission) - no s/s exacerbation  -Continue Lasix  Chronic anemia- (present on admission) - Baseline hemoglobin around 10 to 11 g/dL - Currently at baseline  Hypothyroid- (present on  admission) - Continue Synthroid  A-fib ([place])- (present on admission) - Continue Eliquis    Old records reviewed in assessment of this patient  Antimicrobials: Remdesivir 06/17/2021 >> 06/21/21  DVT prophylaxis: Eliquis  Code Status:   Code Status: Full Code  Disposition Plan:  SNF Status is: Inpt  Objective: Blood pressure 121/72, pulse (!) 101, temperature (!) 97.5 F (36.4 C), temperature source Oral, resp. rate 19, height 5\' 7"  (1.702 m), weight 63.2 kg, SpO2 94 %.  Examination:  Physical Exam Constitutional:      Appearance: Normal appearance.  HENT:     Head: Normocephalic and atraumatic.     Mouth/Throat:     Mouth: Mucous membranes are moist.  Eyes:     Extraocular Movements: Extraocular movements intact.  Cardiovascular:     Pulses: Normal pulses.     Heart sounds: Normal heart sounds.  Pulmonary:     Comments: Dry crackles throughout; no wheezing  Abdominal:     General: Bowel sounds are normal. There is no distension.     Palpations: Abdomen is soft.     Tenderness: There is no abdominal tenderness.  Musculoskeletal:        General: Normal range of motion.     Cervical back: Normal range of motion and neck supple.  Skin:    General: Skin is warm and dry.  Neurological:     General: No focal deficit present.     Mental Status: She is alert and oriented to person, place, and time.  Psychiatric:        Mood and Affect: Mood normal.        Behavior: Behavior normal.  Consultants:    Procedures:    Data Reviewed: I have personally reviewed labs and imaging studies    LOS: 5 days  Time spent: Greater than 50% of the 35 minute visit was spent in counseling/coordination of care for the patient as laid out in the A&P.   Dwyane Dee, MD [facility] 06/22/2021, 1:47 PM

## 2021-06-23 ENCOUNTER — Inpatient Hospital Stay (HOSPITAL_COMMUNITY): Payer: Medicare Other

## 2021-06-23 DIAGNOSIS — R5381 Other malaise: Secondary | ICD-10-CM | POA: Diagnosis not present

## 2021-06-23 DIAGNOSIS — K229 Disease of esophagus, unspecified: Secondary | ICD-10-CM | POA: Diagnosis not present

## 2021-06-23 DIAGNOSIS — U071 COVID-19: Secondary | ICD-10-CM | POA: Diagnosis not present

## 2021-06-23 DIAGNOSIS — J9621 Acute and chronic respiratory failure with hypoxia: Secondary | ICD-10-CM | POA: Diagnosis not present

## 2021-06-23 LAB — COMPREHENSIVE METABOLIC PANEL
ALT: 15 U/L (ref 0–44)
AST: 18 U/L (ref 15–41)
Albumin: 2.5 g/dL — ABNORMAL LOW (ref 3.5–5.0)
Alkaline Phosphatase: 89 U/L (ref 38–126)
Anion gap: 9 (ref 5–15)
BUN: 13 mg/dL (ref 8–23)
CO2: 27 mmol/L (ref 22–32)
Calcium: 8.4 mg/dL — ABNORMAL LOW (ref 8.9–10.3)
Chloride: 96 mmol/L — ABNORMAL LOW (ref 98–111)
Creatinine, Ser: 0.43 mg/dL — ABNORMAL LOW (ref 0.44–1.00)
GFR, Estimated: >60 mL/min (ref >60)
Glucose, Bld: 82 mg/dL (ref 70–99)
Potassium: 3.2 mmol/L — ABNORMAL LOW (ref 3.5–5.1)
Sodium: 132 mmol/L — ABNORMAL LOW (ref 135–145)
Total Bilirubin: 0.7 mg/dL (ref 0.3–1.2)
Total Protein: 5.7 g/dL — ABNORMAL LOW (ref 6.5–8.1)

## 2021-06-23 LAB — CBC WITH DIFFERENTIAL/PLATELET
Abs Immature Granulocytes: 0.07 10*3/uL (ref 0.00–0.07)
Basophils Absolute: 0 10*3/uL (ref 0.0–0.1)
Basophils Relative: 0 %
Eosinophils Absolute: 0.2 10*3/uL (ref 0.0–0.5)
Eosinophils Relative: 3 %
HCT: 36.6 % (ref 36.0–46.0)
Hemoglobin: 11.5 g/dL — ABNORMAL LOW (ref 12.0–15.0)
Immature Granulocytes: 1 %
Lymphocytes Relative: 14 %
Lymphs Abs: 1.2 10*3/uL (ref 0.7–4.0)
MCH: 25.3 pg — ABNORMAL LOW (ref 26.0–34.0)
MCHC: 31.4 g/dL (ref 30.0–36.0)
MCV: 80.6 fL (ref 80.0–100.0)
Monocytes Absolute: 1.2 10*3/uL — ABNORMAL HIGH (ref 0.1–1.0)
Monocytes Relative: 14 %
Neutro Abs: 5.6 10*3/uL (ref 1.7–7.7)
Neutrophils Relative %: 68 %
Platelets: 284 10*3/uL (ref 150–400)
RBC: 4.54 MIL/uL (ref 3.87–5.11)
RDW: 17.4 % — ABNORMAL HIGH (ref 11.5–15.5)
WBC: 8.2 10*3/uL (ref 4.0–10.5)
nRBC: 0 % (ref 0.0–0.2)

## 2021-06-23 LAB — CULTURE, BLOOD (ROUTINE X 2)
Culture: NO GROWTH
Culture: NO GROWTH
Special Requests: ADEQUATE
Special Requests: ADEQUATE

## 2021-06-23 LAB — C-REACTIVE PROTEIN: CRP: 4 mg/dL — ABNORMAL HIGH (ref <1.0)

## 2021-06-23 LAB — D-DIMER, QUANTITATIVE: D-Dimer, Quant: 0.87 ug/mL-FEU — ABNORMAL HIGH (ref 0.00–0.50)

## 2021-06-23 LAB — LACTATE DEHYDROGENASE: LDH: 182 U/L (ref 98–192)

## 2021-06-23 LAB — MAGNESIUM: Magnesium: 1.8 mg/dL (ref 1.7–2.4)

## 2021-06-23 NOTE — Care Management Important Message (Signed)
Important Message  Patient Details IM Letter placed in Patients room. Name: Stephanie Cortez MRN: [id number] Date of Birth: [birth year]   Medicare Important Message Given:  Yes     Kerin Salen 06/23/2021, 1:54 PM

## 2021-06-23 NOTE — TOC Progression Note (Signed)
Transition of Care [facility]) - Progression Note    Patient Details  Name: Stephanie Cortez MRN: [id number] Date of Birth: August 18, 1933  [facility] CM/SW Contact  Joaquin Courts, RN Phone Number: 06/23/2021, 1:23 PM  Clinical Narrative:    CM spoke with Olivia Mackie at [facility], Per Olivia Mackie currently no open beds in facility.  Olivia Mackie states that if a bed came available patient would also need to be 10 days post positive test, so would not be eligible for admission until day 11 (06/28/21).  CM also outreached to the three facilities who have made bed offers, VM left for [facility] to clarify earliest possible admit date due to + covid diagnosis.  VM left for [place] as well.  Spoke with [employer], rep states patient could admit Tuesday 11/27 at the earliest.    CM spoke with daughter [name], notified that [facility] has no open beds at this time and if a bed comes available the earliest possible admit date would be 12/28.  Also provided with the names of the three facilities who have made bed offers.  CM did explain that once patient is out of quarantine period, patient will be medically stable for discharge and if [facility] does not have an open bed at that time, a selection will need to be made from the available bed offers.     Expected Discharge Plan: [place] Barriers to Discharge: Continued Medical Work up  Expected Discharge Plan and Services Expected Discharge Plan: [place]   Discharge Planning Services: CM Consult Post Acute Care Choice: [place] Living arrangements for the past 2 months: Single Family Home                                       Social Determinants of Health (SDOH) Interventions    Readmission Risk Interventions Readmission Risk Prevention Plan 02/10/2020  Transportation Screening Complete  HRI or [place] Complete  Social Work Consult for [place]  Planning/Counseling Complete  Palliative Care Screening Not Applicable  Medication Review Press photographer) Complete  Some recent data might be hidden

## 2021-06-23 NOTE — Progress Notes (Signed)
Progress Note    Stephanie Cortez   YWV:[id number]  DOB: 1934-04-05  DOA: 06/17/2021     6 PCP: Stephanie Ada, MD  Initial CC: SOB  Hospital Course: [name] is an 87 y.o. female with PMH chronic atrial fibrillation on Eliquis, CHF with preserved EF, COPD, HLD, chronic hyponatremia, acquired hypothyroidism, who presented to [facility] ED from home with complaints of progressively worsening shortness of breath with associated new onset productive cough.   Work up revealed covid-19 viral pneumonia with concern for coinfection with bacterial pneumonia.  Started on antiviral Remdesivir, IV steroids, bronchodialators.  IV Rocephin and azithromycin were added.  She was weaned off oxygen supplementation on 06/19/2021.  Interval History:  No events overnight.  Dyspnea at baseline. Still rather deconditioned appearing and is sitting up in recliner this morning.   Assessment & Plan: * COVID-19 virus infection- (present on admission) - given underlying lung disease patient is high risk for progression to severe disease - s/p remdesivir - continue steroids to complete course - continue IS and flutter  - continue OOB with PT/OT - plan is for d/c to SNF after isolation period  Acute on chronic respiratory failure with hypoxia ([place])- (present on admission) - on 2L nocturnal O2 at home - currently on RA rest   Severe sepsis (HCC)-resolved as of 06/21/2021, (present on admission) - tachycardia, neutropenia, hypoxia and pulm source - see covid   Hyponatremia- (present on admission) - possibly from poor oral intake on admission - has improved with ongoing better oral intake   Disorder of cervical esophageal region - Evaluated by SLP.  Concern for cervical esophageal dysphagia.  She had cervical web and prominent CP noted in 2013 with ongoing symptoms currently - Recommendation is for further evaluation with esophagram  Physical deconditioning - evaluated by PT/OT - plan is for SNF once isolation  period expires; diagnosed 12/27 with Covid  Chronic diastolic CHF (congestive heart failure) ([place])- (present on admission) - no s/s exacerbation  -Continue Lasix  Chronic anemia- (present on admission) - Baseline hemoglobin around 10 to 11 g/dL - Currently at baseline  Hypothyroid- (present on admission) - Continue Synthroid  A-fib ([place])- (present on admission) - Continue Eliquis    Old records reviewed in assessment of this patient  Antimicrobials: Remdesivir 06/17/2021 >> 06/21/21  DVT prophylaxis: Eliquis  Code Status:   Code Status: Full Code  Disposition Plan:  SNF Status is: Inpt  Objective: Blood pressure 111/65, pulse (!) 102, temperature (!) 97.5 F (36.4 C), temperature source Oral, resp. rate (!) 22, height 5\' 7"  (1.702 m), weight 62.4 kg, SpO2 98 %.  Examination:  Physical Exam Constitutional:      Appearance: Normal appearance.  HENT:     Head: Normocephalic and atraumatic.     Mouth/Throat:     Mouth: Mucous membranes are moist.  Eyes:     Extraocular Movements: Extraocular movements intact.  Cardiovascular:     Pulses: Normal pulses.     Heart sounds: Normal heart sounds.  Pulmonary:     Comments: Dry crackles throughout; no wheezing  Abdominal:     General: Bowel sounds are normal. There is no distension.     Palpations: Abdomen is soft.     Tenderness: There is no abdominal tenderness.  Musculoskeletal:        General: Normal range of motion.     Cervical back: Normal range of motion and neck supple.  Skin:    General: Skin is warm and dry.  Neurological:  General: No focal deficit present.     Mental Status: She is alert and oriented to person, place, and time.  Psychiatric:        Mood and Affect: Mood normal.        Behavior: Behavior normal.     Consultants:    Procedures:    Data Reviewed: I have personally reviewed labs and imaging studies    LOS: 6 days  Time spent: Greater than 50% of the 35 minute visit was spent  in counseling/coordination of care for the patient as laid out in the A&P.   Dwyane Dee, MD [facility] 06/23/2021, 5:10 PM

## 2021-06-23 NOTE — Progress Notes (Signed)
Physical Therapy Treatment Patient Details Name: Stephanie Cortez MRN: [id number] DOB: 1933-09-28 Today's Date: 06/23/2021   History of Present Illness 87 y.o. female admitted with COVID Pna, sepsis, after falling at home.  history significant for chronic atrial fibrillation on Eliquis, CHF with preserved EF, COPD, hyperlipidemia, chronic hyponatremia, acquired hypothyroidism, who presented to [facility] ED from home with complaints of progressively worsening shortness of breath.    PT Comments    Pt continues to require +2 assist for safe mobility. O2 83% on RA with ambulation. Placed on 1L [place] O2 end of session to aid recovery. Encouraged pt to work on Chiropodist and flutter valve throughout the day. Will continue to follow and progress activity as tolerated.     Recommendations for follow up therapy are one component of a multi-disciplinary discharge planning process, led by the attending physician.  Recommendations may be updated based on patient status, additional functional criteria and insurance authorization.  Follow Up Recommendations  Skilled nursing-short term rehab (<3 hours/day)     Assistance Recommended at Discharge Frequent or constant Supervision/Assistance  Equipment Recommendations  None recommended by PT    Recommendations for Other Services       Precautions / Restrictions Precautions Precautions: Fall Precaution Comments: monitor O2 Restrictions Weight Bearing Restrictions: No     Mobility  Bed Mobility Overal bed mobility: Needs Assistance Bed Mobility: Supine to Sit     Supine to sit: Mod assist;+2 for physical assistance     General bed mobility comments: Assist for trunk and LEs. Increased time. Cues provided.    Transfers Overall transfer level: Needs assistance Equipment used: Rolling walker (2 wheels) Transfers: Sit to/from Stand Sit to Stand: Mod assist;+2 physical assistance;+2 safety/equipment           General transfer comment:  Assist for anterior weightshift, power up, controlled descent. Cues for safety, technique, hand/LE placement. x2-once from elevated bed, once from low recliner. Poor anterior weigthtshifting even with elevated bed.    Ambulation/Gait Ambulation/Gait assistance: Min assist;+2 safety/equipment Gait Distance (Feet): 20 Feet Assistive device: Rolling walker (2 wheels) Gait Pattern/deviations: Step-through pattern;Decreased stride length;Trunk flexed       General Gait Details: Assist to stabilize pt throughout distance and to maneuver RW. Fall risk.Dyspnea 2/4. Cues for safety, posture. Fatigues easily. O2 83% on RA with ambulation.   Stairs             Wheelchair Mobility    Modified Rankin (Stroke Patients Only)       Balance Overall balance assessment: Needs assistance;History of Falls         Standing balance support: Bilateral upper extremity supported Standing balance-Leahy Scale: Poor                              Cognition Arousal/Alertness: Awake/alert Behavior During Therapy: WFL for tasks assessed/performed Overall Cognitive Status: Within Functional Limits for tasks assessed                                 General Comments: for the most part, cognition appeared [facility]        Exercises      General Comments        Pertinent Vitals/Pain Pain Assessment: Faces Faces Pain Scale: No hurt    Home Living  Prior Function            PT Goals (current goals can now be found in the care plan section) Progress towards PT goals: Progressing toward goals    Frequency    Min 3X/week      PT Plan Current plan remains appropriate    Co-evaluation              AM-PAC PT "6 Clicks" Mobility   Outcome Measure  Help needed turning from your back to your side while in a flat bed without using bedrails?: A Lot Help needed moving from lying on your back to sitting on the side of a flat  bed without using bedrails?: A Lot Help needed moving to and from a bed to a chair (including a wheelchair)?: A Lot Help needed standing up from a chair using your arms (e.g., wheelchair or bedside chair)?: A Lot Help needed to walk in hospital room?: A Lot Help needed climbing 3-5 steps with a railing? : Total 6 Click Score: 11    End of Session Equipment Utilized During Treatment: Gait belt Activity Tolerance: Patient limited by fatigue (limited by dyspnea) Patient left: in chair;with call bell/phone within reach;with chair alarm set   PT Visit Diagnosis: Muscle weakness (generalized) (M62.81);History of falling (Z91.81);Unsteadiness on feet (R26.81);Difficulty in walking, not elsewhere classified (R26.2)     Time: 3710-6269 PT Time Calculation (min) (ACUTE ONLY): 37 min  Charges:  $Gait Training: 23-37 mins                         Doreatha Massed, PT Acute Rehabilitation  Office: [phone] Pager: [phone]

## 2021-06-23 NOTE — Assessment & Plan Note (Addendum)
-   Evaluated by SLP.  Concern for cervical esophageal dysphagia.  She had cervical web and prominent CP noted in 2013 with ongoing symptoms currently - Esophagram reveals ongoing achalasia.  Appreciate pulmonology evaluation; she is moderate to high risk for undergoing EGD (see discussion under achalasia as well). Future need for anesthesia will be a risk/benefit conversation and patient does show understanding of the risks/benefits. For now, her goal is conservative measures

## 2021-06-23 NOTE — Evaluation (Signed)
Clinical/Bedside Swallow Evaluation Patient Details  Name: Stephanie Cortez MRN: [id number] Date of Birth: 1934/01/29  Today's Date: 06/23/2021 Time: SLP Start Time (ACUTE ONLY): 7829 SLP Stop Time (ACUTE ONLY): 1425 SLP Time Calculation (min) (ACUTE ONLY): 30 min  Past Medical History:  Past Medical History:  Diagnosis Date   Anemia    - Hgb 9.7gm% on 07/13/2008 in [place] 129gm% wiht normal irone levsl and ferritin 10/27/2008 in [place] Recurrent otitis/sinusitis   Anxiety    chronic BZ prn   Atrial fibrillation (HCC)    chronic anticoag   Bronchiectasis    >PFT 07/13/2008 in [place] 1.9L/76%, FVC 2.45L/74, Ratio 79, TLC 121%, DLCO 64%  AE BRonchiectasis - Dec 2010.New Rx:  outpatient - Feb 2011 - Rx outpatient   CHF (congestive heart failure) ([place])    COPD (chronic obstructive pulmonary disease) (HCC)    bronchiectasis   Cricopharyngeal achalasia    Depression    Diverticulosis    Dyslipidemia    Eczema    GERD with stricture    Glaucoma    H. 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[place])    Vertigo    Wears glasses    Past Surgical History:  Past Surgical History:  Procedure Laterality Date   BOWEL RESECTION N/A 02/08/2020   Procedure: SMALL BOWEL RESECTION;  Surgeon: Donnie Mesa, MD;  Location: [place];  Service: General;  Laterality: N/A;   BREAST SURGERY     br bx   CARDIAC CATHETERIZATION  07/01/2013   CATARACT EXTRACTION     both   COLONOSCOPY     ESOPHAGOSCOPY W/ BOTOX INJECTION  12/11/2011   Procedure: ESOPHAGOSCOPY WITH BOTOX INJECTION;  Surgeon: Rozetta Nunnery, MD;  Location: [place];  Service: ENT;  Laterality: N/A;  esophogoscopy with dilation, botox injection   FOOT SURGERY  03/14/2011   gastroc slide-rt   GASTROSTOMY N/A 02/08/2020   Procedure: INSERTION OF  GASTROSTOMY TUBE;  Surgeon: Donnie Mesa, MD;  Location: [facility];  Service: General;  Laterality: N/A;   HIP ARTHROPLASTY Right 10/28/2019   Procedure: ARTHROPLASTY BIPOLAR HIP (HEMIARTHROPLASTY);  Surgeon: Marybelle Killings, MD;  Location: [facility];  Service: Orthopedics;  Laterality: Right;   LAPAROTOMY N/A 02/08/2020   Procedure: EXPLORATORY LAPAROTOMY;  Surgeon: Donnie Mesa, MD;  Location: [place];  Service: General;  Laterality: N/A;   LYSIS OF ADHESION N/A 02/08/2020   Procedure: LYSIS OF ADHESIONS;  Surgeon: Donnie Mesa, MD;  Location: [place];  Service: General;  Laterality: N/A;   TOTAL ABDOMINAL HYSTERECTOMY     TOTAL HIP REVISION Right 11/16/2019   Procedure: TOTAL HIP REVISION BIPOLAR TO CEMENTED BIPOLAR;  Surgeon: Marybelle Killings, MD;  Location: [place];  Service: Orthopedics;  Laterality: Right;   WISDOM TOOTH EXTRACTION     HPI:  87 y.o. female admitted with COVID Pna, sepsis, after falling at home.  history significant for chronic atrial fibrillation on Eliquis, CHF with preserved EF, COPD, hyperlipidemia, chronic hyponatremia, acquired hypothyroidism, who presented to [facility] ED from home with complaints of progressively worsening shortness of breath    Assessment / Plan / Recommendation  Clinical Impression  Patient presents with clinical indications concerning for exacerbation of known cervical esophageal dysphagia.  Pt with  cervical web and prominent CP per prior DG esophagus 2013.  Pt - she would like work up completed to assess what is happening with her swallowing b/c she "wants to do everything to get well and get back to her normal life" and she admits to worsening dysphagia currently".  SLP questions if pt's known prominent CP has worsened.  She passed 3 ounce Yale water challenge- decreasing concern for liquid aspiration. No signficant focal CN deficits present concerning for neuro based deficit.  Productive cough apparent t/o eval - and productive to expel viscous secretions. RN also  reported pt coughed and expectorated applesauce when trying to take pill with applesauce earlier today.  Recommend pills crushed with applesauce or icecream currently - and recommend pt masticate well with all of her foods - following solids with liquids.  If DG esophagus unable to be performed this weekend, recommend consider Monday, December 26th. SLP Visit Diagnosis: Dysphagia, pharyngoesophageal phase (R13.14);Dysphagia, unspecified (R13.10)    Aspiration Risk  Mild aspiration risk    Diet Recommendation Regular;Thin liquid   Liquid Administration via: Cup;Straw Medication Administration: Crushed with puree Supervision: Patient able to self feed Compensations: Slow rate;Small sips/bites Postural Changes: Seated upright at 90 degrees;Remain upright for at least 30 minutes after po intake    Other  Recommendations Oral Care Recommendations: Oral care BID    Recommendations for follow up therapy are one component of a multi-disciplinary discharge planning process, led by the attending physician.  Recommendations may be updated based on patient status, additional functional criteria and insurance authorization.  Follow up Recommendations No SLP follow up      Assistance Recommended at Discharge    Functional Status Assessment    Frequency and Duration min 1 x/week  1 week       Prognosis Prognosis for Safe Diet Advancement: Good      Swallow Study   General Date of Onset: 06/23/21 HPI: 87 y.o. female admitted with COVID Pna, sepsis, after falling at home.  history significant for chronic atrial fibrillation on Eliquis, CHF with preserved EF, COPD, hyperlipidemia, chronic hyponatremia, acquired hypothyroidism, who presented to [facility] ED from home with complaints of progressively worsening shortness of breath Type of Study: Bedside Swallow Evaluation Diet Prior to this Study: Regular;Thin liquids Temperature Spikes Noted: No Respiratory Status: Room air;Nasal cannula History of  Recent Intubation: No Behavior/Cognition: Alert;Cooperative;Pleasant mood Oral Cavity Assessment: Within Functional Limits Oral Care Completed by SLP: No Oral Cavity - Dentition: Adequate natural dentition Vision: Functional for self-feeding Self-Feeding Abilities: Able to feed self Patient Positioning: Upright in chair Baseline Vocal Quality: Normal Volitional Cough: Strong;Other (Comment) (productive) Volitional Swallow: Able to elicit    Oral/Motor/Sensory Function Overall Oral Motor/Sensory Function: Other (comment) (minimal facial asymmetry on right)   Ice Chips Ice chips: Within functional limits Presentation: Self Fed   Thin Liquid Thin Liquid: Impaired Pharyngeal  Phase Impairments: Multiple swallows;Cough - Delayed    Nectar Thick Nectar Thick Liquid: Not tested   Honey Thick Honey Thick Liquid: Not tested   Puree Puree: Within functional limits Presentation: Self Fed;Spoon   Solid     Solid: Within functional limits Presentation: Self Fed;Spoon      Macario Golds 06/23/2021,2:44 PM  Kathleen Lime, MS [facility] SLP [place] Office [phone] Pager [phone]

## 2021-06-23 NOTE — Progress Notes (Signed)
SLP called [phone] to speak to pt re: potential swallow study/plans.  No answer.   Kathleen Lime, MS [facility] SLP Acute Rehab Services Office [phone] Pager [phone]

## 2021-06-24 DIAGNOSIS — U071 COVID-19: Secondary | ICD-10-CM | POA: Diagnosis not present

## 2021-06-24 DIAGNOSIS — J9621 Acute and chronic respiratory failure with hypoxia: Secondary | ICD-10-CM | POA: Diagnosis not present

## 2021-06-24 DIAGNOSIS — K220 Achalasia of cardia: Secondary | ICD-10-CM | POA: Diagnosis not present

## 2021-06-24 LAB — COMPREHENSIVE METABOLIC PANEL
ALT: 13 U/L (ref 0–44)
AST: 17 U/L (ref 15–41)
Albumin: 2.7 g/dL — ABNORMAL LOW (ref 3.5–5.0)
Alkaline Phosphatase: 89 U/L (ref 38–126)
Anion gap: 6 (ref 5–15)
BUN: 12 mg/dL (ref 8–23)
CO2: 27 mmol/L (ref 22–32)
Calcium: 8.1 mg/dL — ABNORMAL LOW (ref 8.9–10.3)
Chloride: 99 mmol/L (ref 98–111)
Creatinine, Ser: 0.42 mg/dL — ABNORMAL LOW (ref 0.44–1.00)
GFR, Estimated: >60 mL/min (ref >60)
Glucose, Bld: 93 mg/dL (ref 70–99)
Potassium: 3.1 mmol/L — ABNORMAL LOW (ref 3.5–5.1)
Sodium: 132 mmol/L — ABNORMAL LOW (ref 135–145)
Total Bilirubin: 1.1 mg/dL (ref 0.3–1.2)
Total Protein: 5.9 g/dL — ABNORMAL LOW (ref 6.5–8.1)

## 2021-06-24 LAB — CBC WITH DIFFERENTIAL/PLATELET
Abs Immature Granulocytes: 0.06 10*3/uL (ref 0.00–0.07)
Basophils Absolute: 0 10*3/uL (ref 0.0–0.1)
Basophils Relative: 0 %
Eosinophils Absolute: 0.3 10*3/uL (ref 0.0–0.5)
Eosinophils Relative: 5 %
HCT: 37.2 % (ref 36.0–46.0)
Hemoglobin: 11.6 g/dL — ABNORMAL LOW (ref 12.0–15.0)
Immature Granulocytes: 1 %
Lymphocytes Relative: 16 %
Lymphs Abs: 1.2 10*3/uL (ref 0.7–4.0)
MCH: 25.4 pg — ABNORMAL LOW (ref 26.0–34.0)
MCHC: 31.2 g/dL (ref 30.0–36.0)
MCV: 81.4 fL (ref 80.0–100.0)
Monocytes Absolute: 0.9 10*3/uL (ref 0.1–1.0)
Monocytes Relative: 13 %
Neutro Abs: 4.6 10*3/uL (ref 1.7–7.7)
Neutrophils Relative %: 65 %
Platelets: 306 10*3/uL (ref 150–400)
RBC: 4.57 MIL/uL (ref 3.87–5.11)
RDW: 17.5 % — ABNORMAL HIGH (ref 11.5–15.5)
WBC: 7.1 10*3/uL (ref 4.0–10.5)
nRBC: 0 % (ref 0.0–0.2)

## 2021-06-24 LAB — LACTATE DEHYDROGENASE: LDH: 178 U/L (ref 98–192)

## 2021-06-24 LAB — MAGNESIUM: Magnesium: 2 mg/dL (ref 1.7–2.4)

## 2021-06-24 LAB — C-REACTIVE PROTEIN: CRP: 8.3 mg/dL — ABNORMAL HIGH (ref <1.0)

## 2021-06-24 LAB — D-DIMER, QUANTITATIVE: D-Dimer, Quant: 0.8 ug/mL-FEU — ABNORMAL HIGH (ref 0.00–0.50)

## 2021-06-24 MED ORDER — ONDANSETRON HCL 4 MG/2ML IJ SOLN
4.0000 mg | Freq: Four times a day (QID) | INTRAMUSCULAR | Status: DC | PRN
  Administered 2021-06-24 – 2021-06-26 (×5): 4 mg via INTRAVENOUS
  Filled 2021-06-24 (×6): qty 2

## 2021-06-24 MED ORDER — POTASSIUM CHLORIDE 20 MEQ PO PACK
40.0000 meq | PACK | ORAL | Status: AC
  Administered 2021-06-24 (×2): 40 meq via ORAL
  Filled 2021-06-24 (×2): qty 2

## 2021-06-24 MED ORDER — SODIUM CHLORIDE 0.9 % IV SOLN
12.5000 mg | Freq: Once | INTRAVENOUS | Status: AC
  Administered 2021-06-24: 23:00:00 12.5 mg via INTRAVENOUS
  Filled 2021-06-24: qty 0.5

## 2021-06-24 MED ORDER — DM-GUAIFENESIN ER 30-600 MG PO TB12: 1.0000 | ORAL_TABLET | Freq: Two times a day (BID) | ORAL | Status: DC | PRN

## 2021-06-24 MED ORDER — GUAIFENESIN 100 MG/5ML PO LIQD
15.0000 mL | Freq: Four times a day (QID) | ORAL | Status: AC | PRN
  Administered 2021-06-24 – 2021-06-25 (×2): 15 mL via ORAL
  Filled 2021-06-24 (×2): qty 20

## 2021-06-24 NOTE — Assessment & Plan Note (Addendum)
-   Previously treated with Botox and has followed outpatient with GI in the past, Dr. Hilarie Fredrickson (outpatient followup to be arranged after discharge, discussed with her daughter as well) - Underwent barium swallow on 06/23/2021 which showed moderate esophageal dysmotility with transient/intermittent passage of contrast through the GE junction suggesting mild achalasia - continued discussions held with patient; overall she is wishing to remain conservative at this time and try to avoid anesthesia as she's worried about the risks associated with such in setting of her lung disease (this is not unreasonable and worth continuing to try).  - we will continue on Dysphagia level 3 diet at discharge to [place]; patient did not wish to attempt puree diet (although may still have to try this in the future if/when symptoms recur) vs short NPO period until able to swallow again. - if prolonged NPO, then patient will need to decide if she wishes to undergo EGD vs consideration of PEG placement for TF (which she can still eat with PEG in place, but at least PEG would provide adequate nutrition)  - I've discussed the above with her daughter and son in law who will continue discussions with patient at discharge as well

## 2021-06-24 NOTE — Progress Notes (Signed)
Progress Note    Stephanie Cortez   TIR:[id number]  DOB: 01-Jul-1934  DOA: 06/17/2021     7 PCP: [name], MD  Initial CC: SOB  Hospital Course: Stephanie Cortez is an 87 y.o. female with PMH chronic atrial fibrillation on Eliquis, CHF with preserved EF, COPD, HLD, chronic hyponatremia, acquired hypothyroidism, who presented to [facility] ED from home with complaints of progressively worsening shortness of breath with associated new onset productive cough.   Work up revealed covid-19 viral pneumonia with concern for coinfection with bacterial pneumonia.  Started on antiviral Remdesivir, IV steroids, bronchodialators.  IV Rocephin and azithromycin were added.  She was weaned off oxygen supplementation on 06/19/2021.  Interval History:  No events overnight.  Her breathing is comfortable however she is now having more difficulty with swallowing.  She vomited some this morning and is having more difficulty swallowing solids.  Reviewed findings from her esophagram yesterday and we will change her diet to a mechanical soft diet.  Given her underlying lung disease, she may be higher risk for undergoing EGD at this time, so for now we will attempt conservative measures, she was okay with this.  Assessment & Plan: * COVID-19 virus infection- (present on admission) - given underlying lung disease patient is high risk for progression to severe disease - s/p remdesivir - continue steroids to complete course - continue IS and flutter  - continue OOB with PT/OT - plan is for d/c to SNF after isolation period  Cricopharyngeal achalasia - Previously treated with Botox and has followed outpatient with GI in the past - Underwent barium swallow on 06/23/2021 which showed moderate esophageal dysmotility with transient/intermittent passage of contrast through the GE junction suggesting mild achalasia - Patient still regurgitating some foods and having difficulty swallowing.  At this time, will modify her diet to  mechanical soft and encouraged her to continue at least fluids for now and attempting soft diet -May need to involve pulmonology after the weekend for input regarding her ability to undergo EGD for possible dilation if needed  Acute on chronic respiratory failure with hypoxia ([place])- (present on admission) - on 2L nocturnal O2 at home - currently on RA at rest   Disorder of cervical esophageal region - Evaluated by SLP.  Concern for cervical esophageal dysphagia.  She had cervical web and prominent CP noted in 2013 with ongoing symptoms currently - Esophagram reveals ongoing achalasia.  Given her underlying respiratory status, would be higher risk for undergoing EGD at this time, for now, will try with softer diet  Physical deconditioning - evaluated by PT/OT - plan is for SNF once isolation period expires; diagnosed 12/27 with Covid  Chronic diastolic CHF (congestive heart failure) ([place])- (present on admission) - no s/s exacerbation  -Continue Lasix  Hyponatremia- (present on admission) - possibly from poor oral intake on admission - has improved with ongoing better oral intake   Severe sepsis (HCC)-resolved as of 06/21/2021, (present on admission) - tachycardia, neutropenia, hypoxia and pulm source - see covid   Chronic anemia- (present on admission) - Baseline hemoglobin around 10 to 11 g/dL - Currently at baseline  Hypothyroid- (present on admission) - Continue Synthroid  A-fib ([place])- (present on admission) - Continue Eliquis    Old records reviewed in assessment of this patient  Antimicrobials: Remdesivir 06/17/2021 >> 06/21/21  DVT prophylaxis: Eliquis  Code Status:   Code Status: Full Code  Disposition Plan:  SNF Status is: Inpt  Objective: Blood pressure 140/76, pulse 90, temperature 97.6 F (  36.4 C), temperature source Oral, resp. rate (!) 22, height 5\' 7"  (1.702 m), weight 62.4 kg, SpO2 96 %.  Examination:  Physical Exam Constitutional:       Appearance: Normal appearance.  HENT:     Head: Normocephalic and atraumatic.     Mouth/Throat:     Mouth: Mucous membranes are moist.  Eyes:     Extraocular Movements: Extraocular movements intact.  Cardiovascular:     Pulses: Normal pulses.     Heart sounds: Normal heart sounds.  Pulmonary:     Comments: Dry crackles throughout; no wheezing  Abdominal:     General: Bowel sounds are normal. There is no distension.     Palpations: Abdomen is soft.     Tenderness: There is no abdominal tenderness.  Musculoskeletal:        General: Normal range of motion.     Cervical back: Normal range of motion and neck supple.  Skin:    General: Skin is warm and dry.  Neurological:     General: No focal deficit present.     Mental Status: She is alert and oriented to person, place, and time.  Psychiatric:        Mood and Affect: Mood normal.        Behavior: Behavior normal.     Consultants:    Procedures:    Data Reviewed: I have personally reviewed labs and imaging studies    LOS: 7 days  Time spent: Greater than 50% of the 35 minute visit was spent in counseling/coordination of care for the patient as laid out in the A&P.   Dwyane Dee, MD [facility] 06/24/2021, 1:53 PM

## 2021-06-25 ENCOUNTER — Telehealth: Payer: Self-pay | Admitting: Emergency Medicine

## 2021-06-25 DIAGNOSIS — J9621 Acute and chronic respiratory failure with hypoxia: Secondary | ICD-10-CM | POA: Diagnosis not present

## 2021-06-25 DIAGNOSIS — U071 COVID-19: Secondary | ICD-10-CM | POA: Diagnosis not present

## 2021-06-25 DIAGNOSIS — J849 Interstitial pulmonary disease, unspecified: Secondary | ICD-10-CM

## 2021-06-25 DIAGNOSIS — R1319 Other dysphagia: Secondary | ICD-10-CM

## 2021-06-25 DIAGNOSIS — K220 Achalasia of cardia: Secondary | ICD-10-CM | POA: Diagnosis not present

## 2021-06-25 DIAGNOSIS — R933 Abnormal findings on diagnostic imaging of other parts of digestive tract: Secondary | ICD-10-CM

## 2021-06-25 LAB — CBC WITH DIFFERENTIAL/PLATELET
Abs Immature Granulocytes: 0.05 10*3/uL (ref 0.00–0.07)
Basophils Absolute: 0 10*3/uL (ref 0.0–0.1)
Basophils Relative: 0 %
Eosinophils Absolute: 0 10*3/uL (ref 0.0–0.5)
Eosinophils Relative: 0 %
HCT: 38.3 % (ref 36.0–46.0)
Hemoglobin: 11.9 g/dL — ABNORMAL LOW (ref 12.0–15.0)
Immature Granulocytes: 1 %
Lymphocytes Relative: 11 %
Lymphs Abs: 1 10*3/uL (ref 0.7–4.0)
MCH: 25.6 pg — ABNORMAL LOW (ref 26.0–34.0)
MCHC: 31.1 g/dL (ref 30.0–36.0)
MCV: 82.5 fL (ref 80.0–100.0)
Monocytes Absolute: 0.5 10*3/uL (ref 0.1–1.0)
Monocytes Relative: 6 %
Neutro Abs: 7 10*3/uL (ref 1.7–7.7)
Neutrophils Relative %: 82 %
Platelets: 362 10*3/uL (ref 150–400)
RBC: 4.64 MIL/uL (ref 3.87–5.11)
RDW: 17.6 % — ABNORMAL HIGH (ref 11.5–15.5)
WBC: 8.6 10*3/uL (ref 4.0–10.5)
nRBC: 0 % (ref 0.0–0.2)

## 2021-06-25 LAB — COMPREHENSIVE METABOLIC PANEL
ALT: 14 U/L (ref 0–44)
AST: 19 U/L (ref 15–41)
Albumin: 2.9 g/dL — ABNORMAL LOW (ref 3.5–5.0)
Alkaline Phosphatase: 102 U/L (ref 38–126)
Anion gap: 8 (ref 5–15)
BUN: 15 mg/dL (ref 8–23)
CO2: 26 mmol/L (ref 22–32)
Calcium: 8.8 mg/dL — ABNORMAL LOW (ref 8.9–10.3)
Chloride: 97 mmol/L — ABNORMAL LOW (ref 98–111)
Creatinine, Ser: 0.57 mg/dL (ref 0.44–1.00)
GFR, Estimated: >60 mL/min (ref >60)
Glucose, Bld: 129 mg/dL — ABNORMAL HIGH (ref 70–99)
Potassium: 4.2 mmol/L (ref 3.5–5.1)
Sodium: 131 mmol/L — ABNORMAL LOW (ref 135–145)
Total Bilirubin: 1 mg/dL (ref 0.3–1.2)
Total Protein: 6.4 g/dL — ABNORMAL LOW (ref 6.5–8.1)

## 2021-06-25 LAB — LACTATE DEHYDROGENASE: LDH: 174 U/L (ref 98–192)

## 2021-06-25 LAB — D-DIMER, QUANTITATIVE: D-Dimer, Quant: 0.9 ug/mL-FEU — ABNORMAL HIGH (ref 0.00–0.50)

## 2021-06-25 LAB — MAGNESIUM: Magnesium: 1.9 mg/dL (ref 1.7–2.4)

## 2021-06-25 LAB — C-REACTIVE PROTEIN: CRP: 5.1 mg/dL — ABNORMAL HIGH (ref <1.0)

## 2021-06-25 MED ORDER — GABAPENTIN 250 MG/5ML PO SOLN
200.0000 mg | Freq: Once | ORAL | Status: AC
  Administered 2021-06-25: 200 mg via ORAL
  Filled 2021-06-25: qty 4

## 2021-06-25 MED ORDER — GABAPENTIN 100 MG PO CAPS
200.0000 mg | ORAL_CAPSULE | Freq: Once | ORAL | Status: AC
  Administered 2021-06-25: 02:00:00 200 mg via ORAL
  Filled 2021-06-25: qty 2

## 2021-06-25 NOTE — Consult Note (Signed)
Gastroenterology Inpatient Consultation   Attending Requesting Consult Dwyane Dee, [facility] Day: 9  Reason for Consult Dysphagia    History of Present Illness  Stephanie Cortez is a 87 y.o. 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  The GI service is consulted for evaluation and management of dysphagia.  The patient is currently hospitalized after progressive shortness of breath and found to have COVID-19 pneumonia with superinfection.  She has been in the hospital since 12/17.  Due to difficulty swallowing based on patient discussion, she underwent a barium swallow that suggested dysmotility and possible distal achalasia versus a distal stricture.  Her last dose of Eliquis was 12/24 PM.  Today, the patient is feeling slightly better than yesterday in regards to her lung issues.  She has had a modification in her diet.  She feels that she has had dysphagia symptoms progress over the course of the last 6 months or so but it has become much more apparent the setting of her recent COVID-19 infection and bacterial superinfection.  The patient states she has not had progressive heartburn symptoms.  Dysphagia's to both liquids and solids.  She denies any hematemesis or coffee-ground emesis.  She states that she is always felt a globus sensation due to her previous cricopharyngeal achalasia but that is not what she is feeling at this time.  She does have to regurgitate food a few times a week.  GI Review of Systems Positive as above Negative for odynophagia, early satiety, change in bowel habits, melena, hematochezia   Review of Systems  General: Positive for unintentional weight loss; currently denies fever/chills HEENT: Denies oral lesions Cardiovascular: Denies chest  pain Pulmonary: Positive for overall shortness of breath that is beyond her normal baseline Gastroenterological: See HPI Genitourinary: Denies darkened urine Hematological: Positive for history of easy bruising/bleeding Dermatological: Denies jaundice Psychological: Mood is anxious to get better   Histories  Past Medical History Past Medical History:  Diagnosis Date   Anemia    - Hgb 9.7gm% on 07/13/2008 in [place] -  Hgg 129gm% wiht normal irone levsl and ferritin 10/27/2008 in [place] Recurrent otitis/sinusitis   Anxiety    chronic BZ prn   Atrial fibrillation (HCC)    chronic anticoag   Bronchiectasis    >PFT 07/13/2008 in [place] 1.9L/76%, FVC 2.45L/74, Ratio 79, TLC 121%, DLCO 64%  AE BRonchiectasis - Dec 2010.New Rx:  outpatient - Feb 2011 - Rx outpatient   CHF (congestive heart failure) ([place])    COPD (chronic obstructive pulmonary disease) (HCC)    bronchiectasis   Cricopharyngeal achalasia    Depression    Diverticulosis    Dyslipidemia    Eczema    GERD with stricture    Glaucoma    H. 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[place])    Vertigo    Wears glasses    Past Surgical History:  Procedure Laterality Date   BOWEL RESECTION N/A 02/08/2020   Procedure: SMALL BOWEL RESECTION;  Surgeon: Donnie Mesa, MD;  Location: [facility];  Service: General;  Laterality: N/A;   BREAST SURGERY     br bx   CARDIAC CATHETERIZATION  07/01/2013   CATARACT EXTRACTION     both   COLONOSCOPY     ESOPHAGOSCOPY W/ BOTOX INJECTION  12/11/2011   Procedure: ESOPHAGOSCOPY WITH BOTOX INJECTION;  Surgeon: Rozetta Nunnery, MD;  Location: [facility];  Service: ENT;  Laterality: N/A;  esophogoscopy with dilation, botox injection   FOOT SURGERY  03/14/2011   gastroc slide-rt   GASTROSTOMY N/A 02/08/2020    Procedure: INSERTION OF GASTROSTOMY TUBE;  Surgeon: Donnie Mesa, MD;  Location: [facility];  Service: General;  Laterality: N/A;   HIP ARTHROPLASTY Right 10/28/2019   Procedure: ARTHROPLASTY BIPOLAR HIP (HEMIARTHROPLASTY);  Surgeon: Marybelle Killings, MD;  Location: [facility];  Service: Orthopedics;  Laterality: Right;   LAPAROTOMY N/A 02/08/2020   Procedure: EXPLORATORY LAPAROTOMY;  Surgeon: Donnie Mesa, MD;  Location: [facility];  Service: General;  Laterality: N/A;   LYSIS OF ADHESION N/A 02/08/2020   Procedure: LYSIS OF ADHESIONS;  Surgeon: Donnie Mesa, MD;  Location: [facility];  Service: General;  Laterality: N/A;   TOTAL ABDOMINAL HYSTERECTOMY     TOTAL HIP REVISION Right 11/16/2019   Procedure: TOTAL HIP REVISION BIPOLAR TO CEMENTED BIPOLAR;  Surgeon: Marybelle Killings, MD;  Location: [facility];  Service: Orthopedics;  Laterality: Right;   WISDOM TOOTH EXTRACTION      Allergies Allergies  Allergen Reactions   Doxycycline Other (See Comments)    Rash on face and neck   Hydrocodone Other (See Comments)    Other reaction(s): hallucinations   Vicodin [Hydrocodone-Acetaminophen] Other (See Comments)    Hallucinations    Flagyl [Metronidazole] Nausea And Vomiting   Moxifloxacin Other (See Comments)     Weakness/fatigue   Pantoprazole Sodium Rash   Penicillins Rash    Has patient had a PCN reaction causing immediate rash, facial/tongue/throat swelling, SOB or lightheadedness with hypotension:unsure Has patient had a PCN reaction causing severe rash involving mucus membranes or skin necrosis:unsure Has patient had a PCN reaction that required hospitalization:No Has patient had a PCN reaction occurring within the last 10 years:Yes Cannot recall exact reaction due to time lapse If all of the above answers are "NO", then may proceed with Cephalosporin use.  Other reaction(s): Unknown    Family History Family History  Problem Relation Age of Onset   Hypertension Mother    Stroke Mother    Emphysema  Brother    Other Father        miner's lung   Cancer Father        Lung   Colon polyps Sister    Pancreatic cancer Sister    Kidney disease Sister    Atrial fibrillation Other        siblings   The patient's FH is negative for IBD/IBS/Liver Disease/GI Malignancies.  Social History Social History   Socioeconomic History   Marital status: Married    Spouse name: Not on file   Number of children: 2   Years of education: Not on file   Highest education level: Not on file  Occupational History   Occupation: retired Product manager: [employer]  Tobacco Use   Smoking status: Never   Smokeless tobacco: Never  Vaping Use   Vaping Use: Never used  Substance and Sexual Activity   Alcohol use: No   Drug use: No   Sexual activity: Not on file    Comment: Hysterectomy  Other Topics Concern  Not on file  Social History Narrative   2 children 4 grandchildren   Recently moved to [place] from [place]   Retired Pharmacist, hospital   Grew up in [place]. Moved to [place]. Moved to [place] Feb 2010 due to duaghther living in [place]. Son lives in [place], [place]. Husband works as Scientist, physiological in [place].   Social Determinants of Health   Financial Resource Strain: Not on file  Food Insecurity: Not on file  Transportation Needs: Not on file  Physical Activity: Not on file  Stress: Not on file  Social Connections: Not on file  Intimate Partner Violence: Not on file    Medications  Home Medications No current facility-administered medications on file prior to encounter.   Current Outpatient Medications on File Prior to Encounter  Medication Sig Dispense Refill   acetaminophen (TYLENOL) 325 MG tablet Take 2 tablets (650 mg total) by mouth every 6 (six) hours as needed for mild pain (or Fever >/= 101).     apixaban (ELIQUIS) 2.5 MG TABS tablet Take 1 tablet (2.5 mg total) by mouth 2 (two) times daily. 180 tablet 3   FLUoxetine (PROZAC) 20 MG capsule Take 20 mg by mouth  daily.     furosemide (LASIX) 20 MG tablet Take 20 mg by mouth daily.     hydrOXYzine (ATARAX/VISTARIL) 25 MG tablet Take 12.5-25 mg by mouth every 8 (eight) hours as needed for itching.     levothyroxine (SYNTHROID) 50 MCG tablet Take 1 tablet (50 mcg total) by mouth daily. 90 tablet 3   omeprazole (PRILOSEC) 40 MG capsule Take 1 capsule (40 mg total) by mouth 2 (two) times daily before a meal. 90 capsule 3   potassium chloride (KLOR-CON) 8 MEQ tablet Take 16 mEq by mouth daily.     pravastatin (PRAVACHOL) 40 MG tablet Take 1 tablet (40 mg total) by mouth at bedtime. 30 tablet 0   psyllium (METAMUCIL) 58.6 % packet Take 1 packet by mouth daily as needed (constipation).     SYMBICORT 80-4.5 MCG/ACT inhaler INHALE 2 PUFFS INTO THE LUNGS TWICE DAILY (Patient taking differently: Inhale 2 puffs into the lungs in the morning and at bedtime.) 10.2 g 3   Scheduled Inpatient Medications  apixaban  2.5 mg Oral BID   furosemide  20 mg Oral Daily   Ipratropium-Albuterol  1 puff Inhalation TID   levothyroxine  50 mcg Oral Daily   LORazepam  0.25 mg Intravenous Once   pravastatin  40 mg Oral QHS   predniSONE  50 mg Oral Daily   sodium chloride  1 g Oral TID WC   Continuous Inpatient Infusions  PRN Inpatient Medications acetaminophen **OR** acetaminophen, albuterol, guaiFENesin, hydrOXYzine, ondansetron (ZOFRAN) IV   Physical Examination  BP 118/76 (BP Location: Right Arm)    Pulse 87    Temp 98.3 F (36.8 C)    Resp 19    Ht 5\' 7"  (1.702 m)    Wt 62.6 kg    LMP  (LMP Unknown)    SpO2 97%    BMI 21.62 kg/m  GEN: Elderly, appears stated age, does not appear chronically ill  PSYCH: Cooperative, without pressured speech EYE: Conjunctivae pink, sclerae anicteric ENT: MMM CV: Nontachycardic RESP: Decreased breath sounds at the bases bilaterally, rales appreciated, mild wheezing appreciated GI: NABS, soft, mildly protuberant abdomen, nontender, without rebound or guarding MSK/EXT: Lower extremity  edema present SKIN: No jaundice NEURO:  Alert & Oriented x 3, no focal deficits   Review of  Data  I reviewed the following data at the time of this encounter:  Laboratory Studies   Recent Labs  Lab 06/19/21 0605 06/20/21 0357 06/21/21 0416 06/22/21 0422 06/24/21 0337  NA 128*   < > 132*   < > 132*  K 3.8   < > 3.2*   < > 3.1*  CL 93*   < > 95*   < > 99  CO2 27   < > 29   < > 27  BUN 15   < > 17   < > 12  CREATININE 0.45   < > 0.50   < > 0.42*  GLUCOSE 199*   < > 145*   < > 93  CALCIUM 8.4*   < > 8.2*   < > 8.1*  MG 1.9  --  1.9   < > 2.0  PHOS 1.8*  --  3.3  --   --    < > = values in this interval not displayed.   Recent Labs  Lab 06/24/21 0337  AST 17  ALT 13  ALKPHOS 89    Recent Labs  Lab 06/22/21 0422 06/23/21 0410 06/24/21 0337  WBC 13.2* 8.2 7.1  HGB 12.6 11.5* 11.6*  HCT 40.6 36.6 37.2  PLT 300 284 306   No results for input(s): APTT, INR in the last 168 hours.   Imaging Studies  October 2022 CT abdomen pelvis with contrast IMPRESSION: 1. No acute findings are noted in the abdomen or pelvis to account for the patient's symptoms. 2. Tiny ventral hernia in the anterior pelvic wall containing only a small amount of fat. No associated bowel incarceration or obstruction at this time. 3. Cardiomegaly with severe right atrial dilatation. Findings in the lung bases suggest pulmonary edema. Clinical correlation for signs and symptoms of congestive heart failure is recommended. 4. Probable passive hepatic congestion resulting and perfusion anomalies throughout the right lobe of the liver. Liver has a nodular contour, suggesting underlying cirrhosis. No discrete hepatic lesions are confidently identified on today's examination. 5. Aortic atherosclerosis. 6. Additional incidental findings, as above.  June 23, 2021 esophagram IMPRESSION: Moderate esophageal dysmotility, with only transient/intermittent passage of contrast via the GE junction,  confirming patency but suggesting mild achalasia. Narrowing/stenosis at the GE junction is possible.   GI Procedures and Studies  2013 endoscopy Could not advance scope beyond pharynx.  Retroflexion was not performed.  The scope was withdrawn from the patient and procedure completed. Impression cervical stenosis or Zenker's diverticulum   Assessment  Ms. Fetterly is a 87 y.o. 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  The GI service is consulted for evaluation and management of dysphagia.  The patient is hemodynamically stable.  Clinically she is slowly improving from her COVID infection and bacterial superinfection.  Her dysphagia symptoms are present but she is a very high risk individual for endoscopic evaluation.  Any aspiration could lead her to significant issues and in the setting of her underlying ILD as well as recent infections of COVID and bacterial superinfection, I think she has more risk than potential benefit with endoscopy currently.  As time passes from this particular infections, if she continues to have issues an endoscopic evaluation will still remain high risk but may be a little bit easier to consider if she is  having progressive symptoms.  Unless the patient is not able to tolerate the current dysphagia diet, and chewing thoroughly, and taking longer to eat, we would not plan an endoscopic evaluation as an inpatient.  If any endoscopic evaluation were to ever be considered, it would need to be done in the hospital-based setting, it would require pulmonary optimization.  She would like to follow-up with her primary gastroenterologist Dr. Hilarie Fredrickson whom she trusts significantly.  We will work on arranging that the following weeks.   Plan/Recommendations  Continue PPI once  daily No plan for endoscopic evaluation per patient request and desire at this time Continue to follow dysphagia diet and asked nutrition and SLP if other diet may be necessary or if additional work-up could be required We will schedule a follow-up with her primary gastroenterologist per her request in the coming weeks to further decide if symptoms are persisting or progressing whether endoscopic evaluation is reasonable due to the abnormal barium swallow with possible dysmotility and possible distal GE junction narrowing.  The patient was appreciative for the evaluation today.  All patient questions were answered to the best of my ability, and the patient agrees to the aforementioned plan of action with follow-up as indicated.   Thank you for this consult.  We will continue to follow.  Please page/call with questions or concerns.   Justice Britain, MD [facility] Office # [phone]

## 2021-06-25 NOTE — Telephone Encounter (Signed)
Patient will need a hosp f/u visit with Dr Chase Caller, thanks.

## 2021-06-25 NOTE — Plan of Care (Signed)
  Problem: Respiratory: Goal: Will maintain a patent airway Outcome: Progressing   

## 2021-06-25 NOTE — Progress Notes (Signed)
Progress Note    Stephanie Cortez   FAO:[id number]  DOB: 07-19-1933  DOA: 06/17/2021     8 PCP: Stephanie Ada, MD  Initial CC: SOB  Hospital Course: [name] is an 87 y.o. female with PMH chronic atrial fibrillation on Eliquis, CHF with preserved EF, COPD, HLD, chronic hyponatremia, acquired hypothyroidism, who presented to [facility] ED from home with complaints of progressively worsening shortness of breath with associated new onset productive cough.   Work up revealed covid-19 viral pneumonia with concern for coinfection with bacterial pneumonia.  Started on antiviral Remdesivir, IV steroids, bronchodialators.  IV Rocephin and azithromycin were added.  She was weaned off oxygen supplementation on 06/19/2021.  Interval History:  No events overnight.  Remains on RA. Eating b'fast better in bed this am and denied any choking/gagging/regurgitation.   Assessment & Plan: * COVID-19 virus infection- (present on admission) - given underlying lung disease patient is high risk for progression to severe disease - s/p remdesivir - continue steroids to complete course - continue IS and flutter  - continue OOB with PT/OT - plan is for d/c to SNF after isolation period  Cricopharyngeal achalasia - Previously treated with Botox and has followed outpatient with GI in the past - Underwent barium swallow on 06/23/2021 which showed moderate esophageal dysmotility with transient/intermittent passage of contrast through the GE junction suggesting mild achalasia - swallowing food better today; continue mech soft diet - follow up GI and pulm evals in case of need for EGD and dilation still  Acute on chronic respiratory failure with hypoxia ([place])- (present on admission) - on 2L nocturnal O2 at home - currently on RA at rest   Disorder of cervical esophageal region - Evaluated by SLP.  Concern for cervical esophageal dysphagia.  She had cervical web and prominent CP noted in 2013 with ongoing symptoms  currently - Esophagram reveals ongoing achalasia.  Given her underlying ILD, she may be higher than expected risk for liberating from the vent after intubation for an EGD; will ask for pulmonology evaluation to help guide if this is something to consider if dysphagia worsens   Physical deconditioning - evaluated by PT/OT - plan is for SNF once isolation period expires; diagnosed 12/27 with Covid  Chronic diastolic CHF (congestive heart failure) ([place])- (present on admission) - no s/s exacerbation  -Continue Lasix  Hyponatremia- (present on admission) - possibly from poor oral intake on admission - has improved with ongoing better oral intake   Severe sepsis (HCC)-resolved as of 06/21/2021, (present on admission) - tachycardia, neutropenia, hypoxia and pulm source - see covid   Chronic anemia- (present on admission) - Baseline hemoglobin around 10 to 11 g/dL - Currently at baseline  Hypothyroid- (present on admission) - Continue Synthroid  A-fib ([place])- (present on admission) - Continue Eliquis    Old records reviewed in assessment of this patient  Antimicrobials: Remdesivir 06/17/2021 >> 06/21/21  DVT prophylaxis: Eliquis  Code Status:   Code Status: Full Code  Disposition Plan:  SNF Status is: Inpt  Objective: Blood pressure 118/76, pulse 87, temperature 98.3 F (36.8 C), resp. rate 18, height 5\' 7"  (1.702 m), weight 62.6 kg, SpO2 97 %.  Examination:  Physical Exam Constitutional:      Appearance: Normal appearance.  HENT:     Head: Normocephalic and atraumatic.     Mouth/Throat:     Mouth: Mucous membranes are moist.  Eyes:     Extraocular Movements: Extraocular movements intact.  Cardiovascular:     Pulses: Normal  pulses.     Heart sounds: Normal heart sounds.  Pulmonary:     Comments: Dry crackles throughout; no wheezing  Abdominal:     General: Bowel sounds are normal. There is no distension.     Palpations: Abdomen is soft.     Tenderness: There is  no abdominal tenderness.  Musculoskeletal:        General: Normal range of motion.     Cervical back: Normal range of motion and neck supple.  Skin:    General: Skin is warm and dry.  Neurological:     General: No focal deficit present.     Mental Status: She is alert and oriented to person, place, and time.  Psychiatric:        Mood and Affect: Mood normal.        Behavior: Behavior normal.     Consultants:    Procedures:    Data Reviewed: I have personally reviewed labs and imaging studies    LOS: 8 days  Time spent: Greater than 50% of the 35 minute visit was spent in counseling/coordination of care for the patient as laid out in the A&P.   Dwyane Dee, MD [facility] 06/25/2021, 11:46 AM

## 2021-06-25 NOTE — Consult Note (Signed)
NAME:  Stephanie Cortez, MRN:  [id number], DOB:  1933-08-04, LOS: 8 ADMISSION DATE:  06/17/2021, CONSULTATION DATE:  06/25/21 REFERRING MD:  Dr Sabino Gasser, TRH, CHIEF COMPLAINT:  Acute on chronic resp failure.    History of Present Illness:  87 year old woman who is followed in our office by Dr. Chase Caller.  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  Also COPD with bronchiectasis.  She has documented nocturnal hypoxemia and uses oxygen at 2L/min while sleeping. Past medical history significant also for history of achalasia and dysphagia, atrial fibrillation, hypertension with diastolic CHF, hypothyroidism, depression. She was admitted 06/17/2021 with URI symptoms, progressive cough, progressive shortness of breath and found to have COVID-19 pneumonia.  She was treated with antivirals, corticosteroids and also empiric antibiotics for possible superimposed bacterial pneumonia.  She has improved overall, is down to RA at rest, is using o2 with sleep.  Course complicated by some flaring of her dysphagia, esophagram shows achalasia. She is being considered for possible EGD, ? Needs dilation. Overall debilitation, planning d/c to SNF  Pertinent  Medical History    Past Medical History:  Diagnosis Date   Anemia    - Hgb 9.7gm% on 07/13/2008 in [place] -  Hgg 129gm% wiht normal irone levsl and ferritin 10/27/2008 in [place] Recurrent otitis/sinusitis   Anxiety    chronic BZ prn   Atrial fibrillation (HCC)    chronic anticoag   Bronchiectasis    >PFT 07/13/2008 in [place] 1.9L/76%, FVC 2.45L/74, Ratio 79, TLC 121%, DLCO 64%  AE BRonchiectasis - Dec 2010.New Rx:  outpatient - Feb 2011 - Rx outpatient   CHF (congestive heart failure) (HCC)    COPD (chronic obstructive pulmonary disease) (HCC)    bronchiectasis   Cricopharyngeal achalasia    Depression    Diverticulosis    Dyslipidemia    Eczema    GERD with stricture    Glaucoma    H. pylori infection     Hypertension    Hyponatremia    chronic, s/p endo eval 06/2012   Hypothyroid    IBS (irritable bowel syndrome)    Lumbar disc disease    Neuropathy of both feet    Segmental colitis ([place])    Vertigo    Wears glasses     Significant Hospital Events: Including procedures, antibiotic start and stop dates in addition to other pertinent events   PFT 12/19/2020 shows spirometry consistent with severe restriction, FEV1 0.95 L (48% predicted).  She has a decreased diffusion capacity that does correct to the normal range when adjusted for her alveolar volume. High-resolution CT scan of the chest 11/24/2020 > scattered patchy bilateral groundglass infiltrates associated with fibrotic change, traction bronchiectasis.  Not in a UIP pattern.  Interim History / Subjective:  Reports that she is clearing daily copious, clear to yellow Has been working with PT and able to ambulate without O2 No chest pain Still feels like she has some regurgitation.  Swallowing very small bites as per SLP recommendations   Objective   Blood pressure 118/76, pulse 87, temperature 98.3 F (36.8 C), resp. rate 18, height 5\' 7"  (1.702 m), weight 62.6 kg, SpO2 97 %.        Intake/Output Summary (Last 24 hours) at 06/25/2021 1128 Last data filed at 06/25/2021 0413 Gross per 24 hour  Intake 120 ml  Output 1250 ml  Net -1130 ml   Filed Weights   06/23/21 0551 06/24/21 0607 06/25/21 0422  Weight: 62.4  kg 62.4 kg 62.6 kg    Examination: General: Thin elderly woman, no distress HENT: Mouth somewhat dry which is causing a little bit of dysarthria.  Pupils equal Lungs: Bilateral inspiratory and expiratory squeaks and crackles Cardiovascular: Irregular, distant, no murmur Abdomen: Nondistended, positive bowel sounds Extremities: No edema Neuro: Awake, alert, interacting appropriately.  Follows commands and answers questions.  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 She has been treated with botox by ENT in the past, is under eval currently for possible EGD and dilation.   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  Also with a hx chronic dCHF, that appears to be well-compensated.  She is back to her usual O2 requirement of 2L/min at night. Her exertional saturations aren't known at this time.   Based on her chronic lung disease, particularly her interstitial lung disease with associated severe restriction, chronic hypoxemia, she is at least moderate to high risk for intervention with constellation of deep sedation.  She would be high risk for general anesthesia.  This includes risk for prolonged hospitalization, prolonged mechanical ventilation or even death.  Need to consider these risks and whether benefits outweigh them before proceeding.  That said she appears to be improved from her COVID-19 pneumonitis, approaching baseline, so I do not believe testing would need to be deferred specifically because she has had this acute illness.  The chronic lung disease is the important factor for consideration here.  Agree with completing steroid course.  Pulmonary hygiene techniques including I-S, flutter, PT/OT.  Need her oxygen at 2 L/min while sleeping.  Has been working with PT on room air.  Need to confirm that she has had ambulatory oximetry to ensure no indication for exertional oxygen at discharge.  She is on Combivent 3 times daily, has albuterol available to use if needed.  Her home regimen is Symbicort.  Continue to treat her chronic issues including her atrial fibrillation, diastolic CHF, hypothyroidism as planned.  I will make her a follow-up visit with Dr. Chase Caller.  Please call if we can assist in any  way.  Labs   CBC: Recent Labs  Lab 06/21/21 0416 06/22/21 0422 06/23/21 0410 06/24/21 0337 06/25/21 0334  WBC 7.9 13.2* 8.2 7.1 8.6  NEUTROABS  --  9.1* 5.6 4.6 7.0  HGB 11.3* 12.6 11.5* 11.6* 11.9*  HCT 35.5* 40.6 36.6 37.2 38.3  MCV 80.9 81.7 80.6 81.4 82.5  PLT 265 300 284 306 381    Basic Metabolic Panel: Recent Labs  Lab 06/19/21 0605 06/20/21 0357 06/21/21 0416 06/22/21 0422 06/23/21 0410 06/24/21 0337 06/25/21 0334  NA 128*   < > 132* 128* 132* 132* 131*  K 3.8   < > 3.2* 3.7 3.2* 3.1* 4.2  CL 93*   < > 95* 95* 96* 99 97*  CO2 27   < > 29 25 27 27 26   GLUCOSE 199*   < > 145* 118* 82 93 129*  BUN 15   < > 17 14 13 12 15   CREATININE 0.45   < > 0.50 0.46 0.43* 0.42* 0.57  CALCIUM 8.4*   < > 8.2* 8.3* 8.4* 8.1* 8.8*  MG 1.9  --  1.9 2.0 1.8 2.0  1.9  PHOS 1.8*  --  3.3  --   --   --   --    < > = values in this interval not displayed.   GFR: Estimated Creatinine Clearance: 50 mL/min (by C-G formula based on SCr of 0.57 mg/dL). Recent Labs  Lab 06/22/21 0422 06/23/21 0410 06/24/21 0337 06/25/21 0334  WBC 13.2* 8.2 7.1 8.6    Liver Function Tests: Recent Labs  Lab 06/21/21 0416 06/22/21 0422 06/23/21 0410 06/24/21 0337 06/25/21 0334  AST 26 28 18 17 19   ALT 17 19 15 13 14   ALKPHOS 103 109 89 89 102  BILITOT 0.7 0.8 0.7 1.1 1.0  PROT 6.2* 7.0 5.7* 5.9* 6.4*  ALBUMIN 2.6* 3.1* 2.5* 2.7* 2.9*   No results for input(s): LIPASE, AMYLASE in the last 168 hours. No results for input(s): AMMONIA in the last 168 hours.  ABG    Component Value Date/Time   PHART 7.393 02/08/2020 1844   PCO2ART 37.3 02/08/2020 1844   PO2ART 362 (H) 02/08/2020 1844   HCO3 27.6 06/18/2021 0440   TCO2 24 02/08/2020 1844   ACIDBASEDEF 2.0 02/08/2020 1844   O2SAT 90.1 06/18/2021 0440     Coagulation Profile: No results for input(s): INR, PROTIME in the last 168 hours.  Cardiac Enzymes: No results for input(s): CKTOTAL, CKMB, CKMBINDEX, TROPONINI in the last 168  hours.  HbA1C: Hgb A1c MFr Bld  Date/Time Value Ref Range Status  02/09/2020 06:40 AM 5.3 4.8 - 5.6 % Final    Comment:    (NOTE) Pre diabetes:          5.7%-6.4%  Diabetes:              >6.4%  Glycemic control for   <7.0% adults with diabetes   10/07/2012 12:00 AM 5.4 4.0 - 6.0 % Final    CBG: No results for input(s): GLUCAP in the last 168 hours.  Review of Systems:   As per HPI and subjective  Past Medical History:  She,  has a past medical history of Anemia, Anxiety, Atrial fibrillation ([place]), Bronchiectasis, CHF (congestive heart failure) ([place]), COPD (chronic obstructive pulmonary disease) ([place]), Cricopharyngeal achalasia, Depression, Diverticulosis, Dyslipidemia, Eczema, GERD with stricture, Glaucoma, H. pylori infection, Hypertension, Hyponatremia, Hypothyroid, IBS (irritable bowel syndrome), Lumbar disc disease, Neuropathy of both feet, Segmental colitis ([place]), Vertigo, and Wears glasses.   Surgical History:   Past Surgical History:  Procedure Laterality Date   BOWEL RESECTION N/A 02/08/2020   Procedure: SMALL BOWEL RESECTION;  Surgeon: Donnie Mesa, MD;  Location: [facility];  Service: General;  Laterality: N/A;   BREAST SURGERY     br bx   CARDIAC CATHETERIZATION  07/01/2013   CATARACT EXTRACTION     both   COLONOSCOPY     ESOPHAGOSCOPY W/ BOTOX INJECTION  12/11/2011   Procedure: ESOPHAGOSCOPY WITH BOTOX INJECTION;  Surgeon: Rozetta Nunnery, MD;  Location: [facility];  Service: ENT;  Laterality: N/A;  esophogoscopy with dilation, botox injection   FOOT SURGERY  03/14/2011   gastroc slide-rt   GASTROSTOMY N/A 02/08/2020   Procedure: INSERTION OF GASTROSTOMY TUBE;  Surgeon: Donnie Mesa, MD;  Location: [facility];  Service: General;  Laterality: N/A;   HIP ARTHROPLASTY Right 10/28/2019   Procedure: ARTHROPLASTY BIPOLAR HIP (HEMIARTHROPLASTY);  Surgeon: Marybelle Killings, MD;  Location: [facility];  Service: Orthopedics;  Laterality: Right;   LAPAROTOMY N/A  02/08/2020   Procedure: EXPLORATORY LAPAROTOMY;  Surgeon: Donnie Mesa, MD;  Location: [facility];  Service: General;  Laterality: N/A;   LYSIS OF ADHESION N/A 02/08/2020   Procedure: LYSIS OF ADHESIONS;  Surgeon: Donnie Mesa, MD;  Location: [facility];  Service: General;  Laterality: N/A;   TOTAL ABDOMINAL HYSTERECTOMY     TOTAL HIP REVISION Right 11/16/2019   Procedure: TOTAL HIP REVISION BIPOLAR TO CEMENTED BIPOLAR;  Surgeon: Marybelle Killings, MD;  Location: [facility];  Service: Orthopedics;  Laterality: Right;   WISDOM TOOTH EXTRACTION       Social History:   reports that she has never smoked. She has never used smokeless tobacco. She reports that she does not drink alcohol and does not use drugs.   Family History:  Her family history includes Atrial fibrillation in an other family member; Cancer in her father; Colon polyps in her sister; Emphysema in her brother; Hypertension in her mother; Kidney disease in her sister; Other in her father; Pancreatic cancer in her sister; Stroke in her mother.   Allergies Allergies  Allergen Reactions   Doxycycline Other (See Comments)    Rash on face and neck   Hydrocodone Other (See Comments)    Other reaction(s): hallucinations   Vicodin [Hydrocodone-Acetaminophen] Other (See Comments)    Hallucinations    Flagyl [Metronidazole] Nausea And Vomiting   Moxifloxacin Other (See Comments)     Weakness/fatigue   Pantoprazole Sodium Rash   Penicillins Rash    Has patient had a PCN reaction causing immediate rash, facial/tongue/throat swelling, SOB or lightheadedness with hypotension:unsure Has patient had a PCN reaction causing severe rash involving mucus membranes or skin necrosis:unsure Has patient had a PCN reaction that required hospitalization:No Has patient had a PCN reaction occurring within the last 10 years:Yes Cannot recall exact reaction due to time lapse If all of the above answers are "NO", then may proceed with Cephalosporin use.  Other  reaction(s): Unknown     Home Medications  Prior to Admission medications   Medication Sig Start Date End Date Taking? Authorizing Provider  acetaminophen (TYLENOL) 325 MG tablet Take 2 tablets (650 mg total) by mouth every 6 (six) hours as needed for mild pain (or Fever >/= 101). 03/01/20  Yes Angiulli, Lavon Paganini, PA-C  apixaban (ELIQUIS) 2.5 MG TABS tablet Take 1 tablet (2.5 mg total) by mouth 2 (two) times daily. 10/04/20  Yes Allred, Jeneen Rinks, MD  FLUoxetine (PROZAC) 20 MG capsule Take 20 mg by mouth daily. 08/22/20  Yes [provider]  furosemide (LASIX) 20 MG tablet Take 20 mg by mouth daily. 03/16/20  Yes [provider]  hydrOXYzine (ATARAX/VISTARIL) 25 MG tablet Take 12.5-25 mg by mouth every 8 (eight) hours as needed for itching. 10/07/20  Yes [provider]  levothyroxine (SYNTHROID) 50 MCG tablet Take 1 tablet (50 mcg total) by mouth daily. 03/01/20  Yes Angiulli, Lavon Paganini, PA-C  omeprazole (PRILOSEC) 40 MG capsule Take 1 capsule (40 mg total) by mouth 2 (two) times daily before a meal. 03/21/21  Yes Pyrtle, Lajuan Lines, MD  potassium chloride (KLOR-CON) 8 MEQ tablet Take 16 mEq by mouth daily. 11/28/20  Yes [provider]  pravastatin (PRAVACHOL) 40 MG tablet Take 1 tablet (40 mg total) by mouth at bedtime. 03/01/20  Yes Angiulli, Lavon Paganini, PA-C  psyllium (METAMUCIL) 58.6 % packet Take 1 packet by mouth daily as needed (constipation).   Yes [provider]  SYMBICORT 80-4.5 MCG/ACT inhaler INHALE 2 PUFFS INTO THE LUNGS TWICE DAILY Patient taking differently: Inhale 2 puffs into the lungs in the morning and at  bedtime. 05/05/21  Yes Brand Males, MD     Critical care time: NA      Baltazar Apo, MD, PhD 06/25/2021, 12:21 PM [facility] [phone] or if no answer before 7:00PM call [phone] For any issues after 7:00PM please call eLink [phone]

## 2021-06-26 DIAGNOSIS — J9621 Acute and chronic respiratory failure with hypoxia: Secondary | ICD-10-CM | POA: Diagnosis not present

## 2021-06-26 DIAGNOSIS — U071 COVID-19: Secondary | ICD-10-CM | POA: Diagnosis not present

## 2021-06-26 DIAGNOSIS — K229 Disease of esophagus, unspecified: Secondary | ICD-10-CM | POA: Diagnosis not present

## 2021-06-26 DIAGNOSIS — K220 Achalasia of cardia: Secondary | ICD-10-CM | POA: Diagnosis not present

## 2021-06-26 LAB — BASIC METABOLIC PANEL
Anion gap: 3 — ABNORMAL LOW (ref 5–15)
BUN: 17 mg/dL (ref 8–23)
CO2: 29 mmol/L (ref 22–32)
Calcium: 8.7 mg/dL — ABNORMAL LOW (ref 8.9–10.3)
Chloride: 101 mmol/L (ref 98–111)
Creatinine, Ser: 0.59 mg/dL (ref 0.44–1.00)
GFR, Estimated: >60 mL/min (ref >60)
Glucose, Bld: 121 mg/dL — ABNORMAL HIGH (ref 70–99)
Potassium: 4.4 mmol/L (ref 3.5–5.1)
Sodium: 133 mmol/L — ABNORMAL LOW (ref 135–145)

## 2021-06-26 LAB — CBC WITH DIFFERENTIAL/PLATELET
Abs Immature Granulocytes: 0.05 10*3/uL (ref 0.00–0.07)
Basophils Absolute: 0 10*3/uL (ref 0.0–0.1)
Basophils Relative: 0 %
Eosinophils Absolute: 0 10*3/uL (ref 0.0–0.5)
Eosinophils Relative: 0 %
HCT: 35.8 % — ABNORMAL LOW (ref 36.0–46.0)
Hemoglobin: 11 g/dL — ABNORMAL LOW (ref 12.0–15.0)
Immature Granulocytes: 1 %
Lymphocytes Relative: 12 %
Lymphs Abs: 1.1 10*3/uL (ref 0.7–4.0)
MCH: 25.5 pg — ABNORMAL LOW (ref 26.0–34.0)
MCHC: 30.7 g/dL (ref 30.0–36.0)
MCV: 83.1 fL (ref 80.0–100.0)
Monocytes Absolute: 0.5 10*3/uL (ref 0.1–1.0)
Monocytes Relative: 6 %
Neutro Abs: 7.6 10*3/uL (ref 1.7–7.7)
Neutrophils Relative %: 81 %
Platelets: 368 10*3/uL (ref 150–400)
RBC: 4.31 MIL/uL (ref 3.87–5.11)
RDW: 17.9 % — ABNORMAL HIGH (ref 11.5–15.5)
WBC: 9.3 10*3/uL (ref 4.0–10.5)
nRBC: 0 % (ref 0.0–0.2)

## 2021-06-26 LAB — MAGNESIUM: Magnesium: 2 mg/dL (ref 1.7–2.4)

## 2021-06-26 NOTE — TOC Progression Note (Signed)
Transition of Care [facility]) - Progression Note    Patient Details  Name: Stephanie Cortez MRN: [id number] Date of Birth: 08-18-1933  Transition of Care [facility]) CM/SW Contact  Ross Ludwig, [place] Phone Number: 06/26/2021, 6:05 PM  Clinical Narrative:     Updated clinicals sent to SNFs in [place], [place] awaiting for bed offers, patient will not be able to go to SNF until 12/28 which is when she will be 11 days post covid.    Expected Discharge Plan: [place] Barriers to Discharge: Continued Medical Work up  Expected Discharge Plan and Services Expected Discharge Plan: [place]   Discharge Planning Services: CM Consult Post Acute Care Choice: [place] Living arrangements for the past 2 months: Single Family Home                                       Social Determinants of Health (SDOH) Interventions    Readmission Risk Interventions Readmission Risk Prevention Plan 02/10/2020  Transportation Screening Complete  HRI or [place] Complete  Social Work Consult for [place] Planning/Counseling Complete  Palliative Care Screening Not Applicable  Medication Review Press photographer) Complete  Some recent data might be hidden

## 2021-06-26 NOTE — NC FL2 (Signed)
[place] MEDICAID FL2 LEVEL OF CARE SCREENING TOOL     IDENTIFICATION  Patient Name: Stephanie Cortez Birthdate: August 12, 1933 Sex: female Admission Date (Current Location): 06/17/2021  [facility] and [place] Number:  Herbalist and Address:  [facility],  [place], [place]      Provider Number: [id number]  Attending Physician Name and Address:  Dwyane Dee, MD  Relative Name and Phone Number:  [name] Daughter [phone]  [phone]  [name] [phone]    Talicia, Sui Spouse [phone]    Current Level of Care: Hospital Recommended Level of Care: [place] Prior Approval Number:    Date Approved/Denied:   PASRR Number: [id number]  Discharge Plan: SNF    Current Diagnoses: Patient Active Problem List   Diagnosis Date Noted   Cricopharyngeal achalasia 06/24/2021   Disorder of cervical esophageal region 06/23/2021   Pressure injury of skin 06/21/2021   Physical deconditioning 06/21/2021   SOB (shortness of breath) 06/18/2021   Acute on chronic respiratory failure with hypoxia ([place]) 06/18/2021   Chronic diastolic CHF (congestive heart failure) ([place]) 06/18/2021   Chronic anemia 06/18/2021   COVID-19 virus infection 06/17/2021   Chronic pain of right knee 06/20/2020   Abnormality of gait 06/20/2020   Malnutrition of moderate degree 02/25/2020   S/P percutaneous endoscopic gastrostomy (PEG) tube placement ([place])    Post-operative pain    Subtherapeutic international normalized ratio (INR)    Debility 02/17/2020   Mesenteric ischemia ([place])    Sepsis ([place]) 02/08/2020   Small bowel ischemia ([place]) 02/08/2020   Thrombocytosis 02/08/2020   Abnormal CT of the abdomen    Periprosthetic fracture around internal prosthetic hip joint 11/16/2019   Periprosthetic fracture around internal prosthetic hip joint, initial encounter    Chronic obstructive pulmonary disease (HCC)    Essential hypertension     Chronic anticoagulation    Acute blood loss anemia    Transaminitis    Hypoalbuminemia due to protein-calorie malnutrition ([place])    Labile blood pressure    Drug induced constipation    Postoperative pain    Subcapital fracture of femur ([place]) 10/30/2019   Post-operative state    Prerenal azotemia    Leukocytosis    Closed right hip fracture, initial encounter ([place]) 10/27/2019   Hoarseness 02/19/2018   Sensorineural hearing loss (SNHL) of both ears 02/19/2018   Tinnitus, bilateral 02/19/2018   Cough 09/06/2017   Cough variant asthma 08/19/2017   Acute bronchitis 02/20/2016   Pulmonary air trapping 01/04/2016   Encounter for preoperative pulmonary examination 01/04/2016   NSIP (nonspecific interstitial pneumonia) ([place]) 09/26/2015   Chronic sinusitis 09/26/2015   ILD (interstitial lung disease) ([place]) 07/12/2014   Upper airway cough syndrome 09/23/2013   Encounter for therapeutic drug monitoring 08/11/2013   Congestive heart failure ([place]) 07/08/2013   Coronary artery calcification seen on CAT scan 03/15/2013   Nodule of left lung 03/15/2013   Orthostatic lightheadedness 02/09/2013   Depression    Hyponatremia 04/03/2012   Diverticulitis 01/01/2012   GERD (gastroesophageal reflux disease) 01/01/2012   Hypothyroid 11/23/2010   Edema 10/04/2010   Bronchiectasis ([place])    HYPERTENSION, BENIGN 10/22/2008   A-fib (HCC)     Orientation RESPIRATION BLADDER Height & Weight     Self, Situation, Place  Normal External catheter Weight: 138 lb 14.2 oz (63 kg) Height:  5\' 7"  (170.2 cm)  BEHAVIORAL SYMPTOMS/MOOD NEUROLOGICAL BOWEL NUTRITION STATUS      Continent Diet (Mechanical soft)  AMBULATORY STATUS COMMUNICATION  OF NEEDS Skin   Limited Assist Verbally Normal, PU Stage and Appropriate Care                       Personal Care Assistance Level of Assistance  Bathing, Feeding, Dressing Bathing Assistance: Limited assistance Feeding assistance: Independent Dressing Assistance:  Limited assistance     Functional Limitations Info  Sight, Hearing, Speech Sight Info: Adequate Hearing Info: Adequate Speech Info: Adequate    SPECIAL CARE FACTORS FREQUENCY  PT (By licensed PT), OT (By licensed OT)     PT Frequency: Minimum 5 x week OT Frequency: Minimum 5 x week            Contractures Contractures Info: Not present    Additional Factors Info  Code Status, Allergies, Psychotropic, Isolation Precautions Code Status Info: full Allergies Info: Doxycycline   Hydrocodone   Vicodin (Hydrocodone-acetaminophen)   Flagyl (Metronidazole)   Moxifloxacin   Pantoprazole Sodium   Penicillins Psychotropic Info: LORazepam (ATIVAN) injection 0.25 mg   Isolation Precautions Info: Covid air/con precautions     Current Medications (06/26/2021):  This is the current hospital active medication list Current Facility-Administered Medications  Medication Dose Route Frequency Provider Last Rate Last Admin   acetaminophen (TYLENOL) tablet 650 mg  650 mg Oral Q6H PRN Howerter, Justin B, DO   650 mg at 06/24/21 2333   Or   acetaminophen (TYLENOL) suppository 650 mg  650 mg Rectal Q6H PRN Howerter, Justin B, DO       albuterol (VENTOLIN HFA) 108 (90 Base) MCG/ACT inhaler 1-2 puff  1-2 puff Inhalation Q4H PRN Howerter, Justin B, DO   2 puff at 06/22/21 1615   apixaban (ELIQUIS) tablet 2.5 mg  2.5 mg Oral BID Howerter, Justin B, DO   2.5 mg at 06/26/21 1126   furosemide (LASIX) tablet 20 mg  20 mg Oral Daily Howerter, Justin B, DO   20 mg at 06/26/21 1125   hydrOXYzine (ATARAX) tablet 12.5 mg  12.5 mg Oral Q8H PRN Howerter, Justin B, DO   12.5 mg at 06/26/21 1125   Ipratropium-Albuterol (COMBIVENT) respimat 1 puff  1 puff Inhalation TID Dwyane Dee, MD   1 puff at 06/26/21 1401   levothyroxine (SYNTHROID) tablet 50 mcg  50 mcg Oral Daily Howerter, Justin B, DO   50 mcg at 06/26/21 1125   LORazepam (ATIVAN) injection 0.25 mg  0.25 mg Intravenous Once Kathryne Eriksson, NP        ondansetron [facility]) injection 4 mg  4 mg Intravenous Q6H PRN Dwyane Dee, MD   4 mg at 06/26/21 0605   pravastatin (PRAVACHOL) tablet 40 mg  40 mg Oral QHS Howerter, Justin B, DO   40 mg at 06/25/21 2114   predniSONE (DELTASONE) tablet 50 mg  50 mg Oral Daily Howerter, Justin B, DO   50 mg at 06/26/21 1125   sodium chloride tablet 1 g  1 g Oral TID WC Hall, Carole N, DO   1 g at 06/26/21 1401     Discharge Medications: Please see discharge summary for a list of discharge medications.  Relevant Imaging Results:  Relevant Lab Results:   Additional Information MNO:[id number]  Ross Ludwig, LCSW

## 2021-06-26 NOTE — Progress Notes (Signed)
Physical Therapy Treatment Patient Details Name: Stephanie Cortez MRN: [id number] DOB: 04-09-1934 Today's Date: 06/26/2021   History of Present Illness 87 y.o. female admitted with COVID Pna, sepsis, after falling at home.  history significant for chronic atrial fibrillation on Eliquis, CHF with preserved EF, COPD, hyperlipidemia, chronic hyponatremia, acquired hypothyroidism, who presented to [facility] ED from home with complaints of progressively worsening shortness of breath.    PT Comments    Pt AxO x 2 not fully aware of her medical status but following all commands.  Assisted OOb to amb to bathroom was difficult.  General transfer comment: severe posteruior lean/rigid with poor self correction.  Pt admits to having a LIFT CHAIR at home. General Gait Details: severe posterior lean with inability to flex at hips.  Asssisted onto toilet in bathroom, Therapist guided hips downward as pt continued to lean back.  Then pt required increased to rise from toilet.  Poor balance with little to no corrective reaction responce.  X 3 posterior LOB during session in bathroom in which Therapist corrected.  HIGH FALL RISK.  Assisted with peri care and washing hands at sink.  Pt moving slowly.  Assisted to recliner and positioned to comfort.  Pt will need ST Rehab at SNF.   Recommendations for follow up therapy are one component of a multi-disciplinary discharge planning process, led by the attending physician.  Recommendations may be updated based on patient status, additional functional criteria and insurance authorization.  Follow Up Recommendations  Skilled nursing-short term rehab (<3 hours/day)     Assistance Recommended at Discharge Frequent or constant Supervision/Assistance  Equipment Recommendations  None recommended by PT    Recommendations for Other Services       Precautions / Restrictions Precautions Precautions: Fall Precaution Comments: monitor O2 Restrictions Weight Bearing  Restrictions: No     Mobility  Bed Mobility   Bed Mobility: Supine to Sit     Supine to sit: Mod assist;Max assist     General bed mobility comments: Assist for trunk and LEs. Increased time. Cues provided.    Transfers Overall transfer level: Needs assistance Equipment used: Rolling walker (2 wheels) Transfers: Sit to/from Stand Sit to Stand: Mod assist;Max assist           General transfer comment: severe posteruior lean/rigid with poor self correction.  Pt admits to having a LIFT CHAIR at home.    Ambulation/Gait Ambulation/Gait assistance: Mod assist Gait Distance (Feet): 16 Feet (8 feet x 2 to and from bathroom) Assistive device: Rolling walker (2 wheels) Gait Pattern/deviations: Step-through pattern;Decreased stride length;Trunk flexed Gait velocity: decreased     General Gait Details: severe posterior lean with inability to flex at hips.  Asssisted onto toilet in bathroom, Therapist guided hips downward as pt continued to lean back.  Then pt required increased to rise from toilet.  Poor balance with little to no corrective reaction responce.  X 3 posterior LOB during session in bathroom in which Therapist corrected.  HIGH FALL RISK.   Stairs             Wheelchair Mobility    Modified Rankin (Stroke Patients Only)       Balance                                            Cognition Arousal/Alertness: Awake/alert Behavior During Therapy: WFL for tasks assessed/performed Overall Cognitive  Status: Within Functional Limits for tasks assessed                                 General Comments: AxO x 2 but also not fully aware of her defecipts.        Exercises      General Comments        Pertinent Vitals/Pain Pain Assessment: No/denies pain    Home Living                          Prior Function            PT Goals (current goals can now be found in the care plan section) Progress towards PT  goals: Progressing toward goals    Frequency    Min 3X/week      PT Plan Current plan remains appropriate    Co-evaluation              AM-PAC PT "6 Clicks" Mobility   Outcome Measure  Help needed turning from your back to your side while in a flat bed without using bedrails?: A Lot Help needed moving from lying on your back to sitting on the side of a flat bed without using bedrails?: A Lot Help needed moving to and from a bed to a chair (including a wheelchair)?: A Lot Help needed standing up from a chair using your arms (e.g., wheelchair or bedside chair)?: A Lot Help needed to walk in hospital room?: A Lot Help needed climbing 3-5 steps with a railing? : Total 6 Click Score: 11    End of Session Equipment Utilized During Treatment: Gait belt Activity Tolerance: Patient limited by fatigue Patient left: in chair;with call bell/phone within reach;with chair alarm set Nurse Communication: Mobility status PT Visit Diagnosis: Muscle weakness (generalized) (M62.81);History of falling (Z91.81);Unsteadiness on feet (R26.81);Difficulty in walking, not elsewhere classified (R26.2)     Time: 7416-3845 PT Time Calculation (min) (ACUTE ONLY): 28 min  Charges:  $Gait Training: 8-22 mins $Therapeutic Activity: 8-22 mins                     Rica Koyanagi  PTA Acute  Rehabilitation Services Pager      [phone] Office      [phone]

## 2021-06-26 NOTE — Progress Notes (Signed)
Failing swallow evaluation. Every time she takes a drink of thin liquids or a bite of applesauce w/ crushed meds per her diet order, she is choking and coughing and vomitining directly after or shortly after. She states , "It feels like I have something in my throat." Will keep her NPO for now until further evaluation can be done.  Continuing to monitor. HOB greater than 30 degrees. Zofran given IV per order for nausea. Call light in reach.

## 2021-06-26 NOTE — Progress Notes (Signed)
Progress Note    Stephanie Cortez   ZES:[id number]  DOB: [birth year]  DOA: 06/17/2021     9 PCP: [name], MD  Initial CC: SOB  Hospital Course: Stephanie Cortez is an 87 y.o. female with PMH chronic atrial fibrillation on Eliquis, CHF with preserved EF, COPD, HLD, chronic hyponatremia, acquired hypothyroidism, who presented to [facility] ED from home with complaints of progressively worsening shortness of breath with associated new onset productive cough.   Work up revealed covid-19 viral pneumonia with concern for coinfection with bacterial pneumonia.  Started on antiviral Remdesivir, IV steroids, bronchodialators.  IV Rocephin and azithromycin were added.  She was weaned off oxygen supplementation on 06/19/2021.  Interval History:  Patient developed difficulty with swallowing since yesterday.  She developed vomiting with liquids and solids.  This morning we discussed further options however she is declining transitioning to a pured diet.  She is also declining EGD still at this time due to the risk associated with anesthesia and the possibility that she may remain on the vent for a prolonged or unknown amount of time.  Assessment & Plan: * COVID-19 virus infection- (present on admission) - given underlying lung disease patient is high risk for progression to severe disease - s/p remdesivir - continue steroids to complete course - continue IS and flutter  - continue OOB with PT/OT - plan is for d/c to SNF after isolation period  Cricopharyngeal achalasia - Previously treated with Botox and has followed outpatient with GI in the past - Underwent barium swallow on 06/23/2021 which showed moderate esophageal dysmotility with transient/intermittent passage of contrast through the GE junction suggesting mild achalasia - continued discussions held with patient; overall she is wishing to remain conservative at this time and try to avoid anesthesia as she's worried about the risks associated with  such in setting of her lung disease - for now continue dys 3 diet; she was offered puree diet but declined for now stating she'd just not eat then. We also discussed that she needs to think of backup options, etc if she cannot tolerate eating  Acute on chronic respiratory failure with hypoxia (Stephanie Cortez)- (present on admission) - on 2L nocturnal O2 at home - currently on RA at rest   Disorder of cervical esophageal region - Evaluated by SLP.  Concern for cervical esophageal dysphagia.  She had cervical web and prominent CP noted in 2013 with ongoing symptoms currently - Esophagram reveals ongoing achalasia.  Appreciate pulmonology evaluation; she is moderate to high risk for undergoing EGD (she will need to really talk with her family about her wishes going forward and she understands the gravity of not eating vs risking prolonged ventilation, etc)  Physical deconditioning - evaluated by PT/OT - plan is for SNF once isolation period expires; diagnosed 12/27 with Covid  Chronic diastolic CHF (congestive heart failure) ([place])- (present on admission) - no s/s exacerbation  -Continue Lasix  Hyponatremia- (present on admission) - from poor oral intake on admission  Severe sepsis (HCC)-resolved as of 06/21/2021, (present on admission) - tachycardia, neutropenia, hypoxia and pulm source - see covid   Chronic anemia- (present on admission) - Baseline hemoglobin around 10 to 11 g/dL - Currently at baseline  Hypothyroid- (present on admission) - Continue Synthroid  A-fib ([place])- (present on admission) - Continue Eliquis    Old records reviewed in assessment of this patient  Antimicrobials: Remdesivir 06/17/2021 >> 06/21/21  DVT prophylaxis: Eliquis  Code Status:   Code Status: Full Code  Disposition Plan:  SNF Status is: Inpt  Objective: Blood pressure 128/89, pulse 87, temperature (!) 97.4 F (36.3 C), temperature source Oral, resp. rate 19, height 5\' 7"  (1.702 m), weight 63 kg,  SpO2 96 %.  Examination:  Physical Exam Constitutional:      Appearance: Normal appearance.  HENT:     Head: Normocephalic and atraumatic.     Mouth/Throat:     Mouth: Mucous membranes are moist.  Eyes:     Extraocular Movements: Extraocular movements intact.  Cardiovascular:     Pulses: Normal pulses.     Heart sounds: Normal heart sounds.  Pulmonary:     Comments: Coarse breath sounds bilaterally at baseline Abdominal:     General: Bowel sounds are normal. There is no distension.     Palpations: Abdomen is soft.     Tenderness: There is no abdominal tenderness.  Musculoskeletal:        General: Normal range of motion.     Cervical back: Normal range of motion and neck supple.  Skin:    General: Skin is warm and dry.  Neurological:     General: No focal deficit present.     Mental Status: She is alert and oriented to person, place, and time.  Psychiatric:        Mood and Affect: Mood normal.        Behavior: Behavior normal.     Consultants:  Gastroenterology Pulmonology  Procedures:    Data Reviewed: I have personally reviewed labs and imaging studies    LOS: 9 days  Time spent: Greater than 50% of the 35 minute visit was spent in counseling/coordination of care for the patient as laid out in the A&P.   Dwyane Dee, MD [facility] 06/26/2021, 2:42 PM

## 2021-06-27 DIAGNOSIS — J9621 Acute and chronic respiratory failure with hypoxia: Secondary | ICD-10-CM | POA: Diagnosis not present

## 2021-06-27 DIAGNOSIS — K229 Disease of esophagus, unspecified: Secondary | ICD-10-CM | POA: Diagnosis not present

## 2021-06-27 DIAGNOSIS — U071 COVID-19: Secondary | ICD-10-CM | POA: Diagnosis not present

## 2021-06-27 DIAGNOSIS — K220 Achalasia of cardia: Secondary | ICD-10-CM | POA: Diagnosis not present

## 2021-06-27 LAB — CBC WITH DIFFERENTIAL/PLATELET
Abs Immature Granulocytes: 0.05 10*3/uL (ref 0.00–0.07)
Basophils Absolute: 0 10*3/uL (ref 0.0–0.1)
Basophils Relative: 0 %
Eosinophils Absolute: 0 10*3/uL (ref 0.0–0.5)
Eosinophils Relative: 0 %
HCT: 37.4 % (ref 36.0–46.0)
Hemoglobin: 11.4 g/dL — ABNORMAL LOW (ref 12.0–15.0)
Immature Granulocytes: 0 %
Lymphocytes Relative: 12 %
Lymphs Abs: 1.5 10*3/uL (ref 0.7–4.0)
MCH: 25.6 pg — ABNORMAL LOW (ref 26.0–34.0)
MCHC: 30.5 g/dL (ref 30.0–36.0)
MCV: 84 fL (ref 80.0–100.0)
Monocytes Absolute: 0.8 10*3/uL (ref 0.1–1.0)
Monocytes Relative: 7 %
Neutro Abs: 9.5 10*3/uL — ABNORMAL HIGH (ref 1.7–7.7)
Neutrophils Relative %: 81 %
Platelets: 375 10*3/uL (ref 150–400)
RBC: 4.45 MIL/uL (ref 3.87–5.11)
RDW: 18.1 % — ABNORMAL HIGH (ref 11.5–15.5)
WBC: 11.8 10*3/uL — ABNORMAL HIGH (ref 4.0–10.5)
nRBC: 0 % (ref 0.0–0.2)

## 2021-06-27 LAB — BASIC METABOLIC PANEL
Anion gap: 6 (ref 5–15)
BUN: 28 mg/dL — ABNORMAL HIGH (ref 8–23)
CO2: 31 mmol/L (ref 22–32)
Calcium: 9.2 mg/dL (ref 8.9–10.3)
Chloride: 99 mmol/L (ref 98–111)
Creatinine, Ser: 0.61 mg/dL (ref 0.44–1.00)
GFR, Estimated: >60 mL/min (ref >60)
Glucose, Bld: 122 mg/dL — ABNORMAL HIGH (ref 70–99)
Potassium: 4.9 mmol/L (ref 3.5–5.1)
Sodium: 136 mmol/L (ref 135–145)

## 2021-06-27 LAB — MAGNESIUM: Magnesium: 2.4 mg/dL (ref 1.7–2.4)

## 2021-06-27 NOTE — Care Management Important Message (Signed)
Important Message  Patient Details IM Letter IM Letter placed in Patients room. Name: Stephanie Cortez MRN: [id number] Date of Birth: [birth year]   Medicare Important Message Given:  Yes     Kerin Salen 06/27/2021, 12:21 PM

## 2021-06-27 NOTE — TOC Progression Note (Addendum)
Transition of Care [facility]) - Progression Note    Patient Details  Name: MYLIN GIGNAC MRN: [id number] Date of Birth: Mar 19, 1934  Transition of Care [facility]) CM/SW Contact  Ross Ludwig, [place] Phone Number: 06/27/2021, 1:04 PM  Clinical Narrative:     CSW spoke to [facility] and they can accept patient tomorrow as long as she is medically ready for discharge.  CSW spoke to patient's daughter [name], and confirmed bed choice.  Patient's daughter requested an update from physician, [place] updated attending physician and bedside nurse that patient has a bed available tomorrow and would like an update.    Due to patient being Covid+ CSW attempted to update patient about bed availability at [facility], tried calling in room via phone, she did not answer.  CSW to continue to follow patient's progress throughout discharge planning.   Expected Discharge Plan: [place] Barriers to Discharge: Continued Medical Work up  Expected Discharge Plan and Services Expected Discharge Plan: [place]   Discharge Planning Services: CM Consult Post Acute Care Choice: [place] Living arrangements for the past 2 months: Single Family Home                                       Social Determinants of Health (SDOH) Interventions    Readmission Risk Interventions Readmission Risk Prevention Plan 02/10/2020  Transportation Screening Complete  HRI or [place] Complete  Social Work Consult for [place] Planning/Counseling Complete  Palliative Care Screening Not Applicable  Medication Review Press photographer) Complete  Some recent data might be hidden

## 2021-06-27 NOTE — Progress Notes (Signed)
Speech Language Pathology Treatment: Dysphagia  Patient Details Name: Stephanie Cortez MRN: [id number] DOB: 05/29/1934 Today's Date: 06/27/2021 Time: 6734-1937 SLP Time Calculation (min) (ACUTE ONLY): 12 min  Assessment / Plan / Recommendation Clinical Impression  F/u for dysphagia - pt reports improved comfort with eating today and desire to f/u with her GI group s/p D/C. We reviewed the basics of her esophagram/barium swallow, which showed mod esophageal dysmotility and retention of POs in the esophagus with transient passage through gastro-esophageal junction. We talked about what those results mean functionally - occasional backflow/regurgitation when there is not enough room in the esophagus to accommodate more food/liquids; early satiety/feeling "full." We talked about snacking throughout the day rather than attempting to eat three full meals; eating slowly; avoiding PO intake two hours before bedtime; elevating head-of-bed six inches when sleeping. Stephanie Cortez verbalized understanding but it was difficult to tell how much of our conversation she retained.    Called pt's daughter, Stephanie Cortez, to share with her the discussion I had with her mother.  We reviewed the information stated above.  We talked about potential for backflow of food/liquid to be aspirated and risk in the future of aspiration pneumonia.  We discussed tube feeding and the burdens associated with their use with this diagnosis. We discussed staying on current diet so that Stephanie Cortez has more options and can choose foods from which she derives pleasure. It's unlikely pureed food will offer any benefit over solids.  If food is moist and is chewed thoroughly, its consistency will resemble purees once it reaches the esophagus.   Stephanie Cortez verbalized understanding of our conversation.  Education complete. No further acute care SLP f/u is recommended. Our service will sign off.   HPI HPI: 87 y.o. female admitted with COVID Pna, sepsis,  after falling at home.  History significant for chronic atrial fibrillation on Eliquis, CHF with preserved EF, COPD, hyperlipidemia, chronic hyponatremia, acquired hypothyroidism, known hx esophageal dysfunction. Esophagram 12/23: Moderate esophageal dysmotility, with only transient/intermittent passage of contrast via the GE junction, confirming patency but suggesting mild achalasia; narrowing/stenosis at GE junction is possible.      SLP Plan  All goals met      Recommendations for follow up therapy are one component of a multi-disciplinary discharge planning process, led by the attending physician.  Recommendations may be updated based on patient status, additional functional criteria and insurance authorization.    Recommendations  Diet recommendations: Regular;Thin liquid Liquids provided via: Cup;Straw Medication Administration: Crushed with puree Supervision: Patient able to self feed Compensations: Slow rate;Small sips/bites;Follow solids with liquid Postural Changes and/or Swallow Maneuvers: Seated upright 90 degrees;Upright 30-60 min after meal                Oral Care Recommendations: Oral care BID Follow Up Recommendations: No SLP follow up SLP Visit Diagnosis: Dysphagia, pharyngoesophageal phase (R13.14);Dysphagia, unspecified (R13.10) Plan: All goals met         Stephanie Cortez L. Tivis Ringer, [place] CCC/SLP Acute Rehabilitation Services Office number [phone] Pager [phone]   Stephanie Cortez  06/27/2021, 2:50 PM

## 2021-06-27 NOTE — Telephone Encounter (Signed)
Reviewed patient's chart. She is still currently admitted. I looked at MR's schedule and he is completely booked. I did notice that she was already scheduled for a follow up with MR on 07/13/21. Will keep this appt as it is the soonest with MR.

## 2021-06-27 NOTE — Progress Notes (Signed)
Progress Note    Stephanie Cortez   BJS:[id number]  DOB: [birth year]  DOA: 06/17/2021     10 PCP: [name], MD  Initial CC: SOB  Hospital Course: Stephanie Cortez is an 87 y.o. female with PMH chronic atrial fibrillation on Eliquis, CHF with preserved EF, COPD, HLD, chronic hyponatremia, acquired hypothyroidism, who presented to [facility] ED from home with complaints of progressively worsening shortness of breath with associated new onset productive cough.   Work up revealed covid-19 viral pneumonia with concern for coinfection with bacterial pneumonia.  Started on antiviral Remdesivir, IV steroids, bronchodialators.  IV Rocephin and azithromycin were added.  She was weaned off oxygen supplementation on 06/19/2021.  Interval History:  No events overnight.  This morning she is tolerating oral intake better and states that she was able to eat breakfast with much less difficulty compared to yesterday morning. Denied any nausea, vomiting, gagging when eating this morning. Discussed that she is stable for discharging to rehab once we find a bed.  After progression rounds, she has been accepted to [facility] and we will tentatively plan for discharging there on Wednesday.  Called and updated her daughter, Stephanie Cortez, this afternoon and we reviewed plan of care as outlined in A&P below.   Assessment & Plan: * COVID-19 virus infection- (present on admission) - given underlying lung disease patient is high risk for progression to severe disease - s/p remdesivir - 10 days steroid course completed on 12/27; respiratory status baseline - continue IS and flutter - d/c isolation precautions on 12/28; medically stable for d/c to [facility] on 12/28  Cricopharyngeal achalasia - Previously treated with Botox and has followed outpatient with GI in the past, Dr. Hilarie Fredrickson (outpatient followup to be arranged after discharge, discussed with her daughter as well) - Underwent barium swallow on 06/23/2021 which  showed moderate esophageal dysmotility with transient/intermittent passage of contrast through the GE junction suggesting mild achalasia - continued discussions held with patient; overall she is wishing to remain conservative at this time and try to avoid anesthesia as she's worried about the risks associated with such in setting of her lung disease (this is not unreasonable and worth continuing to try).  - we will continue on Dysphagia level 3 diet at discharge to [facility]; patient did not wish to attempt puree diet (although may still have to try this in the future if/when symptoms recur) vs short NPO period until able to swallow again. - if prolonged NPO, then patient will need to decide if she wishes to undergo EGD vs consideration of PEG placement for TF (which she can still eat with PEG in place, but at least PEG would provide adequate nutrition)  - I've discussed the above with her daughter and son in law who will continue discussions with patient at discharge as well   Acute on chronic respiratory failure with hypoxia ([place])- (present on admission) - on 2L nocturnal O2 at home - currently on RA at rest   Disorder of cervical esophageal region - Evaluated by SLP.  Concern for cervical esophageal dysphagia.  She had cervical web and prominent CP noted in 2013 with ongoing symptoms currently - Esophagram reveals ongoing achalasia.  Appreciate pulmonology evaluation; she is moderate to high risk for undergoing EGD (see discussion under achalasia as well). Future need for anesthesia will be a risk/benefit conversation and patient does show understanding of the risks/benefits. For now, her goal is conservative measures   Physical deconditioning - evaluated by PT/OT - plan is for  d/c to [facility] on 12/28; patient is aware   Chronic diastolic CHF (congestive heart failure) ([place])- (present on admission) - no s/s exacerbation  -Continue Lasix  Severe sepsis (HCC)-resolved as of 06/21/2021, (present on  admission) - tachycardia, neutropenia, hypoxia and pulm source - see covid   Hyponatremia-resolved as of 06/27/2021, (present on admission) - from poor oral intake on admission  Chronic anemia- (present on admission) - Baseline hemoglobin around 10 to 11 g/dL - Currently at baseline  Hypothyroid- (present on admission) - Continue Synthroid  A-fib ([place])- (present on admission) - Continue Eliquis    Old records reviewed in assessment of this patient  Antimicrobials: Remdesivir 06/17/2021 >> 06/21/21  DVT prophylaxis: Eliquis  Code Status:   Code Status: Full Code  Disposition Plan:  SNF - [facility] on 06/28/21 Status is: Inpt  Objective: Blood pressure (!) 119/58, pulse 60, temperature 98.2 F (36.8 C), temperature source Oral, resp. rate 16, height 5\' 7"  (1.702 m), weight 63.8 kg, SpO2 100 %.  Examination:  Physical Exam Constitutional:      Appearance: Normal appearance.  HENT:     Head: Normocephalic and atraumatic.     Mouth/Throat:     Mouth: Mucous membranes are moist.  Eyes:     Extraocular Movements: Extraocular movements intact.  Cardiovascular:     Pulses: Normal pulses.     Heart sounds: Normal heart sounds.  Pulmonary:     Comments: Coarse breath sounds bilaterally at baseline Abdominal:     General: Bowel sounds are normal. There is no distension.     Palpations: Abdomen is soft.     Tenderness: There is no abdominal tenderness.  Musculoskeletal:        General: Normal range of motion.     Cervical back: Normal range of motion and neck supple.  Skin:    General: Skin is warm and dry.  Neurological:     General: No focal deficit present.     Mental Status: She is alert and oriented to person, place, and time.  Psychiatric:        Mood and Affect: Mood normal.        Behavior: Behavior normal.     Consultants:  Gastroenterology Pulmonology  Procedures:    Data Reviewed: I have personally reviewed labs and imaging studies    LOS: 10  days  Time spent: Greater than 50% of the 35 minute visit was spent in counseling/coordination of care for the patient as laid out in the A&P.   Dwyane Dee, MD [facility] 06/27/2021, 2:05 PM

## 2021-06-28 DIAGNOSIS — K229 Disease of esophagus, unspecified: Secondary | ICD-10-CM | POA: Diagnosis not present

## 2021-06-28 DIAGNOSIS — J9600 Acute respiratory failure, unspecified whether with hypoxia or hypercapnia: Secondary | ICD-10-CM | POA: Diagnosis not present

## 2021-06-28 DIAGNOSIS — Z96641 Presence of right artificial hip joint: Secondary | ICD-10-CM | POA: Diagnosis not present

## 2021-06-28 DIAGNOSIS — I509 Heart failure, unspecified: Secondary | ICD-10-CM | POA: Diagnosis not present

## 2021-06-28 DIAGNOSIS — U071 COVID-19: Secondary | ICD-10-CM | POA: Diagnosis not present

## 2021-06-28 DIAGNOSIS — I110 Hypertensive heart disease with heart failure: Secondary | ICD-10-CM | POA: Diagnosis not present

## 2021-06-28 DIAGNOSIS — Z743 Need for continuous supervision: Secondary | ICD-10-CM | POA: Diagnosis not present

## 2021-06-28 DIAGNOSIS — J129 Viral pneumonia, unspecified: Secondary | ICD-10-CM | POA: Diagnosis not present

## 2021-06-28 DIAGNOSIS — D649 Anemia, unspecified: Secondary | ICD-10-CM | POA: Diagnosis not present

## 2021-06-28 DIAGNOSIS — R52 Pain, unspecified: Secondary | ICD-10-CM | POA: Diagnosis not present

## 2021-06-28 DIAGNOSIS — I5032 Chronic diastolic (congestive) heart failure: Secondary | ICD-10-CM | POA: Diagnosis not present

## 2021-06-28 DIAGNOSIS — D6869 Other thrombophilia: Secondary | ICD-10-CM | POA: Diagnosis not present

## 2021-06-28 DIAGNOSIS — J449 Chronic obstructive pulmonary disease, unspecified: Secondary | ICD-10-CM | POA: Diagnosis not present

## 2021-06-28 DIAGNOSIS — I4891 Unspecified atrial fibrillation: Secondary | ICD-10-CM | POA: Diagnosis not present

## 2021-06-28 DIAGNOSIS — E871 Hypo-osmolality and hyponatremia: Secondary | ICD-10-CM | POA: Diagnosis not present

## 2021-06-28 DIAGNOSIS — K219 Gastro-esophageal reflux disease without esophagitis: Secondary | ICD-10-CM | POA: Diagnosis not present

## 2021-06-28 DIAGNOSIS — K589 Irritable bowel syndrome without diarrhea: Secondary | ICD-10-CM | POA: Diagnosis not present

## 2021-06-28 DIAGNOSIS — E785 Hyperlipidemia, unspecified: Secondary | ICD-10-CM | POA: Diagnosis not present

## 2021-06-28 DIAGNOSIS — Z20822 Contact with and (suspected) exposure to covid-19: Secondary | ICD-10-CM | POA: Diagnosis not present

## 2021-06-28 DIAGNOSIS — E039 Hypothyroidism, unspecified: Secondary | ICD-10-CM | POA: Diagnosis not present

## 2021-06-28 DIAGNOSIS — R54 Age-related physical debility: Secondary | ICD-10-CM | POA: Diagnosis not present

## 2021-06-28 DIAGNOSIS — K5790 Diverticulosis of intestine, part unspecified, without perforation or abscess without bleeding: Secondary | ICD-10-CM | POA: Diagnosis not present

## 2021-06-28 DIAGNOSIS — J9621 Acute and chronic respiratory failure with hypoxia: Secondary | ICD-10-CM | POA: Diagnosis not present

## 2021-06-28 DIAGNOSIS — R5381 Other malaise: Secondary | ICD-10-CM | POA: Diagnosis not present

## 2021-06-28 DIAGNOSIS — F32A Depression, unspecified: Secondary | ICD-10-CM | POA: Diagnosis not present

## 2021-06-28 DIAGNOSIS — I482 Chronic atrial fibrillation, unspecified: Secondary | ICD-10-CM | POA: Diagnosis not present

## 2021-06-28 DIAGNOSIS — I10 Essential (primary) hypertension: Secondary | ICD-10-CM | POA: Diagnosis not present

## 2021-06-28 DIAGNOSIS — R6520 Severe sepsis without septic shock: Secondary | ICD-10-CM | POA: Diagnosis not present

## 2021-06-28 DIAGNOSIS — K220 Achalasia of cardia: Secondary | ICD-10-CM | POA: Diagnosis not present

## 2021-06-28 DIAGNOSIS — L299 Pruritus, unspecified: Secondary | ICD-10-CM | POA: Diagnosis not present

## 2021-06-28 DIAGNOSIS — R0602 Shortness of breath: Secondary | ICD-10-CM | POA: Diagnosis not present

## 2021-06-28 DIAGNOSIS — R531 Weakness: Secondary | ICD-10-CM | POA: Diagnosis not present

## 2021-06-28 DIAGNOSIS — R1314 Dysphagia, pharyngoesophageal phase: Secondary | ICD-10-CM | POA: Diagnosis not present

## 2021-06-28 DIAGNOSIS — J479 Bronchiectasis, uncomplicated: Secondary | ICD-10-CM | POA: Diagnosis not present

## 2021-06-28 DIAGNOSIS — Z7901 Long term (current) use of anticoagulants: Secondary | ICD-10-CM | POA: Diagnosis not present

## 2021-06-28 DIAGNOSIS — K5900 Constipation, unspecified: Secondary | ICD-10-CM | POA: Diagnosis not present

## 2021-06-28 NOTE — TOC Progression Note (Signed)
Transition of Care [facility]) - Progression Note    Patient Details  Name: MYKAYLAH BALLMAN MRN: [id number] Date of Birth: 1934-02-13  Transition of Care [facility]) CM/SW Contact  Purcell Mouton, RN Phone Number: 06/28/2021, 12:21 PM  Clinical Narrative:    Corey Harold was called pt will discharge to [facility].    Expected Discharge Plan: [place] Barriers to Discharge: Continued Medical Work up  Expected Discharge Plan and Services Expected Discharge Plan: [place]   Discharge Planning Services: CM Consult Post Acute Care Choice: [place] Living arrangements for the past 2 months: Single Family Home Expected Discharge Date: 06/28/21                                     Social Determinants of Health (SDOH) Interventions    Readmission Risk Interventions Readmission Risk Prevention Plan 02/10/2020  Transportation Screening Complete  HRI or Home Care Consult Complete  Social Work Consult for [place] Planning/Counseling Complete  Palliative Care Screening Not Applicable  Medication Review Press photographer) Complete  Some recent data might be hidden

## 2021-06-28 NOTE — Plan of Care (Signed)
  Problem: Health Behavior/Discharge Planning: Goal: Ability to manage health-related needs will improve Outcome: Progressing   Problem: Nutrition: Goal: Adequate nutrition will be maintained Outcome: Progressing   Problem: Coping: Goal: Level of anxiety will decrease Outcome: Progressing   

## 2021-06-28 NOTE — Discharge Summary (Signed)
Physician Discharge Summary   Stephanie Cortez BMW:[id number] DOB: [birth year] DOA: 06/17/2021  PCP: [name], MD  Admit date: 06/17/2021 Discharge date:  06/28/2021  Admitted From: home Disposition:  [facility] Discharging physician: Dwyane Dee, MD  Recommendations for Outpatient Follow-up:  Follow up with Dr. Hilarie Fredrickson after discharge from rehab Follow up with Dr. Chase Caller after discharge from rehab   Discharge Condition: stable CODE STATUS: Full Diet recommendation:  Diet Orders (From admission, onward)     Start     Ordered   06/24/21 1103  DIET DYS 3 Room service appropriate? Yes; Fluid consistency: Thin  Diet effective now       Question Answer Comment  Room service appropriate? Yes   Fluid consistency: Thin      06/24/21 1103            Hospital Course: Stephanie Cortez is an 87 y.o. female with PMH chronic atrial fibrillation on Eliquis, CHF with preserved EF, COPD, HLD, chronic hyponatremia, acquired hypothyroidism, who presented to [facility] ED from home with complaints of progressively worsening shortness of breath with associated new onset productive cough.   Work up revealed covid-19 viral pneumonia with concern for coinfection with bacterial pneumonia.  Started on antiviral Remdesivir, IV steroids, bronchodialators.  IV Rocephin and azithromycin were added.  She was weaned off oxygen supplementation on 06/19/2021. See below for further A&P.   * COVID-19 virus infection- (present on admission) - given underlying lung disease patient is high risk for progression to severe disease - s/p remdesivir - 10 days steroid course completed on 12/27; respiratory status baseline - continue IS and flutter - d/c isolation precautions on 12/28; medically stable for d/c to [facility] on 12/28  Cricopharyngeal achalasia - Previously treated with Botox and has followed outpatient with GI in the past, Dr. Hilarie Fredrickson (outpatient followup to be arranged after discharge, discussed with her  daughter as well) - Underwent barium swallow on 06/23/2021 which showed moderate esophageal dysmotility with transient/intermittent passage of contrast through the GE junction suggesting mild achalasia - continued discussions held with patient; overall she is wishing to remain conservative at this time and try to avoid anesthesia as she's worried about the risks associated with such in setting of her lung disease (this is not unreasonable and worth continuing to try).  - we will continue on Dysphagia level 3 diet at discharge to [facility]; patient did not wish to attempt puree diet (although may still have to try this in the future if/when symptoms recur) vs short NPO period until able to swallow again. - if prolonged NPO, then patient will need to decide if she wishes to undergo EGD vs consideration of PEG placement for TF (which she can still eat with PEG in place, but at least PEG would provide adequate nutrition)  - I've discussed the above with her daughter and son in law who will continue discussions with patient at discharge as well   Acute on chronic respiratory failure with hypoxia ([place])- (present on admission) - on 2L nocturnal O2 at home - currently on RA at rest   Disorder of cervical esophageal region - Evaluated by SLP.  Concern for cervical esophageal dysphagia.  She had cervical web and prominent CP noted in 2013 with ongoing symptoms currently - Esophagram reveals ongoing achalasia.  Appreciate pulmonology evaluation; she is moderate to high risk for undergoing EGD (see discussion under achalasia as well). Future need for anesthesia will be a risk/benefit conversation and patient does show understanding of the risks/benefits. For  now, her goal is conservative measures   Physical deconditioning - evaluated by PT/OT - plan is for d/c to [facility] on 12/28; patient is aware   Chronic diastolic CHF (congestive heart failure) ([place])- (present on admission) - no s/s exacerbation  -Continue  Lasix  Severe sepsis (HCC)-resolved as of 06/21/2021, (present on admission) - tachycardia, neutropenia, hypoxia and pulm source - see covid   Hyponatremia-resolved as of 06/27/2021, (present on admission) - from poor oral intake on admission  Chronic anemia- (present on admission) - Baseline hemoglobin around 10 to 11 g/dL - Currently at baseline  Hypothyroid- (present on admission) - Continue Synthroid  A-fib ([place])- (present on admission) - Continue Eliquis     Principal Diagnosis: COVID-19 virus infection  Discharge Diagnoses: Principal Problem:   COVID-19 virus infection Active Problems:   Acute on chronic respiratory failure with hypoxia (HCC)   Cricopharyngeal achalasia   Disorder of cervical esophageal region   Physical deconditioning   Chronic diastolic CHF (congestive heart failure) (HCC)   A-fib (HCC)   Hypothyroid   Chronic anemia   Pressure injury of skin   Discharge Instructions     Increase activity slowly   Complete by: As directed    No wound care   Complete by: As directed       Allergies as of 06/28/2021       Reactions   Doxycycline Other (See Comments)   Rash on face and neck   Hydrocodone Other (See Comments)   Other reaction(s): hallucinations   Vicodin [hydrocodone-acetaminophen] Other (See Comments)   Hallucinations    Flagyl [metronidazole] Nausea And Vomiting   Moxifloxacin Other (See Comments)    Weakness/fatigue   Pantoprazole Sodium Rash   Penicillins Rash   Has patient had a PCN reaction causing immediate rash, facial/tongue/throat swelling, SOB or lightheadedness with hypotension:unsure Has patient had a PCN reaction causing severe rash involving mucus membranes or skin necrosis:unsure Has patient had a PCN reaction that required hospitalization:No Has patient had a PCN reaction occurring within the last 10 years:Yes Cannot recall exact reaction due to time lapse If all of the above answers are "NO", then may proceed  with Cephalosporin use. Other reaction(s): Unknown        Medication List     TAKE these medications    acetaminophen 325 MG tablet Commonly known as: TYLENOL Take 2 tablets (650 mg total) by mouth every 6 (six) hours as needed for mild pain (or Fever >/= 101).   apixaban 2.5 MG Tabs tablet Commonly known as: ELIQUIS Take 1 tablet (2.5 mg total) by mouth 2 (two) times daily.   FLUoxetine 20 MG capsule Commonly known as: PROZAC Take 20 mg by mouth daily.   furosemide 20 MG tablet Commonly known as: LASIX Take 20 mg by mouth daily.   hydrOXYzine 25 MG tablet Commonly known as: ATARAX Take 12.5-25 mg by mouth every 8 (eight) hours as needed for itching.   levothyroxine 50 MCG tablet Commonly known as: SYNTHROID Take 1 tablet (50 mcg total) by mouth daily.   omeprazole 40 MG capsule Commonly known as: PRILOSEC Take 1 capsule (40 mg total) by mouth 2 (two) times daily before a meal.   potassium chloride 8 MEQ tablet Commonly known as: KLOR-CON Take 16 mEq by mouth daily.   pravastatin 40 MG tablet Commonly known as: PRAVACHOL Take 1 tablet (40 mg total) by mouth at bedtime.   psyllium 58.6 % packet Commonly known as: METAMUCIL Take 1 packet by mouth daily as  needed (constipation).   Symbicort 80-4.5 MCG/ACT inhaler Generic drug: budesonide-formoterol INHALE 2 PUFFS INTO THE LUNGS TWICE DAILY What changed: when to take this        Contact information for after-discharge care     Destination     [facility] Preferred SNF .   Service: Skilled Nursing Contact information: [place] [address] [phone]                    Allergies  Allergen Reactions   Doxycycline Other (See Comments)    Rash on face and neck   Hydrocodone Other (See Comments)    Other reaction(s): hallucinations   Vicodin [Hydrocodone-Acetaminophen] Other (See Comments)    Hallucinations    Flagyl [Metronidazole]  Nausea And Vomiting   Moxifloxacin Other (See Comments)     Weakness/fatigue   Pantoprazole Sodium Rash   Penicillins Rash    Has patient had a PCN reaction causing immediate rash, facial/tongue/throat swelling, SOB or lightheadedness with hypotension:unsure Has patient had a PCN reaction causing severe rash involving mucus membranes or skin necrosis:unsure Has patient had a PCN reaction that required hospitalization:No Has patient had a PCN reaction occurring within the last 10 years:Yes Cannot recall exact reaction due to time lapse If all of the above answers are "NO", then may proceed with Cephalosporin use.  Other reaction(s): Unknown    Consultations: GI Pulm  Discharge Exam: BP 125/66 (BP Location: Right Arm)    Pulse 78    Temp (!) 97.4 F (36.3 C) (Oral)    Resp 20    Ht 5\' 7"  (1.702 m)    Wt 62.8 kg    LMP  (LMP Unknown)    SpO2 96%    BMI 21.69 kg/m  Physical Exam Constitutional:      Appearance: Normal appearance.  HENT:     Head: Normocephalic and atraumatic.     Mouth/Throat:     Mouth: Mucous membranes are moist.  Eyes:     Extraocular Movements: Extraocular movements intact.  Cardiovascular:     Pulses: Normal pulses.     Heart sounds: Normal heart sounds.  Pulmonary:     Comments: Coarse breath sounds bilaterally at baseline Abdominal:     General: Bowel sounds are normal. There is no distension.     Palpations: Abdomen is soft.     Tenderness: There is no abdominal tenderness.  Musculoskeletal:        General: Normal range of motion.     Cervical back: Normal range of motion and neck supple.  Skin:    General: Skin is warm and dry.  Neurological:     General: No focal deficit present.     Mental Status: She is alert and oriented to person, place, and time.  Psychiatric:        Mood and Affect: Mood normal.        Behavior: Behavior normal.     The results of significant diagnostics from this hospitalization (including imaging, microbiology,  ancillary and laboratory) are listed below for reference.   Microbiology: No results found for this or any previous visit (from the past 240 hour(s)).   Labs: BNP (last 3 results) Recent Labs    06/18/21 0040  BNP 355.7*   Basic Metabolic Panel: Recent Labs  Lab 06/23/21 0410 06/24/21 0337 06/25/21 0334 06/26/21 0332 06/27/21 0332  NA 132* 132* 131* 133* 136  K 3.2* 3.1* 4.2 4.4 4.9  CL 96* 99 97*  101 99  CO2 27 27 26 29 31   GLUCOSE 82 93 129* 121* 122*  BUN 13 12 15 17  28*  CREATININE 0.43* 0.42* 0.57 0.59 0.61  CALCIUM 8.4* 8.1* 8.8* 8.7* 9.2  MG 1.8 2.0 1.9 2.0 2.4   Liver Function Tests: Recent Labs  Lab 06/22/21 0422 06/23/21 0410 06/24/21 0337 06/25/21 0334  AST 28 18 17 19   ALT 19 15 13 14   ALKPHOS 109 89 89 102  BILITOT 0.8 0.7 1.1 1.0  PROT 7.0 5.7* 5.9* 6.4*  ALBUMIN 3.1* 2.5* 2.7* 2.9*   No results for input(s): LIPASE, AMYLASE in the last 168 hours. No results for input(s): AMMONIA in the last 168 hours. CBC: Recent Labs  Lab 06/23/21 0410 06/24/21 0337 06/25/21 0334 06/26/21 0332 06/27/21 0332  WBC 8.2 7.1 8.6 9.3 11.8*  NEUTROABS 5.6 4.6 7.0 7.6 9.5*  HGB 11.5* 11.6* 11.9* 11.0* 11.4*  HCT 36.6 37.2 38.3 35.8* 37.4  MCV 80.6 81.4 82.5 83.1 84.0  PLT 284 306 362 368 375   Cardiac Enzymes: No results for input(s): CKTOTAL, CKMB, CKMBINDEX, TROPONINI in the last 168 hours. BNP: Invalid input(s): POCBNP CBG: No results for input(s): GLUCAP in the last 168 hours. D-Dimer No results for input(s): DDIMER in the last 72 hours. Hgb A1c No results for input(s): HGBA1C in the last 72 hours. Lipid Profile No results for input(s): CHOL, HDL, LDLCALC, TRIG, CHOLHDL, LDLDIRECT in the last 72 hours. Thyroid function studies No results for input(s): TSH, T4TOTAL, T3FREE, THYROIDAB in the last 72 hours.  Invalid input(s): FREET3 Anemia work up No results for input(s): VITAMINB12, FOLATE, FERRITIN, TIBC, IRON, RETICCTPCT in the last 72  hours. Urinalysis    Component Value Date/Time   COLORURINE YELLOW 06/17/2021 [place] 06/17/2021 1655   LABSPEC 1.010 06/17/2021 1655   PHURINE 7.0 06/17/2021 1655   GLUCOSEU NEGATIVE 06/17/2021 1655   HGBUR NEGATIVE 06/17/2021 [place] 06/17/2021 1655   BILIRUBINUR Neg 02/06/2012 1022   KETONESUR NEGATIVE 06/17/2021 1655   PROTEINUR 30 (A) 06/17/2021 1655   UROBILINOGEN 0.2 04/07/2012 1443   NITRITE NEGATIVE 06/17/2021 1655   LEUKOCYTESUR TRACE (A) 06/17/2021 1655   Sepsis Labs Invalid input(s): PROCALCITONIN,  WBC,  LACTICIDVEN Microbiology No results found for this or any previous visit (from the past 240 hour(s)).  Procedures/Studies: DG Chest Port 1 View  Result Date: 06/17/2021 CLINICAL DATA:  Positive COVID test.  Productive cough for 3 days. EXAM: PORTABLE CHEST 1 VIEW COMPARISON:  02/19/2020 and older studies. FINDINGS: Cardiac silhouette mildly enlarged.  No mediastinal or hilar masses. Lungs hyperexpanded. Coarse heterogeneous interstitial thickening with intervening ground-glass opacities, most evident in the upper lobes, similar to the prior exam. Subtle opacity at the right lung base has increased from the prior exam, which could reflect progressive fibrosis or acute infiltrate. No evidence of pulmonary edema. No convincing pleural effusion and no pneumothorax. Old proximal left humeral fracture, healed. IMPRESSION: 1. Mild reticular and subtle hazy airspace opacity at the right lung base new since the prior exam. This may reflect pneumonia or atelectasis or progression of pulmonary fibrosis. 2. Other changes of upper lobe predominant pulmonary fibrosis are stable. No other evidence of acute cardiopulmonary disease. Electronically Signed   By: Lajean Manes M.D.   On: 06/17/2021 16:11   DG ESOPHAGUS W SINGLE CM (SOL OR THIN BA)  Result Date: 06/23/2021 CLINICAL DATA:  Dysphagia, COVID pneumonia EXAM: ESOPHOGRAM/BARIUM SWALLOW  TECHNIQUE: Single contrast examination was performed using  thin barium. FLUOROSCOPY TIME:  Fluoroscopy Time:  54 seconds Number of Acquired Spot Images: 6 COMPARISON:  None. FINDINGS: Moderate esophageal dysmotility. Contrast transiently/intermittently passes via the GE junction into the stomach, confirming patency (series 3/image 129), although narrowing/stenosis is technically possible in this region. Majority of contrast remained backed up within the esophagus. IMPRESSION: Moderate esophageal dysmotility, with only transient/intermittent passage of contrast via the GE junction, confirming patency but suggesting mild achalasia. Narrowing/stenosis at the GE junction is possible. Electronically Signed   By: Julian Hy M.D.   On: 06/23/2021 16:07     Time coordinating discharge: Over 30 minutes    Dwyane Dee, MD  [facility] 06/28/2021, 10:16 AM

## 2021-06-28 NOTE — Plan of Care (Signed)
°  Problem: Clinical Measurements: Goal: Respiratory complications will improve Outcome: Adequate for Discharge   Problem: Clinical Measurements: Goal: Cardiovascular complication will be avoided Outcome: Adequate for Discharge   Problem: Nutrition: Goal: Adequate nutrition will be maintained Outcome: Adequate for Discharge   Problem: Elimination: Goal: Will not experience complications related to bowel motility Outcome: Adequate for Discharge   Problem: Pain Managment: Goal: General experience of comfort will improve Outcome: Adequate for Discharge

## 2021-06-28 NOTE — Progress Notes (Signed)
Updated pt's daughter [name] that transport was arranged for pt to go to [facility] today. Called [facility] and gave report to RN.

## 2021-06-29 DIAGNOSIS — R1314 Dysphagia, pharyngoesophageal phase: Secondary | ICD-10-CM | POA: Diagnosis not present

## 2021-06-29 DIAGNOSIS — K5900 Constipation, unspecified: Secondary | ICD-10-CM | POA: Diagnosis not present

## 2021-06-29 DIAGNOSIS — K219 Gastro-esophageal reflux disease without esophagitis: Secondary | ICD-10-CM | POA: Diagnosis not present

## 2021-06-29 DIAGNOSIS — J449 Chronic obstructive pulmonary disease, unspecified: Secondary | ICD-10-CM | POA: Diagnosis not present

## 2021-06-29 DIAGNOSIS — U071 COVID-19: Secondary | ICD-10-CM | POA: Diagnosis not present

## 2021-06-29 DIAGNOSIS — I509 Heart failure, unspecified: Secondary | ICD-10-CM | POA: Diagnosis not present

## 2021-06-29 DIAGNOSIS — R54 Age-related physical debility: Secondary | ICD-10-CM | POA: Diagnosis not present

## 2021-06-29 DIAGNOSIS — I4891 Unspecified atrial fibrillation: Secondary | ICD-10-CM | POA: Diagnosis not present

## 2021-06-29 DIAGNOSIS — I110 Hypertensive heart disease with heart failure: Secondary | ICD-10-CM | POA: Diagnosis not present

## 2021-06-29 DIAGNOSIS — F32A Depression, unspecified: Secondary | ICD-10-CM | POA: Diagnosis not present

## 2021-06-30 ENCOUNTER — Telehealth: Payer: Self-pay

## 2021-06-30 DIAGNOSIS — I4891 Unspecified atrial fibrillation: Secondary | ICD-10-CM | POA: Diagnosis not present

## 2021-06-30 DIAGNOSIS — I509 Heart failure, unspecified: Secondary | ICD-10-CM | POA: Diagnosis not present

## 2021-06-30 DIAGNOSIS — J9621 Acute and chronic respiratory failure with hypoxia: Secondary | ICD-10-CM | POA: Diagnosis not present

## 2021-06-30 DIAGNOSIS — D6869 Other thrombophilia: Secondary | ICD-10-CM | POA: Diagnosis not present

## 2021-06-30 NOTE — Telephone Encounter (Signed)
-----   Message from Irving Copas., MD sent at 06/25/2021 10:39 PM EST ----- Regarding: Follow-up dysphagia and abnormal barium swallow JMP, Patient evaluated during hospitalization with recent COVID and bacterial superinfection with significant progression of dysphagia with barium swallow showing potential distal stricture as well as dysmotility.  Her advanced ILD makes it hard for me to pursue an endoscopy and she does not want to risk at this time unless things progress.  If she is able to be discharged it would be ideal for her to follow-up with you.  If she has progressive issues during her hospitalization she will remain an inpatient and inpatient team will decide about a possible endoscopy. Linda, Please schedule this patient to follow-up with one of the APP's for with Dr. Hilarie Fredrickson in 6 weeks.  Thanks. GM

## 2021-06-30 NOTE — Telephone Encounter (Signed)
Spoke with daughter to let her know appt day and time. Daughter verbalized understanding.

## 2021-06-30 NOTE — Telephone Encounter (Signed)
App February schedule not yet available.  Pt is scheduled for follow up with Dr. Hilarie Fredrickson on 08/09/21 at 10:30 am. Left voicemail for pt to return call. Also called daughter and left message for her to return call.

## 2021-07-04 DIAGNOSIS — Z20822 Contact with and (suspected) exposure to covid-19: Secondary | ICD-10-CM | POA: Diagnosis not present

## 2021-07-06 ENCOUNTER — Other Ambulatory Visit: Payer: Self-pay | Admitting: *Deleted

## 2021-07-06 NOTE — Patient Outreach (Signed)
Per [place] eligible member resides in  [facility].  Screened for potential [facility] Care Management services.   Update received from [place], [place] indicating member recently admitted to SNF. Transition plan not determined yet. However, Mrs. Stocking may transition to ALF vs LTC post SNF.   Will continue to follow while in SNF.    Stephanie Rolling, MSN, RN,BSN [place] Acute Care Coordinator [phone] [facility]) [phone]  (Toll free office)

## 2021-07-10 ENCOUNTER — Other Ambulatory Visit: Payer: Self-pay | Admitting: *Deleted

## 2021-07-10 NOTE — Patient Outreach (Signed)
Stephanie Cortez resides in [facility].   Facility site visit to [place].  Collaboration with Bryson Ha, SW concerning member's progress, transition plan, and potential [facility] needs. Bryson Ha reports Stephanie Cortez requires [employer]. She is from home alone. Member's daughter Stephanie Cortez is primary contact. Transition plan likely ALF vs LTC.  However, not certain at this time.   Met with Stephanie Cortez for brief period as she was going to the facility Human resources officer. Explained [facility] Care Management services should she return home upon SNF dc. Provided [facility] Care Management brochure and writer's business card.   Will continue to follow and collaborate with SNF SW while Stephanie Cortez resides in SNF.  Will plan outreach to daughter if transition plan is for home.    Marthenia Rolling, MSN, RN,BSN [place] Acute Care Coordinator [phone] [facility]) [phone]  (Toll free office)

## 2021-07-13 ENCOUNTER — Ambulatory Visit: Payer: Medicare Other | Admitting: Internal Medicine

## 2021-07-18 ENCOUNTER — Other Ambulatory Visit: Payer: Self-pay | Admitting: *Deleted

## 2021-07-18 ENCOUNTER — Telehealth: Payer: Self-pay

## 2021-07-18 DIAGNOSIS — J849 Interstitial pulmonary disease, unspecified: Secondary | ICD-10-CM

## 2021-07-18 NOTE — Telephone Encounter (Signed)
Spoke with patient's daughter [name] and scheduled an in-person Palliative Consult for 07/25/21 @ 2:30PM.  COVID screening was negative. No pets in home. Patient lives alone. Will be discharged from [facility] on 1/19.  Consent obtained; updated Outlook/Netsmart/Team List and Epic.   Family is aware they may be receiving a call from provider the day before or day of to confirm appointment.

## 2021-07-18 NOTE — Patient Outreach (Signed)
[facility] Post- Acute Care Coordinator follow up. Member screened for potential [facility] services as a benefit of Mrs. [employer] plan. Stephanie Cortez resides in [facility].   Telephone call made to Stephanie Cortez (daughter/DPR) [phone] to discuss [place] Management services. Patient identifiers confirmed. Stephanie Cortez reports Stephanie Cortez will transition to home from [place] [facility] on Thursday, January 19th. Stephanie Cortez lives alone and will have home health. Stephanie Cortez states she remembers [place] Management in the past. Agreeable to [place] Management follow up again.   Stephanie Cortez also reports Stephanie Cortez will need assistance with mobile meals. Requests that writer make referral for mobile meals assistance. [name] also endorses that she would like another referral to [facility] palliative for Stephanie Cortez when she returns home. Discussed that writer will make the appropriate referrals to [place] for complex case management, [facility] care guide for mobile meals assistance, and [facility] outpatient palliative.  Stephanie Cortez (daughter/DPR) states she should be primary contact at [phone].  Will make referrals for [place] for care coordination. Will make referral for [place] for mobile meals. Notification sent to [facility] outpatient palliative to request referral per Stephanie Cortez (daughter) request.   Will collaborate with Bryson Ha, SNF SW at [facility] to make aware of above information and confirm transition date.    Stephanie Cortez to transition from [place] [facility] to home on 07/20/21 per Stephanie Cortez (daughter)   Stephanie Rolling, MSN, RN,BSN [place] Acute Care Coordinator [phone] [facility]) [phone]  (Toll free office)

## 2021-07-19 ENCOUNTER — Telehealth: Payer: Self-pay

## 2021-07-19 NOTE — Telephone Encounter (Signed)
° °  Telephone encounter was:  Successful.  07/19/2021 Name: Stephanie Cortez MRN: [id number] DOB: 09/09/1933  Stephanie Cortez is a 88 y.o. year old female who is a primary care patient of [name], MD . The community resource team was consulted for assistance with [place] guide performed the following interventions: Patient provided with information about care guide support team and interviewed to confirm resource needs.Im covering meals on wheels refferal for Stephanie Cortez, another care guide Stephanie Cortez has the referral for the Husband is taking care of the rest of the husbands needs in [place]  Follow Up Plan:     [place], Care Management  [phone] [address], [place], [place] [address] Phone: [phone] Email: [email or web address]

## 2021-07-19 NOTE — Patient Outreach (Signed)
[facility]) Care Management  07/19/2021  Stephanie Cortez 12-Dec-1933 [id number]   Hospital referral received from [facility], RN for [place] (central) for care coordination. Assigned to Valente David, RN Care Coordinator for follow up.  Thank you, [place] Care Management Assistant

## 2021-07-20 ENCOUNTER — Telehealth: Payer: Self-pay

## 2021-07-20 NOTE — Telephone Encounter (Signed)
° °  Telephone encounter was:  Unsuccessful.  07/20/2021 Name: Stephanie Cortez MRN: [id number] DOB: [birth year]  Unsuccessful outbound call made today to assist with:   mow and transportation  Outreach Attempt:  2nd Attempt  A HIPAA compliant voice message was left requesting a return call.  Instructed patient to call back at [phone] at earliest convenience.    [place], Care Management  [phone] [address], [place], [place] [address] Phone: [phone] Email: [email or web address]

## 2021-07-21 ENCOUNTER — Other Ambulatory Visit: Payer: Self-pay | Admitting: *Deleted

## 2021-07-21 ENCOUNTER — Telehealth: Payer: Self-pay

## 2021-07-21 DIAGNOSIS — J9621 Acute and chronic respiratory failure with hypoxia: Secondary | ICD-10-CM | POA: Diagnosis not present

## 2021-07-21 DIAGNOSIS — Z7901 Long term (current) use of anticoagulants: Secondary | ICD-10-CM | POA: Diagnosis not present

## 2021-07-21 DIAGNOSIS — E785 Hyperlipidemia, unspecified: Secondary | ICD-10-CM | POA: Diagnosis not present

## 2021-07-21 DIAGNOSIS — K589 Irritable bowel syndrome without diarrhea: Secondary | ICD-10-CM | POA: Diagnosis not present

## 2021-07-21 DIAGNOSIS — K219 Gastro-esophageal reflux disease without esophagitis: Secondary | ICD-10-CM | POA: Diagnosis not present

## 2021-07-21 DIAGNOSIS — Z8701 Personal history of pneumonia (recurrent): Secondary | ICD-10-CM | POA: Diagnosis not present

## 2021-07-21 DIAGNOSIS — I110 Hypertensive heart disease with heart failure: Secondary | ICD-10-CM | POA: Diagnosis not present

## 2021-07-21 DIAGNOSIS — K5790 Diverticulosis of intestine, part unspecified, without perforation or abscess without bleeding: Secondary | ICD-10-CM | POA: Diagnosis not present

## 2021-07-21 DIAGNOSIS — K220 Achalasia of cardia: Secondary | ICD-10-CM | POA: Diagnosis not present

## 2021-07-21 DIAGNOSIS — J449 Chronic obstructive pulmonary disease, unspecified: Secondary | ICD-10-CM | POA: Diagnosis not present

## 2021-07-21 DIAGNOSIS — I5032 Chronic diastolic (congestive) heart failure: Secondary | ICD-10-CM | POA: Diagnosis not present

## 2021-07-21 DIAGNOSIS — F329 Major depressive disorder, single episode, unspecified: Secondary | ICD-10-CM | POA: Diagnosis not present

## 2021-07-21 DIAGNOSIS — Z9181 History of falling: Secondary | ICD-10-CM | POA: Diagnosis not present

## 2021-07-21 DIAGNOSIS — D649 Anemia, unspecified: Secondary | ICD-10-CM | POA: Diagnosis not present

## 2021-07-21 DIAGNOSIS — R1314 Dysphagia, pharyngoesophageal phase: Secondary | ICD-10-CM | POA: Diagnosis not present

## 2021-07-21 DIAGNOSIS — J9611 Chronic respiratory failure with hypoxia: Secondary | ICD-10-CM | POA: Diagnosis not present

## 2021-07-21 DIAGNOSIS — E039 Hypothyroidism, unspecified: Secondary | ICD-10-CM | POA: Diagnosis not present

## 2021-07-21 DIAGNOSIS — Z7951 Long term (current) use of inhaled steroids: Secondary | ICD-10-CM | POA: Diagnosis not present

## 2021-07-21 DIAGNOSIS — U071 COVID-19: Secondary | ICD-10-CM | POA: Diagnosis not present

## 2021-07-21 DIAGNOSIS — I482 Chronic atrial fibrillation, unspecified: Secondary | ICD-10-CM | POA: Diagnosis not present

## 2021-07-21 NOTE — Telephone Encounter (Signed)
° °  Telephone encounter was:  Successful.  07/21/2021 Name: [name] MRN: [id number] DOB: Mar 29, 1934  Stephanie Cortez is a 88 y.o. year old female who is a primary care patient of [name], MD . The community resource team was consulted for assistance with  follow up call  Care guide performed the following interventions: Follow up call placed to the patient to discuss status of referral.  Follow Up Plan:  No further follow up planned at this time. The patient has been provided with needed resources.    [place], Care Management  [phone] [address], [place], [place] [address] Phone: [phone] Email: [email or web address]

## 2021-07-21 NOTE — Patient Outreach (Signed)
[facility]) Care Management  07/21/2021  JADASIA HAWS [birth year] [id number]   Referral Date: 1/18 Referral Source: Post Acute Care Coordinator Date of Admission: 12/28 from [facility] Diagnosis: Covid Date of Discharge: 1/19 Facility: [facility] Insurance: [employer] attempt #1, successful to daughter. State they are in the process of getting the initial home assessment completed by [facility], request call back early next week.  Plan: RN CM will send successful outreach letter with [facility] brochure and 24 hour triage information.  Will follow up with member/daughter next week.  Valente David, RN, MSN, [place] Manager [phone]

## 2021-07-24 ENCOUNTER — Telehealth: Payer: Self-pay | Admitting: Internal Medicine

## 2021-07-24 NOTE — Telephone Encounter (Signed)
Spoke with pt and advised to contact PCP hotline number at [phone] to assist with finding a new female PCP.  Pt verbalizes understanding and agrees with current plan.

## 2021-07-24 NOTE — Telephone Encounter (Signed)
° °  Pt would like to ask Dr. Caryl Comes for referral for a female pcp, she said she is not satisfied with her pcp right now.

## 2021-07-25 ENCOUNTER — Other Ambulatory Visit: Payer: Medicare Other | Admitting: Family Medicine

## 2021-07-25 ENCOUNTER — Other Ambulatory Visit: Payer: Self-pay

## 2021-07-26 ENCOUNTER — Other Ambulatory Visit: Payer: Self-pay | Admitting: *Deleted

## 2021-07-26 DIAGNOSIS — I482 Chronic atrial fibrillation, unspecified: Secondary | ICD-10-CM | POA: Diagnosis not present

## 2021-07-26 DIAGNOSIS — J449 Chronic obstructive pulmonary disease, unspecified: Secondary | ICD-10-CM | POA: Diagnosis not present

## 2021-07-26 DIAGNOSIS — U071 COVID-19: Secondary | ICD-10-CM | POA: Diagnosis not present

## 2021-07-26 DIAGNOSIS — I5032 Chronic diastolic (congestive) heart failure: Secondary | ICD-10-CM | POA: Diagnosis not present

## 2021-07-26 DIAGNOSIS — J9611 Chronic respiratory failure with hypoxia: Secondary | ICD-10-CM | POA: Diagnosis not present

## 2021-07-26 DIAGNOSIS — I110 Hypertensive heart disease with heart failure: Secondary | ICD-10-CM | POA: Diagnosis not present

## 2021-07-26 NOTE — Patient Instructions (Signed)
Visit Information  Thank you for taking time to visit with me today. Please don't hesitate to contact me if I can be of assistance to you before our next scheduled telephone appointment.  Following are the goals we discussed today:  Confirm follow up appointments with cardiology and pulmonology Monitor HR, BP, and weights daily, write them down   Our next appointment is by telephone on next week   The patient verbalized understanding of instructions, educational materials, and care plan provided today and agreed to receive a mailed copy of patient instructions, educational materials, and care plan.   The patient has been provided with contact information for the care management team and has been advised to call with any health related questions or concerns.   Valente David, RN, MSN, [place] Manager [phone]

## 2021-07-26 NOTE — Patient Outreach (Signed)
[facility]) Care Management  [place]  07/26/2021   AMARACHUKWU [name] 28-Feb-1934 [id number]   Referral Date: 1/18 Referral Source: Post Acute Care Coordinator Date of Admission: 12/28 from [facility] Diagnosis: Covid Date of Discharge: 1/19 Facility: [facility] Insurance: DCE  Outreach attempt #2, successful to daughter. Identity verified.  This care manager introduced self and stated purpose of call.  [facility] care management services explained.     Social: Member lives alone but daughter is in the home with her daily.  She helps with bathing, dressing, and cooking.  Report concern about member using briefs and wetting during the night.  She has told member not to get up to go into bathroom alone and to wait until she is back with her in the mornings.  Discussed interventions such as having the member use a BSC during the night, only having to stand and pivot.  She will work with member and request PT (home health for PT, OT, and nursing involved) to help with teaching how to use.    Conditions:Per chart, member has history of CHF, A-fib, HTN, GERD, Hypothyroid, COPD, and falls.  She does not monitor blood pressure and HR daily but does keep track of weights, yesterday was 140 pounds.  Encouraged to monitor BP and HR daily as well.    Medications: Reviewed with daughter, state member is taking as instructed.  Denies questions or need for financial assistance.  Report Lasix has been decreased to 1/2 tablet, hoping this will help with wetting during the night.   Appointments: Has upcoming appointment with PCP on 2/1 and with GI on 2/8.   [facility] was scheduled to visit yesterday, daughter state they were told someone came but no one answered the door.  Home visit has been rescheduled for tomorrow.  Consent: Member's daughter agree to [facility] involvement.  Encounter Medications:  Outpatient Encounter Medications as of 07/26/2021  Medication Sig   acetaminophen (TYLENOL) 325 MG  tablet Take 2 tablets (650 mg total) by mouth every 6 (six) hours as needed for mild pain (or Fever >/= 101).   apixaban (ELIQUIS) 2.5 MG TABS tablet Take 1 tablet (2.5 mg total) by mouth 2 (two) times daily.   FLUoxetine (PROZAC) 20 MG capsule Take 20 mg by mouth daily.   furosemide (LASIX) 20 MG tablet Take 20 mg by mouth daily.   levothyroxine (SYNTHROID) 50 MCG tablet Take 1 tablet (50 mcg total) by mouth daily.   omeprazole (PRILOSEC) 40 MG capsule Take 1 capsule (40 mg total) by mouth 2 (two) times daily before a meal.   potassium chloride (KLOR-CON) 8 MEQ tablet Take 16 mEq by mouth daily.   pravastatin (PRAVACHOL) 40 MG tablet Take 1 tablet (40 mg total) by mouth at bedtime.   psyllium (METAMUCIL) 58.6 % packet Take 1 packet by mouth daily as needed (constipation).   SYMBICORT 80-4.5 MCG/ACT inhaler INHALE 2 PUFFS INTO THE LUNGS TWICE DAILY (Patient taking differently: Inhale 2 puffs into the lungs in the morning and at bedtime.)   hydrOXYzine (ATARAX/VISTARIL) 25 MG tablet Take 12.5-25 mg by mouth every 8 (eight) hours as needed for itching. (Patient not taking: Reported on 07/26/2021)   No facility-administered encounter medications on file as of 07/26/2021.    Functional Status:  In your present state of health, do you have any difficulty performing the following activities: 06/20/2021  Hearing? N  Vision? N  Difficulty concentrating or making decisions? N  Walking or climbing stairs? Y  Dressing or  bathing? N  Doing errands, shopping? Y  Some recent data might be hidden    Fall/Depression Screening: Fall Risk  07/26/2021 08/01/2020 06/20/2020  Falls in the past year? 0 0 0  Number falls in past yr: 0 0 -  Injury with Fall? 0 0 -  Risk for fall due to : - - -  Risk for fall due to: Comment - - -   PHQ 2/9 Scores 07/26/2021 08/01/2020 03/15/2020 01/31/2015  PHQ - 2 Score - 0 0 0  PHQ- 9 Score - - 0 -  Exception Documentation (No Data) - - -    Assessment:   Care Plan Care  Plan : [place] (Adult)  Updates made by Valente David, RN since 07/26/2021 12:00 AM     Problem: Care Coordination and education needs related to chronic and acute health conditions (A-fib, CHF, and Covid)   Priority: High     Long-Range Goal: Member and family will adequately manage chronic health conditions (A-fib & CHF) and recovery of acute condition (Covid)   Start Date: 07/26/2021  Expected End Date: 01/22/2022  Priority: High  Note:   Current Barriers:  Chronic Disease Management support and education needs related to Atrial Fibrillation, CHF, and Covid   RNCM Clinical Goal(s):  Patient will verbalize understanding of plan for management of Atrial Fibrillation, CHF, and Covid as evidenced by member/family verbalizing adequate plan of care take all medications exactly as prescribed and will call provider for medication related questions as evidenced by member/family reporting adherence attend all scheduled medical appointments: GI and PCP as evidenced by completed appointments in Epic and family reported attendance work with home health RN, PT, and OT  to recover and increase independence/strength as evidenced by member's ability to bathe/dress self  through collaboration with Consulting civil engineer, provider, and care team.   Interventions: Inter-disciplinary care team collaboration (see longitudinal plan of care) Evaluation of current treatment plan related to  self management and patient's adherence to plan as established by provider   AFIB Interventions: (Status:  New goal.) Long Term Goal   Counseled on increased risk of stroke due to Afib and benefits of anticoagulation for stroke prevention Reviewed importance of adherence to anticoagulant exactly as prescribed Afib action plan reviewed   Heart Failure Interventions:  (Status:  New goal.) Long Term Goal Provided education on low sodium diet Reviewed Heart Failure Action Plan in depth and provided written copy Advised  patient to weigh each morning after emptying bladder  COVID Interventions:  (Status:  New goal.) Long Term Goal Provided education to patient to enhance basic understanding of COVID-19 as a viral disease, measures to prevent exposure, signs and symptoms, recommended vaccine schedule, when to contact provider Discussed effects, symptoms, and management of "long COVID"'  Patient Goals/Self-Care Activities: Take all medications as prescribed Attend all scheduled provider appointments Perform all self care activities independently  track weight in diary use salt in moderation watch for swelling in feet, ankles and legs every day weigh myself daily check pulse (heart) rate once a day make a plan to eat healthy take medicine as prescribed  Follow Up Plan:  Telephone follow up appointment with care management team member scheduled for:  1/31 The patient has been provided with contact information for the care management team and has been advised to call with any health related questions or concerns.          Plan:  Follow-up: Patient agrees to Care Plan and Follow-up. Follow-up in  1 week(s).  Will send education for HF zones and Coid recovery.  Will notify PCP of [facility] engagement.  Valente David, RN, MSN, [place] Manager [phone]

## 2021-07-27 ENCOUNTER — Other Ambulatory Visit: Payer: Self-pay

## 2021-07-27 ENCOUNTER — Other Ambulatory Visit: Payer: Medicare Other | Admitting: Family Medicine

## 2021-07-27 ENCOUNTER — Encounter: Payer: Self-pay | Admitting: Family Medicine

## 2021-07-27 VITALS — BP 118/76 | HR 84 | Resp 18 | Wt 139.0 lb

## 2021-07-27 DIAGNOSIS — K5900 Constipation, unspecified: Secondary | ICD-10-CM | POA: Diagnosis not present

## 2021-07-27 DIAGNOSIS — E46 Unspecified protein-calorie malnutrition: Secondary | ICD-10-CM

## 2021-07-27 DIAGNOSIS — I5032 Chronic diastolic (congestive) heart failure: Secondary | ICD-10-CM | POA: Diagnosis not present

## 2021-07-27 DIAGNOSIS — J849 Interstitial pulmonary disease, unspecified: Secondary | ICD-10-CM | POA: Diagnosis not present

## 2021-07-27 DIAGNOSIS — E8809 Other disorders of plasma-protein metabolism, not elsewhere classified: Secondary | ICD-10-CM | POA: Diagnosis not present

## 2021-07-27 DIAGNOSIS — Z515 Encounter for palliative care: Secondary | ICD-10-CM | POA: Insufficient documentation

## 2021-07-27 DIAGNOSIS — R5381 Other malaise: Secondary | ICD-10-CM

## 2021-07-27 DIAGNOSIS — K220 Achalasia of cardia: Secondary | ICD-10-CM

## 2021-07-27 NOTE — Progress Notes (Signed)
[facility] Palliative Care Consult Note Telephone: [phone]  Fax: [phone]   Date of encounter: 07/27/21 11:15 AM  PATIENT NAME: Stephanie Cortez [address] [place] [address]   [phone] (home) [phone] (work) DOB: [birth year] MRN: [id number] PRIMARY CARE PROVIDER:    [name], MD,  [place] [address] Welcome:   [name], [place],  [place] [address] [phone]  RESPONSIBLE PARTY:    Contact Information     Name Relation Home Work Mobile   [place] Daughter [phone]  [phone]   [name] [phone]     Alexi, Dorminey [phone]          I met face to face with patient and her daughter [name] in pt's home. Palliative Care was asked to follow this patient by consultation request of  [name], MD to address advance care planning and complex medical decision making. This is the initial visit.          ASSESSMENT, SYMPTOM MANAGEMENT AND PLAN / RECOMMENDATIONS:   Palliative Care Encounter-Discussion of advance directive/living will/Five   Wishes, Durable and HC POA, DNR and MOST form.  Pt is DNR.  SW referral in 7-10 days to complete MOST per pt wishes.   Interstitial Lung Disease-Follows with Pulmonologist Dr Chase Caller.          Has qualified for nocturnal O2 supplied by [employer].  Continue current         treatment plan. At risk for aspiration pneumonia.   Chronic Diastolic CHF-Recommend daily weights, not on any meds for         Heart failure except Lasix 10 mg daily. Recommend low sodium diet.   Protein Calorie Malnutrition with cricopharyngeal achalasia-Albumin low at 2.9 on 06/25/21. Pt has had successful Botulinin toxin before to treat and may be a good candidate to repeat to improve swallow.  Encourage protein supplementation.  Has [place] [facility] [place] bringing ST to home.  Recommend moistened foods, fine consistency no dry or chewy foods.  Alternate bites/sips.   Physical deconditioning-PT/OT from [facility].  Encouraged to start nighttime voiding with bedside commode at bedside to avoid incontinence and ensure safety.  Use good lighting and remove any trip hazards. Encouraged daughter to ask/not tell her mother if she needs help and to avoid doing things she can do for herself.   Constipation-Recommend Miralax 17 gm daily in 8 ounces fluid of choice  daily with goal of soft formed stool every 2-3 days.   Follow up Palliative Care Visit: Palliative care will continue to follow for complex medical decision making, advance care planning, and clarification of goals. Return 4 weeks or prn. 7-10 days for SW referral    This visit was coded based on medical decision making (MDM).  PPS: 70%  HOSPICE ELIGIBILITY/DIAGNOSIS: TBD  Chief Complaint:  [place] received a referral to follow up with patient for chronic disease management, advance directive and defining/refining goals of care.  HISTORY OF PRESENT ILLNESS:  Stephanie Cortez is a 36 y.o. year old female living alone with help from her daughter and [facility] POA, [name].  She has interstitial lung disease, chronic diastolic heart failure, some deconditioning from recent Covid 19 pneumonia with sepsis treated with Remdesivir and some issues with swallowing.  Pt has recently been using incontinence briefs at night since it is very important to  her to be in her own home and as independent as possible but daughter is very concerned for fall risk.  Pt has been using trazodone at night for sleep.  She states she gets drowsy but no lightheadedness after taking meds.  Daughter at times answers for pt, has significant concerns for mother's safety to prevent falls and maintain her independence.  Her initial response is to arrange things and do for her mother. Pt is  independent with ADLs but using briefs at night to avoid getting up to the bathroom. Pt has had some hard stools and need to strain having a BM. She does have a housekeeper that comes in twice a month to clean but yesterday pt did clean her own kitchen.  Daughter indicated that pt has had recent trouble with swallowing but this seems to have improved since being home from [place].  She has ST coming as part of her home health.  Per record review she has a hx of longstanding cricopharyngeal achalasia and has had successful treatment previously with Botulinin toxin. Pt states her goal for the next year is to resume driving herself.  History obtained from review of EMR, interview with daughter [name] and/or Ms. Yokley.  I reviewed available labs, medications, imaging, studies and related documents from the EMR.  Records reviewed and summarized above.   ROS General: NAD EYES: denies vision changes ENMT: endorses some dysphagia Cardiovascular: denies chest pain, endorses DOE Pulmonary: denies cough at present with intake, denies increased SOB Abdomen: endorses good appetite, endorses constipation, endorses continence of bowel GU: denies dysuria, endorses continence of urine (use of briefs at night for convenience and safety, not true incontinence) MSK:  endorses increased generalized weakness, no falls reported Skin: denies rashes or wounds Neurological: denies pain, endorses insomnia but Trazodone effective Psych: Endorses positive mood Heme/lymph/immuno: denies bruises, abnormal bleeding  Physical Exam: Current and past weights: 139 lbs (63 kg) today, weight at hospital d/c 06/18/21 was 62.8 kg Constitutional: NAD General:  thin, WN, WD EYES: anicteric sclera, lids intact, no discharge  ENMT: intact hearing, oral mucous membranes moist, dentition intact CV: S1S2, IRIR, no LE edema Pulmonary: CTAB, no increased work of breathing, no cough, room air Abdomen: normo-active BS + 4 quadrants, soft and  non tender, no ascites GU: deferred MSK: no sarcopenia, moves all extremities, ambulatory Skin: warm and dry, no rashes or wounds on visible skin Neuro:  no generalized weakness, no cognitive impairment Psych: non-anxious affect, A and O x 3 Hem/lymph/immuno: no widespread bruising  CURRENT PROBLEM LIST:  Patient Active Problem List   Diagnosis Date Noted   Palliative care encounter 07/27/2021   Cricopharyngeal achalasia 06/24/2021   Disorder of cervical esophageal region 06/23/2021   Pressure injury of skin 06/21/2021   Physical deconditioning 06/21/2021   SOB (shortness of breath) 06/18/2021   Acute on chronic respiratory failure with hypoxia (HCC) 06/18/2021   Chronic diastolic CHF (congestive heart failure) ([place]) 06/18/2021   Chronic anemia 06/18/2021   COVID-19 virus infection 06/17/2021   Chronic pain of right knee 06/20/2020   Abnormality of gait 06/20/2020   Malnutrition of moderate degree 02/25/2020   S/P percutaneous endoscopic gastrostomy (PEG) tube placement (HCC)    Post-operative pain    Subtherapeutic international normalized ratio (INR)    Debility 02/17/2020   Mesenteric ischemia (HCC)    Sepsis ([place]) 02/08/2020   Small bowel ischemia ([place]) 02/08/2020   Thrombocytosis 02/08/2020   Abnormal CT of the abdomen    Periprosthetic fracture around internal  prosthetic hip joint 11/16/2019   Periprosthetic fracture around internal prosthetic hip joint, initial encounter    Chronic obstructive pulmonary disease ([place])    Essential hypertension    Chronic anticoagulation    Acute blood loss anemia    Transaminitis    Hypoalbuminemia due to protein-calorie malnutrition (HCC)    Labile blood pressure    Drug induced constipation    Postoperative pain    Subcapital fracture of femur ([place]) 10/30/2019   Post-operative state    Prerenal azotemia    Leukocytosis    Closed right hip fracture, initial encounter ([place]) 10/27/2019   Hoarseness 02/19/2018   Sensorineural  hearing loss (SNHL) of both ears 02/19/2018   Tinnitus, bilateral 02/19/2018   Cough 09/06/2017   Cough variant asthma 08/19/2017   Acute bronchitis 02/20/2016   Pulmonary air trapping 01/04/2016   Encounter for preoperative pulmonary examination 01/04/2016   NSIP (nonspecific interstitial pneumonia) ([place]) 09/26/2015   Chronic sinusitis 09/26/2015   ILD (interstitial lung disease) ([place]) 07/12/2014   Upper airway cough syndrome 09/23/2013   Encounter for therapeutic drug monitoring 08/11/2013   Congestive heart failure ([place]) 07/08/2013   Coronary artery calcification seen on CAT scan 03/15/2013   Nodule of left lung 03/15/2013   Orthostatic lightheadedness 02/09/2013   Depression    Diverticulitis 01/01/2012   GERD (gastroesophageal reflux disease) 01/01/2012   Hypothyroid 11/23/2010   Edema 10/04/2010   Bronchiectasis ([place])    HYPERTENSION, BENIGN 10/22/2008   A-fib ([place])    PAST MEDICAL HISTORY:  Active Ambulatory Problems    Diagnosis Date Noted   HYPERTENSION, BENIGN 10/22/2008   A-fib ([place])    Bronchiectasis ([place])    Edema 10/04/2010   Hypothyroid 11/23/2010   Diverticulitis 01/01/2012   GERD (gastroesophageal reflux disease) 01/01/2012   Depression    Orthostatic lightheadedness 02/09/2013   Coronary artery calcification seen on CAT scan 03/15/2013   Nodule of left lung 03/15/2013   Congestive heart failure ([place]) 07/08/2013   Encounter for therapeutic drug monitoring 08/11/2013   Upper airway cough syndrome 09/23/2013   ILD (interstitial lung disease) ([place]) 07/12/2014   NSIP (nonspecific interstitial pneumonia) ([place]) 09/26/2015   Chronic sinusitis 09/26/2015   Pulmonary air trapping 01/04/2016   Encounter for preoperative pulmonary examination 01/04/2016   Acute bronchitis 02/20/2016   Cough variant asthma 08/19/2017   Cough 09/06/2017   Hoarseness 02/19/2018   Sensorineural hearing loss (SNHL) of both ears 02/19/2018   Tinnitus, bilateral 02/19/2018   Closed  right hip fracture, initial encounter ([place]) 10/27/2019   Subcapital fracture of femur ([place]) 10/30/2019   Post-operative state    Prerenal azotemia    Leukocytosis    Acute blood loss anemia    Transaminitis    Hypoalbuminemia due to protein-calorie malnutrition (HCC)    Labile blood pressure    Drug induced constipation    Postoperative pain    Chronic anticoagulation    Essential hypertension    Chronic obstructive pulmonary disease ([place])    Periprosthetic fracture around internal prosthetic hip joint, initial encounter    Periprosthetic fracture around internal prosthetic hip joint 11/16/2019   Sepsis ([place]) 02/08/2020   Small bowel ischemia ([place]) 02/08/2020   Thrombocytosis 02/08/2020   Abnormal CT of the abdomen    Mesenteric ischemia ([place])    Debility 02/17/2020   Subtherapeutic international normalized ratio (INR)    Post-operative pain    S/P percutaneous endoscopic gastrostomy (PEG) tube placement (HCC)    Malnutrition of moderate degree 02/25/2020   Chronic pain  of right knee 06/20/2020   Abnormality of gait 06/20/2020   COVID-19 virus infection 06/17/2021   SOB (shortness of breath) 06/18/2021   Acute on chronic respiratory failure with hypoxia ([place]) 06/18/2021   Chronic diastolic CHF (congestive heart failure) ([place]) 06/18/2021   Chronic anemia 06/18/2021   Pressure injury of skin 06/21/2021   Physical deconditioning 06/21/2021   Disorder of cervical esophageal region 06/23/2021   Cricopharyngeal achalasia 06/24/2021   Palliative care encounter 07/27/2021   Resolved Ambulatory Problems    Diagnosis Date Noted   ANEMIA, IRON DEFICIENCY 10/22/2008   Foot pain 11/23/2010   URI, acute 06/19/2011   Sinusitis 08/02/2011   Hyponatremia 04/03/2012   Chest pain 09/03/2012   Preoperative clearance 02/12/2013   Post-nasal drip 03/15/2013   Bronchiectasis with acute exacerbation ([place]) 05/04/2013   Bronchiectasis without complication ([place]) 78/46/9629   Rhinitis, chronic  06/22/2014   Supplemental oxygen dependent    Vasovagal episode    Elevated WBC count    Severe sepsis ([place]) 06/18/2021   Past Medical History:  Diagnosis Date   Anemia    Anxiety    Atrial fibrillation (HCC)    Bronchiectasis    CHF (congestive heart failure) (HCC)    COPD (chronic obstructive pulmonary disease) ([place])    Diverticulosis    Dyslipidemia    Eczema    GERD with stricture    Glaucoma    H. pylori infection    Hypertension    IBS (irritable bowel syndrome)    Lumbar disc disease    Neuropathy of both feet    Segmental colitis ([place])    Vertigo    Wears glasses    SOCIAL HX:  Social History   Tobacco Use   Smoking status: Never   Smokeless tobacco: Never  Substance Use Topics   Alcohol use: No   FAMILY HX:  Family History  Problem Relation Age of Onset   Hypertension Mother    Stroke Mother    Emphysema Brother    Other Father        miner's lung   Cancer Father        Lung   Colon polyps Sister    Pancreatic cancer Sister    Kidney disease Sister    Atrial fibrillation Other        siblings       Preferred Pharmacy: [facility]:  Allergies  Allergen Reactions   Doxycycline Other (See Comments)    Rash on face and neck   Hydrocodone Other (See Comments)    Other reaction(s): hallucinations   Vicodin [Hydrocodone-Acetaminophen] Other (See Comments)    Hallucinations    Flagyl [Metronidazole] Nausea And Vomiting   Moxifloxacin Other (See Comments)     Weakness/fatigue   Pantoprazole Sodium Rash   Penicillins Rash    Has patient had a PCN reaction causing immediate rash, facial/tongue/throat swelling, SOB or lightheadedness with hypotension:unsure Has patient had a PCN reaction causing severe rash involving mucus membranes or skin necrosis:unsure Has patient had a PCN reaction that required hospitalization:No Has patient had a PCN reaction occurring within the last 10 years:Yes Cannot recall exact reaction due to time lapse If all of the  above answers are "NO", then may proceed with Cephalosporin use.  Other reaction(s): Unknown     PERTINENT MEDICATIONS:  Outpatient Encounter Medications as of 07/27/2021  Medication Sig   acetaminophen (TYLENOL) 325 MG tablet Take 2 tablets (650 mg total) by mouth every 6 (six) hours as needed for mild pain (or  Fever >/= 101).   apixaban (ELIQUIS) 2.5 MG TABS tablet Take 1 tablet (2.5 mg total) by mouth 2 (two) times daily.   FLUoxetine (PROZAC) 20 MG capsule Take 20 mg by mouth daily.   furosemide (LASIX) 20 MG tablet Take 20 mg by mouth daily.   hydrOXYzine (ATARAX/VISTARIL) 25 MG tablet Take 12.5-25 mg by mouth every 8 (eight) hours as needed for itching. (Patient not taking: Reported on 07/26/2021)   levothyroxine (SYNTHROID) 50 MCG tablet Take 1 tablet (50 mcg total) by mouth daily.   omeprazole (PRILOSEC) 40 MG capsule Take 1 capsule (40 mg total) by mouth 2 (two) times daily before a meal.   potassium chloride (KLOR-CON) 8 MEQ tablet Take 16 mEq by mouth daily.   pravastatin (PRAVACHOL) 40 MG tablet Take 1 tablet (40 mg total) by mouth at bedtime.   psyllium (METAMUCIL) 58.6 % packet Take 1 packet by mouth daily as needed (constipation).   SYMBICORT 80-4.5 MCG/ACT inhaler INHALE 2 PUFFS INTO THE LUNGS TWICE DAILY (Patient taking differently: Inhale 2 puffs into the lungs in the morning and at bedtime.)   No facility-administered encounter medications on file as of 07/27/2021.     ---------------------------------------------------------------------------------------------------------------------------------------------------------------------------------------------------------------- Advance Care Planning/Goals of Care: Goals include to maximize quality of life and symptom management. Patient gave her permission to discuss.Our advance care planning conversation included a discussion about:    The value and importance of advance care planning including Durable and HC POA, advance  directive/living will (Five Wishes) and DNR/MOST Experiences with loved ones who have been seriously ill or have died-pt's daughter has had personal experience with in-laws and niece who has had Hospice and passed away Exploration of personal, cultural or spiritual beliefs that might influence medical decisions- Pt believes that she will go to heaven after her death, wants to remain independent and believes she will drive again Exploration of goals of care in the event of a sudden injury or illness-DNR Identification of a healthcare agent-will likely designate daughter [name]  Review of MOST form and choices. Pt will discuss with family and have social worker return for her to sign Decision not to resuscitate or to de-escalate disease focused treatments due to poor prognosis. PT indicating she does not want CPR or intubation but wants to discuss with family.  CODE STATUS: DNR  MOST pending  I spent 45 minutes providing this consultation providing Palliative Care counseling on goals of care. More than 50% of the time in this consultation was spent in counseling and care coordination. Time 11:45 am-12:30 pm   Thank you for the opportunity to participate in the care of Ms. Canipe.  The palliative care team will continue to follow. Please call our office at [phone] if we can be of additional assistance.   Marijo Conception, FNP-C  COVID-19 PATIENT SCREENING TOOL Asked and negative response unless otherwise noted:  Have you had symptoms of covid, tested positive or been in contact with someone with symptoms/positive test in the past 5-10 days? no

## 2021-07-28 DIAGNOSIS — I482 Chronic atrial fibrillation, unspecified: Secondary | ICD-10-CM | POA: Diagnosis not present

## 2021-07-28 DIAGNOSIS — J9611 Chronic respiratory failure with hypoxia: Secondary | ICD-10-CM | POA: Diagnosis not present

## 2021-07-28 DIAGNOSIS — I110 Hypertensive heart disease with heart failure: Secondary | ICD-10-CM | POA: Diagnosis not present

## 2021-07-28 DIAGNOSIS — J449 Chronic obstructive pulmonary disease, unspecified: Secondary | ICD-10-CM | POA: Diagnosis not present

## 2021-07-28 DIAGNOSIS — I5032 Chronic diastolic (congestive) heart failure: Secondary | ICD-10-CM | POA: Diagnosis not present

## 2021-07-28 DIAGNOSIS — U071 COVID-19: Secondary | ICD-10-CM | POA: Diagnosis not present

## 2021-07-31 DIAGNOSIS — J449 Chronic obstructive pulmonary disease, unspecified: Secondary | ICD-10-CM | POA: Diagnosis not present

## 2021-07-31 DIAGNOSIS — I5032 Chronic diastolic (congestive) heart failure: Secondary | ICD-10-CM | POA: Diagnosis not present

## 2021-07-31 DIAGNOSIS — U071 COVID-19: Secondary | ICD-10-CM | POA: Diagnosis not present

## 2021-07-31 DIAGNOSIS — J9611 Chronic respiratory failure with hypoxia: Secondary | ICD-10-CM | POA: Diagnosis not present

## 2021-07-31 DIAGNOSIS — I110 Hypertensive heart disease with heart failure: Secondary | ICD-10-CM | POA: Diagnosis not present

## 2021-07-31 DIAGNOSIS — I482 Chronic atrial fibrillation, unspecified: Secondary | ICD-10-CM | POA: Diagnosis not present

## 2021-08-01 ENCOUNTER — Encounter: Payer: Self-pay | Admitting: *Deleted

## 2021-08-01 ENCOUNTER — Other Ambulatory Visit: Payer: Self-pay | Admitting: *Deleted

## 2021-08-01 DIAGNOSIS — J9611 Chronic respiratory failure with hypoxia: Secondary | ICD-10-CM | POA: Diagnosis not present

## 2021-08-01 DIAGNOSIS — I110 Hypertensive heart disease with heart failure: Secondary | ICD-10-CM | POA: Diagnosis not present

## 2021-08-01 DIAGNOSIS — I5032 Chronic diastolic (congestive) heart failure: Secondary | ICD-10-CM | POA: Diagnosis not present

## 2021-08-01 DIAGNOSIS — U071 COVID-19: Secondary | ICD-10-CM | POA: Diagnosis not present

## 2021-08-01 DIAGNOSIS — J449 Chronic obstructive pulmonary disease, unspecified: Secondary | ICD-10-CM | POA: Diagnosis not present

## 2021-08-01 DIAGNOSIS — I482 Chronic atrial fibrillation, unspecified: Secondary | ICD-10-CM | POA: Diagnosis not present

## 2021-08-01 NOTE — Patient Outreach (Signed)
[facility]) Care Management  08/01/2021  ESSENSE BOUSQUET 24-Feb-1934 [phone]   Weekly transition of care call placed to member's daughter, unsuccessful.  HIPAA compliant voice message left, will follow up within the next 3-4 business days.  Valente David, RN, MSN, [place] Manager [phone]

## 2021-08-02 ENCOUNTER — Telehealth: Payer: Self-pay | Admitting: Internal Medicine

## 2021-08-02 NOTE — Telephone Encounter (Signed)
Patient called and stated that she she has an appointment on 2/8 with Dr. Hilarie Fredrickson. Stated that she is having abd pain and is seeking advice if there is anything she can do to help her before her appointment. Please advise.

## 2021-08-03 DIAGNOSIS — U071 COVID-19: Secondary | ICD-10-CM | POA: Diagnosis not present

## 2021-08-03 DIAGNOSIS — J449 Chronic obstructive pulmonary disease, unspecified: Secondary | ICD-10-CM | POA: Diagnosis not present

## 2021-08-03 DIAGNOSIS — I110 Hypertensive heart disease with heart failure: Secondary | ICD-10-CM | POA: Diagnosis not present

## 2021-08-03 DIAGNOSIS — I482 Chronic atrial fibrillation, unspecified: Secondary | ICD-10-CM | POA: Diagnosis not present

## 2021-08-03 DIAGNOSIS — J9611 Chronic respiratory failure with hypoxia: Secondary | ICD-10-CM | POA: Diagnosis not present

## 2021-08-03 DIAGNOSIS — I5032 Chronic diastolic (congestive) heart failure: Secondary | ICD-10-CM | POA: Diagnosis not present

## 2021-08-03 NOTE — Telephone Encounter (Signed)
Certainly could be diverticulitis She has multiple antibiotic allergies most of which are actually just intolerances Would try Cipro 500 mg twice daily x5 to 7 days  She should keep her follow-up appointment

## 2021-08-03 NOTE — Telephone Encounter (Signed)
Left message for pt to call back.  Pt states she has been having pain in her LLQ for couple of days. She has not had a fever. States the pain is not bad when she is lying down but when she gets up and moves around she has the discomfort. Pt states she has had diverticulitis in the past. She has an OV scheduled for next week. Please advise.

## 2021-08-04 ENCOUNTER — Other Ambulatory Visit: Payer: Self-pay | Admitting: *Deleted

## 2021-08-04 ENCOUNTER — Other Ambulatory Visit: Payer: Self-pay

## 2021-08-04 DIAGNOSIS — J9611 Chronic respiratory failure with hypoxia: Secondary | ICD-10-CM | POA: Diagnosis not present

## 2021-08-04 DIAGNOSIS — U071 COVID-19: Secondary | ICD-10-CM | POA: Diagnosis not present

## 2021-08-04 DIAGNOSIS — I482 Chronic atrial fibrillation, unspecified: Secondary | ICD-10-CM | POA: Diagnosis not present

## 2021-08-04 DIAGNOSIS — J449 Chronic obstructive pulmonary disease, unspecified: Secondary | ICD-10-CM | POA: Diagnosis not present

## 2021-08-04 DIAGNOSIS — I5032 Chronic diastolic (congestive) heart failure: Secondary | ICD-10-CM | POA: Diagnosis not present

## 2021-08-04 DIAGNOSIS — I110 Hypertensive heart disease with heart failure: Secondary | ICD-10-CM | POA: Diagnosis not present

## 2021-08-04 MED ORDER — CIPROFLOXACIN HCL 500 MG PO TABS: 500.0000 mg | ORAL_TABLET | Freq: Two times a day (BID) | ORAL | 0 refills | Status: DC

## 2021-08-04 NOTE — Telephone Encounter (Signed)
Left message for pt to call back. Script sent to pharmacy. 

## 2021-08-04 NOTE — Telephone Encounter (Signed)
Spoke with Pt and she is aware of Dr. Vena Rua recommendations. She knows to pick up the prescription and take BID for 7 days. She also knows to keep her F/U appt as scheduled.

## 2021-08-04 NOTE — Patient Outreach (Signed)
[facility]) Care Management  08/04/2021  Stephanie Cortez [birth year] [phone]   Outreach attempt #2, unsuccessful, HIPAA compliant voice message left.  Will send outreach letter and follow up within the next 3-4 business days.  Valente David, RN, MSN, [place] Manager [phone]

## 2021-08-08 ENCOUNTER — Encounter: Payer: Self-pay | Admitting: *Deleted

## 2021-08-09 ENCOUNTER — Encounter: Payer: Self-pay | Admitting: Internal Medicine

## 2021-08-09 ENCOUNTER — Ambulatory Visit (INDEPENDENT_AMBULATORY_CARE_PROVIDER_SITE_OTHER): Payer: Medicare Other | Admitting: Internal Medicine

## 2021-08-09 VITALS — BP 120/62 | HR 88 | Ht 66.0 in | Wt 143.0 lb

## 2021-08-09 DIAGNOSIS — K5732 Diverticulitis of large intestine without perforation or abscess without bleeding: Secondary | ICD-10-CM | POA: Diagnosis not present

## 2021-08-09 DIAGNOSIS — R09A2 Foreign body sensation, throat: Secondary | ICD-10-CM

## 2021-08-09 DIAGNOSIS — K5900 Constipation, unspecified: Secondary | ICD-10-CM

## 2021-08-09 DIAGNOSIS — I482 Chronic atrial fibrillation, unspecified: Secondary | ICD-10-CM | POA: Diagnosis not present

## 2021-08-09 DIAGNOSIS — I5032 Chronic diastolic (congestive) heart failure: Secondary | ICD-10-CM | POA: Diagnosis not present

## 2021-08-09 DIAGNOSIS — R110 Nausea: Secondary | ICD-10-CM | POA: Diagnosis not present

## 2021-08-09 DIAGNOSIS — R0989 Other specified symptoms and signs involving the circulatory and respiratory systems: Secondary | ICD-10-CM

## 2021-08-09 DIAGNOSIS — J9611 Chronic respiratory failure with hypoxia: Secondary | ICD-10-CM | POA: Diagnosis not present

## 2021-08-09 DIAGNOSIS — I110 Hypertensive heart disease with heart failure: Secondary | ICD-10-CM | POA: Diagnosis not present

## 2021-08-09 DIAGNOSIS — K219 Gastro-esophageal reflux disease without esophagitis: Secondary | ICD-10-CM | POA: Diagnosis not present

## 2021-08-09 DIAGNOSIS — U071 COVID-19: Secondary | ICD-10-CM | POA: Diagnosis not present

## 2021-08-09 DIAGNOSIS — J449 Chronic obstructive pulmonary disease, unspecified: Secondary | ICD-10-CM | POA: Diagnosis not present

## 2021-08-09 DIAGNOSIS — R932 Abnormal findings on diagnostic imaging of liver and biliary tract: Secondary | ICD-10-CM | POA: Diagnosis not present

## 2021-08-09 NOTE — Patient Instructions (Addendum)
Continue Pantoprazole twice daily.   Use Tylenol as needed. No more than 3,000 mg in 24 hours.   Continue Metamucil as directed.  Start Pepcid 20mg  - 1 tablet by mouth at bedtime. Pepcid can be purchased over the counter.   Finish antibiotics as directed by Dr Hilarie Fredrickson.   Dr Hilarie Fredrickson would like to see back on late April 2023. Office will contact you to schedule at a later time.   If you are age 86 or older, your body mass index should be between 23-30. Your Body mass index is 23.08 kg/m. If this is out of the aforementioned range listed, please consider follow up with your Primary Care Provider.  If you are age 48 or younger, your body mass index should be between 19-25. Your Body mass index is 23.08 kg/m. If this is out of the aformentioned range listed, please consider follow up with your Primary Care Provider.   ________________________________________________________  The  GI providers would like to encourage you to use [facility] to communicate with providers for non-urgent requests or questions.  Due to long hold times on the telephone, sending your provider a message by [facility] may be a faster and more efficient way to get a response.  Please allow 48 business hours for a response.  Please remember that this is for non-urgent requests.  _______________________________________________________  Thank you for choosing me and [facility].  Dr.Jay Pyrtle

## 2021-08-09 NOTE — Progress Notes (Signed)
Subjective:    Patient ID: Stephanie Cortez, female    DOB: 14-Apr-1934, 88 y.o.   MRN: [id number]  HPI Stephanie Cortez is an 88 year old female with a history of GERD, cricopharyngeal achalasia (previously treated with Botox), history of segmental colitis associated with diverticulosis previously treated with mesalamine but none of late, small bowel ischemia requiring resection in 2021, history of ILD, A-fib on Eliquis, hypertension and recent hospitalization with COVID-19 who is here for follow-up.  She is here today with her daughter and I last saw her in October 2022.  She was hospitalized for 10 days with COVID-19 in December.  This required a brief stay [facility] rehab facility.  During this hospitalization she had hypoxia as well as dysphagia.  She has been receiving home health.  On the whole she reports she is significantly better.  Her dysphagia has improved and she is eating normal food now.  She has lost about 20 pounds in association with this hospitalization and illness.  She still having some throat clearing and globus sensation but no heartburn.  Some morning nausea.  She is able to swallow her pills and food without pain or food "sticking".  She is using the omeprazole 40 mg twice daily.  Bowel movements have been back and forth a bit still some mild constipation.  She was having left lower quadrant abdominal pain and she is completing antibiotics for presumed diverticulitis.  This has definitively helped.  She still has intermittent right lower quadrant abdominal pain which is stable and not worsening.   Review of Systems As per HPI, otherwise negative  Current Medications, Allergies, Past Medical History, Past Surgical History, Family History and Social History were reviewed in [employer] record.    Objective:   Physical Exam BP 120/62    Pulse 88    Ht 5\' 6"  (1.676 m)    Wt 143 lb (64.9 kg)    LMP  (LMP Unknown)    SpO2 93%    BMI 23.08 kg/m  Gen: awake,  alert, NAD HEENT: anicteric CV: Irregularly irregular Pulm: CTA b/l Abd: soft, NT/ND, +BS throughout Ext: no c/c/e Neuro: nonfocal  CT ABDOMEN AND PELVIS WITH CONTRAST   TECHNIQUE: Multidetector CT imaging of the abdomen and pelvis was performed using the standard protocol following bolus administration of intravenous contrast.   CONTRAST:  66mL OMNIPAQUE IOHEXOL 350 MG/ML SOLN   COMPARISON:  CT the abdomen and pelvis 02/08/2020.   FINDINGS: Lower chest: Cardiomegaly with severe right atrial dilatation. Widespread areas of patchy ground-glass attenuation and interlobular septal thickening throughout the visualize lung bases. Atherosclerotic calcifications in the descending thoracic aorta. Calcifications of the mitral annulus. Mild calcifications of the aortic valve.   Hepatobiliary: Liver has a nodular contour, suggesting underlying cirrhosis. There are heterogeneous areas of hypoperfusion in the liver, most evident throughout the right hepatic lobe on the portal venous phase of the examination, which appear to resolve on the delayed images (although the liver is incompletely imaged on the delayed images), presumably a benign perfusion anomaly. No definite suspicious cystic or solid hepatic lesions are confidently identified on today's examination. No intra or extrahepatic biliary ductal dilatation. Gallbladder is nearly decompressed, but otherwise unremarkable in appearance.   Pancreas: No pancreatic mass. No pancreatic ductal dilatation. No pancreatic or peripancreatic fluid collections or inflammatory changes.   Spleen: Calcified granulomas are noted in the spleen.   Adrenals/Urinary Tract: Subcentimeter low-attenuation lesion in the upper pole of the right kidney, 1.5 cm low-attenuation lesion  in the lower pole of the left kidney, compatible with a simple cyst. Other subcentimeter low-attenuation lesions in the lower pole of the left kidney and upper pole the right  kidney, too small to characterize, but statistically likely to represent small cysts. No definite aggressive appearing renal lesions are noted. Bilateral adrenal glands are normal in appearance. No hydroureteronephrosis. Urinary bladder is unremarkable in appearance.   Stomach/Bowel: Normal appearance of the stomach. No pathologic dilatation of small bowel or colon. Numerous colonic diverticulae are noted, particularly in the descending colon and sigmoid colon, without surrounding inflammatory changes to suggest an acute diverticulitis at this time. The appendix is not confidently identified and may be surgically absent. Regardless, there are no inflammatory changes noted adjacent to the cecum to suggest the presence of an acute appendicitis at this time.   Vascular/Lymphatic: Extensive aortic atherosclerosis, without aneurysm or dissection noted in the abdominal or pelvic vasculature. No lymphadenopathy noted in the abdomen or pelvis.   Reproductive: Status post hysterectomy. Ovaries are not confidently identified may be surgically absent or atrophic.   Other: Tiny ventral hernia through the anterior pelvic wall (axial image 60 of series 2) containing only fat. No significant volume of ascites. No pneumoperitoneum.   Musculoskeletal: There are no aggressive appearing lytic or blastic lesions noted in the visualized portions of the skeleton. Status post right hip arthroplasty.   IMPRESSION: 1. No acute findings are noted in the abdomen or pelvis to account for the patient's symptoms. 2. Tiny ventral hernia in the anterior pelvic wall containing only a small amount of fat. No associated bowel incarceration or obstruction at this time. 3. Cardiomegaly with severe right atrial dilatation. Findings in the lung bases suggest pulmonary edema. Clinical correlation for signs and symptoms of congestive heart failure is recommended. 4. Probable passive hepatic congestion resulting and  perfusion anomalies throughout the right lobe of the liver. Liver has a nodular contour, suggesting underlying cirrhosis. No discrete hepatic lesions are confidently identified on today's examination. 5. Aortic atherosclerosis. 6. Additional incidental findings, as above.     Electronically Signed   By: Vinnie Langton M.D.   On: 04/10/2021 08:00    ULTRASOUND ABDOMEN LIMITED RIGHT UPPER QUADRANT   COMPARISON:  None.   FINDINGS: Gallbladder:   No gallstones or wall thickening visualized. No sonographic Murphy sign noted by sonographer.   Common bile duct:   Diameter: 7 mm   Liver:   There is mild diffuse increased liver echogenicity most commonly seen in the setting of fatty infiltration. Superimposed inflammation or fibrosis is not excluded. Clinical correlation is recommended. Portal vein is patent on color Doppler imaging with normal direction of blood flow towards the liver.   Other: None.   IMPRESSION: Mild fatty liver, otherwise unremarkable right upper quadrant ultrasound.     Electronically Signed   By: Anner Crete M.D.   On: 04/22/2021 02:56  ESOPHOGRAM/BARIUM SWALLOW   TECHNIQUE: Single contrast examination was performed using  thin barium.   FLUOROSCOPY TIME:  Fluoroscopy Time:  54 seconds   Number of Acquired Spot Images: 6   COMPARISON:  None.   FINDINGS: Moderate esophageal dysmotility.   Contrast transiently/intermittently passes via the GE junction into the stomach, confirming patency (series 3/image 129), although narrowing/stenosis is technically possible in this region.   Majority of contrast remained backed up within the esophagus.   IMPRESSION: Moderate esophageal dysmotility, with only transient/intermittent passage of contrast via the GE junction, confirming patency but suggesting mild achalasia.   Narrowing/stenosis at  the GE junction is possible.     Electronically Signed   By: Julian Hy M.D.   On:  06/23/2021 16:07        Assessment & Plan:  88 year old female with a history of GERD, cricopharyngeal achalasia (previously treated with Botox), history of segmental colitis associated with diverticulosis previously treated with mesalamine but none of late, small bowel ischemia requiring resection in 2021, history of ILD, A-fib on Eliquis, hypertension and recent hospitalization with COVID-19 who is here for follow-up.   GERD/globus sensation/throat clearing/nausea/history of cricopharyngeal achalasia--she had a setback with COVID-19 and an exacerbation of dysphagia likely related to weakness.  We performed a barium esophagram which confirmed esophageal dysmotility but also raised the question of mild achalasia.  On the whole she is swallowing better and making slow progress.  I am going to continue medical therapy and I am not recommending endoscopy at this time --Continue omeprazole 40 mg twice daily AC --Add famotidine 20 mg nightly  2.  Right lower quadrant abdominal pain --stable and highly suspicious for referred pain from her right hip.  No CT findings concerning to explain this pain.  Monitor for now.  3.  Left lower quadrant pain/presumed diverticulitis --abdomen is benign today by exam.  Pain is improved with antibiotics.  Complete metronidazole and ciprofloxacin therapy.  Notify me if this worsens.  4.  Mild constipation --presumed Metamucil on a daily basis  5.  Question of hepatic congestion/cirrhosis --no evidence of advanced liver disease or decompensated liver disease.  We discussed the imaging findings today.  I am not recommending additional evaluation at this time.  When last checked her liver enzymes were normal.  Her albumin did acutely drop while she was hospitalized with COVID-19 which is not unexpected. --Supportive care and monitor going forward --Okay for Tylenol up to 1 g 3 times a day  Follow-up with me in 2 to 3 months, sooner if needed  40 minutes total spent  today including patient facing time, coordination of care, reviewing medical history/procedures/pertinent radiology studies, and documentation of the encounter.

## 2021-08-10 ENCOUNTER — Other Ambulatory Visit: Payer: Self-pay | Admitting: *Deleted

## 2021-08-10 DIAGNOSIS — J9611 Chronic respiratory failure with hypoxia: Secondary | ICD-10-CM | POA: Diagnosis not present

## 2021-08-10 DIAGNOSIS — J449 Chronic obstructive pulmonary disease, unspecified: Secondary | ICD-10-CM | POA: Diagnosis not present

## 2021-08-10 DIAGNOSIS — I482 Chronic atrial fibrillation, unspecified: Secondary | ICD-10-CM | POA: Diagnosis not present

## 2021-08-10 DIAGNOSIS — I110 Hypertensive heart disease with heart failure: Secondary | ICD-10-CM | POA: Diagnosis not present

## 2021-08-10 DIAGNOSIS — I5032 Chronic diastolic (congestive) heart failure: Secondary | ICD-10-CM | POA: Diagnosis not present

## 2021-08-10 DIAGNOSIS — U071 COVID-19: Secondary | ICD-10-CM | POA: Diagnosis not present

## 2021-08-10 NOTE — Patient Outreach (Signed)
[place] [facility]) Care Management  08/10/2021  Stephanie Cortez [birth year] [id number]   Outreach attempt #3, unsuccessful to member's daughter [name], HIPAA compliant voice message left.  Will make 4th and final attempt within the next 3 weeks, if remain unsuccessful will close case due to inability to maintain contact.  Valente David, RN, MSN, [place] Manager [phone]

## 2021-08-11 ENCOUNTER — Encounter: Payer: Self-pay | Admitting: Internal Medicine

## 2021-08-11 DIAGNOSIS — J449 Chronic obstructive pulmonary disease, unspecified: Secondary | ICD-10-CM | POA: Diagnosis not present

## 2021-08-11 DIAGNOSIS — I110 Hypertensive heart disease with heart failure: Secondary | ICD-10-CM | POA: Diagnosis not present

## 2021-08-11 DIAGNOSIS — I5032 Chronic diastolic (congestive) heart failure: Secondary | ICD-10-CM | POA: Diagnosis not present

## 2021-08-11 DIAGNOSIS — U071 COVID-19: Secondary | ICD-10-CM | POA: Diagnosis not present

## 2021-08-11 DIAGNOSIS — I482 Chronic atrial fibrillation, unspecified: Secondary | ICD-10-CM | POA: Diagnosis not present

## 2021-08-11 DIAGNOSIS — J9611 Chronic respiratory failure with hypoxia: Secondary | ICD-10-CM | POA: Diagnosis not present

## 2021-08-15 DIAGNOSIS — U071 COVID-19: Secondary | ICD-10-CM | POA: Diagnosis not present

## 2021-08-15 DIAGNOSIS — J9611 Chronic respiratory failure with hypoxia: Secondary | ICD-10-CM | POA: Diagnosis not present

## 2021-08-15 DIAGNOSIS — I482 Chronic atrial fibrillation, unspecified: Secondary | ICD-10-CM | POA: Diagnosis not present

## 2021-08-15 DIAGNOSIS — I5032 Chronic diastolic (congestive) heart failure: Secondary | ICD-10-CM | POA: Diagnosis not present

## 2021-08-15 DIAGNOSIS — J449 Chronic obstructive pulmonary disease, unspecified: Secondary | ICD-10-CM | POA: Diagnosis not present

## 2021-08-15 DIAGNOSIS — I110 Hypertensive heart disease with heart failure: Secondary | ICD-10-CM | POA: Diagnosis not present

## 2021-08-16 DIAGNOSIS — I482 Chronic atrial fibrillation, unspecified: Secondary | ICD-10-CM | POA: Diagnosis not present

## 2021-08-16 DIAGNOSIS — I110 Hypertensive heart disease with heart failure: Secondary | ICD-10-CM | POA: Diagnosis not present

## 2021-08-16 DIAGNOSIS — I5032 Chronic diastolic (congestive) heart failure: Secondary | ICD-10-CM | POA: Diagnosis not present

## 2021-08-16 DIAGNOSIS — J9611 Chronic respiratory failure with hypoxia: Secondary | ICD-10-CM | POA: Diagnosis not present

## 2021-08-16 DIAGNOSIS — U071 COVID-19: Secondary | ICD-10-CM | POA: Diagnosis not present

## 2021-08-16 DIAGNOSIS — J449 Chronic obstructive pulmonary disease, unspecified: Secondary | ICD-10-CM | POA: Diagnosis not present

## 2021-08-17 DIAGNOSIS — J9611 Chronic respiratory failure with hypoxia: Secondary | ICD-10-CM | POA: Diagnosis not present

## 2021-08-17 DIAGNOSIS — I110 Hypertensive heart disease with heart failure: Secondary | ICD-10-CM | POA: Diagnosis not present

## 2021-08-17 DIAGNOSIS — I482 Chronic atrial fibrillation, unspecified: Secondary | ICD-10-CM | POA: Diagnosis not present

## 2021-08-17 DIAGNOSIS — I5032 Chronic diastolic (congestive) heart failure: Secondary | ICD-10-CM | POA: Diagnosis not present

## 2021-08-17 DIAGNOSIS — U071 COVID-19: Secondary | ICD-10-CM | POA: Diagnosis not present

## 2021-08-17 DIAGNOSIS — J449 Chronic obstructive pulmonary disease, unspecified: Secondary | ICD-10-CM | POA: Diagnosis not present

## 2021-08-20 DIAGNOSIS — R1314 Dysphagia, pharyngoesophageal phase: Secondary | ICD-10-CM | POA: Diagnosis not present

## 2021-08-20 DIAGNOSIS — F329 Major depressive disorder, single episode, unspecified: Secondary | ICD-10-CM | POA: Diagnosis not present

## 2021-08-20 DIAGNOSIS — D649 Anemia, unspecified: Secondary | ICD-10-CM | POA: Diagnosis not present

## 2021-08-20 DIAGNOSIS — Z9181 History of falling: Secondary | ICD-10-CM | POA: Diagnosis not present

## 2021-08-20 DIAGNOSIS — K220 Achalasia of cardia: Secondary | ICD-10-CM | POA: Diagnosis not present

## 2021-08-20 DIAGNOSIS — I110 Hypertensive heart disease with heart failure: Secondary | ICD-10-CM | POA: Diagnosis not present

## 2021-08-20 DIAGNOSIS — J9611 Chronic respiratory failure with hypoxia: Secondary | ICD-10-CM | POA: Diagnosis not present

## 2021-08-20 DIAGNOSIS — Z7951 Long term (current) use of inhaled steroids: Secondary | ICD-10-CM | POA: Diagnosis not present

## 2021-08-20 DIAGNOSIS — J449 Chronic obstructive pulmonary disease, unspecified: Secondary | ICD-10-CM | POA: Diagnosis not present

## 2021-08-20 DIAGNOSIS — I5032 Chronic diastolic (congestive) heart failure: Secondary | ICD-10-CM | POA: Diagnosis not present

## 2021-08-20 DIAGNOSIS — K5790 Diverticulosis of intestine, part unspecified, without perforation or abscess without bleeding: Secondary | ICD-10-CM | POA: Diagnosis not present

## 2021-08-20 DIAGNOSIS — U071 COVID-19: Secondary | ICD-10-CM | POA: Diagnosis not present

## 2021-08-20 DIAGNOSIS — Z8701 Personal history of pneumonia (recurrent): Secondary | ICD-10-CM | POA: Diagnosis not present

## 2021-08-20 DIAGNOSIS — K219 Gastro-esophageal reflux disease without esophagitis: Secondary | ICD-10-CM | POA: Diagnosis not present

## 2021-08-20 DIAGNOSIS — Z7901 Long term (current) use of anticoagulants: Secondary | ICD-10-CM | POA: Diagnosis not present

## 2021-08-20 DIAGNOSIS — J9621 Acute and chronic respiratory failure with hypoxia: Secondary | ICD-10-CM | POA: Diagnosis not present

## 2021-08-20 DIAGNOSIS — I482 Chronic atrial fibrillation, unspecified: Secondary | ICD-10-CM | POA: Diagnosis not present

## 2021-08-20 DIAGNOSIS — E785 Hyperlipidemia, unspecified: Secondary | ICD-10-CM | POA: Diagnosis not present

## 2021-08-20 DIAGNOSIS — K589 Irritable bowel syndrome without diarrhea: Secondary | ICD-10-CM | POA: Diagnosis not present

## 2021-08-20 DIAGNOSIS — E039 Hypothyroidism, unspecified: Secondary | ICD-10-CM | POA: Diagnosis not present

## 2021-08-22 DIAGNOSIS — J449 Chronic obstructive pulmonary disease, unspecified: Secondary | ICD-10-CM | POA: Diagnosis not present

## 2021-08-22 DIAGNOSIS — I5032 Chronic diastolic (congestive) heart failure: Secondary | ICD-10-CM | POA: Diagnosis not present

## 2021-08-22 DIAGNOSIS — U071 COVID-19: Secondary | ICD-10-CM | POA: Diagnosis not present

## 2021-08-22 DIAGNOSIS — J9611 Chronic respiratory failure with hypoxia: Secondary | ICD-10-CM | POA: Diagnosis not present

## 2021-08-22 DIAGNOSIS — I482 Chronic atrial fibrillation, unspecified: Secondary | ICD-10-CM | POA: Diagnosis not present

## 2021-08-22 DIAGNOSIS — I110 Hypertensive heart disease with heart failure: Secondary | ICD-10-CM | POA: Diagnosis not present

## 2021-08-23 DIAGNOSIS — I482 Chronic atrial fibrillation, unspecified: Secondary | ICD-10-CM | POA: Diagnosis not present

## 2021-08-23 DIAGNOSIS — U071 COVID-19: Secondary | ICD-10-CM | POA: Diagnosis not present

## 2021-08-23 DIAGNOSIS — I110 Hypertensive heart disease with heart failure: Secondary | ICD-10-CM | POA: Diagnosis not present

## 2021-08-23 DIAGNOSIS — J449 Chronic obstructive pulmonary disease, unspecified: Secondary | ICD-10-CM | POA: Diagnosis not present

## 2021-08-23 DIAGNOSIS — I5032 Chronic diastolic (congestive) heart failure: Secondary | ICD-10-CM | POA: Diagnosis not present

## 2021-08-23 DIAGNOSIS — J9611 Chronic respiratory failure with hypoxia: Secondary | ICD-10-CM | POA: Diagnosis not present

## 2021-08-28 DIAGNOSIS — J9611 Chronic respiratory failure with hypoxia: Secondary | ICD-10-CM | POA: Diagnosis not present

## 2021-08-28 DIAGNOSIS — I482 Chronic atrial fibrillation, unspecified: Secondary | ICD-10-CM | POA: Diagnosis not present

## 2021-08-28 DIAGNOSIS — I5032 Chronic diastolic (congestive) heart failure: Secondary | ICD-10-CM | POA: Diagnosis not present

## 2021-08-28 DIAGNOSIS — I110 Hypertensive heart disease with heart failure: Secondary | ICD-10-CM | POA: Diagnosis not present

## 2021-08-28 DIAGNOSIS — U071 COVID-19: Secondary | ICD-10-CM | POA: Diagnosis not present

## 2021-08-28 DIAGNOSIS — J449 Chronic obstructive pulmonary disease, unspecified: Secondary | ICD-10-CM | POA: Diagnosis not present

## 2021-08-29 ENCOUNTER — Other Ambulatory Visit: Payer: Self-pay | Admitting: *Deleted

## 2021-08-29 DIAGNOSIS — J449 Chronic obstructive pulmonary disease, unspecified: Secondary | ICD-10-CM | POA: Diagnosis not present

## 2021-08-29 DIAGNOSIS — I5032 Chronic diastolic (congestive) heart failure: Secondary | ICD-10-CM | POA: Diagnosis not present

## 2021-08-29 DIAGNOSIS — I482 Chronic atrial fibrillation, unspecified: Secondary | ICD-10-CM | POA: Diagnosis not present

## 2021-08-29 DIAGNOSIS — J9611 Chronic respiratory failure with hypoxia: Secondary | ICD-10-CM | POA: Diagnosis not present

## 2021-08-29 DIAGNOSIS — I110 Hypertensive heart disease with heart failure: Secondary | ICD-10-CM | POA: Diagnosis not present

## 2021-08-29 DIAGNOSIS — U071 COVID-19: Secondary | ICD-10-CM | POA: Diagnosis not present

## 2021-08-29 NOTE — Patient Outreach (Signed)
[place] [facility]) Care Management  08/29/2021  MATELYN ANTONELLI June 25, 1934 [id number]   Outreach attempt #4 to daughter, no answer, HIPAA compliant voice message left.  No response from member after multiple unsuccessful outreach attempts and letter sent.  Will close case at this time due to inability to maintain contact.  Will notify member and primary MD of case closure.  Valente David, RN, MSN, [place] Manager [phone]

## 2021-09-04 DIAGNOSIS — I110 Hypertensive heart disease with heart failure: Secondary | ICD-10-CM | POA: Diagnosis not present

## 2021-09-04 DIAGNOSIS — U071 COVID-19: Secondary | ICD-10-CM | POA: Diagnosis not present

## 2021-09-04 DIAGNOSIS — J449 Chronic obstructive pulmonary disease, unspecified: Secondary | ICD-10-CM | POA: Diagnosis not present

## 2021-09-04 DIAGNOSIS — I5032 Chronic diastolic (congestive) heart failure: Secondary | ICD-10-CM | POA: Diagnosis not present

## 2021-09-04 DIAGNOSIS — I482 Chronic atrial fibrillation, unspecified: Secondary | ICD-10-CM | POA: Diagnosis not present

## 2021-09-04 DIAGNOSIS — J9611 Chronic respiratory failure with hypoxia: Secondary | ICD-10-CM | POA: Diagnosis not present

## 2021-09-05 DIAGNOSIS — I482 Chronic atrial fibrillation, unspecified: Secondary | ICD-10-CM | POA: Diagnosis not present

## 2021-09-05 DIAGNOSIS — J449 Chronic obstructive pulmonary disease, unspecified: Secondary | ICD-10-CM | POA: Diagnosis not present

## 2021-09-05 DIAGNOSIS — U071 COVID-19: Secondary | ICD-10-CM | POA: Diagnosis not present

## 2021-09-05 DIAGNOSIS — J9611 Chronic respiratory failure with hypoxia: Secondary | ICD-10-CM | POA: Diagnosis not present

## 2021-09-05 DIAGNOSIS — I110 Hypertensive heart disease with heart failure: Secondary | ICD-10-CM | POA: Diagnosis not present

## 2021-09-05 DIAGNOSIS — I5032 Chronic diastolic (congestive) heart failure: Secondary | ICD-10-CM | POA: Diagnosis not present

## 2021-09-12 DIAGNOSIS — I5032 Chronic diastolic (congestive) heart failure: Secondary | ICD-10-CM | POA: Diagnosis not present

## 2021-09-12 DIAGNOSIS — I110 Hypertensive heart disease with heart failure: Secondary | ICD-10-CM | POA: Diagnosis not present

## 2021-09-12 DIAGNOSIS — U071 COVID-19: Secondary | ICD-10-CM | POA: Diagnosis not present

## 2021-09-12 DIAGNOSIS — J9611 Chronic respiratory failure with hypoxia: Secondary | ICD-10-CM | POA: Diagnosis not present

## 2021-09-12 DIAGNOSIS — I482 Chronic atrial fibrillation, unspecified: Secondary | ICD-10-CM | POA: Diagnosis not present

## 2021-09-12 DIAGNOSIS — J449 Chronic obstructive pulmonary disease, unspecified: Secondary | ICD-10-CM | POA: Diagnosis not present

## 2021-09-14 ENCOUNTER — Telehealth: Payer: Self-pay | Admitting: Internal Medicine

## 2021-09-14 DIAGNOSIS — I110 Hypertensive heart disease with heart failure: Secondary | ICD-10-CM | POA: Diagnosis not present

## 2021-09-14 DIAGNOSIS — I482 Chronic atrial fibrillation, unspecified: Secondary | ICD-10-CM | POA: Diagnosis not present

## 2021-09-14 DIAGNOSIS — U071 COVID-19: Secondary | ICD-10-CM | POA: Diagnosis not present

## 2021-09-14 DIAGNOSIS — J9611 Chronic respiratory failure with hypoxia: Secondary | ICD-10-CM | POA: Diagnosis not present

## 2021-09-14 DIAGNOSIS — I5032 Chronic diastolic (congestive) heart failure: Secondary | ICD-10-CM | POA: Diagnosis not present

## 2021-09-14 DIAGNOSIS — J449 Chronic obstructive pulmonary disease, unspecified: Secondary | ICD-10-CM | POA: Diagnosis not present

## 2021-09-14 NOTE — Telephone Encounter (Signed)
Pt wanted Dr. Hilarie Fredrickson to order rehab for her hip so she can get off of her walker. Explained our office does not order that type of rehab and that she would need to contact her PCP regarding a referral. ?

## 2021-09-14 NOTE — Telephone Encounter (Signed)
Inbound call from patient would like a call back. States she have been experiencing pain her abd and would like to discuss possible rehab  ?

## 2021-09-15 ENCOUNTER — Telehealth: Payer: Self-pay | Admitting: Internal Medicine

## 2021-09-15 DIAGNOSIS — J449 Chronic obstructive pulmonary disease, unspecified: Secondary | ICD-10-CM | POA: Diagnosis not present

## 2021-09-15 DIAGNOSIS — J9611 Chronic respiratory failure with hypoxia: Secondary | ICD-10-CM | POA: Diagnosis not present

## 2021-09-15 DIAGNOSIS — J849 Interstitial pulmonary disease, unspecified: Secondary | ICD-10-CM

## 2021-09-15 DIAGNOSIS — R5381 Other malaise: Secondary | ICD-10-CM

## 2021-09-15 DIAGNOSIS — I110 Hypertensive heart disease with heart failure: Secondary | ICD-10-CM | POA: Diagnosis not present

## 2021-09-15 DIAGNOSIS — I5032 Chronic diastolic (congestive) heart failure: Secondary | ICD-10-CM | POA: Diagnosis not present

## 2021-09-15 DIAGNOSIS — I482 Chronic atrial fibrillation, unspecified: Secondary | ICD-10-CM | POA: Diagnosis not present

## 2021-09-15 DIAGNOSIS — U071 COVID-19: Secondary | ICD-10-CM | POA: Diagnosis not present

## 2021-09-15 NOTE — Telephone Encounter (Signed)
I reviewed the patient's chart. I do not see any telephone notes or OV notes stating rehab was ordered by MR. Patient was last seen June 2022 and does not have any appts scheduled for MR.  ? ?MR, please advise if you are ok with the occupational or physical therapy order. Thanks!  ?

## 2021-09-17 NOTE — Telephone Encounter (Signed)
She got deconditioned after some health set backs. Ok to get OT/PT as she desires and at home if that is what she wants. However, please arrange followup 30 min. She has ILD ?

## 2021-09-18 DIAGNOSIS — Z20822 Contact with and (suspected) exposure to covid-19: Secondary | ICD-10-CM | POA: Diagnosis not present

## 2021-09-18 NOTE — Telephone Encounter (Signed)
Called Kelly from [facility] but she did not answer. Left message for her to call back.  ? ?Also called patient but she did not answer. Left message for her to call us back.  ?

## 2021-09-18 NOTE — Telephone Encounter (Signed)
Kelly from [facility] care returned call, she states she is being discharged and she is at her baseline level of function.  She is no longer home bound and the family would like for her to go to outpatient therapy at [facility] in [place].  The fax # is [phone].  They will need her last clinic note, referral and her demographics.  Referral sent to [facility] as patient/family are on board with her going to outpatient, she has transportation.  Nothing further needed. ? ?Dr. Chase Caller, ?Referral for PT/OT is being sent to outpatient as patient is no longer homebound and is at her baseline of function.  Patient and family are on board with her going to outpatient therapy.  New referral sent to outpatient.  Thank you. ?

## 2021-09-26 DIAGNOSIS — R5381 Other malaise: Secondary | ICD-10-CM | POA: Diagnosis not present

## 2021-09-26 DIAGNOSIS — J849 Interstitial pulmonary disease, unspecified: Secondary | ICD-10-CM | POA: Diagnosis not present

## 2021-09-29 ENCOUNTER — Encounter: Payer: Self-pay | Admitting: Internal Medicine

## 2021-09-29 DIAGNOSIS — J849 Interstitial pulmonary disease, unspecified: Secondary | ICD-10-CM

## 2021-09-29 DIAGNOSIS — R5381 Other malaise: Secondary | ICD-10-CM

## 2021-09-29 NOTE — Telephone Encounter (Signed)
MR, please see pt's email regarding her PT. She would like to change locations due to the drive. Thanks! ?

## 2021-10-02 NOTE — Telephone Encounter (Signed)
Yes this is fine. Also, I will see her 11/14/21 ?

## 2021-10-11 DIAGNOSIS — H04123 Dry eye syndrome of bilateral lacrimal glands: Secondary | ICD-10-CM | POA: Diagnosis not present

## 2021-10-24 DIAGNOSIS — J849 Interstitial pulmonary disease, unspecified: Secondary | ICD-10-CM | POA: Diagnosis not present

## 2021-10-24 DIAGNOSIS — I5032 Chronic diastolic (congestive) heart failure: Secondary | ICD-10-CM | POA: Diagnosis not present

## 2021-10-24 DIAGNOSIS — I10 Essential (primary) hypertension: Secondary | ICD-10-CM | POA: Diagnosis not present

## 2021-10-24 DIAGNOSIS — F419 Anxiety disorder, unspecified: Secondary | ICD-10-CM | POA: Diagnosis not present

## 2021-10-24 DIAGNOSIS — E782 Mixed hyperlipidemia: Secondary | ICD-10-CM | POA: Diagnosis not present

## 2021-10-24 DIAGNOSIS — M7061 Trochanteric bursitis, right hip: Secondary | ICD-10-CM | POA: Diagnosis not present

## 2021-10-24 DIAGNOSIS — I482 Chronic atrial fibrillation, unspecified: Secondary | ICD-10-CM | POA: Diagnosis not present

## 2021-10-24 DIAGNOSIS — Z1389 Encounter for screening for other disorder: Secondary | ICD-10-CM | POA: Diagnosis not present

## 2021-10-24 DIAGNOSIS — R609 Edema, unspecified: Secondary | ICD-10-CM | POA: Diagnosis not present

## 2021-10-24 DIAGNOSIS — Z0000 Encounter for general adult medical examination without abnormal findings: Secondary | ICD-10-CM | POA: Diagnosis not present

## 2021-10-24 DIAGNOSIS — E039 Hypothyroidism, unspecified: Secondary | ICD-10-CM | POA: Diagnosis not present

## 2021-10-26 ENCOUNTER — Encounter: Payer: Self-pay | Admitting: Occupational Therapy

## 2021-10-26 ENCOUNTER — Ambulatory Visit: Payer: Medicare Other | Attending: Internal Medicine

## 2021-10-26 DIAGNOSIS — M6281 Muscle weakness (generalized): Secondary | ICD-10-CM | POA: Diagnosis not present

## 2021-10-26 DIAGNOSIS — R5381 Other malaise: Secondary | ICD-10-CM | POA: Diagnosis not present

## 2021-10-26 DIAGNOSIS — J849 Interstitial pulmonary disease, unspecified: Secondary | ICD-10-CM | POA: Insufficient documentation

## 2021-10-26 DIAGNOSIS — R262 Difficulty in walking, not elsewhere classified: Secondary | ICD-10-CM | POA: Diagnosis not present

## 2021-10-26 DIAGNOSIS — R2689 Other abnormalities of gait and mobility: Secondary | ICD-10-CM | POA: Diagnosis not present

## 2021-10-26 NOTE — Therapy (Signed)
[place] [facility] ?[address], [address] ?[place], [place], [address] ?Phone: [phone]   Fax:  [phone] ? ?Physical Therapy Evaluation ? ?Patient Details  ?Name: Stephanie Cortez ?MRN: [id number] ?Date of Birth: 04/21/1934 ?Referring Provider (PT): Brand Males, MD ? ? ?Encounter Date: 10/26/2021 ? ? PT End of Session - 10/26/21 1544   ? ? Visit Number 1   ? Number of Visits 16   ? Date for PT Re-Evaluation 12/21/21   ? Authorization Type Medicare A and B/ UHC Supplemental 2023   ? Progress Note Due on Visit 10   ? PT Start Time 3299   ? PT Stop Time 1640   ? PT Time Calculation (min) 57 min   ? Activity Tolerance Patient tolerated treatment well   ? Behavior During Therapy [facility] for tasks assessed/performed   ? ?  ?  ? ?  ? ? ?Past Medical History:  ?Diagnosis Date  ? Anemia   ? - Hgb 9.7gm% on 07/13/2008 in [place] -  Hgg 129gm% wiht normal irone levsl and ferritin 10/27/2008 in [place] Recurrent otitis/sinusitis  ? Anxiety   ? chronic BZ prn  ? Atrial fibrillation ([place])   ? chronic anticoag  ? Bronchiectasis   ? >PFT 07/13/2008 in [place] 1.9L/76%, FVC 2.45L/74, Ratio 79, TLC 121%, DLCO 64%  AE BRonchiectasis - Dec 2010.New Rx:  outpatient - Feb 2011 - Rx outpatient  ? CHF (congestive heart failure) ([place])   ? COPD (chronic obstructive pulmonary disease) ([place])   ? bronchiectasis  ? Cricopharyngeal achalasia   ? Depression   ? Diverticulosis   ? Dyslipidemia   ? Eczema   ? Fatty liver   ? GERD with stricture   ? Glaucoma   ? H. pylori infection   ? Hypertension   ? Hyponatremia   ? chronic, s/p endo eval 06/2012  ? Hypothyroid   ? IBS (irritable bowel syndrome)   ? Lumbar disc disease   ? Neuropathy of both feet   ? Segmental colitis ([place])   ? Ventral hernia   ? Vertigo   ? Wears glasses   ? ? ?Past Surgical History:  ?Procedure Laterality Date  ? BOWEL RESECTION N/A 02/08/2020  ? Procedure: SMALL BOWEL RESECTION;  Surgeon: Donnie Mesa, MD;  Location: [place];  Service: General;   Laterality: N/A;  ? BREAST SURGERY    ? br bx  ? CARDIAC CATHETERIZATION  07/01/2013  ? CATARACT EXTRACTION    ? both  ? COLONOSCOPY    ? ESOPHAGOSCOPY W/ BOTOX INJECTION  12/11/2011  ? Procedure: ESOPHAGOSCOPY WITH BOTOX INJECTION;  Surgeon: Rozetta Nunnery, MD;  Location: [place];  Service: ENT;  Laterality: N/A;  esophogoscopy with dilation, botox injection  ? FOOT SURGERY  03/14/2011  ? gastroc slide-rt  ? GASTROSTOMY N/A 02/08/2020  ? Procedure: INSERTION OF GASTROSTOMY TUBE;  Surgeon: Donnie Mesa, MD;  Location: [place];  Service: General;  Laterality: N/A;  ? HIP ARTHROPLASTY Right 10/28/2019  ? Procedure: ARTHROPLASTY BIPOLAR HIP (HEMIARTHROPLASTY);  Surgeon: Marybelle Killings, MD;  Location: [facility];  Service: Orthopedics;  Laterality: Right;  ? LAPAROTOMY N/A 02/08/2020  ? Procedure: EXPLORATORY LAPAROTOMY;  Surgeon: Donnie Mesa, MD;  Location: [place];  Service: General;  Laterality: N/A;  ? LYSIS OF ADHESION N/A 02/08/2020  ? Procedure: LYSIS OF ADHESIONS;  Surgeon: Donnie Mesa, MD;  Location: [place];  Service: General;  Laterality: N/A;  ? TOTAL ABDOMINAL HYSTERECTOMY    ?  TOTAL HIP REVISION Right 11/16/2019  ? Procedure: TOTAL HIP REVISION BIPOLAR TO CEMENTED BIPOLAR;  Surgeon: Marybelle Killings, MD;  Location: [place];  Service: Orthopedics;  Laterality: Right;  ? WISDOM TOOTH EXTRACTION    ? ? ?There were no vitals filed for this visit. ? ? ? Subjective Assessment - 10/26/21 1553   ? ? Subjective December of 2022 experiened COVID pna and has had functional decline since that time which has exacerbated hx of orthopedic injuries/surgeris and notes overall decline in mobility, balance, activity tolerance, and increased need for assistance.  Pt reports lateral right hip pain which has been increasing in intensity over the past few months and notes increased debility and pain-limited in activities   ? Patient is accompained by: Family member   daughter  ? Pertinent History right hip fx s/p THA and  required revision, COPD, A-fib, CHF   ? Limitations Lifting;Standing;Walking;House hold activities   ? How long can you sit comfortably? 15-30 min   ? How long can you stand comfortably? varies   ? Patient Stated Goals decrease right hip pain, improve strength/balance/walking   ? Currently in Pain? Yes   ? Pain Score 5    ? Pain Location Hip   ? Pain Orientation Right;Lateral;Posterior   ? Pain Descriptors / Indicators Stabbing   ? Pain Type Chronic pain   ? Pain Radiating Towards right buttocks, low back   ? Pain Onset More than a month ago   ? Pain Frequency Constant   ? Aggravating Factors  worse with standing, walking, stairs   ? ?  ?  ? ?  ? ? ? ? ? [facility] PT Assessment - 10/26/21 0001   ? ?  ? Assessment  ? Medical Diagnosis ILD/Deconditioning   ? Referring Provider (PT) Brand Males, MD   ? Prior Therapy recent HHPT   ?  ? Balance Screen  ? Has the patient fallen in the past 6 months No   ?  ? Home Environment  ? Living Environment Private residence   ? Living Arrangements Alone   ? Available Help at Discharge Family   ? Type of Home House   ? Home Access Stairs to enter   ? Entrance Stairs-Number of Steps 3   ? Entrance Stairs-Rails Can reach both   ? Home Layout One level   ? [place] - 2 wheels;Adaptive equipment   ? Adaptive Equipment Reacher;Long-handled shoe horn   ?  ? Prior Function  ? Level of Independence Independent with household mobility with device;Independent with basic ADLs   ? Vocation Retired   ?  ? Observation/Other Assessments  ? Observations right hip flexion contracture evident by observation, limited right knee flexion from quadricep tightness. RLE length 33"; LLE 34.5"   ?  ? Coordination  ? Gross Motor Movements are Fluid and Coordinated Yes   ?  ? Functional Tests  ? Functional tests Single leg stance   ?  ? Single Leg Stance  ? Comments unable   ?  ? ROM / Strength  ? AROM / PROM / Strength AROM;Strength   ?  ? AROM  ? AROM Assessment Site Hip;Knee   ? Right/Left Hip  Right   ? Right Hip Flexion 70   ? Right Hip Internal Rotation  5   ? Right/Left Knee Right   ? Right Knee Extension 5   lacking  ? Right Knee Flexion 92   ?  ? Strength  ? Overall Strength  Deficits   ? Overall Strength Comments RLE 3/5 gross strength; LLE 3+/5 gross strength   ?  ? Transfers  ? Transfers Sit to Stand   ? Sit to Stand 6: Modified independent (Device/Increase time)   ? Five time sit to stand comments  38.56 sec   ?  ? Ambulation/Gait  ? Ambulation/Gait Yes   ? Ambulation/Gait Assistance 6: Modified independent (Device/Increase time)   ? Assistive device Rolling walker   ? Gait Pattern Step-to pattern;Decreased stance time - right   ? Ambulation Surface Level;Indoor   ? Stairs Yes   ? Stairs Assistance 5: Supervision   ? Stair Management Technique Two rails;Step to pattern   ?  ? Standardized Balance Assessment  ? Standardized Balance Assessment Timed Up and Go Test   ?  ? Timed Up and Go Test  ? Normal TUG (seconds) 59.03   ? ?  ?  ? ?  ? ? ? ? ? ? ? ? ? ? ? ? ? ?Objective measurements completed on examination: See above findings.  ? ? ? ? ? ? ? ? ? ? ? ? ? ? PT Education - 10/26/21 1657   ? ? Education Details discussion regarding various assessment findings and activity limitations/deficits   ? Person(s) Educated Patient;Child(ren)   ? Methods Explanation   ? Comprehension Verbalized understanding   ? ?  ?  ? ?  ? ? ? PT Short Term Goals - 10/26/21 1704   ? ?  ? PT SHORT TERM GOAL #1  ? Title Patient will be independent in HEP to improve functional outcomes   ? Time 4   ? Period Weeks   ? Status New   ? Target Date 11/23/21   ?  ? PT SHORT TERM GOAL #2  ? Title Demonstrate right hip flexion to 90 degrees and right knee flexion to 100 degrees to improve body mechanics for transfers   ? Baseline 70 degrees hip flexion, 92 right knee flexion   ? Time 4   ? Period Weeks   ? Status New   ? Target Date 11/23/21   ?  ? PT SHORT TERM GOAL #3  ? Title Manifest improved BLE strength per time of 30 sec 5xSTS  test   ? Baseline 38.56"   ? Time 4   ? Period Weeks   ? Status New   ? Target Date 11/23/21   ? ?  ?  ? ?  ? ? ? ? PT Long Term Goals - 10/26/21 1706   ? ?  ? PT LONG TERM GOAL #1  ? Title Demo red

## 2021-10-31 ENCOUNTER — Ambulatory Visit: Payer: Medicare Other

## 2021-10-31 ENCOUNTER — Ambulatory Visit: Payer: Medicare Other | Attending: Internal Medicine | Admitting: Occupational Therapy

## 2021-10-31 DIAGNOSIS — R2681 Unsteadiness on feet: Secondary | ICD-10-CM

## 2021-10-31 DIAGNOSIS — R262 Difficulty in walking, not elsewhere classified: Secondary | ICD-10-CM | POA: Insufficient documentation

## 2021-10-31 DIAGNOSIS — R2689 Other abnormalities of gait and mobility: Secondary | ICD-10-CM | POA: Diagnosis not present

## 2021-10-31 DIAGNOSIS — M6281 Muscle weakness (generalized): Secondary | ICD-10-CM | POA: Diagnosis not present

## 2021-10-31 NOTE — Therapy (Signed)
?OUTPATIENT OCCUPATIONAL THERAPY NEURO EVALUATION ? ?Patient Name: Stephanie Cortez ?MRN: [id number] ?DOB:1933/12/30, 88 y.o., female ?Today's Date: 10/31/2021 ? ?PCP: [name], MD  ?REFERRING PROVIDER: Brand Males, MD  ? ? OT End of Session - 10/31/21 1533   ? ? Visit Number 1   ? Number of Visits 13   ? Date for OT Re-Evaluation 12/15/21   ? Authorization Type Medicare A & B   ? Progress Note Due on Visit 10   ? OT Start Time 1452   ? OT Stop Time 1533   ? OT Time Calculation (min) 41 min   ? Activity Tolerance Patient tolerated treatment well   ? Behavior During Therapy [facility] for tasks assessed/performed   ? ?  ?  ? ?  ? ? ?Past Medical History:  ?Diagnosis Date  ? Anemia   ? - Hgb 9.7gm% on 07/13/2008 in [place] -  Hgg 129gm% wiht normal irone levsl and ferritin 10/27/2008 in [place] Recurrent otitis/sinusitis  ? Anxiety   ? chronic BZ prn  ? Atrial fibrillation ([place])   ? chronic anticoag  ? Bronchiectasis   ? >PFT 07/13/2008 in [place] 1.9L/76%, FVC 2.45L/74, Ratio 79, TLC 121%, DLCO 64%  AE BRonchiectasis - Dec 2010.New Rx:  outpatient - Feb 2011 - Rx outpatient  ? CHF (congestive heart failure) ([place])   ? COPD (chronic obstructive pulmonary disease) ([place])   ? bronchiectasis  ? Cricopharyngeal achalasia   ? Depression   ? Diverticulosis   ? Dyslipidemia   ? Eczema   ? Fatty liver   ? GERD with stricture   ? Glaucoma   ? H. pylori infection   ? Hypertension   ? Hyponatremia   ? chronic, s/p endo eval 06/2012  ? Hypothyroid   ? IBS (irritable bowel syndrome)   ? Lumbar disc disease   ? Neuropathy of both feet   ? Segmental colitis ([place])   ? Ventral hernia   ? Vertigo   ? Wears glasses   ? ?Past Surgical History:  ?Procedure Laterality Date  ? BOWEL RESECTION N/A 02/08/2020  ? Procedure: SMALL BOWEL RESECTION;  Surgeon: Donnie Mesa, MD;  Location: [place];  Service: General;  Laterality: N/A;  ? BREAST SURGERY    ? br bx  ? CARDIAC CATHETERIZATION  07/01/2013  ? CATARACT EXTRACTION    ? both  ? COLONOSCOPY    ?  ESOPHAGOSCOPY W/ BOTOX INJECTION  12/11/2011  ? Procedure: ESOPHAGOSCOPY WITH BOTOX INJECTION;  Surgeon: Rozetta Nunnery, MD;  Location: [place];  Service: ENT;  Laterality: N/A;  esophogoscopy with dilation, botox injection  ? FOOT SURGERY  03/14/2011  ? gastroc slide-rt  ? GASTROSTOMY N/A 02/08/2020  ? Procedure: INSERTION OF GASTROSTOMY TUBE;  Surgeon: Donnie Mesa, MD;  Location: [place];  Service: General;  Laterality: N/A;  ? HIP ARTHROPLASTY Right 10/28/2019  ? Procedure: ARTHROPLASTY BIPOLAR HIP (HEMIARTHROPLASTY);  Surgeon: Marybelle Killings, MD;  [facility];  Service: Orthopedics;  Laterality: Right;  ? LAPAROTOMY N/A 02/08/2020  ? Procedure: EXPLORATORY LAPAROTOMY;  Surgeon: Donnie Mesa, MD;  Location: [place];  Service: General;  Laterality: N/A;  ? LYSIS OF ADHESION N/A 02/08/2020  ? Procedure: LYSIS OF ADHESIONS;  Surgeon: Donnie Mesa, MD;  Location: [place];  Service: General;  Laterality: N/A;  ? TOTAL ABDOMINAL HYSTERECTOMY    ? TOTAL HIP REVISION Right 11/16/2019  ? Procedure: TOTAL HIP REVISION BIPOLAR TO CEMENTED BIPOLAR;  Surgeon: Marybelle Killings, MD;  Location: [place];  Service: Orthopedics;  Laterality: Right;  ? WISDOM TOOTH EXTRACTION    ? ?Patient Active Problem List  ? Diagnosis Date Noted  ? Palliative care encounter 07/27/2021  ? Constipation 07/27/2021  ? Cricopharyngeal achalasia 06/24/2021  ? Disorder of cervical esophageal region 06/23/2021  ? Pressure injury of skin 06/21/2021  ? Physical deconditioning 06/21/2021  ? SOB (shortness of breath) 06/18/2021  ? Acute on chronic respiratory failure with hypoxia ([place]) 06/18/2021  ? Chronic diastolic CHF (congestive heart failure) ([place]) 06/18/2021  ? Chronic anemia 06/18/2021  ? COVID-19 virus infection 06/17/2021  ? Chronic pain of right knee 06/20/2020  ? Abnormality of gait 06/20/2020  ? Malnutrition of moderate degree 02/25/2020  ? S/P percutaneous endoscopic gastrostomy (PEG) tube placement ([place])   ? Post-operative pain    ? Subtherapeutic international normalized ratio (INR)   ? Debility 02/17/2020  ? Mesenteric ischemia ([place])   ? Sepsis ([place]) 02/08/2020  ? Small bowel ischemia ([place]) 02/08/2020  ? Thrombocytosis 02/08/2020  ? Abnormal CT of the abdomen   ? Periprosthetic fracture around internal prosthetic hip joint 11/16/2019  ? Periprosthetic fracture around internal prosthetic hip joint, initial encounter   ? Chronic obstructive pulmonary disease (HCC)   ? Essential hypertension   ? Chronic anticoagulation   ? Acute blood loss anemia   ? Transaminitis   ? Hypoalbuminemia due to protein-calorie malnutrition ([place])   ? Labile blood pressure   ? Drug induced constipation   ? Postoperative pain   ? Subcapital fracture of femur ([place]) 10/30/2019  ? Post-operative state   ? Prerenal azotemia   ? Leukocytosis   ? Closed right hip fracture, initial encounter ([place]) 10/27/2019  ? Hoarseness 02/19/2018  ? Sensorineural hearing loss (SNHL) of both ears 02/19/2018  ? Tinnitus, bilateral 02/19/2018  ? Cough 09/06/2017  ? Cough variant asthma 08/19/2017  ? Acute bronchitis 02/20/2016  ? Pulmonary air trapping 01/04/2016  ? Encounter for preoperative pulmonary examination 01/04/2016  ? NSIP (nonspecific interstitial pneumonia) ([place]) 09/26/2015  ? Chronic sinusitis 09/26/2015  ? ILD (interstitial lung disease) ([place]) 07/12/2014  ? Upper airway cough syndrome 09/23/2013  ? Encounter for therapeutic drug monitoring 08/11/2013  ? Congestive heart failure ([place]) 07/08/2013  ? Coronary artery calcification seen on CAT scan 03/15/2013  ? Nodule of left lung 03/15/2013  ? Orthostatic lightheadedness 02/09/2013  ? Depression   ? Diverticulitis 01/01/2012  ? GERD (gastroesophageal reflux disease) 01/01/2012  ? Hypothyroid 11/23/2010  ? Edema 10/04/2010  ? Bronchiectasis ([place])   ? HYPERTENSION, BENIGN 10/22/2008  ? A-fib [facility])   ? ? ?ONSET DATE: 10/06/2021 (referral date) ? ?REFERRING DIAG: R53.81 (ICD-10-CM) - Other malaise J84.9 (ICD-10-CM) - Interstitial  pulmonary disease, unspecified  ? ?THERAPY DIAG:  ?Muscle weakness (generalized) ? ?Unsteadiness on feet ? ?SUBJECTIVE:  ? ?SUBJECTIVE STATEMENT: ?Pt reports that her hip is not/has not healed "fast enough" for her.  Pt reports it does not hurt in bed, but when she gets up it is difficult and painful.  Pt states that her daughter provides supervision/assist with bathing at shower level. ?Pt accompanied by: daughter ? ?PERTINENT HISTORY: right hip fx s/p THA and required revision, COPD, A-fib, CHF  ? ? ?PRECAUTIONS: Fall ? ?WEIGHT BEARING RESTRICTIONS No ? ?PAIN:  ?Are you having pain? Yes: NPRS scale: 6/10 ?Pain location: R hip ?Pain description: aggravating, dull, cramping ?Aggravating factors: mobility, especially trying to get out of bed ?Relieving factors: tylenol, rest ? ?FALLS: Has patient fallen in last 6 months? No ? ?  LIVING ENVIRONMENT: ?Lives with: lives alone and daughter will come over in the mornings to provide breakfast and complete light housework tasks and provides supervision for shower. ?Lives in: House/apartment ?Stairs: Yes: External: 2-3 steps; bilateral but cannot reach both ?Has following equipment at home: Gilford Rile - 2 wheeled, shower chair, Shower bench, bed side commode, Grab bars, and hand held shower head ? ?PLOF: Independent ? ?PATIENT GOALS "to be more stable and be independent" ? ?OBJECTIVE:  ? ?HAND DOMINANCE: Right ? ?ADLs: ?Transfers/ambulation related to ADLs: Mod I for toilet transfers, Supervision for shower transfers ?Grooming: Mod I in standing ?UB Dressing: Mod I  ?LB Dressing: Mod I ?Toileting: Mod I ?Bathing: supervision ?Tub Shower transfers: supervision ?Equipment: Shower seat with back, bed side commode, Reacher, Sock aid, and shoe funnel ? ? ?IADLs: ?Shopping: daughter is completing and uses delivery ?Light housekeeping: daughter helps with some and has a hired housekeeper who comes 1x/week ?Meal Prep: daughter brings some meals, does use microwave for some  meals ?Community mobility: daughter drives pt to appts ?Medication management: Independent ?Financial management: Independent ?Handwriting: 100% legible ? ?MOBILITY STATUS:  Uses RW for mobility, is Mod I in the home

## 2021-10-31 NOTE — Therapy (Signed)
[facility] ?[address], [address] ?[place], [place], [address] ?Phone: [phone]   Fax:  [phone] ? ?Physical Therapy Treatment ? ?Patient Details  ?Name: Stephanie Cortez ?MRN: [id number] ?Date of Birth: Sep 04, 1933 ?Referring Provider (PT): Brand Males, MD ? ? ?Encounter Date: 10/31/2021 ? ? PT End of Session - 10/31/21 1540   ? ? Visit Number 2   ? Number of Visits 16   ? Date for PT Re-Evaluation 12/21/21   ? Authorization Type Medicare A and B/ [employer] Supplemental 2023   ? Progress Note Due on Visit 10   ? PT Start Time 7106   ? PT Stop Time 1620   ? PT Time Calculation (min) 42 min   ? Activity Tolerance Patient tolerated treatment well   ? Behavior During Therapy [facility] for tasks assessed/performed   ? ?  ?  ? ?  ? ? ?Past Medical History:  ?Diagnosis Date  ? Anemia   ? - Hgb 9.7gm% on 07/13/2008 in [place] -  Hgg 129gm% wiht normal irone levsl and ferritin 10/27/2008 in [place] Recurrent otitis/sinusitis  ? Anxiety   ? chronic BZ prn  ? Atrial fibrillation ([place])   ? chronic anticoag  ? Bronchiectasis   ? >PFT 07/13/2008 in [place] 1.9L/76%, FVC 2.45L/74, Ratio 79, TLC 121%, DLCO 64%  AE BRonchiectasis - Dec 2010.New Rx:  outpatient - Feb 2011 - Rx outpatient  ? CHF (congestive heart failure) ([place])   ? COPD (chronic obstructive pulmonary disease) ([place])   ? bronchiectasis  ? Cricopharyngeal achalasia   ? Depression   ? Diverticulosis   ? Dyslipidemia   ? Eczema   ? Fatty liver   ? GERD with stricture   ? Glaucoma   ? H. pylori infection   ? Hypertension   ? Hyponatremia   ? chronic, s/p endo eval 06/2012  ? Hypothyroid   ? IBS (irritable bowel syndrome)   ? Lumbar disc disease   ? Neuropathy of both feet   ? Segmental colitis ([place])   ? Ventral hernia   ? Vertigo   ? Wears glasses   ? ? ?Past Surgical History:  ?Procedure Laterality Date  ? BOWEL RESECTION N/A 02/08/2020  ? Procedure: SMALL BOWEL RESECTION;  Surgeon: Donnie Mesa, MD;  Location: [place];  Service: General;   Laterality: N/A;  ? BREAST SURGERY    ? br bx  ? CARDIAC CATHETERIZATION  07/01/2013  ? CATARACT EXTRACTION    ? both  ? COLONOSCOPY    ? ESOPHAGOSCOPY W/ BOTOX INJECTION  12/11/2011  ? Procedure: ESOPHAGOSCOPY WITH BOTOX INJECTION;  Surgeon: Rozetta Nunnery, MD;  Location: [place];  Service: ENT;  Laterality: N/A;  esophogoscopy with dilation, botox injection  ? FOOT SURGERY  03/14/2011  ? gastroc slide-rt  ? GASTROSTOMY N/A 02/08/2020  ? Procedure: INSERTION OF GASTROSTOMY TUBE;  Surgeon: Donnie Mesa, MD;  Location: [place];  Service: General;  Laterality: N/A;  ? HIP ARTHROPLASTY Right 10/28/2019  ? Procedure: ARTHROPLASTY BIPOLAR HIP (HEMIARTHROPLASTY);  Surgeon: Marybelle Killings, MD;  [facility];  Service: Orthopedics;  Laterality: Right;  ? LAPAROTOMY N/A 02/08/2020  ? Procedure: EXPLORATORY LAPAROTOMY;  Surgeon: Donnie Mesa, MD;  Location: [place];  Service: General;  Laterality: N/A;  ? LYSIS OF ADHESION N/A 02/08/2020  ? Procedure: LYSIS OF ADHESIONS;  Surgeon: Donnie Mesa, MD;  Location: [place];  Service: General;  Laterality: N/A;  ? TOTAL ABDOMINAL HYSTERECTOMY    ?  TOTAL HIP REVISION Right 11/16/2019  ? Procedure: TOTAL HIP REVISION BIPOLAR TO CEMENTED BIPOLAR;  Surgeon: Marybelle Killings, MD;  Location: [place];  Service: Orthopedics;  Laterality: Right;  ? WISDOM TOOTH EXTRACTION    ? ? ?There were no vitals filed for this visit. ? ? Subjective Assessment - 10/31/21 1541   ? ? Subjective No new issues noted. Anticipating coming to PT today   ? Patient is accompained by: Family member   daughter  ? Pertinent History right hip fx s/p THA and required revision, COPD, A-fib, CHF   ? Limitations Lifting;Standing;Walking;House hold activities   ? How long can you sit comfortably? 15-30 min   ? How long can you stand comfortably? varies   ? Patient Stated Goals decrease right hip pain, improve strength/balance/walking   ? Currently in Pain? Yes   ? Pain Score 5    ? Pain Location Hip   ? Pain  Orientation Right;Lateral;Posterior   ? Pain Descriptors / Indicators Sore   ? Pain Type Chronic pain   ? Pain Onset More than a month ago   ? ?  ?  ? ?  ? ? ? ? ? ? ? ? ? ? ? ? ? ? ? ? ? ? ? ? [place] Adult PT Treatment/Exercise - 10/31/21 0001   ? ?  ? Exercises  ? Exercises Knee/Hip   ?  ? Knee/Hip Exercises: Stretches  ? Hip Flexor Stretch Right;3 reps;60 seconds   ? Hip Flexor Stretch Limitations LLE elevated on 6" step, cues for standing tall through RLE RW for BUE support   ?  ? Knee/Hip Exercises: Standing  ? Other Standing Knee Exercises sidestepping at countertop 2x60 sec cues for neutral hip rotation   ?  ? Knee/Hip Exercises: Seated  ? Heel Slides AAROM;Right;3 sets;10 reps   ? Heel Slides Limitations 5 sec stretch for right quad   ? Ball Squeeze 2x10   ? ?  ?  ? ?  ? ? ? ? ? ? ? ? ? ? PT Education - 10/31/21 1633   ? ? Education Details caregiver/child educated on HEP routine via [facility] handout   ? Person(s) Educated Patient;Child(ren)   ? Methods Explanation   ? Comprehension Verbalized understanding   ? ?  ?  ? ?  ? ? ? PT Short Term Goals - 10/26/21 1704   ? ?  ? PT SHORT TERM GOAL #1  ? Title Patient will be independent in HEP to improve functional outcomes   ? Time 4   ? Period Weeks   ? Status New   ? Target Date 11/23/21   ?  ? PT SHORT TERM GOAL #2  ? Title Demonstrate right hip flexion to 90 degrees and right knee flexion to 100 degrees to improve body mechanics for transfers   ? Baseline 70 degrees hip flexion, 92 right knee flexion   ? Time 4   ? Period Weeks   ? Status New   ? Target Date 11/23/21   ?  ? PT SHORT TERM GOAL #3  ? Title Manifest improved BLE strength per time of 30 sec 5xSTS test   ? Baseline 38.56"   ? Time 4   ? Period Weeks   ? Status New   ? Target Date 11/23/21   ? ?  ?  ? ?  ? ? ? ? PT Long Term Goals - 10/26/21 1706   ? ?  ? PT LONG TERM GOAL #  1  ? Title Demo reduced risk for falls per TUG test time 20 sec   ? Baseline 59.03 sec   ? Time 8   ? Period Weeks   ? Status  New   ? Target Date 12/21/21   ?  ? PT LONG TERM GOAL #2  ? Title Manifest improved BLE strength per time 25 sec 5xSTS   ? Time 8   ? Period Weeks   ? Status New   ? Target Date 12/21/21   ? ?  ?  ? ?  ? ? ? ? ? ? ? ? Plan - 10/31/21 1634   ? ? Clinical Impression Statement Very tight and restricted right hip mobility with inability to perform forward trunk flexion over BOS. Tx focus on initiating HEP to address right hip flexor and quad tightness to progress flexibility and joint mobility to enable greater mobility and decreased compensation. Continued sessions indicated to progress training and program for improved RLE ROM, strength, and enable improved dynamic balance and gait tolerance   ? Personal Factors and Comorbidities Age;Comorbidity 3+;Time since onset of injury/illness/exacerbation   ? Comorbidities right LE orthopedics, COPD, cardiac conditions   ? Examination-Activity Limitations Carry;Dressing;Lift;Toileting;Stand;Stairs;Squat;Locomotion Level;Transfers   ? Examination-Participation Restrictions Cleaning;Community Activity;Driving;Yard Work;Occupation;Meal Prep   ? Stability/Clinical Decision Making Evolving/Moderate complexity   ? Rehab Potential Fair   ? PT Frequency 2x / week   ? PT Duration 8 weeks   ? PT Treatment/Interventions ADLs/Self Care Home Management;Aquatic Therapy;Electrical Stimulation;DME Instruction;Gait training;Stair training;Functional mobility training;Therapeutic activities;Therapeutic exercise;Balance training;Neuromuscular re-education;Manual techniques;Passive range of motion;Dry needling;Energy conservation;Joint Manipulations;Vestibular;Taping;Patient/family education   ? PT Next Visit Plan AAROM, PROM in supine   ? PT Home Exercise Plan [id number] seated knee flexion AAROM, hip add squeeze, standing with LLE elevated on 6" step, sidestepping   ? Consulted and Agree with Plan of Care Patient;Family member/caregiver   ? Family Member Consulted daughter   ? ?  ?  ? ?   ? ? ?Patient will benefit from skilled therapeutic intervention in order to improve the following deficits and impairments:  Abnormal gait, Decreased activity tolerance, Decreased balance, Decreased mobility, Dec

## 2021-11-01 DIAGNOSIS — Z20822 Contact with and (suspected) exposure to covid-19: Secondary | ICD-10-CM | POA: Diagnosis not present

## 2021-11-02 ENCOUNTER — Ambulatory Visit: Payer: Medicare Other

## 2021-11-02 DIAGNOSIS — R2689 Other abnormalities of gait and mobility: Secondary | ICD-10-CM

## 2021-11-02 DIAGNOSIS — M6281 Muscle weakness (generalized): Secondary | ICD-10-CM

## 2021-11-02 DIAGNOSIS — R262 Difficulty in walking, not elsewhere classified: Secondary | ICD-10-CM | POA: Diagnosis not present

## 2021-11-02 DIAGNOSIS — R2681 Unsteadiness on feet: Secondary | ICD-10-CM

## 2021-11-02 NOTE — Therapy (Signed)
[facility] [place] [facility] ?[address], [address] ?[place], [place], [address] ?Phone: [phone]   Fax:  [phone] ? ?Physical Therapy Treatment ? ?Patient Details  ?Name: Stephanie Cortez ?MRN: [id number] ?Date of Birth: 1934-03-02 ?Referring Provider (PT): Brand Males, MD ? ? ?Encounter Date: 11/02/2021 ? ? PT End of Session - 11/02/21 1441   ? ? Visit Number 3   ? Number of Visits 16   ? Date for PT Re-Evaluation 12/21/21   ? Authorization Type Medicare A and B/ [employer] Supplemental 2023   ? Progress Note Due on Visit 10   ? PT Start Time 5176   ? PT Stop Time 1530   ? PT Time Calculation (min) 45 min   ? Activity Tolerance Patient tolerated treatment well   ? Behavior During Therapy [facility] for tasks assessed/performed   ? ?  ?  ? ?  ? ? ?Past Medical History:  ?Diagnosis Date  ? Anemia   ? - Hgb 9.7gm% on 07/13/2008 in [place] -  Hgg 129gm% wiht normal irone levsl and ferritin 10/27/2008 in [place] Recurrent otitis/sinusitis  ? Anxiety   ? chronic BZ prn  ? Atrial fibrillation ([place])   ? chronic anticoag  ? Bronchiectasis   ? >PFT 07/13/2008 in [place] 1.9L/76%, FVC 2.45L/74, Ratio 79, TLC 121%, DLCO 64%  AE BRonchiectasis - Dec 2010.New Rx:  outpatient - Feb 2011 - Rx outpatient  ? CHF (congestive heart failure) ([place])   ? COPD (chronic obstructive pulmonary disease) ([place])   ? bronchiectasis  ? Cricopharyngeal achalasia   ? Depression   ? Diverticulosis   ? Dyslipidemia   ? Eczema   ? Fatty liver   ? GERD with stricture   ? Glaucoma   ? H. pylori infection   ? Hypertension   ? Hyponatremia   ? chronic, s/p endo eval 06/2012  ? Hypothyroid   ? IBS (irritable bowel syndrome)   ? Lumbar disc disease   ? Neuropathy of both feet   ? Segmental colitis ([place])   ? Ventral hernia   ? Vertigo   ? Wears glasses   ? ? ?Past Surgical History:  ?Procedure Laterality Date  ? BOWEL RESECTION N/A 02/08/2020  ? Procedure: SMALL BOWEL RESECTION;  Surgeon: Donnie Mesa, MD;  Location: [facility];  Service: General;   Laterality: N/A;  ? BREAST SURGERY    ? br bx  ? CARDIAC CATHETERIZATION  07/01/2013  ? CATARACT EXTRACTION    ? both  ? COLONOSCOPY    ? ESOPHAGOSCOPY W/ BOTOX INJECTION  12/11/2011  ? Procedure: ESOPHAGOSCOPY WITH BOTOX INJECTION;  Surgeon: Rozetta Nunnery, MD;  Location: [facility];  Service: ENT;  Laterality: N/A;  esophogoscopy with dilation, botox injection  ? FOOT SURGERY  03/14/2011  ? gastroc slide-rt  ? GASTROSTOMY N/A 02/08/2020  ? Procedure: INSERTION OF GASTROSTOMY TUBE;  Surgeon: Donnie Mesa, MD;  Location: [facility];  Service: General;  Laterality: N/A;  ? HIP ARTHROPLASTY Right 10/28/2019  ? Procedure: ARTHROPLASTY BIPOLAR HIP (HEMIARTHROPLASTY);  Surgeon: Marybelle Killings, MD;  Location[facility];  Service: Orthopedics;  Laterality: Right;  ? LAPAROTOMY N/A 02/08/2020  ? Procedure: EXPLORATORY LAPAROTOMY;  Surgeon: Donnie Mesa, MD;  Location: [place];  Service: General;  Laterality: N/A;  ? LYSIS OF ADHESION N/A 02/08/2020  ? Procedure: LYSIS OF ADHESIONS;  Surgeon: Donnie Mesa, MD;  Location: [place];  Service: General;  Laterality: N/A;  ? TOTAL ABDOMINAL HYSTERECTOMY    ?  TOTAL HIP REVISION Right 11/16/2019  ? Procedure: TOTAL HIP REVISION BIPOLAR TO CEMENTED BIPOLAR;  Surgeon: Marybelle Killings, MD;  Location: [facility];  Service: Orthopedics;  Laterality: Right;  ? WISDOM TOOTH EXTRACTION    ? ? ?There were no vitals filed for this visit. ? ? Subjective Assessment - 11/02/21 1443   ? ? Subjective No problem with the HEP thus far. Completed one time   ? Patient is accompained by: Family member   daughter  ? Pertinent History right hip fx s/p THA and required revision, COPD, A-fib, CHF   ? Limitations Lifting;Standing;Walking;House hold activities   ? How long can you sit comfortably? 15-30 min   ? How long can you stand comfortably? varies   ? Patient Stated Goals decrease right hip pain, improve strength/balance/walking   ? Currently in Pain? Yes   ? Pain Score 5    ? Pain Location Hip   ? Pain  Orientation Right;Posterior;Lateral   ? Pain Descriptors / Indicators Sore   ? Pain Type Chronic pain   ? Pain Onset More than a month ago   ? ?  ?  ? ?  ? ? ? ? ? ? ? ? ? ? ? ? ? ? ? ? ? ? ? ? [place] Adult PT Treatment/Exercise - 11/02/21 0001   ? ?  ? Knee/Hip Exercises: Stretches  ? Hip Flexor Stretch Right;3 reps;30 seconds   ? Hip Flexor Stretch Limitations LLE elevated on 6" step, cues for standing tall through RLE RW for BUE support   ?  ? Knee/Hip Exercises: Standing  ? Other Standing Knee Exercises sidestepping at countertop 2x60 sec cues for neutral hip rotation   ?  ? Knee/Hip Exercises: Seated  ? Heel Slides AAROM;Right;3 sets;10 reps   ? Heel Slides Limitations 5 sec stretch for right quad, achieves 90-95 degrees flexion   ?  ? Knee/Hip Exercises: Supine  ? Heel Slides AAROM;AROM;3 sets;10 reps   ? Heel Slides Limitations with physio ball, handheld assist, and lastly crossed drawsheet   ? Other Supine Knee/Hip Exercises knee "fall-out" 2x10 (hip IR/ER)   ? ?  ?  ? ?  ? ? ? ? ? ? Balance Exercises - 11/02/21 0001   ? ?  ? Balance Exercises: Standing  ? Standing Eyes Opened Narrow base of support (BOS);Wide (BOA);Foam/compliant surface;2 reps;30 secs   ? Standing Eyes Closed Narrow base of support (BOS);Foam/compliant surface;2 reps;30 secs   ? ?  ?  ? ?  ? ? ? ? ? ? ? PT Short Term Goals - 10/26/21 1704   ? ?  ? PT SHORT TERM GOAL #1  ? Title Patient will be independent in HEP to improve functional outcomes   ? Time 4   ? Period Weeks   ? Status New   ? Target Date 11/23/21   ?  ? PT SHORT TERM GOAL #2  ? Title Demonstrate right hip flexion to 90 degrees and right knee flexion to 100 degrees to improve body mechanics for transfers   ? Baseline 70 degrees hip flexion, 92 right knee flexion   ? Time 4   ? Period Weeks   ? Status New   ? Target Date 11/23/21   ?  ? PT SHORT TERM GOAL #3  ? Title Manifest improved BLE strength per time of 30 sec 5xSTS test   ? Baseline 38.56"   ? Time 4   ? Period Weeks   ?  Status  New   ? Target Date 11/23/21   ? ?  ?  ? ?  ? ? ? ? PT Long Term Goals - 10/26/21 1706   ? ?  ? PT LONG TERM GOAL #1  ? Title Demo reduced risk for falls per TUG test time 20 sec   ? Baseline 59.03 sec   ? Time 8   ? Period Weeks   ? Status New   ? Target Date 12/21/21   ?  ? PT LONG TERM GOAL #2  ? Title Manifest improved BLE strength per time 25 sec 5xSTS   ? Time 8   ? Period Weeks   ? Status New   ? Target Date 12/21/21   ? ?  ?  ? ?  ? ? ? ? ? ? ? ? Plan - 11/02/21 1539   ? ? Clinical Impression Statement Progressing activities for knee flexion RLE without adverse effects. Very limited right hip flexion and internal rotation with difficulty in trunk flexino over BOS for ADL and sit to stand. Continued sessions to progress ROM, strength, and balance. Frequent retro-LOB with standing on compliant surfaces   ? Personal Factors and Comorbidities Age;Comorbidity 3+;Time since onset of injury/illness/exacerbation   ? Comorbidities right LE orthopedics, COPD, cardiac conditions   ? Examination-Activity Limitations Carry;Dressing;Lift;Toileting;Stand;Stairs;Squat;Locomotion Level;Transfers   ? Examination-Participation Restrictions Cleaning;Community Activity;Driving;Yard Work;Occupation;Meal Prep   ? Stability/Clinical Decision Making Evolving/Moderate complexity   ? Rehab Potential Fair   ? PT Frequency 2x / week   ? PT Duration 8 weeks   ? PT Treatment/Interventions ADLs/Self Care Home Management;Aquatic Therapy;Electrical Stimulation;DME Instruction;Gait training;Stair training;Functional mobility training;Therapeutic activities;Therapeutic exercise;Balance training;Neuromuscular re-education;Manual techniques;Passive range of motion;Dry needling;Energy conservation;Joint Manipulations;Vestibular;Taping;Patient/family education   ? PT Next Visit Plan AAROM, PROM in supine   ? PT Home Exercise Plan [id number] seated knee flexion AAROM, hip add squeeze, standing with LLE elevated on 6" step, sidestepping   ?  Consulted and Agree with Plan of Care Patient;Family member/caregiver   ? Family Member Consulted daughter   ? ?  ?  ? ?  ? ? ?Patient will benefit from skilled therapeutic intervention in order to improve

## 2021-11-06 ENCOUNTER — Ambulatory Visit: Payer: Medicare Other | Admitting: Occupational Therapy

## 2021-11-06 ENCOUNTER — Ambulatory Visit: Payer: Medicare Other

## 2021-11-06 DIAGNOSIS — R262 Difficulty in walking, not elsewhere classified: Secondary | ICD-10-CM | POA: Diagnosis not present

## 2021-11-06 DIAGNOSIS — M6281 Muscle weakness (generalized): Secondary | ICD-10-CM

## 2021-11-06 DIAGNOSIS — R2689 Other abnormalities of gait and mobility: Secondary | ICD-10-CM | POA: Diagnosis not present

## 2021-11-06 DIAGNOSIS — R2681 Unsteadiness on feet: Secondary | ICD-10-CM | POA: Diagnosis not present

## 2021-11-06 NOTE — Therapy (Signed)
?OUTPATIENT OCCUPATIONAL THERAPY TREATMENT NOTE ? ? ?Patient Name: Stephanie Cortez ?MRN: [id number] ?DOB:08-01-1933, 88 y.o., female ?Today's Date: 11/06/2021 ? ?PCP: [name], MD  ?REFERRING PROVIDER: Brand Males, MD  ? ?END OF SESSION:  ? OT End of Session - 11/06/21 1448   ? ? Visit Number 2   ? Number of Visits 13   ? Date for OT Re-Evaluation 12/15/21   ? Authorization Type Medicare A & B   ? Authorization - Visit Number 2   ? Progress Note Due on Visit 10   ? OT Start Time 1406   ? OT Stop Time 0034   ? OT Time Calculation (min) 41 min   ? Activity Tolerance Patient tolerated treatment well   ? Behavior During Therapy [facility] for tasks assessed/performed   ? ?  ?  ? ?  ? ? ?Past Medical History:  ?Diagnosis Date  ? Anemia   ? - Hgb 9.7gm% on 07/13/2008 in [place] -  Hgg 129gm% wiht normal irone levsl and ferritin 10/27/2008 in [place] Recurrent otitis/sinusitis  ? Anxiety   ? chronic BZ prn  ? Atrial fibrillation ([place])   ? chronic anticoag  ? Bronchiectasis   ? >PFT 07/13/2008 in [place] 1.9L/76%, FVC 2.45L/74, Ratio 79, TLC 121%, DLCO 64%  AE BRonchiectasis - Dec 2010.New Rx:  outpatient - Feb 2011 - Rx outpatient  ? CHF (congestive heart failure) ([place])   ? COPD (chronic obstructive pulmonary disease) ([place])   ? bronchiectasis  ? Cricopharyngeal achalasia   ? Depression   ? Diverticulosis   ? Dyslipidemia   ? Eczema   ? Fatty liver   ? GERD with stricture   ? Glaucoma   ? H. pylori infection   ? Hypertension   ? Hyponatremia   ? chronic, s/p endo eval 06/2012  ? Hypothyroid   ? IBS (irritable bowel syndrome)   ? Lumbar disc disease   ? Neuropathy of both feet   ? Segmental colitis ([place])   ? Ventral hernia   ? Vertigo   ? Wears glasses   ? ?Past Surgical History:  ?Procedure Laterality Date  ? BOWEL RESECTION N/A 02/08/2020  ? Procedure: SMALL BOWEL RESECTION;  Surgeon: Donnie Mesa, MD;  Location: [place];  Service: General;  Laterality: N/A;  ? BREAST SURGERY    ? br bx  ? CARDIAC CATHETERIZATION  07/01/2013   ? CATARACT EXTRACTION    ? both  ? COLONOSCOPY    ? ESOPHAGOSCOPY W/ BOTOX INJECTION  12/11/2011  ? Procedure: ESOPHAGOSCOPY WITH BOTOX INJECTION;  Surgeon: Rozetta Nunnery, MD;  Location: [facility] [place];  Service: ENT;  Laterality: N/A;  esophogoscopy with dilation, botox injection  ? FOOT SURGERY  03/14/2011  ? gastroc slide-rt  ? GASTROSTOMY N/A 02/08/2020  ? Procedure: INSERTION OF GASTROSTOMY TUBE;  Surgeon: Donnie Mesa, MD;  Location: [facility];  Service: General;  Laterality: N/A;  ? HIP ARTHROPLASTY Right 10/28/2019  ? Procedure: ARTHROPLASTY BIPOLAR HIP (HEMIARTHROPLASTY);  Surgeon: Marybelle Killings, MD;  [facility];  Service: Orthopedics;  Laterality: Right;  ? LAPAROTOMY N/A 02/08/2020  ? Procedure: EXPLORATORY LAPAROTOMY;  Surgeon: Donnie Mesa, MD;  Location: [facility];  Service: General;  Laterality: N/A;  ? LYSIS OF ADHESION N/A 02/08/2020  ? Procedure: LYSIS OF ADHESIONS;  Surgeon: Donnie Mesa, MD;  Location: [facility];  Service: General;  Laterality: N/A;  ? TOTAL ABDOMINAL HYSTERECTOMY    ? TOTAL HIP REVISION Right 11/16/2019  ? Procedure:  TOTAL HIP REVISION BIPOLAR TO CEMENTED BIPOLAR;  Surgeon: Marybelle Killings, MD;  Location: [place];  Service: Orthopedics;  Laterality: Right;  ? WISDOM TOOTH EXTRACTION    ? ?Patient Active Problem List  ? Diagnosis Date Noted  ? Palliative care encounter 07/27/2021  ? Constipation 07/27/2021  ? Cricopharyngeal achalasia 06/24/2021  ? Disorder of cervical esophageal region 06/23/2021  ? Pressure injury of skin 06/21/2021  ? Physical deconditioning 06/21/2021  ? SOB (shortness of breath) 06/18/2021  ? Acute on chronic respiratory failure with hypoxia ([place]) 06/18/2021  ? Chronic diastolic CHF (congestive heart failure) ([place]) 06/18/2021  ? Chronic anemia 06/18/2021  ? COVID-19 virus infection 06/17/2021  ? Chronic pain of right knee 06/20/2020  ? Abnormality of gait 06/20/2020  ? Malnutrition of moderate degree 02/25/2020  ? S/P percutaneous endoscopic gastrostomy  (PEG) tube placement ([place])   ? Post-operative pain   ? Subtherapeutic international normalized ratio (INR)   ? Debility 02/17/2020  ? Mesenteric ischemia ([place])   ? Sepsis ([place]) 02/08/2020  ? Small bowel ischemia ([place]) 02/08/2020  ? Thrombocytosis 02/08/2020  ? Abnormal CT of the abdomen   ? Periprosthetic fracture around internal prosthetic hip joint 11/16/2019  ? Periprosthetic fracture around internal prosthetic hip joint, initial encounter   ? Chronic obstructive pulmonary disease (HCC)   ? Essential hypertension   ? Chronic anticoagulation   ? Acute blood loss anemia   ? Transaminitis   ? Hypoalbuminemia due to protein-calorie malnutrition ([place])   ? Labile blood pressure   ? Drug induced constipation   ? Postoperative pain   ? Subcapital fracture of femur ([place]) 10/30/2019  ? Post-operative state   ? Prerenal azotemia   ? Leukocytosis   ? Closed right hip fracture, initial encounter ([place]) 10/27/2019  ? Hoarseness 02/19/2018  ? Sensorineural hearing loss (SNHL) of both ears 02/19/2018  ? Tinnitus, bilateral 02/19/2018  ? Cough 09/06/2017  ? Cough variant asthma 08/19/2017  ? Acute bronchitis 02/20/2016  ? Pulmonary air trapping 01/04/2016  ? Encounter for preoperative pulmonary examination 01/04/2016  ? NSIP (nonspecific interstitial pneumonia) ([place]) 09/26/2015  ? Chronic sinusitis 09/26/2015  ? ILD (interstitial lung disease) ([place]) 07/12/2014  ? Upper airway cough syndrome 09/23/2013  ? Encounter for therapeutic drug monitoring 08/11/2013  ? Congestive heart failure ([place]) 07/08/2013  ? Coronary artery calcification seen on CAT scan 03/15/2013  ? Nodule of left lung 03/15/2013  ? Orthostatic lightheadedness 02/09/2013  ? Depression   ? Diverticulitis 01/01/2012  ? GERD (gastroesophageal reflux disease) 01/01/2012  ? Hypothyroid 11/23/2010  ? Edema 10/04/2010  ? Bronchiectasis ([place])   ? HYPERTENSION, BENIGN 10/22/2008  ? A-fib [facility])   ? ? ?ONSET DATE: 10/06/2021 (referral date) ?  ?REFERRING DIAG: R53.81  (ICD-10-CM) - Other malaise J84.9 (ICD-10-CM) - Interstitial pulmonary disease, unspecified  ? ?THERAPY DIAG:  ?Muscle weakness (generalized) ? ?Unsteadiness on feet ? ? ?PERTINENT HISTORY: right hip fx s/p THA and required revision, COPD, A-fib, CHF  ? ?PRECAUTIONS: Fall ? ?SUBJECTIVE: "When will this hip be completely better?" ? ?PAIN:  ?Are you having pain? Yes: NPRS scale: 7/10 ?Pain location: R hip ?Pain description: aggravating, dull, cramping ?Aggravating factors: mobility, especially trying to get out of bed ?Relieving factors: tylenol, rest ? ? ? ? ?OBJECTIVE:  ? ?TODAY'S TREATMENT: ?Transitional movements: engaged in blocked practice of sit <> stand with focus on anterior weight shift as possible and education on safe hand placement to compensate for decreased anterior weight shift.  Pt requires min guard and  stabilization at [facility] when standing from surface without UE support. ? ?Therapeutic activity: Engaged in weight shifting to R in standing to increase acceptance of weight through RLE to increase participation and safety with ADLs and IADLs.  Engaged in table top task with focus on weight shifting and reaching to R to further facilitate weight shifting.  Therapist providing tactile cues at L hip to facilitate weight shift through RLE.  Increased challenge by moving target to further facilitate increased weight shifting. ? ?PATIENT EDUCATION: ?Education details: Educated on role and purpose of OT as well as potential interventions and goals for therapy based on initial evaluation findings. ?Person educated: Patient ?Education method: Explanation ?Education comprehension: verbalized understanding ?  ?  ?HOME EXERCISE PROGRAM: ?TBD ?  ?  ?  ?GOALS: ?Goals reviewed with patient? No ?  ?SHORT TERM GOALS: Target date: 11/24/2021 ?  ?Pt will be independent in HEP to address BUE strengthening and conditioning to increase independence with ADLs and IADLs ?Baseline: ?Goal status: on-going ?  ?2.  Pt will verbalize  energy conservation strategies and provide 2 examples that she is incorporating to increase her safety/independence with ADLs and IADLs. ?Baseline:  ?Goal status: on-going ?  ?  ?LONG TERM GOALS: Target date:

## 2021-11-06 NOTE — Therapy (Signed)
[place] ?[address], [address] ?[place], [place], [address] ?Phone: [phone]   Fax:  [phone] ? ?Physical Therapy Treatment ? ?Patient Details  ?Name: Stephanie Cortez ?MRN: [id number] ?Date of Birth: 22-Jun-1934 ?Referring Provider (PT): Brand Males, MD ? ? ?Encounter Date: 11/06/2021 ? ? PT End of Session - 11/06/21 1458   ? ? Visit Number 4   ? Number of Visits 16   ? Date for PT Re-Evaluation 12/21/21   ? Authorization Type Medicare A and B/ U[employer] Supplemental 2023   ? Progress Note Due on Visit 10   ? PT Start Time 4193   ? PT Stop Time 1530   ? PT Time Calculation (min) 45 min   ? Activity Tolerance Patient tolerated treatment well   ? Behavior During Therapy [facility] for tasks assessed/performed   ? ?  ?  ? ?  ? ? ?Past Medical History:  ?Diagnosis Date  ? Anemia   ? - Hgb 9.7gm% on 07/13/2008 in [place] -  Hgg 129gm% wiht normal irone levsl and ferritin 10/27/2008 in [place] Recurrent otitis/sinusitis  ? Anxiety   ? chronic BZ prn  ? Atrial fibrillation ([place])   ? chronic anticoag  ? Bronchiectasis   ? >PFT 07/13/2008 in [place] 1.9L/76%, FVC 2.45L/74, Ratio 79, TLC 121%, DLCO 64%  AE BRonchiectasis - Dec 2010.New Rx:  outpatient - Feb 2011 - Rx outpatient  ? CHF (congestive heart failure) ([place])   ? COPD (chronic obstructive pulmonary disease) ([place])   ? bronchiectasis  ? Cricopharyngeal achalasia   ? Depression   ? Diverticulosis   ? Dyslipidemia   ? Eczema   ? Fatty liver   ? GERD with stricture   ? Glaucoma   ? H. pylori infection   ? Hypertension   ? Hyponatremia   ? chronic, s/p endo eval 06/2012  ? Hypothyroid   ? IBS (irritable bowel syndrome)   ? Lumbar disc disease   ? Neuropathy of both feet   ? Segmental colitis ([place])   ? Ventral hernia   ? Vertigo   ? Wears glasses   ? ? ?Past Surgical History:  ?Procedure Laterality Date  ? BOWEL RESECTION N/A 02/08/2020  ? Procedure: SMALL BOWEL RESECTION;  Surgeon: Donnie Mesa, MD;  Location: [place];  Service: General;   Laterality: N/A;  ? BREAST SURGERY    ? br bx  ? CARDIAC CATHETERIZATION  07/01/2013  ? CATARACT EXTRACTION    ? both  ? COLONOSCOPY    ? ESOPHAGOSCOPY W/ BOTOX INJECTION  12/11/2011  ? Procedure: ESOPHAGOSCOPY WITH BOTOX INJECTION;  Surgeon: Rozetta Nunnery, MD;  Location: [place];  Service: ENT;  Laterality: N/A;  esophogoscopy with dilation, botox injection  ? FOOT SURGERY  03/14/2011  ? gastroc slide-rt  ? GASTROSTOMY N/A 02/08/2020  ? Procedure: INSERTION OF GASTROSTOMY TUBE;  Surgeon: Donnie Mesa, MD;  Location: [place];  Service: General;  Laterality: N/A;  ? HIP ARTHROPLASTY Right 10/28/2019  ? Procedure: ARTHROPLASTY BIPOLAR HIP (HEMIARTHROPLASTY);  Surgeon: Marybelle Killings, MD;  Location: [facility];  Service: Orthopedics;  Laterality: Right;  ? LAPAROTOMY N/A 02/08/2020  ? Procedure: EXPLORATORY LAPAROTOMY;  Surgeon: Donnie Mesa, MD;  Location: [place];  Service: General;  Laterality: N/A;  ? LYSIS OF ADHESION N/A 02/08/2020  ? Procedure: LYSIS OF ADHESIONS;  Surgeon: Donnie Mesa, MD;  Location: [place];  Service: General;  Laterality: N/A;  ? TOTAL ABDOMINAL HYSTERECTOMY    ?  TOTAL HIP REVISION Right 11/16/2019  ? Procedure: TOTAL HIP REVISION BIPOLAR TO CEMENTED BIPOLAR;  Surgeon: Marybelle Killings, MD;  Location: [place];  Service: Orthopedics;  Laterality: Right;  ? WISDOM TOOTH EXTRACTION    ? ? ?There were no vitals filed for this visit. ? ? Subjective Assessment - 11/06/21 1510   ? ? Subjective Trouble remembering what to do at home. Therapist reinforced use of written/visual HEP instructions via [place]   ? Patient is accompained by: Family member   daughter  ? Pertinent History right hip fx s/p THA and required revision, COPD, A-fib, CHF   ? Limitations Lifting;Standing;Walking;House hold activities   ? How long can you sit comfortably? 15-30 min   ? How long can you stand comfortably? varies   ? Patient Stated Goals decrease right hip pain, improve strength/balance/walking   ?  Currently in Pain? Yes   ? Pain Score 6    ? Pain Location Hip   ? Pain Orientation Right;Lateral   ? Pain Descriptors / Indicators Sore   ? Pain Type Chronic pain   ? Pain Onset More than a month ago   ? ?  ?  ? ?  ? ? ? ? ? [facility] PT Assessment - 11/06/21 0001   ? ?  ? Assessment  ? Medical Diagnosis ILD/Deconditioning   ? ?  ?  ? ?  ? ? ? ? ? ? ? ? ? ? ? ? ? ? ? ? [place] Adult PT Treatment/Exercise - 11/06/21 0001   ? ?  ? Knee/Hip Exercises: Standing  ? Other Standing Knee Exercises see "balance exercises" tab   ?  ? Knee/Hip Exercises: Seated  ? Heel Slides AAROM;Right;3 sets;10 reps   ? Heel Slides Limitations 5 sec stretch for right quad, achieves 90-95 degrees flexion   ? Ball Squeeze 3x10   ? Sit to Sand 1 set;5 reps   26" seat height  ?  ? Knee/Hip Exercises: Supine  ? Heel Slides AAROM;Right;3 sets;10 reps   ? Heel Slides Limitations with crossed draw sheet   ?  ? Knee/Hip Exercises: Sidelying  ? Clams 2x10 with tactile cues for position   ? ?  ?  ? ?  ? ? ? ? ? ? Balance Exercises - 11/06/21 0001   ? ?  ? Balance Exercises: Standing  ? SLS Eyes open;Upper extremity support 2;2 reps;30 secs   use of parallel bars with task being brief time of single UE support  ? Standing, One Foot on a Step Eyes open;Other (comment)   3x60 sec LLE only elevated to WB on RLE 10" step height  ? Sidestepping 2 reps   60 sec  ? ?  ?  ? ?  ? ? ? ? ? ? ? PT Short Term Goals - 10/26/21 1704   ? ?  ? PT SHORT TERM GOAL #1  ? Title Patient will be independent in HEP to improve functional outcomes   ? Time 4   ? Period Weeks   ? Status New   ? Target Date 11/23/21   ?  ? PT SHORT TERM GOAL #2  ? Title Demonstrate right hip flexion to 90 degrees and right knee flexion to 100 degrees to improve body mechanics for transfers   ? Baseline 70 degrees hip flexion, 92 right knee flexion   ? Time 4   ? Period Weeks   ? Status New   ? Target Date 11/23/21   ?  ?  PT SHORT TERM GOAL #3  ? Title Manifest improved BLE strength per time of 30 sec  5xSTS test   ? Baseline 38.56"   ? Time 4   ? Period Weeks   ? Status New   ? Target Date 11/23/21   ? ?  ?  ? ?  ? ? ? ? PT Long Term Goals - 10/26/21 1706   ? ?  ? PT LONG TERM GOAL #1  ? Title Demo reduced risk for falls per TUG test time 20 sec   ? Baseline 59.03 sec   ? Time 8   ? Period Weeks   ? Status New   ? Target Date 12/21/21   ?  ? PT LONG TERM GOAL #2  ? Title Manifest improved BLE strength per time 25 sec 5xSTS   ? Time 8   ? Period Weeks   ? Status New   ? Target Date 12/21/21   ? ?  ?  ? ?  ? ? ? ? ? ? ? ? Plan - 11/06/21 1543   ? ? Clinical Impression Statement Poor HEP recall with therapist reinforcing use of written/visual materials for reference. Able to perform sidelying clam shells for RLE without increase in pain with tactile cues to improve isolated motion. Requires BUE support when attempting single limb support RLE due to hip weakness and requires compensated sit to stand transfers due to limited right hip flexion ROM and strength. Palpation to right lateral hip reveals soft tissue deformities/scar tissue and tender to palpation at greater trochanter. Continued sessions to progress LE ROM and strength as able to improve single limb support and improve balance to reduce risk for falls   ? Personal Factors and Comorbidities Age;Comorbidity 3+;Time since onset of injury/illness/exacerbation   ? Comorbidities right LE orthopedics, COPD, cardiac conditions   ? Examination-Activity Limitations Carry;Dressing;Lift;Toileting;Stand;Stairs;Squat;Locomotion Level;Transfers   ? Examination-Participation Restrictions Cleaning;Community Activity;Driving;Yard Work;Occupation;Meal Prep   ? Stability/Clinical Decision Making Evolving/Moderate complexity   ? Rehab Potential Fair   ? PT Frequency 2x / week   ? PT Duration 8 weeks   ? PT Treatment/Interventions ADLs/Self Care Home Management;Aquatic Therapy;Electrical Stimulation;DME Instruction;Gait training;Stair training;Functional mobility  training;Therapeutic activities;Therapeutic exercise;Balance training;Neuromuscular re-education;Manual techniques;Passive range of motion;Dry needling;Energy conservation;Joint Manipulations;Vestibular;Taping;Patient/

## 2021-11-08 ENCOUNTER — Encounter: Payer: Self-pay | Admitting: Occupational Therapy

## 2021-11-08 ENCOUNTER — Ambulatory Visit: Payer: Medicare Other

## 2021-11-08 ENCOUNTER — Ambulatory Visit: Payer: Medicare Other | Admitting: Occupational Therapy

## 2021-11-13 ENCOUNTER — Ambulatory Visit: Payer: Medicare Other | Admitting: Occupational Therapy

## 2021-11-13 ENCOUNTER — Ambulatory Visit: Payer: Medicare Other

## 2021-11-13 DIAGNOSIS — R262 Difficulty in walking, not elsewhere classified: Secondary | ICD-10-CM

## 2021-11-13 DIAGNOSIS — R2689 Other abnormalities of gait and mobility: Secondary | ICD-10-CM | POA: Diagnosis not present

## 2021-11-13 DIAGNOSIS — R2681 Unsteadiness on feet: Secondary | ICD-10-CM

## 2021-11-13 DIAGNOSIS — M6281 Muscle weakness (generalized): Secondary | ICD-10-CM

## 2021-11-13 NOTE — Therapy (Signed)
[place] [facility] ?[address], [address] ?[place], [place], [address] ?Phone: [phone]   Fax:  [phone] ? ?Physical Therapy Treatment ? ?Patient Details  ?Name: Stephanie Cortez ?MRN: [id number] ?Date of Birth: September 15, 1933 ?Referring Provider (PT): Brand Males, MD ? ? ?Encounter Date: 11/13/2021 ? ? PT End of Session - 11/13/21 1409   ? ? Visit Number 5   ? Number of Visits 16   ? Date for PT Re-Evaluation 12/21/21   ? Authorization Type Medicare A and B/ UHC Supplemental 2023   ? Progress Note Due on Visit 10   ? PT Start Time 1405   ? PT Stop Time 7782   ? PT Time Calculation (min) 40 min   ? Activity Tolerance Patient tolerated treatment well   ? Behavior During Therapy [facility] for tasks assessed/performed   ? ?  ?  ? ?  ? ? ?Past Medical History:  ?Diagnosis Date  ? Anemia   ? - Hgb 9.7gm% on 07/13/2008 in [place] -  Hgg 129gm% wiht normal irone levsl and ferritin 10/27/2008 in [place] Recurrent otitis/sinusitis  ? Anxiety   ? chronic BZ prn  ? Atrial fibrillation ([place])   ? chronic anticoag  ? Bronchiectasis   ? >PFT 07/13/2008 in [place] 1.9L/76%, FVC 2.45L/74, Ratio 79, TLC 121%, DLCO 64%  AE BRonchiectasis - Dec 2010.New Rx:  outpatient - Feb 2011 - Rx outpatient  ? CHF (congestive heart failure) ([place])   ? COPD (chronic obstructive pulmonary disease) ([place])   ? bronchiectasis  ? Cricopharyngeal achalasia   ? Depression   ? Diverticulosis   ? Dyslipidemia   ? Eczema   ? Fatty liver   ? GERD with stricture   ? Glaucoma   ? H. pylori infection   ? Hypertension   ? Hyponatremia   ? chronic, s/p endo eval 06/2012  ? Hypothyroid   ? IBS (irritable bowel syndrome)   ? Lumbar disc disease   ? Neuropathy of both feet   ? Segmental colitis ([place])   ? Ventral hernia   ? Vertigo   ? Wears glasses   ? ? ?Past Surgical History:  ?Procedure Laterality Date  ? BOWEL RESECTION N/A 02/08/2020  ? Procedure: SMALL BOWEL RESECTION;  Surgeon: Donnie Mesa, MD;  Location: [place];  Service: General;   Laterality: N/A;  ? BREAST SURGERY    ? br bx  ? CARDIAC CATHETERIZATION  07/01/2013  ? CATARACT EXTRACTION    ? both  ? COLONOSCOPY    ? ESOPHAGOSCOPY W/ BOTOX INJECTION  12/11/2011  ? Procedure: ESOPHAGOSCOPY WITH BOTOX INJECTION;  Surgeon: Rozetta Nunnery, MD;  Location: [place];  Service: ENT;  Laterality: N/A;  esophogoscopy with dilation, botox injection  ? FOOT SURGERY  03/14/2011  ? gastroc slide-rt  ? GASTROSTOMY N/A 02/08/2020  ? Procedure: INSERTION OF GASTROSTOMY TUBE;  Surgeon: Donnie Mesa, MD;  Location: [place];  Service: General;  Laterality: N/A;  ? HIP ARTHROPLASTY Right 10/28/2019  ? Procedure: ARTHROPLASTY BIPOLAR HIP (HEMIARTHROPLASTY);  Surgeon: Marybelle Killings, MD;  [facility];  Service: Orthopedics;  Laterality: Right;  ? LAPAROTOMY N/A 02/08/2020  ? Procedure: EXPLORATORY LAPAROTOMY;  Surgeon: Donnie Mesa, MD;  Location: [place];  Service: General;  Laterality: N/A;  ? LYSIS OF ADHESION N/A 02/08/2020  ? Procedure: LYSIS OF ADHESIONS;  Surgeon: Donnie Mesa, MD;  Location: [place];  Service: General;  Laterality: N/A;  ? TOTAL ABDOMINAL HYSTERECTOMY    ?  TOTAL HIP REVISION Right 11/16/2019  ? Procedure: TOTAL HIP REVISION BIPOLAR TO CEMENTED BIPOLAR;  Surgeon: Marybelle Killings, MD;  Location: [facility];  Service: Orthopedics;  Laterality: Right;  ? WISDOM TOOTH EXTRACTION    ? ? ?There were no vitals filed for this visit. ? ? Subjective Assessment - 11/13/21 1409   ? ? Subjective "Not sure if it's the weather but the right hip and outside of leg are feeling more sore"   ? Patient is accompained by: Family member   daughter  ? Pertinent History right hip fx s/p THA and required revision, COPD, A-fib, CHF   ? Limitations Lifting;Standing;Walking;House hold activities   ? How long can you sit comfortably? 15-30 min   ? How long can you stand comfortably? varies   ? Patient Stated Goals decrease right hip pain, improve strength/balance/walking   ? Currently in Pain? Yes   ? Pain  Score 8    ? Pain Location Hip   ? Pain Orientation Right;Lateral   ? Pain Descriptors / Indicators [place]   ? Pain Type Chronic pain   ? Pain Onset More than a month ago   ? ?  ?  ? ?  ? ? ? ? ? ? ? ? ? ? ? ? ? ? ? ? ? ? ? ? [place] Adult PT Treatment/Exercise - 11/13/21 0001   ? ?  ? Knee/Hip Exercises: Supine  ? Quad Sets Strengthening;Right;3 sets;10 reps   ? Heel Slides AAROM;Right;3 sets;10 reps   ? Heel Slides Limitations with playground ball and HHA   ? Hip Adduction Isometric Strengthening;3 sets;10 reps   ? ?  ?  ? ?  ? ? ? ? ? ? Balance Exercises - 11/13/21 0001   ? ?  ? Balance Exercises: Standing  ? Standing, One Foot on a Step Eyes open;8 inch;2 reps;30 secs   ? Sidestepping Other (comment)   x 2 min along countertop  ? ?  ?  ? ?  ? ? ? ? ? ? ? PT Short Term Goals - 10/26/21 1704   ? ?  ? PT SHORT TERM GOAL #1  ? Title Patient will be independent in HEP to improve functional outcomes   ? Time 4   ? Period Weeks   ? Status New   ? Target Date 11/23/21   ?  ? PT SHORT TERM GOAL #2  ? Title Demonstrate right hip flexion to 90 degrees and right knee flexion to 100 degrees to improve body mechanics for transfers   ? Baseline 70 degrees hip flexion, 92 right knee flexion   ? Time 4   ? Period Weeks   ? Status New   ? Target Date 11/23/21   ?  ? PT SHORT TERM GOAL #3  ? Title Manifest improved BLE strength per time of 30 sec 5xSTS test   ? Baseline 38.56"   ? Time 4   ? Period Weeks   ? Status New   ? Target Date 11/23/21   ? ?  ?  ? ?  ? ? ? ? PT Long Term Goals - 10/26/21 1706   ? ?  ? PT LONG TERM GOAL #1  ? Title Demo reduced risk for falls per TUG test time 20 sec   ? Baseline 59.03 sec   ? Time 8   ? Period Weeks   ? Status New   ? Target Date 12/21/21   ?  ? PT LONG TERM  GOAL #2  ? Title Manifest improved BLE strength per time 25 sec 5xSTS   ? Time 8   ? Period Weeks   ? Status New   ? Target Date 12/21/21   ? ?  ?  ? ?  ? ? ? ? ? ? ? ? Plan - 11/13/21 1457   ? ? Clinical Impression Statement Pt notes  increased right hip/lateral leg pain today which she attributes to changing weather. Activities initiated in supine and with moist heat laid across right hip/buttocks while performing initial supine ther ex to improve mobility/comfort. Tolerated well and able to progress to more large amplitude and dynamic movements for RLE with improved HEP recall today using worksheets for reference. Continues to exhibit very limited and restricted right hip movements flexion being the most limited. Progressing with single limb weight bearing activities via LLE elevated on step. Continued sessions indicated to progress activities to ehance RLE strength, ROM, and progress dynamic balance activities to reduce risk for falls   ? Personal Factors and Comorbidities Age;Comorbidity 3+;Time since onset of injury/illness/exacerbation   ? Comorbidities right LE orthopedics, COPD, cardiac conditions   ? Examination-Activity Limitations Carry;Dressing;Lift;Toileting;Stand;Stairs;Squat;Locomotion Level;Transfers   ? Examination-Participation Restrictions Cleaning;Community Activity;Driving;Yard Work;Occupation;Meal Prep   ? Stability/Clinical Decision Making Evolving/Moderate complexity   ? Rehab Potential Fair   ? PT Frequency 2x / week   ? PT Duration 8 weeks   ? PT Treatment/Interventions ADLs/Self Care Home Management;Aquatic Therapy;Electrical Stimulation;DME Instruction;Gait training;Stair training;Functional mobility training;Therapeutic activities;Therapeutic exercise;Balance training;Neuromuscular re-education;Manual techniques;Passive range of motion;Dry needling;Energy conservation;Joint Manipulations;Vestibular;Taping;Patient/family education   ? PT Next Visit Plan AAROM, PROM in supine   ? PT Home Exercise Plan [id number] seated knee flexion AAROM, hip add squeeze, standing with LLE elevated on 6" step, sidestepping, heel slides with strap   ? Consulted and Agree with Plan of Care Patient;Family member/caregiver   ? Family Member  Consulted daughter   ? ?  ?  ? ?  ? ? ?Patient will benefit from skilled therapeutic intervention in order to improve the following deficits and impairments:  Abnormal gait, Decreased activity tolerance, Decr

## 2021-11-13 NOTE — Therapy (Signed)
?OUTPATIENT OCCUPATIONAL THERAPY TREATMENT NOTE ? ? ?Patient Name: Stephanie Cortez ?MRN: [id number] ?DOB:1933-10-06, 88 y.o., female ?Today's Date: 11/13/2021 ? ?PCP: [name], MD  ?REFERRING PROVIDER: Brand Males, MD  ? ?END OF SESSION:  ? OT End of Session - 11/13/21 1454   ? ? Visit Number 3   ? Number of Visits 13   ? Date for OT Re-Evaluation 12/15/21   ? Authorization Type Medicare A & B   ? Authorization - Visit Number 3   ? Progress Note Due on Visit 10   ? OT Start Time 1452   ? OT Stop Time 1532   ? OT Time Calculation (min) 40 min   ? Activity Tolerance Patient tolerated treatment well   ? Behavior During Therapy [facility] for tasks assessed/performed   ? ?  ?  ? ?  ? ? ?Past Medical History:  ?Diagnosis Date  ? Anemia   ? - Hgb 9.7gm% on 07/13/2008 in [place] -  Hgg 129gm% wiht normal irone levsl and ferritin 10/27/2008 in [place] Recurrent otitis/sinusitis  ? Anxiety   ? chronic BZ prn  ? Atrial fibrillation ([place])   ? chronic anticoag  ? Bronchiectasis   ? >PFT 07/13/2008 in [place] 1.9L/76%, FVC 2.45L/74, Ratio 79, TLC 121%, DLCO 64%  AE BRonchiectasis - Dec 2010.New Rx:  outpatient - Feb 2011 - Rx outpatient  ? CHF (congestive heart failure) ([place])   ? COPD (chronic obstructive pulmonary disease) ([place])   ? bronchiectasis  ? Cricopharyngeal achalasia   ? Depression   ? Diverticulosis   ? Dyslipidemia   ? Eczema   ? Fatty liver   ? GERD with stricture   ? Glaucoma   ? H. pylori infection   ? Hypertension   ? Hyponatremia   ? chronic, s/p endo eval 06/2012  ? Hypothyroid   ? IBS (irritable bowel syndrome)   ? Lumbar disc disease   ? Neuropathy of both feet   ? Segmental colitis ([place])   ? Ventral hernia   ? Vertigo   ? Wears glasses   ? ?Past Surgical History:  ?Procedure Laterality Date  ? BOWEL RESECTION N/A 02/08/2020  ? Procedure: SMALL BOWEL RESECTION;  Surgeon: Donnie Mesa, MD;  Location: [place];  Service: General;  Laterality: N/A;  ? BREAST SURGERY    ? br bx  ? CARDIAC CATHETERIZATION   07/01/2013  ? CATARACT EXTRACTION    ? both  ? COLONOSCOPY    ? ESOPHAGOSCOPY W/ BOTOX INJECTION  12/11/2011  ? Procedure: ESOPHAGOSCOPY WITH BOTOX INJECTION;  Surgeon: Rozetta Nunnery, MD;  Location: [place];  Service: ENT;  Laterality: N/A;  esophogoscopy with dilation, botox injection  ? FOOT SURGERY  03/14/2011  ? gastroc slide-rt  ? GASTROSTOMY N/A 02/08/2020  ? Procedure: INSERTION OF GASTROSTOMY TUBE;  Surgeon: Donnie Mesa, MD;  Location: [place];  Service: General;  Laterality: N/A;  ? HIP ARTHROPLASTY Right 10/28/2019  ? Procedure: ARTHROPLASTY BIPOLAR HIP (HEMIARTHROPLASTY);  Surgeon: Marybelle Killings, MD;  Location: [facility];  Service: Orthopedics;  Laterality: Right;  ? LAPAROTOMY N/A 02/08/2020  ? Procedure: EXPLORATORY LAPAROTOMY;  Surgeon: Donnie Mesa, MD;  Location: [place];  Service: General;  Laterality: N/A;  ? LYSIS OF ADHESION N/A 02/08/2020  ? Procedure: LYSIS OF ADHESIONS;  Surgeon: Donnie Mesa, MD;  Location: [place];  Service: General;  Laterality: N/A;  ? TOTAL ABDOMINAL HYSTERECTOMY    ? TOTAL HIP REVISION Right 11/16/2019  ? Procedure:  TOTAL HIP REVISION BIPOLAR TO CEMENTED BIPOLAR;  Surgeon: Marybelle Killings, MD;  Location: [place];  Service: Orthopedics;  Laterality: Right;  ? WISDOM TOOTH EXTRACTION    ? ?Patient Active Problem List  ? Diagnosis Date Noted  ? Palliative care encounter 07/27/2021  ? Constipation 07/27/2021  ? Cricopharyngeal achalasia 06/24/2021  ? Disorder of cervical esophageal region 06/23/2021  ? Pressure injury of skin 06/21/2021  ? Physical deconditioning 06/21/2021  ? SOB (shortness of breath) 06/18/2021  ? Acute on chronic respiratory failure with hypoxia ([place]) 06/18/2021  ? Chronic diastolic CHF (congestive heart failure) ([place]) 06/18/2021  ? Chronic anemia 06/18/2021  ? COVID-19 virus infection 06/17/2021  ? Chronic pain of right knee 06/20/2020  ? Abnormality of gait 06/20/2020  ? Malnutrition of moderate degree 02/25/2020  ? S/P percutaneous endoscopic  gastrostomy (PEG) tube placement ([place])   ? Post-operative pain   ? Subtherapeutic international normalized ratio (INR)   ? Debility 02/17/2020  ? Mesenteric ischemia ([place])   ? Sepsis ([place]) 02/08/2020  ? Small bowel ischemia ([place]) 02/08/2020  ? Thrombocytosis 02/08/2020  ? Abnormal CT of the abdomen   ? Periprosthetic fracture around internal prosthetic hip joint 11/16/2019  ? Periprosthetic fracture around internal prosthetic hip joint, initial encounter   ? Chronic obstructive pulmonary disease (H[facility])   ? Essential hypertension   ? Chronic anticoagulation   ? Acute blood loss anemia   ? Transaminitis   ? Hypoalbuminemia due to protein-calorie malnutrition ([place])   ? Labile blood pressure   ? Drug induced constipation   ? Postoperative pain   ? Subcapital fracture of femur ([place]) 10/30/2019  ? Post-operative state   ? Prerenal azotemia   ? Leukocytosis   ? Closed right hip fracture, initial encounter ([place]) 10/27/2019  ? Hoarseness 02/19/2018  ? Sensorineural hearing loss (SNHL) of both ears 02/19/2018  ? Tinnitus, bilateral 02/19/2018  ? Cough 09/06/2017  ? Cough variant asthma 08/19/2017  ? Acute bronchitis 02/20/2016  ? Pulmonary air trapping 01/04/2016  ? Encounter for preoperative pulmonary examination 01/04/2016  ? NSIP (nonspecific interstitial pneumonia) ([place]) 09/26/2015  ? Chronic sinusitis 09/26/2015  ? ILD (interstitial lung disease) ([place]) 07/12/2014  ? Upper airway cough syndrome 09/23/2013  ? Encounter for therapeutic drug monitoring 08/11/2013  ? Congestive heart failure ([place]) 07/08/2013  ? Coronary artery calcification seen on CAT scan 03/15/2013  ? Nodule of left lung 03/15/2013  ? Orthostatic lightheadedness 02/09/2013  ? Depression   ? Diverticulitis 01/01/2012  ? GERD (gastroesophageal reflux disease) 01/01/2012  ? Hypothyroid 11/23/2010  ? Edema 10/04/2010  ? Bronchiectasis ([place])   ? HYPERTENSION, BENIGN 10/22/2008  ? A-fib [facility])   ? ? ?ONSET DATE: 10/06/2021 (referral date) ?  ?REFERRING DIAG:  R53.81 (ICD-10-CM) - Other malaise J84.9 (ICD-10-CM) - Interstitial pulmonary disease, unspecified  ? ?THERAPY DIAG:  ?Muscle weakness (generalized) ? ?Unsteadiness on feet ? ? ?PERTINENT HISTORY: right hip fx s/p THA and required revision, COPD, A-fib, CHF  ? ?PRECAUTIONS: Fall ? ?SUBJECTIVE: "I stay so cold" ? ?PAIN:  ?Are you having pain? Yes: NPRS scale: 7/10 ?Pain location: R hip ?Pain description: aggravating, dull, cramping ?Aggravating factors: mobility, especially trying to get out of bed ?Relieving factors: tylenol, rest ? ? ? ? ?OBJECTIVE:  ? ?TODAY'S TREATMENT: ?Transitional movements:  Engaged in sit > stand x5 throughout session with focus on increased anterior weight shift and decreased reliance on RW during sit > stand.  Pt able to complete each attempt with supervision, min cues for improved  technique. ? ?Therapeutic activity:  Therapist educated on modified yoga poses for improved standing balance and weight shifting as needed for functional mobility, self-care tasks, and endurance.  Pt completed 1 set of [place] with feet apart and hands on counter for UE support.  Pt able to demonstrated weight shifting on to toes, however continues to keep RLE rotated out.  Encouraged pt to slowly rotate RLE inward as able.  Completed single leg balance with opposite leg star reach while standing on LLE.  Pt unable to complete standing on RLE this session due to fatigue.  Completed seated single knee to chest with LLE and modified position to hip flexion of RLE to tolerance. ? ?Energy conservation: Provided pt with written handout from OT toolkit with education on planning, pacing prioritizing, and positioning to aid in energy conservation during ADLs and IADLs.  Therapist providing examples of each.  Pt able to identify some areas where she could have some improvements. ? ?PATIENT EDUCATION: ?Education details: Educated on functional mobility and activity tolerance with increasing weight shifting  through RLE in standing activities.   ?Person educated: Patient ?Education method: Explanation ?Education comprehension: verbalized understanding ?  ?  ?HOME EXERCISE PROGRAM: ?Access Code: [email or web address] ?URL: [email or web address]

## 2021-11-14 ENCOUNTER — Ambulatory Visit (INDEPENDENT_AMBULATORY_CARE_PROVIDER_SITE_OTHER): Payer: Medicare Other | Admitting: Internal Medicine

## 2021-11-14 ENCOUNTER — Encounter: Payer: Self-pay | Admitting: Internal Medicine

## 2021-11-14 ENCOUNTER — Ambulatory Visit: Payer: Medicare Other | Admitting: Internal Medicine

## 2021-11-14 VITALS — BP 126/74 | HR 98 | Ht 66.0 in | Wt 153.8 lb

## 2021-11-14 DIAGNOSIS — J849 Interstitial pulmonary disease, unspecified: Secondary | ICD-10-CM | POA: Diagnosis not present

## 2021-11-14 DIAGNOSIS — R5381 Other malaise: Secondary | ICD-10-CM

## 2021-11-14 DIAGNOSIS — J679 Hypersensitivity pneumonitis due to unspecified organic dust: Secondary | ICD-10-CM

## 2021-11-14 DIAGNOSIS — R058 Other specified cough: Secondary | ICD-10-CM

## 2021-11-14 NOTE — Progress Notes (Addendum)
? ? ? ?OV 03/24/2015 ? ?Chief Complaint  ?Patient presents with  ? Follow-up  ?  Pt here after PFT in 01/2015. Pt using nocturnal O2 and has noticed a difference the next day in her breathing. Pt states she has a prod cough with discolored mucus, PND, sinus pressure.   ? ?Stephanie Cortez is here for follow-up with her daughter Stephanie Cortez, they are presenting for review of ILD workup. I'd always followed up with her baseline diagnosis of bronchiectasis but it started becoming more apparent that she actually has ILD. 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 According to the radiologist it has been stable since 2014 at least. Autoimmune workup showed elevated angiotensin-converting enzyme and CK but otherwise normal. Since the diagnosis of elevated CK her primary care physician has stopped her statin. There is no follow-up CK on this. We also notices that she had nocturnal desaturations restarted her on oxygen and sent her to pulmonary rehabilitation. These 2 measures haven't helped her immensely. She still continues a Symbicort for the prior diagnosis of bronchiectasis. She is inclined to continue that. She will have a flu shot today. She wants to know more about interstitial lung disease and had lot of questions about it ? ?OV  ?08/11/2015: Acute office visit: ? ?Patient presents to the office today with complaint of chest congestion and cough with yellow mucus intermittent wheezing and shortness of breath that has been ongoing for about 2-3 weeks, but per patient this has worsened over the last 2-3 days. Patient denies fever, chills, chest pain or hemoptysis. Patient endorses scant bloody mucus from her nose, that she feels this is probably related to her nocturnal oxygen use. She presented to her primary care physician Stephanie Cortez at [facility] and was treated initially with a Z-Pak. When Z-Pak did not resolve patient's symptoms she was then treated with clarithromycin 500 mg twice daily  starting 07/25/2015 for a 10 day treatment period . She is currently still taking this antibiotic. States since starting the clarithromycin that she has noticed some improvement.  ? ? ?OV 09/26/2015 ? ?Chief Complaint  ?Patient presents with  ? Follow-up  ?  Pt here after seeing SG on 08/10/15 for an acute visit. Pt here after 62mt and PFT. Pt states she is still not feeling well. Pt c/o headache, hoarseness, wheezing, prod cough. Pt denies increase in SOB and f/c/s.   ? ?Routine office visit for this lady with ILD/NSIP pattern (previous diagnosis of bronchiectasis and on Symbicort]. Has associated sinus problems. Her daughter [name] not with her today. ? ? ?88year old female presents for follow-up. She is frustrated by her chronic sinus drainage and chronic sinus tension  in the frontal area. This is been going on for many months a few years. She seen Dr. [name] ENT and apparently is planning a procedure. In the last several days to a few weeks she's having increasing headache particularly in the last 3 days. However there is no increase in sinus drainage volume or change in color of the sinus drainage [baseline color is much reading between white and yellow and green] ? ?However in the last day or 2 she's increasing cough and wheezing despite taking Symbicort. ? ?She is frustrated by her symptoms. She wants a definite answer currently same time she is reluctant to have surgical lung biopsy. She is also reluctant to have chronic daily prednisone because of side effects. She is also at the same time  frustrated about her extreme level of curiosity and need to search the Internet about her disease condition and then this works her into an anxiety ? ? ? ? ? ?Spirometry today 09/26/2015 shows FVC 1.55 L/54%, FEV1 1.28 L/59% and a ratio of 83. This comes 6 weeks of her short prednisone burst. This a 300 mL decline in FVC since August 2016 but an improvement by 170 mL since June 2016 ? ? ?6 minute walk test today: Walk  336 m. Resting pulse ox was 98%. Peak exertion pulse ox was 94%. She had back pain but not shortness of breath. ? ?Last CT scan of the chest was June 2016 which showed NSIP pattern ? ?Last CT scan of the sinuses August 2013 that showed mild mucosal edema in the paranasal sinuses ? ? ? ?OV 01/04/2016 ? ?Chief Complaint  ?Patient presents with  ? Follow-up  ?  Breathing has been doing well, same as usual.  Discuss oxygen, has not worn oxygen x 2 weeks.  patient only uses at night, discuss testing to get off oxygen.  Having surgery on spine in August, wants to discuss. Wheezing.   ? ?FU NSIP with air trapping and cough/wheeze - on symbicort (prior dx in 2015) ? ?- Routine fu. Had annual HRCT - stable ILD with air trapping x 2 years. Overall well. Does not want to use nocturnal o2. But having wheeze and cough that is stable but says symbicort is not helping. Also, reports saysShe is having spine surgery to the low spine in the next few months by Dr Stephanie Cortez and she wants know her preoperative pulmonary risk. She also wants to know the basis of her interstitial lung disease which we have discussed in the past but at this point in time her main issues symptoms. Walking desaturation test 185 feet ?3 laps on room at in the office she did not desaturate ? ? ?OV 02/20/2016 ? ?Chief Complaint  ?Patient presents with  ? Acute Visit  ?  Pt c/o prod cough with yellow mucus with scant amount of blood, increase in SOB, wheezing. Pt states the spiriva makes her cough.   ? ?FU NSIP with air trapping and cough/wheeze - on symbicort (prior dx in 2015). ? ?Last visit in July 2017 because of air trapping we took her off Symbicort and tried Spiriva and Brio she called on 02/17/2016 increasing cough. She was rotated back to Symbicort. She clearly attributes the worsening cough and symptoms to Spiriva. She feels that after going on Symbicort she is better. However she is having some yellow mucus and feels she is having acute bronchitis. She  has multiple drug allergies listed below. Regarding her interstitial lung disease this is been stable for 2 years. Last visit walking desaturation test did not desaturate. She then had overnight oxygen study 02/07/2016 and results are below and she does not need nocturnal oxygen. Therefore she will not need sleep study ? ?OV 04/04/2017 ? ?Chief Complaint  ?Patient presents with  ? Follow-up  ?  Pt states that she has been doing well. Breathing is at a stable point at this moment. Occ. cough and occ. SOB. Denies any CP.  ? ? ?Follow-up NSIP with a trapping and cough/wheeze and Symbicort responsive  ? ?83-year-ol overall stable. No new complaints. Not seen her in over a year. No interim health issues other than back pain for which she is avoiding back surgery. She is in need of a flu shot. She feels Symbicort is working well for  her and she wants to continue this. Without Symbicort she decompensated. Walking desaturation test 185 feet ?3 laps on room air: Resting heart rate 63. Final 197. Resting pulse ox 100%. Final pulse ox 99%. ? ? ?08/19/2017 acute extended ov/Wert re: acute cough in setting of flare of rhinitis? sinusitis ?Chief Complaint  ?Patient presents with  ? Acute Visit  ?  wheezing,SOB with activity,productive cough with green, thick sputum, has started mucinex   ?nasal symptoms flared  X one week prior to OV with facial pressure/ nasal congestion  Then worse cough/ wheeze with mucus turning green, some epistaxis but no green nasal d/c ?Not normally needing any kind of rescue rx / does not even have one  ?symptoms Worse hs and off and on during  noct / props up on several pillows helps some ?Not limited by breathing from desired activities  But very sedentary  ?rec ?Omnicef 300 mg twice daily x 10 days ?Prednisone 10 mg take  4 each am x 2 days,   2 each am x 2 days,  1 each am x 2 days and stop  ?I emphasized that nasal steroids (flonase) ave no immediate benefit in terms of improving symptoms.  To help  them reached the target tissue, the patient should use Afrin two puffs every 12 hours  ?Work on Gaffer:   ?- late add: should notify coumadin adviser re 10 day course of abx  ? ? ? ? ?
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
## 2021-11-16 ENCOUNTER — Ambulatory Visit: Payer: Medicare Other

## 2021-11-16 ENCOUNTER — Ambulatory Visit: Payer: Medicare Other | Admitting: Occupational Therapy

## 2021-11-16 DIAGNOSIS — M6281 Muscle weakness (generalized): Secondary | ICD-10-CM | POA: Diagnosis not present

## 2021-11-16 DIAGNOSIS — R2681 Unsteadiness on feet: Secondary | ICD-10-CM

## 2021-11-16 DIAGNOSIS — R2689 Other abnormalities of gait and mobility: Secondary | ICD-10-CM

## 2021-11-16 DIAGNOSIS — R262 Difficulty in walking, not elsewhere classified: Secondary | ICD-10-CM | POA: Diagnosis not present

## 2021-11-16 NOTE — Therapy (Signed)
OUTPATIENT OCCUPATIONAL THERAPY TREATMENT NOTE   Patient Name: Stephanie Cortez MRN: [id number] DOB:10/05/1933, 88 y.o., female 72 Date: 11/16/2021  PCP: [name], MD  REFERRING PROVIDER: Brand Males, MD   END OF SESSION:   OT End of Session - 11/16/21 1447     Visit Number 4    Number of Visits 13    Date for OT Re-Evaluation 12/15/21    Authorization Type Medicare A & B    Authorization - Visit Number 4    Progress Note Due on Visit 10    OT Start Time 8466    OT Stop Time 1447    OT Time Calculation (min) 43 min    Activity Tolerance Patient tolerated treatment well    Behavior During Therapy WFL for tasks assessed/performed              Past Medical History:  Diagnosis Date   Anemia    - Hgb 9.7gm% on 07/13/2008 in [place] 129gm% wiht normal irone levsl and ferritin 10/27/2008 in [place] Recurrent otitis/sinusitis   Anxiety    chronic BZ prn   Atrial fibrillation ([place])    chronic anticoag   Bronchiectasis    >PFT 07/13/2008 in [place] 1.9L/76%, FVC 2.45L/74, Ratio 79, TLC 121%, DLCO 64%  AE BRonchiectasis - Dec 2010.New Rx:  outpatient - Feb 2011 - Rx outpatient   CHF (congestive heart failure) ([place])    COPD (chronic obstructive pulmonary disease) (HCC)    bronchiectasis   Cricopharyngeal achalasia    Depression    Diverticulosis    Dyslipidemia    Eczema    Fatty liver    GERD with stricture    Glaucoma    H. 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[place])    Ventral hernia    Vertigo    Wears glasses    Past Surgical History:  Procedure Laterality Date   BOWEL RESECTION N/A 02/08/2020   Procedure: SMALL BOWEL RESECTION;  Surgeon: Donnie Mesa, MD;  Location: [facility];  Service: General;  Laterality: N/A;   BREAST SURGERY     br bx   CARDIAC CATHETERIZATION   07/01/2013   CATARACT EXTRACTION     both   COLONOSCOPY     ESOPHAGOSCOPY W/ BOTOX INJECTION  12/11/2011   Procedure: ESOPHAGOSCOPY WITH BOTOX INJECTION;  Surgeon: Rozetta Nunnery, MD;  Location: [facility];  Service: ENT;  Laterality: N/A;  esophogoscopy with dilation, botox injection   FOOT SURGERY  03/14/2011   gastroc slide-rt   GASTROSTOMY N/A 02/08/2020   Procedure: INSERTION OF GASTROSTOMY TUBE;  Surgeon: Donnie Mesa, MD;  Location: [facility];  Service: General;  Laterality: N/A;   HIP ARTHROPLASTY Right 10/28/2019   Procedure: ARTHROPLASTY BIPOLAR HIP (HEMIARTHROPLASTY);  Surgeon: Marybelle Killings, MD;  Location: [facility];  Service: Orthopedics;  Laterality: Right;   LAPAROTOMY N/A 02/08/2020   Procedure: EXPLORATORY LAPAROTOMY;  Surgeon: Donnie Mesa, MD;  Location: [facility];  Service: General;  Laterality: N/A;   LYSIS OF ADHESION N/A 02/08/2020   Procedure: LYSIS OF ADHESIONS;  Surgeon: Donnie Mesa, MD;  Location: [facility];  Service: General;  Laterality: N/A;   TOTAL ABDOMINAL HYSTERECTOMY     TOTAL HIP REVISION Right 11/16/2019  Procedure: TOTAL HIP REVISION BIPOLAR TO CEMENTED BIPOLAR;  Surgeon: Marybelle Killings, MD;  Location: [facility];  Service: Orthopedics;  Laterality: Right;   WISDOM TOOTH EXTRACTION     Patient Active Problem List   Diagnosis Date Noted   Palliative care encounter 07/27/2021   Constipation 07/27/2021   Cricopharyngeal achalasia 06/24/2021   Disorder of cervical esophageal region 06/23/2021   Pressure injury of skin 06/21/2021   Physical deconditioning 06/21/2021   SOB (shortness of breath) 06/18/2021   Acute on chronic respiratory failure with hypoxia (HCC) 06/18/2021   Chronic diastolic CHF (congestive heart failure) ([place]) 06/18/2021   Chronic anemia 06/18/2021   COVID-19 virus infection 06/17/2021   Chronic pain of right knee 06/20/2020   Abnormality of gait 06/20/2020   Malnutrition of moderate degree 02/25/2020   S/P percutaneous endoscopic  gastrostomy (PEG) tube placement (HCC)    Post-operative pain    Subtherapeutic international normalized ratio (INR)    Debility 02/17/2020   Mesenteric ischemia (HCC)    Sepsis ([place]) 02/08/2020   Small bowel ischemia ([place]) 02/08/2020   Thrombocytosis 02/08/2020   Abnormal CT of the abdomen    Periprosthetic fracture around internal prosthetic hip joint 11/16/2019   Periprosthetic fracture around internal prosthetic hip joint, initial encounter    Chronic obstructive pulmonary disease (HCC)    Essential hypertension    Chronic anticoagulation    Acute blood loss anemia    Transaminitis    Hypoalbuminemia due to protein-calorie malnutrition (HCC)    Labile blood pressure    Drug induced constipation    Postoperative pain    Subcapital fracture of femur ([place]) 10/30/2019   Post-operative state    Prerenal azotemia    Leukocytosis    Closed right hip fracture, initial encounter ([place]) 10/27/2019   Hoarseness 02/19/2018   Sensorineural hearing loss (SNHL) of both ears 02/19/2018   Tinnitus, bilateral 02/19/2018   Cough 09/06/2017   Cough variant asthma 08/19/2017   Acute bronchitis 02/20/2016   Pulmonary air trapping 01/04/2016   Encounter for preoperative pulmonary examination 01/04/2016   NSIP (nonspecific interstitial pneumonia) ([place]) 09/26/2015   Chronic sinusitis 09/26/2015   ILD (interstitial lung disease) ([place]) 07/12/2014   Upper airway cough syndrome 09/23/2013   Encounter for therapeutic drug monitoring 08/11/2013   Congestive heart failure ([place]) 07/08/2013   Coronary artery calcification seen on CAT scan 03/15/2013   Nodule of left lung 03/15/2013   Orthostatic lightheadedness 02/09/2013   Depression    Diverticulitis 01/01/2012   GERD (gastroesophageal reflux disease) 01/01/2012   Hypothyroid 11/23/2010   Edema 10/04/2010   Bronchiectasis ([place])    HYPERTENSION, BENIGN 10/22/2008   A-fib ([place])     ONSET DATE: 10/06/2021 (referral date)   REFERRING DIAG:  R53.81 (ICD-10-CM) - Other malaise J84.9 (ICD-10-CM) - Interstitial pulmonary disease, unspecified   THERAPY DIAG:  Muscle weakness (generalized)  Unsteadiness on feet   PERTINENT HISTORY: right hip fx s/p THA and required revision, COPD, A-fib, CHF   PRECAUTIONS: Fall  SUBJECTIVE: "Both of my calves are hurting"  PAIN:  Are you having pain? Yes: NPRS scale: 6/10 Pain location: B calves Pain description: dull, tight, uncomfortable Aggravating factors: mobility Relieving factors: rest     OBJECTIVE:   TODAY'S TREATMENT: -Therapeutic activity: Reviewed HEP with focus on weight shifting through RLE when in standing as needed for engagement in ADLs and IADLs.  Engaged in star reach/toe tapping out to side and forward/backward diagonals.  Pt able to complete while standing on LLE while tapping  with RLE.  Pt required seated rest break before completing while standing on RLE.  Pt able to complete  1 set of 10 each direction while maintaining WB through RLE, allowing for increased weight shifting in various directions as needed to increase stability and safety during self-care tasks.  Pt required UE support on elevated mat for improved stability and safety.  Transitional movements: Engaged in sit > stand x6 throughout session with focus on increased anterior weight shift and decreased reliance on UE during sit > stand.  Pt demonstrating improved placement of RLE during transitional movements as well, not requiring extension as much - therefore tolerating increased hip flexion during transitional movements.  Theraband exercises for BUE strengthening and generalized endurance. Utilized red theraband. Therapist providing mod demonstration cues and intermittent tactile cues for proper positioning and technique. - Seated Shoulder Horizontal Abduction with Resistance  1 sets - 10 reps - Seated Diagonal Reach with Resistance  1 sets - 10 reps - Seated Shoulder Flexion with Self-Anchored Resistance   1 sets - 10 reps - Seated Shoulder Extension with Self-Anchored Resistance  1 sets - 10 reps - Seated Elbow Flexion with Self-Anchored Resistance  1 sets - 10 reps - Seated Elbow Extension with Self-Anchored Resistance  1 sets - 10 reps   PATIENT EDUCATION: Education details: Educated on functional mobility and activity tolerance with increasing weight shifting through RLE in standing activities.   Person educated: Patient Education method: Explanation Education comprehension: verbalized understanding     HOME EXERCISE PROGRAM: Access Code: [email or web address] URL: [email or web address] Date: 11/16/2021 Prepared by: [place] with Feet Apart and Hands Down   - 1 x daily - 3 x weekly - 1 sets - 10 reps - Single Leg Balance with Opposite Leg Star Reach  - 1 x daily - 3 x weekly - 1 sets - 10 reps - Seated Single Knee to Chest  - 1 x daily - 3 x weekly - 1 sets - 5-10 reps - Seated Shoulder Horizontal Abduction with Resistance  - 1 x daily - 3 x weekly - 1 sets - 10 reps - Seated Diagonal Reach with Resistance  - 1 x daily - 3 x weekly - 1 sets - 10 reps - Seated Shoulder Flexion with Self-Anchored Resistance  - 1 x daily - 3 x weekly - 1 sets - 10 reps - Seated Shoulder Extension with Self-Anchored Resistance  - 3 x weekly - 1 sets - 10 reps - Seated Elbow Flexion with Self-Anchored Resistance  - 1 x daily - 3 x weekly - 1 sets - 10 reps - Seated Elbow Extension with Self-Anchored Resistance  - 1 x daily - 3 x weekly - 1 sets - 10 reps       GOALS: Goals reviewed with patient? Yes   SHORT TERM GOALS: Target date: 11/24/2021   Pt will be independent in HEP to address BUE strengthening and conditioning to increase independence with ADLs and IADLs Baseline: Goal status: on-going   2.  Pt will verbalize energy conservation strategies and provide 2 examples that she is incorporating to increase her  safety/independence with ADLs and IADLs. Baseline:  Goal status: on-going     LONG TERM GOALS: Target date:  12/15/21   Pt will demonstrate improved strength in BUE to open refrigerator door with increased ease and no increase in pain. Baseline: R: 36 and L: 24 Goal status: on-going   2.  Pt  will be able to demonstrate safe strategy to pick items up from the floor with/without use of AE as appropriate. Baseline:  Goal status: on-going   3.  Pt will demonstrate improved ROM in BUE to allow for increased ease with obtaining items from shelves in cabinets and refrigerator. Baseline: Shoulder flexion L:135 and R: 140 Goal status: on-going   4.  Pt will demonstrate improved ability to complete sit > stand from various height surfaces to increase independence with functional transfers without cues or assistance. Baseline:  Goal status: on-going     ASSESSMENT:   CLINICAL IMPRESSION: Pt continues to demonstrate improving anterior weight shift with sit > stand and weight shifting through RLE, demonstrating external rotation at hip and decreased acceptance of weight during structured tasks and transitional movements.  Pt continues to demonstrate decreased endurance for tasks, requiring seated rest breaks.  Pt tolerated UE strengthening with red theraband in sitting, requiring mod multimodal cues for hand placement and technique.   PERFORMANCE DEFICITS in functional skills including ADLs, IADLs, dexterity, ROM, strength, pain, flexibility, GMC, mobility, balance, body mechanics, endurance, cardiopulmonary status limiting function, decreased knowledge of precautions, decreased knowledge of use of DME, and UE functional use and psychosocial skills including environmental adaptation.    IMPAIRMENTS are limiting patient from ADLs and IADLs.    COMORBIDITIES may have co-morbidities  that affects occupational performance. Patient will benefit from skilled OT to address above impairments and improve  overall function.   MODIFICATION OR ASSISTANCE TO COMPLETE EVALUATION: Min-Moderate modification of tasks or assist with assess necessary to complete an evaluation.   OT OCCUPATIONAL PROFILE AND HISTORY: Detailed assessment: Review of records and additional review of physical, cognitive, psychosocial history related to current functional performance.   CLINICAL DECISION MAKING: Moderate - several treatment options, min-mod task modification necessary   REHAB POTENTIAL: Fair    EVALUATION COMPLEXITY: Moderate       PLAN: OT FREQUENCY: 2x/week   OT DURATION: 6 weeks   PLANNED INTERVENTIONS: self care/ADL training, therapeutic exercise, therapeutic activity, neuromuscular re-education, manual therapy, passive range of motion, balance training, functional mobility training, patient/family education, psychosocial skills training, energy conservation, coping strategies training, and DME and/or AE instructions   RECOMMENDED OTHER SERVICES: NA   CONSULTED AND AGREED WITH PLAN OF CARE: Patient and family member/caregiver   PLAN FOR NEXT SESSION: Review theraband and modified yoga HEP; theraputty for hand strengthening   Emerson Barretto, [place], OTR/L 11/16/2021, 2:51 PM

## 2021-11-16 NOTE — Therapy (Signed)
[facility] [address], [place], [place], [address] Phone: [phone]   Fax:  [phone]  Physical Therapy Treatment  Patient Details  Name: Stephanie Cortez MRN: [id number] Date of Birth: 04-02-1934 Referring Provider (PT): Brand Males, MD Rationale for Evaluation and Treatment Rehabilitation  Encounter Date: 11/16/2021   PT End of Session - 11/16/21 1505     Visit Number 6    Number of Visits 16    Date for PT Re-Evaluation 12/21/21    Authorization Type Medicare A and B/ [employer] Supplemental 2023    Progress Note Due on Visit 10    PT Start Time 4259    PT Stop Time 5638    PT Time Calculation (min) 45 min    Activity Tolerance Patient tolerated treatment well    Behavior During Therapy [facility] for tasks assessed/performed             Past Medical History:  Diagnosis Date   Anemia    - Hgb 9.7gm% on 07/13/2008 in [place] 129gm% wiht normal irone levsl and ferritin 10/27/2008 in [place] Recurrent otitis/sinusitis   Anxiety    chronic BZ prn   Atrial fibrillation ([place])    chronic anticoag   Bronchiectasis    >PFT 07/13/2008 in [place] 1.9L/76%, FVC 2.45L/74, Ratio 79, TLC 121%, DLCO 64%  AE BRonchiectasis - Dec 2010.New Rx:  outpatient - Feb 2011 - Rx outpatient   CHF (congestive heart failure) ([place])    COPD (chronic obstructive pulmonary disease) (HCC)    bronchiectasis   Cricopharyngeal achalasia    Depression    Diverticulosis    Dyslipidemia    Eczema    Fatty liver    GERD with stricture    Glaucoma    H. 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[place])    Ventral hernia    Vertigo    Wears glasses     Past Surgical History:  Procedure Laterality Date   BOWEL RESECTION N/A 02/08/2020   Procedure: SMALL BOWEL RESECTION;  Surgeon:  Donnie Mesa, MD;  Location: [place];  Service: General;  Laterality: N/A;   BREAST SURGERY     br bx   CARDIAC CATHETERIZATION  07/01/2013   CATARACT EXTRACTION     both   COLONOSCOPY     ESOPHAGOSCOPY W/ BOTOX INJECTION  12/11/2011   Procedure: ESOPHAGOSCOPY WITH BOTOX INJECTION;  Surgeon: Rozetta Nunnery, MD;  Location: [place];  Service: ENT;  Laterality: N/A;  esophogoscopy with dilation, botox injection   FOOT SURGERY  03/14/2011   gastroc slide-rt   GASTROSTOMY N/A 02/08/2020   Procedure: INSERTION OF GASTROSTOMY TUBE;  Surgeon: Donnie Mesa, MD;  Location: [place];  Service: General;  Laterality: N/A;   HIP ARTHROPLASTY Right 10/28/2019   Procedure: ARTHROPLASTY BIPOLAR HIP (HEMIARTHROPLASTY);  Surgeon: Marybelle Killings, MD;  [facility];  Service: Orthopedics;  Laterality: Right;   LAPAROTOMY N/A 02/08/2020   Procedure: EXPLORATORY LAPAROTOMY;  Surgeon: Donnie Mesa, MD;  Location: [place];  Service: General;  Laterality: N/A;   LYSIS OF ADHESION N/A 02/08/2020   Procedure: LYSIS OF ADHESIONS;  Surgeon: Donnie Mesa, MD;  Location: [place];  Service: General;  Laterality: N/A;  TOTAL ABDOMINAL HYSTERECTOMY     TOTAL HIP REVISION Right 11/16/2019   Procedure: TOTAL HIP REVISION BIPOLAR TO CEMENTED BIPOLAR;  Surgeon: Marybelle Killings, MD;  Location: [place];  Service: Orthopedics;  Laterality: Right;   WISDOM TOOTH EXTRACTION      There were no vitals filed for this visit.   Subjective Assessment - 11/16/21 1450     Subjective Not feeling too bad today    Patient is accompained by: Family member   daughter   Pertinent History right hip fx s/p THA and required revision, COPD, A-fib, CHF    Limitations Lifting;Standing;Walking;House hold activities    How long can you sit comfortably? 15-30 min    How long can you stand comfortably? varies    Patient Stated Goals decrease right hip pain, improve strength/balance/walking    Currently in Pain? Yes    Pain Score 6      Pain Location Hip    Pain Orientation Right;Lateral    Pain Descriptors / Indicators Tiring;Sore    Pain Type Chronic pain    Pain Onset More than a month ago                [facility] PT Assessment - 11/16/21 0001       Assessment   Medical Diagnosis ILD/Deconditioning    Referring Provider (PT) Brand Males, MD                           [facility] Adult PT Treatment/Exercise - 11/16/21 0001       Knee/Hip Exercises: Standing   Forward Step Up Both;1 set;5 reps;Step Height: 4";Hand Hold: 2      Knee/Hip Exercises: Seated   Heel Slides AAROM;Right;3 sets;10 reps    Heel Slides Limitations 5 sec stretch for right quad, achieves 90-95 degrees flexion                 Balance Exercises - 11/16/21 0001       Balance Exercises: Standing   Standing Eyes Opened Narrow base of support (BOS);Wide (BOA);Foam/compliant surface;3 reps;30 secs    Standing Eyes Closed Wide (BOA);Foam/compliant surface;2 reps;30 secs    Standing, One Foot on a Step Eyes open;3 reps;30 secs;8 inch   LLE only elevated   Partial Tandem Stance Eyes open;Foam/compliant surface;Intermittent upper extremity support;3 reps;30 secs   one foot on airex pad, one foot on stair tread   Retro Gait Other (comment)   CGA x 15 ft w/ RW   Sidestepping Other (comment)   x 2 min along counter top                 PT Short Term Goals - 10/26/21 1704       PT SHORT TERM GOAL #1   Title Patient will be independent in HEP to improve functional outcomes    Time 4    Period Weeks    Status New    Target Date 11/23/21      PT SHORT TERM GOAL #2   Title Demonstrate right hip flexion to 90 degrees and right knee flexion to 100 degrees to improve body mechanics for transfers    Baseline 70 degrees hip flexion, 92 right knee flexion    Time 4    Period Weeks    Status New    Target Date 11/23/21      PT SHORT TERM GOAL #3   Title Manifest improved BLE strength per time of 30  sec 5xSTS  test    Baseline 38.56"    Time 4    Period Weeks    Status New    Target Date 11/23/21               PT Long Term Goals - 10/26/21 1706       PT LONG TERM GOAL #1   Title Demo reduced risk for falls per TUG test time 20 sec    Baseline 59.03 sec    Time 8    Period Weeks    Status New    Target Date 12/21/21      PT LONG TERM GOAL #2   Title Manifest improved BLE strength per time 25 sec 5xSTS    Time 8    Period Weeks    Status New    Target Date 12/21/21                   Plan - 11/16/21 1537     Clinical Impression Statement Able to progress static standing balance activities today to include standing on compliant surfaces with decreased dependence on BUE support progressing to static standing compliant/eyes closed without UE support albeit with increased postural sway and two instances LOB requiring tactile cues to correct. Difficuly with RLE limb advancement for step up/down from 4" step due to limited hip flexion. Continued sessions indicated to progress LE ROM, strength, and balance to improve mobility and reduce risk fo rfalls    Personal Factors and Comorbidities Age;Comorbidity 3+;Time since onset of injury/illness/exacerbation    Comorbidities right LE orthopedics, COPD, cardiac conditions    Examination-Activity Limitations Carry;Dressing;Lift;Toileting;Stand;Stairs;Squat;Locomotion Level;Transfers    Examination-Participation Restrictions Cleaning;Community Activity;Driving;Yard Work;Occupation;Meal Prep    Stability/Clinical Decision Making Evolving/Moderate complexity    Rehab Potential Fair    PT Frequency 2x / week    PT Duration 8 weeks    PT Treatment/Interventions ADLs/Self Care Home Management;Aquatic Therapy;Electrical Stimulation;DME Instruction;Gait training;Stair training;Functional mobility training;Therapeutic activities;Therapeutic exercise;Balance training;Neuromuscular re-education;Manual techniques;Passive range of motion;Dry  needling;Energy conservation;Joint Manipulations;Vestibular;Taping;Patient/family education    PT Next Visit Plan AAROM, PROM in supine    PT Home Exercise Plan [id number] seated knee flexion AAROM, hip add squeeze, standing with LLE elevated on 6" step, sidestepping, heel slides with strap    Consulted and Agree with Plan of Care Patient;Family member/caregiver    Family Member Consulted daughter             Patient will benefit from skilled therapeutic intervention in order to improve the following deficits and impairments:  Abnormal gait, Decreased activity tolerance, Decreased balance, Decreased mobility, Decreased range of motion, Decreased strength, Difficulty walking, Impaired flexibility, Improper body mechanics, Pain  Visit Diagnosis: Muscle weakness (generalized)  Unsteadiness on feet  Difficulty in walking, not elsewhere classified  Other abnormalities of gait and mobility     Problem List Patient Active Problem List   Diagnosis Date Noted   Palliative care encounter 07/27/2021   Constipation 07/27/2021   Cricopharyngeal achalasia 06/24/2021   Disorder of cervical esophageal region 06/23/2021   Pressure injury of skin 06/21/2021   Physical deconditioning 06/21/2021   SOB (shortness of breath) 06/18/2021   Acute on chronic respiratory failure with hypoxia (HCC) 06/18/2021   Chronic diastolic CHF (congestive heart failure) ([place]) 06/18/2021   Chronic anemia 06/18/2021   COVID-19 virus infection 06/17/2021   Chronic pain of right knee 06/20/2020   Abnormality of gait 06/20/2020   Malnutrition of moderate degree 02/25/2020   S/P percutaneous endoscopic gastrostomy (PEG) tube  placement ([place])    Post-operative pain    Subtherapeutic international normalized ratio (INR)    Debility 02/17/2020   Mesenteric ischemia ([place])    Sepsis ([place]) 02/08/2020   Small bowel ischemia ([place]) 02/08/2020   Thrombocytosis 02/08/2020   Abnormal CT of the abdomen    Periprosthetic  fracture around internal prosthetic hip joint 11/16/2019   Periprosthetic fracture around internal prosthetic hip joint, initial encounter    Chronic obstructive pulmonary disease (HCC)    Essential hypertension    Chronic anticoagulation    Acute blood loss anemia    Transaminitis    Hypoalbuminemia due to protein-calorie malnutrition (HCC)    Labile blood pressure    Drug induced constipation    Postoperative pain    Subcapital fracture of femur ([place]) 10/30/2019   Post-operative state    Prerenal azotemia    Leukocytosis    Closed right hip fracture, initial encounter ([place]) 10/27/2019   Hoarseness 02/19/2018   Sensorineural hearing loss (SNHL) of both ears 02/19/2018   Tinnitus, bilateral 02/19/2018   Cough 09/06/2017   Cough variant asthma 08/19/2017   Acute bronchitis 02/20/2016   Pulmonary air trapping 01/04/2016   Encounter for preoperative pulmonary examination 01/04/2016   NSIP (nonspecific interstitial pneumonia) ([place]) 09/26/2015   Chronic sinusitis 09/26/2015   ILD (interstitial lung disease) ([place]) 07/12/2014   Upper airway cough syndrome 09/23/2013   Encounter for therapeutic drug monitoring 08/11/2013   Congestive heart failure ([place]) 07/08/2013   Coronary artery calcification seen on CAT scan 03/15/2013   Nodule of left lung 03/15/2013   Orthostatic lightheadedness 02/09/2013   Depression    Diverticulitis 01/01/2012   GERD (gastroesophageal reflux disease) 01/01/2012   Hypothyroid 11/23/2010   Edema 10/04/2010   Bronchiectasis ([place])    HYPERTENSION, BENIGN 10/22/2008   A-fib ([place])     Toniann Fail, PT 11/16/2021, 3:41 PM  [facility] [address], [place], [place], [address] Phone: [phone]   Fax:  [phone]  Name: Stephanie Cortez MRN: [id number] Date of Birth: 1934/03/03

## 2021-11-20 ENCOUNTER — Ambulatory Visit: Payer: Medicare Other | Admitting: Internal Medicine

## 2021-11-21 ENCOUNTER — Ambulatory Visit: Payer: Medicare Other | Admitting: Occupational Therapy

## 2021-11-21 ENCOUNTER — Ambulatory Visit: Payer: Medicare Other

## 2021-11-23 ENCOUNTER — Ambulatory Visit: Payer: Medicare Other | Admitting: Occupational Therapy

## 2021-11-23 ENCOUNTER — Ambulatory Visit: Payer: Medicare Other

## 2021-11-23 DIAGNOSIS — M6281 Muscle weakness (generalized): Secondary | ICD-10-CM

## 2021-11-23 DIAGNOSIS — R2681 Unsteadiness on feet: Secondary | ICD-10-CM

## 2021-11-23 DIAGNOSIS — R262 Difficulty in walking, not elsewhere classified: Secondary | ICD-10-CM | POA: Diagnosis not present

## 2021-11-23 DIAGNOSIS — R2689 Other abnormalities of gait and mobility: Secondary | ICD-10-CM | POA: Diagnosis not present

## 2021-11-23 NOTE — Therapy (Signed)
OUTPATIENT OCCUPATIONAL THERAPY TREATMENT NOTE   Patient Name: Stephanie Cortez MRN: [id number] DOB:January 22, 1934, 88 y.o., female Today's Date: 11/23/2021  PCP: [name], MD  REFERRING PROVIDER: Brand Males, MD   END OF SESSION:   OT End of Session - 11/23/21 1608     Visit Number 5    Number of Visits 13    Date for OT Re-Evaluation 12/15/21    Authorization Type Medicare A & B    Authorization - Visit Number 5    Progress Note Due on Visit 10    OT Start Time 1017    OT Stop Time 1447    OT Time Calculation (min) 42 min    Activity Tolerance Patient tolerated treatment well    Behavior During Therapy WFL for tasks assessed/performed               Past Medical History:  Diagnosis Date   Anemia    - Hgb 9.7gm% on 07/13/2008 in [place] 129gm% wiht normal irone levsl and ferritin 10/27/2008 in [place] Recurrent otitis/sinusitis   Anxiety    chronic BZ prn   Atrial fibrillation ([place])    chronic anticoag   Bronchiectasis    >PFT 07/13/2008 in [place] 1.9L/76%, FVC 2.45L/74, Ratio 79, TLC 121%, DLCO 64%  AE BRonchiectasis - Dec 2010.New Rx:  outpatient - Feb 2011 - Rx outpatient   CHF (congestive heart failure) ([place])    COPD (chronic obstructive pulmonary disease) (HCC)    bronchiectasis   Cricopharyngeal achalasia    Depression    Diverticulosis    Dyslipidemia    Eczema    Fatty liver    GERD with stricture    Glaucoma    H. 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[place])    Ventral hernia    Vertigo    Wears glasses    Past Surgical History:  Procedure Laterality Date   BOWEL RESECTION N/A 02/08/2020   Procedure: SMALL BOWEL RESECTION;  Surgeon: Donnie Mesa, MD;  Location: [facility];  Service: General;  Laterality: N/A;   BREAST SURGERY     br bx   CARDIAC CATHETERIZATION   07/01/2013   CATARACT EXTRACTION     both   COLONOSCOPY     ESOPHAGOSCOPY W/ BOTOX INJECTION  12/11/2011   Procedure: ESOPHAGOSCOPY WITH BOTOX INJECTION;  Surgeon: Rozetta Nunnery, MD;  Location: [place];  Service: ENT;  Laterality: N/A;  esophogoscopy with dilation, botox injection   FOOT SURGERY  03/14/2011   gastroc slide-rt   GASTROSTOMY N/A 02/08/2020   Procedure: INSERTION OF GASTROSTOMY TUBE;  Surgeon: Donnie Mesa, MD;  Location: [facility];  Service: General;  Laterality: N/A;   HIP ARTHROPLASTY Right 10/28/2019   Procedure: ARTHROPLASTY BIPOLAR HIP (HEMIARTHROPLASTY);  Surgeon: Marybelle Killings, MD;  [facility];  Service: Orthopedics;  Laterality: Right;   LAPAROTOMY N/A 02/08/2020   Procedure: EXPLORATORY LAPAROTOMY;  Surgeon: Donnie Mesa, MD;  Location: [place];  Service: General;  Laterality: N/A;   LYSIS OF ADHESION N/A 02/08/2020   Procedure: LYSIS OF ADHESIONS;  Surgeon: Donnie Mesa, MD;  Location: [facility];  Service: General;  Laterality: N/A;   TOTAL ABDOMINAL HYSTERECTOMY     TOTAL HIP REVISION Right 11/16/2019  Procedure: TOTAL HIP REVISION BIPOLAR TO CEMENTED BIPOLAR;  Surgeon: Marybelle Killings, MD;  Location: [place];  Service: Orthopedics;  Laterality: Right;   WISDOM TOOTH EXTRACTION     Patient Active Problem List   Diagnosis Date Noted   Palliative care encounter 07/27/2021   Constipation 07/27/2021   Cricopharyngeal achalasia 06/24/2021   Disorder of cervical esophageal region 06/23/2021   Pressure injury of skin 06/21/2021   Physical deconditioning 06/21/2021   SOB (shortness of breath) 06/18/2021   Acute on chronic respiratory failure with hypoxia (HCC) 06/18/2021   Chronic diastolic CHF (congestive heart failure) ([place]) 06/18/2021   Chronic anemia 06/18/2021   COVID-19 virus infection 06/17/2021   Chronic pain of right knee 06/20/2020   Abnormality of gait 06/20/2020   Malnutrition of moderate degree 02/25/2020   S/P percutaneous endoscopic  gastrostomy (PEG) tube placement (HCC)    Post-operative pain    Subtherapeutic international normalized ratio (INR)    Debility 02/17/2020   Mesenteric ischemia (HCC)    Sepsis ([place]) 02/08/2020   Small bowel ischemia ([place]) 02/08/2020   Thrombocytosis 02/08/2020   Abnormal CT of the abdomen    Periprosthetic fracture around internal prosthetic hip joint 11/16/2019   Periprosthetic fracture around internal prosthetic hip joint, initial encounter    Chronic obstructive pulmonary disease (HCC)    Essential hypertension    Chronic anticoagulation    Acute blood loss anemia    Transaminitis    Hypoalbuminemia due to protein-calorie malnutrition (HCC)    Labile blood pressure    Drug induced constipation    Postoperative pain    Subcapital fracture of femur ([place]) 10/30/2019   Post-operative state    Prerenal azotemia    Leukocytosis    Closed right hip fracture, initial encounter ([place]) 10/27/2019   Hoarseness 02/19/2018   Sensorineural hearing loss (SNHL) of both ears 02/19/2018   Tinnitus, bilateral 02/19/2018   Cough 09/06/2017   Cough variant asthma 08/19/2017   Acute bronchitis 02/20/2016   Pulmonary air trapping 01/04/2016   Encounter for preoperative pulmonary examination 01/04/2016   NSIP (nonspecific interstitial pneumonia) ([place]) 09/26/2015   Chronic sinusitis 09/26/2015   ILD (interstitial lung disease) ([place]) 07/12/2014   Upper airway cough syndrome 09/23/2013   Encounter for therapeutic drug monitoring 08/11/2013   Congestive heart failure ([place]) 07/08/2013   Coronary artery calcification seen on CAT scan 03/15/2013   Nodule of left lung 03/15/2013   Orthostatic lightheadedness 02/09/2013   Depression    Diverticulitis 01/01/2012   GERD (gastroesophageal reflux disease) 01/01/2012   Hypothyroid 11/23/2010   Edema 10/04/2010   Bronchiectasis ([place])    HYPERTENSION, BENIGN 10/22/2008   A-fib ([place])     ONSET DATE: 10/06/2021 (referral date)   REFERRING DIAG:  R53.81 (ICD-10-CM) - Other malaise J84.9 (ICD-10-CM) - Interstitial pulmonary disease, unspecified   THERAPY DIAG:  Muscle weakness (generalized)  Unsteadiness on feet   PERTINENT HISTORY: right hip fx s/p THA and required revision, COPD, A-fib, CHF   PRECAUTIONS: Fall  SUBJECTIVE: "I'm feeling a lot better than the other day"  PAIN:  Are you having pain? Yes: NPRS scale: 8/10 Pain location: R hip Pain description: dull, tight, uncomfortable Aggravating factors: mobility Relieving factors: rest     OBJECTIVE:   TODAY'S TREATMENT: - Therapeutic activity: Reviewed theraband UE HEP for BUE strengthening and generalized endurance. Therapist providing mod demonstration cues and intermittent tactile cues for proper positioning and technique.  Pt frequently utilizing compensatory strategies, requiring cues to focus on quality of movement.  Encouraged pt to incorporate into daily routine to increase carryover. - Seated Shoulder Horizontal Abduction with Resistance  - Seated Diagonal Reach with Resistance  - Seated Shoulder Flexion with Self-Anchored Resistance   - Seated Shoulder Extension with Self-Anchored Resistance   - Seated Elbow Flexion with Self-Anchored Resistance  - Seated Elbow Extension with Self-Anchored Resistance    - Energy conservation:  Therapist encouraged pt to report 2 strategies that she incorporates at home to increase her safety/independence.  Pt discussed her technique with making coffee (from emptying coffee maker to making coffee).  Pt reports she has all of her coffee items in close proximity to each other (positioning).  Pt with difficulty identifying additional example.  Therapist provided pt with additional examples, with pt then able to report that she does often set out clothing the night before (planning).  - Transitional movements: Engaged in sit > stand x3 throughout session with focus on increased anterior weight shift and decreased reliance on UE  during sit > stand.  Pt demonstrating improved placement of RLE during transitional movements as well, not requiring as much extension - therefore tolerating increased hip flexion during transitional movements.     PATIENT EDUCATION: Education details: Educated on functional mobility and improved activity tolerance, reiterated importance of HEP for endurance Person educated: Patient and daughter Education method: Explanation, handout Education comprehension: verbalized understanding     HOME EXERCISE PROGRAM: Access Code: [email or web address]        GOALS: Goals reviewed with patient? Yes   SHORT TERM GOALS: Target date: 11/24/2021   Pt will be independent in HEP to address BUE strengthening and conditioning to increase independence with ADLs and IADLs Baseline: Goal status: partially met   2.  Pt will verbalize energy conservation strategies and provide 2 examples that she is incorporating to increase her safety/independence with ADLs and IADLs. Baseline:  Goal status: partially met     LONG TERM GOALS: Target date:  12/15/21   Pt will demonstrate improved strength in BUE to open refrigerator door with increased ease and no increase in pain. Baseline: R: 36 and L: 24 Goal status: on-going   2.  Pt will be able to demonstrate safe strategy to pick items up from the floor with/without use of AE as appropriate. Baseline:  Goal status: on-going   3.  Pt will demonstrate improved ROM in BUE to allow for increased ease with obtaining items from shelves in cabinets and refrigerator. Baseline: Shoulder flexion L:135 and R: 140 Goal status: on-going   4.  Pt will demonstrate improved ability to complete sit > stand from various height surfaces to increase independence with functional transfers without cues or assistance. Baseline:  Goal status: on-going     ASSESSMENT:   CLINICAL IMPRESSION: Pt required increased rest breaks this session between each set of 10.  Pt continues to  require mod multimodal cues for proper hand placement and technique of each exercise, despite education and handouts provided previously.  Pt with little carryover of previous education.  Pt reports that she did not complete any of her theraband exercises since last visit.  Pt demonstrating difficulty recalling any specifics of education with energy conservation without question cues and providing examples.  Therapist educated on importance of mobility, engaging in HEP, and modifications of routines to increase endurance, independence, and safety.   PERFORMANCE DEFICITS in functional skills including ADLs, IADLs, dexterity, ROM, strength, pain, flexibility, GMC, mobility, balance, body mechanics, endurance, cardiopulmonary status limiting function, decreased knowledge of precautions, decreased knowledge  of use of DME, and UE functional use and psychosocial skills including environmental adaptation.    IMPAIRMENTS are limiting patient from ADLs and IADLs.    COMORBIDITIES may have co-morbidities  that affects occupational performance. Patient will benefit from skilled OT to address above impairments and improve overall function.   MODIFICATION OR ASSISTANCE TO COMPLETE EVALUATION: Min-Moderate modification of tasks or assist with assess necessary to complete an evaluation.   OT OCCUPATIONAL PROFILE AND HISTORY: Detailed assessment: Review of records and additional review of physical, cognitive, psychosocial history related to current functional performance.   CLINICAL DECISION MAKING: Moderate - several treatment options, min-mod task modification necessary   REHAB POTENTIAL: Fair    EVALUATION COMPLEXITY: Moderate       PLAN: OT FREQUENCY: 2x/week   OT DURATION: 6 weeks   PLANNED INTERVENTIONS: self care/ADL training, therapeutic exercise, therapeutic activity, neuromuscular re-education, manual therapy, passive range of motion, balance training, functional mobility training, patient/family  education, psychosocial skills training, energy conservation, coping strategies training, and DME and/or AE instructions   RECOMMENDED OTHER SERVICES: NA   CONSULTED AND AGREED WITH PLAN OF CARE: Patient and family member/caregiver   PLAN FOR NEXT SESSION: Review theraband and modified yoga HEP; theraputty for hand strengthening possible use of grip strengthener   [place], [place], OTR/L 11/23/2021, 4:09 PM

## 2021-11-23 NOTE — Therapy (Signed)
[facility] [address] [place], [place], [address] Phone: [phone]   Fax:  [phone]  Physical Therapy Treatment  Patient Details  Name: Stephanie Cortez MRN: [id number] Date of Birth: 1933/12/11 Referring Provider (PT): Brand Males, MD  Rationale for Evaluation and Treatment Rehabilitation  Encounter Date: 11/23/2021   PT End of Session - 11/23/21 1449     Visit Number 7    Number of Visits 16    Date for PT Re-Evaluation 12/21/21    Authorization Type Medicare A and B/ U[employer] Supplemental 2023    Progress Note Due on Visit 10    PT Start Time 4097    PT Stop Time 3532    PT Time Calculation (min) 45 min    Activity Tolerance Patient tolerated treatment well    Behavior During Therapy [facility] for tasks assessed/performed             Past Medical History:  Diagnosis Date   Anemia    - Hgb 9.7gm% on 07/13/2008 in [place] 129gm% wiht normal irone levsl and ferritin 10/27/2008 in [place] Recurrent otitis/sinusitis   Anxiety    chronic BZ prn   Atrial fibrillation ([place])    chronic anticoag   Bronchiectasis    >PFT 07/13/2008 in [place] 1.9L/76%, FVC 2.45L/74, Ratio 79, TLC 121%, DLCO 64%  AE BRonchiectasis - Dec 2010.New Rx:  outpatient - Feb 2011 - Rx outpatient   CHF (congestive heart failure) ([place])    COPD (chronic obstructive pulmonary disease) (HCC)    bronchiectasis   Cricopharyngeal achalasia    Depression    Diverticulosis    Dyslipidemia    Eczema    Fatty liver    GERD with stricture    Glaucoma    H. 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[place])    Ventral hernia    Vertigo    Wears glasses     Past Surgical History:  Procedure Laterality Date   BOWEL RESECTION N/A 02/08/2020   Procedure: SMALL BOWEL RESECTION;  Surgeon:  Donnie Mesa, MD;  Location: [place];  Service: General;  Laterality: N/A;   BREAST SURGERY     br bx   CARDIAC CATHETERIZATION  07/01/2013   CATARACT EXTRACTION     both   COLONOSCOPY     ESOPHAGOSCOPY W/ BOTOX INJECTION  12/11/2011   Procedure: ESOPHAGOSCOPY WITH BOTOX INJECTION;  Surgeon: Rozetta Nunnery, MD;  Location: [place];  Service: ENT;  Laterality: N/A;  esophogoscopy with dilation, botox injection   FOOT SURGERY  03/14/2011   gastroc slide-rt   GASTROSTOMY N/A 02/08/2020   Procedure: INSERTION OF GASTROSTOMY TUBE;  Surgeon: Donnie Mesa, MD;  Location: [place];  Service: General;  Laterality: N/A;   HIP ARTHROPLASTY Right 10/28/2019   Procedure: ARTHROPLASTY BIPOLAR HIP (HEMIARTHROPLASTY);  Surgeon: Marybelle Killings, MD;  Location: [facility];  Service: Orthopedics;  Laterality: Right;   LAPAROTOMY N/A 02/08/2020   Procedure: EXPLORATORY LAPAROTOMY;  Surgeon: Donnie Mesa, MD;  Location: [place];  Service: General;  Laterality: N/A;   LYSIS OF ADHESION N/A 02/08/2020   Procedure: LYSIS OF ADHESIONS;  Surgeon: Donnie Mesa, MD;  Location: [place];  Service: General;  Laterality: N/A;  TOTAL ABDOMINAL HYSTERECTOMY     TOTAL HIP REVISION Right 11/16/2019   Procedure: TOTAL HIP REVISION BIPOLAR TO CEMENTED BIPOLAR;  Surgeon: Marybelle Killings, MD;  Location: [place];  Service: Orthopedics;  Laterality: Right;   WISDOM TOOTH EXTRACTION      There were no vitals filed for this visit.   Subjective Assessment - 11/23/21 1452     Subjective feeling better this week.    Patient is accompained by: Family member   daughter   Pertinent History right hip fx s/p THA and required revision, COPD, A-fib, CHF    Limitations Lifting;Standing;Walking;House hold activities    How long can you sit comfortably? 15-30 min    How long can you stand comfortably? varies    Patient Stated Goals decrease right hip pain, improve strength/balance/walking    Currently in Pain? No/denies    Pain  Score 0-No pain    Pain Onset More than a month ago                [facility] PT Assessment - 11/23/21 0001       AROM   Right Hip Flexion 70    Right Knee Flexion 92      Transfers   Five time sit to stand comments  59.44   from 18" arm chair with dependence on bilateral arm rests and compensatory movements due to lack of right hip flexion                               Balance Exercises - 11/23/21 0001       Balance Exercises: Standing   Standing Eyes Opened Narrow base of support (BOS);Head turns;Foam/compliant surface;4 reps;30 secs    Standing Eyes Closed Narrow base of support (BOS);Head turns;Foam/compliant surface;4 reps;30 secs    Standing, One Foot on a Step Eyes open;8 inch;3 reps;30 secs   1 rep with RLE on step, 3 with LLE   Retro Gait Other (comment)   parallel bars x 2 min   Sidestepping Other (comment)   x 2 min with parallel bar                 PT Short Term Goals - 11/23/21 1522       PT SHORT TERM GOAL #1   Title Patient will be independent in HEP to improve functional outcomes    Baseline poor recall    Time 4    Period Weeks    Status On-going    Target Date 11/23/21      PT SHORT TERM GOAL #2   Title Demonstrate right hip flexion to 90 degrees and right knee flexion to 100 degrees to improve body mechanics for transfers    Baseline 70 degrees hip flexion, 92 right knee flexion-- no change    Time 4    Period Weeks    Status On-going    Target Date 11/23/21      PT SHORT TERM GOAL #3   Title Manifest improved BLE strength per time of 30 sec 5xSTS test    Baseline 38.56" from elevated EOM; 59.44 sec 18" seat dependent on BUE support from arm rests    Time 4    Period Weeks    Status On-going    Target Date 11/23/21               PT Long Term Goals - 10/26/21 1706  PT LONG TERM GOAL #1   Title Demo reduced risk for falls per TUG test time 20 sec    Baseline 59.03 sec    Time 8    Period Weeks     Status New    Target Date 12/21/21      PT LONG TERM GOAL #2   Title Manifest improved BLE strength per time 25 sec 5xSTS    Time 8    Period Weeks    Status New    Target Date 12/21/21                   Plan - 11/23/21 1526     Clinical Impression Statement Progressing with static balance activities on compliant surface with improved postural stability and awareness of limits of stability with activities requiring closed eyes to facilitate improved capabilities. Limited progress in right hip flexion ROM with firm/empty end-feel to flexion approx 60-70 degrees which greatly limits mobility as she is unable to perform sit to stand unless extensive compensatory strategies are enacted. Reduced ability to perform trunk flexion over BOS limiting push-off requiring dependence on BUE. Continued sessions to provide for relevant modifications to improve activity tolerance and safety with mobility at home environment and hopefully facilitate more liberal joint mobility    Personal Factors and Comorbidities Age;Comorbidity 3+;Time since onset of injury/illness/exacerbation    Comorbidities right LE orthopedics, COPD, cardiac conditions    Examination-Activity Limitations Carry;Dressing;Lift;Toileting;Stand;Stairs;Squat;Locomotion Level;Transfers    Examination-Participation Restrictions Cleaning;Community Activity;Driving;Yard Work;Occupation;Meal Prep    Stability/Clinical Decision Making Evolving/Moderate complexity    Rehab Potential Fair    PT Frequency 2x / week    PT Duration 8 weeks    PT Treatment/Interventions ADLs/Self Care Home Management;Aquatic Therapy;Electrical Stimulation;DME Instruction;Gait training;Stair training;Functional mobility training;Therapeutic activities;Therapeutic exercise;Balance training;Neuromuscular re-education;Manual techniques;Passive range of motion;Dry needling;Energy conservation;Joint Manipulations;Vestibular;Taping;Patient/family education    PT  Next Visit Plan AAROM, PROM in supine    PT Home Exercise Plan [id number] seated knee flexion AAROM, hip add squeeze, standing with LLE elevated on 6" step, sidestepping, heel slides with strap    Consulted and Agree with Plan of Care Patient;Family member/caregiver    Family Member Consulted daughter             Patient will benefit from skilled therapeutic intervention in order to improve the following deficits and impairments:  Abnormal gait, Decreased activity tolerance, Decreased balance, Decreased mobility, Decreased range of motion, Decreased strength, Difficulty walking, Impaired flexibility, Improper body mechanics, Pain  Visit Diagnosis: Muscle weakness (generalized)  Unsteadiness on feet  Difficulty in walking, not elsewhere classified  Other abnormalities of gait and mobility     Problem List Patient Active Problem List   Diagnosis Date Noted   Palliative care encounter 07/27/2021   Constipation 07/27/2021   Cricopharyngeal achalasia 06/24/2021   Disorder of cervical esophageal region 06/23/2021   Pressure injury of skin 06/21/2021   Physical deconditioning 06/21/2021   SOB (shortness of breath) 06/18/2021   Acute on chronic respiratory failure with hypoxia (HCC) 06/18/2021   Chronic diastolic CHF (congestive heart failure) ([place]) 06/18/2021   Chronic anemia 06/18/2021   COVID-19 virus infection 06/17/2021   Chronic pain of right knee 06/20/2020   Abnormality of gait 06/20/2020   Malnutrition of moderate degree 02/25/2020   S/P percutaneous endoscopic gastrostomy (PEG) tube placement (HCC)    Post-operative pain    Subtherapeutic international normalized ratio (INR)    Debility 02/17/2020   Mesenteric ischemia ([place])    Sepsis ([place]) 02/08/2020  Small bowel ischemia (HCC) 02/08/2020   Thrombocytosis 02/08/2020   Abnormal CT of the abdomen    Periprosthetic fracture around internal prosthetic hip joint 11/16/2019   Periprosthetic fracture around internal  prosthetic hip joint, initial encounter    Chronic obstructive pulmonary disease (HCC)    Essential hypertension    Chronic anticoagulation    Acute blood loss anemia    Transaminitis    Hypoalbuminemia due to protein-calorie malnutrition (HCC)    Labile blood pressure    Drug induced constipation    Postoperative pain    Subcapital fracture of femur ([place]) 10/30/2019   Post-operative state    Prerenal azotemia    Leukocytosis    Closed right hip fracture, initial encounter ([place]) 10/27/2019   Hoarseness 02/19/2018   Sensorineural hearing loss (SNHL) of both ears 02/19/2018   Tinnitus, bilateral 02/19/2018   Cough 09/06/2017   Cough variant asthma 08/19/2017   Acute bronchitis 02/20/2016   Pulmonary air trapping 01/04/2016   Encounter for preoperative pulmonary examination 01/04/2016   NSIP (nonspecific interstitial pneumonia) ([place]) 09/26/2015   Chronic sinusitis 09/26/2015   ILD (interstitial lung disease) ([place]) 07/12/2014   Upper airway cough syndrome 09/23/2013   Encounter for therapeutic drug monitoring 08/11/2013   Congestive heart failure ([place]) 07/08/2013   Coronary artery calcification seen on CAT scan 03/15/2013   Nodule of left lung 03/15/2013   Orthostatic lightheadedness 02/09/2013   Depression    Diverticulitis 01/01/2012   GERD (gastroesophageal reflux disease) 01/01/2012   Hypothyroid 11/23/2010   Edema 10/04/2010   Bronchiectasis ([place])    HYPERTENSION, BENIGN 10/22/2008   A-fib ([place])     Toniann Fail, PT 11/23/2021, 3:33 PM  [facility] [address], [place], [place], [address] Phone: [phone]   Fax:  [phone]  Name: Stephanie Cortez MRN: [id number] Date of Birth: [birth year]

## 2021-11-28 DIAGNOSIS — J849 Interstitial pulmonary disease, unspecified: Secondary | ICD-10-CM | POA: Diagnosis not present

## 2021-11-29 ENCOUNTER — Ambulatory Visit: Payer: Medicare Other | Admitting: Occupational Therapy

## 2021-11-29 ENCOUNTER — Encounter: Payer: Self-pay | Admitting: Occupational Therapy

## 2021-11-29 ENCOUNTER — Ambulatory Visit: Payer: Medicare Other

## 2021-11-29 DIAGNOSIS — R262 Difficulty in walking, not elsewhere classified: Secondary | ICD-10-CM | POA: Diagnosis not present

## 2021-11-29 DIAGNOSIS — R2681 Unsteadiness on feet: Secondary | ICD-10-CM

## 2021-11-29 DIAGNOSIS — M6281 Muscle weakness (generalized): Secondary | ICD-10-CM | POA: Diagnosis not present

## 2021-11-29 DIAGNOSIS — R2689 Other abnormalities of gait and mobility: Secondary | ICD-10-CM

## 2021-11-29 NOTE — Therapy (Signed)
[facility] [address] [place], [place], [address] Phone: [phone]   Fax:  [phone]  Physical Therapy Treatment  Patient Details  Name: Stephanie Cortez MRN: [id number] Date of Birth: 12/23/1933 Referring Provider (PT): Brand Males, MD   Encounter Date: 11/29/2021   PT End of Session - 11/29/21 0811     Visit Number 8    Number of Visits 16    Date for PT Re-Evaluation 12/21/21    Authorization Type Medicare A and B/ UHC Supplemental 2023    Progress Note Due on Visit 10    PT Start Time 0810   late arrival   PT Stop Time 0845    PT Time Calculation (min) 35 min    Activity Tolerance Patient tolerated treatment well    Behavior During Therapy [facility] for tasks assessed/performed             Past Medical History:  Diagnosis Date   Anemia    - Hgb 9.7gm% on 07/13/2008 in [place] 129gm% wiht normal irone levsl and ferritin 10/27/2008 in [place] Recurrent otitis/sinusitis   Anxiety    chronic BZ prn   Atrial fibrillation ([place])    chronic anticoag   Bronchiectasis    >PFT 07/13/2008 in [place] 1.9L/76%, FVC 2.45L/74, Ratio 79, TLC 121%, DLCO 64%  AE BRonchiectasis - Dec 2010.New Rx:  outpatient - Feb 2011 - Rx outpatient   CHF (congestive heart failure) ([place])    COPD (chronic obstructive pulmonary disease) (HCC)    bronchiectasis   Cricopharyngeal achalasia    Depression    Diverticulosis    Dyslipidemia    Eczema    Fatty liver    GERD with stricture    Glaucoma    H. 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[place])    Ventral hernia    Vertigo    Wears glasses     Past Surgical History:  Procedure Laterality Date   BOWEL RESECTION N/A 02/08/2020   Procedure: SMALL BOWEL RESECTION;  Surgeon: Donnie Mesa, MD;  Location: [place];   Service: General;  Laterality: N/A;   BREAST SURGERY     br bx   CARDIAC CATHETERIZATION  07/01/2013   CATARACT EXTRACTION     both   COLONOSCOPY     ESOPHAGOSCOPY W/ BOTOX INJECTION  12/11/2011   Procedure: ESOPHAGOSCOPY WITH BOTOX INJECTION;  Surgeon: Rozetta Nunnery, MD;  Location: [place];  Service: ENT;  Laterality: N/A;  esophogoscopy with dilation, botox injection   FOOT SURGERY  03/14/2011   gastroc slide-rt   GASTROSTOMY N/A 02/08/2020   Procedure: INSERTION OF GASTROSTOMY TUBE;  Surgeon: Donnie Mesa, MD;  Location: [place];  Service: General;  Laterality: N/A;   HIP ARTHROPLASTY Right 10/28/2019   Procedure: ARTHROPLASTY BIPOLAR HIP (HEMIARTHROPLASTY);  Surgeon: Marybelle Killings, MD;  Location[facility];  Service: Orthopedics;  Laterality: Right;   LAPAROTOMY N/A 02/08/2020   Procedure: EXPLORATORY LAPAROTOMY;  Surgeon: Donnie Mesa, MD;  Location: [place];  Service: General;  Laterality: N/A;   LYSIS OF ADHESION N/A 02/08/2020   Procedure: LYSIS OF ADHESIONS;  Surgeon: Donnie Mesa, MD;  Location: [place];  Service: General;  Laterality: N/A;   TOTAL  ABDOMINAL HYSTERECTOMY     TOTAL HIP REVISION Right 11/16/2019   Procedure: TOTAL HIP REVISION BIPOLAR TO CEMENTED BIPOLAR;  Surgeon: Marybelle Killings, MD;  Location: [place];  Service: Orthopedics;  Laterality: Right;   WISDOM TOOTH EXTRACTION      There were no vitals filed for this visit.   Subjective Assessment - 11/29/21 0812     Subjective Having congestion from ILD and feeling kind of weak    Patient is accompained by: Family member   daughter   Pertinent History right hip fx s/p THA and required revision, COPD, A-fib, CHF    Limitations Lifting;Standing;Walking;House hold activities    How long can you sit comfortably? 15-30 min    How long can you stand comfortably? varies    Patient Stated Goals decrease right hip pain, improve strength/balance/walking    Pain Onset More than a month ago                                     Balance Exercises - 11/29/21 0001       Balance Exercises: Standing   Standing Eyes Opened Wide (BOA);2 reps;30 secs;Narrow base of support (BOS)    Standing Eyes Closed Narrow base of support (BOS);Wide (BOA);Foam/compliant surface;2 reps;30 secs    Standing, One Foot on a Step Eyes open;Trunk rotation;8 inch;30 secs;Other reps (comment)   7 reps. To provide right hip flexor stretch and weight acceptance. Cues for glute max activation to further stretch   Retro Gait 1 rep   rep   Sidestepping 1 rep   2 min with rails   Heel Raises Both;20 reps   on slope of parallel bar platform   Toe Raise Both;20 reps   on slope of parallel bar platform                 PT Short Term Goals - 11/23/21 1522       PT SHORT TERM GOAL #1   Title Patient will be independent in HEP to improve functional outcomes    Baseline poor recall    Time 4    Period Weeks    Status On-going    Target Date 11/23/21      PT SHORT TERM GOAL #2   Title Demonstrate right hip flexion to 90 degrees and right knee flexion to 100 degrees to improve body mechanics for transfers    Baseline 70 degrees hip flexion, 92 right knee flexion-- no change    Time 4    Period Weeks    Status On-going    Target Date 11/23/21      PT SHORT TERM GOAL #3   Title Manifest improved BLE strength per time of 30 sec 5xSTS test    Baseline 38.56" from elevated EOM; 59.44 sec 18" seat dependent on BUE support from arm rests    Time 4    Period Weeks    Status On-going    Target Date 11/23/21               PT Long Term Goals - 10/26/21 1706       PT LONG TERM GOAL #1   Title Demo reduced risk for falls per TUG test time 20 sec    Baseline 59.03 sec    Time 8    Period Weeks    Status New    Target Date 12/21/21  PT LONG TERM GOAL #2   Title Manifest improved BLE strength per time 25 sec 5xSTS    Time 8    Period Weeks    Status New    Target Date 12/21/21                    Plan - 11/29/21 0846     Clinical Impression Statement Difficulty in maintaining static standing on compliant surface requiring frequent UE suppor for balance correction. Emphasis on taking large amplitude movements to encourage and improve RLE hip ROM during mobility with emphasis on extension/abduction to improve righting reactions and reduce risk for falls. Tolerating forced weight bearing to RLE fairly well with frequent cues to promote right hip extension for stretch of soft tissues as well as promote upright posture to combat against right hip fleixon contraction and cues for glute max engagement to address this as well. Continued sessions indicated to progress carryover of mobility and balance activities to improve dynamic balance and safety with mobility to reduce risk for falls    Personal Factors and Comorbidities Age;Comorbidity 3+;Time since onset of injury/illness/exacerbation    Comorbidities right LE orthopedics, COPD, cardiac conditions    Examination-Activity Limitations Carry;Dressing;Lift;Toileting;Stand;Stairs;Squat;Locomotion Level;Transfers    Examination-Participation Restrictions Cleaning;Community Activity;Driving;Yard Work;Occupation;Meal Prep    Stability/Clinical Decision Making Evolving/Moderate complexity    Rehab Potential Fair    PT Frequency 2x / week    PT Duration 8 weeks    PT Treatment/Interventions ADLs/Self Care Home Management;Aquatic Therapy;Electrical Stimulation;DME Instruction;Gait training;Stair training;Functional mobility training;Therapeutic activities;Therapeutic exercise;Balance training;Neuromuscular re-education;Manual techniques;Passive range of motion;Dry needling;Energy conservation;Joint Manipulations;Vestibular;Taping;Patient/family education    PT Next Visit Plan AAROM, PROM in supine    PT Home Exercise Plan [id number] seated knee flexion AAROM, hip add squeeze, standing with LLE elevated on 6" step, sidestepping,  heel slides with strap    Consulted and Agree with Plan of Care Patient;Family member/caregiver    Family Member Consulted daughter             Patient will benefit from skilled therapeutic intervention in order to improve the following deficits and impairments:  Abnormal gait, Decreased activity tolerance, Decreased balance, Decreased mobility, Decreased range of motion, Decreased strength, Difficulty walking, Impaired flexibility, Improper body mechanics, Pain  Visit Diagnosis: Muscle weakness (generalized)  Unsteadiness on feet  Difficulty in walking, not elsewhere classified  Other abnormalities of gait and mobility     Problem List Patient Active Problem List   Diagnosis Date Noted   Palliative care encounter 07/27/2021   Constipation 07/27/2021   Cricopharyngeal achalasia 06/24/2021   Disorder of cervical esophageal region 06/23/2021   Pressure injury of skin 06/21/2021   Physical deconditioning 06/21/2021   SOB (shortness of breath) 06/18/2021   Acute on chronic respiratory failure with hypoxia (HCC) 06/18/2021   Chronic diastolic CHF (congestive heart failure) ([place]) 06/18/2021   Chronic anemia 06/18/2021   COVID-19 virus infection 06/17/2021   Chronic pain of right knee 06/20/2020   Abnormality of gait 06/20/2020   Malnutrition of moderate degree 02/25/2020   S/P percutaneous endoscopic gastrostomy (PEG) tube placement (HCC)    Post-operative pain    Subtherapeutic international normalized ratio (INR)    Debility 02/17/2020   Mesenteric ischemia (HCC)    Sepsis ([place]) 02/08/2020   Small bowel ischemia ([place]) 02/08/2020   Thrombocytosis 02/08/2020   Abnormal CT of the abdomen    Periprosthetic fracture around internal prosthetic hip joint 11/16/2019   Periprosthetic fracture around internal prosthetic hip joint, initial encounter  Chronic obstructive pulmonary disease (HCC)    Essential hypertension    Chronic anticoagulation    Acute blood loss anemia     Transaminitis    Hypoalbuminemia due to protein-calorie malnutrition (HCC)    Labile blood pressure    Drug induced constipation    Postoperative pain    Subcapital fracture of femur ([place]) 10/30/2019   Post-operative state    Prerenal azotemia    Leukocytosis    Closed right hip fracture, initial encounter ([place]) 10/27/2019   Hoarseness 02/19/2018   Sensorineural hearing loss (SNHL) of both ears 02/19/2018   Tinnitus, bilateral 02/19/2018   Cough 09/06/2017   Cough variant asthma 08/19/2017   Acute bronchitis 02/20/2016   Pulmonary air trapping 01/04/2016   Encounter for preoperative pulmonary examination 01/04/2016   NSIP (nonspecific interstitial pneumonia) ([place]) 09/26/2015   Chronic sinusitis 09/26/2015   ILD (interstitial lung disease) ([place]) 07/12/2014   Upper airway cough syndrome 09/23/2013   Encounter for therapeutic drug monitoring 08/11/2013   Congestive heart failure ([place]) 07/08/2013   Coronary artery calcification seen on CAT scan 03/15/2013   Nodule of left lung 03/15/2013   Orthostatic lightheadedness 02/09/2013   Depression    Diverticulitis 01/01/2012   GERD (gastroesophageal reflux disease) 01/01/2012   Hypothyroid 11/23/2010   Edema 10/04/2010   Bronchiectasis ([place])    HYPERTENSION, BENIGN 10/22/2008   A-fib ([place])     Toniann Fail, PT 11/29/2021, 8:48 AM  [facility] [address] [place], [place], [address] Phone: [phone]   Fax:  [phone]  Name: Stephanie Cortez MRN: [id number] Date of Birth: 1933-11-17

## 2021-11-29 NOTE — Therapy (Signed)
OUTPATIENT OCCUPATIONAL THERAPY TREATMENT NOTE   Patient Name: Stephanie Cortez MRN: [id number] DOB:1934/04/01, 88 y.o., female 19 Date: 11/29/2021  PCP: [name], MD  REFERRING PROVIDER: Brand Males, MD   END OF SESSION:   OT End of Session - 11/29/21 0855     Visit Number 6    Number of Visits 13    Date for OT Re-Evaluation 12/15/21    Authorization Type Medicare A & B    Authorization - Visit Number 6    Progress Note Due on Visit 10    OT Start Time 0848    OT Stop Time 0930    OT Time Calculation (min) 42 min    Activity Tolerance Patient tolerated treatment well    Behavior During Therapy [facility] for tasks assessed/performed            Past Medical History:  Diagnosis Date   Anemia    - Hgb 9.7gm% on 07/13/2008 in [place] 129gm% wiht normal irone levsl and ferritin 10/27/2008 in [place] Recurrent otitis/sinusitis   Anxiety    chronic BZ prn   Atrial fibrillation ([place])    chronic anticoag   Bronchiectasis    >PFT 07/13/2008 in [place] 1.9L/76%, FVC 2.45L/74, Ratio 79, TLC 121%, DLCO 64%  AE BRonchiectasis - Dec 2010.New Rx:  outpatient - Feb 2011 - Rx outpatient   CHF (congestive heart failure) ([place])    COPD (chronic obstructive pulmonary disease) (HCC)    bronchiectasis   Cricopharyngeal achalasia    Depression    Diverticulosis    Dyslipidemia    Eczema    Fatty liver    GERD with stricture    Glaucoma    H. 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[place])    Ventral hernia    Vertigo    Wears glasses    Past Surgical History:  Procedure Laterality Date   BOWEL RESECTION N/A 02/08/2020   Procedure: SMALL BOWEL RESECTION;  Surgeon: Donnie Mesa, MD;  Location: [place];  Service: General;  Laterality: N/A;   BREAST SURGERY     br bx   CARDIAC CATHETERIZATION  07/01/2013    CATARACT EXTRACTION     both   COLONOSCOPY     ESOPHAGOSCOPY W/ BOTOX INJECTION  12/11/2011   Procedure: ESOPHAGOSCOPY WITH BOTOX INJECTION;  Surgeon: Rozetta Nunnery, MD;  Location: [place];  Service: ENT;  Laterality: N/A;  esophogoscopy with dilation, botox injection   FOOT SURGERY  03/14/2011   gastroc slide-rt   GASTROSTOMY N/A 02/08/2020   Procedure: INSERTION OF GASTROSTOMY TUBE;  Surgeon: Donnie Mesa, MD;  Location: [place];  Service: General;  Laterality: N/A;   HIP ARTHROPLASTY Right 10/28/2019   Procedure: ARTHROPLASTY BIPOLAR HIP (HEMIARTHROPLASTY);  Surgeon: Marybelle Killings, MD;  [facility];  Service: Orthopedics;  Laterality: Right;   LAPAROTOMY N/A 02/08/2020   Procedure: EXPLORATORY LAPAROTOMY;  Surgeon: Donnie Mesa, MD;  Location: [place];  Service: General;  Laterality: N/A;   LYSIS OF ADHESION N/A 02/08/2020   Procedure: LYSIS OF ADHESIONS;  Surgeon: Donnie Mesa, MD;  Location: [place];  Service: General;  Laterality: N/A;   TOTAL ABDOMINAL HYSTERECTOMY     TOTAL HIP REVISION Right 11/16/2019   Procedure: TOTAL  HIP REVISION BIPOLAR TO CEMENTED BIPOLAR;  Surgeon: Marybelle Killings, MD;  Location: [place];  Service: Orthopedics;  Laterality: Right;   WISDOM TOOTH EXTRACTION     Patient Active Problem List   Diagnosis Date Noted   Palliative care encounter 07/27/2021   Constipation 07/27/2021   Cricopharyngeal achalasia 06/24/2021   Disorder of cervical esophageal region 06/23/2021   Pressure injury of skin 06/21/2021   Physical deconditioning 06/21/2021   SOB (shortness of breath) 06/18/2021   Acute on chronic respiratory failure with hypoxia (HCC) 06/18/2021   Chronic diastolic CHF (congestive heart failure) ([place]) 06/18/2021   Chronic anemia 06/18/2021   COVID-19 virus infection 06/17/2021   Chronic pain of right knee 06/20/2020   Abnormality of gait 06/20/2020   Malnutrition of moderate degree 02/25/2020   S/P percutaneous endoscopic gastrostomy  (PEG) tube placement (HCC)    Post-operative pain    Subtherapeutic international normalized ratio (INR)    Debility 02/17/2020   Mesenteric ischemia (HCC)    Sepsis ([place]) 02/08/2020   Small bowel ischemia ([place]) 02/08/2020   Thrombocytosis 02/08/2020   Abnormal CT of the abdomen    Periprosthetic fracture around internal prosthetic hip joint 11/16/2019   Periprosthetic fracture around internal prosthetic hip joint, initial encounter    Chronic obstructive pulmonary disease (HCC)    Essential hypertension    Chronic anticoagulation    Acute blood loss anemia    Transaminitis    Hypoalbuminemia due to protein-calorie malnutrition (HCC)    Labile blood pressure    Drug induced constipation    Postoperative pain    Subcapital fracture of femur ([place]) 10/30/2019   Post-operative state    Prerenal azotemia    Leukocytosis    Closed right hip fracture, initial encounter ([place]) 10/27/2019   Hoarseness 02/19/2018   Sensorineural hearing loss (SNHL) of both ears 02/19/2018   Tinnitus, bilateral 02/19/2018   Cough 09/06/2017   Cough variant asthma 08/19/2017   Acute bronchitis 02/20/2016   Pulmonary air trapping 01/04/2016   Encounter for preoperative pulmonary examination 01/04/2016   NSIP (nonspecific interstitial pneumonia) ([place]) 09/26/2015   Chronic sinusitis 09/26/2015   ILD (interstitial lung disease) ([place]) 07/12/2014   Upper airway cough syndrome 09/23/2013   Encounter for therapeutic drug monitoring 08/11/2013   Congestive heart failure ([place]) 07/08/2013   Coronary artery calcification seen on CAT scan 03/15/2013   Nodule of left lung 03/15/2013   Orthostatic lightheadedness 02/09/2013   Depression    Diverticulitis 01/01/2012   GERD (gastroesophageal reflux disease) 01/01/2012   Hypothyroid 11/23/2010   Edema 10/04/2010   Bronchiectasis ([place])    HYPERTENSION, BENIGN 10/22/2008   A-fib ([place])     ONSET DATE: 10/06/2021 (referral date)   REFERRING DIAG: R53.81  (ICD-10-CM) - Other malaise J84.9 (ICD-10-CM) - Interstitial pulmonary disease, unspecified   THERAPY DIAG:  Muscle weakness (generalized)  Unsteadiness on feet  PERTINENT HISTORY: R hip fx s/p THA and required revision, COPD, A-fib, CHF   PRECAUTIONS: Fall  SUBJECTIVE:   SUBJECTIVE STATEMENT: Pt reports it is very hard for her to incorporate energy conservation strategies into daily activities because when she starts something she "has to finish it"  PAIN: Are you having pain? No - reports R hip pain only w/ movement/mobility  OBJECTIVE:   TODAY'S TREATMENT: Ambulated in/out of session at [facility] w/ rollator; SPV w/ sit-to-stand transitions Reviewed theraband UE HEP for BUE strengthening and generalized endurance. Therapist providing intermittent demonstration cues, intermittent tactile cues, and mod verbal cues for recall of proper  positioning and technique. Pt frequently utilizing compensatory strategies, requiring cues to focus on quality of movement. OT reiterated benefit of incorporating exercises into weekly routine if unable to complete exercises daily. - Seated Shoulder Horizontal Abduction with Resistance  - Seated Shoulder Flexion with Self-Anchored Resistance - Seated Elbow Flexion with Self-Anchored Resistance - Seated Diagonal Reach with Resistance   PATIENT EDUCATION: Education details: Reviewed energy conservation strategies, attempting to problem-solve w/ pt to identify pt-specific examples of how she is able to incorporate principles into daily activities; also discussed benefit and purpose of OT recommending strategies w/ pt verbalizing understanding Person educated: Patient Education method: Explanation Education comprehension: verbalized understanding     HOME EXERCISE PROGRAM: Access Code: [email or web address]     GOALS: Goals reviewed with patient? Yes   SHORT TERM GOALS: Target date: 11/24/2021   Pt will be independent in HEP to address BUE strengthening and  conditioning to increase independence with ADLs and IADLs Baseline: Goal status: partially met   2.  Pt will verbalize energy conservation strategies and provide 2 examples that she is incorporating to increase her safety/independence with ADLs and IADLs. Baseline:  Goal status: partially met     LONG TERM GOALS: Target date:  12/15/21   Pt will demonstrate improved strength in BUE to open refrigerator door with increased ease and no increase in pain. Baseline: R: 36 and L: 24 Goal status: on-going   2.  Pt will be able to demonstrate safe strategy to pick items up from the floor with/without use of AE as appropriate. Baseline:  Goal status: on-going   3.  Pt will demonstrate improved ROM in BUE to allow for increased ease with obtaining items from shelves in cabinets and refrigerator. Baseline: Shoulder flexion L:135 and R: 140 Goal status: on-going   4.  Pt will demonstrate improved ability to complete sit > stand from various height surfaces to increase independence with functional transfers without cues or assistance. Baseline:  Goal status: on-going     ASSESSMENT:   CLINICAL IMPRESSION: OT initiated session inquiring about success w/ carryover of recommended theraband HEP w/ pt denying consistent carryover and unable to recall exercises during brief review. Due to this, OT incorporated exercises into session, reiterating hand placement, decreased compensatory movements, and appropriate positioning and alignment during exercises. Pt required frequent rest breaks and benefited from incorporation of timers for rest breaks and encouragement. Also considering pt's report of difficulty incorporating energy conservation strategies into daily activities, OT facilitated problem-solving w/ pt, encouraging her to review and incorporate overall principles. OT reviewed these strategies during rest breaks as able and continued education on importance of mobility, engaging in HEP, and  modifications of routines to increase endurance, independence, and safety.   PERFORMANCE DEFICITS in functional skills including ADLs, IADLs, dexterity, ROM, strength, pain, flexibility, GMC, mobility, balance, body mechanics, endurance, cardiopulmonary status limiting function, decreased knowledge of precautions, decreased knowledge of use of DME, and UE functional use and psychosocial skills including environmental adaptation.    IMPAIRMENTS are limiting patient from ADLs and IADLs.    COMORBIDITIES may have co-morbidities  that affects occupational performance. Patient will benefit from skilled OT to address above impairments and improve overall function.      PLAN: OT FREQUENCY: 2x/week   OT DURATION: 6 weeks   PLANNED INTERVENTIONS: self care/ADL training, therapeutic exercise, therapeutic activity, neuromuscular re-education, manual therapy, passive range of motion, balance training, functional mobility training, patient/family education, psychosocial skills training, energy conservation, coping strategies training, and DME and/or AE  instructions   RECOMMENDED OTHER SERVICES: NA   CONSULTED AND AGREED WITH PLAN OF CARE: Patient and family member/caregiver   PLAN FOR NEXT SESSION: Review modified yoga HEP; theraputty for hand strengthening possible use of grip [place], MSOT, OTR/L 11/29/2021, 4:56 PM

## 2021-11-30 ENCOUNTER — Telehealth: Payer: Self-pay | Admitting: Internal Medicine

## 2021-11-30 NOTE — Telephone Encounter (Signed)
Called and spoke with patient's daughter Stephanie Cortez who states that the pharmacy told her that they needed an alternative for her Symbicort inhaler. Asked her if they told her they told her it was no longer covered by insurance or what the case was. She said she really wasn't sure. Advised her to call the insurance and ask them about it and if it needs to be changed then to call us back and let us know. She expressed understanding. Nothing further needed at this time.

## 2021-12-01 ENCOUNTER — Ambulatory Visit: Payer: Medicare Other

## 2021-12-02 ENCOUNTER — Other Ambulatory Visit (HOSPITAL_COMMUNITY): Payer: Self-pay

## 2021-12-02 ENCOUNTER — Telehealth: Payer: Self-pay | Admitting: Pharmacy Technician

## 2021-12-02 NOTE — Telephone Encounter (Signed)
Patient Advocate Encounter  Received notification from [place] that prior authorization for [facility] is required.   PA submitted on 6.4.23 Key [id number]  Status is pending     [facility] will continue to follow  Luciano Cutter, CPhT Patient Advocate Phone: [phone]

## 2021-12-03 ENCOUNTER — Other Ambulatory Visit (HOSPITAL_COMMUNITY): Payer: Self-pay

## 2021-12-03 NOTE — Telephone Encounter (Signed)
Received a fax regarding Prior Authorization from [employer] for [facility]. Authorization has been DENIED because it is not on your plan's Drug List (formulary). Medication authorization requires the following: (1) You need to try three (3) of these covered drugs: (a) Anoro Ellipta. (b) Serevent Diskus. (c) Stiolto Respimat.  (2) OR your doctor needs to give [place] specific medical reasons why three (3) of the covered drug(s) are not appropriate for you. [name] Kitchen

## 2021-12-04 ENCOUNTER — Ambulatory Visit: Payer: Medicare Other | Admitting: Occupational Therapy

## 2021-12-04 ENCOUNTER — Ambulatory Visit: Payer: Medicare Other | Attending: Internal Medicine

## 2021-12-04 DIAGNOSIS — R2689 Other abnormalities of gait and mobility: Secondary | ICD-10-CM | POA: Insufficient documentation

## 2021-12-04 DIAGNOSIS — R2681 Unsteadiness on feet: Secondary | ICD-10-CM | POA: Diagnosis not present

## 2021-12-04 DIAGNOSIS — M6281 Muscle weakness (generalized): Secondary | ICD-10-CM

## 2021-12-04 DIAGNOSIS — R262 Difficulty in walking, not elsewhere classified: Secondary | ICD-10-CM | POA: Insufficient documentation

## 2021-12-04 NOTE — Therapy (Signed)
OUTPATIENT OCCUPATIONAL THERAPY TREATMENT NOTE   Patient Name: Stephanie Cortez MRN: [id number] DOB:07/12/1933, 88 y.o., female Today's Date: 12/04/2021  PCP: Smith, Candace, MD  REFERRING PROVIDER: Ramaswamy, Murali, MD   END OF SESSION:   OT End of Session - 12/04/21 1505     Visit Number 7    Number of Visits 13    Date for OT Re-Evaluation 12/15/21    Authorization Type Medicare A & B    Authorization - Visit Number 7    Progress Note Due on Visit 10    OT Start Time 1412   pt in bathroom   OT Stop Time 1450    OT Time Calculation (min) 38 min    Activity Tolerance Patient tolerated treatment well    Behavior During Therapy WFL for tasks assessed/performed             Past Medical History:  Diagnosis Date   Anemia    - Hgb 9.7gm% on 07/13/2008 in [place] -  Hgg 129gm% wiht normal irone levsl and ferritin 10/27/2008 in [place] Recurrent otitis/sinusitis   Anxiety    chronic BZ prn   Atrial fibrillation (HCC)    chronic anticoag   Bronchiectasis    >PFT 07/13/2008 in [place]  Fev 1.9L/76%, FVC 2.45L/74, Ratio 79, TLC 121%, DLCO 64%  AE BRonchiectasis - Dec 2010.New Rx:  outpatient - Feb 2011 - Rx outpatient   CHF (congestive heart failure) (HCC)    COPD (chronic obstructive pulmonary disease) (HCC)    bronchiectasis   Cricopharyngeal achalasia    Depression    Diverticulosis    Dyslipidemia    Eczema    Fatty liver    GERD with stricture    Glaucoma    H. 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HCC)    Ventral hernia    Vertigo    Wears glasses    Past Surgical History:  Procedure Laterality Date   BOWEL RESECTION N/A 02/08/2020   Procedure: SMALL BOWEL RESECTION;  Surgeon: Tsuei, Matthew, MD;  [facility];  Service: General;  Laterality: N/A;   BREAST SURGERY     br bx   CARDIAC  CATHETERIZATION  07/01/2013   CATARACT EXTRACTION     both   COLONOSCOPY     ESOPHAGOSCOPY W/ BOTOX INJECTION  12/11/2011   Procedure: ESOPHAGOSCOPY WITH BOTOX INJECTION;  Surgeon: Christopher E Newman, MD;  Location: [facility];  Service: ENT;  Laterality: N/A;  esophogoscopy with dilation, botox injection   FOOT SURGERY  03/14/2011   gastroc slide-rt   GASTROSTOMY N/A 02/08/2020   Procedure: INSERTION OF GASTROSTOMY TUBE;  Surgeon: Tsuei, Matthew, MD;  [facility];  Service: General;  Laterality: N/A;   HIP ARTHROPLASTY Right 10/28/2019   Procedure: ARTHROPLASTY BIPOLAR HIP (HEMIARTHROPLASTY);  Surgeon: Yates, Mark C, MD;  [facility];  Service: Orthopedics;  Laterality: Right;   LAPAROTOMY N/A 02/08/2020   Procedure: EXPLORATORY LAPAROTOMY;  Surgeon: Tsuei, Matthew, MD;  [facility];  Service: General;  Laterality: N/A;   LYSIS OF ADHESION N/A 02/08/2020   Procedure: LYSIS OF ADHESIONS;  Surgeon: Tsuei, Matthew, MD;  [facility];  Service: General;  Laterality: N/A;   TOTAL ABDOMINAL HYSTERECTOMY     TOTAL HIP REVISION Right   11/16/2019   Procedure: TOTAL HIP REVISION BIPOLAR TO CEMENTED BIPOLAR;  Surgeon: Marybelle Killings, MD;  Location: [place];  Service: Orthopedics;  Laterality: Right;   WISDOM TOOTH EXTRACTION     Patient Active Problem List   Diagnosis Date Noted   Palliative care encounter 07/27/2021   Constipation 07/27/2021   Cricopharyngeal achalasia 06/24/2021   Disorder of cervical esophageal region 06/23/2021   Pressure injury of skin 06/21/2021   Physical deconditioning 06/21/2021   SOB (shortness of breath) 06/18/2021   Acute on chronic respiratory failure with hypoxia (HCC) 06/18/2021   Chronic diastolic CHF (congestive heart failure) ([place]) 06/18/2021   Chronic anemia 06/18/2021   COVID-19 virus infection 06/17/2021   Chronic pain of right knee 06/20/2020   Abnormality of gait 06/20/2020   Malnutrition of moderate degree 02/25/2020   S/P  percutaneous endoscopic gastrostomy (PEG) tube placement (HCC)    Post-operative pain    Subtherapeutic international normalized ratio (INR)    Debility 02/17/2020   Mesenteric ischemia (HCC)    Sepsis ([place]) 02/08/2020   Small bowel ischemia ([place]) 02/08/2020   Thrombocytosis 02/08/2020   Abnormal CT of the abdomen    Periprosthetic fracture around internal prosthetic hip joint 11/16/2019   Periprosthetic fracture around internal prosthetic hip joint, initial encounter    Chronic obstructive pulmonary disease (HCC)    Essential hypertension    Chronic anticoagulation    Acute blood loss anemia    Transaminitis    Hypoalbuminemia due to protein-calorie malnutrition (HCC)    Labile blood pressure    Drug induced constipation    Postoperative pain    Subcapital fracture of femur ([place]) 10/30/2019   Post-operative state    Prerenal azotemia    Leukocytosis    Closed right hip fracture, initial encounter ([place]) 10/27/2019   Hoarseness 02/19/2018   Sensorineural hearing loss (SNHL) of both ears 02/19/2018   Tinnitus, bilateral 02/19/2018   Cough 09/06/2017   Cough variant asthma 08/19/2017   Acute bronchitis 02/20/2016   Pulmonary air trapping 01/04/2016   Encounter for preoperative pulmonary examination 01/04/2016   NSIP (nonspecific interstitial pneumonia) ([place]) 09/26/2015   Chronic sinusitis 09/26/2015   ILD (interstitial lung disease) ([place]) 07/12/2014   Upper airway cough syndrome 09/23/2013   Encounter for therapeutic drug monitoring 08/11/2013   Congestive heart failure ([place]) 07/08/2013   Coronary artery calcification seen on CAT scan 03/15/2013   Nodule of left lung 03/15/2013   Orthostatic lightheadedness 02/09/2013   Depression    Diverticulitis 01/01/2012   GERD (gastroesophageal reflux disease) 01/01/2012   Hypothyroid 11/23/2010   Edema 10/04/2010   Bronchiectasis ([place])    HYPERTENSION, BENIGN 10/22/2008   A-fib ([place])     ONSET DATE: 10/06/2021 (referral date)    REFERRING DIAG: R53.81 (ICD-10-CM) - Other malaise J84.9 (ICD-10-CM) - Interstitial pulmonary disease, unspecified   THERAPY DIAG:  Muscle weakness (generalized)  Unsteadiness on feet  PERTINENT HISTORY: R hip fx s/p THA and required revision, COPD, A-fib, CHF   PRECAUTIONS: Fall  SUBJECTIVE:   SUBJECTIVE STATEMENT: Pt reports taking Lasix today and having an issue due to frequency of toileting needs.  PAIN: Are you having pain? No - reports R hip pain only w/ movement/mobility  OBJECTIVE:   TODAY'S TREATMENT: Hand Gripper: with RUE on level 25# with yellow spring. Pt picked up 1 inch blocks with gripper with 0 drops and minimal difficulty.  Attempted to complete with 30# but unable to complete due to increased difficulty.  After completing with LUE, returned  to RUE and pt then able to complete on 30# with green spring with improved success.  Pt with 0 drop and min-moderate difficulty.   Hand Gripper: with LUE on level 15# with green spring. Pt picked up 1 inch blocks with gripper with 0 drops and minimal difficulty. Increased resistance to 20# with green spring.  Pt then able to pick up all blocks with gripper with 0 drop and moderate difficulty. Reviewed theraband UE HEP and modified yoga poses/exercises with pt stating that she is completing these exercises at home. Theraputty for BUE strengthening.  Therapist instructed pt in full hand grip and rolling out putty on table with BUE.  Pt able to complete bilaterally, with reports of increased difficulty L > R.  Therapist provided pt with theraputty HEP to address grip and pinch strength with plan to further address each exercise and add more as appropriate.    PATIENT EDUCATION: Education details: Educated on use of theraputty for BUE strengthening as well as continued use of balance and theraband HEP; discussed benefit and purpose of OT recommending strategies w/ pt verbalizing understanding Person educated: Patient Education  method: Explanation Education comprehension: verbalized understanding     HOME EXERCISE PROGRAM: Access Code: [email or web address]     GOALS: Goals reviewed with patient? Yes   SHORT TERM GOALS: Target date: 11/24/2021   Pt will be independent in HEP to address BUE strengthening and conditioning to increase independence with ADLs and IADLs Baseline: Goal status: partially met   2.  Pt will verbalize energy conservation strategies and provide 2 examples that she is incorporating to increase her safety/independence with ADLs and IADLs. Baseline:  Goal status: partially met     LONG TERM GOALS: Target date:  12/15/21   Pt will demonstrate improved strength in BUE to open refrigerator door with increased ease and no increase in pain. Baseline: R: 36 and L: 24 Goal status: on-going   2.  Pt will be able to demonstrate safe strategy to pick items up from the floor with/without use of AE as appropriate. Baseline:  Goal status: on-going   3.  Pt will demonstrate improved ROM in BUE to allow for increased ease with obtaining items from shelves in cabinets and refrigerator. Baseline: Shoulder flexion L:135 and R: 140 Goal status: on-going   4.  Pt will demonstrate improved ability to complete sit > stand from various height surfaces to increase independence with functional transfers without cues or assistance. Baseline:  Goal status: on-going     ASSESSMENT:   CLINICAL IMPRESSION: Session with focus on recall of HEP exercises and education on importance of completing for overall improvements in balance and endurance as needed for ADLs and IADLs.  Therapist reviewed exercises during rest breaks during table top tasks.  Pt demonstrating improved UE grip strength with repetition.  Therapist continued education on importance of mobility, engaging in HEP, and modifications of routines to increase endurance, independence, and safety.   PERFORMANCE DEFICITS in functional skills including ADLs, IADLs,  dexterity, ROM, strength, pain, flexibility, GMC, mobility, balance, body mechanics, endurance, cardiopulmonary status limiting function, decreased knowledge of precautions, decreased knowledge of use of DME, and UE functional use and psychosocial skills including environmental adaptation.    IMPAIRMENTS are limiting patient from ADLs and IADLs.    COMORBIDITIES may have co-morbidities  that affects occupational performance. Patient will benefit from skilled OT to address above impairments and improve overall function.      PLAN: OT FREQUENCY: 2x/week   OT DURATION: 6 weeks  PLANNED INTERVENTIONS: self care/ADL training, therapeutic exercise, therapeutic activity, neuromuscular re-education, manual therapy, passive range of motion, balance training, functional mobility training, patient/family education, psychosocial skills training, energy conservation, coping strategies training, and DME and/or AE instructions   RECOMMENDED OTHER SERVICES: NA   CONSULTED AND AGREED WITH PLAN OF CARE: Patient and family member/caregiver   PLAN FOR NEXT SESSION: Review modified yoga HEP; Review theraputty HEP and add as appropriate   Simonne Come, OTR/L 12/04/2021, 2:21 PM

## 2021-12-04 NOTE — Telephone Encounter (Signed)
None of the proposed inhalers have inhaled steroid in it.  She has asthma phenotype with wheezing.  She has been on Symbicort or inhaled steroids for a long time including the time she was living in [place] over 10 years ago.[name] Kitchen  Please do a letter as appropriate

## 2021-12-04 NOTE — Telephone Encounter (Signed)
Routing to MR. 

## 2021-12-05 ENCOUNTER — Telehealth: Payer: Self-pay | Admitting: Internal Medicine

## 2021-12-05 NOTE — Telephone Encounter (Signed)
   Stephanie Cortez  -Had overnight oxygen study on 11/23/2021.  Pulse ox less than or equal to 88% was only for 50 seconds  Plan - No need for night oxygen

## 2021-12-06 ENCOUNTER — Ambulatory Visit: Payer: Medicare Other

## 2021-12-06 ENCOUNTER — Ambulatory Visit: Payer: Medicare Other | Admitting: Occupational Therapy

## 2021-12-06 DIAGNOSIS — M6281 Muscle weakness (generalized): Secondary | ICD-10-CM | POA: Diagnosis not present

## 2021-12-06 DIAGNOSIS — R2689 Other abnormalities of gait and mobility: Secondary | ICD-10-CM | POA: Diagnosis not present

## 2021-12-06 DIAGNOSIS — R2681 Unsteadiness on feet: Secondary | ICD-10-CM

## 2021-12-06 DIAGNOSIS — R262 Difficulty in walking, not elsewhere classified: Secondary | ICD-10-CM

## 2021-12-06 NOTE — Therapy (Signed)
[facility] [address], [place], [place], [address] Phone: [phone]   Fax:  [phone]  Physical Therapy Treatment  Patient Details  Name: CLEOPATRA SARDO MRN: [id number] Date of Birth: 01/17/1934 Referring Provider (PT): Brand Males, MD   Encounter Date: 12/06/2021   PT End of Session - 12/06/21 1022     Visit Number 9    Number of Visits 16    Date for PT Re-Evaluation 12/21/21    Authorization Type Medicare A and B/ [employer] Supplemental 2023    Progress Note Due on Visit 10    PT Start Time 1443    PT Stop Time 1100    PT Time Calculation (min) 45 min    Activity Tolerance Patient tolerated treatment well    Behavior During Therapy [facility] for tasks assessed/performed             Past Medical History:  Diagnosis Date   Anemia    - Hgb 9.7gm% on 07/13/2008 in [place] 129gm% wiht normal irone levsl and ferritin 10/27/2008 in [place] Recurrent otitis/sinusitis   Anxiety    chronic BZ prn   Atrial fibrillation ([place])    chronic anticoag   Bronchiectasis    >PFT 07/13/2008 in [place] 1.9L/76%, FVC 2.45L/74, Ratio 79, TLC 121%, DLCO 64%  AE BRonchiectasis - Dec 2010.New Rx:  outpatient - Feb 2011 - Rx outpatient   CHF (congestive heart failure) ([place])    COPD (chronic obstructive pulmonary disease) (HCC)    bronchiectasis   Cricopharyngeal achalasia    Depression    Diverticulosis    Dyslipidemia    Eczema    Fatty liver    GERD with stricture    Glaucoma    H. 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[place])    Ventral hernia    Vertigo    Wears glasses     Past Surgical History:  Procedure Laterality Date   BOWEL RESECTION N/A 02/08/2020   Procedure: SMALL BOWEL RESECTION;  Surgeon: Donnie Mesa, MD;  Location: [place];  Service: General;   Laterality: N/A;   BREAST SURGERY     br bx   CARDIAC CATHETERIZATION  07/01/2013   CATARACT EXTRACTION     both   COLONOSCOPY     ESOPHAGOSCOPY W/ BOTOX INJECTION  12/11/2011   Procedure: ESOPHAGOSCOPY WITH BOTOX INJECTION;  Surgeon: Rozetta Nunnery, MD;  Location: [place];  Service: ENT;  Laterality: N/A;  esophogoscopy with dilation, botox injection   FOOT SURGERY  03/14/2011   gastroc slide-rt   GASTROSTOMY N/A 02/08/2020   Procedure: INSERTION OF GASTROSTOMY TUBE;  Surgeon: Donnie Mesa, MD;  Location: [place];  Service: General;  Laterality: N/A;   HIP ARTHROPLASTY Right 10/28/2019   Procedure: ARTHROPLASTY BIPOLAR HIP (HEMIARTHROPLASTY);  Surgeon: Marybelle Killings, MD;  [facility];  Service: Orthopedics;  Laterality: Right;   LAPAROTOMY N/A 02/08/2020   Procedure: EXPLORATORY LAPAROTOMY;  Surgeon: Donnie Mesa, MD;  Location: [place];  Service: General;  Laterality: N/A;   LYSIS OF ADHESION N/A 02/08/2020   Procedure: LYSIS OF ADHESIONS;  Surgeon: Donnie Mesa, MD;  Location: [place];  Service: General;  Laterality: N/A;   TOTAL ABDOMINAL HYSTERECTOMY  TOTAL HIP REVISION Right 11/16/2019   Procedure: TOTAL HIP REVISION BIPOLAR TO CEMENTED BIPOLAR;  Surgeon: Marybelle Killings, MD;  Location: [place];  Service: Orthopedics;  Laterality: Right;   WISDOM TOOTH EXTRACTION      There were no vitals filed for this visit.   Subjective Assessment - 12/06/21 1023     Subjective Feeling the same, leg is not doing anything different    Patient is accompained by: Family member   daughter   Pertinent History right hip fx s/p THA and required revision, COPD, A-fib, CHF    Limitations Lifting;Standing;Walking;House hold activities    How long can you sit comfortably? 15-30 min    How long can you stand comfortably? varies    Patient Stated Goals decrease right hip pain, improve strength/balance/walking    Pain Onset More than a month ago                                [facility] Adult PT Treatment/Exercise - 12/06/21 0001       Transfers   Transfers Sit to Stand;Stand to Sit    Number of Reps 10 reps    Transfer Cueing cues in facilitating right knee flexion and body position to improve trunk over BOS, limited by lackin of right hip flexion                 Balance Exercises - 12/06/21 0001       Balance Exercises: Standing   Standing Eyes Opened Narrow base of support (BOS);Foam/compliant surface;3 reps;30 secs;Wide (BOA);5 reps    Standing Eyes Closed Narrow base of support (BOS);Foam/compliant surface;5 reps;10 secs    Standing, One Foot on a Step 3 reps;15 secs   LLE on BOSU. one trial BUE, one trial single, one trial no UE   Retro Gait 2 reps   x 2 min in parallel bars   Sidestepping 1 rep   2 min parallel bars   Heel Raises Both;20 reps    Toe Raise Both;20 reps                  PT Short Term Goals - 11/23/21 1522       PT SHORT TERM GOAL #1   Title Patient will be independent in HEP to improve functional outcomes    Baseline poor recall    Time 4    Period Weeks    Status On-going    Target Date 11/23/21      PT SHORT TERM GOAL #2   Title Demonstrate right hip flexion to 90 degrees and right knee flexion to 100 degrees to improve body mechanics for transfers    Baseline 70 degrees hip flexion, 92 right knee flexion-- no change    Time 4    Period Weeks    Status On-going    Target Date 11/23/21      PT SHORT TERM GOAL #3   Title Manifest improved BLE strength per time of 30 sec 5xSTS test    Baseline 38.56" from elevated EOM; 59.44 sec 18" seat dependent on BUE support from arm rests    Time 4    Period Weeks    Status On-going    Target Date 11/23/21               PT Long Term Goals - 10/26/21 1706       PT LONG TERM GOAL #1   Title  Demo reduced risk for falls per TUG test time 20 sec    Baseline 59.03 sec    Time 8    Period Weeks    Status New    Target  Date 12/21/21      PT LONG TERM GOAL #2   Title Manifest improved BLE strength per time 25 sec 5xSTS    Time 8    Period Weeks    Status New    Target Date 12/21/21                   Plan - 12/06/21 1107     Clinical Impression Statement Progressing with static balance activities, especially on compliant surface tolertaing longer periods of time without UE support needed. Tendency for anterior LOB on compliant surface, especially with eyes closed.  Toleratin forced WBin on RLE without issue maintaining upright posture with LLE elevated on BOSU with less compensation via trun kflexio nneeded. Continued tx inidcated to progress POC. Progress not due next visit    Personal Factors and Comorbidities Age;Comorbidity 3+;Time since onset of injury/illness/exacerbation    Comorbidities right LE orthopedics, COPD, cardiac conditions    Examination-Activity Limitations Carry;Dressing;Lift;Toileting;Stand;Stairs;Squat;Locomotion Level;Transfers    Examination-Participation Restrictions Cleaning;Community Activity;Driving;Yard Work;Occupation;Meal Prep    Stability/Clinical Decision Making Evolving/Moderate complexity    Rehab Potential Fair    PT Frequency 2x / week    PT Duration 8 weeks    PT Treatment/Interventions ADLs/Self Care Home Management;Aquatic Therapy;Electrical Stimulation;DME Instruction;Gait training;Stair training;Functional mobility training;Therapeutic activities;Therapeutic exercise;Balance training;Neuromuscular re-education;Manual techniques;Passive range of motion;Dry needling;Energy conservation;Joint Manipulations;Vestibular;Taping;Patient/family education    PT Next Visit Plan Progress Note    PT Home Exercise Plan [id number] seated knee flexion AAROM, hip add squeeze, standing with LLE elevated on 6" step, sidestepping, heel slides with strap    Consulted and Agree with Plan of Care Patient;Family member/caregiver    Family Member Consulted daughter              Patient will benefit from skilled therapeutic intervention in order to improve the following deficits and impairments:  Abnormal gait, Decreased activity tolerance, Decreased balance, Decreased mobility, Decreased range of motion, Decreased strength, Difficulty walking, Impaired flexibility, Improper body mechanics, Pain  Visit Diagnosis: Muscle weakness (generalized)  Unsteadiness on feet  Difficulty in walking, not elsewhere classified  Other abnormalities of gait and mobility     Problem List Patient Active Problem List   Diagnosis Date Noted   Palliative care encounter 07/27/2021   Constipation 07/27/2021   Cricopharyngeal achalasia 06/24/2021   Disorder of cervical esophageal region 06/23/2021   Pressure injury of skin 06/21/2021   Physical deconditioning 06/21/2021   SOB (shortness of breath) 06/18/2021   Acute on chronic respiratory failure with hypoxia (HCC) 06/18/2021   Chronic diastolic CHF (congestive heart failure) ([place]) 06/18/2021   Chronic anemia 06/18/2021   COVID-19 virus infection 06/17/2021   Chronic pain of right knee 06/20/2020   Abnormality of gait 06/20/2020   Malnutrition of moderate degree 02/25/2020   S/P percutaneous endoscopic gastrostomy (PEG) tube placement (HCC)    Post-operative pain    Subtherapeutic international normalized ratio (INR)    Debility 02/17/2020   Mesenteric ischemia (HCC)    Sepsis ([place]) 02/08/2020   Small bowel ischemia ([place]) 02/08/2020   Thrombocytosis 02/08/2020   Abnormal CT of the abdomen    Periprosthetic fracture around internal prosthetic hip joint 11/16/2019   Periprosthetic fracture around internal prosthetic hip joint, initial encounter    Chronic obstructive pulmonary disease ([place])  Essential hypertension    Chronic anticoagulation    Acute blood loss anemia    Transaminitis    Hypoalbuminemia due to protein-calorie malnutrition (HCC)    Labile blood pressure    Drug induced constipation     Postoperative pain    Subcapital fracture of femur ([place]) 10/30/2019   Post-operative state    Prerenal azotemia    Leukocytosis    Closed right hip fracture, initial encounter ([place]) 10/27/2019   Hoarseness 02/19/2018   Sensorineural hearing loss (SNHL) of both ears 02/19/2018   Tinnitus, bilateral 02/19/2018   Cough 09/06/2017   Cough variant asthma 08/19/2017   Acute bronchitis 02/20/2016   Pulmonary air trapping 01/04/2016   Encounter for preoperative pulmonary examination 01/04/2016   NSIP (nonspecific interstitial pneumonia) ([place]) 09/26/2015   Chronic sinusitis 09/26/2015   ILD (interstitial lung disease) ([place]) 07/12/2014   Upper airway cough syndrome 09/23/2013   Encounter for therapeutic drug monitoring 08/11/2013   Congestive heart failure ([place]) 07/08/2013   Coronary artery calcification seen on CAT scan 03/15/2013   Nodule of left lung 03/15/2013   Orthostatic lightheadedness 02/09/2013   Depression    Diverticulitis 01/01/2012   GERD (gastroesophageal reflux disease) 01/01/2012   Hypothyroid 11/23/2010   Edema 10/04/2010   Bronchiectasis ([place])    HYPERTENSION, BENIGN 10/22/2008   A-fib ([place])     Toniann Fail, PT 12/06/2021, 11:14 AM  [facility] [address] [place], [place], [address] Phone: [phone]   Fax:  [phone]  Name: YANIL DAWE MRN: [id number] Date of Birth: February 18, 1934

## 2021-12-06 NOTE — Therapy (Signed)
OUTPATIENT OCCUPATIONAL THERAPY TREATMENT NOTE   Patient Name: Stephanie Cortez MRN: [id number] DOB:1934/05/16, 88 y.o., female 4 Date: 12/06/2021  PCP: [name], MD  REFERRING PROVIDER: Brand Males, MD   END OF SESSION:   OT End of Session - 12/06/21 1127     Visit Number 8    Number of Visits 13    Date for OT Re-Evaluation 12/15/21    Authorization Type Medicare A & B    Authorization - Visit Number 8    Progress Note Due on Visit 10    OT Start Time 1100    OT Stop Time 1145    OT Time Calculation (min) 45 min    Activity Tolerance Patient tolerated treatment well    Behavior During Therapy WFL for tasks assessed/performed              Past Medical History:  Diagnosis Date   Anemia    - Hgb 9.7gm% on 07/13/2008 in [place] 129gm% wiht normal irone levsl and ferritin 10/27/2008 in [place] Recurrent otitis/sinusitis   Anxiety    chronic BZ prn   Atrial fibrillation ([place])    chronic anticoag   Bronchiectasis    >PFT 07/13/2008 in [place] 1.9L/76%, FVC 2.45L/74, Ratio 79, TLC 121%, DLCO 64%  AE BRonchiectasis - Dec 2010.New Rx:  outpatient - Feb 2011 - Rx outpatient   CHF (congestive heart failure) ([place])    COPD (chronic obstructive pulmonary disease) (HCC)    bronchiectasis   Cricopharyngeal achalasia    Depression    Diverticulosis    Dyslipidemia    Eczema    Fatty liver    GERD with stricture    Glaucoma    H. 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[place])    Ventral hernia    Vertigo    Wears glasses    Past Surgical History:  Procedure Laterality Date   BOWEL RESECTION N/A 02/08/2020   Procedure: SMALL BOWEL RESECTION;  Surgeon: Donnie Mesa, MD;  Location: [place];  Service: General;  Laterality: N/A;   BREAST SURGERY     br bx   CARDIAC CATHETERIZATION   07/01/2013   CATARACT EXTRACTION     both   COLONOSCOPY     ESOPHAGOSCOPY W/ BOTOX INJECTION  12/11/2011   Procedure: ESOPHAGOSCOPY WITH BOTOX INJECTION;  Surgeon: Rozetta Nunnery, MD;  Location: [place];  Service: ENT;  Laterality: N/A;  esophogoscopy with dilation, botox injection   FOOT SURGERY  03/14/2011   gastroc slide-rt   GASTROSTOMY N/A 02/08/2020   Procedure: INSERTION OF GASTROSTOMY TUBE;  Surgeon: Donnie Mesa, MD;  Location: [place];  Service: General;  Laterality: N/A;   HIP ARTHROPLASTY Right 10/28/2019   Procedure: ARTHROPLASTY BIPOLAR HIP (HEMIARTHROPLASTY);  Surgeon: Marybelle Killings, MD;  [facility];  Service: Orthopedics;  Laterality: Right;   LAPAROTOMY N/A 02/08/2020   Procedure: EXPLORATORY LAPAROTOMY;  Surgeon: Donnie Mesa, MD;  Location: [place];  Service: General;  Laterality: N/A;   LYSIS OF ADHESION N/A 02/08/2020   Procedure: LYSIS OF ADHESIONS;  Surgeon: Donnie Mesa, MD;  Location: [place];  Service: General;  Laterality: N/A;   TOTAL ABDOMINAL HYSTERECTOMY     TOTAL HIP REVISION Right 11/16/2019  Procedure: TOTAL HIP REVISION BIPOLAR TO CEMENTED BIPOLAR;  Surgeon: Marybelle Killings, MD;  Location: [place];  Service: Orthopedics;  Laterality: Right;   WISDOM TOOTH EXTRACTION     Patient Active Problem List   Diagnosis Date Noted   Palliative care encounter 07/27/2021   Constipation 07/27/2021   Cricopharyngeal achalasia 06/24/2021   Disorder of cervical esophageal region 06/23/2021   Pressure injury of skin 06/21/2021   Physical deconditioning 06/21/2021   SOB (shortness of breath) 06/18/2021   Acute on chronic respiratory failure with hypoxia (HCC) 06/18/2021   Chronic diastolic CHF (congestive heart failure) ([place]) 06/18/2021   Chronic anemia 06/18/2021   COVID-19 virus infection 06/17/2021   Chronic pain of right knee 06/20/2020   Abnormality of gait 06/20/2020   Malnutrition of moderate degree 02/25/2020   S/P percutaneous endoscopic  gastrostomy (PEG) tube placement (HCC)    Post-operative pain    Subtherapeutic international normalized ratio (INR)    Debility 02/17/2020   Mesenteric ischemia (HCC)    Sepsis ([place]) 02/08/2020   Small bowel ischemia ([place]) 02/08/2020   Thrombocytosis 02/08/2020   Abnormal CT of the abdomen    Periprosthetic fracture around internal prosthetic hip joint 11/16/2019   Periprosthetic fracture around internal prosthetic hip joint, initial encounter    Chronic obstructive pulmonary disease (HCC)    Essential hypertension    Chronic anticoagulation    Acute blood loss anemia    Transaminitis    Hypoalbuminemia due to protein-calorie malnutrition (HCC)    Labile blood pressure    Drug induced constipation    Postoperative pain    Subcapital fracture of femur ([place]) 10/30/2019   Post-operative state    Prerenal azotemia    Leukocytosis    Closed right hip fracture, initial encounter ([place]) 10/27/2019   Hoarseness 02/19/2018   Sensorineural hearing loss (SNHL) of both ears 02/19/2018   Tinnitus, bilateral 02/19/2018   Cough 09/06/2017   Cough variant asthma 08/19/2017   Acute bronchitis 02/20/2016   Pulmonary air trapping 01/04/2016   Encounter for preoperative pulmonary examination 01/04/2016   NSIP (nonspecific interstitial pneumonia) ([place]) 09/26/2015   Chronic sinusitis 09/26/2015   ILD (interstitial lung disease) ([place]) 07/12/2014   Upper airway cough syndrome 09/23/2013   Encounter for therapeutic drug monitoring 08/11/2013   Congestive heart failure ([place]) 07/08/2013   Coronary artery calcification seen on CAT scan 03/15/2013   Nodule of left lung 03/15/2013   Orthostatic lightheadedness 02/09/2013   Depression    Diverticulitis 01/01/2012   GERD (gastroesophageal reflux disease) 01/01/2012   Hypothyroid 11/23/2010   Edema 10/04/2010   Bronchiectasis ([place])    HYPERTENSION, BENIGN 10/22/2008   A-fib ([place])     ONSET DATE: 10/06/2021 (referral date)   REFERRING DIAG:  R53.81 (ICD-10-CM) - Other malaise J84.9 (ICD-10-CM) - Interstitial pulmonary disease, unspecified   THERAPY DIAG:  Muscle weakness (generalized)  Unsteadiness on feet  PERTINENT HISTORY: R hip fx s/p THA and required revision, COPD, A-fib, CHF   PRECAUTIONS: Fall  SUBJECTIVE:   SUBJECTIVE STATEMENT: "It's been awhile since all this started, I was hoping the pain would be better by now".  PAIN: Are you having pain? Yes.  7/10 in R thigh/hip.   OBJECTIVE:   TODAY'S TREATMENT: Anterior weight shift/modified crunches in sitting: Attempted to shift forward with 1 kg ball in hands to tap target to facilitate increased anterior weight shift as needed for sit > stand and LB dressing.  Pt unable to complete, demonstrating increased rounding in back and little to  no anterior weight shift/hip flexion.  Transitioned to completing trunk rotation to begin loosening up hips.  Pt able to complete 2 sets of 10 rotation to both R and L side.  Educated on functional carryover to sit > stand and dressing tasks. Pt demonstrating increased difficulty with trunk rotation to R due to tightness in R hip/thigh.  Therapist providing demonstration, verbal, and tactile cues for weight shift. Anterior and lateral weight shift with reaching with alternating UE to pick up colored pegs when crossing midline to facilitate trunk rotation and lateral lean as needed to for weight shift to increase hip flexion as needed for sit > stand and LB dressing.  Then reaching forward to place peg in peg board to facilitate anterior weight shift.   Pt continues to demonstrate decreased hip flexion despite tactile cues and manual facilitation for anterior weight shift.  Pt continues to require frequent rest breaks due to ILD and effort exerted during activity.    PATIENT EDUCATION: Education details: Educated on transitional movements and anterior and lateral weight shifting; discussed benefit and purpose of OT recommending strategies  w/ pt verbalizing understanding Person educated: Patient Education method: Explanation Education comprehension: verbalized understanding     HOME EXERCISE PROGRAM: Access Code: [email or web address]     GOALS: Goals reviewed with patient? Yes   SHORT TERM GOALS: Target date: 11/24/2021   Pt will be independent in HEP to address BUE strengthening and conditioning to increase independence with ADLs and IADLs Baseline: Goal status: partially met   2.  Pt will verbalize energy conservation strategies and provide 2 examples that she is incorporating to increase her safety/independence with ADLs and IADLs. Baseline:  Goal status: partially met     LONG TERM GOALS: Target date:  12/15/21   Pt will demonstrate improved strength in BUE to open refrigerator door with increased ease and no increase in pain. Baseline: R: 36 and L: 24 Goal status: on-going   2.  Pt will be able to demonstrate safe strategy to pick items up from the floor with/without use of AE as appropriate. Baseline:  Goal status: on-going   3.  Pt will demonstrate improved ROM in BUE to allow for increased ease with obtaining items from shelves in cabinets and refrigerator. Baseline: Shoulder flexion L:135 and R: 140 Goal status: on-going   4.  Pt will demonstrate improved ability to complete sit > stand from various height surfaces to increase independence with functional transfers without cues or assistance. Baseline:  Goal status: on-going     ASSESSMENT:   CLINICAL IMPRESSION: Pt demonstrating increased difficulty with anterior weight shifting due to decreased hip flexion.  Therefore limited in anterior weight shifting tasks to increase forward reach as needed to engage in ADLs and IADLs.  Pt receptive to modifications to engage in trunk rotation and lateral weight shifting to continue to address anterior weight shifting as able. Therapist continued education on importance of mobility, engaging in HEP, and modifications of  routines to increase endurance, independence, and safety.   PERFORMANCE DEFICITS in functional skills including ADLs, IADLs, dexterity, ROM, strength, pain, flexibility, GMC, mobility, balance, body mechanics, endurance, cardiopulmonary status limiting function, decreased knowledge of precautions, decreased knowledge of use of DME, and UE functional use and psychosocial skills including environmental adaptation.    IMPAIRMENTS are limiting patient from ADLs and IADLs.    COMORBIDITIES may have co-morbidities  that affects occupational performance. Patient will benefit from skilled OT to address above impairments and improve overall function.  PLAN: OT FREQUENCY: 2x/week   OT DURATION: 6 weeks   PLANNED INTERVENTIONS: self care/ADL training, therapeutic exercise, therapeutic activity, neuromuscular re-education, manual therapy, passive range of motion, balance training, functional mobility training, patient/family education, psychosocial skills training, energy conservation, coping strategies training, and DME and/or AE instructions   RECOMMENDED OTHER SERVICES: NA   CONSULTED AND AGREED WITH PLAN OF CARE: Patient and family member/caregiver   PLAN FOR NEXT SESSION: Anterior and lateral weight shifting in sitting and standing (as pt reports difficulty getting items out of the oven) and still with difficulty with sit > stand without UE support/arm rests.  Review theraputty HEP and add as appropriate   Simonne Come, OTR/L 12/06/2021, 2:21 PM

## 2021-12-07 ENCOUNTER — Other Ambulatory Visit: Payer: Medicare Other

## 2021-12-07 ENCOUNTER — Other Ambulatory Visit (HOSPITAL_COMMUNITY): Payer: Self-pay

## 2021-12-07 NOTE — Telephone Encounter (Signed)
Called and spoke with pt letting her know the results of the ONO and she verbalized understanding. Nothing further needed. 

## 2021-12-08 ENCOUNTER — Other Ambulatory Visit (HOSPITAL_COMMUNITY): Payer: Self-pay

## 2021-12-08 NOTE — Telephone Encounter (Signed)
Appeal for SYMBICORT 80/4.5 has been submitted

## 2021-12-08 NOTE — Telephone Encounter (Signed)
Called and spoke with patients daughter. Advised the patients daughter that the appeal for the symbicort has been submitted. Advised patients daughter that we did not have any samples of symbicort.  She verbalized understanding. Nothing further needed.

## 2021-12-10 ENCOUNTER — Other Ambulatory Visit: Payer: Self-pay | Admitting: Internal Medicine

## 2021-12-10 DIAGNOSIS — I4811 Longstanding persistent atrial fibrillation: Secondary | ICD-10-CM

## 2021-12-11 ENCOUNTER — Ambulatory Visit: Payer: Medicare Other | Admitting: Occupational Therapy

## 2021-12-11 ENCOUNTER — Ambulatory Visit: Payer: Medicare Other | Admitting: Physical Therapy

## 2021-12-11 ENCOUNTER — Encounter: Payer: Self-pay | Admitting: Physical Therapy

## 2021-12-11 DIAGNOSIS — R2681 Unsteadiness on feet: Secondary | ICD-10-CM

## 2021-12-11 DIAGNOSIS — R262 Difficulty in walking, not elsewhere classified: Secondary | ICD-10-CM | POA: Diagnosis not present

## 2021-12-11 DIAGNOSIS — M6281 Muscle weakness (generalized): Secondary | ICD-10-CM | POA: Diagnosis not present

## 2021-12-11 DIAGNOSIS — R2689 Other abnormalities of gait and mobility: Secondary | ICD-10-CM | POA: Diagnosis not present

## 2021-12-11 NOTE — Telephone Encounter (Signed)
Per [employer], Pharmacist, pt should remain on 2.'5mg'$  BID at this time "her weight is heavier now than usual so I would keep her on the 2.5 but just do 90 days"   Refill sent to requested pharmacy.

## 2021-12-11 NOTE — Therapy (Signed)
[facility] [address], [place], [place], [address] Phone: [phone]   Fax:  [phone]  Physical Therapy Treatment  Patient Details  Name: Stephanie Cortez MRN: [id number] Date of Birth: 1934/01/19 Referring Provider (PT): Brand Males, MD  Progress Note Reporting Period 10/26/2021 to 12/11/2021  See note below for Objective Data and Assessment of Progress/Goals.     Encounter Date: 12/11/2021   PT End of Session - 12/11/21 1326     Visit Number 10    Number of Visits 16    Date for PT Re-Evaluation 12/21/21    Authorization Type Medicare A and B/ [employer] Supplemental 2023    Progress Note Due on Visit 10    PT Start Time 6073   pt in restroom 6 minutes during session   PT Stop Time 1402    PT Time Calculation (min) 40 min    Activity Tolerance Patient tolerated treatment well    Behavior During Therapy [facility] for tasks assessed/performed             Past Medical History:  Diagnosis Date   Anemia    - Hgb 9.7gm% on 07/13/2008 in [place] 129gm% wiht normal irone levsl and ferritin 10/27/2008 in [place] Recurrent otitis/sinusitis   Anxiety    chronic BZ prn   Atrial fibrillation (HCC)    chronic anticoag   Bronchiectasis    >PFT 07/13/2008 in [place] 1.9L/76%, FVC 2.45L/74, Ratio 79, TLC 121%, DLCO 64%  AE BRonchiectasis - Dec 2010.New Rx:  outpatient - Feb 2011 - Rx outpatient   CHF (congestive heart failure) ([place])    COPD (chronic obstructive pulmonary disease) (HCC)    bronchiectasis   Cricopharyngeal achalasia    Depression    Diverticulosis    Dyslipidemia    Eczema    Fatty liver    GERD with stricture    Glaucoma    H. 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[place])    Ventral hernia    Vertigo    Wears glasses     Past Surgical  History:  Procedure Laterality Date   BOWEL RESECTION N/A 02/08/2020   Procedure: SMALL BOWEL RESECTION;  Surgeon: Donnie Mesa, MD;  Location: [facility];  Service: General;  Laterality: N/A;   BREAST SURGERY     br bx   CARDIAC CATHETERIZATION  07/01/2013   CATARACT EXTRACTION     both   COLONOSCOPY     ESOPHAGOSCOPY W/ BOTOX INJECTION  12/11/2011   Procedure: ESOPHAGOSCOPY WITH BOTOX INJECTION;  Surgeon: Rozetta Nunnery, MD;  Location: [facility];  Service: ENT;  Laterality: N/A;  esophogoscopy with dilation, botox injection   FOOT SURGERY  03/14/2011   gastroc slide-rt   GASTROSTOMY N/A 02/08/2020   Procedure: INSERTION OF GASTROSTOMY TUBE;  Surgeon: Donnie Mesa, MD;  Location: [facility];  Service: General;  Laterality: N/A;   HIP ARTHROPLASTY Right 10/28/2019   Procedure: ARTHROPLASTY BIPOLAR HIP (HEMIARTHROPLASTY);  Surgeon: Marybelle Killings, MD;  Location: [facility];  Service: Orthopedics;  Laterality: Right;   LAPAROTOMY N/A 02/08/2020   Procedure: EXPLORATORY LAPAROTOMY;  Surgeon: Donnie Mesa, MD;  Location: [facility];  Service: General;  Laterality: N/A;   LYSIS OF ADHESION  N/A 02/08/2020   Procedure: LYSIS OF ADHESIONS;  Surgeon: Donnie Mesa, MD;  Location: [facility];  Service: General;  Laterality: N/A;   TOTAL ABDOMINAL HYSTERECTOMY     TOTAL HIP REVISION Right 11/16/2019   Procedure: TOTAL HIP REVISION BIPOLAR TO CEMENTED BIPOLAR;  Surgeon: Marybelle Killings, MD;  Location: [facility];  Service: Orthopedics;  Laterality: Right;   WISDOM TOOTH EXTRACTION      There were no vitals filed for this visit.   Subjective Assessment - 12/11/21 1326     Subjective Doing well.  No changes.    Patient is accompained by: --    Pertinent History right hip fx s/p THA and required revision, COPD, A-fib, CHF    Limitations Lifting;Standing;Walking;House hold activities    How long can you sit comfortably? 15-30 min    How long can you stand comfortably? varies    Patient Stated Goals decrease  right hip pain, improve strength/balance/walking    Currently in Pain? Yes    Pain Score 7     Pain Location Hip    Pain Orientation Right;Lateral    Pain Descriptors / Indicators Sore;Tiring    Pain Type Chronic pain    Pain Onset More than a month ago    Pain Frequency Constant    Aggravating Factors  walking too long and standing    Pain Relieving Factors lying on that side alleviates                [facility] PT Assessment - 12/11/21 0001       AROM   Overall AROM  Deficits;Other (comment)   in sitting   Right Knee Flexion 85                           [facility] Adult PT Treatment/Exercise - 12/11/21 0001       Transfers   Transfers Sit to Stand;Stand to Sit    Sit to Stand 6: Modified independent (Device/Increase time)    Five time sit to stand comments  54.84   BUE support at armrests; compensatory movements LLE to compensate for decreased motion at L hip   Number of Reps Other reps (comment)   additional 5 reps throughout session   Comments Pt does nice job keeping R foot on floor, for knee flexion into sitting, but continues to have decreased weight through RLE with sit>stand, keeping RLE extended at ground.      Ambulation/Gait   Ambulation/Gait Yes    Ambulation/Gait Assistance 6: Modified independent (Device/Increase time)    Ambulation Distance (Feet) 50 Feet   x 2; 20   Assistive device Rolling walker    Gait Pattern Step-to pattern;Decreased stance time - right    Ambulation Surface Level;Indoor      Standardized Balance Assessment   Standardized Balance Assessment Timed Up and Go Test      Timed Up and Go Test   TUG Normal TUG    Normal TUG (seconds) 75.9   wtih RW     Knee/Hip Exercises: Seated   Long Arc Quad Strengthening;Right;Left;10 reps;2 sets    Other Seated Knee/Hip Exercises Ankle pumps 2 x 10 reps    Marching Right;Left;10 reps;2 sets                       PT Short Term Goals - 12/11/21 1407       PT SHORT  TERM GOAL #1   Title  Patient will be independent in HEP to improve functional outcomes    Baseline poor recall; reports not consistently doing    Time 4    Period Weeks    Status Not Met    Target Date 11/23/21      PT SHORT TERM GOAL #2   Title Demonstrate right hip flexion to 90 degrees and right knee flexion to 100 degrees to improve body mechanics for transfers    Baseline 70 degrees hip flexion, 92 right knee flexion-- no change; 12/11/2021-R hip flexion in sitting 85 degrees    Time 4    Period Weeks    Status Not Met    Target Date 11/23/21      PT SHORT TERM GOAL #3   Title Manifest improved BLE strength per time of 30 sec 5xSTS test    Baseline 38.56" from elevated EOM; 59.44 sec 18" seat dependent on BUE support from arm rests; 54.84 sec 12/11/2021    Time 4    Period Weeks    Status Not Met    Target Date 11/23/21               PT Long Term Goals - 12/11/21 1335       PT LONG TERM GOAL #1   Title Demo reduced risk for falls per TUG test time 20 sec    Baseline 59.03 sec; 12/11/21: 75.90, 75.34 sec    Time 8    Period Weeks    Status On-going    Target Date 12/21/21      PT LONG TERM GOAL #2   Title Manifest improved BLE strength per time 25 sec 5xSTS    Baseline 54.84 sec 12/11/21    Time 8    Period Weeks    Status On-going    Target Date 12/21/21                   Plan - 12/11/21 1332     Clinical Impression Statement 10th Visit Progress Report:  Subjectively, pt reports slight noticeable improvement in transfers and balance.  She reports R hip and leg is always hurting at about 7/10 and limits her movements.  Assessed STGs, with pt improving slightly on 5x sit<>stand to 54.84 seconds, just not to goal level.  TUG scores are more than at eval, with TUG score >75 seconds today (2 trials).  3 of 3 STGs not met.  Pt progressing towards LTGs, but she continues to be limiting with TUG and 5x sit<>Stand scores due to limitations in flexibility of R  hip and R knee with transfers.  She will continue to benefit from skilled PT to educate family/patient in optimal exercise program for strength, balance, funcitonal mobility to decrease fall risk and improve overall functional mobility.    Personal Factors and Comorbidities Age;Comorbidity 3+;Time since onset of injury/illness/exacerbation    Comorbidities right LE orthopedics, COPD, cardiac conditions    Examination-Activity Limitations Carry;Dressing;Lift;Toileting;Stand;Stairs;Squat;Locomotion Level;Transfers    Examination-Participation Restrictions Cleaning;Community Activity;Driving;Yard Work;Occupation;Meal Prep    Stability/Clinical Decision Making Evolving/Moderate complexity    Rehab Potential Fair    PT Frequency 2x / week    PT Duration 8 weeks    PT Treatment/Interventions ADLs/Self Care Home Management;Aquatic Therapy;Electrical Stimulation;DME Instruction;Gait training;Stair training;Functional mobility training;Therapeutic activities;Therapeutic exercise;Balance training;Neuromuscular re-education;Manual techniques;Passive range of motion;Dry needling;Energy conservation;Joint Manipulations;Vestibular;Taping;Patient/family education    PT Next Visit Plan Pt has 2 visits scheduled and 6/22 is end of current POC; review/update HEP and discuss POC beyond next week.  May need to educate pt and family on HEP performance    PT Home Exercise Plan [id number] seated knee flexion AAROM, hip add squeeze, standing with LLE elevated on 6" step, sidestepping, heel slides with strap    Consulted and Agree with Plan of Care Patient    Family Member Consulted --             Patient will benefit from skilled therapeutic intervention in order to improve the following deficits and impairments:  Abnormal gait, Decreased activity tolerance, Decreased balance, Decreased mobility, Decreased range of motion, Decreased strength, Difficulty walking, Impaired flexibility, Improper body mechanics,  Pain  Visit Diagnosis: Unsteadiness on feet  Other abnormalities of gait and mobility  Muscle weakness (generalized)     Problem List Patient Active Problem List   Diagnosis Date Noted   Palliative care encounter 07/27/2021   Constipation 07/27/2021   Cricopharyngeal achalasia 06/24/2021   Disorder of cervical esophageal region 06/23/2021   Pressure injury of skin 06/21/2021   Physical deconditioning 06/21/2021   SOB (shortness of breath) 06/18/2021   Acute on chronic respiratory failure with hypoxia (HCC) 06/18/2021   Chronic diastolic CHF (congestive heart failure) ([place]) 06/18/2021   Chronic anemia 06/18/2021   COVID-19 virus infection 06/17/2021   Chronic pain of right knee 06/20/2020   Abnormality of gait 06/20/2020   Malnutrition of moderate degree 02/25/2020   S/P percutaneous endoscopic gastrostomy (PEG) tube placement (HCC)    Post-operative pain    Subtherapeutic international normalized ratio (INR)    Debility 02/17/2020   Mesenteric ischemia (HCC)    Sepsis ([place]) 02/08/2020   Small bowel ischemia ([place]) 02/08/2020   Thrombocytosis 02/08/2020   Abnormal CT of the abdomen    Periprosthetic fracture around internal prosthetic hip joint 11/16/2019   Periprosthetic fracture around internal prosthetic hip joint, initial encounter    Chronic obstructive pulmonary disease (HCC)    Essential hypertension    Chronic anticoagulation    Acute blood loss anemia    Transaminitis    Hypoalbuminemia due to protein-calorie malnutrition (HCC)    Labile blood pressure    Drug induced constipation    Postoperative pain    Subcapital fracture of femur ([place]) 10/30/2019   Post-operative state    Prerenal azotemia    Leukocytosis    Closed right hip fracture, initial encounter ([place]) 10/27/2019   Hoarseness 02/19/2018   Sensorineural hearing loss (SNHL) of both ears 02/19/2018   Tinnitus, bilateral 02/19/2018   Cough 09/06/2017   Cough variant asthma 08/19/2017   Acute  bronchitis 02/20/2016   Pulmonary air trapping 01/04/2016   Encounter for preoperative pulmonary examination 01/04/2016   NSIP (nonspecific interstitial pneumonia) ([place]) 09/26/2015   Chronic sinusitis 09/26/2015   ILD (interstitial lung disease) ([place]) 07/12/2014   Upper airway cough syndrome 09/23/2013   Encounter for therapeutic drug monitoring 08/11/2013   Congestive heart failure ([place]) 07/08/2013   Coronary artery calcification seen on CAT scan 03/15/2013   Nodule of left lung 03/15/2013   Orthostatic lightheadedness 02/09/2013   Depression    Diverticulitis 01/01/2012   GERD (gastroesophageal reflux disease) 01/01/2012   Hypothyroid 11/23/2010   Edema 10/04/2010   Bronchiectasis ([place])    HYPERTENSION, BENIGN 10/22/2008   A-fib ([place])     Skyylar Kopf W., PT 12/11/2021, 2:13 PM  [facility] [address], [place], [place], [address] Phone: [phone]   Fax:  [phone]  Name: IDONNA HEEREN MRN: [id number] Date of Birth: [birth year]

## 2021-12-11 NOTE — Therapy (Signed)
OUTPATIENT OCCUPATIONAL THERAPY TREATMENT NOTE   Patient Name: Stephanie Cortez MRN: [id number] DOB:28-Jan-1934, 88 y.o., female Today's Date: 12/11/2021  PCP: [name], MD  REFERRING PROVIDER: Brand Males, MD   OCCUPATIONAL THERAPY DISCHARGE SUMMARY  Visits from Start of Care: 9  Current functional level related to goals / functional outcomes: Pt currently Mod I with bathing and dressing tasks, toilet transfers, Supervision with shower transfers due to fear of falling.  Pt is able to utilize RW for mobility in home and use of AE for increased independence with LB dressing and retrieving items from floor.  Pt continues to benefit from encouragement due to frustration with perceived lack of progress and need for AE/AD for independence.   Remaining deficits: Decreased hip flexion as needed for sit > stand and functional reach during ADLs and IADLs. Decreased activity tolerance.   Education / Equipment: Provided education and handouts on energy conservation strategies and HEP with theraband for strengthening/endurance.     Patient agrees to discharge. Patient goals were met. Patient is being discharged due to meeting the stated rehab goals..      END OF SESSION:   OT End of Session - 12/11/21 1408     Visit Number 9    Number of Visits 13    Date for OT Re-Evaluation 12/15/21    Authorization Type Medicare A & B    Authorization - Visit Number 9    Progress Note Due on Visit 10    OT Start Time 1402    OT Stop Time 1445    OT Time Calculation (min) 43 min    Activity Tolerance Patient tolerated treatment well    Behavior During Therapy WFL for tasks assessed/performed               Past Medical History:  Diagnosis Date   Anemia    - Hgb 9.7gm% on 07/13/2008 in [place] 129gm% wiht normal irone levsl and ferritin 10/27/2008 in [place] Recurrent otitis/sinusitis   Anxiety    chronic BZ prn   Atrial fibrillation (HCC)    chronic anticoag    Bronchiectasis    >PFT 07/13/2008 in [place] 1.9L/76%, FVC 2.45L/74, Ratio 79, TLC 121%, DLCO 64%  AE BRonchiectasis - Dec 2010.New Rx:  outpatient - Feb 2011 - Rx outpatient   CHF (congestive heart failure) ([place])    COPD (chronic obstructive pulmonary disease) (HCC)    bronchiectasis   Cricopharyngeal achalasia    Depression    Diverticulosis    Dyslipidemia    Eczema    Fatty liver    GERD with stricture    Glaucoma    H. 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[place])    Ventral hernia    Vertigo    Wears glasses    Past Surgical History:  Procedure Laterality Date   BOWEL RESECTION N/A 02/08/2020   Procedure: SMALL BOWEL RESECTION;  Surgeon: Donnie Mesa, MD;  Location: [facility];  Service: General;  Laterality: N/A;   BREAST SURGERY     br bx   CARDIAC CATHETERIZATION  07/01/2013   CATARACT EXTRACTION     both   COLONOSCOPY     ESOPHAGOSCOPY W/ BOTOX INJECTION  12/11/2011   Procedure: ESOPHAGOSCOPY WITH BOTOX INJECTION;  Surgeon: Rozetta Nunnery, MD;  Location: [facility];  Service: ENT;  Laterality: N/A;  esophogoscopy with dilation, botox injection   FOOT SURGERY  03/14/2011   gastroc slide-rt   GASTROSTOMY N/A 02/08/2020   Procedure: INSERTION OF GASTROSTOMY TUBE;  Surgeon: Donnie Mesa, MD;  Location: [facility];  Service: General;  Laterality: N/A;   HIP ARTHROPLASTY Right 10/28/2019   Procedure: ARTHROPLASTY BIPOLAR HIP (HEMIARTHROPLASTY);  Surgeon: Marybelle Killings, MD;  Location: [facility];  Service: Orthopedics;  Laterality: Right;   LAPAROTOMY N/A 02/08/2020   Procedure: EXPLORATORY LAPAROTOMY;  Surgeon: Donnie Mesa, MD;  Location: [facility];  Service: General;  Laterality: N/A;   LYSIS OF ADHESION N/A 02/08/2020   Procedure: LYSIS OF ADHESIONS;  Surgeon: Donnie Mesa, MD;  Location: [facility];   Service: General;  Laterality: N/A;   TOTAL ABDOMINAL HYSTERECTOMY     TOTAL HIP REVISION Right 11/16/2019   Procedure: TOTAL HIP REVISION BIPOLAR TO CEMENTED BIPOLAR;  Surgeon: Marybelle Killings, MD;  Location: [facility];  Service: Orthopedics;  Laterality: Right;   WISDOM TOOTH EXTRACTION     Patient Active Problem List   Diagnosis Date Noted   Palliative care encounter 07/27/2021   Constipation 07/27/2021   Cricopharyngeal achalasia 06/24/2021   Disorder of cervical esophageal region 06/23/2021   Pressure injury of skin 06/21/2021   Physical deconditioning 06/21/2021   SOB (shortness of breath) 06/18/2021   Acute on chronic respiratory failure with hypoxia (HCC) 06/18/2021   Chronic diastolic CHF (congestive heart failure) ([place]) 06/18/2021   Chronic anemia 06/18/2021   COVID-19 virus infection 06/17/2021   Chronic pain of right knee 06/20/2020   Abnormality of gait 06/20/2020   Malnutrition of moderate degree 02/25/2020   S/P percutaneous endoscopic gastrostomy (PEG) tube placement (HCC)    Post-operative pain    Subtherapeutic international normalized ratio (INR)    Debility 02/17/2020   Mesenteric ischemia (HCC)    Sepsis ([place]) 02/08/2020   Small bowel ischemia ([place]) 02/08/2020   Thrombocytosis 02/08/2020   Abnormal CT of the abdomen    Periprosthetic fracture around internal prosthetic hip joint 11/16/2019   Periprosthetic fracture around internal prosthetic hip joint, initial encounter    Chronic obstructive pulmonary disease (HCC)    Essential hypertension    Chronic anticoagulation    Acute blood loss anemia    Transaminitis    Hypoalbuminemia due to protein-calorie malnutrition ([place])    Labile blood pressure    Drug induced constipation    Postoperative pain    Subcapital fracture of femur ([place]) 10/30/2019   Post-operative state    Prerenal azotemia    Leukocytosis    Closed right hip fracture, initial encounter ([place]) 10/27/2019   Hoarseness 02/19/2018   Sensorineural  hearing loss (SNHL) of both ears 02/19/2018   Tinnitus, bilateral 02/19/2018   Cough 09/06/2017   Cough variant asthma 08/19/2017   Acute bronchitis 02/20/2016   Pulmonary air trapping 01/04/2016   Encounter for preoperative pulmonary examination 01/04/2016   NSIP (nonspecific interstitial pneumonia) ([place]) 09/26/2015   Chronic sinusitis 09/26/2015   ILD (interstitial lung disease) ([place]) 07/12/2014   Upper airway cough syndrome 09/23/2013   Encounter for therapeutic drug monitoring 08/11/2013   Congestive heart failure ([place]) 07/08/2013   Coronary artery calcification seen on CAT scan 03/15/2013   Nodule of left lung 03/15/2013   Orthostatic lightheadedness 02/09/2013   Depression    Diverticulitis 01/01/2012   GERD (gastroesophageal reflux disease) 01/01/2012   Hypothyroid 11/23/2010   Edema 10/04/2010  Bronchiectasis ([place])    HYPERTENSION, BENIGN 10/22/2008   A-fib ([place])     ONSET DATE: 10/06/2021 (referral date)   REFERRING DIAG: R53.81 (ICD-10-CM) - Other malaise J84.9 (ICD-10-CM) - Interstitial pulmonary disease, unspecified   THERAPY DIAG:  Muscle weakness (generalized)  Unsteadiness on feet  Other abnormalities of gait and mobility  PERTINENT HISTORY: R hip fx s/p THA and required revision, COPD, A-fib, CHF   PRECAUTIONS: Fall  SUBJECTIVE:   SUBJECTIVE STATEMENT: "I'd love to say that I can get the pain down to a 5 or 6, but it's always a 7 or 8."  PAIN: Are you having pain? Yes.  7/10 in R thigh/hip.   OBJECTIVE:   UE ROM      Active ROM Right 10/31/2021 Left 10/31/2021 Right 12/11/21 Left 12/11/21  Shoulder flexion 140 135 135 140  Shoulder abduction        Shoulder adduction        Shoulder extension        Shoulder internal rotation WNL diminished WNL WNL  Shoulder external rotation WNL diminished WNL WNL  Elbow flexion WNL WNL WNL WNL  Elbow extension WNL WNL WNL WNL  Wrist flexion        Wrist extension        Wrist ulnar deviation         Wrist radial deviation        Wrist pronation        Wrist supination        (Blank rows = not tested)     UE MMT:      MMT Right 10/31/2021 Left 10/31/2021 Right 12/11/21 Left  12/11/21  Shoulder flexion 4+/5 4+/5 4+/5 4+/5  Shoulder abduction        Shoulder adduction        Shoulder extension        Shoulder internal rotation        Shoulder external rotation        Middle trapezius        Lower trapezius        Elbow flexion 4+/5 4+/5 4+/5 4+/5  Elbow extension 4+/5 4+/5 4+/5 4+/5  Wrist flexion        Wrist extension        Wrist ulnar deviation        Wrist radial deviation        Wrist pronation        Wrist supination        (Blank rows = not tested)  HAND FUNCTION: 10/31/21: Grip strength: Right: 36 lbs; Left: 24 lbs, Lateral pinch: Right: 8 lbs, Left: 6 lbs, and 3 point pinch: Right: 8 lbs, Left: 5 lbs 12/11/21: Grip strength: Right: 42 lbs; Left: 30 lbs, Lateral pinch: Right: 8 lbs, Left: 7 lbs, and 3 point pinch: Right: 9 lbs, Left: 6 lbs   TODAY'S TREATMENT: Engaged in dynamic standing with focus on retrieving items from high and low cabinet heights to simulate home making tasks with retrieving items from cabinets, refrigerators, and oven.  Pt demonstrating improved ability to reach into cabinet with RUE, still demonstrating difficulty reaching to moderate-high height range with LUE.  Engaged in reaching to lower cabinets with pt reaching to side due to decreased ability to reach forward due to decreased hip flexion.  Pt tolerated standing 3 mins each time before requiring prolonged seated rest break.   Engaged in sit > stand throughout session with pt able to complete without physical assistance or verbal  cues.  Pt demonstrating improvements with hip flexion to place RLE underneath self during sit > stand instead of out in front as she would initially.  Pt reports only sitting in arm chairs at home and having a bar next to bed to pull up on and BSC over toilet to provide  UE support.   Reviewed energy conservation strategies and discussed Mod I level with self-care tasks with use of AE and RW.  Therapist reiterating progress towards goals as well as current status of living alone with ability to complete BADLs and mobility, whereas some other peers may be living in assisted living or with caregivers.      PATIENT EDUCATION: Education details: Educated on transitional movements and anterior and lateral weight shifting; reiterated current level of independence with use of AE and RW vs requiring physical assistance for BADLs. Person educated: Patient and Child(ren) Education method: Explanation Education comprehension: verbalized understanding    HOME EXERCISE PROGRAM: Access Code: [email or web address]     GOALS: Goals reviewed with patient? Yes   SHORT TERM GOALS: Target date: 11/24/2021   Pt will be independent in HEP to address BUE strengthening and conditioning to increase independence with ADLs and IADLs Baseline: Goal status: partially met   2.  Pt will verbalize energy conservation strategies and provide 2 examples that she is incorporating to increase her safety/independence with ADLs and IADLs. Baseline:  Goal status: partially met     LONG TERM GOALS: Target date:  12/15/21   Pt will demonstrate improved strength in BUE to open refrigerator door with increased ease and no increase in pain. Baseline: R: 36 and L: 24 Goal status: Achieved - 12/11/21   2.  Pt will be able to demonstrate safe strategy to pick items up from the floor with/without use of AE as appropriate. Baseline:  Goal status: Achieved - 12/11/21   3.  Pt will demonstrate improved ROM in BUE to allow for increased ease with obtaining items from shelves in cabinets and refrigerator. Baseline: Shoulder flexion L:135 and R: 140 Goal status: partially met  - pt able to reach greater heights with RUE to remove items from moderate height range and pt with increased difficulty retrieving  items at same height with LUE.   4.  Pt will demonstrate improved ability to complete sit > stand from various height surfaces to increase independence with functional transfers without cues or assistance. Baseline:  Goal status: Achieved - 12/11/21     ASSESSMENT:   CLINICAL IMPRESSION: Pt making progress with carryover of RLE placement during sit <> stand as well as improved independence with sit > stand.  Pt is able to report and incorporate energy conservation strategies into ADLs and IADLs to allow for improved engagement in tasks despite decreased endurance.  Pt demonstrating increased BUE strength to grasp and open items.  Pt reports understanding of recommendations to d/c from OT at this time and continue to focus on mobility and endurance during PT sessions.     PERFORMANCE DEFICITS in functional skills including ADLs, IADLs, dexterity, ROM, strength, pain, flexibility, GMC, mobility, balance, body mechanics, endurance, cardiopulmonary status limiting function, decreased knowledge of precautions, decreased knowledge of use of DME, and UE functional use and psychosocial skills including environmental adaptation.    IMPAIRMENTS are limiting patient from ADLs and IADLs.    COMORBIDITIES may have co-morbidities  that affects occupational performance. Patient will benefit from skilled OT to address above impairments and improve overall function.      PLAN: OT  FREQUENCY: 2x/week   OT DURATION: 6 weeks   PLANNED INTERVENTIONS: self care/ADL training, therapeutic exercise, therapeutic activity, neuromuscular re-education, manual therapy, passive range of motion, balance training, functional mobility training, patient/family education, psychosocial skills training, energy conservation, coping strategies training, and DME and/or AE instructions   RECOMMENDED OTHER SERVICES: NA   CONSULTED AND AGREED WITH PLAN OF CARE: Patient and family member/caregiver   PLAN FOR NEXT SESSION: D/C     Simonne Come, OTR/L 12/11/2021, 2:21 PM

## 2021-12-11 NOTE — Telephone Encounter (Signed)
Prescription refill request for Eliquis received. Indication: Afib  Last office visit: 02/10/21 Caryl Comes)  Scr: 0.61 (06/27/21)  Age: 88 Weight: 69.8kg   Per dosing criteria, pt should be on '5mg'$  BID. Message sent to pharmacist to review.

## 2021-12-12 ENCOUNTER — Ambulatory Visit
Admission: RE | Admit: 2021-12-12 | Discharge: 2021-12-12 | Disposition: A | Discharge status: Home / Self Care | Payer: Medicare Other | Source: Ambulatory Visit | Attending: Internal Medicine | Admitting: Internal Medicine

## 2021-12-12 DIAGNOSIS — J849 Interstitial pulmonary disease, unspecified: Secondary | ICD-10-CM | POA: Diagnosis not present

## 2021-12-12 DIAGNOSIS — J479 Bronchiectasis, uncomplicated: Secondary | ICD-10-CM | POA: Diagnosis not present

## 2021-12-12 DIAGNOSIS — I700 Atherosclerosis of aorta: Secondary | ICD-10-CM | POA: Diagnosis not present

## 2021-12-12 DIAGNOSIS — R918 Other nonspecific abnormal finding of lung field: Secondary | ICD-10-CM | POA: Diagnosis not present

## 2021-12-13 ENCOUNTER — Telehealth: Payer: Self-pay | Admitting: Internal Medicine

## 2021-12-13 NOTE — Telephone Encounter (Signed)
Left message for pt to call back  °

## 2021-12-14 ENCOUNTER — Ambulatory Visit: Payer: Medicare Other

## 2021-12-14 ENCOUNTER — Encounter: Payer: Medicare Other | Admitting: Occupational Therapy

## 2021-12-15 NOTE — Telephone Encounter (Signed)
Never received a call back. Will await further communication from pt/pts family.

## 2021-12-19 ENCOUNTER — Ambulatory Visit: Payer: Medicare Other

## 2021-12-19 DIAGNOSIS — R262 Difficulty in walking, not elsewhere classified: Secondary | ICD-10-CM

## 2021-12-19 DIAGNOSIS — M6281 Muscle weakness (generalized): Secondary | ICD-10-CM | POA: Diagnosis not present

## 2021-12-19 DIAGNOSIS — R2681 Unsteadiness on feet: Secondary | ICD-10-CM

## 2021-12-19 DIAGNOSIS — R2689 Other abnormalities of gait and mobility: Secondary | ICD-10-CM

## 2021-12-19 NOTE — Therapy (Signed)
[facility] [address], [place], [place], [address] Phone: [phone]   Fax:  [phone]  Physical Therapy Treatment  Patient Details  Name: Stephanie Cortez MRN: [id number] Date of Birth: 1933/12/19 Referring Provider (PT): Brand Males, MD   Encounter Date: 12/19/2021   PT End of Session - 12/19/21 1321     Visit Number 11    Number of Visits 16    Date for PT Re-Evaluation 12/21/21    Authorization Type Medicare A and B/ UHC Supplemental 2023    Progress Note Due on Visit 20    PT Start Time 1320    PT Stop Time 1400    PT Time Calculation (min) 40 min    Activity Tolerance Patient tolerated treatment well    Behavior During Therapy [facility] for tasks assessed/performed             Past Medical History:  Diagnosis Date   Anemia    - Hgb 9.7gm% on 07/13/2008 in [place] 129gm% wiht normal irone levsl and ferritin 10/27/2008 in [place] Recurrent otitis/sinusitis   Anxiety    chronic BZ prn   Atrial fibrillation ([place])    chronic anticoag   Bronchiectasis    >PFT 07/13/2008 in [place] 1.9L/76%, FVC 2.45L/74, Ratio 79, TLC 121%, DLCO 64%  AE BRonchiectasis - Dec 2010.New Rx:  outpatient - Feb 2011 - Rx outpatient   CHF (congestive heart failure) ([place])    COPD (chronic obstructive pulmonary disease) (HCC)    bronchiectasis   Cricopharyngeal achalasia    Depression    Diverticulosis    Dyslipidemia    Eczema    Fatty liver    GERD with stricture    Glaucoma    H. 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[place])    Ventral hernia    Vertigo    Wears glasses     Past Surgical History:  Procedure Laterality Date   BOWEL RESECTION N/A 02/08/2020   Procedure: SMALL BOWEL RESECTION;  Surgeon: Donnie Mesa, MD;  Location: [place];  Service: General;   Laterality: N/A;   BREAST SURGERY     br bx   CARDIAC CATHETERIZATION  07/01/2013   CATARACT EXTRACTION     both   COLONOSCOPY     ESOPHAGOSCOPY W/ BOTOX INJECTION  12/11/2011   Procedure: ESOPHAGOSCOPY WITH BOTOX INJECTION;  Surgeon: Rozetta Nunnery, MD;  Location: [place];  Service: ENT;  Laterality: N/A;  esophogoscopy with dilation, botox injection   FOOT SURGERY  03/14/2011   gastroc slide-rt   GASTROSTOMY N/A 02/08/2020   Procedure: INSERTION OF GASTROSTOMY TUBE;  Surgeon: Donnie Mesa, MD;  Location: [place];  Service: General;  Laterality: N/A;   HIP ARTHROPLASTY Right 10/28/2019   Procedure: ARTHROPLASTY BIPOLAR HIP (HEMIARTHROPLASTY);  Surgeon: Marybelle Killings, MD;  Location: [facility];  Service: Orthopedics;  Laterality: Right;   LAPAROTOMY N/A 02/08/2020   Procedure: EXPLORATORY LAPAROTOMY;  Surgeon: Donnie Mesa, MD;  Location: [place];  Service: General;  Laterality: N/A;   LYSIS OF ADHESION N/A 02/08/2020   Procedure: LYSIS OF ADHESIONS;  Surgeon: Donnie Mesa, MD;  Location: [place];  Service: General;  Laterality: N/A;   TOTAL ABDOMINAL HYSTERECTOMY  TOTAL HIP REVISION Right 11/16/2019   Procedure: TOTAL HIP REVISION BIPOLAR TO CEMENTED BIPOLAR;  Surgeon: Marybelle Killings, MD;  Location: [place];  Service: Orthopedics;  Laterality: Right;   WISDOM TOOTH EXTRACTION      There were no vitals filed for this visit.   Subjective Assessment - 12/19/21 1328     Subjective Did not feel like coming today, the weather has my hip hurting    Pertinent History right hip fx s/p THA and required revision, COPD, A-fib, CHF    Limitations Lifting;Standing;Walking;House hold activities    How long can you sit comfortably? 15-30 min    How long can you stand comfortably? varies    Patient Stated Goals decrease right hip pain, improve strength/balance/walking    Currently in Pain? Yes    Pain Score 7     Pain Location Hip    Pain Orientation Right    Pain Type Chronic pain     Pain Onset More than a month ago                                    Balance Exercises - 12/19/21 0001       Balance Exercises: Standing   Standing Eyes Opened Narrow base of support (BOS);Foam/compliant surface;30 secs;5 reps    Standing Eyes Closed Wide (BOA);Foam/compliant surface;10 secs;5 reps    Standing, One Foot on a Step Eyes open;8 inch;4 reps;15 secs   UE support   Retro Gait 1 rep   2 min parallel bars   Sidestepping 1 rep   2 min   Turning Both;Other reps (comment)   6 reps, at least unilat support needed   Heel Raises Both;20 reps    Toe Raise Both;20 reps                  PT Short Term Goals - 12/11/21 1407       PT SHORT TERM GOAL #1   Title Patient will be independent in HEP to improve functional outcomes    Baseline poor recall; reports not consistently doing    Time 4    Period Weeks    Status Not Met    Target Date 11/23/21      PT SHORT TERM GOAL #2   Title Demonstrate right hip flexion to 90 degrees and right knee flexion to 100 degrees to improve body mechanics for transfers    Baseline 70 degrees hip flexion, 92 right knee flexion-- no change; 12/11/2021-R hip flexion in sitting 85 degrees    Time 4    Period Weeks    Status Not Met    Target Date 11/23/21      PT SHORT TERM GOAL #3   Title Manifest improved BLE strength per time of 30 sec 5xSTS test    Baseline 38.56" from elevated EOM; 59.44 sec 18" seat dependent on BUE support from arm rests; 54.84 sec 12/11/2021    Time 4    Period Weeks    Status Not Met    Target Date 11/23/21               PT Long Term Goals - 12/11/21 1335       PT LONG TERM GOAL #1   Title Demo reduced risk for falls per TUG test time 20 sec    Baseline 59.03 sec; 12/11/21: 75.90, 75.34 sec    Time 8  Period Weeks    Status On-going    Target Date 12/21/21      PT LONG TERM GOAL #2   Title Manifest improved BLE strength per time 25 sec 5xSTS    Baseline 54.84 sec  12/11/21    Time 8    Period Weeks    Status On-going    Target Date 12/21/21                   Plan - 12/19/21 1409     Clinical Impression Statement Continues to exhibit reduced RLE weight acceptance in static standing and ROM restrictions limiting mobility requiring compensatory strategies for transfers due to limited right hip flexion and somewhat self-limited knee flexion ROM. Demo tendency for retro and right LOB when standing on compliant surfaces without UE support, more pronounced with eyes closed demand.  Continued sessions to progress HEP for increased self-efficacy. Recertification due at next session    Personal Factors and Comorbidities Age;Comorbidity 3+;Time since onset of injury/illness/exacerbation    Comorbidities right LE orthopedics, COPD, cardiac conditions    Examination-Activity Limitations Carry;Dressing;Lift;Toileting;Stand;Stairs;Squat;Locomotion Level;Transfers    Examination-Participation Restrictions Cleaning;Community Activity;Driving;Yard Work;Occupation;Meal Prep    Stability/Clinical Decision Making Evolving/Moderate complexity    Rehab Potential Fair    PT Frequency 2x / week    PT Duration 8 weeks    PT Treatment/Interventions ADLs/Self Care Home Management;Aquatic Therapy;Electrical Stimulation;DME Instruction;Gait training;Stair training;Functional mobility training;Therapeutic activities;Therapeutic exercise;Balance training;Neuromuscular re-education;Manual techniques;Passive range of motion;Dry needling;Energy conservation;Joint Manipulations;Vestibular;Taping;Patient/family education    PT Next Visit Plan Pt has 2 visits scheduled and 6/22 is end of current POC; review/update HEP and discuss POC beyond next week.  May need to educate pt and family on HEP performance    PT Home Exercise Plan [id number] seated knee flexion AAROM, hip add squeeze, standing with LLE elevated on 6" step, sidestepping, heel slides with strap    Consulted and Agree with  Plan of Care Patient             Patient will benefit from skilled therapeutic intervention in order to improve the following deficits and impairments:  Abnormal gait, Decreased activity tolerance, Decreased balance, Decreased mobility, Decreased range of motion, Decreased strength, Difficulty walking, Impaired flexibility, Improper body mechanics, Pain  Visit Diagnosis: Muscle weakness (generalized)  Unsteadiness on feet  Other abnormalities of gait and mobility  Difficulty in walking, not elsewhere classified     Problem List Patient Active Problem List   Diagnosis Date Noted   Palliative care encounter 07/27/2021   Constipation 07/27/2021   Cricopharyngeal achalasia 06/24/2021   Disorder of cervical esophageal region 06/23/2021   Pressure injury of skin 06/21/2021   Physical deconditioning 06/21/2021   SOB (shortness of breath) 06/18/2021   Acute on chronic respiratory failure with hypoxia (HCC) 06/18/2021   Chronic diastolic CHF (congestive heart failure) ([place]) 06/18/2021   Chronic anemia 06/18/2021   COVID-19 virus infection 06/17/2021   Chronic pain of right knee 06/20/2020   Abnormality of gait 06/20/2020   Malnutrition of moderate degree 02/25/2020   S/P percutaneous endoscopic gastrostomy (PEG) tube placement (HCC)    Post-operative pain    Subtherapeutic international normalized ratio (INR)    Debility 02/17/2020   Mesenteric ischemia (HCC)    Sepsis ([place]) 02/08/2020   Small bowel ischemia ([place]) 02/08/2020   Thrombocytosis 02/08/2020   Abnormal CT of the abdomen    Periprosthetic fracture around internal prosthetic hip joint 11/16/2019   Periprosthetic fracture around internal prosthetic hip joint, initial encounter  Chronic obstructive pulmonary disease (HCC)    Essential hypertension    Chronic anticoagulation    Acute blood loss anemia    Transaminitis    Hypoalbuminemia due to protein-calorie malnutrition (HCC)    Labile blood pressure    Drug  induced constipation    Postoperative pain    Subcapital fracture of femur ([place]) 10/30/2019   Post-operative state    Prerenal azotemia    Leukocytosis    Closed right hip fracture, initial encounter ([place]) 10/27/2019   Hoarseness 02/19/2018   Sensorineural hearing loss (SNHL) of both ears 02/19/2018   Tinnitus, bilateral 02/19/2018   Cough 09/06/2017   Cough variant asthma 08/19/2017   Acute bronchitis 02/20/2016   Pulmonary air trapping 01/04/2016   Encounter for preoperative pulmonary examination 01/04/2016   NSIP (nonspecific interstitial pneumonia) ([place]) 09/26/2015   Chronic sinusitis 09/26/2015   ILD (interstitial lung disease) ([place]) 07/12/2014   Upper airway cough syndrome 09/23/2013   Encounter for therapeutic drug monitoring 08/11/2013   Congestive heart failure ([place]) 07/08/2013   Coronary artery calcification seen on CAT scan 03/15/2013   Nodule of left lung 03/15/2013   Orthostatic lightheadedness 02/09/2013   Depression    Diverticulitis 01/01/2012   GERD (gastroesophageal reflux disease) 01/01/2012   Hypothyroid 11/23/2010   Edema 10/04/2010   Bronchiectasis ([place])    HYPERTENSION, BENIGN 10/22/2008   A-fib ([place])     Toniann Fail, PT 12/19/2021, 2:11 PM  [facility] [address] [place], [place], [address] Phone: [phone]   Fax:  [phone]  Name: MAKENZE ELLETT MRN: [id number] Date of Birth: 30-Jul-1933

## 2021-12-21 ENCOUNTER — Ambulatory Visit: Payer: Medicare Other

## 2021-12-21 DIAGNOSIS — R262 Difficulty in walking, not elsewhere classified: Secondary | ICD-10-CM | POA: Diagnosis not present

## 2021-12-21 DIAGNOSIS — R2681 Unsteadiness on feet: Secondary | ICD-10-CM | POA: Diagnosis not present

## 2021-12-21 DIAGNOSIS — R2689 Other abnormalities of gait and mobility: Secondary | ICD-10-CM | POA: Diagnosis not present

## 2021-12-21 DIAGNOSIS — M6281 Muscle weakness (generalized): Secondary | ICD-10-CM

## 2021-12-21 NOTE — Therapy (Signed)
[facility] [address] [place], [place], [address] Phone: [phone]   Fax:  [phone]  Physical Therapy Treatment, Progress Note, Recertification  Patient Details  Name: Stephanie Cortez MRN: [id number] Date of Birth: [birth year] Referring Provider (PT): Brand Males, MD  Progress Note Reporting Period 12/11/21 to 12/21/21  See note below for Objective Data and Assessment of Progress/Goals.     Encounter Date: 12/21/2021   PT End of Session - 12/21/21 1524     Visit Number 12    Number of Visits 21    Date for PT Re-Evaluation 01/18/22    Authorization Type Medicare A and B/ UHC Supplemental 2023    Progress Note Due on Visit 53    PT Start Time 1450    PT Stop Time 1530    PT Time Calculation (min) 40 min    Activity Tolerance Patient tolerated treatment well    Behavior During Therapy WFL for tasks assessed/performed             Past Medical History:  Diagnosis Date   Anemia    - Hgb 9.7gm% on 07/13/2008 in [place] 129gm% wiht normal irone levsl and ferritin 10/27/2008 in [place] Recurrent otitis/sinusitis   Anxiety    chronic BZ prn   Atrial fibrillation (HCC)    chronic anticoag   Bronchiectasis    >PFT 07/13/2008 in [place] 1.9L/76%, FVC 2.45L/74, Ratio 79, TLC 121%, DLCO 64%  AE BRonchiectasis - Dec 2010.New Rx:  outpatient - Feb 2011 - Rx outpatient   CHF (congestive heart failure) ([place])    COPD (chronic obstructive pulmonary disease) (HCC)    bronchiectasis   Cricopharyngeal achalasia    Depression    Diverticulosis    Dyslipidemia    Eczema    Fatty liver    GERD with stricture    Glaucoma    H. 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[place])    Ventral hernia    Vertigo    Wears glasses     Past Surgical History:   Procedure Laterality Date   BOWEL RESECTION N/A 02/08/2020   Procedure: SMALL BOWEL RESECTION;  Surgeon: Donnie Mesa, MD;  Location: [place];  Service: General;  Laterality: N/A;   BREAST SURGERY     br bx   CARDIAC CATHETERIZATION  07/01/2013   CATARACT EXTRACTION     both   COLONOSCOPY     ESOPHAGOSCOPY W/ BOTOX INJECTION  12/11/2011   Procedure: ESOPHAGOSCOPY WITH BOTOX INJECTION;  Surgeon: Rozetta Nunnery, MD;  Location: [place];  Service: ENT;  Laterality: N/A;  esophogoscopy with dilation, botox injection   FOOT SURGERY  03/14/2011   gastroc slide-rt   GASTROSTOMY N/A 02/08/2020   Procedure: INSERTION OF GASTROSTOMY TUBE;  Surgeon: Donnie Mesa, MD;  Location: [place];  Service: General;  Laterality: N/A;   HIP ARTHROPLASTY Right 10/28/2019   Procedure: ARTHROPLASTY BIPOLAR HIP (HEMIARTHROPLASTY);  Surgeon: Marybelle Killings, MD;  [facility];  Service: Orthopedics;  Laterality: Right;   LAPAROTOMY N/A 02/08/2020   Procedure: EXPLORATORY LAPAROTOMY;  Surgeon: Donnie Mesa, MD;  Location: [place];  Service: General;  Laterality: N/A;   LYSIS OF ADHESION N/A 02/08/2020   Procedure:  LYSIS OF ADHESIONS;  Surgeon: Donnie Mesa, MD;  Location: [place];  Service: General;  Laterality: N/A;   TOTAL ABDOMINAL HYSTERECTOMY     TOTAL HIP REVISION Right 11/16/2019   Procedure: TOTAL HIP REVISION BIPOLAR TO CEMENTED BIPOLAR;  Surgeon: Marybelle Killings, MD;  Location: [place];  Service: Orthopedics;  Laterality: Right;   WISDOM TOOTH EXTRACTION      There were no vitals filed for this visit.   Subjective Assessment - 12/21/21 1517     Subjective Feeling the same, right hip remains limited    Pertinent History right hip fx s/p THA and required revision, COPD, A-fib, CHF    Limitations Lifting;Standing;Walking;House hold activities    How long can you sit comfortably? 15-30 min    How long can you stand comfortably? varies    Patient Stated Goals decrease right hip pain, improve  strength/balance/walking    Currently in Pain? Yes    Pain Score 7     Pain Location Hip    Pain Orientation Right    Pain Descriptors / Indicators Sore    Pain Type Chronic pain    Pain Onset More than a month ago                [facility] PT Assessment - 12/21/21 0001       Assessment   Medical Diagnosis ILD/Deconditioning    Referring Provider (PT) Brand Males, MD      AROM   Right Hip Flexion 80    Right Knee Flexion 92      Transfers   Five time sit to stand comments  56.12 sec   heavy reliance on BUE support and compensatory positioning for RLE due to lack of hip flexion ROM     Timed Up and Go Test   Normal TUG (seconds) 68                           [facility] Adult PT Treatment/Exercise - 12/21/21 0001       Ambulation/Gait   Ambulation/Gait Yes    Ambulation/Gait Assistance 6: Modified independent (Device/Increase time)    Ambulation Distance (Feet) 100 Feet    Assistive device Rolling walker    Gait Pattern Step-to pattern;Decreased stance time - right    Gait Comments cues in progressing step length, limited carryover      Knee/Hip Exercises: Seated   Heel Slides AAROM;Right;3 sets;10 reps    Heel Slides Limitations 5 sec stretch for right quad, achieves 90-95 degrees flexion    Abduction/Adduction  Strengthening;Both;10 reps;2 sets    Abd/Adduction Limitations red t-loop around ankles and then knees, using long-handled reacher to position loop                 Balance Exercises - 12/21/21 0001       Balance Exercises: Standing   Sidestepping 1 rep;Theraband    Theraband Level (Sidestepping) Level 2 (Red)   2 min               PT Education - 12/21/21 1637     Education Details education on HEP additions and implementing resistance loops for PRE as pt can don/doff these with long-handled reacher    Person(s) Educated Patient;Child(ren)    Methods Explanation;Demonstration    Comprehension Verbalized  understanding;Need further instruction              PT Short Term Goals - 12/21/21 1501  PT SHORT TERM GOAL #1   Title Patient will be independent in HEP to improve functional outcomes    Baseline poor recall; reports not consistently doing. Will reconfigure HEP    Time 4    Period Weeks    Status On-going    Target Date 11/23/21      PT SHORT TERM GOAL #2   Title Demonstrate right hip flexion to 90 degrees and right knee flexion to 100 degrees to improve body mechanics for transfers    Baseline 70 degrees hip flexion, 92 right knee flexion-- no change; 12/11/2021-R hip flexion in sitting 85 degrees    Time 4    Period Weeks    Status Not Met    Target Date 11/23/21      PT SHORT TERM GOAL #3   Title Manifest improved BLE strength per time of 30 sec 5xSTS test    Baseline 38.56" from elevated EOM; 59.44 sec 18" seat dependent on BUE support from arm rests; 54.84 sec 12/11/2021;    Time 4    Period Weeks    Status Not Met    Target Date 11/23/21               PT Long Term Goals - 12/21/21 1502       PT LONG TERM GOAL #1   Title Demo reduced risk for falls per TUG test time 20 sec    Baseline 59.03 sec; 12/11/21: 75.90, 75.34 sec; 68 sec on 12/21/21    Time 4    Period Weeks    Status On-going    Target Date 01/18/22      PT LONG TERM GOAL #2   Title Manifest improved BLE strength per time 25 sec 5xSTS    Baseline 54.84 sec 12/11/21; 56.12 sec on 12/21/21    Time 4    Period Weeks    Status On-going    Target Date 01/18/22                   Plan - 12/21/21 1638     Clinical Impression Statement Not much improvement in standardized measures with 5xSTS and TUG tests remaining increased in time due to interaction of conditions and limitations involving the right hip.  Continues to exhibit significant ROM and strength deficits in right LE hip>knee with very limited hip flexion which requires massive compensatory movements to perform transfers and  limits gait abilities with tendency to enact step-to gait pattern.  HEP has been refined and will continue to develop to demonstrate and progress for teach-back of LE PRE using resistance loops as patient is able to don/doff these devices with long-handled tools. Continued sessions to progress HEP develop and further instruct in compensatory/adaptive strategies to enhance mobility and activity tolerance.    Personal Factors and Comorbidities Age;Comorbidity 3+;Time since onset of injury/illness/exacerbation    Comorbidities right LE orthopedics, COPD, cardiac conditions    Examination-Activity Limitations Carry;Dressing;Lift;Toileting;Stand;Stairs;Squat;Locomotion Level;Transfers    Examination-Participation Restrictions Cleaning;Community Activity;Driving;Yard Work;Occupation;Meal Prep    Stability/Clinical Decision Making Evolving/Moderate complexity    Rehab Potential Fair    PT Frequency 2x / week    PT Duration 8 weeks    PT Treatment/Interventions ADLs/Self Care Home Management;Aquatic Therapy;Electrical Stimulation;DME Instruction;Gait training;Stair training;Functional mobility training;Therapeutic activities;Therapeutic exercise;Balance training;Neuromuscular re-education;Manual techniques;Passive range of motion;Dry needling;Energy conservation;Joint Manipulations;Vestibular;Taping;Patient/family education    PT Next Visit Plan seated LAQ and hamstring curl with red t-loop, HEP review    PT Home Exercise Plan [id number] seated knee flexion AAROM,  hip add squeeze, standing with LLE elevated on 6" step, heel slides with strap, hip abd with red t-loop, sidestepping with t-loop    Consulted and Agree with Plan of Care Patient    Family Member Consulted daughter             Patient will benefit from skilled therapeutic intervention in order to improve the following deficits and impairments:  Abnormal gait, Decreased activity tolerance, Decreased balance, Decreased mobility, Decreased range of  motion, Decreased strength, Difficulty walking, Impaired flexibility, Improper body mechanics, Pain  Visit Diagnosis: Muscle weakness (generalized)  Unsteadiness on feet  Other abnormalities of gait and mobility  Difficulty in walking, not elsewhere classified     Problem List Patient Active Problem List   Diagnosis Date Noted   Palliative care encounter 07/27/2021   Constipation 07/27/2021   Cricopharyngeal achalasia 06/24/2021   Disorder of cervical esophageal region 06/23/2021   Pressure injury of skin 06/21/2021   Physical deconditioning 06/21/2021   SOB (shortness of breath) 06/18/2021   Acute on chronic respiratory failure with hypoxia (HCC) 06/18/2021   Chronic diastolic CHF (congestive heart failure) ([place]) 06/18/2021   Chronic anemia 06/18/2021   COVID-19 virus infection 06/17/2021   Chronic pain of right knee 06/20/2020   Abnormality of gait 06/20/2020   Malnutrition of moderate degree 02/25/2020   S/P percutaneous endoscopic gastrostomy (PEG) tube placement (HCC)    Post-operative pain    Subtherapeutic international normalized ratio (INR)    Debility 02/17/2020   Mesenteric ischemia (HCC)    Sepsis ([place]) 02/08/2020   Small bowel ischemia ([place]) 02/08/2020   Thrombocytosis 02/08/2020   Abnormal CT of the abdomen    Periprosthetic fracture around internal prosthetic hip joint 11/16/2019   Periprosthetic fracture around internal prosthetic hip joint, initial encounter    Chronic obstructive pulmonary disease (HCC)    Essential hypertension    Chronic anticoagulation    Acute blood loss anemia    Transaminitis    Hypoalbuminemia due to protein-calorie malnutrition ([place])    Labile blood pressure    Drug induced constipation    Postoperative pain    Subcapital fracture of femur ([place]) 10/30/2019   Post-operative state    Prerenal azotemia    Leukocytosis    Closed right hip fracture, initial encounter ([place]) 10/27/2019   Hoarseness 02/19/2018   Sensorineural  hearing loss (SNHL) of both ears 02/19/2018   Tinnitus, bilateral 02/19/2018   Cough 09/06/2017   Cough variant asthma 08/19/2017   Acute bronchitis 02/20/2016   Pulmonary air trapping 01/04/2016   Encounter for preoperative pulmonary examination 01/04/2016   NSIP (nonspecific interstitial pneumonia) ([place]) 09/26/2015   Chronic sinusitis 09/26/2015   ILD (interstitial lung disease) ([place]) 07/12/2014   Upper airway cough syndrome 09/23/2013   Encounter for therapeutic drug monitoring 08/11/2013   Congestive heart failure ([place]) 07/08/2013   Coronary artery calcification seen on CAT scan 03/15/2013   Nodule of left lung 03/15/2013   Orthostatic lightheadedness 02/09/2013   Depression    Diverticulitis 01/01/2012   GERD (gastroesophageal reflux disease) 01/01/2012   Hypothyroid 11/23/2010   Edema 10/04/2010   Bronchiectasis ([place])    HYPERTENSION, BENIGN 10/22/2008   A-fib ([place])     Toniann Fail, PT 12/21/2021, 4:43 PM  [facility] [address] [place], [place], [address] Phone: [phone]   Fax:  [phone]  Name: Stephanie Cortez MRN: [id number] Date of Birth: 11-12-1933

## 2021-12-25 ENCOUNTER — Encounter: Payer: Self-pay | Admitting: Internal Medicine

## 2021-12-25 MED ORDER — CEPHALEXIN 500 MG PO CAPS: 500.0000 mg | ORAL_CAPSULE | Freq: Three times a day (TID) | ORAL | 0 refills | Status: DC

## 2021-12-25 NOTE — Telephone Encounter (Signed)
Ok  Please ask daughter to attach a photo of the cellulitis or atleast take a photo to se respnse to cephalexin 2. Ok for [place] to prescribe cephalexin but any futher input on cellulitis best served by PCP Merri Brunette, MD 3. Cephalexin 500mg  tid x 7 days 4. Address futher cephalexin questions to PCP .

## 2021-12-26 ENCOUNTER — Ambulatory Visit: Payer: Medicare Other

## 2021-12-26 DIAGNOSIS — R2681 Unsteadiness on feet: Secondary | ICD-10-CM

## 2021-12-26 DIAGNOSIS — R2689 Other abnormalities of gait and mobility: Secondary | ICD-10-CM | POA: Diagnosis not present

## 2021-12-26 DIAGNOSIS — M6281 Muscle weakness (generalized): Secondary | ICD-10-CM | POA: Diagnosis not present

## 2021-12-26 DIAGNOSIS — R262 Difficulty in walking, not elsewhere classified: Secondary | ICD-10-CM

## 2021-12-26 NOTE — Therapy (Signed)
[facility] [address] [place], [place], [address] Phone: [phone]   Fax:  [phone]  Physical Therapy Treatment  Patient Details  Name: Stephanie Cortez MRN: [id number] Date of Birth: 1933/08/10 Referring Provider (PT): Kalman Shan, MD   Encounter Date: 12/26/2021   PT End of Session - 12/26/21 1151     Visit Number 13    Number of Visits 21    Date for PT Re-Evaluation 01/18/22    Authorization Type Medicare A and B/ U[employer] Supplemental 2023    Progress Note Due on Visit 22    PT Start Time 1147    PT Stop Time 1230    PT Time Calculation (min) 43 min    Activity Tolerance Patient tolerated treatment well    Behavior During Therapy [facility] for tasks assessed/performed             Past Medical History:  Diagnosis Date   Anemia    - Hgb 9.7gm% on 07/13/2008 in [place] -  Hgg 129gm% wiht normal irone levsl and ferritin 10/27/2008 in [place] Recurrent otitis/sinusitis   Anxiety    chronic BZ prn   Atrial fibrillation (HCC)    chronic anticoag   Bronchiectasis    >PFT 07/13/2008 in [place]  Fev 1.9L/76%, FVC 2.45L/74, Ratio 79, TLC 121%, DLCO 64%  AE BRonchiectasis - Dec 2010.New Rx:  outpatient - Feb 2011 - Rx outpatient   CHF (congestive heart failure) (HCC)    COPD (chronic obstructive pulmonary disease) (HCC)    bronchiectasis   Cricopharyngeal achalasia    Depression    Diverticulosis    Dyslipidemia    Eczema    Fatty liver    GERD with stricture    Glaucoma    H. 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HCC)    Ventral hernia    Vertigo    Wears glasses     Past Surgical History:  Procedure Laterality Date   BOWEL RESECTION N/A 02/08/2020   Procedure: SMALL BOWEL RESECTION;  Surgeon: Manus Rudd, MD;  [facility];  Service: General;   Laterality: N/A;   BREAST SURGERY     br bx   CARDIAC CATHETERIZATION  07/01/2013   CATARACT EXTRACTION     both   COLONOSCOPY     ESOPHAGOSCOPY W/ BOTOX INJECTION  12/11/2011   Procedure: ESOPHAGOSCOPY WITH BOTOX INJECTION;  Surgeon: Drema Halon, MD;  Location: [facility];  Service: ENT;  Laterality: N/A;  esophogoscopy with dilation, botox injection   FOOT SURGERY  03/14/2011   gastroc slide-rt   GASTROSTOMY N/A 02/08/2020   Procedure: INSERTION OF GASTROSTOMY TUBE;  Surgeon: Manus Rudd, MD;  [facility];  Service: General;  Laterality: N/A;   HIP ARTHROPLASTY Right 10/28/2019   Procedure: ARTHROPLASTY BIPOLAR HIP (HEMIARTHROPLASTY);  Surgeon: Eldred Manges, MD;  Location: [facility];  Service: Orthopedics;  Laterality: Right;   LAPAROTOMY N/A 02/08/2020   Procedure: EXPLORATORY LAPAROTOMY;  Surgeon: Manus Rudd, MD;  Location[facility] OR;  Service: General;  Laterality: N/A;   LYSIS OF ADHESION N/A 02/08/2020   Procedure: LYSIS OF ADHESIONS;  Surgeon: Manus Rudd, MD;  [facility];  Service: General;  Laterality: N/A;   TOTAL ABDOMINAL HYSTERECTOMY  TOTAL HIP REVISION Right 11/16/2019   Procedure: TOTAL HIP REVISION BIPOLAR TO CEMENTED BIPOLAR;  Surgeon: Eldred Manges, MD;  [facility];  Service: Orthopedics;  Laterality: Right;   WISDOM TOOTH EXTRACTION      There were no vitals filed for this visit.   Subjective Assessment - 12/26/21 1151     Subjective No change in right hip, but notes her lower leg has been itching and reveals her right lower leg is swollen, red, dry/scaly patches, irregular margins.  PT recommends f/u with PCP    Pertinent History right hip fx s/p THA and required revision, COPD, A-fib, CHF    Limitations Lifting;Standing;Walking;House hold activities    How long can you sit comfortably? 15-30 min    How long can you stand comfortably? varies    Patient Stated Goals decrease right hip pain, improve strength/balance/walking     Currently in Pain? Yes    Pain Score 6     Pain Location Hip    Pain Orientation Right    Pain Descriptors / Indicators Sore    Pain Type Chronic pain    Pain Onset More than a month ago                               [facility] Adult PT Treatment/Exercise - 12/26/21 0001       Knee/Hip Exercises: Seated   Long Arc Quad Strengthening;Right;3 sets;10 reps    Long Arc Quad Limitations red t-loop    Heel Slides AAROM    Ball Squeeze 3x10    Hamstring Curl Strengthening;Right;3 sets;10 reps    Hamstring Limitations red t-loop                 Balance Exercises - 12/26/21 0001       Balance Exercises: Standing   Sidestepping 1 rep;Theraband    Theraband Level (Sidestepping) Level 2 (Red)                  PT Short Term Goals - 12/21/21 1501       PT SHORT TERM GOAL #1   Title Patient will be independent in HEP to improve functional outcomes    Baseline poor recall; reports not consistently doing. Will reconfigure HEP    Time 4    Period Weeks    Status On-going    Target Date 11/23/21      PT SHORT TERM GOAL #2   Title Demonstrate right hip flexion to 90 degrees and right knee flexion to 100 degrees to improve body mechanics for transfers    Baseline 70 degrees hip flexion, 92 right knee flexion-- no change; 12/11/2021-R hip flexion in sitting 85 degrees    Time 4    Period Weeks    Status Not Met    Target Date 11/23/21      PT SHORT TERM GOAL #3   Title Manifest improved BLE strength per time of 30 sec 5xSTS test    Baseline 38.56" from elevated EOM; 59.44 sec 18" seat dependent on BUE support from arm rests; 54.84 sec 12/11/2021;    Time 4    Period Weeks    Status Not Met    Target Date 11/23/21               PT Long Term Goals - 12/21/21 1502       PT LONG TERM GOAL #1   Title Demo reduced risk for  falls per TUG test time 20 sec    Baseline 59.03 sec; 12/11/21: 75.90, 75.34 sec; 68 sec on 12/21/21    Time 4    Period  Weeks    Status On-going    Target Date 01/18/22      PT LONG TERM GOAL #2   Title Manifest improved BLE strength per time 25 sec 5xSTS    Baseline 54.84 sec 12/11/21; 56.12 sec on 12/21/21    Time 4    Period Weeks    Status On-going    Target Date 01/18/22                   Plan - 12/26/21 1241     Clinical Impression Statement Continued with tx sessions with focus on developing PRE using resistance loops that pt will be able to apply using long-handled reacher due to mobility deficits with emphsais on OKC PRE RLE using red t-loop for knee extension/flexion and sidestepping for hip abduction strength to improve LE stability.  Activity tolerance reduced today due to discomfort in RLE which has swollen/red appearance. Pt reports MD has assessed and started on abx and has additional f/u. Continued PT sessions to progress strength and mobility program to prepare for D/C to HEP    Personal Factors and Comorbidities Age;Comorbidity 3+;Time since onset of injury/illness/exacerbation    Comorbidities right LE orthopedics, COPD, cardiac conditions    Examination-Activity Limitations Carry;Dressing;Lift;Toileting;Stand;Stairs;Squat;Locomotion Level;Transfers    Examination-Participation Restrictions Cleaning;Community Activity;Driving;Yard Work;Occupation;Meal Prep    Stability/Clinical Decision Making Evolving/Moderate complexity    Rehab Potential Fair    PT Frequency 2x / week    PT Duration 8 weeks    PT Treatment/Interventions ADLs/Self Care Home Management;Aquatic Therapy;Electrical Stimulation;DME Instruction;Gait training;Stair training;Functional mobility training;Therapeutic activities;Therapeutic exercise;Balance training;Neuromuscular re-education;Manual techniques;Passive range of motion;Dry needling;Energy conservation;Joint Manipulations;Vestibular;Taping;Patient/family education    PT Next Visit Plan seated LAQ and hamstring curl with red t-loop, HEP review    PT Home  Exercise Plan [id number] seated knee flexion AAROM, hip add squeeze, standing with LLE elevated on 6" step, heel slides with strap, hip abd with red t-loop, sidestepping with t-loop    Consulted and Agree with Plan of Care Patient    Family Member Consulted daughter             Patient will benefit from skilled therapeutic intervention in order to improve the following deficits and impairments:  Abnormal gait, Decreased activity tolerance, Decreased balance, Decreased mobility, Decreased range of motion, Decreased strength, Difficulty walking, Impaired flexibility, Improper body mechanics, Pain  Visit Diagnosis: Muscle weakness (generalized)  Unsteadiness on feet  Other abnormalities of gait and mobility  Difficulty in walking, not elsewhere classified     Problem List Patient Active Problem List   Diagnosis Date Noted   Palliative care encounter 07/27/2021   Constipation 07/27/2021   Cricopharyngeal achalasia 06/24/2021   Disorder of cervical esophageal region 06/23/2021   Pressure injury of skin 06/21/2021   Physical deconditioning 06/21/2021   SOB (shortness of breath) 06/18/2021   Acute on chronic respiratory failure with hypoxia (HCC) 06/18/2021   Chronic diastolic CHF (congestive heart failure) (HCC) 06/18/2021   Chronic anemia 06/18/2021   COVID-19 virus infection 06/17/2021   Chronic pain of right knee 06/20/2020   Abnormality of gait 06/20/2020   Malnutrition of moderate degree 02/25/2020   S/P percutaneous endoscopic gastrostomy (PEG) tube placement (HCC)    Post-operative pain    Subtherapeutic international normalized ratio (INR)    Debility 02/17/2020   Mesenteric ischemia (  HCC)    Sepsis (HCC) 02/08/2020   Small bowel ischemia (HCC) 02/08/2020   Thrombocytosis 02/08/2020   Abnormal CT of the abdomen    Periprosthetic fracture around internal prosthetic hip joint 11/16/2019   Periprosthetic fracture around internal prosthetic hip joint, initial encounter     Chronic obstructive pulmonary disease (HCC)    Essential hypertension    Chronic anticoagulation    Acute blood loss anemia    Transaminitis    Hypoalbuminemia due to protein-calorie malnutrition (HCC)    Labile blood pressure    Drug induced constipation    Postoperative pain    Subcapital fracture of femur (HCC) 10/30/2019   Post-operative state    Prerenal azotemia    Leukocytosis    Closed right hip fracture, initial encounter (HCC) 10/27/2019   Hoarseness 02/19/2018   Sensorineural hearing loss (SNHL) of both ears 02/19/2018   Tinnitus, bilateral 02/19/2018   Cough 09/06/2017   Cough variant asthma 08/19/2017   Acute bronchitis 02/20/2016   Pulmonary air trapping 01/04/2016   Encounter for preoperative pulmonary examination 01/04/2016   NSIP (nonspecific interstitial pneumonia) (HCC) 09/26/2015   Chronic sinusitis 09/26/2015   ILD (interstitial lung disease) (HCC) 07/12/2014   Upper airway cough syndrome 09/23/2013   Encounter for therapeutic drug monitoring 08/11/2013   Congestive heart failure (HCC) 07/08/2013   Coronary artery calcification seen on CAT scan 03/15/2013   Nodule of left lung 03/15/2013   Orthostatic lightheadedness 02/09/2013   Depression    Diverticulitis 01/01/2012   GERD (gastroesophageal reflux disease) 01/01/2012   Hypothyroid 11/23/2010   Edema 10/04/2010   Bronchiectasis (HCC)    HYPERTENSION, BENIGN 10/22/2008   A-fib (HCC)     Dion Body, PT 12/26/2021, 12:44 PM  [facility] [name], [address] [place], [place], [address] Phone: [phone]   Fax:  [phone]  Name: AZKA WICKENHAUSER MRN: [id number] Date of Birth: 08-31-1933

## 2021-12-27 ENCOUNTER — Telehealth: Payer: Self-pay | Admitting: Internal Medicine

## 2021-12-27 NOTE — Telephone Encounter (Signed)
I left a message for the daughter to let us know if her mom was having respiratory complaints for the OV tomorrow. Waiting on a call back.

## 2021-12-27 NOTE — Telephone Encounter (Signed)
Thanks

## 2021-12-28 ENCOUNTER — Ambulatory Visit: Payer: Medicare Other | Admitting: Nurse Practitioner

## 2021-12-28 ENCOUNTER — Ambulatory Visit: Payer: Medicare Other

## 2021-12-28 DIAGNOSIS — M6281 Muscle weakness (generalized): Secondary | ICD-10-CM | POA: Diagnosis not present

## 2021-12-28 DIAGNOSIS — R2681 Unsteadiness on feet: Secondary | ICD-10-CM | POA: Diagnosis not present

## 2021-12-28 DIAGNOSIS — R262 Difficulty in walking, not elsewhere classified: Secondary | ICD-10-CM

## 2021-12-28 DIAGNOSIS — R2689 Other abnormalities of gait and mobility: Secondary | ICD-10-CM

## 2021-12-28 NOTE — Therapy (Signed)
[facility] [address], [place], [place], [address] Phone: [phone]   Fax:  [phone]  Physical Therapy Treatment  Patient Details  Name: Stephanie Cortez MRN: [id number] Date of Birth: 12-Aug-1933 Referring Provider (PT): Brand Males, MD   Encounter Date: 12/28/2021   PT End of Session - 12/28/21 1358     Visit Number 14    Number of Visits 21    Date for PT Re-Evaluation 01/18/22    Authorization Type Medicare A and B/ UHC Supplemental 2023    Progress Note Due on Visit 22    PT Start Time 1400    PT Stop Time 3428    PT Time Calculation (min) 45 min    Activity Tolerance Patient tolerated treatment well    Behavior During Therapy [facility] for tasks assessed/performed             Past Medical History:  Diagnosis Date   Anemia    - Hgb 9.7gm% on 07/13/2008 in [place] 129gm% wiht normal irone levsl and ferritin 10/27/2008 in [place] Recurrent otitis/sinusitis   Anxiety    chronic BZ prn   Atrial fibrillation ([place])    chronic anticoag   Bronchiectasis    >PFT 07/13/2008 in [place] 1.9L/76%, FVC 2.45L/74, Ratio 79, TLC 121%, DLCO 64%  AE BRonchiectasis - Dec 2010.New Rx:  outpatient - Feb 2011 - Rx outpatient   CHF (congestive heart failure) ([place])    COPD (chronic obstructive pulmonary disease) (HCC)    bronchiectasis   Cricopharyngeal achalasia    Depression    Diverticulosis    Dyslipidemia    Eczema    Fatty liver    GERD with stricture    Glaucoma    H. 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[place])    Ventral hernia    Vertigo    Wears glasses     Past Surgical History:  Procedure Laterality Date   BOWEL RESECTION N/A 02/08/2020   Procedure: SMALL BOWEL RESECTION;  Surgeon: Donnie Mesa, MD;  Location: [place];  Service: General;   Laterality: N/A;   BREAST SURGERY     br bx   CARDIAC CATHETERIZATION  07/01/2013   CATARACT EXTRACTION     both   COLONOSCOPY     ESOPHAGOSCOPY W/ BOTOX INJECTION  12/11/2011   Procedure: ESOPHAGOSCOPY WITH BOTOX INJECTION;  Surgeon: Rozetta Nunnery, MD;  Location: [place];  Service: ENT;  Laterality: N/A;  esophogoscopy with dilation, botox injection   FOOT SURGERY  03/14/2011   gastroc slide-rt   GASTROSTOMY N/A 02/08/2020   Procedure: INSERTION OF GASTROSTOMY TUBE;  Surgeon: Donnie Mesa, MD;  Location: [place];  Service: General;  Laterality: N/A;   HIP ARTHROPLASTY Right 10/28/2019   Procedure: ARTHROPLASTY BIPOLAR HIP (HEMIARTHROPLASTY);  Surgeon: Marybelle Killings, MD;  [facility];  Service: Orthopedics;  Laterality: Right;   LAPAROTOMY N/A 02/08/2020   Procedure: EXPLORATORY LAPAROTOMY;  Surgeon: Donnie Mesa, MD;  Location: [place];  Service: General;  Laterality: N/A;   LYSIS OF ADHESION N/A 02/08/2020   Procedure: LYSIS OF ADHESIONS;  Surgeon: Donnie Mesa, MD;  Location: [place];  Service: General;  Laterality: N/A;   TOTAL ABDOMINAL HYSTERECTOMY  TOTAL HIP REVISION Right 11/16/2019   Procedure: TOTAL HIP REVISION BIPOLAR TO CEMENTED BIPOLAR;  Surgeon: Marybelle Killings, MD;  Location: [place];  Service: Orthopedics;  Laterality: Right;   WISDOM TOOTH EXTRACTION      There were no vitals filed for this visit.   Subjective Assessment - 12/28/21 1402     Subjective Feeling well enough. Hip remains limited, but this is likely not to change. Notes her lower right leg is still swollen/red from cellulitis but she is still taking antibiotics, denies fever and other feeling of malaise    Pertinent History right hip fx s/p THA and required revision, COPD, A-fib, CHF    Limitations Lifting;Standing;Walking;House hold activities    How long can you sit comfortably? 15-30 min    How long can you stand comfortably? varies    Patient Stated Goals decrease right hip pain,  improve strength/balance/walking    Currently in Pain? Yes    Pain Score 7     Pain Location Hip    Pain Orientation Right    Pain Descriptors / Indicators Sore    Pain Type Chronic pain    Pain Onset More than a month ago                               [facility] Adult PT Treatment/Exercise - 12/28/21 0001       Ambulation/Gait   Ambulation/Gait Yes    Ambulation/Gait Assistance 6: Modified independent (Device/Increase time)    Ambulation Distance (Feet) 120 Feet    Assistive device Rolling walker    Gait Pattern Step-to pattern;Decreased stance time - right;Step-through pattern    Ambulation Surface Level;Indoor    Gait Comments cues for taking large left step length to improve velocity/symmetry      Knee/Hip Exercises: Seated   Long Arc Quad Strengthening;Right;3 sets;10 reps    Long Arc Quad Limitations red t-loop    Heel Slides AAROM;Right;3 sets;10 reps    Heel Slides Limitations 5 sec stretch for right quad, achieves 90-95 degrees flexion    Ball Squeeze 3x10, 3 sec    Hamstring Curl Strengthening;Right;3 sets;10 reps    Hamstring Limitations red t-loop                 Balance Exercises - 12/28/21 0001       Balance Exercises: Standing   Standing Eyes Opened Wide (BOA);Narrow base of support (BOS);Foam/compliant surface;3 reps;30 secs    Standing Eyes Closed Narrow base of support (BOS);Wide (BOA);Foam/compliant surface;2 reps;10 secs    Sidestepping 2 reps;Theraband   along countertop, t-loop. Cues for RLE posture   Theraband Level (Sidestepping) Level 2 (Red)    Lift / Chop 5 reps;Both;Other (comment)   standing on foam transferring cones from right<>left, different heights                 PT Short Term Goals - 12/21/21 1501       PT SHORT TERM GOAL #1   Title Patient will be independent in HEP to improve functional outcomes    Baseline poor recall; reports not consistently doing. Will reconfigure HEP    Time 4    Period Weeks     Status On-going    Target Date 11/23/21      PT SHORT TERM GOAL #2   Title Demonstrate right hip flexion to 90 degrees and right knee flexion to 100 degrees to improve body mechanics for transfers  Baseline 70 degrees hip flexion, 92 right knee flexion-- no change; 12/11/2021-R hip flexion in sitting 85 degrees    Time 4    Period Weeks    Status Not Met    Target Date 11/23/21      PT SHORT TERM GOAL #3   Title Manifest improved BLE strength per time of 30 sec 5xSTS test    Baseline 38.56" from elevated EOM; 59.44 sec 18" seat dependent on BUE support from arm rests; 54.84 sec 12/11/2021;    Time 4    Period Weeks    Status Not Met    Target Date 11/23/21               PT Long Term Goals - 12/21/21 1502       PT LONG TERM GOAL #1   Title Demo reduced risk for falls per TUG test time 20 sec    Baseline 59.03 sec; 12/11/21: 75.90, 75.34 sec; 68 sec on 12/21/21    Time 4    Period Weeks    Status On-going    Target Date 01/18/22      PT LONG TERM GOAL #2   Title Manifest improved BLE strength per time 25 sec 5xSTS    Baseline 54.84 sec 12/11/21; 56.12 sec on 12/21/21    Time 4    Period Weeks    Status On-going    Target Date 01/18/22                   Plan - 12/28/21 1501     Clinical Impression Statement improving recall with HEP activities for LE PRE using t-loop for resistance in order to improve independence and carryover with HEP as she can don/doff resistance loop with long-handled reacher.  CUes 50% of the time for sequence and reps/sets as she did not have her folder with HEP activities with her today.  Proceeded with balance activities to improve postural stability and limits of stability with performance of activities on compliant surface and emphasis on head movements and rotation to improve safety with ADL and mobility in compromising positions to reduce risk for falls    Personal Factors and Comorbidities Age;Comorbidity 3+;Time since onset of  injury/illness/exacerbation    Comorbidities right LE orthopedics, COPD, cardiac conditions    Examination-Activity Limitations Carry;Dressing;Lift;Toileting;Stand;Stairs;Squat;Locomotion Level;Transfers    Examination-Participation Restrictions Cleaning;Community Activity;Driving;Yard Work;Occupation;Meal Prep    Stability/Clinical Decision Making Evolving/Moderate complexity    Rehab Potential Fair    PT Frequency 2x / week    PT Duration 8 weeks    PT Treatment/Interventions ADLs/Self Care Home Management;Aquatic Therapy;Electrical Stimulation;DME Instruction;Gait training;Stair training;Functional mobility training;Therapeutic activities;Therapeutic exercise;Balance training;Neuromuscular re-education;Manual techniques;Passive range of motion;Dry needling;Energy conservation;Joint Manipulations;Vestibular;Taping;Patient/family education    PT Next Visit Plan seated LAQ and hamstring curl with red t-loop, HEP review    PT Home Exercise Plan [id number] seated knee flexion AAROM, hip add squeeze, standing with LLE elevated on 6" step, heel slides with strap, hip abd with red t-loop, sidestepping with t-loop    Consulted and Agree with Plan of Care Patient    Family Member Consulted daughter             Patient will benefit from skilled therapeutic intervention in order to improve the following deficits and impairments:  Abnormal gait, Decreased activity tolerance, Decreased balance, Decreased mobility, Decreased range of motion, Decreased strength, Difficulty walking, Impaired flexibility, Improper body mechanics, Pain  Visit Diagnosis: Muscle weakness (generalized)  Unsteadiness on feet  Other abnormalities of gait  and mobility  Difficulty in walking, not elsewhere classified     Problem List Patient Active Problem List   Diagnosis Date Noted   Palliative care encounter 07/27/2021   Constipation 07/27/2021   Cricopharyngeal achalasia 06/24/2021   Disorder of cervical  esophageal region 06/23/2021   Pressure injury of skin 06/21/2021   Physical deconditioning 06/21/2021   SOB (shortness of breath) 06/18/2021   Acute on chronic respiratory failure with hypoxia (HCC) 06/18/2021   Chronic diastolic CHF (congestive heart failure) ([place]) 06/18/2021   Chronic anemia 06/18/2021   COVID-19 virus infection 06/17/2021   Chronic pain of right knee 06/20/2020   Abnormality of gait 06/20/2020   Malnutrition of moderate degree 02/25/2020   S/P percutaneous endoscopic gastrostomy (PEG) tube placement (HCC)    Post-operative pain    Subtherapeutic international normalized ratio (INR)    Debility 02/17/2020   Mesenteric ischemia (HCC)    Sepsis ([place]) 02/08/2020   Small bowel ischemia ([place]) 02/08/2020   Thrombocytosis 02/08/2020   Abnormal CT of the abdomen    Periprosthetic fracture around internal prosthetic hip joint 11/16/2019   Periprosthetic fracture around internal prosthetic hip joint, initial encounter    Chronic obstructive pulmonary disease (HCC)    Essential hypertension    Chronic anticoagulation    Acute blood loss anemia    Transaminitis    Hypoalbuminemia due to protein-calorie malnutrition (HCC)    Labile blood pressure    Drug induced constipation    Postoperative pain    Subcapital fracture of femur ([place]) 10/30/2019   Post-operative state    Prerenal azotemia    Leukocytosis    Closed right hip fracture, initial encounter ([place]) 10/27/2019   Hoarseness 02/19/2018   Sensorineural hearing loss (SNHL) of both ears 02/19/2018   Tinnitus, bilateral 02/19/2018   Cough 09/06/2017   Cough variant asthma 08/19/2017   Acute bronchitis 02/20/2016   Pulmonary air trapping 01/04/2016   Encounter for preoperative pulmonary examination 01/04/2016   NSIP (nonspecific interstitial pneumonia) ([place]) 09/26/2015   Chronic sinusitis 09/26/2015   ILD (interstitial lung disease) ([place]) 07/12/2014   Upper airway cough syndrome 09/23/2013   Encounter for  therapeutic drug monitoring 08/11/2013   Congestive heart failure ([place]) 07/08/2013   Coronary artery calcification seen on CAT scan 03/15/2013   Nodule of left lung 03/15/2013   Orthostatic lightheadedness 02/09/2013   Depression    Diverticulitis 01/01/2012   GERD (gastroesophageal reflux disease) 01/01/2012   Hypothyroid 11/23/2010   Edema 10/04/2010   Bronchiectasis ([place])    HYPERTENSION, BENIGN 10/22/2008   A-fib ([place])     Toniann Fail, PT 12/28/2021, 3:15 PM  [facility] [address] [place], [place], [address] Phone: [phone]   Fax:  [phone]  Name: LARRA CRUNKLETON MRN: [id number] Date of Birth: [birth year]

## 2022-01-04 ENCOUNTER — Ambulatory Visit: Payer: Medicare Other | Attending: Internal Medicine | Admitting: Physical Therapy

## 2022-01-04 ENCOUNTER — Encounter: Payer: Self-pay | Admitting: Physical Therapy

## 2022-01-04 DIAGNOSIS — R2681 Unsteadiness on feet: Secondary | ICD-10-CM | POA: Insufficient documentation

## 2022-01-04 DIAGNOSIS — M6281 Muscle weakness (generalized): Secondary | ICD-10-CM | POA: Insufficient documentation

## 2022-01-04 DIAGNOSIS — R262 Difficulty in walking, not elsewhere classified: Secondary | ICD-10-CM | POA: Diagnosis not present

## 2022-01-04 DIAGNOSIS — R2689 Other abnormalities of gait and mobility: Secondary | ICD-10-CM | POA: Diagnosis not present

## 2022-01-04 NOTE — Therapy (Signed)
[facility] [address], [place], [place], [address] Phone: [phone]   Fax:  [phone]  Physical Therapy Treatment  Patient Details  Name: Stephanie Cortez MRN: [id number] Date of Birth: 02-Jul-1934 Referring Provider (PT): Brand Males, MD   Encounter Date: 01/04/2022   PT End of Session - 01/04/22 1035     Visit Number 15    Number of Visits 21    Date for PT Re-Evaluation 01/18/22    Authorization Type Medicare A and B/ UHC Supplemental 2023    Progress Note Due on Visit 22    PT Start Time 1018    PT Stop Time 1057    PT Time Calculation (min) 39 min    Activity Tolerance Patient tolerated treatment well    Behavior During Therapy [facility] for tasks assessed/performed             Past Medical History:  Diagnosis Date   Anemia    - Hgb 9.7gm% on 07/13/2008 in [place] 129gm% wiht normal irone levsl and ferritin 10/27/2008 in [place] Recurrent otitis/sinusitis   Anxiety    chronic BZ prn   Atrial fibrillation ([place])    chronic anticoag   Bronchiectasis    >PFT 07/13/2008 in [place] 1.9L/76%, FVC 2.45L/74, Ratio 79, TLC 121%, DLCO 64%  AE BRonchiectasis - Dec 2010.New Rx:  outpatient - Feb 2011 - Rx outpatient   CHF (congestive heart failure) ([place])    COPD (chronic obstructive pulmonary disease) (HCC)    bronchiectasis   Cricopharyngeal achalasia    Depression    Diverticulosis    Dyslipidemia    Eczema    Fatty liver    GERD with stricture    Glaucoma    H. 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[place])    Ventral hernia    Vertigo    Wears glasses     Past Surgical History:  Procedure Laterality Date   BOWEL RESECTION N/A 02/08/2020   Procedure: SMALL BOWEL RESECTION;  Surgeon: Donnie Mesa, MD;  Location: [place];  Service: General;   Laterality: N/A;   BREAST SURGERY     br bx   CARDIAC CATHETERIZATION  07/01/2013   CATARACT EXTRACTION     both   COLONOSCOPY     ESOPHAGOSCOPY W/ BOTOX INJECTION  12/11/2011   Procedure: ESOPHAGOSCOPY WITH BOTOX INJECTION;  Surgeon: Rozetta Nunnery, MD;  Location: [place];  Service: ENT;  Laterality: N/A;  esophogoscopy with dilation, botox injection   FOOT SURGERY  03/14/2011   gastroc slide-rt   GASTROSTOMY N/A 02/08/2020   Procedure: INSERTION OF GASTROSTOMY TUBE;  Surgeon: Donnie Mesa, MD;  Location: [place];  Service: General;  Laterality: N/A;   HIP ARTHROPLASTY Right 10/28/2019   Procedure: ARTHROPLASTY BIPOLAR HIP (HEMIARTHROPLASTY);  Surgeon: Marybelle Killings, MD;  Location[facility];  Service: Orthopedics;  Laterality: Right;   LAPAROTOMY N/A 02/08/2020   Procedure: EXPLORATORY LAPAROTOMY;  Surgeon: Donnie Mesa, MD;  Location: [place];  Service: General;  Laterality: N/A;   LYSIS OF ADHESION N/A 02/08/2020   Procedure: LYSIS OF ADHESIONS;  Surgeon: Donnie Mesa, MD;  Location: [place];  Service: General;  Laterality: N/A;   TOTAL ABDOMINAL HYSTERECTOMY  TOTAL HIP REVISION Right 11/16/2019   Procedure: TOTAL HIP REVISION BIPOLAR TO CEMENTED BIPOLAR;  Surgeon: Marybelle Killings, MD;  Location: [place];  Service: Orthopedics;  Laterality: Right;   WISDOM TOOTH EXTRACTION      There were no vitals filed for this visit.   Subjective Assessment - 01/04/22 1022     Subjective Doing OK today, leg still red still on antibiotics. No falls since last time, no close calls. Nothing new really. HEP is so-so, I'm not a good home follower.    Pertinent History right hip fx s/p THA and required revision, COPD, A-fib, CHF    Patient Stated Goals decrease right hip pain, improve strength/balance/walking    Currently in Pain? Yes    Pain Score 7     Pain Location Hip    Pain Orientation Right    Pain Descriptors / Indicators Sore    Pain Type Chronic pain                                [facility] Adult PT Treatment/Exercise - 01/04/22 0001       Therapeutic Activites    Therapeutic Activities Other Therapeutic Activities    Other Therapeutic Activities practice getting head forward over toes- unable due to hip immobility and FOF, resistance at trunk      Knee/Hip Exercises: Seated   Long Arc Quad Strengthening;2 sets;15 reps    Long Arc Quad Limitations red TB    Sit to [employer] 1 set;with UE support   3 from standard mat table     Knee/Hip Exercises: Supine   Bridges Both;2 sets;5 reps    Other Supine Knee/Hip Exercises supine clam shells 2x10 red TB                 Balance Exercises - 01/04/22 0001       Balance Exercises: Standing   Other Standing Exercises tandem stance 3x30 seconds B                PT Education - 01/04/22 1240     Education Details exercise form/purpose, nose over toes for transfers    Person(s) Educated Patient    Methods Explanation    Comprehension Verbalized understanding              PT Short Term Goals - 12/21/21 1501       PT SHORT TERM GOAL #1   Title Patient will be independent in HEP to improve functional outcomes    Baseline poor recall; reports not consistently doing. Will reconfigure HEP    Time 4    Period Weeks    Status On-going    Target Date 11/23/21      PT SHORT TERM GOAL #2   Title Demonstrate right hip flexion to 90 degrees and right knee flexion to 100 degrees to improve body mechanics for transfers    Baseline 70 degrees hip flexion, 92 right knee flexion-- no change; 12/11/2021-R hip flexion in sitting 85 degrees    Time 4    Period Weeks    Status Not Met    Target Date 11/23/21      PT SHORT TERM GOAL #3   Title Manifest improved BLE strength per time of 30 sec 5xSTS test    Baseline 38.56" from elevated EOM; 59.44 sec 18" seat dependent on BUE support from arm rests; 54.84 sec 12/11/2021;    Time 4  Period Weeks    Status Not Met    Target  Date 11/23/21               PT Long Term Goals - 12/21/21 1502       PT LONG TERM GOAL #1   Title Demo reduced risk for falls per TUG test time 20 sec    Baseline 59.03 sec; 12/11/21: 75.90, 75.34 sec; 68 sec on 12/21/21    Time 4    Period Weeks    Status On-going    Target Date 01/18/22      PT LONG TERM GOAL #2   Title Manifest improved BLE strength per time 25 sec 5xSTS    Baseline 54.84 sec 12/11/21; 56.12 sec on 12/21/21    Time 4    Period Weeks    Status On-going    Target Date 01/18/22                   Plan - 01/04/22 1241     Clinical Impression [place] arrives today doing well, still having quite a bit of pain in her R hip. We continued working on functional strength in various positions, did try to increase time spent on sitting and standing strength today as well. Really has a strong posterior lean in sitting and tends to "vault" over LEs instead of getting head/trunk forward adequately during transfers.  Finished by working on some Hotel manager. Will continue to progress as able and tolerated.    Personal Factors and Comorbidities Age;Comorbidity 3+;Time since onset of injury/illness/exacerbation    Comorbidities right LE orthopedics, COPD, cardiac conditions    Examination-Activity Limitations Carry;Dressing;Lift;Toileting;Stand;Stairs;Squat;Locomotion Level;Transfers    Examination-Participation Restrictions Cleaning;Community Activity;Driving;Yard Work;Occupation;Meal Prep    Stability/Clinical Decision Making Evolving/Moderate complexity    Clinical Decision Making Moderate    Rehab Potential Fair    PT Frequency 2x / week    PT Duration 8 weeks    PT Treatment/Interventions ADLs/Self Care Home Management;Aquatic Therapy;Electrical Stimulation;DME Instruction;Gait training;Stair training;Functional mobility training;Therapeutic activities;Therapeutic exercise;Balance training;Neuromuscular re-education;Manual techniques;Passive range of  motion;Dry needling;Energy conservation;Joint Manipulations;Vestibular;Taping;Patient/family education    PT Next Visit Plan seated LAQ and hamstring curl with red t-loop, HEP review    PT Home Exercise Plan [id number] seated knee flexion AAROM, hip add squeeze, standing with LLE elevated on 6" step, heel slides with strap, hip abd with red t-loop, sidestepping with t-loop    Consulted and Agree with Plan of Care Patient             Patient will benefit from skilled therapeutic intervention in order to improve the following deficits and impairments:  Abnormal gait, Decreased activity tolerance, Decreased balance, Decreased mobility, Decreased range of motion, Decreased strength, Difficulty walking, Impaired flexibility, Improper body mechanics, Pain  Visit Diagnosis: Muscle weakness (generalized)  Unsteadiness on feet  Other abnormalities of gait and mobility  Difficulty in walking, not elsewhere classified     Problem List Patient Active Problem List   Diagnosis Date Noted   Palliative care encounter 07/27/2021   Constipation 07/27/2021   Cricopharyngeal achalasia 06/24/2021   Disorder of cervical esophageal region 06/23/2021   Pressure injury of skin 06/21/2021   Physical deconditioning 06/21/2021   SOB (shortness of breath) 06/18/2021   Acute on chronic respiratory failure with hypoxia (HCC) 06/18/2021   Chronic diastolic CHF (congestive heart failure) ([place]) 06/18/2021   Chronic anemia 06/18/2021   COVID-19 virus infection 06/17/2021   Chronic pain of right knee 06/20/2020   Abnormality of gait  06/20/2020   Malnutrition of moderate degree 02/25/2020   S/P percutaneous endoscopic gastrostomy (PEG) tube placement (HCC)    Post-operative pain    Subtherapeutic international normalized ratio (INR)    Debility 02/17/2020   Mesenteric ischemia (HCC)    Sepsis ([place]) 02/08/2020   Small bowel ischemia ([place]) 02/08/2020   Thrombocytosis 02/08/2020   Abnormal CT of the abdomen     Periprosthetic fracture around internal prosthetic hip joint 11/16/2019   Periprosthetic fracture around internal prosthetic hip joint, initial encounter    Chronic obstructive pulmonary disease (HCC)    Essential hypertension    Chronic anticoagulation    Acute blood loss anemia    Transaminitis    Hypoalbuminemia due to protein-calorie malnutrition (HCC)    Labile blood pressure    Drug induced constipation    Postoperative pain    Subcapital fracture of femur ([place]) 10/30/2019   Post-operative state    Prerenal azotemia    Leukocytosis    Closed right hip fracture, initial encounter ([place]) 10/27/2019   Hoarseness 02/19/2018   Sensorineural hearing loss (SNHL) of both ears 02/19/2018   Tinnitus, bilateral 02/19/2018   Cough 09/06/2017   Cough variant asthma 08/19/2017   Acute bronchitis 02/20/2016   Pulmonary air trapping 01/04/2016   Encounter for preoperative pulmonary examination 01/04/2016   NSIP (nonspecific interstitial pneumonia) ([place]) 09/26/2015   Chronic sinusitis 09/26/2015   ILD (interstitial lung disease) ([place]) 07/12/2014   Upper airway cough syndrome 09/23/2013   Encounter for therapeutic drug monitoring 08/11/2013   Congestive heart failure ([place]) 07/08/2013   Coronary artery calcification seen on CAT scan 03/15/2013   Nodule of left lung 03/15/2013   Orthostatic lightheadedness 02/09/2013   Depression    Diverticulitis 01/01/2012   GERD (gastroesophageal reflux disease) 01/01/2012   Hypothyroid 11/23/2010   Edema 10/04/2010   Bronchiectasis ([place])    HYPERTENSION, BENIGN 10/22/2008   A-fib ([place])    [name] Lions PT, DPT, PN2   Supplemental Physical Therapist [facility] [address] [place], [place], [address] Phone: [phone]   Fax:  [phone]  Name: Stephanie Cortez MRN: [id number] Date of Birth: 1933-11-19

## 2022-01-04 NOTE — Telephone Encounter (Signed)
She did not come in the office for that last OV. Closing encounter.

## 2022-01-08 ENCOUNTER — Ambulatory Visit: Payer: Medicare Other

## 2022-01-08 DIAGNOSIS — M6281 Muscle weakness (generalized): Secondary | ICD-10-CM

## 2022-01-08 DIAGNOSIS — R2681 Unsteadiness on feet: Secondary | ICD-10-CM

## 2022-01-08 DIAGNOSIS — R2689 Other abnormalities of gait and mobility: Secondary | ICD-10-CM

## 2022-01-08 DIAGNOSIS — R262 Difficulty in walking, not elsewhere classified: Secondary | ICD-10-CM | POA: Diagnosis not present

## 2022-01-08 NOTE — Therapy (Signed)
[facility] [address] [place], [place], [address] Phone: [phone]   Fax:  [phone]  Physical Therapy Treatment  Patient Details  Name: Stephanie Cortez MRN: [id number] Date of Birth: 1934-02-18 Referring Provider (PT): Brand Males, MD   Encounter Date: 01/08/2022   PT End of Session - 01/08/22 1501     Visit Number 16    Number of Visits 21    Date for PT Re-Evaluation 01/18/22    Authorization Type Medicare A and B/ UHC Supplemental 2023    Progress Note Due on Visit 22    PT Start Time 1456   late arrival   PT Stop Time 1530    PT Time Calculation (min) 34 min    Activity Tolerance Patient tolerated treatment well    Behavior During Therapy [facility] for tasks assessed/performed             Past Medical History:  Diagnosis Date   Anemia    - Hgb 9.7gm% on 07/13/2008 in [place] 129gm% wiht normal irone levsl and ferritin 10/27/2008 in [place] Recurrent otitis/sinusitis   Anxiety    chronic BZ prn   Atrial fibrillation ([place])    chronic anticoag   Bronchiectasis    >PFT 07/13/2008 in [place] 1.9L/76%, FVC 2.45L/74, Ratio 79, TLC 121%, DLCO 64%  AE BRonchiectasis - Dec 2010.New Rx:  outpatient - Feb 2011 - Rx outpatient   CHF (congestive heart failure) ([place])    COPD (chronic obstructive pulmonary disease) (HCC)    bronchiectasis   Cricopharyngeal achalasia    Depression    Diverticulosis    Dyslipidemia    Eczema    Fatty liver    GERD with stricture    Glaucoma    H. 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[place])    Ventral hernia    Vertigo    Wears glasses     Past Surgical History:  Procedure Laterality Date   BOWEL RESECTION N/A 02/08/2020   Procedure: SMALL BOWEL RESECTION;  Surgeon: Donnie Mesa, MD;  Location: [facility];   Service: General;  Laterality: N/A;   BREAST SURGERY     br bx   CARDIAC CATHETERIZATION  07/01/2013   CATARACT EXTRACTION     both   COLONOSCOPY     ESOPHAGOSCOPY W/ BOTOX INJECTION  12/11/2011   Procedure: ESOPHAGOSCOPY WITH BOTOX INJECTION;  Surgeon: Rozetta Nunnery, MD;  Location: [facility];  Service: ENT;  Laterality: N/A;  esophogoscopy with dilation, botox injection   FOOT SURGERY  03/14/2011   gastroc slide-rt   GASTROSTOMY N/A 02/08/2020   Procedure: INSERTION OF GASTROSTOMY TUBE;  Surgeon: Donnie Mesa, MD;  Location: [facility];  Service: General;  Laterality: N/A;   HIP ARTHROPLASTY Right 10/28/2019   Procedure: ARTHROPLASTY BIPOLAR HIP (HEMIARTHROPLASTY);  Surgeon: Marybelle Killings, MD;  [facility];  Service: Orthopedics;  Laterality: Right;   LAPAROTOMY N/A 02/08/2020   Procedure: EXPLORATORY LAPAROTOMY;  Surgeon: Donnie Mesa, MD;  Location: [facility];  Service: General;  Laterality: N/A;   LYSIS OF ADHESION N/A 02/08/2020   Procedure: LYSIS OF ADHESIONS;  Surgeon: Donnie Mesa, MD;  Location: [facility];  Service: General;  Laterality: N/A;   TOTAL  ABDOMINAL HYSTERECTOMY     TOTAL HIP REVISION Right 11/16/2019   Procedure: TOTAL HIP REVISION BIPOLAR TO CEMENTED BIPOLAR;  Surgeon: Marybelle Killings, MD;  Location: [facility];  Service: Orthopedics;  Laterality: Right;   WISDOM TOOTH EXTRACTION      There were no vitals filed for this visit.   Subjective Assessment - 01/08/22 1501     Subjective feeling the same    Pertinent History right hip fx s/p THA and required revision, COPD, A-fib, CHF    Patient Stated Goals decrease right hip pain, improve strength/balance/walking                               [facility] Adult PT Treatment/Exercise - 01/08/22 0001       Knee/Hip Exercises: Seated   Long Arc Quad Strengthening;2 sets;15 reps    Long Arc Quad Limitations red TB    Ball Squeeze 3x10, 3 sec    Hamstring Curl Strengthening;Right;10 reps;2 sets     Hamstring Limitations red t-loop    Abduction/Adduction  Strengthening;Both;10 reps;2 sets    Abd/Adduction Limitations red t-loop around ankles and then knees, using long-handled reacher to position loop                 Balance Exercises - 01/08/22 0001       Balance Exercises: Standing   Standing Eyes Opened Foam/compliant surface;Wide (BOA);5 reps    Standing Eyes Closed Wide (BOA);Foam/compliant surface;3 reps;10 secs    Standing, One Foot on a Step Eyes open;8 inch;3 reps;20 secs   LLE in order to stretch right anterior hip                 PT Short Term Goals - 12/21/21 1501       PT SHORT TERM GOAL #1   Title Patient will be independent in HEP to improve functional outcomes    Baseline poor recall; reports not consistently doing. Will reconfigure HEP    Time 4    Period Weeks    Status On-going    Target Date 11/23/21      PT SHORT TERM GOAL #2   Title Demonstrate right hip flexion to 90 degrees and right knee flexion to 100 degrees to improve body mechanics for transfers    Baseline 70 degrees hip flexion, 92 right knee flexion-- no change; 12/11/2021-R hip flexion in sitting 85 degrees    Time 4    Period Weeks    Status Not Met    Target Date 11/23/21      PT SHORT TERM GOAL #3   Title Manifest improved BLE strength per time of 30 sec 5xSTS test    Baseline 38.56" from elevated EOM; 59.44 sec 18" seat dependent on BUE support from arm rests; 54.84 sec 12/11/2021;    Time 4    Period Weeks    Status Not Met    Target Date 11/23/21               PT Long Term Goals - 12/21/21 1502       PT LONG TERM GOAL #1   Title Demo reduced risk for falls per TUG test time 20 sec    Baseline 59.03 sec; 12/11/21: 75.90, 75.34 sec; 68 sec on 12/21/21    Time 4    Period Weeks    Status On-going    Target Date 01/18/22      PT LONG TERM GOAL #  2   Title Manifest improved BLE strength per time 25 sec 5xSTS    Baseline 54.84 sec 12/11/21; 56.12 sec on  12/21/21    Time 4    Period Weeks    Status On-going    Target Date 01/18/22                   Plan - 01/08/22 1555     Clinical Impression Statement Continuing to require cues for HEP instruction with [place] PRE using red t-band.  Demo LOB when standing on compliant surfaces especially with head turns and with bias to RLE. Continued sessions to refine HEP and progress balance/coordination activities to improve sfaety with ADL and functional mobility    Personal Factors and Comorbidities Age;Comorbidity 3+;Time since onset of injury/illness/exacerbation    Comorbidities right LE orthopedics, COPD, cardiac conditions    Examination-Activity Limitations Carry;Dressing;Lift;Toileting;Stand;Stairs;Squat;Locomotion Level;Transfers    Examination-Participation Restrictions Cleaning;Community Activity;Driving;Yard Work;Occupation;Meal Prep    Stability/Clinical Decision Making Evolving/Moderate complexity    Rehab Potential Fair    PT Frequency 2x / week    PT Duration 8 weeks    PT Treatment/Interventions ADLs/Self Care Home Management;Aquatic Therapy;Electrical Stimulation;DME Instruction;Gait training;Stair training;Functional mobility training;Therapeutic activities;Therapeutic exercise;Balance training;Neuromuscular re-education;Manual techniques;Passive range of motion;Dry needling;Energy conservation;Joint Manipulations;Vestibular;Taping;Patient/family education    PT Next Visit Plan seated LAQ and hamstring curl with red t-loop, HEP review    PT Home Exercise Plan [id number] seated knee flexion AAROM, hip add squeeze, standing with LLE elevated on 6" step, heel slides with strap, hip abd with red t-loop, sidestepping with t-loop    Consulted and Agree with Plan of Care Patient             Patient will benefit from skilled therapeutic intervention in order to improve the following deficits and impairments:  Abnormal gait, Decreased activity tolerance, Decreased balance, Decreased  mobility, Decreased range of motion, Decreased strength, Difficulty walking, Impaired flexibility, Improper body mechanics, Pain  Visit Diagnosis: Muscle weakness (generalized)  Unsteadiness on feet  Other abnormalities of gait and mobility  Difficulty in walking, not elsewhere classified     Problem List Patient Active Problem List   Diagnosis Date Noted   Palliative care encounter 07/27/2021   Constipation 07/27/2021   Cricopharyngeal achalasia 06/24/2021   Disorder of cervical esophageal region 06/23/2021   Pressure injury of skin 06/21/2021   Physical deconditioning 06/21/2021   SOB (shortness of breath) 06/18/2021   Acute on chronic respiratory failure with hypoxia (HCC) 06/18/2021   Chronic diastolic CHF (congestive heart failure) ([place]) 06/18/2021   Chronic anemia 06/18/2021   COVID-19 virus infection 06/17/2021   Chronic pain of right knee 06/20/2020   Abnormality of gait 06/20/2020   Malnutrition of moderate degree 02/25/2020   S/P percutaneous endoscopic gastrostomy (PEG) tube placement (HCC)    Post-operative pain    Subtherapeutic international normalized ratio (INR)    Debility 02/17/2020   Mesenteric ischemia (HCC)    Sepsis ([place]) 02/08/2020   Small bowel ischemia ([place]) 02/08/2020   Thrombocytosis 02/08/2020   Abnormal CT of the abdomen    Periprosthetic fracture around internal prosthetic hip joint 11/16/2019   Periprosthetic fracture around internal prosthetic hip joint, initial encounter    Chronic obstructive pulmonary disease (HCC)    Essential hypertension    Chronic anticoagulation    Acute blood loss anemia    Transaminitis    Hypoalbuminemia due to protein-calorie malnutrition ([place])    Labile blood pressure    Drug induced constipation    Postoperative  pain    Subcapital fracture of femur ([place]) 10/30/2019   Post-operative state    Prerenal azotemia    Leukocytosis    Closed right hip fracture, initial encounter ([place]) 10/27/2019    Hoarseness 02/19/2018   Sensorineural hearing loss (SNHL) of both ears 02/19/2018   Tinnitus, bilateral 02/19/2018   Cough 09/06/2017   Cough variant asthma 08/19/2017   Acute bronchitis 02/20/2016   Pulmonary air trapping 01/04/2016   Encounter for preoperative pulmonary examination 01/04/2016   NSIP (nonspecific interstitial pneumonia) ([place]) 09/26/2015   Chronic sinusitis 09/26/2015   ILD (interstitial lung disease) ([place]) 07/12/2014   Upper airway cough syndrome 09/23/2013   Encounter for therapeutic drug monitoring 08/11/2013   Congestive heart failure ([place]) 07/08/2013   Coronary artery calcification seen on CAT scan 03/15/2013   Nodule of left lung 03/15/2013   Orthostatic lightheadedness 02/09/2013   Depression    Diverticulitis 01/01/2012   GERD (gastroesophageal reflux disease) 01/01/2012   Hypothyroid 11/23/2010   Edema 10/04/2010   Bronchiectasis ([place])    HYPERTENSION, BENIGN 10/22/2008   A-fib ([place])     Toniann Fail, PT 01/08/2022, 3:57 PM  [facility] [address] [place], [place], [address] Phone: [phone]   Fax:  [phone]  Name: TKEYA STENCIL MRN: [id number] Date of Birth: [birth year]

## 2022-01-11 ENCOUNTER — Ambulatory Visit: Payer: Medicare Other

## 2022-01-11 DIAGNOSIS — R262 Difficulty in walking, not elsewhere classified: Secondary | ICD-10-CM

## 2022-01-11 DIAGNOSIS — M6281 Muscle weakness (generalized): Secondary | ICD-10-CM

## 2022-01-11 DIAGNOSIS — R2689 Other abnormalities of gait and mobility: Secondary | ICD-10-CM

## 2022-01-11 DIAGNOSIS — R2681 Unsteadiness on feet: Secondary | ICD-10-CM | POA: Diagnosis not present

## 2022-01-11 NOTE — Therapy (Signed)
[facility] [address] [place], [place], [address] Phone: [phone]   Fax:  [phone]  Physical Therapy Treatment  Patient Details  Name: Stephanie Cortez MRN: [id number] Date of Birth: [birth year] Referring Provider (PT): Brand Males, MD   Encounter Date: 01/11/2022   PT End of Session - 01/11/22 1214     Visit Number 17    Number of Visits 21    Date for PT Re-Evaluation 01/18/22    Authorization Type Medicare A and B/ [employer] Supplemental 2023    Progress Note Due on Visit 22    PT Start Time 1145    PT Stop Time 1230    PT Time Calculation (min) 45 min    Activity Tolerance Patient tolerated treatment well    Behavior During Therapy [facility] for tasks assessed/performed             Past Medical History:  Diagnosis Date   Anemia    - Hgb 9.7gm% on 07/13/2008 in [place] 129gm% wiht normal irone levsl and ferritin 10/27/2008 in [place] Recurrent otitis/sinusitis   Anxiety    chronic BZ prn   Atrial fibrillation ([place])    chronic anticoag   Bronchiectasis    >PFT 07/13/2008 in [place] 1.9L/76%, FVC 2.45L/74, Ratio 79, TLC 121%, DLCO 64%  AE BRonchiectasis - Dec 2010.New Rx:  outpatient - Feb 2011 - Rx outpatient   CHF (congestive heart failure) ([place])    COPD (chronic obstructive pulmonary disease) (HCC)    bronchiectasis   Cricopharyngeal achalasia    Depression    Diverticulosis    Dyslipidemia    Eczema    Fatty liver    GERD with stricture    Glaucoma    H. 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[place])    Ventral hernia    Vertigo    Wears glasses     Past Surgical History:  Procedure Laterality Date   BOWEL RESECTION N/A 02/08/2020   Procedure: SMALL BOWEL RESECTION;  Surgeon: Donnie Mesa, MD;  Location: [facility];  Service: General;   Laterality: N/A;   BREAST SURGERY     br bx   CARDIAC CATHETERIZATION  07/01/2013   CATARACT EXTRACTION     both   COLONOSCOPY     ESOPHAGOSCOPY W/ BOTOX INJECTION  12/11/2011   Procedure: ESOPHAGOSCOPY WITH BOTOX INJECTION;  Surgeon: Rozetta Nunnery, MD;  Location: [facility];  Service: ENT;  Laterality: N/A;  esophogoscopy with dilation, botox injection   FOOT SURGERY  03/14/2011   gastroc slide-rt   GASTROSTOMY N/A 02/08/2020   Procedure: INSERTION OF GASTROSTOMY TUBE;  Surgeon: Donnie Mesa, MD;  Location: [facility];  Service: General;  Laterality: N/A;   HIP ARTHROPLASTY Right 10/28/2019   Procedure: ARTHROPLASTY BIPOLAR HIP (HEMIARTHROPLASTY);  Surgeon: Marybelle Killings, MD;  Location: [facility];  Service: Orthopedics;  Laterality: Right;   LAPAROTOMY N/A 02/08/2020   Procedure: EXPLORATORY LAPAROTOMY;  Surgeon: Donnie Mesa, MD;  Location: [facility];  Service: General;  Laterality: N/A;   LYSIS OF ADHESION N/A 02/08/2020   Procedure: LYSIS OF ADHESIONS;  Surgeon: Donnie Mesa, MD;  Location: [facility];  Service: General;  Laterality: N/A;   TOTAL ABDOMINAL HYSTERECTOMY  TOTAL HIP REVISION Right 11/16/2019   Procedure: TOTAL HIP REVISION BIPOLAR TO CEMENTED BIPOLAR;  Surgeon: Marybelle Killings, MD;  Location: [facility];  Service: Orthopedics;  Laterality: Right;   WISDOM TOOTH EXTRACTION      There were no vitals filed for this visit.   Subjective Assessment - 01/11/22 1214     Subjective Moving a little slow today, feeling sore for some reason. Have not had change in activity    Pertinent History right hip fx s/p THA and required revision, COPD, A-fib, CHF    Patient Stated Goals decrease right hip pain, improve strength/balance/walking                               [facility] Adult PT Treatment/Exercise - 01/11/22 0001       Knee/Hip Exercises: Seated   Long Arc Quad Strengthening;2 sets;15 reps    Long Arc Quad Limitations red TB    Heel Slides AAROM;Right;3  sets;10 reps    Heel Slides Limitations 5 sec stretch for right quad, achieves 90-95 degrees flexion    Ball Squeeze 3x10, 3 sec    Hamstring Curl Strengthening;Right;10 reps;2 sets    Hamstring Limitations red t-loop    Abduction/Adduction  Strengthening;Both;10 reps;2 sets    Abd/Adduction Limitations red t-loop around ankles and then knees, using long-handled reacher to position loop                 Balance Exercises - 01/11/22 0001       Balance Exercises: Standing   Standing Eyes Opened Wide (BOA);Head turns;Foam/compliant surface;Solid surface;5 reps;3 reps;30 secs    Standing Eyes Closed Wide (BOA);Foam/compliant surface;3 reps;10 secs   LOB after 5 sec   Standing, One Foot on a Step Eyes open;8 inch;3 reps;30 secs   BUE support   Retro Gait 1 rep   2 min in parallel bars   Sidestepping 1 rep;Other (comment)   2 min in parallel bars   Lift / Chop 20 reps   trunk rotation with 3# med ball placing from one rail of parallel bar to other                 PT Short Term Goals - 12/21/21 1501       PT SHORT TERM GOAL #1   Title Patient will be independent in HEP to improve functional outcomes    Baseline poor recall; reports not consistently doing. Will reconfigure HEP    Time 4    Period Weeks    Status On-going    Target Date 11/23/21      PT SHORT TERM GOAL #2   Title Demonstrate right hip flexion to 90 degrees and right knee flexion to 100 degrees to improve body mechanics for transfers    Baseline 70 degrees hip flexion, 92 right knee flexion-- no change; 12/11/2021-R hip flexion in sitting 85 degrees    Time 4    Period Weeks    Status Not Met    Target Date 11/23/21      PT SHORT TERM GOAL #3   Title Manifest improved BLE strength per time of 30 sec 5xSTS test    Baseline 38.56" from elevated EOM; 59.44 sec 18" seat dependent on BUE support from arm rests; 54.84 sec 12/11/2021;    Time 4    Period Weeks    Status Not Met    Target Date 11/23/21  PT Long Term Goals - 12/21/21 1502       PT LONG TERM GOAL #1   Title Demo reduced risk for falls per TUG test time 20 sec    Baseline 59.03 sec; 12/11/21: 75.90, 75.34 sec; 68 sec on 12/21/21    Time 4    Period Weeks    Status On-going    Target Date 01/18/22      PT LONG TERM GOAL #2   Title Manifest improved BLE strength per time 25 sec 5xSTS    Baseline 54.84 sec 12/11/21; 56.12 sec on 12/21/21    Time 4    Period Weeks    Status On-going    Target Date 01/18/22                   Plan - 01/11/22 1246     Clinical Impression Statement Demo improved HEP recall for LE PRE using red t-loop requiring cues 25% of time for sequence and reps.  Progressing with activities for improving balance and facilitate righting reactions with emphasis on head turns and trunk rotation to improve safety with ADL performance and kitchen tasks to reduce risk for falls.  Demo quick onset of LOB with eyes closed on compliant surfaces with LOB after 3-5 sec vs 5-10 sec on firm surface. COntinued sessions to progress LE PRE for HEP development in preparation for D/C    Personal Factors and Comorbidities Age;Comorbidity 3+;Time since onset of injury/illness/exacerbation    Comorbidities right LE orthopedics, COPD, cardiac conditions    Examination-Activity Limitations Carry;Dressing;Lift;Toileting;Stand;Stairs;Squat;Locomotion Level;Transfers    Examination-Participation Restrictions Cleaning;Community Activity;Driving;Yard Work;Occupation;Meal Prep    Stability/Clinical Decision Making Evolving/Moderate complexity    Rehab Potential Fair    PT Frequency 2x / week    PT Duration 8 weeks    PT Treatment/Interventions ADLs/Self Care Home Management;Aquatic Therapy;Electrical Stimulation;DME Instruction;Gait training;Stair training;Functional mobility training;Therapeutic activities;Therapeutic exercise;Balance training;Neuromuscular re-education;Manual techniques;Passive range of  motion;Dry needling;Energy conservation;Joint Manipulations;Vestibular;Taping;Patient/family education    PT Next Visit Plan HEP review wiht red t-loop, trunk rotations while standing on compliant surface    PT Home Exercise Plan [id number] seated knee flexion AAROM, hip add squeeze, standing with LLE elevated on 6" step, heel slides with strap, hip abd with red t-loop, sidestepping with t-loop, LAQ with red loop, hamstring curls with red loop    Consulted and Agree with Plan of Care Patient             Patient will benefit from skilled therapeutic intervention in order to improve the following deficits and impairments:  Abnormal gait, Decreased activity tolerance, Decreased balance, Decreased mobility, Decreased range of motion, Decreased strength, Difficulty walking, Impaired flexibility, Improper body mechanics, Pain  Visit Diagnosis: Muscle weakness (generalized)  Unsteadiness on feet  Other abnormalities of gait and mobility  Difficulty in walking, not elsewhere classified     Problem List Patient Active Problem List   Diagnosis Date Noted   Palliative care encounter 07/27/2021   Constipation 07/27/2021   Cricopharyngeal achalasia 06/24/2021   Disorder of cervical esophageal region 06/23/2021   Pressure injury of skin 06/21/2021   Physical deconditioning 06/21/2021   SOB (shortness of breath) 06/18/2021   Acute on chronic respiratory failure with hypoxia (HCC) 06/18/2021   Chronic diastolic CHF (congestive heart failure) ([place]) 06/18/2021   Chronic anemia 06/18/2021   COVID-19 virus infection 06/17/2021   Chronic pain of right knee 06/20/2020   Abnormality of gait 06/20/2020   Malnutrition of moderate degree 02/25/2020   S/P percutaneous endoscopic gastrostomy (  PEG) tube placement ([place])    Post-operative pain    Subtherapeutic international normalized ratio (INR)    Debility 02/17/2020   Mesenteric ischemia (HCC)    Sepsis ([place]) 02/08/2020   Small bowel ischemia  ([place]) 02/08/2020   Thrombocytosis 02/08/2020   Abnormal CT of the abdomen    Periprosthetic fracture around internal prosthetic hip joint 11/16/2019   Periprosthetic fracture around internal prosthetic hip joint, initial encounter    Chronic obstructive pulmonary disease (HCC)    Essential hypertension    Chronic anticoagulation    Acute blood loss anemia    Transaminitis    Hypoalbuminemia due to protein-calorie malnutrition (HCC)    Labile blood pressure    Drug induced constipation    Postoperative pain    Subcapital fracture of femur ([place]) 10/30/2019   Post-operative state    Prerenal azotemia    Leukocytosis    Closed right hip fracture, initial encounter ([place]) 10/27/2019   Hoarseness 02/19/2018   Sensorineural hearing loss (SNHL) of both ears 02/19/2018   Tinnitus, bilateral 02/19/2018   Cough 09/06/2017   Cough variant asthma 08/19/2017   Acute bronchitis 02/20/2016   Pulmonary air trapping 01/04/2016   Encounter for preoperative pulmonary examination 01/04/2016   NSIP (nonspecific interstitial pneumonia) ([place]) 09/26/2015   Chronic sinusitis 09/26/2015   ILD (interstitial lung disease) ([place]) 07/12/2014   Upper airway cough syndrome 09/23/2013   Encounter for therapeutic drug monitoring 08/11/2013   Congestive heart failure ([place]) 07/08/2013   Coronary artery calcification seen on CAT scan 03/15/2013   Nodule of left lung 03/15/2013   Orthostatic lightheadedness 02/09/2013   Depression    Diverticulitis 01/01/2012   GERD (gastroesophageal reflux disease) 01/01/2012   Hypothyroid 11/23/2010   Edema 10/04/2010   Bronchiectasis ([place])    HYPERTENSION, BENIGN 10/22/2008   A-fib ([place])     Toniann Fail, PT 01/11/2022, 12:49 PM  [facility] [address] [place], [place], [address] Phone: [phone]   Fax:  [phone]  Name: NASIA CANNAN MRN: [id number] Date of Birth: Jun 29, 1934

## 2022-01-16 ENCOUNTER — Ambulatory Visit: Payer: Medicare Other

## 2022-01-16 ENCOUNTER — Ambulatory Visit: Payer: Medicare Other | Admitting: Internal Medicine

## 2022-01-16 NOTE — Progress Notes (Deleted)
OV 03/24/2015  Chief Complaint  Patient presents with   Follow-up    Pt here after PFT in 01/2015. Pt using nocturnal O2 and has noticed a difference the next day in her breathing. Pt states she has a prod cough with discolored mucus, PND, sinus pressure.    [name] is here for follow-up with her daughter Stephanie Cortez, they are presenting for review of ILD workup. I'd always followed up with her baseline diagnosis of bronchiectasis but it started becoming more apparent that she actually has ILD. 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 According to the radiologist it has been stable since 2014 at least. Autoimmune workup showed elevated angiotensin-converting enzyme and CK but otherwise normal. Since the diagnosis of elevated CK her primary care physician has stopped her statin. There is no follow-up CK on this. We also notices that she had nocturnal desaturations restarted her on oxygen and sent her to pulmonary rehabilitation. These 2 measures haven't helped her immensely. She still continues a Symbicort for the prior diagnosis of bronchiectasis. She is inclined to continue that. She will have a flu shot today. She wants to know more about interstitial lung disease and had lot of questions about it  OV  08/11/2015: Acute office visit:  Patient presents to the office today with complaint of chest congestion and cough with yellow mucus intermittent wheezing and shortness of breath that has been ongoing for about 2-3 weeks, but per patient this has worsened over the last 2-3 days. Patient denies fever, chills, chest pain or hemoptysis. Patient endorses scant bloody mucus from her nose, that she feels this is probably related to her nocturnal oxygen use. She presented to her primary care physician [name] at [facility] and was treated initially with a Z-Pak. When Z-Pak did not resolve patient's symptoms she was then treated with clarithromycin 500 mg twice daily  starting 07/25/2015 for a 10 day treatment period . She is currently still taking this antibiotic. States since starting the clarithromycin that she has noticed some improvement.    OV 09/26/2015  Chief Complaint  Patient presents with   Follow-up    Pt here after seeing SG on 08/10/15 for an acute visit. Pt here after 68mt and PFT. Pt states she is still not feeling well. Pt c/o headache, hoarseness, wheezing, prod cough. Pt denies increase in SOB and f/c/s.    Routine office visit for this lady with ILD/NSIP pattern (previous diagnosis of bronchiectasis and on Symbicort]. Has associated sinus problems. Her daughter Stephanie Frohlichis not with her today.   88year old female presents for follow-up. She is frustrated by her chronic sinus drainage and chronic sinus tension  in the frontal area. This is been going on for many months a few years. She seen Dr. NLucia Cortez ENT and apparently is planning a procedure. In the last several days to a few weeks she's having increasing headache particularly in the last 3 days. However there is no increase in sinus drainage volume or change in color of the sinus drainage [baseline color is much reading between white and yellow and green]  However in the last day or 2 she's increasing cough and wheezing despite taking Symbicort.  She is frustrated by her symptoms. She wants a definite answer currently same time she is reluctant to have surgical lung biopsy. She is also reluctant to have chronic daily prednisone because of side effects. She is also at the same  time frustrated about her extreme level of curiosity and need to search the Internet about her disease condition and then this works her into an anxiety      Spirometry today 09/26/2015 shows FVC 1.55 L/54%, FEV1 1.28 L/59% and a ratio of 83. This comes 6 weeks of her short prednisone burst. This a 300 mL decline in FVC since August 2016 but an improvement by 170 mL since June 2016   6 minute walk test today: Walk  336 m. Resting pulse ox was 98%. Peak exertion pulse ox was 94%. She had back pain but not shortness of breath.  Last CT scan of the chest was June 2016 which showed NSIP pattern  Last CT scan of the sinuses August 2013 that showed mild mucosal edema in the paranasal sinuses    OV 01/04/2016  Chief Complaint  Patient presents with   Follow-up    Breathing has been doing well, same as usual.  Discuss oxygen, has not worn oxygen x 2 weeks.  patient only uses at night, discuss testing to get off oxygen.  Having surgery on spine in August, wants to discuss. Wheezing.    FU NSIP with air trapping and cough/wheeze - on symbicort (prior dx in 2015)  - Routine fu. Had annual HRCT - stable ILD with air trapping x 2 years. Overall well. Does not want to use nocturnal o2. But having wheeze and cough that is stable but says symbicort is not helping. Also, reports saysShe is having spine surgery to the low spine in the next few months by Dr Stephanie Cortez and she wants know her preoperative pulmonary risk. She also wants to know the basis of her interstitial lung disease which we have discussed in the past but at this point in time her main issues symptoms. Walking desaturation test 185 feet 3 laps on room at in the office she did not desaturate   OV 02/20/2016  Chief Complaint  Patient presents with   Acute Visit    Pt c/o prod cough with yellow mucus with scant amount of blood, increase in SOB, wheezing. Pt states the spiriva makes her cough.    FU NSIP with air trapping and cough/wheeze - on symbicort (prior dx in 2015).  Last visit in July 2017 because of air trapping we took her off Symbicort and tried Spiriva and Brio she called on 02/17/2016 increasing cough. She was rotated back to Symbicort. She clearly attributes the worsening cough and symptoms to Spiriva. She feels that after going on Symbicort she is better. However she is having some yellow mucus and feels she is having acute bronchitis. She  has multiple drug allergies listed below. Regarding her interstitial lung disease this is been stable for 2 years. Last visit walking desaturation test did not desaturate. She then had overnight oxygen study 02/07/2016 and results are below and she does not need nocturnal oxygen. Therefore she will not need sleep study  OV 04/04/2017  Chief Complaint  Patient presents with   Follow-up    Pt states that she has been doing well. Breathing is at a stable point at this moment. Occ. cough and occ. SOB. Denies any CP.    Follow-up NSIP with a trapping and cough/wheeze and Symbicort responsive   83-year-ol overall stable. No new complaints. Not seen her in over a year. No interim health issues other than back pain for which she is avoiding back surgery. She is in need of a flu shot. She feels Symbicort is working well  for her and she wants to continue this. Without Symbicort she decompensated. Walking desaturation test 185 feet 3 laps on room air: Resting heart rate 63. Final 197. Resting pulse ox 100%. Final pulse ox 99%.   08/19/2017 acute extended ov/Wert re: acute cough in setting of flare of rhinitis? sinusitis Chief Complaint  Patient presents with   Acute Visit    wheezing,SOB with activity,productive cough with green, thick sputum, has started mucinex   nasal symptoms flared  X one week prior to OV with facial pressure/ nasal congestion  Then worse cough/ wheeze with mucus turning green, some epistaxis but no green nasal d/c Not normally needing any kind of rescue rx / does not even have one  symptoms Worse hs and off and on during  noct / props up on several pillows helps some Not limited by breathing from desired activities  But very sedentary  rec Omnicef 300 mg twice daily x 10 days Prednisone 10 mg take  4 each am x 2 days,   2 each am x 2 days,  1 each am x 2 days and stop  I emphasized that nasal steroids (flonase) ave no immediate benefit in terms of improving symptoms.  To help  them reached the target tissue, the patient should use Afrin two puffs every 12 hours  Work on perfecting inhaler technique:   - late add: should notify coumadin adviser re 10 day course of abx       08/27/2017  Acute extended  ov/Wert re: refractory cough/ wheeze Chief Complaint  Patient presents with   Acute Visit    cough and wheezing are unchanged since last visit. She is coughing up some green sputum- still taking omnicef.    Dyspnea:  No really sob Cough: less in volume, still green, some from nose/mostly from chest/ wakes her up each am / worse also when head hit pillow  Sleep: poorly / 45 degrees SABA use:  N/a Has flutter valve not suing   OV 11/20/2017    Chief Complaint  Patient presents with   Follow-up    Pt supposed to have PFT but was unable to show for it. Pt saw MW twice February 2019 due to a cough. Pt states her cough is now better. Has some mild SOB.    Follow-up NSIP with a trapping and cough/wheeze and Symbicort responsive    Follow-up interstitial lung disease associated with air trapping and Symbicort responsive.  She presents for follow-up.  Since I saw her last in October 2018 she presented 2 times in February 2019 acutely to this office and treated with Omnicef and prednisone and subsequently symptomatic treatment.  Followed up with nurse practitioner in March.  Pulmonary function test recommended for today's visit but because of delays in her previous appointment at the Coumadin clinic she could not get it done.  But at this point in time she feels her cough is resolved back to baseline she feels good.  She feels Symbicort is helping her a lot.  Walking desaturation test today is essentially close to normal other than mild desaturation to 3 points.  There are no new issues.   OV 03/06/2018  Subjective:  Patient ID: Stephanie Cortez, female , DOB: July 20, 1933 , age 39 y.o. , MRN: [id number] , ADDRESS: [place] [address]   03/06/2018 -    Chief Complaint  Patient presents with   Follow-up    Pt states she has been doing okay since last visit and  is mainly here today to discuss prognosis and wants more information abotu her diagnosis. Pt states she has not been sleeping well and states she still has the dry cough. Pt states SOB is stable.     HPI AMBERT VIRRUETA 88 y.o. -ast seen in May 2019. She was supposed to see her back in October 2019 with monitoring pulmonary function testing and high risk CT for her ILD not otherwise specified and associated possible cough variant asthma. Howevershe started thinking about this appointment and got extremely worried and anxious. She started looking at the Internet and she thought she might have IPF and she would die in the next few years.Therefore she is here to discuss this. Overall she's stable. She'll have a high dose flu shot. There are no new complaints. Back pain bothers her more This no worsening in dyspnea. There is no wheeze or cough     ROS - per HPI     OV 06/01/2019  Subjective:  Patient ID: Stephanie Cortez, female , DOB: February 01, 1934 , age 42 y.o. , MRN: [id number] , ADDRESS: [place] [address]   06/01/2019 -   Chief Complaint  Patient presents with   Follow-up    Pt states she has been doing okay since last visit. States she will become breathless faster than before.    Follow-up history of asthma and interstitial lung disease not otherwise specified  HPI Stephanie Cortez 47 y.o. -returns for follow-up.  Last seen September 2019.  At that point in time it was becoming more and more apparent that she had definite interstitial lung disease.  It was stable.  She was worried it might be IPF although the pattern did not fit.  Nevertheless she was stable.  I did recommend to her that she have a high-resolution CT chest and pulmonary function test as soon as possible and return in October 2019.  However she never did.  She does not have a satisfactory  answer as to why she did did not return.  It appears that was the end of 2019 her sister in [place] got ill and by 07/24/18 she passed away.  Then COVID-19 pandemic here.  She says she has been social distancing at home.  Her daughter and visits with her.  She had a limited Thanksgiving with 6 people.  Everybody is doing well after Thanksgiving.  She has been masking as well.  Nevertheless she feels overall her shortness of breath is declined steadily in the last 1 year.  Symptom score is listed below.  In walking desaturation test she was only able to do 2 out of the 3 laps.  A year ago she was able to do all 3 laps.  Her pulse ox drop is worse than before.  She also has chronic cough and postnasal drainage and this is stable.  Most recent pulmonary function test and CT scan numbers are listed below.  Symptom score is listed below.       IMPRESSION: 1. Interstitial lung disease characterized by upper lobe predominant patchy peribronchovascular and subpleural reticulation and faint ground-glass attenuation with associated mild traction bronchiectasis. No frank honeycombing. Extensive air trapping. These findings have not appreciably changed and are most consistent with chronic hypersensitivity pneumonitis. These findings are not consistent with usual interstitial pneumonia (UIP). 2. Two vessel coronary atherosclerosis.  Stable mild cardiomegaly. 3. Aortic atherosclerosis.     Electronically Signed   By: Ilona Sorrel M.D.   On: 12/26/2015 15:44  Results for DESTANE, SPEAS (MRN [id number]) as of 06/01/2019 10:58  Ref. Range 12/22/2014 12:33  Anti Nuclear Antibody (ANA) Latest Ref Range: NEGATIVE  NEG  Angiotensin-Converting Enzyme Latest Ref Range: 8 - 52 U/L 77 (H)  Cyclic Citrullin Peptide Ab Latest Ref Range: 0.0 - 5.0 U/mL <2.0  ds DNA Ab Latest Units: IU/mL <1  Myeloperoxidase Abs Latest Ref Range: <1.0 AI  <1.0  Serine Protease 3 Latest Ref Range: <1.0 AI  <1.0  RA Latex  Turbid. Latest Ref Range: <=14 IU/mL 13  Ribonucleic Protein(ENA) Antibody, IgG Latest Ref Range: <1.0 NEG AI  <1.0 NEG  SSA (Ro) (ENA) Antibody, IgG Latest Ref Range: <1.0 NEG AI  <1.0 NEG  SSB (La) (ENA) Antibody, IgG Latest Ref Range: <1.0 NEG AI  <1.0 NEG  Scleroderma (Scl-70) (ENA) Antibody, IgG Latest Ref Range: <1.0 NEG AI  <1.0 NEG     OV 09/03/2019  Subjective:  Patient ID: Stephanie Cortez, female , DOB: March 21, 1934 , age 88 y.o. , MRN: [id number] , ADDRESS: [place] [address]   09/03/2019 -   Chief Complaint  Patient presents with   Follow-up    Pt states she wears O2 at night which she states is helping. Pt will still become SOB with exertion.   Follow-up interstitial lung disease not otherwise specified. MDD discussion 08/11/2019: Very concerning for hypersensitive pneumonitis.  HPI TYLIA EWELL 88 y.o. -last visit in our 2020 she reported worsening shortness of breath.  I was worried that her ILD was getting worse.  Therefore she did a high-resolution CT chest and it shows significant worsening especially with development of upper lobe groundglass opacities inflammatory findings with air trapping.  Very suggestive of hypersensitive pneumonitis.  She tells me that ever since moving from [place] to [facility] she is lived in the same home for 10 years.  In the year of COVID-19 in 2020 she has been mostly homebound.  There is mold in the basement and the basement is damp but she never goes there.  So she is puzzled about these findings.   [facility] Comprehensive ILD Questionnaire  Symptoms:  -Insidious onset of shortness of breath that is getting worse for the last several months.  Severity of dyspnea listed below.  She also has a cough from time to time.  The cough has been going on for years.  The cough is the same and ranges anywhere from mild to moderate.  She does cough at night some but not often.  She also brings up some phlegm but  not often.  The phlegm is clear to slightly yellow.  Never had hemoptysis does clear the throat.  Occasional wheezing present.  No nausea vomiting or diarrhea.     Past Medical History : She got diagnosis COPD but she has ILD.  Likewise she got diagnosed with asthma but she has ILD.  There is in the past she has had diastolic heart failure and she has atrial fibrillation on Coumadin.  She has thyroid disease for the last few decades.  No collagen vascular disease no lupus no polymyositis no Sjogren's.  No diabetes n   ROS: Positive for arthralgia and fatigue.  No dysphagia no dry eyes no Raynaud's no nausea no vomiting.  Does have acid reflux occasionally.   FAMILY HISTORY of LUNG DISEASE: Denies.  Denies pulmonary fibrosis in the family.   EXPOSURE HISTORY: No smoke exposure.  No cigarettes no vaping no cocaine no marijuana no IV  drug use no electronic cigarettes.   HOME and HOBBY DETAILS : She lives in a home that was built in the 82s.  She is lived in this home for 12 years in [place] single-family.  The basement is damp and she thinks there is mold in it but she never goes there.  There is no mold or mildew in the shower curtain no mold or mildew in the bathroom does not use a humidifier.  No CPAP use no nebulizer use no steam iron use no misting Fountain use.  No pet birds or parakeets no pet gerbils.  She does not use any feather pillows or duvet.  She is not tested of any mold in the [facility] duct system so she does not know/does not play any wind instruments.  No guarding habits.   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  Denies completely   PULMONARY TOXICITY HISTORY (27 items): She took amiodarone for short.  At age 4 but otherwise negative.        Results for SHONIA, SKILLING (MRN [id number]) as of 11/20/2017 11:40  Ref. Range 12/22/2014 10:11 03/02/2015 16:52 09/26/2015 10:04  FVC-Pre Latest Units: L 1.38 1.88 1.55  FVC-%Pred-Pre  Latest Units: % 47 65 54   Results for JALEI, SHIBLEY (MRN [id number]) as of 11/20/2017 11:40  Ref. Range 12/22/2014 10:11 03/02/2015 16:52  DLCO unc Latest Units: ml/min/mmHg 17.07 16.90  DLCO unc % pred Latest Units: % 63 62     IMPRESSION: 1. The appearance of the lungs is compatible with interstitial lung disease, with a spectrum of findings considered most compatible with an alternative diagnosis to usual interstitial pneumonia (UIP) per current ATS guidelines. Specifically, imaging findings are most suggestive of progressive chronic hypersensitivity pneumonitis. 2. Aortic atherosclerosis, in addition to left main and right coronary artery disease. 3. Dilatation of the pulmonic trunk (3.6 cm in diameter), concerning for pulmonary arterial hypertension. 4. Cardiomegaly with biatrial dilatation (right greater than left).   Aortic Atherosclerosis (ICD10-I70.0).     Electronically Signed   By: Vinnie Langton M.D.   On: 07/17/2019 15:19  ROS - per HPI   OV 10/20/2020  Subjective:  Patient ID: Stephanie Cortez, female , DOB: 02/25/1934 , age 89 y.o. , MRN: [id number] , ADDRESS: [address] [place] [address] PCP [name], MD Patient Care Team: [name], MD as PCP - General (Family Medicine) Deboraha Sprang, MD as PCP - Cardiology (Cardiology) Deboraha Sprang, MD (Cardiology) Sable Feil, MD (Gastroenterology) Brand Males, MD (Pulmonary Disease) Renato Shin, MD (Endocrinology) Erline Levine, MD (Neurosurgery) Weber Cooks, MD (Inactive) (Orthopedic Surgery) Rozetta Nunnery, MD (Otolaryngology)  This Provider for this visit: Treatment Team:  Attending Provider: Brand Males, MD  Follow-up interstitial lung disease of alternative to UIP pattern.  Stable for many years but between 2020 and 2021 progressive.  Concern for hypersensitive pneumonitis  Last CT chest January 2021 Last PFT March 2021  10/20/2020 -   Chief  Complaint  Patient presents with   Follow-up    Reports concern that breathing has gotten more shallow over past year. Still wants to get scheduled for bronchoscopy. Concerned about clear phlegm.      HPI KELLI ROBECK 88 y.o. -last seen in the spring 2021.  At that time because of progressive ILD and concern for hypersensitive pneumonitis we discussed s doing a bronchoscopy with lavage to help narrow the differential diagnosis.  However this  did not happen..  Initially patient was concerned but later by April 2021 she fractured her hip.  Since then she has not followed up.  Today she presents with her daughter and.  They both tell me that after the fracture she went to inpatient rehab.  And that she sustained a recurrent fracture and had to have another surgery in the month of May/June 2021.  Subsequently went home with PT.  After that in August 2021 also had bowel obstruction presumably due to opioids and had to have laparotomy.  Since then she has been doing home physical therapy and she is graduating from that.  Because of although she is lost a lot of weight and she is frail.  However she has never needed oxygen.  She not even using oxygen at night and is questioning whether she needs it 1 year ago she told me that actually helped her.  She continues with the Symbicort.  She does have crackles in the lung and intermittent squeaks which is baseline.  However she does not feel this.  She is back now because she wants to reestablish with myself at the ILD center.  She is interested in bronchoscopy.  She is also interested in getting better and stronger.  ILD symptom score and simple walking desaturation test is deferred at this visit   OV 12/19/2020  Subjective:  Patient ID: Stephanie Cortez, female , DOB: Sep 03, 1933 , age 16 y.o. , MRN: [id number] , ADDRESS: [address] [place] [address] PCP [name], MD Patient Care Team: [name], MD as PCP - General (Family  Medicine) Deboraha Sprang, MD as PCP - Cardiology (Cardiology) Deboraha Sprang, MD (Cardiology) Sable Feil, MD (Gastroenterology) Brand Males, MD (Pulmonary Disease) Renato Shin, MD (Endocrinology) Erline Levine, MD (Neurosurgery) Weber Cooks, MD (Inactive) (Orthopedic Surgery) Rozetta Nunnery, MD (Otolaryngology)  This Provider for this visit: Treatment Team:  Attending Provider: Brand Males, MD    12/19/2020 -   Chief Complaint  Patient presents with   Follow-up    PFT performed today. Pt states that her breathing feels worse since last visit.   Follow-up interstitial lung disease type/clinical hypersensitive pneumonitis progressive phenotype -ATS 2022 progressive pulmonary fibrosis [PPF]  HPI YISEL MEGILL 88 y.o. -returns for follow-up.  Her daughter and accompanies her.  Since her last visit she is undergoing physical therapy and rehab she is gotten stronger has gained weight but still using a walker.  When we walked her today she only walked 1 lap dropped 8 points.  She could not walk any further.  She had pulmonary function test and high-resolution CT chest.  Both showed progressive pulmonary fibrosis.  She meets definition for this by ATS 2022 criteria.  We discussed this and the possibility that the etiology being hypersensitive pneumonitis.  We discussed that prednisone would be first-line treatment but given her frailty and hip fractures and age 88 we both took a shared decision making this not an ideal fit for her.  We next turned the conversation to nintedanib.  She is on Eliquis she has history of diverticulitis.  Explained high probably of diarrhea and the black box warnings of slight cardiac risk, bleeding risk and diverticulitis flareups.  Also fatigue.  She is not interested in this because of the side effects.  Explained the preventative nature.  At this point in time she prefers supportive care.  She wants to continue to get stronger and then  reassess nintedanib.  We ordered a  overnight oxygen study last time .  She and daughter report that it was done but I do not have the results.  They are willing to get it retested.  Meanwhile she is continue Symbicort because it helps her symptomatically.  PFT  OV 11/14/2021  Subjective:  Patient ID: Stephanie Cortez, female , DOB: September 21, 1933 , age 89 y.o. , MRN: [id number] , ADDRESS: [address] [place] [address] PCP [name], MD Patient Care Team: [name], MD as PCP - General (Family Medicine) Deboraha Sprang, MD as PCP - Cardiology (Cardiology) Deboraha Sprang, MD (Cardiology) Sable Feil, MD (Gastroenterology) Brand Males, MD (Pulmonary Disease) Renato Shin, MD (Endocrinology) Erline Levine, MD (Neurosurgery) Weber Cooks, MD (Inactive) (Orthopedic Surgery) Rozetta Nunnery, MD (Inactive) (Otolaryngology)  This Provider for this visit: Treatment Team:  Attending Provider: Brand Males, MD    11/14/2021 -   Chief Complaint  Patient presents with   Follow-up    Pt states she has been coughing up a lot of phlegm. States her cough is worse in the morning and also at night but also will cough throughout the day too.     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  HPI MARSA MATTEO 88 y.o. -returns for follow-up.  Last seen in December 2022.  Then in December 2022 she had COVID-19 and hospitalized.  After that she has been physically deconditioned. She has now been ordered physical therapy and Occupational Therapy.  She has been getting this at our [facility] center.  She has not been on night oxygen she is not on day oxygen.  She lives alone daughter and lives nearby.  In fact daughter and is with her today.  She is able to do a lot of ADLs.  She needs some  assistance with bath.  She uses a walker.  She is continuing to get stronger.  We discussed ways that she could get stronger.  On also feel less fatigue.  We decided that we would retest her overnight pulse oximetry.  She is interested in this.  She is also reporting worsening sputum production for the last few months.  This is after the [facility] admission.  Were not sure if the ILD itself is worse.  As of last year she had progressive phenotype.    Overall frailty continues though though she is stronger. Symptoms socre is actually worse PFT   OV 01/16/2022  Subjective:  Patient ID: Stephanie Cortez, female , DOB: 06/15/1934 , age 88 y.o. , MRN: [id number] , ADDRESS: [address] [place] [address] PCP [name], MD Patient Care Team: [name], MD as PCP - General (Family Medicine) Deboraha Sprang, MD as PCP - Cardiology (Cardiology) Deboraha Sprang, MD (Cardiology) Sable Feil, MD (Gastroenterology) Brand Males, MD (Pulmonary Disease) Renato Shin, MD (Inactive) (Endocrinology) Erline Levine, MD (Neurosurgery) Weber Cooks, MD (Inactive) (Orthopedic Surgery) Rozetta Nunnery, MD (Inactive) (Otolaryngology)  This Provider for this visit: Treatment Team:  Attending Provider: Brand Males, MD    01/16/2022 -  No chief complaint on file.    SYMPTOM SCALE - ILD 06/01/2019  09/03/2019   10/20/2020 Lost lot of weight. Uses walker. Frail.  12/19/2020 ra 11/14/2021   O2 use RA Night o2 O Not using nigh 02   Shortness of Breath 0 -> 5 scale with 5 being worst (score 6 If unable to do)  At rest 0.25 0  0 1  Simple tasks - showers, clothes change, eating, shaving 1 0  0 2  Household (dishes, doing bed, laundry) 1 0  0 2  Shopping 2 1.5  x x  Walking level at own pace 1 0  1 2  Walking up Stairs 2.'5 3  3 3  '$ Total (40 - 48) Dyspnea Score 7.75 4.'5  4 10  '$ How bad is your cough? slight 1.5  1 0  How bad is your fatigue mld 1.5  0 2  nause  0  0 0   vomit  0  0 2.5  darrhea  0  0 0  anxiety  1  0 0  depressio  0  0 x      Simple office walk 185 feet x  3 laps goal with forehead probe 11/20/2017  06/01/2019  09/03/2019  12/19/2020   O2 used Room air RA ra ra  Number laps completed 3 laps 3 laps -was goal. Stopped at 2 laps due to severe dyspnea 3 laps goal but only walked1.5 laps  3 lap goal.  Did only 1 lap  Comments about pace Slow pace Slow pace avg pace Slow with walker  Resting Pulse Ox/HR 100% and 58/min 99% and 70.min 100% and 78/min 99% and 110  Final Pulse Ox/HR 97% and 88/min 93% and 110/min 92% and 104/min 91% and 125  Desaturated </= 88% no no no   Desaturated <= 3% points yes Yes, 6 points Yes, 8 points Yyes 8 points  Got Tachycardic >/= 90/min no yes yes   Symptoms at end of test Mild dyspnea and back pain Moderate dyspnea and stopped prematurely Severe dyspnea, stopped prematureley Mild dyspnea  Miscellaneous comments Mild antalgic gait Back and calf pain too       HPI Stephanie Cortez 14 y.o. -    CT Chest data  No results found.    PFT     Latest Ref Rng & Units 12/19/2020    1:47 PM 09/23/2019    1:08 PM 09/26/2015   10:04 AM 03/02/2015    4:52 PM 12/22/2014   10:11 AM  PFT Results  FVC-Pre L 1.08  1.30  1.55  1.88  1.38   FVC-Predicted Pre % 40  48  54  65  47   FVC-Post L  1.31   1.82  1.53   FVC-Predicted Post %  48   63  53   Pre FEV1/FVC % % 88  86  83  79  86   Post FEV1/FCV % %  87   84  88   FEV1-Pre L 0.95  1.13  1.28  1.48  1.19   FEV1-Predicted Pre % 48  56  59  68  55   FEV1-Post L  1.15   1.54  1.35   DLCO uncorrected ml/min/mmHg 7.79  9.24   16.90  17.07   DLCO UNC% % 39  46   62  63   DLCO corrected ml/min/mmHg 7.79  9.24      DLCO COR %Predicted % 39  46      DLVA Predicted % 99  101   103  111   TLC L    3.84  4.04   TLC % Predicted %    71  75   RV % Predicted %    83  89        has a past medical history of Anemia, Anxiety,  Atrial fibrillation ([place]), Bronchiectasis,  CHF (congestive heart failure) ([place]), COPD (chronic obstructive pulmonary disease) ([place]), Cricopharyngeal achalasia, Depression, Diverticulosis, Dyslipidemia, Eczema, Fatty liver, GERD with stricture, Glaucoma, H. pylori infection, Hypertension, Hyponatremia, Hypothyroid, IBS (irritable bowel syndrome), Lumbar disc disease, Neuropathy of both feet, Segmental colitis ([place]), Ventral hernia, Vertigo, and Wears glasses.   reports that she has never smoked. She has never used smokeless tobacco.  Past Surgical History:  Procedure Laterality Date   BOWEL RESECTION N/A 02/08/2020   Procedure: SMALL BOWEL RESECTION;  Surgeon: Donnie Mesa, MD;  Location: [facility];  Service: General;  Laterality: N/A;   BREAST SURGERY     br bx   CARDIAC CATHETERIZATION  07/01/2013   CATARACT EXTRACTION     both   COLONOSCOPY     ESOPHAGOSCOPY W/ BOTOX INJECTION  12/11/2011   Procedure: ESOPHAGOSCOPY WITH BOTOX INJECTION;  Surgeon: Rozetta Nunnery, MD;  Location: [facility];  Service: ENT;  Laterality: N/A;  esophogoscopy with dilation, botox injection   FOOT SURGERY  03/14/2011   gastroc slide-rt   GASTROSTOMY N/A 02/08/2020   Procedure: INSERTION OF GASTROSTOMY TUBE;  Surgeon: Donnie Mesa, MD;  Location: [facility];  Service: General;  Laterality: N/A;   HIP ARTHROPLASTY Right 10/28/2019   Procedure: ARTHROPLASTY BIPOLAR HIP (HEMIARTHROPLASTY);  Surgeon: Marybelle Killings, MD;  Location: [facility];  Service: Orthopedics;  Laterality: Right;   LAPAROTOMY N/A 02/08/2020   Procedure: EXPLORATORY LAPAROTOMY;  Surgeon: Donnie Mesa, MD;  Location: [facility];  Service: General;  Laterality: N/A;   LYSIS OF ADHESION N/A 02/08/2020   Procedure: LYSIS OF ADHESIONS;  Surgeon: Donnie Mesa, MD;  Location: [facility];  Service: General;  Laterality: N/A;   TOTAL ABDOMINAL HYSTERECTOMY     TOTAL HIP REVISION Right 11/16/2019   Procedure: TOTAL HIP REVISION BIPOLAR TO CEMENTED BIPOLAR;  Surgeon: Marybelle Killings, MD;  Location: [facility];   Service: Orthopedics;  Laterality: Right;   WISDOM TOOTH EXTRACTION      Allergies  Allergen Reactions   Doxycycline Other (See Comments)    Rash on face and neck   Hydrocodone Other (See Comments)    Other reaction(s): hallucinations   Vicodin [Hydrocodone-Acetaminophen] Other (See Comments)    Hallucinations    Flagyl [Metronidazole] Nausea And Vomiting   Moxifloxacin Other (See Comments)     Weakness/fatigue   Pantoprazole Sodium Rash   Penicillins Rash    Has patient had a PCN reaction causing immediate rash, facial/tongue/throat swelling, SOB or lightheadedness with hypotension:unsure Has patient had a PCN reaction causing severe rash involving mucus membranes or skin necrosis:unsure Has patient had a PCN reaction that required hospitalization:No Has patient had a PCN reaction occurring within the last 10 years:Yes Cannot recall exact reaction due to time lapse If all of the above answers are "NO", then may proceed with Cephalosporin use.  Other reaction(s): Unknown    Immunization History  Administered Date(s) Administered   Influenza Split 04/06/2011, 04/03/2012   Influenza Whole 06/01/2009, 04/03/2010   Influenza, High Dose Seasonal PF 04/04/2017, 03/06/2018, 03/03/2019   Influenza,inj,Quad PF,6+ Mos 03/10/2013, 03/24/2015   Influenza-Unspecified 04/01/2014   Pneumococcal Conjugate-13 07/28/2014   Pneumococcal Polysaccharide-23 07/04/2006, 06/13/2016   Tdap 07/28/2014    Family History  Problem Relation Age of Onset   Hypertension Mother    Stroke Mother    Emphysema Brother    Other Father        miner's lung   Cancer Father        Lung  Colon polyps Sister    Pancreatic cancer Sister    Kidney disease Sister    Atrial fibrillation Other        siblings     Current Outpatient Medications:    acetaminophen (TYLENOL) 325 MG tablet, Take 2 tablets (650 mg total) by mouth every 6 (six) hours as needed for mild pain (or Fever >/= 101)., Disp: , Rfl:     apixaban (ELIQUIS) 2.5 MG TABS tablet, TAKE ONE TABLET BY MOUTH TWICE DAILY., Disp: 180 tablet, Rfl: 0   cephALEXin (KEFLEX) 500 MG capsule, Take 1 capsule (500 mg total) by mouth 3 (three) times daily., Disp: 21 capsule, Rfl: 0   ciprofloxacin (CIPRO) 500 MG tablet, Take 1 tablet (500 mg total) by mouth 2 (two) times daily., Disp: 14 tablet, Rfl: 0   FLUoxetine (PROZAC) 20 MG capsule, Take 20 mg by mouth daily., Disp: , Rfl:    furosemide (LASIX) 20 MG tablet, Take 20 mg by mouth daily., Disp: , Rfl:    levothyroxine (SYNTHROID) 50 MCG tablet, Take 1 tablet (50 mcg total) by mouth daily., Disp: 90 tablet, Rfl: 3   omeprazole (PRILOSEC) 40 MG capsule, Take 1 capsule (40 mg total) by mouth 2 (two) times daily before a meal., Disp: 90 capsule, Rfl: 2   potassium chloride (KLOR-CON) 8 MEQ tablet, Take 16 mEq by mouth daily., Disp: , Rfl:    pravastatin (PRAVACHOL) 40 MG tablet, Take 1 tablet (40 mg total) by mouth at bedtime., Disp: 30 tablet, Rfl: 0   psyllium (METAMUCIL) 58.6 % packet, Take 1 packet by mouth daily as needed (constipation)., Disp: , Rfl:    SYMBICORT 80-4.5 MCG/ACT inhaler, INHALE 2 PUFFS INTO THE LUNGS TWICE DAILY (Patient taking differently: Inhale 2 puffs into the lungs in the morning and at bedtime.), Disp: 10.2 g, Rfl: 3   traZODone (DESYREL) 50 MG tablet, Take 50 mg by mouth at bedtime as needed., Disp: , Rfl:       Objective:   There were no vitals filed for this visit.  Estimated body mass index is 24.82 kg/m as calculated from the following:   Height as of 11/14/21: '5\' 6"'$  (1.676 m).   Weight as of 11/14/21: 153 lb 12.8 oz (69.8 kg).  '@WEIGHTCHANGE'$ @  There were no vitals filed for this visit.   Physical Exam  General Appearance:    Alert, cooperative, no distress, appears stated age - *** , Deconditioned looking - *** , OBESE  - ***, Sitting on Wheelchair -  ***  Head:    Normocephalic, without obvious abnormality, atraumatic  Eyes:    PERRL, conjunctiva/corneas  clear,  Ears:    Normal TM's and external ear canals, both ears  Nose:   Nares normal, septum midline, mucosa normal, no drainage    or sinus tenderness. OXYGEN ON  - *** . Patient is @ ***   Throat:   Lips, mucosa, and tongue normal; teeth and gums normal. Cyanosis on lips - ***  Neck:   Supple, symmetrical, trachea midline, no adenopathy;    thyroid:  no enlargement/tenderness/nodules; no carotid   bruit or JVD  Back:     Symmetric, no curvature, ROM normal, no CVA tenderness  Lungs:     Distress - *** , Wheeze ***, Barrell Chest - ***, Purse lip breathing - ***, Crackles - ***   Chest Wall:    No tenderness or deformity.    Heart:    Regular rate and rhythm, S1 and S2 normal, no rub  or gallop, Murmur - ***  Breast Exam:    NOT DONE  Abdomen:     Soft, non-tender, bowel sounds active all four quadrants,    no masses, no organomegaly. Visceral obesity - ***  Genitalia:   NOT DONE  Rectal:   NOT DONE  Extremities:   Extremities - normal, Has Cane - ***, Clubbing - ***, Edema - ***  Pulses:   2+ and symmetric all extremities  Skin:   Stigmata of Connective Tissue Disease - ***  Lymph nodes:   Cervical, supraclavicular, and axillary nodes normal  Psychiatric:  Neurologic:   Pleasant - ***, Anxious - ***, Flat affect - ***  CAm-ICU - neg, Alert and Oriented x 3 - yes, Moves all 4s - yes, Speech - normal, Cognition - intact    General: No distress. *** Neuro: Alert and Oriented x 3. GCS 15. Speech normal Psych: Pleasant Resp:  Barrel Chest - ***.  Wheeze - ***, Crackles - ***, No overt respiratory distress CVS: Normal heart sounds. Murmurs - *** Ext: Stigmata of Connective Tissue Disease - *** HEENT: Normal upper airway. PEERL +. No post nasal drip        Assessment:     No diagnosis found.     Plan:     There are no Patient Instructions on file for this visit.    SIGNATURE    Dr. Brand Males, M.D., F.C.C.P,  Pulmonary and Critical Care Medicine Staff  Physician, [place] Director - Interstitial Lung Disease  Program  Pulmonary [place] [facility] [place], [place], [address]  Pager: [phone], If no answer or between  15:00h - 7:00h: call [phone] Telephone: [phone]  7:24 AM 01/16/2022

## 2022-01-18 ENCOUNTER — Ambulatory Visit: Payer: Medicare Other | Admitting: Physical Therapy

## 2022-01-18 ENCOUNTER — Encounter: Payer: Self-pay | Admitting: Physical Therapy

## 2022-01-18 DIAGNOSIS — R262 Difficulty in walking, not elsewhere classified: Secondary | ICD-10-CM

## 2022-01-18 DIAGNOSIS — R2689 Other abnormalities of gait and mobility: Secondary | ICD-10-CM

## 2022-01-18 DIAGNOSIS — M6281 Muscle weakness (generalized): Secondary | ICD-10-CM

## 2022-01-18 DIAGNOSIS — R2681 Unsteadiness on feet: Secondary | ICD-10-CM | POA: Diagnosis not present

## 2022-01-18 NOTE — Patient Instructions (Signed)
Access Code: [email or web address] URL: [email or web address] Date: 01/18/2022 Prepared by: [facility]  Exercises - Seated Knee Extension with Resistance  - 1 x daily - 7 x weekly - 3 sets - 10 reps - Seated Hamstring Curls with Resistance  - 1 x daily - 7 x weekly - 3 sets - 10 reps - Side Stepping with Resistance at Ankles and Counter Support  - 1 x daily - 7 x weekly - 1-3 sets

## 2022-01-18 NOTE — Therapy (Signed)
[facility] [address] [place], [place], [address] Phone: [phone]   Fax:  [phone]  Physical Therapy Discharge Summary  Patient Details  Name: Stephanie Cortez MRN: [id number] Date of Birth: [birth year] Referring Provider (PT): Brand Males, MD  Progress Note Reporting Period 12/26/21 to 01/18/22  See note below for Objective Data and Assessment of Progress/Goals.    Encounter Date: 01/18/2022   PT End of Session - 01/18/22 1504     Visit Number 18    Number of Visits 21    Date for PT Re-Evaluation 01/18/22    Authorization Type Medicare A and B/ [employer] Supplemental 2023    Progress Note Due on Visit 80    PT Start Time 1409   pt late   PT Stop Time 1452    PT Time Calculation (min) 43 min    Activity Tolerance Patient tolerated treatment well    Behavior During Therapy WFL for tasks assessed/performed             Past Medical History:  Diagnosis Date   Anemia    - Hgb 9.7gm% on 07/13/2008 in [facility] 129gm% wiht normal irone levsl and ferritin 10/27/2008 in [place] Recurrent otitis/sinusitis   Anxiety    chronic BZ prn   Atrial fibrillation (HCC)    chronic anticoag   Bronchiectasis    >PFT 07/13/2008 in [facility] 1.9L/76%, FVC 2.45L/74, Ratio 79, TLC 121%, DLCO 64%  AE BRonchiectasis - Dec 2010.New Rx:  outpatient - Feb 2011 - Rx outpatient   CHF (congestive heart failure) ([place])    COPD (chronic obstructive pulmonary disease) (HCC)    bronchiectasis   Cricopharyngeal achalasia    Depression    Diverticulosis    Dyslipidemia    Eczema    Fatty liver    GERD with stricture    Glaucoma    H. 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[place])    Ventral hernia    Vertigo    Wears glasses     Past Surgical History:  Procedure  Laterality Date   BOWEL RESECTION N/A 02/08/2020   Procedure: SMALL BOWEL RESECTION;  Surgeon: Donnie Mesa, MD;  Location: [facility];  Service: General;  Laterality: N/A;   BREAST SURGERY     br bx   CARDIAC CATHETERIZATION  07/01/2013   CATARACT EXTRACTION     both   COLONOSCOPY     ESOPHAGOSCOPY W/ BOTOX INJECTION  12/11/2011   Procedure: ESOPHAGOSCOPY WITH BOTOX INJECTION;  Surgeon: Rozetta Nunnery, MD;  Location: [facility];  Service: ENT;  Laterality: N/A;  esophogoscopy with dilation, botox injection   FOOT SURGERY  03/14/2011   gastroc slide-rt   GASTROSTOMY N/A 02/08/2020   Procedure: INSERTION OF GASTROSTOMY TUBE;  Surgeon: Donnie Mesa, MD;  Location: [facility];  Service: General;  Laterality: N/A;   HIP ARTHROPLASTY Right 10/28/2019   Procedure: ARTHROPLASTY BIPOLAR HIP (HEMIARTHROPLASTY);  Surgeon: Marybelle Killings, MD;  Location: [facility];  Service: Orthopedics;  Laterality: Right;   LAPAROTOMY N/A 02/08/2020   Procedure: EXPLORATORY LAPAROTOMY;  Surgeon: Donnie Mesa, MD;  Location: [facility];  Service: General;  Laterality: N/A;   LYSIS OF ADHESION N/A 02/08/2020   Procedure:  LYSIS OF ADHESIONS;  Surgeon: Donnie Mesa, MD;  Location: [facility];  Service: General;  Laterality: N/A;   TOTAL ABDOMINAL HYSTERECTOMY     TOTAL HIP REVISION Right 11/16/2019   Procedure: TOTAL HIP REVISION BIPOLAR TO CEMENTED BIPOLAR;  Surgeon: Marybelle Killings, MD;  Location: [facility];  Service: Orthopedics;  Laterality: Right;   WISDOM TOOTH EXTRACTION      There were no vitals filed for this visit.   Subjective Assessment - 01/18/22 1411     Subjective I have good days and bad days. The R leg is still sore. Has been working on the exercises with her daughter at home.    Pertinent History right hip fx s/p THA and required revision, COPD, A-fib, CHF    Patient Stated Goals decrease right hip pain, improve strength/balance/walking    Currently in Pain? Yes    Pain Score 7     Pain Location  Generalized    Pain Descriptors / Indicators Sore    Pain Type Chronic pain                [facility] PT Assessment - 01/18/22 1414       Assessment   Medical Diagnosis ILD/Deconditioning    Referring Provider (PT) Brand Males, MD      AROM   Right Hip Flexion 75   in sitting   Right Knee Flexion 90   in sitting     Standardized Balance Assessment   Standardized Balance Assessment Five Times Sit to Stand    Five times sit to stand comments  62.63 sec   with heavy reliance on UEs and inability to stand fully     Timed Up and Go Test   Normal TUG (seconds) 61.32   with RW                          [facility] Adult PT Treatment/Exercise - 01/18/22 0001       Knee/Hip Exercises: Standing   Other Standing Knee Exercises sidestepping with green loop around ankles 2x   cues to decrease severity of R hip ER     Knee/Hip Exercises: Seated   Long Arc Quad Strengthening;2 sets;10 reps    Long Arc Quad Limitations green TB loop    Hamstring Curl Strengthening;Right;10 reps;2 sets    Hamstring Limitations green t-loop                     PT Education - 01/18/22 1454     Education Details discussion with patient and daughter on objective progress and recommendation to continue with HEP at home; advised that the patient can return with MD's referral if she experiences a setback    Person(s) Educated Patient;Child(ren)    Methods Explanation;Demonstration;Tactile cues;Verbal cues;Handout    Comprehension Verbalized understanding;Returned demonstration              PT Short Term Goals - 01/18/22 1458       PT SHORT TERM GOAL #1   Title Patient will be independent in HEP to improve functional outcomes    Time 4    Period Weeks    Status Achieved    Target Date 11/23/21      PT SHORT TERM GOAL #2   Title Demonstrate right hip flexion to 90 degrees and right knee flexion to 100 degrees to improve body mechanics for transfers    Baseline 70  degrees hip flexion, 92 right knee flexion--  no change; 12/11/2021-R hip flexion in sitting 85 degrees    Time 4    Period Weeks    Status Not Met   75 deg hip flexion, 90 deg knee flexion   Target Date 11/23/21      PT SHORT TERM GOAL #3   Title Manifest improved BLE strength per time of 30 sec 5xSTS test    Baseline 38.56" from elevated EOM; 59.44 sec 18" seat dependent on BUE support from arm rests; 54.84 sec 12/11/2021;    Time 4    Period Weeks    Status Not Met   56.12 sec   Target Date 11/23/21               PT Long Term Goals - 01/18/22 1500       PT LONG TERM GOAL #1   Title Demo reduced risk for falls per TUG test time 20 sec    Baseline 59.03 sec; 12/11/21: 75.90, 75.34 sec; 68 sec on 12/21/21    Time 4    Period Weeks    Status Not Met   61.32 sec   Target Date 01/18/22      PT LONG TERM GOAL #2   Title Manifest improved BLE strength per time 25 sec 5xSTS    Baseline 54.84 sec 12/11/21; 56.12 sec on 12/21/21    Time 4    Period Weeks    Status Not Met   56.12 sec   Target Date 01/18/22                   Plan - 01/18/22 1500     Clinical Impression Statement Patient arrived to session with report of compliance with HEP and unchanged pain levels. Balance assessments today including 5xSTS and TUG appear to have improved slightly since last assessment on 12/19/21. R hip and knee AROM grossly unchanged. Progress somewhat limited likely d/t patient's decreased endurance and comorbidities. Reviewed HEP with patient and provided handout- patient reported understanding. At this time patient is experiencing a plateau in progress and is ready for DC with transition to HEP. May consider resuming therapy if set back occurs.    Comorbidities right LE orthopedics, COPD, cardiac conditions    PT Treatment/Interventions ADLs/Self Care Home Management;Aquatic Therapy;Electrical Stimulation;DME Instruction;Gait training;Stair training;Functional mobility training;Therapeutic  activities;Therapeutic exercise;Balance training;Neuromuscular re-education;Manual techniques;Passive range of motion;Dry needling;Energy conservation;Joint Manipulations;Vestibular;Taping;Patient/family education    PT Next Visit Plan DC at this time    Consulted and Agree with Plan of Care Patient;Family member/caregiver    Family Member Consulted daughter             Patient will benefit from skilled therapeutic intervention in order to improve the following deficits and impairments:  Abnormal gait, Decreased activity tolerance, Decreased balance, Decreased mobility, Decreased range of motion, Decreased strength, Difficulty walking, Impaired flexibility, Improper body mechanics, Pain  Visit Diagnosis: Muscle weakness (generalized)  Unsteadiness on feet  Other abnormalities of gait and mobility  Difficulty in walking, not elsewhere classified     Problem List Patient Active Problem List   Diagnosis Date Noted   Palliative care encounter 07/27/2021   Constipation 07/27/2021   Cricopharyngeal achalasia 06/24/2021   Disorder of cervical esophageal region 06/23/2021   Pressure injury of skin 06/21/2021   Physical deconditioning 06/21/2021   SOB (shortness of breath) 06/18/2021   Acute on chronic respiratory failure with hypoxia (HCC) 06/18/2021   Chronic diastolic CHF (congestive heart failure) ([place]) 06/18/2021   Chronic anemia 06/18/2021   COVID-19 virus  infection 06/17/2021   Chronic pain of right knee 06/20/2020   Abnormality of gait 06/20/2020   Malnutrition of moderate degree 02/25/2020   S/P percutaneous endoscopic gastrostomy (PEG) tube placement (HCC)    Post-operative pain    Subtherapeutic international normalized ratio (INR)    Debility 02/17/2020   Mesenteric ischemia (HCC)    Sepsis ([place]) 02/08/2020   Small bowel ischemia (HCC) 02/08/2020   Thrombocytosis 02/08/2020   Abnormal CT of the abdomen    Periprosthetic fracture around internal prosthetic hip  joint 11/16/2019   Periprosthetic fracture around internal prosthetic hip joint, initial encounter    Chronic obstructive pulmonary disease (HCC)    Essential hypertension    Chronic anticoagulation    Acute blood loss anemia    Transaminitis    Hypoalbuminemia due to protein-calorie malnutrition (HCC)    Labile blood pressure    Drug induced constipation    Postoperative pain    Subcapital fracture of femur ([place]) 10/30/2019   Post-operative state    Prerenal azotemia    Leukocytosis    Closed right hip fracture, initial encounter ([place]) 10/27/2019   Hoarseness 02/19/2018   Sensorineural hearing loss (SNHL) of both ears 02/19/2018   Tinnitus, bilateral 02/19/2018   Cough 09/06/2017   Cough variant asthma 08/19/2017   Acute bronchitis 02/20/2016   Pulmonary air trapping 01/04/2016   Encounter for preoperative pulmonary examination 01/04/2016   NSIP (nonspecific interstitial pneumonia) ([place]) 09/26/2015   Chronic sinusitis 09/26/2015   ILD (interstitial lung disease) ([place] 07/12/2014   Upper airway cough syndrome 09/23/2013   Encounter for therapeutic drug monitoring 08/11/2013   Congestive heart failure ([place]) 07/08/2013   Coronary artery calcification seen on CAT scan 03/15/2013   Nodule of left lung 03/15/2013   Orthostatic lightheadedness 02/09/2013   Depression    Diverticulitis 01/01/2012   GERD (gastroesophageal reflux disease) 01/01/2012   Hypothyroid 11/23/2010   Edema 10/04/2010   Bronchiectasis ([place])    HYPERTENSION, BENIGN 10/22/2008   A-fib ([place])       PHYSICAL THERAPY DISCHARGE SUMMARY  Visits from Start of Care: 18  Current functional level related to goals / functional outcomes: See above clinical impression   Remaining deficits: R hip and knee ROM deficits, decreased/slowed mobility    Education / Equipment: HEP  Plan: Patient agrees to discharge.  Patient goals were not met. Patient is being discharged due to plateau in progress.        Janene Harvey, PT, DPT 01/18/22 3:06 PM   [facility] [address], [place], [place], [address] Phone: [phone]   Fax:  [phone]  Name: Stephanie Cortez MRN: [id number] Date of Birth: 12-23-1933

## 2022-01-23 ENCOUNTER — Ambulatory Visit: Payer: Medicare Other

## 2022-01-25 ENCOUNTER — Ambulatory Visit: Payer: Medicare Other

## 2022-01-30 ENCOUNTER — Ambulatory Visit: Payer: Medicare Other

## 2022-02-01 ENCOUNTER — Ambulatory Visit: Payer: Medicare Other

## 2022-02-06 ENCOUNTER — Ambulatory Visit: Payer: Medicare Other

## 2022-02-12 NOTE — Progress Notes (Signed)
Seems like encounter was open in error so closing encounter.  

## 2022-02-26 DIAGNOSIS — D508 Other iron deficiency anemias: Secondary | ICD-10-CM | POA: Diagnosis not present

## 2022-02-27 ENCOUNTER — Encounter: Payer: Self-pay | Admitting: Internal Medicine

## 2022-02-27 ENCOUNTER — Ambulatory Visit (INDEPENDENT_AMBULATORY_CARE_PROVIDER_SITE_OTHER): Payer: Medicare Other | Admitting: Internal Medicine

## 2022-02-27 VITALS — BP 120/66 | HR 98 | Ht 66.0 in | Wt 145.5 lb

## 2022-02-27 DIAGNOSIS — R1032 Left lower quadrant pain: Secondary | ICD-10-CM | POA: Diagnosis not present

## 2022-02-27 DIAGNOSIS — K5909 Other constipation: Secondary | ICD-10-CM | POA: Diagnosis not present

## 2022-02-27 DIAGNOSIS — K219 Gastro-esophageal reflux disease without esophagitis: Secondary | ICD-10-CM

## 2022-02-27 DIAGNOSIS — R0989 Other specified symptoms and signs involving the circulatory and respiratory systems: Secondary | ICD-10-CM | POA: Diagnosis not present

## 2022-02-27 MED ORDER — OMEPRAZOLE 40 MG PO CPDR: 40.0000 mg | DELAYED_RELEASE_CAPSULE | Freq: Two times a day (BID) | ORAL | 2 refills | Status: DC

## 2022-02-27 NOTE — Patient Instructions (Signed)
_______________________________________________________  If you are age 86 or older, your body mass index should be between 23-30. Your Body mass index is 23.48 kg/m. If this is out of the aforementioned range listed, please consider follow up with your Primary Care Provider.  If you are age 49 or younger, your body mass index should be between 19-25. Your Body mass index is 23.48 kg/m. If this is out of the aformentioned range listed, please consider follow up with your Primary Care Provider.   Continue Metamucil.  Start Miralax 17 grams daily.   The [facility] providers would like to encourage you to use [facility] to communicate with providers for non-urgent requests or questions.  Due to long hold times on the telephone, sending your provider a message by [facility] may be a faster and more efficient way to get a response.  Please allow 48 business hours for a response.  Please remember that this is for non-urgent requests.   It was a pleasure to see you today!  Thank you for trusting me with your gastrointestinal care!

## 2022-02-27 NOTE — Progress Notes (Signed)
Subjective:    Patient ID: Stephanie Cortez, female    DOB: September 30, 1933, 88 y.o.   MRN: [id number]  HPI Stephanie Cortez is an 88 year old female with a history of GERD, cricopharyngeal achalasia (previously treated with Botox), history of segmental colitis associated with diverticulosis previously treated with mesalamine but none of late, small bowel ischemia requiring resection in 2021, history of ILD, A-fib on Eliquis, hypertension history of COVID requiring hospitalization who is here for follow-up.  She is here today with her daughter and I last saw her in February of this year.  She reports that she is continuing to have issues with constipation with days of hard stools which are small.  Some days stools are more normal.  She is having issues with lower abdominal pain on though left and right lower abdomen though left more than right.  This eases up significantly when she is able to have a full bowel movement.  She has been using Metamucil on a daily basis and she finds this helpful.  No blood in stool or melena.  Omeprazole 40 mg twice daily has been definitively helpful.  There is certainly less regurgitation and she is not having heartburn.  She still has phlegm and throat clearing which can be a hindrance to her but she wonders if this is more related to her lungs.  She has made plans to try to avoid dairy or lactose and see if this helps with some of her overall symptoms.  She was started on oral iron ferrous gluconate 324 mg 1 or 2/day by primary care for mild anemia.  She had labs yesterday with [facility] primary care to follow-up CBC but has not been made aware of the result.   Review of Systems As per HPI, otherwise negative.   Current Medications, Allergies, Past Medical History, Past Surgical History, Family History and Social History were reviewed in [employer] record.     Objective:   Physical Exam BP 120/66   Pulse 98   Ht '5\' 6"'$  (1.676 m)   Wt 145 lb 8  oz (66 kg)   LMP  (LMP Unknown)   BMI 23.48 kg/m  Gen: awake, alert, NAD HEENT: anicteric  CV: irreg, irreg, 2/6 sem Pulm: CTA b/l Abd: soft, NT/ND, +BS throughout Ext: no c/c/e Neuro: nonfocal        Assessment & Plan:  88 year old female with a history of GERD, cricopharyngeal achalasia (previously treated with Botox), history of segmental colitis associated with diverticulosis previously treated with mesalamine but none of late, small bowel ischemia requiring resection in 2021, history of ILD, A-fib on Eliquis, hypertension history of COVID requiring hospitalization who is here for follow-up.   GERD/throat clearing/globus sensation and history of cricopharyngeal achalasia --symptoms definitively better with twice daily PPI.  Has not needed famotidine.  I recall that she benefited previously from Botox for her cricopharyngeal achalasia but at this point I do not recommend she pursue this unless symptoms worsen. -- Continue omeprazole 40 mg twice daily AC  2.  Constipation with both right and left lower quadrant abdominal pain --clinically this is not diverticulitis and her exam is benign.  I think her constipation is causing pain and colonic stretch.  Likely mild worsening with oral iron.  We discussed the following: -- Continue Metamucil daily -- Add MiraLAX 17 g/day -- If MiraLAX not effective we can try another laxative -- If pain continues and I recommend CT scan abdomen pelvis with contrast and I have asked  that she or her daughter call me if the abdominal pain fails to improve when we improve constipation  3.  Question of cirrhosis by imaging --likely hepatic congestion, no evidence of advanced liver disease or decompensated liver disease.  Supportive care unless overt complications thereof.  4.  Mild anemia --on oral iron and will have CBC follow-up with primary care.  Return to see me in November, sooner if needed  30 minutes total spent today including patient facing  time, coordination of care, reviewing medical history/procedures/pertinent radiology studies, and documentation of the encounter.

## 2022-03-03 NOTE — Progress Notes (Signed)
ILD progressive. Seeing her mid sept 2023. Will discuss then

## 2022-03-06 ENCOUNTER — Telehealth: Payer: Self-pay | Admitting: Family Medicine

## 2022-03-15 ENCOUNTER — Encounter: Payer: Self-pay | Admitting: Internal Medicine

## 2022-03-15 ENCOUNTER — Telehealth: Payer: Self-pay | Admitting: Internal Medicine

## 2022-03-15 ENCOUNTER — Ambulatory Visit (INDEPENDENT_AMBULATORY_CARE_PROVIDER_SITE_OTHER): Payer: Medicare Other | Admitting: Internal Medicine

## 2022-03-15 VITALS — BP 112/78 | HR 53 | Temp 97.9°F | Ht 66.0 in | Wt 143.8 lb

## 2022-03-15 DIAGNOSIS — J849 Interstitial pulmonary disease, unspecified: Secondary | ICD-10-CM

## 2022-03-15 DIAGNOSIS — R0989 Other specified symptoms and signs involving the circulatory and respiratory systems: Secondary | ICD-10-CM | POA: Diagnosis not present

## 2022-03-15 DIAGNOSIS — R1310 Dysphagia, unspecified: Secondary | ICD-10-CM | POA: Diagnosis not present

## 2022-03-15 DIAGNOSIS — J679 Hypersensitivity pneumonitis due to unspecified organic dust: Secondary | ICD-10-CM

## 2022-03-15 DIAGNOSIS — R058 Other specified cough: Secondary | ICD-10-CM

## 2022-03-15 MED ORDER — PREDNISONE 10 MG PO TABS: ORAL_TABLET | ORAL | 0 refills | Status: AC

## 2022-03-15 NOTE — Progress Notes (Signed)
OV 03/24/2015  Chief Complaint  Patient presents with   Follow-up    Pt here after PFT in 01/2015. Pt using nocturnal O2 and has noticed a difference the next day in her breathing. Pt states she has a prod cough with discolored mucus, PND, sinus pressure.    Ms. Angst is here for follow-up with her daughter [name], they are presenting for review of ILD workup. I'd always followed up with her baseline diagnosis of bronchiectasis but it started becoming more apparent that she actually has ILD. 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 According to the radiologist it has been stable since 2014 at least. Autoimmune workup showed elevated angiotensin-converting enzyme and CK but otherwise normal. Since the diagnosis of elevated CK her primary care physician has stopped her statin. There is no follow-up CK on this. We also notices that she had nocturnal desaturations restarted her on oxygen and sent her to pulmonary rehabilitation. These 2 measures haven't helped her immensely. She still continues a Symbicort for the prior diagnosis of bronchiectasis. She is inclined to continue that. She will have a flu shot today. She wants to know more about interstitial lung disease and had lot of questions about it  OV  08/11/2015: Acute office visit:  Patient presents to the office today with complaint of chest congestion and cough with yellow mucus intermittent wheezing and shortness of breath that has been ongoing for about 2-3 weeks, but per patient this has worsened over the last 2-3 days. Patient denies fever, chills, chest pain or hemoptysis. Patient endorses scant bloody mucus from her nose, that she feels this is probably related to her nocturnal oxygen use. She presented to her primary care physician [name] at [facility] and was treated initially with a Z-Pak. When Z-Pak did not resolve patient's symptoms she was then treated with clarithromycin 500 mg twice daily  starting 07/25/2015 for a 10 day treatment period . She is currently still taking this antibiotic. States since starting the clarithromycin that she has noticed some improvement.    OV 09/26/2015  Chief Complaint  Patient presents with   Follow-up    Pt here after seeing SG on 08/10/15 for an acute visit. Pt here after 27mt and PFT. Pt states she is still not feeling well. Pt c/o headache, hoarseness, wheezing, prod cough. Pt denies increase in SOB and f/c/s.    Routine office visit for this lady with ILD/NSIP pattern (previous diagnosis of bronchiectasis and on Symbicort]. Has associated sinus problems. Her daughter [name] not with her today.   88year old female presents for follow-up. She is frustrated by her chronic sinus drainage and chronic sinus tension  in the frontal area. This is been going on for many months a few years. She seen Dr. [name] ENT and apparently is planning a procedure. In the last several days to a few weeks she's having increasing headache particularly in the last 3 days. However there is no increase in sinus drainage volume or change in color of the sinus drainage [baseline color is much reading between white and yellow and green]  However in the last day or 2 she's increasing cough and wheezing despite taking Symbicort.  She is frustrated by her symptoms. She wants a definite answer currently same time she is reluctant to have surgical lung biopsy. She is also reluctant to have chronic daily prednisone because of side effects. She is also at the same time  frustrated about her extreme level of curiosity and need to search the Internet about her disease condition and then this works her into an anxiety      Spirometry today 09/26/2015 shows FVC 1.55 L/54%, FEV1 1.28 L/59% and a ratio of 83. This comes 6 weeks of her short prednisone burst. This a 300 mL decline in FVC since August 2016 but an improvement by 170 mL since June 2016   6 minute walk test today: Walk  336 m. Resting pulse ox was 98%. Peak exertion pulse ox was 94%. She had back pain but not shortness of breath.  Last CT scan of the chest was June 2016 which showed NSIP pattern  Last CT scan of the sinuses August 2013 that showed mild mucosal edema in the paranasal sinuses    OV 01/04/2016  Chief Complaint  Patient presents with   Follow-up    Breathing has been doing well, same as usual.  Discuss oxygen, has not worn oxygen x 2 weeks.  patient only uses at night, discuss testing to get off oxygen.  Having surgery on spine in August, wants to discuss. Wheezing.    FU NSIP with air trapping and cough/wheeze - on symbicort (prior dx in 2015)  - Routine fu. Had annual HRCT - stable ILD with air trapping x 2 years. Overall well. Does not want to use nocturnal o2. But having wheeze and cough that is stable but says symbicort is not helping. Also, reports saysShe is having spine surgery to the low spine in the next few months by Dr [name] and she wants know her preoperative pulmonary risk. She also wants to know the basis of her interstitial lung disease which we have discussed in the past but at this point in time her main issues symptoms. Walking desaturation test 185 feet 3 laps on room at in the office she did not desaturate   OV 02/20/2016  Chief Complaint  Patient presents with   Acute Visit    Pt c/o prod cough with yellow mucus with scant amount of blood, increase in SOB, wheezing. Pt states the spiriva makes her cough.    FU NSIP with air trapping and cough/wheeze - on symbicort (prior dx in 2015).  Last visit in July 2017 because of air trapping we took her off Symbicort and tried Spiriva and Brio she called on 02/17/2016 increasing cough. She was rotated back to Symbicort. She clearly attributes the worsening cough and symptoms to Spiriva. She feels that after going on Symbicort she is better. However she is having some yellow mucus and feels she is having acute bronchitis. She  has multiple drug allergies listed below. Regarding her interstitial lung disease this is been stable for 2 years. Last visit walking desaturation test did not desaturate. She then had overnight oxygen study 02/07/2016 and results are below and she does not need nocturnal oxygen. Therefore she will not need sleep study  OV 04/04/2017  Chief Complaint  Patient presents with   Follow-up    Pt states that she has been doing well. Breathing is at a stable point at this moment. Occ. cough and occ. SOB. Denies any CP.    Follow-up NSIP with a trapping and cough/wheeze and Symbicort responsive   83-year-ol overall stable. No new complaints. Not seen her in over a year. No interim health issues other than back pain for which she is avoiding back surgery. She is in need of a flu shot. She feels Symbicort is working well for  her and she wants to continue this. Without Symbicort she decompensated. Walking desaturation test 185 feet 3 laps on room air: Resting heart rate 63. Final 197. Resting pulse ox 100%. Final pulse ox 99%.   08/19/2017 acute extended ov/Wert re: acute cough in setting of flare of rhinitis? sinusitis Chief Complaint  Patient presents with   Acute Visit    wheezing,SOB with activity,productive cough with green, thick sputum, has started mucinex   nasal symptoms flared  X one week prior to OV with facial pressure/ nasal congestion  Then worse cough/ wheeze with mucus turning green, some epistaxis but no green nasal d/c Not normally needing any kind of rescue rx / does not even have one  symptoms Worse hs and off and on during  noct / props up on several pillows helps some Not limited by breathing from desired activities  But very sedentary  rec Omnicef 300 mg twice daily x 10 days Prednisone 10 mg take  4 each am x 2 days,   2 each am x 2 days,  1 each am x 2 days and stop  I emphasized that nasal steroids (flonase) ave no immediate benefit in terms of improving symptoms.  To help  them reached the target tissue, the patient should use Afrin two puffs every 12 hours  Work on perfecting inhaler technique:   - late add: should notify coumadin adviser re 10 day course of abx       08/27/2017  Acute extended  ov/Wert re: refractory cough/ wheeze Chief Complaint  Patient presents with   Acute Visit    cough and wheezing are unchanged since last visit. She is coughing up some green sputum- still taking omnicef.    Dyspnea:  No really sob Cough: less in volume, still green, some from nose/mostly from chest/ wakes her up each am / worse also when head hit pillow  Sleep: poorly / 45 degrees SABA use:  N/a Has flutter valve not suing   OV 11/20/2017    Chief Complaint  Patient presents with   Follow-up    Pt supposed to have PFT but was unable to show for it. Pt saw MW twice February 2019 due to a cough. Pt states her cough is now better. Has some mild SOB.    Follow-up NSIP with a trapping and cough/wheeze and Symbicort responsive    Follow-up interstitial lung disease associated with air trapping and Symbicort responsive.  She presents for follow-up.  Since I saw her last in October 2018 she presented 2 times in February 2019 acutely to this office and treated with Omnicef and prednisone and subsequently symptomatic treatment.  Followed up with nurse practitioner in March.  Pulmonary function test recommended for today's visit but because of delays in her previous appointment at the Coumadin clinic she could not get it done.  But at this point in time she feels her cough is resolved back to baseline she feels good.  She feels Symbicort is helping her a lot.  Walking desaturation test today is essentially close to normal other than mild desaturation to 3 points.  There are no new issues.   OV 03/06/2018  Subjective:  Patient ID: Jolene Provost, female , DOB: [birth year] , age 24 y.o. , MRN: [id number] , ADDRESS: [place] [id number]   03/06/2018 -    Chief Complaint  Patient presents with   Follow-up    Pt states she has been doing okay since last visit and is  mainly here today to discuss prognosis and wants more information abotu her diagnosis. Pt states she has not been sleeping well and states she still has the dry cough. Pt states SOB is stable.     HPI ZEPHYR SAUSEDO 88 y.o. -ast seen in May 2019. She was supposed to see her back in October 2019 with monitoring pulmonary function testing and high risk CT for her ILD not otherwise specified and associated possible cough variant asthma. Howevershe started thinking about this appointment and got extremely worried and anxious. She started looking at the Internet and she thought she might have IPF and she would die in the next few years.Therefore she is here to discuss this. Overall she's stable. She'll have a high dose flu shot. There are no new complaints. Back pain bothers her more This no worsening in dyspnea. There is no wheeze or cough     ROS - per HPI     OV 06/01/2019  Subjective:  Patient ID: Jolene Provost, female , DOB: 1933-12-06 , age 53 y.o. , MRN: [id number] , ADDRESS: [place] [address]   06/01/2019 -   Chief Complaint  Patient presents with   Follow-up    Pt states she has been doing okay since last visit. States she will become breathless faster than before.    Follow-up history of asthma and interstitial lung disease not otherwise specified  HPI Jolene Provost 88 y.o. -returns for follow-up.  Last seen September 2019.  At that point in time it was becoming more and more apparent that she had definite interstitial lung disease.  It was stable.  She was worried it might be IPF although the pattern did not fit.  Nevertheless she was stable.  I did recommend to her that she have a high-resolution CT chest and pulmonary function test as soon as possible and return in October 2019.  However she never did.  She does not have a satisfactory  answer as to why she did did not return.  It appears that was the end of 2019 her sister in [place] got ill and by 08-13-2018 she passed away.  Then COVID-19 pandemic here.  She says she has been social distancing at home.  Her daughter and visits with her.  She had a limited Thanksgiving with 6 people.  Everybody is doing well after Thanksgiving.  She has been masking as well.  Nevertheless she feels overall her shortness of breath is declined steadily in the last 1 year.  Symptom score is listed below.  In walking desaturation test she was only able to do 2 out of the 3 laps.  A year ago she was able to do all 3 laps.  Her pulse ox drop is worse than before.  She also has chronic cough and postnasal drainage and this is stable.  Most recent pulmonary function test and CT scan numbers are listed below.  Symptom score is listed below.       IMPRESSION: 1. Interstitial lung disease characterized by upper lobe predominant patchy peribronchovascular and subpleural reticulation and faint ground-glass attenuation with associated mild traction bronchiectasis. No frank honeycombing. Extensive air trapping. These findings have not appreciably changed and are most consistent with chronic hypersensitivity pneumonitis. These findings are not consistent with usual interstitial pneumonia (UIP). 2. Two vessel coronary atherosclerosis.  Stable mild cardiomegaly. 3. Aortic atherosclerosis.     Electronically Signed   By: Ilona Sorrel M.D.   On: 12/26/2015 15:44  Results for FANTASIA, JINKINS (MRN [id number]) as of 06/01/2019 10:58  Ref. Range 12/22/2014 12:33  Anti Nuclear Antibody (ANA) Latest Ref Range: NEGATIVE  NEG  Angiotensin-Converting Enzyme Latest Ref Range: 8 - 52 U/L 77 (H)  Cyclic Citrullin Peptide Ab Latest Ref Range: 0.0 - 5.0 U/mL <2.0  ds DNA Ab Latest Units: IU/mL <1  Myeloperoxidase Abs Latest Ref Range: <1.0 AI  <1.0  Serine Protease 3 Latest Ref Range: <1.0 AI  <1.0  RA Latex  Turbid. Latest Ref Range: <=14 IU/mL 13  Ribonucleic Protein(ENA) Antibody, IgG Latest Ref Range: <1.0 NEG AI  <1.0 NEG  SSA (Ro) (ENA) Antibody, IgG Latest Ref Range: <1.0 NEG AI  <1.0 NEG  SSB (La) (ENA) Antibody, IgG Latest Ref Range: <1.0 NEG AI  <1.0 NEG  Scleroderma (Scl-70) (ENA) Antibody, IgG Latest Ref Range: <1.0 NEG AI  <1.0 NEG     OV 09/03/2019  Subjective:  Patient ID: Jolene Provost, female , DOB: 23-Jun-1934 , age 12 y.o. , MRN: [id number] , ADDRESS: [place] [address]   09/03/2019 -   Chief Complaint  Patient presents with   Follow-up    Pt states she wears O2 at night which she states is helping. Pt will still become SOB with exertion.   Follow-up interstitial lung disease not otherwise specified. MDD discussion 08/11/2019: Very concerning for hypersensitive pneumonitis.  HPI LASHALA LASER 88 y.o. -last visit in our 2020 she reported worsening shortness of breath.  I was worried that her ILD was getting worse.  Therefore she did a high-resolution CT chest and it shows significant worsening especially with development of upper lobe groundglass opacities inflammatory findings with air trapping.  Very suggestive of hypersensitive pneumonitis.  She tells me that ever since moving from [place] to [facility] she is lived in the same home for 10 years.  In the year of COVID-19 in 2020 she has been mostly homebound.  There is mold in the basement and the basement is damp but she never goes there.  So she is puzzled about these findings.   [facility] Comprehensive ILD Questionnaire  Symptoms:  -Insidious onset of shortness of breath that is getting worse for the last several months.  Severity of dyspnea listed below.  She also has a cough from time to time.  The cough has been going on for years.  The cough is the same and ranges anywhere from mild to moderate.  She does cough at night some but not often.  She also brings up some phlegm but  not often.  The phlegm is clear to slightly yellow.  Never had hemoptysis does clear the throat.  Occasional wheezing present.  No nausea vomiting or diarrhea.     Past Medical History : She got diagnosis COPD but she has ILD.  Likewise she got diagnosed with asthma but she has ILD.  There is in the past she has had diastolic heart failure and she has atrial fibrillation on Coumadin.  She has thyroid disease for the last few decades.  No collagen vascular disease no lupus no polymyositis no Sjogren's.  No diabetes n   ROS: Positive for arthralgia and fatigue.  No dysphagia no dry eyes no Raynaud's no nausea no vomiting.  Does have acid reflux occasionally.   FAMILY HISTORY of LUNG DISEASE: Denies.  Denies pulmonary fibrosis in the family.   EXPOSURE HISTORY: No smoke exposure.  No cigarettes no vaping no cocaine no marijuana no IV  drug use no electronic cigarettes.   HOME and HOBBY DETAILS : She lives in a home that was built in the 85s.  She is lived in this home for 12 years [place] single-family.  The basement is damp and she thinks there is mold in it but she never goes there.  There is no mold or mildew in the shower curtain no mold or mildew in the bathroom does not use a humidifier.  No CPAP use no nebulizer use no steam iron use no misting Fountain use.  No pet birds or parakeets no pet gerbils.  She does not use any feather pillows or duvet.  She is not tested of any mold in the [facility] duct system so she does not know/does not play any wind instruments.  No guarding habits.   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  Denies completely   PULMONARY TOXICITY HISTORY (27 items): She took amiodarone for short.  At age 51 but otherwise negative.        Results for ALESHA, JAFFEE (MRN [id number]) as of 11/20/2017 11:40  Ref. Range 12/22/2014 10:11 03/02/2015 16:52 09/26/2015 10:04  FVC-Pre Latest Units: L 1.38 1.88 1.55  FVC-%Pred-Pre  Latest Units: % 47 65 54   Results for MONA, AYARS (MRN [id number]) as of 11/20/2017 11:40  Ref. Range 12/22/2014 10:11 03/02/2015 16:52  DLCO unc Latest Units: ml/min/mmHg 17.07 16.90  DLCO unc % pred Latest Units: % 63 62     IMPRESSION: 1. The appearance of the lungs is compatible with interstitial lung disease, with a spectrum of findings considered most compatible with an alternative diagnosis to usual interstitial pneumonia (UIP) per current ATS guidelines. Specifically, imaging findings are most suggestive of progressive chronic hypersensitivity pneumonitis. 2. Aortic atherosclerosis, in addition to left main and right coronary artery disease. 3. Dilatation of the pulmonic trunk (3.6 cm in diameter), concerning for pulmonary arterial hypertension. 4. Cardiomegaly with biatrial dilatation (right greater than left).   Aortic Atherosclerosis (ICD10-I70.0).     Electronically Signed   By: Vinnie Langton M.D.   On: 07/17/2019 15:19  ROS - per HPI   OV 10/20/2020  Subjective:  Patient ID: Jolene Provost, female , DOB: 11/27/1933 , age 50 y.o. , MRN: [id number] , ADDRESS: [address] [place] [address] PCP [name], MD Patient Care Team: [name], MD as PCP - General (Family Medicine) Deboraha Sprang, MD as PCP - Cardiology (Cardiology) Deboraha Sprang, MD (Cardiology) Sable Feil, MD (Gastroenterology) Brand Males, MD (Pulmonary Disease) Renato Shin, MD (Endocrinology) Erline Levine, MD (Neurosurgery) Weber Cooks, MD (Inactive) (Orthopedic Surgery) Rozetta Nunnery, MD (Otolaryngology)  This Provider for this visit: Treatment Team:  Attending Provider: Brand Males, MD  Follow-up interstitial lung disease of alternative to UIP pattern.  Stable for many years but between 2020 and 2021 progressive.  Concern for hypersensitive pneumonitis  Last CT chest January 2021 Last PFT March 2021  10/20/2020 -   Chief  Complaint  Patient presents with   Follow-up    Reports concern that breathing has gotten more shallow over past year. Still wants to get scheduled for bronchoscopy. Concerned about clear phlegm.      HPI KIMBERLLY NORGARD 88 y.o. -last seen in the spring 2021.  At that time because of progressive ILD and concern for hypersensitive pneumonitis we discussed s doing a bronchoscopy with lavage to help narrow the differential diagnosis.  However this  did not happen..  Initially patient was concerned but later by April 2021 she fractured her hip.  Since then she has not followed up.  Today she presents with her daughter and.  They both tell me that after the fracture she went to inpatient rehab.  And that she sustained a recurrent fracture and had to have another surgery in the month of May/June 2021.  Subsequently went home with PT.  After that in August 2021 also had bowel obstruction presumably due to opioids and had to have laparotomy.  Since then she has been doing home physical therapy and she is graduating from that.  Because of although she is lost a lot of weight and she is frail.  However she has never needed oxygen.  She not even using oxygen at night and is questioning whether she needs it 1 year ago she told me that actually helped her.  She continues with the Symbicort.  She does have crackles in the lung and intermittent squeaks which is baseline.  However she does not feel this.  She is back now because she wants to reestablish with myself at the ILD center.  She is interested in bronchoscopy.  She is also interested in getting better and stronger.  ILD symptom score and simple walking desaturation test is deferred at this visit   OV 12/19/2020  Subjective:  Patient ID: Jolene Provost, female , DOB: April 23, 1934 , age 47 y.o. , MRN: [id number] , ADDRESS: [address] [place] [address] PCP [name], MD Patient Care Team: [name], MD as PCP - General (Family  Medicine) Deboraha Sprang, MD as PCP - Cardiology (Cardiology) Deboraha Sprang, MD (Cardiology) Sable Feil, MD (Gastroenterology) Brand Males, MD (Pulmonary Disease) Renato Shin, MD (Endocrinology) Erline Levine, MD (Neurosurgery) Weber Cooks, MD (Inactive) (Orthopedic Surgery) Rozetta Nunnery, MD (Otolaryngology)  This Provider for this visit: Treatment Team:  Attending Provider: Brand Males, MD    12/19/2020 -   Chief Complaint  Patient presents with   Follow-up    PFT performed today. Pt states that her breathing feels worse since last visit.   Follow-up interstitial lung disease type/clinical hypersensitive pneumonitis progressive phenotype -ATS 2022 progressive pulmonary fibrosis [PPF]  HPI TIMOTHEA BODENHEIMER 88 y.o. -returns for follow-up.  Her daughter and accompanies her.  Since her last visit she is undergoing physical therapy and rehab she is gotten stronger has gained weight but still using a walker.  When we walked her today she only walked 1 lap dropped 8 points.  She could not walk any further.  She had pulmonary function test and high-resolution CT chest.  Both showed progressive pulmonary fibrosis.  She meets definition for this by ATS 2022 criteria.  We discussed this and the possibility that the etiology being hypersensitive pneumonitis.  We discussed that prednisone would be first-line treatment but given her frailty and hip fractures and age 23 we both took a shared decision making this not an ideal fit for her.  We next turned the conversation to nintedanib.  She is on Eliquis she has history of diverticulitis.  Explained high probably of diarrhea and the black box warnings of slight cardiac risk, bleeding risk and diverticulitis flareups.  Also fatigue.  She is not interested in this because of the side effects.  Explained the preventative nature.  At this point in time she prefers supportive care.  She wants to continue to get stronger and then  reassess nintedanib.  We ordered a  overnight oxygen study last time .  She and daughter report that it was done but I do not have the results.  They are willing to get it retested.  Meanwhile she is continue Symbicort because it helps her symptomatically.  PFT  OV 11/14/2021  Subjective:  Patient ID: Jolene Provost, female , DOB: October 08, 1933 , age 18 y.o. , MRN: [id number] , ADDRESS: [address] [place] [address] PCP [name], MD Patient Care Team: [name], MD as PCP - General (Family Medicine) Deboraha Sprang, MD as PCP - Cardiology (Cardiology) Deboraha Sprang, MD (Cardiology) Sable Feil, MD (Gastroenterology) Brand Males, MD (Pulmonary Disease) Renato Shin, MD (Endocrinology) Erline Levine, MD (Neurosurgery) Weber Cooks, MD (Inactive) (Orthopedic Surgery) Rozetta Nunnery, MD (Inactive) (Otolaryngology)  This Provider for this visit: Treatment Team:  Attending Provider: Brand Males, MD    11/14/2021 -   Chief Complaint  Patient presents with   Follow-up    Pt states she has been coughing up a lot of phlegm. States her cough is worse in the morning and also at night but also will cough throughout the day too.     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  HPI MODEAN MCCULLUM 88 y.o. -returns for follow-up.  Last seen in December 2022.  Then in December 2022 she had COVID-19 and hospitalized.  After that she has been physically deconditioned. She has now been ordered physical therapy and Occupational Therapy.  She has been getting this at our [facility] center.  She has not been on night oxygen she is not on day oxygen.  She lives alone daughter and lives nearby.  In fact daughter and is with her today.  She is able to do a lot of ADLs.  She needs some  assistance with bath.  She uses a walker.  She is continuing to get stronger.  We discussed ways that she could get stronger.  On also feel less fatigue.  We decided that we would retest her overnight pulse oximetry.  She is interested in this.  She is also reporting worsening sputum production for the last few months.  This is after the [place] admission.  Were not sure if the ILD itself is worse.  As of last year she had progressive phenotype.    Overall frailty continues though though she is stronger. Symptoms socre is actually worse   OV 03/15/2022  Subjective:  Patient ID: Jolene Provost, female , DOB: 1934/04/02 , age 27 y.o. , MRN: [id number] , ADDRESS: [address] [place] [address] PCP [name], MD Patient Care Team: [name], MD as PCP - General (Family Medicine) Deboraha Sprang, MD as PCP - Cardiology (Cardiology) Deboraha Sprang, MD (Cardiology) Sable Feil, MD (Gastroenterology) Brand Males, MD (Pulmonary Disease) Renato Shin, MD (Inactive) (Endocrinology) Erline Levine, MD (Neurosurgery) Weber Cooks, MD (Inactive) (Orthopedic Surgery) Rozetta Nunnery, MD (Inactive) (Otolaryngology)  This Provider for this visit: Treatment Team:  Attending Provider: Brand Males, MD  Follow-up interstitial lung disease type/clinical hypersensitive pneumonitis progressive phenotype  -ATS 2022 progressive pulmonary fibrosis [PPF] MDD discussion 08/11/2019: Very concerning for hypersensitive pneumonitis.  03/15/2022 -   Chief Complaint  Patient presents with   Follow-up    Pt states she has been coughing a lot since last visit and states she will cough up phlegm that is yellow in color. Pt also  has had some problems with her breathing. Also has had some wheezing.     HPI CANIYAH MURLEY 88 y.o. -returns for follow-up with her daughter [name].  Her grandson Ovid Curd is going to get married in October 2024.  She is pretty excited about that.   Other than that this visit is to review progression in her ILD.  Her overnight pulse oximetry did not show any desaturations.  She continues to be on room air at night.  She had high-resolution CT chest that compared to the previous 1 shows continued progression consistent with hypersensitive pneumonitis.  This only mild progression.  For this have recommended no antifibrotic's and no need for night O2 but just supportive care.  During this visit they report a lot of choking episodes happened 6 days ago with water and thyroid medication and 3 days ago with solid.  She is known to have cricopharyngeal achalasia and a remote history of Botox injection possibly in [place].  She recently saw Dr. Hilarie Fredrickson but that was before these choking episodes.  In the hospital last year she did have a speech study and did have dysphagia.  She is also reporting clearing of the throat for the last 2 or 3 years worse in the last 6 to 12 months.  She brought up some white phlegm.  She says also occasionally it is yellow in color but really there is no change in the phlegm or the sputum quantity or quality nevertheless they are open to the idea of trying prednisone.  Review of the records indicate that she did see ENT in 2019 Dr. Wilburn Cornelia but it was for hearing issues.  They are really worried about the clearing of throat and choking episodes and dysphagia.  She is interested in the ILD-Pro registry study.  She did the consent last time and is interested in participating if she qualifies.   She continues with the Symbicort.  SYMPTOM SCALE - ILD 06/01/2019  09/03/2019   10/20/2020 Lost lot of weight. Uses walker. Frail.  12/19/2020 ra 11/14/2021  03/15/2022   O2 use RA Night o2 O Not using nigh 02  Ono - nl  Shortness of Breath 0 -> 5 scale with 5 being worst (score 6 If unable to do)       At rest 0.25 0  0 1 1  Simple tasks - showers, clothes change, eating, shaving 1 0  0 2 2.5  Household (dishes, doing bed, laundry) 1 0  0  2 2  Shopping 2 1.5  x x 2.5  Walking level at own pace 1 0  '1 2 1  '$ Walking up Stairs 2.'5 3  3 3 2  '$ Total (40 - 48) Dyspnea Score 7.75 4.'5  4 10 10  '$ How bad is your cough? slight 1.5  1 0 5  How bad is your fatigue mld 1.5  0 2 3  nause  0  0 0 0  vomit  0  0 2.5 00  darrhea  0  0 0 0  anxiety  1  0 0 0  depressio  0  0 x 0      Simple office walk 185 feet x  3 laps goal with forehead probe 11/20/2017  06/01/2019  09/03/2019  12/19/2020   O2 used Room air RA ra ra  Number laps completed 3 laps 3 laps -was goal. Stopped at 2 laps due to severe dyspnea 3 laps goal but only walked1.5 laps  3 lap goal.  Did only 1 lap  Comments about pace Slow pace Slow pace avg pace Slow with walker  Resting Pulse Ox/HR 100% and 58/min 99% and 70.min 100% and 78/min 99% and 110  Final Pulse Ox/HR 97% and 88/min 93% and 110/min 92% and 104/min 91% and 125  Desaturated </= 88% no no no   Desaturated <= 3% points yes Yes, 6 points Yes, 8 points Yyes 8 points  Got Tachycardic >/= 90/min no yes yes   Symptoms at end of test Mild dyspnea and back pain Moderate dyspnea and stopped prematurely Severe dyspnea, stopped prematureley Mild dyspnea  Miscellaneous comments Mild antalgic gait Back and calf pain too     CT Chest data  No results found.  Narrative & Impression  CLINICAL DATA:  88 year old female with history of wheezing and shortness of breath. Evaluate for interstitial lung disease.   EXAM: CT CHEST WITHOUT CONTRAST   TECHNIQUE: Multidetector CT imaging of the chest was performed following the standard protocol without intravenous contrast. High resolution imaging of the lungs, as well as inspiratory and expiratory imaging, was performed.   RADIATION DOSE REDUCTION: This exam was performed according to the departmental dose-optimization program which includes automated exposure control, adjustment of the mA and/or kV according to patient size and/or use of iterative reconstruction  technique.   COMPARISON:  High-resolution chest CT 11/24/2020.   FINDINGS: Cardiovascular: Heart size is enlarged with biatrial dilatation (right-greater-than-left). There is no significant pericardial fluid, thickening or pericardial calcification. There is aortic atherosclerosis, as well as atherosclerosis of the great vessels of the mediastinum and the coronary arteries, including calcified atherosclerotic plaque in the left main, left circumflex and right coronary arteries. Dilatation of the pulmonic trunk (3.5 cm in diameter).   Mediastinum/Nodes: Multiple prominent borderline enlarged mediastinal and bilateral hilar lymph nodes, similar to the prior study. Esophagus is unremarkable in appearance. No axillary lymphadenopathy.   Lungs/Pleura: High-resolution images again demonstrate widespread but patchy areas of ground-glass attenuation, septal thickening, scattered subpleural reticulation, extensive thickening of the peribronchovascular interstitium and some scattered areas of mild cylindrical bronchiectasis and peripheral bronchiolectasis. No frank honeycombing. These findings do appear mildly progressive compared to the prior study. There is no definitive craniocaudal gradient, with the majority of the most severe fibrotic findings in the mid to upper lungs. Inspiratory and expiratory imaging demonstrates moderate air trapping indicative of small airways disease. No acute consolidative airspace disease. No pleural effusions. No definite suspicious appearing pulmonary nodules or masses are noted.   Upper Abdomen: Calcified granulomas in the spleen. Aortic atherosclerosis.   Musculoskeletal: There are no aggressive appearing lytic or blastic lesions noted in the visualized portions of the skeleton.   IMPRESSION: 1. Chronic interstitial lung disease slightly progressive compared to the prior examination, most compatible with an alternative diagnosis (not usual  interstitial pneumonia), once again favored to represent chronic hypersensitivity pneumonitis. 2. Dilatation of the pulmonic trunk (3.5 cm in diameter), concerning for pulmonary arterial hypertension. 3. Aortic atherosclerosis, in addition to left main and 2 vessel coronary artery disease.   Aortic Atherosclerosis (ICD10-I70.0).     Electronically Signed   By: Vinnie Langton M.D.   On: 12/14/2021 08:20      PFT     Latest Ref Rng & Units 12/19/2020    1:47 PM 09/23/2019    1:08 PM 09/26/2015   10:04 AM 03/02/2015    4:52 PM 12/22/2014   10:11 AM  PFT Results  FVC-Pre L 1.08  1.30  1.55  1.88  1.38  FVC-Predicted Pre % 40  48  54  65  47   FVC-Post L  1.31   1.82  1.53   FVC-Predicted Post %  48   63  53   Pre FEV1/FVC % % 88  86  83  79  86   Post FEV1/FCV % %  87   84  88   FEV1-Pre L 0.95  1.13  1.28  1.48  1.19   FEV1-Predicted Pre % 48  56  59  68  55   FEV1-Post L  1.15   1.54  1.35   DLCO uncorrected ml/min/mmHg 7.79  9.24   16.90  17.07   DLCO UNC% % 39  46   62  63   DLCO corrected ml/min/mmHg 7.79  9.24      DLCO COR %Predicted % 39  46      DLVA Predicted % 99  101   103  111   TLC L    3.84  4.04   TLC % Predicted %    71  75   RV % Predicted %    83  89        has a past medical history of Anemia, Anxiety, Atrial fibrillation (HCC), Bronchiectasis, CHF (congestive heart failure) (HCC), COPD (chronic obstructive pulmonary disease) (HCC), Cricopharyngeal achalasia, Depression, Diverticulosis, Dyslipidemia, Eczema, Fatty liver, GERD with stricture, Glaucoma, H. 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   reports that she has never smoked. She has never used smokeless tobacco.  Past Surgical History:  Procedure Laterality Date   BOWEL RESECTION N/A 02/08/2020   Procedure: SMALL BOWEL RESECTION;   Surgeon: Donnie Mesa, MD;  Location: [facility];  Service: General;  Laterality: N/A;   BREAST SURGERY     br bx   CARDIAC CATHETERIZATION  07/01/2013   CATARACT EXTRACTION     both   COLONOSCOPY     ESOPHAGOSCOPY W/ BOTOX INJECTION  12/11/2011   Procedure: ESOPHAGOSCOPY WITH BOTOX INJECTION;  Surgeon: Rozetta Nunnery, MD;  Location: [facility];  Service: ENT;  Laterality: N/A;  esophogoscopy with dilation, botox injection   FOOT SURGERY  03/14/2011   gastroc slide-rt   GASTROSTOMY N/A 02/08/2020   Procedure: INSERTION OF GASTROSTOMY TUBE;  Surgeon: Donnie Mesa, MD;  Location: [facility];  Service: General;  Laterality: N/A;   HIP ARTHROPLASTY Right 10/28/2019   Procedure: ARTHROPLASTY BIPOLAR HIP (HEMIARTHROPLASTY);  Surgeon: Marybelle Killings, MD;  Location: [facility];  Service: Orthopedics;  Laterality: Right;   LAPAROTOMY N/A 02/08/2020   Procedure: EXPLORATORY LAPAROTOMY;  Surgeon: Donnie Mesa, MD;  Location: [facility];  Service: General;  Laterality: N/A;   LYSIS OF ADHESION N/A 02/08/2020   Procedure: LYSIS OF ADHESIONS;  Surgeon: Donnie Mesa, MD;  Location: [facility];  Service: General;  Laterality: N/A;   TOTAL ABDOMINAL HYSTERECTOMY     TOTAL HIP REVISION Right 11/16/2019   Procedure: TOTAL HIP REVISION BIPOLAR TO CEMENTED BIPOLAR;  Surgeon: Marybelle Killings, MD;  Location: [facility];  Service: Orthopedics;  Laterality: Right;   WISDOM TOOTH EXTRACTION      Allergies  Allergen Reactions   Doxycycline Other (See Comments)    Rash on face and neck   Hydrocodone Other (See Comments)    Other reaction(s): hallucinations   Vicodin [Hydrocodone-Acetaminophen] Other (See Comments)    Hallucinations    Flagyl [Metronidazole] Nausea  And Vomiting   Moxifloxacin Other (See Comments)     Weakness/fatigue   Pantoprazole Sodium Rash   Penicillins Rash    Has patient had a PCN reaction causing immediate rash, facial/tongue/throat swelling, SOB or lightheadedness with hypotension:unsure Has  patient had a PCN reaction causing severe rash involving mucus membranes or skin necrosis:unsure Has patient had a PCN reaction that required hospitalization:No Has patient had a PCN reaction occurring within the last 10 years:Yes Cannot recall exact reaction due to time lapse If all of the above answers are "NO", then may proceed with Cephalosporin use.  Other reaction(s): Unknown    Immunization History  Administered Date(s) Administered   Influenza Split 04/06/2011, 04/03/2012   Influenza Whole 06/01/2009, 04/03/2010   Influenza, High Dose Seasonal PF 04/04/2017, 03/06/2018, 03/03/2019   Influenza,inj,Quad PF,6+ Mos 03/10/2013, 03/24/2015   Influenza-Unspecified 04/01/2014   Pneumococcal Conjugate-13 07/28/2014   Pneumococcal Polysaccharide-23 07/04/2006, 06/13/2016   Tdap 07/28/2014    Family History  Problem Relation Age of Onset   Hypertension Mother    Stroke Mother    Emphysema Brother    Other Father        miner's lung   Cancer Father        Lung   Colon polyps Sister    Pancreatic cancer Sister    Kidney disease Sister    Atrial fibrillation Other        siblings     Current Outpatient Medications:    acetaminophen (TYLENOL) 325 MG tablet, Take 2 tablets (650 mg total) by mouth every 6 (six) hours as needed for mild pain (or Fever >/= 101)., Disp: , Rfl:    apixaban (ELIQUIS) 2.5 MG TABS tablet, TAKE ONE TABLET BY MOUTH TWICE DAILY., Disp: 180 tablet, Rfl: 0   FLUoxetine (PROZAC) 20 MG capsule, Take 20 mg by mouth daily., Disp: , Rfl:    furosemide (LASIX) 20 MG tablet, Take 20 mg by mouth daily., Disp: , Rfl:    levothyroxine (SYNTHROID) 50 MCG tablet, Take 1 tablet (50 mcg total) by mouth daily., Disp: 90 tablet, Rfl: 3   omeprazole (PRILOSEC) 40 MG capsule, Take 1 capsule (40 mg total) by mouth 2 (two) times daily before a meal., Disp: 90 capsule, Rfl: 2   Polyethylene Glycol 3350 (MIRALAX PO), Take by mouth., Disp: , Rfl:    potassium chloride (KLOR-CON) 8  MEQ tablet, Take 16 mEq by mouth daily., Disp: , Rfl:    pravastatin (PRAVACHOL) 40 MG tablet, Take 1 tablet (40 mg total) by mouth at bedtime., Disp: 30 tablet, Rfl: 0   predniSONE (DELTASONE) 10 MG tablet, Take 4 tablets (40 mg total) by mouth daily with breakfast for 2 days, THEN 2 tablets (20 mg total) daily with breakfast for 2 days, THEN 1 tablet (10 mg total) daily with breakfast for 2 days, THEN 0.5 tablets (5 mg total) daily with breakfast for 2 days., Disp: 15 tablet, Rfl: 0   psyllium (METAMUCIL) 58.6 % packet, Take 1 packet by mouth daily as needed (constipation)., Disp: , Rfl:    SYMBICORT 80-4.5 MCG/ACT inhaler, INHALE 2 PUFFS INTO THE LUNGS TWICE DAILY (Patient taking differently: Inhale 2 puffs into the lungs in the morning and at bedtime.), Disp: 10.2 g, Rfl: 3   traZODone (DESYREL) 50 MG tablet, Take 50 mg by mouth at bedtime as needed., Disp: , Rfl:       Objective:   Vitals:   03/15/22 1146  BP: 112/78  Pulse: (!) 53  Temp: 97.9 F (  36.6 C)  TempSrc: Oral  SpO2: 94%  Weight: 143 lb 12.8 oz (65.2 kg)  Height: '5\' 6"'$  (1.676 m)    Estimated body mass index is 23.21 kg/m as calculated from the following:   Height as of this encounter: '5\' 6"'$  (1.676 m).   Weight as of this encounter: 143 lb 12.8 oz (65.2 kg).  '@WEIGHTCHANGE'$ @  [employer]   03/15/22 1146  Weight: 143 lb 12.8 oz (65.2 kg)     Physical Exam  General: No distress. Looks stable. frail.  Sitting in the wheelchair Neuro: Alert and Oriented x 3. GCS 15. Speech normal Psych: Pleasant Resp:  Barrel Chest - no.  Wheeze -usual upper lobe crackles/wheeze. No overt respiratory distress CVS: Normal heart sounds. Murmurs - no Ext: Stigmata of Connective Tissue Disease - no HEENT: Normal upper airway. PEERL +. No post nasal drip        Assessment:       ICD-10-CM   1. ILD (interstitial lung disease) ([place])  J84.9     2. Hypersensitivity pneumonitis ([place])  J67.9     3. Cough productive of clear  sputum  R05.8     4. Dysphagia, unspecified type  R13.10 Ambulatory referral to ENT    5. Throat clearing  R09.89 Ambulatory referral to ENT         Plan:     Patient Instructions  ILD (interstitial lung disease) ([place]) Hypersensitivity pneumonitis (HCC) Cough productive of clear sputum  - progressive but still not at point needing o2 - unclear if cough is due to lungs or throast  Plan  - do not recommend anti-fibrotics due to side effect prrofile - Take prednisone 40 mg daily x 2 days, then '20mg'$  daily x 2 days, then '10mg'$  daily x 2 days, then '5mg'$  daily x 2 days and stop  - call in 1 week to let us know response - do ILD PRO visit 03/15/2022      Dysphagia, unspecified type Throat clearing  - refer ENT Dr Wilburn Cornelia (last seen in 2019) - will message Dr Hilarie Fredrickson  Follow-up - Return to see Dr. Chase Caller in 12 weeks to report progress  - symptom score at followu'   - 30 min visit  ( Level 05 visit: Estb 40-54 min  in  visit type: on-site physical face to visit  in total care time and counseling or/and coordination of care by this undersigned MD - Dr Brand Males. This includes one or more of the following on this same day 03/15/2022: pre-charting, chart review, note writing, documentation discussion of test results, diagnostic or treatment recommendations, prognosis, risks and benefits of management options, instructions, education, compliance or risk-factor reduction. It excludes time spent by the [place] or office staff in the care of the patient. Actual time 89 min)   SIGNATURE    Dr. Brand Males, M.D., F.C.C.P,  Pulmonary and Critical Care Medicine Staff Physician, [place] Director - Interstitial Lung Disease  Program  Pulmonary [place] at [place], [place], [address]  Pager: [phone], If no answer or between  15:00h - 7:00h: call [phone] Telephone: [phone]  1:24 PM 03/15/2022

## 2022-03-15 NOTE — Telephone Encounter (Signed)
Left detailed message for pts daughter on her voicemail. Will try pt again tomorrow, got no answer on her number.

## 2022-03-15 NOTE — Telephone Encounter (Signed)
Stephanie Cortez - you saw her recently but after that 2 major choking episodes at home.  6 days ago with thyroid medication and then 3 days ago with solid.  The daughter reports that the ulnar-sided to Heimlich.  Just passing it on because I know she has a history of cricopharyngeal achalasia with a remote history of Botox which she decided to keep with the last reserve option  Thanks    SIGNATURE    Dr. Brand Males, M.D., F.C.C.P,  Pulmonary and Critical Care Medicine Staff Physician, [place] Director - Interstitial Lung Disease  Program  Medical Director - [facility] [place] at [place], [place], [address]  NPI Number:  NPI #[id number] [place] Number: [id number]  Pager: [phone], If no answer  -[place] or Try [phone] Telephone (clinical office): [phone] Telephone (research): [phone]  1:29 PM 03/15/2022

## 2022-03-15 NOTE — Patient Instructions (Addendum)
ILD (interstitial lung disease) (HCC) Hypersensitivity pneumonitis (HCC) Cough productive of clear sputum  - progressive but still not at point needing o2 - unclear if cough is due to lungs or throast  Plan  - do not recommend anti-fibrotics due to side effect prrofile - Take prednisone 40 mg daily x 2 days, then 20mg daily x 2 days, then 10mg daily x 2 days, then 5mg daily x 2 days and stop  - call in 1 week to let us know response - do ILD PRO visit 03/15/2022      Dysphagia, unspecified type Throat clearing  - refer ENT Dr Shoemaker (last seen in 2019) - will message Dr Pyrtle  Follow-up - Return to see Dr. Azaliyah Kennard in 12 weeks to report progress  - symptom score at followu'   - 30 min visit  

## 2022-03-16 NOTE — Telephone Encounter (Signed)
Left message for pt to call back  °

## 2022-03-20 NOTE — Telephone Encounter (Signed)
Pt aware and detailed message left on daughters voicemail.

## 2022-04-05 ENCOUNTER — Encounter: Payer: Self-pay | Admitting: Internal Medicine

## 2022-04-05 ENCOUNTER — Ambulatory Visit: Payer: Self-pay | Admitting: Licensed Clinical Social Worker

## 2022-04-05 NOTE — Telephone Encounter (Signed)
MR, please advise on pt message.

## 2022-04-06 NOTE — Telephone Encounter (Signed)
Repeated prednisone. Or prolonged pred can be risky for her given her frailty. Best discusseda t next ov

## 2022-04-06 NOTE — Patient Outreach (Signed)
  Care Coordination   04/06/2022 Name: Stephanie Cortez MRN: [id number] DOB: 1934/05/19   Care Coordination Outreach Attempts:  An unsuccessful telephone outreach was attempted today to offer the patient information about available care coordination services as a benefit of their health plan.   Follow Up Plan:  Additional outreach attempts will be made to offer the patient care coordination information and services.   Encounter Outcome:  No Answer  Care Coordination Interventions Activated:  No   Care Coordination Interventions:  No, not indicated    Lenor Derrick , MSW Social Worker [facility] Care Management  [phone]

## 2022-04-08 ENCOUNTER — Other Ambulatory Visit: Payer: Self-pay | Admitting: Internal Medicine

## 2022-04-15 ENCOUNTER — Other Ambulatory Visit: Payer: Self-pay | Admitting: Internal Medicine

## 2022-04-15 DIAGNOSIS — I4811 Longstanding persistent atrial fibrillation: Secondary | ICD-10-CM

## 2022-04-16 ENCOUNTER — Ambulatory Visit (INDEPENDENT_AMBULATORY_CARE_PROVIDER_SITE_OTHER): Payer: Medicare Other | Admitting: Family Medicine

## 2022-04-16 ENCOUNTER — Encounter: Payer: Self-pay | Admitting: Family Medicine

## 2022-04-16 VITALS — BP 122/74 | HR 80 | Temp 98.0°F | Ht 66.0 in | Wt 147.2 lb

## 2022-04-16 DIAGNOSIS — I5032 Chronic diastolic (congestive) heart failure: Secondary | ICD-10-CM

## 2022-04-16 DIAGNOSIS — Z79899 Other long term (current) drug therapy: Secondary | ICD-10-CM

## 2022-04-16 DIAGNOSIS — D509 Iron deficiency anemia, unspecified: Secondary | ICD-10-CM

## 2022-04-16 DIAGNOSIS — I10 Essential (primary) hypertension: Secondary | ICD-10-CM | POA: Diagnosis not present

## 2022-04-16 DIAGNOSIS — E039 Hypothyroidism, unspecified: Secondary | ICD-10-CM | POA: Diagnosis not present

## 2022-04-16 DIAGNOSIS — I4811 Longstanding persistent atrial fibrillation: Secondary | ICD-10-CM

## 2022-04-16 DIAGNOSIS — E785 Hyperlipidemia, unspecified: Secondary | ICD-10-CM

## 2022-04-16 DIAGNOSIS — J849 Interstitial pulmonary disease, unspecified: Secondary | ICD-10-CM | POA: Diagnosis not present

## 2022-04-16 MED ORDER — PRAVASTATIN SODIUM 40 MG PO TABS: 40.0000 mg | ORAL_TABLET | Freq: Every day | ORAL | 3 refills | Status: DC

## 2022-04-16 MED ORDER — POTASSIUM CHLORIDE ER 8 MEQ PO TBCR: 16.0000 meq | EXTENDED_RELEASE_TABLET | Freq: Every day | ORAL | 3 refills | Status: DC

## 2022-04-16 MED ORDER — TRAZODONE HCL 50 MG PO TABS: 50.0000 mg | ORAL_TABLET | Freq: Every evening | ORAL | 3 refills | Status: DC | PRN

## 2022-04-16 MED ORDER — FUROSEMIDE 20 MG PO TABS: 20.0000 mg | ORAL_TABLET | Freq: Every day | ORAL | 3 refills | Status: DC

## 2022-04-16 MED ORDER — FLUOXETINE HCL 20 MG PO CAPS: 20.0000 mg | ORAL_CAPSULE | Freq: Every day | ORAL | 3 refills | Status: DC

## 2022-04-16 MED ORDER — LEVOTHYROXINE SODIUM 50 MCG PO TABS
50.0000 ug | ORAL_TABLET | Freq: Every day | ORAL | 3 refills | Status: DC
Start: 2022-04-16 — End: 2023-01-01

## 2022-04-16 NOTE — Progress Notes (Signed)
Phone: [phone]   Subjective:  Patient presents today to establish care.  Prior patient of Dr. [name] with [facility] .  Chief Complaint  Patient presents with   Annual Exam    Wants to discuss being able to drive and walk without walker. Also low iron and Dr. Hilarie Fredrickson is aware.   cellulitis    Pt c/o bilateral cellulitis on legs worse on right leg.    See problem oriented charting  The following were reviewed and entered/updated in epic: Past Medical History:  Diagnosis Date   Anemia    - Hgb 9.7gm% on 07/13/2008 in [facility] 129gm% wiht normal irone levsl and ferritin 10/27/2008 in [place] Recurrent otitis/sinusitis   Anxiety    chronic BZ prn   Atrial fibrillation (HCC)    chronic anticoag   Bronchiectasis    >PFT 07/13/2008 in [facility] 1.9L/76%, FVC 2.45L/74, Ratio 79, TLC 121%, DLCO 64%  AE BRonchiectasis - Dec 2010.New Rx:  outpatient - Feb 2011 - Rx outpatient   CHF (congestive heart failure) ([place])    COPD (chronic obstructive pulmonary disease) (HCC)    bronchiectasis   Cricopharyngeal achalasia    Depression    Diverticulosis    Dyslipidemia    Eczema    Fatty liver    GERD with stricture    Glaucoma    H. 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[place])    Ventral hernia    Vertigo    Wears glasses    Patient Active Problem List   Diagnosis Date Noted   Acute on chronic respiratory failure with hypoxia ([place]) 06/18/2021    Priority: High   Chronic obstructive pulmonary disease ([place])     Priority: High   ILD (interstitial lung disease) ([place]) 07/12/2014    Priority: High   Congestive heart failure ([place]) 07/08/2013    Priority: High   A-fib ([place])     Priority: High   Hyperlipidemia, unspecified 04/16/2022    Priority: Medium    Constipation 07/27/2021    Priority: Medium     Essential hypertension     Priority: Medium    Cough variant asthma 08/19/2017    Priority: Medium    Upper airway cough syndrome 09/23/2013    Priority: Medium    Coronary artery calcification seen on CAT scan 03/15/2013    Priority: Medium    GERD (gastroesophageal reflux disease) 01/01/2012    Priority: Medium    Hypothyroid 11/23/2010    Priority: Medium    Periprosthetic fracture around internal prosthetic hip joint 11/16/2019    Priority: 1.   Periprosthetic fracture around internal prosthetic hip joint, initial encounter     Priority: 1.   Subcapital fracture of femur ([place]) 10/30/2019    Priority: 1.   Cricopharyngeal achalasia 06/24/2021   Disorder of cervical esophageal region 06/23/2021   Chronic diastolic CHF (congestive heart failure) ([place]) 06/18/2021   Chronic anemia 06/18/2021   COVID-19 virus infection 06/17/2021   Chronic pain of right knee 06/20/2020   Malnutrition of moderate degree 02/25/2020   S/P percutaneous endoscopic gastrostomy (PEG) tube placement ([place])    Debility 02/17/2020   Mesenteric ischemia (HCC)    Small bowel ischemia ([place]) 02/08/2020   Chronic anticoagulation    Acute blood  loss anemia    Leukocytosis    Closed right hip fracture, initial encounter ([place]) 10/27/2019   Hoarseness 02/19/2018   Sensorineural hearing loss (SNHL) of both ears 02/19/2018   Tinnitus, bilateral 02/19/2018   Cough 09/06/2017   Acute bronchitis 02/20/2016   Encounter for preoperative pulmonary examination 01/04/2016   NSIP (nonspecific interstitial pneumonia) ([place]) 09/26/2015   Chronic sinusitis 09/26/2015   Nodule of left lung 03/15/2013   Orthostatic lightheadedness 02/09/2013   Depression    Diverticulitis 01/01/2012   Edema 10/04/2010   Bronchiectasis ([place])    Past Surgical History:  Procedure Laterality Date   BOWEL RESECTION N/A 02/08/2020   Procedure: SMALL BOWEL RESECTION;  Surgeon: Donnie Mesa, MD;  Location: [facility];  Service: General;  Laterality:  N/A;   BREAST SURGERY     br bx   CARDIAC CATHETERIZATION  07/01/2013   CATARACT EXTRACTION     both   COLONOSCOPY     ESOPHAGOSCOPY W/ BOTOX INJECTION  12/11/2011   Procedure: ESOPHAGOSCOPY WITH BOTOX INJECTION;  Surgeon: Rozetta Nunnery, MD;  Location: [facility];  Service: ENT;  Laterality: N/A;  esophogoscopy with dilation, botox injection   FOOT SURGERY  03/14/2011   gastroc slide-rt   GASTROSTOMY N/A 02/08/2020   Procedure: INSERTION OF GASTROSTOMY TUBE;  Surgeon: Donnie Mesa, MD;  Location: [facility];  Service: General;  Laterality: N/A;   HIP ARTHROPLASTY Right 10/28/2019   Procedure: ARTHROPLASTY BIPOLAR HIP (HEMIARTHROPLASTY);  Surgeon: Marybelle Killings, MD;  Location: [facility];  Service: Orthopedics;  Laterality: Right;   LAPAROTOMY N/A 02/08/2020   Procedure: EXPLORATORY LAPAROTOMY;  Surgeon: Donnie Mesa, MD;  Location: [facility];  Service: General;  Laterality: N/A;   LYSIS OF ADHESION N/A 02/08/2020   Procedure: LYSIS OF ADHESIONS;  Surgeon: Donnie Mesa, MD;  Location: [facility];  Service: General;  Laterality: N/A;   TOTAL ABDOMINAL HYSTERECTOMY     TOTAL HIP REVISION Right 11/16/2019   Procedure: TOTAL HIP REVISION BIPOLAR TO CEMENTED BIPOLAR;  Surgeon: Marybelle Killings, MD;  Location: [facility];  Service: Orthopedics;  Laterality: Right;   WISDOM TOOTH EXTRACTION      Family History  Problem Relation Age of Onset   Hypertension Mother    Stroke Mother    Emphysema Brother    Other Father        miner's lung   Cancer Father        Lung   Colon polyps Sister    Pancreatic cancer Sister    Kidney disease Sister    Atrial fibrillation Other        siblings    Medications- reviewed and updated Current Outpatient Medications  Medication Sig Dispense Refill   acetaminophen (TYLENOL) 325 MG tablet Take 2 tablets (650 mg total) by mouth every 6 (six) hours as needed for mild pain (or Fever >/= 101).     apixaban (ELIQUIS) 2.5 MG TABS tablet Take 1 tablet (2.5 mg total)  by mouth 2 (two) times daily. Needs cardiology appt for refills, call office 60 tablet 0   omeprazole (PRILOSEC) 40 MG capsule Take 1 capsule (40 mg total) by mouth 2 (two) times daily before a meal. 90 capsule 2   Polyethylene Glycol 3350 (MIRALAX PO) Take by mouth.     psyllium (METAMUCIL) 58.6 % packet Take 1 packet by mouth daily as needed (constipation).     SYMBICORT 80-4.5 MCG/ACT inhaler INHALE 2 PUFFS INTO THE LUNGS TWICE DAILY 10.2 g 3   FLUoxetine (  PROZAC) 20 MG capsule Take 1 capsule (20 mg total) by mouth daily. 90 capsule 3   furosemide (LASIX) 20 MG tablet Take 1 tablet (20 mg total) by mouth daily. 90 tablet 3   levothyroxine (SYNTHROID) 50 MCG tablet Take 1 tablet (50 mcg total) by mouth daily. 90 tablet 3   potassium chloride (KLOR-CON) 8 MEQ tablet Take 2 tablets (16 mEq total) by mouth daily. 180 tablet 3   pravastatin (PRAVACHOL) 40 MG tablet Take 1 tablet (40 mg total) by mouth at bedtime. 90 tablet 3   traZODone (DESYREL) 50 MG tablet Take 1 tablet (50 mg total) by mouth at bedtime as needed. 90 tablet 3   No current facility-administered medications for this visit.    Allergies-reviewed and updated Allergies  Allergen Reactions   Doxycycline Other (See Comments)    Rash on face and neck   Hydrocodone Other (See Comments)    Other reaction(s): hallucinations   Tramadol     Constipation, abdominal obstruction leading to weakness after using this   Vicodin [Hydrocodone-Acetaminophen] Other (See Comments)    Hallucinations    Flagyl [Metronidazole] Nausea And Vomiting   Moxifloxacin Other (See Comments)     Weakness/fatigue   Pantoprazole Sodium Rash   Penicillins Rash    Has patient had a PCN reaction causing immediate rash, facial/tongue/throat swelling, SOB or lightheadedness with hypotension:unsure Has patient had a PCN reaction causing severe rash involving mucus membranes or skin necrosis:unsure Has patient had a PCN reaction that required  hospitalization:No Has patient had a PCN reaction occurring within the last 10 years:Yes Cannot recall exact reaction due to time lapse If all of the above answers are "NO", then may proceed with Cephalosporin use.  Other reaction(s): Unknown    Social History   Social History Narrative   2 children 4 grandchildren   Recently moved to [place] from [place]   Retired Pharmacist, hospital   Grew up in [place]. Moved to [place]. Moved to [place] Feb 2010 due to duaghther living in [place]. Son lives in [place], [place]. Husband works as Scientist, physiological in [place].    Objective  Objective:  BP 122/74   Pulse 80   Temp 98 F (36.7 C)   Ht '5\' 6"'$  (1.676 m)   Wt 147 lb 3.2 oz (66.8 kg)   LMP  (LMP Unknown)   SpO2 95%   BMI 23.76 kg/m  Gen: NAD, resting comfortably HEENT: Mucous membranes are moist. Oropharynx normal. TM normal on right, appears to have some scarring on the left Eyes: sclera and lids normal, PERRLA CV: Irregularly irregular Lungs: Diffuse wheeze noted  Ext: Trace edema-venous stasis skin changes reach further on right lower leg compared to left lower leg-no warmth, increased tenderness, increased edema on the right lower leg at present Skin: warm, dry    Assessment and Plan:   #Advanced care directives- plans to discuss with family. Wants to live as long as brain allows to converse.    #MSK/mobility concerns:  foot surgery right foot- did not go well- has dealt with pain for years- tripped and hurt arm in 2020. Has also dealth with right hip pain - needs help with 1 step to get into shower -walker and picker upper as part of walker  #Dental work- had bridge that came out and has had major dental work and ongoing at this point -a lot to deal with for her  %ILD- followed by DR. Ramaswamy- remains on symbicort  -not on antifibrotics -has  continued to have  throat clearing issues for several years- already on high dose PPI. Did better on prednisone and  antibiotic for tooth issues. Pending ENT visit.  - also complains of ear pain- on exam I wonder if she has a healed over previously ruptured TM on left but normal exam on right . has desire to place finger in left or right  ear (left >R)- due to a hurting deeper in ear- seems to help some. Year of issues. With upcoming ENT visit she plans to discuss- no clear cause on exam today  % GI issues- cricopharyngeal achalasia (previously on botox), bistory of segmental colitis associated with diverticulosis (previously on mesalamine), history of small bowel ischemia requiring resection in 2021  -also sees for constipation (metamucil, miralax)- has cut down on dairy and helps -mention of cirrhosis by imaging but thought to be hepatic congestoin - patient extremely grateful for Dr. Hilarie Fredrickson through these trials  # GERD- Follows with Dr. Hilarie Fredrickson S:Medication:  omeprazole 40 mg BID A/P: Reports reasonable control-with ongoing throat clearing issues hopefully ENT is able to help-patient also has return visit with GI planned later this year  #mild anemia- on oral iron - Dr. Hilarie Fredrickson recommended PCP follow up  -Apparently ferritin levels were trending up-we will update CBC as well as iron panel  # Atrial fibrillation S: Rate controlled with no medication Anticoagulated with eliquis 2.5 mg BID A/P: Appropriately anticoagulated.  Currently rate controlled without medication  #Heart failure with preserved ejection fraction S: Medication: lasix 20 mg daily  Edema: Stable Weight gain: Largely stable in the 140s Shortness of breath: No reported worsening A/P: Seems to be stable on current medication-we opted to continue   # Depression S: Medication: fluoxetine 20 mg, trazodone 50 mg.  Reports reasonable control and certainly excellent sleep A/P: Doing well-patient/family want to continue current medication  #hypertension S: medication: furosemide 20 mg alone - in the past benicar, bystolic, diltiazem all at  once in 2021- dealt with orthostatic symptoms and weakness A/P: Doing well on furosemide alone-continue current medication  #hyperlipidemia S: Medication: pravastatin 40 mg A/P: Update lipids-likely to continue current medication unless very substantially high readings considering her age.  Does have coronary artery calcification on CT so ideally LDL at least under 100 if not under 70  #hypothyroidism S: compliant On thyroid medication- levothyroxine 50 mcg A/P: hopefully stable- update TSH today. Continue current meds for now    #Suspected venous insufficiency-bilateral lower leg redness worse on the right leg compared to left but history of more significant issues on the right including prior foot reconstruction and typically orthopedic concerns on that side    Recommended follow up: Return in about 1 month (around 05/17/2022) for followup or sooner if needed.Schedule b4 you leave. Future Appointments  Date Time Provider [place]  05/18/2022  3:20 PM Marin Olp, MD [facility]  06/19/2022  1:00 PM Brand Males, MD [facility] None    Meds ordered this encounter  Medications   FLUoxetine (PROZAC) 20 MG capsule    Sig: Take 1 capsule (20 mg total) by mouth daily.    Dispense:  90 capsule    Refill:  3   furosemide (LASIX) 20 MG tablet    Sig: Take 1 tablet (20 mg total) by mouth daily.    Dispense:  90 tablet    Refill:  3   levothyroxine (SYNTHROID) 50 MCG tablet    Sig: Take 1 tablet (50 mcg total) by mouth daily.  Dispense:  90 tablet    Refill:  3   potassium chloride (KLOR-CON) 8 MEQ tablet    Sig: Take 2 tablets (16 mEq total) by mouth daily.    Dispense:  180 tablet    Refill:  3   pravastatin (PRAVACHOL) 40 MG tablet    Sig: Take 1 tablet (40 mg total) by mouth at bedtime.    Dispense:  90 tablet    Refill:  3   traZODone (DESYREL) 50 MG tablet    Sig: Take 1 tablet (50 mg total) by mouth at bedtime as needed.    Dispense:  90 tablet     Refill:  3    Time Spent: 65 minutes of total time (3:33 PM- 4:33 PM, 9:29 PM-9:34 PM) was spent on the date of the encounter performing the following actions: chart review prior to seeing the patient as well as directly reviewing with patient and daughter and updating epic, obtaining history, performing a medically necessary exam, counseling on the treatment plan, placing orders, and documenting in our EHR.  We were not able to fully review each detail together and we opted to schedule follow-up in a month to continue to review her problem list together-reviewed and categorize what we are able to today and I did review data points listed above in note-but still plan to go back and confirm with patient.  Return precautions advised. Garret Reddish, MD

## 2022-04-16 NOTE — Telephone Encounter (Signed)
Prescription refill request for Eliquis received. Indication:Afib Last office visit:needs appt Scr:0.6 Age: 88 Weight:65.2 kg  Prescription refilled

## 2022-04-16 NOTE — Patient Instructions (Addendum)
Schedule follow up with Dr. Caryl Comes  Let us know if you had flu shot. Let us know shingrix dates.   Please stop by lab before you go If you have mychart- we will send your results within 3 business days of [place] receiving them.  If you do not have mychart- we will call you about results within 5 business days of [place] receiving them.  *please also note that you will see labs on mychart as soon as they post. I will later go in and write notes on them- will say "notes from Dr. Yong Channel"   Recommended follow up: Return in about 1 month (around 05/17/2022) for followup or sooner if needed.Schedule b4 you leave.

## 2022-04-17 ENCOUNTER — Encounter: Payer: Self-pay | Admitting: Internal Medicine

## 2022-04-17 LAB — CBC WITH DIFFERENTIAL/PLATELET
Basophils Absolute: 0.1 10*3/uL (ref 0.0–0.1)
Basophils Relative: 0.9 % (ref 0.0–3.0)
Eosinophils Absolute: 0.2 10*3/uL (ref 0.0–0.7)
Eosinophils Relative: 3.5 % (ref 0.0–5.0)
HCT: 33.4 % — ABNORMAL LOW (ref 36.0–46.0)
Hemoglobin: 10.6 g/dL — ABNORMAL LOW (ref 12.0–15.0)
Lymphocytes Relative: 18.9 % (ref 12.0–46.0)
Lymphs Abs: 1.3 10*3/uL (ref 0.7–4.0)
MCHC: 31.8 g/dL (ref 30.0–36.0)
MCV: 86.2 fl (ref 78.0–100.0)
Monocytes Absolute: 0.6 10*3/uL (ref 0.1–1.0)
Monocytes Relative: 9.5 % (ref 3.0–12.0)
Neutro Abs: 4.5 10*3/uL (ref 1.4–7.7)
Neutrophils Relative %: 67.2 % (ref 43.0–77.0)
Platelets: 396 10*3/uL (ref 150.0–400.0)
RBC: 3.87 Mil/uL (ref 3.87–5.11)
RDW: 19.1 % — ABNORMAL HIGH (ref 11.5–15.5)
WBC: 6.6 10*3/uL (ref 4.0–10.5)

## 2022-04-17 LAB — IBC + FERRITIN
Ferritin: 25.6 ng/mL (ref 10.0–291.0)
Iron: 32 ug/dL — ABNORMAL LOW (ref 42–145)
Saturation Ratios: 7.5 % — ABNORMAL LOW (ref 20.0–50.0)
TIBC: 428.4 ug/dL (ref 250.0–450.0)
Transferrin: 306 mg/dL (ref 212.0–360.0)

## 2022-04-17 LAB — COMPREHENSIVE METABOLIC PANEL
ALT: 12 U/L (ref 0–35)
AST: 21 U/L (ref 0–37)
Albumin: 3.9 g/dL (ref 3.5–5.2)
Alkaline Phosphatase: 124 U/L — ABNORMAL HIGH (ref 39–117)
BUN: 10 mg/dL (ref 6–23)
CO2: 29 mEq/L (ref 19–32)
Calcium: 9.4 mg/dL (ref 8.4–10.5)
Chloride: 97 mEq/L (ref 96–112)
Creatinine, Ser: 0.55 mg/dL (ref 0.40–1.20)
GFR: 82.83 mL/min (ref >60.00)
Glucose, Bld: 92 mg/dL (ref 70–99)
Potassium: 4.4 mEq/L (ref 3.5–5.1)
Sodium: 132 mEq/L — ABNORMAL LOW (ref 135–145)
Total Bilirubin: 0.7 mg/dL (ref 0.2–1.2)
Total Protein: 7.2 g/dL (ref 6.0–8.3)

## 2022-04-17 LAB — LIPID PANEL
Cholesterol: 130 mg/dL (ref 0–200)
HDL: 56 mg/dL (ref >39.00)
LDL Cholesterol: 63 mg/dL (ref 0–99)
NonHDL: 73.58
Total CHOL/HDL Ratio: 2
Triglycerides: 51 mg/dL (ref 0.0–149.0)
VLDL: 10.2 mg/dL (ref 0.0–40.0)

## 2022-04-17 LAB — TSH: TSH: 2.21 u[IU]/mL (ref 0.35–5.50)

## 2022-04-17 LAB — VITAMIN B12: Vitamin B-12: 309 pg/mL (ref 211–911)

## 2022-04-19 NOTE — Telephone Encounter (Signed)
Noted and will let Schae of [facility] know

## 2022-04-19 NOTE — Telephone Encounter (Signed)
MR, please see pt's latest message about not wanting to participate in survey/blood work.

## 2022-04-25 ENCOUNTER — Ambulatory Visit: Payer: Medicare Other | Admitting: Internal Medicine

## 2022-04-28 ENCOUNTER — Encounter: Payer: Self-pay | Admitting: Family Medicine

## 2022-04-30 ENCOUNTER — Telehealth (INDEPENDENT_AMBULATORY_CARE_PROVIDER_SITE_OTHER): Payer: Medicare Other | Admitting: Family Medicine

## 2022-04-30 ENCOUNTER — Encounter: Payer: Self-pay | Admitting: Family Medicine

## 2022-04-30 VITALS — HR 96

## 2022-04-30 DIAGNOSIS — J849 Interstitial pulmonary disease, unspecified: Secondary | ICD-10-CM | POA: Diagnosis not present

## 2022-04-30 DIAGNOSIS — U071 COVID-19: Secondary | ICD-10-CM | POA: Diagnosis not present

## 2022-04-30 DIAGNOSIS — I5032 Chronic diastolic (congestive) heart failure: Secondary | ICD-10-CM

## 2022-04-30 DIAGNOSIS — I4811 Longstanding persistent atrial fibrillation: Secondary | ICD-10-CM

## 2022-04-30 MED ORDER — MOLNUPIRAVIR EUA 200MG CAPSULE: 4.0000 | ORAL_CAPSULE | Freq: Two times a day (BID) | ORAL | 0 refills | Status: AC

## 2022-04-30 NOTE — Progress Notes (Signed)
Phone [phone] Virtual visit via Video note   Subjective:  Chief complaint: Chief Complaint  Patient presents with   Covid Positive    Pt tested positive for COVID today, dry cough daughter wants to know should pt be admitted (see mychart message).   This visit type was conducted due to national recommendations for restrictions regarding the COVID-19 Pandemic (e.g. social distancing).  This format is felt to be most appropriate for this patient at this time balancing risks to patient and risks to population by having him in for in person visit.  No physical exam was performed (except for noted visual exam or audio findings with Telehealth visits).    Our team/I connected with Jolene Provost at  4:00 PM EDT by a video enabled telemedicine application (doxy.me or caregility through epic) and verified that I am speaking with the correct person using two identifiers.  Location patient: Home-[facility] Location provider: [facility], office Persons participating in the virtual visit:  patient  Our team/I discussed the limitations of evaluation and management by telemedicine and the availability of in person appointments. In light of current covid-19 pandemic, patient also understands that we are trying to protect them by minimizing in office contact if at all possible.  The patient expressed consent for telemedicine visit and agreed to proceed. Patient understands insurance will be billed.   Past Medical History-  Patient Active Problem List   Diagnosis Date Noted   ILD (interstitial lung disease) ([place]) 07/12/2014    Priority: High   Heart failure with preserved ejection fraction ([place]) 07/08/2013    Priority: High   A-fib ([place])     Priority: High   Hyperlipidemia, unspecified 04/16/2022    Priority: Medium    Constipation 07/27/2021    Priority: Medium    Essential hypertension     Priority: Medium    Upper airway cough syndrome 09/23/2013    Priority: Medium    Coronary artery  calcification seen on CAT scan 03/15/2013    Priority: Medium    GERD (gastroesophageal reflux disease) 01/01/2012    Priority: Medium    Hypothyroid 11/23/2010    Priority: Medium    Periprosthetic fracture around internal prosthetic hip joint 11/16/2019    Priority: 1.   Periprosthetic fracture around internal prosthetic hip joint, initial encounter     Priority: 1.   Subcapital fracture of femur ([place]) 10/30/2019    Priority: 1.   Cricopharyngeal achalasia 06/24/2021   Disorder of cervical esophageal region 06/23/2021   Chronic anemia 06/18/2021   COVID-19 virus infection 06/17/2021   Chronic pain of right knee 06/20/2020   S/P percutaneous endoscopic gastrostomy (PEG) tube placement ([place])    Debility 02/17/2020   Mesenteric ischemia (HCC)    Small bowel ischemia ([place]) 02/08/2020   Chronic anticoagulation    Acute blood loss anemia    Leukocytosis    Closed right hip fracture, initial encounter ([place]) 10/27/2019   Hoarseness 02/19/2018   Sensorineural hearing loss (SNHL) of both ears 02/19/2018   Tinnitus, bilateral 02/19/2018   Cough 09/06/2017   Acute bronchitis 02/20/2016   Encounter for preoperative pulmonary examination 01/04/2016   NSIP (nonspecific interstitial pneumonia) ([place]) 09/26/2015   Chronic sinusitis 09/26/2015   Nodule of left lung 03/15/2013   Orthostatic lightheadedness 02/09/2013   Depression    Diverticulitis 01/01/2012   Edema 10/04/2010   Bronchiectasis ([place])     Medications- reviewed and updated Current Outpatient Medications  Medication Sig Dispense Refill   acetaminophen (TYLENOL) 325 MG tablet  Take 2 tablets (650 mg total) by mouth every 6 (six) hours as needed for mild pain (or Fever >/= 101).     apixaban (ELIQUIS) 2.5 MG TABS tablet Take 1 tablet (2.5 mg total) by mouth 2 (two) times daily. Needs cardiology appt for refills, call office 60 tablet 0   FLUoxetine (PROZAC) 20 MG capsule Take 1 capsule (20 mg total) by mouth daily. 90 capsule 3    furosemide (LASIX) 20 MG tablet Take 1 tablet (20 mg total) by mouth daily. 90 tablet 3   levothyroxine (SYNTHROID) 50 MCG tablet Take 1 tablet (50 mcg total) by mouth daily. 90 tablet 3   molnupiravir EUA (LAGEVRIO) 200 mg CAPS capsule Take 4 capsules (800 mg total) by mouth 2 (two) times daily for 5 days. 40 capsule 0   omeprazole (PRILOSEC) 40 MG capsule Take 1 capsule (40 mg total) by mouth 2 (two) times daily before a meal. 90 capsule 2   Polyethylene Glycol 3350 (MIRALAX PO) Take by mouth.     potassium chloride (KLOR-CON) 8 MEQ tablet Take 2 tablets (16 mEq total) by mouth daily. 180 tablet 3   pravastatin (PRAVACHOL) 40 MG tablet Take 1 tablet (40 mg total) by mouth at bedtime. 90 tablet 3   psyllium (METAMUCIL) 58.6 % packet Take 1 packet by mouth daily as needed (constipation).     SYMBICORT 80-4.5 MCG/ACT inhaler INHALE 2 PUFFS INTO THE LUNGS TWICE DAILY 10.2 g 3   traZODone (DESYREL) 50 MG tablet Take 1 tablet (50 mg total) by mouth at bedtime as needed. 90 tablet 3   No current facility-administered medications for this visit.     Objective:  Pulse 96   LMP  (LMP Unknown)   SpO2 93%  self reported vitals Gen: NAD, resting comfortably Lungs: nonlabored, normal respiratory rate  Skin: appears dry, no obvious rash     Assessment and Plan   # Covid 19 S: On 04/24/22 had a tooth pulled and some discomfort. Friday morning woke up concerned about sore and scratchy throat. Lower appetite but dental issues likely contributed. Had very low interest this morning in eating/drinking/taking meds but by 2: 30 pm she was up getting ready and had something to eat/drink. Also dealing with dry cough and some headache. Was feeling some labored breathing earlier today but improving as day goes on. No BM in 2 days- did take some miralax and metamucil as Dr. Hilarie Fredrickson had suggested. Some nausea and had one throwing up episode of tea this am  Not wheezing much yet more than normal. Able to sleep  through the night  A/P: Patient with testing confirming covid 19 with first day of covid 19 symptoms 04/27/22. Tested positive for covid today at home.  Vaccination status: no vaccinations for covid. Did have Covid 06/29/21- but felt felt sicker Immunization History  Administered Date(s) Administered   Influenza Split 04/06/2011, 04/03/2012   Influenza Whole 06/01/2009, 04/03/2010   Influenza, High Dose Seasonal PF 04/04/2017, 03/06/2018, 03/03/2019   Influenza,inj,Quad PF,6+ Mos 03/10/2013, 03/24/2015   Influenza-Unspecified 04/01/2014   Pneumococcal Conjugate-13 07/28/2014   Pneumococcal Polysaccharide-23 07/04/2006, 06/13/2016   Tdap 07/28/2014    Therefore: - recommended patient watch closely for shortness of breath or confusion or worsening symptoms and if those occur patient should contact us immediately or seek care in the emergency department -recommended patient consider purchasing pulse oximeter and if levels 94% (in her case with baseline around 94 to 95% we opted to use slightly lower cutoff of 90%)  or below persistently- seek care at the hospital - Patient needs to self isolate  for at least 5 days since first symptom AND at least 24 hours fever free without fever reducing medications AND have improvement in respiratory symptoms . After 5 days can end self isolation but still needs to wear mask for additional 5 days .  -Patient should inform close contacts about exposure (anyone patient been around unmasked for more than 15 minutes)   If High risk for complications-we discussed outpatient therapeutic options including paxlovid , molnupiravir - patient opted  for molnupiravir (paxlovid not an option due to eliquis) -due to poor PO hold lasix short term   %ILD- followed by DR. Ramaswamy- remains on symbicort (he notes not COPD or asthma)  -not on antifibrotics -consider brief 5-8 day course of prednisone if not making improvement in 2-3 days or so- I will plan to reach out to  her around that time  # Atrial fibrillation S: Rate controlled with no rx Anticoagulated with eliquis 2.5 mg BID A/P: Appropriately anticoagulated and rate controlled-no option for paxlovid due to being on Eliquis-we will use molnupiravir  #Heart failure with preserved ejection fraction S: Medication: lasix 20 mg daily  Edema: None Weight gain: None reported Shortness of breath: Largely stable with baseline A/P: Due to poor p.o. holding Lasix short-term for a day or 2 until p.o. improves-encouraged her to stay hydrated.  Can restart Lasix if has any edema  Recommended follow up: As needed for new or worsening or persistent symptoms Future Appointments  Date Time Provider [place]  05/18/2022  3:20 PM Marin Olp, MD [facility]  06/08/2022  3:45 PM Deboraha Sprang, MD [facility]  06/19/2022  1:00 PM Brand Males, MD [facility] None    Lab/Order associations:   ICD-10-CM   1. COVID-19  U07.1     2. ILD (interstitial lung disease) ([place])  J84.9     3. Chronic heart failure with preserved ejection fraction (HCC)  I50.32     4. Longstanding persistent atrial fibrillation (HCC)  I48.11       Meds ordered this encounter  Medications   molnupiravir EUA (LAGEVRIO) 200 mg CAPS capsule    Sig: Take 4 capsules (800 mg total) by mouth 2 (two) times daily for 5 days.    Dispense:  40 capsule    Refill:  0   Return precautions advised.  Garret Reddish, MD

## 2022-04-30 NOTE — Telephone Encounter (Signed)
We discussed concerns at time of visit.  Patient had improved substantially by time daughter visited in the afternoon

## 2022-05-04 ENCOUNTER — Encounter: Payer: Self-pay | Admitting: Family Medicine

## 2022-05-08 DIAGNOSIS — K219 Gastro-esophageal reflux disease without esophagitis: Secondary | ICD-10-CM | POA: Diagnosis not present

## 2022-05-08 DIAGNOSIS — K224 Dyskinesia of esophagus: Secondary | ICD-10-CM | POA: Diagnosis not present

## 2022-05-08 DIAGNOSIS — L299 Pruritus, unspecified: Secondary | ICD-10-CM | POA: Diagnosis not present

## 2022-05-16 ENCOUNTER — Other Ambulatory Visit: Payer: Self-pay | Admitting: Internal Medicine

## 2022-05-16 DIAGNOSIS — I4811 Longstanding persistent atrial fibrillation: Secondary | ICD-10-CM

## 2022-05-16 NOTE — Telephone Encounter (Signed)
Prescription refill request for Eliquis received. Indication: AF Last office visit: 02/10/21  Olin Pia MD (has appt scheduled for 12/23) Scr: 0.55 on 04/16/22 Age: 88 Weight: 69.9kg  Pt currently taking Eliquis 2.'5mg'$  twice daily.  Due to age, borderline weight and poor health will continue current dose.

## 2022-05-18 ENCOUNTER — Encounter: Payer: Self-pay | Admitting: Family Medicine

## 2022-05-18 ENCOUNTER — Ambulatory Visit: Payer: Medicare Other | Admitting: Family Medicine

## 2022-05-22 NOTE — Telephone Encounter (Signed)
LVM to schedule ov

## 2022-05-29 ENCOUNTER — Encounter: Payer: Self-pay | Admitting: Family Medicine

## 2022-06-05 ENCOUNTER — Encounter: Payer: Self-pay | Admitting: Internal Medicine

## 2022-06-08 ENCOUNTER — Encounter: Payer: Self-pay | Admitting: Internal Medicine

## 2022-06-08 ENCOUNTER — Ambulatory Visit: Payer: Commercial Managed Care - PPO | Attending: Internal Medicine | Admitting: Internal Medicine

## 2022-06-08 VITALS — BP 135/82 | HR 99 | Ht 66.0 in | Wt 142.8 lb

## 2022-06-08 DIAGNOSIS — I4821 Permanent atrial fibrillation: Secondary | ICD-10-CM | POA: Diagnosis not present

## 2022-06-08 DIAGNOSIS — I5032 Chronic diastolic (congestive) heart failure: Secondary | ICD-10-CM

## 2022-06-08 MED ORDER — FUROSEMIDE 20 MG PO TABS: 20.0000 mg | ORAL_TABLET | ORAL | 1 refills | Status: DC | PRN

## 2022-06-08 MED ORDER — POTASSIUM CHLORIDE ER 8 MEQ PO TBCR: 16.0000 meq | EXTENDED_RELEASE_TABLET | ORAL | 0 refills | Status: DC | PRN

## 2022-06-08 NOTE — Patient Instructions (Signed)
Medication Instructions:  Your physician has recommended you make the following change in your medication:   ** Take Furosemide '20mg'$  - 1 tablet by mouth as needed and [place] 22mq as needed  *If you need a refill on your cardiac medications before your next appointment, please call your pharmacy*   Lab Work: None ordered.  If you have labs (blood work) drawn today and your tests are completely normal, you will receive your results only by: [place]if you have [place]Chart) OR A paper copy in the mail If you have any lab test that is abnormal or we need to change your treatment, we will call you to review the results.   Testing/Procedures: None ordered.    Follow-Up: [facility] and your [facility] needs are our [facility].  As part of our continuing mission to provide you with exceptional heart care, we have created designated Provider Care Teams.  These Care Teams include your primary Cardiologist (physician) and Advanced Practice Providers (APPs -  Physician Assistants and Nurse Practitioners) who all work together to provide you with the care you need, when you need it.  We recommend signing up for the patient portal called "MyChart".  Sign up information is provided on this After Visit Summary.  MyChart is used to connect with patients for Virtual Visits (Telemedicine).  Patients are able to view lab/test results, encounter notes, upcoming appointments, etc.  Non-urgent messages can be sent to your provider as well.   To learn more about what you can do with MyChart, go to [email or web address]    Your next appointment:   6 months with Dr KOlin PiaPA  Important Information About Sugar

## 2022-06-08 NOTE — Progress Notes (Signed)
Patient Care Team: Marin Olp, MD as PCP - General (Family Medicine) Deboraha Sprang, MD as PCP - Cardiology (Cardiology) Deboraha Sprang, MD (Cardiology) Sable Feil, MD (Gastroenterology) Brand Males, MD (Pulmonary Disease) Renato Shin, MD (Inactive) (Endocrinology) Erline Levine, MD (Neurosurgery) Weber Cooks, MD (Inactive) (Orthopedic Surgery) Rozetta Nunnery, MD (Inactive) (Otolaryngology)   HPI  Stephanie Cortez is a 88 y.o. female Seen in followup for permanent atrial fibrillation having failed multiple cardioversions and antiarrhythmic drugs.  She is maintained on coumadin; she was seen by Dr. Rayann Heman 4/22 with a change to Eliquis.  She has a venous insufficiency followed by vascular surgery  She was admitted 12/14  [place]  because of chest pain elevated blood pressure and   atrial fibrillation with a rapid rate. She underwent a catheterization which was nonobstructive  Echocardiogram was also normal except for left atrial enlargement.    Sh has been diagnosed with ILD and is followed by Dr Donald Siva with dyspnea.  Edema is resolved.  Using Lasix as needed.  No chest pain.  Complains of being cold.    Cocker spaniel named Elyse Hsu and  sister died fall 2017-09-25  Date Cr K Hgb  1/19 0.76 4.8 12.7   8/20 0.75 4.7 11.3  10/23 0.55 4.4 41.3   Thromboembolic risk factors ( age -43, HTN-1, , Gender-1) for a CHADSVASc Score of >=4      Past Medical History:  Diagnosis Date   Anemia    - Hgb 9.7gm% on 07/13/2008 in [place] -  Hgg 129gm% wiht normal irone levsl and ferritin 10/27/2008 in [place] Recurrent otitis/sinusitis   Anxiety    chronic BZ prn   Atrial fibrillation (HCC)    chronic anticoag   Bronchiectasis    >PFT 07/13/2008 in [place] 1.9L/76%, FVC 2.45L/74, Ratio 79, TLC 121%, DLCO 64%  AE BRonchiectasis - Dec 2010.New Rx:  outpatient - Feb 2011 - Rx outpatient   CHF (congestive heart failure) ([place])    COPD (chronic  obstructive pulmonary disease) (HCC)    bronchiectasis   Cricopharyngeal achalasia    Depression    Diverticulosis    Dyslipidemia    Eczema    Fatty liver    GERD with stricture    Glaucoma    H. 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[place])    Ventral hernia    Vertigo    Wears glasses     Past Surgical History:  Procedure Laterality Date   BOWEL RESECTION N/A 02/08/2020   Procedure: SMALL BOWEL RESECTION;  Surgeon: Donnie Mesa, MD;  Location: [place];  Service: General;  Laterality: N/A;   BREAST SURGERY     br bx   CARDIAC CATHETERIZATION  07/01/2013   CATARACT EXTRACTION     both   COLONOSCOPY     ESOPHAGOSCOPY W/ BOTOX INJECTION  12/11/2011   Procedure: ESOPHAGOSCOPY WITH BOTOX INJECTION;  Surgeon: Rozetta Nunnery, MD;  Location: [place];  Service: ENT;  Laterality: N/A;  esophogoscopy with dilation, botox injection   FOOT SURGERY  03/14/2011   gastroc slide-rt   GASTROSTOMY N/A 02/08/2020   Procedure: INSERTION OF GASTROSTOMY TUBE;  Surgeon: Donnie Mesa, MD;  Location: [place];  Service: General;  Laterality: N/A;   HIP ARTHROPLASTY Right 10/28/2019   Procedure: ARTHROPLASTY BIPOLAR HIP (HEMIARTHROPLASTY);  Surgeon: Marybelle Killings, MD;  Location: [facility];  Service: Orthopedics;  Laterality: Right;   LAPAROTOMY N/A 02/08/2020   Procedure: EXPLORATORY LAPAROTOMY;  Surgeon: Donnie Mesa, MD;  Location: [place];  Service: General;  Laterality: N/A;   LYSIS OF ADHESION N/A 02/08/2020   Procedure: LYSIS OF ADHESIONS;  Surgeon: Donnie Mesa, MD;  Location: [place];  Service: General;  Laterality: N/A;   TOTAL ABDOMINAL HYSTERECTOMY     TOTAL HIP REVISION Right 11/16/2019   Procedure: TOTAL HIP REVISION BIPOLAR TO CEMENTED BIPOLAR;  Surgeon: Marybelle Killings, MD;  Location: [place];  Service: Orthopedics;   Laterality: Right;   WISDOM TOOTH EXTRACTION      Current Outpatient Medications  Medication Sig Dispense Refill   acetaminophen (TYLENOL) 325 MG tablet Take 2 tablets (650 mg total) by mouth every 6 (six) hours as needed for mild pain (or Fever >/= 101).     apixaban (ELIQUIS) 2.5 MG TABS tablet Take 1 tablet (2.5 mg total) by mouth 2 (two) times daily. Needs cardiology appt for refills, call office 60 tablet 1   FLUoxetine (PROZAC) 20 MG capsule Take 1 capsule (20 mg total) by mouth daily. 90 capsule 3   furosemide (LASIX) 20 MG tablet Take 1 tablet (20 mg total) by mouth daily. 90 tablet 3   levothyroxine (SYNTHROID) 50 MCG tablet Take 1 tablet (50 mcg total) by mouth daily. 90 tablet 3   omeprazole (PRILOSEC) 40 MG capsule Take 1 capsule (40 mg total) by mouth 2 (two) times daily before a meal. 90 capsule 2   Polyethylene Glycol 3350 (MIRALAX PO) Take by mouth.     potassium chloride (KLOR-CON) 8 MEQ tablet Take 2 tablets (16 mEq total) by mouth daily. 180 tablet 3   pravastatin (PRAVACHOL) 40 MG tablet Take 1 tablet (40 mg total) by mouth at bedtime. 90 tablet 3   psyllium (METAMUCIL) 58.6 % packet Take 1 packet by mouth daily as needed (constipation).     SYMBICORT 80-4.5 MCG/ACT inhaler INHALE 2 PUFFS INTO THE LUNGS TWICE DAILY 10.2 g 3   traZODone (DESYREL) 50 MG tablet Take 1 tablet (50 mg total) by mouth at bedtime as needed. 90 tablet 3   No current facility-administered medications for this visit.    Allergies  Allergen Reactions   Doxycycline Other (See Comments)    Rash on face and neck   Hydrocodone Other (See Comments)    Other reaction(s): hallucinations   Tramadol     Constipation, abdominal obstruction leading to weakness after using this   Vicodin [Hydrocodone-Acetaminophen] Other (See Comments)    Hallucinations    Flagyl [Metronidazole] Nausea And Vomiting   Moxifloxacin Other (See Comments)     Weakness/fatigue   Pantoprazole Sodium Rash   Penicillins Rash     Has patient had a PCN reaction causing immediate rash, facial/tongue/throat swelling, SOB or lightheadedness with hypotension:unsure Has patient had a PCN reaction causing severe rash involving mucus membranes or skin necrosis:unsure Has patient had a PCN reaction that required hospitalization:No Has patient had a PCN reaction occurring within the last 10 years:Yes Cannot recall exact reaction due to time lapse If all of the above answers are "NO", then may proceed with Cephalosporin use.  Other reaction(s): Unknown    Review of Systems negative except from HPI and PMH  Physical Exam BP 135/82 (BP Location: Right Arm, Patient Position: Standing)  Pulse 99   Ht '5\' 6"'$  (1.676 m)   Wt 142 lb 12.8 oz (64.8 kg)   LMP  (LMP Unknown)   SpO2 97%   BMI 23.05 kg/m  Well developed and nourished in no acute distress HENT normal Neck supple Clear Irregularly Irregular rate and rhythm with  controlled  ventricular response  Abd-soft with active BS No Clubbing cyanosis edema Skin-warm and dry A & Oriented  Grossly normal sensory and motor function  ECG atrial fibrillation at 101 Interval-/13/37 Right bundle branch block unchanged from 12/22   Assessment and plan  Atrial fibrillation-permanent  HFpEF  Hypertension  Interstitial lung disease  Dyspnea on exertion  Anemia   Right bundle branch block  She is euvolemic.  She will be using her diuretics on an as-needed basis.  Also have her use her potassium on an as-needed basis.  Continue calf compression on an as-needed basis   Anemic.  Will have to get blood work checked when she sees Dr. Chase Caller.  I will alert him to this need

## 2022-06-14 ENCOUNTER — Encounter: Payer: Self-pay | Admitting: *Deleted

## 2022-06-14 ENCOUNTER — Telehealth: Payer: Self-pay | Admitting: Family Medicine

## 2022-06-14 NOTE — Telephone Encounter (Signed)
Copied from [place] [id number]. Topic: Medicare AWV >> Jun 14, 2022 12:01 PM Gillis Santa wrote: Reason for CRM: lvm PATIENT CALL [phone] TO SCHEDULE AWVI [place]

## 2022-06-19 ENCOUNTER — Ambulatory Visit: Payer: Medicare Other | Admitting: Internal Medicine

## 2022-07-06 ENCOUNTER — Ambulatory Visit (INDEPENDENT_AMBULATORY_CARE_PROVIDER_SITE_OTHER): Payer: Medicare Other | Admitting: Family Medicine

## 2022-07-06 ENCOUNTER — Encounter: Payer: Self-pay | Admitting: Family Medicine

## 2022-07-06 VITALS — BP 122/64 | HR 92 | Temp 97.5°F | Resp 16 | Ht 66.0 in | Wt 142.0 lb

## 2022-07-06 DIAGNOSIS — I5032 Chronic diastolic (congestive) heart failure: Secondary | ICD-10-CM | POA: Diagnosis not present

## 2022-07-06 DIAGNOSIS — I4821 Permanent atrial fibrillation: Secondary | ICD-10-CM

## 2022-07-06 DIAGNOSIS — R739 Hyperglycemia, unspecified: Secondary | ICD-10-CM

## 2022-07-06 DIAGNOSIS — I10 Essential (primary) hypertension: Secondary | ICD-10-CM

## 2022-07-06 DIAGNOSIS — E039 Hypothyroidism, unspecified: Secondary | ICD-10-CM

## 2022-07-06 DIAGNOSIS — D509 Iron deficiency anemia, unspecified: Secondary | ICD-10-CM | POA: Diagnosis not present

## 2022-07-06 DIAGNOSIS — E785 Hyperlipidemia, unspecified: Secondary | ICD-10-CM | POA: Diagnosis not present

## 2022-07-06 DIAGNOSIS — J849 Interstitial pulmonary disease, unspecified: Secondary | ICD-10-CM | POA: Diagnosis not present

## 2022-07-06 DIAGNOSIS — R300 Dysuria: Secondary | ICD-10-CM

## 2022-07-06 NOTE — Patient Instructions (Addendum)
Flu shot when you are feeling better- here or pharmacy  Consider shingrix at pharmacy when better- not covered here  Possible UTI. Will get urine culture and treat only if positive Patient to follow up if new or worsening symptoms or failure to improve.   Try flonase for 2 weeks and if ear not improving or worsens- schedule follow up with Ear, nose, throat  Please stop by lab before you go- blood and urine If you have mychart- we will send your results within 3 business days of [place] receiving them.  If you do not have mychart- we will call you about results within 5 business days of [place] receiving them.  *please also note that you will see labs on mychart as soon as they post. I will later go in and write notes on them- will say "notes from Dr. Yong Channel"   Recommended follow up: Return in about 2 months (around 09/04/2022) for followup or sooner if needed.Schedule b4 you leave.

## 2022-07-06 NOTE — Progress Notes (Signed)
Phone [phone] In person visit   Subjective:   Stephanie Cortez is a 89 y.o. year old very pleasant female patient who presents for/with See problem oriented charting Chief Complaint  Patient presents with   Follow-up    Follow up    Past Medical History-  Patient Active Problem List   Diagnosis Date Noted   ILD (interstitial lung disease) ([place]) 07/12/2014    Priority: High   Heart failure with preserved ejection fraction ([place]) 07/08/2013    Priority: High   A-fib ([place])     Priority: High   Hyperlipidemia, unspecified 04/16/2022    Priority: Medium    Constipation 07/27/2021    Priority: Medium    Essential hypertension     Priority: Medium    Upper airway cough syndrome 09/23/2013    Priority: Medium    Coronary artery calcification seen on CAT scan 03/15/2013    Priority: Medium    GERD (gastroesophageal reflux disease) 01/01/2012    Priority: Medium    Hypothyroid 11/23/2010    Priority: Medium    Periprosthetic fracture around internal prosthetic hip joint 11/16/2019    Priority: 1.   Periprosthetic fracture around internal prosthetic hip joint, initial encounter     Priority: 1.   Subcapital fracture of femur ([place]) 10/30/2019    Priority: 1.   Cricopharyngeal achalasia 06/24/2021   Disorder of cervical esophageal region 06/23/2021   Chronic anemia 06/18/2021   COVID-19 virus infection 06/17/2021   Chronic pain of right knee 06/20/2020   S/P percutaneous endoscopic gastrostomy (PEG) tube placement ([place])    Debility 02/17/2020   Mesenteric ischemia (HCC)    Small bowel ischemia ([place]) 02/08/2020   Chronic anticoagulation    Acute blood loss anemia    Leukocytosis    Closed right hip fracture, initial encounter ([place]) 10/27/2019   Hoarseness 02/19/2018   Sensorineural hearing loss (SNHL) of both ears 02/19/2018   Tinnitus, bilateral 02/19/2018   Cough 09/06/2017   Acute bronchitis 02/20/2016   Encounter for preoperative pulmonary examination 01/04/2016    NSIP (nonspecific interstitial pneumonia) ([place]) 09/26/2015   Chronic sinusitis 09/26/2015   Nodule of left lung 03/15/2013   Orthostatic lightheadedness 02/09/2013   Depression    Diverticulitis 01/01/2012   Edema 10/04/2010   Bronchiectasis ([place])     Medications- reviewed and updated Current Outpatient Medications  Medication Sig Dispense Refill   acetaminophen (TYLENOL) 325 MG tablet Take 2 tablets (650 mg total) by mouth every 6 (six) hours as needed for mild pain (or Fever >/= 101).     apixaban (ELIQUIS) 2.5 MG TABS tablet Take 1 tablet (2.5 mg total) by mouth 2 (two) times daily. Needs cardiology appt for refills, call office 60 tablet 1   FLUoxetine (PROZAC) 20 MG capsule Take 1 capsule (20 mg total) by mouth daily. 90 capsule 3   furosemide (LASIX) 20 MG tablet Take 1 tablet (20 mg total) by mouth as needed. 90 tablet 1   levothyroxine (SYNTHROID) 50 MCG tablet Take 1 tablet (50 mcg total) by mouth daily. 90 tablet 3   omeprazole (PRILOSEC) 40 MG capsule Take 1 capsule (40 mg total) by mouth 2 (two) times daily before a meal. 90 capsule 2   Polyethylene Glycol 3350 (MIRALAX PO) Take by mouth.     potassium chloride (KLOR-CON) 8 MEQ tablet Take 2 tablets (16 mEq total) by mouth as needed. 90 tablet 0   pravastatin (PRAVACHOL) 40 MG tablet Take 1 tablet (40 mg total) by mouth at bedtime.  90 tablet 3   psyllium (METAMUCIL) 58.6 % packet Take 1 packet by mouth daily as needed (constipation).     SYMBICORT 80-4.5 MCG/ACT inhaler INHALE 2 PUFFS INTO THE LUNGS TWICE DAILY 10.2 g 3   traZODone (DESYREL) 50 MG tablet Take 1 tablet (50 mg total) by mouth at bedtime as needed. 90 tablet 3   No current facility-administered medications for this visit.     Objective:  BP 122/64 (BP Location: Right Arm, Patient Position: Sitting, Cuff Size: Normal)   Pulse 92   Temp (!) 97.5 F (36.4 C) (Oral)   Resp 16   Ht '5\' 6"'$  (1.676 m)   Wt 142 lb (64.4 kg)   LMP  (LMP Unknown)   SpO2 94%    BMI 22.92 kg/m  Gen: NAD, resting comfortably Effusion behind right ear without hearing loss, left TM normal (despite being ear she reports as having issue), some drainage in pharynx CV: irregularly irregular  Lungs: diffuse wheezing. No significant respiratory distress increase compared to baseline.  Abdomen: soft/nontender/nondistended/normal bowel sounds.   Ext: no edema Skin: warm, dry     Assessment and Plan   #Concern for UTI S: Patients symptoms started well over a week ago- possibly 2-3.  Complains of dysuria: yes; polyuria: yes; nocturia: no; urgency: yes. Feels run down but just over a month- may predate urinary symptoms Symptoms are slightly worse. Can have subjective warmth ROS- no chills, nausea, vomiting, flank pain. No blood in urine.  A/P:  Possible UTI. Will get urine culture and treat only if positive Patient to follow up if new or worsening symptoms or failure to improve.   # Hyperglycemia/insulin resistance/prediabetes S:  Medication: none Lab Results  Component Value Date   HGBA1C 5.3 02/09/2020   HGBA1C 5.4 10/07/2012   HGBA1C 5.7 04/06/2011   A/P: with prior a1c elevation wants to update this with labs  #Left ear pain for a week- has tried drops for itchiness given by Dr. Sabino Gasser ENT doctor but no improvement on drops were for itching- not for infection- fluocinolone" - on exam OME on right but not on affected ear- she is going to try flonase to see if that helps   %ILD- followed by DR. Ramaswamy- remains on symbicort (he notes not COPD or asthma) . Ongoing phlegm issues- upcoming visit with DR. Ramaswamy. Had covid 76 in late October and recovered well thankfully.  -not on antifibrotics -appears overall stable- continue current medications   % GI issues- cricopharyngeal achalasia (previously on botox), bistory of segmental colitis associated with diverticulosis (previously on mesalamine), history of small bowel ischemia requiring resection in 2021  S: saw  ENT Dr. Sabino Gasser- plan was for barium swallow and referral to [facility]  A/P: ongoing issues- has been referred by ENT to laryngology in [place] which is hard for her- I would recommend getting Dr. Vena Rua opinion since transport is not easy after discussing first with Dr. Brantley Persons   # GERD- Follows with Dr. Hilarie Fredrickson S:Medication:  omeprazole 40 mg BID A/P: suspect stable- continue current medications    #mild anemia- on oral iron daily (if at least stable we recommended trying every other day- some literature suggests better absorption). Also update levels    # Atrial fibrillation- sees Dr. Caryl Comes S: Rate controlled with no medication Anticoagulated with eliquis 2.5 mg BID  A/P: appropriately anticoagulated and rate controlled without meds-  continue current medicine for anticoagulation   #Heart failure with preserved ejection fraction S: Medication: lasix 20 mg  daily as needed now- basically will take if weight goes up or if swells A/P: stable without worsening volume status despite changing lasix to as needed   # Depression S: Medication: fluoxetine 20 mg, trazodone 50 mg    07/06/2022    3:15 PM 04/16/2022    3:15 PM 08/01/2020    1:06 PM  Depression screen PHQ 2/9  Decreased Interest 0 0 0  Down, Depressed, Hopeless 0 0 0  PHQ - 2 Score 0 0 0  Altered sleeping  0   Tired, decreased energy  0   Change in appetite  0   Feeling bad or failure about yourself   0   Trouble concentrating  0   Moving slowly or fidgety/restless  0   Suicidal thoughts  0   PHQ-9 Score  0   Difficult doing work/chores  Not difficult at all   A/P: full remission- continue current medications   #hypertension S: medication: prn furosemide 20 mg now.  Does require potassium repletion with 8 mill equivalents daily only as needed.  - in the past benicar, bystolic, diltiazem all at once in 2021- dealt with orthostatic  BP Readings from Last 3 Encounters:  07/06/22 122/64  06/08/22 135/82   04/16/22 122/74  A/P: stable- continue current medicines   #hyperlipidemia S: Medication: pravastatin 40 mg Lab Results  Component Value Date   CHOL 130 04/16/2022   HDL 56.00 04/16/2022   LDLCALC 63 04/16/2022   TRIG 51.0 04/16/2022   CHOLHDL 2 04/16/2022  A/P: stable- continue current medicines   #hypothyroidism S: compliant On thyroid medication- levothyroxine 50 mcg A/P:with fatigue check tsh with labs- continue current medications for now   Recommended follow up: Return in about 2 months (around 09/04/2022) for followup or sooner if needed.Schedule b4 you leave. Future Appointments  Date Time Provider [place]  07/20/2022  2:00 PM Brand Males, MD [facility] None   Lab/Order associations:   ICD-10-CM   1. Essential hypertension  I10 CBC with Differential/Platelet    Comprehensive metabolic panel    2. Iron deficiency anemia, unspecified iron deficiency anemia type  D50.9 Iron, TIBC and Ferritin Panel    3. Hyperlipidemia, unspecified hyperlipidemia type  E78.5     4. Acquired hypothyroidism  E03.9 TSH    5. Chronic heart failure with preserved ejection fraction (HCC) Chronic I50.32     6. Permanent atrial fibrillation (HCC) Chronic I48.21     7. ILD (interstitial lung disease) (HCC) Chronic J84.9     8. Hyperglycemia  R73.9 HgB A1c    9. Dysuria  R30.0 Urine Culture     No orders of the defined types were placed in this encounter.  Return precautions advised.  Garret Reddish, MD

## 2022-07-07 LAB — CBC WITH DIFFERENTIAL/PLATELET
Absolute Monocytes: 659 cells/uL (ref 200–950)
Basophils Absolute: 67 cells/uL (ref 0–200)
Basophils Relative: 0.9 %
Eosinophils Absolute: 562 cells/uL — ABNORMAL HIGH (ref 15–500)
Eosinophils Relative: 7.6 %
HCT: 34.1 % — ABNORMAL LOW (ref 35.0–45.0)
Hemoglobin: 11 g/dL — ABNORMAL LOW (ref 11.7–15.5)
Lymphs Abs: 1517 cells/uL (ref 850–3900)
MCH: 28.4 pg (ref 27.0–33.0)
MCHC: 32.3 g/dL (ref 32.0–36.0)
MCV: 87.9 fL (ref 80.0–100.0)
MPV: 9 fL (ref 7.5–12.5)
Monocytes Relative: 8.9 %
Neutro Abs: 4595 cells/uL (ref 1500–7800)
Neutrophils Relative %: 62.1 %
Platelets: 323 10*3/uL (ref 140–400)
RBC: 3.88 10*6/uL (ref 3.80–5.10)
RDW: 13.8 % (ref 11.0–15.0)
Total Lymphocyte: 20.5 %
WBC: 7.4 10*3/uL (ref 3.8–10.8)

## 2022-07-07 LAB — COMPREHENSIVE METABOLIC PANEL
AG Ratio: 1.2 (calc) (ref 1.0–2.5)
ALT: 11 U/L (ref 6–29)
AST: 19 U/L (ref 10–35)
Albumin: 3.8 g/dL (ref 3.6–5.1)
Alkaline phosphatase (APISO): 115 U/L (ref 37–153)
BUN/Creatinine Ratio: 26 (calc) — ABNORMAL HIGH (ref 6–22)
BUN: 14 mg/dL (ref 7–25)
CO2: 29 mmol/L (ref 20–32)
Calcium: 9 mg/dL (ref 8.6–10.4)
Chloride: 101 mmol/L (ref 98–110)
Creat: 0.54 mg/dL — ABNORMAL LOW (ref 0.60–0.95)
Globulin: 3.1 g/dL (calc) (ref 1.9–3.7)
Glucose, Bld: 92 mg/dL (ref 65–99)
Potassium: 4.4 mmol/L (ref 3.5–5.3)
Sodium: 137 mmol/L (ref 135–146)
Total Bilirubin: 0.7 mg/dL (ref 0.2–1.2)
Total Protein: 6.9 g/dL (ref 6.1–8.1)

## 2022-07-07 LAB — IRON,TIBC AND FERRITIN PANEL
%SAT: 5 % (calc) — ABNORMAL LOW (ref 16–45)
Ferritin: 35 ng/mL (ref 16–288)
Iron: 19 ug/dL — ABNORMAL LOW (ref 45–160)
TIBC: 366 mcg/dL (calc) (ref 250–450)

## 2022-07-07 LAB — HEMOGLOBIN A1C
Hgb A1c MFr Bld: 5.7 % of total Hgb — ABNORMAL HIGH (ref <5.7)
Mean Plasma Glucose: 117 mg/dL
eAG (mmol/L): 6.5 mmol/L

## 2022-07-11 LAB — URINE CULTURE
MICRO NUMBER:: 14394351
SPECIMEN QUALITY:: ADEQUATE

## 2022-07-11 LAB — TSH: TSH: 2.54 mIU/L (ref 0.40–4.50)

## 2022-07-16 ENCOUNTER — Encounter: Payer: Self-pay | Admitting: Family Medicine

## 2022-07-16 MED ORDER — BENZONATATE 100 MG PO CAPS: 100.0000 mg | ORAL_CAPSULE | Freq: Two times a day (BID) | ORAL | 0 refills | Status: DC | PRN

## 2022-07-20 ENCOUNTER — Encounter (HOSPITAL_COMMUNITY): Payer: Self-pay | Admitting: Emergency Medicine

## 2022-07-20 ENCOUNTER — Inpatient Hospital Stay (HOSPITAL_COMMUNITY)
Admission: EM | Admit: 2022-07-20 | Discharge: 2022-07-26 | DRG: 196 | Disposition: A | Discharge status: Skilled Nursing Facility | Payer: Commercial Managed Care - PPO | Attending: Internal Medicine | Admitting: Internal Medicine

## 2022-07-20 ENCOUNTER — Encounter: Payer: Self-pay | Admitting: Internal Medicine

## 2022-07-20 ENCOUNTER — Emergency Department (HOSPITAL_COMMUNITY): Payer: Medicare Other

## 2022-07-20 ENCOUNTER — Other Ambulatory Visit: Payer: Self-pay

## 2022-07-20 ENCOUNTER — Telehealth: Payer: Self-pay | Admitting: Internal Medicine

## 2022-07-20 ENCOUNTER — Encounter: Payer: Medicare Other | Admitting: Internal Medicine

## 2022-07-20 DIAGNOSIS — Z66 Do not resuscitate: Secondary | ICD-10-CM

## 2022-07-20 DIAGNOSIS — E039 Hypothyroidism, unspecified: Secondary | ICD-10-CM | POA: Diagnosis present

## 2022-07-20 DIAGNOSIS — I110 Hypertensive heart disease with heart failure: Secondary | ICD-10-CM | POA: Diagnosis present

## 2022-07-20 DIAGNOSIS — R0602 Shortness of breath: Secondary | ICD-10-CM | POA: Diagnosis not present

## 2022-07-20 DIAGNOSIS — J9600 Acute respiratory failure, unspecified whether with hypoxia or hypercapnia: Secondary | ICD-10-CM | POA: Diagnosis not present

## 2022-07-20 DIAGNOSIS — K5900 Constipation, unspecified: Secondary | ICD-10-CM | POA: Diagnosis not present

## 2022-07-20 DIAGNOSIS — I4510 Unspecified right bundle-branch block: Secondary | ICD-10-CM | POA: Diagnosis not present

## 2022-07-20 DIAGNOSIS — D649 Anemia, unspecified: Secondary | ICD-10-CM | POA: Diagnosis not present

## 2022-07-20 DIAGNOSIS — Z96641 Presence of right artificial hip joint: Secondary | ICD-10-CM | POA: Diagnosis present

## 2022-07-20 DIAGNOSIS — E876 Hypokalemia: Secondary | ICD-10-CM | POA: Diagnosis present

## 2022-07-20 DIAGNOSIS — J849 Interstitial pulmonary disease, unspecified: Secondary | ICD-10-CM | POA: Diagnosis not present

## 2022-07-20 DIAGNOSIS — K219 Gastro-esophageal reflux disease without esophagitis: Secondary | ICD-10-CM | POA: Diagnosis not present

## 2022-07-20 DIAGNOSIS — B974 Respiratory syncytial virus as the cause of diseases classified elsewhere: Secondary | ICD-10-CM | POA: Diagnosis present

## 2022-07-20 DIAGNOSIS — F419 Anxiety disorder, unspecified: Secondary | ICD-10-CM | POA: Diagnosis present

## 2022-07-20 DIAGNOSIS — Z7901 Long term (current) use of anticoagulants: Secondary | ICD-10-CM

## 2022-07-20 DIAGNOSIS — B338 Other specified viral diseases: Secondary | ICD-10-CM | POA: Diagnosis not present

## 2022-07-20 DIAGNOSIS — J449 Chronic obstructive pulmonary disease, unspecified: Secondary | ICD-10-CM | POA: Diagnosis present

## 2022-07-20 DIAGNOSIS — Z8249 Family history of ischemic heart disease and other diseases of the circulatory system: Secondary | ICD-10-CM

## 2022-07-20 DIAGNOSIS — I4821 Permanent atrial fibrillation: Secondary | ICD-10-CM | POA: Diagnosis present

## 2022-07-20 DIAGNOSIS — K760 Fatty (change of) liver, not elsewhere classified: Secondary | ICD-10-CM | POA: Diagnosis present

## 2022-07-20 DIAGNOSIS — Z880 Allergy status to penicillin: Secondary | ICD-10-CM

## 2022-07-20 DIAGNOSIS — Z79890 Hormone replacement therapy (postmenopausal): Secondary | ICD-10-CM

## 2022-07-20 DIAGNOSIS — Z9841 Cataract extraction status, right eye: Secondary | ICD-10-CM

## 2022-07-20 DIAGNOSIS — R0902 Hypoxemia: Secondary | ICD-10-CM

## 2022-07-20 DIAGNOSIS — J9601 Acute respiratory failure with hypoxia: Secondary | ICD-10-CM | POA: Diagnosis not present

## 2022-07-20 DIAGNOSIS — I5032 Chronic diastolic (congestive) heart failure: Secondary | ICD-10-CM | POA: Diagnosis present

## 2022-07-20 DIAGNOSIS — Z1152 Encounter for screening for COVID-19: Secondary | ICD-10-CM

## 2022-07-20 DIAGNOSIS — Z79899 Other long term (current) drug therapy: Secondary | ICD-10-CM

## 2022-07-20 DIAGNOSIS — F05 Delirium due to known physiological condition: Secondary | ICD-10-CM | POA: Diagnosis not present

## 2022-07-20 DIAGNOSIS — Z888 Allergy status to other drugs, medicaments and biological substances status: Secondary | ICD-10-CM

## 2022-07-20 DIAGNOSIS — Z9842 Cataract extraction status, left eye: Secondary | ICD-10-CM

## 2022-07-20 DIAGNOSIS — F32A Depression, unspecified: Secondary | ICD-10-CM | POA: Diagnosis present

## 2022-07-20 DIAGNOSIS — Z7401 Bed confinement status: Secondary | ICD-10-CM | POA: Diagnosis not present

## 2022-07-20 DIAGNOSIS — Z7951 Long term (current) use of inhaled steroids: Secondary | ICD-10-CM

## 2022-07-20 DIAGNOSIS — Z825 Family history of asthma and other chronic lower respiratory diseases: Secondary | ICD-10-CM

## 2022-07-20 DIAGNOSIS — R54 Age-related physical debility: Secondary | ICD-10-CM | POA: Diagnosis present

## 2022-07-20 DIAGNOSIS — R000 Tachycardia, unspecified: Secondary | ICD-10-CM | POA: Diagnosis not present

## 2022-07-20 DIAGNOSIS — E861 Hypovolemia: Secondary | ICD-10-CM | POA: Diagnosis present

## 2022-07-20 DIAGNOSIS — Z515 Encounter for palliative care: Secondary | ICD-10-CM

## 2022-07-20 DIAGNOSIS — I10 Essential (primary) hypertension: Secondary | ICD-10-CM | POA: Diagnosis not present

## 2022-07-20 DIAGNOSIS — E785 Hyperlipidemia, unspecified: Secondary | ICD-10-CM | POA: Diagnosis present

## 2022-07-20 DIAGNOSIS — Z8616 Personal history of COVID-19: Secondary | ICD-10-CM | POA: Diagnosis not present

## 2022-07-20 DIAGNOSIS — Z823 Family history of stroke: Secondary | ICD-10-CM

## 2022-07-20 DIAGNOSIS — H903 Sensorineural hearing loss, bilateral: Secondary | ICD-10-CM | POA: Diagnosis not present

## 2022-07-20 DIAGNOSIS — I5030 Unspecified diastolic (congestive) heart failure: Secondary | ICD-10-CM | POA: Diagnosis present

## 2022-07-20 DIAGNOSIS — I2510 Atherosclerotic heart disease of native coronary artery without angina pectoris: Secondary | ICD-10-CM | POA: Diagnosis present

## 2022-07-20 DIAGNOSIS — Z9981 Dependence on supplemental oxygen: Secondary | ICD-10-CM | POA: Diagnosis not present

## 2022-07-20 DIAGNOSIS — G629 Polyneuropathy, unspecified: Secondary | ICD-10-CM | POA: Diagnosis present

## 2022-07-20 DIAGNOSIS — R911 Solitary pulmonary nodule: Secondary | ICD-10-CM | POA: Diagnosis not present

## 2022-07-20 DIAGNOSIS — H409 Unspecified glaucoma: Secondary | ICD-10-CM | POA: Diagnosis present

## 2022-07-20 DIAGNOSIS — K220 Achalasia of cardia: Secondary | ICD-10-CM | POA: Diagnosis not present

## 2022-07-20 DIAGNOSIS — K589 Irritable bowel syndrome without diarrhea: Secondary | ICD-10-CM | POA: Diagnosis present

## 2022-07-20 LAB — BASIC METABOLIC PANEL
Anion gap: 8 (ref 5–15)
BUN: 6 mg/dL — ABNORMAL LOW (ref 8–23)
CO2: 27 mmol/L (ref 22–32)
Calcium: 9 mg/dL (ref 8.9–10.3)
Chloride: 101 mmol/L (ref 98–111)
Creatinine, Ser: 0.54 mg/dL (ref 0.44–1.00)
GFR, Estimated: >60 mL/min (ref >60)
Glucose, Bld: 115 mg/dL — ABNORMAL HIGH (ref 70–99)
Potassium: 3.3 mmol/L — ABNORMAL LOW (ref 3.5–5.1)
Sodium: 136 mmol/L (ref 135–145)

## 2022-07-20 LAB — CBC
HCT: 37.7 % (ref 36.0–46.0)
Hemoglobin: 11.8 g/dL — ABNORMAL LOW (ref 12.0–15.0)
MCH: 27.9 pg (ref 26.0–34.0)
MCHC: 31.3 g/dL (ref 30.0–36.0)
MCV: 89.1 fL (ref 80.0–100.0)
Platelets: 322 10*3/uL (ref 150–400)
RBC: 4.23 MIL/uL (ref 3.87–5.11)
RDW: 14.8 % (ref 11.5–15.5)
WBC: 6.1 10*3/uL (ref 4.0–10.5)
nRBC: 0 % (ref 0.0–0.2)

## 2022-07-20 LAB — RESPIRATORY PANEL BY PCR

## 2022-07-20 LAB — EXPECTORATED SPUTUM ASSESSMENT W GRAM STAIN, RFLX TO RESP C

## 2022-07-20 LAB — URINALYSIS, ROUTINE W REFLEX MICROSCOPIC
Bilirubin Urine: NEGATIVE
Glucose, UA: NEGATIVE mg/dL
Hgb urine dipstick: NEGATIVE
Ketones, ur: NEGATIVE mg/dL
Leukocytes,Ua: NEGATIVE
Nitrite: NEGATIVE
Protein, ur: NEGATIVE mg/dL
Specific Gravity, Urine: 1.005 (ref 1.005–1.030)
pH: 6 (ref 5.0–8.0)

## 2022-07-20 LAB — RESP PANEL BY RT-PCR (RSV, FLU A&B, COVID)  RVPGX2
Influenza A by PCR: NEGATIVE
Influenza B by PCR: NEGATIVE
Resp Syncytial Virus by PCR: POSITIVE — AB
SARS Coronavirus 2 by RT PCR: NEGATIVE

## 2022-07-20 LAB — PROCALCITONIN: Procalcitonin: <0.1 ng/mL

## 2022-07-20 LAB — BRAIN NATRIURETIC PEPTIDE: B Natriuretic Peptide: 155.6 pg/mL — ABNORMAL HIGH (ref 0.0–100.0)

## 2022-07-20 MED ORDER — ACETAMINOPHEN 325 MG PO TABS
650.0000 mg | ORAL_TABLET | Freq: Four times a day (QID) | ORAL | Status: DC | PRN
  Administered 2022-07-25 (×2): 650 mg via ORAL
  Filled 2022-07-20 (×2): qty 2

## 2022-07-20 MED ORDER — IPRATROPIUM-ALBUTEROL 0.5-2.5 (3) MG/3ML IN SOLN
3.0000 mL | Freq: Four times a day (QID) | RESPIRATORY_TRACT | Status: DC
  Administered 2022-07-20 – 2022-07-21 (×4): 3 mL via RESPIRATORY_TRACT
  Filled 2022-07-20 (×4): qty 3

## 2022-07-20 MED ORDER — ARFORMOTEROL TARTRATE 15 MCG/2ML IN NEBU
15.0000 ug | INHALATION_SOLUTION | Freq: Two times a day (BID) | RESPIRATORY_TRACT | Status: DC
  Administered 2022-07-20 – 2022-07-26 (×12): 15 ug via RESPIRATORY_TRACT
  Filled 2022-07-20 (×12): qty 2

## 2022-07-20 MED ORDER — POTASSIUM CHLORIDE CRYS ER 20 MEQ PO TBCR
40.0000 meq | EXTENDED_RELEASE_TABLET | Freq: Once | ORAL | Status: AC
  Administered 2022-07-20: 40 meq via ORAL
  Filled 2022-07-20: qty 2

## 2022-07-20 MED ORDER — ACETAMINOPHEN 650 MG RE SUPP: 650.0000 mg | Freq: Four times a day (QID) | RECTAL | Status: DC | PRN

## 2022-07-20 MED ORDER — PROCHLORPERAZINE EDISYLATE 10 MG/2ML IJ SOLN
10.0000 mg | Freq: Four times a day (QID) | INTRAMUSCULAR | Status: DC | PRN
  Administered 2022-07-25: 10 mg via INTRAVENOUS
  Filled 2022-07-20: qty 2

## 2022-07-20 MED ORDER — LEVOTHYROXINE SODIUM 50 MCG PO TABS
50.0000 ug | ORAL_TABLET | Freq: Every day | ORAL | Status: DC
  Administered 2022-07-21 – 2022-07-26 (×6): 50 ug via ORAL
  Filled 2022-07-20 (×6): qty 1

## 2022-07-20 MED ORDER — FUROSEMIDE 10 MG/ML IJ SOLN
40.0000 mg | Freq: Once | INTRAMUSCULAR | Status: AC
  Administered 2022-07-20: 40 mg via INTRAVENOUS
  Filled 2022-07-20: qty 4

## 2022-07-20 MED ORDER — PRAVASTATIN SODIUM 40 MG PO TABS
40.0000 mg | ORAL_TABLET | Freq: Every day | ORAL | Status: DC
  Administered 2022-07-20 – 2022-07-25 (×6): 40 mg via ORAL
  Filled 2022-07-20 (×6): qty 1

## 2022-07-20 MED ORDER — GUAIFENESIN ER 600 MG PO TB12
600.0000 mg | ORAL_TABLET | Freq: Two times a day (BID) | ORAL | Status: DC
  Administered 2022-07-20 – 2022-07-26 (×12): 600 mg via ORAL
  Filled 2022-07-20 (×12): qty 1

## 2022-07-20 MED ORDER — BUDESONIDE 0.5 MG/2ML IN SUSP
0.5000 mg | Freq: Two times a day (BID) | RESPIRATORY_TRACT | Status: DC
  Administered 2022-07-20 – 2022-07-26 (×12): 0.5 mg via RESPIRATORY_TRACT
  Filled 2022-07-20 (×12): qty 2

## 2022-07-20 MED ORDER — SODIUM CHLORIDE 0.9% FLUSH
3.0000 mL | Freq: Two times a day (BID) | INTRAVENOUS | Status: DC
  Administered 2022-07-20 – 2022-07-26 (×12): 3 mL via INTRAVENOUS

## 2022-07-20 MED ORDER — METHYLPREDNISOLONE SODIUM SUCC 125 MG IJ SOLR
60.0000 mg | Freq: Every day | INTRAMUSCULAR | Status: DC
  Administered 2022-07-20 – 2022-07-22 (×3): 60 mg via INTRAVENOUS
  Filled 2022-07-20 (×3): qty 2

## 2022-07-20 MED ORDER — APIXABAN 2.5 MG PO TABS
2.5000 mg | ORAL_TABLET | Freq: Two times a day (BID) | ORAL | Status: DC
  Administered 2022-07-20 – 2022-07-26 (×12): 2.5 mg via ORAL
  Filled 2022-07-20 (×12): qty 1

## 2022-07-20 MED ORDER — SENNOSIDES-DOCUSATE SODIUM 8.6-50 MG PO TABS
1.0000 | ORAL_TABLET | Freq: Every evening | ORAL | Status: DC | PRN
  Administered 2022-07-23: 1 via ORAL
  Filled 2022-07-20 (×2): qty 1

## 2022-07-20 MED ORDER — SODIUM CHLORIDE 0.9 % IV SOLN
2.0000 g | INTRAVENOUS | Status: DC
  Administered 2022-07-20: 2 g via INTRAVENOUS
  Filled 2022-07-20: qty 20

## 2022-07-20 MED ORDER — FLUOXETINE HCL 20 MG PO CAPS
20.0000 mg | ORAL_CAPSULE | Freq: Every day | ORAL | Status: DC
  Administered 2022-07-21 – 2022-07-26 (×6): 20 mg via ORAL
  Filled 2022-07-20 (×6): qty 1

## 2022-07-20 NOTE — Patient Instructions (Signed)
ILD (interstitial lung disease) (HCC) Hypersensitivity pneumonitis (HCC) Cough productive of clear sputum  - progressive but still not at point needing o2 - unclear if cough is due to lungs or throast  Plan  - do not recommend anti-fibrotics due to side effect prrofile - Take prednisone 40 mg daily x 2 days, then '20mg'$  daily x 2 days, then '10mg'$  daily x 2 days, then '5mg'$  daily x 2 days and stop  - call in 1 week to let us know response - do ILD PRO visit 03/15/2022      Dysphagia, unspecified type Throat clearing  - refer ENT Dr Wilburn Cornelia (last seen in 2019) - will message Dr Hilarie Fredrickson  Follow-up - Return to see Dr. Chase Caller in 12 weeks to report progress  - symptom score at followu'   - 30 min visit

## 2022-07-20 NOTE — Telephone Encounter (Signed)
Called and spoke with pt's daughter [name] who stated that pt has become a lot weaker in past 36 hours. She stated that when she went to pt's place to get her to bring her for her appt, pt was barely coherent and even had an accident when heading to the bathroom.   [name] said that pt's symptoms were much worse to the point that pt was placed on O2 after EMS was called and she even said that pt's BP was elevated when they checked it.  Pt is being taken to [facility] via EMS.  Routing to MR as an Pharmacist, hospital. Pt's appt has been cancelled.

## 2022-07-20 NOTE — H&P (Signed)
History and Physical    Stephanie Cortez WLN:[id number] DOB: Jun 25, 1934 DOA: 07/20/2022  PCP: Marin Olp, MD  Patient coming from: Home  I have personally briefly reviewed patient's old medical records in [place]  Chief Complaint: Shortness of breath  HPI: Stephanie Cortez is a 89 y.o. female with medical history significant for ILD, permanent A-fib on Eliquis, HFpEF, HTN, hypothyroidism, LPR who presented to the ED for evaluation of dyspnea.  Patient reports about 2 weeks of progressive lethargy and exertional dyspnea.  Over the last 3 days she has had increased URI symptoms with cough, chest congestion, and increased reflux symptoms.  She has an appointment with pulmonology today however when family went to pick her up she was weak and almost incoherent therefore EMS were called.  On EMS arrival she was noted to have SpO2 77% on room air.  She was placed on NRB and brought to the ED for further evaluation.  ED Course  Labs/Imaging on admission: I have personally reviewed following labs and imaging studies.  Initial vitals showed BP 139/75, pulse 79, RR 30, temperature not recorded, SpO2 100% on 2 L O2 via [place].  Labs showed sodium 136, potassium 3.3, bicarb 27, BUN 6, creatinine 0.54, serum glucose 115, WBC 6.1, hemoglobin 11.8, platelets 322,000, BNP 155.6, procalcitonin <0.10.  RSV is positive.  COVID and influenza negative.  Blood cultures collected and pending.  Portable chest x-ray shows severe chronic interstitial lung disease with upper lobe predominance, greater on left, slightly increased from previous exam.  PCCM were consulted.  Patient was given IV Lasix 40 mg once and started on IV Solu-Medrol 60 mg daily, IV ceftriaxone, Brovana/Pulmicort, DuoNebs.  The hospitalist service was consulted to admit for further evaluation and management.  Review of Systems: All systems reviewed and are negative except as documented in history of present illness above.   Past  Medical History:  Diagnosis Date   Anemia    - Hgb 9.7gm% on 07/13/2008 in [place] -  Hgg 129gm% wiht normal irone levsl and ferritin 10/27/2008 in [place] Recurrent otitis/sinusitis   Anxiety    chronic BZ prn   Atrial fibrillation (HCC)    chronic anticoag   Bronchiectasis    >PFT 07/13/2008 in [place] 1.9L/76%, FVC 2.45L/74, Ratio 79, TLC 121%, DLCO 64%  AE BRonchiectasis - Dec 2010.New Rx:  outpatient - Feb 2011 - Rx outpatient   CHF (congestive heart failure) ([place])    COPD (chronic obstructive pulmonary disease) (HCC)    bronchiectasis   Cricopharyngeal achalasia    Depression    Diverticulosis    Dyslipidemia    Eczema    Fatty liver    GERD with stricture    Glaucoma    H. 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[place])    Ventral hernia    Vertigo    Wears glasses     Past Surgical History:  Procedure Laterality Date   BOWEL RESECTION N/A 02/08/2020   Procedure: SMALL BOWEL RESECTION;  Surgeon: Donnie Mesa, MD;  Location: [facility];  Service: General;  Laterality: N/A;   BREAST SURGERY     br bx   CARDIAC CATHETERIZATION  07/01/2013   CATARACT EXTRACTION     both   COLONOSCOPY  ESOPHAGOSCOPY W/ BOTOX INJECTION  12/11/2011   Procedure: ESOPHAGOSCOPY WITH BOTOX INJECTION;  Surgeon: Rozetta Nunnery, MD;  Location: [facility];  Service: ENT;  Laterality: N/A;  esophogoscopy with dilation, botox injection   FOOT SURGERY  03/14/2011   gastroc slide-rt   GASTROSTOMY N/A 02/08/2020   Procedure: INSERTION OF GASTROSTOMY TUBE;  Surgeon: Donnie Mesa, MD;  Location: [facility];  Service: General;  Laterality: N/A;   HIP ARTHROPLASTY Right 10/28/2019   Procedure: ARTHROPLASTY BIPOLAR HIP (HEMIARTHROPLASTY);  Surgeon: Marybelle Killings, MD;  Location: [facility];  Service: Orthopedics;  Laterality: Right;    LAPAROTOMY N/A 02/08/2020   Procedure: EXPLORATORY LAPAROTOMY;  Surgeon: Donnie Mesa, MD;  Location: [facility];  Service: General;  Laterality: N/A;   LYSIS OF ADHESION N/A 02/08/2020   Procedure: LYSIS OF ADHESIONS;  Surgeon: Donnie Mesa, MD;  Location: [facility];  Service: General;  Laterality: N/A;   TOTAL ABDOMINAL HYSTERECTOMY     TOTAL HIP REVISION Right 11/16/2019   Procedure: TOTAL HIP REVISION BIPOLAR TO CEMENTED BIPOLAR;  Surgeon: Marybelle Killings, MD;  Location: [facility];  Service: Orthopedics;  Laterality: Right;   WISDOM TOOTH EXTRACTION      Social History:  reports that she has never smoked. She has never used smokeless tobacco. She reports that she does not drink alcohol and does not use drugs.  Allergies  Allergen Reactions   Doxycycline Other (See Comments)    Rash on face and neck   Hydrocodone Other (See Comments)    Other reaction(s): hallucinations   Tramadol     Constipation, abdominal obstruction leading to weakness after using this   Vicodin [Hydrocodone-Acetaminophen] Other (See Comments)    Hallucinations    Flagyl [Metronidazole] Nausea And Vomiting   Moxifloxacin Other (See Comments)     Weakness/fatigue   Pantoprazole Sodium Rash   Penicillins Rash    Has patient had a PCN reaction causing immediate rash, facial/tongue/throat swelling, SOB or lightheadedness with hypotension:unsure Has patient had a PCN reaction causing severe rash involving mucus membranes or skin necrosis:unsure Has patient had a PCN reaction that required hospitalization:No Has patient had a PCN reaction occurring within the last 10 years:Yes Cannot recall exact reaction due to time lapse If all of the above answers are "NO", then may proceed with Cephalosporin use.  Other reaction(s): Unknown    Family History  Problem Relation Age of Onset   Hypertension Mother    Stroke Mother    Emphysema Brother    Other Father        miner's lung   Cancer Father        Lung   Colon polyps  Sister    Pancreatic cancer Sister    Kidney disease Sister    Atrial fibrillation Other        siblings     Prior to Admission medications   Medication Sig Start Date End Date Taking? Authorizing Provider  acetaminophen (TYLENOL) 325 MG tablet Take 2 tablets (650 mg total) by mouth every 6 (six) hours as needed for mild pain (or Fever >/= 101). 03/01/20   Angiulli, Lavon Paganini, PA-C  apixaban (ELIQUIS) 2.5 MG TABS tablet Take 1 tablet (2.5 mg total) by mouth 2 (two) times daily. Needs cardiology appt for refills, call office 05/16/22   Deboraha Sprang, MD  benzonatate (TESSALON) 100 MG capsule Take 1 capsule (100 mg total) by mouth 2 (two) times daily as needed for cough. 07/16/22   Marin Olp, MD  FLUoxetine (PROZAC) 20 MG capsule Take 1 capsule (20 mg total) by mouth daily. 04/16/22   Marin Olp, MD  furosemide (LASIX) 20 MG tablet Take 1 tablet (20 mg total) by mouth as needed. 06/08/22   Deboraha Sprang, MD  levothyroxine (SYNTHROID) 50 MCG tablet Take 1 tablet (50 mcg total) by mouth daily. 04/16/22   Marin Olp, MD  omeprazole (PRILOSEC) 40 MG capsule Take 1 capsule (40 mg total) by mouth 2 (two) times daily before a meal. 02/27/22   Pyrtle, Lajuan Lines, MD  Polyethylene Glycol 3350 (MIRALAX PO) Take by mouth.    [provider]  potassium chloride (KLOR-CON) 8 MEQ tablet Take 2 tablets (16 mEq total) by mouth as needed. 06/08/22   Deboraha Sprang, MD  pravastatin (PRAVACHOL) 40 MG tablet Take 1 tablet (40 mg total) by mouth at bedtime. 04/16/22   Marin Olp, MD  psyllium (METAMUCIL) 58.6 % packet Take 1 packet by mouth daily as needed (constipation).    [provider]  SYMBICORT 80-4.5 MCG/ACT inhaler INHALE 2 PUFFS INTO THE LUNGS TWICE DAILY 04/09/22   Brand Males, MD  traZODone (DESYREL) 50 MG tablet Take 1 tablet (50 mg total) by mouth at bedtime as needed. 04/16/22   Marin Olp, MD    Physical Exam: Vitals:   07/20/22 1615 07/20/22  1645 07/20/22 1700 07/20/22 1715  BP: (!) 144/81 127/62 121/65 116/71  Pulse: 84 79 69 83  Resp: (!) 33 (!) 27 (!) 25 14  SpO2: 99% 99% 99% 99%   Constitutional: Resting in bed with head elevated, appears fatigued.  NAD, calm. Eyes: EOMI, lids and conjunctivae normal ENMT: Mucous membranes are dry. Posterior pharynx clear of any exudate or lesions.Normal dentition.  Neck: normal, supple, no masses. Respiratory: Coarse rhonchi and expiratory wheezing bilaterally.  Normal respiratory effort on 2 L O2 via [place]. No accessory muscle use.  Cardiovascular: Irregularly irregular, no murmurs / rubs / gallops. No extremity edema. 2+ pedal pulses. Abdomen: no tenderness, no masses palpated.  Musculoskeletal: no clubbing / cyanosis. No joint deformity upper and lower extremities. Good ROM, no contractures. Normal muscle tone.  Skin: no rashes, lesions, ulcers. No induration Neurologic: Sensation intact. Strength 5/5 in all 4.  Psychiatric: Normal judgment and insight. Alert and oriented x 3. Normal mood.   EKG: Personally reviewed. Atrial fibrillation, rate 86, RBBB, PVC present.  Similar to prior.  Assessment/Plan Principal Problem:   Acute respiratory failure with hypoxia (HCC) Active Problems:   Permanent atrial fibrillation (HCC)   Hypothyroid   Depression   Heart failure with preserved ejection fraction (HCC)   ILD (interstitial lung disease) ([place])   Hyperlipidemia, unspecified   RSV (respiratory syncytial virus infection)   Stephanie Cortez is a 89 y.o. female with medical history significant for ILD, permanent A-fib on Eliquis, HFpEF, HTN, hypothyroidism, LPR who is admitted with acute hypoxic respiratory failure due to RSV infection in setting of ILD.  Assessment and Plan: Acute hypoxic respiratory failure due to RSV infection on background of ILD: Hypoxic with SpO2 77% on room air with EMS.  Does not require supplemental O2 at baseline, on 2 L via [place] on admit.  RSV is positive.  CXR  shows severe chronic ILD changes, upper lobe predominance, slightly increased from prior. -Pulmonology following -IV Solu-Medrol 60 mg daily -Started on Brovana/Pulmicort, DuoNebs -Continue supplemental O2 as needed -Mucinex, flutter valve -IV ceftriaxone  Permanent atrial fibrillation: Remains in atrial fibrillation, not on rate or rhythm  controlling meds as an outpatient. -Continue Eliquis 2.5 mg twice daily  HFpEF: Appears euvolemic to slightly hypovolemic on admit.  Was given IV Lasix 40 mg once in the ED, would hold further diuretics for now.  Monitor I/O's.  Hypokalemia: Oral supplement given.  Dysuria: Patient reports recent dysuria.  Check urinalysis.  Hyperlipidemia: Continue pravastatin Depression: Continue Prozac Hypothyroidism: Continue Synthroid   DVT prophylaxis: apixaban (ELIQUIS) tablet 2.5 mg Start: 07/20/22 2200 apixaban (ELIQUIS) tablet 2.5 mg   Code Status: DNR Family Communication: Son-in-law at bedside Disposition Plan: From home, dispo pending clinical progress Consults called: PCCM Severity of Illness: The appropriate patient status for this patient is INPATIENT. Inpatient status is judged to be reasonable and necessary in order to provide the required intensity of service to ensure the patient's safety. The patient's presenting symptoms, physical exam findings, and initial radiographic and laboratory data in the context of their chronic comorbidities is felt to place them at high risk for further clinical deterioration. Furthermore, it is not anticipated that the patient will be medically stable for discharge from the hospital within 2 midnights of admission.   * I certify that at the point of admission it is my clinical judgment that the patient will require inpatient hospital care spanning beyond 2 midnights from the point of admission due to high intensity of service, high risk for further deterioration and high frequency of surveillance required.Zada Finders MD [facility]  If 7PM-7AM, please contact night-coverage [email or web address]  07/20/2022, 7:48 PM

## 2022-07-20 NOTE — Telephone Encounter (Signed)
Spoke with [name] and relayed Dr. Golden Pop message. Nothing further needed at this time.

## 2022-07-20 NOTE — Hospital Course (Signed)
Stephanie Cortez is a 89 y.o. female with medical history significant for ILD, permanent A-fib on Eliquis, HFpEF, HTN, hypothyroidism, LPR who is admitted with acute hypoxic respiratory failure due to RSV infection in setting of ILD.

## 2022-07-20 NOTE — Telephone Encounter (Signed)
This message came from daughter and before the message about her going to the hospital.  However I only picked up this message now.  Please give an update to the daughter saying that at this point because she is going to the hospital these questions should be enveloped and answered by the hospital team.  We can address these questions after her discharge if they are still relevant or remaining  [id number]  Dr. Chase Caller and staff, Mom is suffering with a lot of excess phlegm and mucous. She struggles with how to deal with it. Now, she  has been coughing for about one week because she caught what was going around in our family -- sinus and coughing issues. Dr. Garret Reddish prescribed Tessalon cough pills which she started Tuesday for some relief. She has not been feeling well for about two weeks- with both of these issues combined making her weaker and she has not been eating well for 5 days.  She has had trouble with 'keeping food and some drinks down' even before the newer cough/sinus chest cold started but it is worse now.  That said, I wanted to let you know that she needs some kind of relief today while she is seeing you.    What are her options for some immediate relief, and for an overall better quality of life?   Obviously her Interstitial lung disease is worsening as expected, but the constant struggles with:  keeping food / drink down, spitting up, the clearing throat from phlegm,  and low iron levels all contribute to making her feel tired, weak and exhausted.  Could mom have a prednisone shot today?   Along with a prescription for a steroid?  An antibiotic to see if this brings her relief from the onset of this 'cold' she has now caught?  What else can be done for her quality of life now?   Mom went to see the ENT Dr. Sabino Gasser a couple of months ago for help with swallowing, and to have a barium swallow -- but he told us he could not help her with either barium swallow or throat  stretching / botox/ but referred her to a [facility] ENT which is not practical for [place] to drive there.  We have since discovered that mom's gastro Dr. Hilarie Fredrickson, has a Dr who does this procedures.  We have not reached out to them yet but plan to when mom improves from this new cold/cough.  [place] as always for your guidance with mom's health issues.   [name] xxxxxxxxxxxxxxxxxxxxx

## 2022-07-20 NOTE — ED Notes (Signed)
Pt coughing trying to bring up phlegm

## 2022-07-20 NOTE — Progress Notes (Signed)
 This encounter was created in error - please disregard.

## 2022-07-20 NOTE — Consult Note (Signed)
NAME:  Stephanie Cortez, MRN:  [id number], DOB:  09-19-1933, LOS: 0 ADMISSION DATE:  07/20/2022, CONSULTATION DATE:  07/20/22 REFERRING MD:  Alvino Chapel CHIEF COMPLAINT:  Dyspnea   History of Present Illness:  Stephanie Cortez is a 89 y.o. female who has a PMH as below including but not limited to ILD, felt to be 2/2 HSP. She is followed by Dr. Chase Caller in the office, last seen 03/15/22 and was scheduled for follow up 07/20/22. Unfortunately, when daughter went to pick her up, she was so weak and almost incoherent. EMS was ultimately called and pt was found to have sats in 56s. She was subsequently brought to [facility] ED for further evaluation.  In ED, she required 2L O2 via [place] to maintain sats in the 90s. This oxygen requirement is new for her. Her workup is still currently pending; however, given new O2 requirement and her baseline ILD, PCCM asked to see in consultation.  She had HRCT 12/12/21 and in comparison to the prior, there was only slight progression. Antifibrotics were not recommended and she was advised to continue with supportive care. On office note from 03/15/22, there is mention of hx of choking and dysphagia. SHe has been evaluated by ENT at [facility] who felt that she might have LPR. They referred her to laryngoscopy for further evaluation of possible esophageal disorders.  Pertinent  Medical History:  has A-fib ([place]); Bronchiectasis ([place]); Edema; Hypothyroid; Diverticulitis; GERD (gastroesophageal reflux disease); Depression; Orthostatic lightheadedness; Coronary artery calcification seen on CAT scan; Nodule of left lung; Heart failure with preserved ejection fraction ([place]); Upper airway cough syndrome; ILD (interstitial lung disease) ([place]); NSIP (nonspecific interstitial pneumonia) ([place]); Chronic sinusitis; Encounter for preoperative pulmonary examination; Acute bronchitis; Cough; Hoarseness; Sensorineural hearing loss (SNHL) of both ears; Tinnitus, bilateral; Closed right hip fracture, initial  encounter ([place]); Subcapital fracture of femur ([place]); Leukocytosis; Acute blood loss anemia; Chronic anticoagulation; Essential hypertension; Periprosthetic fracture around internal prosthetic hip joint, initial encounter; Periprosthetic fracture around internal prosthetic hip joint; Small bowel ischemia ([place]); Mesenteric ischemia ([place]); Debility; S/P percutaneous endoscopic gastrostomy (PEG) tube placement ([place]); Chronic pain of right knee; COVID-19 virus infection; Chronic anemia; Disorder of cervical esophageal region; Cricopharyngeal achalasia; Constipation; and Hyperlipidemia, unspecified on their problem list.  Significant Hospital Events: Including procedures, antibiotic start and stop dates in addition to other pertinent events   1/19 admit  Interim History / Subjective:    Objective:  Blood pressure 121/70, pulse 81, resp. rate (!) 22, SpO2 99 %.       No intake or output data in the 24 hours ending 07/20/22 1536 There were no vitals filed for this visit.  Examination: General appearance: 89 y.o., female, NAD, conversant  Eyes: anicteric sclerae; PERRL, tracking appropriately HENT: NCAT; MMM Neck: Trachea midline; no lymphadenopathy, no JVD Lungs: loud rhonchi, crackles, with normal respiratory effort CV: IRIR, no murmur  Abdomen: Soft, non-tender; non-distended, BS present  Extremities: RLE reddish discoloration chronic, trace edema, warm Skin: Normal turgor and texture; no rash Psych: Appropriate affect Neuro: Alert and oriented to person and place, no focal deficit     Labs/imaging personally reviewed:  CXR 1/19 > similar to priors, probably a little more extensive alveolar opacities in similar distribution  Assessment & Plan:   # Dyspnea # Hypoxia  Consider recurrent aspiration in setting of her known esophageal dysphagia, AE-ILD (chronic HP with unclear antigen), CAP, volume overload in setting PH (lasix dose decreased to prn 5-8 weeks ago). - Continue supplemental  O2 as needed  to maintain SpO2 > 90%. - Will need ambulatory desaturation study prior to d/c to assess whether she will now require home O2 needs. - Check RVP, urine legionella, urine strep - Empiric ceftriaxone, avoiding azithro in setting prolonged QTc - brovana/pulmicort, duonebs - guaifenesin, frequent flutter  - lasix IV 40 assess response - if failing to improve quickly over next couple of days consideration of CTA Chest   Best practice (evaluated daily):  Per primary.  Labs   CBC: Recent Labs  Lab 07/20/22 1430  WBC 6.1  HGB 11.8*  HCT 37.7  MCV 89.1  PLT 948    Basic Metabolic Panel: No results for input(s): "NA", "K", "CL", "CO2", "GLUCOSE", "BUN", "CREATININE", "CALCIUM", "MG", "PHOS" in the last 168 hours. GFR: CrCl cannot be calculated (Unknown ideal weight.). Recent Labs  Lab 07/20/22 1430  WBC 6.1    Liver Function Tests: No results for input(s): "AST", "ALT", "ALKPHOS", "BILITOT", "PROT", "ALBUMIN" in the last 168 hours. No results for input(s): "LIPASE", "AMYLASE" in the last 168 hours. No results for input(s): "AMMONIA" in the last 168 hours.  ABG    Component Value Date/Time   PHART 7.393 02/08/2020 1844   PCO2ART 37.3 02/08/2020 1844   PO2ART 362 (H) 02/08/2020 1844   HCO3 27.6 06/18/2021 0440   TCO2 24 02/08/2020 1844   ACIDBASEDEF 2.0 02/08/2020 1844   O2SAT 90.1 06/18/2021 0440     Coagulation Profile: No results for input(s): "INR", "PROTIME" in the last 168 hours.  Cardiac Enzymes: No results for input(s): "CKTOTAL", "CKMB", "CKMBINDEX", "TROPONINI" in the last 168 hours.  HbA1C: Hgb A1c MFr Bld  Date/Time Value Ref Range Status  07/06/2022 04:10 PM 5.7 (H) <5.7 % of total Hgb Final    Comment:    For someone without known diabetes, a hemoglobin  A1c value between 5.7% and 6.4% is consistent with prediabetes and should be confirmed with a  follow-up test. . For someone with known diabetes, a value <7% indicates that their  diabetes is well controlled. A1c targets should be individualized based on duration of diabetes, age, comorbid conditions, and other considerations. . This assay result is consistent with an increased risk of diabetes. . Currently, no consensus exists regarding use of hemoglobin A1c for diagnosis of diabetes for children. [name] Kitchen   02/09/2020 06:40 AM 5.3 4.8 - 5.6 % Final    Comment:    (NOTE) Pre diabetes:          5.7%-6.4%  Diabetes:              >6.4%  Glycemic control for   <7.0% adults with diabetes     CBG: No results for input(s): "GLUCAP" in the last 168 hours.  Review of Systems:   12 point review of systems negative except as in HPI  Past Medical History:  She,  has a past medical history of Anemia, Anxiety, Atrial fibrillation ([place]), Bronchiectasis, CHF (congestive heart failure) ([place]), COPD (chronic obstructive pulmonary disease) ([place]), Cricopharyngeal achalasia, Depression, Diverticulosis, Dyslipidemia, Eczema, Fatty liver, GERD with stricture, Glaucoma, H. pylori infection, Hypertension, Hyponatremia, Hypothyroid, IBS (irritable bowel syndrome), Lumbar disc disease, Neuropathy of both feet, Segmental colitis ([place]), Ventral hernia, Vertigo, and Wears glasses.   Surgical History:   Past Surgical History:  Procedure Laterality Date   BOWEL RESECTION N/A 02/08/2020   Procedure: SMALL BOWEL RESECTION;  Surgeon: Donnie Mesa, MD;  Location: [place];  Service: General;  Laterality: N/A;   BREAST SURGERY     br bx  CARDIAC CATHETERIZATION  07/01/2013   CATARACT EXTRACTION     both   COLONOSCOPY     ESOPHAGOSCOPY W/ BOTOX INJECTION  12/11/2011   Procedure: ESOPHAGOSCOPY WITH BOTOX INJECTION;  Surgeon: Rozetta Nunnery, MD;  Location: [facility];  Service: ENT;  Laterality: N/A;  esophogoscopy with dilation, botox injection   FOOT SURGERY  03/14/2011   gastroc slide-rt   GASTROSTOMY N/A 02/08/2020   Procedure: INSERTION OF GASTROSTOMY TUBE;  Surgeon:  Donnie Mesa, MD;  Location: [facility];  Service: General;  Laterality: N/A;   HIP ARTHROPLASTY Right 10/28/2019   Procedure: ARTHROPLASTY BIPOLAR HIP (HEMIARTHROPLASTY);  Surgeon: Marybelle Killings, MD;  Location: [facility];  Service: Orthopedics;  Laterality: Right;   LAPAROTOMY N/A 02/08/2020   Procedure: EXPLORATORY LAPAROTOMY;  Surgeon: Donnie Mesa, MD;  Location: [place];  Service: General;  Laterality: N/A;   LYSIS OF ADHESION N/A 02/08/2020   Procedure: LYSIS OF ADHESIONS;  Surgeon: Donnie Mesa, MD;  Location: [facility];  Service: General;  Laterality: N/A;   TOTAL ABDOMINAL HYSTERECTOMY     TOTAL HIP REVISION Right 11/16/2019   Procedure: TOTAL HIP REVISION BIPOLAR TO CEMENTED BIPOLAR;  Surgeon: Marybelle Killings, MD;  Location: [place] [facility];  Service: Orthopedics;  Laterality: Right;   WISDOM TOOTH EXTRACTION       Social History:   reports that she has never smoked. She has never used smokeless tobacco. She reports that she does not drink alcohol and does not use drugs.   Family History:  Her family history includes Atrial fibrillation in an other family member; Cancer in her father; Colon polyps in her sister; Emphysema in her brother; Hypertension in her mother; Kidney disease in her sister; Other in her father; Pancreatic cancer in her sister; Stroke in her mother.   Allergies Allergies  Allergen Reactions   Doxycycline Other (See Comments)    Rash on face and neck   Hydrocodone Other (See Comments)    Other reaction(s): hallucinations   Tramadol     Constipation, abdominal obstruction leading to weakness after using this   Vicodin [Hydrocodone-Acetaminophen] Other (See Comments)    Hallucinations    Flagyl [Metronidazole] Nausea And Vomiting   Moxifloxacin Other (See Comments)     Weakness/fatigue   Pantoprazole Sodium Rash   Penicillins Rash    Has patient had a PCN reaction causing immediate rash, facial/tongue/throat swelling, SOB or lightheadedness with hypotension:unsure Has patient had  a PCN reaction causing severe rash involving mucus membranes or skin necrosis:unsure Has patient had a PCN reaction that required hospitalization:No Has patient had a PCN reaction occurring within the last 10 years:Yes Cannot recall exact reaction due to time lapse If all of the above answers are "NO", then may proceed with Cephalosporin use.  Other reaction(s): Unknown     Home Medications  Prior to Admission medications   Medication Sig Start Date End Date Taking? Authorizing Provider  acetaminophen (TYLENOL) 325 MG tablet Take 2 tablets (650 mg total) by mouth every 6 (six) hours as needed for mild pain (or Fever >/= 101). 03/01/20   Angiulli, Lavon Paganini, PA-C  apixaban (ELIQUIS) 2.5 MG TABS tablet Take 1 tablet (2.5 mg total) by mouth 2 (two) times daily. Needs cardiology appt for refills, call office 05/16/22   Deboraha Sprang, MD  benzonatate (TESSALON) 100 MG capsule Take 1 capsule (100 mg total) by mouth 2 (two) times daily as needed for cough. 07/16/22   Marin Olp, MD  FLUoxetine (PROZAC) 20  MG capsule Take 1 capsule (20 mg total) by mouth daily. 04/16/22   Marin Olp, MD  furosemide (LASIX) 20 MG tablet Take 1 tablet (20 mg total) by mouth as needed. 06/08/22   Deboraha Sprang, MD  levothyroxine (SYNTHROID) 50 MCG tablet Take 1 tablet (50 mcg total) by mouth daily. 04/16/22   Marin Olp, MD  omeprazole (PRILOSEC) 40 MG capsule Take 1 capsule (40 mg total) by mouth 2 (two) times daily before a meal. 02/27/22   Pyrtle, Lajuan Lines, MD  Polyethylene Glycol 3350 (MIRALAX PO) Take by mouth.    [provider]  potassium chloride (KLOR-CON) 8 MEQ tablet Take 2 tablets (16 mEq total) by mouth as needed. 06/08/22   Deboraha Sprang, MD  pravastatin (PRAVACHOL) 40 MG tablet Take 1 tablet (40 mg total) by mouth at bedtime. 04/16/22   Marin Olp, MD  psyllium (METAMUCIL) 58.6 % packet Take 1 packet by mouth daily as needed (constipation).    [provider]   SYMBICORT 80-4.5 MCG/ACT inhaler INHALE 2 PUFFS INTO THE LUNGS TWICE DAILY 04/09/22   Brand Males, MD  traZODone (DESYREL) 50 MG tablet Take 1 tablet (50 mg total) by mouth at bedtime as needed. 04/16/22   Marin Olp, MD     Critical care time: na

## 2022-07-20 NOTE — ED Notes (Signed)
ED TO INPATIENT HANDOFF REPORT  ED Nurse Name and Phone #: Caryl Pina RN [phone]  S Name/Age/Gender Stephanie Cortez 89 y.o. female Room/Bed: 027C/027C  Code Status   Code Status: DNR  Home/SNF/Other Home Patient oriented to: self, place, time, and situation Is this baseline? Yes   Triage Complete: Triage complete  Chief Complaint Acute respiratory failure with hypoxia ([place]) [J96.01]  Triage Note PT BIB [facility] for SOB/weaknes.   PT has hx of interstitial lung disease but has had URI symptoms the last 3 days.  She was supposed to see her pulmonologist today but was too weak and SOB.  Fire found her to be at 77% RA.  Came up to 98% on NRB.  PT does not wear 02 at baseline.  EMS endorsed that lung sounds are wet and that she produces a lot of phlegm at baseline.  Pt is in afib and that is her baseline  Pt arrived on NRB.  Now on 2L 99%   Allergies Allergies  Allergen Reactions   Doxycycline Other (See Comments)    Rash on face and neck   Tramadol     Constipation, abdominal obstruction leading to weakness after using this   Vicodin [Hydrocodone-Acetaminophen] Other (See Comments)    Hallucinations    Flagyl [Metronidazole] Nausea And Vomiting   Moxifloxacin Other (See Comments)     Weakness/fatigue   Pantoprazole Sodium Rash   Penicillins Rash    Has patient had a PCN reaction causing immediate rash, facial/tongue/throat swelling, SOB or lightheadedness with hypotension:unsure Has patient had a PCN reaction causing severe rash involving mucus membranes or skin necrosis:unsure Has patient had a PCN reaction that required hospitalization:No Has patient had a PCN reaction occurring within the last 10 years:Yes Cannot recall exact reaction due to time lapse If all of the above answers are "NO", then may proceed with Cephalosporin use.  Other reaction(s): Unknown    Level of Care/Admitting Diagnosis ED Disposition     ED Disposition  Admit   Condition  --   Comment   Hospital Area: [place] [[id number]]  Level of Care: Telemetry Cardiac [[id number]]  May admit patient to [facility] or [facility] if equivalent level of care is available:: No  Covid Evaluation: Confirmed COVID Negative  Diagnosis: Acute respiratory failure with hypoxia [facility]) [[id number]]  Admitting Physician: Lenore Cordia [[id number]]  Attending Physician: Lenore Cordia [[id number]]  Certification:: I certify this patient will need inpatient services for at least 2 midnights  Estimated Length of Stay: 2          B Medical/Surgery History Past Medical History:  Diagnosis Date   Anemia    - Hgb 9.7gm% on 07/13/2008 in [place] 129gm% wiht normal irone levsl and ferritin 10/27/2008 in [place] Recurrent otitis/sinusitis   Anxiety    chronic BZ prn   Atrial fibrillation ([place])    chronic anticoag   Bronchiectasis    >PFT 07/13/2008 in [place] 1.9L/76%, FVC 2.45L/74, Ratio 79, TLC 121%, DLCO 64%  AE BRonchiectasis - Dec 2010.New Rx:  outpatient - Feb 2011 - Rx outpatient   CHF (congestive heart failure) (HCC)    COPD (chronic obstructive pulmonary disease) (HCC)    bronchiectasis   Cricopharyngeal achalasia    Depression    Diverticulosis    Dyslipidemia    Eczema    Fatty liver    GERD with stricture    Glaucoma    H. pylori infection    Hypertension  Hyponatremia    chronic, s/p endo eval 06/2012   Hypothyroid    IBS (irritable bowel syndrome)    Lumbar disc disease    Neuropathy of both feet    Segmental colitis ([place])    Ventral hernia    Vertigo    Wears glasses    Past Surgical History:  Procedure Laterality Date   BOWEL RESECTION N/A 02/08/2020   Procedure: SMALL BOWEL RESECTION;  Surgeon: Donnie Mesa, MD;  Location: [place];  Service: General;  Laterality: N/A;   BREAST SURGERY     br bx   CARDIAC CATHETERIZATION  07/01/2013   CATARACT EXTRACTION     both   COLONOSCOPY     ESOPHAGOSCOPY W/ BOTOX INJECTION  12/11/2011   Procedure:  ESOPHAGOSCOPY WITH BOTOX INJECTION;  Surgeon: Rozetta Nunnery, MD;  Location: [place];  Service: ENT;  Laterality: N/A;  esophogoscopy with dilation, botox injection   FOOT SURGERY  03/14/2011   gastroc slide-rt   GASTROSTOMY N/A 02/08/2020   Procedure: INSERTION OF GASTROSTOMY TUBE;  Surgeon: Donnie Mesa, MD;  Location: [place];  Service: General;  Laterality: N/A;   HIP ARTHROPLASTY Right 10/28/2019   Procedure: ARTHROPLASTY BIPOLAR HIP (HEMIARTHROPLASTY);  Surgeon: Marybelle Killings, MD;  Location: [facility];  Service: Orthopedics;  Laterality: Right;   LAPAROTOMY N/A 02/08/2020   Procedure: EXPLORATORY LAPAROTOMY;  Surgeon: Donnie Mesa, MD;  Location: [place];  Service: General;  Laterality: N/A;   LYSIS OF ADHESION N/A 02/08/2020   Procedure: LYSIS OF ADHESIONS;  Surgeon: Donnie Mesa, MD;  Location: [place];  Service: General;  Laterality: N/A;   TOTAL ABDOMINAL HYSTERECTOMY     TOTAL HIP REVISION Right 11/16/2019   Procedure: TOTAL HIP REVISION BIPOLAR TO CEMENTED BIPOLAR;  Surgeon: Marybelle Killings, MD;  Location: [place];  Service: Orthopedics;  Laterality: Right;   WISDOM TOOTH EXTRACTION       A IV Location/Drains/Wounds Patient Lines/Drains/Airways Status     Active Line/Drains/Airways     Name Placement date Placement time Site Days   Peripheral IV 07/20/22 20 G Posterior;Right Forearm 07/20/22  1440  Forearm  less than 1   Gastrostomy/Enterostomy Gastrostomy 18 Fr. LUQ 02/08/20  1850  LUQ  893   Incision (Closed) 02/08/20 Abdomen Other (Comment) 02/08/20  1917  -- 893   Pressure Injury 06/18/21 Sacrum Mid Stage 1 -  Intact skin with non-blanchable redness of a localized area usually over a bony prominence. red 06/18/21  2100  -- 397   Pressure Injury 06/18/21 Heel Left Stage 1 -  Intact skin with non-blanchable redness of a localized area usually over a bony prominence. 06/18/21  2100  -- 397   Pressure Injury 06/18/21 Heel Right Stage 1 -  Intact skin with  non-blanchable redness of a localized area usually over a bony prominence. 06/18/21  2100  -- 397            Intake/Output Last 24 hours No intake or output data in the 24 hours ending 07/20/22 2116  Labs/Imaging Results for orders placed or performed during the hospital encounter of 07/20/22 (from the past 48 hour(s))  Basic metabolic panel     Status: Abnormal   Collection Time: 07/20/22  2:30 PM  Result Value Ref Range   Sodium 136 135 - 145 mmol/L   Potassium 3.3 (L) 3.5 - 5.1 mmol/L   Chloride 101 98 - 111 mmol/L   CO2 27 22 - 32 mmol/L   Glucose, Bld 115 (  H) 70 - 99 mg/dL    Comment: Glucose reference range applies only to samples taken after fasting for at least 8 hours.   BUN 6 (L) 8 - 23 mg/dL   Creatinine, Ser 0.54 0.44 - 1.00 mg/dL   Calcium 9.0 8.9 - 10.3 mg/dL   GFR, Estimated >60 >60 mL/min    Comment: (NOTE) Calculated using the CKD-EPI Creatinine Equation (2021)    Anion gap 8 5 - 15    Comment: Performed at [facility] [address] [place], [place] [address]  CBC     Status: Abnormal   Collection Time: 07/20/22  2:30 PM  Result Value Ref Range   WBC 6.1 4.0 - 10.5 K/uL   RBC 4.23 3.87 - 5.11 MIL/uL   Hemoglobin 11.8 (L) 12.0 - 15.0 g/dL   HCT 37.7 36.0 - 46.0 %   MCV 89.1 80.0 - 100.0 fL   MCH 27.9 26.0 - 34.0 pg   MCHC 31.3 30.0 - 36.0 g/dL   RDW 14.8 11.5 - 15.5 %   Platelets 322 150 - 400 K/uL   nRBC 0.0 0.0 - 0.2 %    Comment: Performed at [facility], [place] [address], [place], [place] [address]  Brain natriuretic peptide     Status: Abnormal   Collection Time: 07/20/22  2:30 PM  Result Value Ref Range   B Natriuretic Peptide 155.6 (H) 0.0 - 100.0 pg/mL    Comment: Performed at [facility] [address] [place], [place] [address]  Procalcitonin - Baseline     Status: None   Collection Time: 07/20/22  2:30 PM  Result Value Ref Range   Procalcitonin <0.10 ng/mL    Comment:        Interpretation: PCT (Procalcitonin)  <= 0.5 ng/mL: Systemic infection (sepsis) is not likely. Local bacterial infection is possible. (NOTE)       Sepsis PCT Algorithm           Lower Respiratory Tract                                      Infection PCT Algorithm    ----------------------------     ----------------------------         PCT < 0.25 ng/mL                PCT < 0.10 ng/mL          Strongly encourage             Strongly discourage   discontinuation of antibiotics    initiation of antibiotics    ----------------------------     -----------------------------       PCT 0.25 - 0.50 ng/mL            PCT 0.10 - 0.25 ng/mL               OR       >80% decrease in PCT            Discourage initiation of                                            antibiotics      Encourage discontinuation           of antibiotics    ----------------------------     -----------------------------  PCT >= 0.50 ng/mL              PCT 0.26 - 0.50 ng/mL               AND        <80% decrease in PCT             Encourage initiation of                                             antibiotics       Encourage continuation           of antibiotics    ----------------------------     -----------------------------        PCT >= 0.50 ng/mL                  PCT > 0.50 ng/mL               AND         increase in PCT                  Strongly encourage                                      initiation of antibiotics    Strongly encourage escalation           of antibiotics                                     -----------------------------                                           PCT <= 0.25 ng/mL                                                 OR                                        > 80% decrease in PCT                                      Discontinue / Do not initiate                                             antibiotics  Performed at [facility], [address], [place], [place] [address]   Resp panel by RT-PCR (RSV, Flu A&B,  Covid) Anterior Nasal Swab     Status: Abnormal   Collection Time: 07/20/22  3:04 PM   Specimen: Anterior Nasal Swab  Result Value Ref Range   SARS Coronavirus 2 by RT PCR NEGATIVE NEGATIVE  Comment: (NOTE) SARS-CoV-2 target nucleic acids are NOT DETECTED.  The SARS-CoV-2 RNA is generally detectable in upper respiratory specimens during the acute phase of infection. The lowest concentration of SARS-CoV-2 viral copies this assay can detect is 138 copies/mL. A negative result does not preclude SARS-Cov-2 infection and should not be used as the sole basis for treatment or other patient management decisions. A negative result may occur with  improper specimen collection/handling, submission of specimen other than nasopharyngeal swab, presence of viral mutation(s) within the areas targeted by this assay, and inadequate number of viral copies(<138 copies/mL). A negative result must be combined with clinical observations, patient history, and epidemiological information. The expected result is Negative.  Fact Sheet for Patients:  EntrepreneurPulse.com.au  Fact Sheet for Healthcare Providers:  IncredibleEmployment.be  This test is no t yet approved or cleared by the [place] FDA and  has been authorized for detection and/or diagnosis of SARS-CoV-2 by FDA under an Emergency Use Authorization (EUA). This EUA will remain  in effect (meaning this test can be used) for the duration of the COVID-19 declaration under Section 564(b)(1) of the Act, 21 U.S.C.section 360bbb-3(b)(1), unless the authorization is terminated  or revoked sooner.       Influenza A by PCR NEGATIVE NEGATIVE   Influenza B by PCR NEGATIVE NEGATIVE    Comment: (NOTE) The Xpert Xpress SARS-CoV-2/FLU/RSV plus assay is intended as an aid in the diagnosis of influenza from Nasopharyngeal swab specimens and should not be used as a sole basis for treatment. Nasal washings  and aspirates are unacceptable for Xpert Xpress SARS-CoV-2/FLU/RSV testing.  Fact Sheet for Patients: EntrepreneurPulse.com.au  Fact Sheet for Healthcare Providers: IncredibleEmployment.be  This test is not yet approved or cleared by the [place] FDA and has been authorized for detection and/or diagnosis of SARS-CoV-2 by FDA under an Emergency Use Authorization (EUA). This EUA will remain in effect (meaning this test can be used) for the duration of the COVID-19 declaration under Section 564(b)(1) of the Act, 21 U.S.C. section 360bbb-3(b)(1), unless the authorization is terminated or revoked.     Resp Syncytial Virus by PCR POSITIVE (A) NEGATIVE    Comment: (NOTE) Fact Sheet for Patients: EntrepreneurPulse.com.au  Fact Sheet for Healthcare Providers: IncredibleEmployment.be  This test is not yet approved or cleared by the [place] FDA and has been authorized for detection and/or diagnosis of SARS-CoV-2 by FDA under an Emergency Use Authorization (EUA). This EUA will remain in effect (meaning this test can be used) for the duration of the COVID-19 declaration under Section 564(b)(1) of the Act, 21 U.S.C. section 360bbb-3(b)(1), unless the authorization is terminated or revoked.  Performed at [facility], [place] [address], [place], [place] [address]   Urinalysis, Routine w reflex microscopic     Status: Abnormal   Collection Time: 07/20/22  8:33 PM  Result Value Ref Range   Color, Urine STRAW (A) YELLOW   APPearance CLEAR CLEAR   Specific Gravity, Urine 1.005 1.005 - 1.030   pH 6.0 5.0 - 8.0   Glucose, UA NEGATIVE NEGATIVE mg/dL   Hgb urine dipstick NEGATIVE NEGATIVE   Bilirubin Urine NEGATIVE NEGATIVE   Ketones, ur NEGATIVE NEGATIVE mg/dL   Protein, ur NEGATIVE NEGATIVE mg/dL   Nitrite NEGATIVE NEGATIVE   Leukocytes,Ua NEGATIVE NEGATIVE    Comment: Performed at [facility] [address], [place], [place] [address]   *Note: Due to a large number of results and/or encounters for the requested time period, some results  have not been displayed. A complete set of results can be found in Results Review.   DG Chest Portable 1 View  Result Date: 07/20/2022 CLINICAL DATA:  Shortness of breath for a few weeks getting worse EXAM: PORTABLE CHEST 1 VIEW COMPARISON:  Portable exam 1535 hours compared to CT chest 12/12/2021 and chest radiograph 06/17/2021 FINDINGS: Enlargement of cardiac silhouette. Atherosclerotic calcification aorta. Severe diffuse interstitial infiltrates with upper lobe predominance, slightly increased from prior CT. No definite pleural effusion or pneumothorax. Bones demineralized with old posttraumatic deformity proximal LEFT humerus. IMPRESSION: Severe chronic interstitial lung disease with upper lobe predominance, greater on LEFT, slightly increased from previous exam. Aortic Atherosclerosis (ICD10-I70.0). Electronically Signed   By: Lavonia Dana M.D.   On: 07/20/2022 15:52    Pending Labs Unresulted Labs (From admission, onward)     Start     Ordered   07/21/22 3664  Basic metabolic panel  Tomorrow morning,   R        07/20/22 1939   07/21/22 0500  CBC  Tomorrow morning,   R        07/20/22 1939   07/21/22 0500  Magnesium  Tomorrow morning,   R        07/20/22 1939   07/20/22 1653  Culture, blood (Routine X 2) w Reflex to ID Panel  BLOOD CULTURE X 2,   R (with STAT occurrences)      07/20/22 1652   07/20/22 1650  Expectorated Sputum Assessment w Gram Stain, Rflx to Resp Cult  Once,   R        07/20/22 1649   07/20/22 1650  Acid Fast Smear (AFB)  (AFB smear + Culture w reflexed sensitivities panel)  Once,   URGENT       See Hyperspace for full Linked Orders Report.   07/20/22 1649   07/20/22 1650  Acid Fast Culture with reflexed sensitivities  (AFB smear + Culture w reflexed sensitivities panel)  Once,   URGENT       See Hyperspace for full Linked  Orders Report.   07/20/22 1649   07/20/22 1618  Legionella Pneumophila Serogp 1 Ur Ag  Once,   URGENT        07/20/22 1617   07/20/22 1618  Strep pneumoniae urinary antigen  Once,   URGENT        07/20/22 1617   07/20/22 1547  Respiratory (~20 pathogens) panel by PCR  (Respiratory panel by PCR (~20 pathogens, ~24 hr TAT)  w precautions)  Once,   URGENT        07/20/22 1546            Vitals/Pain Today's Vitals   07/20/22 1958 07/20/22 2002 07/20/22 2032 07/20/22 2100  BP:    117/65  Pulse:    88  Resp:    (!) 24  Temp:   (!) 97.4 F (36.3 C)   TempSrc:   Axillary   SpO2: 95% 95%  99%  PainSc:        Isolation Precautions Droplet precaution  Medications Medications  arformoterol (BROVANA) nebulizer solution 15 mcg (15 mcg Nebulization Given 07/20/22 1958)  budesonide (PULMICORT) nebulizer solution 0.5 mg (0.5 mg Nebulization Given 07/20/22 2002)  ipratropium-albuterol (DUONEB) 0.5-2.5 (3) MG/3ML nebulizer solution 3 mL (3 mLs Nebulization Given 07/20/22 1954)  cefTRIAXone (ROCEPHIN) 2 g in sodium chloride 0.9 % 100 mL IVPB (0 g Intravenous Stopped 07/20/22 1916)  guaiFENesin (MUCINEX) 12 hr tablet 600 mg (has no administration in time  range)  methylPREDNISolone sodium succinate (SOLU-MEDROL) 125 mg/2 mL injection 60 mg (60 mg Intravenous Given 07/20/22 1759)  sodium chloride flush (NS) 0.9 % injection 3 mL (has no administration in time range)  acetaminophen (TYLENOL) tablet 650 mg (has no administration in time range)    Or  acetaminophen (TYLENOL) suppository 650 mg (has no administration in time range)  prochlorperazine (COMPAZINE) injection 10 mg (has no administration in time range)  senna-docusate (Senokot-S) tablet 1 tablet (has no administration in time range)  apixaban (ELIQUIS) tablet 2.5 mg (has no administration in time range)  FLUoxetine (PROZAC) capsule 20 mg (has no administration in time range)  levothyroxine (SYNTHROID) tablet 50 mcg (has no administration in  time range)  pravastatin (PRAVACHOL) tablet 40 mg (has no administration in time range)  potassium chloride SA (KLOR-CON M) CR tablet 40 mEq (40 mEq Oral Given 07/20/22 1823)  furosemide (LASIX) injection 40 mg (40 mg Intravenous Given 07/20/22 1759)    Mobility walks with person assist     Focused Assessments Pulmonary Assessment Handoff:  Lung sounds: Bilateral Breath Sounds: Diminished, Inspiratory wheezes L Breath Sounds: Rales R Breath Sounds: Rales O2 Device: Nasal Cannula O2 Flow Rate (L/min): 2 L/min    R Recommendations: See Admitting Provider Note  Report given to:   Additional Notes:

## 2022-07-20 NOTE — ED Triage Notes (Signed)
PT BIB [facility] for SOB/weaknes.   PT has hx of interstitial lung disease but has had URI symptoms the last 3 days.  She was supposed to see her pulmonologist today but was too weak and SOB.  Fire found her to be at 77% RA.  Came up to 98% on NRB.  PT does not wear 02 at baseline.  EMS endorsed that lung sounds are wet and that she produces a lot of phlegm at baseline.  Pt is in afib and that is her baseline  Pt arrived on NRB.  Now on 2L 99%

## 2022-07-20 NOTE — ED Provider Notes (Signed)
[facility] Provider Note   CSN: [id number] Arrival date & time: 07/20/22  1422     History  Chief Complaint  Patient presents with   Shortness of Breath    Weakness     Stephanie Cortez is a 89 y.o. female.   Shortness of Breath Patient presents with shortness of breath.  Has had worsening for around the last week.  Last 3 days worse.  Has been more cough than usual.  Some mild change in sputum.  History of interstitial lung disease.  Was due to be seen at pulmonology today but was too short of breath and able to do it.  Reportedly had sats of 77% on room air at home.  Now on nasal cannula with improved saturations.  In A-fib which is chronic for her.  No swelling in her legs.  No known sick contacts.    Past Medical History:  Diagnosis Date   Anemia    - Hgb 9.7gm% on 07/13/2008 in [place] -  Hgg 129gm% wiht normal irone levsl and ferritin 10/27/2008 in [place] Recurrent otitis/sinusitis   Anxiety    chronic BZ prn   Atrial fibrillation (HCC)    chronic anticoag   Bronchiectasis    >PFT 07/13/2008 in [place] 1.9L/76%, FVC 2.45L/74, Ratio 79, TLC 121%, DLCO 64%  AE BRonchiectasis - Dec 2010.New Rx:  outpatient - Feb 2011 - Rx outpatient   CHF (congestive heart failure) (HCC)    COPD (chronic obstructive pulmonary disease) (HCC)    bronchiectasis   Cricopharyngeal achalasia    Depression    Diverticulosis    Dyslipidemia    Eczema    Fatty liver    GERD with stricture    Glaucoma    H. 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HCC)    Ventral hernia    Vertigo    Wears glasses     Home Medications Prior to Admission medications   Medication Sig Start Date End Date Taking? Authorizing Provider  acetaminophen (TYLENOL) 325 MG tablet Take 2 tablets (650 mg  total) by mouth every 6 (six) hours as needed for mild pain (or Fever >/= 101). 03/01/20   Angiulli, Lavon Paganini, PA-C  apixaban (ELIQUIS) 2.5 MG TABS tablet Take 1 tablet (2.5 mg total) by mouth 2 (two) times daily. Needs cardiology appt for refills, call office 05/16/22   Deboraha Sprang, MD  benzonatate (TESSALON) 100 MG capsule Take 1 capsule (100 mg total) by mouth 2 (two) times daily as needed for cough. 07/16/22   Marin Olp, MD  FLUoxetine (PROZAC) 20 MG capsule Take 1 capsule (20 mg total) by mouth daily. 04/16/22   Marin Olp, MD  furosemide (LASIX) 20 MG tablet Take 1 tablet (20 mg total) by mouth as needed. 06/08/22   Deboraha Sprang, MD  levothyroxine (SYNTHROID) 50 MCG tablet Take 1 tablet (50 mcg total) by mouth daily. 04/16/22   Marin Olp, MD  omeprazole (PRILOSEC) 40 MG capsule Take 1 capsule (40 mg total) by mouth 2 (two) times daily before a meal. 02/27/22   Pyrtle, Lajuan Lines, MD  Polyethylene Glycol 3350 (MIRALAX PO) Take by mouth.    [provider]  potassium chloride (KLOR-CON)  8 MEQ tablet Take 2 tablets (16 mEq total) by mouth as needed. 06/08/22   Deboraha Sprang, MD  pravastatin (PRAVACHOL) 40 MG tablet Take 1 tablet (40 mg total) by mouth at bedtime. 04/16/22   Marin Olp, MD  psyllium (METAMUCIL) 58.6 % packet Take 1 packet by mouth daily as needed (constipation).    [provider]  SYMBICORT 80-4.5 MCG/ACT inhaler INHALE 2 PUFFS INTO THE LUNGS TWICE DAILY 04/09/22   Brand Males, MD  traZODone (DESYREL) 50 MG tablet Take 1 tablet (50 mg total) by mouth at bedtime as needed. 04/16/22   Marin Olp, MD      Allergies    Doxycycline, Hydrocodone, Tramadol, Vicodin [hydrocodone-acetaminophen], Flagyl [metronidazole], Moxifloxacin, Pantoprazole sodium, and Penicillins    Review of Systems   Review of Systems  Respiratory:  Positive for shortness of breath.     Physical Exam Updated Vital Signs BP (!) 144/81   Pulse 84    Resp (!) 33   LMP  (LMP Unknown)   SpO2 99%  Physical Exam Vitals and nursing note reviewed.  Cardiovascular:     Rate and Rhythm: Normal rate. Rhythm irregular.  Pulmonary:     Comments: Diffuse harsh breath sounds with tachypnea.  Some dyspnea. Chest:     Chest wall: No tenderness.  Abdominal:     Tenderness: There is no abdominal tenderness.  Musculoskeletal:     Right lower leg: No edema.     Left lower leg: No edema.  Skin:    General: Skin is warm.  Neurological:     Mental Status: She is alert and oriented to person, place, and time.     ED Results / Procedures / Treatments   Labs (all labs ordered are listed, but only abnormal results are displayed) Labs Reviewed  RESP PANEL BY RT-PCR (RSV, FLU A&B, COVID)  RVPGX2 - Abnormal; Notable for the following components:      Result Value   Resp Syncytial Virus by PCR POSITIVE (*)    All other components within normal limits  BASIC METABOLIC PANEL - Abnormal; Notable for the following components:   Potassium 3.3 (*)    Glucose, Bld 115 (*)    BUN 6 (*)    All other components within normal limits  CBC - Abnormal; Notable for the following components:   Hemoglobin 11.8 (*)    All other components within normal limits  RESPIRATORY PANEL BY PCR  EXPECTORATED SPUTUM ASSESSMENT W GRAM STAIN, RFLX TO RESP C  ACID FAST SMEAR (AFB, MYCOBACTERIA)  ACID FAST CULTURE WITH REFLEXED SENSITIVITIES (MYCOBACTERIA)  CULTURE, BLOOD (ROUTINE X 2)  CULTURE, BLOOD (ROUTINE X 2)  BRAIN NATRIURETIC PEPTIDE  LEGIONELLA PNEUMOPHILA SEROGP 1 UR AG  STREP PNEUMONIAE URINARY ANTIGEN  PROCALCITONIN    EKG None  Radiology DG Chest Portable 1 View  Result Date: 07/20/2022 CLINICAL DATA:  Shortness of breath for a few weeks getting worse EXAM: PORTABLE CHEST 1 VIEW COMPARISON:  Portable exam 1535 hours compared to CT chest 12/12/2021 and chest radiograph 06/17/2021 FINDINGS: Enlargement of cardiac silhouette. Atherosclerotic calcification  aorta. Severe diffuse interstitial infiltrates with upper lobe predominance, slightly increased from prior CT. No definite pleural effusion or pneumothorax. Bones demineralized with old posttraumatic deformity proximal LEFT humerus. IMPRESSION: Severe chronic interstitial lung disease with upper lobe predominance, greater on LEFT, slightly increased from previous exam. Aortic Atherosclerosis (ICD10-I70.0). Electronically Signed   By: Lavonia Dana M.D.   On: 07/20/2022 15:52    Procedures Procedures  Medications Ordered in ED Medications  arformoterol (BROVANA) nebulizer solution 15 mcg (has no administration in time range)  budesonide (PULMICORT) nebulizer solution 0.5 mg (has no administration in time range)  ipratropium-albuterol (DUONEB) 0.5-2.5 (3) MG/3ML nebulizer solution 3 mL (has no administration in time range)  cefTRIAXone (ROCEPHIN) 2 g in sodium chloride 0.9 % 100 mL IVPB (has no administration in time range)  guaiFENesin (MUCINEX) 12 hr tablet 600 mg (has no administration in time range)  potassium chloride SA (KLOR-CON M) CR tablet 40 mEq (has no administration in time range)  furosemide (LASIX) injection 40 mg (has no administration in time range)  methylPREDNISolone sodium succinate (SOLU-MEDROL) 125 mg/2 mL injection 60 mg (has no administration in time range)    ED Course/ Medical Decision Making/ A&P                             Medical Decision Making Amount and/or Complexity of Data Reviewed Radiology: ordered.   Patient presents with shortness of breath and cough.  History of interstitial lung disease.  Has previously been on oxygen at times.  Differential diagnosis includes pneumonia, viral infection, interstitial lung disease, CHF.  Will get x-ray and basic blood work.  Will get viral testing.  Discussed with pulmonology for consult as an inpatient.  X-ray independently interpreted does show some worsening of chronic changes.  Has positive RSV testing.  Pulmonary  consulting but note not complete yet.  Will admit to hospitalist. History of chronic A-fib and heart rate will occasionally go down into the 30s.        Final Clinical Impression(s) / ED Diagnoses Final diagnoses:  RSV (respiratory syncytial virus infection)  Hypoxia    Rx / DC Orders ED Discharge Orders     None         Davonna Belling, MD 07/20/22 1713

## 2022-07-20 NOTE — Telephone Encounter (Signed)
MR, please see mychart message sent by pt's daughter as well as attached media.

## 2022-07-20 NOTE — Telephone Encounter (Signed)
Sorry to hear that.  Please let the daughter know to keep [place] posted.  We are wishing her the best speedy recovery

## 2022-07-21 ENCOUNTER — Encounter: Payer: Self-pay | Admitting: Family Medicine

## 2022-07-21 DIAGNOSIS — Z515 Encounter for palliative care: Secondary | ICD-10-CM

## 2022-07-21 DIAGNOSIS — B338 Other specified viral diseases: Secondary | ICD-10-CM | POA: Diagnosis not present

## 2022-07-21 DIAGNOSIS — Z66 Do not resuscitate: Secondary | ICD-10-CM

## 2022-07-21 DIAGNOSIS — J849 Interstitial pulmonary disease, unspecified: Secondary | ICD-10-CM | POA: Diagnosis not present

## 2022-07-21 DIAGNOSIS — J9601 Acute respiratory failure with hypoxia: Secondary | ICD-10-CM

## 2022-07-21 DIAGNOSIS — I5032 Chronic diastolic (congestive) heart failure: Secondary | ICD-10-CM

## 2022-07-21 LAB — STREP PNEUMONIAE URINARY ANTIGEN: Strep Pneumo Urinary Antigen: NEGATIVE

## 2022-07-21 LAB — BASIC METABOLIC PANEL
Anion gap: 12 (ref 5–15)
BUN: 6 mg/dL — ABNORMAL LOW (ref 8–23)
CO2: 27 mmol/L (ref 22–32)
Calcium: 8.7 mg/dL — ABNORMAL LOW (ref 8.9–10.3)
Chloride: 99 mmol/L (ref 98–111)
Creatinine, Ser: 0.63 mg/dL (ref 0.44–1.00)
GFR, Estimated: >60 mL/min (ref >60)
Glucose, Bld: 139 mg/dL — ABNORMAL HIGH (ref 70–99)
Potassium: 4.1 mmol/L (ref 3.5–5.1)
Sodium: 138 mmol/L (ref 135–145)

## 2022-07-21 LAB — CBC
HCT: 34.2 % — ABNORMAL LOW (ref 36.0–46.0)
Hemoglobin: 10.9 g/dL — ABNORMAL LOW (ref 12.0–15.0)
MCH: 28.2 pg (ref 26.0–34.0)
MCHC: 31.9 g/dL (ref 30.0–36.0)
MCV: 88.4 fL (ref 80.0–100.0)
Platelets: 319 10*3/uL (ref 150–400)
RBC: 3.87 MIL/uL (ref 3.87–5.11)
RDW: 14.9 % (ref 11.5–15.5)
WBC: 3.9 10*3/uL — ABNORMAL LOW (ref 4.0–10.5)
nRBC: 0 % (ref 0.0–0.2)

## 2022-07-21 LAB — MAGNESIUM: Magnesium: 1.9 mg/dL (ref 1.7–2.4)

## 2022-07-21 MED ORDER — TRAZODONE HCL 50 MG PO TABS
50.0000 mg | ORAL_TABLET | Freq: Every evening | ORAL | Status: DC | PRN
  Administered 2022-07-22 – 2022-07-23 (×2): 50 mg via ORAL
  Filled 2022-07-21 (×2): qty 1

## 2022-07-21 MED ORDER — IPRATROPIUM-ALBUTEROL 0.5-2.5 (3) MG/3ML IN SOLN
3.0000 mL | Freq: Three times a day (TID) | RESPIRATORY_TRACT | Status: DC
  Administered 2022-07-22 (×2): 3 mL via RESPIRATORY_TRACT
  Filled 2022-07-21: qty 3

## 2022-07-21 MED ORDER — FUROSEMIDE 10 MG/ML IJ SOLN
40.0000 mg | Freq: Once | INTRAMUSCULAR | Status: AC
  Administered 2022-07-21: 40 mg via INTRAVENOUS
  Filled 2022-07-21: qty 4

## 2022-07-21 MED ORDER — ENSURE ENLIVE PO LIQD
237.0000 mL | Freq: Two times a day (BID) | ORAL | Status: DC
  Administered 2022-07-22 – 2022-07-26 (×3): 237 mL via ORAL

## 2022-07-21 NOTE — Progress Notes (Signed)
PROGRESS NOTE    Stephanie Cortez  GYK:[id number] DOB: 1933/10/13 DOA: 07/20/2022 PCP: Marin Olp, MD    Brief Narrative:  89 y.o. female with medical history significant for ILD, permanent A-fib on Eliquis, HFpEF, HTN, hypothyroidism who presented to the ED for evaluation of dyspnea. Patient does have ongoing symptoms for about more than 3 months now with progressive lethargy and exertional dyspnea.  Symptoms worse.  2 weeks.  Use different cough medications as outpatient without much improvement.  She was supposed to be seen at pulmonary office but at home she was found almost incoherent so EMS was called.  She was found 77% on room air by EMS.  Put on nonrebreather and brought to the ER. In the emergency room, able to wean off on 2 to 3 L of oxygen.  Electrolytes are stable.  Chest x-ray showed severe chronic interstitial lung disease with upper lobe predominance slightly increased from previous exam.  Also seen by critical care.  Admitted.  Respiratory virus panel was positive for RSV.   Assessment & Plan:   Acute exacerbation of chronic interstitial lung disease, acute hypoxemic respiratory failure. Acute RSV infection Suspect aspiration pneumonia, worsening interstitial lung disease:  Aggressive bronchodilator therapy, IV steroids, inhalational steroids, scheduled and as needed bronchodilators, deep breathing exercises, incentive spirometry, chest physiotherapy.  Phytex. Sputum cultures, Legionella and streptococcal antigen.  Blood cultures. Antibiotics due to severity of symptoms.  Started on Rocephin. Supplemental oxygen to keep saturations more than 90%. PT/OT/speech.  Followed by pulmonary.   Chronic medical issues including Chronic A-fib, stable.  Therapeutic on Eliquis.  Not on any rate control medications. Heart failure with preserved ejection fraction, euvolemic. Hypokalemia, replaced. Hyperlipidemia, on statin. Depression, on Prozac. Hypothyroidism, on  Synthroid.  Goal of care: Severe and chronic interstitial lung disease with exacerbation in a patient with severe frailty and debility carries poor prognosis and probably nearing to terminal stage. Discussed with daughter and son-in-law, updated about clinical status as well as severity of condition.  Family agreeable to meet with palliative care team. Patient provided benefits with home hospice level of care on discharge and this is recommended.    DVT prophylaxis: apixaban (ELIQUIS) tablet 2.5 mg Start: 07/20/22 2200 apixaban (ELIQUIS) tablet 2.5 mg   Code Status: DNR Family Communication: Daughter and son-in-law on the phone Disposition Plan: Status is: Inpatient Remains inpatient appropriate because: Respiratory failure, IV antibiotics     Consultants:  PCCM Palliative care   Procedures:  None  Antimicrobials:  Rocephin 1/19---   Subjective: Patient was seen and examined.  Dry cough overnight.  Afebrile.  She tells me she feels short of breath even at rest when talking to me.  Not attempted to walk yet.  On minimal oxygen.  Objective: Vitals:   07/20/22 2251 07/21/22 0016 07/21/22 0429 07/21/22 0900  BP:  118/68 (!) 140/67 (!) 118/59  Pulse: 96 91 92   Resp: (!) '21 20 20   '$ Temp: (!) 97.4 F (36.3 C) (!) 97.2 F (36.2 C) (!) 97.2 F (36.2 C) 98.1 F (36.7 C)  TempSrc: Oral Axillary Axillary Axillary  SpO2: 97% 99% 96%   Weight:   56.8 kg     Intake/Output Summary (Last 24 hours) at 07/21/2022 1029 Last data filed at 07/21/2022 0900 Gross per 24 hour  Intake 336.96 ml  Output 275 ml  Net 61.96 ml   Filed Weights   07/21/22 0429  Weight: 56.8 kg    Examination:  General exam: Appears frail and debilitated.  In mild distress on completing sentences. Respiratory system: Upper airway sounds.  She does have some fine crackles at posterior lung fields.  On 2 L oxygen at rest. SpO2: 96 % O2 Flow Rate (L/min): 2 L/min  Cardiovascular system: S1 & S2 heard,  RRR. No pedal edema. Gastrointestinal system: Abdomen is nondistended, soft and nontender. No organomegaly or masses felt. Normal bowel sounds heard. Central nervous system: Alert and oriented. No focal neurological deficits.  Generalized weakness all extremities. Psychiatry: Judgement and insight appear normal. Mood he is anxious.       Data Reviewed: I have personally reviewed following labs and imaging studies  CBC: Recent Labs  Lab 07/20/22 1430 07/21/22 0044  WBC 6.1 3.9*  HGB 11.8* 10.9*  HCT 37.7 34.2*  MCV 89.1 88.4  PLT 322 875   Basic Metabolic Panel: Recent Labs  Lab 07/20/22 1430 07/21/22 0044  NA 136 138  K 3.3* 4.1  CL 101 99  CO2 27 27  GLUCOSE 115* 139*  BUN 6* 6*  CREATININE 0.54 0.63  CALCIUM 9.0 8.7*  MG  --  1.9   GFR: Estimated Creatinine Clearance: 45.3 mL/min (by C-G formula based on SCr of 0.63 mg/dL). Liver Function Tests: No results for input(s): "AST", "ALT", "ALKPHOS", "BILITOT", "PROT", "ALBUMIN" in the last 168 hours. No results for input(s): "LIPASE", "AMYLASE" in the last 168 hours. No results for input(s): "AMMONIA" in the last 168 hours. Coagulation Profile: No results for input(s): "INR", "PROTIME" in the last 168 hours. Cardiac Enzymes: No results for input(s): "CKTOTAL", "CKMB", "CKMBINDEX", "TROPONINI" in the last 168 hours. BNP (last 3 results) No results for input(s): "PROBNP" in the last 8760 hours. HbA1C: No results for input(s): "HGBA1C" in the last 72 hours. CBG: No results for input(s): "GLUCAP" in the last 168 hours. Lipid Profile: No results for input(s): "CHOL", "HDL", "LDLCALC", "TRIG", "CHOLHDL", "LDLDIRECT" in the last 72 hours. Thyroid Function Tests: No results for input(s): "TSH", "T4TOTAL", "FREET4", "T3FREE", "THYROIDAB" in the last 72 hours. Anemia Panel: No results for input(s): "VITAMINB12", "FOLATE", "FERRITIN", "TIBC", "IRON", "RETICCTPCT" in the last 72 hours. Sepsis Labs: Recent Labs  Lab  07/20/22 1430  PROCALCITON <0.10    Recent Results (from the past 240 hour(s))  Resp panel by RT-PCR (RSV, Flu A&B, Covid) Anterior Nasal Swab     Status: Abnormal   Collection Time: 07/20/22  3:04 PM   Specimen: Anterior Nasal Swab  Result Value Ref Range Status   SARS Coronavirus 2 by RT PCR NEGATIVE NEGATIVE Final    Comment: (NOTE) SARS-CoV-2 target nucleic acids are NOT DETECTED.  The SARS-CoV-2 RNA is generally detectable in upper respiratory specimens during the acute phase of infection. The lowest concentration of SARS-CoV-2 viral copies this assay can detect is 138 copies/mL. A negative result does not preclude SARS-Cov-2 infection and should not be used as the sole basis for treatment or other patient management decisions. A negative result may occur with  improper specimen collection/handling, submission of specimen other than nasopharyngeal swab, presence of viral mutation(s) within the areas targeted by this assay, and inadequate number of viral copies(<138 copies/mL). A negative result must be combined with clinical observations, patient history, and epidemiological information. The expected result is Negative.  Fact Sheet for Patients:  EntrepreneurPulse.com.au  Fact Sheet for Healthcare Providers:  IncredibleEmployment.be  This test is no t yet approved or cleared by the [place] FDA and  has been authorized for detection and/or diagnosis of SARS-CoV-2 by FDA under an Emergency Use  Authorization (EUA). This EUA will remain  in effect (meaning this test can be used) for the duration of the COVID-19 declaration under Section 564(b)(1) of the Act, 21 U.S.C.section 360bbb-3(b)(1), unless the authorization is terminated  or revoked sooner.       Influenza A by PCR NEGATIVE NEGATIVE Final   Influenza B by PCR NEGATIVE NEGATIVE Final    Comment: (NOTE) The Xpert Xpress SARS-CoV-2/FLU/RSV plus assay is intended as an  aid in the diagnosis of influenza from Nasopharyngeal swab specimens and should not be used as a sole basis for treatment. Nasal washings and aspirates are unacceptable for Xpert Xpress SARS-CoV-2/FLU/RSV testing.  Fact Sheet for Patients: EntrepreneurPulse.com.au  Fact Sheet for Healthcare Providers: IncredibleEmployment.be  This test is not yet approved or cleared by the [place] FDA and has been authorized for detection and/or diagnosis of SARS-CoV-2 by FDA under an Emergency Use Authorization (EUA). This EUA will remain in effect (meaning this test can be used) for the duration of the COVID-19 declaration under Section 564(b)(1) of the Act, 21 U.S.C. section 360bbb-3(b)(1), unless the authorization is terminated or revoked.     Resp Syncytial Virus by PCR POSITIVE (A) NEGATIVE Final    Comment: (NOTE) Fact Sheet for Patients: EntrepreneurPulse.com.au  Fact Sheet for Healthcare Providers: IncredibleEmployment.be  This test is not yet approved or cleared by the [place] FDA and has been authorized for detection and/or diagnosis of SARS-CoV-2 by FDA under an Emergency Use Authorization (EUA). This EUA will remain in effect (meaning this test can be used) for the duration of the COVID-19 declaration under Section 564(b)(1) of the Act, 21 U.S.C. section 360bbb-3(b)(1), unless the authorization is terminated or revoked.  Performed at [facility], [place] [address] [place], [place] [address]   Respiratory (~20 pathogens) panel by PCR     Status: Abnormal   Collection Time: 07/20/22  3:04 PM   Specimen: Nasopharyngeal Swab; Respiratory  Result Value Ref Range Status   Adenovirus NOT DETECTED NOT DETECTED Final   Coronavirus 229E NOT DETECTED NOT DETECTED Final    Comment: (NOTE) The Coronavirus on the Respiratory Panel, DOES NOT test for the novel  Coronavirus (2019 nCoV)    Coronavirus  HKU1 NOT DETECTED NOT DETECTED Final   Coronavirus NL63 NOT DETECTED NOT DETECTED Final   Coronavirus OC43 NOT DETECTED NOT DETECTED Final   Metapneumovirus NOT DETECTED NOT DETECTED Final   Rhinovirus / Enterovirus NOT DETECTED NOT DETECTED Final   Influenza A NOT DETECTED NOT DETECTED Final   Influenza B NOT DETECTED NOT DETECTED Final   Parainfluenza Virus 1 NOT DETECTED NOT DETECTED Final   Parainfluenza Virus 2 NOT DETECTED NOT DETECTED Final   Parainfluenza Virus 3 NOT DETECTED NOT DETECTED Final   Parainfluenza Virus 4 NOT DETECTED NOT DETECTED Final   Respiratory Syncytial Virus DETECTED (A) NOT DETECTED Final   Bordetella pertussis NOT DETECTED NOT DETECTED Final   Bordetella Parapertussis NOT DETECTED NOT DETECTED Final   Chlamydophila pneumoniae NOT DETECTED NOT DETECTED Final   Mycoplasma pneumoniae NOT DETECTED NOT DETECTED Final    Comment: Performed at [facility], [place]. [address], [place], [place] [address]  Expectorated Sputum Assessment w Gram Stain, Rflx to Resp Cult     Status: None   Collection Time: 07/20/22  4:50 PM   Specimen: Expectorated Sputum  Result Value Ref Range Status   Specimen Description EXPECTORATED SPUTUM  Final   Special Requests NONE  Final   Sputum evaluation  Final    Sputum specimen not acceptable for testing.  Please recollect.   NOTIFIED RN LINDSEY LEGO ON 07/20/22 @ 2239 BY DRT Performed at [facility], [place] [address], [place], [place] [address]    Report Status 07/20/2022 FINAL  Final  Culture, blood (Routine X 2) w Reflex to ID Panel     Status: None (Preliminary result)   Collection Time: 07/20/22  6:24 PM   Specimen: BLOOD  Result Value Ref Range Status   Specimen Description BLOOD BLOOD RIGHT FOREARM  Final   Special Requests   Final    BOTTLES DRAWN AEROBIC AND ANAEROBIC Blood Culture results may not be optimal due to an excessive volume of blood received in culture bottles   Culture   Final    NO GROWTH < 24  HOURS Performed at [facility], [place] [address] [place], [place] [address]    Report Status PENDING  Incomplete  Culture, blood (Routine X 2) w Reflex to ID Panel     Status: None (Preliminary result)   Collection Time: 07/20/22 10:58 PM   Specimen: BLOOD  Result Value Ref Range Status   Specimen Description BLOOD LEFT ANTECUBITAL  Final   Special Requests   Final    BOTTLES DRAWN AEROBIC AND ANAEROBIC Blood Culture adequate volume   Culture   Final    NO GROWTH < 12 HOURS Performed at [facility], [place] [address], [place], [place] [address]    Report Status PENDING  Incomplete         Radiology Studies: DG Chest Portable 1 View  Result Date: 07/20/2022 CLINICAL DATA:  Shortness of breath for a few weeks getting worse EXAM: PORTABLE CHEST 1 VIEW COMPARISON:  Portable exam 1535 hours compared to CT chest 12/12/2021 and chest radiograph 06/17/2021 FINDINGS: Enlargement of cardiac silhouette. Atherosclerotic calcification aorta. Severe diffuse interstitial infiltrates with upper lobe predominance, slightly increased from prior CT. No definite pleural effusion or pneumothorax. Bones demineralized with old posttraumatic deformity proximal LEFT humerus. IMPRESSION: Severe chronic interstitial lung disease with upper lobe predominance, greater on LEFT, slightly increased from previous exam. Aortic Atherosclerosis (ICD10-I70.0). Electronically Signed   By: Lavonia Dana M.D.   On: 07/20/2022 15:52        Scheduled Meds:  apixaban  2.5 mg Oral BID   arformoterol  15 mcg Nebulization BID   budesonide (PULMICORT) nebulizer solution  0.5 mg Nebulization BID   FLUoxetine  20 mg Oral Daily   guaiFENesin  600 mg Oral BID   ipratropium-albuterol  3 mL Nebulization Q6H   levothyroxine  50 mcg Oral Daily   methylPREDNISolone (SOLU-MEDROL) injection  60 mg Intravenous Daily   pravastatin  40 mg Oral QHS   sodium chloride flush  3 mL Intravenous Q12H   Continuous Infusions:   cefTRIAXone (ROCEPHIN)  IV Stopped (07/20/22 1916)     LOS: 1 day    Time spent: 40 minutes    Barb Merino, MD [facility] Pager [phone]

## 2022-07-21 NOTE — Consult Note (Addendum)
Consultation Note Date: 07/21/2022 at 1340  Patient Name: Stephanie Cortez  DOB: 26-Jul-1933  MRN: [id number]  Age / Sex: 89 y.o., female  PCP: Marin Olp, MD Referring Physician: Barb Merino, MD  Reason for Consultation: Establishing goals of care  HPI/Patient Profile: 89 y.o. female  with past medical history of longstanding cricopharyngeal achalasia (successful treatment previously with Botulinin toxin), ILD, permanent A-fib (Eliquis), HFpEF, HTN, hypothyroidism admitted on 07/20/2022 with weakness.  Patient was scheduled for follow-up with up with Dr. Chase Caller on 1/19.  However, that morning patient was too weak for outpatient visit and was subsequently transferred to [facility] ED by EMS.  On admission, patient tested positive for RSV infection.  She is being treated with a steroid taper, Brovana/Pulmicort, DuoNebs, guaifenesin, frequent flutter valve usage, and IV Lasix.  Patient is being followed by CCM. PMT was consulted to discussed goals of care.   Clinical Assessment and Goals of Care: I have reviewed medical records including EPIC notes, labs and imaging, assessed the patient and then met with patient, her daughter [name], and son in law Mitzi Hansen to discuss diagnosis prognosis, [place], EOL wishes, disposition and options.  I introduced Palliative Medicine as specialized medical care for people living with serious illness. It focuses on providing relief from the symptoms and stress of a serious illness. The goal is to improve quality of life for both the patient and the family.  We discussed a brief life review of the patient. Patient is originally from [place]. She is separated from her husband (30+ years ago, but no legal divorce) and has two children, [name] and Lennette Bihari. She has several adult grandchildren, two of whom Dani Gobble and Ovid Curd) she enjoys bragging and telling stories about. 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  As far as functional and nutritional status patient has had functional decline since her falls and multiple hip surgeries in 2021. She also has experienced poor PO intake d/t achalasia. She also endorses just not feeling hungry lately.  We discussed patient's current illness and what it means in the larger context of patient's on-going co-morbidities. ILD, RSV, and overall functional abilities discussed.   I attempted to elicit values and goals of care important to the patient. Patient hopes to return home and not have to use her walker to walk. She also remains hopeful she can drive again. We discussed reframing her expectations with more feasible goals, such as increasing nutrition, gaining gait strength, and conserving energy.   Patient requested ensure as this is a food she is able to swallow/toelrate and enjoys. Ensure TIB between meals ordered.   Advance directives, concepts specific to code status, artificial feeding and hydration, and rehospitalization were considered and discussed. Hospice and hospice philosophy discussed in detail. The difference between aggressive medical intervention and comfort care was considered. Treating the treatable and aging in place at home reviewed.    Education offered regarding concept specific to human mortality and the limitations of medical interventions to prolong life  when the body begins to fail to thrive.  Given patient is still legally married but wishes does her daughter to be her [facility] decision maker, spiritual care consult placed for discussion/creation of HCPOA.   Discussed with patient/family the importance of continued conversation with family and the medical providers regarding overall plan of care and treatment options, ensuring decisions are within the context of the patient's values and GOCs.    Patient is not prepared to consider hospice services at this time but is open to continuing  discussions on hospice/hospice philosophy.  Questions and concerns were addressed. The family was encouraged to call with questions or concerns. PMT contact info given. PMT will continue to follow.   Primary Decision Maker PATIENT  Physical Exam HENT:     Head:     Comments: Temporal wasting, frail, thin Eyes:     Pupils: Pupils are equal, round, and reactive to light.  Cardiovascular:     Rate and Rhythm: Tachycardia present.  Pulmonary:     Effort: Pulmonary effort is normal.     Comments: Dyspnea with long conversation Musculoskeletal:     Comments: Generalized weakness  Neurological:     Mental Status: She is alert and oriented to person, place, and time.  Psychiatric:        Mood and Affect: Mood normal. Mood is not anxious.        Behavior: Behavior normal. Behavior is not agitated.     Palliative Assessment/Data: 40%     Thank you for this consult. Palliative medicine will continue to follow and assist holistically.   Time Total: 125 minutes Greater than 50%  of this time was spent counseling and coordinating care related to the above assessment and plan.  Signed by: Jordan Hawks, DNP, FNP-BC Palliative Medicine    Please contact Palliative Medicine Team phone at [phone] for questions and concerns.  For individual provider: See Shea Evans

## 2022-07-21 NOTE — Progress Notes (Signed)
Micro lab called to request for new order of AFB + sputum culture because the initial specimen was mostly saliva and need to obtain new sputum. MD notified to place a new order.

## 2022-07-21 NOTE — Consult Note (Signed)
NAME:  Stephanie Cortez, MRN:  [id number], DOB:  October 21, 1933, LOS: 1 ADMISSION DATE:  07/20/2022, CONSULTATION DATE:  07/20/22 REFERRING MD:  Alvino Chapel CHIEF COMPLAINT:  Dyspnea   History of Present Illness:  Stephanie Cortez is a 89 y.o. female who has a PMH as below including but not limited to ILD, felt to be 2/2 HSP. She is followed by Dr. Chase Caller in the office, last seen 03/15/22 and was scheduled for follow up 07/20/22. Unfortunately, when daughter went to pick her up, she was so weak and almost incoherent. EMS was ultimately called and pt was found to have sats in 23s. She was subsequently brought to [facility] ED for further evaluation.  In ED, she required 2L O2 via [place] to maintain sats in the 90s. This oxygen requirement is new for her. Her workup is still currently pending; however, given new O2 requirement and her baseline ILD, PCCM asked to see in consultation.  She had HRCT 12/12/21 and in comparison to the prior, there was only slight progression. Antifibrotics were not recommended and she was advised to continue with supportive care. On office note from 03/15/22, there is mention of hx of choking and dysphagia. SHe has been evaluated by ENT at [facility] who felt that she might have LPR. They referred her to laryngoscopy for further evaluation of possible esophageal disorders.  Pertinent  Medical History:  has Permanent atrial fibrillation ([place]); Bronchiectasis ([place]); Edema; Hypothyroid; Diverticulitis; GERD (gastroesophageal reflux disease); Depression; Orthostatic lightheadedness; Coronary artery calcification seen on CAT scan; Nodule of left lung; Heart failure with preserved ejection fraction ([place]); Upper airway cough syndrome; ILD (interstitial lung disease) ([place]); NSIP (nonspecific interstitial pneumonia) ([place]); Chronic sinusitis; Encounter for preoperative pulmonary examination; Acute bronchitis; Cough; Hoarseness; Sensorineural hearing loss (SNHL) of both ears; Tinnitus, bilateral; Closed right hip  fracture, initial encounter ([place]); Subcapital fracture of femur ([place]); Leukocytosis; Acute blood loss anemia; Chronic anticoagulation; Essential hypertension; Periprosthetic fracture around internal prosthetic hip joint, initial encounter; Periprosthetic fracture around internal prosthetic hip joint; Small bowel ischemia ([place]); Mesenteric ischemia ([place]); Debility; S/P percutaneous endoscopic gastrostomy (PEG) tube placement ([place]); Chronic pain of right knee; COVID-19 virus infection; Acute respiratory failure with hypoxia ([place]); Chronic anemia; Disorder of cervical esophageal region; Cricopharyngeal achalasia; Constipation; Hyperlipidemia, unspecified; and RSV (respiratory syncytial virus infection) on their problem list.  Significant Hospital Events: Including procedures, antibiotic start and stop dates in addition to other pertinent events   1/19 admit  Interim History / Subjective:  Feeling a little better today, brighter affect  Objective:  Blood pressure (!) 118/59, pulse 92, temperature 98.1 F (36.7 C), temperature source Axillary, resp. rate 20, weight 56.8 kg, SpO2 96 %.        Intake/Output Summary (Last 24 hours) at 07/21/2022 1145 Last data filed at 07/21/2022 0900 Gross per 24 hour  Intake 336.96 ml  Output 275 ml  Net 61.96 ml   Filed Weights   07/21/22 0429  Weight: 56.8 kg    Examination: General appearance: 89 y.o., female, NAD, conversant  Eyes: anicteric sclerae; PERRL, tracking appropriately HENT: NCAT; MMM Neck: Trachea midline; no lymphadenopathy, no JVD Lungs: loud rhonchi, crackles, with normal respiratory effort CV: IRIR, no murmur  Abdomen: Soft, non-tender; non-distended, BS present  Extremities: RLE reddish discoloration chronic, trace edema, warm Skin: Normal turgor and texture; no rash Psych: Appropriate affect Neuro: Alert and oriented to person and place, no focal deficit     Labs/imaging personally reviewed:  No new imaging  RVP RSV  positive  Assessment & Plan:   # RSV infection # Dyspnea # Hypoxia  Suspect decompensation here mostly related to her RSV infection. - Continue supplemental O2 as needed to maintain SpO2 > 90%. Currently I have taken her off oxygen.  - Will need ambulatory desaturation study prior to d/c to assess whether she will now require home O2 needs. - stop ABX - would taper steroids over next 5-10 days - brovana/pulmicort, duonebs - guaifenesin, frequent flutter  - repeat lasix IV 40  - we will ensure she has clinic follow up as she nears discharge    Best practice (evaluated daily):  Per primary.  Critical care time: na

## 2022-07-21 NOTE — Progress Notes (Signed)
Patient's daughter called the station claiming her mother got some confusion. Checked the patient and talked to her. Patient said she had a very long sleep and when she woke up she can't recall where she was but eventually remembered she's in the hospital. Alert and oriented x 4. Patient called her daughter again and this nurse spoke with the daughter also. Will continue to monitor.

## 2022-07-21 NOTE — Discharge Instructions (Signed)

## 2022-07-22 DIAGNOSIS — J9601 Acute respiratory failure with hypoxia: Secondary | ICD-10-CM | POA: Diagnosis not present

## 2022-07-22 MED ORDER — PREDNISONE 50 MG PO TABS
50.0000 mg | ORAL_TABLET | Freq: Every day | ORAL | Status: DC
  Administered 2022-07-23 – 2022-07-26 (×4): 50 mg via ORAL
  Filled 2022-07-22 (×4): qty 1

## 2022-07-22 MED ORDER — IPRATROPIUM-ALBUTEROL 0.5-2.5 (3) MG/3ML IN SOLN
3.0000 mL | RESPIRATORY_TRACT | Status: DC | PRN
  Administered 2022-07-25: 3 mL via RESPIRATORY_TRACT
  Filled 2022-07-22: qty 3

## 2022-07-22 MED ORDER — POLYVINYL ALCOHOL 1.4 % OP SOLN
1.0000 [drp] | OPHTHALMIC | Status: DC | PRN
  Administered 2022-07-22 – 2022-07-25 (×2): 1 [drp] via OPHTHALMIC
  Filled 2022-07-22 (×3): qty 15

## 2022-07-22 NOTE — Consult Note (Addendum)
NAME:  Stephanie Cortez, MRN:  [id number], DOB:  05/07/1934, LOS: 2 ADMISSION DATE:  07/20/2022, CONSULTATION DATE:  07/20/22 REFERRING MD:  Alvino Chapel CHIEF COMPLAINT:  Dyspnea   History of Present Illness:  Stephanie Cortez is a 89 y.o. female who has a PMH as below including but not limited to ILD, felt to be 2/2 HSP. She is followed by Dr. Chase Caller in the office, last seen 03/15/22 and was scheduled for follow up 07/20/22. Unfortunately, when daughter went to pick her up, she was so weak and almost incoherent. EMS was ultimately called and pt was found to have sats in 27s. She was subsequently brought to [facility] ED for further evaluation.  In ED, she required 2L O2 via [place] to maintain sats in the 90s. This oxygen requirement is new for her. Her workup is still currently pending; however, given new O2 requirement and her baseline ILD, PCCM asked to see in consultation.  She had HRCT 12/12/21 and in comparison to the prior, there was only slight progression. Antifibrotics were not recommended and she was advised to continue with supportive care. On office note from 03/15/22, there is mention of hx of choking and dysphagia. SHe has been evaluated by ENT at [facility] who felt that she might have LPR. They referred her to laryngoscopy for further evaluation of possible esophageal disorders.  Pertinent  Medical History:  has Permanent atrial fibrillation ([place]); Bronchiectasis ([place]); Edema; Hypothyroid; Diverticulitis; GERD (gastroesophageal reflux disease); Depression; Orthostatic lightheadedness; Coronary artery calcification seen on CAT scan; Nodule of left lung; Heart failure with preserved ejection fraction ([place]); Upper airway cough syndrome; ILD (interstitial lung disease) ([place]); NSIP (nonspecific interstitial pneumonia) ([place]); Chronic sinusitis; Encounter for preoperative pulmonary examination; Acute bronchitis; Cough; Hoarseness; Sensorineural hearing loss (SNHL) of both ears; Tinnitus, bilateral; Closed right hip  fracture, initial encounter ([place]); Subcapital fracture of femur ([place]); Leukocytosis; Acute blood loss anemia; Chronic anticoagulation; Essential hypertension; Periprosthetic fracture around internal prosthetic hip joint, initial encounter; Periprosthetic fracture around internal prosthetic hip joint; Small bowel ischemia ([place]); Mesenteric ischemia ([place]); Debility; S/P percutaneous endoscopic gastrostomy (PEG) tube placement ([place]); Chronic pain of right knee; COVID-19 virus infection; Acute respiratory failure with hypoxia ([place]); Chronic anemia; Disorder of cervical esophageal region; Cricopharyngeal achalasia; Palliative care by specialist; Constipation; Hyperlipidemia, unspecified; RSV (respiratory syncytial virus infection); and DNR (do not resuscitate) on their problem list.  Significant Hospital Events: Including procedures, antibiotic start and stop dates in addition to other pertinent events   1/19 admit  Interim History / Subjective:  Feeling better today SpO2 100% on 2L  Objective:  Blood pressure 109/85, pulse 84, temperature (!) 97.3 F (36.3 C), temperature source Oral, resp. rate 19, weight 56.7 kg, SpO2 99 %.        Intake/Output Summary (Last 24 hours) at 07/22/2022 1421 Last data filed at 07/22/2022 0930 Gross per 24 hour  Intake 300 ml  Output 1000 ml  Net -700 ml   Filed Weights   07/21/22 0429 07/22/22 0101  Weight: 56.8 kg 56.7 kg   Physical Exam: General: Well-appearing, no acute distress HENT: [place], AT Eyes: EOMI, no scleral icterus Respiratory: Occasional squeaks. No crackles, wheezing or rales Cardiovascular: RRR, -M/R/G, no JVD Extremities:-Edema,-tenderness Neuro: AAO x4, CNII-XII grossly intact Psych: Normal mood, normal affect     Labs/imaging personally reviewed:  No new imaging  RVP RSV positive  Assessment & Plan:   # RSV infection # Dyspnea # Hypoxia  Suspect decompensation here mostly related to her RSV infection. - Wean  supplemental O2  as needed to maintain SpO2 > 90%.  - Will need ambulatory desaturation study prior to d/c to assess whether she will now require home O2 needs. - Off antibiotics - Steroid taper, decreasing by 10 mg every 3 days - brovana/pulmicort, duonebs. When closer to discharge recommend home Symbicort TWO puffs TWICE a day until seen in clinic - guaifenesin, frequent flutter  - Patient to contact office for follow-up with Dr. Chase Caller after discharge  Pulmonary available as needed.   Best practice (evaluated daily):  Per primary.  Critical care time: na    Care Time: 25 min  Rodman Pickle, M.D. [facility] Pulmonary/Critical Care Medicine 07/22/2022 2:22 PM   See Amion for personal pager For hours between 7 PM to 7 AM, please call Elink for urgent questions

## 2022-07-22 NOTE — Progress Notes (Signed)
RN noticed on the monitor that the RA leads was off so she went to check the patient. This RN noticed that the patient has been removing her leads as well as the spo2 sensor. Patient wants her own clothes to put on because she wants to go to her room next door. Patient had some episode again of confusion and probably forgetfulness however patient was easily re-oriented. Patient called her daughter and talked, RN and NT was in the room as well to speak to the daughter. Noticed that the suction canister was empty. Bladder scan done 158 ml. Encouraged the patient to use [facility] with the use of walker. Patient able to urinate, after which we placed her comfortably on bed. She apologized for what happened earlier. Will continue to monitor her.

## 2022-07-22 NOTE — Progress Notes (Signed)
PROGRESS NOTE    Stephanie Cortez  XIP:[id number] DOB: 1934-02-14 DOA: 07/20/2022 PCP: Marin Olp, MD    Brief Narrative:  89 y.o. female with medical history significant for ILD, permanent A-fib on Eliquis, HFpEF, HTN, hypothyroidism who presented to the ED for evaluation of dyspnea. Patient does have ongoing symptoms for about more than 3 months now with progressive lethargy and exertional dyspnea.  Symptoms worse.  2 weeks.  Use different cough medications as outpatient without much improvement.  She was supposed to be seen at pulmonary office but at home she was found almost incoherent so EMS was called.  She was found 77% on room air by EMS.  Put on nonrebreather and brought to the ER. In the emergency room, able to wean off on 2 to 3 L of oxygen.  Electrolytes are stable.  Chest x-ray showed severe chronic interstitial lung disease with upper lobe predominance slightly increased from previous exam.  Also seen by critical care.  Admitted.  Respiratory virus panel was positive for RSV.   Assessment & Plan:   Acute exacerbation of chronic interstitial lung disease, acute hypoxemic respiratory failure. Acute RSV infection  Aggressive bronchodilator therapy, IV steroids, inhalational steroids, scheduled and as needed bronchodilators, deep breathing exercises, incentive spirometry, chest physiotherapy.  Mucolytic's. Sputum cultures, Legionella and streptococcal antigen.  Blood cultures.  Negative so far. Initially on antibiotics that was discontinued. Supplemental oxygen to keep saturations more than 90%. PT/OT/speech.  Followed by pulmonary.  Evaluate for home oxygen need.   Chronic medical issues including Chronic A-fib, stable.  Therapeutic on Eliquis.  Not on any rate control medications. Heart failure with preserved ejection fraction, euvolemic. Hypokalemia, replaced. Hyperlipidemia, on statin. Depression, on Prozac. Hypothyroidism, on Synthroid.  Hospital-acquired  delirium: Patient had mild delirium last night but resolved with reorientation and sleep with trazodone.  Fall precautions.  Delirium precautions.  Discontinue cardiac monitor to avoid allowing fatigue and disorientation.  Goal of care: Severe and chronic interstitial lung disease with exacerbation in a patient with severe frailty and debility carries poor prognosis.  Seen by palliative.  Currently not agreeable for home hospice but she is agreeable for outpatient palliative referral.  Will send outpatient palliative referral on discharge.    DVT prophylaxis: apixaban (ELIQUIS) tablet 2.5 mg Start: 07/20/22 2200 apixaban (ELIQUIS) tablet 2.5 mg   Code Status: DNR Family Communication: Daughter and son-in-law on the phone Disposition Plan: Status is: Inpatient Remains inpatient appropriate because: Respiratory failure,      Consultants:  PCCM Palliative care   Procedures:  None  Antimicrobials:  Rocephin 1/19--- 1/20.   Subjective:  Patient was seen and examined.  Patient is more alert and communicative.  She does remember getting confused after waking up last evening.  Afebrile.  On 2 L oxygen.  Communicating better with clear voice.  She does take trazodone at night at home.  Objective: Vitals:   07/22/22 0101 07/22/22 0354 07/22/22 0801 07/22/22 0924  BP:  113/63 109/63   Pulse:  83 68   Resp:  19 19   Temp:  (!) 97.5 F (36.4 C) (!) 97.3 F (36.3 C)   TempSrc:  Axillary Oral   SpO2:  100% 98% 97%  Weight: 56.7 kg       Intake/Output Summary (Last 24 hours) at 07/22/2022 1147 Last data filed at 07/22/2022 0930 Gross per 24 hour  Intake 300 ml  Output 1000 ml  Net -700 ml    Filed Weights   07/21/22 0429 07/22/22  0101  Weight: 56.8 kg 56.7 kg    Examination:  General exam: Appears thin, frail and debilitated.  Not in any distress today.  Able to keep up conversation. Respiratory system: Mostly bilateral clear.  Occasional conducted upper airway sounds.   On 2 L oxygen.  SpO2: 97 % O2 Flow Rate (L/min): 2 L/min  Cardiovascular system: S1 & S2 heard, RRR. No pedal edema. Gastrointestinal system: Abdomen is nondistended, soft and nontender. No organomegaly or masses felt. Normal bowel sounds heard. Central nervous system: Alert and oriented. No focal neurological deficits.  Generalized weakness all extremities. Psychiatry: Judgement and insight appear normal.  Normal mood today.    Data Reviewed: I have personally reviewed following labs and imaging studies  CBC: Recent Labs  Lab 07/20/22 1430 07/21/22 0044  WBC 6.1 3.9*  HGB 11.8* 10.9*  HCT 37.7 34.2*  MCV 89.1 88.4  PLT 322 076    Basic Metabolic Panel: Recent Labs  Lab 07/20/22 1430 07/21/22 0044  NA 136 138  K 3.3* 4.1  CL 101 99  CO2 27 27  GLUCOSE 115* 139*  BUN 6* 6*  CREATININE 0.54 0.63  CALCIUM 9.0 8.7*  MG  --  1.9    GFR: Estimated Creatinine Clearance: 45.2 mL/min (by C-G formula based on SCr of 0.63 mg/dL). Liver Function Tests: No results for input(s): "AST", "ALT", "ALKPHOS", "BILITOT", "PROT", "ALBUMIN" in the last 168 hours. No results for input(s): "LIPASE", "AMYLASE" in the last 168 hours. No results for input(s): "AMMONIA" in the last 168 hours. Coagulation Profile: No results for input(s): "INR", "PROTIME" in the last 168 hours. Cardiac Enzymes: No results for input(s): "CKTOTAL", "CKMB", "CKMBINDEX", "TROPONINI" in the last 168 hours. BNP (last 3 results) No results for input(s): "PROBNP" in the last 8760 hours. HbA1C: No results for input(s): "HGBA1C" in the last 72 hours. CBG: No results for input(s): "GLUCAP" in the last 168 hours. Lipid Profile: No results for input(s): "CHOL", "HDL", "LDLCALC", "TRIG", "CHOLHDL", "LDLDIRECT" in the last 72 hours. Thyroid Function Tests: No results for input(s): "TSH", "T4TOTAL", "FREET4", "T3FREE", "THYROIDAB" in the last 72 hours. Anemia Panel: No results for input(s): "VITAMINB12", "FOLATE",  "FERRITIN", "TIBC", "IRON", "RETICCTPCT" in the last 72 hours. Sepsis Labs: Recent Labs  Lab 07/20/22 1430  PROCALCITON <0.10     Recent Results (from the past 240 hour(s))  Resp panel by RT-PCR (RSV, Flu A&B, Covid) Anterior Nasal Swab     Status: Abnormal   Collection Time: 07/20/22  3:04 PM   Specimen: Anterior Nasal Swab  Result Value Ref Range Status   SARS Coronavirus 2 by RT PCR NEGATIVE NEGATIVE Final    Comment: (NOTE) SARS-CoV-2 target nucleic acids are NOT DETECTED.  The SARS-CoV-2 RNA is generally detectable in upper respiratory specimens during the acute phase of infection. The lowest concentration of SARS-CoV-2 viral copies this assay can detect is 138 copies/mL. A negative result does not preclude SARS-Cov-2 infection and should not be used as the sole basis for treatment or other patient management decisions. A negative result may occur with  improper specimen collection/handling, submission of specimen other than nasopharyngeal swab, presence of viral mutation(s) within the areas targeted by this assay, and inadequate number of viral copies(<138 copies/mL). A negative result must be combined with clinical observations, patient history, and epidemiological information. The expected result is Negative.  Fact Sheet for Patients:  EntrepreneurPulse.com.au  Fact Sheet for Healthcare Providers:  IncredibleEmployment.be  This test is no t yet approved or cleared by the [place]  States FDA and  has been authorized for detection and/or diagnosis of SARS-CoV-2 by FDA under an Emergency Use Authorization (EUA). This EUA will remain  in effect (meaning this test can be used) for the duration of the COVID-19 declaration under Section 564(b)(1) of the Act, 21 U.S.C.section 360bbb-3(b)(1), unless the authorization is terminated  or revoked sooner.       Influenza A by PCR NEGATIVE NEGATIVE Final   Influenza B by PCR NEGATIVE NEGATIVE  Final    Comment: (NOTE) The Xpert Xpress SARS-CoV-2/FLU/RSV plus assay is intended as an aid in the diagnosis of influenza from Nasopharyngeal swab specimens and should not be used as a sole basis for treatment. Nasal washings and aspirates are unacceptable for Xpert Xpress SARS-CoV-2/FLU/RSV testing.  Fact Sheet for Patients: EntrepreneurPulse.com.au  Fact Sheet for Healthcare Providers: IncredibleEmployment.be  This test is not yet approved or cleared by the [place] FDA and has been authorized for detection and/or diagnosis of SARS-CoV-2 by FDA under an Emergency Use Authorization (EUA). This EUA will remain in effect (meaning this test can be used) for the duration of the COVID-19 declaration under Section 564(b)(1) of the Act, 21 U.S.C. section 360bbb-3(b)(1), unless the authorization is terminated or revoked.     Resp Syncytial Virus by PCR POSITIVE (A) NEGATIVE Final    Comment: (NOTE) Fact Sheet for Patients: EntrepreneurPulse.com.au  Fact Sheet for Healthcare Providers: IncredibleEmployment.be  This test is not yet approved or cleared by the [place] FDA and has been authorized for detection and/or diagnosis of SARS-CoV-2 by FDA under an Emergency Use Authorization (EUA). This EUA will remain in effect (meaning this test can be used) for the duration of the COVID-19 declaration under Section 564(b)(1) of the Act, 21 U.S.C. section 360bbb-3(b)(1), unless the authorization is terminated or revoked.  Performed at [facility], [place] [address], [place], [place] [address]   Respiratory (~20 pathogens) panel by PCR     Status: Abnormal   Collection Time: 07/20/22  3:04 PM   Specimen: Nasopharyngeal Swab; Respiratory  Result Value Ref Range Status   Adenovirus NOT DETECTED NOT DETECTED Final   Coronavirus 229E NOT DETECTED NOT DETECTED Final    Comment: (NOTE) The Coronavirus on  the Respiratory Panel, DOES NOT test for the novel  Coronavirus (2019 nCoV)    Coronavirus HKU1 NOT DETECTED NOT DETECTED Final   Coronavirus NL63 NOT DETECTED NOT DETECTED Final   Coronavirus OC43 NOT DETECTED NOT DETECTED Final   Metapneumovirus NOT DETECTED NOT DETECTED Final   Rhinovirus / Enterovirus NOT DETECTED NOT DETECTED Final   Influenza A NOT DETECTED NOT DETECTED Final   Influenza B NOT DETECTED NOT DETECTED Final   Parainfluenza Virus 1 NOT DETECTED NOT DETECTED Final   Parainfluenza Virus 2 NOT DETECTED NOT DETECTED Final   Parainfluenza Virus 3 NOT DETECTED NOT DETECTED Final   Parainfluenza Virus 4 NOT DETECTED NOT DETECTED Final   Respiratory Syncytial Virus DETECTED (A) NOT DETECTED Final   Bordetella pertussis NOT DETECTED NOT DETECTED Final   Bordetella Parapertussis NOT DETECTED NOT DETECTED Final   Chlamydophila pneumoniae NOT DETECTED NOT DETECTED Final   Mycoplasma pneumoniae NOT DETECTED NOT DETECTED Final    Comment: Performed at [facility], [place]. [address], [place], [place] [address]  Expectorated Sputum Assessment w Gram Stain, Rflx to Resp Cult     Status: None   Collection Time: 07/20/22  4:50 PM   Specimen: Expectorated Sputum  Result Value Ref Range Status  Specimen Description EXPECTORATED SPUTUM  Final   Special Requests NONE  Final   Sputum evaluation   Final    Sputum specimen not acceptable for testing.  Please recollect.   NOTIFIED RN LINDSEY LEGO ON 07/20/22 @ 2239 BY DRT Performed at [facility], [place] [address], [place], [place] [address]    Report Status 07/20/2022 FINAL  Final  Culture, blood (Routine X 2) w Reflex to ID Panel     Status: None (Preliminary result)   Collection Time: 07/20/22  6:24 PM   Specimen: BLOOD  Result Value Ref Range Status   Specimen Description BLOOD BLOOD RIGHT FOREARM  Final   Special Requests   Final    BOTTLES DRAWN AEROBIC AND ANAEROBIC Blood Culture results may not be optimal due to an  excessive volume of blood received in culture bottles   Culture   Final    NO GROWTH 2 DAYS Performed at [facility], [place] [address] [place], [place] [address]    Report Status PENDING  Incomplete  Culture, blood (Routine X 2) w Reflex to ID Panel     Status: None (Preliminary result)   Collection Time: 07/20/22 10:58 PM   Specimen: BLOOD  Result Value Ref Range Status   Specimen Description BLOOD LEFT ANTECUBITAL  Final   Special Requests   Final    BOTTLES DRAWN AEROBIC AND ANAEROBIC Blood Culture adequate volume   Culture   Final    NO GROWTH 2 DAYS Performed at [facility], [place] [address], [place], [place] [address]    Report Status PENDING  Incomplete         Radiology Studies: DG Chest Portable 1 View  Result Date: 07/20/2022 CLINICAL DATA:  Shortness of breath for a few weeks getting worse EXAM: PORTABLE CHEST 1 VIEW COMPARISON:  Portable exam 1535 hours compared to CT chest 12/12/2021 and chest radiograph 06/17/2021 FINDINGS: Enlargement of cardiac silhouette. Atherosclerotic calcification aorta. Severe diffuse interstitial infiltrates with upper lobe predominance, slightly increased from prior CT. No definite pleural effusion or pneumothorax. Bones demineralized with old posttraumatic deformity proximal LEFT humerus. IMPRESSION: Severe chronic interstitial lung disease with upper lobe predominance, greater on LEFT, slightly increased from previous exam. Aortic Atherosclerosis (ICD10-I70.0). Electronically Signed   By: Lavonia Dana M.D.   On: 07/20/2022 15:52        Scheduled Meds:  apixaban  2.5 mg Oral BID   arformoterol  15 mcg Nebulization BID   budesonide (PULMICORT) nebulizer solution  0.5 mg Nebulization BID   feeding supplement  237 mL Oral BID BM   FLUoxetine  20 mg Oral Daily   guaiFENesin  600 mg Oral BID   ipratropium-albuterol  3 mL Nebulization TID   levothyroxine  50 mcg Oral Daily   methylPREDNISolone (SOLU-MEDROL) injection  60 mg  Intravenous Daily   pravastatin  40 mg Oral QHS   sodium chloride flush  3 mL Intravenous Q12H   Continuous Infusions:     LOS: 2 days    Time spent: 35 minutes    Barb Merino, MD [facility] Pager [phone]

## 2022-07-22 NOTE — Progress Notes (Signed)
Patient requests trazodone to help her fall asleep or any medicine similar to it. MD notified.

## 2022-07-22 NOTE — Evaluation (Signed)
Physical Therapy Evaluation Patient Details Name: Stephanie Cortez MRN: [id number] DOB: 1934/03/26 Today's Date: 07/22/2022  History of Present Illness  Pt is an 89 y.o. female who presented 07/20/22 with dyspnea. Pt admitted with RSV infection, and acute exacerbation of chronic interstitial lung disease. PMH: anemia, afib, CHF, COPD, HTN, hypothyroid, bil feet neuropathy, vertigo   Clinical Impression  Pt presents with condition above and deficits mentioned below, see PT Problem List. PTA, she was living alone in a 1-level house with 2-3 STE, performing functional mobility mod I with her RW. Her daughter lives nearby and comes to check on her daily. Her daughter assists with bathing, driving, and meals. She hires someone to clean. Pt reports a hx of chronic R hip issues since fracturing it. She tends to extend her R knee and place her R foot anterior to her L and off the ground when transitioning to sit EOB and transferring to/from stand, likely how she had been doing it at baseline due to her chronic R hip issues. The positioning of her R leg is causing her to lean posteriorly though, impacting her balance. Pt required minA to recover her posterior LOB sitting EOB and required minA to transfer to stand and ambulate in the room the first bout. However, pt progressed to only needing min guard assist for safety with transfers and to ambulating in the room the second bout. Pt verbalizes understanding that she is at risk for falls and does not want to go to a SNF. Thus, recommending follow-up with HHPT upon d/c provided pt can obtain some increased assistance initially. Will continue to follow acutely.   *SpO2 >/= 90% on RA at rest, down to 87% on RA with gait, >/= 92% on 2L with gait    Recommendations for follow up therapy are one component of a multi-disciplinary discharge planning process, led by the attending physician.  Recommendations may be updated based on patient status, additional functional  criteria and insurance authorization.  Follow Up Recommendations Home health PT (pt declining SNF)      Assistance Recommended at Discharge Intermittent Supervision/Assistance  Patient can return home with the following  A little help with walking and/or transfers;A little help with bathing/dressing/bathroom;Assistance with cooking/housework;Assist for transportation;Help with stairs or ramp for entrance    Equipment Recommendations BSC/3in1  Recommendations for Other Services       Functional Status Assessment Patient has had a recent decline in their functional status and demonstrates the ability to make significant improvements in function in a reasonable and predictable amount of time.     Precautions / Restrictions Precautions Precautions: Fall;Other (comment) Precaution Comments: watch SpO2 Restrictions Weight Bearing Restrictions: No      Mobility  Bed Mobility Overal bed mobility: Needs Assistance Bed Mobility: Supine to Sit     Supine to sit: Min assist, HOB elevated     General bed mobility comments: Pt with a posterior lean, kicking her R leg out off the ground with knee extended once over the edge of the bed to sit up. MinA to recover posterior LOB.    Transfers Overall transfer level: Needs assistance Equipment used: Rolling walker (2 wheels) Transfers: Sit to/from Stand Sit to Stand: Min guard, Min assist           General transfer comment: Pt holds R knee in extension and foot off thr ground, anterior to her L, with transfers. Needs cues to reach back to control her descent to sit. MinA the first rep sit <> stand  but progressed to min guard for safety by the second rep.    Ambulation/Gait Ambulation/Gait assistance: Min guard, Min assist Gait Distance (Feet): 30 Feet (x2 bouts of ~30 ft > ~16 ft) Assistive device: Rolling walker (2 wheels) Gait Pattern/deviations: Step-through pattern, Decreased step length - right, Decreased step length - left,  Decreased stride length Gait velocity: reduced Gait velocity interpretation: <1.31 ft/sec, indicative of household ambulator   General Gait Details: Pt with slow, small steps, needing minA for stability the first rep but progressed to min guard assist by the second rep  Stairs            Wheelchair Mobility    Modified Rankin (Stroke Patients Only)       Balance Overall balance assessment: Needs assistance Sitting-balance support: Feet supported, Single extremity supported, Bilateral upper extremity supported Sitting balance-Leahy Scale: Poor Sitting balance - Comments: Posterior bias and x2 LOB needing minA to recover. Postural control: Posterior lean Standing balance support: Bilateral upper extremity supported, During functional activity, Reliant on assistive device for balance Standing balance-Leahy Scale: Poor Standing balance comment: Reliant on RW and up to minA                             Pertinent Vitals/Pain Pain Assessment Pain Assessment: 0-10 Pain Score: 4  Pain Location: R hip (chronic) Pain Descriptors / Indicators: Discomfort Pain Intervention(s): Limited activity within patient's tolerance, Monitored during session, Repositioned    Home Living Family/patient expects to be discharged to:: Private residence Living Arrangements: Alone Available Help at Discharge: Family;Available PRN/intermittently Type of Home: House Home Access: Stairs to enter Entrance Stairs-Rails: Can reach both Entrance Stairs-Number of Steps: 2-3   Home Layout: One level Home Equipment: Shower seat;Hand held shower head;Grab bars - tub/shower;Grab bars - toilet;Toilet riser;Rolling Walker (2 wheels);Wheelchair - manual Additional Comments: Daughter comes by daily; may be able to hire more around the clock assistance    Prior Function Prior Level of Function : Needs assist             Mobility Comments: Mod I using RW. No falls in past 6 months. ADLs  Comments: Daughter assists with bathing. She hires someone to clean. Daughter does cooking and driving.     Hand Dominance   Dominant Hand: Right    Extremity/Trunk Assessment   Upper Extremity Assessment Upper Extremity Assessment: Defer to OT evaluation    Lower Extremity Assessment Lower Extremity Assessment: Generalized weakness;RLE deficits/detail RLE Deficits / Details: hx of R hip fx resulting in chronic R hip issues, tends to hold R leg anterior to her L with R knee extended with transfers and when sitting EOB       Communication   Communication: No difficulties  Cognition Arousal/Alertness: Awake/alert Behavior During Therapy: WFL for tasks assessed/performed Overall Cognitive Status: Within Functional Limits for tasks assessed                                          General Comments General comments (skin integrity, edema, etc.): SpO2 >/= 90% on RA at rest, down to 87% on RA with gait, >/= 92% on 2L with gait; encouraged use of IS    Exercises     Assessment/Plan    PT Assessment Patient needs continued PT services  PT Problem List Decreased strength;Decreased activity tolerance;Decreased balance;Decreased mobility;Cardiopulmonary status limiting activity  PT Treatment Interventions DME instruction;Gait training;Stair training;Functional mobility training;Therapeutic activities;Therapeutic exercise;Balance training;Neuromuscular re-education;Patient/family education    PT Goals (Current goals can be found in the Care Plan section)  Acute Rehab PT Goals Patient Stated Goal: to go home at d/c PT Goal Formulation: With patient Time For Goal Achievement: 08/05/22 Potential to Achieve Goals: Good    Frequency Min 3X/week     Co-evaluation               AM-PAC PT "6 Clicks" Mobility  Outcome Measure Help needed turning from your back to your side while in a flat bed without using bedrails?: None Help needed moving from lying  on your back to sitting on the side of a flat bed without using bedrails?: A Little Help needed moving to and from a bed to a chair (including a wheelchair)?: A Little Help needed standing up from a chair using your arms (e.g., wheelchair or bedside chair)?: A Little Help needed to walk in hospital room?: A Little Help needed climbing 3-5 steps with a railing? : A Lot 6 Click Score: 18    End of Session Equipment Utilized During Treatment: Gait belt;Oxygen Activity Tolerance: Patient tolerated treatment well Patient left: in chair;with call bell/phone within reach;with chair alarm set Nurse Communication: Mobility status;Other (comment) (sats) PT Visit Diagnosis: Unsteadiness on feet (R26.81);Other abnormalities of gait and mobility (R26.89);Muscle weakness (generalized) (M62.81);Difficulty in walking, not elsewhere classified (R26.2)    Time: 3729-0211 PT Time Calculation (min) (ACUTE ONLY): 32 min   Charges:   PT Evaluation $PT Eval Moderate Complexity: 1 Mod PT Treatments $Therapeutic Activity: 8-22 mins        Moishe Spice, PT, DPT Acute Rehabilitation Services  Office: [phone]   Orvan Falconer 07/22/2022, 3:56 PM

## 2022-07-22 NOTE — Progress Notes (Signed)
SATURATION QUALIFICATIONS: (This note is used to comply with regulatory documentation for home oxygen)  Patient Saturations on Room Air at Rest = 90%  Patient Saturations on Room Air while Ambulating = 87%  Patient Saturations on 2 Liters of oxygen while Ambulating = 92%  Please briefly explain why patient needs home oxygen: Pt is requiring 2L of supplemental O2 to maintain sats >/= 92% when ambulating.   Moishe Spice, PT, DPT Acute Rehabilitation Services  Office: [phone]

## 2022-07-22 NOTE — Evaluation (Signed)
Clinical/Bedside Swallow Evaluation Patient Details  Name: Stephanie Cortez MRN: [id number] Date of Birth: July 29, 1933  Today's Date: 07/22/2022 Time: SLP Start Time (ACUTE ONLY): 1155 SLP Stop Time (ACUTE ONLY): 1225 SLP Time Calculation (min) (ACUTE ONLY): 30 min  Past Medical History:  Past Medical History:  Diagnosis Date   Anemia    - Hgb 9.7gm% on 07/13/2008 in [place] 129gm% wiht normal irone levsl and ferritin 10/27/2008 in [place] Recurrent otitis/sinusitis   Anxiety    chronic BZ prn   Atrial fibrillation (HCC)    chronic anticoag   Bronchiectasis    >PFT 07/13/2008 in [place] 1.9L/76%, FVC 2.45L/74, Ratio 79, TLC 121%, DLCO 64%  AE BRonchiectasis - Dec 2010.New Rx:  outpatient - Feb 2011 - Rx outpatient   CHF (congestive heart failure) ([place])    COPD (chronic obstructive pulmonary disease) (HCC)    bronchiectasis   Cricopharyngeal achalasia    Depression    Diverticulosis    Dyslipidemia    Eczema    Fatty liver    GERD with stricture    Glaucoma    H. 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[place])    Ventral hernia    Vertigo    Wears glasses    Past Surgical History:  Past Surgical History:  Procedure Laterality Date   BOWEL RESECTION N/A 02/08/2020   Procedure: SMALL BOWEL RESECTION;  Surgeon: Donnie Mesa, MD;  Location: [facility];  Service: General;  Laterality: N/A;   BREAST SURGERY     br bx   CARDIAC CATHETERIZATION  07/01/2013   CATARACT EXTRACTION     both   COLONOSCOPY     ESOPHAGOSCOPY W/ BOTOX INJECTION  12/11/2011   Procedure: ESOPHAGOSCOPY WITH BOTOX INJECTION;  Surgeon: Rozetta Nunnery, MD;  Location: [facility];  Service: ENT;  Laterality: N/A;  esophogoscopy with dilation, botox injection   FOOT SURGERY  03/14/2011   gastroc slide-rt   GASTROSTOMY N/A  02/08/2020   Procedure: INSERTION OF GASTROSTOMY TUBE;  Surgeon: Donnie Mesa, MD;  Location: [facility];  Service: General;  Laterality: N/A;   HIP ARTHROPLASTY Right 10/28/2019   Procedure: ARTHROPLASTY BIPOLAR HIP (HEMIARTHROPLASTY);  Surgeon: Marybelle Killings, MD;  Location: [facility];  Service: Orthopedics;  Laterality: Right;   LAPAROTOMY N/A 02/08/2020   Procedure: EXPLORATORY LAPAROTOMY;  Surgeon: Donnie Mesa, MD;  Location: [facility];  Service: General;  Laterality: N/A;   LYSIS OF ADHESION N/A 02/08/2020   Procedure: LYSIS OF ADHESIONS;  Surgeon: Donnie Mesa, MD;  Location: [facility];  Service: General;  Laterality: N/A;   TOTAL ABDOMINAL HYSTERECTOMY     TOTAL HIP REVISION Right 11/16/2019   Procedure: TOTAL HIP REVISION BIPOLAR TO CEMENTED BIPOLAR;  Surgeon: Marybelle Killings, MD;  Location: [facility];  Service: Orthopedics;  Laterality: Right;   WISDOM TOOTH EXTRACTION     HPI:  89 y.o. female  with past medical history of longstanding cricopharyngeal achalasia (successful treatment previously with Botulinin toxin), ILD, permanent A-fib (Eliquis), HFpEF, HTN, hypothyroidism admitted on 07/20/2022 with weakness.  Patient was scheduled for follow-up with up with Dr. Chase Caller on 1/19.  However, that morning patient was too weak for outpatient visit and was subsequently transferred to [facility] ED by EMS.  On admission, patient tested positive for RSV infection.  She is being treated with a steroid taper, Brovana/Pulmicort, DuoNebs, guaifenesin, frequent flutter valve usage, and IV Lasix.    Assessment / Plan / Recommendation  Clinical Impression  Patient presents with a suspected primary esophageal dysphagia characterized by c/o globus (constant with and without pos), frequent regurgitation of bolus, and throat clearing with pos. This is consistent with h/o cricopharyngeal achalasia although patient reports that it has become significantly worse as of late, complaining of primarily right sided globus. No definite  s/s of aspiraiton noted at bedside with one episode of strong coughing noted after all po trials complete. Baseline cough in the setting of RSV make bedside diagnostics more challenging. Po intake minimal raising concern for dehydration and malnutrition. Although she will require GI consult as well given history, recommend MBS while inpatient to evaluate upper edoscopic and pharyngeal swallowing physiology and determine if there are certain viscosity pos and/or compensatory strategies that would make swallowing more comfortable to maximize po intake for recovery. MBS planned for next date. SLP Visit Diagnosis: Dysphagia, unspecified (R13.10)    Aspiration Risk       Diet Recommendation Dysphagia 3 (Mech soft);Thin liquid   Liquid Administration via: Cup;Straw Medication Administration: Crushed with puree Supervision: Patient able to self feed;Full supervision/cueing for compensatory strategies Compensations: Slow rate;Small sips/bites;Follow solids with liquid Postural Changes: Remain upright for at least 30 minutes after po intake;Seated upright at 90 degrees    Other  Recommendations Recommended Consults: Consider GI evaluation;Consider esophageal assessment Oral Care Recommendations: Oral care BID    Recommendations for follow up therapy are one component of a multi-disciplinary discharge planning process, led by the attending physician.  Recommendations may be updated based on patient status, additional functional criteria and insurance authorization.  Follow up Recommendations  (TBD)        Swallow Study   General HPI: 89 y.o. female  with past medical history of longstanding cricopharyngeal achalasia (successful treatment previously with Botulinin toxin), ILD, permanent A-fib (Eliquis), HFpEF, HTN, hypothyroidism admitted on 07/20/2022 with weakness.  Patient was scheduled for follow-up with up with Dr. Chase Caller on 1/19.  However, that morning patient was too weak for outpatient visit  and was subsequently transferred to [facility] ED by EMS. On admission, patient tested positive for RSV infection.  She is being treated with a steroid taper, Brovana/Pulmicort, DuoNebs, guaifenesin, frequent flutter valve usage, and IV Lasix. Type of Study: Bedside Swallow Evaluation Previous Swallow Assessment: seen by SLP in 2022 Diet Prior to this Study: Regular;Thin liquids Temperature Spikes Noted: No Respiratory Status: Nasal cannula History of Recent Intubation: No Behavior/Cognition: Alert;Cooperative;Pleasant mood Oral Cavity Assessment: Within Functional Limits Oral Care Completed by SLP: No Oral Cavity - Dentition: Adequate natural dentition Vision: Functional for self-feeding Self-Feeding Abilities: Able to feed self Patient Positioning: Upright in bed Baseline Vocal Quality: Normal Volitional Cough: Strong Volitional Swallow: Able to elicit    Oral/Motor/Sensory Function Overall Oral Motor/Sensory Function: Within functional limits   Ice Chips Ice chips: Not tested   Thin Liquid Thin Liquid: Within functional limits Presentation: Cup;Self Fed;Straw    Nectar Thick Nectar Thick Liquid: Not tested   Honey Thick Honey Thick Liquid: Not tested   Puree Puree: Within functional limits Presentation: Self Fed;Spoon   Solid     Solid: Within functional limits Presentation: [place] MA, CCC-SLP  Josue Kass Meryl 07/22/2022,12:35 PM

## 2022-07-23 ENCOUNTER — Inpatient Hospital Stay (HOSPITAL_COMMUNITY): Payer: Medicare Other

## 2022-07-23 DIAGNOSIS — J9601 Acute respiratory failure with hypoxia: Secondary | ICD-10-CM | POA: Diagnosis not present

## 2022-07-23 LAB — GLUCOSE, CAPILLARY: Glucose-Capillary: 118 mg/dL — ABNORMAL HIGH (ref 70–99)

## 2022-07-23 NOTE — Progress Notes (Signed)
Objective Swallowing Evaluation: Type of Study: MBS-Modified Barium Swallow Study   Patient Details  Name: Stephanie Cortez MRN: [id number] Date of Birth: Feb 26, 1934  Today's Date: 07/23/2022 Time: SLP Start Time (ACUTE ONLY): 1310 -SLP Stop Time (ACUTE ONLY): 1340  SLP Time Calculation (min) (ACUTE ONLY): 30 min   Past Medical History:  Past Medical History:  Diagnosis Date   Anemia    - Hgb 9.7gm% on 07/13/2008 in [place] -  Hgg 129gm% wiht normal irone levsl and ferritin 10/27/2008 in [place] Recurrent otitis/sinusitis   Anxiety    chronic BZ prn   Atrial fibrillation (HCC)    chronic anticoag   Bronchiectasis    >PFT 07/13/2008 in [place] 1.9L/76%, FVC 2.45L/74, Ratio 79, TLC 121%, DLCO 64%  AE BRonchiectasis - Dec 2010.New Rx:  outpatient - Feb 2011 - Rx outpatient   CHF (congestive heart failure) ([place])    COPD (chronic obstructive pulmonary disease) (HCC)    bronchiectasis   Cricopharyngeal achalasia    Depression    Diverticulosis    Dyslipidemia    Eczema    Fatty liver    GERD with stricture    Glaucoma    H. 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[place])    Ventral hernia    Vertigo    Wears glasses    Past Surgical History:  Past Surgical History:  Procedure Laterality Date   BOWEL RESECTION N/A 02/08/2020   Procedure: SMALL BOWEL RESECTION;  Surgeon: Donnie Mesa, MD;  Location: [facility];  Service: General;  Laterality: N/A;   BREAST SURGERY     br bx   CARDIAC CATHETERIZATION  07/01/2013   CATARACT EXTRACTION     both   COLONOSCOPY     ESOPHAGOSCOPY W/ BOTOX INJECTION  12/11/2011   Procedure: ESOPHAGOSCOPY WITH BOTOX INJECTION;  Surgeon: Rozetta Nunnery, MD;  Location: [facility];  Service: ENT;  Laterality: N/A;  esophogoscopy with dilation, botox injection   FOOT SURGERY   03/14/2011   gastroc slide-rt   GASTROSTOMY N/A 02/08/2020   Procedure: INSERTION OF GASTROSTOMY TUBE;  Surgeon: Donnie Mesa, MD;  Location: [facility];  Service: General;  Laterality: N/A;   HIP ARTHROPLASTY Right 10/28/2019   Procedure: ARTHROPLASTY BIPOLAR HIP (HEMIARTHROPLASTY);  Surgeon: Marybelle Killings, MD;  [facility];  Service: Orthopedics;  Laterality: Right;   LAPAROTOMY N/A 02/08/2020   Procedure: EXPLORATORY LAPAROTOMY;  Surgeon: Donnie Mesa, MD;  Location: [facility];  Service: General;  Laterality: N/A;   LYSIS OF ADHESION N/A 02/08/2020   Procedure: LYSIS OF ADHESIONS;  Surgeon: Donnie Mesa, MD;  Location: [facility];  Service: General;  Laterality: N/A;   TOTAL ABDOMINAL HYSTERECTOMY     TOTAL HIP REVISION Right 11/16/2019   Procedure: TOTAL HIP REVISION BIPOLAR TO CEMENTED BIPOLAR;  Surgeon: Marybelle Killings, MD;  Location: [facility];  Service: Orthopedics;  Laterality: Right;   WISDOM TOOTH EXTRACTION     HPI: 89 y.o. female  with past medical history of longstanding cricopharyngeal achalasia (successful treatment previously with Botulinin toxin), ILD, permanent A-fib (Eliquis), HFpEF, HTN, hypothyroidism admitted on 07/20/2022 with weakness.  Patient was scheduled for follow-up with up with Dr. Chase Caller on 1/19.  However, that morning patient was too weak for outpatient visit  and was subsequently transferred to [facility] ED by EMS. On admission, patient tested positive for RSV infection.  She is being treated with a steroid taper, Brovana/Pulmicort, DuoNebs, guaifenesin, frequent flutter valve usage, and IV Lasix.   No data recorded   Recommendations for follow up therapy are one component of a multi-disciplinary discharge planning process, led by the attending physician.  Recommendations may be updated based on patient status, additional functional criteria and insurance authorization.  Assessment / Plan / Recommendation     07/23/2022    1:00 PM  Clinical Impressions  Clinical  Impression Patient presents with a normal oropharyngeal swallow. Timely and complete oral manipulate/mastication of all consistencies provided and swift swallow initiation noted. Patient does have what appear to be osteophytes located at C4-6 as well as a CP bar which protrude into esophageal space but did not impede clearance of bolus or result in any post swallow residue during todays study despite patient being challenged with multiple bites of regular texture solids and barium pill. Full airway protection noted. She also did not complain of globus during today's study and although cannot r/o presence of post swallow residue with dry or sticky solids, suspect that previous complaints of globus are related to delayed/decreased clearance of esophagus below this level. Given history and degree to which patient complaints are prohibiting po intake, continue to recommend GI f/u, with possible barium swallow to assess esophageal clearance. Educated patient and daughter (via phone). No SLP f/u indicated at this time.  SLP Visit Diagnosis Dysphagia, unspecified (R13.10)         07/23/2022    1:00 PM  Treatment Recommendations  Treatment Recommendations No treatment recommended at this time        06/23/2021    2:29 PM  Prognosis  Prognosis for Safe Diet Advancement Good       07/23/2022    1:00 PM  Diet Recommendations  SLP Diet Recommendations Regular solids;Thin liquid  Liquid Administration via Cup;Straw  Medication Administration Whole meds with liquid  Compensations Slow rate;Small sips/bites;Follow solids with liquid  Postural Changes Remain semi-upright after after feeds/meals (Comment);Seated upright at 90 degrees         07/23/2022    1:00 PM  Other Recommendations  Recommended Consults Consider GI evaluation;Consider esophageal assessment  Oral Care Recommendations Oral care BID  Follow Up Recommendations No SLP follow up       06/23/2021    2:29 PM  Frequency and Duration    Speech Therapy Frequency (ACUTE ONLY) min 1 x/week  Treatment Duration 1 week         07/23/2022    1:00 PM  Oral Phase  Oral Phase [facility]       07/23/2022    1:00 PM  Pharyngeal Phase  Pharyngeal Phase [facility]        07/23/2022    1:00 PM  Cervical Esophageal Phase   Cervical Esophageal Phase [facility]    Mikle Sternberg MA, CCC-SLP  Stephanie Cortez 07/23/2022, 2:04 PM

## 2022-07-23 NOTE — TOC Progression Note (Signed)
Transition of Care [facility]) - Progression Note    Patient Details  Name: Stephanie Cortez MRN: [id number] Date of Birth: [birth year]  Transition of Care [facility]) CM/SW Contact  Zenon Mayo, RN Phone Number: 07/23/2022, 12:13 PM  Clinical Narrative:    NCM offered choice to daughter , [name], she states she would like [facility].  NCM made referral to [facility]  Daughter states they will need a hospital bed, bedside table and oxygen.  She has a walker, and a raised toilet setat.  Address confirmed by daughter and she will need ambulance transport at dc.   Expected Discharge Plan: Home w Hospice Care Barriers to Discharge: Continued Medical Work up  Expected Discharge Plan and Services In-house Referral: Hospice / Palliative Care Discharge Planning Services: CM Consult Post Acute Care Choice: Hospice Living arrangements for the past 2 months: Single Family Home                 DME Arranged:  (Hospice will supply DME)         HH Arranged: RN [place] Agency: [facility] Date [place]: 07/23/22 Time [place]: 1211 Representative spoke with at [place]: [place] Determinants of Health ([place]) Interventions SDOH Screenings   Food Insecurity: No Food Insecurity (07/20/2022)  Housing: Low Risk  (07/20/2022)  Transportation Needs: No Transportation Needs (07/20/2022)  Utilities: Not At Risk (07/20/2022)  Depression (PHQ2-9): Low Risk  (07/06/2022)  Tobacco Use: Low Risk  (07/20/2022)    Readmission Risk Interventions    02/10/2020    4:21 PM  Readmission Risk Prevention Plan  Transportation Screening Complete  HRI or [place] Complete  Social Work Consult for [place] Planning/Counseling Complete  Palliative Care Screening Not Applicable  Medication Review Press photographer) Complete

## 2022-07-23 NOTE — Progress Notes (Signed)
This chaplain responded to PMT NP-Kathryn consult for creating/updating the Pt. Advance Directive after the Pt. swallow study. The chaplain is appreciative of the RN-Rylee updates.  The chaplain understands the Pt. intends to complete an AD and prefers to complete with the privacy of her family and with someone she trusts. The chaplain listened reflectively and provided education on how to complete an AD outside the hospital.  The Pt. daughter-[name] and son in law-Drew arrived at the end of the visit. The chaplain understands the family is requesting an update from PT to inform the Pt. of the next steps in medical care. The Pt. prefers to go home and is experiencing difficulty in discerning the importance of rehab. The Pt. accepted the chaplain's invitation to revisit Tuesday morning after the Pt. has had time to think about the best next steps.  Chaplain Sallyanne Kuster [phone]

## 2022-07-23 NOTE — Evaluation (Addendum)
Occupational Therapy Evaluation Patient Details Name: Stephanie Cortez MRN: [id number] DOB: [birth year] Today's Date: 07/23/2022   History of Present Illness Pt is an 89 y.o. female who presented 07/20/22 with dyspnea. Pt admitted with RSV infection, and acute exacerbation of chronic interstitial lung disease. PMH: anemia, afib, CHF, COPD, HTN, hypothyroid, bil feet neuropathy, vertigo   Clinical Impression   Pt reports using RW for mobility and daughter assists with IADLs and bathing (lives nearby). Pt currently needing mod I - mod A for ADLs, min A for bed mobility, and min guard-min A for transfers with RW. SpO2 down to 87% on RA while sitting EOB, reapplied 2L O2 for rest of session, SpO2 95% on 2L O2 at end of session. Pt presenting with impairments listed below, will follow acutely. Recommend SNF at d/c.  Addendum 07/24/2021 1153: It has been identified that pt does not have 24/7 support/assistance at home, have updated d/c recommendation to SNF.     Recommendations for follow up therapy are one component of a multi-disciplinary discharge planning process, led by the attending physician.  Recommendations may be updated based on patient status, additional functional criteria and insurance authorization.   Follow Up Recommendations  Skilled-nursing facility (<3hours/day)     Assistance Recommended at Discharge Frequent Supervision/Assistance  Patient can return home with the following Direct supervision/assist for medications management;Direct supervision/assist for financial management;Help with stairs or ramp for entrance;A little help with walking and/or transfers;A little help with bathing/dressing/bathroom;Assistance with cooking/housework    Functional Status Assessment  Patient has had a recent decline in their functional status and demonstrates the ability to make significant improvements in function in a reasonable and predictable amount of time.  Equipment Recommendations  None  recommended by OT (pt has all needed DME)    Recommendations for Other Services PT consult     Precautions / Restrictions Precautions Precautions: Fall;Other (comment) Precaution Comments: watch SpO2 Restrictions Weight Bearing Restrictions: No      Mobility Bed Mobility Overal bed mobility: Needs Assistance Bed Mobility: Supine to Sit     Supine to sit: Min assist, HOB elevated     General bed mobility comments: posterior lean    Transfers Overall transfer level: Needs assistance Equipment used: Rolling walker (2 wheels) Transfers: Sit to/from Stand Sit to Stand: Min assist                  Balance Overall balance assessment: Needs assistance Sitting-balance support: Feet supported, Single extremity supported, Bilateral upper extremity supported Sitting balance-Leahy Scale: Poor Sitting balance - Comments: posterior lean, needing UE's to support self on bed Postural control: Posterior lean Standing balance support: Bilateral upper extremity supported, During functional activity, Reliant on assistive device for balance Standing balance-Leahy Scale: Poor Standing balance comment: Reliant on RW and up to minA                           ADL either performed or assessed with clinical judgement   ADL Overall ADL's : Needs assistance/impaired Eating/Feeding: Modified independent   Grooming: Min guard   Upper Body Bathing: Minimal assistance   Lower Body Bathing: Moderate assistance   Upper Body Dressing : Minimal assistance   Lower Body Dressing: Moderate assistance   Toilet Transfer: Min guard;Rolling walker (2 wheels);Ambulation;Regular Toilet;Minimal assistance   Toileting- Clothing Manipulation and Hygiene: Min guard       Functional mobility during ADLs: Min guard;Rolling walker (2 wheels);Minimal assistance  Vision   Vision Assessment?: No apparent visual deficits     Perception Perception Perception Tested?: No   Praxis  Praxis Praxis tested?: Not tested    Pertinent Vitals/Pain Pain Assessment Pain Assessment: Faces Pain Score: 2  Faces Pain Scale: Hurts a little bit Pain Location: stomach Pain Descriptors / Indicators: Discomfort Pain Intervention(s): Limited activity within patient's tolerance, Monitored during session, Repositioned     Hand Dominance Right   Extremity/Trunk Assessment Upper Extremity Assessment Upper Extremity Assessment: Generalized weakness   Lower Extremity Assessment Lower Extremity Assessment: Defer to PT evaluation   Cervical / Trunk Assessment Cervical / Trunk Assessment: Kyphotic   Communication Communication Communication: No difficulties   Cognition Arousal/Alertness: Awake/alert Behavior During Therapy: WFL for tasks assessed/performed Overall Cognitive Status: Within Functional Limits for tasks assessed                                       General Comments  SpO2 down to 87% on RA while seated EOB, VSS on 2L O2 during session    Exercises     Shoulder Instructions      Home Living Family/patient expects to be discharged to:: Private residence Living Arrangements: Alone Available Help at Discharge: Family;Available PRN/intermittently Type of Home: House Home Access: Stairs to enter [employer] of Steps: 2-3 Entrance Stairs-Rails: Can reach both Home Layout: One level     Bathroom Shower/Tub: Occupational psychologist: Standard (with toilet riser) Bathroom Accessibility: Yes How Accessible: Accessible via walker Home Equipment: Shower seat;Hand held shower head;Grab bars - tub/shower;Grab bars - toilet;Toilet riser;Rolling Walker (2 wheels);Wheelchair - Scientist, physiological: Reacher;Sock aid Additional Comments: Daughter comes by daily; may be able to hire more around the clock assistance; does not wear O2 at baseline      Prior Functioning/Environment Prior Level of Function : Needs  assist             Mobility Comments: Mod I using RW. No falls in past 6 months. ADLs Comments: Daughter assists with bathing. She hires someone to clean. Daughter does cooking and driving.        OT Problem List: Decreased strength;Decreased range of motion;Decreased activity tolerance;Impaired balance (sitting and/or standing);Decreased safety awareness;Cardiopulmonary status limiting activity      OT Treatment/Interventions: Self-care/ADL training;Therapeutic exercise;Energy conservation;DME and/or AE instruction;Therapeutic activities;Patient/family education;Balance training    OT Goals(Current goals can be found in the care plan section) Acute Rehab OT Goals Patient Stated Goal: none stated OT Goal Formulation: With patient Time For Goal Achievement: 08/06/22 Potential to Achieve Goals: Good ADL Goals Pt Will Perform Upper Body Dressing: with supervision;sitting Pt Will Perform Lower Body Dressing: with supervision;sitting/lateral leans;sit to/from stand Pt Will Transfer to Toilet: with supervision;ambulating;regular height toilet Pt Will Perform Tub/Shower Transfer: with supervision;Tub transfer;Shower transfer;ambulating;shower seat;rolling walker  OT Frequency: Min 2X/week    Co-evaluation              AM-PAC OT "6 Clicks" Daily Activity     Outcome Measure Help from another person eating meals?: None Help from another person taking care of personal grooming?: A Little Help from another person toileting, which includes using toliet, bedpan, or urinal?: A Little Help from another person bathing (including washing, rinsing, drying)?: A Lot Help from another person to put on and taking off regular upper body clothing?: A Little Help from another person to put on and taking off  regular lower body clothing?: A Lot 6 Click Score: 17   End of Session Equipment Utilized During Treatment: Gait belt;Rolling walker (2 wheels);Oxygen (2L) Nurse Communication: Mobility  status (SpO2 drop)  Activity Tolerance: Patient tolerated treatment well Patient left: in chair;with call bell/phone within reach;with chair alarm set  OT Visit Diagnosis: Unsteadiness on feet (R26.81);Other abnormalities of gait and mobility (R26.89);Muscle weakness (generalized) (M62.81)                Time: 1173-5670 OT Time Calculation (min): 27 min Charges:  OT General Charges $OT Visit: 1 Visit OT Evaluation $OT Eval Moderate Complexity: 1 Mod OT Treatments $Self Care/Home Management : 8-22 mins  Renaye Rakers, OTD, OTR/L SecureChat Preferred Acute Rehab [phone]  Ulla Gallo 07/23/2022, 8:33 AM

## 2022-07-23 NOTE — Progress Notes (Signed)
PROGRESS NOTE    Stephanie Cortez  GEX:[id number] DOB: [birth year] DOA: 07/20/2022 PCP: Marin Olp, MD    Brief Narrative:  89 y.o. female with medical history significant for ILD, permanent A-fib on Eliquis, HFpEF, HTN, hypothyroidism who presented to the ED for evaluation of dyspnea. Patient does have ongoing symptoms for about more than 3 months now with progressive lethargy and exertional dyspnea.  Symptoms worse.  2 weeks.  Use different cough medications as outpatient without much improvement.  She was supposed to be seen at pulmonary office but at home she was found almost incoherent so EMS was called.  She was found 77% on room air by EMS.  Put on nonrebreather and brought to the ER. In the emergency room, able to wean off on 2 to 3 L of oxygen.  Electrolytes are stable.  Chest x-ray showed severe chronic interstitial lung disease with upper lobe predominance slightly increased from previous exam.  Also seen by critical care.  Admitted.  Respiratory virus panel was positive for RSV.   Assessment & Plan:   Acute exacerbation of chronic interstitial lung disease, acute hypoxemic respiratory failure. Acute RSV infection  Aggressive bronchodilator therapy, IV steroids to oral steroids today, inhalational steroids, scheduled and as needed bronchodilators, deep breathing exercises, incentive spirometry, chest physiotherapy.  Mucolytic's. Sputum cultures, Legionella and streptococcal antigen.  Blood cultures.  Negative so far. Initially on antibiotics that was discontinued. Supplemental oxygen to keep saturations more than 90%. PT/OT/speech.  Followed by pulmonary.  Some clinical improvement but he still struggles with mobility. Continue to work to improve mobility today.  Home with 2 L of oxygen.  Home with home health PT OT.   Chronic medical issues including Chronic A-fib, stable.  Therapeutic on Eliquis.  Not on any rate control medications. Heart failure with preserved  ejection fraction, euvolemic. Hypokalemia, replaced. Hyperlipidemia, on statin. Depression, on Prozac. Hypothyroidism, on Synthroid.  Hospital-acquired delirium: Patient had mild delirium last night but resolved with reorientation and sleep with trazodone.  Fall precautions.  Delirium precautions.  Improved.  Goal of care: Severe and chronic interstitial lung disease with exacerbation in a patient with severe frailty and debility carries poor prognosis.  Seen by palliative.  DNR. Detailed discussion about palliative and hospice at home for her chronic lung disease.  Patient and family agreeable to referral to home hospice programs through [facility] [place].  Case manager updated. Improve mobility today. Anticipate home with home health PT OT and subsequent follow-up with home hospice program for symptom management and additional support.   DVT prophylaxis: apixaban (ELIQUIS) tablet 2.5 mg Start: 07/20/22 2200 apixaban (ELIQUIS) tablet 2.5 mg   Code Status: DNR Family Communication: Daughter and son-in-law on the phone Disposition Plan: Status is: Inpatient Remains inpatient appropriate because: Respiratory failure, unsafe discharge.     Consultants:  PCCM Palliative care  Hospice.  Procedures:  None  Antimicrobials:  Rocephin 1/19--- 1/20.   Subjective:  Patient seen and examined.  Breathing better.  She was very nervous about walking.  Afebrile overnight.  Cough is controlled.  On 2 L of oxygen. She does not want to go to any other facility but to go home. Detailed discussion with patient, patient's daughter and son-in-law on the phone about medication management, possible home with home health therapies and hospice philosophy.  Patient was agreeable to discuss with hospice provider to get more education about hospice at home.  Objective: Vitals:   07/23/22 0037 07/23/22 0413 07/23/22 0835 07/23/22 0921  BP:  131/85 124/73 122/68   Pulse: 92 88 90   Resp: '17 17 18    '$ Temp: 97.7 F (36.5 C) (!) 97.5 F (36.4 C) 97.7 F (36.5 C)   TempSrc: Oral Oral Oral   SpO2: 98% 98% 96% 96%  Weight: 56.9 kg       Intake/Output Summary (Last 24 hours) at 07/23/2022 1114 Last data filed at 07/23/2022 0300 Gross per 24 hour  Intake --  Output 500 ml  Net -500 ml   Filed Weights   07/21/22 0429 07/22/22 0101 07/23/22 0037  Weight: 56.8 kg 56.7 kg 56.9 kg    Examination:  General: Thin and frail.  Chronically sick looking.  Not in any distress.  Able to talk in full sentences.  On 2 L oxygen. She is alert awake x 4.  Generalized weakness but no focal deficits. Cardiovascular: S1-S2 normal.  Regular rate rhythm. Respiratory: Not in any distress.  No accessory muscle use.  On 2 L oxygen.  Some conducted upper airway sounds but no other added sounds.  Poor inspiratory efforts. Gastrointestinal: Soft.  Nontender.  Bowel sound present. Ext: No swelling or edema.  No cyanosis.  No deformities.    Data Reviewed: I have personally reviewed following labs and imaging studies  CBC: Recent Labs  Lab 07/20/22 1430 07/21/22 0044  WBC 6.1 3.9*  HGB 11.8* 10.9*  HCT 37.7 34.2*  MCV 89.1 88.4  PLT 322 382   Basic Metabolic Panel: Recent Labs  Lab 07/20/22 1430 07/21/22 0044  NA 136 138  K 3.3* 4.1  CL 101 99  CO2 27 27  GLUCOSE 115* 139*  BUN 6* 6*  CREATININE 0.54 0.63  CALCIUM 9.0 8.7*  MG  --  1.9   GFR: Estimated Creatinine Clearance: 45.3 mL/min (by C-G formula based on SCr of 0.63 mg/dL). Liver Function Tests: No results for input(s): "AST", "ALT", "ALKPHOS", "BILITOT", "PROT", "ALBUMIN" in the last 168 hours. No results for input(s): "LIPASE", "AMYLASE" in the last 168 hours. No results for input(s): "AMMONIA" in the last 168 hours. Coagulation Profile: No results for input(s): "INR", "PROTIME" in the last 168 hours. Cardiac Enzymes: No results for input(s): "CKTOTAL", "CKMB", "CKMBINDEX", "TROPONINI" in the last 168 hours. BNP (last 3  results) No results for input(s): "PROBNP" in the last 8760 hours. HbA1C: No results for input(s): "HGBA1C" in the last 72 hours. CBG: No results for input(s): "GLUCAP" in the last 168 hours. Lipid Profile: No results for input(s): "CHOL", "HDL", "LDLCALC", "TRIG", "CHOLHDL", "LDLDIRECT" in the last 72 hours. Thyroid Function Tests: No results for input(s): "TSH", "T4TOTAL", "FREET4", "T3FREE", "THYROIDAB" in the last 72 hours. Anemia Panel: No results for input(s): "VITAMINB12", "FOLATE", "FERRITIN", "TIBC", "IRON", "RETICCTPCT" in the last 72 hours. Sepsis Labs: Recent Labs  Lab 07/20/22 1430  PROCALCITON <0.10    Recent Results (from the past 240 hour(s))  Resp panel by RT-PCR (RSV, Flu A&B, Covid) Anterior Nasal Swab     Status: Abnormal   Collection Time: 07/20/22  3:04 PM   Specimen: Anterior Nasal Swab  Result Value Ref Range Status   SARS Coronavirus 2 by RT PCR NEGATIVE NEGATIVE Final    Comment: (NOTE) SARS-CoV-2 target nucleic acids are NOT DETECTED.  The SARS-CoV-2 RNA is generally detectable in upper respiratory specimens during the acute phase of infection. The lowest concentration of SARS-CoV-2 viral copies this assay can detect is 138 copies/mL. A negative result does not preclude SARS-Cov-2 infection and should not be used as the sole basis  for treatment or other patient management decisions. A negative result may occur with  improper specimen collection/handling, submission of specimen other than nasopharyngeal swab, presence of viral mutation(s) within the areas targeted by this assay, and inadequate number of viral copies(<138 copies/mL). A negative result must be combined with clinical observations, patient history, and epidemiological information. The expected result is Negative.  Fact Sheet for Patients:  EntrepreneurPulse.com.au  Fact Sheet for Healthcare Providers:  IncredibleEmployment.be  This test is no t  yet approved or cleared by the [place] FDA and  has been authorized for detection and/or diagnosis of SARS-CoV-2 by FDA under an Emergency Use Authorization (EUA). This EUA will remain  in effect (meaning this test can be used) for the duration of the COVID-19 declaration under Section 564(b)(1) of the Act, 21 U.S.C.section 360bbb-3(b)(1), unless the authorization is terminated  or revoked sooner.       Influenza A by PCR NEGATIVE NEGATIVE Final   Influenza B by PCR NEGATIVE NEGATIVE Final    Comment: (NOTE) The Xpert Xpress SARS-CoV-2/FLU/RSV plus assay is intended as an aid in the diagnosis of influenza from Nasopharyngeal swab specimens and should not be used as a sole basis for treatment. Nasal washings and aspirates are unacceptable for Xpert Xpress SARS-CoV-2/FLU/RSV testing.  Fact Sheet for Patients: EntrepreneurPulse.com.au  Fact Sheet for Healthcare Providers: IncredibleEmployment.be  This test is not yet approved or cleared by the [place] FDA and has been authorized for detection and/or diagnosis of SARS-CoV-2 by FDA under an Emergency Use Authorization (EUA). This EUA will remain in effect (meaning this test can be used) for the duration of the COVID-19 declaration under Section 564(b)(1) of the Act, 21 U.S.C. section 360bbb-3(b)(1), unless the authorization is terminated or revoked.     Resp Syncytial Virus by PCR POSITIVE (A) NEGATIVE Final    Comment: (NOTE) Fact Sheet for Patients: EntrepreneurPulse.com.au  Fact Sheet for Healthcare Providers: IncredibleEmployment.be  This test is not yet approved or cleared by the [place] FDA and has been authorized for detection and/or diagnosis of SARS-CoV-2 by FDA under an Emergency Use Authorization (EUA). This EUA will remain in effect (meaning this test can be used) for the duration of the COVID-19 declaration under Section  564(b)(1) of the Act, 21 U.S.C. section 360bbb-3(b)(1), unless the authorization is terminated or revoked.  Performed at [facility], [place] [address], , [place] [address]   Respiratory (~20 pathogens) panel by PCR     Status: Abnormal   Collection Time: 07/20/22  3:04 PM   Specimen: Nasopharyngeal Swab; Respiratory  Result Value Ref Range Status   Adenovirus NOT DETECTED NOT DETECTED Final   Coronavirus 229E NOT DETECTED NOT DETECTED Final    Comment: (NOTE) The Coronavirus on the Respiratory Panel, DOES NOT test for the novel  Coronavirus (2019 nCoV)    Coronavirus HKU1 NOT DETECTED NOT DETECTED Final   Coronavirus NL63 NOT DETECTED NOT DETECTED Final   Coronavirus OC43 NOT DETECTED NOT DETECTED Final   Metapneumovirus NOT DETECTED NOT DETECTED Final   Rhinovirus / Enterovirus NOT DETECTED NOT DETECTED Final   Influenza A NOT DETECTED NOT DETECTED Final   Influenza B NOT DETECTED NOT DETECTED Final   Parainfluenza Virus 1 NOT DETECTED NOT DETECTED Final   Parainfluenza Virus 2 NOT DETECTED NOT DETECTED Final   Parainfluenza Virus 3 NOT DETECTED NOT DETECTED Final   Parainfluenza Virus 4 NOT DETECTED NOT DETECTED Final   Respiratory Syncytial Virus DETECTED (A) NOT DETECTED Final  Bordetella pertussis NOT DETECTED NOT DETECTED Final   Bordetella Parapertussis NOT DETECTED NOT DETECTED Final   Chlamydophila pneumoniae NOT DETECTED NOT DETECTED Final   Mycoplasma pneumoniae NOT DETECTED NOT DETECTED Final    Comment: Performed at [facility], [place] [address] [place], [place] [address]  Expectorated Sputum Assessment w Gram Stain, Rflx to Resp Cult     Status: None   Collection Time: 07/20/22  4:50 PM   Specimen: Expectorated Sputum  Result Value Ref Range Status   Specimen Description EXPECTORATED SPUTUM  Final   Special Requests NONE  Final   Sputum evaluation   Final    Sputum specimen not acceptable for testing.  Please recollect.   NOTIFIED RN LINDSEY  LEGO ON 07/20/22 @ 2239 BY DRT Performed at [facility], [place] [address], [place], [place] [address]    Report Status 07/20/2022 FINAL  Final  Culture, blood (Routine X 2) w Reflex to ID Panel     Status: None (Preliminary result)   Collection Time: 07/20/22  6:24 PM   Specimen: BLOOD  Result Value Ref Range Status   Specimen Description BLOOD BLOOD RIGHT FOREARM  Final   Special Requests   Final    BOTTLES DRAWN AEROBIC AND ANAEROBIC Blood Culture results may not be optimal due to an excessive volume of blood received in culture bottles   Culture   Final    NO GROWTH 3 DAYS Performed at [facility], [place] [address] [place], [place] [address]    Report Status PENDING  Incomplete  Culture, blood (Routine X 2) w Reflex to ID Panel     Status: None (Preliminary result)   Collection Time: 07/20/22 10:58 PM   Specimen: BLOOD  Result Value Ref Range Status   Specimen Description BLOOD LEFT ANTECUBITAL  Final   Special Requests   Final    BOTTLES DRAWN AEROBIC AND ANAEROBIC Blood Culture adequate volume   Culture   Final    NO GROWTH 3 DAYS Performed at [facility], [place] [address] [place], [place] [address]    Report Status PENDING  Incomplete         Radiology Studies: No results found.      Scheduled Meds:  apixaban  2.5 mg Oral BID   arformoterol  15 mcg Nebulization BID   budesonide (PULMICORT) nebulizer solution  0.5 mg Nebulization BID   feeding supplement  237 mL Oral BID BM   FLUoxetine  20 mg Oral Daily   guaiFENesin  600 mg Oral BID   levothyroxine  50 mcg Oral Daily   pravastatin  40 mg Oral QHS   predniSONE  50 mg Oral Q breakfast   sodium chloride flush  3 mL Intravenous Q12H   Continuous Infusions:     LOS: 3 days    Time spent: 35 minutes    Barb Merino, MD [facility] Pager [phone]

## 2022-07-23 NOTE — TOC Progression Note (Signed)
Transition of Care [facility]) - Progression Note    Patient Details  Name: Stephanie Cortez MRN: [id number] Date of Birth: 01-09-1934  Transition of [facility]) CM/SW [place], RN Phone Number: 07/23/2022, 5:17 PM  Clinical Narrative:     Just spoke to daughter, [name], who is now requesting SNF at [place]. Joi, [place] notified.  [facility].   Expected Discharge Plan: Home w Hospice Care Barriers to Discharge: Continued Medical Work up  Expected Discharge Plan and Services In-house Referral: Hospice / Palliative Care Discharge Planning Services: CM Consult Post Acute Care Choice: Hospice Living arrangements for the past 2 months: Single Family Home                 DME Arranged:  (Hospice will supply DME)         HH Arranged: RN [place] Agency: [place] Date [place]: 07/23/22 Time [place]: 1211 Representative spoke with at [place]: [place] Determinants of Health ([place]) Interventions SDOH Screenings   Food Insecurity: No Food Insecurity (07/20/2022)  Housing: Low Risk  (07/20/2022)  Transportation Needs: No Transportation Needs (07/20/2022)  Utilities: Not At Risk (07/20/2022)  Depression (PHQ2-9): Low Risk  (07/06/2022)  Tobacco Use: Low Risk  (07/20/2022)    Readmission Risk Interventions    02/10/2020    4:21 PM  Readmission Risk Prevention Plan  Transportation Screening Complete  HRI or [place] Complete  Social Work Consult for [place] Planning/Counseling Complete  Palliative Care Screening Not Applicable  Medication Review Press photographer) Complete

## 2022-07-23 NOTE — Progress Notes (Signed)
Patient had some small streaked blood with sputum after coughing. MD notified. This RN noticed that she's coughing after taking her medicine and throwing up some water with few medicine on it. It was pravastatin, but the rest of night medicine and prn was able to take after I instruct her to take a deep breath and relax. Oral care done and educated her to use flutter valve while awake. RN let her on 90 degrees position and will adjust after 30 mins according to her preference before going to sleep.

## 2022-07-24 DIAGNOSIS — J9601 Acute respiratory failure with hypoxia: Secondary | ICD-10-CM | POA: Diagnosis not present

## 2022-07-24 LAB — CBC WITH DIFFERENTIAL/PLATELET
Abs Immature Granulocytes: 0.03 10*3/uL (ref 0.00–0.07)
Basophils Absolute: 0 10*3/uL (ref 0.0–0.1)
Basophils Relative: 0 %
Eosinophils Absolute: 0 10*3/uL (ref 0.0–0.5)
Eosinophils Relative: 0 %
HCT: 34.3 % — ABNORMAL LOW (ref 36.0–46.0)
Hemoglobin: 10.9 g/dL — ABNORMAL LOW (ref 12.0–15.0)
Immature Granulocytes: 0 %
Lymphocytes Relative: 14 %
Lymphs Abs: 1 10*3/uL (ref 0.7–4.0)
MCH: 28.3 pg (ref 26.0–34.0)
MCHC: 31.8 g/dL (ref 30.0–36.0)
MCV: 89.1 fL (ref 80.0–100.0)
Monocytes Absolute: 0.6 10*3/uL (ref 0.1–1.0)
Monocytes Relative: 8 %
Neutro Abs: 6 10*3/uL (ref 1.7–7.7)
Neutrophils Relative %: 78 %
Platelets: 361 10*3/uL (ref 150–400)
RBC: 3.85 MIL/uL — ABNORMAL LOW (ref 3.87–5.11)
RDW: 14.8 % (ref 11.5–15.5)
WBC: 7.6 10*3/uL (ref 4.0–10.5)
nRBC: 0 % (ref 0.0–0.2)

## 2022-07-24 LAB — PHOSPHORUS: Phosphorus: 2.5 mg/dL (ref 2.5–4.6)

## 2022-07-24 LAB — COMPREHENSIVE METABOLIC PANEL
ALT: 16 U/L (ref 0–44)
AST: 24 U/L (ref 15–41)
Albumin: 2.6 g/dL — ABNORMAL LOW (ref 3.5–5.0)
Alkaline Phosphatase: 77 U/L (ref 38–126)
Anion gap: 6 (ref 5–15)
BUN: 17 mg/dL (ref 8–23)
CO2: 30 mmol/L (ref 22–32)
Calcium: 8.7 mg/dL — ABNORMAL LOW (ref 8.9–10.3)
Chloride: 98 mmol/L (ref 98–111)
Creatinine, Ser: 0.64 mg/dL (ref 0.44–1.00)
GFR, Estimated: >60 mL/min (ref >60)
Glucose, Bld: 106 mg/dL — ABNORMAL HIGH (ref 70–99)
Potassium: 3.9 mmol/L (ref 3.5–5.1)
Sodium: 134 mmol/L — ABNORMAL LOW (ref 135–145)
Total Bilirubin: 0.6 mg/dL (ref 0.3–1.2)
Total Protein: 6 g/dL — ABNORMAL LOW (ref 6.5–8.1)

## 2022-07-24 LAB — LEGIONELLA PNEUMOPHILA SEROGP 1 UR AG: L. pneumophila Serogp 1 Ur Ag: NEGATIVE

## 2022-07-24 LAB — ACID FAST SMEAR (AFB, MYCOBACTERIA): Acid Fast Smear: NEGATIVE

## 2022-07-24 LAB — MAGNESIUM: Magnesium: 2.3 mg/dL (ref 1.7–2.4)

## 2022-07-24 NOTE — TOC Initial Note (Signed)
Transition of Care [facility]) - Initial/Assessment Note    Patient Details  Name: Stephanie Cortez MRN: [id number] Date of Birth: 1933/12/26  [facility] CM/SW Contact:    Stephanie Pippin, LCSW Phone Number: 07/24/2022, 1:37 PM  Clinical Narrative:                 10:00 am CSW met with pt at bedside to clarify pt's goals when dc. Pt stated that she would like to return home with home health. Pt told CSW that she could relay this information to her daughter Stephanie Cortez.   CSW calls daughter to inform her that pt will dc home with home health per her request. Daughter expressed to CSW how that was not a safe dc plan and the pt also agreed to go to SNF yesterday. Daughter mentioned that the brother spoke with pt and pt has changed her mind again and would now like SNF.   12:30 CSW met with pt at bedside alongside daughter. Pt expressed that she has now changed her mind and would like to dc to SNF so she can gain her strength back. Pt expressed that she has previously gone to [facility] and would like to go back. CSW completed fl2 and faxed to [facility]. PT and OT notes have been updated. CSW will now start insurance auth. TOC will continue to follow.   Expected Discharge Plan: Skilled Nursing Facility Barriers to Discharge: Continued Medical Work up   Patient Goals and CMS Choice Patient states their goals for this hospitalization and ongoing recovery are:: Go to SNF to get stronger before returning home. CMS Medicare.gov Compare Post Acute Care list provided to:: Patient Represenative (must comment) Choice offered to / list presented to : Adult Children      Expected Discharge Plan and Services In-house Referral: Hospice / Palliative Care Discharge Planning Services: CM Consult Post Acute Care Choice: Hospice Living arrangements for the past 2 months: Single Family Home                 DME Arranged:  (Hospice will supply DME)         HH Arranged: RN [place] Agency: [place] Date [place]: 07/23/22 Time [place]: 1211 Representative spoke with at [place]: [place] Arrangements/Services Living arrangements for the past 2 months: [place] with:: Self Patient language and need for interpreter reviewed:: Yes Do you feel safe going back to the place where you live?: Yes      Need for Family Participation in Patient Care: Yes (Comment) Care giver support system in place?: Yes (comment)   Criminal Activity/Legal Involvement Pertinent to Current Situation/Hospitalization: No - Comment as needed  Activities of Daily Living Home Assistive Devices/Equipment: Gilford Rile (specify type) ADL Screening (condition at time of admission) Patient's cognitive ability adequate to safely complete daily activities?: Yes Is the patient deaf or have difficulty hearing?: No Does the patient have difficulty seeing, even when wearing glasses/contacts?: No Does the patient have difficulty concentrating, remembering, or making decisions?: No Patient able to express need for assistance with ADLs?: Yes Does the patient have difficulty dressing or bathing?: No Independently performs ADLs?: Yes (appropriate for developmental age) Does the patient have difficulty walking or climbing stairs?: No Weakness of Legs: None Weakness of Arms/Hands: None  Permission Sought/Granted                  Emotional Assessment Appearance:: Appears stated age Attitude/Demeanor/Rapport:  Engaged, Gracious Affect (typically observed): Accepting Orientation: : Oriented to Self, Oriented to Place, Oriented to  Time, Oriented to Situation Alcohol / Substance Use: Not Applicable Psych Involvement: No (comment)  Admission diagnosis:  RSV (respiratory syncytial virus infection) [B33.8] Hypoxia [R09.02] Acute respiratory failure with hypoxia ([place]) [J96.01] Patient Active Problem List   Diagnosis Date Noted   DNR (do not resuscitate) 07/21/2022    RSV (respiratory syncytial virus infection) 07/20/2022   Hyperlipidemia, unspecified 04/16/2022   Palliative care by specialist 07/27/2021   Constipation 07/27/2021   Cricopharyngeal achalasia 06/24/2021   Disorder of cervical esophageal region 06/23/2021   Acute respiratory failure with hypoxia (HCC) 06/18/2021   Chronic anemia 06/18/2021   COVID-19 virus infection 06/17/2021   Chronic pain of right knee 06/20/2020   S/P percutaneous endoscopic gastrostomy (PEG) tube placement ([place])    Debility 02/17/2020   Mesenteric ischemia (HCC)    Small bowel ischemia ([place]) 02/08/2020   Periprosthetic fracture around internal prosthetic hip joint 11/16/2019   Periprosthetic fracture around internal prosthetic hip joint, initial encounter    Essential hypertension    Chronic anticoagulation    Acute blood loss anemia    Subcapital fracture of femur ([place]) 10/30/2019   Leukocytosis    Closed right hip fracture, initial encounter ([place]) 10/27/2019   Hoarseness 02/19/2018   Sensorineural hearing loss (SNHL) of both ears 02/19/2018   Tinnitus, bilateral 02/19/2018   Cough 09/06/2017   Acute bronchitis 02/20/2016   Encounter for preoperative pulmonary examination 01/04/2016   NSIP (nonspecific interstitial pneumonia) ([place]) 09/26/2015   Chronic sinusitis 09/26/2015   ILD (interstitial lung disease) ([place]) 07/12/2014   Upper airway cough syndrome 09/23/2013   Heart failure with preserved ejection fraction ([place]) 07/08/2013   Coronary artery calcification seen on CAT scan 03/15/2013   Nodule of left lung 03/15/2013   Orthostatic lightheadedness 02/09/2013   Depression    Diverticulitis 01/01/2012   GERD (gastroesophageal reflux disease) 01/01/2012   Hypothyroid 11/23/2010   Edema 10/04/2010   Bronchiectasis ([place])    Permanent atrial fibrillation ([place])    PCP:  Marin Olp, MD Pharmacy:   [place], [place] [address] Phone: [phone] Fax: [phone]     Social Determinants of Health (SDOH) Social History: SDOH Screenings   Food Insecurity: No Food Insecurity (07/20/2022)  Housing: Low Risk  (07/20/2022)  Transportation Needs: No Transportation Needs (07/20/2022)  Utilities: Not At Risk (07/20/2022)  Depression (PHQ2-9): Low Risk  (07/06/2022)  Tobacco Use: Low Risk  (07/20/2022)   SDOH Interventions:     Readmission Risk Interventions    02/10/2020    4:21 PM  Readmission Risk Prevention Plan  Transportation Screening Complete  HRI or [place] Complete  Social Work Consult for [place] Planning/Counseling Complete  Palliative Care Screening Not Applicable  Medication Review Press photographer) Complete   Beckey Rutter, MSW, LCSWA, LCASA Transitions of Care  Clinical Social Worker I

## 2022-07-24 NOTE — Progress Notes (Signed)
Physical Therapy Treatment Patient Details Name: Stephanie Cortez MRN: [id number] DOB: 1934/01/04 Today's Date: 07/24/2022   History of Present Illness Pt is an 89 y.o. female who presented 07/20/22 with dyspnea. Pt admitted with RSV infection, and acute exacerbation of chronic interstitial lung disease. PMH: anemia, afib, CHF, COPD, HTN, hypothyroid, bil feet neuropathy, vertigo    PT Comments    Patient progressing with mobility though now needing STSNF as does not have coverage to have assistance at home.  She has new O2 requirement and would have to manage around O2 tubing as well as current physical state deconditioned from illness and hospitalization.  Feel she is appropriate for SNF prior to d/c home.  PT will continue to follow.    Recommendations for follow up therapy are one component of a multi-disciplinary discharge planning process, led by the attending physician.  Recommendations may be updated based on patient status, additional functional criteria and insurance authorization.  Follow Up Recommendations  Skilled nursing-short term rehab (<3 hours/day) Can patient physically be transported by private vehicle: Yes   Assistance Recommended at Discharge Frequent or constant Supervision/Assistance  Patient can return home with the following A little help with walking and/or transfers;A little help with bathing/dressing/bathroom;Assistance with cooking/housework;Assist for transportation;Help with stairs or ramp for entrance   Equipment Recommendations  None recommended by PT    Recommendations for Other Services       Precautions / Restrictions Precautions Precautions: Fall;Other (comment) Precaution Comments: watch SpO2     Mobility  Bed Mobility Overal bed mobility: Needs Assistance Bed Mobility: Supine to Sit     Supine to sit: Supervision, HOB elevated     General bed mobility comments: assist for safety, O2    Transfers Overall transfer level: Needs  assistance Equipment used: Rolling walker (2 wheels) Transfers: Sit to/from Stand Sit to Stand: From elevated surface, Min assist, Mod assist           General transfer comment: up from EOB with min A for balance and bed simulated height at home; from chair no armrests, mod A for lifting help to get weight over her feet and due to mainly on L LE with R hip ROM limitations    Ambulation/Gait Ambulation/Gait assistance: Min guard, Supervision Gait Distance (Feet): 65 Feet Assistive device: Rolling walker (2 wheels) Gait Pattern/deviations: Step-through pattern, Decreased stride length       General Gait Details: R foot pronation vs hip external rotation throughout   Stairs             Wheelchair Mobility    Modified Rankin (Stroke Patients Only)       Balance Overall balance assessment: Needs assistance Sitting-balance support: Feet supported Sitting balance-Leahy Scale: Fair Sitting balance - Comments: once in prep for sit to stand more anterior weight shift and no longer leaning on UE's   Standing balance support: Bilateral upper extremity supported, During functional activity, Reliant on assistive device for balance Standing balance-Leahy Scale: Poor Standing balance comment: Reliant on RW and up to [employer] Arousal/Alertness: Awake/alert Behavior During Therapy: [facility] for tasks assessed/performed Overall Cognitive Status: Within Functional Limits for tasks assessed  Exercises Other Exercises Other Exercises: standing minisquats x 7 Other Exercises: hip flexion standing x 5    General Comments General comments (skin integrity, edema, etc.): SpO2 on 2L O2 with ambulation 99%      Pertinent Vitals/Pain Pain Assessment Faces Pain Scale: Hurts a little bit Pain Location: R hip/chronic Pain Descriptors / Indicators: Tender Pain Intervention(s): Monitored  during session, Repositioned    Home Living                          Prior Function            PT Goals (current goals can now be found in the care plan section) Progress towards PT goals: Progressing toward goals    Frequency    Min 2X/week      PT Plan Discharge plan needs to be updated;Frequency needs to be updated    Co-evaluation              AM-PAC PT "6 Clicks" Mobility   Outcome Measure  Help needed turning from your back to your side while in a flat bed without using bedrails?: None Help needed moving from lying on your back to sitting on the side of a flat bed without using bedrails?: A Little Help needed moving to and from a bed to a chair (including a wheelchair)?: A Little Help needed standing up from a chair using your arms (e.g., wheelchair or bedside chair)?: A Little Help needed to walk in hospital room?: A Little Help needed climbing 3-5 steps with a railing? : Total 6 Click Score: 17    End of Session Equipment Utilized During Treatment: Gait belt;Oxygen Activity Tolerance: Patient limited by fatigue Patient left: with call bell/phone within reach;in chair;with family/visitor present   PT Visit Diagnosis: Unsteadiness on feet (R26.81);Other abnormalities of gait and mobility (R26.89);Muscle weakness (generalized) (M62.81);Difficulty in walking, not elsewhere classified (R26.2)     Time: 9242-6834 PT Time Calculation (min) (ACUTE ONLY): 30 min  Charges:  $Gait Training: 8-22 mins $Therapeutic Exercise: 8-22 mins                     Magda Kiel, PT Acute Rehabilitation Services Office:[phone] 07/24/2022    Reginia Naas 07/24/2022, 12:01 PM

## 2022-07-24 NOTE — Care Management Important Message (Signed)
Important Message  Patient Details  Name: Stephanie Cortez MRN: [id number] Date of Birth: 1934/04/25   Medicare Important Message Given:  Yes     [name] 07/24/2022, 7:53 AM

## 2022-07-24 NOTE — NC FL2 (Signed)
[place] MEDICAID FL2 LEVEL OF CARE FORM     IDENTIFICATION  Patient Name: Stephanie Cortez Birthdate: [birth year] Sex: female Admission Date (Current Location): 07/20/2022  [facility] and [place] Number:  Herbalist and Address:  [facility], [place] [address] [place], [place] [address]      Provider Number: [id number]  Attending Physician Name and Address:  Barb Merino, MD  Relative Name and Phone Number:       Current Level of Care: Hospital Recommended Level of Care: [place] Prior Approval Number:    Date Approved/Denied:   PASRR Number: [id number]  Discharge Plan: SNF    Current Diagnoses: Patient Active Problem List   Diagnosis Date Noted   DNR (do not resuscitate) 07/21/2022   RSV (respiratory syncytial virus infection) 07/20/2022   Hyperlipidemia, unspecified 04/16/2022   Palliative care by specialist 07/27/2021   Constipation 07/27/2021   Cricopharyngeal achalasia 06/24/2021   Disorder of cervical esophageal region 06/23/2021   Acute respiratory failure with hypoxia ([place]) 06/18/2021   Chronic anemia 06/18/2021   COVID-19 virus infection 06/17/2021   Chronic pain of right knee 06/20/2020   S/P percutaneous endoscopic gastrostomy (PEG) tube placement ([place])    Debility 02/17/2020   Mesenteric ischemia (HCC)    Small bowel ischemia ([place]) 02/08/2020   Periprosthetic fracture around internal prosthetic hip joint 11/16/2019   Periprosthetic fracture around internal prosthetic hip joint, initial encounter    Essential hypertension    Chronic anticoagulation    Acute blood loss anemia    Subcapital fracture of femur ([place]) 10/30/2019   Leukocytosis    Closed right hip fracture, initial encounter ([place]) 10/27/2019   Hoarseness 02/19/2018   Sensorineural hearing loss (SNHL) of both ears 02/19/2018   Tinnitus, bilateral 02/19/2018   Cough 09/06/2017   Acute bronchitis 02/20/2016   Encounter for preoperative  pulmonary examination 01/04/2016   NSIP (nonspecific interstitial pneumonia) ([place]) 09/26/2015   Chronic sinusitis 09/26/2015   ILD (interstitial lung disease) ([place]) 07/12/2014   Upper airway cough syndrome 09/23/2013   Heart failure with preserved ejection fraction ([place]) 07/08/2013   Coronary artery calcification seen on CAT scan 03/15/2013   Nodule of left lung 03/15/2013   Orthostatic lightheadedness 02/09/2013   Depression    Diverticulitis 01/01/2012   GERD (gastroesophageal reflux disease) 01/01/2012   Hypothyroid 11/23/2010   Edema 10/04/2010   Bronchiectasis (HCC)    Permanent atrial fibrillation (HCC)     Orientation RESPIRATION BLADDER Height & Weight     Self, Time, Situation, Place  O2 (2 liters) Continent, External catheter Weight: 125 lb 14.1 oz (57.1 kg) Height:     BEHAVIORAL SYMPTOMS/MOOD NEUROLOGICAL BOWEL NUTRITION STATUS      Continent Diet (See dc summary)  AMBULATORY STATUS COMMUNICATION OF NEEDS Skin   Limited Assist Verbally Normal                       Personal Care Assistance Level of Assistance  Bathing, Feeding, Dressing Bathing Assistance: Maximum assistance Feeding assistance: Limited assistance Dressing Assistance: Limited assistance     Functional Limitations Info  Sight, Hearing, Speech Sight Info: Adequate Hearing Info: Adequate Speech Info: Adequate    SPECIAL CARE FACTORS FREQUENCY                       Contractures Contractures Info: Not present    Additional Factors Info  Code Status, Allergies Code Status Info: DNR Allergies Info: Doxycycline  Tramadol  Vicodin (Hydrocodone-acetaminophen)  Flagyl (Metronidazole)  Moxifloxacin  Pantoprazole Sodium  Penicillins           Current Medications (07/24/2022):  This is the current hospital active medication list Current Facility-Administered Medications  Medication Dose Route Frequency Provider Last Rate Last Admin   acetaminophen (TYLENOL) tablet 650 mg  650 mg Oral  Q6H PRN Lenore Cordia, MD       Or   acetaminophen (TYLENOL) suppository 650 mg  650 mg Rectal Q6H PRN Lenore Cordia, MD       apixaban (ELIQUIS) tablet 2.5 mg  2.5 mg Oral BID Zada Finders R, MD   2.5 mg at 07/24/22 0817   arformoterol (BROVANA) nebulizer solution 15 mcg  15 mcg Nebulization BID Maryjane Hurter, MD   15 mcg at 07/24/22 0819   budesonide (PULMICORT) nebulizer solution 0.5 mg  0.5 mg Nebulization BID Maryjane Hurter, MD   0.5 mg at 07/24/22 3570   feeding supplement (ENSURE ENLIVE / ENSURE PLUS) liquid 237 mL  237 mL Oral BID BM Jordan Hawks, FNP   237 mL at 07/22/22 0939   FLUoxetine (PROZAC) capsule 20 mg  20 mg Oral Daily Zada Finders R, MD   20 mg at 07/24/22 0817   guaiFENesin (MUCINEX) 12 hr tablet 600 mg  600 mg Oral BID Maryjane Hurter, MD   600 mg at 07/24/22 0817   ipratropium-albuterol (DUONEB) 0.5-2.5 (3) MG/3ML nebulizer solution 3 mL  3 mL Nebulization Q4H PRN Barb Merino, MD       levothyroxine (SYNTHROID) tablet 50 mcg  50 mcg Oral Daily Zada Finders R, MD   50 mcg at 07/24/22 0654   polyvinyl alcohol (LIQUIFILM TEARS) 1.4 % ophthalmic solution 1 drop  1 drop Both Eyes PRN Barb Merino, MD   1 drop at 07/22/22 2154   pravastatin (PRAVACHOL) tablet 40 mg  40 mg Oral QHS Zada Finders R, MD   40 mg at 07/23/22 2226   predniSONE (DELTASONE) tablet 50 mg  50 mg Oral Q breakfast Margaretha Seeds, MD   50 mg at 07/24/22 1779   prochlorperazine (COMPAZINE) injection 10 mg  10 mg Intravenous Q6H PRN Lenore Cordia, MD       senna-docusate (Senokot-S) tablet 1 tablet  1 tablet Oral QHS PRN Lenore Cordia, MD   1 tablet at 07/23/22 2239   sodium chloride flush (NS) 0.9 % injection 3 mL  3 mL Intravenous Q12H Zada Finders R, MD   3 mL at 07/24/22 0824   traZODone (DESYREL) tablet 50 mg  50 mg Oral QHS PRN Etta Quill, DO   50 mg at 07/23/22 2239     Discharge Medications: Please see discharge summary for a list of discharge  medications.  Relevant Imaging Results:  Relevant Lab Results:   Additional Information SSN: [id number]  Beckey Rutter, MSW, LCSWA, LCASA Transitions of Care  Clinical Social Worker I

## 2022-07-24 NOTE — TOC Progression Note (Addendum)
Transition of Care [facility]) - Progression Note    Patient Details  Name: Stephanie Cortez MRN: [id number] Date of Birth: December 31, 1933  Transition of [facility]) CM/SW Contact  Zenon Mayo, RN Phone Number: 07/24/2022, 10:28 AM  Clinical Narrative:    NCM spoke with patient, she states she wants to go home with [facility] services now.  NCM informed MD of this information.  MD will call the daughter to talk to her about because patient states she can hire caretakers as well. If she goes home with [facility] she will need HHRN, HHPT, [place], and Education officer, museum. .  NCM offered choice to patient for [facility] she states she does not have a preference just one with good star rating.  NCM made referral to [facility] with [facility]. MD will get back with NCM to let regarding dispo.  Per MD , he spoke with daughter and they want her to go to SNF so they will speak with the patient because she lives by herself and does not really have 24 hr care.  NCM informed Cory with [place].   Expected Discharge Plan: Home w Hospice Care Barriers to Discharge: Continued Medical Work up  Expected Discharge Plan and Services In-house Referral: Hospice / Palliative Care Discharge Planning Services: CM Consult Post Acute Care Choice: Hospice Living arrangements for the past 2 months: Single Family Home                 DME Arranged:  (Hospice will supply DME)         HH Arranged: RN [place] Agency: [place] Date [place]: 07/23/22 Time [place]: 1211 Representative spoke with at [place]: [place] Determinants of Health ([place]) Interventions SDOH Screenings   Food Insecurity: No Food Insecurity (07/20/2022)  Housing: Low Risk  (07/20/2022)  Transportation Needs: No Transportation Needs (07/20/2022)  Utilities: Not At Risk (07/20/2022)  Depression (PHQ2-9): Low Risk  (07/06/2022)  Tobacco Use: Low Risk  (07/20/2022)    Readmission Risk Interventions    02/10/2020    4:21 PM  Readmission Risk  Prevention Plan  Transportation Screening Complete  HRI or [place] Complete  Social Work Consult for [place] Planning/Counseling Complete  Palliative Care Screening Not Applicable  Medication Review Press photographer) Complete

## 2022-07-24 NOTE — Progress Notes (Signed)
PROGRESS NOTE    Stephanie Cortez  JXB:[id number] DOB: 08-28-1933 DOA: 07/20/2022 PCP: Marin Olp, MD    Brief Narrative:  89 y.o. female with medical history significant for ILD, permanent A-fib on Eliquis, HFpEF, HTN, hypothyroidism who presented to the ED for evaluation of dyspnea. Patient does have ongoing symptoms for about more than 3 months now with progressive lethargy and exertional dyspnea.  Symptoms worse.  2 weeks.  Use different cough medications as outpatient without much improvement.  She was supposed to be seen at pulmonary office but at home she was found almost incoherent so EMS was called.  She was found 77% on room air by EMS.  Put on nonrebreather and brought to the ER. In the emergency room, able to wean off on 2 to 3 L of oxygen.  Electrolytes are stable.  Chest x-ray showed severe chronic interstitial lung disease with upper lobe predominance slightly increased from previous exam.  Also seen by critical care.  Admitted.  Respiratory virus panel was positive for RSV.   Assessment & Plan:   Acute exacerbation of chronic interstitial lung disease, acute hypoxemic respiratory failure. Acute RSV infection  Treated with aggressive bronchodilator therapy.  Was on IV steroids and currently remains on oral steroids.  Recommended gradual taper in 2 weeks.  inhalational steroids, scheduled and as needed bronchodilators, deep breathing exercises, incentive spirometry, chest physiotherapy. Mucolytic's. Sputum cultures, Legionella and streptococcal antigen.  Blood cultures.  Negative so far. Initially on antibiotics that was discontinued. Supplemental oxygen to keep saturations more than 90%. PT/OT/speech.  Previously seen by pulmonary. Some clinical improvement but he still struggles with mobility. Continue to work to improve mobility today.  Refer to SNF.  Chronic medical issues including Chronic A-fib, stable.  Therapeutic on Eliquis.  Not on any rate control  medications. Heart failure with preserved ejection fraction, euvolemic. Hypokalemia, replaced. Hyperlipidemia, on statin. Depression, on Prozac. Hypothyroidism, on Synthroid.  Hospital-acquired delirium: Patient had mild delirium last night but resolved with reorientation and sleep with trazodone.  Fall precautions.  Delirium precautions.  Improved.  Goal of care: Severe and chronic interstitial lung disease with exacerbation in a patient with severe frailty and debility carries poor prognosis. Seen by palliative.  DNR. Currently desires to regain some strength at a skilled nursing facility and ultimately go home with caretaker support.  She is open to home hospice support system in the future.   DVT prophylaxis: apixaban (ELIQUIS) tablet 2.5 mg Start: 07/20/22 2200 apixaban (ELIQUIS) tablet 2.5 mg   Code Status: DNR Family Communication: Daughter and son-in-law on the phone Disposition Plan: Status is: Inpatient Remains inpatient appropriate because: Respiratory failure, only stable to skilled level of care.     Consultants:  PCCM Palliative care  Hospice.  Procedures:  None  Antimicrobials:  Rocephin 1/19--- 1/20.   Subjective:  Patient seen and examined.  She was very anxious and withdrawn as her children discussed about going to a skilled nursing facility.  She tells me that she wants to spend as much time as she has left at home.  Detailed discussion with family and patient, currently does not have adequate support system and her decreased mobility is unsafe to discharge home.  Reluctantly agreeable for SNF. Still has some dry cough but breathing better than before as per patient.  Remains afebrile.  On 2 L oxygen.  Objective: Vitals:   07/23/22 0921 07/23/22 2002 07/23/22 2220 07/24/22 0415  BP:   136/88 129/83  Pulse:  88 96 96  Resp:  '18 20 20  '$ Temp:   (!) 97.5 F (36.4 C) (!) 97.5 F (36.4 C)  TempSrc:   Axillary Axillary  SpO2: 96% 98% 95% 97%  Weight:     57.1 kg    Intake/Output Summary (Last 24 hours) at 07/24/2022 1314 Last data filed at 07/24/2022 0600 Gross per 24 hour  Intake 200 ml  Output 300 ml  Net -100 ml    Filed Weights   07/22/22 0101 07/23/22 0037 07/24/22 0415  Weight: 56.7 kg 56.9 kg 57.1 kg    Examination:  General: Thin frail and debilitated.  Chronically sick looking.  Not in any distress.  Alert oriented x 4.  Profoundly debilitated but has equal strength. Cardiovascular: S1-S2 normal.  Regular rate rhythm. Respiratory: Bilateral inspiratory and expiratory crackles and scattered expiratory wheezes.  Not in distress.  On 2 L oxygen. Gastrointestinal: Soft.  Nontender.  Bowel sound present. Ext: No swelling.  No edema.  No cyanosis. Neuro: Alert.  Intact.   Data Reviewed: I have personally reviewed following labs and imaging studies  CBC: Recent Labs  Lab 07/20/22 1430 07/21/22 0044 07/24/22 0215  WBC 6.1 3.9* 7.6  NEUTROABS  --   --  6.0  HGB 11.8* 10.9* 10.9*  HCT 37.7 34.2* 34.3*  MCV 89.1 88.4 89.1  PLT 322 319 638    Basic Metabolic Panel: Recent Labs  Lab 07/20/22 1430 07/21/22 0044 07/24/22 0215  NA 136 138 134*  K 3.3* 4.1 3.9  CL 101 99 98  CO2 '27 27 30  '$ GLUCOSE 115* 139* 106*  BUN 6* 6* 17  CREATININE 0.54 0.63 0.64  CALCIUM 9.0 8.7* 8.7*  MG  --  1.9 2.3  PHOS  --   --  2.5    GFR: Estimated Creatinine Clearance: 45.5 mL/min (by C-G formula based on SCr of 0.64 mg/dL). Liver Function Tests: Recent Labs  Lab 07/24/22 0215  AST 24  ALT 16  ALKPHOS 77  BILITOT 0.6  PROT 6.0*  ALBUMIN 2.6*   No results for input(s): "LIPASE", "AMYLASE" in the last 168 hours. No results for input(s): "AMMONIA" in the last 168 hours. Coagulation Profile: No results for input(s): "INR", "PROTIME" in the last 168 hours. Cardiac Enzymes: No results for input(s): "CKTOTAL", "CKMB", "CKMBINDEX", "TROPONINI" in the last 168 hours. BNP (last 3 results) No results for input(s): "PROBNP" in  the last 8760 hours. HbA1C: No results for input(s): "HGBA1C" in the last 72 hours. CBG: Recent Labs  Lab 07/23/22 2149  GLUCAP 118*   Lipid Profile: No results for input(s): "CHOL", "HDL", "LDLCALC", "TRIG", "CHOLHDL", "LDLDIRECT" in the last 72 hours. Thyroid Function Tests: No results for input(s): "TSH", "T4TOTAL", "FREET4", "T3FREE", "THYROIDAB" in the last 72 hours. Anemia Panel: No results for input(s): "VITAMINB12", "FOLATE", "FERRITIN", "TIBC", "IRON", "RETICCTPCT" in the last 72 hours. Sepsis Labs: Recent Labs  Lab 07/20/22 1430  PROCALCITON <0.10     Recent Results (from the past 240 hour(s))  Resp panel by RT-PCR (RSV, Flu A&B, Covid) Anterior Nasal Swab     Status: Abnormal   Collection Time: 07/20/22  3:04 PM   Specimen: Anterior Nasal Swab  Result Value Ref Range Status   SARS Coronavirus 2 by RT PCR NEGATIVE NEGATIVE Final    Comment: (NOTE) SARS-CoV-2 target nucleic acids are NOT DETECTED.  The SARS-CoV-2 RNA is generally detectable in upper respiratory specimens during the acute phase of infection. The lowest concentration of SARS-CoV-2 viral copies this assay can detect is 138  copies/mL. A negative result does not preclude SARS-Cov-2 infection and should not be used as the sole basis for treatment or other patient management decisions. A negative result may occur with  improper specimen collection/handling, submission of specimen other than nasopharyngeal swab, presence of viral mutation(s) within the areas targeted by this assay, and inadequate number of viral copies(<138 copies/mL). A negative result must be combined with clinical observations, patient history, and epidemiological information. The expected result is Negative.  Fact Sheet for Patients:  EntrepreneurPulse.com.au  Fact Sheet for Healthcare Providers:  IncredibleEmployment.be  This test is no t yet approved or cleared by the [place] FDA and   has been authorized for detection and/or diagnosis of SARS-CoV-2 by FDA under an Emergency Use Authorization (EUA). This EUA will remain  in effect (meaning this test can be used) for the duration of the COVID-19 declaration under Section 564(b)(1) of the Act, 21 U.S.C.section 360bbb-3(b)(1), unless the authorization is terminated  or revoked sooner.       Influenza A by PCR NEGATIVE NEGATIVE Final   Influenza B by PCR NEGATIVE NEGATIVE Final    Comment: (NOTE) The Xpert Xpress SARS-CoV-2/FLU/RSV plus assay is intended as an aid in the diagnosis of influenza from Nasopharyngeal swab specimens and should not be used as a sole basis for treatment. Nasal washings and aspirates are unacceptable for Xpert Xpress SARS-CoV-2/FLU/RSV testing.  Fact Sheet for Patients: EntrepreneurPulse.com.au  Fact Sheet for Healthcare Providers: IncredibleEmployment.be  This test is not yet approved or cleared by the [place] FDA and has been authorized for detection and/or diagnosis of SARS-CoV-2 by FDA under an Emergency Use Authorization (EUA). This EUA will remain in effect (meaning this test can be used) for the duration of the COVID-19 declaration under Section 564(b)(1) of the Act, 21 U.S.C. section 360bbb-3(b)(1), unless the authorization is terminated or revoked.     Resp Syncytial Virus by PCR POSITIVE (A) NEGATIVE Final    Comment: (NOTE) Fact Sheet for Patients: EntrepreneurPulse.com.au  Fact Sheet for Healthcare Providers: IncredibleEmployment.be  This test is not yet approved or cleared by the [place] FDA and has been authorized for detection and/or diagnosis of SARS-CoV-2 by FDA under an Emergency Use Authorization (EUA). This EUA will remain in effect (meaning this test can be used) for the duration of the COVID-19 declaration under Section 564(b)(1) of the Act, 21 U.S.C. section 360bbb-3(b)(1),  unless the authorization is terminated or revoked.  Performed at [facility], [place] [address], [place], [place] [address]   Respiratory (~20 pathogens) panel by PCR     Status: Abnormal   Collection Time: 07/20/22  3:04 PM   Specimen: Nasopharyngeal Swab; Respiratory  Result Value Ref Range Status   Adenovirus NOT DETECTED NOT DETECTED Final   Coronavirus 229E NOT DETECTED NOT DETECTED Final    Comment: (NOTE) The Coronavirus on the Respiratory Panel, DOES NOT test for the novel  Coronavirus (2019 nCoV)    Coronavirus HKU1 NOT DETECTED NOT DETECTED Final   Coronavirus NL63 NOT DETECTED NOT DETECTED Final   Coronavirus OC43 NOT DETECTED NOT DETECTED Final   Metapneumovirus NOT DETECTED NOT DETECTED Final   Rhinovirus / Enterovirus NOT DETECTED NOT DETECTED Final   Influenza A NOT DETECTED NOT DETECTED Final   Influenza B NOT DETECTED NOT DETECTED Final   Parainfluenza Virus 1 NOT DETECTED NOT DETECTED Final   Parainfluenza Virus 2 NOT DETECTED NOT DETECTED Final   Parainfluenza Virus 3 NOT DETECTED NOT DETECTED Final   Parainfluenza Virus  4 NOT DETECTED NOT DETECTED Final   Respiratory Syncytial Virus DETECTED (A) NOT DETECTED Final   Bordetella pertussis NOT DETECTED NOT DETECTED Final   Bordetella Parapertussis NOT DETECTED NOT DETECTED Final   Chlamydophila pneumoniae NOT DETECTED NOT DETECTED Final   Mycoplasma pneumoniae NOT DETECTED NOT DETECTED Final    Comment: Performed at [facility], [place] [address], [place], [place] [address]  Expectorated Sputum Assessment w Gram Stain, Rflx to Resp Cult     Status: None   Collection Time: 07/20/22  4:50 PM   Specimen: Expectorated Sputum  Result Value Ref Range Status   Specimen Description EXPECTORATED SPUTUM  Final   Special Requests NONE  Final   Sputum evaluation   Final    Sputum specimen not acceptable for testing.  Please recollect.   NOTIFIED RN LINDSEY LEGO ON 07/20/22 @ 2239 BY DRT Performed at [facility], [place] [address] [place], [place] [address]    Report Status 07/20/2022 FINAL  Final  Culture, blood (Routine X 2) w Reflex to ID Panel     Status: None (Preliminary result)   Collection Time: 07/20/22  6:24 PM   Specimen: BLOOD  Result Value Ref Range Status   Specimen Description BLOOD BLOOD RIGHT FOREARM  Final   Special Requests   Final    BOTTLES DRAWN AEROBIC AND ANAEROBIC Blood Culture results may not be optimal due to an excessive volume of blood received in culture bottles   Culture   Final    NO GROWTH 4 DAYS Performed at [facility], [place] [address], [place], [place] [address]    Report Status PENDING  Incomplete  Culture, blood (Routine X 2) w Reflex to ID Panel     Status: None (Preliminary result)   Collection Time: 07/20/22 10:58 PM   Specimen: BLOOD  Result Value Ref Range Status   Specimen Description BLOOD LEFT ANTECUBITAL  Final   Special Requests   Final    BOTTLES DRAWN AEROBIC AND ANAEROBIC Blood Culture adequate volume   Culture   Final    NO GROWTH 4 DAYS Performed at [facility], [place] [address] [place], [place] [address]    Report Status PENDING  Incomplete         Radiology Studies: DG Swallowing Func-Speech Pathology  Result Date: 07/23/2022 Table formatting from the original result was not included. Objective Swallowing Evaluation: Type of Study: MBS-Modified Barium Swallow Study  Patient Details Name: COLIN NORMENT MRN: [id number] Date of Birth: 22-Jan-1934 Today's Date: 07/23/2022 Time: SLP Start Time (ACUTE ONLY): 1310 -SLP Stop Time (ACUTE ONLY): 1340 SLP Time Calculation (min) (ACUTE ONLY): 30 min Past Medical History: Past Medical History: Diagnosis Date  Anemia   - Hgb 9.7gm% on 07/13/2008 in [place] -  Hgg 129gm% wiht normal irone levsl and ferritin 10/27/2008 in [place] Recurrent otitis/sinusitis  Anxiety   chronic BZ prn  Atrial fibrillation (HCC)   chronic anticoag  Bronchiectasis   >PFT 07/13/2008 in [place] 1.9L/76%, FVC  2.45L/74, Ratio 79, TLC 121%, DLCO 64%  AE BRonchiectasis - Dec 2010.New Rx:  outpatient - Feb 2011 - Rx outpatient  CHF (congestive heart failure) (HCC)   COPD (chronic obstructive pulmonary disease) (HCC)   bronchiectasis  Cricopharyngeal achalasia   Depression   Diverticulosis   Dyslipidemia   Eczema   Fatty liver   GERD with stricture   Glaucoma   H. pylori infection   Hypertension   Hyponatremia  chronic, s/p endo eval 06/2012  Hypothyroid   IBS (irritable bowel syndrome)   Lumbar disc disease   Neuropathy of both feet   Segmental colitis ([place])   Ventral hernia   Vertigo   Wears glasses  Past Surgical History: Past Surgical History: Procedure Laterality Date  BOWEL RESECTION N/A 02/08/2020  Procedure: SMALL BOWEL RESECTION;  Surgeon: Donnie Mesa, MD;  Location: [place];  Service: General;  Laterality: N/A;  BREAST SURGERY    br bx  CARDIAC CATHETERIZATION  07/01/2013  CATARACT EXTRACTION    both  COLONOSCOPY    ESOPHAGOSCOPY W/ BOTOX INJECTION  12/11/2011  Procedure: ESOPHAGOSCOPY WITH BOTOX INJECTION;  Surgeon: Rozetta Nunnery, MD;  Location: [facility];  Service: ENT;  Laterality: N/A;  esophogoscopy with dilation, botox injection  FOOT SURGERY  03/14/2011  gastroc slide-rt  GASTROSTOMY N/A 02/08/2020  Procedure: INSERTION OF GASTROSTOMY TUBE;  Surgeon: Donnie Mesa, MD;  Location: [facility];  Service: General;  Laterality: N/A;  HIP ARTHROPLASTY Right 10/28/2019  Procedure: ARTHROPLASTY BIPOLAR HIP (HEMIARTHROPLASTY);  Surgeon: Marybelle Killings, MD;  Location: [facility];  Service: Orthopedics;  Laterality: Right;  LAPAROTOMY N/A 02/08/2020  Procedure: EXPLORATORY LAPAROTOMY;  Surgeon: Donnie Mesa, MD;  Location: [facility];  Service: General;  Laterality: N/A;  LYSIS OF ADHESION N/A 02/08/2020  Procedure: LYSIS OF ADHESIONS;  Surgeon: Donnie Mesa, MD;  Location: [place];  Service: General;  Laterality: N/A;  TOTAL ABDOMINAL HYSTERECTOMY    TOTAL HIP REVISION Right 11/16/2019  Procedure: TOTAL HIP REVISION  BIPOLAR TO CEMENTED BIPOLAR;  Surgeon: Marybelle Killings, MD;  Location: [facility];  Service: Orthopedics;  Laterality: Right;  WISDOM TOOTH EXTRACTION   HPI: 89 y.o. female  with past medical history of longstanding cricopharyngeal achalasia (successful treatment previously with Botulinin toxin), ILD, permanent A-fib (Eliquis), HFpEF, HTN, hypothyroidism admitted on 07/20/2022 with weakness.  Patient was scheduled for follow-up with up with Dr. Chase Caller on 1/19.  However, that morning patient was too weak for outpatient visit and was subsequently transferred to [facility] ED by EMS. On admission, patient tested positive for RSV infection.  She is being treated with a steroid taper, Brovana/Pulmicort, DuoNebs, guaifenesin, frequent flutter valve usage, and IV Lasix.  No data recorded  Recommendations for follow up therapy are one component of a multi-disciplinary discharge planning process, led by the attending physician.  Recommendations may be updated based on patient status, additional functional criteria and insurance authorization. Assessment / Plan / Recommendation   07/23/2022   1:00 PM Clinical Impressions Clinical Impression Patient presents with a normal oropharyngeal swallow. Timely and complete oral manipulate/mastication of all consistencies provided and swift swallow initiation noted. Patient does have what appear to be osteophytes located at C4-6 as well as a CP bar which protrude into esophageal space but did not impede clearance of bolus or result in any post swallow residue during todays study despite patient being challenged with multiple bites of regular texture solids and barium pill. Full airway protection noted. She also did not complain of globus during today's study and although cannot r/o presence of post swallow residue with dry or sticky solids, suspect that previous complaints of globus are related to delayed/decreased clearance of esophagus below this level. Given history and degree to which  patient complaints are prohibiting po intake, continue to recommend GI f/u, with possible barium swallow to assess esophageal clearance. Educated patient and daughter (via phone). No SLP f/u indicated at this time. SLP Visit Diagnosis Dysphagia, unspecified (R13.10)  07/23/2022   1:00 PM Treatment Recommendations Treatment Recommendations No treatment recommended at this time     06/23/2021   2:29 PM Prognosis Prognosis for Safe Diet Advancement Good   07/23/2022   1:00 PM Diet Recommendations SLP Diet Recommendations Regular solids;Thin liquid Liquid Administration via Cup;Straw Medication Administration Whole meds with liquid Compensations Slow rate;Small sips/bites;Follow solids with liquid Postural Changes Remain semi-upright after after feeds/meals (Comment);Seated upright at 90 degrees     07/23/2022   1:00 PM Other Recommendations Recommended Consults Consider GI evaluation;Consider esophageal assessment Oral Care Recommendations Oral care BID Follow Up Recommendations No SLP follow up   06/23/2021   2:29 PM Frequency and Duration  Speech Therapy Frequency (ACUTE ONLY) min 1 x/week Treatment Duration 1 week     07/23/2022   1:00 PM Oral Phase Oral Phase [facility]    07/23/2022   1:00 PM Pharyngeal Phase Pharyngeal Phase [facility]    07/23/2022   1:00 PM Cervical Esophageal Phase  Cervical Esophageal Phase [facility] Leah McCoy MA, CCC-SLP McCoy Leah Meryl 07/23/2022, 2:09 PM                          Scheduled Meds:  apixaban  2.5 mg Oral BID   arformoterol  15 mcg Nebulization BID   budesonide (PULMICORT) nebulizer solution  0.5 mg Nebulization BID   feeding supplement  237 mL Oral BID BM   FLUoxetine  20 mg Oral Daily   guaiFENesin  600 mg Oral BID   levothyroxine  50 mcg Oral Daily   pravastatin  40 mg Oral QHS   predniSONE  50 mg Oral Q breakfast   sodium chloride flush  3 mL Intravenous Q12H   Continuous Infusions:     LOS: 4 days    Time spent: 35 minutes    Barb Merino, MD [facility] Pager [phone]

## 2022-07-25 DIAGNOSIS — J9601 Acute respiratory failure with hypoxia: Secondary | ICD-10-CM | POA: Diagnosis not present

## 2022-07-25 LAB — CULTURE, BLOOD (ROUTINE X 2)
Culture: NO GROWTH
Culture: NO GROWTH
Special Requests: ADEQUATE

## 2022-07-25 MED ORDER — GUAIFENESIN-DM 100-10 MG/5ML PO SYRP: 5.0000 mL | ORAL_SOLUTION | ORAL | Status: DC | PRN

## 2022-07-25 MED ORDER — OXYCODONE HCL 5 MG PO TABS: 2.5000 mg | ORAL_TABLET | Freq: Four times a day (QID) | ORAL | Status: DC | PRN

## 2022-07-25 NOTE — Progress Notes (Signed)
PROGRESS NOTE    Stephanie Cortez  IOE:[id number] DOB: July 14, 1933 DOA: 07/20/2022 PCP: Marin Olp, MD    Brief Narrative:  89 y.o. female with medical history significant for ILD, permanent A-fib on Eliquis, HFpEF, HTN, hypothyroidism who presented to the ED for evaluation of dyspnea. Patient does have ongoing symptoms for about more than 3 months now with progressive lethargy and exertional dyspnea.  Symptoms worse.  2 weeks.  Use different cough medications as outpatient without much improvement.  She was supposed to be seen at pulmonary office but at home she was found almost incoherent so EMS was called.  She was found 77% on room air by EMS.  Put on nonrebreather and brought to the ER. In the emergency room, able to wean off on 2 to 3 L of oxygen.  Electrolytes are stable.  Chest x-ray showed severe chronic interstitial lung disease with upper lobe predominance slightly increased from previous exam.  Also seen by critical care.  Admitted.  Respiratory virus panel was positive for RSV.   Assessment & Plan:   Acute exacerbation of chronic interstitial lung disease, acute hypoxemic respiratory failure. Acute RSV infection  Treated with aggressive bronchodilator therapy.  Was on IV steroids and currently remains on oral steroids.  Recommended gradual taper in 2 weeks.  inhalational steroids, scheduled and as needed bronchodilators, deep breathing exercises, incentive spirometry, chest physiotherapy. Mucolytic's. Sputum cultures, Legionella and streptococcal antigen.  Blood cultures.  Negative so far. Initially on antibiotics that was discontinued. Supplemental oxygen to keep saturations more than 90%. PT/OT/speech.  Previously seen by pulmonary. Some clinical improvement but she still struggles with mobility. Continue to work to improve mobility.  Refer to SNF.  Chronic medical issues including Chronic A-fib, stable.  Therapeutic on Eliquis.  Not on any rate control  medications. Heart failure with preserved ejection fraction, euvolemic. Hypokalemia, replaced. Hyperlipidemia, on statin. Depression, on Prozac. Hypothyroidism, on Synthroid.  Hospital-acquired delirium: Improved.  Goal of care: Severe and chronic interstitial lung disease with exacerbation in a patient with severe frailty and debility carries poor prognosis. Seen by palliative.  DNR. Currently desires to regain some strength at a skilled nursing facility and ultimately go home with caretaker support.  She is open to home hospice support system in the future.   DVT prophylaxis: apixaban (ELIQUIS) tablet 2.5 mg Start: 07/20/22 2200 apixaban (ELIQUIS) tablet 2.5 mg   Code Status: DNR Family Communication: None today. Disposition Plan: Status is: Inpatient Remains inpatient appropriate because: Respiratory failure, only stable to skilled level of care.     Consultants:  PCCM Palliative care  Hospice.  Procedures:  None  Antimicrobials:  Rocephin 1/19--- 1/20.   Subjective:  Seen and examined.  Has some dry cough.  Seen working with occupational therapy.  Remains very debilitated, shaky and tremulous on walking.  On 2 L oxygen.  Agreed to go to SNF.  Objective: Vitals:   07/24/22 0810 07/24/22 1943 07/24/22 1951 07/25/22 0413  BP: 115/72  116/72 116/63  Pulse: 71  87 83  Resp: '20  19 18  '$ Temp: 97.6 F (36.4 C)  97.7 F (36.5 C) 98 F (36.7 C)  TempSrc: Oral  Oral Oral  SpO2: 100% 99% 99% 99%  Weight:    57.9 kg    Intake/Output Summary (Last 24 hours) at 07/25/2022 1138 Last data filed at 07/25/2022 0522 Gross per 24 hour  Intake --  Output 350 ml  Net -350 ml    Filed Weights   07/23/22 0037  07/24/22 0415 07/25/22 0413  Weight: 56.9 kg 57.1 kg 57.9 kg    Examination:  General: Thin.  Frail and debilitated.  Chronically sick looking.  Anxious with mobility and fearful of falling and losing balance. Alert oriented.  Generalized weakness but no focal  deficits. Cardiovascular: S1-S2 normal.  Regular rate rhythm. Respiratory: Bilateral coarse crackles, bilateral conducted upper airway sounds.  Expiratory wheezes present.  Not in any distress. Gastrointestinal: Soft.  Nontender.  Bowel sound present. Ext: No swelling or edema.  No cyanosis.   Data Reviewed: I have personally reviewed following labs and imaging studies  CBC: Recent Labs  Lab 07/20/22 1430 07/21/22 0044 07/24/22 0215  WBC 6.1 3.9* 7.6  NEUTROABS  --   --  6.0  HGB 11.8* 10.9* 10.9*  HCT 37.7 34.2* 34.3*  MCV 89.1 88.4 89.1  PLT 322 319 086    Basic Metabolic Panel: Recent Labs  Lab 07/20/22 1430 07/21/22 0044 07/24/22 0215  NA 136 138 134*  K 3.3* 4.1 3.9  CL 101 99 98  CO2 '27 27 30  '$ GLUCOSE 115* 139* 106*  BUN 6* 6* 17  CREATININE 0.54 0.63 0.64  CALCIUM 9.0 8.7* 8.7*  MG  --  1.9 2.3  PHOS  --   --  2.5    GFR: Estimated Creatinine Clearance: 46.1 mL/min (by C-G formula based on SCr of 0.64 mg/dL). Liver Function Tests: Recent Labs  Lab 07/24/22 0215  AST 24  ALT 16  ALKPHOS 77  BILITOT 0.6  PROT 6.0*  ALBUMIN 2.6*    No results for input(s): "LIPASE", "AMYLASE" in the last 168 hours. No results for input(s): "AMMONIA" in the last 168 hours. Coagulation Profile: No results for input(s): "INR", "PROTIME" in the last 168 hours. Cardiac Enzymes: No results for input(s): "CKTOTAL", "CKMB", "CKMBINDEX", "TROPONINI" in the last 168 hours. BNP (last 3 results) No results for input(s): "PROBNP" in the last 8760 hours. HbA1C: No results for input(s): "HGBA1C" in the last 72 hours. CBG: Recent Labs  Lab 07/23/22 2149  GLUCAP 118*    Lipid Profile: No results for input(s): "CHOL", "HDL", "LDLCALC", "TRIG", "CHOLHDL", "LDLDIRECT" in the last 72 hours. Thyroid Function Tests: No results for input(s): "TSH", "T4TOTAL", "FREET4", "T3FREE", "THYROIDAB" in the last 72 hours. Anemia Panel: No results for input(s): "VITAMINB12", "FOLATE",  "FERRITIN", "TIBC", "IRON", "RETICCTPCT" in the last 72 hours. Sepsis Labs: Recent Labs  Lab 07/20/22 1430  PROCALCITON <0.10     Recent Results (from the past 240 hour(s))  Resp panel by RT-PCR (RSV, Flu A&B, Covid) Anterior Nasal Swab     Status: Abnormal   Collection Time: 07/20/22  3:04 PM   Specimen: Anterior Nasal Swab  Result Value Ref Range Status   SARS Coronavirus 2 by RT PCR NEGATIVE NEGATIVE Final    Comment: (NOTE) SARS-CoV-2 target nucleic acids are NOT DETECTED.  The SARS-CoV-2 RNA is generally detectable in upper respiratory specimens during the acute phase of infection. The lowest concentration of SARS-CoV-2 viral copies this assay can detect is 138 copies/mL. A negative result does not preclude SARS-Cov-2 infection and should not be used as the sole basis for treatment or other patient management decisions. A negative result may occur with  improper specimen collection/handling, submission of specimen other than nasopharyngeal swab, presence of viral mutation(s) within the areas targeted by this assay, and inadequate number of viral copies(<138 copies/mL). A negative result must be combined with clinical observations, patient history, and epidemiological information. The expected result is Negative.  Fact Sheet for Patients:  EntrepreneurPulse.com.au  Fact Sheet for Healthcare Providers:  IncredibleEmployment.be  This test is no t yet approved or cleared by the [place] FDA and  has been authorized for detection and/or diagnosis of SARS-CoV-2 by FDA under an Emergency Use Authorization (EUA). This EUA will remain  in effect (meaning this test can be used) for the duration of the COVID-19 declaration under Section 564(b)(1) of the Act, 21 U.S.C.section 360bbb-3(b)(1), unless the authorization is terminated  or revoked sooner.       Influenza A by PCR NEGATIVE NEGATIVE Final   Influenza B by PCR NEGATIVE NEGATIVE  Final    Comment: (NOTE) The Xpert Xpress SARS-CoV-2/FLU/RSV plus assay is intended as an aid in the diagnosis of influenza from Nasopharyngeal swab specimens and should not be used as a sole basis for treatment. Nasal washings and aspirates are unacceptable for Xpert Xpress SARS-CoV-2/FLU/RSV testing.  Fact Sheet for Patients: EntrepreneurPulse.com.au  Fact Sheet for Healthcare Providers: IncredibleEmployment.be  This test is not yet approved or cleared by the [place] FDA and has been authorized for detection and/or diagnosis of SARS-CoV-2 by FDA under an Emergency Use Authorization (EUA). This EUA will remain in effect (meaning this test can be used) for the duration of the COVID-19 declaration under Section 564(b)(1) of the Act, 21 U.S.C. section 360bbb-3(b)(1), unless the authorization is terminated or revoked.     Resp Syncytial Virus by PCR POSITIVE (A) NEGATIVE Final    Comment: (NOTE) Fact Sheet for Patients: EntrepreneurPulse.com.au  Fact Sheet for Healthcare Providers: IncredibleEmployment.be  This test is not yet approved or cleared by the [place] FDA and has been authorized for detection and/or diagnosis of SARS-CoV-2 by FDA under an Emergency Use Authorization (EUA). This EUA will remain in effect (meaning this test can be used) for the duration of the COVID-19 declaration under Section 564(b)(1) of the Act, 21 U.S.C. section 360bbb-3(b)(1), unless the authorization is terminated or revoked.  Performed at [facility], [place] [address] [place], [place] [address]   Respiratory (~20 pathogens) panel by PCR     Status: Abnormal   Collection Time: 07/20/22  3:04 PM   Specimen: Nasopharyngeal Swab; Respiratory  Result Value Ref Range Status   Adenovirus NOT DETECTED NOT DETECTED Final   Coronavirus 229E NOT DETECTED NOT DETECTED Final    Comment: (NOTE) The Coronavirus on  the Respiratory Panel, DOES NOT test for the novel  Coronavirus (2019 nCoV)    Coronavirus HKU1 NOT DETECTED NOT DETECTED Final   Coronavirus NL63 NOT DETECTED NOT DETECTED Final   Coronavirus OC43 NOT DETECTED NOT DETECTED Final   Metapneumovirus NOT DETECTED NOT DETECTED Final   Rhinovirus / Enterovirus NOT DETECTED NOT DETECTED Final   Influenza A NOT DETECTED NOT DETECTED Final   Influenza B NOT DETECTED NOT DETECTED Final   Parainfluenza Virus 1 NOT DETECTED NOT DETECTED Final   Parainfluenza Virus 2 NOT DETECTED NOT DETECTED Final   Parainfluenza Virus 3 NOT DETECTED NOT DETECTED Final   Parainfluenza Virus 4 NOT DETECTED NOT DETECTED Final   Respiratory Syncytial Virus DETECTED (A) NOT DETECTED Final   Bordetella pertussis NOT DETECTED NOT DETECTED Final   Bordetella Parapertussis NOT DETECTED NOT DETECTED Final   Chlamydophila pneumoniae NOT DETECTED NOT DETECTED Final   Mycoplasma pneumoniae NOT DETECTED NOT DETECTED Final    Comment: Performed at [facility], [place]. [address], [place], [place] [address]  Expectorated Sputum Assessment w Gram Stain, Rflx to Resp Cult  Status: None   Collection Time: 07/20/22  4:50 PM   Specimen: Expectorated Sputum  Result Value Ref Range Status   Specimen Description EXPECTORATED SPUTUM  Final   Special Requests NONE  Final   Sputum evaluation   Final    Sputum specimen not acceptable for testing.  Please recollect.   NOTIFIED RN LINDSEY LEGO ON 07/20/22 @ 2239 BY DRT Performed at [facility], [place] [address] [place], [place] [address]    Report Status 07/20/2022 FINAL  Final  Acid Fast Smear (AFB)     Status: None   Collection Time: 07/20/22  4:50 PM   Specimen: Sputum  Result Value Ref Range Status   AFB Specimen Processing Concentration  Final   Acid Fast Smear Negative  Final    Comment: (NOTE) Performed At: [facility] [place], [place] [id number] Rush Farmer MD NO:[id number]    Source  (AFB) EXPECTORATED SPUTUM  Final    Comment: Performed at [facility], [place] [address] [place], [place] [address]  Culture, blood (Routine X 2) w Reflex to ID Panel     Status: None   Collection Time: 07/20/22  6:24 PM   Specimen: BLOOD  Result Value Ref Range Status   Specimen Description BLOOD BLOOD RIGHT FOREARM  Final   Special Requests   Final    BOTTLES DRAWN AEROBIC AND ANAEROBIC Blood Culture results may not be optimal due to an excessive volume of blood received in culture bottles   Culture   Final    NO GROWTH 5 DAYS Performed at [facility], [place] [address], [place], [place] [address]    Report Status 07/25/2022 FINAL  Final  Culture, blood (Routine X 2) w Reflex to ID Panel     Status: None   Collection Time: 07/20/22 10:58 PM   Specimen: BLOOD  Result Value Ref Range Status   Specimen Description BLOOD LEFT ANTECUBITAL  Final   Special Requests   Final    BOTTLES DRAWN AEROBIC AND ANAEROBIC Blood Culture adequate volume   Culture   Final    NO GROWTH 5 DAYS Performed at [facility], [place] [address], [place], [place] [address]    Report Status 07/25/2022 FINAL  Final         Radiology Studies: DG Swallowing Func-Speech Pathology  Result Date: 07/23/2022 Table formatting from the original result was not included. Objective Swallowing Evaluation: Type of Study: MBS-Modified Barium Swallow Study  Patient Details Name: Stephanie Cortez MRN: [id number] Date of Birth: [birth year] Today's Date: 07/23/2022 Time: SLP Start Time (ACUTE ONLY): 1310 -SLP Stop Time (ACUTE ONLY): 1340 SLP Time Calculation (min) (ACUTE ONLY): 30 min Past Medical History: Past Medical History: Diagnosis Date  Anemia   - Hgb 9.7gm% on 07/13/2008 in [place] -  Hgg 129gm% wiht normal irone levsl and ferritin 10/27/2008 in [place] Recurrent otitis/sinusitis  Anxiety   chronic BZ prn  Atrial fibrillation (HCC)   chronic anticoag  Bronchiectasis   >PFT 07/13/2008 in [place] 1.9L/76%, FVC  2.45L/74, Ratio 79, TLC 121%, DLCO 64%  AE BRonchiectasis - Dec 2010.New Rx:  outpatient - Feb 2011 - Rx outpatient  CHF (congestive heart failure) (HCC)   COPD (chronic obstructive pulmonary disease) (HCC)   bronchiectasis  Cricopharyngeal achalasia   Depression   Diverticulosis   Dyslipidemia   Eczema   Fatty liver   GERD with stricture   Glaucoma   H. pylori infection  Hypertension   Hyponatremia   chronic, s/p endo eval 06/2012  Hypothyroid   IBS (irritable bowel syndrome)   Lumbar disc disease   Neuropathy of both feet   Segmental colitis ([place])   Ventral hernia   Vertigo   Wears glasses  Past Surgical History: Past Surgical History: Procedure Laterality Date  BOWEL RESECTION N/A 02/08/2020  Procedure: SMALL BOWEL RESECTION;  Surgeon: Donnie Mesa, MD;  Location: [place];  Service: General;  Laterality: N/A;  BREAST SURGERY    br bx  CARDIAC CATHETERIZATION  07/01/2013  CATARACT EXTRACTION    both  COLONOSCOPY    ESOPHAGOSCOPY W/ BOTOX INJECTION  12/11/2011  Procedure: ESOPHAGOSCOPY WITH BOTOX INJECTION;  Surgeon: Rozetta Nunnery, MD;  Location: [place];  Service: ENT;  Laterality: N/A;  esophogoscopy with dilation, botox injection  FOOT SURGERY  03/14/2011  gastroc slide-rt  GASTROSTOMY N/A 02/08/2020  Procedure: INSERTION OF GASTROSTOMY TUBE;  Surgeon: Donnie Mesa, MD;  Location: [place];  Service: General;  Laterality: N/A;  HIP ARTHROPLASTY Right 10/28/2019  Procedure: ARTHROPLASTY BIPOLAR HIP (HEMIARTHROPLASTY);  Surgeon: Marybelle Killings, MD;  Location: [facility];  Service: Orthopedics;  Laterality: Right;  LAPAROTOMY N/A 02/08/2020  Procedure: EXPLORATORY LAPAROTOMY;  Surgeon: Donnie Mesa, MD;  Location: [place];  Service: General;  Laterality: N/A;  LYSIS OF ADHESION N/A 02/08/2020  Procedure: LYSIS OF ADHESIONS;  Surgeon: Donnie Mesa, MD;  Location: [facility];  Service: General;  Laterality: N/A;  TOTAL ABDOMINAL HYSTERECTOMY    TOTAL HIP REVISION Right 11/16/2019  Procedure: TOTAL HIP REVISION  BIPOLAR TO CEMENTED BIPOLAR;  Surgeon: Marybelle Killings, MD;  Location: [place];  Service: Orthopedics;  Laterality: Right;  WISDOM TOOTH EXTRACTION   HPI: 89 y.o. female  with past medical history of longstanding cricopharyngeal achalasia (successful treatment previously with Botulinin toxin), ILD, permanent A-fib (Eliquis), HFpEF, HTN, hypothyroidism admitted on 07/20/2022 with weakness.  Patient was scheduled for follow-up with up with Dr. Chase Caller on 1/19.  However, that morning patient was too weak for outpatient visit and was subsequently transferred to [facility] ED by EMS. On admission, patient tested positive for RSV infection.  She is being treated with a steroid taper, Brovana/Pulmicort, DuoNebs, guaifenesin, frequent flutter valve usage, and IV Lasix.  No data recorded  Recommendations for follow up therapy are one component of a multi-disciplinary discharge planning process, led by the attending physician.  Recommendations may be updated based on patient status, additional functional criteria and insurance authorization. Assessment / Plan / Recommendation   07/23/2022   1:00 PM Clinical Impressions Clinical Impression Patient presents with a normal oropharyngeal swallow. Timely and complete oral manipulate/mastication of all consistencies provided and swift swallow initiation noted. Patient does have what appear to be osteophytes located at C4-6 as well as a CP bar which protrude into esophageal space but did not impede clearance of bolus or result in any post swallow residue during todays study despite patient being challenged with multiple bites of regular texture solids and barium pill. Full airway protection noted. She also did not complain of globus during today's study and although cannot r/o presence of post swallow residue with dry or sticky solids, suspect that previous complaints of globus are related to delayed/decreased clearance of esophagus below this level. Given history and degree to which  patient complaints are prohibiting po intake, continue to recommend GI f/u, with possible barium swallow to assess esophageal clearance. Educated patient and daughter (via phone). No SLP f/u indicated at this time. SLP  Visit Diagnosis Dysphagia, unspecified (R13.10)     07/23/2022   1:00 PM Treatment Recommendations Treatment Recommendations No treatment recommended at this time     06/23/2021   2:29 PM Prognosis Prognosis for Safe Diet Advancement Good   07/23/2022   1:00 PM Diet Recommendations SLP Diet Recommendations Regular solids;Thin liquid Liquid Administration via Cup;Straw Medication Administration Whole meds with liquid Compensations Slow rate;Small sips/bites;Follow solids with liquid Postural Changes Remain semi-upright after after feeds/meals (Comment);Seated upright at 90 degrees     07/23/2022   1:00 PM Other Recommendations Recommended Consults Consider GI evaluation;Consider esophageal assessment Oral Care Recommendations Oral care BID Follow Up Recommendations No SLP follow up   06/23/2021   2:29 PM Frequency and Duration  Speech Therapy Frequency (ACUTE ONLY) min 1 x/week Treatment Duration 1 week     07/23/2022   1:00 PM Oral Phase Oral Phase [facility]    07/23/2022   1:00 PM Pharyngeal Phase Pharyngeal Phase [facility]    07/23/2022   1:00 PM Cervical Esophageal Phase  Cervical Esophageal Phase [facility] Leah McCoy MA, CCC-SLP McCoy Leah Meryl 07/23/2022, 2:09 PM                          Scheduled Meds:  apixaban  2.5 mg Oral BID   arformoterol  15 mcg Nebulization BID   budesonide (PULMICORT) nebulizer solution  0.5 mg Nebulization BID   feeding supplement  237 mL Oral BID BM   FLUoxetine  20 mg Oral Daily   guaiFENesin  600 mg Oral BID   levothyroxine  50 mcg Oral Daily   pravastatin  40 mg Oral QHS   predniSONE  50 mg Oral Q breakfast   sodium chloride flush  3 mL Intravenous Q12H   Continuous Infusions:     LOS: 5 days    Time spent: 35 minutes    Barb Merino, MD [facility] Pager [phone]

## 2022-07-25 NOTE — Progress Notes (Signed)
This chaplain is present for F/U spiritual care. The Pt. is awake and shares she is uncomfortable and experiencing pain identified as 7-8.  The Pt. RN Milta Deiters was updated and responded.   The chaplain also listened reflectively as the Pt. shared the difficulty in waiting for a bed for rehab. The chaplain understands from the Pt. she is accepting rehab comes before home. The Pt. continues the conversation by comparing herself to [place] birth narrative and no room in the inn. The chaplain offered the Pt. accepted the concept of time and the importance of putting together the pieces in both stories.   The visit ended with the Pt. teaching the chaplain pursed lip breathing and the Pt. looking for the opportunity to rest.  Chaplain Sallyanne Kuster [phone]

## 2022-07-25 NOTE — Plan of Care (Signed)

## 2022-07-25 NOTE — Progress Notes (Signed)
Mobility Specialist Progress Note    07/25/22 1422  Mobility  Activity Ambulated with assistance in hallway  Level of Assistance Minimal assist, patient does 75% or more  Assistive Device Front wheel walker  Distance Ambulated (ft) 60 ft  Activity Response Tolerated well  Mobility Referral Yes  $Mobility charge 1 Mobility   Pt received in bed and agreeable. No complaints. Encouraged pursed lip breathing. On 3LO2. Returned to bed with call bell in reach.   Hildred Alamin Mobility Specialist  Please Psychologist, sport and exercise or Rehab Office at [phone]

## 2022-07-25 NOTE — Progress Notes (Signed)
Patient is diagnosed with RSV . She does have underlying lung disease .   As per CDC guidelines  - outpatient settings , hand hygiene and mask for 5-8 days  - inpatient settings, hand hygiene, mask and contact isolation . Stephanie Cortez is symptomatic for about one week now, will recommend standard precautions along with universal mask wearing when discharged from the hospital.

## 2022-07-25 NOTE — Progress Notes (Signed)
Occupational Therapy Treatment Patient Details Name: Stephanie Cortez MRN: [id number] DOB: Jul 05, 1933 Today's Date: 07/25/2022   History of present illness Pt is an 89 y.o. female who presented 07/20/22 with dyspnea. Pt admitted with RSV infection, and acute exacerbation of chronic interstitial lung disease. PMH: anemia, afib, CHF, COPD, HTN, hypothyroid, bil feet neuropathy, vertigo   OT comments  Pt progressing towards goals, able to complete standing grooming task and ambulate in room with min guard A using RW. Pt SpO2 93% on RA, attempted to titrate O2 down, however unable to obtain SpO2 reading, left pt on 3L O2 throughout session. Pt presenting with impairments listed below, will follow acutely. Continue to recommend SNF at d/c.   Recommendations for follow up therapy are one component of a multi-disciplinary discharge planning process, led by the attending physician.  Recommendations may be updated based on patient status, additional functional criteria and insurance authorization.    Follow Up Recommendations  Skilled nursing-short term rehab (<3 hours/day)     Assistance Recommended at Discharge Frequent or constant Supervision/Assistance  Patient can return home with the following  Direct supervision/assist for medications management;Direct supervision/assist for financial management;Help with stairs or ramp for entrance;A little help with walking and/or transfers;A little help with bathing/dressing/bathroom;Assistance with cooking/housework   Equipment Recommendations  None recommended by OT (defer)    Recommendations for Other Services PT consult    Precautions / Restrictions Precautions Precautions: Fall;Other (comment) Precaution Comments: watch SpO2 Restrictions Weight Bearing Restrictions: No       Mobility Bed Mobility Overal bed mobility: Needs Assistance Bed Mobility: Supine to Sit     Supine to sit: Supervision, HOB elevated          Transfers Overall  transfer level: Needs assistance Equipment used: Rolling walker (2 wheels) Transfers: Sit to/from Stand Sit to Stand: From elevated surface, Min assist, Mod assist                 Balance Overall balance assessment: Needs assistance Sitting-balance support: Feet supported Sitting balance-Leahy Scale: Fair   Postural control: Posterior lean Standing balance support: Bilateral upper extremity supported, During functional activity, Reliant on assistive device for balance Standing balance-Leahy Scale: Poor Standing balance comment: Reliant on RW and up to minA                           ADL either performed or assessed with clinical judgement   ADL Overall ADL's : Needs assistance/impaired     Grooming: Min guard;Standing;Oral care;Wash/dry face Grooming Details (indicate cue type and reason): standing at sink                 Toilet Transfer: Min guard;Rolling walker (2 wheels);Ambulation;Regular Toilet;Minimal assistance Toilet Transfer Details (indicate cue type and reason): initial assist for steadying         Functional mobility during ADLs: Min guard;Rolling walker (2 wheels);Minimal assistance      Extremity/Trunk Assessment Upper Extremity Assessment Upper Extremity Assessment: Generalized weakness   Lower Extremity Assessment Lower Extremity Assessment: Defer to PT evaluation        Vision   Vision Assessment?: No apparent visual deficits   Perception Perception Perception: Not tested   Praxis Praxis Praxis: Not tested    Cognition Arousal/Alertness: Awake/alert Behavior During Therapy: WFL for tasks assessed/performed Overall Cognitive Status: Within Functional Limits for tasks assessed  Exercises      Shoulder Instructions       General Comments SpO2 93% on 3L O2, attempted to wean pt down, however unable to obtain O2 reading, left pt on 3L throughout session     Pertinent Vitals/ Pain       Pain Assessment Pain Assessment: Faces Pain Score: 3  Faces Pain Scale: Hurts little more Pain Location: R hip/chronic Pain Descriptors / Indicators: Discomfort Pain Intervention(s): Limited activity within patient's tolerance, Monitored during session, Repositioned  Home Living                                          Prior Functioning/Environment              Frequency  Min 2X/week        Progress Toward Goals  OT Goals(current goals can now be found in the care plan section)  Progress towards OT goals: Progressing toward goals  Acute Rehab OT Goals Patient Stated Goal: none stated OT Goal Formulation: With patient Time For Goal Achievement: 08/06/22 Potential to Achieve Goals: Good ADL Goals Pt Will Perform Upper Body Dressing: with supervision;sitting Pt Will Perform Lower Body Dressing: with supervision;sitting/lateral leans;sit to/from stand Pt Will Transfer to Toilet: with supervision;ambulating;regular height toilet Pt Will Perform Tub/Shower Transfer: with supervision;Tub transfer;Shower transfer;ambulating;shower seat;rolling walker  Plan Discharge plan remains appropriate;Frequency remains appropriate    Co-evaluation                 AM-PAC OT "6 Clicks" Daily Activity     Outcome Measure   Help from another person eating meals?: None Help from another person taking care of personal grooming?: A Little Help from another person toileting, which includes using toliet, bedpan, or urinal?: A Little Help from another person bathing (including washing, rinsing, drying)?: A Lot Help from another person to put on and taking off regular upper body clothing?: A Little Help from another person to put on and taking off regular lower body clothing?: A Lot 6 Click Score: 17    End of Session Equipment Utilized During Treatment: Gait belt;Rolling walker (2 wheels);Oxygen (3L)  OT Visit Diagnosis:  Unsteadiness on feet (R26.81);Other abnormalities of gait and mobility (R26.89);Muscle weakness (generalized) (M62.81)   Activity Tolerance Patient tolerated treatment well   Patient Left in chair;with call bell/phone within reach;with chair alarm set;with nursing/sitter in room   Nurse Communication Mobility status        Time: 3536-1443 OT Time Calculation (min): 31 min  Charges: OT General Charges $OT Visit: 1 Visit OT Treatments $Self Care/Home Management : 8-22 mins $Therapeutic Activity: 8-22 mins  Renaye Rakers, OTD, OTR/L SecureChat Preferred Acute Rehab [phone]   Renaye Rakers [place] 07/25/2022, 9:19 AM

## 2022-07-26 DIAGNOSIS — J9600 Acute respiratory failure, unspecified whether with hypoxia or hypercapnia: Secondary | ICD-10-CM | POA: Diagnosis not present

## 2022-07-26 DIAGNOSIS — R531 Weakness: Secondary | ICD-10-CM | POA: Diagnosis not present

## 2022-07-26 DIAGNOSIS — F339 Major depressive disorder, recurrent, unspecified: Secondary | ICD-10-CM | POA: Diagnosis not present

## 2022-07-26 DIAGNOSIS — Z9981 Dependence on supplemental oxygen: Secondary | ICD-10-CM | POA: Diagnosis not present

## 2022-07-26 DIAGNOSIS — E785 Hyperlipidemia, unspecified: Secondary | ICD-10-CM | POA: Diagnosis not present

## 2022-07-26 DIAGNOSIS — K219 Gastro-esophageal reflux disease without esophagitis: Secondary | ICD-10-CM | POA: Diagnosis not present

## 2022-07-26 DIAGNOSIS — K224 Dyskinesia of esophagus: Secondary | ICD-10-CM | POA: Diagnosis not present

## 2022-07-26 DIAGNOSIS — E440 Moderate protein-calorie malnutrition: Secondary | ICD-10-CM | POA: Diagnosis not present

## 2022-07-26 DIAGNOSIS — J9611 Chronic respiratory failure with hypoxia: Secondary | ICD-10-CM | POA: Diagnosis not present

## 2022-07-26 DIAGNOSIS — I5030 Unspecified diastolic (congestive) heart failure: Secondary | ICD-10-CM | POA: Diagnosis not present

## 2022-07-26 DIAGNOSIS — H04123 Dry eye syndrome of bilateral lacrimal glands: Secondary | ICD-10-CM | POA: Diagnosis not present

## 2022-07-26 DIAGNOSIS — K921 Melena: Secondary | ICD-10-CM | POA: Diagnosis not present

## 2022-07-26 DIAGNOSIS — R1319 Other dysphagia: Secondary | ICD-10-CM | POA: Diagnosis not present

## 2022-07-26 DIAGNOSIS — I2510 Atherosclerotic heart disease of native coronary artery without angina pectoris: Secondary | ICD-10-CM | POA: Diagnosis not present

## 2022-07-26 DIAGNOSIS — G8929 Other chronic pain: Secondary | ICD-10-CM | POA: Diagnosis not present

## 2022-07-26 DIAGNOSIS — B338 Other specified viral diseases: Secondary | ICD-10-CM | POA: Diagnosis not present

## 2022-07-26 DIAGNOSIS — F411 Generalized anxiety disorder: Secondary | ICD-10-CM | POA: Diagnosis not present

## 2022-07-26 DIAGNOSIS — K220 Achalasia of cardia: Secondary | ICD-10-CM | POA: Diagnosis not present

## 2022-07-26 DIAGNOSIS — E039 Hypothyroidism, unspecified: Secondary | ICD-10-CM | POA: Diagnosis not present

## 2022-07-26 DIAGNOSIS — R09A2 Foreign body sensation, throat: Secondary | ICD-10-CM | POA: Diagnosis not present

## 2022-07-26 DIAGNOSIS — Z7189 Other specified counseling: Secondary | ICD-10-CM | POA: Diagnosis not present

## 2022-07-26 DIAGNOSIS — D649 Anemia, unspecified: Secondary | ICD-10-CM | POA: Diagnosis not present

## 2022-07-26 DIAGNOSIS — Z515 Encounter for palliative care: Secondary | ICD-10-CM | POA: Diagnosis not present

## 2022-07-26 DIAGNOSIS — R1013 Epigastric pain: Secondary | ICD-10-CM | POA: Diagnosis not present

## 2022-07-26 DIAGNOSIS — R911 Solitary pulmonary nodule: Secondary | ICD-10-CM | POA: Diagnosis not present

## 2022-07-26 DIAGNOSIS — Z7401 Bed confinement status: Secondary | ICD-10-CM | POA: Diagnosis not present

## 2022-07-26 DIAGNOSIS — L299 Pruritus, unspecified: Secondary | ICD-10-CM | POA: Diagnosis not present

## 2022-07-26 DIAGNOSIS — R5383 Other fatigue: Secondary | ICD-10-CM | POA: Diagnosis not present

## 2022-07-26 DIAGNOSIS — I4821 Permanent atrial fibrillation: Secondary | ICD-10-CM | POA: Diagnosis not present

## 2022-07-26 DIAGNOSIS — F5101 Primary insomnia: Secondary | ICD-10-CM | POA: Diagnosis not present

## 2022-07-26 DIAGNOSIS — R5381 Other malaise: Secondary | ICD-10-CM | POA: Diagnosis not present

## 2022-07-26 DIAGNOSIS — R6881 Early satiety: Secondary | ICD-10-CM | POA: Diagnosis not present

## 2022-07-26 DIAGNOSIS — Z7901 Long term (current) use of anticoagulants: Secondary | ICD-10-CM | POA: Diagnosis not present

## 2022-07-26 DIAGNOSIS — J849 Interstitial pulmonary disease, unspecified: Secondary | ICD-10-CM | POA: Diagnosis not present

## 2022-07-26 DIAGNOSIS — F32A Depression, unspecified: Secondary | ICD-10-CM | POA: Diagnosis not present

## 2022-07-26 DIAGNOSIS — H903 Sensorineural hearing loss, bilateral: Secondary | ICD-10-CM | POA: Diagnosis not present

## 2022-07-26 DIAGNOSIS — J479 Bronchiectasis, uncomplicated: Secondary | ICD-10-CM | POA: Diagnosis not present

## 2022-07-26 DIAGNOSIS — Z599 Problem related to housing and economic circumstances, unspecified: Secondary | ICD-10-CM | POA: Diagnosis not present

## 2022-07-26 DIAGNOSIS — Z9181 History of falling: Secondary | ICD-10-CM | POA: Diagnosis not present

## 2022-07-26 DIAGNOSIS — R0902 Hypoxemia: Secondary | ICD-10-CM | POA: Diagnosis not present

## 2022-07-26 DIAGNOSIS — J9601 Acute respiratory failure with hypoxia: Secondary | ICD-10-CM | POA: Diagnosis not present

## 2022-07-26 DIAGNOSIS — I482 Chronic atrial fibrillation, unspecified: Secondary | ICD-10-CM | POA: Diagnosis not present

## 2022-07-26 DIAGNOSIS — I110 Hypertensive heart disease with heart failure: Secondary | ICD-10-CM | POA: Diagnosis not present

## 2022-07-26 DIAGNOSIS — K5900 Constipation, unspecified: Secondary | ICD-10-CM | POA: Diagnosis not present

## 2022-07-26 DIAGNOSIS — F40240 Claustrophobia: Secondary | ICD-10-CM | POA: Diagnosis not present

## 2022-07-26 DIAGNOSIS — R1111 Vomiting without nausea: Secondary | ICD-10-CM | POA: Diagnosis not present

## 2022-07-26 MED ORDER — GUAIFENESIN-DM 100-10 MG/5ML PO SYRP: 5.0000 mL | ORAL_SOLUTION | ORAL | 0 refills | Status: DC | PRN

## 2022-07-26 MED ORDER — ENSURE ENLIVE PO LIQD: 237.0000 mL | Freq: Two times a day (BID) | ORAL | 12 refills | Status: DC

## 2022-07-26 MED ORDER — PREDNISONE 10 MG PO TABS: ORAL_TABLET | ORAL | 0 refills | Status: DC

## 2022-07-26 MED ORDER — GUAIFENESIN ER 600 MG PO TB12: 600.0000 mg | ORAL_TABLET | Freq: Two times a day (BID) | ORAL | Status: DC

## 2022-07-26 MED ORDER — IPRATROPIUM-ALBUTEROL 0.5-2.5 (3) MG/3ML IN SOLN: 3.0000 mL | RESPIRATORY_TRACT | Status: DC | PRN

## 2022-07-26 NOTE — Plan of Care (Signed)

## 2022-07-26 NOTE — TOC Transition Note (Signed)
Transition of Care [facility]) - CM/SW Discharge Note   Patient Details  Name: Stephanie Cortez MRN: [id number] Date of Birth: 02-Apr-1934  Transition of [facility]) CM/SW Contact:  Bjorn Pippin, LCSW Phone Number: 07/26/2022, 2:01 PM   Clinical Narrative:    Patient will DC to: [name]  Anticipated DC date: 07/26/2022 Family notified: Daughter, [name] Transport by: Corey Harold   Per MD patient ready for DC to [place]. RN, patient, patient's family, and facility notified of DC. Discharge Summary and FL2 sent to facility. RN to call report prior to discharge [phone] . DC packet on chart. Ambulance transport requested for patient.   CSW will sign off for now as social work intervention is no longer needed. Please consult [place] again if new needs arise.    Final next level of care: Skilled Nursing Facility Barriers to Discharge: No Barriers Identified   Patient Goals and CMS Choice CMS Medicare.gov Compare Post Acute Care list provided to:: Patient Represenative (must comment) Choice offered to / list presented to : Adult Children  Discharge Placement                Patient chooses bed at: [name] Patient to be transferred to facility by: [place] Name of family member notified: [name] (daughter) Patient and family notified of of transfer: 07/26/22  Discharge Plan and Services Additional resources added to the After Visit Summary for   In-house Referral: Hospice / Palliative Care Discharge Planning Services: CM Consult Post Acute Care Choice: Hospice          DME Arranged:  (Hospice will supply DME)         HH Arranged: RN [facility] Agency: [place] Date [place]: 07/23/22 Time [place]: 1211 Representative spoke with at [place]: [place] Determinants of Health ([place]) Interventions SDOH Screenings   Food Insecurity: No Food Insecurity (07/20/2022)  Housing: Low Risk  (07/20/2022)  Transportation Needs: No Transportation Needs  (07/20/2022)  Utilities: Not At Risk (07/20/2022)  Depression (PHQ2-9): Low Risk  (07/06/2022)  Tobacco Use: Low Risk  (07/20/2022)     Readmission Risk Interventions    02/10/2020    4:21 PM  Readmission Risk Prevention Plan  Transportation Screening Complete  HRI or [place] Complete  Social Work Consult for [place] Planning/Counseling Complete  Palliative Care Screening Not Applicable  Medication Review Press [employer]) Complete      Beckey Rutter, MSW, LCSWA, LCASA Transitions of Care  Clinical Social Worker I

## 2022-07-26 NOTE — Progress Notes (Signed)
   07/26/22 1100  Mobility  Activity Ambulated with assistance in room;Transferred from bed to chair  Level of Assistance Minimal assist, patient does 75% or more  Assistive Device Front wheel walker  Distance Ambulated (ft) 5 ft  Activity Response Tolerated well  Mobility Referral Yes  $Mobility charge 1 Mobility   Mobility Specialist Progress Note  Pt was in bed and agreeable. Had c/o leg pain throughout transfer. Left in chair w/ all needs met and call bell in reach.   Lucious Groves Mobility Specialist  Please contact via SecureChat or Rehab office at [phone]

## 2022-07-26 NOTE — Consult Note (Signed)
   [facility] CM Inpatient Consult   07/26/2022  Stephanie Cortez 06-Sep-1933 [id number]  [place] [facility] [ACO] Patient: Medicare ACO REACH  Primary Care Provider:  Marin Olp, MD, with [place] [facility]  Patient screened for post hospitalization disposition with noted to assess for potential [place] Management service needs for post hospital transition for care coordination.  Review of patient's electronic medical record reveals patient is currently for SNF   Plan: Will notify [facility] PAC RN to make aware of patient to [name] which is a [facility] an affiliate.   Of note, [facility] Care Management/Population Health does not replace or interfere with any arrangements made by the Inpatient Transition of Care team.  For questions contact:   Natividad Brood, RN BSN [place]  [phone] business mobile phone Toll free office [phone]  *[place]  [phone] Fax number: [phone] [email or web address]

## 2022-07-26 NOTE — Discharge Summary (Signed)
Physician Discharge Summary  RILEI KRAVITZ Cortez:[id number] DOB: 29-Jul-1933 DOA: 07/20/2022  PCP: Marin Olp, MD  Admit date: 07/20/2022 Discharge date: 07/26/2022  Admitted From: Home Disposition: Skilled nursing facility  Recommendations for Outpatient Follow-up:  Follow up with PCP in 1-2 weeks Pulmonary to schedule follow-up  Home Health: N/A Equipment/Devices: Oxygen 2 L nasal cannula  Discharge Condition: Fair CODE STATUS: DNR Diet recommendation: Low-salt diet, nutritional supplements  Discharge summary: 89 y.o. female with medical history significant for ILD, permanent A-fib on Eliquis, HFpEF, HTN, hypothyroidism who presented to the ED for evaluation of dyspnea. Patient does have ongoing symptoms for about more than 3 months now with progressive lethargy and exertional dyspnea.  Symptoms worse.  2 weeks.  Used different cough medications as outpatient without much improvement.  She was supposed to be seen at pulmonary office but at home she was found almost incoherent so EMS was called.  She was found 77% on room air by EMS.  Put on nonrebreather and brought to the ER. In the emergency room, able to wean off on 2 to 3 L of oxygen.  Electrolytes are stable. Chest x-ray showed severe chronic interstitial lung disease with upper lobe predominance slightly increased from previous exam.  Also seen by critical care.  Admitted.  Respiratory virus panel was positive for RSV.     Assessment & Plan:   Acute exacerbation of chronic interstitial lung disease, acute hypoxemic respiratory failure. Acute RSV infection   Treated with aggressive bronchodilator therapy.  Was on IV steroids and currently remains on oral steroids.  Recommended gradual taper for total 2 weeks. inhalational steroids, scheduled and as needed bronchodilators, deep breathing exercises, incentive spirometry, chest physiotherapy. Mucolytic's. All cultures were negative.  Antibiotics were discontinued. Supplemental  oxygen to keep saturations more than 90%. PT/OT/speech follow-up. Some clinical improvement but she still struggles with mobility. Continue to work to improve mobility.  Refer to SNF.   Chronic medical issues including Chronic A-fib, stable.  Therapeutic on Eliquis.  Not on any rate control medications. Heart failure with preserved ejection fraction, euvolemic.  No more on diuretics. Hypokalemia, replaced and adequate. Hyperlipidemia, on statin. Depression, on Prozac. Hypothyroidism, on Synthroid.   Hospital-acquired delirium: Improved.   Goal of care: Severe and chronic interstitial lung disease with exacerbation in a patient with severe frailty and debility carries poor prognosis. Seen by palliative.  DNR. Currently desires to regain some strength at a skilled nursing facility and ultimately go home with caretaker support.  She is open to home hospice support system in the future. Please consult palliative care team at the skilled nursing facility.  Frail and debilitated, chronically sick but today medically stable to transfer to a SNF level of care.  Discharge Diagnoses:  Principal Problem:   Acute respiratory failure with hypoxia (HCC) Active Problems:   Permanent atrial fibrillation (HCC)   Hypothyroid   Depression   Heart failure with preserved ejection fraction (HCC)   ILD (interstitial lung disease) ([place])   Palliative care by specialist   Hyperlipidemia, unspecified   RSV (respiratory syncytial virus infection)   DNR (do not resuscitate)    Discharge Instructions  Discharge Instructions     Call MD for:  difficulty breathing, headache or visual disturbances   Complete by: As directed    Diet - low sodium heart healthy   Complete by: As directed    Discharge instructions   Complete by: As directed    Keep on oxygen to keep sats >90% Consult palliative  care team at SNF   Increase activity slowly   Complete by: As directed       Allergies as of 07/26/2022        Reactions   Doxycycline Other (See Comments)   Rash on face and neck   Tramadol    Constipation, abdominal obstruction leading to weakness after using this   Vicodin [hydrocodone-acetaminophen] Other (See Comments)   Hallucinations    Flagyl [metronidazole] Nausea And Vomiting   Moxifloxacin Other (See Comments)    Weakness/fatigue   Pantoprazole Sodium Rash   Penicillins Rash   Has patient had a PCN reaction causing immediate rash, facial/tongue/throat swelling, SOB or lightheadedness with hypotension:unsure Has patient had a PCN reaction causing severe rash involving mucus membranes or skin necrosis:unsure Has patient had a PCN reaction that required hospitalization:No Has patient had a PCN reaction occurring within the last 10 years:Yes Cannot recall exact reaction due to time lapse If all of the above answers are "NO", then may proceed with Cephalosporin use. Other reaction(s): Unknown        Medication List     STOP taking these medications    benzonatate 100 MG capsule Commonly known as: TESSALON   Fluocinolone Acetonide 0.01 % Oil   furosemide 20 MG tablet Commonly known as: LASIX   potassium chloride 8 MEQ tablet Commonly known as: KLOR-CON       TAKE these medications    acetaminophen 325 MG tablet Commonly known as: TYLENOL Take 2 tablets (650 mg total) by mouth every 6 (six) hours as needed for mild pain (or Fever >/= 101).   Eliquis 2.5 MG Tabs tablet Generic drug: apixaban Take 1 tablet (2.5 mg total) by mouth 2 (two) times daily. Needs cardiology appt for refills, call office   feeding supplement Liqd Take 237 mLs by mouth 2 (two) times daily between meals.   FLUoxetine 20 MG capsule Commonly known as: PROZAC Take 1 capsule (20 mg total) by mouth daily.   guaiFENesin 600 MG 12 hr tablet Commonly known as: MUCINEX Take 1 tablet (600 mg total) by mouth 2 (two) times daily.   guaiFENesin-dextromethorphan 100-10 MG/5ML syrup Commonly  known as: ROBITUSSIN DM Take 5 mLs by mouth every 4 (four) hours as needed for cough.   ipratropium-albuterol 0.5-2.5 (3) MG/3ML Soln Commonly known as: DUONEB Take 3 mLs by nebulization every 4 (four) hours as needed.   levothyroxine 50 MCG tablet Commonly known as: SYNTHROID Take 1 tablet (50 mcg total) by mouth daily.   MIRALAX PO Take by mouth.   omeprazole 40 MG capsule Commonly known as: PRILOSEC Take 1 capsule (40 mg total) by mouth 2 (two) times daily before a meal.   pravastatin 40 MG tablet Commonly known as: PRAVACHOL Take 1 tablet (40 mg total) by mouth at bedtime.   predniSONE 10 MG tablet Commonly known as: DELTASONE 4 tablet daily for 2 days 3 tablet daily for 2 days 2 tablets daily for 2 days 1 tablet daily for 2 days Start taking on: July 27, 2022   psyllium 58.6 % packet Commonly known as: METAMUCIL Take 1 packet by mouth daily as needed (constipation).   Symbicort 80-4.5 MCG/ACT inhaler Generic drug: budesonide-formoterol INHALE 2 PUFFS INTO THE LUNGS TWICE DAILY   traZODone 50 MG tablet Commonly known as: DESYREL Take 1 tablet (50 mg total) by mouth at bedtime as needed.        Follow-up Information     Brand Males, MD. Call in 1 week(s).  Specialty: Pulmonary Disease Why: For hospital follow-up Contact information: [address] [place] [address] [phone]                Allergies  Allergen Reactions   Doxycycline Other (See Comments)    Rash on face and neck   Tramadol     Constipation, abdominal obstruction leading to weakness after using this   Vicodin [Hydrocodone-Acetaminophen] Other (See Comments)    Hallucinations    Flagyl [Metronidazole] Nausea And Vomiting   Moxifloxacin Other (See Comments)     Weakness/fatigue   Pantoprazole Sodium Rash   Penicillins Rash    Has patient had a PCN reaction causing immediate rash, facial/tongue/throat swelling, SOB or lightheadedness with  hypotension:unsure Has patient had a PCN reaction causing severe rash involving mucus membranes or skin necrosis:unsure Has patient had a PCN reaction that required hospitalization:No Has patient had a PCN reaction occurring within the last 10 years:Yes Cannot recall exact reaction due to time lapse If all of the above answers are "NO", then may proceed with Cephalosporin use.  Other reaction(s): Unknown    Consultations: Pulmonary Palliative   Procedures/Studies: DG Swallowing Func-Speech Pathology  Result Date: 07/23/2022 Table formatting from the original result was not included. Objective Swallowing Evaluation: Type of Study: MBS-Modified Barium Swallow Study  Patient Details Name: SHAUNICE LEVITAN MRN: [id number] Date of Birth: 13-Aug-1933 Today's Date: 07/23/2022 Time: SLP Start Time (ACUTE ONLY): 1310 -SLP Stop Time (ACUTE ONLY): 1340 SLP Time Calculation (min) (ACUTE ONLY): 30 min Past Medical History: Past Medical History: Diagnosis Date  Anemia   - Hgb 9.7gm% on 07/13/2008 in [place] -  Hgg 129gm% wiht normal irone levsl and ferritin 10/27/2008 in [place] Recurrent otitis/sinusitis  Anxiety   chronic BZ prn  Atrial fibrillation (HCC)   chronic anticoag  Bronchiectasis   >PFT 07/13/2008 in [place] 1.9L/76%, FVC 2.45L/74, Ratio 79, TLC 121%, DLCO 64%  AE BRonchiectasis - Dec 2010.New Rx:  outpatient - Feb 2011 - Rx outpatient  CHF (congestive heart failure) ([place])   COPD (chronic obstructive pulmonary disease) (HCC)   bronchiectasis  Cricopharyngeal achalasia   Depression   Diverticulosis   Dyslipidemia   Eczema   Fatty liver   GERD with stricture   Glaucoma   H. pylori infection   Hypertension   Hyponatremia   chronic, s/p endo eval 06/2012  Hypothyroid   IBS (irritable bowel syndrome)   Lumbar disc disease   Neuropathy of both feet   Segmental colitis ([place])   Ventral hernia   Vertigo   Wears glasses  Past Surgical History: Past Surgical History: Procedure Laterality Date  BOWEL RESECTION N/A  02/08/2020  Procedure: SMALL BOWEL RESECTION;  Surgeon: Donnie Mesa, MD;  Location: [facility];  Service: General;  Laterality: N/A;  BREAST SURGERY    br bx  CARDIAC CATHETERIZATION  07/01/2013  CATARACT EXTRACTION    both  COLONOSCOPY    ESOPHAGOSCOPY W/ BOTOX INJECTION  12/11/2011  Procedure: ESOPHAGOSCOPY WITH BOTOX INJECTION;  Surgeon: Rozetta Nunnery, MD;  Location: [facility];  Service: ENT;  Laterality: N/A;  esophogoscopy with dilation, botox injection  FOOT SURGERY  03/14/2011  gastroc slide-rt  GASTROSTOMY N/A 02/08/2020  Procedure: INSERTION OF GASTROSTOMY TUBE;  Surgeon: Donnie Mesa, MD;  Location: [facility];  Service: General;  Laterality: N/A;  HIP ARTHROPLASTY Right 10/28/2019  Procedure: ARTHROPLASTY BIPOLAR HIP (HEMIARTHROPLASTY);  Surgeon: Marybelle Killings, MD;  Location: [facility];  Service: Orthopedics;  Laterality: Right;  LAPAROTOMY N/A 02/08/2020  Procedure: EXPLORATORY LAPAROTOMY;  Surgeon: Donnie Mesa, MD;  Location: [facility];  Service: General;  Laterality: N/A;  LYSIS OF ADHESION N/A 02/08/2020  Procedure: LYSIS OF ADHESIONS;  Surgeon: Donnie Mesa, MD;  Location: [facility];  Service: General;  Laterality: N/A;  TOTAL ABDOMINAL HYSTERECTOMY    TOTAL HIP REVISION Right 11/16/2019  Procedure: TOTAL HIP REVISION BIPOLAR TO CEMENTED BIPOLAR;  Surgeon: Marybelle Killings, MD;  Location: [facility];  Service: Orthopedics;  Laterality: Right;  WISDOM TOOTH EXTRACTION   HPI: 89 y.o. female  with past medical history of longstanding cricopharyngeal achalasia (successful treatment previously with Botulinin toxin), ILD, permanent A-fib (Eliquis), HFpEF, HTN, hypothyroidism admitted on 07/20/2022 with weakness.  Patient was scheduled for follow-up with up with Dr. Chase Caller on 1/19.  However, that morning patient was too weak for outpatient visit and was subsequently transferred to [facility] ED by EMS. On admission, patient tested positive for RSV infection.  She is being treated with a steroid taper,  Brovana/Pulmicort, DuoNebs, guaifenesin, frequent flutter valve usage, and IV Lasix.  No data recorded  Recommendations for follow up therapy are one component of a multi-disciplinary discharge planning process, led by the attending physician.  Recommendations may be updated based on patient status, additional functional criteria and insurance authorization. Assessment / Plan / Recommendation   07/23/2022   1:00 PM Clinical Impressions Clinical Impression Patient presents with a normal oropharyngeal swallow. Timely and complete oral manipulate/mastication of all consistencies provided and swift swallow initiation noted. Patient does have what appear to be osteophytes located at C4-6 as well as a CP bar which protrude into esophageal space but did not impede clearance of bolus or result in any post swallow residue during todays study despite patient being challenged with multiple bites of regular texture solids and barium pill. Full airway protection noted. She also did not complain of globus during today's study and although cannot r/o presence of post swallow residue with dry or sticky solids, suspect that previous complaints of globus are related to delayed/decreased clearance of esophagus below this level. Given history and degree to which patient complaints are prohibiting po intake, continue to recommend GI f/u, with possible barium swallow to assess esophageal clearance. Educated patient and daughter (via phone). No SLP f/u indicated at this time. SLP Visit Diagnosis Dysphagia, unspecified (R13.10)     07/23/2022   1:00 PM Treatment Recommendations Treatment Recommendations No treatment recommended at this time     06/23/2021   2:29 PM Prognosis Prognosis for Safe Diet Advancement Good   07/23/2022   1:00 PM Diet Recommendations SLP Diet Recommendations Regular solids;Thin liquid Liquid Administration via Cup;Straw Medication Administration Whole meds with liquid Compensations Slow rate;Small sips/bites;Follow  solids with liquid Postural Changes Remain semi-upright after after feeds/meals (Comment);Seated upright at 90 degrees     07/23/2022   1:00 PM Other Recommendations Recommended Consults Consider GI evaluation;Consider esophageal assessment Oral Care Recommendations Oral care BID Follow Up Recommendations No SLP follow up   06/23/2021   2:29 PM Frequency and Duration  Speech Therapy Frequency (ACUTE ONLY) min 1 x/week Treatment Duration 1 week     07/23/2022   1:00 PM Oral Phase Oral Phase [facility]    07/23/2022   1:00 PM Pharyngeal Phase Pharyngeal Phase [facility]    07/23/2022   1:00 PM Cervical Esophageal Phase  Cervical Esophageal Phase [facility] Leah McCoy MA, CCC-SLP McCoy Leah Meryl 07/23/2022, 2:09 PM  DG Chest Portable 1 View  Result Date: 07/20/2022 CLINICAL DATA:  Shortness of breath for a few weeks getting worse EXAM: PORTABLE CHEST 1 VIEW COMPARISON:  Portable exam 1535 hours compared to CT chest 12/12/2021 and chest radiograph 06/17/2021 FINDINGS: Enlargement of cardiac silhouette. Atherosclerotic calcification aorta. Severe diffuse interstitial infiltrates with upper lobe predominance, slightly increased from prior CT. No definite pleural effusion or pneumothorax. Bones demineralized with old posttraumatic deformity proximal LEFT humerus. IMPRESSION: Severe chronic interstitial lung disease with upper lobe predominance, greater on LEFT, slightly increased from previous exam. Aortic Atherosclerosis (ICD10-I70.0). Electronically Signed   By: Lavonia Dana M.D.   On: 07/20/2022 15:52   (Echo, Carotid, EGD, Colonoscopy, ERCP)    Subjective: Patient seen in the morning rounds.  At rest she denies any complaints.  No overnight events.  Patient does recognize that she has severe lung condition and may get exacerbations and more trouble with breathing.  Feels comfortable transferring out of the hospital.   Discharge Exam: Vitals:   07/26/22 0805 07/26/22 0811  BP:    Pulse:    Resp:    Temp:     SpO2: 98% 98%   Vitals:   07/26/22 0436 07/26/22 0755 07/26/22 0805 07/26/22 0811  BP: 128/76 128/77    Pulse: 83 78    Resp: 18 20    Temp: 98.1 F (36.7 C) 97.7 F (36.5 C)    TempSrc: Oral Oral    SpO2: 98% 99% 98% 98%  Weight:      Height:        General: Pt is alert, awake, frail and debilitated.  Able to talk in complete sentences.  Short of breath and pursed lip breathing on mobility.  Extremely debilitated. Cardiovascular: RRR, S1/S2 +, no rubs, no gallops Respiratory: Bilateral expiratory wheezes, inspiratory and expiratory crackles present. Abdominal: Soft, NT, ND, bowel sounds + Extremities: no edema, no cyanosis    The results of significant diagnostics from this hospitalization (including imaging, microbiology, ancillary and laboratory) are listed below for reference.     Microbiology: Recent Results (from the past 240 hour(s))  Resp panel by RT-PCR (RSV, Flu A&B, Covid) Anterior Nasal Swab     Status: Abnormal   Collection Time: 07/20/22  3:04 PM   Specimen: Anterior Nasal Swab  Result Value Ref Range Status   SARS Coronavirus 2 by RT PCR NEGATIVE NEGATIVE Final    Comment: (NOTE) SARS-CoV-2 target nucleic acids are NOT DETECTED.  The SARS-CoV-2 RNA is generally detectable in upper respiratory specimens during the acute phase of infection. The lowest concentration of SARS-CoV-2 viral copies this assay can detect is 138 copies/mL. A negative result does not preclude SARS-Cov-2 infection and should not be used as the sole basis for treatment or other patient management decisions. A negative result may occur with  improper specimen collection/handling, submission of specimen other than nasopharyngeal swab, presence of viral mutation(s) within the areas targeted by this assay, and inadequate number of viral copies(<138 copies/mL). A negative result must be combined with clinical observations, patient history, and epidemiological information. The expected  result is Negative.  Fact Sheet for Patients:  EntrepreneurPulse.com.au  Fact Sheet for Healthcare Providers:  IncredibleEmployment.be  This test is no t yet approved or cleared by the [place] FDA and  has been authorized for detection and/or diagnosis of SARS-CoV-2 by FDA under an Emergency Use Authorization (EUA). This EUA will remain  in effect (meaning this test can be used) for the duration of the COVID-19 declaration  under Section 564(b)(1) of the Act, 21 U.S.C.section 360bbb-3(b)(1), unless the authorization is terminated  or revoked sooner.       Influenza A by PCR NEGATIVE NEGATIVE Final   Influenza B by PCR NEGATIVE NEGATIVE Final    Comment: (NOTE) The Xpert Xpress SARS-CoV-2/FLU/RSV plus assay is intended as an aid in the diagnosis of influenza from Nasopharyngeal swab specimens and should not be used as a sole basis for treatment. Nasal washings and aspirates are unacceptable for Xpert Xpress SARS-CoV-2/FLU/RSV testing.  Fact Sheet for Patients: EntrepreneurPulse.com.au  Fact Sheet for Healthcare Providers: IncredibleEmployment.be  This test is not yet approved or cleared by the [place] FDA and has been authorized for detection and/or diagnosis of SARS-CoV-2 by FDA under an Emergency Use Authorization (EUA). This EUA will remain in effect (meaning this test can be used) for the duration of the COVID-19 declaration under Section 564(b)(1) of the Act, 21 U.S.C. section 360bbb-3(b)(1), unless the authorization is terminated or revoked.     Resp Syncytial Virus by PCR POSITIVE (A) NEGATIVE Final    Comment: (NOTE) Fact Sheet for Patients: EntrepreneurPulse.com.au  Fact Sheet for Healthcare Providers: IncredibleEmployment.be  This test is not yet approved or cleared by the [place] FDA and has been authorized for detection and/or  diagnosis of SARS-CoV-2 by FDA under an Emergency Use Authorization (EUA). This EUA will remain in effect (meaning this test can be used) for the duration of the COVID-19 declaration under Section 564(b)(1) of the Act, 21 U.S.C. section 360bbb-3(b)(1), unless the authorization is terminated or revoked.  Performed at [facility], [place] [address], [place], [place] [address]   Respiratory (~20 pathogens) panel by PCR     Status: Abnormal   Collection Time: 07/20/22  3:04 PM   Specimen: Nasopharyngeal Swab; Respiratory  Result Value Ref Range Status   Adenovirus NOT DETECTED NOT DETECTED Final   Coronavirus 229E NOT DETECTED NOT DETECTED Final    Comment: (NOTE) The Coronavirus on the Respiratory Panel, DOES NOT test for the novel  Coronavirus (2019 nCoV)    Coronavirus HKU1 NOT DETECTED NOT DETECTED Final   Coronavirus NL63 NOT DETECTED NOT DETECTED Final   Coronavirus OC43 NOT DETECTED NOT DETECTED Final   Metapneumovirus NOT DETECTED NOT DETECTED Final   Rhinovirus / Enterovirus NOT DETECTED NOT DETECTED Final   Influenza A NOT DETECTED NOT DETECTED Final   Influenza B NOT DETECTED NOT DETECTED Final   Parainfluenza Virus 1 NOT DETECTED NOT DETECTED Final   Parainfluenza Virus 2 NOT DETECTED NOT DETECTED Final   Parainfluenza Virus 3 NOT DETECTED NOT DETECTED Final   Parainfluenza Virus 4 NOT DETECTED NOT DETECTED Final   Respiratory Syncytial Virus DETECTED (A) NOT DETECTED Final   Bordetella pertussis NOT DETECTED NOT DETECTED Final   Bordetella Parapertussis NOT DETECTED NOT DETECTED Final   Chlamydophila pneumoniae NOT DETECTED NOT DETECTED Final   Mycoplasma pneumoniae NOT DETECTED NOT DETECTED Final    Comment: Performed at [facility], [place]. [address] [place], [place] [address]  Expectorated Sputum Assessment w Gram Stain, Rflx to Resp Cult     Status: None   Collection Time: 07/20/22  4:50 PM   Specimen: Expectorated Sputum  Result Value Ref Range Status    Specimen Description EXPECTORATED SPUTUM  Final   Special Requests NONE  Final   Sputum evaluation   Final    Sputum specimen not acceptable for testing.  Please recollect.   NOTIFIED RN LINDSEY LEGO ON 07/20/22 @  2239 BY DRT Performed at [facility], [place] [address], [place], [place] [address]    Report Status 07/20/2022 FINAL  Final  Acid Fast Smear (AFB)     Status: None   Collection Time: 07/20/22  4:50 PM   Specimen: Sputum  Result Value Ref Range Status   AFB Specimen Processing Concentration  Final   Acid Fast Smear Negative  Final    Comment: (NOTE) Performed At: [facility] [place], [place] [id number] Rush Farmer MD TK:[id number]    Source (AFB) EXPECTORATED SPUTUM  Final    Comment: Performed at [facility], [place] [address] [place], [place] [address]  Culture, blood (Routine X 2) w Reflex to ID Panel     Status: None   Collection Time: 07/20/22  6:24 PM   Specimen: BLOOD  Result Value Ref Range Status   Specimen Description BLOOD BLOOD RIGHT FOREARM  Final   Special Requests   Final    BOTTLES DRAWN AEROBIC AND ANAEROBIC Blood Culture results may not be optimal due to an excessive volume of blood received in culture bottles   Culture   Final    NO GROWTH 5 DAYS Performed at [facility], [place] [address], [place], [place] [address]    Report Status 07/25/2022 FINAL  Final  Culture, blood (Routine X 2) w Reflex to ID Panel     Status: None   Collection Time: 07/20/22 10:58 PM   Specimen: BLOOD  Result Value Ref Range Status   Specimen Description BLOOD LEFT ANTECUBITAL  Final   Special Requests   Final    BOTTLES DRAWN AEROBIC AND ANAEROBIC Blood Culture adequate volume   Culture   Final    NO GROWTH 5 DAYS Performed at [facility], [place] [address], [place], [place] [address]    Report Status 07/25/2022 FINAL  Final     Labs: BNP (last 3 results) Recent Labs    07/20/22 1430  BNP 798.9*   Basic Metabolic  Panel: Recent Labs  Lab 07/20/22 1430 07/21/22 0044 07/24/22 0215  NA 136 138 134*  K 3.3* 4.1 3.9  CL 101 99 98  CO2 '27 27 30  '$ GLUCOSE 115* 139* 106*  BUN 6* 6* 17  CREATININE 0.54 0.63 0.64  CALCIUM 9.0 8.7* 8.7*  MG  --  1.9 2.3  PHOS  --   --  2.5   Liver Function Tests: Recent Labs  Lab 07/24/22 0215  AST 24  ALT 16  ALKPHOS 77  BILITOT 0.6  PROT 6.0*  ALBUMIN 2.6*   No results for input(s): "LIPASE", "AMYLASE" in the last 168 hours. No results for input(s): "AMMONIA" in the last 168 hours. CBC: Recent Labs  Lab 07/20/22 1430 07/21/22 0044 07/24/22 0215  WBC 6.1 3.9* 7.6  NEUTROABS  --   --  6.0  HGB 11.8* 10.9* 10.9*  HCT 37.7 34.2* 34.3*  MCV 89.1 88.4 89.1  PLT 322 319 361   Cardiac Enzymes: No results for input(s): "CKTOTAL", "CKMB", "CKMBINDEX", "TROPONINI" in the last 168 hours. BNP: Invalid input(s): "POCBNP" CBG: Recent Labs  Lab 07/23/22 2149  GLUCAP 118*   D-Dimer No results for input(s): "DDIMER" in the last 72 hours. Hgb A1c No results for input(s): "HGBA1C" in the last 72 hours. Lipid Profile No results for input(s): "CHOL", "HDL", "LDLCALC", "TRIG", "CHOLHDL", "LDLDIRECT" in the last 72 hours. Thyroid function studies No results for input(s): "TSH", "T4TOTAL", "T3FREE", "THYROIDAB" in the  last 72 hours.  Invalid input(s): "FREET3" Anemia work up No results for input(s): "VITAMINB12", "FOLATE", "FERRITIN", "TIBC", "IRON", "RETICCTPCT" in the last 72 hours. Urinalysis    Component Value Date/Time   COLORURINE STRAW (A) 07/20/2022 2033   APPEARANCEUR CLEAR 07/20/2022 2033   LABSPEC 1.005 07/20/2022 2033   PHURINE 6.0 07/20/2022 2033   GLUCOSEU NEGATIVE 07/20/2022 2033   HGBUR NEGATIVE 07/20/2022 2033   BILIRUBINUR NEGATIVE 07/20/2022 2033   BILIRUBINUR Neg 02/06/2012 [place] 07/20/2022 2033   PROTEINUR NEGATIVE 07/20/2022 2033   UROBILINOGEN 0.2 04/07/2012 1443   NITRITE NEGATIVE 07/20/2022 2033    LEUKOCYTESUR NEGATIVE 07/20/2022 2033   Sepsis Labs Recent Labs  Lab 07/20/22 1430 07/21/22 0044 07/24/22 0215  WBC 6.1 3.9* 7.6   Microbiology Recent Results (from the past 240 hour(s))  Resp panel by RT-PCR (RSV, Flu A&B, Covid) Anterior Nasal Swab     Status: Abnormal   Collection Time: 07/20/22  3:04 PM   Specimen: Anterior Nasal Swab  Result Value Ref Range Status   SARS Coronavirus 2 by RT PCR NEGATIVE NEGATIVE Final    Comment: (NOTE) SARS-CoV-2 target nucleic acids are NOT DETECTED.  The SARS-CoV-2 RNA is generally detectable in upper respiratory specimens during the acute phase of infection. The lowest concentration of SARS-CoV-2 viral copies this assay can detect is 138 copies/mL. A negative result does not preclude SARS-Cov-2 infection and should not be used as the sole basis for treatment or other patient management decisions. A negative result may occur with  improper specimen collection/handling, submission of specimen other than nasopharyngeal swab, presence of viral mutation(s) within the areas targeted by this assay, and inadequate number of viral copies(<138 copies/mL). A negative result must be combined with clinical observations, patient history, and epidemiological information. The expected result is Negative.  Fact Sheet for Patients:  EntrepreneurPulse.com.au  Fact Sheet for Healthcare Providers:  IncredibleEmployment.be  This test is no t yet approved or cleared by the [place] FDA and  has been authorized for detection and/or diagnosis of SARS-CoV-2 by FDA under an Emergency Use Authorization (EUA). This EUA will remain  in effect (meaning this test can be used) for the duration of the COVID-19 declaration under Section 564(b)(1) of the Act, 21 U.S.C.section 360bbb-3(b)(1), unless the authorization is terminated  or revoked sooner.       Influenza A by PCR NEGATIVE NEGATIVE Final   Influenza B by PCR  NEGATIVE NEGATIVE Final    Comment: (NOTE) The Xpert Xpress SARS-CoV-2/FLU/RSV plus assay is intended as an aid in the diagnosis of influenza from Nasopharyngeal swab specimens and should not be used as a sole basis for treatment. Nasal washings and aspirates are unacceptable for Xpert Xpress SARS-CoV-2/FLU/RSV testing.  Fact Sheet for Patients: EntrepreneurPulse.com.au  Fact Sheet for Healthcare Providers: IncredibleEmployment.be  This test is not yet approved or cleared by the [place] FDA and has been authorized for detection and/or diagnosis of SARS-CoV-2 by FDA under an Emergency Use Authorization (EUA). This EUA will remain in effect (meaning this test can be used) for the duration of the COVID-19 declaration under Section 564(b)(1) of the Act, 21 U.S.C. section 360bbb-3(b)(1), unless the authorization is terminated or revoked.     Resp Syncytial Virus by PCR POSITIVE (A) NEGATIVE Final    Comment: (NOTE) Fact Sheet for Patients: EntrepreneurPulse.com.au  Fact Sheet for Healthcare Providers: IncredibleEmployment.be  This test is not yet approved or cleared by the [place] FDA and has been authorized for detection and/or  diagnosis of SARS-CoV-2 by FDA under an Emergency Use Authorization (EUA). This EUA will remain in effect (meaning this test can be used) for the duration of the COVID-19 declaration under Section 564(b)(1) of the Act, 21 U.S.C. section 360bbb-3(b)(1), unless the authorization is terminated or revoked.  Performed at [facility], [place] [address], [place], [place] [address]   Respiratory (~20 pathogens) panel by PCR     Status: Abnormal   Collection Time: 07/20/22  3:04 PM   Specimen: Nasopharyngeal Swab; Respiratory  Result Value Ref Range Status   Adenovirus NOT DETECTED NOT DETECTED Final   Coronavirus 229E NOT DETECTED NOT DETECTED Final    Comment: (NOTE) The  Coronavirus on the Respiratory Panel, DOES NOT test for the novel  Coronavirus (2019 nCoV)    Coronavirus HKU1 NOT DETECTED NOT DETECTED Final   Coronavirus NL63 NOT DETECTED NOT DETECTED Final   Coronavirus OC43 NOT DETECTED NOT DETECTED Final   Metapneumovirus NOT DETECTED NOT DETECTED Final   Rhinovirus / Enterovirus NOT DETECTED NOT DETECTED Final   Influenza A NOT DETECTED NOT DETECTED Final   Influenza B NOT DETECTED NOT DETECTED Final   Parainfluenza Virus 1 NOT DETECTED NOT DETECTED Final   Parainfluenza Virus 2 NOT DETECTED NOT DETECTED Final   Parainfluenza Virus 3 NOT DETECTED NOT DETECTED Final   Parainfluenza Virus 4 NOT DETECTED NOT DETECTED Final   Respiratory Syncytial Virus DETECTED (A) NOT DETECTED Final   Bordetella pertussis NOT DETECTED NOT DETECTED Final   Bordetella Parapertussis NOT DETECTED NOT DETECTED Final   Chlamydophila pneumoniae NOT DETECTED NOT DETECTED Final   Mycoplasma pneumoniae NOT DETECTED NOT DETECTED Final    Comment: Performed at [facility], [place]. [address] [place], [place] [address]  Expectorated Sputum Assessment w Gram Stain, Rflx to Resp Cult     Status: None   Collection Time: 07/20/22  4:50 PM   Specimen: Expectorated Sputum  Result Value Ref Range Status   Specimen Description EXPECTORATED SPUTUM  Final   Special Requests NONE  Final   Sputum evaluation   Final    Sputum specimen not acceptable for testing.  Please recollect.   NOTIFIED RN LINDSEY LEGO ON 07/20/22 @ 2239 BY DRT Performed at [facility], [place] [address] [place], [place] [address]    Report Status 07/20/2022 FINAL  Final  Acid Fast Smear (AFB)     Status: None   Collection Time: 07/20/22  4:50 PM   Specimen: Sputum  Result Value Ref Range Status   AFB Specimen Processing Concentration  Final   Acid Fast Smear Negative  Final    Comment: (NOTE) Performed At: [facility] [place], [place] [id number] Rush Farmer MD  IO:[id number]    Source (AFB) EXPECTORATED SPUTUM  Final    Comment: Performed at [facility], [place] [address], [place], [place] [address]  Culture, blood (Routine X 2) w Reflex to ID Panel     Status: None   Collection Time: 07/20/22  6:24 PM   Specimen: BLOOD  Result Value Ref Range Status   Specimen Description BLOOD BLOOD RIGHT FOREARM  Final   Special Requests   Final    BOTTLES DRAWN AEROBIC AND ANAEROBIC Blood Culture results may not be optimal due to an excessive volume of blood received in culture bottles   Culture   Final    NO GROWTH 5 DAYS Performed at [facility], [place] [address], [place], [place]  [address]    Report Status 07/25/2022 FINAL  Final  Culture, blood (Routine X 2) w Reflex to ID Panel     Status: None   Collection Time: 07/20/22 10:58 PM   Specimen: BLOOD  Result Value Ref Range Status   Specimen Description BLOOD LEFT ANTECUBITAL  Final   Special Requests   Final    BOTTLES DRAWN AEROBIC AND ANAEROBIC Blood Culture adequate volume   Culture   Final    NO GROWTH 5 DAYS Performed at [facility], [place] [address], [place], [place] [address]    Report Status 07/25/2022 FINAL  Final     Time coordinating discharge:  35 minutes  SIGNED:   Barb Merino, MD  [facility] 07/26/2022, 11:25 AM

## 2022-07-27 DIAGNOSIS — F339 Major depressive disorder, recurrent, unspecified: Secondary | ICD-10-CM | POA: Diagnosis not present

## 2022-07-27 DIAGNOSIS — J9601 Acute respiratory failure with hypoxia: Secondary | ICD-10-CM | POA: Diagnosis not present

## 2022-07-27 DIAGNOSIS — R531 Weakness: Secondary | ICD-10-CM | POA: Diagnosis not present

## 2022-07-27 DIAGNOSIS — E440 Moderate protein-calorie malnutrition: Secondary | ICD-10-CM | POA: Diagnosis not present

## 2022-07-27 DIAGNOSIS — I482 Chronic atrial fibrillation, unspecified: Secondary | ICD-10-CM | POA: Diagnosis not present

## 2022-07-27 DIAGNOSIS — K219 Gastro-esophageal reflux disease without esophagitis: Secondary | ICD-10-CM | POA: Diagnosis not present

## 2022-07-27 DIAGNOSIS — E039 Hypothyroidism, unspecified: Secondary | ICD-10-CM | POA: Diagnosis not present

## 2022-07-27 DIAGNOSIS — J849 Interstitial pulmonary disease, unspecified: Secondary | ICD-10-CM | POA: Diagnosis not present

## 2022-07-30 ENCOUNTER — Non-Acute Institutional Stay: Payer: Medicare Other | Admitting: Family Medicine

## 2022-07-30 ENCOUNTER — Encounter: Payer: Self-pay | Admitting: Family Medicine

## 2022-07-30 VITALS — BP 128/70 | HR 88 | Temp 98.0°F | Resp 38

## 2022-07-30 DIAGNOSIS — B338 Other specified viral diseases: Secondary | ICD-10-CM | POA: Diagnosis not present

## 2022-07-30 DIAGNOSIS — J849 Interstitial pulmonary disease, unspecified: Secondary | ICD-10-CM

## 2022-07-30 DIAGNOSIS — K921 Melena: Secondary | ICD-10-CM | POA: Diagnosis not present

## 2022-07-30 DIAGNOSIS — R09A2 Foreign body sensation, throat: Secondary | ICD-10-CM | POA: Diagnosis not present

## 2022-07-30 DIAGNOSIS — Z515 Encounter for palliative care: Secondary | ICD-10-CM | POA: Diagnosis not present

## 2022-07-30 DIAGNOSIS — R6881 Early satiety: Secondary | ICD-10-CM

## 2022-07-30 DIAGNOSIS — E440 Moderate protein-calorie malnutrition: Secondary | ICD-10-CM

## 2022-07-30 DIAGNOSIS — R1111 Vomiting without nausea: Secondary | ICD-10-CM | POA: Insufficient documentation

## 2022-07-30 NOTE — Progress Notes (Signed)
[facility] Palliative Care Consult Note Telephone: [phone]  Fax: [phone]   Date of encounter: 07/30/22 12:35 PM PATIENT NAME: Stephanie Cortez [address] [place] [address]   [phone] (home) [phone] (work) DOB: 23-Feb-1934 MRN: [id number] PRIMARY CARE PROVIDER:    Marin Olp, MD,  [place] [address] [phone]  REFERRING PROVIDER:   Marin Cortez, Stephanie Cortez,  [place] [address] [phone]  RESPONSIBLE PARTY:    Contact Information     Name Relation Home Work Mobile   Stephanie Cortez Daughter [phone]  [phone]   Stephanie Cortez [phone]          I met face to face with patient in [place] and [place]. Palliative Care was asked to follow this patient by consultation request of  Stephanie Olp, MD/Dr Merilynn Finland, facility MD for [facility] to address advance care planning and complex medical decision making. This is the initial visit.          ASSESSMENT, SYMPTOM MANAGEMENT AND PLAN / RECOMMENDATIONS:  ILD/RSV Question acute exacerbation of ILD due to RSV  Recommend scheduling follow up with Dr Chase Caller, Pulmonologist (hospital paperwork recommended 1 week) O2 sats stable on no O2 at 98% but with increased work of breathing and tachypnea, chest pressure Portable CXR to check if superimposed Pneumonia  Globus sensation/early satiety/vomiting/blood in stool Has hx of mesenteric ischemia, current constipation with straining. Has osteophytes on C4-6, not noted as obstructing on MBS Obtain portable KUB to assess for obstruction/ileus Primary team arranging a GI consult. Agree with meds crushed in applesauce HGB has been stable in 10.-10.9 range since [place]   Moderate protein calorie malnutrition Agree with supplement, may need smaller more frequent meals with concentrated protein source. Address etiology of  vomiting Advised daughter that it is critically important that we improve pt's nutrition for her to be able to participate with therapy and generally recover well.  4.   Palliative Care Encounter Discussion of MOST form and decisions it involves. Email of sample [place] MOST form to daughter Plan visit to discuss goals of care when son is present as well as daughter Discussed qualifications and services offered by MOST with daughter (will discuss at next visit with pt)   Follow up Palliative Care Visit: Palliative care will continue to follow for complex medical decision making, advance care planning, and clarification of goals. Return 2-4 weeks or prn.    This visit was coded based on medical decision making (MDM).  PPS: 40%  HOSPICE ELIGIBILITY/DIAGNOSIS: TBD  Chief Complaint: Stephanie Cortez received a referral to follow up with patient for chronic disease management in setting of ILD with acute hypoxemic respiratory failure due to ILD with RSV.  Palliative Care is also following to assist with defining/refining goals of care.     HISTORY OF PRESENT ILLNESS:  Stephanie Cortez is a 89 y.o. year old female with acute exacerbation of chronic ILD due to RSV infection with acute hypoxemic respiratory failure.   Permanent atrial fibrillation, orthostatic lightheadedness, coronary artery calcification onn CT, Heart failure with preserved EF, bronchiectasis, chronic sinusitis, cricopharyngeal achalasia, GERD, sensorineural hearing loss,  At home pt was noted to be confused with worsening DOE and progressive lethargy.  EMS was called and O2 sats were noted to be 77%. CXR showed severe chronic interstitial lung disease with upper lobe predominance, increased from prior exam.  Respiratory virus panel positive for RSV.  She  was made DNR with request for Palliative follow up while in the facility to improve mobility and possibly home with Hospice. Requested follow up  with Dr  Chase Caller in 1 week for hospital follow up.  Pt previously had Covid infection on 04/27/22 with positive test following dental extraction. Plan is to go home independently and daughter lives close by.  Has some in home help as needed. Facility staff indicated that they have not seen blood in bowel movements and that pt has not had a BM on day shift either Friday or today nor where they aware of pt coughing and spitting up some blood.  Spoke with Stephanie Cortez, facility nurse who states that pt did have some vomiting x 3 today.  She states that facility PA is wanting to stop Prednisone and continue BID DuoNeb instead.  She states there has been some discussion about a GI consult and I encouraged her to speak with the pt's daughter.  Spoke with Stephanie Cortez, daughter later in the evening to discuss findings today.  She states that her mother started out in 2021 with the hip fracture, lost her favorite 67 spaniel and had found one similar to the one she lost and then with having to have a 2nd hip surgery so close to the first she wound up having to stop driving, give up her dog.  She states that pt is still married to her father although they have not lived together.  She states that even prior to Covid she had in October 2023 and following RSV she had a sensation of something in her throat and would try to forcefully cough it up (huff cough).  She agreed her mother was eating small meals but did not have any vomiting. Reports she had her esophagus stretched several years ago.  She is in agreement with GI consult if only to find out if there is a problem and what can be done about it.  Advised of plan. She states her mother has not wanted to consider Hospice or talk about designating a [place] POA.  She said that her mother has a belief that going with hospice means that she is dying now.  She states that her brother may be coming in from [place] soon to visit.  History obtained from review of EMR, discussion with family, facility  staff/caregiver and/or Stephanie Cortez.   07/23/22 DG Swallowing (MBS) Eval: Longstanding hx of cricopharyngeal achalasia, previously treated with Botox. Has noted osteophytes at C4-6 as well as CP bar which protrude into esophageal space but does not inhibit swallow.  Full airway protection noted  07/20/22 Portable CXR as noted above in HPI. 07/20/22 Resp Virus Panel negative for SARS-CoV-2, Influenza A & B.  Positive for RSV 07/20/22 Respiratory Biofire negative except for RSV.  AFB negative.  Blood Cultures negative x 2. 07/20/22 BNP elevated at 155.6     Latest Ref Rng & Units 07/24/2022    2:15 AM 07/21/2022   12:44 AM 07/20/2022    2:30 PM  CBC  WBC 4.0 - 10.5 K/uL 7.6  3.9  6.1   Hemoglobin 12.0 - 15.0 g/dL 10.9  10.9  11.8   Hematocrit 36.0 - 46.0 % 34.3  34.2  37.7   Platelets 150 - 400 K/uL 361  319  322        Latest Ref Rng & Units 07/24/2022    2:15 AM 07/21/2022   12:44 AM 07/20/2022    2:30 PM  CMP  Glucose 70 - 99  mg/dL 106  139  115   BUN 8 - 23 mg/dL '17  6  6   '$ Creatinine 0.44 - 1.00 mg/dL 0.64  0.63  0.54   Sodium 135 - 145 mmol/L 134  138  136   Potassium 3.5 - 5.1 mmol/L 3.9  4.1  3.3   Chloride 98 - 111 mmol/L 98  99  101   CO2 22 - 32 mmol/L '30  27  27   '$ Calcium 8.9 - 10.3 mg/dL 8.7  8.7  9.0   Total Protein 6.5 - 8.1 g/dL 6.0     Total Bilirubin 0.3 - 1.2 mg/dL 0.6     Alkaline Phos 38 - 126 U/L 77     AST 15 - 41 U/L 24     ALT 0 - 44 U/L 16          Latest Ref Rng & Units 07/24/2022    2:15 AM 07/06/2022    4:10 PM 04/16/2022    4:41 PM  Hepatic Function  Total Protein 6.5 - 8.1 g/dL 6.0  6.9  7.2   Albumin 3.5 - 5.0 g/dL 2.6   3.9   AST 15 - 41 U/L '24  19  21   '$ ALT 0 - 44 U/L '16  11  12   '$ Alk Phosphatase 38 - 126 U/L 77   124   Total Bilirubin 0.3 - 1.2 mg/dL 0.6  0.7  0.7    07/20/22 UA negative   I reviewed EMR for available labs, medications, imaging, studies and related documents.  Records reviewed and summarized above.   ROS General: NAD EYES:  denies vision changes ENMT: endorses dysphagia and Globus sensation Cardiovascular: endorses substernal chest heaviness, endorses DOE Pulmonary: endorses productive cough with blood streaks in it, endorses increased SOB Abdomen: endorses globus sensation and regurgitation of liquids, endorses doing well with soft solids, Has had some heartburn. Feels like something in the throat "but I can't get it out"  Poor appetite, endorses constipation helped by Miralax, Notes bright red blood in stool and straining. Having to have crushed meds in applesauce. Early satiety--eats smaller meals at home (snacking). Continent of bowel. GU: denies dysuria, endorses continence of urine MSK:  endorses poor endurance and increased  weakness of right hip since fracture 2-3 years ago, can only ambulate with assistance, no falls reported Skin: denies rashes or wounds Neurological: denies pain, denies insomnia Psych: Endorses depressed mood, not understanding why she is her and what to do Heme/lymph/immuno: denies bruises, endorses abnormal bleeding  Physical Exam: Current and past weights: 127 lbs 10.3 oz as of 07/26/22, weight on 04/16/22 147 lbs 3.2 oz Constitutional: NAD, non-toxic in appearance General: frail appearing, thin  EYES: anicteric sclera, lids intact, no discharge  ENMT: intact hearing, oral mucous membranes moist, CV: S1S2, mildly irregular, no LE edema Pulmonary: right lung with fine crackles throughout/leftbasilar crackles and bibasilar rub, tachypneic with rate 38, no cough, O2 @ 2L [place] Abdomen: normo-active BS + 4 quadrants, soft and non tender GU: deferred MSK: no sarcopenia, moves all extremities,  Skin: warm and dry, no rashes or wounds on visible skin Neuro:  no generalized weakness, no cognitive impairment Psych: non-anxious affect, A and O x 3 Hem/lymph/immuno: no widespread bruising  CURRENT PROBLEM LIST:  Patient Active Problem List   Diagnosis Date Noted   DNR (do not resuscitate)  07/21/2022   RSV (respiratory syncytial virus infection) 07/20/2022   Hyperlipidemia, unspecified 04/16/2022   Palliative care by specialist 07/27/2021  Constipation 07/27/2021   Cricopharyngeal achalasia 06/24/2021   Disorder of cervical esophageal region 06/23/2021   Acute respiratory failure with hypoxia (HCC) 06/18/2021   Chronic anemia 06/18/2021   Chronic pain of right knee 06/20/2020   S/P percutaneous endoscopic gastrostomy (PEG) tube placement ([place])    Debility 02/17/2020   Periprosthetic fracture around internal prosthetic hip joint 11/16/2019   Periprosthetic fracture around internal prosthetic hip joint, initial encounter    Essential hypertension    Chronic anticoagulation    Acute blood loss anemia    Subcapital fracture of femur ([place]) 10/30/2019   Leukocytosis    Hoarseness 02/19/2018   Sensorineural hearing loss (SNHL) of both ears 02/19/2018   Tinnitus, bilateral 02/19/2018   Cough 09/06/2017   NSIP (nonspecific interstitial pneumonia) ([place]) 09/26/2015   Chronic sinusitis 09/26/2015   ILD (interstitial lung disease) ([place]) 07/12/2014   Upper airway cough syndrome 09/23/2013   Heart failure with preserved ejection fraction ([place]) 07/08/2013   Coronary artery calcification seen on CAT scan 03/15/2013   Nodule of left lung 03/15/2013   Orthostatic lightheadedness 02/09/2013   Depression    GERD (gastroesophageal reflux disease) 01/01/2012   Hypothyroid 11/23/2010   Edema 10/04/2010   Bronchiectasis ([place])    Permanent atrial fibrillation ([place])    PAST MEDICAL HISTORY:  Active Ambulatory Problems    Diagnosis Date Noted   Permanent atrial fibrillation ([place])    Bronchiectasis ([place])    Edema 10/04/2010   Hypothyroid 11/23/2010   GERD (gastroesophageal reflux disease) 01/01/2012   Depression    Orthostatic lightheadedness 02/09/2013   Coronary artery calcification seen on CAT scan 03/15/2013   Nodule of left lung 03/15/2013   Heart failure with preserved  ejection fraction ([place]) 07/08/2013   Upper airway cough syndrome 09/23/2013   ILD (interstitial lung disease) ([place]) 07/12/2014   NSIP (nonspecific interstitial pneumonia) ([place]) 09/26/2015   Chronic sinusitis 09/26/2015   Cough 09/06/2017   Hoarseness 02/19/2018   Sensorineural hearing loss (SNHL) of both ears 02/19/2018   Tinnitus, bilateral 02/19/2018   Subcapital fracture of femur ([place]) 10/30/2019   Leukocytosis    Acute blood loss anemia    Chronic anticoagulation    Essential hypertension    Periprosthetic fracture around internal prosthetic hip joint, initial encounter    Periprosthetic fracture around internal prosthetic hip joint 11/16/2019   Debility 02/17/2020   S/P percutaneous endoscopic gastrostomy (PEG) tube placement (HCC)    Chronic pain of right knee 06/20/2020   Acute respiratory failure with hypoxia ([place]) 06/18/2021   Chronic anemia 06/18/2021   Disorder of cervical esophageal region 06/23/2021   Cricopharyngeal achalasia 06/24/2021   Palliative care by specialist 07/27/2021   Constipation 07/27/2021   Hyperlipidemia, unspecified 04/16/2022   RSV (respiratory syncytial virus infection) 07/20/2022   DNR (do not resuscitate) 07/21/2022   Resolved Ambulatory Problems    Diagnosis Date Noted   ANEMIA, IRON DEFICIENCY 10/22/2008   HYPERTENSION, BENIGN 10/22/2008   Foot pain 11/23/2010   URI, acute 06/19/2011   Sinusitis 08/02/2011   Diverticulitis 01/01/2012   Hyponatremia 04/03/2012   Chest pain 09/03/2012   Preoperative clearance 02/12/2013   Post-nasal drip 03/15/2013   Bronchiectasis with acute exacerbation ([employer]) 05/04/2013   Encounter for therapeutic drug monitoring 08/11/2013   Bronchiectasis without complication ([place]) 71/11/2692   Rhinitis, chronic 06/22/2014   Pulmonary air trapping 01/04/2016   Encounter for preoperative pulmonary examination 01/04/2016   Acute bronchitis 02/20/2016   Cough variant asthma 08/19/2017   Closed right hip fracture,  initial encounter (  [place]) 10/27/2019   Post-operative state    Prerenal azotemia    Transaminitis    Hypoalbuminemia due to protein-calorie malnutrition (HCC)    Labile blood pressure    Drug induced constipation    Supplemental oxygen dependent    Postoperative pain    Vasovagal episode    Elevated WBC count    Chronic obstructive pulmonary disease (HCC)    Sepsis ([place]) 02/08/2020   Small bowel ischemia (HCC) 02/08/2020   Thrombocytosis 02/08/2020   Abnormal CT of the abdomen    Mesenteric ischemia (HCC)    Subtherapeutic international normalized ratio (INR)    Post-operative pain    Malnutrition of moderate degree 02/25/2020   Abnormality of gait 06/20/2020   COVID-19 virus infection 06/17/2021   SOB (shortness of breath) 06/18/2021   Severe sepsis ([place]) 06/18/2021   Chronic diastolic CHF (congestive heart failure) ([place]) 06/18/2021   Pressure injury of skin 06/21/2021   Physical deconditioning 06/21/2021   Past Medical History:  Diagnosis Date   Anemia    Anxiety    Atrial fibrillation (HCC)    Bronchiectasis    CHF (congestive heart failure) (HCC)    COPD (chronic obstructive pulmonary disease) (HCC)    Diverticulosis    Dyslipidemia    Eczema    Fatty liver    GERD with stricture    Glaucoma    H. pylori infection    Hypertension    IBS (irritable bowel syndrome)    Lumbar disc disease    Neuropathy of both feet    Segmental colitis (HCC)    Ventral hernia    Vertigo    Wears glasses    SOCIAL HX:  Social History   Tobacco Use   Smoking status: Never   Smokeless tobacco: Never  Substance Use Topics   Alcohol use: No   FAMILY HX:  Family History  Problem Relation Age of Onset   Hypertension Mother    Stroke Mother    Emphysema Brother    Other Father        miner's lung   Cancer Father        Lung   Colon polyps Sister    Pancreatic cancer Sister    Kidney disease Sister    Atrial fibrillation Other        siblings       Preferred  Pharmacy: ALLERGIES:  Allergies  Allergen Reactions   Doxycycline Other (See Comments)    Rash on face and neck   Tramadol     Constipation, abdominal obstruction leading to weakness after using this   Vicodin [Hydrocodone-Acetaminophen] Other (See Comments)    Hallucinations    Flagyl [Metronidazole] Nausea And Vomiting   Moxifloxacin Other (See Comments)     Weakness/fatigue   Pantoprazole Sodium Rash   Penicillins Rash    Has patient had a PCN reaction causing immediate rash, facial/tongue/throat swelling, SOB or lightheadedness with hypotension:unsure Has patient had a PCN reaction causing severe rash involving mucus membranes or skin necrosis:unsure Has patient had a PCN reaction that required hospitalization:No Has patient had a PCN reaction occurring within the last 10 years:Yes Cannot recall exact reaction due to time lapse If all of the above answers are "NO", then may proceed with Cephalosporin use.  Other reaction(s): Unknown     PERTINENT MEDICATIONS:  Outpatient Encounter Medications as of 07/30/2022  Medication Sig   acetaminophen (TYLENOL) 325 MG tablet Take 2 tablets (650 mg total) by mouth every 6 (six) hours as needed  for mild pain (or Fever >/= 101).   apixaban (ELIQUIS) 2.5 MG TABS tablet Take 1 tablet (2.5 mg total) by mouth 2 (two) times daily. Needs cardiology appt for refills, call office   feeding supplement (ENSURE ENLIVE / ENSURE PLUS) LIQD Take 237 mLs by mouth 2 (two) times daily between meals.   FLUoxetine (PROZAC) 20 MG capsule Take 1 capsule (20 mg total) by mouth daily.   guaiFENesin (MUCINEX) 600 MG 12 hr tablet Take 1 tablet (600 mg total) by mouth 2 (two) times daily.   guaiFENesin-dextromethorphan (ROBITUSSIN DM) 100-10 MG/5ML syrup Take 5 mLs by mouth every 4 (four) hours as needed for cough.   ipratropium-albuterol (DUONEB) 0.5-2.5 (3) MG/3ML SOLN Take 3 mLs by nebulization every 4 (four) hours as needed.   levothyroxine (SYNTHROID) 50 MCG  tablet Take 1 tablet (50 mcg total) by mouth daily.   omeprazole (PRILOSEC) 40 MG capsule Take 1 capsule (40 mg total) by mouth 2 (two) times daily before a meal.   Polyethylene Glycol 3350 (MIRALAX PO) Take by mouth.   pravastatin (PRAVACHOL) 40 MG tablet Take 1 tablet (40 mg total) by mouth at bedtime.   predniSONE (DELTASONE) 10 MG tablet 4 tablet daily for 2 days 3 tablet daily for 2 days 2 tablets daily for 2 days 1 tablet daily for 2 days   psyllium (METAMUCIL) 58.6 % packet Take 1 packet by mouth daily as needed (constipation).   SYMBICORT 80-4.5 MCG/ACT inhaler INHALE 2 PUFFS INTO THE LUNGS TWICE DAILY (Patient taking differently: Inhale 2 puffs into the lungs 2 (two) times daily.)   traZODone (DESYREL) 50 MG tablet Take 1 tablet (50 mg total) by mouth at bedtime as needed.   No facility-administered encounter medications on file as of 07/30/2022.     -------------------------------------------------------------------- Advance Care Planning/Goals of Care:  Health care surrogate gave her permission to discuss.Our advance care planning conversation included a discussion about:    The value and importance of advance care planning and designating a [facility] POA Exploration of personal, cultural or spiritual beliefs that might influence medical decisions-believes that Hospice may mean eminent demise Exploration of goals of care in the event of a sudden injury or illness-has DNR Identification of a healthcare agent -daughter Stephanie Cortez [phone] Review of an advance directive document-DNR (uploaded to [place]) . Decision not to resuscitate or to de-escalate disease focused treatments due to poor prognosis. CODE STATUS: DNR   Thank you for the opportunity to participate in the care of Ms. Zia.  The palliative care team will continue to follow. Please call our office at [phone] if we can be of additional assistance.   Marijo Conception, FNP-C  COVID-19 PATIENT SCREENING TOOL Asked and  negative response unless otherwise noted:  Have you had symptoms of covid, tested positive or been in contact with someone with symptoms/positive test in the past 5-10 days?

## 2022-07-31 ENCOUNTER — Encounter: Payer: Self-pay | Admitting: Internal Medicine

## 2022-08-01 ENCOUNTER — Telehealth: Payer: Self-pay | Admitting: Family Medicine

## 2022-08-01 ENCOUNTER — Encounter: Payer: Self-pay | Admitting: Family Medicine

## 2022-08-01 NOTE — Telephone Encounter (Signed)
Called to give results of CXR and KUB to daughter, [name].  Left message indicating CXR was non-specific about whether or not pt had a developing pneumonia or chronic damage from her ILD.  KUB showed constipation.  Advised that I had recommended to start her on stool softener 2 tabs at night while receiving daily Miralax to try and get things moving.  Provided call back number and encouraged her to reach out for questions or concerns.  Damaris Hippo FNP-

## 2022-08-03 ENCOUNTER — Telehealth: Payer: Self-pay | Admitting: Family Medicine

## 2022-08-03 DIAGNOSIS — R079 Chest pain, unspecified: Secondary | ICD-10-CM

## 2022-08-03 NOTE — Telephone Encounter (Signed)
Almyra Free, nurse from [facility] states pt c/o right upper chest pain, sense of impending death, nausea and belching.  She states pt has been drinking ok.  BP 170s/70s with pulse going from 90s-110 and O2 sat from 90-95%.  Pt has permanent afib on NOAC-Eliquis with recent RSV and chronic ILD.  Advised to give pt 0.4 mg SL NTG to see if this will alleviate pain which she states she had last night as well. Advised to call daughter and let me know if ineffective.  Damaris Hippo FNP-C

## 2022-08-08 ENCOUNTER — Encounter: Payer: Self-pay | Admitting: Family Medicine

## 2022-08-08 ENCOUNTER — Non-Acute Institutional Stay: Payer: Medicare Other | Admitting: Family Medicine

## 2022-08-08 VITALS — BP 122/60 | HR 95 | Temp 97.8°F | Resp 30

## 2022-08-08 DIAGNOSIS — Z515 Encounter for palliative care: Secondary | ICD-10-CM

## 2022-08-08 DIAGNOSIS — R1111 Vomiting without nausea: Secondary | ICD-10-CM

## 2022-08-08 DIAGNOSIS — J849 Interstitial pulmonary disease, unspecified: Secondary | ICD-10-CM

## 2022-08-08 DIAGNOSIS — Z7189 Other specified counseling: Secondary | ICD-10-CM

## 2022-08-08 DIAGNOSIS — H04123 Dry eye syndrome of bilateral lacrimal glands: Secondary | ICD-10-CM

## 2022-08-08 DIAGNOSIS — L299 Pruritus, unspecified: Secondary | ICD-10-CM | POA: Diagnosis not present

## 2022-08-08 DIAGNOSIS — K5900 Constipation, unspecified: Secondary | ICD-10-CM | POA: Diagnosis not present

## 2022-08-08 NOTE — Progress Notes (Unsigned)
[facility] Palliative Care Consult Note Telephone: [phone]  Fax: [phone]   Date of encounter: 08/08/22 8:46 PM PATIENT NAME: Stephanie Cortez [address] [place] [address]   [phone] (home) [phone] (work) DOB: 07/25/1933 MRN: [id number] PRIMARY CARE PROVIDER:    Marin Olp, MD,  [place] [address] [phone]  REFERRING PROVIDER:   Marin Olp, [place] [place],  [place] [address] [phone]  RESPONSIBLE PARTY:    Contact Information     Name Relation Home Work Mobile   [place] Daughter [phone]  [phone]   [name] [phone]          I met face to face with patient in [place] and [place]. Palliative Care was asked to follow this patient by consultation request of  Marin Olp, MD/Dr Merilynn Finland, facility MD for [facility] to address advance care planning and complex medical decision making. This is the initial visit.          ASSESSMENT, SYMPTOM MANAGEMENT AND PLAN / RECOMMENDATIONS:  ILD/RSV Question acute exacerbation of ILD due to RSV  Recommend scheduling follow up with Dr Chase Caller, Pulmonologist (hospital paperwork recommended 1 week) O2 sats stable on no O2 at 98% but with increased work of breathing and tachypnea, chest pressure Portable CXR to check if superimposed Pneumonia  Globus sensation/early satiety/vomiting/blood in stool Has hx of mesenteric ischemia, current constipation with straining. Has osteophytes on C4-6, not noted as obstructing on MBS Obtain portable KUB to assess for obstruction/ileus Primary team arranging a GI consult. Agree with meds crushed in applesauce HGB has been stable in 10.-10.9 range since [place]   Moderate protein calorie malnutrition Agree with supplement, may need smaller more frequent meals with concentrated protein source. Address etiology of  vomiting Advised daughter that it is critically important that we improve pt's nutrition for her to be able to participate with therapy and generally recover well.  4.   Palliative Care Encounter Discussion of MOST form and decisions it involves. Email of sample [facility] MOST form to daughter Plan visit to discuss goals of care when son is present as well as daughter Discussed qualifications and services offered by MOST with daughter (will discuss at next visit with pt)   Follow up Palliative Care Visit: Palliative care will continue to follow for complex medical decision making, advance care planning, and clarification of goals. Return 2-4 weeks or prn.    This visit was coded based on medical decision making (MDM).  PPS: 40%  HOSPICE ELIGIBILITY/DIAGNOSIS: TBD  Chief Complaint: [facility] received a referral to follow up with patient for chronic disease management in setting of ILD with acute hypoxemic respiratory failure due to ILD with RSV.  Palliative Care is also following to assist with defining/refining goals of care.     HISTORY OF PRESENT ILLNESS:  Stephanie Cortez is a 64 y.o. year old female with acute exacerbation of chronic ILD due to RSV infection with acute hypoxemic respiratory failure.   Permanent atrial fibrillation, orthostatic lightheadedness, coronary artery calcification onn CT, Heart failure with preserved EF, bronchiectasis, chronic sinusitis, cricopharyngeal achalasia, GERD, sensorineural hearing loss. Met with pt, daughter [name] and son-in-law Dian Situ and son Lennette Bihari.   History obtained from review of EMR, discussion with family, facility staff/caregiver and/or Ms. Surman.     I reviewed EMR for available labs, medications, imaging, studies and related documents.  Records reviewed and summarized above.  ROS General: NAD EYES: denies vision changes ENMT: endorses dysphagia and Globus sensation Cardiovascular: endorses substernal chest  heaviness, endorses DOE Pulmonary: endorses productive cough with blood streaks in it, endorses increased SOB Abdomen: endorses globus sensation and regurgitation of liquids, endorses doing well with soft solids, Has had some heartburn. Feels like something in the throat "but I can't get it out"  Poor appetite, endorses constipation helped by Miralax, Notes bright red blood in stool and straining. Having to have crushed meds in applesauce. Early satiety--eats smaller meals at home (snacking). Continent of bowel. GU: denies dysuria, endorses continence of urine MSK:  endorses poor endurance and increased  weakness of right hip since fracture 2-3 years ago, can only ambulate with assistance, no falls reported Skin: denies rashes or wounds Neurological: denies pain, denies insomnia Psych: Endorses depressed mood, not understanding why she is her and what to do Heme/lymph/immuno: denies bruises, endorses abnormal bleeding  Physical Exam: Current and past weights: 127 lbs 10.3 oz as of 07/26/22, weight on 04/16/22 147 lbs 3.2 oz Constitutional: NAD, non-toxic in appearance General: frail appearing, thin  EYES: anicteric sclera, lids intact, no discharge  ENMT: intact hearing, oral mucous membranes moist, CV: S1S2, mildly irregular, no LE edema Pulmonary: right lung with fine crackles throughout/leftbasilar crackles and bibasilar rub, tachypneic with rate 38, no cough, O2 @ 2L [place] Abdomen: normo-active BS + 4 quadrants, soft and non tender GU: deferred MSK: no sarcopenia, moves all extremities,  Skin: warm and dry, no rashes or wounds on visible skin Neuro:  no generalized weakness, no cognitive impairment Psych: non-anxious affect, A and O x 3 Hem/lymph/immuno: no widespread bruising  CURRENT PROBLEM LIST:  Patient Active Problem List   Diagnosis Date Noted   Globus sensation 07/30/2022   Vomiting without nausea 07/30/2022   Early satiety 07/30/2022   Blood in stool 07/30/2022   DNR (do not  resuscitate) 07/21/2022   RSV (respiratory syncytial virus infection) 07/20/2022   Hyperlipidemia, unspecified 04/16/2022   Palliative care encounter 07/27/2021   Constipation 07/27/2021   Cricopharyngeal achalasia 06/24/2021   Disorder of cervical esophageal region 06/23/2021   Acute respiratory failure with hypoxia ([place]) 06/18/2021   Chronic anemia 06/18/2021   Chronic pain of right knee 06/20/2020   Moderate protein-calorie malnutrition ([place]) 02/25/2020   S/P percutaneous endoscopic gastrostomy (PEG) tube placement ([place])    Debility 02/17/2020   Periprosthetic fracture around internal prosthetic hip joint 11/16/2019   Periprosthetic fracture around internal prosthetic hip joint, initial encounter    Essential hypertension    Chronic anticoagulation    Acute blood loss anemia    Subcapital fracture of femur ([place]) 10/30/2019   Leukocytosis    Hoarseness 02/19/2018   Sensorineural hearing loss (SNHL) of both ears 02/19/2018   Tinnitus, bilateral 02/19/2018   Cough 09/06/2017   NSIP (nonspecific interstitial pneumonia) ([place]) 09/26/2015   Chronic sinusitis 09/26/2015   ILD (interstitial lung disease) ([place]) 07/12/2014   Upper airway cough syndrome 09/23/2013   Heart failure with preserved ejection fraction ([place]) 07/08/2013   Coronary artery calcification seen on CAT scan 03/15/2013   Nodule of left lung 03/15/2013   Orthostatic lightheadedness 02/09/2013   Chest pain 09/03/2012   Depression    GERD (gastroesophageal reflux disease) 01/01/2012   Hypothyroid 11/23/2010   Edema 10/04/2010   Bronchiectasis ([place])    Permanent atrial fibrillation ([place])    PAST MEDICAL HISTORY:  Active Ambulatory Problems    Diagnosis Date Noted   Permanent atrial fibrillation ([place])  Bronchiectasis ([place])    Edema 10/04/2010   Hypothyroid 11/23/2010   GERD (gastroesophageal reflux disease) 01/01/2012   Depression    Chest pain 09/03/2012   Orthostatic lightheadedness 02/09/2013   Coronary  artery calcification seen on CAT scan 03/15/2013   Nodule of left lung 03/15/2013   Heart failure with preserved ejection fraction ([place]) 07/08/2013   Upper airway cough syndrome 09/23/2013   ILD (interstitial lung disease) ([place]) 07/12/2014   NSIP (nonspecific interstitial pneumonia) ([place]) 09/26/2015   Chronic sinusitis 09/26/2015   Cough 09/06/2017   Hoarseness 02/19/2018   Sensorineural hearing loss (SNHL) of both ears 02/19/2018   Tinnitus, bilateral 02/19/2018   Subcapital fracture of femur ([place]) 10/30/2019   Leukocytosis    Acute blood loss anemia    Chronic anticoagulation    Essential hypertension    Periprosthetic fracture around internal prosthetic hip joint, initial encounter    Periprosthetic fracture around internal prosthetic hip joint 11/16/2019   Debility 02/17/2020   S/P percutaneous endoscopic gastrostomy (PEG) tube placement (HCC)    Moderate protein-calorie malnutrition ([place]) 02/25/2020   Chronic pain of right knee 06/20/2020   Acute respiratory failure with hypoxia ([place]) 06/18/2021   Chronic anemia 06/18/2021   Disorder of cervical esophageal region 06/23/2021   Cricopharyngeal achalasia 06/24/2021   Palliative care encounter 07/27/2021   Constipation 07/27/2021   Hyperlipidemia, unspecified 04/16/2022   RSV (respiratory syncytial virus infection) 07/20/2022   DNR (do not resuscitate) 07/21/2022   Globus sensation 07/30/2022   Vomiting without nausea 07/30/2022   Early satiety 07/30/2022   Blood in stool 07/30/2022   Resolved Ambulatory Problems    Diagnosis Date Noted   ANEMIA, IRON DEFICIENCY 10/22/2008   HYPERTENSION, BENIGN 10/22/2008   Foot pain 11/23/2010   URI, acute 06/19/2011   Sinusitis 08/02/2011   Diverticulitis 01/01/2012   Hyponatremia 04/03/2012   Preoperative clearance 02/12/2013   Post-nasal drip 03/15/2013   Bronchiectasis with acute exacerbation ([place]) 05/04/2013   Encounter for therapeutic drug monitoring 08/11/2013    Bronchiectasis without complication ([place]) 32/35/5732   Rhinitis, chronic 06/22/2014   Pulmonary air trapping 01/04/2016   Encounter for preoperative pulmonary examination 01/04/2016   Acute bronchitis 02/20/2016   Cough variant asthma 08/19/2017   Closed right hip fracture, initial encounter ([place]) 10/27/2019   Post-operative state    Prerenal azotemia    Transaminitis    Hypoalbuminemia due to protein-calorie malnutrition (HCC)    Labile blood pressure    Drug induced constipation    Supplemental oxygen dependent    Postoperative pain    Vasovagal episode    Elevated WBC count    Chronic obstructive pulmonary disease ([place])    Sepsis ([place]) 02/08/2020   Small bowel ischemia ([place]) 02/08/2020   Thrombocytosis 02/08/2020   Abnormal CT of the abdomen    Mesenteric ischemia (HCC)    Subtherapeutic international normalized ratio (INR)    Post-operative pain    Abnormality of gait 06/20/2020   COVID-19 virus infection 06/17/2021   SOB (shortness of breath) 06/18/2021   Severe sepsis ([place]) 06/18/2021   Chronic diastolic CHF (congestive heart failure) ([place]) 06/18/2021   Pressure injury of skin 06/21/2021   Physical deconditioning 06/21/2021   Past Medical History:  Diagnosis Date   Anemia    Anxiety    Atrial fibrillation (HCC)    Bronchiectasis    CHF (congestive heart failure) (HCC)    COPD (chronic obstructive pulmonary disease) ([place])    Diverticulosis    Dyslipidemia    Eczema  Fatty liver    GERD with stricture    Glaucoma    H. pylori infection    Hypertension    IBS (irritable bowel syndrome)    Lumbar disc disease    Neuropathy of both feet    Segmental colitis (HCC)    Ventral hernia    Vertigo    Wears glasses    SOCIAL HX:  Social History   Tobacco Use   Smoking status: Never   Smokeless tobacco: Never  Substance Use Topics   Alcohol use: No   FAMILY HX:  Family History  Problem Relation Age of Onset   Hypertension Mother    Stroke Mother     Emphysema Brother    Other Father        miner's lung   Cancer Father        Lung   Colon polyps Sister    Pancreatic cancer Sister    Kidney disease Sister    Atrial fibrillation Other        siblings       Preferred Pharmacy: [facility]:  Allergies  Allergen Reactions   Doxycycline Other (See Comments)    Rash on face and neck   Tramadol     Constipation, abdominal obstruction leading to weakness after using this   Vicodin [Hydrocodone-Acetaminophen] Other (See Comments)    Hallucinations    Flagyl [Metronidazole] Nausea And Vomiting   Moxifloxacin Other (See Comments)     Weakness/fatigue   Pantoprazole Sodium Rash   Penicillins Rash    Has patient had a PCN reaction causing immediate rash, facial/tongue/throat swelling, SOB or lightheadedness with hypotension:unsure Has patient had a PCN reaction causing severe rash involving mucus membranes or skin necrosis:unsure Has patient had a PCN reaction that required hospitalization:No Has patient had a PCN reaction occurring within the last 10 years:Yes Cannot recall exact reaction due to time lapse If all of the above answers are "NO", then may proceed with Cephalosporin use.  Other reaction(s): Unknown     PERTINENT MEDICATIONS:  Outpatient Encounter Medications as of 08/08/2022  Medication Sig   acetaminophen (TYLENOL) 325 MG tablet Take 2 tablets (650 mg total) by mouth every 6 (six) hours as needed for mild pain (or Fever >/= 101).   apixaban (ELIQUIS) 2.5 MG TABS tablet Take 1 tablet (2.5 mg total) by mouth 2 (two) times daily. Needs cardiology appt for refills, call office   feeding supplement (ENSURE ENLIVE / ENSURE PLUS) LIQD Take 237 mLs by mouth 2 (two) times daily between meals. (Patient taking differently: Take 237 mLs by mouth 2 (two) times daily between meals. Lactose reduced)   FLUoxetine (PROZAC) 20 MG capsule Take 1 capsule (20 mg total) by mouth daily.   guaiFENesin (MUCINEX) 600 MG 12 hr tablet Take 1 tablet  (600 mg total) by mouth 2 (two) times daily.   guaiFENesin-dextromethorphan (ROBITUSSIN DM) 100-10 MG/5ML syrup Take 5 mLs by mouth every 4 (four) hours as needed for cough.   ipratropium-albuterol (DUONEB) 0.5-2.5 (3) MG/3ML SOLN Take 3 mLs by nebulization every 4 (four) hours as needed.   levothyroxine (SYNTHROID) 50 MCG tablet Take 1 tablet (50 mcg total) by mouth daily.   omeprazole (PRILOSEC) 40 MG capsule Take 1 capsule (40 mg total) by mouth 2 (two) times daily before a meal.   OXYGEN Inhale 2 L into the lungs as needed (dyspnea, chest pain).   Polyethylene Glycol 3350 (MIRALAX PO) Take 17 g by mouth daily.   pravastatin (PRAVACHOL) 40 MG tablet  Take 1 tablet (40 mg total) by mouth at bedtime.   predniSONE (DELTASONE) 10 MG tablet 4 tablet daily for 2 days 3 tablet daily for 2 days 2 tablets daily for 2 days 1 tablet daily for 2 days (Patient taking differently: 4 tablet daily for 2 days 3 tablet daily for 2 days 2 tablets daily for 2 days 1 tablet daily for 2 days)   psyllium (METAMUCIL) 58.6 % packet Take 1 packet by mouth daily as needed (constipation).   SYMBICORT 80-4.5 MCG/ACT inhaler INHALE 2 PUFFS INTO THE LUNGS TWICE DAILY (Patient taking differently: Inhale 2 puffs into the lungs 2 (two) times daily.)   traZODone (DESYREL) 50 MG tablet Take 1 tablet (50 mg total) by mouth at bedtime as needed.   No facility-administered encounter medications on file as of 08/08/2022.     -------------------------------------------------------------------- Advance Care Planning/Goals of Care:  Health care surrogate gave her permission to discuss.Our advance care planning conversation included a discussion about:    The value and importance of advance care planning and designating a [facility] POA Exploration of personal, cultural or spiritual beliefs that might influence medical decisions-believes that Hospice may mean eminent demise Exploration of goals of care in the event of a sudden injury or  illness-has DNR Identification of a healthcare agent -daughter [name] [phone] Review of an advance directive document-DNR (uploaded to [place]) . Decision not to resuscitate or to de-escalate disease focused treatments due to poor prognosis. CODE STATUS: DNR   Thank you for the opportunity to participate in the care of Ms. Mansouri.  The palliative care team will continue to follow. Please call our office at [phone] if we can be of additional assistance.   Marijo Conception, FNP-C  COVID-19 PATIENT SCREENING TOOL Asked and negative response unless otherwise noted:  Have you had symptoms of covid, tested positive or been in contact with someone with symptoms/positive test in the past 5-10 days? unknown

## 2022-08-09 DIAGNOSIS — H04123 Dry eye syndrome of bilateral lacrimal glands: Secondary | ICD-10-CM | POA: Insufficient documentation

## 2022-08-09 DIAGNOSIS — L299 Pruritus, unspecified: Secondary | ICD-10-CM | POA: Insufficient documentation

## 2022-08-09 DIAGNOSIS — Z7189 Other specified counseling: Secondary | ICD-10-CM | POA: Insufficient documentation

## 2022-08-14 ENCOUNTER — Telehealth: Payer: Self-pay | Admitting: Internal Medicine

## 2022-08-14 ENCOUNTER — Encounter: Payer: Self-pay | Admitting: Internal Medicine

## 2022-08-14 NOTE — Telephone Encounter (Signed)
Inbound call from patient daughter wanting to scheduled hospital f/u. Also requesting to speak with a nurse regarding MyChart message . Please advise.  Thank you

## 2022-08-14 NOTE — Telephone Encounter (Signed)
Please contact the patient's daughter and let her know she did the right thing canceling with the new GI group We know all of her history here Please get her an appointment to see me or Amy or Nevin Bloodgood or [employer]

## 2022-08-14 NOTE — Telephone Encounter (Signed)
Pt scheduled to see Ellouise Newer PA 08/17/22 at 1:30pm. Pts daugher notified via mychart. She was notified Dr. Hilarie Fredrickson will address the mychart messages as soon as he can. Seeing pts this am and procedures all day yesterday.

## 2022-08-15 ENCOUNTER — Encounter: Payer: Self-pay | Admitting: Primary Care

## 2022-08-15 ENCOUNTER — Non-Acute Institutional Stay: Payer: Medicare Other | Admitting: Family Medicine

## 2022-08-15 ENCOUNTER — Encounter: Payer: Self-pay | Admitting: Family Medicine

## 2022-08-15 ENCOUNTER — Ambulatory Visit (INDEPENDENT_AMBULATORY_CARE_PROVIDER_SITE_OTHER): Payer: Medicare Other | Admitting: Primary Care

## 2022-08-15 VITALS — BP 120/60 | HR 85 | Ht 66.0 in | Wt 129.0 lb

## 2022-08-15 DIAGNOSIS — Z599 Problem related to housing and economic circumstances, unspecified: Secondary | ICD-10-CM

## 2022-08-15 DIAGNOSIS — E440 Moderate protein-calorie malnutrition: Secondary | ICD-10-CM | POA: Diagnosis not present

## 2022-08-15 DIAGNOSIS — J479 Bronchiectasis, uncomplicated: Secondary | ICD-10-CM

## 2022-08-15 DIAGNOSIS — J849 Interstitial pulmonary disease, unspecified: Secondary | ICD-10-CM

## 2022-08-15 DIAGNOSIS — J9601 Acute respiratory failure with hypoxia: Secondary | ICD-10-CM | POA: Diagnosis not present

## 2022-08-15 DIAGNOSIS — F411 Generalized anxiety disorder: Secondary | ICD-10-CM | POA: Diagnosis not present

## 2022-08-15 DIAGNOSIS — Z515 Encounter for palliative care: Secondary | ICD-10-CM | POA: Diagnosis not present

## 2022-08-15 DIAGNOSIS — R5383 Other fatigue: Secondary | ICD-10-CM | POA: Diagnosis not present

## 2022-08-15 DIAGNOSIS — R09A2 Foreign body sensation, throat: Secondary | ICD-10-CM

## 2022-08-15 DIAGNOSIS — F5101 Primary insomnia: Secondary | ICD-10-CM | POA: Diagnosis not present

## 2022-08-15 DIAGNOSIS — R5381 Other malaise: Secondary | ICD-10-CM | POA: Diagnosis not present

## 2022-08-15 DIAGNOSIS — Z9181 History of falling: Secondary | ICD-10-CM | POA: Insufficient documentation

## 2022-08-15 DIAGNOSIS — J9611 Chronic respiratory failure with hypoxia: Secondary | ICD-10-CM | POA: Insufficient documentation

## 2022-08-15 DIAGNOSIS — Z754 Unavailability and inaccessibility of other helping agencies: Secondary | ICD-10-CM | POA: Insufficient documentation

## 2022-08-15 DIAGNOSIS — F40240 Claustrophobia: Secondary | ICD-10-CM | POA: Diagnosis not present

## 2022-08-15 DIAGNOSIS — Z9981 Dependence on supplemental oxygen: Secondary | ICD-10-CM | POA: Diagnosis not present

## 2022-08-15 NOTE — Assessment & Plan Note (Addendum)
-   Continue to wear 3L oxygen 24/7 to maintain O2 >88-90%

## 2022-08-15 NOTE — Patient Instructions (Addendum)
Recommendations: - Resume Symbicort after you are discharged home- take two puffs morning and evening (rinse mouth after use) - Start prednisone 27m daily and stay on this until follow-up in March with Dr. RChase Caller- Continue to wear 3L oxygen 24/7 - Use ipratropium-albuterol nebulizer every 6 hours as needed for shortness of breath/wheezing   Orders: - Nebulizer machine (ordered) - HRCT re: ILD (ordered)  Rx: - Prednisone as directed  - Ipratropium-Albuterol nebulizer   Follow-up: - March with Dr. RChristie Beckers

## 2022-08-15 NOTE — Progress Notes (Signed)
[facility] Palliative Care Consult Note Telephone: [phone]  Fax: [phone]   Date of encounter: 08/15/22 9:18 PM PATIENT NAME: Stephanie Cortez [address] [place] [address]   [phone] (home) [phone] (work) DOB: 11/03/1933 MRN: [id number] PRIMARY CARE PROVIDER:    Marin Olp, MD,  [place] [address] [phone]  REFERRING PROVIDER:   Marin Cortez, [place] [address] [place],  [place] [address] [phone]  RESPONSIBLE PARTY:    Contact Information     Name Relation Home Work Mobile   Stephanie Cortez Daughter [phone]  [phone]   Stephanie Cortez [phone]          I met face to face with patient in [place] and [place]. Palliative Care was asked to follow this patient by consultation request of  Stephanie Olp, MD/Dr Stephanie Cortez, facility MD for [facility] to address advance care planning and complex medical decision making. This is a follow up visit.          ASSESSMENT, SYMPTOM MANAGEMENT AND PLAN / RECOMMENDATIONS:  Chronic hypoxic respiratory failure on home O2/ ILD Recently developed O2 dependence following Covid 19 infection with bronchiectasis exacerbation and subsequent RSV infection. Continue O2 @3$ -4 L/[place]. Continue follow up with Pulmonologist-Dr Stephanie Cortez, has appt with Pulmonology PA today. Pt tachypneic at baseline but worse with increased anxiety as pt noted to hyperventilate with shallow breaths.  Palliative Care Encounter/Inadequate Community Resources Has demonstrated progression of her ILD and development of chronic respiratory failure with O2 dependence, tachypnea at rest and DOE. Believe pt is close to Hospice level of care but not quite ready yet although this could change rapidly if develops another infection or has worsening of swallow/aspiration.  Discussed and added to resources provided by SNF for private pay 24/7  agencies.  Added [facility] and gave contact number. Encouraged daughter to check into pt's spouse's [employer] benefits to see if she was eligible for some in home help. If PCP in agreement will have Palliative SW follow up at home to help with identification of community resources/possible emotional support of caregiver. Palliative NP to reach out to community PCP Dr Stephanie Cortez for order to continue home Palliative Care support at d/c.  Globus sensation Globus sensation provoking cough intermittently with intact swallow per MBS but has diagnosed cricopharyngeal achalasia which may be source of globus sensation.  Educated that GI may or may not recommend esophageal dilatation based off their assessment of source of problem. Follow up with GI assessment pending on Friday 08/17/22. Has hx of mesenteric ischemia. Recent portable KUB negative for obstruction/ileus, noted constipation. Tolerating diet currently without vomiting Avoid use of antidiarrheals with loose stools as this is likely overflow from constipation.  Moderate protein calorie malnutrition Lost 20 lbs 7.1 oz from 04/16/22 to 07/26/22. Continue lactose reduced ensure BID.  5.  Debility/at risk for falls Lacks some self awareness of deficits that increase risk for fall. Agree with SNF recommendations of 24/7 supervision, home health PT/OT and RN, hospital bed for ease of transfer to and from bed with decrease risk for fall, elevation of head for improved breathing/reduction of aspiration risk and ability to elevate feet for heart failure.  6. Caregiver with fatigue Daughter and her husband are PCG for both pt and her spouse in separate residences. Spouse is currently in SNF and likely to go to his home in about 1 month. Pt's son lives in [place], is supportive but not able  to help with daily care of both parents who are estranged from each other. Pt now requiring 24/7 supervision for safety. Daughter is overwhelmed.  Encouraged to make some time for herself to eat/rest and have time away from demands.  Encouraged to only tackle 1 piece at a time to avoid being overwhelmed by the entirety of needs and seek counseling support if needed. Encouraged to delegate tasks to her brother that he can complete long distance by doing investigations (for instance into [place] benefits). Encouraged if pt becomes unrealistic in expectations to have her make a choice that is within her ability to do.  For instance, daughter considering changing pt's home into a functional duplex to put her mother and father under 1 roof but not together to allow her to care for both and not strain their resources to be able to do so. Advised daughter to present it to pt as a choice of having them both under the same roof and able to have her be there for both/pool resources or for pt to choose to do long term care.  D/C Plan: DME at home:  3:1 commode, wheelchair and rollator DME to be ordered through [facility]:  electric hospital bed and O2 concentrator/supplies Home therapies:  PT, OT and nursing to  be set up by SNF with [facility] to obtain new referral from community PCP to continue to follow at home and provide transition to Hospice level of care with [facility] when appropriate.    Follow up Palliative Care Visit: Palliative care will continue to follow for complex medical decision making, advance care planning, and clarification of goals. Return 4 weeks or prn.    This visit was coded based on medical decision making (MDM).  PPS: 40%  HOSPICE ELIGIBILITY/DIAGNOSIS: TBD  Chief Complaint: Palliative Care is following patient for chronic disease management in setting of chronic hypoxemic respiratory failure due to ILD.  Palliative Care is also following to assist with defining/refining goals of care.     HISTORY OF PRESENT ILLNESS:  Stephanie Cortez is a 89 y.o. year old female with  acute exacerbation of chronic ILD due to RSV infection with chronic hypoxemic respiratory failure.   Permanent atrial fibrillation, orthostatic lightheadedness, coronary artery calcification on CT, Heart failure with preserved EF, bronchiectasis, chronic sinusitis, cricopharyngeal achalasia, GERD, sensorineural hearing loss. Met with pt, daughter [name], facility personnel:  SW, PT, OT and ST.   She has hx of long term estrangement from spouse but remains married and living in separate residences with pt in the family home.  She denies nausea, vomiting for at least the last week to 2 weeks, appetite and intake have improved. Continues to have globus sensation in her throat.  Denies falls.  Today was a multidisciplinary discharge planning meeting to address needs to transition pt safely to home.  Updates received from all disciplines on progress thus far.  PT indicated that pt was able to ambulate with SBA and rollator walker for 100 feet before having to stop to rest and catch her breath.  OT concurs that pt has improved and is able to do most of her own bathing and dressing seated but requires assist with getting in and out of the shower for safety and fall prevention.  ST indicates memory assessment showing mild short term memory deficits, had discussed safe swallow strategies and agreed that overall swallow has improved with less symptoms of dysphagia.  Continues to have globus sensation but no  recent vomiting and improved intake.    History obtained from review of EMR, discussion with family, facility staff and/or Ms. Nisley.     I reviewed EMR for available labs, medications, imaging, studies and related documents.  No new records since last visit.   ROS General: NAD ENMT: endorses lessening of globus sensation, no noted coughing/choking on current diet Cardiovascular: Denies CP, endorses DOE Pulmonary: denies productive cough, endorses baseline SOB Abdomen: denies vomiting. Endorses constipation  helped by Miralax with decrease in epigastric pain/pressure. Continent of bowel. GU: denies dysuria, endorses continence of urine MSK:  endorses improved tolerance of activity with PT, Ambulates with rollator with PT/otherwise using wc for mobility, no falls reported Skin: denies rashes or wounds Neurological: endorses chronic pain in right hip and leg, denies insomnia Psych: improved mood with pending d/c home Heme/lymph/immuno: denies bruises and abnormal bleeding  Physical Exam: Current and past weights: 127 lbs 10.3 oz as of 07/26/22, weight on 04/16/22 147 lbs 3.2 oz Constitutional: NAD General: frail appearing, thin but less fragile than 2 weeks ago CV: S1S2, IRIR, no LE edema Pulmonary:  left posterior lung with exhaling rhonchi and rales in left base. Right LL with coarse rales, upper and middle lobe clear. Tachypneic with rate 30, no cough, O2 @ 3-4L [place] Abdomen: normo-active BS + 4 quadrants, soft and non tender GU: deferred MSK: no sarcopenia, moves all extremities Skin: warm and dry except for cool hands & wearing gloves, no rashes or wounds on visible skin Neuro:  no generalized weakness, some need for repetition with mild short term memory loss Psych: mildly anxious affect, A and O x 3, some short term memory loss Hem/lymph/immuno: no widespread bruising    Preferred Pharmacy: [facility]:  Allergies  Allergen Reactions   Doxycycline Other (See Comments)    Rash on face and neck   Tramadol     Constipation, abdominal obstruction leading to weakness after using this   Vicodin [Hydrocodone-Acetaminophen] Other (See Comments)    Hallucinations    Flagyl [Metronidazole] Nausea And Vomiting   Moxifloxacin Other (See Comments)     Weakness/fatigue   Pantoprazole Sodium Rash   Penicillins Rash    Has patient had a PCN reaction causing immediate rash, facial/tongue/throat swelling, SOB or lightheadedness with hypotension:unsure Has patient had a PCN reaction causing severe  rash involving mucus membranes or skin necrosis:unsure Has patient had a PCN reaction that required hospitalization:No Has patient had a PCN reaction occurring within the last 10 years:Yes Cannot recall exact reaction due to time lapse If all of the above answers are "NO", then may proceed with Cephalosporin use.  Other reaction(s): Unknown     PERTINENT MEDICATIONS:  Outpatient Encounter Medications as of 08/15/2022  Medication Sig   acetaminophen (TYLENOL) 325 MG tablet Take 2 tablets (650 mg total) by mouth every 6 (six) hours as needed for mild pain (or Fever >/= 101).   apixaban (ELIQUIS) 2.5 MG TABS tablet Take 1 tablet (2.5 mg total) by mouth 2 (two) times daily. Needs cardiology appt for refills, call office   feeding supplement (ENSURE ENLIVE / ENSURE PLUS) LIQD Take 237 mLs by mouth 2 (two) times daily between meals. (Patient taking differently: Take 237 mLs by mouth 2 (two) times daily between meals. Lactose reduced)   FLUoxetine (PROZAC) 20 MG capsule Take 1 capsule (20 mg total) by mouth daily.   guaiFENesin (MUCINEX) 600 MG 12 hr tablet Take 1 tablet (600 mg total) by mouth 2 (two) times daily.   ipratropium-albuterol (  DUONEB) 0.5-2.5 (3) MG/3ML SOLN Take 3 mLs by nebulization every 4 (four) hours as needed.   levothyroxine (SYNTHROID) 50 MCG tablet Take 1 tablet (50 mcg total) by mouth daily.   omeprazole (PRILOSEC) 40 MG capsule Take 1 capsule (40 mg total) by mouth 2 (two) times daily before a meal.   OXYGEN Inhale 2 L into the lungs as needed (dyspnea, chest pain).   Polyethylene Glycol 3350 (MIRALAX PO) Take 17 g by mouth daily.   pravastatin (PRAVACHOL) 40 MG tablet Take 1 tablet (40 mg total) by mouth at bedtime.   psyllium (METAMUCIL) 58.6 % packet Take 1 packet by mouth daily as needed (constipation).   SYMBICORT 80-4.5 MCG/ACT inhaler INHALE 2 PUFFS INTO THE LUNGS TWICE DAILY (Patient taking differently: Inhale 2 puffs into the lungs 2 (two) times daily.)   traZODone  (DESYREL) 50 MG tablet Take 1 tablet (50 mg total) by mouth at bedtime as needed.   [DISCONTINUED] predniSONE (DELTASONE) 10 MG tablet 4 tablet daily for 2 days 3 tablet daily for 2 days 2 tablets daily for 2 days 1 tablet daily for 2 days (Patient not taking: Reported on 08/15/2022)   No facility-administered encounter medications on file as of 08/15/2022.     -------------------------------------------------------- Advance Care Planning/Goals of Care:           Continues to look for notary and             Witnesses to complete [facility] POA Identification of a healthcare agent -daughter [name] [phone] followed by son Safire Bolash Decision not to resuscitate or to de-escalate disease focused treatments due to poor prognosis. CODE STATUS: DNR MOST as of 08/08/22: DNR/DNI with comfort measures Use of antibiotics and IV fluids if indicated No feeding tube.    Thank you for the opportunity to participate in the care of Ms. Whaling.  The palliative care team will continue to follow. Please call our office at [phone] if we can be of additional assistance.   Marijo Conception, FNP-C  COVID-19 PATIENT SCREENING TOOL Asked and negative response unless otherwise noted:  Have you had symptoms of covid, tested positive or been in contact with someone with symptoms/positive test in the past 5-10 days? unknown

## 2022-08-15 NOTE — Assessment & Plan Note (Addendum)
-   ILD has progressed. Now requiring oxygen. She has a poor prognosis d/t age and frailty. Recently hospitalized for acute exacerbation- discharged on prednisone taper. She is better since discharge but continues to have wheezing/rales on exam. Recommend patient stay on low dose prednisone '10mg'$  daily as benefits outweigh risk. Discussed these with patient today. Continue Symbicort '80mg'$  two puffs twice daily. Sending in DME order for patient to be provided nebulizer machine. Recommend she use ipratropium-albuterol every 6 hours as needed for breakthrough shortness of breath/wheezing. She will continue to follow with palliative. We will get updated HRCT imaging in 4 weeks and she will have a follow up with Dr. Letha Cape.

## 2022-08-15 NOTE — Progress Notes (Signed)
$@PatientW$  ID: Stephanie Cortez, female    DOB: [birth year], 89 y.o.   MRN: [id number]  Chief Complaint  Patient presents with   Hospitalization Follow-up    RSV    Referring provider: Marin Olp, MD  HPI: 89 year old female, never smoked. PMH significant for ILD, NSIP, bronchiectasis, cricopharyngeal achalasia, afib, heart failure, HTN, CAD, GERD, hypothyroid, protein calorie malnutrition, palliative care encounter, PEG tube placement.  Patient Dr. Chase Caller, last seen on 03/15/22.    Previous [facility] pulmonary encounter: 03/15/2022 -   Chief Complaint  Patient presents with   Follow-up    Pt states she has been coughing a lot since last visit and states she will cough up phlegm that is yellow in color. Pt also has had some problems with her breathing. Also has had some wheezing.   Stephanie Cortez 89 y.o. -returns for follow-up with her daughter [name].  Her grandson Ovid Curd is going to get married in October 2024.  She is pretty excited about that.  Other than that this visit is to review progression in her ILD.  Her overnight pulse oximetry did not show any desaturations.  She continues to be on room air at night.  She had high-resolution CT chest that compared to the previous 1 shows continued progression consistent with hypersensitive pneumonitis.  This only mild progression.  For this have recommended no antifibrotic's and no need for night O2 but just supportive care.  During this visit they report a lot of choking episodes happened 6 days ago with water and thyroid medication and 3 days ago with solid.  She is known to have cricopharyngeal achalasia and a remote history of Botox injection possibly in [place].  She recently saw Dr. Hilarie Fredrickson but that was before these choking episodes.  In the hospital last year she did have a speech study and did have dysphagia.  She is also reporting clearing of the throat for the last 2 or 3 years worse in the last 6 to 12 months.  She brought up some  white phlegm.  She says also occasionally it is yellow in color but really there is no change in the phlegm or the sputum quantity or quality nevertheless they are open to the idea of trying prednisone.  Review of the records indicate that she did see ENT in 2019 Dr. Wilburn Cornelia but it was for hearing issues.  They are really worried about the clearing of throat and choking episodes and dysphagia.  She is interested in the ILD-Pro registry study.  She did the consent last time and is interested in participating if she qualifies.   She continues with the Symbicort. CT Chest data  No results found.  Narrative & Impression  CLINICAL DATA:  89 year old female with history of wheezing and shortness of breath. Evaluate for interstitial lung disease.   EXAM: CT CHEST WITHOUT CONTRAST   TECHNIQUE: Multidetector CT imaging of the chest was performed following the standard protocol without intravenous contrast. High resolution imaging of the lungs, as well as inspiratory and expiratory imaging, was performed.   RADIATION DOSE REDUCTION: This exam was performed according to the departmental dose-optimization program which includes automated exposure control, adjustment of the mA and/or kV according to patient size and/or use of iterative reconstruction technique.   COMPARISON:  High-resolution chest CT 11/24/2020.   FINDINGS: Cardiovascular: Heart size is enlarged with biatrial dilatation (right-greater-than-left). There is no significant pericardial fluid, thickening or pericardial calcification. There is aortic atherosclerosis, as well as  atherosclerosis of the great vessels of the mediastinum and the coronary arteries, including calcified atherosclerotic plaque in the left main, left circumflex and right coronary arteries. Dilatation of the pulmonic trunk (3.5 cm in diameter).   Mediastinum/Nodes: Multiple prominent borderline enlarged mediastinal and bilateral hilar lymph nodes,  similar to the prior study. Esophagus is unremarkable in appearance. No axillary lymphadenopathy.   Lungs/Pleura: High-resolution images again demonstrate widespread but patchy areas of ground-glass attenuation, septal thickening, scattered subpleural reticulation, extensive thickening of the peribronchovascular interstitium and some scattered areas of mild cylindrical bronchiectasis and peripheral bronchiolectasis. No frank honeycombing. These findings do appear mildly progressive compared to the prior study. There is no definitive craniocaudal gradient, with the majority of the most severe fibrotic findings in the mid to upper lungs. Inspiratory and expiratory imaging demonstrates moderate air trapping indicative of small airways disease. No acute consolidative airspace disease. No pleural effusions. No definite suspicious appearing pulmonary nodules or masses are noted.   Upper Abdomen: Calcified granulomas in the spleen. Aortic atherosclerosis.   Musculoskeletal: There are no aggressive appearing lytic or blastic lesions noted in the visualized portions of the skeleton.   IMPRESSION: 1. Chronic interstitial lung disease slightly progressive compared to the prior examination, most compatible with an alternative diagnosis (not usual interstitial pneumonia), once again favored to represent chronic hypersensitivity pneumonitis. 2. Dilatation of the pulmonic trunk (3.5 cm in diameter), concerning for pulmonary arterial hypertension. 3. Aortic atherosclerosis, in addition to left main and 2 vessel coronary artery disease.   Aortic Atherosclerosis (ICD10-I70.0).     Electronically Signed   By: Vinnie Langton M.D.   On: 12/14/2021 08:20        OV 07/20/2022  Subjective:  Patient ID: Stephanie Cortez, female , DOB: 07-Jul-1933 , age 30 y.o. , MRN: [id number] , ADDRESS: [place] [id number] PCP Marin Olp, MD Patient Care Team: Marin Olp,  MD as PCP - General (Family Medicine) Deboraha Sprang, MD as PCP - Cardiology (Cardiology) Deboraha Sprang, MD (Cardiology) Sable Feil, MD (Gastroenterology) Brand Males, MD (Pulmonary Disease) Renato Shin, MD (Inactive) (Endocrinology) Erline Levine, MD (Neurosurgery) Weber Cooks, MD (Inactive) (Orthopedic Surgery) Rozetta Nunnery, MD (Inactive) (Otolaryngology)  This Provider for this visit: Treatment Team:  Attending Provider: Brand Males, MD  Follow-up interstitial lung disease type/clinical hypersensitive pneumonitis progressive phenotype  -ATS 2022 progressive pulmonary fibrosis [PPF] MDD discussion 08/11/2019: Very concerning for hypersensitive pneumonitis.     08/15/2022- Interim hx  Patient presents today for hospital follow-up  Patient was hospitalized from 07/20/22-07/26/22 for  acute hypoxemic respiratory failure secondary to acute exacerbation of chronic interstitial lung disease and RSV infection.  Treated aggressively with bronchodilators, IV steroids wean to oral steroids.  Recommended gradual taper for total 2 weeks.  Cultures were negative.  Antibiotics were discontinued. ILD with poor prognosis. Seen by palliative care, patient is DNR. She is open to hospice. Plan continue inhaled steroids, as needed bronchodilators, deep breathing exercise, incentive spirometry, chest physical therapy and mucolytic's. Discharged to SNF on prednisone taper.   She is currently at [facility], planning on being discharged home Saturday. Following with palliative care. She will be getting hospital bed delivered. She is feeling a good deal better since being in the hospital. She completed prednisone. She has had some wheezing which is not new. Using duoneb twice a day. She is on 3L oxygen.    Allergies  Allergen Reactions   Doxycycline Other (See Comments)    Rash on face  and neck   Tramadol     Constipation, abdominal obstruction leading to weakness after using this    Vicodin [Hydrocodone-Acetaminophen] Other (See Comments)    Hallucinations    Flagyl [Metronidazole] Nausea And Vomiting   Moxifloxacin Other (See Comments)     Weakness/fatigue   Pantoprazole Sodium Rash   Penicillins Rash    Has patient had a PCN reaction causing immediate rash, facial/tongue/throat swelling, SOB or lightheadedness with hypotension:unsure Has patient had a PCN reaction causing severe rash involving mucus membranes or skin necrosis:unsure Has patient had a PCN reaction that required hospitalization:No Has patient had a PCN reaction occurring within the last 10 years:Yes Cannot recall exact reaction due to time lapse If all of the above answers are "NO", then may proceed with Cephalosporin use.  Other reaction(s): Unknown    Immunization History  Administered Date(s) Administered   Influenza Split 04/06/2011, 04/03/2012   Influenza Whole 06/01/2009, 04/03/2010   Influenza, High Dose Seasonal PF 04/04/2017, 03/06/2018, 03/03/2019   Influenza,inj,Quad PF,6+ Mos 03/10/2013, 03/24/2015   Influenza-Unspecified 04/01/2014   Pneumococcal Conjugate-13 07/28/2014   Pneumococcal Polysaccharide-23 07/04/2006, 06/13/2016   Tdap 07/28/2014    Past Medical History:  Diagnosis Date   Acute bronchitis 02/20/2016   Anemia    - Hgb 9.7gm% on 07/13/2008 in [place] -  Hgg 129gm% wiht normal irone levsl and ferritin 10/27/2008 in [place] Recurrent otitis/sinusitis   Anxiety    chronic BZ prn   Atrial fibrillation (HCC)    chronic anticoag   Bronchiectasis    >PFT 07/13/2008 in [place] 1.9L/76%, FVC 2.45L/74, Ratio 79, TLC 121%, DLCO 64%  AE BRonchiectasis - Dec 2010.New Rx:  outpatient - Feb 2011 - Rx outpatient   CHF (congestive heart failure) ([place])    Closed right hip fracture, initial encounter ([place]) 10/27/2019   COPD (chronic obstructive pulmonary disease) ([place])    bronchiectasis   COVID-19 virus infection 06/17/2021   Cricopharyngeal achalasia    Depression     Diverticulitis 01/01/2012   Diverticulosis    Dyslipidemia    Eczema    Encounter for preoperative pulmonary examination 01/04/2016   Fatty liver    GERD with stricture    Glaucoma    H. pylori infection    Hypertension    Hyponatremia    chronic, s/p endo eval 06/2012   Hypothyroid    IBS (irritable bowel syndrome)    Lumbar disc disease    Mesenteric ischemia (HCC)    Neuropathy of both feet    Segmental colitis ([place])    Small bowel ischemia ([place]) 02/08/2020   Ventral hernia    Vertigo    Wears glasses     Tobacco History: Social History   Tobacco Use  Smoking Status Never  Smokeless Tobacco Never   Counseling given: Not Answered   Outpatient Medications Prior to Visit  Medication Sig Dispense Refill   guaiFENesin (MUCINEX) 600 MG 12 hr tablet Take 1 tablet (600 mg total) by mouth 2 (two) times daily.     ipratropium-albuterol (DUONEB) 0.5-2.5 (3) MG/3ML SOLN Take 3 mLs by nebulization every 4 (four) hours as needed. 360 mL    acetaminophen (TYLENOL) 325 MG tablet Take 2 tablets (650 mg total) by mouth every 6 (six) hours as needed for mild pain (or Fever >/= 101).     apixaban (ELIQUIS) 2.5 MG TABS tablet Take 1 tablet (2.5 mg total) by mouth 2 (two) times daily. Needs cardiology appt for refills, call office 60 tablet 1  feeding supplement (ENSURE ENLIVE / ENSURE PLUS) LIQD Take 237 mLs by mouth 2 (two) times daily between meals. (Patient taking differently: Take 237 mLs by mouth 2 (two) times daily between meals. Lactose reduced) 237 mL 12   FLUoxetine (PROZAC) 20 MG capsule Take 1 capsule (20 mg total) by mouth daily. 90 capsule 3   levothyroxine (SYNTHROID) 50 MCG tablet Take 1 tablet (50 mcg total) by mouth daily. 90 tablet 3   omeprazole (PRILOSEC) 40 MG capsule Take 1 capsule (40 mg total) by mouth 2 (two) times daily before a meal. 90 capsule 2   OXYGEN Inhale 2 L into the lungs as needed (dyspnea, chest pain).     Polyethylene Glycol 3350 (MIRALAX PO) Take 17  g by mouth daily.     pravastatin (PRAVACHOL) 40 MG tablet Take 1 tablet (40 mg total) by mouth at bedtime. 90 tablet 3   predniSONE (DELTASONE) 10 MG tablet 4 tablet daily for 2 days 3 tablet daily for 2 days 2 tablets daily for 2 days 1 tablet daily for 2 days (Patient not taking: Reported on 08/15/2022) 20 tablet 0   psyllium (METAMUCIL) 58.6 % packet Take 1 packet by mouth daily as needed (constipation).     SYMBICORT 80-4.5 MCG/ACT inhaler INHALE 2 PUFFS INTO THE LUNGS TWICE DAILY (Patient taking differently: Inhale 2 puffs into the lungs 2 (two) times daily.) 10.2 g 3   traZODone (DESYREL) 50 MG tablet Take 1 tablet (50 mg total) by mouth at bedtime as needed. 90 tablet 3   guaiFENesin-dextromethorphan (ROBITUSSIN DM) 100-10 MG/5ML syrup Take 5 mLs by mouth every 4 (four) hours as needed for cough. 118 mL 0   No facility-administered medications prior to visit.   Review of Systems  Review of Systems  Constitutional: Negative.   HENT: Negative.    Respiratory:  Positive for wheezing.     Physical Exam  BP 120/60 (BP Location: Right Arm)   Pulse 85   Ht 5' 6"$  (1.676 m)   Wt 129 lb (58.5 kg)   LMP  (LMP Unknown)   SpO2 100% Comment: On 3L of oxygen  BMI 20.82 kg/m  Physical Exam Constitutional:      General: She is not in acute distress.    Appearance: Normal appearance. She is not ill-appearing.  HENT:     Head: Normocephalic and atraumatic.  Cardiovascular:     Rate and Rhythm: Normal rate and regular rhythm.  Pulmonary:     Effort: Pulmonary effort is normal.     Breath sounds: Wheezing and rales present. No rhonchi.  Musculoskeletal:     Comments: In WC  Skin:    General: Skin is warm and dry.  Neurological:     General: No focal deficit present.     Mental Status: She is alert and oriented to person, place, and time. Mental status is at baseline.  Psychiatric:        Mood and Affect: Mood normal.        Behavior: Behavior normal.        Thought Content: Thought  content normal.        Judgment: Judgment normal.      Lab Results:  CBC    Component Value Date/Time   WBC 7.6 07/24/2022 0215   RBC 3.85 (L) 07/24/2022 0215   HGB 10.9 (L) 07/24/2022 0215   HGB 11.3 02/16/2019 1452   HCT 34.3 (L) 07/24/2022 0215   HCT 33.9 (L) 02/16/2019 1452   PLT 361  07/24/2022 0215   PLT 256 02/16/2019 1452   MCV 89.1 07/24/2022 0215   MCV 92 02/16/2019 1452   MCH 28.3 07/24/2022 0215   MCHC 31.8 07/24/2022 0215   RDW 14.8 07/24/2022 0215   RDW 12.1 02/16/2019 1452   LYMPHSABS 1.0 07/24/2022 0215   MONOABS 0.6 07/24/2022 0215   EOSABS 0.0 07/24/2022 0215   BASOSABS 0.0 07/24/2022 0215    BMET    Component Value Date/Time   NA 134 (L) 07/24/2022 0215   NA 130 (L) 02/16/2019 1452   K 3.9 07/24/2022 0215   CL 98 07/24/2022 0215   CO2 30 07/24/2022 0215   GLUCOSE 106 (H) 07/24/2022 0215   BUN 17 07/24/2022 0215   BUN 13 02/16/2019 1452   CREATININE 0.64 07/24/2022 0215   CREATININE 0.54 (L) 07/06/2022 1610   CALCIUM 8.7 (L) 07/24/2022 0215   GFRNONAA >60 07/24/2022 0215   GFRAA >60 02/29/2020 0449    BNP    Component Value Date/Time   BNP 155.6 (H) 07/20/2022 1430    ProBNP No results found for: "PROBNP"  Imaging: DG Swallowing Func-Speech Pathology  Result Date: 07/23/2022 Table formatting from the original result was not included. Objective Swallowing Evaluation: Type of Study: MBS-Modified Barium Swallow Study  Patient Details Name: KATELAN KIPPER MRN: [id number] Date of Birth: May 12, 1934 Today's Date: 07/23/2022 Time: SLP Start Time (ACUTE ONLY): 1310 -SLP Stop Time (ACUTE ONLY): 1340 SLP Time Calculation (min) (ACUTE ONLY): 30 min Past Medical History: Past Medical History: Diagnosis Date  Anemia   - Hgb 9.7gm% on 07/13/2008 in [place] -  Hgg 129gm% wiht normal irone levsl and ferritin 10/27/2008 in [place] Recurrent otitis/sinusitis  Anxiety   chronic BZ prn  Atrial fibrillation (HCC)   chronic anticoag  Bronchiectasis   >PFT 07/13/2008 in  [place] 1.9L/76%, FVC 2.45L/74, Ratio 79, TLC 121%, DLCO 64%  AE BRonchiectasis - Dec 2010.New Rx:  outpatient - Feb 2011 - Rx outpatient  CHF (congestive heart failure) ([place])   COPD (chronic obstructive pulmonary disease) (HCC)   bronchiectasis  Cricopharyngeal achalasia   Depression   Diverticulosis   Dyslipidemia   Eczema   Fatty liver   GERD with stricture   Glaucoma   H. pylori infection   Hypertension   Hyponatremia   chronic, s/p endo eval 06/2012  Hypothyroid   IBS (irritable bowel syndrome)   Lumbar disc disease   Neuropathy of both feet   Segmental colitis ([place])   Ventral hernia   Vertigo   Wears glasses  Past Surgical History: Past Surgical History: Procedure Laterality Date  BOWEL RESECTION N/A 02/08/2020  Procedure: SMALL BOWEL RESECTION;  Surgeon: Donnie Mesa, MD;  Location: [facility];  Service: General;  Laterality: N/A;  BREAST SURGERY    br bx  CARDIAC CATHETERIZATION  07/01/2013  CATARACT EXTRACTION    both  COLONOSCOPY    ESOPHAGOSCOPY W/ BOTOX INJECTION  12/11/2011  Procedure: ESOPHAGOSCOPY WITH BOTOX INJECTION;  Surgeon: Rozetta Nunnery, MD;  Location: [place];  Service: ENT;  Laterality: N/A;  esophogoscopy with dilation, botox injection  FOOT SURGERY  03/14/2011  gastroc slide-rt  GASTROSTOMY N/A 02/08/2020  Procedure: INSERTION OF GASTROSTOMY TUBE;  Surgeon: Donnie Mesa, MD;  Location: [place];  Service: General;  Laterality: N/A;  HIP ARTHROPLASTY Right 10/28/2019  Procedure: ARTHROPLASTY BIPOLAR HIP (HEMIARTHROPLASTY);  Surgeon: Marybelle Killings, MD;  Location: [facility];  Service: Orthopedics;  Laterality: Right;  LAPAROTOMY N/A 02/08/2020  Procedure: EXPLORATORY LAPAROTOMY;  Surgeon:  Donnie Mesa, MD;  Location: [facility];  Service: General;  Laterality: N/A;  LYSIS OF ADHESION N/A 02/08/2020  Procedure: LYSIS OF ADHESIONS;  Surgeon: Donnie Mesa, MD;  Location: [place];  Service: General;  Laterality: N/A;  TOTAL ABDOMINAL HYSTERECTOMY    TOTAL HIP REVISION Right 11/16/2019   Procedure: TOTAL HIP REVISION BIPOLAR TO CEMENTED BIPOLAR;  Surgeon: Marybelle Killings, MD;  Location: [place];  Service: Orthopedics;  Laterality: Right;  WISDOM TOOTH EXTRACTION   HPI: 89 y.o. female  with past medical history of longstanding cricopharyngeal achalasia (successful treatment previously with Botulinin toxin), ILD, permanent A-fib (Eliquis), HFpEF, HTN, hypothyroidism admitted on 07/20/2022 with weakness.  Patient was scheduled for follow-up with up with Dr. Chase Caller on 1/19.  However, that morning patient was too weak for outpatient visit and was subsequently transferred to [facility] ED by EMS. On admission, patient tested positive for RSV infection.  She is being treated with a steroid taper, Brovana/Pulmicort, DuoNebs, guaifenesin, frequent flutter valve usage, and IV Lasix.  No data recorded  Recommendations for follow up therapy are one component of a multi-disciplinary discharge planning process, led by the attending physician.  Recommendations may be updated based on patient status, additional functional criteria and insurance authorization. Assessment / Plan / Recommendation   07/23/2022   1:00 PM Clinical Impressions Clinical Impression Patient presents with a normal oropharyngeal swallow. Timely and complete oral manipulate/mastication of all consistencies provided and swift swallow initiation noted. Patient does have what appear to be osteophytes located at C4-6 as well as a CP bar which protrude into esophageal space but did not impede clearance of bolus or result in any post swallow residue during todays study despite patient being challenged with multiple bites of regular texture solids and barium pill. Full airway protection noted. She also did not complain of globus during today's study and although cannot r/o presence of post swallow residue with dry or sticky solids, suspect that previous complaints of globus are related to delayed/decreased clearance of esophagus below this level. Given  history and degree to which patient complaints are prohibiting po intake, continue to recommend GI f/u, with possible barium swallow to assess esophageal clearance. Educated patient and daughter (via phone). No SLP f/u indicated at this time. SLP Visit Diagnosis Dysphagia, unspecified (R13.10)     07/23/2022   1:00 PM Treatment Recommendations Treatment Recommendations No treatment recommended at this time     06/23/2021   2:29 PM Prognosis Prognosis for Safe Diet Advancement Good   07/23/2022   1:00 PM Diet Recommendations SLP Diet Recommendations Regular solids;Thin liquid Liquid Administration via Cup;Straw Medication Administration Whole meds with liquid Compensations Slow rate;Small sips/bites;Follow solids with liquid Postural Changes Remain semi-upright after after feeds/meals (Comment);Seated upright at 90 degrees     07/23/2022   1:00 PM Other Recommendations Recommended Consults Consider GI evaluation;Consider esophageal assessment Oral Care Recommendations Oral care BID Follow Up Recommendations No SLP follow up   06/23/2021   2:29 PM Frequency and Duration  Speech Therapy Frequency (ACUTE ONLY) min 1 x/week Treatment Duration 1 week     07/23/2022   1:00 PM Oral Phase Oral Phase [facility]    07/23/2022   1:00 PM Pharyngeal Phase Pharyngeal Phase [facility]    07/23/2022   1:00 PM Cervical Esophageal Phase  Cervical Esophageal Phase [facility] Leah McCoy MA, CCC-SLP McCoy Leah Meryl 07/23/2022, 2:09 PM                     DG  Chest Portable 1 View  Result Date: 07/20/2022 CLINICAL DATA:  Shortness of breath for a few weeks getting worse EXAM: PORTABLE CHEST 1 VIEW COMPARISON:  Portable exam 1535 hours compared to CT chest 12/12/2021 and chest radiograph 06/17/2021 FINDINGS: Enlargement of cardiac silhouette. Atherosclerotic calcification aorta. Severe diffuse interstitial infiltrates with upper lobe predominance, slightly increased from prior CT. No definite pleural effusion or pneumothorax. Bones demineralized with old  posttraumatic deformity proximal LEFT humerus. IMPRESSION: Severe chronic interstitial lung disease with upper lobe predominance, greater on LEFT, slightly increased from previous exam. Aortic Atherosclerosis (ICD10-I70.0). Electronically Signed   By: Lavonia Dana M.D.   On: 07/20/2022 15:52     Assessment & Plan:   ILD (interstitial lung disease) ([place]) - ILD has progressed. Now requiring oxygen. She has a poor prognosis d/t age and frailty. Recently hospitalized for acute exacerbation- discharged on prednisone taper. She is better since discharge but continues to have wheezing/rales on exam. Recommend patient stay on low dose prednisone 57m daily as benefits outweigh risk. Discussed these with patient today. Continue Symbicort 825mtwo puffs twice daily. Sending in DME order for patient to be provided nebulizer machine. Recommend she use ipratropium-albuterol every 6 hours as needed for breakthrough shortness of breath/wheezing. She will continue to follow with palliative. We will get updated HRCT imaging in 4 weeks and she will have a follow up with Dr. RaLetha Cape  Acute respiratory failure with hypoxia (HCC) - Continue to wear 3L oxygen 24/7 to maintain O2 >88-90%  [place]- Encourage mucinex and flutter valve to help with airway clearance    ElMartyn EhrichNP 08/15/2022

## 2022-08-15 NOTE — Assessment & Plan Note (Signed)
-   Encourage mucinex and flutter valve to help with airway clearance

## 2022-08-16 ENCOUNTER — Encounter: Payer: Self-pay | Admitting: Family Medicine

## 2022-08-17 ENCOUNTER — Encounter: Payer: Self-pay | Admitting: Physician Assistant

## 2022-08-17 ENCOUNTER — Telehealth: Payer: Self-pay

## 2022-08-17 ENCOUNTER — Ambulatory Visit (INDEPENDENT_AMBULATORY_CARE_PROVIDER_SITE_OTHER): Payer: Medicare Other | Admitting: Physician Assistant

## 2022-08-17 ENCOUNTER — Other Ambulatory Visit: Payer: Self-pay

## 2022-08-17 DIAGNOSIS — G8929 Other chronic pain: Secondary | ICD-10-CM

## 2022-08-17 DIAGNOSIS — R1013 Epigastric pain: Secondary | ICD-10-CM

## 2022-08-17 DIAGNOSIS — R1319 Other dysphagia: Secondary | ICD-10-CM | POA: Diagnosis not present

## 2022-08-17 DIAGNOSIS — J9610 Chronic respiratory failure, unspecified whether with hypoxia or hypercapnia: Secondary | ICD-10-CM

## 2022-08-17 DIAGNOSIS — K224 Dyskinesia of esophagus: Secondary | ICD-10-CM | POA: Diagnosis not present

## 2022-08-17 DIAGNOSIS — I5032 Chronic diastolic (congestive) heart failure: Secondary | ICD-10-CM

## 2022-08-17 DIAGNOSIS — J849 Interstitial pulmonary disease, unspecified: Secondary | ICD-10-CM

## 2022-08-17 NOTE — Telephone Encounter (Signed)
Palliative Care Telephone Encounter Note  1611pm - spoke to [name] to schedule a Palliative Care home visit for 08/28/22 at 1330pm. Encouraged [name] to call if pt returns to the hospital before scheduled visit. She voiced understanding and agreement.  Thank you,  Lorelee Market, [place] Nurse [phone]

## 2022-08-17 NOTE — Progress Notes (Signed)
Subjective:    Patient ID: Stephanie Cortez, female    DOB: 06-09-1934, 89 y.o.   MRN: [id number]  HPI [name] is a very pleasant 89 year old white female, established with Dr. Hilarie Fredrickson, and last seen in the office in 2022. She comes in today after recent hospitalization for discussion of dysphagia issues. Patient was hospitalized 1/19 through 07/26/2022 with RSV and acute exacerbation of chronic respiratory failure secondary to interstitial lung disease.  She is now on chronic O2 at 3 to 4 L.  She had also been ill in the fall with COVID-19.  Other medical issues include atrial fibrillation for which she is on Eliquis, hypertension, congestive heart failure, coronary artery disease, and hypothyroidism. She also has history of mesenteric ischemia and had undergone small bowel resection secondary to ischemia in 2021.  She had had prior episodes of segmental colitis felt to be associated with scad/diverticulosis and had been on mesalamine in the past. She has had a long history of dysphagia.  She had EGD per Dr. Sharlett Iles in 2013 which was an incomplete exam as he was unable to the scope beyond the pharynx. She had barium swallow done in December 2022 that did show moderate dysmotility with only transient/intermittent passage of contrast beyond the GE junction confirming patency but suggesting mild achalasia. She had been seen by ENT fall 2023/Dr. Hoshal, at that time having a lot of issues with persistent phlegm in the throat in addition to dysphagia to solids, generally had been doing okay with pills at that time of the notes.  She had laryngoscopy done which did show copious mucus, no masses or lesions and no pooling of secretions in the pharynx, copious mucus in the larynx, subglottis was visualized below the cords and the trachea appeared normal.  Dr. Silverio Decamp shall suggested she have GI evaluation for further evaluation of potential achalasia or cricopharyngeal dysfunction..  During her recent  hospitalization she was seen by speech pathology and had swallowing evaluation done which showed a normal oral pharyngeal swallow, osteophytes C4-C6 and a cricopharyngeal bar protruding into the esophagus however this did not impair her swallowing of any of the consistencies at the time of the exam though she complained of globus sensation.  Patient was discharged from the hospital to [facility] for short-term rehab.  Patient expresses that she has been very unhappy with her care there which she feels was very inadequate.  She is also was unhappy with the eating arrangements, as she has difficulty with getting winded with meals, new oxygen etc. found it very difficult to eat in a dining room with other people and did not feel that she had good choices of foods for her. Plan is for her to go home with home health care and family taking care of her tomorrow.  Her daughter does feel that her swallowing issues have improved somewhat she is not having any vomiting or regurgitation which she had been having prior to being in the hospital.  She still brings up a lot of mucus but patient says as long as she can spit this out she will feel better.  She had previously been on Mucinex but had not been receiving that at [facility] burn. When asked specifically today she does have a sensation of fullness in the lower throat constantly.  She can swallow her pills currently says she does best with thick liquids but does okay with thin liquids.  She does well with soft foods and has more difficulty with hard foods and larger  chunks of foods.  She does feel that she is breathing better with oxygen.  She says she always has some discomfort in her lower abdomen which is probably been worse over the past couple weeks because she has been on a variety of stool softeners which are making her stools to soft.  Not having any severe pain postprandially.  Review of Systems Pertinent positive and negative review of systems were noted in the  above HPI section.  All other review of systems was otherwise negative.   Outpatient Encounter Medications as of 08/17/2022  Medication Sig   acetaminophen (TYLENOL) 325 MG tablet Take 2 tablets (650 mg total) by mouth every 6 (six) hours as needed for mild pain (or Fever >/= 101).   apixaban (ELIQUIS) 2.5 MG TABS tablet Take 1 tablet (2.5 mg total) by mouth 2 (two) times daily. Needs cardiology appt for refills, call office   fluocinolone (SYNALAR) 0.01 % external solution Apply 5 drops topically 2 (two) times daily.   FLUoxetine (PROZAC) 20 MG capsule Take 1 capsule (20 mg total) by mouth daily.   guaiFENesin (MUCINEX) 600 MG 12 hr tablet Take 1 tablet (600 mg total) by mouth 2 (two) times daily.   ipratropium-albuterol (DUONEB) 0.5-2.5 (3) MG/3ML SOLN Take 3 mLs by nebulization every 4 (four) hours as needed.   levothyroxine (SYNTHROID) 50 MCG tablet Take 1 tablet (50 mcg total) by mouth daily.   nitroGLYCERIN (NITROSTAT) 0.4 MG SL tablet Place 0.4 mg under the tongue every 5 (five) minutes as needed for chest pain.   omeprazole (PRILOSEC) 40 MG capsule Take 1 capsule (40 mg total) by mouth 2 (two) times daily before a meal.   OXYGEN Inhale 3 L into the lungs as needed (dyspnea, chest pain).   Polyethylene Glycol 3350 (MIRALAX PO) Take 17 g by mouth daily.   pravastatin (PRAVACHOL) 40 MG tablet Take 1 tablet (40 mg total) by mouth at bedtime.   predniSONE (DELTASONE) 10 MG tablet Take 10 mg by mouth daily with breakfast.   psyllium (METAMUCIL) 58.6 % packet Take 1 packet by mouth daily as needed (constipation).   SYMBICORT 80-4.5 MCG/ACT inhaler INHALE 2 PUFFS INTO THE LUNGS TWICE DAILY (Patient taking differently: Inhale 2 puffs into the lungs 2 (two) times daily.)   traZODone (DESYREL) 50 MG tablet Take 1 tablet (50 mg total) by mouth at bedtime as needed. (Patient taking differently: Take 50 mg by mouth at bedtime.)   [DISCONTINUED] predniSONE (DELTASONE) 10 MG tablet 4 tablet daily for 2  days 3 tablet daily for 2 days 2 tablets daily for 2 days 1 tablet daily for 2 days   feeding supplement (ENSURE ENLIVE / ENSURE PLUS) LIQD Take 237 mLs by mouth 2 (two) times daily between meals. (Patient not taking: Reported on 08/17/2022)   [DISCONTINUED] guaiFENesin-dextromethorphan (ROBITUSSIN DM) 100-10 MG/5ML syrup Take 5 mLs by mouth every 4 (four) hours as needed for cough.   No facility-administered encounter medications on file as of 08/17/2022.   Allergies  Allergen Reactions   Doxycycline Other (See Comments)    Rash on face and neck   Tramadol     Constipation, abdominal obstruction leading to weakness after using this   Vicodin [Hydrocodone-Acetaminophen] Other (See Comments)    Hallucinations    Flagyl [Metronidazole] Nausea And Vomiting   Moxifloxacin Other (See Comments)     Weakness/fatigue   Pantoprazole Sodium Rash   Penicillins Rash    Has patient had a PCN reaction causing immediate rash, facial/tongue/throat swelling,  SOB or lightheadedness with hypotension:unsure Has patient had a PCN reaction causing severe rash involving mucus membranes or skin necrosis:unsure Has patient had a PCN reaction that required hospitalization:No Has patient had a PCN reaction occurring within the last 10 years:Yes Cannot recall exact reaction due to time lapse If all of the above answers are "NO", then may proceed with Cephalosporin use.  Other reaction(s): Unknown   Patient Active Problem List   Diagnosis Date Noted   Chronic hypoxic respiratory failure, on home oxygen therapy ([place]) 08/15/2022   Inadequate community resources 08/15/2022   At risk for falls 08/15/2022   Caregiver with fatigue 08/15/2022   Goals of care, counseling/discussion 08/09/2022   Itching of ear 08/09/2022   Dry eyes, bilateral 08/09/2022   Globus sensation 07/30/2022   Vomiting without nausea 07/30/2022   Early satiety 07/30/2022   Blood in stool 07/30/2022   DNR (do not resuscitate) 07/21/2022    RSV (respiratory syncytial virus infection) 07/20/2022   Hyperlipidemia, unspecified 04/16/2022   Palliative care encounter 07/27/2021   Constipation 07/27/2021   Cricopharyngeal achalasia 06/24/2021   Disorder of cervical esophageal region 06/23/2021   Acute respiratory failure with hypoxia (HCC) 06/18/2021   Chronic anemia 06/18/2021   Chronic pain of right knee 06/20/2020   Moderate protein-calorie malnutrition ([place]) 02/25/2020   S/P percutaneous endoscopic gastrostomy (PEG) tube placement ([place])    Debility 02/17/2020   Periprosthetic fracture around internal prosthetic hip joint 11/16/2019   Periprosthetic fracture around internal prosthetic hip joint, initial encounter    Essential hypertension    Chronic anticoagulation    Acute blood loss anemia    Subcapital fracture of femur ([place]) 10/30/2019   Leukocytosis    Hoarseness 02/19/2018   Sensorineural hearing loss (SNHL) of both ears 02/19/2018   Tinnitus, bilateral 02/19/2018   Cough 09/06/2017   NSIP (nonspecific interstitial pneumonia) ([place]) 09/26/2015   Chronic sinusitis 09/26/2015   ILD (interstitial lung disease) ([place]) 07/12/2014   Upper airway cough syndrome 09/23/2013   Heart failure with preserved ejection fraction ([place]) 07/08/2013   Coronary artery calcification seen on CAT scan 03/15/2013   Nodule of left lung 03/15/2013   Orthostatic lightheadedness 02/09/2013   Chest pain 09/03/2012   Depression    GERD (gastroesophageal reflux disease) 01/01/2012   Hypothyroid 11/23/2010   Edema 10/04/2010   Bronchiectasis (HCC)    Permanent atrial fibrillation ([place])    Social History   Socioeconomic History   Marital status: Married    Spouse name: Not on file   Number of children: 2   Years of education: Not on file   Highest education level: Not on file  Occupational History   Occupation: retired Product manager: [employer]  Tobacco Use   Smoking status: Never   Smokeless tobacco: Never  Vaping Use   Vaping  Use: Never used  Substance and Sexual Activity   Alcohol use: No   Drug use: No   Sexual activity: Not on file    Comment: Hysterectomy  Other Topics Concern   Not on file  Social History Narrative   husband and her separated/estranged and always. same bank accounts.    2 children 4 grandchildren-moved to [place] from [place] up in [place]. Moved to [place]. Moved to [place] Feb 2010 due to duaghther living in [place]. Son lives in [place], [place]. Husband works as Scientist, physiological in [place].      Retired Pharmacist, hospital.    Social Determinants of Health  Financial Resource Strain: Not on file  Food Insecurity: No Food Insecurity (07/20/2022)   Hunger Vital Sign    Worried About Running Out of Food in the Last Year: Never true    Ran Out of Food in the Last Year: Never true  Transportation Needs: No Transportation Needs (07/20/2022)   PRAPARE - Hydrologist (Medical): No    Lack of Transportation (Non-Medical): No  Physical Activity: Not on file  Stress: Not on file  Social Connections: Not on file  Intimate Partner Violence: Not At Risk (07/20/2022)   Humiliation, Afraid, Rape, and Kick questionnaire    Fear of Current or Ex-Partner: No    Emotionally Abused: No    Physically Abused: No    Sexually Abused: No    Ms. Roorda's family history includes Atrial fibrillation in an other family member; Cancer in her father; Colon polyps in her sister; Emphysema in her brother; Hypertension in her mother; Kidney disease in her sister; Other in her father; Pancreatic cancer in her sister; Stroke in her mother.      Objective:    There were no vitals filed for this visit.  Physical Exam Well-developed frail-appearing elderly white female, in a wheelchair, on nasal oxygen at 3 L, in no acute distress.  Patient comes in with her daughter, height, Weight 129, BMI 20.8  HEENT; nontraumatic normocephalic, EOMI, PE R LA, sclera  anicteric. Oropharynx; not examined today Neck; supple, no JVD Cardiovascular; regular rate and rhythm with S1-S2, no murmur rub or gallop Pulmonary; Clear bilaterally decreased breath sounds bilaterally Abdomen; soft, no focal tenderness, nondistended, no palpable mass or hepatosplenomegaly, bowel sounds are active Rectal; not done Skin; benign exam, no jaundice rash or appreciable lesions Extremities; no clubbing cyanosis or edema skin warm and dry Neuro/Psych; alert and oriented x4, grossly nonfocal mood and affect appropriate        Assessment & Plan:   #90 89 year old white female with severe chronic respiratory failure, now on oxygen 4 L secondary to interstitial lung disease, recent hospitalization with RSV, and currently recuperating at a nursing facility/[facility] with plans to be discharged tomorrow. Patient has initiated palliative care assistance, she is not on hospice currently  #2 chronic dysphagia, worse with solids and heavier foods, currently tolerating thin and thick liquids and soft foods with no sensation of transient food impaction, no regurgitation. Swallowing evaluation per speech path during her recent hospitalization with normal oral pharyngeal function, evidence of cricopharyngeal bar protruding into the esophagus however this did not impair her swallowing of any consistencies during the exam.  There was concern for esophageal issues more distally.  ENT evaluation fall 2023 with laryngoscopy showing copious mucus in the pharynx and larynx but otherwise unrevealing.  Prior barium swallows had raised concern for achalasia due to GE junction changes.  3 atrial fibrillation-on chronic Eliquis 4.  Hypertension 5.  Coronary artery disease 6.  Congestive heart failure 7.  History of mesenteric ischemia status post small bowel resection 2021 8.  Prior history of segmental colitis associated with diverticulosis  Plan; patient is a high risk candidate for endoscopic  evaluation at present with recent hospitalization with exacerbation of chronic respiratory failure and new oxygen requirement at 4 L.  Would start with a barium swallow with a tablet.  Fortunately she has actually been doing a bit better over the past couple of weeks, and think we can allow her to get home, continue recovery and make the following changes as outlined  below before pursuing a barium swallow. Long discussion with the patient and her daughter today, I think she will do better at home as she was quite unhappy with her care at [facility]. Encouraged them to start her on Ensure or boost at least twice daily, and to aim for a soft diet with avoidance of meats and larger pieces of food.  They will call in a few weeks especially if she has any worsening of symptoms, would schedule for barium swallow with a tablet at that time and then depending on findings we can discuss any potential endoscopic intervention.  As far as lower abdominal discomfort and bowel changes are concerned advised to get back on Metamucil daily, and add to start half dose of MiraLAX with the Metamucil, this can be increased to a full dose of MiraLAX as needed. Upright for all meals, and for 30 minutes after eating. Restart Mucinex twice daily for thinning of copious mucus  Abdulhamid Olgin S Taquana Bartley PA-C 08/17/2022   Cc: Marin Olp, MD

## 2022-08-17 NOTE — Patient Instructions (Addendum)
Protein supplements twice daily Please remain in a upright positions for meals and for 30 minutes following   Please purchase the following medications over the counter and take as directed:  Start metamucil daily can mix with half dose( 8 grams) of miralax daily. _______________________________________________________  If your blood pressure at your visit was 140/90 or greater, please contact your primary care physician to follow up on this.  _______________________________________________________  If you are age 87 or older, your body mass index should be between 23-30. Your There is no height or weight on file to calculate BMI. If this is out of the aforementioned range listed, please consider follow up with your Primary Care Provider.  If you are age 73 or younger, your body mass index should be between 19-25. Your There is no height or weight on file to calculate BMI. If this is out of the aformentioned range listed, please consider follow up with your Primary Care Provider.   ________________________________________________________  The [facility] [facility] providers would like to encourage you to use [facility] to communicate with providers for non-urgent requests or questions.  Due to long hold times on the telephone, sending your provider a message by [facility] may be a faster and more efficient way to get a response.  Please allow 48 business hours for a response.  Please remember that this is for non-urgent requests.  _______________________________________________________  Thank you for trusting me with your gastrointestinal care!   Amy Esterwood PA-C

## 2022-08-19 DIAGNOSIS — K589 Irritable bowel syndrome without diarrhea: Secondary | ICD-10-CM | POA: Diagnosis not present

## 2022-08-19 DIAGNOSIS — H409 Unspecified glaucoma: Secondary | ICD-10-CM | POA: Diagnosis not present

## 2022-08-19 DIAGNOSIS — E440 Moderate protein-calorie malnutrition: Secondary | ICD-10-CM | POA: Diagnosis not present

## 2022-08-19 DIAGNOSIS — K219 Gastro-esophageal reflux disease without esophagitis: Secondary | ICD-10-CM | POA: Diagnosis not present

## 2022-08-19 DIAGNOSIS — J479 Bronchiectasis, uncomplicated: Secondary | ICD-10-CM | POA: Diagnosis not present

## 2022-08-19 DIAGNOSIS — E871 Hypo-osmolality and hyponatremia: Secondary | ICD-10-CM | POA: Diagnosis not present

## 2022-08-19 DIAGNOSIS — I110 Hypertensive heart disease with heart failure: Secondary | ICD-10-CM | POA: Diagnosis not present

## 2022-08-19 DIAGNOSIS — K5792 Diverticulitis of intestine, part unspecified, without perforation or abscess without bleeding: Secondary | ICD-10-CM | POA: Diagnosis not present

## 2022-08-19 DIAGNOSIS — K5010 Crohn's disease of large intestine without complications: Secondary | ICD-10-CM | POA: Diagnosis not present

## 2022-08-19 DIAGNOSIS — I5030 Unspecified diastolic (congestive) heart failure: Secondary | ICD-10-CM | POA: Diagnosis not present

## 2022-08-19 DIAGNOSIS — J849 Interstitial pulmonary disease, unspecified: Secondary | ICD-10-CM | POA: Diagnosis not present

## 2022-08-19 DIAGNOSIS — F339 Major depressive disorder, recurrent, unspecified: Secondary | ICD-10-CM | POA: Diagnosis not present

## 2022-08-19 DIAGNOSIS — I700 Atherosclerosis of aorta: Secondary | ICD-10-CM | POA: Diagnosis not present

## 2022-08-19 DIAGNOSIS — M519 Unspecified thoracic, thoracolumbar and lumbosacral intervertebral disc disorder: Secondary | ICD-10-CM | POA: Diagnosis not present

## 2022-08-19 DIAGNOSIS — K439 Ventral hernia without obstruction or gangrene: Secondary | ICD-10-CM | POA: Diagnosis not present

## 2022-08-19 DIAGNOSIS — E039 Hypothyroidism, unspecified: Secondary | ICD-10-CM | POA: Diagnosis not present

## 2022-08-19 DIAGNOSIS — J329 Chronic sinusitis, unspecified: Secondary | ICD-10-CM | POA: Diagnosis not present

## 2022-08-19 DIAGNOSIS — J9611 Chronic respiratory failure with hypoxia: Secondary | ICD-10-CM | POA: Diagnosis not present

## 2022-08-19 DIAGNOSIS — G629 Polyneuropathy, unspecified: Secondary | ICD-10-CM | POA: Diagnosis not present

## 2022-08-19 DIAGNOSIS — I4821 Permanent atrial fibrillation: Secondary | ICD-10-CM | POA: Diagnosis not present

## 2022-08-19 DIAGNOSIS — E785 Hyperlipidemia, unspecified: Secondary | ICD-10-CM | POA: Diagnosis not present

## 2022-08-19 DIAGNOSIS — F419 Anxiety disorder, unspecified: Secondary | ICD-10-CM | POA: Diagnosis not present

## 2022-08-19 DIAGNOSIS — K760 Fatty (change of) liver, not elsewhere classified: Secondary | ICD-10-CM | POA: Diagnosis not present

## 2022-08-19 DIAGNOSIS — J449 Chronic obstructive pulmonary disease, unspecified: Secondary | ICD-10-CM | POA: Diagnosis not present

## 2022-08-19 DIAGNOSIS — R1319 Other dysphagia: Secondary | ICD-10-CM | POA: Diagnosis not present

## 2022-08-20 DIAGNOSIS — J479 Bronchiectasis, uncomplicated: Secondary | ICD-10-CM | POA: Diagnosis not present

## 2022-08-20 DIAGNOSIS — I5030 Unspecified diastolic (congestive) heart failure: Secondary | ICD-10-CM | POA: Diagnosis not present

## 2022-08-20 DIAGNOSIS — J9611 Chronic respiratory failure with hypoxia: Secondary | ICD-10-CM | POA: Diagnosis not present

## 2022-08-20 DIAGNOSIS — I110 Hypertensive heart disease with heart failure: Secondary | ICD-10-CM | POA: Diagnosis not present

## 2022-08-20 DIAGNOSIS — J849 Interstitial pulmonary disease, unspecified: Secondary | ICD-10-CM | POA: Diagnosis not present

## 2022-08-20 DIAGNOSIS — J449 Chronic obstructive pulmonary disease, unspecified: Secondary | ICD-10-CM | POA: Diagnosis not present

## 2022-08-20 MED ORDER — PREDNISONE 10 MG PO TABS: 10.0000 mg | ORAL_TABLET | Freq: Every day | ORAL | 2 refills | Status: DC

## 2022-08-20 NOTE — Addendum Note (Signed)
Addended by: Lorretta Harp on: 08/20/2022 09:40 AM   Modules accepted: Orders

## 2022-08-20 NOTE — Addendum Note (Signed)
Addended by: Jerene Bears on: 08/20/2022 12:53 PM   Modules accepted: Level of Service

## 2022-08-20 NOTE — Progress Notes (Signed)
Addendum: Reviewed and agree with assessment and management plan. Firmin Belisle M, MD  

## 2022-08-21 ENCOUNTER — Telehealth: Payer: Self-pay | Admitting: Family Medicine

## 2022-08-21 NOTE — Telephone Encounter (Signed)
Pt would like an order for Home Health. Please advise.

## 2022-08-21 NOTE — Telephone Encounter (Signed)
Please disregard earlier message.  Home Health Verbal Orders  Agency:  [facility]  Caller: Dyann Kief and title  Requesting OT/ PT/ Skilled nursing/ Social Work/ Speech:    Reason for Request:  PT twice a week for 6 weeks, then 1 a week for 2 weeks, need speach therapy evaluation approved.  Frequency:    HH needs F2F w/in last 30 days     [phone] - Can leave verbal on message

## 2022-08-22 DIAGNOSIS — I110 Hypertensive heart disease with heart failure: Secondary | ICD-10-CM | POA: Diagnosis not present

## 2022-08-22 DIAGNOSIS — J849 Interstitial pulmonary disease, unspecified: Secondary | ICD-10-CM | POA: Diagnosis not present

## 2022-08-22 DIAGNOSIS — J479 Bronchiectasis, uncomplicated: Secondary | ICD-10-CM | POA: Diagnosis not present

## 2022-08-22 DIAGNOSIS — J9611 Chronic respiratory failure with hypoxia: Secondary | ICD-10-CM | POA: Diagnosis not present

## 2022-08-22 DIAGNOSIS — J449 Chronic obstructive pulmonary disease, unspecified: Secondary | ICD-10-CM | POA: Diagnosis not present

## 2022-08-22 DIAGNOSIS — I5030 Unspecified diastolic (congestive) heart failure: Secondary | ICD-10-CM | POA: Diagnosis not present

## 2022-08-22 NOTE — Telephone Encounter (Signed)
Called and lm on [place] vm with VO.

## 2022-08-23 DIAGNOSIS — J9611 Chronic respiratory failure with hypoxia: Secondary | ICD-10-CM | POA: Diagnosis not present

## 2022-08-23 DIAGNOSIS — I110 Hypertensive heart disease with heart failure: Secondary | ICD-10-CM | POA: Diagnosis not present

## 2022-08-23 DIAGNOSIS — J849 Interstitial pulmonary disease, unspecified: Secondary | ICD-10-CM | POA: Diagnosis not present

## 2022-08-23 DIAGNOSIS — I5030 Unspecified diastolic (congestive) heart failure: Secondary | ICD-10-CM | POA: Diagnosis not present

## 2022-08-23 DIAGNOSIS — J479 Bronchiectasis, uncomplicated: Secondary | ICD-10-CM | POA: Diagnosis not present

## 2022-08-23 DIAGNOSIS — J449 Chronic obstructive pulmonary disease, unspecified: Secondary | ICD-10-CM | POA: Diagnosis not present

## 2022-08-24 DIAGNOSIS — J849 Interstitial pulmonary disease, unspecified: Secondary | ICD-10-CM | POA: Diagnosis not present

## 2022-08-24 DIAGNOSIS — I5030 Unspecified diastolic (congestive) heart failure: Secondary | ICD-10-CM | POA: Diagnosis not present

## 2022-08-24 DIAGNOSIS — I110 Hypertensive heart disease with heart failure: Secondary | ICD-10-CM | POA: Diagnosis not present

## 2022-08-24 DIAGNOSIS — J449 Chronic obstructive pulmonary disease, unspecified: Secondary | ICD-10-CM | POA: Diagnosis not present

## 2022-08-24 DIAGNOSIS — J479 Bronchiectasis, uncomplicated: Secondary | ICD-10-CM | POA: Diagnosis not present

## 2022-08-24 DIAGNOSIS — J9611 Chronic respiratory failure with hypoxia: Secondary | ICD-10-CM | POA: Diagnosis not present

## 2022-08-27 DIAGNOSIS — J9611 Chronic respiratory failure with hypoxia: Secondary | ICD-10-CM | POA: Diagnosis not present

## 2022-08-27 DIAGNOSIS — I5030 Unspecified diastolic (congestive) heart failure: Secondary | ICD-10-CM | POA: Diagnosis not present

## 2022-08-27 DIAGNOSIS — J849 Interstitial pulmonary disease, unspecified: Secondary | ICD-10-CM | POA: Diagnosis not present

## 2022-08-27 DIAGNOSIS — J479 Bronchiectasis, uncomplicated: Secondary | ICD-10-CM | POA: Diagnosis not present

## 2022-08-27 DIAGNOSIS — I110 Hypertensive heart disease with heart failure: Secondary | ICD-10-CM | POA: Diagnosis not present

## 2022-08-27 DIAGNOSIS — J449 Chronic obstructive pulmonary disease, unspecified: Secondary | ICD-10-CM | POA: Diagnosis not present

## 2022-08-28 ENCOUNTER — Other Ambulatory Visit: Payer: Medicare Other

## 2022-08-28 ENCOUNTER — Telehealth: Payer: Self-pay

## 2022-08-28 ENCOUNTER — Encounter: Payer: Self-pay | Admitting: Family Medicine

## 2022-08-28 ENCOUNTER — Telehealth: Payer: Self-pay | Admitting: Family Medicine

## 2022-08-28 NOTE — Telephone Encounter (Signed)
Pts daughter states pt might have a UTI but can't come into office for appt. Please advise.

## 2022-08-28 NOTE — Telephone Encounter (Signed)
Pt will need ov to determine.

## 2022-08-28 NOTE — Telephone Encounter (Signed)
(  9:12 am) PC SW completed a follow-up call to patient's daughter-[name] to reschedule appointment that cancelled by her. [name] advised that they are seeking other options for patient and did not need palliative at this time. She stated she will call back if/when services are needed.  Patient moved to inactive status.

## 2022-08-29 DIAGNOSIS — I110 Hypertensive heart disease with heart failure: Secondary | ICD-10-CM | POA: Diagnosis not present

## 2022-08-29 DIAGNOSIS — J9611 Chronic respiratory failure with hypoxia: Secondary | ICD-10-CM | POA: Diagnosis not present

## 2022-08-29 DIAGNOSIS — J449 Chronic obstructive pulmonary disease, unspecified: Secondary | ICD-10-CM | POA: Diagnosis not present

## 2022-08-29 DIAGNOSIS — J849 Interstitial pulmonary disease, unspecified: Secondary | ICD-10-CM | POA: Diagnosis not present

## 2022-08-29 DIAGNOSIS — J479 Bronchiectasis, uncomplicated: Secondary | ICD-10-CM | POA: Diagnosis not present

## 2022-08-29 DIAGNOSIS — I5030 Unspecified diastolic (congestive) heart failure: Secondary | ICD-10-CM | POA: Diagnosis not present

## 2022-08-29 NOTE — Telephone Encounter (Signed)
Caller states: - She needs to know if she can add a urine sample to the rest of patient's orders that are being completed prior to the weekend. - VM can be left @ [phone] with VO

## 2022-08-29 NOTE — Telephone Encounter (Signed)
Patient has home health, would like to know if we can add urine sample to her orders with [facility]. She cannot come into office due to limitations. Please call Clarise Cruz with [facility] at [phone] to add urine sample so that she can be seen at home for this issue today.

## 2022-08-30 DIAGNOSIS — J449 Chronic obstructive pulmonary disease, unspecified: Secondary | ICD-10-CM | POA: Diagnosis not present

## 2022-08-30 DIAGNOSIS — R350 Frequency of micturition: Secondary | ICD-10-CM | POA: Diagnosis not present

## 2022-08-30 DIAGNOSIS — I5030 Unspecified diastolic (congestive) heart failure: Secondary | ICD-10-CM | POA: Diagnosis not present

## 2022-08-30 DIAGNOSIS — I110 Hypertensive heart disease with heart failure: Secondary | ICD-10-CM | POA: Diagnosis not present

## 2022-08-30 DIAGNOSIS — J9611 Chronic respiratory failure with hypoxia: Secondary | ICD-10-CM | POA: Diagnosis not present

## 2022-08-30 DIAGNOSIS — Z87440 Personal history of urinary (tract) infections: Secondary | ICD-10-CM | POA: Diagnosis not present

## 2022-08-30 DIAGNOSIS — J479 Bronchiectasis, uncomplicated: Secondary | ICD-10-CM | POA: Diagnosis not present

## 2022-08-30 DIAGNOSIS — J849 Interstitial pulmonary disease, unspecified: Secondary | ICD-10-CM | POA: Diagnosis not present

## 2022-08-30 NOTE — Telephone Encounter (Signed)
Returned the call to Judson Roch and VO given.

## 2022-08-31 ENCOUNTER — Ambulatory Visit (HOSPITAL_COMMUNITY): Admission: RE | Admit: 2022-08-31 | Payer: Medicare Other | Source: Ambulatory Visit

## 2022-08-31 DIAGNOSIS — I4821 Permanent atrial fibrillation: Secondary | ICD-10-CM | POA: Diagnosis not present

## 2022-08-31 DIAGNOSIS — R42 Dizziness and giddiness: Secondary | ICD-10-CM | POA: Diagnosis not present

## 2022-08-31 DIAGNOSIS — I5032 Chronic diastolic (congestive) heart failure: Secondary | ICD-10-CM | POA: Diagnosis not present

## 2022-08-31 DIAGNOSIS — K220 Achalasia of cardia: Secondary | ICD-10-CM | POA: Diagnosis not present

## 2022-08-31 DIAGNOSIS — J849 Interstitial pulmonary disease, unspecified: Secondary | ICD-10-CM | POA: Diagnosis not present

## 2022-08-31 DIAGNOSIS — D649 Anemia, unspecified: Secondary | ICD-10-CM | POA: Diagnosis not present

## 2022-08-31 DIAGNOSIS — Z8781 Personal history of (healed) traumatic fracture: Secondary | ICD-10-CM | POA: Diagnosis not present

## 2022-08-31 DIAGNOSIS — Z9049 Acquired absence of other specified parts of digestive tract: Secondary | ICD-10-CM | POA: Diagnosis not present

## 2022-08-31 DIAGNOSIS — E039 Hypothyroidism, unspecified: Secondary | ICD-10-CM | POA: Diagnosis not present

## 2022-08-31 DIAGNOSIS — Z8719 Personal history of other diseases of the digestive system: Secondary | ICD-10-CM | POA: Diagnosis not present

## 2022-08-31 DIAGNOSIS — L309 Dermatitis, unspecified: Secondary | ICD-10-CM | POA: Diagnosis not present

## 2022-08-31 DIAGNOSIS — R634 Abnormal weight loss: Secondary | ICD-10-CM | POA: Diagnosis not present

## 2022-08-31 DIAGNOSIS — K219 Gastro-esophageal reflux disease without esophagitis: Secondary | ICD-10-CM | POA: Diagnosis not present

## 2022-08-31 DIAGNOSIS — G629 Polyneuropathy, unspecified: Secondary | ICD-10-CM | POA: Diagnosis not present

## 2022-08-31 DIAGNOSIS — I110 Hypertensive heart disease with heart failure: Secondary | ICD-10-CM | POA: Diagnosis not present

## 2022-09-01 ENCOUNTER — Other Ambulatory Visit: Payer: Self-pay | Admitting: Internal Medicine

## 2022-09-02 ENCOUNTER — Encounter: Payer: Self-pay | Admitting: Internal Medicine

## 2022-09-03 ENCOUNTER — Encounter: Payer: Self-pay | Admitting: Family Medicine

## 2022-09-03 ENCOUNTER — Other Ambulatory Visit: Payer: Self-pay | Admitting: Internal Medicine

## 2022-09-03 DIAGNOSIS — Z8719 Personal history of other diseases of the digestive system: Secondary | ICD-10-CM | POA: Diagnosis not present

## 2022-09-03 DIAGNOSIS — K220 Achalasia of cardia: Secondary | ICD-10-CM | POA: Diagnosis not present

## 2022-09-03 DIAGNOSIS — I110 Hypertensive heart disease with heart failure: Secondary | ICD-10-CM | POA: Diagnosis not present

## 2022-09-03 DIAGNOSIS — I4821 Permanent atrial fibrillation: Secondary | ICD-10-CM | POA: Diagnosis not present

## 2022-09-03 DIAGNOSIS — J849 Interstitial pulmonary disease, unspecified: Secondary | ICD-10-CM | POA: Diagnosis not present

## 2022-09-03 DIAGNOSIS — I5032 Chronic diastolic (congestive) heart failure: Secondary | ICD-10-CM | POA: Diagnosis not present

## 2022-09-03 DIAGNOSIS — I4811 Longstanding persistent atrial fibrillation: Secondary | ICD-10-CM

## 2022-09-03 NOTE — Telephone Encounter (Signed)
Can you please make 09/04/22 visit a video visit - I can video with [name] from the hospice with Jolene Provost or at the least talk to her on the phone at that time. Ideal is video

## 2022-09-03 NOTE — Telephone Encounter (Signed)
Prescription refill request for Eliquis received. Indication: a fib Last office visit: 06/08/22 Scr: 0.64 on 07/24/22 Age: 89 Weight: 58kg

## 2022-09-03 NOTE — Telephone Encounter (Signed)
Mychart message sent by pt's daughter:  Stephanie Cortez [facility] (supporting Brand Males, MD)21 hours ago (2:52 PM)    Hi Dr Chase Caller, I hope you are doing well.    I sent Stephanie Cortez a couple of messages and have not heard back yet.    Mom started Hospice at home on Friday and she is already feeling more relaxed and comfortable with less symptoms like clearing her throat over and over, and asking for "more air so she can breathe" when she wakes at night afraid.  The nurses we have met have been loving and very supportive of what she needs.    I wondered if you could look at mom's latest Ct scan of her lungs, and consider talking with mom on the phone about how far her ild has progressed, since mom was too weak to make it to the Ct scan last  week and can't come to her scheduled appointment with you Tuesday.   It might be helpful for her to hear from you that she fought an amazing, hard fight.   What do you think? Perhaps it's not necessary. Let me know. She thinks the world of you, and I'll always remember how you treated mom with respect and kindness and helped [place] navigate the journey. She is such a strong woman.   Thank you, [name]    Dr. Chase Caller, please advise.

## 2022-09-04 ENCOUNTER — Telehealth: Payer: Medicare Other | Admitting: Internal Medicine

## 2022-09-04 ENCOUNTER — Ambulatory Visit: Payer: Medicare Other | Admitting: Family Medicine

## 2022-09-05 ENCOUNTER — Encounter: Payer: Self-pay | Admitting: Family Medicine

## 2022-09-05 ENCOUNTER — Other Ambulatory Visit: Payer: Self-pay | Admitting: Family Medicine

## 2022-09-05 DIAGNOSIS — K220 Achalasia of cardia: Secondary | ICD-10-CM | POA: Diagnosis not present

## 2022-09-05 DIAGNOSIS — J849 Interstitial pulmonary disease, unspecified: Secondary | ICD-10-CM | POA: Diagnosis not present

## 2022-09-05 DIAGNOSIS — Z8719 Personal history of other diseases of the digestive system: Secondary | ICD-10-CM | POA: Diagnosis not present

## 2022-09-05 DIAGNOSIS — I110 Hypertensive heart disease with heart failure: Secondary | ICD-10-CM | POA: Diagnosis not present

## 2022-09-05 DIAGNOSIS — I5032 Chronic diastolic (congestive) heart failure: Secondary | ICD-10-CM | POA: Diagnosis not present

## 2022-09-05 DIAGNOSIS — I4821 Permanent atrial fibrillation: Secondary | ICD-10-CM | POA: Diagnosis not present

## 2022-09-05 LAB — ACID FAST CULTURE WITH REFLEXED SENSITIVITIES (MYCOBACTERIA): Acid Fast Culture: NEGATIVE

## 2022-09-05 MED ORDER — NITROFURANTOIN MONOHYD MACRO 100 MG PO CAPS: 100.0000 mg | ORAL_CAPSULE | Freq: Two times a day (BID) | ORAL | 0 refills | Status: DC

## 2022-09-05 NOTE — Telephone Encounter (Signed)
Pt was scheduled for CT on 3/1 and she cancelled appt - she was not asking for appt to be moved up.  Reading the message she is asking if MR can make determination from cxr that was done last month and if she can have a virtual visit.  Does CT need to be rescheduled?

## 2022-09-05 NOTE — Telephone Encounter (Signed)
Patient's daughter [name] requests to be called at ph# [phone] re: is Dr. Yong Channel sending RX for antibiotic for UTI to [facility] or does Hospice Nurse Sarah send in the RX (states allergic to Penicillin).

## 2022-09-05 NOTE — Telephone Encounter (Signed)
See below

## 2022-09-06 DIAGNOSIS — J849 Interstitial pulmonary disease, unspecified: Secondary | ICD-10-CM | POA: Diagnosis not present

## 2022-09-06 DIAGNOSIS — I5032 Chronic diastolic (congestive) heart failure: Secondary | ICD-10-CM | POA: Diagnosis not present

## 2022-09-06 DIAGNOSIS — I4821 Permanent atrial fibrillation: Secondary | ICD-10-CM | POA: Diagnosis not present

## 2022-09-06 DIAGNOSIS — K220 Achalasia of cardia: Secondary | ICD-10-CM | POA: Diagnosis not present

## 2022-09-06 DIAGNOSIS — I110 Hypertensive heart disease with heart failure: Secondary | ICD-10-CM | POA: Diagnosis not present

## 2022-09-06 DIAGNOSIS — Z8719 Personal history of other diseases of the digestive system: Secondary | ICD-10-CM | POA: Diagnosis not present

## 2022-09-10 DIAGNOSIS — K220 Achalasia of cardia: Secondary | ICD-10-CM | POA: Diagnosis not present

## 2022-09-10 DIAGNOSIS — I110 Hypertensive heart disease with heart failure: Secondary | ICD-10-CM | POA: Diagnosis not present

## 2022-09-10 DIAGNOSIS — J849 Interstitial pulmonary disease, unspecified: Secondary | ICD-10-CM | POA: Diagnosis not present

## 2022-09-10 DIAGNOSIS — I4821 Permanent atrial fibrillation: Secondary | ICD-10-CM | POA: Diagnosis not present

## 2022-09-10 DIAGNOSIS — I5032 Chronic diastolic (congestive) heart failure: Secondary | ICD-10-CM | POA: Diagnosis not present

## 2022-09-10 DIAGNOSIS — Z8719 Personal history of other diseases of the digestive system: Secondary | ICD-10-CM | POA: Diagnosis not present

## 2022-09-12 DIAGNOSIS — J849 Interstitial pulmonary disease, unspecified: Secondary | ICD-10-CM | POA: Diagnosis not present

## 2022-09-12 DIAGNOSIS — I5032 Chronic diastolic (congestive) heart failure: Secondary | ICD-10-CM | POA: Diagnosis not present

## 2022-09-12 DIAGNOSIS — I4821 Permanent atrial fibrillation: Secondary | ICD-10-CM | POA: Diagnosis not present

## 2022-09-12 DIAGNOSIS — K220 Achalasia of cardia: Secondary | ICD-10-CM | POA: Diagnosis not present

## 2022-09-12 DIAGNOSIS — I110 Hypertensive heart disease with heart failure: Secondary | ICD-10-CM | POA: Diagnosis not present

## 2022-09-12 DIAGNOSIS — Z8719 Personal history of other diseases of the digestive system: Secondary | ICD-10-CM | POA: Diagnosis not present

## 2022-09-13 DIAGNOSIS — Z8719 Personal history of other diseases of the digestive system: Secondary | ICD-10-CM | POA: Diagnosis not present

## 2022-09-13 DIAGNOSIS — K220 Achalasia of cardia: Secondary | ICD-10-CM | POA: Diagnosis not present

## 2022-09-13 DIAGNOSIS — I110 Hypertensive heart disease with heart failure: Secondary | ICD-10-CM | POA: Diagnosis not present

## 2022-09-13 DIAGNOSIS — I4821 Permanent atrial fibrillation: Secondary | ICD-10-CM | POA: Diagnosis not present

## 2022-09-13 DIAGNOSIS — I5032 Chronic diastolic (congestive) heart failure: Secondary | ICD-10-CM | POA: Diagnosis not present

## 2022-09-13 DIAGNOSIS — J849 Interstitial pulmonary disease, unspecified: Secondary | ICD-10-CM | POA: Diagnosis not present

## 2022-09-15 DIAGNOSIS — I110 Hypertensive heart disease with heart failure: Secondary | ICD-10-CM | POA: Diagnosis not present

## 2022-09-15 DIAGNOSIS — I5032 Chronic diastolic (congestive) heart failure: Secondary | ICD-10-CM | POA: Diagnosis not present

## 2022-09-15 DIAGNOSIS — K220 Achalasia of cardia: Secondary | ICD-10-CM | POA: Diagnosis not present

## 2022-09-15 DIAGNOSIS — J849 Interstitial pulmonary disease, unspecified: Secondary | ICD-10-CM | POA: Diagnosis not present

## 2022-09-15 DIAGNOSIS — Z8719 Personal history of other diseases of the digestive system: Secondary | ICD-10-CM | POA: Diagnosis not present

## 2022-09-15 DIAGNOSIS — I4821 Permanent atrial fibrillation: Secondary | ICD-10-CM | POA: Diagnosis not present

## 2022-09-17 DIAGNOSIS — J849 Interstitial pulmonary disease, unspecified: Secondary | ICD-10-CM | POA: Diagnosis not present

## 2022-09-17 DIAGNOSIS — I4821 Permanent atrial fibrillation: Secondary | ICD-10-CM | POA: Diagnosis not present

## 2022-09-17 DIAGNOSIS — I110 Hypertensive heart disease with heart failure: Secondary | ICD-10-CM | POA: Diagnosis not present

## 2022-09-17 DIAGNOSIS — I5032 Chronic diastolic (congestive) heart failure: Secondary | ICD-10-CM | POA: Diagnosis not present

## 2022-09-17 DIAGNOSIS — Z8719 Personal history of other diseases of the digestive system: Secondary | ICD-10-CM | POA: Diagnosis not present

## 2022-09-17 DIAGNOSIS — K220 Achalasia of cardia: Secondary | ICD-10-CM | POA: Diagnosis not present

## 2022-09-18 DIAGNOSIS — Z8719 Personal history of other diseases of the digestive system: Secondary | ICD-10-CM | POA: Diagnosis not present

## 2022-09-18 DIAGNOSIS — I4821 Permanent atrial fibrillation: Secondary | ICD-10-CM | POA: Diagnosis not present

## 2022-09-18 DIAGNOSIS — I5032 Chronic diastolic (congestive) heart failure: Secondary | ICD-10-CM | POA: Diagnosis not present

## 2022-09-18 DIAGNOSIS — I110 Hypertensive heart disease with heart failure: Secondary | ICD-10-CM | POA: Diagnosis not present

## 2022-09-18 DIAGNOSIS — J849 Interstitial pulmonary disease, unspecified: Secondary | ICD-10-CM | POA: Diagnosis not present

## 2022-09-18 DIAGNOSIS — K220 Achalasia of cardia: Secondary | ICD-10-CM | POA: Diagnosis not present

## 2022-09-18 NOTE — Telephone Encounter (Signed)
MR, please advise on pt message regarding home health. Thanks.

## 2022-09-19 DIAGNOSIS — Z8719 Personal history of other diseases of the digestive system: Secondary | ICD-10-CM | POA: Diagnosis not present

## 2022-09-19 DIAGNOSIS — I4821 Permanent atrial fibrillation: Secondary | ICD-10-CM | POA: Diagnosis not present

## 2022-09-19 DIAGNOSIS — J849 Interstitial pulmonary disease, unspecified: Secondary | ICD-10-CM | POA: Diagnosis not present

## 2022-09-19 DIAGNOSIS — K220 Achalasia of cardia: Secondary | ICD-10-CM | POA: Diagnosis not present

## 2022-09-19 DIAGNOSIS — I110 Hypertensive heart disease with heart failure: Secondary | ICD-10-CM | POA: Diagnosis not present

## 2022-09-19 DIAGNOSIS — I5032 Chronic diastolic (congestive) heart failure: Secondary | ICD-10-CM | POA: Diagnosis not present

## 2022-09-20 DIAGNOSIS — J849 Interstitial pulmonary disease, unspecified: Secondary | ICD-10-CM | POA: Diagnosis not present

## 2022-09-20 DIAGNOSIS — I110 Hypertensive heart disease with heart failure: Secondary | ICD-10-CM | POA: Diagnosis not present

## 2022-09-20 DIAGNOSIS — K220 Achalasia of cardia: Secondary | ICD-10-CM | POA: Diagnosis not present

## 2022-09-20 DIAGNOSIS — I4821 Permanent atrial fibrillation: Secondary | ICD-10-CM | POA: Diagnosis not present

## 2022-09-20 DIAGNOSIS — I5032 Chronic diastolic (congestive) heart failure: Secondary | ICD-10-CM | POA: Diagnosis not present

## 2022-09-20 DIAGNOSIS — Z8719 Personal history of other diseases of the digestive system: Secondary | ICD-10-CM | POA: Diagnosis not present

## 2022-09-20 NOTE — Telephone Encounter (Signed)
I knew a lady [place] but she moved back to [place]. She took care of my daughter and my parents when they visited. I can certainly look around

## 2022-09-21 DIAGNOSIS — J849 Interstitial pulmonary disease, unspecified: Secondary | ICD-10-CM | POA: Diagnosis not present

## 2022-09-21 DIAGNOSIS — I110 Hypertensive heart disease with heart failure: Secondary | ICD-10-CM | POA: Diagnosis not present

## 2022-09-21 DIAGNOSIS — Z8719 Personal history of other diseases of the digestive system: Secondary | ICD-10-CM | POA: Diagnosis not present

## 2022-09-21 DIAGNOSIS — K220 Achalasia of cardia: Secondary | ICD-10-CM | POA: Diagnosis not present

## 2022-09-21 DIAGNOSIS — I4821 Permanent atrial fibrillation: Secondary | ICD-10-CM | POA: Diagnosis not present

## 2022-09-21 DIAGNOSIS — I5032 Chronic diastolic (congestive) heart failure: Secondary | ICD-10-CM | POA: Diagnosis not present

## 2022-09-22 DIAGNOSIS — J849 Interstitial pulmonary disease, unspecified: Secondary | ICD-10-CM | POA: Diagnosis not present

## 2022-09-22 DIAGNOSIS — I4821 Permanent atrial fibrillation: Secondary | ICD-10-CM | POA: Diagnosis not present

## 2022-09-22 DIAGNOSIS — Z8719 Personal history of other diseases of the digestive system: Secondary | ICD-10-CM | POA: Diagnosis not present

## 2022-09-22 DIAGNOSIS — I5032 Chronic diastolic (congestive) heart failure: Secondary | ICD-10-CM | POA: Diagnosis not present

## 2022-09-22 DIAGNOSIS — I110 Hypertensive heart disease with heart failure: Secondary | ICD-10-CM | POA: Diagnosis not present

## 2022-09-22 DIAGNOSIS — K220 Achalasia of cardia: Secondary | ICD-10-CM | POA: Diagnosis not present

## 2022-09-24 DIAGNOSIS — J849 Interstitial pulmonary disease, unspecified: Secondary | ICD-10-CM | POA: Diagnosis not present

## 2022-09-24 DIAGNOSIS — Z8719 Personal history of other diseases of the digestive system: Secondary | ICD-10-CM | POA: Diagnosis not present

## 2022-09-24 DIAGNOSIS — I4821 Permanent atrial fibrillation: Secondary | ICD-10-CM | POA: Diagnosis not present

## 2022-09-24 DIAGNOSIS — K220 Achalasia of cardia: Secondary | ICD-10-CM | POA: Diagnosis not present

## 2022-09-24 DIAGNOSIS — I110 Hypertensive heart disease with heart failure: Secondary | ICD-10-CM | POA: Diagnosis not present

## 2022-09-24 DIAGNOSIS — I5032 Chronic diastolic (congestive) heart failure: Secondary | ICD-10-CM | POA: Diagnosis not present

## 2022-09-25 ENCOUNTER — Telehealth: Payer: Self-pay | Admitting: Family Medicine

## 2022-09-25 DIAGNOSIS — E039 Hypothyroidism, unspecified: Secondary | ICD-10-CM

## 2022-09-25 DIAGNOSIS — H905 Unspecified sensorineural hearing loss: Secondary | ICD-10-CM

## 2022-09-25 DIAGNOSIS — I4821 Permanent atrial fibrillation: Secondary | ICD-10-CM | POA: Diagnosis not present

## 2022-09-25 DIAGNOSIS — H409 Unspecified glaucoma: Secondary | ICD-10-CM

## 2022-09-25 DIAGNOSIS — J449 Chronic obstructive pulmonary disease, unspecified: Secondary | ICD-10-CM

## 2022-09-25 DIAGNOSIS — G629 Polyneuropathy, unspecified: Secondary | ICD-10-CM

## 2022-09-25 DIAGNOSIS — J479 Bronchiectasis, uncomplicated: Secondary | ICD-10-CM

## 2022-09-25 DIAGNOSIS — E871 Hypo-osmolality and hyponatremia: Secondary | ICD-10-CM

## 2022-09-25 DIAGNOSIS — K439 Ventral hernia without obstruction or gangrene: Secondary | ICD-10-CM

## 2022-09-25 DIAGNOSIS — K220 Achalasia of cardia: Secondary | ICD-10-CM | POA: Diagnosis not present

## 2022-09-25 DIAGNOSIS — M519 Unspecified thoracic, thoracolumbar and lumbosacral intervertebral disc disorder: Secondary | ICD-10-CM

## 2022-09-25 DIAGNOSIS — K5792 Diverticulitis of intestine, part unspecified, without perforation or abscess without bleeding: Secondary | ICD-10-CM

## 2022-09-25 DIAGNOSIS — F339 Major depressive disorder, recurrent, unspecified: Secondary | ICD-10-CM

## 2022-09-25 DIAGNOSIS — E440 Moderate protein-calorie malnutrition: Secondary | ICD-10-CM

## 2022-09-25 DIAGNOSIS — Z9181 History of falling: Secondary | ICD-10-CM

## 2022-09-25 DIAGNOSIS — I110 Hypertensive heart disease with heart failure: Secondary | ICD-10-CM | POA: Diagnosis not present

## 2022-09-25 DIAGNOSIS — K219 Gastro-esophageal reflux disease without esophagitis: Secondary | ICD-10-CM

## 2022-09-25 DIAGNOSIS — K5010 Crohn's disease of large intestine without complications: Secondary | ICD-10-CM

## 2022-09-25 DIAGNOSIS — I5032 Chronic diastolic (congestive) heart failure: Secondary | ICD-10-CM | POA: Diagnosis not present

## 2022-09-25 DIAGNOSIS — K760 Fatty (change of) liver, not elsewhere classified: Secondary | ICD-10-CM

## 2022-09-25 DIAGNOSIS — Z9981 Dependence on supplemental oxygen: Secondary | ICD-10-CM

## 2022-09-25 DIAGNOSIS — Z8719 Personal history of other diseases of the digestive system: Secondary | ICD-10-CM | POA: Diagnosis not present

## 2022-09-25 DIAGNOSIS — Z7901 Long term (current) use of anticoagulants: Secondary | ICD-10-CM

## 2022-09-25 DIAGNOSIS — Z7951 Long term (current) use of inhaled steroids: Secondary | ICD-10-CM

## 2022-09-25 DIAGNOSIS — E785 Hyperlipidemia, unspecified: Secondary | ICD-10-CM

## 2022-09-25 DIAGNOSIS — I5030 Unspecified diastolic (congestive) heart failure: Secondary | ICD-10-CM

## 2022-09-25 DIAGNOSIS — J329 Chronic sinusitis, unspecified: Secondary | ICD-10-CM

## 2022-09-25 DIAGNOSIS — K589 Irritable bowel syndrome without diarrhea: Secondary | ICD-10-CM

## 2022-09-25 DIAGNOSIS — I700 Atherosclerosis of aorta: Secondary | ICD-10-CM

## 2022-09-25 DIAGNOSIS — J849 Interstitial pulmonary disease, unspecified: Secondary | ICD-10-CM | POA: Diagnosis not present

## 2022-09-25 DIAGNOSIS — F419 Anxiety disorder, unspecified: Secondary | ICD-10-CM

## 2022-09-25 DIAGNOSIS — J9611 Chronic respiratory failure with hypoxia: Secondary | ICD-10-CM

## 2022-09-25 DIAGNOSIS — R1319 Other dysphagia: Secondary | ICD-10-CM

## 2022-09-25 NOTE — Telephone Encounter (Signed)
Copied from [place] [id number]. Topic: Medicare AWV >> Sep 25, 2022  3:43 PM Gillis Santa wrote: Reason for CRM: Called patient to schedule Medicare Annual Wellness Visit (AWV). Left message for patient to call back and schedule Medicare Annual Wellness Visit (AWV).  Last date of AWV: N/A  Please schedule an appointment at any time with Otila Kluver, [facility]. Please schedule AWVS with Otila Kluver, [place].  If any questions, please contact me at [phone].  Thank you ,  Shaune Pollack [facility] AWV TEAM Direct Dial [phone]

## 2022-09-25 NOTE — Telephone Encounter (Signed)
.  Home Health Certification or Plan of Care Tracking  Is this a Certification or Plan of Care? Yes  [place] Agency: [place] [place] Number:  [id number]  Has charge sheet been attached? yes  Where has form been placed:  In provider's box  Faxed to:   [phone]

## 2022-09-26 DIAGNOSIS — I5032 Chronic diastolic (congestive) heart failure: Secondary | ICD-10-CM | POA: Diagnosis not present

## 2022-09-26 DIAGNOSIS — J849 Interstitial pulmonary disease, unspecified: Secondary | ICD-10-CM | POA: Diagnosis not present

## 2022-09-26 DIAGNOSIS — I4821 Permanent atrial fibrillation: Secondary | ICD-10-CM | POA: Diagnosis not present

## 2022-09-26 DIAGNOSIS — I110 Hypertensive heart disease with heart failure: Secondary | ICD-10-CM | POA: Diagnosis not present

## 2022-09-26 DIAGNOSIS — K220 Achalasia of cardia: Secondary | ICD-10-CM | POA: Diagnosis not present

## 2022-09-26 DIAGNOSIS — Z8719 Personal history of other diseases of the digestive system: Secondary | ICD-10-CM | POA: Diagnosis not present

## 2022-09-27 ENCOUNTER — Other Ambulatory Visit (HOSPITAL_COMMUNITY): Payer: Self-pay

## 2022-09-27 DIAGNOSIS — J849 Interstitial pulmonary disease, unspecified: Secondary | ICD-10-CM | POA: Diagnosis not present

## 2022-09-27 DIAGNOSIS — I110 Hypertensive heart disease with heart failure: Secondary | ICD-10-CM | POA: Diagnosis not present

## 2022-09-27 DIAGNOSIS — K220 Achalasia of cardia: Secondary | ICD-10-CM | POA: Diagnosis not present

## 2022-09-27 DIAGNOSIS — I4821 Permanent atrial fibrillation: Secondary | ICD-10-CM | POA: Diagnosis not present

## 2022-09-27 DIAGNOSIS — Z8719 Personal history of other diseases of the digestive system: Secondary | ICD-10-CM | POA: Diagnosis not present

## 2022-09-27 DIAGNOSIS — I5032 Chronic diastolic (congestive) heart failure: Secondary | ICD-10-CM | POA: Diagnosis not present

## 2022-09-28 DIAGNOSIS — I4821 Permanent atrial fibrillation: Secondary | ICD-10-CM | POA: Diagnosis not present

## 2022-09-28 DIAGNOSIS — I5032 Chronic diastolic (congestive) heart failure: Secondary | ICD-10-CM | POA: Diagnosis not present

## 2022-09-28 DIAGNOSIS — I110 Hypertensive heart disease with heart failure: Secondary | ICD-10-CM | POA: Diagnosis not present

## 2022-09-28 DIAGNOSIS — K220 Achalasia of cardia: Secondary | ICD-10-CM | POA: Diagnosis not present

## 2022-09-28 DIAGNOSIS — J849 Interstitial pulmonary disease, unspecified: Secondary | ICD-10-CM | POA: Diagnosis not present

## 2022-09-28 DIAGNOSIS — Z8719 Personal history of other diseases of the digestive system: Secondary | ICD-10-CM | POA: Diagnosis not present

## 2022-10-01 DIAGNOSIS — J849 Interstitial pulmonary disease, unspecified: Secondary | ICD-10-CM | POA: Diagnosis not present

## 2022-10-01 DIAGNOSIS — E039 Hypothyroidism, unspecified: Secondary | ICD-10-CM | POA: Diagnosis not present

## 2022-10-01 DIAGNOSIS — D649 Anemia, unspecified: Secondary | ICD-10-CM | POA: Diagnosis not present

## 2022-10-01 DIAGNOSIS — L309 Dermatitis, unspecified: Secondary | ICD-10-CM | POA: Diagnosis not present

## 2022-10-01 DIAGNOSIS — R42 Dizziness and giddiness: Secondary | ICD-10-CM | POA: Diagnosis not present

## 2022-10-01 DIAGNOSIS — I4821 Permanent atrial fibrillation: Secondary | ICD-10-CM | POA: Diagnosis not present

## 2022-10-01 DIAGNOSIS — I5032 Chronic diastolic (congestive) heart failure: Secondary | ICD-10-CM | POA: Diagnosis not present

## 2022-10-01 DIAGNOSIS — Z8781 Personal history of (healed) traumatic fracture: Secondary | ICD-10-CM | POA: Diagnosis not present

## 2022-10-01 DIAGNOSIS — Z8719 Personal history of other diseases of the digestive system: Secondary | ICD-10-CM | POA: Diagnosis not present

## 2022-10-01 DIAGNOSIS — R634 Abnormal weight loss: Secondary | ICD-10-CM | POA: Diagnosis not present

## 2022-10-01 DIAGNOSIS — G629 Polyneuropathy, unspecified: Secondary | ICD-10-CM | POA: Diagnosis not present

## 2022-10-01 DIAGNOSIS — Z9049 Acquired absence of other specified parts of digestive tract: Secondary | ICD-10-CM | POA: Diagnosis not present

## 2022-10-01 DIAGNOSIS — K220 Achalasia of cardia: Secondary | ICD-10-CM | POA: Diagnosis not present

## 2022-10-01 DIAGNOSIS — K219 Gastro-esophageal reflux disease without esophagitis: Secondary | ICD-10-CM | POA: Diagnosis not present

## 2022-10-01 DIAGNOSIS — I110 Hypertensive heart disease with heart failure: Secondary | ICD-10-CM | POA: Diagnosis not present

## 2022-10-02 DIAGNOSIS — K220 Achalasia of cardia: Secondary | ICD-10-CM | POA: Diagnosis not present

## 2022-10-02 DIAGNOSIS — I5032 Chronic diastolic (congestive) heart failure: Secondary | ICD-10-CM | POA: Diagnosis not present

## 2022-10-02 DIAGNOSIS — J849 Interstitial pulmonary disease, unspecified: Secondary | ICD-10-CM | POA: Diagnosis not present

## 2022-10-02 DIAGNOSIS — I110 Hypertensive heart disease with heart failure: Secondary | ICD-10-CM | POA: Diagnosis not present

## 2022-10-02 DIAGNOSIS — Z8719 Personal history of other diseases of the digestive system: Secondary | ICD-10-CM | POA: Diagnosis not present

## 2022-10-02 DIAGNOSIS — I4821 Permanent atrial fibrillation: Secondary | ICD-10-CM | POA: Diagnosis not present

## 2022-10-03 DIAGNOSIS — K220 Achalasia of cardia: Secondary | ICD-10-CM | POA: Diagnosis not present

## 2022-10-03 DIAGNOSIS — J849 Interstitial pulmonary disease, unspecified: Secondary | ICD-10-CM | POA: Diagnosis not present

## 2022-10-03 DIAGNOSIS — Z8719 Personal history of other diseases of the digestive system: Secondary | ICD-10-CM | POA: Diagnosis not present

## 2022-10-03 DIAGNOSIS — I4821 Permanent atrial fibrillation: Secondary | ICD-10-CM | POA: Diagnosis not present

## 2022-10-03 DIAGNOSIS — I110 Hypertensive heart disease with heart failure: Secondary | ICD-10-CM | POA: Diagnosis not present

## 2022-10-03 DIAGNOSIS — I5032 Chronic diastolic (congestive) heart failure: Secondary | ICD-10-CM | POA: Diagnosis not present

## 2022-10-04 DIAGNOSIS — I110 Hypertensive heart disease with heart failure: Secondary | ICD-10-CM | POA: Diagnosis not present

## 2022-10-04 DIAGNOSIS — I5032 Chronic diastolic (congestive) heart failure: Secondary | ICD-10-CM | POA: Diagnosis not present

## 2022-10-04 DIAGNOSIS — K220 Achalasia of cardia: Secondary | ICD-10-CM | POA: Diagnosis not present

## 2022-10-04 DIAGNOSIS — Z8719 Personal history of other diseases of the digestive system: Secondary | ICD-10-CM | POA: Diagnosis not present

## 2022-10-04 DIAGNOSIS — I4821 Permanent atrial fibrillation: Secondary | ICD-10-CM | POA: Diagnosis not present

## 2022-10-04 DIAGNOSIS — J849 Interstitial pulmonary disease, unspecified: Secondary | ICD-10-CM | POA: Diagnosis not present

## 2022-10-05 DIAGNOSIS — Z8719 Personal history of other diseases of the digestive system: Secondary | ICD-10-CM | POA: Diagnosis not present

## 2022-10-05 DIAGNOSIS — K220 Achalasia of cardia: Secondary | ICD-10-CM | POA: Diagnosis not present

## 2022-10-05 DIAGNOSIS — J849 Interstitial pulmonary disease, unspecified: Secondary | ICD-10-CM | POA: Diagnosis not present

## 2022-10-05 DIAGNOSIS — I5032 Chronic diastolic (congestive) heart failure: Secondary | ICD-10-CM | POA: Diagnosis not present

## 2022-10-05 DIAGNOSIS — I4821 Permanent atrial fibrillation: Secondary | ICD-10-CM | POA: Diagnosis not present

## 2022-10-05 DIAGNOSIS — I110 Hypertensive heart disease with heart failure: Secondary | ICD-10-CM | POA: Diagnosis not present

## 2022-10-06 DIAGNOSIS — I4821 Permanent atrial fibrillation: Secondary | ICD-10-CM | POA: Diagnosis not present

## 2022-10-06 DIAGNOSIS — K220 Achalasia of cardia: Secondary | ICD-10-CM | POA: Diagnosis not present

## 2022-10-06 DIAGNOSIS — J849 Interstitial pulmonary disease, unspecified: Secondary | ICD-10-CM | POA: Diagnosis not present

## 2022-10-06 DIAGNOSIS — I5032 Chronic diastolic (congestive) heart failure: Secondary | ICD-10-CM | POA: Diagnosis not present

## 2022-10-06 DIAGNOSIS — I110 Hypertensive heart disease with heart failure: Secondary | ICD-10-CM | POA: Diagnosis not present

## 2022-10-06 DIAGNOSIS — Z8719 Personal history of other diseases of the digestive system: Secondary | ICD-10-CM | POA: Diagnosis not present

## 2022-10-07 DIAGNOSIS — J849 Interstitial pulmonary disease, unspecified: Secondary | ICD-10-CM | POA: Diagnosis not present

## 2022-10-07 DIAGNOSIS — K220 Achalasia of cardia: Secondary | ICD-10-CM | POA: Diagnosis not present

## 2022-10-07 DIAGNOSIS — I4821 Permanent atrial fibrillation: Secondary | ICD-10-CM | POA: Diagnosis not present

## 2022-10-07 DIAGNOSIS — Z8719 Personal history of other diseases of the digestive system: Secondary | ICD-10-CM | POA: Diagnosis not present

## 2022-10-07 DIAGNOSIS — I5032 Chronic diastolic (congestive) heart failure: Secondary | ICD-10-CM | POA: Diagnosis not present

## 2022-10-07 DIAGNOSIS — I110 Hypertensive heart disease with heart failure: Secondary | ICD-10-CM | POA: Diagnosis not present

## 2022-10-08 DIAGNOSIS — I4821 Permanent atrial fibrillation: Secondary | ICD-10-CM | POA: Diagnosis not present

## 2022-10-08 DIAGNOSIS — I5032 Chronic diastolic (congestive) heart failure: Secondary | ICD-10-CM | POA: Diagnosis not present

## 2022-10-08 DIAGNOSIS — Z8719 Personal history of other diseases of the digestive system: Secondary | ICD-10-CM | POA: Diagnosis not present

## 2022-10-08 DIAGNOSIS — I110 Hypertensive heart disease with heart failure: Secondary | ICD-10-CM | POA: Diagnosis not present

## 2022-10-08 DIAGNOSIS — J849 Interstitial pulmonary disease, unspecified: Secondary | ICD-10-CM | POA: Diagnosis not present

## 2022-10-08 DIAGNOSIS — K220 Achalasia of cardia: Secondary | ICD-10-CM | POA: Diagnosis not present

## 2022-10-09 DIAGNOSIS — Z8719 Personal history of other diseases of the digestive system: Secondary | ICD-10-CM | POA: Diagnosis not present

## 2022-10-09 DIAGNOSIS — J849 Interstitial pulmonary disease, unspecified: Secondary | ICD-10-CM | POA: Diagnosis not present

## 2022-10-09 DIAGNOSIS — I4821 Permanent atrial fibrillation: Secondary | ICD-10-CM | POA: Diagnosis not present

## 2022-10-09 DIAGNOSIS — I5032 Chronic diastolic (congestive) heart failure: Secondary | ICD-10-CM | POA: Diagnosis not present

## 2022-10-09 DIAGNOSIS — K220 Achalasia of cardia: Secondary | ICD-10-CM | POA: Diagnosis not present

## 2022-10-09 DIAGNOSIS — I110 Hypertensive heart disease with heart failure: Secondary | ICD-10-CM | POA: Diagnosis not present

## 2022-10-11 DIAGNOSIS — I110 Hypertensive heart disease with heart failure: Secondary | ICD-10-CM | POA: Diagnosis not present

## 2022-10-11 DIAGNOSIS — K220 Achalasia of cardia: Secondary | ICD-10-CM | POA: Diagnosis not present

## 2022-10-11 DIAGNOSIS — I5032 Chronic diastolic (congestive) heart failure: Secondary | ICD-10-CM | POA: Diagnosis not present

## 2022-10-11 DIAGNOSIS — J849 Interstitial pulmonary disease, unspecified: Secondary | ICD-10-CM | POA: Diagnosis not present

## 2022-10-11 DIAGNOSIS — I4821 Permanent atrial fibrillation: Secondary | ICD-10-CM | POA: Diagnosis not present

## 2022-10-11 DIAGNOSIS — Z8719 Personal history of other diseases of the digestive system: Secondary | ICD-10-CM | POA: Diagnosis not present

## 2022-10-12 DIAGNOSIS — J849 Interstitial pulmonary disease, unspecified: Secondary | ICD-10-CM | POA: Diagnosis not present

## 2022-10-12 DIAGNOSIS — I110 Hypertensive heart disease with heart failure: Secondary | ICD-10-CM | POA: Diagnosis not present

## 2022-10-12 DIAGNOSIS — I4821 Permanent atrial fibrillation: Secondary | ICD-10-CM | POA: Diagnosis not present

## 2022-10-12 DIAGNOSIS — I5032 Chronic diastolic (congestive) heart failure: Secondary | ICD-10-CM | POA: Diagnosis not present

## 2022-10-12 DIAGNOSIS — K220 Achalasia of cardia: Secondary | ICD-10-CM | POA: Diagnosis not present

## 2022-10-12 DIAGNOSIS — Z8719 Personal history of other diseases of the digestive system: Secondary | ICD-10-CM | POA: Diagnosis not present

## 2022-10-15 DIAGNOSIS — K220 Achalasia of cardia: Secondary | ICD-10-CM | POA: Diagnosis not present

## 2022-10-15 DIAGNOSIS — J849 Interstitial pulmonary disease, unspecified: Secondary | ICD-10-CM | POA: Diagnosis not present

## 2022-10-15 DIAGNOSIS — I4821 Permanent atrial fibrillation: Secondary | ICD-10-CM | POA: Diagnosis not present

## 2022-10-15 DIAGNOSIS — Z8719 Personal history of other diseases of the digestive system: Secondary | ICD-10-CM | POA: Diagnosis not present

## 2022-10-15 DIAGNOSIS — I5032 Chronic diastolic (congestive) heart failure: Secondary | ICD-10-CM | POA: Diagnosis not present

## 2022-10-15 DIAGNOSIS — I110 Hypertensive heart disease with heart failure: Secondary | ICD-10-CM | POA: Diagnosis not present

## 2022-10-16 DIAGNOSIS — I110 Hypertensive heart disease with heart failure: Secondary | ICD-10-CM | POA: Diagnosis not present

## 2022-10-16 DIAGNOSIS — I5032 Chronic diastolic (congestive) heart failure: Secondary | ICD-10-CM | POA: Diagnosis not present

## 2022-10-16 DIAGNOSIS — Z8719 Personal history of other diseases of the digestive system: Secondary | ICD-10-CM | POA: Diagnosis not present

## 2022-10-16 DIAGNOSIS — J849 Interstitial pulmonary disease, unspecified: Secondary | ICD-10-CM | POA: Diagnosis not present

## 2022-10-16 DIAGNOSIS — I4821 Permanent atrial fibrillation: Secondary | ICD-10-CM | POA: Diagnosis not present

## 2022-10-16 DIAGNOSIS — K220 Achalasia of cardia: Secondary | ICD-10-CM | POA: Diagnosis not present

## 2022-10-18 DIAGNOSIS — I5032 Chronic diastolic (congestive) heart failure: Secondary | ICD-10-CM | POA: Diagnosis not present

## 2022-10-18 DIAGNOSIS — I4821 Permanent atrial fibrillation: Secondary | ICD-10-CM | POA: Diagnosis not present

## 2022-10-18 DIAGNOSIS — Z8719 Personal history of other diseases of the digestive system: Secondary | ICD-10-CM | POA: Diagnosis not present

## 2022-10-18 DIAGNOSIS — I110 Hypertensive heart disease with heart failure: Secondary | ICD-10-CM | POA: Diagnosis not present

## 2022-10-18 DIAGNOSIS — K220 Achalasia of cardia: Secondary | ICD-10-CM | POA: Diagnosis not present

## 2022-10-18 DIAGNOSIS — J849 Interstitial pulmonary disease, unspecified: Secondary | ICD-10-CM | POA: Diagnosis not present

## 2022-10-22 DIAGNOSIS — J849 Interstitial pulmonary disease, unspecified: Secondary | ICD-10-CM | POA: Diagnosis not present

## 2022-10-22 DIAGNOSIS — I110 Hypertensive heart disease with heart failure: Secondary | ICD-10-CM | POA: Diagnosis not present

## 2022-10-22 DIAGNOSIS — Z8719 Personal history of other diseases of the digestive system: Secondary | ICD-10-CM | POA: Diagnosis not present

## 2022-10-22 DIAGNOSIS — I4821 Permanent atrial fibrillation: Secondary | ICD-10-CM | POA: Diagnosis not present

## 2022-10-22 DIAGNOSIS — I5032 Chronic diastolic (congestive) heart failure: Secondary | ICD-10-CM | POA: Diagnosis not present

## 2022-10-22 DIAGNOSIS — K220 Achalasia of cardia: Secondary | ICD-10-CM | POA: Diagnosis not present

## 2022-10-23 DIAGNOSIS — Z8719 Personal history of other diseases of the digestive system: Secondary | ICD-10-CM | POA: Diagnosis not present

## 2022-10-23 DIAGNOSIS — J849 Interstitial pulmonary disease, unspecified: Secondary | ICD-10-CM | POA: Diagnosis not present

## 2022-10-23 DIAGNOSIS — I110 Hypertensive heart disease with heart failure: Secondary | ICD-10-CM | POA: Diagnosis not present

## 2022-10-23 DIAGNOSIS — I4821 Permanent atrial fibrillation: Secondary | ICD-10-CM | POA: Diagnosis not present

## 2022-10-23 DIAGNOSIS — K220 Achalasia of cardia: Secondary | ICD-10-CM | POA: Diagnosis not present

## 2022-10-23 DIAGNOSIS — I5032 Chronic diastolic (congestive) heart failure: Secondary | ICD-10-CM | POA: Diagnosis not present

## 2022-10-25 DIAGNOSIS — J849 Interstitial pulmonary disease, unspecified: Secondary | ICD-10-CM | POA: Diagnosis not present

## 2022-10-25 DIAGNOSIS — I4821 Permanent atrial fibrillation: Secondary | ICD-10-CM | POA: Diagnosis not present

## 2022-10-25 DIAGNOSIS — I5032 Chronic diastolic (congestive) heart failure: Secondary | ICD-10-CM | POA: Diagnosis not present

## 2022-10-25 DIAGNOSIS — I110 Hypertensive heart disease with heart failure: Secondary | ICD-10-CM | POA: Diagnosis not present

## 2022-10-25 DIAGNOSIS — K220 Achalasia of cardia: Secondary | ICD-10-CM | POA: Diagnosis not present

## 2022-10-25 DIAGNOSIS — Z8719 Personal history of other diseases of the digestive system: Secondary | ICD-10-CM | POA: Diagnosis not present

## 2022-10-26 DIAGNOSIS — K220 Achalasia of cardia: Secondary | ICD-10-CM | POA: Diagnosis not present

## 2022-10-26 DIAGNOSIS — Z8719 Personal history of other diseases of the digestive system: Secondary | ICD-10-CM | POA: Diagnosis not present

## 2022-10-26 DIAGNOSIS — I110 Hypertensive heart disease with heart failure: Secondary | ICD-10-CM | POA: Diagnosis not present

## 2022-10-26 DIAGNOSIS — J849 Interstitial pulmonary disease, unspecified: Secondary | ICD-10-CM | POA: Diagnosis not present

## 2022-10-26 DIAGNOSIS — I5032 Chronic diastolic (congestive) heart failure: Secondary | ICD-10-CM | POA: Diagnosis not present

## 2022-10-26 DIAGNOSIS — I4821 Permanent atrial fibrillation: Secondary | ICD-10-CM | POA: Diagnosis not present

## 2022-10-29 DIAGNOSIS — I4821 Permanent atrial fibrillation: Secondary | ICD-10-CM | POA: Diagnosis not present

## 2022-10-29 DIAGNOSIS — I110 Hypertensive heart disease with heart failure: Secondary | ICD-10-CM | POA: Diagnosis not present

## 2022-10-29 DIAGNOSIS — I5032 Chronic diastolic (congestive) heart failure: Secondary | ICD-10-CM | POA: Diagnosis not present

## 2022-10-29 DIAGNOSIS — K220 Achalasia of cardia: Secondary | ICD-10-CM | POA: Diagnosis not present

## 2022-10-29 DIAGNOSIS — J849 Interstitial pulmonary disease, unspecified: Secondary | ICD-10-CM | POA: Diagnosis not present

## 2022-10-29 DIAGNOSIS — Z8719 Personal history of other diseases of the digestive system: Secondary | ICD-10-CM | POA: Diagnosis not present

## 2022-10-30 DIAGNOSIS — I110 Hypertensive heart disease with heart failure: Secondary | ICD-10-CM | POA: Diagnosis not present

## 2022-10-30 DIAGNOSIS — J849 Interstitial pulmonary disease, unspecified: Secondary | ICD-10-CM | POA: Diagnosis not present

## 2022-10-30 DIAGNOSIS — I4821 Permanent atrial fibrillation: Secondary | ICD-10-CM | POA: Diagnosis not present

## 2022-10-30 DIAGNOSIS — I5032 Chronic diastolic (congestive) heart failure: Secondary | ICD-10-CM | POA: Diagnosis not present

## 2022-10-30 DIAGNOSIS — Z8719 Personal history of other diseases of the digestive system: Secondary | ICD-10-CM | POA: Diagnosis not present

## 2022-10-30 DIAGNOSIS — K220 Achalasia of cardia: Secondary | ICD-10-CM | POA: Diagnosis not present

## 2022-10-31 DIAGNOSIS — Z8719 Personal history of other diseases of the digestive system: Secondary | ICD-10-CM | POA: Diagnosis not present

## 2022-10-31 DIAGNOSIS — Z9049 Acquired absence of other specified parts of digestive tract: Secondary | ICD-10-CM | POA: Diagnosis not present

## 2022-10-31 DIAGNOSIS — R634 Abnormal weight loss: Secondary | ICD-10-CM | POA: Diagnosis not present

## 2022-10-31 DIAGNOSIS — E039 Hypothyroidism, unspecified: Secondary | ICD-10-CM | POA: Diagnosis not present

## 2022-10-31 DIAGNOSIS — K220 Achalasia of cardia: Secondary | ICD-10-CM | POA: Diagnosis not present

## 2022-10-31 DIAGNOSIS — I5032 Chronic diastolic (congestive) heart failure: Secondary | ICD-10-CM | POA: Diagnosis not present

## 2022-10-31 DIAGNOSIS — I110 Hypertensive heart disease with heart failure: Secondary | ICD-10-CM | POA: Diagnosis not present

## 2022-10-31 DIAGNOSIS — D649 Anemia, unspecified: Secondary | ICD-10-CM | POA: Diagnosis not present

## 2022-10-31 DIAGNOSIS — R42 Dizziness and giddiness: Secondary | ICD-10-CM | POA: Diagnosis not present

## 2022-10-31 DIAGNOSIS — Z8781 Personal history of (healed) traumatic fracture: Secondary | ICD-10-CM | POA: Diagnosis not present

## 2022-10-31 DIAGNOSIS — K219 Gastro-esophageal reflux disease without esophagitis: Secondary | ICD-10-CM | POA: Diagnosis not present

## 2022-10-31 DIAGNOSIS — I4821 Permanent atrial fibrillation: Secondary | ICD-10-CM | POA: Diagnosis not present

## 2022-10-31 DIAGNOSIS — G629 Polyneuropathy, unspecified: Secondary | ICD-10-CM | POA: Diagnosis not present

## 2022-10-31 DIAGNOSIS — L309 Dermatitis, unspecified: Secondary | ICD-10-CM | POA: Diagnosis not present

## 2022-10-31 DIAGNOSIS — J849 Interstitial pulmonary disease, unspecified: Secondary | ICD-10-CM | POA: Diagnosis not present

## 2022-11-01 DIAGNOSIS — J849 Interstitial pulmonary disease, unspecified: Secondary | ICD-10-CM | POA: Diagnosis not present

## 2022-11-01 DIAGNOSIS — I110 Hypertensive heart disease with heart failure: Secondary | ICD-10-CM | POA: Diagnosis not present

## 2022-11-01 DIAGNOSIS — I5032 Chronic diastolic (congestive) heart failure: Secondary | ICD-10-CM | POA: Diagnosis not present

## 2022-11-01 DIAGNOSIS — Z8719 Personal history of other diseases of the digestive system: Secondary | ICD-10-CM | POA: Diagnosis not present

## 2022-11-01 DIAGNOSIS — K220 Achalasia of cardia: Secondary | ICD-10-CM | POA: Diagnosis not present

## 2022-11-01 DIAGNOSIS — I4821 Permanent atrial fibrillation: Secondary | ICD-10-CM | POA: Diagnosis not present

## 2022-11-05 DIAGNOSIS — Z8719 Personal history of other diseases of the digestive system: Secondary | ICD-10-CM | POA: Diagnosis not present

## 2022-11-05 DIAGNOSIS — I110 Hypertensive heart disease with heart failure: Secondary | ICD-10-CM | POA: Diagnosis not present

## 2022-11-05 DIAGNOSIS — I5032 Chronic diastolic (congestive) heart failure: Secondary | ICD-10-CM | POA: Diagnosis not present

## 2022-11-05 DIAGNOSIS — J849 Interstitial pulmonary disease, unspecified: Secondary | ICD-10-CM | POA: Diagnosis not present

## 2022-11-05 DIAGNOSIS — I4821 Permanent atrial fibrillation: Secondary | ICD-10-CM | POA: Diagnosis not present

## 2022-11-05 DIAGNOSIS — K220 Achalasia of cardia: Secondary | ICD-10-CM | POA: Diagnosis not present

## 2022-11-06 DIAGNOSIS — K220 Achalasia of cardia: Secondary | ICD-10-CM | POA: Diagnosis not present

## 2022-11-06 DIAGNOSIS — Z8719 Personal history of other diseases of the digestive system: Secondary | ICD-10-CM | POA: Diagnosis not present

## 2022-11-06 DIAGNOSIS — I5032 Chronic diastolic (congestive) heart failure: Secondary | ICD-10-CM | POA: Diagnosis not present

## 2022-11-06 DIAGNOSIS — I4821 Permanent atrial fibrillation: Secondary | ICD-10-CM | POA: Diagnosis not present

## 2022-11-06 DIAGNOSIS — J849 Interstitial pulmonary disease, unspecified: Secondary | ICD-10-CM | POA: Diagnosis not present

## 2022-11-06 DIAGNOSIS — I110 Hypertensive heart disease with heart failure: Secondary | ICD-10-CM | POA: Diagnosis not present

## 2022-11-08 DIAGNOSIS — I4821 Permanent atrial fibrillation: Secondary | ICD-10-CM | POA: Diagnosis not present

## 2022-11-08 DIAGNOSIS — K220 Achalasia of cardia: Secondary | ICD-10-CM | POA: Diagnosis not present

## 2022-11-08 DIAGNOSIS — I5032 Chronic diastolic (congestive) heart failure: Secondary | ICD-10-CM | POA: Diagnosis not present

## 2022-11-08 DIAGNOSIS — Z8719 Personal history of other diseases of the digestive system: Secondary | ICD-10-CM | POA: Diagnosis not present

## 2022-11-08 DIAGNOSIS — J849 Interstitial pulmonary disease, unspecified: Secondary | ICD-10-CM | POA: Diagnosis not present

## 2022-11-08 DIAGNOSIS — I110 Hypertensive heart disease with heart failure: Secondary | ICD-10-CM | POA: Diagnosis not present

## 2022-11-09 DIAGNOSIS — J849 Interstitial pulmonary disease, unspecified: Secondary | ICD-10-CM | POA: Diagnosis not present

## 2022-11-09 DIAGNOSIS — K220 Achalasia of cardia: Secondary | ICD-10-CM | POA: Diagnosis not present

## 2022-11-09 DIAGNOSIS — I110 Hypertensive heart disease with heart failure: Secondary | ICD-10-CM | POA: Diagnosis not present

## 2022-11-09 DIAGNOSIS — I4821 Permanent atrial fibrillation: Secondary | ICD-10-CM | POA: Diagnosis not present

## 2022-11-09 DIAGNOSIS — I5032 Chronic diastolic (congestive) heart failure: Secondary | ICD-10-CM | POA: Diagnosis not present

## 2022-11-09 DIAGNOSIS — Z8719 Personal history of other diseases of the digestive system: Secondary | ICD-10-CM | POA: Diagnosis not present

## 2022-11-12 DIAGNOSIS — Z8719 Personal history of other diseases of the digestive system: Secondary | ICD-10-CM | POA: Diagnosis not present

## 2022-11-12 DIAGNOSIS — I110 Hypertensive heart disease with heart failure: Secondary | ICD-10-CM | POA: Diagnosis not present

## 2022-11-12 DIAGNOSIS — K220 Achalasia of cardia: Secondary | ICD-10-CM | POA: Diagnosis not present

## 2022-11-12 DIAGNOSIS — J849 Interstitial pulmonary disease, unspecified: Secondary | ICD-10-CM | POA: Diagnosis not present

## 2022-11-12 DIAGNOSIS — I4821 Permanent atrial fibrillation: Secondary | ICD-10-CM | POA: Diagnosis not present

## 2022-11-12 DIAGNOSIS — I5032 Chronic diastolic (congestive) heart failure: Secondary | ICD-10-CM | POA: Diagnosis not present

## 2022-11-13 ENCOUNTER — Telehealth: Payer: Self-pay | Admitting: Family Medicine

## 2022-11-13 DIAGNOSIS — K220 Achalasia of cardia: Secondary | ICD-10-CM | POA: Diagnosis not present

## 2022-11-13 DIAGNOSIS — I110 Hypertensive heart disease with heart failure: Secondary | ICD-10-CM | POA: Diagnosis not present

## 2022-11-13 DIAGNOSIS — J849 Interstitial pulmonary disease, unspecified: Secondary | ICD-10-CM | POA: Diagnosis not present

## 2022-11-13 DIAGNOSIS — Z8719 Personal history of other diseases of the digestive system: Secondary | ICD-10-CM | POA: Diagnosis not present

## 2022-11-13 DIAGNOSIS — I4821 Permanent atrial fibrillation: Secondary | ICD-10-CM | POA: Diagnosis not present

## 2022-11-13 DIAGNOSIS — I5032 Chronic diastolic (congestive) heart failure: Secondary | ICD-10-CM | POA: Diagnosis not present

## 2022-11-13 NOTE — Telephone Encounter (Signed)
Copied from CRM [id number]. Topic: Medicare AWV >> Nov 13, 2022 10:31 AM Gwenith Spitz wrote: Reason for CRM: Called patient to schedule Medicare Annual Wellness Visit (AWV). Left message for patient to call back and schedule Medicare Annual Wellness Visit (AWV).  Last date of AWV: N/A  Please schedule an appointment at any time with Inetta Fermo, [facility]. Please schedule AWVI with Inetta Fermo, [facility].  If any questions, please contact me at [phone].  Thank you ,  Gabriel Cirri [facility] AWV TEAM Direct Dial [phone]

## 2022-11-15 DIAGNOSIS — I5032 Chronic diastolic (congestive) heart failure: Secondary | ICD-10-CM | POA: Diagnosis not present

## 2022-11-15 DIAGNOSIS — J849 Interstitial pulmonary disease, unspecified: Secondary | ICD-10-CM | POA: Diagnosis not present

## 2022-11-15 DIAGNOSIS — I4821 Permanent atrial fibrillation: Secondary | ICD-10-CM | POA: Diagnosis not present

## 2022-11-15 DIAGNOSIS — K220 Achalasia of cardia: Secondary | ICD-10-CM | POA: Diagnosis not present

## 2022-11-15 DIAGNOSIS — I110 Hypertensive heart disease with heart failure: Secondary | ICD-10-CM | POA: Diagnosis not present

## 2022-11-15 DIAGNOSIS — Z8719 Personal history of other diseases of the digestive system: Secondary | ICD-10-CM | POA: Diagnosis not present

## 2022-11-16 DIAGNOSIS — I4821 Permanent atrial fibrillation: Secondary | ICD-10-CM | POA: Diagnosis not present

## 2022-11-16 DIAGNOSIS — Z8719 Personal history of other diseases of the digestive system: Secondary | ICD-10-CM | POA: Diagnosis not present

## 2022-11-16 DIAGNOSIS — I5032 Chronic diastolic (congestive) heart failure: Secondary | ICD-10-CM | POA: Diagnosis not present

## 2022-11-16 DIAGNOSIS — I110 Hypertensive heart disease with heart failure: Secondary | ICD-10-CM | POA: Diagnosis not present

## 2022-11-16 DIAGNOSIS — J849 Interstitial pulmonary disease, unspecified: Secondary | ICD-10-CM | POA: Diagnosis not present

## 2022-11-16 DIAGNOSIS — K220 Achalasia of cardia: Secondary | ICD-10-CM | POA: Diagnosis not present

## 2022-11-19 DIAGNOSIS — J849 Interstitial pulmonary disease, unspecified: Secondary | ICD-10-CM | POA: Diagnosis not present

## 2022-11-19 DIAGNOSIS — I5032 Chronic diastolic (congestive) heart failure: Secondary | ICD-10-CM | POA: Diagnosis not present

## 2022-11-19 DIAGNOSIS — Z8719 Personal history of other diseases of the digestive system: Secondary | ICD-10-CM | POA: Diagnosis not present

## 2022-11-19 DIAGNOSIS — I4821 Permanent atrial fibrillation: Secondary | ICD-10-CM | POA: Diagnosis not present

## 2022-11-19 DIAGNOSIS — I110 Hypertensive heart disease with heart failure: Secondary | ICD-10-CM | POA: Diagnosis not present

## 2022-11-19 DIAGNOSIS — K220 Achalasia of cardia: Secondary | ICD-10-CM | POA: Diagnosis not present

## 2022-11-20 DIAGNOSIS — J849 Interstitial pulmonary disease, unspecified: Secondary | ICD-10-CM | POA: Diagnosis not present

## 2022-11-20 DIAGNOSIS — Z8719 Personal history of other diseases of the digestive system: Secondary | ICD-10-CM | POA: Diagnosis not present

## 2022-11-20 DIAGNOSIS — I110 Hypertensive heart disease with heart failure: Secondary | ICD-10-CM | POA: Diagnosis not present

## 2022-11-20 DIAGNOSIS — I5032 Chronic diastolic (congestive) heart failure: Secondary | ICD-10-CM | POA: Diagnosis not present

## 2022-11-20 DIAGNOSIS — I4821 Permanent atrial fibrillation: Secondary | ICD-10-CM | POA: Diagnosis not present

## 2022-11-20 DIAGNOSIS — K220 Achalasia of cardia: Secondary | ICD-10-CM | POA: Diagnosis not present

## 2022-11-22 DIAGNOSIS — J849 Interstitial pulmonary disease, unspecified: Secondary | ICD-10-CM | POA: Diagnosis not present

## 2022-11-22 DIAGNOSIS — Z8719 Personal history of other diseases of the digestive system: Secondary | ICD-10-CM | POA: Diagnosis not present

## 2022-11-22 DIAGNOSIS — I4821 Permanent atrial fibrillation: Secondary | ICD-10-CM | POA: Diagnosis not present

## 2022-11-22 DIAGNOSIS — I110 Hypertensive heart disease with heart failure: Secondary | ICD-10-CM | POA: Diagnosis not present

## 2022-11-22 DIAGNOSIS — I5032 Chronic diastolic (congestive) heart failure: Secondary | ICD-10-CM | POA: Diagnosis not present

## 2022-11-22 DIAGNOSIS — K220 Achalasia of cardia: Secondary | ICD-10-CM | POA: Diagnosis not present

## 2022-11-23 DIAGNOSIS — I5032 Chronic diastolic (congestive) heart failure: Secondary | ICD-10-CM | POA: Diagnosis not present

## 2022-11-23 DIAGNOSIS — Z8719 Personal history of other diseases of the digestive system: Secondary | ICD-10-CM | POA: Diagnosis not present

## 2022-11-23 DIAGNOSIS — J849 Interstitial pulmonary disease, unspecified: Secondary | ICD-10-CM | POA: Diagnosis not present

## 2022-11-23 DIAGNOSIS — I4821 Permanent atrial fibrillation: Secondary | ICD-10-CM | POA: Diagnosis not present

## 2022-11-23 DIAGNOSIS — K220 Achalasia of cardia: Secondary | ICD-10-CM | POA: Diagnosis not present

## 2022-11-23 DIAGNOSIS — I110 Hypertensive heart disease with heart failure: Secondary | ICD-10-CM | POA: Diagnosis not present

## 2022-11-26 DIAGNOSIS — I4821 Permanent atrial fibrillation: Secondary | ICD-10-CM | POA: Diagnosis not present

## 2022-11-26 DIAGNOSIS — J849 Interstitial pulmonary disease, unspecified: Secondary | ICD-10-CM | POA: Diagnosis not present

## 2022-11-26 DIAGNOSIS — I5032 Chronic diastolic (congestive) heart failure: Secondary | ICD-10-CM | POA: Diagnosis not present

## 2022-11-26 DIAGNOSIS — Z8719 Personal history of other diseases of the digestive system: Secondary | ICD-10-CM | POA: Diagnosis not present

## 2022-11-26 DIAGNOSIS — I110 Hypertensive heart disease with heart failure: Secondary | ICD-10-CM | POA: Diagnosis not present

## 2022-11-26 DIAGNOSIS — K220 Achalasia of cardia: Secondary | ICD-10-CM | POA: Diagnosis not present

## 2022-11-27 DIAGNOSIS — I5032 Chronic diastolic (congestive) heart failure: Secondary | ICD-10-CM | POA: Diagnosis not present

## 2022-11-27 DIAGNOSIS — I110 Hypertensive heart disease with heart failure: Secondary | ICD-10-CM | POA: Diagnosis not present

## 2022-11-27 DIAGNOSIS — J849 Interstitial pulmonary disease, unspecified: Secondary | ICD-10-CM | POA: Diagnosis not present

## 2022-11-27 DIAGNOSIS — Z8719 Personal history of other diseases of the digestive system: Secondary | ICD-10-CM | POA: Diagnosis not present

## 2022-11-27 DIAGNOSIS — K220 Achalasia of cardia: Secondary | ICD-10-CM | POA: Diagnosis not present

## 2022-11-27 DIAGNOSIS — I4821 Permanent atrial fibrillation: Secondary | ICD-10-CM | POA: Diagnosis not present

## 2022-11-28 DIAGNOSIS — I5032 Chronic diastolic (congestive) heart failure: Secondary | ICD-10-CM | POA: Diagnosis not present

## 2022-11-28 DIAGNOSIS — Z8719 Personal history of other diseases of the digestive system: Secondary | ICD-10-CM | POA: Diagnosis not present

## 2022-11-28 DIAGNOSIS — I110 Hypertensive heart disease with heart failure: Secondary | ICD-10-CM | POA: Diagnosis not present

## 2022-11-28 DIAGNOSIS — K220 Achalasia of cardia: Secondary | ICD-10-CM | POA: Diagnosis not present

## 2022-11-28 DIAGNOSIS — I4821 Permanent atrial fibrillation: Secondary | ICD-10-CM | POA: Diagnosis not present

## 2022-11-28 DIAGNOSIS — J849 Interstitial pulmonary disease, unspecified: Secondary | ICD-10-CM | POA: Diagnosis not present

## 2022-11-29 DIAGNOSIS — J849 Interstitial pulmonary disease, unspecified: Secondary | ICD-10-CM | POA: Diagnosis not present

## 2022-11-29 DIAGNOSIS — Z8719 Personal history of other diseases of the digestive system: Secondary | ICD-10-CM | POA: Diagnosis not present

## 2022-11-29 DIAGNOSIS — I5032 Chronic diastolic (congestive) heart failure: Secondary | ICD-10-CM | POA: Diagnosis not present

## 2022-11-29 DIAGNOSIS — I110 Hypertensive heart disease with heart failure: Secondary | ICD-10-CM | POA: Diagnosis not present

## 2022-11-29 DIAGNOSIS — K220 Achalasia of cardia: Secondary | ICD-10-CM | POA: Diagnosis not present

## 2022-11-29 DIAGNOSIS — I4821 Permanent atrial fibrillation: Secondary | ICD-10-CM | POA: Diagnosis not present

## 2022-11-30 DIAGNOSIS — I4821 Permanent atrial fibrillation: Secondary | ICD-10-CM | POA: Diagnosis not present

## 2022-11-30 DIAGNOSIS — I110 Hypertensive heart disease with heart failure: Secondary | ICD-10-CM | POA: Diagnosis not present

## 2022-11-30 DIAGNOSIS — K220 Achalasia of cardia: Secondary | ICD-10-CM | POA: Diagnosis not present

## 2022-11-30 DIAGNOSIS — J849 Interstitial pulmonary disease, unspecified: Secondary | ICD-10-CM | POA: Diagnosis not present

## 2022-11-30 DIAGNOSIS — I5032 Chronic diastolic (congestive) heart failure: Secondary | ICD-10-CM | POA: Diagnosis not present

## 2022-11-30 DIAGNOSIS — Z8719 Personal history of other diseases of the digestive system: Secondary | ICD-10-CM | POA: Diagnosis not present

## 2022-12-01 DIAGNOSIS — R42 Dizziness and giddiness: Secondary | ICD-10-CM | POA: Diagnosis not present

## 2022-12-01 DIAGNOSIS — K219 Gastro-esophageal reflux disease without esophagitis: Secondary | ICD-10-CM | POA: Diagnosis not present

## 2022-12-01 DIAGNOSIS — R634 Abnormal weight loss: Secondary | ICD-10-CM | POA: Diagnosis not present

## 2022-12-01 DIAGNOSIS — J849 Interstitial pulmonary disease, unspecified: Secondary | ICD-10-CM | POA: Diagnosis not present

## 2022-12-01 DIAGNOSIS — K220 Achalasia of cardia: Secondary | ICD-10-CM | POA: Diagnosis not present

## 2022-12-01 DIAGNOSIS — G629 Polyneuropathy, unspecified: Secondary | ICD-10-CM | POA: Diagnosis not present

## 2022-12-01 DIAGNOSIS — E039 Hypothyroidism, unspecified: Secondary | ICD-10-CM | POA: Diagnosis not present

## 2022-12-01 DIAGNOSIS — I4821 Permanent atrial fibrillation: Secondary | ICD-10-CM | POA: Diagnosis not present

## 2022-12-01 DIAGNOSIS — I5032 Chronic diastolic (congestive) heart failure: Secondary | ICD-10-CM | POA: Diagnosis not present

## 2022-12-01 DIAGNOSIS — Z8781 Personal history of (healed) traumatic fracture: Secondary | ICD-10-CM | POA: Diagnosis not present

## 2022-12-01 DIAGNOSIS — Z9049 Acquired absence of other specified parts of digestive tract: Secondary | ICD-10-CM | POA: Diagnosis not present

## 2022-12-01 DIAGNOSIS — D649 Anemia, unspecified: Secondary | ICD-10-CM | POA: Diagnosis not present

## 2022-12-01 DIAGNOSIS — I110 Hypertensive heart disease with heart failure: Secondary | ICD-10-CM | POA: Diagnosis not present

## 2022-12-01 DIAGNOSIS — Z8719 Personal history of other diseases of the digestive system: Secondary | ICD-10-CM | POA: Diagnosis not present

## 2022-12-01 DIAGNOSIS — L309 Dermatitis, unspecified: Secondary | ICD-10-CM | POA: Diagnosis not present

## 2022-12-03 DIAGNOSIS — I110 Hypertensive heart disease with heart failure: Secondary | ICD-10-CM | POA: Diagnosis not present

## 2022-12-03 DIAGNOSIS — J849 Interstitial pulmonary disease, unspecified: Secondary | ICD-10-CM | POA: Diagnosis not present

## 2022-12-03 DIAGNOSIS — I5032 Chronic diastolic (congestive) heart failure: Secondary | ICD-10-CM | POA: Diagnosis not present

## 2022-12-03 DIAGNOSIS — K220 Achalasia of cardia: Secondary | ICD-10-CM | POA: Diagnosis not present

## 2022-12-03 DIAGNOSIS — I4821 Permanent atrial fibrillation: Secondary | ICD-10-CM | POA: Diagnosis not present

## 2022-12-03 DIAGNOSIS — Z8719 Personal history of other diseases of the digestive system: Secondary | ICD-10-CM | POA: Diagnosis not present

## 2022-12-04 DIAGNOSIS — I5032 Chronic diastolic (congestive) heart failure: Secondary | ICD-10-CM | POA: Diagnosis not present

## 2022-12-04 DIAGNOSIS — K220 Achalasia of cardia: Secondary | ICD-10-CM | POA: Diagnosis not present

## 2022-12-04 DIAGNOSIS — I110 Hypertensive heart disease with heart failure: Secondary | ICD-10-CM | POA: Diagnosis not present

## 2022-12-04 DIAGNOSIS — I4821 Permanent atrial fibrillation: Secondary | ICD-10-CM | POA: Diagnosis not present

## 2022-12-04 DIAGNOSIS — J849 Interstitial pulmonary disease, unspecified: Secondary | ICD-10-CM | POA: Diagnosis not present

## 2022-12-04 DIAGNOSIS — Z8719 Personal history of other diseases of the digestive system: Secondary | ICD-10-CM | POA: Diagnosis not present

## 2022-12-06 DIAGNOSIS — J849 Interstitial pulmonary disease, unspecified: Secondary | ICD-10-CM | POA: Diagnosis not present

## 2022-12-06 DIAGNOSIS — I110 Hypertensive heart disease with heart failure: Secondary | ICD-10-CM | POA: Diagnosis not present

## 2022-12-06 DIAGNOSIS — I4821 Permanent atrial fibrillation: Secondary | ICD-10-CM | POA: Diagnosis not present

## 2022-12-06 DIAGNOSIS — K220 Achalasia of cardia: Secondary | ICD-10-CM | POA: Diagnosis not present

## 2022-12-06 DIAGNOSIS — I5032 Chronic diastolic (congestive) heart failure: Secondary | ICD-10-CM | POA: Diagnosis not present

## 2022-12-06 DIAGNOSIS — Z8719 Personal history of other diseases of the digestive system: Secondary | ICD-10-CM | POA: Diagnosis not present

## 2022-12-07 DIAGNOSIS — J849 Interstitial pulmonary disease, unspecified: Secondary | ICD-10-CM | POA: Diagnosis not present

## 2022-12-07 DIAGNOSIS — I5032 Chronic diastolic (congestive) heart failure: Secondary | ICD-10-CM | POA: Diagnosis not present

## 2022-12-07 DIAGNOSIS — I4821 Permanent atrial fibrillation: Secondary | ICD-10-CM | POA: Diagnosis not present

## 2022-12-07 DIAGNOSIS — Z8719 Personal history of other diseases of the digestive system: Secondary | ICD-10-CM | POA: Diagnosis not present

## 2022-12-07 DIAGNOSIS — I110 Hypertensive heart disease with heart failure: Secondary | ICD-10-CM | POA: Diagnosis not present

## 2022-12-07 DIAGNOSIS — K220 Achalasia of cardia: Secondary | ICD-10-CM | POA: Diagnosis not present

## 2022-12-10 DIAGNOSIS — I110 Hypertensive heart disease with heart failure: Secondary | ICD-10-CM | POA: Diagnosis not present

## 2022-12-10 DIAGNOSIS — J849 Interstitial pulmonary disease, unspecified: Secondary | ICD-10-CM | POA: Diagnosis not present

## 2022-12-10 DIAGNOSIS — I4821 Permanent atrial fibrillation: Secondary | ICD-10-CM | POA: Diagnosis not present

## 2022-12-10 DIAGNOSIS — I5032 Chronic diastolic (congestive) heart failure: Secondary | ICD-10-CM | POA: Diagnosis not present

## 2022-12-10 DIAGNOSIS — Z8719 Personal history of other diseases of the digestive system: Secondary | ICD-10-CM | POA: Diagnosis not present

## 2022-12-10 DIAGNOSIS — K220 Achalasia of cardia: Secondary | ICD-10-CM | POA: Diagnosis not present

## 2022-12-11 DIAGNOSIS — Z8719 Personal history of other diseases of the digestive system: Secondary | ICD-10-CM | POA: Diagnosis not present

## 2022-12-11 DIAGNOSIS — I110 Hypertensive heart disease with heart failure: Secondary | ICD-10-CM | POA: Diagnosis not present

## 2022-12-11 DIAGNOSIS — I5032 Chronic diastolic (congestive) heart failure: Secondary | ICD-10-CM | POA: Diagnosis not present

## 2022-12-11 DIAGNOSIS — I4821 Permanent atrial fibrillation: Secondary | ICD-10-CM | POA: Diagnosis not present

## 2022-12-11 DIAGNOSIS — K220 Achalasia of cardia: Secondary | ICD-10-CM | POA: Diagnosis not present

## 2022-12-11 DIAGNOSIS — J849 Interstitial pulmonary disease, unspecified: Secondary | ICD-10-CM | POA: Diagnosis not present

## 2022-12-13 DIAGNOSIS — K220 Achalasia of cardia: Secondary | ICD-10-CM | POA: Diagnosis not present

## 2022-12-13 DIAGNOSIS — I110 Hypertensive heart disease with heart failure: Secondary | ICD-10-CM | POA: Diagnosis not present

## 2022-12-13 DIAGNOSIS — Z8719 Personal history of other diseases of the digestive system: Secondary | ICD-10-CM | POA: Diagnosis not present

## 2022-12-13 DIAGNOSIS — J849 Interstitial pulmonary disease, unspecified: Secondary | ICD-10-CM | POA: Diagnosis not present

## 2022-12-13 DIAGNOSIS — I5032 Chronic diastolic (congestive) heart failure: Secondary | ICD-10-CM | POA: Diagnosis not present

## 2022-12-13 DIAGNOSIS — I4821 Permanent atrial fibrillation: Secondary | ICD-10-CM | POA: Diagnosis not present

## 2022-12-14 DIAGNOSIS — I110 Hypertensive heart disease with heart failure: Secondary | ICD-10-CM | POA: Diagnosis not present

## 2022-12-14 DIAGNOSIS — J849 Interstitial pulmonary disease, unspecified: Secondary | ICD-10-CM | POA: Diagnosis not present

## 2022-12-14 DIAGNOSIS — K220 Achalasia of cardia: Secondary | ICD-10-CM | POA: Diagnosis not present

## 2022-12-14 DIAGNOSIS — I4821 Permanent atrial fibrillation: Secondary | ICD-10-CM | POA: Diagnosis not present

## 2022-12-14 DIAGNOSIS — I5032 Chronic diastolic (congestive) heart failure: Secondary | ICD-10-CM | POA: Diagnosis not present

## 2022-12-14 DIAGNOSIS — Z8719 Personal history of other diseases of the digestive system: Secondary | ICD-10-CM | POA: Diagnosis not present

## 2022-12-17 DIAGNOSIS — J849 Interstitial pulmonary disease, unspecified: Secondary | ICD-10-CM | POA: Diagnosis not present

## 2022-12-17 DIAGNOSIS — K220 Achalasia of cardia: Secondary | ICD-10-CM | POA: Diagnosis not present

## 2022-12-17 DIAGNOSIS — I4821 Permanent atrial fibrillation: Secondary | ICD-10-CM | POA: Diagnosis not present

## 2022-12-17 DIAGNOSIS — I5032 Chronic diastolic (congestive) heart failure: Secondary | ICD-10-CM | POA: Diagnosis not present

## 2022-12-17 DIAGNOSIS — Z8719 Personal history of other diseases of the digestive system: Secondary | ICD-10-CM | POA: Diagnosis not present

## 2022-12-17 DIAGNOSIS — I110 Hypertensive heart disease with heart failure: Secondary | ICD-10-CM | POA: Diagnosis not present

## 2022-12-18 DIAGNOSIS — I4821 Permanent atrial fibrillation: Secondary | ICD-10-CM | POA: Diagnosis not present

## 2022-12-18 DIAGNOSIS — I5032 Chronic diastolic (congestive) heart failure: Secondary | ICD-10-CM | POA: Diagnosis not present

## 2022-12-18 DIAGNOSIS — Z8719 Personal history of other diseases of the digestive system: Secondary | ICD-10-CM | POA: Diagnosis not present

## 2022-12-18 DIAGNOSIS — J849 Interstitial pulmonary disease, unspecified: Secondary | ICD-10-CM | POA: Diagnosis not present

## 2022-12-18 DIAGNOSIS — K220 Achalasia of cardia: Secondary | ICD-10-CM | POA: Diagnosis not present

## 2022-12-18 DIAGNOSIS — I110 Hypertensive heart disease with heart failure: Secondary | ICD-10-CM | POA: Diagnosis not present

## 2022-12-20 DIAGNOSIS — K220 Achalasia of cardia: Secondary | ICD-10-CM | POA: Diagnosis not present

## 2022-12-20 DIAGNOSIS — J849 Interstitial pulmonary disease, unspecified: Secondary | ICD-10-CM | POA: Diagnosis not present

## 2022-12-20 DIAGNOSIS — I110 Hypertensive heart disease with heart failure: Secondary | ICD-10-CM | POA: Diagnosis not present

## 2022-12-20 DIAGNOSIS — Z8719 Personal history of other diseases of the digestive system: Secondary | ICD-10-CM | POA: Diagnosis not present

## 2022-12-20 DIAGNOSIS — I4821 Permanent atrial fibrillation: Secondary | ICD-10-CM | POA: Diagnosis not present

## 2022-12-20 DIAGNOSIS — I5032 Chronic diastolic (congestive) heart failure: Secondary | ICD-10-CM | POA: Diagnosis not present

## 2022-12-21 DIAGNOSIS — I110 Hypertensive heart disease with heart failure: Secondary | ICD-10-CM | POA: Diagnosis not present

## 2022-12-21 DIAGNOSIS — I4821 Permanent atrial fibrillation: Secondary | ICD-10-CM | POA: Diagnosis not present

## 2022-12-21 DIAGNOSIS — J849 Interstitial pulmonary disease, unspecified: Secondary | ICD-10-CM | POA: Diagnosis not present

## 2022-12-21 DIAGNOSIS — I5032 Chronic diastolic (congestive) heart failure: Secondary | ICD-10-CM | POA: Diagnosis not present

## 2022-12-21 DIAGNOSIS — Z8719 Personal history of other diseases of the digestive system: Secondary | ICD-10-CM | POA: Diagnosis not present

## 2022-12-21 DIAGNOSIS — K220 Achalasia of cardia: Secondary | ICD-10-CM | POA: Diagnosis not present

## 2022-12-22 DIAGNOSIS — K220 Achalasia of cardia: Secondary | ICD-10-CM | POA: Diagnosis not present

## 2022-12-22 DIAGNOSIS — I5032 Chronic diastolic (congestive) heart failure: Secondary | ICD-10-CM | POA: Diagnosis not present

## 2022-12-22 DIAGNOSIS — I4821 Permanent atrial fibrillation: Secondary | ICD-10-CM | POA: Diagnosis not present

## 2022-12-22 DIAGNOSIS — J849 Interstitial pulmonary disease, unspecified: Secondary | ICD-10-CM | POA: Diagnosis not present

## 2022-12-22 DIAGNOSIS — Z8719 Personal history of other diseases of the digestive system: Secondary | ICD-10-CM | POA: Diagnosis not present

## 2022-12-22 DIAGNOSIS — I110 Hypertensive heart disease with heart failure: Secondary | ICD-10-CM | POA: Diagnosis not present

## 2022-12-23 DIAGNOSIS — I4821 Permanent atrial fibrillation: Secondary | ICD-10-CM | POA: Diagnosis not present

## 2022-12-23 DIAGNOSIS — J849 Interstitial pulmonary disease, unspecified: Secondary | ICD-10-CM | POA: Diagnosis not present

## 2022-12-23 DIAGNOSIS — Z8719 Personal history of other diseases of the digestive system: Secondary | ICD-10-CM | POA: Diagnosis not present

## 2022-12-23 DIAGNOSIS — K220 Achalasia of cardia: Secondary | ICD-10-CM | POA: Diagnosis not present

## 2022-12-23 DIAGNOSIS — I110 Hypertensive heart disease with heart failure: Secondary | ICD-10-CM | POA: Diagnosis not present

## 2022-12-23 DIAGNOSIS — I5032 Chronic diastolic (congestive) heart failure: Secondary | ICD-10-CM | POA: Diagnosis not present

## 2022-12-24 DIAGNOSIS — I4821 Permanent atrial fibrillation: Secondary | ICD-10-CM | POA: Diagnosis not present

## 2022-12-24 DIAGNOSIS — J849 Interstitial pulmonary disease, unspecified: Secondary | ICD-10-CM | POA: Diagnosis not present

## 2022-12-24 DIAGNOSIS — I5032 Chronic diastolic (congestive) heart failure: Secondary | ICD-10-CM | POA: Diagnosis not present

## 2022-12-24 DIAGNOSIS — I110 Hypertensive heart disease with heart failure: Secondary | ICD-10-CM | POA: Diagnosis not present

## 2022-12-24 DIAGNOSIS — K220 Achalasia of cardia: Secondary | ICD-10-CM | POA: Diagnosis not present

## 2022-12-24 DIAGNOSIS — Z8719 Personal history of other diseases of the digestive system: Secondary | ICD-10-CM | POA: Diagnosis not present

## 2022-12-25 DIAGNOSIS — I4821 Permanent atrial fibrillation: Secondary | ICD-10-CM | POA: Diagnosis not present

## 2022-12-25 DIAGNOSIS — I5032 Chronic diastolic (congestive) heart failure: Secondary | ICD-10-CM | POA: Diagnosis not present

## 2022-12-25 DIAGNOSIS — I110 Hypertensive heart disease with heart failure: Secondary | ICD-10-CM | POA: Diagnosis not present

## 2022-12-25 DIAGNOSIS — K220 Achalasia of cardia: Secondary | ICD-10-CM | POA: Diagnosis not present

## 2022-12-25 DIAGNOSIS — Z8719 Personal history of other diseases of the digestive system: Secondary | ICD-10-CM | POA: Diagnosis not present

## 2022-12-25 DIAGNOSIS — J849 Interstitial pulmonary disease, unspecified: Secondary | ICD-10-CM | POA: Diagnosis not present

## 2022-12-26 DIAGNOSIS — K220 Achalasia of cardia: Secondary | ICD-10-CM | POA: Diagnosis not present

## 2022-12-26 DIAGNOSIS — J849 Interstitial pulmonary disease, unspecified: Secondary | ICD-10-CM | POA: Diagnosis not present

## 2022-12-26 DIAGNOSIS — Z8719 Personal history of other diseases of the digestive system: Secondary | ICD-10-CM | POA: Diagnosis not present

## 2022-12-26 DIAGNOSIS — I110 Hypertensive heart disease with heart failure: Secondary | ICD-10-CM | POA: Diagnosis not present

## 2022-12-26 DIAGNOSIS — I4821 Permanent atrial fibrillation: Secondary | ICD-10-CM | POA: Diagnosis not present

## 2022-12-26 DIAGNOSIS — I5032 Chronic diastolic (congestive) heart failure: Secondary | ICD-10-CM | POA: Diagnosis not present

## 2022-12-27 DIAGNOSIS — Z8719 Personal history of other diseases of the digestive system: Secondary | ICD-10-CM | POA: Diagnosis not present

## 2022-12-27 DIAGNOSIS — K220 Achalasia of cardia: Secondary | ICD-10-CM | POA: Diagnosis not present

## 2022-12-27 DIAGNOSIS — I4821 Permanent atrial fibrillation: Secondary | ICD-10-CM | POA: Diagnosis not present

## 2022-12-27 DIAGNOSIS — I110 Hypertensive heart disease with heart failure: Secondary | ICD-10-CM | POA: Diagnosis not present

## 2022-12-27 DIAGNOSIS — I5032 Chronic diastolic (congestive) heart failure: Secondary | ICD-10-CM | POA: Diagnosis not present

## 2022-12-27 DIAGNOSIS — J849 Interstitial pulmonary disease, unspecified: Secondary | ICD-10-CM | POA: Diagnosis not present

## 2022-12-28 DIAGNOSIS — Z8719 Personal history of other diseases of the digestive system: Secondary | ICD-10-CM | POA: Diagnosis not present

## 2022-12-28 DIAGNOSIS — I5032 Chronic diastolic (congestive) heart failure: Secondary | ICD-10-CM | POA: Diagnosis not present

## 2022-12-28 DIAGNOSIS — I4821 Permanent atrial fibrillation: Secondary | ICD-10-CM | POA: Diagnosis not present

## 2022-12-28 DIAGNOSIS — I110 Hypertensive heart disease with heart failure: Secondary | ICD-10-CM | POA: Diagnosis not present

## 2022-12-28 DIAGNOSIS — J849 Interstitial pulmonary disease, unspecified: Secondary | ICD-10-CM | POA: Diagnosis not present

## 2022-12-28 DIAGNOSIS — K220 Achalasia of cardia: Secondary | ICD-10-CM | POA: Diagnosis not present

## 2022-12-29 DIAGNOSIS — K220 Achalasia of cardia: Secondary | ICD-10-CM | POA: Diagnosis not present

## 2022-12-29 DIAGNOSIS — I110 Hypertensive heart disease with heart failure: Secondary | ICD-10-CM | POA: Diagnosis not present

## 2022-12-29 DIAGNOSIS — I5032 Chronic diastolic (congestive) heart failure: Secondary | ICD-10-CM | POA: Diagnosis not present

## 2022-12-29 DIAGNOSIS — I4821 Permanent atrial fibrillation: Secondary | ICD-10-CM | POA: Diagnosis not present

## 2022-12-29 DIAGNOSIS — Z8719 Personal history of other diseases of the digestive system: Secondary | ICD-10-CM | POA: Diagnosis not present

## 2022-12-29 DIAGNOSIS — J849 Interstitial pulmonary disease, unspecified: Secondary | ICD-10-CM | POA: Diagnosis not present

## 2022-12-30 DIAGNOSIS — I4821 Permanent atrial fibrillation: Secondary | ICD-10-CM | POA: Diagnosis not present

## 2022-12-30 DIAGNOSIS — J849 Interstitial pulmonary disease, unspecified: Secondary | ICD-10-CM | POA: Diagnosis not present

## 2022-12-30 DIAGNOSIS — I110 Hypertensive heart disease with heart failure: Secondary | ICD-10-CM | POA: Diagnosis not present

## 2022-12-30 DIAGNOSIS — K220 Achalasia of cardia: Secondary | ICD-10-CM | POA: Diagnosis not present

## 2022-12-30 DIAGNOSIS — I5032 Chronic diastolic (congestive) heart failure: Secondary | ICD-10-CM | POA: Diagnosis not present

## 2022-12-30 DIAGNOSIS — Z8719 Personal history of other diseases of the digestive system: Secondary | ICD-10-CM | POA: Diagnosis not present

## 2022-12-31 ENCOUNTER — Telehealth: Payer: Self-pay | Admitting: Family Medicine

## 2022-12-31 NOTE — Telephone Encounter (Signed)
FYI

## 2022-12-31 NOTE — Telephone Encounter (Signed)
Hospice Home @ [facility] [phone] Notification of Death:  01-19-23 at 3:35 am.

## 2022-12-31 NOTE — Telephone Encounter (Signed)
Noted-I left message with daughter-thanks for letting me know

## 2022-12-31 DEATH — deceased

## 2023-01-07 ENCOUNTER — Telehealth: Payer: Self-pay | Admitting: Internal Medicine

## 2023-01-07 NOTE — Telephone Encounter (Signed)
Patient's daughter called wanting to thank Dr. Graciela Husbands for the care he provided to her mother, she passed away on 01-02-23.

## 2023-01-07 NOTE — Telephone Encounter (Signed)
PT's daughter called to let you know her mother passed. You were by [phone] her favorite Dr. She wanted to extend her thanks and let you know how grateful they were for all you did. Your professionalism and they way you treated her with respect and took such time and care with her will be forever appreciated.

## 2023-01-11 NOTE — Telephone Encounter (Signed)
Called daughter Forde Radon to express condolence Left message saying I will call back
